# Patient Record
Sex: Male | Born: 1955 | Race: White | Hispanic: No | Smoking: Former smoker
Health system: Southern US, Community
[De-identification: ages and names within clinical notes are randomized; demographics above are authoritative.]

## PROBLEM LIST (undated history)

## (undated) DIAGNOSIS — J449 Chronic obstructive pulmonary disease, unspecified: Secondary | ICD-10-CM

## (undated) DIAGNOSIS — K219 Gastro-esophageal reflux disease without esophagitis: Secondary | ICD-10-CM

## (undated) DIAGNOSIS — C801 Malignant (primary) neoplasm, unspecified: Secondary | ICD-10-CM

## (undated) DIAGNOSIS — E785 Hyperlipidemia, unspecified: Secondary | ICD-10-CM

## (undated) DIAGNOSIS — I10 Essential (primary) hypertension: Secondary | ICD-10-CM

## (undated) DIAGNOSIS — J8410 Pulmonary fibrosis, unspecified: Secondary | ICD-10-CM

## (undated) HISTORY — PX: COLONOSCOPY: SHX174

## (undated) HISTORY — PX: CORONARY STENT PLACEMENT: SHX1402

## (undated) HISTORY — PX: SHOULDER ACROMIOPLASTY: SHX6093

---

## 2004-03-03 ENCOUNTER — Other Ambulatory Visit: Payer: Self-pay

## 2005-05-08 ENCOUNTER — Ambulatory Visit: Payer: Self-pay | Admitting: Internal Medicine

## 2005-08-04 ENCOUNTER — Emergency Department: Payer: Self-pay | Admitting: Emergency Medicine

## 2005-08-04 ENCOUNTER — Other Ambulatory Visit: Payer: Self-pay

## 2005-09-08 ENCOUNTER — Ambulatory Visit: Payer: Self-pay | Admitting: Gastroenterology

## 2006-05-07 ENCOUNTER — Ambulatory Visit: Payer: Self-pay | Admitting: Gastroenterology

## 2006-05-21 ENCOUNTER — Ambulatory Visit: Payer: Self-pay | Admitting: Gastroenterology

## 2007-11-30 ENCOUNTER — Ambulatory Visit: Payer: Self-pay | Admitting: Gastroenterology

## 2007-12-09 ENCOUNTER — Ambulatory Visit: Payer: Self-pay | Admitting: Gastroenterology

## 2008-02-20 ENCOUNTER — Emergency Department: Payer: Self-pay | Admitting: Emergency Medicine

## 2009-03-05 ENCOUNTER — Ambulatory Visit: Payer: Self-pay | Admitting: Unknown Physician Specialty

## 2009-04-17 ENCOUNTER — Ambulatory Visit: Payer: Self-pay | Admitting: Internal Medicine

## 2011-04-26 ENCOUNTER — Emergency Department: Payer: Self-pay | Admitting: Internal Medicine

## 2011-04-27 ENCOUNTER — Emergency Department: Payer: Self-pay | Admitting: Emergency Medicine

## 2012-06-27 ENCOUNTER — Emergency Department: Payer: Self-pay | Admitting: Emergency Medicine

## 2012-06-27 LAB — BASIC METABOLIC PANEL
BUN: 9 mg/dL (ref 7–18)
Calcium, Total: 9.2 mg/dL (ref 8.5–10.1)
EGFR (Non-African Amer.): >60
Glucose: 119 mg/dL — ABNORMAL HIGH (ref 65–99)
Osmolality: 266 (ref 275–301)
Potassium: 4.2 mmol/L (ref 3.5–5.1)
Sodium: 133 mmol/L — ABNORMAL LOW (ref 136–145)

## 2012-06-27 LAB — CBC
HGB: 12.2 g/dL — ABNORMAL LOW (ref 13.0–18.0)
MCH: 26.6 pg (ref 26.0–34.0)
MCHC: 33.8 g/dL (ref 32.0–36.0)
MCV: 79 fL — ABNORMAL LOW (ref 80–100)
RBC: 4.6 10*6/uL (ref 4.40–5.90)

## 2012-06-27 LAB — CK TOTAL AND CKMB (NOT AT ARMC): CK-MB: 1.2 ng/mL (ref 0.5–3.6)

## 2012-06-27 LAB — TROPONIN I: Troponin-I: <0.02 ng/mL

## 2014-02-09 ENCOUNTER — Emergency Department: Payer: Self-pay | Admitting: Emergency Medicine

## 2014-12-04 ENCOUNTER — Inpatient Hospital Stay: Payer: Self-pay | Admitting: Internal Medicine

## 2015-01-17 ENCOUNTER — Ambulatory Visit
Admit: 2015-01-17 | Disposition: A | Discharge status: Home / Self Care | Payer: Self-pay | Attending: Family Medicine | Admitting: Family Medicine

## 2015-01-23 ENCOUNTER — Ambulatory Visit
Admit: 2015-01-23 | Disposition: A | Discharge status: Home / Self Care | Payer: Self-pay | Attending: Family Medicine | Admitting: Family Medicine

## 2015-01-28 ENCOUNTER — Ambulatory Visit
Admit: 2015-01-28 | Disposition: A | Discharge status: Home / Self Care | Payer: Self-pay | Attending: Family Medicine | Admitting: Family Medicine

## 2015-02-17 NOTE — H&P (Signed)
PATIENT NAME:  Austin Patterson, Austin Patterson MR#:  [id number] DATE OF BIRTH:  12/17/1955  DATE OF ADMISSION:  12/04/2014  REFERRING PHYSICIAN:  Valli Glance. Owens Shark, MD  PRIMARY CARE PHYSICIAN:  Estell Harpin, NP  CHIEF COMPLAINT:  Shortness of breath.   HISTORY OF PRESENT ILLNESS:  This is a 59 year old Caucasian gentleman with a history of coronary artery disease status post PCI with stent placement, gastroesophageal reflux disease without esophagitis, and hypertension, essential, presenting with shortness of breath. He describes one-day duration of shortness of breath, mainly dyspnea on exertion with associated cough productive of "shitty-looking sputum." Denies fevers, chills, or further symptomatology. He is tachypneic and tachycardic upon arrival.   REVIEW OF SYSTEMS: CONSTITUTIONAL:  Denies fever; however, positive for chills, fatigue, and weakness.  EYES:  Denies blurred vision, double vision, or eye pain.  EARS, NOSE, AND THROAT:  Denies tinnitus, ear pain, or hearing loss.  RESPIRATORY:  Positive for cough and shortness of breath as described above.  CARDIOVASCULAR:  Denies chest pain, palpitations, or edema. GASTROINTESTINAL:  Denies any nausea, vomiting, diarrhea, or abdominal pain.  GENITOURINARY:  Denies dysuria or hematuria.  ENDOCRINE:  Denies nocturia or thyroid problems.  HEMATOLOGIC AND LYMPHATIC:  Denies easy bruising or bleeding.  SKIN:  Denies rash or lesion.  MUSCULOSKELETAL:  Denies pain in the neck, back, shoulders, knees, or hips or arthritic symptoms.  NEUROLOGIC:  Denies paralysis or paresthesias.  PSYCHIATRIC:  Denies anxiety or depressive symptoms.   Otherwise, full review of systems performed by me is negative.  PAST MEDICAL HISTORY:  Coronary artery disease status post PCI with stent placement, gastroesophageal reflux disease without esophagitis, and hypertension, essential.   SOCIAL HISTORY:  Positive for continued everyday tobacco use. Occasional alcohol use. Denies  any drug use.   FAMILY HISTORY:  Positive for coronary artery disease.   ALLERGIES:  No known drug allergies.   HOME MEDICATIONS:  Include atorvastatin 20 mg p.o. daily, hydrochlorothiazide/losartan 12.5/100 mg p.o. daily, Plavix 75 mg p.o. daily, Toprol-XL 50 mg p.o. daily, Norvasc 10 mg p.o. daily, and pantoprazole 40 mg p.o. daily.   PHYSICAL EXAMINATION: VITAL SIGNS:  Temperature 97.9, heart rate 114, respirations 22, blood pressure 147/79, and saturating 94% on supplemental O2. Weight 76.2 kg, BMI 26.3.  GENERAL:  Well-nourished, well-developed Caucasian gentleman currently in no acute distress.  HEAD:  Normocephalic, atraumatic.  EYES:  Pupils are equal, round, and reactive to light. Extraocular muscles are intact. No scleral icterus.  MOUTH:  Moist mucosal membranes. Dentition is intact. No abscess noted.  EARS, NOSE, AND THROAT:  Clear without exudate. No external lesions.  NECK:  Supple. No thyromegaly. No nodules. No JVD.  PULMONARY:  Coarse breath sounds with scattered rhonchi, left lower; otherwise, no frank wheezes noted. Good air entry bilaterally. Good respiratory effort.  CHEST:  Nontender to palpation.  CARDIOVASCULAR:  S1 and S2, tachycardic. No murmurs, rubs, or gallops. No edema. Pedal pulses 2+ bilaterally.  GASTROINTESTINAL:  Soft, nontender, nondistended. No masses. Positive bowel sounds. No hepatosplenomegaly.  MUSCULOSKELETAL:  No swelling, clubbing, or edema. Range of motion is full in all extremities.  NEUROLOGIC:  Cranial nerves II through XII are intact. No gross focal neurological deficits. Sensation is intact. Reflexes are intact.  SKIN:  No ulceration, lesion, rash, or cyanosis. Skin is warm and dry. Turgor is intact.  PSYCHIATRIC:  Mood and affect are within normal limits. The patient is awake, alert, and oriented x 3. Insight and judgment are intact.   LABORATORY DATA:  Sodium 130, potassium  2.9, chloride 95, bicarbonate 22, BUN 10, creatinine 1.05, glucose  117. WBC 21.8, hemoglobin 12.2, platelets 334,000. CT of the abdomen and pelvis was performed showing no acute findings. Chest x-ray performed, which reveals lingular airspace disease consistent with pneumonia.   ASSESSMENT AND PLAN:  This is a 59 year old Caucasian gentleman with a history of coronary artery disease status post percutaneous coronary intervention with stent placement and hypertension, presenting with shortness of breath.   1.  Sepsis, meeting septic criteria by heart rate, respiratory rate, and leukocytosis present on arrival secondary to community-acquired pneumonia. Blood culture as well as sputum sample. DuoNeb treatments q. 4 hours. Supplemental O2 to keep SaO2 greater than 90%. Levaquin for antibiotic coverage. Intravenous fluid hydration to keep mean arterial pressure greater than 65.  2.  Hyponatremia. Intravenous fluid hydration. Follow sodium level.  3.  Hypokalemia. Replace with goal 4 to 5. We will also check a magnesium level.  4.  Hypertension, essential. Continue with Norvasc as well as Toprol-XL.  5.  Coronary artery disease status post percutaneous coronary intervention with stent placement. Continue aspirin, statin, and Plavix as well as beta blockade.  6.  Venous thromboembolism prophylaxis with heparin subcutaneously.   CODE STATUS:  The patient is a full code.   TIME SPENT:  45 minutes.    ____________________________ Aaron Mose. Hower, MD dkh:nb D: 12/04/2014 02:48:24 ET T: 12/04/2014 03:10:44 ET JOB#: [id number]  cc: Aaron Mose. Hower, MD, <Dictator> DAVID Woodfin Ganja MD ELECTRONICALLY SIGNED 12/04/2014 20:36

## 2015-02-17 NOTE — Discharge Summary (Signed)
PATIENT NAME:  Austin Patterson, Austin Patterson MR#:  [id number] DATE OF BIRTH:  1956/05/18  DATE OF ADMISSION:  12/04/2014 DATE OF DISCHARGE:  12/05/2014  PRIMARY CARE PROVIDER: Estell Harpin, NP   ADMITTING COMPLAINT: Shortness of breath.   DISCHARGE DIAGNOSES: 1. Community-acquired pneumonia.  2. Ongoing tobacco abuse.  3. Hypertension.  4. Coronary artery disease.  5. Sepsis, resolved on discharge  6. Hypokalemia, resolved by discharge.  7. History of coronary artery disease, stable throughout hospitalization.  8. Likely chronic obstructive pulmonary disease.  9. Adrenal nodules seen on CT scan, which need follow up with CT in 12 months.  10. Left upper lobe nodular opacity seen on chest x-ray, which will need follow-up in 4-6 weeks, as well   CONSULTATIONS: None.   PROCEDURES:  1. CT abdomen and pelvis with contrast, February 16, shows no acute findings in the abdomen or pelvis. Low attenuation adrenal nodules bilaterally, more likely benign.  2. Chest x-ray shows lingular airspace disease consistent with pneumonia. Nodular opacity in left upper lung zone, which will need follow-up on further films, changes suggestive of emphysema.   HISTORY OF PRESENT ILLNESS: This 59 year old gentleman with history of coronary artery disease, status post PCI with stent placement, gastroesophageal reflux disease, hypertension, presents with shortness of breath. He describes 1-day duration of symptoms, dyspnea on exertion and cough productive of sputum. No fevers or chills.   HOSPITAL COURSE BY PROBLEM:  1. Sepsis: The patient metastatic septic criteria with heart rate, respiratory rate, and leukocytosis. This is due to community-acquired pneumonia. Blood cultures were negative. Sputum culture is not obtained. He was started on IV Levaquin and IV fluids on admission and did well. He is discharged on oral Levaquin, sepsis resolved, with normalization of his heart rate to 89 at resting and a very robust blood  pressure. Negative orthostatics prior to discharge.  2. Community-acquired pneumonia: Treated with Levaquin. At the time of discharge, white count has decreased considerably and he is afebrile. He will continue a 7-day course of Levaquin.  3. Left upper lobe nodule seen on chest x-ray: This should be followed with repeat films in 4-6 weeks and should be monitored by his primary care physician. He is at high risk for bronchogenic carcinoma given his smoking history.  4. Coronary artery disease: This was stable throughout hospitalization. He did not have chest pain. Cardiac enzymes were negative. No EKG changes seen. He continues on Plavix, statin, metoprolol, and blood pressure control with hydrochlorothiazide/losartan. He is advised to quit smoking.  5. Adrenal nodule seen on CT scan, incidental finding. Likely benign, but warrants follow-up with a repeat CT scan in 6 months.  6. Ongoing tobacco abuse: The patient was counseled regarding tobacco abuse. He states that he is ready to quit smoking, and he should continue to work with his primary care physician on this.  7. Hypertension: The blood pressure is fairly well controlled in hospital, and he is discharged on his home regimen,   DISCHARGE MEDICATIONS: 1. Atorvastatin 20 mg 1 tablet daily.  2. Hydrochlorothiazide/losartan 12.5 mg/100 mg 1 tablet once a day.  3. Clopidogrel 75 mg 1 tablet once a day.  4. Pantoprazole 40 mg 1 tablet once a day.  5. Norvasc 10 mg 1 tablet once a day.  6. Metoprolol succinate 50 mg 1 tablet once a day.  7. Levaquin 500 mg 1 tablet once a day to complete a 7-day course.   DISCHARGE PHYSICAL EXAMINATION: VITAL SIGNS: Temperature 98.1, pulse 89, respirations 20, blood pressure 151/88. Orthostatic  vital signs are negative. Oxygenation 98% at rest, 94% with exertion on room air.  GENERAL: No acute distress.  HEENT: Pupils equal, round, and reactive to light. Conjunctivae clear. Extraocular motion intact. Oral mucous  membranes pink and moist. Posterior oropharynx is clear.  RESPIRATORY: There are scattered bibasilar crackles. No wheezes, rhonchi, or rales. Good air movement. No respiratory distress.  CARDIOVASCULAR: Regular rate and rhythm. No murmurs, rubs, or gallops. No peripheral edema. Peripheral pulses 2+.  PSYCHIATRIC: The patient alert and oriented x 4 with good insight into clinical condition. No signs of uncontrolled depression or anxiety.   DISCHARGE LABORATORY DATA: Sodium 130, potassium 4.0, chloride 98, bicarbonate 29, BUN 8, creatinine 0.64, glucose 120. Total protein 6.0, albumin 3.0. LFTs normal. Troponin less than 0.02. White blood cell count initially 21.8, decreased to 14 after treatment; hemoglobin 11.4, platelets 341, 000. MCV is 98. Blood cultures: No growth x 2.   CONDITION ON DISCHARGE: Stable.   DISPOSITION: The patient is discharged to home with no further home health needs. He does not need home health oxygen.   DIET: Heart healthy diet.   ACTIVITY: As tolerated.   FOLLOWUP: Please follow up within 1-2 weeks with your primary care physician.   TIME SPENT ON DISCHARGE: 40 minutes.     ____________________________ Earleen Newport. Volanda Napoleon, MD cpw:mw D: 12/11/2014 08:50:37 ET T: 12/11/2014 11:50:30 ET JOB#: [id number]  cc: Barnetta Chapel P. Volanda Napoleon, MD, <Dictator> Estell Harpin, NP Aldean Jewett MD ELECTRONICALLY SIGNED 12/24/2014 10:42

## 2015-09-03 ENCOUNTER — Emergency Department: Payer: Managed Care, Other (non HMO)

## 2015-09-03 ENCOUNTER — Emergency Department
Admission: EM | Admit: 2015-09-03 | Discharge: 2015-09-03 | Disposition: A | Discharge status: Home / Self Care | Payer: Managed Care, Other (non HMO) | Attending: Emergency Medicine | Admitting: Emergency Medicine

## 2015-09-03 ENCOUNTER — Encounter: Payer: Self-pay | Admitting: Emergency Medicine

## 2015-09-03 DIAGNOSIS — R079 Chest pain, unspecified: Secondary | ICD-10-CM | POA: Diagnosis not present

## 2015-09-03 DIAGNOSIS — F17200 Nicotine dependence, unspecified, uncomplicated: Secondary | ICD-10-CM | POA: Insufficient documentation

## 2015-09-03 DIAGNOSIS — J441 Chronic obstructive pulmonary disease with (acute) exacerbation: Secondary | ICD-10-CM

## 2015-09-03 DIAGNOSIS — I10 Essential (primary) hypertension: Secondary | ICD-10-CM | POA: Diagnosis not present

## 2015-09-03 HISTORY — DX: Hyperlipidemia, unspecified: E78.5

## 2015-09-03 HISTORY — DX: Essential (primary) hypertension: I10

## 2015-09-03 LAB — COMPREHENSIVE METABOLIC PANEL
ALT: 22 U/L (ref 17–63)
AST: 28 U/L (ref 15–41)
Albumin: 4.3 g/dL (ref 3.5–5.0)
Alkaline Phosphatase: 56 U/L (ref 38–126)
Anion gap: 10 (ref 5–15)
BUN: 13 mg/dL (ref 6–20)
CHLORIDE: 98 mmol/L — AB (ref 101–111)
CO2: 25 mmol/L (ref 22–32)
Calcium: 9.2 mg/dL (ref 8.9–10.3)
Creatinine, Ser: 0.68 mg/dL (ref 0.61–1.24)
GFR calc Af Amer: >60 mL/min (ref >60)
GFR calc non Af Amer: >60 mL/min (ref >60)
GLUCOSE: 142 mg/dL — AB (ref 65–99)
POTASSIUM: 3.9 mmol/L (ref 3.5–5.1)
Sodium: 133 mmol/L — ABNORMAL LOW (ref 135–145)
Total Bilirubin: 1 mg/dL (ref 0.3–1.2)
Total Protein: 7.8 g/dL (ref 6.5–8.1)

## 2015-09-03 LAB — CBC WITH DIFFERENTIAL/PLATELET
Basophils Absolute: 0 10*3/uL (ref 0–0.1)
Basophils Relative: 0 %
Eosinophils Absolute: 0.2 10*3/uL (ref 0–0.7)
Eosinophils Relative: 2 %
HEMATOCRIT: 41.3 % (ref 40.0–52.0)
HEMOGLOBIN: 14 g/dL (ref 13.0–18.0)
LYMPHS PCT: 8 %
Lymphs Abs: 1 10*3/uL (ref 1.0–3.6)
MCH: 33.1 pg (ref 26.0–34.0)
MCHC: 34 g/dL (ref 32.0–36.0)
MCV: 97.5 fL (ref 80.0–100.0)
MONO ABS: 1.1 10*3/uL — AB (ref 0.2–1.0)
MONOS PCT: 9 %
NEUTROS ABS: 9.8 10*3/uL — AB (ref 1.4–6.5)
Neutrophils Relative %: 81 %
Platelets: 351 10*3/uL (ref 150–440)
RBC: 4.23 MIL/uL — ABNORMAL LOW (ref 4.40–5.90)
RDW: 16 % — AB (ref 11.5–14.5)
WBC: 12.2 10*3/uL — ABNORMAL HIGH (ref 3.8–10.6)

## 2015-09-03 LAB — TROPONIN I: Troponin I: <0.03 ng/mL (ref <0.031)

## 2015-09-03 MED ORDER — ALBUTEROL SULFATE HFA 108 (90 BASE) MCG/ACT IN AERS: 2.0000 | INHALATION_SPRAY | Freq: Four times a day (QID) | RESPIRATORY_TRACT | Status: DC | PRN

## 2015-09-03 MED ORDER — PREDNISONE 20 MG PO TABS: 40.0000 mg | ORAL_TABLET | Freq: Every day | ORAL | Status: DC

## 2015-09-03 MED ORDER — IPRATROPIUM-ALBUTEROL 0.5-2.5 (3) MG/3ML IN SOLN
3.0000 mL | Freq: Once | RESPIRATORY_TRACT | Status: AC
  Administered 2015-09-03: 3 mL via RESPIRATORY_TRACT
  Filled 2015-09-03: qty 3

## 2015-09-03 MED ORDER — METHYLPREDNISOLONE SODIUM SUCC 125 MG IJ SOLR
125.0000 mg | Freq: Once | INTRAMUSCULAR | Status: AC
  Administered 2015-09-03: 125 mg via INTRAVENOUS
  Filled 2015-09-03: qty 2

## 2015-09-03 MED ORDER — AZITHROMYCIN 250 MG PO TABS: ORAL_TABLET | ORAL | Status: AC

## 2015-09-03 NOTE — ED Provider Notes (Signed)
[facility] Emergency Department Provider Note  Time seen: 7:25 AM  I have reviewed the triage vital signs and the nursing notes.   HISTORY  Chief Complaint Chest Pain    HPI Austin Patterson is a 59 y.o. male with a past medical history of hypertension, hyperlipidemia, CAD status post stent 2009, presents with chest pain. According to the patient for the past 1 month history of having intermittent central chest pain. He states for the past 2 weeks he has also had a cough with some intermittent shortness of breath which feels like "pneumonia." The patient smokes a pack a day and states he has been more short of breath with wheeze recently. Denies home oxygen use. Describes his chest pain as mild, intermittent, gone currently. Dull/aching quality. Denies leg pain or swelling.     Past Medical History  Diagnosis Date  . Hypertension   . Hyperlipidemia     There are no active problems to display for this patient.   Past Surgical History  Procedure Laterality Date  . Coronary stent placement      No current outpatient prescriptions on file.  Allergies Review of patient's allergies indicates no known allergies.  History reviewed. No pertinent family history.  Social History Social History  Substance Use Topics  . Smoking status: Current Every Day Smoker  . Smokeless tobacco: None  . Alcohol Use: Yes    Review of Systems Constitutional: Negative for fever Cardiovascular: Positive intermittent mild chest pain. Respiratory: Negative for shortness of breath. Positive for cough 2 weeks. Gastrointestinal: Negative for abdominal pain, vomiting and diarrhea. Musculoskeletal: Negative for back pain. Denies leg pain or swelling. Neurological: Negative for headaches, focal weakness or numbness. 10-point ROS otherwise negative.  ____________________________________________   PHYSICAL EXAM:  VITAL SIGNS: ED Triage Vitals  Enc Vitals Group   BP 09/03/15 0704 154/79 mmHg     Pulse Rate 09/03/15 0704 101     Resp 09/03/15 0704 20     Temp 09/03/15 0704 98.1 F (36.7 C)     Temp Source 09/03/15 0704 Oral     SpO2 09/03/15 0704 93 %     Weight 09/03/15 0704 160 lb (72.576 kg)     Height 09/03/15 0704 5\' 7"  (1.702 m)     Head Cir --      Peak Flow --      Pain Score 09/03/15 0705 4     Pain Loc --      Pain Edu? --      Excl. in [place]? --    Constitutional: Alert and oriented. Well appearing and in no distress. Eyes: Normal exam ENT   Head: Normocephalic and atraumatic.   Mouth/Throat: Mucous membranes are moist. Cardiovascular: Normal rate, regular rhythm. No murmur Respiratory: Normal respiratory effort without tachypnea nor retractions. Moderate expiratory wheeze bilaterally. No rales or rhonchi. Gastrointestinal: Soft and nontender. No distention.  Musculoskeletal: Nontender with normal range of motion in all extremities.  Neurologic:  Normal speech and language. No gross focal neurologic deficits Skin:  Skin is warm, dry and intact.  Psychiatric: Mood and affect are normal. Speech and behavior are normal.   ____________________________________________    EKG  EKG reviewed and interpreted by myself shows normal sinus rhythm at 98 bpm, narrow QRS, normal axis, normal intervals, nonspecific ST changes present. No ST elevations noted.  ____________________________________________    RADIOLOGY  Chest x-ray shows no acute abnormality.  ____________________________________________   INITIAL IMPRESSION / ASSESSMENT AND PLAN / ED COURSE  Pertinent labs & imaging results that were available during my care of the patient were reviewed by me and considered in my medical decision making (see chart for details).  Patient presents with 1 month of intermittent chest pain, 2 weeks of cough, shortness of breath and wheeze. Exam is most consistent with a COPD exacerbation. We will start doing them's, Solu-Medrol,  obtain labs and a chest x-ray. Patient currently with a heart rate around 100 bpm and a pulse ox of 93% on room air. EKG shows nonspecific changes.  Chest x-ray clear. Labs are within normal limits besides a mild leukocytosis. Troponin is negative. Patient feeling much better status post nebulizer treatments. Patient likely with COPD exacerbation causing his symptoms. I discussed with the patient steroids and albuterol at home along with a Z-Pak. Patient is to follow-up with Dr. Nehemiah Massed in the next week. Patient is agreeable to plan.  ____________________________________________   FINAL CLINICAL IMPRESSION(S) / ED DIAGNOSES  Chest pain COPD exacerbation   Harvest Dark, MD 09/03/15 [phone]

## 2015-09-03 NOTE — ED Notes (Signed)
Pt to ed with c/o intermittent chest pain for the last month,  Reports sob, nausea, and dizziness associated with the pain.  Pt reports pain is in central chest.

## 2015-09-03 NOTE — Discharge Instructions (Signed)
Please take your medications as prescribed. Please follow-up with Dr. Nehemiah Massed by calling the number provided for a follow-up appointment as soon as possible. Return to the emergency department for any worsening chest pain, shortness breath, or any other symptom personally concerning to yourself.   Nonspecific Chest Pain  Chest pain can be caused by many different conditions. There is always a chance that your pain could be related to something serious, such as a heart attack or a blood clot in your lungs. Chest pain can also be caused by conditions that are not life-threatening. If you have chest pain, it is very important to follow up with your health care provider. CAUSES  Chest pain can be caused by:  Heartburn.  Pneumonia or bronchitis.  Anxiety or stress.  Inflammation around your heart (pericarditis) or lung (pleuritis or pleurisy).  A blood clot in your lung.  A collapsed lung (pneumothorax). It can develop suddenly on its own (spontaneous pneumothorax) or from trauma to the chest.  Shingles infection (varicella-zoster virus).  Heart attack.  Damage to the bones, muscles, and cartilage that make up your chest wall. This can include:  Bruised bones due to injury.  Strained muscles or cartilage due to frequent or repeated coughing or overwork.  Fracture to one or more ribs.  Sore cartilage due to inflammation (costochondritis). RISK FACTORS  Risk factors for chest pain may include:  Activities that increase your risk for trauma or injury to your chest.  Respiratory infections or conditions that cause frequent coughing.  Medical conditions or overeating that can cause heartburn.  Heart disease or family history of heart disease.  Conditions or health behaviors that increase your risk of developing a blood clot.  Having had chicken pox (varicella zoster). SIGNS AND SYMPTOMS Chest pain can feel like:  Burning or tingling on the surface of your chest or deep in  your chest.  Crushing, pressure, aching, or squeezing pain.  Dull or sharp pain that is worse when you move, cough, or take a deep breath.  Pain that is also felt in your back, neck, shoulder, or arm, or pain that spreads to any of these areas. Your chest pain may come and go, or it may stay constant. DIAGNOSIS Lab tests or other studies may be needed to find the cause of your pain. Your health care provider may have you take a test called an ambulatory ECG (electrocardiogram). An ECG records your heartbeat patterns at the time the test is performed. You may also have other tests, such as:  Transthoracic echocardiogram (TTE). During echocardiography, sound waves are used to create a picture of all of the heart structures and to look at how blood flows through your heart.  Transesophageal echocardiogram (TEE).This is a more advanced imaging test that obtains images from inside your body. It allows your health care provider to see your heart in finer detail.  Cardiac monitoring. This allows your health care provider to monitor your heart rate and rhythm in real time.  Holter monitor. This is a portable device that records your heartbeat and can help to diagnose abnormal heartbeats. It allows your health care provider to track your heart activity for several days, if needed.  Stress tests. These can be done through exercise or by taking medicine that makes your heart beat more quickly.  Blood tests.  Imaging tests. TREATMENT  Your treatment depends on what is causing your chest pain. Treatment may include:  Medicines. These may include:  Acid blockers for heartburn.  Anti-inflammatory  medicine.  Pain medicine for inflammatory conditions.  Antibiotic medicine, if an infection is present.  Medicines to dissolve blood clots.  Medicines to treat coronary artery disease.  Supportive care for conditions that do not require medicines. This may include:  Resting.  Applying heat or  cold packs to injured areas.  Limiting activities until pain decreases. HOME CARE INSTRUCTIONS 1. If you were prescribed an antibiotic medicine, finish it all even if you start to feel better. 2. Avoid any activities that bring on chest pain. 3. Do not use any tobacco products, including cigarettes, chewing tobacco, or electronic cigarettes. If you need help quitting, ask your health care provider. 4. Do not drink alcohol. 5. Take medicines only as directed by your health care provider. 6. Keep all follow-up visits as directed by your health care provider. This is important. This includes any further testing if your chest pain does not go away. 7. If heartburn is the cause for your chest pain, you may be told to keep your head raised (elevated) while sleeping. This reduces the chance that acid will go from your stomach into your esophagus. 8. Make lifestyle changes as directed by your health care provider. These may include: 1. Getting regular exercise. Ask your health care provider to suggest some activities that are safe for you. 2. Eating a heart-healthy diet. A registered dietitian can help you to learn healthy eating options. 3. Maintaining a healthy weight. 4. Managing diabetes, if necessary. 5. Reducing stress. SEEK MEDICAL CARE IF:  Your chest pain does not go away after treatment.  You have a rash with blisters on your chest.  You have a fever. SEEK IMMEDIATE MEDICAL CARE IF:   Your chest pain is worse.  You have an increasing cough, or you cough up blood.  You have severe abdominal pain.  You have severe weakness.  You faint.  You have chills.  You have sudden, unexplained chest discomfort.  You have sudden, unexplained discomfort in your arms, back, neck, or jaw.  You have shortness of breath at any time.  You suddenly start to sweat, or your skin gets clammy.  You feel nauseous or you vomit.  You suddenly feel light-headed or dizzy.  Your heart begins to  beat quickly, or it feels like it is skipping beats. These symptoms may represent a serious problem that is an emergency. Do not wait to see if the symptoms will go away. Get medical help right away. Call your local emergency services (911 in the [place] Do not drive yourself to the hospital.   This information is not intended to replace advice given to you by your health care provider. Make sure you discuss any questions you have with your health care provider.   Document Released: 07/15/2005 Document Revised: 10/26/2014 Document Reviewed: 05/11/2014 Elsevier Interactive Patient Education 2016 [employer]  Chronic Obstructive Pulmonary Disease Chronic obstructive pulmonary disease (COPD) is a common lung condition in which airflow from the lungs is limited. COPD is a general term that can be used to describe many different lung problems that limit airflow, including both chronic bronchitis and emphysema. If you have COPD, your lung function will probably never return to normal, but there are measures you can take to improve lung function and make yourself feel better. CAUSES   Smoking (common).  Exposure to secondhand smoke.  Genetic problems.  Chronic inflammatory lung diseases or recurrent infections. SYMPTOMS  Shortness of breath, especially with physical activity.  Deep, persistent (chronic) cough with a large  amount of thick mucus.  Wheezing.  Rapid breaths (tachypnea).  Gray or bluish discoloration (cyanosis) of the skin, especially in your fingers, toes, or lips.  Fatigue.  Weight loss.  Frequent infections or episodes when breathing symptoms become much worse (exacerbations).  Chest tightness. DIAGNOSIS Your health care provider will take a medical history and perform a physical examination to diagnose COPD. Additional tests for COPD may include:  Lung (pulmonary) function tests.  Chest X-ray.  CT scan.  Blood tests. TREATMENT  Treatment for COPD may  include:  Inhaler and nebulizer medicines. These help manage the symptoms of COPD and make your breathing more comfortable.  Supplemental oxygen. Supplemental oxygen is only helpful if you have a low oxygen level in your blood.  Exercise and physical activity. These are beneficial for nearly all people with COPD.  Lung surgery or transplant.  Nutrition therapy to gain weight, if you are underweight.  Pulmonary rehabilitation. This may involve working with a team of health care providers and specialists, such as respiratory, occupational, and physical therapists. HOME CARE INSTRUCTIONS  Take all medicines (inhaled or pills) as directed by your health care provider.  Avoid over-the-counter medicines or cough syrups that dry up your airway (such as antihistamines) and slow down the elimination of secretions unless instructed otherwise by your health care provider.  If you are a smoker, the most important thing that you can do is stop smoking. Continuing to smoke will cause further lung damage and breathing trouble. Ask your health care provider for help with quitting smoking. He or she can direct you to community resources or hospitals that provide support.  Avoid exposure to irritants such as smoke, chemicals, and fumes that aggravate your breathing.  Use oxygen therapy and pulmonary rehabilitation if directed by your health care provider. If you require home oxygen therapy, ask your health care provider whether you should purchase a pulse oximeter to measure your oxygen level at home.  Avoid contact with individuals who have a contagious illness.  Avoid extreme temperature and humidity changes.  Eat healthy foods. Eating smaller, more frequent meals and resting before meals may help you maintain your strength.  Stay active, but balance activity with periods of rest. Exercise and physical activity will help you maintain your ability to do things you want to do.  Preventing infection  and hospitalization is very important when you have COPD. Make sure to receive all the vaccines your health care provider recommends, especially the pneumococcal and influenza vaccines. Ask your health care provider whether you need a pneumonia vaccine.  Learn and use relaxation techniques to manage stress.  Learn and use controlled breathing techniques as directed by your health care provider. Controlled breathing techniques include:  Pursed lip breathing. Start by breathing in (inhaling) through your nose for 1 second. Then, purse your lips as if you were going to whistle and breathe out (exhale) through the pursed lips for 2 seconds.  Diaphragmatic breathing. Start by putting one hand on your abdomen just above your waist. Inhale slowly through your nose. The hand on your abdomen should move out. Then purse your lips and exhale slowly. You should be able to feel the hand on your abdomen moving in as you exhale.  Learn and use controlled coughing to clear mucus from your lungs. Controlled coughing is a series of short, progressive coughs. The steps of controlled coughing are: 9. Lean your head slightly forward. 10. Breathe in deeply using diaphragmatic breathing. 11. Try to hold your breath for  3 seconds. 12. Keep your mouth slightly open while coughing twice. 13. Spit any mucus out into a tissue. 14. Rest and repeat the steps once or twice as needed. SEEK MEDICAL CARE IF:  You are coughing up more mucus than usual.  There is a change in the color or thickness of your mucus.  Your breathing is more labored than usual.  Your breathing is faster than usual. SEEK IMMEDIATE MEDICAL CARE IF:  You have shortness of breath while you are resting.  You have shortness of breath that prevents you from:  Being able to talk.  Performing your usual physical activities.  You have chest pain lasting longer than 5 minutes.  Your skin color is more cyanotic than usual.  You measure low oxygen  saturations for longer than 5 minutes with a pulse oximeter. MAKE SURE YOU:  Understand these instructions.  Will watch your condition.  Will get help right away if you are not doing well or get worse.   This information is not intended to replace advice given to you by your health care provider. Make sure you discuss any questions you have with your health care provider.   Document Released: 07/15/2005 Document Revised: 10/26/2014 Document Reviewed: 06/01/2013 Elsevier Interactive Patient Education [employer].

## 2015-10-14 ENCOUNTER — Inpatient Hospital Stay
Admission: EM | Admit: 2015-10-14 | Discharge: 2015-10-16 | DRG: 190 | Disposition: A | Discharge status: Home / Self Care | Payer: Managed Care, Other (non HMO) | Attending: Internal Medicine | Admitting: Internal Medicine

## 2015-10-14 ENCOUNTER — Emergency Department: Payer: Managed Care, Other (non HMO)

## 2015-10-14 DIAGNOSIS — J441 Chronic obstructive pulmonary disease with (acute) exacerbation: Principal | ICD-10-CM | POA: Diagnosis present

## 2015-10-14 DIAGNOSIS — F17210 Nicotine dependence, cigarettes, uncomplicated: Secondary | ICD-10-CM | POA: Diagnosis present

## 2015-10-14 DIAGNOSIS — Z7902 Long term (current) use of antithrombotics/antiplatelets: Secondary | ICD-10-CM | POA: Diagnosis not present

## 2015-10-14 DIAGNOSIS — J9601 Acute respiratory failure with hypoxia: Secondary | ICD-10-CM | POA: Diagnosis present

## 2015-10-14 DIAGNOSIS — Z955 Presence of coronary angioplasty implant and graft: Secondary | ICD-10-CM

## 2015-10-14 DIAGNOSIS — I10 Essential (primary) hypertension: Secondary | ICD-10-CM | POA: Diagnosis present

## 2015-10-14 DIAGNOSIS — E876 Hypokalemia: Secondary | ICD-10-CM | POA: Diagnosis present

## 2015-10-14 DIAGNOSIS — E785 Hyperlipidemia, unspecified: Secondary | ICD-10-CM | POA: Diagnosis present

## 2015-10-14 DIAGNOSIS — Z7982 Long term (current) use of aspirin: Secondary | ICD-10-CM

## 2015-10-14 DIAGNOSIS — Z23 Encounter for immunization: Secondary | ICD-10-CM

## 2015-10-14 DIAGNOSIS — E871 Hypo-osmolality and hyponatremia: Secondary | ICD-10-CM | POA: Diagnosis present

## 2015-10-14 DIAGNOSIS — Z79899 Other long term (current) drug therapy: Secondary | ICD-10-CM

## 2015-10-14 LAB — BASIC METABOLIC PANEL
Anion gap: 12 (ref 5–15)
BUN: 12 mg/dL (ref 6–20)
CO2: 27 mmol/L (ref 22–32)
Calcium: 8.8 mg/dL — ABNORMAL LOW (ref 8.9–10.3)
Chloride: 90 mmol/L — ABNORMAL LOW (ref 101–111)
Creatinine, Ser: 0.79 mg/dL (ref 0.61–1.24)
GFR calc Af Amer: >60 mL/min (ref >60)
GLUCOSE: 128 mg/dL — AB (ref 65–99)
POTASSIUM: 3.2 mmol/L — AB (ref 3.5–5.1)
Sodium: 129 mmol/L — ABNORMAL LOW (ref 135–145)

## 2015-10-14 LAB — CBC WITH DIFFERENTIAL/PLATELET
Basophils Absolute: 0.1 10*3/uL (ref 0–0.1)
Basophils Relative: 1 %
EOS PCT: 0 %
Eosinophils Absolute: 0 10*3/uL (ref 0–0.7)
HCT: 40.4 % (ref 40.0–52.0)
Hemoglobin: 14 g/dL (ref 13.0–18.0)
LYMPHS ABS: 0.6 10*3/uL — AB (ref 1.0–3.6)
LYMPHS PCT: 6 %
MCH: 33.5 pg (ref 26.0–34.0)
MCHC: 34.7 g/dL (ref 32.0–36.0)
MCV: 96.4 fL (ref 80.0–100.0)
MONO ABS: 0.9 10*3/uL (ref 0.2–1.0)
MONOS PCT: 9 %
Neutro Abs: 8.4 10*3/uL — ABNORMAL HIGH (ref 1.4–6.5)
Neutrophils Relative %: 84 %
PLATELETS: 268 10*3/uL (ref 150–440)
RBC: 4.19 MIL/uL — AB (ref 4.40–5.90)
RDW: 14.4 % (ref 11.5–14.5)
WBC: 10 10*3/uL (ref 3.8–10.6)

## 2015-10-14 LAB — BRAIN NATRIURETIC PEPTIDE: B Natriuretic Peptide: 43 pg/mL (ref 0.0–100.0)

## 2015-10-14 LAB — TROPONIN I: Troponin I: <0.03 ng/mL (ref <0.031)

## 2015-10-14 MED ORDER — ALBUTEROL SULFATE (2.5 MG/3ML) 0.083% IN NEBU
2.5000 mg | INHALATION_SOLUTION | RESPIRATORY_TRACT | Status: DC | PRN
Start: 2015-10-14 — End: 2015-10-16

## 2015-10-14 MED ORDER — ONDANSETRON HCL 4 MG/2ML IJ SOLN: 4.0000 mg | Freq: Four times a day (QID) | INTRAMUSCULAR | Status: DC | PRN

## 2015-10-14 MED ORDER — ACETAMINOPHEN 325 MG PO TABS
650.0000 mg | ORAL_TABLET | Freq: Four times a day (QID) | ORAL | Status: DC | PRN
  Administered 2015-10-14 – 2015-10-15 (×2): 650 mg via ORAL
  Filled 2015-10-14 (×2): qty 2

## 2015-10-14 MED ORDER — CLOPIDOGREL BISULFATE 75 MG PO TABS
75.0000 mg | ORAL_TABLET | Freq: Every day | ORAL | Status: DC
  Administered 2015-10-15 – 2015-10-16 (×2): 75 mg via ORAL
  Filled 2015-10-14 (×2): qty 1

## 2015-10-14 MED ORDER — LOSARTAN POTASSIUM 50 MG PO TABS
100.0000 mg | ORAL_TABLET | Freq: Every day | ORAL | Status: DC
  Administered 2015-10-16: 100 mg via ORAL
  Filled 2015-10-14 (×2): qty 2

## 2015-10-14 MED ORDER — ATORVASTATIN CALCIUM 20 MG PO TABS
40.0000 mg | ORAL_TABLET | Freq: Every day | ORAL | Status: DC
  Administered 2015-10-14 – 2015-10-15 (×2): 40 mg via ORAL
  Filled 2015-10-14 (×2): qty 2

## 2015-10-14 MED ORDER — NICOTINE 21 MG/24HR TD PT24
21.0000 mg | MEDICATED_PATCH | Freq: Every day | TRANSDERMAL | Status: DC
  Administered 2015-10-14 – 2015-10-16 (×3): 21 mg via TRANSDERMAL
  Filled 2015-10-14 (×3): qty 1

## 2015-10-14 MED ORDER — IPRATROPIUM-ALBUTEROL 0.5-2.5 (3) MG/3ML IN SOLN
3.0000 mL | Freq: Four times a day (QID) | RESPIRATORY_TRACT | Status: DC
  Administered 2015-10-14 – 2015-10-15 (×6): 3 mL via RESPIRATORY_TRACT
  Filled 2015-10-14 (×6): qty 3

## 2015-10-14 MED ORDER — PANTOPRAZOLE SODIUM 40 MG PO TBEC
40.0000 mg | DELAYED_RELEASE_TABLET | Freq: Every day | ORAL | Status: DC
  Administered 2015-10-15 – 2015-10-16 (×2): 40 mg via ORAL
  Filled 2015-10-14 (×2): qty 1

## 2015-10-14 MED ORDER — ENOXAPARIN SODIUM 40 MG/0.4ML ~~LOC~~ SOLN
40.0000 mg | SUBCUTANEOUS | Status: DC
  Administered 2015-10-14 – 2015-10-15 (×2): 40 mg via SUBCUTANEOUS
  Filled 2015-10-14 (×2): qty 0.4

## 2015-10-14 MED ORDER — LOSARTAN POTASSIUM-HCTZ 100-12.5 MG PO TABS: 1.0000 | ORAL_TABLET | Freq: Every day | ORAL | Status: DC

## 2015-10-14 MED ORDER — IPRATROPIUM-ALBUTEROL 0.5-2.5 (3) MG/3ML IN SOLN
3.0000 mL | Freq: Once | RESPIRATORY_TRACT | Status: AC
  Administered 2015-10-14: 3 mL via RESPIRATORY_TRACT
  Filled 2015-10-14: qty 3

## 2015-10-14 MED ORDER — ASPIRIN EC 81 MG PO TBEC
81.0000 mg | DELAYED_RELEASE_TABLET | Freq: Every day | ORAL | Status: DC
  Administered 2015-10-15 – 2015-10-16 (×2): 81 mg via ORAL
  Filled 2015-10-14 (×2): qty 1

## 2015-10-14 MED ORDER — POTASSIUM CHLORIDE 20 MEQ PO PACK
40.0000 meq | PACK | Freq: Once | ORAL | Status: AC
  Administered 2015-10-14: 40 meq via ORAL
  Filled 2015-10-14: qty 2

## 2015-10-14 MED ORDER — SODIUM CHLORIDE 0.9 % IV SOLN
INTRAVENOUS | Status: AC
  Administered 2015-10-14: 14:00:00 via INTRAVENOUS

## 2015-10-14 MED ORDER — SODIUM CHLORIDE 0.9 % IJ SOLN
3.0000 mL | Freq: Two times a day (BID) | INTRAMUSCULAR | Status: DC
  Administered 2015-10-14 – 2015-10-16 (×4): 3 mL via INTRAVENOUS

## 2015-10-14 MED ORDER — METHYLPREDNISOLONE SODIUM SUCC 125 MG IJ SOLR
125.0000 mg | Freq: Once | INTRAMUSCULAR | Status: AC
  Administered 2015-10-14: 125 mg via INTRAVENOUS
  Filled 2015-10-14: qty 2

## 2015-10-14 MED ORDER — ACETAMINOPHEN 650 MG RE SUPP: 650.0000 mg | Freq: Four times a day (QID) | RECTAL | Status: DC | PRN

## 2015-10-14 MED ORDER — AZITHROMYCIN 250 MG PO TABS
250.0000 mg | ORAL_TABLET | Freq: Every day | ORAL | Status: DC
  Administered 2015-10-15 – 2015-10-16 (×2): 250 mg via ORAL
  Filled 2015-10-14 (×2): qty 1

## 2015-10-14 MED ORDER — METOPROLOL SUCCINATE ER 50 MG PO TB24
50.0000 mg | ORAL_TABLET | Freq: Every day | ORAL | Status: DC
  Administered 2015-10-15 – 2015-10-16 (×2): 50 mg via ORAL
  Filled 2015-10-14 (×2): qty 1

## 2015-10-14 MED ORDER — INFLUENZA VAC SPLIT QUAD 0.5 ML IM SUSY
0.5000 mL | PREFILLED_SYRINGE | INTRAMUSCULAR | Status: AC
  Administered 2015-10-15: 0.5 mL via INTRAMUSCULAR
  Filled 2015-10-14: qty 0.5

## 2015-10-14 MED ORDER — DOCUSATE SODIUM 100 MG PO CAPS
100.0000 mg | ORAL_CAPSULE | Freq: Two times a day (BID) | ORAL | Status: DC
  Administered 2015-10-14 – 2015-10-15 (×2): 100 mg via ORAL
  Filled 2015-10-14 (×4): qty 1

## 2015-10-14 MED ORDER — AMLODIPINE BESYLATE 10 MG PO TABS
10.0000 mg | ORAL_TABLET | Freq: Every day | ORAL | Status: DC
  Administered 2015-10-15 – 2015-10-16 (×2): 10 mg via ORAL
  Filled 2015-10-14 (×2): qty 1

## 2015-10-14 MED ORDER — ONDANSETRON HCL 4 MG PO TABS: 4.0000 mg | ORAL_TABLET | Freq: Four times a day (QID) | ORAL | Status: DC | PRN

## 2015-10-14 MED ORDER — DEXTROSE 5 % IV SOLN
500.0000 mg | Freq: Once | INTRAVENOUS | Status: AC
  Administered 2015-10-14: 500 mg via INTRAVENOUS
  Filled 2015-10-14 (×3): qty 500

## 2015-10-14 MED ORDER — HYDROCHLOROTHIAZIDE 12.5 MG PO CAPS
12.5000 mg | ORAL_CAPSULE | Freq: Every day | ORAL | Status: DC
  Administered 2015-10-16: 12.5 mg via ORAL
  Filled 2015-10-14 (×2): qty 1

## 2015-10-14 MED ORDER — METHYLPREDNISOLONE SODIUM SUCC 125 MG IJ SOLR
60.0000 mg | Freq: Three times a day (TID) | INTRAMUSCULAR | Status: DC
  Administered 2015-10-14 – 2015-10-15 (×3): 60 mg via INTRAVENOUS
  Filled 2015-10-14 (×3): qty 2

## 2015-10-14 MED ORDER — PNEUMOCOCCAL VAC POLYVALENT 25 MCG/0.5ML IJ INJ
0.5000 mL | INJECTION | INTRAMUSCULAR | Status: AC
  Administered 2015-10-15: 0.5 mL via INTRAMUSCULAR
  Filled 2015-10-14: qty 0.5

## 2015-10-14 NOTE — ED Provider Notes (Signed)
[facility] Emergency Department Provider Note     Time seen: ----------------------------------------- 8:48 AM on 10/14/2015 -----------------------------------------    I have reviewed the triage vital signs and the nursing notes.   HISTORY  Chief Complaint No chief complaint on file.    HPI Austin Patterson is a 59 y.o. male brought the ER for shortness of breath. Patient states shortness of breath started this morning, has had wheezing and has never been this bad before. He is brought in tachycardic with hypoxia. Patient denies fever but has had chills, denies chest pain but has marked shortness of breath. Denies any other complaints.   Past Medical History  Diagnosis Date  . Hypertension   . Hyperlipidemia     There are no active problems to display for this patient.   Past Surgical History  Procedure Laterality Date  . Coronary stent placement      Allergies Review of patient's allergies indicates no known allergies.  Social History Social History  Substance Use Topics  . Smoking status: Current Every Day Smoker  . Smokeless tobacco: Not on file  . Alcohol Use: Yes    Review of Systems Constitutional: Negative for fever. Positive for chills Eyes: Negative for visual changes. ENT: Negative for sore throat. Cardiovascular: Negative for chest pain. Respiratory: Positive shortness of breath, positive for cough Gastrointestinal: Negative for abdominal pain, vomiting and diarrhea. Genitourinary: Negative for dysuria. Musculoskeletal: Negative for back pain. Skin: Negative for rash. Neurological: Negative for headaches, focal weakness or numbness.  10-point ROS otherwise negative.  ____________________________________________   PHYSICAL EXAM:  VITAL SIGNS: ED Triage Vitals  Enc Vitals Group     BP --      Pulse --      Resp --      Temp --      Temp src --      SpO2 --      Weight --      Height --      Head Cir  --      Peak Flow --      Pain Score --      Pain Loc --      Pain Edu? --      Excl. in [place]? --     Constitutional: Alert and oriented. Mild to moderate distress Eyes: Conjunctivae are normal. PERRL. Normal extraocular movements. ENT   Head: Normocephalic and atraumatic.   Nose: No congestion/rhinnorhea.   Mouth/Throat: Mucous membranes are moist.   Neck: No stridor. Cardiovascular: Normal rate, regular rhythm. Normal and symmetric distal pulses are present in all extremities. No murmurs, rubs, or gallops. Respiratory: Tachypnea, prolonged expirations, retractions, diminished breath sounds, wheezing bilaterally. Gastrointestinal: Soft and nontender. No distention. No abdominal bruits.  Musculoskeletal: Nontender with normal range of motion in all extremities. No joint effusions.  No lower extremity tenderness nor edema. Neurologic:  Normal speech and language. No gross focal neurologic deficits are appreciated. Speech is normal. No gait instability. Skin:  Skin is warm, dry and intact. No rash noted. Psychiatric: Mood and affect are normal. Speech and behavior are normal. Patient exhibits appropriate insight and judgment. ____________________________________________  EKG: Interpreted by me. Sinus tachycardia with a rate of 121 bpm, left anterior fascicular block, left axis deviation. No evidence of acute infarction  ____________________________________________  ED COURSE:  Pertinent labs & imaging results that were available during my care of the patient were reviewed by me and considered in my medical decision making (see chart for details). Patient  with apparent COPD exacerbation. He'll be given DuoNebs and steroids. ____________________________________________    LABS (pertinent positives/negatives)  Labs Reviewed  CBC WITH DIFFERENTIAL/PLATELET - Abnormal; Notable for the following:    RBC 4.19 (*)    Neutro Abs 8.4 (*)    Lymphs Abs 0.6 (*)    All other  components within normal limits  BASIC METABOLIC PANEL - Abnormal; Notable for the following:    Sodium 129 (*)    Potassium 3.2 (*)    Chloride 90 (*)    Glucose, Bld 128 (*)    Calcium 8.8 (*)    All other components within normal limits  BLOOD GAS, VENOUS - Abnormal; Notable for the following:    pH, Ven 7.45 (*)    pCO2, Ven 43 (*)    Bicarbonate 29.9 (*)    Acid-Base Excess 5.2 (*)    All other components within normal limits  BRAIN NATRIURETIC PEPTIDE  TROPONIN I    RADIOLOGY Images were viewed by me  Chest x-ray IMPRESSION: No infiltrate or pulmonary edema. Mild hyperinflation. Stable scarring in right base laterally and lingula. ____________________________________________  FINAL ASSESSMENT AND PLAN  COPD exacerbation, hyponatremia  Plan: Patient with labs and imaging as dictated above. Patient is in no acute distress, has mild hypoxia around 88% without any oxygen. He is still wheezing after 3 DuoNeb since steroids. I've advised observation for COPD exacerbation. He also has mild hyponatremia.   Earleen Newport, MD   Earleen Newport, MD 10/14/15 [id number]

## 2015-10-14 NOTE — H&P (Signed)
[facility] [place] NAME: Austin Patterson    MR#:  [id number]  DATE OF BIRTH:  11/28/1955  DATE OF ADMISSION:  10/14/2015  PRIMARY CARE PHYSICIAN: Lavonne Chick, MD   REQUESTING/REFERRING PHYSICIAN: Dr. Jimmye Norman  CHIEF COMPLAINT:  Shortness of breath  HISTORY OF PRESENT ILLNESS:  Austin Patterson  is a 59 y.o. male with a known history of COPD, hypertension and hyperlipidemia is presenting to the ED with a chief complaint of shortness of breath. Patient suddenly started having worsening of shortness of breath associated with chills but no fever. Patient was found to be hypoxic with pulse ox at 88% on room air. Chest x-ray with no acute infiltrate. Sodium and potassium was found to be low. Patient smokes but reports that he stop smoking since last night and wants to quit forever  PAST MEDICAL HISTORY:   Past Medical History  Diagnosis Date  . Hypertension   . Hyperlipidemia     PAST SURGICAL HISTOIRY:   Past Surgical History  Procedure Laterality Date  . Coronary stent placement    . Shoulder acromioplasty      SOCIAL HISTORY:   Social History  Substance Use Topics  . Smoking status: Current Every Day Smoker  . Smokeless tobacco: Not on file  . Alcohol Use: Yes     Comment: occasional     FAMILY HISTORY:  Hypertension runs in his family DRUG ALLERGIES:  No Known Allergies  REVIEW OF SYSTEMS:  CONSTITUTIONAL: No fever, but reporting chills. Denies fatigue or weakness.  EYES: No blurred or double vision.  EARS, NOSE, AND THROAT: No tinnitus or ear pain.  RESPIRATORY: Reports cough, shortness of breath, wheezing , denies hemoptysis.  CARDIOVASCULAR: No chest pain, orthopnea, edema.  GASTROINTESTINAL: No nausea, vomiting, diarrhea or abdominal pain.  GENITOURINARY: No dysuria, hematuria.  ENDOCRINE: No polyuria, nocturia,  HEMATOLOGY: No anemia, easy bruising or bleeding SKIN: No rash or  lesion. MUSCULOSKELETAL: No joint pain or arthritis.   NEUROLOGIC: No tingling, numbness, weakness.  PSYCHIATRY: No anxiety or depression.   MEDICATIONS AT HOME:   Prior to Admission medications   Medication Sig Start Date End Date Taking? Authorizing Provider  albuterol (PROVENTIL HFA;VENTOLIN HFA) 108 (90 BASE) MCG/ACT inhaler Inhale 2 puffs into the lungs every 6 (six) hours as needed for wheezing or shortness of breath. 09/03/15  Yes Harvest Dark, MD  amLODipine (NORVASC) 10 MG tablet Take 10 mg by mouth daily.   Yes Historical Provider, MD  aspirin EC 81 MG tablet Take 81 mg by mouth daily.   Yes Historical Provider, MD  atorvastatin (LIPITOR) 40 MG tablet Take 40 mg by mouth at bedtime.   Yes Historical Provider, MD  clopidogrel (PLAVIX) 75 MG tablet Take 75 mg by mouth daily.   Yes Historical Provider, MD  losartan-hydrochlorothiazide (HYZAAR) 100-12.5 MG tablet Take 1 tablet by mouth daily.   Yes Historical Provider, MD  metoprolol succinate (TOPROL-XL) 50 MG 24 hr tablet Take 50 mg by mouth daily.   Yes Historical Provider, MD  pantoprazole (PROTONIX) 40 MG tablet Take 40 mg by mouth daily.   Yes Historical Provider, MD      VITAL SIGNS:  Blood pressure 107/60, pulse 98, temperature 99.9 F (37.7 C), temperature source Oral, resp. rate 20, SpO2 92 %.  PHYSICAL EXAMINATION:  GENERAL:  59 y.o.-year-old patient lying in the bed with no acute distress.  EYES: Pupils equal, round, reactive to light and accommodation. No scleral icterus. Extraocular  muscles intact.  HEENT: Head atraumatic, normocephalic. Oropharynx and nasopharynx clear.  NECK:  Supple, no jugular venous distention. No thyroid enlargement, no tenderness.  LUNGS: Coarse bronchial breath sounds bilaterally, minimal wheezing, no rales,rhonchi or crepitation. No use of accessory muscles of respiration.  CARDIOVASCULAR: S1, S2 normal. No murmurs, rubs, or gallops.  ABDOMEN: Soft, nontender, nondistended. Bowel  sounds present. No organomegaly or mass.  EXTREMITIES: No pedal edema, cyanosis, or clubbing.  NEUROLOGIC: Cranial nerves II through XII are intact. Muscle strength 5/5 in all extremities. Sensation intact. Gait not checked.  PSYCHIATRIC: The patient is alert and oriented x 3.  SKIN: No obvious rash, lesion, or ulcer.   LABORATORY PANEL:   CBC  Recent Labs Lab 10/14/15 0859  WBC 10.0  HGB 14.0  HCT 40.4  PLT 268   ------------------------------------------------------------------------------------------------------------------  Chemistries   Recent Labs Lab 10/14/15 0859  NA 129*  K 3.2*  CL 90*  CO2 27  GLUCOSE 128*  BUN 12  CREATININE 0.79  CALCIUM 8.8*   ------------------------------------------------------------------------------------------------------------------  Cardiac Enzymes  Recent Labs Lab 10/14/15 0859  TROPONINI <0.03   ------------------------------------------------------------------------------------------------------------------  RADIOLOGY:  Dg Chest Port 1 View  10/14/2015  CLINICAL DATA:  Shortness of breath starting 4 days ago EXAM: PORTABLE CHEST 1 VIEW COMPARISON:  09/03/2015 FINDINGS: Cardiomediastinal silhouette is stable. No pulmonary edema. Mild hyperinflation. Stable old right rib fractures. Stable scarring in lingula and right base laterally. No superimposed infiltrate. IMPRESSION: No infiltrate or pulmonary edema. Mild hyperinflation. Stable scarring in right base laterally and lingula. Electronically Signed   By: Lahoma Crocker M.D.   On: 10/14/2015 09:29    EKG:   Orders placed or performed during the hospital encounter of 10/14/15  . ED EKG  . ED EKG  . EKG 12-Lead  . EKG 12-Lead    IMPRESSION AND PLAN:   1. Acute hypoxic respiratory failure secondary to acute COPD exacerbation Provide Solu-Medrol, nebulizer treatments, oxygen via nasal cannula and azithromycin empirically  2. Hyponatremia-probably secondary to poor by  mouth intake Provide gentle hydration with IV fluids and check BMP in a.m.  3. Hypokalemia Replace potassium and check BMP in a.m.  4. Essential hypertension Resume home medications Norvasc, Hyzaar and metoprolol  5. Hyperlipidemia Continue home medication statin  6. Tobacco abuse Consultation to quit smoking for 3-5 minutes. He states that he stopped smoking since last night. We'll provide nicotine patch  Will provide GI and DVT prophylaxis All the records are reviewed and case discussed with ED provider. Management plans discussed with the patient, family and they are in agreement.  CODE STATUS: Full code, wife is the healthcare power of attorney  TOTAL TIME TAKING CARE OF THIS PATIENT: 45 minutes.    Nicholes Mango M.D on 10/14/2015 at 12:24 PM  Between 7am to 6pm - Pager - [phone]  After 6pm go to [email or web address] - password [email or web address] [facility]  Office  [phone]  CC: Primary care physician; Lavonne Chick, MD

## 2015-10-14 NOTE — Care Management Note (Signed)
Case Management Note  Patient Details  Name: Austin Patterson MRN: [id number] Date of Birth: 1956/04/14  Subjective/Objective:   59yo Mr [name] was admitted 10/14/15 per chills and shortness of breath. Dx: COPD exacerbation. Acute 2L N/C. Was last admitted to [facility] in Feb 2016 with sepsis and PNA. Resides at home with wife. No home oxygen, no home assistive equipment and no home health services. PCP=Dr Estell Harpin in [place]. Pharmacy is [facility]. Drives self to his appointments. Has been ambulating to bathroom today without assistance. Case Management will follow for discharge planning.                  Action/Plan:   Expected Discharge Date:                  Expected Discharge Plan:     In-House Referral:     Discharge planning Services     Post Acute Care Choice:    Choice offered to:     DME Arranged:    DME Agency:     HH Arranged:    [place] Agency:     Status of Service:     Medicare Important Message Given:    Date Medicare IM Given:    Medicare IM give by:    Date Additional Medicare IM Given:    Additional Medicare Important Message give by:     If discussed at [place] of Stay Meetings, dates discussed:    Additional Comments:  Joanell Cressler A, RN 10/14/2015, 4:12 PM

## 2015-10-14 NOTE — ED Notes (Addendum)
Pt came to ED c/o SOB that started 3-4 days ago. Pt sts it has been getting worse. Pt presents with dry nonproductive cough.  Pt sts he has been having some chills. Denies fever. Sts he is having some pain in his chest.

## 2015-10-15 LAB — BASIC METABOLIC PANEL
Anion gap: 12 (ref 5–15)
BUN: 17 mg/dL (ref 6–20)
CALCIUM: 8.7 mg/dL — AB (ref 8.9–10.3)
CO2: 27 mmol/L (ref 22–32)
CREATININE: 0.62 mg/dL (ref 0.61–1.24)
Chloride: 97 mmol/L — ABNORMAL LOW (ref 101–111)
GFR calc non Af Amer: >60 mL/min (ref >60)
Glucose, Bld: 178 mg/dL — ABNORMAL HIGH (ref 65–99)
Potassium: 3.4 mmol/L — ABNORMAL LOW (ref 3.5–5.1)
SODIUM: 136 mmol/L (ref 135–145)

## 2015-10-15 LAB — MAGNESIUM: MAGNESIUM: 1.6 mg/dL — AB (ref 1.7–2.4)

## 2015-10-15 LAB — CBC
HCT: 39.4 % — ABNORMAL LOW (ref 40.0–52.0)
Hemoglobin: 13.3 g/dL (ref 13.0–18.0)
MCH: 33.3 pg (ref 26.0–34.0)
MCHC: 33.7 g/dL (ref 32.0–36.0)
MCV: 98.9 fL (ref 80.0–100.0)
PLATELETS: 274 10*3/uL (ref 150–440)
RBC: 3.98 MIL/uL — AB (ref 4.40–5.90)
RDW: 14.6 % — ABNORMAL HIGH (ref 11.5–14.5)
WBC: 12 10*3/uL — AB (ref 3.8–10.6)

## 2015-10-15 LAB — BLOOD GAS, VENOUS
ACID-BASE EXCESS: 5.2 mmol/L — AB (ref 0.0–3.0)
BICARBONATE: 29.9 meq/L — AB (ref 21.0–28.0)
Patient temperature: 37
pCO2, Ven: 43 mmHg — ABNORMAL LOW (ref 44.0–60.0)
pH, Ven: 7.45 — ABNORMAL HIGH (ref 7.320–7.430)

## 2015-10-15 MED ORDER — MAGNESIUM OXIDE 400 (241.3 MG) MG PO TABS
400.0000 mg | ORAL_TABLET | Freq: Two times a day (BID) | ORAL | Status: AC
  Administered 2015-10-15 (×2): 400 mg via ORAL
  Filled 2015-10-15 (×2): qty 1

## 2015-10-15 MED ORDER — NICOTINE 21 MG/24HR TD PT24: 21.0000 mg | MEDICATED_PATCH | Freq: Every day | TRANSDERMAL | Status: DC

## 2015-10-15 MED ORDER — PREDNISONE 10 MG (21) PO TBPK: 10.0000 mg | ORAL_TABLET | Freq: Every day | ORAL | Status: DC

## 2015-10-15 MED ORDER — POTASSIUM CHLORIDE 20 MEQ PO PACK
40.0000 meq | PACK | Freq: Once | ORAL | Status: AC
  Administered 2015-10-15: 40 meq via ORAL
  Filled 2015-10-15: qty 2

## 2015-10-15 MED ORDER — MAGNESIUM 30 MG PO TABS: 30.0000 mg | ORAL_TABLET | Freq: Two times a day (BID) | ORAL | Status: DC

## 2015-10-15 MED ORDER — AZITHROMYCIN 250 MG PO TABS: ORAL_TABLET | ORAL | Status: DC

## 2015-10-15 MED ORDER — METHYLPREDNISOLONE SODIUM SUCC 40 MG IJ SOLR
40.0000 mg | Freq: Two times a day (BID) | INTRAMUSCULAR | Status: DC
  Administered 2015-10-15 – 2015-10-16 (×2): 40 mg via INTRAVENOUS
  Filled 2015-10-15 (×2): qty 1

## 2015-10-15 MED ORDER — POTASSIUM CHLORIDE 20 MEQ PO PACK: 40.0000 meq | PACK | Freq: Once | ORAL | Status: DC

## 2015-10-15 MED ORDER — IPRATROPIUM-ALBUTEROL 0.5-2.5 (3) MG/3ML IN SOLN
3.0000 mL | Freq: Four times a day (QID) | RESPIRATORY_TRACT | Status: DC
  Administered 2015-10-16: 3 mL via RESPIRATORY_TRACT
  Filled 2015-10-15: qty 3

## 2015-10-15 MED ORDER — FLUTICASONE-SALMETEROL 250-50 MCG/DOSE IN AEPB: 1.0000 | INHALATION_SPRAY | Freq: Two times a day (BID) | RESPIRATORY_TRACT | Status: DC

## 2015-10-15 NOTE — Progress Notes (Signed)
[place] at [place] NAME: Austin Patterson    MR#:  [id number]  DATE OF BIRTH:  [birth year]  SUBJECTIVE:  CHIEF COMPLAINT:  Patient is feeling much better today. No shortness of breath. Some cough but improved. Desaturated while ambulating in the hallway  REVIEW OF SYSTEMS:  CONSTITUTIONAL: No fever, fatigue or weakness.  EYES: No blurred or double vision.  EARS, NOSE, AND THROAT: No tinnitus or ear pain.  RESPIRATORY: Some cough, denies shortness of breath, wheezing or hemoptysis.  CARDIOVASCULAR: No chest pain, orthopnea, edema.  GASTROINTESTINAL: No nausea, vomiting, diarrhea or abdominal pain.  GENITOURINARY: No dysuria, hematuria.  ENDOCRINE: No polyuria, nocturia,  HEMATOLOGY: No anemia, easy bruising or bleeding SKIN: No rash or lesion. MUSCULOSKELETAL: No joint pain or arthritis.   NEUROLOGIC: No tingling, numbness, weakness.  PSYCHIATRY: No anxiety or depression.   DRUG ALLERGIES:  No Known Allergies  VITALS:  Blood pressure 118/60, pulse 100, temperature 98.5 F (36.9 C), temperature source Oral, resp. rate 19, height 5\' 7"  (1.702 m), weight 72.576 kg (160 lb), SpO2 98 %.  PHYSICAL EXAMINATION:  GENERAL:  59 y.o.-year-old patient lying in the bed with no acute distress.  EYES: Pupils equal, round, reactive to light and accommodation. No scleral icterus. Extraocular muscles intact.  HEENT: Head atraumatic, normocephalic. Oropharynx and nasopharynx clear.  NECK:  Supple, no jugular venous distention. No thyroid enlargement, no tenderness.  LUNGS: Normal breath sounds bilaterally, minimal end expiratory wheezing, no rales,rhonchi or crepitation. No use of accessory muscles of respiration.  CARDIOVASCULAR: S1, S2 normal. No murmurs, rubs, or gallops.  ABDOMEN: Soft, nontender, nondistended. Bowel sounds present. No organomegaly or mass.  EXTREMITIES: No pedal edema, cyanosis, or clubbing.  NEUROLOGIC: Cranial nerves II  through XII are intact. Muscle strength 5/5 in all extremities. Sensation intact. Gait not checked.  PSYCHIATRIC: The patient is alert and oriented x 3.  SKIN: No obvious rash, lesion, or ulcer.    LABORATORY PANEL:   CBC  Recent Labs Lab 10/15/15 0326  WBC 12.0*  HGB 13.3  HCT 39.4*  PLT 274   ------------------------------------------------------------------------------------------------------------------  Chemistries   Recent Labs Lab 10/15/15 0326  NA 136  K 3.4*  CL 97*  CO2 27  GLUCOSE 178*  BUN 17  CREATININE 0.62  CALCIUM 8.7*  MG 1.6*   ------------------------------------------------------------------------------------------------------------------  Cardiac Enzymes  Recent Labs Lab 10/14/15 0859  TROPONINI <0.03   ------------------------------------------------------------------------------------------------------------------  RADIOLOGY:  Dg Chest Port 1 View  10/14/2015  CLINICAL DATA:  Shortness of breath starting 4 days ago EXAM: PORTABLE CHEST 1 VIEW COMPARISON:  09/03/2015 FINDINGS: Cardiomediastinal silhouette is stable. No pulmonary edema. Mild hyperinflation. Stable old right rib fractures. Stable scarring in lingula and right base laterally. No superimposed infiltrate. IMPRESSION: No infiltrate or pulmonary edema. Mild hyperinflation. Stable scarring in right base laterally and lingula. Electronically Signed   By: Lahoma Crocker M.D.   On: 10/14/2015 09:29    EKG:   Orders placed or performed during the hospital encounter of 10/14/15  . ED EKG  . ED EKG  . EKG 12-Lead  . EKG 12-Lead    ASSESSMENT AND PLAN:   1. Acute hypoxic respiratory failure secondary to acute COPD exacerbation Clinically patient is feeling better but desaturated with ambulation, ambulating pulse ox was 85% on room air Oxygen 2 L via nasal cannula-up titrate to keep his pulse ox 89-90% Incentive spirometry Encourage patient to ambulate in the hallway and wean off  oxygen as tolerated Taper  Solu-Medrol, nebulizer treatments, oxygen via nasal cannula and azithromycin empirically  2. Hyponatremia-probably secondary to poor by mouth intake Resolved with gentle hydration  check BMP in a.m.  3. Hypokalemia and hypomagnesemia Replace potassium and magnesium and check BMP and mag in a.m.  4. Essential hypertension Resume home medications Norvasc, Hyzaar and metoprolol  5. Hyperlipidemia Continue home medication statin  6. Tobacco abuse Consultation to quit smoking for 3-5 minutes. He states that he stopped smoking since last night. We'll provide nicotine patch  Will provide GI and DVT prophylaxis     All the records are reviewed and case discussed with Care Management/Social Workerr. Management plans discussed with the patient, family and they are in agreement.  CODE STATUS: Full code, wife is the healthcare power of attorney  TOTAL TIME TAKING CARE OF THIS PATIENT: 35 minutes.   POSSIBLE D/C IN 1-2 DAYS, DEPENDING ON CLINICAL CONDITION.   Nicholes Mango M.D on 10/15/2015 at 12:31 PM  Between 7am to 6pm - Pager - [phone] After 6pm go to [email or web address] - password [email or web address] [facility]  Office  [phone]  CC: Primary care physician; Lavonne Chick, MD

## 2015-10-15 NOTE — Progress Notes (Signed)
Pt has ambulated twice around nursing station. First time around nursing station O2 was 89 and then second time around was 85. Pt has demonstrated usage of the incentive and reach 2000. Will continue to encourage pt.  Angus Seller

## 2015-10-16 LAB — BASIC METABOLIC PANEL
Anion gap: 8 (ref 5–15)
BUN: 19 mg/dL (ref 6–20)
CALCIUM: 8.8 mg/dL — AB (ref 8.9–10.3)
CO2: 30 mmol/L (ref 22–32)
CREATININE: 0.7 mg/dL (ref 0.61–1.24)
Chloride: 100 mmol/L — ABNORMAL LOW (ref 101–111)
GFR calc Af Amer: >60 mL/min (ref >60)
GLUCOSE: 138 mg/dL — AB (ref 65–99)
Potassium: 4.1 mmol/L (ref 3.5–5.1)
Sodium: 138 mmol/L (ref 135–145)

## 2015-10-16 LAB — MAGNESIUM: Magnesium: 1.6 mg/dL — ABNORMAL LOW (ref 1.7–2.4)

## 2015-10-16 NOTE — Progress Notes (Signed)
Pt ambulated around nursing station with oxygen 2L. O2 stats were 90-92%.  Austin Patterson

## 2015-10-16 NOTE — Discharge Summary (Signed)
[place] at [place] NAME: Austin Patterson    MR#:  [id number]  DATE OF BIRTH:  07-16-1956  DATE OF ADMISSION:  10/14/2015 ADMITTING PHYSICIAN: Nicholes Mango, MD  DATE OF DISCHARGE: 10/16/2015  1:29 PM  PRIMARY CARE PHYSICIAN: Lavonne Chick, MD    ADMISSION DIAGNOSIS:  Hyponatremia [E87.1] Chronic obstructive pulmonary disease with acute exacerbation (HCC) [J44.1]  DISCHARGE DIAGNOSIS:  Active Problems:   COPD with acute exacerbation ([place])   SECONDARY DIAGNOSIS:   Past Medical History  Diagnosis Date  . Hypertension   . Hyperlipidemia     HOSPITAL COURSE:   1. Acute hypoxic respiratory failure secondary to acute COPD exacerbation  Clinically patient is feeling better but still desaturated with ambulation, ambulating pulse ox was 85% on room air  Oxygen 2 L via nasal cannula-up titrate to keep his pulse ox 89-90% , will discharge patient home with 2 L of oxygen via nasal cannula Incentive spirometry  Tapered Solu-Medrol to by mouth prednisone, nebulizer treatments, oxygen via nasal cannula and azithromycin empirically   2. Hyponatremia-probably secondary to poor by mouth intake  Resolved with gentle hydration   3. Hypokalemia and hypomagnesemia  Replaced  potassium and magnesium   4. Essential hypertension  Resume home medications Norvasc, Hyzaar and metoprolol   5. Hyperlipidemia  Continue home medication statin  6. Tobacco abuse   Consultation to quit smoking for 3-5 minutes. He states that he stopped smoking since last night. We'll provide nicotine patch  Will provide GI and DVT prophylaxis   DISCHARGE CONDITIONS:   fair  CONSULTS OBTAINED:      PROCEDURES none  DRUG ALLERGIES:  No Known Allergies  DISCHARGE MEDICATIONS:   Discharge Medication List as of 10/16/2015  1:02 PM    START taking these medications   Details  azithromycin (ZITHROMAX) 250 MG tablet Once daily, Print     Fluticasone-Salmeterol (ADVAIR DISKUS) 250-50 MCG/DOSE AEPB Inhale 1 puff into the lungs 2 (two) times daily., Starting 10/15/2015, Until Discontinued, Print    magnesium 30 MG tablet Take 1 tablet (30 mg total) by mouth 2 (two) times daily., Starting 10/15/2015, Until Discontinued, Print    nicotine (NICODERM CQ - DOSED IN MG/24 HOURS) 21 mg/24hr patch Place 1 patch (21 mg total) onto the skin daily., Starting 10/15/2015, Until Discontinued, Normal    potassium chloride (KLOR-CON) 20 MEQ packet Take 40 mEq by mouth once., Starting 10/15/2015, Normal    predniSONE (STERAPRED UNI-PAK 21 TAB) 10 MG (21) TBPK tablet Take 1 tablet (10 mg total) by mouth daily. Take 6 tablets by mouth for 1 day followed by  5 tablets by mouth for 1 day followed by  4 tablets by mouth for 1 day followed by  3 tablets by mouth for 1 day followed by  2 tablets by mouth for 1 day foll owed by  1 tablet by mouth for a day and stop, Starting 10/15/2015, Until Discontinued, Print      CONTINUE these medications which have NOT CHANGED   Details  albuterol (PROVENTIL HFA;VENTOLIN HFA) 108 (90 BASE) MCG/ACT inhaler Inhale 2 puffs into the lungs every 6 (six) hours as needed for wheezing or shortness of breath., Starting 09/03/2015, Until Discontinued, Print    amLODipine (NORVASC) 10 MG tablet Take 10 mg by mouth daily., Until Discontinued, Historical Med    aspirin EC 81 MG tablet Take 81 mg by mouth daily., Until Discontinued, Historical Med    atorvastatin (LIPITOR) 40 MG tablet  Take 40 mg by mouth at bedtime., Until Discontinued, Historical Med    clopidogrel (PLAVIX) 75 MG tablet Take 75 mg by mouth daily., Until Discontinued, Historical Med    losartan-hydrochlorothiazide (HYZAAR) 100-12.5 MG tablet Take 1 tablet by mouth daily., Until Discontinued, Historical Med    metoprolol succinate (TOPROL-XL) 50 MG 24 hr tablet Take 50 mg by mouth daily., Until Discontinued, Historical Med    pantoprazole (PROTONIX)  40 MG tablet Take 40 mg by mouth daily., Until Discontinued, Historical Med         DISCHARGE INSTRUCTIONS:   Activity as tolerated with 2 L of oxygen via nasal cannula Follow-up with primary care physician in a week Stop smoking  DIET:  Low salt ,low fat  DISCHARGE CONDITION:  Fair  ACTIVITY:  Activity as tolerated  OXYGEN:  Home Oxygen: Yes.     Oxygen Delivery: 2 liters/min via Patient connected to nasal cannula oxygen  DISCHARGE LOCATION:  home   If you experience worsening of your admission symptoms, develop shortness of breath, life threatening emergency, suicidal or homicidal thoughts you must seek medical attention immediately by calling 911 or calling your MD immediately  if symptoms less severe.  You Must read complete instructions/literature along with all the possible adverse reactions/side effects for all the Medicines you take and that have been prescribed to you. Take any new Medicines after you have completely understood and accpet all the possible adverse reactions/side effects.   Please note  You were cared for by a hospitalist during your hospital stay. If you have any questions about your discharge medications or the care you received while you were in the hospital after you are discharged, you can call the unit and asked to speak with the hospitalist on call if the hospitalist that took care of you is not available. Once you are discharged, your primary care physician will handle any further medical issues. Please note that NO REFILLS for any discharge medications will be authorized once you are discharged, as it is imperative that you return to your primary care physician (or establish a relationship with a primary care physician if you do not have one) for your aftercare needs so that they can reassess your need for medications and monitor your lab values.     Today  Chief Complaint  Patient presents with  . Shortness of Breath   Patient is feeling  much better today, still desaturating while ambulating, needing 2 L of oxygen via nasal cannula  ROS:  CONSTITUTIONAL: Denies fevers, chills. Denies any fatigue, weakness.  EYES: Denies blurry vision, double vision, eye pain. EARS, NOSE, THROAT: Denies tinnitus, ear pain, hearing loss. RESPIRATORY: Improved cough, wheeze, shortness of breath.  CARDIOVASCULAR: Denies chest pain, palpitations, edema.  GASTROINTESTINAL: Denies nausea, vomiting, diarrhea, abdominal pain. Denies bright red blood per rectum. GENITOURINARY: Denies dysuria, hematuria. ENDOCRINE: Denies nocturia or thyroid problems. HEMATOLOGIC AND LYMPHATIC: Denies easy bruising or bleeding. SKIN: Denies rash or lesion. MUSCULOSKELETAL: Denies pain in neck, back, shoulder, knees, hips or arthritic symptoms.  NEUROLOGIC: Denies paralysis, paresthesias.  PSYCHIATRIC: Denies anxiety or depressive symptoms.   VITAL SIGNS:  Blood pressure 150/70, pulse 97, temperature 98.4 F (36.9 C), temperature source Oral, resp. rate 18, height 5\' 7"  (1.702 m), weight 72.576 kg (160 lb), SpO2 91 %.  I/O:   Intake/Output Summary (Last 24 hours) at 10/16/15 1559 Last data filed at 10/16/15 0900  Gross per 24 hour  Intake    803 ml  Output   1300 ml  Net   -497 ml    PHYSICAL EXAMINATION:  GENERAL:  59 y.o.-year-old patient lying in the bed with no acute distress.  EYES: Pupils equal, round, reactive to light and accommodation. No scleral icterus. Extraocular muscles intact.  HEENT: Head atraumatic, normocephalic. Oropharynx and nasopharynx clear.  NECK:  Supple, no jugular venous distention. No thyroid enlargement, no tenderness.  LUNGS: Normal breath sounds bilaterally, end expiratory  wheezing, rales,rhonchi or crepitation. No use of accessory muscles of respiration.  CARDIOVASCULAR: S1, S2 normal. No murmurs, rubs, or gallops.  ABDOMEN: Soft, non-tender, non-distended. Bowel sounds present. No organomegaly or mass.  EXTREMITIES: No  pedal edema, cyanosis, or clubbing.  NEUROLOGIC: Cranial nerves II through XII are intact. Muscle strength 5/5 in all extremities. Sensation intact. Gait not checked.  PSYCHIATRIC: The patient is alert and oriented x 3.  SKIN: No obvious rash, lesion, or ulcer.   DATA REVIEW:   CBC  Recent Labs Lab 10/15/15 0326  WBC 12.0*  HGB 13.3  HCT 39.4*  PLT 274    Chemistries   Recent Labs Lab 10/16/15 0532  NA 138  K 4.1  CL 100*  CO2 30  GLUCOSE 138*  BUN 19  CREATININE 0.70  CALCIUM 8.8*  MG 1.6*    Cardiac Enzymes  Recent Labs Lab 10/14/15 0859  TROPONINI <0.03    Microbiology Results  No results found for this or any previous visit.  RADIOLOGY:  Dg Chest Port 1 View  10/14/2015  CLINICAL DATA:  Shortness of breath starting 4 days ago EXAM: PORTABLE CHEST 1 VIEW COMPARISON:  09/03/2015 FINDINGS: Cardiomediastinal silhouette is stable. No pulmonary edema. Mild hyperinflation. Stable old right rib fractures. Stable scarring in lingula and right base laterally. No superimposed infiltrate. IMPRESSION: No infiltrate or pulmonary edema. Mild hyperinflation. Stable scarring in right base laterally and lingula. Electronically Signed   By: Lahoma Crocker M.D.   On: 10/14/2015 09:29    EKG:   Orders placed or performed during the hospital encounter of 10/14/15  . ED EKG  . ED EKG  . EKG 12-Lead  . EKG 12-Lead      Management plans discussed with the patient, family and they are in agreement.  CODE STATUS:     Code Status Orders        Start     Ordered   10/14/15 1342  Full code   Continuous     10/14/15 1342      TOTAL TIME TAKING CARE OF THIS PATIENT: 45  minutes.    @[facility] @  on 10/16/2015 at 3:59 PM  Between 7am to 6pm - Pager - [phone]  After 6pm go to [email or web address] - password [email or web address] [facility]  Office  [phone]  CC: Primary care physician; Lavonne Chick, MD

## 2015-10-16 NOTE — Progress Notes (Signed)
Patient discharge teaching given, including activity, diet, follow-up appoints, and medications. Patient verbalized understanding of all discharge instructions. IV access was d/c'd. Vitals are stable. Skin is intact except as charted in most recent assessments. Pt to be escorted out by wife, to be driven home by family.  Austin Patterson

## 2015-10-16 NOTE — Progress Notes (Signed)
SATURATION QUALIFICATIONS: (This note is used to comply with regulatory documentation for home oxygen)  Patient Saturations on Room Air at Rest = 88%  Patient Saturations on Room Air while Ambulating =85 %  Patient Saturations on 2Liters of oxygen while Ambulating =92 %  Please briefly explain why patient needs home oxygen:COPD 

## 2015-10-16 NOTE — Discharge Instructions (Signed)
Diet - low salt diet Activity - as tolerated  F/u with pcp in a week

## 2015-10-16 NOTE — Care Management (Signed)
Spoke with Patient who is from home and independent. Drives self and denies issues with obtaining medications. Home O2 setup with [employer] who will deliver prior to discharge. No other CM needs identified.

## 2015-10-16 NOTE — Progress Notes (Signed)
[place], [place].   10/16/2015  Patient: Austin Patterson   Date of Birth:  [birth year]  Date of admission:  10/14/2015  Date of Discharge  10/16/2015    To Whom it May Concern:   Feng Devich  may return to work on 10/21/2015.  PHYSICAL ACTIVITY:  Full  If you have any questions or concerns, please don't hesitate to call.  Sincerely,   Nicholes Mango M.D Pager [phone] Office : [phone]   .

## 2015-10-17 NOTE — Progress Notes (Signed)
Called pharmacy today and clarified magnesium dose. Patient was not discharged home with any potassium supplements

## 2015-11-20 DIAGNOSIS — J8410 Pulmonary fibrosis, unspecified: Secondary | ICD-10-CM

## 2015-11-20 HISTORY — DX: Pulmonary fibrosis, unspecified: J84.10

## 2015-12-09 ENCOUNTER — Encounter: Payer: Self-pay | Admitting: Emergency Medicine

## 2015-12-09 ENCOUNTER — Emergency Department: Payer: Managed Care, Other (non HMO)

## 2015-12-09 ENCOUNTER — Inpatient Hospital Stay
Admission: EM | Admit: 2015-12-09 | Discharge: 2015-12-16 | DRG: 190 | Disposition: A | Discharge status: Home / Self Care | Payer: Managed Care, Other (non HMO) | Attending: Internal Medicine | Admitting: Internal Medicine

## 2015-12-09 DIAGNOSIS — J8410 Pulmonary fibrosis, unspecified: Secondary | ICD-10-CM | POA: Diagnosis not present

## 2015-12-09 DIAGNOSIS — E785 Hyperlipidemia, unspecified: Secondary | ICD-10-CM | POA: Diagnosis present

## 2015-12-09 DIAGNOSIS — Z8701 Personal history of pneumonia (recurrent): Secondary | ICD-10-CM

## 2015-12-09 DIAGNOSIS — J440 Chronic obstructive pulmonary disease with acute lower respiratory infection: Secondary | ICD-10-CM | POA: Diagnosis present

## 2015-12-09 DIAGNOSIS — R0600 Dyspnea, unspecified: Secondary | ICD-10-CM

## 2015-12-09 DIAGNOSIS — J45901 Unspecified asthma with (acute) exacerbation: Secondary | ICD-10-CM

## 2015-12-09 DIAGNOSIS — E876 Hypokalemia: Secondary | ICD-10-CM | POA: Diagnosis present

## 2015-12-09 DIAGNOSIS — J849 Interstitial pulmonary disease, unspecified: Secondary | ICD-10-CM | POA: Diagnosis present

## 2015-12-09 DIAGNOSIS — J9811 Atelectasis: Secondary | ICD-10-CM | POA: Diagnosis present

## 2015-12-09 DIAGNOSIS — J9621 Acute and chronic respiratory failure with hypoxia: Secondary | ICD-10-CM | POA: Diagnosis present

## 2015-12-09 DIAGNOSIS — J441 Chronic obstructive pulmonary disease with (acute) exacerbation: Secondary | ICD-10-CM | POA: Diagnosis present

## 2015-12-09 DIAGNOSIS — Z7982 Long term (current) use of aspirin: Secondary | ICD-10-CM

## 2015-12-09 DIAGNOSIS — F17200 Nicotine dependence, unspecified, uncomplicated: Secondary | ICD-10-CM | POA: Diagnosis present

## 2015-12-09 DIAGNOSIS — I2510 Atherosclerotic heart disease of native coronary artery without angina pectoris: Secondary | ICD-10-CM | POA: Diagnosis present

## 2015-12-09 DIAGNOSIS — J810 Acute pulmonary edema: Secondary | ICD-10-CM | POA: Diagnosis not present

## 2015-12-09 DIAGNOSIS — Z955 Presence of coronary angioplasty implant and graft: Secondary | ICD-10-CM | POA: Diagnosis not present

## 2015-12-09 DIAGNOSIS — J189 Pneumonia, unspecified organism: Secondary | ICD-10-CM | POA: Diagnosis present

## 2015-12-09 DIAGNOSIS — I10 Essential (primary) hypertension: Secondary | ICD-10-CM | POA: Diagnosis present

## 2015-12-09 HISTORY — DX: Chronic obstructive pulmonary disease, unspecified: J44.9

## 2015-12-09 LAB — BASIC METABOLIC PANEL
ANION GAP: 11 (ref 5–15)
BUN: 16 mg/dL (ref 6–20)
CHLORIDE: 100 mmol/L — AB (ref 101–111)
CO2: 25 mmol/L (ref 22–32)
CREATININE: 0.79 mg/dL (ref 0.61–1.24)
Calcium: 8.1 mg/dL — ABNORMAL LOW (ref 8.9–10.3)
GFR calc non Af Amer: >60 mL/min (ref >60)
Glucose, Bld: 174 mg/dL — ABNORMAL HIGH (ref 65–99)
POTASSIUM: 3.4 mmol/L — AB (ref 3.5–5.1)
SODIUM: 136 mmol/L (ref 135–145)

## 2015-12-09 LAB — RAPID INFLUENZA A&B ANTIGENS (ARMC ONLY)
INFLUENZA A (ARMC): NOT DETECTED
INFLUENZA B (ARMC): NOT DETECTED

## 2015-12-09 LAB — CBC WITH DIFFERENTIAL/PLATELET
BASOS PCT: 0 %
Basophils Absolute: 0 10*3/uL (ref 0–0.1)
EOS ABS: 0 10*3/uL (ref 0–0.7)
Eosinophils Relative: 0 %
HCT: 33.2 % — ABNORMAL LOW (ref 40.0–52.0)
HEMOGLOBIN: 11.4 g/dL — AB (ref 13.0–18.0)
Lymphocytes Relative: 5 %
Lymphs Abs: 0.6 10*3/uL — ABNORMAL LOW (ref 1.0–3.6)
MCH: 32 pg (ref 26.0–34.0)
MCHC: 34.4 g/dL (ref 32.0–36.0)
MCV: 93.1 fL (ref 80.0–100.0)
MONO ABS: 0.6 10*3/uL (ref 0.2–1.0)
MONOS PCT: 5 %
NEUTROS PCT: 90 %
Neutro Abs: 10.3 10*3/uL — ABNORMAL HIGH (ref 1.4–6.5)
Platelets: 588 10*3/uL — ABNORMAL HIGH (ref 150–440)
RBC: 3.57 MIL/uL — ABNORMAL LOW (ref 4.40–5.90)
RDW: 15.4 % — ABNORMAL HIGH (ref 11.5–14.5)
WBC: 11.6 10*3/uL — ABNORMAL HIGH (ref 3.8–10.6)

## 2015-12-09 LAB — URINALYSIS COMPLETE WITH MICROSCOPIC (ARMC ONLY)
BACTERIA UA: NONE SEEN
Bilirubin Urine: NEGATIVE
Glucose, UA: NEGATIVE mg/dL
Hgb urine dipstick: NEGATIVE
Ketones, ur: NEGATIVE mg/dL
LEUKOCYTES UA: NEGATIVE
Nitrite: NEGATIVE
PH: 5 (ref 5.0–8.0)
PROTEIN: NEGATIVE mg/dL
RBC / HPF: NONE SEEN RBC/hpf (ref 0–5)
SPECIFIC GRAVITY, URINE: 1.017 (ref 1.005–1.030)
SQUAMOUS EPITHELIAL / LPF: NONE SEEN

## 2015-12-09 LAB — TROPONIN I: TROPONIN I: 0.05 ng/mL — AB (ref <0.031)

## 2015-12-09 LAB — LACTIC ACID, PLASMA
LACTIC ACID, VENOUS: 2.1 mmol/L — AB (ref 0.5–2.0)
LACTIC ACID, VENOUS: 2.9 mmol/L — AB (ref 0.5–2.0)
Lactic Acid, Venous: 4.6 mmol/L (ref 0.5–2.0)

## 2015-12-09 MED ORDER — ALBUTEROL SULFATE (2.5 MG/3ML) 0.083% IN NEBU
7.5000 mg | INHALATION_SOLUTION | Freq: Once | RESPIRATORY_TRACT | Status: AC
Start: 2015-12-09 — End: 2015-12-09
  Administered 2015-12-09: 7.5 mg via RESPIRATORY_TRACT
  Filled 2015-12-09: qty 9

## 2015-12-09 MED ORDER — LOSARTAN POTASSIUM 50 MG PO TABS
100.0000 mg | ORAL_TABLET | Freq: Every day | ORAL | Status: DC
  Administered 2015-12-10 – 2015-12-16 (×7): 100 mg via ORAL
  Filled 2015-12-09 (×7): qty 2

## 2015-12-09 MED ORDER — IPRATROPIUM-ALBUTEROL 0.5-2.5 (3) MG/3ML IN SOLN
3.0000 mL | RESPIRATORY_TRACT | Status: DC
  Administered 2015-12-09 – 2015-12-11 (×12): 3 mL via RESPIRATORY_TRACT
  Filled 2015-12-09 (×13): qty 3

## 2015-12-09 MED ORDER — POTASSIUM CHLORIDE 20 MEQ PO PACK
40.0000 meq | PACK | Freq: Once | ORAL | Status: AC
  Administered 2015-12-09: 40 meq via ORAL
  Filled 2015-12-09: qty 2

## 2015-12-09 MED ORDER — ONDANSETRON HCL 4 MG/2ML IJ SOLN: 4.0000 mg | Freq: Four times a day (QID) | INTRAMUSCULAR | Status: DC | PRN

## 2015-12-09 MED ORDER — AMLODIPINE BESYLATE 10 MG PO TABS
10.0000 mg | ORAL_TABLET | Freq: Every day | ORAL | Status: DC
  Administered 2015-12-10 – 2015-12-16 (×7): 10 mg via ORAL
  Filled 2015-12-09 (×7): qty 1

## 2015-12-09 MED ORDER — MOMETASONE FURO-FORMOTEROL FUM 100-5 MCG/ACT IN AERO
2.0000 | INHALATION_SPRAY | Freq: Two times a day (BID) | RESPIRATORY_TRACT | Status: DC
  Administered 2015-12-09 – 2015-12-16 (×14): 2 via RESPIRATORY_TRACT
  Filled 2015-12-09: qty 8.8

## 2015-12-09 MED ORDER — ENOXAPARIN SODIUM 40 MG/0.4ML ~~LOC~~ SOLN
40.0000 mg | SUBCUTANEOUS | Status: DC
Start: 2015-12-09 — End: 2015-12-15
  Administered 2015-12-09 – 2015-12-14 (×6): 40 mg via SUBCUTANEOUS
  Filled 2015-12-09 (×6): qty 0.4

## 2015-12-09 MED ORDER — MAGNESIUM 30 MG PO TABS: 30.0000 mg | ORAL_TABLET | Freq: Two times a day (BID) | ORAL | Status: DC

## 2015-12-09 MED ORDER — HYDROCHLOROTHIAZIDE 12.5 MG PO CAPS
12.5000 mg | ORAL_CAPSULE | Freq: Every day | ORAL | Status: DC
  Administered 2015-12-10 – 2015-12-16 (×7): 12.5 mg via ORAL
  Filled 2015-12-09 (×7): qty 1

## 2015-12-09 MED ORDER — HYDROCODONE-ACETAMINOPHEN 5-325 MG PO TABS: 1.0000 | ORAL_TABLET | ORAL | Status: DC | PRN

## 2015-12-09 MED ORDER — POTASSIUM CHLORIDE 20 MEQ/15ML (10%) PO SOLN
40.0000 meq | Freq: Once | ORAL | Status: AC
  Administered 2015-12-09: 40 meq via ORAL
  Filled 2015-12-09 (×2): qty 30

## 2015-12-09 MED ORDER — SODIUM CHLORIDE 0.9 % IV BOLUS (SEPSIS): 500.0000 mL | INTRAVENOUS | Status: AC

## 2015-12-09 MED ORDER — ASPIRIN EC 81 MG PO TBEC
81.0000 mg | DELAYED_RELEASE_TABLET | Freq: Every day | ORAL | Status: DC
  Administered 2015-12-10 – 2015-12-16 (×7): 81 mg via ORAL
  Filled 2015-12-09 (×7): qty 1

## 2015-12-09 MED ORDER — ATORVASTATIN CALCIUM 20 MG PO TABS
40.0000 mg | ORAL_TABLET | Freq: Every day | ORAL | Status: DC
  Administered 2015-12-09 – 2015-12-15 (×7): 40 mg via ORAL
  Filled 2015-12-09 (×7): qty 2

## 2015-12-09 MED ORDER — METHYLPREDNISOLONE SODIUM SUCC 125 MG IJ SOLR
60.0000 mg | Freq: Three times a day (TID) | INTRAMUSCULAR | Status: DC
  Administered 2015-12-09 – 2015-12-10 (×2): 60 mg via INTRAVENOUS
  Filled 2015-12-09 (×3): qty 2

## 2015-12-09 MED ORDER — DEXTROSE 5 % IV SOLN
500.0000 mg | Freq: Once | INTRAVENOUS | Status: AC
  Administered 2015-12-09: 500 mg via INTRAVENOUS
  Filled 2015-12-09: qty 500

## 2015-12-09 MED ORDER — SODIUM CHLORIDE 0.9% FLUSH
3.0000 mL | Freq: Two times a day (BID) | INTRAVENOUS | Status: DC
  Administered 2015-12-09 – 2015-12-16 (×14): 3 mL via INTRAVENOUS

## 2015-12-09 MED ORDER — ACETAMINOPHEN 650 MG RE SUPP: 650.0000 mg | Freq: Four times a day (QID) | RECTAL | Status: DC | PRN

## 2015-12-09 MED ORDER — NICOTINE 21 MG/24HR TD PT24
21.0000 mg | MEDICATED_PATCH | Freq: Every day | TRANSDERMAL | Status: DC
  Filled 2015-12-09: qty 1

## 2015-12-09 MED ORDER — METOPROLOL SUCCINATE ER 50 MG PO TB24
50.0000 mg | ORAL_TABLET | Freq: Every day | ORAL | Status: DC
  Administered 2015-12-10 – 2015-12-16 (×7): 50 mg via ORAL
  Filled 2015-12-09 (×7): qty 1

## 2015-12-09 MED ORDER — ACETAMINOPHEN 325 MG PO TABS
650.0000 mg | ORAL_TABLET | Freq: Four times a day (QID) | ORAL | Status: DC | PRN
  Administered 2015-12-09 – 2015-12-16 (×15): 650 mg via ORAL
  Filled 2015-12-09 (×16): qty 2

## 2015-12-09 MED ORDER — LEVOFLOXACIN IN D5W 750 MG/150ML IV SOLN
750.0000 mg | INTRAVENOUS | Status: DC
  Filled 2015-12-09: qty 150

## 2015-12-09 MED ORDER — LOSARTAN POTASSIUM-HCTZ 100-12.5 MG PO TABS: 1.0000 | ORAL_TABLET | Freq: Every day | ORAL | Status: DC

## 2015-12-09 MED ORDER — CLOPIDOGREL BISULFATE 75 MG PO TABS
75.0000 mg | ORAL_TABLET | Freq: Every day | ORAL | Status: DC
  Administered 2015-12-10 – 2015-12-16 (×7): 75 mg via ORAL
  Filled 2015-12-09 (×7): qty 1

## 2015-12-09 MED ORDER — DEXTROSE 5 % IV SOLN
1.0000 g | Freq: Once | INTRAVENOUS | Status: AC
  Administered 2015-12-09: 1 g via INTRAVENOUS
  Filled 2015-12-09: qty 10

## 2015-12-09 MED ORDER — MAGNESIUM OXIDE 400 (241.3 MG) MG PO TABS
200.0000 mg | ORAL_TABLET | Freq: Two times a day (BID) | ORAL | Status: DC
  Administered 2015-12-09 – 2015-12-16 (×14): 200 mg via ORAL
  Filled 2015-12-09 (×3): qty 1
  Filled 2015-12-09: qty 2
  Filled 2015-12-09: qty 1
  Filled 2015-12-09 (×2): qty 2
  Filled 2015-12-09 (×7): qty 1

## 2015-12-09 MED ORDER — ALUM & MAG HYDROXIDE-SIMETH 200-200-20 MG/5ML PO SUSP: 30.0000 mL | Freq: Four times a day (QID) | ORAL | Status: DC | PRN

## 2015-12-09 MED ORDER — ONDANSETRON HCL 4 MG PO TABS: 4.0000 mg | ORAL_TABLET | Freq: Four times a day (QID) | ORAL | Status: DC | PRN

## 2015-12-09 MED ORDER — SODIUM CHLORIDE 0.9 % IV BOLUS (SEPSIS)
1000.0000 mL | INTRAVENOUS | Status: AC
  Administered 2015-12-09: 2500 mL via INTRAVENOUS

## 2015-12-09 MED ORDER — IPRATROPIUM-ALBUTEROL 0.5-2.5 (3) MG/3ML IN SOLN
3.0000 mL | Freq: Once | RESPIRATORY_TRACT | Status: AC
  Administered 2015-12-09: 3 mL via RESPIRATORY_TRACT
  Filled 2015-12-09: qty 3

## 2015-12-09 MED ORDER — PANTOPRAZOLE SODIUM 40 MG PO TBEC
40.0000 mg | DELAYED_RELEASE_TABLET | Freq: Every day | ORAL | Status: DC
  Administered 2015-12-10 – 2015-12-16 (×7): 40 mg via ORAL
  Filled 2015-12-09 (×7): qty 1

## 2015-12-09 MED ORDER — ALBUTEROL SULFATE (2.5 MG/3ML) 0.083% IN NEBU: 3.0000 mL | INHALATION_SOLUTION | Freq: Four times a day (QID) | RESPIRATORY_TRACT | Status: DC | PRN

## 2015-12-09 NOTE — Progress Notes (Signed)
CRITICAL VALUE ALERT  Critical value received:  Lactic acid 2.9;trending down  Date of notification: 2/20  Time of notification:  2155  Critical value read back:yes  Nurse who received alert: Sabra Heck, RN  MD notified (1st page):  Dr Benjie Karvonen notified earlier of results  Time of first page:1800   MD notified (2nd page):  Time of second page:  Responding MD:  Dr Benjie Karvonen aware  Time MD responded:  1800

## 2015-12-09 NOTE — Progress Notes (Signed)
Pt. admitted to unit, [id number] from ED, report from [place], [place]. Oriented to room, call bell, Ascom phones and staff. Bed in low position. Fall safety plan reviewed, blue non-skid socks in place. Pt educated on safety and told to call when he needs to get OOB. Patient verbalizes agreement with this. Full assessment to Epic; skin assessed with Thomas Hoff, RN. Telemetry box verified with tele clerk and Angelia Mould, NT: [phone] . Will continue to monitor.

## 2015-12-09 NOTE — ED Provider Notes (Signed)
[facility] Emergency Department Provider Note  ____________________________________________  Time seen: Seen upon arrival to the emergency department  I have reviewed the triage vital signs and the nursing notes.   HISTORY  Chief Complaint Shortness of Breath    HPI Austin Patterson is a 60 y.o. male with a history of COPD and hypertension as well as CAD who is presenting today with 1 week of worsening cough and fever. He says that over the past week he has had worsening shortness of breath. He is on baseline 2 L nasal cannula oxygen was found to be 88% today when the medics arrived. He was involved with 4 L. He was given 2 DuoNeb en route. He says that he has had a mild cough. Denies any known sick contacts. Says that had pneumonia in February last year and felt similarly. He took Tylenol about 4:30 AM this morning is right-sided headache which is now gone away. He says that he has stopped smoking as of 60 days ago. Says that he is dull and mild mid chest pain which is nonradiating and constant over the past week.Given 125 mg of Solu-Medrol en route.   Past Medical History  Diagnosis Date  . Hypertension   . Hyperlipidemia   . COPD (chronic obstructive pulmonary disease) [facility])     Patient Active Problem List   Diagnosis Date Noted  . COPD with acute exacerbation ([place]) 10/14/2015    Past Surgical History  Procedure Laterality Date  . Coronary stent placement    . Shoulder acromioplasty      Current Outpatient Rx  Name  Route  Sig  Dispense  Refill  . albuterol (PROVENTIL HFA;VENTOLIN HFA) 108 (90 BASE) MCG/ACT inhaler   Inhalation   Inhale 2 puffs into the lungs every 6 (six) hours as needed for wheezing or shortness of breath.   1 Inhaler   2   . amLODipine (NORVASC) 10 MG tablet   Oral   Take 10 mg by mouth daily.         [name] aspirin EC 81 MG tablet   Oral   Take 81 mg by mouth daily.         [name] atorvastatin (LIPITOR) 40 MG tablet    Oral   Take 40 mg by mouth at bedtime.         [name] azithromycin (ZITHROMAX) 250 MG tablet      Once daily   5 each   0   . clopidogrel (PLAVIX) 75 MG tablet   Oral   Take 75 mg by mouth daily.         . Fluticasone-Salmeterol (ADVAIR DISKUS) 250-50 MCG/DOSE AEPB   Inhalation   Inhale 1 puff into the lungs 2 (two) times daily.   60 each   0   . losartan-hydrochlorothiazide (HYZAAR) 100-12.5 MG tablet   Oral   Take 1 tablet by mouth daily.         . magnesium 30 MG tablet   Oral   Take 1 tablet (30 mg total) by mouth 2 (two) times daily.   15 tablet   0   . metoprolol succinate (TOPROL-XL) 50 MG 24 hr tablet   Oral   Take 50 mg by mouth daily.         . nicotine (NICODERM CQ - DOSED IN MG/24 HOURS) 21 mg/24hr patch   Transdermal   Place 1 patch (21 mg total) onto the skin daily.   28 patch   0   .  pantoprazole (PROTONIX) 40 MG tablet   Oral   Take 40 mg by mouth daily.         . potassium chloride (KLOR-CON) 20 MEQ packet   Oral   Take 40 mEq by mouth once.   1 packet   0   . predniSONE (STERAPRED UNI-PAK 21 TAB) 10 MG (21) TBPK tablet   Oral   Take 1 tablet (10 mg total) by mouth daily. Take 6 tablets by mouth for 1 day followed by  5 tablets by mouth for 1 day followed by  4 tablets by mouth for 1 day followed by  3 tablets by mouth for 1 day followed by  2 tablets by mouth for 1 day followed by  1 tablet by mouth for a day and stop   21 tablet   0     Allergies Review of patient's allergies indicates no known allergies.  Family History  Problem Relation Age of Onset  . Heart disease Mother     Social History Social History  Substance Use Topics  . Smoking status: Current Every Day Smoker  . Smokeless tobacco: Not on file  . Alcohol Use: Yes     Comment: occasional     Review of Systems Constitutional:  fever/chills Eyes: No visual changes. ENT: No sore throat. Cardiovascular: As above Respiratory: As above Gastrointestinal:  No abdominal pain.  No nausea, no vomiting.  No diarrhea.  No constipation. Genitourinary: Negative for dysuria. Musculoskeletal: Negative for back pain. Skin: Negative for rash. Neurological: Negative for headaches, focal weakness or numbness.  10-point ROS otherwise negative.  ____________________________________________   PHYSICAL EXAM:  VITAL SIGNS: ED Triage Vitals  Enc Vitals Group     BP 12/09/15 1301 155/84 mmHg     Pulse Rate 12/09/15 1301 124     Resp 12/09/15 1301 25     Temp 12/09/15 1301 100 F (37.8 C)     Temp Source 12/09/15 1301 Oral     SpO2 12/09/15 1256 93 %     Weight 12/09/15 1301 175 lb (79.379 kg)     Height 12/09/15 1301 5\' 7"  (1.702 m)     Head Cir --      Peak Flow --      Pain Score --      Pain Loc --      Pain Edu? --      Excl. in [place]? --     Constitutional: Alert and oriented. Well appearing and in no acute distress. Eyes: Conjunctivae are normal. PERRL. EOMI. Head: Atraumatic. Nose: No congestion/rhinnorhea. Mouth/Throat: Mucous membranes are moist.  Oropharynx non-erythematous. Neck: No stridor.   Cardiovascular: Tachycardic, regular rhythm. Grossly normal heart sounds.  Good peripheral circulation. Respiratory: Tachypneic with wheezing throughout and decreased air movement. Speaks in full sentences. Gastrointestinal: Soft and nontender. No distention. No abdominal bruits. No CVA tenderness. Musculoskeletal: No lower extremity tenderness nor edema.  No joint effusions. Neurologic:  Normal speech and language. No gross focal neurologic deficits are appreciated. No gait instability. Skin:  Skin is warm, dry and intact. No rash noted. Psychiatric: Mood and affect are normal. Speech and behavior are normal.  ____________________________________________   LABS (all labs ordered are listed, but only abnormal results are displayed)  Labs Reviewed  CBC WITH DIFFERENTIAL/PLATELET - Abnormal; Notable for the following:    WBC 11.6 (*)     RBC 3.57 (*)    Hemoglobin 11.4 (*)    HCT 33.2 (*)    RDW 15.4 (*)  Platelets 588 (*)    Neutro Abs 10.3 (*)    Lymphs Abs 0.6 (*)    All other components within normal limits  BASIC METABOLIC PANEL - Abnormal; Notable for the following:    Potassium 3.4 (*)    Chloride 100 (*)    Glucose, Bld 174 (*)    Calcium 8.1 (*)    All other components within normal limits  TROPONIN I - Abnormal; Notable for the following:    Troponin I 0.05 (*)    All other components within normal limits  CULTURE, BLOOD (ROUTINE X 2)  CULTURE, BLOOD (ROUTINE X 2)  URINE CULTURE  LACTIC ACID, PLASMA  LACTIC ACID, PLASMA  URINALYSIS COMPLETEWITH MICROSCOPIC ([facility] ONLY)   ____________________________________________  EKG  ED ECG REPORT I, Doran Stabler, the attending physician, personally viewed and interpreted this ECG.   Date: 12/09/2015  EKG Time: 1304  Rate: 124  Rhythm: sinus tachycardia  Axis: Normal axis  Intervals:none  ST&T Change: ST depression which is minimal in 1, 2, 3, aVF as well as V5 and V6. Likely demand related. No ST segment elevation. Single T-wave inversion in aVL.  ____________________________________________  RADIOLOGY  IMPRESSION: Mild hyperinflation. There is mild reticular interstitial prominence bilateral upper lobe, perihilar and infrahilar region. Mild reticular interstitial prominence in lingula. Findings highly suspicious for asymmetric pulmonary edema or nonspecific pneumonitis. Clinical correlation is necessary. ____________________________________________   PROCEDURES  ____________________________________________   INITIAL IMPRESSION / ASSESSMENT AND PLAN / ED COURSE  Pertinent labs & imaging results that were available during my care of the patient were reviewed by me and considered in my medical decision making (see chart for details).  ----------------------------------------- 2:14 PM on  12/09/2015 -----------------------------------------  Patient 95% on the nebulizer.  Subjectively he says he does not feel any improvement yet. He has minimal increased air movement on auscultation throughout his fields. I updated him as to his x-ray results and the need for additional hospital. Repeat of the x-ray read as pulmonary edema versus pneumonitis. Given his history of pneumonia, elevated white count as well as tachycardia and borderline fever I believe that this is an infectious process. He will be treated for pneumonia and admitted to the hospital. He understands the plan and is willing to comply. Signed out to Dr.Modi.   ____________________________________________   FINAL CLINICAL IMPRESSION(S) / ED DIAGNOSES  Acute COPD exacerbation. Pneumonia.    Orbie Pyo, MD 12/09/15 [phone]

## 2015-12-09 NOTE — Progress Notes (Signed)
Pharmacy Antibiotic Note  Austin Patterson is a 60 y.o. male admitted on 12/09/2015 with pneumonia.  Pharmacy has been consulted for levofloxacin dosing.  Plan: Patient was given one dose of azithromycin 500 mg and ceftriaxone 1 g IV in the ED today at 1420 for pneumonia. Pharmacy was then consulted to dose levofloxacin on admission for pneumonia.   CrCl = 99 mL/min  Will start levofloxacin 750 mg IV daily per renal function and indication. However, patient received azithromycin + CTX today in the ED. Will begin levofloxacin tomorrow as it must be separated from azithromycin by 24 hours due to risk of QTc prolongation.  Height: 5\' 7"  (170.2 cm) Weight: 175 lb (79.379 kg) IBW/kg (Calculated) : 66.1  Temp (24hrs), Avg:100 F (37.8 C), Min:100 F (37.8 C), Max:100 F (37.8 C)   Recent Labs Lab 12/09/15 1316 12/09/15 1403  WBC 11.6*  --   CREATININE 0.79  --   LATICACIDVEN  --  2.1*    Estimated Creatinine Clearance: 99.2 mL/min (by C-G formula based on Cr of 0.79).    No Known Allergies  Antimicrobials this admission: CTX & azithromycin 2/20 - one time doses in the ED levofloxacin 2/21 >>   Dose adjustments this admission: n/a  Microbiology results: 2/20 BCx: Sent 2/20 UCx: Sent   Thank you for allowing pharmacy to be a part of this patient's care.  Lenis Noon, PharmD Clinical Pharmacist 12/09/2015 3:36 PM

## 2015-12-09 NOTE — H&P (Signed)
[facility] [place] NAME: Austin Patterson    MR#:  [id number]  DATE OF BIRTH:  1956-08-17  DATE OF ADMISSION:  12/09/2015  PRIMARY CARE PHYSICIAN: Lavonne Chick, MD   REQUESTING/REFERRING PHYSICIAN:  Dr Ann Maki  CHIEF COMPLAINT:    Shortness of breath  HISTORY OF PRESENT ILLNESS:  Austin Patterson  is a 61 y.o. male with a known history of COPD and essential hypertension presents with above complaint. Patient saw his primary care physician at the beginning of February was treated for upper respiratory infection with [facility]. Patient since then has subsequently continued to have cough, wheezing and shortness of breath. Oxygen saturation was 88% on his baseline of 2 L. X-ray in the emergency room showed pneumonia. Patient started on antibiotics for community-acquired pneumonia.  PAST MEDICAL HISTORY:   Past Medical History  Diagnosis Date  . Hypertension   . Hyperlipidemia   . COPD (chronic obstructive pulmonary disease) ([place])     PAST SURGICAL HISTORY:   Past Surgical History  Procedure Laterality Date  . Coronary stent placement    . Shoulder acromioplasty      SOCIAL HISTORY:   Social History  Substance Use Topics  . Smoking status: Current Every Day Smoker  . Smokeless tobacco: No  . Alcohol Use: Yes     Comment: occasional     FAMILY HISTORY:   Family History  Problem Relation Age of Onset  . Heart disease Mother     DRUG ALLERGIES:  No Known Allergies   REVIEW OF SYSTEMS:  CONSTITUTIONAL: No fever, fatigue + generalized weakness.  EYES: No blurred or double vision.  EARS, NOSE, AND THROAT: No tinnitus or ear pain.  RESPIRATORY: [name] for cough, shortness of breath, wheezing no hemoptysis.  CARDIOVASCULAR: No chest pain, orthopnea, edema.  GASTROINTESTINAL: No nausea, vomiting, diarrhea or abdominal pain.  GENITOURINARY: No dysuria, hematuria.  ENDOCRINE: No polyuria, nocturia,  HEMATOLOGY:  No anemia, easy bruising or bleeding SKIN: No rash or lesion. MUSCULOSKELETAL: No joint pain or arthritis.   NEUROLOGIC: No tingling, numbness, weakness.  PSYCHIATRY: No anxiety or depression.   MEDICATIONS AT HOME:   Prior to Admission medications   Medication Sig Start Date End Date Taking? Authorizing Provider  albuterol (PROVENTIL HFA;VENTOLIN HFA) 108 (90 BASE) MCG/ACT inhaler Inhale 2 puffs into the lungs every 6 (six) hours as needed for wheezing or shortness of breath. 09/03/15  Yes Harvest Dark, MD  amLODipine (NORVASC) 10 MG tablet Take 10 mg by mouth daily.   Yes Historical Provider, MD  aspirin EC 81 MG tablet Take 81 mg by mouth daily.   Yes Historical Provider, MD  atorvastatin (LIPITOR) 40 MG tablet Take 40 mg by mouth at bedtime.   Yes Historical Provider, MD  clopidogrel (PLAVIX) 75 MG tablet Take 75 mg by mouth daily.   Yes Historical Provider, MD  Fluticasone-Salmeterol (ADVAIR DISKUS) 250-50 MCG/DOSE AEPB Inhale 1 puff into the lungs 2 (two) times daily. 10/15/15  Yes Nicholes Mango, MD  loratadine (CLARITIN) 10 MG tablet Take 10 mg by mouth daily.   Yes Historical Provider, MD  losartan-hydrochlorothiazide (HYZAAR) 100-12.5 MG tablet Take 1 tablet by mouth daily.   Yes Historical Provider, MD  metoprolol succinate (TOPROL-XL) 50 MG 24 hr tablet Take 50 mg by mouth daily.   Yes Historical Provider, MD  nicotine (NICODERM CQ - DOSED IN MG/24 HOURS) 21 mg/24hr patch Place 1 patch (21 mg total) onto the skin daily. 10/15/15  Yes  Nicholes Mango, MD  pantoprazole (PROTONIX) 40 MG tablet Take 40 mg by mouth daily.   Yes Historical Provider, MD      VITAL SIGNS:  Blood pressure 135/88, pulse 132, temperature 100 F (37.8 C), temperature source Oral, resp. rate 26, height 5\' 7"  (1.702 m), weight 79.379 kg (175 lb), SpO2 90 %.  PHYSICAL EXAMINATION:  GENERAL:  60 y.o.-year-old patient lying in the bed with no acute distress.  EYES: Pupils equal, round, reactive to light and  accommodation. No scleral icterus. Extraocular muscles intact.  HEENT: Head atraumatic, normocephalic. Oropharynx and nasopharynx clear.  NECK:  Supple, no jugular venous distention. No thyroid enlargement, no tenderness.  LUNGS: Decreased breath sounds throughout. Patient sounds very tight No use of accessory muscles of respiration.  CARDIOVASCULAR: S1, S2 normal. No murmurs, rubs, or gallops.  ABDOMEN: Soft, nontender, nondistended. Bowel sounds present. No organomegaly or mass.  EXTREMITIES: No pedal edema, cyanosis, or clubbing.  NEUROLOGIC: Cranial nerves II through XII are grossly intact. No focal deficits. PSYCHIATRIC: The patient is alert and oriented x 3.  SKIN: No obvious rash, lesion, or ulcer.   LABORATORY PANEL:   CBC  Recent Labs Lab 12/09/15 1316  WBC 11.6*  HGB 11.4*  HCT 33.2*  PLT 588*   ------------------------------------------------------------------------------------------------------------------  Chemistries   Recent Labs Lab 12/09/15 1316  NA 136  K 3.4*  CL 100*  CO2 25  GLUCOSE 174*  BUN 16  CREATININE 0.79  CALCIUM 8.1*   ------------------------------------------------------------------------------------------------------------------  Cardiac Enzymes  Recent Labs Lab 12/09/15 1316  TROPONINI 0.05*   ------------------------------------------------------------------------------------------------------------------  RADIOLOGY:  Dg Chest 1 View  12/09/2015  CLINICAL DATA:  Shortness of breath for 1 week, COPD EXAM: CHEST 1 VIEW COMPARISON:  10/14/2015 and 01/28/2015 FINDINGS: Cardiomediastinal silhouette is stable. Mild hyperinflation. There is mild reticular interstitial prominence bilateral upper lobe, perihilar and infrahilar region. Mild reticular interstitial prominence in lingula. Findings highly suspicious for asymmetric pulmonary edema or nonspecific pneumonitis. Clinical correlation is necessary. IMPRESSION: Mild hyperinflation.  There is mild reticular interstitial prominence bilateral upper lobe, perihilar and infrahilar region. Mild reticular interstitial prominence in lingula. Findings highly suspicious for asymmetric pulmonary edema or nonspecific pneumonitis. Clinical correlation is necessary. Electronically Signed   By: Lahoma Crocker M.D.   On: 12/09/2015 14:02    EKG:   Normal sinus rhythm no ST elevation or depression  IMPRESSION AND PLAN:    60 year old male with a history of COPD  who was recently treated with antibiotics for URI presents with increasing shortness of breath and found to have pneumonia on chest x-ray  1. Acute on chronic hypoxic respiratory failure: Patient has increasing O2 requirements. He wears 2 L oxygen at bedtime. This is due to pneumonia with mild COPD exacerbation.  2. Community-acquired pneumonia: Continue Levaquin and follow up on blood cultures. Wean oxygen as tolerated to 2 L which is his baseline.  Check influenza and  urine Legionella  3. COPD exacerbation, acute: Continue IV steroids, nebulizers and oxygen. Titrate to home 2 L of oxygen.  4. Hypokalemia: Replace potassium.  5. Essential hypertension: Continue Hyzaar and metoprolol.  6.Hyperlipidemia: Continue atorvastatin.  7. Tobacco dependence:Patient is trying to quit smoking. He will continue nicotine patch. Patient counseled for 3 minutes.    All the records are reviewed and case discussed with ED provider. Management plans discussed with the patient and he is in agreement.  CODE STATUS: FULL  TOTAL TIME TAKING CARE OF THIS PATIENT: 50 minutes.    Trany Chernick M.D on  12/09/2015 at 3:22 PM  Between 7am to 6pm - Pager - [phone] After 6pm go to [email or web address] - password [email or web address] [facility]  Office  [phone]  CC: Primary care physician; Lavonne Chick, MD

## 2015-12-09 NOTE — ED Notes (Signed)
Per EMS, patient comes from home with c/o shortness of breath x1 week. Patient called his PCP today and got a rx for antibiotics and a steroid he took both around 11am today. EMS gave 125 of solu-medrol and 2 duo neb tx. Hx of COPD. Former smoker. Quit "60 days ago". Patient A&O x4. ST on monitor.

## 2015-12-09 NOTE — Progress Notes (Signed)
Lactic acid 4.6, Dr. Benjie Karvonen called and updated. Will recheck lactic acid; no further orders, will continue to monitor.

## 2015-12-10 DIAGNOSIS — J189 Pneumonia, unspecified organism: Secondary | ICD-10-CM

## 2015-12-10 DIAGNOSIS — J441 Chronic obstructive pulmonary disease with (acute) exacerbation: Secondary | ICD-10-CM

## 2015-12-10 LAB — HEMOGLOBIN A1C: Hgb A1c MFr Bld: 4.9 % (ref 4.0–6.0)

## 2015-12-10 MED ORDER — DEXTROSE 5 % IV SOLN
500.0000 mg | INTRAVENOUS | Status: DC
  Administered 2015-12-10 – 2015-12-11 (×2): 500 mg via INTRAVENOUS
  Filled 2015-12-10 (×3): qty 500

## 2015-12-10 MED ORDER — DEXTROSE 5 % IV SOLN
1.0000 g | INTRAVENOUS | Status: DC
  Administered 2015-12-10 – 2015-12-11 (×2): 1 g via INTRAVENOUS
  Filled 2015-12-10 (×3): qty 10

## 2015-12-10 MED ORDER — METHYLPREDNISOLONE SODIUM SUCC 125 MG IJ SOLR
60.0000 mg | INTRAMUSCULAR | Status: DC
  Administered 2015-12-10: 60 mg via INTRAVENOUS

## 2015-12-10 MED ORDER — METHYLPREDNISOLONE SODIUM SUCC 125 MG IJ SOLR: 60.0000 mg | INTRAMUSCULAR | Status: DC

## 2015-12-10 MED ORDER — NICOTINE 7 MG/24HR TD PT24
7.0000 mg | MEDICATED_PATCH | Freq: Every day | TRANSDERMAL | Status: DC
  Administered 2015-12-10 – 2015-12-16 (×7): 7 mg via TRANSDERMAL
  Filled 2015-12-10 (×8): qty 1

## 2015-12-10 MED ORDER — LEVOFLOXACIN IN D5W 750 MG/150ML IV SOLN
750.0000 mg | INTRAVENOUS | Status: DC
  Filled 2015-12-10: qty 150

## 2015-12-10 NOTE — Care Management Note (Signed)
Case Management Note  Patient Details  Name: AUTRY PRUST MRN: [id number] Date of Birth: 1956/06/07  Subjective/Objective:  RNCM consult for discharge planning. Met with patient and his wife at bedside. Patient lives at home with wife and is independent with adls. He requires no DME. He continues to drive. Uses home O2 PRN. Current with PCP. Denies issues obtaining medications, copays or medical care. On HFNC at this time. Will continue to follow.                   Action/Plan:   Expected Discharge Date:                  Expected Discharge Plan:  Home/Self Care  In-House Referral:     Discharge planning Services     Post Acute Care Choice:    Choice offered to:     DME Arranged:    DME Agency:     HH Arranged:    HH Agency:     Status of Service:  In process, will continue to follow  Medicare Important Message Given:    Date Medicare IM Given:    Medicare IM give by:    Date Additional Medicare IM Given:    Additional Medicare Important Message give by:     If discussed at [place] of Stay Meetings, dates discussed:    Additional Comments:  Jolly Mango, RN 12/10/2015, 2:26 PM

## 2015-12-10 NOTE — Progress Notes (Signed)
Patient oxygen saturations 85% on venti mask. Dr. Vianne Bulls notified and patient switched to high flow nasal cannula. Patient tolerating well now at 96%. Will continue to assess. Austin Patterson

## 2015-12-10 NOTE — Progress Notes (Signed)
Verbal order received to place patient back on cardiac monitoring at this time. Austin Patterson

## 2015-12-10 NOTE — Progress Notes (Signed)
Verbal order from Dr. Posey Pronto received for pulmonary consult. Patient o2 on high flow had to be increased to maintain sats. Patient's saturations currently in 90's on 70%. Dr. Posey Pronto paged to ask for patient to stay on tele to monitor closely. Per protocol patient cannot transfer off tele floor because of respiratory status. Wilnette Kales

## 2015-12-10 NOTE — Progress Notes (Signed)
[facility] at [place] NAME: Austin Patterson    MR#:  [id number]  DATE OF BIRTH:  January 22, 1956  SUBJECTIVE:  Dictated with a greater than shortness of breath and was found to have patchy bilateral infiltrates started on antibiotics for possible pneumonia Patient did respiratory distress now on high flow nasal cannula oxygen 70% heavy smoker quit about 2 months ago   REVIEW OF SYSTEMS:   Review of Systems  Constitutional: Positive for malaise/fatigue. Negative for fever, chills and weight loss.  HENT: Negative for ear discharge, ear pain and nosebleeds.   Eyes: Negative for blurred vision, pain and discharge.  Respiratory: Positive for shortness of breath. Negative for sputum production, wheezing and stridor.   Cardiovascular: Negative for chest pain, palpitations, orthopnea and PND.  Gastrointestinal: Negative for nausea, vomiting, abdominal pain and diarrhea.  Genitourinary: Negative for urgency and frequency.  Musculoskeletal: Negative for back pain and joint pain.  Neurological: Positive for weakness. Negative for sensory change, speech change and focal weakness.  Psychiatric/Behavioral: Negative for depression and hallucinations. The patient is not nervous/anxious.   All other systems reviewed and are negative.  Tolerating Diet:yES  Tolerating PT: Pending  DRUG ALLERGIES:  No Known Allergies  VITALS:  Blood pressure 133/71, pulse 102, temperature 97.8 F (36.6 C), temperature source Oral, resp. rate 20, height 5\' 7"  (1.702 m), weight 76.068 kg (167 lb 11.2 oz), SpO2 93 %.  PHYSICAL EXAMINATION:   Physical Exam  GENERAL:  60 y.o.-year-old patient lying in the bed withmild  acute distress.  EYES: Pupils equal, round, reactive to light and accommodation. No scleral icterus. Extraocular muscles intact.  HEENT: Head atraumatic, normocephalic. Oropharynx and nasopharynx clear.  NECK:  Supple, no jugular venous distention. No  thyroid enlargement, no tenderness.  LUNGS: Distant  breath sounds bilaterally,scattered  wheezing,  no rales, rhonchi. No use of accessory muscles of respiration.  CARDIOVASCULAR: S1, S2 normal. No murmurs, rubs, or gallops.  ABDOMEN: Soft, nontender, nondistended. Bowel sounds present. No organomegaly or mass.  EXTREMITIES: No cyanosis, clubbing or edema b/l.    NEUROLOGIC: Cranial nerves II through XII are intact. No focal Motor or sensory deficits b/l.   PSYCHIATRIC:  patient is alert and oriented x 3.  SKIN: No obvious rash, lesion, or ulcer.   LABORATORY PANEL:  CBC  Recent Labs Lab 12/09/15 1316  WBC 11.6*  HGB 11.4*  HCT 33.2*  PLT 588*    Chemistries   Recent Labs Lab 12/09/15 1316  NA 136  K 3.4*  CL 100*  CO2 25  GLUCOSE 174*  BUN 16  CREATININE 0.79  CALCIUM 8.1*   Cardiac Enzymes  Recent Labs Lab 12/09/15 1316  TROPONINI 0.05*   RADIOLOGY:  Dg Chest 1 View  12/09/2015  CLINICAL DATA:  Shortness of breath for 1 week, COPD EXAM: CHEST 1 VIEW COMPARISON:  10/14/2015 and 01/28/2015 FINDINGS: Cardiomediastinal silhouette is stable. Mild hyperinflation. There is mild reticular interstitial prominence bilateral upper lobe, perihilar and infrahilar region. Mild reticular interstitial prominence in lingula. Findings highly suspicious for asymmetric pulmonary edema or nonspecific pneumonitis. Clinical correlation is necessary. IMPRESSION: Mild hyperinflation. There is mild reticular interstitial prominence bilateral upper lobe, perihilar and infrahilar region. Mild reticular interstitial prominence in lingula. Findings highly suspicious for asymmetric pulmonary edema or nonspecific pneumonitis. Clinical correlation is necessary. Electronically Signed   By: Lahoma Crocker M.D.   On: 12/09/2015 14:02   ASSESSMENT AND PLAN:  60 year old male with a history of COPD who  was recently treated with antibiotics for URI presents with increasing shortness of breath and found to  have pneumonia on chest x-ray  1. Acute on chronic hypoxic respiratory failure: Patient has increasing O2 requirements. He wears 2 L oxygen at bedtime. This is due to pneumonia with mild COPD exacerbation. -Patient currently  on high flow nasal cannula 70% FiO2 -Continue IV Levaquin, Solu-Medrol, nebulizer treatment, ordered inhalers -Pulmonic consultation.  2. Community-acquired pneumonia: Continue Levaquin and follow up on blood cultures. Wean oxygen as tolerated to 2 L which is his baseline.   negative for influenza and urine Legionella  3. COPD exacerbation, acute: Continue IV steroids, nebulizers and oxygen. Titrate to home 2 L of oxygen.  4. Hypokalemia: Replace potassium.  5. Essential hypertension: Continue Hyzaar and metoprolol.  6.Hyperlipidemia: Continue atorvastatin.  7. Tobacco dependence:Patient is trying to quit smoking. He will continue nicotine patch. Patient counseled for 3 minutes.  Case discussed with Care Management/Social Worker. Management plans discussed with the patient, family and they are in agreement.  CODE STATUS: Full  DVT Prophylaxis: Lovenox  TOTAL TIME TAKING CARE OF THIS PATI30 minutes.  >50% time spent on counselling and coordination of care  POSSIBLE D/C IN  2-3 DAYS, DEPENDING ON CLINICAL CONDITION.  Note: This dictation was prepared with Dragon dictation along with smaller phrase technology. Any transcriptional errors that result from this process are unintentional.  Mandeep Kiser M.D on 12/10/2015 at 1:43 PM  Between 7am to 6pm - Pager - [phone]  After 6pm go to [email or web address] - password [email or web address] [facility]  Office  [phone]  CC: Primary care physician; Lavonne Chick, MD

## 2015-12-10 NOTE — Consult Note (Signed)
PULMONARY / CRITICAL CARE MEDICINE   Name: Austin Patterson MRN: [id number] DOB: [birth year]    ADMISSION DATE:  12/09/2015 CONSULTATION DATE:  12/10/15  REFERRING MD :  Dr. Benjie Karvonen   CHIEF COMPLAINT:    shortness of breath   HISTORY OF PRESENT ILLNESS    60 y.o. male with a known history of COPD and essential hypertension presents with above complaint. Patient saw his primary care physician at the beginning of February was treated for upper respiratory infection with [facility]. Patient since then has subsequently continued to have cough, wheezing and shortness of breath. Oxygen saturation was 88% on his baseline of 2 L. X-ray in the emergency room showed pneumonia. Patient started on antibiotics for community-acquired pneumonia.  She states he quit smoking about 2 months ago, currently he is on high flow a cannula 70% FiO2, this occurring overnight. He states that he usually does deer hunting in the winter, last time hunting was December 2015. He had a similar episode of "pneumonia" in January 2016, and he states that this episode is similar to the one he had in January 2016.   SIGNIFICANT EVENTS      PAST MEDICAL HISTORY    :  Past Medical History  Diagnosis Date  . Hypertension   . Hyperlipidemia   . COPD (chronic obstructive pulmonary disease) [facility])    Past Surgical History  Procedure Laterality Date  . Coronary stent placement    . Shoulder acromioplasty     Prior to Admission medications   Medication Sig Start Date End Date Taking? Authorizing Provider  albuterol (PROVENTIL HFA;VENTOLIN HFA) 108 (90 BASE) MCG/ACT inhaler Inhale 2 puffs into the lungs every 6 (six) hours as needed for wheezing or shortness of breath. 09/03/15  Yes Harvest Dark, MD  amLODipine (NORVASC) 10 MG tablet Take 10 mg by mouth daily.   Yes Historical Provider, MD  aspirin EC 81 MG tablet Take 81 mg by mouth daily.   Yes Historical Provider, MD  atorvastatin (LIPITOR) 40 MG tablet Take 40  mg by mouth at bedtime.   Yes Historical Provider, MD  clopidogrel (PLAVIX) 75 MG tablet Take 75 mg by mouth daily.   Yes Historical Provider, MD  Fluticasone-Salmeterol (ADVAIR DISKUS) 250-50 MCG/DOSE AEPB Inhale 1 puff into the lungs 2 (two) times daily. 10/15/15  Yes Nicholes Mango, MD  loratadine (CLARITIN) 10 MG tablet Take 10 mg by mouth daily.   Yes Historical Provider, MD  losartan-hydrochlorothiazide (HYZAAR) 100-12.5 MG tablet Take 1 tablet by mouth daily.   Yes Historical Provider, MD  metoprolol succinate (TOPROL-XL) 50 MG 24 hr tablet Take 50 mg by mouth daily.   Yes Historical Provider, MD  nicotine (NICODERM CQ - DOSED IN MG/24 HOURS) 21 mg/24hr patch Place 1 patch (21 mg total) onto the skin daily. 10/15/15  Yes Nicholes Mango, MD  pantoprazole (PROTONIX) 40 MG tablet Take 40 mg by mouth daily.   Yes Historical Provider, MD   No Known Allergies   FAMILY HISTORY   Family History  Problem Relation Age of Onset  . Heart disease Mother       SOCIAL HISTORY    reports that he quit smoking about 8 weeks ago. He does not have any smokeless tobacco history on file. He reports that he drinks about 16.8 oz of alcohol per week. He reports that he does not use illicit drugs.  Review of Systems  Constitutional: Positive for chills. Negative for fever, weight loss, malaise/fatigue and diaphoresis.  HENT: Negative for  hearing loss.   Eyes: Negative for blurred vision and double vision.  Respiratory: Positive for cough and shortness of breath. Negative for sputum production.   Cardiovascular: Negative for chest pain and palpitations.  Gastrointestinal: Negative for heartburn, nausea, vomiting and abdominal pain.  Genitourinary: Negative for dysuria.  Musculoskeletal: Negative for myalgias.  Skin: Negative for itching and rash.  Neurological: Negative for dizziness and headaches.  Endo/Heme/Allergies: Does not bruise/bleed easily.  Psychiatric/Behavioral: Negative for depression.       VITAL SIGNS    Temp:  [97.8 F (36.6 C)-98.5 F (36.9 C)] 97.8 F (36.6 C) (02/21 1118) Pulse Rate:  [91-124] 102 (02/21 1118) Resp:  [18-32] 20 (02/21 1118) BP: (125-143)/(65-74) 133/71 mmHg (02/21 1118) SpO2:  [81 %-98 %] 93 % (02/21 1149) FiO2 (%):  [70 %] 70 % (02/21 1149) Weight:  [167 lb 11.2 oz (76.068 kg)] 167 lb 11.2 oz (76.068 kg) (02/20 1646) HEMODYNAMICS:   VENTILATOR SETTINGS: Vent Mode:  [-]  FiO2 (%):  [70 %] 70 % INTAKE / OUTPUT:  Intake/Output Summary (Last 24 hours) at 12/10/15 1422 Last data filed at 12/10/15 1405  Gross per 24 hour  Intake    600 ml  Output   1550 ml  Net   -950 ml       PHYSICAL EXAM   Physical Exam  Constitutional: He is oriented to person, place, and time. He appears well-developed and well-nourished.  HENT:  Head: Normocephalic and atraumatic.  Right Ear: External ear normal.  Left Ear: External ear normal.  Eyes: Conjunctivae and EOM are normal. Pupils are equal, round, and reactive to light.  Neck: Neck supple. No tracheal deviation present. No thyromegaly present.  Cardiovascular: Normal rate, regular rhythm and intact distal pulses.   Pulmonary/Chest: No respiratory distress. He has no wheezes. He has rales.  Shallow breath sounds at the bilateral bases, fine bilateral basilar crackles  Abdominal: Soft. Bowel sounds are normal. He exhibits no distension.  Musculoskeletal: Normal range of motion. He exhibits no edema or tenderness.  Neurological: He is alert and oriented to person, place, and time.  Skin: Skin is warm and dry.  Psychiatric: He has a normal mood and affect.  Nursing note and vitals reviewed.      LABS   LABS:  CBC  Recent Labs Lab 12/09/15 1316  WBC 11.6*  HGB 11.4*  HCT 33.2*  PLT 588*   Coag's No results for input(s): APTT, INR in the last 168 hours. BMET  Recent Labs Lab 12/09/15 1316  NA 136  K 3.4*  CL 100*  CO2 25  BUN 16  CREATININE 0.79  GLUCOSE 174*    Electrolytes  Recent Labs Lab 12/09/15 1316  CALCIUM 8.1*   Sepsis Markers  Recent Labs Lab 12/09/15 1403 12/09/15 1646 12/09/15 2051  LATICACIDVEN 2.1* 4.6* 2.9*   ABG No results for input(s): PHART, PCO2ART, PO2ART in the last 168 hours. Liver Enzymes No results for input(s): AST, ALT, ALKPHOS, BILITOT, ALBUMIN in the last 168 hours. Cardiac Enzymes  Recent Labs Lab 12/09/15 1316  TROPONINI 0.05*   Glucose No results for input(s): GLUCAP in the last 168 hours.   Recent Results (from the past 240 hour(s))  Blood Culture (routine x 2)     Status: None (Preliminary result)   Collection Time: 12/09/15  2:03 PM  Result Value Ref Range Status   Specimen Description BLOOD LEFT WRIST  Final   Special Requests   Final    BOTTLES DRAWN AEROBIC AND ANAEROBIC  AER 4ML ANA 3ML   Culture NO GROWTH < 24 HOURS  Final   Report Status PENDING  Incomplete  Blood Culture (routine x 2)     Status: None (Preliminary result)   Collection Time: 12/09/15  2:03 PM  Result Value Ref Range Status   Specimen Description BLOOD RIGHT AC  Final   Special Requests   Final    BOTTLES DRAWN AEROBIC AND ANAEROBIC AER 2ML ANA 0.5ML   Culture NO GROWTH < 24 HOURS  Final   Report Status PENDING  Incomplete  Urine culture     Status: None (Preliminary result)   Collection Time: 12/09/15  2:39 PM  Result Value Ref Range Status   Specimen Description URINE, RANDOM  Final   Special Requests NONE  Final   Culture NO GROWTH < 24 HOURS  Final   Report Status PENDING  Incomplete  Rapid Influenza A&B Antigens ([facility] only)     Status: None   Collection Time: 12/09/15  3:34 PM  Result Value Ref Range Status   Influenza A ([facility]) NOT DETECTED  Final   Influenza B ([facility]) NOT DETECTED  Final     Current facility-administered medications:  .  acetaminophen (TYLENOL) tablet 650 mg, 650 mg, Oral, Q6H PRN, 650 mg at 12/10/15 1123 **OR** acetaminophen (TYLENOL) suppository 650 mg, 650 mg, Rectal, Q6H PRN,  Sital Mody, MD .  albuterol (PROVENTIL) (2.5 MG/3ML) 0.083% nebulizer solution 3 mL, 3 mL, Inhalation, Q6H PRN, Bettey Costa, MD .  alum & mag hydroxide-simeth (MAALOX/MYLANTA) 200-200-20 MG/5ML suspension 30 mL, 30 mL, Oral, Q6H PRN, Sital Mody, MD .  amLODipine (NORVASC) tablet 10 mg, 10 mg, Oral, Daily, Bettey Costa, MD, 10 mg at 12/10/15 0951 .  aspirin EC tablet 81 mg, 81 mg, Oral, Daily, Bettey Costa, MD, 81 mg at 12/10/15 0950 .  atorvastatin (LIPITOR) tablet 40 mg, 40 mg, Oral, QHS, Sital Mody, MD, 40 mg at 12/09/15 2256 .  clopidogrel (PLAVIX) tablet 75 mg, 75 mg, Oral, Daily, Bettey Costa, MD, 75 mg at 12/10/15 0950 .  enoxaparin (LOVENOX) injection 40 mg, 40 mg, Subcutaneous, Q24H, Sital Mody, MD, 40 mg at 12/09/15 2257 .  losartan (COZAAR) tablet 100 mg, 100 mg, Oral, Daily, 100 mg at 12/10/15 0950 **AND** hydrochlorothiazide (MICROZIDE) capsule 12.5 mg, 12.5 mg, Oral, Daily, Sital Mody, MD, 12.5 mg at 12/10/15 0950 .  HYDROcodone-acetaminophen (NORCO/VICODIN) 5-325 MG per tablet 1-2 tablet, 1-2 tablet, Oral, Q4H PRN, Sital Mody, MD .  ipratropium-albuterol (DUONEB) 0.5-2.5 (3) MG/3ML nebulizer solution 3 mL, 3 mL, Nebulization, Q4H, Sital Mody, MD, 3 mL at 12/10/15 1147 .  levofloxacin (LEVAQUIN) IVPB 750 mg, 750 mg, Intravenous, Q24H, Charlett Nose, RPH .  magnesium oxide (MAG-OX) tablet 200 mg, 200 mg, Oral, BID, Bettey Costa, MD, 200 mg at 12/10/15 0950 .  methylPREDNISolone sodium succinate (SOLU-MEDROL) 125 mg/2 mL injection 60 mg, 60 mg, Intravenous, 3 times per day, Bettey Costa, MD, 60 mg at 12/10/15 0524 .  metoprolol succinate (TOPROL-XL) 24 hr tablet 50 mg, 50 mg, Oral, Daily, Bettey Costa, MD, 50 mg at 12/10/15 0950 .  mometasone-formoterol (DULERA) 100-5 MCG/ACT inhaler 2 puff, 2 puff, Inhalation, BID, Bettey Costa, MD, 2 puff at 12/10/15 0951 .  nicotine (NICODERM CQ - dosed in mg/24 hr) patch 7 mg, 7 mg, Transdermal, Daily, Epifanio Lesches, MD, 7 mg at 12/10/15 1122 .   ondansetron (ZOFRAN) tablet 4 mg, 4 mg, Oral, Q6H PRN **OR** ondansetron (ZOFRAN) injection 4 mg, 4 mg, Intravenous, Q6H PRN, Sital  Mody, MD .  pantoprazole (PROTONIX) EC tablet 40 mg, 40 mg, Oral, Daily, Bettey Costa, MD, 40 mg at 12/10/15 0950 .  sodium chloride flush (NS) 0.9 % injection 3 mL, 3 mL, Intravenous, Q12H, Sital Mody, MD, 3 mL at 12/10/15 1000  IMAGING    No results found.    Indwelling Urinary Catheter continued, requirement due to   Reason to continue Indwelling Urinary Catheter for strict Intake/Output monitoring for hemodynamic instability   Central Line continued, requirement due to   Reason to continue [employer] Monitoring of central venous pressure or other hemodynamic parameters   Ventilator continued, requirement due to, resp failure    Ventilator Sedation RASS 0 to -2   Cultures: BCx2 2/20>> UC 2/20>> Sputum Influenza A and B negative  Antibiotics: Levaquin 2/20>>  Lines:   ASSESSMENT/PLAN   60 year old male past medical history of COPD, hypertension, seen in consultation for acute exacerbation of COPD  AECOPD - Maintain O2 saturations greater than 88% -Continue Levaquin, total antibiotic duration and be up to 10 days -Continue steroids, may consider transitioning to oral steroids, prolonged taper given his level of COPD, taper over 14 days -Follow-up on sputum culture -Incentive spirometry -I reviewed his chest x-ray, there is no overt infiltrate to suggest an acute pneumonia, however there is significant flattening of the diaphragms and reticulation noted especially of the basilar prominences, I believe he has more of a pneumonitis and COPD exacerbation, would benefit more from steroids than antibiotics at this point. - wean high flow nasal cannula as tolerated to maintain O2 saturations greater than 88% -May consider noninvasive positive pressure ventilation i.e. BiPAP/CPAP at night during inpatient stay only  Pneumonitis -Continue with  steroids as stated above. Consider transitioning to 40 mg prednisone orally, tapering over 2 weeks - Maintain O2 saturations greater than 80% -Again as stated above consider noninvasive positive pressure ventilation  I have personally obtained a history, examined the patient, evaluated laboratory and imaging results, formulated the assessment and plan and placed orders.  Pulmonary Care Time devoted to patient care services described in this note is 45 minutes.    Vilinda Boehringer, MD [facility] Pager [phone] (please enter 7-digits) On Call Pager [phone] (please enter 7-digits)     12/10/2015, 2:22 PM  Note: This note was prepared with Dragon dictation along with smaller phrase technology. Any transcriptional errors that result from this process are unintentional.

## 2015-12-10 NOTE — Progress Notes (Signed)
Patient had gone to bathroom on room air and O2 sat was 74%. Patient's sat was only 83% on 6 lpm so patient was placed on a 55% Venturi Mask after breathing treatment.

## 2015-12-11 ENCOUNTER — Inpatient Hospital Stay: Payer: Managed Care, Other (non HMO)

## 2015-12-11 ENCOUNTER — Encounter: Payer: Self-pay | Admitting: Radiology

## 2015-12-11 LAB — GLUCOSE, CAPILLARY: GLUCOSE-CAPILLARY: 153 mg/dL — AB (ref 65–99)

## 2015-12-11 LAB — URINE CULTURE: Culture: NO GROWTH

## 2015-12-11 MED ORDER — IPRATROPIUM-ALBUTEROL 0.5-2.5 (3) MG/3ML IN SOLN
3.0000 mL | Freq: Four times a day (QID) | RESPIRATORY_TRACT | Status: DC
Start: 2015-12-12 — End: 2015-12-16
  Administered 2015-12-12 – 2015-12-16 (×18): 3 mL via RESPIRATORY_TRACT
  Filled 2015-12-11 (×18): qty 3

## 2015-12-11 MED ORDER — METHYLPREDNISOLONE SODIUM SUCC 125 MG IJ SOLR
60.0000 mg | Freq: Three times a day (TID) | INTRAMUSCULAR | Status: DC
  Administered 2015-12-11 – 2015-12-14 (×10): 60 mg via INTRAVENOUS
  Filled 2015-12-11 (×10): qty 2

## 2015-12-11 MED ORDER — IOHEXOL 350 MG/ML SOLN
75.0000 mL | Freq: Once | INTRAVENOUS | Status: AC | PRN
  Administered 2015-12-11: 75 mL via INTRAVENOUS

## 2015-12-11 NOTE — Progress Notes (Signed)
[place] at [place] NAME: Austin Patterson    MR#:  [id number]  DATE OF BIRTH:  20-Mar-1956  SUBJECTIVE:  Patient did respiratory distress now on high flow nasal cannula oxygen 70% heavy smoker quit about 2 months ago  REVIEW OF SYSTEMS:   Review of Systems  Constitutional: Positive for malaise/fatigue. Negative for fever, chills and weight loss.  HENT: Negative for ear discharge, ear pain and nosebleeds.   Eyes: Negative for blurred vision, pain and discharge.  Respiratory: Positive for shortness of breath. Negative for sputum production, wheezing and stridor.   Cardiovascular: Negative for chest pain, palpitations, orthopnea and PND.  Gastrointestinal: Negative for nausea, vomiting, abdominal pain and diarrhea.  Genitourinary: Negative for urgency and frequency.  Musculoskeletal: Negative for back pain and joint pain.  Neurological: Positive for weakness. Negative for sensory change, speech change and focal weakness.  Psychiatric/Behavioral: Negative for depression and hallucinations. The patient is not nervous/anxious.   All other systems reviewed and are negative.  Tolerating Diet:yES  Tolerating PT: Pending  DRUG ALLERGIES:  No Known Allergies  VITALS:  Blood pressure 135/73, pulse 81, temperature 98 F (36.7 C), temperature source Oral, resp. rate 20, height 5\' 7"  (1.702 m), weight 76.068 kg (167 lb 11.2 oz), SpO2 96 %.  PHYSICAL EXAMINATION:   Physical Exam  GENERAL:  60 y.o.-year-old patient lying in the bed withmild  acute distress.  EYES: Pupils equal, round, reactive to light and accommodation. No scleral icterus. Extraocular muscles intact.  HEENT: Head atraumatic, normocephalic. Oropharynx and nasopharynx clear.  NECK:  Supple, no jugular venous distention. No thyroid enlargement, no tenderness.  LUNGS: Distant  breath sounds bilaterally,scattered  wheezing,  no rales, rhonchi. No use of accessory muscles of  respiration.  CARDIOVASCULAR: S1, S2 normal. No murmurs, rubs, or gallops.  ABDOMEN: Soft, nontender, nondistended. Bowel sounds present. No organomegaly or mass.  EXTREMITIES: No cyanosis, clubbing or edema b/l.    NEUROLOGIC: Cranial nerves II through XII are intact. No focal Motor or sensory deficits b/l.   PSYCHIATRIC:  patient is alert and oriented x 3.  SKIN: No obvious rash, lesion, or ulcer.   LABORATORY PANEL:  CBC  Recent Labs Lab 12/09/15 1316  WBC 11.6*  HGB 11.4*  HCT 33.2*  PLT 588*    Chemistries   Recent Labs Lab 12/09/15 1316  NA 136  K 3.4*  CL 100*  CO2 25  GLUCOSE 174*  BUN 16  CREATININE 0.79  CALCIUM 8.1*   Cardiac Enzymes  Recent Labs Lab 12/09/15 1316  TROPONINI 0.05*   RADIOLOGY:  No results found. ASSESSMENT AND PLAN:  60 year old male with a history of COPD who was recently treated with antibiotics for URI presents with increasing shortness of breath and found to have pneumonia on chest x-ray  1. Acute on chronic hypoxic respiratory failure: Patient has increasing O2 requirements. He wears 2 L oxygen at bedtime. This is due to acute bronchitis with  COPD exacerbation. -Patient currently  on high flow nasal cannula 70% FiO2 -Continue IV Levaquin, Solu-Medrol, nebulizer treatment, ordered inhalers -Pulmonary consultation appreciated. No pneumonia according to their evaluation -Very slow improvement. 2. Acute bronchitis - Continue Levaquin and follow up on blood cultures. Wean oxygen as tolerated to 2 L which is his baseline.   negative for influenza and urine Legionella  3. COPD exacerbation, acute: Continue IV steroids, nebulizers and oxygen. Titrate to home 2 L of oxygen.  4. Hypokalemia: Replace potassium.  5. Essential  hypertension: Continue Hyzaar and metoprolol.  6.Hyperlipidemia: Continue atorvastatin.  7. Tobacco dependence:Patient is trying to quit smoking. He will continue nicotine patch. Patient counseled for 3  minutes.  Case discussed with Care Management/Social Worker. Management plans discussed with the patient, family and they are in agreement.  CODE STATUS: Full  DVT Prophylaxis: Lovenox  TOTAL TIME TAKING CARE OF THIS PATI30 minutes.  >50% time spent on counselling and coordination of care  POSSIBLE D/C IN  2-3 DAYS, DEPENDING ON CLINICAL CONDITION.  Note: This dictation was prepared with Dragon dictation along with smaller phrase technology. Any transcriptional errors that result from this process are unintentional.  Endya Austin M.D on 12/11/2015 at 2:30 PM  Between 7am to 6pm - Pager - [phone]  After 6pm go to [email or web address] - password [email or web address] [facility]  Office  [phone]  CC: Primary care physician; Lavonne Chick, MD

## 2015-12-11 NOTE — Progress Notes (Signed)
Initial Nutrition Assessment   INTERVENTION:   Meals and Snacks: Cater to patient preferences, snacks per request as pt getting per report Medical Food Supplement Therapy: will recommend on follow if intake decreased   NUTRITION DIAGNOSIS:   Inadequate oral intake related to poor appetite as evidenced by per patient/family report.  GOAL:   Patient will meet greater than or equal to 90% of their needs  MONITOR:    (Energy Intake, Electrolyte and Renal Profile, Anthropometrics, Digestive system, Pulmonary Profile)  REASON FOR ASSESSMENT:   Malnutrition Screening Tool    ASSESSMENT:   Pt admitted with SOB secondary to community-acquired pna and mild COPD exacerbation. Pt on HFNC on visit.  Past Medical History  Diagnosis Date  . Hypertension   . Hyperlipidemia   . COPD (chronic obstructive pulmonary disease) ([place])      Diet Order:  Diet Heart Room service appropriate?: Yes; Fluid consistency:: Thin    Current Nutrition: Pt reports very well since admission with snacks of [place] sandwiches or graham crackers at night. 100% of meals consumed per documentation.  Food/Nutrition-Related History: Pt reports appetite fluctuates, some days eats very well has a great appetite and others he throws food away. Pt reports good appetite since admission. No supplements PTA.   Scheduled Medications:  . amLODipine  10 mg Oral Daily  . aspirin EC  81 mg Oral Daily  . atorvastatin  40 mg Oral QHS  . azithromycin  500 mg Intravenous Q24H  . cefTRIAXone (ROCEPHIN)  IV  1 g Intravenous Q24H  . clopidogrel  75 mg Oral Daily  . enoxaparin (LOVENOX) injection  40 mg Subcutaneous Q24H  . losartan  100 mg Oral Daily   And  . hydrochlorothiazide  12.5 mg Oral Daily  . ipratropium-albuterol  3 mL Nebulization Q4H  . magnesium oxide  200 mg Oral BID  . methylPREDNISolone (SOLU-MEDROL) injection  60 mg Intravenous Q8H  . metoprolol succinate  50 mg Oral Daily  . mometasone-formoterol  2  puff Inhalation BID  . nicotine  7 mg Transdermal Daily  . pantoprazole  40 mg Oral Daily  . sodium chloride flush  3 mL Intravenous Q12H     Electrolyte/Renal Profile and Glucose Profile:   Recent Labs Lab 12/09/15 1316  NA 136  K 3.4*  CL 100*  CO2 25  BUN 16  CREATININE 0.79  CALCIUM 8.1*  GLUCOSE 174*   Protein Profile: No results for input(s): ALBUMIN in the last 168 hours.  Gastrointestinal Profile: Last BM:  12/11/2015   Nutrition-Focused Physical Exam Findings:  Unable to complete Nutrition-Focused physical exam at this time.    Weight Change: Pt reports weight of 167-169lbs recently and has been stable. Pt reports a couple of years ago weight of 190lbs. That slowly has been decreasing.    Height:   Ht Readings from Last 1 Encounters:  12/09/15 5\' 7"  (1.702 m)    Weight:   Wt Readings from Last 1 Encounters:  12/09/15 167 lb 11.2 oz (76.068 kg)    BMI:  Body mass index is 26.26 kg/(m^2).  Estimated Nutritional Needs:   Kcal:  BEE: 1523kcals, TEE: (IF 1.1-1.3)(AF 1.2) 2011-2376kcals  Protein:  76-92g protein (1.0-1.2g/kg)  Fluid:  1900-2256mL of fluid (25-86mL/kg)  EDUCATION NEEDS:   No education needs identified at this time   [place], RD, LDN Pager [phone] Weekend/On-Call Pager [phone]

## 2015-12-11 NOTE — Progress Notes (Signed)
* [facility]     Assessment and Plan:  60 year old male past medical history of COPD, hypertension, seen in consultation for acute exacerbation of COPD  AECOPD with acute bronchitis.  - Maintain O2 saturations greater than 88% -Continue  -Increase steroids to q8.  -Follow-up on sputum culture--blood and urine cultures negative thus far.  -Incentive spirometry - wean high flow nasal cannula as tolerated to maintain O2 saturations greater than 88%, currently on 75% -May consider noninvasive positive pressure ventilation i.e. BiPAP/CPAP at night during inpatient stay only   Acute hypoxic respiratory failure.  -Continue severe hypoxia with complaint of pain in the center of the chest-- will check CT chest to r/o PE.     Date: 12/11/2015  MRN# [id number] Austin Patterson Jan 06, 1956   Austin Patterson is a 60 y.o. old male seen in follow up for chief complaint of  Chief Complaint  Patient presents with  . Shortness of Breath     HPI:   Pt notes that he has had some 5/10 central chest pain earlier today, currently it is gone.    Allergies:  Review of patient's allergies indicates no known allergies.  Review of Systems: Gen:  Denies  fever, sweats. HEENT: Denies blurred vision. Cvc:  No dizziness, chest pain or heaviness Resp:   Denies cough or sputum porduction. Gi: Denies swallowing difficulty, stomach pain.  Gu:  Denies bladder incontinence,  Ext:   No Joint pain, stiffness. Skin: No skin rash, easy bruising. Endoc:  No polyuria, polydipsia. Psych: No depression, insomnia. Other:  All other systems were reviewed and found to be negative other than what is mentioned in the HPI.   Physical Examination:   VS: BP 135/73 mmHg  Pulse 81  Temp(Src) 98 F (36.7 C) (Oral)  Resp 20  Ht 5\' 7"  (1.702 m)  Wt 167 lb 11.2 oz (76.068 kg)  BMI 26.26 kg/m2  SpO2 96%  General Appearance: No distress  Neuro:without focal findings,  speech normal,    HEENT: PERRLA, EOM intact. Pulmonary: normal breath sounds, Decreased air entry bilaterally.  CardiovascularNormal S1,S2.  No m/r/g.   Abdomen: Benign, Soft, non-tender. Renal:  No costovertebral tenderness  GU:  Not performed at this time. Endoc: No evident thyromegaly, no signs of acromegaly. Skin:   warm, no rash. Extremities: normal, no cyanosis, clubbing.   LABORATORY PANEL:   CBC  Recent Labs Lab 12/09/15 1316  WBC 11.6*  HGB 11.4*  HCT 33.2*  PLT 588*   ------------------------------------------------------------------------------------------------------------------  Chemistries   Recent Labs Lab 12/09/15 1316  NA 136  K 3.4*  CL 100*  CO2 25  GLUCOSE 174*  BUN 16  CREATININE 0.79  CALCIUM 8.1*   ------------------------------------------------------------------------------------------------------------------  Cardiac Enzymes  Recent Labs Lab 12/09/15 1316  TROPONINI 0.05*   ------------------------------------------------------------  RADIOLOGY:   No results found for this or any previous visit. Results for orders placed during the hospital encounter of 09/03/15  DG Chest 2 View   Narrative CLINICAL DATA:  Chest pain for 1 month.  EXAM: CHEST  2 VIEW  COMPARISON:  01/23/2015  FINDINGS: Lungs are hyperaerated and clear, other than scarring at the right lateral base. Normal heart size. No pneumothorax. Chronic right rib deformities. Stable thoracic spine.  IMPRESSION: No active cardiopulmonary disease.   Electronically Signed   By: Marybelle Killings M.D.   On: 09/03/2015 07:56    ------------------------------------------------------------------------------------------------------------------  Thank  you for allowing [facility] to assist in the care of  your patient. Our recommendations are noted above.  Please contact us if we can be of further service.   Marda Stalker, MD.  [facility]  Patricia Pesa, M.D.  Vilinda Boehringer, M.D.  Merton Border, M.D

## 2015-12-11 NOTE — Clinical Documentation Improvement (Signed)
Internal Medicine   Would either of the following diagnosis be appropriate for this admission? Thank you   SIRS  Sepsis  Other  Clinically Undetermined    Supporting Information:  Lactic acid level 2.44 with WBC 11.4 and elev pulse and respirations on admit    Please exercise your independent, professional judgment when responding. A specific answer is not anticipated or expected.   Thank You,  [place] [phone]

## 2015-12-12 DIAGNOSIS — J8410 Pulmonary fibrosis, unspecified: Secondary | ICD-10-CM

## 2015-12-12 DIAGNOSIS — J810 Acute pulmonary edema: Secondary | ICD-10-CM

## 2015-12-12 MED ORDER — AZITHROMYCIN 250 MG PO TABS
250.0000 mg | ORAL_TABLET | Freq: Every day | ORAL | Status: DC
  Administered 2015-12-12 – 2015-12-16 (×5): 250 mg via ORAL
  Filled 2015-12-12 (×5): qty 1

## 2015-12-12 MED ORDER — CEFUROXIME AXETIL 500 MG PO TABS
500.0000 mg | ORAL_TABLET | Freq: Two times a day (BID) | ORAL | Status: DC
  Administered 2015-12-12 – 2015-12-16 (×9): 500 mg via ORAL
  Filled 2015-12-12 (×9): qty 1

## 2015-12-12 MED ORDER — FUROSEMIDE 10 MG/ML IJ SOLN
20.0000 mg | Freq: Two times a day (BID) | INTRAMUSCULAR | Status: AC
  Administered 2015-12-12 – 2015-12-13 (×2): 20 mg via INTRAVENOUS
  Filled 2015-12-12 (×2): qty 2

## 2015-12-12 NOTE — Care Management (Signed)
Patient has his 35 through [place] and the order is for continuous- not prn.  Patient has required high flow 02 earlier in the day but now on nasal cannula at 6 liters..  Patient verbalizes that he has had breathing trouble for the past year and no one referred him to a pulmonologist. He works full time.  Discussed the possibility of home health nurse follow up at discharge.  Chest CT shows a significant worsening of interstitial lung disease since April 2016

## 2015-12-12 NOTE — Progress Notes (Signed)
[place] [facility] [place] NAME: Austin Patterson    MR#:  [id number]  DATE OF BIRTH:  01-29-1956  SUBJECTIVE:  Patient with respiratory distress now on high flow nasal cannula oxygen 70% heavy smoker quit about 2 months ago Sat 100%,improving REVIEW OF SYSTEMS:   Review of Systems  Constitutional: Positive for malaise/fatigue. Negative for fever, chills and weight loss.  HENT: Negative for ear discharge, ear pain and nosebleeds.   Eyes: Negative for blurred vision, pain and discharge.  Respiratory: Positive for shortness of breath. Negative for sputum production, wheezing and stridor.   Cardiovascular: Negative for chest pain, palpitations, orthopnea and PND.  Gastrointestinal: Negative for nausea, vomiting, abdominal pain and diarrhea.  Genitourinary: Negative for urgency and frequency.  Musculoskeletal: Negative for back pain and joint pain.  Neurological: Positive for weakness. Negative for sensory change, speech change and focal weakness.  Psychiatric/Behavioral: Negative for depression and hallucinations. The patient is not nervous/anxious.   All other systems reviewed and are negative.  Tolerating Diet:yES  Tolerating PT: Pending  DRUG ALLERGIES:  No Known Allergies  VITALS:  Blood pressure 138/87, pulse 82, temperature 98.1 F (36.7 C), temperature source Oral, resp. rate 18, height 5\' 7"  (1.702 m), weight 76.068 kg (167 lb 11.2 oz), SpO2 100 %.  PHYSICAL EXAMINATION:   Physical Exam  GENERAL:  60 y.o.-year-old patient lying in the bed withmild  acute distress.  EYES: Pupils equal, round, reactive to light and accommodation. No scleral icterus. Extraocular muscles intact.  HEENT: Head atraumatic, normocephalic. Oropharynx and nasopharynx clear.  NECK:  Supple, no jugular venous distention. No thyroid enlargement, no tenderness.  LUNGS: Distant  breath sounds bilaterally,scattered  wheezing,  no rales, rhonchi. No use of  accessory muscles of respiration.  CARDIOVASCULAR: S1, S2 normal. No murmurs, rubs, or gallops.  ABDOMEN: Soft, nontender, nondistended. Bowel sounds present. No organomegaly or mass.  EXTREMITIES: No cyanosis, clubbing or edema b/l.    NEUROLOGIC: Cranial nerves II through XII are intact. No focal Motor or sensory deficits b/l.   PSYCHIATRIC:  patient is alert and oriented x 3.  SKIN: No obvious rash, lesion, or ulcer.   LABORATORY PANEL:  CBC  Recent Labs Lab 12/09/15 1316  WBC 11.6*  HGB 11.4*  HCT 33.2*  PLT 588*    Chemistries   Recent Labs Lab 12/09/15 1316  NA 136  K 3.4*  CL 100*  CO2 25  GLUCOSE 174*  BUN 16  CREATININE 0.79  CALCIUM 8.1*   Cardiac Enzymes  Recent Labs Lab 12/09/15 1316  TROPONINI 0.05*   RADIOLOGY:  Ct Angio Chest Pe W/cm &/or Wo Cm  12/11/2015  CLINICAL DATA:  Increasing shortness of breath.  Pneumonia.  COPD. EXAM: CT ANGIOGRAPHY CHEST WITH CONTRAST TECHNIQUE: Multidetector CT imaging of the chest was performed using the standard protocol during bolus administration of intravenous contrast. Multiplanar CT image reconstructions and MIPs were obtained to evaluate the vascular anatomy. CONTRAST:  23mL OMNIPAQUE IOHEXOL 350 MG/ML SOLN COMPARISON:  Portable chest dated 12/09/2015 chest CTA dated 01/28/2015. FINDINGS: Mediastinum/Lymph Nodes: Stable prominent precarinal node with a short axis diameter 13 mm on image number 57. Additional prominent mediastinal lymph nodes are not abnormally enlarged. Mildly prominent bilateral hilar lymph nodes without abnormal enlargement. Normally opacified pulmonary arteries with no pulmonary arterial filling defects seen. Lungs/Pleura: Small bilateral pleural effusions. Mild bilateral lower lobe compressive atelectasis. Extensive bullous changes in both lungs with progression. Interval significant increase in prominence of the interstitial  markings in both lungs. Patchy airspace opacity in the left lower lobe, in  addition to the compressive atelectasis. No lung nodules. Upper abdomen: Bilateral low density adrenal masses, without significant change. The mass on the right measures 2.6 x 1.9 cm on image number 135 of series 4 and the mass on the left measures 2.6 x 1.7 cm on image number 141. Mildly prominent upper abdominal lymph nodes stable. Musculoskeletal: Thoracic spine degenerative changes. Review of the MIP images confirms the above findings. IMPRESSION: 1. No pulmonary emboli. 2. Left lower lobe pneumonia. 3. Marked changes of COPD with centrilobular emphysema with progression. 4. Superimposed interstitial pulmonary edema or interstitial pneumonitis. 5. Small bilateral pleural effusions. 6. Mild bilateral lower lobe compressive atelectasis. 7. Stable bilateral adrenal adenomas. Electronically Signed   By: Claudie Revering M.D.   On: 12/11/2015 15:33   ASSESSMENT AND PLAN:  61 year old male with a history of COPD who was recently treated with antibiotics for URI presents with increasing shortness of breath and found to have pneumonia on chest x-ray  1. Acute on chronic hypoxic respiratory failure: Patient has increasing O2 requirements. He wears 2 L oxygen at bedtime. This is due to left LL pneumonia with  COPD exacerbation. -Patient currently  on high flow nasal cannula 70% FiO2 -Continue po ceftin and zithromax, Solu-Medrol, nebulizer treatment, ordered inhalers -Pulmonary consultation appreciated. No pneumonia according to their evaluation -Very slow improvement. 2. Acute LL pneumonia,CAP - Continue ceftin and zithro and follow up on blood cultures. Wean oxygen as tolerated to 2 L which is his baseline.   negative for influenza   3. COPD exacerbation, acute: Continue IV steroids, nebulizers and oxygen. Titrate to home 2 L of oxygen. Wean to [place], d/w RT  4. Hypokalemia: Replace potassium.  5. Essential hypertension: Continue Hyzaar and metoprolol.  6.Hyperlipidemia: Continue atorvastatin.  7.  Tobacco dependence:Patient is trying to quit smoking. He will continue nicotine patch. Patient counseled for 3 minutes.  Case discussed with Care Management/Social Worker. Management plans discussed with the patient, family and they are in agreement.  CODE STATUS: Full  DVT Prophylaxis: Lovenox  TOTAL TIME TAKING CARE OF THIS PATIENT 30 minutes.  >50% time spent on counselling and coordination of care  POSSIBLE D/C IN  1 DAYS, DEPENDING ON CLINICAL CONDITION.  Note: This dictation was prepared with Dragon dictation along with smaller phrase technology. Any transcriptional errors that result from this process are unintentional.  Skyelyn Scruggs M.D on 12/12/2015 at 8:22 AM  Between 7am to 6pm - Pager - [phone]  After 6pm go to [email or web address] - password [email or web address]  Office  [phone]  CC: Primary care physician; Lavonne Chick, MD

## 2015-12-12 NOTE — Progress Notes (Signed)
* [name]     Assessment and Plan:  60 year old male past medical history of COPD, hypertension, seen in consultation for acute exacerbation of COPD/acute respiratory failure.  Acute pulmonary edema with pleural effusions, with bibasilar compressive atelectasis. -We'll start diuresis.  Interstitial lung disease-possible pulmonary fibrosis. -Review of images from CT chest from 12/12/15, in comparison with previous chest CT from 01/28/15 shows that the slight changes of interstitial lung disease seen last year have progressed tremendously. This,  May represent advancing interstitial lung disease, there could be some overlying interstitial changes due to pulmonary edema.  --May be related to industrial exposures, works in [employer].    AECOPD with acute bronchitis.  Severe bullous emphysema, significantly advanced on most recent CT chest over the last 8 months. - Maintain O2 saturations greater than 88% -Screening for alpha-1 antitrypsin deficiency. -Continue Solu-Medrol steroids  q8.  -Follow-up on sputum culture--blood and urine cultures negative thus far.  -Incentive spirometry - wean oxygen as tolerated, currently on [place] at 6L. Ambulatory oxygen was check walking 15 feet, sat dropped to 76%, at rest it was 89% -May consider noninvasive positive pressure ventilation i.e. BiPAP/CPAP at night during inpatient stay only   Acute hypoxic respiratory failure.  -Will add diuresis to see if his respiratory status improves. If there is not improvement in the patient's respiratory status with diuresis and empiric steroids, his respiratory status may not improve.  Nicotine abuse. -He quit smoking about 2 months ago, discussed importance of continued smoking cessation.  Date: 12/12/2015  MRN# [id number] Austin Patterson 1956/04/02   Austin Patterson is a 60 y.o. old male seen in follow up for chief complaint of  Chief Complaint  Patient presents  with  . Shortness of Breath     HPI:   Review of Systems: Gen:  Denies  fever, sweats. HEENT: Denies blurred vision. Cvc:  No dizziness, chest pain or heaviness Resp:   Denies cough or sputum porduction. Gi: Denies swallowing difficulty, stomach pain. constipation, bowel incontinence Gu:  Denies bladder incontinence, burning urine Ext:   No Joint pain, stiffness. Skin: No skin rash, easy bruising. Endoc:  No polyuria, polydipsia. Psych: No depression, insomnia. Other:  All other systems were reviewed and found to be negative other than what is mentioned in the HPI.   Physical Examination:   VS: BP 141/76 mmHg  Pulse 80  Temp(Src) 98.3 F (36.8 C) (Oral)  Resp 22  Ht 5\' 7"  (1.702 m)  Wt 167 lb 11.2 oz (76.068 kg)  BMI 26.26 kg/m2  SpO2 90%  General Appearance: No distress  Neuro:without focal findings,  speech normal,  HEENT: PERRLA, EOM intact. Pulmonary: Decreased bilateral air entry.  CardiovascularNormal S1,S2.  No m/r/g.   Abdomen: Benign, Soft, non-tender. Renal:  No costovertebral tenderness  GU:  Not performed at this time. Endoc: No evident thyromegaly, no signs of acromegaly. Skin:   warm, no rash. Extremities: normal, no cyanosis, clubbing.   LABORATORY PANEL:   CBC  Recent Labs Lab 12/09/15 1316  WBC 11.6*  HGB 11.4*  HCT 33.2*  PLT 588*   ------------------------------------------------------------------------------------------------------------------  Chemistries   Recent Labs Lab 12/09/15 1316  NA 136  K 3.4*  CL 100*  CO2 25  GLUCOSE 174*  BUN 16  CREATININE 0.79  CALCIUM 8.1*   ------------------------------------------------------------------------------------------------------------------  Cardiac Enzymes  Recent Labs Lab 12/09/15 1316  TROPONINI 0.05*   ------------------------------------------------------------  RADIOLOGY:   No results found for this or any previous visit.  Results for orders placed during the  hospital encounter of 09/03/15  DG Chest 2 View   Narrative CLINICAL DATA:  Chest pain for 1 month.  EXAM: CHEST  2 VIEW  COMPARISON:  01/23/2015  FINDINGS: Lungs are hyperaerated and clear, other than scarring at the right lateral base. Normal heart size. No pneumothorax. Chronic right rib deformities. Stable thoracic spine.  IMPRESSION: No active cardiopulmonary disease.   Electronically Signed   By: Marybelle Killings M.D.   On: 09/03/2015 07:56    ------------------------------------------------------------------------------------------------------------------  Thank  you for allowing [facility] to assist in the care of your patient. Our recommendations are noted above.  Please contact us if we can be of further service.   Marda Stalker, MD.  [facility]  Patricia Pesa, M.D.  Vilinda Boehringer, M.D.  Merton Border, M.D

## 2015-12-13 ENCOUNTER — Telehealth: Payer: Self-pay | Admitting: *Deleted

## 2015-12-13 LAB — COMPREHENSIVE METABOLIC PANEL
ALT: 34 U/L (ref 17–63)
AST: 26 U/L (ref 15–41)
Albumin: 2.8 g/dL — ABNORMAL LOW (ref 3.5–5.0)
Alkaline Phosphatase: 74 U/L (ref 38–126)
Anion gap: 12 (ref 5–15)
BUN: 20 mg/dL (ref 6–20)
CHLORIDE: 98 mmol/L — AB (ref 101–111)
CO2: 31 mmol/L (ref 22–32)
CREATININE: 0.59 mg/dL — AB (ref 0.61–1.24)
Calcium: 8.2 mg/dL — ABNORMAL LOW (ref 8.9–10.3)
GFR calc Af Amer: >60 mL/min (ref >60)
GFR calc non Af Amer: >60 mL/min (ref >60)
GLUCOSE: 166 mg/dL — AB (ref 65–99)
Potassium: 3.3 mmol/L — ABNORMAL LOW (ref 3.5–5.1)
SODIUM: 141 mmol/L (ref 135–145)
Total Bilirubin: 0.3 mg/dL (ref 0.3–1.2)
Total Protein: 6 g/dL — ABNORMAL LOW (ref 6.5–8.1)

## 2015-12-13 MED ORDER — FUROSEMIDE 10 MG/ML IJ SOLN
20.0000 mg | Freq: Two times a day (BID) | INTRAMUSCULAR | Status: AC
  Administered 2015-12-13 – 2015-12-14 (×2): 20 mg via INTRAVENOUS
  Filled 2015-12-13 (×2): qty 2

## 2015-12-13 NOTE — Progress Notes (Signed)
[facility] at [place] NAME: Austin Patterson    MR#:  [id number]  DATE OF BIRTH:  [birth year]  SUBJECTIVE:  Patient with respiratory distress now on high flow nasal cannula oxygen 70% heavy smoker quit about 2 months ago Sat 100%,improving now on 5 L nasal cannula oxygen No fever Slow improving REVIEW OF SYSTEMS:   Review of Systems  Constitutional: Positive for malaise/fatigue. Negative for fever, chills and weight loss.  HENT: Negative for ear discharge, ear pain and nosebleeds.   Eyes: Negative for blurred vision, pain and discharge.  Respiratory: Positive for shortness of breath. Negative for sputum production, wheezing and stridor.   Cardiovascular: Negative for chest pain, palpitations, orthopnea and PND.  Gastrointestinal: Negative for nausea, vomiting, abdominal pain and diarrhea.  Genitourinary: Negative for urgency and frequency.  Musculoskeletal: Negative for back pain and joint pain.  Neurological: Positive for weakness. Negative for sensory change, speech change and focal weakness.  Psychiatric/Behavioral: Negative for depression and hallucinations. The patient is not nervous/anxious.   All other systems reviewed and are negative.  Tolerating Diet:yES  Tolerating PT: Not needed  DRUG ALLERGIES:  No Known Allergies  VITALS:  Blood pressure 122/87, pulse 89, temperature 98.2 F (36.8 C), temperature source Oral, resp. rate 20, height 5\' 7"  (1.702 m), weight 76.068 kg (167 lb 11.2 oz), SpO2 95 %.  PHYSICAL EXAMINATION:   Physical Exam  GENERAL:  60 y.o.-year-old patient lying in the bed withmild  acute distress.  EYES: Pupils equal, round, reactive to light and accommodation. No scleral icterus. Extraocular muscles intact.  HEENT: Head atraumatic, normocephalic. Oropharynx and nasopharynx clear.  NECK:  Supple, no jugular venous distention. No thyroid enlargement, no tenderness.  LUNGS: Distant  breath sounds  bilaterally,scattered  wheezing,  no rales, rhonchi. No use of accessory muscles of respiration.  CARDIOVASCULAR: S1, S2 normal. No murmurs, rubs, or gallops.  ABDOMEN: Soft, nontender, nondistended. Bowel sounds present. No organomegaly or mass.  EXTREMITIES: No cyanosis, clubbing or edema b/l.    NEUROLOGIC: Cranial nerves II through XII are intact. No focal Motor or sensory deficits b/l.   PSYCHIATRIC:  patient is alert and oriented x 3.  SKIN: No obvious rash, lesion, or ulcer.   LABORATORY PANEL:  CBC  Recent Labs Lab 12/09/15 1316  WBC 11.6*  HGB 11.4*  HCT 33.2*  PLT 588*    Chemistries   Recent Labs Lab 12/13/15 0521  NA 141  K 3.3*  CL 98*  CO2 31  GLUCOSE 166*  BUN 20  CREATININE 0.59*  CALCIUM 8.2*  AST 26  ALT 34  ALKPHOS 74  BILITOT 0.3   Cardiac Enzymes  Recent Labs Lab 12/09/15 1316  TROPONINI 0.05*   RADIOLOGY:  Ct Angio Chest Pe W/cm &/or Wo Cm  12/11/2015  CLINICAL DATA:  Increasing shortness of breath.  Pneumonia.  COPD. EXAM: CT ANGIOGRAPHY CHEST WITH CONTRAST TECHNIQUE: Multidetector CT imaging of the chest was performed using the standard protocol during bolus administration of intravenous contrast. Multiplanar CT image reconstructions and MIPs were obtained to evaluate the vascular anatomy. CONTRAST:  44mL OMNIPAQUE IOHEXOL 350 MG/ML SOLN COMPARISON:  Portable chest dated 12/09/2015 chest CTA dated 01/28/2015. FINDINGS: Mediastinum/Lymph Nodes: Stable prominent precarinal node with a short axis diameter 13 mm on image number 57. Additional prominent mediastinal lymph nodes are not abnormally enlarged. Mildly prominent bilateral hilar lymph nodes without abnormal enlargement. Normally opacified pulmonary arteries with no pulmonary arterial filling defects seen. Lungs/Pleura: Small bilateral  pleural effusions. Mild bilateral lower lobe compressive atelectasis. Extensive bullous changes in both lungs with progression. Interval significant increase in  prominence of the interstitial markings in both lungs. Patchy airspace opacity in the left lower lobe, in addition to the compressive atelectasis. No lung nodules. Upper abdomen: Bilateral low density adrenal masses, without significant change. The mass on the right measures 2.6 x 1.9 cm on image number 135 of series 4 and the mass on the left measures 2.6 x 1.7 cm on image number 141. Mildly prominent upper abdominal lymph nodes stable. Musculoskeletal: Thoracic spine degenerative changes. Review of the MIP images confirms the above findings. IMPRESSION: 1. No pulmonary emboli. 2. Left lower lobe pneumonia. 3. Marked changes of COPD with centrilobular emphysema with progression. 4. Superimposed interstitial pulmonary edema or interstitial pneumonitis. 5. Small bilateral pleural effusions. 6. Mild bilateral lower lobe compressive atelectasis. 7. Stable bilateral adrenal adenomas. Electronically Signed   By: Claudie Revering M.D.   On: 12/11/2015 15:33   ASSESSMENT AND PLAN:  60 year old male with a history of COPD who was recently treated with antibiotics for URI presents with increasing shortness of breath and found to have pneumonia on chest x-ray  1. Acute on chronic hypoxic respiratory failure with COPD and severe interstitial lung disease as noted on recent CAT scan which showed worsening lesions since this past CT scan  Patient has increasing O2 requirements. He wears 2 L oxygen at bedtime. This is due to left LL pneumonia with  COPD exacerbation. -Patient currently  on high flow nasal cannula 70% FiO2--- now on 5 L nasal cannula oxygen -Continue po ceftin and zithromax, Solu-Medrol, nebulizer treatment, ordered inhalers -Pulmonary consultation appreciated.  -Very slow improvement.  2. Acute LL pneumonia,CAP - Continue ceftin and zithro and follow up on blood cultures.  - negative for influenza  -Patient will need 4-5 L of nasal R oxygen at home to keep his sats up given his pulmonary decline  secondary to severe interstitial lung disease  3. COPD exacerbation, acute: Continue IV steroids, nebulizers and oxygen.  -Changed to by mouth steroids from tomorrow  4. Hypokalemia: Replace potassium.  5. Essential hypertension: Continue Hyzaar and metoprolol.  6.Hyperlipidemia: Continue atorvastatin.  7. Tobacco dependence:Patient is trying to quit smoking. He will continue nicotine patch. Patient counseled for 3 minutes. If remains stable over the weekend consider discharge in a day or 2 if okay with pulmonary. Case discussed with Care Management/Social Worker. Management plans discussed with the patient, family and they are in agreement.  CODE STATUS: Full  DVT Prophylaxis: Lovenox  TOTAL TIME TAKING CARE OF THIS PATIENT 30 minutes.  >50% time spent on counselling and coordination of care  POSSIBLE D/C IN  1 DAYS, DEPENDING ON CLINICAL CONDITION.  Note: This dictation was prepared with Dragon dictation along with smaller phrase technology. Any transcriptional errors that result from this process are unintentional.  Abdulla Pooley M.D on 12/13/2015 at 2:46 PM  Between 7am to 6pm - Pager - [phone]  After 6pm go to [email or web address] - password [email or web address] [facility]  Office  [phone]  CC: Primary care physician; Lavonne Chick, MD

## 2015-12-13 NOTE — Plan of Care (Signed)
Problem: Education: Goal: Knowledge of disease or condition will improve Outcome: Progressing Po antibiotics     Problem: Health Behavior/Discharge Planning: Goal: Ability to manage health-related needs will improve Outcome: Progressing Case management working with pt

## 2015-12-13 NOTE — Progress Notes (Signed)
* [facility]     Assessment and Plan:  60 year old male past medical history of COPD, hypertension, seen in consultation for acute exacerbation of COPD/acute respiratory failure.  Acute pulmonary edema with pleural effusions, with bibasilar compressive atelectasis. -Ordered Lasix 20 mg IV every 12 for another 2 doses.  Interstitial lung disease-possible pulmonary fibrosis. -CT chest from 12/12/15, in comparison with previous chest CT from 01/28/15 shows that the slight changes of interstitial lung disease seen last year have progressed tremendously. This, May represent advancing interstitial lung disease, there could be some overlying interstitial changes due to pulmonary edema.  --May be related to industrial exposures, works in [employer].  -We'll need further workup outpatient.   AECOPD with acute bronchitis.  Severe bullous emphysema, significantly advanced on most recent CT chest over the last 8 months. - Maintain O2 saturations greater than 88% -Screening for alpha-1 antitrypsin deficiency. -Continue Solu-Medrol steroids q8.  -Can change to oral steroid wean in the next 1-2 days if continues to make progress. -Follow-up on sputum culture--blood and urine cultures negative thus far.     Acute hypoxic respiratory failure.  -Secondary to above Nicotine abuse. -He quit smoking about 2 months ago, discussed importance of continued smoking cessation.   We'll see the patient again on Monday. Please call [phone], there are any acute issues over the weekend.   Date: 12/13/2015  MRN# [id number] BHAVYA SUVER [birth year]   Austin Patterson is a 60 y.o. old male seen in follow up for chief complaint of  Chief Complaint  Patient presents with  . Shortness of Breath     HPI: No new complaints today. Patient feels and looks better.   Allergies:  Review of patient's allergies indicates no known allergies.  Review of  Systems: Gen:  Denies  fever, sweats. HEENT: Denies blurred vision. Cvc:  No dizziness, chest pain or heaviness Resp:   Denies cough or sputum porduction. Gi: Denies swallowing difficulty, stomach pain. constipation, bowel incontinence Gu:  Denies bladder incontinence, burning urine Ext:   No Joint pain, stiffness. Skin: No skin rash, easy bruising. Endoc:  No polyuria, polydipsia. Psych: No depression, insomnia. Other:  All other systems were reviewed and found to be negative other than what is mentioned in the HPI.   Physical Examination:   VS: BP 122/87 mmHg  Pulse 89  Temp(Src) 98.2 F (36.8 C) (Oral)  Resp 20  Ht 5\' 7"  (1.702 m)  Wt 167 lb 11.2 oz (76.068 kg)  BMI 26.26 kg/m2  SpO2 95%  General Appearance: No distress  Neuro:without focal findings,  speech normal,  HEENT: PERRLA, EOM intact. Pulmonary: normal breath sounds, No wheezing.   CardiovascularNormal S1,S2.  No m/r/g.   Abdomen: Benign, Soft, non-tender. Renal:  No costovertebral tenderness  GU:  Not performed at this time. Endoc: No evident thyromegaly, no signs of acromegaly. Skin:   warm, no rash. Extremities: normal, no cyanosis, clubbing.   LABORATORY PANEL:   CBC  Recent Labs Lab 12/09/15 1316  WBC 11.6*  HGB 11.4*  HCT 33.2*  PLT 588*   ------------------------------------------------------------------------------------------------------------------  Chemistries   Recent Labs Lab 12/13/15 0521  NA 141  K 3.3*  CL 98*  CO2 31  GLUCOSE 166*  BUN 20  CREATININE 0.59*  CALCIUM 8.2*  AST 26  ALT 34  ALKPHOS 74  BILITOT 0.3   ------------------------------------------------------------------------------------------------------------------  Cardiac Enzymes  Recent Labs Lab 12/09/15 1316  TROPONINI 0.05*   ------------------------------------------------------------  RADIOLOGY:   No  results found for this or any previous visit. Results for orders placed during the  hospital encounter of 09/03/15  DG Chest 2 View   Narrative CLINICAL DATA:  Chest pain for 1 month.  EXAM: CHEST  2 VIEW  COMPARISON:  01/23/2015  FINDINGS: Lungs are hyperaerated and clear, other than scarring at the right lateral base. Normal heart size. No pneumothorax. Chronic right rib deformities. Stable thoracic spine.  IMPRESSION: No active cardiopulmonary disease.   Electronically Signed   By: Marybelle Killings M.D.   On: 09/03/2015 07:56    ------------------------------------------------------------------------------------------------------------------  Thank  you for allowing [facility] to assist in the care of your patient. Our recommendations are noted above.  Please contact us if we can be of further service.   Marda Stalker, MD.  [facility] Office Number: [phone]  Patricia Pesa, M.D.  Vilinda Boehringer, M.D.  Merton Border, M.D

## 2015-12-13 NOTE — Care Management (Signed)
Anticipate discharge over the weekend.  Discussed home health with attending.  Patient is not sure if he wishes to have service.  Discussed that patient needs to initiate disability application as his pulmonary status is going to prevent him from working.  Currently on 5 iters nasal cannula.  Obtained order for increase on liter flow and faxed to [employer]

## 2015-12-13 NOTE — Telephone Encounter (Signed)
-----   Message from Laverle Hobby, MD sent at 12/13/2015 12:37 PM EST ----- Needs fu with me in about 2 weeks.

## 2015-12-13 NOTE — Telephone Encounter (Signed)
Spoke with Serenity on the unit and gave F/U appt for pt. appt is 12/27/15 @ 9am with DR. Nothing further needed.

## 2015-12-14 LAB — CULTURE, BLOOD (ROUTINE X 2)
CULTURE: NO GROWTH
Culture: NO GROWTH

## 2015-12-14 MED ORDER — METHYLPREDNISOLONE SODIUM SUCC 40 MG IJ SOLR
40.0000 mg | Freq: Two times a day (BID) | INTRAMUSCULAR | Status: AC
  Administered 2015-12-15 – 2015-12-16 (×3): 40 mg via INTRAVENOUS
  Filled 2015-12-14 (×3): qty 1

## 2015-12-14 NOTE — Progress Notes (Signed)
[facility] at [place] NAME: Austin Patterson    MR#:  [id number]  DATE OF BIRTH:  20-May-1956  SUBJECTIVE:  Patient with respiratory distress , clinically improving ,weaning off oxygen now on 4 L via nasal cannula   heavy smoker quit about 2 months ago Slow improving REVIEW OF SYSTEMS:   Review of Systems  Constitutional: Positive for malaise/fatigue. Negative for fever, chills and weight loss.  HENT: Negative for ear discharge, ear pain and nosebleeds.   Eyes: Negative for blurred vision, pain and discharge.  Respiratory: Positive for shortness of breath. Negative for sputum production, wheezing and stridor.   Cardiovascular: Negative for chest pain, palpitations, orthopnea and PND.  Gastrointestinal: Negative for nausea, vomiting, abdominal pain and diarrhea.  Genitourinary: Negative for urgency and frequency.  Musculoskeletal: Negative for back pain and joint pain.  Neurological: Positive for weakness. Negative for sensory change, speech change and focal weakness.  Psychiatric/Behavioral: Negative for depression and hallucinations. The patient is not nervous/anxious.   All other systems reviewed and are negative.  Tolerating Diet:yES  Tolerating PT: Not needed  DRUG ALLERGIES:  No Known Allergies  VITALS:  Blood pressure 135/68, pulse 75, temperature 98 F (36.7 C), temperature source Oral, resp. rate 16, height 5\' 7"  (1.702 m), weight 76.068 kg (167 lb 11.2 oz), SpO2 96 %.  PHYSICAL EXAMINATION:   Physical Exam  GENERAL:  60 y.o.-year-old patient lying in the bed withmild  acute distress.  EYES: Pupils equal, round, reactive to light and accommodation. No scleral icterus. Extraocular muscles intact.  HEENT: Head atraumatic, normocephalic. Oropharynx and nasopharynx clear.  NECK:  Supple, no jugular venous distention. No thyroid enlargement, no tenderness.  LUNGS: Distant  breath sounds bilaterally,scattered  minimal end  expiratory wheezing,  no rales, rhonchi. No use of accessory muscles of respiration.  CARDIOVASCULAR: S1, S2 normal. No murmurs, rubs, or gallops.  ABDOMEN: Soft, nontender, nondistended. Bowel sounds present. No organomegaly or mass.  EXTREMITIES: No cyanosis, clubbing or edema b/l.    NEUROLOGIC: Cranial nerves II through XII are intact. No focal Motor or sensory deficits b/l.   PSYCHIATRIC:  patient is alert and oriented x 3.  SKIN: No obvious rash, lesion, or ulcer.   LABORATORY PANEL:  CBC  Recent Labs Lab 12/09/15 1316  WBC 11.6*  HGB 11.4*  HCT 33.2*  PLT 588*    Chemistries   Recent Labs Lab 12/13/15 0521  NA 141  K 3.3*  CL 98*  CO2 31  GLUCOSE 166*  BUN 20  CREATININE 0.59*  CALCIUM 8.2*  AST 26  ALT 34  ALKPHOS 74  BILITOT 0.3   Cardiac Enzymes  Recent Labs Lab 12/09/15 1316  TROPONINI 0.05*   RADIOLOGY:  No results found. ASSESSMENT AND PLAN:  60 year old male with a history of COPD who was recently treated with antibiotics for URI presents with increasing shortness of breath and found to have pneumonia on chest x-ray  1. Acute on chronic hypoxic respiratory failure with COPD and severe interstitial lung disease as noted on recent CAT scan which showed worsening lesions since this past CT scan  Patient has increasing O2 requirements. He wears 2 L oxygen at bedtime. This is due to left LL pneumonia with  COPD exacerbation. -Patient currently  on 4 L nasal cannula oxygen -Continue po ceftin and zithromax, taper Solu-Medrol, nebulizer treatment, ordered inhalers -Pulmonary consultation appreciated.  -Very slow improvement.  2. Acute LL pneumonia,CAP - Continue ceftin and zithro and follow  up on blood cultures no growth in 5 days - negative for influenza  -Patient will need  nasal oxygen at home to keep his sats up given his pulmonary decline secondary to severe interstitial lung disease, was on 2 L of oxygen via nasal cannula at home prior to this  admission  3. COPD exacerbation, acute: Continue IV steroids, nebulizers and oxygen.  -Taper steroids   4. Hypokalemia: Replace potassium.  5. Essential hypertension: Continue Hyzaar and metoprolol.  6.Hyperlipidemia: Continue atorvastatin.  7. Tobacco dependence:Patient is trying to quit smoking. He will continue nicotine patch.   If remains stable over the weekend consider discharge in a day or 2 if okay with pulmonary. Case discussed with Care Management/Social Worker. Management plans discussed with the patient, family and they are in agreement.  CODE STATUS: Full  DVT Prophylaxis: Lovenox  TOTAL TIME TAKING CARE OF THIS PATIENT 30 minutes.  >50% time spent on counselling and coordination of care  POSSIBLE D/C IN  1 DAYS, DEPENDING ON CLINICAL CONDITION.  Note: This dictation was prepared with Dragon dictation along with smaller phrase technology. Any transcriptional errors that result from this process are unintentional.  Nicholes Mango M.D on 12/14/2015 at 3:40 PM  Between 7am to 6pm - Pager - [phone]  After 6pm go to [email or web address] - password [email or web address] [facility]  Office  [phone]  CC: Primary care physician; Lavonne Chick, MD

## 2015-12-15 LAB — BASIC METABOLIC PANEL
Anion gap: 7 (ref 5–15)
BUN: 23 mg/dL — AB (ref 6–20)
CALCIUM: 8.4 mg/dL — AB (ref 8.9–10.3)
CO2: 32 mmol/L (ref 22–32)
Chloride: 98 mmol/L — ABNORMAL LOW (ref 101–111)
Creatinine, Ser: 0.6 mg/dL — ABNORMAL LOW (ref 0.61–1.24)
GFR calc Af Amer: >60 mL/min (ref >60)
GLUCOSE: 137 mg/dL — AB (ref 65–99)
Potassium: 4 mmol/L (ref 3.5–5.1)
Sodium: 137 mmol/L (ref 135–145)

## 2015-12-15 LAB — CBC
HEMATOCRIT: 33.9 % — AB (ref 40.0–52.0)
Hemoglobin: 11.3 g/dL — ABNORMAL LOW (ref 13.0–18.0)
MCH: 31.1 pg (ref 26.0–34.0)
MCHC: 33.3 g/dL (ref 32.0–36.0)
MCV: 93.3 fL (ref 80.0–100.0)
PLATELETS: 793 10*3/uL — AB (ref 150–440)
RBC: 3.64 MIL/uL — AB (ref 4.40–5.90)
RDW: 15.3 % — AB (ref 11.5–14.5)
WBC: 11.3 10*3/uL — ABNORMAL HIGH (ref 3.8–10.6)

## 2015-12-15 MED ORDER — ENOXAPARIN SODIUM 40 MG/0.4ML ~~LOC~~ SOLN
40.0000 mg | SUBCUTANEOUS | Status: DC
  Administered 2015-12-15: 40 mg via SUBCUTANEOUS
  Filled 2015-12-15: qty 0.4

## 2015-12-15 NOTE — Progress Notes (Signed)
[facility] at [place] NAME: Austin Patterson    MR#:  [id number]  DATE OF BIRTH:  [birth year]  SUBJECTIVE:  Patient with respiratory distress , clinically improving ,weaning off oxygen now on 3 L via nasal cannula while resting, reporting sob with min exertion   heavy smoker quit about 2 months ago Slow improving REVIEW OF SYSTEMS:   Review of Systems  Constitutional: Positive for malaise/fatigue. Negative for fever, chills and weight loss.  HENT: Negative for ear discharge, ear pain and nosebleeds.   Eyes: Negative for blurred vision, pain and discharge.  Respiratory: Positive for shortness of breath. Negative for sputum production, wheezing and stridor.        Exertional dyspnea  Cardiovascular: Negative for chest pain, palpitations, orthopnea and PND.  Gastrointestinal: Negative for nausea, vomiting, abdominal pain and diarrhea.  Genitourinary: Negative for urgency and frequency.  Musculoskeletal: Negative for back pain and joint pain.  Neurological: Positive for weakness. Negative for sensory change, speech change and focal weakness.  Psychiatric/Behavioral: Negative for depression and hallucinations. The patient is not nervous/anxious.   All other systems reviewed and are negative.  Tolerating Diet:yES  Tolerating PT: Not needed  DRUG ALLERGIES:  No Known Allergies  VITALS:  Blood pressure 135/73, pulse 80, temperature 98.1 F (36.7 C), temperature source Oral, resp. rate 18, height 5\' 7"  (1.702 m), weight 76.068 kg (167 lb 11.2 oz), SpO2 96 %.  PHYSICAL EXAMINATION:   Physical Exam  GENERAL:  60 y.o.-year-old patient lying in the bed withmild  acute distress.  EYES: Pupils equal, round, reactive to light and accommodation. No scleral icterus. Extraocular muscles intact.  HEENT: Head atraumatic, normocephalic. Oropharynx and nasopharynx clear.  NECK:  Supple, no jugular venous distention. No thyroid enlargement, no  tenderness.  LUNGS: Moderate air entry  bilaterally, no wheezing,  no rales, rhonchi. No use of accessory muscles of respiration.  CARDIOVASCULAR: S1, S2 normal. No murmurs, rubs, or gallops.  ABDOMEN: Soft, nontender, nondistended. Bowel sounds present. No organomegaly or mass.  EXTREMITIES: No cyanosis, clubbing or edema b/l.    NEUROLOGIC: Cranial nerves II through XII are intact. No focal Motor or sensory deficits b/l.   PSYCHIATRIC:  patient is alert and oriented x 3.  SKIN: No obvious rash, lesion, or ulcer.   LABORATORY PANEL:  CBC  Recent Labs Lab 12/15/15 0607  WBC 11.3*  HGB 11.3*  HCT 33.9*  PLT 793*    Chemistries   Recent Labs Lab 12/13/15 0521 12/15/15 0607  NA 141 137  K 3.3* 4.0  CL 98* 98*  CO2 31 32  GLUCOSE 166* 137*  BUN 20 23*  CREATININE 0.59* 0.60*  CALCIUM 8.2* 8.4*  AST 26  --   ALT 34  --   ALKPHOS 74  --   BILITOT 0.3  --    Cardiac Enzymes  Recent Labs Lab 12/09/15 1316  TROPONINI 0.05*   RADIOLOGY:  No results found. ASSESSMENT AND PLAN:  60 year old male with a history of COPD who was recently treated with antibiotics for URI presents with increasing shortness of breath and found to have pneumonia on chest x-ray  1. Acute on chronic hypoxic respiratory failure with COPD and severe interstitial lung disease as noted on recent CAT scan which showed worsening lesions since this past CT scan  On  2 L oxygen at bedtime at home. This is due to left LL pneumonia with  COPD exacerbation. -Patient currently  on 3 L nasal cannula  oxygen , wean off as tolerated -Continue po ceftin and zithromax, taper Solu-Medrol, nebulizer treatment, ordered inhalers -Pulmonary consultation appreciated. Recommended pt to f/u with pulm as an op , work excuse until then as pt cant work with o2 at his work place  -Very slow improvement.  2. Acute LL pneumonia,CAP - Continue ceftin and zithro and follow up on blood cultures no growth in 5 days - negative  for influenza  -Patient will need  nasal oxygen at home to keep his sats up given his pulmonary decline secondary to severe interstitial lung disease, was on 2 L of oxygen via nasal cannula at home prior to this admission  3. COPD exacerbation, acute: Continue IV steroids, nebulizers and oxygen.  -Taper steroids   4. Hypokalemia: Replace potassium.  5. Essential hypertension: Continue Hyzaar and metoprolol.  6.Hyperlipidemia: Continue atorvastatin.  7. Tobacco dependence:Patient is trying to quit smoking. He will continue nicotine patch.   If remains stable over the weekend consider discharge in a day or 2 if okay with pulmonary  .PT recommends OP PT and cardiac rehab Case discussed with Care Management/Social Worker. Management plans discussed with the patient, family and they are in agreement.  CODE STATUS: Full  DVT Prophylaxis: Lovenox  TOTAL TIME TAKING CARE OF THIS PATIENT 60 minutes.  >50% time spent on counselling and coordination of care  POSSIBLE D/C IN  1 DAYS, DEPENDING ON CLINICAL CONDITION.  Note: This dictation was prepared with Dragon dictation along with smaller phrase technology. Any transcriptional errors that result from this process are unintentional.  Nicholes Mango M.D on 12/15/2015 at 7:26 PM  Between 7am to 6pm - Pager - [phone]  After 6pm go to [email or web address] - password [email or web address] [facility]  Office  [phone]  CC: Primary care physician; Lavonne Chick, MD

## 2015-12-15 NOTE — Evaluation (Signed)
Physical Therapy Evaluation Patient Details Name: Austin Patterson MRN: [id number] DOB: 1955/12/03 Today's Date: 12/15/2015   History of Present Illness  60 yo male with hypoxic respiratory failure has been exposed to oil in work air, smoker and now has PNA possibly from pneumonitis.  Has coronary stent, spondylosis and acromioplasty in history.  Clinical Impression  Pt was up to walk with significant difficulty maintaining O2 when he was up without O2 earlier.  He has his own pulse ox and noted when O2 off to go to BR was down as low as 77%.  Talked with him about maintaining O2 with all mobility, and he was able to stay at 92% by the end of a walk with PT.  He is definitely a cardiac rehab candidate due to his LT issues with lung function.    Follow Up Recommendations Outpatient PT;Other (comment) (consider cardiac rehab)    Equipment Recommendations  None recommended by PT (has a walker)    Recommendations for Other Services Rehab consult     Precautions / Restrictions Precautions Precautions: Fall (telemetry) Restrictions Weight Bearing Restrictions: No      Mobility  Bed Mobility Overal bed mobility: Modified Independent                Transfers Overall transfer level: Modified independent Equipment used: Rolling walker (2 wheeled)             General transfer comment: good hand placement with reminders  Ambulation/Gait Ambulation/Gait assistance: Supervision;Min guard Ambulation Distance (Feet): 300 Feet Assistive device: 1 person hand held assist (O2 monitor cart to support tank) Gait Pattern/deviations: Step-through pattern;Decreased stride length;Wide base of support (using 3L O2 via nasal cannula) Gait velocity: reduced Gait velocity interpretation: Below normal speed for age/gender General Gait Details: no signs of LOB  Stairs            Wheelchair Mobility    Modified Rankin (Stroke Patients Only)       Balance Overall balance  assessment: Needs assistance Sitting-balance support: Feet supported Sitting balance-Leahy Scale: Good     Standing balance support: Bilateral upper extremity supported Standing balance-Leahy Scale: Fair                               Pertinent Vitals/Pain Pain Assessment: No/denies pain    Home Living Family/patient expects to be discharged to:: Private residence Living Arrangements: Spouse/significant other Available Help at Discharge: Family;Available 24 hours/day Type of Home: House Home Access: Stairs to enter       Home Equipment: Walker - 2 wheels Additional Comments: Pt was on O2 sporadically and likely had daytime needs that he did not meet to allow for work    Prior Function Level of Independence: Independent               Journalist, newspaper        Extremity/Trunk Assessment   Upper Extremity Assessment: Generalized weakness           Lower Extremity Assessment: Generalized weakness      Cervical / Trunk Assessment: Normal  Communication   Communication: No difficulties  Cognition Arousal/Alertness: Awake/alert Behavior During Therapy: WFL for tasks assessed/performed Overall Cognitive Status: Within Functional Limits for tasks assessed                      General Comments General comments (skin integrity, edema, etc.): Pt is getting up to walk with PT helping  mainly wiht O2 tank and discussed with pt about his need for O2.  He is worried about work, and does not think he can return on the tank    Exercises        Assessment/Plan    PT Assessment Patient needs continued PT services  PT Diagnosis Difficulty walking   PT Problem List Decreased strength;Decreased range of motion;Decreased activity tolerance;Decreased balance;Decreased mobility;Decreased coordination;Decreased knowledge of use of DME;Decreased safety awareness;Cardiopulmonary status limiting activity;Decreased skin integrity  PT Treatment Interventions DME  instruction;Gait training;Stair training;Functional mobility training;Therapeutic activities;Therapeutic exercise;Balance training;Neuromuscular re-education;Patient/family education   PT Goals (Current goals can be found in the Care Plan section) Acute Rehab PT Goals Patient Stated Goal: To get back to work PT Goal Formulation: With patient Time For Goal Achievement: 12/29/15 Potential to Achieve Goals: Good    Frequency Min 2X/week   Barriers to discharge Decreased caregiver support;Inaccessible home environment      Co-evaluation               End of Session Equipment Utilized During Treatment: Gait belt;Oxygen Activity Tolerance: Patient tolerated treatment well;Patient limited by fatigue Patient left: in bed;with call bell/phone within reach;with bed alarm set;with family/visitor present Nurse Communication: Mobility status         Time: [id number] PT Time Calculation (min) (ACUTE ONLY): 30 min   Charges:   PT Evaluation $PT Eval Low Complexity: 1 Procedure PT Treatments $Gait Training: 8-22 mins   PT G Codes:        Ramond Dial 12/29/2015, 6:14 PM   Mee Hives, PT MS Acute Rehab Dept. Number: [facility] [id number] and [place] [phone]

## 2015-12-16 LAB — BASIC METABOLIC PANEL
ANION GAP: 7 (ref 5–15)
BUN: 25 mg/dL — ABNORMAL HIGH (ref 6–20)
CALCIUM: 8.5 mg/dL — AB (ref 8.9–10.3)
CO2: 33 mmol/L — ABNORMAL HIGH (ref 22–32)
Chloride: 98 mmol/L — ABNORMAL LOW (ref 101–111)
Creatinine, Ser: 0.7 mg/dL (ref 0.61–1.24)
GLUCOSE: 111 mg/dL — AB (ref 65–99)
POTASSIUM: 4.1 mmol/L (ref 3.5–5.1)
SODIUM: 138 mmol/L (ref 135–145)

## 2015-12-16 LAB — CBC
HCT: 33.7 % — ABNORMAL LOW (ref 40.0–52.0)
Hemoglobin: 11.4 g/dL — ABNORMAL LOW (ref 13.0–18.0)
MCH: 31.8 pg (ref 26.0–34.0)
MCHC: 33.7 g/dL (ref 32.0–36.0)
MCV: 94.5 fL (ref 80.0–100.0)
PLATELETS: 782 10*3/uL — AB (ref 150–440)
RBC: 3.57 MIL/uL — AB (ref 4.40–5.90)
RDW: 15.6 % — ABNORMAL HIGH (ref 11.5–14.5)
WBC: 10.4 10*3/uL (ref 3.8–10.6)

## 2015-12-16 MED ORDER — ALBUTEROL SULFATE HFA 108 (90 BASE) MCG/ACT IN AERS: 2.0000 | INHALATION_SPRAY | RESPIRATORY_TRACT | Status: DC | PRN

## 2015-12-16 MED ORDER — SODIUM CHLORIDE 0.9% FLUSH
3.0000 mL | INTRAVENOUS | Status: DC | PRN
  Administered 2015-12-16 (×2): 3 mL via INTRAVENOUS
  Filled 2015-12-16 (×2): qty 3

## 2015-12-16 MED ORDER — ACETAMINOPHEN 325 MG PO TABS: 650.0000 mg | ORAL_TABLET | Freq: Four times a day (QID) | ORAL | Status: DC | PRN

## 2015-12-16 MED ORDER — HYDROCODONE-ACETAMINOPHEN 5-325 MG PO TABS: 1.0000 | ORAL_TABLET | ORAL | Status: DC | PRN

## 2015-12-16 MED ORDER — FLUTICASONE-SALMETEROL 250-50 MCG/DOSE IN AEPB: 1.0000 | INHALATION_SPRAY | Freq: Two times a day (BID) | RESPIRATORY_TRACT | Status: DC

## 2015-12-16 NOTE — Care Management (Signed)
Attending states there are no discharge needs.  Patient is very knowledgeable about his medical condition, intervention for compromise and medications.

## 2015-12-16 NOTE — Progress Notes (Signed)
[place], [place].   12/16/2015  Patient: Austin Patterson   Date of Birth:  Feb 15, 1956  Date of admission:  12/09/2015  Date of Discharge  12/16/2015    To Whom it May Concern:   Austin Patterson is excused from work until seen and cleared by pulmonologist Dr. Ashby Dawes on March 10 at 9 AM  PHYSICAL ACTIVITY:  Moderate with 3 lit of home o2  If you have any questions or concerns, please don't hesitate to call.  Sincerely,   Nicholes Mango M.D Pager [phone] Office : [phone]   .

## 2015-12-16 NOTE — Discharge Instructions (Signed)
Heart Failure Clinic appointment on December 30, 2015 at 9:00am with Darylene Price, [place]. Please call [phone] to reschedule.  Follow up with pulmonology Dr. Ashby Dawes 3/10  9 am  Follow-up with primary care physician in a week Follow-up with cardiac rehabilitation as scheduled Continue 3 L of oxygen via nasal cannula Activity as tolerated Diet low-salt

## 2015-12-16 NOTE — Progress Notes (Signed)
Initial appointment made at the [facility] on December 30, 2015 at 9:00am. Thank you for the referral.

## 2015-12-16 NOTE — Discharge Summary (Signed)
[name] [facility] [place] NAME: Austin Patterson    MR#:  [id number]  DATE OF BIRTH:  [birth year]  DATE OF ADMISSION:  12/09/2015 ADMITTING PHYSICIAN: Bettey Costa, MD  DATE OF DISCHARGE: 12/16/15 PRIMARY CARE PHYSICIAN: Lavonne Chick, MD    ADMISSION DIAGNOSIS:  CAP (community acquired pneumonia) [J18.9] Acute exacerbation of COPD with asthma ([place]) [J44.1, J45.901]  DISCHARGE DIAGNOSIS:  Active Problems:   Pneumonia   SECONDARY DIAGNOSIS:   Past Medical History  Diagnosis Date  . Hypertension   . Hyperlipidemia   . COPD (chronic obstructive pulmonary disease) [facility])     HOSPITAL COURSE:  60 year old male with a history of COPD who was recently treated with antibiotics for URI presents with increasing shortness of breath and found to have pneumonia on chest x-ray  1. Acute on chronic hypoxic respiratory failure with COPD and severe interstitial lung disease as noted on recent CAT scan which showed worsening lesions since this past CT scan  On 2 L oxygen at bedtime at home.needs 3 lit of o2 with exertion  This is due to left LL pneumonia with COPD exacerbation.  -Continue po ceftin and zithromax, tapered Solu-Medrol, nebulizer treatment, ordered inhalers -Pulmonary consultation appreciated. Recommended pt to f/u with pulm as an op , work excuse until then as pt cant work with o2 at his work place  -Very slow improvement.  2. Acute LL pneumonia,CAP - Continue ceftin and zithro and follow up on blood cultures no growth in 5 days - negative for influenza  -Patient will need nasal oxygen at home to keep his sats up given his pulmonary decline secondary to severe interstitial lung disease, was on 2 L of oxygen via nasal cannula at home prior to this admission  3. COPD exacerbation, acute:  Improved with steroids, nebulizers and oxygen.  -Taper steroids   4. Hypokalemia: Replace potassium prn  5. Essential hypertension:  Continue Hyzaar and metoprolol.  6.Hyperlipidemia: Continue atorvastatin.  7. Tobacco dependence:Patient is trying to quit smoking. He will continue nicotine patch.    PT recommends OP PT and cardiac rehab   DISCHARGE CONDITIONS:   fair  CONSULTS OBTAINED:  Treatment Team:  Vilinda Boehringer, MD   PROCEDURES none  DRUG ALLERGIES:  No Known Allergies  DISCHARGE MEDICATIONS:   Current Discharge Medication List    START taking these medications   Details  acetaminophen (TYLENOL) 325 MG tablet Take 2 tablets (650 mg total) by mouth every 6 (six) hours as needed for mild pain (or Fever >/= 101).    HYDROcodone-acetaminophen (NORCO/VICODIN) 5-325 MG tablet Take 1-2 tablets by mouth every 4 (four) hours as needed for moderate pain. Qty: 20 tablet, Refills: 0      CONTINUE these medications which have CHANGED   Details  albuterol (PROVENTIL HFA;VENTOLIN HFA) 108 (90 Base) MCG/ACT inhaler Inhale 2 puffs into the lungs every 4 (four) hours as needed for wheezing or shortness of breath. Qty: 1 Inhaler, Refills: 2    Fluticasone-Salmeterol (ADVAIR DISKUS) 250-50 MCG/DOSE AEPB Inhale 1 puff into the lungs 2 (two) times daily. Qty: 60 each, Refills: 0      CONTINUE these medications which have NOT CHANGED   Details  amLODipine (NORVASC) 10 MG tablet Take 10 mg by mouth daily.    aspirin EC 81 MG tablet Take 81 mg by mouth daily.    atorvastatin (LIPITOR) 40 MG tablet Take 40 mg by mouth at bedtime.    clopidogrel (PLAVIX) 75 MG tablet  Take 75 mg by mouth daily.    loratadine (CLARITIN) 10 MG tablet Take 10 mg by mouth daily.    losartan-hydrochlorothiazide (HYZAAR) 100-12.5 MG tablet Take 1 tablet by mouth daily.    metoprolol succinate (TOPROL-XL) 50 MG 24 hr tablet Take 50 mg by mouth daily.    nicotine (NICODERM CQ - DOSED IN MG/24 HOURS) 21 mg/24hr patch Place 1 patch (21 mg total) onto the skin daily. Qty: 28 patch, Refills: 0    pantoprazole (PROTONIX) 40 MG tablet  Take 40 mg by mouth daily.         DISCHARGE INSTRUCTIONS:   Heart Failure Clinic appointment on December 30, 2015 at 9:00am with Darylene Price, [place]. Please call [phone] to reschedule.  Follow up with pulmonology Dr. Ashby Dawes 3/10  9 am  Follow-up with primary care physician in a week Follow-up with cardiac rehabilitation as scheduled Continue 3 L of oxygen via nasal cannula Activity as tolerated Diet low-salt   DIET:  Low salt  DISCHARGE CONDITION:  fair  ACTIVITY:  As tolerated with 3 lit of o2   OXYGEN:  Home Oxygen: 3 lit   Oxygen Delivery: 3 lit via [place]  DISCHARGE LOCATION:  [facility]  If you experience worsening of your admission symptoms, develop shortness of breath, life threatening emergency, suicidal or homicidal thoughts you must seek medical attention immediately by calling 911 or calling your MD immediately  if symptoms less severe.  You Must read complete instructions/literature along with all the possible adverse reactions/side effects for all the Medicines you take and that have been prescribed to you. Take any new Medicines after you have completely understood and accpet all the possible adverse reactions/side effects.   Please note  You were cared for by a hospitalist during your hospital stay. If you have any questions about your discharge medications or the care you received while you were in the hospital after you are discharged, you can call the unit and asked to speak with the hospitalist on call if the hospitalist that took care of you is not available. Once you are discharged, your primary care physician will handle any further medical issues. Please note that NO REFILLS for any discharge medications will be authorized once you are discharged, as it is imperative that you return to your primary care physician (or establish a relationship with a primary care physician if you do not have one) for your aftercare needs so that they can reassess your need  for medications and monitor your lab values.     Today  Chief Complaint  Patient presents with  . Shortness of Breath   Pt is feeling better, needing 3 lit of o2 via [place]  ROS:  CONSTITUTIONAL: Denies fevers, chills. Denies any fatigue, weakness.  EYES: Denies blurry vision, double vision, eye pain. EARS, NOSE, THROAT: Denies tinnitus, ear pain, hearing loss. RESPIRATORY: Denies cough, wheeze, shortness of breath.  CARDIOVASCULAR: Denies chest pain, palpitations, edema.  GASTROINTESTINAL: Denies nausea, vomiting, diarrhea, abdominal pain. Denies bright red blood per rectum. GENITOURINARY: Denies dysuria, hematuria. ENDOCRINE: Denies nocturia or thyroid problems. HEMATOLOGIC AND LYMPHATIC: Denies easy bruising or bleeding. SKIN: Denies rash or lesion. MUSCULOSKELETAL: Denies pain in neck, back, shoulder, knees, hips or arthritic symptoms.  NEUROLOGIC: Denies paralysis, paresthesias.  PSYCHIATRIC: Denies anxiety or depressive symptoms.   VITAL SIGNS:  Blood pressure 119/69, pulse 82, temperature 97.6 F (36.4 C), temperature source Oral, resp. rate 16, height 5\' 7"  (1.702 m), weight 76.068 kg (167 lb 11.2 oz), SpO2 99 %.  I/O:   Intake/Output Summary (Last 24 hours) at 12/16/15 1256 Last data filed at 12/16/15 1020  Gross per 24 hour  Intake   1080 ml  Output   1950 ml  Net   -870 ml    PHYSICAL EXAMINATION:  GENERAL:  60 y.o.-year-old patient lying in the bed with no acute distress.  EYES: Pupils equal, round, reactive to light and accommodation. No scleral icterus. Extraocular muscles intact.  HEENT: Head atraumatic, normocephalic. Oropharynx and nasopharynx clear.  NECK:  Supple, no jugular venous distention. No thyroid enlargement, no tenderness.  LUNGS: Normal breath sounds bilaterally, no wheezing, rales,rhonchi or crepitation. No use of accessory muscles of respiration.  CARDIOVASCULAR: S1, S2 normal. No murmurs, rubs, or gallops.  ABDOMEN: Soft, non-tender,  non-distended. Bowel sounds present. No organomegaly or mass.  EXTREMITIES: No pedal edema, cyanosis, or clubbing.  NEUROLOGIC: Cranial nerves II through XII are intact. Muscle strength 5/5 in all extremities. Sensation intact. Gait not checked.  PSYCHIATRIC: The patient is alert and oriented x 3.  SKIN: No obvious rash, lesion, or ulcer.   DATA REVIEW:   CBC  Recent Labs Lab 12/16/15 0447  WBC 10.4  HGB 11.4*  HCT 33.7*  PLT 782*    Chemistries   Recent Labs Lab 12/13/15 0521  12/16/15 0447  NA 141  < > 138  K 3.3*  < > 4.1  CL 98*  < > 98*  CO2 31  < > 33*  GLUCOSE 166*  < > 111*  BUN 20  < > 25*  CREATININE 0.59*  < > 0.70  CALCIUM 8.2*  < > 8.5*  AST 26  --   --   ALT 34  --   --   ALKPHOS 74  --   --   BILITOT 0.3  --   --   < > = values in this interval not displayed.  Cardiac Enzymes  Recent Labs Lab 12/09/15 1316  TROPONINI 0.05*    Microbiology Results  Results for orders placed or performed during the hospital encounter of 12/09/15  Blood Culture (routine x 2)     Status: None   Collection Time: 12/09/15  2:03 PM  Result Value Ref Range Status   Specimen Description BLOOD LEFT WRIST  Final   Special Requests   Final    BOTTLES DRAWN AEROBIC AND ANAEROBIC AER 4ML ANA 3ML   Culture NO GROWTH 5 DAYS  Final   Report Status 12/14/2015 FINAL  Final  Blood Culture (routine x 2)     Status: None   Collection Time: 12/09/15  2:03 PM  Result Value Ref Range Status   Specimen Description BLOOD RIGHT AC  Final   Special Requests   Final    BOTTLES DRAWN AEROBIC AND ANAEROBIC AER 2ML ANA 0.5ML   Culture NO GROWTH 5 DAYS  Final   Report Status 12/14/2015 FINAL  Final  Urine culture     Status: None   Collection Time: 12/09/15  2:39 PM  Result Value Ref Range Status   Specimen Description URINE, RANDOM  Final   Special Requests NONE  Final   Culture NO GROWTH 1 DAY  Final   Report Status 12/11/2015 FINAL  Final  Rapid Influenza A&B Antigens ([place]  only)     Status: None   Collection Time: 12/09/15  3:34 PM  Result Value Ref Range Status   Influenza A [facility]) NOT DETECTED  Final   Influenza B ([facility]) NOT DETECTED  Final  RADIOLOGY:  No results found.  EKG:   Orders placed or performed during the hospital encounter of 12/09/15  . ED EKG  . ED EKG      Management plans discussed with the patient, family and they are in agreement.  CODE STATUS:     Code Status Orders        Start     Ordered   12/09/15 1706  Full code   Continuous     12/09/15 1705    Code Status History    Date Active Date Inactive Code Status Order ID Comments User Context   10/14/2015  1:42 PM 10/16/2015  4:29 PM Full Code [id number]  Nicholes Mango, MD Inpatient      TOTAL TIME TAKING CARE OF THIS PATIENT: 45 minutes.    @[facility] @  on 12/16/2015 at 12:56 PM  Between 7am to 6pm - Pager - [phone]  After 6pm go to [email or web address] - password [email or web address] [facility]  Office  [phone]  CC: Primary care physician; Lavonne Chick, MD

## 2015-12-16 NOTE — Progress Notes (Addendum)
Discharge instructions along with home medication list and follow up gone over with patient. Patient verbalized that he understood instructions. Printed rx given to patient. Patient to be discharged home, personal 02 tank at bedside, patient will be discharged on 3L. No distress noted, no c/o pain. Family at bedside to transport patient home. Iv removed x1 and telemetry

## 2015-12-19 LAB — MISC LABCORP TEST (SEND OUT): Labcorp test code: 9985

## 2015-12-19 LAB — ALPHA-1 ANTITRYPSIN PHENOTYPE: A-1 Antitrypsin, Ser: 158 mg/dL (ref 90–200)

## 2015-12-27 ENCOUNTER — Encounter: Payer: Self-pay | Admitting: Internal Medicine

## 2015-12-27 ENCOUNTER — Ambulatory Visit (INDEPENDENT_AMBULATORY_CARE_PROVIDER_SITE_OTHER): Payer: Managed Care, Other (non HMO) | Admitting: Internal Medicine

## 2015-12-27 VITALS — BP 138/68 | HR 108 | Ht 67.0 in | Wt 172.2 lb

## 2015-12-27 DIAGNOSIS — J441 Chronic obstructive pulmonary disease with (acute) exacerbation: Secondary | ICD-10-CM

## 2015-12-27 MED ORDER — UMECLIDINIUM-VILANTEROL 62.5-25 MCG/INH IN AEPB: 1.0000 | INHALATION_SPRAY | Freq: Every day | RESPIRATORY_TRACT | Status: AC

## 2015-12-27 MED ORDER — UMECLIDINIUM-VILANTEROL 62.5-25 MCG/INH IN AEPB: 1.0000 | INHALATION_SPRAY | Freq: Every day | RESPIRATORY_TRACT | Status: DC

## 2015-12-27 NOTE — Progress Notes (Signed)
* [facility]     Assessment and Plan:   60 year old male with bullous emphysema, interstitial lung disease, chronic respiratory failure.    Interstitial lung disease-possible pulmonary fibrosis. -CT chest from 12/12/15, in comparison with previous chest CT from 01/28/15 shows that the slight changes of interstitial lung disease seen last year have progressed tremendously. This, May represent advancing interstitial lung disease, there could be some overlying interstitial changes due to pulmonary edema.  -Will check CT-hi res.  --May be related to industrial exposures, works in [employer].  -Recommended that he not go back to work for the time being.    AECOPD with acute exacerbation--improved.  Severe bullous emphysema, significantly advanced on most recent CT chest over the last 8 months. - Maintain O2 saturations greater than 88% -Screening for alpha-1 antitrypsin deficiency. -He is only using advair once daily, will change to Anoro once daily.  -Complete PFT, 6MWT.  -Complete current prednisone taper and course of amox.    Acute hypoxic respiratory failure-chronic.  -Secondary to above --Will check 6MWT, PFT.   Nicotine abuse. -He quit smoking about 3 months ago, discussed importance of continued smoking cessation. -Alpha-1 test.   Follow up when ordered testing (PFT, 6MWT, CT hi-res) are done and alpha-1 results are available.   Date: 12/27/2015  MRN# [id number] Austin Patterson 1956/05/22   Austin Patterson is a 60 y.o. old male seen in follow up for chief complaint of  Chief Complaint  Patient presents with  . Hospitalization Follow-up    pt. states breathing is up & down since hosp. occ SOB, occ dry cough, occ wheezing, occ chest discomfort.     HPI:   The patient is a 60 year old male with a history of difficult for COPD, interstitial lung disease. He was recently seen in the inpatient setting for an acute exacerbation  of COPD, as well as acute pulmonary edema. It was noted that he had significant bullous emphysema as well as advancing interstitial changes seen on his most recent CT chest, in particular his emphysematous changes on the CT appeared to have advanced significantly since the previous CT, which was 8 months prior.  He was discharged on 3 L of oxygen via nasal cannula, he had previously been on 2 L of oxygen. Currently he feels that his breathing is back to about 75%, he is able to walk around [place] but will have to take his time and occasionally stop. He keeps his oxygen in his truck but usually does not take the oxygen with him.  He is has not smoked since Christmas of last year. He is currently using proair twice daily,  and advair once daily. He does not have a nebulizer.   He is currently on a prednisone taper and amox, he did not take it after he was discharged, was advised to start the abx and steroids upon followup with Dr. Tobin Chad.   He had smoked up until 2 months prior to the admission, an alpha-1 antitrypsin serum level was 158. 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  Review of most recent CT images in comparison with previous:  significant bullous emphysema as well as advancing interstitial changes seen on his most recent CT chest, in particular his emphysematous changes on the CT appeared to have advanced significantly since the previous CT, which was 8 months prior.    Medication:   Outpatient Encounter Prescriptions as of 12/27/2015  Medication Sig  . acetaminophen (TYLENOL) 325 MG tablet Take 2 tablets (650  mg total) by mouth every 6 (six) hours as needed for mild pain (or Fever >/= 101).  [name] albuterol (PROVENTIL HFA;VENTOLIN HFA) 108 (90 Base) MCG/ACT inhaler Inhale 2 puffs into the lungs every 4 (four) hours as needed for wheezing or shortness of breath.  [name] amLODipine (NORVASC) 10 MG tablet Take 10 mg by mouth daily.  [name] aspirin EC 81 MG tablet Take 81 mg by mouth daily.  [name] atorvastatin (LIPITOR) 40  MG tablet Take 40 mg by mouth at bedtime.  . clopidogrel (PLAVIX) 75 MG tablet Take 75 mg by mouth daily.  . Fluticasone-Salmeterol (ADVAIR DISKUS) 250-50 MCG/DOSE AEPB Inhale 1 puff into the lungs 2 (two) times daily.  [name] HYDROcodone-acetaminophen (NORCO/VICODIN) 5-325 MG tablet Take 1-2 tablets by mouth every 4 (four) hours as needed for moderate pain.  [name] loratadine (CLARITIN) 10 MG tablet Take 10 mg by mouth daily.  [name] losartan-hydrochlorothiazide (HYZAAR) 100-12.5 MG tablet Take 1 tablet by mouth daily.  . metoprolol succinate (TOPROL-XL) 50 MG 24 hr tablet Take 50 mg by mouth daily.  . nicotine (NICODERM CQ - DOSED IN MG/24 HOURS) 21 mg/24hr patch Place 1 patch (21 mg total) onto the skin daily.  . pantoprazole (PROTONIX) 40 MG tablet Take 40 mg by mouth daily.   No facility-administered encounter medications on file as of 12/27/2015.     Allergies:  Review of patient's allergies indicates no known allergies.  Review of Systems: Gen:  Denies  fever, sweats. HEENT: Denies blurred vision. Cvc:  No dizziness, chest pain or heaviness Resp:   Denies cough or sputum porduction. Gi: Denies swallowing difficulty, stomach pain. constipation Gu:  Denies bladder incontinence, burning urine Ext:   No Joint pain, stiffness. Skin: No skin rash, easy bruising. Endoc:  No polyuria, polydipsia. Psych: No depression, insomnia. Other:  All other systems were reviewed and found to be negative other than what is mentioned in the HPI.   Physical Examination:   VS: BP 138/68 mmHg  Pulse 108  Ht 5\' 7"  (1.702 m)  Wt 172 lb 3.2 oz (78.109 kg)  BMI 26.96 kg/m2  SpO2 90%  General Appearance: No distress  Neuro:without focal findings,   HEENT: PERRLA, EOM intact. Pulmonary: normal breath sounds, decreased breath sounds bilaterally.  CardiovascularNormal S1,S2.  No m/r/g.   Abdomen: Benign, Soft, non-tender. Renal:  No costovertebral tenderness  GU:  Not performed at this time. Endoc: No evident  thyromegaly,  Skin:   warm, no rash. Extremities: normal, no cyanosis, clubbing.   LABORATORY PANEL:   CBC No results for input(s): WBC, HGB, HCT, PLT in the last 168 hours. ------------------------------------------------------------------------------------------------------------------  Chemistries  No results for input(s): NA, K, CL, CO2, GLUCOSE, BUN, CREATININE, CALCIUM, MG, AST, ALT, ALKPHOS, BILITOT in the last 168 hours.  Invalid input(s): GFRCGP ------------------------------------------------------------------------------------------------------------------  Cardiac Enzymes No results for input(s): TROPONINI in the last 168 hours. ------------------------------------------------------------  RADIOLOGY:   No results found for this or any previous visit. Results for orders placed during the hospital encounter of 09/03/15  DG Chest 2 View   Narrative CLINICAL DATA:  Chest pain for 1 month.  EXAM: CHEST  2 VIEW  COMPARISON:  01/23/2015  FINDINGS: Lungs are hyperaerated and clear, other than scarring at the right lateral base. Normal heart size. No pneumothorax. Chronic right rib deformities. Stable thoracic spine.  IMPRESSION: No active cardiopulmonary disease.   Electronically Signed   By: Marybelle Killings M.D.   On: 09/03/2015 07:56    ------------------------------------------------------------------------------------------------------------------  Thank  you for allowing  [facility] to assist in the care of your patient. Our recommendations are noted above.  Please contact us if we can be of further service.   Marda Stalker, MD.  [facility]  Patricia Pesa, M.D.  Vilinda Boehringer, M.D.  Merton Border, M.D  12/27/2015

## 2015-12-27 NOTE — Patient Instructions (Addendum)
CT chest hi-res in about 1 month.  6MWT and PFT, Alpha-1 test at that time.    Stop advair, start anoro.   Stay away from cigarettes and smoke.

## 2015-12-30 ENCOUNTER — Ambulatory Visit: Payer: Managed Care, Other (non HMO) | Admitting: Family

## 2016-01-22 ENCOUNTER — Emergency Department: Payer: Managed Care, Other (non HMO)

## 2016-01-22 ENCOUNTER — Emergency Department
Admission: EM | Admit: 2016-01-22 | Discharge: 2016-01-22 | Disposition: A | Discharge status: Home / Self Care | Payer: Managed Care, Other (non HMO) | Attending: Emergency Medicine | Admitting: Emergency Medicine

## 2016-01-22 DIAGNOSIS — J441 Chronic obstructive pulmonary disease with (acute) exacerbation: Secondary | ICD-10-CM | POA: Insufficient documentation

## 2016-01-22 DIAGNOSIS — R0602 Shortness of breath: Secondary | ICD-10-CM | POA: Diagnosis present

## 2016-01-22 DIAGNOSIS — I10 Essential (primary) hypertension: Secondary | ICD-10-CM | POA: Insufficient documentation

## 2016-01-22 DIAGNOSIS — E785 Hyperlipidemia, unspecified: Secondary | ICD-10-CM | POA: Insufficient documentation

## 2016-01-22 DIAGNOSIS — Z87891 Personal history of nicotine dependence: Secondary | ICD-10-CM | POA: Insufficient documentation

## 2016-01-22 LAB — COMPREHENSIVE METABOLIC PANEL
ALBUMIN: 2.9 g/dL — AB (ref 3.5–5.0)
ALK PHOS: 100 U/L (ref 38–126)
ALT: 13 U/L — AB (ref 17–63)
AST: 23 U/L (ref 15–41)
Anion gap: 11 (ref 5–15)
BUN: 22 mg/dL — AB (ref 6–20)
CALCIUM: 7.4 mg/dL — AB (ref 8.9–10.3)
CO2: 24 mmol/L (ref 22–32)
CREATININE: 1.22 mg/dL (ref 0.61–1.24)
Chloride: 95 mmol/L — ABNORMAL LOW (ref 101–111)
GFR calc Af Amer: >60 mL/min (ref >60)
GLUCOSE: 135 mg/dL — AB (ref 65–99)
Potassium: 3 mmol/L — ABNORMAL LOW (ref 3.5–5.1)
Sodium: 130 mmol/L — ABNORMAL LOW (ref 135–145)
TOTAL PROTEIN: 6.4 g/dL — AB (ref 6.5–8.1)

## 2016-01-22 LAB — CBC
HEMATOCRIT: 33.1 % — AB (ref 40.0–52.0)
HEMOGLOBIN: 10.8 g/dL — AB (ref 13.0–18.0)
MCH: 28.1 pg (ref 26.0–34.0)
MCHC: 32.6 g/dL (ref 32.0–36.0)
MCV: 86.1 fL (ref 80.0–100.0)
Platelets: 809 10*3/uL — ABNORMAL HIGH (ref 150–440)
RBC: 3.85 MIL/uL — ABNORMAL LOW (ref 4.40–5.90)
RDW: 18.1 % — AB (ref 11.5–14.5)
WBC: 10.6 10*3/uL (ref 3.8–10.6)

## 2016-01-22 LAB — TROPONIN I

## 2016-01-22 MED ORDER — PREDNISONE 10 MG PO TABS: 10.0000 mg | ORAL_TABLET | Freq: Every day | ORAL | Status: DC

## 2016-01-22 MED ORDER — METHYLPREDNISOLONE SODIUM SUCC 125 MG IJ SOLR: 125.0000 mg | Freq: Once | INTRAMUSCULAR | Status: DC

## 2016-01-22 MED ORDER — AZITHROMYCIN 250 MG PO TABS: ORAL_TABLET | ORAL | Status: AC

## 2016-01-22 MED ORDER — AZITHROMYCIN 500 MG PO TABS
500.0000 mg | ORAL_TABLET | Freq: Once | ORAL | Status: AC
  Administered 2016-01-22: 500 mg via ORAL
  Filled 2016-01-22: qty 1

## 2016-01-22 MED ORDER — IPRATROPIUM-ALBUTEROL 0.5-2.5 (3) MG/3ML IN SOLN
3.0000 mL | Freq: Once | RESPIRATORY_TRACT | Status: AC
  Administered 2016-01-22: 3 mL via RESPIRATORY_TRACT
  Filled 2016-01-22: qty 3

## 2016-01-22 NOTE — Discharge Instructions (Signed)

## 2016-01-22 NOTE — ED Provider Notes (Signed)
[facility] Emergency Department Provider Note  Time seen: 8:00 PM  I have reviewed the triage vital signs and the nursing notes.   HISTORY  Chief Complaint Shortness of Breath    HPI Austin Patterson is a 60 y.o. male with a past medical history of hypertension, hyperlipidemia, COPD presents to the emergency department with shortness of breath. According to the patient he has COPD, wears 3 L of oxygen 24/7. States he was diagnosed with pneumonia in February requiring admission to the hospital. He states he was getting better at the end of February and into March however over the past 2 weeks he feels like he has worsened once again. Describes shortness of breath, worse with exertion. Denies cough or sputum production. Denies fever. Patient currently satting around 91-92 percent on 3 L which she states is fairly normal for him. Denies chest pain but states his chest does feel tight. Denies abdominal pain, nausea, vomiting, diarrhea, fever.     Past Medical History  Diagnosis Date  . Hypertension   . Hyperlipidemia   . COPD (chronic obstructive pulmonary disease) [facility])     Patient Active Problem List   Diagnosis Date Noted  . Pneumonia 12/09/2015  . COPD with acute exacerbation ([place]) 10/14/2015    Past Surgical History  Procedure Laterality Date  . Coronary stent placement    . Shoulder acromioplasty      Current Outpatient Rx  Name  Route  Sig  Dispense  Refill  . acetaminophen (TYLENOL) 325 MG tablet   Oral   Take 2 tablets (650 mg total) by mouth every 6 (six) hours as needed for mild pain (or Fever >/= 101).         [name] albuterol (PROVENTIL HFA;VENTOLIN HFA) 108 (90 Base) MCG/ACT inhaler   Inhalation   Inhale 2 puffs into the lungs every 4 (four) hours as needed for wheezing or shortness of breath.   1 Inhaler   2   . amLODipine (NORVASC) 10 MG tablet   Oral   Take 10 mg by mouth daily.         [name] aspirin EC 81 MG tablet   Oral  Take 81 mg by mouth daily.         [name] atorvastatin (LIPITOR) 40 MG tablet   Oral   Take 40 mg by mouth at bedtime.         . clopidogrel (PLAVIX) 75 MG tablet   Oral   Take 75 mg by mouth daily.         . Fluticasone-Salmeterol (ADVAIR DISKUS) 250-50 MCG/DOSE AEPB   Inhalation   Inhale 1 puff into the lungs 2 (two) times daily.   60 each   0   . HYDROcodone-acetaminophen (NORCO/VICODIN) 5-325 MG tablet   Oral   Take 1-2 tablets by mouth every 4 (four) hours as needed for moderate pain.   20 tablet   0   . loratadine (CLARITIN) 10 MG tablet   Oral   Take 10 mg by mouth daily.         [name] losartan-hydrochlorothiazide (HYZAAR) 100-12.5 MG tablet   Oral   Take 1 tablet by mouth daily.         . metoprolol succinate (TOPROL-XL) 50 MG 24 hr tablet   Oral   Take 50 mg by mouth daily.         . nicotine (NICODERM CQ - DOSED IN MG/24 HOURS) 21 mg/24hr patch   Transdermal   Place  1 patch (21 mg total) onto the skin daily.   28 patch   0   . pantoprazole (PROTONIX) 40 MG tablet   Oral   Take 40 mg by mouth daily.         . predniSONE (DELTASONE) 10 MG tablet               . umeclidinium-vilanterol (ANORO ELLIPTA) 62.5-25 MCG/INH AEPB   Inhalation   Inhale 1 puff into the lungs daily.   60 each   2     Allergies Review of patient's allergies indicates no known allergies.  Family History  Problem Relation Age of Onset  . Heart disease Mother     Social History Social History  Substance Use Topics  . Smoking status: Former Smoker    Quit date: 10/14/2015  . Smokeless tobacco: None  . Alcohol Use: 16.8 oz/week    28 Cans of beer per week     Comment: occasional     Review of Systems Constitutional: Negative for fever. Cardiovascular: Negative for chest pain, positive for mild chest tightness. Respiratory: Positive for shortness of breath, worse over the past several weeks. Gastrointestinal: Negative for abdominal pain, vomiting and  diarrhea. Genitourinary: Negative for dysuria. Musculoskeletal: Negative for back pain. Neurological: Negative for headache 10-point ROS otherwise negative.  ____________________________________________   PHYSICAL EXAM:  VITAL SIGNS: ED Triage Vitals  Enc Vitals Group     BP 01/22/16 1852 120/70 mmHg     Pulse Rate 01/22/16 1852 100     Resp 01/22/16 1852 18     Temp 01/22/16 1852 98.6 F (37 C)     Temp Source 01/22/16 1852 Oral     SpO2 01/22/16 1852 91 %     Weight 01/22/16 1852 165 lb (74.844 kg)     Height 01/22/16 1852 5\' 7"  (1.702 m)     Head Cir --      Peak Flow --      Pain Score 01/22/16 1853 5     Pain Loc --      Pain Edu? --      Excl. in [place]? --     Constitutional: Alert and oriented. Well appearing and in no distress. Eyes: Normal exam ENT   Head: Normocephalic and atraumatic.   Mouth/Throat: Mucous membranes are moist. Cardiovascular: Normal rate, regular rhythm. No murmur Respiratory: Normal respiratory effort without tachypnea nor retractions. Decreased breath sounds bilaterally, no wheeze heard. Gastrointestinal: Soft and nontender. No distention.   Musculoskeletal: Nontender with normal range of motion in all extremities. No lower extremity tenderness or edema. Neurologic:  Normal speech and language. No gross focal neurologic deficits Skin:  Skin is warm, dry and intact.  Psychiatric: Mood and affect are normal. Speech and behavior are normal.   ____________________________________________    EKG  Reviewed and interpreted by myself shows normal sinus rhythm at 99 bpm, narrow QRS, normal axis, normal intervals, no ST changes. Overall non-concerning EKG.  ____________________________________________    RADIOLOGY  X-ray shows resolution of prior opacity, progression of interstitial disease/severe emphysema.  ____________________________________________   INITIAL IMPRESSION / ASSESSMENT AND PLAN / ED COURSE  Pertinent labs &  imaging results that were available during my care of the patient were reviewed by me and considered in my medical decision making (see chart for details).  Patient presents the emergency department with difficulty breathing. Chest x-ray shows no pneumonia, labs are currently pending. Patient's exam is consistent COPD exacerbation. We will treat with DuoNeb's, dose Solu-Medrol,  continue to monitor in the emergency department.  Labs are within normal limits. Troponin negative. X-ray consistent with COPD. Patient doing much better after breathing treatment currently satting 96%. We will discharge the patient on steroids as well as a Z-Pak to cover for COPD exacerbation. Patient is agreeable to plan. I discussed strict return precautions, patient agreeable.  ____________________________________________   FINAL CLINICAL IMPRESSION(S) / ED DIAGNOSES  Dyspnea COPD exacerbation   Harvest Dark, MD 01/22/16 2119

## 2016-01-22 NOTE — ED Notes (Signed)
Patient with hx of COPD. Was hospitalized in February with pneumonia. States today had worsening SOB.

## 2016-01-29 ENCOUNTER — Ambulatory Visit (INDEPENDENT_AMBULATORY_CARE_PROVIDER_SITE_OTHER): Payer: Managed Care, Other (non HMO) | Admitting: *Deleted

## 2016-01-29 ENCOUNTER — Ambulatory Visit
Admission: RE | Admit: 2016-01-29 | Discharge: 2016-01-29 | Disposition: A | Discharge status: Home / Self Care | Payer: Managed Care, Other (non HMO) | Source: Ambulatory Visit | Attending: Internal Medicine | Admitting: Internal Medicine

## 2016-01-29 DIAGNOSIS — D3502 Benign neoplasm of left adrenal gland: Secondary | ICD-10-CM | POA: Diagnosis not present

## 2016-01-29 DIAGNOSIS — R079 Chest pain, unspecified: Secondary | ICD-10-CM | POA: Diagnosis not present

## 2016-01-29 DIAGNOSIS — I700 Atherosclerosis of aorta: Secondary | ICD-10-CM | POA: Insufficient documentation

## 2016-01-29 DIAGNOSIS — J8410 Pulmonary fibrosis, unspecified: Secondary | ICD-10-CM | POA: Diagnosis not present

## 2016-01-29 DIAGNOSIS — J479 Bronchiectasis, uncomplicated: Secondary | ICD-10-CM | POA: Diagnosis not present

## 2016-01-29 DIAGNOSIS — D3501 Benign neoplasm of right adrenal gland: Secondary | ICD-10-CM | POA: Insufficient documentation

## 2016-01-29 DIAGNOSIS — K449 Diaphragmatic hernia without obstruction or gangrene: Secondary | ICD-10-CM | POA: Diagnosis not present

## 2016-01-29 DIAGNOSIS — J441 Chronic obstructive pulmonary disease with (acute) exacerbation: Secondary | ICD-10-CM | POA: Diagnosis not present

## 2016-01-29 DIAGNOSIS — J439 Emphysema, unspecified: Secondary | ICD-10-CM | POA: Diagnosis not present

## 2016-01-29 DIAGNOSIS — J449 Chronic obstructive pulmonary disease, unspecified: Secondary | ICD-10-CM

## 2016-01-29 LAB — PULMONARY FUNCTION TEST
DL/VA % pred: 35 %
DL/VA: 1.57 ml/min/mmHg/L
DLCO UNC % PRED: 26 %
DLCO unc: 7.42 ml/min/mmHg
FEF 25-75 PRE: 0.83 L/s
FEF 25-75 Post: 0.82 L/sec
FEF2575-%Change-Post: -1 %
FEF2575-%Pred-Post: 30 %
FEF2575-%Pred-Pre: 31 %
FEV1-%Change-Post: 0 %
FEV1-%Pred-Post: 52 %
FEV1-%Pred-Pre: 52 %
FEV1-Post: 1.7 L
FEV1-Pre: 1.69 L
FEV1FVC-%CHANGE-POST: 0 %
FEV1FVC-%PRED-PRE: 85 %
FEV6-%CHANGE-POST: 0 %
FEV6-%Pred-Post: 64 %
FEV6-%Pred-Pre: 64 %
FEV6-Post: 2.6 L
FEV6-Pre: 2.61 L
FEV6FVC-%CHANGE-POST: 0 %
FEV6FVC-%PRED-PRE: 105 %
FEV6FVC-%Pred-Post: 104 %
FVC-%CHANGE-POST: 0 %
FVC-%PRED-POST: 61 %
FVC-%Pred-Pre: 61 %
FVC-Post: 2.62 L
FVC-Pre: 2.61 L
POST FEV1/FVC RATIO: 65 %
POST FEV6/FVC RATIO: 99 %
PRE FEV1/FVC RATIO: 65 %
Pre FEV6/FVC Ratio: 100 %

## 2016-01-29 NOTE — Progress Notes (Signed)
SMW performed today. Pt unable to finish due to O2 level dropping.

## 2016-01-29 NOTE — Progress Notes (Signed)
PFT performed today with Nitrogen washout. 

## 2016-02-07 ENCOUNTER — Ambulatory Visit (INDEPENDENT_AMBULATORY_CARE_PROVIDER_SITE_OTHER): Payer: Managed Care, Other (non HMO) | Admitting: Internal Medicine

## 2016-02-07 VITALS — BP 128/74 | HR 101

## 2016-02-07 DIAGNOSIS — J449 Chronic obstructive pulmonary disease, unspecified: Secondary | ICD-10-CM

## 2016-02-07 MED ORDER — PREDNISONE 10 MG PO TABS: ORAL_TABLET | ORAL | Status: DC

## 2016-02-07 NOTE — Progress Notes (Signed)
* Austin Patterson     Assessment and Plan:   60 year old male with bullous emphysema, interstitial lung disease, chronic respiratory failure.    Interstitial lung disease-possible pulmonary fibrosis. - advancing interstitial lung disease, discussed the diagnosis of pulmonary fibrosis, as well as its progressive nature. --He works in [employer]. We'll recommend that he not go back to work as continued exposures  may exacerbate his condition. -Recommended that he not go back to work for the time being.  -The patient will be started on prednisone at 50 mg daily empirically until his next visit. -We discussed the course of pulmonary fibrosis, the patient will be started on Esbriet (pirfenidone), prescription will be sent and we will start the preauthorization process.   AECOPD with acute exacerbation--improved.  Severe bullous emphysema, significantly advanced on most recent CT chest over the last 8 months. - Maintain O2 saturations greater than 88% - Anoro once daily.   Alpha-1 antitrypsin deficiency. -The patient's phenotype is MZ, he has a history of smoking. He is a severe emphysema appears to be out of proportion to his history of smoking, therefore, we will have to consider that the patient's severe findings may be at least in part due to his alpha-1 antitrypsin deficiency. -We'll plan to start the patient on an alpha-1 antitrypsin replacement.   Acute hypoxic respiratory failure-chronic.  -Secondary to above, currently dependent on 3 L of oxygen. There   Nicotine abuse. -He quit smoking about 3 months ago, discussed importance of continued smoking cessation.   Date: 02/07/2016  MRN# [id number] [name] December 13, 1955   Austin Patterson [place] is a 60 y.o. old male seen in follow up for chief complaint of  Chief Complaint  Patient presents with  . Follow-up    PFT results. pt states breathing is baseline. c/o DOE, prod cough clear in  color due to allergies, wheezing & cp     HPI:   The patient is a 60 year old male with a history of difficult for COPD, interstitial lung disease. His breathing problems about 1 year ago, he has been on Anoro, he can not really tell a difference. He continues to have a cough, he is not taking anything for it.   He was discharged on 3 L of oxygen via nasal cannula, he had previously been on 2 L of oxygen.  He went back to the hospital and is on prednisone at 10 mg for 20 days.   He had smoked up until 2 months prior to the admission, an alpha-1 antitrypsin serum level was 158. 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  Pulmonary function testing 01/29/16: Spirometry FEV1 was 1.69 L which is 52% of predicted, there was no significant improvement with bronchodilator therapy. The FVC was 61% of predicted, the FEV to FVC ratio was 65%. Lung volumes: Residual volume was 60% of predicted, TLC was 59% of predicted, RV/TLC ratio was 112% of predicted. Diffusion capacity: 26% of predicted. Interpretation. 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  Review of most recent CT images in comparison with previous:   Review of the CT chest images from 01/29/16 showed continued severe interstitial changes throughout both lungs, worst in the upper lobes. The previously seen pneumonic changes from 12/11/15 in the left base and to a lesser degree in the right base have improved. significant bullous emphysema as well as advancing interstitial changes seen onCT  chest 12/11/15, in particular his emphysematous changes on the CT appeared to have advanced significantly since the previous CT, which was 8 months prior.    Medication:   Outpatient Encounter Prescriptions as of 02/07/2016  Medication Sig  . acetaminophen (TYLENOL) 325 MG tablet Take 2 tablets (650 mg total) by mouth  every 6 (six) hours as needed for mild pain (or Fever >/= 101).  [name] albuterol (PROVENTIL HFA;VENTOLIN HFA) 108 (90 Base) MCG/ACT inhaler Inhale 2 puffs into the lungs every 4 (four) hours as needed for wheezing or shortness of breath.  [name] amLODipine (NORVASC) 10 MG tablet Take 10 mg by mouth daily.  [name] aspirin EC 81 MG tablet Take 81 mg by mouth daily.  [name] atorvastatin (LIPITOR) 40 MG tablet Take 40 mg by mouth at bedtime.  . clopidogrel (PLAVIX) 75 MG tablet Take 75 mg by mouth daily.  . Fluticasone-Salmeterol (ADVAIR DISKUS) 250-50 MCG/DOSE AEPB Inhale 1 puff into the lungs 2 (two) times daily.  [name] HYDROcodone-acetaminophen (NORCO/VICODIN) 5-325 MG tablet Take 1-2 tablets by mouth every 4 (four) hours as needed for moderate pain.  [name] loratadine (CLARITIN) 10 MG tablet Take 10 mg by mouth daily.  [name] losartan-hydrochlorothiazide (HYZAAR) 100-12.5 MG tablet Take 1 tablet by mouth daily.  . metoprolol succinate (TOPROL-XL) 50 MG 24 hr tablet Take 50 mg by mouth daily.  . nicotine (NICODERM CQ - DOSED IN MG/24 HOURS) 21 mg/24hr patch Place 1 patch (21 mg total) onto the skin daily.  . pantoprazole (PROTONIX) 40 MG tablet Take 40 mg by mouth daily.  . predniSONE (DELTASONE) 10 MG tablet Take 1 tablet (10 mg total) by mouth daily.  [name] umeclidinium-vilanterol (ANORO ELLIPTA) 62.5-25 MCG/INH AEPB Inhale 1 puff into the lungs daily.   No facility-administered encounter medications on file as of 02/07/2016.     Allergies:  Review of patient's allergies indicates no known allergies.  Review of Systems: Gen:  Denies  fever, sweats. HEENT: Denies blurred vision. Cvc:  No dizziness, chest pain or heaviness Resp:   Denies cough or sputum porduction. Gi: Denies swallowing difficulty, stomach pain. constipation Gu:  Denies bladder incontinence, burning urine Ext:   No Joint pain, stiffness. Skin: No skin rash, easy bruising. Endoc:  No polyuria, polydipsia. Psych: No depression, insomnia. Other:  All other  systems were reviewed and found to be negative other than what is mentioned in the HPI.   Physical Examination:   VS: BP 128/74 mmHg  Pulse 101  SpO2 90%  General Appearance: No distress  Neuro:without focal findings,   HEENT: PERRLA, EOM intact. Pulmonary: normal breath sounds, decreased breath sounds bilaterally.  CardiovascularNormal S1,S2.  No m/r/g.   Abdomen: Benign, Soft, non-tender. Renal:  No costovertebral tenderness  GU:  Not performed at this time. Endoc: No evident thyromegaly,  Skin:   warm, no rash. Extremities: normal, no cyanosis, clubbing.   LABORATORY PANEL:   CBC No results for input(s): WBC, HGB, HCT, PLT in the last 168 hours. ------------------------------------------------------------------------------------------------------------------  Chemistries  No results for input(s): NA, K, CL, CO2, GLUCOSE, BUN, CREATININE, CALCIUM, MG, AST, ALT, ALKPHOS, BILITOT in the last 168 hours.  Invalid input(s): GFRCGP ------------------------------------------------------------------------------------------------------------------  Cardiac Enzymes No results for input(s): TROPONINI in the last 168 hours. ------------------------------------------------------------  RADIOLOGY:   No results found for this or any previous visit. Results for orders placed during the hospital encounter of 09/03/15  DG Chest 2 View   Narrative CLINICAL DATA:  Chest pain for 1 month.  EXAM: CHEST  2 VIEW  COMPARISON:  01/23/2015  FINDINGS: Lungs are hyperaerated and clear, other than scarring at the right lateral base. Normal heart size. No pneumothorax. Chronic right rib deformities. Stable thoracic spine.  IMPRESSION: No active cardiopulmonary disease.   Electronically Signed   By: Marybelle Killings M.D.   On: 09/03/2015 07:56    ------------------------------------------------------------------------------------------------------------------  Thank  you for allowing  [facility] to assist in the care of your patient. Our recommendations are noted above.  Please contact us if we can be of further service.   Marda Stalker, MD.  [facility]  Patricia Pesa, M.D.  Vilinda Boehringer, M.D.  Merton Border, M.D  02/07/2016

## 2016-02-07 NOTE — Addendum Note (Signed)
Addended by: Maryanna Shape A on: 02/07/2016 10:59 AM   Modules accepted: Orders

## 2016-02-07 NOTE — Patient Instructions (Signed)
You have Idiopathic pulmonary fibrosis and alpha-1 antitrypsin deficiency.  --You should inform your children that they need to be tested for alpha-1 antitrypsin deficiency, the same type of test that you had.   --Will need to start patient on both alpha-1 antitrypsin replacement (Zemaira) as well as Esbriet.  --Increase prednisone to 50 mg daily at least until next visit.   --Follow up in one month.

## 2016-02-07 NOTE — Addendum Note (Signed)
Addended by: Maryanna Shape A on: 02/07/2016 11:02 AM   Modules accepted: Orders

## 2016-02-13 ENCOUNTER — Telehealth: Payer: Self-pay | Admitting: *Deleted

## 2016-02-13 NOTE — Telephone Encounter (Signed)
Spoke with the rep for [facility] and she states pt will have no copay due to meeting his deductible and out of pocket. She states she will be faxing [place] a form in case we need to do anything on the PA. Pt has been approved for the 1st month free. She states that they will wither setup the infusion in the pt's home or either an outside facility. States they have spoken with the pt in regards to this. FYI for you.

## 2016-02-18 ENCOUNTER — Telehealth: Payer: Self-pay | Admitting: Internal Medicine

## 2016-02-18 NOTE — Telephone Encounter (Signed)
Please call regarding Benefits investigation.

## 2016-02-19 NOTE — Telephone Encounter (Signed)
Called to verify fax number and to confirm that I received their paperwork. Nothing further needed.

## 2016-02-24 ENCOUNTER — Telehealth: Payer: Self-pay | Admitting: *Deleted

## 2016-02-24 NOTE — Telephone Encounter (Signed)
Received form and filled it out and faxed it to [phone]. Will await response.

## 2016-02-24 NOTE — Telephone Encounter (Signed)
Called to initiate PA for Esbriet over the phone for [place] at [phone]. They state I must fax in the request and that it can't be done over the phone. Will await for fax so it can be filled out and faxed back in.

## 2016-02-26 NOTE — Telephone Encounter (Signed)
[employer] and they state it is still pending. Will await response.

## 2016-02-28 ENCOUNTER — Telehealth: Payer: Self-pay | Admitting: Internal Medicine

## 2016-02-28 NOTE — Telephone Encounter (Signed)
Representative shara calling from [facility] following up on PA on zemaira   States if we can call back with info needed it would help with PA getting approved.  1.Missing information was just the dose on the prescription 2.Also wanted to know how often he will need this  3.And how long will patient be on this  Reference number is [id number]  Please call back

## 2016-03-02 ENCOUNTER — Telehealth: Payer: Self-pay | Admitting: *Deleted

## 2016-03-02 NOTE — Telephone Encounter (Signed)
Pt called stating he received his Esbriet. He asked if he should stop his prednisone. Per DK, pt is to start taking Prednisone 20mg  daily and start Esbriet and come in next week to see DFR. appt scheduled. Nothing further needed.

## 2016-03-02 NOTE — Telephone Encounter (Signed)
Tried calling but has to LM.

## 2016-03-02 NOTE — Telephone Encounter (Signed)
Pt called today stating he had received the Esbriet. Nothing further needed.

## 2016-03-03 NOTE — Telephone Encounter (Signed)
Spoke with Austin Patterson and they state the Zemaira had been denied because pt's AAT is NOT less than 80.

## 2016-03-09 ENCOUNTER — Ambulatory Visit (INDEPENDENT_AMBULATORY_CARE_PROVIDER_SITE_OTHER): Payer: Managed Care, Other (non HMO) | Admitting: Internal Medicine

## 2016-03-09 ENCOUNTER — Encounter: Payer: Self-pay | Admitting: Internal Medicine

## 2016-03-09 ENCOUNTER — Other Ambulatory Visit
Admission: RE | Admit: 2016-03-09 | Discharge: 2016-03-09 | Disposition: A | Discharge status: Home / Self Care | Payer: Managed Care, Other (non HMO) | Source: Ambulatory Visit | Attending: Internal Medicine | Admitting: Internal Medicine

## 2016-03-09 VITALS — BP 142/78 | HR 101 | Ht 67.0 in | Wt 174.4 lb

## 2016-03-09 DIAGNOSIS — J8410 Pulmonary fibrosis, unspecified: Secondary | ICD-10-CM | POA: Diagnosis not present

## 2016-03-09 DIAGNOSIS — J449 Chronic obstructive pulmonary disease, unspecified: Secondary | ICD-10-CM

## 2016-03-09 DIAGNOSIS — R0600 Dyspnea, unspecified: Secondary | ICD-10-CM | POA: Diagnosis not present

## 2016-03-09 LAB — BASIC METABOLIC PANEL
ANION GAP: 11 (ref 5–15)
BUN: 23 mg/dL — ABNORMAL HIGH (ref 6–20)
CO2: 23 mmol/L (ref 22–32)
Calcium: 9.1 mg/dL (ref 8.9–10.3)
Chloride: 99 mmol/L — ABNORMAL LOW (ref 101–111)
Creatinine, Ser: 1.12 mg/dL (ref 0.61–1.24)
Glucose, Bld: 151 mg/dL — ABNORMAL HIGH (ref 65–99)
POTASSIUM: 4 mmol/L (ref 3.5–5.1)
SODIUM: 133 mmol/L — AB (ref 135–145)

## 2016-03-09 LAB — CBC
HEMATOCRIT: 32.8 % — AB (ref 40.0–52.0)
HEMOGLOBIN: 10.5 g/dL — AB (ref 13.0–18.0)
MCH: 25.9 pg — AB (ref 26.0–34.0)
MCHC: 32.1 g/dL (ref 32.0–36.0)
MCV: 80.8 fL (ref 80.0–100.0)
Platelets: 426 10*3/uL (ref 150–440)
RBC: 4.06 MIL/uL — ABNORMAL LOW (ref 4.40–5.90)
RDW: 19.4 % — ABNORMAL HIGH (ref 11.5–14.5)
WBC: 11.1 10*3/uL — ABNORMAL HIGH (ref 3.8–10.6)

## 2016-03-09 LAB — HEPATIC FUNCTION PANEL
ALT: 37 U/L (ref 17–63)
AST: 33 U/L (ref 15–41)
Albumin: 4.4 g/dL (ref 3.5–5.0)
Alkaline Phosphatase: 46 U/L (ref 38–126)
Bilirubin, Direct: <0.1 mg/dL — ABNORMAL LOW (ref 0.1–0.5)
TOTAL PROTEIN: 7.5 g/dL (ref 6.5–8.1)
Total Bilirubin: 0.8 mg/dL (ref 0.3–1.2)

## 2016-03-09 NOTE — Patient Instructions (Addendum)
Check CBC, basic metabolic panel, and liver function panel.   Cut down prednisone for 40 mg for 1 week, then 30 for 1 week, then 20 mg for 1 week, then 10 mg for 1 week, then half tab for 1 week, then stop.   If you start getting worse, go back to previous dose.

## 2016-03-09 NOTE — Addendum Note (Signed)
Addended by: Maryanna Shape A on: 03/09/2016 11:55 AM   Modules accepted: Orders

## 2016-03-09 NOTE — Progress Notes (Addendum)
* Austin Patterson     Assessment and Plan:   60 year old male with bullous emphysema, interstitial lung disease, chronic respiratory failure.    Interstitial lung disease-idiopathic pulmonary fibrosis. - advancing interstitial lung disease, discussed the diagnosis of pulmonary fibrosis, as well as its progressive nature. --He works in [employer]. We'll recommend that he not go back to work as continued exposures  may exacerbate his condition. -Recommended that he not go back to work for the time being.  -The patient was started empirically on prednisone therapy. However, he has not responded-and further confirms the diagnosis of idiopathic pulmonary fibrosis, as hypersensitivity pneumonitis would respond to steroid therapy. Cut down prednisone by 10 mg weekly, then finally to 5 mg daily for 1 week, then stop.  --Start esbriet as prescribed.  --Will check blood work.    AECOPD with acute exacerbation--improved.  Severe bullous emphysema, significantly advanced on most recent CT chest over the last 8 months. - Maintain O2 saturations greater than 88% - Anoro once daily.   Alpha-1 antitrypsin deficiency. -Workup including laboratory evaluation, serology was negative other than alpha-1 antitrypsin deficiency. The patient's phenotype is MZ, alpha-1 antitrypsin replacement therapy was not approved by insurance.   Acute hypoxic respiratory failure-chronic.  -Secondary to above, currently dependent on 4 L of oxygen. There   Nicotine abuse. -He quit smoking about 3 months ago, discussed importance of continued smoking cessation.   Date: 03/09/2016  MRN# [id number] Austin Patterson May 21, 1956   Austin Patterson is a 60 y.o. old male seen in follow up for chief complaint of  Chief Complaint  Patient presents with  . Follow-up    pt states breathing is baseline. c/o occ SOB, prod cough clear in color, wheezing.      HPI:   The patient is a  60 year old male with a history of difficult for COPD, interstitial lung disease. His breathing problems about 1 year ago, he has been on Anoro, he can not really tell a difference. He continues to have a cough, he is not taking anything for it.   He was discharged on 3 L of oxygen via nasal cannula, he had previously been on 2 L of oxygen.  He went back to the hospital and is on prednisone at 10 mg for 20 days.   He had smoked up until 2 months prior to the admission, an alpha-1 antitrypsin serum level was 158. 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  Pulmonary function testing 01/29/16: Spirometry FEV1 was 1.69 L which is 52% of predicted, there was no significant improvement with bronchodilator therapy. The FVC was 61% of predicted, the FEV to FVC ratio was 65%. Lung volumes: Residual volume was 60% of predicted, TLC was 59% of predicted, RV/TLC ratio was 112% of predicted. Diffusion capacity: 26% of predicted. Interpretation. 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  Review of most recent CT images in comparison with previous:   Review of the CT chest images from 01/29/16 showed continued severe interstitial changes throughout both lungs, there is severe honeycombing and scarring in the bases with traction bronchiectasis.there is emphysema worst in the upper lobes. The previously seen pneumonic changes from 12/11/15 in the left base and to a lesser degree in the right base have improved. significant bullous emphysema as well as advancing interstitial changes seen onCT chest 12/11/15, in particular his emphysematous changes on the CT  appeared to have advanced significantly since the previous CT, which was 8 months prior. -On most recent CT per radiology report, there is evidence of ground glass changes, however, I see no evidence of ground glass changes on  the CT of the chest. These changes on the CT of the chest appear most consistent with idiopathic only fibrosis-UIP pattern.    Medication:   Outpatient Encounter Prescriptions as of 03/09/2016  Medication Sig  . acetaminophen (TYLENOL) 325 MG tablet Take 2 tablets (650 mg total) by mouth every 6 (six) hours as needed for mild pain (or Fever >/= 101).  [name] albuterol (PROVENTIL HFA;VENTOLIN HFA) 108 (90 Base) MCG/ACT inhaler Inhale 2 puffs into the lungs every 4 (four) hours as needed for wheezing or shortness of breath.  [name] amLODipine (NORVASC) 10 MG tablet Take 10 mg by mouth daily.  [name] aspirin EC 81 MG tablet Take 81 mg by mouth daily.  [name] atorvastatin (LIPITOR) 40 MG tablet Take 40 mg by mouth at bedtime.  . clopidogrel (PLAVIX) 75 MG tablet Take 75 mg by mouth daily.  [name] HYDROcodone-acetaminophen (NORCO/VICODIN) 5-325 MG tablet Take 1-2 tablets by mouth every 4 (four) hours as needed for moderate pain.  [name] loratadine (CLARITIN) 10 MG tablet Take 10 mg by mouth daily.  [name] losartan-hydrochlorothiazide (HYZAAR) 100-12.5 MG tablet Take 1 tablet by mouth daily.  . metoprolol succinate (TOPROL-XL) 50 MG 24 hr tablet Take 50 mg by mouth daily.  . pantoprazole (PROTONIX) 40 MG tablet Take 40 mg by mouth daily.  . predniSONE (DELTASONE) 10 MG tablet Take 1 tablet (10 mg total) by mouth daily.  . predniSONE (DELTASONE) 10 MG tablet 5tabs daily  . umeclidinium-vilanterol (ANORO ELLIPTA) 62.5-25 MCG/INH AEPB Inhale 1 puff into the lungs daily.   No facility-administered encounter medications on file as of 03/09/2016.     Allergies:  Review of patient's allergies indicates no known allergies.  Review of Systems: Gen:  Denies  fever, sweats. HEENT: Denies blurred vision. Cvc:  No dizziness, chest pain or heaviness Resp:   Denies cough or sputum porduction. Gi: Denies swallowing difficulty, stomach pain. constipation Gu:  Denies bladder incontinence, burning urine Ext:   No Joint pain, stiffness. Skin:  No skin rash, easy bruising. Endoc:  No polyuria, polydipsia. Psych: No depression, insomnia. Other:  All other systems were reviewed and found to be negative other than what is mentioned in the HPI.   Physical Examination:   VS: BP 142/78 mmHg  Pulse 101  Ht 5\' 7"  (1.702 m)  Wt 174 lb 6.4 oz (79.107 kg)  BMI 27.31 kg/m2  SpO2 98%  General Appearance: No distress  Neuro:without focal findings,   HEENT: PERRLA, EOM intact. Pulmonary: normal breath sounds, decreased breath sounds bilaterally.  CardiovascularNormal S1,S2.  No m/r/g.   Abdomen: Benign, Soft, non-tender. Renal:  No costovertebral tenderness  GU:  Not performed at this time. Endoc: No evident thyromegaly,  Skin:   warm, no rash. Extremities: normal, no cyanosis, clubbing.   LABORATORY PANEL:   CBC No results for input(s): WBC, HGB, HCT, PLT in the last 168 hours. ------------------------------------------------------------------------------------------------------------------  Chemistries  No results for input(s): NA, K, CL, CO2, GLUCOSE, BUN, CREATININE, CALCIUM, MG, AST, ALT, ALKPHOS, BILITOT in the last 168 hours.  Invalid input(s): GFRCGP ------------------------------------------------------------------------------------------------------------------  Cardiac Enzymes No results for input(s): TROPONINI in the last 168 hours. ------------------------------------------------------------  RADIOLOGY:   No results found for this or any previous visit. Results for orders placed during the hospital encounter of 09/03/15  DG Chest 2 View   Narrative CLINICAL DATA:  Chest pain for 1 month.  EXAM: CHEST  2 VIEW  COMPARISON:  01/23/2015  FINDINGS: Lungs are hyperaerated and clear, other than scarring at the right lateral base. Normal heart size. No pneumothorax. Chronic right rib deformities. Stable thoracic spine.  IMPRESSION: No active cardiopulmonary disease.   Electronically Signed   By: Marybelle Killings M.D.   On: 09/03/2015 07:56    ------------------------------------------------------------------------------------------------------------------  Thank  you for allowing [facility] to assist in the care of your patient. Our recommendations are noted above.  Please contact us if we can be of further service.   Marda Stalker, MD.  [facility]  Patricia Pesa, M.D.  Vilinda Boehringer, M.D.  Merton Border, M.D  03/09/2016

## 2016-03-13 NOTE — Telephone Encounter (Signed)
Please advise if you want me to resubmit for Zemaira. Insurance previously stated it would be covered due to the AAT not being less than 80. Thanks.

## 2016-03-13 NOTE — Telephone Encounter (Signed)
Per Hortencia Pilar rep original PA was sent under pharmacy benefits.  Please re submit under medical plan benefits and resubmit.    Austin Patterson with [name] will fax  This form over att misty to [phone].  Please call to touch base.

## 2016-03-17 NOTE — Telephone Encounter (Signed)
yes

## 2016-03-18 ENCOUNTER — Telehealth: Payer: Self-pay | Admitting: Internal Medicine

## 2016-03-18 NOTE — Telephone Encounter (Addendum)
Case manager Donetta Potts calling to let us know about a PA for patient  PA was denied for patient on Zemaira  Pt wanted to do an appreal himself but was told that we sent it under Pharmacy benefit  Was told that if we could resend it in but under Medical it would go through. They sent a fax over 03/11/16 to [place] about this. They will send in another fax on this but wanted to follow up on this Pt had mentioned to them last time they spoke that he had an appointment coming up with [place], so she is also wanting to make sure that we didn't change the medication.

## 2016-03-18 NOTE — Telephone Encounter (Signed)
Will submit to medical portion of insurance. See other phone note.

## 2016-03-18 NOTE — Telephone Encounter (Signed)
[place] to get form to resubmit PA for Zemaira. Per DR we will resubmit a new PA. Marcelina Morel will fax or email forms. Will await forms.

## 2016-03-18 NOTE — Telephone Encounter (Signed)
See below

## 2016-03-19 NOTE — Telephone Encounter (Signed)
After I spoke with Marcelina Morel from [facility] yesterday she emailed me back stating the following. Per her email we can not proceed with the [facility] for the pt.   Hi Jenae Tomasello, I wanted to double check with the plan again before sending the PA form over to you. I called them today I was provided some different information on the PA compared to what I was told by the plan on 5/19. I confirmed with a representative the PA had denied cause the patient A1AT level was above 80mg /dL and this time I was advised the PA was processed under the Medical benefits, denial letter will be sent shortly. The representative explained either way the patient will still have to meet the plan criteria (A1AT level below 80mg /dL) in order for the PA request to be approved under both medical or pharmacy benefits the requirement is the same. The representative stated it would be best to submit an appeal if there are not any changes in the A1AT levels and include a letter of medical necessity along with a copy of the denial letter, appeal can be faxed to [phone]. I am sorry I was provided incorrect information the first time, the plan did not have an explanation as to why there was confusion however last time I was informed there was not a denial letter available and today it was so I have requested a copy. Please let me know if you have any questions or concerns, Sincerely, [place] Coordinator   I will call the pt and inform him if you would like me to.

## 2016-03-22 NOTE — Telephone Encounter (Signed)
Ok, thanks.

## 2016-03-30 ENCOUNTER — Ambulatory Visit (INDEPENDENT_AMBULATORY_CARE_PROVIDER_SITE_OTHER): Payer: Managed Care, Other (non HMO) | Admitting: Internal Medicine

## 2016-03-30 ENCOUNTER — Encounter: Payer: Self-pay | Admitting: Internal Medicine

## 2016-03-30 VITALS — BP 154/80 | HR 101 | Ht 67.0 in | Wt 178.0 lb

## 2016-03-30 DIAGNOSIS — J8410 Pulmonary fibrosis, unspecified: Secondary | ICD-10-CM | POA: Diagnosis not present

## 2016-03-30 NOTE — Progress Notes (Addendum)
* [facility]     Assessment and Plan:   60 year old male with bullous emphysema, interstitial lung disease, chronic respiratory failure.    Interstitial lung disease-possible pulmonary fibrosis. - advancing interstitial lung disease, discussed the diagnosis of pulmonary fibrosis, as well as its progressive nature. --He works in [employer]. We'll recommend that he not go back to work as continued exposures  may exacerbate his condition. -Recommended that he not go back to work for the time being.  -Cut down prednisone by 10 mg weekly, then finally to 5 mg daily for 1 week, then stop.  --Start esbriet as prescribed.  --Will check blood work.  Adddendum: CT-hi-resolution from 01/29/16 reviewed and appears consistent with UIP pattern.    COPD/Bullous emphysema.  Severe bullous emphysema, significantly advanced on most recent CT chest over the last several months. - Maintain O2 saturations greater than 88% - Anoro once daily.  -Continue to wean down prednisone by 10 mg weekly.   Alpha-1 antitrypsin deficiency. -The patient's phenotype is MZ, alpha-1 antitrypsin replacement therapy was not approved by insurance.   Acute hypoxic respiratory failure-chronic.  -Secondary to above, currently dependent on 4 L of oxygen.    Nicotine abuse. -He quit smoking about 3 months ago, discussed importance of continued smoking cessation.   Date: 03/30/2016  MRN# [id number] Austin Patterson 1955-12-05   Austin Patterson is a 60 y.o. old male seen in follow up for chief complaint of  Chief Complaint  Patient presents with  . Follow-up    pt states breathing is up & down since last OV. c/o increased sob, non prod cough, wheezing. denies cp/tightnesss     HPI:   The patient is a 60 year old male with a history advanced COPD, interstitial lung disease. He has been on Anoro, he can not really tell a difference. He continues to have a cough, he is not  taking anything for it. He continue to be on prednisone 30 mg daily. He is currently using Anoro once daily,  And proair 2 to 3 times per day.   We have tried to start the patient on alpha-1 antitrypsin replacement, as well as get him started on treatment for his IPF (Esbriet). His Zemaira was not covered secondary to his alpha-1 level being above 80. His liver function testing showed normal transaminases. He is currently on 3 pills daily.   An alpha-1 antitrypsin serum level was 158. 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  Pulmonary function testing 01/29/16: Spirometry FEV1 was 1.69 L which is 52% of predicted, there was no significant improvement with bronchodilator therapy. The FVC was 61% of predicted, the FEV to FVC ratio was 65%. Lung volumes: Residual volume was 60% of predicted, TLC was 59% of predicted, RV/TLC ratio was 112% of predicted. Diffusion capacity: 26% of predicted. Interpretation. 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  Review of most recent CT images in comparison with previous:   Review of the CT chest images from 01/29/16 showed continued severe interstitial changes throughout both lungs, worst in the upper lobes. The previously seen pneumonic changes from 12/11/15 in the left base and to a lesser degree in the right base have improved. significant bullous emphysema as well as advancing interstitial changes seen onCT chest 12/11/15, in particular his emphysematous changes on the CT appeared to have advanced significantly since the previous CT, which was 8 months  prior.    Medication:   Outpatient Encounter Prescriptions as of 03/30/2016  Medication Sig  . acetaminophen (TYLENOL) 325 MG tablet Take 2 tablets (650 mg total) by mouth every 6 (six) hours as needed for mild pain (or Fever >/= 101).  [name] albuterol (PROVENTIL HFA;VENTOLIN  HFA) 108 (90 Base) MCG/ACT inhaler Inhale 2 puffs into the lungs every 4 (four) hours as needed for wheezing or shortness of breath.  [name] amLODipine (NORVASC) 10 MG tablet Take 10 mg by mouth daily.  [name] aspirin EC 81 MG tablet Take 81 mg by mouth daily.  [name] atorvastatin (LIPITOR) 40 MG tablet Take 40 mg by mouth at bedtime.  . clopidogrel (PLAVIX) 75 MG tablet Take 75 mg by mouth daily.  [name] HYDROcodone-acetaminophen (NORCO/VICODIN) 5-325 MG tablet Take 1-2 tablets by mouth every 4 (four) hours as needed for moderate pain.  [name] loratadine (CLARITIN) 10 MG tablet Take 10 mg by mouth daily.  [name] losartan-hydrochlorothiazide (HYZAAR) 100-12.5 MG tablet Take 1 tablet by mouth daily.  . metoprolol succinate (TOPROL-XL) 50 MG 24 hr tablet Take 50 mg by mouth daily.  . pantoprazole (PROTONIX) 40 MG tablet Take 40 mg by mouth daily.  . predniSONE (DELTASONE) 10 MG tablet 5tabs daily  . umeclidinium-vilanterol (ANORO ELLIPTA) 62.5-25 MCG/INH AEPB Inhale 1 puff into the lungs daily.   No facility-administered encounter medications on file as of 03/30/2016.     Allergies:  Review of patient's allergies indicates no known allergies.  Review of Systems: Gen:  Denies  fever, sweats. HEENT: Denies blurred vision. Cvc:  No dizziness, chest pain or heaviness Resp:   Denies cough or sputum porduction. Gi: Denies swallowing difficulty, stomach pain. constipation Gu:  Denies bladder incontinence, burning urine Ext:   No Joint pain, stiffness. Skin: No skin rash, easy bruising. Endoc:  No polyuria, polydipsia. Psych: No depression, insomnia. Other:  All other systems were reviewed and found to be negative other than what is mentioned in the HPI.   Physical Examination:   VS: BP 154/80 mmHg  Pulse 101  Ht 5\' 7"  (1.702 m)  Wt 178 lb (80.74 kg)  BMI 27.87 kg/m2  SpO2 92%  General Appearance: No distress  Neuro:without focal findings,   HEENT: PERRLA, EOM intact. Pulmonary: normal breath sounds, decreased  breath sounds bilaterally.  CardiovascularNormal S1,S2.  No m/r/g.   Abdomen: Benign, Soft, non-tender. Renal:  No costovertebral tenderness  GU:  Not performed at this time. Endoc: No evident thyromegaly,  Skin:   warm, no rash. Extremities: normal, no cyanosis, clubbing.   LABORATORY PANEL:   CBC No results for input(s): WBC, HGB, HCT, PLT in the last 168 hours. ------------------------------------------------------------------------------------------------------------------  Chemistries  No results for input(s): NA, K, CL, CO2, GLUCOSE, BUN, CREATININE, CALCIUM, MG, AST, ALT, ALKPHOS, BILITOT in the last 168 hours.  Invalid input(s): GFRCGP ------------------------------------------------------------------------------------------------------------------  Cardiac Enzymes No results for input(s): TROPONINI in the last 168 hours. ------------------------------------------------------------  RADIOLOGY:   No results found for this or any previous visit. Results for orders placed during the hospital encounter of 09/03/15  DG Chest 2 View   Narrative CLINICAL DATA:  Chest pain for 1 month.  EXAM: CHEST  2 VIEW  COMPARISON:  01/23/2015  FINDINGS: Lungs are hyperaerated and clear, other than scarring at the right lateral base. Normal heart size. No pneumothorax. Chronic right rib deformities. Stable thoracic spine.  IMPRESSION: No active cardiopulmonary disease.   Electronically Signed   By: Marybelle Killings M.D.   On: 09/03/2015 07:56    ------------------------------------------------------------------------------------------------------------------  Thank  you for allowing [facility] to assist in the care of your patient. Our recommendations are noted above.  Please contact us if we can be of further service.   Marda Stalker, MD.  [facility]  Patricia Pesa, M.D.  Vilinda Boehringer, M.D.  Merton Border,  M.D  03/30/2016

## 2016-03-30 NOTE — Addendum Note (Signed)
Addended by: Oscar La R on: 03/30/2016 09:15 AM   Modules accepted: Orders

## 2016-03-30 NOTE — Patient Instructions (Addendum)
--  Continue Anoro and rescue inhaler.   --Continue esbriet.   --Continue weaning prednisone down by 10 mg weekly.   -Will check liver function panel before next visit in 3 months.

## 2016-04-02 ENCOUNTER — Other Ambulatory Visit: Payer: Self-pay | Admitting: Internal Medicine

## 2016-04-17 ENCOUNTER — Telehealth: Payer: Self-pay | Admitting: *Deleted

## 2016-04-17 NOTE — Telephone Encounter (Signed)
Spoke with Austin Patterson from [place] and explained I have been unable to get in touch with any one in the appeals dept for [place]. States they will help get the information and call me back.

## 2016-04-17 NOTE — Telephone Encounter (Signed)
-----   Message from Laverle Hobby, MD sent at 04/13/2016  3:54 PM EDT ----- Regarding: Appeal Will need to appeal decision regarding rejection of Esbriet. I added to my last note that pt appears to have "UIP Pattern" on last hi-res CT chest.

## 2016-04-22 ENCOUNTER — Ambulatory Visit: Payer: Managed Care, Other (non HMO) | Admitting: Internal Medicine

## 2016-04-22 NOTE — Telephone Encounter (Signed)
Received faxed paper from [employer] in regards to [place]. Received paper stating provider must call to initiate the appeal. Fax and all paper work given to DR to call. Will await response back from DR.

## 2016-04-22 NOTE — Telephone Encounter (Signed)
DR called and done a peer to peer and states I need to fax last ov note and HRCT results to Dr. Sandi Carne to fax # [phone].  Papers faxed per DR. Will await response.

## 2016-04-23 NOTE — Telephone Encounter (Signed)
Esbriet has been approved until 04/22/17. Will call company to inform when they open at 9am EST.

## 2016-04-24 ENCOUNTER — Telehealth: Payer: Self-pay | Admitting: Internal Medicine

## 2016-04-24 NOTE — Telephone Encounter (Signed)
Requesting progress on request for medical records.  No note or scanned doc request for [employer] on file.  Informed to call [employer] for possible update and to fax request to office so that it can be processed.  Awaiting fax request to forward to [employer].

## 2016-05-01 ENCOUNTER — Other Ambulatory Visit: Payer: Self-pay | Admitting: Internal Medicine

## 2016-06-08 ENCOUNTER — Other Ambulatory Visit
Admission: RE | Admit: 2016-06-08 | Discharge: 2016-06-08 | Disposition: A | Discharge status: Home / Self Care | Payer: Managed Care, Other (non HMO) | Source: Ambulatory Visit | Attending: Internal Medicine | Admitting: Internal Medicine

## 2016-06-08 DIAGNOSIS — J8410 Pulmonary fibrosis, unspecified: Secondary | ICD-10-CM | POA: Insufficient documentation

## 2016-06-08 LAB — HEPATIC FUNCTION PANEL
ALT: 28 U/L (ref 17–63)
AST: 39 U/L (ref 15–41)
Albumin: 4.6 g/dL (ref 3.5–5.0)
Alkaline Phosphatase: 39 U/L (ref 38–126)
BILIRUBIN TOTAL: 0.5 mg/dL (ref 0.3–1.2)
Total Protein: 7.1 g/dL (ref 6.5–8.1)

## 2016-06-30 NOTE — Progress Notes (Signed)
* [facility]     Assessment and Plan: 60 year old male with bullous emphysema, interstitial lung disease, chronic respiratory failure.    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 - advancing interstitial lung disease, discussed the diagnosis of pulmonary fibrosis, as well as its progressive nature. 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  -Cut down prednisone by 5 mg daily and stay there. If not tolerated stay on 10 mg daily.  --Continue esbriet as prescribed.  --CT-hi-resolution  consistent with UIP pattern.    COPD/Bullous emphysema.  Severe bullous emphysema, significantly advanced on most recent CT chest over the last several months. - Maintain O2 saturations greater than 88% - Anoro once daily, when runs out, continue advair.  -Continue to wean down prednisone.   Alpha-1 antitrypsin deficiency. -The patient's phenotype is MZ, alpha-1 antitrypsin replacement therapy was not approved by insurance.   Acute hypoxic respiratory failure-chronic.  -Secondary to above, continue oxygen.    Nicotine abuse. -He quit smoking about 3 months ago, discussed importance of continued smoking cessation.   Date: 06/30/2016  MRN# [id number] Austin Patterson 31-Oct-1955   Austin Patterson is a 70 y.o. old male seen in follow up for chief complaint of  Chief Complaint  Patient presents with  . Follow-up    5mo rov. pt states breathing is baseline, pt c/o sob w/exertion, mild prod cough with clear mucus, wheezing & chest discomfort. on 4L 02.      HPI:   The patient is a 60 year old male with a history advanced COPD, interstitial lung disease. He has been on Anoro.   Since his last visit he feels that his breathing has been about the same. He is no longer working and has lost his insurance. He is taking esbriet but will run out of it soon, he has an Licensed conveyancer pending. He has another 2 months worth left, he  is currently taking 3 pills tid-- the full dose.   He is continued on anoro once daily but he is not sure that it is helping. He has some advair that he can use when the anoro runs out. He is currently taper his prednisone, and is going down on 5 mg soon.   An alpha-1 antitrypsin serum level was 158. 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  Pulmonary function testing 01/29/16: Spirometry FEV1 was 1.69 L which is 52% of predicted, there was no significant improvement with bronchodilator therapy. The FVC was 61% of predicted, the FEV to FVC ratio was 65%. Lung volumes: Residual volume was 60% of predicted, TLC was 59% of predicted, RV/TLC ratio was 112% of predicted. Diffusion capacity: 26% of predicted. Interpretation. 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  Review of most recent CT images in comparison with previous:   Review of the CT chest images from 01/29/16 showed continued severe interstitial changes throughout both lungs, worst in the upper lobes. The previously seen pneumonic changes from 12/11/15 in the left base and to a lesser degree in the right base have improved. significant bullous emphysema as well as advancing interstitial changes seen onCT chest 12/11/15, in particular his emphysematous changes on the CT appeared to have advanced significantly since the previous CT, which was 8 months prior.    Medication:   Outpatient Encounter Prescriptions as of 07/01/2016  Medication Sig  . acetaminophen (TYLENOL) 325 MG tablet Take  2 tablets (650 mg total) by mouth every 6 (six) hours as needed for mild pain (or Fever >/= 101).  [name] albuterol (PROVENTIL HFA;VENTOLIN HFA) 108 (90 Base) MCG/ACT inhaler Inhale 2 puffs into the lungs every 4 (four) hours as needed for wheezing or shortness of breath.  [name] amLODipine (NORVASC) 10 MG tablet Take 10 mg by  mouth daily.  Austin Patterson ELLIPTA 62.5-25 MCG/INH AEPB INHALE ONE PUFF INTO THE LUNGS DAILY  . aspirin EC 81 MG tablet Take 81 mg by mouth daily.  [name] atorvastatin (LIPITOR) 40 MG tablet Take 40 mg by mouth at bedtime.  . clopidogrel (PLAVIX) 75 MG tablet Take 75 mg by mouth daily.  [name] HYDROcodone-acetaminophen (NORCO/VICODIN) 5-325 MG tablet Take 1-2 tablets by mouth every 4 (four) hours as needed for moderate pain.  [name] loratadine (CLARITIN) 10 MG tablet Take 10 mg by mouth daily.  [name] losartan-hydrochlorothiazide (HYZAAR) 100-12.5 MG tablet Take 1 tablet by mouth daily.  . metoprolol succinate (TOPROL-XL) 50 MG 24 hr tablet Take 50 mg by mouth daily.  . pantoprazole (PROTONIX) 40 MG tablet Take 40 mg by mouth daily.  . predniSONE (DELTASONE) 10 MG tablet TAKE FIVE (5) TABLETS BY MOUTH DAILY   No facility-administered encounter medications on file as of 07/01/2016.      Allergies:  Review of patient's allergies indicates no known allergies.  Review of Systems: Gen:  Denies  fever, sweats. HEENT: Denies blurred vision. Cvc:  No dizziness, chest pain or heaviness Resp:   Denies cough or sputum porduction. Gi: Denies swallowing difficulty, stomach pain. constipation Gu:  Denies bladder incontinence, burning urine Ext:   No Joint pain, stiffness. Skin: No skin rash, easy bruising. Endoc:  No polyuria, polydipsia. Psych: No depression, insomnia. Other:  All other systems were reviewed and found to be negative other than what is mentioned in the HPI.   Physical Examination:   VS: BP 140/90 (BP Location: Left Arm)   Pulse 100   Ht 5\' 7"  (1.702 m)   Wt 182 lb 6.4 oz (82.7 kg)   SpO2 96%   BMI 28.57 kg/m   General Appearance: No distress  Neuro:without focal findings,   HEENT: PERRLA, EOM intact. Pulmonary: normal breath sounds, decreased breath sounds bilaterally.  CardiovascularNormal S1,S2.  No m/r/g.   Abdomen: Benign, Soft, non-tender. Renal:  No costovertebral tenderness  GU:  Not  performed at this time. Endoc: No evident thyromegaly,  Skin:   warm, no rash. Extremities: normal, no cyanosis, clubbing.   LABORATORY PANEL:   CBC No results for input(s): WBC, HGB, HCT, PLT in the last 168 hours. ------------------------------------------------------------------------------------------------------------------  Chemistries  No results for input(s): NA, K, CL, CO2, GLUCOSE, BUN, CREATININE, CALCIUM, MG, AST, ALT, ALKPHOS, BILITOT in the last 168 hours.  Invalid input(s): GFRCGP ------------------------------------------------------------------------------------------------------------------  Cardiac Enzymes No results for input(s): TROPONINI in the last 168 hours. ------------------------------------------------------------  RADIOLOGY:   No results found for this or any previous visit. Results for orders placed during the hospital encounter of 09/03/15  DG Chest 2 View   Narrative CLINICAL DATA:  Chest pain for 1 month.  EXAM: CHEST  2 VIEW  COMPARISON:  01/23/2015  FINDINGS: Lungs are hyperaerated and clear, other than scarring at the right lateral base. Normal heart size. No pneumothorax. Chronic right rib deformities. Stable thoracic spine.  IMPRESSION: No active cardiopulmonary disease.   Electronically Signed   By: Marybelle Killings M.D.   On: 09/03/2015 07:56    ------------------------------------------------------------------------------------------------------------------  Thank  you for allowing  [facility] to assist in the care of your patient. Our recommendations are noted above.  Please contact us if we can be of further service.   Marda Stalker, MD.   Pulmonary and Critical Care  Patricia Pesa, M.D.  Austin Patterson, M.D.  Merton Border, M.D  06/30/2016

## 2016-07-01 ENCOUNTER — Ambulatory Visit (INDEPENDENT_AMBULATORY_CARE_PROVIDER_SITE_OTHER): Payer: Self-pay | Admitting: Internal Medicine

## 2016-07-01 ENCOUNTER — Encounter: Payer: Self-pay | Admitting: Internal Medicine

## 2016-07-01 VITALS — BP 140/90 | HR 100 | Ht 67.0 in | Wt 182.4 lb

## 2016-07-01 DIAGNOSIS — J8410 Pulmonary fibrosis, unspecified: Secondary | ICD-10-CM

## 2016-07-01 NOTE — Patient Instructions (Signed)
--  Continue current medications.   --Start advair when [employer] runs out.   --Wean down prednisone as tolerated, stay at 5 to 10 daily.

## 2016-07-03 ENCOUNTER — Telehealth: Payer: Self-pay

## 2016-07-03 NOTE — Telephone Encounter (Signed)
Patient dropped off financial assistance application and medication management application .  Faxed to appropriate offices along with patient financial information.

## 2016-07-16 ENCOUNTER — Ambulatory Visit: Payer: Self-pay

## 2016-07-29 ENCOUNTER — Ambulatory Visit: Payer: Self-pay

## 2016-08-18 ENCOUNTER — Telehealth: Payer: Self-pay | Admitting: Internal Medicine

## 2016-08-18 NOTE — Telephone Encounter (Signed)
Patient has informed that he has new insurance.  Fax referral form was faxed to [facility] .  Please complete and send back.

## 2016-08-18 NOTE — Telephone Encounter (Signed)
Form received and placed in Dr's folder to look at and sign. Will await form back from DR.

## 2016-08-19 ENCOUNTER — Encounter: Payer: Self-pay | Admitting: Internal Medicine

## 2016-08-19 ENCOUNTER — Ambulatory Visit: Payer: Self-pay | Admitting: Internal Medicine

## 2016-08-19 VITALS — BP 159/100 | HR 99 | Temp 98.2°F | Wt 180.0 lb

## 2016-08-19 DIAGNOSIS — J8410 Pulmonary fibrosis, unspecified: Secondary | ICD-10-CM

## 2016-08-19 MED ORDER — LOSARTAN POTASSIUM-HCTZ 100-12.5 MG PO TABS: 1.0000 | ORAL_TABLET | Freq: Every day | ORAL | 3 refills | Status: DC

## 2016-08-19 MED ORDER — ASPIRIN EC 81 MG PO TBEC: 81.0000 mg | DELAYED_RELEASE_TABLET | Freq: Every day | ORAL | 3 refills | Status: DC

## 2016-08-19 MED ORDER — UMECLIDINIUM-VILANTEROL 62.5-25 MCG/INH IN AEPB: INHALATION_SPRAY | RESPIRATORY_TRACT | 1 refills | Status: DC

## 2016-08-19 MED ORDER — ATORVASTATIN CALCIUM 40 MG PO TABS: 40.0000 mg | ORAL_TABLET | Freq: Every day | ORAL | 3 refills | Status: DC

## 2016-08-19 MED ORDER — METOPROLOL SUCCINATE ER 50 MG PO TB24: 50.0000 mg | ORAL_TABLET | Freq: Every day | ORAL | 3 refills | Status: DC

## 2016-08-19 MED ORDER — CLOPIDOGREL BISULFATE 75 MG PO TABS: 75.0000 mg | ORAL_TABLET | Freq: Every day | ORAL | 3 refills | Status: DC

## 2016-08-19 MED ORDER — ALBUTEROL SULFATE HFA 108 (90 BASE) MCG/ACT IN AERS: 2.0000 | INHALATION_SPRAY | RESPIRATORY_TRACT | 3 refills | Status: DC | PRN

## 2016-08-19 MED ORDER — PANTOPRAZOLE SODIUM 40 MG PO TBEC: 40.0000 mg | DELAYED_RELEASE_TABLET | Freq: Every day | ORAL | 3 refills | Status: DC

## 2016-08-19 MED ORDER — AMLODIPINE BESYLATE 10 MG PO TABS: 10.0000 mg | ORAL_TABLET | Freq: Every day | ORAL | 3 refills | Status: DC

## 2016-08-19 MED ORDER — PREDNISONE 10 MG PO TABS: ORAL_TABLET | ORAL | 3 refills | Status: DC

## 2016-08-19 MED ORDER — LORATADINE 10 MG PO TABS: 10.0000 mg | ORAL_TABLET | Freq: Every day | ORAL | 3 refills | Status: DC

## 2016-08-19 NOTE — Progress Notes (Signed)
   Subjective:    Patient ID: Austin Patterson, male    DOB: August 17, 1956, 60 y.o.   MRN: [id number]  HPI   Pt seen to establish care here.  History of chronic emphysema and idiopathic pulmonary fibrosis. Diagnosed Feb 2017.    Patient Active Problem List   Diagnosis Date Noted  . Pneumonia 12/09/2015  . COPD with acute exacerbation ([place]) 10/14/2015     Medication List       Accurate as of 08/19/16  9:49 AM. Always use your most recent med list.          albuterol 108 (90 Base) MCG/ACT inhaler Commonly known as:  PROVENTIL HFA;VENTOLIN HFA Inhale 2 puffs into the lungs every 4 (four) hours as needed for wheezing or shortness of breath.   amLODipine 10 MG tablet Commonly known as:  NORVASC Take 10 mg by mouth daily.   ANORO ELLIPTA 62.5-25 MCG/INH Aepb Generic drug:  umeclidinium-vilanterol INHALE ONE PUFF INTO THE LUNGS DAILY   aspirin EC 81 MG tablet Take 81 mg by mouth daily.   atorvastatin 40 MG tablet Commonly known as:  LIPITOR Take 40 mg by mouth at bedtime.   clopidogrel 75 MG tablet Commonly known as:  PLAVIX Take 75 mg by mouth daily.   loratadine 10 MG tablet Commonly known as:  CLARITIN Take 10 mg by mouth daily.   losartan-hydrochlorothiazide 100-12.5 MG tablet Commonly known as:  HYZAAR Take 1 tablet by mouth daily.   metoprolol succinate 50 MG 24 hr tablet Commonly known as:  TOPROL-XL Take 50 mg by mouth daily.   pantoprazole 40 MG tablet Commonly known as:  PROTONIX Take 40 mg by mouth daily.   predniSONE 10 MG tablet Commonly known as:  DELTASONE TAKE FIVE (5) TABLETS BY MOUTH DAILY        Review of Systems     Objective:   Physical Exam  Constitutional: He is oriented to person, place, and time.  Cardiovascular: Normal rate, regular rhythm and normal heart sounds.   Pulmonary/Chest: Effort normal and breath sounds normal.  Neurological: He is alert and oriented to person, place, and time.    BP (!) 159/100   Pulse 99    Temp 98.2 F (36.8 C) (Oral)   Wt 180 lb (81.6 kg)   BMI 28.19 kg/m        Assessment & Plan:   Pt was approved for Medicaid this week. Will need to establish care with a doctor.

## 2016-08-20 ENCOUNTER — Ambulatory Visit: Payer: Self-pay | Admitting: Pharmacist

## 2016-08-20 ENCOUNTER — Encounter: Payer: Self-pay | Admitting: Pharmacist

## 2016-08-20 ENCOUNTER — Encounter (INDEPENDENT_AMBULATORY_CARE_PROVIDER_SITE_OTHER): Payer: Self-pay

## 2016-08-20 VITALS — BP 157/89 | Ht 66.0 in | Wt 180.0 lb

## 2016-08-20 DIAGNOSIS — Z79899 Other long term (current) drug therapy: Secondary | ICD-10-CM

## 2016-08-20 NOTE — Progress Notes (Signed)
Mr. Dicker has been approved for disability and will have medicaid next month. He wishes to obtain medications from [facility] this month until his medicaid is available. The objectives of this visit were to reconcile pt med profile, assess which medications we can provide, and assess his knowledge and compliance of pt medications.  During this visit we reviewed and reconciled pt's medication profile.   Current Outpatient Prescriptions  Medication Sig Dispense Refill  . albuterol (PROVENTIL HFA;VENTOLIN HFA) 108 (90 Base) MCG/ACT inhaler Inhale 2 puffs into the lungs every 4 (four) hours as needed for wheezing or shortness of breath. 3 Inhaler 3  . amLODipine (NORVASC) 10 MG tablet Take 1 tablet (10 mg total) by mouth daily. 90 tablet 3  . aspirin EC 81 MG tablet Take 1 tablet (81 mg total) by mouth daily. 90 tablet 3  . atorvastatin (LIPITOR) 40 MG tablet Take 1 tablet (40 mg total) by mouth at bedtime. 90 tablet 3  . cetirizine (ZYRTEC) 10 MG tablet Take 10 mg by mouth at bedtime.    . clopidogrel (PLAVIX) 75 MG tablet Take 1 tablet (75 mg total) by mouth daily. 90 tablet 3  . losartan-hydrochlorothiazide (HYZAAR) 100-12.5 MG tablet Take 1 tablet by mouth daily. 90 tablet 3  . metoprolol succinate (TOPROL-XL) 50 MG 24 hr tablet Take 1 tablet (50 mg total) by mouth daily. 90 tablet 3  . pantoprazole (PROTONIX) 40 MG tablet Take 1 tablet (40 mg total) by mouth daily. 90 tablet 3  . predniSONE (DELTASONE) 10 MG tablet TAKE 1 TABLET BY MOUTH DAILY 90 tablet 3  . umeclidinium-vilanterol (ANORO ELLIPTA) 62.5-25 MCG/INH AEPB INHALE ONE PUFF INTO THE LUNGS DAILY 90 each 1   No current facility-administered medications for this visit.     We are able to provide each medication on his list.  When prompted with the drug name pt knew the medication/indication/frequency. Pt has been obtaining prescriptions from [facility] prior to picking up at [place], PharmD,  [facility]) [phone]

## 2016-08-21 NOTE — Telephone Encounter (Signed)
Papers signed and faxed back to [facility]. Nothing further needed.

## 2016-08-24 ENCOUNTER — Telehealth: Payer: Self-pay | Admitting: *Deleted

## 2016-08-24 NOTE — Telephone Encounter (Signed)
Received call from [place] from [place] [facility]. She ask we fill sections 3 & 5 on the forms and fax them back. States since pt ahs new insurance she may be able to get him qualified. Will await forms and fax them back once filled out.

## 2016-08-30 NOTE — Progress Notes (Signed)
* [facility]     Assessment and Plan: 60 year old male with bullous emphysema, interstitial lung disease, chronic respiratory failure.    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 - advancing interstitial lung disease, discussed the diagnosis of pulmonary fibrosis, as well as its progressive nature. 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  -Cut down prednisone to 7.5 mg daily, he was asked that if he notices symptoms of dizziness when he stands, excess fatigue, worsening breathing, he should go back to 10 mg daily.  --Continue esbriet as prescribed.     COPD/Bullous emphysema.  Severe bullous emphysema, significantly advanced on most recent CT chest over the last several months. - Maintain O2 saturations greater than 88% - Anoro once daily. -Continue to wean down prednisone.   Alpha-1 antitrypsin deficiency. -The patient's phenotype is MZ, alpha-1 antitrypsin replacement therapy was not approved by insurance, and previous application, we have reapplied.   Acute hypoxic respiratory failure-chronic.  -Secondary to above, continue oxygen.    Nicotine abuse. -He quit smoking about 1 year ago, discussed importance of continued smoking cessation. 3 min spent in counseling.    Date: 08/30/2016  MRN# [id number] Austin Patterson December 24, 1955   Austin Patterson is a 60 y.o. old male seen in follow up for chief complaint of  Chief Complaint  Patient presents with  . Follow-up    6wk rov. pt states breathing is baseline. pt c/o sob with exertion, mild prod cough with clear mucus & occ chest discomfort. pt currently on 10mg  prednisone daily after attempting to cut back to 5mg  daily X1 week.     HPI:   The patient is a 60 year old male with a history advanced COPD, interstitial lung disease. He has been on Anoro.   Since his last visit he feels that his breathing has been about the same. He is no longer working and  is now on disability, he is on oxygen at 2L at rest and 4L with activity. He has never gone through pulmonary rehab. He is currently on prednisone 10 mg, he went back to 5, but he had greater congestion and dyspnea, and some weakness/fatigue.  He is taking esbriet 3 tabs 3 times per day.    An alpha-1 antitrypsin serum level was 158. 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   Pulmonary function testing 01/29/16: Spirometry FEV1 was 1.69 L which is 52% of predicted, there was no significant improvement with bronchodilator therapy. The FVC was 61% of predicted, the FEV to FVC ratio was 65%. Lung volumes: Residual volume was 60% of predicted, TLC was 59% of predicted, RV/TLC ratio was 112% of predicted. Diffusion capacity: 26% of predicted. Interpretation. 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   CT chest 01/29/16: continued severe interstitial changes throughout both lungs, worst in the upper lobes. The previously seen pneumonic changes from 12/11/15 in the left base and to a lesser degree in the right base have improved. significant bullous emphysema as well as advancing interstitial changes seen onCT chest 12/11/15, in particular his emphysematous changes on the CT appeared to have advanced significantly since the previous CT, which was 8 months prior.    Medication:   Outpatient Encounter Prescriptions as of 08/31/2016  Medication Sig  . albuterol (PROVENTIL HFA;VENTOLIN HFA) 108 (90 Base) MCG/ACT inhaler Inhale 2 puffs into the lungs every 4 (four) hours as needed  for wheezing or shortness of breath.  [name] amLODipine (NORVASC) 10 MG tablet Take 1 tablet (10 mg total) by mouth daily.  [name] aspirin EC 81 MG tablet Take 1 tablet (81 mg total) by mouth daily.  [name] atorvastatin (LIPITOR) 40 MG tablet Take 1 tablet (40 mg total) by mouth at bedtime.  . cetirizine  (ZYRTEC) 10 MG tablet Take 10 mg by mouth at bedtime.  . clopidogrel (PLAVIX) 75 MG tablet Take 1 tablet (75 mg total) by mouth daily.  [name] losartan-hydrochlorothiazide (HYZAAR) 100-12.5 MG tablet Take 1 tablet by mouth daily.  . metoprolol succinate (TOPROL-XL) 50 MG 24 hr tablet Take 1 tablet (50 mg total) by mouth daily.  . pantoprazole (PROTONIX) 40 MG tablet Take 1 tablet (40 mg total) by mouth daily.  . predniSONE (DELTASONE) 10 MG tablet TAKE 1 TABLET BY MOUTH DAILY  . umeclidinium-vilanterol (ANORO ELLIPTA) 62.5-25 MCG/INH AEPB INHALE ONE PUFF INTO THE LUNGS DAILY   No facility-administered encounter medications on file as of 08/31/2016.      Allergies:  Patient has no known allergies.  Review of Systems: Gen:  Denies  fever, sweats. HEENT: Denies blurred vision. Cvc:  No dizziness, chest pain or heaviness Resp:   Denies cough or sputum porduction. Gi: Denies swallowing difficulty, stomach pain. constipation Gu:  Denies bladder incontinence, burning urine Ext:   No Joint pain, stiffness. Skin: No skin rash, easy bruising. Endoc:  No polyuria, polydipsia. Psych: No depression, insomnia. Other:  All other systems were reviewed and found to be negative other than what is mentioned in the HPI.   Physical Examination:   VS: BP (!) 142/84 (BP Location: Left Arm, Cuff Size: Normal)   Pulse (!) 103   Ht 5\' 7"  (1.702 m)   Wt 181 lb 3.2 oz (82.2 kg)   SpO2 94%   BMI 28.38 kg/m   General Appearance: No distress  Neuro:without focal findings,   HEENT: PERRLA, EOM intact. Pulmonary: scattered crackles decreased breath sounds bilaterally.  CardiovascularNormal S1,S2.  No m/r/g.   Abdomen: Benign, Soft, non-tender. Renal:  No costovertebral tenderness  GU:  Not performed at this time. Endoc: No evident thyromegaly,  Skin:   warm, no rash. Extremities: normal, no cyanosis, clubbing.   LABORATORY PANEL:   CBC No results for input(s): WBC, HGB, HCT, PLT in the last 168  hours. ------------------------------------------------------------------------------------------------------------------  Chemistries  No results for input(s): NA, K, CL, CO2, GLUCOSE, BUN, CREATININE, CALCIUM, MG, AST, ALT, ALKPHOS, BILITOT in the last 168 hours.  Invalid input(s): GFRCGP ------------------------------------------------------------------------------------------------------------------  Cardiac Enzymes No results for input(s): TROPONINI in the last 168 hours. ------------------------------------------------------------  RADIOLOGY:   No results found for this or any previous visit. Results for orders placed during the hospital encounter of 09/03/15  DG Chest 2 View   Narrative CLINICAL DATA:  Chest pain for 1 month.  EXAM: CHEST  2 VIEW  COMPARISON:  01/23/2015  FINDINGS: Lungs are hyperaerated and clear, other than scarring at the right lateral base. Normal heart size. No pneumothorax. Chronic right rib deformities. Stable thoracic spine.  IMPRESSION: No active cardiopulmonary disease.   Electronically Signed   By: Marybelle Killings M.D.   On: 09/03/2015 07:56    ------------------------------------------------------------------------------------------------------------------  Thank  you for allowing [facility] to assist in the care of your patient. Our recommendations are noted above.  Please contact us if we can be of further service.   Marda Stalker, MD.  [facility]  Patricia Pesa, M.D.  Vilinda Boehringer, M.D.  Merton Border, M.D  08/30/2016

## 2016-08-31 ENCOUNTER — Ambulatory Visit (INDEPENDENT_AMBULATORY_CARE_PROVIDER_SITE_OTHER): Payer: Medicaid Other | Admitting: Internal Medicine

## 2016-08-31 ENCOUNTER — Encounter: Payer: Self-pay | Admitting: Internal Medicine

## 2016-08-31 VITALS — BP 142/84 | HR 103 | Ht 67.0 in | Wt 181.2 lb

## 2016-08-31 DIAGNOSIS — J449 Chronic obstructive pulmonary disease, unspecified: Secondary | ICD-10-CM | POA: Diagnosis not present

## 2016-08-31 DIAGNOSIS — Z23 Encounter for immunization: Secondary | ICD-10-CM | POA: Diagnosis not present

## 2016-08-31 DIAGNOSIS — J8410 Pulmonary fibrosis, unspecified: Secondary | ICD-10-CM | POA: Diagnosis not present

## 2016-08-31 MED ORDER — PREDNISONE 5 MG PO TABS: ORAL_TABLET | ORAL | 3 refills | Status: DC

## 2016-08-31 NOTE — Patient Instructions (Addendum)
Prednisone 5 mg tabs-- take one and a half daily (7.5). If you notice worsening breathing or severe fatigue, go back to 10 mg.   --Flu shot today.   --Pulmonary rehab.   --Please see your primary care provider to check you for diabetes.

## 2016-09-06 ENCOUNTER — Encounter: Payer: Self-pay | Admitting: Emergency Medicine

## 2016-09-06 ENCOUNTER — Emergency Department
Admission: EM | Admit: 2016-09-06 | Discharge: 2016-09-06 | Disposition: A | Discharge status: Home / Self Care | Payer: Medicaid Other | Attending: Emergency Medicine | Admitting: Emergency Medicine

## 2016-09-06 DIAGNOSIS — R040 Epistaxis: Secondary | ICD-10-CM | POA: Diagnosis present

## 2016-09-06 DIAGNOSIS — I10 Essential (primary) hypertension: Secondary | ICD-10-CM | POA: Insufficient documentation

## 2016-09-06 DIAGNOSIS — J449 Chronic obstructive pulmonary disease, unspecified: Secondary | ICD-10-CM | POA: Diagnosis not present

## 2016-09-06 DIAGNOSIS — Z87891 Personal history of nicotine dependence: Secondary | ICD-10-CM | POA: Diagnosis not present

## 2016-09-06 DIAGNOSIS — Z7982 Long term (current) use of aspirin: Secondary | ICD-10-CM | POA: Insufficient documentation

## 2016-09-06 DIAGNOSIS — Z79899 Other long term (current) drug therapy: Secondary | ICD-10-CM | POA: Insufficient documentation

## 2016-09-06 NOTE — ED Triage Notes (Signed)
Pt presents with spontaneous nosebleed out of right nostril, "same side it always bleeds out of"; currently bleeding has stopped; pt wears oxygen 24/7; mild headache; pt ambulatory with steady gait; talking in complete coherent sentences

## 2016-09-06 NOTE — ED Notes (Signed)
Reviewed d/c instructions, follow-up care with patient. Pt verbalized understanding.  

## 2016-09-06 NOTE — Discharge Instructions (Signed)
As we discussed, there are several techniques you can use to prevent or stop nosebleeds in the future.  Keep your nose moist either with saline spray several times a day or by applying a thin layer of Neosporin, bacitracin, or other antibiotic ointment to the inside of your nose once or twice a day.  If the bleeding starts up again, gently blow your nose into a tissue to clear the blood and clots. Then squeeze your nose shut tightly and DO NOT PEEK for at least 15 minutes.  This will resolve most nosebleeds.  If you continue to have trouble after trying these techniques, or anything seems out of the ordinary or concerns you, please return tot he Emergency Department.

## 2016-09-06 NOTE — ED Provider Notes (Signed)
[facility] Emergency Department Provider Note   ____________________________________________   First MD Initiated Contact with Patient 09/06/16 0501     (approximate)  I have reviewed the triage vital signs and the nursing notes.   HISTORY  Chief Complaint Epistaxis    HPI Austin Patterson is a 60 y.o. male reports that the right side of his nose began bleeding just prior to arrival. Reports had the same multiple times. Bleeding stopped just after arrival to the ER after he had placed the tissue paper into the nostril which has since removed.  No headache, nausea, vomiting. No shortness of breath, on normal 4 L oxygen at home  Denies taking any blood thinners  Denies any heavy or large amount of bleeding.  Past Medical History:  Diagnosis Date  . COPD (chronic obstructive pulmonary disease) ([place])   . Hyperlipidemia   . Hypertension     Patient Active Problem List   Diagnosis Date Noted  . Pneumonia 12/09/2015  . COPD with acute exacerbation ([place]) 10/14/2015    Past Surgical History:  Procedure Laterality Date  . CORONARY STENT PLACEMENT    . SHOULDER ACROMIOPLASTY      Prior to Admission medications   Medication Sig Start Date End Date Taking? Authorizing Provider  albuterol (PROVENTIL HFA;VENTOLIN HFA) 108 (90 Base) MCG/ACT inhaler Inhale 2 puffs into the lungs every 4 (four) hours as needed for wheezing or shortness of breath. 08/19/16   Tawni Millers, MD  amLODipine (NORVASC) 10 MG tablet Take 1 tablet (10 mg total) by mouth daily. 08/19/16   Tawni Millers, MD  aspirin EC 81 MG tablet Take 1 tablet (81 mg total) by mouth daily. 08/19/16   Tawni Millers, MD  atorvastatin (LIPITOR) 40 MG tablet Take 1 tablet (40 mg total) by mouth at bedtime. 08/19/16   Tawni Millers, MD  cetirizine (ZYRTEC) 10 MG tablet Take 10 mg by mouth at bedtime. 08/19/16   Historical Provider, MD  clopidogrel (PLAVIX) 75 MG tablet Take 1 tablet (75 mg total) by  mouth daily. 08/19/16   Tawni Millers, MD  losartan-hydrochlorothiazide (HYZAAR) 100-12.5 MG tablet Take 1 tablet by mouth daily. 08/19/16   Tawni Millers, MD  metoprolol succinate (TOPROL-XL) 50 MG 24 hr tablet Take 1 tablet (50 mg total) by mouth daily. 08/19/16   Tawni Millers, MD  pantoprazole (PROTONIX) 40 MG tablet Take 1 tablet (40 mg total) by mouth daily. 08/19/16   Tawni Millers, MD  predniSONE (DELTASONE) 10 MG tablet TAKE 1 TABLET BY MOUTH DAILY 08/19/16   Tawni Millers, MD  predniSONE (DELTASONE) 5 MG tablet Take 1.5 tablet daily 08/31/16   Laverle Hobby, MD  umeclidinium-vilanterol [facility] ELLIPTA) 62.5-25 MCG/INH AEPB INHALE ONE PUFF INTO THE LUNGS DAILY 08/19/16   Tawni Millers, MD    Allergies Patient has no known allergies.  Family History  Problem Relation Age of Onset  . Heart disease Mother     Social History Social History  Substance Use Topics  . Smoking status: Former Smoker    Quit date: 10/14/2015  . Smokeless tobacco: Former Systems developer  . Alcohol use 16.8 oz/week    28 Cans of beer per week     Comment: occasional     Review of Systems Constitutional: No fever/chills Eyes: No visual changes. ENT: No sore throat. Cardiovascular: Denies chest pain. Respiratory: Denies shortness of breath. Gastrointestinal: No abdominal pain.  No nausea, no vomiting.  No diarrhea.  No constipation. Genitourinary: Negative for dysuria. Musculoskeletal: Negative for back pain. Skin: Negative for rash. Neurological: Negative for headaches, focal weakness or numbness.  10-point ROS otherwise negative.  ____________________________________________   PHYSICAL EXAM:  VITAL SIGNS: ED Triage Vitals  Enc Vitals Group     BP 09/06/16 0200 (!) 163/96     Pulse Rate 09/06/16 0200 (!) 104     Resp 09/06/16 0200 (!) 22     Temp 09/06/16 0200 97.8 F (36.6 C)     Temp Source 09/06/16 0200 Oral     SpO2 09/06/16 0200 96 %     Weight 09/06/16 0201 181 lb (82.1 kg)     Height  09/06/16 0201 5\' 7"  (1.702 m)     Head Circumference --      Peak Flow --      Pain Score 09/06/16 0201 0     Pain Loc --      Pain Edu? --      Excl. in [place]? --     Constitutional: Alert and oriented. Well appearing and in no acute distress.Very pleasant Eyes: Conjunctivae are normal. PERRL. EOMI. Head: Atraumatic. Nose: No congestion/rhinnorhea. Mouth/Throat: Mucous membranes are moist.  Oropharynx non-erythematous. There is no bleeding in the posterior pharynx. No evidence of ongoing bleeding, there is a adherent clot noted in the anterior septum of the right nasal passage. The left nasal passages clear. Neck: No stridor.   Cardiovascular: Normal rate, regular rhythm. Grossly normal heart sounds.  Good peripheral circulation. Respiratory: Normal respiratory effort.  No retractions. Mild dry crackles bases. Gastrointestinal: Soft and nontender.  Musculoskeletal: No lower extremity tenderness nor edema.  Neurologic:  Normal speech and language. No gross focal neurologic deficits are appreciated.  Skin:  Skin is warm, dry and intact. No rash noted. Psychiatric: Mood and affect are normal. Speech and behavior are normal.  ____________________________________________   LABS (all labs ordered are listed, but only abnormal results are displayed)  Labs Reviewed - No data to display ____________________________________________  EKG   ____________________________________________  RADIOLOGY   ____________________________________________   PROCEDURES  Procedure(s) performed: None  Procedures  Critical Care performed: No  ____________________________________________   INITIAL IMPRESSION / ASSESSMENT AND PLAN / ED COURSE  Pertinent labs & imaging results that were available during my care of the patient were reviewed by me and considered in my medical decision making (see chart for details).  Patient presents for self resolved epistaxis. Adherent clot right septum,  patient reports same in the past. He wears home oxygen and I suspect likely secondary to chronic oxygen therapy. He has humidified set up at home but reports he doesn't use it often and will begin using this. Discuss careful return precautions and treatment recommendations. He will follow up with ear nose and throat.  Patient does take Plavix, but takes no anticoagulants.  Clinical Course      ____________________________________________   FINAL CLINICAL IMPRESSION(S) / ED DIAGNOSES  Final diagnoses:  Epistaxis  Right-sided epistaxis  Anterior epistaxis      NEW MEDICATIONS STARTED DURING THIS VISIT:  Discharge Medication List as of 09/06/2016  6:13 AM       Note:  This document was prepared using Dragon voice recognition software and may include unintentional dictation errors.     Delman Kitten, MD 09/06/16 [phone]

## 2016-09-06 NOTE — ED Notes (Signed)
Pt ambulatory with steady gait to restroom; back to waiting in seat in lobby;

## 2016-09-06 NOTE — ED Notes (Signed)
Pt reports awaking at 0000 today with epistaxis. Pt reports bleeding stopped in triage. Pt denies current bleeding patient denies current pain.   Patient reports hx of idiopathic pulmonary fibrosis. Pt wears 4L [place] O2 at home. Pt reports hx of multiple episodes of epistaxis due to oxygen.

## 2016-09-21 ENCOUNTER — Inpatient Hospital Stay
Admission: EM | Admit: 2016-09-21 | Discharge: 2016-09-23 | DRG: 392 | Disposition: A | Discharge status: Home / Self Care | Payer: Medicaid Other | Attending: Internal Medicine | Admitting: Internal Medicine

## 2016-09-21 DIAGNOSIS — Z6827 Body mass index (BMI) 27.0-27.9, adult: Secondary | ICD-10-CM | POA: Diagnosis not present

## 2016-09-21 DIAGNOSIS — Z79899 Other long term (current) drug therapy: Secondary | ICD-10-CM

## 2016-09-21 DIAGNOSIS — K2921 Alcoholic gastritis with bleeding: Secondary | ICD-10-CM

## 2016-09-21 DIAGNOSIS — Z7902 Long term (current) use of antithrombotics/antiplatelets: Secondary | ICD-10-CM

## 2016-09-21 DIAGNOSIS — F1010 Alcohol abuse, uncomplicated: Secondary | ICD-10-CM | POA: Diagnosis present

## 2016-09-21 DIAGNOSIS — I10 Essential (primary) hypertension: Secondary | ICD-10-CM | POA: Diagnosis present

## 2016-09-21 DIAGNOSIS — J84112 Idiopathic pulmonary fibrosis: Secondary | ICD-10-CM | POA: Diagnosis present

## 2016-09-21 DIAGNOSIS — G8929 Other chronic pain: Secondary | ICD-10-CM | POA: Diagnosis present

## 2016-09-21 DIAGNOSIS — K922 Gastrointestinal hemorrhage, unspecified: Secondary | ICD-10-CM | POA: Diagnosis not present

## 2016-09-21 DIAGNOSIS — E871 Hypo-osmolality and hyponatremia: Secondary | ICD-10-CM | POA: Diagnosis present

## 2016-09-21 DIAGNOSIS — J449 Chronic obstructive pulmonary disease, unspecified: Secondary | ICD-10-CM | POA: Diagnosis present

## 2016-09-21 DIAGNOSIS — E669 Obesity, unspecified: Secondary | ICD-10-CM | POA: Diagnosis present

## 2016-09-21 DIAGNOSIS — K2920 Alcoholic gastritis without bleeding: Secondary | ICD-10-CM | POA: Diagnosis present

## 2016-09-21 DIAGNOSIS — E878 Other disorders of electrolyte and fluid balance, not elsewhere classified: Secondary | ICD-10-CM | POA: Diagnosis present

## 2016-09-21 DIAGNOSIS — K2100 Gastro-esophageal reflux disease with esophagitis, without bleeding: Secondary | ICD-10-CM

## 2016-09-21 DIAGNOSIS — Z8701 Personal history of pneumonia (recurrent): Secondary | ICD-10-CM

## 2016-09-21 DIAGNOSIS — K920 Hematemesis: Secondary | ICD-10-CM

## 2016-09-21 DIAGNOSIS — I25119 Atherosclerotic heart disease of native coronary artery with unspecified angina pectoris: Secondary | ICD-10-CM | POA: Diagnosis present

## 2016-09-21 DIAGNOSIS — Y906 Blood alcohol level of 120-199 mg/100 ml: Secondary | ICD-10-CM | POA: Diagnosis present

## 2016-09-21 DIAGNOSIS — Z8249 Family history of ischemic heart disease and other diseases of the circulatory system: Secondary | ICD-10-CM

## 2016-09-21 DIAGNOSIS — R079 Chest pain, unspecified: Secondary | ICD-10-CM

## 2016-09-21 DIAGNOSIS — K449 Diaphragmatic hernia without obstruction or gangrene: Secondary | ICD-10-CM | POA: Diagnosis present

## 2016-09-21 DIAGNOSIS — E785 Hyperlipidemia, unspecified: Secondary | ICD-10-CM | POA: Diagnosis present

## 2016-09-21 DIAGNOSIS — Z9981 Dependence on supplemental oxygen: Secondary | ICD-10-CM

## 2016-09-21 DIAGNOSIS — Z7952 Long term (current) use of systemic steroids: Secondary | ICD-10-CM

## 2016-09-21 DIAGNOSIS — Z7982 Long term (current) use of aspirin: Secondary | ICD-10-CM

## 2016-09-21 DIAGNOSIS — R748 Abnormal levels of other serum enzymes: Secondary | ICD-10-CM | POA: Diagnosis present

## 2016-09-21 DIAGNOSIS — E876 Hypokalemia: Secondary | ICD-10-CM | POA: Diagnosis present

## 2016-09-21 DIAGNOSIS — R0902 Hypoxemia: Secondary | ICD-10-CM | POA: Diagnosis present

## 2016-09-21 DIAGNOSIS — D649 Anemia, unspecified: Secondary | ICD-10-CM | POA: Diagnosis present

## 2016-09-21 DIAGNOSIS — Z87891 Personal history of nicotine dependence: Secondary | ICD-10-CM

## 2016-09-21 DIAGNOSIS — Z955 Presence of coronary angioplasty implant and graft: Secondary | ICD-10-CM | POA: Diagnosis not present

## 2016-09-21 HISTORY — DX: Pulmonary fibrosis, unspecified: J84.10

## 2016-09-21 LAB — CBC
HEMATOCRIT: 34.6 % — AB (ref 40.0–52.0)
Hemoglobin: 12.5 g/dL — ABNORMAL LOW (ref 13.0–18.0)
MCH: 38.6 pg — ABNORMAL HIGH (ref 26.0–34.0)
MCHC: 36.2 g/dL — AB (ref 32.0–36.0)
MCV: 106.7 fL — ABNORMAL HIGH (ref 80.0–100.0)
PLATELETS: 217 10*3/uL (ref 150–440)
RBC: 3.24 MIL/uL — ABNORMAL LOW (ref 4.40–5.90)
RDW: 15.2 % — AB (ref 11.5–14.5)
WBC: 8.9 10*3/uL (ref 3.8–10.6)

## 2016-09-21 LAB — COMPREHENSIVE METABOLIC PANEL
ALBUMIN: 4.5 g/dL (ref 3.5–5.0)
ALK PHOS: 26 U/L — AB (ref 38–126)
ALT: 46 U/L (ref 17–63)
AST: 88 U/L — AB (ref 15–41)
Anion gap: 22 — ABNORMAL HIGH (ref 5–15)
BILIRUBIN TOTAL: 1.1 mg/dL (ref 0.3–1.2)
BUN: 11 mg/dL (ref 6–20)
CO2: 21 mmol/L — ABNORMAL LOW (ref 22–32)
CREATININE: 0.71 mg/dL (ref 0.61–1.24)
Calcium: 6.8 mg/dL — ABNORMAL LOW (ref 8.9–10.3)
Chloride: 72 mmol/L — ABNORMAL LOW (ref 101–111)
GFR calc Af Amer: >60 mL/min (ref >60)
GLUCOSE: 109 mg/dL — AB (ref 65–99)
Sodium: 115 mmol/L — CL (ref 135–145)
TOTAL PROTEIN: 6.9 g/dL (ref 6.5–8.1)

## 2016-09-21 LAB — BASIC METABOLIC PANEL
ANION GAP: 11 (ref 5–15)
BUN: 7 mg/dL (ref 6–20)
CHLORIDE: 86 mmol/L — AB (ref 101–111)
CO2: 30 mmol/L (ref 22–32)
Calcium: 7.1 mg/dL — ABNORMAL LOW (ref 8.9–10.3)
Creatinine, Ser: 0.7 mg/dL (ref 0.61–1.24)
GFR calc Af Amer: >60 mL/min (ref >60)
GFR calc non Af Amer: >60 mL/min (ref >60)
GLUCOSE: 105 mg/dL — AB (ref 65–99)
POTASSIUM: 3.1 mmol/L — AB (ref 3.5–5.1)
Sodium: 127 mmol/L — ABNORMAL LOW (ref 135–145)

## 2016-09-21 LAB — ETHANOL: Alcohol, Ethyl (B): 170 mg/dL — ABNORMAL HIGH (ref <5)

## 2016-09-21 LAB — LIPASE, BLOOD: Lipase: 37 U/L (ref 11–51)

## 2016-09-21 LAB — MAGNESIUM
MAGNESIUM: 0.5 mg/dL — AB (ref 1.7–2.4)
Magnesium: 1.6 mg/dL — ABNORMAL LOW (ref 1.7–2.4)

## 2016-09-21 LAB — PHOSPHORUS: Phosphorus: 2.4 mg/dL — ABNORMAL LOW (ref 2.5–4.6)

## 2016-09-21 LAB — TROPONIN I: Troponin I: <0.03 ng/mL (ref <0.03)

## 2016-09-21 MED ORDER — MAGNESIUM SULFATE 2 GM/50ML IV SOLN
2.0000 g | Freq: Once | INTRAVENOUS | Status: AC
  Administered 2016-09-21: 2 g via INTRAVENOUS
  Filled 2016-09-21: qty 50

## 2016-09-21 MED ORDER — ACETAMINOPHEN 650 MG RE SUPP: 650.0000 mg | Freq: Four times a day (QID) | RECTAL | Status: DC | PRN

## 2016-09-21 MED ORDER — FOLIC ACID 1 MG PO TABS
1.0000 mg | ORAL_TABLET | Freq: Every day | ORAL | Status: DC
  Filled 2016-09-21: qty 1

## 2016-09-21 MED ORDER — METOPROLOL SUCCINATE ER 50 MG PO TB24
50.0000 mg | ORAL_TABLET | Freq: Every day | ORAL | Status: DC
  Administered 2016-09-21 – 2016-09-23 (×3): 50 mg via ORAL
  Filled 2016-09-21 (×3): qty 1

## 2016-09-21 MED ORDER — ADULT MULTIVITAMIN W/MINERALS CH
1.0000 | ORAL_TABLET | Freq: Every day | ORAL | Status: DC
  Filled 2016-09-21: qty 1

## 2016-09-21 MED ORDER — ALBUTEROL SULFATE (2.5 MG/3ML) 0.083% IN NEBU: 2.5000 mg | INHALATION_SOLUTION | RESPIRATORY_TRACT | Status: DC | PRN

## 2016-09-21 MED ORDER — PREDNISONE 2.5 MG PO TABS
7.5000 mg | ORAL_TABLET | Freq: Every day | ORAL | Status: DC
  Administered 2016-09-21 – 2016-09-23 (×3): 7.5 mg via ORAL
  Filled 2016-09-21 (×3): qty 3

## 2016-09-21 MED ORDER — SENNOSIDES-DOCUSATE SODIUM 8.6-50 MG PO TABS: 1.0000 | ORAL_TABLET | Freq: Every evening | ORAL | Status: DC | PRN

## 2016-09-21 MED ORDER — LORAZEPAM 2 MG/ML IJ SOLN: 1.0000 mg | Freq: Four times a day (QID) | INTRAMUSCULAR | Status: DC | PRN

## 2016-09-21 MED ORDER — THIAMINE HCL 100 MG/ML IJ SOLN: 100.0000 mg | Freq: Every day | INTRAMUSCULAR | Status: DC

## 2016-09-21 MED ORDER — AMLODIPINE BESYLATE 10 MG PO TABS
10.0000 mg | ORAL_TABLET | Freq: Every day | ORAL | Status: DC
  Administered 2016-09-21 – 2016-09-23 (×3): 10 mg via ORAL
  Filled 2016-09-21 (×3): qty 1

## 2016-09-21 MED ORDER — SODIUM CHLORIDE 0.9 % IV BOLUS (SEPSIS)
1000.0000 mL | Freq: Once | INTRAVENOUS | Status: AC
  Administered 2016-09-21: 1000 mL via INTRAVENOUS

## 2016-09-21 MED ORDER — POTASSIUM CHLORIDE IN NACL 40-0.9 MEQ/L-% IV SOLN
INTRAVENOUS | Status: DC
Start: 2016-09-21 — End: 2016-09-22
  Administered 2016-09-21 – 2016-09-22 (×3): 100 mL/h via INTRAVENOUS
  Administered 2016-09-22: 75 mL/h via INTRAVENOUS
  Filled 2016-09-21 (×6): qty 1000

## 2016-09-21 MED ORDER — VITAMIN B-1 100 MG PO TABS
100.0000 mg | ORAL_TABLET | Freq: Every day | ORAL | Status: DC
  Administered 2016-09-21: 100 mg via ORAL
  Filled 2016-09-21 (×2): qty 1

## 2016-09-21 MED ORDER — THIAMINE HCL 100 MG/ML IJ SOLN
Freq: Once | INTRAVENOUS | Status: AC
  Administered 2016-09-21: 08:00:00 via INTRAVENOUS
  Filled 2016-09-21: qty 1000

## 2016-09-21 MED ORDER — UMECLIDINIUM-VILANTEROL 62.5-25 MCG/INH IN AEPB
1.0000 | INHALATION_SPRAY | Freq: Every day | RESPIRATORY_TRACT | Status: DC
  Administered 2016-09-21 – 2016-09-23 (×3): 1 via RESPIRATORY_TRACT
  Filled 2016-09-21: qty 14

## 2016-09-21 MED ORDER — LORATADINE 10 MG PO TABS
10.0000 mg | ORAL_TABLET | Freq: Every day | ORAL | Status: DC
  Administered 2016-09-21 – 2016-09-23 (×3): 10 mg via ORAL
  Filled 2016-09-21 (×3): qty 1

## 2016-09-21 MED ORDER — ONDANSETRON HCL 4 MG PO TABS: 4.0000 mg | ORAL_TABLET | Freq: Four times a day (QID) | ORAL | Status: DC | PRN

## 2016-09-21 MED ORDER — POTASSIUM CHLORIDE CRYS ER 20 MEQ PO TBCR
40.0000 meq | EXTENDED_RELEASE_TABLET | Freq: Once | ORAL | Status: AC
  Administered 2016-09-21: 40 meq via ORAL
  Filled 2016-09-21: qty 2

## 2016-09-21 MED ORDER — PANTOPRAZOLE SODIUM 40 MG PO TBEC
40.0000 mg | DELAYED_RELEASE_TABLET | Freq: Every day | ORAL | Status: DC
  Filled 2016-09-21: qty 1

## 2016-09-21 MED ORDER — FOLIC ACID 1 MG PO TABS
1.0000 mg | ORAL_TABLET | Freq: Every day | ORAL | Status: DC
  Administered 2016-09-21 – 2016-09-23 (×3): 1 mg via ORAL
  Filled 2016-09-21 (×3): qty 1

## 2016-09-21 MED ORDER — MORPHINE SULFATE (PF) 4 MG/ML IV SOLN
1.0000 mg | Freq: Once | INTRAVENOUS | Status: AC
  Administered 2016-09-21: 1 mg via INTRAVENOUS
  Filled 2016-09-21: qty 1

## 2016-09-21 MED ORDER — GI COCKTAIL ~~LOC~~
30.0000 mL | Freq: Once | ORAL | Status: AC
  Administered 2016-09-21: 30 mL via ORAL
  Filled 2016-09-21: qty 30

## 2016-09-21 MED ORDER — ASPIRIN EC 81 MG PO TBEC
81.0000 mg | DELAYED_RELEASE_TABLET | Freq: Every day | ORAL | Status: DC
  Administered 2016-09-21 – 2016-09-23 (×3): 81 mg via ORAL
  Filled 2016-09-21 (×3): qty 1

## 2016-09-21 MED ORDER — THIAMINE HCL 100 MG/ML IJ SOLN
Freq: Once | INTRAVENOUS | Status: DC
  Filled 2016-09-21 (×2): qty 1000

## 2016-09-21 MED ORDER — ADULT MULTIVITAMIN W/MINERALS CH
1.0000 | ORAL_TABLET | Freq: Every day | ORAL | Status: DC
  Administered 2016-09-21 – 2016-09-23 (×3): 1 via ORAL
  Filled 2016-09-21 (×2): qty 1

## 2016-09-21 MED ORDER — ATORVASTATIN CALCIUM 20 MG PO TABS
40.0000 mg | ORAL_TABLET | Freq: Every day | ORAL | Status: DC
  Administered 2016-09-21 – 2016-09-22 (×2): 40 mg via ORAL
  Filled 2016-09-21 (×2): qty 2

## 2016-09-21 MED ORDER — CLOPIDOGREL BISULFATE 75 MG PO TABS
75.0000 mg | ORAL_TABLET | Freq: Every day | ORAL | Status: DC
  Administered 2016-09-21 – 2016-09-23 (×3): 75 mg via ORAL
  Filled 2016-09-21 (×3): qty 1

## 2016-09-21 MED ORDER — ACETAMINOPHEN 325 MG PO TABS
650.0000 mg | ORAL_TABLET | Freq: Four times a day (QID) | ORAL | Status: DC | PRN
  Administered 2016-09-21 – 2016-09-22 (×2): 650 mg via ORAL
  Filled 2016-09-21 (×2): qty 2

## 2016-09-21 MED ORDER — PANTOPRAZOLE SODIUM 40 MG IV SOLR
40.0000 mg | Freq: Two times a day (BID) | INTRAVENOUS | Status: DC
  Administered 2016-09-21 – 2016-09-23 (×5): 40 mg via INTRAVENOUS
  Filled 2016-09-21 (×5): qty 40

## 2016-09-21 MED ORDER — ENOXAPARIN SODIUM 40 MG/0.4ML ~~LOC~~ SOLN
40.0000 mg | SUBCUTANEOUS | Status: DC
  Administered 2016-09-21 – 2016-09-22 (×2): 40 mg via SUBCUTANEOUS
  Filled 2016-09-21 (×2): qty 0.4

## 2016-09-21 MED ORDER — VITAMIN B-1 100 MG PO TABS
100.0000 mg | ORAL_TABLET | Freq: Every day | ORAL | Status: DC
  Administered 2016-09-22 – 2016-09-23 (×2): 100 mg via ORAL
  Filled 2016-09-21: qty 1

## 2016-09-21 MED ORDER — ALBUTEROL SULFATE HFA 108 (90 BASE) MCG/ACT IN AERS: 2.0000 | INHALATION_SPRAY | RESPIRATORY_TRACT | Status: DC | PRN

## 2016-09-21 MED ORDER — LORAZEPAM 1 MG PO TABS: 1.0000 mg | ORAL_TABLET | Freq: Four times a day (QID) | ORAL | Status: DC | PRN

## 2016-09-21 MED ORDER — ONDANSETRON HCL 4 MG/2ML IJ SOLN: 4.0000 mg | Freq: Four times a day (QID) | INTRAMUSCULAR | Status: DC | PRN

## 2016-09-21 NOTE — Consult Note (Signed)
MEDICATION RELATED CONSULT NOTE - INITIAL   Pharmacy Consult for electrolytes Indication: hypokalemia, hypomagnesium, hypophos  No Known Allergies  Patient Measurements: Height: 5\' 7"  (170.2 cm) Weight: 181 lb (82.1 kg) IBW/kg (Calculated) : 66.1 Adjusted Body Weight:   Vital Signs: Temp: 98.1 F (36.7 C) (12/04 0440) Temp Source: Oral (12/04 0440) BP: 146/86 (12/04 0440) Pulse Rate: 92 (12/04 0440) Intake/Output from previous day: No intake/output data recorded. Intake/Output from this shift: No intake/output data recorded.  Labs:  Recent Labs  09/21/16 0446  WBC 8.9  HGB 12.5*  HCT 34.6*  PLT 217  CREATININE 0.71  MG 0.5*  PHOS 2.4*  ALBUMIN 4.5  PROT 6.9  AST 88*  ALT 46  ALKPHOS 26*  BILITOT 1.1   Estimated Creatinine Clearance: 100.7 mL/min (by C-G formula based on SCr of 0.71 mg/dL).   Microbiology: No results found for this or any previous visit (from the past 720 hour(s)).  Medical History: Past Medical History:  Diagnosis Date  . COPD (chronic obstructive pulmonary disease) ([place])   . Hyperlipidemia   . Hypertension   . Pulmonary fibrosis ([place]) 11/2015    Medications:  Scheduled:    Assessment: Pt is a 60 year old male who presents with chest pain and vomiting. Pt admits to drinking 6 beers/day. Pt found to have a K of <2, Mg=0.5, phos =2.4 Na=115 and Ca=6.8  Goal of Therapy:  Normalization of electrolytes  Plan:  Banana bag with 80 MEQ of KCL and 4g of Mg added @ 171ml/hr. Will replace calcium and phosphorous once magnesium is normalized.  Recheck Mg and BMP 1 hr after banana bag finishes infusing.  Continue to replace as needed Thank you for the consult. Pharmacy to continue to monitor.  Ramond Dial, Pharm.D, BCPS Clinical Pharmacist  09/21/2016,7:24 AM

## 2016-09-21 NOTE — Consult Note (Signed)
MEDICATION RELATED CONSULT NOTE -Follow Up  Pharmacy Consult for electrolytes Indication: hypokalemia, hypomagnesium, hypophos  No Known Allergies  Patient Measurements: Height: 5\' 7"  (170.2 cm) Weight: 173 lb 8 oz (78.7 kg) IBW/kg (Calculated) : 66.1 Adjusted Body Weight:   Vital Signs: Temp: 99 F (37.2 C) (12/04 1156) Temp Source: Oral (12/04 1156) BP: 138/80 (12/04 1500) Pulse Rate: 91 (12/04 1500) Intake/Output from previous day: No intake/output data recorded. Intake/Output from this shift: Total I/O In: -  Out: 400 [Urine:400]  Labs:  Recent Labs  09/21/16 0446 09/21/16 1723  WBC 8.9  --   HGB 12.5*  --   HCT 34.6*  --   PLT 217  --   CREATININE 0.71 0.70  MG 0.5* 1.6*  PHOS 2.4*  --   ALBUMIN 4.5  --   PROT 6.9  --   AST 88*  --   ALT 46  --   ALKPHOS 26*  --   BILITOT 1.1  --    Estimated Creatinine Clearance: 91.8 mL/min (by C-G formula based on SCr of 0.7 mg/dL).   Microbiology: No results found for this or any previous visit (from the past 720 hour(s)).  Medical History: Past Medical History:  Diagnosis Date  . COPD (chronic obstructive pulmonary disease) ([place])   . Hyperlipidemia   . Hypertension   . Pulmonary fibrosis ([place]) 11/2015    Medications:  Scheduled:  . amLODipine  10 mg Oral Daily  . aspirin EC  81 mg Oral Daily  . atorvastatin  40 mg Oral QHS  . clopidogrel  75 mg Oral Daily  . enoxaparin (LOVENOX) injection  40 mg Subcutaneous Q24H  . folic acid  1 mg Oral Daily  . folic acid  1 mg Oral Daily  . loratadine  10 mg Oral Daily  . magnesium sulfate 1 - 4 g bolus IVPB  2 g Intravenous Once  . metoprolol succinate  50 mg Oral Daily  . multivitamin with minerals  1 tablet Oral Daily  . multivitamin with minerals  1 tablet Oral Daily  . pantoprazole (PROTONIX) IV  40 mg Intravenous BID  . potassium chloride  40 mEq Oral Once  . predniSONE  7.5 mg Oral Daily  . thiamine  100 mg Oral Daily   Or  . thiamine  100 mg  Intravenous Daily  . thiamine  100 mg Oral Daily  . umeclidinium-vilanterol  1 puff Inhalation Daily    Assessment: Pt is a 60 year old male who presents with chest pain and vomiting. Pt admits to drinking 6 beers/day. Pt found to have a K of <2, Mg=0.5, phos =2.4 Na=115 and Ca=6.8  Goal of Therapy:  Normalization of electrolytes  Plan:  Banana bag with 80 MEQ of KCL and 4g of Mg added @ 132ml/hr. Will replace calcium and phosphorous once magnesium is normalized.  Recheck Mg and BMP 1 hr after banana bag finishes infusing.  Continue to replace as needed  12/4 at 17:23  K = 3.1, Mag = 1.6.  Ordered Mag 2g IV bolus and KCl 43meq PO x 1 dose.  Will recheck electrolytes with am labs.    Olivia Canter, [facility] Clinical Pharmacist 09/21/2016,8:00 PM

## 2016-09-21 NOTE — Consult Note (Signed)
[name] MD  [address] [place], [place] [address] Phone: [phone] Fax : [phone]  Consultation  Referring Provider:     No ref. provider found Primary Care Physician:  Lavonne Chick, MD Primary Gastroenterologist:  Dr. Vicente Males         Reason for Consultation:     Abdominal pain   Date of Admission:  09/21/2016 Date of Consultation:  09/21/2016         HPI:   Austin Patterson is a 60 y.o. male with history of alcohol abuse , CAD s/p stent in 2010 , pulmonary fibrosis on home oxygen presented to the ER with abdominal pain , chest tightness and coffee ground emesis.   He says he has had non radiating , continuous epigastric pain for the last few days, drinks 12 pack a day for "many years" not been eating much. He developed nausea/vomiting which was initially clear but later was coffee ground in color. Initially he had some diarrhea which has resolved and denies passage of tarry black stool. Denies any NSAID use.Last episode of vomiting was this morning . Presently not in any pain or distress. He is on plavix.   Past Medical History:  Diagnosis Date  . COPD (chronic obstructive pulmonary disease) ([place])   . Hyperlipidemia   . Hypertension   . Pulmonary fibrosis ([place]) 11/2015    Past Surgical History:  Procedure Laterality Date  . CORONARY STENT PLACEMENT    . SHOULDER ACROMIOPLASTY      Prior to Admission medications   Medication Sig Start Date End Date Taking? Authorizing Provider  albuterol (PROVENTIL HFA;VENTOLIN HFA) 108 (90 Base) MCG/ACT inhaler Inhale 2 puffs into the lungs every 4 (four) hours as needed for wheezing or shortness of breath. 08/19/16  Yes Tawni Millers, MD  amLODipine (NORVASC) 10 MG tablet Take 1 tablet (10 mg total) by mouth daily. 08/19/16  Yes Tawni Millers, MD  aspirin EC 81 MG tablet Take 1 tablet (81 mg total) by mouth daily. 08/19/16  Yes Tawni Millers, MD  atorvastatin (LIPITOR) 40 MG tablet Take 1 tablet (40 mg total) by mouth at bedtime.  08/19/16  Yes Tawni Millers, MD  cetirizine (ZYRTEC) 10 MG tablet Take 10 mg by mouth at bedtime. 08/19/16  Yes Historical Provider, MD  clopidogrel (PLAVIX) 75 MG tablet Take 1 tablet (75 mg total) by mouth daily. 08/19/16  Yes Tawni Millers, MD  losartan-hydrochlorothiazide (HYZAAR) 100-12.5 MG tablet Take 1 tablet by mouth daily. 08/19/16  Yes Tawni Millers, MD  metoprolol succinate (TOPROL-XL) 50 MG 24 hr tablet Take 1 tablet (50 mg total) by mouth daily. 08/19/16  Yes Tawni Millers, MD  pantoprazole (PROTONIX) 40 MG tablet Take 1 tablet (40 mg total) by mouth daily. 08/19/16  Yes Tawni Millers, MD  predniSONE (DELTASONE) 5 MG tablet Take 1.5 tablet daily Patient taking differently: Take 7.5 mg by mouth daily. Take 1.5 tablet daily 08/31/16  Yes Laverle Hobby, MD  umeclidinium-vilanterol [facility] ELLIPTA) 62.5-25 MCG/INH AEPB INHALE ONE PUFF INTO THE LUNGS DAILY 08/19/16  Yes Tawni Millers, MD    Family History  Problem Relation Age of Onset  . Heart disease Mother      Social History  Substance Use Topics  . Smoking status: Former Smoker    Quit date: 10/14/2015  . Smokeless tobacco: Former Systems developer  . Alcohol use 16.8 oz/week    28 Cans of beer per week     Comment: 6 12  oz cans of beer/day    Allergies as of 09/21/2016  . (No Known Allergies)    Review of Systems:    All systems reviewed and negative except where noted in HPI.   Physical Exam:  Vital signs in last 24 hours: Temp:  [98.1 F (36.7 C)-99 F (37.2 C)] 99 F (37.2 C) (12/04 1156) Pulse Rate:  [85-112] 98 (12/04 1156) Resp:  [15-28] 18 (12/04 1156) BP: (127-162)/(73-109) 127/73 (12/04 1156) SpO2:  [91 %-100 %] 91 % (12/04 1156) Weight:  [173 lb 8 oz (78.7 kg)-181 lb (82.1 kg)] 173 lb 8 oz (78.7 kg) (12/04 0906) Last BM Date: 09/21/16 General:   Pleasant, cooperative in NAD Head:  Normocephalic and atraumatic. Eyes:   No icterus.   Conjunctiva pink. PERRLA. Ears:  Normal auditory acuity. Neck:  Supple; no  masses or thyroidomegaly Lungs: Respirations even and unlabored. Lungs clear to auscultation bilaterally.   No wheezes, crackles, or rhonchi.  Heart:  Regular rate and rhythm;  Without murmur, clicks, rubs or gallops Abdomen:  Soft, nondistended, nontender. Normal bowel sounds. No appreciable masses or hepatomegaly.  No rebound or guarding.  Rectal:  Not performed. Msk:  Symmetrical without gross deformities.  Cervical Nodes:  No significant cervical adenopathy. Psych:  Alert and cooperative. Normal affect.  LAB RESULTS:  Recent Labs  09/21/16 0446  WBC 8.9  HGB 12.5*  HCT 34.6*  PLT 217   BMET  Recent Labs  09/21/16 0446  NA 115*  K <2.0*  CL 72*  CO2 21*  GLUCOSE 109*  BUN 11  CREATININE 0.71  CALCIUM 6.8*   LFT  Recent Labs  09/21/16 0446  PROT 6.9  ALBUMIN 4.5  AST 88*  ALT 46  ALKPHOS 26*  BILITOT 1.1   PT/INR No results for input(s): LABPROT, INR in the last 72 hours.  STUDIES: No results found.    Impression / Plan:   Austin Patterson is a 60 y.o. y/o male with history of alcohol abuse admitted with abdominal pain , coffee ground emesis. Hb stable. Multiple electrolytes are very low , could be due to insufficient intake/malnutrition  +/- vomiting .  He is on plavix, PPI,Asprin . Presently no overt signs of bleeding . History is suggestive of possible mallory weiss tear +/- gastritis.   Plan  1. Replace electrolytes, monitor phosphorus and magnesium while refeeding to avoid refeeding syndrome.On IV vitamins.  2. PPI 3. Monitor CBC. 4. He is being ruled out for a cardiac event and if negative,depending on his Hb may need to hold plavix  5. We can consider an EGD at some point when his electrolytes are normalized .   Thank you for involving me in the care of this patient.      LOS: 0 days   [name], MD  09/21/2016, 12:13 PM

## 2016-09-21 NOTE — ED Triage Notes (Signed)
Pt reports having chronic chest pain with an increase in pain tonight.  Patient also reports having "black" vomit that started last night.  Pt reports being on plavix.  Pt denies and black or bright red blood in stools, but states that he has chronic diarrhea.  Pt belching frequently in triage.  Pt on chronic 4LNC and appears in NAD at this time.

## 2016-09-21 NOTE — ED Notes (Signed)
CRITICAL LAB: Magnesium is 0.5, [facility], Dr. Dahlia Client notified, orders recieved

## 2016-09-21 NOTE — ED Provider Notes (Signed)
[facility] Emergency Department Provider Note   ____________________________________________   First MD Initiated Contact with Patient 09/21/16 [phone]     (approximate)  I have reviewed the triage vital signs and the nursing notes.   HISTORY  Chief Complaint Chest Pain and Hematemesis    HPI Austin Patterson is a 60 y.o. male who comes into the hospital today with some vomiting and chest pain. The patient reports that he has had chest pain for several days in the middle of his chest. He reports that the vomiting started last night. He's vomited about 2-4 times but reports that the last episode was clear in appearance. He reports that it did have some black emesis earlier. The patient denies abdominal pain initially but then states he has some pain on his sides of his abdomen. The patient denies any headache, dizziness, sweats or shortness of breath. The patient reports that he does drink 6 beers daily. The patient reports that he was uncomfortable and he's had a stent before so he did note please having heart attacks he decided to come into the hospital to get checked out. The patient is here for evaluation. He has no headache, lightheadedness, blurred vision, problems with urination or bowel movements.   Past Medical History:  Diagnosis Date  . COPD (chronic obstructive pulmonary disease) ([place])   . Hyperlipidemia   . Hypertension   . Pulmonary fibrosis ([place]) 11/2015    Patient Active Problem List   Diagnosis Date Noted  . Pneumonia 12/09/2015  . COPD with acute exacerbation ([place]) 10/14/2015    Past Surgical History:  Procedure Laterality Date  . CORONARY STENT PLACEMENT    . SHOULDER ACROMIOPLASTY      Prior to Admission medications   Medication Sig Start Date End Date Taking? Authorizing Provider  albuterol (PROVENTIL HFA;VENTOLIN HFA) 108 (90 Base) MCG/ACT inhaler Inhale 2 puffs into the lungs every 4 (four) hours as needed for wheezing or  shortness of breath. 08/19/16  Yes Tawni Millers, MD  amLODipine (NORVASC) 10 MG tablet Take 1 tablet (10 mg total) by mouth daily. 08/19/16  Yes Tawni Millers, MD  aspirin EC 81 MG tablet Take 1 tablet (81 mg total) by mouth daily. 08/19/16  Yes Tawni Millers, MD  atorvastatin (LIPITOR) 40 MG tablet Take 1 tablet (40 mg total) by mouth at bedtime. 08/19/16  Yes Tawni Millers, MD  cetirizine (ZYRTEC) 10 MG tablet Take 10 mg by mouth at bedtime. 08/19/16  Yes Historical Provider, MD  clopidogrel (PLAVIX) 75 MG tablet Take 1 tablet (75 mg total) by mouth daily. 08/19/16  Yes Tawni Millers, MD  losartan-hydrochlorothiazide (HYZAAR) 100-12.5 MG tablet Take 1 tablet by mouth daily. 08/19/16  Yes Tawni Millers, MD  metoprolol succinate (TOPROL-XL) 50 MG 24 hr tablet Take 1 tablet (50 mg total) by mouth daily. 08/19/16  Yes Tawni Millers, MD  pantoprazole (PROTONIX) 40 MG tablet Take 1 tablet (40 mg total) by mouth daily. 08/19/16  Yes Tawni Millers, MD  predniSONE (DELTASONE) 5 MG tablet Take 1.5 tablet daily Patient taking differently: Take 7.5 mg by mouth daily. Take 1.5 tablet daily 08/31/16  Yes Laverle Hobby, MD  umeclidinium-vilanterol [facility] ELLIPTA) 62.5-25 MCG/INH AEPB INHALE ONE PUFF INTO THE LUNGS DAILY 08/19/16  Yes Tawni Millers, MD  predniSONE (DELTASONE) 10 MG tablet TAKE 1 TABLET BY MOUTH DAILY Patient not taking: Reported on 09/21/2016 08/19/16   Tawni Millers, MD    Allergies  Patient has no known allergies.  Family History  Problem Relation Age of Onset  . Heart disease Mother     Social History Social History  Substance Use Topics  . Smoking status: Former Smoker    Quit date: 10/14/2015  . Smokeless tobacco: Former Systems developer  . Alcohol use 16.8 oz/week    28 Cans of beer per week     Comment: 6 12 oz cans of beer/day    Review of Systems Constitutional: No fever/chills Eyes: No visual changes. ENT: No sore throat. Cardiovascular:  chest pain. Respiratory: Denies shortness  of breath. Gastrointestinal: abdominal pain.  nausea, vomiting.  No diarrhea.  No constipation. Genitourinary: Negative for dysuria. Musculoskeletal: Negative for back pain. Skin: Negative for rash. Neurological: Negative for headaches, focal weakness or numbness.  10-point ROS otherwise negative.  ____________________________________________   PHYSICAL EXAM:  VITAL SIGNS: ED Triage Vitals  Enc Vitals Group     BP 09/21/16 0440 (!) 146/86     Pulse Rate 09/21/16 0440 92     Resp 09/21/16 0440 16     Temp 09/21/16 0440 98.1 F (36.7 C)     Temp Source 09/21/16 0440 Oral     SpO2 09/21/16 0440 99 %     Weight 09/21/16 0446 181 lb (82.1 kg)     Height 09/21/16 0446 5\' 7"  (1.702 m)     Head Circumference --      Peak Flow --      Pain Score 09/21/16 0446 9     Pain Loc --      Pain Edu? --      Excl. in [place]? --     Constitutional: Alert and oriented. Well appearing and in Moderate distress. Eyes: Conjunctivae are normal. PERRL. EOMI. Head: Atraumatic. Nose: No congestion/rhinnorhea. Mouth/Throat: Mucous membranes are moist.  Oropharynx non-erythematous. Cardiovascular: Normal rate, regular rhythm. Grossly normal heart sounds.  Good peripheral circulation. Respiratory: Normal respiratory effort.  No retractions. Lungs CTAB. Gastrointestinal: Soft and nontender. No distention.  Musculoskeletal: No lower extremity tenderness nor edema.   Neurologic:  Normal speech and language.  Skin:  Skin is warm, dry and intact.  Psychiatric: Mood and affect are normal.   ____________________________________________   LABS (all labs ordered are listed, but only abnormal results are displayed)  Labs Reviewed  COMPREHENSIVE METABOLIC PANEL - Abnormal; Notable for the following:       Result Value   Sodium 115 (*)    Potassium <2.0 (*)    Chloride 72 (*)    CO2 21 (*)    Glucose, Bld 109 (*)    Calcium 6.8 (*)    AST 88 (*)    Alkaline Phosphatase 26 (*)    Anion gap 22 (*)     All other components within normal limits  CBC - Abnormal; Notable for the following:    RBC 3.24 (*)    Hemoglobin 12.5 (*)    HCT 34.6 (*)    MCV 106.7 (*)    MCH 38.6 (*)    MCHC 36.2 (*)    RDW 15.2 (*)    All other components within normal limits  MAGNESIUM - Abnormal; Notable for the following:    Magnesium 0.5 (*)    All other components within normal limits  PHOSPHORUS - Abnormal; Notable for the following:    Phosphorus 2.4 (*)    All other components within normal limits  TROPONIN I  LIPASE, BLOOD  POC OCCULT BLOOD, ED   ____________________________________________  EKG  ED ECG REPORT I, Loney Hering, the attending physician, personally viewed and interpreted this ECG.   Date: 09/21/2016  EKG Time: 440  Rate: 90  Rhythm: normal sinus rhythm  Axis: normal  Intervals:none  ST&T Change: none  ____________________________________________  RADIOLOGY  none ____________________________________________   PROCEDURES  Procedure(s) performed: None  Procedures  Critical Care performed: No  ____________________________________________   INITIAL IMPRESSION / ASSESSMENT AND PLAN / ED COURSE  Pertinent labs & imaging results that were available during my care of the patient were reviewed by me and considered in my medical decision making (see chart for details).  This is a 60 year old male who comes into the hospital today with vomiting and chest pain. We did receive the results of the patient's blood work and it appears that the patient has some beer potomania. The patient is hyponatremic, hypokalemic, hypochloremic, hypocalcemic, hypomagnesemic. The patient likely has a symptoms due to his alcohol intake. I will give the patient a banana bag with thiamine, folate, potassium and magnesium. I will also give the patient a liter of normal saline. He does not have an abnormal lipase. I will admit the patient to the hospitalist service for further evaluation.  The patient is comfortable at this time and in no acute distress.  Clinical Course      ____________________________________________   FINAL CLINICAL IMPRESSION(S) / ED DIAGNOSES  Final diagnoses:  Hyponatremia  Hypomagnesemia  Chest pain, unspecified type  Hypokalemia      NEW MEDICATIONS STARTED DURING THIS VISIT:  New Prescriptions   No medications on file     Note:  This document was prepared using Dragon voice recognition software and may include unintentional dictation errors.    Loney Hering, MD 09/21/16 [phone]

## 2016-09-21 NOTE — ED Notes (Signed)
Pt vomited clear liquid in lobby; lab just called to report abnormal lab values; pt taken to treatment room by Corky Sing, RN

## 2016-09-21 NOTE — H&P (Signed)
[facility] NAME: Austin Patterson    MR#:  [id number]  DATE OF BIRTH:  25-Apr-1956  DATE OF ADMISSION:  09/21/2016  PRIMARY CARE PHYSICIAN: Lavonne Chick, MD   REQUESTING/REFERRING PHYSICIAN: Dr. Dahlia Client  CHIEF COMPLAINT:   Chest pain and epigastric pain for 1 week coffee-ground emesis HISTORY OF PRESENT ILLNESS:  Austin Patterson  is a 60 y.o. male with a known history of Chronic alcohol use, COPD, hypertension, coronary artery disease status post stent 1 in 2010 and history of pulmonary fibrosis chronic follows with pulmonologist and on chronic home oxygen comes in with complaints of coffee-ground emesis and epigastric discomfort along with chest tightness for 1 week. Patient has been drinking some alcohol he drinks 6 beers all yesterday. His ethanol level is 170. He is being admitted for nausea vomiting and severe electrolyte abnormality with hyponatremia hypomagnesemia hypophosphatemia hypochloremia and hypokalemia EKG does not show acute changes. Troponin is 0.03.  PAST MEDICAL HISTORY:   Past Medical History:  Diagnosis Date  . COPD (chronic obstructive pulmonary disease) ([place])   . Hyperlipidemia   . Hypertension   . Pulmonary fibrosis ([place]) 11/2015    PAST SURGICAL HISTOIRY:   Past Surgical History:  Procedure Laterality Date  . CORONARY STENT PLACEMENT    . SHOULDER ACROMIOPLASTY      SOCIAL HISTORY:   Social History  Substance Use Topics  . Smoking status: Former Smoker    Quit date: 10/14/2015  . Smokeless tobacco: Former Systems developer  . Alcohol use 16.8 oz/week    28 Cans of beer per week     Comment: 6 12 oz cans of beer/day    FAMILY HISTORY:   Family History  Problem Relation Age of Onset  . Heart disease Mother     DRUG ALLERGIES:  No Known Allergies  REVIEW OF SYSTEMS:  Review of Systems  Constitutional: Negative for chills, fever and weight loss.  HENT: Negative for ear discharge, ear  pain and nosebleeds.   Eyes: Negative for blurred vision, pain and discharge.  Respiratory: Negative for sputum production, shortness of breath, wheezing and stridor.   Cardiovascular: Negative for chest pain, palpitations, orthopnea and PND.  Gastrointestinal: Positive for abdominal pain, nausea and vomiting. Negative for diarrhea.  Genitourinary: Negative for frequency and urgency.  Musculoskeletal: Negative for back pain and joint pain.  Neurological: Positive for weakness. Negative for sensory change, speech change and focal weakness.  Psychiatric/Behavioral: Negative for depression and hallucinations. The patient is not nervous/anxious.      MEDICATIONS AT HOME:   Prior to Admission medications   Medication Sig Start Date End Date Taking? Authorizing Provider  albuterol (PROVENTIL HFA;VENTOLIN HFA) 108 (90 Base) MCG/ACT inhaler Inhale 2 puffs into the lungs every 4 (four) hours as needed for wheezing or shortness of breath. 08/19/16  Yes Tawni Millers, MD  amLODipine (NORVASC) 10 MG tablet Take 1 tablet (10 mg total) by mouth daily. 08/19/16  Yes Tawni Millers, MD  aspirin EC 81 MG tablet Take 1 tablet (81 mg total) by mouth daily. 08/19/16  Yes Tawni Millers, MD  atorvastatin (LIPITOR) 40 MG tablet Take 1 tablet (40 mg total) by mouth at bedtime. 08/19/16  Yes Tawni Millers, MD  cetirizine (ZYRTEC) 10 MG tablet Take 10 mg by mouth at bedtime. 08/19/16  Yes Historical Provider, MD  clopidogrel (PLAVIX) 75 MG tablet Take 1 tablet (75 mg total) by mouth daily. 08/19/16  Yes Dianah Field  Chaplin, MD  losartan-hydrochlorothiazide (HYZAAR) 100-12.5 MG tablet Take 1 tablet by mouth daily. 08/19/16  Yes Tawni Millers, MD  metoprolol succinate (TOPROL-XL) 50 MG 24 hr tablet Take 1 tablet (50 mg total) by mouth daily. 08/19/16  Yes Tawni Millers, MD  pantoprazole (PROTONIX) 40 MG tablet Take 1 tablet (40 mg total) by mouth daily. 08/19/16  Yes Tawni Millers, MD  predniSONE (DELTASONE) 5 MG tablet Take 1.5 tablet  daily Patient taking differently: Take 7.5 mg by mouth daily. Take 1.5 tablet daily 08/31/16  Yes Laverle Hobby, MD  umeclidinium-vilanterol (ANORO ELLIPTA) 62.5-25 MCG/INH AEPB INHALE ONE PUFF INTO THE LUNGS DAILY 08/19/16  Yes Tawni Millers, MD      VITAL SIGNS:  Blood pressure (!) 142/76, pulse 97, temperature 98.4 F (36.9 C), temperature source Oral, resp. rate 18, height 5\' 7"  (1.702 m), weight 78.7 kg (173 lb 8 oz), SpO2 99 %.  PHYSICAL EXAMINATION:  GENERAL:  60 y.o.-year-old patient lying in the bed with no acute distress.  EYES: Pupils equal, round, reactive to light and accommodation. No scleral icterus. Extraocular muscles intact.  HEENT: Head atraumatic, normocephalic. Oropharynx and nasopharynx clear.  NECK:  Supple, no jugular venous distention. No thyroid enlargement, no tenderness.  LUNGS: Normal breath sounds bilaterally, no wheezing, rales,rhonchi or crepitation. No use of accessory muscles of respiration.  CARDIOVASCULAR: S1, S2 normal. No murmurs, rubs, or gallops.  ABDOMEN: Soft, nontender, nondistended. Bowel sounds present. No organomegaly or mass.  EXTREMITIES: No pedal edema, cyanosis, or clubbing.  NEUROLOGIC: Cranial nerves II through XII are intact. Muscle strength 5/5 in all extremities. Sensation intact. Gait not checked.  PSYCHIATRIC: The patient is alert and oriented x 3.  SKIN: No obvious rash, lesion, or ulcer.   LABORATORY PANEL:   CBC  Recent Labs Lab 09/21/16 0446  WBC 8.9  HGB 12.5*  HCT 34.6*  PLT 217   ------------------------------------------------------------------------------------------------------------------  Chemistries   Recent Labs Lab 09/21/16 0446  NA 115*  K <2.0*  CL 72*  CO2 21*  GLUCOSE 109*  BUN 11  CREATININE 0.71  CALCIUM 6.8*  MG 0.5*  AST 88*  ALT 46  ALKPHOS 26*  BILITOT 1.1    ------------------------------------------------------------------------------------------------------------------  Cardiac Enzymes  Recent Labs Lab 09/21/16 0446  TROPONINI <0.03   ------------------------------------------------------------------------------------------------------------------  RADIOLOGY:  No results found.  EKG:  Normal sinus rhythm/sinus tach heart rate 90s  IMPRESSION AND PLAN:   Austin Patterson  is a 60 y.o. male with a known history of Chronic alcohol use, COPD, hypertension, coronary artery disease status post stent 1 in 2010 and history of pulmonary fibrosis chronic follows with pulmonologist and on chronic home oxygen comes in with complaints of coffee-ground emesis and epigastric discomfort along with chest tightness for 1 week.  1. Acute on chronic epigastric pain with chest tightness and history of nausea vomiting with coffee-ground emesis along with alcohol abuse suspect alcohol induced gastritis -Admit to medical floor -IV fluids -IV Protonix twice a day -Monitor hemoglobin -Nothing by mouth except sips of water and ice -GI consultation discussed with Dr. Allen Norris  2. Hypokalemia hyponatremia hypomagnesemia hypophosphatemia hypochloremia suspect due to alcohol abuse and poor by mouth intake -Pharmacy consult for electrolyte replacement  3. Coronary artery disease status post stent in 2010 -Cardiac enzymes negative. EKG does not show any changes -Spoke with Dr. Clayborn Bigness to see patient  4. Idiopathic pulmonary fibrosis on chronic home oxygen and chronic steroids -Continue meds and oxygen along with nebulizer if needed  5.  Hypertension resume home meds  6. DVT prophylaxis SCDs and teds. I'll hold off antiplatelet agent with coffee-ground emesis  No family members present. Case was discussed with GI and cardiology  All the records are reviewed and case discussed with ED provider. Management plans discussed with the patient, family and they  are in agreement.  CODE STATUS: full  TOTAL TIME TAKING CARE OF THIS PATIENT: 50 minutes.    Austin Patterson M.D on 09/21/2016 at 11:37 AM  Between 7am to 6pm - Pager - [phone]  After 6pm go to [email or web address] - password [email or web address] [place] [facility]  Office  [phone]  CC: Primary care physician; Lavonne Chick, MD

## 2016-09-21 NOTE — Consult Note (Signed)
Reason for Consult: Elevated troponin chest pain coronary disease Referring Physician: Dr Fritzi Mandes hospitalist  Austin Patterson is an 60 y.o. male.  HPI: Patient is a 60 year old male known coronary disease chronic hypoxemia probably fibrosis shortness of breath COPD previous history of smoking who is on home O2 because of hypoxemia. Patient complains of chest pain and anginal symptoms recently. Patient also complained of GERD symptoms possible reflux. Reportedly patient's had evidence of anemia. Patient is followed by pulmonary for pulmonary fibrosis.. Patient has history of alcohol abuse now with severe electrolyte abnormalities complaining of chest pain suggestive of possible angina. Patient is preop for EGD and colonoscopy appears to be acceptable risks once his electrolytes are corrected. Recommend treatment for possible withdrawal symptoms.  Past Medical History:  Diagnosis Date  . COPD (chronic obstructive pulmonary disease) ([place])   . Hyperlipidemia   . Hypertension   . Pulmonary fibrosis ([place]) 11/2015    Past Surgical History:  Procedure Laterality Date  . CORONARY STENT PLACEMENT    . SHOULDER ACROMIOPLASTY      Family History  Problem Relation Age of Onset  . Heart disease Mother     Social History:  reports that he quit smoking about 11 months ago. He has quit using smokeless tobacco. He reports that he drinks about 16.8 oz of alcohol per week . He reports that he does not use drugs.  Allergies: No Known Allergies  Medications: I have reviewed the patient's current medications.  Results for orders placed or performed during the hospital encounter of 09/21/16 (from the past 48 hour(s))  Comprehensive metabolic panel     Status: Abnormal   Collection Time: 09/21/16  4:46 AM  Result Value Ref Range   Sodium 115 (LL) 135 - 145 mmol/L    Comment: CRITICAL RESULT CALLED TO, READ BACK BY AND VERIFIED WITH IRIS GUIDRY AT 8938 09/21/16.PMH ELECTROLYTES REPEATED.PMH    Potassium <2.0 (LL) 3.5 - 5.1 mmol/L    Comment: CRITICAL RESULT CALLED TO, READ BACK BY AND VERIFIED WITH IRIS GUIDRY AT 1017 09/21/16.PMH   Chloride 72 (L) 101 - 111 mmol/L   CO2 21 (L) 22 - 32 mmol/L   Glucose, Bld 109 (H) 65 - 99 mg/dL   BUN 11 6 - 20 mg/dL   Creatinine, Ser 0.71 0.61 - 1.24 mg/dL   Calcium 6.8 (L) 8.9 - 10.3 mg/dL   Total Protein 6.9 6.5 - 8.1 g/dL   Albumin 4.5 3.5 - 5.0 g/dL   AST 88 (H) 15 - 41 U/L   ALT 46 17 - 63 U/L   Alkaline Phosphatase 26 (L) 38 - 126 U/L   Total Bilirubin 1.1 0.3 - 1.2 mg/dL   GFR calc non Af Amer >60 >60 mL/min   GFR calc Af Amer >60 >60 mL/min    Comment: (NOTE) The eGFR has been calculated using the CKD EPI equation. This calculation has not been validated in all clinical situations. eGFR's persistently <60 mL/min signify possible Chronic Kidney Disease.    Anion gap 22 (H) 5 - 15  CBC     Status: Abnormal   Collection Time: 09/21/16  4:46 AM  Result Value Ref Range   WBC 8.9 3.8 - 10.6 K/uL   RBC 3.24 (L) 4.40 - 5.90 MIL/uL   Hemoglobin 12.5 (L) 13.0 - 18.0 g/dL   HCT 34.6 (L) 40.0 - 52.0 %   MCV 106.7 (H) 80.0 - 100.0 fL   MCH 38.6 (H) 26.0 - 34.0 pg   MCHC  36.2 (H) 32.0 - 36.0 g/dL   RDW 15.2 (H) 11.5 - 14.5 %   Platelets 217 150 - 440 K/uL  Troponin I     Status: None   Collection Time: 09/21/16  4:46 AM  Result Value Ref Range   Troponin I <0.03 <0.03 ng/mL  Magnesium     Status: Abnormal   Collection Time: 09/21/16  4:46 AM  Result Value Ref Range   Magnesium 0.5 (LL) 1.7 - 2.4 mg/dL    Comment: CRITICAL RESULT CALLED TO, READ BACK BY AND VERIFIED WITH NOEL WEBSTER AT 0600 09/21/16.PMH  Phosphorus     Status: Abnormal   Collection Time: 09/21/16  4:46 AM  Result Value Ref Range   Phosphorus 2.4 (L) 2.5 - 4.6 mg/dL  Lipase, blood     Status: None   Collection Time: 09/21/16  4:46 AM  Result Value Ref Range   Lipase 37 11 - 51 U/L  Ethanol     Status: Abnormal   Collection Time: 09/21/16  4:46 AM  Result Value  Ref Range   Alcohol, Ethyl (B) 170 (H) <5 mg/dL    Comment:        LOWEST DETECTABLE LIMIT FOR SERUM ALCOHOL IS 5 mg/dL FOR MEDICAL PURPOSES ONLY     No results found.  Review of Systems  Constitutional: Positive for malaise/fatigue and weight loss.  HENT: Positive for congestion.   Eyes: Negative.   Respiratory: Positive for cough and shortness of breath.   Cardiovascular: Positive for chest pain, palpitations, orthopnea and PND.  Gastrointestinal: Positive for heartburn.  Genitourinary: Negative.   Musculoskeletal: Negative.   Skin: Negative.   Neurological: Positive for weakness.  Endo/Heme/Allergies: Negative.   Psychiatric/Behavioral: Positive for substance abuse.   Blood pressure 127/73, pulse 98, temperature 99 F (37.2 C), temperature source Oral, resp. rate 18, height '5\' 7"'  (1.702 m), weight 78.7 kg (173 lb 8 oz), SpO2 91 %. Physical Exam  Nursing note and vitals reviewed. Constitutional: He appears well-developed and well-nourished.  HENT:  Head: Normocephalic and atraumatic.  Eyes: Conjunctivae and EOM are normal. Pupils are equal, round, and reactive to light.  Neck: Normal range of motion. Neck supple.  Cardiovascular: Normal rate and regular rhythm.   Murmur heard. Respiratory: He has decreased breath sounds in the right upper field, the right middle field, the right lower field, the left upper field, the left middle field and the left lower field. He has rhonchi in the right upper field, the right middle field, the right lower field, the left upper field, the left middle field and the left lower field.    Assessment/Plan: Angina Chest pain Coronary artery disease History of present stent 2010 Obesity Hyponatremia Hypokalemia Pulmonary fibrosis Elevated troponin COPD Pneumonia GERD Hypoxemia . PLAN Agree with admission Continue telemetry Follow-up EKGs and troponins Continue Lipitor for hyperlipidemia Continue supplemental oxygen therapy Agree  with inhalers therapy for shortness of breath and COPD Continue Plavix aspirin therapy Maintain blood pressure control Continue DVT prophylaxis Advice patient to refrain from alcohol abuse Continue Protonix for GERD symptoms Acceptable risk for EGD and colonoscopy procedure  Dwayne D Callwood 09/21/2016, 1:44 PM

## 2016-09-22 DIAGNOSIS — K922 Gastrointestinal hemorrhage, unspecified: Secondary | ICD-10-CM

## 2016-09-22 LAB — BASIC METABOLIC PANEL
ANION GAP: 6 (ref 5–15)
Anion gap: 7 (ref 5–15)
BUN: 7 mg/dL (ref 6–20)
BUN: 5 mg/dL — ABNORMAL LOW (ref 6–20)
CALCIUM: 7.3 mg/dL — AB (ref 8.9–10.3)
CHLORIDE: 98 mmol/L — AB (ref 101–111)
CO2: 29 mmol/L (ref 22–32)
CO2: 27 mmol/L (ref 22–32)
CREATININE: 0.6 mg/dL — AB (ref 0.61–1.24)
Calcium: 6.9 mg/dL — ABNORMAL LOW (ref 8.9–10.3)
Chloride: 98 mmol/L — ABNORMAL LOW (ref 101–111)
Creatinine, Ser: 0.55 mg/dL — ABNORMAL LOW (ref 0.61–1.24)
GFR calc Af Amer: >60 mL/min (ref >60)
GFR calc non Af Amer: >60 mL/min (ref >60)
GLUCOSE: 103 mg/dL — AB (ref 65–99)
Glucose, Bld: 112 mg/dL — ABNORMAL HIGH (ref 65–99)
POTASSIUM: 4.3 mmol/L (ref 3.5–5.1)
Potassium: 4.6 mmol/L (ref 3.5–5.1)
SODIUM: 132 mmol/L — AB (ref 135–145)
Sodium: 133 mmol/L — ABNORMAL LOW (ref 135–145)

## 2016-09-22 LAB — MAGNESIUM: Magnesium: 2.7 mg/dL — ABNORMAL HIGH (ref 1.7–2.4)

## 2016-09-22 LAB — PHOSPHORUS
PHOSPHORUS: 1.9 mg/dL — AB (ref 2.5–4.6)
Phosphorus: 1.5 mg/dL — ABNORMAL LOW (ref 2.5–4.6)

## 2016-09-22 MED ORDER — SODIUM CHLORIDE 0.9 % IV SOLN
2.0000 g | Freq: Once | INTRAVENOUS | Status: AC
  Administered 2016-09-22: 2 g via INTRAVENOUS
  Filled 2016-09-22: qty 20

## 2016-09-22 MED ORDER — SODIUM CHLORIDE 0.9 % IV SOLN
INTRAVENOUS | Status: DC
Start: 2016-09-22 — End: 2016-09-23
  Administered 2016-09-22: 75 mL/h via INTRAVENOUS

## 2016-09-22 MED ORDER — SODIUM PHOSPHATES 45 MMOLE/15ML IV SOLN
30.0000 mmol | Freq: Once | INTRAVENOUS | Status: AC
  Administered 2016-09-22: 30 mmol via INTRAVENOUS
  Filled 2016-09-22: qty 10

## 2016-09-22 MED ORDER — SODIUM PHOSPHATES 45 MMOLE/15ML IV SOLN
20.0000 mmol | Freq: Once | INTRAVENOUS | Status: AC
  Administered 2016-09-22: 20 mmol via INTRAVENOUS
  Filled 2016-09-22: qty 6.67

## 2016-09-22 NOTE — Progress Notes (Signed)
[facility] [facility] [place] NAME: Austin Patterson    MR#:  [id number]  DATE OF BIRTH:  1956-10-09  SUBJECTIVE:  No complaints. No vomiting. Tolerating FLD  REVIEW OF SYSTEMS:   Review of Systems  Constitutional: Negative for chills, fever and weight loss.  HENT: Negative for ear discharge, ear pain and nosebleeds.   Eyes: Negative for blurred vision, pain and discharge.  Respiratory: Negative for sputum production, shortness of breath, wheezing and stridor.   Cardiovascular: Negative for chest pain, palpitations, orthopnea and PND.  Gastrointestinal: Negative for diarrhea, nausea and vomiting.  Genitourinary: Negative for frequency and urgency.  Musculoskeletal: Negative for back pain and joint pain.  Neurological: Positive for weakness. Negative for sensory change, speech change and focal weakness.  Psychiatric/Behavioral: Negative for depression and hallucinations. The patient is not nervous/anxious.     Tolerating Diet: yes Tolerating PT: not needed  DRUG ALLERGIES:  No Known Allergies  VITALS:  Blood pressure (!) 129/55, pulse 93, temperature 98.2 F (36.8 C), temperature source Oral, resp. rate 19, height 5\' 7"  (1.702 m), weight 78.7 kg (173 lb 8 oz), SpO2 100 %.  PHYSICAL EXAMINATION:   Physical Exam  GENERAL:  60 y.o.-year-old patient lying in the bed with no acute distress.  EYES: Pupils equal, round, reactive to light and accommodation. No scleral icterus. Extraocular muscles intact.  HEENT: Head atraumatic, normocephalic. Oropharynx and nasopharynx clear.  NECK:  Supple, no jugular venous distention. No thyroid enlargement, no tenderness.  LUNGS: Normal breath sounds bilaterally, no wheezing, rales, rhonchi. No use of accessory muscles of respiration.  CARDIOVASCULAR: S1, S2 normal. No murmurs, rubs, or gallops.  ABDOMEN: Soft, nontender, nondistended. Bowel sounds present. No organomegaly or mass.  EXTREMITIES: No  cyanosis, clubbing or edema b/l.    NEUROLOGIC: Cranial nerves II through XII are intact. No focal Motor or sensory deficits b/l.   PSYCHIATRIC:  patient is alert and oriented x 3.  SKIN: No obvious rash, lesion, or ulcer.   LABORATORY PANEL:  CBC  Recent Labs Lab 09/21/16 0446  WBC 8.9  HGB 12.5*  HCT 34.6*  PLT 217    Chemistries   Recent Labs Lab 09/21/16 0446  09/22/16 0520  NA 115*  < > 133*  K <2.0*  < > 4.3  CL 72*  < > 98*  CO2 21*  < > 29  GLUCOSE 109*  < > 103*  BUN 11  < > 7  CREATININE 0.71  < > 0.55*  CALCIUM 6.8*  < > 6.9*  MG 0.5*  < > 2.7*  AST 88*  --   --   ALT 46  --   --   ALKPHOS 26*  --   --   BILITOT 1.1  --   --   < > = values in this interval not displayed. Cardiac Enzymes  Recent Labs Lab 09/21/16 0446  TROPONINI <0.03   RADIOLOGY:  No results found. ASSESSMENT AND PLAN:  Austin Patterson  is a 60 y.o. male with a known history of Chronic alcohol use, COPD, hypertension, coronary artery disease status post stent 1 in 2010 and history of pulmonary fibrosis chronic follows with pulmonologist and on chronic home oxygen comes in with complaints of coffee-ground emesis and epigastric discomfort along with chest tightness for 1 week.  1. Acute on chronic epigastric pain with chest tightness and history of nausea vomiting with coffee-ground emesis along with alcohol abuse suspect alcohol induced gastritis -IV fluids -IV  Protonix twice a day -GI consultation discussed with Dr. Vicente Males who will do EGD today  2. Hypokalemia hyponatremia hypomagnesemia hypophosphatemia hypochloremia suspect due to alcohol abuse and poor by mouth intake -Pharmacy consult for electrolyte replacement  3. Coronary artery disease status post stent in 2010 -Cardiac enzymes negative. EKG does not show any changes -Spoke with Dr. Clayborn Bigness . Cleared for GI eval  4. Idiopathic pulmonary fibrosis on chronic home oxygen and chronic steroids -Continue meds and oxygen  along with nebulizer if needed  5. Hypertension resume home meds  6. DVT prophylaxis SCDs and teds. I'll hold off antiplatelet agent with coffee-ground emesis  Case discussed with Care Management/Social Worker. Management plans discussed with the patient, family and they are in agreement.  CODE STATUS: full DVT Prophylaxis: SCD TOTAL TIME TAKING CARE OF THIS PATIENT: 30 minutes.  >50% time spent on counselling and coordination of care  POSSIBLE D/C IN 1-2DAYS, DEPENDING ON CLINICAL CONDITION.  Note: This dictation was prepared with Dragon dictation along with smaller phrase technology. Any transcriptional errors that result from this process are unintentional.  Earle Troiano M.D on 09/22/2016 at 6:18 PM  Between 7am to 6pm - Pager - [phone]  After 6pm go to [email or web address] - password [email or web address] [facility]  Office  [phone]  CC: Primary care physician; Lavonne Chick, MD

## 2016-09-22 NOTE — Consult Note (Signed)
MEDICATION RELATED CONSULT NOTE -Follow Up  Pharmacy Consult for electrolytes Indication: hypokalemia, hypomagnesium, hypophos  No Known Allergies  Patient Measurements: Height: 5\' 7"  (170.2 cm) Weight: 173 lb 8 oz (78.7 kg) IBW/kg (Calculated) : 66.1 Adjusted Body Weight:   Vital Signs: Temp: 98.2 F (36.8 C) (12/05 1200) Temp Source: Oral (12/05 1200) BP: 129/55 (12/05 1200) Pulse Rate: 93 (12/05 1200) Intake/Output from previous day: 12/04 0701 - 12/05 0700 In: 1401.7 [I.V.:1401.7] Out: 3550 [Urine:3550] Intake/Output from this shift: Total I/O In: 760 [P.O.:760] Out: -   Labs:  Recent Labs  09/21/16 0446 09/21/16 1723 09/22/16 0520 09/22/16 1811  WBC 8.9  --   --   --   HGB 12.5*  --   --   --   HCT 34.6*  --   --   --   PLT 217  --   --   --   CREATININE 0.71 0.70 0.55* 0.60*  MG 0.5* 1.6* 2.7*  --   PHOS 2.4*  --  1.5* 1.9*  ALBUMIN 4.5  --   --   --   PROT 6.9  --   --   --   AST 88*  --   --   --   ALT 46  --   --   --   ALKPHOS 26*  --   --   --   BILITOT 1.1  --   --   --    Estimated Creatinine Clearance: 91.8 mL/min (by C-G formula based on SCr of 0.6 mg/dL (L)).   Microbiology: No results found for this or any previous visit (from the past 720 hour(s)).  Medical History: Past Medical History:  Diagnosis Date  . COPD (chronic obstructive pulmonary disease) ([place])   . Hyperlipidemia   . Hypertension   . Pulmonary fibrosis ([place]) 11/2015    Medications:  Scheduled:  . amLODipine  10 mg Oral Daily  . aspirin EC  81 mg Oral Daily  . atorvastatin  40 mg Oral QHS  . clopidogrel  75 mg Oral Daily  . enoxaparin (LOVENOX) injection  40 mg Subcutaneous Q24H  . folic acid  1 mg Oral Daily  . loratadine  10 mg Oral Daily  . metoprolol succinate  50 mg Oral Daily  . multivitamin with minerals  1 tablet Oral Daily  . pantoprazole (PROTONIX) IV  40 mg Intravenous BID  . predniSONE  7.5 mg Oral Daily  . thiamine  100 mg Oral Daily   Or  .  thiamine  100 mg Intravenous Daily  . umeclidinium-vilanterol  1 puff Inhalation Daily    Assessment: Pt is a 60 year old male who presents with chest pain and vomiting. Pt admits to drinking 6 beers/day.   Goal of Therapy:  Normalization of electrolytes  Plan:  K: 4.6, Phos: 1.9  Will change maintenance fluids to NS and give NaPhos 14mmol x 1. Recheck electrolytes with AM labs.    Rexene Edison, PharmD Clinical Pharmacist 09/22/2016,7:13 PM

## 2016-09-22 NOTE — Consult Note (Signed)
MEDICATION RELATED CONSULT NOTE -Follow Up  Pharmacy Consult for electrolytes Indication: hypokalemia, hypomagnesium, hypophos  No Known Allergies  Patient Measurements: Height: 5\' 7"  (170.2 cm) Weight: 173 lb 8 oz (78.7 kg) IBW/kg (Calculated) : 66.1 Adjusted Body Weight:   Vital Signs: Temp: 97.4 F (36.3 C) (12/05 0504) Temp Source: Oral (12/05 0504) BP: 108/72 (12/05 0504) Pulse Rate: 86 (12/05 0504) Intake/Output from previous day: 12/04 0701 - 12/05 0700 In: 1401.7 [I.V.:1401.7] Out: 3350 [Urine:3350] Intake/Output from this shift: No intake/output data recorded.  Labs:  Recent Labs  09/21/16 0446 09/21/16 1723 09/22/16 0520  WBC 8.9  --   --   HGB 12.5*  --   --   HCT 34.6*  --   --   PLT 217  --   --   CREATININE 0.71 0.70 0.55*  MG 0.5* 1.6* 2.7*  PHOS 2.4*  --  1.5*  ALBUMIN 4.5  --   --   PROT 6.9  --   --   AST 88*  --   --   ALT 46  --   --   ALKPHOS 26*  --   --   BILITOT 1.1  --   --    Estimated Creatinine Clearance: 91.8 mL/min (by C-G formula based on SCr of 0.55 mg/dL (L)).   Microbiology: No results found for this or any previous visit (from the past 720 hour(s)).  Medical History: Past Medical History:  Diagnosis Date  . COPD (chronic obstructive pulmonary disease) ([place])   . Hyperlipidemia   . Hypertension   . Pulmonary fibrosis ([place]) 11/2015    Medications:  Scheduled:  . amLODipine  10 mg Oral Daily  . aspirin EC  81 mg Oral Daily  . atorvastatin  40 mg Oral QHS  . clopidogrel  75 mg Oral Daily  . enoxaparin (LOVENOX) injection  40 mg Subcutaneous Q24H  . folic acid  1 mg Oral Daily  . folic acid  1 mg Oral Daily  . loratadine  10 mg Oral Daily  . metoprolol succinate  50 mg Oral Daily  . multivitamin with minerals  1 tablet Oral Daily  . multivitamin with minerals  1 tablet Oral Daily  . pantoprazole (PROTONIX) IV  40 mg Intravenous BID  . predniSONE  7.5 mg Oral Daily  . thiamine  100 mg Oral Daily   Or  .  thiamine  100 mg Intravenous Daily  . thiamine  100 mg Oral Daily  . umeclidinium-vilanterol  1 puff Inhalation Daily    Assessment: Pt is a 60 year old male who presents with chest pain and vomiting. Pt admits to drinking 6 beers/day. Pt found to have a K of <2, Mg=0.5, phos =2.4 Na=115 and Ca=6.8  12/5 AM K=4.3, Mg=2.7, phos=1.5, ca=6.9, Na=133  Goal of Therapy:  Normalization of electrolytes  Plan:  NaPhos 28mmol IV once, Ca gluconate 2g IV once. Recheck at 1800.      Ramond Dial, Pharm.D, BCPS Clinical Pharmacist  09/22/2016,7:48 AM

## 2016-09-22 NOTE — Progress Notes (Signed)
Patient presently resting in the room, alert and oriented, consent sign for procedure in the AM, patient condition unchanged at this time

## 2016-09-22 NOTE — Care Management (Signed)
Patient presents from home where he lives with his wife.  Chronic home 02 through [place].  He is very fidgety and giggles during discussion.

## 2016-09-22 NOTE — Progress Notes (Signed)
  Austin Bellows MD [address] [place], [place] [address] Phone: [phone] Fax : [phone]  Austin Patterson is being followed for GI bleed  Day 2 of follow up   Subjective: No further coffee ground emesis. Has black stool. No abdominal pain.    Objective: Vital signs in last 24 hours: Vitals:   09/21/16 1500 09/21/16 2036 09/22/16 0504 09/22/16 1200  BP: 138/80 123/67 108/72   Pulse: 91 88 86 88  Resp:  19 19   Temp:  98.2 F (36.8 C) 97.4 F (36.3 C)   TempSrc:  Oral Oral   SpO2:  98% 100%   Weight:      Height:       Weight change: -7 lb 8 oz (-3.402 kg)  Intake/Output Summary (Last 24 hours) at 09/22/16 1240 Last data filed at 09/22/16 1100  Gross per 24 hour  Intake          1641.67 ml  Output             3550 ml  Net         -1908.33 ml     Exam: Heart:: Regular rate and rhythm Lungs: normal Abdomen: soft, nontender, normal bowel sounds   Lab Results: CBC Latest Ref Rng & Units 09/21/2016 03/09/2016 01/22/2016  WBC 3.8 - 10.6 K/uL 8.9 11.1(H) 10.6  Hemoglobin 13.0 - 18.0 g/dL 12.5(L) 10.5(L) 10.8(L)  Hematocrit 40.0 - 52.0 % 34.6(L) 32.8(L) 33.1(L)  Platelets 150 - 440 K/uL 217 426 809(H)   Micro Results: No results found for this or any previous visit (from the past 240 hour(s)). Studies/Results: No results found. Medications: I have reviewed the patient's current medications. Scheduled Meds: . amLODipine  10 mg Oral Daily  . aspirin EC  81 mg Oral Daily  . atorvastatin  40 mg Oral QHS  . clopidogrel  75 mg Oral Daily  . enoxaparin (LOVENOX) injection  40 mg Subcutaneous Q24H  . folic acid  1 mg Oral Daily  . loratadine  10 mg Oral Daily  . metoprolol succinate  50 mg Oral Daily  . multivitamin with minerals  1 tablet Oral Daily  . pantoprazole (PROTONIX) IV  40 mg Intravenous BID  . predniSONE  7.5 mg Oral Daily  . sodium phosphate  Dextrose 5% IVPB  30 mmol Intravenous Once  . thiamine  100 mg Oral Daily   Or  . thiamine  100  mg Intravenous Daily  . umeclidinium-vilanterol  1 puff Inhalation Daily   Continuous Infusions: . 0.9 % NaCl with KCl 40 mEq / L 100 mL/hr (09/22/16 0727)   PRN Meds:.acetaminophen **OR** acetaminophen, albuterol, LORazepam **OR** LORazepam, ondansetron **OR** ondansetron (ZOFRAN) IV, senna-docusate   Assessment: Active Problems:   Hyponatremia Austin Patterson is a 60 y.o. y/o male with history of alcohol abuse admitted with abdominal pain,coffee ground emesis. Hb stable. Multiple electrolytes were  very low on admission which have been replaced, repeat labs pending He is on plavix, PPI,Asprin . Presently no overt signs of bleeding . History is suggestive of possible mallory weiss tear +/- gastritis.    Plan: 1 If electrolytes are normalized, will plan for EGD tomorrow  2. Since he is on Plavix no active endoscopic intervention can be done, if any required, will have to repeat off plavix. 3. Continue PPI 4. Keep NPO from midnight    LOS: 1 day   Austin Patterson 09/22/2016, 12:40 PM

## 2016-09-23 ENCOUNTER — Encounter: Payer: Self-pay | Admitting: Anesthesiology

## 2016-09-23 ENCOUNTER — Inpatient Hospital Stay: Payer: Medicaid Other | Admitting: Anesthesiology

## 2016-09-23 ENCOUNTER — Encounter
Admission: EM | Disposition: A | Discharge status: Home / Self Care | Payer: Self-pay | Source: Home / Self Care | Attending: Internal Medicine

## 2016-09-23 DIAGNOSIS — K920 Hematemesis: Secondary | ICD-10-CM

## 2016-09-23 DIAGNOSIS — K2100 Gastro-esophageal reflux disease with esophagitis, without bleeding: Secondary | ICD-10-CM

## 2016-09-23 DIAGNOSIS — K449 Diaphragmatic hernia without obstruction or gangrene: Secondary | ICD-10-CM

## 2016-09-23 HISTORY — PX: ESOPHAGOGASTRODUODENOSCOPY (EGD) WITH PROPOFOL: SHX5813

## 2016-09-23 LAB — COMPREHENSIVE METABOLIC PANEL
ALBUMIN: 3.5 g/dL (ref 3.5–5.0)
ALT: 30 U/L (ref 17–63)
AST: 51 U/L — ABNORMAL HIGH (ref 15–41)
Alkaline Phosphatase: 27 U/L — ABNORMAL LOW (ref 38–126)
Anion gap: 7 (ref 5–15)
BUN: <5 mg/dL — ABNORMAL LOW (ref 6–20)
CHLORIDE: 100 mmol/L — AB (ref 101–111)
CO2: 26 mmol/L (ref 22–32)
CREATININE: 0.55 mg/dL — AB (ref 0.61–1.24)
Calcium: 6.9 mg/dL — ABNORMAL LOW (ref 8.9–10.3)
GFR calc non Af Amer: >60 mL/min (ref >60)
Glucose, Bld: 108 mg/dL — ABNORMAL HIGH (ref 65–99)
Potassium: 3.7 mmol/L (ref 3.5–5.1)
SODIUM: 133 mmol/L — AB (ref 135–145)
Total Bilirubin: 0.5 mg/dL (ref 0.3–1.2)
Total Protein: 5.9 g/dL — ABNORMAL LOW (ref 6.5–8.1)

## 2016-09-23 LAB — PHOSPHORUS: PHOSPHORUS: 2.6 mg/dL (ref 2.5–4.6)

## 2016-09-23 LAB — MAGNESIUM: Magnesium: 1.8 mg/dL (ref 1.7–2.4)

## 2016-09-23 SURGERY — ESOPHAGOGASTRODUODENOSCOPY (EGD) WITH PROPOFOL
Anesthesia: General

## 2016-09-23 MED ORDER — SODIUM CHLORIDE 0.9 % IV SOLN
2.0000 g | Freq: Once | INTRAVENOUS | Status: AC
  Administered 2016-09-23: 2 g via INTRAVENOUS
  Filled 2016-09-23: qty 20

## 2016-09-23 MED ORDER — CALCIUM CARBONATE-VITAMIN D 500-200 MG-UNIT PO TABS: 2.0000 | ORAL_TABLET | Freq: Every day | ORAL | 0 refills | Status: DC

## 2016-09-23 MED ORDER — ADULT MULTIVITAMIN W/MINERALS CH: 1.0000 | ORAL_TABLET | Freq: Every day | ORAL | 0 refills | Status: DC

## 2016-09-23 MED ORDER — CALCIUM CARBONATE-VITAMIN D 500-200 MG-UNIT PO TABS
2.0000 | ORAL_TABLET | Freq: Every day | ORAL | Status: DC
  Administered 2016-09-23: 14:00:00 2 via ORAL
  Filled 2016-09-23: qty 2

## 2016-09-23 MED ORDER — LIDOCAINE HCL (CARDIAC) 20 MG/ML IV SOLN
INTRAVENOUS | Status: DC | PRN
  Administered 2016-09-23: 60 mg via INTRAVENOUS

## 2016-09-23 MED ORDER — POTASSIUM CHLORIDE IN NACL 20-0.9 MEQ/L-% IV SOLN
INTRAVENOUS | Status: DC
  Filled 2016-09-23 (×2): qty 1000

## 2016-09-23 MED ORDER — SODIUM CHLORIDE 0.9 % IV SOLN
INTRAVENOUS | Status: DC
  Administered 2016-09-23: 12:00:00 via INTRAVENOUS

## 2016-09-23 MED ORDER — PROPOFOL 10 MG/ML IV BOLUS
INTRAVENOUS | Status: DC | PRN
  Administered 2016-09-23: 80 mg via INTRAVENOUS

## 2016-09-23 MED ORDER — PROPOFOL 500 MG/50ML IV EMUL
INTRAVENOUS | Status: DC | PRN
  Administered 2016-09-23: 140 ug/kg/min via INTRAVENOUS

## 2016-09-23 MED ORDER — PANTOPRAZOLE SODIUM 40 MG PO TBEC: 40.0000 mg | DELAYED_RELEASE_TABLET | Freq: Two times a day (BID) | ORAL | 3 refills | Status: DC

## 2016-09-23 NOTE — Care Management (Signed)
No discharge needs identified by members of the care team or patient 

## 2016-09-23 NOTE — Progress Notes (Signed)
Discharge teaching given to pt. With teachback prescription sent with pt. Discharge home with spouse in private vehicle. Pt. Oxygen tank sent with pt. On $l. Iv site d/c without difficulty.

## 2016-09-23 NOTE — Consult Note (Signed)
MEDICATION RELATED CONSULT NOTE -Follow Up  Pharmacy Consult for electrolytes Indication: hypokalemia, hypomagnesium, hypophos  No Known Allergies  Patient Measurements: Height: 5\' 7"  (170.2 cm) Weight: 173 lb 8 oz (78.7 kg) IBW/kg (Calculated) : 66.1 Adjusted Body Weight:   Vital Signs: Temp: 98.1 F (36.7 C) (12/06 0412) Temp Source: Oral (12/06 0412) BP: 120/66 (12/06 0412) Pulse Rate: 81 (12/06 0412) Intake/Output from previous day: 12/05 0701 - 12/06 0700 In: 1000 [P.O.:1000] Out: 3300 [Urine:3300] Intake/Output from this shift: No intake/output data recorded.  Labs:  Recent Labs  09/21/16 0446 09/21/16 1723 09/22/16 0520 09/22/16 1811 09/23/16 0341  WBC 8.9  --   --   --   --   HGB 12.5*  --   --   --   --   HCT 34.6*  --   --   --   --   PLT 217  --   --   --   --   CREATININE 0.71 0.70 0.55* 0.60* 0.55*  MG 0.5* 1.6* 2.7*  --  1.8  PHOS 2.4*  --  1.5* 1.9* 2.6  ALBUMIN 4.5  --   --   --  3.5  PROT 6.9  --   --   --  5.9*  AST 88*  --   --   --  51*  ALT 46  --   --   --  30  ALKPHOS 26*  --   --   --  27*  BILITOT 1.1  --   --   --  0.5   Lab Results  Component Value Date   K 3.7 09/23/2016    Estimated Creatinine Clearance: 91.8 mL/min (by C-G formula based on SCr of 0.55 mg/dL (L)).   Microbiology: No results found for this or any previous visit (from the past 720 hour(s)).  Medical History: Past Medical History:  Diagnosis Date  . COPD (chronic obstructive pulmonary disease) ([place])   . Hyperlipidemia   . Hypertension   . Pulmonary fibrosis ([place]) 11/2015    Medications:  Scheduled:  . amLODipine  10 mg Oral Daily  . aspirin EC  81 mg Oral Daily  . atorvastatin  40 mg Oral QHS  . calcium gluconate  2 g Intravenous Once  . calcium-vitamin D  2 tablet Oral Q breakfast  . clopidogrel  75 mg Oral Daily  . enoxaparin (LOVENOX) injection  40 mg Subcutaneous Q24H  . folic acid  1 mg Oral Daily  . loratadine  10 mg Oral Daily  .  metoprolol succinate  50 mg Oral Daily  . multivitamin with minerals  1 tablet Oral Daily  . pantoprazole (PROTONIX) IV  40 mg Intravenous BID  . predniSONE  7.5 mg Oral Daily  . thiamine  100 mg Oral Daily   Or  . thiamine  100 mg Intravenous Daily  . umeclidinium-vilanterol  1 puff Inhalation Daily    Assessment: Pt is a 60 year old male who presents with chest pain and vomiting. Pt admits to drinking 6 beers/day.   Goal of Therapy:  Normalization of electrolytes  Plan:  K=3.7, Mg=1.8, phos=2.6, ca=6.9  Calcium gluconate 2g IV once. Will add 20 MEQ to his 0.9% NS bag @ 47ml/hr for a little extra supplementation. Will also start a calcium carbonate w/ vit D supplement daily. Follow up in the AM if pt has not d/c.   Ramond Dial, Pharm.D, BCPS Clinical Pharmacist  09/23/2016,7:45 AM

## 2016-09-23 NOTE — Transfer of Care (Signed)
Immediate Anesthesia Transfer of Care Note  Patient: Austin Patterson  Procedure(s) Performed: Procedure(s): ESOPHAGOGASTRODUODENOSCOPY (EGD) WITH PROPOFOL (N/A)  Patient Location: PACU  Anesthesia Type:General  Level of Consciousness: awake and patient cooperative  Airway & Oxygen Therapy: Patient Spontanous Breathing and Patient connected to nasal cannula oxygen  Post-op Assessment: Report given to RN and Post -op Vital signs reviewed and stable  Post vital signs: Reviewed and stable  Last Vitals:  Vitals:   09/23/16 1202 09/23/16 1248  BP: (!) 147/82   Pulse:    Resp: 17   Temp: 36.2 C (P) 36.2 C    Last Pain:  Vitals:   09/23/16 1248  TempSrc: (P) Tympanic  PainSc:          Complications: No apparent anesthesia complications

## 2016-09-23 NOTE — Discharge Summary (Addendum)
[facility] [place] NAME: Austin Patterson    MR#:  [id number]  DATE OF BIRTH:  [birth year]  DATE OF ADMISSION:  09/21/2016 ADMITTING PHYSICIAN: Fritzi Mandes, MD  DATE OF DISCHARGE: 09/23/16  PRIMARY CARE PHYSICIAN: Lavonne Chick, MD    ADMISSION DIAGNOSIS:  Hypokalemia [E87.6] Hypomagnesemia [E83.42] Hyponatremia [E87.1] Chest pain, unspecified type [R07.9]  DISCHARGE DIAGNOSIS:  Acute Esophagitis/GERD Hypomagnesemia hypokalemia hypophosphatemia repleted now. Ongoing alcohol abuse.  History of CAD    Past Medical History:  Diagnosis Date  . COPD (chronic obstructive pulmonary disease) ([place])   . Hyperlipidemia   . Hypertension   . Pulmonary fibrosis ([place]) 11/2015    HOSPITAL COURSE:   Austin Tillinghastis a 60 y.o. malewith a known history of Chronic alcohol use, COPD, hypertension, coronary artery disease status post stent 1 in 2010 and history of pulmonary fibrosis chronic follows with pulmonologist and on chronic home oxygen comes in with complaints of coffee-ground emesis and epigastric discomfort along with chest tightness for 1 week.  1. Acute on chronic epigastric pain with chest tightness and history of nausea vomiting with coffee-ground emesis along with alcohol abuse due to alcohol induced gastritis/esophagitis as seen on EGD today -Received IV fluids -IV Protonix twice a day--changed to oral meds -GI consultation discussed with Dr. Vicente Males  -EGD showed gastritis/esophagitis and hiatal hernia  2. Hypokalemia hyponatremia hypomagnesemia hypophosphatemia hypochloremia suspect due to alcohol abuse and poor by mouth intake -Pharmacy consult for electrolyte replacement -Electrolytes repleted patient advised abstinence from alcohol  3. Coronary artery disease status post stent in 2010 -Cardiac enzymes negative. EKG does not show any changes -Spoke with Dr. Clayborn Bigness . Cleared for GI eval  4. Idiopathic  pulmonary fibrosis on chronic home oxygen and chronic steroids -Continue meds and oxygen along with nebulizer if needed  5. Hypertension resume home meds  6. DVT prophylaxis SCDs and teds. I'll hold off antiplatelet agent with coffee-ground emesis  Overall stable will discharge today. Patient had IV sedation for procedure and recommended not to drive his car. He will need to have family come pick it off.  CONSULTS OBTAINED:  Treatment Team:  Lucilla Lame, MD Yolonda Kida, MD  DRUG ALLERGIES:  No Known Allergies  DISCHARGE MEDICATIONS:   Current Discharge Medication List    START taking these medications   Details  calcium-vitamin D (OSCAL WITH D) 500-200 MG-UNIT tablet Take 2 tablets by mouth daily with breakfast. Qty: 60 tablet, Refills: 0    Multiple Vitamin (MULTIVITAMIN WITH MINERALS) TABS tablet Take 1 tablet by mouth daily. Qty: 30 tablet, Refills: 0      CONTINUE these medications which have CHANGED   Details  pantoprazole (PROTONIX) 40 MG tablet Take 1 tablet (40 mg total) by mouth 2 (two) times daily. Qty: 90 tablet, Refills: 3      CONTINUE these medications which have NOT CHANGED   Details  albuterol (PROVENTIL HFA;VENTOLIN HFA) 108 (90 Base) MCG/ACT inhaler Inhale 2 puffs into the lungs every 4 (four) hours as needed for wheezing or shortness of breath. Qty: 3 Inhaler, Refills: 3    amLODipine (NORVASC) 10 MG tablet Take 1 tablet (10 mg total) by mouth daily. Qty: 90 tablet, Refills: 3    aspirin EC 81 MG tablet Take 1 tablet (81 mg total) by mouth daily. Qty: 90 tablet, Refills: 3    atorvastatin (LIPITOR) 40 MG tablet Take 1 tablet (40 mg total) by mouth at bedtime. Qty: 90 tablet, Refills: 3  cetirizine (ZYRTEC) 10 MG tablet Take 10 mg by mouth at bedtime.    clopidogrel (PLAVIX) 75 MG tablet Take 1 tablet (75 mg total) by mouth daily. Qty: 90 tablet, Refills: 3    losartan-hydrochlorothiazide (HYZAAR) 100-12.5 MG tablet Take 1 tablet by  mouth daily. Qty: 90 tablet, Refills: 3    metoprolol succinate (TOPROL-XL) 50 MG 24 hr tablet Take 1 tablet (50 mg total) by mouth daily. Qty: 90 tablet, Refills: 3    predniSONE (DELTASONE) 5 MG tablet Take 1.5 tablet daily Qty: 45 tablet, Refills: 3    umeclidinium-vilanterol (ANORO ELLIPTA) 62.5-25 MCG/INH AEPB INHALE ONE PUFF INTO THE LUNGS DAILY Qty: 90 each, Refills: 1        If you experience worsening of your admission symptoms, develop shortness of breath, life threatening emergency, suicidal or homicidal thoughts you must seek medical attention immediately by calling 911 or calling your MD immediately  if symptoms less severe.  You Must read complete instructions/literature along with all the possible adverse reactions/side effects for all the Medicines you take and that have been prescribed to you. Take any new Medicines after you have completely understood and accept all the possible adverse reactions/side effects.   Please note  You were cared for by a hospitalist during your hospital stay. If you have any questions about your discharge medications or the care you received while you were in the hospital after you are discharged, you can call the unit and asked to speak with the hospitalist on call if the hospitalist that took care of you is not available. Once you are discharged, your primary care physician will handle any further medical issues. Please note that NO REFILLS for any discharge medications will be authorized once you are discharged, as it is imperative that you return to your primary care physician (or establish a relationship with a primary care physician if you do not have one) for your aftercare needs so that they can reassess your need for medications and monitor your lab values. Today   SUBJECTIVE   Doing well  VITAL SIGNS:  Blood pressure (!) 147/82, pulse 85, temperature 97.2 F (36.2 C), temperature source Tympanic, resp. rate 14, height 5\' 7"  (1.702  m), weight 78.7 kg (173 lb 8 oz), SpO2 99 %.  I/O:    Intake/Output Summary (Last 24 hours) at 09/23/16 1505 Last data filed at 09/23/16 1300  Gross per 24 hour  Intake             1460 ml  Output             3400 ml  Net            -1940 ml    PHYSICAL EXAMINATION:  GENERAL:  60 y.o.-year-old patient lying in the bed with no acute distress.  EYES: Pupils equal, round, reactive to light and accommodation. No scleral icterus. Extraocular muscles intact.  HEENT: Head atraumatic, normocephalic. Oropharynx and nasopharynx clear.  NECK:  Supple, no jugular venous distention. No thyroid enlargement, no tenderness.  LUNGS: Normal breath sounds bilaterally, no wheezing, rales,rhonchi or crepitation. No use of accessory muscles of respiration.  CARDIOVASCULAR: S1, S2 normal. No murmurs, rubs, or gallops.  ABDOMEN: Soft, non-tender, non-distended. Bowel sounds present. No organomegaly or mass.  EXTREMITIES: No pedal edema, cyanosis, or clubbing.  NEUROLOGIC: Cranial nerves II through XII are intact. Muscle strength 5/5 in all extremities. Sensation intact. Gait not checked.  PSYCHIATRIC: The patient is alert and oriented x 3.  SKIN: No  obvious rash, lesion, or ulcer.   DATA REVIEW:   CBC   Recent Labs Lab 09/21/16 0446  WBC 8.9  HGB 12.5*  HCT 34.6*  PLT 217    Chemistries   Recent Labs Lab 09/23/16 0341  NA 133*  K 3.7  CL 100*  CO2 26  GLUCOSE 108*  BUN <5*  CREATININE 0.55*  CALCIUM 6.9*  MG 1.8  AST 51*  ALT 30  ALKPHOS 27*  BILITOT 0.5    Microbiology Results   No results found for this or any previous visit (from the past 240 hour(s)).  RADIOLOGY:  No results found.   Management plans discussed with the patient, family and they are in agreement.  CODE STATUS:     Code Status Orders        Start     Ordered   09/21/16 0915  Full code  Continuous     09/21/16 0914    Code Status History    Date Active Date Inactive Code Status Order ID  Comments User Context   12/09/2015  5:05 PM 12/16/2015  6:03 PM Full Code [id number]  Bettey Costa, MD Inpatient   10/14/2015  1:42 PM 10/16/2015  4:29 PM Full Code [id number]  Nicholes Mango, MD Inpatient      TOTAL TIME TAKING CARE OF THIS PATIENT: 40 minutes.    Ciaira Natividad M.D on 09/23/2016 at 3:05 PM  Between 7am to 6pm - Pager - [phone] After 6pm go to [email or web address] - password [email or web address] [facility]  Office  [phone]  CC: Primary care physician; Lavonne Chick, MD

## 2016-09-23 NOTE — Op Note (Signed)
[facility] Gastroenterology Patient Name: Austin Patterson Procedure Date: 09/23/2016 12:29 PM MRN: [id number] Account #: [id number] Date of Birth: 05-24-1956 Admit Type: Inpatient Age: 60 Room: [facility] ENDO ROOM 1 Gender: Male Note Status: Finalized Procedure:            Upper GI endoscopy Indications:          Hematemesis Providers:            [name] MD, MD Referring MD:         No Local Md, MD (Referring MD) Medicines:            Monitored Anesthesia Care Complications:        No immediate complications. Procedure:            Pre-Anesthesia Assessment:                       - Prior to the procedure, a History and Physical was                        performed, and patient medications, allergies and                        sensitivities were reviewed. The patient's tolerance of                        previous anesthesia was reviewed.                       - The risks and benefits of the procedure and the                        sedation options and risks were discussed with the                        patient. All questions were answered and informed                        consent was obtained.                       - The risks and benefits of the procedure and the                        sedation options and risks were discussed with the                        patient. All questions were answered and informed                        consent was obtained.                       - ASA Grade Assessment: III - A patient with severe                        systemic disease.                       After obtaining informed consent, the endoscope was  passed under direct vision. Throughout the procedure,                        the patient's blood pressure, pulse, and oxygen                        saturations were monitored continuously. The Endoscope                        was introduced through the mouth, and advanced to the                         third part of duodenum. The upper GI endoscopy was                        accomplished with ease. The patient tolerated the                        procedure well. Findings:      The examined duodenum was normal.      A 3 cm hiatal hernia was present.      LA Grade A (one or more mucosal breaks less than 5 mm, not extending       between tops of 2 mucosal folds) esophagitis with no bleeding was found       40 to 43 cm from the incisors. Impression:           - Normal examined duodenum.                       - 3 cm hiatal hernia.                       - LA Grade A reflux esophagitis.                       - No specimens collected. Recommendation:       - Patient has a contact number available for                        emergencies. The signs and symptoms of potential                        delayed complications were discussed with the patient.                        Return to normal activities tomorrow. Written discharge                        instructions were provided to the patient.                       - Resume previous diet.                       - Continue present medications.                       - Return to my office in 4 weeks. Procedure Code(s):    --- Professional ---  [id number], Esophagogastroduodenoscopy, flexible, transoral;                        diagnostic, including collection of specimen(s) by                        brushing or washing, when performed (separate procedure) Diagnosis Code(s):    --- Professional ---                       K44.9, Diaphragmatic hernia without obstruction or                        gangrene                       K21.0, Gastro-esophageal reflux disease with esophagitis                       K92.0, Hematemesis CPT copyright 2016 American Medical Association. All rights reserved. The codes documented in this report are preliminary and upon coder review may  be revised to meet current compliance requirements. [name],  MD [name] MD, MD 09/23/2016 12:41:06 PM This report has been signed electronically. Number of Addenda: 0 Note Initiated On: 09/23/2016 12:29 PM      [facility]

## 2016-09-23 NOTE — Progress Notes (Signed)
EGD showed esophagitis and a hiatal hernia.   Discharge on PPI. Advance diet as tolerated.    Dr [name]  Gastroenterology/Hepatology Pager: [phone]

## 2016-09-23 NOTE — Anesthesia Preprocedure Evaluation (Signed)
Anesthesia Evaluation  Patient identified by MRN, date of birth, ID band Patient awake    Reviewed: Allergy & Precautions, H&P , NPO status , Patient's Chart, lab work & pertinent test results, reviewed documented beta blocker date and time   History of Anesthesia Complications Negative for: history of anesthetic complications  Airway Mallampati: I  TM Distance: >3 FB Neck ROM: full    Dental  (+) Missing, Poor Dentition   Pulmonary shortness of breath and with exertion, neg sleep apnea, COPD,  COPD inhaler and oxygen dependent, neg recent URI, former smoker,    Pulmonary exam normal  + decreased breath sounds      Cardiovascular Exercise Tolerance: Good hypertension, On Medications (-) angina+ CAD and + Cardiac Stents  (-) Past MI and (-) CABG Normal cardiovascular exam(-) dysrhythmias (-) Valvular Problems/Murmurs Rhythm:regular Rate:Normal     Neuro/Psych negative neurological ROS  negative psych ROS   GI/Hepatic Neg liver ROS, GERD  ,  Endo/Other  negative endocrine ROS  Renal/GU negative Renal ROS  negative genitourinary   Musculoskeletal   Abdominal   Peds  Hematology  (+) Blood dyscrasia, anemia ,   Anesthesia Other Findings Past Medical History: No date: COPD (chronic obstructive pulmonary disease) (* No date: Hyperlipidemia No date: Hypertension 11/2015: Pulmonary fibrosis ([facility])   Reproductive/Obstetrics negative OB ROS                             Anesthesia Physical Anesthesia Plan  ASA: III  Anesthesia Plan: General   Post-op Pain Management:    Induction:   Airway Management Planned:   Additional Equipment:   Intra-op Plan:   Post-operative Plan:   Informed Consent: I have reviewed the patients History and Physical, chart, labs and discussed the procedure including the risks, benefits and alternatives for the proposed anesthesia with the patient or authorized  representative who has indicated his/her understanding and acceptance.   Dental Advisory Given  Plan Discussed with: Anesthesiologist, CRNA and Surgeon  Anesthesia Plan Comments:         Anesthesia Quick Evaluation

## 2016-09-23 NOTE — H&P (Signed)
[name] MD [address] [place], [place] [address] Phone: [phone] Fax : [phone]  Primary Care Physician:  Lavonne Chick, MD Primary Gastroenterologist:  Dr. [name]   Pre-Procedure History & Physical: HPI:  Austin Patterson is a 60 y.o. male is here for an endoscopy.   Past Medical History:  Diagnosis Date  . COPD (chronic obstructive pulmonary disease) ([place])   . Hyperlipidemia   . Hypertension   . Pulmonary fibrosis ([place]) 11/2015    Past Surgical History:  Procedure Laterality Date  . CORONARY STENT PLACEMENT    . SHOULDER ACROMIOPLASTY      Prior to Admission medications   Medication Sig Start Date End Date Taking? Authorizing Provider  albuterol (PROVENTIL HFA;VENTOLIN HFA) 108 (90 Base) MCG/ACT inhaler Inhale 2 puffs into the lungs every 4 (four) hours as needed for wheezing or shortness of breath. 08/19/16  Yes Tawni Millers, MD  amLODipine (NORVASC) 10 MG tablet Take 1 tablet (10 mg total) by mouth daily. 08/19/16  Yes Tawni Millers, MD  aspirin EC 81 MG tablet Take 1 tablet (81 mg total) by mouth daily. 08/19/16  Yes Tawni Millers, MD  atorvastatin (LIPITOR) 40 MG tablet Take 1 tablet (40 mg total) by mouth at bedtime. 08/19/16  Yes Tawni Millers, MD  cetirizine (ZYRTEC) 10 MG tablet Take 10 mg by mouth at bedtime. 08/19/16  Yes Historical Provider, MD  clopidogrel (PLAVIX) 75 MG tablet Take 1 tablet (75 mg total) by mouth daily. 08/19/16  Yes Tawni Millers, MD  losartan-hydrochlorothiazide (HYZAAR) 100-12.5 MG tablet Take 1 tablet by mouth daily. 08/19/16  Yes Tawni Millers, MD  metoprolol succinate (TOPROL-XL) 50 MG 24 hr tablet Take 1 tablet (50 mg total) by mouth daily. 08/19/16  Yes Tawni Millers, MD  pantoprazole (PROTONIX) 40 MG tablet Take 1 tablet (40 mg total) by mouth daily. 08/19/16  Yes Tawni Millers, MD  predniSONE (DELTASONE) 5 MG tablet Take 1.5 tablet daily Patient taking differently: Take 7.5 mg by mouth daily. Take 1.5 tablet  daily 08/31/16  Yes Laverle Hobby, MD  umeclidinium-vilanterol [facility] ELLIPTA) 62.5-25 MCG/INH AEPB INHALE ONE PUFF INTO THE LUNGS DAILY 08/19/16  Yes Tawni Millers, MD    Allergies as of 09/21/2016  . (No Known Allergies)    Family History  Problem Relation Age of Onset  . Heart disease Mother     Social History   Social History  . Marital status: Married    Spouse name: N/A  . Number of children: N/A  . Years of education: N/A   Occupational History  . Not on file.   Social History Main Topics  . Smoking status: Former Smoker    Quit date: 10/14/2015  . Smokeless tobacco: Former Systems developer  . Alcohol use 16.8 oz/week    28 Cans of beer per week     Comment: 6 12 oz cans of beer/day  . Drug use: No  . Sexual activity: Not on file   Other Topics Concern  . Not on file   Social History Narrative  . No narrative on file    Review of Systems: See HPI, otherwise negative ROS  Physical Exam: BP (!) 147/82   Pulse 86   Temp 97.2 F (36.2 C) (Tympanic)   Resp 17   Ht 5\' 7"  (1.702 m)   Wt 173 lb 8 oz (78.7 kg)   SpO2 98%   BMI 27.17 kg/m  General:   Alert,  pleasant and cooperative in NAD Head:  Normocephalic and atraumatic. Neck:  Supple; no masses or thyromegaly. Lungs:  Clear throughout to auscultation.    Heart:  Regular rate and rhythm. Abdomen:  Soft, nontender and nondistended. Normal bowel sounds, without guarding, and without rebound.   Neurologic:  Alert and  oriented x4;  grossly normal neurologically.  Impression/Plan: Austin Patterson is here for an endoscopy to be performed for gi bleed  Risks, benefits, limitations, and alternatives regarding  endoscopy have been reviewed with the patient.  Questions have been answered.  All parties agreeable.   [name], MD  09/23/2016, 12:22 PM

## 2016-09-24 ENCOUNTER — Encounter: Payer: Self-pay | Admitting: Gastroenterology

## 2016-09-25 NOTE — Anesthesia Postprocedure Evaluation (Signed)
Anesthesia Post Note  Patient: Austin Patterson  Procedure(s) Performed: Procedure(s) (LRB): ESOPHAGOGASTRODUODENOSCOPY (EGD) WITH PROPOFOL (N/A)  Patient location during evaluation: Endoscopy Anesthesia Type: General Level of consciousness: awake and alert Pain management: pain level controlled Vital Signs Assessment: post-procedure vital signs reviewed and stable Respiratory status: spontaneous breathing, nonlabored ventilation, respiratory function stable and patient connected to nasal cannula oxygen Cardiovascular status: blood pressure returned to baseline and stable Postop Assessment: no signs of nausea or vomiting Anesthetic complications: no    Last Vitals:  Vitals:   09/23/16 1202 09/23/16 1248  BP: (!) 147/82   Pulse:  85  Resp: 17 14  Temp: 36.2 C 36.2 C    Last Pain:  Vitals:   09/23/16 1248  TempSrc: Tympanic  PainSc:                  Martha Clan

## 2016-09-29 ENCOUNTER — Telehealth: Payer: Self-pay | Admitting: Internal Medicine

## 2016-09-29 NOTE — Telephone Encounter (Signed)
Spoke with Anderson Malta with [facility], who states pt is concerned about starting zemaira, due to previously being on this medication and now be dx with liver disease. Pt was currently admitted and was told he had a liver disease, pt states he did not have this disease prior to being on zemaira. Anderson Malta states this order was just sent to them from April and would like to know if if they should proceed with pt order.   DR please advise. Thanks.

## 2016-09-29 NOTE — Telephone Encounter (Signed)
Austin Patterson Calling from [facility]  She Spoke with patient. He has some concerns about doing the infusions Would like a call back to talk about this.

## 2016-09-29 NOTE — Telephone Encounter (Signed)
It is unlikely that his liver disease is from zemaira. Recommend that he stay on it, recheck liver function test one month after starting it. He also needs to stop drinking as this will make his liver disease worse.

## 2016-09-30 NOTE — Telephone Encounter (Signed)
Spoke with Anderson Malta and made her aware of DR recommendations. I have LM for pt to make him aware of this as well. Will await call back

## 2016-10-02 NOTE — Telephone Encounter (Signed)
Lmtcb. Will await call back 

## 2016-10-02 NOTE — Telephone Encounter (Signed)
Patient returning call - left message with answering service 10/02/16 @ 8:20am - pr

## 2016-10-02 NOTE — Telephone Encounter (Signed)
Spoke with Austin Patterson who states he would like to hold off on restarting zemaira, as he is feeling better now then he did while on this medication. Austin Patterson states his PCP rechecked liver function test on Wednesday. Austin Patterson also states he hasn't drink in 12 days.  Will route to DR as a Juluis Rainier

## 2016-10-13 ENCOUNTER — Ambulatory Visit (INDEPENDENT_AMBULATORY_CARE_PROVIDER_SITE_OTHER): Payer: Medicaid Other | Admitting: Gastroenterology

## 2016-10-13 ENCOUNTER — Encounter: Payer: Medicaid Other | Attending: Internal Medicine | Admitting: Respiratory Therapy

## 2016-10-13 ENCOUNTER — Encounter: Payer: Self-pay | Admitting: Gastroenterology

## 2016-10-13 VITALS — Ht 66.75 in | Wt 177.0 lb

## 2016-10-13 VITALS — BP 149/83 | HR 114 | Temp 97.8°F | Ht 67.0 in | Wt 181.0 lb

## 2016-10-13 DIAGNOSIS — K2100 Gastro-esophageal reflux disease with esophagitis, without bleeding: Secondary | ICD-10-CM

## 2016-10-13 DIAGNOSIS — F1010 Alcohol abuse, uncomplicated: Secondary | ICD-10-CM

## 2016-10-13 DIAGNOSIS — J449 Chronic obstructive pulmonary disease, unspecified: Secondary | ICD-10-CM | POA: Diagnosis present

## 2016-10-13 NOTE — Patient Instructions (Addendum)
Gastroesophageal Reflux Disease, Adult Introduction Normally, food travels down the esophagus and stays in the stomach to be digested. If a person has gastroesophageal reflux disease (GERD), food and stomach acid move back up into the esophagus. When this happens, the esophagus becomes sore and swollen (inflamed). Over time, GERD can make small holes (ulcers) in the lining of the esophagus. Follow these instructions at home: Diet  Follow a diet as told by your doctor. You may need to avoid foods and drinks such as:  Coffee and tea (with or without caffeine).  Drinks that contain alcohol.  Energy drinks and sports drinks.  Carbonated drinks or sodas.  Chocolate and cocoa.  Peppermint and mint flavorings.  Garlic and onions.  Horseradish.  Spicy and acidic foods, such as peppers, chili powder, curry powder, vinegar, hot sauces, and BBQ sauce.  Citrus fruit juices and citrus fruits, such as oranges, lemons, and limes.  Tomato-based foods, such as red sauce, chili, salsa, and pizza with red sauce.  Fried and fatty foods, such as donuts, french fries, potato chips, and high-fat dressings.  High-fat meats, such as hot dogs, rib eye steak, sausage, ham, and bacon.  High-fat dairy items, such as whole milk, butter, and cream cheese.  Eat small meals often. Avoid eating large meals.  Avoid drinking large amounts of liquid with your meals.  Avoid eating meals during the 2-3 hours before bedtime.  Avoid lying down right after you eat.  Do not exercise right after you eat. General instructions  Pay attention to any changes in your symptoms.  Take over-the-counter and prescription medicines only as told by your doctor. Do not take aspirin, ibuprofen, or other NSAIDs unless your doctor says it is okay.  Do not use any tobacco products, including cigarettes, chewing tobacco, and e-cigarettes. If you need help quitting, ask your doctor.  Wear loose clothes. Do not wear anything  tight around your waist.  Raise (elevate) the head of your bed about 6 inches (15 cm).  Try to lower your stress. If you need help doing this, ask your doctor.  If you are overweight, lose an amount of weight that is healthy for you. Ask your doctor about a safe weight loss goal.  Keep all follow-up visits as told by your doctor. This is important. Contact a doctor if:  You have new symptoms.  You lose weight and you do not know why it is happening.  You have trouble swallowing, or it hurts to swallow.  You have wheezing or a cough that keeps happening.  Your symptoms do not get better with treatment.  You have a hoarse voice. Get help right away if:  You have pain in your arms, neck, jaw, teeth, or back.  You feel sweaty, dizzy, or light-headed.  You have chest pain or shortness of breath.  You throw up (vomit) and your throw up looks like blood or coffee grounds.  You pass out (faint).  Your poop (stool) is bloody or black.  You cannot swallow, drink, or eat. This information is not intended to replace advice given to you by your health care provider. Make sure you discuss any questions you have with your health care provider. Document Released: 03/23/2008 Document Revised: 03/12/2016 Document Reviewed: 01/30/2015  2017 [employer]  

## 2016-10-13 NOTE — Progress Notes (Signed)
Pulmonary Individual Treatment Plan  Patient Details  Name: Austin Patterson MRN: [id number] Date of Birth: 06-Sep-1956 Referring Provider:   Flowsheet Row Pulmonary Rehab from 10/13/2016 in [facility] Cardiac and Pulmonary Rehab  Referring Provider  Laverle Hobby MD      Initial Encounter Date:  Flowsheet Row Pulmonary Rehab from 10/13/2016 in [facility] Cardiac and Pulmonary Rehab  Date  10/13/16  Referring Provider  Laverle Hobby MD      Visit Diagnosis: COPD, moderate ([place])  Patient's Home Medications on Admission:  Current Outpatient Prescriptions:    albuterol (PROVENTIL HFA;VENTOLIN HFA) 108 (90 Base) MCG/ACT inhaler, Inhale 2 puffs into the lungs every 4 (four) hours as needed for wheezing or shortness of breath., Disp: 3 Inhaler, Rfl: 3   amLODipine (NORVASC) 10 MG tablet, Take 1 tablet (10 mg total) by mouth daily., Disp: 90 tablet, Rfl: 3   aspirin EC 81 MG tablet, Take 1 tablet (81 mg total) by mouth daily., Disp: 90 tablet, Rfl: 3   atorvastatin (LIPITOR) 40 MG tablet, Take 1 tablet (40 mg total) by mouth at bedtime., Disp: 90 tablet, Rfl: 3   calcium-vitamin D (OSCAL WITH D) 500-200 MG-UNIT tablet, Take 2 tablets by mouth daily with breakfast., Disp: 60 tablet, Rfl: 0   cetirizine (ZYRTEC) 10 MG tablet, Take 10 mg by mouth at bedtime., Disp: , Rfl:    clopidogrel (PLAVIX) 75 MG tablet, Take 1 tablet (75 mg total) by mouth daily., Disp: 90 tablet, Rfl: 3   losartan-hydrochlorothiazide (HYZAAR) 100-12.5 MG tablet, Take 1 tablet by mouth daily., Disp: 90 tablet, Rfl: 3   metoprolol succinate (TOPROL-XL) 50 MG 24 hr tablet, Take 1 tablet (50 mg total) by mouth daily., Disp: 90 tablet, Rfl: 3   Multiple Vitamin (MULTIVITAMIN WITH MINERALS) TABS tablet, Take 1 tablet by mouth daily., Disp: 30 tablet, Rfl: 0   pantoprazole (PROTONIX) 40 MG tablet, Take 1 tablet (40 mg total) by mouth 2 (two) times daily., Disp: 90 tablet, Rfl: 3   predniSONE (DELTASONE) 5 MG  tablet, Take 1.5 tablet daily (Patient taking differently: Take 7.5 mg by mouth daily. 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 Latest Ref Rng & Units 10/14/2015 12/09/2015   Hemoglobin A1c 4.0 - 6.0 % - 4.9   HCO3 21.0 - 28.0 mEq/L 29.9(H) -       ADL [facility]:     Pulmonary Assessment Scores    Row Name 10/13/16 1147         ADL [facility]SD   SOB Score total 38     Rest 0     Walk 3     Stairs 4     Bath 2     Dress 0     Shop 2        Pulmonary Function Assessment:     Pulmonary Function Assessment - 10/13/16 1146      Pulmonary Function Tests   RV% 68 %  DLCO% 26 %     Initial Spirometry Results   FVC% 61 %   FEV1% 52 %   FEV1/FVC Ratio 65   Comments Test date 01/29/16     Post Bronchodilator Spirometry Results   FVC% 61 %   FEV1% 52 %   FEV1/FVC Ratio 65     Breath   Shortness of Breath Yes;Limiting activity      Exercise Target Goals: Date: 10/13/16  Exercise Program Goal: Individual exercise prescription set with THRR, safety & activity barriers. Participant demonstrates ability to understand and report RPE using BORG scale, to self-measure pulse accurately, and to acknowledge the importance of the exercise prescription.  Exercise Prescription Goal: Starting with aerobic activity 30 plus minutes a day, 3 days per week for initial exercise prescription. Provide home exercise prescription and guidelines that participant acknowledges  understanding prior to discharge.  Activity Barriers & Risk Stratification:     Activity Barriers & Cardiac Risk Stratification - 10/13/16 1145      Activity Barriers & Cardiac Risk Stratification   Activity Barriers Shortness of Breath;Deconditioning   Cardiac Risk Stratification Moderate      6 Minute Walk:     6 Minute Walk    Row Name 10/13/16 1155         6 Minute Walk   Phase Initial     Distance 1486 feet     Walk Time 6 minutes     # of Rest Breaks 0     MPH 2.81     METS 3.95     RPE 13     Perceived Dyspnea  3     VO2 Peak 13.81     Symptoms No     Resting HR 100 bpm     Resting BP 124/72     Max Ex. HR 117 bpm     Max Ex. BP 136/74     2 Minute Post BP 126/70       Interval HR   Baseline HR 100     1 Minute HR 109     2 Minute HR 112     3 Minute HR 113     4 Minute HR 113     5 Minute HR 117     6 Minute HR 116     2 Minute Post HR 99     Interval Heart Rate? Yes       Interval Oxygen   Interval Oxygen? Yes     Baseline Oxygen Saturation % 97 %     Baseline Liters of Oxygen 2 L     1 Minute Oxygen Saturation % 95 %     1 Minute Liters of Oxygen 2 L     2 Minute Oxygen Saturation % 98 %     2 Minute Liters of Oxygen 2 L     3 Minute Oxygen Saturation % 98 %     3 Minute Liters of Oxygen 2 L     4 Minute Oxygen Saturation % 95 %     4 Minute Liters of Oxygen 2 L     5 Minute Oxygen Saturation % 96 %     5 Minute Liters of Oxygen 2 L     6 Minute Oxygen Saturation % 96 %     6 Minute Liters of Oxygen 2 L     2 Minute Post Oxygen Saturation % 96 %     2 Minute Post Liters of Oxygen 2 L  Initial Exercise Prescription:     Initial Exercise Prescription - 10/13/16 1100      Date of Initial Exercise RX and Referring Provider   Date 10/13/16   Referring Provider Laverle Hobby MD     Oxygen   Oxygen Continuous   Liters 2     Treadmill   MPH 2.5   Grade 0.5   Minutes 15   METs 3.09     NuStep   Level 3   Watts  --  60-80 spm   Minutes 15   METs 2     Recumbant Elliptical   Level 2   RPM 50   Minutes 15   METs 2     Prescription Details   Frequency (times per week) 3   Duration Progress to 45 minutes of aerobic exercise without signs/symptoms of physical distress     Intensity   THRR 40-80% of Max Heartrate 124-148   Ratings of Perceived Exertion 11-15   Perceived Dyspnea 0-4     Progression   Progression Continue to progress workloads to maintain intensity without signs/symptoms of physical distress.     Resistance Training   Training Prescription Yes   Weight 3 lbs   Reps 10-15      Perform Capillary Blood Glucose checks as needed.  Exercise Prescription Changes:     Exercise Prescription Changes    Row Name 10/13/16 1100             Exercise Review   Progression --  walk test results         Response to Exercise   Blood Pressure (Admit) 124/72       Blood Pressure (Exercise) 136/74       Blood Pressure (Exit) 126/70       Heart Rate (Admit) 100 bpm       Heart Rate (Exercise) 117 bpm       Heart Rate (Exit) 99 bpm       Oxygen Saturation (Admit) 97 %       Oxygen Saturation (Exercise) 95 %       Oxygen Saturation (Exit) 96 %       Rating of Perceived Exertion (Exercise) 13       Perceived Dyspnea (Exercise) 3       Symptoms none          Exercise Comments:     Exercise Comments    Row Name 10/13/16 1200           Exercise Comments Rusty wants to reduce the amount of oxygen he is wearing regularly.          Discharge Exercise Prescription (Final Exercise Prescription Changes):     Exercise Prescription Changes - 10/13/16 1100      Exercise Review   Progression --  walk test results     Response to Exercise   Blood Pressure (Admit) 124/72   Blood Pressure (Exercise) 136/74   Blood Pressure (Exit) 126/70   Heart Rate (Admit) 100 bpm   Heart Rate (Exercise) 117 bpm   Heart Rate (Exit) 99 bpm   Oxygen Saturation (Admit) 97 %   Oxygen  Saturation (Exercise) 95 %   Oxygen Saturation (Exit) 96 %   Rating of Perceived Exertion (Exercise) 13   Perceived Dyspnea (Exercise) 3   Symptoms none       Nutrition:  Target Goals: Understanding of nutrition guidelines, daily intake of sodium 1500mg , cholesterol 200mg , calories 30% from fat and 7% or less  from saturated fats, daily to have 5 or more servings of fruits and vegetables.  Biometrics:     Pre Biometrics - 10/13/16 1200      Pre Biometrics   Height 5' 6.75" (1.695 m)   Weight 177 lb (80.3 kg)   Waist Circumference 38 inches   Hip Circumference 39.5 inches   Waist to Hip Ratio 0.96 %   BMI (Calculated) 28       Nutrition Therapy Plan and Nutrition Goals:   Nutrition Discharge: Rate Your Plate Scores:   Psychosocial: Target Goals: Acknowledge presence or absence of depression, maximize coping skills, provide positive support system. Participant is able to verbalize types and ability to use techniques and skills needed for reducing stress and depression.  Initial Review & Psychosocial Screening:     Initial Psych Review & Screening - 10/13/16 1153      Family Dynamics   Good Support System? Yes   Comments Mr Tygart does have good support from his friends. He is in the middle of a divorce from his wife of some twenty years. Mr Talford states he misses going to a job and some activities he use to perform. He is excited about [facility] and improving his activity level.      Barriers   Psychosocial barriers to participate in program The patient should benefit from training in stress management and relaxation.     Screening Interventions   Interventions Encouraged to exercise;Program counselor consult      Quality of Life Scores:     Quality of Life - 10/13/16 1157      Quality of Life Scores   Health/Function Pre 20.69 %   Socioeconomic Pre 19.14 %   Psych/Spiritual Pre 20.86 %   Family Pre 19.6 %   GLOBAL Pre 20.26 %       PHQ-9: Recent Review Flowsheet Data    Depression screen [facility] 2/9 10/13/2016   Decreased Interest 3   Down, Depressed, Hopeless 2   PHQ - 2 Score 5   Altered sleeping 0   Tired, decreased energy 3   Change in appetite 3   Feeling bad or failure about yourself  0   Trouble concentrating 3   Moving slowly or fidgety/restless 0   Suicidal thoughts 1   PHQ-9 Score 15   Difficult doing work/chores Extremely dIfficult      Psychosocial Evaluation and Intervention:   Psychosocial Re-Evaluation:  Education: Education Goals: Education classes will be provided on a weekly basis, covering required topics. Participant will state understanding/return demonstration of topics presented.  Learning Barriers/Preferences:     Learning Barriers/Preferences - 10/13/16 1146      Learning Barriers/Preferences   Learning Barriers None   Learning Preferences None      Education Topics: Initial Evaluation Education: - Verbal, written and demonstration of respiratory meds, RPE/PD scales, oximetry and breathing techniques. Instruction on use of nebulizers and MDIs: cleaning and proper use, rinsing mouth with steroid doses and importance of monitoring MDI activations. Flowsheet Row Pulmonary Rehab from 10/13/2016 in [facility] Cardiac and Pulmonary Rehab  Date  10/13/16  Educator  LB  Instruction Review Code  2- meets goals/outcomes      General Nutrition Guidelines/Fats and Fiber: -Group instruction provided by verbal, written material, models and posters to present the general guidelines for heart healthy nutrition. Gives an explanation and review of dietary fats and fiber.   Controlling Sodium/Reading Food Labels: -Group verbal and written material supporting the discussion of sodium use in heart  healthy nutrition. Review and explanation with models, verbal and written materials for utilization of the food label.   Exercise Physiology & Risk Factors: - Group verbal and written  instruction with models to review the exercise physiology of the cardiovascular system and associated critical values. Details cardiovascular disease risk factors and the goals associated with each risk factor.   Aerobic Exercise & Resistance Training: - Gives group verbal and written discussion on the health impact of inactivity. On the components of aerobic and resistive training programs and the benefits of this training and how to safely progress through these programs.   Flexibility, Balance, General Exercise Guidelines: - Provides group verbal and written instruction on the benefits of flexibility and balance training programs. Provides general exercise guidelines with specific guidelines to those with heart or lung disease. Demonstration and skill practice provided.   Stress Management: - Provides group verbal and written instruction about the health risks of elevated stress, cause of high stress, and healthy ways to reduce stress.   Depression: - Provides group verbal and written instruction on the correlation between heart/lung disease and depressed mood, treatment options, and the stigmas associated with seeking treatment.   Exercise & Equipment Safety: - Individual verbal instruction and demonstration of equipment use and safety with use of the equipment.   Infection Prevention: - Provides verbal and written material to individual with discussion of infection control including proper hand washing and proper equipment cleaning during exercise session. Flowsheet Row Pulmonary Rehab from 10/13/2016 in [facility] Cardiac and Pulmonary Rehab  Date  10/13/16  Educator  LB  Instruction Review Code  2- meets goals/outcomes      Falls Prevention: - Provides verbal and written material to individual with discussion of falls prevention and safety. Flowsheet Row Pulmonary Rehab from 10/13/2016 in [facility] Cardiac and Pulmonary Rehab  Date  10/13/16  Educator  LB  Instruction Review Code  2-  meets goals/outcomes      Diabetes: - Individual verbal and written instruction to review signs/symptoms of diabetes, desired ranges of glucose level fasting, after meals and with exercise. Advice that pre and post exercise glucose checks will be done for 3 sessions at entry of program.   Chronic Lung Diseases: - Group verbal and written instruction to review new updates, new respiratory medications, new advancements in procedures and treatments. Provide informative websites and "800" numbers of self-education.   Lung Procedures: - Group verbal and written instruction to describe testing methods done to diagnose lung disease. Review the outcome of test results. Describe the treatment choices: Pulmonary Function Tests, ABGs and oximetry.   Energy Conservation: - Provide group verbal and written instruction for methods to conserve energy, plan and organize activities. Instruct on pacing techniques, use of adaptive equipment and posture/positioning to relieve shortness of breath.   Triggers: - Group verbal and written instruction to review types of environmental controls: home humidity, furnaces, filters, dust mite/pet prevention, HEPA vacuums. To discuss weather changes, air quality and the benefits of nasal washing.   Exacerbations: - Group verbal and written instruction to provide: warning signs, infection symptoms, calling MD promptly, preventive modes, and value of vaccinations. Review: effective airway clearance, coughing and/or vibration techniques. Create an Sports administrator.   Oxygen: - Individual and group verbal and written instruction on oxygen therapy. Includes supplement oxygen, available portable oxygen systems, continuous and intermittent flow rates, oxygen safety, concentrators, and Medicare reimbursement for oxygen. Flowsheet Row Pulmonary Rehab from 10/13/2016 in [facility] Cardiac and Pulmonary Rehab  Date  10/13/16  Educator  LB      Respiratory Medications: - Group verbal  and written instruction to review medications for lung disease. Drug class, frequency, complications, importance of spacers, rinsing mouth after steroid MDI's, and proper cleaning methods for nebulizers. Flowsheet Row Pulmonary Rehab from 10/13/2016 in [facility] Cardiac and Pulmonary Rehab  Date  10/13/16  Educator  LB  Instruction Review Code  2- meets goals/outcomes      AED/CPR: - Group verbal and written instruction with the use of models to demonstrate the basic use of the AED with the basic ABC's of resuscitation.   Breathing Retraining: - Provides individuals verbal and written instruction on purpose, frequency, and proper technique of diaphragmatic breathing and pursed-lipped breathing. Applies individual practice skills. Flowsheet Row Pulmonary Rehab from 10/13/2016 in [facility] Cardiac and Pulmonary Rehab  Date  10/13/16  Educator  LB  Instruction Review Code  2- meets goals/outcomes      Anatomy and Physiology of the Lungs: - Group verbal and written instruction with the use of models to provide basic lung anatomy and physiology related to function, structure and complications of lung disease.   Heart Failure: - Group verbal and written instruction on the basics of heart failure: signs/symptoms, treatments, explanation of ejection fraction, enlarged heart and cardiomyopathy.   Sleep Apnea: - Individual verbal and written instruction to review Obstructive Sleep Apnea. Review of risk factors, methods for diagnosing and types of masks and machines for OSA.   Anxiety: - Provides group, verbal and written instruction on the correlation between heart/lung disease and anxiety, treatment options, and management of anxiety.   Relaxation: - Provides group, verbal and written instruction about the benefits of relaxation for patients with heart/lung disease. Also provides patients with examples of relaxation techniques.   Knowledge Questionnaire Score:     Knowledge Questionnaire  Score - 10/13/16 1146      Knowledge Questionnaire Score   Pre Score 7/10       Core Components/Risk Factors/Patient Goals at Admission:     Personal Goals and Risk Factors at Admission - 10/13/16 1150      Core Components/Risk Factors/Patient Goals on Admission    Weight Management Yes;Weight Loss   Intervention Weight Management: Develop a combined nutrition and exercise program designed to reach desired caloric intake, while maintaining appropriate intake of nutrient and fiber, sodium and fats, and appropriate energy expenditure required for the weight goal.;Weight Management: Provide education and appropriate resources to help participant work on and attain dietary goals.   Admit Weight 177 lb (80.3 kg)   Goal Weight: Short Term 172 lb (78 kg)   Goal Weight: Long Term 167 lb (75.8 kg)   Expected Outcomes Short Term: Continue to assess and modify interventions until short term weight is achieved;Long Term: Adherence to nutrition and physical activity/exercise program aimed toward attainment of established weight goal;Weight Loss: Understanding of general recommendations for a balanced deficit meal plan, which promotes 1-2 lb weight loss per week and includes a negative energy balance of [phone] kcal/d;Understanding recommendations for meals to include 15-35% energy as protein, 25-35% energy from fat, 35-60% energy from carbohydrates, less than 200mg  of dietary cholesterol, 20-35 gm of total fiber daily;Understanding of distribution of calorie intake throughout the day with the consumption of 4-5 meals/snacks   Sedentary Yes   Intervention Provide advice, education, support and counseling about physical activity/exercise needs.;Develop an individualized exercise prescription for aerobic and resistive training based on initial evaluation findings, risk stratification, comorbidities and participant's personal goals.   Expected  Outcomes Achievement of increased cardiorespiratory fitness and  enhanced flexibility, muscular endurance and strength shown through measurements of functional capacity and personal statement of participant.   Increase Strength and Stamina Yes   Intervention Provide advice, education, support and counseling about physical activity/exercise needs.;Develop an individualized exercise prescription for aerobic and resistive training based on initial evaluation findings, risk stratification, comorbidities and participant's personal goals.   Expected Outcomes Achievement of increased cardiorespiratory fitness and enhanced flexibility, muscular endurance and strength shown through measurements of functional capacity and personal statement of participant.   Improve shortness of breath with ADL's Yes   Intervention Provide education, individualized exercise plan and daily activity instruction to help decrease symptoms of SOB with activities of daily living.   Expected Outcomes Short Term: Achieves a reduction of symptoms when performing activities of daily living.   Develop more efficient breathing techniques such as purse lipped breathing and diaphragmatic breathing; and practicing self-pacing with activity Yes   Intervention Provide education, demonstration and support about specific breathing techniuqes utilized for more efficient breathing. Include techniques such as pursed lipped breathing, diaphragmatic breathing and self-pacing activity.   Expected Outcomes Short Term: Participant will be able to demonstrate and use breathing techniques as needed throughout daily activities.   Increase knowledge of respiratory medications and ability to use respiratory devices properly  Yes  ProAir MDI; oxygen 2-4l/m   Intervention Provide education and demonstration as needed of appropriate use of medications, inhalers, and oxygen therapy.   Expected Outcomes Short Term: Achieves understanding of medications use. Understands that oxygen is a medication prescribed by physician.  Demonstrates appropriate use of inhaler and oxygen therapy.   Hypertension Yes   Intervention Provide education on lifestyle modifcations including regular physical activity/exercise, weight management, moderate sodium restriction and increased consumption of fresh fruit, vegetables, and low fat dairy, alcohol moderation, and smoking cessation.;Monitor prescription use compliance.   Expected Outcomes Short Term: Continued assessment and intervention until BP is < 140/34mm HG in hypertensive participants. < 130/58mm HG in hypertensive participants with diabetes, heart failure or chronic kidney disease.;Long Term: Maintenance of blood pressure at goal levels.   Lipids Yes   Intervention Provide education and support for participant on nutrition & aerobic/resistive exercise along with prescribed medications to achieve LDL 70mg , HDL >40mg .   Expected Outcomes Short Term: Participant states understanding of desired cholesterol values and is compliant with medications prescribed. Participant is following exercise prescription and nutrition guidelines.;Long Term: Cholesterol controlled with medications as prescribed, with individualized exercise RX and with personalized nutrition plan. Value goals: LDL < 70mg , HDL > 40 mg.      Core Components/Risk Factors/Patient Goals Review:    Core Components/Risk Factors/Patient Goals at Discharge (Final Review):    ITP Comments:   Comments: Mr Chamberland plans to start [facility] on 10/21/16 and will attend 3 days/week.

## 2016-10-13 NOTE — Patient Instructions (Signed)
Patient Instructions  Patient Details  Name: Austin Patterson MRN: [id number] Date of Birth: Apr 19, 1956 Referring Provider:  Laverle Hobby, *  Below are the personal goals you chose as well as exercise and nutrition goals. Our goal is to help you keep on track towards obtaining and maintaining your goals. We will be discussing your progress on these goals with you throughout the program.  Initial Exercise Prescription:     Initial Exercise Prescription - 10/13/16 1100      Date of Initial Exercise RX and Referring Provider   Date 10/13/16   Referring Provider Laverle Hobby MD     Oxygen   Oxygen Continuous   Liters 2     Treadmill   MPH 2.5   Grade 0.5   Minutes 15   METs 3.09     NuStep   Level 3   Watts --  60-80 spm   Minutes 15   METs 2     Recumbant Elliptical   Level 2   RPM 50   Minutes 15   METs 2     Prescription Details   Frequency (times per week) 3   Duration Progress to 45 minutes of aerobic exercise without signs/symptoms of physical distress     Intensity   THRR 40-80% of Max Heartrate 124-148   Ratings of Perceived Exertion 11-15   Perceived Dyspnea 0-4     Progression   Progression Continue to progress workloads to maintain intensity without signs/symptoms of physical distress.     Resistance Training   Training Prescription Yes   Weight 3 lbs   Reps 10-15      Exercise Goals: Frequency: Be able to perform aerobic exercise three times per week working toward 3-5 days per week.  Intensity: Work with a perceived exertion of 11 (fairly light) - 15 (hard) as tolerated. Follow your new exercise prescription and watch for changes in prescription as you progress with the program. Changes will be reviewed with you when they are made.  Duration: You should be able to do 30 minutes of continuous aerobic exercise in addition to a 5 minute warm-up and a 5 minute cool-down routine.  Nutrition Goals: Your personal nutrition goals  will be established when you do your nutrition analysis with the dietician.  The following are nutrition guidelines to follow: Cholesterol < 200mg /day Sodium < 1500mg /day Fiber: Men over 50 yrs - 30 grams per day  Personal Goals:     Personal Goals and Risk Factors at Admission - 10/13/16 1150      Core Components/Risk Factors/Patient Goals on Admission    Weight Management Yes;Weight Loss   Intervention Weight Management: Develop a combined nutrition and exercise program designed to reach desired caloric intake, while maintaining appropriate intake of nutrient and fiber, sodium and fats, and appropriate energy expenditure required for the weight goal.;Weight Management: Provide education and appropriate resources to help participant work on and attain dietary goals.   Admit Weight 177 lb (80.3 kg)   Goal Weight: Short Term 172 lb (78 kg)   Goal Weight: Long Term 167 lb (75.8 kg)   Expected Outcomes Short Term: Continue to assess and modify interventions until short term weight is achieved;Long Term: Adherence to nutrition and physical activity/exercise program aimed toward attainment of established weight goal;Weight Loss: Understanding of general recommendations for a balanced deficit meal plan, which promotes 1-2 lb weight loss per week and includes a negative energy balance of [phone] kcal/d;Understanding recommendations for meals to include 15-35% energy as  protein, 25-35% energy from fat, 35-60% energy from carbohydrates, less than 200mg  of dietary cholesterol, 20-35 gm of total fiber daily;Understanding of distribution of calorie intake throughout the day with the consumption of 4-5 meals/snacks   Sedentary Yes   Intervention Provide advice, education, support and counseling about physical activity/exercise needs.;Develop an individualized exercise prescription for aerobic and resistive training based on initial evaluation findings, risk stratification, comorbidities and participant's  personal goals.   Expected Outcomes Achievement of increased cardiorespiratory fitness and enhanced flexibility, muscular endurance and strength shown through measurements of functional capacity and personal statement of participant.   Increase Strength and Stamina Yes   Intervention Provide advice, education, support and counseling about physical activity/exercise needs.;Develop an individualized exercise prescription for aerobic and resistive training based on initial evaluation findings, risk stratification, comorbidities and participant's personal goals.   Expected Outcomes Achievement of increased cardiorespiratory fitness and enhanced flexibility, muscular endurance and strength shown through measurements of functional capacity and personal statement of participant.   Improve shortness of breath with ADL's Yes   Intervention Provide education, individualized exercise plan and daily activity instruction to help decrease symptoms of SOB with activities of daily living.   Expected Outcomes Short Term: Achieves a reduction of symptoms when performing activities of daily living.   Develop more efficient breathing techniques such as purse lipped breathing and diaphragmatic breathing; and practicing self-pacing with activity Yes   Intervention Provide education, demonstration and support about specific breathing techniuqes utilized for more efficient breathing. Include techniques such as pursed lipped breathing, diaphragmatic breathing and self-pacing activity.   Expected Outcomes Short Term: Participant will be able to demonstrate and use breathing techniques as needed throughout daily activities.   Increase knowledge of respiratory medications and ability to use respiratory devices properly  Yes  ProAir MDI; oxygen 2-4l/m   Intervention Provide education and demonstration as needed of appropriate use of medications, inhalers, and oxygen therapy.   Expected Outcomes Short Term: Achieves understanding  of medications use. Understands that oxygen is a medication prescribed by physician. Demonstrates appropriate use of inhaler and oxygen therapy.   Hypertension Yes   Intervention Provide education on lifestyle modifcations including regular physical activity/exercise, weight management, moderate sodium restriction and increased consumption of fresh fruit, vegetables, and low fat dairy, alcohol moderation, and smoking cessation.;Monitor prescription use compliance.   Expected Outcomes Short Term: Continued assessment and intervention until BP is < 140/34mm HG in hypertensive participants. < 130/35mm HG in hypertensive participants with diabetes, heart failure or chronic kidney disease.;Long Term: Maintenance of blood pressure at goal levels.   Lipids Yes   Intervention Provide education and support for participant on nutrition & aerobic/resistive exercise along with prescribed medications to achieve LDL 70mg , HDL >40mg .   Expected Outcomes Short Term: Participant states understanding of desired cholesterol values and is compliant with medications prescribed. Participant is following exercise prescription and nutrition guidelines.;Long Term: Cholesterol controlled with medications as prescribed, with individualized exercise RX and with personalized nutrition plan. Value goals: LDL < 70mg , HDL > 40 mg.      Tobacco Use Initial Evaluation: History  Smoking Status   Former Smoker   Quit date: 10/14/2015  Smokeless Tobacco   Former User    Copy of goals given to participant.

## 2016-10-13 NOTE — Progress Notes (Signed)
Primary Care Physician: Lavonne Chick, MD  Primary Gastroenterologist:  Dr. [name]   Chief Complaint  Patient presents with  . Follow up EGD results   HPI: Austin Patterson is a 60 y.o. male .He is here to follow up . He was admitted to the hospital on 09/21/16 and GI was consulted for coffee ground emesis with epigastric discomfort. He was on plavix at that time with asprin. EGD was performed on 12/6/17and was found to have Grade A esophagitis, hiatal hernia. He has a history of chronic alcohol abuyse, COPD,CAD   Since discharge doing well. No further passage of dark stool . Has some heartburn when he consumes certain foods such as apple pies/. On omeprazole 40 mg once daly . He takes it after meals.   Still consuming alcohol 3 drinks yesterday.    Current Outpatient Prescriptions  Medication Sig Dispense Refill  . albuterol (PROVENTIL HFA;VENTOLIN HFA) 108 (90 Base) MCG/ACT inhaler Inhale 2 puffs into the lungs every 4 (four) hours as needed for wheezing or shortness of breath. 3 Inhaler 3  . amLODipine (NORVASC) 10 MG tablet Take 1 tablet (10 mg total) by mouth daily. 90 tablet 3  . aspirin EC 81 MG tablet Take 1 tablet (81 mg total) by mouth daily. 90 tablet 3  . atorvastatin (LIPITOR) 40 MG tablet Take 1 tablet (40 mg total) by mouth at bedtime. 90 tablet 3  . calcium-vitamin D (OSCAL WITH D) 500-200 MG-UNIT tablet Take 2 tablets by mouth daily with breakfast. 60 tablet 0  . cetirizine (ZYRTEC) 10 MG tablet Take 10 mg by mouth at bedtime.    . clopidogrel (PLAVIX) 75 MG tablet Take 1 tablet (75 mg total) by mouth daily. 90 tablet 3  . losartan-hydrochlorothiazide (HYZAAR) 100-12.5 MG tablet Take 1 tablet by mouth daily. 90 tablet 3  . metoprolol succinate (TOPROL-XL) 50 MG 24 hr tablet Take 1 tablet (50 mg total) by mouth daily. 90 tablet 3  . Multiple Vitamin (MULTIVITAMIN WITH MINERALS) TABS tablet Take 1 tablet by mouth daily. 30 tablet 0  . pantoprazole (PROTONIX) 40  MG tablet Take 1 tablet (40 mg total) by mouth 2 (two) times daily. 90 tablet 3  . predniSONE (DELTASONE) 5 MG tablet Take 1.5 tablet daily (Patient taking differently: Take 7.5 mg by mouth daily. Take 1.5 tablet daily) 45 tablet 3  . umeclidinium-vilanterol (ANORO ELLIPTA) 62.5-25 MCG/INH AEPB INHALE ONE PUFF INTO THE LUNGS DAILY (Patient not taking: Reported on 10/13/2016) 90 each 1   No current facility-administered medications for this visit.     Allergies as of 10/13/2016  . (No Known Allergies)    ROS:  General: Negative for anorexia, weight loss, fever, chills, fatigue, weakness. ENT: Negative for hoarseness, difficulty swallowing , nasal congestion. CV: Negative for chest pain, angina, palpitations, dyspnea on exertion, peripheral edema.  Respiratory: Negative for dyspnea at rest, dyspnea on exertion, cough, sputum, wheezing.  GI: See history of present illness. GU:  Negative for dysuria, hematuria, urinary incontinence, urinary frequency, nocturnal urination.  Endo: Negative for unusual weight change.    Physical Examination:   BP (!) 149/83   Pulse (!) 114   Temp 97.8 F (36.6 C) (Oral)   Ht 5\' 7"  (1.702 m)   Wt 181 lb (82.1 kg)   BMI 28.35 kg/m   General: walks around with an oxygen cylinder. .  Eyes: No icterus. Conjunctivae pink. Mouth: Oropharyngeal mucosa moist and pink , no lesions erythema or exudate. Lungs: Clear to  auscultation bilaterally. Non-labored. Heart: Regular rate and rhythm, no murmurs rubs or gallops.  Abdomen: Bowel sounds are normal, nontender, nondistended, no hepatosplenomegaly or masses, no abdominal bruits or hernia , no rebound or guarding.   Extremities: No lower extremity edema. No clubbing or deformities. Neuro: Alert and oriented x 3.  Grossly intact. Skin: Warm and dry, no jaundice.   Psych: Alert and cooperative, normal mood and affect.  Imaging Studies: No results found.  Assessment and Plan:   Austin Patterson is a 60 y.o.  y/o male here today for follow up .   1. Reflux with esophagitis-- taking omeprazole 40 mg on an empty stomach daily a she is still having symptoms but takes his medication after meals.  Counseled on life style changes, suggest to use PPI first thing in the morning on empty stomach and eat 30 minutes after. Advised on the use of a wedge pillow at night , avoid meals for 2 hours prior to bed time.  Discussed the risks and benefits of long term PPI use including but not limited to bone loss, chronic kidney disease, infections , low magnesium . Aim to use at the lowest dose for the shortest period of time   2. History of alcohol abuse- advised to quit and join AA   Follow up in 6 months  Dr [name]  MD

## 2016-10-21 ENCOUNTER — Encounter: Payer: Medicaid Other | Attending: Internal Medicine

## 2016-10-21 DIAGNOSIS — J449 Chronic obstructive pulmonary disease, unspecified: Secondary | ICD-10-CM

## 2016-10-21 NOTE — Progress Notes (Signed)
Daily Session Note  Patient Details  Name: Austin Patterson MRN: [id number] Date of Birth: [birth year] Referring Provider:   Flowsheet Row Pulmonary Rehab from 10/13/2016 in [facility] Cardiac and Pulmonary Rehab  Referring Provider  Laverle Hobby MD      Encounter Date: 10/21/2016  Check In:     Session Check In - 10/21/16 1113      Check-In   Location [facility]-Cardiac & Pulmonary Rehab   Staff Present Carson Myrtle, BS, RRT, Respiratory Lennie Hummer, MA, ACSM RCEP, Exercise Physiologist;Jasaiah Karwowski Oletta Darter, BA, ACSM CEP, Exercise Physiologist   Supervising physician immediately available to respond to emergencies See telemetry face sheet for immediately available ER MD         Goals Met:  Proper associated with RPD/PD & O2 Sat Independence with exercise equipment Exercise tolerated well Strength training completed today  Goals Unmet:  Not Applicable  Comments: First full day of exercise!  Patient was oriented to gym and equipment including functions, settings, policies, and procedures.  Patient's individual exercise prescription and treatment plan were reviewed.  All starting workloads were established based on the results of the 6 minute walk test done at initial orientation visit.  The plan for exercise progression was also introduced and progression will be customized based on patient's performance and goals.    Dr. Emily Filbert is Medical Director for [facility] and [facility].

## 2016-10-26 ENCOUNTER — Encounter: Payer: Medicaid Other | Admitting: *Deleted

## 2016-10-26 DIAGNOSIS — J449 Chronic obstructive pulmonary disease, unspecified: Secondary | ICD-10-CM

## 2016-10-26 NOTE — Progress Notes (Signed)
Daily Session Note  Patient Details  Name: Austin Patterson MRN: [id number] Date of Birth: [birth year] Referring Provider:   Flowsheet Row Pulmonary Rehab from 10/13/2016 in [facility] Cardiac and Pulmonary Rehab  Referring Provider  Laverle Hobby MD      Encounter Date: 10/26/2016  Check In:     Session Check In - 10/26/16 1015      Check-In   Location [facility]-Cardiac & Pulmonary Rehab   Staff Present Carson Myrtle, BS, RRT, Respiratory Therapist;Kendrell Lottman Amedeo Plenty, BS, ACSM CEP, Exercise Physiologist;Amanda Oletta Darter, BA, ACSM CEP, Exercise Physiologist   Supervising physician immediately available to respond to emergencies [facility] immediately available ER MD   Physician(s) Drs. McShane and Stafford   Medication changes reported     No   Fall or balance concerns reported    No   Warm-up and Cool-down Performed on first and last piece of equipment   Resistance Training Performed Yes   VAD Patient? No     Pain Assessment   Currently in Pain? No/denies   Multiple Pain Sites No         Goals Met:  Proper associated with RPD/PD & O2 Sat Independence with exercise equipment Exercise tolerated well Personal goals reviewed Strength training completed today  Goals Unmet:  Not Applicable  Comments: Pt able to follow exercise prescription today without complaint.  Will continue to monitor for progression.    Dr. Emily Filbert is Medical Director for [place] and [facility].

## 2016-10-28 DIAGNOSIS — J449 Chronic obstructive pulmonary disease, unspecified: Secondary | ICD-10-CM | POA: Diagnosis not present

## 2016-10-28 NOTE — Progress Notes (Signed)
Daily Session Note  Patient Details  Name: Austin Patterson MRN: [id number] Date of Birth: August 02, 1956 Referring Provider:   Flowsheet Row Pulmonary Rehab from 10/13/2016 in [facility] Cardiac and Pulmonary Rehab  Referring Provider  Laverle Hobby MD      Encounter Date: 10/28/2016  Check In:     Session Check In - 10/28/16 1208      Check-In   Location [facility]-Cardiac & Pulmonary Rehab   Staff Present Carson Myrtle, BS, RRT, Respiratory Lennie Hummer, MA, ACSM RCEP, Exercise Physiologist;Amanda Oletta Darter, BA, ACSM CEP, Exercise Physiologist   Supervising physician immediately available to respond to emergencies [facility] immediately available ER MD   Physician(s) Darl Householder and Alfred Levins   Medication changes reported     No   Fall or balance concerns reported    No   Warm-up and Cool-down Performed as group-led Location manager Performed Yes   VAD Patient? No     Pain Assessment   Currently in Pain? No/denies         Goals Met:  Proper associated with RPD/PD & O2 Sat Independence with exercise equipment Exercise tolerated well Strength training completed today  Goals Unmet:  Not Applicable  Comments: Pt able to follow exercise prescription today without complaint.  Will continue to monitor for progression.    Dr. Emily Filbert is Medical Director for [place] and [facility].

## 2016-10-30 ENCOUNTER — Encounter: Payer: Medicaid Other | Admitting: *Deleted

## 2016-10-30 DIAGNOSIS — J449 Chronic obstructive pulmonary disease, unspecified: Secondary | ICD-10-CM | POA: Diagnosis not present

## 2016-10-30 NOTE — Progress Notes (Signed)
Daily Session Note  Patient Details  Name: Austin Patterson MRN: [id number] Date of Birth: 1956-04-08 Referring Provider:   Flowsheet Row Pulmonary Rehab from 10/13/2016 in [facility] Cardiac and Pulmonary Rehab  Referring Provider  Laverle Hobby MD      Encounter Date: 10/30/2016  Check In:     Session Check In - 10/30/16 1113      Check-In   Location [facility]-Cardiac & Pulmonary Rehab   Staff Present Gerlene Burdock, RN, Vickki Hearing, BA, ACSM CEP, Exercise Physiologist;Patricia Surles RN BSN   Supervising physician immediately available to respond to emergencies [facility] immediately available ER MD   Physician(s) Dr. Katy Fitch and Dr. Jimmye Norman   Medication changes reported     No   Fall or balance concerns reported    No   Warm-up and Cool-down Performed as group-led instruction   Resistance Training Performed Yes   VAD Patient? No     Pain Assessment   Currently in Pain? No/denies         Goals Met:  Proper associated with RPD/PD & O2 Sat Exercise tolerated well  Goals Unmet:  Not Applicable  Comments:     Dr. Emily Filbert is Medical Director for [place] and [facility].

## 2016-11-02 ENCOUNTER — Encounter: Payer: Self-pay | Admitting: Respiratory Therapy

## 2016-11-02 DIAGNOSIS — J449 Chronic obstructive pulmonary disease, unspecified: Secondary | ICD-10-CM

## 2016-11-02 NOTE — Progress Notes (Signed)
Pulmonary Individual Treatment Plan  Patient Details  Name: Austin Patterson MRN: [id number] Date of Birth: 05-22-1956 Referring Provider:   Flowsheet Row Pulmonary Rehab from 10/13/2016 in [facility] Cardiac and Pulmonary Rehab  Referring Provider  Laverle Hobby MD      Initial Encounter Date:  Flowsheet Row Pulmonary Rehab from 10/13/2016 in [facility] Cardiac and Pulmonary Rehab  Date  10/13/16  Referring Provider  Laverle Hobby MD      Visit Diagnosis: COPD, moderate ([place])  Patient's Home Medications on Admission:  Current Outpatient Prescriptions:    albuterol (PROVENTIL HFA;VENTOLIN HFA) 108 (90 Base) MCG/ACT inhaler, Inhale 2 puffs into the lungs every 4 (four) hours as needed for wheezing or shortness of breath., Disp: 3 Inhaler, Rfl: 3   amLODipine (NORVASC) 10 MG tablet, Take 1 tablet (10 mg total) by mouth daily., Disp: 90 tablet, Rfl: 3   aspirin EC 81 MG tablet, Take 1 tablet (81 mg total) by mouth daily., Disp: 90 tablet, Rfl: 3   atorvastatin (LIPITOR) 40 MG tablet, Take 1 tablet (40 mg total) by mouth at bedtime., Disp: 90 tablet, Rfl: 3   calcium-vitamin D (OSCAL WITH D) 500-200 MG-UNIT tablet, Take 2 tablets by mouth daily with breakfast., Disp: 60 tablet, Rfl: 0   cetirizine (ZYRTEC) 10 MG tablet, Take 10 mg by mouth at bedtime., Disp: , Rfl:    clopidogrel (PLAVIX) 75 MG tablet, Take 1 tablet (75 mg total) by mouth daily., Disp: 90 tablet, Rfl: 3   losartan-hydrochlorothiazide (HYZAAR) 100-12.5 MG tablet, Take 1 tablet by mouth daily., Disp: 90 tablet, Rfl: 3   metoprolol succinate (TOPROL-XL) 50 MG 24 hr tablet, Take 1 tablet (50 mg total) by mouth daily., Disp: 90 tablet, Rfl: 3   Multiple Vitamin (MULTIVITAMIN WITH MINERALS) TABS tablet, Take 1 tablet by mouth daily., Disp: 30 tablet, Rfl: 0   pantoprazole (PROTONIX) 40 MG tablet, Take 1 tablet (40 mg total) by mouth 2 (two) times daily., Disp: 90 tablet, Rfl: 3   predniSONE (DELTASONE) 5 MG  tablet, Take 1.5 tablet daily (Patient taking differently: Take 7.5 mg by mouth daily. 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 Latest Ref Rng & Units 10/14/2015 12/09/2015   Hemoglobin A1c 4.0 - 6.0 % - 4.9   HCO3 21.0 - 28.0 mEq/L 29.9(H) -       ADL [facility]:     Pulmonary Assessment Scores    Row Name 10/13/16 1147         ADL [facility]   SOB Score total 38     Rest 0     Walk 3     Stairs 4     Bath 2     Dress 0     Shop 2        Pulmonary Function Assessment:     Pulmonary Function Assessment - 10/13/16 1146      Pulmonary Function Tests  RV% 68 %   DLCO% 26 %     Initial Spirometry Results   FVC% 61 %   FEV1% 52 %   FEV1/FVC Ratio 65   Comments Test date 01/29/16     Post Bronchodilator Spirometry Results   FVC% 61 %   FEV1% 52 %   FEV1/FVC Ratio 65     Breath   Shortness of Breath Yes;Limiting activity      Exercise Target Goals:    Exercise Program Goal: Individual exercise prescription set with THRR, safety & activity barriers. Participant demonstrates ability to understand and report RPE using BORG scale, to self-measure pulse accurately, and to acknowledge the importance of the exercise prescription.  Exercise Prescription Goal: Starting with aerobic activity 30 plus minutes a day, 3 days per week for initial exercise prescription. Provide home exercise prescription and guidelines  that participant acknowledges understanding prior to discharge.  Activity Barriers & Risk Stratification:     Activity Barriers & Cardiac Risk Stratification - 10/13/16 1145      Activity Barriers & Cardiac Risk Stratification   Activity Barriers Shortness of Breath;Deconditioning   Cardiac Risk Stratification Moderate      6 Minute Walk:     6 Minute Walk    Row Name 10/13/16 1155         6 Minute Walk   Phase Initial     Distance 1486 feet     Walk Time 6 minutes     # of Rest Breaks 0     MPH 2.81     METS 3.95     RPE 13     Perceived Dyspnea  3     VO2 Peak 13.81     Symptoms No     Resting HR 100 bpm     Resting BP 124/72     Max Ex. HR 117 bpm     Max Ex. BP 136/74     2 Minute Post BP 126/70       Interval HR   Baseline HR 100     1 Minute HR 109     2 Minute HR 112     3 Minute HR 113     4 Minute HR 113     5 Minute HR 117     6 Minute HR 116     2 Minute Post HR 99     Interval Heart Rate? Yes       Interval Oxygen   Interval Oxygen? Yes     Baseline Oxygen Saturation % 97 %     Baseline Liters of Oxygen 2 L     1 Minute Oxygen Saturation % 95 %     1 Minute Liters of Oxygen 2 L     2 Minute Oxygen Saturation % 98 %     2 Minute Liters of Oxygen 2 L     3 Minute Oxygen Saturation % 98 %     3 Minute Liters of Oxygen 2 L     4 Minute Oxygen Saturation % 95 %     4 Minute Liters of Oxygen 2 L     5 Minute Oxygen Saturation % 96 %     5 Minute Liters of Oxygen 2 L     6 Minute Oxygen Saturation % 96 %     6 Minute Liters of Oxygen 2 L     2 Minute Post Oxygen Saturation % 96 %     2 Minute Post  Liters of Oxygen 2 L        Initial Exercise Prescription:     Initial Exercise Prescription - 10/13/16 1100      Date of Initial Exercise RX and Referring Provider   Date 10/13/16   Referring Provider Laverle Hobby MD     Oxygen   Oxygen Continuous   Liters 2     Treadmill   MPH 2.5   Grade 0.5   Minutes 15   METs 3.09      NuStep   Level 3   Watts --  60-80 spm   Minutes 15   METs 2     Recumbant Elliptical   Level 2   RPM 50   Minutes 15   METs 2     Prescription Details   Frequency (times per week) 3   Duration Progress to 45 minutes of aerobic exercise without signs/symptoms of physical distress     Intensity   THRR 40-80% of Max Heartrate 124-148   Ratings of Perceived Exertion 11-15   Perceived Dyspnea 0-4     Progression   Progression Continue to progress workloads to maintain intensity without signs/symptoms of physical distress.     Resistance Training   Training Prescription Yes   Weight 3 lbs   Reps 10-15      Perform Capillary Blood Glucose checks as needed.  Exercise Prescription Changes:     Exercise Prescription Changes    Row Name 10/13/16 1100 10/22/16 1200           Exercise Review   Progression --  walk test results  --        Response to Exercise   Blood Pressure (Admit) 124/72 126/80      Blood Pressure (Exercise) 136/74 156/84      Blood Pressure (Exit) 126/70 126/70      Heart Rate (Admit) 100 bpm 109 bpm      Heart Rate (Exercise) 117 bpm 123 bpm      Heart Rate (Exit) 99 bpm 110 bpm      Oxygen Saturation (Admit) 97 % 95 %      Oxygen Saturation (Exercise) 95 % 95 %      Oxygen Saturation (Exit) 96 % 92 %      Rating of Perceived Exertion (Exercise) 13 13      Perceived Dyspnea (Exercise) 3 3      Symptoms none none        Progression   Progression  -- Continue to progress workloads to maintain intensity without signs/symptoms of physical distress.        Resistance Training   Training Prescription  -- Yes      Weight  -- 3      Reps  -- 10-15        Interval Training   Interval Training  -- No        Oxygen   Oxygen  -- Continuous      Liters  -- 2        Recumbant Elliptical   Level  -- 2      RPM  -- 48      Minutes  -- 15      METs  -- 1.9        T5 Nustep   Level  -- 3      Minutes  -- 15      METs  -- 1.9          Exercise Comments:  Exercise Comments    Row Name 10/13/16 1200 10/21/16 1357         Exercise Comments Austin Patterson wants to reduce the amount of oxygen he is wearing regularly. First full day of exercise!  Patient was oriented to gym and equipment including functions, settings, policies, and procedures.  Patient's individual exercise prescription and treatment plan were reviewed.  All starting workloads were established based on the results of the 6 minute walk test done at initial orientation visit.  The plan for exercise progression was also introduced and progression will be customized based on patient's performance and goals.         Discharge Exercise Prescription (Final Exercise Prescription Changes):     Exercise Prescription Changes - 10/22/16 1200      Response to Exercise   Blood Pressure (Admit) 126/80   Blood Pressure (Exercise) 156/84   Blood Pressure (Exit) 126/70   Heart Rate (Admit) 109 bpm   Heart Rate (Exercise) 123 bpm   Heart Rate (Exit) 110 bpm   Oxygen Saturation (Admit) 95 %   Oxygen Saturation (Exercise) 95 %   Oxygen Saturation (Exit) 92 %   Rating of Perceived Exertion (Exercise) 13   Perceived Dyspnea (Exercise) 3   Symptoms none     Progression   Progression Continue to progress workloads to maintain intensity without signs/symptoms of physical distress.     Resistance Training   Training Prescription Yes   Weight 3   Reps 10-15     Interval Training   Interval Training No     Oxygen   Oxygen Continuous   Liters 2     Recumbant Elliptical   Level 2   RPM 48   Minutes 15   METs 1.9     T5 Nustep   Level 3   Minutes 15   METs 1.9       Nutrition:  Target Goals: Understanding of nutrition guidelines, daily intake of sodium '1500mg'$ , cholesterol '200mg'$ , calories 30% from fat and 7% or less from saturated fats, daily to have 5 or more servings of fruits and vegetables.  Biometrics:     Pre Biometrics - 10/13/16 1200       Pre Biometrics   Height 5' 6.75" (1.695 m)   Weight 177 lb (80.3 kg)   Waist Circumference 38 inches   Hip Circumference 39.5 inches   Waist to Hip Ratio 0.96 %   BMI (Calculated) 28       Nutrition Therapy Plan and Nutrition Goals:   Nutrition Discharge: Rate Your Plate Scores:   Psychosocial: Target Goals: Acknowledge presence or absence of depression, maximize coping skills, provide positive support system. Participant is able to verbalize types and ability to use techniques and skills needed for reducing stress and depression.  Initial Review & Psychosocial Screening:     Initial Psych Review & Screening - 10/13/16 1153      Family Dynamics   Good Support System? Yes   Comments Austin Patterson does have good support from his friends. He is in the middle of a divorce from his wife of some twenty years. Austin Patterson states he misses going to a job and some activities he use to perform. He is excited about [facility] and improving his activity level.      Barriers   Psychosocial barriers to participate in program The patient should benefit from training in stress management and relaxation.     Screening Interventions   Interventions Encouraged to exercise;Program counselor consult  Quality of Life Scores:     Quality of Life - 10/13/16 1157      Quality of Life Scores   Health/Function Pre 20.69 %   Socioeconomic Pre 19.14 %   Psych/Spiritual Pre 20.86 %   Family Pre 19.6 %   GLOBAL Pre 20.26 %      PHQ-9: Recent Review Flowsheet Data    Depression screen [facility] 2/9 10/13/2016   Decreased Interest 3   Down, Depressed, Hopeless 2   PHQ - 2 Score 5   Altered sleeping 0   Tired, decreased energy 3   Change in appetite 3   Feeling bad or failure about yourself  0   Trouble concentrating 3   Moving slowly or fidgety/restless 0   Suicidal thoughts 1   PHQ-9 Score 15   Difficult doing work/chores Extremely dIfficult      Psychosocial Evaluation and  Intervention:     Psychosocial Evaluation - 10/28/16 1200      Psychosocial Evaluation & Interventions   Interventions Encouraged to exercise with the program and follow exercise prescription;Relaxation education;Stress management education   Comments Counselor met with Austin Patterson today for initial psychosocial evaluation.  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  Austin Patterson has a limited support system as he reports he is in the midst of separating from his spouse of 10 years.  H has some close friends within the community that are supportive and helpful in his life.  Austin Patterson states he sleeps okay and has a good appetite.  He denies a history of depression or anxiety or current symptoms; although his PHQ-9 scores were "15" at the time of admission.  counselor processed this with Austin. Patterson reporting the Steroids he was on impacted his sleep and appetite and since coming into this program he feels more motivated and more energy.  Counselor encouraged him to consistently exercise since he has already experienced such positive benefits in a short time.  Austin Patterson also states his mood is positive currently because he is making some healthier choices for his life with the separation and with his health and working out.  Austin Patterson has goals to breathe better and possibly get off the oxygen at least during the day.  Staff will continue to follow with Austin. Patterson throughout the course of this program.  And Counselor will follow on his depressive symptoms to see if consistency in exercise Continues to improve this as it already has.        Psychosocial Re-Evaluation:  Education: Education Goals: Education classes will be provided on a weekly basis, covering required topics. Participant will state understanding/return demonstration of topics presented.  Learning Barriers/Preferences:     Learning Barriers/Preferences - 10/13/16 1146      Learning Barriers/Preferences   Learning Barriers None    Learning Preferences None      Education Topics: Initial Evaluation Education: - Verbal, written and demonstration of respiratory meds, RPE/PD scales, oximetry and breathing techniques. Instruction on use of nebulizers and MDIs: cleaning and proper use, rinsing mouth with steroid doses and importance of monitoring MDI activations. Flowsheet Row Pulmonary Rehab from 10/28/2016 in [facility] Cardiac and Pulmonary Rehab  Date  10/13/16  Educator  LB  Instruction Review Code  2- meets goals/outcomes      General Nutrition Guidelines/Fats and Fiber: -Group instruction provided by verbal, written material, models and posters to present the general guidelines for heart healthy nutrition. Gives an  explanation and review of dietary fats and fiber.   Controlling Sodium/Reading Food Labels: -Group verbal and written material supporting the discussion of sodium use in heart healthy nutrition. Review and explanation with models, verbal and written materials for utilization of the food label.   Exercise Physiology & Risk Factors: - Group verbal and written instruction with models to review the exercise physiology of the cardiovascular system and associated critical values. Details cardiovascular disease risk factors and the goals associated with each risk factor. Flowsheet Row Pulmonary Rehab from 10/28/2016 in [facility] Cardiac and Pulmonary Rehab  Date  10/21/16  Educator  [facility]  Instruction Review Code  2- meets goals/outcomes      Aerobic Exercise & Resistance Training: - Gives group verbal and written discussion on the health impact of inactivity. On the components of aerobic and resistive training programs and the benefits of this training and how to safely progress through these programs.   Flexibility, Balance, General Exercise Guidelines: - Provides group verbal and written instruction on the benefits of flexibility and balance training programs. Provides general exercise guidelines with specific  guidelines to those with heart or lung disease. Demonstration and skill practice provided.   Stress Management: - Provides group verbal and written instruction about the health risks of elevated stress, cause of high stress, and healthy ways to reduce stress.   Depression: - Provides group verbal and written instruction on the correlation between heart/lung disease and depressed mood, treatment options, and the stigmas associated with seeking treatment.   Exercise & Equipment Safety: - Individual verbal instruction and demonstration of equipment use and safety with use of the equipment.   Infection Prevention: - Provides verbal and written material to individual with discussion of infection control including proper hand washing and proper equipment cleaning during exercise session. Flowsheet Row Pulmonary Rehab from 10/28/2016 in [facility] Cardiac and Pulmonary Rehab  Date  10/13/16  Educator  LB  Instruction Review Code  2- meets goals/outcomes      Falls Prevention: - Provides verbal and written material to individual with discussion of falls prevention and safety. Flowsheet Row Pulmonary Rehab from 10/28/2016 in [facility] Cardiac and Pulmonary Rehab  Date  10/13/16  Educator  LB  Instruction Review Code  2- meets goals/outcomes      Diabetes: - Individual verbal and written instruction to review signs/symptoms of diabetes, desired ranges of glucose level fasting, after meals and with exercise. Advice that pre and post exercise glucose checks will be done for 3 sessions at entry of program.   Chronic Lung Diseases: - Group verbal and written instruction to review new updates, new respiratory medications, new advancements in procedures and treatments. Provide informative websites and "800" numbers of self-education.   Lung Procedures: - Group verbal and written instruction to describe testing methods done to diagnose lung disease. Review the outcome of test results. Describe the  treatment choices: Pulmonary Function Tests, ABGs and oximetry.   Energy Conservation: - Provide group verbal and written instruction for methods to conserve energy, plan and organize activities. Instruct on pacing techniques, use of adaptive equipment and posture/positioning to relieve shortness of breath.   Triggers: - Group verbal and written instruction to review types of environmental controls: home humidity, furnaces, filters, dust mite/pet prevention, HEPA vacuums. To discuss weather changes, air quality and the benefits of nasal washing.   Exacerbations: - Group verbal and written instruction to provide: warning signs, infection symptoms, calling MD promptly, preventive modes, and value of vaccinations. Review: effective airway clearance, coughing  and/or vibration techniques. Create an Sports administrator.   Oxygen: - Individual and group verbal and written instruction on oxygen therapy. Includes supplement oxygen, available portable oxygen systems, continuous and intermittent flow rates, oxygen safety, concentrators, and Medicare reimbursement for oxygen. Flowsheet Row Pulmonary Rehab from 10/28/2016 in [facility] Cardiac and Pulmonary Rehab  Date  10/13/16  Educator  LB      Respiratory Medications: - Group verbal and written instruction to review medications for lung disease. Drug class, frequency, complications, importance of spacers, rinsing mouth after steroid MDI's, and proper cleaning methods for nebulizers. Flowsheet Row Pulmonary Rehab from 10/28/2016 in [facility] Cardiac and Pulmonary Rehab  Date  10/13/16  Educator  LB  Instruction Review Code  2- meets goals/outcomes      AED/CPR: - Group verbal and written instruction with the use of models to demonstrate the basic use of the AED with the basic ABC's of resuscitation.   Breathing Retraining: - Provides individuals verbal and written instruction on purpose, frequency, and proper technique of diaphragmatic breathing and  pursed-lipped breathing. Applies individual practice skills. Flowsheet Row Pulmonary Rehab from 10/28/2016 in [facility] Cardiac and Pulmonary Rehab  Date  10/13/16  Educator  LB  Instruction Review Code  2- meets goals/outcomes      Anatomy and Physiology of the Lungs: - Group verbal and written instruction with the use of models to provide basic lung anatomy and physiology related to function, structure and complications of lung disease.   Heart Failure: - Group verbal and written instruction on the basics of heart failure: signs/symptoms, treatments, explanation of ejection fraction, enlarged heart and cardiomyopathy.   Sleep Apnea: - Individual verbal and written instruction to review Obstructive Sleep Apnea. Review of risk factors, methods for diagnosing and types of masks and machines for OSA.   Anxiety: - Provides group, verbal and written instruction on the correlation between heart/lung disease and anxiety, treatment options, and management of anxiety. Flowsheet Row Pulmonary Rehab from 10/28/2016 in [facility] Cardiac and Pulmonary Rehab  Date  10/28/16  Educator  [facility]  Instruction Review Code  2- Meets goals/outcomes      Relaxation: - Provides group, verbal and written instruction about the benefits of relaxation for patients with heart/lung disease. Also provides patients with examples of relaxation techniques.   Knowledge Questionnaire Score:     Knowledge Questionnaire Score - 10/13/16 1146      Knowledge Questionnaire Score   Pre Score 7/10       Core Components/Risk Factors/Patient Goals at Admission:     Personal Goals and Risk Factors at Admission - 10/13/16 1150      Core Components/Risk Factors/Patient Goals on Admission    Weight Management Yes;Weight Loss   Intervention Weight Management: Develop a combined nutrition and exercise program designed to reach desired caloric intake, while maintaining appropriate intake of nutrient and fiber, sodium and fats, and  appropriate energy expenditure required for the weight goal.;Weight Management: Provide education and appropriate resources to help participant work on and attain dietary goals.   Admit Weight 177 lb (80.3 kg)   Goal Weight: Short Term 172 lb (78 kg)   Goal Weight: Long Term 167 lb (75.8 kg)   Expected Outcomes Short Term: Continue to assess and modify interventions until short term weight is achieved;Long Term: Adherence to nutrition and physical activity/exercise program aimed toward attainment of established weight goal;Weight Loss: Understanding of general recommendations for a balanced deficit meal plan, which promotes 1-2 lb weight loss per week and includes a negative  energy balance of [phone] kcal/d;Understanding recommendations for meals to include 15-35% energy as protein, 25-35% energy from fat, 35-60% energy from carbohydrates, less than '200mg'$  of dietary cholesterol, 20-35 gm of total fiber daily;Understanding of distribution of calorie intake throughout the day with the consumption of 4-5 meals/snacks   Sedentary Yes   Intervention Provide advice, education, support and counseling about physical activity/exercise needs.;Develop an individualized exercise prescription for aerobic and resistive training based on initial evaluation findings, risk stratification, comorbidities and participant's personal goals.   Expected Outcomes Achievement of increased cardiorespiratory fitness and enhanced flexibility, muscular endurance and strength shown through measurements of functional capacity and personal statement of participant.   Increase Strength and Stamina Yes   Intervention Provide advice, education, support and counseling about physical activity/exercise needs.;Develop an individualized exercise prescription for aerobic and resistive training based on initial evaluation findings, risk stratification, comorbidities and participant's personal goals.   Expected Outcomes Achievement of increased  cardiorespiratory fitness and enhanced flexibility, muscular endurance and strength shown through measurements of functional capacity and personal statement of participant.   Improve shortness of breath with ADL's Yes   Intervention Provide education, individualized exercise plan and daily activity instruction to help decrease symptoms of SOB with activities of daily living.   Expected Outcomes Short Term: Achieves a reduction of symptoms when performing activities of daily living.   Develop more efficient breathing techniques such as purse lipped breathing and diaphragmatic breathing; and practicing self-pacing with activity Yes   Intervention Provide education, demonstration and support about specific breathing techniuqes utilized for more efficient breathing. Include techniques such as pursed lipped breathing, diaphragmatic breathing and self-pacing activity.   Expected Outcomes Short Term: Participant will be able to demonstrate and use breathing techniques as needed throughout daily activities.   Increase knowledge of respiratory medications and ability to use respiratory devices properly  Yes  ProAir MDI; oxygen 2-4l/m   Intervention Provide education and demonstration as needed of appropriate use of medications, inhalers, and oxygen therapy.   Expected Outcomes Short Term: Achieves understanding of medications use. Understands that oxygen is a medication prescribed by physician. Demonstrates appropriate use of inhaler and oxygen therapy.   Hypertension Yes   Intervention Provide education on lifestyle modifcations including regular physical activity/exercise, weight management, moderate sodium restriction and increased consumption of fresh fruit, vegetables, and low fat dairy, alcohol moderation, and smoking cessation.;Monitor prescription use compliance.   Expected Outcomes Short Term: Continued assessment and intervention until BP is < 140/48m HG in hypertensive participants. < 130/831mHG in  hypertensive participants with diabetes, heart failure or chronic kidney disease.;Long Term: Maintenance of blood pressure at goal levels.   Lipids Yes   Intervention Provide education and support for participant on nutrition & aerobic/resistive exercise along with prescribed medications to achieve LDL '70mg'$ , HDL >'40mg'$ .   Expected Outcomes Short Term: Participant states understanding of desired cholesterol values and is compliant with medications prescribed. Participant is following exercise prescription and nutrition guidelines.;Long Term: Cholesterol controlled with medications as prescribed, with individualized exercise RX and with personalized nutrition plan. Value goals: LDL < '70mg'$ , HDL > 40 mg.      Core Components/Risk Factors/Patient Goals Review:      Goals and Risk Factor Review    Row Name 10/21/16 1357 10/26/16 1113           Core Components/Risk Factors/Patient Goals Review   Personal Goals Review Develop more efficient breathing techniques such as purse lipped breathing and diaphragmatic breathing and practicing self-pacing with activity. Develop more  efficient breathing techniques such as purse lipped breathing and diaphragmatic breathing and practicing self-pacing with activity.;Increase Strength and Stamina      Review Reviewed pursed lip breathing technique with Austin Patterson today. Patient tolerated his first session well and stated that it didn'Patterson bother him the next day. He has been using PLB and stated that it is helping him a little.       Expected Outcomes Austin Patterson will become more profieicent at using PLB and will eventually become independent in using PLB without queuing. Austin Patterson will attend class regularly and continue to gain increases in strength and stamina. He will continue to use PLB to help control SOB.          Core Components/Risk Factors/Patient Goals at Discharge (Final Review):      Goals and Risk Factor Review - 10/26/16 1113      Core Components/Risk  Factors/Patient Goals Review   Personal Goals Review Develop more efficient breathing techniques such as purse lipped breathing and diaphragmatic breathing and practicing self-pacing with activity.;Increase Strength and Stamina   Review Patient tolerated his first session well and stated that it didn'Patterson bother him the next day. He has been using PLB and stated that it is helping him a little.    Expected Outcomes Austin Patterson will attend class regularly and continue to gain increases in strength and stamina. He will continue to use PLB to help control SOB.       ITP Comments:   Comments: 30 day note review

## 2016-11-03 ENCOUNTER — Telehealth: Payer: Self-pay | Admitting: *Deleted

## 2016-11-03 NOTE — Telephone Encounter (Signed)
Initiated PA thru [place] tracks for [employer].  Pt ID: [id number] T Approved until 10/29/17 Ref# [id number]  Pharmacy informed.

## 2016-11-11 DIAGNOSIS — J449 Chronic obstructive pulmonary disease, unspecified: Secondary | ICD-10-CM

## 2016-11-11 NOTE — Progress Notes (Signed)
Daily Session Note  Patient Details  Name: ARSENIO SCHNORR MRN: [id number] Date of Birth: 29-Jun-1956 Referring Provider:   Flowsheet Row Pulmonary Rehab from 10/13/2016 in [facility] Cardiac and Pulmonary Rehab  Referring Provider  Laverle Hobby MD      Encounter Date: 11/11/2016  Check In:     Session Check In - 11/11/16 1223      Check-In   Location [facility]-Cardiac & Pulmonary Rehab   Staff Present Alberteen Sam, MA, ACSM RCEP, Exercise Physiologist;Shuan Statzer Oletta Darter, BA, ACSM CEP, Exercise Physiologist;Laureen Owens Shark, BS, RRT, Respiratory Therapist   Supervising physician immediately available to respond to emergencies [facility] immediately available ER MD   Physician(s) Cinda Quest and [name]   Medication changes reported     No   Fall or balance concerns reported    No   Warm-up and Cool-down Performed as group-led Location manager Performed Yes   VAD Patient? No     Pain Assessment   Currently in Pain? No/denies         Goals Met:  Proper associated with RPD/PD & O2 Sat Independence with exercise equipment Exercise tolerated well Strength training completed today  Goals Unmet:  Not Applicable  Comments: Pt able to follow exercise prescription today without complaint.  Will continue to monitor for progression.    Dr. Emily Filbert is Medical Director for [place] and [facility].

## 2016-11-13 ENCOUNTER — Encounter: Payer: Medicaid Other | Admitting: *Deleted

## 2016-11-13 DIAGNOSIS — J449 Chronic obstructive pulmonary disease, unspecified: Secondary | ICD-10-CM | POA: Diagnosis not present

## 2016-11-13 NOTE — Progress Notes (Signed)
Daily Session Note  Patient Details  Name: Austin Patterson MRN: [id number] Date of Birth: [birth year] Referring Provider:   Flowsheet Row Pulmonary Rehab from 10/13/2016 in [facility] Cardiac and Pulmonary Rehab  Referring Provider  Laverle Hobby MD      Encounter Date: 11/13/2016  Check In:     Session Check In - 11/13/16 1034      Check-In   Location [facility]-Cardiac & Pulmonary Rehab   Staff Present Gerlene Burdock, RN, Vickki Hearing, BA, ACSM CEP, Exercise Physiologist;Cleophus Mendonsa RN BSN   Supervising physician immediately available to respond to emergencies [facility] immediately available ER MD   Physician(s) Drs. Quentin Cornwall and Paduchowski   Medication changes reported     No   Fall or balance concerns reported    No   Warm-up and Cool-down Performed as group-led Location manager Performed Yes   VAD Patient? No     Pain Assessment   Currently in Pain? No/denies   Multiple Pain Sites No         Goals Met:  Proper associated with RPD/PD & O2 Sat Independence with exercise equipment Using PLB without cueing & demonstrates good technique Exercise tolerated well No report of cardiac concerns or symptoms Strength training completed today  Goals Unmet:  Not Applicable  Comments: Pt able to follow exercise prescription today without complaint.  Will continue to monitor for progression.    Dr. Emily Filbert is Medical Director for [place] and [facility].

## 2016-11-18 ENCOUNTER — Encounter: Payer: Medicaid Other | Admitting: *Deleted

## 2016-11-18 DIAGNOSIS — J449 Chronic obstructive pulmonary disease, unspecified: Secondary | ICD-10-CM

## 2016-11-18 NOTE — Progress Notes (Signed)
Daily Session Note  Patient Details  Name: Austin Patterson MRN: [id number] Date of Birth: 1956-08-16 Referring Provider:   Flowsheet Row Pulmonary Rehab from 10/13/2016 in [facility] Cardiac and Pulmonary Rehab  Referring Provider  Laverle Hobby MD      Encounter Date: 11/18/2016  Check In:     Session Check In - 11/18/16 1138      Check-In   Location [facility]-Cardiac & Pulmonary Rehab   Staff Present Carson Myrtle, BS, RRT, Respiratory Lennie Hummer, MA, ACSM RCEP, Exercise Physiologist;Amanda Oletta Darter, BA, ACSM CEP, Exercise Physiologist;Yaroslav Gombos RN BSN   Supervising physician immediately available to respond to emergencies [facility] immediately available ER MD   Physician(s) Drs. Malinda and Williams   Medication changes reported     No   Fall or balance concerns reported    No   Warm-up and Cool-down Performed as group-led Location manager Performed Yes   VAD Patient? No     Pain Assessment   Currently in Pain? No/denies   Multiple Pain Sites No         Goals Met:  Proper associated with RPD/PD & O2 Sat Independence with exercise equipment Using PLB without cueing & demonstrates good technique Exercise tolerated well Strength training completed today  Goals Unmet:  Not Applicable  Comments: Pt able to follow exercise prescription today without complaint.  Will continue to monitor for progression.  Reviewed home exercises with patient today. Patient plans to walk at home for exercise. Also discussed RPE, pulse, THR, restrictions, s/sx when to call MD, and weather considerations. He verbalized understanding.    Dr. Emily Filbert is Medical Director for [facility] and [facility].

## 2016-11-20 ENCOUNTER — Encounter: Payer: Medicaid Other | Attending: Internal Medicine

## 2016-11-20 DIAGNOSIS — J449 Chronic obstructive pulmonary disease, unspecified: Secondary | ICD-10-CM | POA: Insufficient documentation

## 2016-11-23 DIAGNOSIS — J449 Chronic obstructive pulmonary disease, unspecified: Secondary | ICD-10-CM | POA: Diagnosis present

## 2016-11-23 NOTE — Progress Notes (Signed)
Daily Session Note  Patient Details  Name: Austin Patterson MRN: [id number] Date of Birth: 02-19-1956 Referring Provider:   Flowsheet Row Pulmonary Rehab from 10/13/2016 in [facility] Cardiac and Pulmonary Rehab  Referring Provider  Laverle Hobby MD      Encounter Date: 11/23/2016  Check In:     Session Check In - 11/23/16 1221      Check-In   Location [facility]-Cardiac & Pulmonary Rehab   Staff Present Carson Myrtle, BS, RRT, Respiratory Therapist;Kelly Amedeo Plenty, BS, ACSM CEP, Exercise Physiologist;Maymie Brunke Oletta Darter, BA, ACSM CEP, Exercise Physiologist   Supervising physician immediately available to respond to emergencies [facility] immediately available ER MD   Physician(s) Jimmye Norman and Corky Downs   Medication changes reported     No   Fall or balance concerns reported    No   Warm-up and Cool-down Performed as group-led Location manager Performed Yes   VAD Patient? No     Pain Assessment   Currently in Pain? No/denies   Multiple Pain Sites No         Goals Met:  Proper associated with RPD/PD & O2 Sat Independence with exercise equipment Exercise tolerated well Strength training completed today  Goals Unmet:  Not Applicable  Comments: Pt able to follow exercise prescription today without complaint.  Will continue to monitor for progression.    Dr. Emily Filbert is Medical Director for [place] and [facility].

## 2016-11-25 ENCOUNTER — Encounter: Payer: Medicaid Other | Admitting: *Deleted

## 2016-11-25 DIAGNOSIS — J449 Chronic obstructive pulmonary disease, unspecified: Secondary | ICD-10-CM

## 2016-11-25 NOTE — Progress Notes (Signed)
Daily Session Note  Patient Details  Name: Austin Patterson MRN: [id number] Date of Birth: 1956/02/26 Referring Provider:   Flowsheet Row Pulmonary Rehab from 10/13/2016 in [facility] Cardiac and Pulmonary Rehab  Referring Provider  Laverle Hobby MD      Encounter Date: 11/25/2016  Check In:     Session Check In - 11/25/16 1010      Check-In   Location [facility]-Cardiac & Pulmonary Rehab   Staff Present Alberteen Sam, MA, ACSM RCEP, Exercise Physiologist;Laureen Owens Shark, BS, RRT, Respiratory Dareen Piano, BA, ACSM CEP, Exercise Physiologist   Supervising physician immediately available to respond to emergencies [facility] immediately available ER MD   Physician(s) Drs. Malinda and Robinson   Medication changes reported     No   Fall or balance concerns reported    No   Warm-up and Cool-down Performed as group-led Location manager Performed Yes   VAD Patient? No     Pain Assessment   Currently in Pain? No/denies   Multiple Pain Sites No         Goals Met:  Proper associated with RPD/PD & O2 Sat Independence with exercise equipment Using PLB without cueing & demonstrates good technique Exercise tolerated well Strength training completed today  Goals Unmet:  Not Applicable  Comments: Pt able to follow exercise prescription today without complaint.  Will continue to monitor for progression.    Dr. Emily Filbert is Medical Director for [place] and [facility].

## 2016-11-30 ENCOUNTER — Encounter: Payer: Self-pay | Admitting: Respiratory Therapy

## 2016-11-30 DIAGNOSIS — J449 Chronic obstructive pulmonary disease, unspecified: Secondary | ICD-10-CM

## 2016-11-30 NOTE — Progress Notes (Signed)
Pulmonary Individual Treatment Plan  Patient Details  Name: Austin Patterson MRN: [id number] Date of Birth: 05-22-1956 Referring Provider:   Flowsheet Row Pulmonary Rehab from 10/13/2016 in [facility] Cardiac and Pulmonary Rehab  Referring Provider  Laverle Hobby MD      Initial Encounter Date:  Flowsheet Row Pulmonary Rehab from 10/13/2016 in [facility] Cardiac and Pulmonary Rehab  Date  10/13/16  Referring Provider  Laverle Hobby MD      Visit Diagnosis: COPD, moderate ([place])  Patient's Home Medications on Admission:  Current Outpatient Prescriptions:    albuterol (PROVENTIL HFA;VENTOLIN HFA) 108 (90 Base) MCG/ACT inhaler, Inhale 2 puffs into the lungs every 4 (four) hours as needed for wheezing or shortness of breath., Disp: 3 Inhaler, Rfl: 3   amLODipine (NORVASC) 10 MG tablet, Take 1 tablet (10 mg total) by mouth daily., Disp: 90 tablet, Rfl: 3   aspirin EC 81 MG tablet, Take 1 tablet (81 mg total) by mouth daily., Disp: 90 tablet, Rfl: 3   atorvastatin (LIPITOR) 40 MG tablet, Take 1 tablet (40 mg total) by mouth at bedtime., Disp: 90 tablet, Rfl: 3   calcium-vitamin D (OSCAL WITH D) 500-200 MG-UNIT tablet, Take 2 tablets by mouth daily with breakfast., Disp: 60 tablet, Rfl: 0   cetirizine (ZYRTEC) 10 MG tablet, Take 10 mg by mouth at bedtime., Disp: , Rfl:    clopidogrel (PLAVIX) 75 MG tablet, Take 1 tablet (75 mg total) by mouth daily., Disp: 90 tablet, Rfl: 3   losartan-hydrochlorothiazide (HYZAAR) 100-12.5 MG tablet, Take 1 tablet by mouth daily., Disp: 90 tablet, Rfl: 3   metoprolol succinate (TOPROL-XL) 50 MG 24 hr tablet, Take 1 tablet (50 mg total) by mouth daily., Disp: 90 tablet, Rfl: 3   Multiple Vitamin (MULTIVITAMIN WITH MINERALS) TABS tablet, Take 1 tablet by mouth daily., Disp: 30 tablet, Rfl: 0   pantoprazole (PROTONIX) 40 MG tablet, Take 1 tablet (40 mg total) by mouth 2 (two) times daily., Disp: 90 tablet, Rfl: 3   predniSONE (DELTASONE) 5 MG  tablet, Take 1.5 tablet daily (Patient taking differently: Take 7.5 mg by mouth daily. 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 Latest Ref Rng & Units 10/14/2015 12/09/2015   Hemoglobin A1c 4.0 - 6.0 % - 4.9   HCO3 21.0 - 28.0 mEq/L 29.9(H) -       ADL [facility]:     Pulmonary Assessment Scores    Row Name 10/13/16 1147         ADL [facility]   SOB Score total 38     Rest 0     Walk 3     Stairs 4     Bath 2     Dress 0     Shop 2        Pulmonary Function Assessment:     Pulmonary Function Assessment - 10/13/16 1146      Pulmonary Function Tests  RV% 68 %   DLCO% 26 %     Initial Spirometry Results   FVC% 61 %   FEV1% 52 %   FEV1/FVC Ratio 65   Comments Test date 01/29/16     Post Bronchodilator Spirometry Results   FVC% 61 %   FEV1% 52 %   FEV1/FVC Ratio 65     Breath   Shortness of Breath Yes;Limiting activity      Exercise Target Goals:    Exercise Program Goal: Individual exercise prescription set with THRR, safety & activity barriers. Participant demonstrates ability to understand and report RPE using BORG scale, to self-measure pulse accurately, and to acknowledge the importance of the exercise prescription.  Exercise Prescription Goal: Starting with aerobic activity 30 plus minutes a day, 3 days per week for initial exercise prescription. Provide home exercise prescription and guidelines  that participant acknowledges understanding prior to discharge.  Activity Barriers & Risk Stratification:     Activity Barriers & Cardiac Risk Stratification - 10/13/16 1145      Activity Barriers & Cardiac Risk Stratification   Activity Barriers Shortness of Breath;Deconditioning   Cardiac Risk Stratification Moderate      6 Minute Walk:     6 Minute Walk    Row Name 10/13/16 1155         6 Minute Walk   Phase Initial     Distance 1486 feet     Walk Time 6 minutes     # of Rest Breaks 0     MPH 2.81     METS 3.95     RPE 13     Perceived Dyspnea  3     VO2 Peak 13.81     Symptoms No     Resting HR 100 bpm     Resting BP 124/72     Max Ex. HR 117 bpm     Max Ex. BP 136/74     2 Minute Post BP 126/70       Interval HR   Baseline HR 100     1 Minute HR 109     2 Minute HR 112     3 Minute HR 113     4 Minute HR 113     5 Minute HR 117     6 Minute HR 116     2 Minute Post HR 99     Interval Heart Rate? Yes       Interval Oxygen   Interval Oxygen? Yes     Baseline Oxygen Saturation % 97 %     Baseline Liters of Oxygen 2 L     1 Minute Oxygen Saturation % 95 %     1 Minute Liters of Oxygen 2 L     2 Minute Oxygen Saturation % 98 %     2 Minute Liters of Oxygen 2 L     3 Minute Oxygen Saturation % 98 %     3 Minute Liters of Oxygen 2 L     4 Minute Oxygen Saturation % 95 %     4 Minute Liters of Oxygen 2 L     5 Minute Oxygen Saturation % 96 %     5 Minute Liters of Oxygen 2 L     6 Minute Oxygen Saturation % 96 %     6 Minute Liters of Oxygen 2 L     2 Minute Post Oxygen Saturation % 96 %     2 Minute Post  Liters of Oxygen 2 L        Initial Exercise Prescription:     Initial Exercise Prescription - 11/06/16 0900      Oxygen   Oxygen Continuous   Liters 2     Treadmill   MPH 1.5   Grade 0.5   Minutes 15   METs 2.25     T5 Nustep   Level 3   Minutes 15   METs 2     Prescription Details   Frequency (times per week) 3   Duration  Progress to 45 minutes of aerobic exercise without signs/symptoms of physical distress     Intensity   THRR 40-80% of Max Heartrate 124-148   Ratings of Perceived Exertion 11-15   Perceived Dyspnea 0-4     Progression   Progression Continue to progress workloads to maintain intensity without signs/symptoms of physical distress.     Resistance Training   Training Prescription Yes   Weight 3   Reps 10-15      Perform Capillary Blood Glucose checks as needed.  Exercise Prescription Changes:     Exercise Prescription Changes    Row Name 10/13/16 1100 10/22/16 1200 11/18/16 1200 11/19/16 1200       Exercise Review   Progression --  walk test results  --  --  --      Response to Exercise   Blood Pressure (Admit) 124/72 126/80  -- 110/64    Blood Pressure (Exercise) 136/74 156/84  -- 138/64    Blood Pressure (Exit) 126/70 126/70  -- 150/82    Heart Rate (Admit) 100 bpm 109 bpm  -- 101 bpm    Heart Rate (Exercise) 117 bpm 123 bpm  -- 117 bpm    Heart Rate (Exit) 99 bpm 110 bpm  -- 91 bpm    Oxygen Saturation (Admit) 97 % 95 %  -- 96 %    Oxygen Saturation (Exercise) 95 % 95 %  -- 93 %    Oxygen Saturation (Exit) 96 % 92 %  -- 95 %    Rating of Perceived Exertion (Exercise) 13 13  -- 13    Perceived Dyspnea (Exercise) 3 3  -- 1    Symptoms none none  -- none    Comments  --  -- Home exercise guidelines given 11/18/2016  --      Progression   Progression  -- Continue to progress workloads to maintain intensity without signs/symptoms of physical distress.  -- Continue to progress workloads to maintain intensity without signs/symptoms of physical distress.      Resistance Training   Training Prescription  -- Yes  -- Yes    Weight  -- 3  -- 3    Reps  -- 10-15  --  --      Interval Training   Interval Training  -- No  -- No      Oxygen   Oxygen  -- Continuous  -- Continuous    Liters  -- 2  -- 2      Treadmill   MPH  --  --  -- 1.5    Grade  --  --  -- 0.5    Minutes   --  --  -- 15    METs  --  --  -- 2.25      Recumbant Elliptical   Level  -- 2  -- 2    RPM  -- 48  -- 39    Minutes  --  15  -- 15    METs  -- 1.9  -- 1.8      T5 Nustep   Level  -- 3  --  --    Minutes  -- 15  --  --    METs  -- 1.9  --  --      Home Exercise Plan   Plans to continue exercise at  --  -- Home  Walking  --    Frequency  --  -- Add 2 additional days to program exercise sessions.  --       Exercise Comments:     Exercise Comments    Row Name 10/13/16 1200 10/21/16 1357 11/06/16 0926 11/18/16 1241 11/19/16 1258   Exercise Comments Rusty wants to reduce the amount of oxygen he is wearing regularly. First full day of exercise!  Patient was oriented to gym and equipment including functions, settings, policies, and procedures.  Patient's individual exercise prescription and treatment plan were reviewed.  All starting workloads were established based on the results of the 6 minute walk test done at initial orientation visit.  The plan for exercise progression was also introduced and progression will be customized based on patient's performance and goals. Giovanne is progressing well with exercise. Reviewed home exercises with patient today. Patient plans to walk at home for exercise. Also discussed RPE, pulse, THR, restrictions, s/sx when to call MD, and weather considerations. He verbalized understanding.  Rusty is toleratiing exercise well and staff will monitor his increase in workloads.      Discharge Exercise Prescription (Final Exercise Prescription Changes):     Exercise Prescription Changes - 11/19/16 1200      Response to Exercise   Blood Pressure (Admit) 110/64   Blood Pressure (Exercise) 138/64   Blood Pressure (Exit) 150/82   Heart Rate (Admit) 101 bpm   Heart Rate (Exercise) 117 bpm   Heart Rate (Exit) 91 bpm   Oxygen Saturation (Admit) 96 %   Oxygen Saturation (Exercise) 93 %   Oxygen Saturation (Exit) 95 %   Rating of Perceived Exertion (Exercise) 13    Perceived Dyspnea (Exercise) 1   Symptoms none     Progression   Progression Continue to progress workloads to maintain intensity without signs/symptoms of physical distress.     Resistance Training   Training Prescription Yes   Weight 3     Interval Training   Interval Training No     Oxygen   Oxygen Continuous   Liters 2     Treadmill   MPH 1.5   Grade 0.5   Minutes 15   METs 2.25     Recumbant Elliptical   Level 2   RPM 39   Minutes 15   METs 1.8       Nutrition:  Target Goals: Understanding of nutrition guidelines, daily intake of sodium <1579m, cholesterol <2052m calories 30% from fat and 7% or less from saturated fats, daily to have 5 or more servings of fruits and vegetables.  Biometrics:     Pre Biometrics - 10/13/16 1200      Pre Biometrics   Height 5' 6.75" (1.695 m)   Weight 177 lb (80.3 kg)   Waist Circumference 38 inches   Hip Circumference 39.5 inches   Waist to Hip Ratio 0.96 %   BMI (Calculated) 28       Nutrition Therapy Plan and Nutrition Goals:   Nutrition Discharge: Rate Your Plate Scores:   Psychosocial: Target Goals: Acknowledge presence or  absence of depression, maximize coping skills, provide positive support system. Participant is able to verbalize types and ability to use techniques and skills needed for reducing stress and depression.  Initial Review & Psychosocial Screening:     Initial Psych Review & Screening - 10/13/16 1153      Family Dynamics   Good Support System? Yes   Comments Mr Apfel does have good support from his friends. He is in the middle of a divorce from his wife of some twenty years. Mr Boomhower states he misses going to a job and some activities he use to perform. He is excited about [facility] and improving his activity level.      Barriers   Psychosocial barriers to participate in program The patient should benefit from training in stress management and relaxation.     Screening  Interventions   Interventions Encouraged to exercise;Program counselor consult      Quality of Life Scores:     Quality of Life - 10/13/16 1157      Quality of Life Scores   Health/Function Pre 20.69 %   Socioeconomic Pre 19.14 %   Psych/Spiritual Pre 20.86 %   Family Pre 19.6 %   GLOBAL Pre 20.26 %      PHQ-9: Recent Review Flowsheet Data    Depression screen [facility] 2/9 10/13/2016   Decreased Interest 3   Down, Depressed, Hopeless 2   PHQ - 2 Score 5   Altered sleeping 0   Tired, decreased energy 3   Change in appetite 3   Feeling bad or failure about yourself  0   Trouble concentrating 3   Moving slowly or fidgety/restless 0   Suicidal thoughts 1   PHQ-9 Score 15   Difficult doing work/chores Extremely dIfficult      Psychosocial Evaluation and Intervention:     Psychosocial Evaluation - 10/28/16 1200      Psychosocial Evaluation & Interventions   Interventions Encouraged to exercise with the program and follow exercise prescription;Relaxation education;Stress management education   Comments Counselor met with Mr. Darene Lamer today for initial psychosocial evaluation.  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  Mr. Darene Lamer has a limited support system as he reports he is in the midst of separating from his spouse of 10 years.  H has some close friends within the community that are supportive and helpful in his life.  Mr. Darene Lamer states he sleeps okay and has a good appetite.  He denies a history of depression or anxiety or current symptoms; although his PHQ-9 scores were "15" at the time of admission.  counselor processed this with Mr. T reporting the Steroids he was on impacted his sleep and appetite and since coming into this program he feels more motivated and more energy.  Counselor encouraged him to consistently exercise since he has already experienced such positive benefits in a short time.  Mr. Darene Lamer also states his mood is positive currently because he is  making some healthier choices for his life with the separation and with his health and working out.  Mr. Darene Lamer has goals to breathe better and possibly get off the oxygen at least during the day.  Staff will continue to follow with Mr. T throughout the course of this program.  And Counselor will follow on his depressive symptoms to see if consistency in exercise Continues to improve this as it already has.        Psychosocial Re-Evaluation:  Psychosocial Re-Evaluation    Row Name 11/18/16 1532             Psychosocial Re-Evaluation   Comments Mr Volkman has been stressed. The cold weather is hard on his COPD, and he has recently moved due to his divorce. He is handling the stress and does enjoy coming to [place] for the exercise and socialization.         Education: Education Goals: Education classes will be provided on a weekly basis, covering required topics. Participant will state understanding/return demonstration of topics presented.  Learning Barriers/Preferences:     Learning Barriers/Preferences - 10/13/16 1146      Learning Barriers/Preferences   Learning Barriers None   Learning Preferences None      Education Topics: Initial Evaluation Education: - Verbal, written and demonstration of respiratory meds, RPE/PD scales, oximetry and breathing techniques. Instruction on use of nebulizers and MDIs: cleaning and proper use, rinsing mouth with steroid doses and importance of monitoring MDI activations. Flowsheet Row Pulmonary Rehab from 11/25/2016 in [facility] Cardiac and Pulmonary Rehab  Date  10/13/16  Educator  LB  Instruction Review Code  2- meets goals/outcomes      General Nutrition Guidelines/Fats and Fiber: -Group instruction provided by verbal, written material, models and posters to present the general guidelines for heart healthy nutrition. Gives an explanation and review of dietary fats and fiber.   Controlling Sodium/Reading Food Labels: -Group verbal and  written material supporting the discussion of sodium use in heart healthy nutrition. Review and explanation with models, verbal and written materials for utilization of the food label.   Exercise Physiology & Risk Factors: - Group verbal and written instruction with models to review the exercise physiology of the cardiovascular system and associated critical values. Details cardiovascular disease risk factors and the goals associated with each risk factor. Flowsheet Row Pulmonary Rehab from 11/25/2016 in [facility] Cardiac and Pulmonary Rehab  Date  10/21/16  Educator  [facility]  Instruction Review Code  2- meets goals/outcomes      Aerobic Exercise & Resistance Training: - Gives group verbal and written discussion on the health impact of inactivity. On the components of aerobic and resistive training programs and the benefits of this training and how to safely progress through these programs. Flowsheet Row Pulmonary Rehab from 11/25/2016 in [facility] Cardiac and Pulmonary Rehab  Date  11/18/16  Educator  [facility]  Instruction Review Code  2- meets goals/outcomes      Flexibility, Balance, General Exercise Guidelines: - Provides group verbal and written instruction on the benefits of flexibility and balance training programs. Provides general exercise guidelines with specific guidelines to those with heart or lung disease. Demonstration and skill practice provided.   Stress Management: - Provides group verbal and written instruction about the health risks of elevated stress, cause of high stress, and healthy ways to reduce stress.   Depression: - Provides group verbal and written instruction on the correlation between heart/lung disease and depressed mood, treatment options, and the stigmas associated with seeking treatment.   Exercise & Equipment Safety: - Individual verbal instruction and demonstration of equipment use and safety with use of the equipment.   Infection Prevention: - Provides verbal and  written material to individual with discussion of infection control including proper hand washing and proper equipment cleaning during exercise session. Flowsheet Row Pulmonary Rehab from 11/25/2016 in [facility] Cardiac and Pulmonary Rehab  Date  10/13/16  Educator  LB  Instruction Review Code  2- meets goals/outcomes  Falls Prevention: - Provides verbal and written material to individual with discussion of falls prevention and safety. Flowsheet Row Pulmonary Rehab from 11/25/2016 in [facility] Cardiac and Pulmonary Rehab  Date  10/13/16  Educator  LB  Instruction Review Code  2- meets goals/outcomes      Diabetes: - Individual verbal and written instruction to review signs/symptoms of diabetes, desired ranges of glucose level fasting, after meals and with exercise. Advice that pre and post exercise glucose checks will be done for 3 sessions at entry of program.   Chronic Lung Diseases: - Group verbal and written instruction to review new updates, new respiratory medications, new advancements in procedures and treatments. Provide informative websites and "800" numbers of self-education. Flowsheet Row Pulmonary Rehab from 11/25/2016 in [facility] Cardiac and Pulmonary Rehab  Date  11/11/16  Educator  LB  Instruction Review Code  2- meets goals/outcomes      Lung Procedures: - Group verbal and written instruction to describe testing methods done to diagnose lung disease. Review the outcome of test results. Describe the treatment choices: Pulmonary Function Tests, ABGs and oximetry.   Energy Conservation: - Provide group verbal and written instruction for methods to conserve energy, plan and organize activities. Instruct on pacing techniques, use of adaptive equipment and posture/positioning to relieve shortness of breath.   Triggers: - Group verbal and written instruction to review types of environmental controls: home humidity, furnaces, filters, dust mite/pet prevention, HEPA vacuums. To discuss  weather changes, air quality and the benefits of nasal washing.   Exacerbations: - Group verbal and written instruction to provide: warning signs, infection symptoms, calling MD promptly, preventive modes, and value of vaccinations. Review: effective airway clearance, coughing and/or vibration techniques. Create an Sports administrator.   Oxygen: - Individual and group verbal and written instruction on oxygen therapy. Includes supplement oxygen, available portable oxygen systems, continuous and intermittent flow rates, oxygen safety, concentrators, and Medicare reimbursement for oxygen. Flowsheet Row Pulmonary Rehab from 11/25/2016 in [facility] Cardiac and Pulmonary Rehab  Date  10/13/16  Educator  LB      Respiratory Medications: - Group verbal and written instruction to review medications for lung disease. Drug class, frequency, complications, importance of spacers, rinsing mouth after steroid MDI's, and proper cleaning methods for nebulizers. Flowsheet Row Pulmonary Rehab from 11/25/2016 in [facility] Cardiac and Pulmonary Rehab  Date  10/13/16  Educator  LB  Instruction Review Code  2- meets goals/outcomes      AED/CPR: - Group verbal and written instruction with the use of models to demonstrate the basic use of the AED with the basic ABC's of resuscitation.   Breathing Retraining: - Provides individuals verbal and written instruction on purpose, frequency, and proper technique of diaphragmatic breathing and pursed-lipped breathing. Applies individual practice skills. Flowsheet Row Pulmonary Rehab from 11/25/2016 in [facility] Cardiac and Pulmonary Rehab  Date  10/13/16  Educator  LB  Instruction Review Code  2- meets goals/outcomes      Anatomy and Physiology of the Lungs: - Group verbal and written instruction with the use of models to provide basic lung anatomy and physiology related to function, structure and complications of lung disease.   Heart Failure: - Group verbal and written instruction on  the basics of heart failure: signs/symptoms, treatments, explanation of ejection fraction, enlarged heart and cardiomyopathy.   Sleep Apnea: - Individual verbal and written instruction to review Obstructive Sleep Apnea. Review of risk factors, methods for diagnosing and types of masks and machines for OSA.   Anxiety: -  Provides group, verbal and written instruction on the correlation between heart/lung disease and anxiety, treatment options, and management of anxiety. Flowsheet Row Pulmonary Rehab from 11/25/2016 in [facility] Cardiac and Pulmonary Rehab  Date  10/28/16  Educator  [facility]  Instruction Review Code  2- Meets goals/outcomes      Relaxation: - Provides group, verbal and written instruction about the benefits of relaxation for patients with heart/lung disease. Also provides patients with examples of relaxation techniques. Flowsheet Row Pulmonary Rehab from 11/25/2016 in [facility] Cardiac and Pulmonary Rehab  Date  11/25/16  Educator  [facility]  Instruction Review Code  2- Meets goals/outcomes      Knowledge Questionnaire Score:     Knowledge Questionnaire Score - 10/13/16 1146      Knowledge Questionnaire Score   Pre Score 7/10       Core Components/Risk Factors/Patient Goals at Admission:     Personal Goals and Risk Factors at Admission - 10/13/16 1150      Core Components/Risk Factors/Patient Goals on Admission    Weight Management Yes;Weight Loss   Intervention Weight Management: Develop a combined nutrition and exercise program designed to reach desired caloric intake, while maintaining appropriate intake of nutrient and fiber, sodium and fats, and appropriate energy expenditure required for the weight goal.;Weight Management: Provide education and appropriate resources to help participant work on and attain dietary goals.   Admit Weight 177 lb (80.3 kg)   Goal Weight: Short Term 172 lb (78 kg)   Goal Weight: Long Term 167 lb (75.8 kg)   Expected Outcomes Short Term: Continue to  assess and modify interventions until short term weight is achieved;Long Term: Adherence to nutrition and physical activity/exercise program aimed toward attainment of established weight goal;Weight Loss: Understanding of general recommendations for a balanced deficit meal plan, which promotes 1-2 lb weight loss per week and includes a negative energy balance of [phone] kcal/d;Understanding recommendations for meals to include 15-35% energy as protein, 25-35% energy from fat, 35-60% energy from carbohydrates, less than 225m of dietary cholesterol, 20-35 gm of total fiber daily;Understanding of distribution of calorie intake throughout the day with the consumption of 4-5 meals/snacks   Sedentary Yes   Intervention Provide advice, education, support and counseling about physical activity/exercise needs.;Develop an individualized exercise prescription for aerobic and resistive training based on initial evaluation findings, risk stratification, comorbidities and participant's personal goals.   Expected Outcomes Achievement of increased cardiorespiratory fitness and enhanced flexibility, muscular endurance and strength shown through measurements of functional capacity and personal statement of participant.   Increase Strength and Stamina Yes   Intervention Provide advice, education, support and counseling about physical activity/exercise needs.;Develop an individualized exercise prescription for aerobic and resistive training based on initial evaluation findings, risk stratification, comorbidities and participant's personal goals.   Expected Outcomes Achievement of increased cardiorespiratory fitness and enhanced flexibility, muscular endurance and strength shown through measurements of functional capacity and personal statement of participant.   Improve shortness of breath with ADL's Yes   Intervention Provide education, individualized exercise plan and daily activity instruction to help decrease symptoms of  SOB with activities of daily living.   Expected Outcomes Short Term: Achieves a reduction of symptoms when performing activities of daily living.   Develop more efficient breathing techniques such as purse lipped breathing and diaphragmatic breathing; and practicing self-pacing with activity Yes   Intervention Provide education, demonstration and support about specific breathing techniuqes utilized for more efficient breathing. Include techniques such as pursed lipped breathing, diaphragmatic breathing and self-pacing  activity.   Expected Outcomes Short Term: Participant will be able to demonstrate and use breathing techniques as needed throughout daily activities.   Increase knowledge of respiratory medications and ability to use respiratory devices properly  Yes  ProAir MDI; oxygen 2-4l/m   Intervention Provide education and demonstration as needed of appropriate use of medications, inhalers, and oxygen therapy.   Expected Outcomes Short Term: Achieves understanding of medications use. Understands that oxygen is a medication prescribed by physician. Demonstrates appropriate use of inhaler and oxygen therapy.   Hypertension Yes   Intervention Provide education on lifestyle modifcations including regular physical activity/exercise, weight management, moderate sodium restriction and increased consumption of fresh fruit, vegetables, and low fat dairy, alcohol moderation, and smoking cessation.;Monitor prescription use compliance.   Expected Outcomes Short Term: Continued assessment and intervention until BP is < 140/53m HG in hypertensive participants. < 130/824mHG in hypertensive participants with diabetes, heart failure or chronic kidney disease.;Long Term: Maintenance of blood pressure at goal levels.   Lipids Yes   Intervention Provide education and support for participant on nutrition & aerobic/resistive exercise along with prescribed medications to achieve LDL <7059mHDL >32m61m Expected  Outcomes Short Term: Participant states understanding of desired cholesterol values and is compliant with medications prescribed. Participant is following exercise prescription and nutrition guidelines.;Long Term: Cholesterol controlled with medications as prescribed, with individualized exercise RX and with personalized nutrition plan. Value goals: LDL < 70mg49mL > 40 mg.      Core Components/Risk Factors/Patient Goals Review:      Goals and Risk Factor Review    Row Name 10/21/16 1357 10/26/16 1113 11/18/16 1516         Core Components/Risk Factors/Patient Goals Review   Personal Goals Review Develop more efficient breathing techniques such as purse lipped breathing and diaphragmatic breathing and practicing self-pacing with activity. Develop more efficient breathing techniques such as purse lipped breathing and diaphragmatic breathing and practicing self-pacing with activity.;Increase Strength and Stamina Sedentary;Increase Strength and Stamina;Improve shortness of breath with ADL's;Develop more efficient breathing techniques such as purse lipped breathing and diaphragmatic breathing and practicing self-pacing with activity.;Increase knowledge of respiratory medications and ability to use respiratory devices properly.;Hypertension;Lipids     Review Reviewed pursed lip breathing technique with Rusty today. Patient tolerated his first session well and stated that it didn't bother him the next day. He has been using PLB and stated that it is helping him a little.  Mr TilliPickarprogressed with his exercise goals. He has maintained acceptable O2Sat's with his exercise on 2l/m. Mr Neuzil does self-adjust his oxygen liter flow and is very content with the gas E tank for portability. He knows the advantage to continuous flow.  Using PLB and pacing , he is active at his new house and has made a carrier for his O2 tank for his tractor. Today I gave him a spacer for his ProAir with instructions.  His BP is acceptable, and he is complaint with his lipid medication.      Expected Outcomes Rusty will become more profieicent at using PLB and will eventually become independent in using PLB without queuing. RustyStormy Card attend class regularly and continue to gain increases in strength and stamina. He will continue to use PLB to help control SOB.  Continue progressing with his exercise and knowledge of self-management of his COPD.        Core Components/Risk Factors/Patient Goals at Discharge (Final Review):      Goals and Risk Factor Review -  11/18/16 1516      Core Components/Risk Factors/Patient Goals Review   Personal Goals Review Sedentary;Increase Strength and Stamina;Improve shortness of breath with ADL's;Develop more efficient breathing techniques such as purse lipped breathing and diaphragmatic breathing and practicing self-pacing with activity.;Increase knowledge of respiratory medications and ability to use respiratory devices properly.;Hypertension;Lipids   Review Mr Cipollone has progressed with his exercise goals. He has maintained acceptable O2Sat's with his exercise on 2l/m. Mr Maloof does self-adjust his oxygen liter flow and is very content with the gas E tank for portability. He knows the advantage to continuous flow.  Using PLB and pacing , he is active at his new house and has made a carrier for his O2 tank for his tractor. Today I gave him a spacer for his ProAir with instructions. His BP is acceptable, and he is complaint with his lipid medication.    Expected Outcomes Continue progressing with his exercise and knowledge of self-management of his COPD.      ITP Comments:   Comments:  30 Day Note Review

## 2016-11-30 NOTE — Progress Notes (Signed)
Daily Session Note  Patient Details  Name: Austin Patterson MRN: [id number] Date of Birth: [birth year] Referring Provider:   Flowsheet Row Pulmonary Rehab from 10/13/2016 in [facility] Cardiac and Pulmonary Rehab  Referring Provider  Laverle Hobby MD      Encounter Date: 11/30/2016  Check In:     Session Check In - 11/30/16 1120      Check-In   Location [facility]-Cardiac & Pulmonary Rehab   Staff Present Nyoka Cowden, RN, BSN, Walden Field, BS, RRT, Respiratory Dareen Piano, BA, ACSM CEP, Exercise Physiologist   Supervising physician immediately available to respond to emergencies [facility] immediately available ER MD   Physician(s) Jacqualine Code and Mariea Clonts   Medication changes reported     No   Fall or balance concerns reported    No   Warm-up and Cool-down Performed as group-led instruction   Resistance Training Performed Yes   VAD Patient? No     Pain Assessment   Currently in Pain? No/denies   Multiple Pain Sites No         Goals Met:  Proper associated with RPD/PD & O2 Sat Independence with exercise equipment Exercise tolerated well Strength training completed today  Goals Unmet:  Not Applicable  Comments: Pt able to follow exercise prescription today without complaint.  Will continue to monitor for progression.    Dr. Emily Filbert is Medical Director for [place] and [facility].

## 2016-12-02 DIAGNOSIS — J449 Chronic obstructive pulmonary disease, unspecified: Secondary | ICD-10-CM

## 2016-12-02 NOTE — Progress Notes (Signed)
Daily Session Note  Patient Details  Name: Austin Patterson MRN: [id number] Date of Birth: 31-Oct-1955 Referring Provider:   Flowsheet Row Pulmonary Rehab from 10/13/2016 in [facility] Cardiac and Pulmonary Rehab  Referring Provider  Laverle Hobby MD      Encounter Date: 12/02/2016  Check In:     Session Check In - 12/02/16 1154      Check-In   Location [facility]-Cardiac & Pulmonary Rehab   Staff Present Carson Myrtle, BS, RRT, Respiratory Lennie Hummer, MA, ACSM RCEP, Exercise Physiologist;Amanda Oletta Darter, BA, ACSM CEP, Exercise Physiologist   Supervising physician immediately available to respond to emergencies [facility] immediately available ER MD   Physician(s) Cinda Quest and [place]   Medication changes reported     No   Fall or balance concerns reported    No   Warm-up and Cool-down Performed as group-led instruction   Resistance Training Performed Yes   VAD Patient? No         Goals Met:  Proper associated with RPD/PD & O2 Sat Independence with exercise equipment Exercise tolerated well Strength training completed today  Goals Unmet:  Not Applicable  Comments: Pt able to follow exercise prescription today without complaint.  Will continue to monitor for progression.    Dr. Emily Filbert is Medical Director for [place] and [facility].

## 2016-12-07 ENCOUNTER — Encounter: Payer: Medicaid Other | Admitting: *Deleted

## 2016-12-07 DIAGNOSIS — J449 Chronic obstructive pulmonary disease, unspecified: Secondary | ICD-10-CM

## 2016-12-07 NOTE — Progress Notes (Signed)
Daily Session Note  Patient Details  Name: Austin Patterson MRN: [id number] Date of Birth: [birth year] Referring Provider:   Flowsheet Row Pulmonary Rehab from 10/13/2016 in [facility] Cardiac and Pulmonary Rehab  Referring Provider  Laverle Hobby MD      Encounter Date: 12/07/2016  Check In:     Session Check In - 12/07/16 1017      Check-In   Location [facility]-Cardiac & Pulmonary Rehab   Staff Present Carson Myrtle, BS, RRT, Respiratory Dareen Piano, BA, ACSM CEP, Exercise Physiologist;Aldo Sondgeroth Amedeo Plenty, BS, ACSM CEP, Exercise Physiologist   Supervising physician immediately available to respond to emergencies [facility] immediately available ER MD   Physician(s) Alfred Levins and Corky Downs   Medication changes reported     No   Fall or balance concerns reported    No   Warm-up and Cool-down Performed on first and last piece of equipment   Resistance Training Performed Yes   VAD Patient? No     Pain Assessment   Currently in Pain? No/denies   Multiple Pain Sites No         Goals Met:  Proper associated with RPD/PD & O2 Sat Independence with exercise equipment Exercise tolerated well Personal goals reviewed Strength training completed today  Goals Unmet:  Not Applicable  Comments: Pt able to follow exercise prescription today without complaint.  Will continue to monitor for progression.    Dr. Emily Filbert is Medical Director for [place] and [facility].

## 2016-12-09 DIAGNOSIS — J449 Chronic obstructive pulmonary disease, unspecified: Secondary | ICD-10-CM | POA: Diagnosis not present

## 2016-12-09 NOTE — Progress Notes (Signed)
Daily Session Note  Patient Details  Name: Austin Patterson MRN: [id number] Date of Birth: 1956/10/03 Referring Provider:   Flowsheet Row Pulmonary Rehab from 10/13/2016 in [facility] Cardiac and Pulmonary Rehab  Referring Provider  Laverle Hobby MD      Encounter Date: 12/09/2016  Check In:     Session Check In - 12/09/16 1243      Check-In   Location [facility]-Cardiac & Pulmonary Rehab   Staff Present Alberteen Sam, MA, ACSM RCEP, Exercise Physiologist;Atharva Mirsky Oletta Darter, BA, ACSM CEP, Exercise Physiologist;Laureen Janell Quiet, RRT, Respiratory Therapist   Supervising physician immediately available to respond to emergencies [facility] immediately available ER MD   Physician(s) Quentin Cornwall and Jimmye Norman   Medication changes reported     No   Fall or balance concerns reported    No   Warm-up and Cool-down Performed as group-led Location manager Performed Yes   VAD Patient? No     Pain Assessment   Currently in Pain? No/denies   Multiple Pain Sites No         Goals Met:  Proper associated with RPD/PD & O2 Sat Independence with exercise equipment Exercise tolerated well Strength training completed today  Goals Unmet:  Not Applicable  Comments: Pt able to follow exercise prescription today without complaint.  Will continue to monitor for progression.    Dr. Emily Filbert is Medical Director for [place] and [facility].

## 2016-12-11 ENCOUNTER — Encounter: Payer: Medicaid Other | Admitting: *Deleted

## 2016-12-11 DIAGNOSIS — J449 Chronic obstructive pulmonary disease, unspecified: Secondary | ICD-10-CM | POA: Diagnosis not present

## 2016-12-11 NOTE — Progress Notes (Signed)
Daily Session Note  Patient Details  Name: Austin Patterson MRN: [id number] Date of Birth: 1956/07/06 Referring Provider:   Flowsheet Row Pulmonary Rehab from 10/13/2016 in [facility] Cardiac and Pulmonary Rehab  Referring Provider  Laverle Hobby MD      Encounter Date: 12/11/2016  Check In:     Session Check In - 12/11/16 1045      Check-In   Location [facility]-Cardiac & Pulmonary Rehab   Staff Present Gerlene Burdock, RN, Vickki Hearing, BA, ACSM CEP, Exercise Physiologist   Supervising physician immediately available to respond to emergencies [facility] immediately available ER MD   Physician(s) Dr. Joni Fears and Dr. Marcelene Butte   Medication changes reported     No   Fall or balance concerns reported    No   Warm-up and Cool-down Performed as group-led instruction   Resistance Training Performed Yes   VAD Patient? No         Goals Met:  Proper associated with RPD/PD & O2 Sat Exercise tolerated well  Goals Unmet:  Not Applicable  Comments:     Dr. Emily Filbert is Medical Director for [place] and [facility].

## 2016-12-16 ENCOUNTER — Encounter: Payer: Medicaid Other | Admitting: Respiratory Therapy

## 2016-12-16 DIAGNOSIS — J449 Chronic obstructive pulmonary disease, unspecified: Secondary | ICD-10-CM

## 2016-12-16 NOTE — Progress Notes (Signed)
Daily Session Note  Patient Details  Name: Austin Patterson MRN: [id number] Date of Birth: October 03, 1956 Referring Provider:   Flowsheet Row Pulmonary Rehab from 10/13/2016 in [facility] Cardiac and Pulmonary Rehab  Referring Provider  Laverle Hobby MD      Encounter Date: 12/16/2016  Check In:     Session Check In - 12/16/16 1139      Check-In   Location [facility]-Cardiac & Pulmonary Rehab   Staff Present Carson Myrtle, BS, RRT, Respiratory Lennie Hummer, MA, ACSM RCEP, Exercise Physiologist;Other   Supervising physician immediately available to respond to emergencies [facility] immediately available ER MD   Physician(s) Jimmye Norman and [place]   Medication changes reported     No   Fall or balance concerns reported    No   Warm-up and Cool-down Performed as group-led instruction   Resistance Training Performed Yes   VAD Patient? No     Pain Assessment   Currently in Pain? No/denies   Multiple Pain Sites No         History  Smoking Status   Former Smoker   Quit date: 10/14/2015  Smokeless Tobacco   Former Systems developer    Goals Met:  Proper associated with RPD/PD & O2 Sat Independence with exercise equipment Using PLB without cueing & demonstrates good technique Exercise tolerated well Strength training completed today  Goals Unmet:  Not Applicable  Comments: Pt able to follow exercise prescription today without complaint.  Will continue to monitor for progression.    Dr. Emily Filbert is Medical Director for [place] and [facility].

## 2016-12-18 ENCOUNTER — Encounter: Payer: Medicaid Other | Attending: Internal Medicine

## 2016-12-18 DIAGNOSIS — J449 Chronic obstructive pulmonary disease, unspecified: Secondary | ICD-10-CM | POA: Diagnosis not present

## 2016-12-18 NOTE — Progress Notes (Signed)
Daily Session Note  Patient Details  Name: Austin Patterson MRN: [id number] Date of Birth: Jun 01, 1956 Referring Provider:   Flowsheet Row Pulmonary Rehab from 10/13/2016 in [facility] Cardiac and Pulmonary Rehab  Referring Provider  Laverle Hobby MD      Encounter Date: 12/18/2016  Check In:     Session Check In - 12/18/16 1034      Check-In   Location [facility]-Cardiac & Pulmonary Rehab   Staff Present Nada Maclachlan, BA, ACSM CEP, Exercise Physiologist;Shelvy Heckert RN BSN   Supervising physician immediately available to respond to emergencies [facility] immediately available ER MD   Physician(s) Drs. [name] and [name]   Medication changes reported     No   Fall or balance concerns reported    No   Tobacco Cessation No Change   Warm-up and Cool-down Performed as group-led Location manager Performed Yes   VAD Patient? No     Pain Assessment   Currently in Pain? No/denies   Multiple Pain Sites No           Exercise Prescription Changes - 12/17/16 1100      Response to Exercise   Blood Pressure (Admit) 132/78   Blood Pressure (Exercise) 132/70   Blood Pressure (Exit) 124/60   Heart Rate (Admit) 109 bpm   Heart Rate (Exercise) 106 bpm   Heart Rate (Exit) 90 bpm   Oxygen Saturation (Admit) 97 %   Oxygen Saturation (Exercise) 96 %   Oxygen Saturation (Exit) 97 %   Rating of Perceived Exertion (Exercise) 13   Perceived Dyspnea (Exercise) 1   Duration Continue with 45 min of aerobic exercise without signs/symptoms of physical distress.   Intensity THRR unchanged     Progression   Progression Continue to progress workloads to maintain intensity without signs/symptoms of physical distress.     Resistance Training   Training Prescription Yes   Weight 4   Reps 10-15     Interval Training   Interval Training No     Oxygen   Oxygen Continuous   Liters 2     Recumbant Elliptical   Level 3   Minutes 15   METs 1.9     T5 Nustep   Level 3   Minutes 15   METs 2      History  Smoking Status  . Former Smoker  . Quit date: 10/14/2015  Smokeless Tobacco  . Former Systems developer    Goals Met:  Proper associated with RPD/PD & O2 Sat Independence with exercise equipment Using PLB without cueing & demonstrates good technique Exercise tolerated well Strength training completed today  Goals Unmet:  Not Applicable  Comments: Pt able to follow exercise prescription today without complaint.  Post 6 minute walk test completed today by Exercise Physiologist.  Will continue to monitor for progression.    Dr. Emily Filbert is Medical Director for [place] and [facility].

## 2016-12-21 ENCOUNTER — Encounter: Payer: Medicaid Other | Admitting: *Deleted

## 2016-12-21 DIAGNOSIS — J449 Chronic obstructive pulmonary disease, unspecified: Secondary | ICD-10-CM

## 2016-12-21 NOTE — Progress Notes (Signed)
Daily Session Note  Patient Details  Name: Austin Patterson MRN: [id number] Date of Birth: 06-29-1956 Referring Provider:   Flowsheet Row Pulmonary Rehab from 10/13/2016 in [facility] Cardiac and Pulmonary Rehab  Referring Provider  Laverle Hobby MD      Encounter Date: 12/21/2016  Check In:     Session Check In - 12/21/16 1125      Check-In   Location [facility]-Cardiac & Pulmonary Rehab   Staff Present Nada Maclachlan, BA, ACSM CEP, Exercise Physiologist;Romey Mathieson Amedeo Plenty, BS, ACSM CEP, Exercise Physiologist;Laureen Janell Quiet, RRT, Respiratory Therapist   Supervising physician immediately available to respond to emergencies [facility] immediately available ER MD   Physician(s) Jimmye Norman and Corky Downs   Medication changes reported     No   Fall or balance concerns reported    No   Tobacco Cessation No Change   Warm-up and Cool-down Performed as group-led instruction   Resistance Training Performed Yes   VAD Patient? No     Pain Assessment   Currently in Pain? No/denies   Multiple Pain Sites No         History  Smoking Status  . Former Smoker  . Quit date: 10/14/2015  Smokeless Tobacco  . Former Systems developer    Goals Met:  Proper associated with RPD/PD & O2 Sat Independence with exercise equipment Exercise tolerated well No report of cardiac concerns or symptoms Strength training completed today  Goals Unmet:  Not Applicable  Comments: Pt able to follow exercise prescription today without complaint.  Will continue to monitor for progression.    Dr. Emily Filbert is Medical Director for [place] and [facility].

## 2016-12-23 DIAGNOSIS — J449 Chronic obstructive pulmonary disease, unspecified: Secondary | ICD-10-CM

## 2016-12-23 NOTE — Progress Notes (Signed)
Daily Session Note  Patient Details  Name: JANIE STROTHMAN MRN: [id number] Date of Birth: Feb 20, 1956 Referring Provider:   Flowsheet Row Pulmonary Rehab from 10/13/2016 in [facility] Cardiac and Pulmonary Rehab  Referring Provider  Laverle Hobby MD      Encounter Date: 12/23/2016  Check In:     Session Check In - 12/23/16 1245      Check-In   Location [facility]-Cardiac & Pulmonary Rehab   Staff Present Alberteen Sam, MA, ACSM RCEP, Exercise Physiologist;Ziyanna Tolin Oletta Darter, BA, ACSM CEP, Exercise Physiologist;Laureen Owens Shark, BS, RRT, Respiratory Therapist   Supervising physician immediately available to respond to emergencies [facility] immediately available ER MD   Physician(s) Corky Downs and Archie Balboa   Medication changes reported     No   Fall or balance concerns reported    No   Warm-up and Cool-down Performed as group-led Location manager Performed Yes   VAD Patient? No     Pain Assessment   Currently in Pain? No/denies   Multiple Pain Sites No         History  Smoking Status  . Former Smoker  . Quit date: 10/14/2015  Smokeless Tobacco  . Former Systems developer    Goals Met:  Proper associated with RPD/PD & O2 Sat Independence with exercise equipment Exercise tolerated well Strength training completed today  Goals Unmet:  Not Applicable  Comments: Pt able to follow exercise prescription today without complaint.  Will continue to monitor for progression.    Dr. Emily Filbert is Medical Director for [place] and [facility].

## 2016-12-25 ENCOUNTER — Encounter: Payer: Medicaid Other | Admitting: *Deleted

## 2016-12-25 DIAGNOSIS — J449 Chronic obstructive pulmonary disease, unspecified: Secondary | ICD-10-CM | POA: Diagnosis not present

## 2016-12-25 NOTE — Progress Notes (Signed)
Daily Session Note  Patient Details  Name: Austin Patterson MRN: [id number] Date of Birth: May 25, 1956 Referring Provider:   Flowsheet Row Pulmonary Rehab from 10/13/2016 in [facility] Cardiac and Pulmonary Rehab  Referring Provider  Laverle Hobby MD      Encounter Date: 12/25/2016  Check In:     Session Check In - 12/25/16 1115      Check-In   Location [facility]-Cardiac & Pulmonary Rehab   Staff Present Nada Maclachlan, BA, ACSM CEP, Exercise Physiologist;Other  Darel Hong, RN BSN   Supervising physician immediately available to respond to emergencies [facility] immediately available ER MD   Physician(s) Drs. Quentin Cornwall & [place]   Medication changes reported     No   Fall or balance concerns reported    No   Tobacco Cessation No Change   Warm-up and Cool-down Performed as group-led Location manager Performed Yes   VAD Patient? No     Pain Assessment   Currently in Pain? No/denies   Multiple Pain Sites No         History  Smoking Status  . Former Smoker  . Quit date: 10/14/2015  Smokeless Tobacco  . Former Systems developer    Goals Met:  Proper associated with RPD/PD & O2 Sat Independence with exercise equipment Using PLB without cueing & demonstrates good technique Exercise tolerated well Strength training completed today  Goals Unmet:  Not Applicable  Comments: Pt able to follow exercise prescription today without complaint.  Will continue to monitor for progression.   Dr. Emily Filbert is Medical Director for [place] and [facility].

## 2016-12-28 ENCOUNTER — Encounter: Payer: Self-pay | Admitting: Respiratory Therapy

## 2016-12-28 DIAGNOSIS — J449 Chronic obstructive pulmonary disease, unspecified: Secondary | ICD-10-CM

## 2016-12-28 NOTE — Progress Notes (Signed)
Pulmonary Individual Treatment Plan  Patient Details  Name: Austin Patterson MRN: [id number] Date of Birth: 05-22-1956 Referring Provider:   Flowsheet Row Pulmonary Rehab from 10/13/2016 in [facility] Cardiac and Pulmonary Rehab  Referring Provider  Laverle Hobby MD      Initial Encounter Date:  Flowsheet Row Pulmonary Rehab from 10/13/2016 in [facility] Cardiac and Pulmonary Rehab  Date  10/13/16  Referring Provider  Laverle Hobby MD      Visit Diagnosis: COPD, moderate ([place])  Patient's Home Medications on Admission:  Current Outpatient Prescriptions:    albuterol (PROVENTIL HFA;VENTOLIN HFA) 108 (90 Base) MCG/ACT inhaler, Inhale 2 puffs into the lungs every 4 (four) hours as needed for wheezing or shortness of breath., Disp: 3 Inhaler, Rfl: 3   amLODipine (NORVASC) 10 MG tablet, Take 1 tablet (10 mg total) by mouth daily., Disp: 90 tablet, Rfl: 3   aspirin EC 81 MG tablet, Take 1 tablet (81 mg total) by mouth daily., Disp: 90 tablet, Rfl: 3   atorvastatin (LIPITOR) 40 MG tablet, Take 1 tablet (40 mg total) by mouth at bedtime., Disp: 90 tablet, Rfl: 3   calcium-vitamin D (OSCAL WITH D) 500-200 MG-UNIT tablet, Take 2 tablets by mouth daily with breakfast., Disp: 60 tablet, Rfl: 0   cetirizine (ZYRTEC) 10 MG tablet, Take 10 mg by mouth at bedtime., Disp: , Rfl:    clopidogrel (PLAVIX) 75 MG tablet, Take 1 tablet (75 mg total) by mouth daily., Disp: 90 tablet, Rfl: 3   losartan-hydrochlorothiazide (HYZAAR) 100-12.5 MG tablet, Take 1 tablet by mouth daily., Disp: 90 tablet, Rfl: 3   metoprolol succinate (TOPROL-XL) 50 MG 24 hr tablet, Take 1 tablet (50 mg total) by mouth daily., Disp: 90 tablet, Rfl: 3   Multiple Vitamin (MULTIVITAMIN WITH MINERALS) TABS tablet, Take 1 tablet by mouth daily., Disp: 30 tablet, Rfl: 0   pantoprazole (PROTONIX) 40 MG tablet, Take 1 tablet (40 mg total) by mouth 2 (two) times daily., Disp: 90 tablet, Rfl: 3   predniSONE (DELTASONE) 5 MG  tablet, Take 1.5 tablet daily (Patient taking differently: Take 7.5 mg by mouth daily. 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 Latest Ref Rng & Units 10/14/2015 12/09/2015   Hemoglobin A1c 4.0 - 6.0 % - 4.9   HCO3 21.0 - 28.0 mEq/L 29.9(H) -       ADL [facility]SD:     Pulmonary Assessment Scores    Row Name 10/13/16 1147         ADL UCSD   SOB Score total 38     Rest 0     Walk 3     Stairs 4     Bath 2     Dress 0     Shop 2        Pulmonary Function Assessment:     Pulmonary Function Assessment - 10/13/16 1146      Pulmonary Function Tests  RV% 68 %   DLCO% 26 %     Initial Spirometry Results   FVC% 61 %   FEV1% 52 %   FEV1/FVC Ratio 65   Comments Test date 01/29/16     Post Bronchodilator Spirometry Results   FVC% 61 %   FEV1% 52 %   FEV1/FVC Ratio 65     Breath   Shortness of Breath Yes;Limiting activity      Exercise Target Goals:    Exercise Program Goal: Individual exercise prescription set with THRR, safety & activity barriers. Participant demonstrates ability to understand and report RPE using BORG scale, to self-measure pulse accurately, and to acknowledge the importance of the exercise prescription.  Exercise Prescription Goal: Starting with aerobic activity 30 plus minutes a day, 3 days per week for initial exercise prescription. Provide home exercise prescription and guidelines  that participant acknowledges understanding prior to discharge.  Activity Barriers & Risk Stratification:     Activity Barriers & Cardiac Risk Stratification - 10/13/16 1145      Activity Barriers & Cardiac Risk Stratification   Activity Barriers Shortness of Breath;Deconditioning   Cardiac Risk Stratification Moderate      6 Minute Walk:     6 Minute Walk    Row Name 10/13/16 1155 12/18/16 1157       6 Minute Walk   Phase Initial  --    Distance 1486 feet 1020 feet    Distance % Change  -- 3 %    Walk Time 6 minutes 6 minutes    # of Rest Breaks 0 0    MPH 2.81 1.93    METS 3.95 3.21    RPE 13 15    Perceived Dyspnea  3 2    VO2 Peak 13.81 11.25    Symptoms No No    Resting HR 100 bpm 108 bpm    Resting BP 124/72 140/72    Max Ex. HR 117 bpm 119 bpm    Max Ex. BP 136/74 152/72    2 Minute Post BP 126/70  --      Interval HR   Baseline HR 100 108    1 Minute HR 109 110    2 Minute HR 112  --    3 Minute HR 113 118    4 Minute HR 113 119    5 Minute HR 117  --    6 Minute HR 116 116    2 Minute Post HR 99  --    Interval Heart Rate? Yes  --      Interval Oxygen   Interval Oxygen? Yes Yes    Baseline Oxygen Saturation % 97 % 97 %    Baseline Liters of Oxygen 2 L 3 L    1 Minute Oxygen Saturation % 95 % 95 %    1 Minute Liters of Oxygen 2 L 3 L    2 Minute Oxygen Saturation % 98 %  --    2 Minute Liters of Oxygen 2 L 3 L    3 Minute Oxygen Saturation % 98 % 94 %    3 Minute Liters of Oxygen 2 L 3 L    4 Minute Oxygen Saturation % 95 % 94 %    4 Minute Liters of Oxygen 2 L 3 L    5 Minute Oxygen Saturation % 96 %  --    5 Minute Liters of Oxygen 2 L 3 L    6 Minute Oxygen Saturation %  96 % 95 %    6 Minute Liters of Oxygen 2 L 3 L    2 Minute Post Oxygen Saturation % 96 %  --    2 Minute Post Liters of Oxygen 2 L 3 L      Oxygen Initial Assessment:     Oxygen Initial Assessment - 12/28/16 0902      Home Oxygen   Home Oxygen Device Portable  Concentrator;E-Tanks   Sleep Oxygen Prescription Continuous   Liters per minute --  2-4   Home Exercise Oxygen Prescription Continuous   Liters per minute --  2-4   Home at Rest Exercise Oxygen Prescription Continuous   Liters per minute --  2-4   Compliance with Home Oxygen Use Yes     Initial 6 min Walk   Oxygen Used Continuous;E-Tanks   Liters per minute 2   Resting Oxygen Saturation  during 6 min walk 97 %   Exercise Oxygen Saturation  during 6 min walk 95 %     Program Oxygen Prescription   Program Oxygen Prescription Continuous;E-Tanks   Liters per minute --  2-3     Intervention   Short Term Goals To learn and exhibit compliance with exercise, home and travel O2 prescription;To Learn and understand importance of maintaining oxygen saturations>88%;To learn and demonstrate proper use of respiratory medications;To learn and understand importance of monitoring SPO2 with pulse oximeter and demonstrate accurate use of the pulse oximeter.;To learn and demonstrate proper purse lipped breathing techniques or other breathing techniques.   Long  Term Goals Exhibits compliance with exercise, home and travel O2 prescription;Maintenance of O2 saturations>88%;Compliance with respiratory medication;Verbalizes importance of monitoring SPO2 with pulse oximeter and return demonstration;Exhibits proper breathing techniques, such as purse lipped breathing or other method taught during program session;Demonstrates proper use of MDIs      Oxygen Re-Evaluation:   Oxygen Discharge (Final Oxygen Re-Evaluation):   Initial Exercise Prescription:     Initial Exercise Prescription - 11/06/16 0900      Oxygen   Oxygen Continuous   Liters 2     Treadmill   MPH 1.5   Grade 0.5   Minutes 15   METs 2.25     T5 Nustep   Level 3   Minutes 15   METs 2     Prescription Details   Frequency (times per week) 3   Duration Progress to 45 minutes of aerobic exercise without signs/symptoms of  physical distress     Intensity   THRR 40-80% of Max Heartrate 124-148   Ratings of Perceived Exertion 11-15   Perceived Dyspnea 0-4     Progression   Progression Continue to progress workloads to maintain intensity without signs/symptoms of physical distress.     Resistance Training   Training Prescription Yes   Weight 3   Reps 10-15      Perform Capillary Blood Glucose checks as needed.  Exercise Prescription Changes:     Exercise Prescription Changes    Row Name 10/13/16 1100 10/22/16 1200 11/18/16 1200 11/19/16 1200 12/03/16 1200     Response to Exercise   Blood Pressure (Admit) 124/72 126/80  -- 110/64 138/76   Blood Pressure (Exercise) 136/74 156/84  -- 138/64 136/74   Blood Pressure (Exit) 126/70 126/70  -- 150/82 134/70   Heart Rate (Admit) 100 bpm 109 bpm  -- 101 bpm 108 bpm   Heart Rate (Exercise) 117 bpm 123 bpm  -- 117 bpm 119 bpm   Heart Rate (Exit) 99  bpm 110 bpm  -- 91 bpm 88 bpm   Oxygen Saturation (Admit) 97 % 95 %  -- 96 % 97 %   Oxygen Saturation (Exercise) 95 % 95 %  -- 93 % 95 %   Oxygen Saturation (Exit) 96 % 92 %  -- 95 % 95 %   Rating of Perceived Exertion (Exercise) 13 13  -- 13 13   Perceived Dyspnea (Exercise) 3 3  -- 1 1   Symptoms none none  -- none  --   Comments  --  -- Home exercise guidelines given 11/18/2016  --  --   Duration  --  --  --  -- Progress to 45 minutes of aerobic exercise without signs/symptoms of physical distress   Intensity  --  --  --  -- THRR unchanged     Progression   Progression  -- Continue to progress workloads to maintain intensity without signs/symptoms of physical distress.  -- Continue to progress workloads to maintain intensity without signs/symptoms of physical distress. Continue to progress workloads to maintain intensity without signs/symptoms of physical distress.     Resistance Training   Training Prescription  -- Yes  -- Yes Yes   Weight  -- 3  -- 3 4   Reps  -- 10-15  --  -- 10-15     Interval  Training   Interval Training  -- No  -- No No     Oxygen   Oxygen  -- Continuous  -- Continuous Continuous   Liters  -- 2  -- 2 2     Treadmill   MPH  --  --  -- 1.5 1.5   Grade  --  --  -- 0.5 0.5   Minutes  --  --  -- 15 15   METs  --  --  -- 2.25 2.25     Recumbant Elliptical   Level  -- 2  -- 2 3   RPM  -- 48  -- 39  --   Minutes  -- 15  -- 15 15   METs  -- 1.9  -- 1.8 1.8     T5 Nustep   Level  -- 3  --  --  --   Minutes  -- 15  --  --  --   METs  -- 1.9  --  --  --     Home Exercise Plan   Plans to continue exercise at  --  -- Home  Walking  --  --   Frequency  --  -- Add 2 additional days to program exercise sessions.  --  --     Exercise Review   Progression --  walk test results  --  --  --  --   Row Name 12/17/16 1100             Response to Exercise   Blood Pressure (Admit) 132/78       Blood Pressure (Exercise) 132/70       Blood Pressure (Exit) 124/60       Heart Rate (Admit) 109 bpm       Heart Rate (Exercise) 106 bpm       Heart Rate (Exit) 90 bpm       Oxygen Saturation (Admit) 97 %       Oxygen Saturation (Exercise) 96 %       Oxygen Saturation (Exit) 97 %       Rating of Perceived Exertion (Exercise) 13  Perceived Dyspnea (Exercise) 1       Duration Continue with 45 min of aerobic exercise without signs/symptoms of physical distress.       Intensity THRR unchanged         Progression   Progression Continue to progress workloads to maintain intensity without signs/symptoms of physical distress.         Resistance Training   Training Prescription Yes       Weight 4       Reps 10-15         Interval Training   Interval Training No         Oxygen   Oxygen Continuous       Liters 2         Recumbant Elliptical   Level 3       Minutes 15       METs 1.9         T5 Nustep   Level 3       Minutes 15       METs 2          Exercise Comments:     Exercise Comments    Row Name 10/13/16 1200 10/21/16 1357 11/06/16 0926  11/18/16 1241 11/19/16 1258   Exercise Comments Austin Patterson wants to reduce the amount of oxygen he is wearing regularly. First full day of exercise!  Patient was oriented to gym and equipment including functions, settings, policies, and procedures.  Patient's individual exercise prescription and treatment plan were reviewed.  All starting workloads were established based on the results of the 6 minute walk test done at initial orientation visit.  The plan for exercise progression was also introduced and progression will be customized based on patient's performance and goals. Austin Patterson is progressing well with exercise. Reviewed home exercises with patient today. Patient plans to walk at home for exercise. Also discussed RPE, pulse, THR, restrictions, s/sx when to call MD, and weather considerations. He verbalized understanding.  Austin Patterson is toleratiing exercise well and staff will monitor his increase in workloads.   Austin Patterson Name 12/03/16 1258 12/17/16 1111         Exercise Comments Austin Patterson continues to progress well and has increased to 4 lb for strength training. Austin Patterson is tolerating exercise well and staff will add interval training to one machine.         Exercise Goals and Review:   Exercise Goals Re-Evaluation :   Discharge Exercise Prescription (Final Exercise Prescription Changes):     Exercise Prescription Changes - 12/17/16 1100      Response to Exercise   Blood Pressure (Admit) 132/78   Blood Pressure (Exercise) 132/70   Blood Pressure (Exit) 124/60   Heart Rate (Admit) 109 bpm   Heart Rate (Exercise) 106 bpm   Heart Rate (Exit) 90 bpm   Oxygen Saturation (Admit) 97 %   Oxygen Saturation (Exercise) 96 %   Oxygen Saturation (Exit) 97 %   Rating of Perceived Exertion (Exercise) 13   Perceived Dyspnea (Exercise) 1   Duration Continue with 45 min of aerobic exercise without signs/symptoms of physical distress.   Intensity THRR unchanged     Progression   Progression Continue to progress  workloads to maintain intensity without signs/symptoms of physical distress.     Resistance Training   Training Prescription Yes   Weight 4   Reps 10-15     Interval Training   Interval Training No     Oxygen   Oxygen Continuous  Liters 2     Recumbant Elliptical   Level 3   Minutes 15   METs 1.9     T5 Nustep   Level 3   Minutes 15   METs 2      Nutrition:  Target Goals: Understanding of nutrition guidelines, daily intake of sodium '1500mg'$ , cholesterol '200mg'$ , calories 30% from fat and 7% or less from saturated fats, daily to have 5 or more servings of fruits and vegetables.  Biometrics:     Pre Biometrics - 10/13/16 1200      Pre Biometrics   Height 5' 6.75" (1.695 m)   Weight 177 lb (80.3 kg)   Waist Circumference 38 inches   Hip Circumference 39.5 inches   Waist to Hip Ratio 0.96 %   BMI (Calculated) 28       Nutrition Therapy Plan and Nutrition Goals:   Nutrition Discharge: Rate Your Plate Scores:   Nutrition Goals Re-Evaluation:   Nutrition Goals Discharge (Final Nutrition Goals Re-Evaluation):   Psychosocial: Target Goals: Acknowledge presence or absence of significant depression and/or stress, maximize coping skills, provide positive support system. Participant is able to verbalize types and ability to use techniques and skills needed for reducing stress and depression.   Initial Review & Psychosocial Screening:     Initial Psych Review & Screening - 10/13/16 1153      Family Dynamics   Good Support System? Yes   Comments Austin Patterson does have good support from his friends. He is in the middle of a divorce from his wife of some twenty years. Austin Patterson states he misses going to a job and some activities he use to perform. He is excited about [facility] and improving his activity level.      Barriers   Psychosocial barriers to participate in program The patient should benefit from training in stress management and relaxation.      Screening Interventions   Interventions Encouraged to exercise;Program counselor consult      Quality of Life Scores:     Quality of Life - 10/13/16 1157      Quality of Life Scores   Health/Function Pre 20.69 %   Socioeconomic Pre 19.14 %   Psych/Spiritual Pre 20.86 %   Family Pre 19.6 %   GLOBAL Pre 20.26 %      PHQ-9: Recent Review Flowsheet Data    Depression screen [facility] 2/9 10/13/2016   Decreased Interest 3   Down, Depressed, Hopeless 2   PHQ - 2 Score 5   Altered sleeping 0   Tired, decreased energy 3   Change in appetite 3   Feeling bad or failure about yourself  0   Trouble concentrating 3   Moving slowly or fidgety/restless 0   Suicidal thoughts 1   PHQ-9 Score 15   Difficult doing work/chores Extremely dIfficult     Interpretation of Total Score  Total Score Depression Severity:  1-4 = Minimal depression, 5-9 = Mild depression, 10-14 = Moderate depression, 15-19 = Moderately severe depression, 20-27 = Severe depression   Psychosocial Evaluation and Intervention:     Psychosocial Evaluation - 10/28/16 1200      Psychosocial Evaluation & Interventions   Interventions Encouraged to exercise with the program and follow exercise prescription;Relaxation education;Stress management education   Comments Counselor met with Austin. Darene Patterson today for initial psychosocial evaluation.  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  Austin. Darene Patterson has a limited support system as  he reports he is in the midst of separating from his spouse of 10 years.  H has some close friends within the community that are supportive and helpful in his life.  Austin. Darene Patterson states he sleeps okay and has a good appetite.  He denies a history of depression or anxiety or current symptoms; although his PHQ-9 scores were "15" at the time of admission.  counselor processed this with Austin. Patterson reporting the Steroids he was on impacted his sleep and appetite and since coming into this program he  feels more motivated and more energy.  Counselor encouraged him to consistently exercise since he has already experienced such positive benefits in a short time.  Austin. Darene Patterson also states his mood is positive currently because he is making some healthier choices for his life with the separation and with his health and working out.  Austin. Darene Patterson has goals to breathe better and possibly get off the oxygen at least during the day.  Staff will continue to follow with Austin. Patterson throughout the course of this program.  And Counselor will follow on his depressive symptoms to see if consistency in exercise Continues to improve this as it already has.        Psychosocial Re-Evaluation:     Psychosocial Re-Evaluation    Row Name 11/18/16 1532 12/16/16 1504 12/23/16 1157         Psychosocial Re-Evaluation   Comments Austin Patterson has been stressed. The cold weather is hard on his COPD, and he has recently moved due to his divorce. He is handling the stress and does enjoy coming to [place] for the exercise and socialization.  -- Counselor Follow up with Austin. Pulse Patterson) today reporting he has seen some progress since coming into this program in that he can walk further without stopping to rest and he is sleeping better. [place] also states the socialization components of this program help him feel less isolated.  He admits the weather has impacted his mood and energy levels negatively but he is looking forward to daylight savings time and longer days soon.  Austin Patterson is coping well with his recent separation from his spouse of 20 years by exercising and realizing his life is "less stressful now."  Counselor commended [place] for his progress and his commitment to improving his health.     Expected Outcomes  -- Austin Patterson is attending [facility] regularly and enjoys the socialization with the other participants. He is adjusting  to his new house and has enjoyed the yardwork - creating a holder for his oxygen on his tractor.   Austin Patterson  will continue to exercise and cope with  his current stress with a better cognitive approach.  He is encouraged to find ways to be less isolated when not coming to exercise class.          Psychosocial Discharge (Final Psychosocial Re-Evaluation):     Psychosocial Re-Evaluation - 12/23/16 1157      Psychosocial Re-Evaluation   Comments Counselor Follow up with Austin. Vanetten Nasser) today reporting he has seen some progress since coming into this program in that he can walk further without stopping to rest and he is sleeping better. [place] also states the socialization components of this program help him feel less isolated.  He admits the weather has impacted his mood and energy levels negatively but he is looking forward to daylight savings time and longer days soon.  Felis is coping well with his recent separation from his spouse of 57  years by exercising and realizing his life is "less stressful now."  Counselor commended Adarius for his progress and his commitment to improving his health.   Expected Outcomes Izaak will continue to exercise and cope with  his current stress with a better cognitive approach.  He is encouraged to find ways to be less isolated when not coming to exercise class.        Education: Education Goals: Education classes will be provided on a weekly basis, covering required topics. Participant will state understanding/return demonstration of topics presented.  Learning Barriers/Preferences:     Learning Barriers/Preferences - 10/13/16 1146      Learning Barriers/Preferences   Learning Barriers None   Learning Preferences None      Education Topics: Initial Evaluation Education: - Verbal, written and demonstration of respiratory meds, RPE/PD scales, oximetry and breathing techniques. Instruction on use of nebulizers and MDIs: cleaning and proper use, rinsing mouth with steroid doses and importance of monitoring MDI activations. Flowsheet Row Pulmonary Rehab from  12/23/2016 in [facility] Cardiac and Pulmonary Rehab  Date  10/13/16  Educator  LB  Instruction Review Code  2- meets goals/outcomes      General Nutrition Guidelines/Fats and Fiber: -Group instruction provided by verbal, written material, models and posters to present the general guidelines for heart healthy nutrition. Gives an explanation and review of dietary fats and fiber. Flowsheet Row Pulmonary Rehab from 12/23/2016 in [facility] Cardiac and Pulmonary Rehab  Date  12/21/16  Educator  CR  Instruction Review Code  2- meets goals/outcomes      Controlling Sodium/Reading Food Labels: -Group verbal and written material supporting the discussion of sodium use in heart healthy nutrition. Review and explanation with models, verbal and written materials for utilization of the food label.   Exercise Physiology & Risk Factors: - Group verbal and written instruction with models to review the exercise physiology of the cardiovascular system and associated critical values. Details cardiovascular disease risk factors and the goals associated with each risk factor. Flowsheet Row Pulmonary Rehab from 12/23/2016 in [facility] Cardiac and Pulmonary Rehab  Date  10/21/16  Educator  [facility]  Instruction Review Code  2- meets goals/outcomes      Aerobic Exercise & Resistance Training: - Gives group verbal and written discussion on the health impact of inactivity. On the components of aerobic and resistive training programs and the benefits of this training and how to safely progress through these programs. Flowsheet Row Pulmonary Rehab from 12/23/2016 in [facility] Cardiac and Pulmonary Rehab  Date  11/18/16  Educator  [facility]  Instruction Review Code  2- meets goals/outcomes      Flexibility, Balance, General Exercise Guidelines: - Provides group verbal and written instruction on the benefits of flexibility and balance training programs. Provides general exercise guidelines with specific guidelines to those with heart or lung  disease. Demonstration and skill practice provided. Flowsheet Row Pulmonary Rehab from 12/23/2016 in [facility] Cardiac and Pulmonary Rehab  Date  12/11/16  Educator  [place]  Instruction Review Code  2- meets goals/outcomes      Stress Management: - Provides group verbal and written instruction about the health risks of elevated stress, cause of high stress, and healthy ways to reduce stress.   Depression: - Provides group verbal and written instruction on the correlation between heart/lung disease and depressed mood, treatment options, and the stigmas associated with seeking treatment. Flowsheet Row Pulmonary Rehab from 12/23/2016 in [facility] Cardiac and Pulmonary Rehab  Date  12/23/16  Educator  Lupita Leash  Instruction Review Code  2- meets goals/outcomes      Exercise & Equipment Safety: - Individual verbal instruction and demonstration of equipment use and safety with use of the equipment.   Infection Prevention: - Provides verbal and written material to individual with discussion of infection control including proper hand washing and proper equipment cleaning during exercise session. Flowsheet Row Pulmonary Rehab from 12/23/2016 in [facility] Cardiac and Pulmonary Rehab  Date  10/13/16  Educator  LB  Instruction Review Code  2- meets goals/outcomes      Falls Prevention: - Provides verbal and written material to individual with discussion of falls prevention and safety. Flowsheet Row Pulmonary Rehab from 12/23/2016 in [facility] Cardiac and Pulmonary Rehab  Date  10/13/16  Educator  LB  Instruction Review Code  2- meets goals/outcomes      Diabetes: - Individual verbal and written instruction to review signs/symptoms of diabetes, desired ranges of glucose level fasting, after meals and with exercise. Advice that pre and post exercise glucose checks will be done for 3 sessions at entry of program.   Chronic Lung Diseases: - Group verbal and written instruction to review new updates, new respiratory  medications, new advancements in procedures and treatments. Provide informative websites and "800" numbers of self-education. Flowsheet Row Pulmonary Rehab from 12/23/2016 in [facility] Cardiac and Pulmonary Rehab  Date  11/11/16  Educator  LB  Instruction Review Code  2- meets goals/outcomes      Lung Procedures: - Group verbal and written instruction to describe testing methods done to diagnose lung disease. Review the outcome of test results. Describe the treatment choices: Pulmonary Function Tests, ABGs and oximetry.   Energy Conservation: - Provide group verbal and written instruction for methods to conserve energy, plan and organize activities. Instruct on pacing techniques, use of adaptive equipment and posture/positioning to relieve shortness of breath. Flowsheet Row Pulmonary Rehab from 12/23/2016 in [facility] Cardiac and Pulmonary Rehab  Date  12/16/16  Educator  [facility]  Instruction Review Code  2- meets goals/outcomes      Triggers: - Group verbal and written instruction to review types of environmental controls: home humidity, furnaces, filters, dust mite/pet prevention, HEPA vacuums. To discuss weather changes, air quality and the benefits of nasal washing. Flowsheet Row Pulmonary Rehab from 12/23/2016 in [facility] Cardiac and Pulmonary Rehab  Date  12/09/16  Educator  LB  Instruction Review Code  2- meets goals/outcomes      Exacerbations: - Group verbal and written instruction to provide: warning signs, infection symptoms, calling MD promptly, preventive modes, and value of vaccinations. Review: effective airway clearance, coughing and/or vibration techniques. Create an Sports administrator. Flowsheet Row Pulmonary Rehab from 12/23/2016 in [facility] Cardiac and Pulmonary Rehab  Date  12/02/16  Educator  LB  Instruction Review Code  2- meets goals/outcomes      Oxygen: - Individual and group verbal and written instruction on oxygen therapy. Includes supplement oxygen, available portable oxygen systems,  continuous and intermittent flow rates, oxygen safety, concentrators, and Medicare reimbursement for oxygen. Flowsheet Row Pulmonary Rehab from 12/23/2016 in [facility] Cardiac and Pulmonary Rehab  Date  10/13/16  Educator  LB  Instruction Review Code  2- meets goals/outcomes      Respiratory Medications: - Group verbal and written instruction to review medications for lung disease. Drug class, frequency, complications, importance of spacers, rinsing mouth after steroid MDI's, and proper cleaning methods for nebulizers. Flowsheet Row Pulmonary Rehab from 12/23/2016 in [facility] Cardiac and Pulmonary Rehab  Date  10/13/16  Educator  LB  Instruction Review Code  2- meets goals/outcomes      AED/CPR: - Group verbal and written instruction with the use of models to demonstrate the basic use of the AED with the basic ABC's of resuscitation.   Breathing Retraining: - Provides individuals verbal and written instruction on purpose, frequency, and proper technique of diaphragmatic breathing and pursed-lipped breathing. Applies individual practice skills. Flowsheet Row Pulmonary Rehab from 12/23/2016 in [facility] Cardiac and Pulmonary Rehab  Date  10/13/16  Educator  LB  Instruction Review Code  2- meets goals/outcomes      Anatomy and Physiology of the Lungs: - Group verbal and written instruction with the use of models to provide basic lung anatomy and physiology related to function, structure and complications of lung disease.   Heart Failure: - Group verbal and written instruction on the basics of heart failure: signs/symptoms, treatments, explanation of ejection fraction, enlarged heart and cardiomyopathy.   Sleep Apnea: - Individual verbal and written instruction to review Obstructive Sleep Apnea. Review of risk factors, methods for diagnosing and types of masks and machines for OSA.   Anxiety: - Provides group, verbal and written instruction on the correlation between heart/lung disease and  anxiety, treatment options, and management of anxiety. Flowsheet Row Pulmonary Rehab from 12/23/2016 in [facility] Cardiac and Pulmonary Rehab  Date  10/28/16  Educator  [facility]  Instruction Review Code  2- Meets goals/outcomes      Relaxation: - Provides group, verbal and written instruction about the benefits of relaxation for patients with heart/lung disease. Also provides patients with examples of relaxation techniques. Flowsheet Row Pulmonary Rehab from 12/23/2016 in [facility] Cardiac and Pulmonary Rehab  Date  11/25/16  Educator  [facility]  Instruction Review Code  2- Meets goals/outcomes      Knowledge Questionnaire Score:     Knowledge Questionnaire Score - 10/13/16 1146      Knowledge Questionnaire Score   Pre Score 7/10       Core Components/Risk Factors/Patient Goals at Admission:     Personal Goals and Risk Factors at Admission - 10/13/16 1150      Core Components/Risk Factors/Patient Goals on Admission    Weight Management Yes;Weight Loss   Intervention Weight Management: Develop a combined nutrition and exercise program designed to reach desired caloric intake, while maintaining appropriate intake of nutrient and fiber, sodium and fats, and appropriate energy expenditure required for the weight goal.;Weight Management: Provide education and appropriate resources to help participant work on and attain dietary goals.   Admit Weight 177 lb (80.3 kg)   Goal Weight: Short Term 172 lb (78 kg)   Goal Weight: Long Term 167 lb (75.8 kg)   Expected Outcomes Short Term: Continue to assess and modify interventions until short term weight is achieved;Long Term: Adherence to nutrition and physical activity/exercise program aimed toward attainment of established weight goal;Weight Loss: Understanding of general recommendations for a balanced deficit meal plan, which promotes 1-2 lb weight loss per week and includes a negative energy balance of [phone] kcal/d;Understanding recommendations for meals to  include 15-35% energy as protein, 25-35% energy from fat, 35-60% energy from carbohydrates, less than '200mg'$  of dietary cholesterol, 20-35 gm of total fiber daily;Understanding of distribution of calorie intake throughout the day with the consumption of 4-5 meals/snacks   Sedentary Yes   Intervention Provide advice, education, support and counseling about physical activity/exercise needs.;Develop an individualized exercise prescription for aerobic and resistive training based on initial evaluation findings, risk stratification, comorbidities and participant's  personal goals.   Expected Outcomes Achievement of increased cardiorespiratory fitness and enhanced flexibility, muscular endurance and strength shown through measurements of functional capacity and personal statement of participant.   Increase Strength and Stamina Yes   Intervention Provide advice, education, support and counseling about physical activity/exercise needs.;Develop an individualized exercise prescription for aerobic and resistive training based on initial evaluation findings, risk stratification, comorbidities and participant's personal goals.   Expected Outcomes Achievement of increased cardiorespiratory fitness and enhanced flexibility, muscular endurance and strength shown through measurements of functional capacity and personal statement of participant.   Improve shortness of breath with ADL's Yes   Intervention Provide education, individualized exercise plan and daily activity instruction to help decrease symptoms of SOB with activities of daily living.   Expected Outcomes Short Term: Achieves a reduction of symptoms when performing activities of daily living.   Develop more efficient breathing techniques such as purse lipped breathing and diaphragmatic breathing; and practicing self-pacing with activity Yes   Intervention Provide education, demonstration and support about specific breathing techniuqes utilized for more efficient  breathing. Include techniques such as pursed lipped breathing, diaphragmatic breathing and self-pacing activity.   Expected Outcomes Short Term: Participant will be able to demonstrate and use breathing techniques as needed throughout daily activities.   Increase knowledge of respiratory medications and ability to use respiratory devices properly  Yes  ProAir MDI; oxygen 2-4l/m   Intervention Provide education and demonstration as needed of appropriate use of medications, inhalers, and oxygen therapy.   Expected Outcomes Short Term: Achieves understanding of medications use. Understands that oxygen is a medication prescribed by physician. Demonstrates appropriate use of inhaler and oxygen therapy.   Hypertension Yes   Intervention Provide education on lifestyle modifcations including regular physical activity/exercise, weight management, moderate sodium restriction and increased consumption of fresh fruit, vegetables, and low fat dairy, alcohol moderation, and smoking cessation.;Monitor prescription use compliance.   Expected Outcomes Short Term: Continued assessment and intervention until BP is < 140/36m HG in hypertensive participants. < 130/872mHG in hypertensive participants with diabetes, heart failure or chronic kidney disease.;Long Term: Maintenance of blood pressure at goal levels.   Lipids Yes   Intervention Provide education and support for participant on nutrition & aerobic/resistive exercise along with prescribed medications to achieve LDL '70mg'$ , HDL >'40mg'$ .   Expected Outcomes Short Term: Participant states understanding of desired cholesterol values and is compliant with medications prescribed. Participant is following exercise prescription and nutrition guidelines.;Long Term: Cholesterol controlled with medications as prescribed, with individualized exercise RX and with personalized nutrition plan. Value goals: LDL < '70mg'$ , HDL > 40 mg.      Core Components/Risk Factors/Patient Goals  Review:      Goals and Risk Factor Review    Row Name 10/21/16 1357 10/26/16 1113 11/18/16 1516 12/07/16 1116       Core Components/Risk Factors/Patient Goals Review   Personal Goals Review Develop more efficient breathing techniques such as purse lipped breathing and diaphragmatic breathing and practicing self-pacing with activity. Develop more efficient breathing techniques such as purse lipped breathing and diaphragmatic breathing and practicing self-pacing with activity.;Increase Strength and Stamina Sedentary;Increase Strength and Stamina;Improve shortness of breath with ADL's;Develop more efficient breathing techniques such as purse lipped breathing and diaphragmatic breathing and practicing self-pacing with activity.;Increase knowledge of respiratory medications and ability to use respiratory devices properly.;Hypertension;Lipids Increase Strength and Stamina;Develop more efficient breathing techniques such as purse lipped breathing and diaphragmatic breathing and practicing self-pacing with activity.;Lipids;Hypertension;Weight Management/Obesity;Improve shortness of breath with ADL's  Review Reviewed pursed lip breathing technique with Austin Patterson today. Patient tolerated his first session well and stated that it didn'Patterson bother him the next day. He has been using PLB and stated that it is helping him a little.  Austin Patterson has progressed with his exercise goals. He has maintained acceptable O2Sat's with his exercise on 2l/m. Austin Patterson does self-adjust his oxygen liter flow and is very content with the gas E tank for portability. He knows the advantage to continuous flow.  Using PLB and pacing , he is active at his new house and has made a carrier for his O2 tank for his tractor. Today I gave him a spacer for his ProAir with instructions. His BP is acceptable, and he is complaint with his lipid medication.  Patient stated that he has maintained his weight and his energy levels and stamina are  about the same. Stated that weather is his major limiting factor with breathing and SOB, but feels comfortable using PLB to mainage SOB. Blood pressure and lipid panels were reported as being good.      Expected Outcomes Austin Patterson will become more profieicent at using PLB and will eventually become independent in using PLB without queuing. Austin Patterson will attend class regularly and continue to gain increases in strength and stamina. He will continue to use PLB to help control SOB.  Continue progressing with his exercise and knowledge of self-management of his COPD. Patient will continue to attend lungworks regularly and progress towards these goals.        Core Components/Risk Factors/Patient Goals at Discharge (Final Review):      Goals and Risk Factor Review - 12/07/16 1116      Core Components/Risk Factors/Patient Goals Review   Personal Goals Review Increase Strength and Stamina;Develop more efficient breathing techniques such as purse lipped breathing and diaphragmatic breathing and practicing self-pacing with activity.;Lipids;Hypertension;Weight Management/Obesity;Improve shortness of breath with ADL's   Review Patient stated that he has maintained his weight and his energy levels and stamina are about the same. Stated that weather is his major limiting factor with breathing and SOB, but feels comfortable using PLB to mainage SOB. Blood pressure and lipid panels were reported as being good.     Expected Outcomes Patient will continue to attend lungworks regularly and progress towards these goals.       ITP Comments:   Comments: 30 Day Note Review

## 2017-01-04 ENCOUNTER — Encounter: Payer: Medicaid Other | Admitting: *Deleted

## 2017-01-04 DIAGNOSIS — J449 Chronic obstructive pulmonary disease, unspecified: Secondary | ICD-10-CM | POA: Diagnosis not present

## 2017-01-04 NOTE — Progress Notes (Signed)
Daily Session Note  Patient Details  Name: KARDELL VIRGIL MRN: [id number] Date of Birth: November 19, 1955 Referring Provider:     Pulmonary Rehab from 10/13/2016 in [facility] Cardiac and Pulmonary Rehab  Referring Provider  Laverle Hobby MD      Encounter Date: 01/04/2017  Check In:     Session Check In - 01/04/17 1126      Check-In   Location [facility]-Cardiac & Pulmonary Rehab   Staff Present Earlean Shawl, BS, ACSM CEP, Exercise Physiologist;Amanda Oletta Darter, BA, ACSM CEP, Exercise Physiologist;Laureen Owens Shark, BS, RRT, Respiratory Therapist   Supervising physician immediately available to respond to emergencies [facility] immediately available ER MD   Physician(s) Reita Cliche and Alfred Levins   Medication changes reported     No   Fall or balance concerns reported    No   Warm-up and Cool-down Performed as group-led Location manager Performed Yes   VAD Patient? No     Pain Assessment   Currently in Pain? No/denies   Multiple Pain Sites No         History  Smoking Status  . Former Smoker  . Quit date: 10/14/2015  Smokeless Tobacco  . Former Systems developer    Goals Met:  Proper associated with RPD/PD & O2 Sat Independence with exercise equipment Exercise tolerated well Strength training completed today  Goals Unmet:  Not Applicable  Comments: Pt able to follow exercise prescription today without complaint.  Will continue to monitor for progression.    Dr. Emily Filbert is Medical Director for [place] and [facility]monary Rehabilitation.

## 2017-01-06 DIAGNOSIS — J449 Chronic obstructive pulmonary disease, unspecified: Secondary | ICD-10-CM

## 2017-01-06 NOTE — Progress Notes (Signed)
Daily Session Note  Patient Details  Name: Austin Patterson MRN: [id number] Date of Birth: 03/16/1956 Referring Provider:     Pulmonary Rehab from 10/13/2016 in [facility] Cardiac and Pulmonary Rehab  Referring Provider  Ramachandran, Pradeep MD      Encounter Date: 01/06/2017  Check In:     Session Check In - 01/06/17 1040      Check-In   Location [facility]-Cardiac & Pulmonary Rehab   Staff Present Jessica Hawkins, MA, ACSM RCEP, Exercise Physiologist;Amanda Sommer, BA, ACSM CEP, Exercise Physiologist;Laureen Brown, BS, RRT, Respiratory Therapist   Supervising physician immediately available to respond to emergencies [facility] immediately available ER MD   Physician(s) Paduchowski and Williams   Medication changes reported     No   Fall or balance concerns reported    No   Warm-up and Cool-down Performed as group-led instruction   Resistance Training Performed Yes   VAD Patient? No     Pain Assessment   Currently in Pain? No/denies         History  Smoking Status  . Former Smoker  . Quit date: 10/14/2015  Smokeless Tobacco  . Former User    Goals Met:  Proper associated with RPD/PD & O2 Sat Independence with exercise equipment Exercise tolerated well Strength training completed today  Goals Unmet:  Not Applicable  Comments: Pt able to follow exercise prescription today without complaint.  Will continue to monitor for progression.    Dr. Mark Miller is Medical Director for [facility] and [facility]. 

## 2017-01-07 ENCOUNTER — Encounter: Payer: Self-pay | Admitting: Internal Medicine

## 2017-01-07 ENCOUNTER — Ambulatory Visit (INDEPENDENT_AMBULATORY_CARE_PROVIDER_SITE_OTHER): Payer: Medicaid Other | Admitting: Internal Medicine

## 2017-01-07 VITALS — BP 142/86 | HR 87 | Wt 188.0 lb

## 2017-01-07 DIAGNOSIS — J9611 Chronic respiratory failure with hypoxia: Secondary | ICD-10-CM

## 2017-01-07 MED ORDER — UMECLIDINIUM-VILANTEROL 62.5-25 MCG/INH IN AEPB: INHALATION_SPRAY | RESPIRATORY_TRACT | 11 refills | Status: DC

## 2017-01-07 NOTE — Addendum Note (Signed)
Addended by: Renelda Mom on: 01/07/2017 02:31 PM   Modules accepted: Orders

## 2017-01-07 NOTE — Patient Instructions (Signed)
Refill Anoro Wean prednisone: 5 mg every other day for 2 weeks and assess breathing then stop Re-aplly for [employer]

## 2017-01-07 NOTE — Progress Notes (Signed)
* [place] [facility]   Date: 01/07/2017  MRN# [id number] Austin Patterson April 06, 1956  CC follow up SOB/COPD HPI:   The patient is a 61 year old male with a history advanced COPD, interstitial lung disease. He has been on Anoro.  Doing well on Anoro  Since his last visit he feels that his breathing has been about the same. He is no longer working and is now on disability, he is on oxygen at 2L at rest and 4L with activity. He has never gone through pulmonary rehab. He is currently on prednisone 5 mg, He is taking esbriet 3 tabs 3 times per day.    An alpha-1 antitrypsin serum level was 158. 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   Pulmonary function testing 01/29/16: Spirometry FEV1 was 1.69 L which is 52% of predicted, there was no significant improvement with bronchodilator therapy. The FVC was 61% of predicted, the FEV to FVC ratio was 65%. Lung volumes: Residual volume was 60% of predicted, TLC was 59% of predicted, RV/TLC ratio was 112% of predicted. Diffusion capacity: 26% of predicted. Interpretation. 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   CT chest 01/29/16: continued severe interstitial changes throughout both lungs, worst in the upper lobes. The previously seen pneumonic changes from 12/11/15 in the left base and to a lesser degree in the right base have improved. significant bullous emphysema as well as advancing interstitial changes seen onCT chest 12/11/15, in particular his emphysematous changes on the CT appeared to have advanced significantly since the previous CT, which was 8 months prior.    Medication:   Outpatient Encounter Prescriptions as of 01/07/2017  Medication Sig  . albuterol (PROVENTIL HFA;VENTOLIN HFA) 108 (90 Base) MCG/ACT inhaler Inhale 2 puffs into the lungs every 4 (four) hours as needed for wheezing  or shortness of breath.  [name] amLODipine (NORVASC) 10 MG tablet Take 1 tablet (10 mg total) by mouth daily.  [name] aspirin EC 81 MG tablet Take 1 tablet (81 mg total) by mouth daily.  [name] atorvastatin (LIPITOR) 40 MG tablet Take 1 tablet (40 mg total) by mouth at bedtime.  . calcium-vitamin D (OSCAL WITH D) 500-200 MG-UNIT tablet Take 2 tablets by mouth daily with breakfast.  . cetirizine (ZYRTEC) 10 MG tablet Take 10 mg by mouth at bedtime.  . clopidogrel (PLAVIX) 75 MG tablet Take 1 tablet (75 mg total) by mouth daily.  [name] losartan-hydrochlorothiazide (HYZAAR) 100-12.5 MG tablet Take 1 tablet by mouth daily.  . metoprolol succinate (TOPROL-XL) 50 MG 24 hr tablet Take 1 tablet (50 mg total) by mouth daily.  . Multiple Vitamin (MULTIVITAMIN WITH MINERALS) TABS tablet Take 1 tablet by mouth daily.  . pantoprazole (PROTONIX) 40 MG tablet Take 1 tablet (40 mg total) by mouth 2 (two) times daily.  . predniSONE (DELTASONE) 5 MG tablet Take 1.5 tablet daily (Patient taking differently: Take 7.5 mg by mouth daily. Take 1.5 tablet daily)  . umeclidinium-vilanterol (ANORO ELLIPTA) 62.5-25 MCG/INH AEPB INHALE ONE PUFF INTO THE LUNGS DAILY (Patient not taking: Reported on 10/13/2016)   No facility-administered encounter medications on file as of 01/07/2017.      Allergies:  Patient has no known allergies.  Review of Systems: Gen:  Denies  fever, sweats. HEENT: Denies blurred vision. Cvc:  No dizziness, chest pain or heaviness Resp:   Denies cough or sputum porduction. Other:  All other systems were reviewed and  found to be negative other than what is mentioned in the HPI.   Physical Examination:   BP (!) 142/86 (BP Location: Left Arm, Cuff Size: Normal)   Pulse 87   Wt 188 lb (85.3 kg)   SpO2 96%   BMI 29.44 kg/m   General Appearance: No distress  Neuro:without focal findings,   HEENT: PERRLA, EOM intact. Pulmonary: scattered crackles decreased breath sounds bilaterally.  CardiovascularNormal  S1,S2.  No m/r/g.   Extremities: normal, no cyanosis, clubbing.     Assessment and Plan: 61 year old male with bullous emphysema, interstitial lung disease, chronic respiratory failure. 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 - advancing interstitial lung disease, discussed the diagnosis of pulmonary fibrosis, as well as its progressive nature. 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  -Cut down prednisone to 5mg  daily,  --Continue esbriet as prescribed.  -prednisone plan: 5 mg every other day for next 2 weeks and assess resp status and d/c if able    COPD/Bullous emphysema.  Severe bullous emphysema, significantly advanced on most recent CT chest over the last several months. - Maintain O2 saturations greater than 88% - Anoro once daily. -Continue to wean down prednisone.   Alpha-1 antitrypsin deficiency. -The patient's phenotype is MZ, alpha-1 antitrypsin replacement therapy was not approved by insurance, and previous application, will re-apply.   Acute hypoxic respiratory failure-chronic.  -Secondary to above, continue oxygen.    Nicotine abuse. -He quit smoking about 1 year ago, discussed importance of continued smoking cessation. 3 min spent in counseling.    Patient satisfied with Plan of action and management. All questions answered  Corrin Parker, M.D.  [facility]  Medical Director [place] Director [facility] Cardio-Pulmonary Department

## 2017-01-08 ENCOUNTER — Encounter: Payer: Medicaid Other | Admitting: *Deleted

## 2017-01-08 DIAGNOSIS — J449 Chronic obstructive pulmonary disease, unspecified: Secondary | ICD-10-CM

## 2017-01-08 NOTE — Progress Notes (Signed)
Daily Session Note  Patient Details  Name: Austin Patterson MRN: [id number] Date of Birth: 1956/02/17 Referring Provider:     Pulmonary Rehab from 10/13/2016 in [facility] Cardiac and Pulmonary Rehab  Referring Provider  Laverle Hobby MD      Encounter Date: 01/08/2017  Check In:     Session Check In - 01/08/17 1117      Check-In   Location [facility]-Cardiac & Pulmonary Rehab   Staff Present Nyoka Cowden, RN, BSN, [place], BS, RRT, Respiratory Therapist;Other  Darel Hong, RN BSN   Supervising physician immediately available to respond to emergencies [facility] immediately available ER MD   Physician(s) Drs. Alfred Levins and [place]   Medication changes reported     No   Fall or balance concerns reported    No   Tobacco Cessation No Change   Warm-up and Cool-down Performed as group-led Location manager Performed Yes   VAD Patient? No     Pain Assessment   Currently in Pain? No/denies   Multiple Pain Sites No         History  Smoking Status  . Former Smoker  . Quit date: 10/14/2015  Smokeless Tobacco  . Former Systems developer    Goals Met:  Proper associated with RPD/PD & O2 Sat Independence with exercise equipment Using PLB without cueing & demonstrates good technique Exercise tolerated well Strength training completed today  Goals Unmet:  Not Applicable  Comments: Pt able to follow exercise prescription today without complaint.  Will continue to monitor for progression.    Dr. Emily Filbert is Medical Director for [facility] and [facility].

## 2017-01-11 ENCOUNTER — Encounter: Payer: Medicaid Other | Admitting: *Deleted

## 2017-01-11 DIAGNOSIS — J449 Chronic obstructive pulmonary disease, unspecified: Secondary | ICD-10-CM

## 2017-01-11 NOTE — Progress Notes (Signed)
Daily Session Note  Patient Details  Name: Austin Patterson MRN: [id number] Date of Birth: Nov 20, 1955 Referring Provider:     Pulmonary Rehab from 10/13/2016 in [facility] Cardiac and Pulmonary Rehab  Referring Provider  Laverle Hobby MD      Encounter Date: 01/11/2017  Check In:     Session Check In - 01/11/17 1020      Check-In   Location [facility]-Cardiac & Pulmonary Rehab   Staff Present Nada Maclachlan, BA, ACSM CEP, Exercise Physiologist;Laureen Owens Shark, BS, RRT, Respiratory Bertis Ruddy, BS, ACSM CEP, Exercise Physiologist   Supervising physician immediately available to respond to emergencies [facility] immediately available ER MD   Physician(s) Clearnce Hasten and Jimmye Norman   Medication changes reported     No   Fall or balance concerns reported    No   Warm-up and Cool-down Performed as group-led instruction   Resistance Training Performed Yes   VAD Patient? No     Pain Assessment   Currently in Pain? No/denies   Multiple Pain Sites No         History  Smoking Status  . Former Smoker  . Quit date: 10/14/2015  Smokeless Tobacco  . Former Systems developer    Goals Met:  Proper associated with RPD/PD & O2 Sat Independence with exercise equipment Exercise tolerated well Strength training completed today  Goals Unmet:  Not Applicable  Comments: Pt able to follow exercise prescription today without complaint.  Will continue to monitor for progression.    Dr. Emily Filbert is Medical Director for [facility] and [facility].

## 2017-01-13 DIAGNOSIS — J449 Chronic obstructive pulmonary disease, unspecified: Secondary | ICD-10-CM | POA: Diagnosis not present

## 2017-01-13 NOTE — Progress Notes (Signed)
Daily Session Note  Patient Details  Name: Austin Patterson MRN: [id number] Date of Birth: Jul 22, 1956 Referring Provider:     Pulmonary Rehab from 10/13/2016 in [facility] Cardiac and Pulmonary Rehab  Referring Provider  Laverle Hobby MD      Encounter Date: 01/13/2017  Check In:     Session Check In - 01/13/17 1229      Check-In   Location [facility]-Cardiac & Pulmonary Rehab   Staff Present Carson Myrtle, BS, RRT, Respiratory Lennie Hummer, MA, ACSM RCEP, Exercise Physiologist;Christelle Igoe Oletta Darter, BA, ACSM CEP, Exercise Physiologist   Supervising physician immediately available to respond to emergencies [facility] immediately available ER MD   Physician(s) Kerman Passey and Jimmye Norman   Medication changes reported     No   Fall or balance concerns reported    No   Warm-up and Cool-down Performed as group-led instruction   Resistance Training Performed Yes   VAD Patient? No     Pain Assessment   Currently in Pain? No/denies           Exercise Prescription Changes - 01/13/17 1200      Response to Exercise   Blood Pressure (Admit) 104/64   Blood Pressure (Exercise) 126/64   Blood Pressure (Exit) 126/70   Heart Rate (Admit) 105 bpm   Heart Rate (Exercise) 125 bpm   Heart Rate (Exit) 108 bpm   Oxygen Saturation (Admit) 96 %   Oxygen Saturation (Exercise) 95 %   Oxygen Saturation (Exit) 97 %   Rating of Perceived Exertion (Exercise) 13   Perceived Dyspnea (Exercise) 2   Duration Progress to 45 minutes of aerobic exercise without signs/symptoms of physical distress   Intensity THRR unchanged     Progression   Progression Continue to progress workloads to maintain intensity without signs/symptoms of physical distress.     Resistance Training   Training Prescription Yes   Weight 4   Reps 10-15     Oxygen   Oxygen Continuous   Liters 3     Treadmill   MPH 1.8   Grade 1   Minutes 15   METs 2.63     Recumbant Elliptical   Minutes 15   METs 2.3     T5  Nustep   Level 3   Minutes 15      History  Smoking Status  . Former Smoker  . Quit date: 10/14/2015  Smokeless Tobacco  . Former Systems developer    Goals Met:  Proper associated with RPD/PD & O2 Sat Independence with exercise equipment Exercise tolerated well Strength training completed today  Goals Unmet:  Not Applicable  Comments: Pt able to follow exercise prescription today without complaint.  Will continue to monitor for progression.    Dr. Emily Filbert is Medical Director for [facility] and [facility].

## 2017-01-18 ENCOUNTER — Encounter: Payer: Medicaid Other | Attending: Internal Medicine

## 2017-01-18 DIAGNOSIS — J449 Chronic obstructive pulmonary disease, unspecified: Secondary | ICD-10-CM | POA: Insufficient documentation

## 2017-01-25 ENCOUNTER — Encounter: Payer: Self-pay | Admitting: Respiratory Therapy

## 2017-01-25 DIAGNOSIS — J449 Chronic obstructive pulmonary disease, unspecified: Secondary | ICD-10-CM

## 2017-01-25 NOTE — Progress Notes (Signed)
Pulmonary Individual Treatment Plan  Patient Details  Name: CARLITO BOGERT MRN: [id number] Date of Birth: January 01, 1956 Referring Provider:     Pulmonary Rehab from 10/13/2016 in [facility] Cardiac and Pulmonary Rehab  Referring Provider  Laverle Hobby MD      Initial Encounter Date:    Pulmonary Rehab from 10/13/2016 in [facility] Cardiac and Pulmonary Rehab  Date  10/13/16  Referring Provider  Laverle Hobby MD      Visit Diagnosis: COPD, moderate ([place])  Patient's Home Medications on Admission:  Current Outpatient Prescriptions:    albuterol (PROVENTIL HFA;VENTOLIN HFA) 108 (90 Base) MCG/ACT inhaler, Inhale 2 puffs into the lungs every 4 (four) hours as needed for wheezing or shortness of breath., Disp: 3 Inhaler, Rfl: 3   amLODipine (NORVASC) 10 MG tablet, Take 1 tablet (10 mg total) by mouth daily., Disp: 90 tablet, Rfl: 3   aspirin EC 81 MG tablet, Take 1 tablet (81 mg total) by mouth daily., Disp: 90 tablet, Rfl: 3   atorvastatin (LIPITOR) 40 MG tablet, Take 1 tablet (40 mg total) by mouth at bedtime., Disp: 90 tablet, Rfl: 3   calcium-vitamin D (OSCAL WITH D) 500-200 MG-UNIT tablet, Take 2 tablets by mouth daily with breakfast., Disp: 60 tablet, Rfl: 0   cetirizine (ZYRTEC) 10 MG tablet, Take 10 mg by mouth at bedtime., Disp: , Rfl:    clopidogrel (PLAVIX) 75 MG tablet, Take 1 tablet (75 mg total) by mouth daily., Disp: 90 tablet, Rfl: 3   losartan-hydrochlorothiazide (HYZAAR) 100-12.5 MG tablet, Take 1 tablet by mouth daily., Disp: 90 tablet, Rfl: 3   metoprolol succinate (TOPROL-XL) 50 MG 24 hr tablet, Take 1 tablet (50 mg total) by mouth daily., Disp: 90 tablet, Rfl: 3   Multiple Vitamin (MULTIVITAMIN WITH MINERALS) TABS tablet, Take 1 tablet by mouth daily., Disp: 30 tablet, Rfl: 0   pantoprazole (PROTONIX) 40 MG tablet, Take 1 tablet (40 mg total) by mouth 2 (two) times daily., Disp: 90 tablet, Rfl: 3   predniSONE (DELTASONE) 5 MG tablet, Take 1.5 tablet  daily (Patient taking differently: Take 7.5 mg by mouth daily. 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 Latest Ref Rng & Units 10/14/2015 12/09/2015   Hemoglobin A1c 4.0 - 6.0 % - 4.9   HCO3 21.0 - 28.0 mEq/L 29.9(H) -       ADL [facility]:     Pulmonary Assessment Scores    Row Name 10/13/16 1147 01/06/17 1352       ADL UCSD   ADL Phase  -- Mid    SOB Score total 38 39    Rest 0 1    Walk 3 1    Stairs 4 4    Bath 2 3    Dress 0 0    Shop 2 3       Pulmonary Function Assessment:     Pulmonary Function Assessment - 10/13/16 1146      Pulmonary  Function Tests   RV% 68 %   DLCO% 26 %     Initial Spirometry Results   FVC% 61 %   FEV1% 52 %   FEV1/FVC Ratio 65   Comments Test date 01/29/16     Post Bronchodilator Spirometry Results   FVC% 61 %   FEV1% 52 %   FEV1/FVC Ratio 65     Breath   Shortness of Breath Yes;Limiting activity      Exercise Target Goals:    Exercise Program Goal: Individual exercise prescription set with THRR, safety & activity barriers. Participant demonstrates ability to understand and report RPE using BORG scale, to self-measure pulse accurately, and to acknowledge the importance of the exercise prescription.  Exercise Prescription Goal: Starting with aerobic activity 30 plus minutes a day, 3 days per week for initial exercise prescription. Provide home exercise prescription and guidelines that participant  acknowledges understanding prior to discharge.  Activity Barriers & Risk Stratification:     Activity Barriers & Cardiac Risk Stratification - 10/13/16 1145      Activity Barriers & Cardiac Risk Stratification   Activity Barriers Shortness of Breath;Deconditioning   Cardiac Risk Stratification Moderate      6 Minute Walk:     6 Minute Walk    Row Name 10/13/16 1155 12/18/16 1157       6 Minute Walk   Phase Initial  --    Distance 1486 feet 1020 feet    Distance % Change  -- 3 %    Walk Time 6 minutes 6 minutes    # of Rest Breaks 0 0    MPH 2.81 1.93    METS 3.95 3.21    RPE 13 15    Perceived Dyspnea  3 2    VO2 Peak 13.81 11.25    Symptoms No No    Resting HR 100 bpm 108 bpm    Resting BP 124/72 140/72    Max Ex. HR 117 bpm 119 bpm    Max Ex. BP 136/74 152/72    2 Minute Post BP 126/70  --      Interval HR   Baseline HR 100 108    1 Minute HR 109 110    2 Minute HR 112  --    3 Minute HR 113 118    4 Minute HR 113 119    5 Minute HR 117  --    6 Minute HR 116 116    2 Minute Post HR 99  --    Interval Heart Rate? Yes  --      Interval Oxygen   Interval Oxygen? Yes Yes    Baseline Oxygen Saturation % 97 % 97 %    Baseline Liters of Oxygen 2 L 3 L    1 Minute Oxygen Saturation % 95 % 95 %    1 Minute Liters of Oxygen 2 L 3 L    2 Minute Oxygen Saturation % 98 %  --    2 Minute Liters of Oxygen 2 L 3 L    3 Minute Oxygen Saturation % 98 % 94 %    3 Minute Liters of Oxygen 2 L 3 L    4 Minute Oxygen Saturation % 95 % 94 %    4 Minute Liters of Oxygen 2 L 3 L    5 Minute Oxygen Saturation % 96 %  --    5 Minute Liters of Oxygen 2 L 3 L    6  Minute Oxygen Saturation % 96 % 95 %    6 Minute Liters of Oxygen 2 L 3 L    2 Minute Post Oxygen Saturation % 96 %  --    2 Minute Post Liters of Oxygen 2 L 3 L      Oxygen Initial Assessment:     Oxygen Initial Assessment - 12/28/16 0902      Home Oxygen   Home Oxygen Device Portable Concentrator;E-Tanks    Sleep Oxygen Prescription Continuous   Liters per minute --  2-4   Home Exercise Oxygen Prescription Continuous   Liters per minute --  2-4   Home at Rest Exercise Oxygen Prescription Continuous   Liters per minute --  2-4   Compliance with Home Oxygen Use Yes     Initial 6 min Walk   Oxygen Used Continuous;E-Tanks   Liters per minute 2   Resting Oxygen Saturation  during 6 min walk 97 %   Exercise Oxygen Saturation  during 6 min walk 95 %     Program Oxygen Prescription   Program Oxygen Prescription Continuous;E-Tanks   Liters per minute --  2-3     Intervention   Short Term Goals To learn and exhibit compliance with exercise, home and travel O2 prescription;To Learn and understand importance of maintaining oxygen saturations>88%;To learn and demonstrate proper use of respiratory medications;To learn and understand importance of monitoring SPO2 with pulse oximeter and demonstrate accurate use of the pulse oximeter.;To learn and demonstrate proper purse lipped breathing techniques or other breathing techniques.   Long  Term Goals Exhibits compliance with exercise, home and travel O2 prescription;Maintenance of O2 saturations>88%;Compliance with respiratory medication;Verbalizes importance of monitoring SPO2 with pulse oximeter and return demonstration;Exhibits proper breathing techniques, such as purse lipped breathing or other method taught during program session;Demonstrates proper use of MDIs      Oxygen Re-Evaluation:     Oxygen Re-Evaluation    Row Name 01/06/17 1353             Program Oxygen Prescription   Program Oxygen Prescription Continuous;E-Tanks       Liters per minute 3         Home Oxygen   Home Oxygen Device Home Concentrator;E-Tanks       Sleep Oxygen Prescription Continuous       Liters per minute --  2-4l/m       Home Exercise Oxygen Prescription Continuous  2-4l/m       Home at Rest Exercise Oxygen Prescription Continuous       Liters per  minute 2       Compliance with Home Oxygen Use Yes         Goals/Expected Outcomes   Comments Mr Bosque manages his oxygen well - adjusts with activity to 4l/m. He has secured his E-tank to his tractor for yardwork use.          Oxygen Discharge (Final Oxygen Re-Evaluation):     Oxygen Re-Evaluation - 01/06/17 1353      Program Oxygen Prescription   Program Oxygen Prescription Continuous;E-Tanks   Liters per minute 3     Home Oxygen   Home Oxygen Device Home Concentrator;E-Tanks   Sleep Oxygen Prescription Continuous   Liters per minute --  2-4l/m   Home Exercise Oxygen Prescription Continuous  2-4l/m   Home at Rest Exercise Oxygen Prescription Continuous   Liters per minute 2   Compliance with Home Oxygen Use Yes     Goals/Expected Outcomes  Comments Mr Ono manages his oxygen well - adjusts with activity to 4l/m. He has secured his E-tank to his tractor for yardwork use.      Initial Exercise Prescription:     Initial Exercise Prescription - 11/06/16 0900      Oxygen   Oxygen Continuous   Liters 2     Treadmill   MPH 1.5   Grade 0.5   Minutes 15   METs 2.25     T5 Nustep   Level 3   Minutes 15   METs 2     Prescription Details   Frequency (times per week) 3   Duration Progress to 45 minutes of aerobic exercise without signs/symptoms of physical distress     Intensity   THRR 40-80% of Max Heartrate 124-148   Ratings of Perceived Exertion 11-15   Perceived Dyspnea 0-4     Progression   Progression Continue to progress workloads to maintain intensity without signs/symptoms of physical distress.     Resistance Training   Training Prescription Yes   Weight 3   Reps 10-15      Perform Capillary Blood Glucose checks as needed.  Exercise Prescription Changes:     Exercise Prescription Changes    Row Name 10/13/16 1100 10/22/16 1200 11/18/16 1200 11/19/16 1200 12/03/16 1200     Response to Exercise   Blood Pressure (Admit) 124/72  126/80  -- 110/64 138/76   Blood Pressure (Exercise) 136/74 156/84  -- 138/64 136/74   Blood Pressure (Exit) 126/70 126/70  -- 150/82 134/70   Heart Rate (Admit) 100 bpm 109 bpm  -- 101 bpm 108 bpm   Heart Rate (Exercise) 117 bpm 123 bpm  -- 117 bpm 119 bpm   Heart Rate (Exit) 99 bpm 110 bpm  -- 91 bpm 88 bpm   Oxygen Saturation (Admit) 97 % 95 %  -- 96 % 97 %   Oxygen Saturation (Exercise) 95 % 95 %  -- 93 % 95 %   Oxygen Saturation (Exit) 96 % 92 %  -- 95 % 95 %   Rating of Perceived Exertion (Exercise) 13 13  -- 13 13   Perceived Dyspnea (Exercise) 3 3  -- 1 1   Symptoms none none  -- none  --   Comments  --  -- Home exercise guidelines given 11/18/2016  --  --   Duration  --  --  --  -- Progress to 45 minutes of aerobic exercise without signs/symptoms of physical distress   Intensity  --  --  --  -- THRR unchanged     Progression   Progression  -- Continue to progress workloads to maintain intensity without signs/symptoms of physical distress.  -- Continue to progress workloads to maintain intensity without signs/symptoms of physical distress. Continue to progress workloads to maintain intensity without signs/symptoms of physical distress.     Resistance Training   Training Prescription  -- Yes  -- Yes Yes   Weight  -- 3  -- 3 4   Reps  -- 10-15  --  -- 10-15     Interval Training   Interval Training  -- No  -- No No     Oxygen   Oxygen  -- Continuous  -- Continuous Continuous   Liters  -- 2  -- 2 2     Treadmill   MPH  --  --  -- 1.5 1.5   Grade  --  --  -- 0.5 0.5   Minutes  --  --  --  15 15   METs  --  --  -- 2.25 2.25     Recumbant Elliptical   Level  -- 2  -- 2 3   RPM  -- 48  -- 39  --   Minutes  -- 15  -- 15 15   METs  -- 1.9  -- 1.8 1.8     T5 Nustep   Level  -- 3  --  --  --   Minutes  -- 15  --  --  --   METs  -- 1.9  --  --  --     Home Exercise Plan   Plans to continue exercise at  --  -- Home  Walking  --  --   Frequency  --  -- Add 2 additional days  to program exercise sessions.  --  --     Exercise Review   Progression --  walk test results  --  --  --  --   Row Name 12/17/16 1100 12/31/16 1500 01/13/17 1200         Response to Exercise   Blood Pressure (Admit) 132/78 120/72 104/64     Blood Pressure (Exercise) 132/70 148/78 126/64     Blood Pressure (Exit) 124/60 114/66 126/70     Heart Rate (Admit) 109 bpm 111 bpm 105 bpm     Heart Rate (Exercise) 106 bpm 118 bpm 125 bpm     Heart Rate (Exit) 90 bpm 101 bpm 108 bpm     Oxygen Saturation (Admit) 97 % 96 % 96 %     Oxygen Saturation (Exercise) 96 % 96 % 95 %     Oxygen Saturation (Exit) 97 % 98 % 97 %     Rating of Perceived Exertion (Exercise) '13 13 13     ' Perceived Dyspnea (Exercise) '1 1 2     ' Duration Continue with 45 min of aerobic exercise without signs/symptoms of physical distress. Continue with 45 min of aerobic exercise without signs/symptoms of physical distress. Progress to 45 minutes of aerobic exercise without signs/symptoms of physical distress     Intensity THRR unchanged THRR unchanged THRR unchanged       Progression   Progression Continue to progress workloads to maintain intensity without signs/symptoms of physical distress. Continue to progress workloads to maintain intensity without signs/symptoms of physical distress. Continue to progress workloads to maintain intensity without signs/symptoms of physical distress.       Resistance Training   Training Prescription Yes Yes Yes     Weight '4 4 4     ' Reps 10-15 10-15 10-15       Interval Training   Interval Training No Yes  --     Equipment  -- T5 Nustep  --       Oxygen   Oxygen Continuous Continuous Continuous     Liters '2 2 3       ' Treadmill   MPH  -- 1.5 1.8     Grade  -- 0.5 1     Minutes  -- 15 15     METs  -- 2.25 2.63       Recumbant Elliptical   Level 3 3  --     Minutes '15 15 15     ' METs 1.9 1.9 2.3       T5 Nustep   Level '3 3 3     ' Minutes '15 15 15     ' METs 2 2  --  Exercise Comments:     Exercise Comments    Row Name 10/13/16 1200 10/21/16 1357 11/06/16 0926 11/18/16 1241 11/19/16 1258   Exercise Comments Rusty wants to reduce the amount of oxygen he is wearing regularly. First full day of exercise!  Patient was oriented to gym and equipment including functions, settings, policies, and procedures.  Patient's individual exercise prescription and treatment plan were reviewed.  All starting workloads were established based on the results of the 6 minute walk test done at initial orientation visit.  The plan for exercise progression was also introduced and progression will be customized based on patient's performance and goals. Hurman is progressing well with exercise. Reviewed home exercises with patient today. Patient plans to walk at home for exercise. Also discussed RPE, pulse, THR, restrictions, s/sx when to call MD, and weather considerations. He verbalized understanding.  Rusty is toleratiing exercise well and staff will monitor his increase in workloads.   [place] Name 12/03/16 1258 12/17/16 1111 12/31/16 1544 01/13/17 1233     Exercise Comments Rusty continues to progress well and has increased to 4 lb for strength training. Rusty is tolerating exercise well and staff will add interval training to one machine. Rusty has tolerated the addition of intervals to his exercise well. Rusty continues to progress in MET levels and has added intensity to the TM.       Exercise Goals and Review:   Exercise Goals Re-Evaluation :   Discharge Exercise Prescription (Final Exercise Prescription Changes):     Exercise Prescription Changes - 01/13/17 1200      Response to Exercise   Blood Pressure (Admit) 104/64   Blood Pressure (Exercise) 126/64   Blood Pressure (Exit) 126/70   Heart Rate (Admit) 105 bpm   Heart Rate (Exercise) 125 bpm   Heart Rate (Exit) 108 bpm   Oxygen Saturation (Admit) 96 %   Oxygen Saturation (Exercise) 95 %   Oxygen Saturation (Exit)  97 %   Rating of Perceived Exertion (Exercise) 13   Perceived Dyspnea (Exercise) 2   Duration Progress to 45 minutes of aerobic exercise without signs/symptoms of physical distress   Intensity THRR unchanged     Progression   Progression Continue to progress workloads to maintain intensity without signs/symptoms of physical distress.     Resistance Training   Training Prescription Yes   Weight 4   Reps 10-15     Oxygen   Oxygen Continuous   Liters 3     Treadmill   MPH 1.8   Grade 1   Minutes 15   METs 2.63     Recumbant Elliptical   Minutes 15   METs 2.3     T5 Nustep   Level 3   Minutes 15      Nutrition:  Target Goals: Understanding of nutrition guidelines, daily intake of sodium <1549m, cholesterol <2065m calories 30% from fat and 7% or less from saturated fats, daily to have 5 or more servings of fruits and vegetables.  Biometrics:     Pre Biometrics - 10/13/16 1200      Pre Biometrics   Height 5' 6.75" (1.695 m)   Weight 177 lb (80.3 kg)   Waist Circumference 38 inches   Hip Circumference 39.5 inches   Waist to Hip Ratio 0.96 %   BMI (Calculated) 28       Nutrition Therapy Plan and Nutrition Goals:   Nutrition Discharge: Rate Your Plate Scores:   Nutrition Goals Re-Evaluation:   Nutrition Goals Discharge (Final  Nutrition Goals Re-Evaluation):   Psychosocial: Target Goals: Acknowledge presence or absence of significant depression and/or stress, maximize coping skills, provide positive support system. Participant is able to verbalize types and ability to use techniques and skills needed for reducing stress and depression.   Initial Review & Psychosocial Screening:     Initial Psych Review & Screening - 10/13/16 1153      Family Dynamics   Good Support System? Yes   Comments Mr Sangiovanni does have good support from his friends. He is in the middle of a divorce from his wife of some twenty years. Mr Strout states he misses going to  a job and some activities he use to perform. He is excited about [facility] and improving his activity level.      Barriers   Psychosocial barriers to participate in program The patient should benefit from training in stress management and relaxation.     Screening Interventions   Interventions Encouraged to exercise;Program counselor consult      Quality of Life Scores:     Quality of Life - 10/13/16 1157      Quality of Life Scores   Health/Function Pre 20.69 %   Socioeconomic Pre 19.14 %   Psych/Spiritual Pre 20.86 %   Family Pre 19.6 %   GLOBAL Pre 20.26 %      PHQ-9: Recent Review Flowsheet Data    Depression screen [facility] 2/9 10/13/2016   Decreased Interest 3   Down, Depressed, Hopeless 2   PHQ - 2 Score 5   Altered sleeping 0   Tired, decreased energy 3   Change in appetite 3   Feeling bad or failure about yourself  0   Trouble concentrating 3   Moving slowly or fidgety/restless 0   Suicidal thoughts 1   PHQ-9 Score 15   Difficult doing work/chores Extremely dIfficult     Interpretation of Total Score  Total Score Depression Severity:  1-4 = Minimal depression, 5-9 = Mild depression, 10-14 = Moderate depression, 15-19 = Moderately severe depression, 20-27 = Severe depression   Psychosocial Evaluation and Intervention:     Psychosocial Evaluation - 10/28/16 1200      Psychosocial Evaluation & Interventions   Interventions Encouraged to exercise with the program and follow exercise prescription;Relaxation education;Stress management education   Comments Counselor met with Mr. Darene Lamer today for initial psychosocial evaluation.  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  Mr. Darene Lamer has a limited support system as he reports he is in the midst of separating from his spouse of 10 years.  H has some close friends within the community that are supportive and helpful in his life.  Mr. Darene Lamer states he sleeps okay and has a good appetite.  He denies a  history of depression or anxiety or current symptoms; although his PHQ-9 scores were "15" at the time of admission.  counselor processed this with Mr. T reporting the Steroids he was on impacted his sleep and appetite and since coming into this program he feels more motivated and more energy.  Counselor encouraged him to consistently exercise since he has already experienced such positive benefits in a short time.  Mr. Darene Lamer also states his mood is positive currently because he is making some healthier choices for his life with the separation and with his health and working out.  Mr. Darene Lamer has goals to breathe better and possibly get off the oxygen at least during the day.  Staff will continue to follow with Mr. T throughout the course of this program.  And Counselor will follow on his depressive symptoms to see if consistency in exercise Continues to improve this as it already has.        Psychosocial Re-Evaluation:     Psychosocial Re-Evaluation    [place] Name 11/18/16 1532 12/16/16 1504 12/23/16 1157 01/06/17 1058       Psychosocial Re-Evaluation   Comments Mr Eisen has been stressed. The cold weather is hard on his COPD, and he has recently moved due to his divorce. He is handling the stress and does enjoy coming to [place] for the exercise and socialization.  -- Counselor Follow up with Mr. Rosenberg Rase) today reporting he has seen some progress since coming into this program in that he can walk further without stopping to rest and he is sleeping better. Johnathan also states the socialization components of this program help him feel less isolated.  He admits the weather has impacted his mood and energy levels negatively but he is looking forward to daylight savings time and longer days soon.  Delwin is coping well with his recent separation from his spouse of 20 years by exercising and realizing his life is "less stressful now."  Counselor commended Zaccai for his progress and his commitment to improving  his health. Counselor follow up with Eddie Dibbles today reporting continued progress with his strength and stamina and he continues to sleep well.  He also reports his appetite continues to be healthy currently.  Kathleen continues to struggle with his mood, however, and attributes that to the weather.  His mood has been monitored throughout the course of this program and has decreased with each assessment by this counselor.  Therefore, Counselor encourages Airam to speak with his Dr. about this.  He reports he sees him tomorrow and will discuss this with him.  Valton also stated he was on Vitamin D for 30 days during the month of December, 2017.  And has not had a follow up about that since. Counselor reminded Zackarie that he admits to seasonal affective symptoms and this needs to be addressed since even today it was snowing locally.  Counselor also encouraged Jamon to remind his Dr. about this in the future to see if vitamin D needs to be seasonally prescribed or possibly even light therapy during the winter months.  Rashee agreed to do so.  He enjoys this class and the social components as well.  He continues to handle the stress of the separation from his spouse well and other than his mood; he reports positive progress.      Expected Outcomes  -- Mr Chriswell is attending [facility] regularly and enjoys the socialization with the other participants. He is adjusting  to his new house and has enjoyed the yardwork - creating a holder for his oxygen on his tractor.   Jace will continue to exercise and cope with  his current stress with a better cognitive approach.  He is encouraged to find ways to be less isolated when not coming to exercise class.   Jamauri will discuss his negative mood symptoms with his Dr. tomorrow to determine if he has a vitamin deficiency or may need light therapy prescribed during the winter months in the future.  Joelle will continue to exercise consistently and is commended for his progress made while in this  program.      Continue Psychosocial Services   --  --  -- Follow up required  by staff       Psychosocial Discharge (Final Psychosocial Re-Evaluation):     Psychosocial Re-Evaluation - 01/06/17 1058      Psychosocial Re-Evaluation   Comments Counselor follow up with Eddie Dibbles today reporting continued progress with his strength and stamina and he continues to sleep well.  He also reports his appetite continues to be healthy currently.  Suliman continues to struggle with his mood, however, and attributes that to the weather.  His mood has been monitored throughout the course of this program and has decreased with each assessment by this counselor.  Therefore, Counselor encourages Byan to speak with his Dr. about this.  He reports he sees him tomorrow and will discuss this with him.  Lundy also stated he was on Vitamin D for 30 days during the month of December, 2017.  And has not had a follow up about that since. Counselor reminded Linden that he admits to seasonal affective symptoms and this needs to be addressed since even today it was snowing locally.  Counselor also encouraged Ismail to remind his Dr. about this in the future to see if vitamin D needs to be seasonally prescribed or possibly even light therapy during the winter months.  Marcques agreed to do so.  He enjoys this class and the social components as well.  He continues to handle the stress of the separation from his spouse well and other than his mood; he reports positive progress.     Expected Outcomes Eston will discuss his negative mood symptoms with his Dr. tomorrow to determine if he has a vitamin deficiency or may need light therapy prescribed during the winter months in the future.  Amr will continue to exercise consistently and is commended for his progress made while in this program.     Continue Psychosocial Services  Follow up required by staff      Education: Education Goals: Education classes will be provided on a weekly basis, covering  required topics. Participant will state understanding/return demonstration of topics presented.  Learning Barriers/Preferences:     Learning Barriers/Preferences - 10/13/16 1146      Learning Barriers/Preferences   Learning Barriers None   Learning Preferences None      Education Topics: Initial Evaluation Education: - Verbal, written and demonstration of respiratory meds, RPE/PD scales, oximetry and breathing techniques. Instruction on use of nebulizers and MDIs: cleaning and proper use, rinsing mouth with steroid doses and importance of monitoring MDI activations.   Pulmonary Rehab from 01/08/2017 in [facility] Cardiac and Pulmonary Rehab  Date  10/13/16  Educator  LB  Instruction Review Code  2- meets goals/outcomes      General Nutrition Guidelines/Fats and Fiber: -Group instruction provided by verbal, written material, models and posters to present the general guidelines for heart healthy nutrition. Gives an explanation and review of dietary fats and fiber.   Pulmonary Rehab from 01/08/2017 in [facility] Cardiac and Pulmonary Rehab  Date  12/21/16  Educator  CR  Instruction Review Code  2- meets goals/outcomes      Controlling Sodium/Reading Food Labels: -Group verbal and written material supporting the discussion of sodium use in heart healthy nutrition. Review and explanation with models, verbal and written materials for utilization of the food label.   Pulmonary Rehab from 01/08/2017 in [facility] Cardiac and Pulmonary Rehab  Date  01/04/17  Educator  CR  Instruction Review Code  2- meets goals/outcomes      Exercise Physiology & Risk Factors: - Group verbal and written instruction  with models to review the exercise physiology of the cardiovascular system and associated critical values. Details cardiovascular disease risk factors and the goals associated with each risk factor.   Pulmonary Rehab from 01/08/2017 in [facility] Cardiac and Pulmonary Rehab  Date  10/21/16  Educator  [facility]   Instruction Review Code  2- meets goals/outcomes      Aerobic Exercise & Resistance Training: - Gives group verbal and written discussion on the health impact of inactivity. On the components of aerobic and resistive training programs and the benefits of this training and how to safely progress through these programs.   Pulmonary Rehab from 01/08/2017 in [facility] Cardiac and Pulmonary Rehab  Date  11/18/16  Educator  [facility]  Instruction Review Code  2- meets goals/outcomes      Flexibility, Balance, General Exercise Guidelines: - Provides group verbal and written instruction on the benefits of flexibility and balance training programs. Provides general exercise guidelines with specific guidelines to those with heart or lung disease. Demonstration and skill practice provided.   Pulmonary Rehab from 01/08/2017 in [facility] Cardiac and Pulmonary Rehab  Date  12/11/16  Educator  [place]  Instruction Review Code  2- meets goals/outcomes      Stress Management: - Provides group verbal and written instruction about the health risks of elevated stress, cause of high stress, and healthy ways to reduce stress.   Depression: - Provides group verbal and written instruction on the correlation between heart/lung disease and depressed mood, treatment options, and the stigmas associated with seeking treatment.   Pulmonary Rehab from 01/08/2017 in [facility] Cardiac and Pulmonary Rehab  Date  12/23/16  Educator  [facility]  Instruction Review Code  2- meets goals/outcomes      Exercise & Equipment Safety: - Individual verbal instruction and demonstration of equipment use and safety with use of the equipment.   Infection Prevention: - Provides verbal and written material to individual with discussion of infection control including proper hand washing and proper equipment cleaning during exercise session.   Pulmonary Rehab from 01/08/2017 in [facility] Cardiac and Pulmonary Rehab  Date  10/13/16  Educator  LB  Instruction  Review Code  2- meets goals/outcomes      Falls Prevention: - Provides verbal and written material to individual with discussion of falls prevention and safety.   Pulmonary Rehab from 01/08/2017 in [facility] Cardiac and Pulmonary Rehab  Date  10/13/16  Educator  LB  Instruction Review Code  2- meets goals/outcomes      Diabetes: - Individual verbal and written instruction to review signs/symptoms of diabetes, desired ranges of glucose level fasting, after meals and with exercise. Advice that pre and post exercise glucose checks will be done for 3 sessions at entry of program.   Chronic Lung Diseases: - Group verbal and written instruction to review new updates, new respiratory medications, new advancements in procedures and treatments. Provide informative websites and "800" numbers of self-education.   Pulmonary Rehab from 01/08/2017 in [facility] Cardiac and Pulmonary Rehab  Date  11/11/16  Educator  LB  Instruction Review Code  2- meets goals/outcomes      Lung Procedures: - Group verbal and written instruction to describe testing methods done to diagnose lung disease. Review the outcome of test results. Describe the treatment choices: Pulmonary Function Tests, ABGs and oximetry.   Energy Conservation: - Provide group verbal and written instruction for methods to conserve energy, plan and organize activities. Instruct on pacing techniques, use of adaptive equipment and posture/positioning to relieve shortness  of breath.   Pulmonary Rehab from 01/08/2017 in [facility] Cardiac and Pulmonary Rehab  Date  12/16/16  Educator  [facility]  Instruction Review Code  2- meets goals/outcomes      Triggers: - Group verbal and written instruction to review types of environmental controls: home humidity, furnaces, filters, dust mite/pet prevention, HEPA vacuums. To discuss weather changes, air quality and the benefits of nasal washing.   Pulmonary Rehab from 01/08/2017 in [facility] Cardiac and Pulmonary Rehab  Date   12/09/16  Educator  LB  Instruction Review Code  2- meets goals/outcomes      Exacerbations: - Group verbal and written instruction to provide: warning signs, infection symptoms, calling MD promptly, preventive modes, and value of vaccinations. Review: effective airway clearance, coughing and/or vibration techniques. Create an Sports administrator.   Pulmonary Rehab from 01/08/2017 in [facility] Cardiac and Pulmonary Rehab  Date  12/02/16  Educator  LB  Instruction Review Code  2- meets goals/outcomes      Oxygen: - Individual and group verbal and written instruction on oxygen therapy. Includes supplement oxygen, available portable oxygen systems, continuous and intermittent flow rates, oxygen safety, concentrators, and Medicare reimbursement for oxygen.   Pulmonary Rehab from 01/08/2017 in [facility] Cardiac and Pulmonary Rehab  Date  10/13/16  Educator  LB  Instruction Review Code  2- meets goals/outcomes      Respiratory Medications: - Group verbal and written instruction to review medications for lung disease. Drug class, frequency, complications, importance of spacers, rinsing mouth after steroid MDI's, and proper cleaning methods for nebulizers.   Pulmonary Rehab from 01/08/2017 in [facility] Cardiac and Pulmonary Rehab  Date  10/13/16  Educator  LB  Instruction Review Code  2- meets goals/outcomes      AED/CPR: - Group verbal and written instruction with the use of models to demonstrate the basic use of the AED with the basic ABC's of resuscitation.   Breathing Retraining: - Provides individuals verbal and written instruction on purpose, frequency, and proper technique of diaphragmatic breathing and pursed-lipped breathing. Applies individual practice skills.   Pulmonary Rehab from 01/08/2017 in [facility] Cardiac and Pulmonary Rehab  Date  10/13/16  Educator  LB  Instruction Review Code  2- meets goals/outcomes      Anatomy and Physiology of the Lungs: - Group verbal and written instruction with  the use of models to provide basic lung anatomy and physiology related to function, structure and complications of lung disease.   Pulmonary Rehab from 01/08/2017 in [facility] Cardiac and Pulmonary Rehab  Date  01/08/17  Educator  LB  Instruction Review Code  2- meets goals/outcomes      Heart Failure: - Group verbal and written instruction on the basics of heart failure: signs/symptoms, treatments, explanation of ejection fraction, enlarged heart and cardiomyopathy.   Sleep Apnea: - Individual verbal and written instruction to review Obstructive Sleep Apnea. Review of risk factors, methods for diagnosing and types of masks and machines for OSA.   Anxiety: - Provides group, verbal and written instruction on the correlation between heart/lung disease and anxiety, treatment options, and management of anxiety.   Pulmonary Rehab from 01/08/2017 in [facility] Cardiac and Pulmonary Rehab  Date  10/28/16  Educator  [facility]  Instruction Review Code  2- Meets goals/outcomes      Relaxation: - Provides group, verbal and written instruction about the benefits of relaxation for patients with heart/lung disease. Also provides patients with examples of relaxation techniques.   Pulmonary Rehab from 01/08/2017 in [facility] Cardiac and  Pulmonary Rehab  Date  11/25/16  Educator  [facility]  Instruction Review Code  2- Meets goals/outcomes      Knowledge Questionnaire Score:     Knowledge Questionnaire Score - 10/13/16 1146      Knowledge Questionnaire Score   Pre Score 7/10       Core Components/Risk Factors/Patient Goals at Admission:     Personal Goals and Risk Factors at Admission - 10/13/16 1150      Core Components/Risk Factors/Patient Goals on Admission    Weight Management Yes;Weight Loss   Intervention Weight Management: Develop a combined nutrition and exercise program designed to reach desired caloric intake, while maintaining appropriate intake of nutrient and fiber, sodium and fats, and appropriate  energy expenditure required for the weight goal.;Weight Management: Provide education and appropriate resources to help participant work on and attain dietary goals.   Admit Weight 177 lb (80.3 kg)   Goal Weight: Short Term 172 lb (78 kg)   Goal Weight: Long Term 167 lb (75.8 kg)   Expected Outcomes Short Term: Continue to assess and modify interventions until short term weight is achieved;Long Term: Adherence to nutrition and physical activity/exercise program aimed toward attainment of established weight goal;Weight Loss: Understanding of general recommendations for a balanced deficit meal plan, which promotes 1-2 lb weight loss per week and includes a negative energy balance of [id number] kcal/d;Understanding recommendations for meals to include 15-35% energy as protein, 25-35% energy from fat, 35-60% energy from carbohydrates, less than 251m of dietary cholesterol, 20-35 gm of total fiber daily;Understanding of distribution of calorie intake throughout the day with the consumption of 4-5 meals/snacks   Sedentary Yes   Intervention Provide advice, education, support and counseling about physical activity/exercise needs.;Develop an individualized exercise prescription for aerobic and resistive training based on initial evaluation findings, risk stratification, comorbidities and participant's personal goals.   Expected Outcomes Achievement of increased cardiorespiratory fitness and enhanced flexibility, muscular endurance and strength shown through measurements of functional capacity and personal statement of participant.   Increase Strength and Stamina Yes   Intervention Provide advice, education, support and counseling about physical activity/exercise needs.;Develop an individualized exercise prescription for aerobic and resistive training based on initial evaluation findings, risk stratification, comorbidities and participant's personal goals.   Expected Outcomes Achievement of increased  cardiorespiratory fitness and enhanced flexibility, muscular endurance and strength shown through measurements of functional capacity and personal statement of participant.   Improve shortness of breath with ADL's Yes   Intervention Provide education, individualized exercise plan and daily activity instruction to help decrease symptoms of SOB with activities of daily living.   Expected Outcomes Short Term: Achieves a reduction of symptoms when performing activities of daily living.   Develop more efficient breathing techniques such as purse lipped breathing and diaphragmatic breathing; and practicing self-pacing with activity Yes   Intervention Provide education, demonstration and support about specific breathing techniuqes utilized for more efficient breathing. Include techniques such as pursed lipped breathing, diaphragmatic breathing and self-pacing activity.   Expected Outcomes Short Term: Participant will be able to demonstrate and use breathing techniques as needed throughout daily activities.   Increase knowledge of respiratory medications and ability to use respiratory devices properly  Yes  ProAir MDI; oxygen 2-4l/m   Intervention Provide education and demonstration as needed of appropriate use of medications, inhalers, and oxygen therapy.   Expected Outcomes Short Term: Achieves understanding of medications use. Understands that oxygen is a medication prescribed by physician. Demonstrates appropriate use of inhaler and oxygen therapy.  Hypertension Yes   Intervention Provide education on lifestyle modifcations including regular physical activity/exercise, weight management, moderate sodium restriction and increased consumption of fresh fruit, vegetables, and low fat dairy, alcohol moderation, and smoking cessation.;Monitor prescription use compliance.   Expected Outcomes Short Term: Continued assessment and intervention until BP is < 140/37m HG in hypertensive participants. < 130/859mHG in  hypertensive participants with diabetes, heart failure or chronic kidney disease.;Long Term: Maintenance of blood pressure at goal levels.   Lipids Yes   Intervention Provide education and support for participant on nutrition & aerobic/resistive exercise along with prescribed medications to achieve LDL <7059mHDL >8m33m Expected Outcomes Short Term: Participant states understanding of desired cholesterol values and is compliant with medications prescribed. Participant is following exercise prescription and nutrition guidelines.;Long Term: Cholesterol controlled with medications as prescribed, with individualized exercise RX and with personalized nutrition plan. Value goals: LDL < 70mg46mL > 40 mg.      Core Components/Risk Factors/Patient Goals Review:      Goals and Risk Factor Review    Row Name 10/21/16 1357 10/26/16 1113 11/18/16 1516 12/07/16 1116 01/06/17 1356     Core Components/Risk Factors/Patient Goals Review   Personal Goals Review Develop more efficient breathing techniques such as purse lipped breathing and diaphragmatic breathing and practicing self-pacing with activity. Develop more efficient breathing techniques such as purse lipped breathing and diaphragmatic breathing and practicing self-pacing with activity.;Increase Strength and Stamina Sedentary;Increase Strength and Stamina;Improve shortness of breath with ADL's;Develop more efficient breathing techniques such as purse lipped breathing and diaphragmatic breathing and practicing self-pacing with activity.;Increase knowledge of respiratory medications and ability to use respiratory devices properly.;Hypertension;Lipids Increase Strength and Stamina;Develop more efficient breathing techniques such as purse lipped breathing and diaphragmatic breathing and practicing self-pacing with activity.;Lipids;Hypertension;Weight Management/Obesity;Improve shortness of breath with ADL's Weight Management/Obesity;Improve shortness of breath  with ADL's;Increase knowledge of respiratory medications and ability to use respiratory devices properly.;Develop more efficient breathing techniques such as purse lipped breathing and diaphragmatic breathing and practicing self-pacing with activity.;Hypertension;Lipids   Review Reviewed pursed lip breathing technique with Rusty today. Patient tolerated his first session well and stated that it didn't bother him the next day. He has been using PLB and stated that it is helping him a little.  Mr TilliWainrightprogressed with his exercise goals. He has maintained acceptable O2Sat's with his exercise on 2l/m. Mr Girton does self-adjust his oxygen liter flow and is very content with the gas E tank for portability. He knows the advantage to continuous flow.  Using PLB and pacing , he is active at his new house and has made a carrier for his O2 tank for his tractor. Today I gave him a spacer for his ProAir with instructions. His BP is acceptable, and he is complaint with his lipid medication.  Patient stated that he has maintained his weight and his energy levels and stamina are about the same. Stated that weather is his major limiting factor with breathing and SOB, but feels comfortable using PLB to mainage SOB. Blood pressure and lipid panels were reported as being good.   Mr TilliHoraompliant with his medications, including his MDI's. He has no problems with his lipid level,  and BP have been acceptable in [facility]. His shortness of breath has not improved, but he manages it with PLB and pacing. Mr TilliDeardenes that the exercise has increased his energy level and plans to continue exercise in our [employer].   Expected  Outcomes Rusty will become more profieicent at using PLB and will eventually become independent in using PLB without queuing. Stormy Card will attend class regularly and continue to gain increases in strength and stamina. He will continue to use PLB to help control SOB.   Continue progressing with his exercise and knowledge of self-management of his COPD. Patient will continue to attend [facility] regularly and progress towards these goals.  Continue progressing with his exercise goals and planning exercise after [facility].      Core Components/Risk Factors/Patient Goals at Discharge (Final Review):      Goals and Risk Factor Review - 01/06/17 1356      Core Components/Risk Factors/Patient Goals Review   Personal Goals Review Weight Management/Obesity;Improve shortness of breath with ADL's;Increase knowledge of respiratory medications and ability to use respiratory devices properly.;Develop more efficient breathing techniques such as purse lipped breathing and diaphragmatic breathing and practicing self-pacing with activity.;Hypertension;Lipids   Review Mr Lariccia is compliant with his medications, including his MDI's. He has no problems with his lipid level,  and BP have been acceptable in [facility]. His shortness of breath has not improved, but he manages it with PLB and pacing. Mr Havey states that the exercise has increased his energy level and plans to continue exercise in our [employer].   Expected Outcomes Continue progressing with his exercise goals and planning exercise after [facility].      ITP Comments:   Comments: 30 day note review

## 2017-01-28 ENCOUNTER — Telehealth: Payer: Self-pay

## 2017-01-28 NOTE — Telephone Encounter (Signed)
Austin Patterson has been out sick and plans to return Monday.

## 2017-01-29 ENCOUNTER — Encounter: Payer: Self-pay | Admitting: Emergency Medicine

## 2017-01-29 ENCOUNTER — Emergency Department: Payer: Medicaid Other

## 2017-01-29 ENCOUNTER — Emergency Department
Admission: EM | Admit: 2017-01-29 | Discharge: 2017-01-29 | Disposition: A | Discharge status: Home / Self Care | Payer: Medicaid Other | Attending: Emergency Medicine | Admitting: Emergency Medicine

## 2017-01-29 DIAGNOSIS — Z87891 Personal history of nicotine dependence: Secondary | ICD-10-CM | POA: Diagnosis not present

## 2017-01-29 DIAGNOSIS — J8410 Pulmonary fibrosis, unspecified: Secondary | ICD-10-CM | POA: Diagnosis not present

## 2017-01-29 DIAGNOSIS — J441 Chronic obstructive pulmonary disease with (acute) exacerbation: Secondary | ICD-10-CM | POA: Insufficient documentation

## 2017-01-29 DIAGNOSIS — I10 Essential (primary) hypertension: Secondary | ICD-10-CM | POA: Insufficient documentation

## 2017-01-29 DIAGNOSIS — R0602 Shortness of breath: Secondary | ICD-10-CM | POA: Diagnosis present

## 2017-01-29 DIAGNOSIS — Z79899 Other long term (current) drug therapy: Secondary | ICD-10-CM | POA: Insufficient documentation

## 2017-01-29 LAB — CBC WITH DIFFERENTIAL/PLATELET
BASOS ABS: 0 10*3/uL (ref 0–0.1)
BASOS PCT: 0 %
EOS ABS: 0 10*3/uL (ref 0–0.7)
Eosinophils Relative: 0 %
HEMATOCRIT: 38 % — AB (ref 40.0–52.0)
HEMOGLOBIN: 13.3 g/dL (ref 13.0–18.0)
Lymphocytes Relative: 14 %
Lymphs Abs: 1.1 10*3/uL (ref 1.0–3.6)
MCH: 35.9 pg — ABNORMAL HIGH (ref 26.0–34.0)
MCHC: 35 g/dL (ref 32.0–36.0)
MCV: 102.6 fL — ABNORMAL HIGH (ref 80.0–100.0)
Monocytes Absolute: 0.8 10*3/uL (ref 0.2–1.0)
Monocytes Relative: 11 %
NEUTROS PCT: 75 %
Neutro Abs: 5.8 10*3/uL (ref 1.4–6.5)
Platelets: 310 10*3/uL (ref 150–440)
RBC: 3.71 MIL/uL — AB (ref 4.40–5.90)
RDW: 13.7 % (ref 11.5–14.5)
WBC: 7.8 10*3/uL (ref 3.8–10.6)

## 2017-01-29 LAB — COMPREHENSIVE METABOLIC PANEL
ALBUMIN: 4.9 g/dL (ref 3.5–5.0)
ALK PHOS: 42 U/L (ref 38–126)
ALT: 21 U/L (ref 17–63)
ANION GAP: 11 (ref 5–15)
AST: 37 U/L (ref 15–41)
BILIRUBIN TOTAL: 1.1 mg/dL (ref 0.3–1.2)
BUN: 20 mg/dL (ref 6–20)
CALCIUM: 9.2 mg/dL (ref 8.9–10.3)
CO2: 24 mmol/L (ref 22–32)
Chloride: 98 mmol/L — ABNORMAL LOW (ref 101–111)
Creatinine, Ser: 0.93 mg/dL (ref 0.61–1.24)
GFR calc Af Amer: >60 mL/min (ref >60)
GFR calc non Af Amer: >60 mL/min (ref >60)
GLUCOSE: 120 mg/dL — AB (ref 65–99)
Potassium: 4.2 mmol/L (ref 3.5–5.1)
SODIUM: 133 mmol/L — AB (ref 135–145)
TOTAL PROTEIN: 7.6 g/dL (ref 6.5–8.1)

## 2017-01-29 LAB — TROPONIN I

## 2017-01-29 LAB — BRAIN NATRIURETIC PEPTIDE: B NATRIURETIC PEPTIDE 5: 15 pg/mL (ref 0.0–100.0)

## 2017-01-29 MED ORDER — ALBUTEROL SULFATE HFA 108 (90 BASE) MCG/ACT IN AERS: 2.0000 | INHALATION_SPRAY | Freq: Four times a day (QID) | RESPIRATORY_TRACT | 2 refills | Status: DC | PRN

## 2017-01-29 MED ORDER — AZITHROMYCIN 500 MG PO TABS
500.0000 mg | ORAL_TABLET | Freq: Once | ORAL | Status: AC
  Administered 2017-01-29: 500 mg via ORAL
  Filled 2017-01-29: qty 1

## 2017-01-29 MED ORDER — IPRATROPIUM-ALBUTEROL 0.5-2.5 (3) MG/3ML IN SOLN
3.0000 mL | Freq: Once | RESPIRATORY_TRACT | Status: AC
  Administered 2017-01-29: 3 mL via RESPIRATORY_TRACT
  Filled 2017-01-29: qty 3

## 2017-01-29 MED ORDER — SODIUM CHLORIDE 0.9 % IV BOLUS (SEPSIS)
1000.0000 mL | Freq: Once | INTRAVENOUS | Status: AC
  Administered 2017-01-29: 1000 mL via INTRAVENOUS

## 2017-01-29 MED ORDER — PREDNISONE 10 MG PO TABS: 50.0000 mg | ORAL_TABLET | Freq: Every day | ORAL | 0 refills | Status: AC

## 2017-01-29 MED ORDER — ACETAMINOPHEN 500 MG PO TABS
1000.0000 mg | ORAL_TABLET | Freq: Once | ORAL | Status: AC
Start: 2017-01-29 — End: 2017-01-29
  Administered 2017-01-29: 1000 mg via ORAL
  Filled 2017-01-29: qty 2

## 2017-01-29 MED ORDER — AZITHROMYCIN 250 MG PO TABS
ORAL_TABLET | ORAL | 0 refills | Status: AC
Start: 2017-01-29 — End: 2017-02-03

## 2017-01-29 MED ORDER — SPACER/AERO CHAMBER MOUTHPIECE MISC: 1.0000 | 0 refills | Status: DC | PRN

## 2017-01-29 MED ORDER — IBUPROFEN 600 MG PO TABS
600.0000 mg | ORAL_TABLET | Freq: Once | ORAL | Status: AC
  Administered 2017-01-29: 600 mg via ORAL
  Filled 2017-01-29: qty 1

## 2017-01-29 MED ORDER — METHYLPREDNISOLONE SODIUM SUCC 125 MG IJ SOLR
125.0000 mg | Freq: Once | INTRAMUSCULAR | Status: AC
  Administered 2017-01-29: 125 mg via INTRAVENOUS
  Filled 2017-01-29: qty 2

## 2017-01-29 NOTE — ED Provider Notes (Signed)
[facility] Emergency Department Provider Note  ____________________________________________   First MD Initiated Contact with Patient 01/29/17 1746     (approximate)  I have reviewed the triage vital signs and the nursing notes.   HISTORY  Chief Complaint Shortness of Breath    HPI Austin Patterson is a 61 y.o. male who comes to the emergency department with several days of shortness of breath. 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 He coughs daily but is noted no increase in his cough and no sputum production. He does have some chest pain that is sharp and aching nonexertional. Nothing in particular makes it better or worse.   Past Medical History:  Diagnosis Date  . COPD (chronic obstructive pulmonary disease) ([place])   . Hyperlipidemia   . Hypertension   . Pulmonary fibrosis ([place]) 11/2015    Patient Active Problem List   Diagnosis Date Noted  . Hiatal hernia   . Reflux esophagitis   . Hematemesis without nausea   . Hyponatremia 09/21/2016  . Pneumonia 12/09/2015  . COPD with acute exacerbation ([place]) 10/14/2015    Past Surgical History:  Procedure Laterality Date  . CORONARY STENT PLACEMENT    . ESOPHAGOGASTRODUODENOSCOPY (EGD) WITH PROPOFOL N/A 09/23/2016   Procedure: ESOPHAGOGASTRODUODENOSCOPY (EGD) WITH PROPOFOL;  Surgeon: [name], MD;  Location: [facility] ENDOSCOPY;  Service: Endoscopy;  Laterality: N/A;  . SHOULDER ACROMIOPLASTY      Prior to Admission medications   Medication Sig Start Date End Date Taking? Authorizing Provider  albuterol (PROVENTIL HFA;VENTOLIN HFA) 108 (90 Base) MCG/ACT inhaler Inhale 2 puffs into the lungs every 6 (six) hours as needed for wheezing or shortness of breath. 01/29/17   Darel Hong, MD  amLODipine (NORVASC) 10 MG tablet Take 1 tablet (10 mg total) by mouth daily. 08/19/16   Tawni Millers, MD  aspirin EC 81 MG tablet Take 1  tablet (81 mg total) by mouth daily. 08/19/16   Tawni Millers, MD  atorvastatin (LIPITOR) 40 MG tablet Take 1 tablet (40 mg total) by mouth at bedtime. 08/19/16   Tawni Millers, MD  azithromycin (ZITHROMAX Z-PAK) 250 MG tablet Take 2 tablets (500 mg) on  Day 1,  followed by 1 tablet (250 mg) once daily on Days 2 through 5. 01/29/17 02/03/17  Darel Hong, MD  calcium-vitamin D (OSCAL WITH D) 500-200 MG-UNIT tablet Take 2 tablets by mouth daily with breakfast. 09/24/16   Fritzi Mandes, MD  cetirizine (ZYRTEC) 10 MG tablet Take 10 mg by mouth at bedtime. 08/19/16   Historical Provider, MD  clopidogrel (PLAVIX) 75 MG tablet Take 1 tablet (75 mg total) by mouth daily. 08/19/16   Tawni Millers, MD  losartan-hydrochlorothiazide (HYZAAR) 100-12.5 MG tablet Take 1 tablet by mouth daily. 08/19/16   Tawni Millers, MD  metoprolol succinate (TOPROL-XL) 50 MG 24 hr tablet Take 1 tablet (50 mg total) by mouth daily. 08/19/16   Tawni Millers, MD  Multiple Vitamin (MULTIVITAMIN WITH MINERALS) TABS tablet Take 1 tablet by mouth daily. 09/24/16   Fritzi Mandes, MD  pantoprazole (PROTONIX) 40 MG tablet Take 1 tablet (40 mg total) by mouth 2 (two) times daily. 09/23/16   Fritzi Mandes, MD  predniSONE (DELTASONE) 10 MG tablet Take 5 tablets (50 mg total) by mouth daily. 01/29/17 02/03/17  Darel Hong, MD  Spacer/Aero Chamber Mouthpiece MISC 1 each by Does not apply route every 4 (  four) hours as needed (wheezing). 01/29/17   Darel Hong, MD  umeclidinium-vilanterol [facility] ELLIPTA) 62.5-25 MCG/INH AEPB INHALE ONE PUFF INTO THE LUNGS DAILY 01/07/17   Flora Lipps, MD    Allergies Patient has no known allergies.  Family History  Problem Relation Age of Onset  . Heart disease Mother     Social History Social History  Substance Use Topics  . Smoking status: Former Smoker    Quit date: 10/14/2015  . Smokeless tobacco: Former Systems developer  . Alcohol use 16.8 oz/week    28 Cans of beer per week     Comment: 6 12 oz cans of beer/day     Review of Systems Constitutional: No fever/chills Eyes: No visual changes. ENT: No sore throat. Cardiovascular: Positive chest pain. Respiratory: Positive shortness of breath. Gastrointestinal: No abdominal pain.  No nausea, no vomiting.  No diarrhea.  No constipation. Genitourinary: Negative for dysuria. Musculoskeletal: Negative for back pain. Skin: Negative for rash. Neurological: Negative for headaches, focal weakness or numbness.  10-point ROS otherwise negative.  ____________________________________________   PHYSICAL EXAM:  VITAL SIGNS: ED Triage Vitals  Enc Vitals Group     BP 01/29/17 1455 124/82     Pulse Rate 01/29/17 1453 (!) 123     Resp 01/29/17 1453 (!) 22     Temp 01/29/17 1453 98.2 F (36.8 C)     Temp Source 01/29/17 1453 Oral     SpO2 01/29/17 1453 95 %     Weight 01/29/17 1456 185 lb (83.9 kg)     Height 01/29/17 1456 5\' 7"  (1.702 m)     Head Circumference --      Peak Flow --      Pain Score 01/29/17 1504 6     Pain Loc --      Pain Edu? --      Excl. in [place]? --     Constitutional: Alert and oriented x 4 well appearing nontoxic no diaphoresis speaks in full, clear sentences Eyes: PERRL EOMI. Head: Atraumatic. Nose: No congestion/rhinnorhea. Mouth/Throat: No trismus Neck: No stridor.   Cardiovascular: Normal rate, regular rhythm. Grossly normal heart sounds.  Good peripheral circulation. Respiratory: Normal respiratory effort.  No retractions. Lungs CTAB But moving somewhat limited amounts of air Gastrointestinal: Soft nondistended nontender no rebound no guarding no peritonitis no McBurney's tenderness negative Rovsing's no costovertebral tenderness negative Murphy's Musculoskeletal: No lower extremity edema   Neurologic:  Normal speech and language. No gross focal neurologic deficits are appreciated. Skin:  Skin is warm, dry and intact. No rash noted. Psychiatric: Mood and affect are normal. Speech and behavior are  normal.    ____________________________________________   DIFFERENTIAL  COPD exacerbation, pneumothorax, acute coronary syndrome, pulmonary embolism ____________________________________________   LABS (all labs ordered are listed, but only abnormal results are displayed)  Labs Reviewed  CBC WITH DIFFERENTIAL/PLATELET - Abnormal; Notable for the following:       Result Value   RBC 3.71 (*)    HCT 38.0 (*)    MCV 102.6 (*)    MCH 35.9 (*)    All other components within normal limits  COMPREHENSIVE METABOLIC PANEL - Abnormal; Notable for the following:    Sodium 133 (*)    Chloride 98 (*)    Glucose, Bld 120 (*)    All other components within normal limits  BLOOD GAS, VENOUS  TROPONIN I  BRAIN NATRIURETIC PEPTIDE    Labs largely unremarkable __________________________________________  EKG  ED ECG REPORT I, Darel Hong, the attending  physician, personally viewed and interpreted this ECG.  Date: 01/29/2017 Rate: 109 Rhythm: Sinus tachycardia QRS Axis: normal Intervals: normal ST/T Wave abnormalities: normal Conduction Disturbances: none Narrative Interpretation: Borderline  ____________________________________________  RADIOLOGY  Chest x-ray with chronic findings with no acute disease ____________________________________________   PROCEDURES  Procedure(s) performed: no  Procedures  Critical Care performed: no  ____________________________________________   INITIAL IMPRESSION / ASSESSMENT AND PLAN / ED COURSE  Pertinent labs & imaging results that were available during my care of the patient were reviewed by me and considered in my medical decision making (see chart for details).  On arrival the patient is well-appearing and does not appear clinically short of breath and is saturating well on 2 L of nasal cannula. His lungs while and not wheezy they are somewhat tight swell give him several nebulizations and reevaluate.      ----------------------------------------- 7:33 PM on 01/29/2017 -----------------------------------------  After receiving his nebulizations the patient feels a little bit better and his lungs are now moving more air and he is somewhat noisy consistent with improvement. He still saturating 99%. He is somewhat tremulous after the albuterol so I'll watch him here for a while but she should be stable for discharge with a short course of azithromycin and prednisone. ____________________________________________   FINAL CLINICAL IMPRESSION(S) / ED DIAGNOSES  Final diagnoses:  Pulmonary fibrosis ([place])      NEW MEDICATIONS STARTED DURING THIS VISIT:  New Prescriptions   ALBUTEROL (PROVENTIL HFA;VENTOLIN HFA) 108 (90 BASE) MCG/ACT INHALER    Inhale 2 puffs into the lungs every 6 (six) hours as needed for wheezing or shortness of breath.   AZITHROMYCIN (ZITHROMAX Z-PAK) 250 MG TABLET    Take 2 tablets (500 mg) on  Day 1,  followed by 1 tablet (250 mg) once daily on Days 2 through 5.   PREDNISONE (DELTASONE) 10 MG TABLET    Take 5 tablets (50 mg total) by mouth daily.   SPACER/AERO CHAMBER MOUTHPIECE MISC    1 each by Does not apply route every 4 (four) hours as needed (wheezing).     Note:  This document was prepared using Dragon voice recognition software and may include unintentional dictation errors.     Darel Hong, MD 01/29/17 2016

## 2017-01-29 NOTE — ED Triage Notes (Signed)
Pt to ed with c/o increasing sob over the last week.  Pt is on o2 at 3lpm via mc all the time at home.  Pt also c/o left sided chest pain.

## 2017-01-29 NOTE — ED Notes (Signed)
EDP at bedside, pt states intermittent chest pain, denies any productive cough, pt on 3L [place] at home, awake and alert in no acute distress

## 2017-01-29 NOTE — ED Notes (Signed)
Pt. Helped up to use bathroom.

## 2017-01-29 NOTE — Discharge Instructions (Signed)
Please take all of your medications as prescribed and follow-up with your primary care physician on Monday for recheck. Return to the emergency department sooner for any new or worsening symptoms such as if you develop worsening shortness of breath, if he developed fevers or chills, or for any other concerns.  It was a pleasure to take care of you today, and thank you for coming to our emergency department.  If you have any questions or concerns before leaving please ask the nurse to grab me and I'm more than happy to go through your aftercare instructions again.  If you were prescribed any opioid pain medication today such as Norco, Vicodin, Percocet, morphine, hydrocodone, or oxycodone please make sure you do not drive when you are taking this medication as it can alter your ability to drive safely.  If you have any concerns once you are home that you are not improving or are in fact getting worse before you can make it to your follow-up appointment, please do not hesitate to call 911 and come back for further evaluation.  Darel Hong MD  Results for orders placed or performed during the hospital encounter of 01/29/17  CBC with Differential  Result Value Ref Range   WBC 7.8 3.8 - 10.6 K/uL   RBC 3.71 (L) 4.40 - 5.90 MIL/uL   Hemoglobin 13.3 13.0 - 18.0 g/dL   HCT 38.0 (L) 40.0 - 52.0 %   MCV 102.6 (H) 80.0 - 100.0 fL   MCH 35.9 (H) 26.0 - 34.0 pg   MCHC 35.0 32.0 - 36.0 g/dL   RDW 13.7 11.5 - 14.5 %   Platelets 310 150 - 440 K/uL   Neutrophils Relative % 75 %   Neutro Abs 5.8 1.4 - 6.5 K/uL   Lymphocytes Relative 14 %   Lymphs Abs 1.1 1.0 - 3.6 K/uL   Monocytes Relative 11 %   Monocytes Absolute 0.8 0.2 - 1.0 K/uL   Eosinophils Relative 0 %   Eosinophils Absolute 0.0 0 - 0.7 K/uL   Basophils Relative 0 %   Basophils Absolute 0.0 0 - 0.1 K/uL  Comprehensive metabolic panel  Result Value Ref Range   Sodium 133 (L) 135 - 145 mmol/L   Potassium 4.2 3.5 - 5.1 mmol/L   Chloride 98  (L) 101 - 111 mmol/L   CO2 24 22 - 32 mmol/L   Glucose, Bld 120 (H) 65 - 99 mg/dL   BUN 20 6 - 20 mg/dL   Creatinine, Ser 0.93 0.61 - 1.24 mg/dL   Calcium 9.2 8.9 - 10.3 mg/dL   Total Protein 7.6 6.5 - 8.1 g/dL   Albumin 4.9 3.5 - 5.0 g/dL   AST 37 15 - 41 U/L   ALT 21 17 - 63 U/L   Alkaline Phosphatase 42 38 - 126 U/L   Total Bilirubin 1.1 0.3 - 1.2 mg/dL   GFR calc non Af Amer >60 >60 mL/min   GFR calc Af Amer >60 >60 mL/min   Anion gap 11 5 - 15  Blood gas, venous  Result Value Ref Range   FIO2 PENDING    pH, Ven 7.38 7.250 - 7.430   pCO2, Ven 44 44.0 - 60.0 mmHg   pO2, Ven PENDING 32.0 - 45.0 mmHg   Bicarbonate 26.0 20.0 - 28.0 mmol/L   Acid-Base Excess 0.5 0.0 - 2.0 mmol/L   O2 Saturation PENDING %   Patient temperature 37.0    Collection site PENDING    Sample type PENDING  Mechanical Rate PENDING   Troponin I  Result Value Ref Range   Troponin I <0.03 <0.03 ng/mL  Brain natriuretic peptide  Result Value Ref Range   B Natriuretic Peptide 15.0 0.0 - 100.0 pg/mL   Dg Chest 2 View  Result Date: 01/29/2017 CLINICAL DATA:  61 year old male with tachycardia, shortness of breath, malaise for 1 week, central chest pain. EXAM: CHEST  2 VIEW COMPARISON:  Chest CT and radiographs 01/29/2016 and earlier. FINDINGS: Chronic emphysema with architectural distortion about the right hilum and lung base. Stable lung volumes. No pneumothorax, pulmonary edema, pleural effusion or confluent pulmonary opacity. Mediastinal contours remain normal. Visualized tracheal air column is within normal limits. Calcified aortic atherosclerosis. No acute osseous abnormality identified. Negative visible bowel gas pattern. IMPRESSION: 1. Chronic lung disease with emphysema and right lung architectural distortion. No acute cardiopulmonary abnormality. 2.  Calcified aortic atherosclerosis. Electronically Signed   By: Genevie Ann M.D.   On: 01/29/2017 15:26

## 2017-01-29 NOTE — ED Notes (Signed)
Pt. Going home by self. 

## 2017-02-01 ENCOUNTER — Encounter: Payer: Self-pay | Admitting: Respiratory Therapy

## 2017-02-01 ENCOUNTER — Telehealth: Payer: Self-pay | Admitting: Respiratory Therapy

## 2017-02-01 DIAGNOSIS — J449 Chronic obstructive pulmonary disease, unspecified: Secondary | ICD-10-CM

## 2017-02-01 NOTE — Telephone Encounter (Signed)
Austin Patterson called and has been out from [name] due to illness. He has been treated with prednisone and an antibiotic and hopes to return to [place] soon.

## 2017-02-03 LAB — BLOOD GAS, VENOUS
ACID-BASE EXCESS: 0.5 mmol/L (ref 0.0–2.0)
BICARBONATE: 26 mmol/L (ref 20.0–28.0)
PCO2 VEN: 44 mmHg (ref 44.0–60.0)
PH VEN: 7.38 (ref 7.250–7.430)
Patient temperature: 37

## 2017-02-08 ENCOUNTER — Encounter: Payer: Medicaid Other | Admitting: *Deleted

## 2017-02-08 DIAGNOSIS — J449 Chronic obstructive pulmonary disease, unspecified: Secondary | ICD-10-CM | POA: Diagnosis present

## 2017-02-08 NOTE — Progress Notes (Signed)
Daily Session Note  Patient Details  Name: Austin Patterson MRN: [id number] Date of Birth: 07-16-1956 Referring Provider:     Pulmonary Rehab from 10/13/2016 in [facility] Cardiac and Pulmonary Rehab  Referring Provider  Laverle Hobby MD      Encounter Date: 02/08/2017  Check In:     Session Check In - 02/08/17 1017      Check-In   Location [facility]-Cardiac & Pulmonary Rehab   Staff Present Nada Maclachlan, BA, ACSM CEP, Exercise Physiologist;Laureen Owens Shark, BS, RRT, Respiratory Bertis Ruddy, BS, ACSM CEP, Exercise Physiologist   Supervising physician immediately available to respond to emergencies [facility] immediately available ER MD   Physician(s) Mariea Clonts and Alfred Levins   Medication changes reported     No   Fall or balance concerns reported    No   Warm-up and Cool-down Performed as group-led Location manager Performed Yes   VAD Patient? No     Pain Assessment   Currently in Pain? No/denies   Multiple Pain Sites No         History  Smoking Status  . Former Smoker  . Quit date: 10/14/2015  Smokeless Tobacco  . Former Systems developer    Goals Met:  Proper associated with RPD/PD & O2 Sat Independence with exercise equipment Exercise tolerated well Strength training completed today  Goals Unmet:  Not Applicable  Comments: Pt able to follow exercise prescription today without complaint.  Will continue to monitor for progression.    Dr. Emily Filbert is Medical Director for [place] and [facility].

## 2017-02-10 DIAGNOSIS — J449 Chronic obstructive pulmonary disease, unspecified: Secondary | ICD-10-CM | POA: Diagnosis not present

## 2017-02-10 NOTE — Progress Notes (Signed)
Daily Session Note  Patient Details  Name: Austin Patterson MRN: [id number] Date of Birth: 1956-02-06 Referring Provider:     Pulmonary Rehab from 10/13/2016 in [facility] Cardiac and Pulmonary Rehab  Referring Provider  Laverle Hobby MD      Encounter Date: 02/10/2017  Check In:     Session Check In - 02/10/17 1254      Check-In   Location [facility]-Cardiac & Pulmonary Rehab   Staff Present Carson Myrtle, BS, RRT, Respiratory Lennie Hummer, MA, ACSM RCEP, Exercise Physiologist;Sharol Croghan Oletta Darter, BA, ACSM CEP, Exercise Physiologist   Supervising physician immediately available to respond to emergencies [facility] immediately available ER MD   Physician(s) Joni Fears and Jimmye Norman   Medication changes reported     No   Fall or balance concerns reported    No   Warm-up and Cool-down Performed as group-led Location manager Performed Yes   VAD Patient? No     Pain Assessment   Currently in Pain? No/denies           Exercise Prescription Changes - 02/10/17 1200      Response to Exercise   Blood Pressure (Admit) 122/70   Blood Pressure (Exercise) 164/86   Blood Pressure (Exit) 134/78   Heart Rate (Admit) 107 bpm   Heart Rate (Exercise) 113 bpm   Heart Rate (Exit) 91 bpm   Oxygen Saturation (Admit) 92 %   Oxygen Saturation (Exercise) 92 %   Oxygen Saturation (Exit) 91 %   Rating of Perceived Exertion (Exercise) 13   Perceived Dyspnea (Exercise) 2   Duration Progress to 45 minutes of aerobic exercise without signs/symptoms of physical distress   Intensity THRR unchanged     Progression   Progression Continue to progress workloads to maintain intensity without signs/symptoms of physical distress.     Resistance Training   Training Prescription Yes   Weight 4   Reps 10-15     Interval Training   Interval Training Yes   Equipment T5 Nustep     Oxygen   Oxygen Continuous   Liters 3     REL-XR   Level 3   Minutes 15   METs 2.1     T5  Nustep   Level 3   SPM 99   Minutes 15   METs 2.1      History  Smoking Status  . Former Smoker  . Quit date: 10/14/2015  Smokeless Tobacco  . Former Systems developer    Goals Met:  Proper associated with RPD/PD & O2 Sat Independence with exercise equipment Exercise tolerated well Strength training completed today  Goals Unmet:  Not Applicable  Comments: Pt able to follow exercise prescription today without complaint.  Will continue to monitor for progression.    Dr. Emily Filbert is Medical Director for [place] and [facility].

## 2017-02-12 DIAGNOSIS — J449 Chronic obstructive pulmonary disease, unspecified: Secondary | ICD-10-CM | POA: Diagnosis not present

## 2017-02-12 NOTE — Progress Notes (Signed)
Daily Session Note  Patient Details  Name: Austin Patterson MRN: [id number] Date of Birth: 06-30-1956 Referring Provider:     Pulmonary Rehab from 10/13/2016 in [facility] Cardiac and Pulmonary Rehab  Referring Provider  Laverle Hobby MD      Encounter Date: 02/12/2017  Check In:     Session Check In - 02/12/17 1148      Check-In   Location [facility]-Cardiac & Pulmonary Rehab   Staff Present Alberteen Sam, MA, ACSM RCEP, Exercise Physiologist;Sheral Pfahler Oletta Darter, BA, ACSM CEP, Exercise Physiologist;Mary Kellie Shropshire, RN, BSN, MA   Supervising physician immediately available to respond to emergencies [facility] immediately available ER MD   Physician(s) Tilda Franco and McShane   Medication changes reported     No   Fall or balance concerns reported    No   Warm-up and Cool-down Performed as group-led instruction   Resistance Training Performed Yes   VAD Patient? No     Pain Assessment   Currently in Pain? No/denies         History  Smoking Status  . Former Smoker  . Quit date: 10/14/2015  Smokeless Tobacco  . Former Systems developer    Goals Met:  Proper associated with RPD/PD & O2 Sat Independence with exercise equipment Exercise tolerated well Strength training completed today  Goals Unmet:  Not Applicable  Comments: Pt able to follow exercise prescription today without complaint.  Will continue to monitor for progression.    Dr. Emily Filbert is Medical Director for [place] and [facility].

## 2017-02-15 ENCOUNTER — Encounter: Payer: Medicaid Other | Admitting: *Deleted

## 2017-02-15 DIAGNOSIS — J449 Chronic obstructive pulmonary disease, unspecified: Secondary | ICD-10-CM

## 2017-02-15 NOTE — Progress Notes (Signed)
Daily Session Note  Patient Details  Name: Austin Patterson MRN: [id number] Date of Birth: 22-Sep-1956 Referring Provider:     Pulmonary Rehab from 10/13/2016 in [facility] Cardiac and Pulmonary Rehab  Referring Provider  Laverle Hobby MD      Encounter Date: 02/15/2017  Check In:     Session Check In - 02/15/17 1137      Check-In   Location [facility]-Cardiac & Pulmonary Rehab   Staff Present Nada Maclachlan, BA, ACSM CEP, Exercise Physiologist;Laureen Owens Shark, BS, RRT, Respiratory Bertis Ruddy, BS, ACSM CEP, Exercise Physiologist   Supervising physician immediately available to respond to emergencies [facility] immediately available ER MD   Physician(s) Clearnce Hasten and Corky Downs   Medication changes reported     No   Fall or balance concerns reported    No   Warm-up and Cool-down Performed as group-led Location manager Performed Yes   VAD Patient? No     Pain Assessment   Currently in Pain? No/denies   Multiple Pain Sites No         History  Smoking Status  . Former Smoker  . Quit date: 10/14/2015  Smokeless Tobacco  . Former Systems developer    Goals Met:  Proper associated with RPD/PD & O2 Sat Independence with exercise equipment Exercise tolerated well Strength training completed today  Goals Unmet:  Not Applicable  Comments: Pt able to follow exercise prescription today without complaint.  Will continue to monitor for progression.    Dr. Emily Filbert is Medical Director for [place] and [facility].

## 2017-02-19 ENCOUNTER — Encounter: Payer: Medicaid Other | Attending: Internal Medicine | Admitting: *Deleted

## 2017-02-19 DIAGNOSIS — J449 Chronic obstructive pulmonary disease, unspecified: Secondary | ICD-10-CM | POA: Diagnosis present

## 2017-02-19 NOTE — Progress Notes (Signed)
Daily Session Note  Patient Details  Name: TYLEK BONEY MRN: [id number] Date of Birth: December 17, 1955 Referring Provider:     Pulmonary Rehab from 10/13/2016 in [facility] Cardiac and Pulmonary Rehab  Referring Provider  Laverle Hobby MD      Encounter Date: 02/19/2017  Check In:     Session Check In - 02/19/17 1116      Check-In   Location [facility]-Cardiac & Pulmonary Rehab   Staff Present Gerlene Burdock, RN, Vickki Hearing, BA, ACSM CEP, Exercise Physiologist  Darel Hong, RN   Supervising physician immediately available to respond to emergencies [facility] immediately available ER MD   Physician(s) Clearnce Hasten and Corky Downs   Medication changes reported     No   Fall or balance concerns reported    No   Tobacco Cessation No Change   Warm-up and Cool-down Performed as group-led instruction   Resistance Training Performed Yes   VAD Patient? No     Pain Assessment   Currently in Pain? No/denies         History  Smoking Status  . Former Smoker  . Quit date: 10/14/2015  Smokeless Tobacco  . Former Systems developer    Goals Met:  Proper associated with RPD/PD & O2 Sat Exercise tolerated well  Goals Unmet:  Not Applicable  Comments:     Dr. Emily Filbert is Medical Director for [place] and [facility].

## 2017-02-22 ENCOUNTER — Encounter: Payer: Self-pay | Admitting: Respiratory Therapy

## 2017-02-22 ENCOUNTER — Encounter: Payer: Medicaid Other | Admitting: *Deleted

## 2017-02-22 DIAGNOSIS — J449 Chronic obstructive pulmonary disease, unspecified: Secondary | ICD-10-CM | POA: Diagnosis not present

## 2017-02-22 NOTE — Progress Notes (Signed)
Daily Session Note  Patient Details  Name: Austin Patterson MRN: [id number] Date of Birth: 28-Feb-1956 Referring Provider:     Pulmonary Rehab from 10/13/2016 in [facility] Cardiac and Pulmonary Rehab  Referring Provider  Laverle Hobby MD      Encounter Date: 02/22/2017  Check In:     Session Check In - 02/22/17 1238      Check-In   Location [facility]-Cardiac & Pulmonary Rehab   Staff Present Carson Myrtle, BS, RRT, Respiratory Therapist;Esmerelda Finnigan Amedeo Plenty, BS, ACSM CEP, Exercise Physiologist;Amanda Oletta Darter, BA, ACSM CEP, Exercise Physiologist   Supervising physician immediately available to respond to emergencies [facility] immediately available ER MD   Physician(s) Burlene Arnt and Jimmye Norman    Medication changes reported     No   Fall or balance concerns reported    No   Warm-up and Cool-down Performed as group-led instruction   Resistance Training Performed Yes   VAD Patient? No     Pain Assessment   Currently in Pain? No/denies   Multiple Pain Sites No         History  Smoking Status  . Former Smoker  . Quit date: 10/14/2015  Smokeless Tobacco  . Former Systems developer    Goals Met:  Proper associated with RPD/PD & O2 Sat Independence with exercise equipment Exercise tolerated well Strength training completed today  Goals Unmet:  Not Applicable  Comments: Pt able to follow exercise prescription today without complaint.  Will continue to monitor for progression.    Dr. Emily Filbert is Medical Director for [place] and [facility].

## 2017-02-22 NOTE — Progress Notes (Signed)
Pulmonary Individual Treatment Plan  Patient Details  Name: STONEWALL DOSS MRN: [id number] Date of Birth: 1956/03/10 Referring Provider:     Pulmonary Rehab from 10/13/2016 in [facility] Re[facility]  Referring Provider  Laverle Hobby MD      Initial Encounter Date:    Pulmonary Rehab from 10/13/2016 in [facility] Cardiac and Pulmonary Rehab  Date  10/13/16  Referring Provider  Laverle Hobby MD      Visit Diagnosis: COPD, moderate ([place])  Patient's Home Medications on Admission:  Current Outpatient Prescriptions:    albuterol (PROVENTIL HFA;VENTOLIN HFA) 108 (90 Base) MCG/ACT inhaler, Inhale 2 puffs into the lungs every 6 (six) hours as needed for wheezing or shortness of breath., Disp: 1 Inhaler, Rfl: 2   amLODipine (NORVASC) 10 MG tablet, Take 1 tablet (10 mg total) by mouth daily., Disp: 90 tablet, Rfl: 3   aspirin EC 81 MG tablet, Take 1 tablet (81 mg total) by mouth daily., Disp: 90 tablet, Rfl: 3   atorvastatin (LIPITOR) 40 MG tablet, Take 1 tablet (40 mg total) by mouth at bedtime., Disp: 90 tablet, Rfl: 3   calcium-vitamin D (OSCAL WITH D) 500-200 MG-UNIT tablet, Take 2 tablets by mouth daily with breakfast., Disp: 60 tablet, Rfl: 0   cetirizine (ZYRTEC) 10 MG tablet, Take 10 mg by mouth at bedtime., Disp: , Rfl:    clopidogrel (PLAVIX) 75 MG tablet, Take 1 tablet (75 mg total) by mouth daily., Disp: 90 tablet, Rfl: 3   losartan-hydrochlorothiazide (HYZAAR) 100-12.5 MG tablet, Take 1 tablet by mouth daily., Disp: 90 tablet, Rfl: 3   metoprolol succinate (TOPROL-XL) 50 MG 24 hr tablet, Take 1 tablet (50 mg total) by mouth daily., Disp: 90 tablet, Rfl: 3   Multiple Vitamin (MULTIVITAMIN WITH MINERALS) TABS tablet, Take 1 tablet by mouth daily., Disp: 30 tablet, Rfl: 0   pantoprazole (PROTONIX) 40 MG tablet, Take 1 tablet (40 mg total) by mouth 2 (two) times daily., Disp: 90 tablet, Rfl: 3   Spacer/Aero Chamber Mouthpiece MISC, 1 each by Does not  apply route every 4 (four) hours as needed (wheezing)., Disp: 1 each, Rfl: 0   umeclidinium-vilanterol (ANORO ELLIPTA) 62.5-25 MCG/INH AEPB, INHALE ONE PUFF INTO THE LUNGS DAILY, Disp: 30 each, Rfl: 11  Past Medical History: Past Medical History:  Diagnosis Date   COPD (chronic obstructive pulmonary disease) ([place])    Hyperlipidemia    Hypertension    Pulmonary fibrosis ([place]) 11/2015    Tobacco Use: History  Smoking Status   Former Smoker   Quit date: 10/14/2015  Smokeless Tobacco   Former Systems developer    Labs: 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 Latest Ref Rng & Units 10/14/2015 12/09/2015 01/29/2017   Hemoglobin A1c 4.0 - 6.0 % - 4.9 -   HCO3 20.0 - 28.0 mmol/L 29.9(H) - 26.0       ADL UCSD:     Pulmonary Assessment Scores    Row Name 10/13/16 1147 01/06/17 1352       ADL UCSD   ADL Phase  -- Mid    SOB Score total 38 39    Rest 0 1    Walk 3 1    Stairs 4 4    Bath 2 3    Dress 0 0    Shop 2 3       Pulmonary Function Assessment:     Pulmonary Function Assessment - 10/13/16 1146      Pulmonary Function  Tests   RV% 68 %   DLCO% 26 %     Initial Spirometry Results   FVC% 61 %   FEV1% 52 %   FEV1/FVC Ratio 65   Comments Test date 01/29/16     Post Bronchodilator Spirometry Results   FVC% 61 %   FEV1% 52 %   FEV1/FVC Ratio 65     Breath   Shortness of Breath Yes;Limiting activity      Exercise Target Goals:    Exercise Program Goal: Individual exercise prescription set with THRR, safety & activity barriers. Participant demonstrates ability to understand and report RPE using BORG scale, to self-measure pulse accurately, and to acknowledge the importance of the exercise prescription.  Exercise Prescription Goal: Starting with aerobic activity 30 plus minutes a day, 3 days per week for initial exercise prescription. Provide home exercise prescription and guidelines that participant acknowledges understanding  prior to discharge.  Activity Barriers & Risk Stratification:     Activity Barriers & Cardiac Risk Stratification - 10/13/16 1145      Activity Barriers & Cardiac Risk Stratification   Activity Barriers Shortness of Breath;Deconditioning   Cardiac Risk Stratification Moderate      6 Minute Walk:     6 Minute Walk    Row Name 10/13/16 1155 12/18/16 1157       6 Minute Walk   Phase Initial  --    Distance 1486 feet 1020 feet    Distance % Change  -- 3 %    Walk Time 6 minutes 6 minutes    # of Rest Breaks 0 0    MPH 2.81 1.93    METS 3.95 3.21    RPE 13 15    Perceived Dyspnea  3 2    VO2 Peak 13.81 11.25    Symptoms No No    Resting HR 100 bpm 108 bpm    Resting BP 124/72 140/72    Max Ex. HR 117 bpm 119 bpm    Max Ex. BP 136/74 152/72    2 Minute Post BP 126/70  --      Interval HR   Baseline HR 100 108    1 Minute HR 109 110    2 Minute HR 112  --    3 Minute HR 113 118    4 Minute HR 113 119    5 Minute HR 117  --    6 Minute HR 116 116    2 Minute Post HR 99  --    Interval Heart Rate? Yes  --      Interval Oxygen   Interval Oxygen? Yes Yes    Baseline Oxygen Saturation % 97 % 97 %    Baseline Liters of Oxygen 2 L 3 L    1 Minute Oxygen Saturation % 95 % 95 %    1 Minute Liters of Oxygen 2 L 3 L    2 Minute Oxygen Saturation % 98 %  --    2 Minute Liters of Oxygen 2 L 3 L    3 Minute Oxygen Saturation % 98 % 94 %    3 Minute Liters of Oxygen 2 L 3 L    4 Minute Oxygen Saturation % 95 % 94 %    4 Minute Liters of Oxygen 2 L 3 L    5 Minute Oxygen Saturation % 96 %  --    5 Minute Liters of Oxygen 2 L 3 L    6 Minute  Oxygen Saturation % 96 % 95 %    6 Minute Liters of Oxygen 2 L 3 L    2 Minute Post Oxygen Saturation % 96 %  --    2 Minute Post Liters of Oxygen 2 L 3 L      Oxygen Initial Assessment:     Oxygen Initial Assessment - 12/28/16 0902      Home Oxygen   Home Oxygen Device Portable Concentrator;E-Tanks   Sleep Oxygen Prescription  Continuous   Liters per minute --  2-4   Home Exercise Oxygen Prescription Continuous   Liters per minute --  2-4   Home at Rest Exercise Oxygen Prescription Continuous   Liters per minute --  2-4   Compliance with Home Oxygen Use Yes     Initial 6 min Walk   Oxygen Used Continuous;E-Tanks   Liters per minute 2   Resting Oxygen Saturation  during 6 min walk 97 %   Exercise Oxygen Saturation  during 6 min walk 95 %     Program Oxygen Prescription   Program Oxygen Prescription Continuous;E-Tanks   Liters per minute --  2-3     Intervention   Short Term Goals To learn and exhibit compliance with exercise, home and travel O2 prescription;To Learn and understand importance of maintaining oxygen saturations>88%;To learn and demonstrate proper use of respiratory medications;To learn and understand importance of monitoring SPO2 with pulse oximeter and demonstrate accurate use of the pulse oximeter.;To learn and demonstrate proper purse lipped breathing techniques or other breathing techniques.   Long  Term Goals Exhibits compliance with exercise, home and travel O2 prescription;Maintenance of O2 saturations>88%;Compliance with respiratory medication;Verbalizes importance of monitoring SPO2 with pulse oximeter and return demonstration;Exhibits proper breathing techniques, such as purse lipped breathing or other method taught during program session;Demonstrates proper use of MDIs      Oxygen Re-Evaluation:     Oxygen Re-Evaluation    Row Name 01/06/17 1353             Program Oxygen Prescription   Program Oxygen Prescription Continuous;E-Tanks       Liters per minute 3         Home Oxygen   Home Oxygen Device Home Concentrator;E-Tanks       Sleep Oxygen Prescription Continuous       Liters per minute --  2-4l/m       Home Exercise Oxygen Prescription Continuous  2-4l/m       Home at Rest Exercise Oxygen Prescription Continuous       Liters per minute 2       Compliance  with Home Oxygen Use Yes         Goals/Expected Outcomes   Comments Mr Koc manages his oxygen well - adjusts with activity to 4l/m. He has secured his E-tank to his tractor for yardwork use.          Oxygen Discharge (Final Oxygen Re-Evaluation):     Oxygen Re-Evaluation - 01/06/17 1353      Program Oxygen Prescription   Program Oxygen Prescription Continuous;E-Tanks   Liters per minute 3     Home Oxygen   Home Oxygen Device Home Concentrator;E-Tanks   Sleep Oxygen Prescription Continuous   Liters per minute --  2-4l/m   Home Exercise Oxygen Prescription Continuous  2-4l/m   Home at Rest Exercise Oxygen Prescription Continuous   Liters per minute 2   Compliance with Home Oxygen Use Yes     Goals/Expected Outcomes   Comments  Mr Okray manages his oxygen well - adjusts with activity to 4l/m. He has secured his E-tank to his tractor for yardwork use.      Initial Exercise Prescription:     Initial Exercise Prescription - 11/06/16 0900      Oxygen   Oxygen Continuous   Liters 2     Treadmill   MPH 1.5   Grade 0.5   Minutes 15   METs 2.25     T5 Nustep   Level 3   Minutes 15   METs 2     Prescription Details   Frequency (times per week) 3   Duration Progress to 45 minutes of aerobic exercise without signs/symptoms of physical distress     Intensity   THRR 40-80% of Max Heartrate 124-148   Ratings of Perceived Exertion 11-15   Perceived Dyspnea 0-4     Progression   Progression Continue to progress workloads to maintain intensity without signs/symptoms of physical distress.     Resistance Training   Training Prescription Yes   Weight 3   Reps 10-15      Perform Capillary Blood Glucose checks as needed.  Exercise Prescription Changes:     Exercise Prescription Changes    Row Name 10/13/16 1100 10/22/16 1200 11/18/16 1200 11/19/16 1200 12/03/16 1200     Response to Exercise   Blood Pressure (Admit) 124/72 126/80  -- 110/64 138/76    Blood Pressure (Exercise) 136/74 156/84  -- 138/64 136/74   Blood Pressure (Exit) 126/70 126/70  -- 150/82 134/70   Heart Rate (Admit) 100 bpm 109 bpm  -- 101 bpm 108 bpm   Heart Rate (Exercise) 117 bpm 123 bpm  -- 117 bpm 119 bpm   Heart Rate (Exit) 99 bpm 110 bpm  -- 91 bpm 88 bpm   Oxygen Saturation (Admit) 97 % 95 %  -- 96 % 97 %   Oxygen Saturation (Exercise) 95 % 95 %  -- 93 % 95 %   Oxygen Saturation (Exit) 96 % 92 %  -- 95 % 95 %   Rating of Perceived Exertion (Exercise) 13 13  -- 13 13   Perceived Dyspnea (Exercise) 3 3  -- 1 1   Symptoms none none  -- none  --   Comments  --  -- Home exercise guidelines given 11/18/2016  --  --   Duration  --  --  --  -- Progress to 45 minutes of aerobic exercise without signs/symptoms of physical distress   Intensity  --  --  --  -- THRR unchanged     Progression   Progression  -- Continue to progress workloads to maintain intensity without signs/symptoms of physical distress.  -- Continue to progress workloads to maintain intensity without signs/symptoms of physical distress. Continue to progress workloads to maintain intensity without signs/symptoms of physical distress.     Resistance Training   Training Prescription  -- Yes  -- Yes Yes   Weight  -- 3  -- 3 4   Reps  -- 10-15  --  -- 10-15     Interval Training   Interval Training  -- No  -- No No     Oxygen   Oxygen  -- Continuous  -- Continuous Continuous   Liters  -- 2  -- 2 2     Treadmill   MPH  --  --  -- 1.5 1.5   Grade  --  --  -- 0.5 0.5   Minutes  --  --  --  15 15   METs  --  --  -- 2.25 2.25     Recumbant Elliptical   Level  -- 2  -- 2 3   RPM  -- 48  -- 39  --   Minutes  -- 15  -- 15 15   METs  -- 1.9  -- 1.8 1.8     T5 Nustep   Level  -- 3  --  --  --   Minutes  -- 15  --  --  --   METs  -- 1.9  --  --  --     Home Exercise Plan   Plans to continue exercise at  --  -- Home  Walking  --  --   Frequency  --  -- Add 2 additional days to program exercise  sessions.  --  --     Exercise Review   Progression --  walk test results  --  --  --  --   Row Name 12/17/16 1100 12/31/16 1500 01/13/17 1200 02/10/17 1200       Response to Exercise   Blood Pressure (Admit) 132/78 120/72 104/64 122/70    Blood Pressure (Exercise) 132/70 148/78 126/64 164/86    Blood Pressure (Exit) 124/60 114/66 126/70 134/78    Heart Rate (Admit) 109 bpm 111 bpm 105 bpm 107 bpm    Heart Rate (Exercise) 106 bpm 118 bpm 125 bpm 113 bpm    Heart Rate (Exit) 90 bpm 101 bpm 108 bpm 91 bpm    Oxygen Saturation (Admit) 97 % 96 % 96 % 92 %    Oxygen Saturation (Exercise) 96 % 96 % 95 % 92 %    Oxygen Saturation (Exit) 97 % 98 % 97 % 91 %    Rating of Perceived Exertion (Exercise) '13 13 13 13    ' Perceived Dyspnea (Exercise) '1 1 2 2    ' Duration Continue with 45 min of aerobic exercise without signs/symptoms of physical distress. Continue with 45 min of aerobic exercise without signs/symptoms of physical distress. Progress to 45 minutes of aerobic exercise without signs/symptoms of physical distress Progress to 45 minutes of aerobic exercise without signs/symptoms of physical distress    Intensity THRR unchanged THRR unchanged THRR unchanged THRR unchanged      Progression   Progression Continue to progress workloads to maintain intensity without signs/symptoms of physical distress. Continue to progress workloads to maintain intensity without signs/symptoms of physical distress. Continue to progress workloads to maintain intensity without signs/symptoms of physical distress. Continue to progress workloads to maintain intensity without signs/symptoms of physical distress.      Resistance Training   Training Prescription Yes Yes Yes Yes    Weight '4 4 4 4    ' Reps 10-15 10-15 10-15 10-15      Interval Training   Interval Training No Yes  -- Yes    Equipment  -- T5 Nustep  -- T5 Nustep      Oxygen   Oxygen Continuous Continuous Continuous Continuous    Liters '2 2 3 3       ' Treadmill   MPH  -- 1.5 1.8  --    Grade  -- 0.5 1  --    Minutes  -- 15 15  --    METs  -- 2.25 2.63  --      Recumbant Elliptical   Level 3 3  --  --    Minutes '15 15 15  ' --  METs 1.9 1.9 2.3  --      REL-XR   Level  --  --  -- 3    Minutes  --  --  -- 15    METs  --  --  -- 2.1      T5 Nustep   Level '3 3 3 3    ' SPM  --  --  -- 99    Minutes '15 15 15 15    ' METs 2 2  -- 2.1       Exercise Comments:     Exercise Comments    Row Name 10/13/16 1200 10/21/16 1357 11/06/16 0926 11/18/16 1241 11/19/16 1258   Exercise Comments Rusty wants to reduce the amount of oxygen he is wearing regularly. First full day of exercise!  Patient was oriented to gym and equipment including functions, settings, policies, and procedures.  Patient's individual exercise prescription and treatment plan were reviewed.  All starting workloads were established based on the results of the 6 minute walk test done at initial orientation visit.  The plan for exercise progression was also introduced and progression will be customized based on patient's performance and goals. Selby is progressing well with exercise. Reviewed home exercises with patient today. Patient plans to walk at home for exercise. Also discussed RPE, pulse, THR, restrictions, s/sx when to call MD, and weather considerations. He verbalized understanding.  Rusty is toleratiing exercise well and staff will monitor his increase in workloads.   [place] Name 12/03/16 1258 12/17/16 1111 12/31/16 1544 01/13/17 1233 01/28/17 1214   Exercise Comments Rusty continues to progress well and has increased to 4 lb for strength training. Rusty is tolerating exercise well and staff will add interval training to one machine. Rusty has tolerated the addition of intervals to his exercise well. Rusty continues to progress in MET levels and has added intensity to the TM. Stormy Card has not attended since 01/13/17.   [place] Name 02/10/17 1259           Exercise Comments Rusty has  just returned after 3 weeks absence.          Exercise Goals and Review:   Exercise Goals Re-Evaluation :   Discharge Exercise Prescription (Final Exercise Prescription Changes):     Exercise Prescription Changes - 02/10/17 1200      Response to Exercise   Blood Pressure (Admit) 122/70   Blood Pressure (Exercise) 164/86   Blood Pressure (Exit) 134/78   Heart Rate (Admit) 107 bpm   Heart Rate (Exercise) 113 bpm   Heart Rate (Exit) 91 bpm   Oxygen Saturation (Admit) 92 %   Oxygen Saturation (Exercise) 92 %   Oxygen Saturation (Exit) 91 %   Rating of Perceived Exertion (Exercise) 13   Perceived Dyspnea (Exercise) 2   Duration Progress to 45 minutes of aerobic exercise without signs/symptoms of physical distress   Intensity THRR unchanged     Progression   Progression Continue to progress workloads to maintain intensity without signs/symptoms of physical distress.     Resistance Training   Training Prescription Yes   Weight 4   Reps 10-15     Interval Training   Interval Training Yes   Equipment T5 Nustep     Oxygen   Oxygen Continuous   Liters 3     REL-XR   Level 3   Minutes 15   METs 2.1     T5 Nustep   Level 3   SPM 99   Minutes 15  METs 2.1      Nutrition:  Target Goals: Understanding of nutrition guidelines, daily intake of sodium <1589m, cholesterol <2084m calories 30% from fat and 7% or less from saturated fats, daily to have 5 or more servings of fruits and vegetables.  Biometrics:     Pre Biometrics - 10/13/16 1200      Pre Biometrics   Height 5' 6.75" (1.695 m)   Weight 177 lb (80.3 kg)   Waist Circumference 38 inches   Hip Circumference 39.5 inches   Waist to Hip Ratio 0.96 %   BMI (Calculated) 28       Nutrition Therapy Plan and Nutrition Goals:   Nutrition Discharge: Rate Your Plate Scores:   Nutrition Goals Re-Evaluation:   Nutrition Goals Discharge (Final Nutrition Goals Re-Evaluation):   Psychosocial: Target  Goals: Acknowledge presence or absence of significant depression and/or stress, maximize coping skills, provide positive support system. Participant is able to verbalize types and ability to use techniques and skills needed for reducing stress and depression.   Initial Review & Psychosocial Screening:     Initial Psych Review & Screening - 10/13/16 1153      Family Dynamics   Good Support System? Yes   Comments Mr TiBeharoes have good support from his friends. He is in the middle of a divorce from his wife of some twenty years. Mr TiWhidbytates he misses going to a job and some activities he use to perform. He is excited about [facility] and improving his activity level.      Barriers   Psychosocial barriers to participate in program The patient should benefit from training in stress management and relaxation.     Screening Interventions   Interventions Encouraged to exercise;Program counselor consult      Quality of Life Scores:     Quality of Life - 10/13/16 1157      Quality of Life Scores   Health/Function Pre 20.69 %   Socioeconomic Pre 19.14 %   Psych/Spiritual Pre 20.86 %   Family Pre 19.6 %   GLOBAL Pre 20.26 %      PHQ-9: Recent Review Flowsheet Data    [facility]/9 10/13/2016   Decreased Interest 3   Down, Depressed, Hopeless 2   PHQ - 2 Score 5   Altered sleeping 0   Tired, decreased energy 3   Change in appetite 3   Feeling bad or failure about yourself  0   Trouble concentrating 3   Moving slowly or fidgety/restless 0   Suicidal thoughts 1   PHQ-9 Score 15   Difficult doing work/chores Extremely dIfficult     Interpretation of Total Score  Total Score Depression Severity:  1-4 = Minimal depression, 5-9 = Mild depression, 10-14 = Moderate depression, 15-19 = Moderately severe depression, 20-27 = Severe depression   Psychosocial Evaluation and Intervention:     Psychosocial Evaluation - 10/28/16 1200      Psychosocial  Evaluation & Interventions   Interventions Encouraged to exercise with the program and follow exercise prescription;Relaxation education;Stress management education   Comments Counselor met with Mr. T Darene Lameroday for initial psychosocial evaluation.  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  Mr. T Darene Lameras a limited support system as he reports he is in the midst of separating from his spouse of 10 years.  H has some close friends within the community that are supportive and helpful in his life.  Mr. TDarene Lamer  states he sleeps okay and has a good appetite.  He denies a history of depression or anxiety or current symptoms; although his PHQ-9 scores were "15" at the time of admission.  counselor processed this with Mr. T reporting the Steroids he was on impacted his sleep and appetite and since coming into this program he feels more motivated and more energy.  Counselor encouraged him to consistently exercise since he has already experienced such positive benefits in a short time.  Mr. Darene Lamer also states his mood is positive currently because he is making some healthier choices for his life with the separation and with his health and working out.  Mr. Darene Lamer has goals to breathe better and possibly get off the oxygen at least during the day.  Staff will continue to follow with Mr. T throughout the course of this program.  And Counselor will follow on his depressive symptoms to see if consistency in exercise Continues to improve this as it already has.        Psychosocial Re-Evaluation:     Psychosocial Re-Evaluation    [place] Name 11/18/16 1532 12/16/16 1504 12/23/16 1157 01/06/17 1058 02/08/17 1058     Psychosocial Re-Evaluation   Current issues with  --  --  --  -- History of Depression   Comments Mr Hosea has been stressed. The cold weather is hard on his COPD, and he has recently moved due to his divorce. He is handling the stress and does enjoy coming to [place] for the exercise and  socialization.  -- Counselor Follow up with Mr. Pimenta Ardizzone) today reporting he has seen some progress since coming into this program in that he can walk further without stopping to rest and he is sleeping better. Jaquarius also states the socialization components of this program help him feel less isolated.  He admits the weather has impacted his mood and energy levels negatively but he is looking forward to daylight savings time and longer days soon.  Emerson is coping well with his recent separation from his spouse of 20 years by exercising and realizing his life is "less stressful now."  Counselor commended [place] for his progress and his commitment to improving his health. Counselor follow up with Eddie Dibbles today reporting continued progress with his strength and stamina and he continues to sleep well.  He also reports his appetite continues to be healthy currently.  Jeray continues to struggle with his mood, however, and attributes that to the weather.  His mood has been monitored throughout the course of this program and has decreased with each assessment by this counselor.  Therefore, Counselor encourages Said to speak with his Dr. about this.  He reports he sees him tomorrow and will discuss this with him.  Rai also stated he was on Vitamin D for 30 days during the month of December, 2017.  And has not had a follow up about that since. Counselor reminded Major that he admits to seasonal affective symptoms and this needs to be addressed since even today it was snowing locally.  Counselor also encouraged Younis to remind his Dr. about this in the future to see if vitamin D needs to be seasonally prescribed or possibly even light therapy during the winter months.  Walker agreed to do so.  He enjoys this class and the social components as well.  He continues to handle the stress of the separation from his spouse well and other than his mood; he reports positive progress.   Counselor follow up  with Brayam today stating he has  been sick since he mowed the grass on Good Friday (more than 3 weeks ago).  He was put on antibiotics and is now feeling a little stronger.  He continues to enjoy this class and the social and physiological benefits.  He reports his sleep and mood are stable and he is handling general stress well with exercise and routine in his life.  Counselor commended him on returning to class and continued progress in his health.     Expected Outcomes  -- Mr Otting is attending [facility] regularly and enjoys the socialization with the other participants. He is adjusting  to his new house and has enjoyed the yardwork - creating a holder for his oxygen on his tractor.   Arnoldo will continue to exercise and cope with  his current stress with a better cognitive approach.  He is encouraged to find ways to be less isolated when not coming to exercise class.   Blayze will discuss his negative mood symptoms with his Dr. tomorrow to determine if he has a vitamin deficiency or may need light therapy prescribed during the winter months in the future.  Tkai will continue to exercise consistently and is commended for his progress made while in this program.   Jordell will continue to exercise consistently for his mood and his health overall.  He will attend and participate in the psychoeducational components of this program to improve his coping strategies.     Interventions  --  --  --  -- Stress management education   Continue Psychosocial Services   --  --  -- Follow up required by staff Follow up required by staff      Psychosocial Discharge (Final Psychosocial Re-Evaluation):     Psychosocial Re-Evaluation - 02/08/17 1058      Psychosocial Re-Evaluation   Current issues with History of Depression   Comments Counselor follow up with Eddie Dibbles today stating he has been sick since he mowed the grass on Good Friday (more than 3 weeks ago).  He was put on antibiotics and is now feeling a little stronger.  He continues to enjoy this  class and the social and physiological benefits.  He reports his sleep and mood are stable and he is handling general stress well with exercise and routine in his life.  Counselor commended him on returning to class and continued progress in his health.     Expected Outcomes Xayne will continue to exercise consistently for his mood and his health overall.  He will attend and participate in the psychoeducational components of this program to improve his coping strategies.     Interventions Stress management education   Continue Psychosocial Services  Follow up required by staff      Education: Education Goals: Education classes will be provided on a weekly basis, covering required topics. Participant will state understanding/return demonstration of topics presented.  Learning Barriers/Preferences:     Learning Barriers/Preferences - 10/13/16 1146      Learning Barriers/Preferences   Learning Barriers None   Learning Preferences None      Education Topics: Initial Evaluation Education: - Verbal, written and demonstration of respiratory meds, RPE/PD scales, oximetry and breathing techniques. Instruction on use of nebulizers and MDIs: cleaning and proper use, rinsing mouth with steroid doses and importance of monitoring MDI activations.   Pulmonary Rehab from 02/15/2017 in [facility] Cardiac and Pulmonary Rehab  Date  10/13/16  Educator  LB  Instruction Review Code  2- meets  goals/outcomes      General Nutrition Guidelines/Fats and Fiber: -Group instruction provided by verbal, written material, models and posters to present the general guidelines for heart healthy nutrition. Gives an explanation and review of dietary fats and fiber.   Pulmonary Rehab from 02/15/2017 in [facility] Cardiac and Pulmonary Rehab  Date  02/15/17  Educator  CR  Instruction Review Code  2- meets goals/outcomes      Controlling Sodium/Reading Food Labels: -Group verbal and written material supporting the discussion of  sodium use in heart healthy nutrition. Review and explanation with models, verbal and written materials for utilization of the food label.   Pulmonary Rehab from 02/15/2017 in [facility] Cardiac and Pulmonary Rehab  Date  01/04/17  Educator  CR  Instruction Review Code  2- meets goals/outcomes      Exercise Physiology & Risk Factors: - Group verbal and written instruction with models to review the exercise physiology of the cardiovascular system and associated critical values. Details cardiovascular disease risk factors and the goals associated with each risk factor.   Pulmonary Rehab from 02/15/2017 in [facility] Cardiac and Pulmonary Rehab  Date  10/21/16  Educator  [facility]  Instruction Review Code  2- meets goals/outcomes      Aerobic Exercise & Resistance Training: - Gives group verbal and written discussion on the health impact of inactivity. On the components of aerobic and resistive training programs and the benefits of this training and how to safely progress through these programs.   Pulmonary Rehab from 02/15/2017 in  [facility] Cardiac and Pulmonary Rehab  Date  11/18/16  Educator  [facility]  Instruction Review Code  2- meets goals/outcomes      Flexibility, Balance, General Exercise Guidelines: - Provides group verbal and written instruction on the benefits of flexibility and balance training programs. Provides general exercise guidelines with specific guidelines to those with heart or lung disease. Demonstration and skill practice provided.   Pulmonary Rehab from 02/15/2017 in [facility] Cardiac and Pulmonary Rehab  Date  12/11/16  Educator  [facility]  Instruction Review Code  2- meets goals/outcomes      Stress Management: - Provides group verbal and written instruction about the health risks of elevated stress, cause of high stress, and healthy ways to reduce stress.   Depression: - Provides group verbal and written instruction on the correlation between heart/lung disease and depressed mood, treatment  options, and the stigmas associated with seeking treatment.   Pulmonary Rehab from 02/15/2017 in [facility] Cardiac and Pulmonary Rehab  Date  12/23/16  Educator  [facility]  Instruction Review Code  2- meets goals/outcomes      Exercise & Equipment Safety: - Individual verbal instruction and demonstration of equipment use and safety with use of the equipment.   Infection Prevention: - Provides verbal and written material to individual with discussion of infection control including proper hand washing and proper equipment cleaning during exercise session.   Pulmonary Rehab from 02/15/2017 in [facility] Cardiac and Pulmonary Rehab  Date  10/13/16  Educator  LB  Instruction Review Code  2- meets goals/outcomes      Falls Prevention: - Provides verbal and written material to individual with discussion of falls prevention and safety.   Pulmonary Rehab from 02/15/2017 in [facility] Cardiac and Pulmonary Rehab  Date  10/13/16  Educator  LB  Instruction Review Code  2- meets goals/outcomes      Diabetes: - Individual verbal and written instruction to review signs/symptoms of diabetes, desired ranges of glucose level fasting, after meals and  with exercise. Advice that pre and post exercise glucose checks will be done for 3 sessions at entry of program.   Chronic Lung Diseases: - Group verbal and written instruction to review new updates, new respiratory medications, new advancements in procedures and treatments. Provide informative websites and "800" numbers of self-education.   Pulmonary Rehab from 02/15/2017 in [facility] Cardiac and Pulmonary Rehab  Date  11/11/16  Educator  LB  Instruction Review Code  2- meets goals/outcomes      Lung Procedures: - Group verbal and written instruction to describe testing methods done to diagnose lung disease. Review the outcome of test results. Describe the treatment choices: Pulmonary Function Tests, ABGs and oximetry.   Energy Conservation: - Provide group verbal and  written instruction for methods to conserve energy, plan and organize activities. Instruct on pacing techniques, use of adaptive equipment and posture/positioning to relieve shortness of breath.   Pulmonary Rehab from 02/15/2017 in [facility] Cardiac and Pulmonary Rehab  Date  12/16/16  Educator  [facility]  Instruction Review Code  2- meets goals/outcomes      Triggers: - Group verbal and written instruction to review types of environmental controls: home humidity, furnaces, filters, dust mite/pet prevention, HEPA vacuums. To discuss weather changes, air quality and the benefits of nasal washing.   Pulmonary Rehab from 02/15/2017 in [facility] Cardiac and Pulmonary Rehab  Date  02/10/17  Educator  LB  Instruction Review Code  2- meets goals/outcomes      Exacerbations: - Group verbal and written instruction to provide: warning signs, infection symptoms, calling MD promptly, preventive modes, and value of vaccinations. Review: effective airway clearance, coughing and/or vibration techniques. Create an Sports administrator.   Pulmonary Rehab from 02/15/2017 in [facility] Cardiac and Pulmonary Rehab  Date  12/02/16  Educator  LB  Instruction Review Code  2- meets goals/outcomes      Oxygen: - Individual and group verbal and written instruction on oxygen therapy. Includes supplement oxygen, available portable oxygen systems, continuous and intermittent flow rates, oxygen safety, concentrators, and Medicare reimbursement for oxygen.   Pulmonary Rehab from 02/15/2017 in [facility] Cardiac and Pulmonary Rehab  Date  10/13/16  Educator  LB  Instruction Review Code  2- meets goals/outcomes      Respiratory Medications: - Group verbal and written instruction to review medications for lung disease. Drug class, frequency, complications, importance of spacers, rinsing mouth after steroid MDI's, and proper cleaning methods for nebulizers.   Pulmonary Rehab from 02/15/2017 in [facility] Cardiac and Pulmonary Rehab  Date  10/13/16  Educator   LB  Instruction Review Code  2- meets goals/outcomes      AED/CPR: - Group verbal and written instruction with the use of models to demonstrate the basic use of the AED with the basic ABC's of resuscitation.   Pulmonary Rehab from 02/15/2017 in [facility] Cardiac and Pulmonary Rehab  Date  02/12/17  Educator  MA  Instruction Review Code  2- meets goals/outcomes      Breathing Retraining: - Provides individuals verbal and written instruction on purpose, frequency, and proper technique of diaphragmatic breathing and pursed-lipped breathing. Applies individual practice skills.   Pulmonary Rehab from 02/15/2017 in [facility] Cardiac and Pulmonary Rehab  Date  10/13/16  Educator  LB  Instruction Review Code  2- meets goals/outcomes      Anatomy and Physiology of the Lungs: - Group verbal and written instruction with the use of models to provide basic lung anatomy and physiology related to function, structure and complications of  lung disease.   Pulmonary Rehab from 02/15/2017 in [facility] Cardiac and Pulmonary Rehab  Date  01/08/17  Educator  LB  Instruction Review Code  2- meets goals/outcomes      Heart Failure: - Group verbal and written instruction on the basics of heart failure: signs/symptoms, treatments, explanation of ejection fraction, enlarged heart and cardiomyopathy.   Sleep Apnea: - Individual verbal and written instruction to review Obstructive Sleep Apnea. Review of risk factors, methods for diagnosing and types of masks and machines for OSA.   Anxiety: - Provides group, verbal and written instruction on the correlation between heart/lung disease and anxiety, treatment options, and management of anxiety.   Pulmonary Rehab from 02/15/2017 in [facility] Cardiac and Pulmonary Rehab  Date  10/28/16  Educator  [facility]  Instruction Review Code  2- Meets goals/outcomes      Relaxation: - Provides group, verbal and written instruction about the benefits of relaxation for patients with heart/lung  disease. Also provides patients with examples of relaxation techniques.   Pulmonary Rehab from 02/15/2017 in [facility] Cardiac and Pulmonary Rehab  Date  11/25/16  Educator  [facility]  Instruction Review Code  2- Meets goals/outcomes      Knowledge Questionnaire Score:     Knowledge Questionnaire Score - 10/13/16 1146      Knowledge Questionnaire Score   Pre Score 7/10       Core Components/Risk Factors/Patient Goals at Admission:     Personal Goals and Risk Factors at Admission - 10/13/16 1150      Core Components/Risk Factors/Patient Goals on Admission    Weight Management Yes;Weight Loss   Intervention Weight Management: Develop a combined nutrition and exercise program designed to reach desired caloric intake, while maintaining appropriate intake of nutrient and fiber, sodium and fats, and appropriate energy expenditure required for the weight goal.;Weight Management: Provide education and appropriate resources to help participant work on and attain dietary goals.   Admit Weight 177 lb (80.3 kg)   Goal Weight: Short Term 172 lb (78 kg)   Goal Weight: Long Term 167 lb (75.8 kg)   Expected Outcomes Short Term: Continue to assess and modify interventions until short term weight is achieved;Long Term: Adherence to nutrition and physical activity/exercise program aimed toward attainment of established weight goal;Weight Loss: Understanding of general recommendations for a balanced deficit meal plan, which promotes 1-2 lb weight loss per week and includes a negative energy balance of [phone] kcal/d;Understanding recommendations for meals to include 15-35% energy as protein, 25-35% energy from fat, 35-60% energy from carbohydrates, less than 261m of dietary cholesterol, 20-35 gm of total fiber daily;Understanding of distribution of calorie intake throughout the day with the consumption of 4-5 meals/snacks   Sedentary Yes   Intervention Provide advice, education, support and counseling about physical  activity/exercise needs.;Develop an individualized exercise prescription for aerobic and resistive training based on initial evaluation findings, risk stratification, comorbidities and participant's personal goals.   Expected Outcomes Achievement of increased cardiorespiratory fitness and enhanced flexibility, muscular endurance and strength shown through measurements of functional capacity and personal statement of participant.   Increase Strength and Stamina Yes   Intervention Provide advice, education, support and counseling about physical activity/exercise needs.;Develop an individualized exercise prescription for aerobic and resistive training based on initial evaluation findings, risk stratification, comorbidities and participant's personal goals.   Expected Outcomes Achievement of increased cardiorespiratory fitness and enhanced flexibility, muscular endurance and strength shown through measurements of functional capacity and personal statement of participant.   Improve shortness of  breath with ADL's Yes   Intervention Provide education, individualized exercise plan and daily activity instruction to help decrease symptoms of SOB with activities of daily living.   Expected Outcomes Short Term: Achieves a reduction of symptoms when performing activities of daily living.   Develop more efficient breathing techniques such as purse lipped breathing and diaphragmatic breathing; and practicing self-pacing with activity Yes   Intervention Provide education, demonstration and support about specific breathing techniuqes utilized for more efficient breathing. Include techniques such as pursed lipped breathing, diaphragmatic breathing and self-pacing activity.   Expected Outcomes Short Term: Participant will be able to demonstrate and use breathing techniques as needed throughout daily activities.   Increase knowledge of respiratory medications and ability to use respiratory devices properly  Yes  ProAir MDI;  oxygen 2-4l/m   Intervention Provide education and demonstration as needed of appropriate use of medications, inhalers, and oxygen therapy.   Expected Outcomes Short Term: Achieves understanding of medications use. Understands that oxygen is a medication prescribed by physician. Demonstrates appropriate use of inhaler and oxygen therapy.   Hypertension Yes   Intervention Provide education on lifestyle modifcations including regular physical activity/exercise, weight management, moderate sodium restriction and increased consumption of fresh fruit, vegetables, and low fat dairy, alcohol moderation, and smoking cessation.;Monitor prescription use compliance.   Expected Outcomes Short Term: Continued assessment and intervention until BP is < 140/53m HG in hypertensive participants. < 130/835mHG in hypertensive participants with diabetes, heart failure or chronic kidney disease.;Long Term: Maintenance of blood pressure at goal levels.   Lipids Yes   Intervention Provide education and support for participant on nutrition & aerobic/resistive exercise along with prescribed medications to achieve LDL <7028mHDL >44m59m Expected Outcomes Short Term: Participant states understanding of desired cholesterol values and is compliant with medications prescribed. Participant is following exercise prescription and nutrition guidelines.;Long Term: Cholesterol controlled with medications as prescribed, with individualized exercise RX and with personalized nutrition plan. Value goals: LDL < 70mg63mL > 40 mg.      Core Components/Risk Factors/Patient Goals Review:      Goals and Risk Factor Review    Row Name 10/21/16 1357 10/26/16 1113 11/18/16 1516 12/07/16 1116 01/06/17 1356     Core Components/Risk Factors/Patient Goals Review   Personal Goals Review Develop more efficient breathing techniques such as purse lipped breathing and diaphragmatic breathing and practicing self-pacing with activity. Develop more  efficient breathing techniques such as purse lipped breathing and diaphragmatic breathing and practicing self-pacing with activity.;Increase Strength and Stamina Sedentary;Increase Strength and Stamina;Improve shortness of breath with ADL's;Develop more efficient breathing techniques such as purse lipped breathing and diaphragmatic breathing and practicing self-pacing with activity.;Increase knowledge of respiratory medications and ability to use respiratory devices properly.;Hypertension;Lipids Increase Strength and Stamina;Develop more efficient breathing techniques such as purse lipped breathing and diaphragmatic breathing and practicing self-pacing with activity.;Lipids;Hypertension;Weight Management/Obesity;Improve shortness of breath with ADL's Weight Management/Obesity;Improve shortness of breath with ADL's;Increase knowledge of respiratory medications and ability to use respiratory devices properly.;Develop more efficient breathing techniques such as purse lipped breathing and diaphragmatic breathing and practicing self-pacing with activity.;Hypertension;Lipids   Review Reviewed pursed lip breathing technique with Rusty today. Patient tolerated his first session well and stated that it didn't bother him the next day. He has been using PLB and stated that it is helping him a little.  Mr TilliTeagleprogressed with his exercise goals. He has maintained acceptable O2Sat's with his exercise on 2l/m. Mr Glatt does self-adjust his oxygen liter flow  and is very content with the gas E tank for portability. He knows the advantage to continuous flow.  Using PLB and pacing , he is active at his new house and has made a carrier for his O2 tank for his tractor. Today I gave him a spacer for his ProAir with instructions. His BP is acceptable, and he is complaint with his lipid medication.  Patient stated that he has maintained his weight and his energy levels and stamina are about the same. Stated that weather  is his major limiting factor with breathing and SOB, but feels comfortable using PLB to mainage SOB. Blood pressure and lipid panels were reported as being good.   Mr Greenhalgh is compliant with his medications, including his MDI's. He has no problems with his lipid level,  and BP have been acceptable in [facility]. His shortness of breath has not improved, but he manages it with PLB and pacing. Mr Cawley states that the exercise has increased his energy level and plans to continue exercise in our [employer] or [employer].   Expected Outcomes Rusty will become more profieicent at using PLB and will eventually become independent in using PLB without queuing. Stormy Card will attend class regularly and continue to gain increases in strength and stamina. He will continue to use PLB to help control SOB.  Continue progressing with his exercise and knowledge of self-management of his COPD. Patient will continue to attend [facility] regularly and progress towards these goals.  Continue progressing with his exercise goals and planning exercise after [facility].      Core Components/Risk Factors/Patient Goals at Discharge (Final Review):      Goals and Risk Factor Review - 01/06/17 1356      Core Components/Risk Factors/Patient Goals Review   Personal Goals Review Weight Management/Obesity;Improve shortness of breath with ADL's;Increase knowledge of respiratory medications and ability to use respiratory devices properly.;Develop more efficient breathing techniques such as purse lipped breathing and diaphragmatic breathing and practicing self-pacing with activity.;Hypertension;Lipids   Review Mr Burkel is compliant with his medications, including his MDI's. He has no problems with his lipid level,  and BP have been acceptable in [facility]. His shortness of breath has not improved, but he manages it with PLB and pacing. Mr Helget states that the exercise has increased his energy level and plans to continue  exercise in our [employer] or [employer].   Expected Outcomes Continue progressing with his exercise goals and planning exercise after [facility].      ITP Comments:     ITP Comments    Row Name 02/01/17 0839 02/22/17 0840         ITP Comments Mr Blish called and has been out from [place] due to illness. He has been treated with prednisone and an antibiotic and hopes to return to [facility] soon. 30 day note review with Dr Emily Filbert, Medical Director of [facility]         Comments: 30 day note review with Dr Emily Filbert, Medical Director of [facility]

## 2017-02-24 DIAGNOSIS — J449 Chronic obstructive pulmonary disease, unspecified: Secondary | ICD-10-CM | POA: Diagnosis not present

## 2017-02-24 NOTE — Progress Notes (Signed)
Daily Session Note  Patient Details  Name: Austin Patterson MRN: [id number] Date of Birth: [birth year] Referring Provider:     Pulmonary Rehab from 10/13/2016 in [facility] Cardiac and Pulmonary Rehab  Referring Provider  Laverle Hobby MD      Encounter Date: 02/24/2017  Check In:     Session Check In - 02/24/17 1239      Check-In   Location [facility]-Cardiac & Pulmonary Rehab   Staff Present Carson Myrtle, BS, RRT, Respiratory Lennie Hummer, MA, ACSM RCEP, Exercise Physiologist;Teisha Trowbridge Oletta Darter, BA, ACSM CEP, Exercise Physiologist   Supervising physician immediately available to respond to emergencies [facility] immediately available ER MD   Physician(s) Alfred Levins and Jimmye Norman   Medication changes reported     No   Fall or balance concerns reported    No   Warm-up and Cool-down Performed as group-led Location manager Performed Yes   VAD Patient? No     Pain Assessment   Currently in Pain? No/denies   Multiple Pain Sites No           Exercise Prescription Changes - 02/24/17 1200      Response to Exercise   Blood Pressure (Admit) 136/70   Blood Pressure (Exercise) 128/74   Blood Pressure (Exit) 132/72   Heart Rate (Admit) 121 bpm   Heart Rate (Exercise) 130 bpm   Heart Rate (Exit) 105 bpm   Oxygen Saturation (Admit) 91 %   Oxygen Saturation (Exercise) 96 %   Oxygen Saturation (Exit) 97 %   Rating of Perceived Exertion (Exercise) 13   Perceived Dyspnea (Exercise) 2   Duration Continue with 45 min of aerobic exercise without signs/symptoms of physical distress.   Intensity THRR unchanged     Progression   Progression Continue to progress workloads to maintain intensity without signs/symptoms of physical distress.     Resistance Training   Training Prescription Yes   Weight 4   Reps 10-15     Interval Training   Interval Training Yes   Equipment T5 Nustep     Oxygen   Oxygen Continuous   Liters 3     Treadmill   MPH 1.4   Grade 4    Minutes 15   METs 2.84     Recumbant Elliptical   Level 3   Minutes 15   METs 1.7     T5 Nustep   Level 3   SPM 70   Minutes 15   METs 2      History  Smoking Status  . Former Smoker  . Quit date: 10/14/2015  Smokeless Tobacco  . Former Systems developer    Goals Met:  Proper associated with RPD/PD & O2 Sat Independence with exercise equipment Exercise tolerated well Strength training completed today  Goals Unmet:  Not Applicable  Comments: Pt able to follow exercise prescription today without complaint.  Will continue to monitor for progression.    Dr. Emily Filbert is Medical Director for [place] and [facility].

## 2017-02-26 ENCOUNTER — Encounter: Payer: Medicaid Other | Admitting: *Deleted

## 2017-02-26 DIAGNOSIS — J449 Chronic obstructive pulmonary disease, unspecified: Secondary | ICD-10-CM | POA: Diagnosis not present

## 2017-02-26 NOTE — Progress Notes (Signed)
Daily Session Note  Patient Details  Name: Austin Patterson MRN: [id number] Date of Birth: Jan 01, 1956 Referring Provider:     Pulmonary Rehab from 10/13/2016 in [facility] Cardiac and Pulmonary Rehab  Referring Provider  Laverle Hobby MD      Encounter Date: 02/26/2017  Check In:     Session Check In - 02/26/17 1041      Check-In   Location [facility]-Cardiac & Pulmonary Rehab   Staff Present Gerlene Burdock, RN, Vickki Hearing, BA, ACSM CEP, Exercise Physiologist;Susanne Bice, RN, BSN, CCRP   Supervising physician immediately available to respond to emergencies [facility] immediately available ER MD   Physician(s) Dr. Jacqualine Code and Dr. Alfred Levins   Medication changes reported     No   Fall or balance concerns reported    No   Tobacco Cessation No Change   Warm-up and Cool-down Performed as group-led instruction   Resistance Training Performed Yes   VAD Patient? No     Pain Assessment   Currently in Pain? No/denies         History  Smoking Status  . Former Smoker  . Quit date: 10/14/2015  Smokeless Tobacco  . Former Systems developer    Goals Met:  Proper associated with RPD/PD & O2 Sat Exercise tolerated well  Goals Unmet:  Not Applicable  Comments:     Dr. Emily Filbert is Medical Director for [place] and [facility].

## 2017-03-01 ENCOUNTER — Encounter: Payer: Medicaid Other | Admitting: *Deleted

## 2017-03-01 DIAGNOSIS — J449 Chronic obstructive pulmonary disease, unspecified: Secondary | ICD-10-CM

## 2017-03-01 NOTE — Progress Notes (Signed)
Daily Session Note  Patient Details  Name: Austin Patterson MRN: [id number] Date of Birth: 07-Oct-1956 Referring Provider:     Pulmonary Rehab from 10/13/2016 in [facility] Cardiac and Pulmonary Rehab  Referring Provider  Laverle Hobby MD      Encounter Date: 03/01/2017  Check In:     Session Check In - 03/01/17 1038      Check-In   Location [facility]-Cardiac & Pulmonary Rehab   Staff Present Carson Myrtle, BS, RRT, Respiratory Therapist;Andreah Goheen Amedeo Plenty, BS, ACSM CEP, Exercise Physiologist;Amanda Oletta Darter, BA, ACSM CEP, Exercise Physiologist   Supervising physician immediately available to respond to emergencies [facility] immediately available ER MD   Physician(s) Alfred Levins and Corky Downs   Medication changes reported     No   Fall or balance concerns reported    No   Warm-up and Cool-down Performed as group-led Location manager Performed Yes   VAD Patient? No     Pain Assessment   Currently in Pain? No/denies   Multiple Pain Sites No         History  Smoking Status  . Former Smoker  . Quit date: 10/14/2015  Smokeless Tobacco  . Former Systems developer    Goals Met:  Proper associated with RPD/PD & O2 Sat Independence with exercise equipment Exercise tolerated well Strength training completed today  Goals Unmet:  Not Applicable  Comments: Pt able to follow exercise prescription today without complaint.  Will continue to monitor for progression.    Dr. Emily Filbert is Medical Director for [place] and [facility].

## 2017-03-01 NOTE — Patient Instructions (Signed)
Discharge Instructions  Patient Details  Name: Austin Patterson MRN: [id number] Date of Birth: [birth year] Referring Provider:  Laverle Hobby, *   Number of Visits: 36 Reason for Discharge:  Patient reached a stable level of exercise. Patient independent in their exercise.  Smoking History:  History  Smoking Status   Former Smoker   Quit date: 10/14/2015  Smokeless Tobacco   Former User    Diagnosis:  COPD, moderate ([place])  Initial Exercise Prescription:     Initial Exercise Prescription - 11/06/16 0900      Oxygen   Oxygen Continuous   Liters 2     Treadmill   MPH 1.5   Grade 0.5   Minutes 15   METs 2.25     T5 Nustep   Level 3   Minutes 15   METs 2     Prescription Details   Frequency (times per week) 3   Duration Progress to 45 minutes of aerobic exercise without signs/symptoms of physical distress     Intensity   THRR 40-80% of Max Heartrate 124-148   Ratings of Perceived Exertion 11-15   Perceived Dyspnea 0-4     Progression   Progression Continue to progress workloads to maintain intensity without signs/symptoms of physical distress.     Resistance Training   Training Prescription Yes   Weight 3   Reps 10-15      Discharge Exercise Prescription (Final Exercise Prescription Changes):     Exercise Prescription Changes - 02/24/17 1200      Response to Exercise   Blood Pressure (Admit) 136/70   Blood Pressure (Exercise) 128/74   Blood Pressure (Exit) 132/72   Heart Rate (Admit) 121 bpm   Heart Rate (Exercise) 130 bpm   Heart Rate (Exit) 105 bpm   Oxygen Saturation (Admit) 91 %   Oxygen Saturation (Exercise) 96 %   Oxygen Saturation (Exit) 97 %   Rating of Perceived Exertion (Exercise) 13   Perceived Dyspnea (Exercise) 2   Duration Continue with 45 min of aerobic exercise without signs/symptoms of physical distress.   Intensity THRR unchanged     Progression   Progression Continue to progress workloads to maintain  intensity without signs/symptoms of physical distress.     Resistance Training   Training Prescription Yes   Weight 4   Reps 10-15     Interval Training   Interval Training Yes   Equipment T5 Nustep     Oxygen   Oxygen Continuous   Liters 3     Treadmill   MPH 1.4   Grade 4   Minutes 15   METs 2.84     Recumbant Elliptical   Level 3   Minutes 15   METs 1.7     T5 Nustep   Level 3   SPM 70   Minutes 15   METs 2      Functional Capacity:     6 Minute Walk    Row Name 10/13/16 1155 12/18/16 1157 02/26/17 1139     6 Minute Walk   Phase Initial  -- Discharge   Distance 1486 feet 1020 feet 1112 feet   Distance % Change  -- 3 % 13 %   Walk Time 6 minutes 6 minutes 6 minutes   # of Rest Breaks 0 0 0   MPH 2.81 1.93 2.1   METS 3.95 3.21 3.6   RPE 13 15 13    Perceived Dyspnea  3 2 2    VO2 Peak 13.81 11.25 12.6  Symptoms No No No   Resting HR 100 bpm 108 bpm 110 bpm   Resting BP 124/72 140/72 136/80   Max Ex. HR 117 bpm 119 bpm 129 bpm   Max Ex. BP 136/74 152/72 162/70   2 Minute Post BP 126/70  --  --     Interval HR   Baseline HR 100 108 110   1 Minute HR 109 110 120   2 Minute HR 112  -- 124   3 Minute HR 113 118 125   4 Minute HR 113 119 126   5 Minute HR 117  -- 127   6 Minute HR 116 116 129   2 Minute Post HR 99  --  --   Interval Heart Rate? Yes  --  --     Interval Oxygen   Interval Oxygen? Yes Yes  --   Baseline Oxygen Saturation % 97 % 97 % 96 %   Baseline Liters of Oxygen 2 L 3 L 3 L   1 Minute Oxygen Saturation % 95 % 95 % 94 %   1 Minute Liters of Oxygen 2 L 3 L 3 L   2 Minute Oxygen Saturation % 98 %  -- 94 %   2 Minute Liters of Oxygen 2 L 3 L 3 L   3 Minute Oxygen Saturation % 98 % 94 % 94 %   3 Minute Liters of Oxygen 2 L 3 L 3 L   4 Minute Oxygen Saturation % 95 % 94 % 94 %   4 Minute Liters of Oxygen 2 L 3 L 3 L   5 Minute Oxygen Saturation % 96 %  -- 94 %   5 Minute Liters of Oxygen 2 L 3 L  --   6 Minute Oxygen Saturation %  96 % 95 % 94 %   6 Minute Liters of Oxygen 2 L 3 L 3 L   2 Minute Post Oxygen Saturation % 96 %  --  --   2 Minute Post Liters of Oxygen 2 L 3 L  --      Quality of Life:     Quality of Life - 03/01/17 1530      Quality of Life Scores   Health/Function Pre 20.69 %   Health/Function Post 20.69 %   Health/Function % Change 0 %   Socioeconomic Pre 19.14 %   Socioeconomic Post 20.67 %   Socioeconomic % Change  7.99 %   Psych/Spiritual Pre 20.86 %   Psych/Spiritual Post 21 %   Psych/Spiritual % Change 0.67 %   Family Pre 19.6 %   Family Post 20 %   Family % Change 2.04 %   GLOBAL Pre 20.26 %   GLOBAL Post 20.65 %   GLOBAL % Change 1.92 %      Personal Goals: Goals established at orientation with interventions provided to work toward goal.     Personal Goals and Risk Factors at Admission - 10/13/16 1150      Core Components/Risk Factors/Patient Goals on Admission    Weight Management Yes;Weight Loss   Intervention Weight Management: Develop a combined nutrition and exercise program designed to reach desired caloric intake, while maintaining appropriate intake of nutrient and fiber, sodium and fats, and appropriate energy expenditure required for the weight goal.;Weight Management: Provide education and appropriate resources to help participant work on and attain dietary goals.   Admit Weight 177 lb (80.3 kg)   Goal Weight: Short Term 172 lb (  78 kg)   Goal Weight: Long Term 167 lb (75.8 kg)   Expected Outcomes Short Term: Continue to assess and modify interventions until short term weight is achieved;Long Term: Adherence to nutrition and physical activity/exercise program aimed toward attainment of established weight goal;Weight Loss: Understanding of general recommendations for a balanced deficit meal plan, which promotes 1-2 lb weight loss per week and includes a negative energy balance of [id number] kcal/d;Understanding recommendations for meals to include 15-35% energy as  protein, 25-35% energy from fat, 35-60% energy from carbohydrates, less than 200mg  of dietary cholesterol, 20-35 gm of total fiber daily;Understanding of distribution of calorie intake throughout the day with the consumption of 4-5 meals/snacks   Sedentary Yes   Intervention Provide advice, education, support and counseling about physical activity/exercise needs.;Develop an individualized exercise prescription for aerobic and resistive training based on initial evaluation findings, risk stratification, comorbidities and participant's personal goals.   Expected Outcomes Achievement of increased cardiorespiratory fitness and enhanced flexibility, muscular endurance and strength shown through measurements of functional capacity and personal statement of participant.   Increase Strength and Stamina Yes   Intervention Provide advice, education, support and counseling about physical activity/exercise needs.;Develop an individualized exercise prescription for aerobic and resistive training based on initial evaluation findings, risk stratification, comorbidities and participant's personal goals.   Expected Outcomes Achievement of increased cardiorespiratory fitness and enhanced flexibility, muscular endurance and strength shown through measurements of functional capacity and personal statement of participant.   Improve shortness of breath with ADL's Yes   Intervention Provide education, individualized exercise plan and daily activity instruction to help decrease symptoms of SOB with activities of daily living.   Expected Outcomes Short Term: Achieves a reduction of symptoms when performing activities of daily living.   Develop more efficient breathing techniques such as purse lipped breathing and diaphragmatic breathing; and practicing self-pacing with activity Yes   Intervention Provide education, demonstration and support about specific breathing techniuqes utilized for more efficient breathing. Include  techniques such as pursed lipped breathing, diaphragmatic breathing and self-pacing activity.   Expected Outcomes Short Term: Participant will be able to demonstrate and use breathing techniques as needed throughout daily activities.   Increase knowledge of respiratory medications and ability to use respiratory devices properly  Yes  ProAir MDI; oxygen 2-4l/m   Intervention Provide education and demonstration as needed of appropriate use of medications, inhalers, and oxygen therapy.   Expected Outcomes Short Term: Achieves understanding of medications use. Understands that oxygen is a medication prescribed by physician. Demonstrates appropriate use of inhaler and oxygen therapy.   Hypertension Yes   Intervention Provide education on lifestyle modifcations including regular physical activity/exercise, weight management, moderate sodium restriction and increased consumption of fresh fruit, vegetables, and low fat dairy, alcohol moderation, and smoking cessation.;Monitor prescription use compliance.   Expected Outcomes Short Term: Continued assessment and intervention until BP is < 140/42mm HG in hypertensive participants. < 130/23mm HG in hypertensive participants with diabetes, heart failure or chronic kidney disease.;Long Term: Maintenance of blood pressure at goal levels.   Lipids Yes   Intervention Provide education and support for participant on nutrition & aerobic/resistive exercise along with prescribed medications to achieve LDL 70mg , HDL >40mg .   Expected Outcomes Short Term: Participant states understanding of desired cholesterol values and is compliant with medications prescribed. Participant is following exercise prescription and nutrition guidelines.;Long Term: Cholesterol controlled with medications as prescribed, with individualized exercise RX and with personalized nutrition plan. Value goals: LDL < 70mg , HDL > 40 mg.  Personal Goals Discharge:     Goals and Risk Factor Review -  03/01/17 1559      Core Components/Risk Factors/Patient Goals Review   Personal Goals Review Weight Management/Obesity;Improve shortness of breath with ADL's;Increase knowledge of respiratory medications and ability to use respiratory devices properly.;Develop more efficient breathing techniques such as purse lipped breathing and diaphragmatic breathing and practicing self-pacing with activity.;Hypertension;Lipids   Review With graduation a few sessions away, Mr Ellenbecker is looking forward to continuing his exercise in the independent gym, [employer]. He manages his COPD with proper use of  his MDI's and oxygen. Using PLB and pacing, Mr Kinser is able to continue his yardwork. He has adapted his tractor to carry his oxygen E cylinder. His blood pressures in [facility] have been acceptable and he has no problem with his lipids.Mr Beaulieu is commended for his hard work and commitment in [employer].   Expected Outcomes Continue exercising and applying [facility] education for self-management of his COPD.      Nutrition & Weight - Outcomes:     Pre Biometrics - 10/13/16 1200      Pre Biometrics   Height 5' 6.75" (1.695 m)   Weight 177 lb (80.3 kg)   Waist Circumference 38 inches   Hip Circumference 39.5 inches   Waist to Hip Ratio 0.96 %   BMI (Calculated) 28       Nutrition:   Nutrition Discharge:   Education Questionnaire Score:     Knowledge Questionnaire Score - 03/01/17 1528      Knowledge Questionnaire Score   Post Score 10/10      Goals reviewed with patient; copy given to patient.

## 2017-03-03 DIAGNOSIS — J449 Chronic obstructive pulmonary disease, unspecified: Secondary | ICD-10-CM | POA: Diagnosis not present

## 2017-03-03 NOTE — Progress Notes (Signed)
Daily Session Note  Patient Details  Name: Austin Patterson MRN: [id number] Date of Birth: May 28, 1956 Referring Provider:     Pulmonary Rehab from 10/13/2016 in [facility] Cardiac and Pulmonary Rehab  Referring Provider  Laverle Hobby MD      Encounter Date: 03/03/2017  Check In:     Session Check In - 03/03/17 1224      Check-In   Location [facility]-Cardiac & Pulmonary Rehab   Staff Present Carson Myrtle, BS, RRT, Respiratory Lennie Hummer, MA, ACSM RCEP, Exercise Physiologist;Amanda Oletta Darter, BA, ACSM CEP, Exercise Physiologist   Supervising physician immediately available to respond to emergencies [facility] immediately available ER MD   Physician(s) Alfred Levins and [place]   Medication changes reported     No   Fall or balance concerns reported    No   Warm-up and Cool-down Performed as group-led instruction   Resistance Training Performed Yes   VAD Patient? No     Pain Assessment   Currently in Pain? No/denies         History  Smoking Status  . Former Smoker  . Quit date: 10/14/2015  Smokeless Tobacco  . Former Systems developer    Goals Met:  Proper associated with RPD/PD & O2 Sat Independence with exercise equipment Exercise tolerated well Strength training completed today  Goals Unmet:  Not Applicable  Comments: Pt able to follow exercise prescription today without complaint.  Will continue to monitor for progression.    Dr. Emily Filbert is Medical Director for [place] and [facility].

## 2017-03-05 DIAGNOSIS — J449 Chronic obstructive pulmonary disease, unspecified: Secondary | ICD-10-CM

## 2017-03-05 NOTE — Progress Notes (Signed)
Daily Session Note  Patient Details  Name: Austin Patterson MRN: [id number] Date of Birth: 06-May-1956 Referring Provider:     Pulmonary Rehab from 10/13/2016 in [facility] Cardiac and Pulmonary Rehab  Referring Provider  Laverle Hobby MD      Encounter Date: 03/05/2017  Check In:     Session Check In - 03/05/17 1134      Check-In   Location [facility]-Cardiac & Pulmonary Rehab   Staff Present Gerlene Burdock, RN, BSN;Susanne Bice, RN, BSN, Lance Sell, BA, ACSM CEP, Exercise Physiologist   Supervising physician immediately available to respond to emergencies [facility] immediately available ER MD   Physician(s) Reita Cliche and Event organiser   Medication changes reported     No   Fall or balance concerns reported    No   Warm-up and Cool-down Performed as group-led Location manager Performed Yes   VAD Patient? No     Pain Assessment   Currently in Pain? No/denies   Multiple Pain Sites No         History  Smoking Status  . Former Smoker  . Quit date: 10/14/2015  Smokeless Tobacco  . Former Systems developer    Goals Met:  Proper associated with RPD/PD & O2 Sat Independence with exercise equipment Exercise tolerated well Strength training completed today  Goals Unmet:  Not Applicable  Comments:  [place] graduated today from pulmonary rehab with 36 sessions completed.  Details of the patient's exercise prescription and what the needs to do in order to continue the prescription and progress were discussed with patient.  Patient was given a copy of prescription and goals.  Patient verbalized understanding.  Ab plans to continue to exercise by joining [facility] fitness center.    Dr. Emily Filbert is Medical Director for [place] and [facility].

## 2017-03-08 NOTE — Progress Notes (Signed)
Pulmonary Individual Treatment Plan  Patient Details  Name: NTHONY LEFFERTS MRN: [id number] Date of Birth: 1956-09-18 Referring Provider:     Pulmonary Rehab from 10/13/2016 in [facility] and Pulmonary Rehab  Referring Provider  Laverle Hobby MD      Initial Encounter Date:    Pulmonary Rehab from 10/13/2016 in [facility] Cardiac and Pulmonary Rehab  Date  10/13/16  Referring Provider  Laverle Hobby MD      Visit Diagnosis: COPD, moderate ([place])  Patient's Home Medications on Admission:  Current Outpatient Prescriptions:    albuterol (PROVENTIL HFA;VENTOLIN HFA) 108 (90 Base) MCG/ACT inhaler, Inhale 2 puffs into the lungs every 6 (six) hours as needed for wheezing or shortness of breath., Disp: 1 Inhaler, Rfl: 2   amLODipine (NORVASC) 10 MG tablet, Take 1 tablet (10 mg total) by mouth daily., Disp: 90 tablet, Rfl: 3   aspirin EC 81 MG tablet, Take 1 tablet (81 mg total) by mouth daily., Disp: 90 tablet, Rfl: 3   atorvastatin (LIPITOR) 40 MG tablet, Take 1 tablet (40 mg total) by mouth at bedtime., Disp: 90 tablet, Rfl: 3   calcium-vitamin D (OSCAL WITH D) 500-200 MG-UNIT tablet, Take 2 tablets by mouth daily with breakfast., Disp: 60 tablet, Rfl: 0   cetirizine (ZYRTEC) 10 MG tablet, Take 10 mg by mouth at bedtime., Disp: , Rfl:    clopidogrel (PLAVIX) 75 MG tablet, Take 1 tablet (75 mg total) by mouth daily., Disp: 90 tablet, Rfl: 3   losartan-hydrochlorothiazide (HYZAAR) 100-12.5 MG tablet, Take 1 tablet by mouth daily., Disp: 90 tablet, Rfl: 3   metoprolol succinate (TOPROL-XL) 50 MG 24 hr tablet, Take 1 tablet (50 mg total) by mouth daily., Disp: 90 tablet, Rfl: 3   Multiple Vitamin (MULTIVITAMIN WITH MINERALS) TABS tablet, Take 1 tablet by mouth daily., Disp: 30 tablet, Rfl: 0   pantoprazole (PROTONIX) 40 MG tablet, Take 1 tablet (40 mg total) by mouth 2 (two) times daily., Disp: 90 tablet, Rfl: 3   Spacer/Aero Chamber Mouthpiece MISC, 1 each by Does not  apply route every 4 (four) hours as needed (wheezing)., Disp: 1 each, Rfl: 0   umeclidinium-vilanterol (ANORO ELLIPTA) 62.5-25 MCG/INH AEPB, INHALE ONE PUFF INTO THE LUNGS DAILY, Disp: 30 each, Rfl: 11  Past Medical History: Past Medical History:  Diagnosis Date   COPD (chronic obstructive pulmonary disease) ([place])    Hyperlipidemia    Hypertension    Pulmonary fibrosis ([place]) 11/2015    Tobacco Use: History  Smoking Status   Former Smoker   Quit date: 10/14/2015  Smokeless Tobacco   Former Systems developer    Labs: 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 Latest Ref Rng & Units 10/14/2015 12/09/2015 01/29/2017   Hemoglobin A1c 4.0 - 6.0 % - 4.9 -   HCO3 20.0 - 28.0 mmol/L 29.9(H) - 26.0       ADL UCSD:     Pulmonary Assessment Scores    Row Name 10/13/16 1147 01/06/17 1352 03/01/17 1529     ADL UCSD   ADL Phase  -- Mid Exit   SOB Score total 38 39 30   Rest 0 1 3   Walk '3 1 2   ' Stairs '4 4 3   ' Bath '2 3 1   ' Dress 0 0 0   Shop '2 3 1      ' Pulmonary Function Assessment:     Pulmonary Function Assessment - 10/13/16 1146      Pulmonary Function  Tests   RV% 68 %   DLCO% 26 %     Initial Spirometry Results   FVC% 61 %   FEV1% 52 %   FEV1/FVC Ratio 65   Comments Test date 01/29/16     Post Bronchodilator Spirometry Results   FVC% 61 %   FEV1% 52 %   FEV1/FVC Ratio 65     Breath   Shortness of Breath Yes;Limiting activity      Exercise Target Goals:    Exercise Program Goal: Individual exercise prescription set with THRR, safety & activity barriers. Participant demonstrates ability to understand and report RPE using BORG scale, to self-measure pulse accurately, and to acknowledge the importance of the exercise prescription.  Exercise Prescription Goal: Starting with aerobic activity 30 plus minutes a day, 3 days per week for initial exercise prescription. Provide home exercise prescription and guidelines that participant  acknowledges understanding prior to discharge.  Activity Barriers & Risk Stratification:     Activity Barriers & Cardiac Risk Stratification - 10/13/16 1145      Activity Barriers & Cardiac Risk Stratification   Activity Barriers Shortness of Breath;Deconditioning   Cardiac Risk Stratification Moderate      6 Minute Walk:     6 Minute Walk    Row Name 10/13/16 1155 12/18/16 1157 02/26/17 1139     6 Minute Walk   Phase Initial  -- Discharge   Distance 1486 feet 1020 feet 1112 feet   Distance % Change  -- 3 % 13 %   Walk Time 6 minutes 6 minutes 6 minutes   # of Rest Breaks 0 0 0   MPH 2.81 1.93 2.1   METS 3.95 3.21 3.6   RPE '13 15 13   ' Perceived Dyspnea  '3 2 2   ' VO2 Peak 13.81 11.25 12.6   Symptoms No No No   Resting HR 100 bpm 108 bpm 110 bpm   Resting BP 124/72 140/72 136/80   Max Ex. HR 117 bpm 119 bpm 129 bpm   Max Ex. BP 136/74 152/72 162/70   2 Minute Post BP 126/70  --  --     Interval HR   Baseline HR 100 108 110   1 Minute HR 109 110 120   2 Minute HR 112  -- 124   3 Minute HR 113 118 125   4 Minute HR 113 119 126   5 Minute HR 117  -- 127   6 Minute HR 116 116 129   2 Minute Post HR 99  --  --   Interval Heart Rate? Yes  --  --     Interval Oxygen   Interval Oxygen? Yes Yes  --   Baseline Oxygen Saturation % 97 % 97 % 96 %   Baseline Liters of Oxygen 2 L 3 L 3 L   1 Minute Oxygen Saturation % 95 % 95 % 94 %   1 Minute Liters of Oxygen 2 L 3 L 3 L   2 Minute Oxygen Saturation % 98 %  -- 94 %   2 Minute Liters of Oxygen 2 L 3 L 3 L   3 Minute Oxygen Saturation % 98 % 94 % 94 %   3 Minute Liters of Oxygen 2 L 3 L 3 L   4 Minute Oxygen Saturation % 95 % 94 % 94 %   4 Minute Liters of Oxygen 2 L 3 L 3 L   5 Minute Oxygen Saturation % 96 %  --  94 %   5 Minute Liters of Oxygen 2 L 3 L  --   6 Minute Oxygen Saturation % 96 % 95 % 94 %   6 Minute Liters of Oxygen 2 L 3 L 3 L   2 Minute Post Oxygen Saturation % 96 %  --  --   2 Minute Post Liters of  Oxygen 2 L 3 L  --     Oxygen Initial Assessment:     Oxygen Initial Assessment - 12/28/16 0902      Home Oxygen   Home Oxygen Device Portable Concentrator;E-Tanks   Sleep Oxygen Prescription Continuous   Liters per minute --  2-4   Home Exercise Oxygen Prescription Continuous   Liters per minute --  2-4   Home at Rest Exercise Oxygen Prescription Continuous   Liters per minute --  2-4   Compliance with Home Oxygen Use Yes     Initial 6 min Walk   Oxygen Used Continuous;E-Tanks   Liters per minute 2   Resting Oxygen Saturation  during 6 min walk 97 %   Exercise Oxygen Saturation  during 6 min walk 95 %     Program Oxygen Prescription   Program Oxygen Prescription Continuous;E-Tanks   Liters per minute --  2-3     Intervention   Short Term Goals To learn and exhibit compliance with exercise, home and travel O2 prescription;To Learn and understand importance of maintaining oxygen saturations>88%;To learn and demonstrate proper use of respiratory medications;To learn and understand importance of monitoring SPO2 with pulse oximeter and demonstrate accurate use of the pulse oximeter.;To learn and demonstrate proper purse lipped breathing techniques or other breathing techniques.   Long  Term Goals Exhibits compliance with exercise, home and travel O2 prescription;Maintenance of O2 saturations>88%;Compliance with respiratory medication;Verbalizes importance of monitoring SPO2 with pulse oximeter and return demonstration;Exhibits proper breathing techniques, such as purse lipped breathing or other method taught during program session;Demonstrates proper use of MDIs      Oxygen Re-Evaluation:     Oxygen Re-Evaluation    Row Name 01/06/17 1353 03/01/17 1538           Program Oxygen Prescription   Program Oxygen Prescription Continuous;E-Tanks Continuous;E-Tanks      Liters per minute 3 3      Comments  -- 2-4l/m        Home Oxygen   Home Oxygen Device Home  Concentrator;E-Tanks Home Concentrator;E-Tanks      Sleep Oxygen Prescription Continuous Continuous      Liters per minute --  2-4l/m 3  2-4l/m      Home Exercise Oxygen Prescription Continuous  2-4l/m Continuous      Liters per minute  -- 4  2-4l/m      Home at Rest Exercise Oxygen Prescription Continuous  --      Liters per minute 2 2      Compliance with Home Oxygen Use Yes Yes        Goals/Expected Outcomes   Comments Mr Cribb manages his oxygen well - adjusts with activity to 4l/m. He has secured his E-tank to his tractor for yardwork use.  Mr Macioce manages his oxygen well. He prefers the E tank which lasts longer than the portable concentrator. With activity, he does increase his O2 to4l/m and checks his O2Sat's with his finger oximeter.      Goals/Expected Outcomes  -- Continue self-management of his COPD by using his oxygen 2-4l/m at rest and activity.  Oxygen Discharge (Final Oxygen Re-Evaluation):     Oxygen Re-Evaluation - 03/01/17 1538      Program Oxygen Prescription   Program Oxygen Prescription Continuous;E-Tanks   Liters per minute 3   Comments 2-4l/m     Home Oxygen   Home Oxygen Device Home Concentrator;E-Tanks   Sleep Oxygen Prescription Continuous   Liters per minute 3  2-4l/m   Home Exercise Oxygen Prescription Continuous   Liters per minute 4  2-4l/m   Liters per minute 2   Compliance with Home Oxygen Use Yes     Goals/Expected Outcomes   Comments  Mr Bufford manages his oxygen well. He prefers the E tank which lasts longer than the portable concentrator. With activity, he does increase his O2 to4l/m and checks his O2Sat's with his finger oximeter.   Goals/Expected Outcomes Continue self-management of his COPD by using his oxygen 2-4l/m at rest and activity.      Initial Exercise Prescription:     Initial Exercise Prescription - 11/06/16 0900      Oxygen   Oxygen Continuous   Liters 2     Treadmill   MPH 1.5   Grade  0.5   Minutes 15   METs 2.25     T5 Nustep   Level 3   Minutes 15   METs 2     Prescription Details   Frequency (times per week) 3   Duration Progress to 45 minutes of aerobic exercise without signs/symptoms of physical distress     Intensity   THRR 40-80% of Max Heartrate 124-148   Ratings of Perceived Exertion 11-15   Perceived Dyspnea 0-4     Progression   Progression Continue to progress workloads to maintain intensity without signs/symptoms of physical distress.     Resistance Training   Training Prescription Yes   Weight 3   Reps 10-15      Perform Capillary Blood Glucose checks as needed.  Exercise Prescription Changes:     Exercise Prescription Changes    Row Name 10/13/16 1100 10/22/16 1200 11/18/16 1200 11/19/16 1200 12/03/16 1200     Response to Exercise   Blood Pressure (Admit) 124/72 126/80  -- 110/64 138/76   Blood Pressure (Exercise) 136/74 156/84  -- 138/64 136/74   Blood Pressure (Exit) 126/70 126/70  -- 150/82 134/70   Heart Rate (Admit) 100 bpm 109 bpm  -- 101 bpm 108 bpm   Heart Rate (Exercise) 117 bpm 123 bpm  -- 117 bpm 119 bpm   Heart Rate (Exit) 99 bpm 110 bpm  -- 91 bpm 88 bpm   Oxygen Saturation (Admit) 97 % 95 %  -- 96 % 97 %   Oxygen Saturation (Exercise) 95 % 95 %  -- 93 % 95 %   Oxygen Saturation (Exit) 96 % 92 %  -- 95 % 95 %   Rating of Perceived Exertion (Exercise) 13 13  -- 13 13   Perceived Dyspnea (Exercise) 3 3  -- 1 1   Symptoms none none  -- none  --   Comments  --  -- Home exercise guidelines given 11/18/2016  --  --   Duration  --  --  --  -- Progress to 45 minutes of aerobic exercise without signs/symptoms of physical distress   Intensity  --  --  --  -- THRR unchanged     Progression   Progression  -- Continue to progress workloads to maintain intensity without signs/symptoms of physical distress.  -- Continue to  progress workloads to maintain intensity without signs/symptoms of physical distress. Continue to progress  workloads to maintain intensity without signs/symptoms of physical distress.     Resistance Training   Training Prescription  -- Yes  -- Yes Yes   Weight  -- 3  -- 3 4   Reps  -- 10-15  --  -- 10-15     Interval Training   Interval Training  -- No  -- No No     Oxygen   Oxygen  -- Continuous  -- Continuous Continuous   Liters  -- 2  -- 2 2     Treadmill   MPH  --  --  -- 1.5 1.5   Grade  --  --  -- 0.5 0.5   Minutes  --  --  -- 15 15   METs  --  --  -- 2.25 2.25     Recumbant Elliptical   Level  -- 2  -- 2 3   RPM  -- 48  -- 39  --   Minutes  -- 15  -- 15 15   METs  -- 1.9  -- 1.8 1.8     T5 Nustep   Level  -- 3  --  --  --   Minutes  -- 15  --  --  --   METs  -- 1.9  --  --  --     Home Exercise Plan   Plans to continue exercise at  --  -- Home  Walking  --  --   Frequency  --  -- Add 2 additional days to program exercise sessions.  --  --     Exercise Review   Progression --  walk test results  --  --  --  --   Row Name 12/17/16 1100 12/31/16 1500 01/13/17 1200 02/10/17 1200 02/24/17 1200     Response to Exercise   Blood Pressure (Admit) 132/78 120/72 104/64 122/70 136/70   Blood Pressure (Exercise) 132/70 148/78 126/64 164/86 128/74   Blood Pressure (Exit) 124/60 114/66 126/70 134/78 132/72   Heart Rate (Admit) 109 bpm 111 bpm 105 bpm 107 bpm 121 bpm   Heart Rate (Exercise) 106 bpm 118 bpm 125 bpm 113 bpm 130 bpm   Heart Rate (Exit) 90 bpm 101 bpm 108 bpm 91 bpm 105 bpm   Oxygen Saturation (Admit) 97 % 96 % 96 % 92 % 91 %   Oxygen Saturation (Exercise) 96 % 96 % 95 % 92 % 96 %   Oxygen Saturation (Exit) 97 % 98 % 97 % 91 % 97 %   Rating of Perceived Exertion (Exercise) '13 13 13 13 13   ' Perceived Dyspnea (Exercise) '1 1 2 2 2   ' Duration Continue with 45 min of aerobic exercise without signs/symptoms of physical distress. Continue with 45 min of aerobic exercise without signs/symptoms of physical distress. Progress to 45 minutes of aerobic exercise without  signs/symptoms of physical distress Progress to 45 minutes of aerobic exercise without signs/symptoms of physical distress Continue with 45 min of aerobic exercise without signs/symptoms of physical distress.   Intensity THRR unchanged THRR unchanged THRR unchanged THRR unchanged THRR unchanged     Progression   Progression Continue to progress workloads to maintain intensity without signs/symptoms of physical distress. Continue to progress workloads to maintain intensity without signs/symptoms of physical distress. Continue to progress workloads to maintain intensity without signs/symptoms of physical distress. Continue to progress workloads to maintain intensity without signs/symptoms  of physical distress. Continue to progress workloads to maintain intensity without signs/symptoms of physical distress.     Resistance Training   Training Prescription Yes Yes Yes Yes Yes   Weight '4 4 4 4 4   ' Reps 10-15 10-15 10-15 10-15 10-15     Interval Training   Interval Training No Yes  -- Yes Yes   Equipment  -- T5 Nustep  -- T5 Nustep T5 Nustep     Oxygen   Oxygen Continuous Continuous Continuous Continuous Continuous   Liters '2 2 3 3 3     ' Treadmill   MPH  -- 1.5 1.8  -- 1.4   Grade  -- 0.5 1  -- 4   Minutes  -- 15 15  -- 15   METs  -- 2.25 2.63  -- 2.84     Recumbant Elliptical   Level 3 3  --  -- 3   Minutes '15 15 15  ' -- 15   METs 1.9 1.9 2.3  -- 1.7     REL-XR   Level  --  --  -- 3  --   Minutes  --  --  -- 15  --   METs  --  --  -- 2.1  --     T5 Nustep   Level '3 3 3 3 3   ' SPM  --  --  -- 99 70   Minutes '15 15 15 15 15   ' METs 2 2  -- 2.1 2      Exercise Comments:     Exercise Comments    Row Name 10/13/16 1200 10/21/16 1357 11/06/16 0926 11/18/16 1241 11/19/16 1258   Exercise Comments Rusty wants to reduce the amount of oxygen he is wearing regularly. First full day of exercise!  Patient was oriented to gym and equipment including functions, settings, policies, and  procedures.  Patient's individual exercise prescription and treatment plan were reviewed.  All starting workloads were established based on the results of the 6 minute walk test done at initial orientation visit.  The plan for exercise progression was also introduced and progression will be customized based on patient's performance and goals. Alieu is progressing well with exercise. Reviewed home exercises with patient today. Patient plans to walk at home for exercise. Also discussed RPE, pulse, THR, restrictions, s/sx when to call MD, and weather considerations. He verbalized understanding.  Rusty is toleratiing exercise well and staff will monitor his increase in workloads.   [place] Name 12/03/16 1258 12/17/16 1111 12/31/16 1544 01/13/17 1233 01/28/17 1214   Exercise Comments Rusty continues to progress well and has increased to 4 lb for strength training. Rusty is tolerating exercise well and staff will add interval training to one machine. Rusty has tolerated the addition of intervals to his exercise well. Rusty continues to progress in MET levels and has added intensity to the TM. Stormy Card has not attended since 01/13/17.   [place] Name 02/10/17 1259 02/24/17 1243         Exercise Comments Rusty has just returned after 3 weeks absence. Rusty is tolerating exercise well since returning.         Exercise Goals and Review:   Exercise Goals Re-Evaluation :   Discharge Exercise Prescription (Final Exercise Prescription Changes):     Exercise Prescription Changes - 02/24/17 1200      Response to Exercise   Blood Pressure (Admit) 136/70   Blood Pressure (Exercise) 128/74   Blood Pressure (Exit) 132/72   Heart  Rate (Admit) 121 bpm   Heart Rate (Exercise) 130 bpm   Heart Rate (Exit) 105 bpm   Oxygen Saturation (Admit) 91 %   Oxygen Saturation (Exercise) 96 %   Oxygen Saturation (Exit) 97 %   Rating of Perceived Exertion (Exercise) 13   Perceived Dyspnea (Exercise) 2   Duration Continue with 45 min  of aerobic exercise without signs/symptoms of physical distress.   Intensity THRR unchanged     Progression   Progression Continue to progress workloads to maintain intensity without signs/symptoms of physical distress.     Resistance Training   Training Prescription Yes   Weight 4   Reps 10-15     Interval Training   Interval Training Yes   Equipment T5 Nustep     Oxygen   Oxygen Continuous   Liters 3     Treadmill   MPH 1.4   Grade 4   Minutes 15   METs 2.84     Recumbant Elliptical   Level 3   Minutes 15   METs 1.7     T5 Nustep   Level 3   SPM 70   Minutes 15   METs 2      Nutrition:  Target Goals: Understanding of nutrition guidelines, daily intake of sodium <1529m, cholesterol <20106m calories 30% from fat and 7% or less from saturated fats, daily to have 5 or more servings of fruits and vegetables.  Biometrics:     Pre Biometrics - 10/13/16 1200      Pre Biometrics   Height 5' 6.75" (1.695 m)   Weight 177 lb (80.3 kg)   Waist Circumference 38 inches   Hip Circumference 39.5 inches   Waist to Hip Ratio 0.96 %   BMI (Calculated) 28       Nutrition Therapy Plan and Nutrition Goals:   Nutrition Discharge: Rate Your Plate Scores:   Nutrition Goals Re-Evaluation:   Nutrition Goals Discharge (Final Nutrition Goals Re-Evaluation):   Psychosocial: Target Goals: Acknowledge presence or absence of significant depression and/or stress, maximize coping skills, provide positive support system. Participant is able to verbalize types and ability to use techniques and skills needed for reducing stress and depression.   Initial Review & Psychosocial Screening:     Initial Psych Review & Screening - 10/13/16 1153      Family Dynamics   Good Support System? Yes   Comments Mr TiDelimaoes have good support from his friends. He is in the middle of a divorce from his wife of some twenty years. Mr TiMoffatttates he misses going to a job and some  activities he use to perform. He is excited about [facility] and improving his activity level.      Barriers   Psychosocial barriers to participate in program The patient should benefit from training in stress management and relaxation.     Screening Interventions   Interventions Encouraged to exercise;Program counselor consult      Quality of Life Scores:     Quality of Life - 03/01/17 1530      Quality of Life Scores   Health/Function Pre 20.69 %   Health/Function Post 20.69 %   Health/Function % Change 0 %   Socioeconomic Pre 19.14 %   Socioeconomic Post 20.67 %   Socioeconomic % Change  7.99 %   Psych/Spiritual Pre 20.86 %   Psych/Spiritual Post 21 %   Psych/Spiritual % Change 0.67 %   Family Pre 19.6 %   Family Post 20 %  Family % Change 2.04 %   GLOBAL Pre 20.26 %   GLOBAL Post 20.65 %   GLOBAL % Change 1.92 %      PHQ-9: Recent Review Flowsheet Data    Depression screen [facility] 2/9 03/01/2017 10/13/2016   Decreased Interest 0 3   Down, Depressed, Hopeless 0 2   PHQ - 2 Score 0 5   Altered sleeping 0 0   Tired, decreased energy 1 3   Change in appetite 0 3   Feeling bad or failure about yourself  0 0   Trouble concentrating 0 3   Moving slowly or fidgety/restless 0 0   Suicidal thoughts 0 1   PHQ-9 Score 1 15   Difficult doing work/chores Not difficult at all Extremely dIfficult     Interpretation of Total Score  Total Score Depression Severity:  1-4 = Minimal depression, 5-9 = Mild depression, 10-14 = Moderate depression, 15-19 = Moderately severe depression, 20-27 = Severe depression   Psychosocial Evaluation and Intervention:     Psychosocial Evaluation - 03/01/17 1030      Discharge Psychosocial Assessment & Intervention   Comments Counselor met with Eddie Dibbles [facility]) today for his discharge evaluation from Pulmonary Rehab.  He reports having benefitted a great deal from this program with increased stamina and the ability to just move and do more in life.   He reports continuing to sleep well and having a good mood.  Stormy Card continues to have stress in his life as he is on a fixed income, but he remains in a positive mood.  He plans to join the gym here at [facility] to continue exercising consistently.  Rusty reports he quit smoking 1.5 years ago and has made a commitment to improving his health.  Counselor commended Rusty for all his progress made and working towards being healthier in general.       Psychosocial Re-Evaluation:     Psychosocial Re-Evaluation    [place] Name 11/18/16 1532 12/16/16 1504 12/23/16 1157 01/06/17 1058 02/08/17 1058     Psychosocial Re-Evaluation   Current issues with  --  --  --  -- History of Depression   Comments Mr Jansson has been stressed. The cold weather is hard on his COPD, and he has recently moved due to his divorce. He is handling the stress and does enjoy coming to [place] for the exercise and socialization.  -- Counselor Follow up with Mr. Sandler Hayashi) today reporting he has seen some progress since coming into this program in that he can walk further without stopping to rest and he is sleeping better. Ibraham also states the socialization components of this program help him feel less isolated.  He admits the weather has impacted his mood and energy levels negatively but he is looking forward to daylight savings time and longer days soon.  Christphor is coping well with his recent separation from his spouse of 20 years by exercising and realizing his life is "less stressful now."  Counselor commended Goerge for his progress and his commitment to improving his health. Counselor follow up with Eddie Dibbles today reporting continued progress with his strength and stamina and he continues to sleep well.  He also reports his appetite continues to be healthy currently.  Winfield continues to struggle with his mood, however, and attributes that to the weather.  His mood has been monitored throughout the course of this program and has decreased  with each assessment by this counselor.  Therefore, Counselor encourages Lewin to speak with  his Dr. about this.  He reports he sees him tomorrow and will discuss this with him.  Fabrice also stated he was on Vitamin D for 30 days during the month of December, 2017.  And has not had a follow up about that since. Counselor reminded Devone that he admits to seasonal affective symptoms and this needs to be addressed since even today it was snowing locally.  Counselor also encouraged Zaidyn to remind his Dr. about this in the future to see if vitamin D needs to be seasonally prescribed or possibly even light therapy during the winter months.  Pratham agreed to do so.  He enjoys this class and the social components as well.  He continues to handle the stress of the separation from his spouse well and other than his mood; he reports positive progress.   Counselor follow up with Venson today stating he has been sick since he mowed the grass on Good Friday (more than 3 weeks ago).  He was put on antibiotics and is now feeling a little stronger.  He continues to enjoy this class and the social and physiological benefits.  He reports his sleep and mood are stable and he is handling general stress well with exercise and routine in his life.  Counselor commended him on returning to class and continued progress in his health.     Expected Outcomes  -- Mr Rooke is attending [facility] regularly and enjoys the socialization with the other participants. He is adjusting  to his new house and has enjoyed the yardwork - creating a holder for his oxygen on his tractor.   Jairen will continue to exercise and cope with  his current stress with a better cognitive approach.  He is encouraged to find ways to be less isolated when not coming to exercise class.   Joselito will discuss his negative mood symptoms with his Dr. tomorrow to determine if he has a vitamin deficiency or may need light therapy prescribed during the winter months in the future.   Zyaire will continue to exercise consistently and is commended for his progress made while in this program.   Avante will continue to exercise consistently for his mood and his health overall.  He will attend and participate in the psychoeducational components of this program to improve his coping strategies.     Interventions  --  --  --  -- Stress management education   Continue Psychosocial Services   --  --  -- Follow up required by staff Follow up required by staff      Psychosocial Discharge (Final Psychosocial Re-Evaluation):     Psychosocial Re-Evaluation - 02/08/17 1058      Psychosocial Re-Evaluation   Current issues with History of Depression   Comments Counselor follow up with Eddie Dibbles today stating he has been sick since he mowed the grass on Good Friday (more than 3 weeks ago).  He was put on antibiotics and is now feeling a little stronger.  He continues to enjoy this class and the social and physiological benefits.  He reports his sleep and mood are stable and he is handling general stress well with exercise and routine in his life.  Counselor commended him on returning to class and continued progress in his health.     Expected Outcomes Clemon will continue to exercise consistently for his mood and his health overall.  He will attend and participate in the psychoeducational components of this program to improve his coping strategies.  Interventions Stress management education   Continue Psychosocial Services  Follow up required by staff      Education: Education Goals: Education classes will be provided on a weekly basis, covering required topics. Participant will state understanding/return demonstration of topics presented.  Learning Barriers/Preferences:     Learning Barriers/Preferences - 10/13/16 1146      Learning Barriers/Preferences   Learning Barriers None   Learning Preferences None      Education Topics: Initial Evaluation Education: - Verbal, written and  demonstration of respiratory meds, RPE/PD scales, oximetry and breathing techniques. Instruction on use of nebulizers and MDIs: cleaning and proper use, rinsing mouth with steroid doses and importance of monitoring MDI activations.   Pulmonary Rehab from 03/05/2017 in [facility] Cardiac and Pulmonary Rehab  Date  10/13/16  Educator  LB  Instruction Review Code  2- meets goals/outcomes      General Nutrition Guidelines/Fats and Fiber: -Group instruction provided by verbal, written material, models and posters to present the general guidelines for heart healthy nutrition. Gives an explanation and review of dietary fats and fiber.   Pulmonary Rehab from 03/05/2017 in [facility] Cardiac and Pulmonary Rehab  Date  02/15/17  Educator  CR  Instruction Review Code  2- meets goals/outcomes      Controlling Sodium/Reading Food Labels: -Group verbal and written material supporting the discussion of sodium use in heart healthy nutrition. Review and explanation with models, verbal and written materials for utilization of the food label.   Pulmonary Rehab from 03/05/2017 in [facility] Cardiac and Pulmonary Rehab  Date  01/04/17  Educator  CR  Instruction Review Code  2- meets goals/outcomes      Exercise Physiology & Risk Factors: - Group verbal and written instruction with models to review the exercise physiology of the cardiovascular system and associated critical values. Details cardiovascular disease risk factors and the goals associated with each risk factor.   Pulmonary Rehab from 03/05/2017 in [facility] Cardiac and Pulmonary Rehab  Date  10/21/16  Educator  [facility]  Instruction Review Code  2- meets goals/outcomes      Aerobic Exercise & Resistance Training: - Gives group verbal and written discussion on the health impact of inactivity. On the components of aerobic and resistive training programs and the benefits of this training and how to safely progress through these programs.   Pulmonary Rehab from 03/05/2017 in  [facility] Cardiac and Pulmonary Rehab  Date  11/18/16  Educator  [facility]  Instruction Review Code  2- meets goals/outcomes      Flexibility, Balance, General Exercise Guidelines: - Provides group verbal and written instruction on the benefits of flexibility and balance training programs. Provides general exercise guidelines with specific guidelines to those with heart or lung disease. Demonstration and skill practice provided.   Pulmonary Rehab from 03/05/2017 in [facility] Cardiac and Pulmonary Rehab  Date  12/11/16  Educator  [place]  Instruction Review Code  2- meets goals/outcomes      Stress Management: - Provides group verbal and written instruction about the health risks of elevated stress, cause of high stress, and healthy ways to reduce stress.   Depression: - Provides group verbal and written instruction on the correlation between heart/lung disease and depressed mood, treatment options, and the stigmas associated with seeking treatment.   Pulmonary Rehab from 03/05/2017 in [facility] Cardiac and Pulmonary Rehab  Date  12/23/16  Educator  [facility]  Instruction Review Code  2- meets goals/outcomes      Exercise & Equipment Safety: - Individual verbal  instruction and demonstration of equipment use and safety with use of the equipment.   Infection Prevention: - Provides verbal and written material to individual with discussion of infection control including proper hand washing and proper equipment cleaning during exercise session.   Pulmonary Rehab from 03/05/2017 in [facility] Cardiac and Pulmonary Rehab  Date  10/13/16  Educator  LB  Instruction Review Code  2- meets goals/outcomes      Falls Prevention: - Provides verbal and written material to individual with discussion of falls prevention and safety.   Pulmonary Rehab from 03/05/2017 in [facility] Cardiac and Pulmonary Rehab  Date  10/13/16  Educator  LB  Instruction Review Code  2- meets goals/outcomes      Diabetes: - Individual verbal and  written instruction to review signs/symptoms of diabetes, desired ranges of glucose level fasting, after meals and with exercise. Advice that pre and post exercise glucose checks will be done for 3 sessions at entry of program.   Chronic Lung Diseases: - Group verbal and written instruction to review new updates, new respiratory medications, new advancements in procedures and treatments. Provide informative websites and "800" numbers of self-education.   Pulmonary Rehab from 03/05/2017 in [facility] Cardiac and Pulmonary Rehab  Date  11/11/16  Educator  LB  Instruction Review Code  2- meets goals/outcomes      Lung Procedures: - Group verbal and written instruction to describe testing methods done to diagnose lung disease. Review the outcome of test results. Describe the treatment choices: Pulmonary Function Tests, ABGs and oximetry.   Energy Conservation: - Provide group verbal and written instruction for methods to conserve energy, plan and organize activities. Instruct on pacing techniques, use of adaptive equipment and posture/positioning to relieve shortness of breath.   Pulmonary Rehab from 03/05/2017 in [facility] Cardiac and Pulmonary Rehab  Date  12/16/16  Educator  [facility]  Instruction Review Code  2- meets goals/outcomes      Triggers: - Group verbal and written instruction to review types of environmental controls: home humidity, furnaces, filters, dust mite/pet prevention, HEPA vacuums. To discuss weather changes, air quality and the benefits of nasal washing.   Pulmonary Rehab from 03/05/2017 in [facility] Cardiac and Pulmonary Rehab  Date  02/10/17  Educator  LB  Instruction Review Code  2- meets goals/outcomes      Exacerbations: - Group verbal and written instruction to provide: warning signs, infection symptoms, calling MD promptly, preventive modes, and value of vaccinations. Review: effective airway clearance, coughing and/or vibration techniques. Create an Sports administrator.   Pulmonary  Rehab from 03/05/2017 in [facility] Cardiac and Pulmonary Rehab  Date  12/02/16  Educator  LB  Instruction Review Code  2- meets goals/outcomes      Oxygen: - Individual and group verbal and written instruction on oxygen therapy. Includes supplement oxygen, available portable oxygen systems, continuous and intermittent flow rates, oxygen safety, concentrators, and Medicare reimbursement for oxygen.   Pulmonary Rehab from 03/05/2017 in [facility] Cardiac and Pulmonary Rehab  Date  10/13/16  Educator  LB  Instruction Review Code  2- meets goals/outcomes      Respiratory Medications: - Group verbal and written instruction to review medications for lung disease. Drug class, frequency, complications, importance of spacers, rinsing mouth after steroid MDI's, and proper cleaning methods for nebulizers.   Pulmonary Rehab from 03/05/2017 in [facility] Cardiac and Pulmonary Rehab  Date  10/13/16  Educator  LB  Instruction Review Code  2- meets goals/outcomes      AED/CPR: - Group  verbal and written instruction with the use of models to demonstrate the basic use of the AED with the basic ABC's of resuscitation.   Pulmonary Rehab from 03/05/2017 in [facility] Cardiac and Pulmonary Rehab  Date  02/12/17  Educator  MA  Instruction Review Code  2- meets goals/outcomes      Breathing Retraining: - Provides individuals verbal and written instruction on purpose, frequency, and proper technique of diaphragmatic breathing and pursed-lipped breathing. Applies individual practice skills.   Pulmonary Rehab from 03/05/2017 in [facility] Cardiac and Pulmonary Rehab  Date  10/13/16  Educator  LB  Instruction Review Code  2- meets goals/outcomes      Anatomy and Physiology of the Lungs: - Group verbal and written instruction with the use of models to provide basic lung anatomy and physiology related to function, structure and complications of lung disease.   Pulmonary Rehab from 03/05/2017 in [facility] Cardiac and Pulmonary Rehab  Date   01/08/17  Educator  LB  Instruction Review Code  2- meets goals/outcomes      Heart Failure: - Group verbal and written instruction on the basics of heart failure: signs/symptoms, treatments, explanation of ejection fraction, enlarged heart and cardiomyopathy.   Pulmonary Rehab from 03/05/2017 in [facility] Cardiac and Pulmonary Rehab  Date  03/05/17  Educator  CE  Instruction Review Code  2- meets goals/outcomes      Sleep Apnea: - Individual verbal and written instruction to review Obstructive Sleep Apnea. Review of risk factors, methods for diagnosing and types of masks and machines for OSA.   Anxiety: - Provides group, verbal and written instruction on the correlation between heart/lung disease and anxiety, treatment options, and management of anxiety.   Pulmonary Rehab from 03/05/2017 in [facility] Cardiac and Pulmonary Rehab  Date  10/28/16  Educator  [facility]  Instruction Review Code  2- Meets goals/outcomes      Relaxation: - Provides group, verbal and written instruction about the benefits of relaxation for patients with heart/lung disease. Also provides patients with examples of relaxation techniques.   Pulmonary Rehab from 03/05/2017 in [facility] Cardiac and Pulmonary Rehab  Date  11/25/16  Educator  [facility]  Instruction Review Code  2- Meets goals/outcomes      Knowledge Questionnaire Score:     Knowledge Questionnaire Score - 03/01/17 1528      Knowledge Questionnaire Score   Post Score 10/10       Core Components/Risk Factors/Patient Goals at Admission:     Personal Goals and Risk Factors at Admission - 10/13/16 1150      Core Components/Risk Factors/Patient Goals on Admission    Weight Management Yes;Weight Loss   Intervention Weight Management: Develop a combined nutrition and exercise program designed to reach desired caloric intake, while maintaining appropriate intake of nutrient and fiber, sodium and fats, and appropriate energy expenditure required for the weight  goal.;Weight Management: Provide education and appropriate resources to help participant work on and attain dietary goals.   Admit Weight 177 lb (80.3 kg)   Goal Weight: Short Term 172 lb (78 kg)   Goal Weight: Long Term 167 lb (75.8 kg)   Expected Outcomes Short Term: Continue to assess and modify interventions until short term weight is achieved;Long Term: Adherence to nutrition and physical activity/exercise program aimed toward attainment of established weight goal;Weight Loss: Understanding of general recommendations for a balanced deficit meal plan, which promotes 1-2 lb weight loss per week and includes a negative energy balance of [phone] kcal/d;Understanding recommendations for meals to include  15-35% energy as protein, 25-35% energy from fat, 35-60% energy from carbohydrates, less than 265m of dietary cholesterol, 20-35 gm of total fiber daily;Understanding of distribution of calorie intake throughout the day with the consumption of 4-5 meals/snacks   Sedentary Yes   Intervention Provide advice, education, support and counseling about physical activity/exercise needs.;Develop an individualized exercise prescription for aerobic and resistive training based on initial evaluation findings, risk stratification, comorbidities and participant's personal goals.   Expected Outcomes Achievement of increased cardiorespiratory fitness and enhanced flexibility, muscular endurance and strength shown through measurements of functional capacity and personal statement of participant.   Increase Strength and Stamina Yes   Intervention Provide advice, education, support and counseling about physical activity/exercise needs.;Develop an individualized exercise prescription for aerobic and resistive training based on initial evaluation findings, risk stratification, comorbidities and participant's personal goals.   Expected Outcomes Achievement of increased cardiorespiratory fitness and enhanced flexibility,  muscular endurance and strength shown through measurements of functional capacity and personal statement of participant.   Improve shortness of breath with ADL's Yes   Intervention Provide education, individualized exercise plan and daily activity instruction to help decrease symptoms of SOB with activities of daily living.   Expected Outcomes Short Term: Achieves a reduction of symptoms when performing activities of daily living.   Develop more efficient breathing techniques such as purse lipped breathing and diaphragmatic breathing; and practicing self-pacing with activity Yes   Intervention Provide education, demonstration and support about specific breathing techniuqes utilized for more efficient breathing. Include techniques such as pursed lipped breathing, diaphragmatic breathing and self-pacing activity.   Expected Outcomes Short Term: Participant will be able to demonstrate and use breathing techniques as needed throughout daily activities.   Increase knowledge of respiratory medications and ability to use respiratory devices properly  Yes  ProAir MDI; oxygen 2-4l/m   Intervention Provide education and demonstration as needed of appropriate use of medications, inhalers, and oxygen therapy.   Expected Outcomes Short Term: Achieves understanding of medications use. Understands that oxygen is a medication prescribed by physician. Demonstrates appropriate use of inhaler and oxygen therapy.   Hypertension Yes   Intervention Provide education on lifestyle modifcations including regular physical activity/exercise, weight management, moderate sodium restriction and increased consumption of fresh fruit, vegetables, and low fat dairy, alcohol moderation, and smoking cessation.;Monitor prescription use compliance.   Expected Outcomes Short Term: Continued assessment and intervention until BP is < 140/979mHG in hypertensive participants. < 130/8030mG in hypertensive participants with diabetes, heart  failure or chronic kidney disease.;Long Term: Maintenance of blood pressure at goal levels.   Lipids Yes   Intervention Provide education and support for participant on nutrition & aerobic/resistive exercise along with prescribed medications to achieve LDL <51m51mDL >40mg79mExpected Outcomes Short Term: Participant states understanding of desired cholesterol values and is compliant with medications prescribed. Participant is following exercise prescription and nutrition guidelines.;Long Term: Cholesterol controlled with medications as prescribed, with individualized exercise RX and with personalized nutrition plan. Value goals: LDL < 51mg,55m > 40 mg.      Core Components/Risk Factors/Patient Goals Review:      Goals and Risk Factor Review    Row Name 10/21/16 1357 10/26/16 1113 11/18/16 1516 12/07/16 1116 01/06/17 1356     Core Components/Risk Factors/Patient Goals Review   Personal Goals Review Develop more efficient breathing techniques such as purse lipped breathing and diaphragmatic breathing and practicing self-pacing with activity. Develop more efficient breathing techniques such as purse lipped breathing and diaphragmatic  breathing and practicing self-pacing with activity.;Increase Strength and Stamina Sedentary;Increase Strength and Stamina;Improve shortness of breath with ADL's;Develop more efficient breathing techniques such as purse lipped breathing and diaphragmatic breathing and practicing self-pacing with activity.;Increase knowledge of respiratory medications and ability to use respiratory devices properly.;Hypertension;Lipids Increase Strength and Stamina;Develop more efficient breathing techniques such as purse lipped breathing and diaphragmatic breathing and practicing self-pacing with activity.;Lipids;Hypertension;Weight Management/Obesity;Improve shortness of breath with ADL's Weight Management/Obesity;Improve shortness of breath with ADL's;Increase knowledge of respiratory  medications and ability to use respiratory devices properly.;Develop more efficient breathing techniques such as purse lipped breathing and diaphragmatic breathing and practicing self-pacing with activity.;Hypertension;Lipids   Review Reviewed pursed lip breathing technique with Rusty today. Patient tolerated his first session well and stated that it didn't bother him the next day. He has been using PLB and stated that it is helping him a little.  Mr Amble has progressed with his exercise goals. He has maintained acceptable O2Sat's with his exercise on 2l/m. Mr Stanco does self-adjust his oxygen liter flow and is very content with the gas E tank for portability. He knows the advantage to continuous flow.  Using PLB and pacing , he is active at his new house and has made a carrier for his O2 tank for his tractor. Today I gave him a spacer for his ProAir with instructions. His BP is acceptable, and he is complaint with his lipid medication.  Patient stated that he has maintained his weight and his energy levels and stamina are about the same. Stated that weather is his major limiting factor with breathing and SOB, but feels comfortable using PLB to mainage SOB. Blood pressure and lipid panels were reported as being good.   Mr Tigges is compliant with his medications, including his MDI's. He has no problems with his lipid level,  and BP have been acceptable in [facility]. His shortness of breath has not improved, but he manages it with PLB and pacing. Mr Cruzan states that the exercise has increased his energy level and plans to continue exercise in our [employer] or [employer].   Expected Outcomes Rusty will become more profieicent at using PLB and will eventually become independent in using PLB without queuing. Stormy Card will attend class regularly and continue to gain increases in strength and stamina. He will continue to use PLB to help control SOB.  Continue progressing with his exercise and  knowledge of self-management of his COPD. Patient will continue to attend [facility] regularly and progress towards these goals.  Continue progressing with his exercise goals and planning exercise after [facility].   Nambe Name 03/01/17 1559             Core Components/Risk Factors/Patient Goals Review   Personal Goals Review Weight Management/Obesity;Improve shortness of breath with ADL's;Increase knowledge of respiratory medications and ability to use respiratory devices properly.;Develop more efficient breathing techniques such as purse lipped breathing and diaphragmatic breathing and practicing self-pacing with activity.;Hypertension;Lipids       Review With graduation a few sessions away, Mr Tench is looking forward to continuing his exercise in the independent gym, [employer]. He manages his COPD with proper use of  his MDI's and oxygen. Using PLB and pacing, Mr Vandermeulen is able to continue his yardwork. He has adapted his tractor to carry his oxygen E cylinder. His blood pressures in [facility] have been acceptable and he has no problem with his lipids.Mr Roylance is commended for his hard work and commitment in [employer].  Expected Outcomes Continue exercising and applying [facility] education for self-management of his COPD.          Core Components/Risk Factors/Patient Goals at Discharge (Final Review):      Goals and Risk Factor Review - 03/01/17 1559      Core Components/Risk Factors/Patient Goals Review   Personal Goals Review Weight Management/Obesity;Improve shortness of breath with ADL's;Increase knowledge of respiratory medications and ability to use respiratory devices properly.;Develop more efficient breathing techniques such as purse lipped breathing and diaphragmatic breathing and practicing self-pacing with activity.;Hypertension;Lipids   Review With graduation a few sessions away, Mr Magan is looking forward to continuing his exercise in the independent  gym, [employer]. He manages his COPD with proper use of  his MDI's and oxygen. Using PLB and pacing, Mr Kina is able to continue his yardwork. He has adapted his tractor to carry his oxygen E cylinder. His blood pressures in [facility] have been acceptable and he has no problem with his lipids.Mr Mozer is commended for his hard work and commitment in [employer].   Expected Outcomes Continue exercising and applying [facility] education for self-management of his COPD.      ITP Comments:     ITP Comments    Row Name 02/01/17 0839 02/22/17 0840         ITP Comments Mr Rosenfield called and has been out from [place] due to illness. He has been treated with prednisone and an antibiotic and hopes to return to [place] soon. 30 day note review with Dr Emily Filbert, Medical Director of [place]         Comments:  Haydan graduated today from cardiac rehab with 36 sessions completed.  Details of the patient's exercise prescription and what He needs to do in order to continue the prescription and progress were discussed with patient.  Patient was given a copy of prescription and goals.  Patient verbalized understanding.  Damani plans to continue to exercise by joining [facility] [employer] [facility] days/week.[name]

## 2017-03-16 ENCOUNTER — Telehealth: Payer: Self-pay | Admitting: Internal Medicine

## 2017-03-16 NOTE — Telephone Encounter (Signed)
Informed Anderson Malta to use weight listed on OV note to calculate Zemaira. Spoke Thomasena Edis in the pharmacy and they are getting the patient setup for Zemaira along with an Epipen. Nothing further needed.

## 2017-03-16 NOTE — Telephone Encounter (Signed)
Anderson Malta from [employer]  Needs a verbal on  patient zemaira  Please call back

## 2017-03-31 ENCOUNTER — Inpatient Hospital Stay
Admission: EM | Admit: 2017-03-31 | Discharge: 2017-04-01 | DRG: 641 | Disposition: A | Discharge status: Home / Self Care | Payer: Medicaid Other | Attending: Internal Medicine | Admitting: Internal Medicine

## 2017-03-31 ENCOUNTER — Emergency Department: Payer: Medicaid Other

## 2017-03-31 ENCOUNTER — Encounter: Payer: Self-pay | Admitting: *Deleted

## 2017-03-31 DIAGNOSIS — Z955 Presence of coronary angioplasty implant and graft: Secondary | ICD-10-CM | POA: Diagnosis not present

## 2017-03-31 DIAGNOSIS — Z79899 Other long term (current) drug therapy: Secondary | ICD-10-CM | POA: Diagnosis not present

## 2017-03-31 DIAGNOSIS — Z87891 Personal history of nicotine dependence: Secondary | ICD-10-CM

## 2017-03-31 DIAGNOSIS — I10 Essential (primary) hypertension: Secondary | ICD-10-CM | POA: Diagnosis present

## 2017-03-31 DIAGNOSIS — E785 Hyperlipidemia, unspecified: Secondary | ICD-10-CM | POA: Diagnosis present

## 2017-03-31 DIAGNOSIS — Z7982 Long term (current) use of aspirin: Secondary | ICD-10-CM

## 2017-03-31 DIAGNOSIS — J8410 Pulmonary fibrosis, unspecified: Secondary | ICD-10-CM | POA: Diagnosis present

## 2017-03-31 DIAGNOSIS — F1011 Alcohol abuse, in remission: Secondary | ICD-10-CM | POA: Diagnosis present

## 2017-03-31 DIAGNOSIS — E876 Hypokalemia: Principal | ICD-10-CM | POA: Diagnosis present

## 2017-03-31 DIAGNOSIS — Z8249 Family history of ischemic heart disease and other diseases of the circulatory system: Secondary | ICD-10-CM | POA: Diagnosis not present

## 2017-03-31 DIAGNOSIS — J449 Chronic obstructive pulmonary disease, unspecified: Secondary | ICD-10-CM | POA: Diagnosis present

## 2017-03-31 DIAGNOSIS — R531 Weakness: Secondary | ICD-10-CM | POA: Diagnosis present

## 2017-03-31 DIAGNOSIS — R9431 Abnormal electrocardiogram [ECG] [EKG]: Secondary | ICD-10-CM | POA: Diagnosis present

## 2017-03-31 DIAGNOSIS — Z7902 Long term (current) use of antithrombotics/antiplatelets: Secondary | ICD-10-CM | POA: Diagnosis not present

## 2017-03-31 DIAGNOSIS — Z9981 Dependence on supplemental oxygen: Secondary | ICD-10-CM

## 2017-03-31 DIAGNOSIS — K219 Gastro-esophageal reflux disease without esophagitis: Secondary | ICD-10-CM | POA: Diagnosis present

## 2017-03-31 DIAGNOSIS — J9611 Chronic respiratory failure with hypoxia: Secondary | ICD-10-CM | POA: Diagnosis present

## 2017-03-31 LAB — BASIC METABOLIC PANEL
Anion gap: 13 (ref 5–15)
BUN: 15 mg/dL (ref 6–20)
CO2: 25 mmol/L (ref 22–32)
CREATININE: 0.86 mg/dL (ref 0.61–1.24)
Calcium: 7.6 mg/dL — ABNORMAL LOW (ref 8.9–10.3)
Chloride: 92 mmol/L — ABNORMAL LOW (ref 101–111)
GFR calc non Af Amer: >60 mL/min (ref >60)
Glucose, Bld: 123 mg/dL — ABNORMAL HIGH (ref 65–99)
POTASSIUM: 2.6 mmol/L — AB (ref 3.5–5.1)
SODIUM: 130 mmol/L — AB (ref 135–145)

## 2017-03-31 LAB — CBC
HEMATOCRIT: 35.2 % — AB (ref 40.0–52.0)
Hemoglobin: 12.1 g/dL — ABNORMAL LOW (ref 13.0–18.0)
MCH: 36.4 pg — AB (ref 26.0–34.0)
MCHC: 34.4 g/dL (ref 32.0–36.0)
MCV: 105.6 fL — AB (ref 80.0–100.0)
PLATELETS: 264 10*3/uL (ref 150–440)
RBC: 3.33 MIL/uL — AB (ref 4.40–5.90)
RDW: 14 % (ref 11.5–14.5)
WBC: 7.7 10*3/uL (ref 3.8–10.6)

## 2017-03-31 LAB — TROPONIN I: Troponin I: <0.03 ng/mL (ref <0.03)

## 2017-03-31 LAB — POTASSIUM: Potassium: 3 mmol/L — ABNORMAL LOW (ref 3.5–5.1)

## 2017-03-31 LAB — MAGNESIUM: Magnesium: 0.5 mg/dL — CL (ref 1.7–2.4)

## 2017-03-31 LAB — PHOSPHORUS: PHOSPHORUS: 3.6 mg/dL (ref 2.5–4.6)

## 2017-03-31 MED ORDER — POTASSIUM CHLORIDE 10 MEQ/100ML IV SOLN
10.0000 meq | Freq: Once | INTRAVENOUS | Status: AC
  Administered 2017-03-31: 10 meq via INTRAVENOUS
  Filled 2017-03-31: qty 100

## 2017-03-31 MED ORDER — ATORVASTATIN CALCIUM 20 MG PO TABS
40.0000 mg | ORAL_TABLET | Freq: Every day | ORAL | Status: DC
  Administered 2017-04-01: 40 mg via ORAL
  Filled 2017-03-31: qty 4

## 2017-03-31 MED ORDER — POTASSIUM CHLORIDE CRYS ER 20 MEQ PO TBCR
40.0000 meq | EXTENDED_RELEASE_TABLET | Freq: Once | ORAL | Status: AC
  Administered 2017-04-01: 40 meq via ORAL
  Filled 2017-03-31: qty 4

## 2017-03-31 MED ORDER — MAGNESIUM SULFATE IN D5W 1-5 GM/100ML-% IV SOLN
1.0000 g | Freq: Once | INTRAVENOUS | Status: AC
  Administered 2017-03-31: 1 g via INTRAVENOUS
  Filled 2017-03-31: qty 100

## 2017-03-31 MED ORDER — LOSARTAN POTASSIUM-HCTZ 100-12.5 MG PO TABS: 1.0000 | ORAL_TABLET | Freq: Every day | ORAL | Status: DC

## 2017-03-31 MED ORDER — POTASSIUM CHLORIDE CRYS ER 20 MEQ PO TBCR
40.0000 meq | EXTENDED_RELEASE_TABLET | Freq: Once | ORAL | Status: AC
  Administered 2017-03-31: 40 meq via ORAL
  Filled 2017-03-31: qty 2

## 2017-03-31 MED ORDER — METOPROLOL SUCCINATE ER 50 MG PO TB24
50.0000 mg | ORAL_TABLET | Freq: Every day | ORAL | Status: DC
  Administered 2017-04-01: 50 mg via ORAL
  Filled 2017-03-31: qty 1

## 2017-03-31 MED ORDER — MAGNESIUM SULFATE 4 GM/100ML IV SOLN
4.0000 g | Freq: Once | INTRAVENOUS | Status: AC
  Administered 2017-03-31: 4 g via INTRAVENOUS

## 2017-03-31 MED ORDER — ALBUTEROL SULFATE (2.5 MG/3ML) 0.083% IN NEBU: 3.0000 mL | INHALATION_SOLUTION | Freq: Four times a day (QID) | RESPIRATORY_TRACT | Status: DC | PRN

## 2017-03-31 MED ORDER — ACETAMINOPHEN 650 MG RE SUPP
650.0000 mg | Freq: Four times a day (QID) | RECTAL | Status: DC | PRN
Start: 2017-03-31 — End: 2017-04-01

## 2017-03-31 MED ORDER — ONDANSETRON HCL 4 MG/2ML IJ SOLN: 4.0000 mg | Freq: Four times a day (QID) | INTRAMUSCULAR | Status: DC | PRN

## 2017-03-31 MED ORDER — ENOXAPARIN SODIUM 40 MG/0.4ML ~~LOC~~ SOLN
40.0000 mg | SUBCUTANEOUS | Status: DC
  Administered 2017-03-31: 40 mg via SUBCUTANEOUS
  Filled 2017-03-31: qty 0.4

## 2017-03-31 MED ORDER — LORATADINE 10 MG PO TABS
10.0000 mg | ORAL_TABLET | Freq: Every day | ORAL | Status: DC
  Administered 2017-04-01: 10 mg via ORAL
  Filled 2017-03-31: qty 1

## 2017-03-31 MED ORDER — ADULT MULTIVITAMIN W/MINERALS CH
1.0000 | ORAL_TABLET | Freq: Every day | ORAL | Status: DC
  Administered 2017-04-01: 1 via ORAL
  Filled 2017-03-31 (×2): qty 1

## 2017-03-31 MED ORDER — POLYETHYLENE GLYCOL 3350 17 G PO PACK: 17.0000 g | PACK | Freq: Every day | ORAL | Status: DC | PRN

## 2017-03-31 MED ORDER — CALCIUM CARBONATE-VITAMIN D 500-200 MG-UNIT PO TABS: 2.0000 | ORAL_TABLET | Freq: Every day | ORAL | Status: DC

## 2017-03-31 MED ORDER — AMLODIPINE BESYLATE 10 MG PO TABS
10.0000 mg | ORAL_TABLET | Freq: Every day | ORAL | Status: DC
  Administered 2017-04-01: 10 mg via ORAL
  Filled 2017-03-31: qty 1

## 2017-03-31 MED ORDER — MAGNESIUM SULFATE 4 GM/100ML IV SOLN
INTRAVENOUS | Status: AC
  Filled 2017-03-31: qty 100

## 2017-03-31 MED ORDER — ACETAMINOPHEN 325 MG PO TABS
ORAL_TABLET | ORAL | Status: AC
  Filled 2017-03-31: qty 2

## 2017-03-31 MED ORDER — ONDANSETRON HCL 4 MG PO TABS: 4.0000 mg | ORAL_TABLET | Freq: Four times a day (QID) | ORAL | Status: DC | PRN

## 2017-03-31 MED ORDER — ACETAMINOPHEN 325 MG PO TABS
650.0000 mg | ORAL_TABLET | Freq: Four times a day (QID) | ORAL | Status: DC | PRN
  Administered 2017-03-31 – 2017-04-01 (×3): 650 mg via ORAL
  Filled 2017-03-31 (×2): qty 2

## 2017-03-31 MED ORDER — POTASSIUM CHLORIDE 20 MEQ PO PACK
PACK | ORAL | Status: AC
  Filled 2017-03-31: qty 2

## 2017-03-31 MED ORDER — CLOPIDOGREL BISULFATE 75 MG PO TABS
75.0000 mg | ORAL_TABLET | Freq: Every day | ORAL | Status: DC
  Administered 2017-04-01: 75 mg via ORAL
  Filled 2017-03-31: qty 1

## 2017-03-31 MED ORDER — LOSARTAN POTASSIUM 50 MG PO TABS
100.0000 mg | ORAL_TABLET | Freq: Every day | ORAL | Status: DC
  Administered 2017-04-01: 100 mg via ORAL
  Filled 2017-03-31: qty 2

## 2017-03-31 MED ORDER — POTASSIUM CHLORIDE 20 MEQ PO PACK
40.0000 meq | PACK | Freq: Once | ORAL | Status: AC
  Administered 2017-03-31: 40 meq via ORAL

## 2017-03-31 MED ORDER — UMECLIDINIUM-VILANTEROL 62.5-25 MCG/INH IN AEPB
1.0000 | INHALATION_SPRAY | Freq: Every day | RESPIRATORY_TRACT | Status: DC
  Administered 2017-04-01: 1 via RESPIRATORY_TRACT
  Filled 2017-03-31: qty 14

## 2017-03-31 MED ORDER — PANTOPRAZOLE SODIUM 40 MG PO TBEC
40.0000 mg | DELAYED_RELEASE_TABLET | Freq: Two times a day (BID) | ORAL | Status: DC
  Administered 2017-03-31 – 2017-04-01 (×2): 40 mg via ORAL
  Filled 2017-03-31 (×2): qty 1

## 2017-03-31 MED ORDER — HYDROCHLOROTHIAZIDE 12.5 MG PO CAPS
12.5000 mg | ORAL_CAPSULE | Freq: Every day | ORAL | Status: DC
  Administered 2017-04-01: 12.5 mg via ORAL
  Filled 2017-03-31: qty 1

## 2017-03-31 MED ORDER — ASPIRIN EC 81 MG PO TBEC
81.0000 mg | DELAYED_RELEASE_TABLET | Freq: Every day | ORAL | Status: DC
  Administered 2017-04-01: 81 mg via ORAL
  Filled 2017-03-31: qty 1

## 2017-03-31 NOTE — H&P (Signed)
[facility] NAME: Austin Patterson    MR#:  [id number]  DATE OF BIRTH:  Aug 03, 1956  DATE OF ADMISSION:  03/31/2017  PRIMARY CARE PHYSICIAN: Lavonne Chick, MD   REQUESTING/REFERRING PHYSICIAN: Dr Jacqualine Code  CHIEF COMPLAINT:  Generalized weakness and leg weakness  HISTORY OF PRESENT ILLNESS:  Austin Patterson  is a 61 y.o. male with a known history of Chronic respiratory failure on home oxygen chronic, interstitial bilateral pulmonary fibrosis who has recently been started on IV Zemaira once a week as outpatient which was initiated by pulmonologist, hypertension comes to the emergency room with generalized weakness and fatigue with malaise. Patient was found to have potassium of 2.6 and magnesium of 0.5. He has history of alcohol abuse however he denies any more alcohol consumption. Patient is being admitted with electrolyte abnormality for IV and oral replacement.  PAST MEDICAL HISTORY:   Past Medical History:  Diagnosis Date  . COPD (chronic obstructive pulmonary disease) ([place])   . Hyperlipidemia   . Hypertension   . Pulmonary fibrosis ([place]) 11/2015    PAST SURGICAL HISTOIRY:   Past Surgical History:  Procedure Laterality Date  . CORONARY STENT PLACEMENT    . ESOPHAGOGASTRODUODENOSCOPY (EGD) WITH PROPOFOL N/A 09/23/2016   Procedure: ESOPHAGOGASTRODUODENOSCOPY (EGD) WITH PROPOFOL;  Surgeon: [name], MD;  Location: [facility] ENDOSCOPY;  Service: Endoscopy;  Laterality: N/A;  . SHOULDER ACROMIOPLASTY      SOCIAL HISTORY:   Social History  Substance Use Topics  . Smoking status: Former Smoker    Quit date: 10/14/2015  . Smokeless tobacco: Former Systems developer  . Alcohol use 16.8 oz/week    28 Cans of beer per week     Comment: 6 12 oz cans of beer/day    FAMILY HISTORY:   Family History  Problem Relation Age of Onset  . Heart disease Mother     DRUG ALLERGIES:  No Known Allergies  REVIEW OF SYSTEMS:  Review of  Systems  Constitutional: Negative for chills, fever and weight loss.  HENT: Negative for ear discharge, ear pain and nosebleeds.   Eyes: Negative for blurred vision, pain and discharge.  Respiratory: Positive for shortness of breath. Negative for sputum production, wheezing and stridor.   Cardiovascular: Negative for chest pain, palpitations, orthopnea and PND.  Gastrointestinal: Negative for abdominal pain, diarrhea, nausea and vomiting.  Genitourinary: Negative for frequency and urgency.  Musculoskeletal: Negative for back pain and joint pain.  Neurological: Positive for weakness. Negative for sensory change, speech change and focal weakness.  Psychiatric/Behavioral: Negative for depression and hallucinations. The patient is not nervous/anxious.      MEDICATIONS AT HOME:   Prior to Admission medications   Medication Sig Start Date End Date Taking? Authorizing Provider  albuterol (PROVENTIL HFA;VENTOLIN HFA) 108 (90 Base) MCG/ACT inhaler Inhale 2 puffs into the lungs every 6 (six) hours as needed for wheezing or shortness of breath. 01/29/17   Darel Hong, MD  amLODipine (NORVASC) 10 MG tablet Take 1 tablet (10 mg total) by mouth daily. 08/19/16   Tawni Millers, MD  aspirin EC 81 MG tablet Take 1 tablet (81 mg total) by mouth daily. 08/19/16   Tawni Millers, MD  atorvastatin (LIPITOR) 40 MG tablet Take 1 tablet (40 mg total) by mouth at bedtime. 08/19/16   Tawni Millers, MD  calcium-vitamin D (OSCAL WITH D) 500-200 MG-UNIT tablet Take 2 tablets by mouth daily with breakfast. 09/24/16   Fritzi Mandes, MD  cetirizine (  ZYRTEC) 10 MG tablet Take 10 mg by mouth at bedtime. 08/19/16   [provider]  clopidogrel (PLAVIX) 75 MG tablet Take 1 tablet (75 mg total) by mouth daily. 08/19/16   Tawni Millers, MD  losartan-hydrochlorothiazide (HYZAAR) 100-12.5 MG tablet Take 1 tablet by mouth daily. 08/19/16   Tawni Millers, MD  metoprolol succinate (TOPROL-XL) 50 MG 24 hr tablet Take 1 tablet  (50 mg total) by mouth daily. 08/19/16   Tawni Millers, MD  Multiple Vitamin (MULTIVITAMIN WITH MINERALS) TABS tablet Take 1 tablet by mouth daily. 09/24/16   Fritzi Mandes, MD  pantoprazole (PROTONIX) 40 MG tablet Take 1 tablet (40 mg total) by mouth 2 (two) times daily. 09/23/16   Fritzi Mandes, MD  Spacer/Aero Chamber Mouthpiece MISC 1 each by Does not apply route every 4 (four) hours as needed (wheezing). 01/29/17   Darel Hong, MD  umeclidinium-vilanterol (ANORO ELLIPTA) 62.5-25 MCG/INH AEPB INHALE ONE PUFF INTO THE LUNGS DAILY 01/07/17   Flora Lipps, MD      VITAL SIGNS:  Blood pressure (!) 161/98, pulse 87, temperature 98.2 F (36.8 C), temperature source Oral, resp. rate 17, height 5\' 7"  (1.702 m), weight 83.9 kg (185 lb), SpO2 95 %.  PHYSICAL EXAMINATION:  GENERAL:  61 y.o.-year-old patient lying in the bed with no acute distress.  EYES: Pupils equal, round, reactive to light and accommodation. No scleral icterus. Extraocular muscles intact.  HEENT: Head atraumatic, normocephalic. Oropharynx and nasopharynx clear.  NECK:  Supple, no jugular venous distention. No thyroid enlargement, no tenderness.  LUNGS: Normal breath sounds bilaterally, no wheezing, rales,rhonchi or crepitation. No use of accessory muscles of respiration.  CARDIOVASCULAR: S1, S2 normal. No murmurs, rubs, or gallops.  ABDOMEN: Soft, nontender, nondistended. Bowel sounds present. No organomegaly or mass.  EXTREMITIES: No pedal edema, cyanosis, or clubbing.  NEUROLOGIC: Cranial nerves II through XII are intact. Muscle strength 5/5 in all extremities. Sensation intact. Gait not checked.  PSYCHIATRIC: The patient is alert and oriented x 3.  SKIN: No obvious rash, lesion, or ulcer.   LABORATORY PANEL:   CBC  Recent Labs Lab 03/31/17 1404  WBC 7.7  HGB 12.1*  HCT 35.2*  PLT 264   ------------------------------------------------------------------------------------------------------------------  Chemistries    Recent Labs Lab 03/31/17 1404  NA 130*  K 2.6*  CL 92*  CO2 25  GLUCOSE 123*  BUN 15  CREATININE 0.86  CALCIUM 7.6*  MG 0.5*   ------------------------------------------------------------------------------------------------------------------  Cardiac Enzymes  Recent Labs Lab 03/31/17 1404  TROPONINI <0.03   ------------------------------------------------------------------------------------------------------------------  RADIOLOGY:  Dg Chest 2 View  Result Date: 03/31/2017 CLINICAL DATA:  61 year old male with chest and leg pain. EXAM: CHEST  2 VIEW COMPARISON:  01/29/2017 and earlier. 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 Stable lung volumes. Mediastinal contours remain normal. No pneumothorax, pulmonary edema, pleural effusion or acute pulmonary opacity. Calcified aortic atherosclerosis. Osteopenia. No acute osseous abnormality identified. Negative visible bowel gas pattern. IMPRESSION: Chronic lung disease.  No acute cardiopulmonary abnormality. Calcified aortic atherosclerosis. Electronically Signed   By: Genevie Ann M.D.   On: 03/31/2017 14:37    EKG:    IMPRESSION AND PLAN:    Endrit Gittins  is a 61 y.o. male with a known history of Chronic respiratory failure on home oxygen chronic, interstitial bilateral pulmonary fibrosis who has recently been started on IV Zemaira once a week as outpatient which was initiated by pulmonologist, hypertension comes to the emergency room  with generalized weakness and fatigue with malaise. Patient was found to have potassium of 2.6 and magnesium of 0.5.   1. Generalized weakness/malaise/fatigue secondary to severe electrolyte abnormality with hypokalemia and  Hypomagnesemia -Admit to medical floor -Pharmacy consult for IV and oral electrolyte replacement -Monitor K and magnesium -Check phosphorus  2. Chronic interstitialpulmonary fibrosis/chronic  respiratory failure on home oxygen -Follows with [facility] pulmonary -Patient recently started on IV Zemaira infusion once a week at home -continue oxygen and when necessary nebs  3. Hypertension continue home meds  4. DVT prophylaxis Lovenox   All the records are reviewed and case discussed with ED provider. Management plans discussed with the patient, family and they are in agreement.  CODE STATUS: Full  TOTAL TIME TAKING CARE OF THIS PATIENT: 50 minutes.    Tellis Spivak M.D on 03/31/2017 at 4:58 PM  Between 7am to 6pm - Pager - [phone]  After 6pm go to [email or web address] - password [email or web address] [facility]  [facility]  Office  [phone]  CC: Primary care physician; Lavonne Chick, MD

## 2017-03-31 NOTE — Progress Notes (Signed)
Spoke with Dr. Fritzi Mandes about placing patient on 2A, she stated they were replacing his potassium and he would not need cardiac monitoring.

## 2017-03-31 NOTE — ED Triage Notes (Addendum)
Pt states bilateral leg pain in his calves, chest pain and dizziness, states pain has gotten worse over the past few days, denies any swelling in his lower extremities, hx of pulmonary fibrosis on 2L [place] chronically, denies any cough

## 2017-03-31 NOTE — ED Notes (Signed)
Patient given dinner tray.

## 2017-03-31 NOTE — Progress Notes (Signed)
PHARMACY CONSULT NOTE - INITIAL   Pharmacy Consult for electrolyte monitoring   No Known Allergies  Patient Measurements: Height: 5\' 7"  (170.2 cm) Weight: 177 lb 11.2 oz (80.6 kg) IBW/kg (Calculated) : 66.1  Labs:  Recent Labs  03/31/17 1404  WBC 7.7  HGB 12.1*  HCT 35.2*  PLT 264  CREATININE 0.86  MG 0.5*  PHOS 3.6   Estimated Creatinine Clearance: 91.7 mL/min (by C-G formula based on SCr of 0.86 mg/dL).  Lab Results  Component Value Date   K 3.0 (L) 03/31/2017    Assessment: 61 yo male with hypokalemia and Hypomagnesemia. Pharmacy consulted for electrolyte management.   K: 2.6 Mg: 0.5 Phos:3.6  Goal of Therapy:  K: 3.5-5 Mg: 1.9-2.4 Phos: 2.5-4.6  Plan:  Patient ordered KCL19mEq PO and KCL 45mEq IV x 1  And Magnesium 1g IV x 1 in ED.   Will order additional KCL 23mEq PO X1 and Magnesium 4g IV x 1. Recheck K+ at 2200 tonight. Recheck all electrolytes with AM labs.   6/13 22:00 K+ 3.0. 40 mEq KCl PO x1 ordered. Labs in AM.  Eloise Harman, PharmD, BCPS Clinical Pharmacist 03/31/2017 11:31 PM

## 2017-03-31 NOTE — ED Provider Notes (Signed)
[facility] Emergency Department Provider Note   ____________________________________________   First MD Initiated Contact with Patient 03/31/17 1535     (approximate)  I have reviewed the triage vital signs and the nursing notes.   HISTORY  Chief Complaint Chest Pain; Leg Pain; and Dizziness    HPI Austin Patterson is a 61 y.o. male here for evaluation of feeling increasingly fatigued, having aching discomfort in his calves, and some chest discomfort over the last 2 days.  Patient reports she started a home infusion Monday for pulmonary fibrosis. The couple days ago began extremity has slight discomfort in his chest, somewhat hard to describe, but reports his muscles in his whole body feel very weak and tired. He is also beencramping in his calves.  No change in medications aside from his new infusion which is ordered by his pulmonologist. He does report that he has chronic shortness of breath, but has not noticed any changes in this   Past Medical History:  Diagnosis Date  . COPD (chronic obstructive pulmonary disease) ([place])   . Hyperlipidemia   . Hypertension   . Pulmonary fibrosis ([place]) 11/2015    Patient Active Problem List   Diagnosis Date Noted  . Hypomagnesemia 03/31/2017  . Hiatal hernia   . Reflux esophagitis   . Hematemesis without nausea   . Hyponatremia 09/21/2016  . Pneumonia 12/09/2015  . COPD with acute exacerbation ([place]) 10/14/2015    Past Surgical History:  Procedure Laterality Date  . CORONARY STENT PLACEMENT    . ESOPHAGOGASTRODUODENOSCOPY (EGD) WITH PROPOFOL N/A 09/23/2016   Procedure: ESOPHAGOGASTRODUODENOSCOPY (EGD) WITH PROPOFOL;  Surgeon: [name], MD;  Location: [facility] ENDOSCOPY;  Service: Endoscopy;  Laterality: N/A;  . SHOULDER ACROMIOPLASTY      Prior to Admission medications   Medication Sig Start Date End Date Taking? Authorizing Provider  amLODipine (NORVASC) 10 MG tablet Take 1 tablet (10 mg total) by  mouth daily. 08/19/16  Yes Tawni Millers, MD  aspirin EC 81 MG tablet Take 1 tablet (81 mg total) by mouth daily. 08/19/16  Yes Tawni Millers, MD  atorvastatin (LIPITOR) 40 MG tablet Take 1 tablet (40 mg total) by mouth at bedtime. 08/19/16  Yes Tawni Millers, MD  calcium-vitamin D (OSCAL WITH D) 500-200 MG-UNIT tablet Take 2 tablets by mouth daily with breakfast. 09/24/16  Yes Fritzi Mandes, MD  cetirizine (ZYRTEC) 10 MG tablet Take 10 mg by mouth at bedtime. 08/19/16  Yes [provider]  clopidogrel (PLAVIX) 75 MG tablet Take 1 tablet (75 mg total) by mouth daily. 08/19/16  Yes Tawni Millers, MD  losartan-hydrochlorothiazide (HYZAAR) 100-12.5 MG tablet Take 1 tablet by mouth daily. 08/19/16  Yes Tawni Millers, MD  metoprolol succinate (TOPROL-XL) 50 MG 24 hr tablet Take 1 tablet (50 mg total) by mouth daily. 08/19/16  Yes Tawni Millers, MD  Multiple Vitamin (MULTIVITAMIN WITH MINERALS) TABS tablet Take 1 tablet by mouth daily. 09/24/16  Yes Fritzi Mandes, MD  pantoprazole (PROTONIX) 40 MG tablet Take 1 tablet (40 mg total) by mouth 2 (two) times daily. 09/23/16  Yes Fritzi Mandes, MD  albuterol (PROVENTIL HFA;VENTOLIN HFA) 108 (90 Base) MCG/ACT inhaler Inhale 2 puffs into the lungs every 6 (six) hours as needed for wheezing or shortness of breath. 01/29/17   Darel Hong, MD  Spacer/Aero Chamber Mouthpiece MISC 1 each by Does not apply route every 4 (four) hours as needed (wheezing). 01/29/17   Darel Hong, MD  umeclidinium-vilanterol [facility] ELLIPTA)  62.5-25 MCG/INH AEPB INHALE ONE PUFF INTO THE LUNGS DAILY 01/07/17   Flora Lipps, MD    Allergies Patient has no known allergies.  Family History  Problem Relation Age of Onset  . Heart disease Mother     Social History Social History  Substance Use Topics  . Smoking status: Former Smoker    Quit date: 10/14/2015  . Smokeless tobacco: Former Systems developer  . Alcohol use 16.8 oz/week    28 Cans of beer per week     Comment: 6 12 oz cans of  beer/day    Review of Systems Constitutional: No fever/chills Eyes: No visual changes. ENT: No sore throat. Cardiovascular: See history of present illness Respiratory: Denies shortness of breath. Gastrointestinal: No abdominal pain.  No nausea, no vomiting.  No diarrhea.  No constipation. Genitourinary: Negative for dysuria. Musculoskeletal: Negative for back pain. Muscles just feel very weak and tired. Achy and crampy discomfort in the lower thighs Skin: Negative for rash. Neurological: Negative for headaches, focal weakness or numbness.    ____________________________________________   PHYSICAL EXAM:  VITAL SIGNS: ED Triage Vitals  Enc Vitals Group     BP 03/31/17 1409 (!) 132/99     Pulse Rate 03/31/17 1409 99     Resp 03/31/17 1409 18     Temp 03/31/17 1409 98.2 F (36.8 C)     Temp Source 03/31/17 1409 Oral     SpO2 03/31/17 1409 96 %     Weight 03/31/17 1406 185 lb (83.9 kg)     Height 03/31/17 1406 5\' 7"  (1.702 m)     Head Circumference --      Peak Flow --      Pain Score 03/31/17 1405 9     Pain Loc --      Pain Edu? --      Excl. in [place]? --     Constitutional: Alert and oriented. Well appearing and in no acute distress. Eyes: Conjunctivae are normal. Head: Atraumatic. Nose: No congestion/rhinnorhea. Mouth/Throat: Mucous membranes are moist. Neck: No stridor.   Cardiovascular: Normal rate, regular rhythm. Grossly normal heart sounds.  Good peripheral circulation. Respiratory: Dry crackles in the bases, slightly prolonged expiratory phase. No wheezing. He speaks in full and clear sentences. Reports on his normal oxygen level he feels fine at present with regard to any shortness of breath Gastrointestinal: Soft and nontender. No distention. Musculoskeletal: No lower extremity tenderness nor edema. Slight thigh discomfort bilaterally. No edema. No venous cords or congestion Neurologic:  Normal speech and language. No gross focal neurologic deficits are  appreciated.  Skin:  Skin is warm, dry and intact. No rash noted. Psychiatric: Mood and affect are normal. Speech and behavior are normal.  ____________________________________________   LABS (all labs ordered are listed, but only abnormal results are displayed)  Labs Reviewed  BASIC METABOLIC PANEL - Abnormal; Notable for the following:       Result Value   Sodium 130 (*)    Potassium 2.6 (*)    Chloride 92 (*)    Glucose, Bld 123 (*)    Calcium 7.6 (*)    All other components within normal limits  CBC - Abnormal; Notable for the following:    RBC 3.33 (*)    Hemoglobin 12.1 (*)    HCT 35.2 (*)    MCV 105.6 (*)    MCH 36.4 (*)    All other components within normal limits  MAGNESIUM - Abnormal; Notable for the following:    Magnesium 0.5 (*)  All other components within normal limits  TROPONIN I  PHOSPHORUS  POTASSIUM  BASIC METABOLIC PANEL  MAGNESIUM   ____________________________________________  EKG  Reviewed and interpreted by me at 1405 Ventricular rate 95 PR 150 QRS 80 QTc 455 Normal sinus rhythm, minimal, slightly nonspecific T-wave abnormality seen in inferolateral distribution, particularly seems to be some slight downward can cavity of the T waves in this distribution which is fairly nonspecific but could be representative of ischemia or electrolyte type abnormality ____________________________________________  RADIOLOGY  Dg Chest 2 View  Result Date: 03/31/2017 CLINICAL DATA:  61 year old male with chest and leg pain. EXAM: CHEST  2 VIEW COMPARISON:  01/29/2017 and earlier. 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 Stable lung volumes. Mediastinal contours remain normal. No pneumothorax, pulmonary edema, pleural effusion or acute pulmonary opacity. Calcified aortic atherosclerosis. Osteopenia. No acute osseous abnormality identified. Negative visible bowel gas pattern.  IMPRESSION: Chronic lung disease.  No acute cardiopulmonary abnormality. Calcified aortic atherosclerosis. Electronically Signed   By: Genevie Ann M.D.   On: 03/31/2017 14:37    ____________________________________________   PROCEDURES  Procedure(s) performed: None  Procedures  Critical Care performed: No  ____________________________________________   INITIAL IMPRESSION / ASSESSMENT AND PLAN / ED COURSE  Pertinent labs & imaging results that were available during my care of the patient were reviewed by me and considered in my medical decision making (see chart for details).  Patient is for fatigue. Slight chest discomfort, but primarily reports his muscles feel very weak and is having cramping discomfort in his lower thighs. EKG shows nonspecific changes. Blood work does show significant hypokalemia, which given his clinical symptomatology, I suspect this is the primary driver of his symptoms. Denies any new respiratory discomfort  Given the patient's hypokalemia with associated EKG nonspecific abnormality, we will admit him for further treatment of hypokalemia. Patient agreeable.      ____________________________________________   FINAL CLINICAL IMPRESSION(S) / ED DIAGNOSES  Final diagnoses:  Hypokalemia  Weakness  EKG abnormalities      NEW MEDICATIONS STARTED DURING THIS VISIT:  New Prescriptions   No medications on file     Note:  This document was prepared using Dragon voice recognition software and may include unintentional dictation errors.     Delman Kitten, MD 03/31/17 [phone]

## 2017-03-31 NOTE — ED Notes (Signed)
Attempted to call report to floor. Unable to take report at this time.

## 2017-03-31 NOTE — Progress Notes (Signed)
PHARMACY CONSULT NOTE - INITIAL   Pharmacy Consult for electrolyte monitoring   No Known Allergies  Patient Measurements: Height: 5\' 7"  (170.2 cm) Weight: 185 lb (83.9 kg) IBW/kg (Calculated) : 66.1  Labs:  Recent Labs  03/31/17 1404  WBC 7.7  HGB 12.1*  HCT 35.2*  PLT 264  CREATININE 0.86  MG 0.5*  PHOS 3.6   Estimated Creatinine Clearance: 93.4 mL/min (by C-G formula based on SCr of 0.86 mg/dL).  Lab Results  Component Value Date   K 2.6 (LL) 03/31/2017    Assessment: 61 yo male with hypokalemia and Hypomagnesemia. Pharmacy consulted for electrolyte management.   K: 2.6 Mg: 0.5 Phos:3.6  Goal of Therapy:  K: 3.5-5 Mg: 1.9-2.4 Phos: 2.5-4.6  Plan:  Patient ordered KCL16mEq PO and KCL 36mEq IV x 1  And Magnesium 1g IV x 1 in ED.   Will order additional KCL 69mEq PO X1 and Magnesium 4g IV x 1. Recheck K+ at 2200 tonight. Recheck all electrolytes with AM labs.   Pernell Dupre, PharmD, BCPS Clinical Pharmacist 03/31/2017 6:38 PM

## 2017-04-01 LAB — BASIC METABOLIC PANEL
ANION GAP: 6 (ref 5–15)
BUN: 16 mg/dL (ref 6–20)
CHLORIDE: 101 mmol/L (ref 101–111)
CO2: 28 mmol/L (ref 22–32)
Calcium: 7.7 mg/dL — ABNORMAL LOW (ref 8.9–10.3)
Creatinine, Ser: 0.75 mg/dL (ref 0.61–1.24)
GFR calc Af Amer: >60 mL/min (ref >60)
GLUCOSE: 100 mg/dL — AB (ref 65–99)
POTASSIUM: 3.4 mmol/L — AB (ref 3.5–5.1)
SODIUM: 135 mmol/L (ref 135–145)

## 2017-04-01 LAB — MAGNESIUM: MAGNESIUM: 2.1 mg/dL (ref 1.7–2.4)

## 2017-04-01 MED ORDER — POTASSIUM CHLORIDE CRYS ER 20 MEQ PO TBCR
20.0000 meq | EXTENDED_RELEASE_TABLET | Freq: Once | ORAL | Status: AC
  Administered 2017-04-01: 20 meq via ORAL
  Filled 2017-04-01: qty 1

## 2017-04-01 NOTE — Progress Notes (Signed)
Discharge summary reviewed with verbal understanding. Answered all questions. Follow up MD appt reviewed. Escorted to personal vehicle, observed patient attach nasal cannula to personal oxygen tank on 2L of delivery.

## 2017-04-01 NOTE — Discharge Summary (Signed)
[facility] [place] NAME: Austin Patterson    MR#:  [id number]  DATE OF BIRTH:  10-03-1956  DATE OF ADMISSION:  03/31/2017 ADMITTING PHYSICIAN: Fritzi Mandes, MD  DATE OF DISCHARGE: 04/01/2017  PRIMARY CARE PHYSICIAN: Lavonne Chick, MD    ADMISSION DIAGNOSIS:  Hypokalemia [E87.6] Weakness [R53.1] EKG abnormalities [R94.31]  DISCHARGE DIAGNOSIS:  Active Problems:   Hypomagnesemia   SECONDARY DIAGNOSIS:   Past Medical History:  Diagnosis Date  . COPD (chronic obstructive pulmonary disease) ([place])   . Hyperlipidemia   . Hypertension   . Pulmonary fibrosis ([place]) 11/2015    HOSPITAL COURSE:   61 year old male with a history of chronic respiratory failure on 2 L of oxygen and pulmonary fibrosis currently receiving weekly transfusion who presented with generalized weakness and found to have electrolyte abnormalities.   1. Electrolyte abnormalities including hypokalemia and hypo-magnesium due to poor by mouth intake Patient's potassium and magnesium level have improved with repletion. He was advised about diet.  2. Chronic hypoxic respiratory failure with chronic oxygen and chronic interstitial pulmonary fibrosis: Patient will continue weekly transfusion as prescribed by his pulmonologist.  3. Essential hypertension: Continue Norvasc and Hyzaar and metoprolol.  4. GERD: Continue PPI  5. Hyperlipidemia: Continue atorvastatin   DISCHARGE CONDITIONS AND DIET:   Stable for discharge on heart healthy diet  CONSULTS OBTAINED:    DRUG ALLERGIES:  No Known Allergies  DISCHARGE MEDICATIONS:   Current Discharge Medication List    CONTINUE these medications which have NOT CHANGED   Details  amLODipine (NORVASC) 10 MG tablet Take 1 tablet (10 mg total) by mouth daily. Qty: 90 tablet, Refills: 3    aspirin EC 81 MG tablet Take 1 tablet (81 mg total) by mouth daily. Qty: 90 tablet, Refills: 3    atorvastatin (LIPITOR) 40 MG  tablet Take 1 tablet (40 mg total) by mouth at bedtime. Qty: 90 tablet, Refills: 3    calcium-vitamin D (OSCAL WITH D) 500-200 MG-UNIT tablet Take 2 tablets by mouth daily with breakfast. Qty: 60 tablet, Refills: 0    cetirizine (ZYRTEC) 10 MG tablet Take 10 mg by mouth at bedtime.    clopidogrel (PLAVIX) 75 MG tablet Take 1 tablet (75 mg total) by mouth daily. Qty: 90 tablet, Refills: 3    losartan-hydrochlorothiazide (HYZAAR) 100-12.5 MG tablet Take 1 tablet by mouth daily. Qty: 90 tablet, Refills: 3    metoprolol succinate (TOPROL-XL) 50 MG 24 hr tablet Take 1 tablet (50 mg total) by mouth daily. Qty: 90 tablet, Refills: 3    Multiple Vitamin (MULTIVITAMIN WITH MINERALS) TABS tablet Take 1 tablet by mouth daily. Qty: 30 tablet, Refills: 0    pantoprazole (PROTONIX) 40 MG tablet Take 1 tablet (40 mg total) by mouth 2 (two) times daily. Qty: 90 tablet, Refills: 3    albuterol (PROVENTIL HFA;VENTOLIN HFA) 108 (90 Base) MCG/ACT inhaler Inhale 2 puffs into the lungs every 6 (six) hours as needed for wheezing or shortness of breath. Qty: 1 Inhaler, Refills: 2    Spacer/Aero Chamber Mouthpiece MISC 1 each by Does not apply route every 4 (four) hours as needed (wheezing). Qty: 1 each, Refills: 0    umeclidinium-vilanterol (ANORO ELLIPTA) 62.5-25 MCG/INH AEPB INHALE ONE PUFF INTO THE LUNGS DAILY Qty: 30 each, Refills: 11          Today   CHIEF COMPLAINT:  Patient with marked improvement. Denies weakness or shortness of breath.   VITAL SIGNS:  Blood pressure [name])  151/84, pulse 87, temperature 98 F (36.7 C), temperature source Oral, resp. rate 18, height 5\' 7"  (1.702 m), weight 80.6 kg (177 lb 11.2 oz), SpO2 99 %.   REVIEW OF SYSTEMS:  Review of Systems  Constitutional: Negative.  Negative for chills, fever and malaise/fatigue.  HENT: Negative.  Negative for ear discharge, ear pain, hearing loss, nosebleeds and sore throat.   Eyes: Negative.  Negative for blurred vision  and pain.  Respiratory: Negative.  Negative for cough, hemoptysis, shortness of breath and wheezing.   Cardiovascular: Negative.  Negative for chest pain, palpitations and leg swelling.  Gastrointestinal: Negative.  Negative for abdominal pain, blood in stool, diarrhea, nausea and vomiting.  Genitourinary: Negative.  Negative for dysuria.  Musculoskeletal: Negative.  Negative for back pain.  Skin: Negative.   Neurological: Negative for dizziness, tremors, speech change, focal weakness, seizures and headaches.  Endo/Heme/Allergies: Negative.  Does not bruise/bleed easily.  Psychiatric/Behavioral: Negative.  Negative for depression, hallucinations and suicidal ideas.     PHYSICAL EXAMINATION:  GENERAL:  61 y.o.-year-old patient lying in the bed with no acute distress.  NECK:  Supple, no jugular venous distention. No thyroid enlargement, no tenderness.  LUNGS: Normal breath sounds bilaterally, no wheezing, rales,rhonchi  No use of accessory muscles of respiration.  CARDIOVASCULAR: S1, S2 normal. No murmurs, rubs, or gallops.  ABDOMEN: Soft, non-tender, non-distended. Bowel sounds present. No organomegaly or mass.  EXTREMITIES: No pedal edema, cyanosis, or clubbing.  PSYCHIATRIC: The patient is alert and oriented x 3.  SKIN: No obvious rash, lesion, or ulcer.   DATA REVIEW:   CBC  Recent Labs Lab 03/31/17 1404  WBC 7.7  HGB 12.1*  HCT 35.2*  PLT 264    Chemistries   Recent Labs Lab 04/01/17 0509  NA 135  K 3.4*  CL 101  CO2 28  GLUCOSE 100*  BUN 16  CREATININE 0.75  CALCIUM 7.7*  MG 2.1    Cardiac Enzymes  Recent Labs Lab 03/31/17 1404  TROPONINI <0.03    Microbiology Results  @[email or web address] @  RADIOLOGY:  Dg Chest 2 View  Result Date: 03/31/2017 CLINICAL DATA:  61 year old male with chest and leg pain. EXAM: CHEST  2 VIEW COMPARISON:  01/29/2017 and earlier. 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 Stable lung volumes. Mediastinal contours remain normal. No pneumothorax, pulmonary edema, pleural effusion or acute pulmonary opacity. Calcified aortic atherosclerosis. Osteopenia. No acute osseous abnormality identified. Negative visible bowel gas pattern. IMPRESSION: Chronic lung disease.  No acute cardiopulmonary abnormality. Calcified aortic atherosclerosis. Electronically Signed   By: Genevie Ann M.D.   On: 03/31/2017 14:37      Current Discharge Medication List    CONTINUE these medications which have NOT CHANGED   Details  amLODipine (NORVASC) 10 MG tablet Take 1 tablet (10 mg total) by mouth daily. Qty: 90 tablet, Refills: 3    aspirin EC 81 MG tablet Take 1 tablet (81 mg total) by mouth daily. Qty: 90 tablet, Refills: 3    atorvastatin (LIPITOR) 40 MG tablet Take 1 tablet (40 mg total) by mouth at bedtime. Qty: 90 tablet, Refills: 3    calcium-vitamin D (OSCAL WITH D) 500-200 MG-UNIT tablet Take 2 tablets by mouth daily with breakfast. Qty: 60 tablet, Refills: 0    cetirizine (ZYRTEC) 10 MG tablet Take 10 mg by mouth at bedtime.    clopidogrel (PLAVIX) 75 MG tablet Take 1 tablet (75 mg total) by mouth daily. Qty: 90  tablet, Refills: 3    losartan-hydrochlorothiazide (HYZAAR) 100-12.5 MG tablet Take 1 tablet by mouth daily. Qty: 90 tablet, Refills: 3    metoprolol succinate (TOPROL-XL) 50 MG 24 hr tablet Take 1 tablet (50 mg total) by mouth daily. Qty: 90 tablet, Refills: 3    Multiple Vitamin (MULTIVITAMIN WITH MINERALS) TABS tablet Take 1 tablet by mouth daily. Qty: 30 tablet, Refills: 0    pantoprazole (PROTONIX) 40 MG tablet Take 1 tablet (40 mg total) by mouth 2 (two) times daily. Qty: 90 tablet, Refills: 3    albuterol (PROVENTIL HFA;VENTOLIN HFA) 108 (90 Base) MCG/ACT inhaler Inhale 2 puffs into the lungs every 6 (six) hours as needed for wheezing or shortness of breath. Qty: 1 Inhaler, Refills: 2    Spacer/Aero Chamber  Mouthpiece MISC 1 each by Does not apply route every 4 (four) hours as needed (wheezing). Qty: 1 each, Refills: 0    umeclidinium-vilanterol (ANORO ELLIPTA) 62.5-25 MCG/INH AEPB INHALE ONE PUFF INTO THE LUNGS DAILY Qty: 30 each, Refills: 11          Management plans discussed with the patient and he is in agreement. Stable for discharge home with [facility]  Patient should follow up with pcp  CODE STATUS:     Code Status Orders        Start     Ordered   03/31/17 1925  Full code  Continuous     03/31/17 1924    Code Status History    Date Active Date Inactive Code Status Order ID Comments User Context   09/21/2016  9:14 AM 09/23/2016  7:02 PM Full Code [id number]  Fritzi Mandes, MD Inpatient   12/09/2015  5:05 PM 12/16/2015  6:03 PM Full Code [id number]  Bettey Costa, MD Inpatient   10/14/2015  1:42 PM 10/16/2015  4:29 PM Full Code [id number]  Nicholes Mango, MD Inpatient      TOTAL TIME TAKING CARE OF THIS PATIENT: 37 minutes.    Note: This dictation was prepared with Dragon dictation along with smaller phrase technology. Any transcriptional errors that result from this process are unintentional.  Lajuane Leatham M.D on 04/01/2017 at 8:22 AM  Between 7am to 6pm - Pager - [phone] After 6pm go to [email or web address] - password [email or web address]  Office  [phone]  CC: Primary care physician; Lavonne Chick, MD

## 2017-04-01 NOTE — Progress Notes (Signed)
PHARMACY CONSULT NOTE - INITIAL   Pharmacy Consult for electrolyte monitoring   No Known Allergies  Patient Measurements: Height: 5\' 7"  (170.2 cm) Weight: 177 lb 11.2 oz (80.6 kg) IBW/kg (Calculated) : 66.1  Labs:  Recent Labs  03/31/17 1404 04/01/17 0509  WBC 7.7  --   HGB 12.1*  --   HCT 35.2*  --   PLT 264  --   CREATININE 0.86 0.75  MG 0.5* 2.1  PHOS 3.6  --    Estimated Creatinine Clearance: 98.6 mL/min (by C-G formula based on SCr of 0.75 mg/dL).  Lab Results  Component Value Date   K 3.4 (L) 04/01/2017    Assessment: 61 yo male with hypokalemia and Hypomagnesemia. Pharmacy consulted for electrolyte management.   K: 3.4 Mg: 2.1 Phos:3.6  Goal of Therapy:  K: 3.5-5 Mg: 1.9-2.4 Phos: 2.5-4.6  Plan:  Will give an additional 20 MEQ of KCL and recheck all electrolytes in the AM.  Ramond Dial, PharmD, BCPS Clinical Pharmacist 04/01/2017 7:56 AM

## 2017-04-01 NOTE — Evaluation (Signed)
Physical Therapy Evaluation Patient Details Name: Austin Patterson MRN: [id number] DOB: Nov 27, 1955 Today's Date: 04/01/2017   History of Present Illness  61 y.o. male with a known history of Chronic respiratory failure on home oxygen chronic, interstitial bilateral pulmonary fibrosis who has recently been started on IV Zemaira once a week as outpatient which was initiated by pulmonologist, hypertension comes to the emergency room with generalized weakness and fatigue with malaise.  Clinical Impression  Pt did very well with PT exam and showed good confidence ambulating without AD and had no safety issues.  He had consistent cadence and good speed, no fatigue (on 2 liters, HR did climb).  Overall pt did well, no further PT needs.     Follow Up Recommendations No PT follow up    Equipment Recommendations       Recommendations for Other Services       Precautions / Restrictions Precautions Precautions: None Restrictions Weight Bearing Restrictions: No      Mobility  Bed Mobility Overal bed mobility: Independent                Transfers Overall transfer level: Independent Equipment used: None             General transfer comment: Pt safe and confident getting to standing  Ambulation/Gait Ambulation/Gait assistance: Independent Ambulation Distance (Feet): 200 Feet Assistive device: None       General Gait Details: Pt ambulated with good speed and confidence and had no safety issues.  Pt on 2 liters O2 t/o the effort, sats stayed in the high 90s, HR did increase to nearly 120.  Stairs            Wheelchair Mobility    Modified Rankin (Stroke Patients Only)       Balance Overall balance assessment: Independent                                           Pertinent Vitals/Pain Pain Assessment: No/denies pain    Home Living Family/patient expects to be discharged to:: Private residence Living Arrangements: Spouse/significant  other     Home Access: Ramped entrance              Prior Function Level of Independence: Independent         Comments: Pt able to stay active, out of the house QD     Hand Dominance        Extremity/Trunk Assessment   Upper Extremity Assessment Upper Extremity Assessment: Overall WFL for tasks assessed    Lower Extremity Assessment Lower Extremity Assessment: Overall WFL for tasks assessed       Communication   Communication: No difficulties  Cognition Arousal/Alertness: Awake/alert Behavior During Therapy: WFL for tasks assessed/performed Overall Cognitive Status: Within Functional Limits for tasks assessed                                        General Comments      Exercises     Assessment/Plan    PT Assessment Patent does not need any further PT services  PT Problem List         PT Treatment Interventions      PT Goals (Current goals can be found in the Care Plan section)  Acute Rehab PT Goals Patient  Stated Goal: go home today PT Goal Formulation: All assessment and education complete, DC therapy    Frequency     Barriers to discharge        Co-evaluation               AM-PAC PT "6 Clicks" Daily Activity  Outcome Measure Difficulty turning over in bed (including adjusting bedclothes, sheets and blankets)?: None Difficulty moving from lying on back to sitting on the side of the bed? : None Difficulty sitting down on and standing up from a chair with arms (e.g., wheelchair, bedside commode, etc,.)?: None Help needed moving to and from a bed to chair (including a wheelchair)?: None Help needed walking in hospital room?: None Help needed climbing 3-5 steps with a railing? : None 6 Click Score: 24    End of Session Equipment Utilized During Treatment: Gait belt;Oxygen (2 liters) Activity Tolerance: Patient tolerated treatment well Patient left: in chair;with call bell/phone within reach   PT Visit Diagnosis:  Muscle weakness (generalized) (M62.81);Difficulty in walking, not elsewhere classified (R26.2)    Time: 0912-0927 PT Time Calculation (min) (ACUTE ONLY): 15 min   Charges:   PT Evaluation $PT Eval Low Complexity: 1 Procedure     PT G Codes:        Kreg Shropshire, DPT 04/01/2017, 11:07 AM

## 2017-04-06 ENCOUNTER — Encounter: Payer: Self-pay | Admitting: Internal Medicine

## 2017-04-06 ENCOUNTER — Ambulatory Visit (INDEPENDENT_AMBULATORY_CARE_PROVIDER_SITE_OTHER): Payer: Medicaid Other | Admitting: Internal Medicine

## 2017-04-06 VITALS — BP 128/80 | HR 103 | Resp 16 | Ht 67.0 in | Wt 182.6 lb

## 2017-04-06 DIAGNOSIS — J9611 Chronic respiratory failure with hypoxia: Secondary | ICD-10-CM

## 2017-04-06 NOTE — Patient Instructions (Signed)
Continue oxygen as prescribed Continue inhalers as prescribed 

## 2017-04-06 NOTE — Progress Notes (Signed)
* [facility]   Date: 04/06/2017  MRN# [id number] Austin Patterson 07-May-1956  CC follow up SOB/COPD HPI:   The patient is a 61 year old male with a history advanced COPD, interstitial lung disease. He has been on Anoro.  Doing well on Anoro  Since his last visit he feels that his breathing has been about the same. He is no longer working and is now on disability, he is on oxygen at 2L at rest and 4L with activity. He has never gone through pulmonary rehab. He is currently on prednisone 5 mg, He is taking esbriet 3 tabs 3 times per day.    An alpha-1 antitrypsin serum level was 158. 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   Pulmonary function testing 01/29/16: Spirometry FEV1 was 1.69 L which is 52% of predicted, there was no significant improvement with bronchodilator therapy. The FVC was 61% of predicted, the FEV to FVC ratio was 65%. Lung volumes: Residual volume was 60% of predicted, TLC was 59% of predicted, RV/TLC ratio was 112% of predicted. Diffusion capacity: 26% of predicted. Interpretation. 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   CT chest 01/29/16: continued severe interstitial changes throughout both lungs, worst in the upper lobes. The previously seen pneumonic changes from 12/11/15 in the left base and to a lesser degree in the right base have improved. significant bullous emphysema as well as advancing interstitial changes seen onCT chest 12/11/15, in particular his emphysematous changes on the CT appeared to have advanced significantly since the previous CT, which was 8 months prior.  OV 6.19.18 Patient with chronic restrictive failure doing well with oxygen therapy Shortness of breath and dyspnea on exertion is stable at this time Known signs of infection at this time No signs of acute heart failure at this  time patient was started on alpha-1 infusions one week ago Patient is off prednisone Patient is off the ESBREIT therapy at this time Overall doing well no acute issues at this time  Medication:   Outpatient Encounter Prescriptions as of 04/06/2017  Medication Sig  . albuterol (PROVENTIL HFA;VENTOLIN HFA) 108 (90 Base) MCG/ACT inhaler Inhale 2 puffs into the lungs every 6 (six) hours as needed for wheezing or shortness of breath.  [name] amLODipine (NORVASC) 10 MG tablet Take 1 tablet (10 mg total) by mouth daily.  [name] aspirin EC 81 MG tablet Take 1 tablet (81 mg total) by mouth daily.  [name] atorvastatin (LIPITOR) 40 MG tablet Take 1 tablet (40 mg total) by mouth at bedtime.  . calcium-vitamin D (OSCAL WITH D) 500-200 MG-UNIT tablet Take 2 tablets by mouth daily with breakfast.  . cetirizine (ZYRTEC) 10 MG tablet Take 10 mg by mouth at bedtime.  . clopidogrel (PLAVIX) 75 MG tablet Take 1 tablet (75 mg total) by mouth daily.  [name] losartan-hydrochlorothiazide (HYZAAR) 100-12.5 MG tablet Take 1 tablet by mouth daily.  . metoprolol succinate (TOPROL-XL) 50 MG 24 hr tablet Take 1 tablet (50 mg total) by mouth daily.  . Multiple Vitamin (MULTIVITAMIN WITH MINERALS) TABS tablet Take 1 tablet by mouth daily.  . pantoprazole (PROTONIX) 40 MG tablet Take 1 tablet (40 mg total) by mouth 2 (two) times daily.  [name] Spacer/Aero Chamber Mouthpiece MISC 1 each by Does not apply route every 4 (four) hours as needed (wheezing).  [name] umeclidinium-vilanterol (ANORO ELLIPTA) 62.5-25 MCG/INH AEPB INHALE ONE PUFF INTO THE LUNGS DAILY  No facility-administered encounter medications on file as of 04/06/2017.      Allergies:  Patient has no known allergies.  Review of Systems: Gen:  Denies  fever, sweats. HEENT: Denies blurred vision. Cvc:  No dizziness, chest pain or heaviness Resp:   Denies cough or sputum porduction.+ Shortness of breath + distant exertion (stable) Other:  All other systems were reviewed and found to be  negative other than what is mentioned in the HPI.   Physical Examination:   BP 128/80 (BP Location: Left Arm, Cuff Size: Normal)   Pulse (!) 103   Resp 16   Ht 5\' 7"  (1.702 m)   Wt 182 lb 9.6 oz (82.8 kg)   SpO2 98%   BMI 28.60 kg/m   General Appearance: No distress  Neuro:without focal findings,   HEENT: PERRLA, EOM intact. Pulmonary: scattered crackles decreased breath sounds bilaterally.  CardiovascularNormal S1,S2.  No m/r/g.   Extremities: normal, no cyanosis, clubbing.     Assessment and Plan: 61 year old male with severe bullous emphysema, with severe interstitial lung disease, chronic hypoxic respiratory failure With Alpha one deficiency   Severe Interstitial lung disease-with imaging findings c/w IPF. - advancing interstitial lung disease, discussed the diagnosis of pulmonary fibrosis, as well as its progressive nature. 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  -Cut down prednisone last week -esbriet has been stopped due to liver cirrhosis findings    Severe COPD/Bullous emphysema.  Severe bullous emphysema, significantly advanced on most recent CT chest over the last several months. - Maintain O2 saturations greater than 88% - Anoro once daily. 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  -Secondary to above, continue oxygen.    Nicotine abuse. -He quit smoking about 1.5 year ago, discussed importance of continued smoking cessation. 3 min spent in counseling.    Patient satisfied with Plan of action and management. All questions answered Follow-up 6 months   Austin Patterson, M.D.  [facility]  Medical Director [facility]  Director [facility] Cardio-Pulmonary Department

## 2017-05-31 ENCOUNTER — Telehealth: Payer: Self-pay | Admitting: Internal Medicine

## 2017-05-31 NOTE — Telephone Encounter (Signed)
Pharmacy is calling stating Zameera, but it Is on national back order He is calling to see if we can switch him over to Aralast  They have no idea when they may have some back in stock   Please advise.

## 2017-06-01 NOTE — Telephone Encounter (Signed)
Ok to switch, I assume this will be covered by insurance?

## 2017-06-01 NOTE — Telephone Encounter (Signed)
Called pharmacy back and gave verbal ok to switch medication. Spoke with Claiborne Billings).

## 2017-06-16 ENCOUNTER — Telehealth: Payer: Self-pay | Admitting: Internal Medicine

## 2017-06-16 NOTE — Telephone Encounter (Signed)
[place] with home health calling stating pt started on Aralast in place of Zamera  He is complaining of nausea  Refused to take infusion this week Given last week He states he was feeling  "blah"   Would need advise on this please call back

## 2017-06-16 NOTE — Telephone Encounter (Signed)
Please advise on message below Thanks

## 2017-06-17 ENCOUNTER — Emergency Department: Payer: Medicaid Other

## 2017-06-17 ENCOUNTER — Encounter: Payer: Self-pay | Admitting: Emergency Medicine

## 2017-06-17 ENCOUNTER — Inpatient Hospital Stay
Admission: EM | Admit: 2017-06-17 | Discharge: 2017-06-18 | DRG: 439 | Disposition: A | Discharge status: Home / Self Care | Payer: Medicaid Other | Attending: Internal Medicine | Admitting: Internal Medicine

## 2017-06-17 DIAGNOSIS — R531 Weakness: Secondary | ICD-10-CM

## 2017-06-17 DIAGNOSIS — Z7902 Long term (current) use of antithrombotics/antiplatelets: Secondary | ICD-10-CM

## 2017-06-17 DIAGNOSIS — K852 Alcohol induced acute pancreatitis without necrosis or infection: Secondary | ICD-10-CM | POA: Diagnosis not present

## 2017-06-17 DIAGNOSIS — Z955 Presence of coronary angioplasty implant and graft: Secondary | ICD-10-CM

## 2017-06-17 DIAGNOSIS — Z8249 Family history of ischemic heart disease and other diseases of the circulatory system: Secondary | ICD-10-CM

## 2017-06-17 DIAGNOSIS — J449 Chronic obstructive pulmonary disease, unspecified: Secondary | ICD-10-CM | POA: Diagnosis present

## 2017-06-17 DIAGNOSIS — Z87891 Personal history of nicotine dependence: Secondary | ICD-10-CM | POA: Diagnosis not present

## 2017-06-17 DIAGNOSIS — K2920 Alcoholic gastritis without bleeding: Secondary | ICD-10-CM | POA: Diagnosis present

## 2017-06-17 DIAGNOSIS — J84112 Idiopathic pulmonary fibrosis: Secondary | ICD-10-CM | POA: Diagnosis present

## 2017-06-17 DIAGNOSIS — I10 Essential (primary) hypertension: Secondary | ICD-10-CM | POA: Diagnosis present

## 2017-06-17 DIAGNOSIS — Z9981 Dependence on supplemental oxygen: Secondary | ICD-10-CM | POA: Diagnosis not present

## 2017-06-17 DIAGNOSIS — N289 Disorder of kidney and ureter, unspecified: Secondary | ICD-10-CM

## 2017-06-17 DIAGNOSIS — R197 Diarrhea, unspecified: Secondary | ICD-10-CM

## 2017-06-17 DIAGNOSIS — E876 Hypokalemia: Secondary | ICD-10-CM | POA: Diagnosis present

## 2017-06-17 DIAGNOSIS — F1010 Alcohol abuse, uncomplicated: Secondary | ICD-10-CM | POA: Diagnosis present

## 2017-06-17 DIAGNOSIS — N179 Acute kidney failure, unspecified: Secondary | ICD-10-CM | POA: Diagnosis present

## 2017-06-17 DIAGNOSIS — R1010 Upper abdominal pain, unspecified: Secondary | ICD-10-CM

## 2017-06-17 DIAGNOSIS — E871 Hypo-osmolality and hyponatremia: Secondary | ICD-10-CM | POA: Diagnosis not present

## 2017-06-17 DIAGNOSIS — E860 Dehydration: Secondary | ICD-10-CM | POA: Diagnosis present

## 2017-06-17 DIAGNOSIS — R112 Nausea with vomiting, unspecified: Secondary | ICD-10-CM

## 2017-06-17 DIAGNOSIS — Z7982 Long term (current) use of aspirin: Secondary | ICD-10-CM | POA: Diagnosis not present

## 2017-06-17 LAB — COMPREHENSIVE METABOLIC PANEL
ALK PHOS: 41 U/L (ref 38–126)
ALT: 49 U/L (ref 17–63)
AST: 121 U/L — ABNORMAL HIGH (ref 15–41)
Albumin: 4.5 g/dL (ref 3.5–5.0)
Anion gap: 16 — ABNORMAL HIGH (ref 5–15)
BUN: 18 mg/dL (ref 6–20)
CALCIUM: 8.5 mg/dL — AB (ref 8.9–10.3)
CO2: 22 mmol/L (ref 22–32)
CREATININE: 1.51 mg/dL — AB (ref 0.61–1.24)
Chloride: 89 mmol/L — ABNORMAL LOW (ref 101–111)
GFR calc Af Amer: 56 mL/min — ABNORMAL LOW (ref >60)
GFR, EST NON AFRICAN AMERICAN: 48 mL/min — AB (ref >60)
Glucose, Bld: 124 mg/dL — ABNORMAL HIGH (ref 65–99)
Potassium: 3.3 mmol/L — ABNORMAL LOW (ref 3.5–5.1)
Sodium: 127 mmol/L — ABNORMAL LOW (ref 135–145)
Total Bilirubin: 2 mg/dL — ABNORMAL HIGH (ref 0.3–1.2)
Total Protein: 7.6 g/dL (ref 6.5–8.1)

## 2017-06-17 LAB — SODIUM
SODIUM: 130 mmol/L — AB (ref 135–145)
SODIUM: 133 mmol/L — AB (ref 135–145)

## 2017-06-17 LAB — URINALYSIS, COMPLETE (UACMP) WITH MICROSCOPIC
BACTERIA UA: NONE SEEN
Bilirubin Urine: NEGATIVE
Glucose, UA: NEGATIVE mg/dL
HGB URINE DIPSTICK: NEGATIVE
KETONES UR: NEGATIVE mg/dL
Leukocytes, UA: NEGATIVE
Nitrite: NEGATIVE
PROTEIN: NEGATIVE mg/dL
RBC / HPF: NONE SEEN RBC/hpf (ref 0–5)
Specific Gravity, Urine: 1.006 (ref 1.005–1.030)
pH: 5 (ref 5.0–8.0)

## 2017-06-17 LAB — CBC
HEMATOCRIT: 37.7 % — AB (ref 40.0–52.0)
Hemoglobin: 13.6 g/dL (ref 13.0–18.0)
MCH: 36.7 pg — AB (ref 26.0–34.0)
MCHC: 36.1 g/dL — ABNORMAL HIGH (ref 32.0–36.0)
MCV: 101.7 fL — AB (ref 80.0–100.0)
Platelets: 282 10*3/uL (ref 150–440)
RBC: 3.71 MIL/uL — ABNORMAL LOW (ref 4.40–5.90)
RDW: 15.2 % — ABNORMAL HIGH (ref 11.5–14.5)
WBC: 10.2 10*3/uL (ref 3.8–10.6)

## 2017-06-17 LAB — TROPONIN I

## 2017-06-17 LAB — LIPASE, BLOOD: Lipase: 83 U/L — ABNORMAL HIGH (ref 11–51)

## 2017-06-17 MED ORDER — LORAZEPAM 1 MG PO TABS: 1.0000 mg | ORAL_TABLET | Freq: Four times a day (QID) | ORAL | Status: DC | PRN

## 2017-06-17 MED ORDER — CHLORHEXIDINE GLUCONATE 0.12 % MT SOLN
15.0000 mL | Freq: Two times a day (BID) | OROMUCOSAL | Status: DC
  Administered 2017-06-18: 09:00:00 15 mL via OROMUCOSAL
  Filled 2017-06-17: qty 15

## 2017-06-17 MED ORDER — ORAL CARE MOUTH RINSE: 15.0000 mL | Freq: Two times a day (BID) | OROMUCOSAL | Status: DC

## 2017-06-17 MED ORDER — THIAMINE HCL 100 MG/ML IJ SOLN
INTRAVENOUS | Status: DC
  Administered 2017-06-17: 20:00:00 via INTRAVENOUS
  Filled 2017-06-17 (×4): qty 1000

## 2017-06-17 MED ORDER — PANTOPRAZOLE SODIUM 40 MG IV SOLR
40.0000 mg | INTRAVENOUS | Status: DC
  Administered 2017-06-17: 40 mg via INTRAVENOUS
  Filled 2017-06-17: qty 40

## 2017-06-17 MED ORDER — ENOXAPARIN SODIUM 30 MG/0.3ML ~~LOC~~ SOLN
30.0000 mg | SUBCUTANEOUS | Status: DC
  Administered 2017-06-17: 30 mg via SUBCUTANEOUS
  Filled 2017-06-17: qty 0.3

## 2017-06-17 MED ORDER — LORAZEPAM 2 MG/ML IJ SOLN: 1.0000 mg | Freq: Four times a day (QID) | INTRAMUSCULAR | Status: DC | PRN

## 2017-06-17 MED ORDER — MORPHINE SULFATE (PF) 2 MG/ML IV SOLN: 2.0000 mg | INTRAVENOUS | Status: DC | PRN

## 2017-06-17 MED ORDER — METOPROLOL SUCCINATE ER 50 MG PO TB24
50.0000 mg | ORAL_TABLET | Freq: Every day | ORAL | Status: DC
  Administered 2017-06-18: 09:00:00 50 mg via ORAL
  Filled 2017-06-17: qty 1

## 2017-06-17 MED ORDER — CLOPIDOGREL BISULFATE 75 MG PO TABS
75.0000 mg | ORAL_TABLET | Freq: Every day | ORAL | Status: DC
  Administered 2017-06-18: 75 mg via ORAL
  Filled 2017-06-17: qty 1

## 2017-06-17 MED ORDER — POLYETHYLENE GLYCOL 3350 17 G PO PACK: 17.0000 g | PACK | Freq: Every day | ORAL | Status: DC | PRN

## 2017-06-17 MED ORDER — AMLODIPINE BESYLATE 10 MG PO TABS: 10.0000 mg | ORAL_TABLET | Freq: Every day | ORAL | Status: DC

## 2017-06-17 MED ORDER — MORPHINE SULFATE (PF) 2 MG/ML IV SOLN
2.0000 mg | Freq: Once | INTRAVENOUS | Status: AC
  Administered 2017-06-17: 2 mg via INTRAVENOUS
  Filled 2017-06-17: qty 1

## 2017-06-17 MED ORDER — SODIUM CHLORIDE 0.9 % IV BOLUS (SEPSIS)
1000.0000 mL | Freq: Once | INTRAVENOUS | Status: AC
  Administered 2017-06-17: 1000 mL via INTRAVENOUS

## 2017-06-17 MED ORDER — ONDANSETRON HCL 4 MG/2ML IJ SOLN
4.0000 mg | Freq: Once | INTRAMUSCULAR | Status: AC
  Administered 2017-06-17: 4 mg via INTRAVENOUS
  Filled 2017-06-17: qty 2

## 2017-06-17 MED ORDER — ALBUTEROL SULFATE (2.5 MG/3ML) 0.083% IN NEBU: 3.0000 mL | INHALATION_SOLUTION | Freq: Four times a day (QID) | RESPIRATORY_TRACT | Status: DC | PRN

## 2017-06-17 MED ORDER — TRAMADOL HCL 50 MG PO TABS: 50.0000 mg | ORAL_TABLET | Freq: Four times a day (QID) | ORAL | Status: DC | PRN

## 2017-06-17 NOTE — ED Notes (Signed)
Discussed patient priority for a room with Charge Nurse.  Charge Nurse aware and will call back when room for patient is available.  Patient visualized by this RN, No obvious distress at this time.

## 2017-06-17 NOTE — ED Notes (Signed)
Pt reports he feels dehydrated.  Pt states n/v/d for 1 week.  Pt sent from pmd for eval in er today.  Pt alert.  Speech clear. Pt is on 2 liters oxygen.  Iv fluids infusing.

## 2017-06-17 NOTE — ED Notes (Signed)
Pt watching tv.  No n/v/d.  Iv fluids infusing.

## 2017-06-17 NOTE — Telephone Encounter (Signed)
Ok to hold off on this week's infusion.

## 2017-06-17 NOTE — ED Provider Notes (Signed)
This patient was signed out to me by Dr. Dineen Kid. 61 year old gentleman with abdominal pain, nausea vomiting and diarrhea. Overall, the patient is hemodynamically stable and hasn't NOTICED DOES NOT SHOW ANY PATHOLOGY. HOWEVER, HE HAS HYPONATREMIA, RENAL INSUFFICIENCY FROM DEHYDRATION, AND ELEVATED LIPASE, AND SOME ELEVATED LFTS. IN ADDITION, HE CONTINUES TO HAVE SOME PAIN AND NAUSEA. WILL PLAN ADMISSION FOR FURTHER SYMPTOMATIC TREATMENT AT THIS TIME.   Eula Listen, MD 06/17/17 [phone]

## 2017-06-17 NOTE — Telephone Encounter (Signed)
LMOM for nurse to return call.

## 2017-06-17 NOTE — ED Triage Notes (Signed)
Pt sent from PCP for evaluation of possible dehydration. Pt reports NVD for over one week. Denies pain. States legs feel weak and rubbery. Pt wears 2L O2 via [place] at all times. Pt in no apparent distress in triage.

## 2017-06-17 NOTE — ED Provider Notes (Signed)
[facility] Emergency Department Provider Note  ____________________________________________   First MD Initiated Contact with Patient 06/17/17 1400     (approximate)  I have reviewed the triage vital signs and the nursing notes.   HISTORY  Chief Complaint Emesis and Diarrhea   HPI Austin Patterson is a 61 y.o. male with a history of idiopathic pulmonary fibrosis as well as hypertension on 2 L of nasal cannula oxygen chronically was presenting with 1 week of upper abdominal pressure as well nausea vomiting and diarrhea. He says that he has not vomited all today. Says that he has had one episode of diarrhea per day over the past week. Denies any recent travel or antibiotics. Says that he also feels a pressure to the center of his chest but this is chronic for him over the past several months and attributes it to his lung disease. He denies any increase in his shortness of breath from his baseline.Patient also says that he has bilateral weakness to his lower extremities and has been having difficulty walking this week. He says this is what is bothering him most.   Past Medical History:  Diagnosis Date  . COPD (chronic obstructive pulmonary disease) ([place])   . Hyperlipidemia   . Hypertension   . Pulmonary fibrosis ([place]) 11/2015    Patient Active Problem List   Diagnosis Date Noted  . Hypomagnesemia 03/31/2017  . Hiatal hernia   . Reflux esophagitis   . Hematemesis without nausea   . Hyponatremia 09/21/2016  . Pneumonia 12/09/2015  . COPD with acute exacerbation ([place]) 10/14/2015    Past Surgical History:  Procedure Laterality Date  . CORONARY STENT PLACEMENT    . ESOPHAGOGASTRODUODENOSCOPY (EGD) WITH PROPOFOL N/A 09/23/2016   Procedure: ESOPHAGOGASTRODUODENOSCOPY (EGD) WITH PROPOFOL;  Surgeon: [name], MD;  Location: [facility] ENDOSCOPY;  Service: Endoscopy;  Laterality: N/A;  . SHOULDER ACROMIOPLASTY      Prior to Admission medications     Medication Sig Start Date End Date Taking? Authorizing Provider  albuterol (PROVENTIL HFA;VENTOLIN HFA) 108 (90 Base) MCG/ACT inhaler Inhale 2 puffs into the lungs every 6 (six) hours as needed for wheezing or shortness of breath. 01/29/17   Darel Hong, MD  amLODipine (NORVASC) 10 MG tablet Take 1 tablet (10 mg total) by mouth daily. 08/19/16   Tawni Millers, MD  aspirin EC 81 MG tablet Take 1 tablet (81 mg total) by mouth daily. 08/19/16   Tawni Millers, MD  atorvastatin (LIPITOR) 40 MG tablet Take 1 tablet (40 mg total) by mouth at bedtime. 08/19/16   Tawni Millers, MD  calcium-vitamin D (OSCAL WITH D) 500-200 MG-UNIT tablet Take 2 tablets by mouth daily with breakfast. 09/24/16   Fritzi Mandes, MD  cetirizine (ZYRTEC) 10 MG tablet Take 10 mg by mouth at bedtime. 08/19/16   [provider]  clopidogrel (PLAVIX) 75 MG tablet Take 1 tablet (75 mg total) by mouth daily. 08/19/16   Tawni Millers, MD  losartan-hydrochlorothiazide (HYZAAR) 100-12.5 MG tablet Take 1 tablet by mouth daily. 08/19/16   Tawni Millers, MD  metoprolol succinate (TOPROL-XL) 50 MG 24 hr tablet Take 1 tablet (50 mg total) by mouth daily. 08/19/16   Tawni Millers, MD  Multiple Vitamin (MULTIVITAMIN WITH MINERALS) TABS tablet Take 1 tablet by mouth daily. 09/24/16   Fritzi Mandes, MD  pantoprazole (PROTONIX) 40 MG tablet Take 1 tablet (40 mg total) by mouth 2 (two) times daily. 09/23/16   Fritzi Mandes, MD  Spacer/Aero  Chamber Mouthpiece MISC 1 each by Does not apply route every 4 (four) hours as needed (wheezing). 01/29/17   Darel Hong, MD  umeclidinium-vilanterol [facility] ELLIPTA) 62.5-25 MCG/INH AEPB INHALE ONE PUFF INTO THE LUNGS DAILY 01/07/17   Flora Lipps, MD    Allergies Patient has no known allergies.  Family History  Problem Relation Age of Onset  . Heart disease Mother     Social History Social History  Substance Use Topics  . Smoking status: Former Smoker    Quit date: 10/14/2015  . Smokeless tobacco:  Former Systems developer  . Alcohol use 16.8 oz/week    28 Cans of beer per week     Comment: 6 12 oz cans of beer/day    Review of Systems  Constitutional: No fever/chills Eyes: No visual changes. ENT: No sore throat. Cardiovascular: Denies chest pain. Respiratory: Denies shortness of breath. Gastrointestinal:  No constipation. Genitourinary: Negative for dysuria. Musculoskeletal: Negative for back pain. Skin: Negative for rash. Neurological: Negative for headaches, focal weakness or numbness.   ____________________________________________   PHYSICAL EXAM:  VITAL SIGNS: ED Triage Vitals [06/17/17 1242]  Enc Vitals Group     BP 101/72     Pulse Rate (!) 106     Resp 20     Temp 98.6 F (37 C)     Temp Source Oral     SpO2 95 %     Weight 180 lb (81.6 kg)     Height 5\' 8"  (1.727 m)     Head Circumference      Peak Flow      Pain Score      Pain Loc      Pain Edu?      Excl. in [place]?     Constitutional: Alert and oriented. Well appearing and in no acute distress. Eyes: Conjunctivae are normal.  Head: Atraumatic. Nose: No congestion/rhinnorhea. Mouth/Throat: Mucous membranes are moist.  Neck: No stridor.   Cardiovascular: Normal rate, regular rhythm. Grossly normal heart sounds.   Respiratory: Normal respiratory effort.  No retractions. Lungs CTAB. Gastrointestinal: Soft With mild tenderness across the upper abdomen with a negative Murphy sign. No distention. No  Musculoskeletal: No lower extremity tenderness nor edema.  No joint effusions. Neurologic:  Normal speech and language. No gross focal neurologic deficits are appreciated. Skin:  Skin is warm, dry and intact. No rash noted. Psychiatric: Mood and affect are normal. Speech and behavior are normal.  ____________________________________________   LABS (all labs ordered are listed, but only abnormal results are displayed)  Labs Reviewed  LIPASE, BLOOD - Abnormal; Notable for the following:       Result Value    Lipase 83 (*)    All other components within normal limits  COMPREHENSIVE METABOLIC PANEL - Abnormal; Notable for the following:    Sodium 127 (*)    Potassium 3.3 (*)    Chloride 89 (*)    Glucose, Bld 124 (*)    Creatinine, Ser 1.51 (*)    Calcium 8.5 (*)    AST 121 (*)    Total Bilirubin 2.0 (*)    GFR calc non Af Amer 48 (*)    GFR calc Af Amer 56 (*)    Anion gap 16 (*)    All other components within normal limits  CBC - Abnormal; Notable for the following:    RBC 3.71 (*)    HCT 37.7 (*)    MCV 101.7 (*)    MCH 36.7 (*)    MCHC  36.1 (*)    RDW 15.2 (*)    All other components within normal limits  TROPONIN I  URINALYSIS, COMPLETE (UACMP) WITH MICROSCOPIC   ____________________________________________  EKG  ED ECG REPORT I, Anylah Scheib,  Youlanda Roys, the attending physician, personally viewed and interpreted this ECG.   Date: 06/17/2017  EKG Time: 1404  Rate: 90  Rhythm: normal sinus rhythm  Axis: Normal  Intervals:none  ST&T Change: No ST segment elevation or depression. Single T-wave inversion in aVL. No significant Change from previous of 03/31/2017.  ____________________________________________  RADIOLOGY  Chest x-ray with chronic lung disease without acute disease. ____________________________________________   PROCEDURES  Procedure(s) performed:   Procedures  Critical Care performed:   ____________________________________________   INITIAL IMPRESSION / ASSESSMENT AND PLAN / ED COURSE  Pertinent labs & imaging results that were available during my care of the patient were reviewed by me and considered in my medical decision making (see chart for details).  ----------------------------------------- 315 PM on 06/17/2017 -----------------------------------------  Patient pending ultrasound of his gallbladder at this time. Likely will require [place] to the hospital. Patient is understanding of this plan. Signed out to Dr. Mariea Clonts.       ____________________________________________   FINAL CLINICAL IMPRESSION(S) / ED DIAGNOSES  Final diagnoses:  Upper abdominal pain  Hyponatremia. Nausea vomiting and diarrhea. Weakness.    NEW MEDICATIONS STARTED DURING THIS VISIT:  New Prescriptions   No medications on file     Note:  This document was prepared using Dragon voice recognition software and may include unintentional dictation errors.     Orbie Pyo, MD 06/17/17 [phone]

## 2017-06-17 NOTE — H&P (Signed)
[place] [facility] [place] NAME: Austin Patterson    MR#:  [id number]  DATE OF BIRTH:  1956-09-06  DATE OF ADMISSION:  06/17/2017  PRIMARY CARE PHYSICIAN: Lavonne Chick, MD   REQUESTING/REFERRING PHYSICIAN: Mariea Clonts  CHIEF COMPLAINT:  Nausea vomiting, epigastric abdominal pain  HISTORY OF PRESENT ILLNESS:  Austin Patterson  is a 61 y.o. male with a known history of COPD, hyperlipidemia, hypertension, pulmonary fibrosis and alcohol abuse is presenting to the ED with a chief complaint of intractable nausea and vomiting for 1 week associated with epigastric abdominal pain. Denies any diarrhea. Lipase is elevated and sodium is at 127. Abdominal ultrasound with no acute abnormalities. Patient was given IV fluids and hospitalist team is called to admit the patient. Patient admits drinking on: Daily basis and he drinks 6 cans of beer, patient was treated today during my examination his last drink was 2-3 days ago   PAST MEDICAL HISTORY:   Past Medical History:  Diagnosis Date  . COPD (chronic obstructive pulmonary disease) ([place])   . Hyperlipidemia   . Hypertension   . Pulmonary fibrosis ([place]) 11/2015    PAST SURGICAL HISTOIRY:   Past Surgical History:  Procedure Laterality Date  . CORONARY STENT PLACEMENT    . ESOPHAGOGASTRODUODENOSCOPY (EGD) WITH PROPOFOL N/A 09/23/2016   Procedure: ESOPHAGOGASTRODUODENOSCOPY (EGD) WITH PROPOFOL;  Surgeon: [name], MD;  Location: [facility] ENDOSCOPY;  Service: Endoscopy;  Laterality: N/A;  . SHOULDER ACROMIOPLASTY      SOCIAL HISTORY:   Social History  Substance Use Topics  . Smoking status: Former Smoker    Quit date: 10/14/2015  . Smokeless tobacco: Former Systems developer  . Alcohol use 16.8 oz/week    28 Cans of beer per week     Comment: 6 12 oz cans of beer/day    FAMILY HISTORY:   Family History  Problem Relation Age of Onset  . Heart disease Mother     DRUG ALLERGIES:  No Known  Allergies  REVIEW OF SYSTEMS:  CONSTITUTIONAL: No fever, fatigue or weakness.  EYES: No blurred or double vision.  EARS, NOSE, AND THROAT: No tinnitus or ear pain.  RESPIRATORY: No cough, shortness of breath, wheezing or hemoptysis.  CARDIOVASCULAR: No chest pain, orthopnea, edema.  GASTROINTESTINAL: reporting nausea, vomiting, and epigastric abdominal pain, denies diarrhea GENITOURINARY: No dysuria, hematuria.  ENDOCRINE: No polyuria, nocturia,  HEMATOLOGY: No anemia, easy bruising or bleeding SKIN: No rash or lesion. MUSCULOSKELETAL: No joint pain or arthritis.   NEUROLOGIC: No tingling, numbness, weakness.  PSYCHIATRY: No anxiety or depression.   MEDICATIONS AT HOME:   Prior to Admission medications   Medication Sig Start Date End Date Taking? Authorizing Provider  albuterol (PROVENTIL HFA;VENTOLIN HFA) 108 (90 Base) MCG/ACT inhaler Inhale 2 puffs into the lungs every 6 (six) hours as needed for wheezing or shortness of breath. 01/29/17  Yes Darel Hong, MD  amLODipine (NORVASC) 10 MG tablet Take 1 tablet (10 mg total) by mouth daily. 08/19/16  Yes Tawni Millers, MD  aspirin EC 81 MG tablet Take 1 tablet (81 mg total) by mouth daily. 08/19/16  Yes Tawni Millers, MD  atorvastatin (LIPITOR) 40 MG tablet Take 1 tablet (40 mg total) by mouth at bedtime. 08/19/16  Yes Tawni Millers, MD  calcium-vitamin D (OSCAL WITH D) 500-200 MG-UNIT tablet Take 2 tablets by mouth daily with breakfast. 09/24/16  Yes Fritzi Mandes, MD  clopidogrel (PLAVIX) 75 MG tablet Take 1 tablet (75 mg total) by  mouth daily. 08/19/16  Yes Tawni Millers, MD  losartan-hydrochlorothiazide (HYZAAR) 100-12.5 MG tablet Take 1 tablet by mouth daily. 08/19/16  Yes Tawni Millers, MD  metoprolol succinate (TOPROL-XL) 50 MG 24 hr tablet Take 1 tablet (50 mg total) by mouth daily. 08/19/16  Yes Tawni Millers, MD  Multiple Vitamin (MULTIVITAMIN WITH MINERALS) TABS tablet Take 1 tablet by mouth daily. 09/24/16  Yes Fritzi Mandes, MD   pantoprazole (PROTONIX) 40 MG tablet Take 1 tablet (40 mg total) by mouth 2 (two) times daily. 09/23/16  Yes Fritzi Mandes, MD  umeclidinium-vilanterol [facility] ELLIPTA) 62.5-25 MCG/INH AEPB INHALE ONE PUFF INTO THE LUNGS DAILY 01/07/17  Yes Flora Lipps, MD  cetirizine (ZYRTEC) 10 MG tablet Take 10 mg by mouth at bedtime. 08/19/16   [provider]  Spacer/Aero Chamber Mouthpiece MISC 1 each by Does not apply route every 4 (four) hours as needed (wheezing). 01/29/17   Darel Hong, MD      VITAL SIGNS:  Blood pressure 113/75, pulse 92, temperature 98.6 F (37 C), temperature source Oral, resp. rate 18, height 5\' 8"  (1.727 m), weight 81.6 kg (180 lb), SpO2 100 %.  PHYSICAL EXAMINATION:  GENERAL:  61 y.o.-year-old patient lying in the bed with no acute distress.  EYES: Pupils equal, round, reactive to light and accommodation. No scleral icterus. Extraocular muscles intact.  HEENT: Head atraumatic, normocephalic. Oropharynx and nasopharynx clear.  NECK:  Supple, no jugular venous distention. No thyroid enlargement, no tenderness.  LUNGS: Normal breath sounds bilaterally, no wheezing, rales,rhonchi or crepitation. No use of accessory muscles of respiration.  CARDIOVASCULAR: S1, S2 normal. No murmurs, rubs, or gallops.  ABDOMEN: Soft, epigastric tenderness is present, no rebound tenderness, nondistended. Bowel sounds present.   EXTREMITIES: No pedal edema, cyanosis, or clubbing.  NEUROLOGIC: Cranial nerves II through XII are intact. Muscle strength 5/5 in all extremities. Sensation intact. Gait not checked. Tremors of upper extremities are present PSYCHIATRIC: The patient is alert and oriented x 3.  SKIN: No obvious rash, lesion, or ulcer.   LABORATORY PANEL:   CBC  Recent Labs Lab 06/17/17 1242  WBC 10.2  HGB 13.6  HCT 37.7*  PLT 282   ------------------------------------------------------------------------------------------------------------------  Chemistries   Recent  Labs Lab 06/17/17 1242  NA 127*  K 3.3*  CL 89*  CO2 22  GLUCOSE 124*  BUN 18  CREATININE 1.51*  CALCIUM 8.5*  AST 121*  ALT 49  ALKPHOS 41  BILITOT 2.0*   ------------------------------------------------------------------------------------------------------------------  Cardiac Enzymes  Recent Labs Lab 06/17/17 1242  TROPONINI <0.03   ------------------------------------------------------------------------------------------------------------------  RADIOLOGY:  Dg Chest 2 View  Result Date: 06/17/2017 CLINICAL DATA:  Weakness.  Chest pain. EXAM: CHEST  2 VIEW COMPARISON:  03/31/2017 FINDINGS: Emphysema. There is no edema, consolidation, effusion, or pneumothorax. Normal heart size and mediastinal contours. IMPRESSION: Chronic lung disease including emphysema. No evidence of active disease. Electronically Signed   By: Monte Fantasia M.D.   On: 06/17/2017 14:53   US Abdomen Limited Ruq  Result Date: 06/17/2017 CLINICAL DATA:  Upper abdominal pain for 1 week. EXAM: ULTRASOUND ABDOMEN LIMITED RIGHT UPPER QUADRANT COMPARISON:  Abdomen and pelvis CT dated 12/04/2014. FINDINGS: Gallbladder: No gallstones or wall thickening visualized. No sonographic Murphy sign noted by sonographer. Common bile duct: Diameter: 3.9 mm Liver: Diffusely echogenic. Corresponding low density on the previous CT. Portal vein is patent on color Doppler imaging with normal direction of blood flow towards the liver. IMPRESSION: 1. No acute abnormality. 2. Diffuse hepatic steatosis. Electronically Signed  By: Claudie Revering M.D.   On: 06/17/2017 15:47    EKG:   Orders placed or performed during the hospital encounter of 03/31/17  . ED EKG within 10 minutes  . ED EKG within 10 minutes  . EKG    IMPRESSION AND PLAN:   Austin Patterson  is a 61 y.o. male with a known history of COPD, hyperlipidemia, hypertension, pulmonary fibrosis and alcohol abuse is presenting to the ED with a chief complaint of  intractable nausea and vomiting for 1 week associated with epigastric abdominal pain. Denies any diarrhea. Lipase is elevated and sodium is at 127. Abdominal ultrasound with no acute abnormalities  #acute epigastric abdominal pain with nausea and vomiting secondary to acute pancreatitis 2/2 alcohol abuse Admit to MedSurg unit Nothing by mouth IV fluids/PPI/antiemetics/supportive treatment Repeat a.m. Labs including CMP and lipase Outpatient AA follow-up Right upper quadrant ultrasound with no acute abnormalities Hold home medication statin   #acute kidney injury from dehydration secondary to nausea and vomiting-prerenal Aggressive hydration with IV fluids Avoid nephrotoxins and monitor renal function closely Holding hyzaar  #hyponatremia from dehydration Mentating fine Hydrate with IV fluids and monitor serial sodiums Check fasting lipid panel Urine osmolality and lites, serum osmolarity  #Hypokalemia Replete and recheck  #essential hypertension Continue home medicine amlodipine and metoprolol and hold Hyzaar in view of acute kidney injury  #Alcohol abuse On withdrawal protocol ciwa Outpatient follow-up with alcoholic anonymous is recommended  GI prophylaxis with Protonix DVT prophylaxis with Lovenox renal dose adjusted  All the records are reviewed and case discussed with ED provider. Management plans discussed with the patient, family and they are in agreement.  CODE STATUS: FC / SON - HCPOA  TOTAL TIME TAKING CARE OF THIS PATIENT: 45  minutes.   Note: This dictation was prepared with Dragon dictation along with smaller phrase technology. Any transcriptional errors that result from this process are unintentional.  Nicholes Mango M.D on 06/17/2017 at 5:19 PM  Between 7am to 6pm - Pager - [phone]  After 6pm go to [email or web address] - password [email or web address] [facility]  [facility]  Office  [phone]  CC: Primary care physician; Lavonne Chick, MD

## 2017-06-17 NOTE — Progress Notes (Signed)
Pt gets weekly infusions by home health RN. Recently started Aralast in place of Zamera. No infusion this past week per patient.

## 2017-06-17 NOTE — ED Notes (Signed)
Report called to dedra rn floor nurse.

## 2017-06-18 LAB — CBC
HCT: 33.6 % — ABNORMAL LOW (ref 40.0–52.0)
Hemoglobin: 12 g/dL — ABNORMAL LOW (ref 13.0–18.0)
MCH: 36.5 pg — ABNORMAL HIGH (ref 26.0–34.0)
MCHC: 35.6 g/dL (ref 32.0–36.0)
MCV: 102.4 fL — AB (ref 80.0–100.0)
PLATELETS: 225 10*3/uL (ref 150–440)
RBC: 3.28 MIL/uL — AB (ref 4.40–5.90)
RDW: 14.7 % — AB (ref 11.5–14.5)
WBC: 6.3 10*3/uL (ref 3.8–10.6)

## 2017-06-18 LAB — COMPREHENSIVE METABOLIC PANEL
ALBUMIN: 3.7 g/dL (ref 3.5–5.0)
ALT: 53 U/L (ref 17–63)
AST: 146 U/L — AB (ref 15–41)
Alkaline Phosphatase: 35 U/L — ABNORMAL LOW (ref 38–126)
Anion gap: 10 (ref 5–15)
BUN: 13 mg/dL (ref 6–20)
CHLORIDE: 98 mmol/L — AB (ref 101–111)
CO2: 26 mmol/L (ref 22–32)
CREATININE: 0.9 mg/dL (ref 0.61–1.24)
Calcium: 7.4 mg/dL — ABNORMAL LOW (ref 8.9–10.3)
GFR calc Af Amer: >60 mL/min (ref >60)
GLUCOSE: 95 mg/dL (ref 65–99)
Potassium: 2.4 mmol/L — CL (ref 3.5–5.1)
SODIUM: 134 mmol/L — AB (ref 135–145)
Total Bilirubin: 2 mg/dL — ABNORMAL HIGH (ref 0.3–1.2)
Total Protein: 6.1 g/dL — ABNORMAL LOW (ref 6.5–8.1)

## 2017-06-18 LAB — MAGNESIUM: MAGNESIUM: 1.1 mg/dL — AB (ref 1.7–2.4)

## 2017-06-18 LAB — TSH: TSH: 1.522 u[IU]/mL (ref 0.350–4.500)

## 2017-06-18 LAB — POTASSIUM: Potassium: 4 mmol/L (ref 3.5–5.1)

## 2017-06-18 LAB — LIPASE, BLOOD: Lipase: 56 U/L — ABNORMAL HIGH (ref 11–51)

## 2017-06-18 MED ORDER — POTASSIUM CHLORIDE 10 MEQ/100ML IV SOLN
10.0000 meq | INTRAVENOUS | Status: AC
  Administered 2017-06-18 (×5): 10 meq via INTRAVENOUS
  Filled 2017-06-18: qty 100

## 2017-06-18 MED ORDER — ENOXAPARIN SODIUM 40 MG/0.4ML ~~LOC~~ SOLN: 40.0000 mg | SUBCUTANEOUS | Status: DC

## 2017-06-18 MED ORDER — POTASSIUM CHLORIDE CRYS ER 20 MEQ PO TBCR
40.0000 meq | EXTENDED_RELEASE_TABLET | Freq: Once | ORAL | Status: AC
  Administered 2017-06-18: 09:00:00 40 meq via ORAL
  Filled 2017-06-18: qty 2

## 2017-06-18 MED ORDER — POTASSIUM CHLORIDE ER 20 MEQ PO TBCR: 20.0000 meq | EXTENDED_RELEASE_TABLET | Freq: Every day | ORAL | 0 refills | Status: DC

## 2017-06-18 MED ORDER — MAGNESIUM OXIDE 400 (241.3 MG) MG PO TABS
800.0000 mg | ORAL_TABLET | Freq: Once | ORAL | Status: AC
  Administered 2017-06-18: 15:00:00 800 mg via ORAL
  Filled 2017-06-18: qty 2

## 2017-06-18 MED ORDER — MAGNESIUM SULFATE 2 GM/50ML IV SOLN
2.0000 g | Freq: Once | INTRAVENOUS | Status: AC
Start: 2017-06-18 — End: 2017-06-18
  Administered 2017-06-18: 2 g via INTRAVENOUS
  Filled 2017-06-18: qty 50

## 2017-06-18 MED ORDER — POTASSIUM CHLORIDE CRYS ER 20 MEQ PO TBCR
40.0000 meq | EXTENDED_RELEASE_TABLET | Freq: Once | ORAL | Status: AC
  Administered 2017-06-18: 40 meq via ORAL
  Filled 2017-06-18: qty 2

## 2017-06-18 MED ORDER — MAGNESIUM OXIDE 400 (241.3 MG) MG PO TABS: 400.0000 mg | ORAL_TABLET | Freq: Every day | ORAL | 0 refills | Status: DC

## 2017-06-18 NOTE — Progress Notes (Signed)
Potassium 2.4. Dr. Estanislado Pandy notified, see new orders.

## 2017-06-18 NOTE — Discharge Instructions (Signed)
QUIT BEER/ALCOHOL  Regular   Activity as tolerated

## 2017-06-18 NOTE — Telephone Encounter (Signed)
LMOM for Austin Patterson to return my call. Called pt and informed him I had been trying to get in touch with Austin Patterson. He states he will start the infusion back next week. Nothing further needed.

## 2017-06-18 NOTE — Progress Notes (Signed)
Discussed discharge instructions and medications with patient. IV removed. All questions addressed. Patient transported home via car by his friend.  Clarise Cruz, RN

## 2017-06-18 NOTE — Telephone Encounter (Signed)
LMOM for Juanita to return my call.

## 2017-06-18 NOTE — Progress Notes (Signed)
Enoxaparin 30 mg SQ daily. Increased to enoxaparin 40 mg SQ daily; CrCl>98ml/min and weight 81.6 kg.   [place] Resident

## 2017-06-19 LAB — HIV ANTIBODY (ROUTINE TESTING W REFLEX): HIV Screen 4th Generation wRfx: NONREACTIVE

## 2017-06-22 NOTE — Discharge Summary (Signed)
[facility] NAME: Austin Patterson    MR#:  [id number]  DATE OF BIRTH:  December 11, 1955  DATE OF ADMISSION:  06/17/2017 ADMITTING PHYSICIAN: Nicholes Mango, MD  DATE OF DISCHARGE: 06/18/2017  3:56 PM  PRIMARY CARE PHYSICIAN: Lavonne Chick, MD   ADMISSION DIAGNOSIS:  Hyponatremia [E87.1] Weakness [R53.1] Acute renal insufficiency [N28.9] Upper abdominal pain [R10.10] Nausea vomiting and diarrhea [R11.2, R19.7] Pain of upper abdomen [R10.10]  DISCHARGE DIAGNOSIS:  Active Problems:   Hyponatremia   SECONDARY DIAGNOSIS:   Past Medical History:  Diagnosis Date  . COPD (chronic obstructive pulmonary disease) ([place])   . Hyperlipidemia   . Hypertension   . Pulmonary fibrosis ([place]) 11/2015     ADMITTING HISTORY  CHIEF COMPLAINT:  Nausea vomiting, epigastric abdominal pain  HISTORY OF PRESENT ILLNESS:  Austin Patterson  is a 61 y.o. male with a known history of COPD, hyperlipidemia, hypertension, pulmonary fibrosis and alcohol abuse is presenting to the ED with a chief complaint of intractable nausea and vomiting for 1 week associated with epigastric abdominal pain. Denies any diarrhea. Lipase is elevated and sodium is at 127. Abdominal ultrasound with no acute abnormalities. Patient was given IV fluids and hospitalist team is called to admit the patient. Patient admits drinking on: Daily basis and he drinks 6 cans of beer, patient was treated today during my examination his last drink was 2-3 days ago    HOSPITAL COURSE:   * Acute alcoholic pancreatitis and gastritis Treated symptomatically with pain meds and nausea medications Patient improved well and by day of discharge he tolerated food. No pain or vomiting  Hypokalemia and Hypomagnesemia replaced thru IV and PO. prescriptions given at discharge for supplements.  Counseled to quit alcohol  stable for discharge home  CONSULTS OBTAINED:    DRUG ALLERGIES:  No Known  Allergies  DISCHARGE MEDICATIONS:   Discharge Medication List as of 06/18/2017  3:31 PM    START taking these medications   Details  magnesium oxide (MAG-OX) 400 (241.3 Mg) MG tablet Take 1 tablet (400 mg total) by mouth daily., Starting Fri 06/18/2017, Normal    potassium chloride 20 MEQ TBCR Take 20 mEq by mouth daily., Starting Fri 06/18/2017, Normal      CONTINUE these medications which have NOT CHANGED   Details  albuterol (PROVENTIL HFA;VENTOLIN HFA) 108 (90 Base) MCG/ACT inhaler Inhale 2 puffs into the lungs every 6 (six) hours as needed for wheezing or shortness of breath., Starting Fri 01/29/2017, Print    amLODipine (NORVASC) 10 MG tablet Take 1 tablet (10 mg total) by mouth daily., Starting Wed 08/19/2016, Normal    aspirin EC 81 MG tablet Take 1 tablet (81 mg total) by mouth daily., Starting Wed 08/19/2016, Normal    atorvastatin (LIPITOR) 40 MG tablet Take 1 tablet (40 mg total) by mouth at bedtime., Starting Wed 08/19/2016, Normal    calcium-vitamin D (OSCAL WITH D) 500-200 MG-UNIT tablet Take 2 tablets by mouth daily with breakfast., Starting Thu 09/24/2016, Normal    clopidogrel (PLAVIX) 75 MG tablet Take 1 tablet (75 mg total) by mouth daily., Starting Wed 08/19/2016, Normal    losartan-hydrochlorothiazide (HYZAAR) 100-12.5 MG tablet Take 1 tablet by mouth daily., Starting Wed 08/19/2016, Normal    metoprolol succinate (TOPROL-XL) 50 MG 24 hr tablet Take 1 tablet (50 mg total) by mouth daily., Starting Wed 08/19/2016, Normal    Multiple Vitamin (MULTIVITAMIN WITH MINERALS) TABS tablet Take 1 tablet by mouth daily., Starting Thu  09/24/2016, Normal    pantoprazole (PROTONIX) 40 MG tablet Take 1 tablet (40 mg total) by mouth 2 (two) times daily., Starting Wed 09/23/2016, Normal    umeclidinium-vilanterol (ANORO ELLIPTA) 62.5-25 MCG/INH AEPB INHALE ONE PUFF INTO THE LUNGS DAILY, Normal    cetirizine (ZYRTEC) 10 MG tablet Take 10 mg by mouth at bedtime., Starting Wed 08/19/2016,  Historical Med    Spacer/Aero Chamber Mouthpiece MISC 1 each by Does not apply route every 4 (four) hours as needed (wheezing)., Starting Fri 01/29/2017, Print        Today   VITAL SIGNS:  Blood pressure 107/66, pulse 95, temperature 97.9 F (36.6 C), temperature source Oral, resp. rate 16, height 5\' 8"  (1.727 m), weight 81.6 kg (180 lb), SpO2 98 %.  I/O:  No intake or output data in the 24 hours ending 06/22/17 1708  PHYSICAL EXAMINATION:  Physical Exam  GENERAL:  61 y.o.-year-old patient lying in the bed with no acute distress.  LUNGS: Normal breath sounds bilaterally, no wheezing, rales,rhonchi or crepitation. No use of accessory muscles of respiration.  CARDIOVASCULAR: S1, S2 normal. No murmurs, rubs, or gallops.  ABDOMEN: Soft, non-tender, non-distended. Bowel sounds present. No organomegaly or mass.  NEUROLOGIC: Moves all 4 extremities. PSYCHIATRIC: The patient is alert and oriented x 3.  SKIN: No obvious rash, lesion, or ulcer.   DATA REVIEW:   CBC  Recent Labs Lab 06/18/17 0216  WBC 6.3  HGB 12.0*  HCT 33.6*  PLT 225    Chemistries   Recent Labs Lab 06/18/17 0216 06/18/17 1209  NA 134*  --   K 2.4* 4.0  CL 98*  --   CO2 26  --   GLUCOSE 95  --   BUN 13  --   CREATININE 0.90  --   CALCIUM 7.4*  --   MG  --  1.1*  AST 146*  --   ALT 53  --   ALKPHOS 35*  --   BILITOT 2.0*  --     Cardiac Enzymes  Recent Labs Lab 06/17/17 1242  TROPONINI <0.03    Microbiology Results  Results for orders placed or performed during the hospital encounter of 12/09/15  Blood Culture (routine x 2)     Status: None   Collection Time: 12/09/15  2:03 PM  Result Value Ref Range Status   Specimen Description BLOOD LEFT WRIST  Final   Special Requests   Final    BOTTLES DRAWN AEROBIC AND ANAEROBIC AER 4ML ANA 3ML   Culture NO GROWTH 5 DAYS  Final   Report Status 12/14/2015 FINAL  Final  Blood Culture (routine x 2)     Status: None   Collection Time: 12/09/15   2:03 PM  Result Value Ref Range Status   Specimen Description BLOOD RIGHT AC  Final   Special Requests   Final    BOTTLES DRAWN AEROBIC AND ANAEROBIC AER 2ML ANA 0.5ML   Culture NO GROWTH 5 DAYS  Final   Report Status 12/14/2015 FINAL  Final  Urine culture     Status: None   Collection Time: 12/09/15  2:39 PM  Result Value Ref Range Status   Specimen Description URINE, RANDOM  Final   Special Requests NONE  Final   Culture NO GROWTH 1 DAY  Final   Report Status 12/11/2015 FINAL  Final  Rapid Influenza A&B Antigens ([place] only)     Status: None   Collection Time: 12/09/15  3:34 PM  Result Value Ref Range Status  Influenza A [facility]) NOT DETECTED  Final   Influenza B [facility]) NOT DETECTED  Final    RADIOLOGY:  No results found.  Follow up with PCP in 1 week.  Management plans discussed with the patient, family and they are in agreement.  CODE STATUS:  Code Status History    Date Active Date Inactive Code Status Order ID Comments User Context   06/17/2017  6:14 PM 06/18/2017  7:01 PM Full Code [id number]  Nicholes Mango, MD Inpatient   03/31/2017  7:24 PM 04/01/2017  2:26 PM Full Code [id number]  Fritzi Mandes, MD ED   09/21/2016  9:14 AM 09/23/2016  7:02 PM Full Code [id number]  Fritzi Mandes, MD Inpatient   12/09/2015  5:05 PM 12/16/2015  6:03 PM Full Code [id number]  Bettey Costa, MD Inpatient   10/14/2015  1:42 PM 10/16/2015  4:29 PM Full Code [id number]  Nicholes Mango, MD Inpatient      TOTAL TIME TAKING CARE OF THIS PATIENT ON DAY OF DISCHARGE: more than 30 minutes.   Hillary Bow R M.D on 06/22/2017 at 5:08 PM  Between 7am to 6pm - Pager - [phone]  After 6pm go to [email or web address] - password [email or web address] [facility]  Office  [phone]  CC: Primary care physician; Lavonne Chick, MD  Note: This dictation was prepared with Dragon dictation along with smaller phrase technology. Any transcriptional errors that result from this process are unintentional.

## 2017-07-15 ENCOUNTER — Telehealth: Payer: Self-pay | Admitting: Internal Medicine

## 2017-07-15 NOTE — Telephone Encounter (Signed)
Patient calling about home infusion for Zamara  He had to switch insurance to [place] as he lost his medicaid and they have denied this medication   Please call to discuss options

## 2017-07-15 NOTE — Telephone Encounter (Signed)
Appeals form printed from [employer] completed and placed in MD folder for signature.

## 2017-08-04 ENCOUNTER — Telehealth: Payer: Self-pay | Admitting: *Deleted

## 2017-08-04 ENCOUNTER — Telehealth: Payer: Self-pay | Admitting: Internal Medicine

## 2017-08-04 NOTE — Telephone Encounter (Signed)
Patient dropped of consent appeal has been faxed.

## 2017-08-04 NOTE — Telephone Encounter (Signed)
Started PA process on Zemaira for patient. PA was denied therefore submitted an appeal. Appeal was rejected b/c patient's plan requires his consent. A consent form was mailed to patient from [facility]. Called patient and explained process and asked if he would drop form off after signing. After form received appeal will be refaxed. Nothing further needed at this time.

## 2017-08-04 NOTE — Telephone Encounter (Signed)
PT dropped off signed paperwork from [facility] for an [place] in Nurse Box

## 2017-08-11 NOTE — Telephone Encounter (Signed)
Returned call and spoke with Joellen Jersey. PA is in appeals process which was initiated for the 2nd time last week. She will reach out to [facility] to f/u . Nothing further needed at this time.

## 2017-08-11 NOTE — Telephone Encounter (Signed)
Drug company calling to check on status of Prior Auth

## 2017-08-17 ENCOUNTER — Telehealth: Payer: Self-pay | Admitting: *Deleted

## 2017-08-17 NOTE — Telephone Encounter (Signed)
Called to check the status of Austin Patterson appeal #[id number] Ref # [id number] Spoke with Elonda Husky 9:00am this morning. It is currently in appeals process and has 17 more days left. The process takes up to 30 days. A approval/denial letter will be mailed to patient and faxed to the office. Nothing further needed.

## 2017-08-18 ENCOUNTER — Telehealth: Payer: Self-pay | Admitting: Internal Medicine

## 2017-08-18 NOTE — Telephone Encounter (Signed)
Returned call to Levada Dy in reference to appeal for patient Austin Patterson. Appeal was submitted 14 days ago today. She has been made aware [employer] has up to 30 to process appeal. Nothing further needed.

## 2017-08-18 NOTE — Telephone Encounter (Signed)
Levada Dy calling from [facility] stating they fill patient's Alfa 1 solution  They are calling to see if we are going to do an appeal on this For it was denied by insurance   Please call back

## 2017-08-31 ENCOUNTER — Telehealth: Payer: Self-pay | Admitting: Internal Medicine

## 2017-08-31 NOTE — Telephone Encounter (Signed)
Returned call to [facility] and noted that start of therapy ws 06/2017 thru 05/2017 when insurance changed. Also was asked if any labwork was done to show improvement while on therapy. Answer NO. Per Dr. Juanell Fairly Nothing further needed at this time.

## 2017-08-31 NOTE — Telephone Encounter (Signed)
[employer] is calling regarding Aralast. They need to know how long pt has been on this medication

## 2017-09-17 ENCOUNTER — Telehealth: Payer: Self-pay | Admitting: *Deleted

## 2017-09-17 ENCOUNTER — Telehealth: Payer: Self-pay | Admitting: Internal Medicine

## 2017-09-17 NOTE — Telephone Encounter (Signed)
Levada Dy called back with up-date [facility] just received approval as well. Patient approved from 09/03/17--09/03/18. Patient also approved for nursing. Patient will be contacted [facility]) with all information once processed. I will contact patient with this info.

## 2017-09-17 NOTE — Progress Notes (Addendum)
* [facility]     Assessment and Plan: 61 year old male with bullous emphysema, interstitial lung disease, chronic respiratory failure.   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 - advancing interstitial lung disease, discussed the diagnosis of pulmonary fibrosis, as well as its progressive nature. 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  -Now off prednisone.   --Patient does not want to restart Esbriet because of potential for liver issues.   COPD/Bullous emphysema.  Severe bullous emphysema, significantly advanced on most recent CT chest over the last several months. - Maintain O2 saturations greater than 88% - Anoro was too expensive.  Patient was given samples of Breo inhaler today, and prescription for generic Advair. -Maintain off of prednisone. -Continue alpha 1 augmentation infusions.  Alpha-1 antitrypsin deficiency. -The patient's phenotype is MZ.   Acute hypoxic respiratory failure-chronic.  -Secondary to above, continue oxygen.    Nicotine abuse. -He quit smoking about 2 year ago.   Date: 09/17/2017  MRN# [id number] Austin Patterson 1956-01-21   Austin Patterson is a 61 y.o. old male seen in follow up for chief complaint of  Chief Complaint  Patient presents with  . Follow-up    breathing worse since not having infusion:     HPI:   The patient is a 61 year old male with a history advanced COPD, interstitial lung disease.  He has been off the infusions because he was no longer on medicaid, he found that he felt better when he was taking the infusions.  Anoro ran out 2 months ago because it was no longer covered. He is taking proair 3 or 4 times per day, and nebulizer once or twice per day. The Anoro helped the most.  He feels that his breathing is not doing as well as when he was doing the infusions.  Overall however he is looking better today than he was at his last visit. He is on  disability for the past year, he is 2L oxygen.   He was previously on prednisone but is now weaned off.   He was taking esbriet 3 tabs 3 times per day but he is no longer on it as they stopped covering it.  He does not want to restart   An alpha-1 antitrypsin serum level was 158. 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   Pulmonary function testing 01/29/16: Spirometry FEV1 was 1.69 L which is 52% of predicted, there was no significant improvement with bronchodilator therapy. The FVC was 61% of predicted, the FEV to FVC ratio was 65%. Lung volumes: Residual volume was 60% of predicted, TLC was 59% of predicted, RV/TLC ratio was 112% of predicted. Diffusion capacity: 26% of predicted. Interpretation. 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   CT chest 01/29/16: continued severe interstitial changes throughout both lungs, worst in the upper lobes. The previously seen pneumonic changes from 12/11/15 in the left base and to a lesser degree in the right base have improved. significant bullous emphysema as well as advancing interstitial changes seen onCT chest 12/11/15, in particular his emphysematous changes on the CT appeared to have advanced significantly since the previous CT, which was 8 months prior.    Medication:   Outpatient Encounter Medications as of 09/20/2017  Medication Sig  . albuterol (PROVENTIL HFA;VENTOLIN HFA) 108 (90 Base) MCG/ACT inhaler Inhale 2 puffs into the lungs every 6 (six) hours  as needed for wheezing or shortness of breath.  [name] amLODipine (NORVASC) 10 MG tablet Take 1 tablet (10 mg total) by mouth daily.  [name] aspirin EC 81 MG tablet Take 1 tablet (81 mg total) by mouth daily.  [name] atorvastatin (LIPITOR) 40 MG tablet Take 1 tablet (40 mg total) by mouth at bedtime.  . calcium-vitamin D (OSCAL WITH D) 500-200 MG-UNIT tablet Take 2  tablets by mouth daily with breakfast.  . cetirizine (ZYRTEC) 10 MG tablet Take 10 mg by mouth at bedtime.  . clopidogrel (PLAVIX) 75 MG tablet Take 1 tablet (75 mg total) by mouth daily.  [name] losartan-hydrochlorothiazide (HYZAAR) 100-12.5 MG tablet Take 1 tablet by mouth daily.  . magnesium oxide (MAG-OX) 400 (241.3 Mg) MG tablet Take 1 tablet (400 mg total) by mouth daily.  . metoprolol succinate (TOPROL-XL) 50 MG 24 hr tablet Take 1 tablet (50 mg total) by mouth daily.  . Multiple Vitamin (MULTIVITAMIN WITH MINERALS) TABS tablet Take 1 tablet by mouth daily.  . pantoprazole (PROTONIX) 40 MG tablet Take 1 tablet (40 mg total) by mouth 2 (two) times daily.  . potassium chloride 20 MEQ TBCR Take 20 mEq by mouth daily.  [name] Spacer/Aero Chamber Mouthpiece MISC 1 each by Does not apply route every 4 (four) hours as needed (wheezing).  [name] umeclidinium-vilanterol (ANORO ELLIPTA) 62.5-25 MCG/INH AEPB INHALE ONE PUFF INTO THE LUNGS DAILY   No facility-administered encounter medications on file as of 09/20/2017.      Allergies:  Patient has no known allergies.  Review of Systems: Gen:  Denies  fever, sweats. HEENT: Denies blurred vision. Cvc:  No dizziness, chest pain or heaviness Resp:   Denies cough or sputum porduction. Gi: Denies swallowing difficulty, stomach pain. constipation Gu:  Denies bladder incontinence, burning urine Ext:   No Joint pain, stiffness. Skin: No skin rash, easy bruising. Endoc:  No polyuria, polydipsia. Psych: No depression, insomnia. Other:  All other systems were reviewed and found to be negative other than what is mentioned in the HPI.   Physical Examination:   VS: There were no vitals taken for this visit.  General Appearance: No distress  Neuro:without focal findings,   HEENT: PERRLA, EOM intact. Pulmonary: scattered crackles decreased breath sounds bilaterally.  CardiovascularNormal S1,S2.  No m/r/g.   Abdomen: Benign, Soft, non-tender. Renal:  No  costovertebral tenderness  GU:  Not performed at this time. Endoc: No evident thyromegaly,  Skin:   warm, no rash. Extremities: normal, no cyanosis, clubbing.   LABORATORY PANEL:   CBC No results for input(s): WBC, HGB, HCT, PLT in the last 168 hours. ------------------------------------------------------------------------------------------------------------------  Chemistries  No results for input(s): NA, K, CL, CO2, GLUCOSE, BUN, CREATININE, CALCIUM, MG, AST, ALT, ALKPHOS, BILITOT in the last 168 hours.  Invalid input(s): GFRCGP ------------------------------------------------------------------------------------------------------------------  Cardiac Enzymes No results for input(s): TROPONINI in the last 168 hours. ------------------------------------------------------------  RADIOLOGY:   No results found for this or any previous visit. Results for orders placed during the hospital encounter of 09/03/15  DG Chest 2 View   Narrative CLINICAL DATA:  Chest pain for 1 month.  EXAM: CHEST  2 VIEW  COMPARISON:  01/23/2015  FINDINGS: Lungs are hyperaerated and clear, other than scarring at the right lateral base. Normal heart size. No pneumothorax. Chronic right rib deformities. Stable thoracic spine.  IMPRESSION: No active cardiopulmonary disease.   Electronically Signed   By: Marybelle Killings M.D.   On: 09/03/2015 07:56    ------------------------------------------------------------------------------------------------------------------  Thank  you for allowing  [facility] to assist in the care of your patient. Our recommendations are noted above.  Please contact us if we can be of further service.   Marda Stalker, MD.  [facility]  Patricia Pesa, M.D.  Merton Border, M.D  09/17/2017

## 2017-09-17 NOTE — Telephone Encounter (Signed)
Pt aware of status Aralast. Patient has f/u on 12/3.

## 2017-09-17 NOTE — Telephone Encounter (Signed)
Pt wife calling stating they were on the phone with [facility] and they are now approved for patient to get in home effusions for Pulmonary fibrosis    Wanted to know if we can go ahead and start this for patient   Please call

## 2017-09-17 NOTE — Telephone Encounter (Signed)
Called to check status of [facility] states due to shortage of Marinell Blight they did get a written signature to switch medication on 09/13/17. Levada Dy is call [facility] to find out what medication has been changed to and when shipment date is. She will call office to f/u with that information.

## 2017-09-20 ENCOUNTER — Encounter: Payer: Self-pay | Admitting: Internal Medicine

## 2017-09-20 ENCOUNTER — Ambulatory Visit (INDEPENDENT_AMBULATORY_CARE_PROVIDER_SITE_OTHER): Payer: BLUE CROSS/BLUE SHIELD | Admitting: Internal Medicine

## 2017-09-20 VITALS — BP 118/68 | HR 110 | Ht 68.0 in | Wt 191.0 lb

## 2017-09-20 DIAGNOSIS — J438 Other emphysema: Secondary | ICD-10-CM | POA: Diagnosis not present

## 2017-09-20 MED ORDER — FLUTICASONE-SALMETEROL 113-14 MCG/ACT IN AEPB: 2.0000 | INHALATION_SPRAY | Freq: Two times a day (BID) | RESPIRATORY_TRACT | 5 refills | Status: DC

## 2017-09-20 NOTE — Patient Instructions (Signed)
Continue current medications.  Will see if you can get back on Esbriet.

## 2017-12-01 ENCOUNTER — Telehealth: Payer: Self-pay | Admitting: Internal Medicine

## 2017-12-01 NOTE — Telephone Encounter (Signed)
Patient needs a note to excuse him from  jury duty 2 -18-19 please call

## 2017-12-01 NOTE — Telephone Encounter (Signed)
Can we do a note for pt to be excused from jury duty?

## 2017-12-01 NOTE — Telephone Encounter (Signed)
Patient checking in to see if letter is ready

## 2017-12-01 NOTE — Telephone Encounter (Signed)
I would need a reason to place in the letter to excuse him from jury duty.

## 2017-12-02 ENCOUNTER — Encounter: Payer: Self-pay | Admitting: Internal Medicine

## 2017-12-02 NOTE — Telephone Encounter (Signed)
Pt states he is on oxygen at 3L.

## 2017-12-02 NOTE — Progress Notes (Signed)
Mr. [place] requires a high amount of oxygen at all times which may make it difficult for him to attend jury duty, please excuse him from jury duty.  Please contact my office if you have any questions.   Sincerely,    Laverle Hobby, M.D.

## 2017-12-02 NOTE — Telephone Encounter (Signed)
Letter placed in chart, please print on letterhead for patient.

## 2017-12-03 ENCOUNTER — Encounter: Payer: Self-pay | Admitting: Internal Medicine

## 2017-12-03 NOTE — Telephone Encounter (Signed)
Letter printed by DR. Pt informed. Nothing further needed.

## 2017-12-20 ENCOUNTER — Ambulatory Visit: Payer: BLUE CROSS/BLUE SHIELD | Admitting: Internal Medicine

## 2017-12-20 NOTE — Progress Notes (Deleted)
* [facility]     Assessment and Plan: 62 year old male with bullous emphysema, interstitial lung disease, chronic respiratory failure.   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 - advancing interstitial lung disease, discussed the diagnosis of pulmonary fibrosis, as well as its progressive nature. 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  -Now off prednisone.   --Patient does not want to restart Esbriet because of potential for liver issues.   COPD/Bullous emphysema.  Severe bullous emphysema, significantly advanced on most recent CT chest over the last several months. - Maintain O2 saturations greater than 88% - Anoro was too expensive.  Patient was given samples of Breo inhaler today, and prescription for generic Advair. -Maintain off of prednisone. -Continue alpha 1 augmentation infusions.  Alpha-1 antitrypsin deficiency. -The patient's phenotype is MZ.   Acute hypoxic respiratory failure-chronic.  -Secondary to above, continue oxygen.    Nicotine abuse. -He quit smoking about 2 year ago.   Date: 12/20/2017  MRN# [id number] Austin Patterson 12-07-1955   Austin Patterson is a 62 y.o. old male seen in follow up for chief complaint of  No chief complaint on file.    HPI:   The patient is a 62 year old male with a history advanced COPD, interstitial lung disease.  He has been off the infusions because he was no longer on medicaid, he found that he felt better when he was taking the infusions.  Anoro ran out 2 months ago because it was no longer covered. He is taking proair 3 or 4 times per day, and nebulizer once or twice per day. The Anoro helped the most.  He feels that his breathing is not doing as well as when he was doing the infusions.  Overall however he is looking better today than he was at his last visit. He is on disability for the past year, he is 2L oxygen.   He was previously on prednisone  but is now weaned off.   He was taking esbriet 3 tabs 3 times per day but he is no longer on it as they stopped covering it.  He does not want to restart   An alpha-1 antitrypsin serum level was 158. 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   Pulmonary function testing 01/29/16: Spirometry FEV1 was 1.69 L which is 52% of predicted, there was no significant improvement with bronchodilator therapy. The FVC was 61% of predicted, the FEV to FVC ratio was 65%. Lung volumes: Residual volume was 60% of predicted, TLC was 59% of predicted, RV/TLC ratio was 112% of predicted. Diffusion capacity: 26% of predicted. Interpretation. 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   CT chest 01/29/16: continued severe interstitial changes throughout both lungs, worst in the upper lobes. The previously seen pneumonic changes from 12/11/15 in the left base and to a lesser degree in the right base have improved. significant bullous emphysema as well as advancing interstitial changes seen onCT chest 12/11/15, in particular his emphysematous changes on the CT appeared to have advanced significantly since the previous CT, which was 8 months prior.    Medication:   Outpatient Encounter Medications as of 12/20/2017  Medication Sig  . albuterol (PROVENTIL HFA;VENTOLIN HFA) 108 (90 Base) MCG/ACT inhaler Inhale 2 puffs into the lungs every 6 (six) hours as needed for wheezing or shortness of breath.  [name] amLODipine (NORVASC) 10 MG  tablet Take 1 tablet (10 mg total) by mouth daily.  [name] aspirin EC 81 MG tablet Take 1 tablet (81 mg total) by mouth daily.  [name] atorvastatin (LIPITOR) 40 MG tablet Take 1 tablet (40 mg total) by mouth at bedtime.  . calcium-vitamin D (OSCAL WITH D) 500-200 MG-UNIT tablet Take 2 tablets by mouth daily with breakfast.  . cetirizine (ZYRTEC) 10 MG tablet Take 10 mg by  mouth at bedtime.  . clopidogrel (PLAVIX) 75 MG tablet Take 1 tablet (75 mg total) by mouth daily.  . Fluticasone-Salmeterol 113-14 MCG/ACT AEPB Inhale 2 puffs into the lungs 2 (two) times daily. Rinse mouth out after use.  . losartan-hydrochlorothiazide (HYZAAR) 100-12.5 MG tablet Take 1 tablet by mouth daily.  . magnesium oxide (MAG-OX) 400 (241.3 Mg) MG tablet Take 1 tablet (400 mg total) by mouth daily.  . metoprolol succinate (TOPROL-XL) 50 MG 24 hr tablet Take 1 tablet (50 mg total) by mouth daily.  . Multiple Vitamin (MULTIVITAMIN WITH MINERALS) TABS tablet Take 1 tablet by mouth daily.  . pantoprazole (PROTONIX) 40 MG tablet Take 1 tablet (40 mg total) by mouth 2 (two) times daily.  . potassium chloride 20 MEQ TBCR Take 20 mEq by mouth daily.  [name] Spacer/Aero Chamber Mouthpiece MISC 1 each by Does not apply route every 4 (four) hours as needed (wheezing).  [name] umeclidinium-vilanterol (ANORO ELLIPTA) 62.5-25 MCG/INH AEPB INHALE ONE PUFF INTO THE LUNGS DAILY   No facility-administered encounter medications on file as of 12/20/2017.      Allergies:  Patient has no known allergies.  Review of Systems: Gen:  Denies  fever, sweats. HEENT: Denies blurred vision. Cvc:  No dizziness, chest pain or heaviness Resp:   Denies cough or sputum porduction. Gi: Denies swallowing difficulty, stomach pain. constipation Gu:  Denies bladder incontinence, burning urine Ext:   No Joint pain, stiffness. Skin: No skin rash, easy bruising. Endoc:  No polyuria, polydipsia. Psych: No depression, insomnia. Other:  All other systems were reviewed and found to be negative other than what is mentioned in the HPI.   Physical Examination:   VS: There were no vitals taken for this visit.  General Appearance: No distress  Neuro:without focal findings,   HEENT: PERRLA, EOM intact. Pulmonary: scattered crackles decreased breath sounds bilaterally.  CardiovascularNormal S1,S2.  No m/r/g.   Abdomen: Benign, Soft,  non-tender. Renal:  No costovertebral tenderness  GU:  Not performed at this time. Endoc: No evident thyromegaly,  Skin:   warm, no rash. Extremities: normal, no cyanosis, clubbing.   LABORATORY PANEL:   CBC No results for input(s): WBC, HGB, HCT, PLT in the last 168 hours. ------------------------------------------------------------------------------------------------------------------  Chemistries  No results for input(s): NA, K, CL, CO2, GLUCOSE, BUN, CREATININE, CALCIUM, MG, AST, ALT, ALKPHOS, BILITOT in the last 168 hours.  Invalid input(s): GFRCGP ------------------------------------------------------------------------------------------------------------------  Cardiac Enzymes No results for input(s): TROPONINI in the last 168 hours. ------------------------------------------------------------  RADIOLOGY:   No results found for this or any previous visit. Results for orders placed during the hospital encounter of 09/03/15  DG Chest 2 View   Narrative CLINICAL DATA:  Chest pain for 1 month.  EXAM: CHEST  2 VIEW  COMPARISON:  01/23/2015  FINDINGS: Lungs are hyperaerated and clear, other than scarring at the right lateral base. Normal heart size. No pneumothorax. Chronic right rib deformities. Stable thoracic spine.  IMPRESSION: No active cardiopulmonary disease.   Electronically Signed   By: Marybelle Killings M.D.   On: 09/03/2015 07:56    ------------------------------------------------------------------------------------------------------------------  Thank  you for allowing [facility] to assist in the care of your patient. Our recommendations are noted above.  Please contact us if we can be of further service.   Marda Stalker, MD.  [facility]  Patricia Pesa, M.D.  Merton Border, M.D  12/20/2017 * [facility]     Assessment and Plan: 62 year old male with bullous emphysema,  interstitial lung disease, chronic respiratory failure.   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 - advancing interstitial lung disease, discussed the diagnosis of pulmonary fibrosis, as well as its progressive nature. 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  -Now off prednisone.   --Patient does not want to restart Esbriet because of potential for liver issues.   COPD/Bullous emphysema.  Severe bullous emphysema, significantly advanced on most recent CT chest over the last several months. - Maintain O2 saturations greater than 88% - Anoro was too expensive.  Patient was given samples of Breo inhaler today, and prescription for generic Advair. -Maintain off of prednisone. -Continue alpha 1 augmentation infusions.  Alpha-1 antitrypsin deficiency. -The patient's phenotype is MZ.   Acute hypoxic respiratory failure-chronic.  -Secondary to above, continue oxygen.    Nicotine abuse. -He quit smoking about 2 year ago.   Date: 12/20/2017  MRN# [id number] Austin Patterson 1956-04-07   Austin Patterson is a 62 y.o. old male seen in follow up for chief complaint of  No chief complaint on file.    HPI:   The patient is a 62 year old male with a history advanced COPD, interstitial lung disease.  He has been off the infusions because he was no longer on medicaid, he found that he felt better when he was taking the infusions.  Anoro ran out 2 months ago because it was no longer covered. He is taking proair 3 or 4 times per day, and nebulizer once or twice per day. The Anoro helped the most.  He feels that his breathing is not doing as well as when he was doing the infusions.  Overall however he is looking better today than he was at his last visit. He is on disability for the past year, he is 2L oxygen.   He was previously on prednisone but is now weaned off.   He was taking esbriet 3 tabs 3 times per day but he is no longer on it as they  stopped covering it.  He does not want to restart   An alpha-1 antitrypsin serum level was 158. 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   Pulmonary function testing 01/29/16: Spirometry FEV1 was 1.69 L which is 52% of predicted, there was no significant improvement with bronchodilator therapy. The FVC was 61% of predicted, the FEV to FVC ratio was 65%. Lung volumes: Residual volume was 60% of predicted, TLC was 59% of predicted, RV/TLC ratio was 112% of predicted. Diffusion capacity: 26% of predicted. Interpretation. 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   CT chest 01/29/16: continued severe interstitial changes throughout both lungs, worst in the upper lobes. The previously seen pneumonic changes from 12/11/15 in the left base and to a lesser degree in the right base have improved. significant bullous emphysema as well as advancing interstitial changes seen onCT chest 12/11/15, in particular his emphysematous changes on the CT appeared to have advanced significantly since the previous  CT, which was 8 months prior.    Medication:   Outpatient Encounter Medications as of 12/20/2017  Medication Sig  . albuterol (PROVENTIL HFA;VENTOLIN HFA) 108 (90 Base) MCG/ACT inhaler Inhale 2 puffs into the lungs every 6 (six) hours as needed for wheezing or shortness of breath.  [name] amLODipine (NORVASC) 10 MG tablet Take 1 tablet (10 mg total) by mouth daily.  [name] aspirin EC 81 MG tablet Take 1 tablet (81 mg total) by mouth daily.  [name] atorvastatin (LIPITOR) 40 MG tablet Take 1 tablet (40 mg total) by mouth at bedtime.  . calcium-vitamin D (OSCAL WITH D) 500-200 MG-UNIT tablet Take 2 tablets by mouth daily with breakfast.  . cetirizine (ZYRTEC) 10 MG tablet Take 10 mg by mouth at bedtime.  . clopidogrel (PLAVIX) 75 MG tablet Take 1 tablet (75 mg total) by mouth daily.  .  Fluticasone-Salmeterol 113-14 MCG/ACT AEPB Inhale 2 puffs into the lungs 2 (two) times daily. Rinse mouth out after use.  . losartan-hydrochlorothiazide (HYZAAR) 100-12.5 MG tablet Take 1 tablet by mouth daily.  . magnesium oxide (MAG-OX) 400 (241.3 Mg) MG tablet Take 1 tablet (400 mg total) by mouth daily.  . metoprolol succinate (TOPROL-XL) 50 MG 24 hr tablet Take 1 tablet (50 mg total) by mouth daily.  . Multiple Vitamin (MULTIVITAMIN WITH MINERALS) TABS tablet Take 1 tablet by mouth daily.  . pantoprazole (PROTONIX) 40 MG tablet Take 1 tablet (40 mg total) by mouth 2 (two) times daily.  . potassium chloride 20 MEQ TBCR Take 20 mEq by mouth daily.  [name] Spacer/Aero Chamber Mouthpiece MISC 1 each by Does not apply route every 4 (four) hours as needed (wheezing).  [name] umeclidinium-vilanterol (ANORO ELLIPTA) 62.5-25 MCG/INH AEPB INHALE ONE PUFF INTO THE LUNGS DAILY   No facility-administered encounter medications on file as of 12/20/2017.      Allergies:  Patient has no known allergies.  Review of Systems: Gen:  Denies  fever, sweats. HEENT: Denies blurred vision. Cvc:  No dizziness, chest pain or heaviness Resp:   Denies cough or sputum porduction. Gi: Denies swallowing difficulty, stomach pain. constipation Gu:  Denies bladder incontinence, burning urine Ext:   No Joint pain, stiffness. Skin: No skin rash, easy bruising. Endoc:  No polyuria, polydipsia. Psych: No depression, insomnia. Other:  All other systems were reviewed and found to be negative other than what is mentioned in the HPI.   Physical Examination:   VS: There were no vitals taken for this visit.  General Appearance: No distress  Neuro:without focal findings,   HEENT: PERRLA, EOM intact. Pulmonary: scattered crackles decreased breath sounds bilaterally.  CardiovascularNormal S1,S2.  No m/r/g.   Abdomen: Benign, Soft, non-tender. Renal:  No costovertebral tenderness  GU:  Not performed at this time. Endoc: No evident  thyromegaly,  Skin:   warm, no rash. Extremities: normal, no cyanosis, clubbing.   LABORATORY PANEL:   CBC No results for input(s): WBC, HGB, HCT, PLT in the last 168 hours. ------------------------------------------------------------------------------------------------------------------  Chemistries  No results for input(s): NA, K, CL, CO2, GLUCOSE, BUN, CREATININE, CALCIUM, MG, AST, ALT, ALKPHOS, BILITOT in the last 168 hours.  Invalid input(s): GFRCGP ------------------------------------------------------------------------------------------------------------------  Cardiac Enzymes No results for input(s): TROPONINI in the last 168 hours. ------------------------------------------------------------  RADIOLOGY:   No results found for this or any previous visit. Results for orders placed during the hospital encounter of 09/03/15  DG Chest 2 View   Narrative CLINICAL DATA:  Chest pain for 1 month.  EXAM: CHEST  2 VIEW  COMPARISON:  01/23/2015  FINDINGS: Lungs are hyperaerated and clear, other than scarring at the right lateral base. Normal heart size. No pneumothorax. Chronic right rib deformities. Stable thoracic spine.  IMPRESSION: No active cardiopulmonary disease.   Electronically Signed   By: Marybelle Killings M.D.   On: 09/03/2015 07:56    ------------------------------------------------------------------------------------------------------------------  Thank  you for allowing [facility] to assist in the care of your patient. Our recommendations are noted above.  Please contact us if we can be of further service.   Marda Stalker, MD.  [facility]  Patricia Pesa, M.D.  Merton Border, M.D  12/20/2017

## 2017-12-20 NOTE — Progress Notes (Signed)
* [facility]     Assessment and Plan: 62 year old male with bullous emphysema, interstitial lung disease, chronic respiratory failure.   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 - advancing interstitial lung disease, discussed the diagnosis of pulmonary fibrosis, as well as its progressive nature. 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  -Now off prednisone.   --He had several side effects with esbriet, and is not interested in starting Ofev, especially since he is paying a lot of pocket for the alpha-1 infusions.    COPD/Bullous emphysema.  Severe bullous emphysema, significantly advanced on most recent CT chest over the last several months. - Maintain O2 saturations greater than 88% - Continue Anoro.  -Maintain off of prednisone. -Continue alpha 1 augmentation infusions.  He feels that these are helping his breathing, and therefore would like to continue.  Alpha-1 antitrypsin deficiency. -The patient's phenotype is MZ.   Acute hypoxic respiratory failure-chronic.  -Secondary to above, continue oxygen.    Nicotine abuse. -He quit smoking 09/2015   Date: 12/20/2017  MRN# [id number] Austin Patterson 17-Nov-1955   Austin Patterson is a 26 y.o. old male seen in follow up for chief complaint of  Chief Complaint  Patient presents with  . COPD    pt reports sob is getting worse  . Emphysema    pt has cough, chest tightness and wheezing occasionally.     HPI:   The patient is a 62 year old male with a history advanced COPD, interstitial lung disease.  He has been off the infusions because he was no longer on medicaid, he found that he felt better when he was taking the infusions.  He remains on 2L at all times and increases with activity occasionally. He is on Anoro daily and feels that it helps, uses rescue 3 times per day, nebs about once per week or more.  He feels that the alpha-1 infusions have been  helping.  He is off prednisone.  He was having issues with esbriet coverage and they were no longer covering it so he stopped it more than a year ago.   An alpha-1 antitrypsin serum level was 158. 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   Pulmonary function testing 01/29/16: Spirometry FEV1 was 1.69 L which is 52% of predicted, there was no significant improvement with bronchodilator therapy. The FVC was 61% of predicted, the FEV to FVC ratio was 65%. Lung volumes: Residual volume was 60% of predicted, TLC was 59% of predicted, RV/TLC ratio was 112% of predicted. Diffusion capacity: 26% of predicted. Interpretation. 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   CT chest 01/29/16: continued severe interstitial changes throughout both lungs, worst in the upper lobes. The previously seen pneumonic changes from 12/11/15 in the left base and to a lesser degree in the right base have improved. significant bullous emphysema as well as advancing interstitial changes seen onCT chest 12/11/15, in particular his emphysematous changes on the CT appeared to have advanced significantly since the previous CT, which was 8 months prior.    Medication:   Outpatient Encounter Medications as of 12/21/2017  Medication Sig  . albuterol (PROVENTIL HFA;VENTOLIN HFA) 108 (90 Base) MCG/ACT inhaler Inhale 2 puffs into the lungs every 6 (six) hours as needed for wheezing or shortness of breath.  [name] amLODipine (NORVASC) 10 MG tablet Take 1 tablet (10 mg total) by  mouth daily.  [name] aspirin EC 81 MG tablet Take 1 tablet (81 mg total) by mouth daily.  [name] atorvastatin (LIPITOR) 40 MG tablet Take 1 tablet (40 mg total) by mouth at bedtime.  . calcium-vitamin D (OSCAL WITH D) 500-200 MG-UNIT tablet Take 2 tablets by mouth daily with breakfast.  . cetirizine (ZYRTEC) 10 MG tablet Take 10 mg by  mouth at bedtime.  . clopidogrel (PLAVIX) 75 MG tablet Take 1 tablet (75 mg total) by mouth daily.  . Fluticasone-Salmeterol 113-14 MCG/ACT AEPB Inhale 2 puffs into the lungs 2 (two) times daily. Rinse mouth out after use.  . losartan-hydrochlorothiazide (HYZAAR) 100-12.5 MG tablet Take 1 tablet by mouth daily.  . magnesium oxide (MAG-OX) 400 (241.3 Mg) MG tablet Take 1 tablet (400 mg total) by mouth daily.  . metoprolol succinate (TOPROL-XL) 50 MG 24 hr tablet Take 1 tablet (50 mg total) by mouth daily.  . Multiple Vitamin (MULTIVITAMIN WITH MINERALS) TABS tablet Take 1 tablet by mouth daily.  . pantoprazole (PROTONIX) 40 MG tablet Take 1 tablet (40 mg total) by mouth 2 (two) times daily.  . potassium chloride 20 MEQ TBCR Take 20 mEq by mouth daily.  [name] Spacer/Aero Chamber Mouthpiece MISC 1 each by Does not apply route every 4 (four) hours as needed (wheezing).  [name] umeclidinium-vilanterol (ANORO ELLIPTA) 62.5-25 MCG/INH AEPB INHALE ONE PUFF INTO THE LUNGS DAILY   No facility-administered encounter medications on file as of 12/21/2017.      Allergies:  Patient has no known allergies.  Review of Systems: Gen:  Denies  fever, sweats. HEENT: Denies blurred vision. Cvc:  No dizziness, chest pain or heaviness Resp:   Denies cough or sputum porduction. Gi: Denies swallowing difficulty, stomach pain. constipation Gu:  Denies bladder incontinence, burning urine Ext:   No Joint pain, stiffness. Skin: No skin rash, easy bruising. Endoc:  No polyuria, polydipsia. Psych: No depression, insomnia. Other:  All other systems were reviewed and found to be negative other than what is mentioned in the HPI.   Physical Examination:   VS: BP (!) 138/92 (BP Location: Left Arm, Cuff Size: Normal)   Pulse 98   Resp 16   Ht 5\' 8"  (1.727 m)   Wt 194 lb (88 kg)   SpO2 95%   BMI 29.50 kg/m   General Appearance: No distress  Neuro:without focal findings,   HEENT: PERRLA, EOM intact. Pulmonary: scattered  crackles decreased breath sounds bilaterally.  CardiovascularNormal S1,S2.  No m/r/g.   Abdomen: Benign, Soft, non-tender. Renal:  No costovertebral tenderness  GU:  Not performed at this time. Endoc: No evident thyromegaly,  Skin:   warm, no rash. Extremities: normal, no cyanosis, clubbing.   LABORATORY PANEL:   CBC No results for input(s): WBC, HGB, HCT, PLT in the last 168 hours. ------------------------------------------------------------------------------------------------------------------  Chemistries  No results for input(s): NA, K, CL, CO2, GLUCOSE, BUN, CREATININE, CALCIUM, MG, AST, ALT, ALKPHOS, BILITOT in the last 168 hours.  Invalid input(s): GFRCGP ------------------------------------------------------------------------------------------------------------------  Cardiac Enzymes No results for input(s): TROPONINI in the last 168 hours. ------------------------------------------------------------  RADIOLOGY:   No results found for this or any previous visit. Results for orders placed during the hospital encounter of 09/03/15  DG Chest 2 View   Narrative CLINICAL DATA:  Chest pain for 1 month.  EXAM: CHEST  2 VIEW  COMPARISON:  01/23/2015  FINDINGS: Lungs are hyperaerated and clear, other than scarring at the right lateral base. Normal heart size. No pneumothorax. Chronic right rib deformities. Stable thoracic  spine.  IMPRESSION: No active cardiopulmonary disease.   Electronically Signed   By: Marybelle Killings M.D.   On: 09/03/2015 07:56    ------------------------------------------------------------------------------------------------------------------  Thank  you for allowing [facility] to assist in the care of your patient. Our recommendations are noted above.  Please contact us if we can be of further service.   Marda Stalker, MD.  [facility]  Patricia Pesa, M.D.  Merton Border,  M.D  12/20/2017 *

## 2017-12-21 ENCOUNTER — Encounter: Payer: Self-pay | Admitting: Internal Medicine

## 2017-12-21 ENCOUNTER — Ambulatory Visit: Payer: BLUE CROSS/BLUE SHIELD | Admitting: Internal Medicine

## 2017-12-21 VITALS — BP 138/92 | HR 98 | Resp 16 | Ht 68.0 in | Wt 194.0 lb

## 2017-12-21 DIAGNOSIS — J9611 Chronic respiratory failure with hypoxia: Secondary | ICD-10-CM

## 2017-12-21 DIAGNOSIS — J438 Other emphysema: Secondary | ICD-10-CM | POA: Diagnosis not present

## 2017-12-21 DIAGNOSIS — J8410 Pulmonary fibrosis, unspecified: Secondary | ICD-10-CM

## 2017-12-24 ENCOUNTER — Other Ambulatory Visit: Payer: Self-pay

## 2017-12-24 ENCOUNTER — Emergency Department
Admission: EM | Admit: 2017-12-24 | Discharge: 2017-12-24 | Disposition: A | Discharge status: Home / Self Care | Payer: BLUE CROSS/BLUE SHIELD | Attending: Student in an Organized Health Care Education/Training Program | Admitting: Student in an Organized Health Care Education/Training Program

## 2017-12-24 ENCOUNTER — Encounter: Payer: Self-pay | Admitting: Emergency Medicine

## 2017-12-24 DIAGNOSIS — I10 Essential (primary) hypertension: Secondary | ICD-10-CM | POA: Diagnosis not present

## 2017-12-24 DIAGNOSIS — Z79899 Other long term (current) drug therapy: Secondary | ICD-10-CM | POA: Insufficient documentation

## 2017-12-24 DIAGNOSIS — A020 Salmonella enteritis: Secondary | ICD-10-CM | POA: Diagnosis not present

## 2017-12-24 DIAGNOSIS — Z87891 Personal history of nicotine dependence: Secondary | ICD-10-CM | POA: Diagnosis not present

## 2017-12-24 DIAGNOSIS — J449 Chronic obstructive pulmonary disease, unspecified: Secondary | ICD-10-CM | POA: Diagnosis not present

## 2017-12-24 DIAGNOSIS — Z7902 Long term (current) use of antithrombotics/antiplatelets: Secondary | ICD-10-CM | POA: Diagnosis not present

## 2017-12-24 DIAGNOSIS — R197 Diarrhea, unspecified: Secondary | ICD-10-CM | POA: Diagnosis present

## 2017-12-24 DIAGNOSIS — Z7982 Long term (current) use of aspirin: Secondary | ICD-10-CM | POA: Insufficient documentation

## 2017-12-24 LAB — COMPREHENSIVE METABOLIC PANEL
ALBUMIN: 4.3 g/dL (ref 3.5–5.0)
ALT: 21 U/L (ref 17–63)
ANION GAP: 14 (ref 5–15)
AST: 44 U/L — ABNORMAL HIGH (ref 15–41)
Alkaline Phosphatase: 41 U/L (ref 38–126)
BUN: 16 mg/dL (ref 6–20)
CALCIUM: 8.2 mg/dL — AB (ref 8.9–10.3)
CO2: 22 mmol/L (ref 22–32)
Chloride: 100 mmol/L — ABNORMAL LOW (ref 101–111)
Creatinine, Ser: 1.42 mg/dL — ABNORMAL HIGH (ref 0.61–1.24)
GFR calc Af Amer: 60 mL/min — ABNORMAL LOW (ref >60)
GFR calc non Af Amer: 51 mL/min — ABNORMAL LOW (ref >60)
GLUCOSE: 122 mg/dL — AB (ref 65–99)
Potassium: 2.8 mmol/L — ABNORMAL LOW (ref 3.5–5.1)
SODIUM: 136 mmol/L (ref 135–145)
Total Bilirubin: 0.5 mg/dL (ref 0.3–1.2)
Total Protein: 7.8 g/dL (ref 6.5–8.1)

## 2017-12-24 LAB — URINALYSIS, COMPLETE (UACMP) WITH MICROSCOPIC
BILIRUBIN URINE: NEGATIVE
Bacteria, UA: NONE SEEN
Glucose, UA: NEGATIVE mg/dL
Hgb urine dipstick: NEGATIVE
Ketones, ur: NEGATIVE mg/dL
Leukocytes, UA: NEGATIVE
Nitrite: NEGATIVE
Protein, ur: 30 mg/dL — AB
SPECIFIC GRAVITY, URINE: 1.017 (ref 1.005–1.030)
pH: 5 (ref 5.0–8.0)

## 2017-12-24 LAB — C DIFFICILE QUICK SCREEN W PCR REFLEX
C Diff antigen: NEGATIVE
C Diff interpretation: NOT DETECTED
C Diff toxin: NEGATIVE

## 2017-12-24 LAB — GASTROINTESTINAL PANEL BY PCR, STOOL (REPLACES STOOL CULTURE)
ASTROVIRUS: NOT DETECTED
Adenovirus F40/41: NOT DETECTED
CYCLOSPORA CAYETANENSIS: NOT DETECTED
Campylobacter species: NOT DETECTED
Cryptosporidium: NOT DETECTED
ENTAMOEBA HISTOLYTICA: NOT DETECTED
ENTEROTOXIGENIC E COLI (ETEC): NOT DETECTED
Enteroaggregative E coli (EAEC): NOT DETECTED
Enteropathogenic E coli (EPEC): NOT DETECTED
Giardia lamblia: NOT DETECTED
Norovirus GI/GII: NOT DETECTED
Plesimonas shigelloides: NOT DETECTED
Rotavirus A: NOT DETECTED
SALMONELLA SPECIES: DETECTED — AB
SHIGA LIKE TOXIN PRODUCING E COLI (STEC): NOT DETECTED
Sapovirus (I, II, IV, and V): NOT DETECTED
Shigella/Enteroinvasive E coli (EIEC): NOT DETECTED
VIBRIO CHOLERAE: NOT DETECTED
VIBRIO SPECIES: NOT DETECTED
Yersinia enterocolitica: NOT DETECTED

## 2017-12-24 LAB — CBC
HCT: 43.2 % (ref 40.0–52.0)
Hemoglobin: 14.7 g/dL (ref 13.0–18.0)
MCH: 34.1 pg — AB (ref 26.0–34.0)
MCHC: 34 g/dL (ref 32.0–36.0)
MCV: 100.2 fL — AB (ref 80.0–100.0)
PLATELETS: 281 10*3/uL (ref 150–440)
RBC: 4.31 MIL/uL — ABNORMAL LOW (ref 4.40–5.90)
RDW: 15.1 % — AB (ref 11.5–14.5)
WBC: 11.7 10*3/uL — ABNORMAL HIGH (ref 3.8–10.6)

## 2017-12-24 LAB — LIPASE, BLOOD: Lipase: 43 U/L (ref 11–51)

## 2017-12-24 MED ORDER — SODIUM CHLORIDE 0.9 % IV BOLUS (SEPSIS)
1000.0000 mL | Freq: Once | INTRAVENOUS | Status: AC
  Administered 2017-12-24: 1000 mL via INTRAVENOUS

## 2017-12-24 MED ORDER — AMOXICILLIN 500 MG PO CAPS
500.0000 mg | ORAL_CAPSULE | Freq: Once | ORAL | Status: AC
Start: 2017-12-24 — End: 2017-12-24
  Administered 2017-12-24: 500 mg via ORAL
  Filled 2017-12-24: qty 1

## 2017-12-24 MED ORDER — METOPROLOL TARTRATE 25 MG PO TABS
25.0000 mg | ORAL_TABLET | Freq: Once | ORAL | Status: AC
  Administered 2017-12-24: 25 mg via ORAL
  Filled 2017-12-24: qty 1

## 2017-12-24 MED ORDER — SODIUM CHLORIDE 0.9 % IV BOLUS (SEPSIS)
500.0000 mL | Freq: Once | INTRAVENOUS | Status: AC
  Administered 2017-12-24: 500 mL via INTRAVENOUS

## 2017-12-24 MED ORDER — AMOXICILLIN 500 MG PO CAPS: 500.0000 mg | ORAL_CAPSULE | Freq: Three times a day (TID) | ORAL | 0 refills | Status: AC

## 2017-12-24 MED ORDER — POTASSIUM CHLORIDE CRYS ER 20 MEQ PO TBCR
40.0000 meq | EXTENDED_RELEASE_TABLET | Freq: Once | ORAL | Status: AC
  Administered 2017-12-24: 40 meq via ORAL
  Filled 2017-12-24: qty 2

## 2017-12-24 MED ORDER — MAGNESIUM SULFATE 2 GM/50ML IV SOLN
2.0000 g | Freq: Once | INTRAVENOUS | Status: AC
  Administered 2017-12-24: 2 g via INTRAVENOUS
  Filled 2017-12-24: qty 50

## 2017-12-24 NOTE — ED Notes (Signed)
Pt given sandwich tray 

## 2017-12-24 NOTE — ED Provider Notes (Signed)
[facility] Emergency Department Provider Note    First MD Initiated Contact with Patient 12/24/17 1705     (approximate)  I have reviewed the triage vital signs and the nursing notes.   HISTORY  Chief Complaint Diarrhea    HPI Austin Patterson is a 62 y.o. male with a history of COPD hypertension and pulmonary fibrosis on 2 L nasal cannula chronically at home presents with watery diarrhea for the past 2 days.  No measured fevers.  No nausea or vomiting.  States he has had decreased oral intake as he feels that every time he has something to eat it runs right through him.  Denies any blood in his stools.  No melena.  No abdominal pain.  No dysuria.  No chest pain or worsening shortness of breath.  No recent antibiotics.  Past Medical History:  Diagnosis Date  . COPD (chronic obstructive pulmonary disease) ([place])   . Hyperlipidemia   . Hypertension   . Pulmonary fibrosis ([place]) 11/2015   Family History  Problem Relation Age of Onset  . Heart disease Mother    Past Surgical History:  Procedure Laterality Date  . CORONARY STENT PLACEMENT    . ESOPHAGOGASTRODUODENOSCOPY (EGD) WITH PROPOFOL N/A 09/23/2016   Procedure: ESOPHAGOGASTRODUODENOSCOPY (EGD) WITH PROPOFOL;  Surgeon: [name], MD;  Location: [facility] ENDOSCOPY;  Service: Endoscopy;  Laterality: N/A;  . SHOULDER ACROMIOPLASTY     Patient Active Problem List   Diagnosis Date Noted  . Hypomagnesemia 03/31/2017  . Hiatal hernia   . Reflux esophagitis   . Hematemesis without nausea   . Hyponatremia 09/21/2016  . Pneumonia 12/09/2015  . COPD with acute exacerbation ([place]) 10/14/2015      Prior to Admission medications   Medication Sig Start Date End Date Taking? Authorizing Provider  albuterol (PROVENTIL HFA;VENTOLIN HFA) 108 (90 Base) MCG/ACT inhaler Inhale 2 puffs into the lungs every 6 (six) hours as needed for wheezing or shortness of breath. 01/29/17   Darel Hong, MD  amLODipine  (NORVASC) 10 MG tablet Take 1 tablet (10 mg total) by mouth daily. 08/19/16   Tawni Millers, MD  aspirin EC 81 MG tablet Take 1 tablet (81 mg total) by mouth daily. 08/19/16   Tawni Millers, MD  atorvastatin (LIPITOR) 40 MG tablet Take 1 tablet (40 mg total) by mouth at bedtime. 08/19/16   Tawni Millers, MD  calcium-vitamin D (OSCAL WITH D) 500-200 MG-UNIT tablet Take 2 tablets by mouth daily with breakfast. 09/24/16   Fritzi Mandes, MD  cetirizine (ZYRTEC) 10 MG tablet Take 10 mg by mouth at bedtime. 08/19/16   [provider]  clopidogrel (PLAVIX) 75 MG tablet Take 1 tablet (75 mg total) by mouth daily. 08/19/16   Tawni Millers, MD  losartan-hydrochlorothiazide (HYZAAR) 100-12.5 MG tablet Take 1 tablet by mouth daily. 08/19/16   Tawni Millers, MD  magnesium oxide (MAG-OX) 400 (241.3 Mg) MG tablet Take 1 tablet (400 mg total) by mouth daily. 06/18/17   Hillary Bow, MD  metoprolol succinate (TOPROL-XL) 50 MG 24 hr tablet Take 1 tablet (50 mg total) by mouth daily. 08/19/16   Tawni Millers, MD  Multiple Vitamin (MULTIVITAMIN WITH MINERALS) TABS tablet Take 1 tablet by mouth daily. 09/24/16   Fritzi Mandes, MD  pantoprazole (PROTONIX) 40 MG tablet Take 1 tablet (40 mg total) by mouth 2 (two) times daily. 09/23/16   Fritzi Mandes, MD  potassium chloride 20 MEQ TBCR Take 20 mEq by mouth daily.  06/18/17   Hillary Bow, MD  Spacer/Aero Chamber Mouthpiece MISC 1 each by Does not apply route every 4 (four) hours as needed (wheezing). 01/29/17   Darel Hong, MD  umeclidinium-vilanterol [facility] ELLIPTA) 62.5-25 MCG/INH AEPB INHALE ONE PUFF INTO THE LUNGS DAILY 01/07/17   Flora Lipps, MD    Allergies Patient has no known allergies.    Social History Social History   Tobacco Use  . Smoking status: Former Smoker    Last attempt to quit: 10/14/2015    Years since quitting: 2.1  . Smokeless tobacco: Former Network engineer Use Topics  . Alcohol use: Yes    Alcohol/week: 16.8 oz    Types: 28 Cans  of beer per week    Comment: 6 12 oz cans of beer/day  . Drug use: No    Review of Systems Patient denies headaches, rhinorrhea, blurry vision, numbness, shortness of breath, chest pain, edema, cough, abdominal pain, nausea, vomiting, diarrhea, dysuria, fevers, rashes or hallucinations unless otherwise stated above in HPI. ____________________________________________   PHYSICAL EXAM:  VITAL SIGNS: Vitals:   12/24/17 1313  BP: 130/87  Pulse: (!) 127  Resp: 18  Temp: 97.7 F (36.5 C)  SpO2: 95%    Constitutional: Alert and oriented. Well appearing and in no acute distress. Eyes: Conjunctivae are normal.  Head: Atraumatic. Nose: No congestion/rhinnorhea. Mouth/Throat: Mucous membranes are moist.   Neck: No stridor. Painless ROM.  Cardiovascular: Normal rate, regular rhythm. Grossly normal heart sounds.  Good peripheral circulation. Respiratory: Normal respiratory effort.  No retractions. Lungs with coarse crackles throughout Gastrointestinal: Soft and nontender. No distention. No abdominal bruits. No CVA tenderness. Genitourinary:  Musculoskeletal: No lower extremity tenderness nor edema.  No joint effusions. Neurologic:  Normal speech and language. No gross focal neurologic deficits are appreciated. No facial droop Skin:  Skin is warm, dry and intact. No rash noted. Psychiatric: Mood and affect are normal. Speech and behavior are normal.  ____________________________________________   LABS (all labs ordered are listed, but only abnormal results are displayed)  Results for orders placed or performed during the hospital encounter of 12/24/17 (from the past 24 hour(s))  CBC     Status: Abnormal   Collection Time: 12/24/17  1:29 PM  Result Value Ref Range   WBC 11.7 (H) 3.8 - 10.6 K/uL   RBC 4.31 (L) 4.40 - 5.90 MIL/uL   Hemoglobin 14.7 13.0 - 18.0 g/dL   HCT 43.2 40.0 - 52.0 %   MCV 100.2 (H) 80.0 - 100.0 fL   MCH 34.1 (H) 26.0 - 34.0 pg   MCHC 34.0 32.0 - 36.0 g/dL    RDW 15.1 (H) 11.5 - 14.5 %   Platelets 281 150 - 440 K/uL  Comprehensive metabolic panel     Status: Abnormal   Collection Time: 12/24/17  2:46 PM  Result Value Ref Range   Sodium 136 135 - 145 mmol/L   Potassium 2.8 (L) 3.5 - 5.1 mmol/L   Chloride 100 (L) 101 - 111 mmol/L   CO2 22 22 - 32 mmol/L   Glucose, Bld 122 (H) 65 - 99 mg/dL   BUN 16 6 - 20 mg/dL   Creatinine, Ser 1.42 (H) 0.61 - 1.24 mg/dL   Calcium 8.2 (L) 8.9 - 10.3 mg/dL   Total Protein 7.8 6.5 - 8.1 g/dL   Albumin 4.3 3.5 - 5.0 g/dL   AST 44 (H) 15 - 41 U/L   ALT 21 17 - 63 U/L   Alkaline Phosphatase 41 38 -  126 U/L   Total Bilirubin 0.5 0.3 - 1.2 mg/dL   GFR calc non Af Amer 51 (L) >60 mL/min   GFR calc Af Amer 60 (L) >60 mL/min   Anion gap 14 5 - 15  Lipase, blood     Status: None   Collection Time: 12/24/17  2:46 PM  Result Value Ref Range   Lipase 43 11 - 51 U/L   ____________________________________________  EKG My review and personal interpretation at Time: 13:21   Indication: tachycardia  Rate: 125  Rhythm: sinus Axis: normal Other: normal intervals, no stemi, nonspecific st abn ____________________________________________  RADIOLOGY   ____________________________________________   PROCEDURES  Procedure(s) performed:  Procedures    Critical Care performed: no ____________________________________________   INITIAL IMPRESSION / ASSESSMENT AND PLAN / ED COURSE  Pertinent labs & imaging results that were available during my care of the patient were reviewed by me and considered in my medical decision making (see chart for details).  DDX: enteritis, c dif, norovirus, dehydration, elkectrolyte abn  Austin Patterson is a 62 y.o. who presents to the ED with diarrhea as described above.  Patient otherwise well-appearing his abdominal exam is soft and benign.  Does have mild dehydration borderline AK I but is had similar episodes in the past.  Will give IV fluids will trial oral hydration.  Will  check for C. difficile as well as an PCR panel to evaluate for evidence of infectious enteritis which I highly suspect.  Denies any pain.  No worsening shortness of breath.  Clinical Course as of Dec 25 2034  Fri Dec 24, 2017  2033 Patient has tolerated oral hydration.  Requesting additional [place] sandwich.  Has not had any recurrent episodes of diarrhea.  His heart rate has improved down to 99.  GI panel is consistent with Salmonella and patient did state that he had chicken on Tuesday corresponding with his onset of symptoms on Wednesday.  Repeat abdominal exam is soft and benign.  Patient otherwise stable and appropriate for discharge home.  [PR]    Clinical Course User Index [PR] Merlyn Lot, MD     ____________________________________________   FINAL CLINICAL IMPRESSION(S) / ED DIAGNOSES  Final diagnoses:  Salmonella enteritis      NEW MEDICATIONS STARTED DURING THIS VISIT:  New Prescriptions   No medications on file     Note:  This document was prepared using Dragon voice recognition software and may include unintentional dictation errors.    Merlyn Lot, MD 12/24/17 2037

## 2017-12-24 NOTE — Discharge Instructions (Signed)

## 2017-12-24 NOTE — ED Notes (Signed)
Pt given [place] sandwich to eat and instructions on how to collect a stool sample and urine sample when he is able to.

## 2017-12-24 NOTE — ED Triage Notes (Signed)
Pt here with c/o diarrhea and possible dehydration for the past few days, denies abdominal pain, denies recent antibiotic use.  States when he eats or drinks it "goes right through me." Pt appears in no distress. On oxygen at home 2L.

## 2018-03-15 ENCOUNTER — Telehealth: Payer: Self-pay | Admitting: Internal Medicine

## 2018-03-15 NOTE — Telephone Encounter (Signed)
Patient calling to check on status of inogen  Please call to discuss

## 2018-03-16 NOTE — Telephone Encounter (Signed)
Called and spoke with [place] at [facility] where order was faxed on 01/11/18.  Per Alphonse Guild never received order.  Re-faxed order and received confirmation from fax.  Called Tonya this morning to verify that order was received and she stated that she had the order and would contact patient today.  I also returned patient's call and received no answer. LMOVM for pt to return my call. [name]

## 2018-03-16 NOTE — Telephone Encounter (Signed)
Patient returned call and stated that he heard from Austin Patterson and they have scheduled an appointment to bring out the Erin on Tues 03/22/18. Nothing else needed at this time. [name] Pt advised to contact us back if any issue occurs. [name]

## 2018-04-07 ENCOUNTER — Emergency Department: Payer: BLUE CROSS/BLUE SHIELD

## 2018-04-07 ENCOUNTER — Encounter: Payer: Self-pay | Admitting: Medical Oncology

## 2018-04-07 ENCOUNTER — Emergency Department
Admission: EM | Admit: 2018-04-07 | Discharge: 2018-04-07 | Disposition: A | Discharge status: Home / Self Care | Payer: BLUE CROSS/BLUE SHIELD | Attending: Emergency Medicine | Admitting: Emergency Medicine

## 2018-04-07 DIAGNOSIS — Z87891 Personal history of nicotine dependence: Secondary | ICD-10-CM | POA: Diagnosis not present

## 2018-04-07 DIAGNOSIS — Z7982 Long term (current) use of aspirin: Secondary | ICD-10-CM | POA: Insufficient documentation

## 2018-04-07 DIAGNOSIS — Z79899 Other long term (current) drug therapy: Secondary | ICD-10-CM | POA: Insufficient documentation

## 2018-04-07 DIAGNOSIS — J449 Chronic obstructive pulmonary disease, unspecified: Secondary | ICD-10-CM | POA: Diagnosis not present

## 2018-04-07 DIAGNOSIS — R0602 Shortness of breath: Secondary | ICD-10-CM | POA: Insufficient documentation

## 2018-04-07 DIAGNOSIS — R079 Chest pain, unspecified: Secondary | ICD-10-CM | POA: Diagnosis present

## 2018-04-07 DIAGNOSIS — I10 Essential (primary) hypertension: Secondary | ICD-10-CM | POA: Diagnosis not present

## 2018-04-07 LAB — BASIC METABOLIC PANEL
ANION GAP: 10 (ref 5–15)
BUN: 21 mg/dL — ABNORMAL HIGH (ref 6–20)
CO2: 26 mmol/L (ref 22–32)
Calcium: 9.2 mg/dL (ref 8.9–10.3)
Chloride: 99 mmol/L — ABNORMAL LOW (ref 101–111)
Creatinine, Ser: 1.14 mg/dL (ref 0.61–1.24)
Glucose, Bld: 123 mg/dL — ABNORMAL HIGH (ref 65–99)
POTASSIUM: 4.6 mmol/L (ref 3.5–5.1)
SODIUM: 135 mmol/L (ref 135–145)

## 2018-04-07 LAB — CBC
HEMATOCRIT: 39.6 % — AB (ref 40.0–52.0)
HEMOGLOBIN: 13.9 g/dL (ref 13.0–18.0)
MCH: 35.8 pg — ABNORMAL HIGH (ref 26.0–34.0)
MCHC: 35.2 g/dL (ref 32.0–36.0)
MCV: 101.7 fL — ABNORMAL HIGH (ref 80.0–100.0)
Platelets: 290 10*3/uL (ref 150–440)
RBC: 3.89 MIL/uL — ABNORMAL LOW (ref 4.40–5.90)
RDW: 12.5 % (ref 11.5–14.5)
WBC: 8.1 10*3/uL (ref 3.8–10.6)

## 2018-04-07 LAB — TROPONIN I: Troponin I: <0.03 ng/mL (ref <0.03)

## 2018-04-07 NOTE — ED Notes (Signed)
Pt pulled from line and EKG completed with VS.

## 2018-04-07 NOTE — ED Notes (Signed)
Pt ambulatory to wheel chair upon discharge. Verbalized understanding of discharge instructions and follow-up care. VSS. Skin warm and dry. A&O x4. On baseline 2L O2.

## 2018-04-07 NOTE — Discharge Instructions (Addendum)
You have been seen in the emergency department today for chest pain.  Your work-up including labs, EKG and chest x-ray are normal.  Please call the number provided for cardiology to arrange a follow-up appointment as soon as possible to discuss further work-up and evaluation.  Return to the emergency department for any return of chest pain especially if accompanied by sweatiness, trouble breathing or nausea.

## 2018-04-07 NOTE — ED Provider Notes (Signed)
[facility] Emergency Department Provider Note  Time seen: 12:11 PM  I have reviewed the triage vital signs and the nursing notes.   HISTORY  Chief Complaint Chest Pain and Shortness of Breath    HPI Austin Patterson is a 62 y.o. male with a past medical history of COPD on 2 L of oxygen 24/7, hypertension, hyperlipidemia, pulmonary fibrosis, presents to the emergency department for chest discomfort.  According to the patient yesterday morning he had chest discomfort went away after an hour or 2.  This morning he states same thing he awoke with mild chest discomfort which quickly went away.  Patient states he had a stent placed in 2009, was concerned so he came to the emergency department for evaluation.  Denies any chest discomfort or shortness of breath currently.  States he has been feeling slightly more short of breath for the past several weeks but blames that on the heat.  Wears 2 L of oxygen at all times.  Denies any shortness of breath currently.  No leg pain or swelling.  Largely negative review of systems.  Denies any nausea or diaphoresis at any point.   Past Medical History:  Diagnosis Date  . COPD (chronic obstructive pulmonary disease) ([place])   . Hyperlipidemia   . Hypertension   . Pulmonary fibrosis ([place]) 11/2015    Patient Active Problem List   Diagnosis Date Noted  . Hypomagnesemia 03/31/2017  . Hiatal hernia   . Reflux esophagitis   . Hematemesis without nausea   . Hyponatremia 09/21/2016  . Pneumonia 12/09/2015  . COPD with acute exacerbation ([place]) 10/14/2015    Past Surgical History:  Procedure Laterality Date  . CORONARY STENT PLACEMENT    . ESOPHAGOGASTRODUODENOSCOPY (EGD) WITH PROPOFOL N/A 09/23/2016   Procedure: ESOPHAGOGASTRODUODENOSCOPY (EGD) WITH PROPOFOL;  Surgeon: [name], MD;  Location: [facility] ENDOSCOPY;  Service: Endoscopy;  Laterality: N/A;  . SHOULDER ACROMIOPLASTY      Prior to Admission medications   Medication Sig  Start Date End Date Taking? Authorizing Provider  albuterol (PROVENTIL HFA;VENTOLIN HFA) 108 (90 Base) MCG/ACT inhaler Inhale 2 puffs into the lungs every 6 (six) hours as needed for wheezing or shortness of breath. 01/29/17   Darel Hong, MD  amLODipine (NORVASC) 10 MG tablet Take 1 tablet (10 mg total) by mouth daily. 08/19/16   Tawni Millers, MD  aspirin EC 81 MG tablet Take 1 tablet (81 mg total) by mouth daily. 08/19/16   Tawni Millers, MD  atorvastatin (LIPITOR) 40 MG tablet Take 1 tablet (40 mg total) by mouth at bedtime. 08/19/16   Tawni Millers, MD  calcium-vitamin D (OSCAL WITH D) 500-200 MG-UNIT tablet Take 2 tablets by mouth daily with breakfast. 09/24/16   Fritzi Mandes, MD  cetirizine (ZYRTEC) 10 MG tablet Take 10 mg by mouth at bedtime. 08/19/16   [provider]  clopidogrel (PLAVIX) 75 MG tablet Take 1 tablet (75 mg total) by mouth daily. 08/19/16   Tawni Millers, MD  losartan-hydrochlorothiazide (HYZAAR) 100-12.5 MG tablet Take 1 tablet by mouth daily. 08/19/16   Tawni Millers, MD  magnesium oxide (MAG-OX) 400 (241.3 Mg) MG tablet Take 1 tablet (400 mg total) by mouth daily. 06/18/17   Hillary Bow, MD  metoprolol succinate (TOPROL-XL) 50 MG 24 hr tablet Take 1 tablet (50 mg total) by mouth daily. 08/19/16   Tawni Millers, MD  Multiple Vitamin (MULTIVITAMIN WITH MINERALS) TABS tablet Take 1 tablet by mouth daily. 09/24/16  Fritzi Mandes, MD  pantoprazole (PROTONIX) 40 MG tablet Take 1 tablet (40 mg total) by mouth 2 (two) times daily. 09/23/16   Fritzi Mandes, MD  potassium chloride 20 MEQ TBCR Take 20 mEq by mouth daily. 06/18/17   Hillary Bow, MD  Spacer/Aero Chamber Mouthpiece MISC 1 each by Does not apply route every 4 (four) hours as needed (wheezing). 01/29/17   Darel Hong, MD  umeclidinium-vilanterol [facility] ELLIPTA) 62.5-25 MCG/INH AEPB INHALE ONE PUFF INTO THE LUNGS DAILY 01/07/17   Flora Lipps, MD    No Known Allergies  Family History  Problem Relation Age of  Onset  . Heart disease Mother     Social History Social History   Tobacco Use  . Smoking status: Former Smoker    Last attempt to quit: 10/14/2015    Years since quitting: 2.4  . Smokeless tobacco: Former Network engineer Use Topics  . Alcohol use: Yes    Alcohol/week: 16.8 oz    Types: 28 Cans of beer per week    Comment: 6 12 oz cans of beer/day  . Drug use: No    Review of Systems Constitutional: Negative for fever. Eyes: Negative for visual complaints ENT: Negative for recent illness/congestion Cardiovascular: Mild chest discomfort Respiratory: Mild shortness of breath, chronic Gastrointestinal: Negative for abdominal pain, vomiting.  States intermittent loose stool but this is also chronic. Genitourinary: Negative for urinary compaints Musculoskeletal: Negative for leg pain or swelling. Skin: Negative for skin complaints  Neurological: Negative for headache All other ROS negative  ____________________________________________   PHYSICAL EXAM:  VITAL SIGNS: ED Triage Vitals [04/07/18 1056]  Enc Vitals Group     BP (!) 137/91     Pulse Rate (!) 103     Resp 20     Temp 98.3 F (36.8 C)     Temp Source Oral     SpO2 97 %     Weight 194 lb (88 kg)     Height      Head Circumference      Peak Flow      Pain Score 0     Pain Loc      Pain Edu?      Excl. in [place]?     Constitutional: Alert and oriented. Well appearing and in no distress. Eyes: Normal exam ENT   Head: Normocephalic and atraumatic.   Mouth/Throat: Mucous membranes are moist. Cardiovascular: Normal rate, regular rhythm. No murmur Respiratory: Normal respiratory effort without tachypnea nor retractions. Breath sounds are clear Gastrointestinal: Soft and nontender. No distention.   Musculoskeletal: Nontender with normal range of motion in all extremities. No lower extremity tenderness or edema. Neurologic:  Normal speech and language. No gross focal neurologic deficits  Skin:  Skin is  warm, dry and intact.  Psychiatric: Mood and affect are normal.  ____________________________________________    EKG  EKG reviewed and interpreted by myself shows sinus tachycardia at 104 bpm with a narrow QRS, normal axis, normal intervals, no concerning ST changes.  ____________________________________________    RADIOLOGY  Chest x-ray shows chronic changes but no acute abnormality.  ____________________________________________   INITIAL IMPRESSION / ASSESSMENT AND PLAN / ED COURSE  Pertinent labs & imaging results that were available during my care of the patient were reviewed by me and considered in my medical decision making (see chart for details).  Patient presents to the emergency department for chest discomfort yesterday morning and this morning.  Denies any chest pain currently.  Denies any current shortness of  breath.  Denies any nausea or diaphoresis at any point.  Patient sees Dr. Nehemiah Massed for cardiology but has not seen in several years.  Continues to take aspirin and Plavix each morning.  Currently the patient appears well with an overall normal physical examination.  Reassuringly the patient's EKG and labs are largely at baseline with a negative troponin chronic changes on x-ray but no acute abnormality.  Given the patient is symptom-free with a negative work-up including cardiac enzymes I believe the patient is safe for discharge home with cardiology follow-up.  I discussed this with the patient he is agreeable to this plan of care.  Will call Dr. Annett Fabian today to arrange a follow-up appointment as soon as possible.  I discussed with the patient if he has any return of chest pain especially with any shortness of breath nausea or diaphoresis he is to return to the emergency department.  Patient agreeable with this plan of care.  ____________________________________________   FINAL CLINICAL IMPRESSION(S) / ED DIAGNOSES  Chest pain    Harvest Dark, MD 04/07/18  1215

## 2018-04-07 NOTE — ED Triage Notes (Signed)
Pt reports for a couple of days he has been having chest pain off and on, currently does not have chest pain. Pt reports worsening sob, wears home o2- 2L.[name]

## 2018-05-24 ENCOUNTER — Telehealth: Payer: Self-pay | Admitting: Internal Medicine

## 2018-05-24 NOTE — Telephone Encounter (Signed)
Needs updated clinicals to request authorization for infusion this week. Please call to discuss

## 2018-05-24 NOTE — Telephone Encounter (Signed)
Requesting most recent OV note due to pt has had insurance change and needs recent note for infusion for Zemaira. Faxed to [facility] at [phone].

## 2018-06-13 NOTE — Telephone Encounter (Addendum)
Patient says he was denied for Zemaira infusion by [place] because he has not had a breathing tx.  Please call to discuss.  Patient says he can tell he has not had his infusion.

## 2018-06-13 NOTE — Telephone Encounter (Signed)
Our office has not received anything from insurance in regards to denial from his insurance. Called [facility] to see if they had any information and spoke to [employer]. She provided names and number below for possible follow up as she had no information. Hurt, [place], Keri or Drema Pry RN  Spoke with [employer] who states insurance informed of denial she did not have any further information.

## 2018-06-14 ENCOUNTER — Emergency Department: Payer: Medicare HMO

## 2018-06-14 ENCOUNTER — Encounter: Payer: Self-pay | Admitting: Emergency Medicine

## 2018-06-14 ENCOUNTER — Emergency Department
Admission: EM | Admit: 2018-06-14 | Discharge: 2018-06-14 | Disposition: A | Discharge status: Home / Self Care | Payer: Medicare HMO | Attending: Emergency Medicine | Admitting: Emergency Medicine

## 2018-06-14 DIAGNOSIS — Z955 Presence of coronary angioplasty implant and graft: Secondary | ICD-10-CM | POA: Diagnosis not present

## 2018-06-14 DIAGNOSIS — Z87891 Personal history of nicotine dependence: Secondary | ICD-10-CM | POA: Insufficient documentation

## 2018-06-14 DIAGNOSIS — Z79899 Other long term (current) drug therapy: Secondary | ICD-10-CM | POA: Insufficient documentation

## 2018-06-14 DIAGNOSIS — R1011 Right upper quadrant pain: Secondary | ICD-10-CM

## 2018-06-14 DIAGNOSIS — R0602 Shortness of breath: Secondary | ICD-10-CM | POA: Diagnosis present

## 2018-06-14 DIAGNOSIS — I10 Essential (primary) hypertension: Secondary | ICD-10-CM | POA: Insufficient documentation

## 2018-06-14 DIAGNOSIS — Z7982 Long term (current) use of aspirin: Secondary | ICD-10-CM | POA: Insufficient documentation

## 2018-06-14 DIAGNOSIS — J189 Pneumonia, unspecified organism: Secondary | ICD-10-CM | POA: Insufficient documentation

## 2018-06-14 DIAGNOSIS — Z7902 Long term (current) use of antithrombotics/antiplatelets: Secondary | ICD-10-CM | POA: Diagnosis not present

## 2018-06-14 DIAGNOSIS — J449 Chronic obstructive pulmonary disease, unspecified: Secondary | ICD-10-CM | POA: Insufficient documentation

## 2018-06-14 LAB — CBC
HEMATOCRIT: 36.4 % — AB (ref 40.0–52.0)
Hemoglobin: 12.9 g/dL — ABNORMAL LOW (ref 13.0–18.0)
MCH: 36.8 pg — ABNORMAL HIGH (ref 26.0–34.0)
MCHC: 35.6 g/dL (ref 32.0–36.0)
MCV: 103.4 fL — AB (ref 80.0–100.0)
Platelets: 309 10*3/uL (ref 150–440)
RBC: 3.51 MIL/uL — ABNORMAL LOW (ref 4.40–5.90)
RDW: 13.1 % (ref 11.5–14.5)
WBC: 8 10*3/uL (ref 3.8–10.6)

## 2018-06-14 LAB — DIFFERENTIAL
BASOS PCT: 0 %
Basophils Absolute: 0 10*3/uL (ref 0–0.1)
Eosinophils Absolute: 0 10*3/uL (ref 0–0.7)
Eosinophils Relative: 0 %
Lymphocytes Relative: 10 %
Lymphs Abs: 0.8 10*3/uL — ABNORMAL LOW (ref 1.0–3.6)
Monocytes Absolute: 0.7 10*3/uL (ref 0.2–1.0)
Monocytes Relative: 9 %
NEUTROS ABS: 6.4 10*3/uL (ref 1.4–6.5)
Neutrophils Relative %: 81 %

## 2018-06-14 LAB — HEPATIC FUNCTION PANEL
ALK PHOS: 45 U/L (ref 38–126)
ALT: 23 U/L (ref 0–44)
AST: 36 U/L (ref 15–41)
Albumin: 4.7 g/dL (ref 3.5–5.0)
BILIRUBIN INDIRECT: 0.9 mg/dL (ref 0.3–0.9)
BILIRUBIN TOTAL: 1.1 mg/dL (ref 0.3–1.2)
Bilirubin, Direct: 0.2 mg/dL (ref 0.0–0.2)
Total Protein: 8 g/dL (ref 6.5–8.1)

## 2018-06-14 LAB — BASIC METABOLIC PANEL
Anion gap: 13 (ref 5–15)
BUN: 11 mg/dL (ref 8–23)
CHLORIDE: 90 mmol/L — AB (ref 98–111)
CO2: 27 mmol/L (ref 22–32)
CREATININE: 0.74 mg/dL (ref 0.61–1.24)
Calcium: 8.1 mg/dL — ABNORMAL LOW (ref 8.9–10.3)
GFR calc Af Amer: >60 mL/min (ref >60)
Glucose, Bld: 133 mg/dL — ABNORMAL HIGH (ref 70–99)
POTASSIUM: 3.5 mmol/L (ref 3.5–5.1)
Sodium: 130 mmol/L — ABNORMAL LOW (ref 135–145)

## 2018-06-14 LAB — TROPONIN I: Troponin I: <0.03 ng/mL (ref <0.03)

## 2018-06-14 MED ORDER — CEFUROXIME AXETIL 500 MG PO TABS: 500.0000 mg | ORAL_TABLET | Freq: Two times a day (BID) | ORAL | 0 refills | Status: AC

## 2018-06-14 MED ORDER — SODIUM CHLORIDE 0.9 % IV SOLN
1.0000 g | Freq: Once | INTRAVENOUS | Status: AC
  Administered 2018-06-14: 1 g via INTRAVENOUS
  Filled 2018-06-14: qty 10

## 2018-06-14 MED ORDER — DOXYCYCLINE HYCLATE 100 MG PO CAPS: 100.0000 mg | ORAL_CAPSULE | Freq: Two times a day (BID) | ORAL | 0 refills | Status: DC

## 2018-06-14 MED ORDER — IPRATROPIUM-ALBUTEROL 0.5-2.5 (3) MG/3ML IN SOLN
3.0000 mL | Freq: Once | RESPIRATORY_TRACT | Status: AC
  Administered 2018-06-14: 3 mL via RESPIRATORY_TRACT
  Filled 2018-06-14: qty 3

## 2018-06-14 MED ORDER — DOXYCYCLINE HYCLATE 100 MG PO TABS
100.0000 mg | ORAL_TABLET | Freq: Once | ORAL | Status: AC
  Administered 2018-06-14: 100 mg via ORAL
  Filled 2018-06-14: qty 1

## 2018-06-14 NOTE — Discharge Instructions (Addendum)
Please have your doctor check on you tomorrow or the next day.  Please take the doxycycline 1 pill twice a day and the cefuroxime 1 pill twice a day.  Both antibiotics together should take care of the pneumonia.  Please return at once if you feel sicker, more short of breath, develop a high fever or have any other problems.

## 2018-06-14 NOTE — Telephone Encounter (Signed)
Pt informed informed that he needs to bring his insurance card by office to be scanned into chart. There is nothing that can be done in regard to [place] denial without patient information. We still have not received denial and patient can't read ID # on card. Nothing further needed until ins info received.

## 2018-06-14 NOTE — ED Notes (Signed)
First Nurse Note: Patient brought to ED via [facility] from [facility].  Complaining of chest pain.  Alert and oriented, skin warm and dry.  On oxygen - switched to our tank at 2L via [place].

## 2018-06-14 NOTE — ED Notes (Signed)
Pt to xray at this time.

## 2018-06-14 NOTE — Telephone Encounter (Signed)
Noted  

## 2018-06-14 NOTE — ED Provider Notes (Signed)
[facility] Emergency Department Provider Note   ____________________________________________   First MD Initiated Contact with Patient 06/14/18 1112     (approximate)  I have reviewed the triage vital signs and the nursing notes.   HISTORY  Chief Complaint Chest Pain and Shortness of Breath    HPI Austin Patterson is a 62 y.o. male who reports pain in the right lower chest radiating across the bottom of the chest starting last night.  He cannot say what brought it on.  It is not worse with exercise.  He does get short of breath with exercise but that is been going on for some time.  He has pulmonary fibrosis and is on constant O2.  His pain is not worse with eating either as far as he can tell.  He supposed to get infusions for his pulmonary fibrosis but has not gotten any for 3 weeks as he had to change insurance companies when he went on Medicare and the current insurance company has not improved approved the infusions yet.  Patient reports he gets nebulizer treatments every day.  Usually he coughs up some phlegm afterwards which then clears but today he has not been able to clear all the phlegm out.  What he is producing is brown or black like he produced when he has last pneumonia.  He is not running a fever.  Past Medical History:  Diagnosis Date  . COPD (chronic obstructive pulmonary disease) ([place])   . Hyperlipidemia   . Hypertension   . Pulmonary fibrosis ([place]) 11/2015    Patient Active Problem List   Diagnosis Date Noted  . Hypomagnesemia 03/31/2017  . Hiatal hernia   . Reflux esophagitis   . Hematemesis without nausea   . Hyponatremia 09/21/2016  . Pneumonia 12/09/2015  . COPD with acute exacerbation ([place]) 10/14/2015    Past Surgical History:  Procedure Laterality Date  . CORONARY STENT PLACEMENT    . ESOPHAGOGASTRODUODENOSCOPY (EGD) WITH PROPOFOL N/A 09/23/2016   Procedure: ESOPHAGOGASTRODUODENOSCOPY (EGD) WITH PROPOFOL;  Surgeon:  [name], MD;  Location: [facility] ENDOSCOPY;  Service: Endoscopy;  Laterality: N/A;  . SHOULDER ACROMIOPLASTY      Prior to Admission medications   Medication Sig Start Date End Date Taking? Authorizing Provider  albuterol (PROVENTIL HFA;VENTOLIN HFA) 108 (90 Base) MCG/ACT inhaler Inhale 2 puffs into the lungs every 6 (six) hours as needed for wheezing or shortness of breath. 01/29/17   Darel Hong, MD  amLODipine (NORVASC) 10 MG tablet Take 1 tablet (10 mg total) by mouth daily. 08/19/16   Tawni Millers, MD  aspirin EC 81 MG tablet Take 1 tablet (81 mg total) by mouth daily. 08/19/16   Tawni Millers, MD  atorvastatin (LIPITOR) 40 MG tablet Take 1 tablet (40 mg total) by mouth at bedtime. 08/19/16   Tawni Millers, MD  calcium-vitamin D (OSCAL WITH D) 500-200 MG-UNIT tablet Take 2 tablets by mouth daily with breakfast. 09/24/16   Fritzi Mandes, MD  cefUROXime (CEFTIN) 500 MG tablet Take 1 tablet (500 mg total) by mouth 2 (two) times daily for 10 days. 06/14/18 06/24/18  Nena Polio, MD  cetirizine (ZYRTEC) 10 MG tablet Take 10 mg by mouth at bedtime. 08/19/16   [provider]  clopidogrel (PLAVIX) 75 MG tablet Take 1 tablet (75 mg total) by mouth daily. 08/19/16   Tawni Millers, MD  doxycycline (VIBRAMYCIN) 100 MG capsule Take 1 capsule (100 mg total) by mouth 2 (two) times daily. 06/14/18  Nena Polio, MD  losartan-hydrochlorothiazide (HYZAAR) 100-12.5 MG tablet Take 1 tablet by mouth daily. 08/19/16   Tawni Millers, MD  magnesium oxide (MAG-OX) 400 (241.3 Mg) MG tablet Take 1 tablet (400 mg total) by mouth daily. 06/18/17   Hillary Bow, MD  metoprolol succinate (TOPROL-XL) 50 MG 24 hr tablet Take 1 tablet (50 mg total) by mouth daily. 08/19/16   Tawni Millers, MD  Multiple Vitamin (MULTIVITAMIN WITH MINERALS) TABS tablet Take 1 tablet by mouth daily. 09/24/16   Fritzi Mandes, MD  pantoprazole (PROTONIX) 40 MG tablet Take 1 tablet (40 mg total) by mouth 2 (two) times daily. 09/23/16    Fritzi Mandes, MD  potassium chloride 20 MEQ TBCR Take 20 mEq by mouth daily. 06/18/17   Hillary Bow, MD  Spacer/Aero Chamber Mouthpiece MISC 1 each by Does not apply route every 4 (four) hours as needed (wheezing). 01/29/17   Darel Hong, MD  umeclidinium-vilanterol [facility] ELLIPTA) 62.5-25 MCG/INH AEPB INHALE ONE PUFF INTO THE LUNGS DAILY 01/07/17   Flora Lipps, MD    Allergies Patient has no known allergies.  Family History  Problem Relation Age of Onset  . Heart disease Mother     Social History Social History   Tobacco Use  . Smoking status: Former Smoker    Last attempt to quit: 10/14/2015    Years since quitting: 2.6  . Smokeless tobacco: Former Network engineer Use Topics  . Alcohol use: Yes    Alcohol/week: 28.0 standard drinks    Types: 28 Cans of beer per week    Comment: 6 12 oz cans of beer/day  . Drug use: No    Review of Systems  Constitutional: No fever/chills Eyes: No visual changes. ENT: No sore throat. Cardiovascular:  chest pain. Respiratory:  shortness of breath. Gastrointestinal: No abdominal pain.  No nausea, no vomiting.  No diarrhea.  No constipation. Genitourinary: Negative for dysuria. Musculoskeletal: Negative for back pain. Skin: Negative for rash. Neurological: Negative for headaches, focal weakness   ____________________________________________   PHYSICAL EXAM:  VITAL SIGNS: ED Triage Vitals  Enc Vitals Group     BP 06/14/18 1026 (!) 151/80     Pulse Rate 06/14/18 1026 (!) 109     Resp 06/14/18 1026 20     Temp 06/14/18 1026 98.3 F (36.8 C)     Temp Source 06/14/18 1026 Oral     SpO2 06/14/18 1026 97 %     Weight 06/14/18 1027 194 lb (88 kg)     Height 06/14/18 1027 5\' 7"  (1.702 m)     Head Circumference --      Peak Flow --      Pain Score 06/14/18 1027 9     Pain Loc --      Pain Edu? --      Excl. in [place]? --     Constitutional: Alert and oriented. Well appearing and in no acute distress. Eyes: Conjunctivae are  normal. Head: Atraumatic. Nose: No congestion/rhinnorhea. Mouth/Throat: Mucous membranes are moist.  Oropharynx non-erythematous. Neck: No stridor.  Cardiovascular: Normal rate, regular rhythm. Grossly normal heart sounds.  Good peripheral circulation. Respiratory: Normal respiratory effort.  No retractions. Lungs crackles in the bases bilaterally Gastrointestinal: Soft tender to palpation under the ribs across the abdomen worse in the right upper quadrant no distention. No abdominal bruits. No CVA tenderness. Musculoskeletal: No lower extremity tenderness 1+ edema bilaterally.  . Neurologic:  Normal speech and language. No gross focal neurologic deficits are appreciated Skin:  Skin is warm, dry and intact. No rash noted. Psychiatric: Mood and affect are normal. Speech and behavior are normal.  ____________________________________________   LABS (all labs ordered are listed, but only abnormal results are displayed)  Labs Reviewed  BASIC METABOLIC PANEL - Abnormal; Notable for the following components:      Result Value   Sodium 130 (*)    Chloride 90 (*)    Glucose, Bld 133 (*)    Calcium 8.1 (*)    All other components within normal limits  CBC - Abnormal; Notable for the following components:   RBC 3.51 (*)    Hemoglobin 12.9 (*)    HCT 36.4 (*)    MCV 103.4 (*)    MCH 36.8 (*)    All other components within normal limits  DIFFERENTIAL - Abnormal; Notable for the following components:   Lymphs Abs 0.8 (*)    All other components within normal limits  TROPONIN I  HEPATIC FUNCTION PANEL   ____________________________________________  EKG  EKG read interpreted by me shows sinus tachycardia rate of 108 normal axis no acute ST-T wave changes ____________________________________________  RADIOLOGY  ED MD interpretation: Chest x-ray read by radiology reviewed by me.  Radiology reads a left lower lobe infiltrate.  Official radiology report(s): Dg Chest 2 View  Result  Date: 06/14/2018 CLINICAL DATA:  Chest pain and pressure under LEFT breast, shortness of breath since onset of pain, history COPD, hypertension, pulmonary fibrosis EXAM: CHEST - 2 VIEW COMPARISON:  04/07/2018 FINDINGS: Normal heart size, mediastinal contours, and pulmonary vascularity. Increased markings at LEFT base versus previous exam suspicious for pneumonia. Scarring in RIGHT upper lobe. Emphysematous/bullous disease changes in the lateral mid RIGHT lung unchanged. Minimal RIGHT basilar scarring. No acute infiltrate, pleural effusion or pneumothorax. Bones unremarkable. IMPRESSION: Emphysematous changes with probable scarring RIGHT upper lobe at RIGHT base. Increased LEFT basilar markings suspicious for pneumonia. Electronically Signed   By: Lavonia Dana M.D.   On: 06/14/2018 11:12   US Abdomen Limited Ruq  Result Date: 06/14/2018 CLINICAL DATA:  Right upper quadrant pain. EXAM: ULTRASOUND ABDOMEN LIMITED RIGHT UPPER QUADRANT COMPARISON:  06/17/2017. FINDINGS: Gallbladder: No gallstones or wall thickening visualized. No sonographic Murphy sign noted by sonographer. Common bile duct: Diameter: 4.8 mm Liver: Increased echogenicity consistent fatty infiltration and/or hepatocellular disease. No focal hepatic abnormality identified. Portal vein is patent on color Doppler imaging with normal direction of blood flow towards the liver. IMPRESSION: 1. Increased hepatic echogenicity consistent fatty infiltration or hepatocellular disease. No focal hepatic abnormality identified. 2. Exam otherwise unremarkable. No gallstones or biliary distention. Electronically Signed   By: Marcello Moores  Register   On: 06/14/2018 12:13    ____________________________________________   PROCEDURES  Procedure(s) performed:  Procedures  Critical Care performed:   ____________________________________________   INITIAL IMPRESSION / ASSESSMENT AND PLAN / ED COURSE  Patient in no distress with good O2 sats on his usual amount of  oxygen.  Lab work looks acceptable according to the risk profile he is at very low risk for death.  I will discharge him on p.o. antibiotics follow-up tomorrow with his doctor return if he is worse     Clinical Course as of Jun 14 2050  Tue Jun 14, 2018  1119 Differential [PM]    Clinical Course User Index [PM] Nena Polio, MD     ____________________________________________   FINAL CLINICAL IMPRESSION(S) / ED DIAGNOSES  Final diagnoses:  Community acquired pneumonia, unspecified laterality     ED Discharge Orders  Ordered    cefUROXime (CEFTIN) 500 MG tablet  2 times daily     06/14/18 1236    doxycycline (VIBRAMYCIN) 100 MG capsule  2 times daily     06/14/18 1236           Note:  This document was prepared using Dragon voice recognition software and may include unintentional dictation errors.    Nena Polio, MD 06/14/18 2052

## 2018-06-14 NOTE — ED Triage Notes (Signed)
Pt reports started with cp under his left breast. Pt reports the pain is constant and pressure like. Pt reports is on oxygen all the time and has felt SOB since the pain started .

## 2018-06-14 NOTE — Telephone Encounter (Signed)
Scanned into docs

## 2018-06-14 NOTE — ED Notes (Signed)
Pt to [place]. ABCs intact. NAD.

## 2018-06-14 NOTE — ED Notes (Signed)
Pt returned from XR. ABCs intact. NAD.

## 2018-06-14 NOTE — Telephone Encounter (Signed)
Medicare ID# [employer] appeals phone  [phone]   Fax [phone]  Spoke with Olean Ree with [employer] and started appeal on Zemaira therapy. Faxed ov notes, PFT, and radiolgy. Appeal marked expedited should hear back in next 72 hours.

## 2018-06-14 NOTE — ED Notes (Signed)
Malinda MD at bedside. 

## 2018-06-20 NOTE — Progress Notes (Signed)
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 - advancing interstitial lung disease, discussed the diagnosis of pulmonary fibrosis, as well as its progressive nature. 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  --He had several side effects with esbriet, and is not interested in starting Ofev, especially since he is paying a lot of pocket for the alpha-1 infusions.    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 Pneumonia.  --Recent ED visit, now on abx, to continue until completion.  - Maintain O2 saturations greater than 88% -Stop Anoro due to cost, cannot afford other inhalers, will see if Brovana through Medicare part B is covered if so he can use this twice daily. -Continue alpha 1 augmentation infusions.  He feels that these are helping his breathing, and therefore would like to continue.  Alpha-1 antitrypsin deficiency. -The patient's phenotype is MZ. - Continues Zemaira infusion  Acute hypoxic respiratory failure-chronic.  -Secondary to above, continue oxygen.    Nicotine abuse. -He quit smoking 09/2015   Date: 06/20/2018  MRN# [id number] Austin Patterson 01-19-1956   Austin Patterson is a 62 y.o. old male seen in follow up for chief complaint of  Chief Complaint  Patient presents with  . Emphysema     HPI:   The patient is a 62 year old male with a history advanced COPD, interstitial lung disease.  He has had an ED visit on 06/14/2018 to have a pneumonia.  He was given a course of cefuroxime and doxycycline. He has resumed Zemaira, he feels that his breathing is short. He is using albuterol nebs once or twice per day.  He is off Anoro due to cost and is only using nebs at this time.  He remains on 3L at all times and increases with activity  occasionally.  **Chest xray 06/14/18>> images personally reviewed, chronic changes of lung scarring.  **alpha-1 antitrypsin>> serum level was 158. 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    Pulmonary function testing 01/29/16: Spirometry FEV1 was 1.69 L which is 52% of predicted, there was no significant improvement with bronchodilator therapy. The FVC was 61% of predicted, the FEV to FVC ratio was 65%. Lung volumes: Residual volume was 60% of predicted, TLC was 59% of predicted, RV/TLC ratio was 112% of predicted. Diffusion capacity: 26% of predicted. Interpretation. 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  **CT chest 01/29/16>> continued severe interstitial changes throughout both lungs, worst in the upper lobes. The previously seen pneumonic changes from 12/11/15 in the left base and to a lesser degree in the right base have improved. significant bullous emphysema as well as advancing interstitial changes seen onCT chest 12/11/15, in particular his emphysematous changes on the CT appeared to have advanced significantly since the previous CT, which was 8 months prior.    Medication:   Outpatient Encounter Medications as of 06/21/2018  Medication Sig  . albuterol (PROVENTIL HFA;VENTOLIN HFA) 108 (90 Base) MCG/ACT inhaler Inhale 2 puffs into the lungs every 6 (six) hours as needed for wheezing or shortness of breath.  [name] amLODipine (NORVASC) 10 MG tablet Take 1 tablet (10 mg total) by mouth daily.  [name] aspirin EC 81 MG tablet Take 1 tablet (81 mg total) by mouth daily.  [name] atorvastatin (  LIPITOR) 40 MG tablet Take 1 tablet (40 mg total) by mouth at bedtime.  . calcium-vitamin D (OSCAL WITH D) 500-200 MG-UNIT tablet Take 2 tablets by mouth daily with breakfast.  . cefUROXime (CEFTIN) 500 MG tablet Take 1 tablet (500 mg total) by mouth 2 (two) times daily for 10  days.  . cetirizine (ZYRTEC) 10 MG tablet Take 10 mg by mouth at bedtime.  . clopidogrel (PLAVIX) 75 MG tablet Take 1 tablet (75 mg total) by mouth daily.  [name] doxycycline (VIBRAMYCIN) 100 MG capsule Take 1 capsule (100 mg total) by mouth 2 (two) times daily.  [name] losartan-hydrochlorothiazide (HYZAAR) 100-12.5 MG tablet Take 1 tablet by mouth daily.  . magnesium oxide (MAG-OX) 400 (241.3 Mg) MG tablet Take 1 tablet (400 mg total) by mouth daily.  . metoprolol succinate (TOPROL-XL) 50 MG 24 hr tablet Take 1 tablet (50 mg total) by mouth daily.  . Multiple Vitamin (MULTIVITAMIN WITH MINERALS) TABS tablet Take 1 tablet by mouth daily.  . pantoprazole (PROTONIX) 40 MG tablet Take 1 tablet (40 mg total) by mouth 2 (two) times daily.  . potassium chloride 20 MEQ TBCR Take 20 mEq by mouth daily.  [name] Spacer/Aero Chamber Mouthpiece MISC 1 each by Does not apply route every 4 (four) hours as needed (wheezing).  [name] umeclidinium-vilanterol (ANORO ELLIPTA) 62.5-25 MCG/INH AEPB INHALE ONE PUFF INTO THE LUNGS DAILY   No facility-administered encounter medications on file as of 06/21/2018.      Allergies:  Patient has no known allergies.  Review of Systems:  Constitutional: Feels well. Cardiovascular: No chest pain.  Pulmonary: Denies hemoptysis. The remainder of systems were reviewed and were found to be negative other than what is documented in the HPI.    Physical Examination:   VS: BP (!) 148/72 (BP Location: Left Arm, Cuff Size: Large)   Pulse (!) 102   Resp 16   Ht 5\' 7"  (1.702 m)   Wt 195 lb (88.5 kg)   SpO2 91%   BMI 30.54 kg/m   General Appearance: No distress  Neuro:without focal findings, mental status, speech normal, alert and oriented HEENT: PERRLA, EOM intact Pulmonary: Decreased air entry bilaterally.   CardiovascularNormal S1,S2.  No m/r/g.  Abdomen: Benign, Soft, non-tender, No masses Renal:  No costovertebral tenderness  GU:  No performed at this time. Endoc: No evident  thyromegaly, no signs of acromegaly or Cushing features Skin:   warm, no rashes, no ecchymosis  Extremities: normal, no cyanosis, clubbing.     LABORATORY PANEL:   CBC Recent Labs  Lab 06/14/18 1052  WBC 8.0  HGB 12.9*  HCT 36.4*  PLT 309   ------------------------------------------------------------------------------------------------------------------  Chemistries  Recent Labs  Lab 06/14/18 1052  NA 130*  K 3.5  CL 90*  CO2 27  GLUCOSE 133*  BUN 11  CREATININE 0.74  CALCIUM 8.1*  AST 36  ALT 23  ALKPHOS 45  BILITOT 1.1   ------------------------------------------------------------------------------------------------------------------  Cardiac Enzymes Recent Labs  Lab 06/14/18 1052  TROPONINI <0.03   ------------------------------------------------------------  RADIOLOGY:   No results found for this or any previous visit. Results for orders placed during the hospital encounter of 09/03/15  DG Chest 2 View   Narrative CLINICAL DATA:  Chest pain for 1 month.  EXAM: CHEST  2 VIEW  COMPARISON:  01/23/2015  FINDINGS: Lungs are hyperaerated and clear, other than scarring at the right lateral base. Normal heart size. No pneumothorax. Chronic right rib deformities. Stable thoracic spine.  IMPRESSION: No active cardiopulmonary disease.  Electronically Signed   By: Marybelle Killings M.D.   On: 09/03/2015 07:56    ------------------------------------------------------------------------------------------------------------------  Thank  you for allowing [facility] to assist in the care of your patient. Our recommendations are noted above.  Please contact us if we can be of further service.  Marda Stalker, M.D., F.C.C.P.  Board Certified in Internal Medicine, Pulmonary Medicine, [place], and Sleep Medicine.  [facility] Office Number: [phone]   06/20/2018 *

## 2018-06-21 ENCOUNTER — Ambulatory Visit (INDEPENDENT_AMBULATORY_CARE_PROVIDER_SITE_OTHER): Payer: Medicare HMO | Admitting: Internal Medicine

## 2018-06-21 ENCOUNTER — Encounter: Payer: Self-pay | Admitting: Internal Medicine

## 2018-06-21 VITALS — BP 148/72 | HR 102 | Resp 16 | Ht 67.0 in | Wt 195.0 lb

## 2018-06-21 DIAGNOSIS — J438 Other emphysema: Secondary | ICD-10-CM | POA: Diagnosis not present

## 2018-06-21 DIAGNOSIS — J9611 Chronic respiratory failure with hypoxia: Secondary | ICD-10-CM

## 2018-06-21 DIAGNOSIS — J8410 Pulmonary fibrosis, unspecified: Secondary | ICD-10-CM | POA: Diagnosis not present

## 2018-06-21 MED ORDER — ARFORMOTEROL TARTRATE 15 MCG/2ML IN NEBU: 15.0000 ug | INHALATION_SOLUTION | Freq: Two times a day (BID) | RESPIRATORY_TRACT | 6 refills | Status: DC

## 2018-06-21 NOTE — Patient Instructions (Addendum)
Start Brovana nebs twice daily, (through [facility]), continue albuterol nebs as needed.

## 2018-06-22 ENCOUNTER — Telehealth: Payer: Self-pay | Admitting: Internal Medicine

## 2018-06-22 DIAGNOSIS — J849 Interstitial pulmonary disease, unspecified: Secondary | ICD-10-CM

## 2018-06-22 NOTE — Telephone Encounter (Signed)
Order placed for stationary 02 tanks in the home. Patient also has a [employer] portable that he is complaining about having to keep plugged in at all times. Patient states it is making his [employer] higher. Nothing further needed.

## 2018-06-22 NOTE — Telephone Encounter (Signed)
Pt c/o medication issue:  1. Name of Medication: Oxygen   2. How are you currently taking this medication (dosage and times per day)? Concentrator   3. Are you having a reaction (difficulty breathing--STAT)? no  4. What is your medication issue? Patient wants to change to tanks [employer] needs orders

## 2018-07-18 ENCOUNTER — Ambulatory Visit
Admission: RE | Admit: 2018-07-18 | Discharge: 2018-07-18 | Disposition: A | Discharge status: Home / Self Care | Payer: Medicare HMO | Source: Ambulatory Visit | Attending: Physician Assistant | Admitting: Physician Assistant

## 2018-07-18 ENCOUNTER — Other Ambulatory Visit: Payer: Self-pay | Admitting: Physician Assistant

## 2018-07-18 DIAGNOSIS — J449 Chronic obstructive pulmonary disease, unspecified: Secondary | ICD-10-CM

## 2018-07-18 DIAGNOSIS — Z8701 Personal history of pneumonia (recurrent): Secondary | ICD-10-CM | POA: Diagnosis not present

## 2018-11-02 NOTE — Progress Notes (Signed)
* [facility]     Assessment and Plan: 63 year old male with bullous emphysema, interstitial lung disease, chronic respiratory failure.   Idiopathic pulmonary fibrosis. - advancing interstitial lung disease, discussed the diagnosis of pulmonary fibrosis, as well as its progressive nature. 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  -In terms of his medical regimen the patient is very particular about what he will and will not take, and is not interested in using medications I have several side effects. --He had several side effects with esbriet and then was discontinued.  He is not interested in starting Ofev, especially since he is paying a lot of pocket for the alpha-1 infusions.    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  Acute bronchitis. - Maintain O2 saturations greater than 88% -We will give nebulizer treatment today. -Stopped Anoro due to cost, cannot afford other inhalers, Garlon Hatchet was not covered. -He is not very good about using his albuterol nebulizer very often, currently he is only using it every few days.  Currently he is having an episode of acute bronchitis, I encouraged him to use it 3-4 times a day while he is sick, continue to use it at least 3 times a day after that. -I am going to prescribe Perforomist and Yupelri, we will see if they are covered. -Continue alpha 1 augmentation infusions.  He feels that these are helping his breathing, and therefore would like to continue.  He is currently on Zemaira.  Alpha-1 antitrypsin deficiency. -The patient's phenotype is MZ. - Continues Zemaira infusion   Prior nicotine abuse. -He quit smoking 09/2015 he is no longer smoking or vaping.  Greater than 50% of the 40 minute visit was spent in counseling/coordination of care regarding End stage COPD, pulmonary fibrosis.    Date: 11/02/2018  MRN# [id number] Austin Patterson 1956-04-01   Austin Presley  Patterson is a 63 y.o. old male seen in follow up for chief complaint of  Chief Complaint  Patient presents with  . Follow-up    breathing has been up and down  . Shortness of Breath    all the time right now, especially with exertion   . Wheezing    very little, right side rattling,   . Chest Pain    sternal pain     HPI:   The patient is a 64 year old male with a history advanced COPD, interstitial lung disease.  He has had no further hospital admissions or exacerbation since his last visit.  Though he feels currently that he is having some chest congestion with increased sputum production and chest tightness. He has resumed Zemaira. He is using albuterol nebs about once every day or so.  He has generally not been very good about taking prescribed medications regularly. He remains on 3L at all times and increases with activity occasionally.  He was tried on various long acting inhalers but it was not covered. Brovana and nebulized pulmicort were not covered.   **Chest xray 06/14/18>> images personally reviewed, chronic changes of lung scarring.  **alpha-1 antitrypsin>> serum level was 158. 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    Pulmonary function testing 01/29/16: Spirometry FEV1 was 1.69 L which is 52% of predicted, there was no significant improvement with bronchodilator therapy. The FVC was 61% of predicted, the FEV to FVC ratio was 65%. Lung volumes: Residual volume was 60% of predicted, TLC was 59% of predicted, RV/TLC ratio was 112% of predicted. Diffusion capacity: 26% of predicted. Interpretation.  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  **CT chest 01/29/16>> continued severe interstitial changes throughout both lungs, worst in the upper lobes. The previously seen pneumonic changes from 12/11/15 in the left base and to a lesser  degree in the right base have improved. significant bullous emphysema as well as advancing interstitial changes seen onCT chest 12/11/15, in particular his emphysematous changes on the CT appeared to have advanced significantly since the previous CT, which was 8 months prior.    Medication:   Outpatient Encounter Medications as of 11/03/2018  Medication Sig  . albuterol (PROVENTIL HFA;VENTOLIN HFA) 108 (90 Base) MCG/ACT inhaler Inhale 2 puffs into the lungs every 6 (six) hours as needed for wheezing or shortness of breath.  [name] amLODipine (NORVASC) 10 MG tablet Take 1 tablet (10 mg total) by mouth daily.  [name] arformoterol (BROVANA) 15 MCG/2ML NEBU Take 2 mLs (15 mcg total) by nebulization 2 (two) times daily.  [name] aspirin EC 81 MG tablet Take 1 tablet (81 mg total) by mouth daily.  [name] atorvastatin (LIPITOR) 40 MG tablet Take 1 tablet (40 mg total) by mouth at bedtime.  . calcium-vitamin D (OSCAL WITH D) 500-200 MG-UNIT tablet Take 2 tablets by mouth daily with breakfast.  . cetirizine (ZYRTEC) 10 MG tablet Take 10 mg by mouth at bedtime.  . clopidogrel (PLAVIX) 75 MG tablet Take 1 tablet (75 mg total) by mouth daily.  [name] doxycycline (VIBRAMYCIN) 100 MG capsule Take 1 capsule (100 mg total) by mouth 2 (two) times daily.  [name] losartan-hydrochlorothiazide (HYZAAR) 100-12.5 MG tablet Take 1 tablet by mouth daily.  . magnesium oxide (MAG-OX) 400 (241.3 Mg) MG tablet Take 1 tablet (400 mg total) by mouth daily.  . metoprolol succinate (TOPROL-XL) 50 MG 24 hr tablet Take 1 tablet (50 mg total) by mouth daily.  . Multiple Vitamin (MULTIVITAMIN WITH MINERALS) TABS tablet Take 1 tablet by mouth daily.  . pantoprazole (PROTONIX) 40 MG tablet Take 1 tablet (40 mg total) by mouth 2 (two) times daily.  . potassium chloride 20 MEQ TBCR Take 20 mEq by mouth daily.  [name] Spacer/Aero Chamber Mouthpiece MISC 1 each by Does not apply route every 4 (four) hours as needed (wheezing).  [name] umeclidinium-vilanterol (ANORO ELLIPTA)  62.5-25 MCG/INH AEPB INHALE ONE PUFF INTO THE LUNGS DAILY   No facility-administered encounter medications on file as of 11/03/2018.      Allergies:  Patient has no known allergies.  Review of Systems:  Constitutional: Feels well. Cardiovascular: Denies chest pain, exertional chest pain.  Pulmonary: Denies hemoptysis, pleuritic chest pain.   The remainder of systems were reviewed and were found to be negative other than what is documented in the HPI.    Physical Examination:   VS: BP 138/90 (BP Location: Left Arm, Cuff Size: Normal)   Pulse 98   Ht 5\' 7"  (1.702 m)   Wt 203 lb (92.1 kg)   SpO2 96%   BMI 31.79 kg/m   General Appearance: No distress  Neuro:without focal findings, mental status, speech normal, alert and oriented HEENT: PERRLA, EOM intact Pulmonary: Scattered bilateral crackles. CardiovascularNormal S1,S2.  No m/r/g.  Abdomen: Benign, Soft, non-tender, No masses Renal:  No costovertebral tenderness  GU:  No performed at this time. Endoc: No evident thyromegaly, no signs of acromegaly or Cushing features Skin:   warm, no rashes, no ecchymosis  Extremities: normal, no cyanosis, clubbing.  LABORATORY PANEL:   CBC No results for input(s): WBC, HGB, HCT, PLT in the last 168 hours. ------------------------------------------------------------------------------------------------------------------  Chemistries  No results for input(s): NA, K, CL, CO2, GLUCOSE, BUN, CREATININE, CALCIUM, MG, AST, ALT, ALKPHOS, BILITOT in the last 168 hours.  Invalid input(s): GFRCGP ------------------------------------------------------------------------------------------------------------------  Cardiac Enzymes No results for input(s): TROPONINI in the last 168 hours. ------------------------------------------------------------  RADIOLOGY:   No results found for this or any previous visit. Results for orders placed during the hospital encounter of 09/03/15  DG Chest 2 View    Narrative CLINICAL DATA:  Chest pain for 1 month.  EXAM: CHEST  2 VIEW  COMPARISON:  01/23/2015  FINDINGS: Lungs are hyperaerated and clear, other than scarring at the right lateral base. Normal heart size. No pneumothorax. Chronic right rib deformities. Stable thoracic spine.  IMPRESSION: No active cardiopulmonary disease.   Electronically Signed   By: Marybelle Killings M.D.   On: 09/03/2015 07:56    ------------------------------------------------------------------------------------------------------------------  Thank  you for allowing [facility] to assist in the care of your patient. Our recommendations are noted above.  Please contact us if we can be of further service.  Marda Stalker, M.D., F.C.C.P.  Board Certified in Internal Medicine, Pulmonary Medicine, [employer], and Sleep Medicine.  [facility] Office Number: [phone]   11/02/2018 *

## 2018-11-03 ENCOUNTER — Ambulatory Visit: Payer: Medicare HMO | Admitting: Internal Medicine

## 2018-11-03 ENCOUNTER — Encounter: Payer: Self-pay | Admitting: Internal Medicine

## 2018-11-03 VITALS — BP 138/90 | HR 98 | Ht 67.0 in | Wt 203.0 lb

## 2018-11-03 DIAGNOSIS — J9611 Chronic respiratory failure with hypoxia: Secondary | ICD-10-CM | POA: Diagnosis not present

## 2018-11-03 DIAGNOSIS — J8410 Pulmonary fibrosis, unspecified: Secondary | ICD-10-CM | POA: Diagnosis not present

## 2018-11-03 DIAGNOSIS — J849 Interstitial pulmonary disease, unspecified: Secondary | ICD-10-CM | POA: Diagnosis not present

## 2018-11-03 MED ORDER — REVEFENACIN 175 MCG/3ML IN SOLN: 3.0000 mL | Freq: Once | RESPIRATORY_TRACT | 1 refills | Status: AC

## 2018-11-03 MED ORDER — FORMOTEROL FUMARATE 20 MCG/2ML IN NEBU: 20.0000 ug | INHALATION_SOLUTION | Freq: Two times a day (BID) | RESPIRATORY_TRACT | 5 refills | Status: DC

## 2018-11-03 MED ORDER — IPRATROPIUM-ALBUTEROL 0.5-2.5 (3) MG/3ML IN SOLN
3.0000 mL | Freq: Once | RESPIRATORY_TRACT | Status: AC
  Administered 2018-11-03: 3 mL via RESPIRATORY_TRACT

## 2018-11-03 NOTE — Addendum Note (Signed)
Addended by: Stephanie Coup on: 11/03/2018 09:52 AM   Modules accepted: Orders

## 2018-11-03 NOTE — Patient Instructions (Addendum)
Nebulizer treatment today. I am going to prescribe long acting nebulizer medications and see if they are covered.  Austin Patterson once daily -Perfomist twice daily.   In the meantime use your albuterol nebulizer 3-4 times daily, especially if you feel chest congestion.  Lung function test before next visit.

## 2018-11-03 NOTE — Addendum Note (Signed)
Addended by: Stephanie Coup on: 11/03/2018 09:56 AM   Modules accepted: Orders

## 2019-03-03 ENCOUNTER — Telehealth: Payer: Self-pay | Admitting: Internal Medicine

## 2019-03-03 NOTE — Addendum Note (Signed)
Addended by: Maryanna Shape A on: 03/03/2019 09:29 AM   Modules accepted: Orders

## 2019-03-03 NOTE — Telephone Encounter (Addendum)
Spoke to Stem with [employer], who is requesting that updated med list be faxed to [phone]. I have spoken to pt and received verbal to fax over med list.  Med list has been faxed to provided fax number. Nothing further is needed at this time.

## 2019-03-03 NOTE — Telephone Encounter (Signed)
Can switch to advair 250 and spiriva.

## 2019-03-03 NOTE — Telephone Encounter (Signed)
Received call back from sherry, who stated that upon further research it does not appear that pt is on any long acting medications. Per last OV note pt was instructed to do Yupelri and perforomist. Per Judeen Hammans pt can not afford these medications. Judeen Hammans stated that [employer] works with a program that she can help with assistance with Inhalers. Judeen Hammans states that spiriva, advair and spiriva are all under 15 dollars.  DR please advise if okay to switch?

## 2019-03-07 MED ORDER — TIOTROPIUM BROMIDE MONOHYDRATE 18 MCG IN CAPS: 18.0000 ug | ORAL_CAPSULE | Freq: Every day | RESPIRATORY_TRACT | 11 refills | Status: DC

## 2019-03-07 MED ORDER — FLUTICASONE-SALMETEROL 250-50 MCG/DOSE IN AEPB: 1.0000 | INHALATION_SPRAY | Freq: Two times a day (BID) | RESPIRATORY_TRACT | 11 refills | Status: DC

## 2019-03-07 NOTE — Telephone Encounter (Addendum)
Caren Griffins with [facility] is aware of below recommendations. Rx for advair 250 and spiriva handihaler has been sent to preferred pharmacy. Nothing further is needed.

## 2019-03-07 NOTE — Telephone Encounter (Signed)
What dose of spiriva?

## 2019-03-07 NOTE — Telephone Encounter (Signed)
handihaler-- only comes in one dose of once daily. If insurance will only cover the respimat, then 2.5 dose,  2 puffs once daily.

## 2019-03-07 NOTE — Addendum Note (Signed)
Addended by: Maryanna Shape A on: 03/07/2019 10:23 AM   Modules accepted: Orders

## 2019-03-15 ENCOUNTER — Telehealth: Payer: Self-pay

## 2019-03-15 NOTE — Telephone Encounter (Signed)
ATC to give verbal order, LM to call back.

## 2019-03-15 NOTE — Telephone Encounter (Signed)
May provide verbal order.

## 2019-03-15 NOTE — Telephone Encounter (Signed)
Eintou called from [facility] in regards to patients Austin Patterson. She stated that she needs a verbal order to provide the patient with an ANA Kit (anaphylaxis kit) for the home.   Her call back number is [phone].   DR please advise

## 2019-03-15 NOTE — Telephone Encounter (Signed)
Spoke with Beth at [facility], the order has been signed and faxed, so no verbal order needed. Will close encounter.

## 2019-03-15 NOTE — Telephone Encounter (Signed)
Spoke to Nia at [facility], Eintou is unavailable. She will call us back tomorrow. Correct number is [phone].

## 2019-03-31 ENCOUNTER — Other Ambulatory Visit: Payer: Self-pay

## 2019-04-17 ENCOUNTER — Other Ambulatory Visit: Payer: Self-pay

## 2019-04-17 ENCOUNTER — Other Ambulatory Visit
Admission: RE | Admit: 2019-04-17 | Discharge: 2019-04-17 | Disposition: A | Discharge status: Home / Self Care | Payer: Medicare HMO | Source: Ambulatory Visit | Attending: Internal Medicine | Admitting: Internal Medicine

## 2019-04-17 DIAGNOSIS — Z1159 Encounter for screening for other viral diseases: Secondary | ICD-10-CM | POA: Diagnosis not present

## 2019-04-17 DIAGNOSIS — Z01812 Encounter for preprocedural laboratory examination: Secondary | ICD-10-CM | POA: Insufficient documentation

## 2019-04-18 LAB — NOVEL CORONAVIRUS, NAA (HOSP ORDER, SEND-OUT TO REF LAB; TAT 18-24 HRS): SARS-CoV-2, NAA: NOT DETECTED

## 2019-04-20 ENCOUNTER — Ambulatory Visit: Payer: Medicare HMO | Attending: Internal Medicine

## 2019-04-20 ENCOUNTER — Other Ambulatory Visit: Payer: Self-pay

## 2019-04-20 ENCOUNTER — Ambulatory Visit: Payer: Medicare HMO

## 2019-04-20 DIAGNOSIS — Z87891 Personal history of nicotine dependence: Secondary | ICD-10-CM | POA: Insufficient documentation

## 2019-04-20 DIAGNOSIS — J849 Interstitial pulmonary disease, unspecified: Secondary | ICD-10-CM | POA: Diagnosis not present

## 2019-04-25 ENCOUNTER — Telehealth: Payer: Self-pay | Admitting: Internal Medicine

## 2019-04-25 NOTE — Progress Notes (Signed)
* [facility]     Assessment and Plan: 63 year old male with bullous emphysema, interstitial lung disease, chronic respiratory failure.   Interstitial lung disease-with imaging findings consistent with IPF. - advancing interstitial lung disease, discussed the diagnosis of pulmonary fibrosis, as well as its progressive nature. 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  --He had several side effects with esbriet, and is not interested in starting Ofev, especially since he is paying a lot of pocket for the alpha-1 infusions.    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 - Maintain O2 saturations greater than 88% -Currently maintained on Advair and Spiriva with assistance from [employer] works.  Discussed the importance of taking his medications regularly. -Continue alpha 1 augmentation infusions.  We discussed potentially stopping with these, however he feels that they are helping him with symptomatically and wants to continue, is willing to forego other medications in order to continue these infusions.  Alpha-1 antitrypsin deficiency. -The patient's phenotype is MZ. - Continues Zemaira infusion  Chronic hypoxic respiratory failure. -Secondary to above, continue oxygen.    Nicotine abuse. -He quit smoking 09/2015  Meds ordered this encounter  Medications  . tiotropium (SPIRIVA HANDIHALER) 18 MCG inhalation capsule    Sig: Place 1 capsule (18 mcg total) into inhaler and inhale daily.    Dispense:  30 capsule    Refill:  2     Date: 04/25/2019  MRN# [id number] Austin Patterson 05-12-1956   Austin Patterson is a 63 y.o. old male seen in follow up for chief complaint of  Chief Complaint  Patient presents with  . Follow-up    breathing has gotten worse since last seen, can't do what he could a year ago, short of breath all the time, 2LPM all the time, sometimes has to increase, lot of  wheezing, states hasnt' had CXR in a while.      HPI:   Austin Patterson is a 63 y.o. male  with a history advanced COPD, interstitial lung disease.   Since his last visit he has had no further ED visits or hospital visits due to respiratory issues.Overall he feels that his breathing has continued to slowly decline.  His management has been challenging over the years as he often does not take his inhalers as prescribed. He remains on oxygen at 2L. Breathing is particularly worse with high humidity.  He is taking advair once per day instead of twice per day, he is not using spiriva at all, he is using proair 3 or 4 times per day.  He tells me that he does not have Spiriva, though note in the chart says it was prescribed back in May. He remains on zemaira infusions every week.   **Chest xray 06/14/18>> images personally reviewed, chronic changes of lung scarring.  **alpha-1 antitrypsin>> serum level was 158. 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   **PFT 04/26/2019>> tracings personally reviewed.  FVC is 64% predicted, FEV1 is 39% predicted, there is 12% improvement in FEV1 with bronchodilator.  Ratio is 85%.  TLC is 100% predicted, RV/TLC ratio is 172% predicted, consistent with severe hyperinflation.  Flow volume loop shows obstruction DLCO is moderately reduced to 55%.  Overall this test shows severe obstructive lung disease with reversibility. Pulmonary function testing 01/29/16: Spirometry FEV1 was 1.69 L which is 52% of predicted, there was no significant improvement with bronchodilator therapy. The FVC was 61% of predicted, the FEV to FVC ratio was 65%. Lung  volumes: Residual volume was 60% of predicted, TLC was 59% of predicted, RV/TLC ratio was 112% of predicted. Diffusion capacity: 26% of predicted. Interpretation. 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  **CT chest 01/29/16>> continued severe interstitial changes throughout both lungs, worst in the upper lobes. The previously seen pneumonic changes from 12/11/15 in the left base and to a lesser degree in the right base have improved. significant bullous emphysema as well as advancing interstitial changes seen onCT chest 12/11/15, in particular his emphysematous changes on the CT appeared to have advanced significantly since the previous CT, which was 8 months prior.    Medication:   Outpatient Encounter Medications as of 04/26/2019  Medication Sig  . albuterol (PROVENTIL HFA;VENTOLIN HFA) 108 (90 Base) MCG/ACT inhaler Inhale 2 puffs into the lungs every 6 (six) hours as needed for wheezing or shortness of breath.  [name] amLODipine (NORVASC) 10 MG tablet Take 1 tablet (10 mg total) by mouth daily.  [name] aspirin EC 81 MG tablet Take 1 tablet (81 mg total) by mouth daily.  [name] atorvastatin (LIPITOR) 40 MG tablet Take 1 tablet (40 mg total) by mouth at bedtime.  . calcium-vitamin D (OSCAL WITH D) 500-200 MG-UNIT tablet Take 2 tablets by mouth daily with breakfast.  . cetirizine (ZYRTEC) 10 MG tablet Take 10 mg by mouth at bedtime.  . clopidogrel (PLAVIX) 75 MG tablet Take 1 tablet (75 mg total) by mouth daily.  [name] doxycycline (VIBRAMYCIN) 100 MG capsule Take 1 capsule (100 mg total) by mouth 2 (two) times daily.  . Fluticasone-Salmeterol (ADVAIR DISKUS) 250-50 MCG/DOSE AEPB Inhale 1 puff into the lungs 2 (two) times daily.  [name] losartan-hydrochlorothiazide (HYZAAR) 100-12.5 MG tablet Take 1 tablet by mouth daily.  . magnesium oxide (MAG-OX) 400 (241.3 Mg) MG tablet Take 1 tablet (400 mg total) by mouth daily.  . metoprolol succinate (TOPROL-XL) 50 MG 24 hr tablet Take 1 tablet (50 mg total) by mouth daily.  . Multiple Vitamin (MULTIVITAMIN WITH MINERALS) TABS tablet Take 1 tablet by mouth daily.  . pantoprazole (PROTONIX) 40 MG tablet Take 1 tablet (40 mg total) by mouth 2 (two) times daily.  .  potassium chloride 20 MEQ TBCR Take 20 mEq by mouth daily.  [name] Spacer/Aero Chamber Mouthpiece MISC 1 each by Does not apply route every 4 (four) hours as needed (wheezing).  [name] tiotropium (SPIRIVA HANDIHALER) 18 MCG inhalation capsule Place 1 capsule (18 mcg total) into inhaler and inhale daily.   No facility-administered encounter medications on file as of 04/26/2019.      Allergies:  Patient has no known allergies.  Review of Systems:  Constitutional: Feels well. Cardiovascular: Denies chest pain, exertional chest pain.  Pulmonary: Denies hemoptysis, pleuritic chest pain.   The remainder of systems were reviewed and were found to be negative other than what is documented in the HPI.    Physical Examination:   VS: BP 122/86 (BP Location: Left Arm, Cuff Size: Normal)   Pulse 87   Temp 98.5 F (36.9 C) (Oral)   Ht 5\' 7"  (1.702 m)   Wt 200 lb 9.6 oz (91 kg)   SpO2 96%   BMI 31.42 kg/m   General Appearance: No distress  Neuro:without focal findings, mental status, speech normal, alert and oriented HEENT: PERRLA, EOM intact Pulmonary: No wheezing, No rales  CardiovascularNormal S1,S2.  No m/r/g.  Abdomen: Benign, Soft, non-tender,  No masses Renal:  No costovertebral tenderness  GU:  No performed at this time. Endoc: No evident thyromegaly, no signs of acromegaly or Cushing features Skin:   warm, no rashes, no ecchymosis  Extremities: normal, no cyanosis, clubbing.     LABORATORY PANEL:   CBC No results for input(s): WBC, HGB, HCT, PLT in the last 168 hours. ------------------------------------------------------------------------------------------------------------------  Chemistries  No results for input(s): NA, K, CL, CO2, GLUCOSE, BUN, CREATININE, CALCIUM, MG, AST, ALT, ALKPHOS, BILITOT in the last 168 hours.  Invalid input(s): GFRCGP ------------------------------------------------------------------------------------------------------------------  Cardiac Enzymes No  results for input(s): TROPONINI in the last 168 hours. ------------------------------------------------------------  RADIOLOGY:   No results found for this or any previous visit. Results for orders placed during the hospital encounter of 09/03/15  DG Chest 2 View   Narrative CLINICAL DATA:  Chest pain for 1 month.  EXAM: CHEST  2 VIEW  COMPARISON:  01/23/2015  FINDINGS: Lungs are hyperaerated and clear, other than scarring at the right lateral base. Normal heart size. No pneumothorax. Chronic right rib deformities. Stable thoracic spine.  IMPRESSION: No active cardiopulmonary disease.   Electronically Signed   By: Marybelle Killings M.D.   On: 09/03/2015 07:56    ------------------------------------------------------------------------------------------------------------------  Thank  you for allowing [facility] to assist in the care of your patient. Our recommendations are noted above.  Please contact us if we can be of further service.  Marda Stalker, M.D., F.C.C.P.  Board Certified in Internal Medicine, Pulmonary Medicine, [place], and Sleep Medicine.  [facility] Office Number: [phone]   04/25/2019 *

## 2019-04-25 NOTE — Telephone Encounter (Signed)
Called patient for COVID-19 pre-screening for in office visit. ° °Have you recently traveled any where out of the local area in the last 2 weeks? No ° °Have you been in close contact with a person diagnosed with COVID-19 or someone awaiting results within the last 2 weeks? No ° °Do you currently have any of the following symptoms? If so, when did they start? °Cough     Diarrhea   Joint Pain °Fever      Muscle Pain   Red eyes °Shortness of breath    Abdominal pain  Vomiting °Loss of smell    Rash    Sore Throat °Headache     Weakness   Bruising or bleeding ° °  °Okay to proceed with visit 04/26/2019  ° ° °

## 2019-04-26 ENCOUNTER — Encounter: Payer: Self-pay | Admitting: Internal Medicine

## 2019-04-26 ENCOUNTER — Other Ambulatory Visit: Payer: Self-pay

## 2019-04-26 ENCOUNTER — Ambulatory Visit: Payer: Medicare HMO | Admitting: Internal Medicine

## 2019-04-26 VITALS — BP 122/86 | HR 87 | Temp 98.5°F | Ht 67.0 in | Wt 200.6 lb

## 2019-04-26 DIAGNOSIS — J849 Interstitial pulmonary disease, unspecified: Secondary | ICD-10-CM

## 2019-04-26 DIAGNOSIS — J438 Other emphysema: Secondary | ICD-10-CM | POA: Diagnosis not present

## 2019-04-26 MED ORDER — SPIRIVA HANDIHALER 18 MCG IN CAPS: 18.0000 ug | ORAL_CAPSULE | Freq: Every day | RESPIRATORY_TRACT | 2 refills | Status: DC

## 2019-04-26 NOTE — Patient Instructions (Addendum)
I would recommend that she use her Advair inhaler twice daily. Recommend that she use Spiriva once daily. Using these inhalers regularly can help maintain her lung function at its current level.

## 2019-08-10 ENCOUNTER — Other Ambulatory Visit: Payer: Self-pay | Admitting: Family

## 2019-08-10 ENCOUNTER — Other Ambulatory Visit: Payer: Self-pay

## 2019-08-10 ENCOUNTER — Ambulatory Visit
Admission: RE | Admit: 2019-08-10 | Discharge: 2019-08-10 | Disposition: A | Discharge status: Home / Self Care | Payer: Medicare HMO | Source: Ambulatory Visit | Attending: Family | Admitting: Family

## 2019-08-10 DIAGNOSIS — R0989 Other specified symptoms and signs involving the circulatory and respiratory systems: Secondary | ICD-10-CM

## 2019-08-30 ENCOUNTER — Inpatient Hospital Stay
Admission: EM | Admit: 2019-08-30 | Discharge: 2019-09-02 | DRG: 193 | Disposition: A | Discharge status: Home Health | Payer: Medicare HMO | Attending: Internal Medicine | Admitting: Internal Medicine

## 2019-08-30 ENCOUNTER — Encounter: Payer: Self-pay | Admitting: Emergency Medicine

## 2019-08-30 ENCOUNTER — Emergency Department: Payer: Medicare HMO

## 2019-08-30 ENCOUNTER — Other Ambulatory Visit: Payer: Self-pay

## 2019-08-30 DIAGNOSIS — I2510 Atherosclerotic heart disease of native coronary artery without angina pectoris: Secondary | ICD-10-CM | POA: Diagnosis present

## 2019-08-30 DIAGNOSIS — J84112 Idiopathic pulmonary fibrosis: Secondary | ICD-10-CM | POA: Diagnosis present

## 2019-08-30 DIAGNOSIS — Z79899 Other long term (current) drug therapy: Secondary | ICD-10-CM

## 2019-08-30 DIAGNOSIS — Z7902 Long term (current) use of antithrombotics/antiplatelets: Secondary | ICD-10-CM | POA: Diagnosis not present

## 2019-08-30 DIAGNOSIS — I10 Essential (primary) hypertension: Secondary | ICD-10-CM

## 2019-08-30 DIAGNOSIS — J8410 Pulmonary fibrosis, unspecified: Secondary | ICD-10-CM | POA: Diagnosis present

## 2019-08-30 DIAGNOSIS — Z7951 Long term (current) use of inhaled steroids: Secondary | ICD-10-CM | POA: Diagnosis not present

## 2019-08-30 DIAGNOSIS — K2100 Gastro-esophageal reflux disease with esophagitis, without bleeding: Secondary | ICD-10-CM | POA: Diagnosis present

## 2019-08-30 DIAGNOSIS — E871 Hypo-osmolality and hyponatremia: Secondary | ICD-10-CM | POA: Diagnosis present

## 2019-08-30 DIAGNOSIS — J9621 Acute and chronic respiratory failure with hypoxia: Secondary | ICD-10-CM | POA: Diagnosis present

## 2019-08-30 DIAGNOSIS — J440 Chronic obstructive pulmonary disease with acute lower respiratory infection: Secondary | ICD-10-CM | POA: Diagnosis present

## 2019-08-30 DIAGNOSIS — Z955 Presence of coronary angioplasty implant and graft: Secondary | ICD-10-CM

## 2019-08-30 DIAGNOSIS — R000 Tachycardia, unspecified: Secondary | ICD-10-CM | POA: Diagnosis present

## 2019-08-30 DIAGNOSIS — E785 Hyperlipidemia, unspecified: Secondary | ICD-10-CM | POA: Diagnosis present

## 2019-08-30 DIAGNOSIS — Z87891 Personal history of nicotine dependence: Secondary | ICD-10-CM

## 2019-08-30 DIAGNOSIS — Z8249 Family history of ischemic heart disease and other diseases of the circulatory system: Secondary | ICD-10-CM | POA: Diagnosis not present

## 2019-08-30 DIAGNOSIS — R0600 Dyspnea, unspecified: Secondary | ICD-10-CM | POA: Diagnosis present

## 2019-08-30 DIAGNOSIS — Z20828 Contact with and (suspected) exposure to other viral communicable diseases: Secondary | ICD-10-CM | POA: Diagnosis present

## 2019-08-30 DIAGNOSIS — J441 Chronic obstructive pulmonary disease with (acute) exacerbation: Secondary | ICD-10-CM | POA: Diagnosis present

## 2019-08-30 DIAGNOSIS — Z7982 Long term (current) use of aspirin: Secondary | ICD-10-CM | POA: Diagnosis not present

## 2019-08-30 DIAGNOSIS — R0609 Other forms of dyspnea: Secondary | ICD-10-CM

## 2019-08-30 DIAGNOSIS — R1011 Right upper quadrant pain: Secondary | ICD-10-CM

## 2019-08-30 DIAGNOSIS — J189 Pneumonia, unspecified organism: Secondary | ICD-10-CM | POA: Diagnosis present

## 2019-08-30 LAB — RESPIRATORY PANEL BY PCR

## 2019-08-30 LAB — CBC WITH DIFFERENTIAL/PLATELET
Abs Immature Granulocytes: 0.05 10*3/uL (ref 0.00–0.07)
Basophils Absolute: 0 10*3/uL (ref 0.0–0.1)
Basophils Relative: 0 %
Eosinophils Absolute: 0 10*3/uL (ref 0.0–0.5)
Eosinophils Relative: 0 %
HCT: 34.6 % — ABNORMAL LOW (ref 39.0–52.0)
Hemoglobin: 11.7 g/dL — ABNORMAL LOW (ref 13.0–17.0)
Immature Granulocytes: 1 %
Lymphocytes Relative: 13 %
Lymphs Abs: 1.2 10*3/uL (ref 0.7–4.0)
MCH: 30.7 pg (ref 26.0–34.0)
MCHC: 33.8 g/dL (ref 30.0–36.0)
MCV: 90.8 fL (ref 80.0–100.0)
Monocytes Absolute: 0.8 10*3/uL (ref 0.1–1.0)
Monocytes Relative: 8 %
Neutro Abs: 7.6 10*3/uL (ref 1.7–7.7)
Neutrophils Relative %: 78 %
Platelets: 527 10*3/uL — ABNORMAL HIGH (ref 150–400)
RBC: 3.81 MIL/uL — ABNORMAL LOW (ref 4.22–5.81)
RDW: 13.5 % (ref 11.5–15.5)
WBC: 9.7 10*3/uL (ref 4.0–10.5)
nRBC: 0 % (ref 0.0–0.2)

## 2019-08-30 LAB — BASIC METABOLIC PANEL
Anion gap: 12 (ref 5–15)
BUN: 13 mg/dL (ref 8–23)
CO2: 25 mmol/L (ref 22–32)
Calcium: 9.3 mg/dL (ref 8.9–10.3)
Chloride: 90 mmol/L — ABNORMAL LOW (ref 98–111)
Creatinine, Ser: 0.91 mg/dL (ref 0.61–1.24)
GFR calc Af Amer: >60 mL/min (ref >60)
GFR calc non Af Amer: >60 mL/min (ref >60)
Glucose, Bld: 126 mg/dL — ABNORMAL HIGH (ref 70–99)
Potassium: 4 mmol/L (ref 3.5–5.1)
Sodium: 127 mmol/L — ABNORMAL LOW (ref 135–145)

## 2019-08-30 LAB — BRAIN NATRIURETIC PEPTIDE: B Natriuretic Peptide: 26 pg/mL (ref 0.0–100.0)

## 2019-08-30 LAB — TROPONIN I (HIGH SENSITIVITY)
Troponin I (High Sensitivity): 4 ng/L (ref <18)
Troponin I (High Sensitivity): 3 ng/L (ref <18)
Troponin I (High Sensitivity): 3 ng/L (ref <18)

## 2019-08-30 LAB — INFLUENZA PANEL BY PCR (TYPE A & B)
Influenza A By PCR: NEGATIVE
Influenza B By PCR: NEGATIVE

## 2019-08-30 LAB — SARS CORONAVIRUS 2 (TAT 6-24 HRS): SARS Coronavirus 2: NEGATIVE

## 2019-08-30 LAB — HIV ANTIBODY (ROUTINE TESTING W REFLEX): HIV Screen 4th Generation wRfx: NONREACTIVE

## 2019-08-30 LAB — STREP PNEUMONIAE URINARY ANTIGEN: Strep Pneumo Urinary Antigen: NEGATIVE

## 2019-08-30 LAB — PROCALCITONIN: Procalcitonin: <0.1 ng/mL

## 2019-08-30 MED ORDER — SODIUM CHLORIDE 0.9 % IV SOLN
2.0000 g | Freq: Once | INTRAVENOUS | Status: AC
  Administered 2019-08-30: 2 g via INTRAVENOUS
  Filled 2019-08-30: qty 2

## 2019-08-30 MED ORDER — ONDANSETRON HCL 4 MG PO TABS: 4.0000 mg | ORAL_TABLET | Freq: Four times a day (QID) | ORAL | Status: DC | PRN

## 2019-08-30 MED ORDER — SODIUM CHLORIDE 0.9 % IV SOLN
500.0000 mg | Freq: Once | INTRAVENOUS | Status: AC
  Administered 2019-08-30: 11:00:00 500 mg via INTRAVENOUS
  Filled 2019-08-30: qty 500

## 2019-08-30 MED ORDER — SODIUM CHLORIDE 0.9 % IV SOLN: 250.0000 mL | INTRAVENOUS | Status: DC | PRN

## 2019-08-30 MED ORDER — IPRATROPIUM BROMIDE 0.02 % IN SOLN
0.5000 mg | Freq: Four times a day (QID) | RESPIRATORY_TRACT | Status: DC
  Administered 2019-08-30 (×2): 0.5 mg via RESPIRATORY_TRACT
  Filled 2019-08-30 (×2): qty 2.5

## 2019-08-30 MED ORDER — METOPROLOL SUCCINATE ER 50 MG PO TB24
50.0000 mg | ORAL_TABLET | Freq: Every day | ORAL | Status: DC
  Administered 2019-08-31 – 2019-09-02 (×3): 50 mg via ORAL
  Filled 2019-08-30 (×3): qty 1

## 2019-08-30 MED ORDER — CALCIUM CARBONATE-VITAMIN D 500-200 MG-UNIT PO TABS
2.0000 | ORAL_TABLET | Freq: Every day | ORAL | Status: DC
  Administered 2019-09-01 – 2019-09-02 (×2): 2 via ORAL
  Filled 2019-08-30 (×2): qty 2

## 2019-08-30 MED ORDER — SODIUM CHLORIDE 0.9% FLUSH
3.0000 mL | Freq: Two times a day (BID) | INTRAVENOUS | Status: DC
  Administered 2019-08-30 – 2019-09-02 (×6): 3 mL via INTRAVENOUS

## 2019-08-30 MED ORDER — ONDANSETRON HCL 4 MG/2ML IJ SOLN: 4.0000 mg | Freq: Four times a day (QID) | INTRAMUSCULAR | Status: DC | PRN

## 2019-08-30 MED ORDER — FLUTICASONE FUROATE-VILANTEROL 200-25 MCG/INH IN AEPB
1.0000 | INHALATION_SPRAY | Freq: Every day | RESPIRATORY_TRACT | Status: DC
  Administered 2019-08-31 – 2019-09-02 (×3): 1 via RESPIRATORY_TRACT
  Filled 2019-08-30: qty 28

## 2019-08-30 MED ORDER — GUAIFENESIN ER 600 MG PO TB12
600.0000 mg | ORAL_TABLET | Freq: Two times a day (BID) | ORAL | Status: DC
  Administered 2019-08-30 – 2019-09-02 (×5): 600 mg via ORAL
  Filled 2019-08-30 (×5): qty 1

## 2019-08-30 MED ORDER — SODIUM CHLORIDE 0.9% FLUSH: 3.0000 mL | INTRAVENOUS | Status: DC | PRN

## 2019-08-30 MED ORDER — TIOTROPIUM BROMIDE MONOHYDRATE 18 MCG IN CAPS
18.0000 ug | ORAL_CAPSULE | Freq: Every day | RESPIRATORY_TRACT | Status: DC
  Administered 2019-08-31 – 2019-09-02 (×3): 18 ug via RESPIRATORY_TRACT
  Filled 2019-08-30: qty 5

## 2019-08-30 MED ORDER — MAGNESIUM OXIDE 400 (241.3 MG) MG PO TABS
400.0000 mg | ORAL_TABLET | Freq: Every day | ORAL | Status: DC
  Administered 2019-08-30 – 2019-09-02 (×3): 400 mg via ORAL
  Filled 2019-08-30 (×3): qty 1

## 2019-08-30 MED ORDER — ASPIRIN EC 81 MG PO TBEC
81.0000 mg | DELAYED_RELEASE_TABLET | Freq: Every day | ORAL | Status: DC
  Administered 2019-09-01 – 2019-09-02 (×2): 81 mg via ORAL
  Filled 2019-08-30 (×2): qty 1

## 2019-08-30 MED ORDER — LORATADINE 10 MG PO TABS
10.0000 mg | ORAL_TABLET | Freq: Every day | ORAL | Status: DC
  Administered 2019-09-01 – 2019-09-02 (×2): 10 mg via ORAL
  Filled 2019-08-30 (×2): qty 1

## 2019-08-30 MED ORDER — ATORVASTATIN CALCIUM 20 MG PO TABS
40.0000 mg | ORAL_TABLET | Freq: Every day | ORAL | Status: DC
  Administered 2019-08-30 – 2019-09-01 (×3): 40 mg via ORAL
  Filled 2019-08-30 (×3): qty 2

## 2019-08-30 MED ORDER — ACETAMINOPHEN 325 MG PO TABS
650.0000 mg | ORAL_TABLET | Freq: Four times a day (QID) | ORAL | Status: DC | PRN
  Administered 2019-08-31 – 2019-09-02 (×3): 650 mg via ORAL
  Filled 2019-08-30 (×3): qty 2

## 2019-08-30 MED ORDER — PANTOPRAZOLE SODIUM 40 MG PO TBEC
40.0000 mg | DELAYED_RELEASE_TABLET | Freq: Two times a day (BID) | ORAL | Status: DC
  Administered 2019-08-30 – 2019-09-02 (×5): 40 mg via ORAL
  Filled 2019-08-30 (×6): qty 1

## 2019-08-30 MED ORDER — ADULT MULTIVITAMIN W/MINERALS CH
1.0000 | ORAL_TABLET | Freq: Every day | ORAL | Status: DC
  Administered 2019-09-01 – 2019-09-02 (×2): 1 via ORAL
  Filled 2019-08-30 (×2): qty 1

## 2019-08-30 MED ORDER — POLYETHYLENE GLYCOL 3350 17 G PO PACK: 17.0000 g | PACK | Freq: Every day | ORAL | Status: DC | PRN

## 2019-08-30 MED ORDER — ALBUTEROL SULFATE (2.5 MG/3ML) 0.083% IN NEBU
2.5000 mg | INHALATION_SOLUTION | Freq: Four times a day (QID) | RESPIRATORY_TRACT | Status: DC
  Administered 2019-08-30 (×2): 2.5 mg via RESPIRATORY_TRACT
  Filled 2019-08-30 (×2): qty 3

## 2019-08-30 MED ORDER — CLOPIDOGREL BISULFATE 75 MG PO TABS
75.0000 mg | ORAL_TABLET | Freq: Every day | ORAL | Status: DC
  Administered 2019-08-31 – 2019-09-02 (×3): 75 mg via ORAL
  Filled 2019-08-30 (×3): qty 1

## 2019-08-30 MED ORDER — METHYLPREDNISOLONE SODIUM SUCC 40 MG IJ SOLR
40.0000 mg | Freq: Two times a day (BID) | INTRAMUSCULAR | Status: DC
  Administered 2019-08-30 – 2019-09-02 (×6): 40 mg via INTRAVENOUS
  Filled 2019-08-30 (×6): qty 1

## 2019-08-30 MED ORDER — ENOXAPARIN SODIUM 40 MG/0.4ML ~~LOC~~ SOLN
40.0000 mg | SUBCUTANEOUS | Status: DC
  Administered 2019-08-30 – 2019-09-01 (×3): 40 mg via SUBCUTANEOUS
  Filled 2019-08-30 (×4): qty 0.4

## 2019-08-30 MED ORDER — SODIUM CHLORIDE 0.9 % IV SOLN
2.0000 g | Freq: Three times a day (TID) | INTRAVENOUS | Status: DC
  Administered 2019-08-30 – 2019-09-01 (×5): 2 g via INTRAVENOUS
  Filled 2019-08-30 (×7): qty 2

## 2019-08-30 MED ORDER — AMLODIPINE BESYLATE 10 MG PO TABS
10.0000 mg | ORAL_TABLET | Freq: Every day | ORAL | Status: DC
  Administered 2019-08-31 – 2019-09-02 (×3): 10 mg via ORAL
  Filled 2019-08-30 (×3): qty 1

## 2019-08-30 MED ORDER — DOCUSATE SODIUM 100 MG PO CAPS
100.0000 mg | ORAL_CAPSULE | Freq: Two times a day (BID) | ORAL | Status: DC
  Filled 2019-08-30 (×4): qty 1

## 2019-08-30 MED ORDER — PREDNISONE 20 MG PO TABS
60.0000 mg | ORAL_TABLET | Freq: Once | ORAL | Status: AC
  Administered 2019-08-30: 60 mg via ORAL
  Filled 2019-08-30: qty 3

## 2019-08-30 MED ORDER — IPRATROPIUM-ALBUTEROL 0.5-2.5 (3) MG/3ML IN SOLN
3.0000 mL | Freq: Four times a day (QID) | RESPIRATORY_TRACT | Status: DC
  Administered 2019-08-31 – 2019-09-02 (×10): 3 mL via RESPIRATORY_TRACT
  Filled 2019-08-30 (×10): qty 3

## 2019-08-30 MED ORDER — SODIUM CHLORIDE 0.9 % IV SOLN
500.0000 mg | INTRAVENOUS | Status: DC
  Administered 2019-08-31 – 2019-09-01 (×2): 500 mg via INTRAVENOUS
  Filled 2019-08-30 (×2): qty 500

## 2019-08-30 MED ORDER — POTASSIUM CHLORIDE CRYS ER 20 MEQ PO TBCR
20.0000 meq | EXTENDED_RELEASE_TABLET | Freq: Every day | ORAL | Status: DC
  Administered 2019-08-30 – 2019-09-02 (×4): 20 meq via ORAL
  Filled 2019-08-30 (×3): qty 1

## 2019-08-30 MED ORDER — ALBUTEROL SULFATE HFA 108 (90 BASE) MCG/ACT IN AERS
4.0000 | INHALATION_SPRAY | Freq: Once | RESPIRATORY_TRACT | Status: AC
  Administered 2019-08-30: 11:00:00 4 via RESPIRATORY_TRACT
  Filled 2019-08-30: qty 6.7

## 2019-08-30 MED ORDER — ACETAMINOPHEN 650 MG RE SUPP: 650.0000 mg | Freq: Four times a day (QID) | RECTAL | Status: DC | PRN

## 2019-08-30 NOTE — Consult Note (Signed)
Pharmacy Antibiotic Note  Austin Patterson is a 63 y.o. male with past medical history of COPD and pulmonary fibrosis on chronic supplemental oxygen admitted on 08/30/2019 with pneumonia. Patient reports worsening shortness of breath, cough, and difficulty breathing over the last 3-4 days. Reports recent visit to PCP and antibiotic course which did not improve symptoms. Pharmacy has been consulted for cefepime dosing. Patient also ordered azithromycin.   PCT <0.10, WBC 9.7 without left shift, afebrile. CXR with interval increase in diffuse interstitial opacity.   Plan: Cefepime 2 g q8h  Height: 5\' 7"  (170.2 cm) Weight: 209 lb (94.8 kg) IBW/kg (Calculated) : 66.1  Temp (24hrs), Avg:98.5 F (36.9 C), Min:98.2 F (36.8 C), Max:98.8 F (37.1 C)  Recent Labs  Lab 08/30/19 0910  WBC 9.7  CREATININE 0.91    Estimated Creatinine Clearance: 91.2 mL/min (by C-G formula based on SCr of 0.91 mg/dL).    No Known Allergies  Antimicrobials this admission: Azithromycin 11/11 >>  Cefepime 11/11 >>  Microbiology results: 11/11 BCx: pending 11/11 Respiratory panel: pending 11/11 COVID-19: negative  Thank you for allowing pharmacy to be a part of this patient's care.  [place] Resident 08/30/2019 6:47 PM

## 2019-08-30 NOTE — ED Provider Notes (Signed)
[facility] Emergency Department Provider Note   ____________________________________________   First MD Initiated Contact with Patient 08/30/19 [phone]     (approximate)  I have reviewed the triage vital signs and the nursing notes.   HISTORY  Chief Complaint Shortness of Breath    HPI Austin Patterson is a 63 y.o. male with past medical history of COPD and pulmonary fibrosis on 2 L nasal cannula presents to the ED complaining of shortness of breath.  Patient reports he has had approximately 4 days of gradually worsening dyspnea on exertion.  This has been associated with chest pressure and a cough productive of clear sputum.  He denies any fevers and has not noticed any pain or swelling in his legs.  He has been using inhaled albuterol at home without significant relief.        Past Medical History:  Diagnosis Date  . COPD (chronic obstructive pulmonary disease) ([place])   . Hyperlipidemia   . Hypertension   . Pulmonary fibrosis ([place]) 11/2015    Patient Active Problem List   Diagnosis Date Noted  . Hypomagnesemia 03/31/2017  . Hiatal hernia   . Reflux esophagitis   . Hematemesis without nausea   . Hyponatremia 09/21/2016  . Pneumonia 12/09/2015  . COPD with acute exacerbation ([place]) 10/14/2015    Past Surgical History:  Procedure Laterality Date  . CORONARY STENT PLACEMENT    . ESOPHAGOGASTRODUODENOSCOPY (EGD) WITH PROPOFOL N/A 09/23/2016   Procedure: ESOPHAGOGASTRODUODENOSCOPY (EGD) WITH PROPOFOL;  Surgeon: [name], MD;  Location: [facility] ENDOSCOPY;  Service: Endoscopy;  Laterality: N/A;  . SHOULDER ACROMIOPLASTY      Prior to Admission medications   Medication Sig Start Date End Date Taking? Authorizing Provider  albuterol (PROVENTIL HFA;VENTOLIN HFA) 108 (90 Base) MCG/ACT inhaler Inhale 2 puffs into the lungs every 6 (six) hours as needed for wheezing or shortness of breath. 01/29/17   Darel Hong, MD  amLODipine (NORVASC) 10 MG tablet  Take 1 tablet (10 mg total) by mouth daily. 08/19/16   Tawni Millers, MD  aspirin EC 81 MG tablet Take 1 tablet (81 mg total) by mouth daily. 08/19/16   Tawni Millers, MD  atorvastatin (LIPITOR) 40 MG tablet Take 1 tablet (40 mg total) by mouth at bedtime. 08/19/16   Tawni Millers, MD  calcium-vitamin D (OSCAL WITH D) 500-200 MG-UNIT tablet Take 2 tablets by mouth daily with breakfast. 09/24/16   Fritzi Mandes, MD  cetirizine (ZYRTEC) 10 MG tablet Take 10 mg by mouth at bedtime. 08/19/16   [provider]  clopidogrel (PLAVIX) 75 MG tablet Take 1 tablet (75 mg total) by mouth daily. 08/19/16   Tawni Millers, MD  doxycycline (VIBRAMYCIN) 100 MG capsule Take 1 capsule (100 mg total) by mouth 2 (two) times daily. 06/14/18   Nena Polio, MD  Fluticasone-Salmeterol (ADVAIR DISKUS) 250-50 MCG/DOSE AEPB Inhale 1 puff into the lungs 2 (two) times daily. 03/07/19 03/06/20  Laverle Hobby, MD  losartan-hydrochlorothiazide (HYZAAR) 100-12.5 MG tablet Take 1 tablet by mouth daily. 08/19/16   Tawni Millers, MD  magnesium oxide (MAG-OX) 400 (241.3 Mg) MG tablet Take 1 tablet (400 mg total) by mouth daily. 06/18/17   Hillary Bow, MD  metoprolol succinate (TOPROL-XL) 50 MG 24 hr tablet Take 1 tablet (50 mg total) by mouth daily. 08/19/16   Tawni Millers, MD  Multiple Vitamin (MULTIVITAMIN WITH MINERALS) TABS tablet Take 1 tablet by mouth daily. 09/24/16   Fritzi Mandes, MD  pantoprazole (  PROTONIX) 40 MG tablet Take 1 tablet (40 mg total) by mouth 2 (two) times daily. 09/23/16   Fritzi Mandes, MD  potassium chloride 20 MEQ TBCR Take 20 mEq by mouth daily. 06/18/17   Hillary Bow, MD  Spacer/Aero Chamber Mouthpiece MISC 1 each by Does not apply route every 4 (four) hours as needed (wheezing). 01/29/17   Darel Hong, MD  tiotropium (SPIRIVA HANDIHALER) 18 MCG inhalation capsule Place 1 capsule (18 mcg total) into inhaler and inhale daily. 03/07/19 03/06/20  Laverle Hobby, MD  tiotropium (SPIRIVA  HANDIHALER) 18 MCG inhalation capsule Place 1 capsule (18 mcg total) into inhaler and inhale daily. 04/26/19 04/25/20  Laverle Hobby, MD    Allergies Patient has no known allergies.  Family History  Problem Relation Age of Onset  . Heart disease Mother     Social History Social History   Tobacco Use  . Smoking status: Former Smoker    Quit date: 10/14/2015    Years since quitting: 3.8  . Smokeless tobacco: Former Network engineer Use Topics  . Alcohol use: Yes    Alcohol/week: 28.0 standard drinks    Types: 28 Cans of beer per week    Comment: 6 12 oz cans of beer/day  . Drug use: No    Review of Systems  Constitutional: No fever/chills Eyes: No visual changes. ENT: No sore throat. Cardiovascular: Positive for chest pain. Respiratory: Positive for shortness of breath. Gastrointestinal: No abdominal pain.  No nausea, no vomiting.  No diarrhea.  No constipation. Genitourinary: Negative for dysuria. Musculoskeletal: Negative for back pain. Skin: Negative for rash. Neurological: Negative for headaches, focal weakness or numbness.  ____________________________________________   PHYSICAL EXAM:  VITAL SIGNS: ED Triage Vitals  Enc Vitals Group     BP      Pulse      Resp      Temp      Temp src      SpO2      Weight      Height      Head Circumference      Peak Flow      Pain Score      Pain Loc      Pain Edu?      Excl. in [place]?     Constitutional: Alert and oriented. Eyes: Conjunctivae are normal. Head: Atraumatic. Nose: No congestion/rhinnorhea. Mouth/Throat: Mucous membranes are moist. Neck: Normal ROM Cardiovascular: Normal rate, regular rhythm. Grossly normal heart sounds. Respiratory: Normal respiratory effort.  No retractions.  Expiratory wheezing throughout. Gastrointestinal: Soft and nontender. No distention. Genitourinary: deferred Musculoskeletal: No lower extremity tenderness nor edema. Neurologic:  Normal speech and language. No gross  focal neurologic deficits are appreciated. Skin:  Skin is warm, dry and intact. No rash noted. Psychiatric: Mood and affect are normal. Speech and behavior are normal.  ____________________________________________   LABS (all labs ordered are listed, but only abnormal results are displayed)  Labs Reviewed  CBC WITH DIFFERENTIAL/PLATELET - Abnormal; Notable for the following components:      Result Value   RBC 3.81 (*)    Hemoglobin 11.7 (*)    HCT 34.6 (*)    Platelets 527 (*)    All other components within normal limits  BASIC METABOLIC PANEL - Abnormal; Notable for the following components:   Sodium 127 (*)    Chloride 90 (*)    Glucose, Bld 126 (*)    All other components within normal limits  SARS CORONAVIRUS 2 (TAT 6-24 HRS)  BRAIN NATRIURETIC PEPTIDE  TROPONIN I (HIGH SENSITIVITY)   ____________________________________________  EKG  ED ECG REPORT I, Blake Divine, the attending physician, personally viewed and interpreted this ECG.   Date: 08/30/2019  EKG Time: 8:57  Rate: 88  Rhythm: normal sinus rhythm  Axis: Normal  Intervals:none  ST&T Change: None   PROCEDURES  Procedure(s) performed (including Critical Care):  Procedures   ____________________________________________   INITIAL IMPRESSION / ASSESSMENT AND PLAN / ED COURSE       63 year old male with history of idiopathic pulmonary fibrosis and COPD on 2 L nasal cannula chronically presents to the ED for increasing shortness of breath over the past 2 days which has been associated with a feeling of pressure in his chest.  He denies any fevers and vital signs are reassuring, doubt sepsis.  Will check chest x-ray for pneumonia, patient does report recently finishing a course of antibiotics.  His symptoms seem most likely related to COPD given wheezing on exam and also suspect this is the source of the feeling of tightness and pressure in his chest.  Will treat with albuterol and steroids, plan to  reevaluate.  EKG shows no acute ischemic changes, will screen 2 sets of troponin but lower suspicion for cardiac etiology.  Do not suspect PE.  Chest x-ray shows worsening opacities from imaging obtained at the end of October.  Given his worsening productive cough, this is concerning for pneumonia.  Lower suspicion for CHF as his BNP is within normal limits and he does not clinically appear fluid overloaded.  We will treat with cefepime and azithromycin, coverage for Pseudomonas given his structural lung disease.  He was ambulated on his usual 2 L nasal cannula and quickly became short of breath with desat to mid 80s.  Case discussed with hospitalist, who accepts patient for admission.      ____________________________________________   FINAL CLINICAL IMPRESSION(S) / ED DIAGNOSES  Final diagnoses:  Community acquired pneumonia, unspecified laterality  IPF (idiopathic pulmonary fibrosis) ([place])  Dyspnea on exertion     ED Discharge Orders    None       Note:  This document was prepared using Dragon voice recognition software and may include unintentional dictation errors.   Blake Divine, MD 08/30/19 [phone]

## 2019-08-30 NOTE — ED Notes (Signed)
Ambulated pt on his normal 2L o2 [place] and pt sat level dropped to 85.

## 2019-08-30 NOTE — ED Notes (Signed)
Admitting provider at bedside.

## 2019-08-30 NOTE — ED Notes (Signed)
Provided pt with food tray.

## 2019-08-30 NOTE — ED Notes (Addendum)
Pt presents w/ worsening SOB, pt has hx of COPD and pulmonary fibrosis. Pt stated his SOB has worsened since taking antibiotics which he finished last Friday. Pt denies fever and N/V/D.

## 2019-08-30 NOTE — ED Triage Notes (Signed)
Pt arrived from home via [facility] c/o SOB. Pt has hx of COPD and wears O2 2L [place] at home. Pt has been experiencing chest pain x 4 days which pt states feels like pressure.

## 2019-08-30 NOTE — ED Notes (Signed)
TV turned on for pt. Urinal at bedside. Waiting on admit bed.

## 2019-08-30 NOTE — H&P (Signed)
[facility] History and Physical  Austin Patterson [id number] DOB: [birth year] DOA: 08/30/2019  Referring physician: ED  PCP: Lavonne Chick, MD   Chief Complaint: Dyspnea  HPI: Austin Patterson is a 63 y.o. male with past medical history of COPD, hypertension, hyperlipidemia, pulmonary fibrosis on 2 L of oxygen by nasal cannula at home, history of CAD status post stent presented to the hospital with worsening shortness of breath, cough and difficulty breathing for the last 3 to 4 days.  Patient stated that he had been feeling some congestion over the last few days.  Patient had gone to his primary care physician and was prescribed oral antibiotics but he did not feel better.  Patient states that his dyspnea is worse on exertion.  He complains of mild chest discomfort and cough which is clear but he has noticed some streaks of hemoptysis.  Patient denies dizziness, lightheadedness, syncope.  Denies any nausea, vomiting or diarrhea.  Denies urinary urgency,  dysuria or frequency.  Denies fever chills or recent sick contacts.  Denies exposure to Covid.  Patient states that he follows up with [facility].  ED Course: In the ED, patient received albuterol puffs, cefepime and azithromycin, prednisone and was considered for admission to the hospital for pneumonia acute on chronic hypoxic respiratory failure.  In the ED. patient patient desaturated to 80s oxygen saturation, on ambulation on 2 L of nasal cannula.  X-ray shows a increasing opacity in the right and left lung with the possibility of pneumonia.  Review of Systems:  All systems were reviewed and were negative unless otherwise mentioned in the HPI  Past Medical History:  Diagnosis Date   COPD (chronic obstructive pulmonary disease) ([place])    Hyperlipidemia    Hypertension    Pulmonary fibrosis ([place]) 11/2015   Past Surgical History:  Procedure Laterality Date   CORONARY STENT PLACEMENT      ESOPHAGOGASTRODUODENOSCOPY (EGD) WITH PROPOFOL N/A 09/23/2016   Procedure: ESOPHAGOGASTRODUODENOSCOPY (EGD) WITH PROPOFOL;  Surgeon: [name], MD;  Location: [facility] ENDOSCOPY;  Service: Endoscopy;  Laterality: N/A;   SHOULDER ACROMIOPLASTY      Social History:  reports that he quit smoking about 3 years ago. He has quit using smokeless tobacco. He reports current alcohol use of about 28.0 standard drinks of alcohol per week. He reports that he does not use drugs.  No Known Allergies  Family History  Problem Relation Age of Onset   Heart disease Mother      Prior to Admission medications   Medication Sig Start Date End Date Taking? Authorizing Provider  albuterol (PROVENTIL HFA;VENTOLIN HFA) 108 (90 Base) MCG/ACT inhaler Inhale 2 puffs into the lungs every 6 (six) hours as needed for wheezing or shortness of breath. 01/29/17   Darel Hong, MD  amLODipine (NORVASC) 10 MG tablet Take 1 tablet (10 mg total) by mouth daily. 08/19/16   Tawni Millers, MD  aspirin EC 81 MG tablet Take 1 tablet (81 mg total) by mouth daily. 08/19/16   Tawni Millers, MD  atorvastatin (LIPITOR) 40 MG tablet Take 1 tablet (40 mg total) by mouth at bedtime. 08/19/16   Tawni Millers, MD  calcium-vitamin D (OSCAL WITH D) 500-200 MG-UNIT tablet Take 2 tablets by mouth daily with breakfast. 09/24/16   Fritzi Mandes, MD  cetirizine (ZYRTEC) 10 MG tablet Take 10 mg by mouth at bedtime. 08/19/16   [provider]  clopidogrel (PLAVIX) 75 MG tablet Take 1 tablet (75 mg total) by mouth daily.  08/19/16   Tawni Millers, MD  doxycycline (VIBRAMYCIN) 100 MG capsule Take 1 capsule (100 mg total) by mouth 2 (two) times daily. 06/14/18   Nena Polio, MD  Fluticasone-Salmeterol (ADVAIR DISKUS) 250-50 MCG/DOSE AEPB Inhale 1 puff into the lungs 2 (two) times daily. 03/07/19 03/06/20  Laverle Hobby, MD  losartan-hydrochlorothiazide (HYZAAR) 100-12.5 MG tablet Take 1 tablet by mouth daily. 08/19/16   Tawni Millers, MD    magnesium oxide (MAG-OX) 400 (241.3 Mg) MG tablet Take 1 tablet (400 mg total) by mouth daily. 06/18/17   Hillary Bow, MD  metoprolol succinate (TOPROL-XL) 50 MG 24 hr tablet Take 1 tablet (50 mg total) by mouth daily. 08/19/16   Tawni Millers, MD  Multiple Vitamin (MULTIVITAMIN WITH MINERALS) TABS tablet Take 1 tablet by mouth daily. 09/24/16   Fritzi Mandes, MD  pantoprazole (PROTONIX) 40 MG tablet Take 1 tablet (40 mg total) by mouth 2 (two) times daily. 09/23/16   Fritzi Mandes, MD  potassium chloride 20 MEQ TBCR Take 20 mEq by mouth daily. 06/18/17   Hillary Bow, MD  Spacer/Aero Chamber Mouthpiece MISC 1 each by Does not apply route every 4 (four) hours as needed (wheezing). 01/29/17   Darel Hong, MD  tiotropium (SPIRIVA HANDIHALER) 18 MCG inhalation capsule Place 1 capsule (18 mcg total) into inhaler and inhale daily. 03/07/19 03/06/20  Laverle Hobby, MD  tiotropium (SPIRIVA HANDIHALER) 18 MCG inhalation capsule Place 1 capsule (18 mcg total) into inhaler and inhale daily. 04/26/19 04/25/20  Laverle Hobby, MD    Physical Exam: Patient examined with full isolation precautions. Vitals:   08/30/19 0903 08/30/19 1000 08/30/19 1022 08/30/19 1030  BP:  134/67  (!) 154/83  Pulse:  77 100 78  Resp:  (!) 21 (!) 24 17  Temp:      TempSrc:      SpO2:  96% (!) 82% 97%  Weight: 94.8 kg     Height: 5\' 7"  (1.702 m)      Wt Readings from Last 3 Encounters:  08/30/19 94.8 kg  04/26/19 91 kg  11/03/18 92.1 kg   Body mass index is 32.73 kg/m.  General:  Average built, not in obvious distress, obese.  On nasal cannula oxygen. HENT: Normocephalic, pupils equally reacting to light and accommodation.  No scleral pallor or icterus noted. Oral mucosa is moist.  Chest:    Diminished breath sounds bilaterally.  Mild crackles noted.  Expiratory rhonchi. CVS: S1 &S2 heard. No murmur.  Regular rate and rhythm. Abdomen: Soft, nontender, nondistended.  Bowel sounds are heard.  Liver is not  palpable, no abdominal mass palpated Extremities: No cyanosis, clubbing or edema.  Peripheral pulses are palpable. Psych: Alert, awake and oriented, normal mood CNS:  No cranial nerve deficits.  Power equal in all extremities.   No cerebellar signs.   Skin: Warm and dry.  No rashes noted.  Labs on Admission:   CBC: Recent Labs  Lab 08/30/19 0910  WBC 9.7  NEUTROABS 7.6  HGB 11.7*  HCT 34.6*  MCV 90.8  PLT 527*    Basic Metabolic Panel: Recent Labs  Lab 08/30/19 0910  NA 127*  K 4.0  CL 90*  CO2 25  GLUCOSE 126*  BUN 13  CREATININE 0.91  CALCIUM 9.3    Liver Function Tests: No results for input(s): AST, ALT, ALKPHOS, BILITOT, PROT, ALBUMIN in the last 168 hours. No results for input(s): LIPASE, AMYLASE in the last 168 hours. No results for input(s): AMMONIA in the  last 168 hours.  Cardiac Enzymes: No results for input(s): CKTOTAL, CKMB, CKMBINDEX, TROPONINI in the last 168 hours.  BNP (last 3 results) Recent Labs    08/30/19 0910  BNP 26.0    ProBNP (last 3 results) No results for input(s): PROBNP in the last 8760 hours.  CBG: No results for input(s): GLUCAP in the last 168 hours.  Lipase     Component Value Date/Time   LIPASE 43 12/24/2017 1446     Urinalysis    Component Value Date/Time   COLORURINE YELLOW (A) 12/24/2017 1750   APPEARANCEUR HAZY (A) 12/24/2017 1750   LABSPEC 1.017 12/24/2017 1750   PHURINE 5.0 12/24/2017 1750   GLUCOSEU NEGATIVE 12/24/2017 1750   HGBUR NEGATIVE 12/24/2017 1750   BILIRUBINUR NEGATIVE 12/24/2017 1750   KETONESUR NEGATIVE 12/24/2017 1750   PROTEINUR 30 (A) 12/24/2017 1750   NITRITE NEGATIVE 12/24/2017 1750   LEUKOCYTESUR NEGATIVE 12/24/2017 1750     Drugs of Abuse  No results found for: LABOPIA, COCAINSCRNUR, LABBENZ, AMPHETMU, THCU, LABBARB    Radiological Exams on Admission: Dg Chest Portable 1 View  Result Date: 08/30/2019 CLINICAL DATA:  Shortness of breath, COPD, chest pain, history of pulmonary  fibrosis EXAM: PORTABLE CHEST 1 VIEW COMPARISON:  08/10/2019 FINDINGS: The heart size and mediastinal contours are within normal limits. Slight interval increase in diffuse interstitial opacity, most conspicuous in the right lung base and left upper lobe. Redemonstrated underlying fibrotic scarring and emphysema. The visualized skeletal structures are unremarkable. IMPRESSION: Slight interval increase in diffuse interstitial opacity, most conspicuous in the right lung base and left upper lobe, concerning for infection or edema. Redemonstrated underlying fibrotic scarring and emphysema. Electronically Signed   By: Eddie Candle M.D.   On: 08/30/2019 09:16    EKG: Personally reviewed by me which shows normal sinus rhythm  Assessment/Plan  Principal Problem:   Pneumonia Active Problems:   Hyponatremia   Reflux esophagitis   IPF (idiopathic pulmonary fibrosis) ([facility])  Possible multifocal pneumonia.  Failure of outpatient antibiotic treatment.  Chest x-ray shows increased opacity in the right base and left upper lung the background of interstitial fibrosis and COPD..  We will put the patient on broad-spectrum antibiotic due to history of COPD and pulmonary fibrosis with flare.  Received cefepime and Zithromax in the ED.  We will continue that for now..  No fever or elevated WBC.  COVID-19 pending.  Check procalcitonin.  Check influenza panel, respiratory viral panel, urine for Legionella and strep.  Follow isolation precautions.  Will obtain blood cultures.  COPD with mild exacerbation.  Patient is to be a former smoker.  Continue on nebulizer, IV Solu-Medrol.  Continue antibiotics.  History of pulmonary fibrosis with mild acute chronic hypoxic respiratory failure -on 2 L of nasal cannula at home.  Patient was hypoxic in the 80s during ambulation.  Patient follows up with [facility] as outpatient.  Will continue on oxygen, nebulizers IV Solu-Medrol and broad-spectrum antibiotic at this  time.  Coronary artery disease status post stent.  Trend troponins.  Continue aspirin, Plavix statins.  BNP of 26.  EKG was unremarkable.  Likely chest discomfort from wheezing.  Essential hypertension.  On amlodipine, metoprolol, losartan HCTZ at home will continue with all except HCTZ.  Will monitor blood pressure.  Mild hyponatremia.  HCTZ at home.  Will hold for now.  Check BMP in a.m.  Hyperlipidemia.  Continue statins  Debility.  Will get PT evaluation.  GERD.  Continue Protonix  Consultant: None  Code Status:  Full code  DVT Prophylaxis: Lovenox  Antibiotics: Cefepime and Zithromax  Family Communication:  Patients' condition and plan of care including tests being ordered have been discussed with the patient who indicate understanding and agree with the plan.  Disposition Plan: Home in 2 to 3 days  Severity of Illness: The appropriate patient status for this patient is INPATIENT. Inpatient status is judged to be reasonable and necessary in order to provide the required intensity of service to ensure the patient's safety. The patient's presenting symptoms, physical exam findings, and initial radiographic and laboratory data in the context of their chronic comorbidities is felt to place them at high risk for further clinical deterioration. Furthermore, it is not anticipated that the patient will be medically stable for discharge from the hospital within 2 midnights of admission. The following factors support the patient status of inpatient. I certify that at the point of admission it is my clinical judgment that the patient will require inpatient hospital care spanning beyond 2 midnights from the point of admission due to high intensity of service, high risk for further deterioration and high frequency of surveillance required.   Signed, Flora Lipps, MD [facility] 08/30/2019

## 2019-08-30 NOTE — ED Notes (Signed)
Meal tray ordered for pt  

## 2019-08-30 NOTE — ED Notes (Signed)
Pt resting, watching TV. Ate lunch tray. No needs at this time. Remains to wait on admit bed.

## 2019-08-31 ENCOUNTER — Inpatient Hospital Stay: Payer: Medicare HMO

## 2019-08-31 DIAGNOSIS — I10 Essential (primary) hypertension: Secondary | ICD-10-CM

## 2019-08-31 DIAGNOSIS — J441 Chronic obstructive pulmonary disease with (acute) exacerbation: Secondary | ICD-10-CM

## 2019-08-31 DIAGNOSIS — R1011 Right upper quadrant pain: Secondary | ICD-10-CM

## 2019-08-31 LAB — CBC
HCT: 31.7 % — ABNORMAL LOW (ref 39.0–52.0)
Hemoglobin: 10.6 g/dL — ABNORMAL LOW (ref 13.0–17.0)
MCH: 30.6 pg (ref 26.0–34.0)
MCHC: 33.4 g/dL (ref 30.0–36.0)
MCV: 91.6 fL (ref 80.0–100.0)
Platelets: 565 10*3/uL — ABNORMAL HIGH (ref 150–400)
RBC: 3.46 MIL/uL — ABNORMAL LOW (ref 4.22–5.81)
RDW: 13.8 % (ref 11.5–15.5)
WBC: 14.1 10*3/uL — ABNORMAL HIGH (ref 4.0–10.5)
nRBC: 0 % (ref 0.0–0.2)

## 2019-08-31 LAB — COMPREHENSIVE METABOLIC PANEL
ALT: 23 U/L (ref 0–44)
AST: 25 U/L (ref 15–41)
Albumin: 4.1 g/dL (ref 3.5–5.0)
Alkaline Phosphatase: 54 U/L (ref 38–126)
Anion gap: 10 (ref 5–15)
BUN: 16 mg/dL (ref 8–23)
CO2: 24 mmol/L (ref 22–32)
Calcium: 9.3 mg/dL (ref 8.9–10.3)
Chloride: 97 mmol/L — ABNORMAL LOW (ref 98–111)
Creatinine, Ser: 0.74 mg/dL (ref 0.61–1.24)
GFR calc Af Amer: >60 mL/min (ref >60)
GFR calc non Af Amer: >60 mL/min (ref >60)
Glucose, Bld: 161 mg/dL — ABNORMAL HIGH (ref 70–99)
Potassium: 4 mmol/L (ref 3.5–5.1)
Sodium: 131 mmol/L — ABNORMAL LOW (ref 135–145)
Total Bilirubin: 0.6 mg/dL (ref 0.3–1.2)
Total Protein: 7.1 g/dL (ref 6.5–8.1)

## 2019-08-31 MED ORDER — TRAZODONE HCL 50 MG PO TABS
50.0000 mg | ORAL_TABLET | Freq: Every evening | ORAL | Status: DC | PRN
  Administered 2019-08-31 – 2019-09-01 (×2): 50 mg via ORAL
  Filled 2019-08-31 (×2): qty 1

## 2019-08-31 MED ORDER — GUAIFENESIN-DM 100-10 MG/5ML PO SYRP
5.0000 mL | ORAL_SOLUTION | ORAL | Status: DC | PRN
  Administered 2019-08-31: 5 mL via ORAL
  Filled 2019-08-31: qty 5

## 2019-08-31 MED ORDER — GUAIFENESIN 100 MG/5ML PO SOLN
5.0000 mL | ORAL | Status: DC | PRN
  Filled 2019-08-31: qty 5

## 2019-08-31 NOTE — Evaluation (Signed)
Physical Therapy Evaluation Patient Details Name: Austin Patterson MRN: [id number] DOB: [birth year] Today's Date: 08/31/2019   History of Present Illness   63 y.o. male with past medical history of COPD, hypertension, hyperlipidemia, pulmonary fibrosis on 2 L of oxygen by nasal cannula at home, history of CAD status post stent presented to the hospital with worsening shortness of breath, cough and difficulty breathing for the few days before coming and being diagnosed with pneumonia.  Clinical Impression  Generally speaking pt did well with most aspects of PT (functional balance, strength, gait, etc) but had shortness of breath t/o the effort.  His O2 was 88-91% at rest on 2L, and with initial ambulation (on 3L) but after being on his feet a while and performing steps he was clearly becoming very short of breath/fatigued and on return to bed his O2 was at 80% and his HR was in the 140s.  When pt is medically clear he should be able to manage at home, but currently with cardiovascular health is compromising his ability to return home.     Follow Up Recommendations Home health PT;No PT follow up(per progress; likely will not need PT)    Equipment Recommendations  None recommended by PT    Recommendations for Other Services       Precautions / Restrictions Precautions Precautions: None Restrictions Weight Bearing Restrictions: No      Mobility  Bed Mobility Overal bed mobility: Independent             General bed mobility comments: easily gets to sitting EOB w/o assist  Transfers Overall transfer level: Modified independent Equipment used: Rolling walker (2 wheeled);None             General transfer comment: Pt able to rise to standing and maintain balance w/o issue  Ambulation/Gait Ambulation/Gait assistance: Modified independent (Device/Increase time) Gait Distance (Feet): 85 Feet Assistive device: None       General Gait Details: Pt was able to ambulate w/o AD  slowly but safely on 3L, O2 starting off ~90 and was down to 80% on return to room with DOE and pt was clearly very fatigued with the effort.  Stairs Stairs: Yes Stairs assistance: Supervision Stair Management: One rail Right Number of Stairs: 6 General stair comments: Pt was able to negotiate up/down steps w/o assist or safety issues.  Pt was, however, very fatigued with the effort.  Wheelchair Mobility    Modified Rankin (Stroke Patients Only)       Balance Overall balance assessment: Modified Independent                                           Pertinent Vitals/Pain Pain Assessment: No/denies pain    Home Living Family/patient expects to be discharged to:: Private residence Living Arrangements: Alone Available Help at Discharge: (Reports he could have some minimal inconsistent help) Type of Home: House Home Access: Stairs to enter   [employer] of Steps: 3 Home Layout: Laundry or work area in basement        Prior Function Level of Independence: Independent         Comments: Pt was able to      Hand Dominance        Extremity/Trunk Assessment   Upper Extremity Assessment Upper Extremity Assessment: Overall WFL for tasks assessed(shld elevation R ~140, L ~110, good strength)    Lower  Extremity Assessment Lower Extremity Assessment: Overall WFL for tasks assessed       Communication   Communication: No difficulties  Cognition Arousal/Alertness: Awake/alert Behavior During Therapy: WFL for tasks assessed/performed Overall Cognitive Status: Within Functional Limits for tasks assessed                                        General Comments      Exercises     Assessment/Plan    PT Assessment Patient needs continued PT services  PT Problem List Decreased strength;Decreased range of motion;Decreased activity tolerance;Decreased balance       PT Treatment Interventions Gait training;Stair  training;Functional mobility training;Therapeutic exercise;Therapeutic activities;Balance training;Neuromuscular re-education;Patient/family education    PT Goals (Current goals can be found in the Care Plan section)  Acute Rehab PT Goals Patient Stated Goal: get breathing better PT Goal Formulation: With patient Time For Goal Achievement: 09/14/19 Potential to Achieve Goals: Good    Frequency Min 2X/week   Barriers to discharge        Co-evaluation               AM-PAC PT "6 Clicks" Mobility  Outcome Measure Help needed turning from your back to your side while in a flat bed without using bedrails?: None Help needed moving from lying on your back to sitting on the side of a flat bed without using bedrails?: None Help needed moving to and from a bed to a chair (including a wheelchair)?: None Help needed standing up from a chair using your arms (e.g., wheelchair or bedside chair)?: None Help needed to walk in hospital room?: None Help needed climbing 3-5 steps with a railing? : None 6 Click Score: 24    End of Session Equipment Utilized During Treatment: Gait belt;Oxygen(2L at rest, 3L during ambulation) Activity Tolerance: Patient limited by fatigue Patient left: in bed;with call bell/phone within reach Nurse Communication: Mobility status PT Visit Diagnosis: Muscle weakness (generalized) (M62.81);Difficulty in walking, not elsewhere classified (R26.2)    Time: 1000-1030 PT Time Calculation (min) (ACUTE ONLY): 30 min   Charges:   PT Evaluation $PT Eval Low Complexity: 1 Low PT Treatments $Gait Training: 8-22 mins        Kreg Shropshire, DPT 08/31/2019, 2:07 PM

## 2019-08-31 NOTE — Progress Notes (Signed)
Patient ID: Austin Patterson, male   DOB: 1955-11-14, 63 y.o.   MRN: [id number] [facility] PROGRESS NOTE  Austin Patterson EHU:[id number] DOB: 01/18/1956 DOA: 08/30/2019 PCP: Lavonne Chick, MD  HPI/Subjective: Patient states he is breathing only a little bit better than yesterday when he came in.  He has a history of interstitial pulmonary fibrosis on chronic 2 L of oxygen.  He has been coughing but dry cough.  He has had congestion in his chest.  He has been wheezing.  He has been short of breath.  Patient also having pain in his right upper quadrant.  Slight nausea yesterday but none today.  Objective: Vitals:   08/31/19 0337 08/31/19 0837  BP: (!) 152/86   Pulse: 99   Resp: 18   Temp: 98.2 F (36.8 C)   SpO2: 94% 92%    Intake/Output Summary (Last 24 hours) at 08/31/2019 1248 Last data filed at 08/31/2019 0900 Gross per 24 hour  Intake 720 ml  Output 2075 ml  Net -1355 ml   Filed Weights   08/30/19 0903 08/31/19 0337  Weight: 94.8 kg 92.3 kg    ROS: Review of Systems  Constitutional: Negative for chills and fever.  Eyes: Negative for blurred vision.  Respiratory: Positive for cough, shortness of breath and wheezing.   Cardiovascular: Negative for chest pain.  Gastrointestinal: Positive for abdominal pain. Negative for constipation, diarrhea, nausea and vomiting.  Genitourinary: Negative for dysuria.  Musculoskeletal: Negative for joint pain.  Neurological: Negative for dizziness and headaches.   Exam: Physical Exam  Constitutional: He is oriented to person, place, and time.  HENT:  Nose: No mucosal edema.  Mouth/Throat: No oropharyngeal exudate or posterior oropharyngeal edema.  Eyes: Pupils are equal, round, and reactive to light. Conjunctivae, EOM and lids are normal.  Neck: No JVD present. Carotid bruit is not present. No edema present. No thyroid mass and no thyromegaly present.  Cardiovascular: S1 normal and S2 normal. Tachycardia present. Exam  reveals no gallop.  No murmur heard. Pulses:      Dorsalis pedis pulses are 2+ on the right side and 2+ on the left side.  Respiratory: No respiratory distress. He has decreased breath sounds in the right middle field, the right lower field, the left middle field and the left lower field. He has wheezes in the right middle field and the left middle field. He has rhonchi in the right lower field and the left lower field. He has no rales.  GI: Soft. Bowel sounds are normal. There is abdominal tenderness in the right upper quadrant.  Musculoskeletal:     Right ankle: He exhibits no swelling.     Left ankle: He exhibits no swelling.  Lymphadenopathy:    He has no cervical adenopathy.  Neurological: He is alert and oriented to person, place, and time. No cranial nerve deficit.  Skin: Skin is warm. No rash noted. Nails show no clubbing.  Psychiatric: He has a normal mood and affect.      Data Reviewed: Basic Metabolic Panel: Recent Labs  Lab 08/30/19 0910 08/31/19 0433  NA 127* 131*  K 4.0 4.0  CL 90* 97*  CO2 25 24  GLUCOSE 126* 161*  BUN 13 16  CREATININE 0.91 0.74  CALCIUM 9.3 9.3   Liver Function Tests: Recent Labs  Lab 08/31/19 0433  AST 25  ALT 23  ALKPHOS 54  BILITOT 0.6  PROT 7.1  ALBUMIN 4.1   CBC: Recent Labs  Lab 08/30/19 0910 08/31/19  0433  WBC 9.7 14.1*  NEUTROABS 7.6  --   HGB 11.7* 10.6*  HCT 34.6* 31.7*  MCV 90.8 91.6  PLT 527* 565*   BNP (last 3 results) Recent Labs    08/30/19 0910  BNP 26.0    Recent Results (from the past 240 hour(s))  SARS CORONAVIRUS 2 (TAT 6-24 HRS) Nasopharyngeal Nasopharyngeal Swab     Status: None   Collection Time: 08/30/19 10:08 AM   Specimen: Nasopharyngeal Swab  Result Value Ref Range Status   SARS Coronavirus 2 NEGATIVE NEGATIVE Final    Comment: (NOTE) SARS-CoV-2 target nucleic acids are NOT DETECTED. The SARS-CoV-2 RNA is generally detectable in upper and lower respiratory specimens during the acute  phase of infection. Negative results do not preclude SARS-CoV-2 infection, do not rule out co-infections with other pathogens, and should not be used as the sole basis for treatment or other patient management decisions. Negative results must be combined with clinical observations, patient history, and epidemiological information. The expected result is Negative. Fact Sheet for Patients: SugarRoll.be Fact Sheet for Healthcare Providers: https://www.woods-mathews.com/ This test is not yet approved or cleared by the [place] FDA and  has been authorized for detection and/or diagnosis of SARS-CoV-2 by FDA under an Emergency Use Authorization (EUA). This EUA will remain  in effect (meaning this test can be used) for the duration of the COVID-19 declaration under Section 56 4(b)(1) of the Act, 21 U.S.C. section 360bbb-3(b)(1), unless the authorization is terminated or revoked sooner. Performed at [facility], [place] [address], [place], [place] [address]   Culture, blood (Routine X 2) w Reflex to ID Panel     Status: None (Preliminary result)   Collection Time: 08/30/19 12:27 PM   Specimen: BLOOD  Result Value Ref Range Status   Specimen Description BLOOD RIGHT FA  Final   Special Requests   Final    BOTTLES DRAWN AEROBIC AND ANAEROBIC Blood Culture results may not be optimal due to an excessive volume of blood received in culture bottles   Culture   Final    NO GROWTH < 24 HOURS Performed at [facility], [address] [place], [place] [address]    Report Status PENDING  Incomplete  Respiratory Panel by PCR     Status: None   Collection Time: 08/30/19 12:27 PM   Specimen: Flu Kit Nasopharyngeal Swab; Respiratory  Result Value Ref Range Status   Adenovirus NOT DETECTED NOT DETECTED Final   Coronavirus 229E NOT DETECTED NOT DETECTED Final    Comment: (NOTE) The Coronavirus on the Respiratory Panel, DOES NOT test for the  novel  Coronavirus (2019 nCoV)    Coronavirus HKU1 NOT DETECTED NOT DETECTED Final   Coronavirus NL63 NOT DETECTED NOT DETECTED Final   Coronavirus OC43 NOT DETECTED NOT DETECTED Final   Metapneumovirus NOT DETECTED NOT DETECTED Final   Rhinovirus / Enterovirus NOT DETECTED NOT DETECTED Final   Influenza A NOT DETECTED NOT DETECTED Final   Influenza B NOT DETECTED NOT DETECTED Final   Parainfluenza Virus 1 NOT DETECTED NOT DETECTED Final   Parainfluenza Virus 2 NOT DETECTED NOT DETECTED Final   Parainfluenza Virus 3 NOT DETECTED NOT DETECTED Final   Parainfluenza Virus 4 NOT DETECTED NOT DETECTED Final   Respiratory Syncytial Virus NOT DETECTED NOT DETECTED Final   Bordetella pertussis NOT DETECTED NOT DETECTED Final   Chlamydophila pneumoniae NOT DETECTED NOT DETECTED Final   Mycoplasma pneumoniae NOT DETECTED NOT DETECTED Final    Comment: Performed  at [facility], [place] [address], [place], [place] [address]  Culture, blood (Routine X 2) w Reflex to ID Panel     Status: None (Preliminary result)   Collection Time: 08/30/19 12:28 PM   Specimen: BLOOD  Result Value Ref Range Status   Specimen Description BLOOD BLOOD LEFT HAND  Final   Special Requests   Final    BOTTLES DRAWN AEROBIC AND ANAEROBIC Blood Culture adequate volume   Culture   Final    NO GROWTH < 24 HOURS Performed at [facility], [address] [place], [place] [address]    Report Status PENDING  Incomplete     Studies: Dg Chest Portable 1 View  Result Date: 08/30/2019 CLINICAL DATA:  Shortness of breath, COPD, chest pain, history of pulmonary fibrosis EXAM: PORTABLE CHEST 1 VIEW COMPARISON:  08/10/2019 FINDINGS: The heart size and mediastinal contours are within normal limits. Slight interval increase in diffuse interstitial opacity, most conspicuous in the right lung base and left upper lobe. Redemonstrated underlying fibrotic scarring and emphysema. The visualized skeletal structures are  unremarkable. IMPRESSION: Slight interval increase in diffuse interstitial opacity, most conspicuous in the right lung base and left upper lobe, concerning for infection or edema. Redemonstrated underlying fibrotic scarring and emphysema. Electronically Signed   By: Eddie Candle M.D.   On: 08/30/2019 09:16    Scheduled Meds: . amLODipine  10 mg Oral Daily  . aspirin EC  81 mg Oral Daily  . atorvastatin  40 mg Oral QHS  . calcium-vitamin D  2 tablet Oral Q breakfast  . clopidogrel  75 mg Oral Daily  . docusate sodium  100 mg Oral BID  . enoxaparin (LOVENOX) injection  40 mg Subcutaneous Q24H  . fluticasone furoate-vilanterol  1 puff Inhalation Daily  . guaiFENesin  600 mg Oral BID  . ipratropium-albuterol  3 mL Nebulization Q6H  . loratadine  10 mg Oral Daily  . magnesium oxide  400 mg Oral Daily  . methylPREDNISolone (SOLU-MEDROL) injection  40 mg Intravenous Q12H  . metoprolol succinate  50 mg Oral Daily  . multivitamin with minerals  1 tablet Oral Daily  . pantoprazole  40 mg Oral BID  . potassium chloride SA  20 mEq Oral Daily  . sodium chloride flush  3 mL Intravenous Q12H  . tiotropium  18 mcg Inhalation Daily   Continuous Infusions: . sodium chloride    . azithromycin 500 mg (08/31/19 0940)  . ceFEPime (MAXIPIME) IV 2 g (08/31/19 0533)    Assessment/Plan:  1. Multifocal pneumonia (right lower lobe and left upper lobe, community-acquired failed outpatient treatment) with idiopathic pulmonary fibrosis and COPD exacerbation.  Continue Solu-Medrol, Maxipime and Zithromax.  COVID-19 testing negative.  Start nebulizer treatments. 2. Interstitial pulmonary fibrosis and COPD exacerbation continue steroids and nebulizer treatments 3. Acute hypoxic respiratory failure and tachycardia with ambulation.  Pulse ox dropped down into the low 80s with ambulation and heart rate up into the 130s to 140s.  Patient on metoprolol already.  I may have to change his amlodipine over to Cardizem if  this continues.  Hopefully once we get better air entry in the lungs his heart rate will come down. 4. Essential hypertension on metoprolol, losartan HCT and amlodipine.  Hydrochlorothiazide was stopped secondary to hyponatremia. 5. Right upper quadrant pain.  Will get sonogram right upper quadrant to evaluate the gallbladder 6. History of CAD on aspirin Plavix and statin.  Patient also on Toprol-XL. 7. Hyperlipidemia on atorvastatin 8. GERD  on Protonix  Code Status:     Code Status Orders  (From admission, onward)         Start     Ordered   08/30/19 1428  Full code  Continuous     08/30/19 1428        Code Status History    Date Active Date Inactive Code Status Order ID Comments User Context   06/17/2017 1814 06/18/2017 1901 Full Code [id number]  Nicholes Mango, MD Inpatient   03/31/2017 1924 04/01/2017 1426 Full Code [id number]  Fritzi Mandes, MD ED   09/21/2016 0914 09/23/2016 1902 Full Code [id number]  Fritzi Mandes, MD Inpatient   12/09/2015 1705 12/16/2015 1803 Full Code [id number]  Bettey Costa, MD Inpatient   10/14/2015 1342 10/16/2015 1629 Full Code [id number]  Nicholes Mango, MD Inpatient   Advance Care Planning Activity      Disposition Plan: Patient will need to move better air prior to disposition.  Antibiotics:  Maxipime  Zithromax  Time spent: 28 minutes  [place]

## 2019-09-01 DIAGNOSIS — J9621 Acute and chronic respiratory failure with hypoxia: Secondary | ICD-10-CM

## 2019-09-01 LAB — LEGIONELLA PNEUMOPHILA SEROGP 1 UR AG: L. pneumophila Serogp 1 Ur Ag: NEGATIVE

## 2019-09-01 LAB — BASIC METABOLIC PANEL
Anion gap: 12 (ref 5–15)
BUN: 20 mg/dL (ref 8–23)
CO2: 23 mmol/L (ref 22–32)
Calcium: 9.1 mg/dL (ref 8.9–10.3)
Chloride: 101 mmol/L (ref 98–111)
Creatinine, Ser: 0.84 mg/dL (ref 0.61–1.24)
GFR calc Af Amer: >60 mL/min (ref >60)
GFR calc non Af Amer: >60 mL/min (ref >60)
Glucose, Bld: 156 mg/dL — ABNORMAL HIGH (ref 70–99)
Potassium: 4 mmol/L (ref 3.5–5.1)
Sodium: 136 mmol/L (ref 135–145)

## 2019-09-01 LAB — PROCALCITONIN: Procalcitonin: <0.1 ng/mL

## 2019-09-01 MED ORDER — AZITHROMYCIN 250 MG PO TABS: 500.0000 mg | ORAL_TABLET | Freq: Every day | ORAL | Status: DC

## 2019-09-01 MED ORDER — AMOXICILLIN-POT CLAVULANATE 875-125 MG PO TABS
1.0000 | ORAL_TABLET | Freq: Two times a day (BID) | ORAL | Status: DC
  Administered 2019-09-01 – 2019-09-02 (×2): 1 via ORAL
  Filled 2019-09-01 (×2): qty 1

## 2019-09-01 MED ORDER — AZITHROMYCIN 250 MG PO TABS
250.0000 mg | ORAL_TABLET | Freq: Every day | ORAL | Status: DC
  Administered 2019-09-02: 250 mg via ORAL
  Filled 2019-09-01: qty 1

## 2019-09-01 NOTE — Consult Note (Signed)
Pharmacy Antibiotic Note  Austin Patterson is a 63 y.o. male with past medical history of COPD and pulmonary fibrosis on chronic supplemental oxygen admitted on 08/30/2019 with pneumonia. Patient reports worsening shortness of breath, cough, and difficulty breathing over the last 3-4 days. Reports recent visit to PCP and antibiotic course which did not improve symptoms. Pharmacy has been consulted for cefepime dosing. Patient also ordered azithromycin.   PCT <0.10, WBC 9.7 without left shift, afebrile. CXR with interval increase in diffuse interstitial opacity.   Plan: Will continue cefepime 2 g q8h. Day 3 of total abx.   Height: 5\' 7"  (170.2 cm) Weight: 202 lb 8 oz (91.9 kg) IBW/kg (Calculated) : 66.1  Temp (24hrs), Avg:97.8 F (36.6 C), Min:97.7 F (36.5 C), Max:97.9 F (36.6 C)  Recent Labs  Lab 08/30/19 0910 08/31/19 0433 09/01/19 0558  WBC 9.7 14.1*  --   CREATININE 0.91 0.74 0.84    Estimated Creatinine Clearance: 97.3 mL/min (by C-G formula based on SCr of 0.84 mg/dL).    No Known Allergies  Antimicrobials this admission: Azithromycin 11/11 >>  Cefepime 11/11 >>  Microbiology results: 11/11 BCx: pending 11/11 Respiratory panel: negative 11/11 COVID-19: negative  Thank you for allowing pharmacy to be a part of this patient's care.  Oswald Hillock, PharmD, BCPS 09/01/2019 9:28 AM

## 2019-09-01 NOTE — Care Management Important Message (Signed)
Important Message  Patient Details  Name: SHOJI DIZE MRN: [id number] Date of Birth: [birth year]   Medicare Important Message Given:  Yes     Dannette Barbara 09/01/2019, 5:18 PM

## 2019-09-01 NOTE — Evaluation (Signed)
Occupational Therapy Evaluation Patient Details Name: Austin Patterson MRN: [id number] DOB: 03/26/1956 Today's Date: 09/01/2019    History of Present Illness  63 y.o. male with past medical history of COPD, hypertension, hyperlipidemia, pulmonary fibrosis on 2 L of oxygen by nasal cannula at home, history of CAD status post stent presented to the hospital with worsening shortness of breath, cough and difficulty breathing for the few days before coming and being diagnosed with pneumonia.   Clinical Impression   Pt seen for OT evaluation this date. Pt was modified independent in all ADLs and mobility (+time/effort), living in a 1 story home with laundry in the basement (external door and stairs access from side of house). Pt on 2 liters of O2 at home (takes off to go to basement for laundry). Pt reports becoming easily fatigued or out of breath with minimize exertion. Pt reports having someone come clean the house but otherwise does all else by himself. Pt currently requires ++time/effort to perform ADL and functional mobility tasks due to poor activity tolerance and SOB. Pt educated in energy conservation conservation strategies including pursed lip breathing, activity pacing, home/routines modifications, work simplification, AE/DME, prioritizing of meaningful occupations, and falls prevention. Handout provided. Pt verbalized understanding but would benefit from additional skilled OT services to maximize recall and carryover of learned techniques and facilitate implementation of learned techniques into daily routines. Upon discharge, recommend [place] services.      Follow Up Recommendations  Home health OT    Equipment Recommendations  Other (comment);Tub/shower bench(handheld shower head)    Recommendations for Other Services       Precautions / Restrictions Precautions Precautions: None Restrictions Weight Bearing Restrictions: No      Mobility Bed Mobility               General  bed mobility comments: deferred, in recliner  Transfers Overall transfer level: Modified independent Equipment used: None                  Balance Overall balance assessment: Modified Independent                                         ADL either performed or assessed with clinical judgement   ADL Overall ADL's : Modified independent                                       General ADL Comments: Near baseline, requiring supervision and additional time to perform ADL tasks     Vision Baseline Vision/History: Wears glasses Wears Glasses: At all times Patient Visual Report: No change from baseline       Perception     Praxis      Pertinent Vitals/Pain Pain Assessment: No/denies pain     Hand Dominance     Extremity/Trunk Assessment Upper Extremity Assessment Upper Extremity Assessment: Overall WFL for tasks assessed(shld elevation R ~140, L ~110, good strength)   Lower Extremity Assessment Lower Extremity Assessment: Overall WFL for tasks assessed       Communication Communication Communication: No difficulties   Cognition Arousal/Alertness: Awake/alert Behavior During Therapy: WFL for tasks assessed/performed Overall Cognitive Status: Within Functional Limits for tasks assessed  General Comments  On 2L, O2 sats at 95%    Exercises Other Exercises Other Exercises: Pt instructed in home/routines modifications, energy conservation strategies and handout provided to minimize readmission, SOB and over exertion   Shoulder Instructions      Home Living Family/patient expects to be discharged to:: Private residence Living Arrangements: Alone Available Help at Discharge: (Reports he could have some minimal inconsistent help) Type of Home: House Home Access: Stairs to enter [employer] of Steps: 3   Home Layout: Laundry or work area in basement Alternate Clinical research associate of Steps: not a full flight, ~6 steps   Bathroom Shower/Tub: Teacher, early years/pre: Standard     Home Equipment: Financial controller: Reacher Additional Comments: Pt has O2 at home but takes it off to go downstairs to basement to get laundry      Prior Functioning/Environment Level of Independence: Independent        Comments: Pt was able to perform ADL and mobiliyu without assist prior to recent hospitalizations; increased WOB and SOB noted recently requiring extensive additional time to perform        OT Problem List: Decreased strength;Decreased activity tolerance;Cardiopulmonary status limiting activity;Decreased knowledge of use of DME or AE      OT Treatment/Interventions: Self-care/ADL training;Therapeutic exercise;Therapeutic activities;Energy conservation;DME and/or AE instruction;Patient/family education;Balance training    OT Goals(Current goals can be found in the care plan section) Acute Rehab OT Goals Patient Stated Goal: go home and breathe better OT Goal Formulation: With patient Time For Goal Achievement: 09/15/19 Potential to Achieve Goals: Good ADL Goals Pt Will Perform Tub/Shower Transfer: Tub transfer;with modified independence;tub bench Additional ADL Goal #1: Pt will verbalize plan to implement at least 2 learned ECS to support maximized safety and independence with ADL while minimizing risk of falls and readmission. Additional ADL Goal #2: Pt will utilize PLB to support breath recovery during ADL tasks with independence.  OT Frequency: Min 1X/week   Barriers to D/C:            Co-evaluation              AM-PAC OT "6 Clicks" Daily Activity     Outcome Measure Help from another person eating meals?: None Help from another person taking care of personal grooming?: None Help from another person toileting, which includes using toliet, bedpan, or urinal?: A Little Help from another person bathing  (including washing, rinsing, drying)?: A Little Help from another person to put on and taking off regular upper body clothing?: None Help from another person to put on and taking off regular lower body clothing?: A Little 6 Click Score: 21   End of Session    Activity Tolerance: Patient tolerated treatment well Patient left: in chair;with call bell/phone within reach;with chair alarm set  OT Visit Diagnosis: Other abnormalities of gait and mobility (R26.89)                Time: [id number] OT Time Calculation (min): 37 min Charges:  OT General Charges $OT Visit: 1 Visit OT Evaluation $OT Eval Low Complexity: 1 Low OT Treatments $Self Care/Home Management : 8-22 mins  Jeni Salles, MPH, MS, OTR/L ascom [phone] 09/01/19, 3:33 PM

## 2019-09-01 NOTE — Progress Notes (Addendum)
Patient ID: Austin Patterson, male   DOB: 1956/06/08, 63 y.o.   MRN: [id number] [facility] PROGRESS NOTE  Austin Patterson [id number] DOB: May 04, 1956 DOA: 08/30/2019 PCP: Austin Chick, MD  HPI/Subjective: Patient feeling better today than when he came in.  Still short of breath still coughing and wheezing.  Still short of breath especially with ambulating.  Objective: Vitals:   09/01/19 0814 09/01/19 0814  BP: (!) 146/80 (!) 146/80  Pulse: 98 96  Resp:    Temp: 97.7 F (36.5 C) 97.7 F (36.5 C)  SpO2: 94% 94%    Intake/Output Summary (Last 24 hours) at 09/01/2019 1439 Last data filed at 09/01/2019 1300 Gross per 24 hour  Intake 2330 ml  Output 0 ml  Net 2330 ml   Filed Weights   08/30/19 0903 08/31/19 0337 09/01/19 0500  Weight: 94.8 kg 92.3 kg 91.9 kg    ROS: Review of Systems  Constitutional: Negative for chills and fever.  Eyes: Negative for blurred vision.  Respiratory: Positive for cough, shortness of breath and wheezing.   Cardiovascular: Negative for chest pain.  Gastrointestinal: Positive for abdominal pain. Negative for constipation, diarrhea, nausea and vomiting.  Genitourinary: Negative for dysuria.  Musculoskeletal: Negative for joint pain.  Neurological: Negative for dizziness and headaches.   Exam: Physical Exam  Constitutional: He is oriented to person, place, and time.  HENT:  Nose: No mucosal edema.  Mouth/Throat: No oropharyngeal exudate or posterior oropharyngeal edema.  Eyes: Pupils are equal, round, and reactive to light. Conjunctivae, EOM and lids are normal.  Neck: No JVD present. Carotid bruit is not present. No edema present. No thyroid mass and no thyromegaly present.  Cardiovascular: S1 normal and S2 normal. Tachycardia present. Exam reveals no gallop.  No murmur heard. Pulses:      Dorsalis pedis pulses are 2+ on the right side and 2+ on the left side.  Respiratory: No respiratory distress. He has decreased breath  sounds in the right middle field, the right lower field, the left middle field and the left lower field. He has no wheezes. He has no rhonchi. He has no rales.  GI: Soft. Bowel sounds are normal. There is no abdominal tenderness.  Musculoskeletal:     Right ankle: He exhibits no swelling.     Left ankle: He exhibits no swelling.  Lymphadenopathy:    He has no cervical adenopathy.  Neurological: He is alert and oriented to person, place, and time. No cranial nerve deficit.  Skin: Skin is warm. No rash noted. Nails show no clubbing.  Psychiatric: He has a normal mood and affect.      Data Reviewed: Basic Metabolic Panel: Recent Labs  Lab 08/30/19 0910 08/31/19 0433 09/01/19 0558  NA 127* 131* 136  K 4.0 4.0 4.0  CL 90* 97* 101  CO2 '25 24 23  ' GLUCOSE 126* 161* 156*  BUN '13 16 20  ' CREATININE 0.91 0.74 0.84  CALCIUM 9.3 9.3 9.1   Liver Function Tests: Recent Labs  Lab 08/31/19 0433  AST 25  ALT 23  ALKPHOS 54  BILITOT 0.6  PROT 7.1  ALBUMIN 4.1   CBC: Recent Labs  Lab 08/30/19 0910 08/31/19 0433  WBC 9.7 14.1*  NEUTROABS 7.6  --   HGB 11.7* 10.6*  HCT 34.6* 31.7*  MCV 90.8 91.6  PLT 527* 565*   BNP (last 3 results) Recent Labs    08/30/19 0910  BNP 26.0    Recent Results (from the past 240 hour(s))  SARS CORONAVIRUS 2 (TAT 6-24 HRS) Nasopharyngeal Nasopharyngeal Swab     Status: None   Collection Time: 08/30/19 10:08 AM   Specimen: Nasopharyngeal Swab  Result Value Ref Range Status   SARS Coronavirus 2 NEGATIVE NEGATIVE Final    Comment: (NOTE) SARS-CoV-2 target nucleic acids are NOT DETECTED. The SARS-CoV-2 RNA is generally detectable in upper and lower respiratory specimens during the acute phase of infection. Negative results do not preclude SARS-CoV-2 infection, do not rule out co-infections with other pathogens, and should not be used as the sole basis for treatment or other patient management decisions. Negative results must be combined with  clinical observations, patient history, and epidemiological information. The expected result is Negative. Fact Sheet for Patients: SugarRoll.be Fact Sheet for Healthcare Providers: https://www.woods-mathews.com/ This test is not yet approved or cleared by the [place] FDA and  has been authorized for detection and/or diagnosis of SARS-CoV-2 by FDA under an Emergency Use Authorization (EUA). This EUA will remain  in effect (meaning this test can be used) for the duration of the COVID-19 declaration under Section 56 4(b)(1) of the Act, 21 U.S.C. section 360bbb-3(b)(1), unless the authorization is terminated or revoked sooner. Performed at [facility], [place] [address] [place], [place] [address]   Culture, blood (Routine X 2) w Reflex to ID Panel     Status: None (Preliminary result)   Collection Time: 08/30/19 12:27 PM   Specimen: BLOOD  Result Value Ref Range Status   Specimen Description BLOOD RIGHT FA  Final   Special Requests   Final    BOTTLES DRAWN AEROBIC AND ANAEROBIC Blood Culture results may not be optimal due to an excessive volume of blood received in culture bottles   Culture   Final    NO GROWTH 2 DAYS Performed at [facility], [address] [place], [place] [address]    Report Status PENDING  Incomplete  Respiratory Panel by PCR     Status: None   Collection Time: 08/30/19 12:27 PM   Specimen: Flu Kit Nasopharyngeal Swab; Respiratory  Result Value Ref Range Status   Adenovirus NOT DETECTED NOT DETECTED Final   Coronavirus 229E NOT DETECTED NOT DETECTED Final    Comment: (NOTE) The Coronavirus on the Respiratory Panel, DOES NOT test for the novel  Coronavirus (2019 nCoV)    Coronavirus HKU1 NOT DETECTED NOT DETECTED Final   Coronavirus NL63 NOT DETECTED NOT DETECTED Final   Coronavirus OC43 NOT DETECTED NOT DETECTED Final   Metapneumovirus NOT DETECTED NOT DETECTED Final   Rhinovirus / Enterovirus  NOT DETECTED NOT DETECTED Final   Influenza A NOT DETECTED NOT DETECTED Final   Influenza B NOT DETECTED NOT DETECTED Final   Parainfluenza Virus 1 NOT DETECTED NOT DETECTED Final   Parainfluenza Virus 2 NOT DETECTED NOT DETECTED Final   Parainfluenza Virus 3 NOT DETECTED NOT DETECTED Final   Parainfluenza Virus 4 NOT DETECTED NOT DETECTED Final   Respiratory Syncytial Virus NOT DETECTED NOT DETECTED Final   Bordetella pertussis NOT DETECTED NOT DETECTED Final   Chlamydophila pneumoniae NOT DETECTED NOT DETECTED Final   Mycoplasma pneumoniae NOT DETECTED NOT DETECTED Final    Comment: Performed at [facility], [place] [address] [place], [place] [address]  Culture, blood (Routine X 2) w Reflex to ID Panel     Status: None (Preliminary result)   Collection Time: 08/30/19 12:28 PM   Specimen: BLOOD  Result Value Ref Range Status   Specimen Description BLOOD BLOOD LEFT  HAND  Final   Special Requests   Final    BOTTLES DRAWN AEROBIC AND ANAEROBIC Blood Culture adequate volume   Culture   Final    NO GROWTH 2 DAYS Performed at [facility], [address] [place], [place] [address]    Report Status PENDING  Incomplete     Studies: US Abdomen Limited Ruq  Result Date: 08/31/2019 CLINICAL DATA:  63 year old male with right upper quadrant abdominal pain. EXAM: ULTRASOUND ABDOMEN LIMITED RIGHT UPPER QUADRANT COMPARISON:  Abdominal ultrasound dated 06/14/2018. FINDINGS: Gallbladder: No gallstones or wall thickening visualized. No sonographic Murphy sign noted by sonographer. Common bile duct: Diameter: 4 mm Liver: There is diffuse increased liver echogenicity most commonly seen in the setting of fatty infiltration. Superimposed inflammation or fibrosis is not excluded. Clinical correlation is recommended. Portal vein is patent on color Doppler imaging with normal direction of blood flow towards the liver. Other: None. IMPRESSION: Fatty liver, otherwise unremarkable right upper  quadrant ultrasound. Electronically Signed   By: Anner Crete M.D.   On: 08/31/2019 16:41    Scheduled Meds: . amLODipine  10 mg Oral Daily  . aspirin EC  81 mg Oral Daily  . atorvastatin  40 mg Oral QHS  . azithromycin  500 mg Oral Daily  . calcium-vitamin D  2 tablet Oral Q breakfast  . clopidogrel  75 mg Oral Daily  . docusate sodium  100 mg Oral BID  . enoxaparin (LOVENOX) injection  40 mg Subcutaneous Q24H  . fluticasone furoate-vilanterol  1 puff Inhalation Daily  . guaiFENesin  600 mg Oral BID  . ipratropium-albuterol  3 mL Nebulization Q6H  . loratadine  10 mg Oral Daily  . magnesium oxide  400 mg Oral Daily  . methylPREDNISolone (SOLU-MEDROL) injection  40 mg Intravenous Q12H  . metoprolol succinate  50 mg Oral Daily  . multivitamin with minerals  1 tablet Oral Daily  . pantoprazole  40 mg Oral BID  . potassium chloride SA  20 mEq Oral Daily  . sodium chloride flush  3 mL Intravenous Q12H  . tiotropium  18 mcg Inhalation Daily   Continuous Infusions: . sodium chloride    . ceFEPime (MAXIPIME) IV 2 g (09/01/19 0523)    Assessment/Plan:  1. Multifocal pneumonia (right lower lobe and left upper lobe, community-acquired failed outpatient treatment) with idiopathic pulmonary fibrosis and COPD exacerbation.  Continue Solu-Medrol, Augmentin and Zithromax.  With procalcitonin negative I will downgrade antibiotics.  COVID-19 testing negative.  Continue nebulizer treatments.  Patient still with poor air entry.  Evaluate daily on disposition. 2. Interstitial pulmonary fibrosis and COPD exacerbation continue steroids and nebulizer treatments 3. Acute hypoxic respiratory failure and tachycardia with ambulation.  Pulse ox dropped down into the low 80s with ambulation and heart rate up into the 130s to 140s.  Patient on metoprolol already.  Hopefully once we get better air entry in the lungs the pulse ox and heart rate will improve. 4. Essential hypertension on metoprolol, losartan  HCT and amlodipine.  Hydrochlorothiazide was stopped secondary to hyponatremia. 5. Right upper quadrant pain.  Ultrasound negative.  Pain has resolved. 6. History of CAD on aspirin Plavix and statin.  Patient also on Toprol-XL. 7. Hyperlipidemia on atorvastatin 8. GERD on Protonix  Code Status:     Code Status Orders  (From admission, onward)         Start     Ordered   08/30/19 1428  Full code  Continuous     08/30/19  1428        Code Status History    Date Active Date Inactive Code Status Order ID Comments User Context   06/17/2017 1814 06/18/2017 1901 Full Code [id number]  Nicholes Mango, MD Inpatient   03/31/2017 1924 04/01/2017 1426 Full Code [id number]  Fritzi Mandes, MD ED   09/21/2016 0914 09/23/2016 1902 Full Code [id number]  Fritzi Mandes, MD Inpatient   12/09/2015 1705 12/16/2015 1803 Full Code [id number]  Bettey Costa, MD Inpatient   10/14/2015 1342 10/16/2015 1629 Full Code [id number]  Nicholes Mango, MD Inpatient   Advance Care Planning Activity      Disposition Plan: Evaluate on a day-to-day basis on when to go home  Antibiotics:  Augmentin  Zithromax  Time spent: 27 minutes  [place]

## 2019-09-02 LAB — PROCALCITONIN: Procalcitonin: <0.1 ng/mL

## 2019-09-02 MED ORDER — IPRATROPIUM-ALBUTEROL 0.5-2.5 (3) MG/3ML IN SOLN
3.0000 mL | Freq: Three times a day (TID) | RESPIRATORY_TRACT | Status: DC
  Filled 2019-09-02: qty 3

## 2019-09-02 MED ORDER — AMOXICILLIN-POT CLAVULANATE 875-125 MG PO TABS: 1.0000 | ORAL_TABLET | Freq: Two times a day (BID) | ORAL | 0 refills | Status: AC

## 2019-09-02 MED ORDER — ALBUTEROL SULFATE (2.5 MG/3ML) 0.083% IN NEBU: 2.5000 mg | INHALATION_SOLUTION | RESPIRATORY_TRACT | Status: DC | PRN

## 2019-09-02 MED ORDER — PREDNISONE 10 MG PO TABS: ORAL_TABLET | ORAL | 0 refills | Status: DC

## 2019-09-02 MED ORDER — AZITHROMYCIN 250 MG PO TABS: ORAL_TABLET | ORAL | 0 refills | Status: DC

## 2019-09-02 NOTE — Progress Notes (Signed)
Austin Patterson to be D/C'd Home per MD order.  Discussed prescriptions and follow up appointments with the patient. Prescriptions were e-prescribed, medication list explained in detail. Pt verbalized understanding.    Vitals:   09/02/19 0845 09/02/19 1010  BP:  (!) 151/76  Pulse: 92 89  Resp: 18   Temp:    SpO2: 93% 100%    Tele box removed and returned. Skin clean, dry and intact without evidence of skin break down, no evidence of skin tears noted. IV catheter discontinued intact. Site without signs and symptoms of complications. Dressing and pressure applied. Pt denies pain at this time. No complaints noted.  An After Visit Summary was printed and given to the patient. Patient escorted via [place], and D/C home via private auto.  Rolley Sims

## 2019-09-02 NOTE — TOC Initial Note (Addendum)
Transition of Care [facility]) - Initial/Assessment Note    Patient Details  Name: Austin Patterson MRN: [id number] Date of Birth: [birth year]  [facility] CM/SW Contact:    Latanya Maudlin, RN Phone Number: 09/02/2019, 9:41 AM  Clinical Narrative:  TOC consulted following a home health recommendation from PT/OT. Patient reports he lives alone and occasionally gets assistance from his neighbor. Patient agreeable to therapy recommendations. CMS Medicare.gov Compare Post Acute Care list reviewed with patient and he has no preference of agency. Referral to [place] at [facility]. No DME needs reported.  Update- [facility] unable to take referral. Checking with Malachy Mood at [employer].   [facility] was able to accept referral however MD never placed home health orders. Call to patient to instruct him to talk with PCP if home health orders are needed he was on the fence prior to discharge about [place] need. Patient has chronic O2 already in place. I have sent confidential info to patients PCP per his preference about home health needs.                Expected Discharge Plan: [place] Barriers to Discharge: Continued Medical Work up   Patient Goals and CMS Choice     Choice offered to / list presented to : Patient  Expected Discharge Plan and Services Expected Discharge Plan: [place]   Discharge Planning Services: CM Consult Post Acute Care Choice: [place] arrangements for the past 2 months: Single Family Home                           HH Arranged: PT, OT HH Agency: [facility] Date [place]: 09/02/19 Time [place]: [phone] Representative spoke with at [place]: [place] Arrangements/Services Living arrangements for the past 2 months: [place] with:: Self              Current home services: Home OT, Home PT    Activities of Daily Living Home Assistive Devices/Equipment:  Eyeglasses, Oxygen ADL Screening (condition at time of admission) Patient's cognitive ability adequate to safely complete daily activities?: Yes Is the patient deaf or have difficulty hearing?: No Does the patient have difficulty seeing, even when wearing glasses/contacts?: No Does the patient have difficulty concentrating, remembering, or making decisions?: No Patient able to express need for assistance with ADLs?: Yes Does the patient have difficulty dressing or bathing?: No Independently performs ADLs?: Yes (appropriate for developmental age) Does the patient have difficulty walking or climbing stairs?: Yes Weakness of Legs: None Weakness of Arms/Hands: None  Permission Sought/Granted                  Emotional Assessment              Admission diagnosis:  Dyspnea on exertion [R06.00] IPF (idiopathic pulmonary fibrosis) ([place]) [J84.112] Community acquired pneumonia, unspecified laterality [J18.9] Patient Active Problem List   Diagnosis Date Noted  . Acute on chronic respiratory failure with hypoxia ([place])   . RUQ pain   . Essential hypertension   . IPF (idiopathic pulmonary fibrosis) ([place]) 08/30/2019  . Hypomagnesemia 03/31/2017  . Hiatal hernia   . Reflux esophagitis   . Hematemesis without nausea   . Hyponatremia 09/21/2016  . Multifocal pneumonia 12/09/2015  . COPD with acute exacerbation ([place]) 10/14/2015   PCP:  Lavonne Chick, MD Pharmacy:   [facility],  [place] - [address], [id number], [place] [address] Phone: [phone] Fax: [phone]  [id number] - [place], [place] [address] Phone: [phone] Fax: [place], [place] - [place] [address] Phone: [phone] Fax: [phone]     Social Determinants of Health ([place])  Interventions    Readmission Risk Interventions Readmission Risk Prevention Plan 09/02/2019  Transportation Screening Complete  PCP or Specialist Appt within 5-7 Days Complete  Home Care Screening Complete  Medication Review (RN CM) Complete  Some recent data might be hidden

## 2019-09-02 NOTE — Discharge Instructions (Signed)
Oxygen 2 liters at rest and 4 liters with ambulation.  Community-Acquired Pneumonia, Adult Pneumonia is an infection of the lungs. It causes swelling in the airways of the lungs. Mucus and fluid may also build up inside the airways. One type of pneumonia can happen while a person is in a hospital. A different type can happen when a person is not in a hospital (community-acquired pneumonia).  What are the causes?  This condition is caused by germs (viruses, bacteria, or fungi). Some types of germs can be passed from one person to another. This can happen when you breathe in droplets from the cough or sneeze of an infected person. What increases the risk? You are more likely to develop this condition if you:  Have a long-term (chronic) disease, such as: ? Chronic obstructive pulmonary disease (COPD). ? Asthma. ? Cystic fibrosis. ? Congestive heart failure. ? Diabetes. ? Kidney disease.  Have HIV.  Have sickle cell disease.  Have had your spleen removed.  Do not take good care of your teeth and mouth (poor dental hygiene).  Have a medical condition that increases the risk of breathing in droplets from your own mouth and nose.  Have a weakened body defense system (immune system).  Are a smoker.  Travel to areas where the germs that cause this illness are common.  Are around certain animals or the places they live. What are the signs or symptoms?  A dry cough.  A wet (productive) cough.  Fever.  Sweating.  Chest pain. This often happens when breathing deeply or coughing.  Fast breathing or trouble breathing.  Shortness of breath.  Shaking chills.  Feeling tired (fatigue).  Muscle aches. How is this treated? Treatment for this condition depends on many things. Most adults can be treated at home. In some cases, treatment must happen in a hospital. Treatment may include:  Medicines given by mouth or through an IV tube.  Being given extra oxygen.  Respiratory  therapy. In rare cases, treatment for very bad pneumonia may include:  Using a machine to help you breathe.  Having a procedure to remove fluid from around your lungs. Follow these instructions at home: Medicines  Take over-the-counter and prescription medicines only as told by your doctor. ? Only take cough medicine if you are losing sleep.  If you were prescribed an antibiotic medicine, take it as told by your doctor. Do not stop taking the antibiotic even if you start to feel better. General instructions   Sleep with your head and neck raised (elevated). You can do this by sleeping in a recliner or by putting a few pillows under your head.  Rest as needed. Get at least 8 hours of sleep each night.  Drink enough water to keep your pee (urine) pale yellow.  Eat a healthy diet that includes plenty of vegetables, fruits, whole grains, low-fat dairy products, and lean protein.  Do not use any products that contain nicotine or tobacco. These include cigarettes, e-cigarettes, and chewing tobacco. If you need help quitting, ask your doctor.  Keep all follow-up visits as told by your doctor. This is important. How is this prevented? A shot (vaccine) can help prevent pneumonia. Shots are often suggested for:  People older than 63 years of age.  People older than 63 years of age who: ? Are having cancer treatment. ? Have long-term (chronic) lung disease. ? Have problems with their body's defense system. You may also prevent pneumonia if you take these actions:  Get the  flu (influenza) shot every year.  Go to the dentist as often as told.  Wash your hands often. If you cannot use soap and water, use hand sanitizer. Contact a doctor if:  You have a fever.  You lose sleep because your cough medicine does not help. Get help right away if:  You are short of breath and it gets worse.  You have more chest pain.  Your sickness gets worse. This is very serious if: ? You are an  older adult. ? Your body's defense system is weak.  You cough up blood. Summary  Pneumonia is an infection of the lungs.  Most adults can be treated at home. Some will need treatment in a hospital.  Drink enough water to keep your pee pale yellow.  Get at least 8 hours of sleep each night. This information is not intended to replace advice given to you by your health care provider. Make sure you discuss any questions you have with your health care provider. Document Released: 03/23/2008 Document Revised: 01/25/2019 Document Reviewed: 06/02/2018 Elsevier Patient Education  2020 [employer].

## 2019-09-02 NOTE — Discharge Summary (Signed)
[facility] NAME: Austin Patterson    MR#:  [id number]  DATE OF BIRTH:  1956-07-13  DATE OF ADMISSION:  08/30/2019 ADMITTING PHYSICIAN: Flora Lipps, MD  DATE OF DISCHARGE: 09/02/2019 12:34 PM  PRIMARY CARE PHYSICIAN: Lavonne Chick, MD    ADMISSION DIAGNOSIS:  Dyspnea on exertion [R06.00] IPF (idiopathic pulmonary fibrosis) ([facility]) [J84.112] Community acquired pneumonia, unspecified laterality [J18.9]  DISCHARGE DIAGNOSIS:  Principal Problem:   Multifocal pneumonia Active Problems:   Hyponatremia   Reflux esophagitis   IPF (idiopathic pulmonary fibrosis) ([facility])   RUQ pain   Essential hypertension   Acute on chronic respiratory failure with hypoxia ([facility])   SECONDARY DIAGNOSIS:   Past Medical History:  Diagnosis Date  . COPD (chronic obstructive pulmonary disease) ([facility])   . Hyperlipidemia   . Hypertension   . Pulmonary fibrosis ([facility]) 11/2015    HOSPITAL COURSE:   1.  Multifocal pneumonia.  Right lower lobe and left upper lobe, community-acquired and failed outpatient treatment. Procalcitonin negative x2.  Patient did not want to stop antibiotics I will finish up a short course of Augmentin and Zithromax. 2.  Interstitial pulmonary fibrosis and COPD exacerbation.  We gave IV steroids here in the hospital.  I will give a longer course of steroids upon going home and continue 5 mg daily.  The patient was on chronic steroids as outpatient and felt better while he was on them.  He will follow-up with pulmonary next month.  And likely will have a slow taper over time of the steroids.  Continue inhalers and nebulizer treatments. 3.  Acute on chronic hypoxic respiratory failure.  Patient also had tachycardia and drop in pulse ox with ambulation.  Upon going home his heart rate was better with ambulation.  Patient already on metoprolol.  The patient chronically wears 2 L of oxygen and will now wear 4 L of oxygen with  ambulation. 4.  Essential hypertension on metoprolol, amlodipine.  Hydrochlorothiazide was stopped secondary to hyponatremia. 5.  Right upper quadrant pain.  This has resolved.  Ultrasound was negative for gallbladder disease. 6.  History of CAD on aspirin, Plavix and statin.  Patient also on Toprol-XL. 7.  Hyperlipidemia on atorvastatin 8.  GERD on Protonix  DISCHARGE CONDITIONS:   Satisfactory  CONSULTS OBTAINED:  None  DRUG ALLERGIES:  No Known Allergies  DISCHARGE MEDICATIONS:   Allergies as of 09/02/2019   No Known Allergies     Medication List    STOP taking these medications   losartan-hydrochlorothiazide 100-12.5 MG tablet Commonly known as: HYZAAR     TAKE these medications   albuterol 108 (90 Base) MCG/ACT inhaler Commonly known as: VENTOLIN HFA Inhale 2 puffs into the lungs every 6 (six) hours as needed for wheezing or shortness of breath.   amLODipine 10 MG tablet Commonly known as: NORVASC Take 1 tablet (10 mg total) by mouth daily.   amoxicillin-clavulanate 875-125 MG tablet Commonly known as: AUGMENTIN Take 1 tablet by mouth every 12 (twelve) hours for 5 doses.   aspirin EC 81 MG tablet Take 1 tablet (81 mg total) by mouth daily.   atorvastatin 40 MG tablet Commonly known as: LIPITOR Take 1 tablet (40 mg total) by mouth at bedtime.   azithromycin 250 MG tablet Commonly known as: ZITHROMAX One tab daily for two days Start taking on: September 03, 2019   calcium-vitamin D 500-200 MG-UNIT tablet Commonly known as: OSCAL WITH D Take 2 tablets by mouth daily  with breakfast.   cetirizine 10 MG tablet Commonly known as: ZYRTEC Take 10 mg by mouth at bedtime.   clopidogrel 75 MG tablet Commonly known as: PLAVIX Take 1 tablet (75 mg total) by mouth daily.   Fluticasone-Salmeterol 250-50 MCG/DOSE Aepb Commonly known as: Advair Diskus Inhale 1 puff into the lungs 2 (two) times daily.   ipratropium-albuterol 0.5-2.5 (3) MG/3ML Soln Commonly  known as: DUONEB Take 3 mLs by nebulization 4 (four) times daily as needed (wheezing/shortness of breath).   magnesium oxide 400 (241.3 Mg) MG tablet Commonly known as: MAG-OX Take 1 tablet (400 mg total) by mouth daily.   metoprolol succinate 50 MG 24 hr tablet Commonly known as: TOPROL-XL Take 1 tablet (50 mg total) by mouth daily.   multivitamin with minerals Tabs tablet Take 1 tablet by mouth daily.   pantoprazole 40 MG tablet Commonly known as: PROTONIX Take 1 tablet (40 mg total) by mouth 2 (two) times daily.   Potassium Chloride ER 20 MEQ Tbcr Take 20 mEq by mouth daily.   predniSONE 10 MG tablet Commonly known as: DELTASONE 4 tabs po day 1; 3 tabs po day2,3; 2 tabs po day4,5; 1 tab po day6,7,8; 1/2 tab po daily afterwards   Spiriva HandiHaler 18 MCG inhalation capsule Generic drug: tiotropium Place 1 capsule (18 mcg total) into inhaler and inhale daily.        DISCHARGE INSTRUCTIONS:   Follow-up PMD 5 days Follow-up Dr. Patsey Berthold pulmonary as already scheduled.  If you experience worsening of your admission symptoms, develop shortness of breath, life threatening emergency, suicidal or homicidal thoughts you must seek medical attention immediately by calling 911 or calling your MD immediately  if symptoms less severe.  You Must read complete instructions/literature along with all the possible adverse reactions/side effects for all the Medicines you take and that have been prescribed to you. Take any new Medicines after you have completely understood and accept all the possible adverse reactions/side effects.   Please note  You were cared for by a hospitalist during your hospital stay. If you have any questions about your discharge medications or the care you received while you were in the hospital after you are discharged, you can call the unit and asked to speak with the hospitalist on call if the hospitalist that took care of you is not available. Once you are  discharged, your primary care physician will handle any further medical issues. Please note that NO REFILLS for any discharge medications will be authorized once you are discharged, as it is imperative that you return to your primary care physician (or establish a relationship with a primary care physician if you do not have one) for your aftercare needs so that they can reassess your need for medications and monitor your lab values.    Today   CHIEF COMPLAINT:   Chief Complaint  Patient presents with  . Shortness of Breath    HISTORY OF PRESENT ILLNESS:  Austin Patterson  is a 63 y.o. male came in with shortness of breath   VITAL SIGNS:  Blood pressure (!) 151/76, pulse 89, temperature 98.4 F (36.9 C), temperature source Oral, resp. rate 18, height '5\' 7"'  (1.702 m), weight 92.5 kg, SpO2 100 %.  I/O:    Intake/Output Summary (Last 24 hours) at 09/02/2019 1623 Last data filed at 09/02/2019 0700 Gross per 24 hour  Intake 480 ml  Output 2050 ml  Net -1570 ml    PHYSICAL EXAMINATION:  GENERAL:  63 y.o.-year-old patient  lying in the bed with no acute distress.  EYES: Pupils equal, round, reactive to light and accommodation. No scleral icterus. Extraocular muscles intact.  HEENT: Head atraumatic, normocephalic. Oropharynx and nasopharynx clear.  NECK:  Supple, no jugular venous distention. No thyroid enlargement, no tenderness.  LUNGS: Decreased breath sounds bilaterally, no wheezing, rales,rhonchi or crepitation. No use of accessory muscles of respiration.  CARDIOVASCULAR: S1, S2 normal. No murmurs, rubs, or gallops.  ABDOMEN: Soft, non-tender, non-distended. Bowel sounds present. No organomegaly or mass.  EXTREMITIES: No pedal edema, cyanosis, or clubbing.  NEUROLOGIC: Cranial nerves II through XII are intact. Muscle strength 5/5 in all extremities. Sensation intact. Gait not checked.  PSYCHIATRIC: The patient is alert and oriented x 3.  SKIN: No obvious rash, lesion, or ulcer.    DATA REVIEW:   CBC Recent Labs  Lab 08/31/19 0433  WBC 14.1*  HGB 10.6*  HCT 31.7*  PLT 565*    Chemistries  Recent Labs  Lab 08/31/19 0433 09/01/19 0558  NA 131* 136  K 4.0 4.0  CL 97* 101  CO2 24 23  GLUCOSE 161* 156*  BUN 16 20  CREATININE 0.74 0.84  CALCIUM 9.3 9.1  AST 25  --   ALT 23  --   ALKPHOS 54  --   BILITOT 0.6  --      Microbiology Results  Results for orders placed or performed during the hospital encounter of 08/30/19  SARS CORONAVIRUS 2 (TAT 6-24 HRS) Nasopharyngeal Nasopharyngeal Swab     Status: None   Collection Time: 08/30/19 10:08 AM   Specimen: Nasopharyngeal Swab  Result Value Ref Range Status   SARS Coronavirus 2 NEGATIVE NEGATIVE Final    Comment: (NOTE) SARS-CoV-2 target nucleic acids are NOT DETECTED. The SARS-CoV-2 RNA is generally detectable in upper and lower respiratory specimens during the acute phase of infection. Negative results do not preclude SARS-CoV-2 infection, do not rule out co-infections with other pathogens, and should not be used as the sole basis for treatment or other patient management decisions. Negative results must be combined with clinical observations, patient history, and epidemiological information. The expected result is Negative. Fact Sheet for Patients: SugarRoll.be Fact Sheet for Healthcare Providers: https://www.woods-mathews.com/ This test is not yet approved or cleared by the [place] FDA and  has been authorized for detection and/or diagnosis of SARS-CoV-2 by FDA under an Emergency Use Authorization (EUA). This EUA will remain  in effect (meaning this test can be used) for the duration of the COVID-19 declaration under Section 56 4(b)(1) of the Act, 21 U.S.C. section 360bbb-3(b)(1), unless the authorization is terminated or revoked sooner. Performed at [facility], [place] [address] [place], [place] [address]   Culture, blood (Routine X  2) w Reflex to ID Panel     Status: None (Preliminary result)   Collection Time: 08/30/19 12:27 PM   Specimen: BLOOD  Result Value Ref Range Status   Specimen Description BLOOD RIGHT FA  Final   Special Requests   Final    BOTTLES DRAWN AEROBIC AND ANAEROBIC Blood Culture results may not be optimal due to an excessive volume of blood received in culture bottles   Culture   Final    NO GROWTH 3 DAYS Performed at [facility], [address], [place],  [address]    Report Status PENDING  Incomplete  Respiratory Panel by PCR     Status: None   Collection Time: 08/30/19 12:27 PM   Specimen: Flu Kit Nasopharyngeal Swab;  Respiratory  Result Value Ref Range Status   Adenovirus NOT DETECTED NOT DETECTED Final   Coronavirus 229E NOT DETECTED NOT DETECTED Final    Comment: (NOTE) The Coronavirus on the Respiratory Panel, DOES NOT test for the novel  Coronavirus (2019 nCoV)    Coronavirus HKU1 NOT DETECTED NOT DETECTED Final   Coronavirus NL63 NOT DETECTED NOT DETECTED Final   Coronavirus OC43 NOT DETECTED NOT DETECTED Final   Metapneumovirus NOT DETECTED NOT DETECTED Final   Rhinovirus / Enterovirus NOT DETECTED NOT DETECTED Final   Influenza A NOT DETECTED NOT DETECTED Final   Influenza B NOT DETECTED NOT DETECTED Final   Parainfluenza Virus 1 NOT DETECTED NOT DETECTED Final   Parainfluenza Virus 2 NOT DETECTED NOT DETECTED Final   Parainfluenza Virus 3 NOT DETECTED NOT DETECTED Final   Parainfluenza Virus 4 NOT DETECTED NOT DETECTED Final   Respiratory Syncytial Virus NOT DETECTED NOT DETECTED Final   Bordetella pertussis NOT DETECTED NOT DETECTED Final   Chlamydophila pneumoniae NOT DETECTED NOT DETECTED Final   Mycoplasma pneumoniae NOT DETECTED NOT DETECTED Final    Comment: Performed at [facility], [place] [address] [place], [place] [address]  Culture, blood (Routine X 2) w Reflex to ID Panel     Status: None (Preliminary result)   Collection Time: 08/30/19  12:28 PM   Specimen: BLOOD  Result Value Ref Range Status   Specimen Description BLOOD BLOOD LEFT HAND  Final   Special Requests   Final    BOTTLES DRAWN AEROBIC AND ANAEROBIC Blood Culture adequate volume   Culture   Final    NO GROWTH 3 DAYS Performed at [facility], [address] [place], [place] [address]    Report Status PENDING  Incomplete    RADIOLOGY:  US Abdomen Limited Ruq  Result Date: 08/31/2019 CLINICAL DATA:  63 year old male with right upper quadrant abdominal pain. EXAM: ULTRASOUND ABDOMEN LIMITED RIGHT UPPER QUADRANT COMPARISON:  Abdominal ultrasound dated 06/14/2018. FINDINGS: Gallbladder: No gallstones or wall thickening visualized. No sonographic Murphy sign noted by sonographer. Common bile duct: Diameter: 4 mm Liver: There is diffuse increased liver echogenicity most commonly seen in the setting of fatty infiltration. Superimposed inflammation or fibrosis is not excluded. Clinical correlation is recommended. Portal vein is patent on color Doppler imaging with normal direction of blood flow towards the liver. Other: None. IMPRESSION: Fatty liver, otherwise unremarkable right upper quadrant ultrasound. Electronically Signed   By: Anner Crete M.D.   On: 08/31/2019 16:41      Management plans discussed with the patient, and he in agreement.  CODE STATUS:  Code Status History    Date Active Date Inactive Code Status Order ID Comments User Context   08/30/2019 1428 09/02/2019 1539 Full Code [id number]  Flora Lipps, MD ED   06/17/2017 1814 06/18/2017 1901 Full Code [id number]  Nicholes Mango, MD Inpatient   03/31/2017 1924 04/01/2017 1426 Full Code [id number]  Fritzi Mandes, MD ED   09/21/2016 0914 09/23/2016 1902 Full Code [id number]  Fritzi Mandes, MD Inpatient   12/09/2015 1705 12/16/2015 1803 Full Code [id number]  Bettey Costa, MD Inpatient   10/14/2015 1342 10/16/2015 1629 Full Code [id number]  Nicholes Mango, MD Inpatient   Advance Care Planning Activity       TOTAL TIME TAKING CARE OF THIS PATIENT: 35 minutes.    Loletha Grayer M.D on 09/02/2019 at 4:23 PM  Between 7am to 6pm - Pager - [phone]  After 6pm go to [email or web address] - [place]  [facility]  CC: Primary care physician; Lavonne Chick, MD

## 2019-09-04 LAB — CULTURE, BLOOD (ROUTINE X 2)
Culture: NO GROWTH
Culture: NO GROWTH
Special Requests: ADEQUATE

## 2019-09-18 ENCOUNTER — Telehealth: Payer: Self-pay | Admitting: Internal Medicine

## 2019-09-18 NOTE — Telephone Encounter (Signed)
Spoke to [place], who is requesting update on zemaira update.  Per Dr. Patsey Berthold verbally- she can not sign forms until pt is seen in office on 10/05/2019. Medication will need to be discussed further with pt.  I have made pt aware of this information. Pt voiced his understanding and had no further questions.  Nothing further is needed.

## 2019-09-18 NOTE — Telephone Encounter (Signed)
Call returned to Austin Patterson, she has faxed over some orders for his zemaira refill. Confirmed [employer] number. She is going to re-fax forms. I made her aware Dr. Juanell Fairly is no longer with our clinic so another provider will need to sign. Voiced understanding. Will route message to [facility] to f/u.

## 2019-10-05 ENCOUNTER — Other Ambulatory Visit: Payer: Self-pay

## 2019-10-05 ENCOUNTER — Ambulatory Visit: Payer: Medicare HMO | Admitting: Pulmonary Disease

## 2019-10-05 ENCOUNTER — Other Ambulatory Visit
Admission: RE | Admit: 2019-10-05 | Discharge: 2019-10-05 | Disposition: A | Discharge status: Home / Self Care | Payer: Medicare HMO | Source: Ambulatory Visit | Attending: Pulmonary Disease | Admitting: Pulmonary Disease

## 2019-10-05 ENCOUNTER — Encounter: Payer: Self-pay | Admitting: Pulmonary Disease

## 2019-10-05 VITALS — BP 138/78 | HR 88 | Temp 97.3°F | Ht 67.0 in | Wt 214.0 lb

## 2019-10-05 DIAGNOSIS — J449 Chronic obstructive pulmonary disease, unspecified: Secondary | ICD-10-CM | POA: Diagnosis not present

## 2019-10-05 DIAGNOSIS — J9611 Chronic respiratory failure with hypoxia: Secondary | ICD-10-CM

## 2019-10-05 DIAGNOSIS — J849 Interstitial pulmonary disease, unspecified: Secondary | ICD-10-CM

## 2019-10-05 MED ORDER — AEROCHAMBER MV MISC: 0 refills | Status: DC

## 2019-10-05 MED ORDER — BREZTRI AEROSPHERE 160-9-4.8 MCG/ACT IN AERO: 2.0000 | INHALATION_SPRAY | Freq: Two times a day (BID) | RESPIRATORY_TRACT | 0 refills | Status: DC

## 2019-10-05 NOTE — Progress Notes (Signed)
Subjective:    Patient ID: Austin Patterson, male    DOB: 1955-11-07, 63 y.o.   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  The patient previously followed with Dr. Ashby Dawes and I am assuming care.  Seen by Dr. Ashby Dawes on 04/26/2019, 63.  It appears that he had been on alpha-1 infusions (he has normal alpha-1 levels and MZ phenotype) despite this he has had exacerbations though he seems to think that these were working.  However as noted his levels were normal.  Apparently also he was tried on Esbriet however his pulmonary fibrosis is not classic for IPF.  He did not tolerate Esbriet.  Noted on his last appointment with Dr. Ashby Dawes that he felt he was declining in function.  He has had no fevers, chills or sweats.  No chest pain, orthopnea or paroxysmal nocturnal dyspnea.  He uses oxygen at 3 L/min for chronic hypoxic respiratory failure.  This time last year he was using approximately 2 L/min.  He is performed 04/20/2019 showed an FEV1 of 1.23 L or 39% predicted with a 12% change postbronchodilator there was a significant air trapping and diffusion capacity was 55%.  This showed significant decline from his function in April 2017.  CT scans of the chest have shown fibrosis consistent with hypersensitivity pneumonitis and/or postinflammatory/infectious etiology.  He has severe bullous disease that has worsened significantly over the course of the years.  Review of Systems  A 10 point review of systems was performed and it is as noted above otherwise negative.  Medication list reviewed   Objective:   Physical Exam BP 138/78 (BP Location: Left Arm, Cuff Size: Normal)   Pulse 88   Temp (!) 97.3 F  (36.3 C) (Temporal)   Ht 5\' 7"  (1.702 m)   Wt 214 lb (97.1 kg)   SpO2 96%   BMI 33.52 kg/m   GENERAL: This is a overweight gentleman, he looks chronically ill.  Chronic use of accessories.  On nasal cannula O2.  Ambulatory with assistance of a cane. HEAD: Normocephalic, atraumatic.  EYES: Pupils equal, round, reactive to light.  No scleral icterus.  MOUTH: Nose/mouth/throat not examined due to masking requirements for COVID 19. NECK: Supple. No thyromegaly. Trachea midline. No JVD.  No adenopathy. PULMONARY: Increased AP diameter, significant kyphosis, distant breath sounds, coarse. CARDIOVASCULAR: S1 and S2. Regular rate and rhythm.  Distant heart tones, no murmurs appreciated. GASTROINTESTINAL: Benign. MUSCULOSKELETAL: No joint deformity, no clubbing, no edema.  Significant kyphosis noted. NEUROLOGIC: No focal deficit.  Awake, alert, speech is fluent SKIN: Intact,warm,dry.  No overt rashes. PSYCH: Mood appears irascible, behavior normal.      Assessment & Plan:     ICD-10-CM   1. Asthma-COPD overlap syndrome ([place])  J44.9   2. Chronic respiratory failure with hypoxia (HCC)  J96.11   3. Interstitial lung disease (HCC)  J84.9 Hypersensitivity Pneumonitis    IgE    IgG, IgA, IgM/Quant Immunoglobulin Panel    Alpha-1-antitrypsin    CANCELED: Alpha-1-antitrypsin    CANCELED: IgG, IgA, IgM/Quant Immunoglobulin Panel    CANCELED: IgE    CANCELED: Hypersensitivity Pneumonitis   Meds ordered this encounter  Medications  . Spacer/Aero-Holding Chambers (AEROCHAMBER MV) inhaler    Sig: Use as instructed    Dispense:  1 each  Refill:  0  . DISCONTD: Budeson-Glycopyrrol-Formoterol (BREZTRI AEROSPHERE) 160-9-4.8 MCG/ACT AERO    Sig: Inhale 2 puffs into the lungs 2 (two) times daily.    Dispense:  10.7 g    Refill:  0    Order Specific Question:   Lot Number?    Answer:   [id number]    Order Specific Question:   Expiration Date?    Answer:   04/18/2021    Order Specific Question:    Quantity    Answer:   2    Discussion: Very complex gentleman with chronic obstructive pulmonary disease and a component of asthma overlap.  His fibrosis pattern on CT is consistent with either hypersensitivity pneumonitis and/or postinflammatory fibrosis.  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  Though a small percentage of MZ patients do develop deficiency the patient has not demonstrated alpha levels below 57 L.  All of his levels have been above this.  It appears that there was consideration of discontinuing this therapy as he had continued to deteriorate despite alpha-1 augmentation.  Recommend obtaining hypersensitivity pneumonitis panel, IgE, immunoglobulin panel, repeat alpha-1 level and reassess.  I suspect that he will need a biologic for asthma control rather than alpha-1 augmentation therapy that he does not appear to qualify for at this point.  We will give him a trial of Breztri 2 inhalations twice a day replace Advair and Spiriva.  We provided him with spacer for the use of the [facility].  We will see him in follow-up in 3 weeks time to follow-up on the results of his blood work.  He is to contact us prior to that time should any new difficulties arise.   Renold Don, MD [facility]  *This note was dictated using voice recognition software/Dragon.  Despite best efforts to proofread, errors can occur which can change the meaning.  Any change was purely unintentional.

## 2019-10-05 NOTE — Patient Instructions (Signed)
1.  We are rechecking the levels of alpha-1 and immunoglobulins in your blood.  2.  We will give you a trial of Breztri 2 inhalations twice a day with a spacer.  3.  Do not use Advair or Spiriva when taking Breztri.  You may still use your albuterol nebulizer if needed.  4.  We will see you in follow-up in 3 weeks time we should have the results of your blood work by then and will discuss further management plan.

## 2019-10-06 LAB — ALPHA-1-ANTITRYPSIN: A-1 Antitrypsin, Ser: 116 mg/dL (ref 101–187)

## 2019-10-06 LAB — IGG, IGA, IGM
IgA: 230 mg/dL (ref 61–437)
IgG (Immunoglobin G), Serum: 677 mg/dL (ref 603–1613)
IgM (Immunoglobulin M), Srm: 152 mg/dL (ref 20–172)

## 2019-10-07 LAB — IGE: IgE (Immunoglobulin E), Serum: 1950 IU/mL — ABNORMAL HIGH (ref 6–495)

## 2019-10-09 LAB — HYPERSENSITIVITY PNEUMONITIS
A. Pullulans Abs: NEGATIVE
A.Fumigatus #1 Abs: POSITIVE — AB
Micropolyspora faeni, IgG: NEGATIVE
Pigeon Serum Abs: NEGATIVE
Thermoact. Saccharii: NEGATIVE
Thermoactinomyces vulgaris, IgG: NEGATIVE

## 2019-10-25 ENCOUNTER — Ambulatory Visit: Payer: Medicare HMO | Attending: Internal Medicine

## 2019-10-25 DIAGNOSIS — Z20822 Contact with and (suspected) exposure to covid-19: Secondary | ICD-10-CM

## 2019-10-26 ENCOUNTER — Telehealth: Payer: Self-pay | Admitting: Pulmonary Disease

## 2019-10-26 LAB — NOVEL CORONAVIRUS, NAA: SARS-CoV-2, NAA: NOT DETECTED

## 2019-10-26 NOTE — Telephone Encounter (Signed)
Will route to [place], as she contacted pt.

## 2019-10-29 ENCOUNTER — Emergency Department
Admission: EM | Admit: 2019-10-29 | Discharge: 2019-10-29 | Disposition: A | Discharge status: Home / Self Care | Payer: Medicare HMO | Attending: Emergency Medicine | Admitting: Emergency Medicine

## 2019-10-29 ENCOUNTER — Emergency Department: Payer: Medicare HMO

## 2019-10-29 ENCOUNTER — Other Ambulatory Visit: Payer: Self-pay

## 2019-10-29 DIAGNOSIS — R1010 Upper abdominal pain, unspecified: Secondary | ICD-10-CM | POA: Diagnosis not present

## 2019-10-29 DIAGNOSIS — R0789 Other chest pain: Secondary | ICD-10-CM

## 2019-10-29 DIAGNOSIS — Z87891 Personal history of nicotine dependence: Secondary | ICD-10-CM | POA: Insufficient documentation

## 2019-10-29 DIAGNOSIS — J449 Chronic obstructive pulmonary disease, unspecified: Secondary | ICD-10-CM | POA: Insufficient documentation

## 2019-10-29 DIAGNOSIS — R079 Chest pain, unspecified: Secondary | ICD-10-CM | POA: Diagnosis present

## 2019-10-29 DIAGNOSIS — Z7901 Long term (current) use of anticoagulants: Secondary | ICD-10-CM | POA: Diagnosis not present

## 2019-10-29 DIAGNOSIS — Z20822 Contact with and (suspected) exposure to covid-19: Secondary | ICD-10-CM | POA: Diagnosis not present

## 2019-10-29 DIAGNOSIS — E871 Hypo-osmolality and hyponatremia: Secondary | ICD-10-CM | POA: Insufficient documentation

## 2019-10-29 DIAGNOSIS — Z7982 Long term (current) use of aspirin: Secondary | ICD-10-CM | POA: Insufficient documentation

## 2019-10-29 DIAGNOSIS — Z79899 Other long term (current) drug therapy: Secondary | ICD-10-CM | POA: Diagnosis not present

## 2019-10-29 DIAGNOSIS — I10 Essential (primary) hypertension: Secondary | ICD-10-CM | POA: Insufficient documentation

## 2019-10-29 DIAGNOSIS — R1013 Epigastric pain: Secondary | ICD-10-CM

## 2019-10-29 LAB — HEPATIC FUNCTION PANEL
ALT: 23 U/L (ref 0–44)
AST: 40 U/L (ref 15–41)
Albumin: 4.4 g/dL (ref 3.5–5.0)
Alkaline Phosphatase: 58 U/L (ref 38–126)
Bilirubin, Direct: 0.2 mg/dL (ref 0.0–0.2)
Indirect Bilirubin: 0.9 mg/dL (ref 0.3–0.9)
Total Bilirubin: 1.1 mg/dL (ref 0.3–1.2)
Total Protein: 7.6 g/dL (ref 6.5–8.1)

## 2019-10-29 LAB — CBC
HCT: 34.5 % — ABNORMAL LOW (ref 39.0–52.0)
Hemoglobin: 11.9 g/dL — ABNORMAL LOW (ref 13.0–17.0)
MCH: 30.8 pg (ref 26.0–34.0)
MCHC: 34.5 g/dL (ref 30.0–36.0)
MCV: 89.4 fL (ref 80.0–100.0)
Platelets: 294 10*3/uL (ref 150–400)
RBC: 3.86 MIL/uL — ABNORMAL LOW (ref 4.22–5.81)
RDW: 17.1 % — ABNORMAL HIGH (ref 11.5–15.5)
WBC: 7.7 10*3/uL (ref 4.0–10.5)
nRBC: 0 % (ref 0.0–0.2)

## 2019-10-29 LAB — BASIC METABOLIC PANEL
Anion gap: 16 — ABNORMAL HIGH (ref 5–15)
BUN: 16 mg/dL (ref 8–23)
CO2: 20 mmol/L — ABNORMAL LOW (ref 22–32)
Calcium: 9.1 mg/dL (ref 8.9–10.3)
Chloride: 89 mmol/L — ABNORMAL LOW (ref 98–111)
Creatinine, Ser: 0.98 mg/dL (ref 0.61–1.24)
GFR calc Af Amer: >60 mL/min (ref >60)
GFR calc non Af Amer: >60 mL/min (ref >60)
Glucose, Bld: 114 mg/dL — ABNORMAL HIGH (ref 70–99)
Potassium: 3.7 mmol/L (ref 3.5–5.1)
Sodium: 125 mmol/L — ABNORMAL LOW (ref 135–145)

## 2019-10-29 LAB — TROPONIN I (HIGH SENSITIVITY)
Troponin I (High Sensitivity): 7 ng/L (ref <18)
Troponin I (High Sensitivity): 7 ng/L (ref <18)

## 2019-10-29 LAB — LIPASE, BLOOD: Lipase: 34 U/L (ref 11–51)

## 2019-10-29 LAB — SARS CORONAVIRUS 2 (TAT 6-24 HRS): SARS Coronavirus 2: NEGATIVE

## 2019-10-29 MED ORDER — MORPHINE SULFATE (PF) 4 MG/ML IV SOLN
4.0000 mg | Freq: Once | INTRAVENOUS | Status: AC
  Administered 2019-10-29: 4 mg via INTRAVENOUS
  Filled 2019-10-29: qty 1

## 2019-10-29 MED ORDER — SODIUM CHLORIDE 0.9 % IV BOLUS
1000.0000 mL | Freq: Once | INTRAVENOUS | Status: AC
  Administered 2019-10-29: 09:00:00 1000 mL via INTRAVENOUS

## 2019-10-29 MED ORDER — ONDANSETRON HCL 4 MG/2ML IJ SOLN
4.0000 mg | Freq: Once | INTRAMUSCULAR | Status: AC
  Administered 2019-10-29: 4 mg via INTRAVENOUS
  Filled 2019-10-29: qty 2

## 2019-10-29 NOTE — ED Triage Notes (Signed)
Patient to ED via EMS from home with complaint of chest pain/pressure for 2-3 days.  Patient with history of pulmonary fibrosis.  Recently stopped a trial medication for fibrosis.

## 2019-10-29 NOTE — ED Notes (Signed)
Per EMS patient complains of chest pain/pressure for 2-3 days.  EMS vitals -  BP   166/82,  hr95, puls oxi 95% on O2 @ 2l, saline loc via 20g angiocath to left hand.

## 2019-10-29 NOTE — ED Provider Notes (Signed)
[facility] Emergency Department Provider Note  Time seen: 8:40 AM  I have reviewed the triage vital signs and the nursing notes.   HISTORY  Chief Complaint Chest Pain   HPI Austin Patterson is a 64 y.o. male with a past medical history of COPD, pulmonary fibrosis, hypertension, hyperlipidemia on 2 L of oxygen 24/7, presents to the emergency department with various complaints of low sodium upper abdominal pain chest pains.  According to the patient for the past several days he has been experiencing intermittent pain in his chest as well as upper abdomen.  Patient states a history of upper abdominal pain that comes and goes.  He was also told by his doctor last week that his sodium level was low.  In reviewing the patient's chart he has a history of hyponatremia thought to be due in part to alcohol use.  Patient admits to continued alcohol use, I did discuss with the patient the need to significantly reduce or abstain from alcohol as this can cause the salt levels to drop.  Patient states mild to moderate upper abdominal pain but states this is fairly chronic comes and goes per patient.  Denies any chest pain at this time.  Does state he has had a runny nose over the past several days but no known fever.  Past Medical History:  Diagnosis Date  . COPD (chronic obstructive pulmonary disease) ([place])   . Hyperlipidemia   . Hypertension   . Pulmonary fibrosis ([place]) 11/2015    Patient Active Problem List   Diagnosis Date Noted  . Acute on chronic respiratory failure with hypoxia ([place])   . RUQ pain   . Essential hypertension   . IPF (idiopathic pulmonary fibrosis) ([place]) 08/30/2019  . Hypomagnesemia 03/31/2017  . Hiatal hernia   . Reflux esophagitis   . Hematemesis without nausea   . Hyponatremia 09/21/2016  . Multifocal pneumonia 12/09/2015  . COPD with acute exacerbation ([place]) 10/14/2015    Past Surgical History:  Procedure Laterality Date  . CORONARY STENT  PLACEMENT    . ESOPHAGOGASTRODUODENOSCOPY (EGD) WITH PROPOFOL N/A 09/23/2016   Procedure: ESOPHAGOGASTRODUODENOSCOPY (EGD) WITH PROPOFOL;  Surgeon: [name], MD;  Location: [facility] ENDOSCOPY;  Service: Endoscopy;  Laterality: N/A;  . SHOULDER ACROMIOPLASTY      Prior to Admission medications   Medication Sig Start Date End Date Taking? Authorizing Provider  albuterol (PROVENTIL HFA;VENTOLIN HFA) 108 (90 Base) MCG/ACT inhaler Inhale 2 puffs into the lungs every 6 (six) hours as needed for wheezing or shortness of breath. 01/29/17   Darel Hong, MD  amLODipine (NORVASC) 10 MG tablet Take 1 tablet (10 mg total) by mouth daily. 08/19/16   Tawni Millers, MD  aspirin EC 81 MG tablet Take 1 tablet (81 mg total) by mouth daily. 08/19/16   Tawni Millers, MD  atorvastatin (LIPITOR) 40 MG tablet Take 1 tablet (40 mg total) by mouth at bedtime. 08/19/16   Tawni Millers, MD  azithromycin (ZITHROMAX) 250 MG tablet One tab daily for two days 09/03/19   Loletha Grayer, MD  Budeson-Glycopyrrol-Formoterol (BREZTRI AEROSPHERE) 160-9-4.8 MCG/ACT AERO Inhale 2 puffs into the lungs 2 (two) times daily. 10/05/19   Tyler Pita, MD  calcium-vitamin D (OSCAL WITH D) 500-200 MG-UNIT tablet Take 2 tablets by mouth daily with breakfast. 09/24/16   Fritzi Mandes, MD  cetirizine (ZYRTEC) 10 MG tablet Take 10 mg by mouth at bedtime. 08/19/16   [provider]  clopidogrel (PLAVIX) 75 MG tablet Take  1 tablet (75 mg total) by mouth daily. 08/19/16   Tawni Millers, MD  ipratropium-albuterol (DUONEB) 0.5-2.5 (3) MG/3ML SOLN Take 3 mLs by nebulization 4 (four) times daily as needed (wheezing/shortness of breath).  08/08/19   [provider]  magnesium oxide (MAG-OX) 400 (241.3 Mg) MG tablet Take 1 tablet (400 mg total) by mouth daily. 06/18/17   Hillary Bow, MD  metoprolol succinate (TOPROL-XL) 50 MG 24 hr tablet Take 1 tablet (50 mg total) by mouth daily. 08/19/16   Tawni Millers, MD  Multiple Vitamin  (MULTIVITAMIN WITH MINERALS) TABS tablet Take 1 tablet by mouth daily. 09/24/16   Fritzi Mandes, MD  pantoprazole (PROTONIX) 40 MG tablet Take 1 tablet (40 mg total) by mouth 2 (two) times daily. 09/23/16   Fritzi Mandes, MD  potassium chloride 20 MEQ TBCR Take 20 mEq by mouth daily. 06/18/17   Hillary Bow, MD  Spacer/Aero-Holding Chambers (AEROCHAMBER MV) inhaler Use as instructed 10/05/19   Tyler Pita, MD    No Known Allergies  Family History  Problem Relation Age of Onset  . Heart disease Mother     Social History Social History   Tobacco Use  . Smoking status: Former Smoker    Packs/day: 2.00    Years: 50.00    Pack years: 100.00    Types: Cigarettes    Quit date: 10/14/2015    Years since quitting: 4.0  . Smokeless tobacco: Former Network engineer Use Topics  . Alcohol use: Yes    Alcohol/week: 28.0 standard drinks    Types: 28 Cans of beer per week    Comment: 6 12 oz cans of beer/day  . Drug use: No    Review of Systems Constitutional: Negative for fever. ENT: Positive for runny nose Cardiovascular: Negative for chest pain. Respiratory: Positive for shortness of breath.  Positive for occasional cough. Gastrointestinal: Negative for abdominal pain, vomiting  Musculoskeletal: Negative for musculoskeletal complaints Neurological: Negative for headache All other ROS negative  ____________________________________________   PHYSICAL EXAM:  VITAL SIGNS: ED Triage Vitals  Enc Vitals Group     BP 10/29/19 0352 (!) 162/88     Pulse Rate 10/29/19 0352 (!) 105     Resp 10/29/19 0352 (!) 22     Temp 10/29/19 0352 98.7 F (37.1 C)     Temp Source 10/29/19 0352 Oral     SpO2 10/29/19 0352 95 %     Weight 10/29/19 0350 210 lb (95.3 kg)     Height 10/29/19 0350 5\' 7"  (1.702 m)     Head Circumference --      Peak Flow --      Pain Score 10/29/19 0349 9     Pain Loc --      Pain Edu? --      Excl. in [place]? --    Constitutional: Alert and oriented. Well  appearing and in no distress. Eyes: Normal exam ENT      Head: Normocephalic and atraumatic.      Mouth/Throat: Mucous membranes are moist. Cardiovascular: Normal rate, regular rhythm Respiratory: Normal respiratory effort without tachypnea nor retractions. Breath sounds are clear  Gastrointestinal: Soft, mild epigastric tenderness to palpation otherwise benign abdomen without rebound guarding or distention. Musculoskeletal: Nontender with normal range of motion in all extremities.  Neurologic:  Normal speech and language. No gross focal neurologic deficits  Skin:  Skin is warm, dry and intact.  Psychiatric: Mood and affect are normal.  ____________________________________________    EKG  EKG viewed and interpreted by myself shows a normal sinus rhythm at 98 bpm with a narrow QRS, normal axis, normal intervals, no concerning ST changes.  ____________________________________________    RADIOLOGY  Chest x-ray shows no acute findings.  ____________________________________________   INITIAL IMPRESSION / ASSESSMENT AND PLAN / ED COURSE  Pertinent labs & imaging results that were available during my care of the patient were reviewed by me and considered in my medical decision making (see chart for details).   Patient presents to the emergency department for intermittent chest pains upper abdominal pains low-sodium runny nose.  Overall the patient appears well, no acute distress mild epigastric tenderness on exam but states this is fairly chronic for him.  We will check a hepatic function panel as well as lipase as precaution.  Patient does have hyponatremia although in reviewing the patient's records his sodium does run low at times.  Patient does admit to continued alcohol use, I did discuss with the patient attempting to limit or abstain from alcohol.  The remainder of the patient's lab work is largely nonrevealing besides mild dehydration.  Given what appears to be mild dehydration  along with hyponatremia we will dose normal saline.  Currently awaiting hepatic function panel and lipase.  Patient is requesting a swab for coronavirus given his runny nose over the last several days which I believe is reasonable.  Given 2 - heart enzymes reassuring EKG and chest x-ray I do believe the patient would ultimately be safe for discharge home given his mild to moderate hyponatremia appears to be largely chronic.  Patient has finished his liter of fluids.  Continues to appear well.  We will discharge from the emergency department with PCP follow-up.  Austin Patterson was evaluated in Emergency Department on 10/29/2019 for the symptoms described in the history of present illness. He was evaluated in the context of the global COVID-19 pandemic, which necessitated consideration that the patient might be at risk for infection with the SARS-CoV-2 virus that causes COVID-19. Institutional protocols and algorithms that pertain to the evaluation of patients at risk for COVID-19 are in a state of rapid change based on information released by regulatory bodies including the CDC and federal and state organizations. These policies and algorithms were followed during the patient's care in the ED.  ____________________________________________   FINAL CLINICAL IMPRESSION(S) / ED DIAGNOSES  Hyponatremia Chest pain Abdominal pain   Harvest Dark, MD 10/29/19 1000

## 2019-10-29 NOTE — ED Notes (Signed)
Pt c/o of generalized abd pain and bloating feeling. Hx pulmonary fibrosis. On 2 L [place] chronic. Recently changed pulmonary MD's. A&O, ambulatory to toilet.

## 2019-10-29 NOTE — Discharge Instructions (Signed)
As we discussed please limit your alcohol intake and avoid excessive water intake.  Please eat a sufficient amount of calories per day 1800 to 2000 cal daily.  Please follow-up with your doctor within the next 2 to 3 days for recheck of your labs including sodium level.  Return to the emergency department for any symptoms personally concerning to yourself.

## 2019-10-30 ENCOUNTER — Ambulatory Visit (INDEPENDENT_AMBULATORY_CARE_PROVIDER_SITE_OTHER): Payer: Medicare HMO | Admitting: Pulmonary Disease

## 2019-10-30 DIAGNOSIS — J438 Other emphysema: Secondary | ICD-10-CM

## 2019-10-30 DIAGNOSIS — J4550 Severe persistent asthma, uncomplicated: Secondary | ICD-10-CM | POA: Diagnosis not present

## 2019-10-30 DIAGNOSIS — J8410 Pulmonary fibrosis, unspecified: Secondary | ICD-10-CM | POA: Diagnosis not present

## 2019-10-30 DIAGNOSIS — J849 Interstitial pulmonary disease, unspecified: Secondary | ICD-10-CM

## 2019-10-30 DIAGNOSIS — B4481 Allergic bronchopulmonary aspergillosis: Secondary | ICD-10-CM

## 2019-10-30 MED ORDER — FLUTICASONE-SALMETEROL 250-50 MCG/DOSE IN AEPB: 1.0000 | INHALATION_SPRAY | Freq: Two times a day (BID) | RESPIRATORY_TRACT | 5 refills | Status: DC

## 2019-10-30 MED ORDER — PREDNISONE 10 MG PO TABS: 10.0000 mg | ORAL_TABLET | Freq: Every day | ORAL | 0 refills | Status: DC

## 2019-10-30 NOTE — Patient Instructions (Addendum)
1. We will recollect alpha-1 levels.  I will also obtain an IgE directed to Aspergillus specifically your situation is ABPA/asthma.  2.  We have initiated paperwork for Xolair is the medication that it is indicated for your condition.  3.  I have discontinued Breztri since you feel it is not helping you and causing your legs to swell.  4.  We will resume Advair 250/50 1 inhalation twice a day, continue albuterol inhaler as needed for rescue.  You may also use DuoNeb nebulizer as needed.  5.  I will start you on prednisone at 10 mg daily, to see if this will help with your symptoms.  You will only get 10 to 14 days worth of this.

## 2019-10-30 NOTE — Progress Notes (Signed)
Subjective:    Patient ID: Austin Patterson, male    DOB: November 28, 1955, 64 y.o.   MRN: [id number]  Virtual Visit Via Video or Telephone Note:   This visit type was conducted due to national recommendations for restrictions regarding the COVID-19 pandemic .  This format is felt to be most appropriate for this patient at this time.  All issues noted in this document were discussed and addressed.  No physical exam was performed (except for noted visual exam findings with Video Visits).    I connected with 10/30/2019 by telephone at 10 AM and verified that I was speaking with the correct person using two identifiers. Location patient: home Location provider: [facility] Persons participating in the virtual visit: patient, physician   I discussed the limitations, risks, security and privacy concerns of performing an evaluation and management service by video and the availability of in person appointments. The patient expressed understanding and agreed to proceed.  HPI This is a 64 year old former smoker (quit 2016) with 100-pack-year history of smoking whom we are connecting with via telephone for discussion of labs obtained on last visit.  He has asthma COPD overlap syndrome and newly noted elevated IgE at 1,950 also had a hypersensitivity pneumonitis panel and this showed positivity to Aspergillus fumigatus.  The exact nadir of the IgE to Aspergillus is unknown.  The patient states today that Judithann Sauger made his legs swell and he stopped the medication.  He would like to resume his Advair and Spiriva from prior.  The patient is on oxygen at 3 L/min.  I discussed all of the laboratory data with him.  He still insists that he needs to go back on alpha-1 infusions though qualify for these.  I explained to him the issues that are occurring with his lung disease that is very complex he does have emphysema, postinflammatory fibrosis/hypersensitivity pneumonitis and sensitivity to Aspergillus  with an elevated IgE.  This is highly suspicious for ABPA.  We discussed treating with a biologic for his asthma component.  In particular Xolair.  He is somewhat disgruntled about this and does not wish to go to [facility] to get the infusions.  I advised him to consider and to give this a try as I think it will help the asthmatic component of his lung disease and hopefully reduce his exacerbations.  Has not had any fevers, chills or sweats.  He voices no other complaint.   Review of Systems A 10 point review of systems was performed and it is as noted above otherwise negative.    Objective:   Physical Exam There were no vitals taken for this visit. Physical exam No physical exam performed as the visit was via telephone.  Patient did not exhibit conversational dyspnea during the visit.     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 Other emphysema ([place])  J43.8    Meds ordered this encounter  Medications  . DISCONTD: Fluticasone-Salmeterol (ADVAIR DISKUS) 250-50 MCG/DOSE AEPB    Sig: Inhale 1 puff into the lungs 2 (two) times daily.    Dispense:  30 each    Refill:  5  . DISCONTD: predniSONE (DELTASONE) 10 MG tablet    Sig: Take  1 tablet (10 mg total) by mouth daily with breakfast.    Dispense:  14 tablet    Refill:  0  . DISCONTD: Fluticasone-Salmeterol (ADVAIR DISKUS) 250-50 MCG/DOSE AEPB    Sig: Inhale 1 puff into the lungs 2 (two) times daily.    Dispense:  30 each    Refill:  5  . DISCONTD: predniSONE (DELTASONE) 10 MG tablet    Sig: Take 1 tablet (10 mg total) by mouth daily with breakfast.    Dispense:  14 tablet    Refill:  0   Discussion: We will resume the patient's Advair 250/50 1  inhalation twice a day as well as the albuterol inhaler as needed for rescue.  He is also to use DuoNeb nebulizer as needed.  He is currently not taking Spiriva so we will discontinue this.  He did not tolerate Breztri citing that it caused his legs to swell.  We will start prednisone 10 mg daily until Xolair is approved.  Paperwork for Xolair has been submitted.  We will obtain an IgE directed to Aspergillus specifically.  Follow-up in 2-4 weeks time.  Total visit time: 14 minutes  C. Derrill Kay, MD [facility]  *This note was dictated using voice recognition software/Dragon.  Despite best efforts to proofread, errors can occur which can change the meaning.  Any change was purely unintentional.

## 2019-10-31 ENCOUNTER — Telehealth: Payer: Self-pay | Admitting: Pulmonary Disease

## 2019-10-31 MED ORDER — FLUTICASONE-SALMETEROL 250-50 MCG/DOSE IN AEPB: 1.0000 | INHALATION_SPRAY | Freq: Two times a day (BID) | RESPIRATORY_TRACT | 5 refills | Status: DC

## 2019-10-31 NOTE — Telephone Encounter (Addendum)
Spoke to [facility] and verified quantity. Nothing further is needed.

## 2019-11-01 ENCOUNTER — Other Ambulatory Visit: Payer: Self-pay

## 2019-11-01 ENCOUNTER — Observation Stay
Admission: EM | Admit: 2019-11-01 | Discharge: 2019-11-03 | Disposition: A | Discharge status: Home Health | Payer: Medicare HMO | Attending: Internal Medicine | Admitting: Internal Medicine

## 2019-11-01 ENCOUNTER — Encounter: Payer: Self-pay | Admitting: Emergency Medicine

## 2019-11-01 ENCOUNTER — Emergency Department: Payer: Medicare HMO

## 2019-11-01 DIAGNOSIS — I959 Hypotension, unspecified: Secondary | ICD-10-CM

## 2019-11-01 DIAGNOSIS — T360X5A Adverse effect of penicillins, initial encounter: Secondary | ICD-10-CM | POA: Diagnosis not present

## 2019-11-01 DIAGNOSIS — J441 Chronic obstructive pulmonary disease with (acute) exacerbation: Secondary | ICD-10-CM | POA: Insufficient documentation

## 2019-11-01 DIAGNOSIS — T782XXA Anaphylactic shock, unspecified, initial encounter: Secondary | ICD-10-CM

## 2019-11-01 DIAGNOSIS — K219 Gastro-esophageal reflux disease without esophagitis: Secondary | ICD-10-CM | POA: Insufficient documentation

## 2019-11-01 DIAGNOSIS — X58XXXA Exposure to other specified factors, initial encounter: Secondary | ICD-10-CM | POA: Insufficient documentation

## 2019-11-01 DIAGNOSIS — R739 Hyperglycemia, unspecified: Secondary | ICD-10-CM | POA: Diagnosis not present

## 2019-11-01 DIAGNOSIS — Z7951 Long term (current) use of inhaled steroids: Secondary | ICD-10-CM | POA: Insufficient documentation

## 2019-11-01 DIAGNOSIS — J849 Interstitial pulmonary disease, unspecified: Secondary | ICD-10-CM | POA: Diagnosis not present

## 2019-11-01 DIAGNOSIS — J84112 Idiopathic pulmonary fibrosis: Secondary | ICD-10-CM | POA: Insufficient documentation

## 2019-11-01 DIAGNOSIS — Z20822 Contact with and (suspected) exposure to covid-19: Secondary | ICD-10-CM | POA: Insufficient documentation

## 2019-11-01 DIAGNOSIS — T886XXA Anaphylactic reaction due to adverse effect of correct drug or medicament properly administered, initial encounter: Secondary | ICD-10-CM | POA: Diagnosis not present

## 2019-11-01 DIAGNOSIS — I2510 Atherosclerotic heart disease of native coronary artery without angina pectoris: Secondary | ICD-10-CM

## 2019-11-01 DIAGNOSIS — Z7902 Long term (current) use of antithrombotics/antiplatelets: Secondary | ICD-10-CM | POA: Insufficient documentation

## 2019-11-01 DIAGNOSIS — E785 Hyperlipidemia, unspecified: Secondary | ICD-10-CM | POA: Diagnosis not present

## 2019-11-01 DIAGNOSIS — J8410 Pulmonary fibrosis, unspecified: Secondary | ICD-10-CM | POA: Diagnosis present

## 2019-11-01 DIAGNOSIS — J9621 Acute and chronic respiratory failure with hypoxia: Secondary | ICD-10-CM | POA: Diagnosis present

## 2019-11-01 DIAGNOSIS — Z7952 Long term (current) use of systemic steroids: Secondary | ICD-10-CM | POA: Diagnosis not present

## 2019-11-01 DIAGNOSIS — I10 Essential (primary) hypertension: Secondary | ICD-10-CM | POA: Diagnosis not present

## 2019-11-01 DIAGNOSIS — Z955 Presence of coronary angioplasty implant and graft: Secondary | ICD-10-CM

## 2019-11-01 DIAGNOSIS — I952 Hypotension due to drugs: Secondary | ICD-10-CM

## 2019-11-01 DIAGNOSIS — I2583 Coronary atherosclerosis due to lipid rich plaque: Secondary | ICD-10-CM

## 2019-11-01 DIAGNOSIS — Z7982 Long term (current) use of aspirin: Secondary | ICD-10-CM | POA: Diagnosis not present

## 2019-11-01 DIAGNOSIS — Z87891 Personal history of nicotine dependence: Secondary | ICD-10-CM | POA: Diagnosis not present

## 2019-11-01 DIAGNOSIS — Z79899 Other long term (current) drug therapy: Secondary | ICD-10-CM | POA: Diagnosis not present

## 2019-11-01 DIAGNOSIS — D72829 Elevated white blood cell count, unspecified: Secondary | ICD-10-CM | POA: Insufficient documentation

## 2019-11-01 LAB — BLOOD GAS, VENOUS
Acid-Base Excess: 2 mmol/L (ref 0.0–2.0)
Bicarbonate: 27.8 mmol/L (ref 20.0–28.0)
FIO2: 0.45
O2 Saturation: 58 %
Patient temperature: 37
pCO2, Ven: 47 mmHg (ref 44.0–60.0)
pH, Ven: 7.38 (ref 7.250–7.430)
pO2, Ven: 31 mmHg — CL (ref 32.0–45.0)

## 2019-11-01 LAB — CBC WITH DIFFERENTIAL/PLATELET
Abs Immature Granulocytes: 0.04 10*3/uL (ref 0.00–0.07)
Basophils Absolute: 0 10*3/uL (ref 0.0–0.1)
Basophils Relative: 0 %
Eosinophils Absolute: 0 10*3/uL (ref 0.0–0.5)
Eosinophils Relative: 0 %
HCT: 41.2 % (ref 39.0–52.0)
Hemoglobin: 13.5 g/dL (ref 13.0–17.0)
Immature Granulocytes: 0 %
Lymphocytes Relative: 20 %
Lymphs Abs: 1.8 10*3/uL (ref 0.7–4.0)
MCH: 30.7 pg (ref 26.0–34.0)
MCHC: 32.8 g/dL (ref 30.0–36.0)
MCV: 93.6 fL (ref 80.0–100.0)
Monocytes Absolute: 0.8 10*3/uL (ref 0.1–1.0)
Monocytes Relative: 9 %
Neutro Abs: 6.5 10*3/uL (ref 1.7–7.7)
Neutrophils Relative %: 71 %
Platelets: 400 10*3/uL (ref 150–400)
RBC: 4.4 MIL/uL (ref 4.22–5.81)
RDW: 17.5 % — ABNORMAL HIGH (ref 11.5–15.5)
WBC: 9.2 10*3/uL (ref 4.0–10.5)
nRBC: 0 % (ref 0.0–0.2)

## 2019-11-01 LAB — BASIC METABOLIC PANEL
Anion gap: 12 (ref 5–15)
BUN: 24 mg/dL — ABNORMAL HIGH (ref 8–23)
CO2: 27 mmol/L (ref 22–32)
Calcium: 9.4 mg/dL (ref 8.9–10.3)
Chloride: 93 mmol/L — ABNORMAL LOW (ref 98–111)
Creatinine, Ser: 1.17 mg/dL (ref 0.61–1.24)
GFR calc Af Amer: >60 mL/min (ref >60)
GFR calc non Af Amer: >60 mL/min (ref >60)
Glucose, Bld: 166 mg/dL — ABNORMAL HIGH (ref 70–99)
Potassium: 3.9 mmol/L (ref 3.5–5.1)
Sodium: 132 mmol/L — ABNORMAL LOW (ref 135–145)

## 2019-11-01 MED ORDER — IPRATROPIUM-ALBUTEROL 0.5-2.5 (3) MG/3ML IN SOLN
3.0000 mL | Freq: Four times a day (QID) | RESPIRATORY_TRACT | Status: DC
  Administered 2019-11-01 – 2019-11-03 (×7): 3 mL via RESPIRATORY_TRACT
  Filled 2019-11-01 (×7): qty 3

## 2019-11-01 MED ORDER — EPINEPHRINE 0.3 MG/0.3ML IJ SOAJ
INTRAMUSCULAR | Status: AC
  Administered 2019-11-01: 0.3 mg via INTRAMUSCULAR
  Filled 2019-11-01: qty 0.3

## 2019-11-01 MED ORDER — FAMOTIDINE IN NACL 20-0.9 MG/50ML-% IV SOLN
20.0000 mg | Freq: Once | INTRAVENOUS | Status: AC
  Administered 2019-11-01: 20 mg via INTRAVENOUS
  Filled 2019-11-01: qty 50

## 2019-11-01 MED ORDER — ENOXAPARIN SODIUM 40 MG/0.4ML ~~LOC~~ SOLN
40.0000 mg | SUBCUTANEOUS | Status: DC
  Administered 2019-11-01 – 2019-11-02 (×2): 40 mg via SUBCUTANEOUS
  Filled 2019-11-01 (×2): qty 0.4

## 2019-11-01 MED ORDER — EPINEPHRINE 0.3 MG/0.3ML IJ SOAJ: 0.3000 mg | Freq: Once | INTRAMUSCULAR | Status: AC

## 2019-11-01 MED ORDER — METHYLPREDNISOLONE SODIUM SUCC 40 MG IJ SOLR
40.0000 mg | Freq: Every day | INTRAMUSCULAR | Status: DC
  Administered 2019-11-02 – 2019-11-03 (×2): 40 mg via INTRAVENOUS
  Filled 2019-11-01 (×2): qty 1

## 2019-11-01 NOTE — ED Notes (Signed)
Dr. Ellender Hose removed from BiPap to assess ability to tolerate room air.  Currently on 3L nasal cannula, 98% SpO2.

## 2019-11-01 NOTE — ED Triage Notes (Addendum)
Pt from home via [facility]. Per EMS, pt DC with Amoxicillin. Pt took amoxicillin at home, c/o [facility], hives, itching all over, slurred speech. Pt hx of COPD, COVID neg; 2 1/2 L oxygen at home chronically. Per [facility] pt found hypoxic 80's, placed on NRB at 10L sat at  98%. Hypotensive at 90/60. Given by [facility] 50mg  benadryl; 2X 0.3 epi; 125 solumedrol; 1 alburetol; 20mg  pepcid; 350 NS. Pt denies CP. EDP Issac at bedside upon arrival. 2x albuterol given upon arrival by [facility].

## 2019-11-01 NOTE — ED Provider Notes (Signed)
[facility] Emergency Department Provider Note  ____________________________________________   First MD Initiated Contact with Patient 11/01/19 1739     (approximate)  I have reviewed the triage vital signs and the nursing notes.   HISTORY  Chief Complaint Allergic Reaction    HPI Austin Patterson is a 64 y.o. male  Here with anaphylaxis. Pt reportedly was recently diagnosed with sinusitis and prescribed augmentin. He took his first dose just prior to arrival. He immediately broke out in a diffuse, ithcy rash with SOB, lip/tongue swelling, and wheezing. EMS called. On EMS arrival, pt tachypneic, hypoxic, and confused. He was mildly hypotensive. Received epipen x 2 en route with intermittent return of increased WOB. Also given steroids, benadryl, pepcid. He now feels improved but does admit his breathing had worsened prior to second dose of epi. No h/o anaphylaxis. No other new allergen exposures. No other complaints. No n/v/d. No fever, chills.        Past Medical History:  Diagnosis Date  . COPD (chronic obstructive pulmonary disease) ([place])   . Hyperlipidemia   . Hypertension   . Pulmonary fibrosis ([place]) 11/2015    Patient Active Problem List   Diagnosis Date Noted  . Acute on chronic respiratory failure with hypoxia ([place])   . RUQ pain   . Essential hypertension   . IPF (idiopathic pulmonary fibrosis) ([place]) 08/30/2019  . Hypomagnesemia 03/31/2017  . Hiatal hernia   . Reflux esophagitis   . Hematemesis without nausea   . Hyponatremia 09/21/2016  . Multifocal pneumonia 12/09/2015  . COPD with acute exacerbation ([place]) 10/14/2015    Past Surgical History:  Procedure Laterality Date  . CORONARY STENT PLACEMENT    . ESOPHAGOGASTRODUODENOSCOPY (EGD) WITH PROPOFOL N/A 09/23/2016   Procedure: ESOPHAGOGASTRODUODENOSCOPY (EGD) WITH PROPOFOL;  Surgeon: [name], MD;  Location: [facility] ENDOSCOPY;  Service: Endoscopy;  Laterality: N/A;  . SHOULDER  ACROMIOPLASTY      Prior to Admission medications   Medication Sig Start Date End Date Taking? Authorizing Provider  albuterol (PROVENTIL HFA;VENTOLIN HFA) 108 (90 Base) MCG/ACT inhaler Inhale 2 puffs into the lungs every 6 (six) hours as needed for wheezing or shortness of breath. 01/29/17   Darel Hong, MD  amLODipine (NORVASC) 10 MG tablet Take 1 tablet (10 mg total) by mouth daily. 08/19/16   Tawni Millers, MD  aspirin EC 81 MG tablet Take 1 tablet (81 mg total) by mouth daily. 08/19/16   Tawni Millers, MD  atorvastatin (LIPITOR) 40 MG tablet Take 1 tablet (40 mg total) by mouth at bedtime. 08/19/16   Tawni Millers, MD  azithromycin (ZITHROMAX) 250 MG tablet One tab daily for two days 09/03/19   Loletha Grayer, MD  calcium-vitamin D (OSCAL WITH D) 500-200 MG-UNIT tablet Take 2 tablets by mouth daily with breakfast. 09/24/16   Fritzi Mandes, MD  cetirizine (ZYRTEC) 10 MG tablet Take 10 mg by mouth at bedtime. 08/19/16   [provider]  clopidogrel (PLAVIX) 75 MG tablet Take 1 tablet (75 mg total) by mouth daily. 08/19/16   Tawni Millers, MD  Fluticasone-Salmeterol (ADVAIR DISKUS) 250-50 MCG/DOSE AEPB Inhale 1 puff into the lungs 2 (two) times daily. 10/31/19 10/30/20  Tyler Pita, MD  ipratropium-albuterol (DUONEB) 0.5-2.5 (3) MG/3ML SOLN Take 3 mLs by nebulization 4 (four) times daily as needed (wheezing/shortness of breath).  08/08/19   [provider]  magnesium oxide (MAG-OX) 400 (241.3 Mg) MG tablet Take 1 tablet (400 mg total) by mouth daily. 06/18/17  Hillary Bow, MD  metoprolol succinate (TOPROL-XL) 50 MG 24 hr tablet Take 1 tablet (50 mg total) by mouth daily. 08/19/16   Tawni Millers, MD  Multiple Vitamin (MULTIVITAMIN WITH MINERALS) TABS tablet Take 1 tablet by mouth daily. 09/24/16   Fritzi Mandes, MD  pantoprazole (PROTONIX) 40 MG tablet Take 1 tablet (40 mg total) by mouth 2 (two) times daily. 09/23/16   Fritzi Mandes, MD  potassium chloride 20 MEQ TBCR Take  20 mEq by mouth daily. 06/18/17   Hillary Bow, MD  predniSONE (DELTASONE) 10 MG tablet Take 1 tablet (10 mg total) by mouth daily with breakfast. 10/30/19   Tyler Pita, MD  Spacer/Aero-Holding Chambers (AEROCHAMBER MV) inhaler Use as instructed 10/05/19   Tyler Pita, MD    Allergies Amoxicillin  Family History  Problem Relation Age of Onset  . Heart disease Mother     Social History Social History   Tobacco Use  . Smoking status: Former Smoker    Packs/day: 2.00    Years: 50.00    Pack years: 100.00    Types: Cigarettes    Quit date: 10/14/2015    Years since quitting: 4.0  . Smokeless tobacco: Former Network engineer Use Topics  . Alcohol use: Yes    Alcohol/week: 28.0 standard drinks    Types: 28 Cans of beer per week    Comment: 6 12 oz cans of beer/day  . Drug use: No    Review of Systems  Review of Systems  Constitutional: Positive for fatigue. Negative for chills and fever.  HENT: Positive for facial swelling. Negative for sore throat.   Respiratory: Positive for cough, chest tightness and wheezing. Negative for shortness of breath.   Cardiovascular: Negative for chest pain.  Gastrointestinal: Positive for nausea. Negative for abdominal pain.  Genitourinary: Negative for flank pain.  Musculoskeletal: Negative for neck pain.  Skin: Negative for rash and wound.  Allergic/Immunologic: Negative for immunocompromised state.  Neurological: Positive for speech difficulty and weakness. Negative for numbness.  Hematological: Does not bruise/bleed easily.  All other systems reviewed and are negative.    ____________________________________________  PHYSICAL EXAM:      VITAL SIGNS: ED Triage Vitals  Enc Vitals Group     BP      Pulse      Resp      Temp      Temp src      SpO2      Weight      Height      Head Circumference      Peak Flow      Pain Score      Pain Loc      Pain Edu?      Excl. in [place]?      Physical Exam Vitals and  nursing note reviewed.  Constitutional:      General: He is in acute distress.     Appearance: He is well-developed. He is ill-appearing.  HENT:     Head: Normocephalic and atraumatic.     Comments: Moderate facial swelling and periorbital edema    Mouth/Throat:     Mouth: Mucous membranes are dry.  Eyes:     Conjunctiva/sclera: Conjunctivae normal.  Cardiovascular:     Rate and Rhythm: Regular rhythm. Tachycardia present.     Heart sounds: Normal heart sounds.  Pulmonary:     Effort: Tachypnea and respiratory distress present.     Breath sounds: Decreased breath sounds and wheezing present.  Abdominal:  General: There is no distension.  Musculoskeletal:     Cervical back: Neck supple.  Skin:    General: Skin is warm.     Capillary Refill: Capillary refill takes less than 2 seconds.     Findings: No rash.  Neurological:     Mental Status: He is alert and oriented to person, place, and time.     Motor: No abnormal muscle tone.       ____________________________________________   LABS (all labs ordered are listed, but only abnormal results are displayed)  Labs Reviewed  BLOOD GAS, VENOUS - Abnormal; Notable for the following components:      Result Value   pO2, Ven 31.0 (*)    All other components within normal limits  CBC WITH DIFFERENTIAL/PLATELET - Abnormal; Notable for the following components:   RDW 17.5 (*)    All other components within normal limits  BASIC METABOLIC PANEL - Abnormal; Notable for the following components:   Sodium 132 (*)    Chloride 93 (*)    Glucose, Bld 166 (*)    BUN 24 (*)    All other components within normal limits    ____________________________________________  EKG: Normal sinus rhythm, VR 88. QRS 81, QTc 421. No acute ST elevations or depressions. ________________________________________  RADIOLOGY All imaging, including plain films, CT scans, and ultrasounds, independently reviewed by me, and interpretations confirmed via  formal radiology reads.  ED MD interpretation:   None  Official radiology report(s): No results found.  ____________________________________________  PROCEDURES   Procedure(s) performed (including Critical Care):  .Critical Care Performed by: Duffy Bruce, MD Authorized by: Duffy Bruce, MD   Critical care provider statement:    Critical care time (minutes):  35   Critical care time was exclusive of:  Separately billable procedures and treating other patients and teaching time   Critical care was necessary to treat or prevent imminent or life-threatening deterioration of the following conditions:  Cardiac failure, circulatory failure and respiratory failure   Critical care was time spent personally by me on the following activities:  Development of treatment plan with patient or surrogate, discussions with consultants, evaluation of patient's response to treatment, examination of patient, obtaining history from patient or surrogate, ordering and performing treatments and interventions, ordering and review of laboratory studies, ordering and review of radiographic studies, pulse oximetry, re-evaluation of patient's condition and review of old charts   I assumed direction of critical care for this patient from another provider in my specialty: no      ____________________________________________  INITIAL IMPRESSION / MDM / [place] / ED COURSE  As part of my medical decision making, I reviewed the following data within the [place] notes reviewed and incorporated, Old chart reviewed, Notes from prior ED visits, and [place] Controlled Substance Database       *Viet Ewell Cieslik was evaluated in Emergency Department on 11/01/2019 for the symptoms described in the history of present illness. He was evaluated in the context of the global COVID-19 pandemic, which necessitated consideration that the patient might be at risk for infection with the  SARS-CoV-2 virus that causes COVID-19. Institutional protocols and algorithms that pertain to the evaluation of patients at risk for COVID-19 are in a state of rapid change based on information released by regulatory bodies including the CDC and federal and state organizations. These policies and algorithms were followed during the patient's care in the ED.  Some ED evaluations and interventions may be delayed as  a result of limited staffing during the pandemic.*     Medical Decision Making:  63 yo M here with anaphylaxis 2/2 Augmentin exposure. Pt markedly tachypneic, wheezing on arrival with facial edema. Given a total of epi pen x 3 with eventual improvement, as well as albuterol 10 mg/hr x 2 with steroids, benadryl, pepcid. Will admit for obs. He was trialed on BIPAP for WOB/COPD as well and eventually able to come off this successfully. VBG w/o CO2 retention.  ____________________________________________  FINAL CLINICAL IMPRESSION(S) / ED DIAGNOSES  Final diagnoses:  Anaphylaxis, initial encounter     MEDICATIONS GIVEN DURING THIS VISIT:  Medications  famotidine (PEPCID) IVPB 20 mg premix (0 mg Intravenous Stopped 11/01/19 1846)  EPINEPHrine (EPI-PEN) injection 0.3 mg (0.3 mg Intramuscular Given by Other 11/01/19 1754)     ED Discharge Orders    None       Note:  This document was prepared using Dragon voice recognition software and may include unintentional dictation errors.   Duffy Bruce, MD 11/01/19 2001

## 2019-11-01 NOTE — ED Notes (Signed)
Patient resting quietly in room with no apparent acute distress.  Patient was provided ice chips per request and expressing no other needs at this time.  Toileting offered, bed in lowest position, call light within reach.  Will continue to monitor.

## 2019-11-01 NOTE — ED Notes (Signed)
This Rn attempted to establish 1X PIV unsuccessfully.

## 2019-11-01 NOTE — H&P (Signed)
History and Physical    Austin Patterson [id number] DOB: 1956-03-21 DOA: 11/01/2019  PCP: Laneta Simmers, NP  Patient coming from: Home  I have personally briefly reviewed patient's old medical records in [place]  Chief Complaint: hives, shortness of breath  HPI: Austin Patterson is a 64 y.o. male with medical history significant of CAD s/p stent, COPD on 2L, IPF, hypertension, and GERD who presents with concerns of anaphylaxis.   Pt saw his PCP today and was given Amoxillicin for sinusitis. Within 10 minutes of taking the antibiotic he noticed facial redness, swelling, itching, slurred speech and hand tremors. Also had increasing shortness of breath and chest pressure. Per EMS, pt found to be hypoxic to 80% on home 2L and had to be placed on NRB at 10L with saturation of 98%. BP of 90/60. He was given 50mg  benadryl, 0.3 epi x 2, 125 solumedrol; 1 albuterol, 20mg  pepcid and 350 NS.  He was given a 3rd epi in the ED and 20mg  pepcid. He also developed worsening shortness of breath after multiple epi and required Bipap. Eventually was able to wean down to 2L.   He was asymptomatic at the time of my evaluation.  No prior hx of allergies.  Quit tobacco 8yrs ago. Occasionally alcohol and denies illicit drugs.   WBC of 9.2, hemoglobin of 13.5. Na of 132, glucose of 166, creatinine of 1.17.  CXR negative.   Review of Systems: Constitutional: No Weight Change, No Fever ENT/Mouth: No sore throat, No Rhinorrhea Eyes: No Vision Changes Cardiovascular: + Chest Pain, + SOB Respiratory: No Cough, No Sputum Gastrointestinal: No Nausea, No Vomiting Genitourinary: no Urinary Incontinence,  Musculoskeletal: No Arthralgias, No Myalgias Skin: No Skin Lesions, + Pruritus Neuro: no Weakness, No Numbness,  No Loss of Consciousness, No Syncope Psych: No Anxiety/Panic, No Depression, no decrease appetite Heme/Lymph: No Bruising, No Bleeding  Past Medical History:  Diagnosis Date  .  COPD (chronic obstructive pulmonary disease) ([place])   . Hyperlipidemia   . Hypertension   . Pulmonary fibrosis ([place]) 11/2015    Past Surgical History:  Procedure Laterality Date  . CORONARY STENT PLACEMENT    . ESOPHAGOGASTRODUODENOSCOPY (EGD) WITH PROPOFOL N/A 09/23/2016   Procedure: ESOPHAGOGASTRODUODENOSCOPY (EGD) WITH PROPOFOL;  Surgeon: [name], MD;  Location: [facility] ENDOSCOPY;  Service: Endoscopy;  Laterality: N/A;  . SHOULDER ACROMIOPLASTY       reports that he quit smoking about 4 years ago. His smoking use included cigarettes. He has a 100.00 pack-year smoking history. He has quit using smokeless tobacco. He reports current alcohol use of about 28.0 standard drinks of alcohol per week. He reports that he does not use drugs.  Allergies  Allergen Reactions  . Amoxicillin Anaphylaxis    Family History  Problem Relation Age of Onset  . Heart disease Mother      Prior to Admission medications   Medication Sig Start Date End Date Taking? Authorizing Provider  albuterol (PROVENTIL HFA;VENTOLIN HFA) 108 (90 Base) MCG/ACT inhaler Inhale 2 puffs into the lungs every 6 (six) hours as needed for wheezing or shortness of breath. 01/29/17   Darel Hong, MD  amLODipine (NORVASC) 10 MG tablet Take 1 tablet (10 mg total) by mouth daily. 08/19/16   Tawni Millers, MD  aspirin EC 81 MG tablet Take 1 tablet (81 mg total) by mouth daily. 08/19/16   Tawni Millers, MD  atorvastatin (LIPITOR) 40 MG tablet Take 1 tablet (40 mg total) by mouth at bedtime. 08/19/16  Tawni Millers, MD  azithromycin [facility]) 250 MG tablet One tab daily for two days 09/03/19   Loletha Grayer, MD  calcium-vitamin D (OSCAL WITH D) 500-200 MG-UNIT tablet Take 2 tablets by mouth daily with breakfast. 09/24/16   Fritzi Mandes, MD  cetirizine (ZYRTEC) 10 MG tablet Take 10 mg by mouth at bedtime. 08/19/16   [provider]  clopidogrel (PLAVIX) 75 MG tablet Take 1 tablet (75 mg total) by mouth daily. 08/19/16    Tawni Millers, MD  Fluticasone-Salmeterol (ADVAIR DISKUS) 250-50 MCG/DOSE AEPB Inhale 1 puff into the lungs 2 (two) times daily. 10/31/19 10/30/20  Tyler Pita, MD  ipratropium-albuterol (DUONEB) 0.5-2.5 (3) MG/3ML SOLN Take 3 mLs by nebulization 4 (four) times daily as needed (wheezing/shortness of breath).  08/08/19   [provider]  magnesium oxide (MAG-OX) 400 (241.3 Mg) MG tablet Take 1 tablet (400 mg total) by mouth daily. 06/18/17   Hillary Bow, MD  metoprolol succinate (TOPROL-XL) 50 MG 24 hr tablet Take 1 tablet (50 mg total) by mouth daily. 08/19/16   Tawni Millers, MD  Multiple Vitamin (MULTIVITAMIN WITH MINERALS) TABS tablet Take 1 tablet by mouth daily. 09/24/16   Fritzi Mandes, MD  pantoprazole (PROTONIX) 40 MG tablet Take 1 tablet (40 mg total) by mouth 2 (two) times daily. 09/23/16   Fritzi Mandes, MD  potassium chloride 20 MEQ TBCR Take 20 mEq by mouth daily. 06/18/17   Hillary Bow, MD  predniSONE (DELTASONE) 10 MG tablet Take 1 tablet (10 mg total) by mouth daily with breakfast. 10/30/19   Tyler Pita, MD  Spacer/Aero-Holding Chambers (AEROCHAMBER MV) inhaler Use as instructed 10/05/19   Tyler Pita, MD    Physical Exam: Vitals:   11/01/19 1815 11/01/19 1850 11/01/19 1930 11/01/19 2000  BP: (!) 142/80  (!) 138/125 (!) 158/78  Pulse: 93  84 99  Resp: 17  19 (!) 21  Temp:  98.9 F (37.2 C)    TempSrc:  Oral    SpO2: 99%  99% 97%  Weight:      Height:        Constitutional: NAD, calm, comfortable, laying at 30 degree incline in bed. No facial flushing.  Vitals:   11/01/19 1815 11/01/19 1850 11/01/19 1930 11/01/19 2000  BP: (!) 142/80  (!) 138/125 (!) 158/78  Pulse: 93  84 99  Resp: 17  19 (!) 21  Temp:  98.9 F (37.2 C)    TempSrc:  Oral    SpO2: 99%  99% 97%  Weight:      Height:       Eyes: PERRL, lids and conjunctivae normal ENMT: Mucous membranes are moist. Posterior pharynx clear of any exudate or lesions No angioedema. Neck:  normal, supple Respiratory: poor aeration throughout but no wheezing, no crackles. Normal respiratory effort on 3L via [place]. No accessory muscle use.  Cardiovascular: Regular rate and rhythm, no murmurs / rubs / gallops. No extremity edema.  Abdomen: no tenderness, no masses palpated. Bowel sounds positive.  Musculoskeletal: no clubbing / cyanosis. No joint deformity upper and lower extremities. Good ROM, no contractures. Normal muscle tone.  Skin: no rashes, lesions, or hives.  Neurologic: CN 2-12 grossly intact. Sensation intact. Strength 5/5 in all 4.  Psychiatric: Normal judgment and insight. Alert and oriented x 3. Normal mood.     Labs on Admission: I have personally reviewed following labs and imaging studies  CBC: Recent Labs  Lab 10/29/19 0404 11/01/19 1750  WBC 7.7 9.2  NEUTROABS  --  6.5  HGB 11.9* 13.5  HCT 34.5* 41.2  MCV 89.4 93.6  PLT 294 [id number]   Basic Metabolic Panel: Recent Labs  Lab 10/29/19 0404 11/01/19 1750  NA 125* 132*  K 3.7 3.9  CL 89* 93*  CO2 20* 27  GLUCOSE 114* 166*  BUN 16 24*  CREATININE 0.98 1.17  CALCIUM 9.1 9.4   GFR: Estimated Creatinine Clearance: 70.2 mL/min (by C-G formula based on SCr of 1.17 mg/dL). Liver Function Tests: Recent Labs  Lab 10/29/19 0404  AST 40  ALT 23  ALKPHOS 58  BILITOT 1.1  PROT 7.6  ALBUMIN 4.4   Recent Labs  Lab 10/29/19 0404  LIPASE 34   No results for input(s): AMMONIA in the last 168 hours. Coagulation Profile: No results for input(s): INR, PROTIME in the last 168 hours. Cardiac Enzymes: No results for input(s): CKTOTAL, CKMB, CKMBINDEX, TROPONINI in the last 168 hours. BNP (last 3 results) No results for input(s): PROBNP in the last 8760 hours. HbA1C: No results for input(s): HGBA1C in the last 72 hours. CBG: No results for input(s): GLUCAP in the last 168 hours. Lipid Profile: No results for input(s): CHOL, HDL, LDLCALC, TRIG, CHOLHDL, LDLDIRECT in the last 72 hours. Thyroid Function  Tests: No results for input(s): TSH, T4TOTAL, FREET4, T3FREE, THYROIDAB in the last 72 hours. Anemia Panel: No results for input(s): VITAMINB12, FOLATE, FERRITIN, TIBC, IRON, RETICCTPCT in the last 72 hours. Urine analysis:    Component Value Date/Time   COLORURINE YELLOW (A) 12/24/2017 1750   APPEARANCEUR HAZY (A) 12/24/2017 1750   LABSPEC 1.017 12/24/2017 1750   PHURINE 5.0 12/24/2017 1750   GLUCOSEU NEGATIVE 12/24/2017 1750   HGBUR NEGATIVE 12/24/2017 1750   BILIRUBINUR NEGATIVE 12/24/2017 1750   KETONESUR NEGATIVE 12/24/2017 1750   PROTEINUR 30 (A) 12/24/2017 1750   NITRITE NEGATIVE 12/24/2017 1750   LEUKOCYTESUR NEGATIVE 12/24/2017 1750    Radiological Exams on Admission: DG Chest Portable 1 View  Result Date: 11/01/2019 CLINICAL DATA:  Shortness of breath, wheezing EXAM: PORTABLE CHEST 1 VIEW COMPARISON:  10/29/2019, chest CT 01/29/2016 FINDINGS: Heart is normal size. Peribronchial thickening and interstitial prominence throughout the lungs. This is stable since prior study and prior high-resolution chest CT from 2017 compatible with chronic lung disease/fibrosis. No acute confluent opacity or effusion. No acute bony abnormality. IMPRESSION: Chronic interstitial changes again noted, stable. No acute cardiopulmonary process. Electronically Signed   By: Rolm Baptise M.D.   On: 11/01/2019 20:14    EKG: Independently reviewed.   Assessment/Plan  Anaphylaxis Initially presented with anaphylactic shock and acute hypoxic respiratory failure requiring BiPAP following intake of Amoxicillin Received a total of epi x3, 50 mg Benadryl, 125 Solu-Medrol, albuterol x2, 40 mg Pepcid and IV fluids Continue to closely monitor on step down unit  Continue daily IV solu-medrol   Acute on chronic hypoxic respiratory failure secondary to COPD exacerbation with hx of IPF exacerbated by epinephrine administration Chronic on 2L  initially required Bipap now at 3 L continuous pulse ox  Continue  daily IV solu-medrol 40mg   Hypotension Now normotensive hold amlodipine and metoprolol due to prior anaphylactic shock  CAD s/p stent Stable  Continue aspirin and Plavix   Hyperlipidemia Continue statin  GERD Continue PPI  DVT prophylaxis:.Lovenox Code Status: Full Family Communication: Plan discussed with patient at bedside  disposition Plan: Home with observation Consults called:  Admission status: Observation  Gilad Dugger T Summar Mcglothlin DO [facility]   If 7PM-7AM, please contact night-coverage [email or web address] Password  [email or web address]  11/01/2019, 8:51 PM

## 2019-11-01 NOTE — ED Notes (Signed)
O2 by nasal cannula decreased to 2L.  SpO2 at 96% currently.

## 2019-11-01 NOTE — ED Notes (Signed)
Patient resting quietly in room with no apparent acute distress.  Patient placed in gown, belongings placed in bag, assisted to use urinal.  He is expressing no needs at this time.  Bed in lowest position, call light within reach.  SpO2 97% on 3L nasal cannula.  Will continue to monitor.

## 2019-11-01 NOTE — ED Notes (Signed)
Pt able to speak in full clear sentences at this time.

## 2019-11-02 DIAGNOSIS — I2510 Atherosclerotic heart disease of native coronary artery without angina pectoris: Secondary | ICD-10-CM | POA: Diagnosis not present

## 2019-11-02 DIAGNOSIS — I2583 Coronary atherosclerosis due to lipid rich plaque: Secondary | ICD-10-CM | POA: Diagnosis not present

## 2019-11-02 DIAGNOSIS — T782XXA Anaphylactic shock, unspecified, initial encounter: Secondary | ICD-10-CM | POA: Diagnosis not present

## 2019-11-02 DIAGNOSIS — J9621 Acute and chronic respiratory failure with hypoxia: Secondary | ICD-10-CM | POA: Diagnosis not present

## 2019-11-02 LAB — CBC
HCT: 34.7 % — ABNORMAL LOW (ref 39.0–52.0)
Hemoglobin: 11.7 g/dL — ABNORMAL LOW (ref 13.0–17.0)
MCH: 31.5 pg (ref 26.0–34.0)
MCHC: 33.7 g/dL (ref 30.0–36.0)
MCV: 93.5 fL (ref 80.0–100.0)
Platelets: 290 10*3/uL (ref 150–400)
RBC: 3.71 MIL/uL — ABNORMAL LOW (ref 4.22–5.81)
RDW: 17.7 % — ABNORMAL HIGH (ref 11.5–15.5)
WBC: 16.8 10*3/uL — ABNORMAL HIGH (ref 4.0–10.5)
nRBC: 0 % (ref 0.0–0.2)

## 2019-11-02 LAB — BASIC METABOLIC PANEL
Anion gap: 14 (ref 5–15)
BUN: 24 mg/dL — ABNORMAL HIGH (ref 8–23)
CO2: 25 mmol/L (ref 22–32)
Calcium: 9.4 mg/dL (ref 8.9–10.3)
Chloride: 96 mmol/L — ABNORMAL LOW (ref 98–111)
Creatinine, Ser: 0.89 mg/dL (ref 0.61–1.24)
GFR calc Af Amer: >60 mL/min (ref >60)
GFR calc non Af Amer: >60 mL/min (ref >60)
Glucose, Bld: 190 mg/dL — ABNORMAL HIGH (ref 70–99)
Potassium: 4.1 mmol/L (ref 3.5–5.1)
Sodium: 135 mmol/L (ref 135–145)

## 2019-11-02 LAB — SARS CORONAVIRUS 2 (TAT 6-24 HRS): SARS Coronavirus 2: NEGATIVE

## 2019-11-02 MED ORDER — CLOPIDOGREL BISULFATE 75 MG PO TABS
75.0000 mg | ORAL_TABLET | Freq: Every day | ORAL | Status: DC
  Administered 2019-11-02 – 2019-11-03 (×2): 75 mg via ORAL
  Filled 2019-11-02 (×2): qty 1

## 2019-11-02 MED ORDER — ASPIRIN EC 81 MG PO TBEC
81.0000 mg | DELAYED_RELEASE_TABLET | Freq: Every day | ORAL | Status: DC
  Administered 2019-11-02 – 2019-11-03 (×2): 81 mg via ORAL
  Filled 2019-11-02 (×2): qty 1

## 2019-11-02 MED ORDER — PANTOPRAZOLE SODIUM 40 MG PO TBEC
40.0000 mg | DELAYED_RELEASE_TABLET | Freq: Two times a day (BID) | ORAL | Status: DC
  Administered 2019-11-02 – 2019-11-03 (×4): 40 mg via ORAL
  Filled 2019-11-02 (×4): qty 1

## 2019-11-02 MED ORDER — ACETAMINOPHEN 500 MG PO TABS
ORAL_TABLET | ORAL | Status: AC
  Administered 2019-11-02: 06:00:00 1000 mg via ORAL
  Filled 2019-11-02: qty 2

## 2019-11-02 MED ORDER — AMLODIPINE BESYLATE 10 MG PO TABS
10.0000 mg | ORAL_TABLET | Freq: Every day | ORAL | Status: DC
  Administered 2019-11-03: 11:00:00 10 mg via ORAL
  Filled 2019-11-02: qty 1

## 2019-11-02 MED ORDER — METOPROLOL SUCCINATE ER 50 MG PO TB24
50.0000 mg | ORAL_TABLET | Freq: Every day | ORAL | Status: DC
  Administered 2019-11-03: 50 mg via ORAL
  Filled 2019-11-02: qty 1

## 2019-11-02 MED ORDER — ATORVASTATIN CALCIUM 20 MG PO TABS
40.0000 mg | ORAL_TABLET | Freq: Every day | ORAL | Status: DC
  Administered 2019-11-02 (×2): 40 mg via ORAL
  Filled 2019-11-02 (×2): qty 2

## 2019-11-02 MED ORDER — ACETAMINOPHEN 500 MG PO TABS
1000.0000 mg | ORAL_TABLET | Freq: Three times a day (TID) | ORAL | Status: DC | PRN
  Administered 2019-11-02: 12:00:00 1000 mg via ORAL
  Filled 2019-11-02: qty 2

## 2019-11-02 MED ORDER — POTASSIUM CHLORIDE CRYS ER 20 MEQ PO TBCR
20.0000 meq | EXTENDED_RELEASE_TABLET | Freq: Every day | ORAL | Status: DC
  Administered 2019-11-02 – 2019-11-03 (×2): 20 meq via ORAL
  Filled 2019-11-02 (×2): qty 1

## 2019-11-02 MED ORDER — MAGNESIUM OXIDE 400 (241.3 MG) MG PO TABS
400.0000 mg | ORAL_TABLET | Freq: Every day | ORAL | Status: DC
  Administered 2019-11-02 – 2019-11-03 (×2): 400 mg via ORAL
  Filled 2019-11-02 (×2): qty 1

## 2019-11-02 NOTE — ED Notes (Signed)
Patient resting quietly in room with no apparent acute distress and expressing no needs at this time.  Catheter bag emptied, bed in lowest position, call light within reach.  Will continue to monitor.

## 2019-11-02 NOTE — ED Notes (Signed)
Patient resting quietly in room with no apparent acute distress, watching TV.  Meal tray is provided per request.  Expressing no other needs at this time.  Bed in lowest position, call light within reach.  Will continue to monitor.  SpO2 96%.

## 2019-11-02 NOTE — ED Notes (Signed)
ED TO INPATIENT HANDOFF REPORT  ED Nurse Name and Phone #: Willene Hatchet Name/Age/Gender Austin Patterson 64 y.o. male Room/Bed: ED30A/ED30A  Code Status   Code Status: Full Code  Home/SNF/Other Home Patient oriented to: self, place, time and situation Is this baseline? Yes   Triage Complete: Triage complete  Chief Complaint Anaphylaxis [T78.2XXA]  Triage Note Pt from home via [place]. Per EMS, pt DC with Amoxicillin. Pt took amoxicillin at home, c/o [facility], hives, itching all over, slurred speech. Pt hx of COPD, COVID neg; 2 1/2 L oxygen at home chronically. Per [facility] pt found hypoxic 80's, placed on NRB at 10L sat at  98%. Hypotensive at 90/60. Given by [facility] 50mg  benadryl; 2X 0.3 epi; 125 solumedrol; 1 alburetol; 20mg  pepcid; 350 NS. Pt denies CP. EDP Issac at bedside upon arrival. 2x albuterol given upon arrival by [facility].     Allergies Allergies  Allergen Reactions  . Amoxicillin Anaphylaxis    Level of Care/Admitting Diagnosis ED Disposition    ED Disposition Condition [facility]: [place] [100120]  Level of Care: Med-Surg [16]  Covid Evaluation: Confirmed COVID Negative  Date Laboratory Confirmed COVID Negative: 11/01/2019  Diagnosis: Anaphylaxis [id number]  Admitting Physician: Orene Desanctis [id number]  Attending Physician: Orene Desanctis [id number]       B Medical/Surgery History Past Medical History:  Diagnosis Date  . COPD (chronic obstructive pulmonary disease) ([place])   . Hyperlipidemia   . Hypertension   . Pulmonary fibrosis ([place]) 11/2015   Past Surgical History:  Procedure Laterality Date  . CORONARY STENT PLACEMENT    . ESOPHAGOGASTRODUODENOSCOPY (EGD) WITH PROPOFOL N/A 09/23/2016   Procedure: ESOPHAGOGASTRODUODENOSCOPY (EGD) WITH PROPOFOL;  Surgeon: [name], MD;  Location: [facility] ENDOSCOPY;  Service: Endoscopy;  Laterality: N/A;  . SHOULDER ACROMIOPLASTY       A IV Location/Drains/Wounds Patient  Lines/Drains/Airways Status   Active Line/Drains/Airways    Name:   Placement date:   Placement time:   Site:   Days:   Peripheral IV 11/01/19 Right Antecubital   11/01/19    1751    Antecubital   1   External Urinary Catheter   11/01/19    2115    --   1          Intake/Output Last 24 hours  Intake/Output Summary (Last 24 hours) at 11/02/2019 1509 Last data filed at 11/02/2019 0416 Gross per 24 hour  Intake --  Output 825 ml  Net -825 ml    Labs/Imaging Results for orders placed or performed during the hospital encounter of 11/01/19 (from the past 48 hour(s))  Blood gas, venous     Status: Abnormal   Collection Time: 11/01/19  5:50 PM  Result Value Ref Range   FIO2 0.45    Delivery systems NASAL CANNULA    pH, Ven 7.38 7.250 - 7.430   pCO2, Ven 47 44.0 - 60.0 mmHg   pO2, Ven 31.0 (LL) 32.0 - 45.0 mmHg    Comment: CRITICAL RESULT CALLED TO, READ BACK BY AND VERIFIED WITH: DR ISAAC AT 1803 ON 11/01/19 BL    Bicarbonate 27.8 20.0 - 28.0 mmol/L   Acid-Base Excess 2.0 0.0 - 2.0 mmol/L   O2 Saturation 58.0 %   Patient temperature 37.0    Collection site VENOUS    Sample type VENOUS     Comment: Performed at [facility], [address] [place], [place] [address]  CBC with Differential  Status: Abnormal   Collection Time: 11/01/19  5:50 PM  Result Value Ref Range   WBC 9.2 4.0 - 10.5 K/uL   RBC 4.40 4.22 - 5.81 MIL/uL   Hemoglobin 13.5 13.0 - 17.0 g/dL   HCT 41.2 39.0 - 52.0 %   MCV 93.6 80.0 - 100.0 fL   MCH 30.7 26.0 - 34.0 pg   MCHC 32.8 30.0 - 36.0 g/dL   RDW 17.5 (H) 11.5 - 15.5 %   Platelets 400 150 - 400 K/uL   nRBC 0.0 0.0 - 0.2 %   Neutrophils Relative % 71 %   Neutro Abs 6.5 1.7 - 7.7 K/uL   Lymphocytes Relative 20 %   Lymphs Abs 1.8 0.7 - 4.0 K/uL   Monocytes Relative 9 %   Monocytes Absolute 0.8 0.1 - 1.0 K/uL   Eosinophils Relative 0 %   Eosinophils Absolute 0.0 0.0 - 0.5 K/uL   Basophils Relative 0 %   Basophils Absolute 0.0 0.0 - 0.1  K/uL   Immature Granulocytes 0 %   Abs Immature Granulocytes 0.04 0.00 - 0.07 K/uL    Comment: Performed at [facility], [address] [place], [place] [id number]  Basic metabolic panel     Status: Abnormal   Collection Time: 11/01/19  5:50 PM  Result Value Ref Range   Sodium 132 (L) 135 - 145 mmol/L   Potassium 3.9 3.5 - 5.1 mmol/L   Chloride 93 (L) 98 - 111 mmol/L   CO2 27 22 - 32 mmol/L   Glucose, Bld 166 (H) 70 - 99 mg/dL   BUN 24 (H) 8 - 23 mg/dL   Creatinine, Ser 1.17 0.61 - 1.24 mg/dL   Calcium 9.4 8.9 - 10.3 mg/dL   GFR calc non Af Amer >60 >60 mL/min   GFR calc Af Amer >60 >60 mL/min   Anion gap 12 5 - 15    Comment: Performed at [facility], [address] [place], [place] [address]  SARS CORONAVIRUS 2 (TAT 6-24 HRS) Nasopharyngeal Nasopharyngeal Swab     Status: None   Collection Time: 11/01/19  8:11 PM   Specimen: Nasopharyngeal Swab  Result Value Ref Range   SARS Coronavirus 2 NEGATIVE NEGATIVE    Comment: (NOTE) SARS-CoV-2 target nucleic acids are NOT DETECTED. The SARS-CoV-2 RNA is generally detectable in upper and lower respiratory specimens during the acute phase of infection. Negative results do not preclude SARS-CoV-2 infection, do not rule out co-infections with other pathogens, and should not be used as the sole basis for treatment or other patient management decisions. Negative results must be combined with clinical observations, patient history, and epidemiological information. The expected result is Negative. Fact Sheet for Patients: SugarRoll.be Fact Sheet for Healthcare Providers: https://www.woods-mathews.com/ This test is not yet approved or cleared by the [place] FDA and  has been authorized for detection and/or diagnosis of SARS-CoV-2 by FDA under an Emergency Use Authorization (EUA). This EUA will remain  in effect (meaning this test can be used) for the duration of  the COVID-19 declaration under Section 56 4(b)(1) of the Act, 21 U.S.C. section 360bbb-3(b)(1), unless the authorization is terminated or revoked sooner. Performed at [facility], [place] [address] [place] [address]   CBC     Status: Abnormal   Collection Time: 11/02/19  4:41 AM  Result Value Ref Range   WBC 16.8 (H) 4.0 - 10.5 K/uL   RBC 3.71 (L) 4.22 - 5.81 MIL/uL   Hemoglobin  11.7 (L) 13.0 - 17.0 g/dL   HCT 34.7 (L) 39.0 - 52.0 %   MCV 93.5 80.0 - 100.0 fL   MCH 31.5 26.0 - 34.0 pg   MCHC 33.7 30.0 - 36.0 g/dL   RDW 17.7 (H) 11.5 - 15.5 %   Platelets 290 150 - 400 K/uL   nRBC 0.0 0.0 - 0.2 %    Comment: Performed at [facility], [address] [place], [place] [id number]  Basic metabolic panel     Status: Abnormal   Collection Time: 11/02/19  4:41 AM  Result Value Ref Range   Sodium 135 135 - 145 mmol/L   Potassium 4.1 3.5 - 5.1 mmol/L   Chloride 96 (L) 98 - 111 mmol/L   CO2 25 22 - 32 mmol/L   Glucose, Bld 190 (H) 70 - 99 mg/dL   BUN 24 (H) 8 - 23 mg/dL   Creatinine, Ser 0.89 0.61 - 1.24 mg/dL   Calcium 9.4 8.9 - 10.3 mg/dL   GFR calc non Af Amer >60 >60 mL/min   GFR calc Af Amer >60 >60 mL/min   Anion gap 14 5 - 15    Comment: Performed at [facility], [address] [place], [place] [address]   DG Chest Portable 1 View  Result Date: 11/01/2019 CLINICAL DATA:  Shortness of breath, wheezing EXAM: PORTABLE CHEST 1 VIEW COMPARISON:  10/29/2019, chest CT 01/29/2016 FINDINGS: Heart is normal size. Peribronchial thickening and interstitial prominence throughout the lungs. This is stable since prior study and prior high-resolution chest CT from 2017 compatible with chronic lung disease/fibrosis. No acute confluent opacity or effusion. No acute bony abnormality. IMPRESSION: Chronic interstitial changes again noted, stable. No acute cardiopulmonary process. Electronically Signed   By: Rolm Baptise M.D.   On: 11/01/2019 20:14    Pending  Labs Unresulted Labs (From admission, onward)   None      Vitals/Pain Today's Vitals   11/02/19 0930 11/02/19 1000 11/02/19 1030 11/02/19 1100  BP: (!) 152/81 (!) 150/86 (!) 160/84 (!) 146/80  Pulse: 90 92 87 87  Resp: 20 19 20 20   Temp:      TempSrc:      SpO2: 97% 98% 98% 97%  Weight:      Height:        Isolation Precautions No active isolations  Medications Medications  enoxaparin (LOVENOX) injection 40 mg (40 mg Subcutaneous Given 11/01/19 2245)  methylPREDNISolone sodium succinate (SOLU-MEDROL) 40 mg/mL injection 40 mg (40 mg Intravenous Given 11/02/19 1120)  ipratropium-albuterol (DUONEB) 0.5-2.5 (3) MG/3ML nebulizer solution 3 mL (3 mLs Nebulization Given 11/02/19 0751)  aspirin EC tablet 81 mg (81 mg Oral Given 11/02/19 1117)  atorvastatin (LIPITOR) tablet 40 mg (40 mg Oral Given 11/02/19 0435)  pantoprazole (PROTONIX) EC tablet 40 mg (40 mg Oral Given 11/02/19 1118)  clopidogrel (PLAVIX) tablet 75 mg (75 mg Oral Given 11/02/19 1118)  magnesium oxide (MAG-OX) tablet 400 mg (400 mg Oral Given 11/02/19 1117)  potassium chloride SA (KLOR-CON) CR tablet 20 mEq (20 mEq Oral Given 11/02/19 0435)  acetaminophen (TYLENOL) tablet 1,000 mg (1,000 mg Oral Given 11/02/19 1133)  metoprolol succinate (TOPROL-XL) 24 hr tablet 50 mg (has no administration in time range)  amLODipine (NORVASC) tablet 10 mg (has no administration in time range)  famotidine (PEPCID) IVPB 20 mg premix (0 mg Intravenous Stopped 11/01/19 1846)  EPINEPHrine (EPI-PEN) injection 0.3 mg (0.3 mg Intramuscular Given by Other 11/01/19 1754)    Mobility walks Low fall risk  Focused Assessments Pulmonary Assessment Handoff:  Lung sounds: Bilateral Breath Sounds: Diminished L Breath Sounds: Expiratory wheezes R Breath Sounds: Expiratory wheezes O2 Device: Nasal Cannula O2 Flow Rate (L/min): 2 L/min      R Recommendations: See Admitting Provider Note  Report given to:   Additional Notes:

## 2019-11-02 NOTE — Progress Notes (Signed)
PROGRESS NOTE    Austin Patterson  [id number] DOB: 10/04/1956 DOA: 11/01/2019 PCP: Laneta Simmers, NP       Assessment & Plan:   Principal Problem:   Anaphylaxis Active Problems:   COPD with acute exacerbation ([place])   IPF (idiopathic pulmonary fibrosis) ([place])   Essential hypertension   Acute on chronic respiratory failure with hypoxia (H[facility])   Hypotension   CAD (coronary artery disease)   H/O heart artery stent   Anaphylaxis: initially presented with anaphylactic shock and acute hypoxic respiratory failure requiring BiPAP following intake of amoxicillin. S/p epi x3, 50 mg Benadryl, 125 Solu-Medrol, albuterol x2, 40 mg Pepcid and IV fluids. Continue on IV solu-medrol   Acute on chronic hypoxic respiratory failure: secondary to COPD exacerbation with hx of IPF. Continue on supplemental oxygen and currently at baseline of 2L  Leukocytosis: likely secondary to steroid use. Will continue to monitor  Hyperglycemia: no hx of DM. Likely secondary to steroid use. Will continue to monitor    Hypotension: resolved.  HTN: will restart home dose of metoprolol & amlodipine  CAD: s/p stent. Continue aspirin and Plavix   Hyperlipidemia: continue statin  GERD: continue PPI  Generalized weakness: PT consulted    DVT prophylaxis: lovenox Code Status: full  Family Communication:  Disposition Plan:   Consultants:   n/a   Procedures:    Antimicrobials: n/a   Subjective: Pt c/o shortness of breath   Objective: Vitals:   11/02/19 0430 11/02/19 0500 11/02/19 0700 11/02/19 0730  BP: 139/77 (!) 141/88 (!) 151/79 132/80  Pulse: 92 96 94 88  Resp: (!) 21 (!) 22 16 (!) 21  Temp:      TempSrc:      SpO2: 96% 92% 100% 94%  Weight:      Height:        Intake/Output Summary (Last 24 hours) at 11/02/2019 0822 Last data filed at 11/02/2019 0416 Gross per 24 hour  Intake --  Output 825 ml  Net -825 ml   Filed Weights   11/01/19 1744  Weight: 95.3 kg     Examination:  General exam: Appears calm and comfortable  Respiratory system: diminished breath sounds b/l. No rales Cardiovascular system: S1 & S2 +. No  rubs, gallops or clicks.  Gastrointestinal system: Abdomen is nondistended, soft and nontender. Normal bowel sounds heard. Central nervous system: Alert and oriented. Moves all 4 extremities  Psychiatry: Judgement and insight appear normal. Flat mood and affect .      Data Reviewed: I have personally reviewed following labs and imaging studies  CBC: Recent Labs  Lab 10/29/19 0404 11/01/19 1750 11/02/19 0441  WBC 7.7 9.2 16.8*  NEUTROABS  --  6.5  --   HGB 11.9* 13.5 11.7*  HCT 34.5* 41.2 34.7*  MCV 89.4 93.6 93.5  PLT 294 400 [id number]   Basic Metabolic Panel: Recent Labs  Lab 10/29/19 0404 11/01/19 1750 11/02/19 0441  NA 125* 132* 135  K 3.7 3.9 4.1  CL 89* 93* 96*  CO2 20* 27 25  GLUCOSE 114* 166* 190*  BUN 16 24* 24*  CREATININE 0.98 1.17 0.89  CALCIUM 9.1 9.4 9.4   GFR: Estimated Creatinine Clearance: 92.3 mL/min (by C-G formula based on SCr of 0.89 mg/dL). Liver Function Tests: Recent Labs  Lab 10/29/19 0404  AST 40  ALT 23  ALKPHOS 58  BILITOT 1.1  PROT 7.6  ALBUMIN 4.4   Recent Labs  Lab 10/29/19 0404  LIPASE 34   No results for  input(s): AMMONIA in the last 168 hours. Coagulation Profile: No results for input(s): INR, PROTIME in the last 168 hours. Cardiac Enzymes: No results for input(s): CKTOTAL, CKMB, CKMBINDEX, TROPONINI in the last 168 hours. BNP (last 3 results) No results for input(s): PROBNP in the last 8760 hours. HbA1C: No results for input(s): HGBA1C in the last 72 hours. CBG: No results for input(s): GLUCAP in the last 168 hours. Lipid Profile: No results for input(s): CHOL, HDL, LDLCALC, TRIG, CHOLHDL, LDLDIRECT in the last 72 hours. Thyroid Function Tests: No results for input(s): TSH, T4TOTAL, FREET4, T3FREE, THYROIDAB in the last 72 hours. Anemia Panel: No results  for input(s): VITAMINB12, FOLATE, FERRITIN, TIBC, IRON, RETICCTPCT in the last 72 hours. Sepsis Labs: No results for input(s): PROCALCITON, LATICACIDVEN in the last 168 hours.  Recent Results (from the past 240 hour(s))  Novel Coronavirus, NAA ([facility])     Status: None   Collection Time: 10/25/19  9:06 AM   Specimen: Nasopharyngeal(NP) swabs in vial transport medium   NASOPHARYNGE  TESTING  Result Value Ref Range Status   SARS-CoV-2, NAA Not Detected Not Detected Final    Comment: This nucleic acid amplification test was developed and its performance characteristics determined by [employer]. Nucleic acid amplification tests include PCR and TMA. This test has not been FDA cleared or approved. This test has been authorized by FDA under an Emergency Use Authorization (EUA). This test is only authorized for the duration of time the declaration that circumstances exist justifying the authorization of the emergency use of in vitro diagnostic tests for detection of SARS-CoV-2 virus and/or diagnosis of COVID-19 infection under section 564(b)(1) of the Act, 21 U.S.C. [id number]) (1), unless the authorization is terminated or revoked sooner. When diagnostic testing is negative, the possibility of a false negative result should be considered in the context of a patient's recent exposures and the presence of clinical signs and symptoms consistent with COVID-19. An individual without symptoms of COVID-19 and who is not shedding SARS-CoV-2 virus would  expect to have a negative (not detected) result in this assay.   SARS CORONAVIRUS 2 (TAT 6-24 HRS) Nasopharyngeal Nasopharyngeal Swab     Status: None   Collection Time: 10/29/19  9:15 AM   Specimen: Nasopharyngeal Swab  Result Value Ref Range Status   SARS Coronavirus 2 NEGATIVE NEGATIVE Final    Comment: (NOTE) SARS-CoV-2 target nucleic acids are NOT DETECTED. The SARS-CoV-2 RNA is generally detectable in upper and lower respiratory  specimens during the acute phase of infection. Negative results do not preclude SARS-CoV-2 infection, do not rule out co-infections with other pathogens, and should not be used as the sole basis for treatment or other patient management decisions. Negative results must be combined with clinical observations, patient history, and epidemiological information. The expected result is Negative. Fact Sheet for Patients: SugarRoll.be Fact Sheet for Healthcare Providers: https://www.woods-mathews.com/ This test is not yet approved or cleared by the [place] FDA and  has been authorized for detection and/or diagnosis of SARS-CoV-2 by FDA under an Emergency Use Authorization (EUA). This EUA will remain  in effect (meaning this test can be used) for the duration of the COVID-19 declaration under Section 56 4(b)(1) of the Act, 21 U.S.C. section 360bbb-3(b)(1), unless the authorization is terminated or revoked sooner. Performed at [facility], [place] [address] [place], [place] [address]   SARS CORONAVIRUS 2 (TAT 6-24 HRS) Nasopharyngeal Nasopharyngeal Swab     Status: None   Collection Time: 11/01/19  8:11 PM  Specimen: Nasopharyngeal Swab  Result Value Ref Range Status   SARS Coronavirus 2 NEGATIVE NEGATIVE Final    Comment: (NOTE) SARS-CoV-2 target nucleic acids are NOT DETECTED. The SARS-CoV-2 RNA is generally detectable in upper and lower respiratory specimens during the acute phase of infection. Negative results do not preclude SARS-CoV-2 infection, do not rule out co-infections with other pathogens, and should not be used as the sole basis for treatment or other patient management decisions. Negative results must be combined with clinical observations, patient history, and epidemiological information. The expected result is Negative. Fact Sheet for Patients: SugarRoll.be Fact Sheet for Healthcare  Providers: https://www.woods-mathews.com/ This test is not yet approved or cleared by the [place] FDA and  has been authorized for detection and/or diagnosis of SARS-CoV-2 by FDA under an Emergency Use Authorization (EUA). This EUA will remain  in effect (meaning this test can be used) for the duration of the COVID-19 declaration under Section 56 4(b)(1) of the Act, 21 U.S.C. section 360bbb-3(b)(1), unless the authorization is terminated or revoked sooner. Performed at [facility], [place] [address] [place], [place] [address]          Radiology Studies: DG Chest Portable 1 View  Result Date: 11/01/2019 CLINICAL DATA:  Shortness of breath, wheezing EXAM: PORTABLE CHEST 1 VIEW COMPARISON:  10/29/2019, chest CT 01/29/2016 FINDINGS: Heart is normal size. Peribronchial thickening and interstitial prominence throughout the lungs. This is stable since prior study and prior high-resolution chest CT from 2017 compatible with chronic lung disease/fibrosis. No acute confluent opacity or effusion. No acute bony abnormality. IMPRESSION: Chronic interstitial changes again noted, stable. No acute cardiopulmonary process. Electronically Signed   By: Rolm Baptise M.D.   On: 11/01/2019 20:14        Scheduled Meds: . aspirin EC  81 mg Oral Daily  . atorvastatin  40 mg Oral QHS  . clopidogrel  75 mg Oral Daily  . enoxaparin (LOVENOX) injection  40 mg Subcutaneous Q24H  . ipratropium-albuterol  3 mL Nebulization Q6H  . magnesium oxide  400 mg Oral Daily  . methylPREDNISolone (SOLU-MEDROL) injection  40 mg Intravenous Daily  . pantoprazole  40 mg Oral BID  . potassium chloride SA  20 mEq Oral Q1500   Continuous Infusions:   LOS: 0 days    Time spent: 30 mins     Wyvonnia Dusky, MD [facility] Pager [phone]  If 7PM-7AM, please contact night-coverage [email or web address] Password [email or web address] 11/02/2019, 8:22 AM

## 2019-11-02 NOTE — ED Notes (Signed)
Patient is sleeping in room with no apparent acute distress.  Bed in lowest position, call light within reach.  SpO2 94% on 2L [place].  Will continue to monitor.

## 2019-11-02 NOTE — ED Notes (Addendum)
Await transport to 1A

## 2019-11-02 NOTE — Evaluation (Signed)
Physical Therapy Evaluation Patient Details Name: Austin Patterson MRN: [id number] DOB: 03/31/1956 Today's Date: 11/02/2019   History of Present Illness  Pt is 64 y.o. male with medical history significant of CAD s/p stent, COPD on 2L, IPF, hypertension, and GERD who presents with concerns of anaphylaxis.     Clinical Impression  Patient alert, oriented, behavior WFLs. The patient was on 3L, usually on 2L. Reported living alone in a one story home, no recent falls, independent.  The patient demonstrated bed mobility and transfers with mod I. Ambulated ~248ft with supervision, pt able to push oxygen tank while ambulating. Pt did exhibit increased SOB and reported increased work of breathing after ambulation, needed supine rest break. Overall the patient demonstrated near return to baseline but would benefit from home health PT to maximize mobility, activity tolerance and endurance. Recommendation is HHPT.    Follow Up Recommendations Home health PT    Equipment Recommendations  None recommended by PT    Recommendations for Other Services       Precautions / Restrictions Precautions Precautions: Fall Precaution Comments: watch O2 Restrictions Weight Bearing Restrictions: No      Mobility  Bed Mobility Overal bed mobility: Modified Independent                Transfers Overall transfer level: Modified independent Equipment used: None                Ambulation/Gait Ambulation/Gait assistance: Supervision Gait Distance (Feet): 200 Feet Assistive device: None   Gait velocity: decreased   General Gait Details: Pt able to roll oxygen tank behind him while ambulating. no LOB noted, pt's main complaint is SOB.  Stairs            Wheelchair Mobility    Modified Rankin (Stroke Patients Only)       Balance Overall balance assessment: Needs assistance Sitting-balance support: Feet supported Sitting balance-Leahy Scale: Good       Standing  balance-Leahy Scale: Good                               Pertinent Vitals/Pain Pain Assessment: No/denies pain    Home Living Family/patient expects to be discharged to:: Private residence Living Arrangements: Alone Available Help at Discharge: Neighbor Type of Home: House Home Access: Stairs to enter;Ramped entrance Entrance Stairs-Rails: Right;Left;Can reach both Entrance Stairs-Number of Steps: 3 Home Layout: Laundry or work area in [place]: Marine scientist - single point Additional Comments: Pt has O2 at home but takes it off to go downstairs to basement to get laundry    Prior Function Level of Independence: Independent               Hand Dominance   Dominant Hand: Right    Extremity/Trunk Assessment   Upper Extremity Assessment Upper Extremity Assessment: Generalized weakness    Lower Extremity Assessment Lower Extremity Assessment: Generalized weakness    Cervical / Trunk Assessment Cervical / Trunk Assessment: Normal  Communication   Communication: No difficulties  Cognition Arousal/Alertness: Awake/alert Behavior During Therapy: WFL for tasks assessed/performed Overall Cognitive Status: Within Functional Limits for tasks assessed                                        General Comments      Exercises     Assessment/Plan  PT Assessment Patient needs continued PT services  PT Problem List Decreased strength;Decreased mobility;Decreased activity tolerance;Decreased balance;Cardiopulmonary status limiting activity       PT Treatment Interventions Therapeutic exercise;Gait training;Balance training;Stair training;Neuromuscular re-education;Functional mobility training;Therapeutic activities;Patient/family education    PT Goals (Current goals can be found in the Care Plan section)  Acute Rehab PT Goals Patient Stated Goal: to get his strength and breathing back to normal PT Goal Formulation: With  patient Time For Goal Achievement: 11/16/19 Potential to Achieve Goals: Good    Frequency Min 2X/week   Barriers to discharge        Co-evaluation               AM-PAC PT "6 Clicks" Mobility  Outcome Measure Help needed turning from your back to your side while in a flat bed without using bedrails?: None Help needed moving from lying on your back to sitting on the side of a flat bed without using bedrails?: None Help needed moving to and from a bed to a chair (including a wheelchair)?: None Help needed standing up from a chair using your arms (e.g., wheelchair or bedside chair)?: None Help needed to walk in hospital room?: None Help needed climbing 3-5 steps with a railing? : A Little 6 Click Score: 23    End of Session Equipment Utilized During Treatment: Gait belt;Oxygen(3L) Activity Tolerance: Patient tolerated treatment well Patient left: in bed;with call bell/phone within reach Nurse Communication: Mobility status PT Visit Diagnosis: Other abnormalities of gait and mobility (R26.89)    Time: [id number] PT Time Calculation (min) (ACUTE ONLY): 23 min   Charges:   PT Evaluation $PT Eval Low Complexity: 1 Low PT Treatments $Therapeutic Exercise: 8-22 mins        Lieutenant Diego PT, DPT 3:56 PM,11/02/19

## 2019-11-02 NOTE — Progress Notes (Signed)
   11/02/19 1736  Clinical Encounter Type  Visited With Patient  Visit Type Initial  Referral From Chaplain  Consult/Referral To Nurse  Spiritual Encounters  Spiritual Needs Literature;Prayer  Advance Directives (For Healthcare)  Does Patient Have a Medical Advance Directive? No  Would patient like information on creating a medical advance directive? Yes (Inpatient - patient requests chaplain consult to create a medical advance directive)  [place] Directives  Does Patient Have a Mental Health Advance Directive? No  Would patient like information on creating a mental health advance directive? No - Patient declined  Patient was in bed eating upon arrival.Chaplian gave education on advanced directives. Chaplain offered prayer.

## 2019-11-02 NOTE — ED Notes (Addendum)
Patient resting quietly in room with no apparent acute distress.  Blanket provided, patient repositioned, lights dimmed per pt request.   Bed in lowest position, call light within reach, lights dimmed per request.  Will continue to monitor.

## 2019-11-02 NOTE — ED Notes (Signed)
Patient sleeping in room with no apparent acute distress.  Bed in lowest position, call light within reach.  Will continue to monitor.  SpO2 95% on 2L [place].

## 2019-11-03 DIAGNOSIS — T782XXA Anaphylactic shock, unspecified, initial encounter: Secondary | ICD-10-CM | POA: Diagnosis not present

## 2019-11-03 DIAGNOSIS — J9621 Acute and chronic respiratory failure with hypoxia: Secondary | ICD-10-CM | POA: Diagnosis not present

## 2019-11-03 DIAGNOSIS — I10 Essential (primary) hypertension: Secondary | ICD-10-CM | POA: Diagnosis not present

## 2019-11-03 MED ORDER — PREDNISONE 20 MG PO TABS: 40.0000 mg | ORAL_TABLET | Freq: Every day | ORAL | 0 refills | Status: AC

## 2019-11-03 NOTE — TOC Initial Note (Addendum)
Transition of Care [facility]) - Initial/Assessment Note    Patient Details  Name: Austin Patterson MRN: [id number] Date of Birth: Feb 12, 1956  [facility] of [facility]) CM/SW Contact:    Candie Chroman, LCSW Phone Number: 11/03/2019, 8:54 AM  Clinical Narrative:  CSW met with patient, introduced role and explained that PT recommendations would be discussed. Patient is agreeable to HHPT if he doesn't have any out of pocket costs. [facility] unable to accept. Left message for [facility]. Patient does not use any assistive devices at home. He does use oxygen. Patient is from home alone. No further concerns. CSW encouraged patient to contact CSW as needed. CSW will continue to follow patient for support and facilitate return home when stable.               11:13 am: No response from [facility] yet. [facility] can't accept. [facility] is checking his insurance.  Expected Discharge Plan: [place] Barriers to Discharge: Continued Medical Work up   Patient Goals and CMS Choice        Expected Discharge Plan and Services Expected Discharge Plan: [place] Choice: [place] arrangements for the past 2 months: Single Family Home                                      Prior Living Arrangements/Services Living arrangements for the past 2 months: Single Family Home Lives with:: Self Patient language and need for interpreter reviewed:: Yes Do you feel safe going back to the place where you live?: Yes      Need for Family Participation in Patient Care: No (Comment) Care giver support system in place?: No (comment) Current home services: DME(Oxygen) Criminal Activity/Legal Involvement Pertinent to Current Situation/Hospitalization: No - Comment as needed  Activities of Daily Living Home Assistive Devices/Equipment: Oxygen, Blood pressure cuff, Shower chair with back ADL Screening (condition at time of admission) Patient's  cognitive ability adequate to safely complete daily activities?: Yes Is the patient deaf or have difficulty hearing?: No Does the patient have difficulty seeing, even when wearing glasses/contacts?: No Does the patient have difficulty concentrating, remembering, or making decisions?: No Patient able to express need for assistance with ADLs?: Yes Does the patient have difficulty dressing or bathing?: No Independently performs ADLs?: Yes (appropriate for developmental age) Does the patient have difficulty walking or climbing stairs?: No Weakness of Legs: None Weakness of Arms/Hands: None  Permission Sought/Granted Permission sought to share information with : Facility Art therapist granted to share information with : Yes, Verbal Permission Granted     Permission granted to share info w AGENCY: Home Health Agencies        Emotional Assessment Appearance:: Appears stated age Attitude/Demeanor/Rapport: Engaged, Gracious Affect (typically observed): Accepting, Appropriate, Calm, Pleasant Orientation: : Oriented to Self, Oriented to Place, Oriented to  Time, Oriented to Situation Alcohol / Substance Use: Not Applicable Psych Involvement: No (comment)  Admission diagnosis:  Anaphylaxis [T78.2XXA] Anaphylaxis, initial encounter [T78.2XXA] Patient Active Problem List   Diagnosis Date Noted  . Anaphylaxis 11/01/2019  . Hypotension 11/01/2019  . CAD (coronary artery disease) 11/01/2019  . H/O heart artery stent 11/01/2019  . Acute on chronic respiratory failure with hypoxia ([place])   . RUQ pain   . Essential hypertension   . IPF (idiopathic pulmonary fibrosis) ([place]) 08/30/2019  . Hypomagnesemia  03/31/2017  . Hiatal hernia   . Reflux esophagitis   . Hematemesis without nausea   . Hyponatremia 09/21/2016  . Multifocal pneumonia 12/09/2015  . COPD with acute exacerbation ([place]) 10/14/2015   PCP:  Laneta Simmers, NP Pharmacy:   [facility] [place] -  [address], [address], [place] [address] Phone: [phone] Fax: [phone]  [id number] [place] [address]Phone: [phone]: [address] [place]Phone: [phone]: [phone] [id number] [address]hone: [phone]: [phone]   Social Determinants of Health ([place]    Readmission Risk Interventions Readmission Risk Prevention Plan 09/02/2019  Transportation Screening Complete  PCP or Specialist Appt within 5-7 Days Complete  Home Care Screening Complete  Medication Review (RN CM) Complete  Some recent data might be hidden

## 2019-11-03 NOTE — Discharge Summary (Signed)
Physician Discharge Summary  Austin Patterson [id number] DOB: 02-10-1956 DOA: 11/01/2019  PCP: Laneta Simmers, NP  Admit date: 11/01/2019 Discharge date: 11/03/2019  Admitted From: home Disposition:  home  Recommendations for Outpatient Follow-up:  1. Follow up with PCP in 1-2 weeks 2. F/u pulmon in 1 week   Home Health: yes Equipment/Devices: chronic oxygen 2L   Discharge Condition: stable CODE STATUS: full  Diet recommendation: Heart Healthy    Brief/Interim Summary: HPI taken from [name]: Austin Patterson is a 64 y.o. male with medical history significant of CAD s/p stent, COPD on 2L, IPF, hypertension, and GERD who presents with concerns of anaphylaxis.   Pt saw his PCP today and was given Amoxillicin for sinusitis. Within 10 minutes of taking the antibiotic he noticed facial redness, swelling, itching, slurred speech and hand tremors. Also had increasing shortness of breath and chest pressure. Per EMS, pt found to be hypoxic to 80% on home 2L and had to be placed on NRB at 10L with saturation of 98%. BP of 90/60. He was given 50mg  benadryl, 0.3 epi x 2, 125 solumedrol; 1 albuterol, 20mg  pepcid and 350 NS.  He was given a 3rd epi in the ED and 20mg  pepcid. He also developed worsening shortness of breath after multiple epi and required Bipap. Eventually was able to wean down to 2L.   He was asymptomatic at the time of my evaluation.  No prior hx of allergies.  Quit tobacco 10yrs ago. Occasionally alcohol and denies illicit drugs.   WBC of 9.2, hemoglobin of 13.5. Na of 132, glucose of 166, creatinine of 1.17.  CXR negative.   Hospital course from [name] 1/14-1/15/21: Pt was found to have anaphylactic shock secondary to amoxillicin use. Pt was given epi, benadryl, solumedrol, pepcid, albuterol and IVFs. Pt was told not to ever take amoxicillin and penicillins ever again. Pt verbalized his understanding. Pt was able to be weaned back down to baseline oxygen  level at 2L [place] prior to d/c. Pt was d/c home w/ po steroids to finish the course at home. PT saw the pt and recommended home health which was set up by CM prior to d/c.  Discharge Diagnoses:  Principal Problem:   Anaphylaxis Active Problems:   COPD with acute exacerbation (HCC)   IPF (idiopathic pulmonary fibrosis) (HCC)   Essential hypertension   Acute on chronic respiratory failure with hypoxia (HCC)   Hypotension   CAD (coronary artery disease)   H/O heart artery stent  Anaphylaxis: initially presented with anaphylactic shock and acute hypoxic respiratory failure requiring BiPAP following intake of amoxicillin. S/p epi x3, 50 mg Benadryl, 125 Solu-Medrol, albuterol x2, 40 mg Pepcid and IV fluids. Continue on IV solu-medrol   Acute on chronichypoxic respiratory failure: secondary toCOPD exacerbation with hx of IPF. Continue on supplemental oxygen and currently at baseline of 2L  Leukocytosis: likely secondary to steroid use. Will continue to monitor  Hyperglycemia: no hx of DM. Likely secondary to steroid use. Will continue to monitor    Hypotension: resolved.  HTN: will continue on metoprolol & amlodipine  CAD: s/p stent. Continue aspirin and Plavix   Hyperlipidemia: continue statin  GERD: continue PPI  Generalized weakness: PT rec home health which has been set up by CM    Discharge Instructions  Discharge Instructions    Diet - low sodium heart healthy   Complete by: As directed    Discharge instructions   Complete by: As directed    F/u PCP  in 1-2 weeks; F/u pulmon in 1 week   Increase activity slowly   Complete by: As directed      Allergies as of 11/03/2019      Reactions   Amoxicillin Anaphylaxis      Medication List    TAKE these medications   AeroChamber MV inhaler Use as instructed   albuterol 108 (90 Base) MCG/ACT inhaler Commonly known as: VENTOLIN HFA Inhale 2 puffs into the lungs every 6 (six) hours as needed for wheezing or  shortness of breath.   amLODipine 10 MG tablet Commonly known as: NORVASC Take 1 tablet (10 mg total) by mouth daily.   aspirin EC 81 MG tablet Take 1 tablet (81 mg total) by mouth daily.   atorvastatin 40 MG tablet Commonly known as: LIPITOR Take 1 tablet (40 mg total) by mouth at bedtime.   calcium-vitamin D 500-200 MG-UNIT tablet Commonly known as: OSCAL WITH D Take 2 tablets by mouth daily with breakfast. What changed: when to take this   cetirizine 10 MG tablet Commonly known as: ZYRTEC Take 10 mg by mouth at bedtime.   clopidogrel 75 MG tablet Commonly known as: PLAVIX Take 1 tablet (75 mg total) by mouth daily.   Fluticasone-Salmeterol 250-50 MCG/DOSE Aepb Commonly known as: Advair Diskus Inhale 1 puff into the lungs 2 (two) times daily.   ipratropium-albuterol 0.5-2.5 (3) MG/3ML Soln Commonly known as: DUONEB Take 3 mLs by nebulization 4 (four) times daily as needed (wheezing/shortness of breath).   magnesium oxide 400 (241.3 Mg) MG tablet Commonly known as: MAG-OX Take 1 tablet (400 mg total) by mouth daily. What changed: when to take this   metoprolol succinate 50 MG 24 hr tablet Commonly known as: TOPROL-XL Take 1 tablet (50 mg total) by mouth daily.   multivitamin with minerals Tabs tablet Take 1 tablet by mouth daily. What changed: when to take this   pantoprazole 40 MG tablet Commonly known as: PROTONIX Take 1 tablet (40 mg total) by mouth 2 (two) times daily.   Potassium Chloride ER 20 MEQ Tbcr Take 20 mEq by mouth daily. What changed: when to take this   predniSONE 20 MG tablet Commonly known as: Deltasone Take 2 tablets (40 mg total) by mouth daily for 5 days. What changed:   medication strength  how much to take  when to take this      Follow-up Information    [facility] Follow up.   Why: They will start your home health physical therapy on Monday 1/18. Call their referral coordinator, Malachy Mood at [phone] if  you have any questions. Contact information: [place] [id number] [phone]          Allergies  Allergen Reactions  . Amoxicillin Anaphylaxis    Consultations:  none   Procedures/Studies: DG Chest 2 View  Result Date: 10/29/2019 CLINICAL DATA:  Chest pain. EXAM: CHEST - 2 VIEW COMPARISON:  None. FINDINGS: Lungs are hyperexpanded. Interstitial markings are diffusely coarsened with chronic features. The lungs are clear without focal pneumonia, edema, pneumothorax or pleural effusion. The cardiopericardial silhouette is within normal limits for size. The visualized bony structures of the thorax are intact. IMPRESSION: 1. No acute cardiopulmonary findings. 2. Hyperexpansion with underlying chronic interstitial lung disease. Electronically Signed   By: Misty Stanley M.D.   On: 10/29/2019 04:25   DG Chest Portable 1 View  Result Date: 11/01/2019 CLINICAL DATA:  Shortness of breath, wheezing EXAM: PORTABLE CHEST 1 VIEW COMPARISON:  10/29/2019,  chest CT 01/29/2016 FINDINGS: Heart is normal size. Peribronchial thickening and interstitial prominence throughout the lungs. This is stable since prior study and prior high-resolution chest CT from 2017 compatible with chronic lung disease/fibrosis. No acute confluent opacity or effusion. No acute bony abnormality. IMPRESSION: Chronic interstitial changes again noted, stable. No acute cardiopulmonary process. Electronically Signed   By: Rolm Baptise M.D.   On: 11/01/2019 20:14      Subjective: Pt c/o faitgue   Discharge Exam: Vitals:   11/03/19 0800 11/03/19 1050  BP: (!) 153/88 (!) 152/84  Pulse: 96 90  Resp: 17   Temp: 98.3 F (36.8 C)   SpO2: 99%    Vitals:   11/03/19 0550 11/03/19 0728 11/03/19 0800 11/03/19 1050  BP: (!) 164/95  (!) 153/88 (!) 152/84  Pulse: 94  96 90  Resp:   17   Temp:   98.3 F (36.8 C)   TempSrc:   Oral   SpO2: 92% 99% 99%   Weight:      Height:        General: Pt is alert,  awake, not in acute distress Cardiovascular:  S1/S2 +, no rubs, no gallops Respiratory: diminished breath sounds b/l, scattered intermittent wheezes b/l Abdominal: Soft, NT, ND, bowel sounds + Extremities: no edema, no cyanosis    The results of significant diagnostics from this hospitalization (including imaging, microbiology, ancillary and laboratory) are listed below for reference.     Microbiology: Recent Results (from the past 240 hour(s))  Novel Coronavirus, NAA ([facility])     Status: None   Collection Time: 10/25/19  9:06 AM   Specimen: Nasopharyngeal(NP) swabs in vial transport medium   NASOPHARYNGE  TESTING  Result Value Ref Range Status   SARS-CoV-2, NAA Not Detected Not Detected Final    Comment: This nucleic acid amplification test was developed and its performance characteristics determined by [employer]. Nucleic acid amplification tests include PCR and TMA. This test has not been FDA cleared or approved. This test has been authorized by FDA under an Emergency Use Authorization (EUA). This test is only authorized for the duration of time the declaration that circumstances exist justifying the authorization of the emergency use of in vitro diagnostic tests for detection of SARS-CoV-2 virus and/or diagnosis of COVID-19 infection under section 564(b)(1) of the Act, 21 U.S.C. PT:2852782) (1), unless the authorization is terminated or revoked sooner. When diagnostic testing is negative, the possibility of a false negative result should be considered in the context of a patient's recent exposures and the presence of clinical signs and symptoms consistent with COVID-19. An individual without symptoms of COVID-19 and who is not shedding SARS-CoV-2 virus would  expect to have a negative (not detected) result in this assay.   SARS CORONAVIRUS 2 (TAT 6-24 HRS) Nasopharyngeal Nasopharyngeal Swab     Status: None   Collection Time: 10/29/19  9:15 AM   Specimen:  Nasopharyngeal Swab  Result Value Ref Range Status   SARS Coronavirus 2 NEGATIVE NEGATIVE Final    Comment: (NOTE) SARS-CoV-2 target nucleic acids are NOT DETECTED. The SARS-CoV-2 RNA is generally detectable in upper and lower respiratory specimens during the acute phase of infection. Negative results do not preclude SARS-CoV-2 infection, do not rule out co-infections with other pathogens, and should not be used as the sole basis for treatment or other patient management decisions. Negative results must be combined with clinical observations, patient history, and epidemiological information. The expected result is Negative. Fact Sheet for Patients: SugarRoll.be Fact Sheet  for Healthcare Providers: https://www.woods-mathews.com/ This test is not yet approved or cleared by the [place] and  has been authorized for detection and/or diagnosis of SARS-CoV-2 by FDA under an Emergency Use Authorization (EUA). This EUA will remain  in effect (meaning this test can be used) for the duration of the COVID-19 declaration under Section 56 4(b)(1) of the Act, 21 U.S.C. section 360bbb-3(b)(1), unless the authorization is terminated or revoked sooner. Performed at [facility], [place] [address], [place], [place] [address]   SARS CORONAVIRUS 2 (TAT 6-24 HRS) Nasopharyngeal Nasopharyngeal Swab     Status: None   Collection Time: 11/01/19  8:11 PM   Specimen: Nasopharyngeal Swab  Result Value Ref Range Status   SARS Coronavirus 2 NEGATIVE NEGATIVE Final    Comment: (NOTE) SARS-CoV-2 target nucleic acids are NOT DETECTED. The SARS-CoV-2 RNA is generally detectable in upper and lower respiratory specimens during the acute phase of infection. Negative results do not preclude SARS-CoV-2 infection, do not rule out co-infections with other pathogens, and should not be used as the sole basis for treatment or other patient management  decisions. Negative results must be combined with clinical observations, patient history, and epidemiological information. The expected result is Negative. Fact Sheet for Patients: SugarRoll.be Fact Sheet for Healthcare Providers: https://www.woods-mathews.com/ This test is not yet approved or cleared by the [place] FDA and  has been authorized for detection and/or diagnosis of SARS-CoV-2 by FDA under an Emergency Use Authorization (EUA). This EUA will remain  in effect (meaning this test can be used) for the duration of the COVID-19 declaration under Section 56 4(b)(1) of the Act, 21 U.S.C. section 360bbb-3(b)(1), unless the authorization is terminated or revoked sooner. Performed at [facility], [place] [address], [place], [place] [address]      Labs: BNP (last 3 results) Recent Labs    08/30/19 0910  BNP [id number]   Basic Metabolic Panel: Recent Labs  Lab 10/29/19 0404 11/01/19 1750 11/02/19 0441  NA 125* 132* 135  K 3.7 3.9 4.1  CL 89* 93* 96*  CO2 20* 27 25  GLUCOSE 114* 166* 190*  BUN 16 24* 24*  CREATININE 0.98 1.17 0.89  CALCIUM 9.1 9.4 9.4   Liver Function Tests: Recent Labs  Lab 10/29/19 0404  AST 40  ALT 23  ALKPHOS 58  BILITOT 1.1  PROT 7.6  ALBUMIN 4.4   Recent Labs  Lab 10/29/19 0404  LIPASE 34   No results for input(s): AMMONIA in the last 168 hours. CBC: Recent Labs  Lab 10/29/19 0404 11/01/19 1750 11/02/19 0441  WBC 7.7 9.2 16.8*  NEUTROABS  --  6.5  --   HGB 11.9* 13.5 11.7*  HCT 34.5* 41.2 34.7*  MCV 89.4 93.6 93.5  PLT 294 400 290   Cardiac Enzymes: No results for input(s): CKTOTAL, CKMB, CKMBINDEX, TROPONINI in the last 168 hours. BNP: Invalid input(s): POCBNP CBG: No results for input(s): GLUCAP in the last 168 hours. D-Dimer No results for input(s): DDIMER in the last 72 hours. Hgb A1c No results for input(s): HGBA1C in the last 72 hours. Lipid Profile No results for  input(s): CHOL, HDL, LDLCALC, TRIG, CHOLHDL, LDLDIRECT in the last 72 hours. Thyroid function studies No results for input(s): TSH, T4TOTAL, T3FREE, THYROIDAB in the last 72 hours.  Invalid input(s): FREET3 Anemia work up No results for input(s): VITAMINB12, FOLATE, FERRITIN, TIBC, IRON, RETICCTPCT in the last 72 hours. Urinalysis    Component Value Date/Time   COLORURINE  YELLOW (A) 12/24/2017 1750   APPEARANCEUR HAZY (A) 12/24/2017 1750   LABSPEC 1.017 12/24/2017 1750   PHURINE 5.0 12/24/2017 1750   GLUCOSEU NEGATIVE 12/24/2017 1750   HGBUR NEGATIVE 12/24/2017 1750   BILIRUBINUR NEGATIVE 12/24/2017 1750   KETONESUR NEGATIVE 12/24/2017 1750   PROTEINUR 30 (A) 12/24/2017 1750   NITRITE NEGATIVE 12/24/2017 1750   LEUKOCYTESUR NEGATIVE 12/24/2017 1750   Sepsis Labs Invalid input(s): PROCALCITONIN,  WBC,  LACTICIDVEN Microbiology Recent Results (from the past 240 hour(s))  Novel Coronavirus, NAA ([facility])     Status: None   Collection Time: 10/25/19  9:06 AM   Specimen: Nasopharyngeal(NP) swabs in vial transport medium   NASOPHARYNGE  TESTING  Result Value Ref Range Status   SARS-CoV-2, NAA Not Detected Not Detected Final    Comment: This nucleic acid amplification test was developed and its performance characteristics determined by [employer] and [employer]. Nucleic acid amplification tests include PCR and TMA. This test has not been FDA cleared or approved. This test has been authorized by FDA under an Emergency Use Authorization (EUA). This test is only authorized for the duration of time the declaration that circumstances exist justifying the authorization of the emergency use of in vitro diagnostic tests for detection of SARS-CoV-2 virus and/or diagnosis of COVID-19 infection under section 564(b)(1) of the Act, 21 U.S.C. [id number]) (1), unless the authorization is terminated or revoked sooner. When diagnostic testing is negative, the possibility of a false negative result  should be considered in the context of a patient's recent exposures and the presence of clinical signs and symptoms consistent with COVID-19. An individual without symptoms of COVID-19 and who is not shedding SARS-CoV-2 virus would  expect to have a negative (not detected) result in this assay.   SARS CORONAVIRUS 2 (TAT 6-24 HRS) Nasopharyngeal Nasopharyngeal Swab     Status: None   Collection Time: 10/29/19  9:15 AM   Specimen: Nasopharyngeal Swab  Result Value Ref Range Status   SARS Coronavirus 2 NEGATIVE NEGATIVE Final    Comment: (NOTE) SARS-CoV-2 target nucleic acids are NOT DETECTED. The SARS-CoV-2 RNA is generally detectable in upper and lower respiratory specimens during the acute phase of infection. Negative results do not preclude SARS-CoV-2 infection, do not rule out co-infections with other pathogens, and should not be used as the sole basis for treatment or other patient management decisions. Negative results must be combined with clinical observations, patient history, and epidemiological information. The expected result is Negative. Fact Sheet for Patients: SugarRoll.be Fact Sheet for Healthcare Providers: https://www.woods-mathews.com/ This test is not yet approved or cleared by the [place] FDA and  has been authorized for detection and/or diagnosis of SARS-CoV-2 by FDA under an Emergency Use Authorization (EUA). This EUA will remain  in effect (meaning this test can be used) for the duration of the COVID-19 declaration under Section 56 4(b)(1) of the Act, 21 U.S.C. section 360bbb-3(b)(1), unless the authorization is terminated or revoked sooner. Performed at [facility], [place] [address] [place], [place] [address]   SARS CORONAVIRUS 2 (TAT 6-24 HRS) Nasopharyngeal Nasopharyngeal Swab     Status: None   Collection Time: 11/01/19  8:11 PM   Specimen: Nasopharyngeal Swab  Result Value Ref Range Status   SARS  Coronavirus 2 NEGATIVE NEGATIVE Final    Comment: (NOTE) SARS-CoV-2 target nucleic acids are NOT DETECTED. The SARS-CoV-2 RNA is generally detectable in upper and lower respiratory specimens during the acute phase of infection. Negative results do not preclude SARS-CoV-2 infection, do not  rule out co-infections with other pathogens, and should not be used as the sole basis for treatment or other patient management decisions. Negative results must be combined with clinical observations, patient history, and epidemiological information. The expected result is Negative. Fact Sheet for Patients: SugarRoll.be Fact Sheet for Healthcare Providers: https://www.woods-mathews.com/ This test is not yet approved or cleared by the [place] FDA and  has been authorized for detection and/or diagnosis of SARS-CoV-2 by FDA under an Emergency Use Authorization (EUA). This EUA will remain  in effect (meaning this test can be used) for the duration of the COVID-19 declaration under Section 56 4(b)(1) of the Act, 21 U.S.C. section 360bbb-3(b)(1), unless the authorization is terminated or revoked sooner. Performed at [facility], [place] [address], [place], [place] [address]      Time coordinating discharge: Over 30 minutes  SIGNED:   Wyvonnia Dusky, MD  [facility] 11/03/2019, 11:51 AM Pager   If 7PM-7AM, please contact night-coverage [email or web address] Password [email or web address]

## 2019-11-03 NOTE — Care Management Obs Status (Signed)
[place] NOTIFICATION   Patient Details  Name: Austin Patterson MRN: [id number] Date of Birth: 1956-07-09   Medicare Observation Status Notification Given:  Yes    Candie Chroman, LCSW 11/03/2019, 8:47 AM

## 2019-11-03 NOTE — TOC Transition Note (Signed)
Transition of Care [facility]) - CM/SW Discharge Note   Patient Details  Name: Austin Patterson MRN: [id number] Date of Birth: 14-Feb-1956  [facility] CM/SW Contact:  Candie Chroman, LCSW Phone Number: 11/03/2019, 11:51 AM   Clinical Narrative: Patient has orders to discharge home today. He has been accepted by [facility] for HHPT and they will start services on Monday. Patient is aware and agreeable. No further concerns. CSW signing off.    Final next level of care: [place] Barriers to Discharge: Barriers Resolved   Patient Goals and CMS Choice     Choice offered to / list presented to : Patient  Discharge Placement                    Patient and family notified of of transfer: 11/03/19  Discharge Plan and Services     Post Acute Care Choice: [place]: PT [place]: [place] Date [place]: 11/03/19   Representative spoke with at [place]: [place] Determinants of Health ([place]) Interventions     Readmission Risk Interventions Readmission Risk Prevention Plan 09/02/2019  Transportation Screening Complete  PCP or Specialist Appt within 5-7 Days Complete  Home Care Screening Complete  Medication Review (RN CM) Complete  Some recent data might be hidden

## 2019-11-03 NOTE — Progress Notes (Signed)
Received MD order to discharge patient to home with [facility], reviewed discharge instructions, homes meds, prescriptions and follow up appointments with patient and patient verbalized understanding

## 2019-11-06 ENCOUNTER — Other Ambulatory Visit: Payer: Self-pay | Admitting: Emergency Medicine

## 2019-11-06 ENCOUNTER — Other Ambulatory Visit
Admission: RE | Admit: 2019-11-06 | Discharge: 2019-11-06 | Disposition: A | Discharge status: Home / Self Care | Payer: Medicare HMO | Source: Ambulatory Visit | Attending: Pulmonary Disease | Admitting: Pulmonary Disease

## 2019-11-06 DIAGNOSIS — B4481 Allergic bronchopulmonary aspergillosis: Secondary | ICD-10-CM | POA: Diagnosis present

## 2019-11-08 ENCOUNTER — Other Ambulatory Visit: Payer: Self-pay

## 2019-11-08 ENCOUNTER — Ambulatory Visit: Payer: Medicare HMO | Admitting: Pulmonary Disease

## 2019-11-08 ENCOUNTER — Encounter: Payer: Self-pay | Admitting: Pulmonary Disease

## 2019-11-08 VITALS — BP 124/70 | HR 92 | Temp 97.4°F | Ht 67.0 in | Wt 215.2 lb

## 2019-11-08 DIAGNOSIS — B4481 Allergic bronchopulmonary aspergillosis: Secondary | ICD-10-CM

## 2019-11-08 DIAGNOSIS — J8410 Pulmonary fibrosis, unspecified: Secondary | ICD-10-CM | POA: Diagnosis not present

## 2019-11-08 DIAGNOSIS — J9611 Chronic respiratory failure with hypoxia: Secondary | ICD-10-CM | POA: Diagnosis not present

## 2019-11-08 DIAGNOSIS — J4550 Severe persistent asthma, uncomplicated: Secondary | ICD-10-CM | POA: Diagnosis not present

## 2019-11-08 DIAGNOSIS — J849 Interstitial pulmonary disease, unspecified: Secondary | ICD-10-CM

## 2019-11-08 MED ORDER — PREDNISONE 10 MG PO TABS: 10.0000 mg | ORAL_TABLET | Freq: Every day | ORAL | 3 refills | Status: DC

## 2019-11-08 NOTE — Patient Instructions (Signed)
Will make you an appointment for 2 months time  We will call you as soon as we have Xolair set up just need the final blood test that you had done that should be coming in the next day or so  Continue prednisone 10 mg daily once you finish your taper, we [facility] the prescription to [facility] in [place] you will be on prednisone until you start your Xolair.

## 2019-11-08 NOTE — Progress Notes (Signed)
Subjective:    Patient ID: Austin Patterson, male    DOB: [birth year], 64 y.o.   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  He has been noted to have severely elevated IgE.  He also has a hypersensitivity pneumonitis panel positive for Aspergillus.  He has not had subsequent blood work ordered.  He presents today because he had an anaphylactic reaction to ampicillin on 13 January.  Since then he has been on higher doses of prednisone.  He has not had any fevers, chills or sweats then.  No other new issue.  We are in the process of trying to get Xolair approved for him.  He is being somewhat insistent that he cannot go to [facility] for infusions so we may need to use a different biologic that he can administer himself.  He understands however that his first visit would have to be in [place] for infusion.  PFTs performed 04/20/2019 showed an FEV1 of 1.23 L or 39% predicted with a 12% change postbronchodilator there was a significant air trapping and diffusion capacity was 55%.  This showed significant decline from his function in April 2017.  CT scans of the chest have shown fibrosis consistent with hypersensitivity pneumonitis and/or postinflammatory/infectious etiology.  He has severe bullous disease that has worsened significantly over the course of the years.  Review of Systems  A 10 point review of systems was performed and it is as noted above otherwise negative.  Medication list was reviewed   Objective:   Physical Exam BP 124/70 (BP Location: Left Arm, Patient Position: Sitting, Cuff Size: Large)   Pulse 92   Temp (!) 97.4 F (36.3 C) (Temporal)   Ht 5\' 7"  (1.702 m)   Wt 215 lb 3.2 oz  (97.6 kg)   SpO2 95% Comment: on ra  BMI 33.71 kg/m    GENERAL: This is a overweight gentleman, he looks chronically ill.  Chronic use of accessories.  On nasal cannula O2.  Ambulatory with assistance of a cane. HEAD: Normocephalic, atraumatic.  EYES: Pupils equal, round, reactive to light.  No scleral icterus.  MOUTH: Nose/mouth/throat not examined due to masking requirements for COVID 19. NECK: Supple. No thyromegaly. Trachea midline. No JVD.  No adenopathy. PULMONARY: Increased AP diameter, significant kyphosis, distant breath sounds, coarse. CARDIOVASCULAR: S1 and S2. Regular rate and rhythm.  Distant heart tones, no murmurs appreciated. GASTROINTESTINAL: Benign. MUSCULOSKELETAL: No joint deformity, no clubbing, no edema.  Significant kyphosis noted. NEUROLOGIC: No focal deficit.  Awake, alert, speech is fluent SKIN: Intact,warm,dry.  No overt rashes. PSYCH: Mood appears irascible, behavior normal.      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 Chronic respiratory failure with hypoxia (HCC)  J96.11     Discussion: Very complex gentleman with chronic obstructive pulmonary disease and a component of asthma overlap.  His fibrosis pattern on CT is consistent with either hypersensitivity pneumonitis and/or postinflammatory fibrosis.  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  Though a small percentage of MZ patients do develop deficiency  the patient has not demonstrated alpha levels below 57 L.  All of his levels have been above this.  It appears that there was consideration of discontinuing this therapy as he had continued to deteriorate despite alpha-1 augmentation.  He has not had repeat blood work that I have requested and I recommend  that he have this done.  We are in the process of getting either Xolair or Dupixent approved for the patient.  He will continue Advair and as needed DuoNeb.  He had discontinued Spiriva on his own.   Renold Don, MD [place] [facility]  *This note was dictated using voice recognition software/Dragon.  Despite best efforts to proofread, errors can occur which can change the meaning.  Any change was purely unintentional.   C. Derrill Kay, MD  PCCM    *This note was dictated using voice recognition software/Dragon.  Despite best efforts to proofread, errors can occur which can change the meaning.  Any change was purely unintentional.

## 2019-11-10 LAB — ALPHA-1 ANTITRYPSIN PHENOTYPE: A-1 Antitrypsin, Ser: 92 mg/dL — ABNORMAL LOW (ref 101–187)

## 2019-11-15 ENCOUNTER — Telehealth: Payer: Self-pay | Admitting: Pulmonary Disease

## 2019-11-15 NOTE — Telephone Encounter (Signed)
Called and spoke to pt, who is requesting lab results from 11/06/2019.  Dr. Patsey Berthold, please advise. Thanks

## 2019-11-15 NOTE — Telephone Encounter (Signed)
IgE and ABPA study still pending. Alpha back relatively stable from prior.  Working on [facility] paperwork completed.

## 2019-11-15 NOTE — Telephone Encounter (Signed)
Pt is aware of results and voiced his understanding. Nothing further is needed.  

## 2019-11-15 NOTE — Telephone Encounter (Signed)
Lm for pt

## 2019-11-15 NOTE — Telephone Encounter (Signed)
Xolair forms completed.  If the patient is unable to travel to [facility] may consider Dupixent.  However he still would have to go to [facility] for the first 2-3 doses.

## 2019-11-16 NOTE — Telephone Encounter (Signed)
Spoke to pt and relayed below message.  Explained to pt that there is not an infusion clinic here in [place] that can give xolair. Pt stated that it would be hard for him to travel to [place] even 2-3 times. Pt states that he would like to think about this and give our office a call back next week.   Will route to Dr. Patsey Berthold to make aware.

## 2019-11-22 NOTE — Telephone Encounter (Signed)
Great! I think this will help him greatly!

## 2019-11-22 NOTE — Telephone Encounter (Signed)
Spoke to pt, who stated he has arranged transportation to go to [place] 2-3 times for Dupixent injection. Pt is aware that forms will need to be completed and signed for Dupixent. pt will come by our office on Friday to sign forms. Pt is also requesting a print out of last two alpha labs. Pt is aware that copy of labs will be available for pickup when he comes by on Friday.   Will route to Dr. Patsey Berthold to make aware.

## 2019-11-28 ENCOUNTER — Telehealth: Payer: Self-pay | Admitting: Pulmonary Disease

## 2019-11-28 NOTE — Telephone Encounter (Signed)
Received new start form for Dupixent from Dr. Patsey Berthold received via fax from [place] office today. Form was signed and dated 11/27/19. Form and all required information was faxed by Lattie Haw on 11/28/19. Patient is wanting home injections.  Will sign off and await decision.

## 2019-11-29 NOTE — Telephone Encounter (Signed)
Forms have been completed and faxed to our [place] office.  Will route to injection pool to make them aware that pt would like to do injections at home.

## 2019-11-29 NOTE — Telephone Encounter (Signed)
See other note

## 2019-12-01 NOTE — Telephone Encounter (Signed)
Medication name and strength: Dupixent 300mg /29ml Provider: Dr. Patsey Berthold Pharmacy: [facility] Patient insurance [id number]   Was the PA started on CMM?  yes If yes, please enter the Key: [id number] Timeframe for approval/denial: 24-72 hours  Patient for home injections

## 2019-12-01 NOTE — Telephone Encounter (Signed)
Fax received from [place].  Dupixent PA approved 10/20/19-10/18/20. Approval faxed to [place] [employer] [facility]. Will follow up.

## 2019-12-05 MED ORDER — DUPIXENT 300 MG/2ML ~~LOC~~ SOAJ: 2.0000 mL | SUBCUTANEOUS | 11 refills | Status: DC

## 2019-12-05 MED ORDER — EPINEPHRINE 0.3 MG/0.3ML IJ SOAJ: 0.3000 mg | Freq: Once | INTRAMUSCULAR | 11 refills | Status: AC

## 2019-12-05 MED ORDER — DUPIXENT 300 MG/2ML ~~LOC~~ SOAJ: 600.0000 mg | Freq: Once | SUBCUTANEOUS | 0 refills | Status: DC

## 2019-12-05 NOTE — Telephone Encounter (Signed)
Called [facility] to schedule shipment. [facility] is needing to [facility] to complete patient enrollment. Dupixent loading and maintenance dose sent to [facility]. Called and spoke with Patient.  [facility] contact number given. Patient stated understanding to contact [facility]. Patient aware of Epipen/office protocol.  Epipen prescription sent to [facility], per Patient request.  Patient aware of self injections/demonstration in [place] office. Patient stated he would call office if he is given a shipment date, or receive Dupixent.

## 2019-12-12 NOTE — Telephone Encounter (Signed)
Spoke with pt. States that he has been in contact with [facility] about his medication. His copay is currently $250 for a 30 day supply, pt states that he can't afford that. He is working with [facility] to try to get some copay assistance. Pt states that [facility] advised him that this could take up to a few weeks to find assistance. Pt states that he will contact us when he if/when he gets assistance and is ready to start the medication.

## 2019-12-25 ENCOUNTER — Other Ambulatory Visit: Payer: Self-pay | Admitting: Pulmonary Disease

## 2020-01-08 ENCOUNTER — Telehealth: Payer: Self-pay | Admitting: Pulmonary Disease

## 2020-01-09 ENCOUNTER — Ambulatory Visit: Payer: Medicare HMO | Admitting: Pulmonary Disease

## 2020-01-09 NOTE — Telephone Encounter (Signed)
[facility]oke with [facility] to set up dupixent delivery.  Dupixent was shipped to Patient home address 12/26/19. ATC Patient.  LM for Patient to schedule new start Dupixent, review 2 hour wait, and bring epi pen.

## 2020-01-09 NOTE — Telephone Encounter (Signed)
ATC Patient.  LM to call back to schedule first Dupixent injection.

## 2020-01-10 NOTE — Telephone Encounter (Signed)
LMTCB x3 for pt.  

## 2020-01-11 ENCOUNTER — Telehealth: Payer: Self-pay | Admitting: Pulmonary Disease

## 2020-01-11 NOTE — Telephone Encounter (Signed)
LMTCB x1 for pt.  

## 2020-01-11 NOTE — Telephone Encounter (Signed)
See telephone encounter (01/08/2020)

## 2020-01-11 NOTE — Telephone Encounter (Signed)
Pt returning call.  Had his phone on DND.  Gave him our number to add to his contacts to hopefully be able to reach him.  Please advise.  [phone]

## 2020-01-11 NOTE — Telephone Encounter (Signed)
Spoke with pt. Pt is aware of our office policy when it comes to his first injection >> 2 hours wait, Epipen. Rx for Epipen is not needed as he already has one. Appointment has been scheduled for 01/17/2020 at 1330. Nothing further needed at this time.

## 2020-01-15 ENCOUNTER — Telehealth: Payer: Self-pay | Admitting: Pulmonary Disease

## 2020-01-16 NOTE — Telephone Encounter (Signed)
LMTCB x1 for pt.  

## 2020-01-16 NOTE — Telephone Encounter (Signed)
Spoke with pt. I have answered all of his questions in regards to his [place]. Nothing further was needed.

## 2020-01-17 ENCOUNTER — Other Ambulatory Visit: Payer: Self-pay

## 2020-01-17 ENCOUNTER — Ambulatory Visit: Payer: Medicare HMO

## 2020-01-18 ENCOUNTER — Telehealth: Payer: Self-pay | Admitting: Pulmonary Disease

## 2020-01-18 NOTE — Telephone Encounter (Signed)
Spoke with pt. He has been scheduled for an injection on 01/24/2020 at 1000. Nothing further needed.

## 2020-01-19 LAB — MISC LABCORP TEST (SEND OUT): Labcorp test code: 819248

## 2020-01-24 ENCOUNTER — Inpatient Hospital Stay: Payer: Medicare HMO

## 2020-01-24 ENCOUNTER — Inpatient Hospital Stay
Admission: EM | Admit: 2020-01-24 | Discharge: 2020-01-29 | DRG: 190 | Disposition: A | Discharge status: Home Health | Payer: Medicare HMO | Attending: Family Medicine | Admitting: Family Medicine

## 2020-01-24 ENCOUNTER — Other Ambulatory Visit: Payer: Self-pay

## 2020-01-24 ENCOUNTER — Ambulatory Visit: Payer: Medicare HMO

## 2020-01-24 ENCOUNTER — Emergency Department: Payer: Medicare HMO

## 2020-01-24 DIAGNOSIS — Z6833 Body mass index (BMI) 33.0-33.9, adult: Secondary | ICD-10-CM

## 2020-01-24 DIAGNOSIS — Z955 Presence of coronary angioplasty implant and graft: Secondary | ICD-10-CM

## 2020-01-24 DIAGNOSIS — K2100 Gastro-esophageal reflux disease with esophagitis, without bleeding: Secondary | ICD-10-CM

## 2020-01-24 DIAGNOSIS — Z87891 Personal history of nicotine dependence: Secondary | ICD-10-CM | POA: Diagnosis not present

## 2020-01-24 DIAGNOSIS — E785 Hyperlipidemia, unspecified: Secondary | ICD-10-CM | POA: Diagnosis present

## 2020-01-24 DIAGNOSIS — Z7289 Other problems related to lifestyle: Secondary | ICD-10-CM | POA: Diagnosis not present

## 2020-01-24 DIAGNOSIS — E871 Hypo-osmolality and hyponatremia: Secondary | ICD-10-CM | POA: Diagnosis present

## 2020-01-24 DIAGNOSIS — J441 Chronic obstructive pulmonary disease with (acute) exacerbation: Secondary | ICD-10-CM | POA: Diagnosis not present

## 2020-01-24 DIAGNOSIS — J4489 Other specified chronic obstructive pulmonary disease: Secondary | ICD-10-CM | POA: Diagnosis present

## 2020-01-24 DIAGNOSIS — F1010 Alcohol abuse, uncomplicated: Secondary | ICD-10-CM | POA: Diagnosis present

## 2020-01-24 DIAGNOSIS — I2510 Atherosclerotic heart disease of native coronary artery without angina pectoris: Secondary | ICD-10-CM | POA: Diagnosis present

## 2020-01-24 DIAGNOSIS — J8410 Pulmonary fibrosis, unspecified: Secondary | ICD-10-CM | POA: Diagnosis present

## 2020-01-24 DIAGNOSIS — J9621 Acute and chronic respiratory failure with hypoxia: Secondary | ICD-10-CM | POA: Diagnosis present

## 2020-01-24 DIAGNOSIS — K760 Fatty (change of) liver, not elsewhere classified: Secondary | ICD-10-CM | POA: Diagnosis present

## 2020-01-24 DIAGNOSIS — Z9981 Dependence on supplemental oxygen: Secondary | ICD-10-CM | POA: Diagnosis not present

## 2020-01-24 DIAGNOSIS — J439 Emphysema, unspecified: Secondary | ICD-10-CM | POA: Diagnosis present

## 2020-01-24 DIAGNOSIS — Z79899 Other long term (current) drug therapy: Secondary | ICD-10-CM | POA: Diagnosis not present

## 2020-01-24 DIAGNOSIS — I10 Essential (primary) hypertension: Secondary | ICD-10-CM | POA: Diagnosis present

## 2020-01-24 DIAGNOSIS — Z7902 Long term (current) use of antithrombotics/antiplatelets: Secondary | ICD-10-CM | POA: Diagnosis not present

## 2020-01-24 DIAGNOSIS — Z7982 Long term (current) use of aspirin: Secondary | ICD-10-CM | POA: Diagnosis not present

## 2020-01-24 DIAGNOSIS — R040 Epistaxis: Secondary | ICD-10-CM | POA: Diagnosis present

## 2020-01-24 DIAGNOSIS — Z8249 Family history of ischemic heart disease and other diseases of the circulatory system: Secondary | ICD-10-CM | POA: Diagnosis not present

## 2020-01-24 DIAGNOSIS — Z7952 Long term (current) use of systemic steroids: Secondary | ICD-10-CM | POA: Diagnosis not present

## 2020-01-24 DIAGNOSIS — Z20822 Contact with and (suspected) exposure to covid-19: Secondary | ICD-10-CM | POA: Diagnosis present

## 2020-01-24 DIAGNOSIS — E669 Obesity, unspecified: Secondary | ICD-10-CM | POA: Diagnosis present

## 2020-01-24 DIAGNOSIS — E860 Dehydration: Secondary | ICD-10-CM | POA: Diagnosis present

## 2020-01-24 DIAGNOSIS — R1011 Right upper quadrant pain: Secondary | ICD-10-CM

## 2020-01-24 DIAGNOSIS — J208 Acute bronchitis due to other specified organisms: Secondary | ICD-10-CM | POA: Diagnosis present

## 2020-01-24 DIAGNOSIS — J84112 Idiopathic pulmonary fibrosis: Secondary | ICD-10-CM

## 2020-01-24 DIAGNOSIS — J449 Chronic obstructive pulmonary disease, unspecified: Secondary | ICD-10-CM | POA: Diagnosis present

## 2020-01-24 LAB — CBC WITH DIFFERENTIAL/PLATELET
Abs Immature Granulocytes: 0.08 10*3/uL — ABNORMAL HIGH (ref 0.00–0.07)
Basophils Absolute: 0.1 10*3/uL (ref 0.0–0.1)
Basophils Relative: 1 %
Eosinophils Absolute: 0.1 10*3/uL (ref 0.0–0.5)
Eosinophils Relative: 1 %
HCT: 36.6 % — ABNORMAL LOW (ref 39.0–52.0)
Hemoglobin: 12.4 g/dL — ABNORMAL LOW (ref 13.0–17.0)
Immature Granulocytes: 1 %
Lymphocytes Relative: 8 %
Lymphs Abs: 0.8 10*3/uL (ref 0.7–4.0)
MCH: 32.4 pg (ref 26.0–34.0)
MCHC: 33.9 g/dL (ref 30.0–36.0)
MCV: 95.6 fL (ref 80.0–100.0)
Monocytes Absolute: 1.1 10*3/uL — ABNORMAL HIGH (ref 0.1–1.0)
Monocytes Relative: 11 %
Neutro Abs: 7.9 10*3/uL — ABNORMAL HIGH (ref 1.7–7.7)
Neutrophils Relative %: 78 %
Platelets: 363 10*3/uL (ref 150–400)
RBC: 3.83 MIL/uL — ABNORMAL LOW (ref 4.22–5.81)
RDW: 13.8 % (ref 11.5–15.5)
WBC: 10 10*3/uL (ref 4.0–10.5)
nRBC: 0 % (ref 0.0–0.2)

## 2020-01-24 LAB — BLOOD GAS, VENOUS
Acid-Base Excess: 5.5 mmol/L — ABNORMAL HIGH (ref 0.0–2.0)
Bicarbonate: 31.1 mmol/L — ABNORMAL HIGH (ref 20.0–28.0)
O2 Saturation: 63.1 %
Patient temperature: 37
pCO2, Ven: 48 mmHg (ref 44.0–60.0)
pH, Ven: 7.42 (ref 7.250–7.430)
pO2, Ven: 32 mmHg (ref 32.0–45.0)

## 2020-01-24 LAB — HEPATIC FUNCTION PANEL
ALT: 35 U/L (ref 0–44)
AST: 40 U/L (ref 15–41)
Albumin: 4.1 g/dL (ref 3.5–5.0)
Alkaline Phosphatase: 67 U/L (ref 38–126)
Bilirubin, Direct: 0.1 mg/dL (ref 0.0–0.2)
Indirect Bilirubin: 0.8 mg/dL (ref 0.3–0.9)
Total Bilirubin: 0.9 mg/dL (ref 0.3–1.2)
Total Protein: 7.4 g/dL (ref 6.5–8.1)

## 2020-01-24 LAB — EXPECTORATED SPUTUM ASSESSMENT W GRAM STAIN, RFLX TO RESP C

## 2020-01-24 LAB — BASIC METABOLIC PANEL
Anion gap: 12 (ref 5–15)
Anion gap: 14 (ref 5–15)
BUN: 10 mg/dL (ref 8–23)
BUN: 10 mg/dL (ref 8–23)
CO2: 28 mmol/L (ref 22–32)
CO2: 27 mmol/L (ref 22–32)
Calcium: 9.1 mg/dL (ref 8.9–10.3)
Calcium: 9.5 mg/dL (ref 8.9–10.3)
Chloride: 83 mmol/L — ABNORMAL LOW (ref 98–111)
Chloride: 90 mmol/L — ABNORMAL LOW (ref 98–111)
Creatinine, Ser: 0.71 mg/dL (ref 0.61–1.24)
Creatinine, Ser: 0.71 mg/dL (ref 0.61–1.24)
GFR calc Af Amer: >60 mL/min (ref >60)
GFR calc Af Amer: >60 mL/min (ref >60)
GFR calc non Af Amer: >60 mL/min (ref >60)
GFR calc non Af Amer: >60 mL/min (ref >60)
Glucose, Bld: 137 mg/dL — ABNORMAL HIGH (ref 70–99)
Glucose, Bld: 185 mg/dL — ABNORMAL HIGH (ref 70–99)
Potassium: 4 mmol/L (ref 3.5–5.1)
Potassium: 4.1 mmol/L (ref 3.5–5.1)
Sodium: 123 mmol/L — ABNORMAL LOW (ref 135–145)
Sodium: 131 mmol/L — ABNORMAL LOW (ref 135–145)

## 2020-01-24 LAB — RESPIRATORY PANEL BY RT PCR (FLU A&B, COVID)
Influenza A by PCR: NEGATIVE
Influenza B by PCR: NEGATIVE
SARS Coronavirus 2 by RT PCR: NEGATIVE

## 2020-01-24 LAB — OSMOLALITY, URINE: Osmolality, Ur: 286 mOsm/kg — ABNORMAL LOW (ref 300–900)

## 2020-01-24 LAB — TROPONIN I (HIGH SENSITIVITY)
Troponin I (High Sensitivity): 6 ng/L (ref <18)
Troponin I (High Sensitivity): 5 ng/L (ref <18)

## 2020-01-24 LAB — SODIUM, URINE, RANDOM: Sodium, Ur: 65 mmol/L

## 2020-01-24 LAB — OSMOLALITY: Osmolality: 274 mOsm/kg — ABNORMAL LOW (ref 275–295)

## 2020-01-24 LAB — LIPASE, BLOOD: Lipase: 26 U/L (ref 11–51)

## 2020-01-24 LAB — HIV ANTIBODY (ROUTINE TESTING W REFLEX): HIV Screen 4th Generation wRfx: NONREACTIVE

## 2020-01-24 LAB — BRAIN NATRIURETIC PEPTIDE: B Natriuretic Peptide: 42 pg/mL (ref 0.0–100.0)

## 2020-01-24 MED ORDER — MORPHINE SULFATE (PF) 2 MG/ML IV SOLN
1.0000 mg | INTRAVENOUS | Status: DC | PRN
  Administered 2020-01-28 – 2020-01-29 (×5): 1 mg via INTRAVENOUS
  Filled 2020-01-24 (×5): qty 1

## 2020-01-24 MED ORDER — METHYLPREDNISOLONE SODIUM SUCC 125 MG IJ SOLR
60.0000 mg | Freq: Two times a day (BID) | INTRAMUSCULAR | Status: DC
  Administered 2020-01-24 – 2020-01-29 (×10): 60 mg via INTRAVENOUS
  Filled 2020-01-24 (×10): qty 2

## 2020-01-24 MED ORDER — AMLODIPINE BESYLATE 10 MG PO TABS
10.0000 mg | ORAL_TABLET | Freq: Every day | ORAL | Status: DC
  Administered 2020-01-25 – 2020-01-29 (×5): 10 mg via ORAL
  Filled 2020-01-24 (×5): qty 1

## 2020-01-24 MED ORDER — THIAMINE HCL 100 MG/ML IJ SOLN
100.0000 mg | Freq: Every day | INTRAMUSCULAR | Status: DC
  Administered 2020-01-26 – 2020-01-27 (×2): 100 mg via INTRAVENOUS
  Filled 2020-01-24 (×2): qty 2

## 2020-01-24 MED ORDER — LORAZEPAM 2 MG/ML IJ SOLN: 1.0000 mg | INTRAMUSCULAR | Status: DC | PRN

## 2020-01-24 MED ORDER — EPINEPHRINE 0.3 MG/0.3ML IJ SOAJ
0.3000 mg | INTRAMUSCULAR | Status: DC | PRN
  Filled 2020-01-24: qty 0.6

## 2020-01-24 MED ORDER — HYDRALAZINE HCL 20 MG/ML IJ SOLN
5.0000 mg | INTRAMUSCULAR | Status: DC | PRN
  Administered 2020-01-26: 5 mg via INTRAVENOUS
  Filled 2020-01-24: qty 1

## 2020-01-24 MED ORDER — OXYMETAZOLINE HCL 0.05 % NA SOLN
1.0000 | Freq: Once | NASAL | Status: AC
  Administered 2020-01-24: 1 via NASAL
  Filled 2020-01-24: qty 30

## 2020-01-24 MED ORDER — CALCIUM CARBONATE-VITAMIN D 500-200 MG-UNIT PO TABS
2.0000 | ORAL_TABLET | Freq: Every day | ORAL | Status: DC
  Administered 2020-01-24 – 2020-01-28 (×5): 2 via ORAL
  Filled 2020-01-24 (×5): qty 2

## 2020-01-24 MED ORDER — SODIUM CHLORIDE 0.9 % IV SOLN: Freq: Once | INTRAVENOUS | Status: AC

## 2020-01-24 MED ORDER — PANTOPRAZOLE SODIUM 40 MG PO TBEC
40.0000 mg | DELAYED_RELEASE_TABLET | Freq: Two times a day (BID) | ORAL | Status: DC
  Administered 2020-01-24 – 2020-01-29 (×10): 40 mg via ORAL
  Filled 2020-01-24 (×10): qty 1

## 2020-01-24 MED ORDER — METOPROLOL SUCCINATE ER 50 MG PO TB24
50.0000 mg | ORAL_TABLET | Freq: Every day | ORAL | Status: DC
  Administered 2020-01-25 – 2020-01-29 (×5): 50 mg via ORAL
  Filled 2020-01-24 (×5): qty 1

## 2020-01-24 MED ORDER — ATORVASTATIN CALCIUM 20 MG PO TABS
40.0000 mg | ORAL_TABLET | Freq: Every day | ORAL | Status: DC
  Administered 2020-01-24 – 2020-01-28 (×5): 40 mg via ORAL
  Filled 2020-01-24 (×5): qty 2

## 2020-01-24 MED ORDER — LORAZEPAM 2 MG/ML IJ SOLN: 0.0000 mg | Freq: Two times a day (BID) | INTRAMUSCULAR | Status: DC

## 2020-01-24 MED ORDER — LORATADINE 10 MG PO TABS
10.0000 mg | ORAL_TABLET | Freq: Every day | ORAL | Status: DC
  Administered 2020-01-24 – 2020-01-29 (×6): 10 mg via ORAL
  Filled 2020-01-24 (×6): qty 1

## 2020-01-24 MED ORDER — IPRATROPIUM-ALBUTEROL 0.5-2.5 (3) MG/3ML IN SOLN
3.0000 mL | RESPIRATORY_TRACT | Status: DC
  Administered 2020-01-24 – 2020-01-29 (×29): 3 mL via RESPIRATORY_TRACT
  Filled 2020-01-24 (×33): qty 3

## 2020-01-24 MED ORDER — THIAMINE HCL 100 MG PO TABS
100.0000 mg | ORAL_TABLET | Freq: Every day | ORAL | Status: DC
  Administered 2020-01-24 – 2020-01-29 (×4): 100 mg via ORAL
  Filled 2020-01-24 (×5): qty 1

## 2020-01-24 MED ORDER — LORAZEPAM 2 MG/ML IJ SOLN: 0.0000 mg | Freq: Four times a day (QID) | INTRAMUSCULAR | Status: DC

## 2020-01-24 MED ORDER — ADULT MULTIVITAMIN W/MINERALS CH
1.0000 | ORAL_TABLET | Freq: Every day | ORAL | Status: DC
  Administered 2020-01-24 – 2020-01-29 (×6): 1 via ORAL
  Filled 2020-01-24 (×6): qty 1

## 2020-01-24 MED ORDER — METHYLPREDNISOLONE SODIUM SUCC 125 MG IJ SOLR
125.0000 mg | Freq: Once | INTRAMUSCULAR | Status: AC
  Administered 2020-01-24: 125 mg via INTRAVENOUS
  Filled 2020-01-24: qty 2

## 2020-01-24 MED ORDER — LORAZEPAM 1 MG PO TABS: 1.0000 mg | ORAL_TABLET | ORAL | Status: DC | PRN

## 2020-01-24 MED ORDER — IPRATROPIUM-ALBUTEROL 0.5-2.5 (3) MG/3ML IN SOLN
3.0000 mL | Freq: Once | RESPIRATORY_TRACT | Status: AC
  Administered 2020-01-24: 3 mL via RESPIRATORY_TRACT
  Filled 2020-01-24: qty 3

## 2020-01-24 MED ORDER — DM-GUAIFENESIN ER 30-600 MG PO TB12
1.0000 | ORAL_TABLET | Freq: Two times a day (BID) | ORAL | Status: DC
  Administered 2020-01-24 – 2020-01-29 (×11): 1 via ORAL
  Filled 2020-01-24 (×11): qty 1

## 2020-01-24 MED ORDER — ALBUTEROL SULFATE (2.5 MG/3ML) 0.083% IN NEBU: 2.5000 mg | INHALATION_SOLUTION | RESPIRATORY_TRACT | Status: DC | PRN

## 2020-01-24 MED ORDER — SODIUM CHLORIDE 0.9 % IV SOLN: INTRAVENOUS | Status: DC

## 2020-01-24 MED ORDER — ACETAMINOPHEN 325 MG PO TABS
650.0000 mg | ORAL_TABLET | Freq: Four times a day (QID) | ORAL | Status: DC | PRN
  Administered 2020-01-24 – 2020-01-28 (×6): 650 mg via ORAL
  Filled 2020-01-24 (×7): qty 2

## 2020-01-24 MED ORDER — MAGNESIUM OXIDE 400 (241.3 MG) MG PO TABS
400.0000 mg | ORAL_TABLET | Freq: Every day | ORAL | Status: DC
  Administered 2020-01-24 – 2020-01-28 (×5): 400 mg via ORAL
  Filled 2020-01-24 (×5): qty 1

## 2020-01-24 MED ORDER — ONDANSETRON HCL 4 MG/2ML IJ SOLN: 4.0000 mg | Freq: Three times a day (TID) | INTRAMUSCULAR | Status: DC | PRN

## 2020-01-24 MED ORDER — FOLIC ACID 1 MG PO TABS
1.0000 mg | ORAL_TABLET | Freq: Every day | ORAL | Status: DC
  Administered 2020-01-24 – 2020-01-29 (×6): 1 mg via ORAL
  Filled 2020-01-24 (×6): qty 1

## 2020-01-24 NOTE — ED Notes (Signed)
Pt up to toilet 

## 2020-01-24 NOTE — ED Notes (Signed)
Pt taken to ultrasound

## 2020-01-24 NOTE — ED Triage Notes (Signed)
Pt comes via [facility] from home with c/o SOB, nose bleed and some upper right abdominal pain. Pt states this all started this am.  EMS reports VSS, [id number]  Pt wears 2L [place] at home, EMS reports they bumped up to 4L, pt arrives and MD placed on 2L [place], sats 97%.  Bleeding absent at this time

## 2020-01-24 NOTE — H&P (Signed)
History and Physical    Austin Patterson [id number] DOB: [birth year] DOA: 01/24/2020  Referring MD/NP/PA:   PCP: Laneta Simmers, NP   Patient coming from:  The patient is coming from home.  At baseline, pt is independent for most of ADL.        Chief Complaint: SOB  HPI: Austin Patterson is a 64 y.o. male with medical history significant of COPD on 2 L oxygen at home, pulmonary fibrosis, hypertension, hyperlipidemia, GERD, CAD, stent placement, alcohol abuse, who presents with shortness breath.  Patient states that she has been having shortness of breath in the past several days, which has been progressively worsening.  He has to titrate up oxygen level from 2 L at baseline to 4 nasal cannula oxygen at home.  He has dry cough, but no fever or chills.  No chest pain. He reports that he has been having intermittent right upper quadrant abdominal pain for about 1 month, which is 4 out of 10 in severity, aching, nonradiating.  Denies nausea, vomiting or diarrhea.  No symptoms of UTI or unilateral weakness.  Patient initially had right nostril nosebleeding which has stopped after treated with Afrin spray in the ED  ED Course: pt was found to have WBC 10.0, lipase 26, troponin 6, 5, BNP 42, negative COVID-19 PCR, sodium 123, renal function normal, temperature normal, blood pressure 175/91, heart rate 98, RR 18, oxygen saturation 97% on 2 L nasal cannula oxygen currently.  Chest x-ray showed chronic fibrotic change and interstitial lung disease. Pt is admitted to [place] bed as inpatient.  # US-RUQ: Normal gallbladder, with hepatic steatosis  Review of Systems:   General: no fevers, chills, no body weight gain, has poor appetite, has fatigue HEENT: no blurry vision, hearing changes or sore throat Respiratory: has dyspnea, coughing, wheezing CV: no chest pain, no palpitations GI: no nausea, vomiting, has RUQ abdominal pain, no diarrhea, constipation GU: no dysuria, burning on urination,  increased urinary frequency, hematuria  Ext: no leg edema Neuro: no unilateral weakness, numbness, or tingling, no vision change or hearing loss Skin: no rash, no skin tear. MSK: No muscle spasm, no deformity, no limitation of range of movement in spin Heme: No easy bruising.  Travel history: No recent long distant travel.  Allergy:  Allergies  Allergen Reactions  . Amoxicillin Anaphylaxis    Past Medical History:  Diagnosis Date  . COPD (chronic obstructive pulmonary disease) ([place])   . Hyperlipidemia   . Hypertension   . Pulmonary fibrosis ([place]) 11/2015    Past Surgical History:  Procedure Laterality Date  . CORONARY STENT PLACEMENT    . ESOPHAGOGASTRODUODENOSCOPY (EGD) WITH PROPOFOL N/A 09/23/2016   Procedure: ESOPHAGOGASTRODUODENOSCOPY (EGD) WITH PROPOFOL;  Surgeon: [name], MD;  Location: [facility] ENDOSCOPY;  Service: Endoscopy;  Laterality: N/A;  . SHOULDER ACROMIOPLASTY      Social History:  reports that he quit smoking about 4 years ago. His smoking use included cigarettes. He has a 100.00 pack-year smoking history. He has quit using smokeless tobacco. He reports current alcohol use of about 28.0 standard drinks of alcohol per week. He reports that he does not use drugs.  Family History:  Family History  Problem Relation Age of Onset  . Heart disease Mother      Prior to Admission medications   Medication Sig Start Date End Date Taking? Authorizing Provider  albuterol (PROVENTIL HFA;VENTOLIN HFA) 108 (90 Base) MCG/ACT inhaler Inhale 2 puffs into the lungs every 6 (six) hours as needed for  wheezing or shortness of breath. 01/29/17   Darel Hong, MD  amLODipine (NORVASC) 10 MG tablet Take 1 tablet (10 mg total) by mouth daily. 08/19/16   Tawni Millers, MD  aspirin EC 81 MG tablet Take 1 tablet (81 mg total) by mouth daily. 08/19/16   Tawni Millers, MD  atorvastatin (LIPITOR) 40 MG tablet Take 1 tablet (40 mg total) by mouth at bedtime. 08/19/16   Tawni Millers, MD    calcium-vitamin D (OSCAL WITH D) 500-200 MG-UNIT tablet Take 2 tablets by mouth daily with breakfast. Patient taking differently: Take 2 tablets by mouth daily in the afternoon.  09/24/16   Fritzi Mandes, MD  cetirizine (ZYRTEC) 10 MG tablet Take 10 mg by mouth at bedtime. 08/19/16   [provider]  clopidogrel (PLAVIX) 75 MG tablet Take 1 tablet (75 mg total) by mouth daily. 08/19/16   Tawni Millers, MD  Dupilumab (DUPIXENT) 300 MG/2ML SOPN Inject 2 mLs into the skin every 14 (fourteen) days. 12/05/19   Tyler Pita, MD  Fluticasone-Salmeterol (ADVAIR DISKUS) 250-50 MCG/DOSE AEPB Inhale 1 puff into the lungs 2 (two) times daily. 10/31/19 10/30/20  Tyler Pita, MD  ipratropium-albuterol (DUONEB) 0.5-2.5 (3) MG/3ML SOLN Take 3 mLs by nebulization 4 (four) times daily as needed (wheezing/shortness of breath).  08/08/19   [provider]  magnesium oxide (MAG-OX) 400 (241.3 Mg) MG tablet Take 1 tablet (400 mg total) by mouth daily. Patient taking differently: Take 400 mg by mouth daily in the afternoon.  06/18/17   Hillary Bow, MD  metoprolol succinate (TOPROL-XL) 50 MG 24 hr tablet Take 1 tablet (50 mg total) by mouth daily. 08/19/16   Tawni Millers, MD  Multiple Vitamin (MULTIVITAMIN WITH MINERALS) TABS tablet Take 1 tablet by mouth daily. Patient taking differently: Take 1 tablet by mouth daily in the afternoon.  09/24/16   Fritzi Mandes, MD  pantoprazole (PROTONIX) 40 MG tablet Take 1 tablet (40 mg total) by mouth 2 (two) times daily. 09/23/16   Fritzi Mandes, MD  potassium chloride 20 MEQ TBCR Take 20 mEq by mouth daily. Patient taking differently: Take 20 mEq by mouth daily in the afternoon.  06/18/17   Hillary Bow, MD  predniSONE (DELTASONE) 10 MG tablet Take 1 tablet (10 mg total) by mouth daily with breakfast. 11/08/19   Tyler Pita, MD  Spacer/Aero-Holding Chambers (AEROCHAMBER MV) inhaler Use as instructed 10/05/19   Tyler Pita, MD    Physical  Exam: Vitals:   01/24/20 1130 01/24/20 1200 01/24/20 1238 01/24/20 1245  BP: (!) 152/89 (!) 153/80 (!) 153/80   Pulse: 91 89 88 91  Resp: (!) 21 19  17   Temp:      TempSrc:      SpO2: 93% 95%  99%  Weight:      Height:       General: Not in acute distress HEENT:       Eyes: PERRL, EOMI, no scleral icterus.       ENT: No discharge from the ears and nose, no pharynx injection, no tonsillar enlargement.        Neck: No JVD, no bruit, no mass felt. Heme: No neck lymph node enlargement. Cardiac: S1/S2, RRR, No murmurs, No gallops or rubs. Respiratory: Has diffused wheezing bilaterally GI: Soft, nondistended, has tenderness in RUQ, no rebound pain, no organomegaly, BS present. GU: No hematuria Ext: No pitting leg edema bilaterally. 2+DP/PT pulse bilaterally. Musculoskeletal: No joint deformities, No joint redness or  warmth, no limitation of ROM in spin. Skin: No rashes.  Neuro: Alert, oriented X3, cranial nerves II-XII grossly intact, moves all extremities normally.  Psych: Patient is not psychotic, no suicidal or hemocidal ideation.  Labs on Admission: I have personally reviewed following labs and imaging studies  CBC: Recent Labs  Lab 01/24/20 0909  WBC 10.0  NEUTROABS 7.9*  HGB 12.4*  HCT 36.6*  MCV 95.6  PLT [id number]   Basic Metabolic Panel: Recent Labs  Lab 01/24/20 0909  NA 123*  K 4.0  CL 83*  CO2 28  GLUCOSE 137*  BUN 10  CREATININE 0.71  CALCIUM 9.1   GFR: Estimated Creatinine Clearance: 103.8 mL/min (by C-G formula based on SCr of 0.71 mg/dL). Liver Function Tests: Recent Labs  Lab 01/24/20 0909  AST 40  ALT 35  ALKPHOS 67  BILITOT 0.9  PROT 7.4  ALBUMIN 4.1   Recent Labs  Lab 01/24/20 0909  LIPASE 26   No results for input(s): AMMONIA in the last 168 hours. Coagulation Profile: No results for input(s): INR, PROTIME in the last 168 hours. Cardiac Enzymes: No results for input(s): CKTOTAL, CKMB, CKMBINDEX, TROPONINI in the last 168 hours. BNP  (last 3 results) No results for input(s): PROBNP in the last 8760 hours. HbA1C: No results for input(s): HGBA1C in the last 72 hours. CBG: No results for input(s): GLUCAP in the last 168 hours. Lipid Profile: No results for input(s): CHOL, HDL, LDLCALC, TRIG, CHOLHDL, LDLDIRECT in the last 72 hours. Thyroid Function Tests: No results for input(s): TSH, T4TOTAL, FREET4, T3FREE, THYROIDAB in the last 72 hours. Anemia Panel: No results for input(s): VITAMINB12, FOLATE, FERRITIN, TIBC, IRON, RETICCTPCT in the last 72 hours. Urine analysis:    Component Value Date/Time   COLORURINE YELLOW (A) 12/24/2017 1750   APPEARANCEUR HAZY (A) 12/24/2017 1750   LABSPEC 1.017 12/24/2017 1750   PHURINE 5.0 12/24/2017 1750   GLUCOSEU NEGATIVE 12/24/2017 1750   HGBUR NEGATIVE 12/24/2017 1750   BILIRUBINUR NEGATIVE 12/24/2017 1750   KETONESUR NEGATIVE 12/24/2017 1750   PROTEINUR 30 (A) 12/24/2017 1750   NITRITE NEGATIVE 12/24/2017 1750   LEUKOCYTESUR NEGATIVE 12/24/2017 1750   Sepsis Labs: @LABRCNTIP (procalcitonin:4,lacticidven:4) ) Recent Results (from the past 240 hour(s))  Respiratory Panel by RT PCR (Flu A&B, Covid) - Nasopharyngeal Swab     Status: None   Collection Time: 01/24/20  9:10 AM   Specimen: Nasopharyngeal Swab  Result Value Ref Range Status   SARS Coronavirus 2 by RT PCR NEGATIVE NEGATIVE Final    Comment: (NOTE) SARS-CoV-2 target nucleic acids are NOT DETECTED. The SARS-CoV-2 RNA is generally detectable in upper respiratoy specimens during the acute phase of infection. The lowest concentration of SARS-CoV-2 viral copies this assay can detect is 131 copies/mL. A negative result does not preclude SARS-Cov-2 infection and should not be used as the sole basis for treatment or other patient management decisions. A negative result may occur with  improper specimen collection/handling, submission of specimen other than nasopharyngeal swab, presence of viral mutation(s) within  the areas targeted by this assay, and inadequate number of viral copies (<131 copies/mL). A negative result must be combined with clinical observations, patient history, and epidemiological information. The expected result is Negative. Fact Sheet for Patients:  PinkCheek.be Fact Sheet for Healthcare Providers:  GravelBags.it This test is not yet ap proved or cleared by the [place] FDA and  has been authorized for detection and/or diagnosis of SARS-CoV-2 by FDA under an Emergency Use Authorization (EUA). This EUA  will remain  in effect (meaning this test can be used) for the duration of the COVID-19 declaration under Section 564(b)(1) of the Act, 21 U.S.C. section 360bbb-3(b)(1), unless the authorization is terminated or revoked sooner.    Influenza A by PCR NEGATIVE NEGATIVE Final   Influenza B by PCR NEGATIVE NEGATIVE Final    Comment: (NOTE) The Xpert Xpress SARS-CoV-2/FLU/RSV assay is intended as an aid in  the diagnosis of influenza from Nasopharyngeal swab specimens and  should not be used as a sole basis for treatment. Nasal washings and  aspirates are unacceptable for Xpert Xpress SARS-CoV-2/FLU/RSV  testing. Fact Sheet for Patients: PinkCheek.be Fact Sheet for Healthcare Providers: GravelBags.it This test is not yet approved or cleared by the [place] FDA and  has been authorized for detection and/or diagnosis of SARS-CoV-2 by  FDA under an Emergency Use Authorization (EUA). This EUA will remain  in effect (meaning this test can be used) for the duration of the  Covid-19 declaration under Section 564(b)(1) of the Act, 21  U.S.C. section 360bbb-3(b)(1), unless the authorization is  terminated or revoked. Performed at [facility], [address] [place], [place] [address]      Radiological Exams on Admission: DG Chest [facility] 1  View  Result Date: 01/24/2020 CLINICAL DATA:  Dyspnea EXAM: PORTABLE CHEST 1 VIEW COMPARISON:  11/01/2019 FINDINGS: Chronic lung disease with emphysema and fibrotic appearance. Increased markings at the bases which could be bronchitis or just poor inspiration. No edema, effusion, or pneumothorax. Normal heart size and stable mediastinal contours. IMPRESSION: Chronic lung disease with fibrosis by remote chest CT. Interstitial coarsening is more prominent than on prior and this could be from lower lung volumes or acute interstitial disease. Electronically Signed   By: Monte Fantasia M.D.   On: 01/24/2020 09:32   US ABDOMEN LIMITED RUQ  Result Date: 01/24/2020 CLINICAL DATA:  Right upper quadrant abdominal pain EXAM: ULTRASOUND ABDOMEN LIMITED RIGHT UPPER QUADRANT COMPARISON:  None. FINDINGS: Gallbladder: No gallstones or wall thickening visualized. No sonographic Murphy sign noted by sonographer. Common bile duct: Diameter: Liver: Increased echotexture seen throughout. No focal abnormality or biliary ductal dilatation. Portal vein is patent on color Doppler imaging with normal direction of blood flow towards the liver. Other: None. IMPRESSION: Normal gallbladder. Hepatic steatosis Electronically Signed   By: Prudencio Pair M.D.   On: 01/24/2020 13:30     EKG: Independently reviewed.  Sinus rhythm, QTC 420, LAD   Assessment/Plan Principal Problem:   COPD exacerbation (HCC) Active Problems:   Hyponatremia   Reflux esophagitis   IPF (idiopathic pulmonary fibrosis) (HCC)   RUQ pain   Essential hypertension   Acute on chronic respiratory failure with hypoxia (HCC)   CAD (coronary artery disease)   Hyperlipidemia   Bleeding nose   Alcohol abuse   Acute on chronic respiratory failure with hypoxia due to COPD exacerbation: -will admit patient to med-surg bed as impt -Bronchodilators -Solu-Medrol 60 mg IV tid -Mucinex for cough  -Incentive spirometry - sputum culture -Nasal cannula oxygen as  needed to maintain O2 saturation 93% or greater  IPF (idiopathic pulmonary fibrosis): -Bronchodilators and steroids as above  Hyponatremia: Na 123, likely due to alcohol abuse - Will check urine sodium, urine osmolality, serum osmolality. - check TSH - IVF: 100 mL/h of NS - f/u by BMP q8h -Hold Hyzaar - avoid over correction too fast due to risk of central pontine myelinolysis  Reflux esophagitis: -protonix  RUQ pain: LFT OK. Lipase normal. US-RQU not  impressive. -Supportive care, as needed morphine for pain, Zofran for nausea  HTN:  -Continue home medications: Metoprolol -Hold Hyzaar due to hyponatremia -hydralazine prn  CAD (coronary artery disease):  -hold ASA and plavix due to nosebleeding -Lipitor and metoprolol  Hyperlipidemia -Lipitor  Bleeding nose: -stopped with Afrin spry  Alcohol abuse: -CiWA     Inpatient status:  # Patient requires inpatient status due to high intensity of service, high risk for further deterioration and high frequency of surveillance required.  I certify that at the point of admission it is my clinical judgment that the patient will require inpatient hospital care spanning beyond 2 midnights from the point of admission.  . This patient has multiple chronic comorbidities including COPD on 2 L oxygen at home, pulmonary fibrosis, hypertension, hyperlipidemia, GERD, CAD, stent placement, alcohol abuse . Now patient has presenting with acute on chronic respiratory failure with hypoxia due to COPD exacerbation and pulmonary fibrosis. Also has hyponatremia sodium 123 and upper quadrant abdominal pain . The worrisome physical exam findings include diffused wheezing on auscultation.  Has upper quadrant abdominal tenderness . The initial radiographic and laboratory data are worrisome because of hyponatremia and pulmonary fibrotic change on chest x-ray . Current medical needs: please see my assessment and plan . Predictability of an adverse outcome  (risk): Patient has multiple comorbidities as listed above. Now presents with  acute on chronic respiratory failure with hypoxia due to COPD exacerbation and pulmonary fibrosis. Also has hyponatremia sodium 123 and upper quadrant abdominal pain. Patient's presentation is highly complicated.  Patient is at high risk of deteriorating.  Will need to be treated in hospital for at least 2 days.           DVT ppx: SCD Code Status: Full code Family Communication: not done, no family member is at bed side. Disposition Plan:  Anticipate discharge back to previous home environment Consults called:  none Admission status: Med-surg bed inpt      Date of Service 01/24/2020    [place] [facility]   If 7PM-7AM, please contact night-coverage [email or web address] 01/24/2020, 1:45 PM

## 2020-01-24 NOTE — ED Notes (Signed)
Pt back from ultrasound.

## 2020-01-24 NOTE — ED Provider Notes (Signed)
[facility] Emergency Department Provider Note       Time seen: ----------------------------------------- 9:03 AM on 01/24/2020 -----------------------------------------   I have reviewed the triage vital signs and the nursing notes.  HISTORY   Chief Complaint No chief complaint on file.    HPI Austin Patterson is a 64 y.o. male with a history of COPD, hyperlipidemia, hypertension, pulmonary fibrosis who presents to the ED for difficulty breathing, epistaxis and right upper quadrant abdominal pain.  Patient was wheezing, was placed on 4 L nasal cannula, is typically only on 2.  He was bleeding from the right nostril.  Past Medical History:  Diagnosis Date  . COPD (chronic obstructive pulmonary disease) ([place])   . Hyperlipidemia   . Hypertension   . Pulmonary fibrosis ([place]) 11/2015    Patient Active Problem List   Diagnosis Date Noted  . Anaphylaxis 11/01/2019  . Hypotension 11/01/2019  . CAD (coronary artery disease) 11/01/2019  . H/O heart artery stent 11/01/2019  . Acute on chronic respiratory failure with hypoxia ([place])   . RUQ pain   . Essential hypertension   . IPF (idiopathic pulmonary fibrosis) ([place]) 08/30/2019  . Hypomagnesemia 03/31/2017  . Hiatal hernia   . Reflux esophagitis   . Hematemesis without nausea   . Hyponatremia 09/21/2016  . Multifocal pneumonia 12/09/2015  . COPD with acute exacerbation ([place]) 10/14/2015    Past Surgical History:  Procedure Laterality Date  . CORONARY STENT PLACEMENT    . ESOPHAGOGASTRODUODENOSCOPY (EGD) WITH PROPOFOL N/A 09/23/2016   Procedure: ESOPHAGOGASTRODUODENOSCOPY (EGD) WITH PROPOFOL;  Surgeon: [name], MD;  Location: [facility] ENDOSCOPY;  Service: Endoscopy;  Laterality: N/A;  . SHOULDER ACROMIOPLASTY      Allergies Amoxicillin  Social History Social History   Tobacco Use  . Smoking status: Former Smoker    Packs/day: 2.00    Years: 50.00    Pack years: 100.00    Types: Cigarettes     Quit date: 10/14/2015    Years since quitting: 4.2  . Smokeless tobacco: Former Network engineer Use Topics  . Alcohol use: Yes    Alcohol/week: 28.0 standard drinks    Types: 28 Cans of beer per week    Comment: 6 12 oz cans of beer/day  . Drug use: No    Review of Systems Constitutional: Negative for fever. HEENT: Positive for nosebleed Cardiovascular: Negative for chest pain. Respiratory: Positive for shortness of breath Gastrointestinal: Positive for abdominal pain Musculoskeletal: Negative for back pain. Skin: Negative for rash. Neurological: Negative for headaches, focal weakness or numbness.  All systems negative/normal/unremarkable except as stated in the HPI  ____________________________________________   PHYSICAL EXAM:  VITAL SIGNS: ED Triage Vitals  Enc Vitals Group     BP      Pulse      Resp      Temp      Temp src      SpO2      Weight      Height      Head Circumference      Peak Flow      Pain Score      Pain Loc      Pain Edu?      Excl. in [place]?     Constitutional: Alert and oriented.  Mild distress Eyes: Conjunctivae are normal. Normal extraocular movements. ENT      Head: Normocephalic and atraumatic.      Nose: No congestion/rhinnorhea.  Recent bleeding on the right side  Mouth/Throat: Mucous membranes are moist.      Neck: No stridor. Cardiovascular: Normal rate, regular rhythm. No murmurs, rubs, or gallops. Respiratory: Mild tachypnea with wheezing bilaterally Gastrointestinal: Mild right upper quadrant tenderness, no rebound or guarding.  Normal bowel sounds. Musculoskeletal: Nontender with normal range of motion in extremities. No lower extremity tenderness nor edema. Neurologic:  Normal speech and language. No gross focal neurologic deficits are appreciated.  Skin:  Skin is warm, dry and intact. No rash noted. Psychiatric: Mood and affect are normal. Speech and behavior are normal.   ____________________________________________  EKG: Interpreted by me.  Sinus rhythm with rate of 95 bpm, normal PR interval, normal QRS, normal QT  ____________________________________________  ED COURSE:  As part of my medical decision making, I reviewed the following data within the [place] History obtained from family if available, nursing notes, old chart and ekg, as well as notes from prior ED visits. Patient presented for dyspnea, we will assess with labs and imaging as indicated at this time.   Procedures  [name] was evaluated in Emergency Department on 01/24/2020 for the symptoms described in the history of present illness. He was evaluated in the context of the global COVID-19 pandemic, which necessitated consideration that the patient might be at risk for infection with the SARS-CoV-2 virus that causes COVID-19. Institutional protocols and algorithms that pertain to the evaluation of patients at risk for COVID-19 are in a state of rapid change based on information released by regulatory bodies including the CDC and federal and state organizations. These policies and algorithms were followed during the patient's care in the ED.  ____________________________________________   LABS (pertinent positives/negatives)  Labs Reviewed  CBC WITH DIFFERENTIAL/PLATELET - Abnormal; Notable for the following components:      Result Value   RBC 3.83 (*)    Hemoglobin 12.4 (*)    HCT 36.6 (*)    Neutro Abs 7.9 (*)    Monocytes Absolute 1.1 (*)    Abs Immature Granulocytes 0.08 (*)    All other components within normal limits  BASIC METABOLIC PANEL - Abnormal; Notable for the following components:   Sodium 123 (*)    Chloride 83 (*)    Glucose, Bld 137 (*)    All other components within normal limits  BLOOD GAS, VENOUS - Abnormal; Notable for the following components:   Bicarbonate 31.1 (*)    Acid-Base Excess 5.5 (*)    All other components within normal  limits  RESPIRATORY PANEL BY RT PCR (FLU A&B, COVID)  BRAIN NATRIURETIC PEPTIDE  TROPONIN I (HIGH SENSITIVITY)  TROPONIN I (HIGH SENSITIVITY)    RADIOLOGY Images were viewed by me  Chest x-ray IMPRESSION: Chronic lung disease with fibrosis by remote chest CT. Interstitial coarsening is more prominent than on prior and this could be from lower lung volumes or acute interstitial disease.  ____________________________________________   DIFFERENTIAL DIAGNOSIS   CHF, COPD, pneumonia, pneumothorax, [id number], biliary colic, epistaxis  FINAL ASSESSMENT AND PLAN  COPD, epistaxis, hyponatremia   Plan: The patient had presented for difficulty breathing. Patient's labs did reveal surprising hyponatremia. Patient's imaging did reveal an increase in coarsened appearance to his lungs of uncertain significance.  He has no white count or fever.  He did receive duo nebs and steroids and is still wheezing, I have placed him on IV normal saline for his hyponatremia.  I will discuss with the hospitalist for admission.   Laurence Aly, MD    Note: This  note was generated in part or whole with voice recognition software. Voice recognition is usually quite accurate but there are transcription errors that can and very often do occur. I apologize for any typographical errors that were not detected and corrected.     Earleen Newport, MD 01/24/20 1057

## 2020-01-25 LAB — TSH: TSH: 0.263 u[IU]/mL — ABNORMAL LOW (ref 0.350–4.500)

## 2020-01-25 LAB — BASIC METABOLIC PANEL
Anion gap: 11 (ref 5–15)
Anion gap: 12 (ref 5–15)
BUN: 13 mg/dL (ref 8–23)
BUN: 18 mg/dL (ref 8–23)
CO2: 27 mmol/L (ref 22–32)
CO2: 28 mmol/L (ref 22–32)
Calcium: 8.9 mg/dL (ref 8.9–10.3)
Calcium: 9.1 mg/dL (ref 8.9–10.3)
Chloride: 89 mmol/L — ABNORMAL LOW (ref 98–111)
Chloride: 93 mmol/L — ABNORMAL LOW (ref 98–111)
Creatinine, Ser: 0.6 mg/dL — ABNORMAL LOW (ref 0.61–1.24)
Creatinine, Ser: 0.79 mg/dL (ref 0.61–1.24)
GFR calc Af Amer: >60 mL/min (ref >60)
GFR calc Af Amer: >60 mL/min (ref >60)
GFR calc non Af Amer: >60 mL/min (ref >60)
GFR calc non Af Amer: >60 mL/min (ref >60)
Glucose, Bld: 191 mg/dL — ABNORMAL HIGH (ref 70–99)
Glucose, Bld: 204 mg/dL — ABNORMAL HIGH (ref 70–99)
Potassium: 4.3 mmol/L (ref 3.5–5.1)
Potassium: 4.6 mmol/L (ref 3.5–5.1)
Sodium: 128 mmol/L — ABNORMAL LOW (ref 135–145)
Sodium: 132 mmol/L — ABNORMAL LOW (ref 135–145)

## 2020-01-25 NOTE — Progress Notes (Signed)
PROGRESS NOTE    Austin Patterson  [id number] DOB: 1956/07/16 DOA: 01/24/2020 PCP: Laneta Simmers, NP      Brief Narrative:  Mr. Buecker is a 64 y.o. M with COPD on 2 L oxygen at home, pulmonary fibrosis, hypertension, hyperlipidemia, GERD, CAD, stent placement, alcohol abuse, who presents with shortness breath.  Patient states that she has been having shortness of breath in the past several days, which has been progressively worsening.  He has to titrate up oxygen level from 2 L at baseline to 4 nasal cannula oxygen at home.  He has dry cough, but no fever or chills.  In the ER WBC 10.0, lipase 26, troponin 6, 5, BNP 42, negative COVID-19 PCR, sodium 123, renal function normal, temperature normal, blood pressure 175/91, heart rate 98, RR 18, oxygen saturation 97% on 2 L nasal cannula oxygen currently.  Chest x-ray showed chronic fibrotic change and interstitial lung disease. Pt is admitted to [facility] bed as inpatient.     Assessment & Plan:  Acute on chronic respiratory failure with hypoxia due to COPD exacerbation: Continue Solu-Medrol -Continue bronchodilators and oxygen as needed -Continue Mucinec   IPF (idiopathic pulmonary fibrosis):  Hyponatremia:  Sodium up to 130 today, likely from chronic respiratory disease, alcohol use. -Check free T4 -Hold fluids -Hold Hyzaar  Reflux esophagitis: -Continue Protonix  RUQ pain:  LFTs, lipase normal.  Right upper quadrant ultrasound normal  HTN:  -Continue metoprolol -Hold Hyzaar -hydralazine prn  CAD (coronary artery disease):  -Resume aspirin and Plavix at discharge --Continue Lipitor and metoprolol  Hyperlipidemia Continue Lipitor  Epistaxis: Resolved with Afrin spray  Alcohol abuse: -Continue CIWA       Disposition: The patient was admitted with COPD exacerbation.  Patient was unable to walk more than a few feet today with nursing due to  Dyspnea. I will discharge when his dyspnea is  starting to resolve.        MDM: The below labs and imaging reports were reviewed and summarized above.  Medication management as above. This is a severe exacerbation of his chronic disease.   DVT prophylaxis: CDs Code Status: Full code Family Communication:     Consultants:   Procedures:     Antimicrobials:      Culture data:              Subjective: Patient still very dyspneic, wheezing.  No sputum, fever, confusion, chest pain.  Objective: Vitals:   01/24/20 2041 01/25/20 0429 01/25/20 0805 01/25/20 0828  BP: 131/75 (!) 155/80  (!) 154/77  Pulse: 98 98  91  Resp: 20 16    Temp: (!) 97.5 F (36.4 C) 98 F (36.7 C)    TempSrc: Oral Oral    SpO2: 94% 92% 91% 93%  Weight:      Height:        Intake/Output Summary (Last 24 hours) at 01/25/2020 0941 Last data filed at 01/25/2020 0747 Gross per 24 hour  Intake 1995.45 ml  Output 3125 ml  Net -1129.55 ml   Filed Weights   01/24/20 0906  Weight: 97.5 kg    Examination: General appearance:  adult male, alert and in mild respiratory distress.  Lying in bed,  HEENT: Anicteric, conjunctiva pink, lids and lashes normal. No nasal deformity, discharge, epistaxis.  Lips moist.   Skin: Warm and dry.  No jaundice.  No suspicious rashes or lesions. Cardiac: RRR, nl [id number], soft systolic murmur capillary refill is brisk.  JVP not visible.  No LE edema.  Radial pulses 2+ and symmetric. Respiratory: Dyspneic with exertion, bilateral wheezing, no rales Abdomen: Abdomen soft.  No TTP guarding. No ascites, distension, hepatosplenomegaly.   MSK: No deformities or effusions. Neuro: Awake and alert.  EOMI, moves all extremities. Speech fluent.    Psych: Sensorium intact and responding to questions, attention normal. Affect normal.  Judgment and insight appear normal.    Data Reviewed: I have personally reviewed following labs and imaging studies:  CBC: Recent Labs  Lab 01/24/20 0909  WBC 10.0  NEUTROABS 7.9*   HGB 12.4*  HCT 36.6*  MCV 95.6  PLT [id number]   Basic Metabolic Panel: Recent Labs  Lab 01/24/20 0909 01/24/20 1537 01/25/20 0003 01/25/20 0720  NA 123* 131* 128* 132*  K 4.0 4.1 4.6 4.3  CL 83* 90* 89* 93*  CO2 28 27 27 28   GLUCOSE 137* 185* 204* 191*  BUN 10 10 18 13   CREATININE 0.71 0.71 0.79 0.60*  CALCIUM 9.1 9.5 8.9 9.1   GFR: Estimated Creatinine Clearance: 103.8 mL/min (A) (by C-G formula based on SCr of 0.6 mg/dL (L)). Liver Function Tests: Recent Labs  Lab 01/24/20 0909  AST 40  ALT 35  ALKPHOS 67  BILITOT 0.9  PROT 7.4  ALBUMIN 4.1   Recent Labs  Lab 01/24/20 0909  LIPASE 26   No results for input(s): AMMONIA in the last 168 hours. Coagulation Profile: No results for input(s): INR, PROTIME in the last 168 hours. Cardiac Enzymes: No results for input(s): CKTOTAL, CKMB, CKMBINDEX, TROPONINI in the last 168 hours. BNP (last 3 results) No results for input(s): PROBNP in the last 8760 hours. HbA1C: No results for input(s): HGBA1C in the last 72 hours. CBG: No results for input(s): GLUCAP in the last 168 hours. Lipid Profile: No results for input(s): CHOL, HDL, LDLCALC, TRIG, CHOLHDL, LDLDIRECT in the last 72 hours. Thyroid Function Tests: Recent Labs    01/25/20 0003  TSH 0.263*   Anemia Panel: No results for input(s): VITAMINB12, FOLATE, FERRITIN, TIBC, IRON, RETICCTPCT in the last 72 hours. Urine analysis:    Component Value Date/Time   COLORURINE YELLOW (A) 12/24/2017 1750   APPEARANCEUR HAZY (A) 12/24/2017 1750   LABSPEC 1.017 12/24/2017 1750   PHURINE 5.0 12/24/2017 1750   GLUCOSEU NEGATIVE 12/24/2017 1750   HGBUR NEGATIVE 12/24/2017 1750   BILIRUBINUR NEGATIVE 12/24/2017 1750   KETONESUR NEGATIVE 12/24/2017 1750   PROTEINUR 30 (A) 12/24/2017 1750   NITRITE NEGATIVE 12/24/2017 1750   LEUKOCYTESUR NEGATIVE 12/24/2017 1750   Sepsis Labs: @LABRCNTIP (procalcitonin:4,lacticacidven:4)  ) Recent Results (from the past 240 hour(s))    Respiratory Panel by RT PCR (Flu A&B, Covid) - Nasopharyngeal Swab     Status: None   Collection Time: 01/24/20  9:10 AM   Specimen: Nasopharyngeal Swab  Result Value Ref Range Status   SARS Coronavirus 2 by RT PCR NEGATIVE NEGATIVE Final    Comment: (NOTE) SARS-CoV-2 target nucleic acids are NOT DETECTED. The SARS-CoV-2 RNA is generally detectable in upper respiratoy specimens during the acute phase of infection. The lowest concentration of SARS-CoV-2 viral copies this assay can detect is 131 copies/mL. A negative result does not preclude SARS-Cov-2 infection and should not be used as the sole basis for treatment or other patient management decisions. A negative result may occur with  improper specimen collection/handling, submission of specimen other than nasopharyngeal swab, presence of viral mutation(s) within the areas targeted by this assay, and inadequate number of viral copies (<131 copies/mL). A negative result must be combined with  clinical observations, patient history, and epidemiological information. The expected result is Negative. Fact Sheet for Patients:  PinkCheek.be Fact Sheet for Healthcare Providers:  GravelBags.it This test is not yet ap proved or cleared by the [place] FDA and  has been authorized for detection and/or diagnosis of SARS-CoV-2 by FDA under an Emergency Use Authorization (EUA). This EUA will remain  in effect (meaning this test can be used) for the duration of the COVID-19 declaration under Section 564(b)(1) of the Act, 21 U.S.C. section 360bbb-3(b)(1), unless the authorization is terminated or revoked sooner.    Influenza A by PCR NEGATIVE NEGATIVE Final   Influenza B by PCR NEGATIVE NEGATIVE Final    Comment: (NOTE) The Xpert Xpress SARS-CoV-2/FLU/RSV assay is intended as an aid in  the diagnosis of influenza from Nasopharyngeal swab specimens and  should not be used as a sole  basis for treatment. Nasal washings and  aspirates are unacceptable for Xpert Xpress SARS-CoV-2/FLU/RSV  testing. Fact Sheet for Patients: PinkCheek.be Fact Sheet for Healthcare Providers: GravelBags.it This test is not yet approved or cleared by the [place] FDA and  has been authorized for detection and/or diagnosis of SARS-CoV-2 by  FDA under an Emergency Use Authorization (EUA). This EUA will remain  in effect (meaning this test can be used) for the duration of the  Covid-19 declaration under Section 564(b)(1) of the Act, 21  U.S.C. section 360bbb-3(b)(1), unless the authorization is  terminated or revoked. Performed at [facility], [address] [place], [place] [address]   Culture, sputum-assessment     Status: None   Collection Time: 01/24/20  8:06 PM   Specimen: Expectorated Sputum  Result Value Ref Range Status   Specimen Description EXPECTORATED SPUTUM  Final   Special Requests NONE  Final   Sputum evaluation   Final    THIS SPECIMEN IS ACCEPTABLE FOR SPUTUM CULTURE Performed at [facility], [address] [place], [place] [address]    Report Status 01/24/2020 FINAL  Final         Radiology Studies: [facility] Chest Port 1 View  Result Date: 01/24/2020 CLINICAL DATA:  Dyspnea EXAM: PORTABLE CHEST 1 VIEW COMPARISON:  11/01/2019 FINDINGS: Chronic lung disease with emphysema and fibrotic appearance. Increased markings at the bases which could be bronchitis or just poor inspiration. No edema, effusion, or pneumothorax. Normal heart size and stable mediastinal contours. IMPRESSION: Chronic lung disease with fibrosis by remote chest CT. Interstitial coarsening is more prominent than on prior and this could be from lower lung volumes or acute interstitial disease. Electronically Signed   By: Monte Fantasia M.D.   On: 01/24/2020 09:32   US ABDOMEN LIMITED RUQ  Result Date: 01/24/2020 CLINICAL  DATA:  Right upper quadrant abdominal pain EXAM: ULTRASOUND ABDOMEN LIMITED RIGHT UPPER QUADRANT COMPARISON:  None. FINDINGS: Gallbladder: No gallstones or wall thickening visualized. No sonographic Murphy sign noted by sonographer. Common bile duct: Diameter: Liver: Increased echotexture seen throughout. No focal abnormality or biliary ductal dilatation. Portal vein is patent on color Doppler imaging with normal direction of blood flow towards the liver. Other: None. IMPRESSION: Normal gallbladder. Hepatic steatosis Electronically Signed   By: Prudencio Pair M.D.   On: 01/24/2020 13:30        Scheduled Meds: . amLODipine  10 mg Oral Daily  . atorvastatin  40 mg Oral QHS  . calcium-vitamin D  2 tablet Oral Q1500  . dextromethorphan-guaiFENesin  1 tablet Oral BID  . folic acid  1 mg Oral  Daily  . ipratropium-albuterol  3 mL Nebulization Q4H  . loratadine  10 mg Oral Daily  . LORazepam  0-4 mg Intravenous Q6H   Followed by  . [START ON 01/26/2020] LORazepam  0-4 mg Intravenous Q12H  . magnesium oxide  400 mg Oral Q1500  . methylPREDNISolone (SOLU-MEDROL) injection  60 mg Intravenous Q12H  . metoprolol succinate  50 mg Oral Daily  . multivitamin with minerals  1 tablet Oral Daily  . pantoprazole  40 mg Oral BID  . thiamine  100 mg Oral Daily   Or  . thiamine  100 mg Intravenous Daily   Continuous Infusions: . sodium chloride 100 mL/hr at 01/25/20 0630     LOS: 1 day    Time spent: 35 minutes    Edwin Dada, MD [facility] 01/25/2020, 9:41 AM     Please page though [place] or Epic secure chat:  For [employer], Adult nurse

## 2020-01-25 NOTE — Progress Notes (Signed)
Patient on 2L [place] sats are 92% at rest in bed. Patient ambulated to nursing station and back to room on 2L [place] remained at 85% saturation. Per patient he feels weak and not strong enough to discharge home today.   Fuller Mandril, RN

## 2020-01-26 ENCOUNTER — Ambulatory Visit: Payer: Medicare HMO | Admitting: Pulmonary Disease

## 2020-01-26 LAB — CBC
HCT: 36.6 % — ABNORMAL LOW (ref 39.0–52.0)
Hemoglobin: 12.1 g/dL — ABNORMAL LOW (ref 13.0–17.0)
MCH: 32.9 pg (ref 26.0–34.0)
MCHC: 33.1 g/dL (ref 30.0–36.0)
MCV: 99.5 fL (ref 80.0–100.0)
Platelets: 428 10*3/uL — ABNORMAL HIGH (ref 150–400)
RBC: 3.68 MIL/uL — ABNORMAL LOW (ref 4.22–5.81)
RDW: 14.4 % (ref 11.5–15.5)
WBC: 12.4 10*3/uL — ABNORMAL HIGH (ref 4.0–10.5)
nRBC: 0 % (ref 0.0–0.2)

## 2020-01-26 LAB — BASIC METABOLIC PANEL
Anion gap: 12 (ref 5–15)
BUN: 19 mg/dL (ref 8–23)
CO2: 29 mmol/L (ref 22–32)
Calcium: 9.3 mg/dL (ref 8.9–10.3)
Chloride: 93 mmol/L — ABNORMAL LOW (ref 98–111)
Creatinine, Ser: 0.69 mg/dL (ref 0.61–1.24)
GFR calc Af Amer: >60 mL/min (ref >60)
GFR calc non Af Amer: >60 mL/min (ref >60)
Glucose, Bld: 156 mg/dL — ABNORMAL HIGH (ref 70–99)
Potassium: 4.5 mmol/L (ref 3.5–5.1)
Sodium: 134 mmol/L — ABNORMAL LOW (ref 135–145)

## 2020-01-26 LAB — T4, FREE: Free T4: 0.76 ng/dL (ref 0.61–1.12)

## 2020-01-26 MED ORDER — LOSARTAN POTASSIUM 50 MG PO TABS
100.0000 mg | ORAL_TABLET | Freq: Every day | ORAL | Status: DC
  Administered 2020-01-26 – 2020-01-29 (×4): 100 mg via ORAL
  Filled 2020-01-26 (×4): qty 2

## 2020-01-26 MED ORDER — AZITHROMYCIN 250 MG PO TABS
500.0000 mg | ORAL_TABLET | Freq: Every day | ORAL | Status: AC
  Administered 2020-01-26: 500 mg via ORAL
  Filled 2020-01-26: qty 2

## 2020-01-26 MED ORDER — AZITHROMYCIN 250 MG PO TABS
250.0000 mg | ORAL_TABLET | Freq: Every day | ORAL | Status: DC
  Administered 2020-01-27: 250 mg via ORAL
  Filled 2020-01-26: qty 1

## 2020-01-26 NOTE — Progress Notes (Signed)
PROGRESS NOTE    Austin Patterson  [id number] DOB: 1956-06-28 DOA: 01/24/2020 PCP: Laneta Simmers, NP      Brief Narrative:  Austin Patterson is a 64 y.o. M with COPD on 2 L oxygen at home, pulmonary fibrosis, hypertension, hyperlipidemia, GERD, CAD, stent placement, alcohol abuse, who presents with shortness breath.  Patient states that she has been having shortness of breath in the past several days, which has been progressively worsening.  He has to titrate up oxygen level from 2 L at baseline to 4 nasal cannula oxygen at home.  He has dry cough, but no fever or chills.  In the ER WBC 10.0, lipase 26, troponin 6, 5, BNP 42, negative COVID-19 PCR, sodium 123, renal function normal, temperature normal, blood pressure 175/91, heart rate 98, RR 18, oxygen saturation 97% on 2 L nasal cannula oxygen currently.  Chest x-ray showed chronic fibrotic change and interstitial lung disease. Pt is admitted to [facility] bed as inpatient.        Assessment & Plan:  Acute on chronic respiratory failure with hypoxia due to COPD exacerbation -Continue Solu-Medrol -Continue bronchodilators -Continue Mucinex    IPF (idiopathic pulmonary fibrosis)  Hyponatremia:  DM now normalized.  Likely from chronic respiratory distress, possibly alcohol use, dehydration.  Osmolalities and urine sodium evaluate, somewhat inappropriate ADH secretion.  GERD -Continue Protonix  RUQ pain LFTs, lipase normal.  Right upper quadrant ultrasound normal, pain resolved  Hypertension Pressure elevated today -Continue metoprolol -Restart losartan -Hold HCTZ  CAD (coronary artery disease) -Resume aspirin and Plavix at discharge -Continue Lipitor and metoprolol and ARB  Hyperlipidemia -Continue Lipitor  Epistaxis: Resolved with Afrin spray  Alcohol abuse: No evidence of withdrawal       Disposition: Status is: Inpatient  Remains inpatient appropriate because:Inpatient level of care  appropriate due to severity of illness with ongoing desaturation to 85% on home oxygen, and severe dyspnea with ambulating just a few feet.   Dispo: The patient is from: Home              Anticipated d/c is to: Home              Anticipated d/c date is: 1 day              Patient currently is not medically stable to d/c.                 MDM: The below labs and imaging reports reviewed and summarized above.  Medication management as above.  Is a severe exacerbation of his chronic disease  DVT prophylaxis: CDs Code Status: Full code Family Communication:     Consultants:   Procedures:     Antimicrobials:      Culture data:              Subjective: Patient remains quite dyspneic and wheezing.  He has no sputum, fever, confusion, chest pain.  He is overall very fatigued, unable to get out of bed and walk further than a few feet.  Objective: Vitals:   01/26/20 1041 01/26/20 1213 01/26/20 1557 01/26/20 1635  BP: (!) 174/91 (!) 192/89 (!) 155/75   Pulse: 75 94 92   Resp: 14 16    Temp: 97.7 F (36.5 C) 98.6 F (37 C)    TempSrc: Oral     SpO2: 97% 94% 95% 94%  Weight:      Height:        Intake/Output Summary (Last 24 hours) at 01/26/2020 1728 Last  data filed at 01/26/2020 1519 Gross per 24 hour  Intake 1081.52 ml  Output 3005 ml  Net -1923.48 ml   Filed Weights   01/24/20 0906  Weight: 97.5 kg    Examination: General appearance: Adult male, lying in bed, mild respiratory distress HEENT: Anicteric, grafting, lids and lashes normal.  No nasal forming, discharge, epistaxis.  Lips normal, dentures in place, oropharynx moist, no oral lesions, hearing normal. Skin: Skin warm and dry, no jaundice no suspicious rashes or lesions. Cardiac: Regular rate and rhythm, soft systolic murmur normal, no lower extremity Respiratory: Tachypneic at rest, bilateral severe wheezing, diminished respiratory sounds, no rales.   Abdomen: Soft, no tenderness  palpation or guarding, no signs of distention. MSK: No deformities or effusions. Neuro: Alert, extraocular intact, moves all extremities normal strength coordination, speech fluent    Psych: Sensorium intact and responding questions, attention normal, affect normal, judgment insight appear normal.    Data Reviewed: I have personally reviewed following labs and imaging studies:  CBC: Recent Labs  Lab 01/24/20 0909 01/26/20 0600  WBC 10.0 12.4*  NEUTROABS 7.9*  --   HGB 12.4* 12.1*  HCT 36.6* 36.6*  MCV 95.6 99.5  PLT 363 [id number]*   Basic Metabolic Panel: Recent Labs  Lab 01/24/20 0909 01/24/20 1537 01/25/20 0003 01/25/20 0720 01/26/20 0600  NA 123* 131* 128* 132* 134*  K 4.0 4.1 4.6 4.3 4.5  CL 83* 90* 89* 93* 93*  CO2 28 27 27 28 29   GLUCOSE 137* 185* 204* 191* 156*  BUN 10 10 18 13 19   CREATININE 0.71 0.71 0.79 0.60* 0.69  CALCIUM 9.1 9.5 8.9 9.1 9.3   GFR: Estimated Creatinine Clearance: 103.8 mL/min (by C-G formula based on SCr of 0.69 mg/dL). Liver Function Tests: Recent Labs  Lab 01/24/20 0909  AST 40  ALT 35  ALKPHOS 67  BILITOT 0.9  PROT 7.4  ALBUMIN 4.1   Recent Labs  Lab 01/24/20 0909  LIPASE 26   No results for input(s): AMMONIA in the last 168 hours. Coagulation Profile: No results for input(s): INR, PROTIME in the last 168 hours. Cardiac Enzymes: No results for input(s): CKTOTAL, CKMB, CKMBINDEX, TROPONINI in the last 168 hours. BNP (last 3 results) No results for input(s): PROBNP in the last 8760 hours. HbA1C: No results for input(s): HGBA1C in the last 72 hours. CBG: No results for input(s): GLUCAP in the last 168 hours. Lipid Profile: No results for input(s): CHOL, HDL, LDLCALC, TRIG, CHOLHDL, LDLDIRECT in the last 72 hours. Thyroid Function Tests: Recent Labs    01/25/20 0003 01/26/20 0600  TSH 0.263*  --   FREET4  --  0.76   Anemia Panel: No results for input(s): VITAMINB12, FOLATE, FERRITIN, TIBC, IRON, RETICCTPCT in the last  72 hours. Urine analysis:    Component Value Date/Time   COLORURINE YELLOW (A) 12/24/2017 1750   APPEARANCEUR HAZY (A) 12/24/2017 1750   LABSPEC 1.017 12/24/2017 1750   PHURINE 5.0 12/24/2017 1750   GLUCOSEU NEGATIVE 12/24/2017 1750   HGBUR NEGATIVE 12/24/2017 1750   BILIRUBINUR NEGATIVE 12/24/2017 1750   KETONESUR NEGATIVE 12/24/2017 1750   PROTEINUR 30 (A) 12/24/2017 1750   NITRITE NEGATIVE 12/24/2017 1750   LEUKOCYTESUR NEGATIVE 12/24/2017 1750   Sepsis Labs: @LABRCNTIP (procalcitonin:4,lacticacidven:4)  ) Recent Results (from the past 240 hour(s))  Respiratory Panel by RT PCR (Flu A&B, Covid) - Nasopharyngeal Swab     Status: None   Collection Time: 01/24/20  9:10 AM   Specimen: Nasopharyngeal Swab  Result Value Ref  Range Status   SARS Coronavirus 2 by RT PCR NEGATIVE NEGATIVE Final    Comment: (NOTE) SARS-CoV-2 target nucleic acids are NOT DETECTED. The SARS-CoV-2 RNA is generally detectable in upper respiratoy specimens during the acute phase of infection. The lowest concentration of SARS-CoV-2 viral copies this assay can detect is 131 copies/mL. A negative result does not preclude SARS-Cov-2 infection and should not be used as the sole basis for treatment or other patient management decisions. A negative result may occur with  improper specimen collection/handling, submission of specimen other than nasopharyngeal swab, presence of viral mutation(s) within the areas targeted by this assay, and inadequate number of viral copies (<131 copies/mL). A negative result must be combined with clinical observations, patient history, and epidemiological information. The expected result is Negative. Fact Sheet for Patients:  PinkCheek.be Fact Sheet for Healthcare Providers:  GravelBags.it This test is not yet ap proved or cleared by the [place] FDA and  has been authorized for detection and/or diagnosis of SARS-CoV-2  by FDA under an Emergency Use Authorization (EUA). This EUA will remain  in effect (meaning this test can be used) for the duration of the COVID-19 declaration under Section 564(b)(1) of the Act, 21 U.S.C. section 360bbb-3(b)(1), unless the authorization is terminated or revoked sooner.    Influenza A by PCR NEGATIVE NEGATIVE Final   Influenza B by PCR NEGATIVE NEGATIVE Final    Comment: (NOTE) The Xpert Xpress SARS-CoV-2/FLU/RSV assay is intended as an aid in  the diagnosis of influenza from Nasopharyngeal swab specimens and  should not be used as a sole basis for treatment. Nasal washings and  aspirates are unacceptable for Xpert Xpress SARS-CoV-2/FLU/RSV  testing. Fact Sheet for Patients: PinkCheek.be Fact Sheet for Healthcare Providers: GravelBags.it This test is not yet approved or cleared by the [place] FDA and  has been authorized for detection and/or diagnosis of SARS-CoV-2 by  FDA under an Emergency Use Authorization (EUA). This EUA will remain  in effect (meaning this test can be used) for the duration of the  Covid-19 declaration under Section 564(b)(1) of the Act, 21  U.S.C. section 360bbb-3(b)(1), unless the authorization is  terminated or revoked. Performed at [facility], [address] [place], [place] [address]   Culture, sputum-assessment     Status: None   Collection Time: 01/24/20  8:06 PM   Specimen: Expectorated Sputum  Result Value Ref Range Status   Specimen Description EXPECTORATED SPUTUM  Final   Special Requests NONE  Final   Sputum evaluation   Final    THIS SPECIMEN IS ACCEPTABLE FOR SPUTUM CULTURE Performed at [facility], [address], [place], [place] [address]    Report Status 01/24/2020 FINAL  Final  Culture, respiratory     Status: None (Preliminary result)   Collection Time: 01/24/20  8:06 PM  Result Value Ref Range Status   Specimen Description    Final    EXPECTORATED SPUTUM Performed at [facility], [address] [place], [place] [address]    Special Requests   Final    NONE Reflexed from [id number] Performed at [facility], [address] [place], [place] [address]    Gram Stain   Final    FEW WBC PRESENT, PREDOMINANTLY MONONUCLEAR ABUNDANT GRAM POSITIVE COCCI ABUNDANT GRAM NEGATIVE RODS FEW GRAM VARIABLE ROD FEW SQUAMOUS EPITHELIAL CELLS PRESENT Performed at [facility], [place] [address] [place], [place] [address]    Culture FEW STENOTROPHOMONAS MALTOPHILIA  Final  Report Status PENDING  Incomplete         Radiology Studies: No results found.      Scheduled Meds: . amLODipine  10 mg Oral Daily  . atorvastatin  40 mg Oral QHS  . [START ON 01/27/2020] azithromycin  250 mg Oral Daily  . calcium-vitamin D  2 tablet Oral Q1500  . dextromethorphan-guaiFENesin  1 tablet Oral BID  . folic acid  1 mg Oral Daily  . ipratropium-albuterol  3 mL Nebulization Q4H  . loratadine  10 mg Oral Daily  . magnesium oxide  400 mg Oral Q1500  . methylPREDNISolone (SOLU-MEDROL) injection  60 mg Intravenous Q12H  . metoprolol succinate  50 mg Oral Daily  . multivitamin with minerals  1 tablet Oral Daily  . pantoprazole  40 mg Oral BID  . thiamine  100 mg Oral Daily   Or  . thiamine  100 mg Intravenous Daily   Continuous Infusions:    LOS: 2 days    Time spent: 35 minutes minutes    Edwin Dada, MD [facility] 01/26/2020, 5:28 PM     Please page though [place] or Epic secure chat:  For [employer], Adult nurse

## 2020-01-26 NOTE — Progress Notes (Signed)
Patient's B/P earlier in the shift was 192/89 with a HR of 94; Dr. Loleta Books aware and prn hydralazine administered.  This afternoon the patient walked in the hall and his oxygen saturation dropped to the mid 80s, oxygen increased to 3L and patient's oxygen saturation came up to 87%.  Dr. Loleta Books also aware of these findings and came up and spoke with the patient.

## 2020-01-26 NOTE — Plan of Care (Signed)
Continuing with plan of care. 

## 2020-01-26 NOTE — Care Management Important Message (Signed)
Important Message  Patient Details  Name: Austin Patterson MRN: [id number] Date of Birth: 1956/05/15   Medicare Important Message Given:  Yes     Dannette Barbara 01/26/2020, 12:07 PM

## 2020-01-27 LAB — CULTURE, RESPIRATORY W GRAM STAIN

## 2020-01-27 MED ORDER — HYDROCHLOROTHIAZIDE 12.5 MG PO CAPS
12.5000 mg | ORAL_CAPSULE | Freq: Every day | ORAL | Status: DC
  Administered 2020-01-27 – 2020-01-29 (×3): 12.5 mg via ORAL
  Filled 2020-01-27 (×3): qty 1

## 2020-01-27 MED ORDER — SULFAMETHOXAZOLE-TRIMETHOPRIM 800-160 MG PO TABS
1.0000 | ORAL_TABLET | Freq: Two times a day (BID) | ORAL | Status: DC
  Administered 2020-01-27 – 2020-01-29 (×5): 1 via ORAL
  Filled 2020-01-27 (×8): qty 1

## 2020-01-27 NOTE — Plan of Care (Signed)
Continuing with plan of care. 

## 2020-01-27 NOTE — Progress Notes (Signed)
Patient ambulated earlier in the shift and oxygen saturation dropped to 86%, patient encouraged to focus on breathing as patient was trying to walk and talk at the same time.  Even with encouragement and education as to how this may benefit his oxygen saturation while ambulating he still was talkative and unfocused on breathing.

## 2020-01-27 NOTE — Progress Notes (Signed)
PROGRESS NOTE    Austin Patterson  [id number] DOB: 1956-09-11 DOA: 01/24/2020 PCP: Laneta Simmers, NP      Brief Narrative:  Austin Patterson is a 65 y.o. M with COPD on 2 L oxygen at home, pulmonary fibrosis, hypertension, hyperlipidemia, GERD, CAD, stent placement, alcohol abuse, who presents with shortness breath.  Patient states that she has been having shortness of breath in the past several days, which has been progressively worsening.  He has to titrate up oxygen level from 2 L at baseline to 4 nasal cannula oxygen at home.  He has dry cough, but no fever or chills.  In the ER WBC 10.0, lipase 26, troponin 6, 5, BNP 42, negative COVID-19 PCR, sodium 123, renal function normal, temperature normal, blood pressure 175/91, heart rate 98, RR 18, oxygen saturation 97% on 2 L nasal cannula oxygen currently.  Chest x-ray showed chronic fibrotic change and interstitial lung disease. Pt is admitted to [facility] bed as inpatient.        Assessment & Plan:  Acute on chronic respiratory failure with hypoxia due to COPD exacerbation Acute bronchitis with stenotrophomonas Patient admitted and started steroids and frequent bronchodilators.  He had minimal improvement.  Sputum culture started to grow stenotrophomonas.  This was discussed by phone with pulmonology recommended Bactrim DS twice daily for 10 days. -Continue Solu-Medrol -Continue bronchodilators, scheduled. -Continue Mucinex -Start Bactrim DS twice daily for 10 days, day 1    IPF (idiopathic pulmonary fibrosis) CT chest imaging from 2017 reviewed.  The patient has severe emphysema, large blebs, severe interstitial lung disease bilaterally, and obesity related restrictive lung disease.  Hyponatremia Sodium normalized. Likely from chronic respiratory distress, possibly alcohol use, dehydration.    GERD -Continue Protonix  RUQ pain LFTs, lipase normal.  Right upper quadrant ultrasound normal, pain  resolved  Hypertension BP slightly elevated -Continue metoprolol, losartan -Restart HCTZ  CAD (coronary artery disease) -Resume aspirin and Plavix at discharge -Continue Lipitor and metoprolol and ARB  Hyperlipidemia -Continue Lipitor  Epistaxis: Resolved with Afrin spray  Alcohol abuse: No evidence of withdrawal       Disposition: Status is: Inpatient  Remains inpatient appropriate because:Inpatient level of care appropriate due to severity of illness with ongoing desaturation to 85% on home oxygen, and severe dyspnea with ambulating just a few feet.   Dispo: The patient is from: Home              Anticipated d/c is to: Home              Anticipated d/c date is: 1 day              Patient currently is not medically stable to d/c.                 MDM: The below labs and imaging reports reviewed and summarized above.  Medication management as above.  Is a severe exacerbation of his chronic disease  DVT prophylaxis: CDs Code Status: Full code Family Communication:     Consultants:   Procedures:     Antimicrobials:      Culture data:              Subjective: The patient remains severely dyspneic with minimal exertion.  He has cough persistent.  He has no swelling, fever, chills.  Objective: Vitals:   01/26/20 2312 01/27/20 0402 01/27/20 0529 01/27/20 1215  BP:   (!) 143/76 (!) 155/87  Pulse:   93 86  Resp:   19  19  Temp:   97.9 F (36.6 C) 98 F (36.7 C)  TempSrc:   Oral Oral  SpO2: 96% 96% 98% 95%  Weight:      Height:        Intake/Output Summary (Last 24 hours) at 01/27/2020 1430 Last data filed at 01/27/2020 1423 Gross per 24 hour  Intake 840 ml  Output 950 ml  Net -110 ml   Filed Weights   01/24/20 0906  Weight: 97.5 kg    Examination: General appearance: Adult obese male, sitting in recliner, no acute distress. HEENT: Anicteric, conjunctive are normal, lids and lashes normal.  No nasal deformity,  discharge, or epistaxis.  Oropharynx moist, no oral lesions, hearing normal. Skin: Skin warm and dry, no suspicious rashes or lesions. Cardiac: Regular rate and rhythm, soft systolic murmur, no lower extremity edema, JVP not visible due to body habitus Respiratory: Tachypneic at rest, severe wheezing bilaterally, diminished respiratory sounds, no rales Abdomen: Abdomen soft without tenderness palpation or guarding, no distention or hepatosplenomegaly  MSK: No deformities or effusions, muscle bulk and tone is slightly diminished Neuro: Alert, extraocular movements intact, moves all extremities with normal strength and coordination, may be generalized weakness, speech is fluent.    Psych: Sensorium is intact and responding to questions, attention normal, affect normal, judgment insight appear normal.    Data Reviewed: I have personally reviewed following labs and imaging studies:  CBC: Recent Labs  Lab 01/24/20 0909 01/26/20 0600  WBC 10.0 12.4*  NEUTROABS 7.9*  --   HGB 12.4* 12.1*  HCT 36.6* 36.6*  MCV 95.6 99.5  PLT 363 [id number]*   Basic Metabolic Panel: Recent Labs  Lab 01/24/20 0909 01/24/20 1537 01/25/20 0003 01/25/20 0720 01/26/20 0600  NA 123* 131* 128* 132* 134*  K 4.0 4.1 4.6 4.3 4.5  CL 83* 90* 89* 93* 93*  CO2 28 27 27 28 29   GLUCOSE 137* 185* 204* 191* 156*  BUN 10 10 18 13 19   CREATININE 0.71 0.71 0.79 0.60* 0.69  CALCIUM 9.1 9.5 8.9 9.1 9.3   GFR: Estimated Creatinine Clearance: 103.8 mL/min (by C-G formula based on SCr of 0.69 mg/dL). Liver Function Tests: Recent Labs  Lab 01/24/20 0909  AST 40  ALT 35  ALKPHOS 67  BILITOT 0.9  PROT 7.4  ALBUMIN 4.1   Recent Labs  Lab 01/24/20 0909  LIPASE 26   No results for input(s): AMMONIA in the last 168 hours. Coagulation Profile: No results for input(s): INR, PROTIME in the last 168 hours. Cardiac Enzymes: No results for input(s): CKTOTAL, CKMB, CKMBINDEX, TROPONINI in the last 168 hours. BNP (last 3  results) No results for input(s): PROBNP in the last 8760 hours. HbA1C: No results for input(s): HGBA1C in the last 72 hours. CBG: No results for input(s): GLUCAP in the last 168 hours. Lipid Profile: No results for input(s): CHOL, HDL, LDLCALC, TRIG, CHOLHDL, LDLDIRECT in the last 72 hours. Thyroid Function Tests: Recent Labs    01/25/20 0003 01/26/20 0600  TSH 0.263*  --   FREET4  --  0.76   Anemia Panel: No results for input(s): VITAMINB12, FOLATE, FERRITIN, TIBC, IRON, RETICCTPCT in the last 72 hours. Urine analysis:    Component Value Date/Time   COLORURINE YELLOW (A) 12/24/2017 1750   APPEARANCEUR HAZY (A) 12/24/2017 1750   LABSPEC 1.017 12/24/2017 1750   PHURINE 5.0 12/24/2017 1750   GLUCOSEU NEGATIVE 12/24/2017 1750   HGBUR NEGATIVE 12/24/2017 1750   BILIRUBINUR NEGATIVE 12/24/2017 1750   KETONESUR NEGATIVE  12/24/2017 1750   PROTEINUR 30 (A) 12/24/2017 1750   NITRITE NEGATIVE 12/24/2017 1750   LEUKOCYTESUR NEGATIVE 12/24/2017 1750   Sepsis Labs: @LABRCNTIP (procalcitonin:4,lacticacidven:4)  ) Recent Results (from the past 240 hour(s))  Respiratory Panel by RT PCR (Flu A&B, Covid) - Nasopharyngeal Swab     Status: None   Collection Time: 01/24/20  9:10 AM   Specimen: Nasopharyngeal Swab  Result Value Ref Range Status   SARS Coronavirus 2 by RT PCR NEGATIVE NEGATIVE Final    Comment: (NOTE) SARS-CoV-2 target nucleic acids are NOT DETECTED. The SARS-CoV-2 RNA is generally detectable in upper respiratoy specimens during the acute phase of infection. The lowest concentration of SARS-CoV-2 viral copies this assay can detect is 131 copies/mL. A negative result does not preclude SARS-Cov-2 infection and should not be used as the sole basis for treatment or other patient management decisions. A negative result may occur with  improper specimen collection/handling, submission of specimen other than nasopharyngeal swab, presence of viral mutation(s) within the areas  targeted by this assay, and inadequate number of viral copies (<131 copies/mL). A negative result must be combined with clinical observations, patient history, and epidemiological information. The expected result is Negative. Fact Sheet for Patients:  PinkCheek.be Fact Sheet for Healthcare Providers:  GravelBags.it This test is not yet ap proved or cleared by the [place] FDA and  has been authorized for detection and/or diagnosis of SARS-CoV-2 by FDA under an Emergency Use Authorization (EUA). This EUA will remain  in effect (meaning this test can be used) for the duration of the COVID-19 declaration under Section 564(b)(1) of the Act, 21 U.S.C. section 360bbb-3(b)(1), unless the authorization is terminated or revoked sooner.    Influenza A by PCR NEGATIVE NEGATIVE Final   Influenza B by PCR NEGATIVE NEGATIVE Final    Comment: (NOTE) The Xpert Xpress SARS-CoV-2/FLU/RSV assay is intended as an aid in  the diagnosis of influenza from Nasopharyngeal swab specimens and  should not be used as a sole basis for treatment. Nasal washings and  aspirates are unacceptable for Xpert Xpress SARS-CoV-2/FLU/RSV  testing. Fact Sheet for Patients: PinkCheek.be Fact Sheet for Healthcare Providers: GravelBags.it This test is not yet approved or cleared by the [place] FDA and  has been authorized for detection and/or diagnosis of SARS-CoV-2 by  FDA under an Emergency Use Authorization (EUA). This EUA will remain  in effect (meaning this test can be used) for the duration of the  Covid-19 declaration under Section 564(b)(1) of the Act, 21  U.S.C. section 360bbb-3(b)(1), unless the authorization is  terminated or revoked. Performed at [facility], [address] [place], [place] [address]   Culture, sputum-assessment     Status: None   Collection Time: 01/24/20   8:06 PM   Specimen: Expectorated Sputum  Result Value Ref Range Status   Specimen Description EXPECTORATED SPUTUM  Final   Special Requests NONE  Final   Sputum evaluation   Final    THIS SPECIMEN IS ACCEPTABLE FOR SPUTUM CULTURE Performed at [facility], [address], [place], [place] [address]    Report Status 01/24/2020 FINAL  Final  Culture, respiratory     Status: None   Collection Time: 01/24/20  8:06 PM  Result Value Ref Range Status   Specimen Description   Final    EXPECTORATED SPUTUM Performed at [facility], [address] [place], [place] [address]    Special Requests   Final    NONE Reflexed from [id number] Performed  at [facility], [address] [place], [place] [address]    Gram Stain   Final    FEW WBC PRESENT, PREDOMINANTLY MONONUCLEAR ABUNDANT GRAM POSITIVE COCCI ABUNDANT GRAM NEGATIVE RODS FEW GRAM VARIABLE ROD FEW SQUAMOUS EPITHELIAL CELLS PRESENT    Culture   Final    FEW STENOTROPHOMONAS MALTOPHILIA WITH IN NORMAL RESPIRATORY FLORA Performed at [facility], [place] [address] [place], [place] [address]    Report Status 01/27/2020 FINAL  Final   Organism ID, Bacteria STENOTROPHOMONAS MALTOPHILIA  Final      Susceptibility   Stenotrophomonas maltophilia - MIC*    LEVOFLOXACIN 0.5 SENSITIVE Sensitive     TRIMETH/SULFA <=20 SENSITIVE Sensitive     * FEW STENOTROPHOMONAS MALTOPHILIA         Radiology Studies: No results found.      Scheduled Meds: . amLODipine  10 mg Oral Daily  . atorvastatin  40 mg Oral QHS  . calcium-vitamin D  2 tablet Oral Q1500  . dextromethorphan-guaiFENesin  1 tablet Oral BID  . folic acid  1 mg Oral Daily  . ipratropium-albuterol  3 mL Nebulization Q4H  . loratadine  10 mg Oral Daily  . losartan  100 mg Oral Daily  . magnesium oxide  400 mg Oral Q1500  . methylPREDNISolone (SOLU-MEDROL) injection  60 mg Intravenous Q12H  . metoprolol succinate  50 mg Oral Daily  .  multivitamin with minerals  1 tablet Oral Daily  . pantoprazole  40 mg Oral BID  . sulfamethoxazole-trimethoprim  1 tablet Oral Q12H  . thiamine  100 mg Oral Daily   Or  . thiamine  100 mg Intravenous Daily   Continuous Infusions:    LOS: 3 days    Time spent: 35 minutes    Edwin Dada, MD [facility] 01/27/2020, 2:30 PM     Please page though [place] or Epic secure chat:  For [employer], Adult nurse

## 2020-01-28 ENCOUNTER — Inpatient Hospital Stay: Payer: Medicare HMO

## 2020-01-28 LAB — COMPREHENSIVE METABOLIC PANEL
ALT: 51 U/L — ABNORMAL HIGH (ref 0–44)
AST: 45 U/L — ABNORMAL HIGH (ref 15–41)
Albumin: 3.8 g/dL (ref 3.5–5.0)
Alkaline Phosphatase: 60 U/L (ref 38–126)
Anion gap: 11 (ref 5–15)
BUN: 22 mg/dL (ref 8–23)
CO2: 29 mmol/L (ref 22–32)
Calcium: 9.3 mg/dL (ref 8.9–10.3)
Chloride: 91 mmol/L — ABNORMAL LOW (ref 98–111)
Creatinine, Ser: 0.68 mg/dL (ref 0.61–1.24)
GFR calc Af Amer: >60 mL/min (ref >60)
GFR calc non Af Amer: >60 mL/min (ref >60)
Glucose, Bld: 132 mg/dL — ABNORMAL HIGH (ref 70–99)
Potassium: 4.7 mmol/L (ref 3.5–5.1)
Sodium: 131 mmol/L — ABNORMAL LOW (ref 135–145)
Total Bilirubin: 0.6 mg/dL (ref 0.3–1.2)
Total Protein: 6.9 g/dL (ref 6.5–8.1)

## 2020-01-28 LAB — CBC
HCT: 38 % — ABNORMAL LOW (ref 39.0–52.0)
Hemoglobin: 12.7 g/dL — ABNORMAL LOW (ref 13.0–17.0)
MCH: 32.3 pg (ref 26.0–34.0)
MCHC: 33.4 g/dL (ref 30.0–36.0)
MCV: 96.7 fL (ref 80.0–100.0)
Platelets: 419 10*3/uL — ABNORMAL HIGH (ref 150–400)
RBC: 3.93 MIL/uL — ABNORMAL LOW (ref 4.22–5.81)
RDW: 14 % (ref 11.5–15.5)
WBC: 13.9 10*3/uL — ABNORMAL HIGH (ref 4.0–10.5)
nRBC: 0 % (ref 0.0–0.2)

## 2020-01-28 LAB — GLUCOSE, CAPILLARY: Glucose-Capillary: 141 mg/dL — ABNORMAL HIGH (ref 70–99)

## 2020-01-28 MED ORDER — MORPHINE SULFATE 10 MG/5ML PO SOLN: 5.0000 mg | ORAL | Status: DC | PRN

## 2020-01-28 NOTE — Progress Notes (Signed)
PROGRESS NOTE    OCTAVIA BRONKEMA  [id number] DOB: 04/07/1956 DOA: 01/24/2020 PCP: Laneta Simmers, NP      Brief Narrative:  Mr. Moher is a 64 y.o. M with COPD on 2 L oxygen at home, pulmonary fibrosis, hypertension, hyperlipidemia, GERD, CAD, stent placement, alcohol abuse, who presents with shortness breath.  Patient states that she has been having shortness of breath in the past several days, which has been progressively worsening.  He has to titrate up oxygen level from 2 L at baseline to 4 nasal cannula oxygen at home.  He has dry cough, but no fever or chills.  In the ER WBC 10.0, lipase 26, troponin 6, 5, BNP 42, negative COVID-19 PCR, sodium 123, renal function normal, temperature normal, blood pressure 175/91, heart rate 98, RR 18, oxygen saturation 97% on 2 L nasal cannula oxygen currently.  Chest x-ray showed chronic fibrotic change and interstitial lung disease. Pt is admitted to [facility] bed as inpatient.        Assessment & Plan:  Acute on chronic respiratory failure with hypoxia due to COPD exacerbation Acute bronchitis with stenotrophomonas Patient admitted and started steroids and frequent bronchodilators.  He had minimal improvement.  Sputum culture started to grow stenotrophomonas.  This was discussed by phone with pulmonology recommended Bactrim DS twice daily for 10 days. -Continue Solu-Medrol -Continue bronchodilators, scheduled. -Continue Mucinex  -Continue Bactrim DS twice daily day 2 of 10 -Obtain CT chest -Consult Palliative Care -Continue Protonix    IPF (idiopathic pulmonary fibrosis)  Hyponatremia Resolved. Likely from chronic respiratory distress, possibly alcohol use, dehydration.    RUQ pain LFTs, lipase normal.  Right upper quadrant ultrasound normal, pain resolved  Hypertension BP elevated -Continue metoprolol, losartan, HCTZ   CAD (coronary artery disease) -Resume aspirin and Plavix at discharge -Continue   Lipitor and metoprolol and ARB  Epistaxis: Resolved with Afrin spray  Alcohol abuse: No evidence of withdrawal       Disposition: Status is: Inpatient  Remains inpatient appropriate because:Inpatient level of care appropriate due to severity of illness with ongoing desaturation to 85% on home oxygen, and severe dyspnea with ambulating just a few feet.   Dispo: The patient is from: Home              Anticipated d/c is to: Home              Anticipated d/c date is: 1 day              Patient currently is not medically stable to d/c.                 MDM: The below labs and imaging reports reviewed and summarized above.  Medication management as above.  Is a severe exacerbation of his chronic disease  DVT prophylaxis: CDs Code Status: Full code Family Communication:     Consultants:   Procedures:     Antimicrobials:      Culture data:              Subjective: The patient remains red-faced and dyspnea with exertion.  Cough no change.  No fever.  No vomiting.  No confusion.  Objective: Vitals:   01/28/20 0409 01/28/20 0923 01/28/20 0930 01/28/20 1256  BP: 109/85 (!) 145/85  (!) 147/78  Pulse: (!) 101  97 91  Resp: 18   20  Temp: 97.8 F (36.6 C)   98.3 F (36.8 C)  TempSrc: Oral   Oral  SpO2: 96%  97%  Weight:      Height:        Intake/Output Summary (Last 24 hours) at 01/28/2020 1649 Last data filed at 01/28/2020 1000 Gross per 24 hour  Intake 960 ml  Output 1425 ml  Net -465 ml   Filed Weights   01/24/20 0906  Weight: 97.5 kg    Examination: General appearance: Obese adult male, alert and in no obvious distress at rest, sitting in recliner.   HEENT: Anicteric, conjunctiva pink, lids and lashes normal. No nasal deformity, discharge, epistaxis.  Lips moist, teeth normal. OP moist, no oral lesions.  Hearing normal. Skin: Warm and dry.  No suspicious rashes or lesions. Cardiac: RRR, no murmurs appreciated.  No LE edema.      Respiratory: Tachypneic, no accessory muscle use, appears dyspneic with exertion, severe wheezing bilaterally Abdomen: Abdomen soft.  No tenderness to palpation or guarding. No ascites, distension, hepatosplenomegaly.   MSK: No deformities or effusions of the large joints of the upper or lower extremities bilaterally. Neuro: Awake and alert. Naming is grossly intact, and the patient's recall, recent and remote, as well as general fund of knowledge seem within normal limits.  Muscle tone normal, without fasciculations.  Moves all extremities equally and with normal coordination.  Speech fluent.    Psych: Sensorium intact and responding to questions, attention normal. Affect blunted.  Judgment and insight appear normal.      Data Reviewed: I have personally reviewed following labs and imaging studies:  CBC: Recent Labs  Lab 01/24/20 0909 01/26/20 0600 01/28/20 0627  WBC 10.0 12.4* 13.9*  NEUTROABS 7.9*  --   --   HGB 12.4* 12.1* 12.7*  HCT 36.6* 36.6* 38.0*  MCV 95.6 99.5 96.7  PLT 363 428* 123XX123*   Basic Metabolic Panel: Recent Labs  Lab 01/24/20 1537 01/25/20 0003 01/25/20 0720 01/26/20 0600 01/28/20 0627  NA 131* 128* 132* 134* 131*  K 4.1 4.6 4.3 4.5 4.7  CL 90* 89* 93* 93* 91*  CO2 27 27 28 29 29   GLUCOSE 185* 204* 191* 156* 132*  BUN 10 18 13 19 22   CREATININE 0.71 0.79 0.60* 0.69 0.68  CALCIUM 9.5 8.9 9.1 9.3 9.3   GFR: Estimated Creatinine Clearance: 103.8 mL/min (by C-G formula based on SCr of 0.68 mg/dL). Liver Function Tests: Recent Labs  Lab 01/24/20 0909 01/28/20 0627  AST 40 45*  ALT 35 51*  ALKPHOS 67 60  BILITOT 0.9 0.6  PROT 7.4 6.9  ALBUMIN 4.1 3.8   Recent Labs  Lab 01/24/20 0909  LIPASE 26   No results for input(s): AMMONIA in the last 168 hours. Coagulation Profile: No results for input(s): INR, PROTIME in the last 168 hours. Cardiac Enzymes: No results for input(s): CKTOTAL, CKMB, CKMBINDEX, TROPONINI in the last 168 hours. BNP  (last 3 results) No results for input(s): PROBNP in the last 8760 hours. HbA1C: No results for input(s): HGBA1C in the last 72 hours. CBG: No results for input(s): GLUCAP in the last 168 hours. Lipid Profile: No results for input(s): CHOL, HDL, LDLCALC, TRIG, CHOLHDL, LDLDIRECT in the last 72 hours. Thyroid Function Tests: Recent Labs    01/26/20 0600  FREET4 0.76   Anemia Panel: No results for input(s): VITAMINB12, FOLATE, FERRITIN, TIBC, IRON, RETICCTPCT in the last 72 hours. Urine analysis:    Component Value Date/Time   COLORURINE YELLOW (A) 12/24/2017 1750   APPEARANCEUR HAZY (A) 12/24/2017 1750   LABSPEC 1.017 12/24/2017 1750   PHURINE 5.0 12/24/2017 1750  GLUCOSEU NEGATIVE 12/24/2017 1750   HGBUR NEGATIVE 12/24/2017 1750   BILIRUBINUR NEGATIVE 12/24/2017 1750   KETONESUR NEGATIVE 12/24/2017 1750   PROTEINUR 30 (A) 12/24/2017 1750   NITRITE NEGATIVE 12/24/2017 1750   LEUKOCYTESUR NEGATIVE 12/24/2017 1750   Sepsis Labs: @LABRCNTIP (procalcitonin:4,lacticacidven:4)  ) Recent Results (from the past 240 hour(s))  Respiratory Panel by RT PCR (Flu A&B, Covid) - Nasopharyngeal Swab     Status: None   Collection Time: 01/24/20  9:10 AM   Specimen: Nasopharyngeal Swab  Result Value Ref Range Status   SARS Coronavirus 2 by RT PCR NEGATIVE NEGATIVE Final    Comment: (NOTE) SARS-CoV-2 target nucleic acids are NOT DETECTED. The SARS-CoV-2 RNA is generally detectable in upper respiratoy specimens during the acute phase of infection. The lowest concentration of SARS-CoV-2 viral copies this assay can detect is 131 copies/mL. A negative result does not preclude SARS-Cov-2 infection and should not be used as the sole basis for treatment or other patient management decisions. A negative result may occur with  improper specimen collection/handling, submission of specimen other than nasopharyngeal swab, presence of viral mutation(s) within the areas targeted by this assay, and  inadequate number of viral copies (<131 copies/mL). A negative result must be combined with clinical observations, patient history, and epidemiological information. The expected result is Negative. Fact Sheet for Patients:  PinkCheek.be Fact Sheet for Healthcare Providers:  GravelBags.it This test is not yet ap proved or cleared by the [place] FDA and  has been authorized for detection and/or diagnosis of SARS-CoV-2 by FDA under an Emergency Use Authorization (EUA). This EUA will remain  in effect (meaning this test can be used) for the duration of the COVID-19 declaration under Section 564(b)(1) of the Act, 21 U.S.C. section 360bbb-3(b)(1), unless the authorization is terminated or revoked sooner.    Influenza A by PCR NEGATIVE NEGATIVE Final   Influenza B by PCR NEGATIVE NEGATIVE Final    Comment: (NOTE) The Xpert Xpress SARS-CoV-2/FLU/RSV assay is intended as an aid in  the diagnosis of influenza from Nasopharyngeal swab specimens and  should not be used as a sole basis for treatment. Nasal washings and  aspirates are unacceptable for Xpert Xpress SARS-CoV-2/FLU/RSV  testing. Fact Sheet for Patients: PinkCheek.be Fact Sheet for Healthcare Providers: GravelBags.it This test is not yet approved or cleared by the [place] FDA and  has been authorized for detection and/or diagnosis of SARS-CoV-2 by  FDA under an Emergency Use Authorization (EUA). This EUA will remain  in effect (meaning this test can be used) for the duration of the  Covid-19 declaration under Section 564(b)(1) of the Act, 21  U.S.C. section 360bbb-3(b)(1), unless the authorization is  terminated or revoked. Performed at [facility], [address] [place], [place] [address]   Culture, sputum-assessment     Status: None   Collection Time: 01/24/20  8:06 PM   Specimen:  Expectorated Sputum  Result Value Ref Range Status   Specimen Description EXPECTORATED SPUTUM  Final   Special Requests NONE  Final   Sputum evaluation   Final    THIS SPECIMEN IS ACCEPTABLE FOR SPUTUM CULTURE Performed at [facility], [address] [place], [place] [address]    Report Status 01/24/2020 FINAL  Final  Culture, respiratory     Status: None   Collection Time: 01/24/20  8:06 PM  Result Value Ref Range Status   Specimen Description   Final    EXPECTORATED SPUTUM Performed at [facility], [address] [place], [place] [address]    Special Requests   Final    NONE Reflexed from [id number] Performed at [facility], [address] [place], [place] [address]    Gram Stain   Final    FEW WBC PRESENT, PREDOMINANTLY MONONUCLEAR ABUNDANT GRAM POSITIVE COCCI ABUNDANT GRAM NEGATIVE RODS FEW GRAM VARIABLE ROD FEW SQUAMOUS EPITHELIAL CELLS PRESENT    Culture   Final    FEW STENOTROPHOMONAS MALTOPHILIA WITH IN NORMAL RESPIRATORY FLORA Performed at [facility], [place] [address] [place], [place] [address]    Report Status 01/27/2020 FINAL  Final   Organism ID, Bacteria STENOTROPHOMONAS MALTOPHILIA  Final      Susceptibility   Stenotrophomonas maltophilia - MIC*    LEVOFLOXACIN 0.5 SENSITIVE Sensitive     TRIMETH/SULFA <=20 SENSITIVE Sensitive     * FEW STENOTROPHOMONAS MALTOPHILIA         Radiology Studies: No results found.      Scheduled Meds: . amLODipine  10 mg Oral Daily  . atorvastatin  40 mg Oral QHS  . calcium-vitamin D  2 tablet Oral Q1500  . dextromethorphan-guaiFENesin  1 tablet Oral BID  . folic acid  1 mg Oral Daily  . hydrochlorothiazide  12.5 mg Oral Daily  . ipratropium-albuterol  3 mL Nebulization Q4H  . loratadine  10 mg Oral Daily  . losartan  100 mg Oral Daily  . magnesium oxide  400 mg Oral Q1500  . methylPREDNISolone (SOLU-MEDROL) injection  60 mg Intravenous Q12H  . metoprolol succinate  50 mg  Oral Daily  . multivitamin with minerals  1 tablet Oral Daily  . pantoprazole  40 mg Oral BID  . sulfamethoxazole-trimethoprim  1 tablet Oral Q12H  . thiamine  100 mg Oral Daily   Or  . thiamine  100 mg Intravenous Daily   Continuous Infusions:    LOS: 4 days    Time spent: 35 minutes    Edwin Dada, MD [facility] 01/28/2020, 4:49 PM     Please page though [place] or Epic secure chat:  For [employer], Adult nurse

## 2020-01-28 NOTE — Plan of Care (Addendum)
Patient walked with PT this morning and desated to the mid 80s and it took 3 minutes for the patient to come back up to 92% at rest.  Patient was red in the face when standing up.  Dr. Loleta Books updated.  Continuing with plan of care.

## 2020-01-28 NOTE — Evaluation (Signed)
Physical Therapy Evaluation Patient Details Name: Austin Patterson MRN: [id number] DOB: 1956/06/13 Today's Date: 01/28/2020   History of Present Illness  Per chart review: Pt is 64 y.o. male who presented to the ED on 01/24/2020 with complaints of SOB, nose bleed and some upper right abdominal pain. Current MD assessment includes: Acute on chronic respiratory failure with hypoxia due to COPD exacerbation and acute bronchitis with stenotrophomonas. PMH includes: CAD s/p stent, COPD on 2L, IPF, hypertension, HLD. and GERD.   Clinical Impression  Pt pleasant and motivated to participate during the session. Pt found on 2LO2/min with a SpO2 in the mid 90's. After ambulating short distances in the room, pt's SpO2 remained in the low-mid 90's. With a longer distance (100 feet), pt desaturated down to 85% and took about 3 minutes of rest with breathing coaching for his SpO2 to fully return to the mid 90's- nsg aware. Pt did not require any physical assistance t/o the session but was limited by his oxygen saturation. Pt will benefit from HHPT services upon discharge to safely address deficits listed in patient problem list for decreased caregiver assistance and eventual return to PLOF.       Follow Up Recommendations Home health PT    Equipment Recommendations  None recommended by PT    Recommendations for Other Services       Precautions / Restrictions Precautions Precautions: None Restrictions Weight Bearing Restrictions: No      Mobility  Bed Mobility               General bed mobility comments: Session began and ended in recliner  Transfers Overall transfer level: Modified independent               General transfer comment: Extra time and effort  Ambulation/Gait Ambulation/Gait assistance: Supervision Gait Distance (Feet): 100 Feetx1, 12 feet x1, 8 feet x1     Gait velocity: decreased   General Gait Details: Pt pulled O2 behind him. Generally unsteady but no  instances of LOB. Gait generally slow and excess M/L weight shifting  Stairs            Wheelchair Mobility    Modified Rankin (Stroke Patients Only)       Balance Overall balance assessment: Mild deficits observed, not formally tested                                           Pertinent Vitals/Pain Pain Assessment: 0-10 Pain Score: 2 (9/10 when coughing) Pain Location: Back Pain Descriptors / Indicators: Aching;Sore Pain Intervention(s): RN gave pain meds during session;Patient requesting pain meds-RN notified;Monitored during session    Home Living Family/patient expects to be discharged to:: Private residence Living Arrangements: Alone Available Help at Discharge: Available PRN/intermittently;Other (Comment)(Pt states that he has some people he can call if needed extra help and that he has a lady who he pays to clean around his home prn) Type of Home: House Home Access: Stairs to enter;Ramped entrance Entrance Stairs-Rails: Right;Left;Can reach both Entrance Stairs-Number of Steps: 3 Home Layout: Laundry or work area in [place]: Marine scientist - single point Additional Comments: Pt has O2 at home but takes it off to go downstairs to basement to get laundry    Prior Function Level of Independence: Independent         Comments: Pt is a limited community ambulator. He states that  at the grocery store, he needs to ride a "buggy" around because he gets SOB. Ind with ADL's, denies AD use or falls     Hand Dominance        Extremity/Trunk Assessment        Lower Extremity Assessment Lower Extremity Assessment: Generalized weakness       Communication   Communication: No difficulties  Cognition Arousal/Alertness: Awake/alert Behavior During Therapy: WFL for tasks assessed/performed Overall Cognitive Status: Within Functional Limits for tasks assessed                                        General  Comments      Exercises Total Joint Exercises Long Arc Quad: AROM;Strengthening;Both;10 reps Other Exercises Other Exercises: Cueing for deep breathing following ambulation   Assessment/Plan    PT Assessment Patient needs continued PT services  PT Problem List Decreased strength;Decreased mobility;Decreased activity tolerance;Cardiopulmonary status limiting activity;Decreased balance       PT Treatment Interventions Therapeutic exercise;Gait training;Balance training;Stair training;Functional mobility training;Therapeutic activities    PT Goals (Current goals can be found in the Care Plan section)  Acute Rehab PT Goals Patient Stated Goal: "Be more independent" PT Goal Formulation: With patient Time For Goal Achievement: 02/10/20 Potential to Achieve Goals: Good    Frequency Min 2X/week   Barriers to discharge Decreased caregiver support      Co-evaluation               AM-PAC PT "6 Clicks" Mobility  Outcome Measure Help needed turning from your back to your side while in a flat bed without using bedrails?: A Little Help needed moving from lying on your back to sitting on the side of a flat bed without using bedrails?: A Little Help needed moving to and from a bed to a chair (including a wheelchair)?: A Little Help needed standing up from a chair using your arms (e.g., wheelchair or bedside chair)?: A Little Help needed to walk in hospital room?: A Little Help needed climbing 3-5 steps with a railing? : A Little 6 Click Score: 18    End of Session Equipment Utilized During Treatment: Gait belt;Oxygen(2LO2/min) Activity Tolerance: Other (comment)(Pt limited by O2 desaturation) Patient left: in chair;with call bell/phone within reach Nurse Communication: Mobility status;Other (comment)(pt desaturated to 85% after ambulating 100') PT Visit Diagnosis: Unsteadiness on feet (R26.81);Muscle weakness (generalized) (M62.81)    Time: [id number] PT Time Calculation (min)  (ACUTE ONLY): 28 min   Charges:              Annabelle Harman, SPT 01/28/20 11:04 AM

## 2020-01-29 ENCOUNTER — Encounter: Payer: Self-pay | Admitting: Internal Medicine

## 2020-01-29 LAB — BASIC METABOLIC PANEL
Anion gap: 11 (ref 5–15)
BUN: 28 mg/dL — ABNORMAL HIGH (ref 8–23)
CO2: 29 mmol/L (ref 22–32)
Calcium: 9 mg/dL (ref 8.9–10.3)
Chloride: 88 mmol/L — ABNORMAL LOW (ref 98–111)
Creatinine, Ser: 0.77 mg/dL (ref 0.61–1.24)
GFR calc Af Amer: >60 mL/min (ref >60)
GFR calc non Af Amer: >60 mL/min (ref >60)
Glucose, Bld: 138 mg/dL — ABNORMAL HIGH (ref 70–99)
Potassium: 4.4 mmol/L (ref 3.5–5.1)
Sodium: 128 mmol/L — ABNORMAL LOW (ref 135–145)

## 2020-01-29 LAB — CBC
HCT: 37.8 % — ABNORMAL LOW (ref 39.0–52.0)
Hemoglobin: 12.8 g/dL — ABNORMAL LOW (ref 13.0–17.0)
MCH: 32.3 pg (ref 26.0–34.0)
MCHC: 33.9 g/dL (ref 30.0–36.0)
MCV: 95.5 fL (ref 80.0–100.0)
Platelets: 418 10*3/uL — ABNORMAL HIGH (ref 150–400)
RBC: 3.96 MIL/uL — ABNORMAL LOW (ref 4.22–5.81)
RDW: 13.8 % (ref 11.5–15.5)
WBC: 12 10*3/uL — ABNORMAL HIGH (ref 4.0–10.5)
nRBC: 0 % (ref 0.0–0.2)

## 2020-01-29 LAB — GLUCOSE, CAPILLARY
Glucose-Capillary: 128 mg/dL — ABNORMAL HIGH (ref 70–99)
Glucose-Capillary: 182 mg/dL — ABNORMAL HIGH (ref 70–99)

## 2020-01-29 MED ORDER — GUAIFENESIN-CODEINE 100-10 MG/5ML PO SOLN: 5.0000 mL | Freq: Four times a day (QID) | ORAL | 0 refills | Status: DC | PRN

## 2020-01-29 MED ORDER — ALBUTEROL SULFATE HFA 108 (90 BASE) MCG/ACT IN AERS: 2.0000 | INHALATION_SPRAY | Freq: Four times a day (QID) | RESPIRATORY_TRACT | 11 refills | Status: DC | PRN

## 2020-01-29 MED ORDER — PREDNISONE 10 MG PO TABS: ORAL_TABLET | ORAL | 0 refills | Status: DC

## 2020-01-29 MED ORDER — IPRATROPIUM-ALBUTEROL 0.5-2.5 (3) MG/3ML IN SOLN: 3.0000 mL | Freq: Four times a day (QID) | RESPIRATORY_TRACT | 11 refills | Status: DC | PRN

## 2020-01-29 MED ORDER — SULFAMETHOXAZOLE-TRIMETHOPRIM 800-160 MG PO TABS: 1.0000 | ORAL_TABLET | Freq: Two times a day (BID) | ORAL | 0 refills | Status: DC

## 2020-01-29 NOTE — TOC Initial Note (Addendum)
Transition of Care [facility]) - Initial/Assessment Note    Patient Details  Name: Austin Patterson MRN: [id number] Date of Birth: 08-31-1956  [facility] CM/SW Contact:    Candie Chroman, LCSW Phone Number: 01/29/2020, 10:53 AM  Clinical Narrative:  Readmission prevention screen started. CSW met with patient. No supports at bedside. CSW introduced role and explained that PT recommendations would be discussed. Patient agreeable to home health PT if he does not have any out-of-pocket costs. He was set up with [facility] in January but said after two visits they did not feel like he needed their services. Provided CMS scores for agencies that serve his zip code. First preference is [place]. Left message for [facility] representative. Patient on 2 L oxygen at home. PCP is Laneta Simmers, NP at [facility]. [facility] [place]. No issues obtaining medications. No further concerns. CSW encouraged patient to contact CSW as needed. CSW will continue to follow patient for support and facilitate return home when stable.     12:16 pm: [facility] unable to accept. [facility], and [facility] are checking patient's insurance. Left messages for [facility], Annamarie Dawley, and [employer].       12:31 pm: [facility] and [facility] are out-of-network with his plan. Nan[facility] Means is checking his insurance.         Expected Discharge Plan: [place] Barriers to Discharge: Continued Medical Work up   Patient Goals and CMS Choice   CMS Medicare.gov Compare Post Acute Care list provided to:: Patient    Expected Discharge Plan and Services Expected Discharge Plan: [place] Choice: [place] arrangements for the past 2 months: Single Family Home                                      Prior Living Arrangements/Services Living arrangements for the past 2 months: Single Family Home Lives with::  Self Patient language and need for interpreter reviewed:: Yes Do you feel safe going back to the place where you live?: Yes      Need for Family Participation in Patient Care: Yes (Comment) Care giver support system in place?: No (comment) Current home services: DME Criminal Activity/Legal Involvement Pertinent to Current Situation/Hospitalization: No - Comment as needed  Activities of Daily Living Home Assistive Devices/Equipment: Oxygen ADL Screening (condition at time of admission) Patient's cognitive ability adequate to safely complete daily activities?: Yes Is the patient deaf or have difficulty hearing?: No Does the patient have difficulty seeing, even when wearing glasses/contacts?: No Does the patient have difficulty concentrating, remembering, or making decisions?: No Patient able to express need for assistance with ADLs?: Yes Does the patient have difficulty dressing or bathing?: No Independently performs ADLs?: Yes (appropriate for developmental age) Does the patient have difficulty walking or climbing stairs?: Yes Weakness of Legs: Both Weakness of Arms/Hands: None  Permission Sought/Granted Permission sought to share information with : Facility Art therapist granted to share information with : Yes, Verbal Permission Granted     Permission granted to share info w AGENCY: Home Health Agencies        Emotional Assessment Appearance:: Appears stated age Attitude/Demeanor/Rapport: Engaged, Gracious Affect (typically observed): Accepting, Appropriate, Calm, Pleasant Orientation: : Oriented to Self, Oriented to Place, Oriented to  Time, Oriented to Situation Alcohol / Substance Use: Not Applicable Psych Involvement:  No (comment)  Admission diagnosis:  Epistaxis [R04.0] Hyponatremia [E87.1] RUQ abdominal pain [R10.11] COPD exacerbation ([place]) [J44.1] Patient Active Problem List   Diagnosis Date Noted  . Hyperlipidemia   . COPD exacerbation ([place])    . Bleeding nose   . Alcohol abuse   . RUQ abdominal pain   . Anaphylaxis 11/01/2019  . Hypotension 11/01/2019  . CAD (coronary artery disease) 11/01/2019  . H/O heart artery stent 11/01/2019  . Acute on chronic respiratory failure with hypoxia ([place])   . RUQ pain   . Essential hypertension   . IPF (idiopathic pulmonary fibrosis) ([place]) 08/30/2019  . Hypomagnesemia 03/31/2017  . Hiatal hernia   . Reflux esophagitis   . Hematemesis without nausea   . Hyponatremia 09/21/2016  . Multifocal pneumonia 12/09/2015   PCP:  Laneta Simmers, NP Pharmacy:   [facility] [place] - [address], [address], [place] [address] Phone: [phone] Fax: [phone]  [id number] [place]: [phone]: [address]Phone: [phone]: [phone] [id number] [address] [place]: [phone]: [phone]: [phone]: [phone]   Social Determinants of Health (SDOH) Interventions    Readmission Risk Interventions Readmission Risk Prevention Plan 01/29/2020 09/02/2019  Transportation Screening - Complete  PCP or Specialist Appt within 5-7 Days - Complete  PCP or Specialist Appt within 3-5 Days Complete -  Home Care Screening - Complete  Medication Review (RN CM) - Complete  HRI or Home Care Consult Complete -  Social Work Consult for Recovery Care Planning/Counseling Complete -  Palliative Care Screening Not Applicable -  Medication Review (RN Care Manager) Complete -  Some recent data might be hidden

## 2020-01-29 NOTE — Discharge Summary (Signed)
Physician Discharge Summary  Austin Patterson DOB: [birth year] DOA: 01/24/2020  PCP: Laneta Simmers, NP  Admit date: 01/24/2020 Discharge date: 01/29/2020  Admitted From: Home  Disposition:  Home with [facility]   Recommendations for Outpatient Follow-up:  1. Follow up with Dr. Patsey Berthold in 1 week 2. Dr. Patsey Berthold: Please refer to Palliative Care if appropriate and review CODE STATUS in light of patient's risk of intubation     Home Health: PT/OT due to shortness of breath with activity  Equipment/Devices: Walker  Discharge Condition: Declining  CODE STATUS: FULL Diet recommendation: Diabetic  Brief/Interim Summary: Austin Patterson is a 64 y.o. M with COPD on 2 L oxygen at home, pulmonary fibrosis, hypertension, hyperlipidemia, GERD, CAD, stent placement, alcohol abuse, who presents with shortness breath worse than baseline for a few days, not relieved with home inhalers without fever, chills.  In the ER, WBC normal, BNP normal, negative COVID-19 PCR.  Chest x-ray showed chronic fibrotic change and interstitial lung disease.  Patient very wheezy, admitted and started on steroids and nebulizers.       PRINCIPAL HOSPITAL DIAGNOSIS: Acute on chronic respiratory failure with hypoxia, multifactorial    Discharge Diagnoses:   Acute on chronic respiratory failure with hypoxia due to COPD exacerbation End stage Bullous emphysema Acute bronchitis with stenotrophomonas Patient admitted and started steroids and frequent bronchodilators.  He had slow clinical improvement.   Sputum culture obtained, grew Bactrim susceptible stenotrophomonas.  This was discussed by phone with Dr. Mortimer Fries, who recommended Bactrim DS twice daily for 10 days.  CT chest showed no pneumonia, showed advancing bullous disease.  Continue prednisone taper.  Close follow up with Pulmonology.     IPF (idiopathic pulmonary fibrosis)  Hypertension Continue metoprolol, losartan, HCTZ   CAD  (coronary artery disease) Continue aspirin, Plavix, lipitor, metoprolol, ARB   Hyponatremia Resolved. Likely from chronic respiratory distress, possibly alcohol use, dehydration.    RUQ pain LFTs, lipase normal.  Right upper quadrant ultrasound normal, pain resolved  Alcohol abuse No evidence of withdrawal             Discharge Instructions  Discharge Instructions    Diet - low sodium heart healthy   Complete by: As directed    Discharge instructions   Complete by: As directed    From Dr. Loleta Books: You were admitted for a flare of your allergic lung disease (asthma and ABPA).   You were also found to have bronchitis from stenotrophomonas.  You were treated with steroids, antibiotics and bronchodilators.  You should take prednisone 40 mg (4 tabs) for 3 days then  Take prednisone 30 mg (3 tabs) for 3 days then Take prednisone 20 mg (2 tabs) for 3 days  Then resume your home 10 mg daily and ask Dr. Patsey Berthold to refill   Use your home albuterol (either the inhaler or the Duo-neb nebulizer treatment) 4 times daily for the next week, then reduce to as needed use  Continue the antibiotic Bactrim DS twice daily for 7 more days  Call Dr. Patsey Berthold for a follow up appointment.   Increase activity slowly   Complete by: As directed      Allergies as of 01/29/2020      Reactions   Amoxicillin Anaphylaxis      Medication List    TAKE these medications   AeroChamber MV inhaler Use as instructed   albuterol 108 (90 Base) MCG/ACT inhaler Commonly known as: VENTOLIN HFA Inhale 2 puffs into the lungs every 6 (six) hours as  needed for wheezing or shortness of breath.   amLODipine 10 MG tablet Commonly known as: NORVASC Take 1 tablet (10 mg total) by mouth daily.   aspirin EC 81 MG tablet Take 1 tablet (81 mg total) by mouth daily.   atorvastatin 40 MG tablet Commonly known as: LIPITOR Take 1 tablet (40 mg total) by mouth at bedtime.   calcium-vitamin D 500-200  MG-UNIT tablet Commonly known as: OSCAL WITH D Take 2 tablets by mouth daily with breakfast. What changed: when to take this   cetirizine 10 MG tablet Commonly known as: ZYRTEC Take 10 mg by mouth at bedtime.   clopidogrel 75 MG tablet Commonly known as: PLAVIX Take 1 tablet (75 mg total) by mouth daily.   Dupixent 300 MG/2ML Sopn Generic drug: Dupilumab Inject 2 mLs into the skin every 14 (fourteen) days.   EPINEPHrine 0.3 mg/0.3 mL Soaj injection Commonly known as: EPI-PEN Inject 0.3 mg into the muscle as needed for anaphylaxis.   Fluticasone-Salmeterol 250-50 MCG/DOSE Aepb Commonly known as: Advair Diskus Inhale 1 puff into the lungs 2 (two) times daily.   ipratropium-albuterol 0.5-2.5 (3) MG/3ML Soln Commonly known as: DUONEB Take 3 mLs by nebulization 4 (four) times daily as needed (wheezing/shortness of breath).   losartan-hydrochlorothiazide 100-12.5 MG tablet Commonly known as: HYZAAR Take 1 tablet by mouth daily.   magnesium oxide 400 (241.3 Mg) MG tablet Commonly known as: MAG-OX Take 1 tablet (400 mg total) by mouth daily. What changed: when to take this   metoprolol succinate 50 MG 24 hr tablet Commonly known as: TOPROL-XL Take 1 tablet (50 mg total) by mouth daily.   multivitamin with minerals Tabs tablet Take 1 tablet by mouth daily. What changed: when to take this   pantoprazole 40 MG tablet Commonly known as: PROTONIX Take 1 tablet (40 mg total) by mouth 2 (two) times daily.   Potassium Chloride ER 20 MEQ Tbcr Take 20 mEq by mouth daily. What changed: when to take this   predniSONE 10 MG tablet Commonly known as: DELTASONE Take prednisone 40 mg (4 tabs) for 3 days then take prednisone 30 mg (3 tabs) for 3 days then take prednisone 20 mg (2 tabs) for 3 days then resume your home 10 mg daily and ask Dr. Patsey Berthold to refill What changed:   how much to take  how to take this  when to take this  additional instructions    sulfamethoxazole-trimethoprim 800-160 MG tablet Commonly known as: BACTRIM DS Take 1 tablet by mouth 2 (two) times daily.      Follow-up Information    Laneta Simmers, NP. Schedule an appointment as soon as possible for a visit in 1 week(s).   Specialty: Nurse Practitioner Contact information: [place] [address] [phone]        Tyler Pita, MD. Schedule an appointment as soon as possible for a visit in 1 week(s).   Specialty: Pulmonary Disease Contact information: [place] [address] [phone]          Allergies  Allergen Reactions  . Amoxicillin Anaphylaxis    Consultations:     Procedures/Studies: CT CHEST WO CONTRAST  Result Date: 01/28/2020 CLINICAL DATA:  Respiratory distress. EXAM: CT CHEST WITHOUT CONTRAST TECHNIQUE: Multidetector CT imaging of the chest was performed following the standard protocol without IV contrast. COMPARISON:  January 29, 2016 FINDINGS: Cardiovascular: There is marked severity calcification of the thoracic aorta. Normal heart size. No pericardial effusion. Marked severity  coronary artery calcification is seen. Mediastinum/Nodes: There is mild pretracheal lymphadenopathy. Lungs/Pleura: There is marked severity emphysematous lung disease with extensive bullous changes seen along lateral aspect of the right upper lobe. There is no evidence of acute infiltrate, pleural effusion or pneumothorax. Upper Abdomen: A small hiatal hernia is seen. There is a stable 1.8 cm x 1.7 cm low-attenuation right adrenal mass. An additional stable 1.8 cm x 1.3 cm low-attenuation left adrenal mass is noted. Musculoskeletal: A chronic appearing deformity is seen along the anterior aspect of the fourth left rib. Degenerative changes seen throughout the thoracic spine. IMPRESSION: 1. Marked severity emphysematous lung disease with extensive bullous changes seen along the lateral aspect of the right upper lobe.  2. Stable bilateral low-attenuation adrenal masses which likely represent adrenal adenomas. 3. Small hiatal hernia. Aortic Atherosclerosis (ICD10-I70.0) and Emphysema (ICD10-J43.9). Electronically Signed   By: Virgina Norfolk M.D.   On: 01/28/2020 20:40   DG Chest Port 1 View  Result Date: 01/24/2020 CLINICAL DATA:  Dyspnea EXAM: PORTABLE CHEST 1 VIEW COMPARISON:  11/01/2019 FINDINGS: Chronic lung disease with emphysema and fibrotic appearance. Increased markings at the bases which could be bronchitis or just poor inspiration. No edema, effusion, or pneumothorax. Normal heart size and stable mediastinal contours. IMPRESSION: Chronic lung disease with fibrosis by remote chest CT. Interstitial coarsening is more prominent than on prior and this could be from lower lung volumes or acute interstitial disease. Electronically Signed   By: Monte Fantasia M.D.   On: 01/24/2020 09:32   US ABDOMEN LIMITED RUQ  Result Date: 01/24/2020 CLINICAL DATA:  Right upper quadrant abdominal pain EXAM: ULTRASOUND ABDOMEN LIMITED RIGHT UPPER QUADRANT COMPARISON:  None. FINDINGS: Gallbladder: No gallstones or wall thickening visualized. No sonographic Murphy sign noted by sonographer. Common bile duct: Diameter: Liver: Increased echotexture seen throughout. No focal abnormality or biliary ductal dilatation. Portal vein is patent on color Doppler imaging with normal direction of blood flow towards the liver. Other: None. IMPRESSION: Normal gallbladder. Hepatic steatosis Electronically Signed   By: Prudencio Pair M.D.   On: 01/24/2020 13:30      Subjective: Feeling somewhat better.  Still dyspneic with exertion.  No fever, chills, no sputum.  Discharge Exam: Vitals:   01/29/20 0933 01/29/20 0934  BP: 133/79 133/79  Pulse: 94 94  Resp: 18 18  Temp:    SpO2: 92% 93%   Vitals:   01/28/20 2031 01/29/20 0502 01/29/20 0933 01/29/20 0934  BP: (!) 160/88 (!) 148/84 133/79 133/79  Pulse: 83 83 94 94  Resp: 20 20 18 18    Temp: 97.6 F (36.4 C) 97.9 F (36.6 C)    TempSrc: Oral Oral    SpO2: 97% 95% 92% 93%  Weight:      Height:        General: Pt is alert, awake, not in acute distress Cardiovascular: RRR, nl S1-S2, no murmurs appreciated.   No LE edema.   Respiratory: Appears moderately dysnpeic at rest.  Wheezing bilaterally, but imiproved from previous.    Abdominal: Abdomen soft and non-tender.  No distension or HSM.   Neuro/Psych: Strength symmetric in upper and lower extremities.  Judgment and insight appear normal.   The results of significant diagnostics from this hospitalization (including imaging, microbiology, ancillary and laboratory) are listed below for reference.     Microbiology: Recent Results (from the past 240 hour(s))  Respiratory Panel by RT PCR (Flu A&B, Covid) - Nasopharyngeal Swab     Status: None   Collection Time: 01/24/20  9:10 AM   Specimen: Nasopharyngeal Swab  Result Value Ref Range Status   SARS Coronavirus 2 by RT PCR NEGATIVE NEGATIVE Final    Comment: (NOTE) SARS-CoV-2 target nucleic acids are NOT DETECTED. The SARS-CoV-2 RNA is generally detectable in upper respiratoy specimens during the acute phase of infection. The lowest concentration of SARS-CoV-2 viral copies this assay can detect is 131 copies/mL. A negative result does not preclude SARS-Cov-2 infection and should not be used as the sole basis for treatment or other patient management decisions. A negative result may occur with  improper specimen collection/handling, submission of specimen other than nasopharyngeal swab, presence of viral mutation(s) within the areas targeted by this assay, and inadequate number of viral copies (<131 copies/mL). A negative result must be combined with clinical observations, patient history, and epidemiological information. The expected result is Negative. Fact Sheet for Patients:  PinkCheek.be Fact Sheet for Healthcare Providers:   GravelBags.it This test is not yet ap proved or cleared by the [place] FDA and  has been authorized for detection and/or diagnosis of SARS-CoV-2 by FDA under an Emergency Use Authorization (EUA). This EUA will remain  in effect (meaning this test can be used) for the duration of the COVID-19 declaration under Section 564(b)(1) of the Act, 21 U.S.C. section 360bbb-3(b)(1), unless the authorization is terminated or revoked sooner.    Influenza A by PCR NEGATIVE NEGATIVE Final   Influenza B by PCR NEGATIVE NEGATIVE Final    Comment: (NOTE) The Xpert Xpress SARS-CoV-2/FLU/RSV assay is intended as an aid in  the diagnosis of influenza from Nasopharyngeal swab specimens and  should not be used as a sole basis for treatment. Nasal washings and  aspirates are unacceptable for Xpert Xpress SARS-CoV-2/FLU/RSV  testing. Fact Sheet for Patients: PinkCheek.be Fact Sheet for Healthcare Providers: GravelBags.it This test is not yet approved or cleared by the [place] FDA and  has been authorized for detection and/or diagnosis of SARS-CoV-2 by  FDA under an Emergency Use Authorization (EUA). This EUA will remain  in effect (meaning this test can be used) for the duration of the  Covid-19 declaration under Section 564(b)(1) of the Act, 21  U.S.C. section 360bbb-3(b)(1), unless the authorization is  terminated or revoked. Performed at [facility], [address] [place], [place] [address]   Culture, sputum-assessment     Status: None   Collection Time: 01/24/20  8:06 PM   Specimen: Expectorated Sputum  Result Value Ref Range Status   Specimen Description EXPECTORATED SPUTUM  Final   Special Requests NONE  Final   Sputum evaluation   Final    THIS SPECIMEN IS ACCEPTABLE FOR SPUTUM CULTURE Performed at [facility], [address], [place], [place] [address]    Report Status  01/24/2020 FINAL  Final  Culture, respiratory     Status: None   Collection Time: 01/24/20  8:06 PM  Result Value Ref Range Status   Specimen Description   Final    EXPECTORATED SPUTUM Performed at [facility], [address] [place], [place] [address]    Special Requests   Final    NONE Reflexed from [id number] Performed at [facility], [address] [place], [place] [address]    Gram Stain   Final    FEW WBC PRESENT, PREDOMINANTLY MONONUCLEAR ABUNDANT GRAM POSITIVE COCCI ABUNDANT GRAM NEGATIVE RODS FEW GRAM VARIABLE ROD FEW SQUAMOUS EPITHELIAL CELLS PRESENT    Culture   Final    FEW STENOTROPHOMONAS MALTOPHILIA WITH IN  NORMAL RESPIRATORY FLORA Performed at [facility], [place] [address] [place], [place] [address]    Report Status 01/27/2020 FINAL  Final   Organism ID, Bacteria STENOTROPHOMONAS MALTOPHILIA  Final      Susceptibility   Stenotrophomonas maltophilia - MIC*    LEVOFLOXACIN 0.5 SENSITIVE Sensitive     TRIMETH/SULFA <=20 SENSITIVE Sensitive     * FEW STENOTROPHOMONAS MALTOPHILIA     Labs: BNP (last 3 results) Recent Labs    08/30/19 0910 01/24/20 0909  BNP 26.0 [id number]   Basic Metabolic Panel: Recent Labs  Lab 01/25/20 0003 01/25/20 0720 01/26/20 0600 01/28/20 0627 01/29/20 0440  NA 128* 132* 134* 131* 128*  K 4.6 4.3 4.5 4.7 4.4  CL 89* 93* 93* 91* 88*  CO2 27 28 29 29 29   GLUCOSE 204* 191* 156* 132* 138*  BUN 18 13 19 22  28*  CREATININE 0.79 0.60* 0.69 0.68 0.77  CALCIUM 8.9 9.1 9.3 9.3 9.0   Liver Function Tests: Recent Labs  Lab 01/24/20 0909 01/28/20 0627  AST 40 45*  ALT 35 51*  ALKPHOS 67 60  BILITOT 0.9 0.6  PROT 7.4 6.9  ALBUMIN 4.1 3.8   Recent Labs  Lab 01/24/20 0909  LIPASE 26   No results for input(s): AMMONIA in the last 168 hours. CBC: Recent Labs  Lab 01/24/20 0909 01/26/20 0600 01/28/20 0627 01/29/20 0440  WBC 10.0 12.4* 13.9* 12.0*  NEUTROABS 7.9*  --   --   --   HGB 12.4* 12.1*  12.7* 12.8*  HCT 36.6* 36.6* 38.0* 37.8*  MCV 95.6 99.5 96.7 95.5  PLT 363 428* 419* 418*   Cardiac Enzymes: No results for input(s): CKTOTAL, CKMB, CKMBINDEX, TROPONINI in the last 168 hours. BNP: Invalid input(s): POCBNP CBG: Recent Labs  Lab 01/28/20 2142 01/29/20 0745 01/29/20 1142  GLUCAP 141* 128* 182*   D-Dimer No results for input(s): DDIMER in the last 72 hours. Hgb A1c No results for input(s): HGBA1C in the last 72 hours. Lipid Profile No results for input(s): CHOL, HDL, LDLCALC, TRIG, CHOLHDL, LDLDIRECT in the last 72 hours. Thyroid function studies No results for input(s): TSH, T4TOTAL, T3FREE, THYROIDAB in the last 72 hours.  Invalid input(s): FREET3 Anemia work up No results for input(s): VITAMINB12, FOLATE, FERRITIN, TIBC, IRON, RETICCTPCT in the last 72 hours. Urinalysis    Component Value Date/Time   COLORURINE YELLOW (A) 12/24/2017 1750   APPEARANCEUR HAZY (A) 12/24/2017 1750   LABSPEC 1.017 12/24/2017 1750   PHURINE 5.0 12/24/2017 1750   GLUCOSEU NEGATIVE 12/24/2017 1750   HGBUR NEGATIVE 12/24/2017 1750   BILIRUBINUR NEGATIVE 12/24/2017 1750   KETONESUR NEGATIVE 12/24/2017 1750   PROTEINUR 30 (A) 12/24/2017 1750   NITRITE NEGATIVE 12/24/2017 1750   LEUKOCYTESUR NEGATIVE 12/24/2017 1750   Sepsis Labs Invalid input(s): PROCALCITONIN,  WBC,  LACTICIDVEN Microbiology Recent Results (from the past 240 hour(s))  Respiratory Panel by RT PCR (Flu A&B, Covid) - Nasopharyngeal Swab     Status: None   Collection Time: 01/24/20  9:10 AM   Specimen: Nasopharyngeal Swab  Result Value Ref Range Status   SARS Coronavirus 2 by RT PCR NEGATIVE NEGATIVE Final    Comment: (NOTE) SARS-CoV-2 target nucleic acids are NOT DETECTED. The SARS-CoV-2 RNA is generally detectable in upper respiratoy specimens during the acute phase of infection. The lowest concentration of SARS-CoV-2 viral copies this assay can detect is 131 copies/mL. A negative result does not  preclude SARS-Cov-2 infection and should not be used as the  sole basis for treatment or other patient management decisions. A negative result may occur with  improper specimen collection/handling, submission of specimen other than nasopharyngeal swab, presence of viral mutation(s) within the areas targeted by this assay, and inadequate number of viral copies (<131 copies/mL). A negative result must be combined with clinical observations, patient history, and epidemiological information. The expected result is Negative. Fact Sheet for Patients:  PinkCheek.be Fact Sheet for Healthcare Providers:  GravelBags.it This test is not yet ap proved or cleared by the [place] FDA and  has been authorized for detection and/or diagnosis of SARS-CoV-2 by FDA under an Emergency Use Authorization (EUA). This EUA will remain  in effect (meaning this test can be used) for the duration of the COVID-19 declaration under Section 564(b)(1) of the Act, 21 U.S.C. section 360bbb-3(b)(1), unless the authorization is terminated or revoked sooner.    Influenza A by PCR NEGATIVE NEGATIVE Final   Influenza B by PCR NEGATIVE NEGATIVE Final    Comment: (NOTE) The Xpert Xpress SARS-CoV-2/FLU/RSV assay is intended as an aid in  the diagnosis of influenza from Nasopharyngeal swab specimens and  should not be used as a sole basis for treatment. Nasal washings and  aspirates are unacceptable for Xpert Xpress SARS-CoV-2/FLU/RSV  testing. Fact Sheet for Patients: PinkCheek.be Fact Sheet for Healthcare Providers: GravelBags.it This test is not yet approved or cleared by the [place] FDA and  has been authorized for detection and/or diagnosis of SARS-CoV-2 by  FDA under an Emergency Use Authorization (EUA). This EUA will remain  in effect (meaning this test can be used) for the duration of the   Covid-19 declaration under Section 564(b)(1) of the Act, 21  U.S.C. section 360bbb-3(b)(1), unless the authorization is  terminated or revoked. Performed at [facility], [address] [place], [place] [address]   Culture, sputum-assessment     Status: None   Collection Time: 01/24/20  8:06 PM   Specimen: Expectorated Sputum  Result Value Ref Range Status   Specimen Description EXPECTORATED SPUTUM  Final   Special Requests NONE  Final   Sputum evaluation   Final    THIS SPECIMEN IS ACCEPTABLE FOR SPUTUM CULTURE Performed at [facility], [address], [place], [place] [address]    Report Status 01/24/2020 FINAL  Final  Culture, respiratory     Status: None   Collection Time: 01/24/20  8:06 PM  Result Value Ref Range Status   Specimen Description   Final    EXPECTORATED SPUTUM Performed at [facility], [address], [place], [place] [address]    Special Requests   Final    NONE Reflexed from [id number] Performed at [facility], [address] [place], [place] [address]    Gram Stain   Final    FEW WBC PRESENT, PREDOMINANTLY MONONUCLEAR ABUNDANT GRAM POSITIVE COCCI ABUNDANT GRAM NEGATIVE RODS FEW GRAM VARIABLE ROD FEW SQUAMOUS EPITHELIAL CELLS PRESENT    Culture   Final    FEW STENOTROPHOMONAS MALTOPHILIA WITH IN NORMAL RESPIRATORY FLORA Performed at [facility], [place] [address], [place], [place] [address]    Report Status 01/27/2020 FINAL  Final   Organism ID, Bacteria STENOTROPHOMONAS MALTOPHILIA  Final      Susceptibility   Stenotrophomonas maltophilia - MIC*    LEVOFLOXACIN 0.5 SENSITIVE Sensitive     TRIMETH/SULFA <=20 SENSITIVE Sensitive     * FEW STENOTROPHOMONAS MALTOPHILIA     Time coordinating discharge: 35 minutes The [place] controlled substances  registry was reviewed for this patient      SIGNED:   Edwin Dada, MD  [facility] 01/29/2020, 12:00 PM

## 2020-01-29 NOTE — Discharge Instructions (Signed)

## 2020-01-29 NOTE — Care Management Important Message (Signed)
Important Message  Patient Details  Name: Austin Patterson MRN: [id number] Date of Birth: 1956-08-26   Medicare Important Message Given:  Yes     Dannette Barbara 01/29/2020, 11:02 AM

## 2020-01-29 NOTE — TOC Transition Note (Signed)
Transition of Care [facility]) - CM/SW Discharge Note   Patient Details  Name: MIO HOLAN MRN: [id number] Date of Birth: 12/11/1955  Transition of Care [facility]) CM/SW Contact:  Candie Chroman, LCSW Phone Number: 01/29/2020, 1:43 PM   Clinical Narrative: Patient has orders to discharge home today. [facility] is not in network with his plan, [facility] unable to accept due to staffing. [facility] is able to accept patient and he will not have a copay. Patient is aware and agreeable. Sent secure chat to MD requesting home health PT and RN orders. No further concerns. CSW signing off.    Final next level of care: Home w Home Health Services Barriers to Discharge: Barriers Resolved   Patient Goals and CMS Choice   CMS Medicare.gov Compare Post Acute Care list provided to:: Patient Choice offered to / list presented to : Patient  Discharge Placement                Patient to be transferred to facility by: [facility] will take him home.   Patient and family notified of of transfer: 01/29/20  Discharge Plan and Services     Post Acute Care Choice: [place] Arranged: RN, PT [facility] Agency: [facility] Date [place]: 01/29/20   Representative spoke with at [place]: [place] Interventions     Readmission Risk Interventions Readmission Risk Prevention Plan 01/29/2020 09/02/2019  Transportation Screening - Complete  PCP or Specialist Appt within 5-7 Days - Complete  PCP or Specialist Appt within 3-5 Days Complete -  Home Care Screening - Complete  Medication Review (RN CM) - Complete  HRI or Home Care Consult Complete -  Social Work Consult for [place] Planning/Counseling Complete -  Palliative Care Screening Not Applicable -  Medication Review Press photographer) Complete -  Some recent data might be hidden

## 2020-01-29 NOTE — Progress Notes (Signed)
Patient discharged to home with self care.  Reviewed medication and discharge instructions with patient who verbalized understanding.  Home oxygen brought for discharge and transport home

## 2020-01-30 ENCOUNTER — Telehealth: Payer: Self-pay | Admitting: Pulmonary Disease

## 2020-01-30 NOTE — Telephone Encounter (Signed)
Spoke with pt. He has been scheduled on 02/05/2020 at 1000. Nothing further is needed.

## 2020-01-30 NOTE — Telephone Encounter (Signed)
LMTCB x1 for pt.  

## 2020-02-05 ENCOUNTER — Ambulatory Visit (INDEPENDENT_AMBULATORY_CARE_PROVIDER_SITE_OTHER): Payer: Medicare HMO

## 2020-02-05 ENCOUNTER — Other Ambulatory Visit: Payer: Self-pay

## 2020-02-05 DIAGNOSIS — J4550 Severe persistent asthma, uncomplicated: Secondary | ICD-10-CM | POA: Diagnosis not present

## 2020-02-05 MED ORDER — DUPILUMAB 300 MG/2ML ~~LOC~~ SOSY: 300.0000 mg | PREFILLED_SYRINGE | Freq: Once | SUBCUTANEOUS | Status: DC

## 2020-02-05 MED ORDER — DUPILUMAB 300 MG/2ML ~~LOC~~ SOSY
600.0000 mg | PREFILLED_SYRINGE | Freq: Once | SUBCUTANEOUS | Status: AC
  Administered 2020-02-05: 12:00:00 600 mg via SUBCUTANEOUS

## 2020-02-05 NOTE — Progress Notes (Signed)
Patient presented to the office today for first-time Dupixent injection.  Primary Pulmonologist: Patsey Berthold Medication name: Dupixent Strength: 674ml/4ml Site(s):300mg /67ml left abdomen and 300mg /75ml right abdomen  Epi pen/Auvi-Q visible during appointment: Yes  Time of injection: 1000  Patient evaluated every 15-20 minutes per protocol x2 hours.  1st check: 1015 Evaluation:Denies any side or adverse effect.  No redness or swelling a injection at right or left abdomen injection sites.  2nd check: 1030  Evaluation: Patient sitting chair.   Patient denies any side or adverse effects at this time.  3rd check: 1045  Evaluation: Patient sitting in chair. Patient denies any side or adverse effects.  No redness or swelling at injection sites.  4th check: 1100   Evaluation: Patient denies any side or adverse effects.  No redness or swelling at right or left abdominal sites.  5th check: 1115  Evaluation: Patient sitting quietly.  Patient denies any side or adverse effects.  No redness or swelling at injection sites.  6th check: 1130  Evaluation:Patient denies any side or adverse effects.    7th check: 1145 Evaluation: Patient denies any side or adverse effects. No redness or swelling at injection sites.  8th check: 1200 Evaluation:Patient denies any side or adverse effects.  No swelling or redness at right or left abdominal sites. Reviewed Epipen use and when to call for emergency assistance. Patient scheduled second injection and instructed to bring Epipen to appointment.

## 2020-02-09 ENCOUNTER — Emergency Department: Payer: Medicare HMO

## 2020-02-09 ENCOUNTER — Other Ambulatory Visit: Payer: Self-pay

## 2020-02-09 ENCOUNTER — Emergency Department
Admission: EM | Admit: 2020-02-09 | Discharge: 2020-02-09 | Disposition: A | Discharge status: Home / Self Care | Payer: Medicare HMO | Attending: Emergency Medicine | Admitting: Emergency Medicine

## 2020-02-09 ENCOUNTER — Encounter: Payer: Self-pay | Admitting: *Deleted

## 2020-02-09 DIAGNOSIS — J449 Chronic obstructive pulmonary disease, unspecified: Secondary | ICD-10-CM | POA: Diagnosis not present

## 2020-02-09 DIAGNOSIS — Z79899 Other long term (current) drug therapy: Secondary | ICD-10-CM | POA: Diagnosis not present

## 2020-02-09 DIAGNOSIS — Z87891 Personal history of nicotine dependence: Secondary | ICD-10-CM | POA: Diagnosis not present

## 2020-02-09 DIAGNOSIS — Z7982 Long term (current) use of aspirin: Secondary | ICD-10-CM | POA: Diagnosis not present

## 2020-02-09 DIAGNOSIS — I10 Essential (primary) hypertension: Secondary | ICD-10-CM | POA: Insufficient documentation

## 2020-02-09 DIAGNOSIS — I2510 Atherosclerotic heart disease of native coronary artery without angina pectoris: Secondary | ICD-10-CM | POA: Diagnosis not present

## 2020-02-09 DIAGNOSIS — M546 Pain in thoracic spine: Secondary | ICD-10-CM | POA: Insufficient documentation

## 2020-02-09 LAB — CBC
HCT: 34.1 % — ABNORMAL LOW (ref 39.0–52.0)
Hemoglobin: 11.7 g/dL — ABNORMAL LOW (ref 13.0–17.0)
MCH: 33.1 pg (ref 26.0–34.0)
MCHC: 34.3 g/dL (ref 30.0–36.0)
MCV: 96.6 fL (ref 80.0–100.0)
Platelets: 337 10*3/uL (ref 150–400)
RBC: 3.53 MIL/uL — ABNORMAL LOW (ref 4.22–5.81)
RDW: 13.9 % (ref 11.5–15.5)
WBC: 11.1 10*3/uL — ABNORMAL HIGH (ref 4.0–10.5)
nRBC: 0 % (ref 0.0–0.2)

## 2020-02-09 LAB — BASIC METABOLIC PANEL
Anion gap: 10 (ref 5–15)
BUN: 13 mg/dL (ref 8–23)
CO2: 29 mmol/L (ref 22–32)
Calcium: 8.8 mg/dL — ABNORMAL LOW (ref 8.9–10.3)
Chloride: 89 mmol/L — ABNORMAL LOW (ref 98–111)
Creatinine, Ser: 0.68 mg/dL (ref 0.61–1.24)
GFR calc Af Amer: >60 mL/min (ref >60)
GFR calc non Af Amer: >60 mL/min (ref >60)
Glucose, Bld: 105 mg/dL — ABNORMAL HIGH (ref 70–99)
Potassium: 3.9 mmol/L (ref 3.5–5.1)
Sodium: 128 mmol/L — ABNORMAL LOW (ref 135–145)

## 2020-02-09 LAB — URINALYSIS, COMPLETE (UACMP) WITH MICROSCOPIC
Bacteria, UA: NONE SEEN
Bilirubin Urine: NEGATIVE
Glucose, UA: NEGATIVE mg/dL
Hgb urine dipstick: NEGATIVE
Ketones, ur: NEGATIVE mg/dL
Leukocytes,Ua: NEGATIVE
Nitrite: NEGATIVE
Protein, ur: NEGATIVE mg/dL
Specific Gravity, Urine: 1.011 (ref 1.005–1.030)
Squamous Epithelial / HPF: NONE SEEN (ref 0–5)
pH: 5 (ref 5.0–8.0)

## 2020-02-09 LAB — TROPONIN I (HIGH SENSITIVITY)
Troponin I (High Sensitivity): 6 ng/L (ref <18)
Troponin I (High Sensitivity): 8 ng/L (ref <18)

## 2020-02-09 LAB — ETHANOL: Alcohol, Ethyl (B): 68 mg/dL — ABNORMAL HIGH (ref <10)

## 2020-02-09 MED ORDER — IOHEXOL 350 MG/ML SOLN
100.0000 mL | Freq: Once | INTRAVENOUS | Status: AC | PRN
  Administered 2020-02-09: 100 mL via INTRAVENOUS

## 2020-02-09 MED ORDER — OXYCODONE-ACETAMINOPHEN 5-325 MG PO TABS
1.0000 | ORAL_TABLET | Freq: Once | ORAL | Status: AC
  Administered 2020-02-09: 1 via ORAL
  Filled 2020-02-09: qty 1

## 2020-02-09 MED ORDER — OXYCODONE-ACETAMINOPHEN 5-325 MG PO TABS: 1.0000 | ORAL_TABLET | ORAL | 0 refills | Status: DC | PRN

## 2020-02-09 MED ORDER — SODIUM CHLORIDE 0.9 % IV BOLUS
1000.0000 mL | Freq: Once | INTRAVENOUS | Status: AC
  Administered 2020-02-09: 1000 mL via INTRAVENOUS

## 2020-02-09 MED ORDER — MORPHINE SULFATE (PF) 4 MG/ML IV SOLN
4.0000 mg | Freq: Once | INTRAVENOUS | Status: AC
  Administered 2020-02-09: 4 mg via INTRAVENOUS
  Filled 2020-02-09: qty 1

## 2020-02-09 NOTE — Discharge Instructions (Addendum)
You may take Percocet as needed for pain.  Apply moist heat to affected area several times daily.  Return to the ER for worsening symptoms, persistent vomiting, difficulty breathing or other concerns. 

## 2020-02-09 NOTE — ED Notes (Signed)
Waiting on ride. Remains in room on O2 at this time

## 2020-02-09 NOTE — ED Triage Notes (Addendum)
Pt to ED reporting back pain that has been persistent for a "while" Pt reports he hasn't felt like he recovered from the last time he was in the hospital for Asthma and COPD. PT reports the last time he felt well was "when I was on Morphine."   Pt is on 2L at all times and does not appear more SOB but is currently unable to sit still in the wheelchair.   Pt admits to drinking tonight.

## 2020-02-09 NOTE — ED Notes (Signed)
Pt transported to CT at this time.

## 2020-02-09 NOTE — ED Provider Notes (Signed)
[facility] Emergency Department Provider Note   ____________________________________________   First MD Initiated Contact with Patient 02/09/20 [phone]     (approximate)  I have reviewed the triage vital signs and the nursing notes.   HISTORY  Chief Complaint Back Pain    HPI Austin Patterson is a 64 y.o. male who presents to the ED from home with a chief complaint of nontraumatic back pain.  Patient reports bilateral ribs and thoracic back pain for 2 to 3 weeks.  States he had it when he was hospitalized recently.  He was hospitalized for COPD exacerbation.  Had sputum cultures which grew out Stenotrophomonas he was treated with Bactrim DS twice daily for 10 days.  CT chest at that time demonstrated no pneumonia but advancing bullous disease.  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  Also has a history of alcohol abuse.  Denies history of DTs.  Denies fever, cough, shortness of breath, abdominal pain, nausea or vomiting.       Past Medical History:  Diagnosis Date  . COPD (chronic obstructive pulmonary disease) ([place])   . Hyperlipidemia   . Hypertension   . Pulmonary fibrosis ([place]) 11/2015    Patient Active Problem List   Diagnosis Date Noted  . Hyperlipidemia   . COPD exacerbation ([place])   . Bleeding nose   . Alcohol abuse   . RUQ abdominal pain   . Anaphylaxis 11/01/2019  . Hypotension 11/01/2019  . CAD (coronary artery disease) 11/01/2019  . H/O heart artery stent 11/01/2019  . Acute on chronic respiratory failure with hypoxia ([place])   . RUQ pain   . Essential hypertension   . IPF (idiopathic pulmonary fibrosis) ([place]) 08/30/2019  . Hypomagnesemia 03/31/2017  . Hiatal hernia   . Reflux esophagitis   . Hematemesis without nausea   . Hyponatremia 09/21/2016  . Multifocal pneumonia 12/09/2015    Past Surgical History:  Procedure Laterality Date  . CORONARY STENT PLACEMENT    . ESOPHAGOGASTRODUODENOSCOPY (EGD)  WITH PROPOFOL N/A 09/23/2016   Procedure: ESOPHAGOGASTRODUODENOSCOPY (EGD) WITH PROPOFOL;  Surgeon: [name], MD;  Location: [facility] ENDOSCOPY;  Service: Endoscopy;  Laterality: N/A;  . SHOULDER ACROMIOPLASTY      Prior to Admission medications   Medication Sig Start Date End Date Taking? Authorizing Provider  albuterol (VENTOLIN HFA) 108 (90 Base) MCG/ACT inhaler Inhale 2 puffs into the lungs every 6 (six) hours as needed for wheezing or shortness of breath. 01/29/20   Danford, Suann Larry, MD  amLODipine (NORVASC) 10 MG tablet Take 1 tablet (10 mg total) by mouth daily. 08/19/16   Tawni Millers, MD  aspirin EC 81 MG tablet Take 1 tablet (81 mg total) by mouth daily. 08/19/16   Tawni Millers, MD  atorvastatin (LIPITOR) 40 MG tablet Take 1 tablet (40 mg total) by mouth at bedtime. 08/19/16   Tawni Millers, MD  calcium-vitamin D (OSCAL WITH D) 500-200 MG-UNIT tablet Take 2 tablets by mouth daily with breakfast. Patient taking differently: Take 2 tablets by mouth daily in the afternoon.  09/24/16   Fritzi Mandes, MD  cetirizine (ZYRTEC) 10 MG tablet Take 10 mg by mouth at bedtime. 08/19/16   [provider]  clopidogrel (PLAVIX) 75 MG tablet Take 1 tablet (75 mg total) by mouth daily. 08/19/16   Tawni Millers, MD  Dupilumab (DUPIXENT) 300 MG/2ML SOPN Inject 2 mLs into the skin every 14 (fourteen) days. 12/05/19   Tyler Pita,  MD  EPINEPHrine 0.3 mg/0.3 mL IJ SOAJ injection Inject 0.3 mg into the muscle as needed for anaphylaxis.     [provider]  Fluticasone-Salmeterol (ADVAIR DISKUS) 250-50 MCG/DOSE AEPB Inhale 1 puff into the lungs 2 (two) times daily. 10/31/19 10/30/20  Tyler Pita, MD  guaiFENesin-codeine 100-10 MG/5ML syrup Take 5 mLs by mouth every 6 (six) hours as needed for cough. 01/29/20   Danford, Suann Larry, MD  ipratropium-albuterol (DUONEB) 0.5-2.5 (3) MG/3ML SOLN Take 3 mLs by nebulization 4 (four) times daily as needed (wheezing/shortness of breath).  01/29/20   Danford, Suann Larry, MD  losartan-hydrochlorothiazide (HYZAAR) 100-12.5 MG tablet Take 1 tablet by mouth daily. 11/05/19   [provider]  magnesium oxide (MAG-OX) 400 (241.3 Mg) MG tablet Take 1 tablet (400 mg total) by mouth daily. Patient taking differently: Take 400 mg by mouth daily in the afternoon.  06/18/17   Hillary Bow, MD  metoprolol succinate (TOPROL-XL) 50 MG 24 hr tablet Take 1 tablet (50 mg total) by mouth daily. 08/19/16   Tawni Millers, MD  Multiple Vitamin (MULTIVITAMIN WITH MINERALS) TABS tablet Take 1 tablet by mouth daily. Patient taking differently: Take 1 tablet by mouth daily in the afternoon.  09/24/16   Fritzi Mandes, MD  oxyCODONE-acetaminophen (PERCOCET/ROXICET) 5-325 MG tablet Take 1 tablet by mouth every 4 (four) hours as needed for severe pain. 02/09/20   Paulette Blanch, MD  pantoprazole (PROTONIX) 40 MG tablet Take 1 tablet (40 mg total) by mouth 2 (two) times daily. 09/23/16   Fritzi Mandes, MD  potassium chloride 20 MEQ TBCR Take 20 mEq by mouth daily. Patient taking differently: Take 20 mEq by mouth daily in the afternoon.  06/18/17   Hillary Bow, MD  predniSONE (DELTASONE) 10 MG tablet Take prednisone 40 mg (4 tabs) for 3 days then take prednisone 30 mg (3 tabs) for 3 days then take prednisone 20 mg (2 tabs) for 3 days then resume your home 10 mg daily and ask Dr. Patsey Berthold to refill 01/29/20   Danford, Suann Larry, MD  Spacer/Aero-Holding Chambers (AEROCHAMBER MV) inhaler Use as instructed 10/05/19   Tyler Pita, MD  sulfamethoxazole-trimethoprim (BACTRIM DS) 800-160 MG tablet Take 1 tablet by mouth 2 (two) times daily. 01/29/20   Danford, Suann Larry, MD    Allergies Amoxicillin  Family History  Problem Relation Age of Onset  . Heart disease Mother     Social History Social History   Tobacco Use  . Smoking status: Former Smoker    Packs/day: 2.00    Years: 50.00    Pack years: 100.00    Types: Cigarettes    Quit date:  10/14/2015    Years since quitting: 4.3  . Smokeless tobacco: Former Network engineer Use Topics  . Alcohol use: Yes    Alcohol/week: 28.0 standard drinks    Types: 28 Cans of beer per week    Comment: 6 12 oz cans of beer/day  . Drug use: No    Review of Systems  Constitutional: No fever/chills Eyes: No visual changes. ENT: No sore throat. Cardiovascular: Positive for bilateral rib pain.  Denies chest pain. Respiratory: Denies shortness of breath. Gastrointestinal: No abdominal pain.  No nausea, no vomiting.  No diarrhea.  No constipation. Genitourinary: Negative for dysuria. Musculoskeletal: Positive for back pain. Skin: Negative for rash. Neurological: Negative for headaches, focal weakness or numbness.   ____________________________________________   PHYSICAL EXAM:  VITAL SIGNS: ED Triage Vitals  Enc Vitals Group  BP 02/09/20 0054 (!) 148/90     Pulse Rate 02/09/20 0054 89     Resp 02/09/20 0054 20     Temp 02/09/20 0054 98.2 F (36.8 C)     Temp Source 02/09/20 0054 Oral     SpO2 02/09/20 0054 99 %     Weight 02/09/20 0104 214 lb 15.2 oz (97.5 kg)     Height 02/09/20 0104 5\' 8"  (1.727 m)     Head Circumference --      Peak Flow --      Pain Score 02/09/20 0104 10     Pain Loc --      Pain Edu? --      Excl. in [place]? --     Constitutional: Alert and oriented.  Chronically ill appearing and in no acute distress. Eyes: Conjunctivae are normal. PERRL. EOMI. Head: Atraumatic. Nose: No congestion/rhinnorhea. Mouth/Throat: Mucous membranes are moist.   Neck: No stridor.   Cardiovascular: Normal rate, regular rhythm. Grossly normal heart sounds.  Good peripheral circulation. Respiratory: Normal respiratory effort.  No retractions. Lungs slightly diminished bibasilarly.  Bilateral lower ribs tender to palpation. Gastrointestinal: Soft and nontender to light or deep palpation. No distention. No abdominal bruits. No CVA tenderness. Musculoskeletal: No spinal  tenderness to palpation.  Bilateral paraspinal thoracic back tenderness to palpation.  No lower extremity tenderness nor edema.  No joint effusions. Neurologic:  Normal speech and language. No gross focal neurologic deficits are appreciated.  Skin:  Skin is warm, dry and intact. No rash noted. Psychiatric: Mood and affect are normal. Speech and behavior are normal.  ____________________________________________   LABS (all labs ordered are listed, but only abnormal results are displayed)  Labs Reviewed  BASIC METABOLIC PANEL - Abnormal; Notable for the following components:      Result Value   Sodium 128 (*)    Chloride 89 (*)    Glucose, Bld 105 (*)    Calcium 8.8 (*)    All other components within normal limits  CBC - Abnormal; Notable for the following components:   WBC 11.1 (*)    RBC 3.53 (*)    Hemoglobin 11.7 (*)    HCT 34.1 (*)    All other components within normal limits  ETHANOL - Abnormal; Notable for the following components:   Alcohol, Ethyl (B) 68 (*)    All other components within normal limits  URINALYSIS, COMPLETE (UACMP) WITH MICROSCOPIC - Abnormal; Notable for the following components:   Color, Urine STRAW (*)    APPearance CLEAR (*)    All other components within normal limits  TROPONIN I (HIGH SENSITIVITY)  TROPONIN I (HIGH SENSITIVITY)   ____________________________________________  EKG  ED ECG REPORT I, Olivya Sobol J, the attending physician, personally viewed and interpreted this ECG.   Date: 02/09/2020  EKG Time: 0105  Rate: 86  Rhythm: normal EKG, normal sinus rhythm  Axis: Normal  Intervals:none  ST&T Change: Nonspecific  ____________________________________________  RADIOLOGY  ED MD interpretation: Bulla in right lower lobe; no PE  Official radiology report(s): DG Chest 2 View  Result Date: 02/09/2020 CLINICAL DATA:  Shortness of breath, back pain EXAM: CHEST - 2 VIEW COMPARISON:  01/24/2020 FINDINGS: Large bulla in the right lower  lung as seen on prior CT with adjacent atelectasis or scarring. Right basilar atelectasis or scarring. Left lung clear. Heart is normal size. No effusions or acute bony abnormality. IMPRESSION: Large air-filled bulla in the right lower lung with adjacent atelectasis or scarring. No active disease. Electronically  Signed   By: Rolm Baptise M.D.   On: 02/09/2020 01:36   CT Angio Chest PE W/Cm &/Or Wo Cm  Result Date: 02/09/2020 CLINICAL DATA:  Chest and back pain. EXAM: CT ANGIOGRAPHY CHEST WITH CONTRAST TECHNIQUE: Multidetector CT imaging of the chest was performed using the standard protocol during bolus administration of intravenous contrast. Multiplanar CT image reconstructions and MIPs were obtained to evaluate the vascular anatomy. CONTRAST:  166mL OMNIPAQUE IOHEXOL 350 MG/ML SOLN COMPARISON:  01/28/2020 FINDINGS: Cardiovascular: The heart is normal in size. No pericardial effusion. There is moderate tortuosity, mild ectasia and age advanced atherosclerotic calcification involving the aorta and branch vessels including extensive three-vessel coronary artery calcifications. No aortic dissection or focal aneurysm. The pulmonary arterial tree is fairly well opacified. No filling defects to suggest pulmonary embolism. Mediastinum/Nodes: Stable scattered mediastinal and hilar lymph nodes. No mass or overt adenopathy. The esophagus is grossly normal. Lungs/Pleura: Severe emphysematous changes and pulmonary scarring. No acute overlying pulmonary process is identified. Large right middle lobe bulla is noted with adjacent compressive atelectasis. No worrisome pulmonary lesions. No pleural effusion. Upper Abdomen: No significant upper abdominal findings. Stable bilateral adrenal gland adenomas. Stable abdominal aortic calcifications. Musculoskeletal: No chest wall mass, supraclavicular or axillary adenopathy. The bony thorax is intact. Stable appearing mild lower thoracic compression deformities. Review of the MIP  images confirms the above findings. IMPRESSION: 1. No CT findings for pulmonary embolism. 2. Stable tortuosity, mild ectasia and age advanced atherosclerotic calcification involving the aorta and branch vessels including extensive three-vessel coronary artery calcifications. 3. Severe emphysematous changes and pulmonary scarring. No acute overlying pulmonary process. 4. Stable bilateral adrenal gland adenomas. 5. Emphysema and aortic atherosclerosis. Aortic Atherosclerosis (ICD10-I70.0) and Emphysema (ICD10-J43.9). Aortic Atherosclerosis (ICD10-I70.0) and Emphysema (ICD10-J43.9). Electronically Signed   By: Marijo Sanes M.D.   On: 02/09/2020 06:30    ____________________________________________   PROCEDURES  Procedure(s) performed (including Critical Care):  Procedures   ____________________________________________   INITIAL IMPRESSION / ASSESSMENT AND PLAN / ED COURSE  As part of my medical decision making, I reviewed the following data within the [place] notes reviewed and incorporated, Labs reviewed, EKG interpreted, Old chart reviewed, Radiograph reviewed, Notes from prior ED visits and [place] Controlled Substance Database     Austin Patterson was evaluated in Emergency Department on 02/09/2020 for the symptoms described in the history of present illness. He was evaluated in the context of the global COVID-19 pandemic, which necessitated consideration that the patient might be at risk for infection with the SARS-CoV-2 virus that causes COVID-19. Institutional protocols and algorithms that pertain to the evaluation of patients at risk for COVID-19 are in a state of rapid change based on information released by regulatory bodies including the CDC and federal and state organizations. These policies and algorithms were followed during the patient's care in the ED.    64 year old male with COPD on 2 L nasal cannula oxygen presenting with nontraumatic thoracic back  pain.  Differential diagnosis includes but is not limited to musculoskeletal, pneumonia, metabolic, PE, etc.  Laboratory results notable for hyponatremia which is stable from 11 days ago and elevated EtOH.  I have personally reviewed patient's records and see that he had a noncontrasted CT scan on his most recent hospitalization.  Given that PE is in the differential for nontraumatic thoracic back pain, will obtain CTA to evaluate for PE.   Clinical Course as of Feb 08 649  Fri Feb 09, 2020  0638 Patient resting no  acute distress.  Updated him of CTA results.  Discharge home with limited prescription for Percocet.  Strict return precautions given.  Patient verbalizes understanding and agrees with plan of care.   [JS]    Clinical Course User Index [JS] Paulette Blanch, MD     ____________________________________________   FINAL CLINICAL IMPRESSION(S) / ED DIAGNOSES  Final diagnoses:  Acute bilateral thoracic back pain     ED Discharge Orders         Ordered    oxyCODONE-acetaminophen (PERCOCET/ROXICET) 5-325 MG tablet  Every 4 hours PRN     02/09/20 0640           Note:  This document was prepared using Dragon voice recognition software and may include unintentional dictation errors.   Paulette Blanch, MD 02/09/20 [phone]

## 2020-02-12 ENCOUNTER — Ambulatory Visit: Payer: Medicare HMO | Admitting: Pulmonary Disease

## 2020-02-12 ENCOUNTER — Other Ambulatory Visit
Admission: RE | Admit: 2020-02-12 | Discharge: 2020-02-12 | Disposition: A | Discharge status: Home / Self Care | Payer: Medicare HMO | Source: Ambulatory Visit | Attending: Pulmonary Disease | Admitting: Pulmonary Disease

## 2020-02-12 ENCOUNTER — Encounter: Payer: Self-pay | Admitting: Pulmonary Disease

## 2020-02-12 ENCOUNTER — Other Ambulatory Visit: Payer: Self-pay

## 2020-02-12 VITALS — BP 118/74 | HR 90 | Ht 67.0 in | Wt 209.0 lb

## 2020-02-12 DIAGNOSIS — M546 Pain in thoracic spine: Secondary | ICD-10-CM

## 2020-02-12 DIAGNOSIS — J9611 Chronic respiratory failure with hypoxia: Secondary | ICD-10-CM | POA: Insufficient documentation

## 2020-02-12 DIAGNOSIS — J441 Chronic obstructive pulmonary disease with (acute) exacerbation: Secondary | ICD-10-CM

## 2020-02-12 DIAGNOSIS — J4550 Severe persistent asthma, uncomplicated: Secondary | ICD-10-CM

## 2020-02-12 DIAGNOSIS — G8929 Other chronic pain: Secondary | ICD-10-CM

## 2020-02-12 MED ORDER — TIZANIDINE HCL 2 MG PO CAPS: 2.0000 mg | ORAL_CAPSULE | Freq: Three times a day (TID) | ORAL | 1 refills | Status: DC | PRN

## 2020-02-12 NOTE — Progress Notes (Signed)
Subjective:    Patient ID: Austin Patterson, male    DOB: Nov 01, 1955, 64 y.o.   MRN: [id number]  HPI Patient is a 64 year old with COPD FEV1 1.23 L or 39% of predicted noted on PFTs of 04/2019.  Patient also has a reversible airways component, IgE at 1,950 and therefore an asthma component.  Aspergillus titers have been requested previously however the patient has not had these drawn.  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  Previously the patient was on [place] however he did exacerbate while on this medication.  It is unclear how he was able to get this medication given that he has not met criteria for supplementation.  The patient was started on Dupixent for management of his asthmatic component.  He has only had those of the medication.  He has been referred to medication assistance for this.  He was evaluated in the emergency room on 23 April due to bilateral thoracic pain and back pain.  He has not had relief from this.  Today this is his main complaint.  He continues to have issues with shortness of breath.  He is also engaged in ongoing alcohol use.  Meant of cigarettes.  He has had some increased cough with no change in sputum production and no abscesses.  Review of his CT performed on 3 April in the emergency room shows significant compression deformities on his thoracic spine around the area of where his pain is present.  He has not had any fevers, chills or sweats.  No other complaint.  He appears very uncomfortable today.  He is on prednisone  Review of Systems A 10 point review of systems was performed and it is as noted above otherwise negative.  Current Meds  Medication Sig  . albuterol (VENTOLIN HFA) 108 (90 Base) MCG/ACT inhaler Inhale 2 puffs into the lungs every 6 (six) hours as needed for wheezing or  shortness of breath.  [name] amLODipine (NORVASC) 10 MG tablet Take 1 tablet (10 mg total) by mouth daily.  [name] aspirin EC 81 MG tablet Take 1 tablet (81 mg total) by mouth daily.  [name] atorvastatin (LIPITOR) 40 MG tablet Take 1 tablet (40 mg total) by mouth at bedtime.  . calcium-vitamin D (OSCAL WITH D) 500-200 MG-UNIT tablet Take 2 tablets by mouth daily with breakfast. (Patient taking differently: Take 2 tablets by mouth daily in the afternoon. )  . cetirizine (ZYRTEC) 10 MG tablet Take 10 mg by mouth at bedtime.  . clopidogrel (PLAVIX) 75 MG tablet Take 1 tablet (75 mg total) by mouth daily.  [name] EPINEPHrine 0.3 mg/0.3 mL IJ SOAJ injection Inject 0.3 mg into the muscle as needed for anaphylaxis.   [name] Fluticasone-Salmeterol (ADVAIR DISKUS) 250-50 MCG/DOSE AEPB Inhale 1 puff into the lungs 2 (two) times daily.  [name] ipratropium-albuterol (DUONEB) 0.5-2.5 (3) MG/3ML SOLN Take 3 mLs by nebulization 4 (four) times daily as needed (wheezing/shortness of breath).  . losartan-hydrochlorothiazide (HYZAAR) 100-12.5 MG tablet Take 1 tablet by mouth daily.  . magnesium oxide (MAG-OX) 400 (241.3 Mg) MG tablet Take 1 tablet (400 mg total) by mouth daily. (Patient taking differently: Take 400 mg by mouth daily in the afternoon. )  . metoprolol succinate (TOPROL-XL) 50 MG 24 hr tablet Take 1 tablet (50 mg total) by mouth daily.  . Multiple Vitamin (MULTIVITAMIN WITH MINERALS) TABS tablet Take 1 tablet by  mouth daily. (Patient taking differently: Take 1 tablet by mouth daily in the afternoon. )  . pantoprazole (PROTONIX) 40 MG tablet Take 1 tablet (40 mg total) by mouth 2 (two) times daily.  . potassium chloride 20 MEQ TBCR Take 20 mEq by mouth daily. (Patient taking differently: Take 20 mEq by mouth daily in the afternoon. )  . Spacer/Aero-Holding Chambers (AEROCHAMBER MV) inhaler Use as instructed  . [DISCONTINUED] oxyCODONE-acetaminophen (PERCOCET/ROXICET) 5-325 MG tablet Take 1 tablet by mouth every 4 (four) hours as  needed for severe pain.  . [DISCONTINUED] predniSONE (DELTASONE) 10 MG tablet Take prednisone 40 mg (4 tabs) for 3 days then take prednisone 30 mg (3 tabs) for 3 days then take prednisone 20 mg (2 tabs) for 3 days then resume your home 10 mg daily and ask Dr. Patsey Berthold to refill (Patient taking differently: Take 10 mg by mouth daily. Take prednisone 40 mg (4 tabs) for 3 days then take prednisone 30 mg (3 tabs) for 3 days then take prednisone 20 mg (2 tabs) for 3 days then resume your home 10 mg daily and ask Dr. Patsey Berthold to refill)   Allergies  Allergen Reactions  . Amoxicillin Anaphylaxis   Immunization History  Administered Date(s) Administered  . Influenza Inj Mdck Quad Pf 08/10/2019  . Influenza,inj,Quad PF,6+ Mos 10/15/2015, 08/31/2016  . Pneumococcal Polysaccharide-23 10/15/2015     Objective:   Physical Exam BP 118/74 (BP Location: Left Arm, Cuff Size: Normal)   Pulse 90   Ht '5\' 7"'  (1.702 m)   Wt 209 lb (94.8 kg)   SpO2 94%   BMI 32.73 kg/m Pain: 8/10  GENERAL: Uncomfortable appearing gentleman, chronic use of accessories, presents in transport chair. HEAD: Normocephalic, atraumatic.  EYES: Pupils equal, round, reactive to light.  No scleral icterus.  MOUTH: Skin requirements NECK: Supple. No thyromegaly. Trachea midline. No JVD.  No adenopathy. PULMONARY: Creased AP diameter, significant kyphosis, distant breath sounds, coarse, no wheezes.  No rhonchi CARDIOVASCULAR: S1 and S2. Regular rate and rhythm.  Distant heart tones no murmur appreciated. GASTROINTESTINAL: Benign. MUSCULOSKELETAL: No joint deformity, no clubbing, no edema.  NEUROLOGIC: No overt focal deficit.  Speech is fluent.  Awake, alert. SKIN: Intact,warm,dry. PSYCH: Irascible.   Assessment & Plan:   Problem List Items Addressed This Visit      Respiratory   COPD exacerbation ([place]) - Primary    Other Visit Diagnoses    Severe persistent asthma without complication       Acute bilateral thoracic back  pain       Relevant Medications   tizanidine (ZANAFLEX) 2 MG capsule   Other Relevant Orders   MR THORACIC SPINE W WO CONTRAST (Completed)   Chronic respiratory failure with hypoxia ([place])       Relevant Orders   Alpha-1-antitrypsin (Completed)      Meds ordered this encounter  Medications  . tizanidine (ZANAFLEX) 2 MG capsule    Sig: Take 1 capsule (2 mg total) by mouth 3 (three) times daily as needed for muscle spasms.    Dispense:  30 capsule    Refill:  1   Discussion:  This is a very complex patient with asthma COPD overlap syndrome.  He has severe persistent asthma with elevated IgE.  He has emphysema however does not follow the pattern of alpha-1.  He has MZ phenotype with normal alpha-1 levels.  Prior pulmonary scarring/fibrosis follows a pattern of potential hypersensitivity pneumonitis which fits his asthmatic component.  He now presents with increasing back  and bilateral thoracic pain.  Suspect that this is due to new compression fractures.  Recommend obtaining MRI of the spine and will provide him with muscle relaxer to see if it helps with his pain.  He is already on p.o. oxycodone prescribed elsewhere.  We will refer him for kyphoplasty if needed.  Will obtain alpha-1 antitrypsin level.  He previously had been on augmentation therapy however with normal alpha-1's this is not indicated.  Levels of alpha-1 should be at 57 or below as a cutoff for augmentation.  Although a small percentage of MZ phenotypes do show evidence of deficiency this patient has not shown deficient levels.  He should continue Dupixent, he has not had adequate dosing on this medication for his severe persistent asthma.  We will see the patient in follow-up in 2 to 3 weeks time either with me or the nurse practitioner.  Renold Don, MD [facility]  *This note was dictated using voice recognition software/Dragon.  Despite best efforts to proofread, errors can occur which can change the meaning.   Any change was purely unintentional.

## 2020-02-12 NOTE — Patient Instructions (Signed)
We are setting up an MRI of your spine to evaluate for potential compression fractures  We are checking another alpha level  We are investigating what is going on with your Dupixent  I am sending a prescription for a muscle relaxer to see if it helps with your back pain  You may need evaluation by a spine specialist once we get the MRI  We will see you in follow-up in 2 to 3 weeks with either me or the APP (nurse practitioner)

## 2020-02-13 LAB — ALPHA-1-ANTITRYPSIN: A-1 Antitrypsin, Ser: 103 mg/dL (ref 101–187)

## 2020-02-16 ENCOUNTER — Emergency Department: Payer: Medicare HMO

## 2020-02-16 ENCOUNTER — Observation Stay
Admission: EM | Admit: 2020-02-16 | Discharge: 2020-02-17 | Disposition: A | Discharge status: Home / Self Care | Payer: Medicare HMO | Attending: Hospitalist | Admitting: Hospitalist

## 2020-02-16 ENCOUNTER — Other Ambulatory Visit: Payer: Self-pay

## 2020-02-16 DIAGNOSIS — E871 Hypo-osmolality and hyponatremia: Principal | ICD-10-CM | POA: Diagnosis present

## 2020-02-16 DIAGNOSIS — D3502 Benign neoplasm of left adrenal gland: Secondary | ICD-10-CM | POA: Insufficient documentation

## 2020-02-16 DIAGNOSIS — Z20822 Contact with and (suspected) exposure to covid-19: Secondary | ICD-10-CM | POA: Diagnosis not present

## 2020-02-16 DIAGNOSIS — Z7902 Long term (current) use of antithrombotics/antiplatelets: Secondary | ICD-10-CM | POA: Insufficient documentation

## 2020-02-16 DIAGNOSIS — I10 Essential (primary) hypertension: Secondary | ICD-10-CM | POA: Insufficient documentation

## 2020-02-16 DIAGNOSIS — Z79899 Other long term (current) drug therapy: Secondary | ICD-10-CM | POA: Insufficient documentation

## 2020-02-16 DIAGNOSIS — Z7951 Long term (current) use of inhaled steroids: Secondary | ICD-10-CM | POA: Insufficient documentation

## 2020-02-16 DIAGNOSIS — I2510 Atherosclerotic heart disease of native coronary artery without angina pectoris: Secondary | ICD-10-CM | POA: Diagnosis not present

## 2020-02-16 DIAGNOSIS — D3501 Benign neoplasm of right adrenal gland: Secondary | ICD-10-CM | POA: Diagnosis not present

## 2020-02-16 DIAGNOSIS — J439 Emphysema, unspecified: Secondary | ICD-10-CM | POA: Insufficient documentation

## 2020-02-16 DIAGNOSIS — E785 Hyperlipidemia, unspecified: Secondary | ICD-10-CM | POA: Diagnosis not present

## 2020-02-16 DIAGNOSIS — Z7982 Long term (current) use of aspirin: Secondary | ICD-10-CM | POA: Diagnosis not present

## 2020-02-16 DIAGNOSIS — J84112 Idiopathic pulmonary fibrosis: Secondary | ICD-10-CM | POA: Diagnosis not present

## 2020-02-16 DIAGNOSIS — M549 Dorsalgia, unspecified: Secondary | ICD-10-CM | POA: Insufficient documentation

## 2020-02-16 DIAGNOSIS — K2100 Gastro-esophageal reflux disease with esophagitis, without bleeding: Secondary | ICD-10-CM | POA: Insufficient documentation

## 2020-02-16 DIAGNOSIS — Z955 Presence of coronary angioplasty implant and graft: Secondary | ICD-10-CM | POA: Insufficient documentation

## 2020-02-16 DIAGNOSIS — J9611 Chronic respiratory failure with hypoxia: Secondary | ICD-10-CM | POA: Diagnosis not present

## 2020-02-16 DIAGNOSIS — Z7952 Long term (current) use of systemic steroids: Secondary | ICD-10-CM | POA: Diagnosis not present

## 2020-02-16 DIAGNOSIS — Z9981 Dependence on supplemental oxygen: Secondary | ICD-10-CM | POA: Insufficient documentation

## 2020-02-16 DIAGNOSIS — Z87891 Personal history of nicotine dependence: Secondary | ICD-10-CM | POA: Insufficient documentation

## 2020-02-16 LAB — CBC WITH DIFFERENTIAL/PLATELET
Abs Immature Granulocytes: 0.02 10*3/uL (ref 0.00–0.07)
Basophils Absolute: 0 10*3/uL (ref 0.0–0.1)
Basophils Relative: 1 %
Eosinophils Absolute: 0.1 10*3/uL (ref 0.0–0.5)
Eosinophils Relative: 1 %
HCT: 33.6 % — ABNORMAL LOW (ref 39.0–52.0)
Hemoglobin: 11.7 g/dL — ABNORMAL LOW (ref 13.0–17.0)
Immature Granulocytes: 0 %
Lymphocytes Relative: 14 %
Lymphs Abs: 0.8 10*3/uL (ref 0.7–4.0)
MCH: 32.9 pg (ref 26.0–34.0)
MCHC: 34.8 g/dL (ref 30.0–36.0)
MCV: 94.4 fL (ref 80.0–100.0)
Monocytes Absolute: 0.7 10*3/uL (ref 0.1–1.0)
Monocytes Relative: 11 %
Neutro Abs: 4.3 10*3/uL (ref 1.7–7.7)
Neutrophils Relative %: 73 %
Platelets: 262 10*3/uL (ref 150–400)
RBC: 3.56 MIL/uL — ABNORMAL LOW (ref 4.22–5.81)
RDW: 13.7 % (ref 11.5–15.5)
WBC: 6 10*3/uL (ref 4.0–10.5)
nRBC: 0 % (ref 0.0–0.2)

## 2020-02-16 LAB — RESPIRATORY PANEL BY RT PCR (FLU A&B, COVID)
Influenza A by PCR: NEGATIVE
Influenza B by PCR: NEGATIVE
SARS Coronavirus 2 by RT PCR: NEGATIVE

## 2020-02-16 LAB — BASIC METABOLIC PANEL
Anion gap: 14 (ref 5–15)
BUN: 6 mg/dL — ABNORMAL LOW (ref 8–23)
CO2: 27 mmol/L (ref 22–32)
Calcium: 9 mg/dL (ref 8.9–10.3)
Chloride: 79 mmol/L — ABNORMAL LOW (ref 98–111)
Creatinine, Ser: 0.63 mg/dL (ref 0.61–1.24)
GFR calc Af Amer: >60 mL/min (ref >60)
GFR calc non Af Amer: >60 mL/min (ref >60)
Glucose, Bld: 115 mg/dL — ABNORMAL HIGH (ref 70–99)
Potassium: 3.4 mmol/L — ABNORMAL LOW (ref 3.5–5.1)
Sodium: 120 mmol/L — ABNORMAL LOW (ref 135–145)

## 2020-02-16 LAB — TROPONIN I (HIGH SENSITIVITY)
Troponin I (High Sensitivity): 5 ng/L (ref <18)
Troponin I (High Sensitivity): 4 ng/L (ref <18)

## 2020-02-16 LAB — OSMOLALITY: Osmolality: 272 mOsm/kg — ABNORMAL LOW (ref 275–295)

## 2020-02-16 MED ORDER — SODIUM CHLORIDE 0.9 % IV BOLUS
1000.0000 mL | Freq: Once | INTRAVENOUS | Status: AC
  Administered 2020-02-16: 14:00:00 1000 mL via INTRAVENOUS

## 2020-02-16 MED ORDER — LOSARTAN POTASSIUM-HCTZ 100-12.5 MG PO TABS: 1.0000 | ORAL_TABLET | Freq: Every day | ORAL | Status: DC

## 2020-02-16 MED ORDER — TIZANIDINE HCL 2 MG PO TABS
2.0000 mg | ORAL_TABLET | Freq: Three times a day (TID) | ORAL | Status: DC | PRN
  Filled 2020-02-16: qty 1

## 2020-02-16 MED ORDER — AMLODIPINE BESYLATE 10 MG PO TABS
10.0000 mg | ORAL_TABLET | Freq: Every day | ORAL | Status: DC
  Administered 2020-02-17: 10 mg via ORAL
  Filled 2020-02-16: qty 1

## 2020-02-16 MED ORDER — ASPIRIN EC 81 MG PO TBEC
81.0000 mg | DELAYED_RELEASE_TABLET | Freq: Every day | ORAL | Status: DC
  Administered 2020-02-17: 08:00:00 81 mg via ORAL
  Filled 2020-02-16: qty 1

## 2020-02-16 MED ORDER — FLUTICASONE FUROATE-VILANTEROL 200-25 MCG/INH IN AEPB
1.0000 | INHALATION_SPRAY | Freq: Every day | RESPIRATORY_TRACT | Status: DC
  Administered 2020-02-17: 1 via RESPIRATORY_TRACT
  Filled 2020-02-16: qty 28

## 2020-02-16 MED ORDER — ACETAMINOPHEN 500 MG PO TABS
1000.0000 mg | ORAL_TABLET | Freq: Three times a day (TID) | ORAL | Status: DC | PRN
  Administered 2020-02-16 – 2020-02-17 (×3): 1000 mg via ORAL
  Filled 2020-02-16 (×4): qty 2

## 2020-02-16 MED ORDER — POLYETHYLENE GLYCOL 3350 17 G PO PACK: 17.0000 g | PACK | Freq: Two times a day (BID) | ORAL | Status: DC | PRN

## 2020-02-16 MED ORDER — IPRATROPIUM-ALBUTEROL 0.5-2.5 (3) MG/3ML IN SOLN
3.0000 mL | Freq: Once | RESPIRATORY_TRACT | Status: AC
  Administered 2020-02-16: 10:00:00 3 mL via RESPIRATORY_TRACT
  Filled 2020-02-16: qty 9

## 2020-02-16 MED ORDER — IPRATROPIUM-ALBUTEROL 0.5-2.5 (3) MG/3ML IN SOLN
3.0000 mL | Freq: Once | RESPIRATORY_TRACT | Status: AC
  Administered 2020-02-16: 10:00:00 3 mL via RESPIRATORY_TRACT

## 2020-02-16 MED ORDER — ONDANSETRON HCL 4 MG/2ML IJ SOLN: 4.0000 mg | Freq: Four times a day (QID) | INTRAMUSCULAR | Status: DC | PRN

## 2020-02-16 MED ORDER — IPRATROPIUM-ALBUTEROL 0.5-2.5 (3) MG/3ML IN SOLN
3.0000 mL | Freq: Four times a day (QID) | RESPIRATORY_TRACT | Status: DC | PRN
  Administered 2020-02-16 – 2020-02-17 (×2): 3 mL via RESPIRATORY_TRACT
  Filled 2020-02-16 (×2): qty 3

## 2020-02-16 MED ORDER — ENOXAPARIN SODIUM 40 MG/0.4ML ~~LOC~~ SOLN
40.0000 mg | SUBCUTANEOUS | Status: DC
  Administered 2020-02-16: 21:00:00 40 mg via SUBCUTANEOUS
  Filled 2020-02-16: qty 0.4

## 2020-02-16 MED ORDER — CALCIUM CARBONATE ANTACID 500 MG PO CHEW: 1.0000 | CHEWABLE_TABLET | Freq: Three times a day (TID) | ORAL | Status: DC | PRN

## 2020-02-16 MED ORDER — ONDANSETRON 4 MG PO TBDP
4.0000 mg | ORAL_TABLET | Freq: Three times a day (TID) | ORAL | Status: DC | PRN
  Filled 2020-02-16: qty 1

## 2020-02-16 MED ORDER — ATORVASTATIN CALCIUM 20 MG PO TABS
40.0000 mg | ORAL_TABLET | Freq: Every day | ORAL | Status: DC
  Administered 2020-02-16: 21:00:00 40 mg via ORAL
  Filled 2020-02-16: qty 2

## 2020-02-16 MED ORDER — GUAIFENESIN-DM 100-10 MG/5ML PO SYRP
10.0000 mL | ORAL_SOLUTION | Freq: Four times a day (QID) | ORAL | Status: DC | PRN
  Filled 2020-02-16: qty 10

## 2020-02-16 MED ORDER — PANTOPRAZOLE SODIUM 40 MG PO TBEC
40.0000 mg | DELAYED_RELEASE_TABLET | Freq: Two times a day (BID) | ORAL | Status: DC
  Administered 2020-02-16 – 2020-02-17 (×2): 40 mg via ORAL
  Filled 2020-02-16 (×2): qty 1

## 2020-02-16 MED ORDER — METOPROLOL SUCCINATE ER 50 MG PO TB24
50.0000 mg | ORAL_TABLET | Freq: Every day | ORAL | Status: DC
  Administered 2020-02-17: 08:00:00 50 mg via ORAL
  Filled 2020-02-16: qty 1

## 2020-02-16 MED ORDER — PREDNISONE 10 MG PO TABS
10.0000 mg | ORAL_TABLET | ORAL | Status: DC
  Administered 2020-02-17: 08:00:00 10 mg via ORAL
  Filled 2020-02-16: qty 1

## 2020-02-16 MED ORDER — LOSARTAN POTASSIUM 50 MG PO TABS
100.0000 mg | ORAL_TABLET | Freq: Every day | ORAL | Status: DC
  Administered 2020-02-17: 08:00:00 100 mg via ORAL
  Filled 2020-02-16: qty 2

## 2020-02-16 MED ORDER — DOCUSATE SODIUM 100 MG PO CAPS: 100.0000 mg | ORAL_CAPSULE | Freq: Two times a day (BID) | ORAL | Status: DC | PRN

## 2020-02-16 MED ORDER — METHYLPREDNISOLONE SODIUM SUCC 125 MG IJ SOLR
125.0000 mg | Freq: Once | INTRAMUSCULAR | Status: AC
  Administered 2020-02-16: 10:00:00 125 mg via INTRAVENOUS
  Filled 2020-02-16: qty 2

## 2020-02-16 MED ORDER — CLOPIDOGREL BISULFATE 75 MG PO TABS
75.0000 mg | ORAL_TABLET | Freq: Every day | ORAL | Status: DC
  Administered 2020-02-17: 08:00:00 75 mg via ORAL
  Filled 2020-02-16: qty 1

## 2020-02-16 MED ORDER — ALUM & MAG HYDROXIDE-SIMETH 200-200-20 MG/5ML PO SUSP
15.0000 mL | Freq: Four times a day (QID) | ORAL | Status: DC | PRN
  Administered 2020-02-17: 01:00:00 15 mL via ORAL
  Filled 2020-02-16: qty 30

## 2020-02-16 MED ORDER — IPRATROPIUM-ALBUTEROL 0.5-2.5 (3) MG/3ML IN SOLN
3.0000 mL | Freq: Once | RESPIRATORY_TRACT | Status: AC
  Administered 2020-02-16: 3 mL via RESPIRATORY_TRACT

## 2020-02-16 MED ORDER — HYDROCHLOROTHIAZIDE 12.5 MG PO CAPS
12.5000 mg | ORAL_CAPSULE | Freq: Every day | ORAL | Status: DC
  Administered 2020-02-17: 08:00:00 12.5 mg via ORAL
  Filled 2020-02-16: qty 1

## 2020-02-16 NOTE — ED Notes (Addendum)
Pt assisted with urinal by tech.

## 2020-02-16 NOTE — ED Triage Notes (Signed)
Pt arrives via [facility] from home for reports of [facility] and central back pain x 3 months. Pt arrives wearing 3L [place] O2 whis he wears chronically. Pt states he saw his pulm doctor on Monday who told him that he had hairline fractures in his spine. Pt speaking in full sentences without difficulty. NAD at this time, A&Ox4. Pt arrives with O2 tank and holder from home.

## 2020-02-16 NOTE — H&P (Signed)
History and Physical    Austin Patterson DOB: [birth year] DOA: 02/16/2020  PCP: Laneta Simmers, NP  Patient coming from: home  I have personally briefly reviewed patient's old medical records in [place]  Chief Complaint: dyspnea, back pain  HPI: Austin Patterson is a 64 y.o. Caucasian male with medical history significant of severe bullous emphysema, pulmonary fibrosis, chronic hypoxic respiratory failure on 3L at baseline, chronic hyponatremia, HTN, CAD who presented with worsening dyspnea and chronic back pain.  Pt reported waking up this morning with more dyspnea, headache, and back pain.  Back pain has been present for several weeks, and pt said he was told it was from some spine fracture that was seen on recent imaging.  Pt has chronic dyspnea and cough at baseline.  No fever, chest pain, abdominal pain, N/V/D, dysuria.  Pt reported drinking a lot of water at home and urinating a lot.  Pt lives alone and ambulates without assistance.  Pt was recently hospitalized for COPD exacerbation in April 2021, and just saw his outpatient pulm on 02/12/20 with no significant changes to his regimen.   ED Course: initial vitals: afebrile, pulse 86, BP 148/75, sating 97% on 3L.  Labs notable for Na 120, K 3.4.  Pt received IV solumedrol 125 mg, DuoNeb x3, 1L NS in the ED before admission for observation.   Assessment/Plan Active Problems:   Hyponatremia  # Acute on chronic hyponatremia # Possible SIADH --Presented with Na 120.  Has had hyponatremia since 2013 since available records.  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  Pt also admitted to drinking a lot of free water, which could also contribute to hyponatremia. PLAN: --Fluid restriction to 1200 cc per day --Monitor Na q8h --serum and urine osm, urine sodium --outpatient referral to endocrin  # Chronic hypoxic respiratory failure on 3L at home # Severe bullous emphysema # Pulmonary fibrosis --Stable  on 3L home O2 on presentation.  Recently treated for COPD exacerbation.  Follows with Dr. Patsey Berthold outpatient.  Currently on self-planned prednisone taper, 10 mg every other day. PLAN: --continue home Advair as Breo --DuoNeb PRN --Prednisone 10 mg every other day  # Back pain --CTA chest on 02/09/20 showed "Stable appearing mild lower thoracic compression deformities." PLAN: --continue home tizanidine PRN  # HTN --continue home amlodipine, losartan-HCTZ, and Toprol  # CAD --continue home ASA, plavix, and statin  # Bilateral adrenal gland adenomas --First mentioned on CT chest on 01/28/20. --Referral for outpatient endocrin followup   DVT prophylaxis: Lovenox SQ Code Status: Full code  Family Communication:   Disposition Plan: home  Consults called: none Admission status: Observation   Review of Systems: As per HPI otherwise 10 point review of systems negative.   Past Medical History:  Diagnosis Date  . COPD (chronic obstructive pulmonary disease) ([place])   . Hyperlipidemia   . Hypertension   . Pulmonary fibrosis ([place]) 11/2015    Past Surgical History:  Procedure Laterality Date  . CORONARY STENT PLACEMENT    . ESOPHAGOGASTRODUODENOSCOPY (EGD) WITH PROPOFOL N/A 09/23/2016   Procedure: ESOPHAGOGASTRODUODENOSCOPY (EGD) WITH PROPOFOL;  Surgeon: [name], MD;  Location: [facility] ENDOSCOPY;  Service: Endoscopy;  Laterality: N/A;  . SHOULDER ACROMIOPLASTY       reports that he quit smoking about 4 years ago. His smoking use included cigarettes. He has a 100.00 pack-year smoking history. He has quit using smokeless tobacco. He reports current alcohol use of about 28.0 standard drinks of alcohol per week. He  reports that he does not use drugs.  Allergies  Allergen Reactions  . Amoxicillin Anaphylaxis    Family History  Problem Relation Age of Onset  . Heart disease Mother      Prior to Admission medications   Medication Sig Start Date End Date Taking? Authorizing  Provider  albuterol (VENTOLIN HFA) 108 (90 Base) MCG/ACT inhaler Inhale 2 puffs into the lungs every 6 (six) hours as needed for wheezing or shortness of breath. 01/29/20   Danford, Suann Larry, MD  amLODipine (NORVASC) 10 MG tablet Take 1 tablet (10 mg total) by mouth daily. 08/19/16   Tawni Millers, MD  aspirin EC 81 MG tablet Take 1 tablet (81 mg total) by mouth daily. 08/19/16   Tawni Millers, MD  atorvastatin (LIPITOR) 40 MG tablet Take 1 tablet (40 mg total) by mouth at bedtime. 08/19/16   Tawni Millers, MD  calcium-vitamin D (OSCAL WITH D) 500-200 MG-UNIT tablet Take 2 tablets by mouth daily with breakfast. Patient taking differently: Take 2 tablets by mouth daily in the afternoon.  09/24/16   Fritzi Mandes, MD  cetirizine (ZYRTEC) 10 MG tablet Take 10 mg by mouth at bedtime. 08/19/16   [provider]  clopidogrel (PLAVIX) 75 MG tablet Take 1 tablet (75 mg total) by mouth daily. 08/19/16   Tawni Millers, MD  Dupilumab (DUPIXENT) 300 MG/2ML SOPN Inject 2 mLs into the skin every 14 (fourteen) days. Patient not taking: Reported on 02/12/2020 12/05/19   Tyler Pita, MD  EPINEPHrine 0.3 mg/0.3 mL IJ SOAJ injection Inject 0.3 mg into the muscle as needed for anaphylaxis.     [provider]  Fluticasone-Salmeterol (ADVAIR DISKUS) 250-50 MCG/DOSE AEPB Inhale 1 puff into the lungs 2 (two) times daily. 10/31/19 10/30/20  Tyler Pita, MD  ipratropium-albuterol (DUONEB) 0.5-2.5 (3) MG/3ML SOLN Take 3 mLs by nebulization 4 (four) times daily as needed (wheezing/shortness of breath). 01/29/20   Danford, Suann Larry, MD  losartan-hydrochlorothiazide (HYZAAR) 100-12.5 MG tablet Take 1 tablet by mouth daily. 11/05/19   [provider]  magnesium oxide (MAG-OX) 400 (241.3 Mg) MG tablet Take 1 tablet (400 mg total) by mouth daily. Patient taking differently: Take 400 mg by mouth daily in the afternoon.  06/18/17   Hillary Bow, MD  metoprolol succinate (TOPROL-XL) 50 MG 24  hr tablet Take 1 tablet (50 mg total) by mouth daily. 08/19/16   Tawni Millers, MD  Multiple Vitamin (MULTIVITAMIN WITH MINERALS) TABS tablet Take 1 tablet by mouth daily. Patient taking differently: Take 1 tablet by mouth daily in the afternoon.  09/24/16   Fritzi Mandes, MD  oxyCODONE-acetaminophen (PERCOCET/ROXICET) 5-325 MG tablet Take 1 tablet by mouth every 4 (four) hours as needed for severe pain. 02/09/20   Paulette Blanch, MD  pantoprazole (PROTONIX) 40 MG tablet Take 1 tablet (40 mg total) by mouth 2 (two) times daily. 09/23/16   Fritzi Mandes, MD  potassium chloride 20 MEQ TBCR Take 20 mEq by mouth daily. Patient taking differently: Take 20 mEq by mouth daily in the afternoon.  06/18/17   Hillary Bow, MD  predniSONE (DELTASONE) 10 MG tablet Take prednisone 40 mg (4 tabs) for 3 days then take prednisone 30 mg (3 tabs) for 3 days then take prednisone 20 mg (2 tabs) for 3 days then resume your home 10 mg daily and ask Dr. Patsey Berthold to refill 01/29/20   Edwin Dada, MD  Spacer/Aero-Holding Chambers (AEROCHAMBER MV) inhaler Use as instructed 10/05/19  Tyler Pita, MD  tizanidine (ZANAFLEX) 2 MG capsule Take 1 capsule (2 mg total) by mouth 3 (three) times daily as needed for muscle spasms. 02/12/20   Tyler Pita, MD    Physical Exam: Vitals:   02/16/20 1415 02/16/20 1430 02/16/20 1445 02/16/20 1529  BP:    (!) 151/100  Pulse: 89 95 93   Resp: 15 14 18 19   Temp:      TempSrc:      SpO2: 96% 97% 95% 97%  Weight:      Height:        Constitutional: NAD, AAOx3 HEENT: conjunctivae and lids normal, EOMI CV: RRR no M,R,G. Distal pulses +2.  No cyanosis.   RESP: Mild scattered wheezes, normal respiratory effort, on 3L GI: +BS, NTND Extremities: No effusions, edema, or tenderness in BLE SKIN: warm, dry and intact Neuro: II - XII grossly intact.  Sensation intact Psych: Normal mood and affect.  Appropriate judgement and reason   Labs on Admission: I have personally  reviewed following labs and imaging studies  CBC: Recent Labs  Lab 02/16/20 1004  WBC 6.0  NEUTROABS 4.3  HGB 11.7*  HCT 33.6*  MCV 94.4  PLT 99991111   Basic Metabolic Panel: Recent Labs  Lab 02/16/20 1004  NA 120*  K 3.4*  CL 79*  CO2 27  GLUCOSE 115*  BUN 6*  CREATININE 0.63  CALCIUM 9.0   GFR: Estimated Creatinine Clearance: 102.7 mL/min (by C-G formula based on SCr of 0.63 mg/dL). Liver Function Tests: No results for input(s): AST, ALT, ALKPHOS, BILITOT, PROT, ALBUMIN in the last 168 hours. No results for input(s): LIPASE, AMYLASE in the last 168 hours. No results for input(s): AMMONIA in the last 168 hours. Coagulation Profile: No results for input(s): INR, PROTIME in the last 168 hours. Cardiac Enzymes: No results for input(s): CKTOTAL, CKMB, CKMBINDEX, TROPONINI in the last 168 hours. BNP (last 3 results) No results for input(s): PROBNP in the last 8760 hours. HbA1C: No results for input(s): HGBA1C in the last 72 hours. CBG: No results for input(s): GLUCAP in the last 168 hours. Lipid Profile: No results for input(s): CHOL, HDL, LDLCALC, TRIG, CHOLHDL, LDLDIRECT in the last 72 hours. Thyroid Function Tests: No results for input(s): TSH, T4TOTAL, FREET4, T3FREE, THYROIDAB in the last 72 hours. Anemia Panel: No results for input(s): VITAMINB12, FOLATE, FERRITIN, TIBC, IRON, RETICCTPCT in the last 72 hours. Urine analysis:    Component Value Date/Time   COLORURINE STRAW (A) 02/09/2020 0451   APPEARANCEUR CLEAR (A) 02/09/2020 0451   LABSPEC 1.011 02/09/2020 0451   PHURINE 5.0 02/09/2020 0451   GLUCOSEU NEGATIVE 02/09/2020 0451   HGBUR NEGATIVE 02/09/2020 0451   BILIRUBINUR NEGATIVE 02/09/2020 0451   KETONESUR NEGATIVE 02/09/2020 0451   PROTEINUR NEGATIVE 02/09/2020 0451   NITRITE NEGATIVE 02/09/2020 0451   LEUKOCYTESUR NEGATIVE 02/09/2020 0451    Radiological Exams on Admission: DG Chest Portable 1 View  Result Date: 02/16/2020 CLINICAL DATA:   Shortness of breath. EXAM: PORTABLE CHEST 1 VIEW COMPARISON:  02/09/2020.  CT 02/09/2020, 01/28/2020 FINDINGS: Mediastinum and hilar structures normal. Heart size normal. Prominent bullous changes again noted over the right lung. Bibasilar atelectasis/scarring again noted. Mild bibasilar infiltrates cannot be excluded. No pleural effusion or pneumothorax. Heart size normal. IMPRESSION: 1.  Prominent bullous changes again noted over the right lung. 2. Bibasilar atelectasis/scarring again noted. Mild bibasilar infiltrates cannot be excluded. Electronically Signed   By: Marcello Moores  Register   On: 02/16/2020 10:32  Enzo Bi MD [facility]  If 7PM-7AM, please contact night-coverage 02/16/2020, 3:50 PM

## 2020-02-16 NOTE — ED Provider Notes (Signed)
[facility] Emergency Department Provider Note   ____________________________________________   I have reviewed the triage vital signs and the nursing notes.   HISTORY  Chief Complaint Shortness of Breath   History limited by: Not Limited   HPI Austin Patterson is a 64 y.o. male who presents to the emergency department today because of concerns for shortness of breath.  Patient has a history of chronic lung disease and is on 3 L of oxygen at baseline.  He states that he noticed increased short of breath this morning.  Says been accompanied by a discomfort in his chest which he states moves around.  He has not had any significant change in his cough.  The patient denies any fevers.  Patient has secondary complaint of back pain.  States he has been worked up for this by his primary care and has been told he has potentially fractures of his spine.  Records reviewed. Per medical record review patient has a history of COPD, pulmonary fibrosis, HTN, HLD.   Past Medical History:  Diagnosis Date  . COPD (chronic obstructive pulmonary disease) ([place])   . Hyperlipidemia   . Hypertension   . Pulmonary fibrosis ([place]) 11/2015    Patient Active Problem List   Diagnosis Date Noted  . Hyperlipidemia   . COPD exacerbation ([place])   . Bleeding nose   . Alcohol abuse   . RUQ abdominal pain   . Anaphylaxis 11/01/2019  . Hypotension 11/01/2019  . CAD (coronary artery disease) 11/01/2019  . H/O heart artery stent 11/01/2019  . Acute on chronic respiratory failure with hypoxia ([place])   . RUQ pain   . Essential hypertension   . IPF (idiopathic pulmonary fibrosis) ([place]) 08/30/2019  . Hypomagnesemia 03/31/2017  . Hiatal hernia   . Reflux esophagitis   . Hematemesis without nausea   . Hyponatremia 09/21/2016  . Multifocal pneumonia 12/09/2015    Past Surgical History:  Procedure Laterality Date  . CORONARY STENT PLACEMENT    . ESOPHAGOGASTRODUODENOSCOPY (EGD) WITH  PROPOFOL N/A 09/23/2016   Procedure: ESOPHAGOGASTRODUODENOSCOPY (EGD) WITH PROPOFOL;  Surgeon: [name], MD;  Location: [facility] ENDOSCOPY;  Service: Endoscopy;  Laterality: N/A;  . SHOULDER ACROMIOPLASTY      Prior to Admission medications   Medication Sig Start Date End Date Taking? Authorizing Provider  albuterol (VENTOLIN HFA) 108 (90 Base) MCG/ACT inhaler Inhale 2 puffs into the lungs every 6 (six) hours as needed for wheezing or shortness of breath. 01/29/20   Danford, Suann Larry, MD  amLODipine (NORVASC) 10 MG tablet Take 1 tablet (10 mg total) by mouth daily. 08/19/16   Tawni Millers, MD  aspirin EC 81 MG tablet Take 1 tablet (81 mg total) by mouth daily. 08/19/16   Tawni Millers, MD  atorvastatin (LIPITOR) 40 MG tablet Take 1 tablet (40 mg total) by mouth at bedtime. 08/19/16   Tawni Millers, MD  calcium-vitamin D (OSCAL WITH D) 500-200 MG-UNIT tablet Take 2 tablets by mouth daily with breakfast. Patient taking differently: Take 2 tablets by mouth daily in the afternoon.  09/24/16   Fritzi Mandes, MD  cetirizine (ZYRTEC) 10 MG tablet Take 10 mg by mouth at bedtime. 08/19/16   [provider]  clopidogrel (PLAVIX) 75 MG tablet Take 1 tablet (75 mg total) by mouth daily. 08/19/16   Tawni Millers, MD  Dupilumab (DUPIXENT) 300 MG/2ML SOPN Inject 2 mLs into the skin every 14 (fourteen) days. Patient not taking: Reported on 02/12/2020 12/05/19  Tyler Pita, MD  EPINEPHrine 0.3 mg/0.3 mL IJ SOAJ injection Inject 0.3 mg into the muscle as needed for anaphylaxis.     [provider]  Fluticasone-Salmeterol (ADVAIR DISKUS) 250-50 MCG/DOSE AEPB Inhale 1 puff into the lungs 2 (two) times daily. 10/31/19 10/30/20  Tyler Pita, MD  ipratropium-albuterol (DUONEB) 0.5-2.5 (3) MG/3ML SOLN Take 3 mLs by nebulization 4 (four) times daily as needed (wheezing/shortness of breath). 01/29/20   Danford, Suann Larry, MD  losartan-hydrochlorothiazide (HYZAAR) 100-12.5 MG tablet Take 1  tablet by mouth daily. 11/05/19   [provider]  magnesium oxide (MAG-OX) 400 (241.3 Mg) MG tablet Take 1 tablet (400 mg total) by mouth daily. Patient taking differently: Take 400 mg by mouth daily in the afternoon.  06/18/17   Hillary Bow, MD  metoprolol succinate (TOPROL-XL) 50 MG 24 hr tablet Take 1 tablet (50 mg total) by mouth daily. 08/19/16   Tawni Millers, MD  Multiple Vitamin (MULTIVITAMIN WITH MINERALS) TABS tablet Take 1 tablet by mouth daily. Patient taking differently: Take 1 tablet by mouth daily in the afternoon.  09/24/16   Fritzi Mandes, MD  oxyCODONE-acetaminophen (PERCOCET/ROXICET) 5-325 MG tablet Take 1 tablet by mouth every 4 (four) hours as needed for severe pain. 02/09/20   Paulette Blanch, MD  pantoprazole (PROTONIX) 40 MG tablet Take 1 tablet (40 mg total) by mouth 2 (two) times daily. 09/23/16   Fritzi Mandes, MD  potassium chloride 20 MEQ TBCR Take 20 mEq by mouth daily. Patient taking differently: Take 20 mEq by mouth daily in the afternoon.  06/18/17   Hillary Bow, MD  predniSONE (DELTASONE) 10 MG tablet Take prednisone 40 mg (4 tabs) for 3 days then take prednisone 30 mg (3 tabs) for 3 days then take prednisone 20 mg (2 tabs) for 3 days then resume your home 10 mg daily and ask Dr. Patsey Berthold to refill 01/29/20   Danford, Suann Larry, MD  Spacer/Aero-Holding Chambers (AEROCHAMBER MV) inhaler Use as instructed 10/05/19   Tyler Pita, MD  tizanidine (ZANAFLEX) 2 MG capsule Take 1 capsule (2 mg total) by mouth 3 (three) times daily as needed for muscle spasms. 02/12/20   Tyler Pita, MD    Allergies Amoxicillin  Family History  Problem Relation Age of Onset  . Heart disease Mother     Social History Social History   Tobacco Use  . Smoking status: Former Smoker    Packs/day: 2.00    Years: 50.00    Pack years: 100.00    Types: Cigarettes    Quit date: 10/14/2015    Years since quitting: 4.3  . Smokeless tobacco: Former Network engineer Use  Topics  . Alcohol use: Yes    Alcohol/week: 28.0 standard drinks    Types: 28 Cans of beer per week    Comment: 6 12 oz cans of beer/day  . Drug use: No    Review of Systems Constitutional: No fever/chills Eyes: No visual changes. ENT: No sore throat. Cardiovascular: Denies chest pain. Respiratory: Positive shortness of breath. Gastrointestinal: No abdominal pain.  No nausea, no vomiting.  No diarrhea.   Genitourinary: Negative for dysuria. Musculoskeletal: Positive for back pain. Skin: Negative for rash. Neurological: Negative for headaches, focal weakness or numbness.  ____________________________________________   PHYSICAL EXAM:  VITAL SIGNS: ED Triage Vitals  Enc Vitals Group     BP --      Pulse Rate 02/16/20 1001 86     Resp 02/16/20 1001 20  Temp 02/16/20 1001 98.1 F (36.7 C)     Temp Source 02/16/20 1001 Oral     SpO2 02/16/20 1001 97 %     Weight 02/16/20 0957 210 lb (95.3 kg)     Height 02/16/20 0957 5\' 7"  (1.702 m)     Head Circumference --      Peak Flow --      Pain Score 02/16/20 0957 10   Constitutional: Alert and oriented.  Eyes: Conjunctivae are normal.  ENT      Head: Normocephalic and atraumatic.      Nose: No congestion/rhinnorhea.      Mouth/Throat: Mucous membranes are moist.      Neck: No stridor. Hematological/Lymphatic/Immunilogical: No cervical lymphadenopathy. Cardiovascular: Normal rate, regular rhythm.  No murmurs, rubs, or gallops.  Respiratory: Slightly increased work of breathing. No wheezing or crackles appreciated.  Gastrointestinal: Soft and non tender. No rebound. No guarding.  Genitourinary: Deferred Musculoskeletal: Normal range of motion in all extremities. No lower extremity edema. Neurologic:  Normal speech and language. No gross focal neurologic deficits are appreciated.  Skin:  Skin is warm, dry and intact. No rash noted. Psychiatric: Mood and affect are normal. Speech and behavior are normal. Patient exhibits  appropriate insight and judgment.  ____________________________________________    LABS (pertinent positives/negatives)  Trop hs 4 CBC wbc 6.0, hgb 11.7, plt 262 BMP na 120, k 3.4, cl 79, glu 115, cr 0.63  ____________________________________________   EKG  None  ____________________________________________    RADIOLOGY  CXR Prominent bullous changes  ____________________________________________   PROCEDURES  Procedures  ____________________________________________   INITIAL IMPRESSION / ASSESSMENT AND PLAN / ED COURSE  Pertinent labs & imaging results that were available during my care of the patient were reviewed by me and considered in my medical decision making (see chart for details).   Patient presented to the emergency department today because of concerns for shortness of breath.  Patient has a history of COPD and pulmonary fibrosis.  On exam patient did have slightly increased work of breathing.  Patient was given Solu-Medrol as well as DuoNeb treatments and stated that his breathing felt improved.  However blood work did reveal hyponatremia.  It does appear patient tends to run on the low side although today is lower than his most recent values.  I discussed this with the patient.  Do think he would benefit from hospitalization for electrolyte repletion.  ____________________________________________   FINAL CLINICAL IMPRESSION(S) / ED DIAGNOSES  Final diagnoses:  Hyponatremia     Note: This dictation was prepared with Dragon dictation. Any transcriptional errors that result from this process are unintentional     Nance Pear, MD 02/16/20 1420

## 2020-02-16 NOTE — Plan of Care (Signed)

## 2020-02-16 NOTE — ED Notes (Signed)
Attempted to call report at this time, per unit the charge RN is deciding who will get pt. Will call back per unit request.

## 2020-02-17 DIAGNOSIS — E871 Hypo-osmolality and hyponatremia: Secondary | ICD-10-CM | POA: Diagnosis not present

## 2020-02-17 LAB — BASIC METABOLIC PANEL
Anion gap: 11 (ref 5–15)
BUN: 12 mg/dL (ref 8–23)
CO2: 30 mmol/L (ref 22–32)
Calcium: 9 mg/dL (ref 8.9–10.3)
Chloride: 88 mmol/L — ABNORMAL LOW (ref 98–111)
Creatinine, Ser: 0.65 mg/dL (ref 0.61–1.24)
GFR calc Af Amer: >60 mL/min (ref >60)
GFR calc non Af Amer: >60 mL/min (ref >60)
Glucose, Bld: 148 mg/dL — ABNORMAL HIGH (ref 70–99)
Potassium: 3.4 mmol/L — ABNORMAL LOW (ref 3.5–5.1)
Sodium: 129 mmol/L — ABNORMAL LOW (ref 135–145)

## 2020-02-17 LAB — CBC
HCT: 33.5 % — ABNORMAL LOW (ref 39.0–52.0)
Hemoglobin: 11.5 g/dL — ABNORMAL LOW (ref 13.0–17.0)
MCH: 32.4 pg (ref 26.0–34.0)
MCHC: 34.3 g/dL (ref 30.0–36.0)
MCV: 94.4 fL (ref 80.0–100.0)
Platelets: 258 10*3/uL (ref 150–400)
RBC: 3.55 MIL/uL — ABNORMAL LOW (ref 4.22–5.81)
RDW: 13.7 % (ref 11.5–15.5)
WBC: 6.2 10*3/uL (ref 4.0–10.5)
nRBC: 0 % (ref 0.0–0.2)

## 2020-02-17 LAB — MAGNESIUM: Magnesium: 1.7 mg/dL (ref 1.7–2.4)

## 2020-02-17 MED ORDER — POTASSIUM CHLORIDE CRYS ER 20 MEQ PO TBCR
40.0000 meq | EXTENDED_RELEASE_TABLET | Freq: Once | ORAL | Status: AC
  Administered 2020-02-17: 10:00:00 40 meq via ORAL
  Filled 2020-02-17: qty 2

## 2020-02-17 NOTE — Discharge Summary (Signed)
Physician Discharge Summary   Austin Patterson  male DOB: 15-Feb-1956  [id number]  PCP: Laneta Simmers, NP  Admit date: 02/16/2020 Discharge date: 02/17/2020  Admitted From: home Disposition:  home  CODE STATUS: Full code  Discharge Instructions    Ambulatory referral to Endocrinology   Complete by: As directed    Diet - low sodium heart healthy   Complete by: As directed    Discharge instructions   Complete by: As directed    Your sodium is chronically low, most likely because you have too much free water in your body, due to some hormone imbalance.  Please try to limit the amount of water you drink.  You have small masses in your adrenal glands.  For this reason and your low sodium issue, please see endocrinology as outpatient.  I have put in a referral for you.     Dr. Enzo Bi - -   Increase activity slowly   Complete by: As directed        Hospital Course:  For full details, please see H&P, progress notes, consult notes and ancillary notes.  Briefly,  Austin Patterson is a 64 y.o. Caucasian male with medical history significant of severe bullous emphysema, pulmonary fibrosis, chronic hypoxic respiratory failure on 3L at baseline, chronic hyponatremia, HTN, CAD who presented with worsening dyspnea and chronic back pain.  # Acute on chronic hyponatremia # Possible SIADH Presented with Na 120.  Has had hyponatremia since 2013 since available records.  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  Pt also admitted to drinking a lot of free water, which could also contribute to hyponatremia.  Ordered fluid restriction to 1200 cc per day.  Na improved to 129 the next day.  Pt was advised to refrain from drinking too much free water, and see endocrin as outpatient.  (Referal ordered, info for endocrin provider provided in AVS).  # Chronic hypoxic respiratory failure on 3L at home # Severe bullous emphysema # Pulmonary fibrosis Stable on 3L home O2 on  presentation.  Recently treated for COPD exacerbation.  Follows with Dr. Patsey Berthold outpatient.  Currently on self-planned prednisone taper, 10 mg every other day.  Pt was continued on home bronchodilator regimen.  # Back pain CTA chest on 02/09/20 showed "Stable appearing mild lower thoracic compression deformities." Continued home tizanidine PRN.  # HTN continued home amlodipine, losartan-HCTZ, and Toprol  # CAD continued home ASA, plavix, and statin  # Bilateral adrenal gland adenomas First mentioned on CT chest on 01/28/20.  Referral for outpatient endocrin followup.   Discharge Diagnoses:  Active Problems:   Hyponatremia    Discharge Instructions:  Allergies as of 02/17/2020      Reactions   Amoxicillin Anaphylaxis      Medication List    STOP taking these medications   oxyCODONE-acetaminophen 5-325 MG tablet Commonly known as: PERCOCET/ROXICET     TAKE these medications   AeroChamber MV inhaler Use as instructed   albuterol 108 (90 Base) MCG/ACT inhaler Commonly known as: VENTOLIN HFA Inhale 2 puffs into the lungs every 6 (six) hours as needed for wheezing or shortness of breath.   amLODipine 10 MG tablet Commonly known as: NORVASC Take 1 tablet (10 mg total) by mouth daily.   aspirin EC 81 MG tablet Take 1 tablet (81 mg total) by mouth daily.   atorvastatin 40 MG tablet Commonly known as: LIPITOR Take 1 tablet (40 mg total) by mouth at bedtime.   calcium-vitamin D 500-200  MG-UNIT tablet Commonly known as: OSCAL WITH D Take 2 tablets by mouth daily with breakfast. What changed: when to take this   cetirizine 10 MG tablet Commonly known as: ZYRTEC Take 10 mg by mouth at bedtime.   clopidogrel 75 MG tablet Commonly known as: PLAVIX Take 1 tablet (75 mg total) by mouth daily.   Dupixent 300 MG/2ML Sopn Generic drug: Dupilumab Inject 2 mLs into the skin every 14 (fourteen) days.   EPINEPHrine 0.3 mg/0.3 mL Soaj injection Commonly known as:  EPI-PEN Inject 0.3 mg into the muscle as needed for anaphylaxis.   Fluticasone-Salmeterol 250-50 MCG/DOSE Aepb Commonly known as: Advair Diskus Inhale 1 puff into the lungs 2 (two) times daily.   ipratropium-albuterol 0.5-2.5 (3) MG/3ML Soln Commonly known as: DUONEB Take 3 mLs by nebulization 4 (four) times daily as needed (wheezing/shortness of breath).   losartan-hydrochlorothiazide 100-12.5 MG tablet Commonly known as: HYZAAR Take 1 tablet by mouth daily.   magnesium oxide 400 (241.3 Mg) MG tablet Commonly known as: MAG-OX Take 1 tablet (400 mg total) by mouth daily. What changed: when to take this   metoprolol succinate 50 MG 24 hr tablet Commonly known as: TOPROL-XL Take 1 tablet (50 mg total) by mouth daily.   multivitamin with minerals Tabs tablet Take 1 tablet by mouth daily. What changed: when to take this   pantoprazole 40 MG tablet Commonly known as: PROTONIX Take 1 tablet (40 mg total) by mouth 2 (two) times daily.   Potassium Chloride ER 20 MEQ Tbcr Take 20 mEq by mouth daily. What changed: when to take this   predniSONE 10 MG tablet Commonly known as: DELTASONE Take prednisone 40 mg (4 tabs) for 3 days then take prednisone 30 mg (3 tabs) for 3 days then take prednisone 20 mg (2 tabs) for 3 days then resume your home 10 mg daily and ask Dr. Patsey Berthold to refill What changed:   how much to take  how to take this  when to take this   tizanidine 2 MG capsule Commonly known as: ZANAFLEX Take 1 capsule (2 mg total) by mouth 3 (three) times daily as needed for muscle spasms.       Follow-up Information    Laneta Simmers, NP. Schedule an appointment as soon as possible for a visit in 1 week(s).   Specialty: Nurse Practitioner Contact information: [place] [address] [phone]        Lonia Farber, MD. Schedule an appointment as soon as possible for a visit in 2 week(s).   Specialties: Internal Medicine, Endocrinology  Why: follow up for adrenal masses and possible SIADH Contact information: [address] [place] [address] [phone]           Allergies  Allergen Reactions  . Amoxicillin Anaphylaxis     The results of significant diagnostics from this hospitalization (including imaging, microbiology, ancillary and laboratory) are listed below for reference.   Consultations:   Procedures/Studies: DG Chest 2 View  Result Date: 02/09/2020 CLINICAL DATA:  Shortness of breath, back pain EXAM: CHEST - 2 VIEW COMPARISON:  01/24/2020 FINDINGS: Large bulla in the right lower lung as seen on prior CT with adjacent atelectasis or scarring. Right basilar atelectasis or scarring. Left lung clear. Heart is normal size. No effusions or acute bony abnormality. IMPRESSION: Large air-filled bulla in the right lower lung with adjacent atelectasis or scarring. No active disease. Electronically Signed   By: Rolm Baptise M.D.   On: 02/09/2020 01:36  CT CHEST WO CONTRAST  Result Date: 01/28/2020 CLINICAL DATA:  Respiratory distress. EXAM: CT CHEST WITHOUT CONTRAST TECHNIQUE: Multidetector CT imaging of the chest was performed following the standard protocol without IV contrast. COMPARISON:  January 29, 2016 FINDINGS: Cardiovascular: There is marked severity calcification of the thoracic aorta. Normal heart size. No pericardial effusion. Marked severity coronary artery calcification is seen. Mediastinum/Nodes: There is mild pretracheal lymphadenopathy. Lungs/Pleura: There is marked severity emphysematous lung disease with extensive bullous changes seen along lateral aspect of the right upper lobe. There is no evidence of acute infiltrate, pleural effusion or pneumothorax. Upper Abdomen: A small hiatal hernia is seen. There is a stable 1.8 cm x 1.7 cm low-attenuation right adrenal mass. An additional stable 1.8 cm x 1.3 cm low-attenuation left adrenal mass is noted. Musculoskeletal: A chronic appearing deformity  is seen along the anterior aspect of the fourth left rib. Degenerative changes seen throughout the thoracic spine. IMPRESSION: 1. Marked severity emphysematous lung disease with extensive bullous changes seen along the lateral aspect of the right upper lobe. 2. Stable bilateral low-attenuation adrenal masses which likely represent adrenal adenomas. 3. Small hiatal hernia. Aortic Atherosclerosis (ICD10-I70.0) and Emphysema (ICD10-J43.9). Electronically Signed   By: Virgina Norfolk M.D.   On: 01/28/2020 20:40   CT Angio Chest PE W/Cm &/Or Wo Cm  Result Date: 02/09/2020 CLINICAL DATA:  Chest and back pain. EXAM: CT ANGIOGRAPHY CHEST WITH CONTRAST TECHNIQUE: Multidetector CT imaging of the chest was performed using the standard protocol during bolus administration of intravenous contrast. Multiplanar CT image reconstructions and MIPs were obtained to evaluate the vascular anatomy. CONTRAST:  125mL OMNIPAQUE IOHEXOL 350 MG/ML SOLN COMPARISON:  01/28/2020 FINDINGS: Cardiovascular: The heart is normal in size. No pericardial effusion. There is moderate tortuosity, mild ectasia and age advanced atherosclerotic calcification involving the aorta and branch vessels including extensive three-vessel coronary artery calcifications. No aortic dissection or focal aneurysm. The pulmonary arterial tree is fairly well opacified. No filling defects to suggest pulmonary embolism. Mediastinum/Nodes: Stable scattered mediastinal and hilar lymph nodes. No mass or overt adenopathy. The esophagus is grossly normal. Lungs/Pleura: Severe emphysematous changes and pulmonary scarring. No acute overlying pulmonary process is identified. Large right middle lobe bulla is noted with adjacent compressive atelectasis. No worrisome pulmonary lesions. No pleural effusion. Upper Abdomen: No significant upper abdominal findings. Stable bilateral adrenal gland adenomas. Stable abdominal aortic calcifications. Musculoskeletal: No chest wall mass,  supraclavicular or axillary adenopathy. The bony thorax is intact. Stable appearing mild lower thoracic compression deformities. Review of the MIP images confirms the above findings. IMPRESSION: 1. No CT findings for pulmonary embolism. 2. Stable tortuosity, mild ectasia and age advanced atherosclerotic calcification involving the aorta and branch vessels including extensive three-vessel coronary artery calcifications. 3. Severe emphysematous changes and pulmonary scarring. No acute overlying pulmonary process. 4. Stable bilateral adrenal gland adenomas. 5. Emphysema and aortic atherosclerosis. Aortic Atherosclerosis (ICD10-I70.0) and Emphysema (ICD10-J43.9). Aortic Atherosclerosis (ICD10-I70.0) and Emphysema (ICD10-J43.9). Electronically Signed   By: Marijo Sanes M.D.   On: 02/09/2020 06:30   DG Chest Portable 1 View  Result Date: 02/16/2020 CLINICAL DATA:  Shortness of breath. EXAM: PORTABLE CHEST 1 VIEW COMPARISON:  02/09/2020.  CT 02/09/2020, 01/28/2020 FINDINGS: Mediastinum and hilar structures normal. Heart size normal. Prominent bullous changes again noted over the right lung. Bibasilar atelectasis/scarring again noted. Mild bibasilar infiltrates cannot be excluded. No pleural effusion or pneumothorax. Heart size normal. IMPRESSION: 1.  Prominent bullous changes again noted over the right lung. 2. Bibasilar atelectasis/scarring again noted. Mild  bibasilar infiltrates cannot be excluded. Electronically Signed   By: Marcello Moores  Register   On: 02/16/2020 10:32   DG Chest Port 1 View  Result Date: 01/24/2020 CLINICAL DATA:  Dyspnea EXAM: PORTABLE CHEST 1 VIEW COMPARISON:  11/01/2019 FINDINGS: Chronic lung disease with emphysema and fibrotic appearance. Increased markings at the bases which could be bronchitis or just poor inspiration. No edema, effusion, or pneumothorax. Normal heart size and stable mediastinal contours. IMPRESSION: Chronic lung disease with fibrosis by remote chest CT. Interstitial  coarsening is more prominent than on prior and this could be from lower lung volumes or acute interstitial disease. Electronically Signed   By: Monte Fantasia M.D.   On: 01/24/2020 09:32   US ABDOMEN LIMITED RUQ  Result Date: 01/24/2020 CLINICAL DATA:  Right upper quadrant abdominal pain EXAM: ULTRASOUND ABDOMEN LIMITED RIGHT UPPER QUADRANT COMPARISON:  None. FINDINGS: Gallbladder: No gallstones or wall thickening visualized. No sonographic Murphy sign noted by sonographer. Common bile duct: Diameter: Liver: Increased echotexture seen throughout. No focal abnormality or biliary ductal dilatation. Portal vein is patent on color Doppler imaging with normal direction of blood flow towards the liver. Other: None. IMPRESSION: Normal gallbladder. Hepatic steatosis Electronically Signed   By: Prudencio Pair M.D.   On: 01/24/2020 13:30      Labs: BNP (last 3 results) Recent Labs    08/30/19 0910 01/24/20 0909  BNP 26.0 [id number]   Basic Metabolic Panel: Recent Labs  Lab 02/16/20 1004 02/17/20 0438  NA 120* 129*  K 3.4* 3.4*  CL 79* 88*  CO2 27 30  GLUCOSE 115* 148*  BUN 6* 12  CREATININE 0.63 0.65  CALCIUM 9.0 9.0  MG  --  1.7   Liver Function Tests: No results for input(s): AST, ALT, ALKPHOS, BILITOT, PROT, ALBUMIN in the last 168 hours. No results for input(s): LIPASE, AMYLASE in the last 168 hours. No results for input(s): AMMONIA in the last 168 hours. CBC: Recent Labs  Lab 02/16/20 1004 02/17/20 0438  WBC 6.0 6.2  NEUTROABS 4.3  --   HGB 11.7* 11.5*  HCT 33.6* 33.5*  MCV 94.4 94.4  PLT 262 258   Cardiac Enzymes: No results for input(s): CKTOTAL, CKMB, CKMBINDEX, TROPONINI in the last 168 hours. BNP: Invalid input(s): POCBNP CBG: No results for input(s): GLUCAP in the last 168 hours. D-Dimer No results for input(s): DDIMER in the last 72 hours. Hgb A1c No results for input(s): HGBA1C in the last 72 hours. Lipid Profile No results for input(s): CHOL, HDL, LDLCALC, TRIG,  CHOLHDL, LDLDIRECT in the last 72 hours. Thyroid function studies No results for input(s): TSH, T4TOTAL, T3FREE, THYROIDAB in the last 72 hours.  Invalid input(s): FREET3 Anemia work up No results for input(s): VITAMINB12, FOLATE, FERRITIN, TIBC, IRON, RETICCTPCT in the last 72 hours. Urinalysis    Component Value Date/Time   COLORURINE STRAW (A) 02/09/2020 0451   APPEARANCEUR CLEAR (A) 02/09/2020 0451   LABSPEC 1.011 02/09/2020 0451   PHURINE 5.0 02/09/2020 0451   GLUCOSEU NEGATIVE 02/09/2020 0451   HGBUR NEGATIVE 02/09/2020 0451   BILIRUBINUR NEGATIVE 02/09/2020 0451   KETONESUR NEGATIVE 02/09/2020 0451   PROTEINUR NEGATIVE 02/09/2020 0451   NITRITE NEGATIVE 02/09/2020 0451   LEUKOCYTESUR NEGATIVE 02/09/2020 0451   Sepsis Labs Invalid input(s): PROCALCITONIN,  WBC,  LACTICIDVEN Microbiology Recent Results (from the past 240 hour(s))  Respiratory Panel by RT PCR (Flu A&B, Covid) - Nasopharyngeal Swab     Status: None   Collection Time: 02/16/20  1:33 PM  Specimen: Nasopharyngeal Swab  Result Value Ref Range Status   SARS Coronavirus 2 by RT PCR NEGATIVE NEGATIVE Final    Comment: (NOTE) SARS-CoV-2 target nucleic acids are NOT DETECTED. The SARS-CoV-2 RNA is generally detectable in upper respiratoy specimens during the acute phase of infection. The lowest concentration of SARS-CoV-2 viral copies this assay can detect is 131 copies/mL. A negative result does not preclude SARS-Cov-2 infection and should not be used as the sole basis for treatment or other patient management decisions. A negative result may occur with  improper specimen collection/handling, submission of specimen other than nasopharyngeal swab, presence of viral mutation(s) within the areas targeted by this assay, and inadequate number of viral copies (<131 copies/mL). A negative result must be combined with clinical observations, patient history, and epidemiological information. The expected result is  Negative. Fact Sheet for Patients:  PinkCheek.be Fact Sheet for Healthcare Providers:  GravelBags.it This test is not yet ap proved or cleared by the [place] FDA and  has been authorized for detection and/or diagnosis of SARS-CoV-2 by FDA under an Emergency Use Authorization (EUA). This EUA will remain  in effect (meaning this test can be used) for the duration of the COVID-19 declaration under Section 564(b)(1) of the Act, 21 U.S.C. section 360bbb-3(b)(1), unless the authorization is terminated or revoked sooner.    Influenza A by PCR NEGATIVE NEGATIVE Final   Influenza B by PCR NEGATIVE NEGATIVE Final    Comment: (NOTE) The Xpert Xpress SARS-CoV-2/FLU/RSV assay is intended as an aid in  the diagnosis of influenza from Nasopharyngeal swab specimens and  should not be used as a sole basis for treatment. Nasal washings and  aspirates are unacceptable for Xpert Xpress SARS-CoV-2/FLU/RSV  testing. Fact Sheet for Patients: PinkCheek.be Fact Sheet for Healthcare Providers: GravelBags.it This test is not yet approved or cleared by the [place] FDA and  has been authorized for detection and/or diagnosis of SARS-CoV-2 by  FDA under an Emergency Use Authorization (EUA). This EUA will remain  in effect (meaning this test can be used) for the duration of the  Covid-19 declaration under Section 564(b)(1) of the Act, 21  U.S.C. section 360bbb-3(b)(1), unless the authorization is  terminated or revoked. Performed at [facility], [address] [place], [place] [address]      Total time spend on discharging this patient, including the last patient exam, discussing the hospital stay, instructions for ongoing care as it relates to all pertinent caregivers, as well as preparing the medical discharge records, prescriptions, and/or referrals as applicable, is 40  minutes.    Enzo Bi, MD  [facility] 02/17/2020, 8:56 AM  If 7PM-7AM, please contact night-coverage

## 2020-02-19 ENCOUNTER — Other Ambulatory Visit: Payer: Self-pay

## 2020-02-19 ENCOUNTER — Emergency Department: Payer: Medicare HMO

## 2020-02-19 ENCOUNTER — Emergency Department
Admission: EM | Admit: 2020-02-19 | Discharge: 2020-02-19 | Disposition: A | Discharge status: Home / Self Care | Payer: Medicare HMO | Attending: Emergency Medicine | Admitting: Emergency Medicine

## 2020-02-19 ENCOUNTER — Encounter: Payer: Self-pay | Admitting: Emergency Medicine

## 2020-02-19 DIAGNOSIS — J441 Chronic obstructive pulmonary disease with (acute) exacerbation: Secondary | ICD-10-CM | POA: Diagnosis not present

## 2020-02-19 DIAGNOSIS — M546 Pain in thoracic spine: Secondary | ICD-10-CM | POA: Diagnosis not present

## 2020-02-19 DIAGNOSIS — Z955 Presence of coronary angioplasty implant and graft: Secondary | ICD-10-CM | POA: Diagnosis not present

## 2020-02-19 DIAGNOSIS — I10 Essential (primary) hypertension: Secondary | ICD-10-CM | POA: Diagnosis not present

## 2020-02-19 DIAGNOSIS — R531 Weakness: Secondary | ICD-10-CM | POA: Diagnosis not present

## 2020-02-19 DIAGNOSIS — Z79899 Other long term (current) drug therapy: Secondary | ICD-10-CM | POA: Insufficient documentation

## 2020-02-19 DIAGNOSIS — E871 Hypo-osmolality and hyponatremia: Secondary | ICD-10-CM | POA: Diagnosis not present

## 2020-02-19 DIAGNOSIS — Z87891 Personal history of nicotine dependence: Secondary | ICD-10-CM | POA: Insufficient documentation

## 2020-02-19 DIAGNOSIS — R0602 Shortness of breath: Secondary | ICD-10-CM | POA: Diagnosis present

## 2020-02-19 LAB — CBC WITH DIFFERENTIAL/PLATELET
Abs Immature Granulocytes: 0.04 10*3/uL (ref 0.00–0.07)
Basophils Absolute: 0.1 10*3/uL (ref 0.0–0.1)
Basophils Relative: 1 %
Eosinophils Absolute: 0.1 10*3/uL (ref 0.0–0.5)
Eosinophils Relative: 2 %
HCT: 37.7 % — ABNORMAL LOW (ref 39.0–52.0)
Hemoglobin: 12.4 g/dL — ABNORMAL LOW (ref 13.0–17.0)
Immature Granulocytes: 1 %
Lymphocytes Relative: 22 %
Lymphs Abs: 1.8 10*3/uL (ref 0.7–4.0)
MCH: 33 pg (ref 26.0–34.0)
MCHC: 32.9 g/dL (ref 30.0–36.0)
MCV: 100.3 fL — ABNORMAL HIGH (ref 80.0–100.0)
Monocytes Absolute: 0.7 10*3/uL (ref 0.1–1.0)
Monocytes Relative: 9 %
Neutro Abs: 5.4 10*3/uL (ref 1.7–7.7)
Neutrophils Relative %: 65 %
Platelets: 291 10*3/uL (ref 150–400)
RBC: 3.76 MIL/uL — ABNORMAL LOW (ref 4.22–5.81)
RDW: 14.5 % (ref 11.5–15.5)
WBC: 8.2 10*3/uL (ref 4.0–10.5)
nRBC: 0 % (ref 0.0–0.2)

## 2020-02-19 LAB — BASIC METABOLIC PANEL
Anion gap: 10 (ref 5–15)
BUN: 11 mg/dL (ref 8–23)
CO2: 29 mmol/L (ref 22–32)
Calcium: 8.7 mg/dL — ABNORMAL LOW (ref 8.9–10.3)
Chloride: 84 mmol/L — ABNORMAL LOW (ref 98–111)
Creatinine, Ser: 0.68 mg/dL (ref 0.61–1.24)
GFR calc Af Amer: >60 mL/min (ref >60)
GFR calc non Af Amer: >60 mL/min (ref >60)
Glucose, Bld: 97 mg/dL (ref 70–99)
Potassium: 3.6 mmol/L (ref 3.5–5.1)
Sodium: 123 mmol/L — ABNORMAL LOW (ref 135–145)

## 2020-02-19 LAB — TROPONIN I (HIGH SENSITIVITY): Troponin I (High Sensitivity): 4 ng/L (ref <18)

## 2020-02-19 LAB — BRAIN NATRIURETIC PEPTIDE: B Natriuretic Peptide: 28 pg/mL (ref 0.0–100.0)

## 2020-02-19 MED ORDER — PREDNISONE 20 MG PO TABS: 60.0000 mg | ORAL_TABLET | Freq: Every day | ORAL | 0 refills | Status: AC

## 2020-02-19 MED ORDER — PREDNISONE 20 MG PO TABS
60.0000 mg | ORAL_TABLET | Freq: Once | ORAL | Status: AC
  Administered 2020-02-19: 60 mg via ORAL
  Filled 2020-02-19: qty 3

## 2020-02-19 MED ORDER — OXYCODONE-ACETAMINOPHEN 5-325 MG PO TABS
1.0000 | ORAL_TABLET | Freq: Once | ORAL | Status: AC
  Administered 2020-02-19: 05:00:00 1 via ORAL
  Filled 2020-02-19: qty 1

## 2020-02-19 MED ORDER — ALBUTEROL SULFATE (2.5 MG/3ML) 0.083% IN NEBU
2.5000 mg | INHALATION_SOLUTION | Freq: Once | RESPIRATORY_TRACT | Status: AC
  Administered 2020-02-19: 2.5 mg via RESPIRATORY_TRACT
  Filled 2020-02-19: qty 3

## 2020-02-19 MED ORDER — IPRATROPIUM-ALBUTEROL 0.5-2.5 (3) MG/3ML IN SOLN
9.0000 mL | Freq: Once | RESPIRATORY_TRACT | Status: AC
  Administered 2020-02-19: 9 mL via RESPIRATORY_TRACT
  Filled 2020-02-19: qty 3

## 2020-02-19 NOTE — ED Notes (Signed)
Dr. Charna Archer made aware of pt BP of 190/103. Pt has hx of HTN.

## 2020-02-19 NOTE — ED Triage Notes (Signed)
Pt arrives POV to triage with c/o SOB which has continued which he has been seen for the last two days. Pt wears 2-3 L chronically at this time and is 97% on the 3L. Pt is in NAD.

## 2020-02-19 NOTE — ED Provider Notes (Signed)
[facility] Emergency Department Provider Note   ____________________________________________   First MD Initiated Contact with Patient 02/19/20 0425     (approximate)  I have reviewed the triage vital signs and the nursing notes.   HISTORY  Chief Complaint Shortness of Breath    HPI Austin Patterson is a 64 y.o. male with possible history of COPD, pulmonary fibrosis on 2 L nasal cannula, hypertension, and hyperlipidemia who presents to the ED complaining of shortness of breath.  Patient reports that he left the hospital a couple of days ago due to low sodium levels, and has had increased difficulty breathing ever since then.  He deals with a chronic cough that is unchanged and denies any fevers or chest pain.  He has been taking breathing treatments at home without significant relief, denies being on any steroids currently.  He also states that he feels generally weak with ongoing pain in the middle of his back.  He states he was recently told that he has "fractures in my back" and he has continued to have that same pain.        Past Medical History:  Diagnosis Date  . COPD (chronic obstructive pulmonary disease) ([place])   . Hyperlipidemia   . Hypertension   . Pulmonary fibrosis ([place]) 11/2015    Patient Active Problem List   Diagnosis Date Noted  . Hyperlipidemia   . COPD exacerbation ([place])   . Bleeding nose   . Alcohol abuse   . RUQ abdominal pain   . Anaphylaxis 11/01/2019  . Hypotension 11/01/2019  . CAD (coronary artery disease) 11/01/2019  . H/O heart artery stent 11/01/2019  . Acute on chronic respiratory failure with hypoxia ([place])   . RUQ pain   . Essential hypertension   . IPF (idiopathic pulmonary fibrosis) ([place]) 08/30/2019  . Hypomagnesemia 03/31/2017  . Hiatal hernia   . Reflux esophagitis   . Hematemesis without nausea   . Hyponatremia 09/21/2016  . Multifocal pneumonia 12/09/2015    Past Surgical History:  Procedure  Laterality Date  . CORONARY STENT PLACEMENT    . ESOPHAGOGASTRODUODENOSCOPY (EGD) WITH PROPOFOL N/A 09/23/2016   Procedure: ESOPHAGOGASTRODUODENOSCOPY (EGD) WITH PROPOFOL;  Surgeon: [name], MD;  Location: [facility] ENDOSCOPY;  Service: Endoscopy;  Laterality: N/A;  . SHOULDER ACROMIOPLASTY      Prior to Admission medications   Medication Sig Start Date End Date Taking? Authorizing Provider  albuterol (VENTOLIN HFA) 108 (90 Base) MCG/ACT inhaler Inhale 2 puffs into the lungs every 6 (six) hours as needed for wheezing or shortness of breath. 01/29/20   Danford, Suann Larry, MD  amLODipine (NORVASC) 10 MG tablet Take 1 tablet (10 mg total) by mouth daily. 08/19/16   Tawni Millers, MD  aspirin EC 81 MG tablet Take 1 tablet (81 mg total) by mouth daily. 08/19/16   Tawni Millers, MD  atorvastatin (LIPITOR) 40 MG tablet Take 1 tablet (40 mg total) by mouth at bedtime. 08/19/16   Tawni Millers, MD  calcium-vitamin D (OSCAL WITH D) 500-200 MG-UNIT tablet Take 2 tablets by mouth daily with breakfast. Patient taking differently: Take 2 tablets by mouth daily in the afternoon.  09/24/16   Fritzi Mandes, MD  cetirizine (ZYRTEC) 10 MG tablet Take 10 mg by mouth at bedtime. 08/19/16   [provider]  clopidogrel (PLAVIX) 75 MG tablet Take 1 tablet (75 mg total) by mouth daily. 08/19/16   Tawni Millers, MD  Dupilumab (DUPIXENT) 300 MG/2ML SOPN Inject 2  mLs into the skin every 14 (fourteen) days. Patient not taking: Reported on 02/12/2020 12/05/19   Tyler Pita, MD  EPINEPHrine 0.3 mg/0.3 mL IJ SOAJ injection Inject 0.3 mg into the muscle as needed for anaphylaxis.     [provider]  Fluticasone-Salmeterol (ADVAIR DISKUS) 250-50 MCG/DOSE AEPB Inhale 1 puff into the lungs 2 (two) times daily. 10/31/19 10/30/20  Tyler Pita, MD  ipratropium-albuterol (DUONEB) 0.5-2.5 (3) MG/3ML SOLN Take 3 mLs by nebulization 4 (four) times daily as needed (wheezing/shortness of breath). 01/29/20    Danford, Suann Larry, MD  losartan-hydrochlorothiazide (HYZAAR) 100-12.5 MG tablet Take 1 tablet by mouth daily. 11/05/19   [provider]  magnesium oxide (MAG-OX) 400 (241.3 Mg) MG tablet Take 1 tablet (400 mg total) by mouth daily. Patient taking differently: Take 400 mg by mouth daily in the afternoon.  06/18/17   Hillary Bow, MD  metoprolol succinate (TOPROL-XL) 50 MG 24 hr tablet Take 1 tablet (50 mg total) by mouth daily. 08/19/16   Tawni Millers, MD  Multiple Vitamin (MULTIVITAMIN WITH MINERALS) TABS tablet Take 1 tablet by mouth daily. Patient taking differently: Take 1 tablet by mouth daily in the afternoon.  09/24/16   Fritzi Mandes, MD  pantoprazole (PROTONIX) 40 MG tablet Take 1 tablet (40 mg total) by mouth 2 (two) times daily. 09/23/16   Fritzi Mandes, MD  potassium chloride 20 MEQ TBCR Take 20 mEq by mouth daily. Patient taking differently: Take 20 mEq by mouth daily in the afternoon.  06/18/17   Hillary Bow, MD  predniSONE (DELTASONE) 20 MG tablet Take 3 tablets (60 mg total) by mouth daily for 5 days. 02/19/20 02/24/20  Blake Divine, MD  Spacer/Aero-Holding Chambers (AEROCHAMBER MV) inhaler Use as instructed 10/05/19   Tyler Pita, MD  tizanidine (ZANAFLEX) 2 MG capsule Take 1 capsule (2 mg total) by mouth 3 (three) times daily as needed for muscle spasms. 02/12/20   Tyler Pita, MD    Allergies Amoxicillin  Family History  Problem Relation Age of Onset  . Heart disease Mother     Social History Social History   Tobacco Use  . Smoking status: Former Smoker    Packs/day: 2.00    Years: 50.00    Pack years: 100.00    Types: Cigarettes    Quit date: 10/14/2015    Years since quitting: 4.3  . Smokeless tobacco: Former Network engineer Use Topics  . Alcohol use: Yes    Alcohol/week: 28.0 standard drinks    Types: 28 Cans of beer per week    Comment: 6 12 oz cans of beer/day  . Drug use: No    Review of Systems  Constitutional: No  fever/chills.  Positive for generalized weakness. Eyes: No visual changes. ENT: No sore throat. Cardiovascular: Denies chest pain. Respiratory: Positive for shortness of breath. Gastrointestinal: No abdominal pain.  No nausea, no vomiting.  No diarrhea.  No constipation. Genitourinary: Negative for dysuria. Musculoskeletal: Negative for back pain. Skin: Negative for rash. Neurological: Negative for headaches, focal weakness or numbness.  ____________________________________________   PHYSICAL EXAM:  VITAL SIGNS: ED Triage Vitals  Enc Vitals Group     BP 02/19/20 0010 139/75     Pulse Rate 02/19/20 0010 95     Resp 02/19/20 0010 (!) 22     Temp 02/19/20 0010 (!) 97.5 F (36.4 C)     Temp Source 02/19/20 0010 Oral     SpO2 02/19/20 0010 97 %  Weight 02/19/20 0013 210 lb 1.6 oz (95.3 kg)     Height 02/19/20 0013 5\' 7"  (1.702 m)     Head Circumference --      Peak Flow --      Pain Score 02/19/20 0013 8     Pain Loc --      Pain Edu? --      Excl. in [place]? --     Constitutional: Alert and oriented. Eyes: Conjunctivae are normal. Head: Atraumatic. Nose: No congestion/rhinnorhea. Mouth/Throat: Mucous membranes are moist. Neck: Normal ROM Cardiovascular: Normal rate, regular rhythm. Grossly normal heart sounds. Respiratory: Tachypneic with increased respiratory effort.  No retractions.  Poor air movement throughout, faint wheezing noted. Gastrointestinal: Soft and nontender. No distention. Genitourinary: deferred Musculoskeletal: No lower extremity tenderness nor edema. Neurologic:  Normal speech and language. No gross focal neurologic deficits are appreciated. Skin:  Skin is warm, dry and intact. No rash noted. Psychiatric: Mood and affect are normal. Speech and behavior are normal.  ____________________________________________   LABS (all labs ordered are listed, but only abnormal results are displayed)  Labs Reviewed  CBC WITH DIFFERENTIAL/PLATELET - Abnormal;  Notable for the following components:      Result Value   RBC 3.76 (*)    Hemoglobin 12.4 (*)    HCT 37.7 (*)    MCV 100.3 (*)    All other components within normal limits  BASIC METABOLIC PANEL - Abnormal; Notable for the following components:   Sodium 123 (*)    Chloride 84 (*)    Calcium 8.7 (*)    All other components within normal limits  BRAIN NATRIURETIC PEPTIDE  TROPONIN I (HIGH SENSITIVITY)   ____________________________________________  EKG  ED ECG REPORT I, Blake Divine, the attending physician, personally viewed and interpreted this ECG.   Date: 02/19/2020  EKG Time: 00:27  Rate: 85  Rhythm: normal sinus rhythm  Axis: Normal  Intervals:none  ST&T Change: None   PROCEDURES  Procedure(s) performed (including Critical Care):  Procedures   ____________________________________________   INITIAL IMPRESSION / ASSESSMENT AND PLAN / ED COURSE       65 year old male with possible history of COPD and pulmonary fibrosis on 2 L nasal cannula chronically, hypertension, hyperlipidemia, and hyponatremia presents to the ED for generalized weakness, ongoing back pain, and difficulty breathing.  He does appear to be having a COPD exacerbation given his poor air movement, tachypnea, and faint wheezing.  He is maintaining his O2 sats on his usual supplemental oxygen, we will give steroids and breathing treatments.  EKG shows no evidence of arrhythmia or ischemia, chest x-ray negative for acute process.  I have low suspicion for PE and he had negative CTA performed within the past 2 weeks.  Lab work is remarkable for hyponatremia that appears similar to prior, likely due to his ongoing alcohol abuse.  Back pain is chronic and he is neurovascularly intact to his bilateral lower extremities, no imaging of back indicated.  We will reevaluate his respiratory status following breathing treatments and steroids.  Patient now breathing much more comfortably, tachypnea has resolved and  wheezing much improved.  He was given 1 additional albuterol neb but now states his breathing feels almost back to normal.  I do not feel admission for his hyponatremia is indicated given it is chronic and he is asymptomatic from it.  At this point, he is appropriate for discharge home and we will start him on a course of steroids.  He was advised to follow-up closely with  his pulmonologist and PCP for recheck of sodium, otherwise return to the ED for new or worsening symptoms.      ____________________________________________   FINAL CLINICAL IMPRESSION(S) / ED DIAGNOSES  Final diagnoses:  COPD exacerbation ([place])  Chronic hyponatremia     ED Discharge Orders         Ordered    predniSONE (DELTASONE) 20 MG tablet  Daily     02/19/20 [id number]           Note:  This document was prepared using Dragon voice recognition software and may include unintentional dictation errors.   Blake Divine, MD 02/19/20 [phone]

## 2020-02-21 NOTE — Progress Notes (Signed)
Patient called and identification verified. Normal results of recent [facility] lab provided. Plan to continue with Dupixent. Patient verbalized understanding of results and ongoing plan of care.

## 2020-02-22 ENCOUNTER — Telehealth: Payer: Self-pay | Admitting: Adult Health

## 2020-02-22 ENCOUNTER — Ambulatory Visit: Payer: Medicare HMO

## 2020-02-22 NOTE — Telephone Encounter (Signed)
Called and spoke with Patient.  Patient stated his dupixent never arrived for home delivery as scheduled.  Patient stated he would call office to reschedule appointment once his Dupixent is delivered. Patient appointment cancelled.

## 2020-02-23 ENCOUNTER — Telehealth: Payer: Self-pay | Admitting: Pulmonary Disease

## 2020-02-25 ENCOUNTER — Ambulatory Visit
Admission: RE | Admit: 2020-02-25 | Discharge: 2020-02-25 | Disposition: A | Discharge status: Home / Self Care | Payer: Medicare HMO | Source: Ambulatory Visit | Attending: Pulmonary Disease | Admitting: Pulmonary Disease

## 2020-02-25 ENCOUNTER — Other Ambulatory Visit: Payer: Self-pay

## 2020-02-25 DIAGNOSIS — G8929 Other chronic pain: Secondary | ICD-10-CM | POA: Diagnosis present

## 2020-02-25 DIAGNOSIS — M546 Pain in thoracic spine: Secondary | ICD-10-CM

## 2020-02-25 MED ORDER — GADOBUTROL 1 MMOL/ML IV SOLN
10.0000 mL | Freq: Once | INTRAVENOUS | Status: AC | PRN
  Administered 2020-02-25: 10 mL via INTRAVENOUS

## 2020-02-26 NOTE — Telephone Encounter (Signed)
LMTCB

## 2020-02-27 NOTE — Telephone Encounter (Signed)
Austin Patterson is returning phone call. Vy phone number is [phone].

## 2020-02-27 NOTE — Telephone Encounter (Signed)
LMTCB

## 2020-02-28 ENCOUNTER — Other Ambulatory Visit: Payer: Self-pay | Admitting: Internal Medicine

## 2020-02-28 DIAGNOSIS — S22000A Wedge compression fracture of unspecified thoracic vertebra, initial encounter for closed fracture: Secondary | ICD-10-CM

## 2020-02-28 NOTE — Telephone Encounter (Signed)
LMTCB x 3 and will close per protocol 

## 2020-02-28 NOTE — Progress Notes (Signed)
I spoke with the pt and notified him of results and recs.. Referral was made.

## 2020-02-29 ENCOUNTER — Telehealth: Payer: Self-pay | Admitting: Pulmonary Disease

## 2020-03-01 ENCOUNTER — Encounter: Payer: Self-pay | Admitting: Pulmonary Disease

## 2020-03-07 NOTE — Telephone Encounter (Signed)
Returned [employer] Nurse's call, she is out of the office until 03/13/20.  Called patient to get an understanding of what was going on. Copay for [place] is now over $800 for 1 month supply. Patient has already called [employer] to voice need for patient assistance. Called [employer] to check status, rep Clarise Cruz advised that they have been waiting for copy of [employer] approval letter for [place] and then they can process patient for PAP program. Printed approval letter and faxed to [place]. Will follow up.  Phone# [phone]

## 2020-03-08 ENCOUNTER — Other Ambulatory Visit: Payer: Self-pay | Admitting: Orthopedic Surgery

## 2020-03-12 ENCOUNTER — Encounter: Payer: Self-pay | Admitting: Adult Health

## 2020-03-12 ENCOUNTER — Ambulatory Visit: Payer: Medicare HMO | Admitting: Adult Health

## 2020-03-12 ENCOUNTER — Other Ambulatory Visit
Admission: RE | Admit: 2020-03-12 | Discharge: 2020-03-12 | Disposition: A | Discharge status: Home / Self Care | Payer: Medicare HMO | Source: Ambulatory Visit | Attending: Orthopedic Surgery | Admitting: Orthopedic Surgery

## 2020-03-12 ENCOUNTER — Other Ambulatory Visit: Payer: Self-pay | Admitting: Adult Health

## 2020-03-12 ENCOUNTER — Other Ambulatory Visit: Payer: Self-pay

## 2020-03-12 VITALS — BP 126/82 | HR 96 | Temp 98.0°F | Ht 67.0 in | Wt 214.0 lb

## 2020-03-12 DIAGNOSIS — J9611 Chronic respiratory failure with hypoxia: Secondary | ICD-10-CM | POA: Insufficient documentation

## 2020-03-12 DIAGNOSIS — J4540 Moderate persistent asthma, uncomplicated: Secondary | ICD-10-CM | POA: Diagnosis not present

## 2020-03-12 DIAGNOSIS — J441 Chronic obstructive pulmonary disease with (acute) exacerbation: Secondary | ICD-10-CM | POA: Diagnosis not present

## 2020-03-12 DIAGNOSIS — Z20822 Contact with and (suspected) exposure to covid-19: Secondary | ICD-10-CM | POA: Diagnosis not present

## 2020-03-12 DIAGNOSIS — Z01812 Encounter for preprocedural laboratory examination: Secondary | ICD-10-CM | POA: Insufficient documentation

## 2020-03-12 DIAGNOSIS — J45909 Unspecified asthma, uncomplicated: Secondary | ICD-10-CM | POA: Insufficient documentation

## 2020-03-12 MED ORDER — PREDNISONE 5 MG PO TBEC: 1.0000 | DELAYED_RELEASE_TABLET | Freq: Every day | ORAL | 1 refills | Status: DC

## 2020-03-12 MED ORDER — PREDNISONE 10 MG PO TABS: 5.0000 mg | ORAL_TABLET | Freq: Every day | ORAL | 1 refills | Status: DC

## 2020-03-12 NOTE — Patient Instructions (Signed)
We will check on [employer] paperwork.  Continue on Advair Twice daily  , rinse after use.  Albuterol As needed   Activity as tolerated.  Continue on Oxygen 2l/m  Can look at [employer] Concentrator that gives continuous flow oxygen 2l/m .  Good luck with back procedure this week.  Continue on Prednisone 5mg  daily  Follow up with Dr. Patsey Berthold in 4-6 weeks and As needed   Please contact office for sooner follow up if symptoms do not improve or worsen or seek emergency care

## 2020-03-12 NOTE — Telephone Encounter (Signed)
Received a fax from  [employer] regarding an approval for [place] from 03/12/20 to 10/18/20.   Phone number: [phone]

## 2020-03-12 NOTE — Progress Notes (Signed)
@Patient  ID: Austin Patterson, male    DOB: 05-Jan-1956, 64 y.o.   MRN: [id number]  Chief Complaint  Patient presents with  . 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    IgE at 1,950    01/2020 Alpha 1 103   01/2020 CT chest -No CT findings for pulmonary embolism. Large right bulla  2. Stable tortuosity, mild ectasia and age advanced atherosclerotic calcification involving the aorta and branch vessels including extensive three-vessel coronary artery calcifications. 3. Severe emphysematous changes and pulmonary scarring. No acute overlying pulmonary process. 4. Stable bilateral adrenal gland adenomas. 5. Emphysema and aortic atherosclerosis.  03/12/2020 Follow up : COPD, asthma, alpha-1 MZ phenotype Patient returns for a 1 month follow-up.  Patient was seen last visit for his COPD.  He has severe asthma and emphysema as well.  Has previously been on [place] but insurance is not covering , pharmacy team is working on this to help with coverage.  His Dupixent has been approved and is awaiting him to get this set up.  He remains on Advair twice daily.  He denies any increased cough or congestion.  Remains on 2 L of oxygen.  He uses portable oxygen tanks.  Says he cannot tolerate pulsing oxygen so does not want a portable concentrator.  He remains on chronic prednisone since earlier this year.  Currently on 5 mg of prednisone. Alpha-1 levels were repeated last visit and were in the normal range at 103.  Patient has known MZ phenotype.  She has been dealing with chronic back pain due to compression fractures.  He is going for vertebroplasty later this week.  Is undergoing a work-up for  abnormality on CT with adrenal gland abnormalities.  Allergies  Allergen Reactions  . Amoxicillin Anaphylaxis  . Tizanidine     Feet and ankle swell     Immunization History  Administered Date(s) Administered  . Influenza Inj Mdck Quad Pf 08/10/2019  . Influenza,inj,Quad PF,6+ Mos 10/15/2015, 08/31/2016  . Moderna SARS-COVID-2 Vaccination 11/21/2019, 12/21/2019  . Pneumococcal Polysaccharide-23 10/15/2015    Past Medical History:  Diagnosis Date  . COPD (chronic obstructive pulmonary disease) ([place])   . Hyperlipidemia   . Hypertension   . Pulmonary fibrosis ([place]) 11/2015    Tobacco History: Social History   Tobacco Use  Smoking Status Former Smoker  . Packs/day: 2.00  . Years: 50.00  . Pack years: 100.00  . Types: Cigarettes  . Quit date: 10/14/2015  . Years since quitting: 4.4  Smokeless Tobacco Former Air traffic controller given: Not Answered   Outpatient Medications Prior to Visit  Medication Sig Dispense Refill  . acetaminophen (TYLENOL) 500 MG tablet Take 1,000 mg by mouth every 6 (six) hours as needed for moderate pain or headache.    . albuterol (VENTOLIN HFA) 108 (90 Base) MCG/ACT inhaler Inhale 2 puffs into the lungs every 6 (six) hours as needed for wheezing or shortness of breath. 8 g 11  . amLODipine (NORVASC) 10 MG tablet Take 1 tablet (10 mg total) by mouth daily. 90 tablet 3  . aspirin EC 81 MG tablet Take 1 tablet (81 mg total) by mouth daily. [place]  tablet 3  . atorvastatin (LIPITOR) 40 MG tablet Take 1 tablet (40 mg total) by mouth at bedtime. 90 tablet 3  . calcium-vitamin D (OSCAL WITH D) 500-200 MG-UNIT tablet Take 2 tablets by mouth daily with breakfast. 60 tablet 0  . clopidogrel (PLAVIX) 75 MG tablet Take 1 tablet (75 mg total) by mouth daily. 90 tablet 3  . Dupilumab (DUPIXENT) 300 MG/2ML SOPN Inject 2 mLs into the skin every 14 (fourteen) days. 2 mL 11  . EPINEPHrine 0.3 mg/0.3 mL IJ SOAJ injection Inject 0.3 mg into the muscle as needed for  anaphylaxis.     [name] Fluticasone-Salmeterol (ADVAIR DISKUS) 250-50 MCG/DOSE AEPB Inhale 1 puff into the lungs 2 (two) times daily. 60 each 5  . HYDROcodone-acetaminophen (NORCO/VICODIN) 5-325 MG tablet Take 1 tablet by mouth every 6 (six) hours as needed for moderate pain.     [name] ipratropium-albuterol (DUONEB) 0.5-2.5 (3) MG/3ML SOLN Take 3 mLs by nebulization 4 (four) times daily as needed (wheezing/shortness of breath). 360 mL 11  . losartan-hydrochlorothiazide (HYZAAR) 100-12.5 MG tablet Take 1 tablet by mouth daily.    . magnesium oxide (MAG-OX) 400 (241.3 Mg) MG tablet Take 1 tablet (400 mg total) by mouth daily. (Patient taking differently: Take 400 mg by mouth daily in the afternoon. ) 30 tablet 0  . metoprolol succinate (TOPROL-XL) 50 MG 24 hr tablet Take 1 tablet (50 mg total) by mouth daily. 90 tablet 3  . Multiple Vitamin (MULTIVITAMIN WITH MINERALS) TABS tablet Take 1 tablet by mouth daily. (Patient taking differently: Take 1 tablet by mouth daily in the afternoon. ) 30 tablet 0  . pantoprazole (PROTONIX) 40 MG tablet Take 1 tablet (40 mg total) by mouth 2 (two) times daily. 90 tablet 3  . potassium chloride 20 MEQ TBCR Take 20 mEq by mouth daily. (Patient taking differently: Take 20 mEq by mouth daily in the afternoon. ) 30 tablet 0  . Spacer/Aero-Holding Chambers (AEROCHAMBER MV) inhaler Use as instructed 1 each 0  . tizanidine (ZANAFLEX) 2 MG capsule Take 1 capsule (2 mg total) by mouth 3 (three) times daily as needed for muscle spasms. 30 capsule 1  . predniSONE (DELTASONE) 10 MG tablet Take 5 mg by mouth daily.      No facility-administered medications prior to visit.     Review of Systems:   Constitutional:   No  weight loss, night sweats,  Fevers, chills, +    fatigue, or  lassitude.  HEENT:   No headaches,  Difficulty swallowing,  Tooth/dental problems, or  Sore throat,                No sneezing, itching, ear ache, nasal congestion, post nasal drip,   CV:  No chest pain,   Orthopnea, PND, swelling in lower extremities, anasarca, dizziness, palpitations, syncope.   GI  No heartburn, indigestion, abdominal pain, nausea, vomiting, diarrhea, change in bowel habits, loss of appetite, bloody stools.   Resp:   No chest wall deformity  Skin: no rash or lesions.  GU: no dysuria, change in color of urine, no urgency or frequency.  No flank pain, no hematuria   MS:  No joint pain or swelling.  No decreased range of motion.  No back pain.    Physical Exam  BP 126/82 (BP Location: Left Arm, Cuff Size: Normal)   Pulse 96   Temp 98 F (36.7 C) (Oral)   Ht 5\' 7"  (1.702 m)   Wt 214 lb (97.1 kg)   SpO2  94% Comment: 02 2l  BMI 33.52 kg/m   GEN: A/Ox3; pleasant , NAD, elderly on oxygen   HEENT:  [place]/AT,, NOSE-clear, THROAT-clear, no lesions, no postnasal drip or exudate noted.   NECK:  Supple w/ fair ROM; no JVD; normal carotid impulses w/o bruits; no thyromegaly or nodules palpated; no lymphadenopathy.    RESP  Clear  P & A; w/o, wheezes/ rales/ or rhonchi. no accessory muscle use, no dullness to percussion  CARD:  RRR, no m/r/g, no peripheral edema, pulses intact, no cyanosis or clubbing.  GI:   Soft & nt; nml bowel sounds; no organomegaly or masses detected.   Musco: Warm bil, no deformities or joint swelling noted.   Neuro: alert, no focal deficits noted.    Skin: Warm, no lesions or rashes    Lab Results:  CBC  BMET  Imaging: DG Chest 1 View  Result Date: 02/19/2020 CLINICAL DATA:  Shortness of breath EXAM: CHEST  1 VIEW COMPARISON:  CTA chest 02/09/2020 FINDINGS: Opacity in the right mid lung likely corresponds to atelectasis adjacent 2 large bulla, better depicted on recent CTA of the chest. There is bibasilar atelectasis. No pleural effusion. Normal cardiomediastinal contours. No focal airspace consolidation. IMPRESSION: Bibasilar atelectasis and large bulla without focal airspace disease. Electronically Signed   By: Ulyses Jarred M.D.   On:  02/19/2020 01:08   MR THORACIC SPINE W WO CONTRAST  Result Date: 02/26/2020 CLINICAL DATA:  Initial evaluation for acute mid back pain, compression fracture seen on prior CT. EXAM: MRI THORACIC WITHOUT AND WITH CONTRAST TECHNIQUE: Multiplanar and multiecho pulse sequences of the thoracic spine were obtained without and with intravenous contrast. CONTRAST:  34mL GADAVIST GADOBUTROL 1 MMOL/ML IV SOLN COMPARISON:  Prior CT from 02/09/2020. FINDINGS: MRI THORACIC SPINE FINDINGS Alignment: Vertebral bodies normally aligned with preservation of the normal thoracic kyphosis. No listhesis. Vertebrae: Acute burst type compression fracture involving the T7 vertebral body with associated height loss of up to 50% without significant bony retropulsion. This is benign/mechanical in appearance. Additional acute to subacute compression fracture involving the superior endplate of 624THL with mild 20% height loss without bony retropulsion, also benign/mechanical in appearance. Vertebral body height otherwise maintained without evidence for acute or chronic fracture. Underlying bone marrow signal intensity within normal limits. No discrete or worrisome osseous lesions. No other abnormal marrow edema or enhancement. No evidence for osteomyelitis discitis or septic arthritis. Cord: Diffuse prominence of the dorsal epidural fat seen throughout the thoracic spine, most pronounced at T3 through T11-12 with associated moderate to severe diffuse spinal stenosis, most pronounced at T6-7. Spinal cord is displaced anteriorly and somewhat compressed. No definite abnormal enhancement or cord signal abnormality. Paraspinal and other soft tissues: Paraspinous soft tissues demonstrate no acute finding. Prominent extrapleural fat noted about the lungs. Visualized lungs are otherwise clear. Visualized visceral structures otherwise unremarkable. Disc levels: T5-6: Mild disc bulge with superimposed small right paracentral disc protrusion. Prominence of  the dorsal epidural fat with resultant moderate to severe spinal stenosis. Mild cord flattening. Foramina remain patent. C6-7: Mild disc bulge with prominence of the dorsal epidural fat. Associated severe spinal stenosis. Cord flattening without cord signal changes. Foramina remain patent. T9-10: Mild disc bulge. Prominence of the dorsal epidural fat with resultant severe spinal stenosis. Thoracic spinal cord displaced anteriorly in compressed at its posterior aspect. No cord signal changes. Foramina remain patent. T10-11: Moderate sized right paracentral disc protrusion indents the right ventral thecal sac, contacting and flattening the right hemi cord (series 22,  image 31). Prominence of the dorsal epidural fat. Posterior element hypertrophy. Resultant fairly severe spinal stenosis. No cord signal changes. Foramina remain patent. T11-12: Mild disc bulge. Prominence of the dorsal epidural fat with mild posterior element hypertrophy. Mild spinal stenosis without cord impingement. Foramina remain patent. No other significant degenerative changes seen within the thoracic spine. IMPRESSION: 1. Acute burst type compression fracture involving the T7 vertebral body with up to 50% height loss without bony retropulsion. This is benign/mechanical in appearance. 2. Additional acute to subacute compression fracture involving the superior endplate of 624THL with mild 20% height loss without bony retropulsion. This is also benign in appearance. 3. Diffuse prominence of the dorsal epidural fat throughout the thoracic spine with resultant moderate to severe diffuse spinal stenosis, most pronounced within the midthoracic spine. No cord signal changes. 4. Moderate sized right paracentral disc protrusion at T10-11, contacting and flattening the right hemi cord without cord signal changes. Electronically Signed   By: Jeannine Boga M.D.   On: 02/26/2020 04:49   DG Chest Portable 1 View  Result Date: 02/16/2020 CLINICAL DATA:   Shortness of breath. EXAM: PORTABLE CHEST 1 VIEW COMPARISON:  02/09/2020.  CT 02/09/2020, 01/28/2020 FINDINGS: Mediastinum and hilar structures normal. Heart size normal. Prominent bullous changes again noted over the right lung. Bibasilar atelectasis/scarring again noted. Mild bibasilar infiltrates cannot be excluded. No pleural effusion or pneumothorax. Heart size normal. IMPRESSION: 1.  Prominent bullous changes again noted over the right lung. 2. Bibasilar atelectasis/scarring again noted. Mild bibasilar infiltrates cannot be excluded. Electronically Signed   By: Marcello Moores  Register   On: 02/16/2020 10:32    dupilumab (DUPIXENT) prefilled syringe 600 mg    Date Action Dose Route User   Discharged on 02/19/2020   Admitted on 02/19/2020   Discharged on 02/17/2020   Admitted on 02/16/2020   Discharged on 02/09/2020   Admitted on 02/09/2020   02/05/2020 1151 Given 600 mg Subcutaneous (Other) Elton Sin, LPN      PFT Results Latest Ref Rng & Units 01/29/2016  FVC-Pre L 2.61  FVC-Predicted Pre % 61  FVC-Post L 2.62  FVC-Predicted Post % 61  Pre FEV1/FVC % % 65  Post FEV1/FCV % % 65  FEV1-Pre L 1.69  FEV1-Predicted Pre % 52  FEV1-Post L 1.70  DLCO UNC% % 26  DLCO COR %Predicted % 35    No results found for: NITRICOXIDE      Assessment & Plan:   COPD exacerbation ([place]) Appears currently compensated continue on current regimen. Prone to frequent exacerbations.  Patient is continue on prednisone 5 mg daily.  Hopefully once his Dupixent is started and he is doing better we can start to taper off of prednisone completely  Plan  Patient Instructions  We will check on [place] paperwork.  Continue on Advair Twice daily  , rinse after use.  Albuterol As needed   Activity as tolerated.  Continue on Oxygen 2l/m  Can look at Simply Go Concentrator that gives continuous flow oxygen 2l/m .  Good luck with back procedure this week.  Continue on Prednisone 5mg  daily  Follow up with Dr.  Patsey Berthold in 4-6 weeks and As needed   Please contact office for sooner follow up if symptoms do not improve or worsen or seek emergency care    '   Asthma Moderate persistent asthma with allergic phenotype, elevated IgE.  Patient is to continue on [place].  It has recently been approved and he will be restarted.  Plan  Patient  Instructions  We will check on Dupixent paperwork.  Continue on Advair Twice daily  , rinse after use.  Albuterol As needed   Activity as tolerated.  Continue on Oxygen 2l/m  Can look at Simply Go Concentrator that gives continuous flow oxygen 2l/m .  Good luck with back procedure this week.  Continue on Prednisone 5mg  daily  Follow up with Dr. Patsey Berthold in 4-6 weeks and As needed   Please contact office for sooner follow up if symptoms do not improve or worsen or seek emergency care       Chronic respiratory failure with hypoxia ([place]) Chronic respiratory failure oxygen dependent continue on oxygen 2 L to keep O2 saturations greater than 88 to 90%.      Rexene Edison, NP 03/12/2020

## 2020-03-12 NOTE — Assessment & Plan Note (Signed)
Moderate persistent asthma with allergic phenotype, elevated IgE.  Patient is to continue on [place].  It has recently been approved and he will be restarted.  Plan  Patient Instructions  We will check on Dupixent paperwork.  Continue on Advair Twice daily  , rinse after use.  Albuterol As needed   Activity as tolerated.  Continue on Oxygen 2l/m  Can look at Simply Go Concentrator that gives continuous flow oxygen 2l/m .  Good luck with back procedure this week.  Continue on Prednisone 5mg  daily  Follow up with Dr. Patsey Berthold in 4-6 weeks and As needed   Please contact office for sooner follow up if symptoms do not improve or worsen or seek emergency care

## 2020-03-12 NOTE — Assessment & Plan Note (Addendum)
Appears currently compensated continue on current regimen. Prone to frequent exacerbations.  Patient is continue on prednisone 5 mg daily.  Hopefully once his Dupixent is started and he is doing better we can start to taper off of prednisone completely  Plan  Patient Instructions  We will check on Austin Patterson paperwork.  Continue on Advair Twice daily  , rinse after use.  Albuterol As needed   Activity as tolerated.  Continue on Oxygen 2l/m  Can look at Simply Go Concentrator that gives continuous flow oxygen 2l/m .  Good luck with back procedure this week.  Continue on Prednisone 5mg  daily  Follow up with Dr. Patsey Patterson in 4-6 weeks and As needed   Please contact office for sooner follow up if symptoms do not improve or worsen or seek emergency care    '

## 2020-03-12 NOTE — Assessment & Plan Note (Signed)
Chronic respiratory failure oxygen dependent continue on oxygen 2 L to keep O2 saturations greater than 88 to 90%.

## 2020-03-12 NOTE — Telephone Encounter (Signed)
Called Dupixent myway. Spoke with rep. She advised that the medication Dupixent was approved and they have been trying to reach the pt to discuss shipment. I provided pt with the phone number to call them. Nothing further needed.

## 2020-03-13 ENCOUNTER — Encounter
Admission: RE | Admit: 2020-03-13 | Discharge: 2020-03-13 | Disposition: A | Discharge status: Home / Self Care | Payer: Medicare HMO | Source: Ambulatory Visit | Attending: Orthopedic Surgery | Admitting: Orthopedic Surgery

## 2020-03-13 HISTORY — DX: Gastro-esophageal reflux disease without esophagitis: K21.9

## 2020-03-13 LAB — SARS CORONAVIRUS 2 (TAT 6-24 HRS): SARS Coronavirus 2: NEGATIVE

## 2020-03-13 MED ORDER — CLINDAMYCIN PHOSPHATE 900 MG/50ML IV SOLN
900.0000 mg | INTRAVENOUS | Status: AC
  Administered 2020-03-14: 900 mg via INTRAVENOUS

## 2020-03-13 NOTE — Patient Instructions (Signed)
Your procedure is scheduled on: 03/14/20 Report to [facility]. To find out your arrival time please call [phone] between 1PM - 3PM on 03/13/20.  Remember: Instructions that are not followed completely may result in serious medical risk, up to and including death, or upon the discretion of your surgeon and anesthesiologist your surgery may need to be rescheduled.     _X__ 1. Do not eat food after midnight the night before your procedure.                 No gum chewing or hard candies. You may drink clear liquids up to 2 hours                 before you are scheduled to arrive for your surgery- DO not drink clear                 liquids within 2 hours of the start of your surgery.                 Clear Liquids include:  water, apple juice without pulp, clear carbohydrate                 drink such as Clearfast or Gatorade, Black Coffee or Tea (Do not add                 anything to coffee or tea). Diabetics water only  __X__2.  On the morning of surgery brush your teeth with toothpaste and water, you                 may rinse your mouth with mouthwash if you wish.  Do not swallow any              toothpaste of mouthwash.     _X__ 3.  No Alcohol for 24 hours before or after surgery.   _X__ 4.  Do Not Smoke or use e-cigarettes For 24 Hours Prior to Your Surgery.                 Do not use any chewable tobacco products for at least 6 hours prior to                 surgery.  ____  5.  Bring all medications with you on the day of surgery if instructed.   __X__  6.  Notify your doctor if there is any change in your medical condition      (cold, fever, infections).     Do not wear jewelry, make-up, hairpins, clips or nail polish. Do not wear lotions, powders, or perfumes.  Do not shave 48 hours prior to surgery. Men may shave face and neck. Do not bring valuables to the hospital.    [facility] is not responsible for any belongings or  valuables.  Contacts, dentures/partials or body piercings may not be worn into surgery. Bring a case for your contacts, glasses or hearing aids, a denture cup will be supplied. Leave your suitcase in the car. After surgery it may be brought to your room. For patients admitted to the hospital, discharge time is determined by your treatment team.   Patients discharged the day of surgery will not be allowed to drive home.   Please read over the following fact sheets that you were given:   MRSA Information  __X__ Take these medicines the morning of surgery with A SIP OF WATER:  1. amLODipine (NORVASC) 10 MG tablet  2. atorvastatin (LIPITOR) 40 MG tablet  3. magnesium oxide (MAG-OX) 400 (241.3 Mg) MG tablet  4. metoprolol succinate (TOPROL-XL) 50 MG 24 hr tablet  5. pantoprazole (PROTONIX) 40 MG tablet  6. predniSONE 5 MG TBEC  ____ Fleet Enema (as directed)   __X__ Use CHG Soap/SAGE wipes as directed  __X__ Use inhalers on the day of surgery AND NEB TX PRIOR TO ARRIVAL  ____ Stop metformin/Janumet/Farxiga 2 days prior to surgery    ____ Take 1/2 of usual insulin dose the night before surgery. No insulin the morning          of surgery.   __X__ Stop Blood Thinners Coumadin/Plavix/Xarelto/Pleta/Pradaxa/Eliquis/Effient/  on (LAST DOSE 03/08/20)   Or contact your Surgeon, Cardiologist or Medical Doctor regarding  ability to stop your blood thinners  __X__ Stop Anti-inflammatories 7 days before surgery such as Advil, Ibuprofen, Motrin,  BC or Goodies Powder, Naprosyn, Naproxen, Aleve   __X__ Stop all herbal supplements, fish oil or vitamin E until after surgery.    ____ Bring C-Pap to the hospital.

## 2020-03-13 NOTE — Pre-Procedure Instructions (Addendum)
Patient's PAT appt was after his covid test, where he would have received printed instructions for pre-op, incentive spirometer and Ensure carb drink. Verbal instructions were provided.

## 2020-03-14 ENCOUNTER — Encounter
Admission: RE | Disposition: A | Discharge status: Home / Self Care | Payer: Self-pay | Source: Home / Self Care | Attending: Orthopedic Surgery

## 2020-03-14 ENCOUNTER — Ambulatory Visit: Payer: Medicare HMO | Admitting: Certified Registered Nurse Anesthetist

## 2020-03-14 ENCOUNTER — Ambulatory Visit
Admission: RE | Admit: 2020-03-14 | Discharge: 2020-03-14 | Disposition: A | Discharge status: Home / Self Care | Payer: Medicare HMO | Attending: Orthopedic Surgery | Admitting: Orthopedic Surgery

## 2020-03-14 ENCOUNTER — Encounter: Payer: Self-pay | Admitting: Orthopedic Surgery

## 2020-03-14 ENCOUNTER — Ambulatory Visit: Payer: Medicare HMO

## 2020-03-14 ENCOUNTER — Other Ambulatory Visit: Payer: Self-pay

## 2020-03-14 DIAGNOSIS — K2270 Barrett's esophagus without dysplasia: Secondary | ICD-10-CM | POA: Diagnosis not present

## 2020-03-14 DIAGNOSIS — I2510 Atherosclerotic heart disease of native coronary artery without angina pectoris: Secondary | ICD-10-CM | POA: Diagnosis not present

## 2020-03-14 DIAGNOSIS — Z79899 Other long term (current) drug therapy: Secondary | ICD-10-CM | POA: Diagnosis not present

## 2020-03-14 DIAGNOSIS — M4854XA Collapsed vertebra, not elsewhere classified, thoracic region, initial encounter for fracture: Secondary | ICD-10-CM | POA: Diagnosis present

## 2020-03-14 DIAGNOSIS — J84112 Idiopathic pulmonary fibrosis: Secondary | ICD-10-CM | POA: Diagnosis not present

## 2020-03-14 DIAGNOSIS — Z7902 Long term (current) use of antithrombotics/antiplatelets: Secondary | ICD-10-CM | POA: Insufficient documentation

## 2020-03-14 DIAGNOSIS — J449 Chronic obstructive pulmonary disease, unspecified: Secondary | ICD-10-CM | POA: Insufficient documentation

## 2020-03-14 DIAGNOSIS — Z419 Encounter for procedure for purposes other than remedying health state, unspecified: Secondary | ICD-10-CM

## 2020-03-14 DIAGNOSIS — Z955 Presence of coronary angioplasty implant and graft: Secondary | ICD-10-CM | POA: Diagnosis not present

## 2020-03-14 DIAGNOSIS — Z7982 Long term (current) use of aspirin: Secondary | ICD-10-CM | POA: Insufficient documentation

## 2020-03-14 DIAGNOSIS — E785 Hyperlipidemia, unspecified: Secondary | ICD-10-CM | POA: Insufficient documentation

## 2020-03-14 DIAGNOSIS — J9610 Chronic respiratory failure, unspecified whether with hypoxia or hypercapnia: Secondary | ICD-10-CM | POA: Diagnosis not present

## 2020-03-14 DIAGNOSIS — K219 Gastro-esophageal reflux disease without esophagitis: Secondary | ICD-10-CM | POA: Diagnosis not present

## 2020-03-14 DIAGNOSIS — Z7951 Long term (current) use of inhaled steroids: Secondary | ICD-10-CM | POA: Insufficient documentation

## 2020-03-14 DIAGNOSIS — I10 Essential (primary) hypertension: Secondary | ICD-10-CM | POA: Insufficient documentation

## 2020-03-14 DIAGNOSIS — Z880 Allergy status to penicillin: Secondary | ICD-10-CM | POA: Insufficient documentation

## 2020-03-14 DIAGNOSIS — Z7952 Long term (current) use of systemic steroids: Secondary | ICD-10-CM | POA: Insufficient documentation

## 2020-03-14 DIAGNOSIS — Z9981 Dependence on supplemental oxygen: Secondary | ICD-10-CM | POA: Insufficient documentation

## 2020-03-14 DIAGNOSIS — Z87891 Personal history of nicotine dependence: Secondary | ICD-10-CM | POA: Insufficient documentation

## 2020-03-14 HISTORY — PX: KYPHOPLASTY: SHX5884

## 2020-03-14 LAB — POCT I-STAT, CHEM 8
BUN: 9 mg/dL (ref 8–23)
Calcium, Ion: 1.14 mmol/L — ABNORMAL LOW (ref 1.15–1.40)
Chloride: 92 mmol/L — ABNORMAL LOW (ref 98–111)
Creatinine, Ser: 0.7 mg/dL (ref 0.61–1.24)
Glucose, Bld: 138 mg/dL — ABNORMAL HIGH (ref 70–99)
HCT: 41 % (ref 39.0–52.0)
Hemoglobin: 13.9 g/dL (ref 13.0–17.0)
Potassium: 3.3 mmol/L — ABNORMAL LOW (ref 3.5–5.1)
Sodium: 132 mmol/L — ABNORMAL LOW (ref 135–145)
TCO2: 26 mmol/L (ref 22–32)

## 2020-03-14 SURGERY — KYPHOPLASTY
Anesthesia: General | Site: Back

## 2020-03-14 MED ORDER — PROPOFOL 10 MG/ML IV BOLUS
INTRAVENOUS | Status: AC
  Filled 2020-03-14: qty 20

## 2020-03-14 MED ORDER — IOHEXOL 180 MG/ML  SOLN
INTRAMUSCULAR | Status: DC | PRN
  Administered 2020-03-14: 40 mL

## 2020-03-14 MED ORDER — CLINDAMYCIN PHOSPHATE 900 MG/50ML IV SOLN
INTRAVENOUS | Status: AC
  Filled 2020-03-14: qty 50

## 2020-03-14 MED ORDER — GLYCOPYRROLATE 0.2 MG/ML IJ SOLN
INTRAMUSCULAR | Status: AC
  Filled 2020-03-14: qty 1

## 2020-03-14 MED ORDER — ORAL CARE MOUTH RINSE: 15.0000 mL | Freq: Once | OROMUCOSAL | Status: DC

## 2020-03-14 MED ORDER — MIDAZOLAM HCL 2 MG/2ML IJ SOLN
INTRAMUSCULAR | Status: AC
  Filled 2020-03-14: qty 2

## 2020-03-14 MED ORDER — PROPOFOL 10 MG/ML IV BOLUS
INTRAVENOUS | Status: DC | PRN
Start: 2020-03-14 — End: 2020-03-14
  Administered 2020-03-14 (×3): 20 mg via INTRAVENOUS

## 2020-03-14 MED ORDER — IPRATROPIUM-ALBUTEROL 0.5-2.5 (3) MG/3ML IN SOLN: 3.0000 mL | Freq: Once | RESPIRATORY_TRACT | Status: AC

## 2020-03-14 MED ORDER — LIDOCAINE HCL (PF) 1 % IJ SOLN
INTRAMUSCULAR | Status: AC
  Filled 2020-03-14: qty 60

## 2020-03-14 MED ORDER — CHLORHEXIDINE GLUCONATE 0.12 % MT SOLN: 15.0000 mL | Freq: Once | OROMUCOSAL | Status: DC

## 2020-03-14 MED ORDER — MIDAZOLAM HCL 2 MG/2ML IJ SOLN
INTRAMUSCULAR | Status: DC | PRN
  Administered 2020-03-14: 2 mg via INTRAVENOUS

## 2020-03-14 MED ORDER — BUPIVACAINE-EPINEPHRINE (PF) 0.5% -1:200000 IJ SOLN
INTRAMUSCULAR | Status: DC | PRN
  Administered 2020-03-14: 30 mL via PERINEURAL

## 2020-03-14 MED ORDER — FENTANYL CITRATE (PF) 100 MCG/2ML IJ SOLN: 25.0000 ug | INTRAMUSCULAR | Status: DC | PRN

## 2020-03-14 MED ORDER — LIDOCAINE HCL (CARDIAC) PF 100 MG/5ML IV SOSY
PREFILLED_SYRINGE | INTRAVENOUS | Status: DC | PRN
  Administered 2020-03-14: 60 mg via INTRAVENOUS
  Administered 2020-03-14: 40 mg via INTRAVENOUS

## 2020-03-14 MED ORDER — LIDOCAINE HCL 1 % IJ SOLN
INTRAMUSCULAR | Status: DC | PRN
  Administered 2020-03-14: 20 mL

## 2020-03-14 MED ORDER — HYDROCODONE-ACETAMINOPHEN 5-325 MG PO TABS: 1.0000 | ORAL_TABLET | Freq: Four times a day (QID) | ORAL | 0 refills | Status: DC | PRN

## 2020-03-14 MED ORDER — ONDANSETRON HCL 4 MG/2ML IJ SOLN: 4.0000 mg | Freq: Once | INTRAMUSCULAR | Status: DC | PRN

## 2020-03-14 MED ORDER — DEXMEDETOMIDINE HCL 200 MCG/2ML IV SOLN
INTRAVENOUS | Status: DC | PRN
  Administered 2020-03-14 (×3): 20 ug via INTRAVENOUS

## 2020-03-14 MED ORDER — KETAMINE HCL 10 MG/ML IJ SOLN
INTRAMUSCULAR | Status: DC | PRN
  Administered 2020-03-14: 10 mg via INTRAVENOUS
  Administered 2020-03-14 (×2): 20 mg via INTRAVENOUS

## 2020-03-14 MED ORDER — LIDOCAINE HCL (PF) 2 % IJ SOLN
INTRAMUSCULAR | Status: AC
  Filled 2020-03-14: qty 5

## 2020-03-14 MED ORDER — GLYCOPYRROLATE 0.2 MG/ML IJ SOLN
INTRAMUSCULAR | Status: DC | PRN
Start: 2020-03-14 — End: 2020-03-14
  Administered 2020-03-14 (×2): .1 mg via INTRAVENOUS

## 2020-03-14 MED ORDER — LACTATED RINGERS IV SOLN: INTRAVENOUS | Status: DC

## 2020-03-14 MED ORDER — IPRATROPIUM-ALBUTEROL 0.5-2.5 (3) MG/3ML IN SOLN
RESPIRATORY_TRACT | Status: AC
  Administered 2020-03-14: 3 mL via RESPIRATORY_TRACT
  Filled 2020-03-14: qty 3

## 2020-03-14 MED ORDER — PROPOFOL 500 MG/50ML IV EMUL
INTRAVENOUS | Status: DC | PRN
  Administered 2020-03-14: 40 ug/kg/min via INTRAVENOUS

## 2020-03-14 MED ORDER — BUPIVACAINE-EPINEPHRINE (PF) 0.5% -1:200000 IJ SOLN
INTRAMUSCULAR | Status: AC
  Filled 2020-03-14: qty 30

## 2020-03-14 SURGICAL SUPPLY — 25 items
CEMENT KYPHON CX01A KIT/MIXER (Cement) ×2 IMPLANT
COVER WAND RF STERILE (DRAPES) ×2 IMPLANT
DERMABOND ADVANCED (GAUZE/BANDAGES/DRESSINGS) ×1
DERMABOND ADVANCED .7 DNX12 (GAUZE/BANDAGES/DRESSINGS) ×1 IMPLANT
DEVICE BIOPSY BONE KYPH (INSTRUMENTS) ×1 IMPLANT
DEVICE BIOPSY BONE KYPHX (INSTRUMENTS) ×2 IMPLANT
DRAPE C-ARM XRAY 36X54 (DRAPES) ×2 IMPLANT
DURAPREP 26ML APPLICATOR (WOUND CARE) ×2 IMPLANT
FEE RENTAL RFA GENERATOR (MISCELLANEOUS) IMPLANT
GLOVE SURG SYN 9.0  PF PI (GLOVE) ×1
GLOVE SURG SYN 9.0 PF PI (GLOVE) ×1 IMPLANT
GOWN SRG 2XL LVL 4 RGLN SLV (GOWNS) ×1 IMPLANT
GOWN STRL NON-REIN 2XL LVL4 (GOWNS) ×1
GOWN STRL REUS W/ TWL LRG LVL3 (GOWN DISPOSABLE) ×1 IMPLANT
GOWN STRL REUS W/TWL LRG LVL3 (GOWN DISPOSABLE) ×1
PACK KYPHOPLASTY (MISCELLANEOUS) ×2 IMPLANT
RENTAL RFA GENERATOR (MISCELLANEOUS) IMPLANT
STRAP SAFETY 5IN WIDE (MISCELLANEOUS) ×2 IMPLANT
SWABSTK COMLB BENZOIN TINCTURE (MISCELLANEOUS) ×2 IMPLANT
SYS CARTRIDGE BONE CEMENT 8ML (SYSTAGENIX WOUND MANAGEMENT) ×2
SYSTEM CARTRIDG BONE CEMNT 8ML (SYSTAGENIX WOUND MANAGEMENT) IMPLANT
SYSTEM GUN BONE FILLER SZ2 (MISCELLANEOUS) ×1 IMPLANT
TRAY KYPHOPAK 15/2 EXPRESS (KITS) ×1 IMPLANT
TRAY KYPHOPAK 15/3 EXPRESS 1ST (MISCELLANEOUS) ×1 IMPLANT
TRAY KYPHOPAK 20/3 EXPRESS 1ST (MISCELLANEOUS) ×1 IMPLANT

## 2020-03-14 NOTE — Transfer of Care (Signed)
Immediate Anesthesia Transfer of Care Note  Patient: Wales Larrison Allensworth  Procedure(s) Performed: T7 & T11 KYPHOPLASTY (N/A Back)  Patient Location: PACU  Anesthesia Type:General  Level of Consciousness: drowsy  Airway & Oxygen Therapy: Patient Spontanous Breathing and Patient connected to nasal cannula oxygen  Post-op Assessment: Report given to RN and Post -op Vital signs reviewed and stable  Post vital signs: Reviewed and stable  Last Vitals:  Vitals Value Taken Time  BP 97/60 03/14/20 1434  Temp    Pulse 91 03/14/20 1436  Resp 17 03/14/20 1436  SpO2 95 % 03/14/20 1436  Vitals shown include unvalidated device data.  Last Pain:  Vitals:   03/14/20 1029  TempSrc: Tympanic  PainSc: 6          Complications: No apparent anesthesia complications

## 2020-03-14 NOTE — Anesthesia Preprocedure Evaluation (Addendum)
Anesthesia Evaluation  Patient identified by MRN, date of birth, ID band Patient awake    Reviewed: Allergy & Precautions, H&P , NPO status , Patient's Chart, lab work & pertinent test results  Airway Mallampati: II  TM Distance: >3 FB Neck ROM: full    Dental  (+) Chipped   Pulmonary asthma , COPD,  COPD inhaler and oxygen dependent, Patient abstained from smoking., former smoker,  Idiopathic pulmonary fibrosis, chronic respiratory failure    + decreased breath sounds+ wheezing (scattered wheeze)      Cardiovascular hypertension, + CAD   Rhythm:regular     Neuro/Psych negative neurological ROS  negative psych ROS   GI/Hepatic hiatal hernia, GERD  ,(+)     substance abuse  alcohol use,   Endo/Other  negative endocrine ROS  Renal/GU negative Renal ROS  negative genitourinary   Musculoskeletal   Abdominal   Peds  Hematology negative hematology ROS (+)   Anesthesia Other Findings Past Medical History: No date: COPD (chronic obstructive pulmonary disease) ([facility]) No date: GERD (gastroesophageal reflux disease) No date: Hyperlipidemia No date: Hypertension 11/2015: Pulmonary fibrosis ([place])  Past Surgical History: No date: COLONOSCOPY No date: CORONARY STENT PLACEMENT 09/23/2016: ESOPHAGOGASTRODUODENOSCOPY (EGD) WITH PROPOFOL; N/A     Comment:  Procedure: ESOPHAGOGASTRODUODENOSCOPY (EGD) WITH               PROPOFOL;  Surgeon: [name], MD;  Location: [facility]               ENDOSCOPY;  Service: Endoscopy;  Laterality: N/A; No date: SHOULDER ACROMIOPLASTY  BMI    Body Mass Index: 33.53 kg/m      Reproductive/Obstetrics negative OB ROS                            Anesthesia Physical Anesthesia Plan  ASA: III  Anesthesia Plan: General   Post-op Pain Management:    Induction:   PONV Risk Score and Plan: Propofol infusion and TIVA  Airway Management Planned: Natural Airway and Simple  Face Mask  Additional Equipment:   Intra-op Plan:   Post-operative Plan:   Informed Consent: I have reviewed the patients History and Physical, chart, labs and discussed the procedure including the risks, benefits and alternatives for the proposed anesthesia with the patient or authorized representative who has indicated his/her understanding and acceptance.     Dental Advisory Given  Plan Discussed with: Anesthesiologist, CRNA and Surgeon  Anesthesia Plan Comments: (Duoneb given in preop holding)       Anesthesia Quick Evaluation

## 2020-03-14 NOTE — H&P (Addendum)
Chief Complaint  Patient presents with  . Establish Care  Thoracic compression fractures   History of the Present Illness: Austin Patterson is a 64 y.o. male here for evaluation of T7 and T11 compression fractures. He underwent an MRI on 02/25/2020 at [facility] There is an acute fracture at T7 with 50 percent height loss on the MRI, and a subacute fracture of the superior endplate of 624THL. There is also a small disc bulge at T5-T6 and other multiple levels, and a T10-T11 moderate right size disc bulge.   The patient states he received an injection, after which his back became painful. He then presented to the hospital and stayed there for 6 days. His back pain was a 10 out of 10. He received a CT scan which did not reveal the cause of his pain. He came back to the hospital where they did a CT scan with contrast and that revealed the compression fractures. The pain started in 11/2019, and became worse around the end of 12/2019. He was given 8 hours worth of oxycodone at that time.   The patient takes Plavix. He has had a bone density scan many years ago. The patient has pulmonary fibrosis and is on oxygen.   I have reviewed past medical, surgical, social and family history, and allergies as documented in the EMR.  Past Medical History: Past Medical History:  Diagnosis Date  . Asthma, unspecified asthma severity, unspecified whether complicated, unspecified whether persistent  . Barrett's esophagus  . COPD (chronic obstructive pulmonary disease) (CMS-HCC)  . GERD (gastroesophageal reflux disease)  . Hyperlipidemia  . Hyperlipidemia, acquired  . Hypertension  . Pulmonary fibrosis (CMS-HCC)   Past Surgical History: Past Surgical History:  Procedure Laterality Date  . Coronary strent placed  . Shoulder acromioplasty   Past Family History: History reviewed. No pertinent family history.  Medications: Current Outpatient Medications Ordered in Epic  Medication Sig Dispense Refill  .  albuterol 90 mcg/actuation inhaler Inhale 2 inhalations into the lungs every 6 (six) hours as needed  . alpha 1-proteinase inhibitor (ZEMAIRA) 1,000 mg injection  . amLODIPine (NORVASC) 10 MG tablet Take 10 mg by mouth once daily  . aspirin 81 MG EC tablet Take 81 mg by mouth once daily  . atorvastatin (LIPITOR) 40 MG tablet Take 40 mg by mouth once daily  . calcium carbonate-vitamin D3 (OS-CAL 500+D) 500 mg(1,250mg ) -200 unit tablet Take 1 tablet by mouth 2 (two) times daily with meals  . cetirizine (ZYRTEC) 10 mg capsule Take 10 mg by mouth once daily  . clopidogreL (PLAVIX) 75 mg tablet Take 75 mg by mouth once daily  . dupilumab (DUPIXENT PEN) 300 mg/2 mL pen injector Inject 2 mLs subcutaneously every 14 (fourteen) days  . EPINEPHrine (EPIPEN) 0.3 mg/0.3 mL auto-injector Inject 0.3 mLs into the muscle as needed  . fluticasone propion-salmeteroL (ADVAIR DISKUS) 250-50 mcg/dose diskus inhaler Inhale 1 inhalation into the lungs 2 (two) times daily  . hydrocodone-homatropine (HYCODAN) 5-1.5 mg/5 mL syrup Take 5 mLs by mouth every 6 (six) hours as needed for Cough. 120 mL 0  . ipratropium-albuteroL (DUO-NEB) nebulizer solution Inhale 3 mLs into the lungs every 4 (four) hours as needed For wheezing  . losartan-hydrochlorothiazide (HYZAAR) 100-12.5 mg tablet Take 1 tablet by mouth once daily  . magnesium oxide (MAG-OX) 400 mg (241.3 mg magnesium) tablet Take 400 mg by mouth once daily  . metoprolol succinate (TOPROL-XL) 50 MG XL tablet Take 50 mg by mouth once daily  . pantoprazole (  PROTONIX) 40 MG DR tablet Take 40 mg by mouth once daily  . potassium chloride (KLOR-CON) 20 MEQ ER tablet Take 20 mEq by mouth 2 (two) times daily  . predniSONE (DELTASONE) 10 MG tablet Take 1 tablet by mouth once daily  . prenatal vitamin-iron-FA-DHA (PRENATE DHA) 27 mg iron-1 mg -300 mg capsule Take 1 capsule by mouth once daily  . HYDROcodone-acetaminophen (NORCO) 5-325 mg tablet Take 1 tablet by mouth every 6 (six)  hours as needed for up to 25 doses 15 tablet 0   No current Epic-ordered facility-administered medications on file.   Allergies: Allergies  Allergen Reactions  . Amoxicillin Anaphylaxis    Body mass index is 32.98 kg/m.  Review of Systems: A comprehensive 14 point ROS was performed, reviewed, and the pertinent orthopaedic findings are documented in the HPI.  Vitals:  03/08/20 0821  BP: 126/72   General Physical Examination:  General/Constitutional: No apparent distress: well-nourished and well developed. Eyes: Pupils equal, round with synchronous movement. Lungs: Clear to auscultation HEENT: Normal Vascular: No edema, swelling or tenderness, except as noted in detailed exam. Cardiac: Heart rate and rhythm is regular. Integumentary: No impressive skin lesions present, except as noted in detailed exam. Neuro/Psych: Normal mood and affect, oriented to person, place and time.  Musculoskeletal Examination: On exam, tenderness over T7 and in the lumbar area. Lungs with occasional wheezing. Heart rate and rhythm is normal. HEENT is normal.  Radiographs: No new imaging studies were obtained or reviewed today.  Assessment: ICD-10-CM  1. Thoracic compression fracture, closed, initial encounter (CMS-HCC) S22.000A   Plan: The patient has clinical findings of T7 and T11 compression fractures.   We discussed the patient's prior x-ray findings. Recommendation is for T7 and T11 kyphoplasty and I explained the procedure and postoperative course in detail. I gave the patient a brochure and used the bone model to demonstrate. The patient will need to be off his Plavix for 7 days prior to surgery.   Plan is for T7 and T11 kyphoplasty. The patient will be scheduled for surgery in the near furture.  Surgical Risks:  The nature of the condition and the proposed procedure has been reviewed in detail with the patient. Surgical versus non-surgical options and prognosis for recovery have been  reviewed and the inherent risks and benefits of each have been discussed including the risks of infection, bleeding, injury to nerves/blood vessels/tendons, incomplete relief of symptoms, persisting pain and/or stiffness, loss of function, complex regional pain syndrome, failure of the procedure, as appropriate.  Scribe Attestation: I, Dawn Royse, am acting as scribe for [employer], MD    Electronically signed by Lauris Poag, MD at 03/10/2020 3:22 PM EDT   Reviewed paper H+P, will be scanned into chart. No changes noted.

## 2020-03-14 NOTE — Op Note (Signed)
Date Mar 14, 2020  time 2:30 PM   PATIENT:  Austin Patterson   PRE-OPERATIVE DIAGNOSIS:  closed wedge compression fracture of T7 and T11   POST-OPERATIVE DIAGNOSIS:  closed wedge compression fracture of T7 and T11   PROCEDURE:  Procedure(s): KYPHOPLASTY T7 and T11  SURGEON: Laurene Footman, MD   ASSISTANTS: None   ANESTHESIA:   local and MAC   EBL:  No intake/output data recorded.   BLOOD ADMINISTERED:none   DRAINS: none    LOCAL MEDICATIONS USED:  MARCAINE    and XYLOCAINE    SPECIMEN:   T7 and T11 vertebral body biopsies   DISPOSITION OF SPECIMEN:  Pathology   COUNTS:  YES   TOURNIQUET:  * No tourniquets in log *   IMPLANTS: Bone cement   DICTATION: .Dragon Dictation  patient was brought to the operating room and after adequate anesthesia was obtained the patient was placed prone.  C arm was brought in in good visualization of the affected level obtained on both AP and lateral projections.  After patient identification and timeout procedures were completed, local anesthetic was infiltrated with 10 cc 1% Xylocaine infiltrated subcutaneously.  This is done the area on the each side of the planned approach.  The back was then prepped and draped in the usual sterile manner and repeat timeout procedure carried out.  A spinal needle was brought down to the pedicle on the right side of  T7 and T11 and a 50-50 mix of 1% Xylocaine half percent Sensorcaine with epinephrine total of 20 cc injected on each side.  After allowing this to set a small incision was made and the trocar was advanced into the vertebral body in an extrapedicular fashion.  Biopsy was obtained at each level Drilling was carried out balloon inserted with inflation to  1 cc on the right at T7 and 2 cc at T11.  When the cement was appropriate consistency 1.5 cc were injected on the right at T7 and 2-1/2 cc on the right at T11 into the vertebral body without extravasation at T11 but some extravasation into the T6-T7  vertebral disc space, good fill superior to inferior endplates and from right to left sides along the inferior endplate.  After the cement had set the trochar was removed and permanent C-arm views obtained.  The wound was closed with Dermabond followed by Band-Aid   PLAN OF CARE:  Discharge after recovery room   PATIENT DISPOSITION:  PACU - hemodynamically stable.

## 2020-03-14 NOTE — Anesthesia Postprocedure Evaluation (Signed)
Anesthesia Post Note  Patient: Austin Patterson  Procedure(s) Performed: T7 & T11 KYPHOPLASTY (N/A Back)  Patient location during evaluation: PACU Anesthesia Type: General Level of consciousness: awake and alert Pain management: pain level controlled Vital Signs Assessment: post-procedure vital signs reviewed and stable Respiratory status: spontaneous breathing, nonlabored ventilation and respiratory function stable Cardiovascular status: blood pressure returned to baseline and stable Postop Assessment: no apparent nausea or vomiting Anesthetic complications: no     Last Vitals:  Vitals:   03/14/20 1515 03/14/20 1533  BP: 114/79 137/77  Pulse: 84 80  Resp: 18 16  Temp: 36.4 C 36.4 C  SpO2: 100% 100%    Last Pain:  Vitals:   03/14/20 1533  TempSrc: Temporal  PainSc:                  Tera Mater

## 2020-03-14 NOTE — Discharge Instructions (Addendum)
Take it easy today and tomorrow. Remove Band-Aids on Saturday morning then okay to shower. Try to walk as much as you can starting Saturday.  Avoid bending at the waist or lifting over 5 pounds for 2 week, at least as much as possible. Pain medicine as directed. Call office if you are having problems.  AMBULATORY SURGERY  DISCHARGE INSTRUCTIONS   1) The drugs that you were given will stay in your system until tomorrow so for the next 24 hours you should not:  A) Drive an automobile B) Make any legal decisions C) Drink any alcoholic beverage   2) You may resume regular meals tomorrow.  Today it is better to start with liquids and gradually work up to solid foods.  You may eat anything you prefer, but it is better to start with liquids, then soup and crackers, and gradually work up to solid foods.   3) Please notify your doctor immediately if you have any unusual bleeding, trouble breathing, redness and pain at the surgery site, drainage, fever, or pain not relieved by medication.    4) Additional Instructions:        Please contact your physician with any problems or Same Day Surgery at [phone], Monday through Friday 6 am to 4 pm, or Advance at [facility] number at [phone].

## 2020-03-15 ENCOUNTER — Telehealth: Payer: Self-pay | Admitting: Pulmonary Disease

## 2020-03-19 LAB — SURGICAL PATHOLOGY

## 2020-03-20 NOTE — Telephone Encounter (Signed)
Called [employer] and spoke with [place]. There was an issue with reading the original date on the enrollment for for Dupixent. This has been cleared up. Nothing further was needed.

## 2020-03-21 NOTE — Progress Notes (Signed)
Agree with the details of the visit as per Lovey Newcomer, NP.   Loletha Grayer Derrill Kay, MD [place]

## 2020-03-24 ENCOUNTER — Emergency Department
Admission: EM | Admit: 2020-03-24 | Discharge: 2020-03-24 | Disposition: A | Discharge status: Home / Self Care | Payer: Medicare HMO | Attending: Emergency Medicine | Admitting: Emergency Medicine

## 2020-03-24 ENCOUNTER — Other Ambulatory Visit: Payer: Self-pay

## 2020-03-24 ENCOUNTER — Emergency Department: Payer: Medicare HMO

## 2020-03-24 DIAGNOSIS — Z87891 Personal history of nicotine dependence: Secondary | ICD-10-CM | POA: Diagnosis not present

## 2020-03-24 DIAGNOSIS — I10 Essential (primary) hypertension: Secondary | ICD-10-CM | POA: Diagnosis not present

## 2020-03-24 DIAGNOSIS — Z79899 Other long term (current) drug therapy: Secondary | ICD-10-CM | POA: Diagnosis not present

## 2020-03-24 DIAGNOSIS — I2510 Atherosclerotic heart disease of native coronary artery without angina pectoris: Secondary | ICD-10-CM | POA: Diagnosis not present

## 2020-03-24 DIAGNOSIS — Z9981 Dependence on supplemental oxygen: Secondary | ICD-10-CM | POA: Insufficient documentation

## 2020-03-24 DIAGNOSIS — J441 Chronic obstructive pulmonary disease with (acute) exacerbation: Secondary | ICD-10-CM

## 2020-03-24 DIAGNOSIS — R0602 Shortness of breath: Secondary | ICD-10-CM | POA: Diagnosis present

## 2020-03-24 LAB — CBC
HCT: 35.1 % — ABNORMAL LOW (ref 39.0–52.0)
Hemoglobin: 12 g/dL — ABNORMAL LOW (ref 13.0–17.0)
MCH: 33.1 pg (ref 26.0–34.0)
MCHC: 34.2 g/dL (ref 30.0–36.0)
MCV: 97 fL (ref 80.0–100.0)
Platelets: 281 10*3/uL (ref 150–400)
RBC: 3.62 MIL/uL — ABNORMAL LOW (ref 4.22–5.81)
RDW: 14.6 % (ref 11.5–15.5)
WBC: 7.7 10*3/uL (ref 4.0–10.5)
nRBC: 0 % (ref 0.0–0.2)

## 2020-03-24 LAB — BASIC METABOLIC PANEL
Anion gap: 10 (ref 5–15)
BUN: 10 mg/dL (ref 8–23)
CO2: 25 mmol/L (ref 22–32)
Calcium: 7.8 mg/dL — ABNORMAL LOW (ref 8.9–10.3)
Chloride: 98 mmol/L (ref 98–111)
Creatinine, Ser: 0.61 mg/dL (ref 0.61–1.24)
GFR calc Af Amer: >60 mL/min (ref >60)
GFR calc non Af Amer: >60 mL/min (ref >60)
Glucose, Bld: 88 mg/dL (ref 70–99)
Potassium: 3.7 mmol/L (ref 3.5–5.1)
Sodium: 133 mmol/L — ABNORMAL LOW (ref 135–145)

## 2020-03-24 LAB — TROPONIN I (HIGH SENSITIVITY): Troponin I (High Sensitivity): 5 ng/L (ref <18)

## 2020-03-24 MED ORDER — METHYLPREDNISOLONE SODIUM SUCC 125 MG IJ SOLR
125.0000 mg | Freq: Once | INTRAMUSCULAR | Status: AC
  Administered 2020-03-24: 125 mg via INTRAVENOUS
  Filled 2020-03-24: qty 2

## 2020-03-24 MED ORDER — IPRATROPIUM-ALBUTEROL 0.5-2.5 (3) MG/3ML IN SOLN
3.0000 mL | Freq: Once | RESPIRATORY_TRACT | Status: AC
  Administered 2020-03-24: 3 mL via RESPIRATORY_TRACT
  Filled 2020-03-24: qty 3

## 2020-03-24 MED ORDER — PREDNISONE 10 MG (21) PO TBPK
ORAL_TABLET | ORAL | 0 refills | Status: DC
Start: 2020-03-24 — End: 2020-05-01

## 2020-03-24 NOTE — ED Triage Notes (Signed)
Pt arrives to ER POV for SOB, HA, back pain. States had back surgery last week. On 2 L [place] chronically but states has had to be on 3 L recently. Denies cough. Denies fever. A&O.

## 2020-03-24 NOTE — Discharge Instructions (Signed)
Take the steroid taper for the next 6 days (starting tomorrow) and then return to your normal 5 mg dose.  Use your albuterol either by nebulizer or inhaler every 4 hours.  Return to the ER for new, worsening, or persistent severe shortness of breath, fever, cough, leg swelling, chest pain, weakness, or any other new or worsening symptoms that concern you.

## 2020-03-24 NOTE — ED Provider Notes (Signed)
[facility] Emergency Department Provider Note ____________________________________________   First MD Initiated Contact with Patient 03/24/20 0915     (approximate)  I have reviewed the triage vital signs and the nursing notes.   HISTORY  Chief Complaint Shortness of Breath    HPI Austin Patterson is a 64 y.o. male with PMH as noted below including COPD on 2 to 3 L of O2 at home who presents with shortness of breath over the last several days, gradual onset, not associated with cough or fever.  The patient states that he has not had to increase his O2 significantly.  He recently had back surgery but has been doing well since he went home.  He denies any persistent leg pain or swelling although occasional shooting pains to both legs.  Past Medical History:  Diagnosis Date  . COPD (chronic obstructive pulmonary disease) ([place])   . GERD (gastroesophageal reflux disease)   . Hyperlipidemia   . Hypertension   . Pulmonary fibrosis ([place]) 11/2015    Patient Active Problem List   Diagnosis Date Noted  . Asthma 03/12/2020  . Chronic respiratory failure with hypoxia ([place]) 03/12/2020  . Hyperlipidemia   . COPD exacerbation ([place])   . Bleeding nose   . Alcohol abuse   . RUQ abdominal pain   . Anaphylaxis 11/01/2019  . Hypotension 11/01/2019  . CAD (coronary artery disease) 11/01/2019  . H/O heart artery stent 11/01/2019  . Acute on chronic respiratory failure with hypoxia ([place])   . RUQ pain   . Essential hypertension   . IPF (idiopathic pulmonary fibrosis) ([place]) 08/30/2019  . Hypomagnesemia 03/31/2017  . Hiatal hernia   . Reflux esophagitis   . Hematemesis without nausea   . Hyponatremia 09/21/2016  . Multifocal pneumonia 12/09/2015    Past Surgical History:  Procedure Laterality Date  . COLONOSCOPY    . CORONARY STENT PLACEMENT    . ESOPHAGOGASTRODUODENOSCOPY (EGD) WITH PROPOFOL N/A 09/23/2016   Procedure: ESOPHAGOGASTRODUODENOSCOPY (EGD) WITH  PROPOFOL;  Surgeon: [name], MD;  Location: [facility] ENDOSCOPY;  Service: Endoscopy;  Laterality: N/A;  . KYPHOPLASTY N/A 03/14/2020   Procedure: T7 & T11 KYPHOPLASTY;  Surgeon: Hessie Knows, MD;  Location: [facility] ORS;  Service: Orthopedics;  Laterality: N/A;  . SHOULDER ACROMIOPLASTY      Prior to Admission medications   Medication Sig Start Date End Date Taking? Authorizing Provider  acetaminophen (TYLENOL) 500 MG tablet Take 1,000 mg by mouth every 6 (six) hours as needed for moderate pain or headache.    [provider]  albuterol (VENTOLIN HFA) 108 (90 Base) MCG/ACT inhaler Inhale 2 puffs into the lungs every 6 (six) hours as needed for wheezing or shortness of breath. 01/29/20   Danford, Suann Larry, MD  amLODipine (NORVASC) 10 MG tablet Take 1 tablet (10 mg total) by mouth daily. 08/19/16   Tawni Millers, MD  aspirin EC 81 MG tablet Take 1 tablet (81 mg total) by mouth daily. 08/19/16   Tawni Millers, MD  atorvastatin (LIPITOR) 40 MG tablet Take 1 tablet (40 mg total) by mouth at bedtime. Patient taking differently: Take 40 mg by mouth daily.  08/19/16   Tawni Millers, MD  clopidogrel (PLAVIX) 75 MG tablet Take 1 tablet (75 mg total) by mouth daily. 08/19/16   Tawni Millers, MD  Dupilumab (DUPIXENT) 300 MG/2ML SOPN Inject 2 mLs into the skin every 14 (fourteen) days. Patient not taking: Reported on 03/14/2020 12/05/19   Tyler Pita, MD  EPINEPHrine 0.3 mg/0.3 mL IJ SOAJ injection Inject 0.3 mg into the muscle as needed for anaphylaxis.     [provider]  Fluticasone-Salmeterol (ADVAIR DISKUS) 250-50 MCG/DOSE AEPB Inhale 1 puff into the lungs 2 (two) times daily. 10/31/19 10/30/20  Tyler Pita, MD  HYDROcodone-acetaminophen (NORCO/VICODIN) 5-325 MG tablet Take 1 tablet by mouth every 6 (six) hours as needed for moderate pain. 03/14/20   Hessie Knows, MD  ipratropium-albuterol (DUONEB) 0.5-2.5 (3) MG/3ML SOLN Take 3 mLs by nebulization 4 (four) times daily as  needed (wheezing/shortness of breath). 01/29/20   Danford, Suann Larry, MD  losartan-hydrochlorothiazide (HYZAAR) 100-12.5 MG tablet Take 1 tablet by mouth daily. 11/05/19   [provider]  magnesium oxide (MAG-OX) 400 (241.3 Mg) MG tablet Take 1 tablet (400 mg total) by mouth daily. Patient taking differently: Take 400 mg by mouth daily in the afternoon.  06/18/17   Hillary Bow, MD  metoprolol succinate (TOPROL-XL) 50 MG 24 hr tablet Take 1 tablet (50 mg total) by mouth daily. 08/19/16   Tawni Millers, MD  Multiple Vitamin (MULTIVITAMIN WITH MINERALS) TABS tablet Take 1 tablet by mouth daily. Patient taking differently: Take 1 tablet by mouth daily in the afternoon.  09/24/16   Fritzi Mandes, MD  pantoprazole (PROTONIX) 40 MG tablet Take 1 tablet (40 mg total) by mouth 2 (two) times daily. 09/23/16   Fritzi Mandes, MD  potassium chloride 20 MEQ TBCR Take 20 mEq by mouth daily. Patient taking differently: Take 20 mEq by mouth daily in the afternoon.  06/18/17   Hillary Bow, MD  predniSONE (STERAPRED UNI-PAK 21 TAB) 10 MG (21) TBPK tablet Take 6 tablets by mouth on day 1 (tomorrow), 5 tablets on day 2, 4 tablets on day 3, 3 tablets on day 4, 2 tablets on day 5, 1 tablet on day 6. 03/24/20   Arta Silence, MD  predniSONE 5 MG TBEC Take 1 tablet by mouth daily. 03/12/20   Parrett, Fonnie Mu, NP  Spacer/Aero-Holding Chambers (AEROCHAMBER MV) inhaler Use as instructed 10/05/19   Tyler Pita, MD    Allergies Amoxicillin and Tizanidine  Family History  Problem Relation Age of Onset  . Heart disease Mother     Social History Social History   Tobacco Use  . Smoking status: Former Smoker    Packs/day: 2.00    Years: 50.00    Pack years: 100.00    Types: Cigarettes    Quit date: 10/14/2015    Years since quitting: 4.4  . Smokeless tobacco: Former Network engineer Use Topics  . Alcohol use: Yes    Alcohol/week: 28.0 standard drinks    Types: 28 Cans of beer per week     Comment: 6 12 oz cans of beer/day  . Drug use: No    Review of Systems  Constitutional: No fever. Eyes: No redness. ENT: No sore throat. Cardiovascular: Denies chest pain. Respiratory: Positive for shortness of breath. Gastrointestinal: No vomiting or diarrhea.  Genitourinary: Negative for flank pain. Musculoskeletal: Negative for acute back pain. Skin: Negative for rash. Neurological: Negative for headaches, focal weakness or numbness.   ____________________________________________   PHYSICAL EXAM:  VITAL SIGNS: ED Triage Vitals  Enc Vitals Group     BP 03/24/20 0913 (!) 162/94     Pulse Rate 03/24/20 0913 90     Resp 03/24/20 0913 (!) 22     Temp 03/24/20 0913 97.9 F (36.6 C)     Temp Source 03/24/20 0913 Oral  SpO2 03/24/20 0913 97 %     Weight 03/24/20 0914 210 lb (95.3 kg)     Height 03/24/20 0914 5\' 7"  (1.702 m)     Head Circumference --      Peak Flow --      Pain Score 03/24/20 0913 8     Pain Loc --      Pain Edu? --      Excl. in [place]? --     Constitutional: Alert and oriented.  Relatively well appearing and in no acute distress. Eyes: Conjunctivae are normal.  Head: Atraumatic. Nose: No congestion/rhinnorhea. Mouth/Throat: Mucous membranes are moist.   Neck: Normal range of motion.  Cardiovascular: Normal rate, regular rhythm. Grossly normal heart sounds.  Good peripheral circulation. Respiratory: Normal respiratory effort.  No retractions.  Decreased breath sounds bilaterally with scattered wheezing. Gastrointestinal: No distention.  Musculoskeletal: No lower extremity edema.  Extremities warm and well perfused.  No calf or popliteal swelling or tenderness. Neurologic:  Normal speech and language. No gross focal neurologic deficits are appreciated.  Skin:  Skin is warm and dry. No rash noted. Psychiatric: Mood and affect are normal. Speech and behavior are normal.  ____________________________________________   LABS (all labs ordered are  listed, but only abnormal results are displayed)  Labs Reviewed  BASIC METABOLIC PANEL - Abnormal; Notable for the following components:      Result Value   Sodium 133 (*)    Calcium 7.8 (*)    All other components within normal limits  CBC - Abnormal; Notable for the following components:   RBC 3.62 (*)    Hemoglobin 12.0 (*)    HCT 35.1 (*)    All other components within normal limits  TROPONIN I (HIGH SENSITIVITY)   ____________________________________________  EKG  ED ECG REPORT I, Arta Silence, the attending physician, personally viewed and interpreted this ECG.  Date: 03/24/2020 EKG Time: 09 14 Rate: 91 Rhythm: normal sinus rhythm QRS Axis: normal Intervals: normal ST/T Wave abnormalities: normal Narrative Interpretation: no evidence of acute ischemia ____________________________________________  RADIOLOGY  CXR: No focal infiltrate or edema  ____________________________________________   PROCEDURES  Procedure(s) performed: No  Procedures  Critical Care performed: No ____________________________________________   INITIAL IMPRESSION / ASSESSMENT AND PLAN / ED COURSE  Pertinent labs & imaging results that were available during my care of the patient were reviewed by me and considered in my medical decision making (see chart for details).  64 year old male with PMH as noted above including COPD on home O2 presents with gradual onset shortness of breath over the last several days, not associated with cough or fever.  He had back surgery 2 weeks ago but states he has been doing well with this.  He denies any leg pain or swelling and has no history of DVT.  I reviewed the past medical records in [place].  The patient is status post kyphoplasty of T7 and T11 on 5/27.  On exam today, he is well-appearing.  His vital signs are normal except for hypertension.  O2 saturation is in the high 90s on his normal 3 L by nasal cannula.  He does have diminished breath  sounds and some wheezing bilaterally.  There is no calf or popliteal swelling or tenderness.  Overall presentation is most consistent with mild COPD exacerbation.  We will obtain chest x-ray, lab work-up, and reassess.  Differential includes pneumonia, acute bronchitis, new onset CHF.  I do not suspect PE given that he has no DVT symptoms, no tachycardia  or significant hypoxia, and no chest pain.  ----------------------------------------- 1:00 PM on 03/24/2020 -----------------------------------------  Chest x-ray shows no focal infiltrate or other acute change.  Lab work-up is reassuring.  The patient reports significant improvement in his symptoms after bronchodilators and steroid.  He would like to go home.  He is stable for discharge at this time.  Return precautions given, he expressed understanding.  ____________________________________________   FINAL CLINICAL IMPRESSION(S) / ED DIAGNOSES  Final diagnoses:  COPD exacerbation ([place])      NEW MEDICATIONS STARTED DURING THIS VISIT:  New Prescriptions   PREDNISONE (STERAPRED UNI-PAK 21 TAB) 10 MG (21) TBPK TABLET    Take 6 tablets by mouth on day 1 (tomorrow), 5 tablets on day 2, 4 tablets on day 3, 3 tablets on day 4, 2 tablets on day 5, 1 tablet on day 6.     Note:  This document was prepared using Dragon voice recognition software and may include unintentional dictation errors.   Arta Silence, MD 03/24/20 1301

## 2020-04-11 ENCOUNTER — Telehealth: Payer: Self-pay | Admitting: Pulmonary Disease

## 2020-04-12 NOTE — Telephone Encounter (Signed)
Called and spoke with Patient. Patient stated he was waiting on his transportation to confirm appointment 04/15/20, at 1130.  Advised Patient to call if he needed to change appointment time. Understanding stated.  Patient requested to keep 04/15/20 1130 Dupixent injection appointment time.  Nothing further at this time.

## 2020-04-15 ENCOUNTER — Other Ambulatory Visit: Payer: Self-pay

## 2020-04-15 ENCOUNTER — Ambulatory Visit (INDEPENDENT_AMBULATORY_CARE_PROVIDER_SITE_OTHER): Payer: Medicare HMO

## 2020-04-15 DIAGNOSIS — J4540 Moderate persistent asthma, uncomplicated: Secondary | ICD-10-CM | POA: Diagnosis not present

## 2020-04-15 MED ORDER — DUPILUMAB 300 MG/2ML ~~LOC~~ SOSY
300.0000 mg | PREFILLED_SYRINGE | Freq: Once | SUBCUTANEOUS | Status: AC
  Administered 2020-04-15: 300 mg via SUBCUTANEOUS

## 2020-04-15 NOTE — Progress Notes (Signed)
All questions were answered by the patient before medication was administered. Have you been hospitalized in the last 10 days? No Do you have a fever? No Do you have a cough? No Do you have a headache or sore  throat? No   Pt came in today for his Dupixent injection. He stated that I needed to show him how to give this injection at home because "he wasn't coming up her anymore." I looked through the pt's chart and there isn't any documentation from Dr. Patsey Berthold authorizing him to be taught for self administration. Pt became very agitated when I advised him of this information. Stated, "I ain't coming back up here for you guys do something that I can do myself for free." Medication was given back to the pt. I did not instruct him on how to do at home injections as there is no documentation from Dr. Ronalee Belts approving this.

## 2020-04-19 ENCOUNTER — Other Ambulatory Visit: Payer: Self-pay | Admitting: Orthopedic Surgery

## 2020-04-19 ENCOUNTER — Other Ambulatory Visit (HOSPITAL_COMMUNITY): Payer: Self-pay | Admitting: Orthopedic Surgery

## 2020-04-19 DIAGNOSIS — S32010A Wedge compression fracture of first lumbar vertebra, initial encounter for closed fracture: Secondary | ICD-10-CM

## 2020-04-19 DIAGNOSIS — S22080A Wedge compression fracture of T11-T12 vertebra, initial encounter for closed fracture: Secondary | ICD-10-CM

## 2020-04-19 DIAGNOSIS — Z9889 Other specified postprocedural states: Secondary | ICD-10-CM

## 2020-04-29 ENCOUNTER — Other Ambulatory Visit: Payer: Self-pay | Admitting: Pulmonary Disease

## 2020-05-01 ENCOUNTER — Other Ambulatory Visit: Payer: Self-pay

## 2020-05-01 ENCOUNTER — Encounter: Payer: Self-pay | Admitting: Pulmonary Disease

## 2020-05-01 ENCOUNTER — Ambulatory Visit: Payer: Medicare HMO | Admitting: Pulmonary Disease

## 2020-05-01 VITALS — BP 128/76 | HR 100 | Temp 97.9°F | Ht 67.0 in | Wt 206.0 lb

## 2020-05-01 DIAGNOSIS — J449 Chronic obstructive pulmonary disease, unspecified: Secondary | ICD-10-CM

## 2020-05-01 DIAGNOSIS — J438 Other emphysema: Secondary | ICD-10-CM

## 2020-05-01 DIAGNOSIS — J849 Interstitial pulmonary disease, unspecified: Secondary | ICD-10-CM

## 2020-05-01 DIAGNOSIS — J4550 Severe persistent asthma, uncomplicated: Secondary | ICD-10-CM | POA: Diagnosis not present

## 2020-05-01 DIAGNOSIS — J4489 Other specified chronic obstructive pulmonary disease: Secondary | ICD-10-CM

## 2020-05-01 DIAGNOSIS — J9611 Chronic respiratory failure with hypoxia: Secondary | ICD-10-CM

## 2020-05-01 DIAGNOSIS — Z87891 Personal history of nicotine dependence: Secondary | ICD-10-CM

## 2020-05-01 NOTE — Patient Instructions (Signed)
What we discussed today:  Continue Dupixent  Continue your breathing medicines  Let us see you back in 2 months time call sooner should any new problems arise

## 2020-05-01 NOTE — Progress Notes (Signed)
Subjective:    Patient ID: Austin Patterson, male    DOB: [birth year], 64 y.o.   MRN: [id number]  HPI Patient is a 63 year old with COPD FEV1 1.23 L or 39% of predicted noted on PFTs of 04/2019.  Patient also has a reversible airways component, IgE at 1,950 and therefore an asthma component.  He has IgE directed to Aspergillus fumigatus.  ABPA in the differential. 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  He is currently on Dupixent and notes today that his breathing is actually improved after a second dose.  He is getting the medication now at home.  He noted marked improvement on his back pain after he underwent kyphoplasty for 1 and T11 compression fractures.  However it appears that he may have other compression fractures that need to be addressed.  He is currently on prednisone 5 mg daily and the hope is that he will be able to get off this medication altogether on the [place].  Other current medications as noted below.  He has not had any recent fevers, chills or sweats.  No change in the character of his cough or sputum production.  No hemoptysis.  He has chronic orthopnea but no paroxysmal nocturnal dyspnea.  No significant lower extremity edema.  No calf tenderness.   Review of Systems A 10 point review of systems was performed and it is as noted above otherwise negative.  Allergies  Allergen Reactions  . Amoxicillin Anaphylaxis  . Tizanidine     Feet and ankle swell    Current Meds  Medication Sig  . acetaminophen (TYLENOL) 500 MG tablet Take 1,000 mg by mouth every 6 (six) hours as needed for moderate pain or headache.  . albuterol (VENTOLIN HFA) 108 (90 Base) MCG/ACT inhaler Inhale 2 puffs into the lungs every 6 (six) hours as needed for wheezing or shortness of breath.  [name] amLODipine (NORVASC) 10 MG  tablet Take 1 tablet (10 mg total) by mouth daily.  [name] aspirin EC 81 MG tablet Take 1 tablet (81 mg total) by mouth daily.  [name] atorvastatin (LIPITOR) 40 MG tablet Take 1 tablet (40 mg total) by mouth at bedtime. (Patient taking differently: Take 40 mg by mouth daily. )  . clopidogrel (PLAVIX) 75 MG tablet Take 1 tablet (75 mg total) by mouth daily.  . Dupilumab (DUPIXENT) 300 MG/2ML SOPN Inject 2 mLs into the skin every 14 (fourteen) days.  [name] EPINEPHrine 0.3 mg/0.3 mL IJ SOAJ injection Inject 0.3 mg into the muscle as needed for anaphylaxis.   [name] Fluticasone-Salmeterol (ADVAIR DISKUS) 250-50 MCG/DOSE AEPB Inhale 1 puff into the lungs 2 (two) times daily.  [name] HYDROcodone-acetaminophen (NORCO/VICODIN) 5-325 MG tablet Take 1 tablet by mouth every 6 (six) hours as needed for moderate pain.  [name] ipratropium-albuterol (DUONEB) 0.5-2.5 (3) MG/3ML SOLN Take 3 mLs by nebulization 4 (four) times daily as needed (wheezing/shortness of breath).  . losartan-hydrochlorothiazide (HYZAAR) 100-12.5 MG tablet Take 1 tablet by mouth daily.  . magnesium oxide (MAG-OX) 400 (241.3 Mg) MG tablet Take 1 tablet (400 mg total) by mouth daily. (Patient taking differently: Take 400 mg by mouth daily in the afternoon. )  . metoprolol succinate (TOPROL-XL) 50 MG 24 hr tablet Take 1 tablet (50 mg total) by mouth daily.  . Multiple Vitamin (MULTIVITAMIN WITH MINERALS) TABS tablet Take 1 tablet by mouth daily. (Patient taking differently: Take 1  tablet by mouth daily in the afternoon. )  . pantoprazole (PROTONIX) 40 MG tablet Take 1 tablet (40 mg total) by mouth 2 (two) times daily.  . potassium chloride 20 MEQ TBCR Take 20 mEq by mouth daily. (Patient taking differently: Take 20 mEq by mouth daily in the afternoon. )  . Spacer/Aero-Holding Chambers (AEROCHAMBER MV) inhaler Use as instructed  . [DISCONTINUED] predniSONE (STERAPRED UNI-PAK 21 TAB) 10 MG (21) TBPK tablet Take 6 tablets by mouth on day 1 (tomorrow), 5 tablets on day 2, 4  tablets on day 3, 3 tablets on day 4, 2 tablets on day 5, 1 tablet on day 6.   Immunization History  Administered Date(s) Administered  . Influenza Inj Mdck Quad Pf 08/10/2019  . Influenza,inj,Quad PF,6+ Mos 10/15/2015, 08/31/2016  . Moderna SARS-COVID-2 Vaccination 11/21/2019, 12/21/2019  . Pneumococcal Polysaccharide-23 10/15/2015   Social History   Tobacco Use  . Smoking status: Former Smoker    Packs/day: 2.00    Years: 50.00    Pack years: 100.00    Types: Cigarettes    Quit date: 10/14/2015    Years since quitting: 4.5  . Smokeless tobacco: Former Network engineer Use Topics  . Alcohol use: Yes    Alcohol/week: 28.0 standard drinks    Types: 28 Cans of beer per week    Comment: 6 12 oz cans of beer/day       Objective:   Physical Exam BP 128/76 (BP Location: Left Arm, Cuff Size: Normal)   Pulse 100   Temp 97.9 F (36.6 C) (Temporal)   Ht 5\' 7"  (1.702 m)   Wt 206 lb (93.4 kg)   SpO2 98%   BMI 32.26 kg/m  GENERAL: Chronically ill appearing gentleman, chronic use of accessories, presents in transport chair.  Wearing oxygen via nasal cannula.  No acute respiratory distress. HEAD: Normocephalic, atraumatic.  EYES: Pupils equal, round, reactive to light.  No scleral icterus.  MOUTH: Nose/mouth/throat not examined due to masking requirements for COVID 19. NECK: Supple. No thyromegaly. Trachea midline. No JVD.  No adenopathy. PULMONARY: Increased AP diameter, significant kyphosis, coarse breath sounds, no wheezes.  No rhonchi.  He is actually moving air very well today. CARDIOVASCULAR: S1 and S2. Regular rate and rhythm.  Distant heart tones no murmur appreciated. GASTROINTESTINAL: Protuberant abdomen otherwise benign. MUSCULOSKELETAL: No joint deformity, no clubbing, no edema.  NEUROLOGIC: No overt focal deficit.  Speech is fluent.  Awake, alert. SKIN: Intact,warm,dry. PSYCH: Jovial today.  Behavior appropriate.    Assessment & Plan:     ICD-10-CM   1. Severe  persistent asthma without complication  I33.82    Continue Advair and DuoNeb Appears well-controlled today  2. Asthma-COPD overlap syndrome (HCC)  J44.9    Significant COPD component due to emphysema Continue inhalers as above Has been reluctant to change inhaler and nebulizer meds Will revisit use of LAMA   3. Interstitial lung disease (HCC)  J84.9    Significant interstitial lung disease Sent with a hypersensitivity pneumonitis type pattern IgE directed to Aspergillus fumigatus  4. Other emphysema (HCC)  J43.8    Giant bulla and emphysema noted Part of asthma/COPD overlap syndrome  5. Chronic respiratory failure with hypoxia (HCC)  J96.11    Continue oxygen supplementation currently at 2 L/min  6. Former smoker  Z87.891    Remains abstinent of cigarettes   Discussion:  Patient has started to show some improvement on Dupixent.  Continue Dupixent therapy.  He has a very significant asthma component to  his pulmonary disease.  Continue medications otherwise as they are.  We will revisit the use of LAMA on follow-up appointment he has been reluctant to change inhaler/nebulizer therapy due to insurance concerns.  We will see him in follow-up in 2 months time, he is to call sooner should any new difficulties arise.   Renold Don, MD [facility]   *This note was dictated using voice recognition software/Dragon.  Despite best efforts to proofread, errors can occur which can change the meaning.  Any change was purely unintentional.

## 2020-05-06 ENCOUNTER — Ambulatory Visit
Admission: RE | Admit: 2020-05-06 | Discharge: 2020-05-06 | Disposition: A | Discharge status: Home / Self Care | Payer: Medicare HMO | Source: Ambulatory Visit | Attending: Orthopedic Surgery | Admitting: Orthopedic Surgery

## 2020-05-06 ENCOUNTER — Other Ambulatory Visit: Payer: Self-pay

## 2020-05-06 DIAGNOSIS — S22080A Wedge compression fracture of T11-T12 vertebra, initial encounter for closed fracture: Secondary | ICD-10-CM | POA: Insufficient documentation

## 2020-05-06 DIAGNOSIS — S32010A Wedge compression fracture of first lumbar vertebra, initial encounter for closed fracture: Secondary | ICD-10-CM | POA: Insufficient documentation

## 2020-05-06 DIAGNOSIS — Z9889 Other specified postprocedural states: Secondary | ICD-10-CM | POA: Diagnosis present

## 2020-07-04 ENCOUNTER — Ambulatory Visit: Payer: Medicare HMO | Admitting: Pulmonary Disease

## 2020-07-31 ENCOUNTER — Ambulatory Visit: Payer: Medicare HMO | Admitting: Pulmonary Disease

## 2020-08-27 ENCOUNTER — Other Ambulatory Visit: Payer: Self-pay

## 2020-08-27 ENCOUNTER — Encounter: Payer: Self-pay | Admitting: Pulmonary Disease

## 2020-08-27 ENCOUNTER — Ambulatory Visit (INDEPENDENT_AMBULATORY_CARE_PROVIDER_SITE_OTHER): Payer: Medicare HMO | Admitting: Pulmonary Disease

## 2020-08-27 VITALS — BP 140/80 | HR 82 | Temp 97.5°F | Ht 67.0 in | Wt 211.0 lb

## 2020-08-27 DIAGNOSIS — J4550 Severe persistent asthma, uncomplicated: Secondary | ICD-10-CM

## 2020-08-27 DIAGNOSIS — J449 Chronic obstructive pulmonary disease, unspecified: Secondary | ICD-10-CM

## 2020-08-27 DIAGNOSIS — J8410 Pulmonary fibrosis, unspecified: Secondary | ICD-10-CM

## 2020-08-27 DIAGNOSIS — J9611 Chronic respiratory failure with hypoxia: Secondary | ICD-10-CM | POA: Diagnosis not present

## 2020-08-27 DIAGNOSIS — Z148 Genetic carrier of other disease: Secondary | ICD-10-CM

## 2020-08-27 DIAGNOSIS — J4489 Other specified chronic obstructive pulmonary disease: Secondary | ICD-10-CM

## 2020-08-27 MED ORDER — BREZTRI AEROSPHERE 160-9-4.8 MCG/ACT IN AERO: 2.0000 | INHALATION_SPRAY | Freq: Two times a day (BID) | RESPIRATORY_TRACT | 0 refills | Status: AC

## 2020-08-27 NOTE — Progress Notes (Signed)
Subjective:    Patient ID: Austin Patterson, male    DOB: [birth year], 64 y.o.   MRN: [id number]  HPI Patient is a 64 year old with COPD FEV1 1.23 L or 39% of predicted noted on PFTs of 04/2019. Patient also has a reversible airways component, IgE at 1,950 and therefore an asthma component.  He has IgE directed to Aspergillus fumigatus.  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  He is currently on Dupixent and notes that his breathing is actually improved on this.  Have been able to wean him off steroids. He is getting Dupixent at home, self-administered.  Previously, he noted marked improvement on his back pain after he underwent kyphoplasty for 1 and T11 compression fractures, he continues to have lasting relief.   Medications have been reviewed.  Overall he feels that he is doing well.  In the past he was on Zemaira for alpha-1 however he never met criteria with regards to alpha-1 antitrypsin level.  Level must be 50 or below to meet criteria.  Particularly on MZ phenotype.   Review of Systems A 10 point review of systems was performed and it is as noted above otherwise negative.  Patient Active Problem List   Diagnosis Date Noted  . Alpha-1-antitrypsin deficiency carrier   . Asthma 03/12/2020  . Chronic respiratory failure with hypoxia ([place]) 03/12/2020  . Hyperlipidemia   . COPD (chronic obstructive pulmonary disease) ([place])   . Bleeding nose   . Alcohol abuse   . RUQ abdominal pain   . Anaphylaxis 11/01/2019  . Hypotension 11/01/2019  . CAD (coronary artery disease) 11/01/2019  . H/O heart artery stent 11/01/2019  . Acute on chronic respiratory failure with hypoxia ([place])   . RUQ pain   . Essential hypertension   . IPF (idiopathic pulmonary fibrosis) ([place]) 08/30/2019  .  Hypomagnesemia 03/31/2017  . Hiatal hernia   . Reflux esophagitis   . Hematemesis without nausea   . Hyponatremia 09/21/2016  . Multifocal pneumonia 12/09/2015   Social History   Tobacco Use  . Smoking status: Former Smoker    Packs/day: 2.00    Years: 50.00    Pack years: 100.00    Types: Cigarettes    Quit date: 10/14/2015    Years since quitting: 5.3  . Smokeless tobacco: Former Network engineer Use Topics  . Alcohol use: Yes    Alcohol/week: 28.0 standard drinks    Types: 28 Cans of beer per week    Comment: occ   Allergies  Allergen Reactions  . Amoxicillin Anaphylaxis  . Tizanidine     Feet and ankle swell    Medications were reviewed.  He has been on Dupixent since June 2021.         Objective:   Physical Exam BP 140/80 (BP Location: Left Arm, Patient Position: Sitting, Cuff Size: Normal)   Pulse 82   Temp (!) 97.5 F (36.4 C) (Temporal)   Ht '5\' 7"'  (1.702 m)   Wt 211 lb (95.7 kg)   SpO2 96%   BMI 33.05 kg/m  GENERAL: Chronically ill appearing gentleman, chronic use of accessories, presents in transport chair.  Wearing oxygen via nasal cannula.  No acute respiratory distress. HEAD: Normocephalic, atraumatic.  EYES: Pupils equal, round, reactive to light. No scleral icterus.  MOUTH: Nose/mouth/throat not examined due to masking requirements for COVID 19. NECK:  Supple. No thyromegaly. Trachea midline. No JVD. No adenopathy. PULMONARY: Increased AP diameter, significant kyphosis, coarse breath sounds, no wheezes. No rhonchi.  He is actually moving air very well. CARDIOVASCULAR: S1 and S2. Regular rate and rhythm. Distant heart tones no murmur appreciated. GASTROINTESTINAL: Protuberant abdomen otherwise benign. MUSCULOSKELETAL: No joint deformity, no clubbing, no edema.  NEUROLOGIC: No overt focal deficit. Speech is fluent.Awake, alert. SKIN: Intact,warm,dry. PSYCH:  Engaging today.  Behavior appropriate.     Assessment & Plan:     ICD-10-CM    1. Asthma-COPD overlap syndrome (HCC)  J44.9    Trial of Breztri 2 puffs twice a day Continue as needed albuterol COPD on the basis of emphysema Moderate to severe persistent asthma  2. Severe persistent asthma without complication  M35.59    Continue Dupixent  3. 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 Alpha-1-antitrypsin deficiency carrier  Z14.8    Phenotype MZ Low normal levels of alpha-1 No supplementation unless levels at 50 or below     Meds ordered this encounter  Medications  . Budeson-Glycopyrrol-Formoterol (BREZTRI AEROSPHERE) 160-9-4.8 MCG/ACT AERO    Sig: Inhale 2 puffs into the lungs in the morning and at bedtime for 1 day.    Dispense:  5.9 g    Refill:  0    Order Specific Question:   Lot Number?    Answer:   [id number]    Order Specific Question:   Expiration Date?    Answer:   11/19/2021    Order Specific Question:   Manufacturer?    Answer:   [employer] [[id number]]   Patient is to follow-up in 6 to 8 weeks time he is to call sooner should any new problems arise.   Renold Don, MD [facility]   *This note was dictated using voice recognition software/Dragon.  Despite best efforts to proofread, errors can occur which can change the meaning.  Any change was purely unintentional.

## 2020-08-27 NOTE — Patient Instructions (Signed)
Stop using Trelegy.  We are giving you samples of Breztri 2 puffs twice a day.  Make sure you rinse your mouth well after you use it.  Know how the street does for you.  We will see you in follow-up in 6 to 8 weeks time call sooner should any new problems arise.

## 2020-09-23 ENCOUNTER — Emergency Department: Payer: Medicare HMO

## 2020-09-23 ENCOUNTER — Other Ambulatory Visit: Payer: Self-pay

## 2020-09-23 ENCOUNTER — Emergency Department
Admission: EM | Admit: 2020-09-23 | Discharge: 2020-09-23 | Disposition: A | Discharge status: Home / Self Care | Payer: Medicare HMO | Attending: Emergency Medicine | Admitting: Emergency Medicine

## 2020-09-23 DIAGNOSIS — J45909 Unspecified asthma, uncomplicated: Secondary | ICD-10-CM | POA: Diagnosis not present

## 2020-09-23 DIAGNOSIS — N50812 Left testicular pain: Secondary | ICD-10-CM | POA: Diagnosis not present

## 2020-09-23 DIAGNOSIS — Z79899 Other long term (current) drug therapy: Secondary | ICD-10-CM | POA: Insufficient documentation

## 2020-09-23 DIAGNOSIS — Z87891 Personal history of nicotine dependence: Secondary | ICD-10-CM | POA: Insufficient documentation

## 2020-09-23 DIAGNOSIS — Z7982 Long term (current) use of aspirin: Secondary | ICD-10-CM | POA: Diagnosis not present

## 2020-09-23 DIAGNOSIS — I2510 Atherosclerotic heart disease of native coronary artery without angina pectoris: Secondary | ICD-10-CM | POA: Insufficient documentation

## 2020-09-23 DIAGNOSIS — Z7901 Long term (current) use of anticoagulants: Secondary | ICD-10-CM | POA: Insufficient documentation

## 2020-09-23 DIAGNOSIS — J441 Chronic obstructive pulmonary disease with (acute) exacerbation: Secondary | ICD-10-CM | POA: Insufficient documentation

## 2020-09-23 DIAGNOSIS — R109 Unspecified abdominal pain: Secondary | ICD-10-CM | POA: Diagnosis not present

## 2020-09-23 DIAGNOSIS — I10 Essential (primary) hypertension: Secondary | ICD-10-CM | POA: Insufficient documentation

## 2020-09-23 LAB — URINALYSIS, COMPLETE (UACMP) WITH MICROSCOPIC
Bacteria, UA: NONE SEEN
Bilirubin Urine: NEGATIVE
Glucose, UA: NEGATIVE mg/dL
Hgb urine dipstick: NEGATIVE
Ketones, ur: NEGATIVE mg/dL
Leukocytes,Ua: NEGATIVE
Nitrite: NEGATIVE
Protein, ur: NEGATIVE mg/dL
Specific Gravity, Urine: 1.01 (ref 1.005–1.030)
Squamous Epithelial / HPF: NONE SEEN (ref 0–5)
pH: 5 (ref 5.0–8.0)

## 2020-09-23 LAB — BASIC METABOLIC PANEL
Anion gap: 11 (ref 5–15)
BUN: 13 mg/dL (ref 8–23)
CO2: 25 mmol/L (ref 22–32)
Calcium: 9.1 mg/dL (ref 8.9–10.3)
Chloride: 86 mmol/L — ABNORMAL LOW (ref 98–111)
Creatinine, Ser: 0.87 mg/dL (ref 0.61–1.24)
GFR, Estimated: >60 mL/min (ref >60)
Glucose, Bld: 119 mg/dL — ABNORMAL HIGH (ref 70–99)
Potassium: 3.6 mmol/L (ref 3.5–5.1)
Sodium: 122 mmol/L — ABNORMAL LOW (ref 135–145)

## 2020-09-23 LAB — CBC
HCT: 38.2 % — ABNORMAL LOW (ref 39.0–52.0)
Hemoglobin: 13.1 g/dL (ref 13.0–17.0)
MCH: 32 pg (ref 26.0–34.0)
MCHC: 34.3 g/dL (ref 30.0–36.0)
MCV: 93.4 fL (ref 80.0–100.0)
Platelets: 311 10*3/uL (ref 150–400)
RBC: 4.09 MIL/uL — ABNORMAL LOW (ref 4.22–5.81)
RDW: 13.4 % (ref 11.5–15.5)
WBC: 10.3 10*3/uL (ref 4.0–10.5)
nRBC: 0 % (ref 0.0–0.2)

## 2020-09-23 LAB — TROPONIN I (HIGH SENSITIVITY)
Troponin I (High Sensitivity): 4 ng/L (ref <18)
Troponin I (High Sensitivity): 5 ng/L (ref <18)

## 2020-09-23 MED ORDER — SODIUM CHLORIDE 1 G PO TABS
2.0000 g | ORAL_TABLET | Freq: Once | ORAL | Status: AC
  Administered 2020-09-23: 2 g via ORAL
  Filled 2020-09-23: qty 2

## 2020-09-23 NOTE — ED Notes (Signed)
Patient transported to CT 

## 2020-09-23 NOTE — ED Notes (Signed)
Messaged pharmacy to send Sodium tablets.

## 2020-09-23 NOTE — ED Triage Notes (Signed)
Pt comes into the ED via EMS from home with c/o left flank and left testicle pain since yesterday, also having chest pain with SOB for "a while with my COPD and other issues. Pt is in NAD on arrival. Pt is on 2L  continuous

## 2020-09-23 NOTE — ED Provider Notes (Signed)
[facility] Emergency Department Provider Note ____________________________________________   First MD Initiated Contact with Patient 09/23/20 1303     (approximate)  I have reviewed the triage vital signs and the nursing notes.  HISTORY  Chief Complaint Flank Pain and Chest Pain   HPI Austin Patterson is a 64 y.o. malewho presents to the ED for evaluation of left-sided flank pain.  Chart review indicates history of COPD followed by pulmonology and remains on 2-3 L home O2. 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  Patient presents to the ED for evaluation of left flank pain and left testicular pain that been present for about 24 hours.  He reports left flank pain primarily, but developed left testicular pain this morning in an atraumatic fashion.  Due to this additional pain, he presents to the ED for evaluation.  When I initially evaluate the patient, he reports slightly improving symptoms, 4/10 intensity at that time and primarily to his left testicle.  He denies any urethral discharge, rashes or lesions to his testicles or penis.  Denies inguinal pain.  Denies trauma.  Denies dysuria.  He denies any associated fever, increased sputum production from his baseline cough, syncope, frontal chest pain or back pain, emesis or diarrhea.   Past Medical History:  Diagnosis Date  . COPD (chronic obstructive pulmonary disease) ([place])   . GERD (gastroesophageal reflux disease)   . Hyperlipidemia   . Hypertension   . Pulmonary fibrosis ([place]) 11/2015    Patient Active Problem List   Diagnosis Date Noted  . Asthma 03/12/2020  . Chronic respiratory failure with hypoxia ([place]) 03/12/2020  . Hyperlipidemia   . COPD exacerbation ([place])   . Bleeding nose   . Alcohol abuse   . RUQ abdominal pain   . Anaphylaxis 11/01/2019  . Hypotension 11/01/2019  . CAD (coronary artery disease) 11/01/2019  . H/O  heart artery stent 11/01/2019  . Acute on chronic respiratory failure with hypoxia ([place])   . RUQ pain   . Essential hypertension   . IPF (idiopathic pulmonary fibrosis) ([place]) 08/30/2019  . Hypomagnesemia 03/31/2017  . Hiatal hernia   . Reflux esophagitis   . Hematemesis without nausea   . Hyponatremia 09/21/2016  . Multifocal pneumonia 12/09/2015    Past Surgical History:  Procedure Laterality Date  . COLONOSCOPY    . CORONARY STENT PLACEMENT    . ESOPHAGOGASTRODUODENOSCOPY (EGD) WITH PROPOFOL N/A 09/23/2016   Procedure: ESOPHAGOGASTRODUODENOSCOPY (EGD) WITH PROPOFOL;  Surgeon: [name], MD;  Location: [facility] ENDOSCOPY;  Service: Endoscopy;  Laterality: N/A;  . KYPHOPLASTY N/A 03/14/2020   Procedure: T7 & T11 KYPHOPLASTY;  Surgeon: Hessie Knows, MD;  Location: [facility] ORS;  Service: Orthopedics;  Laterality: N/A;  . SHOULDER ACROMIOPLASTY      Prior to Admission medications   Medication Sig Start Date End Date Taking? Authorizing Provider  acetaminophen (TYLENOL) 500 MG tablet Take 1,000 mg by mouth every 6 (six) hours as needed for moderate pain or headache.    [provider]  albuterol (VENTOLIN HFA) 108 (90 Base) MCG/ACT inhaler Inhale 2 puffs into the lungs every 6 (six) hours as needed for wheezing or shortness of breath. 01/29/20   Danford, Suann Larry, MD  amLODipine (NORVASC) 10 MG tablet Take 1 tablet (10 mg total) by mouth daily. 08/19/16   Tawni Millers, MD  aspirin EC 81 MG tablet Take 1 tablet (81 mg total) by mouth daily. 08/19/16   Tawni Millers, MD  atorvastatin (LIPITOR) 40 MG tablet Take 1 tablet (40 mg total) by mouth at bedtime. Patient taking differently: Take 40 mg by mouth daily.  08/19/16   Tawni Millers, MD  clopidogrel (PLAVIX) 75 MG tablet Take 1 tablet (75 mg total) by mouth daily. 08/19/16   Tawni Millers, MD  Dupilumab (DUPIXENT) 300 MG/2ML SOPN Inject 2 mLs into the skin every 14 (fourteen) days. 12/05/19   Tyler Pita, MD  EPINEPHrine 0.3  mg/0.3 mL IJ SOAJ injection Inject 0.3 mg into the muscle as needed for anaphylaxis.     [provider]  Fluticasone-Salmeterol (ADVAIR DISKUS) 250-50 MCG/DOSE AEPB Inhale 1 puff into the lungs 2 (two) times daily. 10/31/19 10/30/20  Tyler Pita, MD  Fluticasone-Umeclidin-Vilant (TRELEGY ELLIPTA) 200-62.5-25 MCG/INH AEPB Inhale into the lungs. 07/05/20 07/05/21  [provider]  HYDROcodone-acetaminophen (NORCO/VICODIN) 5-325 MG tablet Take 1 tablet by mouth every 6 (six) hours as needed for moderate pain. 03/14/20   Hessie Knows, MD  ipratropium-albuterol (DUONEB) 0.5-2.5 (3) MG/3ML SOLN Take 3 mLs by nebulization 4 (four) times daily as needed (wheezing/shortness of breath). 01/29/20   Danford, Suann Larry, MD  losartan-hydrochlorothiazide (HYZAAR) 100-12.5 MG tablet Take 1 tablet by mouth daily. 11/05/19   [provider]  magnesium oxide (MAG-OX) 400 (241.3 Mg) MG tablet Take 1 tablet (400 mg total) by mouth daily. Patient taking differently: Take 400 mg by mouth daily in the afternoon.  06/18/17   Hillary Bow, MD  metoprolol succinate (TOPROL-XL) 50 MG 24 hr tablet Take 1 tablet (50 mg total) by mouth daily. 08/19/16   Tawni Millers, MD  Multiple Vitamin (MULTIVITAMIN WITH MINERALS) TABS tablet Take 1 tablet by mouth daily. Patient taking differently: Take 1 tablet by mouth daily in the afternoon.  09/24/16   Fritzi Mandes, MD  pantoprazole (PROTONIX) 40 MG tablet Take 1 tablet (40 mg total) by mouth 2 (two) times daily. 09/23/16   Fritzi Mandes, MD  potassium chloride 20 MEQ TBCR Take 20 mEq by mouth daily. Patient taking differently: Take 20 mEq by mouth daily in the afternoon.  06/18/17   Hillary Bow, MD  predniSONE 5 MG TBEC Take 1 tablet by mouth daily. 03/12/20   Parrett, Fonnie Mu, NP  Spacer/Aero-Holding Chambers (AEROCHAMBER MV) inhaler Use as instructed 10/05/19   Tyler Pita, MD    Allergies Amoxicillin and Tizanidine  Family History  Problem  Relation Age of Onset  . Heart disease Mother     Social History Social History   Tobacco Use  . Smoking status: Former Smoker    Packs/day: 2.00    Years: 50.00    Pack years: 100.00    Types: Cigarettes    Quit date: 10/14/2015    Years since quitting: 4.9  . Smokeless tobacco: Former Network engineer  . Vaping Use: Never used  Substance Use Topics  . Alcohol use: Yes    Alcohol/week: 28.0 standard drinks    Types: 28 Cans of beer per week    Comment: 6 12 oz cans of beer/day  . Drug use: No    Review of Systems  Constitutional: No fever/chills Eyes: No visual changes. ENT: No sore throat. Cardiovascular: Denies chest pain. Respiratory: Denies shortness of breath. Gastrointestinal: No abdominal pain.  No nausea, no vomiting.  No diarrhea.  No constipation. Genitourinary: Negative for dysuria.  Left-sided flank and testicular pain. Musculoskeletal: Negative for back pain. Skin: Negative for rash. Neurological: Negative for headaches, focal weakness or numbness.  ____________________________________________   PHYSICAL EXAM:  VITAL SIGNS: Vitals:   09/23/20 1400 09/23/20 1450  BP: (!) 148/92 (!) 141/92  Pulse:  84  Resp: 14 (!) 22  Temp:    SpO2:  96%     Constitutional: Alert and oriented. Well appearing and in no acute distress. Eyes: Conjunctivae are normal. PERRL. EOMI. Head: Atraumatic. Nose: No congestion/rhinnorhea. Mouth/Throat: Mucous membranes are moist.  Oropharynx non-erythematous. Neck: No stridor. No cervical spine tenderness to palpation. Cardiovascular: Normal rate, regular rhythm. Grossly normal heart sounds.  Good peripheral circulation. Respiratory: Normal respiratory effort.  No retractions. Lungs CTAB. Gastrointestinal: Soft , nondistended. No CVA tenderness. Benign and nontender abdomen.  No CVA tenderness bilaterally. Chaperoned GU examination: Normal-appearing external circumcised genitalia.  No inguinal masses, tenderness or  lesions.  No penile lesions or urethral discharge appreciated.  Nontender and smooth right testicle.  Smooth and minimally tender left testicle without nodularity or overlying lesions. Musculoskeletal: No lower extremity tenderness nor edema.  No joint effusions. No signs of acute trauma. Neurologic:  Normal speech and language. No gross focal neurologic deficits are appreciated. No gait instability noted. Skin:  Skin is warm, dry and intact. No rash noted. Psychiatric: Mood and affect are normal. Speech and behavior are normal.  ____________________________________________   LABS (all labs ordered are listed, but only abnormal results are displayed)  Labs Reviewed  BASIC METABOLIC PANEL - Abnormal; Notable for the following components:      Result Value   Sodium 122 (*)    Chloride 86 (*)    Glucose, Bld 119 (*)    All other components within normal limits  CBC - Abnormal; Notable for the following components:   RBC 4.09 (*)    HCT 38.2 (*)    All other components within normal limits  URINALYSIS, COMPLETE (UACMP) WITH MICROSCOPIC - Abnormal; Notable for the following components:   Color, Urine YELLOW (*)    APPearance CLEAR (*)    All other components within normal limits  TROPONIN I (HIGH SENSITIVITY)  TROPONIN I (HIGH SENSITIVITY)   ____________________________________________  12 Lead EKG  Sinus rhythm, rate of 82 bpm.  Normal axis intervals.  No evidence of acute ischemia. ____________________________________________  RADIOLOGY  ED MD interpretation: 2 view CXR reviewed by me without evidence of acute pulmonary pathology.  Official radiology report(s): DG Chest 2 View  Result Date: 09/23/2020 CLINICAL DATA:  Chest pain EXAM: CHEST - 2 VIEW COMPARISON:  03/24/2020 chest radiograph and prior. FINDINGS: Emphysema with prominent right lower lung bulla. No pneumothorax or pleural effusion. Patchy right basilar opacities. Cardiomediastinal silhouette within normal limits.  Sequela of prior kyphoplasty. IMPRESSION: Patchy right basilar opacities, likely atelectasis.  Emphysema. Electronically Signed   By: Primitivo Gauze M.D.   On: 09/23/2020 08:44   CT Renal Stone Study  Result Date: 09/23/2020 CLINICAL DATA:  Flank pain, kidney stone suspected EXAM: CT ABDOMEN AND PELVIS WITHOUT CONTRAST TECHNIQUE: Multidetector CT imaging of the abdomen and pelvis was performed following the standard protocol without IV contrast. COMPARISON:  Chest CT from April of 2017 and from April of 2021 FINDINGS: Lower chest: Large bulla in the RIGHT chest partially imaged. Signs of pulmonary emphysema. No consolidation. No pleural effusion. RIGHT coronary artery calcification. Hepatobiliary: Liver with mildly lobular contours. No pericholecystic stranding. No gross biliary ductal dilation. Likely some small amount of sludge in the dependent gallbladder. Pancreas: Normal contour without signs of inflammation. No gross ductal dilation. Spleen: Normal size and contour. Adrenals/Urinary Tract: Bilateral adrenal nodules compatible  with adenomas not changed since previous imaging. Renal cortical scarring bilaterally. Urinary bladder smooth contour. No perivesical stranding. No hydronephrosis. No nephrolithiasis. Vascular calcifications of bilateral kidneys. Mild LEFT Peri ureteral stranding without dilation. Stomach/Bowel: Stomach under distended without perigastric stranding. Small bowel is normal caliber. Appendix is normal. Scattered colonic diverticulosis of the sigmoid colon. Vascular/Lymphatic: Calcified atheromatous plaque of the abdominal aorta. No aneurysmal dilation. No abdominal lymphadenopathy. No pelvic lymphadenopathy. Reproductive: Prostate with calcifications. Other: Small fat containing umbilical hernia. No sign of free air. No ascites. Musculoskeletal: Spinal degenerative changes. Lower thoracic kyphoplasty. IMPRESSION: 1. Mild LEFT Peri ureteral stranding without dilation. This is  slightly asymmetric as compared to the RIGHT and is nonspecific, could represent signs of recently passed calculus or UTI. Correlate with urinalysis. 2. Bilateral adrenal adenomas. 3. Colonic diverticulosis without evidence of acute diverticulitis. 4. Emphysema and aortic atherosclerosis. Aortic Atherosclerosis (ICD10-I70.0) and Emphysema (ICD10-J43.9). Electronically Signed   By: Zetta Bills M.D.   On: 09/23/2020 14:58    ____________________________________________   PROCEDURES and INTERVENTIONS  Procedure(s) performed (including Critical Care):  .1-3 Lead EKG Interpretation Performed by: Vladimir Crofts, MD Authorized by: Vladimir Crofts, MD     Interpretation: normal     ECG rate:  80   ECG rate assessment: normal     Rhythm: sinus rhythm     Ectopy: none     Conduction: normal      Medications  sodium chloride tablet 2 g (has no administration in time range)    ____________________________________________   MDM / ED COURSE   64 year old patient with severe emphysema presents to the ED with isolated left flank and testicular pain, possibly due to recently passed ureteral stone versus BPH and urinary retention, ultimately amenable to outpatient management.  Normal vitals on home O2.  Exam reveals minimal left-sided testicular tenderness on a benign abdomen.  He has no overlying skin changes, signs of herpes zoster or testicular masses.  He overall looks quite well and he has clear lungs on his home O2 and without evidence of further acute pathology beyond his flank and testicular pain.  Blood work reveals hyponatremia to 122 and review of his records indicates this is not a new problem for him.  He does report not drinking as much Gatorade as he typically does.  Blood work otherwise unremarkable.  CT imaging without contrast does not demonstrate any ureterolithiasis, but does demonstrate mild hydronephrosis and stranding from possibly recently passed stone.  He describes dribbling  urine and his symptoms may be due to BPH and urinary retention that caused the hydronephrosis.  He indicates that he is not willing to stay for further work-up or management in the ED and demands to go home.  He reports his symptoms are better and he is agreeable to take salt tablets before discharge.  We thoroughly discussed outpatient management and return precautions for the ED.  He continues to deny the possibility of inpatient medical management.  I provide him with information as an outpatient for urology follow-up.  Patient medically stable for discharge.   Clinical Course as of Sep 23 1516  Mon Sep 23, 2020  1513 Reassessed.  Patient reports he feels better and is eager for discharge before I can start to explain the work-up.  We discussed his sodium level, and he reports that it is "always low" and that he has been drinking less Gatorade more water recently.  We discussed his CT that shows mild stranding and swelling of his left ureter that could  be from a recently passed stone.  We discussed the possibility of BPH causing urinary retention.  We discussed following up with a urologist to evaluate for this.  We discussed return precautions for the ED.  He is agreeable to taking a salt tablet before he goes for treatment of his sodium, following up with his PCP this week, establishing with a urologist and return to the ED if his symptoms worsen.  I see no reasons to hold the patient here and he has capacity to make this decision.   [DS]    Clinical Course User Index [DS] Vladimir Crofts, MD    ____________________________________________   FINAL CLINICAL IMPRESSION(S) / ED DIAGNOSES  Final diagnoses:  Left flank pain     ED Discharge Orders    None       Sherell Christoffel Tamala Julian   Note:  This document was prepared using Dragon voice recognition software and may include unintentional dictation errors.   Vladimir Crofts, MD 09/23/20 [phone]

## 2020-09-23 NOTE — ED Notes (Signed)
Pt returned from CT at this time. Ambulated to restroom at this time with no assist. Pt reconnected back to monitor. Urinated in toilet at this time.

## 2020-09-23 NOTE — ED Notes (Signed)
Pt aware that a urine sample is needed. Pt states "I just went when I got in this room, no one told me I needed one, hell they just did my EKG; which you would think they would have done one when I came off the ambulance".

## 2020-09-23 NOTE — Discharge Instructions (Signed)
As we discussed, your sodium levels were low at 122.  We gave you a couple salt tablets to help this.  You may have passed a kidney stone and you may have prostate problems causing you to not pee all the way.  You retaining urine can cause your bladder to swell and cause pain such as this.  I have attached the phone number for a local urologist that you can call the clinic to be seen.  Like we discussed, I would urge you to follow-up with your primary doctor and the urologist this week if you are able to. Please return to the ED with any worsening symptoms.

## 2020-09-23 NOTE — ED Notes (Signed)
Pt to ED today for multiple complaints. States lower abdominal pain, denies N/V/D. States Lt testicle pain, denies scrotal swelling or difficulty urinating. States increased SOB, on O2/[place] 2L all the time. Denies cough. States chest tightness. Resting comfortable in bed watching TV.

## 2020-10-31 ENCOUNTER — Telehealth: Payer: Self-pay | Admitting: Pharmacy Technician

## 2020-11-28 ENCOUNTER — Telehealth: Payer: Self-pay | Admitting: Pulmonary Disease

## 2020-11-28 MED ORDER — DUPIXENT 300 MG/2ML ~~LOC~~ SOAJ
2.0000 mL | SUBCUTANEOUS | 5 refills | Status: DC
Start: 2020-11-28 — End: 2020-12-06

## 2020-11-28 NOTE — Telephone Encounter (Signed)
Spoke with with Patient. Patient informed Dupixent prescription sent to pharmacy.  Advised to give 24 hours before ordering to allow it to be processed. Nothing further at this time.

## 2020-12-06 MED ORDER — DUPIXENT 300 MG/2ML ~~LOC~~ SOAJ
2.0000 mL | SUBCUTANEOUS | 5 refills | Status: DC
Start: 2020-12-06 — End: 2020-12-13

## 2020-12-06 NOTE — Addendum Note (Signed)
Addended by: Elton Sin on: 12/06/2020 02:18 PM   Modules accepted: Orders

## 2020-12-06 NOTE — Telephone Encounter (Signed)
Dupixent prescription sent to [facility] Nothing further at this time.

## 2020-12-06 NOTE — Telephone Encounter (Signed)
Patient has been receiving Dupixent from [facility] patient assistance. Please send in prescription to [facility]

## 2020-12-13 ENCOUNTER — Telehealth: Payer: Self-pay | Admitting: Adult Health

## 2020-12-13 MED ORDER — DUPIXENT 300 MG/2ML ~~LOC~~ SOAJ
2.0000 mL | SUBCUTANEOUS | 5 refills | Status: DC
Start: 2020-12-13 — End: 2021-02-26

## 2020-12-13 NOTE — Telephone Encounter (Signed)
Dupixent pen refill sent to [facility]. Nothing further at this time.

## 2020-12-19 ENCOUNTER — Ambulatory Visit: Payer: Medicare HMO | Admitting: Pulmonary Disease

## 2020-12-19 DIAGNOSIS — Z85828 Personal history of other malignant neoplasm of skin: Secondary | ICD-10-CM | POA: Insufficient documentation

## 2021-01-10 ENCOUNTER — Other Ambulatory Visit
Admission: RE | Admit: 2021-01-10 | Discharge: 2021-01-10 | Disposition: A | Discharge status: Home / Self Care | Payer: Medicare HMO | Attending: Adult Health | Admitting: Adult Health

## 2021-01-10 ENCOUNTER — Ambulatory Visit (INDEPENDENT_AMBULATORY_CARE_PROVIDER_SITE_OTHER): Payer: Medicare HMO | Admitting: Adult Health

## 2021-01-10 ENCOUNTER — Encounter: Payer: Self-pay | Admitting: Adult Health

## 2021-01-10 ENCOUNTER — Other Ambulatory Visit: Payer: Self-pay

## 2021-01-10 DIAGNOSIS — Z148 Genetic carrier of other disease: Secondary | ICD-10-CM | POA: Diagnosis present

## 2021-01-10 DIAGNOSIS — J4540 Moderate persistent asthma, uncomplicated: Secondary | ICD-10-CM | POA: Diagnosis not present

## 2021-01-10 DIAGNOSIS — J449 Chronic obstructive pulmonary disease, unspecified: Secondary | ICD-10-CM

## 2021-01-10 DIAGNOSIS — J9611 Chronic respiratory failure with hypoxia: Secondary | ICD-10-CM | POA: Diagnosis not present

## 2021-01-10 NOTE — Patient Instructions (Addendum)
Continue on Dupixent .  Epi Pen to have if needed for allergic reaction .  Continue on Advair Twice daily  , rinse after use.  Albuterol As needed   Activity as tolerated.  Continue on Oxygen 2l/m  Alpha one level today .  Zyrtec 10mg  At bedtime  As needed   Follow up with Dr. Patsey Berthold in 4-6 months and As needed   Please contact office for sooner follow up if symptoms do not improve or worsen or seek emergency care

## 2021-01-10 NOTE — Assessment & Plan Note (Signed)
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  If is low can discuss restarting replacement therapy  Plan  Patient Instructions  Continue on Dupixent .  Epi Pen to have if needed for allergic reaction .  Continue on Advair Twice daily  , rinse after use.  Albuterol As needed   Activity as tolerated.  Continue on Oxygen 2l/m  Alpha one level today .  Zyrtec 10mg  At bedtime  As needed   Follow up with Dr. Patsey Berthold in 4-6 months and As needed   Please contact office for sooner follow up if symptoms do not improve or worsen or seek emergency care

## 2021-01-10 NOTE — Assessment & Plan Note (Signed)
Improved control since beginning [place].  Has been off of steroids for greater than 6 months. No recent exacerbations.  Continue on trigger prevention and current regimen.  Plan  Patient Instructions  Continue on Dupixent .  Epi Pen to have if needed for allergic reaction .  Continue on Advair Twice daily  , rinse after use.  Albuterol As needed   Activity as tolerated.  Continue on Oxygen 2l/m  Alpha one level today .  Zyrtec 10mg  At bedtime  As needed   Follow up with Dr. Patsey Berthold in 4-6 months and As needed   Please contact office for sooner follow up if symptoms do not improve or worsen or seek emergency care

## 2021-01-10 NOTE — Assessment & Plan Note (Signed)
Continue on oxygen to maintain O2 saturations greater than 88 to 90%. 

## 2021-01-10 NOTE — Progress Notes (Signed)
@Patient  ID: Austin Patterson, male    DOB: 1956-07-08, 65 y.o.   MRN: [id number]  Chief Complaint  Patient presents with  . Follow-up    Referring provider: Laneta Simmers, NP  HPI: 64 year old male former smoker followed for severe COPD with allergic asthma and severe bullous emphysema.  Has a high IgE on Dupixent Known MZ alpha-1 phenotype with normal alpha-1 levels previously on Zemaira  Previously on low dose steroids with prednisone 5mg  , off 04/2020 .   TEST/EVENTS :  FEV1 1.23 L or 39% of predicted noted on PFTs of 04/2019.   IgE at 1,950   01/2020 Alpha 1 103   01/2020 CT chest -No CT findings for pulmonary embolism. Large right bulla  2. Stable tortuosity, mild ectasia and age advanced atherosclerotic calcification involving the aorta and branch vessels including extensive three-vessel coronary artery calcifications. 3. Severe emphysematous changes and pulmonary scarring. No acute overlying pulmonary process. 4. Stable bilateral adrenal gland adenomas. 5. Emphysema and aortic atherosclerosis.  01/10/2021 Follow up : COPD, Asthma Patient returns for 43-month follow-up.  Patient has underlying COPD, severe asthma and emphysema.  Says overall breathing is doing okay, gets winded with activities .  No flare in cough or wheezing . Has tried TRELEGY and BREZTRI, Spiriva but did not like. Remains on Advair Twice daily .  Feels that this works the best for him.  No increased use of albuterol-rarely uses.  Has been on Dupixent since 03/2020 . Feels like it is helping . Previously on prednisone 5mg  daily , weaned off 04/2020.  Has had no exacerbations since last visit.  Remains on Oxygen 2l/m . Doing well with no known issues.  Uses tanks , does not like POC devices .   Has MZ alpha 1 phenotype , previously on Zemaira . Has been off for last 2 yrs .  Last level was 92 10/2019 .   Active , does yard work. Has to rest frequently. Uses riding lawnmower.    Allergies   Allergen Reactions  . Amoxicillin Anaphylaxis  . Tizanidine     Feet and ankle swell     Immunization History  Administered Date(s) Administered  . Influenza Inj Mdck Quad Pf 08/10/2019  . Influenza,inj,Quad PF,6+ Mos 10/15/2015, 08/31/2016  . Influenza-Unspecified 08/10/2019, 07/19/2020  . Moderna SARS-COV2 Booster Vaccination 10/23/2020  . Moderna Sars-Covid-2 Vaccination 11/21/2019, 12/21/2019  . Pneumococcal Polysaccharide-23 10/15/2015    Past Medical History:  Diagnosis Date  . COPD (chronic obstructive pulmonary disease) ([place])   . GERD (gastroesophageal reflux disease)   . Hyperlipidemia   . Hypertension   . Pulmonary fibrosis ([place]) 11/2015    Tobacco History: Social History   Tobacco Use  Smoking Status Former Smoker  . Packs/day: 2.00  . Years: 50.00  . Pack years: 100.00  . Types: Cigarettes  . Quit date: 10/14/2015  . Years since quitting: 5.2  Smokeless Tobacco Former Air traffic controller given: Not Answered   Outpatient Medications Prior to Visit  Medication Sig Dispense Refill  . acetaminophen (TYLENOL) 500 MG tablet Take 1,000 mg by mouth every 6 (six) hours as needed for moderate pain or headache.    . albuterol (VENTOLIN HFA) 108 (90 Base) MCG/ACT inhaler Inhale 2 puffs into the lungs every 6 (six) hours as needed for wheezing or shortness of breath. 8 g 11  . amLODipine (NORVASC) 10 MG tablet Take 1 tablet (10 mg total) by mouth daily. 90 tablet 3  . aspirin EC 81 MG tablet Take  1 tablet (81 mg total) by mouth daily. 90 tablet 3  . atorvastatin (LIPITOR) 40 MG tablet Take 1 tablet (40 mg total) by mouth at bedtime. (Patient taking differently: Take 40 mg by mouth daily.) 90 tablet 3  . clopidogrel (PLAVIX) 75 MG tablet Take 1 tablet (75 mg total) by mouth daily. 90 tablet 3  . Dupilumab (DUPIXENT) 300 MG/2ML SOPN Inject 2 mLs into the skin every 14 (fourteen) days. 4 mL 5  . EPINEPHrine 0.3 mg/0.3 mL IJ SOAJ injection Inject 0.3 mg into the muscle as  needed for anaphylaxis.     [name] HYDROcodone-acetaminophen (NORCO/VICODIN) 5-325 MG tablet Take 1 tablet by mouth every 6 (six) hours as needed for moderate pain. 20 tablet 0  . ipratropium-albuterol (DUONEB) 0.5-2.5 (3) MG/3ML SOLN Take 3 mLs by nebulization 4 (four) times daily as needed (wheezing/shortness of breath). 360 mL 11  . losartan-hydrochlorothiazide (HYZAAR) 100-12.5 MG tablet Take 1 tablet by mouth daily.    . magnesium oxide (MAG-OX) 400 (241.3 Mg) MG tablet Take 1 tablet (400 mg total) by mouth daily. (Patient taking differently: Take 400 mg by mouth daily in the afternoon.) 30 tablet 0  . metoprolol succinate (TOPROL-XL) 50 MG 24 hr tablet Take 1 tablet (50 mg total) by mouth daily. 90 tablet 3  . Multiple Vitamin (MULTIVITAMIN WITH MINERALS) TABS tablet Take 1 tablet by mouth daily. (Patient taking differently: Take 1 tablet by mouth daily in the afternoon.) 30 tablet 0  . pantoprazole (PROTONIX) 40 MG tablet Take 1 tablet (40 mg total) by mouth 2 (two) times daily. 90 tablet 3  . potassium chloride 20 MEQ TBCR Take 20 mEq by mouth daily. (Patient taking differently: Take 20 mEq by mouth daily in the afternoon.) 30 tablet 0  . Spacer/Aero-Holding Chambers (AEROCHAMBER MV) inhaler Use as instructed 1 each 0  . Fluticasone-Salmeterol (ADVAIR DISKUS) 250-50 MCG/DOSE AEPB Inhale 1 puff into the lungs 2 (two) times daily. 60 each 5  . predniSONE 5 MG TBEC Take 1 tablet by mouth daily. (Patient not taking: Reported on 01/10/2021) 30 tablet 1  . Fluticasone-Umeclidin-Vilant (TRELEGY ELLIPTA) 200-62.5-25 MCG/INH AEPB Inhale into the lungs. (Patient not taking: Reported on 01/10/2021)     No facility-administered medications prior to visit.     Review of Systems:   Constitutional:   No  weight loss, night sweats,  Fevers, chills, + fatigue, or  lassitude.  HEENT:   No headaches,  Difficulty swallowing,  Tooth/dental problems, or  Sore throat,                No sneezing, itching, ear ache,   +nasal congestion, post nasal drip,   CV:  No chest pain,  Orthopnea, PND, swelling in lower extremities, anasarca, dizziness, palpitations, syncope.   GI  No heartburn, indigestion, abdominal pain, nausea, vomiting, diarrhea, change in bowel habits, loss of appetite, bloody stools.   Resp:   No chest wall deformity  Skin: no rash or lesions.  GU: no dysuria, change in color of urine, no urgency or frequency.  No flank pain, no hematuria   MS:  No joint pain or swelling.  No decreased range of motion.  No back pain.    Physical Exam  BP 136/82 (BP Location: Left Arm, Cuff Size: Normal)   Pulse 85   Temp (!) 97.5 F (36.4 C) (Temporal)   Ht 5\' 11"  (1.803 m)   Wt 203 lb (92.1 kg)   SpO2 93%   BMI 28.31 kg/m   GEN:  A/Ox3; pleasant , NAD, elderly, on oxygen, wheelchair    HEENT:  [place]/AT,  , NOSE-clear, THROAT-clear, no lesions, no postnasal drip or exudate noted.   NECK:  Supple w/ fair ROM; no JVD; normal carotid impulses w/o bruits; no thyromegaly or nodules palpated; no lymphadenopathy.    RESP  Clear  P & A; w/o, wheezes/ rales/ or rhonchi. no accessory muscle use, no dullness to percussion  CARD:  RRR, no m/r/g, tr peripheral edema, pulses intact, no cyanosis or clubbing.  GI:   Soft & nt; nml bowel sounds; no organomegaly or masses detected.   Musco: Warm bil, no deformities or joint swelling noted.   Neuro: alert, no focal deficits noted.    Skin: Warm, no lesions or rashes    Lab Results:   BNP No results found for: PROBNP  Imaging: No results found.    PFT Results Latest Ref Rng & Units 01/29/2016  FVC-Pre L 2.61  FVC-Predicted Pre % 61  FVC-Post L 2.62  FVC-Predicted Post % 61  Pre FEV1/FVC % % 65  Post FEV1/FCV % % 65  FEV1-Pre L 1.69  FEV1-Predicted Pre % 52  FEV1-Post L 1.70  DLCO uncorrected ml/min/mmHg 7.42  DLCO UNC% % 26  DLVA Predicted % 35    No results found for: NITRICOXIDE      Assessment & Plan:   COPD (chronic  obstructive pulmonary disease) (HCC) Compensated on present regimen.  Plan    Patient Instructions  Continue on Dupixent .  Epi Pen to have if needed for allergic reaction .  Continue on Advair Twice daily  , rinse after use.  Albuterol As needed   Activity as tolerated.  Continue on Oxygen 2l/m  Alpha one level today .  Zyrtec 10mg  At bedtime  As needed   Follow up with Dr. Patsey Berthold in 4-6 months and As needed   Please contact office for sooner follow up if symptoms do not improve or worsen or seek emergency care       Asthma Improved control since beginning [place].  Has been off of steroids for greater than 6 months. No recent exacerbations.  Continue on trigger prevention and current regimen.  Plan  Patient Instructions  Continue on Dupixent .  Epi Pen to have if needed for allergic reaction .  Continue on Advair Twice daily  , rinse after use.  Albuterol As needed   Activity as tolerated.  Continue on Oxygen 2l/m  Alpha one level today .  Zyrtec 10mg  At bedtime  As needed   Follow up with Dr. Patsey Berthold in 4-6 months and As needed   Please contact office for sooner follow up if symptoms do not improve or worsen or seek emergency care       Chronic respiratory failure with hypoxia ([place]) Continue on oxygen to maintain O2 saturations greater than 88 to 90%.  Alpha-1-antitrypsin deficiency carrier Patient with known alpha-1 MZ phenotype-previously on replacement therapy has been off for greater than 2 years Check alpha-1 level today.  If is low can discuss restarting replacement therapy  Plan  Patient Instructions  Continue on Dupixent .  Epi Pen to have if needed for allergic reaction .  Continue on Advair Twice daily  , rinse after use.  Albuterol As needed   Activity as tolerated.  Continue on Oxygen 2l/m  Alpha one level today .  Zyrtec 10mg  At bedtime  As needed   Follow up with Dr. Patsey Berthold in 4-6 months and As needed  Please contact office for sooner  follow up if symptoms do not improve or worsen or seek emergency care          Rexene Edison, NP 01/10/2021

## 2021-01-10 NOTE — Assessment & Plan Note (Signed)
Compensated on present regimen.  Plan    Patient Instructions  Continue on Dupixent .  Epi Pen to have if needed for allergic reaction .  Continue on Advair Twice daily  , rinse after use.  Albuterol As needed   Activity as tolerated.  Continue on Oxygen 2l/m  Alpha one level today .  Zyrtec 10mg  At bedtime  As needed   Follow up with Dr. Patsey Berthold in 4-6 months and As needed   Please contact office for sooner follow up if symptoms do not improve or worsen or seek emergency care

## 2021-01-11 LAB — ALPHA-1-ANTITRYPSIN: A-1 Antitrypsin, Ser: 90 mg/dL — ABNORMAL LOW (ref 101–187)

## 2021-01-14 NOTE — Progress Notes (Signed)
Patient returned phone call, identity confirmed. Went over lab results per T. Parrett NP with patient. All questions answered and patient expressed full understanding. Scheduled office visit with Dr Patsey Berthold 04/09/2021 at 9:30am at the [facility] office per patient request of wanting to schedule in June. Patient agreeable to time, date, and location. Nothing further needed at this time.

## 2021-01-14 NOTE — Progress Notes (Signed)
Called and left message on voicemail to please return phone call to go over lab results. Contact number provided.

## 2021-01-14 NOTE — Progress Notes (Signed)
Agree with the details of the visit as noted by Tammy Parrett, NP.  C. Laura Esley Brooking, MD [facility] 

## 2021-02-04 ENCOUNTER — Emergency Department: Payer: Medicare HMO

## 2021-02-04 ENCOUNTER — Emergency Department
Admission: EM | Admit: 2021-02-04 | Discharge: 2021-02-04 | Disposition: A | Discharge status: Home / Self Care | Payer: Medicare HMO | Attending: Emergency Medicine | Admitting: Emergency Medicine

## 2021-02-04 ENCOUNTER — Other Ambulatory Visit: Payer: Self-pay

## 2021-02-04 DIAGNOSIS — Z955 Presence of coronary angioplasty implant and graft: Secondary | ICD-10-CM | POA: Diagnosis not present

## 2021-02-04 DIAGNOSIS — J441 Chronic obstructive pulmonary disease with (acute) exacerbation: Secondary | ICD-10-CM | POA: Diagnosis not present

## 2021-02-04 DIAGNOSIS — J45909 Unspecified asthma, uncomplicated: Secondary | ICD-10-CM | POA: Diagnosis not present

## 2021-02-04 DIAGNOSIS — I10 Essential (primary) hypertension: Secondary | ICD-10-CM | POA: Insufficient documentation

## 2021-02-04 DIAGNOSIS — Z7902 Long term (current) use of antithrombotics/antiplatelets: Secondary | ICD-10-CM | POA: Insufficient documentation

## 2021-02-04 DIAGNOSIS — R0602 Shortness of breath: Secondary | ICD-10-CM | POA: Diagnosis present

## 2021-02-04 DIAGNOSIS — Z87891 Personal history of nicotine dependence: Secondary | ICD-10-CM | POA: Diagnosis not present

## 2021-02-04 DIAGNOSIS — Z7951 Long term (current) use of inhaled steroids: Secondary | ICD-10-CM | POA: Insufficient documentation

## 2021-02-04 DIAGNOSIS — I2510 Atherosclerotic heart disease of native coronary artery without angina pectoris: Secondary | ICD-10-CM | POA: Diagnosis not present

## 2021-02-04 DIAGNOSIS — Z79899 Other long term (current) drug therapy: Secondary | ICD-10-CM | POA: Diagnosis not present

## 2021-02-04 DIAGNOSIS — Z7982 Long term (current) use of aspirin: Secondary | ICD-10-CM | POA: Insufficient documentation

## 2021-02-04 LAB — BASIC METABOLIC PANEL
Anion gap: 12 (ref 5–15)
BUN: 19 mg/dL (ref 8–23)
CO2: 27 mmol/L (ref 22–32)
Calcium: 9.2 mg/dL (ref 8.9–10.3)
Chloride: 95 mmol/L — ABNORMAL LOW (ref 98–111)
Creatinine, Ser: 0.73 mg/dL (ref 0.61–1.24)
GFR, Estimated: >60 mL/min (ref >60)
Glucose, Bld: 127 mg/dL — ABNORMAL HIGH (ref 70–99)
Potassium: 4.3 mmol/L (ref 3.5–5.1)
Sodium: 134 mmol/L — ABNORMAL LOW (ref 135–145)

## 2021-02-04 LAB — CBC
HCT: 37.5 % — ABNORMAL LOW (ref 39.0–52.0)
Hemoglobin: 13 g/dL (ref 13.0–17.0)
MCH: 34.2 pg — ABNORMAL HIGH (ref 26.0–34.0)
MCHC: 34.7 g/dL (ref 30.0–36.0)
MCV: 98.7 fL (ref 80.0–100.0)
Platelets: 296 10*3/uL (ref 150–400)
RBC: 3.8 MIL/uL — ABNORMAL LOW (ref 4.22–5.81)
RDW: 13 % (ref 11.5–15.5)
WBC: 9.2 10*3/uL (ref 4.0–10.5)
nRBC: 0 % (ref 0.0–0.2)

## 2021-02-04 LAB — TROPONIN I (HIGH SENSITIVITY): Troponin I (High Sensitivity): 4 ng/L (ref <18)

## 2021-02-04 MED ORDER — PREDNISONE 50 MG PO TABS: 50.0000 mg | ORAL_TABLET | Freq: Every day | ORAL | 0 refills | Status: DC

## 2021-02-04 MED ORDER — IPRATROPIUM-ALBUTEROL 0.5-2.5 (3) MG/3ML IN SOLN
3.0000 mL | Freq: Once | RESPIRATORY_TRACT | Status: AC
  Administered 2021-02-04: 3 mL via RESPIRATORY_TRACT
  Filled 2021-02-04: qty 3

## 2021-02-04 MED ORDER — METHYLPREDNISOLONE SODIUM SUCC 125 MG IJ SOLR
125.0000 mg | Freq: Once | INTRAMUSCULAR | Status: AC
  Administered 2021-02-04: 125 mg via INTRAVENOUS
  Filled 2021-02-04: qty 2

## 2021-02-04 NOTE — ED Triage Notes (Signed)
Pt comes with c/o SOB that started for last few days. Pt states hx of COPD. Pt states some CP that started on Saturday. Pt states pain to left side.

## 2021-02-04 NOTE — ED Provider Notes (Signed)
[facility] Emergency Department Provider Note   ____________________________________________    I have reviewed the triage vital signs and the nursing notes.   HISTORY  Chief Complaint Shortness of Breath and Chest Pain     HPI Austin Patterson is a 65 y.o. male with history of COPD who presents with complaints of shortness of breath.  Patient reports chronic home oxygen use between 2 to 4 L.  He reports over the last several months he feels this is been worsening somewhat.  He does follow with pulmonology.  He denies chest pain, occasional cough, no fevers or chills or chest pain.  No calf pain or leg swelling  Past Medical History:  Diagnosis Date  . COPD (chronic obstructive pulmonary disease) ([place])   . GERD (gastroesophageal reflux disease)   . Hyperlipidemia   . Hypertension   . Pulmonary fibrosis ([place]) 11/2015    Patient Active Problem List   Diagnosis Date Noted  . Alpha-1-antitrypsin deficiency carrier 01/10/2021  . Asthma 03/12/2020  . Chronic respiratory failure with hypoxia ([place]) 03/12/2020  . Hyperlipidemia   . COPD (chronic obstructive pulmonary disease) ([place])   . Bleeding nose   . Alcohol abuse   . RUQ abdominal pain   . Anaphylaxis 11/01/2019  . Hypotension 11/01/2019  . CAD (coronary artery disease) 11/01/2019  . H/O heart artery stent 11/01/2019  . Acute on chronic respiratory failure with hypoxia ([place])   . RUQ pain   . Essential hypertension   . IPF (idiopathic pulmonary fibrosis) ([place]) 08/30/2019  . Hypomagnesemia 03/31/2017  . Hiatal hernia   . Reflux esophagitis   . Hematemesis without nausea   . Hyponatremia 09/21/2016  . Multifocal pneumonia 12/09/2015    Past Surgical History:  Procedure Laterality Date  . COLONOSCOPY    . CORONARY STENT PLACEMENT    . ESOPHAGOGASTRODUODENOSCOPY (EGD) WITH PROPOFOL N/A 09/23/2016   Procedure: ESOPHAGOGASTRODUODENOSCOPY (EGD) WITH PROPOFOL;  Surgeon: [name], MD;   Location: [facility] ENDOSCOPY;  Service: Endoscopy;  Laterality: N/A;  . KYPHOPLASTY N/A 03/14/2020   Procedure: T7 & T11 KYPHOPLASTY;  Surgeon: Hessie Knows, MD;  Location: [facility] ORS;  Service: Orthopedics;  Laterality: N/A;  . SHOULDER ACROMIOPLASTY      Prior to Admission medications   Medication Sig Start Date End Date Taking? Authorizing Provider  predniSONE (DELTASONE) 50 MG tablet Take 1 tablet (50 mg total) by mouth daily with breakfast. 02/04/21  Yes Lavonia Drafts, MD  acetaminophen (TYLENOL) 500 MG tablet Take 1,000 mg by mouth every 6 (six) hours as needed for moderate pain or headache.    [provider]  albuterol (VENTOLIN HFA) 108 (90 Base) MCG/ACT inhaler Inhale 2 puffs into the lungs every 6 (six) hours as needed for wheezing or shortness of breath. 01/29/20   Danford, Suann Larry, MD  amLODipine (NORVASC) 10 MG tablet Take 1 tablet (10 mg total) by mouth daily. 08/19/16   Tawni Millers, MD  aspirin EC 81 MG tablet Take 1 tablet (81 mg total) by mouth daily. 08/19/16   Tawni Millers, MD  atorvastatin (LIPITOR) 40 MG tablet Take 1 tablet (40 mg total) by mouth at bedtime. Patient taking differently: Take 40 mg by mouth daily. 08/19/16   Tawni Millers, MD  clopidogrel (PLAVIX) 75 MG tablet Take 1 tablet (75 mg total) by mouth daily. 08/19/16   Tawni Millers, MD  Dupilumab (DUPIXENT) 300 MG/2ML SOPN Inject 2 mLs into the skin every 14 (fourteen) days. 12/13/20  Tyler Pita, MD  EPINEPHrine 0.3 mg/0.3 mL IJ SOAJ injection Inject 0.3 mg into the muscle as needed for anaphylaxis.     [provider]  Fluticasone-Salmeterol (ADVAIR DISKUS) 250-50 MCG/DOSE AEPB Inhale 1 puff into the lungs 2 (two) times daily. 10/31/19 10/30/20  Tyler Pita, MD  HYDROcodone-acetaminophen (NORCO/VICODIN) 5-325 MG tablet Take 1 tablet by mouth every 6 (six) hours as needed for moderate pain. 03/14/20   Hessie Knows, MD  ipratropium-albuterol (DUONEB) 0.5-2.5 (3) MG/3ML SOLN Take 3  mLs by nebulization 4 (four) times daily as needed (wheezing/shortness of breath). 01/29/20   Danford, Suann Larry, MD  losartan-hydrochlorothiazide (HYZAAR) 100-12.5 MG tablet Take 1 tablet by mouth daily. 11/05/19   [provider]  magnesium oxide (MAG-OX) 400 (241.3 Mg) MG tablet Take 1 tablet (400 mg total) by mouth daily. Patient taking differently: Take 400 mg by mouth daily in the afternoon. 06/18/17   Hillary Bow, MD  metoprolol succinate (TOPROL-XL) 50 MG 24 hr tablet Take 1 tablet (50 mg total) by mouth daily. 08/19/16   Tawni Millers, MD  Multiple Vitamin (MULTIVITAMIN WITH MINERALS) TABS tablet Take 1 tablet by mouth daily. Patient taking differently: Take 1 tablet by mouth daily in the afternoon. 09/24/16   Fritzi Mandes, MD  pantoprazole (PROTONIX) 40 MG tablet Take 1 tablet (40 mg total) by mouth 2 (two) times daily. 09/23/16   Fritzi Mandes, MD  potassium chloride 20 MEQ TBCR Take 20 mEq by mouth daily. Patient taking differently: Take 20 mEq by mouth daily in the afternoon. 06/18/17   Hillary Bow, MD  Spacer/Aero-Holding Chambers (AEROCHAMBER MV) inhaler Use as instructed 10/05/19   Tyler Pita, MD     Allergies Amoxicillin and Tizanidine  Family History  Problem Relation Age of Onset  . Heart disease Mother     Social History Social History   Tobacco Use  . Smoking status: Former Smoker    Packs/day: 2.00    Years: 50.00    Pack years: 100.00    Types: Cigarettes    Quit date: 10/14/2015    Years since quitting: 5.3  . Smokeless tobacco: Former Network engineer  . Vaping Use: Never used  Substance Use Topics  . Alcohol use: Yes    Alcohol/week: 28.0 standard drinks    Types: 28 Cans of beer per week    Comment: occ  . Drug use: No    Review of Systems  Constitutional: No fever/chills Eyes: No visual changes.  ENT: No sore throat. Cardiovascular: As above Respiratory: As above Gastrointestinal: No abdominal pain.  No nausea, no  vomiting.   Genitourinary: Negative for dysuria. Musculoskeletal: Negative for back pain. Skin: Negative for rash. Neurological: Negative for headaches or weakness   ____________________________________________   PHYSICAL EXAM:  VITAL SIGNS: ED Triage Vitals  Enc Vitals Group     BP 02/04/21 0957 (!) 149/83     Pulse Rate 02/04/21 0957 91     Resp 02/04/21 0957 20     Temp 02/04/21 0957 97.9 F (36.6 C)     Temp Source 02/04/21 0957 Oral     SpO2 02/04/21 0957 98 %     Weight 02/04/21 0949 92.1 kg (203 lb)     Height 02/04/21 0949 1.702 m (5\' 7" )     Head Circumference --      Peak Flow --      Pain Score 02/04/21 0948 4     Pain Loc --  Pain Edu? --      Excl. in [place]? --     Constitutional: Alert and oriented.  Eyes: Conjunctivae are normal.   Nose: No congestion/rhinnorhea. Mouth/Throat: Mucous membranes are moist.   Neck:  Painless ROM Cardiovascular: Normal rate, regular rhythm. Grossly normal heart sounds.  Good peripheral circulation. Respiratory: Mild tachypnea t.  No retractions, scattered wheezes Gastrointestinal: Soft and nontender. No distention.  No CVA tenderness.  Musculoskeletal: No lower extremity tenderness nor edema.  Warm and well perfused Neurologic:  Normal speech and language. No gross focal neurologic deficits are appreciated.  Skin:  Skin is warm, dry and intact. No rash noted. Psychiatric: Mood and affect are normal. Speech and behavior are normal.  ____________________________________________   LABS (all labs ordered are listed, but only abnormal results are displayed)  Labs Reviewed  BASIC METABOLIC PANEL - Abnormal; Notable for the following components:      Result Value   Sodium 134 (*)    Chloride 95 (*)    Glucose, Bld 127 (*)    All other components within normal limits  CBC - Abnormal; Notable for the following components:   RBC 3.80 (*)    HCT 37.5 (*)    MCH 34.2 (*)    All other components within normal limits   TROPONIN I (HIGH SENSITIVITY)   ____________________________________________  EKG  ED ECG REPORT I, Lavonia Drafts, the attending physician, personally viewed and interpreted this ECG.  Date: 02/04/2021  Rhythm: normal sinus rhythm QRS Axis: normal Intervals: normal ST/T Wave abnormalities: normal Narrative Interpretation: no evidence of acute ischemia  ____________________________________________  RADIOLOGY  Chest x-ray viewed by me, changes of COPD ____________________________________________   PROCEDURES  Procedure(s) performed: No  Procedures   Critical Care performed: No ____________________________________________   INITIAL IMPRESSION / ASSESSMENT AND PLAN / ED COURSE  Pertinent labs & imaging results that were available during my care of the patient were reviewed by me and considered in my medical decision making (see chart for details).  Patient presents with shortness of breath as described above, oxygen saturations are reassuring here, scattered wheezes on exam consistent with COPD exacerbation, pneumonia is also on the differential.  Chest x-ray is reassuring, no evidence of pneumonia/infiltrate/effusion.  Patient treated with IV Solu-Medrol as well as multiple duo nebs with significant improvement.  Discussed admission with the patient however he feels much better has oxygen, nebulizer and will Rx steroids for him at home, he notes that he will return if any worsening.    ____________________________________________   FINAL CLINICAL IMPRESSION(S) / ED DIAGNOSES  Final diagnoses:  COPD exacerbation ([place])        Note:  This document was prepared using Dragon voice recognition software and may include unintentional dictation errors.   Lavonia Drafts, MD 02/04/21 1159

## 2021-02-04 NOTE — ED Notes (Signed)
Pt c/o increasing SOB, on 3L chronic O2, also c/o abd pain and bloating at this time. Denies N/V/D at this time. PT A&O x4.

## 2021-02-06 ENCOUNTER — Encounter: Payer: Self-pay | Admitting: Pulmonary Disease

## 2021-02-06 ENCOUNTER — Telehealth: Payer: Self-pay | Admitting: Pulmonary Disease

## 2021-02-06 NOTE — Telephone Encounter (Signed)
Switching to the pen is okay.  Company know that there is no adverse effect.

## 2021-02-06 NOTE — Telephone Encounter (Signed)
Received letter from [employer] stating that patient had an adverse event while receiving dupixent.   Spoke to patient, who stated that he is doing well with dupixent. Patient stated that the first time he injected himself he did not get all of the medication and re injected the needle and the needle bent.  He would like to switch to the pen.   Dr. Patsey Berthold, please advise. thanks

## 2021-02-06 NOTE — Telephone Encounter (Signed)
Lm for patient.  

## 2021-02-07 NOTE — Telephone Encounter (Signed)
Lm for patient.  

## 2021-02-07 NOTE — Telephone Encounter (Signed)
Spoke to patient and relayed below message/recommendations. patient stated that he has a shipment of pens on the way.  Nothing further needed at this time.

## 2021-02-07 NOTE — Telephone Encounter (Signed)
Lm x2 for patient. Will call once more sue to nature of call.

## 2021-02-07 NOTE — Telephone Encounter (Signed)
Patient is returning phone call. Patient phone number is [phone].

## 2021-02-21 NOTE — Telephone Encounter (Signed)
error 

## 2021-02-26 ENCOUNTER — Telehealth: Payer: Self-pay | Admitting: Pulmonary Disease

## 2021-02-26 MED ORDER — DUPIXENT 300 MG/2ML ~~LOC~~ SOAJ: 2.0000 mL | SUBCUTANEOUS | 3 refills | Status: DC

## 2021-02-26 NOTE — Telephone Encounter (Signed)
90 day supply of Dupixent has been sent to [facility].  Patient is aware and voiced his understanding.  Nothing further needed at this time.

## 2021-04-09 ENCOUNTER — Ambulatory Visit (INDEPENDENT_AMBULATORY_CARE_PROVIDER_SITE_OTHER): Payer: Medicare HMO | Admitting: Pulmonary Disease

## 2021-04-09 ENCOUNTER — Other Ambulatory Visit
Admission: RE | Admit: 2021-04-09 | Discharge: 2021-04-09 | Disposition: A | Discharge status: Home / Self Care | Payer: Medicare HMO | Attending: Pulmonary Disease | Admitting: Pulmonary Disease

## 2021-04-09 ENCOUNTER — Encounter: Payer: Self-pay | Admitting: Pulmonary Disease

## 2021-04-09 ENCOUNTER — Other Ambulatory Visit: Payer: Self-pay

## 2021-04-09 VITALS — BP 134/72 | HR 80 | Temp 97.6°F | Ht 67.0 in | Wt 203.0 lb

## 2021-04-09 DIAGNOSIS — J8410 Pulmonary fibrosis, unspecified: Secondary | ICD-10-CM

## 2021-04-09 DIAGNOSIS — Z148 Genetic carrier of other disease: Secondary | ICD-10-CM | POA: Diagnosis not present

## 2021-04-09 DIAGNOSIS — J449 Chronic obstructive pulmonary disease, unspecified: Secondary | ICD-10-CM

## 2021-04-09 DIAGNOSIS — J9611 Chronic respiratory failure with hypoxia: Secondary | ICD-10-CM

## 2021-04-09 DIAGNOSIS — Z87891 Personal history of nicotine dependence: Secondary | ICD-10-CM

## 2021-04-09 NOTE — Patient Instructions (Signed)
Continue taking your Dupixent.  We are checking another alpha level.   We will see him in follow-up in 3 months time call sooner should any new problems arise.

## 2021-04-09 NOTE — Progress Notes (Signed)
Subjective:    Patient ID: Austin Patterson, male    DOB: [birth year], 65 y.o.   MRN: [id number]  Chief Complaint  Patient presents with   Follow-up    C/o sob with exertion.   HPI Patient is a 65 year old with COPD FEV1 1.23 L or 39% of predicted noted on PFTs of 04/2019.  Patient also has a reversible airways component, IgE at 1,950 and therefore an asthma component.  He has IgE directed to Aspergillus fumigatus.  ABPA in the differential. 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  He is currently on Dupixent and notes that his breathing is actually improved on this.  Have been able to wean him off steroids. He is getting Dupixent at home, self-administered.  Previously, he noted marked improvement on his back pain after he underwent kyphoplasty for 1 and T11 compression fractures, he continues to have lasting relief.   Been doing very well with regards to dyspnea.  Able to perform his activities of daily living with minimal effort.  He does note increased dyspnea with humidity and heat but otherwise doing relatively well.  He did have a visit to the emergency room on 19 April but did not require admission.  He was labeled as a COPD exacerbation.  He states that patient's medications helped him.  Overall he feels that he is doing well.   In the past he was on Zemaira for alpha-1 however he never met criteria for supplementation given his normal alpha-1 antitrypsin levels.  Level must be 50 or below to meet criteria on MZ phenotype.   TEST/EVENTS :  FEV1 1.23 L or 39% of predicted noted on PFTs of 04/2019.  IgE at 1,950  01/2020 Alpha-1 103  01/2020 CT chest -No CT findings for pulmonary embolism. Large right bull On Dupixent since 03/2020 . Feels like it is helping.  Review of Systems A 10 point review of systems was  performed and it is as noted above otherwise negative.  Patient Active Problem List   Diagnosis Date Noted   Alpha-1-antitrypsin deficiency carrier 01/10/2021   Asthma 03/12/2020   Chronic respiratory failure with hypoxia ([place]) 03/12/2020   Hyperlipidemia    COPD (chronic obstructive pulmonary disease) (HCC)    Bleeding nose    Alcohol abuse    RUQ abdominal pain    Anaphylaxis 11/01/2019   Hypotension 11/01/2019   CAD (coronary artery disease) 11/01/2019   H/O heart artery stent 11/01/2019   Acute on chronic respiratory failure with hypoxia (HCC)    RUQ pain    Essential hypertension    IPF (idiopathic pulmonary fibrosis) ([place]) 08/30/2019   Hypomagnesemia 03/31/2017   Hiatal hernia    Reflux esophagitis    Hematemesis without nausea    Hyponatremia 09/21/2016   Multifocal pneumonia 12/09/2015   Social History   Tobacco Use   Smoking status: Former    Packs/day: 2.00    Years: 50.00    Pack years: 100.00    Types: Cigarettes    Quit date: 10/14/2015    Years since quitting: 5.4   Smokeless tobacco: Former  Substance Use Topics   Alcohol use: Yes    Alcohol/week: 28.0 standard drinks    Types: 28 Cans of beer per week    Comment: occ   Allergies  Allergen Reactions   Amoxicillin Anaphylaxis   Tizanidine     Feet  and ankle swell    Current Meds  Medication Sig   acetaminophen (TYLENOL) 500 MG tablet Take 1,000 mg by mouth every 6 (six) hours as needed for moderate pain or headache.   albuterol (VENTOLIN HFA) 108 (90 Base) MCG/ACT inhaler Inhale 2 puffs into the lungs every 6 (six) hours as needed for wheezing or shortness of breath.   amLODipine (NORVASC) 10 MG tablet Take 1 tablet (10 mg total) by mouth daily.   aspirin EC 81 MG tablet Take 1 tablet (81 mg total) by mouth daily.   atorvastatin (LIPITOR) 40 MG tablet Take 1 tablet (40 mg total) by mouth at bedtime. (Patient taking differently: Take 40 mg by mouth daily.)   clopidogrel (PLAVIX) 75 MG tablet Take  1 tablet (75 mg total) by mouth daily.   Dupilumab (DUPIXENT) 300 MG/2ML SOPN Inject 2 mLs into the skin every 14 (fourteen) days.   EPINEPHrine 0.3 mg/0.3 mL IJ SOAJ injection Inject 0.3 mg into the muscle as needed for anaphylaxis.    HYDROcodone-acetaminophen (NORCO/VICODIN) 5-325 MG tablet Take 1 tablet by mouth every 6 (six) hours as needed for moderate pain.   ipratropium-albuterol (DUONEB) 0.5-2.5 (3) MG/3ML SOLN Take 3 mLs by nebulization 4 (four) times daily as needed (wheezing/shortness of breath).   losartan-hydrochlorothiazide (HYZAAR) 100-12.5 MG tablet Take 1 tablet by mouth daily.   magnesium oxide (MAG-OX) 400 (241.3 Mg) MG tablet Take 1 tablet (400 mg total) by mouth daily. (Patient taking differently: Take 400 mg by mouth daily in the afternoon.)   metoprolol succinate (TOPROL-XL) 50 MG 24 hr tablet Take 1 tablet (50 mg total) by mouth daily.   Multiple Vitamin (MULTIVITAMIN WITH MINERALS) TABS tablet Take 1 tablet by mouth daily. (Patient taking differently: Take 1 tablet by mouth daily in the afternoon.)   pantoprazole (PROTONIX) 40 MG tablet Take 1 tablet (40 mg total) by mouth 2 (two) times daily.   potassium chloride 20 MEQ TBCR Take 20 mEq by mouth daily. (Patient taking differently: Take 20 mEq by mouth daily in the afternoon.)   Spacer/Aero-Holding Chambers (AEROCHAMBER MV) inhaler Use as instructed   Immunization History  Administered Date(s) Administered   Influenza Inj Mdck Quad Pf 08/10/2019   Influenza,inj,Quad PF,6+ Mos 10/15/2015, 08/31/2016   Influenza-Unspecified 08/10/2019, 07/19/2020   Moderna SARS-COV2 Booster Vaccination 10/23/2020   Moderna Sars-Covid-2 Vaccination 11/21/2019, 12/21/2019   Pneumococcal Polysaccharide-23 10/15/2015      Objective:   Physical Exam BP 134/72 (BP Location: Left Arm, Cuff Size: Normal)   Pulse 80   Temp 97.6 F (36.4 C) (Temporal)   Ht _0  (1.702 m)   Wt 203 lb (92.1 kg)   SpO2 91%   BMI 31.79 kg/m   GENERAL:  Chronically ill appearing gentleman, mild use of accessories, presents in transport chair.  Wearing oxygen via nasal cannula.  No acute respiratory distress. No plethora!  In good spirits. HEAD: Normocephalic, atraumatic. EYES: Pupils equal, round, reactive to light.  No scleral icterus. MOUTH: Nose/mouth/throat not examined due to masking requirements for COVID 19. NECK: Supple. No thyromegaly. Trachea midline. No JVD.  No adenopathy. PULMONARY: Increased AP diameter, significant kyphosis, coarse breath sounds, no wheezes.  No rhonchi.  Moving air very well. CARDIOVASCULAR: S1 and S2. Regular rate and rhythm.  Distant heart tones no murmur appreciated. GASTROINTESTINAL: Protuberant abdomen otherwise benign. MUSCULOSKELETAL: No joint deformity, no clubbing, no edema. NEUROLOGIC: No overt focal deficit.  Speech is fluent.  Awake, alert. SKIN: Intact,warm,dry. PSYCH: In good spirits today.  Behavior appropriate.  Assessment & Plan:     ICD-10-CM   1. Asthma-COPD overlap syndrome (HCC)  J44.9 Alpha-1-antitrypsin   Compensated on Dupixent for asthma component Continue and as needed DuoNeb for COPD Follow-up in 4 months time call sooner should any problems arise    2. Chronic respiratory failure with hypoxia (HCC)  J96.11    Compliant with O2 Continue oxygen at 2 L/min    3. 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 Former smoker  Z87.891    No evidence of relapse     Orders Placed This Encounter  Procedures   Alpha-1-antitrypsin    Standing Status:   Future    Number of Occurrences:   1    Standing Expiration Date:   04/09/2022   Discussion:  Chandan is actually doing very well on his current regimen.  His last alpha-1 antitrypsin  level was 92 mg/dL.  We will recheck the level to make sure its not decreasing.  Is to continue with his current inhaler therapy and Dupixent.  We will see him in follow-up in 3 months time he is to contact us prior to that time should any new difficulties arise.   Renold Don, MD [facility]   *This note was dictated using voice recognition software/Dragon.  Despite best efforts to proofread, errors can occur which can change the meaning.  Any change was purely unintentional.

## 2021-04-10 LAB — ALPHA-1-ANTITRYPSIN: A-1 Antitrypsin, Ser: 100 mg/dL — ABNORMAL LOW (ref 101–187)

## 2021-07-20 IMAGING — CT CT ANGIO CHEST
2 of 6 series · 18 of 46 positions shown · IV contrast (APPLIED)
Comparison: 01/28/2020

CLINICAL DATA: Chest and back pain.

EXAM:
CT ANGIOGRAPHY CHEST WITH CONTRAST
TECHNIQUE: Multidetector CT imaging of the chest was performed using the
standard protocol during bolus administration of intravenous
contrast. Multiplanar CT image reconstructions and MIPs were
obtained to evaluate the vascular anatomy.
CONTRAST:  100mL OMNIPAQUE IOHEXOL 350 MG/ML SOLN

[Series 5: thins · axial · 0.77mm/px · z∈[-561,-309]mm · 15 of 276 slices shown]
[im 12/276  lung]
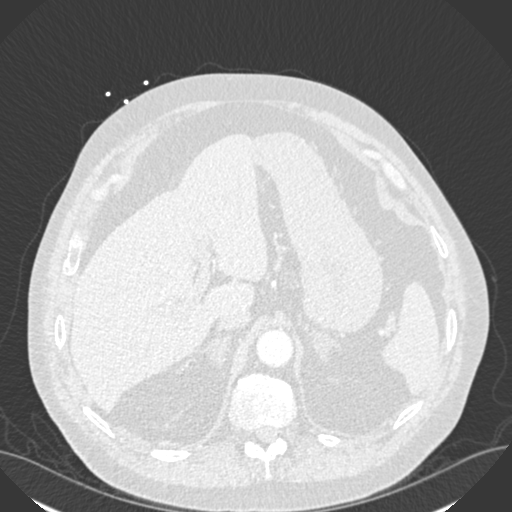
[im 36/276  soft-tissue]
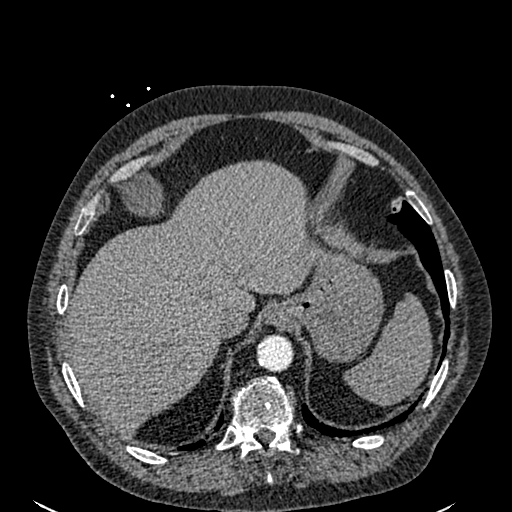
[im 48/276  lung]
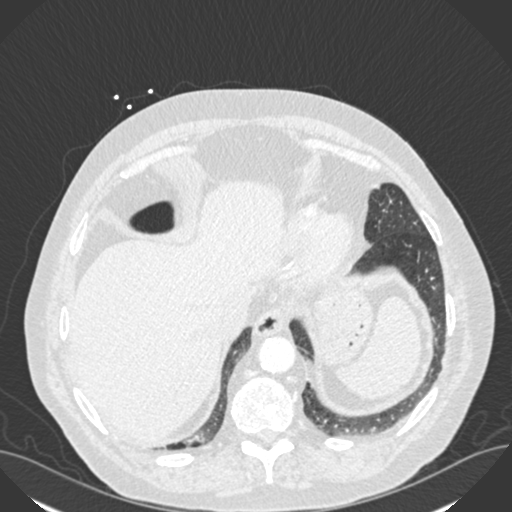
[im 72/276  soft-tissue]
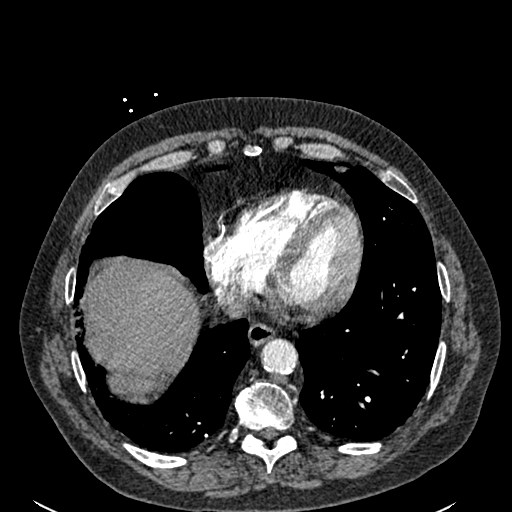
[im 84/276  lung]
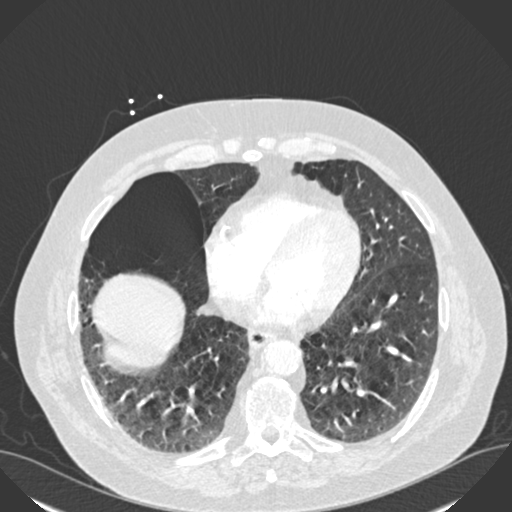
[im 108/276  soft-tissue]
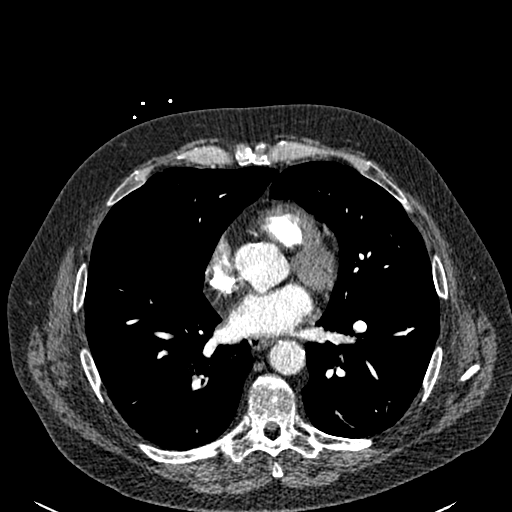
[im 120/276  lung]
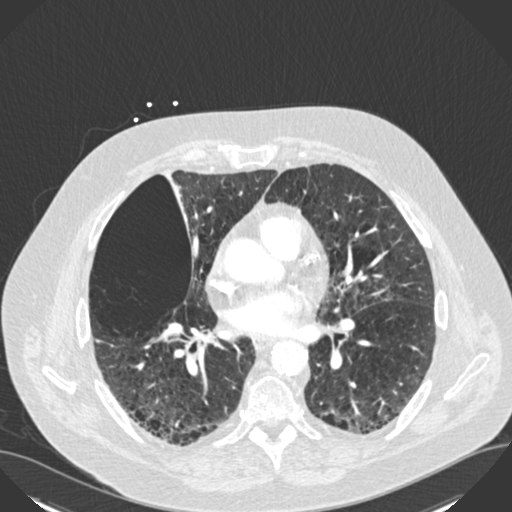
[im 144/276  soft-tissue]
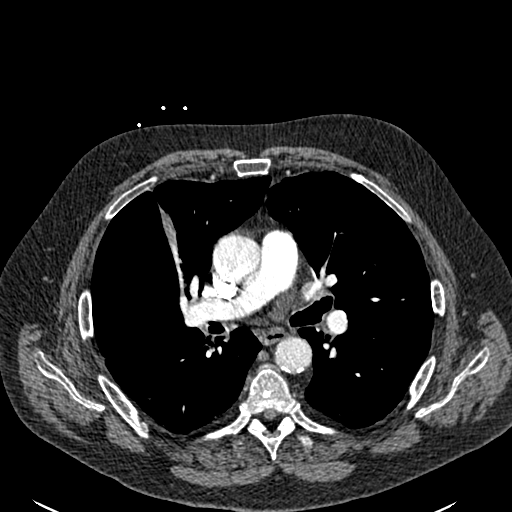
[im 156/276  lung]
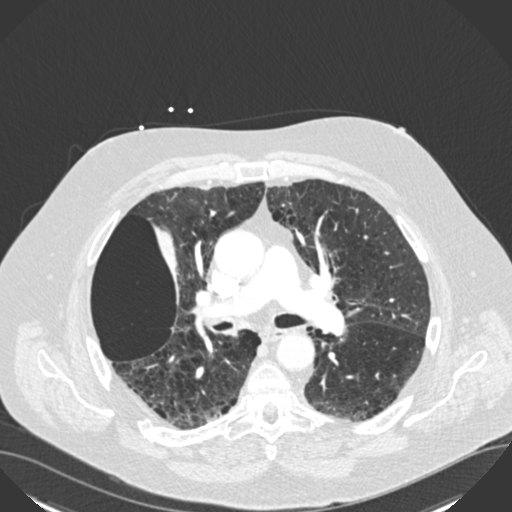
[im 168/276  soft-tissue]
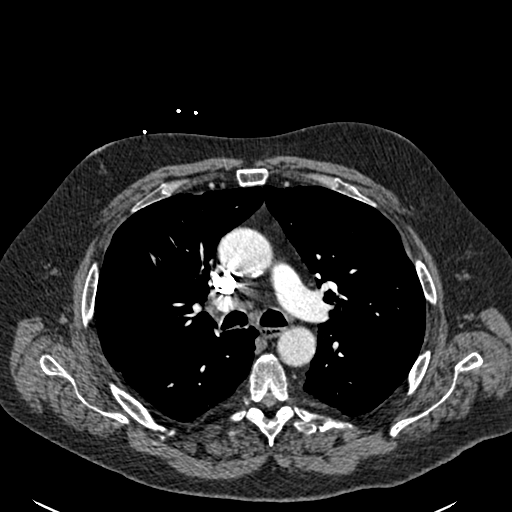
[im 192/276  lung]
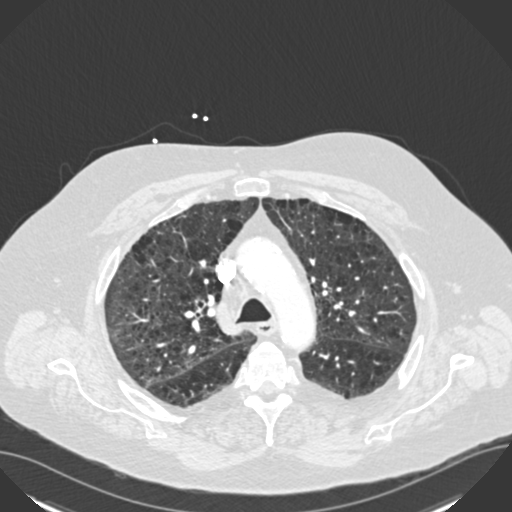
[im 204/276  soft-tissue]
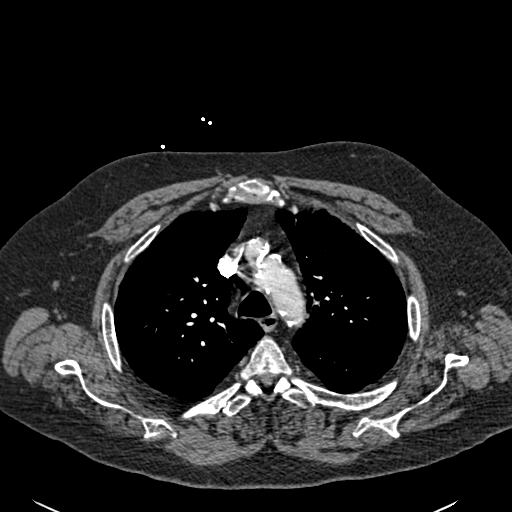
[im 228/276  lung]
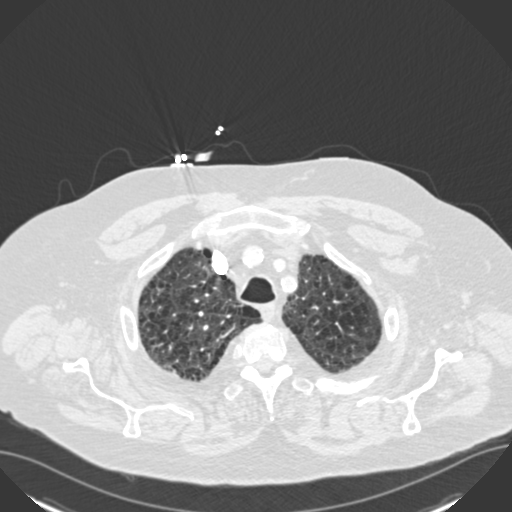
[im 240/276  soft-tissue]
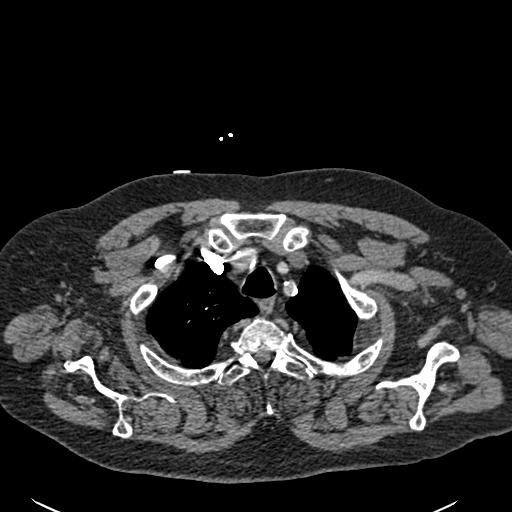
[im 264/276  lung]
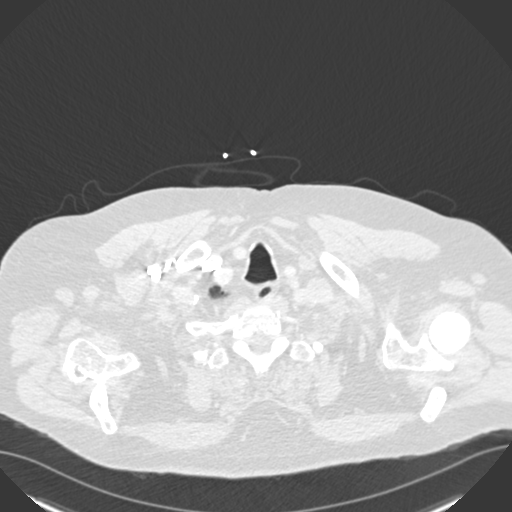

[Series 7: coronal mpr · coronal · 0.56mm/px · 3 of 102 slices shown]
[im 26/102  soft-tissue]
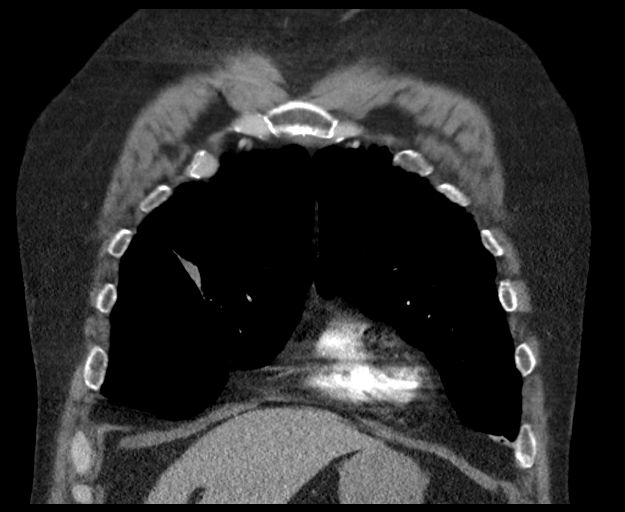
[im 51/102  soft-tissue]
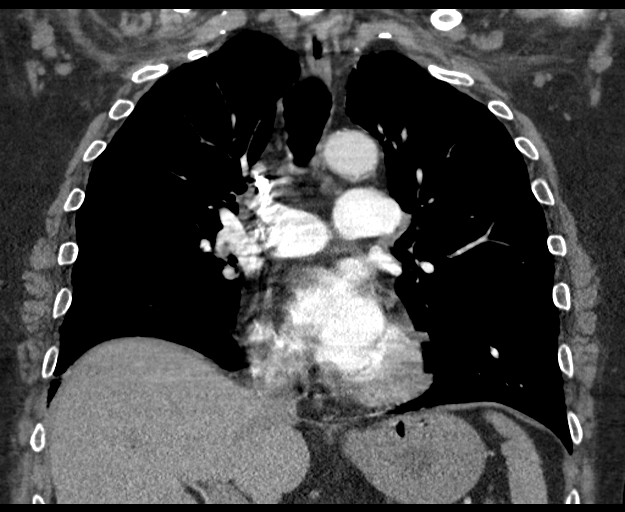
[im 76/102  soft-tissue]
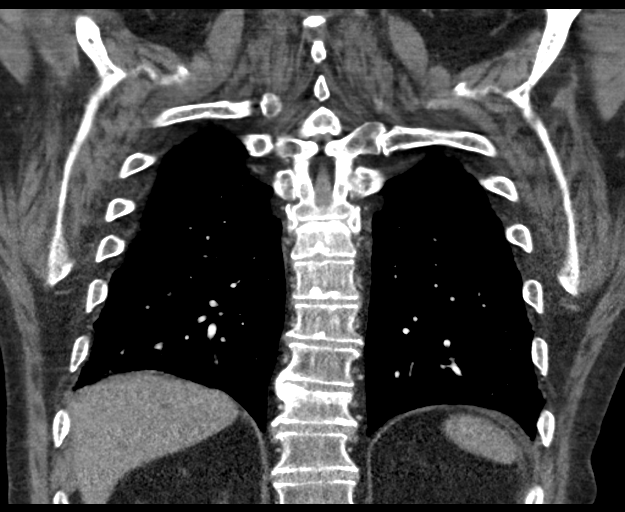

[18 of 46 positions shown; findings below may reference images not displayed]

FINDINGS: Cardiovascular: The heart is normal in size. No pericardial
effusion. There is moderate tortuosity, mild ectasia and age
advanced atherosclerotic calcification involving the aorta and
branch vessels including extensive three-vessel coronary artery
calcifications.

No aortic dissection or focal aneurysm.

The pulmonary arterial tree is fairly well opacified. No filling
defects to suggest pulmonary embolism.

Mediastinum/Nodes: Stable scattered mediastinal and hilar lymph
nodes. No mass or overt adenopathy. The esophagus is grossly normal.

Lungs/Pleura: Severe emphysematous changes and pulmonary scarring.
No acute overlying pulmonary process is identified. Large right
middle lobe bulla is noted with adjacent compressive atelectasis.

No worrisome pulmonary lesions. No pleural effusion.

Upper Abdomen: No significant upper abdominal findings. Stable
bilateral adrenal gland adenomas. Stable abdominal aortic
calcifications.

Musculoskeletal: No chest wall mass, supraclavicular or axillary
adenopathy.

The bony thorax is intact. Stable appearing mild lower thoracic
compression deformities.

Review of the MIP images confirms the above findings.
IMPRESSION: 1. No CT findings for pulmonary embolism.
2. Stable tortuosity, mild ectasia and age advanced atherosclerotic
calcification involving the aorta and branch vessels including
extensive three-vessel coronary artery calcifications.
3. Severe emphysematous changes and pulmonary scarring. No acute
overlying pulmonary process.
4. Stable bilateral adrenal gland adenomas.
5. Emphysema and aortic atherosclerosis.

Aortic Atherosclerosis (ZNZNB-NUV.V) and Emphysema (ZNZNB-G0W.I).

Aortic Atherosclerosis (ZNZNB-NUV.V) and Emphysema (ZNZNB-G0W.I).

## 2021-10-29 ENCOUNTER — Telehealth: Payer: Self-pay

## 2021-10-29 NOTE — Telephone Encounter (Signed)
Submitted a Prior Authorization request to [facility] for [place] via CoverMyMeds. Will update once we receive a response.   Key: [id number]

## 2021-10-30 ENCOUNTER — Ambulatory Visit
Admission: RE | Admit: 2021-10-30 | Discharge: 2021-10-30 | Disposition: A | Discharge status: Home / Self Care | Payer: Medicare HMO | Source: Ambulatory Visit | Attending: Pulmonary Disease | Admitting: Pulmonary Disease

## 2021-10-30 ENCOUNTER — Ambulatory Visit: Payer: Medicare HMO | Admitting: Pulmonary Disease

## 2021-10-30 ENCOUNTER — Other Ambulatory Visit
Admission: RE | Admit: 2021-10-30 | Discharge: 2021-10-30 | Disposition: A | Discharge status: Home / Self Care | Payer: Medicare HMO | Source: Ambulatory Visit | Attending: Pulmonary Disease | Admitting: Pulmonary Disease

## 2021-10-30 ENCOUNTER — Encounter: Payer: Self-pay | Admitting: Pulmonary Disease

## 2021-10-30 ENCOUNTER — Other Ambulatory Visit (HOSPITAL_COMMUNITY): Payer: Self-pay

## 2021-10-30 ENCOUNTER — Other Ambulatory Visit: Payer: Self-pay

## 2021-10-30 VITALS — BP 130/70 | HR 82 | Temp 97.8°F | Ht 67.0 in | Wt 196.0 lb

## 2021-10-30 DIAGNOSIS — R0602 Shortness of breath: Secondary | ICD-10-CM | POA: Diagnosis not present

## 2021-10-30 DIAGNOSIS — J8410 Pulmonary fibrosis, unspecified: Secondary | ICD-10-CM

## 2021-10-30 DIAGNOSIS — Z87891 Personal history of nicotine dependence: Secondary | ICD-10-CM

## 2021-10-30 DIAGNOSIS — J439 Emphysema, unspecified: Secondary | ICD-10-CM | POA: Diagnosis not present

## 2021-10-30 DIAGNOSIS — J441 Chronic obstructive pulmonary disease with (acute) exacerbation: Secondary | ICD-10-CM | POA: Diagnosis not present

## 2021-10-30 MED ORDER — METHYLPREDNISOLONE 4 MG PO TBPK: ORAL_TABLET | ORAL | 0 refills | Status: DC

## 2021-10-30 MED ORDER — LEVOFLOXACIN 500 MG PO TABS: 500.0000 mg | ORAL_TABLET | Freq: Every day | ORAL | 0 refills | Status: AC

## 2021-10-30 NOTE — Progress Notes (Signed)
Subjective:    Patient ID: Austin Patterson, male    DOB: 11-30-1955, 66 y.o.   MRN: [id number] Chief Complaint  Patient presents with   Follow-up   HPI Patient is a 66 year old with COPD (FEV1 1.23 L or 39% predicted, 03/26/2019) and reversible airways component, IgE at 1950 directed to Aspergillus fumigate [place] (ABPA) and underlying pulmonary fibrosis who presents for follow-up.  This is a scheduled visit.  He has chronic respiratory failure with hypoxia due to the issues noted above.  He has alpha-1 antitrypsin phenotype MZ but normal alpha-1 levels.  He is currently on Dupixent for management of his asthma component.  Last evaluated here on 09 April 2021 at that time doing very well.  Today he presents with dyspnea over his baseline however he has had no fevers, chills or sweats.  No increase in cough or sputum production.  No hemoptysis.  He notes that the increase in dyspnea has been gradual.  He notes no other issues.  He has not had any chest pain, no recent sick contacts.  Does not endorse any other symptomatology just feels he has the "blahs".  He attributes this to weather change.  Review of Systems A 10 point review of systems was performed and it is as noted above otherwise negative.  Patient Active Problem List   Diagnosis Date Noted   Alpha-1-antitrypsin deficiency carrier 01/10/2021   Asthma 03/12/2020   Chronic respiratory failure with hypoxia ([place]) 03/12/2020   Hyperlipidemia    COPD (chronic obstructive pulmonary disease) (HCC)    Bleeding nose    Alcohol abuse    RUQ abdominal pain    Anaphylaxis 11/01/2019   Hypotension 11/01/2019   CAD (coronary artery disease) 11/01/2019   H/O heart artery stent 11/01/2019   Acute on chronic respiratory failure with hypoxia (HCC)    RUQ pain    Essential hypertension    IPF (idiopathic pulmonary fibrosis) ([place]) 08/30/2019   Hypomagnesemia 03/31/2017   Hiatal hernia    Reflux esophagitis    Hematemesis without nausea     Hyponatremia 09/21/2016   Multifocal pneumonia 12/09/2015   Social History   Tobacco Use   Smoking status: Former    Packs/day: 2.00    Years: 50.00    Pack years: 100.00    Types: Cigarettes    Quit date: 10/14/2015    Years since quitting: 6.0   Smokeless tobacco: Former  Substance Use Topics   Alcohol use: Yes    Alcohol/week: 28.0 standard drinks    Types: 28 Cans of beer per week    Comment: occ   Allergies  Allergen Reactions   Amoxicillin Anaphylaxis   Tizanidine     Feet and ankle swell    Current Meds  Medication Sig   acetaminophen (TYLENOL) 500 MG tablet Take 1,000 mg by mouth every 6 (six) hours as needed for moderate pain or headache.   albuterol (VENTOLIN HFA) 108 (90 Base) MCG/ACT inhaler Inhale 2 puffs into the lungs every 6 (six) hours as needed for wheezing or shortness of breath.   amLODipine (NORVASC) 10 MG tablet Take 1 tablet (10 mg total) by mouth daily.   aspirin EC 81 MG tablet Take 1 tablet (81 mg total) by mouth daily.   atorvastatin (LIPITOR) 40 MG tablet Take 1 tablet (40 mg total) by mouth at bedtime. (Patient taking differently: Take 40 mg by mouth daily.)   clopidogrel (PLAVIX) 75 MG tablet Take 1 tablet (75 mg total) by mouth daily.  Dupilumab (DUPIXENT) 300 MG/2ML SOPN Inject 2 mLs into the skin every 14 (fourteen) days.   EPINEPHrine 0.3 mg/0.3 mL IJ SOAJ injection Inject 0.3 mg into the muscle as needed for anaphylaxis.    HYDROcodone-acetaminophen (NORCO/VICODIN) 5-325 MG tablet Take 1 tablet by mouth every 6 (six) hours as needed for moderate pain.   ipratropium-albuterol (DUONEB) 0.5-2.5 (3) MG/3ML SOLN Take 3 mLs by nebulization 4 (four) times daily as needed (wheezing/shortness of breath).   losartan-hydrochlorothiazide (HYZAAR) 100-12.5 MG tablet Take 1 tablet by mouth daily.   magnesium oxide (MAG-OX) 400 (241.3 Mg) MG tablet Take 1 tablet (400 mg total) by mouth daily. (Patient taking differently: Take 400 mg by mouth daily in the  afternoon.)   metoprolol succinate (TOPROL-XL) 50 MG 24 hr tablet Take 1 tablet (50 mg total) by mouth daily.   Multiple Vitamin (MULTIVITAMIN WITH MINERALS) TABS tablet Take 1 tablet by mouth daily. (Patient taking differently: Take 1 tablet by mouth daily in the afternoon.)   pantoprazole (PROTONIX) 40 MG tablet Take 1 tablet (40 mg total) by mouth 2 (two) times daily.   potassium chloride 20 MEQ TBCR Take 20 mEq by mouth daily. (Patient taking differently: Take 20 mEq by mouth daily in the afternoon.)   Spacer/Aero-Holding Chambers (AEROCHAMBER MV) inhaler Use as instructed   Immunization History  Administered Date(s) Administered   Influenza Inj Mdck Quad Pf 08/10/2019   Influenza Inj Mdck Quad With Preservative 07/18/2018   Influenza,inj,Quad PF,6+ Mos 10/15/2015, 08/31/2016   Influenza-Unspecified 08/10/2019, 07/19/2020   Moderna SARS-COV2 Booster Vaccination 10/23/2020   Moderna Sars-Covid-2 Vaccination 11/21/2019, 12/21/2019   Pneumococcal Polysaccharide-23 10/15/2015      Objective:   Physical Exam BP 130/70 (BP Location: Left Arm, Patient Position: Sitting, Cuff Size: Normal)    Pulse 82    Temp 97.8 F (36.6 C) (Oral)    Ht 5\' 7"  (1.702 m)    Wt 196 lb (88.9 kg)    SpO2 97%    BMI 30.70 kg/m  GENERAL: Chronically ill appearing gentleman, mild use of accessories, presents in transport chair.  Wearing oxygen via nasal cannula.  No acute respiratory distress.  No plethora.  HEAD: Normocephalic, atraumatic. EYES: Pupils equal, round, reactive to light.  No scleral icterus. MOUTH: Nose/mouth/throat not examined due to masking requirements for COVID 19. NECK: Supple. No thyromegaly. Trachea midline. No JVD.  No adenopathy. PULMONARY: Increased AP diameter, significant kyphosis, coarse breath sounds, no wheezes.  No rhonchi.  Distant breath sounds. CARDIOVASCULAR: S1 and S2. Regular rate and rhythm.  Distant heart tones no murmur appreciated. GASTROINTESTINAL: Protuberant abdomen  otherwise benign. MUSCULOSKELETAL: No joint deformity, no clubbing, no edema. NEUROLOGIC: No overt focal deficit.  Speech is fluent.  Awake, alert. SKIN: Intact,warm,dry. PSYCH: In good spirits today.  Behavior appropriate.   Chest x-ray performed today shows potential developing infiltrates particularly on the left lower lobe:     Assessment & Plan:     ICD-10-CM   1. SOB (shortness of breath)  R06.02 DG Chest 2 View   Chest x-ray today consistent with potential developing pneumonia Medrol Dosepak Levaquin    2. COPD with acute exacerbation (HCC) -possible infiltrate  J44.1    Medrol Dosepak Levaquin 500 mg daily x7 days    3. 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  Former smoker  Z87.891    No evidence of relapse     Orders Placed This Encounter  Procedures   DG Chest 2 View    Standing Status:   Future    Number of Occurrences:   1    Standing Expiration Date:   10/30/2022    Order Specific Question:   Reason for Exam (SYMPTOM  OR DIAGNOSIS REQUIRED)    Answer:   sob    Order Specific Question:   Preferred imaging location?    Answer:   [facility]   Alpha-1-antitrypsin    Standing Status:   Future    Number of Occurrences:   1    Standing Expiration Date:   10/30/2022   Meds ordered this encounter  Medications   methylPREDNISolone (MEDROL DOSEPAK) 4 MG TBPK tablet    Sig: Take as directed in the package.    Dispense:  21 tablet    Refill:  0   levofloxacin (LEVAQUIN) 500 MG tablet    Sig: Take 1 tablet (500 mg total) by mouth daily for 7 days.    Dispense:  7 tablet    Refill:  0   Patient was seen and initially treated only with Medrol however once chest x-ray ordered above was available it is noted that he has some developing infiltrates particular  on the left lower lobe.  Suspect community-acquired pneumonia.  He is allergic to penicillin.  He will be placed on Levaquin.  He is aware of the findings of his CT.  We also are checking alpha-1 levels.  These are checked every 6 months.  He would not require alpha supplementation unless he has 50 mg/dL or below of alpha-1.  She is scheduled to see [place] back in 3 months time however he has been advised to call sooner if his symptoms fail to improve or they worsen in the interim.  Renold Don, MD Advanced [facility]ch[facility]    *This note was dictated using voice recognition software/Dragon.  Despite best efforts to proofread, errors can occur which can change the meaning. Any transcriptional errors that result from this process are unintentional and may not be fully corrected at the time of dictation.

## 2021-10-30 NOTE — Patient Instructions (Signed)
I have sent a Medrol Dosepak (prednisone like medication) to the pharmacy.  We have ordered chest x-ray and an alpha-1 level.  We will see you in follow-up in 3 months time call sooner should any new problems arise.

## 2021-10-30 NOTE — Telephone Encounter (Signed)
Received notification from [facility] regarding a prior authorization for [place]. Authorization has been APPROVED from 10/19/2021 to 10/18/2022.   Per test claim, copay for 28 days supply is $1,266.89  Patient can fill through [facility]: [phone]   Authorization # [id number]   Will send PA approval letter to [place] and scan center for retention.

## 2021-10-31 LAB — ALPHA-1-ANTITRYPSIN: A-1 Antitrypsin, Ser: 111 mg/dL (ref 101–187)

## 2021-11-04 NOTE — Telephone Encounter (Signed)
Called Dupixent [employer] PAP application for renewal status. Per rep, patient was denied pt assistance due to being eligible for Medicaid.  Awaiting denial letter to be faxed to clinic. Patient was contacted today by Dupixent [employer] per rep.  ATC patient to review but unable to reach. Left VM requesting return call to review how many doses he has left and if he needs sample that will have to be picked up in [facility], PharmD, MPH, BCPS Clinical Pharmacist (Rheumatology and Pulmonology)

## 2021-11-19 NOTE — Telephone Encounter (Signed)
Called patient who stated that he was denied Dupixent PAP. I detailed that denial is due to him being eligible for Medicaid, but he states that he does not qualify. I advised him to reach out to [place] to discuss income information because he stated that income has not significantly changed since last year. He will plan to reach out to [place] today or this week.  He states he has one pen remaining. I reviewed that we cannot ship Dupixent sample to his home or to [facility] location and he would have to stop by [facility] clinic to pick up.  Knox Saliva, PharmD, MPH, BCPS Clinical Pharmacist (Rheumatology and Pulmonology)

## 2021-11-21 ENCOUNTER — Other Ambulatory Visit: Payer: Self-pay | Admitting: Physician Assistant

## 2021-11-21 DIAGNOSIS — J189 Pneumonia, unspecified organism: Secondary | ICD-10-CM

## 2021-12-02 NOTE — Telephone Encounter (Signed)
Received a fax from  [place] regarding an approval for [place] patient assistance from 12/02/21 to 10/18/22.   Phone number: [phone], option 1  Called patient to notify. He states that his form has two dependents marked but he does not have. Advised him to call to schedule shipment. He confirmed he has phone numbers needed to schedule shipment.  Knox Saliva, PharmD, MPH, BCPS Clinical Pharmacist (Rheumatology and Pulmonology)

## 2022-01-13 ENCOUNTER — Ambulatory Visit: Payer: Medicare HMO | Admitting: Pulmonary Disease

## 2022-01-24 ENCOUNTER — Emergency Department: Payer: Medicare HMO

## 2022-01-24 ENCOUNTER — Encounter: Payer: Self-pay | Admitting: Emergency Medicine

## 2022-01-24 ENCOUNTER — Observation Stay
Admission: EM | Admit: 2022-01-24 | Discharge: 2022-01-25 | Disposition: A | Discharge status: Home / Self Care | Payer: Medicare HMO | Attending: Family Medicine | Admitting: Family Medicine

## 2022-01-24 ENCOUNTER — Other Ambulatory Visit: Payer: Self-pay

## 2022-01-24 DIAGNOSIS — R079 Chest pain, unspecified: Principal | ICD-10-CM

## 2022-01-24 DIAGNOSIS — Z87891 Personal history of nicotine dependence: Secondary | ICD-10-CM | POA: Diagnosis not present

## 2022-01-24 DIAGNOSIS — J4520 Mild intermittent asthma, uncomplicated: Secondary | ICD-10-CM | POA: Diagnosis not present

## 2022-01-24 DIAGNOSIS — I2510 Atherosclerotic heart disease of native coronary artery without angina pectoris: Secondary | ICD-10-CM | POA: Diagnosis present

## 2022-01-24 DIAGNOSIS — J8410 Pulmonary fibrosis, unspecified: Secondary | ICD-10-CM | POA: Diagnosis present

## 2022-01-24 DIAGNOSIS — K219 Gastro-esophageal reflux disease without esophagitis: Secondary | ICD-10-CM | POA: Insufficient documentation

## 2022-01-24 DIAGNOSIS — Z7982 Long term (current) use of aspirin: Secondary | ICD-10-CM | POA: Diagnosis not present

## 2022-01-24 DIAGNOSIS — J84112 Idiopathic pulmonary fibrosis: Secondary | ICD-10-CM | POA: Diagnosis not present

## 2022-01-24 DIAGNOSIS — R0789 Other chest pain: Secondary | ICD-10-CM | POA: Diagnosis not present

## 2022-01-24 DIAGNOSIS — E785 Hyperlipidemia, unspecified: Secondary | ICD-10-CM | POA: Diagnosis not present

## 2022-01-24 DIAGNOSIS — J9611 Chronic respiratory failure with hypoxia: Secondary | ICD-10-CM

## 2022-01-24 DIAGNOSIS — J439 Emphysema, unspecified: Secondary | ICD-10-CM | POA: Insufficient documentation

## 2022-01-24 DIAGNOSIS — E871 Hypo-osmolality and hyponatremia: Secondary | ICD-10-CM

## 2022-01-24 DIAGNOSIS — Z79899 Other long term (current) drug therapy: Secondary | ICD-10-CM | POA: Insufficient documentation

## 2022-01-24 DIAGNOSIS — Z7951 Long term (current) use of inhaled steroids: Secondary | ICD-10-CM | POA: Diagnosis not present

## 2022-01-24 DIAGNOSIS — J4489 Other specified chronic obstructive pulmonary disease: Secondary | ICD-10-CM | POA: Diagnosis present

## 2022-01-24 DIAGNOSIS — Z8249 Family history of ischemic heart disease and other diseases of the circulatory system: Secondary | ICD-10-CM | POA: Insufficient documentation

## 2022-01-24 DIAGNOSIS — J449 Chronic obstructive pulmonary disease, unspecified: Secondary | ICD-10-CM

## 2022-01-24 DIAGNOSIS — Z955 Presence of coronary angioplasty implant and graft: Secondary | ICD-10-CM | POA: Diagnosis not present

## 2022-01-24 DIAGNOSIS — Z9981 Dependence on supplemental oxygen: Secondary | ICD-10-CM | POA: Insufficient documentation

## 2022-01-24 DIAGNOSIS — F1010 Alcohol abuse, uncomplicated: Secondary | ICD-10-CM

## 2022-01-24 DIAGNOSIS — Z7902 Long term (current) use of antithrombotics/antiplatelets: Secondary | ICD-10-CM | POA: Diagnosis not present

## 2022-01-24 DIAGNOSIS — Z8701 Personal history of pneumonia (recurrent): Secondary | ICD-10-CM | POA: Insufficient documentation

## 2022-01-24 DIAGNOSIS — J45909 Unspecified asthma, uncomplicated: Secondary | ICD-10-CM | POA: Diagnosis present

## 2022-01-24 DIAGNOSIS — I10 Essential (primary) hypertension: Secondary | ICD-10-CM

## 2022-01-24 HISTORY — DX: Chest pain, unspecified: R07.9

## 2022-01-24 LAB — URINE DRUG SCREEN, QUALITATIVE (ARMC ONLY)
Amphetamines, Ur Screen: NOT DETECTED
Barbiturates, Ur Screen: NOT DETECTED
Benzodiazepine, Ur Scrn: NOT DETECTED
Cannabinoid 50 Ng, Ur ~~LOC~~: NOT DETECTED
Cocaine Metabolite,Ur ~~LOC~~: NOT DETECTED
MDMA (Ecstasy)Ur Screen: NOT DETECTED
Methadone Scn, Ur: NOT DETECTED
Opiate, Ur Screen: NOT DETECTED
Phencyclidine (PCP) Ur S: NOT DETECTED
Tricyclic, Ur Screen: NOT DETECTED

## 2022-01-24 LAB — COMPREHENSIVE METABOLIC PANEL
ALT: 27 U/L (ref 0–44)
AST: 29 U/L (ref 15–41)
Albumin: 4.3 g/dL (ref 3.5–5.0)
Alkaline Phosphatase: 41 U/L (ref 38–126)
Anion gap: 10 (ref 5–15)
BUN: 18 mg/dL (ref 8–23)
CO2: 26 mmol/L (ref 22–32)
Calcium: 9.1 mg/dL (ref 8.9–10.3)
Chloride: 91 mmol/L — ABNORMAL LOW (ref 98–111)
Creatinine, Ser: 0.8 mg/dL (ref 0.61–1.24)
GFR, Estimated: >60 mL/min (ref >60)
Glucose, Bld: 121 mg/dL — ABNORMAL HIGH (ref 70–99)
Potassium: 4 mmol/L (ref 3.5–5.1)
Sodium: 127 mmol/L — ABNORMAL LOW (ref 135–145)
Total Bilirubin: 0.8 mg/dL (ref 0.3–1.2)
Total Protein: 7.6 g/dL (ref 6.5–8.1)

## 2022-01-24 LAB — CBC WITH DIFFERENTIAL/PLATELET
Abs Immature Granulocytes: 0.03 10*3/uL (ref 0.00–0.07)
Basophils Absolute: 0 10*3/uL (ref 0.0–0.1)
Basophils Relative: 0 %
Eosinophils Absolute: 0 10*3/uL (ref 0.0–0.5)
Eosinophils Relative: 1 %
HCT: 36.8 % — ABNORMAL LOW (ref 39.0–52.0)
Hemoglobin: 12.3 g/dL — ABNORMAL LOW (ref 13.0–17.0)
Immature Granulocytes: 0 %
Lymphocytes Relative: 14 %
Lymphs Abs: 1 10*3/uL (ref 0.7–4.0)
MCH: 30.7 pg (ref 26.0–34.0)
MCHC: 33.4 g/dL (ref 30.0–36.0)
MCV: 91.8 fL (ref 80.0–100.0)
Monocytes Absolute: 0.7 10*3/uL (ref 0.1–1.0)
Monocytes Relative: 10 %
Neutro Abs: 5.5 10*3/uL (ref 1.7–7.7)
Neutrophils Relative %: 75 %
Platelets: 326 10*3/uL (ref 150–400)
RBC: 4.01 MIL/uL — ABNORMAL LOW (ref 4.22–5.81)
RDW: 15.8 % — ABNORMAL HIGH (ref 11.5–15.5)
WBC: 7.3 10*3/uL (ref 4.0–10.5)
nRBC: 0 % (ref 0.0–0.2)

## 2022-01-24 LAB — BRAIN NATRIURETIC PEPTIDE: B Natriuretic Peptide: 13.4 pg/mL (ref 0.0–100.0)

## 2022-01-24 LAB — TROPONIN I (HIGH SENSITIVITY)
Troponin I (High Sensitivity): 5 ng/L (ref <18)
Troponin I (High Sensitivity): 4 ng/L (ref <18)
Troponin I (High Sensitivity): 7 ng/L (ref <18)
Troponin I (High Sensitivity): 6 ng/L (ref <18)

## 2022-01-24 MED ORDER — IPRATROPIUM-ALBUTEROL 0.5-2.5 (3) MG/3ML IN SOLN
3.0000 mL | Freq: Two times a day (BID) | RESPIRATORY_TRACT | Status: DC
  Administered 2022-01-24 – 2022-01-25 (×2): 3 mL via RESPIRATORY_TRACT
  Filled 2022-01-24 (×2): qty 3

## 2022-01-24 MED ORDER — OYSTER SHELL CALCIUM/D3 500-5 MG-MCG PO TABS
1.0000 | ORAL_TABLET | Freq: Two times a day (BID) | ORAL | Status: DC
  Administered 2022-01-24 – 2022-01-25 (×2): 1 via ORAL
  Filled 2022-01-24 (×2): qty 1

## 2022-01-24 MED ORDER — ALBUTEROL SULFATE (2.5 MG/3ML) 0.083% IN NEBU: 3.0000 mL | INHALATION_SOLUTION | RESPIRATORY_TRACT | Status: DC | PRN

## 2022-01-24 MED ORDER — ACETAMINOPHEN 325 MG PO TABS
650.0000 mg | ORAL_TABLET | Freq: Four times a day (QID) | ORAL | Status: DC | PRN
  Administered 2022-01-24: 650 mg via ORAL
  Filled 2022-01-24: qty 2

## 2022-01-24 MED ORDER — ATORVASTATIN CALCIUM 20 MG PO TABS
40.0000 mg | ORAL_TABLET | Freq: Every day | ORAL | Status: DC
  Administered 2022-01-25: 40 mg via ORAL
  Filled 2022-01-24: qty 2

## 2022-01-24 MED ORDER — FOLIC ACID 1 MG PO TABS
1.0000 mg | ORAL_TABLET | Freq: Every day | ORAL | Status: DC
  Administered 2022-01-24 – 2022-01-25 (×2): 1 mg via ORAL
  Filled 2022-01-24 (×2): qty 1

## 2022-01-24 MED ORDER — SODIUM CHLORIDE 1 G PO TABS
1.0000 g | ORAL_TABLET | Freq: Two times a day (BID) | ORAL | Status: DC
  Administered 2022-01-24 – 2022-01-25 (×2): 1 g via ORAL
  Filled 2022-01-24 (×2): qty 1

## 2022-01-24 MED ORDER — LORAZEPAM 1 MG PO TABS
1.0000 mg | ORAL_TABLET | ORAL | Status: DC | PRN
  Filled 2022-01-24: qty 4

## 2022-01-24 MED ORDER — MAGNESIUM OXIDE 400 MG PO TABS
400.0000 mg | ORAL_TABLET | Freq: Every day | ORAL | Status: DC
  Administered 2022-01-24: 400 mg via ORAL
  Filled 2022-01-24 (×2): qty 1

## 2022-01-24 MED ORDER — LORAZEPAM 2 MG/ML IJ SOLN: 0.0000 mg | Freq: Two times a day (BID) | INTRAMUSCULAR | Status: DC

## 2022-01-24 MED ORDER — LORAZEPAM 2 MG/ML IJ SOLN
0.0000 mg | Freq: Four times a day (QID) | INTRAMUSCULAR | Status: DC
  Filled 2022-01-24: qty 1

## 2022-01-24 MED ORDER — ADULT MULTIVITAMIN W/MINERALS CH
1.0000 | ORAL_TABLET | Freq: Every day | ORAL | Status: DC
  Administered 2022-01-24 – 2022-01-25 (×2): 1 via ORAL
  Filled 2022-01-24 (×2): qty 1

## 2022-01-24 MED ORDER — ADULT MULTIVITAMIN W/MINERALS CH: 1.0000 | ORAL_TABLET | Freq: Every day | ORAL | Status: DC

## 2022-01-24 MED ORDER — DM-GUAIFENESIN ER 30-600 MG PO TB12: 1.0000 | ORAL_TABLET | Freq: Two times a day (BID) | ORAL | Status: DC | PRN

## 2022-01-24 MED ORDER — HYDRALAZINE HCL 20 MG/ML IJ SOLN: 5.0000 mg | INTRAMUSCULAR | Status: DC | PRN

## 2022-01-24 MED ORDER — ENOXAPARIN SODIUM 40 MG/0.4ML IJ SOSY
40.0000 mg | PREFILLED_SYRINGE | INTRAMUSCULAR | Status: DC
  Administered 2022-01-24: 40 mg via SUBCUTANEOUS
  Filled 2022-01-24: qty 0.4

## 2022-01-24 MED ORDER — PANTOPRAZOLE SODIUM 40 MG PO TBEC
40.0000 mg | DELAYED_RELEASE_TABLET | Freq: Two times a day (BID) | ORAL | Status: DC
  Administered 2022-01-24 – 2022-01-25 (×2): 40 mg via ORAL
  Filled 2022-01-24 (×2): qty 1

## 2022-01-24 MED ORDER — NITROGLYCERIN 0.4 MG SL SUBL
0.4000 mg | SUBLINGUAL_TABLET | SUBLINGUAL | Status: DC | PRN
Start: 2022-01-24 — End: 2022-01-25

## 2022-01-24 MED ORDER — IPRATROPIUM-ALBUTEROL 0.5-2.5 (3) MG/3ML IN SOLN
3.0000 mL | Freq: Once | RESPIRATORY_TRACT | Status: AC
  Administered 2022-01-24: 3 mL via RESPIRATORY_TRACT
  Filled 2022-01-24: qty 3

## 2022-01-24 MED ORDER — AMLODIPINE BESYLATE 10 MG PO TABS
10.0000 mg | ORAL_TABLET | Freq: Every day | ORAL | Status: DC
  Administered 2022-01-25: 10 mg via ORAL
  Filled 2022-01-24: qty 1

## 2022-01-24 MED ORDER — THIAMINE HCL 100 MG PO TABS
100.0000 mg | ORAL_TABLET | Freq: Every day | ORAL | Status: DC
  Administered 2022-01-24 – 2022-01-25 (×2): 100 mg via ORAL
  Filled 2022-01-24 (×2): qty 1

## 2022-01-24 MED ORDER — MORPHINE SULFATE (PF) 2 MG/ML IV SOLN: 1.0000 mg | INTRAVENOUS | Status: DC | PRN

## 2022-01-24 MED ORDER — MOMETASONE FURO-FORMOTEROL FUM 200-5 MCG/ACT IN AERO
2.0000 | INHALATION_SPRAY | Freq: Two times a day (BID) | RESPIRATORY_TRACT | Status: DC
  Administered 2022-01-24 – 2022-01-25 (×2): 2 via RESPIRATORY_TRACT
  Filled 2022-01-24: qty 8.8

## 2022-01-24 MED ORDER — ASPIRIN EC 81 MG PO TBEC
81.0000 mg | DELAYED_RELEASE_TABLET | Freq: Every day | ORAL | Status: DC
  Administered 2022-01-25: 81 mg via ORAL
  Filled 2022-01-24: qty 1

## 2022-01-24 MED ORDER — THIAMINE HCL 100 MG/ML IJ SOLN: 100.0000 mg | Freq: Every day | INTRAMUSCULAR | Status: DC

## 2022-01-24 MED ORDER — ONDANSETRON HCL 4 MG/2ML IJ SOLN: 4.0000 mg | Freq: Three times a day (TID) | INTRAMUSCULAR | Status: DC | PRN

## 2022-01-24 MED ORDER — CLOPIDOGREL BISULFATE 75 MG PO TABS
75.0000 mg | ORAL_TABLET | Freq: Every day | ORAL | Status: DC
  Administered 2022-01-25: 75 mg via ORAL
  Filled 2022-01-24: qty 1

## 2022-01-24 MED ORDER — LORAZEPAM 2 MG/ML IJ SOLN: 1.0000 mg | INTRAMUSCULAR | Status: DC | PRN

## 2022-01-24 MED ORDER — METOPROLOL SUCCINATE ER 50 MG PO TB24
50.0000 mg | ORAL_TABLET | Freq: Every day | ORAL | Status: DC
  Administered 2022-01-25: 50 mg via ORAL
  Filled 2022-01-24: qty 1

## 2022-01-24 NOTE — H&P (Addendum)
?History and Physical  ? ? ?Austin Patterson HWK:[id number] DOB: 1956/03/04 DOA: 01/24/2022 ? ?Referring MD/NP/PA:  ? ?PCP: Rutherford Limerick, PA  ? ?Patient coming from:  The patient is coming from home.  At baseline, pt is independent for most of ADL.       ? ?Chief Complaint: chest pain ? ?HPI: Austin Patterson is a 66 y.o. male with medical history significant of hypertension, hyperlipidemia, chronic respiratory failure on 2 L oxygen OPD, asthma, pulmonary fibrosis, CAD, stent placement, alcohol abuse, chronic hyponatremia, GERD, who presents with chest pain. ? ?Pt states that his chest pain started yesterday, which is located in substernal area, moderate, pressure-like, nonradiating.  Patient has chronic shortness of breath and chronic cough which has not changed.  No fever or chills.  Patient also has some pain below right rib cage.  No nausea, vomiting, or abdominal pain.  He had 1 loose stool bowel movement yesterday.  Currently no active diarrhea.  No symptoms of UTI. ? ?Data Reviewed and ED Course: pt was found to have trop 5 --> 4, WBC 7.3, renal function okay, sodium 127, BNP 13.4, temperature normal, blood pressure 149/84, heart rate 81, RR 20, oxygen saturation 97% on room air.  Chest x-ray showed chronic lung disease without new infiltration.  Patient is placed on telemetry bed for position ? ?EKG: I have personally reviewed.  Sinus rhythm, QTc 441, low voltage, nonspecific T wave change ? ?Review of Systems:  ? ?General: no fevers, chills, no body weight gain, fatigue ?HEENT: no blurry vision, hearing changes or sore throat ?Respiratory: has dyspnea, coughing, no wheezing ?CV: has chest pain, no palpitations ?GI: no nausea, vomiting, abdominal pain, diarrhea, constipation ?GU: no dysuria, burning on urination, increased urinary frequency, hematuria  ?Ext: no leg edema ?Neuro: no unilateral weakness, numbness, or tingling, no vision change or hearing loss ?Skin: no rash, no skin tear. ?MSK: No muscle  spasm, no deformity, no limitation of range of movement in spin ?Heme: No easy bruising.  ?Travel history: No recent long distant travel. ? ? ?Allergy:  ?Allergies  ?Allergen Reactions  ? Amoxicillin Anaphylaxis  ? Tizanidine   ?  Feet and ankle swell   ? ? ?Past Medical History:  ?Diagnosis Date  ? COPD (chronic obstructive pulmonary disease) ([place])   ? GERD (gastroesophageal reflux disease)   ? Hyperlipidemia   ? Hypertension   ? Pulmonary fibrosis ([place]) 11/2015  ? ? ?Past Surgical History:  ?Procedure Laterality Date  ? COLONOSCOPY    ? CORONARY STENT PLACEMENT    ? ESOPHAGOGASTRODUODENOSCOPY (EGD) WITH PROPOFOL N/A 09/23/2016  ? Procedure: ESOPHAGOGASTRODUODENOSCOPY (EGD) WITH PROPOFOL;  Surgeon: [name], MD;  Location: [facility] ENDOSCOPY;  Service: Endoscopy;  Laterality: N/A;  ? KYPHOPLASTY N/A 03/14/2020  ? Procedure: T7 & T11 KYPHOPLASTY;  Surgeon: Hessie Knows, MD;  Location: [facility] ORS;  Service: Orthopedics;  Laterality: N/A;  ? SHOULDER ACROMIOPLASTY    ? ? ?Social History:  reports that he quit smoking about 6 years ago. His smoking use included cigarettes. He has a 100.00 pack-year smoking history. He has quit using smokeless tobacco. He reports current alcohol use of about 28.0 standard drinks per week. He reports that he does not use drugs. ? ?Family History:  ?Family History  ?Problem Relation Age of Onset  ? Heart disease Mother   ?  ? ?Prior to Admission medications   ?Medication Sig Start Date End Date Taking? Authorizing Provider  ?acetaminophen (TYLENOL) 500 MG tablet Take 1,000 mg by  mouth every 6 (six) hours as needed for moderate pain or headache.    [provider]  ?albuterol (VENTOLIN HFA) 108 (90 Base) MCG/ACT inhaler Inhale 2 puffs into the lungs every 6 (six) hours as needed for wheezing or shortness of breath. 01/29/20   Danford, Suann Larry, MD  ?amLODipine (NORVASC) 10 MG tablet Take 1 tablet (10 mg total) by mouth daily. 08/19/16   Tawni Millers, MD  ?aspirin EC 81 MG tablet  Take 1 tablet (81 mg total) by mouth daily. 08/19/16   Tawni Millers, MD  ?atorvastatin (LIPITOR) 40 MG tablet Take 1 tablet (40 mg total) by mouth at bedtime. ?Patient taking differently: Take 40 mg by mouth daily. 08/19/16   Tawni Millers, MD  ?clopidogrel (PLAVIX) 75 MG tablet Take 1 tablet (75 mg total) by mouth daily. 08/19/16   Tawni Millers, MD  ?Dupilumab (DUPIXENT) 300 MG/2ML SOPN Inject 2 mLs into the skin every 14 (fourteen) days. 02/26/21   Tyler Pita, MD  ?EPINEPHrine 0.3 mg/0.3 mL IJ SOAJ injection Inject 0.3 mg into the muscle as needed for anaphylaxis.     [provider]  ?Fluticasone-Salmeterol (ADVAIR DISKUS) 250-50 MCG/DOSE AEPB Inhale 1 puff into the lungs 2 (two) times daily. 10/31/19 10/30/20  Tyler Pita, MD  ?HYDROcodone-acetaminophen (NORCO/VICODIN) 5-325 MG tablet Take 1 tablet by mouth every 6 (six) hours as needed for moderate pain. 03/14/20   Hessie Knows, MD  ?ipratropium-albuterol (DUONEB) 0.5-2.5 (3) MG/3ML SOLN Take 3 mLs by nebulization 4 (four) times daily as needed (wheezing/shortness of breath). 01/29/20   Danford, Suann Larry, MD  ?losartan-hydrochlorothiazide (HYZAAR) 100-12.5 MG tablet Take 1 tablet by mouth daily. 11/05/19   [provider]  ?magnesium oxide (MAG-OX) 400 (241.3 Mg) MG tablet Take 1 tablet (400 mg total) by mouth daily. ?Patient taking differently: Take 400 mg by mouth daily in the afternoon. 06/18/17   Hillary Bow, MD  ?methylPREDNISolone (MEDROL DOSEPAK) 4 MG TBPK tablet Take as directed in the package. 10/30/21   Tyler Pita, MD  ?metoprolol succinate (TOPROL-XL) 50 MG 24 hr tablet Take 1 tablet (50 mg total) by mouth daily. 08/19/16   Tawni Millers, MD  ?Multiple Vitamin (MULTIVITAMIN WITH MINERALS) TABS tablet Take 1 tablet by mouth daily. ?Patient taking differently: Take 1 tablet by mouth daily in the afternoon. 09/24/16   Fritzi Mandes, MD  ?pantoprazole (PROTONIX) 40 MG tablet Take 1 tablet (40 mg total) by mouth 2  (two) times daily. 09/23/16   Fritzi Mandes, MD  ?potassium chloride 20 MEQ TBCR Take 20 mEq by mouth daily. ?Patient taking differently: Take 20 mEq by mouth daily in the afternoon. 06/18/17   Hillary Bow, MD  ?Spacer/Aero-Holding Chambers (AEROCHAMBER MV) inhaler Use as instructed 10/05/19   Tyler Pita, MD  ? ? ?Physical Exam: ?Vitals:  ? 01/24/22 1200 01/24/22 1300 01/24/22 1330 01/24/22 1433  ?BP: 140/85 (!) 154/77 (!) 149/84 139/86  ?Pulse: 80 79 80 82  ?Resp: '18 20 20 20  '$ ?Temp:      ?TempSrc:      ?SpO2: 98% 98% 97% 97%  ?Weight:      ?Height:      ? ?General: Not in acute distress ?HEENT: ?      Eyes: PERRL, EOMI, no scleral icterus. ?      ENT: No discharge from the ears and nose, no pharynx injection, no tonsillar enlargement.  ?      Neck: No JVD, no bruit, no mass  felt. ?Heme: No neck lymph node enlargement. ?Cardiac: S1/S2, RRR, No murmurs, No gallops or rubs. ?Respiratory: Severely decreased air movement bilaterally ?GI: Soft, nondistended, nontender, no rebound pain, no organomegaly, BS present. ?GU: No hematuria ?Ext: No pitting leg edema bilaterally. 1+DP/PT pulse bilaterally. ?Musculoskeletal: No joint deformities, No joint redness or warmth, no limitation of ROM in spin. ?Skin: No rashes.  ?Neuro: Alert, oriented X3, cranial nerves II-XII grossly intact, moves all extremities normally. ?Psych: Patient is not psychotic, no suicidal or hemocidal ideation. ? ?Labs on Admission: I have personally reviewed following labs and imaging studies ? ?CBC: ?Recent Labs  ?Lab 01/24/22 ?1039  ?WBC 7.3  ?NEUTROABS 5.5  ?HGB 12.3*  ?HCT 36.8*  ?MCV 91.8  ?PLT 326  ? ?Basic Metabolic Panel: ?Recent Labs  ?Lab 01/24/22 ?1039  ?NA 127*  ?K 4.0  ?CL 91*  ?CO2 26  ?GLUCOSE 121*  ?BUN 18  ?CREATININE 0.80  ?CALCIUM 9.1  ? ?GFR: ?Estimated Creatinine Clearance: 95.2 mL/min (by C-G formula based on SCr of 0.8 mg/dL). ?Liver Function Tests: ?Recent Labs  ?Lab 01/24/22 ?1039  ?AST 29  ?ALT 27  ?ALKPHOS 41  ?BILITOT  0.8  ?PROT 7.6  ?ALBUMIN 4.3  ? ?No results for input(s): LIPASE, AMYLASE in the last 168 hours. ?No results for input(s): AMMONIA in the last 168 hours. ?Coagulation Profile: ?No results for input(s): I

## 2022-01-24 NOTE — Progress Notes (Signed)
Score for CIWA done at 9pm is 1. Next CIWA due at 3am.  ?-No Lorazepam given ?

## 2022-01-24 NOTE — ED Triage Notes (Addendum)
Pt via [facility] from home. Pt c/o centralized chest tightness for the past 3 day, also endorses increased SOB with exertion. Pt has a hx of COPD and recurrent PNA, pt on 2L [place] chronically. Pt has a hx of cardiac stent placement. EMS gave pt 1 Nitro spray with no relief. Pt took 324 ASA prior to EMS arrival. Pt is A&OX4 and NAD.  ?

## 2022-01-24 NOTE — ED Provider Notes (Signed)
? ?[facility] ?Provider Note ? ? ? Event Date/Time  ? First MD Initiated Contact with Patient 01/24/22 1037   ?  (approximate) ? ? ?History  ? ?Chest Pain ? ? ?HPI ? ?Austin Patterson is a 66 y.o. male who reports he has had pneumonia 6 times recently.  He has had some mild chest pressure constantly ever since the last episode of pneumonia few months back.  He reports he was mowing the lawn yesterday using his riding mower and developed some increasing chest pressure.  Remained there all night long.  He called EMS in the morning.  He is having chest pressure now.  It is stable is not worse with exertion.  It is not worse with deep breathing.  It is not associated with nausea or sweating or vomiting.  He is not coughing up anything. ? ?  ? ? ?Physical Exam  ? ?Triage Vital Signs: ?ED Triage Vitals  ?Enc Vitals Group  ?   BP   ?   Pulse   ?   Resp   ?   Temp   ?   Temp src   ?   SpO2   ?   Weight   ?   Height   ?   Head Circumference   ?   Peak Flow   ?   Pain Score   ?   Pain Loc   ?   Pain Edu?   ?   Excl. in [place]?   ? ? ?Most recent vital signs: ?Vitals:  ? 01/24/22 1330 01/24/22 1433  ?BP: (!) 149/84 139/86  ?Pulse: 80 82  ?Resp: 20 20  ?Temp:    ?SpO2: 97% 97%  ? ? ? ?General: Awake, no distress.  ?CV:  Good peripheral perfusion.  Heart regular rate and rhythm no audible murmurs ?Resp:  Normal effort.  Lung exam is very tight.  No crackles however ?Abd:  Somewhat distended no tenderness ?Extremities no edema.  Patient reports some bilateral shoulder shoulder soreness. ? ? ?ED Results / Procedures / Treatments  ? ?Labs ?(all labs ordered are listed, but only abnormal results are displayed) ?Labs Reviewed  ?COMPREHENSIVE METABOLIC PANEL - Abnormal; Notable for the following components:  ?    Result Value  ? Sodium 127 (*)   ? Chloride 91 (*)   ? Glucose, Bld 121 (*)   ? All other components within normal limits  ?CBC WITH DIFFERENTIAL/PLATELET - Abnormal; Notable for the following components:   ? RBC 4.01 (*)   ? Hemoglobin 12.3 (*)   ? HCT 36.8 (*)   ? RDW 15.8 (*)   ? All other components within normal limits  ?BRAIN NATRIURETIC PEPTIDE  ?HEMOGLOBIN A1C  ?HIV ANTIBODY (ROUTINE TESTING W REFLEX)  ?TROPONIN I (HIGH SENSITIVITY)  ?TROPONIN I (HIGH SENSITIVITY)  ?TROPONIN I (HIGH SENSITIVITY)  ? ? ? ?EKG ? ?EMS EKG read and interpreted by me shows normal sinus rhythm rate of 81 normal axis no acute ST-T wave changes ?EKG #1 read interpreted by me shows normal sinus rhythm rate of 83 normal axis compared to EMS EKG there is a flipped T in aVL otherwise there are no changes from previously.  EKG from December 2021 however does show flipped T waves in aVL and looks like this 1. ?EKG #2 read interpreted by me shows normal sinus rhythm rate of 77 normal axis no significant change from EKG #1. ? ? ?RADIOLOGY ? ? ? ?PROCEDURES: ? ?Critical Care performed:  ? ?  Procedures ? ? ?MEDICATIONS ORDERED IN ED: ?Medications  ?sodium chloride tablet 1 g (has no administration in time range)  ?ondansetron (ZOFRAN) injection 4 mg (has no administration in time range)  ?acetaminophen (TYLENOL) tablet 650 mg (has no administration in time range)  ?hydrALAZINE (APRESOLINE) injection 5 mg (has no administration in time range)  ?nitroGLYCERIN (NITROSTAT) SL tablet 0.4 mg (has no administration in time range)  ?morphine (PF) 2 MG/ML injection 1 mg (has no administration in time range)  ?albuterol (PROVENTIL) (2.5 MG/3ML) 0.083% nebulizer solution 3 mL (has no administration in time range)  ?dextromethorphan-guaiFENesin (MUCINEX DM) 30-600 MG per 12 hr tablet 1 tablet (has no administration in time range)  ?enoxaparin (LOVENOX) injection 40 mg (has no administration in time range)  ?ipratropium-albuterol (DUONEB) 0.5-2.5 (3) MG/3ML nebulizer solution 3 mL (3 mLs Nebulization Given 01/24/22 1129)  ? ? ? ?IMPRESSION / MDM / ASSESSMENT AND PLAN / ED COURSE  ?I reviewed the triage vital signs and the nursing  notes. ?----------------------------------------- ?2:54 PM on 01/24/2022 ?----------------------------------------- ?Patient is still having chest discomfort.  I discussed him with the hospitalist.  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 ? ?Patient may be having crescendo angina or chest pain relating to shortness of breath from his exposure to pollen yesterday.  I do not believe he has symptoms consistent with PE.  His pain is not waxing and waning is not pleuritic which is kind of dull and achy and chronic. ?Patient's troponin's are negative so MI or NSTEMI is very unlikely.  There are no signs or symptoms of pneumonia currently.  Chest x-ray is clear and his white count is okay. ?The patient is on the cardiac monitor to evaluate for evidence of arrhythmia and/or significant heart rate changes.  None were seen ? ? ? ?FINAL CLINICAL IMPRESSION(S) / ED DIAGNOSES  ? ?Final diagnoses:  ?Nonspecific chest pain  ? ? ? ?Rx / DC Orders  ? ?ED Discharge Orders   ? ? None  ? ?  ? ? ? ?Note:  This document was prepared using Dragon voice recognition software and may include unintentional dictation errors. ?  ?Nena Polio, MD ?01/24/22 1456 ? ?

## 2022-01-24 NOTE — Progress Notes (Signed)
Urine Sample collected and sent to Lab ?

## 2022-01-25 DIAGNOSIS — R079 Chest pain, unspecified: Secondary | ICD-10-CM | POA: Diagnosis not present

## 2022-01-25 DIAGNOSIS — I2583 Coronary atherosclerosis due to lipid rich plaque: Secondary | ICD-10-CM

## 2022-01-25 DIAGNOSIS — J4520 Mild intermittent asthma, uncomplicated: Secondary | ICD-10-CM | POA: Diagnosis not present

## 2022-01-25 DIAGNOSIS — I2510 Atherosclerotic heart disease of native coronary artery without angina pectoris: Secondary | ICD-10-CM

## 2022-01-25 DIAGNOSIS — F1010 Alcohol abuse, uncomplicated: Secondary | ICD-10-CM | POA: Diagnosis not present

## 2022-01-25 LAB — BASIC METABOLIC PANEL
Anion gap: 10 (ref 5–15)
BUN: 20 mg/dL (ref 8–23)
CO2: 26 mmol/L (ref 22–32)
Calcium: 9.5 mg/dL (ref 8.9–10.3)
Chloride: 95 mmol/L — ABNORMAL LOW (ref 98–111)
Creatinine, Ser: 0.79 mg/dL (ref 0.61–1.24)
GFR, Estimated: >60 mL/min (ref >60)
Glucose, Bld: 119 mg/dL — ABNORMAL HIGH (ref 70–99)
Potassium: 3.3 mmol/L — ABNORMAL LOW (ref 3.5–5.1)
Sodium: 131 mmol/L — ABNORMAL LOW (ref 135–145)

## 2022-01-25 LAB — LIPID PANEL
Cholesterol: 149 mg/dL (ref 0–200)
HDL: 59 mg/dL (ref >40)
LDL Cholesterol: 45 mg/dL (ref 0–99)
Total CHOL/HDL Ratio: 2.5 RATIO
Triglycerides: 223 mg/dL — ABNORMAL HIGH (ref <150)
VLDL: 45 mg/dL — ABNORMAL HIGH (ref 0–40)

## 2022-01-25 LAB — HIV ANTIBODY (ROUTINE TESTING W REFLEX): HIV Screen 4th Generation wRfx: NONREACTIVE

## 2022-01-25 LAB — HEMOGLOBIN A1C
Hgb A1c MFr Bld: 5.7 % — ABNORMAL HIGH (ref 4.8–5.6)
Mean Plasma Glucose: 116.89 mg/dL

## 2022-01-25 MED ORDER — POTASSIUM CHLORIDE CRYS ER 20 MEQ PO TBCR
40.0000 meq | EXTENDED_RELEASE_TABLET | Freq: Once | ORAL | Status: AC
  Administered 2022-01-25: 40 meq via ORAL
  Filled 2022-01-25: qty 2

## 2022-01-25 NOTE — Hospital Course (Addendum)
Mr. Ginley is a 66 y.o. M with COPD on 2 L oxygen at home, pulmonary fibrosis, hypertension, hyperlipidemia, GERD, CAD, stent placement, alcohol abuse, who presented with chest pain. ? ?Since January pneumonia has had vague CP.  On day before admisison, was mowing lawn on riding mower, had onset of central chest discomfort, persisted constantly overnight, came to ER in the morning. ? ?In the ER, serial troponins and serial ECGs normal, but still admitted for observation overnight. ?

## 2022-01-25 NOTE — Progress Notes (Signed)
TOC consult for substance abuse due to extensive alcohol intake per notes.  ? ?CSW has provided substance abuse information on AVS for patient's discharge today.  ? Pricilla Riffle, [place] ?[phone] ? ?

## 2022-01-25 NOTE — Discharge Summary (Signed)
?Physician Discharge Summary ?  ?Patient: Austin Patterson MRN: [id number] DOB: 06-23-1956  ?Admit date:     01/24/2022  ?Discharge date: 01/25/22  ?Discharge Physician: Edwin Dada  ? ?PCP: Rutherford Limerick, PA  ? ? ? ?Recommendations at discharge:  ?Follow up with PCP in 1 week ?[name]: Please obtain BMP to re-evaluate Na in 1 week ?Follow up with Pulmonology Dr. Patsey Berthold as able ? ? ? ? ? ? ? ?Discharge Diagnoses: ?Principal Problem: ?  Chest pain noncardiac, likely due to Bronchitis ?Active Problems: ?  CAD (coronary artery disease) ?  Hyponatremia ?  IPF (idiopathic pulmonary fibrosis) ([place]) ?  Essential hypertension ?  Hyperlipidemia ?  COPD (chronic obstructive pulmonary disease) ([place]) ?  Alcohol abuse ?  Asthma ?  Chronic respiratory failure with hypoxia ([facility]) ?  ? ? ?Hospital Course: ?Austin Patterson is a 66 y.o. M with COPD on 2 L oxygen at home, pulmonary fibrosis, hypertension, hyperlipidemia, GERD, CAD, stent placement, alcohol abuse, who presented with chest pain. ? ?Since January pneumonia has had vague CP.  On day before admisison, was mowing lawn on riding mower, had onset of central chest discomfort, persisted constantly overnight, came to ER in the morning. ? ?In the ER, serial troponins and serial ECGs normal, but still admitted for observation overnight. ? ? ? ?Chest pain resolved spontaneously overnight.  Serial ECG normal.  High sensitivity troponin within normal limits, ACS ruled out. ? ?Able to ambulate without symptoms.  Patient felt his symptoms were triggered by pollen exposure.  He reported not taking his Advair on a regular basis, and so this was recommended for at least the next week. ? ? ?  ? ? ? ? ? ?The [facility] Controlled Substances Registry was reviewed for this patient prior to discharge. ? ? ?  ? ? ? ?DISCHARGE MEDICATION: ?Allergies as of 01/25/2022   ? ?   Reactions  ? Amoxicillin Anaphylaxis  ? Tizanidine   ? Feet and ankle swell   ? ?  ? ?  ?Medication  List  ?  ? ?STOP taking these medications   ? ?methylPREDNISolone 4 MG Tbpk tablet ?Commonly known as: MEDROL DOSEPAK ?  ? ?  ? ?TAKE these medications   ? ?acetaminophen 500 MG tablet ?Commonly known as: TYLENOL ?Take 1,000 mg by mouth every 6 (six) hours as needed for moderate pain or headache. ?  ?AeroChamber MV inhaler ?Use as instructed ?  ?albuterol 108 (90 Base) MCG/ACT inhaler ?Commonly known as: VENTOLIN HFA ?Inhale 2 puffs into the lungs every 6 (six) hours as needed for wheezing or shortness of breath. ?  ?alendronate 70 MG tablet ?Commonly known as: FOSAMAX ?Take 70 mg by mouth once a week. Sunday ?  ?amLODipine 10 MG tablet ?Commonly known as: NORVASC ?Take 1 tablet (10 mg total) by mouth daily. ?  ?aspirin EC 81 MG tablet ?Take 1 tablet (81 mg total) by mouth daily. ?  ?atorvastatin 40 MG tablet ?Commonly known as: LIPITOR ?Take 1 tablet (40 mg total) by mouth at bedtime. ?What changed: when to take this ?  ?Calcium 600/Vitamin D 600-10 MG-MCG Tabs ?Generic drug: Calcium Carb-Cholecalciferol ?Take 1 tablet by mouth 2 (two) times daily. ?  ?clopidogrel 75 MG tablet ?Commonly known as: PLAVIX ?Take 1 tablet (75 mg total) by mouth daily. ?  ?Dupixent 300 MG/2ML Sopn ?Generic drug: Dupilumab ?Inject 2 mLs into the skin every 14 (fourteen) days. ?  ?EPINEPHrine 0.3 mg/0.3 mL Soaj injection ?Commonly known as:  EPI-PEN ?Inject 0.3 mg into the muscle as needed for anaphylaxis. ?  ?Fluticasone-Salmeterol 250-50 MCG/DOSE Aepb ?Commonly known as: Advair Diskus ?Inhale 1 puff into the lungs 2 (two) times daily. ?  ?ipratropium-albuterol 0.5-2.5 (3) MG/3ML Soln ?Commonly known as: DUONEB ?Take 3 mLs by nebulization 4 (four) times daily as needed (wheezing/shortness of breath). ?  ?Klor-Con M20 20 MEQ tablet ?Generic drug: potassium chloride SA ?Take 20 mEq by mouth daily. ?  ?losartan-hydrochlorothiazide 100-25 MG tablet ?Commonly known as: HYZAAR ?Take 1 tablet by mouth daily. ?  ?magnesium oxide 400 (241.3 Mg) MG  tablet ?Commonly known as: MAG-OX ?Take 1 tablet (400 mg total) by mouth daily. ?What changed: when to take this ?  ?metoprolol succinate 50 MG 24 hr tablet ?Commonly known as: TOPROL-XL ?Take 1 tablet (50 mg total) by mouth daily. ?  ?montelukast 10 MG tablet ?Commonly known as: SINGULAIR ?Take 10 mg by mouth daily. ?  ?multivitamin with minerals Tabs tablet ?Take 1 tablet by mouth daily. ?What changed: when to take this ?  ?pantoprazole 40 MG tablet ?Commonly known as: PROTONIX ?Take 1 tablet (40 mg total) by mouth 2 (two) times daily. ?  ? ?  ? ? Follow-up Information   ? ? Rutherford Limerick, PA. Schedule an appointment as soon as possible for a visit in 1 week(s).   ?Specialty: Physician Assistant ?Contact information: ?[address] ?[place] [address] ?[phone] ? ? ?  ?  ? ? Tyler Pita, MD. Go to.   ?Specialty: Pulmonary Disease ?Why: Your scheduled appt in May ?Contact information: ?[address] ?[place] [address] ?[phone] ? ? ?  ?  ? ?  ?  ? ?  ? ? ?Discharge Instructions   ? ? Discharge instructions   Complete by: As directed ?  ? From Dr. Loleta Books: ?You were observed overnight for chest pain. ?Here, your ECG and heart enzymes were normal, ruling out heart attack. ? ?I agree with you, this might have been from your lung disease (that combination of asthma, emphysema and fibrosis) tirggered by the pollen. ? ?With that in mind: ?Take your Advair on schedule for the next week ?Put it by your bed and take once in the morning when you wake up and once at night when you go to bed. ?After that, if you must, you can go back to taking as you were ? ?Also, resume taking your Singulair because this can help in allergy time ?And I recommend something like Allegra, Claritin or their generic equivalents. ?Take one of those two allergy medicines this time of year ? ? ?For the sodium:  ?I really believe this is a non-issue ?Eat and drink as you normally do ?Go see your primary care  physician in 1 week for a sodium check  ? Increase activity slowly   Complete by: As directed ?  ? ?  ? ? ?Discharge Exam: ?Filed Weights  ? 01/24/22 1044 01/24/22 1841  ?Weight: 86.2 kg 86 kg  ? ? ?General: Pt is alert, awake, not in acute distress, nasal cannula in place, on his home oxygen ?Cardiovascular: RRR, nl S1-S2, no murmurs appreciated.   No LE edema.   ?Respiratory: Normal respiratory rate and rhythm.  CTAB without rales or wheezes. ?Abdominal: Abdomen soft and non-tender.  No distension or HSM.   ?Neuro/Psych: Strength symmetric in upper and lower extremities.  Judgment and insight appear normal. ? ? ?Condition at discharge: good ? ?The results of significant diagnostics from  this hospitalization (including imaging, microbiology, ancillary and laboratory) are listed below for reference.  ? ?Imaging Studies: ?DG Chest Portable 1 View ? ?Result Date: 01/24/2022 ?CLINICAL DATA:  Centralized chest tightness over the last 3 days. Shortness of breath with exertion. EXAM: PORTABLE CHEST 1 VIEW COMPARISON:  10/30/2021.  02/04/2021. FINDINGS: Heart size is normal. Atherosclerotic change of the aorta is noted. Interstitial markings are more prominent than on the prior exams. This could represent mild progressive pulmonary scarring, but early interstitial edema is considered. No effusion however. Emphysematous changes with a dominant bulla in the right lower lung as seen previously. IMPRESSION: Chronic lung disease/emphysema with dominant bulla in the right lower lung. Slightly more prominent interstitial markings which may represent mild progressive scarring, though early interstitial edema is considered possible. Electronically Signed   By: Nelson Chimes M.D.   On: 01/24/2022 11:24   ? ?Microbiology: ?Results for orders placed or performed during the hospital encounter of 03/12/20  ?SARS CORONAVIRUS 2 (TAT 6-24 HRS) Nasopharyngeal Nasopharyngeal Swab     Status: None  ? Collection Time: 03/12/20 12:48 PM  ?  Specimen: Nasopharyngeal Swab  ?Result Value Ref Range Status  ? SARS Coronavirus 2 NEGATIVE NEGATIVE Final  ?  Comment: (NOTE) ?SARS-CoV-2 target nucleic acids are NOT DETECTED. ?The SARS-CoV-2 RNA is generally det

## 2022-02-06 NOTE — Progress Notes (Signed)
Radiology read the chest x-ray and said it was a possibility that he could have congestive failure.  He had hyponatremia which also can sometimes go along with congestive failure.  The patient was short of breath and that of course can go along with congestive failure.  I got the BNP to make sure he was not having congestive failure because that would have changes therapy a good deal.  It was very important to me and the hospitalist both that the BNP was negative.  It had a marked influence on his treatment. ?

## 2022-02-18 ENCOUNTER — Telehealth: Payer: Self-pay | Admitting: Pulmonary Disease

## 2022-02-18 MED ORDER — DUPIXENT 300 MG/2ML ~~LOC~~ SOAJ: 2.0000 mL | SUBCUTANEOUS | 3 refills | Status: DC

## 2022-02-18 NOTE — Telephone Encounter (Addendum)
Dupixent sent to preferred pharmacy.  ?Patient is aware and voiced his understanding.  ?Nothing further needed. ? ?

## 2022-02-23 ENCOUNTER — Inpatient Hospital Stay
Admission: EM | Admit: 2022-02-23 | Discharge: 2022-02-25 | DRG: 190 | Disposition: A | Discharge status: Home Health | Payer: Medicare HMO | Attending: Internal Medicine | Admitting: Internal Medicine

## 2022-02-23 ENCOUNTER — Other Ambulatory Visit: Payer: Self-pay

## 2022-02-23 ENCOUNTER — Emergency Department: Payer: Medicare HMO

## 2022-02-23 DIAGNOSIS — E878 Other disorders of electrolyte and fluid balance, not elsewhere classified: Secondary | ICD-10-CM | POA: Diagnosis present

## 2022-02-23 DIAGNOSIS — I10 Essential (primary) hypertension: Secondary | ICD-10-CM | POA: Diagnosis present

## 2022-02-23 DIAGNOSIS — Z7951 Long term (current) use of inhaled steroids: Secondary | ICD-10-CM

## 2022-02-23 DIAGNOSIS — Z880 Allergy status to penicillin: Secondary | ICD-10-CM

## 2022-02-23 DIAGNOSIS — Z20822 Contact with and (suspected) exposure to covid-19: Secondary | ICD-10-CM | POA: Diagnosis present

## 2022-02-23 DIAGNOSIS — Z87891 Personal history of nicotine dependence: Secondary | ICD-10-CM

## 2022-02-23 DIAGNOSIS — J8410 Pulmonary fibrosis, unspecified: Secondary | ICD-10-CM | POA: Diagnosis present

## 2022-02-23 DIAGNOSIS — Z955 Presence of coronary angioplasty implant and graft: Secondary | ICD-10-CM

## 2022-02-23 DIAGNOSIS — E876 Hypokalemia: Secondary | ICD-10-CM | POA: Diagnosis present

## 2022-02-23 DIAGNOSIS — Z9981 Dependence on supplemental oxygen: Secondary | ICD-10-CM | POA: Diagnosis not present

## 2022-02-23 DIAGNOSIS — Z79899 Other long term (current) drug therapy: Secondary | ICD-10-CM | POA: Diagnosis not present

## 2022-02-23 DIAGNOSIS — E871 Hypo-osmolality and hyponatremia: Secondary | ICD-10-CM | POA: Diagnosis present

## 2022-02-23 DIAGNOSIS — J439 Emphysema, unspecified: Secondary | ICD-10-CM | POA: Diagnosis present

## 2022-02-23 DIAGNOSIS — I2510 Atherosclerotic heart disease of native coronary artery without angina pectoris: Secondary | ICD-10-CM | POA: Diagnosis present

## 2022-02-23 DIAGNOSIS — Z7902 Long term (current) use of antithrombotics/antiplatelets: Secondary | ICD-10-CM | POA: Diagnosis not present

## 2022-02-23 DIAGNOSIS — J441 Chronic obstructive pulmonary disease with (acute) exacerbation: Secondary | ICD-10-CM | POA: Diagnosis present

## 2022-02-23 DIAGNOSIS — Z8249 Family history of ischemic heart disease and other diseases of the circulatory system: Secondary | ICD-10-CM

## 2022-02-23 DIAGNOSIS — J9621 Acute and chronic respiratory failure with hypoxia: Secondary | ICD-10-CM | POA: Diagnosis present

## 2022-02-23 DIAGNOSIS — E785 Hyperlipidemia, unspecified: Secondary | ICD-10-CM | POA: Diagnosis present

## 2022-02-23 DIAGNOSIS — Z888 Allergy status to other drugs, medicaments and biological substances status: Secondary | ICD-10-CM | POA: Diagnosis not present

## 2022-02-23 DIAGNOSIS — K219 Gastro-esophageal reflux disease without esophagitis: Secondary | ICD-10-CM | POA: Diagnosis present

## 2022-02-23 DIAGNOSIS — Z7982 Long term (current) use of aspirin: Secondary | ICD-10-CM

## 2022-02-23 LAB — BRAIN NATRIURETIC PEPTIDE: B Natriuretic Peptide: 14.1 pg/mL (ref 0.0–100.0)

## 2022-02-23 LAB — BASIC METABOLIC PANEL
Anion gap: 15 (ref 5–15)
Anion gap: 14 (ref 5–15)
BUN: 13 mg/dL (ref 8–23)
BUN: 12 mg/dL (ref 8–23)
CO2: 22 mmol/L (ref 22–32)
CO2: 23 mmol/L (ref 22–32)
Calcium: 9 mg/dL (ref 8.9–10.3)
Calcium: 9.2 mg/dL (ref 8.9–10.3)
Chloride: 88 mmol/L — ABNORMAL LOW (ref 98–111)
Chloride: 89 mmol/L — ABNORMAL LOW (ref 98–111)
Creatinine, Ser: 0.82 mg/dL (ref 0.61–1.24)
Creatinine, Ser: 0.78 mg/dL (ref 0.61–1.24)
GFR, Estimated: >60 mL/min (ref >60)
GFR, Estimated: >60 mL/min (ref >60)
Glucose, Bld: 100 mg/dL — ABNORMAL HIGH (ref 70–99)
Glucose, Bld: 157 mg/dL — ABNORMAL HIGH (ref 70–99)
Potassium: 3.1 mmol/L — ABNORMAL LOW (ref 3.5–5.1)
Potassium: 3.1 mmol/L — ABNORMAL LOW (ref 3.5–5.1)
Sodium: 125 mmol/L — ABNORMAL LOW (ref 135–145)
Sodium: 126 mmol/L — ABNORMAL LOW (ref 135–145)

## 2022-02-23 LAB — CBC WITH DIFFERENTIAL/PLATELET
Abs Immature Granulocytes: 0.05 10*3/uL (ref 0.00–0.07)
Basophils Absolute: 0 10*3/uL (ref 0.0–0.1)
Basophils Relative: 1 %
Eosinophils Absolute: 0.1 10*3/uL (ref 0.0–0.5)
Eosinophils Relative: 1 %
HCT: 34.6 % — ABNORMAL LOW (ref 39.0–52.0)
Hemoglobin: 11.8 g/dL — ABNORMAL LOW (ref 13.0–17.0)
Immature Granulocytes: 1 %
Lymphocytes Relative: 18 %
Lymphs Abs: 1.6 10*3/uL (ref 0.7–4.0)
MCH: 31.7 pg (ref 26.0–34.0)
MCHC: 34.1 g/dL (ref 30.0–36.0)
MCV: 93 fL (ref 80.0–100.0)
Monocytes Absolute: 0.8 10*3/uL (ref 0.1–1.0)
Monocytes Relative: 9 %
Neutro Abs: 6 10*3/uL (ref 1.7–7.7)
Neutrophils Relative %: 70 %
Platelets: 261 10*3/uL (ref 150–400)
RBC: 3.72 MIL/uL — ABNORMAL LOW (ref 4.22–5.81)
RDW: 16 % — ABNORMAL HIGH (ref 11.5–15.5)
WBC: 8.5 10*3/uL (ref 4.0–10.5)
nRBC: 0 % (ref 0.0–0.2)

## 2022-02-23 LAB — CBC
HCT: 35.4 % — ABNORMAL LOW (ref 39.0–52.0)
Hemoglobin: 12.2 g/dL — ABNORMAL LOW (ref 13.0–17.0)
MCH: 31.9 pg (ref 26.0–34.0)
MCHC: 34.5 g/dL (ref 30.0–36.0)
MCV: 92.7 fL (ref 80.0–100.0)
Platelets: 281 10*3/uL (ref 150–400)
RBC: 3.82 MIL/uL — ABNORMAL LOW (ref 4.22–5.81)
RDW: 16.2 % — ABNORMAL HIGH (ref 11.5–15.5)
WBC: 11.1 10*3/uL — ABNORMAL HIGH (ref 4.0–10.5)
nRBC: 0 % (ref 0.0–0.2)

## 2022-02-23 LAB — RESP PANEL BY RT-PCR (FLU A&B, COVID) ARPGX2
Influenza A by PCR: NEGATIVE
Influenza B by PCR: NEGATIVE
SARS Coronavirus 2 by RT PCR: NEGATIVE

## 2022-02-23 LAB — ETHANOL: Alcohol, Ethyl (B): 11 mg/dL — ABNORMAL HIGH (ref <10)

## 2022-02-23 LAB — D-DIMER, QUANTITATIVE: D-Dimer, Quant: 0.4 ug/mL-FEU (ref 0.00–0.50)

## 2022-02-23 LAB — TROPONIN I (HIGH SENSITIVITY)
Troponin I (High Sensitivity): 6 ng/L (ref <18)
Troponin I (High Sensitivity): 5 ng/L (ref <18)

## 2022-02-23 MED ORDER — IPRATROPIUM-ALBUTEROL 0.5-2.5 (3) MG/3ML IN SOLN
3.0000 mL | Freq: Once | RESPIRATORY_TRACT | Status: AC
  Administered 2022-02-23: 3 mL via RESPIRATORY_TRACT
  Filled 2022-02-23: qty 3

## 2022-02-23 MED ORDER — METHYLPREDNISOLONE SODIUM SUCC 40 MG IJ SOLR
40.0000 mg | Freq: Two times a day (BID) | INTRAMUSCULAR | Status: AC
  Administered 2022-02-23 (×2): 40 mg via INTRAVENOUS
  Filled 2022-02-23 (×2): qty 1

## 2022-02-23 MED ORDER — TRAZODONE HCL 50 MG PO TABS
25.0000 mg | ORAL_TABLET | Freq: Every evening | ORAL | Status: DC | PRN
  Administered 2022-02-23: 25 mg via ORAL
  Filled 2022-02-23: qty 1

## 2022-02-23 MED ORDER — SODIUM CHLORIDE 0.9 % IV SOLN
500.0000 mg | Freq: Every day | INTRAVENOUS | Status: DC
  Administered 2022-02-23 – 2022-02-25 (×3): 500 mg via INTRAVENOUS
  Filled 2022-02-23 (×3): qty 5

## 2022-02-23 MED ORDER — ENOXAPARIN SODIUM 60 MG/0.6ML IJ SOSY
0.5000 mg/kg | PREFILLED_SYRINGE | INTRAMUSCULAR | Status: DC
  Administered 2022-02-23 – 2022-02-24 (×2): 45 mg via SUBCUTANEOUS
  Filled 2022-02-23 (×2): qty 0.6

## 2022-02-23 MED ORDER — PREDNISONE 20 MG PO TABS
40.0000 mg | ORAL_TABLET | Freq: Every day | ORAL | Status: DC
  Administered 2022-02-24 – 2022-02-25 (×2): 40 mg via ORAL
  Filled 2022-02-23 (×2): qty 2

## 2022-02-23 MED ORDER — ADULT MULTIVITAMIN W/MINERALS CH
1.0000 | ORAL_TABLET | Freq: Every day | ORAL | Status: DC
  Administered 2022-02-23 – 2022-02-24 (×2): 1 via ORAL
  Filled 2022-02-23 (×2): qty 1

## 2022-02-23 MED ORDER — OYSTER SHELL CALCIUM/D3 500-5 MG-MCG PO TABS
1.0000 | ORAL_TABLET | Freq: Two times a day (BID) | ORAL | Status: DC
  Administered 2022-02-23 – 2022-02-25 (×5): 1 via ORAL
  Filled 2022-02-23 (×5): qty 1

## 2022-02-23 MED ORDER — ONDANSETRON HCL 4 MG/2ML IJ SOLN: 4.0000 mg | Freq: Four times a day (QID) | INTRAMUSCULAR | Status: DC | PRN

## 2022-02-23 MED ORDER — SODIUM CHLORIDE 0.9 % IV SOLN: 500.0000 mg | INTRAVENOUS | Status: DC

## 2022-02-23 MED ORDER — IPRATROPIUM-ALBUTEROL 0.5-2.5 (3) MG/3ML IN SOLN
3.0000 mL | Freq: Four times a day (QID) | RESPIRATORY_TRACT | Status: DC
  Administered 2022-02-23 – 2022-02-25 (×10): 3 mL via RESPIRATORY_TRACT
  Filled 2022-02-23 (×10): qty 3

## 2022-02-23 MED ORDER — AZITHROMYCIN 500 MG PO TABS: 500.0000 mg | ORAL_TABLET | Freq: Every day | ORAL | Status: DC

## 2022-02-23 MED ORDER — ACETAMINOPHEN 325 MG PO TABS
650.0000 mg | ORAL_TABLET | Freq: Four times a day (QID) | ORAL | Status: DC | PRN
  Administered 2022-02-23 – 2022-02-25 (×3): 650 mg via ORAL
  Filled 2022-02-23 (×3): qty 2

## 2022-02-23 MED ORDER — CLOPIDOGREL BISULFATE 75 MG PO TABS
75.0000 mg | ORAL_TABLET | Freq: Every day | ORAL | Status: DC
  Administered 2022-02-23 – 2022-02-25 (×3): 75 mg via ORAL
  Filled 2022-02-23 (×3): qty 1

## 2022-02-23 MED ORDER — GUAIFENESIN ER 600 MG PO TB12
600.0000 mg | ORAL_TABLET | Freq: Two times a day (BID) | ORAL | Status: DC
Start: 2022-02-23 — End: 2022-02-25
  Administered 2022-02-23 – 2022-02-25 (×5): 600 mg via ORAL
  Filled 2022-02-23 (×5): qty 1

## 2022-02-23 MED ORDER — HYDROCHLOROTHIAZIDE 25 MG PO TABS
25.0000 mg | ORAL_TABLET | Freq: Every day | ORAL | Status: DC
  Administered 2022-02-23 – 2022-02-25 (×3): 25 mg via ORAL
  Filled 2022-02-23 (×3): qty 1

## 2022-02-23 MED ORDER — LOSARTAN POTASSIUM 50 MG PO TABS
100.0000 mg | ORAL_TABLET | Freq: Every day | ORAL | Status: DC
  Administered 2022-02-23 – 2022-02-25 (×3): 100 mg via ORAL
  Filled 2022-02-23 (×3): qty 2

## 2022-02-23 MED ORDER — ATORVASTATIN CALCIUM 20 MG PO TABS
40.0000 mg | ORAL_TABLET | Freq: Every day | ORAL | Status: DC
Start: 2022-02-23 — End: 2022-02-25
  Administered 2022-02-23 – 2022-02-25 (×3): 40 mg via ORAL
  Filled 2022-02-23 (×3): qty 2

## 2022-02-23 MED ORDER — MONTELUKAST SODIUM 10 MG PO TABS
10.0000 mg | ORAL_TABLET | Freq: Every day | ORAL | Status: DC
  Administered 2022-02-23 – 2022-02-25 (×3): 10 mg via ORAL
  Filled 2022-02-23 (×3): qty 1

## 2022-02-23 MED ORDER — METOPROLOL SUCCINATE ER 50 MG PO TB24
50.0000 mg | ORAL_TABLET | Freq: Every day | ORAL | Status: DC
  Administered 2022-02-23 – 2022-02-25 (×3): 50 mg via ORAL
  Filled 2022-02-23 (×3): qty 1

## 2022-02-23 MED ORDER — AMLODIPINE BESYLATE 10 MG PO TABS
10.0000 mg | ORAL_TABLET | Freq: Every day | ORAL | Status: DC
  Administered 2022-02-23 – 2022-02-25 (×3): 10 mg via ORAL
  Filled 2022-02-23: qty 1
  Filled 2022-02-23: qty 2
  Filled 2022-02-23: qty 1

## 2022-02-23 MED ORDER — MAGNESIUM HYDROXIDE 400 MG/5ML PO SUSP: 30.0000 mL | Freq: Every day | ORAL | Status: DC | PRN

## 2022-02-23 MED ORDER — POTASSIUM CHLORIDE CRYS ER 20 MEQ PO TBCR
20.0000 meq | EXTENDED_RELEASE_TABLET | Freq: Every day | ORAL | Status: DC
  Administered 2022-02-23: 20 meq via ORAL
  Filled 2022-02-23: qty 1

## 2022-02-23 MED ORDER — HYDROCOD POLI-CHLORPHE POLI ER 10-8 MG/5ML PO SUER: 5.0000 mL | Freq: Two times a day (BID) | ORAL | Status: DC | PRN

## 2022-02-23 MED ORDER — METHYLPREDNISOLONE SODIUM SUCC 125 MG IJ SOLR
125.0000 mg | Freq: Once | INTRAMUSCULAR | Status: AC
  Administered 2022-02-23: 125 mg via INTRAVENOUS
  Filled 2022-02-23: qty 2

## 2022-02-23 MED ORDER — BUDESONIDE 0.25 MG/2ML IN SUSP
0.2500 mg | Freq: Two times a day (BID) | RESPIRATORY_TRACT | Status: DC
  Administered 2022-02-23 – 2022-02-25 (×5): 0.25 mg via RESPIRATORY_TRACT
  Filled 2022-02-23 (×5): qty 2

## 2022-02-23 MED ORDER — ASPIRIN EC 81 MG PO TBEC
81.0000 mg | DELAYED_RELEASE_TABLET | Freq: Every day | ORAL | Status: DC
Start: 2022-02-23 — End: 2022-02-25
  Administered 2022-02-23 – 2022-02-25 (×3): 81 mg via ORAL
  Filled 2022-02-23 (×3): qty 1

## 2022-02-23 MED ORDER — SODIUM CHLORIDE 0.9 % IV SOLN: INTRAVENOUS | Status: DC

## 2022-02-23 MED ORDER — ONDANSETRON HCL 4 MG PO TABS: 4.0000 mg | ORAL_TABLET | Freq: Four times a day (QID) | ORAL | Status: DC | PRN

## 2022-02-23 MED ORDER — PANTOPRAZOLE SODIUM 40 MG PO TBEC
40.0000 mg | DELAYED_RELEASE_TABLET | Freq: Two times a day (BID) | ORAL | Status: DC
  Administered 2022-02-23 – 2022-02-25 (×5): 40 mg via ORAL
  Filled 2022-02-23 (×5): qty 1

## 2022-02-23 MED ORDER — LOSARTAN POTASSIUM-HCTZ 100-25 MG PO TABS: 1.0000 | ORAL_TABLET | Freq: Every day | ORAL | Status: DC

## 2022-02-23 MED ORDER — ACETAMINOPHEN 650 MG RE SUPP
650.0000 mg | Freq: Four times a day (QID) | RECTAL | Status: DC | PRN
Start: 2022-02-23 — End: 2022-02-25

## 2022-02-23 MED ORDER — ALENDRONATE SODIUM 70 MG PO TABS: 70.0000 mg | ORAL_TABLET | ORAL | Status: DC

## 2022-02-23 MED ORDER — ARFORMOTEROL TARTRATE 15 MCG/2ML IN NEBU
15.0000 ug | INHALATION_SOLUTION | Freq: Two times a day (BID) | RESPIRATORY_TRACT | Status: DC
  Administered 2022-02-23 – 2022-02-25 (×5): 15 ug via RESPIRATORY_TRACT
  Filled 2022-02-23 (×6): qty 2

## 2022-02-23 MED ORDER — MAGNESIUM OXIDE -MG SUPPLEMENT 400 (240 MG) MG PO TABS
400.0000 mg | ORAL_TABLET | Freq: Every day | ORAL | Status: DC
  Administered 2022-02-23 – 2022-02-24 (×2): 400 mg via ORAL
  Filled 2022-02-23 (×2): qty 1

## 2022-02-23 NOTE — ED Notes (Signed)
Pt suddenly became SOB and difficulty breathing more than he arrived to [facility] room 8. Nursing staff and Dr. Alfred Levins at bedside. RT called at this time with Bipap machine ?

## 2022-02-23 NOTE — Assessment & Plan Note (Signed)
-   We will continue his antihypertensives. 

## 2022-02-23 NOTE — ED Provider Notes (Signed)
? ?[facility] ?Provider Note ? ? ? Event Date/Time  ? First MD Initiated Contact with Patient 02/23/22 0231   ?  (approximate) ? ? ?History  ? ?Chest Pain ? ? ?HPI ? ?Austin Patterson is a 66 y.o. male with a history of COPD, pulmonary fibrosis, chronic respiratory failure on 3 L nasal cannula, CAD, hypertension, hyperlipidemia who presents for evaluation of chest pain and shortness of breath.  Patient reports 2 days of constant left-sided dull chest pain radiating to the center of his chest associated with worsening shortness of breath and wheezing.  Shortness of breath became severe this evening.  Also has had 1 episode of vomiting and a diffuse throbbing headache for most of the day today.  He denies pleuritic chest pain, PE or DVT, recent travel immobilization, leg pain or swelling, hemoptysis or exogenous hormones.  He reports that the pain is similar to his prior COPD exacerbations.  He is also complaining of feeling like his abdomen is distended but denies abdominal pain. ?  ? ? ?Past Medical History:  ?Diagnosis Date  ? COPD (chronic obstructive pulmonary disease) ([place])   ? GERD (gastroesophageal reflux disease)   ? Hyperlipidemia   ? Hypertension   ? Pulmonary fibrosis ([place]) 11/2015  ? ? ?Past Surgical History:  ?Procedure Laterality Date  ? COLONOSCOPY    ? CORONARY STENT PLACEMENT    ? ESOPHAGOGASTRODUODENOSCOPY (EGD) WITH PROPOFOL N/A 09/23/2016  ? Procedure: ESOPHAGOGASTRODUODENOSCOPY (EGD) WITH PROPOFOL;  Surgeon: [name], MD;  Location: [facility] ENDOSCOPY;  Service: Endoscopy;  Laterality: N/A;  ? KYPHOPLASTY N/A 03/14/2020  ? Procedure: T7 & T11 KYPHOPLASTY;  Surgeon: Hessie Knows, MD;  Location: [facility] ORS;  Service: Orthopedics;  Laterality: N/A;  ? SHOULDER ACROMIOPLASTY    ? ? ? ?Physical Exam  ? ?Triage Vital Signs: ?ED Triage Vitals  ?Enc Vitals Group  ?   BP 02/23/22 0236 (!) 156/84  ?   Pulse Rate 02/23/22 0236 (!) 102  ?   Resp 02/23/22 0236 20  ?   Temp 02/23/22 0236  98.3 ?F (36.8 ?C)  ?   Temp Source 02/23/22 0236 Oral  ?   SpO2 02/23/22 0236 95 %  ?   Weight 02/23/22 0237 196 lb (88.9 kg)  ?   Height 02/23/22 0237 '5\' 7"'$  (1.702 m)  ?   Head Circumference --   ?   Peak Flow --   ?   Pain Score 02/23/22 0236 10  ?   Pain Loc --   ?   Pain Edu? --   ?   Excl. in [place]? --   ? ? ?Most recent vital signs: ?Vitals:  ? 02/23/22 0351 02/23/22 0621  ?BP:  (!) 149/82  ?Pulse: (!) 111 (!) 126  ?Resp: (!) 21 20  ?Temp:    ?SpO2: 99% 100%  ? ? ? ?Constitutional: Alert and oriented. Mild respiratory distress ?     Head: Normocephalic and atraumatic.    ?     Eyes: Conjunctivae are normal. Sclera is non-icteric.  ?     Mouth/Throat: Mucous membranes are moist.  ?     Neck: Supple with no signs of meningismus. ?Cardiovascular: Tachycardic with regular rhythm  ?respiratory: Mild respiratory distress, tachypneic with respiratory rate in the upper 20s, satting 100% on his 3 L nasal cannula with diffuse wheezing and decreased air movement bilaterally ?Gastrointestinal: Soft, non tender, and non distended with positive bowel sounds. No rebound or guarding. ?Genitourinary: No CVA tenderness. ?Musculoskeletal:  No edema, cyanosis, or erythema of extremities. ?Neurologic: Normal speech and language. Face is symmetric. Moving all extremities. No gross focal neurologic deficits are appreciated. ?Skin: Skin is warm, dry and intact. No rash noted. ?Psychiatric: Mood and affect are normal. Speech and behavior are normal. ? ?ED Results / Procedures / Treatments  ? ?Labs ?(all labs ordered are listed, but only abnormal results are displayed) ?Labs Reviewed  ?CBC WITH DIFFERENTIAL/PLATELET - Abnormal; Notable for the following components:  ?    Result Value  ? RBC 3.72 (*)   ? Hemoglobin 11.8 (*)   ? HCT 34.6 (*)   ? RDW 16.0 (*)   ? All other components within normal limits  ?BASIC METABOLIC PANEL - Abnormal; Notable for the following components:  ? Sodium 125 (*)   ? Potassium 3.1 (*)   ? Chloride 88 (*)   ?  Glucose, Bld 100 (*)   ? All other components within normal limits  ?ETHANOL - Abnormal; Notable for the following components:  ? Alcohol, Ethyl (B) 11 (*)   ? All other components within normal limits  ?BASIC METABOLIC PANEL - Abnormal; Notable for the following components:  ? Sodium 126 (*)   ? Potassium 3.1 (*)   ? Chloride 89 (*)   ? Glucose, Bld 157 (*)   ? All other components within normal limits  ?CBC - Abnormal; Notable for the following components:  ? WBC 11.1 (*)   ? RBC 3.82 (*)   ? Hemoglobin 12.2 (*)   ? HCT 35.4 (*)   ? RDW 16.2 (*)   ? All other components within normal limits  ?RESP PANEL BY RT-PCR (FLU A&B, COVID) [id number]  ?BRAIN NATRIURETIC PEPTIDE  ?D-DIMER, QUANTITATIVE  ?TROPONIN I (HIGH SENSITIVITY)  ?TROPONIN I (HIGH SENSITIVITY)  ? ? ? ?EKG ? ?ED ECG REPORT ?I, Rudene Re, the attending physician, personally viewed and interpreted this ECG. ? ?Sinus tachycardia with a rate of 102, normal intervals, borderline prolonged QTc, right axis deviation no ST elevations ? ? ?RADIOLOGY ?I, Rudene Re, attending MD, have personally viewed and interpreted the images obtained during this visit as below: ? ?Chest x-ray showing chronic changes of emphysema ? ? ?___________________________________________________ ?Interpretation by Radiologist:  ?DG Chest Portable 1 View ? ?Result Date: 02/23/2022 ?CLINICAL DATA:  Chest pain. EXAM: PORTABLE CHEST 1 VIEW COMPARISON:  Chest radiograph dated 01/24/2022. chest CT dated 02/09/2020. FINDINGS: Background of emphysema with large right lower lobe bullous changes. No focal consolidation, pleural effusion, or pneumothorax. The cardiac silhouette is within normal limits. No acute osseous pathology. Degenerative changes of the spine and shoulders. IMPRESSION: 1. No acute cardiopulmonary process. 2. Emphysema. Electronically Signed   By: Anner Crete M.D.   On: 02/23/2022 03:08   ? ? ? ? ?PROCEDURES: ? ?Critical Care performed: Yes, see critical care  procedure note(s) ? ?.Critical Care ?Performed by: Rudene Re, MD ?Authorized by: Rudene Re, MD  ? ?Critical care provider statement:  ?  Critical care time (minutes):  40 ?  Critical care time was exclusive of:  Separately billable procedures and treating other patients ?  Critical care was necessary to treat or prevent imminent or life-threatening deterioration of the following conditions:  Respiratory failure, circulatory failure, sepsis, shock, cardiac failure and CNS failure or compromise ?  Critical care was time spent personally by me on the following activities:  Development of treatment plan with patient or surrogate, discussions with consultants, evaluation of patient's response to treatment, examination of patient, ordering and  review of laboratory studies, ordering and review of radiographic studies, ordering and performing treatments and interventions, pulse oximetry, re-evaluation of patient's condition and review of old charts ?  I assumed direction of critical care for this patient from another provider in my specialty: no   ?  Care discussed with: admitting provider   ? ? ? ?IMPRESSION / MDM / ASSESSMENT AND PLAN / ED COURSE  ?I reviewed the triage vital signs and the nursing notes. ? ?66 y.o. male with a history of COPD, pulmonary fibrosis, chronic respiratory failure on 3 L nasal cannula, CAD, hypertension, hyperlipidemia who presents for evaluation of chest pain and shortness of breath x 2 days.  Patient arrives in mild respiratory distress, tachypneic with respiratory rate in the upper 20s but satting well on his home oxygen, he does have diffuse wheezing bilaterally, with decreased air movement, otherwise looks euvolemic, no asymmetric leg swelling, no fever ? ?Ddx: COPD exacerbation versus bronchitis versus CAD versus PE versus dissection versus pneumonia versus COVID versus flu versus myocarditis versus pericarditis versus pneumothorax versus pericardial effusion ? ? ?Plan:  EKG, troponin x2, chest x-ray, CBC, BMP, BNP, D-dimer, COVID and flu swab, chest x-ray.  Patient placed on telemetry for close monitoring of cardiorespiratory status.  We will give Solu-Medrol and DuoNebs ? ?

## 2022-02-23 NOTE — Progress Notes (Signed)
Condom catheter placed per pt request ?

## 2022-02-23 NOTE — ED Triage Notes (Signed)
Arrived via EMS reporting dull, constant left chest pain that radiates to central chest for the past few weeks. Reports one episode of vomiting and headache that began today. Reports he is chronically short of breath and wears 3L per [place]. AOX4. Resp even, unlabored on 3L.  ?

## 2022-02-23 NOTE — Assessment & Plan Note (Signed)
-   We will continue PPI therapy 

## 2022-02-23 NOTE — Progress Notes (Signed)
PHARMACIST - PHYSICIAN COMMUNICATION ? ?CONCERNING:  Enoxaparin (Lovenox) for DVT Prophylaxis  ? ? ?RECOMMENDATION: ?Patient was prescribed enoxaprin '40mg'$  q24 hours for VTE prophylaxis.  ? Danley Danker Weights  ? 02/23/22 0237  ?Weight: 88.9 kg (196 lb)  ? ? ?Body mass index is 30.7 kg/m?. ? ?Estimated Creatinine Clearance: 94.3 mL/min (by C-G formula based on SCr of 0.82 mg/dL). ? ? ?Based on [place] patient is candidate for enoxaparin 0.'5mg'$ /kg TBW SQ every 24 hours based on BMI being >30. ? ?DESCRIPTION: ?Pharmacy has adjusted enoxaparin dose per [facility] policy. ? ?Patient is now receiving enoxaparin 0.5 mg/kg every 24 hours  ? ?Renda Rolls, PharmD, MBA ?02/23/2022 ?4:47 AM ? ?

## 2022-02-23 NOTE — ED Notes (Signed)
RT at bedside placing pt on Bipap at this time  ?

## 2022-02-23 NOTE — Progress Notes (Signed)
PHARMACIST - PHYSICIAN COMMUNICATION ? ?CONCERNING: P&T Medication Policy Regarding Oral Bisphosphonates ? ?RECOMMENDATION: ?Your order for alendronate (Fosamax?), ibandronate (Boniva?), or risedronate (Actonel?) has been discontinued at this time. ? ?If the patient?s post-hospital medical condition warrants safe use of this class of drugs, please resume the pre-hospital regimen upon discharge. ? ?DESCRIPTION:  ?Alendronate (Fosamax?), ibandronate (Boniva?), and risedronate (Actonel?) can cause severe esophageal erosions in patients who are unable to remain upright at least 30 minutes after taking this medication.  ? ?Since brief interruptions in therapy are thought to have minimal impact on bone mineral density, the [facility] has established that bisphosphonate orders should be routinely discontinued during hospitalization.  ? ?To override this safety policy and permit administration of Boniva, Fosamax, or Actonel in the hospital, prescribers must write ?DO NOT HOLD? in the comments section when placing the order for this class of medications. ?  ?Renda Rolls, PharmD, MBA ?02/23/2022 ?4:51 AM ? ?

## 2022-02-23 NOTE — Progress Notes (Signed)
Brief hospitalist update note.  This is a nonbillable note.  Please see scanned H&P for full billable details. ? ?Briefly, this is a 66 year old male with history significant for COPD, GERD, hypertension, hyperlipidemia, IPF who presents to the ED with acute onset of dull midsternal chest pain associated with dyspnea, cough, wheezing.  Symptoms worsening over the past couple weeks.  Significantly worse on the day of admission.  Admits to cold chills.  Afebrile.  No chest pain on arrival. ? ?Initially required noninvasive positive pressure ventilation for increased work of breathing.  On my evaluation on 5/8 AM.  Patient is mentating clearly.  Work of breathing is not increased.  We will attempt to wean from BiPAP. ? ?Plan: ?Weaned from BiPAP as tolerated ?IV steroids ?Bronchodilators ?Oxygen therapy as necessary, wean to goal oxygen saturation 88-92% ? ?Offered to call family members but patient declined ? ?Ralene Muskrat MD ? ?No charge ?

## 2022-02-23 NOTE — ED Notes (Signed)
Pt trialed on 4L nasal cannula at this time. Pt 95% and in NAD. Pt reports work of breathing feels the same as when he was on bipap.  ?

## 2022-02-23 NOTE — H&P (Signed)
?  ?  ?[place] ? ? ?PATIENT NAME: Austin Patterson   ? ?MR#:  [id number] ? ?DATE OF BIRTH:  Nov 24, 1955 ? ?DATE OF ADMISSION:  02/23/2022 ? ?PRIMARY CARE PHYSICIAN: Rutherford Limerick, PA  ? ?Patient is coming from: Home ? ?REQUESTING/REFERRING PHYSICIAN: Rudene Re, MD ? ?CHIEF COMPLAINT:  ? ?Chief Complaint  ?Patient presents with  ? Chest Pain  ? ? ?HISTORY OF PRESENT ILLNESS:  ?Austin Patterson is a 66 y.o. male with medical history significant for COPD, GERD, hypertension, dyslipidemia and interstitial pulmonary fibrosis, who presented to the emergency room with acute onset of dull midsternal chest pain with associated dyspnea and cough and wheezing.  His respiratory symptoms have been worsening over the last couple of weeks and got significantly worse tonight.  He admits to cold chills.  No chest pain now.  No nausea or vomiting or abdominal pain.  No leg pain or edema or recent travels or surgeries.  No bleeding diathesis.  No dysuria, oliguria or hematuria or flank pain. ? ?ED Course: When the patient came to the ER, BP was 156/84 with heart rate of 102 with pulse oximetry of 95% on 3 L of O2 by nasal cannula.  Labs reveal hyponatremia and hypochloremia with a potassium of 3.1 and blood glucose of 100.  BNP was 14.1 in CBC showed mild anemia.  D-dimer was only 0.4.  Influenza antigens and COVID-19 PCR came back negative.  Alcohol level was 11.   ? ?EKG as reviewed by me : EKG showed sinus tachycardia with rate 102 probable RVH ?Imaging: Portable chest ray showed emphysema with no acute cardiopulmonary disease. ? ?The patient was given 3 DuoNebs and IV Solu-Medrol.  He was in significant respiratory distress and was placed on BiPAP.  He will be admitted to a progressive unit bed for further evaluation and management. ?PAST MEDICAL HISTORY:  ? ?Past Medical History:  ?Diagnosis Date  ? COPD (chronic obstructive pulmonary disease) ([place])   ? GERD (gastroesophageal reflux disease)   ? Hyperlipidemia   ?  Hypertension   ? Pulmonary fibrosis ([place]) 11/2015  ? ? ?PAST SURGICAL HISTORY:  ? ?Past Surgical History:  ?Procedure Laterality Date  ? COLONOSCOPY    ? CORONARY STENT PLACEMENT    ? ESOPHAGOGASTRODUODENOSCOPY (EGD) WITH PROPOFOL N/A 09/23/2016  ? Procedure: ESOPHAGOGASTRODUODENOSCOPY (EGD) WITH PROPOFOL;  Surgeon: [name], MD;  Location: [facility] ENDOSCOPY;  Service: Endoscopy;  Laterality: N/A;  ? KYPHOPLASTY N/A 03/14/2020  ? Procedure: T7 & T11 KYPHOPLASTY;  Surgeon: Hessie Knows, MD;  Location: [facility] ORS;  Service: Orthopedics;  Laterality: N/A;  ? SHOULDER ACROMIOPLASTY    ? ? ?SOCIAL HISTORY:  ? ?Social History  ? ?Tobacco Use  ? Smoking status: Former  ?  Packs/day: 2.00  ?  Years: 50.00  ?  Pack years: 100.00  ?  Types: Cigarettes  ?  Quit date: 10/14/2015  ?  Years since quitting: 6.3  ? Smokeless tobacco: Former  ?Substance Use Topics  ? Alcohol use: Yes  ?  Alcohol/week: 28.0 standard drinks  ?  Types: 28 Cans of beer per week  ?  Comment: occ  ? ? ?FAMILY HISTORY:  ? ?Family History  ?Problem Relation Age of Onset  ? Heart disease Mother   ? ? ?DRUG ALLERGIES:  ? ?Allergies  ?Allergen Reactions  ? Amoxicillin Anaphylaxis  ? Tizanidine   ?  Feet and ankle swell   ? ? ?REVIEW OF SYSTEMS:  ? ?ROS ?As per history of present  illness. All pertinent systems were reviewed above. Constitutional, HEENT, cardiovascular, respiratory, GI, GU, musculoskeletal, neuro, psychiatric, endocrine, integumentary and hematologic systems were reviewed and are otherwise negative/unremarkable except for positive findings mentioned above in the HPI. ? ? ?MEDICATIONS AT HOME:  ? ?Prior to Admission medications   ?Medication Sig Start Date End Date Taking? Authorizing Provider  ?acetaminophen (TYLENOL) 500 MG tablet Take 1,000 mg by mouth every 6 (six) hours as needed for moderate pain or headache.   Yes [provider]  ?albuterol (VENTOLIN HFA) 108 (90 Base) MCG/ACT inhaler Inhale 2 puffs into the lungs every 6 (six) hours  as needed for wheezing or shortness of breath. 01/29/20  Yes Danford, Suann Larry, MD  ?alendronate (FOSAMAX) 70 MG tablet Take 70 mg by mouth once a week. Sunday 01/05/22  Yes [provider]  ?amLODipine (NORVASC) 10 MG tablet Take 1 tablet (10 mg total) by mouth daily. 08/19/16  Yes Tawni Millers, MD  ?aspirin EC 81 MG tablet Take 1 tablet (81 mg total) by mouth daily. 08/19/16  Yes Tawni Millers, MD  ?atorvastatin (LIPITOR) 40 MG tablet Take 1 tablet (40 mg total) by mouth at bedtime. ?Patient taking differently: Take 40 mg by mouth daily. 08/19/16  Yes Tawni Millers, MD  ?CALCIUM 600/VITAMIN D 600-10 MG-MCG TABS Take 1 tablet by mouth 2 (two) times daily. 11/20/21  Yes [provider]  ?clopidogrel (PLAVIX) 75 MG tablet Take 1 tablet (75 mg total) by mouth daily. 08/19/16  Yes Tawni Millers, MD  ?Dupilumab (DUPIXENT) 300 MG/2ML SOPN Inject 300 mg into the skin every 14 (fourteen) days. 02/18/22  Yes Tyler Pita, MD  ?Fluticasone-Salmeterol (ADVAIR DISKUS) 250-50 MCG/DOSE AEPB Inhale 1 puff into the lungs 2 (two) times daily. 10/31/19 02/23/22 Yes Tyler Pita, MD  ?ipratropium-albuterol (DUONEB) 0.5-2.5 (3) MG/3ML SOLN Take 3 mLs by nebulization 4 (four) times daily as needed (wheezing/shortness of breath). 01/29/20  Yes Danford, Suann Larry, MD  ?KLOR-CON M20 20 MEQ tablet Take 20 mEq by mouth daily. 11/20/21  Yes [provider]  ?losartan-hydrochlorothiazide (HYZAAR) 100-25 MG tablet Take 1 tablet by mouth daily. 11/19/21  Yes [provider]  ?magnesium oxide (MAG-OX) 400 (241.3 Mg) MG tablet Take 1 tablet (400 mg total) by mouth daily. ?Patient taking differently: Take 400 mg by mouth daily in the afternoon. 06/18/17  Yes Hillary Bow, MD  ?metoprolol succinate (TOPROL-XL) 50 MG 24 hr tablet Take 1 tablet (50 mg total) by mouth daily. 08/19/16  Yes Tawni Millers, MD  ?montelukast (SINGULAIR) 10 MG tablet Take 10 mg by mouth daily. 11/19/21  Yes [provider]   ?Multiple Vitamin (MULTIVITAMIN WITH MINERALS) TABS tablet Take 1 tablet by mouth daily. ?Patient taking differently: Take 1 tablet by mouth daily in the afternoon. 09/24/16  Yes Fritzi Mandes, MD  ?pantoprazole (PROTONIX) 40 MG tablet Take 1 tablet (40 mg total) by mouth 2 (two) times daily. 09/23/16  Yes Fritzi Mandes, MD  ?EPINEPHrine 0.3 mg/0.3 mL IJ SOAJ injection Inject 0.3 mg into the muscle as needed for anaphylaxis.  ?Patient not taking: Reported on 02/23/2022    [provider]  ?Spacer/Aero-Holding Chambers (AEROCHAMBER MV) inhaler Use as instructed 10/05/19   Tyler Pita, MD  ? ?  ? ?VITAL SIGNS:  ?Blood pressure (!) 173/87, pulse (!) 111, temperature 98.3 ?F (36.8 ?C), temperature source Oral, resp. rate (!) 21, height '5\' 7"'$  (1.702 m), weight 88.9 kg, SpO2 99 %. ? ?PHYSICAL EXAMINATION:  ?Physical Exam ? ?  GENERAL: Acutely ill 66 y.o.-year-old patient lying in the bed with mild respiratory distress on BiPAP with conversational dyspnea. ?EYES: Pupils equal, round, reactive to light and accommodation. No scleral icterus. Extraocular muscles intact.  ?HEENT: Head atraumatic, normocephalic. Oropharynx and nasopharynx clear.  ?NECK:  Supple, no jugular venous distention. No thyroid enlargement, no tenderness.  ?LUNGS: Diffuse expiratory wheezes with tight expiratory airflow and harsh vesicular breathing. ?CARDIOVASCULAR: Regular rate and rhythm, S1, S2 normal. No murmurs, rubs, or gallops.  ?ABDOMEN: Soft, nondistended, nontender. Bowel sounds present. No organomegaly or mass.  ?EXTREMITIES: No pedal edema, cyanosis, or clubbing.  ?NEUROLOGIC: Cranial nerves II through XII are intact. Muscle strength 5/5 in all extremities. Sensation intact. Gait not checked.  ?PSYCHIATRIC: The patient is alert and oriented x 3.  Normal affect and good eye contact. ?SKIN: No obvious rash, lesion, or ulcer.  ? ?LABORATORY PANEL:  ? ?CBC ?Recent Labs  ?Lab 02/23/22 ?0238  ?WBC 8.5  ?HGB 11.8*  ?HCT 34.6*  ?PLT 261   ? ?------------------------------------------------------------------------------------------------------------------ ? ?Chemistries  ?Recent Labs  ?Lab 02/23/22 ?0238  ?NA 125*  ?K 3.1*  ?CL 88*  ?CO2 2

## 2022-02-23 NOTE — Assessment & Plan Note (Signed)
-   The patient will be admitted to a PCU bed. ?- We will continue BiPAP and taper off as tolerated. ?- O2 protocol will be followed. ?

## 2022-02-23 NOTE — Assessment & Plan Note (Signed)
-   The patient will be placed on IV steroid therapy with Solu-Medrol. ?- Bronchodilator therapy will be provided with DuoNebs 4 times daily and every 4 hours as needed. ?- We will hold off Advair Diskus. ?- We will continue Singulair. ?

## 2022-02-24 ENCOUNTER — Encounter: Payer: Self-pay | Admitting: Family Medicine

## 2022-02-24 DIAGNOSIS — J9621 Acute and chronic respiratory failure with hypoxia: Secondary | ICD-10-CM | POA: Diagnosis not present

## 2022-02-24 MED ORDER — GUAIFENESIN-DM 100-10 MG/5ML PO SYRP
5.0000 mL | ORAL_SOLUTION | ORAL | Status: DC | PRN
  Administered 2022-02-24: 5 mL via ORAL
  Filled 2022-02-24: qty 10

## 2022-02-24 MED ORDER — POTASSIUM CHLORIDE CRYS ER 20 MEQ PO TBCR
40.0000 meq | EXTENDED_RELEASE_TABLET | Freq: Every day | ORAL | Status: DC
  Administered 2022-02-24 – 2022-02-25 (×2): 40 meq via ORAL
  Filled 2022-02-24 (×2): qty 2

## 2022-02-24 NOTE — Progress Notes (Signed)
?PROGRESS NOTE ? ? ? ?Austin Patterson  XHB:[id number] DOB: 08-Sep-1956 DOA: 02/23/2022 ?PCP: Rutherford Limerick, PA  ? ? ?Brief Narrative:  ?66 year old male with history significant for COPD, GERD, hypertension, hyperlipidemia, IPF who presents to the ED with acute onset of dull midsternal chest pain associated with dyspnea, cough, wheezing.  Symptoms worsening over the past couple weeks.  Significantly worse on the day of admission.  Admits to cold chills.  Afebrile.  No chest pain on arrival. ?  ?Initially required noninvasive positive pressure ventilation for increased work of breathing.  On my evaluation on 5/8 AM.  Patient is mentating clearly.  Work of breathing is not increased.  We will attempt to wean from BiPAP. ? ?5/9: Successfully weaned from noninvasive positive pressure ventilation.  Still with poor air movement but oxygen saturation has improved. ? ? ? ? ?Assessment & Plan: ?  ?Principal Problem: ?  Acute on chronic respiratory failure with hypoxia ([place]) ?Active Problems: ?  COPD exacerbation ([place]) ?  Essential hypertension ?  GERD without esophagitis ? ?Acute on chronic hypoxic respiratory failure ?Acute exacerbation of COPD ?Patient initially required BiPAP on admission ?Now weaned off and saturating well on to 3 L nasal cannula ?Still with poor air movement and cough ?Stable shortness of breath ?Plan: ?Continue steroids ?Scheduled and as needed bronchodilators ?Daily Singulair, patient would like a prescription on discharge ?Oxygen, wean as tolerated ?Okay to transfer to [place] ?Anticipate discharge 5/10 ? ?GERD ?PPI ? ?Essential hypertension ?Well-controlled over interval ?Continue PTA Norvasc and Toprol ? ? ?DVT prophylaxis: SQ Lovenox ?Code Status: Full ?Family Communication: None.  Offered to call but patient declined ?Disposition Plan: Status is: Inpatient ?Remains inpatient appropriate because: Acute exacerbation of COPD.  Improving.  Anticipate medical readiness for discharge 5/10. ? ? ?Level  of care: Progressive ? ?Consultants:  ?None ? ?Procedures:  ?None ? ?Antimicrobials: ?None ? ? ?Subjective: ?Seen and examined.  Reports improvement in shortness of breath.  Not yet at baseline.  Poor air movement.  No pain complaints ? ?Objective: ?Vitals:  ? 02/24/22 0415 02/24/22 0733 02/24/22 0756 02/24/22 1255  ?BP: (!) 150/86 138/75  (!) 151/73  ?Pulse: 86 85  91  ?Resp: (!) '22 19  19  '$ ?Temp: 98.2 ?F (36.8 ?C) (!) 97.5 ?F (36.4 ?C)  97.6 ?F (36.4 ?C)  ?TempSrc:      ?SpO2: 96% 100% 95% 92%  ?Weight:      ?Height:      ? ? ?Intake/Output Summary (Last 24 hours) at 02/24/2022 1407 ?Last data filed at 02/24/2022 1048 ?Gross per 24 hour  ?Intake 480 ml  ?Output 2850 ml  ?Net -2370 ml  ? ?Filed Weights  ? 02/23/22 0237  ?Weight: 88.9 kg  ? ? ?Examination: ? ?General exam: Appears calm and comfortable  ?Respiratory system: Diminished breath sounds bilaterally.  Bibasilar crackles.  Normal work of breathing.  3 L ?Cardiovascular system: S1-S2, RRR, no murmurs, no pedal edema ?Gastrointestinal system: Soft, NT/ND, normal bowel sounds ?Central nervous system: Alert and oriented. No focal neurological deficits. ?Extremities: Symmetric 5 x 5 power. ?Skin: No rashes, lesions or ulcers ?Psychiatry: Judgement and insight appear normal. Mood & affect appropriate.  ? ? ? ?Data Reviewed: I have personally reviewed following labs and imaging studies ? ?CBC: ?Recent Labs  ?Lab 02/23/22 ?0238 02/23/22 ?8101  ?WBC 8.5 11.1*  ?NEUTROABS 6.0  --   ?HGB 11.8* 12.2*  ?HCT 34.6* 35.4*  ?MCV 93.0 92.7  ?PLT 261 281  ? ?Basic Metabolic Panel: ?  Recent Labs  ?Lab 02/23/22 ?0238 02/23/22 ?0034  ?NA 125* 126*  ?K 3.1* 3.1*  ?CL 88* 89*  ?CO2 22 23  ?GLUCOSE 100* 157*  ?BUN 13 12  ?CREATININE 0.82 0.78  ?CALCIUM 9.0 9.2  ? ?GFR: ?Estimated Creatinine Clearance: 96.6 mL/min (by C-G formula based on SCr of 0.78 mg/dL). ?Liver Function Tests: ?No results for input(s): AST, ALT, ALKPHOS, BILITOT, PROT, ALBUMIN in the last 168 hours. ?No results for  input(s): LIPASE, AMYLASE in the last 168 hours. ?No results for input(s): AMMONIA in the last 168 hours. ?Coagulation Profile: ?No results for input(s): INR, PROTIME in the last 168 hours. ?Cardiac Enzymes: ?No results for input(s): CKTOTAL, CKMB, CKMBINDEX, TROPONINI in the last 168 hours. ?BNP (last 3 results) ?No results for input(s): PROBNP in the last 8760 hours. ?HbA1C: ?No results for input(s): HGBA1C in the last 72 hours. ?CBG: ?No results for input(s): GLUCAP in the last 168 hours. ?Lipid Profile: ?No results for input(s): CHOL, HDL, LDLCALC, TRIG, CHOLHDL, LDLDIRECT in the last 72 hours. ?Thyroid Function Tests: ?No results for input(s): TSH, T4TOTAL, FREET4, T3FREE, THYROIDAB in the last 72 hours. ?Anemia Panel: ?No results for input(s): VITAMINB12, FOLATE, FERRITIN, TIBC, IRON, RETICCTPCT in the last 72 hours. ?Sepsis Labs: ?No results for input(s): PROCALCITON, LATICACIDVEN in the last 168 hours. ? ?Recent Results (from the past 240 hour(s))  ?Resp Panel by RT-PCR (Flu A&B, Covid) Nasopharyngeal Swab     Status: None  ? Collection Time: 02/23/22  3:17 AM  ? Specimen: Nasopharyngeal Swab; Nasopharyngeal(NP) swabs in vial transport medium  ?Result Value Ref Range Status  ? SARS Coronavirus 2 by RT PCR NEGATIVE NEGATIVE Final  ?  Comment: (NOTE) ?SARS-CoV-2 target nucleic acids are NOT DETECTED. ? ?The SARS-CoV-2 RNA is generally detectable in upper respiratory ?specimens during the acute phase of infection. The lowest ?concentration of SARS-CoV-2 viral copies this assay can detect is ?138 copies/mL. A negative result does not preclude SARS-Cov-2 ?infection and should not be used as the sole basis for treatment or ?other patient management decisions. A negative result may occur with  ?improper specimen collection/handling, submission of specimen other ?than nasopharyngeal swab, presence of viral mutation(s) within the ?areas targeted by this assay, and inadequate number of viral ?copies(<138 copies/mL). A  negative result must be combined with ?clinical observations, patient history, and epidemiological ?information. The expected result is Negative. ? ?Fact Sheet for Patients:  ?EntrepreneurPulse.com.au ? ?Fact Sheet for Healthcare Providers:  ?IncredibleEmployment.be ? ?This test is no t yet approved or cleared by the [place] FDA and  ?has been authorized for detection and/or diagnosis of SARS-CoV-2 by ?FDA under an Emergency Use Authorization (EUA). This EUA will remain  ?in effect (meaning this test can be used) for the duration of the ?COVID-19 declaration under Section 564(b)(1) of the Act, 21 ?U.S.C.section 360bbb-3(b)(1), unless the authorization is terminated  ?or revoked sooner.  ? ? ?  ? Influenza A by PCR NEGATIVE NEGATIVE Final  ? Influenza B by PCR NEGATIVE NEGATIVE Final  ?  Comment: (NOTE) ?The Xpert Xpress SARS-CoV-2/FLU/RSV plus assay is intended as an aid ?in the diagnosis of influenza from Nasopharyngeal swab specimens and ?should not be used as a sole basis for treatment. Nasal washings and ?aspirates are unacceptable for Xpert Xpress SARS-CoV-2/FLU/RSV ?testing. ? ?Fact Sheet for Patients: ?EntrepreneurPulse.com.au ? ?Fact Sheet for Healthcare Providers: ?IncredibleEmployment.be ? ?This test is not yet approved or cleared by the [place] FDA and ?has been authorized for detection and/or diagnosis of SARS-CoV-2  by ?FDA under an Emergency Use Authorization (EUA). This EUA will remain ?in effect (meaning this test can be used) for the duration of the ?COVID-19 declaration under Section 564(b)(1) of the Act, 21 U.S.C. ?section 360bbb-3(b)(1), unless the authorization is terminated or ?revoked. ? ?Performed at [facility], [place], ?[place] [address] ?  ?  ? ? ? ? ? ?Radiology Studies: ?DG Chest Portable 1 View ? ?Result Date: 02/23/2022 ?CLINICAL DATA:  Chest pain. EXAM: PORTABLE CHEST 1 VIEW  COMPARISON:  Chest radiograph dated 01/24/2022. chest CT dated 02/09/2020. FINDINGS: Background of emphysema with large right lower lobe bullous changes. No focal consolidation, pleural effusion, or pneum

## 2022-02-24 NOTE — Evaluation (Signed)
Occupational Therapy Evaluation ?Patient Details ?Name: Austin Patterson ?MRN: [id number] ?DOB: 1956-06-05 ?Today's Date: 02/24/2022 ? ? ?History of Present Illness 66 y.o. male with medical history significant for COPD, GERD, hypertension, dyslipidemia and interstitial pulmonary fibrosis, who presented to the emergency room with acute onset of dull midsternal chest pain with associated dyspnea and cough and wheezing.  His respiratory symptoms have been worsening over the last couple of weeks and got significantly worse tonight.  ? ?Clinical Impression ?  ?Pt seen for OT evaluation this date.  Pt presents with decline in ADLs and mobility d/t COPD exacerbation.  Pt presents with good sitting balance to perform LB ADL EOB, but reported feeling unsteady in standing, so walker was used for mobility within room with SBA.  Pt on 2L 02, this is also baseline for home.  02 desaturated to 84% following seated toileting and hand hygiene standing at sink.  Pt recovered with cues for PLB in sitting ~2 min to return to 90%.  02 rechecked and staying at 94% resting in chair.  Pt has all necessary DME for ADLs at home, including shower chair.  Educated on benefits of 3in1 as pt reports he has a low toilet at home, but pt declines need for this, stating that he supports self on sink or bathtub during toilet transfer.  Recommending [facility] OT at discharge as pt lives alone and was indep prior to admission.  Pt states he has neighbors who can assist as needed, and pt also has a cleaning lady 2x per month but states he can call her to help with any of his needs.  Will follow for OT in the acute setting to reinforce EC with ADLs, instruct in HEP, and reinforce fall prevention strategies.  Left pt sitting up in chair at end of session with all necessary items within reach.   ?   ? ?Recommendations for follow up therapy are one component of a multi-disciplinary discharge planning process, led by the attending physician.  Recommendations may be  updated based on patient status, additional functional criteria and insurance authorization.  ? ?Follow Up Recommendations ? Home health OT  ?  ?Assistance Recommended at Discharge Intermittent Supervision/Assistance  ?Patient can return home with the following A little help with bathing/dressing/bathroom;Assistance with cooking/housework;A little help with walking and/or transfers ? ?  ?Functional Status Assessment ? Patient has had a recent decline in their functional status and demonstrates the ability to make significant improvements in function in a reasonable and predictable amount of time.  ?Equipment Recommendations ? None recommended by OT  ?  ?   ? ? ?  ?Precautions / Restrictions Precautions ?Precautions: Fall ?Restrictions ?Weight Bearing Restrictions: No  ? ?  ? ?Mobility Bed Mobility ?Overal bed mobility: Modified Independent ?  ?  ?  ?  ?  ?  ?General bed mobility comments: HOB elevated and use of bed rail to perform supine<>sit ?Patient Response: Cooperative ? ?Transfers ?Overall transfer level: Needs assistance ?Equipment used: Rolling walker (2 wheels) ?Transfers: Sit to/from Stand ?Sit to Stand: Supervision ?  ?  ?  ?  ?  ?General transfer comment: pt typically does not use a walker but stated he felt unsteady and was very agreeable to using walker to eval.  Pt has a cane at home but states he doesn't have to use it at baseline. ?  ? ?  ?Balance Overall balance assessment: Needs assistance ?Sitting-balance support: No upper extremity supported, Feet supported ?Sitting balance-Leahy Scale: Normal ?Sitting balance -  Comments: Able to easily sit EOB to perform LB ADLs crossing legs without LOB ?  ?Standing balance support: Reliant on assistive device for balance, During functional activity ?Standing balance-Leahy Scale: Fair ?Standing balance comment: SBA for functional mobility in room with RW ?  ?  ?  ?  ?  ?  ?  ?  ?  ?  ?  ?   ? ?ADL either performed or assessed with clinical judgement  ? ?ADL  Overall ADL's : Needs assistance/impaired ?  ?  ?Grooming: Wash/dry hands;Supervision/safety;Standing ?Grooming Details (indicate cue type and reason): Pt reports feeling unsteady (used walker during session) and does require cues for PLB ?  ?  ?  ?  ?Upper Body Dressing : Set up;Sitting ?Upper Body Dressing Details (indicate cue type and reason): changed hospital gown ?Lower Body Dressing: Supervision/safety;Sitting/lateral leans ?Lower Body Dressing Details (indicate cue type and reason): able to doff/donn socks crossing legs sitting EOB with good stability, but increased dyspnea ?Toilet Transfer: Supervision/safety;Ambulation;Grab bars;Regular Toilet ?Toilet Transfer Details (indicate cue type and reason): supv d/t pt feeling unsteady and assist to manage IV pole and 02 tank ?Toileting- Clothing Manipulation and Hygiene: Supervision/safety;Sitting/lateral lean ?Toileting - Clothing Manipulation Details (indicate cue type and reason): able to have BM in commode ?  ?  ?Functional mobility during ADLs: Supervision/safety;Rolling walker (2 wheels) ?General ADL Comments: used RW to amb in room with close supv, cues for PLB.  02 sats dropped to 84% with OOB activity (amb to bathroom, toileting, hand hygiene); 1.5-2 min to recover to 90% in sitting with cues for PLB.  ? ? ? ?Vision Patient Visual Report: No change from baseline ?   ?   ?   ?  ?   ?  ? ?Pertinent Vitals/Pain Pain Assessment ?Pain Assessment: 0-10 ?Pain Score: 4  ?Pain Location: sternal area ?Pain Descriptors / Indicators: Aching ?Pain Intervention(s): Limited activity within patient's tolerance, Monitored during session  ? ? ? ?Hand Dominance Right ?  ?Extremity/Trunk Assessment Upper Extremity Assessment ?Upper Extremity Assessment: Overall WFL for tasks assessed (weakness in bilat shoulders (L>R), but ROM is [facility] for ADLs and biceps/triceps 5/5) ?  ?Lower Extremity Assessment ?Lower Extremity Assessment: Defer to PT evaluation ?  ?Cervical / Trunk  Assessment ?Cervical / Trunk Assessment: Normal ?  ?Communication Communication ?Communication: No difficulties ?  ?Cognition Arousal/Alertness: Awake/alert ?Behavior During Therapy: [facility] for tasks assessed/performed ?Overall Cognitive Status: Within Functional Limits for tasks assessed ?  ?  ?  ?  ?  ?  ?  ?  ?  ?  ?  ?  ?  ?  ?  ?  ?  ?  ?  ?    ? ?  ?Exercises Other Exercises ?Other Exercises: Educ provided on role of OT, goals, poc with good understanding ?  ?Shoulder Instructions    ? ? ?Home Living Family/patient expects to be discharged to:: Private residence ?Living Arrangements: Alone ?Available Help at Discharge: Neighbor;Other (Comment) (Pt has a housecleaner who comes 2 times monthly but pt reports he can call her anytime he needs anything.) ?Type of Home: House ?Home Access: Ramped entrance (3 steps in the back that pt manages with bilat hand rails) ?  ?  ?Home Layout: Laundry or work area in basement ?  ?  ?Bathroom Shower/Tub: Tub/shower unit ?  ?Bathroom Toilet: Standard (pt denies need for 3in1 over the toilet as he can hold to the sink or the bathtub) ?  ?  ?Home Equipment: Kasandra Knudsen - single  point;Shower seat ?  ?Additional Comments: Pt has a cane and a walking stick but he does not utilize them currently.  02 at home at 2L. ?  ? ?  ?Prior Functioning/Environment Prior Level of Function : Independent/Modified Independent ?  ?  ?  ?  ?  ?  ?Mobility Comments: indep without AD with constant 02 2L ?ADLs Comments: modified indep with basic ADLs.  Able to mow his law with riding mower.  Pt has paid help for cleaning 2x per month. ?  ? ?  ?  ?OT Problem List: Decreased activity tolerance;Cardiopulmonary status limiting activity;Impaired balance (sitting and/or standing);Pain ?  ?   ?OT Treatment/Interventions: Self-care/ADL training;Energy conservation;Balance training;Therapeutic exercise;DME and/or AE instruction;Therapeutic activities;Patient/family education  ?  ?OT Goals(Current goals can be found in the  care plan section) Acute Rehab OT Goals ?Patient Stated Goal: To go home ?OT Goal Formulation: With patient ?Time For Goal Achievement: 03/09/22 ?Potential to Achieve Goals: Good  ?OT Frequency: Min 2X/week

## 2022-02-24 NOTE — TOC Initial Note (Signed)
Transition of Care (TOC) - Initial/Assessment Note  ? ? ?Patient Details  ?Name: Austin Patterson ?MRN: [id number] ?Date of Birth: 01/02/1956 ? ?Transition of Care (TOC) CM/SW Contact:    ?Laurena Slimmer, RN ?Phone Number: ?02/24/2022, 9:27 AM ? ?Clinical Narrative:                 ? ?Transition of Care (TOC) Screening Note ? ? ?Patient Details  ?Name: Austin Patterson ?Date of Birth: [birth year] ? ? ?Transition of Care (TOC) CM/SW Contact:    ?Laurena Slimmer, RN ?Phone Number: ?02/24/2022, 9:27 AM ? ? ? ?Transition of Care Department [facility]) has reviewed patient and no TOC needs have been identified at this time. We will continue to monitor patient advancement through interdisciplinary progression rounds. If new patient transition needs arise, please place a TOC consult. ? ? ? ?  ?  ? ? ?Patient Goals and CMS Choice ?  ?  ?  ? ?Expected Discharge Plan and Services ?  ?  ?  ?  ?  ?                ?  ?  ?  ?  ?  ?  ?  ?  ?  ?  ? ?Prior Living Arrangements/Services ?  ?  ?  ?       ?  ?  ?  ?  ? ?Activities of Daily Living ?Home Assistive Devices/Equipment: None ?ADL Screening (condition at time of admission) ?Patient's cognitive ability adequate to safely complete daily activities?: Yes ?Is the patient deaf or have difficulty hearing?: No ?Does the patient have difficulty seeing, even when wearing glasses/contacts?: No ?Does the patient have difficulty concentrating, remembering, or making decisions?: No ?Patient able to express need for assistance with ADLs?: No ?Does the patient have difficulty dressing or bathing?: No ?Independently performs ADLs?: Yes (appropriate for developmental age) ?Does the patient have difficulty walking or climbing stairs?: No ?Weakness of Legs: None ?Weakness of Arms/Hands: None ? ?Permission Sought/Granted ?  ?  ?   ?   ?   ?   ? ?Emotional Assessment ?  ?  ?  ?  ?  ?  ? ?Admission diagnosis:  COPD exacerbation ([place]) [J44.1] ?COPD with acute exacerbation ([place]) [J44.1] ?Acute on chronic  respiratory failure with hypoxia ([facility]) [J96.21] ?Patient Active Problem List  ? Diagnosis Date Noted  ? COPD exacerbation ([place]) 02/23/2022  ? GERD without esophagitis 02/23/2022  ? Chest pain 01/24/2022  ? Alpha-1-antitrypsin deficiency carrier 01/10/2021  ? Asthma 03/12/2020  ? Chronic respiratory failure with hypoxia ([place]) 03/12/2020  ? Hyperlipidemia   ? COPD (chronic obstructive pulmonary disease) ([place])   ? Bleeding nose   ? Alcohol abuse   ? RUQ abdominal pain   ? Anaphylaxis 11/01/2019  ? Hypotension 11/01/2019  ? CAD (coronary artery disease) 11/01/2019  ? H/O heart artery stent 11/01/2019  ? Acute on chronic respiratory failure with hypoxia ([facility])   ? RUQ pain   ? Essential hypertension   ? IPF (idiopathic pulmonary fibrosis) ([place]) 08/30/2019  ? Hypomagnesemia 03/31/2017  ? Hiatal hernia   ? Reflux esophagitis   ? Hematemesis without nausea   ? Hyponatremia 09/21/2016  ? Multifocal pneumonia 12/09/2015  ? ?PCP:  Rutherford Limerick, PA ?Pharmacy:   ?[facility] - [place], [place] - [address], [address] ?[address], [address] ?[place] [address] ?Phone: [phone] Fax: [phone] ? ?[id number] [place], [place] ?[address] ?[place] [address] ?Phone: [phone] Fax: [phone] ? ?[place], [place] ?[address] ?[place] [address] ?Phone: [phone] Fax: [phone] ? ?[id number]?Phone: [phone]: [phone]? ?[place] ?[place]?Phone: [phone]: [phone]? ?[facility] [place] ?[place]?Phone: [phone]: [phone]? ? ? ? ?Social Determinants of Health (SDOH) Interventions ?   ? ?Readmission Risk Interventions ? ?  01/29/2020  ? 10:49 AM 09/02/2019  ?  9:40 AM  ?Readmission Risk Prevention Plan  ?Transportation Screening  Complete  ?PCP or Specialist Appt within 5-7 Days  Complete  ?PCP or Specialist Appt within 3-5 Days Complete   ?Home Care Screening  Complete  ?Medication Review (RN CM)  Complete  ?[place] [facility] Consult Complete   ?Social Work Consult for [place]Counseling Complete   ?Palliative Care Screening Not Applicable   ?Medication Review (Press photographer Complete   ? ? ? ?

## 2022-02-25 DIAGNOSIS — J9621 Acute and chronic respiratory failure with hypoxia: Secondary | ICD-10-CM | POA: Diagnosis not present

## 2022-02-25 LAB — BASIC METABOLIC PANEL
Anion gap: 6 (ref 5–15)
BUN: 24 mg/dL — ABNORMAL HIGH (ref 8–23)
CO2: 31 mmol/L (ref 22–32)
Calcium: 9.4 mg/dL (ref 8.9–10.3)
Chloride: 94 mmol/L — ABNORMAL LOW (ref 98–111)
Creatinine, Ser: 0.8 mg/dL (ref 0.61–1.24)
GFR, Estimated: >60 mL/min (ref >60)
Glucose, Bld: 118 mg/dL — ABNORMAL HIGH (ref 70–99)
Potassium: 4.1 mmol/L (ref 3.5–5.1)
Sodium: 131 mmol/L — ABNORMAL LOW (ref 135–145)

## 2022-02-25 MED ORDER — GUAIFENESIN ER 600 MG PO TB12: 600.0000 mg | ORAL_TABLET | Freq: Two times a day (BID) | ORAL | 0 refills | Status: AC

## 2022-02-25 MED ORDER — PREDNISONE 20 MG PO TABS: 40.0000 mg | ORAL_TABLET | Freq: Every day | ORAL | 0 refills | Status: AC

## 2022-02-25 MED ORDER — AZITHROMYCIN 500 MG PO TABS: 500.0000 mg | ORAL_TABLET | Freq: Every day | ORAL | 0 refills | Status: AC

## 2022-02-25 NOTE — Discharge Summary (Signed)
Austin Patterson ZOX:[id number] DOB: 1956/01/12 DOA: 02/23/2022 ? ?PCP: Rutherford Limerick, PA ? ?Admit date: 02/23/2022 ?Discharge date: 02/25/2022 ? ?Admitted From: home ?Disposition:  home ? ?Recommendations for Outpatient Follow-up:  ?Follow up with PCP in 1 week ?Please obtain BMP/CBC in one week ? ? ?  ? ? ?Discharge Condition:Stable ?CODE STATUS:full  ?Diet recommendation: Heart Healthy  ?Brief/Interim Summary: ?Per HPI:66 year old male with history significant for COPD, GERD, hypertension, hyperlipidemia, IPF who presents to the ED with acute onset of dull midsternal chest pain associated with dyspnea, cough, wheezing.  Symptoms worsening over the past couple weeks.  Significantly worse on the day of admission.  Admits to cold chills.  Afebrile.  No chest pain on arrival. ?  ?Initially required noninvasive positive pressure ventilation for increased work of breathing.  On my evaluation on 5/8 AM.  Patient is mentating clearly.  Work of breathing is not increased.  We will attempt to wean from BiPAP.Successfully weaned from noninvasive positive pressure ventilation. Now at baseline breathing and respirtaory status. Stable for discharge. ? ?Acute on chronic hypoxic respiratory failure ?Acute exacerbation of COPD ?Patient initially required BiPAP on admission ?weaned off  to nasal cannula ?Appears to be improving ?Will dc on steroids and abx ?Continue MDI ? ? ?GERD ?Continue PPI ? ? ?Essential hypertension ?Continue home meds.  ?  ? ? ?Discharge Diagnoses:  ?Principal Problem: ?  Acute on chronic respiratory failure with hypoxia (HCC) ?Active Problems: ?  COPD exacerbation ([place]) ?  Essential hypertension ?  GERD without esophagitis ? ? ? ?Discharge Instructions ? ?Discharge Instructions   ? ? Call MD for:  difficulty breathing, headache or visual disturbances   Complete by: As directed ?  ? Diet - low sodium heart healthy   Complete by: As directed ?  ? Discharge instructions   Complete by: As directed ?  ? F/u with  pcp in one week  ? Increase activity slowly   Complete by: As directed ?  ? ?  ? ?Allergies as of 02/25/2022   ? ?   Reactions  ? Amoxicillin Anaphylaxis  ? Tizanidine   ? Feet and ankle swell   ? ?  ? ?  ?Medication List  ?  ? ?TAKE these medications   ? ?acetaminophen 500 MG tablet ?Commonly known as: TYLENOL ?Take 1,000 mg by mouth every 6 (six) hours as needed for moderate pain or headache. ?  ?AeroChamber MV inhaler ?Use as instructed ?  ?albuterol 108 (90 Base) MCG/ACT inhaler ?Commonly known as: VENTOLIN HFA ?Inhale 2 puffs into the lungs every 6 (six) hours as needed for wheezing or shortness of breath. ?  ?alendronate 70 MG tablet ?Commonly known as: FOSAMAX ?Take 70 mg by mouth once a week. Sunday ?  ?amLODipine 10 MG tablet ?Commonly known as: NORVASC ?Take 1 tablet (10 mg total) by mouth daily. ?  ?aspirin EC 81 MG tablet ?Take 1 tablet (81 mg total) by mouth daily. ?  ?atorvastatin 40 MG tablet ?Commonly known as: LIPITOR ?Take 1 tablet (40 mg total) by mouth at bedtime. ?What changed: when to take this ?  ?azithromycin 500 MG tablet ?Commonly known as: Zithromax ?Take 1 tablet (500 mg total) by mouth daily for 3 days. Take 1 tablet daily for 3 days. ?Start taking on: Feb 26, 2022 ?  ?Calcium 600/Vitamin D 600-10 MG-MCG Tabs ?Generic drug: Calcium Carb-Cholecalciferol ?Take 1 tablet by mouth 2 (two) times daily. ?  ?clopidogrel 75 MG tablet ?Commonly known as: PLAVIX ?Take 1  tablet (75 mg total) by mouth daily. ?  ?Dupixent 300 MG/2ML Sopn ?Generic drug: Dupilumab ?Inject 300 mg into the skin every 14 (fourteen) days. ?  ?EPINEPHrine 0.3 mg/0.3 mL Soaj injection ?Commonly known as: EPI-PEN ?Inject 0.3 mg into the muscle as needed for anaphylaxis. ?  ?Fluticasone-Salmeterol 250-50 MCG/DOSE Aepb ?Commonly known as: Advair Diskus ?Inhale 1 puff into the lungs 2 (two) times daily. ?  ?guaiFENesin 600 MG 12 hr tablet ?Commonly known as: [place] ?Take 1 tablet (600 mg total) by mouth 2 (two) times daily for 7  days. ?  ?ipratropium-albuterol 0.5-2.5 (3) MG/3ML Soln ?Commonly known as: DUONEB ?Take 3 mLs by nebulization 4 (four) times daily as needed (wheezing/shortness of breath). ?  ?Klor-Con M20 20 MEQ tablet ?Generic drug: potassium chloride SA ?Take 20 mEq by mouth daily. ?  ?losartan-hydrochlorothiazide 100-25 MG tablet ?Commonly known as: HYZAAR ?Take 1 tablet by mouth daily. ?  ?magnesium oxide 400 (241.3 Mg) MG tablet ?Commonly known as: MAG-OX ?Take 1 tablet (400 mg total) by mouth daily. ?What changed: when to take this ?  ?metoprolol succinate 50 MG 24 hr tablet ?Commonly known as: TOPROL-XL ?Take 1 tablet (50 mg total) by mouth daily. ?  ?montelukast 10 MG tablet ?Commonly known as: SINGULAIR ?Take 10 mg by mouth daily. ?  ?multivitamin with minerals Tabs tablet ?Take 1 tablet by mouth daily. ?What changed: when to take this ?  ?pantoprazole 40 MG tablet ?Commonly known as: PROTONIX ?Take 1 tablet (40 mg total) by mouth 2 (two) times daily. ?  ?predniSONE 20 MG tablet ?Commonly known as: DELTASONE ?Take 2 tablets (40 mg total) by mouth daily with breakfast for 4 days. ?Start taking on: Feb 26, 2022 ?  ? ?  ? ?  ?  ? ? ?  ?Durable Medical Equipment  ?(From admission, onward)  ?  ? ? ?  ? ?  Start     Ordered  ? 02/25/22 1025  For home use only DME Walker rolling  Once       ?Question Answer Comment  ?Walker: With 5 Inch Wheels   ?Patient needs a walker to treat with the following condition Weakness   ?  ? 02/25/22 1025  ? ?  ?  ? ?  ? ? Follow-up Information   ? ? Whitten, Robin A, PA Follow up in 1 week(s).   ?Specialty: Physician Assistant ?Contact information: ?[address] ?[place] [address] ?[phone] ? ? ?  ?  ? ?  ?  ? ?  ? ?Allergies  ?Allergen Reactions  ? Amoxicillin Anaphylaxis  ? Tizanidine   ?  Feet and ankle swell   ? ? ?Consultations: ? ? ? ?Procedures/Studies: ?DG Chest Portable 1 View ? ?Result Date: 02/23/2022 ?CLINICAL DATA:  Chest pain. EXAM: PORTABLE CHEST 1 VIEW COMPARISON:   Chest radiograph dated 01/24/2022. chest CT dated 02/09/2020. FINDINGS: Background of emphysema with large right lower lobe bullous changes. No focal consolidation, pleural effusion, or pneumothorax. The cardiac silhouette is within normal limits. No acute osseous pathology. Degenerative changes of the spine and shoulders. IMPRESSION: 1. No acute cardiopulmonary process. 2. Emphysema. Electronically Signed   By: Anner Crete M.D.   On: 02/23/2022 03:08   ? ? ? ?Subjective: ?Feels better. No cough. No sob or cp ? ?Discharge Exam: ?Vitals:  ? 02/25/22 0352 02/25/22 0865  ?BP: (!) 156/83 (!) 149/77  ?Pulse: 91 85  ?Resp: 16 18  ?Temp: (!) 97.5 ?F (36.4 ?C) 98 ?F (36.7 ?C)  ?  SpO2: 97% 98%  ? ?Vitals:  ? 02/24/22 2047 02/24/22 2323 02/25/22 4497 02/25/22 5300  ?BP:  (!) 126/52 (!) 156/83 (!) 149/77  ?Pulse:  82 91 85  ?Resp:  '20 16 18  '$ ?Temp:  97.6 ?F (36.4 ?C) (!) 97.5 ?F (36.4 ?C) 98 ?F (36.7 ?C)  ?TempSrc:   Oral   ?SpO2: 94% 97% 97% 98%  ?Weight:      ?Height:      ? ? ?General: Pt is alert, awake, not in acute distress ?Cardiovascular: RRR, S1/S2 +, no rubs, no gallops ?Respiratory: CTA bilaterally, no wheezing, no rhonchi ?Abdominal: Soft, NT, ND, bowel sounds + ?Extremities: no edema, no cyanosis ? ? ? ?The results of significant diagnostics from this hospitalization (including imaging, microbiology, ancillary and laboratory) are listed below for reference.   ? ? ?Microbiology: ?Recent Results (from the past 240 hour(s))  ?Resp Panel by RT-PCR (Flu A&B, Covid) Nasopharyngeal Swab     Status: None  ? Collection Time: 02/23/22  3:17 AM  ? Specimen: Nasopharyngeal Swab; Nasopharyngeal(NP) swabs in vial transport medium  ?Result Value Ref Range Status  ? SARS Coronavirus 2 by RT PCR NEGATIVE NEGATIVE Final  ?  Comment: (NOTE) ?SARS-CoV-2 target nucleic acids are NOT DETECTED. ? ?The SARS-CoV-2 RNA is generally detectable in upper respiratory ?specimens during the acute phase of infection. The lowest ?concentration  of SARS-CoV-2 viral copies this assay can detect is ?138 copies/mL. A negative result does not preclude SARS-Cov-2 ?infection and should not be used as the sole basis for treatment or ?other patient m

## 2022-02-25 NOTE — Evaluation (Signed)
Physical Therapy Evaluation ?Patient Details ?Name: Austin Patterson ?MRN: [id number] ?DOB: [birth year] ?Today's Date: 02/25/2022 ? ?History of Present Illness ? 66 y.o. male with medical history significant for COPD, GERD, hypertension, dyslipidemia and interstitial pulmonary fibrosis, who presented to the emergency room 5/8 with acute onset of dull midsternal chest pain with associated dyspnea and cough and wheezing.  His respiratory symptoms have been worsening over the last couple of weeks and got significantly worse tonight.  ?Clinical Impression ? Pt was seen for progression of mobility, noting his SOB with effort but did manage to walk on RW with no actual physical assist.  Pt is appropriate for his therapy follow up to be HHPT but will recommend him to transition to pulm rehab vs outpatient PT to continue to increase endurance and LE strength.  Follow along with him to reduce his burden of effort to walk distances and return to PLOF with need for AD, as well as to monitor his O2 sats.     ?   ? ?Recommendations for follow up therapy are one component of a multi-disciplinary discharge planning process, led by the attending physician.  Recommendations may be updated based on patient status, additional functional criteria and insurance authorization. ? ?Follow Up Recommendations Home health PT ? ?  ?Assistance Recommended at Discharge Set up Supervision/Assistance  ?Patient can return home with the following ? A little help with bathing/dressing/bathroom;A little help with walking and/or transfers;Assistance with cooking/housework;Assist for transportation;Help with stairs or ramp for entrance ? ?  ?Equipment Recommendations Rolling walker (2 wheels)  ?Recommendations for Other Services ?    ?  ?Functional Status Assessment Patient has had a recent decline in their functional status and demonstrates the ability to make significant improvements in function in a reasonable and predictable amount of time.  ? ?   ?Precautions / Restrictions Precautions ?Precautions: Fall ?Precaution Comments: requires RW now ?Restrictions ?Weight Bearing Restrictions: No  ? ?  ? ?Mobility ? Bed Mobility ?  ?  ?  ?  ?  ?  ?  ?General bed mobility comments: up in chair when PT arrived ?  ? ?Transfers ?Overall transfer level: Needs assistance ?Equipment used: Rolling walker (2 wheels) ?Transfers: Sit to/from Stand ?Sit to Stand: Min guard ?  ?  ?  ?  ?  ?General transfer comment: agreeable to RW today ?  ? ?Ambulation/Gait ?Ambulation/Gait assistance: Min guard ?Gait Distance (Feet): 80 Feet ?Assistive device: Rolling walker (2 wheels), 1 person hand held assist ?Gait Pattern/deviations: Step-through pattern, Wide base of support ?Gait velocity: reduced ?Gait velocity interpretation: <1.31 ft/sec, indicative of household ambulator ?Pre-gait activities: standign balance ck ?General Gait Details: pt maneuvered on the hall with help for IV, O2 and catheter ? ?Stairs ?  ?  ?  ?  ?  ? ?Wheelchair Mobility ?  ? ?Modified Rankin (Stroke Patients Only) ?  ? ?  ? ?Balance Overall balance assessment: Needs assistance ?Sitting-balance support: Feet supported ?Sitting balance-Leahy Scale: Good ?  ?  ?Standing balance support: Bilateral upper extremity supported, During functional activity ?Standing balance-Leahy Scale: Fair ?  ?  ?  ?  ?  ?  ?  ?  ?  ?  ?  ?  ?   ? ? ? ?Pertinent Vitals/Pain Pain Assessment ?Pain Assessment: No/denies pain ?Pain Location: no more chest pain  ? ? ?Home Living Family/patient expects to be discharged to:: Private residence ?Living Arrangements: Alone ?Available Help at Discharge: Neighbor;Other (Comment) ?Type of Home: House ?Home Access:  Ramped entrance ?  ?  ?  ?Home Layout: Laundry or work area in basement ?Home Equipment: Cane - single point;BSC/3in1 ?Additional Comments: uses 3 in one as shower chair  ?  ?Prior Function Prior Level of Function : Independent/Modified Independent ?  ?  ?  ?  ?  ?  ?Mobility Comments:  indep without AD with constant 02 2L ?ADLs Comments: independent with O2 and has help with housework ?  ? ? ?Hand Dominance  ? Dominant Hand: Right ? ?  ?Extremity/Trunk Assessment  ? Upper Extremity Assessment ?Upper Extremity Assessment: Defer to OT evaluation ?  ? ?Lower Extremity Assessment ?Lower Extremity Assessment: Generalized weakness ?  ? ?Cervical / Trunk Assessment ?Cervical / Trunk Assessment: Other exceptions (mild forward head and shoulder, likely baseline)  ?Communication  ? Communication: No difficulties  ?Cognition Arousal/Alertness: Awake/alert ?Behavior During Therapy: [facility] for tasks assessed/performed ?Overall Cognitive Status: Within Functional Limits for tasks assessed ?  ?  ?  ?  ?  ?  ?  ?  ?  ?  ?  ?  ?  ?  ?  ?  ?  ?  ?  ? ?  ?General Comments General comments (skin integrity, edema, etc.): pt requires AD for his endurance and support of walking, low LE strength from chronic breathign issues and his disuse from this illness ? ?  ?Exercises    ? ?Assessment/Plan  ?  ?PT Assessment Patient needs continued PT services  ?PT Problem List Cardiopulmonary status limiting activity;Decreased activity tolerance;Decreased strength;Decreased balance;Decreased mobility;Decreased knowledge of use of DME ? ?   ?  ?PT Treatment Interventions DME instruction;Gait training;Functional mobility training;Therapeutic activities;Therapeutic exercise;Balance training;Neuromuscular re-education;Patient/family education   ? ?PT Goals (Current goals can be found in the Care Plan section)  ?Acute Rehab PT Goals ?Patient Stated Goal: to walk and go home today ?PT Goal Formulation: With patient ?Time For Goal Achievement: 03/04/22 ?Potential to Achieve Goals: Good ? ?  ?Frequency Min 2X/week ?  ? ? ?Co-evaluation   ?  ?  ?  ?  ? ? ?  ?AM-PAC PT "6 Clicks" Mobility  ?Outcome Measure Help needed turning from your back to your side while in a flat bed without using bedrails?: None ?Help needed moving from lying on your  back to sitting on the side of a flat bed without using bedrails?: A Little ?Help needed moving to and from a bed to a chair (including a wheelchair)?: A Little ?Help needed standing up from a chair using your arms (e.g., wheelchair or bedside chair)?: A Little ?Help needed to walk in hospital room?: A Little ?Help needed climbing 3-5 steps with a railing? : A Little ?6 Click Score: 19 ? ?  ?End of Session Equipment Utilized During Treatment: Oxygen ?Activity Tolerance: Patient tolerated treatment well;Patient limited by fatigue ?Patient left: in chair;with call bell/phone within reach ?Nurse Communication: Mobility status ?PT Visit Diagnosis: Unsteadiness on feet (R26.81);Muscle weakness (generalized) (M62.81) ?  ? ?Time: 5329-9242 ?PT Time Calculation (min) (ACUTE ONLY): 28 min ? ? ?Charges:   PT Evaluation ?$PT Eval Moderate Complexity: 1 Mod ?PT Treatments ?$Gait Training: 8-22 mins ?  ?   ? ?Ramond Dial ?02/25/2022, 12:05 PM ? ?Mee Hives, PT PhD ?Acute Rehab Dept. Number: [facility] [phone] and [place] [phone] ? ? ?

## 2022-02-25 NOTE — Progress Notes (Signed)
Pt given discharge teaching no concerns voiced. Pt IV and tele d/c. Pt escorted to his ride via wheelchair.  ?

## 2022-02-25 NOTE — TOC Transition Note (Signed)
Transition of Care (TOC) - CM/SW Discharge Note ? ? ?Patient Details  ?Name: Austin Patterson ?MRN: [id number] ?Date of Birth: 03-17-1956 ? ?Transition of Care (TOC) CM/SW Contact:  ?Laurena Slimmer, RN ?Phone Number: ?02/25/2022, 12:54 PM ? ? ?Clinical Narrative:    ?Spoke with patient by phone. Patient will return home. Stated he landlord will transport him home at discharge. Has home oxygen. Stated he already has his home oxygen in the room for transport home. Patient advised of PT recommendation for home PT. Patient has had [facility] in the past and is prefers their services again. [facility] representative contacted Referral accepted by [facility]. [facility] advised of patient discharge today. They will contact the patient. TOC signing off.  ? ? ?  ?  ? ? ?Patient Goals and CMS Choice ?  ?  ?  ? ?Discharge Placement ?  ?           ?  ?  ?  ?  ? ?Discharge Plan and Services ?  ?  ?           ?  ?  ?  ?  ?  ?  ?  ?  ?  ?  ? ?Social Determinants of Health (SDOH) Interventions ?  ? ? ?Readmission Risk Interventions ? ?  01/29/2020  ? 10:49 AM 09/02/2019  ?  9:40 AM  ?Readmission Risk Prevention Plan  ?Transportation Screening  Complete  ?PCP or Specialist Appt within 5-7 Days  Complete  ?PCP or Specialist Appt within 3-5 Days Complete   ?Home Care Screening  Complete  ?Medication Review (RN CM)  Complete  ?[place] or Home Care Consult Complete   ?Social Work Consult for [place] Planning/Counseling Complete   ?Palliative Care Screening Not Applicable   ?Medication Review [employer]) Complete   ? ? ? ? ? ?

## 2022-03-04 ENCOUNTER — Ambulatory Visit: Payer: Medicare HMO | Admitting: Pulmonary Disease

## 2022-03-04 ENCOUNTER — Encounter: Payer: Self-pay | Admitting: Pulmonary Disease

## 2022-03-04 VITALS — BP 126/80 | HR 86 | Temp 97.8°F | Ht 67.0 in | Wt 197.0 lb

## 2022-03-04 DIAGNOSIS — E8801 Alpha-1-antitrypsin deficiency: Secondary | ICD-10-CM

## 2022-03-04 DIAGNOSIS — J4550 Severe persistent asthma, uncomplicated: Secondary | ICD-10-CM

## 2022-03-04 DIAGNOSIS — J449 Chronic obstructive pulmonary disease, unspecified: Secondary | ICD-10-CM

## 2022-03-04 MED ORDER — METHYLPREDNISOLONE 4 MG PO TBPK: ORAL_TABLET | ORAL | 1 refills | Status: DC

## 2022-03-04 MED ORDER — FLUTICASONE-SALMETEROL 250-50 MCG/ACT IN AEPB: 1.0000 | INHALATION_SPRAY | Freq: Two times a day (BID) | RESPIRATORY_TRACT | 11 refills | Status: DC

## 2022-03-04 NOTE — Patient Instructions (Signed)
We have refilled your Advair 250/50. ? ?Sent in a prescription for a Medrol Dosepak to get to over this episode.  You will have a refill in case you have another exacerbation.  If you have to start taking the second pack please let us know. ? ?Use your Acapella several times a day at least 3-4 times a day.  This is helpful after the use of a nebulizer. ? ? ?We will see you in follow-up in 4 to 6 weeks time with either me or the nurse practitioner at that time. ?

## 2022-03-04 NOTE — Progress Notes (Signed)
Subjective:    Patient ID: Austin Patterson, male    DOB: 08-10-1956, 66 y.o.   MRN: [id number] Patient Care Team: Gildardo Pounds, PA as PCP - General (Physician Assistant)  Chief Complaint  Patient presents with   Follow-up    Recent admission. C/o sob with exertion and wheezing.    HPI Patient is a 66 year old with COPD (FEV1 1.23 L or 39% predicted, 03/26/2019) and reversible airways component, IgE at 1950 directed to Aspergillus fumigate [place] (ABPA) and underlying pulmonary fibrosis who presents for follow-up.  This is a scheduled visit.  He has chronic respiratory failure with hypoxia due to the issues noted above.  He has alpha-1 antitrypsin phenotype MZ but normal alpha-1 levels.  He is currently on Dupixent for management of his severe asthma component.  Last evaluated here on 30 October 2021 at that time doing very well.  Today he presents after recent admission to [facility] on 8 through 10 May for COPD exacerbation and acute on chronic respiratory failure.  He has had no fevers, chills or sweats since that admission.  No increase in cough or sputum production.  No hemoptysis.  He notes that he is coming back to "normal".  He notes no other issues.  He has not had any chest pain, no recent sick contacts.  Does not endorse any other symptomatology.  Wants to have a Medrol Dosepak available in case he starts having another issue with exacerbation.  He is compliant with his LABA/ICS and Dupixent.  Uses DuoNeb as well 4 times a day as needed.  Compliant with oxygen at 2 L/min continuously.  Still has some tenacious secretions.  Review of Systems A 10 point review of systems was performed and it is as noted above otherwise negative.  Patient Active Problem List   Diagnosis Date Noted   COPD exacerbation (HCC) 02/23/2022   GERD without esophagitis 02/23/2022   Chest pain 01/24/2022   Alpha-1-antitrypsin deficiency carrier 01/10/2021   Asthma 03/12/2020   Chronic respiratory failure with  hypoxia (HCC) 03/12/2020   Hyperlipidemia    COPD (chronic obstructive pulmonary disease) (HCC)    Bleeding nose    Alcohol abuse    RUQ abdominal pain    Anaphylaxis 11/01/2019   Hypotension 11/01/2019   CAD (coronary artery disease) 11/01/2019   H/O heart artery stent 11/01/2019   Acute on chronic respiratory failure with hypoxia (HCC)    RUQ pain    Essential hypertension    IPF (idiopathic pulmonary fibrosis) (HCC) 08/30/2019   Hypomagnesemia 03/31/2017   Hiatal hernia    Reflux esophagitis    Hematemesis without nausea    Hyponatremia 09/21/2016   Multifocal pneumonia 12/09/2015   Social History   Tobacco Use   Smoking status: Former    Packs/day: 2.00    Years: 50.00    Pack years: 100.00    Types: Cigarettes    Quit date: 10/14/2015    Years since quitting: 6.3   Smokeless tobacco: Former  Substance Use Topics   Alcohol use: Yes    Alcohol/week: 28.0 standard drinks    Types: 28 Cans of beer per week    Comment: occ   Allergies  Allergen Reactions   Amoxicillin Anaphylaxis   Tizanidine     Feet and ankle swell    Current Meds  Medication Sig   acetaminophen (TYLENOL) 500 MG tablet Take 1,000 mg by mouth every 6 (six) hours as needed for moderate pain or headache.   albuterol (VENTOLIN HFA)  108 (90 Base) MCG/ACT inhaler Inhale 2 puffs into the lungs every 6 (six) hours as needed for wheezing or shortness of breath.   alendronate (FOSAMAX) 70 MG tablet Take 70 mg by mouth once a week. Sunday   amLODipine (NORVASC) 10 MG tablet Take 1 tablet (10 mg total) by mouth daily.   aspirin EC 81 MG tablet Take 1 tablet (81 mg total) by mouth daily.   atorvastatin (LIPITOR) 40 MG tablet Take 1 tablet (40 mg total) by mouth at bedtime. (Patient taking differently: Take 40 mg by mouth daily.)   CALCIUM 600/VITAMIN D 600-10 MG-MCG TABS Take 1 tablet by mouth 2 (two) times daily.   clopidogrel (PLAVIX) 75 MG tablet Take 1 tablet (75 mg total) by mouth daily.   Dupilumab  (DUPIXENT) 300 MG/2ML SOPN Inject 300 mg into the skin every 14 (fourteen) days.   EPINEPHrine 0.3 mg/0.3 mL IJ SOAJ injection Inject 0.3 mg into the muscle as needed for anaphylaxis.   guaiFENesin (MUCINEX) 600 MG 12 hr tablet Take 1 tablet (600 mg total) by mouth 2 (two) times daily for 7 days.   ipratropium-albuterol (DUONEB) 0.5-2.5 (3) MG/3ML SOLN Take 3 mLs by nebulization 4 (four) times daily as needed (wheezing/shortness of breath).   KLOR-CON M20 20 MEQ tablet Take 20 mEq by mouth daily.   losartan-hydrochlorothiazide (HYZAAR) 100-25 MG tablet Take 1 tablet by mouth daily.   magnesium oxide (MAG-OX) 400 (241.3 Mg) MG tablet Take 1 tablet (400 mg total) by mouth daily. (Patient taking differently: Take 400 mg by mouth daily in the afternoon.)   methylPREDNISolone (MEDROL DOSEPAK) 4 MG TBPK tablet Take as directed in the package.   metoprolol succinate (TOPROL-XL) 50 MG 24 hr tablet Take 1 tablet (50 mg total) by mouth daily.   montelukast (SINGULAIR) 10 MG tablet Take 10 mg by mouth daily.   Multiple Vitamin (MULTIVITAMIN WITH MINERALS) TABS tablet Take 1 tablet by mouth daily. (Patient taking differently: Take 1 tablet by mouth daily in the afternoon.)   pantoprazole (PROTONIX) 40 MG tablet Take 1 tablet (40 mg total) by mouth 2 (two) times daily.   Spacer/Aero-Holding Chambers (AEROCHAMBER MV) inhaler Use as instructed   Immunization History  Administered Date(s) Administered   Influenza Inj Mdck Quad Pf 08/10/2019   Influenza Inj Mdck Quad With Preservative 07/18/2018   Influenza,inj,Quad PF,6+ Mos 10/15/2015, 08/31/2016   Influenza-Unspecified 08/10/2019, 07/19/2020   Moderna SARS-COV2 Booster Vaccination 10/23/2020   Moderna Sars-Covid-2 Vaccination 11/21/2019, 12/21/2019   Pneumococcal Polysaccharide-23 10/15/2015       Objective:   Physical Exam BP 126/80 (BP Location: Left Arm, Cuff Size: Normal)   Pulse 86   Temp 97.8 F (36.6 C) (Temporal)   Ht 5\' 7"  (1.702 m)   Wt  197 lb (89.4 kg)   SpO2 97%   BMI 30.85 kg/m   SpO2: 97 % O2 Device: Nasal cannula O2 Flow Rate (L/min): 2 L/min O2 Type: Continuous O2  GENERAL: Chronically ill appearing gentleman, mild use of accessories, presents in transport chair.  Wearing oxygen via nasal cannula.  No acute respiratory distress.  No plethora.  HEAD: Normocephalic, atraumatic. EYES: Pupils equal, round, reactive to light.  No scleral icterus. MOUTH: Oral mucosa moist, no thrush. NECK: Supple. No thyromegaly. Trachea midline. No JVD.  No adenopathy. PULMONARY: Increased AP diameter, significant kyphosis, coarse breath sounds, no wheezes.  Scattered rhonchi throughout.  Distant breath sounds. CARDIOVASCULAR: S1 and S2. Regular rate and rhythm.  Distant heart tones no murmur appreciated. GASTROINTESTINAL: Protuberant  abdomen otherwise benign. MUSCULOSKELETAL: No joint deformity, no clubbing, no edema. NEUROLOGIC: No overt focal deficit.  Speech is fluent.  Awake, alert. SKIN: Intact,warm,dry. PSYCH: In good spirits today.  Behavior appropriate     Assessment & Plan:     ICD-10-CM   1. Asthma-COPD overlap syndrome (HCC)  J44.9 Flutter valve   Recent exacerbation Appears compensated at present Continue Advair 250/50 1 inhalation twice a day Continue as needed albuterol    2. Severe persistent asthma without complication  J45.50    Continue Advair and as needed albuterol Continue Dupixent    3. 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    Dispense:  21 tablet    Refill:  1   fluticasone-salmeterol (ADVAIR DISKUS) 250-50 MCG/ACT AEPB    Sig: Inhale 1 puff into the lungs in the morning and at bedtime.    Dispense:  60 each    Refill:  11   Patient was reminded to use Acapella flutter valve 4 times a day to help  him clear secretions.  At his request refilled his Advair.  Will see him in follow-up in 4 to 6 weeks time he is to contact us prior to that time should any new difficulties arise.  Gailen Shelter, MD Advanced Bronchoscopy PCCM [facility]    *This note was dictated using voice recognition software/Dragon.  Despite best efforts to proofread, errors can occur which can change the meaning. Any transcriptional errors that result from this process are unintentional and may not be fully corrected at the time of dictation.

## 2022-03-18 ENCOUNTER — Telehealth: Payer: Self-pay | Admitting: Pulmonary Disease

## 2022-03-18 DIAGNOSIS — J449 Chronic obstructive pulmonary disease, unspecified: Secondary | ICD-10-CM

## 2022-03-18 NOTE — Telephone Encounter (Signed)
Spoke to patient. He stated that Austin Patterson is no longer in network with [employer]. Order placed to [employer] for 3L cont per request.  Pt is aware and voiced his understanding. Nothing further needed.

## 2022-05-19 ENCOUNTER — Telehealth: Payer: Self-pay | Admitting: Pulmonary Disease

## 2022-05-19 NOTE — Telephone Encounter (Signed)
Spoke to [place] with [employer].  He is aware that order was placed to [employer] 03/18/2022. He is requesting documentation to be faxed. He is unable to pull from Epic.  Dr. Patsey Berthold, please advise once 03/04/2022 OV note has been completed. Thanks

## 2022-05-21 ENCOUNTER — Encounter: Payer: Self-pay | Admitting: Pulmonary Disease

## 2022-05-21 ENCOUNTER — Ambulatory Visit (INDEPENDENT_AMBULATORY_CARE_PROVIDER_SITE_OTHER): Payer: Medicare HMO | Admitting: Pulmonary Disease

## 2022-05-21 VITALS — BP 128/78 | HR 81 | Temp 97.9°F | Ht 67.0 in | Wt 197.0 lb

## 2022-05-21 DIAGNOSIS — Z148 Genetic carrier of other disease: Secondary | ICD-10-CM | POA: Diagnosis not present

## 2022-05-21 DIAGNOSIS — J8410 Pulmonary fibrosis, unspecified: Secondary | ICD-10-CM | POA: Diagnosis not present

## 2022-05-21 DIAGNOSIS — Z87891 Personal history of nicotine dependence: Secondary | ICD-10-CM

## 2022-05-21 DIAGNOSIS — J449 Chronic obstructive pulmonary disease, unspecified: Secondary | ICD-10-CM

## 2022-05-21 DIAGNOSIS — J9611 Chronic respiratory failure with hypoxia: Secondary | ICD-10-CM | POA: Diagnosis not present

## 2022-05-21 NOTE — Patient Instructions (Signed)
Your lungs sounded good today.  Continue using your current medications.  Would be the Advair, DuoNeb and as needed albuterol.  Continue your Dupixent.  See you in follow-up in 3 months time call sooner should any new problems arise.

## 2022-05-25 ENCOUNTER — Encounter: Payer: Self-pay | Admitting: Pulmonary Disease

## 2022-05-25 NOTE — Progress Notes (Signed)
Subjective:    Patient ID: Austin Patterson, male    DOB: [birth year], 66 y.o.   MRN: [id number] Patient Care Team: Rutherford Limerick, PA as PCP - General (Physician Assistant)  Chief Complaint  Patient presents with   Follow-up    SOB with exertion.     HPI Patient is a 66 year old with COPD (FEV1 1.23 L or 39% predicted, 03/26/2019) and reversible airways component, IgE at 1950 directed to Aspergillus fumigatus (ABPA) and underlying pulmonary fibrosis, who presents for follow-up.  This is a scheduled visit.  He was last seen on 04 Mar 2022 and at that time require a Medrol Dosepak for mild exacerbation.  He has chronic respiratory failure with hypoxia due to the issues noted above.  He is compliant with oxygen at 2 L/min 24/7.  He has alpha-1 antitrypsin phenotype MZ but normal alpha-1 levels.  He is currently on Dupixent for management of his asthma component.  Today he presents with dyspnea at his baseline with no new complaint.  He has been compliant with his Dupixent and states that he is doing well.  He has had no fevers, chills or sweats.  No increase in cough or sputum production.  No hemoptysis. He notes no other issues.  He has not had any chest pain, no recent sick contacts.  Does not endorse any other symptomatology.  He has recovered fully from the mild exacerbation he was noted to have during his last visit.  Review of Systems A 10 point review of systems was performed and it is as noted above otherwise negative.  Patient Active Problem List   Diagnosis Date Noted   COPD exacerbation ([place]) 02/23/2022   GERD without esophagitis 02/23/2022   Chest pain 01/24/2022   Alpha-1-antitrypsin deficiency carrier 01/10/2021   Asthma 03/12/2020   Chronic respiratory failure with hypoxia ([place]) 03/12/2020   Hyperlipidemia    COPD (chronic obstructive pulmonary disease) (HCC)    Bleeding nose    Alcohol abuse    RUQ abdominal pain    Anaphylaxis 11/01/2019   Hypotension 11/01/2019    CAD (coronary artery disease) 11/01/2019   H/O heart artery stent 11/01/2019   Acute on chronic respiratory failure with hypoxia (HCC)    RUQ pain    Essential hypertension    IPF (idiopathic pulmonary fibrosis) ([place]) 08/30/2019   Hypomagnesemia 03/31/2017   Hiatal hernia    Reflux esophagitis    Hematemesis without nausea    Hyponatremia 09/21/2016   Multifocal pneumonia 12/09/2015   Social History   Tobacco Use   Smoking status: Former    Packs/day: 2.00    Years: 50.00    Total pack years: 100.00    Types: Cigarettes    Quit date: 10/14/2015    Years since quitting: 6.6   Smokeless tobacco: Former  Substance Use Topics   Alcohol use: Yes    Alcohol/week: 28.0 standard drinks of alcohol    Types: 28 Cans of beer per week    Comment: occ   Allergies  Allergen Reactions   Amoxicillin Anaphylaxis   Tizanidine     Feet and ankle swell    Current Meds  Medication Sig   acetaminophen (TYLENOL) 500 MG tablet Take 1,000 mg by mouth every 6 (six) hours as needed for moderate pain or headache.   albuterol (VENTOLIN HFA) 108 (90 Base) MCG/ACT inhaler Inhale 2 puffs into the lungs every 6 (six) hours as needed for wheezing or shortness of breath.   alendronate (FOSAMAX) 70 MG  tablet Take 70 mg by mouth once a week. Sunday   amLODipine (NORVASC) 10 MG tablet Take 1 tablet (10 mg total) by mouth daily.   aspirin EC 81 MG tablet Take 1 tablet (81 mg total) by mouth daily.   atorvastatin (LIPITOR) 40 MG tablet Take 1 tablet (40 mg total) by mouth at bedtime. (Patient taking differently: Take 40 mg by mouth daily.)   CALCIUM 600/VITAMIN D 600-10 MG-MCG TABS Take 1 tablet by mouth 2 (two) times daily.   clopidogrel (PLAVIX) 75 MG tablet Take 1 tablet (75 mg total) by mouth daily.   Dupilumab (DUPIXENT) 300 MG/2ML SOPN Inject 300 mg into the skin every 14 (fourteen) days.   EPINEPHrine 0.3 mg/0.3 mL IJ SOAJ injection Inject 0.3 mg into the muscle as needed for anaphylaxis.    fluticasone-salmeterol (ADVAIR DISKUS) 250-50 MCG/ACT AEPB Inhale 1 puff into the lungs in the morning and at bedtime.   ipratropium-albuterol (DUONEB) 0.5-2.5 (3) MG/3ML SOLN Take 3 mLs by nebulization 4 (four) times daily as needed (wheezing/shortness of breath).   KLOR-CON M20 20 MEQ tablet Take 20 mEq by mouth daily.   losartan-hydrochlorothiazide (HYZAAR) 100-25 MG tablet Take 1 tablet by mouth daily.   magnesium oxide (MAG-OX) 400 (241.3 Mg) MG tablet Take 1 tablet (400 mg total) by mouth daily. (Patient taking differently: Take 400 mg by mouth daily in the afternoon.)   metoprolol succinate (TOPROL-XL) 50 MG 24 hr tablet Take 1 tablet (50 mg total) by mouth daily.   montelukast (SINGULAIR) 10 MG tablet Take 10 mg by mouth daily.   Multiple Vitamin (MULTIVITAMIN WITH MINERALS) TABS tablet Take 1 tablet by mouth daily. (Patient taking differently: Take 1 tablet by mouth daily in the afternoon.)   pantoprazole (PROTONIX) 40 MG tablet Take 1 tablet (40 mg total) by mouth 2 (two) times daily.   Spacer/Aero-Holding Chambers (AEROCHAMBER MV) inhaler Use as instructed   [DISCONTINUED] methylPREDNISolone (MEDROL DOSEPAK) 4 MG TBPK tablet Take as directed in the package.   Immunization History  Administered Date(s) Administered   Influenza Inj Mdck Quad Pf 08/10/2019   Influenza Inj Mdck Quad With Preservative 07/18/2018   Influenza,inj,Quad PF,6+ Mos 10/15/2015, 08/31/2016   Influenza-Unspecified 08/10/2019, 07/19/2020   Moderna SARS-COV2 Booster Vaccination 10/23/2020   Moderna Sars-Covid-2 Vaccination 11/21/2019, 12/21/2019   Pneumococcal Polysaccharide-23 10/15/2015       Objective:   Physical Exam BP 128/78 (BP Location: Left Arm, Cuff Size: Normal)   Pulse 81   Temp 97.9 F (36.6 C) (Temporal)   Ht '5\' 7"'$  (1.702 m)   Wt 197 lb (89.4 kg)   SpO2 98%   BMI 30.85 kg/m  GENERAL: Chronically ill appearing gentleman, mild use of accessories, presents in transport chair.  Wearing oxygen  via nasal cannula.  No acute respiratory distress.  No plethora.  HEAD: Normocephalic, atraumatic. EYES: Pupils equal, round, reactive to light.  No scleral icterus. MOUTH: Nose/mouth/throat not examined due to masking requirements for COVID 19. NECK: Supple. No thyromegaly. Trachea midline. No JVD.  No adenopathy. PULMONARY: Increased AP diameter, significant kyphosis, coarse breath sounds, no wheezes.  No rhonchi.  Distant breath sounds. CARDIOVASCULAR: S1 and S2. Regular rate and rhythm.  Distant heart tones no murmur appreciated. GASTROINTESTINAL: Protuberant abdomen otherwise benign. MUSCULOSKELETAL: No joint deformity, no clubbing, no edema. NEUROLOGIC: No overt focal deficit.  Speech is fluent.  Awake, alert. SKIN: Intact,warm,dry. PSYCH: In good spirits today.  Behavior appropriate.  Last alpha 1 antitrypsin level was 111 mg/dL which is normal.  This  was on 30 October 2021.  He has MZ phenotype.    Assessment & Plan:     ICD-10-CM   1. Asthma-COPD overlap syndrome (HCC)  J44.9 AMB REFERRAL FOR DME   Continue Advair and DuoNebs Continue as needed albuterol Continue Dupixent    2. Alpha-1-antitrypsin deficiency carrier  Z14.8    MZ phenotype Last alpha level 12 January, normal    3. 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 Former smoker  Z87.891    No evidence of relapse     Orders Placed This Encounter  Procedures   AMB REFERRAL FOR DME    Referral Priority:   Routine    Referral Type:   Durable Medical Equipment Purchase    Number of Visits Requested:   1   We will see the patient in follow-up in 3 months time he is to call sooner should any new problems arise peer  C. Derrill Kay, MD Advanced Bronchoscopy PCCM [facility]    *This note was  dictated using voice recognition software/Dragon.  Despite best efforts to proofread, errors can occur which can change the meaning. Any transcriptional errors that result from this process are unintentional and may not be fully corrected at the time of dictation.

## 2022-05-25 NOTE — Telephone Encounter (Signed)
Note has been faxed to [facility] at provided fax number. Nothing further needed.

## 2022-06-01 ENCOUNTER — Telehealth: Payer: Self-pay | Admitting: Pulmonary Disease

## 2022-06-01 ENCOUNTER — Encounter: Payer: Self-pay | Admitting: *Deleted

## 2022-06-01 NOTE — Telephone Encounter (Signed)
Yes this will be okay.

## 2022-06-01 NOTE — Telephone Encounter (Signed)
Called patient to let him know the letter is read and to see if he wanted to pick up the letter or have it mailed.  He asked if we would fax it to the clerk of court for him.  I advised him I would call and get a fax #.  He would like the paperwork mailed to him after it is faxed to the clerk of court.  I verified the address we have on file is his correct address.

## 2022-06-01 NOTE — Telephone Encounter (Signed)
Dr Patsey Berthold, this pt asking for a new jury excuse letter.  The letter Dr Ashby Dawes did in 2019 was not permanent and now he has received jury summons. Please advise if you are okay to provide him with a permanent excuse since he is on o2 24/7. Letter on Margie's desk for reference.

## 2022-06-01 NOTE — Telephone Encounter (Signed)
[place] clerk of court at [phone], I reached the vm, I left a message requesting a fax # to fax the letter to.  Will await return call.

## 2022-06-01 NOTE — Telephone Encounter (Signed)
Letter given to Dr. Patsey Berthold for signature.  Letter signed by Dr. Patsey Berthold.

## 2022-06-01 NOTE — Telephone Encounter (Signed)
Letter done and given to [facility] to have Dr Darnell Level sign.

## 2022-06-02 NOTE — Telephone Encounter (Signed)
Spoke to clerk of court and received fax number of [phone]. Letter has been faxed to provided fax number. Received fax confirmation.  Letter mailed to patient at address on file.  Patient is aware and voiced his understanding.  Nothing further needed.

## 2022-06-15 ENCOUNTER — Encounter: Payer: Self-pay | Admitting: Emergency Medicine

## 2022-06-15 ENCOUNTER — Emergency Department: Payer: Medicare HMO

## 2022-06-15 ENCOUNTER — Other Ambulatory Visit: Payer: Self-pay

## 2022-06-15 ENCOUNTER — Emergency Department
Admission: EM | Admit: 2022-06-15 | Discharge: 2022-06-15 | Disposition: A | Discharge status: Home / Self Care | Payer: Medicare HMO | Attending: Emergency Medicine | Admitting: Emergency Medicine

## 2022-06-15 DIAGNOSIS — E785 Hyperlipidemia, unspecified: Secondary | ICD-10-CM | POA: Diagnosis not present

## 2022-06-15 DIAGNOSIS — M47812 Spondylosis without myelopathy or radiculopathy, cervical region: Secondary | ICD-10-CM | POA: Diagnosis not present

## 2022-06-15 DIAGNOSIS — M542 Cervicalgia: Secondary | ICD-10-CM | POA: Diagnosis present

## 2022-06-15 DIAGNOSIS — Z9981 Dependence on supplemental oxygen: Secondary | ICD-10-CM | POA: Diagnosis not present

## 2022-06-15 DIAGNOSIS — J449 Chronic obstructive pulmonary disease, unspecified: Secondary | ICD-10-CM | POA: Diagnosis not present

## 2022-06-15 DIAGNOSIS — I10 Essential (primary) hypertension: Secondary | ICD-10-CM | POA: Insufficient documentation

## 2022-06-15 DIAGNOSIS — W108XXA Fall (on) (from) other stairs and steps, initial encounter: Secondary | ICD-10-CM | POA: Diagnosis not present

## 2022-06-15 DIAGNOSIS — S161XXA Strain of muscle, fascia and tendon at neck level, initial encounter: Secondary | ICD-10-CM | POA: Diagnosis not present

## 2022-06-15 DIAGNOSIS — I4510 Unspecified right bundle-branch block: Secondary | ICD-10-CM | POA: Insufficient documentation

## 2022-06-15 DIAGNOSIS — S0990XA Unspecified injury of head, initial encounter: Secondary | ICD-10-CM | POA: Diagnosis not present

## 2022-06-15 DIAGNOSIS — R0602 Shortness of breath: Secondary | ICD-10-CM | POA: Diagnosis not present

## 2022-06-15 LAB — COMPREHENSIVE METABOLIC PANEL
ALT: 23 U/L (ref 0–44)
AST: 28 U/L (ref 15–41)
Albumin: 4.6 g/dL (ref 3.5–5.0)
Alkaline Phosphatase: 39 U/L (ref 38–126)
Anion gap: 13 (ref 5–15)
BUN: 13 mg/dL (ref 8–23)
CO2: 25 mmol/L (ref 22–32)
Calcium: 9.1 mg/dL (ref 8.9–10.3)
Chloride: 92 mmol/L — ABNORMAL LOW (ref 98–111)
Creatinine, Ser: 0.71 mg/dL (ref 0.61–1.24)
GFR, Estimated: >60 mL/min (ref >60)
Glucose, Bld: 115 mg/dL — ABNORMAL HIGH (ref 70–99)
Potassium: 4 mmol/L (ref 3.5–5.1)
Sodium: 130 mmol/L — ABNORMAL LOW (ref 135–145)
Total Bilirubin: 0.9 mg/dL (ref 0.3–1.2)
Total Protein: 7.4 g/dL (ref 6.5–8.1)

## 2022-06-15 LAB — BRAIN NATRIURETIC PEPTIDE: B Natriuretic Peptide: 15.9 pg/mL (ref 0.0–100.0)

## 2022-06-15 LAB — CBC WITH DIFFERENTIAL/PLATELET
Abs Immature Granulocytes: 0.03 10*3/uL (ref 0.00–0.07)
Basophils Absolute: 0 10*3/uL (ref 0.0–0.1)
Basophils Relative: 0 %
Eosinophils Absolute: 0 10*3/uL (ref 0.0–0.5)
Eosinophils Relative: 0 %
HCT: 37.6 % — ABNORMAL LOW (ref 39.0–52.0)
Hemoglobin: 12.5 g/dL — ABNORMAL LOW (ref 13.0–17.0)
Immature Granulocytes: 0 %
Lymphocytes Relative: 13 %
Lymphs Abs: 1 10*3/uL (ref 0.7–4.0)
MCH: 33.4 pg (ref 26.0–34.0)
MCHC: 33.2 g/dL (ref 30.0–36.0)
MCV: 100.5 fL — ABNORMAL HIGH (ref 80.0–100.0)
Monocytes Absolute: 0.8 10*3/uL (ref 0.1–1.0)
Monocytes Relative: 11 %
Neutro Abs: 5.4 10*3/uL (ref 1.7–7.7)
Neutrophils Relative %: 76 %
Platelets: 286 10*3/uL (ref 150–400)
RBC: 3.74 MIL/uL — ABNORMAL LOW (ref 4.22–5.81)
RDW: 13.2 % (ref 11.5–15.5)
WBC: 7.2 10*3/uL (ref 4.0–10.5)
nRBC: 0 % (ref 0.0–0.2)

## 2022-06-15 LAB — TROPONIN I (HIGH SENSITIVITY)
Troponin I (High Sensitivity): 4 ng/L (ref <18)
Troponin I (High Sensitivity): 4 ng/L (ref <18)

## 2022-06-15 MED ORDER — PREDNISONE 20 MG PO TABS: 40.0000 mg | ORAL_TABLET | Freq: Every day | ORAL | 0 refills | Status: AC

## 2022-06-15 MED ORDER — IPRATROPIUM-ALBUTEROL 0.5-2.5 (3) MG/3ML IN SOLN
3.0000 mL | Freq: Once | RESPIRATORY_TRACT | Status: AC
  Administered 2022-06-15: 3 mL via RESPIRATORY_TRACT
  Filled 2022-06-15: qty 3

## 2022-06-15 MED ORDER — STERILE WATER FOR INJECTION IJ SOLN
INTRAMUSCULAR | Status: AC
  Filled 2022-06-15: qty 10

## 2022-06-15 MED ORDER — METHYLPREDNISOLONE SODIUM SUCC 125 MG IJ SOLR
125.0000 mg | Freq: Once | INTRAMUSCULAR | Status: AC
Start: 2022-06-15 — End: 2022-06-15
  Administered 2022-06-15: 125 mg via INTRAVENOUS
  Filled 2022-06-15: qty 2

## 2022-06-15 MED ORDER — NAPROXEN 500 MG PO TABS: 500.0000 mg | ORAL_TABLET | Freq: Two times a day (BID) | ORAL | 0 refills | Status: AC

## 2022-06-15 MED ORDER — SODIUM CHLORIDE 0.9 % IV BOLUS
1000.0000 mL | Freq: Once | INTRAVENOUS | Status: AC
  Administered 2022-06-15: 1000 mL via INTRAVENOUS

## 2022-06-15 NOTE — Discharge Instructions (Addendum)
Take the Naprosyn twice daily for the next week.  This is an anti-inflammatory and should help with the neck pain.  You should also take the prednisone as prescribed as this will decrease inflammation and help with the pain as well as with your breathing.  Return to the ER for new, worsening, or persistent severe pain, weakness or numbness, increased difficulty breathing, lightheadedness, or any other new or worsening symptoms that concern you.

## 2022-06-15 NOTE — ED Provider Notes (Signed)
[facility] Provider Note    Event Date/Time   First MD Initiated Contact with Patient 06/15/22 1108     (approximate)   History   Neck Pain   HPI  Austin Patterson is a 66 y.o. male with a history of COPD on home O2, hypertension, hyperlipidemia, IPF, and GERD who presents with neck pain over the last few weeks, persistent course, bilateral, and worse when he moves around.  He states that he fell down 4 concrete steps in July and hit the back of his head.  The neck pain started around that time but it has been getting worse rather than improving.  He reports pain in the back of his head as well.  He denies any weakness or numbness.  He is able to walk without difficulty.  However the patient states that his shortness of breath has also increased in the last several days.  He denies fever but has had increased cough.  He has no chest pain.     Physical Exam   Triage Vital Signs: ED Triage Vitals  Enc Vitals Group     BP 06/15/22 1053 (!) 156/77     Pulse Rate 06/15/22 1053 84     Resp 06/15/22 1053 20     Temp 06/15/22 1053 97.9 F (36.6 C)     Temp Source 06/15/22 1053 Oral     SpO2 06/15/22 1053 98 %     Weight 06/15/22 1010 197 lb 1.5 oz (89.4 kg)     Height 06/15/22 1010 '5\' 7"'$  (1.702 m)     Head Circumference --      Peak Flow --      Pain Score 06/15/22 1010 8     Pain Loc --      Pain Edu? --      Excl. in [place]? --     Most recent vital signs: Vitals:   06/15/22 1053 06/15/22 1055  BP: (!) 156/77 (!) 156/77  Pulse: 84 85  Resp: 20 20  Temp: 97.9 F (36.6 C) 97.9 F (36.6 C)  SpO2: 98% 99%     General: Alert and oriented, no distress. CV:  Good peripheral perfusion.  Normal heart sounds. Resp:  Increased respiratory effort.  Diminished breath sounds bilaterally. Abd:  No distention.  Other:  Normal gait.  5/5 motor strength and intact sensation of bilateral upper and lower extremities.  No midline cervical spinal tenderness.  No  palpable thrill or any masses.   ED Results / Procedures / Treatments   Labs (all labs ordered are listed, but only abnormal results are displayed) Labs Reviewed  COMPREHENSIVE METABOLIC PANEL - Abnormal; Notable for the following components:      Result Value   Sodium 130 (*)    Chloride 92 (*)    Glucose, Bld 115 (*)    All other components within normal limits  CBC WITH DIFFERENTIAL/PLATELET - Abnormal; Notable for the following components:   RBC 3.74 (*)    Hemoglobin 12.5 (*)    HCT 37.6 (*)    MCV 100.5 (*)    All other components within normal limits  BRAIN NATRIURETIC PEPTIDE  TROPONIN I (HIGH SENSITIVITY)  TROPONIN I (HIGH SENSITIVITY)     EKG  ED ECG REPORT I, Arta Silence, the attending physician, personally viewed and interpreted this ECG.  Date: 06/15/2022 EKG Time: 1205 Rate: 85 Rhythm: normal sinus rhythm QRS Axis: normal Intervals: RBBB ST/T Wave abnormalities: normal Narrative Interpretation: no evidence  of acute ischemia    RADIOLOGY  Chest x-ray: I independently viewed and interpreted the images; there is no focal consolidation or edema  CT head: No acute findings.  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  CT cervical spine: No acute findings  PROCEDURES:  Critical Care performed: No  Procedures   MEDICATIONS ORDERED IN ED: Medications  ipratropium-albuterol (DUONEB) 0.5-2.5 (3) MG/3ML nebulizer solution 3 mL (3 mLs Nebulization Given 06/15/22 1244)  ipratropium-albuterol (DUONEB) 0.5-2.5 (3) MG/3ML nebulizer solution 3 mL (3 mLs Nebulization Given 06/15/22 1244)  methylPREDNISolone sodium succinate (SOLU-MEDROL) 125 mg/2 mL injection 125 mg (125 mg Intravenous Given 06/15/22 1309)  sterile water (preservative free) injection (  Given 06/15/22 1356)  sodium chloride 0.9 % bolus 1,000 mL (1,000 mLs Intravenous New Bag/Given 06/15/22 1403)     IMPRESSION / MDM / ASSESSMENT AND PLAN / ED COURSE  I reviewed the triage vital signs  and the nursing notes.  66 year old male with PMH as noted above presents with bilateral neck and upper back pain after a fall several weeks ago.  He did not seek care initially after the fall.  He also reports increased shortness of breath over the last several days.  I reviewed the past medical records.  Per the hospitalist discharge summary from 02/25/2022 the patient was admitted at that time with increased shortness of breath, cough, and wheezing.  He was placed on BiPAP and required admission for COPD exacerbation.  On exam today, the vital signs are normal except for hypertension.  Neurologic exam is nonfocal and the patient is able to ambulate.  He does appear short of breath with diminished breath sounds bilaterally.    For the neck pain, differential diagnosis includes, but is not limited to, muscle strain/spasm, contusion, fracture, radiculopathy.  The patient has no neurologic deficits.  We will obtain CT head and cervical spine to rule out subacute traumatic findings.  For the shortness of breath, differential diagnosis includes COPD exacerbation, acute bronchitis, pneumonia, less likely cardiac cause.  We will obtain chest x-ray, lab work-up, give bronchodilators and steroid and reassess.  Patient's presentation is most consistent with acute presentation with potential threat to life or bodily function.  The patient is on the cardiac monitor to evaluate for evidence of arrhythmia and/or significant heart rate changes.  ----------------------------------------- 3:24 PM on 06/15/2022 -----------------------------------------  Work-up is overall reassuring.  Troponins are negative x2.  There is no leukocytosis.  Electrolytes are normal.  BNP is normal.  Chest x-ray shows no acute findings.  CT head and C-spine do not show acute findings.  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  The  patient states he felt dehydrated so we give some fluids.  He states he is feeling significantly better.  I did consider whether he may require admission given his history of COPD and presentation with increased shortness of breath and possible COPD exacerbation.  However, the patient states he would strongly prefer to go home.  Given that he is not requiring any increased oxygen from baseline, his vital signs are otherwise normal, and the work-up is reassuring, he is appropriate for discharge.  I will prescribe Naprosyn and prednisone for the neck pain as well as for COPD.  I counseled the patient on the results of the work-up.  I given strict return precautions and he expressed understanding.   FINAL CLINICAL IMPRESSION(S) / ED DIAGNOSES  Final diagnoses:  Cervical strain, initial encounter  Chronic obstructive pulmonary disease, unspecified COPD type ([place])     Rx / DC Orders   ED Discharge Orders          Ordered    naproxen (NAPROSYN) 500 MG tablet  2 times daily with meals        06/15/22 1523    predniSONE (DELTASONE) 20 MG tablet  Daily with breakfast        06/15/22 1523             Note:  This document was prepared using Dragon voice recognition software and may include unintentional dictation errors.    Arta Silence, MD 06/15/22 1525

## 2022-06-15 NOTE — ED Triage Notes (Signed)
First Nurse Note:  Arrives via [facility].  From Home.  Had a fall in July down concrete steps.  Since fall, generalized neck pain.    VS wnl.

## 2022-06-15 NOTE — ED Notes (Signed)
See triage note  Presents s/p fall last month   States he is still having neck pain    Pt is also SOB on arrival   no fever

## 2022-07-10 ENCOUNTER — Other Ambulatory Visit: Payer: Self-pay | Admitting: Pulmonary Disease

## 2022-07-20 ENCOUNTER — Ambulatory Visit: Payer: Medicare HMO | Attending: Cardiology | Admitting: Cardiology

## 2022-07-20 ENCOUNTER — Encounter: Payer: Self-pay | Admitting: Cardiology

## 2022-07-20 VITALS — BP 124/60 | HR 104 | Ht 65.0 in | Wt 202.6 lb

## 2022-07-20 DIAGNOSIS — I2510 Atherosclerotic heart disease of native coronary artery without angina pectoris: Secondary | ICD-10-CM | POA: Diagnosis not present

## 2022-07-20 DIAGNOSIS — R0609 Other forms of dyspnea: Secondary | ICD-10-CM

## 2022-07-20 DIAGNOSIS — I10 Essential (primary) hypertension: Secondary | ICD-10-CM

## 2022-07-20 NOTE — Progress Notes (Signed)
Cardiology Office Note:    Date:  07/20/2022   ID:  Austin Patterson, DOB [birth year], MRN [id number]  PCP:  Rutherford Limerick, PA   [facility] Cardiologist:  Kate Sable, MD     Referring MD: Rutherford Limerick, PA   No chief complaint on file.   History of Present Illness:    Austin Patterson is a 66 y.o. male with a hx of CAD (PCI to RCA 2009), hypertension, hyperlipidemia, COPD on oxygen, pulmonary fibrosis, former smoker x40+ years who presents to establish care.  He states having symptoms of chest pain back in 2009, evaluated at [facility] cardiology where stress test was performed and noted to be abnormal.  Underwent heart cath showing significant stenosis in RCA, stent was placed.  Denies chest pain, has been doing okay since then, endorses shortness of breath attributable to his COPD and pulmonary fibrosis.  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  Follows up with pulmonary medicine.   Past Medical History:  Diagnosis Date   COPD (chronic obstructive pulmonary disease) (HCC)    GERD (gastroesophageal reflux disease)    Hyperlipidemia    Hypertension    Pulmonary fibrosis ([place]) 11/2015    Past Surgical History:  Procedure Laterality Date   COLONOSCOPY     CORONARY STENT PLACEMENT     ESOPHAGOGASTRODUODENOSCOPY (EGD) WITH PROPOFOL N/A 09/23/2016   Procedure: ESOPHAGOGASTRODUODENOSCOPY (EGD) WITH PROPOFOL;  Surgeon: [name], MD;  Location: [facility] ENDOSCOPY;  Service: Endoscopy;  Laterality: N/A;   KYPHOPLASTY N/A 03/14/2020   Procedure: T7 & T11 KYPHOPLASTY;  Surgeon: Hessie Knows, MD;  Location: [facility] ORS;  Service: Orthopedics;  Laterality: N/A;   SHOULDER ACROMIOPLASTY      Current Medications: Current Meds  Medication Sig   acetaminophen (TYLENOL) 500 MG tablet Take 1,000 mg by mouth every 6 (six) hours as needed for moderate pain or headache.   albuterol (VENTOLIN HFA) 108 (90 Base) MCG/ACT inhaler Inhale 2 puffs into the lungs every  6 (six) hours as needed for wheezing or shortness of breath.   amLODipine (NORVASC) 10 MG tablet Take 1 tablet (10 mg total) by mouth daily.   aspirin EC 81 MG tablet Take 1 tablet (81 mg total) by mouth daily.   atorvastatin (LIPITOR) 40 MG tablet Take 1 tablet (40 mg total) by mouth at bedtime.   CALCIUM 600/VITAMIN D 600-10 MG-MCG TABS Take 1 tablet by mouth 2 (two) times daily.   clopidogrel (PLAVIX) 75 MG tablet Take 1 tablet (75 mg total) by mouth daily.   Dupilumab (DUPIXENT) 300 MG/2ML SOPN Inject 300 mg into the skin every 14 (fourteen) days.   fluticasone-salmeterol (ADVAIR DISKUS) 250-50 MCG/ACT AEPB Inhale 1 puff into the lungs in the morning and at bedtime.   ipratropium-albuterol (DUONEB) 0.5-2.5 (3) MG/3ML SOLN INHALE 1 VIAL THROUGH NEBULIZER EVERY 6 HOURS   KLOR-CON M20 20 MEQ tablet Take 20 mEq by mouth daily.   losartan-hydrochlorothiazide (HYZAAR) 100-25 MG tablet Take 1 tablet by mouth daily.   magnesium oxide (MAG-OX) 400 (241.3 Mg) MG tablet Take 1 tablet (400 mg total) by mouth daily.   metoprolol succinate (TOPROL-XL) 50 MG 24 hr tablet Take 1 tablet (50 mg total) by mouth daily.   montelukast (SINGULAIR) 10 MG tablet Take 10 mg by mouth daily.   Multiple Vitamin (MULTIVITAMIN WITH MINERALS) TABS tablet Take 1 tablet by mouth daily.   pantoprazole (PROTONIX) 40 MG tablet Take 1 tablet (40 mg total) by mouth 2 (two) times daily.  Spacer/Aero-Holding Chambers (AEROCHAMBER MV) inhaler Use as instructed     Allergies:   Amoxicillin and Tizanidine   Social History   Socioeconomic History   Marital status: Legally Separated    Spouse name: Not on file   Number of children: Not on file   Years of education: Not on file   Highest education level: Not on file  Occupational History   Not on file  Tobacco Use   Smoking status: Former    Packs/day: 2.00    Years: 50.00    Total pack years: 100.00    Types: Cigarettes    Quit date: 10/14/2015    Years since quitting:  6.7   Smokeless tobacco: Former  Scientific laboratory technician Use: Never used  Substance and Sexual Activity   Alcohol use: Yes    Alcohol/week: 28.0 standard drinks of alcohol    Types: 28 Cans of beer per week    Comment: occ   Drug use: No   Sexual activity: Not on file  Other Topics Concern   Not on file  Social History Narrative   Not on file   Social Determinants of Health   Financial Resource Strain: Not on file  Food Insecurity: Not on file  Transportation Needs: Not on file  Physical Activity: Not on file  Stress: Not on file  Social Connections: Not on file     Family History: The patient's family history includes Heart disease in his mother.  ROS:   Please see the history of present illness.     All other systems reviewed and are negative.  EKGs/Labs/Other Studies Reviewed:    The following studies were reviewed today:   EKG:  EKG is  ordered today.  The ekg ordered today demonstrates sinus tachycardia heart rate 104 right bundle branch block  Recent Labs: 06/15/2022: ALT 23; B Natriuretic Peptide 15.9; BUN 13; Creatinine, Ser 0.71; Hemoglobin 12.5; Platelets 286; Potassium 4.0; Sodium 130  Recent Lipid Panel    Component Value Date/Time   CHOL 149 01/25/2022 0541   TRIG 223 (H) 01/25/2022 0541   HDL 59 01/25/2022 0541   CHOLHDL 2.5 01/25/2022 0541   VLDL 45 (H) 01/25/2022 0541   LDLCALC 45 01/25/2022 0541     Risk Assessment/Calculations:             Physical Exam:    VS:  BP 124/60   Pulse (!) 104   Ht '5\' 5"'$  (1.651 m)   Wt 202 lb 9.6 oz (91.9 kg)   SpO2 96%   BMI 33.71 kg/m     Wt Readings from Last 3 Encounters:  07/20/22 202 lb 9.6 oz (91.9 kg)  06/15/22 197 lb 1.5 oz (89.4 kg)  05/21/22 197 lb (89.4 kg)     GEN:  Well nourished, mild respiratory distress HEENT: Normal NECK: No JVD; No carotid bruits CARDIAC: Regular, tachycardic RESPIRATORY: Decreased breath sounds, mild expiratory wheezing ABDOMEN: Soft, non-tender,  non-distended MUSCULOSKELETAL:  No edema; No deformity  SKIN: Warm and dry NEUROLOGIC:  Alert and oriented x 3 PSYCHIATRIC:  Normal affect   ASSESSMENT:    1. Coronary artery disease involving native heart, unspecified vessel or lesion type, unspecified whether angina present   2. Primary hypertension   3. Dyspnea on exertion    PLAN:    In order of problems listed above:  CAD s/p PCI to RCA 2009.  Denies chest pain, endorses dyspnea on exertion.  Shortness of breath could be secondary to COPD.  Get  echo, get [employer].  Continue aspirin, Plavix, Lipitor. Hypertension, BP controlled.  Continue Hyzaar, Toprol-XL, Norvasc. Dyspnea on exertion, likely from COPD/pulmonary fibrosis requiring oxygen.  Cardiac work-up as above.  Follow-up after cardiac testing       Medication Adjustments/Labs and Tests Ordered: Current medicines are reviewed at length with the patient today.  Concerns regarding medicines are outlined above.  Orders Placed This Encounter  Procedures   NM Myocar Multi W/Spect W/Wall Motion / EF   EKG 12-Lead   ECHOCARDIOGRAM COMPLETE   No orders of the defined types were placed in this encounter.   Patient Instructions  Medication Instructions:   Your physician recommends that you continue on your current medications as directed. Please refer to the Current Medication list given to you today.  *If you need a refill on your cardiac medications before your next appointment, please call your pharmacy*   Testing/Procedures:  Your physician has requested that you have an echocardiogram. Echocardiography is a painless test that uses sound waves to create images of your heart. It provides your doctor with information about the size and shape of your heart and how well your heart's chambers and valves are working. This procedure takes approximately one hour. There are no restrictions for this procedure.  2.   [place]     Your caregiver has ordered a  Stress Test with nuclear imaging. The purpose of this test is to evaluate the blood supply to your heart muscle. This procedure is referred to as a "Non-Invasive Stress Test." This is because other than having an IV started in your vein, nothing is inserted or "invades" your body. Cardiac stress tests are done to find areas of poor blood flow to the heart by determining the extent of coronary artery disease (CAD). Some patients exercise on a treadmill, which naturally increases the blood flow to your heart, while others who are  unable to walk on a treadmill due to physical limitations have a pharmacologic/chemical stress agent called Lexiscan . This medicine will mimic walking on a treadmill by temporarily increasing your coronary blood flow.      PLEASE REPORT TO [facility] MEDICAL MALL ENTRANCE   THE VOLUNTEERS AT THE FIRST DESK WILL DIRECT YOU WHERE TO GO   *Please note: these test may take anywhere between 2-4 hours to complete       Date of Procedure:_____________________________________   Arrival Time for Procedure:______________________________    PLEASE NOTIFY THE OFFICE AT LEAST 24 HOURS IN ADVANCE IF YOU ARE UNABLE TO KEEP YOUR APPOINTMENT.  [place] 24 HOURS IN ADVANCE IF YOU ARE UNABLE TO KEEP YOUR APPOINTMENT. [phone]       How to prepare for your Myoview test:         ____:  Hold diabetes medication the morning of procedure:   ____:  Hold betablocker(s) night before procedure and morning of procedure (onlyfor exercise or dobutamine studies )   ____:  Hold other medications as follows:     1. Do not eat or drink after midnight  2. No caffeine for 24 hours prior to test  3. No smoking 24 hours prior to test.  4. Unless instructed otherwise, Take your medication with a small sips of water.    5.         Ladies, please do not wear dresses. Skirts or pants are appropriate. Please wear a short sleeve shirt.  6. No  perfume, cologne or lotion.  7. Wear comfortable walking shoes. No heels!    Follow-Up: At [facility], you and your health needs are our priority.  As part of our continuing mission to provide you with exceptional heart care, we have created designated Provider Care Teams.  These Care Teams include your primary Cardiologist (physician) and Advanced Practice Providers (APPs -  Physician Assistants and Nurse Practitioners) who all work together to provide you with the care you need, when you need it.  We recommend signing up for the patient portal called "MyChart".  Sign up information is provided on this After Visit Summary.  MyChart is used to connect with patients for Virtual Visits (Telemedicine).  Patients are able to view lab/test results, encounter notes, upcoming appointments, etc.  Non-urgent messages can be sent to your provider as well.   To learn more about what you can do with MyChart, go to [email or web address].    Your next appointment:   Follow up after testing   The format for your next appointment:   In Person  Provider:   You may see Kate Sable, MD or one of the following Advanced Practice Providers on your designated Care Team:   Murray Hodgkins, NP Christell Faith, PA-C Cadence Kathlen Mody, PA-C Gerrie Nordmann, NP    Other Instructions   Important Information About Sugar         Signed, Kate Sable, MD  07/20/2022 4:11 PM    [place]

## 2022-07-20 NOTE — Patient Instructions (Signed)
Medication Instructions:   Your physician recommends that you continue on your current medications as directed. Please refer to the Current Medication list given to you today.  *If you need a refill on your cardiac medications before your next appointment, please call your pharmacy*   Testing/Procedures:  Your physician has requested that you have an echocardiogram. Echocardiography is a painless test that uses sound waves to create images of your heart. It provides your doctor with information about the size and shape of your heart and how well your heart's chambers and valves are working. This procedure takes approximately one hour. There are no restrictions for this procedure.  2.   [facility]     Your caregiver has ordered a Stress Test with nuclear imaging. The purpose of this test is to evaluate the blood supply to your heart muscle. This procedure is referred to as a "Non-Invasive Stress Test." This is because other than having an IV started in your vein, nothing is inserted or "invades" your body. Cardiac stress tests are done to find areas of poor blood flow to the heart by determining the extent of coronary artery disease (CAD). Some patients exercise on a treadmill, which naturally increases the blood flow to your heart, while others who are  unable to walk on a treadmill due to physical limitations have a pharmacologic/chemical stress agent called Lexiscan . This medicine will mimic walking on a treadmill by temporarily increasing your coronary blood flow.      PLEASE REPORT TO [facility] MEDICAL MALL ENTRANCE   THE VOLUNTEERS AT THE FIRST DESK WILL DIRECT YOU WHERE TO GO   *Please note: these test may take anywhere between 2-4 hours to complete       Date of Procedure:_____________________________________   Arrival Time for Procedure:______________________________    PLEASE NOTIFY THE OFFICE AT LEAST 24 HOURS IN ADVANCE IF YOU ARE UNABLE TO KEEP YOUR APPOINTMENT.  [place] 24 HOURS IN ADVANCE IF YOU ARE UNABLE TO KEEP YOUR APPOINTMENT. [phone]       How to prepare for your Myoview test:         ____:  Hold diabetes medication the morning of procedure:   ____:  Hold betablocker(s) night before procedure and morning of procedure (onlyfor exercise or dobutamine studies )   ____:  Hold other medications as follows:     1. Do not eat or drink after midnight  2. No caffeine for 24 hours prior to test  3. No smoking 24 hours prior to test.  4. Unless instructed otherwise, Take your medication with a small sips of water.    5.         Ladies, please do not wear dresses. Skirts or pants are appropriate. Please wear a short sleeve shirt.  6. No perfume, cologne or lotion.  7. Wear comfortable walking shoes. No heels!    Follow-Up: At [facility], you and your health needs are our priority.  As part of our continuing mission to provide you with exceptional heart care, we have created designated Provider Care Teams.  These Care Teams include your primary Cardiologist (physician) and Advanced Practice Providers (APPs -  Physician Assistants and Nurse Practitioners) who all work together to provide you with the care you need, when you need it.  We recommend signing up for the patient portal called "MyChart".  Sign up information is provided on this After Visit Summary.  MyChart is used to  connect with patients for Virtual Visits (Telemedicine).  Patients are able to view lab/test results, encounter notes, upcoming appointments, etc.  Non-urgent messages can be sent to your provider as well.   To learn more about what you can do with MyChart, go to [email or web address].    Your next appointment:   Follow up after testing   The format for your next appointment:   In Person  Provider:   You may see Kate Sable, MD or one of the following Advanced Practice Providers on your designated Care  Team:   Murray Hodgkins, NP Christell Faith, PA-C Cadence Kathlen Mody, PA-C Gerrie Nordmann, NP    Other Instructions   Important Information About Sugar

## 2022-07-27 ENCOUNTER — Encounter
Admission: RE | Admit: 2022-07-27 | Discharge: 2022-07-27 | Disposition: A | Discharge status: Home / Self Care | Payer: Medicare HMO | Source: Ambulatory Visit | Attending: Cardiology | Admitting: Cardiology

## 2022-07-27 DIAGNOSIS — I2510 Atherosclerotic heart disease of native coronary artery without angina pectoris: Secondary | ICD-10-CM | POA: Diagnosis present

## 2022-07-27 DIAGNOSIS — R0609 Other forms of dyspnea: Secondary | ICD-10-CM | POA: Insufficient documentation

## 2022-07-27 LAB — NM MYOCAR MULTI W/SPECT W/WALL MOTION / EF
LV dias vol: 51 mL (ref 62–150)
LV sys vol: 14 mL
Nuc Stress EF: 73 %
Peak HR: 100 {beats}/min
Percent HR: 64 %
Rest HR: 88 {beats}/min
Rest Nuclear Isotope Dose: 10.5 mCi
SDS: 0
SRS: 0
SSS: 0
ST Depression (mm): 0 mm
Stress Nuclear Isotope Dose: 32.5 mCi
TID: 1.1

## 2022-07-27 MED ORDER — TECHNETIUM TC 99M TETROFOSMIN IV KIT
32.4700 | PACK | Freq: Once | INTRAVENOUS | Status: AC | PRN
  Administered 2022-07-27: 32.47 via INTRAVENOUS

## 2022-07-27 MED ORDER — REGADENOSON 0.4 MG/5ML IV SOLN
0.4000 mg | Freq: Once | INTRAVENOUS | Status: AC
  Administered 2022-07-27: 0.4 mg via INTRAVENOUS

## 2022-07-27 MED ORDER — TECHNETIUM TC 99M TETROFOSMIN IV KIT
10.4500 | PACK | Freq: Once | INTRAVENOUS | Status: AC | PRN
  Administered 2022-07-27: 10.45 via INTRAVENOUS

## 2022-07-29 ENCOUNTER — Telehealth: Payer: Self-pay | Admitting: Cardiology

## 2022-08-21 ENCOUNTER — Encounter: Payer: Self-pay | Admitting: Pulmonary Disease

## 2022-08-21 ENCOUNTER — Ambulatory Visit: Payer: Medicare HMO | Admitting: Pulmonary Disease

## 2022-08-21 VITALS — BP 132/86 | HR 93 | Temp 98.6°F | Ht 65.0 in | Wt 203.0 lb

## 2022-08-21 DIAGNOSIS — J4550 Severe persistent asthma, uncomplicated: Secondary | ICD-10-CM

## 2022-08-21 DIAGNOSIS — E8801 Alpha-1-antitrypsin deficiency: Secondary | ICD-10-CM

## 2022-08-21 DIAGNOSIS — Z87891 Personal history of nicotine dependence: Secondary | ICD-10-CM

## 2022-08-21 DIAGNOSIS — J4489 Other specified chronic obstructive pulmonary disease: Secondary | ICD-10-CM | POA: Diagnosis not present

## 2022-08-21 DIAGNOSIS — J9611 Chronic respiratory failure with hypoxia: Secondary | ICD-10-CM | POA: Diagnosis not present

## 2022-08-21 NOTE — Patient Instructions (Signed)
Continue taking your Advair and DuoNeb.  Continue Dupixent.  We will consider pulmonary rehabilitation in the springtime.  We will see you in follow-up in 4 months time call sooner should any new problems arise.

## 2022-08-21 NOTE — Progress Notes (Signed)
Subjective:    Patient ID: Austin Patterson, male    DOB: 1956/06/26, 66 y.o.   MRN: [id number] Patient Care Team: Rutherford Limerick, PA as PCP - General (Physician Assistant) Kate Sable, MD as PCP - Cardiology (Cardiology) Tyler Pita, MD as Consulting Physician (Pulmonary Disease)  Chief Complaint  Patient presents with   Follow-up    Asthma-COPD. SOB with exertion. No wheezing or cough. 2L O2 Constant.   HPI Austin Patterson is a 66 year old with COPD (FEV1 1.23 L or 39% predicted, 03/26/2019) and asthma, IgE at 1950 directed to Aspergillus fumigatus (ABPA) and underlying pulmonary fibrosis, who presents for follow-up.  This is a scheduled visit.  He was last seen on 66 August 2023 and at that time was doing well.  He has chronic respiratory failure with hypoxia due to the issues noted above.  He is compliant with oxygen at 2 L/min 24/7.  He has alpha-1 antitrypsin phenotype MZ but normal alpha-1 levels (level was 111 on 10/30/2021).  He is currently on Dupixent for management of his asthma component.  On today's visit he reports that his dyspnea is at baseline with no new complaint.  He has been compliant with his Dupixent and states that he is doing well.  He has had no fevers, chills or sweats.  No increase in cough or sputum production.  No hemoptysis. He notes no other issues.  He has not had any chest pain, no recent sick contacts.  Does not endorse any other symptomatology.  He has not required steroid pulses since his last visit.  No recent exacerbations.  Review of Systems A 10 point review of systems was performed and it is as noted above otherwise negative.  Patient Active Problem List   Diagnosis Date Noted   COPD exacerbation ([place]) 02/23/2022   GERD without esophagitis 02/23/2022   Chest pain 01/24/2022   Alpha-1-antitrypsin deficiency carrier 01/10/2021   Asthma 03/12/2020   Chronic respiratory failure with hypoxia ([place]) 03/12/2020   Hyperlipidemia    COPD (chronic  obstructive pulmonary disease) (HCC)    Bleeding nose    Alcohol abuse    RUQ abdominal pain    Anaphylaxis 11/01/2019   Hypotension 11/01/2019   CAD (coronary artery disease) 11/01/2019   H/O heart artery stent 11/01/2019   Acute on chronic respiratory failure with hypoxia (HCC)    RUQ pain    Essential hypertension    IPF (idiopathic pulmonary fibrosis) ([place]) 08/30/2019   Hypomagnesemia 03/31/2017   Hiatal hernia    Reflux esophagitis    Hematemesis without nausea    Hyponatremia 09/21/2016   Multifocal pneumonia 12/09/2015   Social History   Tobacco Use   Smoking status: Former    Packs/day: 2.00    Years: 50.00    Total pack years: 100.00    Types: Cigarettes    Quit date: 10/14/2015    Years since quitting: 6.8   Smokeless tobacco: Former  Substance Use Topics   Alcohol use: Yes    Alcohol/week: 28.0 standard drinks of alcohol    Types: 28 Cans of beer per week    Comment: occ   Allergies  Allergen Reactions   Amoxicillin Anaphylaxis   Tizanidine     Feet and ankle swell    Current Meds  Medication Sig   acetaminophen (TYLENOL) 500 MG tablet Take 1,000 mg by mouth every 6 (six) hours as needed for moderate pain or headache.   albuterol (VENTOLIN HFA) 108 (90 Base) MCG/ACT inhaler Inhale 2  puffs into the lungs every 6 (six) hours as needed for wheezing or shortness of breath.   amLODipine (NORVASC) 10 MG tablet Take 1 tablet (10 mg total) by mouth daily.   aspirin EC 81 MG tablet Take 1 tablet (81 mg total) by mouth daily.   atorvastatin (LIPITOR) 40 MG tablet Take 1 tablet (40 mg total) by mouth at bedtime.   CALCIUM 600/VITAMIN D 600-10 MG-MCG TABS Take 1 tablet by mouth 2 (two) times daily.   clopidogrel (PLAVIX) 75 MG tablet Take 1 tablet (75 mg total) by mouth daily.   Dupilumab (DUPIXENT) 300 MG/2ML SOPN Inject 300 mg into the skin every 14 (fourteen) days.   fluticasone-salmeterol (ADVAIR DISKUS) 250-50 MCG/ACT AEPB Inhale 1 puff into the lungs in the  morning and at bedtime.   ipratropium-albuterol (DUONEB) 0.5-2.5 (3) MG/3ML SOLN INHALE 1 VIAL THROUGH NEBULIZER EVERY 6 HOURS   KLOR-CON M20 20 MEQ tablet Take 20 mEq by mouth daily.   losartan-hydrochlorothiazide (HYZAAR) 100-25 MG tablet Take 1 tablet by mouth daily.   magnesium oxide (MAG-OX) 400 (241.3 Mg) MG tablet Take 1 tablet (400 mg total) by mouth daily.   metoprolol succinate (TOPROL-XL) 50 MG 24 hr tablet Take 1 tablet (50 mg total) by mouth daily.   montelukast (SINGULAIR) 10 MG tablet Take 10 mg by mouth daily.   Multiple Vitamin (MULTIVITAMIN WITH MINERALS) TABS tablet Take 1 tablet by mouth daily.   pantoprazole (PROTONIX) 40 MG tablet Take 1 tablet (40 mg total) by mouth 2 (two) times daily.   Spacer/Aero-Holding Chambers (AEROCHAMBER MV) inhaler Use as instructed   Immunization History  Administered Date(s) Administered   Influenza Inj Mdck Quad Pf 08/10/2019   Influenza Inj Mdck Quad With Preservative 07/18/2018   Influenza,inj,Quad PF,6+ Mos 10/15/2015, 08/31/2016   Influenza-Unspecified 08/10/2019, 07/19/2020, 07/19/2022   Moderna SARS-COV2 Booster Vaccination 10/23/2020   Moderna Sars-Covid-2 Vaccination 11/21/2019, 12/21/2019   Pneumococcal Polysaccharide-23 10/15/2015       Objective:   Physical Exam BP 132/86 (BP Location: Left Wrist, Cuff Size: Normal)   Pulse 93   Temp 98.6 F (37 C)   Ht '5\' 5"'$  (1.651 m)   Wt 203 lb (92.1 kg) Comment: Per patient. in a wheelchair  SpO2 97%   BMI 33.78 kg/m  GENERAL: Chronically ill appearing gentleman, mild use of accessories, presents in transport chair.  Wearing oxygen via nasal cannula.  No acute respiratory distress.  No plethora.  HEAD: Normocephalic, atraumatic. EYES: Pupils equal, round, reactive to light.  No scleral icterus. MOUTH: Oral mucosa moist, no thrush. NECK: Supple. No thyromegaly. Trachea midline. No JVD.  No adenopathy. PULMONARY: Increased AP diameter, significant kyphosis, coarse breath sounds,  no wheezes.  Scattered rhonchi throughout.  Distant breath sounds. CARDIOVASCULAR: S1 and S2. Regular rate and rhythm.  Distant heart tones no murmur appreciated. GASTROINTESTINAL: Protuberant abdomen otherwise benign. MUSCULOSKELETAL: No joint deformity, no clubbing, no edema. NEUROLOGIC: No overt focal deficit.  Speech is fluent.  Awake, alert. SKIN: Intact,warm,dry. PSYCH: In good spirits today.  Behavior appropriate.  Attempted nitric oxide determination however patient could not perform maneuver.    Assessment & Plan:     ICD-10-CM   1. Asthma-COPD overlap syndrome  J44.89    Continue Advair and DuoNebs as he is doing Continue as needed albuterol    2. Severe persistent asthma without complication  F16.38    On Dupixent twice monthly    3. Alpha-1-antitrypsin deficiency (HCC) MZ Phenotype  E88.01    Last alpha-1 level 111  0n 10/30/2021    4. Chronic respiratory failure with hypoxia (HCC)  J96.11    Compliant with oxygen at 2 L/min 24/7 Continue same    5. Former smoker  Z87.891    No evidence of relapse      Appears well compensated at present.  Continue current medications as they are.  We discussed consideration of pulmonary rehabilitation and he states he will consider it in "the springtime".  We will see the patient in follow-up in 4 months time he is to call sooner should any new problems arise.  Renold Don, MD Advanced Bronchoscopy PCCM  [facility]    *This note was dictated using voice recognition software/Dragon.  Despite best efforts to proofread, errors can occur which can change the meaning. Any transcriptional errors that result from this process are unintentional and may not be fully corrected at the time of dictation.

## 2022-08-26 ENCOUNTER — Ambulatory Visit: Payer: Medicare HMO | Attending: Cardiology

## 2022-08-26 DIAGNOSIS — I2510 Atherosclerotic heart disease of native coronary artery without angina pectoris: Secondary | ICD-10-CM

## 2022-08-26 DIAGNOSIS — R0609 Other forms of dyspnea: Secondary | ICD-10-CM

## 2022-08-26 LAB — ECHOCARDIOGRAM COMPLETE
AR max vel: 3.23 cm2
AV Area VTI: 3.26 cm2
AV Area mean vel: 2.8 cm2
AV Mean grad: 4 mmHg
AV Peak grad: 7.2 mmHg
Ao pk vel: 1.34 m/s
Area-P 1/2: 4.39 cm2
Calc EF: 56.8 %
S' Lateral: 2.7 cm
Single Plane A2C EF: 53.9 %
Single Plane A4C EF: 60.5 %

## 2022-09-28 ENCOUNTER — Telehealth: Payer: Self-pay

## 2022-09-28 NOTE — Telephone Encounter (Signed)
ATC pt and LVM regarding PAP re-enrollment for calendar year 2024. Informed pt that I would be placing renewal application in the mail and requested that they contact me directly if with any questions or concerns. Direct office number provided, will await f/u.

## 2022-09-30 NOTE — Telephone Encounter (Signed)
Pt returned call stating that he had already received and resubmitted renewal application. Advised him to simply disregard the form I mailed to him as long as it is the same exact form he has already completed. Pt verbalized understanding.  Will await further correspondence from [employer].

## 2022-10-01 ENCOUNTER — Other Ambulatory Visit: Payer: Self-pay | Admitting: Pulmonary Disease

## 2022-10-21 NOTE — Telephone Encounter (Signed)
Email has been sent to [facility] by [facility] regarding an update on this patient. Paitent had stated he sent in form but we received fax from [employer] stating they have not received.Knox Saliva, PharmD, MPH, BCPS, CPP Clinical Pharmacist (Rheumatology and Pulmonology)

## 2022-10-28 ENCOUNTER — Telehealth: Payer: Self-pay | Admitting: Pulmonary Disease

## 2022-10-28 NOTE — Telephone Encounter (Signed)
Received a fax from  [facility] regarding an approval for [place] patient assistance from 10/28/2022 to 10/19/2023. Approval letter sent to scan center.  Phone number: [phone]  ATC patient but phone went straight to VM. Left VM advising of approval and that he can call company to schedule refill of Dupixent to home if he due.  Knox Saliva, PharmD, MPH, BCPS, CPP Clinical Pharmacist (Rheumatology and Pulmonology)

## 2022-10-28 NOTE — Telephone Encounter (Signed)
Pharmacy called to request clarification on a script for Dupixant for patient.  Stated that there is no strength or dosage listed on the prescription.  Please call pharmacy to discuss at [phone], Fax# [phone]

## 2022-10-28 NOTE — Telephone Encounter (Signed)
Prescription is not being [facility] to [facility]. Closing this as [facility] has had extensive conversation with [facility] regarding this. Closing encounter.  Knox Saliva, PharmD, MPH, BCPS, CPP Clinical Pharmacist (Rheumatology and Pulmonology)

## 2022-10-28 NOTE — Telephone Encounter (Signed)
Per response from [facility]: pt had indicated on the [employer] renewal form that he had changed insurances at the start of 2024, therefore they are needing another BIV to be completed through new plan. Additionally, pt had listed his provider as Dr. Barnetta Chapel" Gonzalez--which created additional questions on their side as to whether this was accidental or intentional on the pt's part.  Further complicating matters were repeated notifications that the pt had been inexplicably [facility] despite the program requirements of filling through [place]. I spoke with a [facility] rep today (10/28/22) and attempted to obtain additional information, upon further inspection of the notes on her end she realized that the notes were dated back in 2022 and apparently could not figure out when/how they most recently received the referral for this pt.   I advised the rep that the pt would continue to fill through the [facility] program and that she could go ahead and close out this referral on their end.  **Based on previous experience I have to assume that the insurance company themselves noted the specialty medication on the pt's med list and, without any further investigation into WHY the pt was filling at that specific pharmacy, automatically referred the pt per their own policy requirements (as he would be locked in [facility] otherwise).   Will investigate further regarding pt's new insurance information and begin the new BIV required to continue receiving med through PAP.

## 2022-10-30 ENCOUNTER — Telehealth: Payer: Self-pay | Admitting: Pulmonary Disease

## 2022-10-30 NOTE — Telephone Encounter (Signed)
Be advised that if [facility] calls back regarding this pt's [facility]ent please inform them that we have already requested that they close this pt's referral as he will continue to receive medication through Patient Assistance Program and will not be filling through them.

## 2022-10-30 NOTE — Telephone Encounter (Signed)
Pharm calling for Dupixent needed   [facility]  [phone]  Acct # [id number]  Cecille Rubin

## 2022-11-03 ENCOUNTER — Telehealth: Payer: Self-pay | Admitting: Pharmacist

## 2022-11-03 NOTE — Telephone Encounter (Signed)
Received notification from [place] stating that patient's Dupixent will need PA approved and PA approval sent to [employer]. Submitted a Prior Authorization request to [facility], [facility] for [place] via CoverMyMeds. Will update once we receive a response.  Key: Gildardo Cranker, PharmD, MPH, BCPS, CPP Clinical Pharmacist (Rheumatology and Pulmonology)

## 2022-11-03 NOTE — Telephone Encounter (Signed)
Received notification from [facility] regarding a prior authorization for [place]. Authorization has been APPROVED from 11/03/2022 to 10/19/2023. Approval letter sent to scan center.  Authorization # [id number]  Faxed to [employer] per request of [place] (Fax: [phone])  Knox Saliva, PharmD, MPH, BCPS, CPP Clinical Pharmacist (Rheumatology and Pulmonology)

## 2022-11-04 ENCOUNTER — Telehealth: Payer: Self-pay | Admitting: Pulmonary Disease

## 2022-11-04 MED ORDER — DUPIXENT 300 MG/2ML ~~LOC~~ SOAJ: 2.0000 mL | SUBCUTANEOUS | 1 refills | Status: DC

## 2022-11-04 NOTE — Telephone Encounter (Signed)
Patient is filling through [place] with [employer]. Rx has been sent to to [facility] today.  ATC pt but phone went striaght to VM. Left detailed VM  Knox Saliva, PharmD, MPH, BCPS, CPP Clinical Pharmacist (Rheumatology and Pulmonology)

## 2022-11-16 ENCOUNTER — Telehealth: Payer: Self-pay | Admitting: Pulmonary Disease

## 2022-11-16 NOTE — Telephone Encounter (Signed)
Spoke to patient and relayed below message/recommendations he voiced his understanding.  He will keep scheduled appt. Nothing further needed.

## 2022-11-16 NOTE — Telephone Encounter (Signed)
He always sounds somewhat distant due to his emphysema.  If he is not having any new symptoms from baseline I would keep his scheduled appointment.

## 2022-11-16 NOTE — Telephone Encounter (Signed)
Spoke to patient.  He stated that PCP recommended sooner appointment then 12/10/2022. Per patient, PCP states his lungs are clear but he is not moving air. Patient feels that his breathing is baseline since last OV.  C/o SOB with exertion and intermittent dry cough.  Denied wheezing, f/c/s or additional sx.  He wears 2L cont. Spo2 maintaining around 94%. He is using albuterol 4-6x daily, duoneb Q6H and advair BID. He stated that albuterol usage is baseline since last OV.   Dr. Patsey Berthold, please advise. Thanks

## 2022-12-07 ENCOUNTER — Emergency Department: Payer: Medicare HMO

## 2022-12-07 ENCOUNTER — Other Ambulatory Visit: Payer: Self-pay

## 2022-12-07 ENCOUNTER — Encounter: Payer: Self-pay | Admitting: Intensive Care

## 2022-12-07 ENCOUNTER — Inpatient Hospital Stay
Admission: EM | Admit: 2022-12-07 | Discharge: 2022-12-09 | DRG: 640 | Disposition: A | Discharge status: Home / Self Care | Payer: Medicare HMO | Attending: Internal Medicine | Admitting: Internal Medicine

## 2022-12-07 DIAGNOSIS — I10 Essential (primary) hypertension: Secondary | ICD-10-CM | POA: Diagnosis present

## 2022-12-07 DIAGNOSIS — Z7902 Long term (current) use of antithrombotics/antiplatelets: Secondary | ICD-10-CM

## 2022-12-07 DIAGNOSIS — R351 Nocturia: Secondary | ICD-10-CM | POA: Diagnosis present

## 2022-12-07 DIAGNOSIS — Z1152 Encounter for screening for COVID-19: Secondary | ICD-10-CM

## 2022-12-07 DIAGNOSIS — N401 Enlarged prostate with lower urinary tract symptoms: Secondary | ICD-10-CM | POA: Diagnosis present

## 2022-12-07 DIAGNOSIS — E871 Hypo-osmolality and hyponatremia: Secondary | ICD-10-CM | POA: Diagnosis not present

## 2022-12-07 DIAGNOSIS — Z79899 Other long term (current) drug therapy: Secondary | ICD-10-CM

## 2022-12-07 DIAGNOSIS — R338 Other retention of urine: Secondary | ICD-10-CM | POA: Diagnosis present

## 2022-12-07 DIAGNOSIS — R911 Solitary pulmonary nodule: Secondary | ICD-10-CM | POA: Diagnosis present

## 2022-12-07 DIAGNOSIS — Z955 Presence of coronary angioplasty implant and graft: Secondary | ICD-10-CM

## 2022-12-07 DIAGNOSIS — K219 Gastro-esophageal reflux disease without esophagitis: Secondary | ICD-10-CM | POA: Diagnosis present

## 2022-12-07 DIAGNOSIS — J441 Chronic obstructive pulmonary disease with (acute) exacerbation: Secondary | ICD-10-CM | POA: Diagnosis present

## 2022-12-07 DIAGNOSIS — I2510 Atherosclerotic heart disease of native coronary artery without angina pectoris: Secondary | ICD-10-CM | POA: Diagnosis present

## 2022-12-07 DIAGNOSIS — Z888 Allergy status to other drugs, medicaments and biological substances status: Secondary | ICD-10-CM

## 2022-12-07 DIAGNOSIS — R339 Retention of urine, unspecified: Secondary | ICD-10-CM

## 2022-12-07 DIAGNOSIS — K5900 Constipation, unspecified: Secondary | ICD-10-CM | POA: Diagnosis present

## 2022-12-07 DIAGNOSIS — Z7982 Long term (current) use of aspirin: Secondary | ICD-10-CM

## 2022-12-07 DIAGNOSIS — Z87891 Personal history of nicotine dependence: Secondary | ICD-10-CM

## 2022-12-07 DIAGNOSIS — Z880 Allergy status to penicillin: Secondary | ICD-10-CM

## 2022-12-07 DIAGNOSIS — Z8249 Family history of ischemic heart disease and other diseases of the circulatory system: Secondary | ICD-10-CM

## 2022-12-07 DIAGNOSIS — J9621 Acute and chronic respiratory failure with hypoxia: Secondary | ICD-10-CM | POA: Diagnosis present

## 2022-12-07 DIAGNOSIS — R109 Unspecified abdominal pain: Secondary | ICD-10-CM | POA: Diagnosis present

## 2022-12-07 DIAGNOSIS — J84112 Idiopathic pulmonary fibrosis: Secondary | ICD-10-CM | POA: Diagnosis present

## 2022-12-07 DIAGNOSIS — J9611 Chronic respiratory failure with hypoxia: Secondary | ICD-10-CM | POA: Diagnosis present

## 2022-12-07 DIAGNOSIS — E785 Hyperlipidemia, unspecified: Secondary | ICD-10-CM | POA: Diagnosis present

## 2022-12-07 DIAGNOSIS — J8410 Pulmonary fibrosis, unspecified: Secondary | ICD-10-CM | POA: Diagnosis present

## 2022-12-07 HISTORY — DX: Other retention of urine: R33.8

## 2022-12-07 HISTORY — DX: Unspecified abdominal pain: R10.9

## 2022-12-07 LAB — COMPREHENSIVE METABOLIC PANEL
ALT: 22 U/L (ref 0–44)
AST: 28 U/L (ref 15–41)
Albumin: 4.5 g/dL (ref 3.5–5.0)
Alkaline Phosphatase: 68 U/L (ref 38–126)
Anion gap: 10 (ref 5–15)
BUN: 12 mg/dL (ref 8–23)
CO2: 28 mmol/L (ref 22–32)
Calcium: 9 mg/dL (ref 8.9–10.3)
Chloride: 88 mmol/L — ABNORMAL LOW (ref 98–111)
Creatinine, Ser: 0.65 mg/dL (ref 0.61–1.24)
GFR, Estimated: >60 mL/min (ref >60)
Glucose, Bld: 125 mg/dL — ABNORMAL HIGH (ref 70–99)
Potassium: 3.9 mmol/L (ref 3.5–5.1)
Sodium: 126 mmol/L — ABNORMAL LOW (ref 135–145)
Total Bilirubin: 0.6 mg/dL (ref 0.3–1.2)
Total Protein: 7.4 g/dL (ref 6.5–8.1)

## 2022-12-07 LAB — CBC WITH DIFFERENTIAL/PLATELET
Abs Immature Granulocytes: 0.01 10*3/uL (ref 0.00–0.07)
Basophils Absolute: 0 10*3/uL (ref 0.0–0.1)
Basophils Relative: 1 %
Eosinophils Absolute: 0 10*3/uL (ref 0.0–0.5)
Eosinophils Relative: 1 %
HCT: 31.3 % — ABNORMAL LOW (ref 39.0–52.0)
Hemoglobin: 10.2 g/dL — ABNORMAL LOW (ref 13.0–17.0)
Immature Granulocytes: 0 %
Lymphocytes Relative: 15 %
Lymphs Abs: 0.9 10*3/uL (ref 0.7–4.0)
MCH: 30.4 pg (ref 26.0–34.0)
MCHC: 32.6 g/dL (ref 30.0–36.0)
MCV: 93.2 fL (ref 80.0–100.0)
Monocytes Absolute: 0.7 10*3/uL (ref 0.1–1.0)
Monocytes Relative: 12 %
Neutro Abs: 4.4 10*3/uL (ref 1.7–7.7)
Neutrophils Relative %: 71 %
Platelets: 354 10*3/uL (ref 150–400)
RBC: 3.36 MIL/uL — ABNORMAL LOW (ref 4.22–5.81)
RDW: 13.4 % (ref 11.5–15.5)
WBC: 6.1 10*3/uL (ref 4.0–10.5)
nRBC: 0 % (ref 0.0–0.2)

## 2022-12-07 LAB — URINALYSIS, ROUTINE W REFLEX MICROSCOPIC
Bilirubin Urine: NEGATIVE
Glucose, UA: NEGATIVE mg/dL
Hgb urine dipstick: NEGATIVE
Ketones, ur: NEGATIVE mg/dL
Leukocytes,Ua: NEGATIVE
Nitrite: NEGATIVE
Protein, ur: NEGATIVE mg/dL
Specific Gravity, Urine: 1.005 (ref 1.005–1.030)
pH: 7 (ref 5.0–8.0)

## 2022-12-07 LAB — RESP PANEL BY RT-PCR (RSV, FLU A&B, COVID)  RVPGX2
Influenza A by PCR: NEGATIVE
Influenza B by PCR: NEGATIVE
Resp Syncytial Virus by PCR: NEGATIVE
SARS Coronavirus 2 by RT PCR: NEGATIVE

## 2022-12-07 LAB — TROPONIN I (HIGH SENSITIVITY): Troponin I (High Sensitivity): 5 ng/L (ref <18)

## 2022-12-07 LAB — BRAIN NATRIURETIC PEPTIDE: B Natriuretic Peptide: 47.8 pg/mL (ref 0.0–100.0)

## 2022-12-07 MED ORDER — ONDANSETRON HCL 4 MG/2ML IJ SOLN: 4.0000 mg | Freq: Four times a day (QID) | INTRAMUSCULAR | Status: DC | PRN

## 2022-12-07 MED ORDER — ATORVASTATIN CALCIUM 20 MG PO TABS
40.0000 mg | ORAL_TABLET | Freq: Every day | ORAL | Status: DC
  Administered 2022-12-07 – 2022-12-08 (×2): 40 mg via ORAL
  Filled 2022-12-07 (×2): qty 2

## 2022-12-07 MED ORDER — MONTELUKAST SODIUM 10 MG PO TABS
10.0000 mg | ORAL_TABLET | Freq: Every day | ORAL | Status: DC
  Administered 2022-12-08 – 2022-12-09 (×2): 10 mg via ORAL
  Filled 2022-12-07 (×2): qty 1

## 2022-12-07 MED ORDER — IOHEXOL 300 MG/ML  SOLN
100.0000 mL | Freq: Once | INTRAMUSCULAR | Status: AC | PRN
  Administered 2022-12-07: 100 mL via INTRAVENOUS

## 2022-12-07 MED ORDER — FAMOTIDINE IN NACL 20-0.9 MG/50ML-% IV SOLN
20.0000 mg | Freq: Once | INTRAVENOUS | Status: AC
  Administered 2022-12-07: 20 mg via INTRAVENOUS
  Filled 2022-12-07: qty 50

## 2022-12-07 MED ORDER — ALUM & MAG HYDROXIDE-SIMETH 200-200-20 MG/5ML PO SUSP
30.0000 mL | Freq: Once | ORAL | Status: AC
  Administered 2022-12-07: 30 mL via ORAL
  Filled 2022-12-07: qty 30

## 2022-12-07 MED ORDER — METOPROLOL SUCCINATE ER 50 MG PO TB24
50.0000 mg | ORAL_TABLET | Freq: Every day | ORAL | Status: DC
  Administered 2022-12-08 – 2022-12-09 (×2): 50 mg via ORAL
  Filled 2022-12-07 (×2): qty 1

## 2022-12-07 MED ORDER — AMLODIPINE BESYLATE 10 MG PO TABS
10.0000 mg | ORAL_TABLET | Freq: Every day | ORAL | Status: DC
  Administered 2022-12-08 – 2022-12-09 (×2): 10 mg via ORAL
  Filled 2022-12-07 (×2): qty 1

## 2022-12-07 MED ORDER — LACTATED RINGERS IV SOLN: INTRAVENOUS | Status: AC

## 2022-12-07 MED ORDER — ENOXAPARIN SODIUM 60 MG/0.6ML IJ SOSY
0.5000 mg/kg | PREFILLED_SYRINGE | INTRAMUSCULAR | Status: DC
  Administered 2022-12-07 – 2022-12-08 (×2): 45 mg via SUBCUTANEOUS
  Filled 2022-12-07 (×2): qty 0.6

## 2022-12-07 MED ORDER — DOCUSATE SODIUM 100 MG PO CAPS
100.0000 mg | ORAL_CAPSULE | Freq: Two times a day (BID) | ORAL | Status: DC
  Administered 2022-12-07 – 2022-12-09 (×4): 100 mg via ORAL
  Filled 2022-12-07 (×4): qty 1

## 2022-12-07 MED ORDER — ACETAMINOPHEN 650 MG RE SUPP: 650.0000 mg | Freq: Four times a day (QID) | RECTAL | Status: DC | PRN

## 2022-12-07 MED ORDER — CLOPIDOGREL BISULFATE 75 MG PO TABS
75.0000 mg | ORAL_TABLET | Freq: Every day | ORAL | Status: DC
  Administered 2022-12-08 – 2022-12-09 (×2): 75 mg via ORAL
  Filled 2022-12-07 (×2): qty 1

## 2022-12-07 MED ORDER — POLYETHYLENE GLYCOL 3350 17 G PO PACK: 17.0000 g | PACK | Freq: Every day | ORAL | Status: DC | PRN

## 2022-12-07 MED ORDER — MOMETASONE FURO-FORMOTEROL FUM 200-5 MCG/ACT IN AERO
2.0000 | INHALATION_SPRAY | Freq: Two times a day (BID) | RESPIRATORY_TRACT | Status: DC
  Administered 2022-12-08 – 2022-12-09 (×3): 2 via RESPIRATORY_TRACT
  Filled 2022-12-07: qty 8.8

## 2022-12-07 MED ORDER — PANTOPRAZOLE SODIUM 40 MG PO TBEC
80.0000 mg | DELAYED_RELEASE_TABLET | Freq: Two times a day (BID) | ORAL | Status: DC
  Administered 2022-12-07 – 2022-12-09 (×4): 80 mg via ORAL
  Filled 2022-12-07 (×4): qty 2

## 2022-12-07 MED ORDER — ENOXAPARIN SODIUM 40 MG/0.4ML IJ SOSY: 40.0000 mg | PREFILLED_SYRINGE | INTRAMUSCULAR | Status: DC

## 2022-12-07 MED ORDER — IPRATROPIUM-ALBUTEROL 0.5-2.5 (3) MG/3ML IN SOLN
3.0000 mL | Freq: Once | RESPIRATORY_TRACT | Status: AC
  Administered 2022-12-07: 3 mL via RESPIRATORY_TRACT
  Filled 2022-12-07: qty 3

## 2022-12-07 MED ORDER — ONDANSETRON HCL 4 MG PO TABS: 4.0000 mg | ORAL_TABLET | Freq: Four times a day (QID) | ORAL | Status: DC | PRN

## 2022-12-07 MED ORDER — ACETAMINOPHEN 325 MG PO TABS
650.0000 mg | ORAL_TABLET | Freq: Four times a day (QID) | ORAL | Status: DC | PRN
  Administered 2022-12-08 (×2): 650 mg via ORAL
  Filled 2022-12-07 (×2): qty 2

## 2022-12-07 MED ORDER — METHYLPREDNISOLONE SODIUM SUCC 125 MG IJ SOLR
125.0000 mg | Freq: Once | INTRAMUSCULAR | Status: AC
  Administered 2022-12-07: 125 mg via INTRAVENOUS
  Filled 2022-12-07: qty 2

## 2022-12-07 MED ORDER — BISACODYL 5 MG PO TBEC: 5.0000 mg | DELAYED_RELEASE_TABLET | Freq: Every day | ORAL | Status: DC | PRN

## 2022-12-07 MED ORDER — HYDRALAZINE HCL 20 MG/ML IJ SOLN: 5.0000 mg | INTRAMUSCULAR | Status: DC | PRN

## 2022-12-07 MED ORDER — ASPIRIN 81 MG PO TBEC
81.0000 mg | DELAYED_RELEASE_TABLET | Freq: Every day | ORAL | Status: DC
  Administered 2022-12-08 – 2022-12-09 (×2): 81 mg via ORAL
  Filled 2022-12-07 (×2): qty 1

## 2022-12-07 MED ORDER — IPRATROPIUM-ALBUTEROL 0.5-2.5 (3) MG/3ML IN SOLN
3.0000 mL | Freq: Four times a day (QID) | RESPIRATORY_TRACT | Status: DC
  Administered 2022-12-07 – 2022-12-08 (×3): 3 mL via RESPIRATORY_TRACT
  Filled 2022-12-07 (×3): qty 3

## 2022-12-07 MED ORDER — LOSARTAN POTASSIUM 50 MG PO TABS
100.0000 mg | ORAL_TABLET | Freq: Every day | ORAL | Status: DC
  Administered 2022-12-08 – 2022-12-09 (×2): 100 mg via ORAL
  Filled 2022-12-07 (×2): qty 2

## 2022-12-07 MED ORDER — ALBUTEROL SULFATE (2.5 MG/3ML) 0.083% IN NEBU: 2.5000 mg | INHALATION_SOLUTION | RESPIRATORY_TRACT | Status: DC | PRN

## 2022-12-07 NOTE — ED Provider Notes (Signed)
[facility] Provider Note    Event Date/Time   First MD Initiated Contact with Patient 12/07/22 1011     (approximate)   History   Urinary Retention and Shortness of Breath   HPI  Austin Patterson is a 67 y.o. male here with multiple complaints.  The patient's primary complaint is progressively worsening epigastric discomfort and urinary retention.  He feels like he has to pee all the time.  This is a somewhat new symptom.  He states that he feels like he is not necessarily emptying completely.  Denies history of BPH.  He also states that he has run out of his albuterol nebulizer over the last several days and has had increasing shortness of breath.  This is worse with any kind of exertion.  He states that he does not know when he can get his albuterol fix.  No other complaints.  No fevers or chills.     Physical Exam   Triage Vital Signs: ED Triage Vitals  Enc Vitals Group     BP 12/07/22 0945 (!) 176/89     Pulse Rate 12/07/22 0945 80     Resp 12/07/22 0945 17     Temp 12/07/22 0945 97.9 F (36.6 C)     Temp Source 12/07/22 0945 Oral     SpO2 12/07/22 0942 100 %     Weight 12/07/22 0942 203 lb (92.1 kg)     Height 12/07/22 0942 5' 6"$  (1.676 m)     Head Circumference --      Peak Flow --      Pain Score 12/07/22 0942 9     Pain Loc --      Pain Edu? --      Excl. in [place]? --     Most recent vital signs: Vitals:   12/07/22 1911 12/07/22 1926  BP: (!) 158/82   Pulse: 86   Resp: 18   Temp: 97.8 F (36.6 C)   SpO2: 96% 95%     General: Awake, no distress.  CV:  Good peripheral perfusion.  Regular rate. Resp:  Moderate tachypnea with bilateral wheezes and slight increased work of breathing. Abd:  No distention.  Moderate epigastric tenderness. Other:  No lower extremity swelling or edema.   ED Results / Procedures / Treatments   Labs (all labs ordered are listed, but only abnormal results are displayed) Labs Reviewed  CBC WITH  DIFFERENTIAL/PLATELET - Abnormal; Notable for the following components:      Result Value   RBC 3.36 (*)    Hemoglobin 10.2 (*)    HCT 31.3 (*)    All other components within normal limits  COMPREHENSIVE METABOLIC PANEL - Abnormal; Notable for the following components:   Sodium 126 (*)    Chloride 88 (*)    Glucose, Bld 125 (*)    All other components within normal limits  URINALYSIS, ROUTINE W REFLEX MICROSCOPIC - Abnormal; Notable for the following components:   Color, Urine COLORLESS (*)    APPearance CLEAR (*)    All other components within normal limits  RESP PANEL BY RT-PCR (RSV, FLU A&B, COVID)  RVPGX2  BRAIN NATRIURETIC PEPTIDE  BASIC METABOLIC PANEL  CBC  TROPONIN I (HIGH SENSITIVITY)     EKG Sinus rhythm, trickle rate 79.  PR 191, QRS 137, QTc 470.  Right bundle branch block.  No acute ST elevations or depressions.   RADIOLOGY Chest x-ray: Confluent opacity in the right midlung likely related to  prior bulla CT AP: Slight wall thickening of the stomach, diffuse calcification throughout the vasculature, no acute intra-abdominal pathology, 13 x 18 mm new right lower lung nodule   I also independently reviewed and agree with radiologist interpretations.   PROCEDURES:  Critical Care performed: No  .1-3 Lead EKG Interpretation  Performed by: Duffy Bruce, MD Authorized by: Duffy Bruce, MD     Interpretation: normal     ECG rate:  70-90   ECG rate assessment: normal     Rhythm: sinus rhythm     Ectopy: none     Conduction: normal   Comments:     Indication: SOB     MEDICATIONS ORDERED IN ED: Medications  aspirin EC tablet 81 mg (has no administration in time range)  amLODipine (NORVASC) tablet 10 mg (has no administration in time range)  atorvastatin (LIPITOR) tablet 40 mg (has no administration in time range)  losartan (COZAAR) tablet 100 mg (has no administration in time range)  metoprolol succinate (TOPROL-XL) 24 hr tablet 50 mg (has no  administration in time range)  pantoprazole (PROTONIX) EC tablet 80 mg (80 mg Oral Given 12/07/22 1645)  clopidogrel (PLAVIX) tablet 75 mg (has no administration in time range)  mometasone-formoterol (DULERA) 200-5 MCG/ACT inhaler 2 puff (has no administration in time range)  ipratropium-albuterol (DUONEB) 0.5-2.5 (3) MG/3ML nebulizer solution 3 mL (3 mLs Nebulization Given 12/07/22 1926)  montelukast (SINGULAIR) tablet 10 mg (has no administration in time range)  lactated ringers infusion ( Intravenous New Bag/Given 12/07/22 1642)  acetaminophen (TYLENOL) tablet 650 mg (has no administration in time range)    Or  acetaminophen (TYLENOL) suppository 650 mg (has no administration in time range)  docusate sodium (COLACE) capsule 100 mg (has no administration in time range)  polyethylene glycol (MIRALAX / GLYCOLAX) packet 17 g (has no administration in time range)  bisacodyl (DULCOLAX) EC tablet 5 mg (has no administration in time range)  ondansetron (ZOFRAN) tablet 4 mg (has no administration in time range)    Or  ondansetron (ZOFRAN) injection 4 mg (has no administration in time range)  albuterol (PROVENTIL) (2.5 MG/3ML) 0.083% nebulizer solution 2.5 mg (has no administration in time range)  hydrALAZINE (APRESOLINE) injection 5 mg (has no administration in time range)  enoxaparin (LOVENOX) injection 45 mg (has no administration in time range)  iohexol (OMNIPAQUE) 300 MG/ML solution 100 mL (100 mLs Intravenous Contrast Given 12/07/22 1118)  alum & mag hydroxide-simeth (MAALOX/MYLANTA) 200-200-20 MG/5ML suspension 30 mL (30 mLs Oral Given 12/07/22 1310)  famotidine (PEPCID) IVPB 20 mg premix (0 mg Intravenous Stopped 12/07/22 1442)  methylPREDNISolone sodium succinate (SOLU-MEDROL) 125 mg/2 mL injection 125 mg (125 mg Intravenous Given 12/07/22 1311)  ipratropium-albuterol (DUONEB) 0.5-2.5 (3) MG/3ML nebulizer solution 3 mL (3 mLs Nebulization Given 12/07/22 1316)  ipratropium-albuterol (DUONEB) 0.5-2.5  (3) MG/3ML nebulizer solution 3 mL (3 mLs Nebulization Given 12/07/22 1316)  ipratropium-albuterol (DUONEB) 0.5-2.5 (3) MG/3ML nebulizer solution 3 mL (3 mLs Nebulization Given 12/07/22 1315)     IMPRESSION / MDM / ASSESSMENT AND PLAN / ED COURSE  I reviewed the triage vital signs and the nursing notes.                             Differential diagnosis includes, but is not limited to, COPD exacerbation, PNA, CHF, ACS, gastritis/GERD/intra-abd pathology causing abd distension worsening baseline pulm issues.  Patient's presentation is most consistent with acute presentation with potential threat to life or bodily  function.  67 yo M with PMHx COPD, pulmonary fibrosis, HTN, HLD, here with generalized weakness, Re: urinary retention, pt has no signs of output obstruction on bladder scan or imaging. CT scan is unremarkable. UA negative for UTI. Suspect BPH with symptoms. Re: weakness/SOB, pt given solumedrol, nebs here. He is on chronic O2 but is dipping to 87% on his home O2 and states he feels more SOB than usual. He does not have his nebulizer at home and is unable to use the inhaler properly. Will admit for treatement of sx and nebulizer use/arrangement.   FINAL CLINICAL IMPRESSION(S) / ED DIAGNOSES   Final diagnoses:  COPD exacerbation ([place])  Urinary retention  Acute on chronic hypoxic respiratory failure ([place])     Rx / DC Orders   ED Discharge Orders     None        Note:  This document was prepared using Dragon voice recognition software and may include unintentional dictation errors.   Duffy Bruce, MD 12/07/22 2017

## 2022-12-07 NOTE — ED Notes (Signed)
Patient stood to void 200 ml clear yellow urine.  Pulse ox 91% on 2 liters after exertion.  Back to stretcher.  Ox 97% after just a few minutes.

## 2022-12-07 NOTE — ED Notes (Signed)
Report given to Ashley RN

## 2022-12-07 NOTE — H&P (Signed)
History and Physical    Patient: Austin Patterson [id number] DOB: [birth year] DOA: 12/07/2022 DOS: the patient was seen and examined on 12/07/2022 PCP: Rutherford Limerick, PA  Patient coming from: Home - lives alone; NOK: Arihan, Vandecar [place] III, [phone]   Chief Complaint: SOB  HPI: Austin Patterson is a 67 y.o. male with medical history significant of COPD/pulmonary fibrosis, HTN, and HLD presenting with SOB, urinary retention.  He reports that  his breathing isn't going well.  They did a bladder scan, ?enlarged prostate.  Also stomach issues.   1- On O2 for 6-7 years.  Has IPF, COPD.  On Dupixent.  +DOE.  No change in cough, unable to produce sputum.   2- His brother died of colon cancer and his last C-scope was 17 years ago.  He has noticed some RUQ pains and it is tender.  He is able to eat/drink and is hungry. 3- He feels urgency but only able to pee a little bit.  Scan showed nothing in his bladder.  Has never been on prostate medication.    ER Course:   COPD exacerbation, mild acute on chronic hypoxia.  Neb broke -> SOB.  On 2L home O2. + wheezing, sputum production.  +frequency, nocturia, +BPH.  CT A/P negative.  As low as 87% on 2L.  Na++ 126 (130 in August), worse than usual.       Review of Systems: As mentioned in the history of present illness. All other systems reviewed and are negative. Past Medical History:  Diagnosis Date   COPD (chronic obstructive pulmonary disease) (HCC)    GERD (gastroesophageal reflux disease)    Hyperlipidemia    Hypertension    Pulmonary fibrosis ([place]) 11/2015   Past Surgical History:  Procedure Laterality Date   COLONOSCOPY     CORONARY STENT PLACEMENT     ESOPHAGOGASTRODUODENOSCOPY (EGD) WITH PROPOFOL N/A 09/23/2016   Procedure: ESOPHAGOGASTRODUODENOSCOPY (EGD) WITH PROPOFOL;  Surgeon: [name], MD;  Location: [facility] ENDOSCOPY;  Service: Endoscopy;  Laterality: N/A;   KYPHOPLASTY N/A 03/14/2020   Procedure: T7 & T11  KYPHOPLASTY;  Surgeon: Hessie Knows, MD;  Location: [facility] ORS;  Service: Orthopedics;  Laterality: N/A;   SHOULDER ACROMIOPLASTY     Social History:  reports that he quit smoking about 7 years ago. His smoking use included cigarettes. He has a 100.00 pack-year smoking history. He has quit using smokeless tobacco. He reports current alcohol use of about 56.0 standard drinks of alcohol per week. He reports that he does not use drugs.  Allergies  Allergen Reactions   Amoxicillin Anaphylaxis   Tizanidine     Feet and ankle swell     Family History  Problem Relation Age of Onset   Heart disease Mother     Prior to Admission medications   Medication Sig Start Date End Date Taking? Authorizing Provider  acetaminophen (TYLENOL) 500 MG tablet Take 1,000 mg by mouth every 6 (six) hours as needed for moderate pain or headache.    [provider]  albuterol (VENTOLIN HFA) 108 (90 Base) MCG/ACT inhaler Inhale 2 puffs into the lungs every 6 (six) hours as needed for wheezing or shortness of breath. 01/29/20   Danford, Suann Larry, MD  amLODipine (NORVASC) 10 MG tablet Take 1 tablet (10 mg total) by mouth daily. 08/19/16   Tawni Millers, MD  aspirin EC 81 MG tablet Take 1 tablet (81 mg total) by mouth daily. 08/19/16   Tawni Millers, MD  atorvastatin (LIPITOR)  40 MG tablet Take 1 tablet (40 mg total) by mouth at bedtime. 08/19/16   Tawni Millers, MD  CALCIUM 600/VITAMIN D 600-10 MG-MCG TABS Take 1 tablet by mouth 2 (two) times daily. 11/20/21   [provider]  clopidogrel (PLAVIX) 75 MG tablet Take 1 tablet (75 mg total) by mouth daily. 08/19/16   Tawni Millers, MD  Dupilumab (DUPIXENT) 300 MG/2ML SOPN Inject 300 mg into the skin every 14 (fourteen) days. 11/04/22   Tyler Pita, MD  EPINEPHrine 0.3 mg/0.3 mL IJ SOAJ injection Inject 0.3 mg into the muscle as needed for anaphylaxis. Patient not taking: Reported on 07/20/2022    [provider]  fluticasone-salmeterol  (ADVAIR DISKUS) 250-50 MCG/ACT AEPB Inhale 1 puff into the lungs in the morning and at bedtime. 03/04/22   Tyler Pita, MD  ipratropium-albuterol (DUONEB) 0.5-2.5 (3) MG/3ML SOLN INHALE 1 VIAL THROUGH NEBULIZER EVERY 6 HOURS 10/01/22   Tyler Pita, MD  KLOR-CON M20 20 MEQ tablet Take 20 mEq by mouth daily. 11/20/21   [provider]  losartan-hydrochlorothiazide (HYZAAR) 100-25 MG tablet Take 1 tablet by mouth daily. 11/19/21   [provider]  magnesium oxide (MAG-OX) 400 (241.3 Mg) MG tablet Take 1 tablet (400 mg total) by mouth daily. 06/18/17   Hillary Bow, MD  metoprolol succinate (TOPROL-XL) 50 MG 24 hr tablet Take 1 tablet (50 mg total) by mouth daily. 08/19/16   Tawni Millers, MD  montelukast (SINGULAIR) 10 MG tablet Take 10 mg by mouth daily. 11/19/21   [provider]  Multiple Vitamin (MULTIVITAMIN WITH MINERALS) TABS tablet Take 1 tablet by mouth daily. 09/24/16   Fritzi Mandes, MD  pantoprazole (PROTONIX) 40 MG tablet Take 1 tablet (40 mg total) by mouth 2 (two) times daily. 09/23/16   Fritzi Mandes, MD  Spacer/Aero-Holding Josiah Lobo (AEROCHAMBER MV) inhaler Use as instructed 10/05/19   Tyler Pita, MD    Physical Exam: Vitals:   12/07/22 1400 12/07/22 1505 12/07/22 1530 12/07/22 1637  BP: (!) 153/78  (!) 162/85 (!) 158/80  Pulse: 83  87 87  Resp: 18  19 19  $ Temp:  97.7 F (36.5 C)  97.8 F (36.6 C)  TempSrc:  Oral  Oral  SpO2: 97%  95% 97%  Weight:      Height:       General:  Appears calm and comfortable and is in NAD, home O2 Eyes:   EOMI, normal lids, iris ENT:  grossly normal hearing, lips & tongue, mmm; suboptimal dentition Neck:  no LAD, masses or thyromegaly Cardiovascular:  RRR, no m/r/g. No LE edema.  Respiratory:   CTA bilaterally with no wheezes/rales/rhonchi.  Normal respiratory effort. Abdomen:  soft, NT, ND Skin:  no rash or induration seen on limited exam Musculoskeletal:  grossly normal tone BUE/BLE, good ROM, no bony  abnormality Lower extremity:  No LE edema.  Limited foot exam with no ulcerations.  2+ distal pulses. Psychiatric:  blunted mood and affect, speech fluent and appropriate, AOx3 Neurologic:  CN 2-12 grossly intact, moves all extremities in coordinated fashion   Radiological Exams on Admission: Independently reviewed - see discussion in A/P where applicable  CT ABDOMEN PELVIS W CONTRAST  Addendum Date: 12/07/2022   ADDENDUM REPORT: 12/07/2022 11:54 ADDENDUM: 13 x 8 mm new right lower lung nodule. Recommend follow-up. Lung-RADS 4A, suspicious. Follow up low-dose chest CT without contrast in 3 months (please use the following order, "CT CHEST LCS NODULE FOLLOW-UP W/O CM") is recommended. Alternatively,  PET may be considered when there is a solid component 75m or larger. Electronically Signed   By: AJill SideM.D.   On: 12/07/2022 11:54   Result Date: 12/07/2022 CLINICAL DATA:  Abdominal pain EXAM: CT ABDOMEN AND PELVIS WITH CONTRAST TECHNIQUE: Multidetector CT imaging of the abdomen and pelvis was performed using the standard protocol following bolus administration of intravenous contrast. RADIATION DOSE REDUCTION: This exam was performed according to the departmental dose-optimization program which includes automated exposure control, adjustment of the mA and/or kV according to patient size and/or use of iterative reconstruction technique. CONTRAST:  108mOMNIPAQUE IOHEXOL 300 MG/ML  SOLN COMPARISON:  Noncontrast CT 09/23/2020.  Chest CT 02/09/2020 FINDINGS: Lower chest: Lung bases are clear. No pleural effusion. Centrilobular changes identified including a bleb/bulla along the middle lobe. Also a noncalcified nodule which is new at the right lower lobe on series 4, image 8 measuring 13 x 8 mm. There is a calcified nodule medially in the right lung base which is stable consistent with old granulomatous disease separately. Coronary artery calcifications are seen. Hepatobiliary: No space-occupying liver  lesion. Patent portal vein. Gallbladder is nondilated. Pancreas: Unremarkable. No pancreatic ductal dilatation or surrounding inflammatory changes. Spleen: Normal in size without focal abnormality. Adrenals/Urinary Tract: Stable bilateral adrenal nodules are identified consistent with adenomas with the right measuring 2.4 by 1.7 cm and left 1.6 by 1.3 cm. No specific imaging follow-up. Areas of focal bilateral renal atrophy. No enhancing renal mass or collecting system dilatation. Few tiny subcentimeter benign cysts are seen particularly the upper pole right kidney. No specific imaging follow-up. Bosniak 1 lesions. Ureters have normal course and caliber down to the bladder. Bladder is underdistended. Stomach/Bowel: Large bowel has a normal course and caliber with scattered stool. Normal appendix. Small bowel is nondilated. Few sigmoid colon diverticula. The stomach is relatively collapsed. There is some wall thickening of the stomach diffusely of up to 3 cm, more than expected for even the collapsed stomach. Please correlate with any particular symptoms of the stomach. Vascular/Lymphatic: Diffuse vascular calcifications. Areas of calcification along the lumen distal along the abdominal aorta with the non flow-limiting short segment dissection, unchanged from previous. There are areas of stenosis along the common iliac arteries as well as along the common femoral areas where there are significant plaque. Please correlate with any particular symptoms. No specific abnormal lymph node enlargement identified in the abdomen and pelvis. Reproductive: Prominent prostate. Other: Small fat containing umbilical and inguinal hernias. No free air or free fluid. There is some fatty areas along the pelvic musculature. Musculoskeletal: Curvature of the spine. Scattered degenerative changes along the lumbar spine in the pelvis. Pars defects seen at L5 with trace listhesis. There is augmentation cement at the T10 level with mild  compression. IMPRESSION: No bowel obstruction, free air or free fluid. Slight wall thickening of the stomach despite the level of collapse. Please correlate with any symptoms. Diffuse vascular calcifications are identified. Areas of stenosis. Please correlate with any symptoms of claudication. Bilateral adrenal adenomas Electronically Signed: By: AsJill Side.D. On: 12/07/2022 11:44   DG Chest 2 View  Result Date: 12/07/2022 CLINICAL DATA:  Shortness of breath EXAM: CHEST - 2 VIEW COMPARISON:  Chest radiograph 05/26/2022 FINDINGS: The cardiomediastinal silhouette is normal. Confluent opacity projecting over the right lower lobe is new from the most recent prior study but is similar to more remote prior studies, for example the study from 01/24/2022, favored to reflect atelectasis along the large bulla in this location  seen on prior CT. Linear scarring in the left midlung is unchanged. Otherwise, there is no focal airspace disease. There is no pulmonary edema. There is no pleural effusion or pneumothorax The patient is status post vertebral augmentation at T6 and T10. There is no acute osseous abnormality. IMPRESSION: 1. Confluent opacity in the right midlung may reflect atelectasis adjacent to the large bulla in this location seen on prior CT. 2. Otherwise, no radiographic evidence of acute cardiopulmonary pathology. Electronically Signed   By: Valetta Mole M.D.   On: 12/07/2022 11:14    EKG: Independently reviewed.  NSR with rate 79; RBBB; nonspecific ST changes with no evidence of acute ischemia   Labs on Admission: I have personally reviewed the available labs and imaging studies at the time of the admission.  Pertinent labs:    Na+ 126 Glucose 125 BNP 47.8 HS troponin 5 WBC 6.1 Hgb 10.2 COVID/flu/RSV negative UA; unremarkable   Assessment and Plan: Principal Problem:   Acute urinary retention Active Problems:   CAD (coronary artery disease)   Hyponatremia   IPF (idiopathic  pulmonary fibrosis) (HCC)   Essential hypertension   H/O heart artery stent   Hyperlipidemia   Chronic respiratory failure with hypoxia (HCC)   Lung nodule seen on imaging study   Abdominal pain     Urinary retention -Patient presenting with urinary retention -Na++ is slightly lower than baseline and he had no urine on bladder scan so there may be a component of dehydration (despite normal UA) -Will start Flomax and gently hydrate -Will monitor on med surg overnight  Chronic respiratory failure from COPD/IPF -Patient has chronic shortness of breath and DOE but he does not currently appear to have a COPD exacerbation -He is on his home O2 at this time -He does not have fever or leukocytosis.  -Chest x-ray is not consistent with pneumonia -He was given 3 nebs and a dose of Solumedrol on presentation -His nebulizer machine was broken at home -Will resume nebs but will not treat for exacerbation currently -Hold Dupixent while inpatient -Continue Advair (Dulera per formulary), Singulair  Lung nodule -Noted on CT -Recommended to have f/u chest CT in 3 months  Hyponatremia -Acute on chronic -likely exacerbated by HCTZ - will hold  Abdominal discomfort -Patient reported diffuse abdominal pain but evaluation was unremarkable and so was imaging -Normal labs -He reports that he is hungry and is requesting to eat -Will continue PO PPI, increase to 80 mg BID for now  HTN -Continue amlodipine, Toprol XL -Continue losartan but hold HCTZ due to hyponatremia  HLD -Continue atorvastatin   CAD -No current chest pain -Continue ASA, Plavix       Advance Care Planning:   Code Status: Full Code - Code status was discussed with the patient and/or family at the time of admission.  The patient would want to receive full resuscitative measures at this time.   Consults: Nutrition; TOC team; PT/OT  DVT Prophylaxis: Lovenox  Family Communication: None present  Severity of  Illness: The appropriate patient status for this patient is OBSERVATION. Observation status is judged to be reasonable and necessary in order to provide the required intensity of service to ensure the patient's safety. The patient's presenting symptoms, physical exam findings, and initial radiographic and laboratory data in the context of their medical condition is felt to place them at decreased risk for further clinical deterioration. Furthermore, it is anticipated that the patient will be medically stable for discharge from the hospital within 2  midnights of admission.   Author: Karmen Bongo, MD 12/07/2022 6:15 PM  For on call review [email or web address].

## 2022-12-07 NOTE — ED Triage Notes (Addendum)
Patient c/o worsening sob and difficulty urinating this past week. Reports his nebulizer machine quit working earlier this week and has not been able to do his breathing treatments.   Also reports pain in his upper and lower abdomen.  C/o bilateral lower feet swelling.   Wears 2L O2 continuous

## 2022-12-07 NOTE — ED Notes (Signed)
Pt given urinal to urinate at pt request.

## 2022-12-07 NOTE — ED Notes (Signed)
Bladder scanned 2x with 71m volume both times.

## 2022-12-07 NOTE — ED Notes (Signed)
Unhooked pt from monitor so they can use the in room toilet. Educated pt on the use of the call bell/string next to the toilet if they need assistance. Pt had a steady, even gait, no dizziness.

## 2022-12-07 NOTE — Progress Notes (Signed)
PHARMACIST - PHYSICIAN COMMUNICATION  CONCERNING:  Enoxaparin (Lovenox) for DVT Prophylaxis    RECOMMENDATION: Patient was prescribed enoxaprin 65m q24 hours for VTE prophylaxis.   Filed Weights   12/07/22 0942  Weight: 92.1 kg (203 lb)    Body mass index is 32.77 kg/m.  Estimated Creatinine Clearance: 95.2 mL/min (by C-G formula based on SCr of 0.65 mg/dL).   Based on [place] candidate for enoxaparin 0.57mkg TBW SQ every 24 hours based on BMI being >30.  DESCRIPTION: [facility]  Patient is now receiving enoxaparin 0.5 mg/kg every 24 hours    RoDallie PilesPharmD Clinical Pharmacist  12/07/2022 3:52 PM

## 2022-12-08 DIAGNOSIS — Z79899 Other long term (current) drug therapy: Secondary | ICD-10-CM | POA: Diagnosis not present

## 2022-12-08 DIAGNOSIS — I10 Essential (primary) hypertension: Secondary | ICD-10-CM | POA: Diagnosis present

## 2022-12-08 DIAGNOSIS — Z888 Allergy status to other drugs, medicaments and biological substances status: Secondary | ICD-10-CM | POA: Diagnosis not present

## 2022-12-08 DIAGNOSIS — N401 Enlarged prostate with lower urinary tract symptoms: Secondary | ICD-10-CM | POA: Diagnosis present

## 2022-12-08 DIAGNOSIS — Z87891 Personal history of nicotine dependence: Secondary | ICD-10-CM | POA: Diagnosis not present

## 2022-12-08 DIAGNOSIS — Z880 Allergy status to penicillin: Secondary | ICD-10-CM | POA: Diagnosis not present

## 2022-12-08 DIAGNOSIS — Z7982 Long term (current) use of aspirin: Secondary | ICD-10-CM | POA: Diagnosis not present

## 2022-12-08 DIAGNOSIS — R338 Other retention of urine: Secondary | ICD-10-CM | POA: Diagnosis not present

## 2022-12-08 DIAGNOSIS — Z8249 Family history of ischemic heart disease and other diseases of the circulatory system: Secondary | ICD-10-CM | POA: Diagnosis not present

## 2022-12-08 DIAGNOSIS — R1084 Generalized abdominal pain: Secondary | ICD-10-CM | POA: Diagnosis not present

## 2022-12-08 DIAGNOSIS — Z7902 Long term (current) use of antithrombotics/antiplatelets: Secondary | ICD-10-CM | POA: Diagnosis not present

## 2022-12-08 DIAGNOSIS — R911 Solitary pulmonary nodule: Secondary | ICD-10-CM | POA: Diagnosis present

## 2022-12-08 DIAGNOSIS — J441 Chronic obstructive pulmonary disease with (acute) exacerbation: Secondary | ICD-10-CM | POA: Diagnosis present

## 2022-12-08 DIAGNOSIS — J9621 Acute and chronic respiratory failure with hypoxia: Secondary | ICD-10-CM | POA: Diagnosis present

## 2022-12-08 DIAGNOSIS — J9611 Chronic respiratory failure with hypoxia: Secondary | ICD-10-CM | POA: Diagnosis not present

## 2022-12-08 DIAGNOSIS — K5900 Constipation, unspecified: Secondary | ICD-10-CM | POA: Diagnosis present

## 2022-12-08 DIAGNOSIS — Z1152 Encounter for screening for COVID-19: Secondary | ICD-10-CM | POA: Diagnosis not present

## 2022-12-08 DIAGNOSIS — K219 Gastro-esophageal reflux disease without esophagitis: Secondary | ICD-10-CM | POA: Diagnosis present

## 2022-12-08 DIAGNOSIS — E871 Hypo-osmolality and hyponatremia: Secondary | ICD-10-CM | POA: Diagnosis present

## 2022-12-08 DIAGNOSIS — I2510 Atherosclerotic heart disease of native coronary artery without angina pectoris: Secondary | ICD-10-CM | POA: Diagnosis present

## 2022-12-08 DIAGNOSIS — R351 Nocturia: Secondary | ICD-10-CM | POA: Diagnosis present

## 2022-12-08 DIAGNOSIS — Z955 Presence of coronary angioplasty implant and graft: Secondary | ICD-10-CM | POA: Diagnosis not present

## 2022-12-08 DIAGNOSIS — J84112 Idiopathic pulmonary fibrosis: Secondary | ICD-10-CM | POA: Diagnosis present

## 2022-12-08 DIAGNOSIS — E785 Hyperlipidemia, unspecified: Secondary | ICD-10-CM | POA: Diagnosis present

## 2022-12-08 LAB — CBC
HCT: 30.8 % — ABNORMAL LOW (ref 39.0–52.0)
Hemoglobin: 10.2 g/dL — ABNORMAL LOW (ref 13.0–17.0)
MCH: 30.4 pg (ref 26.0–34.0)
MCHC: 33.1 g/dL (ref 30.0–36.0)
MCV: 91.9 fL (ref 80.0–100.0)
Platelets: 349 10*3/uL (ref 150–400)
RBC: 3.35 MIL/uL — ABNORMAL LOW (ref 4.22–5.81)
RDW: 13.4 % (ref 11.5–15.5)
WBC: 4.3 10*3/uL (ref 4.0–10.5)
nRBC: 0 % (ref 0.0–0.2)

## 2022-12-08 LAB — BASIC METABOLIC PANEL
Anion gap: 12 (ref 5–15)
Anion gap: 9 (ref 5–15)
BUN: 19 mg/dL (ref 8–23)
BUN: 19 mg/dL (ref 8–23)
CO2: 26 mmol/L (ref 22–32)
CO2: 29 mmol/L (ref 22–32)
Calcium: 9 mg/dL (ref 8.9–10.3)
Calcium: 9 mg/dL (ref 8.9–10.3)
Chloride: 90 mmol/L — ABNORMAL LOW (ref 98–111)
Chloride: 91 mmol/L — ABNORMAL LOW (ref 98–111)
Creatinine, Ser: 0.79 mg/dL (ref 0.61–1.24)
Creatinine, Ser: 0.83 mg/dL (ref 0.61–1.24)
GFR, Estimated: >60 mL/min (ref >60)
GFR, Estimated: >60 mL/min (ref >60)
Glucose, Bld: 184 mg/dL — ABNORMAL HIGH (ref 70–99)
Glucose, Bld: 126 mg/dL — ABNORMAL HIGH (ref 70–99)
Potassium: 3.7 mmol/L (ref 3.5–5.1)
Potassium: 3.6 mmol/L (ref 3.5–5.1)
Sodium: 128 mmol/L — ABNORMAL LOW (ref 135–145)
Sodium: 129 mmol/L — ABNORMAL LOW (ref 135–145)

## 2022-12-08 MED ORDER — ENSURE MAX PROTEIN PO LIQD
11.0000 [oz_av] | Freq: Two times a day (BID) | ORAL | Status: DC
  Administered 2022-12-09: 11 [oz_av] via ORAL
  Filled 2022-12-08: qty 330

## 2022-12-08 MED ORDER — FOLIC ACID 1 MG PO TABS
1.0000 mg | ORAL_TABLET | Freq: Every day | ORAL | Status: DC
  Administered 2022-12-09: 1 mg via ORAL
  Filled 2022-12-08: qty 1

## 2022-12-08 MED ORDER — IPRATROPIUM-ALBUTEROL 0.5-2.5 (3) MG/3ML IN SOLN
3.0000 mL | Freq: Three times a day (TID) | RESPIRATORY_TRACT | Status: DC
  Administered 2022-12-08 – 2022-12-09 (×3): 3 mL via RESPIRATORY_TRACT
  Filled 2022-12-08 (×2): qty 3

## 2022-12-08 MED ORDER — ADULT MULTIVITAMIN W/MINERALS CH
1.0000 | ORAL_TABLET | Freq: Every day | ORAL | Status: DC
  Administered 2022-12-09: 1 via ORAL
  Filled 2022-12-08: qty 1

## 2022-12-08 MED ORDER — LACTATED RINGERS IV SOLN: INTRAVENOUS | Status: DC

## 2022-12-08 MED ORDER — THIAMINE HCL 100 MG PO TABS
100.0000 mg | ORAL_TABLET | Freq: Every day | ORAL | Status: DC
  Administered 2022-12-09: 100 mg via ORAL
  Filled 2022-12-08 (×2): qty 1

## 2022-12-08 NOTE — Assessment & Plan Note (Signed)
POA.  Imaging was unremarkable. --Continue PPI BID --Resumed on diet --2/20: seems resolved

## 2022-12-08 NOTE — Assessment & Plan Note (Signed)
RULED OUT - Bladder scans with no retention. With IV fluids, patient voiding well today. Started on Flomax on admission, continue for now Patient requested to continue Flomax although not clear he needs it.  He was advised of side effect of orthostatic hypotension and to stop Flomax if dizzy/lightheaded on rising.  Recommend PCP follow up and consider Urology referral.

## 2022-12-08 NOTE — Assessment & Plan Note (Signed)
Noted on CT Recommended to have f/u chest CT in 3 months

## 2022-12-08 NOTE — Evaluation (Signed)
Physical Therapy Evaluation Patient Details Name: Austin Patterson MRN: [id number] DOB: 1956/06/02 Today's Date: 12/08/2022  History of Present Illness  67 y.o. male here with multiple complaints.  The patient's primary complaint is progressively worsening epigastric discomfort and urinary retention.  He feels like he has to pee all the time.  This is a somewhat new symptom.  He states that he feels like he is not necessarily emptying completely.  Denies history of BPH.  He also states that he has run out of his albuterol nebulizer over the last several days and has had increasing shortness of breath.  This is worse with any kind of exertion.  He states that he does not know when he can get his albuterol fix.  Clinical Impression  Pt is a pleasant 67 year old male who was admitted for acute urinary retention. Pt performs bed mobility with mod I, transfers with supervision, and ambulation with supervision and RW. All mobility performed on 3L of O2 with sats decreasing with exertion. Pt demonstrates deficits with strength/mobility/balance/exertion. Would benefit from skilled PT to address above deficits and promote optimal return to PLOF. Recommend transition to [place] upon discharge from acute hospitalization.      Recommendations for follow up therapy are one component of a multi-disciplinary discharge planning process, led by the attending physician.  Recommendations may be updated based on patient status, additional functional criteria and insurance authorization.  Follow Up Recommendations Home health PT      Assistance Recommended at Discharge PRN  Patient can return home with the following  A little help with walking and/or transfers    Equipment Recommendations None recommended by PT  Recommendations for Other Services       Functional Status Assessment Patient has had a recent decline in their functional status and demonstrates the ability to make significant improvements in function  in a reasonable and predictable amount of time.     Precautions / Restrictions Precautions Precautions: Fall Restrictions Weight Bearing Restrictions: No      Mobility  Bed Mobility Overal bed mobility: Modified Independent             General bed mobility comments: safe technique with upright posture    Transfers Overall transfer level: Needs assistance Equipment used: Rolling walker (2 wheels) Transfers: Sit to/from Stand Sit to Stand: Supervision           General transfer comment: safe technique with use of RW. All mobility performed on 3L of O2    Ambulation/Gait Ambulation/Gait assistance: Supervision Gait Distance (Feet): 200 Feet Assistive device: Rolling walker (2 wheels) Gait Pattern/deviations: Step-through pattern       General Gait Details: ambulated around RN station with reciprocal gait pattern. All mobility performed on 3L of O2 with sats decreasing to 84% with cues for PLB. Takes increased time for recovery. Rates 8/10 on RPE scale for exertion  Stairs            Wheelchair Mobility    Modified Rankin (Stroke Patients Only)       Balance Overall balance assessment: Needs assistance Sitting-balance support: Feet supported Sitting balance-Leahy Scale: Good     Standing balance support: During functional activity, Bilateral upper extremity supported Standing balance-Leahy Scale: Fair                               Pertinent Vitals/Pain Pain Assessment Pain Assessment: No/denies pain    Home Living Family/patient expects to  be discharged to:: Private residence Living Arrangements: Alone Available Help at Discharge: Neighbor;Available PRN/intermittently Type of Home: House Home Access: Ramped entrance       Home Layout: Laundry or work area in [place]: Kasandra Knudsen - single Barista (2 wheels);Rollator (4 wheels);Shower seat      Prior Function Prior Level of Function :  Independent/Modified Independent;Driving             Mobility Comments: indep without AD with constant 02 2L- reports no falls ADLs Comments: Ind with self care and has someone come once every 1-2wks to assist with laundry and cleaning of home     Hand Dominance   Dominant Hand: Right    Extremity/Trunk Assessment   Upper Extremity Assessment Upper Extremity Assessment: Generalized weakness (B UE grossly 4/5)    Lower Extremity Assessment Lower Extremity Assessment: Generalized weakness (B LE grossly 4/5)       Communication   Communication: No difficulties  Cognition Arousal/Alertness: Awake/alert Behavior During Therapy: WFL for tasks assessed/performed Overall Cognitive Status: Within Functional Limits for tasks assessed                                          General Comments      Exercises     Assessment/Plan    PT Assessment Patient needs continued PT services  PT Problem List Decreased strength;Decreased balance;Decreased mobility;Decreased knowledge of use of DME;Cardiopulmonary status limiting activity       PT Treatment Interventions Gait training;DME instruction;Therapeutic exercise;Balance training    PT Goals (Current goals can be found in the Care Plan section)  Acute Rehab PT Goals Patient Stated Goal: to go home PT Goal Formulation: With patient Time For Goal Achievement: 12/22/22 Potential to Achieve Goals: Good    Frequency Min 2X/week     Co-evaluation               AM-PAC PT "6 Clicks" Mobility  Outcome Measure Help needed turning from your back to your side while in a flat bed without using bedrails?: A Little Help needed moving from lying on your back to sitting on the side of a flat bed without using bedrails?: A Little Help needed moving to and from a bed to a chair (including a wheelchair)?: A Little Help needed standing up from a chair using your arms (e.g., wheelchair or bedside chair)?: A  Little Help needed to walk in hospital room?: A Little Help needed climbing 3-5 steps with a railing? : A Little 6 Click Score: 18    End of Session Equipment Utilized During Treatment: Gait belt;Oxygen Activity Tolerance: Patient tolerated treatment well Patient left: in bed (seatet with dietician in room) Nurse Communication: Mobility status PT Visit Diagnosis: Muscle weakness (generalized) (M62.81);Difficulty in walking, not elsewhere classified (R26.2);Unsteadiness on feet (R26.81)    Time: [id number] PT Time Calculation (min) (ACUTE ONLY): 13 min   Charges:   PT Evaluation $PT Eval Low Complexity: 1 Low          Greggory Stallion, PT, DPT, GCS [phone]   Coda Filler 12/08/2022, 4:29 PM

## 2022-12-08 NOTE — Assessment & Plan Note (Signed)
Stable.  Continue ASA, Plavix

## 2022-12-08 NOTE — Plan of Care (Signed)

## 2022-12-08 NOTE — Progress Notes (Signed)
Progress Note   Patient: Austin Patterson [id number] DOB: 1956-01-30 DOA: 12/07/2022     0 DOS: the patient was seen and examined on 12/08/2022   Brief hospital course: HPI on admission, per Dr. Lorin Mercy: "Austin Patterson is a 67 y.o. male with medical history significant of COPD/pulmonary fibrosis, HTN, and HLD presenting with SOB, urinary retention.  He reports that  his breathing isn't going well.  They did a bladder scan, ?enlarged prostate.  Also stomach issues.   1- On O2 for 6-7 years.  Has IPF, COPD.  On Dupixent.  +DOE.  No change in cough, unable to produce sputum.   2- His brother died of colon cancer and his last C-scope was 17 years ago.  He has noticed some RUQ pains and it is tender.  He is able to eat/drink and is hungry. 3- He feels urgency but only able to pee a little bit.  Scan showed nothing in his bladder.  Has never been on prostate medication.    ER Course:   COPD exacerbation, mild acute on chronic hypoxia.  Neb broke -> SOB.  On 2L home O2. + wheezing, sputum production.  +frequency, nocturia, +BPH.  CT A/P negative.  As low as 87% on 2L.  Na++ 126 (130 in August), worse than usual.  "   Patient was admitted and started on IV fluids for hyponatremia. HCTZ is held, consider discontinuing this.  Bladder scans were done which initially showed no urinary retention. With IV fluids, patient began making urine again and not having any abdominal discomfort or distention.  TOC consulted to obtain a new home nebulizer machine for the patient.  Assessment and Plan: * Acute urinary retention Bladder scans with no retention. With IV fluids, patient voiding well today. Started on Flomax on admission, continue for now Continue to monitor  CAD (coronary artery disease) Stable.  Continue ASA, Plavix  Abdominal pain POA.  Imaging was unremarkable. --Continue PPI BID --Resumed on diet --2/20: seems resolved  Lung nodule seen on imaging study Noted on  CT Recommended to have f/u chest CT in 3 months  Chronic respiratory failure with hypoxia (HCC) Due to COPD and Pulmonary Fibrosis Patient has chronic shortness of breath and DOE but he does not currently appear to have a COPD exacerbation Baseline home O2 is 2 L/min. -He does not have fever or leukocytosis.  -Chest x-ray is not consistent with pneumonia -He was given 3 nebs and a dose of Solumedrol on presentation -His nebulizer machine was broken at home -Resumed neb treatments -New neb machine ordered, TOC consulted -Hold Dupixent while inpatient -Continue Advair (Dulera per formulary), Singulair  Hyperlipidemia Continue Lipitor  H/O heart artery stent See CAD  Essential hypertension Continue amlodipine, Toprol XL Continue losartan but hold HCTZ due to hyponatremia  IPF (idiopathic pulmonary fibrosis) (HCC) Stable.  See chronic respiratory failure.  Hyponatremia Na 126 on admission. Pt has mild chronic hyponatremia. --Hold HCTZ --Improving with IV hydration --Continue gentle IV hydration  --Repeat BMP this afternoon and in AM        Subjective: Pt awake seated edge of bed when seen this AM. Reports voiding well.  Breathing feels like at his baseline.  Main concern is need for a new neb machine which he's been trying to obtain.    Physical Exam: Vitals:   12/08/22 0403 12/08/22 0533 12/08/22 0713 12/08/22 1424  BP: 134/61 (!) 145/61 (!) 141/73   Pulse: 88 87 73 78  Resp: 17 18 18 $ 18  Temp: 97.8 F (36.6 C) 98.1 F (36.7 C) 97.8 F (36.6 C)   TempSrc:      SpO2: 97% 100% 97% 96%  Weight:      Height:       General exam: awake, alert, no acute distress HEENT: atraumatic, clear conjunctiva, anicteric sclera, moist mucus membranes, hearing grossly normal  Respiratory system: CTAB with faint crackles, no wheezes, rales or rhonchi, normal respiratory effort on 2 l?min [place] O2. Cardiovascular system: normal S1/S2, RRR, no JVD, murmurs, rubs, gallops,  no pedal  edema.   Gastrointestinal system: soft, NT, ND, no HSM felt, +bowel sounds. Central nervous system: A&O x4. no gross focal neurologic deficits, normal speech Extremities: moves all , no edema, normal tone Skin: dry, intact, normal temperature Psychiatry: normal mood, congruent affect, judgement and insight appear normal  Data Reviewed:  Notable labs --- Na improving 126 > >128,  Cl 91, glucose 126, Hbg 10.2 stable  Family Communication: None  Disposition: Status is: Inpatient Remains inpatient appropriate because: Remains on IV fluids pending further improvement in sodium level. Anticipate d/c tomorrow.    Planned Discharge Destination: Home with [facility]    Time spent: 38 minutes  Author: Ezekiel Slocumb, DO 12/08/2022 4:10 PM  For on call review [email or web address].

## 2022-12-08 NOTE — Assessment & Plan Note (Signed)
See CAD

## 2022-12-08 NOTE — Assessment & Plan Note (Signed)
-  Continue Lipitor °

## 2022-12-08 NOTE — Assessment & Plan Note (Signed)
Stable.  See chronic respiratory failure. Follow up with pulmonology

## 2022-12-08 NOTE — Evaluation (Signed)
Occupational Therapy Evaluation Patient Details Name: Austin Patterson MRN: [id number] DOB: [birth year] Today's Date: 12/08/2022   History of Present Illness 67 y.o. male here with multiple complaints.  The patient's primary complaint is progressively worsening epigastric discomfort and urinary retention.  He feels like he has to pee all the time.  This is a somewhat new symptom.  He states that he feels like he is not necessarily emptying completely.  Denies history of BPH.  He also states that he has run out of his albuterol nebulizer over the last several days and has had increasing shortness of breath.  This is worse with any kind of exertion.  He states that he does not know when he can get his albuterol fix.   Clinical Impression   Patient presenting with decreased Ind in self care,balance, functional mobility/transfers, endurance, and safety awareness. Patient reports being Ind at home and living alone. He has AD but endorses not using it. 2Ls O2 via [place] at baseline. Pt does have someone come each week to help with laundry and cleaning of home. During evaluation , pt on 3Ls O2 via [place] and stands with supervision but needing min guard or balance. Pt then given IV pole to push to bathroom with close supervision. Toileting performed with supervision and min cuing for safety awareness as well as assistance with line management. Pt returning to sit on EOB with all needs within reach and bed alarm activated. Pt desaturated to 87% with toileting task but quickly returns to 90% once seated. Pt would benefit from energy conservation and use of RW at discharge to decrease fall risk and conserve energy further.  Patient will benefit from acute OT to increase overall independence in the areas of ADLs, functional mobility, and safety awareness in order to safely discharge home.      Recommendations for follow up therapy are one component of a multi-disciplinary discharge planning process, led by the attending  physician.  Recommendations may be updated based on patient status, additional functional criteria and insurance authorization.   Follow Up Recommendations  No OT follow up     Assistance Recommended at Discharge PRN  Patient can return home with the following Help with stairs or ramp for entrance;Assist for transportation;Assistance with cooking/housework    Functional Status Assessment  Patient has had a recent decline in their functional status and demonstrates the ability to make significant improvements in function in a reasonable and predictable amount of time.  Equipment Recommendations  BSC/3in1       Precautions / Restrictions Precautions Precautions: Fall      Mobility Bed Mobility Overal bed mobility: Modified Independent             General bed mobility comments: increased time and HOB elevated but no physical assistance    Transfers Overall transfer level: Needs assistance Equipment used: 1 person hand held assist Transfers: Sit to/from Stand Sit to Stand: Supervision                  Balance Overall balance assessment: Needs assistance Sitting-balance support: Feet supported Sitting balance-Leahy Scale: Good     Standing balance support: During functional activity, Bilateral upper extremity supported Standing balance-Leahy Scale: Fair                             ADL either performed or assessed with clinical judgement   ADL Overall ADL's : Needs assistance/impaired     Grooming: Wash/dry  hands;Standing;Supervision/safety                   Toilet Transfer: Warehouse manager and Hygiene: Min guard;Sit to/from stand       Functional mobility during ADLs: Supervision/safety;Min guard       Vision Patient Visual Report: No change from baseline              Pertinent Vitals/Pain Pain Assessment Pain Assessment: No/denies pain     Hand Dominance Right   Extremity/Trunk  Assessment Upper Extremity Assessment Upper Extremity Assessment: Generalized weakness;Overall [facility] for tasks assessed   Lower Extremity Assessment Lower Extremity Assessment: Generalized weakness       Communication Communication Communication: No difficulties   Cognition Arousal/Alertness: Awake/alert Behavior During Therapy: WFL for tasks assessed/performed Overall Cognitive Status: Within Functional Limits for tasks assessed                                                  Home Living Family/patient expects to be discharged to:: Private residence Living Arrangements: Alone Available Help at Discharge: Neighbor;Available PRN/intermittently Type of Home: House Home Access: Ramped entrance     Home Layout: Laundry or work area in basement     [employer] Shower/Tub: Teacher, early years/pre: Standard Bathroom Accessibility: No   Home Equipment: Cane - single Barista (2 wheels);Rollator (4 wheels);Shower seat          Prior Functioning/Environment Prior Level of Function : Independent/Modified Independent;Driving             Mobility Comments: indep without AD with constant 02 2L ADLs Comments: Ind with self care and has someone come once every 1-2wks to assist with laundry and cleaning of home        OT Problem List: Decreased strength;Cardiopulmonary status limiting activity;Decreased activity tolerance;Decreased safety awareness;Impaired balance (sitting and/or standing);Decreased knowledge of use of DME or AE      OT Treatment/Interventions: Self-care/ADL training;Therapeutic exercise;Therapeutic activities;Energy conservation;DME and/or AE instruction;Patient/family education;Balance training    OT Goals(Current goals can be found in the care plan section) Acute Rehab OT Goals Patient Stated Goal: to return home OT Goal Formulation: With patient Time For Goal Achievement: 12/22/22 Potential to Achieve Goals:  Good ADL Goals Pt Will Perform Grooming: (P) with modified independence;standing Pt Will Perform Lower Body Dressing: (P) with modified independence;sit to/from stand Pt Will Transfer to Toilet: (P) with modified independence;ambulating Pt Will Perform Toileting - Clothing Manipulation and hygiene: (P) with modified independence;sit to/from stand Additional ADL Goal #1: (P) Pt will demonstrate energy conservation techniques during self care tasks with minimal cuing.  OT Frequency: Min 2X/week       AM-PAC OT "6 Clicks" Daily Activity     Outcome Measure Help from another person eating meals?: None Help from another person taking care of personal grooming?: None Help from another person toileting, which includes using toliet, bedpan, or urinal?: A Little Help from another person bathing (including washing, rinsing, drying)?: A Little Help from another person to put on and taking off regular upper body clothing?: None Help from another person to put on and taking off regular lower body clothing?: A Little 6 Click Score: 21   End of Session Nurse Communication: Mobility status  Activity Tolerance: Patient tolerated treatment well Patient left: in bed;with call bell/phone within reach;with bed  alarm set  OT Visit Diagnosis: Unsteadiness on feet (R26.81);Repeated falls (R29.6);Muscle weakness (generalized) (M62.81)                Time: [id number] OT Time Calculation (min): 19 min Charges:  OT General Charges $OT Visit: 1 Visit OT Evaluation $OT Eval Low Complexity: 1 Low OT Treatments $Self Care/Home Management : 8-22 mins  Darleen Crocker, MS, OTR/L , CBIS ascom [phone]  12/08/22, 1:54 PM

## 2022-12-08 NOTE — Hospital Course (Signed)
HPI on admission, per Dr. Lorin Mercy: "Austin Patterson is a 67 y.o. male with medical history significant of COPD/pulmonary fibrosis, HTN, and HLD presenting with SOB, urinary retention.  He reports that  his breathing isn't going well.  They did a bladder scan, ?enlarged prostate.  Also stomach issues.   1- On O2 for 6-7 years.  Has IPF, COPD.  On Dupixent.  +DOE.  No change in cough, unable to produce sputum.   2- His brother died of colon cancer and his last C-scope was 17 years ago.  He has noticed some RUQ pains and it is tender.  He is able to eat/drink and is hungry. 3- He feels urgency but only able to pee a little bit.  Scan showed nothing in his bladder.  Has never been on prostate medication.    ER Course:   COPD exacerbation, mild acute on chronic hypoxia.  Neb broke -> SOB.  On 2L home O2. + wheezing, sputum production.  +frequency, nocturia, +BPH.  CT A/P negative.  As low as 87% on 2L.  Na++ 126 (130 in August), worse than usual.  "   Patient was admitted and started on IV fluids for hyponatremia. HCTZ is held, consider discontinuing this.  Bladder scans were done which initially showed no urinary retention. With IV fluids, patient began making urine again and not having any abdominal discomfort or distention.  TOC consulted to obtain a new home nebulizer machine for the patient.

## 2022-12-08 NOTE — Assessment & Plan Note (Signed)
Na 126 on admission. Pt has mild chronic hyponatremia. --Hold HCTZ --Improving with IV hydration --Continue gentle IV hydration  --Repeat BMP this afternoon and in AM

## 2022-12-08 NOTE — Plan of Care (Signed)

## 2022-12-08 NOTE — TOC Initial Note (Signed)
Transition of Care [facility]) - Initial/Assessment Note    Patient Details  Name: Austin Patterson MRN: [id number] Date of Birth: 20-May-1956  Transition of Care [facility]) CM/SW Contact:    Beverly Sessions, RN Phone Number: 12/08/2022, 3:29 PM  Clinical Narrative:                 Admitted for: urinary retention Admitted from: home alone PCP: [facility]  Current home health/prior home health/DME: O2 and RW  Patient states his landlord will transport at discharge and that he already has his portable O2 in the room.  Nebulizer referral made to [facility] with [facility]   Patient in agreement to home health.  States he would like to use [facility].  Referral made to [place] with [facility] for RN and PT         Patient Goals and CMS Choice            Expected Discharge Plan and Services                                              Prior Living Arrangements/Services                       Activities of Daily Living Home Assistive Devices/Equipment: Oxygen, Nebulizer, Eyeglasses ADL Screening (condition at time of admission) Patient's cognitive ability adequate to safely complete daily activities?: Yes Is the patient deaf or have difficulty hearing?: No Does the patient have difficulty seeing, even when wearing glasses/contacts?: No Does the patient have difficulty concentrating, remembering, or making decisions?: No Patient able to express need for assistance with ADLs?: Yes Does the patient have difficulty dressing or bathing?: No Independently performs ADLs?: Yes (appropriate for developmental age) Does the patient have difficulty walking or climbing stairs?: No Weakness of Legs: None Weakness of Arms/Hands: None  Permission Sought/Granted                  Emotional Assessment              Admission diagnosis:  COPD with acute exacerbation ([place]) [J44.1] Patient Active Problem List   Diagnosis Date Noted   Acute urinary retention 12/07/2022    Lung nodule seen on imaging study 12/07/2022   Abdominal pain 12/07/2022   COPD exacerbation ([place]) 02/23/2022   GERD without esophagitis 02/23/2022   Chest pain 01/24/2022   Alpha-1-antitrypsin deficiency carrier 01/10/2021   Asthma 03/12/2020   Chronic respiratory failure with hypoxia ([place]) 03/12/2020   Hyperlipidemia    Asthma-COPD overlap syndrome    Bleeding nose    Alcohol abuse    RUQ abdominal pain    Anaphylaxis 11/01/2019   Hypotension 11/01/2019   CAD (coronary artery disease) 11/01/2019   H/O heart artery stent 11/01/2019   Acute on chronic respiratory failure with hypoxia ([place])    RUQ pain    Essential hypertension    IPF (idiopathic pulmonary fibrosis) ([place]) 08/30/2019   Hypomagnesemia 03/31/2017   Hiatal hernia    Reflux esophagitis    Hematemesis without nausea    Hyponatremia 09/21/2016   Multifocal pneumonia 12/09/2015   PCP:  Rutherford Limerick, PA Pharmacy:   [place], [place] - [place], [place] [id number], [place] [address] Phone: [phone] Fax: [phone]  [id number] [place]  [facility] [place] [address] Phone: [phone] Fax: [place], [place] [address] [place] [address] Phone: [phone] Fax: [phone]  [id number]Phone: [phone]: [phone] [place]Phone: [phone]: [phone] [facility] [place]Phone: [phone]: [phone]    Social Determinants of Health (SDOH) Social History: [place] No Food  Insecurity (12/07/2022)  Housing: Low Risk  (12/07/2022)  Transportation Needs: No Transportation Needs (12/07/2022)  Utilities: Not At Risk (12/07/2022)  Tobacco Use: Medium Risk (12/07/2022)   SDOH Interventions:     Readmission Risk Interventions     No data to display

## 2022-12-08 NOTE — Assessment & Plan Note (Signed)
Continue amlodipine, Toprol XL Continue losartan but hold HCTZ due to hyponatremia

## 2022-12-08 NOTE — Assessment & Plan Note (Signed)
Due to COPD and Pulmonary Fibrosis Patient has chronic shortness of breath and DOE but he does not currently appear to have a COPD exacerbation Baseline home O2 is 2 L/min. -He does not have fever or leukocytosis.  -Chest x-ray is not consistent with pneumonia -He was given 3 nebs and a dose of Solumedrol on presentation -His nebulizer machine was broken at home -Resumed neb treatments -New neb machine ordered, TOC consulted -Hold Dupixent while inpatient -Continue Advair (Dulera per formulary), Singulair

## 2022-12-08 NOTE — Progress Notes (Signed)
Initial Nutrition Assessment  DOCUMENTATION CODES:   Obesity unspecified  INTERVENTION:   Ensure Max protein supplement BID, each supplement provides 150kcal and 30g of protein.  MVI, folic acid and thiamine po daily   Pt at high refeed risk; recommend monitor potassium, magnesium and phosphorus labs daily until stable  NUTRITION DIAGNOSIS:   Increased nutrient needs related to catabolic illness (COPD, pulmonary fibrosis) as evidenced by estimated needs.  GOAL:   Patient will meet greater than or equal to 90% of their needs  MONITOR:   PO intake, I & O's, Supplement acceptance, Skin, Labs, Weight trends  REASON FOR ASSESSMENT:   Consult Assessment of nutrition requirement/status  ASSESSMENT:   67 y/o male with h/o pulmonary fibrosis, COPD, HTN, CAD, HLD, hiatal hernia, etoh abuse, GERD and lung nodule who is admitted with abdominal pain and urinary retention.  Met with pt in room today. Pt reports good appetite and oral intake at baseline and in hospital. Pt is eating 100% of his meals. Pt reports that he is having some constipation and is requesting something to help with bowel movement today. Pt reports abdominal pain pta but denies any abdominal pain today. Pt is noted to have some thickening of his stomach wall on CT scan. Per chart, pt appears weight stable since admission. RD discussed with pt the importance of adequate nutrition needed to preserve lean muscle. Pt is willing to drink chocolate Ensure in hospital; RD will order. Pt is likely at refeed risk secondary to etoh abuse.   Medications reviewed and include: aspirin, plavix, colace, lovenox, protonix, LRS @50ml$ /hr  Labs reviewed: Na 129(L), K 3.6 wnl Hgb 10.2(L), Hct 30.8(L)  NUTRITION - FOCUSED PHYSICAL EXAM:  Flowsheet Row Most Recent Value  Orbital Region No depletion  Upper Arm Region Mild depletion  Thoracic and Lumbar Region No depletion  Buccal Region No depletion  Temple Region No depletion   Clavicle Bone Region Mild depletion  Clavicle and Acromion Bone Region Mild depletion  Scapular Bone Region No depletion  Dorsal Hand Mild depletion  Patellar Region No depletion  Anterior Thigh Region No depletion  Posterior Calf Region No depletion  Edema (RD Assessment) None  Hair Reviewed  Eyes Reviewed  Mouth Reviewed  Skin Reviewed  Nails Reviewed   Diet Order:   Diet Order             Diet regular Room service appropriate? Yes; Fluid consistency: Thin  Diet effective now                  EDUCATION NEEDS:   Education needs have been addressed  Skin:  Skin Assessment: Reviewed RN Assessment  Last BM:  2/19  Height:   Ht Readings from Last 1 Encounters:  12/07/22 5' 6"$  (1.676 m)    Weight:   Wt Readings from Last 1 Encounters:  12/07/22 92.1 kg    Ideal Body Weight:  64.5 kg  BMI:  Body mass index is 32.77 kg/m.  Estimated Nutritional Needs:   Kcal:  2000-2300kcal/day  Protein:  100-115g/day  Fluid:  1.7-1.9L/day  Koleen Distance MS, RD, LDN Please refer to [facility] for RD and/or RD on-call/weekend/after hours pager

## 2022-12-09 DIAGNOSIS — J84112 Idiopathic pulmonary fibrosis: Secondary | ICD-10-CM

## 2022-12-09 DIAGNOSIS — R1084 Generalized abdominal pain: Secondary | ICD-10-CM

## 2022-12-09 DIAGNOSIS — J9611 Chronic respiratory failure with hypoxia: Secondary | ICD-10-CM

## 2022-12-09 DIAGNOSIS — E871 Hypo-osmolality and hyponatremia: Principal | ICD-10-CM

## 2022-12-09 LAB — PHOSPHORUS: Phosphorus: 3.4 mg/dL (ref 2.5–4.6)

## 2022-12-09 LAB — BASIC METABOLIC PANEL
Anion gap: 10 (ref 5–15)
BUN: 18 mg/dL (ref 8–23)
CO2: 28 mmol/L (ref 22–32)
Calcium: 8.6 mg/dL — ABNORMAL LOW (ref 8.9–10.3)
Chloride: 95 mmol/L — ABNORMAL LOW (ref 98–111)
Creatinine, Ser: 0.72 mg/dL (ref 0.61–1.24)
GFR, Estimated: >60 mL/min (ref >60)
Glucose, Bld: 120 mg/dL — ABNORMAL HIGH (ref 70–99)
Potassium: 4.1 mmol/L (ref 3.5–5.1)
Sodium: 133 mmol/L — ABNORMAL LOW (ref 135–145)

## 2022-12-09 LAB — MAGNESIUM: Magnesium: 1.8 mg/dL (ref 1.7–2.4)

## 2022-12-09 MED ORDER — ENSURE MAX PROTEIN PO LIQD: 11.0000 [oz_av] | Freq: Two times a day (BID) | ORAL | Status: DC

## 2022-12-09 MED ORDER — TAMSULOSIN HCL 0.4 MG PO CAPS: 0.4000 mg | ORAL_CAPSULE | Freq: Every day | ORAL | 2 refills | Status: DC

## 2022-12-09 MED ORDER — SENNOSIDES-DOCUSATE SODIUM 8.6-50 MG PO TABS: 1.0000 | ORAL_TABLET | Freq: Two times a day (BID) | ORAL | 1 refills | Status: DC

## 2022-12-09 NOTE — Discharge Summary (Incomplete)
Physician Discharge Summary   Patient: Austin Patterson MRN: [id number] DOB: [birth year]  Admit date:     12/07/2022  Discharge date: 12/10/22  Discharge Physician: Ezekiel Slocumb   PCP: Rutherford Limerick, PA   Recommendations at discharge:   Follow up with Primary Care Follow up with Pulmonology Follow up on ?BPH symptoms, started on Flomax. Consider urology referral if indicated Repeat BMP, Mg, CBC in 1-2 weeks  Discharge Diagnoses: Active Problems:   CAD (coronary artery disease)   Hyponatremia   IPF (idiopathic pulmonary fibrosis) (HCC)   Essential hypertension   H/O heart artery stent   Hyperlipidemia   Chronic respiratory failure with hypoxia (HCC)   Lung nodule seen on imaging study   Constipation  Resolved Problems:   COPD with acute exacerbation ([place])   Acute urinary retention   Abdominal pain  Hospital Course: HPI on admission, per Dr. Lorin Mercy: "Austin Patterson is a 67 y.o. male with medical history significant of COPD/pulmonary fibrosis, HTN, and HLD presenting with SOB, urinary retention.  He reports that  his breathing isn't going well.  They did a bladder scan, ?enlarged prostate.  Also stomach issues.   1- On O2 for 6-7 years.  Has IPF, COPD.  On Dupixent.  +DOE.  No change in cough, unable to produce sputum.   2- His brother died of colon cancer and his last C-scope was 17 years ago.  He has noticed some RUQ pains and it is tender.  He is able to eat/drink and is hungry. 3- He feels urgency but only able to pee a little bit.  Scan showed nothing in his bladder.  Has never been on prostate medication.    ER Course:   COPD exacerbation, mild acute on chronic hypoxia.  Neb broke -> SOB.  On 2L home O2. + wheezing, sputum production.  +frequency, nocturia, +BPH.  CT A/P negative.  As low as 87% on 2L.  Na++ 126 (130 in August), worse than usual.  "   Patient was admitted and started on IV fluids for hyponatremia. HCTZ is held, consider discontinuing  this.  Bladder scans were done which initially showed no urinary retention. With IV fluids, patient began making urine again and not having any abdominal discomfort or distention.  TOC consulted to obtain a new home nebulizer machine for the patient.  2/21 -- pt doing well today.  Neb machine delivered to room. Medically stable for discharge today.   He requests to continue on Flomax despite bladder scans not indicating actual retention, He reports intermittent lower urinary tract symptoms chronically.   Assessment and Plan: CAD (coronary artery disease) Stable.  Continue ASA, Plavix  Constipation Suspected to contribute to his abdominal pain which improved after BM. Continue stool softeners as needed for regular bowel habits.  Lung nodule seen on imaging study Noted on CT Recommended to have f/u chest CT in 3 months  Chronic respiratory failure with hypoxia (HCC) Due to COPD and Pulmonary Fibrosis Patient has chronic shortness of breath and DOE but he does not currently appear to have a COPD exacerbation Baseline home O2 is 2 L/min. -He does not have fever or leukocytosis.  -Chest x-ray is not consistent with pneumonia -He was given 3 nebs and a dose of Solumedrol on presentation -His nebulizer machine was broken at home -Resumed neb treatments -New neb machine ordered, TOC consulted -Hold Dupixent while inpatient -Continue Advair (Dulera per formulary), Singulair  Hyperlipidemia Continue Lipitor  H/O heart artery stent See CAD  Essential hypertension Continue amlodipine, Toprol XL Continue losartan but hold HCTZ due to hyponatremia  IPF (idiopathic pulmonary fibrosis) (HCC) Stable.  See chronic respiratory failure. Follow up with pulmonology  Hyponatremia Acute on Chronic. Na 126 on admission, baseline appears low 130's.   --Improved with IV hydration --Monitor BMP at follow up  Abdominal pain-resolved as of 12/10/2022 POA.  Imaging was  unremarkable. --Continue PPI BID --Resumed on diet --2/20: seems resolved  Acute urinary retention-resolved as of 12/10/2022 RULED OUT - Bladder scans with no retention. With IV fluids, patient voiding well today. Started on Flomax on admission, continue for now Patient requested to continue Flomax although not clear he needs it.  He was advised of side effect of orthostatic hypotension and to stop Flomax if dizzy/lightheaded on rising.  Recommend PCP follow up and consider Urology referral.          Consultants: None Procedures performed: None   Disposition: Home  Diet recommendation:  Cardiac diet  DISCHARGE MEDICATION: Allergies as of 12/09/2022       Reactions   Amoxicillin Anaphylaxis   Tizanidine    Feet and ankle swell         Medication List     TAKE these medications    acetaminophen 500 MG tablet Commonly known as: TYLENOL Take 1,000 mg by mouth every 6 (six) hours as needed for moderate pain or headache.   AeroChamber MV inhaler Use as instructed   albuterol 108 (90 Base) MCG/ACT inhaler Commonly known as: VENTOLIN HFA Inhale 2 puffs into the lungs every 6 (six) hours as needed for wheezing or shortness of breath.   amLODipine 10 MG tablet Commonly known as: NORVASC Take 1 tablet (10 mg total) by mouth daily.   aspirin EC 81 MG tablet Take 1 tablet (81 mg total) by mouth daily.   atorvastatin 40 MG tablet Commonly known as: LIPITOR Take 1 tablet (40 mg total) by mouth at bedtime.   Calcium 600/Vitamin D 600-10 MG-MCG Tabs Generic drug: Calcium Carb-Cholecalciferol Take 1 tablet by mouth 2 (two) times daily.   clopidogrel 75 MG tablet Commonly known as: PLAVIX Take 1 tablet (75 mg total) by mouth daily.   Dupixent 300 MG/2ML Sopn Generic drug: Dupilumab Inject 300 mg into the skin every 14 (fourteen) days.   Ensure Max Protein Liqd Take 330 mLs (11 oz total) by mouth 2 (two) times daily.   EPINEPHrine 0.3 mg/0.3 mL Soaj  injection Commonly known as: EPI-PEN Inject 0.3 mg into the muscle as needed for anaphylaxis.   fluticasone-salmeterol 250-50 MCG/ACT Aepb Commonly known as: Advair Diskus Inhale 1 puff into the lungs in the morning and at bedtime.   ipratropium-albuterol 0.5-2.5 (3) MG/3ML Soln Commonly known as: DUONEB INHALE 1 VIAL THROUGH NEBULIZER EVERY 6 HOURS   Klor-Con M20 20 MEQ tablet Generic drug: potassium chloride SA Take 20 mEq by mouth daily.   losartan-hydrochlorothiazide 100-25 MG tablet Commonly known as: HYZAAR Take 1 tablet by mouth daily.   magnesium oxide 400 (241.3 Mg) MG tablet Commonly known as: MAG-OX Take 1 tablet (400 mg total) by mouth daily.   metoprolol succinate 50 MG 24 hr tablet Commonly known as: TOPROL-XL Take 1 tablet (50 mg total) by mouth daily.   montelukast 10 MG tablet Commonly known as: SINGULAIR Take 10 mg by mouth daily.   multivitamin with minerals Tabs tablet Take 1 tablet by mouth daily.   pantoprazole 40 MG tablet Commonly known as: PROTONIX Take 1 tablet (40 mg total) by mouth  2 (two) times daily.   senna-docusate 8.6-50 MG tablet Commonly known as: Senokot-S Take 1 tablet by mouth 2 (two) times daily.   tamsulosin 0.4 MG Caps capsule Commonly known as: FLOMAX Take 1 capsule (0.4 mg total) by mouth daily after supper.        Discharge Exam: Filed Weights   12/07/22 0942  Weight: 92.1 kg   General exam: awake, alert, no acute distress HEENT: atraumatic, clear conjunctiva, anicteric sclera, moist mucus membranes, hearing grossly normal  Respiratory system: CTAB mildly diminished, no wheezes, rales or rhonchi, normal respiratory effort. Cardiovascular system: normal S1/S2, RRR, no JVD, murmurs, rubs, gallops, no pedal edema.   Gastrointestinal system: soft, NT, ND, no HSM felt, +bowel sounds. Central nervous system: A&O x 4. no gross focal neurologic deficits, normal speech Extremities: moves all, no edema, normal tone Skin:  dry, intact, normal temperature Psychiatry: normal mood, congruent affect, judgement and insight appear normal   Condition at discharge: stable  The results of significant diagnostics from this hospitalization (including imaging, microbiology, ancillary and laboratory) are listed below for reference.   Imaging Studies: CT ABDOMEN PELVIS W CONTRAST  Addendum Date: 12/07/2022   ADDENDUM REPORT: 12/07/2022 11:54 ADDENDUM: 13 x 8 mm new right lower lung nodule. Recommend follow-up. Lung-RADS 4A, suspicious. Follow up low-dose chest CT without contrast in 3 months (please use the following order, "CT CHEST LCS NODULE FOLLOW-UP W/O CM") is recommended. Alternatively, PET may be considered when there is a solid component 108m or larger. Electronically Signed   By: AJill SideM.D.   On: 12/07/2022 11:54   Result Date: 12/07/2022 CLINICAL DATA:  Abdominal pain EXAM: CT ABDOMEN AND PELVIS WITH CONTRAST TECHNIQUE: Multidetector CT imaging of the abdomen and pelvis was performed using the standard protocol following bolus administration of intravenous contrast. RADIATION DOSE REDUCTION: This exam was performed according to the departmental dose-optimization program which includes automated exposure control, adjustment of the mA and/or kV according to patient size and/or use of iterative reconstruction technique. CONTRAST:  1074mOMNIPAQUE IOHEXOL 300 MG/ML  SOLN COMPARISON:  Noncontrast CT 09/23/2020.  Chest CT 02/09/2020 FINDINGS: Lower chest: Lung bases are clear. No pleural effusion. Centrilobular changes identified including a bleb/bulla along the middle lobe. Also a noncalcified nodule which is new at the right lower lobe on series 4, image 8 measuring 13 x 8 mm. There is a calcified nodule medially in the right lung base which is stable consistent with old granulomatous disease separately. Coronary artery calcifications are seen. Hepatobiliary: No space-occupying liver lesion. Patent portal vein. Gallbladder  is nondilated. Pancreas: Unremarkable. No pancreatic ductal dilatation or surrounding inflammatory changes. Spleen: Normal in size without focal abnormality. Adrenals/Urinary Tract: Stable bilateral adrenal nodules are identified consistent with adenomas with the right measuring 2.4 by 1.7 cm and left 1.6 by 1.3 cm. No specific imaging follow-up. Areas of focal bilateral renal atrophy. No enhancing renal mass or collecting system dilatation. Few tiny subcentimeter benign cysts are seen particularly the upper pole right kidney. No specific imaging follow-up. Bosniak 1 lesions. Ureters have normal course and caliber down to the bladder. Bladder is underdistended. Stomach/Bowel: Large bowel has a normal course and caliber with scattered stool. Normal appendix. Small bowel is nondilated. Few sigmoid colon diverticula. The stomach is relatively collapsed. There is some wall thickening of the stomach diffusely of up to 3 cm, more than expected for even the collapsed stomach. Please correlate with any particular symptoms of the stomach. Vascular/Lymphatic: Diffuse vascular calcifications. Areas of calcification along  the lumen distal along the abdominal aorta with the non flow-limiting short segment dissection, unchanged from previous. There are areas of stenosis along the common iliac arteries as well as along the common femoral areas where there are significant plaque. Please correlate with any particular symptoms. No specific abnormal lymph node enlargement identified in the abdomen and pelvis. Reproductive: Prominent prostate. Other: Small fat containing umbilical and inguinal hernias. No free air or free fluid. There is some fatty areas along the pelvic musculature. Musculoskeletal: Curvature of the spine. Scattered degenerative changes along the lumbar spine in the pelvis. Pars defects seen at L5 with trace listhesis. There is augmentation cement at the T10 level with mild compression. IMPRESSION: No bowel  obstruction, free air or free fluid. Slight wall thickening of the stomach despite the level of collapse. Please correlate with any symptoms. Diffuse vascular calcifications are identified. Areas of stenosis. Please correlate with any symptoms of claudication. Bilateral adrenal adenomas Electronically Signed: By: Jill Side M.D. On: 12/07/2022 11:44   DG Chest 2 View  Result Date: 12/07/2022 CLINICAL DATA:  Shortness of breath EXAM: CHEST - 2 VIEW COMPARISON:  Chest radiograph 05/26/2022 FINDINGS: The cardiomediastinal silhouette is normal. Confluent opacity projecting over the right lower lobe is new from the most recent prior study but is similar to more remote prior studies, for example the study from 01/24/2022, favored to reflect atelectasis along the large bulla in this location seen on prior CT. Linear scarring in the left midlung is unchanged. Otherwise, there is no focal airspace disease. There is no pulmonary edema. There is no pleural effusion or pneumothorax The patient is status post vertebral augmentation at T6 and T10. There is no acute osseous abnormality. IMPRESSION: 1. Confluent opacity in the right midlung may reflect atelectasis adjacent to the large bulla in this location seen on prior CT. 2. Otherwise, no radiographic evidence of acute cardiopulmonary pathology. Electronically Signed   By: Valetta Mole M.D.   On: 12/07/2022 11:14    Microbiology: Results for orders placed or performed during the hospital encounter of 12/07/22  Resp panel by RT-PCR (RSV, Flu A&B, Covid) Anterior Nasal Swab     Status: None   Collection Time: 12/07/22 10:19 AM   Specimen: Anterior Nasal Swab  Result Value Ref Range Status   SARS Coronavirus 2 by RT PCR NEGATIVE NEGATIVE Final    Comment: (NOTE) SARS-CoV-2 target nucleic acids are NOT DETECTED.  The SARS-CoV-2 RNA is generally detectable in upper respiratory specimens during the acute phase of infection. The lowest concentration of SARS-CoV-2  viral copies this assay can detect is 138 copies/mL. A negative result does not preclude SARS-Cov-2 infection and should not be used as the sole basis for treatment or other patient management decisions. A negative result may occur with  improper specimen collection/handling, submission of specimen other than nasopharyngeal swab, presence of viral mutation(s) within the areas targeted by this assay, and inadequate number of viral copies(<138 copies/mL). A negative result must be combined with clinical observations, patient history, and epidemiological information. The expected result is Negative.  Fact Sheet for Patients:  EntrepreneurPulse.com.au  Fact Sheet for Healthcare Providers:  IncredibleEmployment.be  This test is no t yet approved or cleared by the [place] FDA and  has been authorized for detection and/or diagnosis of SARS-CoV-2 by FDA under an Emergency Use Authorization (EUA). This EUA will remain  in effect (meaning this test can be used) for the duration of the COVID-19 declaration under Section 564(b)(1) of the Act,  21 U.S.C.section 360bbb-3(b)(1), unless the authorization is terminated  or revoked sooner.       Influenza A by PCR NEGATIVE NEGATIVE Final   Influenza B by PCR NEGATIVE NEGATIVE Final    Comment: (NOTE) The Xpert Xpress SARS-CoV-2/FLU/RSV plus assay is intended as an aid in the diagnosis of influenza from Nasopharyngeal swab specimens and should not be used as a sole basis for treatment. Nasal washings and aspirates are unacceptable for Xpert Xpress SARS-CoV-2/FLU/RSV testing.  Fact Sheet for Patients: EntrepreneurPulse.com.au  Fact Sheet for Healthcare Providers: IncredibleEmployment.be  This test is not yet approved or cleared by the [place] FDA and has been authorized for detection and/or diagnosis of SARS-CoV-2 by FDA under an Emergency Use Authorization  (EUA). This EUA will remain in effect (meaning this test can be used) for the duration of the COVID-19 declaration under Section 564(b)(1) of the Act, 21 U.S.C. section 360bbb-3(b)(1), unless the authorization is terminated or revoked.     Resp Syncytial Virus by PCR NEGATIVE NEGATIVE Final    Comment: (NOTE) Fact Sheet for Patients: EntrepreneurPulse.com.au  Fact Sheet for Healthcare Providers: IncredibleEmployment.be  This test is not yet approved or cleared by the [place] FDA and has been authorized for detection and/or diagnosis of SARS-CoV-2 by FDA under an Emergency Use Authorization (EUA). This EUA will remain in effect (meaning this test can be used) for the duration of the COVID-19 declaration under Section 564(b)(1) of the Act, 21 U.S.C. section 360bbb-3(b)(1), unless the authorization is terminated or revoked.  Performed at [facility], [address] [place], [place] [address]     Labs: CBC: Recent Labs  Lab 12/07/22 1019 12/08/22 0239  WBC 6.1 4.3  NEUTROABS 4.4  --   HGB 10.2* 10.2*  HCT 31.3* 30.8*  MCV 93.2 91.9  PLT 354 0000000   Basic Metabolic Panel: Recent Labs  Lab 12/07/22 1019 12/08/22 0239 12/08/22 1430 12/09/22 0450  NA 126* 128* 129* 133*  K 3.9 3.7 3.6 4.1  CL 88* 90* 91* 95*  CO2 28 26 29 28  $ GLUCOSE 125* 184* 126* 120*  BUN 12 19 19 18  $ CREATININE 0.65 0.79 0.83 0.72  CALCIUM 9.0 9.0 9.0 8.6*  MG  --   --   --  1.8  PHOS  --   --   --  3.4   Liver Function Tests: Recent Labs  Lab 12/07/22 1019  AST 28  ALT 22  ALKPHOS 68  BILITOT 0.6  PROT 7.4  ALBUMIN 4.5   CBG: No results for input(s): "GLUCAP" in the last 168 hours.  Discharge time spent: less than 30 minutes.  Signed: Ezekiel Slocumb, DO [facility] 12/10/2022

## 2022-12-09 NOTE — TOC Transition Note (Signed)
Transition of Care [facility]) - CM/SW Discharge Note   Patient Details  Name: Austin Patterson MRN: [id number] Date of Birth: Jun 25, 1956  Transition of [facility]) CM/SW Contact:  Beverly Sessions, RN Phone Number: 12/09/2022, 10:02 AM   Clinical Narrative:     Patient to discharge today [place] with [place] notified of discharge         Patient Goals and CMS Choice      Discharge Placement                         Discharge Plan and Services Additional resources added to the After Visit Summary for                                       Social Determinants of Health (SDOH) Interventions SDOH Screenings   Food Insecurity: No Food Insecurity (12/07/2022)  Housing: Low Risk  (12/07/2022)  Transportation Needs: No Transportation Needs (12/07/2022)  Utilities: Not At Risk (12/07/2022)  Tobacco Use: Medium Risk (12/07/2022)     Readmission Risk Interventions     No data to display

## 2022-12-09 NOTE — Plan of Care (Signed)

## 2022-12-10 ENCOUNTER — Ambulatory Visit: Payer: Medicare HMO | Admitting: Pulmonary Disease

## 2022-12-10 ENCOUNTER — Encounter: Payer: Self-pay | Admitting: Internal Medicine

## 2022-12-10 ENCOUNTER — Encounter: Payer: Self-pay | Admitting: Pulmonary Disease

## 2022-12-10 VITALS — BP 164/96 | HR 78 | Temp 97.1°F | Ht 66.0 in | Wt 203.0 lb

## 2022-12-10 DIAGNOSIS — J9611 Chronic respiratory failure with hypoxia: Secondary | ICD-10-CM | POA: Diagnosis not present

## 2022-12-10 DIAGNOSIS — J4550 Severe persistent asthma, uncomplicated: Secondary | ICD-10-CM | POA: Diagnosis not present

## 2022-12-10 DIAGNOSIS — J4489 Other specified chronic obstructive pulmonary disease: Secondary | ICD-10-CM | POA: Diagnosis not present

## 2022-12-10 DIAGNOSIS — E8801 Alpha-1-antitrypsin deficiency: Secondary | ICD-10-CM

## 2022-12-10 DIAGNOSIS — K5900 Constipation, unspecified: Secondary | ICD-10-CM | POA: Diagnosis present

## 2022-12-10 NOTE — Patient Instructions (Addendum)
Continue your Advair and DuoNebs as you are doing.  Continue your Dupixent injections.  Over-the-counter he can get the Mucinex maximum strength this is the best time for you and you can take it twice a day.  Make sure you drink plenty of water.   We will see you in follow-up in 3 months time call sooner should any new problems arise.

## 2022-12-10 NOTE — Progress Notes (Signed)
Subjective:    Patient ID: Austin Patterson, male    DOB: [birth year], 67 y.o.   MRN: [id number] Patient Care Team: Rutherford Limerick, PA as PCP - General (Physician Assistant) Kate Sable, MD as PCP - Cardiology (Cardiology) Tyler Pita, MD as Consulting Physician (Pulmonary Disease)  Chief Complaint  Patient presents with   Follow-up    Breathing is normal. SOB with exertion. No wheezing. Dry cough.    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  This is a scheduled visit.  He was last seen on 21 August 2022 and at that time was doing well was well compensated.  He has chronic respiratory failure with hypoxia due to the issues noted above.  He is compliant with oxygen at 2 L/min 24/7.  He has alpha-1 antitrypsin phenotype MZ but normal alpha-1 levels (level was 111 on 10/30/2021).  He is currently on Dupixent for management of his asthma component.  Since his last visit in November he required admission to [facility] from 19 February until yesterday 21 February.  He was noted to have a minor exacerbation after his nebulizer machine broke down and he could not use the medication.  In addition he was having some issues with urinary retention that caused him some volume overload.  These issues have been resolved now and he has obtained a new nebulizer machine.  On today's visit he reports that his dyspnea is at baseline with no new complaint.  He has been compliant with his Dupixent and states that he is doing well.  He has had no fevers, chills or sweats.  No increase in cough or sputum production.  No hemoptysis. He notes no other issues.  He has not had any chest pain, no recent sick contacts.  He has had tenacious sputum wants to know what is the "best Mucinex".  He has not required steroid pulses since his last visit, he only  received 1 single dose of Solu-Medrol while admitted but steroids were not continued on discharge.    Review of Systems A 10 point review of systems was performed and it is as noted above otherwise negative.  Patient Active Problem List   Diagnosis Date Noted   Acute urinary retention 12/07/2022   Lung nodule seen on imaging study 12/07/2022   Abdominal pain 12/07/2022   COPD exacerbation ([place]) 02/23/2022   GERD without esophagitis 02/23/2022   Chest pain 01/24/2022   Alpha-1-antitrypsin deficiency carrier 01/10/2021   Asthma 03/12/2020   Chronic respiratory failure with hypoxia ([place]) 03/12/2020   Hyperlipidemia    Asthma-COPD overlap syndrome    Bleeding nose    Alcohol abuse    RUQ abdominal pain    Anaphylaxis 11/01/2019   Hypotension 11/01/2019   CAD (coronary artery disease) 11/01/2019   H/O heart artery stent 11/01/2019   Acute on chronic respiratory failure with hypoxia (HCC)    RUQ pain    Essential hypertension    IPF (idiopathic pulmonary fibrosis) ([place]) 08/30/2019   Hypomagnesemia 03/31/2017   Hiatal hernia    Reflux esophagitis    Hematemesis without nausea    Hyponatremia 09/21/2016   Multifocal pneumonia 12/09/2015   Social History   Tobacco Use   Smoking status: Former    Packs/day: 2.00    Years: 50.00    Total pack years: 100.00    Types: Cigarettes  Quit date: 10/14/2015    Years since quitting: 7.1   Smokeless tobacco: Former  Substance Use Topics   Alcohol use: Yes    Alcohol/week: 56.0 standard drinks of alcohol    Types: 56 Cans of beer per week    Comment: "I sit around and drink beer, that's all I got to do"   Allergies  Allergen Reactions   Amoxicillin Anaphylaxis   Tizanidine     Feet and ankle swell    Current Meds  Medication Sig   acetaminophen (TYLENOL) 500 MG tablet Take 1,000 mg by mouth every 6 (six) hours as needed for moderate pain or headache.   albuterol (VENTOLIN HFA) 108 (90 Base) MCG/ACT inhaler Inhale 2 puffs  into the lungs every 6 (six) hours as needed for wheezing or shortness of breath.   amLODipine (NORVASC) 10 MG tablet Take 1 tablet (10 mg total) by mouth daily.   aspirin EC 81 MG tablet Take 1 tablet (81 mg total) by mouth daily.   atorvastatin (LIPITOR) 40 MG tablet Take 1 tablet (40 mg total) by mouth at bedtime.   CALCIUM 600/VITAMIN D 600-10 MG-MCG TABS Take 1 tablet by mouth 2 (two) times daily.   clopidogrel (PLAVIX) 75 MG tablet Take 1 tablet (75 mg total) by mouth daily.   Dupilumab (DUPIXENT) 300 MG/2ML SOPN Inject 300 mg into the skin every 14 (fourteen) days.   Ensure Max Protein (ENSURE MAX PROTEIN) LIQD Take 330 mLs (11 oz total) by mouth 2 (two) times daily.   fluticasone-salmeterol (ADVAIR DISKUS) 250-50 MCG/ACT AEPB Inhale 1 puff into the lungs in the morning and at bedtime.   ipratropium-albuterol (DUONEB) 0.5-2.5 (3) MG/3ML SOLN INHALE 1 VIAL THROUGH NEBULIZER EVERY 6 HOURS   KLOR-CON M20 20 MEQ tablet Take 20 mEq by mouth daily.   losartan-hydrochlorothiazide (HYZAAR) 100-25 MG tablet Take 1 tablet by mouth daily.   magnesium oxide (MAG-OX) 400 (241.3 Mg) MG tablet Take 1 tablet (400 mg total) by mouth daily.   metoprolol succinate (TOPROL-XL) 50 MG 24 hr tablet Take 1 tablet (50 mg total) by mouth daily.   montelukast (SINGULAIR) 10 MG tablet Take 10 mg by mouth daily.   Multiple Vitamin (MULTIVITAMIN WITH MINERALS) TABS tablet Take 1 tablet by mouth daily.   pantoprazole (PROTONIX) 40 MG tablet Take 1 tablet (40 mg total) by mouth 2 (two) times daily.   senna-docusate (SENOKOT-S) 8.6-50 MG tablet Take 1 tablet by mouth 2 (two) times daily.   Spacer/Aero-Holding Chambers (AEROCHAMBER MV) inhaler Use as instructed   tamsulosin (FLOMAX) 0.4 MG CAPS capsule Take 1 capsule (0.4 mg total) by mouth daily after supper.   Immunization History  Administered Date(s) Administered   Influenza Inj Mdck Quad Pf 08/10/2019   Influenza Inj Mdck Quad With Preservative 07/18/2018    Influenza,inj,Quad PF,6+ Mos 10/15/2015, 08/31/2016   Influenza-Unspecified 08/10/2019, 07/19/2020, 07/19/2022   Moderna SARS-COV2 Booster Vaccination 10/23/2020   Moderna Sars-Covid-2 Vaccination 11/21/2019, 12/21/2019   Pneumococcal Polysaccharide-23 10/15/2015       Objective:   Physical Exam BP (!) 164/96 (BP Location: Left Arm, Cuff Size: Large)   Pulse 78   Temp (!) 97.1 F (36.2 C)   Ht '5\' 6"'$  (1.676 m)   Wt 203 lb (92.1 kg) Comment: per patient. in a wheelchair today  SpO2 98%   BMI 32.77 kg/m   SpO2: 98 % O2 Device: Nasal cannula O2 Flow Rate (L/min): 2 L/min  GENERAL: Chronically ill appearing gentleman, mild use of accessories, presents in transport  chair.  Wearing oxygen via nasal cannula.  No acute respiratory distress.  No plethora.  HEAD: Normocephalic, atraumatic. EYES: Pupils equal, round, reactive to light.  No scleral icterus. MOUTH: Oral mucosa moist, no thrush. NECK: Supple. No thyromegaly. Trachea midline. No JVD.  No adenopathy. PULMONARY: Increased AP diameter, significant kyphosis. Distant breath sounds. Coarse breath sounds, no wheezes.   CARDIOVASCULAR: S1 and S2. Regular rate and rhythm.  Distant heart tones no murmur appreciated. GASTROINTESTINAL: Protuberant abdomen otherwise benign. MUSCULOSKELETAL: No joint deformity, no clubbing, no edema. NEUROLOGIC: No overt focal deficit.  Speech is fluent.  Awake, alert. SKIN: Intact,warm,dry. PSYCH: In good spirits today.  Behavior appropriate.     Assessment & Plan:     ICD-10-CM   1. Asthma-COPD overlap syndrome  J44.89    Continue Advair Continue DuoNeb    2. Severe persistent asthma without complication  123XX123    Continue meds above Continue Dupixent and Singulair    3. Alpha-1-antitrypsin deficiency (HCC) MZ Phenotype  E88.01    Checking alpha levels every 6 months    4. Chronic respiratory failure with hypoxia (HCC)  J96.11    Patient compliant with oxygen Continue oxygen at 2 L/min      Will see the patient in follow-up in 3 months time he is to contact us prior to that time should any new difficulties arise.  Renold Don, MD Advanced Bronchoscopy PCCM [facility]-    *This note was dictated using voice recognition software/Dragon.  Despite best efforts to proofread, errors can occur which can change the meaning. Any transcriptional errors that result from this process are unintentional and may not be fully corrected at the time of dictation.

## 2022-12-10 NOTE — Assessment & Plan Note (Signed)
Suspected to contribute to his abdominal pain which improved after BM. Continue stool softeners as needed for regular bowel habits.

## 2022-12-12 ENCOUNTER — Encounter: Payer: Self-pay | Admitting: Pulmonary Disease

## 2022-12-23 ENCOUNTER — Ambulatory Visit: Payer: Medicare HMO | Admitting: Pulmonary Disease

## 2023-01-14 ENCOUNTER — Telehealth: Payer: Self-pay | Admitting: Pulmonary Disease

## 2023-01-14 NOTE — Telephone Encounter (Signed)
Pharmacy team, please advise. Thanks 

## 2023-01-15 NOTE — Telephone Encounter (Signed)
Left VM with [facility] phone number for him to schedle Dupixent shipment.  Knox Saliva, PharmD, MPH, BCPS, CPP Clinical Pharmacist (Rheumatology and Pulmonology)

## 2023-03-10 ENCOUNTER — Ambulatory Visit: Payer: Medicare HMO | Admitting: Pulmonary Disease

## 2023-03-25 ENCOUNTER — Ambulatory Visit (INDEPENDENT_AMBULATORY_CARE_PROVIDER_SITE_OTHER): Payer: Medicare HMO | Admitting: Adult Health

## 2023-03-25 ENCOUNTER — Encounter: Payer: Self-pay | Admitting: Adult Health

## 2023-03-25 VITALS — BP 118/80 | HR 71 | Temp 97.5°F | Ht 66.0 in | Wt 192.0 lb

## 2023-03-25 DIAGNOSIS — E8801 Alpha-1-antitrypsin deficiency: Secondary | ICD-10-CM | POA: Diagnosis not present

## 2023-03-25 DIAGNOSIS — J4489 Other specified chronic obstructive pulmonary disease: Secondary | ICD-10-CM | POA: Diagnosis not present

## 2023-03-25 DIAGNOSIS — Z148 Genetic carrier of other disease: Secondary | ICD-10-CM | POA: Diagnosis not present

## 2023-03-25 DIAGNOSIS — J9611 Chronic respiratory failure with hypoxia: Secondary | ICD-10-CM

## 2023-03-25 DIAGNOSIS — J4550 Severe persistent asthma, uncomplicated: Secondary | ICD-10-CM

## 2023-03-25 NOTE — Assessment & Plan Note (Signed)
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  Follow-up IgE had improved substantially at 126.  Clinically has been improved since starting Dupixent.  Plan  Patient Instructions  Labs today.  Refer to Lung Cancer CT chest screening program.  Continue on Advair Twice daily   Continue on Duoneb Four times a day .  Continue on Dupixent injections every 2 weeks.  Continue on Singulair daily.  Albuterol inhaler As needed   Activity as tolerated Continue on Oxygen 2l/m , goal is to keep O2 sats >88-90%.  Follow up with Dr. Jayme Cloud in 4 months and As needed

## 2023-03-25 NOTE — Assessment & Plan Note (Signed)
Known history of alpha-1 MZ phenotype previously on replacement therapy.  Follow-up levels have been in the normal range.  Will check alpha-1 level today.

## 2023-03-25 NOTE — Assessment & Plan Note (Signed)
Appears stable without exacerbation.  Refer to the lung cancer CT screening program.  Continue on current maintenance regimen.  Repeat alpha-1 test.  Plan  Patient Instructions  Labs today.  Refer to Lung Cancer CT chest screening program.  Continue on Advair Twice daily   Continue on Duoneb Four times a day .  Continue on Dupixent injections every 2 weeks.  Continue on Singulair daily.  Albuterol inhaler As needed   Activity as tolerated Continue on Oxygen 2l/m , goal is to keep O2 sats >88-90%.  Follow up with Dr. Jayme Cloud in 4 months and As needed

## 2023-03-25 NOTE — Assessment & Plan Note (Signed)
Continue on oxygen to maintain O2 saturations greater than 88 to 90% We discussed a portable oxygen concentrator patient says he does not want to use the Pulsed battery-operated devices  Plan  . Patient Instructions  Labs today.  Refer to Lung Cancer CT chest screening program.  Continue on Advair Twice daily   Continue on Duoneb Four times a day .  Continue on Dupixent injections every 2 weeks.  Continue on Singulair daily.  Albuterol inhaler As needed   Activity as tolerated Continue on Oxygen 2l/m , goal is to keep O2 sats >88-90%.  Follow up with Dr. Jayme Cloud in 4 months and As needed

## 2023-03-25 NOTE — Patient Instructions (Addendum)
Labs today.  Refer to Lung Cancer CT chest screening program.  Continue on Advair Twice daily   Continue on Duoneb Four times a day .  Continue on Dupixent injections every 2 weeks.  Continue on Singulair daily.  Albuterol inhaler As needed   Activity as tolerated Continue on Oxygen 2l/m , goal is to keep O2 sats >88-90%.  Follow up with Dr. Jayme Cloud in 4 months and As needed

## 2023-03-25 NOTE — Progress Notes (Signed)
Epworth score  @Patient  ID: Austin Patterson, male    DOB: March 19, 1956, 66 y.o.   MRN: [id number]  Chief Complaint  Patient presents with   Follow-up    Referring provider: Gildardo Pounds, PA  HPI: 67 year old male former smoker with very severe COPD with bullous emphysema and asthma,  ABPA (dx 2020)  chronic respiratory failure, alpha 1 antitrypsin phenotype MZ (normal level)  TEST/EVENTS :  CT chest February 09, 2020 severe emphysematous changes, pulmonary scarring, large right middle lobe bullae with adjacent compressive atelectasis  High-resolution CT chest January 29, 2016 severe emphysema with large bullous component in the right lung, superimposed patchy groundglass bilaterally with bronchiectasis  PFT April 20, 2019 FEV1 39%, ratio 45, FVC 64%, positive bronchodilator response, postbronchodilator FEV1 44%, DLCO 55%  Hypersensitivity pneumonitis panel December 2020 positive for A.Fumigatus #1 Abs , IgE 1950  IgE 05/2020 >126   03/25/2023 Follow up : COPD , O2 RF , Asthma, Hx of ABPA  Patient presents for a 69-month follow-up.  Patient says overall his breathing is doing about the same.  He gets short of breath with heavy activities.  But he remains very active.  He lives alone and is fully independent.  He is still doing his own yard work.  Says he does have to rest frequently.  Denies any increased cough or wheezing.  Denies any hemoptysis.  Appetite is good with no nausea vomiting or diarrhea. Remains on Advair twice daily.  Is on Dupixent injections every 2 weeks.  Uses DuoNeb 4 times a day.  Uses his rescue inhaler as needed.   Is on Singulair daily    Allergies  Allergen Reactions   Amoxicillin Anaphylaxis   Tizanidine     Feet and ankle swell     Immunization History  Administered Date(s) Administered   Influenza Inj Mdck Quad Pf 08/10/2019   Influenza Inj Mdck Quad With Preservative 07/18/2018   Influenza,inj,Quad PF,6+ Mos 10/15/2015, 08/31/2016   Influenza-Unspecified  08/10/2019, 07/19/2020, 07/19/2022   Moderna SARS-COV2 Booster Vaccination 10/23/2020   Moderna Sars-Covid-2 Vaccination 11/21/2019, 12/21/2019   Pneumococcal Polysaccharide-23 10/15/2015    Past Medical History:  Diagnosis Date   Abdominal pain 12/07/2022   Acute urinary retention 12/07/2022   COPD (chronic obstructive pulmonary disease) (HCC)    GERD (gastroesophageal reflux disease)    Hyperlipidemia    Hypertension    Pulmonary fibrosis (HCC) 11/2015    Tobacco History: Social History   Tobacco Use  Smoking Status Former   Packs/day: 2.00   Years: 50.00   Additional pack years: 0.00   Total pack years: 100.00   Types: Cigarettes   Quit date: 10/14/2015   Years since quitting: 7.4  Smokeless Tobacco Former   Counseling given: Not Answered   Outpatient Medications Prior to Visit  Medication Sig Dispense Refill   acetaminophen (TYLENOL) 500 MG tablet Take 1,000 mg by mouth every 6 (six) hours as needed for moderate pain or headache.     albuterol (VENTOLIN HFA) 108 (90 Base) MCG/ACT inhaler Inhale 2 puffs into the lungs every 6 (six) hours as needed for wheezing or shortness of breath. 8 g 11   amLODipine (NORVASC) 10 MG tablet Take 1 tablet (10 mg total) by mouth daily. 90 tablet 3   aspirin EC 81 MG tablet Take 1 tablet (81 mg total) by mouth daily. 90 tablet 3   atorvastatin (LIPITOR) 40 MG tablet Take 1 tablet (40 mg total) by mouth at bedtime. 90 tablet 3   CALCIUM  600/VITAMIN D 600-10 MG-MCG TABS Take 1 tablet by mouth 2 (two) times daily.     clopidogrel (PLAVIX) 75 MG tablet Take 1 tablet (75 mg total) by mouth daily. 90 tablet 3   Dupilumab (DUPIXENT) 300 MG/2ML SOPN Inject 300 mg into the skin every 14 (fourteen) days. 12 mL 1   Ensure Max Protein (ENSURE MAX PROTEIN) LIQD Take 330 mLs (11 oz total) by mouth 2 (two) times daily.     fluticasone-salmeterol (ADVAIR DISKUS) 250-50 MCG/ACT AEPB Inhale 1 puff into the lungs in the morning and at bedtime. 60 each 11    ipratropium-albuterol (DUONEB) 0.5-2.5 (3) MG/3ML SOLN INHALE 1 VIAL THROUGH NEBULIZER EVERY 6 HOURS 270 mL 2   KLOR-CON M20 20 MEQ tablet Take 20 mEq by mouth daily.     losartan-hydrochlorothiazide (HYZAAR) 100-25 MG tablet Take 1 tablet by mouth daily.     magnesium oxide (MAG-OX) 400 (241.3 Mg) MG tablet Take 1 tablet (400 mg total) by mouth daily. 30 tablet 0   metoprolol succinate (TOPROL-XL) 50 MG 24 hr tablet Take 1 tablet (50 mg total) by mouth daily. 90 tablet 3   montelukast (SINGULAIR) 10 MG tablet Take 10 mg by mouth daily.     Multiple Vitamin (MULTIVITAMIN WITH MINERALS) TABS tablet Take 1 tablet by mouth daily. 30 tablet 0   pantoprazole (PROTONIX) 40 MG tablet Take 1 tablet (40 mg total) by mouth 2 (two) times daily. 90 tablet 3   senna-docusate (SENOKOT-S) 8.6-50 MG tablet Take 1 tablet by mouth 2 (two) times daily. 30 tablet 1   Spacer/Aero-Holding Chambers (AEROCHAMBER MV) inhaler Use as instructed 1 each 0   tamsulosin (FLOMAX) 0.4 MG CAPS capsule Take 1 capsule (0.4 mg total) by mouth daily after supper. 30 capsule 2   EPINEPHrine 0.3 mg/0.3 mL IJ SOAJ injection Inject 0.3 mg into the muscle as needed for anaphylaxis. (Patient not taking: Reported on 07/20/2022)     No facility-administered medications prior to visit.     Review of Systems:   Constitutional:   No  weight loss, night sweats,  Fevers, chills,  +fatigue, or  lassitude.  HEENT:   No headaches,  Difficulty swallowing,  Tooth/dental problems, or  Sore throat,                No sneezing, itching, ear ache, nasal congestion, post nasal drip,   CV:  No chest pain,  Orthopnea, PND, swelling in lower extremities, anasarca, dizziness, palpitations, syncope.   GI  No heartburn, indigestion, abdominal pain, nausea, vomiting, diarrhea, change in bowel habits, loss of appetite, bloody stools.   Resp:.  No chest wall deformity  Skin: no rash or lesions.  GU: no dysuria, change in color of urine, no urgency or  frequency.  No flank pain, no hematuria   MS:  No joint pain or swelling.  No decreased range of motion.  No back pain.    Physical Exam  BP 118/80 (BP Location: Left Arm, Cuff Size: Normal)   Pulse 71   Temp (!) 97.5 F (36.4 C)   Ht 5\' 6"  (1.676 m)   Wt 192 lb (87.1 kg) Comment: per the patient. in a wheelchair today  SpO2 96%   BMI 30.99 kg/m   GEN: A/Ox3; pleasant , NAD, well nourished, elderly, oxygen, wheelchair   HEENT:  [place]/AT,  NOSE-clear, THROAT-clear, no lesions, no postnasal drip or exudate noted.   NECK:  Supple w/ fair ROM; no JVD; normal carotid impulses w/o bruits; no thyromegaly  or nodules palpated; no lymphadenopathy.    RESP decreased breath sounds in the bases  no accessory muscle use, no dullness to percussion  CARD:  RRR, no m/r/g, no peripheral edema, pulses intact, no cyanosis or clubbing.  GI:   Soft & nt; nml bowel sounds; no organomegaly or masses detected.   Musco: Warm bil, no deformities or joint swelling noted.   Neuro: alert, no focal deficits noted.    Skin: Warm, no lesions or rashes    Lab Results:    BMET     ProBNP No results found for: "PROBNP"  Imaging: No results found.       Latest Ref Rng & Units 01/29/2016    2:09 PM  PFT Results  FVC-Pre L 2.61  P  FVC-Predicted Pre % 61  P  FVC-Post L 2.62  P  FVC-Predicted Post % 61  P  Pre FEV1/FVC % % 65  P  Post FEV1/FCV % % 65  P  FEV1-Pre L 1.69  P  FEV1-Predicted Pre % 52  P  FEV1-Post L 1.70  P  DLCO uncorrected ml/min/mmHg 7.42  P  DLCO [facility]% % 26  P  DLVA Predicted % 35  P    P Preliminary result    No results found for: "NITRICOXIDE"      Assessment & Plan:   Asthma-COPD overlap syndrome Appears stable without exacerbation.  Refer to the lung cancer CT screening program.  Continue on current maintenance regimen.  Repeat alpha-1 test.  Plan  Patient Instructions  Labs today.  Refer to Lung Cancer CT chest screening program.  Continue on Advair  Twice daily   Continue on Duoneb Four times a day .  Continue on Dupixent injections every 2 weeks.  Continue on Singulair daily.  Albuterol inhaler As needed   Activity as tolerated Continue on Oxygen 2l/m , goal is to keep O2 sats >88-90%.  Follow up with Dr. Jayme Cloud in 4 months and As needed         Alpha-1-antitrypsin deficiency carrier Known history of alpha-1 MZ phenotype previously on replacement therapy.  Follow-up levels have been in the normal range.  Will check alpha-1 level today.  Chronic respiratory failure with hypoxia (HCC) Continue on oxygen to maintain O2 saturations greater than 88 to 90% We discussed a portable oxygen concentrator patient says he does not want to use the Pulsed battery-operated devices  Plan  . Patient Instructions  Labs today.  Refer to Lung Cancer CT chest screening program.  Continue on Advair Twice daily   Continue on Duoneb Four times a day .  Continue on Dupixent injections every 2 weeks.  Continue on Singulair daily.  Albuterol inhaler As needed   Activity as tolerated Continue on Oxygen 2l/m , goal is to keep O2 sats >88-90%.  Follow up with Dr. 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  Follow-up IgE had improved substantially at 126.  Clinically has been improved since starting Dupixent.  Plan  Patient Instructions  Labs today.  Refer to Lung Cancer CT chest screening program.  Continue on Advair Twice daily   Continue on Duoneb Four times a day .  Continue on Dupixent injections every 2 weeks.  Continue on Singulair daily.  Albuterol inhaler As needed   Activity as tolerated Continue on  Oxygen 2l/m , goal is to keep O2 sats >88-90%.  Follow up with Dr. Jayme Cloud in 4 months and As needed           Rubye Oaks, NP 03/25/2023

## 2023-03-31 ENCOUNTER — Other Ambulatory Visit
Admission: RE | Admit: 2023-03-31 | Discharge: 2023-03-31 | Disposition: A | Discharge status: Home / Self Care | Payer: Medicare HMO | Attending: Adult Health | Admitting: Adult Health

## 2023-03-31 DIAGNOSIS — E8801 Alpha-1-antitrypsin deficiency: Secondary | ICD-10-CM | POA: Insufficient documentation

## 2023-03-31 NOTE — Progress Notes (Signed)
Agree with the details of the visit as noted by Tammy Parrett, NP.  C. Laura Ellinor Test, MD [facility] 

## 2023-03-31 NOTE — Addendum Note (Signed)
Addended by: Yehuda Savannah on: 03/31/2023 08:48 AM   Modules accepted: Orders

## 2023-04-01 LAB — ALPHA-1-ANTITRYPSIN: A-1 Antitrypsin, Ser: 116 mg/dL (ref 101–187)

## 2023-04-03 ENCOUNTER — Encounter: Payer: Self-pay | Admitting: Internal Medicine

## 2023-04-03 ENCOUNTER — Observation Stay
Admission: EM | Admit: 2023-04-03 | Discharge: 2023-04-05 | Disposition: A | Discharge status: Home / Self Care | Payer: Medicare HMO | Attending: Osteopathic Medicine | Admitting: Osteopathic Medicine

## 2023-04-03 ENCOUNTER — Emergency Department: Payer: Medicare HMO

## 2023-04-03 ENCOUNTER — Other Ambulatory Visit: Payer: Self-pay

## 2023-04-03 DIAGNOSIS — I10 Essential (primary) hypertension: Secondary | ICD-10-CM | POA: Diagnosis not present

## 2023-04-03 DIAGNOSIS — R918 Other nonspecific abnormal finding of lung field: Secondary | ICD-10-CM | POA: Diagnosis not present

## 2023-04-03 DIAGNOSIS — R590 Localized enlarged lymph nodes: Secondary | ICD-10-CM | POA: Diagnosis not present

## 2023-04-03 DIAGNOSIS — I2510 Atherosclerotic heart disease of native coronary artery without angina pectoris: Secondary | ICD-10-CM | POA: Diagnosis present

## 2023-04-03 DIAGNOSIS — I700 Atherosclerosis of aorta: Secondary | ICD-10-CM | POA: Insufficient documentation

## 2023-04-03 DIAGNOSIS — U071 COVID-19: Secondary | ICD-10-CM | POA: Diagnosis not present

## 2023-04-03 DIAGNOSIS — R911 Solitary pulmonary nodule: Secondary | ICD-10-CM | POA: Insufficient documentation

## 2023-04-03 DIAGNOSIS — R0789 Other chest pain: Secondary | ICD-10-CM | POA: Diagnosis present

## 2023-04-03 DIAGNOSIS — Z87891 Personal history of nicotine dependence: Secondary | ICD-10-CM | POA: Diagnosis not present

## 2023-04-03 DIAGNOSIS — E871 Hypo-osmolality and hyponatremia: Secondary | ICD-10-CM | POA: Diagnosis present

## 2023-04-03 DIAGNOSIS — J441 Chronic obstructive pulmonary disease with (acute) exacerbation: Secondary | ICD-10-CM | POA: Insufficient documentation

## 2023-04-03 DIAGNOSIS — K219 Gastro-esophageal reflux disease without esophagitis: Secondary | ICD-10-CM | POA: Diagnosis present

## 2023-04-03 DIAGNOSIS — J84112 Idiopathic pulmonary fibrosis: Secondary | ICD-10-CM | POA: Insufficient documentation

## 2023-04-03 DIAGNOSIS — J8410 Pulmonary fibrosis, unspecified: Secondary | ICD-10-CM | POA: Diagnosis present

## 2023-04-03 DIAGNOSIS — Z9861 Coronary angioplasty status: Secondary | ICD-10-CM | POA: Diagnosis not present

## 2023-04-03 DIAGNOSIS — R059 Cough, unspecified: Secondary | ICD-10-CM | POA: Insufficient documentation

## 2023-04-03 DIAGNOSIS — R350 Frequency of micturition: Secondary | ICD-10-CM | POA: Insufficient documentation

## 2023-04-03 DIAGNOSIS — E8801 Alpha-1-antitrypsin deficiency: Secondary | ICD-10-CM | POA: Diagnosis not present

## 2023-04-03 DIAGNOSIS — J9611 Chronic respiratory failure with hypoxia: Secondary | ICD-10-CM | POA: Diagnosis not present

## 2023-04-03 DIAGNOSIS — E278 Other specified disorders of adrenal gland: Secondary | ICD-10-CM | POA: Diagnosis not present

## 2023-04-03 DIAGNOSIS — E785 Hyperlipidemia, unspecified: Secondary | ICD-10-CM | POA: Diagnosis present

## 2023-04-03 DIAGNOSIS — K5730 Diverticulosis of large intestine without perforation or abscess without bleeding: Secondary | ICD-10-CM | POA: Insufficient documentation

## 2023-04-03 DIAGNOSIS — Z148 Genetic carrier of other disease: Secondary | ICD-10-CM

## 2023-04-03 DIAGNOSIS — J45909 Unspecified asthma, uncomplicated: Secondary | ICD-10-CM | POA: Insufficient documentation

## 2023-04-03 DIAGNOSIS — R079 Chest pain, unspecified: Secondary | ICD-10-CM | POA: Diagnosis not present

## 2023-04-03 DIAGNOSIS — Z789 Other specified health status: Secondary | ICD-10-CM | POA: Insufficient documentation

## 2023-04-03 DIAGNOSIS — F1090 Alcohol use, unspecified, uncomplicated: Secondary | ICD-10-CM | POA: Insufficient documentation

## 2023-04-03 LAB — CBC
HCT: 32.7 % — ABNORMAL LOW (ref 39.0–52.0)
Hemoglobin: 10.5 g/dL — ABNORMAL LOW (ref 13.0–17.0)
MCH: 27.3 pg (ref 26.0–34.0)
MCHC: 32.1 g/dL (ref 30.0–36.0)
MCV: 85.2 fL (ref 80.0–100.0)
Platelets: 319 10*3/uL (ref 150–400)
RBC: 3.84 MIL/uL — ABNORMAL LOW (ref 4.22–5.81)
RDW: 15.6 % — ABNORMAL HIGH (ref 11.5–15.5)
WBC: 4.4 10*3/uL (ref 4.0–10.5)
nRBC: 0 % (ref 0.0–0.2)

## 2023-04-03 LAB — BASIC METABOLIC PANEL
Anion gap: 13 (ref 5–15)
BUN: 11 mg/dL (ref 8–23)
CO2: 26 mmol/L (ref 22–32)
Calcium: 8.7 mg/dL — ABNORMAL LOW (ref 8.9–10.3)
Chloride: 86 mmol/L — ABNORMAL LOW (ref 98–111)
Creatinine, Ser: 0.69 mg/dL (ref 0.61–1.24)
GFR, Estimated: >60 mL/min (ref >60)
Glucose, Bld: 121 mg/dL — ABNORMAL HIGH (ref 70–99)
Potassium: 3.9 mmol/L (ref 3.5–5.1)
Sodium: 125 mmol/L — ABNORMAL LOW (ref 135–145)

## 2023-04-03 LAB — RESP PANEL BY RT-PCR (FLU A&B, COVID) ARPGX2
Influenza A by PCR: NEGATIVE
Influenza B by PCR: NEGATIVE
SARS Coronavirus 2 by RT PCR: POSITIVE — AB

## 2023-04-03 LAB — TROPONIN I (HIGH SENSITIVITY)
Troponin I (High Sensitivity): 6 ng/L (ref <18)
Troponin I (High Sensitivity): 5 ng/L (ref <18)

## 2023-04-03 LAB — PROCALCITONIN: Procalcitonin: <0.1 ng/mL

## 2023-04-03 LAB — BRAIN NATRIURETIC PEPTIDE: B Natriuretic Peptide: 29 pg/mL (ref 0.0–100.0)

## 2023-04-03 MED ORDER — IPRATROPIUM-ALBUTEROL 0.5-2.5 (3) MG/3ML IN SOLN: 3.0000 mL | Freq: Four times a day (QID) | RESPIRATORY_TRACT | Status: DC | PRN

## 2023-04-03 MED ORDER — IOHEXOL 350 MG/ML SOLN
75.0000 mL | Freq: Once | INTRAVENOUS | Status: AC | PRN
  Administered 2023-04-03: 75 mL via INTRAVENOUS

## 2023-04-03 MED ORDER — METHYLPREDNISOLONE SODIUM SUCC 125 MG IJ SOLR: 125.0000 mg | INTRAMUSCULAR | Status: DC

## 2023-04-03 MED ORDER — IPRATROPIUM-ALBUTEROL 0.5-2.5 (3) MG/3ML IN SOLN
3.0000 mL | Freq: Once | RESPIRATORY_TRACT | Status: AC
  Administered 2023-04-03: 3 mL via RESPIRATORY_TRACT
  Filled 2023-04-03: qty 3

## 2023-04-03 MED ORDER — ALPRAZOLAM 0.25 MG PO TABS: 0.2500 mg | ORAL_TABLET | Freq: Three times a day (TID) | ORAL | Status: AC | PRN

## 2023-04-03 MED ORDER — ACETAMINOPHEN 325 MG PO TABS
650.0000 mg | ORAL_TABLET | Freq: Four times a day (QID) | ORAL | Status: DC | PRN
  Administered 2023-04-03 – 2023-04-04 (×2): 650 mg via ORAL
  Filled 2023-04-03 (×2): qty 2

## 2023-04-03 MED ORDER — ACETAMINOPHEN 650 MG RE SUPP: 650.0000 mg | Freq: Four times a day (QID) | RECTAL | Status: DC | PRN

## 2023-04-03 MED ORDER — ONDANSETRON HCL 4 MG/2ML IJ SOLN: 4.0000 mg | Freq: Four times a day (QID) | INTRAMUSCULAR | Status: DC | PRN

## 2023-04-03 MED ORDER — ENOXAPARIN SODIUM 40 MG/0.4ML IJ SOSY: 40.0000 mg | PREFILLED_SYRINGE | INTRAMUSCULAR | Status: DC

## 2023-04-03 MED ORDER — HEPARIN SODIUM (PORCINE) 5000 UNIT/ML IJ SOLN
5000.0000 [IU] | Freq: Three times a day (TID) | INTRAMUSCULAR | Status: DC
  Filled 2023-04-03: qty 1

## 2023-04-03 MED ORDER — LORAZEPAM 2 MG/ML IJ SOLN: 2.0000 mg | INTRAMUSCULAR | Status: DC | PRN

## 2023-04-03 MED ORDER — HYDROCOD POLI-CHLORPHE POLI ER 10-8 MG/5ML PO SUER
5.0000 mL | Freq: Every evening | ORAL | Status: DC | PRN
  Administered 2023-04-04: 5 mL via ORAL
  Filled 2023-04-03: qty 5

## 2023-04-03 MED ORDER — IPRATROPIUM-ALBUTEROL 20-100 MCG/ACT IN AERS
1.0000 | INHALATION_SPRAY | Freq: Four times a day (QID) | RESPIRATORY_TRACT | Status: DC | PRN
  Filled 2023-04-03: qty 4

## 2023-04-03 MED ORDER — ACETAMINOPHEN 325 MG PO TABS: 650.0000 mg | ORAL_TABLET | ORAL | Status: DC | PRN

## 2023-04-03 MED ORDER — MELATONIN 5 MG PO TABS: 5.0000 mg | ORAL_TABLET | Freq: Every evening | ORAL | Status: DC | PRN

## 2023-04-03 MED ORDER — GUAIFENESIN 100 MG/5ML PO LIQD: 5.0000 mL | ORAL | Status: DC | PRN

## 2023-04-03 MED ORDER — ORAL CARE MOUTH RINSE: 15.0000 mL | OROMUCOSAL | Status: DC | PRN

## 2023-04-03 NOTE — Assessment & Plan Note (Signed)
Lorazepam 2 mg IV as needed for seizure, 2 doses ordered with instruction for nursing to administer as appropriate and then let provider know for possible need of CIWA initiation

## 2023-04-03 NOTE — H&P (Signed)
History and Physical   Austin Patterson ZOX:[id number] DOB: 11/06/1955 DOA: 04/03/2023  PCP: Gildardo Pounds, PA  Outpatient Specialists: Dr. Jayme Cloud, pulmonologist Patient coming from: Home via EMS  I have personally briefly reviewed patient's old medical records in [facility] EMR.  Chief Concern: Chest short and shortness of breath, worse with exertion  HPI: 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  Vitals in the ED showed temperature of 98, respiration rate of 18, HR 79, and blood pressure 156/76, spO2 of 94% on 2 L [place].  Serum sodium 125, K 3.9, chloride 86, bicarb 26, BUN 11, sCr 0.69, nonfasting blood glucose 121, eGFR > 60, wbc 4.4, hgb 10.5, platelets 319.  ED treatment: DuoNebs 3 treatments given, Solu-Medrol 125 mg IV one-time dose. ------------------------------------ At bedside, patient was able to tell me his name, age, location, current calendar year.  He reports that over the last 1 to 2 months, he has been having worsening chest pain and shortness of breath with weakness especially on exertion.  He reports that he does have baseline shortness of breath however over the last 2 months it has worsened.  He endorses cough with productive sputum.  He reports that in the last few days, he he has been experiencing difficulty ambulating on the baseline 2 L nasal cannula.   He reports the pressure is bilateral upper abdominal band especially with exertion.  He denies associated upper extremity, neck discomfort.  He denies any known sick contacts,  trauma to his person, fever, chills, productive cough, dysuria, hematuria, diarrhea.  Social history: He lives at home. He is a former tobacco user quitting in 2016.  At his peak he was smoking 2 packs/day and stopped smoking when he was told he had idiopathic pulmonary fibrosis. He endorses regular EtOH use though he does not drink every day. His last drink was evening of 04/01/2022, drinking 6 cans of 12 ounce beers last night. He denies recreational drug use.  ROS: Constitutional: no weight change, no fever ENT/Mouth: no sore throat, no rhinorrhea Eyes: no eye pain, no vision changes Cardiovascular: + chest pain, + dyspnea,  no edema, no palpitations Respiratory: + cough, + sputum, no wheezing Gastrointestinal: no nausea, no vomiting, no diarrhea, no constipation Genitourinary: no urinary incontinence, no dysuria, no hematuria Musculoskeletal: no arthralgias, no myalgias Skin: no skin lesions, no pruritus, Neuro: + weakness, no loss of consciousness, no syncope Psych: no anxiety, no depression, + decrease appetite Heme/Lymph: no bruising, no bleeding  ED Course: Discussed with emergency medicine provider, patient requiring hospitalization for chief concerns of shortness of breath, chest pain, suspect secondary to new right lower lobe lung mass.  Assessment/Plan  Principal Problem:   Chest pain Active Problems:   CAD (coronary artery disease)   Hyponatremia   IPF (idiopathic pulmonary fibrosis) (HCC)   Essential hypertension   Hyperlipidemia   Chronic respiratory failure with hypoxia (HCC)   Alpha-1-antitrypsin deficiency carrier   GERD without esophagitis   Right lower lobe lung mass   Cough   Alcohol use   Assessment and Plan:  * Chest pain With chest pressure worse with exertion NM myocardial multi stress  evaluation in 07/27/2022 was read as no ishemia, low risk study I believe the chest pain/pressure is not cardiac related, however given patient's age, history of cad s/p  PCI in 2009/2010, I ordered a complete echo If echo read is positive for cardiac changes, AM to consult cardiology as appropriate I suspect the chest pain and pressure is relate new large (and likely aggressive) mass noted on CT in the right lower lobe  Alcohol use Lorazepam 2 mg IV as needed for seizure, 2 doses ordered with instruction for nursing to administer as appropriate and then let provider know for possible need of CIWA initiation  Cough Presumed multifactorial in setting of idiopathic pulmonary fibrosis with chronic COPD requiring chronic 2 L nasal cannula and new right lower lobe lung nodule Symptomatic support: Guaifenesin 5 mL, p.o., every 6 hours as needed to loosen phlegm, 2 days ordered; Tussionex 5 mL oral nightly as needed for cough, 2 days ordered  Right lower lobe lung mass CT lung mass biopsy, routine, ordered on admission for the new 6.8 cm mass lesion in the anterior right midlung field with CT read suggesting possible malignant neoplasm with possible weight of 2 small nodule densities in the right lower lung field suggesting metastatic disease  Alpha-1-antitrypsin deficiency carrier Continue outpatient follow-up with pulmonologist  Chronic respiratory failure with hypoxia (HCC) Patient is on chronic 2 L nasal cannula, and currently at baseline on admission  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  Chart reviewed.   DVT prophylaxis: Heparin 5000 units subcutaneous every 8 hours, 1 doses ordered for 1400 on admission day; AM team to reinitiate pharmacologic DVT prophylaxis when the benefits outweigh the risk Code Status: Full code Diet: Heart healthy now Family Communication: A phone call was offered, patient declined Disposition Plan: Pending clinical course Consults called: Interventional radiology via CT lung mass biopsy order placed on admission Admission status:  Telemetry cardiac, observation  Past Medical History:  Diagnosis Date   Abdominal pain 12/07/2022   Acute urinary retention 12/07/2022   COPD (chronic obstructive pulmonary disease) (HCC)    GERD (gastroesophageal reflux disease)    Hyperlipidemia    Hypertension    Pulmonary fibrosis (HCC) 11/2015   Past Surgical History:  Procedure Laterality Date   COLONOSCOPY     CORONARY STENT PLACEMENT     ESOPHAGOGASTRODUODENOSCOPY (EGD) WITH PROPOFOL N/A 09/23/2016   Procedure: ESOPHAGOGASTRODUODENOSCOPY (EGD) WITH PROPOFOL;  Surgeon: Wyline Mood, MD;  Location: [facility] ENDOSCOPY;  Service: Endoscopy;  Laterality: N/A;   KYPHOPLASTY N/A 03/14/2020   Procedure: T7 & T11 KYPHOPLASTY;  Surgeon: Kennedy Bucker, MD;  Location: [facility] ORS;  Service: Orthopedics;  Laterality: N/A;   SHOULDER ACROMIOPLASTY     Social History:  reports that he quit smoking about 7 years ago. His smoking use included cigarettes. He has a 100.00 pack-year smoking history. He has quit using smokeless tobacco. He reports current alcohol use of about 56.0 standard drinks of alcohol per week. He reports that he does not use drugs.  Allergies  Allergen Reactions   Amoxicillin Anaphylaxis   Tizanidine     Feet and ankle swell    Family History  Problem Relation Age of Onset   Heart disease Mother    Family history: Family history reviewed and not pertinent.  Prior to Admission medications   Medication Sig Start Date End Date Taking? Authorizing Provider  acetaminophen (TYLENOL) 500 MG tablet Take 1,000 mg by  mouth every 6 (six) hours as needed for moderate pain or headache.    [provider]  albuterol (VENTOLIN HFA) 108 (90 Base) MCG/ACT inhaler Inhale 2 puffs into the lungs every 6 (six) hours as needed for wheezing or shortness of breath. 01/29/20   Danford, Earl Lites, MD  amLODipine (NORVASC) 10 MG tablet Take 1 tablet (10 mg total) by mouth daily. 08/19/16   Virl Axe, MD  aspirin EC 81 MG tablet Take 1  tablet (81 mg total) by mouth daily. 08/19/16   Virl Axe, MD  atorvastatin (LIPITOR) 40 MG tablet Take 1 tablet (40 mg total) by mouth at bedtime. 08/19/16   Virl Axe, MD  CALCIUM 600/VITAMIN D 600-10 MG-MCG TABS Take 1 tablet by mouth 2 (two) times daily. 11/20/21   [provider]  clopidogrel (PLAVIX) 75 MG tablet Take 1 tablet (75 mg total) by mouth daily. 08/19/16   Virl Axe, MD  Dupilumab (DUPIXENT) 300 MG/2ML SOPN Inject 300 mg into the skin every 14 (fourteen) days. 11/04/22   Salena Saner, MD  Ensure Max Protein (ENSURE MAX PROTEIN) LIQD Take 330 mLs (11 oz total) by mouth 2 (two) times daily. 12/09/22   Esaw Grandchild A, DO  EPINEPHrine 0.3 mg/0.3 mL IJ SOAJ injection Inject 0.3 mg into the muscle as needed for anaphylaxis. Patient not taking: Reported on 07/20/2022    [provider]  fluticasone-salmeterol (ADVAIR DISKUS) 250-50 MCG/ACT AEPB Inhale 1 puff into the lungs in the morning and at bedtime. 03/04/22   Salena Saner, MD  ipratropium-albuterol (DUONEB) 0.5-2.5 (3) MG/3ML SOLN INHALE 1 VIAL THROUGH NEBULIZER EVERY 6 HOURS 10/01/22   Salena Saner, MD  KLOR-CON M20 20 MEQ tablet Take 20 mEq by mouth daily. 11/20/21   [provider]  losartan-hydrochlorothiazide (HYZAAR) 100-25 MG tablet Take 1 tablet by mouth daily. 11/19/21   [provider]  magnesium oxide (MAG-OX) 400 (241.3 Mg) MG tablet Take 1 tablet (400 mg total) by mouth daily. 06/18/17   Milagros Loll, MD  metoprolol succinate (TOPROL-XL) 50 MG 24 hr tablet Take 1 tablet (50 mg total) by mouth daily. 08/19/16   Virl Axe, MD  montelukast (SINGULAIR) 10 MG tablet Take 10 mg by mouth daily. 11/19/21   [provider]  Multiple Vitamin (MULTIVITAMIN WITH MINERALS) TABS tablet Take 1 tablet by mouth daily. 09/24/16   Enedina Finner, MD  pantoprazole (PROTONIX) 40 MG tablet Take 1 tablet (40 mg total) by mouth 2 (two) times daily. 09/23/16   Enedina Finner, MD   senna-docusate (SENOKOT-S) 8.6-50 MG tablet Take 1 tablet by mouth 2 (two) times daily. 12/09/22   Pennie Banter, DO  Spacer/Aero-Holding Chambers (AEROCHAMBER MV) inhaler Use as instructed 10/05/19   Salena Saner, MD  tamsulosin (FLOMAX) 0.4 MG CAPS capsule Take 1 capsule (0.4 mg total) by mouth daily after supper. 12/09/22   Pennie Banter, DO   Physical Exam: Vitals:   04/03/23 1630 04/03/23 1700 04/03/23 1730 04/03/23 1800  BP: (!) 145/86 131/68 (!) 140/77 (!) 148/80  Pulse: 94 90 90 97  Resp: (!) 22 20 19 20   Temp:      SpO2: 99% 97% 99% 94%  Weight:      Height:       Constitutional: appears older than chronological age, frail, NAD, calm Eyes: PERRL, lids and conjunctivae normal ENMT: Mucous membranes are moist. Posterior pharynx clear of any exudate or lesions. Age-appropriate dentition. Hearing appropriate Neck: normal,  supple, no masses, no thyromegaly Respiratory: clear to auscultation bilaterally, no wheezing, no crackles. Normal respiratory effort. No accessory muscle use.  Cardiovascular: Regular rate and rhythm, no murmurs / rubs / gallops. No extremity edema. 2+ pedal pulses. No carotid bruits.  Abdomen: Obese abdomen, no tenderness, no masses palpated, no hepatosplenomegaly. Bowel sounds positive.  Musculoskeletal: no clubbing / cyanosis. No joint deformity upper and lower extremities. Good ROM, no contractures, no atrophy. Normal muscle tone.  Skin: no rashes, lesions, ulcers. No induration Neurologic: Sensation intact. Strength 5/5 in all 4.  Psychiatric: Normal judgment and insight. Alert and oriented x 3. Normal mood.   EKG: independently reviewed, showing sinus rhythm with rate of 77, QTc 459  Chest x-ray on Admission: I personally reviewed and I agree with radiologist reading as below.  CT Angio Chest PE W and/or Wo Contrast  Result Date: 04/03/2023 CLINICAL DATA:  High clinical suspicion for PE EXAM: CT ANGIOGRAPHY CHEST WITH CONTRAST TECHNIQUE:  Multidetector CT imaging of the chest was performed using the standard protocol during bolus administration of intravenous contrast. Multiplanar CT image reconstructions and MIPs were obtained to evaluate the vascular anatomy. RADIATION DOSE REDUCTION: This exam was performed according to the departmental dose-optimization program which includes automated exposure control, adjustment of the mA and/or kV according to patient size and/or use of iterative reconstruction technique. CONTRAST:  75mL OMNIPAQUE IOHEXOL 350 MG/ML SOLN COMPARISON:  02/09/2020 FINDINGS: Cardiovascular: There is homogeneous enhancement in thoracic aorta. There are scattered atherosclerotic plaques and calcifications in thoracic aorta and its major branches. There are no intraluminal filling defects in pulmonary artery branches. Coronary artery calcifications are seen. Mediastinum/Nodes: There are enlarged lymph nodes in mediastinum measuring up to 2.1 x 1.9 cm in precarinal region. There is enlarged lymph node in right hilum measuring 2.8 x 1.8 cm. Lungs/Pleura: There is a new 6.8 x 4.9 cm soft tissue mass in the anterior right mid lung field. In image 108 of series 5, there is 10 mm nodule in right lower lung field. In image 114, there is 1.8 cm nodule in right lower lung field. Large bulla is seen in lateral aspect of right lung. Centrilobular and panlobular emphysema is seen. There is no pleural effusion or pneumothorax. Upper Abdomen: There is 2.1 cm nodule in right adrenal. There is 1.8 cm nodule in left adrenal. Density measurements are less than 0 Hounsfield units suggesting adenomas. Musculoskeletal: There is interval vertebroplasty in the bodies of T7 and T11 vertebrae. Old compression fractures are seen in T7 and T11 vertebrae. Review of the MIP images confirms the above findings. IMPRESSION: There is no evidence of pulmonary artery embolism. There is no evidence of thoracic aortic dissection. Aortic atherosclerosis. Coronary artery  calcifications are seen. There is new 6.8 cm mass lesion in the anterior right midlung field suggesting possible malignant neoplasm. There are 2 small nodular densities in right lower lung field suggesting possible metastatic disease. There are abnormally enlarged lymph nodes in right hilum and mediastinum suggesting possible metastatic lymphadenopathy. Follow-up PET-CT and tissue sampling as clinically warranted should be considered. There are low-density nodules in both adrenals suggesting adenomas. Old compression fractures are seen in bodies of T7 and T11 vertebrae with interval vertebroplasty. Electronically Signed   By: Ernie Avena M.D.   On: 04/03/2023 14:30   CT Renal Stone Study  Result Date: 04/03/2023 CLINICAL DATA:  Bladder-neck obstruction frequent, small volume urination for several days EXAM: CT ABDOMEN AND PELVIS WITHOUT CONTRAST TECHNIQUE: Multidetector CT imaging of the abdomen and  pelvis was performed following the standard protocol without IV contrast. RADIATION DOSE REDUCTION: This exam was performed according to the departmental dose-optimization program which includes automated exposure control, adjustment of the mA and/or kV according to patient size and/or use of iterative reconstruction technique. COMPARISON:  12/07/2022 FINDINGS: Lower chest: Advanced emphysematous changes within the lung bases. Two enlarging right lower lobe pulmonary nodules (series 4, images 10 and 19). See dedicated CT chest for full detail. Hepatobiliary: Unremarkable unenhanced appearance of the liver. No focal liver lesion identified. Gallbladder within normal limits. No hyperdense gallstone. No biliary dilatation. Pancreas: Unremarkable. No pancreatic ductal dilatation or surrounding inflammatory changes. Spleen: Normal in size without focal abnormality. Adrenals/Urinary Tract: Stable bilateral benign adrenal adenomas which do not require follow-up imaging. Cortical scarring of the lower pole of the  right kidney. Atherosclerotic calcification within the bilateral renal hila. Kidneys appear otherwise within normal limits. No renal stone or hydronephrosis. Urinary bladder is within normal limits. Stomach/Bowel: Stomach is within normal limits. Scattered colonic diverticulosis. No evidence of bowel wall thickening, distention, or inflammatory changes. Vascular/Lymphatic: Advanced aortoiliac atherosclerotic calcification. No aneurysm. No abdominopelvic lymphadenopathy. Reproductive: Prostate is unremarkable. Other: No free fluid. No abdominopelvic fluid collection. No pneumoperitoneum. Small fat containing umbilical hernia. Musculoskeletal: No acute or significant osseous findings. IMPRESSION: 1. No acute abdominopelvic findings. No evidence of obstructive uropathy. 2. Two enlarging right lower lobe pulmonary nodules, suspicious for malignancy. See dedicated CT chest for full detail. 3. Colonic diverticulosis without evidence of acute diverticulitis. 4. Aortic atherosclerosis (ICD10-I70.0). Electronically Signed   By: Duanne Guess D.O.   On: 04/03/2023 14:27   DG Chest 2 View  Result Date: 04/03/2023 CLINICAL DATA:  Shortness of breath EXAM: CHEST - 2 VIEW COMPARISON:  X-ray 12/07/2022 FINDINGS: Rounded masslike area in the right lung base. This would have a differential including aggressive lesion. Recommend follow up chest CT to further delineate. Left midlung scar or atelectasis. No pneumothorax or separate consolidation. No joint effusion. Normal cardiopericardial silhouette. Degenerative changes of the spine with the areas of compression and augmentation cement, similar to previous IMPRESSION: Developing rounded masslike opacity at the right lung base measuring 7 cm. An aggressive lesion is possible and recommend follow-up contrast chest CT when appropriate Electronically Signed   By: Karen Kays M.D.   On: 04/03/2023 12:11    Labs on Admission: I have personally reviewed following  labs  CBC: Recent Labs  Lab 04/03/23 1148  WBC 4.4  HGB 10.5*  HCT 32.7*  MCV 85.2  PLT 319   Basic Metabolic Panel: Recent Labs  Lab 04/03/23 1148  NA 125*  K 3.9  CL 86*  CO2 26  GLUCOSE 121*  BUN 11  CREATININE 0.69  CALCIUM 8.7*   GFR: Estimated Creatinine Clearance: 92.6 mL/min (by C-G formula based on SCr of 0.69 mg/dL).  Urine analysis:    Component Value Date/Time   COLORURINE COLORLESS (A) 12/07/2022 1235   APPEARANCEUR CLEAR (A) 12/07/2022 1235   LABSPEC 1.005 12/07/2022 1235   PHURINE 7.0 12/07/2022 1235   GLUCOSEU NEGATIVE 12/07/2022 1235   HGBUR NEGATIVE 12/07/2022 1235   BILIRUBINUR NEGATIVE 12/07/2022 1235   KETONESUR NEGATIVE 12/07/2022 1235   PROTEINUR NEGATIVE 12/07/2022 1235   NITRITE NEGATIVE 12/07/2022 1235   LEUKOCYTESUR NEGATIVE 12/07/2022 1235   This document was prepared using Dragon Voice Recognition software and may include unintentional dictation errors.  Dr. Sedalia Muta [facility]  If 7PM-7AM, please contact overnight-coverage provider If 7AM-7PM, please contact day attending provider [email or web address]  04/03/2023,  7:18 PM

## 2023-04-03 NOTE — ED Triage Notes (Signed)
Pt bib EMS from for pleuritic chest pain. Hx of pulmonary fibrosis. Always 2lnc at home. Pt denies any relief from duoneb   From EMS: Duoneb given for sats 92% and bilateral rhonchi 130/80 131 20g R hand

## 2023-04-03 NOTE — Assessment & Plan Note (Signed)
Continue outpatient follow-up with pulmonologist

## 2023-04-03 NOTE — Assessment & Plan Note (Signed)
With chest pressure worse with exertion NM myocardial multi stress evaluation in 07/27/2022 was read as no ishemia, low risk study I believe the chest pain/pressure is not cardiac related, however given patient's age, history of cad s/p PCI in 2009/2010, I ordered a complete echo If echo read is positive for cardiac changes, AM to consult cardiology as appropriate I suspect the chest pain and pressure is relate new large (and likely aggressive) mass noted on CT in the right lower lobe

## 2023-04-03 NOTE — Assessment & Plan Note (Addendum)
CT lung mass biopsy, routine, ordered on admission for the new 6.8 cm mass lesion in the anterior right midlung field with CT read suggesting possible malignant neoplasm with possible weight of 2 small nodule densities in the right lower lung field suggesting metastatic disease

## 2023-04-03 NOTE — Hospital Course (Addendum)
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 06/15: admitted for chest pain / COPD. Chest pain felt likely d/t chest mass RLL lung, bx pending.  06/16: Echo no concerns (cardiology consult deferred). RLL mass bx pending d/t no IR availability on the weekend    Consultants:  IR  Procedures: none      ASSESSMENT & PLAN:   Principal Problem:   Chest pain Active Problems:   CAD (coronary artery disease)   Hyponatremia   IPF (idiopathic pulmonary fibrosis) (HCC)   Essential hypertension   Hyperlipidemia   Chronic respiratory failure with hypoxia (HCC)   Alpha-1-antitrypsin deficiency carrier   GERD without esophagitis   Right lower lobe lung mass   Cough   Alcohol use   Right lower lobe lung mass  Large bulla adjacent to mass  CT lung mass biopsy, routine, ordered on admission for the new 6.8 cm mass lesion in the anterior right midlung field with CT read suggesting possible malignant neoplasm with possible weight of 2 small nodule densities in the right lower lung field suggesting metastatic disease Biopsy pending - felt keeping inpatient for this d/t adjacent bulla giving risk for PTX  Chest pain likely d/t lung mass  With chest pressure worse with exertion NM myocardial multi stress evaluation in 07/27/2022 was read as no ishemia, low risk study I believe the chest pain/pressure is not cardiac related, however given patient's age, history of cad s/p PCI in 2009/2010,Echo ordered which showed no concerns Follow cardiology outpatient    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, baseline serum sodium level over the past 3 months has been 126-133 Recheck BMP   Chronic respiratory failure with hypoxia (HCC) Patient is on chronic 2 L nasal cannula, and currently at baseline on admission Continue O2 as needed  Alpha-1-antitrypsin deficiency carrier Continue outpatient follow-up with pulmonologist  Cough Presumed multifactorial in setting of idiopathic pulmonary fibrosis with chronic COPD requiring chronic 2 L nasal cannula and new right lower lobe lung nodule Symptomatic support: Guaifenesin 5 mL, p.o., every 6 hours as needed to loosen phlegm, 2 days ordered; Tussionex 5 mL oral nightly as needed for cough, 2 days ordered  Alcohol use Lorazepam 2 mg IV as needed for seizure, 2 doses ordered with instruction for nursing to administer as appropriate and then let provider know for possible need of CIWA initiation    DVT prophylaxis: SCD Pertinent IV fluids/nutrition: no conitnuous fluids  Central lines / invasive devices: none  Code Status: FULL CODE ACP documentation reviewed: none on file  Current Admission Status: observation  TOC needs / Dispo plan: TBD Barriers to discharge / significant pending items: biopsy

## 2023-04-03 NOTE — Assessment & Plan Note (Signed)
Presumed multifactorial in setting of idiopathic pulmonary fibrosis with chronic COPD requiring chronic 2 L nasal cannula and new right lower lobe lung nodule Symptomatic support: Guaifenesin 5 mL, p.o., every 6 hours as needed to loosen phlegm, 2 days ordered; Tussionex 5 mL oral nightly as needed for cough, 2 days ordered

## 2023-04-03 NOTE — ED Provider Notes (Signed)
[facility] Provider Note    Event Date/Time   First MD Initiated Contact with Patient 04/03/23 1310     (approximate)   History   Chest Pain   HPI  Austin Patterson is a 67 y.o. male history of pulmonary fibrosis, previous admission to the hospital reviewed by me for COPD exacerbation, hyponatremia, urinary retention, coronary disease  Patient reports first few days now he has been experiencing a feeling of chest tightness.  He is familiar with this feeling it tends to accompany problems with his lungs.  He has been coughing more but nonproductive yesterday.  It is not particularly painful just feels like a tightening and difficulty breathing.  No fevers or chills.  He has noticed he feels a little bit swollen in his legs as well.  But does chronically retain some fluid around the ankles and has neuropathy as well  Patient reports that EMS gave him IV steroid today  He has also noticed for several days that when he urinates he is urinating somewhat frequently and often.  He does not feel like his bladder is full.  No pain with urination.   Physical Exam   Triage Vital Signs: ED Triage Vitals  Enc Vitals Group     BP 04/03/23 1147 (!) 156/76     Pulse Rate 04/03/23 1147 79     Resp 04/03/23 1147 18     Temp 04/03/23 1147 98 F (36.7 C)     Temp src --      SpO2 04/03/23 1147 94 %     Weight 04/03/23 1144 191 lb 12.8 oz (87 kg)     Height 04/03/23 1144 5\' 6"  (1.676 m)     Head Circumference --      Peak Flow --      Pain Score --      Pain Loc --      Pain Edu? --      Excl. in GC? --     Most recent vital signs: Vitals:   04/03/23 1147  BP: (!) 156/76  Pulse: 79  Resp: 18  Temp: 98 F (36.7 C)  SpO2: 94%     General: Awake, no distress SIPC appears to slightly tachypneic and has a somewhat rhonchorous cough. CV:  Good peripheral perfusion.  Normal tones and rate Resp:  Mildly increased work of breathing including mild accessory  muscle use, lung sounds are fairly diminished throughout, there is some scant wheezing noted throughout.  Slightly diminished throughout the right lower lung base as well.  Central rhonchi with coughing Abd:  No distention.  Minus distention.  Soft nontender all quadrants Other:     ED Results / Procedures / Treatments   Labs (all labs ordered are listed, but only abnormal results are displayed) Labs Reviewed  BASIC METABOLIC PANEL - Abnormal; Notable for the following components:      Result Value   Sodium 125 (*)    Chloride 86 (*)    Glucose, Bld 121 (*)    Calcium 8.7 (*)    All other components within normal limits  CBC - Abnormal; Notable for the following components:   RBC 3.84 (*)    Hemoglobin 10.5 (*)    HCT 32.7 (*)    RDW 15.6 (*)    All other components within normal limits  RESP PANEL BY RT-PCR (FLU A&B, COVID) ARPGX2  RESPIRATORY PANEL BY PCR  BRAIN NATRIURETIC PEPTIDE  PROCALCITONIN  TROPONIN I (HIGH SENSITIVITY)  TROPONIN I (HIGH SENSITIVITY)     EKG  Interpreted by me 45 heart rate 80 QRS 80 QTc 469 right branch block.  No acute ischemia.   RADIOLOGY Chest x-ray interpreted by me is concerning for masslike structure in the right lower chest/lung.  CT Angio Chest PE W and/or Wo Contrast  Result Date: 04/03/2023 CLINICAL DATA:  High clinical suspicion for PE EXAM: CT ANGIOGRAPHY CHEST WITH CONTRAST TECHNIQUE: Multidetector CT imaging of the chest was performed using the standard protocol during bolus administration of intravenous contrast. Multiplanar CT image reconstructions and MIPs were obtained to evaluate the vascular anatomy. RADIATION DOSE REDUCTION: This exam was performed according to the departmental dose-optimization program which includes automated exposure control, adjustment of the mA and/or kV according to patient size and/or use of iterative reconstruction technique. CONTRAST:  75mL OMNIPAQUE IOHEXOL 350 MG/ML SOLN COMPARISON:  02/09/2020  FINDINGS: Cardiovascular: There is homogeneous enhancement in thoracic aorta. There are scattered atherosclerotic plaques and calcifications in thoracic aorta and its major branches. There are no intraluminal filling defects in pulmonary artery branches. Coronary artery calcifications are seen. Mediastinum/Nodes: There are enlarged lymph nodes in mediastinum measuring up to 2.1 x 1.9 cm in precarinal region. There is enlarged lymph node in right hilum measuring 2.8 x 1.8 cm. Lungs/Pleura: There is a new 6.8 x 4.9 cm soft tissue mass in the anterior right mid lung field. In image 108 of series 5, there is 10 mm nodule in right lower lung field. In image 114, there is 1.8 cm nodule in right lower lung field. Large bulla is seen in lateral aspect of right lung. Centrilobular and panlobular emphysema is seen. There is no pleural effusion or pneumothorax. Upper Abdomen: There is 2.1 cm nodule in right adrenal. There is 1.8 cm nodule in left adrenal. Density measurements are less than 0 Hounsfield units suggesting adenomas. Musculoskeletal: There is interval vertebroplasty in the bodies of T7 and T11 vertebrae. Old compression fractures are seen in T7 and T11 vertebrae. Review of the MIP images confirms the above findings. IMPRESSION: There is no evidence of pulmonary artery embolism. There is no evidence of thoracic aortic dissection. Aortic atherosclerosis. Coronary artery calcifications are seen. There is new 6.8 cm mass lesion in the anterior right midlung field suggesting possible malignant neoplasm. There are 2 small nodular densities in right lower lung field suggesting possible metastatic disease. There are abnormally enlarged lymph nodes in right hilum and mediastinum suggesting possible metastatic lymphadenopathy. Follow-up PET-CT and tissue sampling as clinically warranted should be considered. There are low-density nodules in both adrenals suggesting adenomas. Old compression fractures are seen in bodies of T7  and T11 vertebrae with interval vertebroplasty. Electronically Signed   By: Ernie Avena M.D.   On: 04/03/2023 14:30   CT Renal Stone Study  Result Date: 04/03/2023 CLINICAL DATA:  Bladder-neck obstruction frequent, small volume urination for several days EXAM: CT ABDOMEN AND PELVIS WITHOUT CONTRAST TECHNIQUE: Multidetector CT imaging of the abdomen and pelvis was performed following the standard protocol without IV contrast. RADIATION DOSE REDUCTION: This exam was performed according to the departmental dose-optimization program which includes automated exposure control, adjustment of the mA and/or kV according to patient size and/or use of iterative reconstruction technique. COMPARISON:  12/07/2022 FINDINGS: Lower chest: Advanced emphysematous changes within the lung bases. Two enlarging right lower lobe pulmonary nodules (series 4, images 10 and 19). See dedicated CT chest for full detail. Hepatobiliary: Unremarkable unenhanced appearance of the liver. No focal liver lesion identified. Gallbladder within  normal limits. No hyperdense gallstone. No biliary dilatation. Pancreas: Unremarkable. No pancreatic ductal dilatation or surrounding inflammatory changes. Spleen: Normal in size without focal abnormality. Adrenals/Urinary Tract: Stable bilateral benign adrenal adenomas which do not require follow-up imaging. Cortical scarring of the lower pole of the right kidney. Atherosclerotic calcification within the bilateral renal hila. Kidneys appear otherwise within normal limits. No renal stone or hydronephrosis. Urinary bladder is within normal limits. Stomach/Bowel: Stomach is within normal limits. Scattered colonic diverticulosis. No evidence of bowel wall thickening, distention, or inflammatory changes. Vascular/Lymphatic: Advanced aortoiliac atherosclerotic calcification. No aneurysm. No abdominopelvic lymphadenopathy. Reproductive: Prostate is unremarkable. Other: No free fluid. No abdominopelvic fluid  collection. No pneumoperitoneum. Small fat containing umbilical hernia. Musculoskeletal: No acute or significant osseous findings. IMPRESSION: 1. No acute abdominopelvic findings. No evidence of obstructive uropathy. 2. Two enlarging right lower lobe pulmonary nodules, suspicious for malignancy. See dedicated CT chest for full detail. 3. Colonic diverticulosis without evidence of acute diverticulitis. 4. Aortic atherosclerosis (ICD10-I70.0). Electronically Signed   By: Duanne Guess D.O.   On: 04/03/2023 14:27     PROCEDURES:  Critical Care performed: No  Procedures   MEDICATIONS ORDERED IN ED: Medications  enoxaparin (LOVENOX) injection 40 mg (has no administration in time range)  ALPRAZolam (XANAX) tablet 0.25 mg (has no administration in time range)  ipratropium-albuterol (DUONEB) 0.5-2.5 (3) MG/3ML nebulizer solution 3 mL (has no administration in time range)  acetaminophen (TYLENOL) tablet 650 mg (has no administration in time range)    Or  acetaminophen (TYLENOL) suppository 650 mg (has no administration in time range)  ondansetron (ZOFRAN) injection 4 mg (has no administration in time range)  LORazepam (ATIVAN) injection 2 mg (has no administration in time range)  ipratropium-albuterol (DUONEB) 0.5-2.5 (3) MG/3ML nebulizer solution 3 mL (3 mLs Nebulization Given 04/03/23 1350)  ipratropium-albuterol (DUONEB) 0.5-2.5 (3) MG/3ML nebulizer solution 3 mL (3 mLs Nebulization Given 04/03/23 1349)  iohexol (OMNIPAQUE) 350 MG/ML injection 75 mL (75 mLs Intravenous Contrast Given 04/03/23 1353)  ipratropium-albuterol (DUONEB) 0.5-2.5 (3) MG/3ML nebulizer solution 3 mL (3 mLs Nebulization Given 04/03/23 1513)     IMPRESSION / MDM / ASSESSMENT AND PLAN / ED COURSE  I reviewed the triage vital signs and the nursing notes.                              Differential diagnosis includes, but is not limited to, likely exacerbation of underlying COPD and pulmonary fibrosis.  Other considerations  would be less likely ACS, PE, pneumothorax, viral process, etc.  Denies acute abdominal symptoms but does report increased urinary frequency and carries a history of previous urinary retention.  He does not have any obvious bladder distention on exam or abdominal pain on examination.  Check urinalysis, also obtain CT renal to evaluate for possible obstructive or renal pathology, and discussed with the patient seems this is more chronic and his reason for presentation to the ED seems more secondary to his difficulty breathing and productive cough. No fever. No elev WBC argue against pneumonia.  Patient's presentation is most consistent with acute complicated illness / injury requiring diagnostic workup.   The patient is on the cardiac monitor to evaluate for evidence of arrhythmia and/or significant heart rate changes.  ----------------------------------------- 3:47 PM on 04/03/2023 ----------------------------------------- Patient accepted in admission after consultation with Dr. Sedalia Muta.  Patient understanding and agreeable with plan for admission.  In short after receiving nebulizer treatments here and steroid with EMS he  continues to feel dyspnea and has mild accessory muscle use.  He does not have a new oxygen deficit but he does have findings concerning for possible malignancy needing further workup in his right lung as well as evidence of ongoing COPD type exacerbation or exacerbation of pulmonary fibrosis.  I am concerned that he has very little pulmonary reserve capacity.  Given he does not have associated improvement after treatment given I think he would benefit from further treatment here in the hospital and patient is in agreement.  I discussed with the patient that though I cannot definitively say he has lung cancer it is certainly extremely suspicious and he is understanding of this.  Patient agreeable with admission     FINAL CLINICAL IMPRESSION(S) / ED DIAGNOSES   Final diagnoses:   COPD with acute exacerbation (HCC)  Lung mass  Urinary frequency     Rx / DC Orders   ED Discharge Orders     None        Note:  This document was prepared using Dragon voice recognition software and may include unintentional dictation errors.   Sharyn Creamer, MD 04/03/23 1549

## 2023-04-03 NOTE — Assessment & Plan Note (Addendum)
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

## 2023-04-03 NOTE — Assessment & Plan Note (Addendum)
Patient is on chronic 2 L nasal cannula, and currently at baseline on admission

## 2023-04-04 ENCOUNTER — Observation Stay (HOSPITAL_BASED_OUTPATIENT_CLINIC_OR_DEPARTMENT_OTHER)
Admit: 2023-04-04 | Discharge: 2023-04-04 | Disposition: A | Discharge status: Home / Self Care | Payer: Medicare HMO | Attending: Internal Medicine | Admitting: Internal Medicine

## 2023-04-04 DIAGNOSIS — R0609 Other forms of dyspnea: Secondary | ICD-10-CM | POA: Diagnosis not present

## 2023-04-04 DIAGNOSIS — R918 Other nonspecific abnormal finding of lung field: Secondary | ICD-10-CM

## 2023-04-04 DIAGNOSIS — R079 Chest pain, unspecified: Secondary | ICD-10-CM | POA: Diagnosis not present

## 2023-04-04 LAB — CBC
HCT: 31.8 % — ABNORMAL LOW (ref 39.0–52.0)
Hemoglobin: 10.2 g/dL — ABNORMAL LOW (ref 13.0–17.0)
MCH: 26.9 pg (ref 26.0–34.0)
MCHC: 32.1 g/dL (ref 30.0–36.0)
MCV: 83.9 fL (ref 80.0–100.0)
Platelets: 322 10*3/uL (ref 150–400)
RBC: 3.79 MIL/uL — ABNORMAL LOW (ref 4.22–5.81)
RDW: 15.6 % — ABNORMAL HIGH (ref 11.5–15.5)
WBC: 3.3 10*3/uL — ABNORMAL LOW (ref 4.0–10.5)
nRBC: 0 % (ref 0.0–0.2)

## 2023-04-04 LAB — ECHOCARDIOGRAM COMPLETE
Height: 66 in
S' Lateral: 3.3 cm
Weight: 3068.8 oz

## 2023-04-04 LAB — BASIC METABOLIC PANEL
Anion gap: 12 (ref 5–15)
BUN: 15 mg/dL (ref 8–23)
CO2: 28 mmol/L (ref 22–32)
Calcium: 8.9 mg/dL (ref 8.9–10.3)
Chloride: 89 mmol/L — ABNORMAL LOW (ref 98–111)
Creatinine, Ser: 0.72 mg/dL (ref 0.61–1.24)
GFR, Estimated: >60 mL/min (ref >60)
Glucose, Bld: 136 mg/dL — ABNORMAL HIGH (ref 70–99)
Potassium: 3.7 mmol/L (ref 3.5–5.1)
Sodium: 129 mmol/L — ABNORMAL LOW (ref 135–145)

## 2023-04-04 LAB — RESPIRATORY PANEL BY PCR

## 2023-04-04 MED ORDER — ATORVASTATIN CALCIUM 20 MG PO TABS
40.0000 mg | ORAL_TABLET | Freq: Every day | ORAL | Status: DC
  Administered 2023-04-04: 40 mg via ORAL
  Filled 2023-04-04: qty 2

## 2023-04-04 MED ORDER — LOSARTAN POTASSIUM 50 MG PO TABS
100.0000 mg | ORAL_TABLET | Freq: Every day | ORAL | Status: DC
  Administered 2023-04-04 – 2023-04-05 (×2): 100 mg via ORAL
  Filled 2023-04-04 (×2): qty 2

## 2023-04-04 MED ORDER — HYDROCHLOROTHIAZIDE 25 MG PO TABS
25.0000 mg | ORAL_TABLET | Freq: Every day | ORAL | Status: DC
  Administered 2023-04-04 – 2023-04-05 (×2): 25 mg via ORAL
  Filled 2023-04-04 (×2): qty 1

## 2023-04-04 MED ORDER — TAMSULOSIN HCL 0.4 MG PO CAPS
0.4000 mg | ORAL_CAPSULE | Freq: Every day | ORAL | Status: DC
  Administered 2023-04-04: 0.4 mg via ORAL
  Filled 2023-04-04: qty 1

## 2023-04-04 MED ORDER — AMLODIPINE BESYLATE 10 MG PO TABS
10.0000 mg | ORAL_TABLET | Freq: Every day | ORAL | Status: DC
  Administered 2023-04-04 – 2023-04-05 (×2): 10 mg via ORAL
  Filled 2023-04-04 (×2): qty 1

## 2023-04-04 MED ORDER — ADULT MULTIVITAMIN W/MINERALS CH
1.0000 | ORAL_TABLET | Freq: Every day | ORAL | Status: DC
  Administered 2023-04-04 – 2023-04-05 (×2): 1 via ORAL
  Filled 2023-04-04 (×2): qty 1

## 2023-04-04 MED ORDER — SENNOSIDES-DOCUSATE SODIUM 8.6-50 MG PO TABS
1.0000 | ORAL_TABLET | Freq: Two times a day (BID) | ORAL | Status: DC
  Administered 2023-04-04 – 2023-04-05 (×3): 1 via ORAL
  Filled 2023-04-04 (×3): qty 1

## 2023-04-04 MED ORDER — MAGNESIUM OXIDE 400 MG PO TABS
400.0000 mg | ORAL_TABLET | Freq: Every day | ORAL | Status: DC
  Administered 2023-04-04 – 2023-04-05 (×2): 400 mg via ORAL
  Filled 2023-04-04 (×4): qty 1

## 2023-04-04 MED ORDER — GABAPENTIN 100 MG PO CAPS
200.0000 mg | ORAL_CAPSULE | Freq: Three times a day (TID) | ORAL | Status: DC
  Administered 2023-04-04 – 2023-04-05 (×3): 200 mg via ORAL
  Filled 2023-04-04 (×3): qty 2

## 2023-04-04 MED ORDER — POTASSIUM CHLORIDE CRYS ER 20 MEQ PO TBCR
20.0000 meq | EXTENDED_RELEASE_TABLET | Freq: Every day | ORAL | Status: DC
  Administered 2023-04-04 – 2023-04-05 (×2): 20 meq via ORAL
  Filled 2023-04-04 (×2): qty 1

## 2023-04-04 MED ORDER — LOSARTAN POTASSIUM-HCTZ 100-25 MG PO TABS: 1.0000 | ORAL_TABLET | Freq: Every day | ORAL | Status: DC

## 2023-04-04 MED ORDER — METOPROLOL SUCCINATE ER 50 MG PO TB24
50.0000 mg | ORAL_TABLET | Freq: Every day | ORAL | Status: DC
  Administered 2023-04-04 – 2023-04-05 (×2): 50 mg via ORAL
  Filled 2023-04-04 (×2): qty 1

## 2023-04-04 MED ORDER — PANTOPRAZOLE SODIUM 40 MG PO TBEC
40.0000 mg | DELAYED_RELEASE_TABLET | Freq: Two times a day (BID) | ORAL | Status: DC
  Administered 2023-04-04 – 2023-04-05 (×2): 40 mg via ORAL
  Filled 2023-04-04 (×2): qty 1

## 2023-04-04 MED ORDER — IPRATROPIUM-ALBUTEROL 0.5-2.5 (3) MG/3ML IN SOLN: 3.0000 mL | Freq: Four times a day (QID) | RESPIRATORY_TRACT | Status: DC | PRN

## 2023-04-04 MED ORDER — PERFLUTREN LIPID MICROSPHERE
1.0000 mL | INTRAVENOUS | Status: AC | PRN
  Administered 2023-04-04: 5 mL via INTRAVENOUS

## 2023-04-04 MED ORDER — BUTALBITAL-APAP-CAFFEINE 50-325-40 MG PO TABS
1.0000 | ORAL_TABLET | Freq: Once | ORAL | Status: AC
  Administered 2023-04-04: 1 via ORAL
  Filled 2023-04-04: qty 1

## 2023-04-04 MED ORDER — MONTELUKAST SODIUM 10 MG PO TABS
10.0000 mg | ORAL_TABLET | Freq: Every day | ORAL | Status: DC
  Administered 2023-04-04: 10 mg via ORAL
  Filled 2023-04-04: qty 1

## 2023-04-04 MED ORDER — MOMETASONE FURO-FORMOTEROL FUM 200-5 MCG/ACT IN AERO
2.0000 | INHALATION_SPRAY | Freq: Two times a day (BID) | RESPIRATORY_TRACT | Status: DC
  Administered 2023-04-04 – 2023-04-05 (×2): 2 via RESPIRATORY_TRACT
  Filled 2023-04-04: qty 8.8

## 2023-04-04 NOTE — Progress Notes (Signed)
PROGRESS NOTE    Austin Patterson   ZHY:[id number] DOB: 1956/07/03  DOA: 04/03/2023 Date of Service: 04/04/23 PCP: Gildardo Pounds, PA     Brief Narrative / Hospital Course:  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 06/15: admitted for chest pain / COPD. Chest pain felt likely d/t chest mass RLL lung, bx pending.  06/16: Echo no concerns (cardiology consult deferred). RLL mass bx pending d/t no IR availability on the weekend    Consultants:  IR  Procedures: none      ASSESSMENT & PLAN:   Principal Problem:   Chest pain Active Problems:   CAD (coronary artery disease)   Hyponatremia   IPF (idiopathic pulmonary fibrosis) (HCC)   Essential hypertension   Hyperlipidemia   Chronic respiratory failure with hypoxia (HCC)   Alpha-1-antitrypsin deficiency carrier   GERD without esophagitis   Right lower lobe lung mass   Cough   Alcohol use   Right lower lobe lung mass  Large bulla adjacent to mass  CT lung mass biopsy, routine, ordered on admission for the new 6.8 cm mass lesion in the anterior right midlung field with CT read suggesting possible malignant neoplasm with possible weight of 2 small nodule densities in the right lower lung field suggesting metastatic disease Biopsy pending - felt keeping inpatient for this d/t adjacent bulla giving risk for PTX  Chest pain likely d/t lung mass  With chest pressure worse with exertion NM myocardial multi stress evaluation in 07/27/2022 was read as no ishemia, low risk study I believe the chest  pain/pressure is not cardiac related, however given patient's age, history of cad s/p PCI in 2009/2010,Echo ordered which showed no concerns Follow cardiology outpatient   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, baseline serum sodium level over the past 3 months has been 126-133 Recheck BMP   Chronic respiratory failure with hypoxia (HCC) Patient is on chronic 2 L nasal cannula, and currently at baseline on admission Continue O2 as needed  Alpha-1-antitrypsin deficiency carrier Continue outpatient follow-up with pulmonologist  Cough Presumed multifactorial in setting of idiopathic pulmonary fibrosis with chronic COPD requiring chronic 2 L nasal cannula and new right lower lobe lung nodule Symptomatic support: Guaifenesin 5 mL, p.o., every 6 hours as needed to loosen phlegm, 2 days ordered; Tussionex 5 mL oral nightly as needed for cough, 2 days ordered  Alcohol use Lorazepam 2 mg IV as needed for seizure, 2 doses ordered with instruction for nursing to administer as appropriate and then let provider know for possible need of CIWA initiation    DVT prophylaxis: SCD Pertinent IV fluids/nutrition: no conitnuous fluids  Central lines / invasive devices: none  Code Status: FULL CODE ACP documentation reviewed: none on file  Current Admission Status: observation  TOC needs / Dispo plan: TBD Barriers to discharge / significant pending items: biopsy              Subjective / Brief ROS:  Patient reports CP has resolved Pain w/ neuropathy feet Denies CP/SOB.  Pain.  Denies new weakness.  Tolerating diet.  Reports no concerns w/ urination/defecation.   Family Communication: none at this time  Objective Findings:  Vitals:   04/04/23 0003 04/04/23 0336 04/04/23 0907 04/04/23 1201  BP: (!) 149/63 (!) 142/83 (!) 159/77 (!) 146/75  Pulse: 94 84 75 82  Resp: 18 18 20 20   Temp: 98 F (36.7 C) 98.2 F (36.8 C) 98 F (36.7 C) 98.6 F (37 C)  TempSrc:   Oral    SpO2: 92% 96%    Weight:      Height:        Intake/Output Summary (Last 24 hours) at 04/04/2023 1753 Last data filed at 04/04/2023 1518 Gross per 24 hour  Intake --  Output 1400 ml  Net -1400 ml   Filed Weights   04/03/23 1144  Weight: 87 kg    Examination:  Physical Exam Constitutional:      General: He is not in acute distress. Cardiovascular:     Rate and Rhythm: Normal rate and regular rhythm.  Pulmonary:     Effort: Pulmonary effort is normal.     Breath sounds: Decreased breath sounds present.  Musculoskeletal:     Right lower leg: No edema.     Left lower leg: No edema.  Neurological:     General: No focal deficit present.     Mental Status: He is alert and oriented to person, place, and time.  Psychiatric:        Mood and Affect: Mood normal.        Behavior: Behavior normal.          Scheduled Medications:   amLODipine  10 mg Oral Daily   atorvastatin  40 mg Oral QHS   gabapentin  200 mg Oral TID   losartan  100 mg Oral Daily   And   hydrochlorothiazide  25 mg Oral Daily   magnesium oxide  400 mg Oral Daily   metoprolol succinate  50 mg Oral Daily   mometasone-formoterol  2 puff Inhalation BID   montelukast  10 mg Oral QHS   multivitamin with minerals  1 tablet Oral Daily   pantoprazole  40 mg Oral BID AC   potassium chloride SA  20 mEq Oral Daily   senna-docusate  1 tablet Oral BID   tamsulosin  0.4 mg Oral QPC supper    Continuous Infusions:   PRN Medications:  acetaminophen **OR** acetaminophen, chlorpheniramine-HYDROcodone, guaiFENesin, ipratropium-albuterol, LORazepam, melatonin, ondansetron (ZOFRAN) IV, mouth rinse  Antimicrobials from admission:  Anti-infectives (From admission, onward)    None           Data Reviewed:  I have personally reviewed the following...  CBC: Recent Labs  Lab 04/03/23 1148 04/04/23 0537  WBC 4.4 3.3*  HGB 10.5* 10.2*  HCT 32.7* 31.8*  MCV 85.2 83.9  PLT 319 322   Basic Metabolic  Panel: Recent Labs  Lab 04/03/23 1148 04/04/23 0537  NA 125* 129*  K 3.9 3.7  CL 86* 89*  CO2 26 28  GLUCOSE 121* 136*  BUN 11 15  CREATININE 0.69 0.72  CALCIUM 8.7* 8.9   GFR: Estimated Creatinine Clearance: 92.6 mL/min (by C-G formula based on SCr of 0.72 mg/dL). Liver Function Tests: No results for input(s): "AST", "ALT", "ALKPHOS", "BILITOT", "PROT", "ALBUMIN" in the last 168 hours. No results for input(s): "LIPASE", "AMYLASE" in the last 168 hours. No results for input(s): "AMMONIA" in the last 168 hours. Coagulation Profile: No results for input(s): "INR", "PROTIME" in the last 168 hours. Cardiac Enzymes: No results for input(s): "CKTOTAL", "CKMB", "CKMBINDEX", "TROPONINI" in the last 168 hours. BNP (last 3  results) No results for input(s): "PROBNP" in the last 8760 hours. HbA1C: No results for input(s): "HGBA1C" in the last 72 hours. CBG: No results for input(s): "GLUCAP" in the last 168 hours. Lipid Profile: No results for input(s): "CHOL", "HDL", "LDLCALC", "TRIG", "CHOLHDL", "LDLDIRECT" in the last 72 hours. Thyroid Function Tests: No results for input(s): "TSH", "T4TOTAL", "FREET4", "T3FREE", "THYROIDAB" in the last 72 hours. Anemia Panel: No results for input(s): "VITAMINB12", "FOLATE", "FERRITIN", "TIBC", "IRON", "RETICCTPCT" in the last 72 hours. Most Recent Urinalysis On File:     Component Value Date/Time   COLORURINE COLORLESS (A) 12/07/2022 1235   APPEARANCEUR CLEAR (A) 12/07/2022 1235   LABSPEC 1.005 12/07/2022 1235   PHURINE 7.0 12/07/2022 1235   GLUCOSEU NEGATIVE 12/07/2022 1235   HGBUR NEGATIVE 12/07/2022 1235   BILIRUBINUR NEGATIVE 12/07/2022 1235   KETONESUR NEGATIVE 12/07/2022 1235   PROTEINUR NEGATIVE 12/07/2022 1235   NITRITE NEGATIVE 12/07/2022 1235   LEUKOCYTESUR NEGATIVE 12/07/2022 1235   Sepsis Labs: @LABRCNTIP (procalcitonin:4,lacticidven:4) Microbiology: Recent Results (from the past 240 hour(s))  Resp Panel by RT-PCR (Flu A&B,  Covid) Anterior Nasal Swab     Status: Abnormal   Collection Time: 04/03/23  3:43 PM   Specimen: Anterior Nasal Swab  Result Value Ref Range Status   SARS Coronavirus 2 by RT PCR POSITIVE (A) NEGATIVE Final    Comment: (NOTE) SARS-CoV-2 target nucleic acids are DETECTED.  The SARS-CoV-2 RNA is generally detectable in upper respiratory specimens during the acute phase of infection. Positive results are indicative of the presence of the identified virus, but do not rule out bacterial infection or co-infection with other pathogens not detected by the test. Clinical correlation with patient history and other diagnostic information is necessary to determine patient infection status. The expected result is Negative.  Fact Sheet for Patients: BloggerCourse.com  Fact Sheet for Healthcare Providers: SeriousBroker.it  This test is not yet approved or cleared by the [place] FDA and  has been authorized for detection and/or diagnosis of SARS-CoV-2 by FDA under an Emergency Use Authorization (EUA).  This EUA will remain in effect (meaning this test can be used) for the duration of  the COVID-19 declaration under Section 564(b)(1) of the A ct, 21 U.S.C. section 360bbb-3(b)(1), unless the authorization is terminated or revoked sooner.     Influenza A by PCR NEGATIVE NEGATIVE Final   Influenza B by PCR NEGATIVE NEGATIVE Final    Comment: (NOTE) The Xpert Xpress SARS-CoV-2/FLU/RSV plus assay is intended as an aid in the diagnosis of influenza from Nasopharyngeal swab specimens and should not be used as a sole basis for treatment. Nasal washings and aspirates are unacceptable for Xpert Xpress SARS-CoV-2/FLU/RSV testing.  Fact Sheet for Patients: BloggerCourse.com  Fact Sheet for Healthcare Providers: SeriousBroker.it  This test is not yet approved or cleared by the [place] FDA  and has been authorized for detection and/or diagnosis of SARS-CoV-2 by FDA under an Emergency Use Authorization (EUA). This EUA will remain in effect (meaning this test can be used) for the duration of the COVID-19 declaration under Section 564(b)(1) of the Act, 21 U.S.C. section 360bbb-3(b)(1), unless the authorization is terminated or revoked.  Performed at [facility], [address] [place], [place] [address]   Respiratory (~20 pathogens) panel by PCR     Status: None   Collection Time: 04/03/23  4:30 PM  Result Value Ref Range Status   Adenovirus NOT DETECTED NOT DETECTED Final   Coronavirus 229E NOT DETECTED NOT DETECTED Final  Comment: (NOTE) The Coronavirus on the Respiratory Panel, DOES NOT test for the novel  Coronavirus (2019 nCoV)    Coronavirus HKU1 NOT DETECTED NOT DETECTED Final   Coronavirus NL63 NOT DETECTED NOT DETECTED Final   Coronavirus OC43 NOT DETECTED NOT DETECTED Final   Metapneumovirus NOT DETECTED NOT DETECTED Final   Rhinovirus / Enterovirus NOT DETECTED NOT DETECTED Final   Influenza A NOT DETECTED NOT DETECTED Final   Influenza B NOT DETECTED NOT DETECTED Final   Parainfluenza Virus 1 NOT DETECTED NOT DETECTED Final   Parainfluenza Virus 2 NOT DETECTED NOT DETECTED Final   Parainfluenza Virus 3 NOT DETECTED NOT DETECTED Final   Parainfluenza Virus 4 NOT DETECTED NOT DETECTED Final   Respiratory Syncytial Virus NOT DETECTED NOT DETECTED Final   Bordetella pertussis NOT DETECTED NOT DETECTED Final   Bordetella Parapertussis NOT DETECTED NOT DETECTED Final   Chlamydophila pneumoniae NOT DETECTED NOT DETECTED Final   Mycoplasma pneumoniae NOT DETECTED NOT DETECTED Final    Comment: Performed at [facility], [address] [place], [place] [address]      Radiology Studies last 3 days: ECHOCARDIOGRAM COMPLETE  Result Date: 04/04/2023    ECHOCARDIOGRAM REPORT   Patient Name:   Austin Patterson Date of Exam: 04/04/2023 Medical  Rec #:  [id number]          Height:       66.0 in Accession #:    [id number]         Weight:       191.8 lb Date of Birth:  [birth year]           BSA:          1.965 m Patient Age:    67 years           BP:           142/83 mmHg Patient Gender: M                  HR:           79 bpm. Exam Location:  [facility] Procedure: 2D Echo and Intracardiac Opacification Agent Indications:     CHEST PAIN R07.9                  DYSPNEA R06.00  History:         Patient has prior history of Echocardiogram examinations, most                  recent 08/26/2022.  Sonographer:     Overton Mam RDCS, FASE Referring Phys:  [id number] AMY N COX Diagnosing Phys: Julien Nordmann MD  Sonographer Comments: Technically challenging study due to limited acoustic windows, no parasternal window and no apical window. Image acquisition challenging due to respiratory motion. IMPRESSIONS  1. Left ventricular ejection fraction, by estimation, is 55 to 60%. The left ventricle has normal function. The left ventricle has no regional wall motion abnormalities. Left ventricular diastolic parameters are indeterminate.  2. Right ventricular systolic function is normal. The right ventricular size is normal. Tricuspid regurgitation signal is inadequate for assessing PA pressure.  3. The mitral valve is normal in structure. No evidence of mitral valve regurgitation. No evidence of mitral stenosis.  4. The aortic valve has an indeterminant number of cusps. Aortic valve regurgitation is not visualized. Aortic valve sclerosis is present, with no evidence of aortic valve stenosis.  5. The inferior vena cava is normal in size with greater than 50% respiratory variability,  suggesting right atrial pressure of 3 mmHg. FINDINGS  Left Ventricle: Left ventricular ejection fraction, by estimation, is 55 to 60%. The left ventricle has normal function. The left ventricle has no regional wall motion abnormalities. Definity contrast agent was given IV to delineate the left ventricular   endocardial borders. The left ventricular internal cavity size was normal in size. There is no left ventricular hypertrophy. Left ventricular diastolic parameters are indeterminate. Right Ventricle: The right ventricular size is normal. No increase in right ventricular wall thickness. Right ventricular systolic function is normal. Tricuspid regurgitation signal is inadequate for assessing PA pressure. Left Atrium: Left atrial size was normal in size. Right Atrium: Right atrial size was normal in size. Pericardium: There is no evidence of pericardial effusion. Mitral Valve: The mitral valve is normal in structure. No evidence of mitral valve regurgitation. No evidence of mitral valve stenosis. Tricuspid Valve: The tricuspid valve is normal in structure. Tricuspid valve regurgitation is not demonstrated. No evidence of tricuspid stenosis. Aortic Valve: The aortic valve has an indeterminant number of cusps. Aortic valve regurgitation is not visualized. Aortic valve sclerosis is present, with no evidence of aortic valve stenosis. Pulmonic Valve: The pulmonic valve was normal in structure. Pulmonic valve regurgitation is not visualized. No evidence of pulmonic stenosis. Aorta: The aortic root is normal in size and structure. Venous: The inferior vena cava is normal in size with greater than 50% respiratory variability, suggesting right atrial pressure of 3 mmHg. IAS/Shunts: No atrial level shunt detected by color flow Doppler.  LEFT VENTRICLE PLAX 2D LVIDd:         4.70 cm LVIDs:         3.30 cm LV PW:         1.00 cm LV IVS:        1.00 cm LVOT diam:     2.10 cm LVOT Area:     3.46 cm  LEFT ATRIUM         Index LA diam:    2.70 cm 1.37 cm/m   AORTA Ao Root diam: 4.00 cm  SHUNTS Systemic Diam: 2.10 cm Julien Nordmann MD Electronically signed by Julien Nordmann MD Signature Date/Time: 04/04/2023/10:37:17 AM    Final    CT Angio Chest PE W and/or Wo Contrast  Result Date: 04/03/2023 CLINICAL DATA:  High clinical  suspicion for PE EXAM: CT ANGIOGRAPHY CHEST WITH CONTRAST TECHNIQUE: Multidetector CT imaging of the chest was performed using the standard protocol during bolus administration of intravenous contrast. Multiplanar CT image reconstructions and MIPs were obtained to evaluate the vascular anatomy. RADIATION DOSE REDUCTION: This exam was performed according to the departmental dose-optimization program which includes automated exposure control, adjustment of the mA and/or kV according to patient size and/or use of iterative reconstruction technique. CONTRAST:  75mL OMNIPAQUE IOHEXOL 350 MG/ML SOLN COMPARISON:  02/09/2020 FINDINGS: Cardiovascular: There is homogeneous enhancement in thoracic aorta. There are scattered atherosclerotic plaques and calcifications in thoracic aorta and its major branches. There are no intraluminal filling defects in pulmonary artery branches. Coronary artery calcifications are seen. Mediastinum/Nodes: There are enlarged lymph nodes in mediastinum measuring up to 2.1 x 1.9 cm in precarinal region. There is enlarged lymph node in right hilum measuring 2.8 x 1.8 cm. Lungs/Pleura: There is a new 6.8 x 4.9 cm soft tissue mass in the anterior right mid lung field. In image 108 of series 5, there is 10 mm nodule in right lower lung field. In image 114, there is 1.8 cm nodule in right  lower lung field. Large bulla is seen in lateral aspect of right lung. Centrilobular and panlobular emphysema is seen. There is no pleural effusion or pneumothorax. Upper Abdomen: There is 2.1 cm nodule in right adrenal. There is 1.8 cm nodule in left adrenal. Density measurements are less than 0 Hounsfield units suggesting adenomas. Musculoskeletal: There is interval vertebroplasty in the bodies of T7 and T11 vertebrae. Old compression fractures are seen in T7 and T11 vertebrae. Review of the MIP images confirms the above findings. IMPRESSION: There is no evidence of pulmonary artery embolism. There is no evidence of  thoracic aortic dissection. Aortic atherosclerosis. Coronary artery calcifications are seen. There is new 6.8 cm mass lesion in the anterior right midlung field suggesting possible malignant neoplasm. There are 2 small nodular densities in right lower lung field suggesting possible metastatic disease. There are abnormally enlarged lymph nodes in right hilum and mediastinum suggesting possible metastatic lymphadenopathy. Follow-up PET-CT and tissue sampling as clinically warranted should be considered. There are low-density nodules in both adrenals suggesting adenomas. Old compression fractures are seen in bodies of T7 and T11 vertebrae with interval vertebroplasty. Electronically Signed   By: Ernie Avena M.D.   On: 04/03/2023 14:30   CT Renal Stone Study  Result Date: 04/03/2023 CLINICAL DATA:  Bladder-neck obstruction frequent, small volume urination for several days EXAM: CT ABDOMEN AND PELVIS WITHOUT CONTRAST TECHNIQUE: Multidetector CT imaging of the abdomen and pelvis was performed following the standard protocol without IV contrast. RADIATION DOSE REDUCTION: This exam was performed according to the departmental dose-optimization program which includes automated exposure control, adjustment of the mA and/or kV according to patient size and/or use of iterative reconstruction technique. COMPARISON:  12/07/2022 FINDINGS: Lower chest: Advanced emphysematous changes within the lung bases. Two enlarging right lower lobe pulmonary nodules (series 4, images 10 and 19). See dedicated CT chest for full detail. Hepatobiliary: Unremarkable unenhanced appearance of the liver. No focal liver lesion identified. Gallbladder within normal limits. No hyperdense gallstone. No biliary dilatation. Pancreas: Unremarkable. No pancreatic ductal dilatation or surrounding inflammatory changes. Spleen: Normal in size without focal abnormality. Adrenals/Urinary Tract: Stable bilateral benign adrenal adenomas which do not  require follow-up imaging. Cortical scarring of the lower pole of the right kidney. Atherosclerotic calcification within the bilateral renal hila. Kidneys appear otherwise within normal limits. No renal stone or hydronephrosis. Urinary bladder is within normal limits. Stomach/Bowel: Stomach is within normal limits. Scattered colonic diverticulosis. No evidence of bowel wall thickening, distention, or inflammatory changes. Vascular/Lymphatic: Advanced aortoiliac atherosclerotic calcification. No aneurysm. No abdominopelvic lymphadenopathy. Reproductive: Prostate is unremarkable. Other: No free fluid. No abdominopelvic fluid collection. No pneumoperitoneum. Small fat containing umbilical hernia. Musculoskeletal: No acute or significant osseous findings. IMPRESSION: 1. No acute abdominopelvic findings. No evidence of obstructive uropathy. 2. Two enlarging right lower lobe pulmonary nodules, suspicious for malignancy. See dedicated CT chest for full detail. 3. Colonic diverticulosis without evidence of acute diverticulitis. 4. Aortic atherosclerosis (ICD10-I70.0). Electronically Signed   By: Duanne Guess D.O.   On: 04/03/2023 14:27   DG Chest 2 View  Result Date: 04/03/2023 CLINICAL DATA:  Shortness of breath EXAM: CHEST - 2 VIEW COMPARISON:  X-ray 12/07/2022 FINDINGS: Rounded masslike area in the right lung base. This would have a differential including aggressive lesion. Recommend follow up chest CT to further delineate. Left midlung scar or atelectasis. No pneumothorax or separate consolidation. No joint effusion. Normal cardiopericardial silhouette. Degenerative changes of the spine with the areas of compression and augmentation cement, similar to previous  IMPRESSION: Developing rounded masslike opacity at the right lung base measuring 7 cm. An aggressive lesion is possible and recommend follow-up contrast chest CT when appropriate Electronically Signed   By: Karen Kays M.D.   On: 04/03/2023 12:11              LOS: 0 days      Sunnie Nielsen, DO [facility] 04/04/2023, 5:53 PM    Dictation software may have been used to generate the above note. Typos may occur and escape review in typed/dictated notes. Please contact Dr Lyn Hollingshead directly for clarity if needed.  Staff may message me via secure chat in Epic  but this may not receive an immediate response,  please page me for urgent matters!  If 7PM-7AM, please contact night coverage [email or web address]

## 2023-04-04 NOTE — Progress Notes (Signed)
  Echocardiogram 2D Echocardiogram has been performed. Definity Iv ultrasound imaging agent used on this study.  Austin Patterson 04/04/2023, 9:04 AM

## 2023-04-05 ENCOUNTER — Observation Stay: Payer: Medicare HMO

## 2023-04-05 DIAGNOSIS — R918 Other nonspecific abnormal finding of lung field: Secondary | ICD-10-CM | POA: Diagnosis not present

## 2023-04-05 MED ORDER — GABAPENTIN 300 MG PO CAPS: 300.0000 mg | ORAL_CAPSULE | Freq: Three times a day (TID) | ORAL | 0 refills | Status: DC

## 2023-04-05 MED ORDER — HYDROCOD POLI-CHLORPHE POLI ER 10-8 MG/5ML PO SUER: 5.0000 mL | Freq: Every evening | ORAL | 0 refills | Status: DC | PRN

## 2023-04-05 NOTE — Progress Notes (Signed)
IR received lung mass biopsy request. Patient case reviewed by Dr. Archer Asa. Patient will need further work up including PET scan. Oncology can request outpatient biopsy when appropriate. Dr. Lyn Hollingshead made aware. No IR procedure planned and the order for biopsy will be deleted.  Austin Patterson, [place] [phone] 04/05/2023, 7:58 AM

## 2023-04-05 NOTE — Progress Notes (Signed)
Austin Patterson  A and O x 4. VSS. Pt tolerating diet well. No complaints of pain or nausea. IV removed intact, prescriptions given. Pt voiced understanding of discharge instructions with no further questions. Pt discharged via wheelchair with axillary.    Allergies as of 04/05/2023       Reactions   Amoxicillin Anaphylaxis   Tizanidine    Feet and ankle swell         Medication List     TAKE these medications    acetaminophen 500 MG tablet Commonly known as: TYLENOL Take 1,000 mg by mouth every 6 (six) hours as needed for moderate pain or headache.   AeroChamber MV inhaler Use as instructed   albuterol 108 (90 Base) MCG/ACT inhaler Commonly known as: VENTOLIN HFA Inhale 2 puffs into the lungs every 6 (six) hours as needed for wheezing or shortness of breath.   amLODipine 10 MG tablet Commonly known as: NORVASC Take 1 tablet (10 mg total) by mouth daily.   aspirin EC 81 MG tablet Take 1 tablet (81 mg total) by mouth daily.   atorvastatin 40 MG tablet Commonly known as: LIPITOR Take 1 tablet (40 mg total) by mouth at bedtime.   Calcium 600/Vitamin D 600-10 MG-MCG Tabs Generic drug: Calcium Carb-Cholecalciferol Take 1 tablet by mouth 2 (two) times daily.   chlorpheniramine-HYDROcodone 10-8 MG/5ML Commonly known as: TUSSIONEX Take 5 mLs by mouth at bedtime as needed for cough.   clopidogrel 75 MG tablet Commonly known as: PLAVIX Take 1 tablet (75 mg total) by mouth daily.   Dupixent 300 MG/2ML Sopn Generic drug: Dupilumab Inject 300 mg into the skin every 14 (fourteen) days.   Ensure Max Protein Liqd Take 330 mLs (11 oz total) by mouth 2 (two) times daily.   EPINEPHrine 0.3 mg/0.3 mL Soaj injection Commonly known as: EPI-PEN Inject 0.3 mg into the muscle as needed for anaphylaxis.   fluticasone-salmeterol 250-50 MCG/ACT Aepb Commonly known as: Advair Diskus Inhale 1 puff into the lungs in the morning and at bedtime.   gabapentin 300 MG  capsule Commonly known as: NEURONTIN Take 1 capsule (300 mg total) by mouth 3 (three) times daily.   ipratropium-albuterol 0.5-2.5 (3) MG/3ML Soln Commonly known as: DUONEB INHALE 1 VIAL THROUGH NEBULIZER EVERY 6 HOURS   Klor-Con M20 20 MEQ tablet Generic drug: potassium chloride SA Take 20 mEq by mouth daily.   losartan-hydrochlorothiazide 100-25 MG tablet Commonly known as: HYZAAR Take 1 tablet by mouth daily.   magnesium oxide 400 (241.3 Mg) MG tablet Commonly known as: MAG-OX Take 1 tablet (400 mg total) by mouth daily.   metoprolol succinate 50 MG 24 hr tablet Commonly known as: TOPROL-XL Take 1 tablet (50 mg total) by mouth daily.   montelukast 10 MG tablet Commonly known as: SINGULAIR Take 10 mg by mouth daily.   multivitamin with minerals Tabs tablet Take 1 tablet by mouth daily.   pantoprazole 40 MG tablet Commonly known as: PROTONIX Take 1 tablet (40 mg total) by mouth 2 (two) times daily.   senna-docusate 8.6-50 MG tablet Commonly known as: Senokot-S Take 1 tablet by mouth 2 (two) times daily.   tamsulosin 0.4 MG Caps capsule Commonly known as: FLOMAX Take 1 capsule (0.4 mg total) by mouth daily after supper.        Vitals:   04/05/23 0445 04/05/23 0736  BP: (!) 147/74 125/72  Pulse: 72 81  Resp: 14 20  Temp: 98.7 F (37.1 C) 98 F (36.7 C)  SpO2:  98% 95%    Suzzanne Cloud

## 2023-04-05 NOTE — Discharge Summary (Signed)
Physician Discharge Summary   Patient: Austin Patterson MRN: [id number]  DOB: [birth year]   Admit:     Date of Admission: 04/03/2023 Admitted from: home   Discharge: Date of discharge: 04/05/23 Disposition: Home Condition at discharge: good  CODE STATUS: FULL CODE     Discharge Physician: Sunnie Nielsen, DO [facility]     PCP: Gildardo Pounds, PA  Recommendations for Outpatient Follow-up:  Follow up with PCP Gildardo Pounds, PA in 2-4 weeks Follow up with cancer center this week - they will call patient Please obtain labs/tests: needs PET scan / other w/u for lung mass, follow CBC, BMP Please follow up on the following pending results: none PCP AND OTHER OUTPATIENT PROVIDERS: SEE BELOW FOR SPECIFIC DISCHARGE INSTRUCTIONS PRINTED FOR PATIENT IN ADDITION TO GENERIC AVS PATIENT INFO    Discharge Instructions     Diet - low sodium heart healthy   Complete by: As directed    Increase activity slowly   Complete by: As directed          Discharge Diagnoses: Principal Problem:   Chest pain Active Problems:   CAD (coronary artery disease)   Hyponatremia   IPF (idiopathic pulmonary fibrosis) (HCC)   Essential hypertension   Hyperlipidemia   Chronic respiratory failure with hypoxia (HCC)   Alpha-1-antitrypsin deficiency carrier   GERD without esophagitis   Right lower lobe lung mass   Cough   Alcohol use       Hospital Course: Austin Patterson is a 67 year old male with history of hypertension, alcohol use, hyperlipidemia, CAD status post PCI in 2009/2010, GERD, BPH, history of asthma and COPD overlap, chronic respiratory failure on 2 L nasal cannula, idiopathic pulmonary fibrosis, 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 06/15: admitted for chest pain / COPD. Chest pain  felt likely d/t chest mass RLL lung, bx pending.  06/16: Echo no concerns (cardiology consult deferred). 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       Discharge Instructions  Allergies as of 04/05/2023       Reactions   Amoxicillin Anaphylaxis   Tizanidine    Feet and ankle swell         Medication List     TAKE these medications    acetaminophen 500 MG tablet Commonly known as: TYLENOL Take 1,000 mg by mouth every 6 (six) hours as needed for moderate pain or headache.   AeroChamber MV inhaler Use as instructed   albuterol 108 (90 Base) MCG/ACT inhaler Commonly known as: VENTOLIN HFA Inhale 2 puffs into the lungs every 6 (six) hours as needed for wheezing or shortness of breath.   amLODipine 10 MG tablet Commonly known as: NORVASC Take 1 tablet (10 mg total) by mouth daily.   aspirin EC 81 MG tablet Take 1 tablet (81 mg total) by mouth daily.   atorvastatin 40 MG tablet Commonly known as: LIPITOR Take 1 tablet (40 mg total) by mouth at bedtime.   Calcium 600/Vitamin D 600-10 MG-MCG Tabs Generic drug: Calcium Carb-Cholecalciferol Take 1 tablet by mouth 2 (two) times daily.   chlorpheniramine-HYDROcodone 10-8 MG/5ML Commonly known as: TUSSIONEX Take 5 mLs by mouth at bedtime as needed for cough.   clopidogrel 75 MG tablet Commonly known as: PLAVIX Take 1 tablet (75 mg total) by mouth daily.   Dupixent 300 MG/2ML Sopn Generic drug: Dupilumab Inject 300 mg into the skin every 14 (fourteen) days.   Ensure Max Protein Liqd Take 330 mLs (11 oz total) by mouth 2 (two) times daily.   EPINEPHrine 0.3 mg/0.3 mL Soaj injection Commonly known as: EPI-PEN Inject 0.3 mg into the muscle as needed for anaphylaxis.   fluticasone-salmeterol 250-50 MCG/ACT Aepb Commonly known as: Advair Diskus Inhale 1 puff into the lungs in the morning and at bedtime.   gabapentin 300 MG capsule Commonly known as: NEURONTIN Take 1 capsule (300 mg  total) by mouth 3 (three) times daily.   ipratropium-albuterol 0.5-2.5 (3) MG/3ML Soln Commonly known as: DUONEB INHALE 1 VIAL THROUGH NEBULIZER EVERY 6 HOURS   Klor-Con M20 20 MEQ tablet Generic drug: potassium chloride SA Take 20 mEq by mouth daily.   losartan-hydrochlorothiazide 100-25 MG tablet Commonly known as: HYZAAR Take 1 tablet by mouth daily.   magnesium oxide 400 (241.3 Mg) MG tablet Commonly known as: MAG-OX Take 1 tablet (400 mg total) by mouth daily.   metoprolol succinate 50 MG 24 hr tablet Commonly known as: TOPROL-XL Take 1 tablet (50 mg total) by mouth daily.   montelukast 10 MG tablet Commonly known as: SINGULAIR Take 10 mg by mouth daily.   multivitamin with minerals Tabs tablet Take 1 tablet by mouth daily.   pantoprazole 40 MG tablet Commonly known as: PROTONIX Take 1 tablet (40 mg total) by mouth 2 (two) times daily.   senna-docusate 8.6-50 MG tablet Commonly known as: Senokot-S Take 1 tablet by mouth 2 (two) times daily.   tamsulosin 0.4 MG  Caps capsule Commonly known as: FLOMAX Take 1 capsule (0.4 mg total) by mouth daily after supper.          Allergies  Allergen Reactions   Amoxicillin Anaphylaxis   Tizanidine     Feet and ankle swell      Subjective: pt feeling ok this morning, still some chest discomfort but better than it's been, no SOB, on his usual O2 at 2L and comfortable    Discharge Exam: BP 125/72 (BP Location: Left Arm)   Pulse 81   Temp 98 F (36.7 C) (Oral)   Resp 20   Ht 5\' 6"  (1.676 m)   Wt 87 kg   SpO2 95%   BMI 30.96 kg/m  General: Pt is alert, awake, not in acute distress Cardiovascular: RRR, S1/S2 +, no rubs, no gallops Respiratory: CTA bilaterally, decreased breath sounds all fields  Abdominal: Soft, NT, ND, bowel sounds + Extremities: no edema, no cyanosis     The results of significant diagnostics from this hospitalization (including imaging, microbiology, ancillary and laboratory) are  listed below for reference.     Microbiology: Recent Results (from the past 240 hour(s))  Resp Panel by RT-PCR (Flu A&B, Covid) Anterior Nasal Swab     Status: Abnormal   Collection Time: 04/03/23  3:43 PM   Specimen: Anterior Nasal Swab  Result Value Ref Range Status   SARS Coronavirus 2 by RT PCR POSITIVE (A) NEGATIVE Final    Comment: (NOTE) SARS-CoV-2 target nucleic acids are DETECTED.  The SARS-CoV-2 RNA is generally detectable in upper respiratory specimens during the acute phase of infection. Positive results are indicative of the presence of the identified virus, but do not rule out bacterial infection or co-infection with other pathogens not detected by the test. Clinical correlation with patient history and other diagnostic information is necessary to determine patient infection status. The expected result is Negative.  Fact Sheet for Patients: BloggerCourse.com  Fact Sheet for Healthcare Providers: SeriousBroker.it  This test is not yet approved or cleared by the [place] FDA and  has been authorized for detection and/or diagnosis of SARS-CoV-2 by FDA under an Emergency Use Authorization (EUA).  This EUA will remain in effect (meaning this test can be used) for the duration of  the COVID-19 declaration under Section 564(b)(1) of the A ct, 21 U.S.C. section 360bbb-3(b)(1), unless the authorization is terminated or revoked sooner.     Influenza A by PCR NEGATIVE NEGATIVE Final   Influenza B by PCR NEGATIVE NEGATIVE Final    Comment: (NOTE) The Xpert Xpress SARS-CoV-2/FLU/RSV plus assay is intended as an aid in the diagnosis of influenza from Nasopharyngeal swab specimens and should not be used as a sole basis for treatment. Nasal washings and aspirates are unacceptable for Xpert Xpress SARS-CoV-2/FLU/RSV testing.  Fact Sheet for Patients: BloggerCourse.com  Fact Sheet for Healthcare  Providers: SeriousBroker.it  This test is not yet approved or cleared by the [place] FDA and has been authorized for detection and/or diagnosis of SARS-CoV-2 by FDA under an Emergency Use Authorization (EUA). This EUA will remain in effect (meaning this test can be used) for the duration of the COVID-19 declaration under Section 564(b)(1) of the Act, 21 U.S.C. section 360bbb-3(b)(1), unless the authorization is terminated or revoked.  Performed at [facility], [address] [place], [place] [address]   Respiratory (~20 pathogens) panel by PCR     Status: None   Collection Time: 04/03/23  4:30 PM  Result Value Ref Range Status  Adenovirus NOT DETECTED NOT DETECTED Final   Coronavirus 229E NOT DETECTED NOT DETECTED Final    Comment: (NOTE) The Coronavirus on the Respiratory Panel, DOES NOT test for the novel  Coronavirus (2019 nCoV)    Coronavirus HKU1 NOT DETECTED NOT DETECTED Final   Coronavirus NL63 NOT DETECTED NOT DETECTED Final   Coronavirus OC43 NOT DETECTED NOT DETECTED Final   Metapneumovirus NOT DETECTED NOT DETECTED Final   Rhinovirus / Enterovirus NOT DETECTED NOT DETECTED Final   Influenza A NOT DETECTED NOT DETECTED Final   Influenza B NOT DETECTED NOT DETECTED Final   Parainfluenza Virus 1 NOT DETECTED NOT DETECTED Final   Parainfluenza Virus 2 NOT DETECTED NOT DETECTED Final   Parainfluenza Virus 3 NOT DETECTED NOT DETECTED Final   Parainfluenza Virus 4 NOT DETECTED NOT DETECTED Final   Respiratory Syncytial Virus NOT DETECTED NOT DETECTED Final   Bordetella pertussis NOT DETECTED NOT DETECTED Final   Bordetella Parapertussis NOT DETECTED NOT DETECTED Final   Chlamydophila pneumoniae NOT DETECTED NOT DETECTED Final   Mycoplasma pneumoniae NOT DETECTED NOT DETECTED Final    Comment: Performed at [facility], [address], [place], [place] [address]     Labs: BNP (last 3 results) Recent Labs     06/15/22 1219 12/07/22 1019 04/03/23 1148  BNP 15.9 47.8 29.0   Basic Metabolic Panel: Recent Labs  Lab 04/03/23 1148 04/04/23 0537  NA 125* 129*  K 3.9 3.7  CL 86* 89*  CO2 26 28  GLUCOSE 121* 136*  BUN 11 15  CREATININE 0.69 0.72  CALCIUM 8.7* 8.9   Liver Function Tests: No results for input(s): "AST", "ALT", "ALKPHOS", "BILITOT", "PROT", "ALBUMIN" in the last 168 hours. No results for input(s): "LIPASE", "AMYLASE" in the last 168 hours. No results for input(s): "AMMONIA" in the last 168 hours. CBC: Recent Labs  Lab 04/03/23 1148 04/04/23 0537  WBC 4.4 3.3*  HGB 10.5* 10.2*  HCT 32.7* 31.8*  MCV 85.2 83.9  PLT 319 322   Cardiac Enzymes: No results for input(s): "CKTOTAL", "CKMB", "CKMBINDEX", "TROPONINI" in the last 168 hours. BNP: Invalid input(s): "POCBNP" CBG: No results for input(s): "GLUCAP" in the last 168 hours. D-Dimer No results for input(s): "DDIMER" in the last 72 hours. Hgb A1c No results for input(s): "HGBA1C" in the last 72 hours. Lipid Profile No results for input(s): "CHOL", "HDL", "LDLCALC", "TRIG", "CHOLHDL", "LDLDIRECT" in the last 72 hours. Thyroid function studies No results for input(s): "TSH", "T4TOTAL", "T3FREE", "THYROIDAB" in the last 72 hours.  Invalid input(s): "FREET3" Anemia work up No results for input(s): "VITAMINB12", "FOLATE", "FERRITIN", "TIBC", "IRON", "RETICCTPCT" in the last 72 hours. Urinalysis    Component Value Date/Time   COLORURINE COLORLESS (A) 12/07/2022 1235   APPEARANCEUR CLEAR (A) 12/07/2022 1235   LABSPEC 1.005 12/07/2022 1235   PHURINE 7.0 12/07/2022 1235   GLUCOSEU NEGATIVE 12/07/2022 1235   HGBUR NEGATIVE 12/07/2022 1235   BILIRUBINUR NEGATIVE 12/07/2022 1235   KETONESUR NEGATIVE 12/07/2022 1235   PROTEINUR NEGATIVE 12/07/2022 1235   NITRITE NEGATIVE 12/07/2022 1235   LEUKOCYTESUR NEGATIVE 12/07/2022 1235   Sepsis Labs Recent Labs  Lab 04/03/23 1148 04/04/23 0537  WBC 4.4 3.3*    Microbiology Recent Results (from the past 240 hour(s))  Resp Panel by RT-PCR (Flu A&B, Covid) Anterior Nasal Swab     Status: Abnormal   Collection Time: 04/03/23  3:43 PM   Specimen: Anterior Nasal Swab  Result Value Ref Range Status   SARS Coronavirus 2 by RT  PCR POSITIVE (A) NEGATIVE Final    Comment: (NOTE) SARS-CoV-2 target nucleic acids are DETECTED.  The SARS-CoV-2 RNA is generally detectable in upper respiratory specimens during the acute phase of infection. Positive results are indicative of the presence of the identified virus, but do not rule out bacterial infection or co-infection with other pathogens not detected by the test. Clinical correlation with patient history and other diagnostic information is necessary to determine patient infection status. The expected result is Negative.  Fact Sheet for Patients: BloggerCourse.com  Fact Sheet for Healthcare Providers: SeriousBroker.it  This test is not yet approved or cleared by the [place] FDA and  has been authorized for detection and/or diagnosis of SARS-CoV-2 by FDA under an Emergency Use Authorization (EUA).  This EUA will remain in effect (meaning this test can be used) for the duration of  the COVID-19 declaration under Section 564(b)(1) of the A ct, 21 U.S.C. section 360bbb-3(b)(1), unless the authorization is terminated or revoked sooner.     Influenza A by PCR NEGATIVE NEGATIVE Final   Influenza B by PCR NEGATIVE NEGATIVE Final    Comment: (NOTE) The Xpert Xpress SARS-CoV-2/FLU/RSV plus assay is intended as an aid in the diagnosis of influenza from Nasopharyngeal swab specimens and should not be used as a sole basis for treatment. Nasal washings and aspirates are unacceptable for Xpert Xpress SARS-CoV-2/FLU/RSV testing.  Fact Sheet for Patients: BloggerCourse.com  Fact Sheet for Healthcare  Providers: SeriousBroker.it  This test is not yet approved or cleared by the [place] FDA and has been authorized for detection and/or diagnosis of SARS-CoV-2 by FDA under an Emergency Use Authorization (EUA). This EUA will remain in effect (meaning this test can be used) for the duration of the COVID-19 declaration under Section 564(b)(1) of the Act, 21 U.S.C. section 360bbb-3(b)(1), unless the authorization is terminated or revoked.  Performed at [facility], [address] [place], [place] [address]   Respiratory (~20 pathogens) panel by PCR     Status: None   Collection Time: 04/03/23  4:30 PM  Result Value Ref Range Status   Adenovirus NOT DETECTED NOT DETECTED Final   Coronavirus 229E NOT DETECTED NOT DETECTED Final    Comment: (NOTE) The Coronavirus on the Respiratory Panel, DOES NOT test for the novel  Coronavirus (2019 nCoV)    Coronavirus HKU1 NOT DETECTED NOT DETECTED Final   Coronavirus NL63 NOT DETECTED NOT DETECTED Final   Coronavirus OC43 NOT DETECTED NOT DETECTED Final   Metapneumovirus NOT DETECTED NOT DETECTED Final   Rhinovirus / Enterovirus NOT DETECTED NOT DETECTED Final   Influenza A NOT DETECTED NOT DETECTED Final   Influenza B NOT DETECTED NOT DETECTED Final   Parainfluenza Virus 1 NOT DETECTED NOT DETECTED Final   Parainfluenza Virus 2 NOT DETECTED NOT DETECTED Final   Parainfluenza Virus 3 NOT DETECTED NOT DETECTED Final   Parainfluenza Virus 4 NOT DETECTED NOT DETECTED Final   Respiratory Syncytial Virus NOT DETECTED NOT DETECTED Final   Bordetella pertussis NOT DETECTED NOT DETECTED Final   Bordetella Parapertussis NOT DETECTED NOT DETECTED Final   Chlamydophila pneumoniae NOT DETECTED NOT DETECTED Final   Mycoplasma pneumoniae NOT DETECTED NOT DETECTED Final    Comment: Performed at [facility], [address], [place], [place] [address]   Imaging ECHOCARDIOGRAM COMPLETE  Result Date: 04/04/2023     ECHOCARDIOGRAM REPORT   Patient Name:   MARELL HIRANI Date of Exam: 04/04/2023 Medical Rec #:  [id number]  Height:       66.0 in Accession #:    [id number]         Weight:       191.8 lb Date of Birth:  1956/02/29           BSA:          1.965 m Patient Age:    67 years           BP:           142/83 mmHg Patient Gender: M                  HR:           79 bpm. Exam Location:  [facility] Procedure: 2D Echo and Intracardiac Opacification Agent Indications:     CHEST PAIN R07.9                  DYSPNEA R06.00  History:         Patient has prior history of Echocardiogram examinations, most                  recent 08/26/2022.  Sonographer:     Overton Mam RDCS, FASE Referring Phys:  [id number] AMY N [facility] Diagnosing Phys: Julien Nordmann MD  Sonographer Comments: Technically challenging study due to limited acoustic windows, no parasternal window and no apical window. Image acquisition challenging due to respiratory motion. IMPRESSIONS  1. Left ventricular ejection fraction, by estimation, is 55 to 60%. The left ventricle has normal function. The left ventricle has no regional wall motion abnormalities. Left ventricular diastolic parameters are indeterminate.  2. Right ventricular systolic function is normal. The right ventricular size is normal. Tricuspid regurgitation signal is inadequate for assessing PA pressure.  3. The mitral valve is normal in structure. No evidence of mitral valve regurgitation. No evidence of mitral stenosis.  4. The aortic valve has an indeterminant number of cusps. Aortic valve regurgitation is not visualized. Aortic valve sclerosis is present, with no evidence of aortic valve stenosis.  5. The inferior vena cava is normal in size with greater than 50% respiratory variability, suggesting right atrial pressure of 3 mmHg. FINDINGS  Left Ventricle: Left ventricular ejection fraction, by estimation, is 55 to 60%. The left ventricle has normal function. The left ventricle has no regional wall  motion abnormalities. Definity contrast agent was given IV to delineate the left ventricular  endocardial borders. The left ventricular internal cavity size was normal in size. There is no left ventricular hypertrophy. Left ventricular diastolic parameters are indeterminate. Right Ventricle: The right ventricular size is normal. No increase in right ventricular wall thickness. Right ventricular systolic function is normal. Tricuspid regurgitation signal is inadequate for assessing PA pressure. Left Atrium: Left atrial size was normal in size. Right Atrium: Right atrial size was normal in size. Pericardium: There is no evidence of pericardial effusion. Mitral Valve: The mitral valve is normal in structure. No evidence of mitral valve regurgitation. No evidence of mitral valve stenosis. Tricuspid Valve: The tricuspid valve is normal in structure. Tricuspid valve regurgitation is not demonstrated. No evidence of tricuspid stenosis. Aortic Valve: The aortic valve has an indeterminant number of cusps. Aortic valve regurgitation is not visualized. Aortic valve sclerosis is present, with no evidence of aortic valve stenosis. Pulmonic Valve: The pulmonic valve was normal in structure. Pulmonic valve regurgitation is not visualized. No evidence of pulmonic stenosis. Aorta: The aortic root is normal in size and structure. Venous: The inferior vena cava  is normal in size with greater than 50% respiratory variability, suggesting right atrial pressure of 3 mmHg. IAS/Shunts: No atrial level shunt detected by color flow Doppler.  LEFT VENTRICLE PLAX 2D LVIDd:         4.70 cm LVIDs:         3.30 cm LV PW:         1.00 cm LV IVS:        1.00 cm LVOT diam:     2.10 cm LVOT Area:     3.46 cm  LEFT ATRIUM         Index LA diam:    2.70 cm 1.37 cm/m   AORTA Ao Root diam: 4.00 cm  SHUNTS Systemic Diam: 2.10 cm Julien Nordmann MD Electronically signed by Julien Nordmann MD Signature Date/Time: 04/04/2023/10:37:17 AM    Final        Time coordinating discharge: over 30 minutes  SIGNED:  Sunnie Nielsen DO [facility]

## 2023-04-12 ENCOUNTER — Inpatient Hospital Stay: Payer: Medicare HMO | Attending: Internal Medicine | Admitting: Internal Medicine

## 2023-04-12 ENCOUNTER — Encounter: Payer: Self-pay | Admitting: *Deleted

## 2023-04-12 ENCOUNTER — Inpatient Hospital Stay: Payer: Medicare HMO

## 2023-04-12 VITALS — BP 109/62 | HR 83 | Temp 99.6°F | Wt 210.4 lb

## 2023-04-12 DIAGNOSIS — Z955 Presence of coronary angioplasty implant and graft: Secondary | ICD-10-CM | POA: Diagnosis not present

## 2023-04-12 DIAGNOSIS — I2510 Atherosclerotic heart disease of native coronary artery without angina pectoris: Secondary | ICD-10-CM | POA: Diagnosis not present

## 2023-04-12 DIAGNOSIS — J4489 Other specified chronic obstructive pulmonary disease: Secondary | ICD-10-CM | POA: Diagnosis not present

## 2023-04-12 DIAGNOSIS — J9610 Chronic respiratory failure, unspecified whether with hypoxia or hypercapnia: Secondary | ICD-10-CM | POA: Insufficient documentation

## 2023-04-12 DIAGNOSIS — E871 Hypo-osmolality and hyponatremia: Secondary | ICD-10-CM | POA: Diagnosis not present

## 2023-04-12 DIAGNOSIS — K219 Gastro-esophageal reflux disease without esophagitis: Secondary | ICD-10-CM | POA: Diagnosis not present

## 2023-04-12 DIAGNOSIS — I10 Essential (primary) hypertension: Secondary | ICD-10-CM | POA: Diagnosis not present

## 2023-04-12 DIAGNOSIS — Z79899 Other long term (current) drug therapy: Secondary | ICD-10-CM | POA: Insufficient documentation

## 2023-04-12 DIAGNOSIS — N400 Enlarged prostate without lower urinary tract symptoms: Secondary | ICD-10-CM | POA: Diagnosis not present

## 2023-04-12 DIAGNOSIS — Z7982 Long term (current) use of aspirin: Secondary | ICD-10-CM | POA: Insufficient documentation

## 2023-04-12 DIAGNOSIS — E785 Hyperlipidemia, unspecified: Secondary | ICD-10-CM | POA: Diagnosis not present

## 2023-04-12 DIAGNOSIS — R590 Localized enlarged lymph nodes: Secondary | ICD-10-CM

## 2023-04-12 DIAGNOSIS — Z7951 Long term (current) use of inhaled steroids: Secondary | ICD-10-CM | POA: Insufficient documentation

## 2023-04-12 DIAGNOSIS — Z7902 Long term (current) use of antithrombotics/antiplatelets: Secondary | ICD-10-CM | POA: Diagnosis not present

## 2023-04-12 DIAGNOSIS — J84112 Idiopathic pulmonary fibrosis: Secondary | ICD-10-CM | POA: Insufficient documentation

## 2023-04-12 DIAGNOSIS — R918 Other nonspecific abnormal finding of lung field: Secondary | ICD-10-CM | POA: Diagnosis present

## 2023-04-12 DIAGNOSIS — Z87891 Personal history of nicotine dependence: Secondary | ICD-10-CM | POA: Insufficient documentation

## 2023-04-12 NOTE — Progress Notes (Signed)
Austin Patterson CONSULT NOTE  Patient Care Team: Gildardo Pounds, PA as PCP - General (Physician Assistant) Debbe Odea, MD as PCP - Cardiology (Cardiology) Salena Saner, MD as Consulting Physician (Pulmonary Disease) Glory Buff, RN as Oncology Nurse Navigator  REFERRING PROVIDER: Dr. Lyn Hollingshead  REASON FOR REFFERAL: Right lung mass  CANCER STAGING   Cancer Staging  No matching staging information was found for the patient.  ASSESSMENT & PLAN:  Austin Patterson 67 y.o. 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  # Right mid lung mass # Right lower lung nodules # Enlarged mediastinal and hilar adenopathy # Chronic respiratory failure on 2 L oxygen # Asthma and COPD overlap # Idiopathic pulmonary fibrosis  -Patient presented to ED on 04/05/2023 for shortness of breath and chest pressure.  CTA chest showed new 6.8 x 4.9 cm soft tissue mass in the anterior right midlung field.  10 mm nodule in the right lower lung field.  1.8 cm nodule in the right lower lung field.  Large bulla and emphysema present.  Enlarged lymph nodes in the mediastinum measuring 2.1 x 1.9 cm right hilum 2.8 x 1.8 cm.  2.1 cm nodule in right adrenal.  1.8 cm nodule in the left adrenal suggestive of adenomas.  No evidence of PE. CT abdomen showed no evidence of metastatic disease.  -He had CT abdomen pelvis in February 2024.  He did have 1.8 cm nodule in the right lower lung field given then.  -I discussed the imaging findings with the patient.  Will schedule him for PET/CT scan and MRI brain  with and without contrast to complete the staging.  I have discussed the case with Dr. Jayme Cloud of pulmonary.  She will schedule a follow-up visit after the scans are available.  He is too high risk for EBUS procedure due to his chronic respiratory failure.  # Hyponatremia -Chronic in nature.  Sodium 129 at baseline.   Orders Placed This Encounter  Procedures   NM PET Image Initial (PI) Skull Base To Thigh    Standing Status:   Future    Standing Expiration Date:   04/11/2024    Order Specific Question:   If indicated for the ordered procedure, I authorize the administration of a radiopharmaceutical per Radiology protocol    Answer:   Yes    Order Specific Question:   Preferred imaging location?    Answer:   [facility]   MR Brain W Wo Contrast    Standing Status:   Future    Standing Expiration Date:   04/11/2024    Order Specific Question:   If indicated for the ordered procedure, I authorize the administration of contrast media per Radiology protocol    Answer:   Yes    Order Specific Question:   What is the patient's sedation requirement?    Answer:   No Sedation    Order Specific Question:   Does the patient have a pacemaker or implanted devices?    Answer:   No    Order Specific Question:   Use SRS Protocol?    Answer:   No    Order Specific  Question:   Preferred imaging location?    Answer:   [facility] (table limit - 550lbs)   RTC in 3 weeks for MD visit to finalize the treatment plan  The total time spent in the appointment was 55 minutes encounter with patients including review of chart and various tests results, discussions about plan of care and coordination of care plan   All questions were answered. The patient knows to call the clinic with any problems, questions or concerns. No barriers to learning was detected.  Michaelyn Barter, MD 6/25/202410:27 AM   HISTORY OF PRESENTING ILLNESS:  Austin Patterson 67 y.o. 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  CTA chest showed new 6.8 x 4.9 cm soft tissue mass in the anterior right midlung field.  10 mm nodule in the right lower lung field.  1.8 cm nodule in the right lower lung field.  Large bulla and emphysema present.  Enlarged lymph nodes in the mediastinum measuring 2.1 x 1.9 cm right hilum 2.8 x 1.8 cm.  2.1 cm nodule in right adrenal.  1.8 cm nodule in the left adrenal suggestive of adenomas.  No evidence of PE.  CT abdomen showed no evidence of metastatic disease.  Patient was seen today in the clinic for further evaluation.  He is a remote smoker quit in 2016.  Prior smoked 2 packs a day since age 44.  He is on 2 L oxygen since 2017.  Lives alone.  Was in a wheelchair.  Does not have family close by.  Follows with Dr. Jayme Cloud.   I have reviewed his chart and materials related to his cancer extensively and collaborated history with the patient. Summary of oncologic history is as follows: Oncology History   No history exists.    MEDICAL HISTORY:  Past Medical History:  Diagnosis Date   Abdominal pain 12/07/2022   Acute urinary retention 12/07/2022   COPD (chronic obstructive pulmonary disease) (HCC)    GERD (gastroesophageal reflux disease)    Hyperlipidemia    Hypertension    Pulmonary fibrosis (HCC) 11/2015    SURGICAL HISTORY: Past Surgical History:  Procedure Laterality Date   COLONOSCOPY     CORONARY STENT PLACEMENT     ESOPHAGOGASTRODUODENOSCOPY (EGD) WITH PROPOFOL N/A 09/23/2016   Procedure: ESOPHAGOGASTRODUODENOSCOPY (EGD) WITH PROPOFOL;  Surgeon: Wyline Mood, MD;   Location: [facility] ENDOSCOPY;  Service: Endoscopy;  Laterality: N/A;   KYPHOPLASTY N/A 03/14/2020   Procedure: T7 & T11 KYPHOPLASTY;  Surgeon: Kennedy Bucker, MD;  Location: [facility] ORS;  Service: Orthopedics;  Laterality: N/A;   SHOULDER ACROMIOPLASTY      SOCIAL HISTORY: Social History   Socioeconomic History   Marital status: Legally Separated    Spouse name: Not on file   Number of children: Not on file   Years of education: Not on file   Highest education level: Not on file  Occupational History   Not on file  Tobacco Use   Smoking status: Former    Packs/day: 2.00    Years: 50.00    Additional pack years: 0.00    Total pack years: 100.00    Types: Cigarettes    Quit date:  10/14/2015    Years since quitting: 7.5   Smokeless tobacco: Former  Building services engineer Use: Never used  Substance and Sexual Activity   Alcohol use: Yes    Alcohol/week: 56.0 standard drinks of alcohol    Types: 56 Cans of beer per week    Comment: "I sit around and drink beer, that's all I got to do"   Drug use: No   Sexual activity: Not Currently  Other Topics Concern   Not on file  Social History Narrative   Not on file   Social Determinants of Health   Financial Resource Strain: Not on file  Food Insecurity: No Food Insecurity (04/03/2023)   Hunger Vital Sign    Worried About Running Out of Food in the Last Year: Never true    Ran Out of Food in the Last Year: Never true  Transportation Needs: No Transportation Needs (04/03/2023)   PRAPARE - Administrator, Civil Service (Medical): No    Lack of Transportation (Non-Medical): No  Physical Activity: Not on file  Stress: Not on file  Social Connections: Not on file  Intimate Partner Violence: Not At Risk (04/03/2023)   Humiliation, Afraid, Rape, and Kick questionnaire    Fear of Current or Ex-Partner: No    Emotionally Abused: No    Physically Abused: No    Sexually Abused: No    FAMILY HISTORY: Family History  Problem  Relation Age of Onset   Heart disease Mother     ALLERGIES:  is allergic to amoxicillin and tizanidine.  MEDICATIONS:  Current Outpatient Medications  Medication Sig Dispense Refill   acetaminophen (TYLENOL) 500 MG tablet Take 1,000 mg by mouth every 6 (six) hours as needed for moderate pain or headache.     albuterol (VENTOLIN HFA) 108 (90 Base) MCG/ACT inhaler Inhale 2 puffs into the lungs every 6 (six) hours as needed for wheezing or shortness of breath. 8 g 11   amLODipine (NORVASC) 10 MG tablet Take 1 tablet (10 mg total) by mouth daily. 90 tablet 3   aspirin EC 81 MG tablet Take 1 tablet (81 mg total) by mouth daily. 90 tablet 3   atorvastatin (LIPITOR) 40 MG tablet Take 1 tablet (40 mg total) by mouth at bedtime. 90 tablet 3   CALCIUM 600/VITAMIN D 600-10 MG-MCG TABS Take 1 tablet by mouth 2 (two) times daily.     chlorpheniramine-HYDROcodone (TUSSIONEX) 10-8 MG/5ML Take 5 mLs by mouth at bedtime as needed for cough. 70 mL 0   clopidogrel (PLAVIX) 75 MG tablet Take 1 tablet (75 mg total) by mouth daily. 90 tablet 3   Dupilumab (DUPIXENT) 300 MG/2ML SOPN Inject 300 mg into the skin every 14 (fourteen) days. 12 mL 1   Ensure Max Protein (ENSURE MAX PROTEIN) LIQD Take 330 mLs (11 oz total) by mouth 2 (two) times daily.     fluticasone-salmeterol (ADVAIR DISKUS) 250-50 MCG/ACT AEPB Inhale 1 puff into the lungs in the morning and at bedtime. 60 each 11   gabapentin (NEURONTIN) 300 MG capsule Take 1 capsule (300 mg total) by mouth 3 (three) times daily. 90 capsule 0   ipratropium-albuterol (DUONEB) 0.5-2.5 (3) MG/3ML SOLN INHALE 1 VIAL THROUGH NEBULIZER EVERY 6 HOURS 270 mL 2   KLOR-CON M20 20 MEQ tablet Take 20 mEq by mouth daily.     losartan-hydrochlorothiazide (HYZAAR) 100-25 MG tablet Take 1 tablet by mouth daily.     magnesium oxide (MAG-OX) 400 (241.3 Mg) MG tablet Take  1 tablet (400 mg total) by mouth daily. 30 tablet 0   metoprolol succinate (TOPROL-XL) 50 MG 24 hr tablet Take 1  tablet (50 mg total) by mouth daily. 90 tablet 3   montelukast (SINGULAIR) 10 MG tablet Take 10 mg by mouth daily.     Multiple Vitamin (MULTIVITAMIN WITH MINERALS) TABS tablet Take 1 tablet by mouth daily. 30 tablet 0   pantoprazole (PROTONIX) 40 MG tablet Take 1 tablet (40 mg total) by mouth 2 (two) times daily. 90 tablet 3   senna-docusate (SENOKOT-S) 8.6-50 MG tablet Take 1 tablet by mouth 2 (two) times daily. 30 tablet 1   Spacer/Aero-Holding Chambers (AEROCHAMBER MV) inhaler Use as instructed 1 each 0   tamsulosin (FLOMAX) 0.4 MG CAPS capsule Take 1 capsule (0.4 mg total) by mouth daily after supper. 30 capsule 2   EPINEPHrine 0.3 mg/0.3 mL IJ SOAJ injection Inject 0.3 mg into the muscle as needed for anaphylaxis. (Patient not taking: Reported on 07/20/2022)     No current facility-administered medications for this visit.    REVIEW OF SYSTEMS:   Pertinent information mentioned in HPI All other systems were reviewed with the patient and are negative.  PHYSICAL EXAMINATION: ECOG PERFORMANCE STATUS: 2 - Symptomatic, <50% confined to bed   Vitals:   04/12/23 1409  BP: 109/62  Pulse: 83  Temp: 99.6 F (37.6 C)  SpO2: 93%   Filed Weights   04/12/23 1409  Weight: 210 lb 6.4 oz (95.4 kg)    GENERAL:alert, no distress and comfortable SKIN: skin color, texture, turgor are normal, no rashes or significant lesions EYES: normal, conjunctiva are pink and non-injected, sclera clear OROPHARYNX:no exudate, no erythema and lips, buccal mucosa, and tongue normal  NECK: supple, thyroid normal size, non-tender, without nodularity LYMPH:  no palpable lymphadenopathy in the cervical, axillary or inguinal LUNGS: clear to auscultation and percussion with normal breathing effort HEART: regular rate & rhythm and no murmurs and no lower extremity edema ABDOMEN:abdomen soft, non-tender and normal bowel sounds Musculoskeletal:no cyanosis of digits and no clubbing  PSYCH: alert & oriented x 3 with  fluent speech NEURO: no focal motor/sensory deficits  LABORATORY DATA:  I have reviewed the data as listed Lab Results  Component Value Date   WBC 3.3 (L) 04/04/2023   HGB 10.2 (L) 04/04/2023   HCT 31.8 (L) 04/04/2023   MCV 83.9 04/04/2023   PLT 322 04/04/2023   Recent Labs    06/15/22 1219 12/07/22 1019 12/08/22 0239 12/09/22 0450 04/03/23 1148 04/04/23 0537  NA 130* 126*   < > 133* 125* 129*  K 4.0 3.9   < > 4.1 3.9 3.7  CL 92* 88*   < > 95* 86* 89*  CO2 25 28   < > 28 26 28   GLUCOSE 115* 125*   < > 120* 121* 136*  BUN 13 12   < > 18 11 15   CREATININE 0.71 0.65   < > 0.72 0.69 0.72  CALCIUM 9.1 9.0   < > 8.6* 8.7* 8.9  GFRNONAA >60 >60   < > >60 >60 >60  PROT 7.4 7.4  --   --   --   --   ALBUMIN 4.6 4.5  --   --   --   --   AST 28 28  --   --   --   --   ALT 23 22  --   --   --   --   ALKPHOS 39 68  --   --   --   --  BILITOT 0.9 0.6  --   --   --   --    < > = values in this interval not displayed.    RADIOGRAPHIC STUDIES: I have personally reviewed the radiological images as listed and agreed with the findings in the report. ECHOCARDIOGRAM COMPLETE  Result Date: 04/04/2023    ECHOCARDIOGRAM REPORT   Patient Name:   SELMER ADDUCI Date of Exam: 04/04/2023 Medical Rec #:  [id number]          Height:       66.0 in Accession #:    [id number]         Weight:       191.8 lb Date of Birth:  30-Dec-1955           BSA:          1.965 m Patient Age:    67 years           BP:           142/83 mmHg Patient Gender: M                  HR:           79 bpm. Exam Location:  [facility] Procedure: 2D Echo and Intracardiac Opacification Agent Indications:     CHEST PAIN R07.9                  DYSPNEA R06.00  History:         Patient has prior history of Echocardiogram examinations, most                  recent 08/26/2022.  Sonographer:     Overton Mam RDCS, FASE Referring Phys:  [id number] AMY N COX Diagnosing Phys: Julien Nordmann MD  Sonographer Comments: Technically challenging study due to  limited acoustic windows, no parasternal window and no apical window. Image acquisition challenging due to respiratory motion. IMPRESSIONS  1. Left ventricular ejection fraction, by estimation, is 55 to 60%. The left ventricle has normal function. The left ventricle has no regional wall motion abnormalities. Left ventricular diastolic parameters are indeterminate.  2. Right ventricular systolic function is normal. The right ventricular size is normal. Tricuspid regurgitation signal is inadequate for assessing PA pressure.  3. The mitral valve is normal in structure. No evidence of mitral valve regurgitation. No evidence of mitral stenosis.  4. The aortic valve has an indeterminant number of cusps. Aortic valve regurgitation is not visualized. Aortic valve sclerosis is present, with no evidence of aortic valve stenosis.  5. The inferior vena cava is normal in size with greater than 50% respiratory variability, suggesting right atrial pressure of 3 mmHg. FINDINGS  Left Ventricle: Left ventricular ejection fraction, by estimation, is 55 to 60%. The left ventricle has normal function. The left ventricle has no regional wall motion abnormalities. Definity contrast agent was given IV to delineate the left ventricular  endocardial borders. The left ventricular internal cavity size was normal in size. There is no left ventricular hypertrophy. Left ventricular diastolic parameters are indeterminate. Right Ventricle: The right ventricular size is normal. No increase in right ventricular wall thickness. Right ventricular systolic function is normal. Tricuspid regurgitation signal is inadequate for assessing PA pressure. Left Atrium: Left atrial size was normal in size. Right Atrium: Right atrial size was normal in size. Pericardium: There is no evidence of pericardial effusion. Mitral Valve: The mitral valve is normal in structure. No evidence of mitral valve regurgitation. No evidence of mitral  valve stenosis. Tricuspid  Valve: The tricuspid valve is normal in structure. Tricuspid valve regurgitation is not demonstrated. No evidence of tricuspid stenosis. Aortic Valve: The aortic valve has an indeterminant number of cusps. Aortic valve regurgitation is not visualized. Aortic valve sclerosis is present, with no evidence of aortic valve stenosis. Pulmonic Valve: The pulmonic valve was normal in structure. Pulmonic valve regurgitation is not visualized. No evidence of pulmonic stenosis. Aorta: The aortic root is normal in size and structure. Venous: The inferior vena cava is normal in size with greater than 50% respiratory variability, suggesting right atrial pressure of 3 mmHg. IAS/Shunts: No atrial level shunt detected by color flow Doppler.  LEFT VENTRICLE PLAX 2D LVIDd:         4.70 cm LVIDs:         3.30 cm LV PW:         1.00 cm LV IVS:        1.00 cm LVOT diam:     2.10 cm LVOT Area:     3.46 cm  LEFT ATRIUM         Index LA diam:    2.70 cm 1.37 cm/m   AORTA Ao Root diam: 4.00 cm  SHUNTS Systemic Diam: 2.10 cm Julien Nordmann MD Electronically signed by Julien Nordmann MD Signature Date/Time: 04/04/2023/10:37:17 AM    Final    CT Angio Chest PE W and/or Wo Contrast  Result Date: 04/03/2023 CLINICAL DATA:  High clinical suspicion for PE EXAM: CT ANGIOGRAPHY CHEST WITH CONTRAST TECHNIQUE: Multidetector CT imaging of the chest was performed using the standard protocol during bolus administration of intravenous contrast. Multiplanar CT image reconstructions and MIPs were obtained to evaluate the vascular anatomy. RADIATION DOSE REDUCTION: This exam was performed according to the departmental dose-optimization program which includes automated exposure control, adjustment of the mA and/or kV according to patient size and/or use of iterative reconstruction technique. CONTRAST:  75mL OMNIPAQUE IOHEXOL 350 MG/ML SOLN COMPARISON:  02/09/2020 FINDINGS: Cardiovascular: There is homogeneous enhancement in thoracic aorta. There are  scattered atherosclerotic plaques and calcifications in thoracic aorta and its major branches. There are no intraluminal filling defects in pulmonary artery branches. Coronary artery calcifications are seen. Mediastinum/Nodes: There are enlarged lymph nodes in mediastinum measuring up to 2.1 x 1.9 cm in precarinal region. There is enlarged lymph node in right hilum measuring 2.8 x 1.8 cm. Lungs/Pleura: There is a new 6.8 x 4.9 cm soft tissue mass in the anterior right mid lung field. In image 108 of series 5, there is 10 mm nodule in right lower lung field. In image 114, there is 1.8 cm nodule in right lower lung field. Large bulla is seen in lateral aspect of right lung. Centrilobular and panlobular emphysema is seen. There is no pleural effusion or pneumothorax. Upper Abdomen: There is 2.1 cm nodule in right adrenal. There is 1.8 cm nodule in left adrenal. Density measurements are less than 0 Hounsfield units suggesting adenomas. Musculoskeletal: There is interval vertebroplasty in the bodies of T7 and T11 vertebrae. Old compression fractures are seen in T7 and T11 vertebrae. Review of the MIP images confirms the above findings. IMPRESSION: There is no evidence of pulmonary artery embolism. There is no evidence of thoracic aortic dissection. Aortic atherosclerosis. Coronary artery calcifications are seen. There is new 6.8 cm mass lesion in the anterior right midlung field suggesting possible malignant neoplasm. There are 2 small nodular densities in right lower lung field suggesting possible metastatic disease. There are abnormally enlarged lymph  nodes in right hilum and mediastinum suggesting possible metastatic lymphadenopathy. Follow-up PET-CT and tissue sampling as clinically warranted should be considered. There are low-density nodules in both adrenals suggesting adenomas. Old compression fractures are seen in bodies of T7 and T11 vertebrae with interval vertebroplasty. Electronically Signed   By: Ernie Avena M.D.   On: 04/03/2023 14:30   CT Renal Stone Study  Result Date: 04/03/2023 CLINICAL DATA:  Bladder-neck obstruction frequent, small volume urination for several days EXAM: CT ABDOMEN AND PELVIS WITHOUT CONTRAST TECHNIQUE: Multidetector CT imaging of the abdomen and pelvis was performed following the standard protocol without IV contrast. RADIATION DOSE REDUCTION: This exam was performed according to the departmental dose-optimization program which includes automated exposure control, adjustment of the mA and/or kV according to patient size and/or use of iterative reconstruction technique. COMPARISON:  12/07/2022 FINDINGS: Lower chest: Advanced emphysematous changes within the lung bases. Two enlarging right lower lobe pulmonary nodules (series 4, images 10 and 19). See dedicated CT chest for full detail. Hepatobiliary: Unremarkable unenhanced appearance of the liver. No focal liver lesion identified. Gallbladder within normal limits. No hyperdense gallstone. No biliary dilatation. Pancreas: Unremarkable. No pancreatic ductal dilatation or surrounding inflammatory changes. Spleen: Normal in size without focal abnormality. Adrenals/Urinary Tract: Stable bilateral benign adrenal adenomas which do not require follow-up imaging. Cortical scarring of the lower pole of the right kidney. Atherosclerotic calcification within the bilateral renal hila. Kidneys appear otherwise within normal limits. No renal stone or hydronephrosis. Urinary bladder is within normal limits. Stomach/Bowel: Stomach is within normal limits. Scattered colonic diverticulosis. No evidence of bowel wall thickening, distention, or inflammatory changes. Vascular/Lymphatic: Advanced aortoiliac atherosclerotic calcification. No aneurysm. No abdominopelvic lymphadenopathy. Reproductive: Prostate is unremarkable. Other: No free fluid. No abdominopelvic fluid collection. No pneumoperitoneum. Small fat containing umbilical hernia.  Musculoskeletal: No acute or significant osseous findings. IMPRESSION: 1. No acute abdominopelvic findings. No evidence of obstructive uropathy. 2. Two enlarging right lower lobe pulmonary nodules, suspicious for malignancy. See dedicated CT chest for full detail. 3. Colonic diverticulosis without evidence of acute diverticulitis. 4. Aortic atherosclerosis (ICD10-I70.0). Electronically Signed   By: Duanne Guess D.O.   On: 04/03/2023 14:27   DG Chest 2 View  Result Date: 04/03/2023 CLINICAL DATA:  Shortness of breath EXAM: CHEST - 2 VIEW COMPARISON:  X-ray 12/07/2022 FINDINGS: Rounded masslike area in the right lung base. This would have a differential including aggressive lesion. Recommend follow up chest CT to further delineate. Left midlung scar or atelectasis. No pneumothorax or separate consolidation. No joint effusion. Normal cardiopericardial silhouette. Degenerative changes of the spine with the areas of compression and augmentation cement, similar to previous IMPRESSION: Developing rounded masslike opacity at the right lung base measuring 7 cm. An aggressive lesion is possible and recommend follow-up contrast chest CT when appropriate Electronically Signed   By: Karen Kays M.D.   On: 04/03/2023 12:11

## 2023-04-12 NOTE — Progress Notes (Signed)
Met with patient during initial consult with Dr. Alena Bills. All questions answered during visit. Reviewed upcoming appts with patient. Contact info given. Instructed to call with any questions or needs. Pt verbalized understanding. Nothing further needed at this time.

## 2023-04-13 DIAGNOSIS — R590 Localized enlarged lymph nodes: Secondary | ICD-10-CM | POA: Insufficient documentation

## 2023-04-19 ENCOUNTER — Ambulatory Visit
Admission: RE | Admit: 2023-04-19 | Discharge: 2023-04-19 | Disposition: A | Discharge status: Home / Self Care | Payer: Medicare HMO | Source: Ambulatory Visit | Attending: Internal Medicine | Admitting: Internal Medicine

## 2023-04-19 DIAGNOSIS — C342 Malignant neoplasm of middle lobe, bronchus or lung: Secondary | ICD-10-CM | POA: Diagnosis not present

## 2023-04-19 DIAGNOSIS — I700 Atherosclerosis of aorta: Secondary | ICD-10-CM | POA: Insufficient documentation

## 2023-04-19 DIAGNOSIS — N400 Enlarged prostate without lower urinary tract symptoms: Secondary | ICD-10-CM | POA: Diagnosis not present

## 2023-04-19 DIAGNOSIS — J439 Emphysema, unspecified: Secondary | ICD-10-CM | POA: Diagnosis not present

## 2023-04-19 DIAGNOSIS — I2510 Atherosclerotic heart disease of native coronary artery without angina pectoris: Secondary | ICD-10-CM | POA: Insufficient documentation

## 2023-04-19 DIAGNOSIS — C778 Secondary and unspecified malignant neoplasm of lymph nodes of multiple regions: Secondary | ICD-10-CM | POA: Diagnosis not present

## 2023-04-19 DIAGNOSIS — R918 Other nonspecific abnormal finding of lung field: Secondary | ICD-10-CM

## 2023-04-19 LAB — GLUCOSE, CAPILLARY: Glucose-Capillary: 110 mg/dL — ABNORMAL HIGH (ref 70–99)

## 2023-04-19 MED ORDER — FLUDEOXYGLUCOSE F - 18 (FDG) INJECTION
10.9000 | Freq: Once | INTRAVENOUS | Status: AC | PRN
  Administered 2023-04-19: 11.51 via INTRAVENOUS

## 2023-04-20 ENCOUNTER — Ambulatory Visit
Admission: RE | Admit: 2023-04-20 | Discharge: 2023-04-20 | Disposition: A | Discharge status: Home / Self Care | Payer: Medicare HMO | Source: Ambulatory Visit | Attending: Internal Medicine | Admitting: Internal Medicine

## 2023-04-20 DIAGNOSIS — R918 Other nonspecific abnormal finding of lung field: Secondary | ICD-10-CM | POA: Insufficient documentation

## 2023-04-20 MED ORDER — GADOBUTROL 1 MMOL/ML IV SOLN
10.0000 mL | Freq: Once | INTRAVENOUS | Status: AC | PRN
  Administered 2023-04-20: 10 mL via INTRAVENOUS

## 2023-04-27 ENCOUNTER — Ambulatory Visit: Payer: Medicare HMO | Admitting: Pulmonary Disease

## 2023-04-27 ENCOUNTER — Encounter: Payer: Self-pay | Admitting: Pulmonary Disease

## 2023-04-27 VITALS — BP 136/80 | HR 77 | Temp 98.1°F | Ht 66.0 in | Wt 210.0 lb

## 2023-04-27 DIAGNOSIS — R918 Other nonspecific abnormal finding of lung field: Secondary | ICD-10-CM | POA: Diagnosis not present

## 2023-04-27 DIAGNOSIS — J449 Chronic obstructive pulmonary disease, unspecified: Secondary | ICD-10-CM | POA: Diagnosis not present

## 2023-04-27 DIAGNOSIS — J9611 Chronic respiratory failure with hypoxia: Secondary | ICD-10-CM

## 2023-04-27 DIAGNOSIS — E8801 Alpha-1-antitrypsin deficiency: Secondary | ICD-10-CM

## 2023-04-27 DIAGNOSIS — J4550 Severe persistent asthma, uncomplicated: Secondary | ICD-10-CM

## 2023-04-27 MED ORDER — PREDNISONE 10 MG PO TABS: 10.0000 mg | ORAL_TABLET | Freq: Every day | ORAL | 1 refills | Status: DC | PRN

## 2023-04-27 MED ORDER — PREDNISONE 10 MG PO TABS: ORAL_TABLET | ORAL | 1 refills | Status: DC

## 2023-04-27 MED ORDER — GABAPENTIN 300 MG PO CAPS: 300.0000 mg | ORAL_CAPSULE | Freq: Three times a day (TID) | ORAL | 1 refills | Status: DC

## 2023-04-27 NOTE — Progress Notes (Unsigned)
Subjective:    Patient ID: Austin Patterson, male    DOB: [birth year], 67 y.o.   MRN: [id number]  Patient Care Team: Gildardo Pounds, PA as PCP - General (Physician Assistant) Debbe Odea, MD as PCP - Cardiology (Cardiology) Salena Saner, MD as Consulting Physician (Pulmonary Disease) Glory Buff, RN as Oncology Nurse Navigator  Chief Complaint  Patient presents with   Follow-up    DOE has increased. Occasional wheezing. Cough with tan sputum.   HPI Austin Patterson is a 67 year old former smoker (100 PY) with stage IV very severe COPD, severe persistent asthma and chronic respiratory failure with hypoxia who presents for follow-up and evaluation for newly diagnosed lung mass.  At baseline the patient is very sedentary.  This is due to debilitating dyspnea.  He is compliant with oxygen at 2 L/min 24/7.  He has a complex respiratory history with very severe asthma with IgE initially recorded at 1950 this was noted to be directed to Aspergillus fumigatus (ABPA).  He is also a heterozygous alpha-1 MZ phenotype with low normal alpha-1 levels.  He has been on Dupixent for management of his asthma component.  He was last admitted to [facility] in June 15 through June 17 for chest pain.  During the workup he was noted to have a large lung mass on CT angio chest after he was noted on chest x-ray (new finding).  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  Patient has been referred to oncology.  He is referred for discussion of next steps with regards to this lung mass.    He had PET/CT performed 19 April 2023 this showed the middle lobe mass to be 7.1 x 5.5 cm with an SUV max of 17.9 mediastinal and hilar hypermetabolic nodes with SUV max of 4.8 right hilar node 1.8 cm SUV max of 9.6.  There are lower lobe nodules that are viable uptake.  Patient also has hypermetabolic porta hepatis nodes.  Given the rapid manifestation of this suspect an aggressive neoplasm such as small cell cancer or  a poorly differentiated adenocarcinoma, this was discussed with the patient.  The patient understands that he is not a candidate for bronchoscopy due to his severe chronic respiratory failure.  The dominant mass however appears to be amenable for biopsy by IR.  The patient has been on Plavix and aspirin however he has been able to discontinue these previously for other procedures (kyphoplasty).  With regards to his respiratory status, he feels he is pretty much at baseline.  He is very limited with regards to his activities of daily living due to dyspnea.  He is pretty much sedentary.  He is compliant with oxygen therapy.  He is compliant with his respiratory medications.  He is on Dupixent for his severe asthma.  He has had some issues with peripheral neuropathy and was started on gabapentin during his hospital stay which he notes helps him.  He would like for me to refill this medication as he does not have a primary care provider at present.  He has been abstinent of cigarettes however he is drinking approximately 8 cans of beer per day.   Review of Systems A 10 point review of systems was performed and it is as noted above otherwise negative.   Patient Active Problem List   Diagnosis Date Noted   Mediastinal adenopathy 04/13/2023   Right lower lobe lung mass 04/03/2023   Cough 04/03/2023   Alcohol use 04/03/2023   Constipation 12/10/2022  Lung nodule seen on imaging study 12/07/2022   COPD exacerbation (HCC) 02/23/2022   GERD without esophagitis 02/23/2022   Chest pain 01/24/2022   Alpha-1-antitrypsin deficiency carrier 01/10/2021   Asthma 03/12/2020   Chronic respiratory failure with hypoxia (HCC) 03/12/2020   Hyperlipidemia    Asthma-COPD overlap syndrome    Bleeding nose    Alcohol abuse    RUQ abdominal pain    Anaphylaxis 11/01/2019   Hypotension 11/01/2019   CAD (coronary artery disease) 11/01/2019   H/O heart artery stent 11/01/2019   Acute on chronic respiratory  failure with hypoxia (HCC)    RUQ pain    Essential hypertension    IPF (idiopathic pulmonary fibrosis) (HCC) 08/30/2019   Hypomagnesemia 03/31/2017   Hiatal hernia    Reflux esophagitis    Hematemesis without nausea    Hyponatremia 09/21/2016   Multifocal pneumonia 12/09/2015    Social History   Tobacco Use   Smoking status: Former    Packs/day: 2.00    Years: 50.00    Additional pack years: 0.00    Total pack years: 100.00    Types: Cigarettes    Quit date: 10/14/2015    Years since quitting: 7.5   Smokeless tobacco: Former  Substance Use Topics   Alcohol use: Yes    Alcohol/week: 56.0 standard drinks of alcohol    Types: 56 Cans of beer per week    Comment: "I sit around and drink beer, that's all I got to do"    Allergies  Allergen Reactions   Amoxicillin Anaphylaxis   Tizanidine     Feet and ankle swell     Current Meds  Medication Sig   acetaminophen (TYLENOL) 500 MG tablet Take 1,000 mg by mouth every 6 (six) hours as needed for moderate pain or headache.   albuterol (VENTOLIN HFA) 108 (90 Base) MCG/ACT inhaler Inhale 2 puffs into the lungs every 6 (six) hours as needed for wheezing or shortness of breath.   amLODipine (NORVASC) 10 MG tablet Take 1 tablet (10 mg total) by mouth daily.   aspirin EC 81 MG tablet Take 1 tablet (81 mg total) by mouth daily.   atorvastatin (LIPITOR) 40 MG tablet Take 1 tablet (40 mg total) by mouth at bedtime.   CALCIUM 600/VITAMIN D 600-10 MG-MCG TABS Take 1 tablet by mouth 2 (two) times daily.   chlorpheniramine-HYDROcodone (TUSSIONEX) 10-8 MG/5ML Take 5 mLs by mouth at bedtime as needed for cough.   clopidogrel (PLAVIX) 75 MG tablet Take 1 tablet (75 mg total) by mouth daily.   Dupilumab (DUPIXENT) 300 MG/2ML SOPN Inject 300 mg into the skin every 14 (fourteen) days.   Ensure Max Protein (ENSURE MAX PROTEIN) LIQD Take 330 mLs (11 oz total) by mouth 2 (two) times daily.   EPINEPHrine 0.3 mg/0.3 mL IJ SOAJ injection Inject 0.3 mg  into the muscle as needed for anaphylaxis.   fluticasone-salmeterol (ADVAIR DISKUS) 250-50 MCG/ACT AEPB Inhale 1 puff into the lungs in the morning and at bedtime.   gabapentin (NEURONTIN) 300 MG capsule Take 1 capsule (300 mg total) by mouth 3 (three) times daily.   ipratropium-albuterol (DUONEB) 0.5-2.5 (3) MG/3ML SOLN INHALE 1 VIAL THROUGH NEBULIZER EVERY 6 HOURS   KLOR-CON M20 20 MEQ tablet Take 20 mEq by mouth daily.   losartan-hydrochlorothiazide (HYZAAR) 100-25 MG tablet Take 1 tablet by mouth daily.   magnesium oxide (MAG-OX) 400 (241.3 Mg) MG tablet Take 1 tablet (400 mg total) by mouth daily.   metoprolol succinate (TOPROL-XL) 50  MG 24 hr tablet Take 1 tablet (50 mg total) by mouth daily.   montelukast (SINGULAIR) 10 MG tablet Take 10 mg by mouth daily.   Multiple Vitamin (MULTIVITAMIN WITH MINERALS) TABS tablet Take 1 tablet by mouth daily.   pantoprazole (PROTONIX) 40 MG tablet Take 1 tablet (40 mg total) by mouth 2 (two) times daily.   senna-docusate (SENOKOT-S) 8.6-50 MG tablet Take 1 tablet by mouth 2 (two) times daily.   Spacer/Aero-Holding Chambers (AEROCHAMBER MV) inhaler Use as instructed   tamsulosin (FLOMAX) 0.4 MG CAPS capsule Take 1 capsule (0.4 mg total) by mouth daily after supper.    Immunization History  Administered Date(s) Administered   Influenza Inj Mdck Quad Pf 08/10/2019   Influenza Inj Mdck Quad With Preservative 07/18/2018   Influenza,inj,Quad PF,6+ Mos 10/15/2015, 08/31/2016   Influenza-Unspecified 08/10/2019, 07/19/2020, 07/19/2022   Moderna SARS-COV2 Booster Vaccination 10/23/2020   Moderna Sars-Covid-2 Vaccination 11/21/2019, 12/21/2019   Pneumococcal Polysaccharide-23 10/15/2015        Objective:     BP 136/80 (BP Location: Left Arm, Cuff Size: Normal)   Pulse 77   Temp 98.1 F (36.7 C)   Ht 5\' 6"  (1.676 m)   Wt 210 lb (95.3 kg) Comment: per last office note. patient in a wheelchair today  SpO2 91%   BMI 33.89 kg/m   SpO2: 91 % O2  Device: Nasal cannula O2 Flow Rate (L/min): 2 L/min O2 Type: Continuous O2  GENERAL: Chronically ill appearing gentleman, chronic use of accessories, presents in transport chair.  Wearing oxygen via nasal cannula.  No acute respiratory distress.  No plethora.  HEAD: Normocephalic, atraumatic. EYES: Pupils equal, round, reactive to light.  No scleral icterus. MOUTH: Teeth in poor repair, oral mucosa moist, no thrush. NECK: Supple. No thyromegaly. Trachea midline. No JVD.  No adenopathy. PULMONARY: Increased AP diameter, significant kyphosis. Distant breath sounds. Coarse breath sounds, no wheezes.  Positive Hoover's sign. CARDIOVASCULAR: S1 and S2. Regular rate and rhythm.  Distant heart tones no murmur appreciated. GASTROINTESTINAL: Protuberant abdomen otherwise benign. MUSCULOSKELETAL: No joint deformity, no clubbing, no edema. NEUROLOGIC: No overt focal deficit.  Speech is fluent.  Awake, alert. SKIN: Intact,warm,dry. PSYCH: Mood and behavior normal.  Representative image from CT performed 03 April 2023 showing a very large right middle lobe mass adjacent to a very large bulla:     Assessment & Plan:     ICD-10-CM   1. Mass of right lung  R91.8 IR Radiologist Eval & Mgmt   Appears to be a very aggressive/rapidly progressing tumor Referral to IR for biopsy    2. Stage 4 very severe COPD by GOLD classification (HCC)  J44.9    Relatively compensated Continue Advair and DuoNeb Continue as needed albuterol    3. Severe persistent asthma, unspecified whether complicated  J45.50    On Dupixent, continue same He does use short bursts of prednisone as needed    4. Chronic respiratory failure with hypoxia (HCC)  J96.11    Compliant with oxygen at 2 L/min Patient notes benefit of therapy    5. Heterozygous alpha 1-antitrypsin deficiency (HCC)  E88.01    Phenotype MZ Most recent level 111 mg/dl      Orders Placed This Encounter  Procedures   IR Radiologist Eval & Mgmt     Patient has severe COPD and cannot undergo general anesthesia for bronchoscopy.  Has an accessible mass on the right lung.  He is on Plavix and aspirin but can come off of these for the  procedure.    Standing Status:   Future    Standing Expiration Date:   04/26/2024    Order Specific Question:   Reason for Exam (SYMPTOM  OR DIAGNOSIS REQUIRED)    Answer:   RIGHT Lung mass    Order Specific Question:   Preferred Imaging Location?    Answer:   [facility]    Meds ordered this encounter  Medications   gabapentin (NEURONTIN) 300 MG capsule    Sig: Take 1 capsule (300 mg total) by mouth 3 (three) times daily.    Dispense:  90 capsule    Refill:  1   predniSONE (DELTASONE) 10 MG tablet    Sig: Take 1 tablet (10 mg total) by mouth daily as needed. Take as directed.    Dispense:  30 tablet    Refill:  1   We will schedule the patient for biopsy by IR of the dominant mass.  Will see the patient by the second week in August.  He is to contact us prior to that time should any new difficulties arise.    Gailen Shelter, MD Advanced Bronchoscopy P[facility]    *This note was dictated using voice recognition software/Dragon.  Despite best efforts to proofread, errors can occur which can change the meaning. Any transcriptional errors that result from this process are unintentional and may not be fully corrected at the time of dictation.

## 2023-04-27 NOTE — Patient Instructions (Addendum)
We are scheduling you for your biopsy.  We will see you in follow-up on the first or second week in August.

## 2023-04-28 ENCOUNTER — Telehealth: Payer: Self-pay | Admitting: Pulmonary Disease

## 2023-04-28 NOTE — Telephone Encounter (Signed)
Is asking about rx for gabapantin?States he mentioned it in appt

## 2023-04-28 NOTE — Telephone Encounter (Signed)
Gabapentin was sent to pharmacy during 04/27/2023 visit. Spoke to Nesco with [facility]. He stated that Rx was received but it was too soon to fill.   Patient is aware and voiced his understanding.  Nothing further needed.

## 2023-04-28 NOTE — Telephone Encounter (Signed)
Called and spoke to patient.  He is aware that bx will preformed by IR. IR department will contact him to schedule. Advised him to contact our office if IR has not contacted him by Friday. He voiced his understanding.  Nothing further needed.

## 2023-04-29 ENCOUNTER — Telehealth: Payer: Self-pay | Admitting: Cardiology

## 2023-04-29 NOTE — Telephone Encounter (Signed)
Patient has been scheduled for lung biopsy on 05/10/23 and need your approval to hold plavix for 5 days prior to procedure

## 2023-04-30 NOTE — Progress Notes (Signed)
Oley Balm, MD sent to [facility] PROCEDURE / BIOPSY REVIEW Date: 04/27/23  Requested Biopsy site: RML pleural based mass Reason for request: PET+ Imaging review: Best seen on PET Im 61 Se610   Decision: Approved Imaging modality to perform: CT Schedule with: Moderate Sedation Schedule for: Any VIR  Additional comments:   Please contact me with questions, concerns, or if issue pertaining to this request arise.  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

## 2023-04-30 NOTE — Telephone Encounter (Signed)
   Patient Name: Austin Patterson  DOB: 1956-04-12 MRN: [id number]  Primary Cardiologist: Debbe Odea, MD  Chart reviewed as part of pre-operative protocol coverage. Pre-op clearance already addressed by colleagues in earlier phone notes. To summarize recommendations:  -Okay to hold Plavix 5 days before procedure.  Restart after procedure when safe.  -Dr. Azucena Cecil  Will remove from pre-op pool. Please call with questions.  Sharlene Dory, PA-C 04/30/2023, 10:40 AM

## 2023-05-04 ENCOUNTER — Inpatient Hospital Stay: Payer: Medicare HMO | Admitting: Internal Medicine

## 2023-05-06 ENCOUNTER — Other Ambulatory Visit: Payer: Self-pay | Admitting: Radiology

## 2023-05-06 DIAGNOSIS — Z01812 Encounter for preprocedural laboratory examination: Secondary | ICD-10-CM

## 2023-05-07 NOTE — Progress Notes (Signed)
Patient for CT Lung Biopsy on Monday 05/10/2023, I called and spoke with the patient on the phone and gave pre-procedure instructions. Pt was made aware to be here at 9a, last dose of Plavix and ASA 81mg  was on Tues 05/04/2023 he stated, NPO after MN prior to procedure as well as driver post procedure/recovery/discharge. Pt stated understanding.  Called 05/07/2023

## 2023-05-09 ENCOUNTER — Other Ambulatory Visit: Payer: Self-pay | Admitting: Student

## 2023-05-10 ENCOUNTER — Emergency Department: Payer: Medicare HMO

## 2023-05-10 ENCOUNTER — Ambulatory Visit: Admission: RE | Admit: 2023-05-10 | Payer: Medicare HMO | Source: Ambulatory Visit

## 2023-05-10 ENCOUNTER — Other Ambulatory Visit: Payer: Self-pay

## 2023-05-10 ENCOUNTER — Ambulatory Visit
Admission: RE | Admit: 2023-05-10 | Discharge: 2023-05-10 | Disposition: A | Discharge status: Home / Self Care | Payer: Medicare HMO | Source: Ambulatory Visit | Attending: Pulmonary Disease | Admitting: Pulmonary Disease

## 2023-05-10 ENCOUNTER — Inpatient Hospital Stay
Admission: EM | Admit: 2023-05-10 | Discharge: 2023-05-14 | DRG: 190 | Disposition: A | Discharge status: Home Health | Payer: Medicare HMO | Attending: Internal Medicine | Admitting: Internal Medicine

## 2023-05-10 DIAGNOSIS — Z9889 Other specified postprocedural states: Secondary | ICD-10-CM

## 2023-05-10 DIAGNOSIS — I4510 Unspecified right bundle-branch block: Secondary | ICD-10-CM | POA: Diagnosis present

## 2023-05-10 DIAGNOSIS — I2510 Atherosclerotic heart disease of native coronary artery without angina pectoris: Secondary | ICD-10-CM | POA: Diagnosis present

## 2023-05-10 DIAGNOSIS — R079 Chest pain, unspecified: Secondary | ICD-10-CM | POA: Diagnosis present

## 2023-05-10 DIAGNOSIS — R918 Other nonspecific abnormal finding of lung field: Secondary | ICD-10-CM | POA: Insufficient documentation

## 2023-05-10 DIAGNOSIS — Z7951 Long term (current) use of inhaled steroids: Secondary | ICD-10-CM

## 2023-05-10 DIAGNOSIS — Z955 Presence of coronary angioplasty implant and graft: Secondary | ICD-10-CM

## 2023-05-10 DIAGNOSIS — Z01812 Encounter for preprocedural laboratory examination: Secondary | ICD-10-CM | POA: Insufficient documentation

## 2023-05-10 DIAGNOSIS — C342 Malignant neoplasm of middle lobe, bronchus or lung: Secondary | ICD-10-CM | POA: Diagnosis present

## 2023-05-10 DIAGNOSIS — E8801 Alpha-1-antitrypsin deficiency: Secondary | ICD-10-CM | POA: Diagnosis present

## 2023-05-10 DIAGNOSIS — E222 Syndrome of inappropriate secretion of antidiuretic hormone: Secondary | ICD-10-CM | POA: Diagnosis present

## 2023-05-10 DIAGNOSIS — R410 Disorientation, unspecified: Secondary | ICD-10-CM | POA: Diagnosis not present

## 2023-05-10 DIAGNOSIS — J4550 Severe persistent asthma, uncomplicated: Secondary | ICD-10-CM | POA: Diagnosis present

## 2023-05-10 DIAGNOSIS — J84112 Idiopathic pulmonary fibrosis: Secondary | ICD-10-CM | POA: Diagnosis present

## 2023-05-10 DIAGNOSIS — Z8639 Personal history of other endocrine, nutritional and metabolic disease: Secondary | ICD-10-CM

## 2023-05-10 DIAGNOSIS — Z87891 Personal history of nicotine dependence: Secondary | ICD-10-CM

## 2023-05-10 DIAGNOSIS — M7989 Other specified soft tissue disorders: Secondary | ICD-10-CM | POA: Diagnosis present

## 2023-05-10 DIAGNOSIS — G8918 Other acute postprocedural pain: Principal | ICD-10-CM

## 2023-05-10 DIAGNOSIS — Z6831 Body mass index (BMI) 31.0-31.9, adult: Secondary | ICD-10-CM

## 2023-05-10 DIAGNOSIS — I10 Essential (primary) hypertension: Secondary | ICD-10-CM | POA: Diagnosis present

## 2023-05-10 DIAGNOSIS — Z7982 Long term (current) use of aspirin: Secondary | ICD-10-CM

## 2023-05-10 DIAGNOSIS — N400 Enlarged prostate without lower urinary tract symptoms: Secondary | ICD-10-CM | POA: Diagnosis present

## 2023-05-10 DIAGNOSIS — J942 Hemothorax: Secondary | ICD-10-CM | POA: Diagnosis present

## 2023-05-10 DIAGNOSIS — D638 Anemia in other chronic diseases classified elsewhere: Secondary | ICD-10-CM | POA: Diagnosis present

## 2023-05-10 DIAGNOSIS — E871 Hypo-osmolality and hyponatremia: Secondary | ICD-10-CM | POA: Diagnosis present

## 2023-05-10 DIAGNOSIS — R0781 Pleurodynia: Secondary | ICD-10-CM | POA: Diagnosis present

## 2023-05-10 DIAGNOSIS — K219 Gastro-esophageal reflux disease without esophagitis: Secondary | ICD-10-CM | POA: Diagnosis present

## 2023-05-10 DIAGNOSIS — Z880 Allergy status to penicillin: Secondary | ICD-10-CM

## 2023-05-10 DIAGNOSIS — E785 Hyperlipidemia, unspecified: Secondary | ICD-10-CM | POA: Diagnosis present

## 2023-05-10 DIAGNOSIS — J90 Pleural effusion, not elsewhere classified: Secondary | ICD-10-CM | POA: Diagnosis present

## 2023-05-10 DIAGNOSIS — Z8616 Personal history of COVID-19: Secondary | ICD-10-CM

## 2023-05-10 DIAGNOSIS — R911 Solitary pulmonary nodule: Secondary | ICD-10-CM | POA: Diagnosis present

## 2023-05-10 DIAGNOSIS — Z7902 Long term (current) use of antithrombotics/antiplatelets: Secondary | ICD-10-CM

## 2023-05-10 DIAGNOSIS — Z79899 Other long term (current) drug therapy: Secondary | ICD-10-CM

## 2023-05-10 DIAGNOSIS — J4489 Other specified chronic obstructive pulmonary disease: Secondary | ICD-10-CM | POA: Diagnosis present

## 2023-05-10 DIAGNOSIS — C3490 Malignant neoplasm of unspecified part of unspecified bronchus or lung: Secondary | ICD-10-CM | POA: Insufficient documentation

## 2023-05-10 DIAGNOSIS — J9621 Acute and chronic respiratory failure with hypoxia: Secondary | ICD-10-CM | POA: Diagnosis present

## 2023-05-10 DIAGNOSIS — J439 Emphysema, unspecified: Secondary | ICD-10-CM | POA: Diagnosis present

## 2023-05-10 DIAGNOSIS — S9031XA Contusion of right foot, initial encounter: Secondary | ICD-10-CM | POA: Diagnosis present

## 2023-05-10 DIAGNOSIS — J441 Chronic obstructive pulmonary disease with (acute) exacerbation: Secondary | ICD-10-CM | POA: Diagnosis not present

## 2023-05-10 DIAGNOSIS — J9611 Chronic respiratory failure with hypoxia: Secondary | ICD-10-CM | POA: Diagnosis present

## 2023-05-10 DIAGNOSIS — Y848 Other medical procedures as the cause of abnormal reaction of the patient, or of later complication, without mention of misadventure at the time of the procedure: Secondary | ICD-10-CM | POA: Diagnosis present

## 2023-05-10 DIAGNOSIS — J95811 Postprocedural pneumothorax: Secondary | ICD-10-CM | POA: Diagnosis present

## 2023-05-10 DIAGNOSIS — E669 Obesity, unspecified: Secondary | ICD-10-CM | POA: Diagnosis present

## 2023-05-10 DIAGNOSIS — Z8249 Family history of ischemic heart disease and other diseases of the circulatory system: Secondary | ICD-10-CM

## 2023-05-10 DIAGNOSIS — Z7952 Long term (current) use of systemic steroids: Secondary | ICD-10-CM

## 2023-05-10 DIAGNOSIS — J939 Pneumothorax, unspecified: Secondary | ICD-10-CM | POA: Diagnosis present

## 2023-05-10 DIAGNOSIS — W208XXA Other cause of strike by thrown, projected or falling object, initial encounter: Secondary | ICD-10-CM | POA: Diagnosis present

## 2023-05-10 HISTORY — DX: Pleurodynia: R07.81

## 2023-05-10 LAB — CBC
HCT: 31.6 % — ABNORMAL LOW (ref 39.0–52.0)
Hemoglobin: 10.1 g/dL — ABNORMAL LOW (ref 13.0–17.0)
MCH: 27.4 pg (ref 26.0–34.0)
MCHC: 32 g/dL (ref 30.0–36.0)
MCV: 85.6 fL (ref 80.0–100.0)
Platelets: 300 10*3/uL (ref 150–400)
RBC: 3.69 MIL/uL — ABNORMAL LOW (ref 4.22–5.81)
RDW: 18.1 % — ABNORMAL HIGH (ref 11.5–15.5)
WBC: 8.7 10*3/uL (ref 4.0–10.5)
nRBC: 0 % (ref 0.0–0.2)

## 2023-05-10 LAB — CBC WITH DIFFERENTIAL/PLATELET
Abs Immature Granulocytes: 0.05 10*3/uL (ref 0.00–0.07)
Basophils Absolute: 0 10*3/uL (ref 0.0–0.1)
Basophils Relative: 0 %
Eosinophils Absolute: 0.1 10*3/uL (ref 0.0–0.5)
Eosinophils Relative: 1 %
HCT: 32.1 % — ABNORMAL LOW (ref 39.0–52.0)
Hemoglobin: 10.2 g/dL — ABNORMAL LOW (ref 13.0–17.0)
Immature Granulocytes: 1 %
Lymphocytes Relative: 11 %
Lymphs Abs: 1.1 10*3/uL (ref 0.7–4.0)
MCH: 27.3 pg (ref 26.0–34.0)
MCHC: 31.8 g/dL (ref 30.0–36.0)
MCV: 86.1 fL (ref 80.0–100.0)
Monocytes Absolute: 1.2 10*3/uL — ABNORMAL HIGH (ref 0.1–1.0)
Monocytes Relative: 12 %
Neutro Abs: 7.4 10*3/uL (ref 1.7–7.7)
Neutrophils Relative %: 75 %
Platelets: 313 10*3/uL (ref 150–400)
RBC: 3.73 MIL/uL — ABNORMAL LOW (ref 4.22–5.81)
RDW: 18.1 % — ABNORMAL HIGH (ref 11.5–15.5)
WBC: 9.8 10*3/uL (ref 4.0–10.5)
nRBC: 0 % (ref 0.0–0.2)

## 2023-05-10 LAB — COMPREHENSIVE METABOLIC PANEL
ALT: 19 U/L (ref 0–44)
AST: 33 U/L (ref 15–41)
Albumin: 4.4 g/dL (ref 3.5–5.0)
Alkaline Phosphatase: 64 U/L (ref 38–126)
Anion gap: 10 (ref 5–15)
BUN: 12 mg/dL (ref 8–23)
CO2: 27 mmol/L (ref 22–32)
Calcium: 9.2 mg/dL (ref 8.9–10.3)
Chloride: 92 mmol/L — ABNORMAL LOW (ref 98–111)
Creatinine, Ser: 0.7 mg/dL (ref 0.61–1.24)
GFR, Estimated: >60 mL/min (ref >60)
Glucose, Bld: 135 mg/dL — ABNORMAL HIGH (ref 70–99)
Potassium: 4.7 mmol/L (ref 3.5–5.1)
Sodium: 129 mmol/L — ABNORMAL LOW (ref 135–145)
Total Bilirubin: 1 mg/dL (ref 0.3–1.2)
Total Protein: 7.8 g/dL (ref 6.5–8.1)

## 2023-05-10 LAB — PROTIME-INR
INR: 1.1 (ref 0.8–1.2)
Prothrombin Time: 14.4 seconds (ref 11.4–15.2)

## 2023-05-10 MED ORDER — IOHEXOL 300 MG/ML  SOLN
100.0000 mL | Freq: Once | INTRAMUSCULAR | Status: AC | PRN
  Administered 2023-05-10: 100 mL via INTRAVENOUS

## 2023-05-10 MED ORDER — MORPHINE SULFATE (PF) 4 MG/ML IV SOLN: 4.0000 mg | INTRAVENOUS | Status: DC | PRN

## 2023-05-10 MED ORDER — ONDANSETRON HCL 4 MG/2ML IJ SOLN: 4.0000 mg | Freq: Four times a day (QID) | INTRAMUSCULAR | Status: DC | PRN

## 2023-05-10 MED ORDER — SODIUM CHLORIDE 0.9 % IV SOLN: INTRAVENOUS | Status: DC

## 2023-05-10 MED ORDER — PANTOPRAZOLE SODIUM 40 MG PO TBEC
40.0000 mg | DELAYED_RELEASE_TABLET | Freq: Two times a day (BID) | ORAL | Status: DC
  Administered 2023-05-10 – 2023-05-14 (×8): 40 mg via ORAL
  Filled 2023-05-10 (×8): qty 1

## 2023-05-10 MED ORDER — LOSARTAN POTASSIUM 50 MG PO TABS
100.0000 mg | ORAL_TABLET | Freq: Every day | ORAL | Status: DC
  Administered 2023-05-10 – 2023-05-14 (×5): 100 mg via ORAL
  Filled 2023-05-10 (×5): qty 2

## 2023-05-10 MED ORDER — METOPROLOL SUCCINATE ER 50 MG PO TB24
50.0000 mg | ORAL_TABLET | Freq: Every day | ORAL | Status: DC
  Administered 2023-05-10 – 2023-05-14 (×5): 50 mg via ORAL
  Filled 2023-05-10 (×5): qty 1

## 2023-05-10 MED ORDER — FENTANYL CITRATE (PF) 100 MCG/2ML IJ SOLN
INTRAMUSCULAR | Status: AC | PRN
  Administered 2023-05-10: 25 ug via INTRAVENOUS

## 2023-05-10 MED ORDER — MOMETASONE FURO-FORMOTEROL FUM 200-5 MCG/ACT IN AERO
2.0000 | INHALATION_SPRAY | Freq: Two times a day (BID) | RESPIRATORY_TRACT | Status: DC
  Administered 2023-05-11: 2 via RESPIRATORY_TRACT
  Filled 2023-05-10: qty 8.8

## 2023-05-10 MED ORDER — MORPHINE SULFATE (PF) 4 MG/ML IV SOLN
4.0000 mg | Freq: Once | INTRAVENOUS | Status: AC
  Administered 2023-05-10: 4 mg via INTRAVENOUS
  Filled 2023-05-10: qty 1

## 2023-05-10 MED ORDER — FENTANYL CITRATE (PF) 100 MCG/2ML IJ SOLN
INTRAMUSCULAR | Status: AC
  Administered 2023-05-10: 25 ug via INTRAVENOUS
  Filled 2023-05-10: qty 2

## 2023-05-10 MED ORDER — FENTANYL CITRATE (PF) 100 MCG/2ML IJ SOLN
INTRAMUSCULAR | Status: AC
  Filled 2023-05-10: qty 2

## 2023-05-10 MED ORDER — AMLODIPINE BESYLATE 10 MG PO TABS
10.0000 mg | ORAL_TABLET | Freq: Every day | ORAL | Status: DC
  Administered 2023-05-10 – 2023-05-14 (×5): 10 mg via ORAL
  Filled 2023-05-10 (×5): qty 1

## 2023-05-10 MED ORDER — LIDOCAINE 1 % OPTIME INJ - NO CHARGE
10.0000 mL | Freq: Once | INTRAMUSCULAR | Status: AC
  Administered 2023-05-10: 10 mL via INTRADERMAL
  Filled 2023-05-10: qty 10

## 2023-05-10 MED ORDER — MORPHINE SULFATE (PF) 4 MG/ML IV SOLN: 4.0000 mg | Freq: Once | INTRAVENOUS | Status: DC

## 2023-05-10 MED ORDER — HYDROMORPHONE HCL 1 MG/ML IJ SOLN
1.0000 mg | INTRAMUSCULAR | Status: DC | PRN
  Administered 2023-05-10 – 2023-05-11 (×6): 1 mg via INTRAVENOUS
  Filled 2023-05-10 (×6): qty 1

## 2023-05-10 MED ORDER — GABAPENTIN 300 MG PO CAPS
300.0000 mg | ORAL_CAPSULE | Freq: Three times a day (TID) | ORAL | Status: DC
  Administered 2023-05-10 – 2023-05-14 (×11): 300 mg via ORAL
  Filled 2023-05-10 (×11): qty 1

## 2023-05-10 MED ORDER — LOSARTAN POTASSIUM-HCTZ 100-25 MG PO TABS: 1.0000 | ORAL_TABLET | Freq: Every day | ORAL | Status: DC

## 2023-05-10 MED ORDER — ONDANSETRON HCL 4 MG PO TABS
4.0000 mg | ORAL_TABLET | Freq: Four times a day (QID) | ORAL | Status: DC | PRN
  Administered 2023-05-14: 4 mg via ORAL
  Filled 2023-05-10: qty 1

## 2023-05-10 MED ORDER — ATORVASTATIN CALCIUM 20 MG PO TABS
40.0000 mg | ORAL_TABLET | Freq: Every day | ORAL | Status: DC
  Administered 2023-05-10 – 2023-05-13 (×4): 40 mg via ORAL
  Filled 2023-05-10 (×4): qty 2

## 2023-05-10 MED ORDER — ENOXAPARIN SODIUM 40 MG/0.4ML IJ SOSY: 40.0000 mg | PREFILLED_SYRINGE | INTRAMUSCULAR | Status: DC

## 2023-05-10 MED ORDER — HYDROCHLOROTHIAZIDE 25 MG PO TABS
25.0000 mg | ORAL_TABLET | Freq: Every day | ORAL | Status: DC
  Administered 2023-05-10 – 2023-05-14 (×5): 25 mg via ORAL
  Filled 2023-05-10 (×5): qty 1

## 2023-05-10 MED ORDER — ENOXAPARIN SODIUM 60 MG/0.6ML IJ SOSY
0.5000 mg/kg | PREFILLED_SYRINGE | INTRAMUSCULAR | Status: DC
  Administered 2023-05-10 – 2023-05-13 (×4): 47.5 mg via SUBCUTANEOUS
  Filled 2023-05-10 (×4): qty 0.6

## 2023-05-10 MED ORDER — MIDAZOLAM HCL 2 MG/2ML IJ SOLN
INTRAMUSCULAR | Status: AC
  Filled 2023-05-10: qty 2

## 2023-05-10 MED ORDER — TAMSULOSIN HCL 0.4 MG PO CAPS
0.4000 mg | ORAL_CAPSULE | Freq: Every day | ORAL | Status: DC
  Administered 2023-05-10 – 2023-05-13 (×4): 0.4 mg via ORAL
  Filled 2023-05-10 (×4): qty 1

## 2023-05-10 NOTE — Assessment & Plan Note (Signed)
Followed by outpatient pulmonology

## 2023-05-10 NOTE — Assessment & Plan Note (Addendum)
Positive pleuritic chest pain status post lung biopsy today Postprocedure imaging with noted Trace right pneumothorax anteriorly adjacent to the large right lung mass, Small intermediate-high density right pleural effusion vs. Hemothorax  Patient noted to have been preliminary evaluated by Dr. Jayme Cloud in the ER with recommendation for observation Pain control Continue supplemental O2 in the setting of chronic respiratory failure on 2 L Formal pulmonary consult if any respiratory decline

## 2023-05-10 NOTE — Assessment & Plan Note (Addendum)
Chronic respiratory failure on 2 L in the setting of alpha-1 antitrypsin deficiency, COPD, noted multi lung mass on imaging Monitoring respiratory status in the setting of pleuritic chest pain with noted trace pneumothorax and hemothorax on imaging post lung biopsy  Continue supplemental oxygen as needed Formal pulmonology evaluation if patient declines from a respiratory standpoint Follow

## 2023-05-10 NOTE — Assessment & Plan Note (Signed)
Noted 7.7 x 6 cm right middle lobe mass, Continued enlargement of two right lower lobe nodules measuring 2.2 cm and 1.3 cm  Status post lung biopsy today

## 2023-05-10 NOTE — Assessment & Plan Note (Signed)
PPI ?

## 2023-05-10 NOTE — ED Provider Notes (Signed)
[facility] Provider Note    Event Date/Time   First MD Initiated Contact with Patient 05/10/23 1240     (approximate)   History   Shortness of Breath   HPI  Austin Patterson is a 67 y.o. male  who presents to the emergency department today because of concern for chest and upper abdominal pain after biopsy earlier today. The patient had the biopsy of a right lung mass done with IR, states even before they had finished the procedure he started having significant pain. Was given pain medication without significant relief in IR.        Physical Exam   Triage Vital Signs: ED Triage Vitals  Encounter Vitals Group     BP 05/10/23 1228 116/64     Systolic BP Percentile --      Diastolic BP Percentile --      Pulse Rate 05/10/23 1228 90     Resp 05/10/23 1228 (!) 24     Temp 05/10/23 1228 97.7 F (36.5 C)     Temp Source 05/10/23 1228 Oral     SpO2 05/10/23 1228 97 %     Weight --      Height --      Head Circumference --      Peak Flow --      Pain Score 05/10/23 1232 10     Pain Loc --      Pain Education --      Exclude from Growth Chart --     Most recent vital signs: Vitals:   05/10/23 1228  BP: 116/64  Pulse: 90  Resp: (!) 24  Temp: 97.7 F (36.5 C)  SpO2: 97%   General: Awake, alert, oriented. CV:  Good peripheral perfusion. Regular rate and rhythm. Resp:  Normal effort. Lungs clear. Abd:  No distention. Distended, tympanitic. Minimally tender to palpation in the upper abdomen.  ED Results / Procedures / Treatments   Labs (all labs ordered are listed, but only abnormal results are displayed) Labs Reviewed  CBC WITH DIFFERENTIAL/PLATELET - Abnormal; Notable for the following components:      Result Value   RBC 3.73 (*)    Hemoglobin 10.2 (*)    HCT 32.1 (*)    RDW 18.1 (*)    Monocytes Absolute 1.2 (*)    All other components within normal limits  COMPREHENSIVE METABOLIC PANEL     EKG  I, Phineas Semen,  attending physician, personally viewed and interpreted this EKG  EKG Time: 1242 Rate: 83 Rhythm: sinus rhythm Axis: normal Intervals: qtc 460 QRS: RBBB ST changes: no st elevation Impression: abnormal ekg  RADIOLOGY I independently interpreted and visualized the CT chest/abd/pel. My interpretation: No free air in abdomen Radiology interpretation:  IMPRESSION:  1. Trace right pneumothorax anteriorly adjacent to the large right  lung mass.  2. Small intermediate-high density right pleural effusion, new since  04/19/2023, which could be related to malignant effusion versus  small hemothorax in the setting of recent lung biopsy.  3. Right middle lobe lung mass has slightly increased in size since  04/19/2023, now measuring 7.7 x 6.0 cm transaxially (previously 7.1  x 5.5 cm).  4. Continued enlargement of two right lower lobe nodules measuring  2.2 cm and 1.3 cm (previously 1.8 cm and 1.0 cm). New 0.7 cm left  lower lobe nodule. Findings are compatible with progression of  metastatic disease.  5. Pathologically enlarged mediastinal, right hilar, and upper  abdominal lymph nodes,  also compatible with metastatic disease.    Aortic Atherosclerosis (ICD10-I70.0) and Emphysema (ICD10-J43.9).    I independently interpreted and visualized the CXR. My interpretation: No pneumothorax. Radiology interpretation:  IMPRESSION:  1. Stable chronic emphysema and right lung bullous disease.  2. Stable large right middle lobe mass  3. No complicating feature by plain radiography.     PROCEDURES:  Critical Care performed: No    MEDICATIONS ORDERED IN ED: Medications - No data to display   IMPRESSION / MDM / ASSESSMENT AND PLAN / ED COURSE  I reviewed the triage vital signs and the nursing notes.                              Differential diagnosis includes, but is not limited to, pneumothorax, post procedural pain, obstruction.  Patient's presentation is most consistent with acute  presentation with potential threat to life or bodily function.   The patient is on the cardiac monitor to evaluate for evidence of arrhythmia and/or significant heart rate changes.  Patient presented to the emergency department today because of concerns for pain after a lung biopsy performed earlier today.  Patient in no respiratory distress.  Patient did receive narcotics during the procedure and states they did not help his pain at all.  Imaging performed here shows expected postprocedure findings although no concerning the large pneumothorax or pneumoperitoneum.  Dr. Jayme Cloud with pulmonary did come to see the patient briefly and did think he would benefit from observation overnight for pain control.    FINAL CLINICAL IMPRESSION(S) / ED DIAGNOSES   Final diagnoses:  Post procedure discomfort     Note:  This document was prepared using Dragon voice recognition software and may include unintentional dictation errors.    Phineas Semen, MD 05/10/23 [phone]

## 2023-05-10 NOTE — Assessment & Plan Note (Signed)
Continue statin. 

## 2023-05-10 NOTE — Progress Notes (Signed)
Patient came back to procedure in a lot of pain, rolling around in bed, Paige RN procedure nurse gave of fentanyl before coming back and then this RN gave 25mg  of fentanyl in postop, both with no effect, O2 up to 6L [place], patient came in on 3L , patient working hard to breathe, dressing cdi to Right lower chest area, patient states this is where the pain is, chest xray was completed and per Dr Miles Costain states no changes noted, Dr Miles Costain at bedside and states patient needs to go to ED for further evaluation, report given to Annette Stable RN in ED, spoke to patients friend Hilda Lias who will bring patients phone, patient has all other belongings and home o2 tank with label on it.  Vidal Schwalbe RN

## 2023-05-10 NOTE — Progress Notes (Signed)
PHARMACIST - PHYSICIAN COMMUNICATION  CONCERNING:  Enoxaparin (Lovenox) for DVT Prophylaxis    RECOMMENDATION: Patient was prescribed enoxaprin 40mg  q24 hours for VTE prophylaxis.   There were no vitals filed for this visit.  There is no height or weight on file to calculate BMI.  Estimated Creatinine Clearance: 95.7 mL/min (by C-G formula based on SCr of 0.7 mg/dL).   Based on [facility] policy patient is candidate for enoxaparin 0.5mg /kg TBW SQ every 24 hours based on BMI being >30.   DESCRIPTION: Pharmacy has adjusted enoxaparin dose per [facility] policy.  Patient is now receiving enoxaparin 0.5 mg/kg every 24 hours    Lowella Bandy, PharmD Clinical Pharmacist  05/10/2023 5:27 PM

## 2023-05-10 NOTE — Assessment & Plan Note (Signed)
No active chest pain Holding antiplatelet regimen postprocedure for now

## 2023-05-10 NOTE — Sedation Documentation (Signed)
PT c/o worsening pain in R chest. MD aware. See [facility]

## 2023-05-10 NOTE — Procedures (Signed)
Interventional Radiology Procedure Note  Procedure: CT RML LARGE MASS PERIPHERAL CORE BX    Complications: None  Estimated Blood Loss:  0  Findings: 2 18 G CORES     M. Ruel Favors, MD

## 2023-05-10 NOTE — ED Triage Notes (Signed)
Pt states he was upstairs having biopsy on his lung after the procedure he became short of breath, Pt is normally on 2-3L [place] at baseline. Pt c/o pain at biopsy site.

## 2023-05-10 NOTE — Assessment & Plan Note (Signed)
Fairly stable from a respiratory standpoint at present Continue home inhalers Follow

## 2023-05-10 NOTE — H&P (Addendum)
History and Physical    Patient: Austin Patterson:[id number] DOB: 1956-03-08 DOA: 05/10/2023 DOS: the patient was seen and examined on 05/10/2023 PCP: Pcp, No  Patient coming from:  Interventional radiology  Chief Complaint:  Chief Complaint  Patient presents with   Shortness of Breath   HPI: Austin Patterson is a 67 y.o. 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  Patient noted to have been admitted June 15 through June 17 for pleuritic chest pain.  Had a noted 6.8 cm lung mass with planned CT lung biopsy.  However, biopsy was deferred.  Patient had lung biopsy today.  Patient reports significant pain status post biopsy.  Patient was referred to the ER for further evaluation.  CT imaging noted with right trace pneumothorax as well as trace hemothorax.  Per report, Dr. Jayme Cloud evaluated patient at the bedside recommended observation overnight.  Patient denies any nausea or vomiting.  Minimal shortness of breath.  Does have some chest pain with deep breathing and movement.  No abdominal pain or diarrhea.  No fevers or chills. Presented to the ER afebrile, hemodynamically stable, satting around 97% on 2 to 4 L nasal cannula.  White count 8.7, hemoglobin 10.1, platelets 300, creatinine 0.7, sodium 129. CT chest post lung biopsy today showing trace right pneumothorax anteriorly adjacent to the large right lung mass as well as small intermediate high density right pleural effusion versus small hemothorax, increased size of right middle lobe lung mass around 7.7 x 6 cm, continued enlargement of 2  right lobe nodules measuring 2.2 cm and 1.3 cm.  Concern for met metastatic disease. Review of Systems: As mentioned in the history of present illness. All other systems reviewed and are negative. Past Medical History:  Diagnosis Date   Abdominal pain 12/07/2022   Acute urinary retention 12/07/2022   COPD (chronic obstructive pulmonary disease) (HCC)    GERD (gastroesophageal reflux disease)    Hyperlipidemia    Hypertension    Pulmonary fibrosis (HCC) 11/2015   Past Surgical History:  Procedure Laterality Date   COLONOSCOPY     CORONARY STENT PLACEMENT     ESOPHAGOGASTRODUODENOSCOPY (EGD) WITH PROPOFOL N/A 09/23/2016   Procedure: ESOPHAGOGASTRODUODENOSCOPY (EGD) WITH PROPOFOL;  Surgeon: Wyline Mood, MD;  Location: [facility] END[facility];  Service: Endoscopy;  Laterality: N/A;   KYPHOPLASTY N/A 03/14/2020   Procedure: T7 & T11 KYPHOPLASTY;  Surgeon: Kennedy Bucker, MD;  Location: [facility] ORS;  Service: Orthopedics;  Laterality: N/A;   SHOULDER ACROMIOPLASTY     Social History:  reports that he quit smoking about 7 years ago. His smoking use included cigarettes. He started smoking about 57 years ago. He has a 100 pack-year smoking history. He has quit using smokeless tobacco. He reports current alcohol use of about 56.0 standard drinks of alcohol per week. He reports that he does not use drugs.  Allergies  Allergen Reactions   Amoxicillin Anaphylaxis   Tizanidine     Feet and ankle swell     Family History  Problem Relation Age of Onset   Heart disease Mother     Prior to Admission medications   Medication Sig Start Date End Date Taking? Authorizing Provider  acetaminophen (TYLENOL) 500 MG  tablet Take 1,000 mg by mouth every 6 (six) hours as needed for moderate pain or headache.    [provider]  albuterol (VENTOLIN HFA) 108 (90 Base) MCG/ACT inhaler Inhale 2 puffs into the lungs every 6 (six) hours as needed for wheezing or shortness of breath. 01/29/20   Danford, Earl Lites, MD   amLODipine (NORVASC) 10 MG tablet Take 1 tablet (10 mg total) by mouth daily. 08/19/16   Virl Axe, MD  aspirin EC 81 MG tablet Take 1 tablet (81 mg total) by mouth daily. 08/19/16   Virl Axe, MD  atorvastatin (LIPITOR) 40 MG tablet Take 1 tablet (40 mg total) by mouth at bedtime. 08/19/16   Virl Axe, MD  CALCIUM 600/VITAMIN D 600-10 MG-MCG TABS Take 1 tablet by mouth 2 (two) times daily. 11/20/21   [provider]  chlorpheniramine-HYDROcodone (TUSSIONEX) 10-8 MG/5ML Take 5 mLs by mouth at bedtime as needed for cough. 04/05/23   Sunnie Nielsen, DO  clopidogrel (PLAVIX) 75 MG tablet Take 1 tablet (75 mg total) by mouth daily. 08/19/16   Virl Axe, MD  Dupilumab (DUPIXENT) 300 MG/2ML SOPN Inject 300 mg into the skin every 14 (fourteen) days. 11/04/22   Salena Saner, MD  Ensure Max Protein (ENSURE MAX PROTEIN) LIQD Take 330 mLs (11 oz total) by mouth 2 (two) times daily. Patient not taking: Reported on 05/10/2023 12/09/22   Esaw Grandchild A, DO  EPINEPHrine 0.3 mg/0.3 mL IJ SOAJ injection Inject 0.3 mg into the muscle as needed for anaphylaxis.    [provider]  fluticasone-salmeterol (ADVAIR DISKUS) 250-50 MCG/ACT AEPB Inhale 1 puff into the lungs in the morning and at bedtime. 03/04/22   Salena Saner, MD  gabapentin (NEURONTIN) 300 MG capsule Take 1 capsule (300 mg total) by mouth 3 (three) times daily. 04/27/23   Salena Saner, MD  ipratropium-albuterol (DUONEB) 0.5-2.5 (3) MG/3ML SOLN INHALE 1 VIAL THROUGH NEBULIZER EVERY 6 HOURS 10/01/22   Salena Saner, MD  KLOR-CON M20 20 MEQ tablet Take 20 mEq by mouth daily. 11/20/21   [provider]  losartan-hydrochlorothiazide (HYZAAR) 100-25 MG tablet Take 1 tablet by mouth daily. 11/19/21   [provider]  magnesium oxide (MAG-OX) 400 (241.3 Mg) MG tablet Take 1 tablet (400 mg total) by mouth daily. 06/18/17   Milagros Loll, MD  metoprolol succinate (TOPROL-XL) 50 MG 24 hr tablet  Take 1 tablet (50 mg total) by mouth daily. 08/19/16   Virl Axe, MD  montelukast (SINGULAIR) 10 MG tablet Take 10 mg by mouth daily. 11/19/21   [provider]  Multiple Vitamin (MULTIVITAMIN WITH MINERALS) TABS tablet Take 1 tablet by mouth daily. 09/24/16   Enedina Finner, MD  pantoprazole (PROTONIX) 40 MG tablet Take 1 tablet (40 mg total) by mouth 2 (two) times daily. 09/23/16   Enedina Finner, MD  predniSONE (DELTASONE) 10 MG tablet Take 1 tablet (10 mg total) by mouth daily as needed. Take as directed. 04/27/23   Salena Saner, MD  senna-docusate (SENOKOT-S) 8.6-50 MG tablet Take 1 tablet by mouth 2 (two) times daily. 12/09/22   Pennie Banter, DO  Spacer/Aero-Holding Chambers (AEROCHAMBER MV) inhaler Use as instructed 10/05/19   Salena Saner, MD  tamsulosin (FLOMAX) 0.4 MG CAPS capsule Take 1 capsule (0.4 mg total) by mouth daily after supper. 12/09/22   Pennie Banter, DO    Physical Exam: Vitals:   05/10/23 1228 05/10/23 1330 05/10/23 1757  BP: 116/64 [name])  149/92   Pulse: 90 85   Resp: (!) 24 20   Temp: 97.7 F (36.5 C)    TempSrc: Oral    SpO2: 97% 97%   Weight:   88.5 kg   Physical Exam Constitutional:      Appearance: He is normal weight.  HENT:     Head: Normocephalic.     Nose: Nose normal.     Mouth/Throat:     Mouth: Mucous membranes are moist.  Eyes:     Pupils: Pupils are equal, round, and reactive to light.  Cardiovascular:     Rate and Rhythm: Normal rate and regular rhythm.  Pulmonary:     Effort: Pulmonary effort is normal.  Abdominal:     General: Bowel sounds are normal.  Musculoskeletal:        General: Normal range of motion.     Comments: + TTP over R anterior chest wall    Skin:    General: Skin is warm.  Neurological:     Mental Status: Mental status is at baseline.     Data Reviewed:  There are no new results to review at this time. CT CHEST ABDOMEN PELVIS W CONTRAST CLINICAL DATA:  Chest pain and abdominal pain and  shortness of breath following lung biopsy  EXAM: CT CHEST, ABDOMEN, AND PELVIS WITH CONTRAST  TECHNIQUE: Multidetector CT imaging of the chest, abdomen and pelvis was performed following the standard protocol during bolus administration of intravenous contrast.  RADIATION DOSE REDUCTION: This exam was performed according to the departmental dose-optimization program which includes automated exposure control, adjustment of the mA and/or kV according to patient size and/or use of iterative reconstruction technique.  CONTRAST:  OMNIPAQUE IOHEXOL 300 MG/ML  SOLN  COMPARISON:  PET-CT 04/19/2023, CT 04/03/2023  FINDINGS: CT CHEST FINDINGS  Cardiovascular: Heart size within normal limits. No pericardial effusion. Thoracic aorta is nonaneurysmal. Extensive atherosclerotic calcifications of the aorta and coronary arteries. Central pulmonary vasculature is within normal limits.  Mediastinum/Nodes: Pathologically enlarged mediastinal and right hilar lymph nodes including reference precarinal node measuring 1.8 cm short axis (previously 1.7 cm) and 1.5 cm right hilar node. No axillary or left hilar lymphadenopathy.  Lungs/Pleura: Small right pleural effusion with internal density of 40 HU, new since 04/19/2023, which could be related to malignant effusion versus small hemothorax in the setting of recent lung biopsy. Trace right pneumothorax anteriorly adjacent to the large right lung mass (series 4, image 89). Right middle lobe lung mass has slightly increased in size since 04/19/2023, now measuring 7.7 x 6.0 cm transaxially (previously 7.1 x 5.5 cm). Continued enlargement of 2 right lower lobe nodules measuring 2.2 cm and 1.3 cm (previously 1.8 cm and 1.0 cm). New 0.7 cm left lower lobe nodule (series 4, image 128). Background of advanced emphysema. Large bulla in the right middle lobe.  Musculoskeletal: Prior cement augmentation of the T6 and T10 vertebral bodies. Chronic  bilateral scapular fractures. No discrete lytic or sclerotic bone lesion. No chest wall hematoma.  CT ABDOMEN PELVIS FINDINGS  Hepatobiliary: Subcentimeter low-density lesion within the right hepatic lobe is too small to characterize (series 2, image 54). Liver appears otherwise unremarkable. Unremarkable gallbladder. No hyperdense gallstone. No biliary dilatation.  Pancreas: Unremarkable. No pancreatic ductal dilatation or surrounding inflammatory changes.  Spleen: Normal in size without focal abnormality.  Adrenals/Urinary Tract: Stable benign adrenal adenomas which do not require follow-up imaging. Areas of cortical scarring of both kidneys. No renal stone or hydronephrosis. Urinary bladder within normal limits.  Stomach/Bowel: Stomach is within normal limits. Scattered colonic diverticulosis. No evidence of bowel wall thickening, distention, or inflammatory changes.  Vascular/Lymphatic: Aortic atherosclerosis. Enlarged lymph node in the porta hepatis measuring 1.6 cm. No enlarged pelvic lymph nodes.  Reproductive: Prostate is unremarkable.  Other: No free fluid. No abdominopelvic fluid collection. No pneumoperitoneum.  Musculoskeletal: No acute or significant osseous findings.  IMPRESSION: 1. Trace right pneumothorax anteriorly adjacent to the large right lung mass. 2. Small intermediate-high density right pleural effusion, new since 04/19/2023, which could be related to malignant effusion versus small hemothorax in the setting of recent lung biopsy. 3. Right middle lobe lung mass has slightly increased in size since 04/19/2023, now measuring 7.7 x 6.0 cm transaxially (previously 7.1 x 5.5 cm). 4. Continued enlargement of two right lower lobe nodules measuring 2.2 cm and 1.3 cm (previously 1.8 cm and 1.0 cm). New 0.7 cm left lower lobe nodule. Findings are compatible with progression of metastatic disease. 5. Pathologically enlarged mediastinal, right hilar, and  upper abdominal lymph nodes, also compatible with metastatic disease.  Aortic Atherosclerosis (ICD10-I70.0) and Emphysema (ICD10-J43.9).  Electronically Signed   By: Duanne Guess D.O.   On: 05/10/2023 15:11 DG Chest 2 View CLINICAL DATA:  Shortness of breath following biopsy  EXAM: CHEST - 2 VIEW  COMPARISON:  05/10/2023  FINDINGS: Stable aeration and chronic emphysema change with right lung bullous disease and basilar scarring. Large anterior right middle lobe mass again noted, biopsied earlier today. No enlarging or significant pneumothorax by plain radiography. No developing pleural effusion. No new collapse or consolidation. Trachea midline. Normal heart size and vascularity. Chronic osseous changes and previous vertebral augmentations noted on the lateral view.  IMPRESSION: 1. Stable chronic emphysema and right lung bullous disease. 2. Stable large right middle lobe mass 3. No complicating feature by plain radiography.  Electronically Signed   By: Judie Petit.  Shick M.D.   On: 05/10/2023 13:29 DG Chest Port 1 View CLINICAL DATA:  Large right middle lobe mass status post biopsy  EXAM: PORTABLE CHEST 1 VIEW  COMPARISON:  05/10/2023, 04/03/2019  FINDINGS: Large right middle lobe mass again noted. No effusion or significant pneumothorax following biopsy by plain radiography. Background emphysema noted. Mid and lower lung parenchymal scarring evident. Stable heart size and vascularity. No edema. No new collapse or consolidation.  IMPRESSION: Large right middle lobe mass, recently biopsied. No complicating feature by plain radiography.  Electronically Signed   By: Judie Petit.  Shick M.D.   On: 05/10/2023 11:53 CT LUNG MASS BIOPSY INDICATION: Large anterior right middle lobe mass  EXAM: CT-GUIDED BIOPSY CORE BIOPSY LARGE RIGHT MIDDLE LOBE MASS  MEDICATIONS: 1% LIDOCAINE LOCAL  ANESTHESIA/SEDATION: 0 mg IV Versed; 25 mcg IV Fentanyl  Moderate Sedation Time:   NONE.  The patient was continuously monitored during the procedure by the interventional radiology nurse under my direct supervision.  PROCEDURE: The procedure, risks, benefits, and alternatives were explained to the patient. Questions regarding the procedure were encouraged and answered. The patient understands and consents to the procedure.  Previous imaging reviewed. Patient positioned supine. Noncontrast localization CT performed. The anterior large right middle lobe mass abutting the anterior chest wall was localized and marked for biopsy.  Under sterile conditions and local anesthesia, the 17 gauge core biopsy needle was advanced from an anterior intercostal approach to the lesion. Needle position confirmed with CT. 2 18 gauge core biopsies obtained. Samples were intact and non fragmented. These were placed in formalin. Needle tract occluded with the bio sentry device. Postprocedure  imaging demonstrates no complicating feature. Stable emphysema and adjacent bullous disease.  Patient tolerated the procedure well without complication. Vital sign monitoring by nursing staff during the procedure will continue as patient is in the special procedures unit for post procedure observation.  FINDINGS: The images document guide needle placement within the large anterior right middle lobe mass. Post biopsy images demonstrate no hemorrhage, hematoma, large effusion, or pneumothorax.  COMPLICATIONS: None immediate.  IMPRESSION: Successful CT-guided core biopsy of the large anterior right middle lobe mass  RADIATION DOSE REDUCTION: This exam was performed according to the departmental dose-optimization program which includes automated exposure control, adjustment of the mA and/or kV according to patient size and/or use of iterative reconstruction technique.  Electronically Signed   By: Judie Petit.  Shick M.D.   On: 05/10/2023 11:51  Lab Results  Component Value Date   WBC 9.8  05/10/2023   HGB 10.2 (L) 05/10/2023   HCT 32.1 (L) 05/10/2023   MCV 86.1 05/10/2023   PLT 313 05/10/2023   Last metabolic panel Lab Results  Component Value Date   GLUCOSE 135 (H) 05/10/2023   NA 129 (L) 05/10/2023   K 4.7 05/10/2023   CL 92 (L) 05/10/2023   CO2 27 05/10/2023   BUN 12 05/10/2023   CREATININE 0.70 05/10/2023   GFRNONAA >60 05/10/2023   CALCIUM 9.2 05/10/2023   PHOS 3.4 12/09/2022   PROT 7.8 05/10/2023   ALBUMIN 4.4 05/10/2023   BILITOT 1.0 05/10/2023   ALKPHOS 64 05/10/2023   AST 33 05/10/2023   ALT 19 05/10/2023   ANIONGAP 10 05/10/2023    Assessment and Plan: Chest pain Positive pleuritic chest pain status post lung biopsy today Postprocedure imaging with noted Trace right pneumothorax anteriorly adjacent to the large right lung mass, Small intermediate-high density right pleural effusion vs. Hemothorax  Patient noted to have been preliminary evaluated by Dr. Jayme Cloud in the ER with recommendation for observation Pain control Continue supplemental O2 in the setting of chronic respiratory failure on 2 L Formal pulmonary consult if any respiratory decline  CAD (coronary artery disease) No active chest pain Holding antiplatelet regimen postprocedure for now  Chronic respiratory failure with hypoxia (HCC) Chronic respiratory failure on 2 L in the setting of alpha-1 antitrypsin deficiency, COPD, noted multi lung mass on imaging Monitoring respiratory status in the setting of pleuritic chest pain with noted trace pneumothorax and hemothorax on imaging post lung biopsy  Continue supplemental oxygen as needed Formal pulmonology evaluation if patient declines from a respiratory standpoint Follow  Heterozygous alpha 1-antitrypsin deficiency (HCC) Followed by outpatient pulmonology  Lung nodule seen on imaging study Noted 7.7 x 6 cm right middle lobe mass, Continued enlargement of two right lower lobe nodules measuring 2.2 cm and 1.3 cm  Status post lung  biopsy today   GERD without esophagitis PPI  Asthma-COPD overlap syndrome Fairly stable from a respiratory standpoint at present Continue home inhalers Follow  Hyperlipidemia Continue statin  Hyponatremia Sodium 129 which appears to be 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 Observation status is judged to be reasonable and necessary in order to provide the required intensity of service to ensure the patient's safety. The patient's presenting symptoms, physical exam findings, and initial  radiographic and laboratory data in the context of their medical condition is felt to place them at decreased risk for further clinical deterioration. Furthermore, it is anticipated that the patient will be medically stable for discharge from the hospital within 2 midnights of admission.   Author: Floydene Flock, MD 05/10/2023 6:00 PM  For on call review [email or web address].

## 2023-05-10 NOTE — Assessment & Plan Note (Signed)
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

## 2023-05-11 ENCOUNTER — Observation Stay: Payer: Medicare HMO

## 2023-05-11 ENCOUNTER — Inpatient Hospital Stay: Payer: Medicare HMO

## 2023-05-11 DIAGNOSIS — K219 Gastro-esophageal reflux disease without esophagitis: Secondary | ICD-10-CM | POA: Diagnosis present

## 2023-05-11 DIAGNOSIS — I4510 Unspecified right bundle-branch block: Secondary | ICD-10-CM | POA: Diagnosis present

## 2023-05-11 DIAGNOSIS — Z880 Allergy status to penicillin: Secondary | ICD-10-CM | POA: Diagnosis not present

## 2023-05-11 DIAGNOSIS — Y848 Other medical procedures as the cause of abnormal reaction of the patient, or of later complication, without mention of misadventure at the time of the procedure: Secondary | ICD-10-CM | POA: Diagnosis present

## 2023-05-11 DIAGNOSIS — E785 Hyperlipidemia, unspecified: Secondary | ICD-10-CM | POA: Diagnosis present

## 2023-05-11 DIAGNOSIS — Z8249 Family history of ischemic heart disease and other diseases of the circulatory system: Secondary | ICD-10-CM | POA: Diagnosis not present

## 2023-05-11 DIAGNOSIS — I2510 Atherosclerotic heart disease of native coronary artery without angina pectoris: Secondary | ICD-10-CM | POA: Diagnosis present

## 2023-05-11 DIAGNOSIS — E8801 Alpha-1-antitrypsin deficiency: Secondary | ICD-10-CM | POA: Diagnosis present

## 2023-05-11 DIAGNOSIS — J9621 Acute and chronic respiratory failure with hypoxia: Secondary | ICD-10-CM | POA: Diagnosis present

## 2023-05-11 DIAGNOSIS — J939 Pneumothorax, unspecified: Secondary | ICD-10-CM | POA: Diagnosis present

## 2023-05-11 DIAGNOSIS — J84112 Idiopathic pulmonary fibrosis: Secondary | ICD-10-CM | POA: Diagnosis present

## 2023-05-11 DIAGNOSIS — G8918 Other acute postprocedural pain: Secondary | ICD-10-CM | POA: Diagnosis not present

## 2023-05-11 DIAGNOSIS — J90 Pleural effusion, not elsewhere classified: Secondary | ICD-10-CM | POA: Diagnosis present

## 2023-05-11 DIAGNOSIS — N400 Enlarged prostate without lower urinary tract symptoms: Secondary | ICD-10-CM | POA: Diagnosis present

## 2023-05-11 DIAGNOSIS — J95811 Postprocedural pneumothorax: Secondary | ICD-10-CM | POA: Diagnosis present

## 2023-05-11 DIAGNOSIS — I10 Essential (primary) hypertension: Secondary | ICD-10-CM | POA: Diagnosis present

## 2023-05-11 DIAGNOSIS — D638 Anemia in other chronic diseases classified elsewhere: Secondary | ICD-10-CM | POA: Diagnosis present

## 2023-05-11 DIAGNOSIS — E222 Syndrome of inappropriate secretion of antidiuretic hormone: Secondary | ICD-10-CM | POA: Diagnosis present

## 2023-05-11 DIAGNOSIS — Z955 Presence of coronary angioplasty implant and graft: Secondary | ICD-10-CM | POA: Diagnosis not present

## 2023-05-11 DIAGNOSIS — Z87891 Personal history of nicotine dependence: Secondary | ICD-10-CM | POA: Diagnosis not present

## 2023-05-11 DIAGNOSIS — J439 Emphysema, unspecified: Secondary | ICD-10-CM | POA: Diagnosis present

## 2023-05-11 DIAGNOSIS — Z8616 Personal history of COVID-19: Secondary | ICD-10-CM | POA: Diagnosis not present

## 2023-05-11 DIAGNOSIS — C342 Malignant neoplasm of middle lobe, bronchus or lung: Secondary | ICD-10-CM | POA: Diagnosis present

## 2023-05-11 DIAGNOSIS — M7989 Other specified soft tissue disorders: Secondary | ICD-10-CM | POA: Diagnosis present

## 2023-05-11 DIAGNOSIS — J942 Hemothorax: Secondary | ICD-10-CM

## 2023-05-11 DIAGNOSIS — W208XXA Other cause of strike by thrown, projected or falling object, initial encounter: Secondary | ICD-10-CM | POA: Diagnosis present

## 2023-05-11 DIAGNOSIS — E669 Obesity, unspecified: Secondary | ICD-10-CM | POA: Diagnosis present

## 2023-05-11 DIAGNOSIS — J441 Chronic obstructive pulmonary disease with (acute) exacerbation: Secondary | ICD-10-CM | POA: Diagnosis present

## 2023-05-11 DIAGNOSIS — R0781 Pleurodynia: Secondary | ICD-10-CM | POA: Diagnosis present

## 2023-05-11 DIAGNOSIS — J4550 Severe persistent asthma, uncomplicated: Secondary | ICD-10-CM | POA: Diagnosis present

## 2023-05-11 HISTORY — DX: Hemothorax: J94.2

## 2023-05-11 HISTORY — DX: Postprocedural pneumothorax: J95.811

## 2023-05-11 LAB — COMPREHENSIVE METABOLIC PANEL
ALT: 18 U/L (ref 0–44)
AST: 20 U/L (ref 15–41)
Albumin: 4.1 g/dL (ref 3.5–5.0)
Alkaline Phosphatase: 63 U/L (ref 38–126)
Anion gap: 9 (ref 5–15)
BUN: 18 mg/dL (ref 8–23)
CO2: 29 mmol/L (ref 22–32)
Calcium: 8.8 mg/dL — ABNORMAL LOW (ref 8.9–10.3)
Chloride: 92 mmol/L — ABNORMAL LOW (ref 98–111)
Creatinine, Ser: 0.92 mg/dL (ref 0.61–1.24)
GFR, Estimated: >60 mL/min (ref >60)
Glucose, Bld: 115 mg/dL — ABNORMAL HIGH (ref 70–99)
Potassium: 4.1 mmol/L (ref 3.5–5.1)
Sodium: 130 mmol/L — ABNORMAL LOW (ref 135–145)
Total Bilirubin: 0.6 mg/dL (ref 0.3–1.2)
Total Protein: 6.9 g/dL (ref 6.5–8.1)

## 2023-05-11 LAB — CBC
HCT: 28 % — ABNORMAL LOW (ref 39.0–52.0)
Hemoglobin: 9.1 g/dL — ABNORMAL LOW (ref 13.0–17.0)
MCH: 28 pg (ref 26.0–34.0)
MCHC: 32.5 g/dL (ref 30.0–36.0)
MCV: 86.2 fL (ref 80.0–100.0)
Platelets: 297 10*3/uL (ref 150–400)
RBC: 3.25 MIL/uL — ABNORMAL LOW (ref 4.22–5.81)
RDW: 18.1 % — ABNORMAL HIGH (ref 11.5–15.5)
WBC: 9.6 10*3/uL (ref 4.0–10.5)
nRBC: 0 % (ref 0.0–0.2)

## 2023-05-11 LAB — HIV ANTIBODY (ROUTINE TESTING W REFLEX): HIV Screen 4th Generation wRfx: NONREACTIVE

## 2023-05-11 MED ORDER — ALBUTEROL SULFATE (2.5 MG/3ML) 0.083% IN NEBU: 2.5000 mg | INHALATION_SOLUTION | RESPIRATORY_TRACT | Status: DC | PRN

## 2023-05-11 MED ORDER — ARFORMOTEROL TARTRATE 15 MCG/2ML IN NEBU
15.0000 ug | INHALATION_SOLUTION | Freq: Two times a day (BID) | RESPIRATORY_TRACT | Status: DC
  Administered 2023-05-11 – 2023-05-14 (×7): 15 ug via RESPIRATORY_TRACT
  Filled 2023-05-11 (×8): qty 2

## 2023-05-11 MED ORDER — PREDNISONE 20 MG PO TABS
20.0000 mg | ORAL_TABLET | Freq: Every day | ORAL | Status: AC
  Administered 2023-05-11 – 2023-05-13 (×3): 20 mg via ORAL
  Filled 2023-05-11 (×3): qty 1

## 2023-05-11 MED ORDER — BUDESONIDE 0.25 MG/2ML IN SUSP
0.2500 mg | Freq: Two times a day (BID) | RESPIRATORY_TRACT | Status: DC
  Administered 2023-05-11 – 2023-05-14 (×7): 0.25 mg via RESPIRATORY_TRACT
  Filled 2023-05-11 (×7): qty 2

## 2023-05-11 MED ORDER — REVEFENACIN 175 MCG/3ML IN SOLN
175.0000 ug | Freq: Every day | RESPIRATORY_TRACT | Status: DC
  Administered 2023-05-11 – 2023-05-14 (×4): 175 ug via RESPIRATORY_TRACT
  Filled 2023-05-11 (×4): qty 3

## 2023-05-11 NOTE — Consult Note (Signed)
NAME:  Austin Patterson, MRN:  [id number], DOB:  [birth year], LOS: 0 ADMISSION DATE:  05/10/2023, CONSULTATION DATE: 11 May 2023 REFERRING MD: Lurene Shadow, MD, CHIEF COMPLAINT: Wheezing and shortness of breath.  History of Present Illness:  Austin Patterson is a 67 year old former smoker (100 TY) well-known to [facility], with stage IV very severe COPD, severe persistent asthma and chronic respiratory failure with hypoxia who was admitted yesterday after CT-guided lung mass biopsy.  Patient was noted to have a large lung mass and mediastinal adenopathy after an admission to [facility] between June 15 through June 17.  At that time he was also diagnosed with COVID-19.  He was evaluated on follow-up outpatient visit on 9 July to determine what would be the best method for diagnosis of this mass.  The patient is not a candidate for general anesthesia which would have been required for bronchoscopy, he was then set up for transthoracic core biopsy by IR.  The dominant mass on the right was amenable to such approach.  Patient had the procedure yesterday,he developed significant pleuritic pain after the procedure.  By plain films no overt pneumothorax or effusion could be seen however on subsequent CT chest there was a small right pneumothorax and small right pleural effusion which could be related to malignant effusion versus small hemothorax after the procedure.  The patient was admitted overnight for evaluation.  Today he developed some issues with increasing shortness of breath and wheezing.  He has not been on his regular pulmonary regimen.  He has not had any fevers, chills or sweats.  No cough.  His baseline liter flow of oxygen is 2 L/min, he is up to 4 L/min being at 88%.  Normally he saturates around 91%.  He has not had any nausea or vomiting.  No abdominal pain.  His only other complaint is of pain in the dorsum of his right foot where he had an oxygen tank strike him approximately a week ago.  This area is  ecchymotic and is fluctuant.  No other complaints voiced.  Pertinent  Medical History  Stage IV very severe COPD by GOLD classification Severe persistent asthma on Dupixent Chronic respiratory failure with hypoxia Heterozygous alpha 1 antitrypsin deficiency Chronic alcohol use Postinflammatory pulmonary fibrosis Chronic hyponatremia Coronary artery disease  Significant Hospital Events: Including procedures, antibiotic start and stop dates in addition to other pertinent events   05/10/2023 admitted after transthoracic needle biopsy of right lung mass, small iatrogenic pneumothorax, small hemothorax 05/11/2023 PCCM consulted for COPD exacerbation  Interim History / Subjective:  Increased shortness of breath from baseline, wheezing, no chest pain today.  Objective   Blood pressure 113/63, pulse 86, temperature 98 F (36.7 C), resp. rate 16, weight 88.5 kg, SpO2 (!) 88%.  Intake/Output Summary (Last 24 hours) at 05/11/2023 1259 Last data filed at 05/11/2023 0435 Gross per 24 hour  Intake --  Output 400 ml  Net -400 ml   Filed Weights   05/10/23 1757  Weight: 88.5 kg   SpO2: (!) 88 % O2 Flow Rate (L/min): 4 L/min  Examination: GENERAL: Chronically ill appearing gentleman, chronic use of accessories, presents in transport chair.  Wearing oxygen via nasal cannula.  No acute respiratory distress.  No plethora.  HEAD: Normocephalic, atraumatic. EYES: Pupils equal, round, reactive to light.  No scleral icterus. MOUTH: Teeth in poor repair, oral mucosa moist, no thrush. NECK: Supple. No thyromegaly. Trachea midline. No JVD.  No adenopathy. PULMONARY: Increased AP diameter, significant kyphosis. Distant breath sounds.  Scattered end expiratory wheezes throughout.  No rhonchi.  Positive Hoover's sign. CARDIOVASCULAR: S1 and S2. Regular rate and rhythm.  Distant heart tones no murmur appreciated. GASTROINTESTINAL: Protuberant abdomen otherwise benign. MUSCULOSKELETAL: No clubbing, no  edema.  There is ecchymosis of the dorsum of the right foot with an area of fluctuance, and some increased warmth and associated redness.  Tender to touch. NEUROLOGIC: No overt focal deficit.  Speech is fluent.  Awake, alert. SKIN: Intact,warm,dry. PSYCH: Anxious mood.  Imaging Reviewed  Representative image of lung windows from CT performed 10 May 2023 showing tiny anterior pneumothorax and small posterior pleural effusion:    Representative image of      Chest x-ray PA and lateral performed today, query air-fluid level and blunting of the costophrenic angle, recommend CT for better characterization:    Assessment & Plan:  Acute on chronic respiratory failure due to COPD exacerbation post procedure Underlying very severe stage IV COPD Continue oxygen to maintain O2 sats between 88 to 92%, titrate as tolerated Nebulizer treatments with Brovana, Pulmicort and Yupelri As needed albuterol nebulizer for breakthrough symptoms Short course prednisone 20 mg daily x 5 days  Iatrogenic pneumothorax Iatrogenic hemothorax Small pneumothorax will not need chest tube Modest drop in H&H likely due to hemothorax Hemothorax should resolve on own, small Recheck CT chest to ensure stability of above findings  Lung mass, right lower lobe Status postbiopsy Await pathology report Concern re: small cell  Query cellulitis/abscess right foot Imaging pending May need antibiotics/debridement  Hyponatremia, chronic Chronic alcohol use (beer) Monitor Maintain adequate volume status  Acute on chronic anemia Anemia of chronic disease + acute blood loss anemia Anemia of chronic disease Hemothorax post procedure Monitor No indication for transfusion  Labs   CBC: Recent Labs  Lab 05/10/23 0922 05/10/23 1244 05/11/23 0257  WBC 8.7 9.8 9.6  NEUTROABS  --  7.4  --   HGB 10.1* 10.2* 9.1*  HCT 31.6* 32.1* 28.0*  MCV 85.6 86.1 86.2  PLT 300 313 297    Basic Metabolic Panel: Recent  Labs  Lab 05/10/23 1244 05/11/23 0257  NA 129* 130*  K 4.7 4.1  CL 92* 92*  CO2 27 29  GLUCOSE 135* 115*  BUN 12 18  CREATININE 0.70 0.92  CALCIUM 9.2 8.8*   GFR: Estimated Creatinine Clearance: 81.2 mL/min (by C-G formula based on SCr of 0.92 mg/dL). Recent Labs  Lab 05/10/23 0922 05/10/23 1244 05/11/23 0257  WBC 8.7 9.8 9.6    Liver Function Tests: Recent Labs  Lab 05/10/23 1244 05/11/23 0257  AST 33 20  ALT 19 18  ALKPHOS 64 63  BILITOT 1.0 0.6  PROT 7.8 6.9  ALBUMIN 4.4 4.1   No results for input(s): "LIPASE", "AMYLASE" in the last 168 hours. No results for input(s): "AMMONIA" in the last 168 hours.  ABG    Component Value Date/Time   HCO3 31.1 (H) 01/24/2020 0906   TCO2 26 03/14/2020 1027   O2SAT 63.1 01/24/2020 0906     Coagulation Profile: Recent Labs  Lab 05/10/23 0922  INR 1.1    Cardiac Enzymes: No results for input(s): "CKTOTAL", "CKMB", "CKMBINDEX", "TROPONINI" in the last 168 hours.  HbA1C: Hgb A1c MFr Bld  Date/Time Value Ref Range Status  01/24/2022 07:23 PM 5.7 (H) 4.8 - 5.6 % Final    Comment:    (NOTE) Pre diabetes:          5.7%-6.4%  Diabetes:              >  6.4%  Glycemic control for   <7.0% adults with diabetes   12/09/2015 01:16 PM 4.9 4.0 - 6.0 % Final    CBG: No results for input(s): "GLUCAP" in the last 168 hours.  Review of Systems:   A 10 point review of systems was performed and it is as noted above otherwise negative.  Past Medical History:  He,  has a past medical history of Abdominal pain (12/07/2022), Acute urinary retention (12/07/2022), COPD (chronic obstructive pulmonary disease) (HCC), GERD (gastroesophageal reflux disease), Hyperlipidemia, Hypertension, and Pulmonary fibrosis (HCC) (11/2015).   Surgical History:   Past Surgical History:  Procedure Laterality Date   COLONOSCOPY     CORONARY STENT PLACEMENT     ESOPHAGOGASTRODUODENOSCOPY (EGD) WITH PROPOFOL N/A 09/23/2016   Procedure:  ESOPHAGOGASTRODUODENOSCOPY (EGD) WITH PROPOFOL;  Surgeon: Wyline Mood, MD;  Location: [facility] END[facility]COPY;  Service: Endoscopy;  Laterality: N/A;   KYPHOPLASTY N/A 03/14/2020   Procedure: T7 & T11 KYPHOPLASTY;  Surgeon: Kennedy Bucker, MD;  Location: [facility] ORS;  Service: Orthopedics;  Laterality: N/A;   SHOULDER ACROMIOPLASTY       Social History:   reports that he quit smoking about 7 years ago. His smoking use included cigarettes. He started smoking about 57 years ago. He has a 100 pack-year smoking history. He has quit using smokeless tobacco. He reports current alcohol use of about 56.0 standard drinks of alcohol per week. He reports that he does not use drugs.   Family History:  His family history includes Heart disease in his mother.   Allergies Allergies  Allergen Reactions   Amoxicillin Anaphylaxis   Tizanidine     Feet and ankle swell      Home Medications  Prior to Admission medications   Medication Sig Start Date End Date Taking? Authorizing Provider  acetaminophen (TYLENOL) 500 MG tablet Take 1,000 mg by mouth every 6 (six) hours as needed for moderate pain or headache.    [provider]  albuterol (VENTOLIN HFA) 108 (90 Base) MCG/ACT inhaler Inhale 2 puffs into the lungs every 6 (six) hours as needed for wheezing or shortness of breath. 01/29/20   Danford, Earl Lites, MD  amLODipine (NORVASC) 10 MG tablet Take 1 tablet (10 mg total) by mouth daily. 08/19/16   Virl Axe, MD  aspirin EC 81 MG tablet Take 1 tablet (81 mg total) by mouth daily. 08/19/16   Virl Axe, MD  atorvastatin (LIPITOR) 40 MG tablet Take 1 tablet (40 mg total) by mouth at bedtime. 08/19/16   Virl Axe, MD  CALCIUM 600/VITAMIN D 600-10 MG-MCG TABS Take 1 tablet by mouth 2 (two) times daily. 11/20/21   [provider]  chlorpheniramine-HYDROcodone (TUSSIONEX) 10-8 MG/5ML Take 5 mLs by mouth at bedtime as needed for cough. 04/05/23   Sunnie Nielsen, DO  clopidogrel (PLAVIX) 75 MG  tablet Take 1 tablet (75 mg total) by mouth daily. 08/19/16   Virl Axe, MD  Dupilumab (DUPIXENT) 300 MG/2ML SOPN Inject 300 mg into the skin every 14 (fourteen) days. 11/04/22   Salena Saner, MD  Ensure Max Protein (ENSURE MAX PROTEIN) LIQD Take 330 mLs (11 oz total) by mouth 2 (two) times daily. Patient not taking: Reported on 05/10/2023 12/09/22   Esaw Grandchild A, DO  EPINEPHrine 0.3 mg/0.3 mL IJ SOAJ injection Inject 0.3 mg into the muscle as needed for anaphylaxis.    [provider]  fluticasone-salmeterol (ADVAIR DISKUS) 250-50 MCG/ACT AEPB Inhale 1 puff into the lungs in the morning and  at bedtime. 03/04/22   Salena Saner, MD  gabapentin (NEURONTIN) 300 MG capsule Take 1 capsule (300 mg total) by mouth 3 (three) times daily. 04/27/23   Salena Saner, MD  ipratropium-albuterol (DUONEB) 0.5-2.5 (3) MG/3ML SOLN INHALE 1 VIAL THROUGH NEBULIZER EVERY 6 HOURS 10/01/22   Salena Saner, MD  KLOR-CON M20 20 MEQ tablet Take 20 mEq by mouth daily. 11/20/21   [provider]  losartan-hydrochlorothiazide (HYZAAR) 100-25 MG tablet Take 1 tablet by mouth daily. 11/19/21   [provider]  magnesium oxide (MAG-OX) 400 (241.3 Mg) MG tablet Take 1 tablet (400 mg total) by mouth daily. 06/18/17   Milagros Loll, MD  metoprolol succinate (TOPROL-XL) 50 MG 24 hr tablet Take 1 tablet (50 mg total) by mouth daily. 08/19/16   Virl Axe, MD  montelukast (SINGULAIR) 10 MG tablet Take 10 mg by mouth daily. 11/19/21   [provider]  Multiple Vitamin (MULTIVITAMIN WITH MINERALS) TABS tablet Take 1 tablet by mouth daily. 09/24/16   Enedina Finner, MD  pantoprazole (PROTONIX) 40 MG tablet Take 1 tablet (40 mg total) by mouth 2 (two) times daily. 09/23/16   Enedina Finner, MD  predniSONE (DELTASONE) 10 MG tablet Take 1 tablet (10 mg total) by mouth daily as needed. Take as directed. 04/27/23   Salena Saner, MD  senna-docusate (SENOKOT-S) 8.6-50 MG tablet Take 1 tablet by  mouth 2 (two) times daily. 12/09/22   Pennie Banter, DO  Spacer/Aero-Holding Chambers (AEROCHAMBER MV) inhaler Use as instructed 10/05/19   Salena Saner, MD  tamsulosin (FLOMAX) 0.4 MG CAPS capsule Take 1 capsule (0.4 mg total) by mouth daily after supper. 12/09/22   Pennie Banter, DO    Level 4 consult    I discussed all of the above with Dr. Myriam Forehand.  Will continue to follow-up along with you while patient in house.  Gailen Shelter, MD Advanced Bronchoscopy PCCM [facility]    *This note was dictated using voice recognition software/Dragon.  Despite best efforts to proofread, errors can occur which can change the meaning. Any transcriptional errors that result from this process are unintentional and may not be fully corrected at the time of dictation.

## 2023-05-11 NOTE — Progress Notes (Signed)
       CROSS COVER NOTE  NAME: Austin Patterson MRN: [id number] DOB : June 03, 1956    Concern as stated by nurse / staff   Pt admitted with c/o of SOB hx of COPD today he developed increased shortness of breath and wheezing; 02 increased from 2 to 4 liters to sustain 02 sats >88%.   S/P transthoracic core biopsy on 7/22; developed pleuritic pain after procedure; he has a small (R) pneumothorax and small right pleural effusion.  Currently pt is lethargic and confused but was alert and lucid at the beginning of shift.  Pt has been taking IV dilaudid since yesterday evening but has not had any since 1851.  Vital signs 97.5, 78, 20, 112/53 and 91% on 4L.  Please advise.   4 mins TC We did do a neuro assessment and everything else is baseline as far as speech, swallowing, PERRLA, and mobility.     Pertinent findings on chart review: Pulmonology note from earlier as well as hospitalist progress note in the day reviewed CT chest with contrast done earlier showed the following: IMPRESSION: 1. Very tiny right anterior pneumothorax, not significantly increased in size. 2. Small volume right pleural effusion slightly increased in size, but now measuring closer to water density. Short-term stability of right middle lobe large lung mass measuring up to 7.7 cm. 3. Right lower lobe suspected metastatic pulmonary nodules are more obscured by pleural fluid and atelectasis on this exam. Stable 7 mm medial left lung base pulmonary nodule. 4. Enlarged mediastinal and right hilar nodes as seen on prior contrast enhanced exam, concerning for metastatic disease. 5. Bilateral adrenal nodules consistent with benign adenomas.  Assessment and  Interventions   Assessment:  Acute confusion, possibly related to narcotics Postprocedural pneumothorax, with increasing O2 requirement Worsening of acute on chronic respiratory failure, possibly related to narcotics but possibly to worsening  pneumothorax  Plan: Stat chest x-ray to evaluate for expansion of pneumothorax Continue supplemental oxygen Head CT---->non acute Neurologic checks X X   CXR --->No definite pneumothorax identified. 2. Right middle lobe masslike opacities unchanged.  .Patient has remained stable for the rest of the night

## 2023-05-11 NOTE — Plan of Care (Signed)
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
## 2023-05-12 ENCOUNTER — Inpatient Hospital Stay: Payer: Medicare HMO

## 2023-05-12 DIAGNOSIS — G8918 Other acute postprocedural pain: Secondary | ICD-10-CM

## 2023-05-12 DIAGNOSIS — J9621 Acute and chronic respiratory failure with hypoxia: Secondary | ICD-10-CM

## 2023-05-12 DIAGNOSIS — J95811 Postprocedural pneumothorax: Secondary | ICD-10-CM

## 2023-05-12 DIAGNOSIS — J441 Chronic obstructive pulmonary disease with (acute) exacerbation: Secondary | ICD-10-CM

## 2023-05-12 DIAGNOSIS — R0781 Pleurodynia: Secondary | ICD-10-CM | POA: Diagnosis not present

## 2023-05-12 MED ORDER — OXYCODONE HCL 5 MG PO TABS
5.0000 mg | ORAL_TABLET | ORAL | Status: DC | PRN
  Administered 2023-05-12 – 2023-05-14 (×4): 5 mg via ORAL
  Filled 2023-05-12 (×4): qty 1

## 2023-05-12 NOTE — Progress Notes (Signed)
Mobility Specialist - Progress Note  05/12/23 1347  Mobility  Activity Dangled on edge of bed;Turned to right side;Turned to left side;Turned to back - supine  Level of Assistance Standby assist, set-up cues, supervision of patient - no hands on  Assistive Device None  Activity Response Tolerated well  $Mobility charge 1 Mobility  Mobility Specialist Start Time (ACUTE ONLY) 1335  Mobility Specialist Stop Time (ACUTE ONLY) 1346  Mobility Specialist Time Calculation (min) (ACUTE ONLY) 11 min   MS responding to call bell. Pt supine with BLE over the EOB upon entry, pt request to reposition in bed. Pt expresses lower back pain and foot pain-- RN made aware. Pt transferred fully to the EOB with supervision while MS removed objects from the bed. Pt returned supine, left with alarm set and needs within reach. RN notified.  Zetta Bills Mobility Specialist 05/12/23 1:52 PM

## 2023-05-12 NOTE — Progress Notes (Signed)
NAME:  Austin Patterson, MRN:  [id number], DOB:  May 07, 1956, LOS: 1 ADMISSION DATE:  05/10/2023, CONSULTATION DATE: 11 May 2023 REFERRING MD: Lurene Shadow, MD, CHIEF COMPLAINT: Wheezing and shortness of breath.  History of Present Illness:  Zebulin is a 67 year old former smoker (100 TY) well-known to [facility], with stage IV very severe COPD, severe persistent asthma and chronic respiratory failure with hypoxia who was admitted yesterday after CT-guided lung mass biopsy.  Patient was noted to have a large lung mass and mediastinal adenopathy after an admission to [facility] between June 15 through June 17.  At that time he was also diagnosed with COVID-19.  He was evaluated on follow-up outpatient visit on 9 July to determine what would be the best method for diagnosis of this mass.  The patient is not a candidate for general anesthesia which would have been required for bronchoscopy, he was then set up for transthoracic core biopsy by IR.  The dominant mass on the right was amenable to such approach.  Patient had the procedure yesterday,he developed significant pleuritic pain after the procedure.  By plain films no overt pneumothorax or effusion could be seen however on subsequent CT chest there was a small right pneumothorax and small right pleural effusion which could be related to malignant effusion versus small hemothorax after the procedure.  The patient was admitted overnight for evaluation.  Today he developed some issues with increasing shortness of breath and wheezing.  He has not been on his regular pulmonary regimen.  He has not had any fevers, chills or sweats.  No cough.  His baseline liter flow of oxygen is 2 L/min, he is up to 4 L/min being at 88%.  Normally he saturates around 91%.  He has not had any nausea or vomiting.  No abdominal pain.  His only other complaint is of pain in the dorsum of his right foot where he had an oxygen tank strike him approximately a week ago.  This area is  ecchymotic and is fluctuant.  No other complaints voiced.  Pertinent  Medical History  Stage IV very severe COPD by GOLD classification Severe persistent asthma on Dupixent Chronic respiratory failure with hypoxia Heterozygous alpha 1 antitrypsin deficiency Chronic alcohol use Postinflammatory pulmonary fibrosis Chronic hyponatremia Coronary artery disease  Significant Hospital Events: Including procedures, antibiotic start and stop dates in addition to other pertinent events   05/10/2023 admitted after transthoracic needle biopsy of right lung mass, small iatrogenic pneumothorax, small hemothorax 05/11/2023 PCCM consulted for COPD exacerbation, oxygen requirements up to 4 L/min, maximized bronchodilator therapy 05/12/2023 oxygen requirements down to 2 L/min  Interim History / Subjective:  Episode of confusion and lethargy last night, likely related to pain medicines.  Very alert today.  Shortness of breath markedly improved, no wheezing, no chest pain today.  Objective   Blood pressure 130/60, pulse 80, temperature 97.7 F (36.5 C), resp. rate 18, weight 88.5 kg, SpO2 99%.  Intake/Output Summary (Last 24 hours) at 05/12/2023 1443 Last data filed at 05/12/2023 1422 Gross per 24 hour  Intake 720 ml  Output 1250 ml  Net -530 ml   Filed Weights   05/10/23 1757  Weight: 88.5 kg   SpO2: 99 % O2 Flow Rate (L/min): 2 L/min  Examination: GENERAL: Chronically ill appearing gentleman, chronic use of accessories, in bed.  Wearing oxygen via nasal cannula.  No acute respiratory distress.  No plethora.  HEAD: Normocephalic, atraumatic. EYES: Pupils equal, round, reactive to light.  No scleral icterus.  MOUTH: Teeth in poor repair, oral mucosa moist, no thrush. NECK: Supple. No thyromegaly. Trachea midline. No JVD.  No adenopathy. PULMONARY: Increased AP diameter, significant kyphosis. Distant breath sounds.  Coarse, otherwise no adventitious sounds.  Positive Hoover's  sign. CARDIOVASCULAR: S1 and S2. Regular rate and rhythm.  Distant heart tones no murmur appreciated. GASTROINTESTINAL: Protuberant abdomen otherwise benign. MUSCULOSKELETAL: No clubbing, no edema.  There is ecchymosis of the dorsum of the right foot with an area of fluctuance, and some increased warmth and associated redness.  Tender to touch. NEUROLOGIC: No overt focal deficit.  Speech is fluent.  Awake, alert. SKIN: Intact,warm,dry. PSYCH: Anxious mood.  Imaging Reviewed  Representative image of lung windows from CT performed 11 May 2023 showing almost complete resolution of tiny anterior pneumothorax and decrease in small posterior pleural effusion:    Representative image of CT performed 23 July showing no significant increase in small posterior pleural effusion on right:   Assessment & Plan:  Acute on chronic respiratory failure due to COPD exacerbation post procedure Underlying very severe stage IV COPD Continue oxygen to maintain O2 sats between 88 to 92%, titrate as tolerated Back to baseline liter flow of 2 L/min. Continue nebulizer treatments with Brovana, Pulmicort and Yupelri while in-house May resume outpatient medications upon discharge As needed albuterol nebulizer for breakthrough symptoms Complete Short course prednisone 20 mg daily x 5 days (Day 4/5)  Iatrogenic pneumothorax Iatrogenic hemothorax Small pneumothorax will not need chest tube, resolving Modest drop in H&H likely due to small hemothorax Hemothorax should resolve on own, small, not worsening by CT  Lung mass, right lower lobe Status postbiopsy Pathology should be out today Concern re: small cell  Query cellulitis/abscess right foot Likely hematoma per ultrasound Podiatry evaluated patient  Hyponatremia, chronic Chronic alcohol use (beer) Monitor Maintain adequate volume status  Acute on chronic anemia Anemia of chronic disease + acute blood loss anemia Anemia of chronic disease Small  hemothorax post procedure Monitor Has not had indication for transfusion  Labs   CBC: Recent Labs  Lab 05/10/23 0922 05/10/23 1244 05/11/23 0257  WBC 8.7 9.8 9.6  NEUTROABS  --  7.4  --   HGB 10.1* 10.2* 9.1*  HCT 31.6* 32.1* 28.0*  MCV 85.6 86.1 86.2  PLT 300 313 297    Basic Metabolic Panel: Recent Labs  Lab 05/10/23 1244 05/11/23 0257  NA 129* 130*  K 4.7 4.1  CL 92* 92*  CO2 27 29  GLUCOSE 135* 115*  BUN 12 18  CREATININE 0.70 0.92  CALCIUM 9.2 8.8*   GFR: Estimated Creatinine Clearance: 81.2 mL/min (by C-G formula based on SCr of 0.92 mg/dL). Recent Labs  Lab 05/10/23 0922 05/10/23 1244 05/11/23 0257  WBC 8.7 9.8 9.6    Liver Function Tests: Recent Labs  Lab 05/10/23 1244 05/11/23 0257  AST 33 20  ALT 19 18  ALKPHOS 64 63  BILITOT 1.0 0.6  PROT 7.8 6.9  ALBUMIN 4.4 4.1   No results for input(s): "LIPASE", "AMYLASE" in the last 168 hours. No results for input(s): "AMMONIA" in the last 168 hours.  ABG    Component Value Date/Time   HCO3 31.1 (H) 01/24/2020 0906   TCO2 26 03/14/2020 1027   O2SAT 63.1 01/24/2020 0906     Coagulation Profile: Recent Labs  Lab 05/10/23 0922  INR 1.1    Cardiac Enzymes: No results for input(s): "CKTOTAL", "CKMB", "CKMBINDEX", "TROPONINI" in the last 168 hours.  HbA1C: Hgb A1c MFr Bld  Date/Time Value  Ref Range Status  01/24/2022 07:23 PM 5.7 (H) 4.8 - 5.6 % Final    Comment:    (NOTE) Pre diabetes:          5.7%-6.4%  Diabetes:              >6.4%  Glycemic control for   <7.0% adults with diabetes   12/09/2015 01:16 PM 4.9 4.0 - 6.0 % Final    CBG: No results for input(s): "GLUCAP" in the last 168 hours.  Review of Systems:   A 10 point review of systems was performed and it is as noted above otherwise negative.  Past Medical History:  He,  has a past medical history of Abdominal pain (12/07/2022), Acute urinary retention (12/07/2022), COPD (chronic obstructive pulmonary disease) (HCC),  GERD (gastroesophageal reflux disease), Hyperlipidemia, Hypertension, and Pulmonary fibrosis (HCC) (11/2015).   Surgical History:   Past Surgical History:  Procedure Laterality Date   COLONOSCOPY     CORONARY STENT PLACEMENT     ESOPHAGOGASTRODUODENOSCOPY (EGD) WITH PROPOFOL N/A 09/23/2016   Procedure: ESOPHAGOGASTRODUODENOSCOPY (EGD) WITH PROPOFOL;  Surgeon: Wyline Mood, MD;  Location: [facility] ENDOSCOPY;  Service: Endoscopy;  Laterality: N/A;   KYPHOPLASTY N/A 03/14/2020   Procedure: T7 & T11 KYPHOPLASTY;  Surgeon: Kennedy Bucker, MD;  Location: [facility] ORS;  Service: Orthopedics;  Laterality: N/A;   SHOULDER ACROMIOPLASTY       Social History:   reports that he quit smoking about 7 years ago. His smoking use included cigarettes. He started smoking about 57 years ago. He has a 100 pack-year smoking history. He has quit using smokeless tobacco. He reports current alcohol use of about 56.0 standard drinks of alcohol per week. He reports that he does not use drugs.   Family History:  His family history includes Heart disease in his mother.   Allergies Allergies  Allergen Reactions   Amoxicillin Anaphylaxis   Tizanidine     Feet and ankle swell      Home Medications  Prior to Admission medications   Medication Sig Start Date End Date Taking? Authorizing Provider  acetaminophen (TYLENOL) 500 MG tablet Take 1,000 mg by mouth every 6 (six) hours as needed for moderate pain or headache.    [provider]  albuterol (VENTOLIN HFA) 108 (90 Base) MCG/ACT inhaler Inhale 2 puffs into the lungs every 6 (six) hours as needed for wheezing or shortness of breath. 01/29/20   Danford, Earl Lites, MD  amLODipine (NORVASC) 10 MG tablet Take 1 tablet (10 mg total) by mouth daily. 08/19/16   Virl Axe, MD  aspirin EC 81 MG tablet Take 1 tablet (81 mg total) by mouth daily. 08/19/16   Virl Axe, MD  atorvastatin (LIPITOR) 40 MG tablet Take 1 tablet (40 mg total) by mouth at bedtime. 08/19/16    Virl Axe, MD  CALCIUM 600/VITAMIN D 600-10 MG-MCG TABS Take 1 tablet by mouth 2 (two) times daily. 11/20/21   [provider]  chlorpheniramine-HYDROcodone (TUSSIONEX) 10-8 MG/5ML Take 5 mLs by mouth at bedtime as needed for cough. 04/05/23   Sunnie Nielsen, DO  clopidogrel (PLAVIX) 75 MG tablet Take 1 tablet (75 mg total) by mouth daily. 08/19/16   Virl Axe, MD  Dupilumab (DUPIXENT) 300 MG/2ML SOPN Inject 300 mg into the skin every 14 (fourteen) days. 11/04/22   Salena Saner, MD  Ensure Max Protein (ENSURE MAX PROTEIN) LIQD Take 330 mLs (11 oz total) by mouth 2 (two) times daily. Patient not taking: Reported on  05/10/2023 12/09/22   Esaw Grandchild A, DO  EPINEPHrine 0.3 mg/0.3 mL IJ SOAJ injection Inject 0.3 mg into the muscle as needed for anaphylaxis.    [provider]  fluticasone-salmeterol (ADVAIR DISKUS) 250-50 MCG/ACT AEPB Inhale 1 puff into the lungs in the morning and at bedtime. 03/04/22   Salena Saner, MD  gabapentin (NEURONTIN) 300 MG capsule Take 1 capsule (300 mg total) by mouth 3 (three) times daily. 04/27/23   Salena Saner, MD  ipratropium-albuterol (DUONEB) 0.5-2.5 (3) MG/3ML SOLN INHALE 1 VIAL THROUGH NEBULIZER EVERY 6 HOURS 10/01/22   Salena Saner, MD  KLOR-CON M20 20 MEQ tablet Take 20 mEq by mouth daily. 11/20/21   [provider]  losartan-hydrochlorothiazide (HYZAAR) 100-25 MG tablet Take 1 tablet by mouth daily. 11/19/21   [provider]  magnesium oxide (MAG-OX) 400 (241.3 Mg) MG tablet Take 1 tablet (400 mg total) by mouth daily. 06/18/17   Milagros Loll, MD  metoprolol succinate (TOPROL-XL) 50 MG 24 hr tablet Take 1 tablet (50 mg total) by mouth daily. 08/19/16   Virl Axe, MD  montelukast (SINGULAIR) 10 MG tablet Take 10 mg by mouth daily. 11/19/21   [provider]  Multiple Vitamin (MULTIVITAMIN WITH MINERALS) TABS tablet Take 1 tablet by mouth daily. 09/24/16   Enedina Finner, MD  pantoprazole  (PROTONIX) 40 MG tablet Take 1 tablet (40 mg total) by mouth 2 (two) times daily. 09/23/16   Enedina Finner, MD  predniSONE (DELTASONE) 10 MG tablet Take 1 tablet (10 mg total) by mouth daily as needed. Take as directed. 04/27/23   Salena Saner, MD  senna-docusate (SENOKOT-S) 8.6-50 MG tablet Take 1 tablet by mouth 2 (two) times daily. 12/09/22   Pennie Banter, DO  Spacer/Aero-Holding Chambers (AEROCHAMBER MV) inhaler Use as instructed 10/05/19   Salena Saner, MD  tamsulosin (FLOMAX) 0.4 MG CAPS capsule Take 1 capsule (0.4 mg total) by mouth daily after supper. 12/09/22   Pennie Banter, DO    Scheduled Meds:  amLODipine  10 mg Oral Daily   arformoterol  15 mcg Nebulization BID   atorvastatin  40 mg Oral QHS   budesonide (PULMICORT) nebulizer solution  0.25 mg Nebulization BID   enoxaparin (LOVENOX) injection  0.5 mg/kg Subcutaneous Q24H   gabapentin  300 mg Oral TID   hydrochlorothiazide  25 mg Oral Daily   losartan  100 mg Oral Daily   metoprolol succinate  50 mg Oral Daily   pantoprazole  40 mg Oral BID   predniSONE  20 mg Oral Q breakfast   revefenacin  175 mcg Nebulization Daily   tamsulosin  0.4 mg Oral QPC supper   Continuous Infusions: PRN Meds:.albuterol, ondansetron **OR** ondansetron (ZOFRAN) IV, oxyCODONE   Level 2 visit   I discussed all of the above with Dr. Denton Lank.  Will continue to follow-up along with you while patient in house.  Gailen Shelter, MD Advanced Bronchoscopy PCCM [facility]    *This note was dictated using voice recognition software/Dragon.  Despite best efforts to proofread, errors can occur which can change the meaning. Any transcriptional errors that result from this process are unintentional and may not be fully corrected at the time of dictation.

## 2023-05-12 NOTE — Plan of Care (Signed)

## 2023-05-12 NOTE — TOC Progression Note (Addendum)
Transition of Care [facility]) - Progression Note    Patient Details  Name: Austin Patterson MRN: [id number] Date of Birth: 1956/08/04  [facility] of [facility]) CM/SW Contact  Marlowe Sax, RN Phone Number: 05/12/2023, 8:53 AM  Clinical Narrative:     TOC to continue to follow the patient and assist with DC planning and needs Continues to be on oxygen Has been open previously this year with [facility] for home health Debbe Odea, MD Is the patient's cardiologist Admitted from: home alone PCP: Christean Grief   Current home health/prior home health/DME: O2 concentrator and portable and RW, nebulizer  Expected Discharge Plan: Home/Self Care Barriers to Discharge: Continued Medical Work up  Expected Discharge Plan and Services   Discharge Planning Services: CM Consult   Living arrangements for the past 2 months: Single Family Home                                       Social Determinants of Health (SDOH) Interventions SDOH Screenings   Food Insecurity: No Food Insecurity (04/03/2023)  Housing: Low Risk  (04/03/2023)  Transportation Needs: No Transportation Needs (04/03/2023)  Utilities: Not At Risk (04/03/2023)  Tobacco Use: Medium Risk (05/10/2023)    Readmission Risk Interventions     No data to display

## 2023-05-12 NOTE — Evaluation (Signed)
Physical Therapy Evaluation Patient Details Name: Austin Patterson MRN: [id number] DOB: [birth year] Today's Date: 05/12/2023  History of Present Illness  Austin Patterson is a 67yoM who comes [facility] 7/22 after post procedure pain. Pt undergoing scheduled lung biopsy with IR this date. Pt being admitted for pain control s/p Iatrogenic pneumothorax ("tiny"), no plans for chest tube at time of evaluation. 2L  Clinical Impression  Pt reports pain improving in general, but feeling fatigued which is made worse by additional of analgesia. Pt now reporting significant pain in low back, a chronic issue in acute exacerbation, and also remarks on pain in Rt foot sustained after dropping an O2 cylinder on foot PTA. Pt reports managing mobility needs at home in his current state would be quite difficult, but historically, has been able to manage in his small living space and use of home health services. Encouraged pt to work on AMB distance next day to progress confidence in DC disposition.         Assistance Recommended at Discharge Set up Supervision/Assistance  If plan is discharge home, recommend the following:  Can travel by private vehicle  A little help with walking and/or transfers;Direct supervision/assist for financial management;Assist for transportation        Equipment Recommendations None recommended by PT  Recommendations for Other Services       Functional Status Assessment Patient has had a recent decline in their functional status and demonstrates the ability to make significant improvements in function in a reasonable and predictable amount of time.     Precautions / Restrictions Precautions Precautions: Fall Restrictions Weight Bearing Restrictions: No      Mobility  Bed Mobility Overal bed mobility: Needs Assistance Bed Mobility: Supine to Sit, Sit to Supine     Supine to sit: Supervision Sit to supine: Supervision   General bed mobility comments: HOB elevated     Transfers Overall transfer level: Needs assistance Equipment used: Rolling walker (2 wheels) Transfers: Sit to/from Stand Sit to Stand: Min guard           General transfer comment: weaker than baseline, needs RW at present    Ambulation/Gait Ambulation/Gait assistance: Min guard, Supervision Gait Distance (Feet): 30 Feet Assistive device: Rolling walker (2 wheels) Gait Pattern/deviations: Step-to pattern       General Gait Details: bedside to BR toilet and back to bedside; actually has done this a few time today, improving but acutely weak; no SOB  Stairs            Wheelchair Mobility     Tilt Bed    Modified Rankin (Stroke Patients Only)       Balance                                             Pertinent Vitals/Pain Pain Assessment Pain Assessment: Faces Faces Pain Scale: Hurts little more Pain Location: acute on chronic low back pain strated today, other acute pain feeling better Pain Intervention(s): Limited activity within patient's tolerance, Monitored during session, Premedicated before session    Home Living Family/patient expects to be discharged to:: Private residence Living Arrangements: Alone Available Help at Discharge: Available PRN/intermittently (Landlord Marie) Type of Home: House Home Access: Ramped entrance       Home Layout: Laundry or work area in basement Home Equipment: Gilmer Mor - single Librarian, academic (2 wheels);Rollator (4 wheels);Shower seat  Prior Function Prior Level of Function : Independent/Modified Independent;Driving             Mobility Comments: indep without AD with constant 02 2L- reports no falls ADLs Comments: Ind with self care and has someone come once every 1-2wks to assist with laundry and cleaning of home     Hand Dominance        Extremity/Trunk Assessment                Communication      Cognition Arousal/Alertness: Awake/alert Behavior During Therapy:  WFL for tasks assessed/performed Overall Cognitive Status: Within Functional Limits for tasks assessed                                          General Comments      Exercises     Assessment/Plan    PT Assessment Patient needs continued PT services  PT Problem List Decreased strength;Decreased activity tolerance;Decreased balance;Decreased mobility       PT Treatment Interventions DME instruction;Neuromuscular re-education;Gait training;Stair training;Functional mobility training;Therapeutic activities;Therapeutic exercise;Balance training    PT Goals (Current goals can be found in the Care Plan section)  Acute Rehab PT Goals Patient Stated Goal: regain strength, work with [facility] services at DC to continue rehab process PT Goal Formulation: With patient Time For Goal Achievement: 05/26/23 Potential to Achieve Goals: Good    Frequency Min 1X/week     Co-evaluation               AM-PAC PT "6 Clicks" Mobility  Outcome Measure Help needed turning from your back to your side while in a flat bed without using bedrails?: A Little Help needed moving from lying on your back to sitting on the side of a flat bed without using bedrails?: A Little Help needed moving to and from a bed to a chair (including a wheelchair)?: A Little Help needed standing up from a chair using your arms (e.g., wheelchair or bedside chair)?: A Little Help needed to walk in hospital room?: A Little Help needed climbing 3-5 steps with a railing? : A Lot 6 Click Score: 17    End of Session Equipment Utilized During Treatment: Oxygen Activity Tolerance: Patient limited by fatigue;Patient limited by lethargy (pain meds making tiredness worse) Patient left: in bed;with call bell/phone within reach   PT Visit Diagnosis: Difficulty in walking, not elsewhere classified (R26.2);Other abnormalities of gait and mobility (R26.89)    Time: 1610-9604 PT Time Calculation (min) (ACUTE ONLY): 12  min   Charges:   PT Evaluation $PT Eval Moderate Complexity: 1 Mod   PT General Charges $$ ACUTE PT VISIT: 1 Visit        4:06 PM, 05/12/23 Rosamaria Lints, PT, DPT Physical Therapist - [facility]  [phone] (ASCOM)    Chandra Feger C 05/12/2023, 4:04 PM

## 2023-05-12 NOTE — Progress Notes (Addendum)
Progress Note    Austin Patterson  ZOX:[id number] DOB: 22-Nov-1955  DOA: 05/10/2023 PCP: Pcp, No      Brief Narrative:    Medical records reviewed and are as summarized below:  Austin Patterson is a 67 y.o. 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  He presented to the hospital with right-sided pleuritic chest pain, shortness of breath after he had a lung biopsy by IR on 05/10/2023.  Lung biopsy was done on a recently diagnosed right middle lobe lung mass.   He was found to have trace right pneumothorax and right pleural effusion concerning for small hemothorax  Assessment/Plan:   Principal Problem:   Pleuritic pain Active Problems:   CAD (coronary artery disease)   Chest pain   Chronic respiratory failure with hypoxia (HCC)   COPD exacerbation (HCC)   Hyponatremia   Hyperlipidemia   Asthma-COPD overlap syndrome   GERD without esophagitis   Lung nodule seen on imaging study   Heterozygous alpha 1-antitrypsin deficiency (HCC)   Pneumothorax on right   Hemothorax on right   Swelling of right foot    Trace right pneumothorax, small right hemothorax: Iatrogenic s/p lung biopsy. --Pulmonology consulted, Dr. Jayme Cloud - appreciate recommendations 7/23 - CT chest without contrast showed very tiny right anterior PTX, not significantly increased in size.  Small volume right pleural effusion (closer to water density), stable 7.7 cm RML mass.  RLL nodules suspicious for mets, enlarged mediastinal and right hilar nodes also suspicious for metastatic disease. --No need for chest tube --Continue to monitor closely   Right middle lobe lung mass: Biopsy  was done on 05/10/23 with IR. --Follow-up pending pathology report   --Hx of chronic hyponatremia, concern for small cell   Acute on Chronic respiratory failure with hypoxia - baseline on 2 L/min O2, required up to 4 L/min. Due to COPD exacerbation and pneumothorax 7/23 - on baseline oxygen this AM --Titrate O2 for sats 88-92% --Pulmonology following --On neb treatments: Brovana, Pulmicort, Yupelri --PRN albuterol nebs --Prednisone 20 mg PO daily x 5 days  COPD exacerbation: Treat with bronchodilators and steroids.  Use only 20 mg of prednisone.  Patient is concerned about higher doses of prednisone because he thinks that caused him to have back problems.   Right foot hematoma: right foot swelling and tenderness.   Pt reported on oxygen tank fell on his right foot about 1 and half weeks prior to admission.   X-ray and ultrasound obtained - results consistent with likely hematoma. X-ray showed prominent dorsal soft tissue swelling, possible remote fracture of the fifth proximal phalanx. Ultrasound showed 4.2 cm lobular hypoechoic area along the dorsal aspect of right foot with differential diagnosis including hematoma. --Podiatry consulted, Dr. Clayburn Pert - follow up recommendations   Other comorbidities include:  CAD GERD Chronic hyponatremia - Na stable 129-130. History of asthma COPD overlap syndrome Chronic anemia  Obesity  - Body mass index is 31.49 kg/m. Complicates overall care and prognosis.  Recommend lifestyle modifications including physical activity and diet for weight loss and overall long-term health.    Consultants: Pulmonologist  Procedures: None   Family Communication/Anticipated D/C date and plan/Code Status   DVT prophylaxis: SCDs Start: 05/10/23 1709     Code  Status: Full Code  Family Communication: None Disposition Plan: Plan to discharge home in 1 to 2 days   Status is: Inpatient Remains inpatient appropriate because: COPD exacerbation warrants  further clinical improvement before d/c.  Expect potential d/c in 24-48 hours.    Subjective:   Pt reports right foot achy pain this AM at rest in bed, gets worse with weight-bearing.  Says podiatrist came in earlier.  Breathing is about same today, maybe little better.  Right chest pain worse with inspiration but overall this is improving.     Objective:    Vitals:   05/11/23 1614 05/11/23 2038 05/11/23 2240 05/12/23 0754  BP: (!) 95/54  (!) 112/53 130/60  Pulse: 83  78 80  Resp: 17  20 18   Temp: 98.3 F (36.8 C)  (!) 97.5 F (36.4 C) 97.7 F (36.5 C)  TempSrc:   Oral   SpO2: 91% 91% 91% 99%  Weight:       No data found.   Intake/Output Summary (Last 24 hours) at 05/12/2023 1311 Last data filed at 05/12/2023 1232 Gross per 24 hour  Intake 240 ml  Output 1250 ml  Net -1010 ml   Filed Weights   05/10/23 1757  Weight: 88.5 kg    Exam:  General exam: awake, alert, no acute distress HEENT: atraumatic, clear conjunctiva, anicteric sclera, moist mucus membranes, hearing grossly normal  Respiratory system: CTAB with poor aeration and expiratory wheezes, no rhonchi, normal respiratory effort at rest, on 2 L/min  O2 Cardiovascular system: normal S1/S2, RRR, no pedal edema.   Gastrointestinal system: soft, NT, ND, no HSM felt, +bowel sounds. Central nervous system: A&O x 3. no gross focal neurologic deficits, normal speech Extremities: focal area of swelling and tenderness on dorsal aspect right foot (hematoma), no edema, normal tone Skin: dry, intact, normal temperature Psychiatry: normal mood, congruent affect, judgement and insight appear normal        Data Reviewed:   I have personally reviewed following labs and imaging studies:  Labs: Labs show the following:   Basic Metabolic Panel: Recent Labs  Lab 05/10/23 1244 05/11/23 0257  NA 129* 130*  K 4.7 4.1  CL 92* 92*  CO2 27 29  GLUCOSE 135* 115*  BUN 12 18  CREATININE 0.70 0.92  CALCIUM 9.2 8.8*    GFR Estimated Creatinine Clearance: 81.2 mL/min (by C-G formula based on SCr of 0.92 mg/dL). Liver Function Tests: Recent Labs  Lab 05/10/23 1244 05/11/23 0257  AST 33 20  ALT 19 18  ALKPHOS 64 63  BILITOT 1.0 0.6  PROT 7.8 6.9  ALBUMIN 4.4 4.1   No results for input(s): "LIPASE", "AMYLASE" in the last 168 hours. No results for input(s): "AMMONIA" in the last 168 hours. Coagulation profile Recent Labs  Lab 05/10/23 0922  INR 1.1    CBC: Recent Labs  Lab 05/10/23 0922 05/10/23 1244 05/11/23 0257  WBC 8.7 9.8 9.6  NEUTROABS  --  7.4  --   HGB 10.1* 10.2* 9.1*  HCT 31.6* 32.1* 28.0*  MCV 85.6 86.1 86.2  PLT 300 313 297   Cardiac Enzymes: No results for input(s): "CKTOTAL", "CKMB", "CKMBINDEX", "TROPONINI" in the last 168 hours. BNP (last 3 results) No results for input(s): "PROBNP" in the last 8760 hours. CBG: No results for input(s): "GLUCAP" in the last 168 hours. D-Dimer: No results for input(s): "DDIMER" in the last 72 hours. Hgb A1c: No results for input(s): "HGBA1C" in the last 72 hours. Lipid Profile: No results  for input(s): "CHOL", "HDL", "LDLCALC", "TRIG", "CHOLHDL", "LDLDIRECT" in the last 72 hours. Thyroid function studies: No results for input(s): "TSH", "T4TOTAL", "T3FREE", "THYROIDAB" in the last 72 hours.  Invalid input(s): "FREET3" Anemia work up: No results for input(s): "VITAMINB12", "FOLATE", "FERRITIN", "TIBC", "IRON", "RETICCTPCT" in the last 72 hours. Sepsis Labs: Recent Labs  Lab 05/10/23 0922 05/10/23 1244 05/11/23 0257  WBC 8.7 9.8 9.6    Microbiology No results found for this or any previous visit (from the past 240 hour(s)).  Procedures and diagnostic studies:  CT HEAD WO CONTRAST ( )  Result Date: 05/12/2023 CLINICAL DATA:  Initial evaluation for mental status change, unknown cause. EXAM: CT HEAD WITHOUT CONTRAST TECHNIQUE: Contiguous axial images were obtained from the base of the skull through the vertex without  intravenous contrast. RADIATION DOSE REDUCTION: This exam was performed according to the departmental dose-optimization program which includes automated exposure control, adjustment of the mA and/or kV according to patient size and/or use of iterative reconstruction technique. COMPARISON:  Comparison made with prior MRI from 04/20/2023. FINDINGS: Brain: Cerebral volume within normal limits. 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 No acute intracranial hemorrhage. No acute large vessel territory infarct. No mass lesion or midline shift. No hydrocephalus or extra-axial fluid collection. Vascular: No abnormal hyperdense vessel. Scattered vascular calcifications noted within the carotid siphons. Skull: Scalp soft tissues demonstrate no acute finding. Calvarium intact. Sinuses/Orbits: Globes and orbital soft tissues within normal limits. Scattered mucosal thickening present about the ethmoidal air cells and maxillary sinuses. No significant mastoid effusion. Other: None. IMPRESSION: 1. No acute intracranial abnormality. 2. 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 Electronically Signed   By: Rise Mu M.D.   On: 05/12/2023 01:46   DG Chest Port 1 View  Result Date: 05/11/2023 CLINICAL DATA:  Pneumothorax EXAM: PORTABLE CHEST 1 VIEW COMPARISON:  Chest x-ray 05/11/2023.  Chest x-ray 05/11/2023. FINDINGS: The heart size and mediastinal contours are within normal limits. Right middle lobe masslike opacity is unchanged. Large bullae noted in the lateral right lower hemithorax is unchanged. No definite pneumothorax or pleural effusion identified. No new focal lung infiltrate. No acute fractures. IMPRESSION: 1. No definite pneumothorax identified. 2. Right middle lobe masslike opacities unchanged. Electronically Signed   By: Darliss Cheney M.D.   On: 05/11/2023 23:43   CT CHEST WO CONTRAST  Result Date:  05/11/2023 CLINICAL DATA:  Follow-up pneumothorax EXAM: CT CHEST WITHOUT CONTRAST TECHNIQUE: Multidetector CT imaging of the chest was performed following the standard protocol without IV contrast. RADIATION DOSE REDUCTION: This exam was performed according to the departmental dose-optimization program which includes automated exposure control, adjustment of the mA and/or kV according to patient size and/or use of iterative reconstruction technique. COMPARISON:  CT 05/10/2023, chest x-ray 05/11/2023 FINDINGS: Cardiovascular: Limited evaluation without intravenous contrast. Advanced aortic atherosclerosis. No aneurysm. Coronary vascular disease. Normal cardiac size. No pericardial effusion Mediastinum/Nodes: Midline trachea. No thyroid mass. Enlarged right hilar nodes better seen on prior contrast enhanced examination. Enlarged right precarinal lymph node measuring 2.1 cm, previously 1.8 cm, series 2, image 51. Esophagus within normal limits. Lungs/Pleura: Emphysema. Large right middle lobe lung mass measuring approximately 7.7 by 6.5 cm on series 4, image 98, stable in the short interval follow-up, prior maximum measurement of 7.7 cm. Very tiny pneumothorax in the right middle lobe anterior to the lung mass, series 4, image 79, not significantly increased in size. Small volume right pleural effusion slightly increased, now measuring closer  to water density. Right lower lobe posterior pulmonary nodules are more obscured by pleural fluid and atelectasis. Stable 7 mm medial left lung base pulmonary nodule series 4, image 127. Upper Abdomen: Bilateral adrenal nodules, measuring 2.5 cm on the right with density value of -6.7. Left adrenal nodule with density value of 0.7, this measures 2 cm. Findings would be consistent with benign adenoma. Hyperdensity in the gallbladder could reflect vicarious excretion of contrast. No calcified stones seen on the recent prior CT. Enlarged porta hepatis node measuring 19 mm, previously  17 mm. Musculoskeletal: Post augmentation changes T7 and T11. chronic bilateral scapular fractures IMPRESSION: 1. Very tiny right anterior pneumothorax, not significantly increased in size. 2. Small volume right pleural effusion slightly increased in size, but now measuring closer to water density. Short-term stability of right middle lobe large lung mass measuring up to 7.7 cm. 3. Right lower lobe suspected metastatic pulmonary nodules are more obscured by pleural fluid and atelectasis on this exam. Stable 7 mm medial left lung base pulmonary nodule. 4. Enlarged mediastinal and right hilar nodes as seen on prior contrast enhanced exam, concerning for metastatic disease. 5. Bilateral adrenal nodules consistent with benign adenomas. Aortic Atherosclerosis (ICD10-I70.0) and Emphysema (ICD10-J43.9). Electronically Signed   By: Jasmine Pang M.D.   On: 05/11/2023 17:44   [place] RT LOWER EXTREM LTD SOFT TISSUE NON VASCULAR  Result Date: 05/11/2023 CLINICAL DATA:  Foot swelling.  Trauma. EXAM: ULTRASOUND right LOWER EXTREMITY LIMITED TECHNIQUE: Ultrasound examination of the lower extremity soft tissues was performed in the area of clinical concern. COMPARISON:  Foot x-ray 05/11/2023 FINDINGS: Specific ultrasound in the area of concern about the dorsal aspect of the foot demonstrates oblong heterogeneous area which is hypoechoic measuring 3.5 x 1.0 by 4.2 cm. No specific blood flow on Doppler. Margins are somewhat ill-defined. This corresponds to the area of thickening on x-ray. IMPRESSION: 4.2 cm lobular hypoechoic area along the dorsal aspect of the right foot. Possibilities with the patient's history would include hematoma but there is a differential by ultrasound. Recommend either follow-up to confirm resolution and exclude underlying lesion versus additional workup with MRI as clinically appropriate. Electronically Signed   By: Karen Kays M.D.   On: 05/11/2023 16:12   DG Foot Complete Right  Result Date:  05/11/2023 CLINICAL DATA:  Foot swelling EXAM: RIGHT FOOT COMPLETE - 3+ VIEW COMPARISON:  None Available. FINDINGS: No acute displaced fracture or malalignment. Possible remote fracture involving the fifth proximal phalanx. Mild degenerative change at the first MTP joint. Prominent dorsal soft tissue swelling IMPRESSION: Prominent dorsal soft tissue swelling. No acute osseous abnormality. Electronically Signed   By: Jasmine Pang M.D.   On: 05/11/2023 15:47   DG Chest 2 View  Result Date: 05/11/2023 CLINICAL DATA:  Follow-up pneumothorax EXAM: CHEST - 2 VIEW COMPARISON:  CT 05/10/2023 FINDINGS: Emphysema. Large bulla in the right lower lung. Small right-sided pleural effusion. Atelectasis or scarring in the left lower lung. Large right middle lobe lung mass. No visible pneumothorax. Right scapular fracture with callus. IMPRESSION: 1. No visible pneumothorax. 2. Large right middle lobe lung mass. 3. Small right pleural effusion. 4. Emphysema. Electronically Signed   By: Jasmine Pang M.D.   On: 05/11/2023 15:43   CT CHEST ABDOMEN PELVIS W CONTRAST  Result Date: 05/10/2023 CLINICAL DATA:  Chest pain and abdominal pain and shortness of breath following lung biopsy EXAM: CT CHEST, ABDOMEN, AND PELVIS WITH CONTRAST TECHNIQUE: Multidetector CT imaging of the chest, abdomen and pelvis was performed  following the standard protocol during bolus administration of intravenous contrast. RADIATION DOSE REDUCTION: This exam was performed according to the departmental dose-optimization program which includes automated exposure control, adjustment of the mA and/or kV according to patient size and/or use of iterative reconstruction technique. CONTRAST:  OMNIPAQUE IOHEXOL 300 MG/ML  SOLN COMPARISON:  PET-CT 04/19/2023, CT 04/03/2023 FINDINGS: CT CHEST FINDINGS Cardiovascular: Heart size within normal limits. No pericardial effusion. Thoracic aorta is nonaneurysmal. Extensive atherosclerotic calcifications of the aorta  and coronary arteries. Central pulmonary vasculature is within normal limits. Mediastinum/Nodes: Pathologically enlarged mediastinal and right hilar lymph nodes including reference precarinal node measuring 1.8 cm short axis (previously 1.7 cm) and 1.5 cm right hilar node. No axillary or left hilar lymphadenopathy. Lungs/Pleura: Small right pleural effusion with internal density of 40 HU, new since 04/19/2023, which could be related to malignant effusion versus small hemothorax in the setting of recent lung biopsy. Trace right pneumothorax anteriorly adjacent to the large right lung mass (series 4, image 89). Right middle lobe lung mass has slightly increased in size since 04/19/2023, now measuring 7.7 x 6.0 cm transaxially (previously 7.1 x 5.5 cm). Continued enlargement of 2 right lower lobe nodules measuring 2.2 cm and 1.3 cm (previously 1.8 cm and 1.0 cm). New 0.7 cm left lower lobe nodule (series 4, image 128). Background of advanced emphysema. Large bulla in the right middle lobe. Musculoskeletal: Prior cement augmentation of the T6 and T10 vertebral bodies. Chronic bilateral scapular fractures. No discrete lytic or sclerotic bone lesion. No chest wall hematoma. CT ABDOMEN PELVIS FINDINGS Hepatobiliary: Subcentimeter low-density lesion within the right hepatic lobe is too small to characterize (series 2, image 54). Liver appears otherwise unremarkable. Unremarkable gallbladder. No hyperdense gallstone. No biliary dilatation. Pancreas: Unremarkable. No pancreatic ductal dilatation or surrounding inflammatory changes. Spleen: Normal in size without focal abnormality. Adrenals/Urinary Tract: Stable benign adrenal adenomas which do not require follow-up imaging. Areas of cortical scarring of both kidneys. No renal stone or hydronephrosis. Urinary bladder within normal limits. Stomach/Bowel: Stomach is within normal limits. Scattered colonic diverticulosis. No evidence of bowel wall thickening, distention, or  inflammatory changes. Vascular/Lymphatic: Aortic atherosclerosis. Enlarged lymph node in the porta hepatis measuring 1.6 cm. No enlarged pelvic lymph nodes. Reproductive: Prostate is unremarkable. Other: No free fluid. No abdominopelvic fluid collection. No pneumoperitoneum. Musculoskeletal: No acute or significant osseous findings. IMPRESSION: 1. Trace right pneumothorax anteriorly adjacent to the large right lung mass. 2. Small intermediate-high density right pleural effusion, new since 04/19/2023, which could be related to malignant effusion versus small hemothorax in the setting of recent lung biopsy. 3. Right middle lobe lung mass has slightly increased in size since 04/19/2023, now measuring 7.7 x 6.0 cm transaxially (previously 7.1 x 5.5 cm). 4. Continued enlargement of two right lower lobe nodules measuring 2.2 cm and 1.3 cm (previously 1.8 cm and 1.0 cm). New 0.7 cm left lower lobe nodule. Findings are compatible with progression of metastatic disease. 5. Pathologically enlarged mediastinal, right hilar, and upper abdominal lymph nodes, also compatible with metastatic disease. Aortic Atherosclerosis (ICD10-I70.0) and Emphysema (ICD10-J43.9). Electronically Signed   By: Duanne Guess D.O.   On: 05/10/2023 15:11               LOS: 1 day   Pennie Banter, DO  [facility]   Pager on [email or web address]. If 7PM-7AM, please contact night-coverage at [email or web address]     05/12/2023, 1:11 PM

## 2023-05-12 NOTE — Consult Note (Signed)
PODIATRY CONSULTATION  NAME Austin Patterson MRN [id number] DOB [birth year] DOA 05/10/2023   Reason for consult:  Chief Complaint  Patient presents with   Shortness of Breath    Consulting physician: Lurene Shadow MD, [facility]  History of present illness: 67 y.o. male PMHx HTN, alcohol use, CAD on anticoagulant admitted for complications secondary to lung biopsy and pleuritic chest pain.  While admitted the patient states that he dropped his oxygen tank on his right foot.  X-ray and ultrasound was performed and podiatry was consulted.  Past Medical History:  Diagnosis Date   Abdominal pain 12/07/2022   Acute urinary retention 12/07/2022   COPD (chronic obstructive pulmonary disease) (HCC)    GERD (gastroesophageal reflux disease)    Hyperlipidemia    Hypertension    Pulmonary fibrosis (HCC) 11/2015       Latest Ref Rng & Units 05/11/2023    2:57 AM 05/10/2023   12:44 PM 05/10/2023    9:22 AM  CBC  WBC 4.0 - 10.5 K/uL 9.6  9.8  8.7   Hemoglobin 13.0 - 17.0 g/dL 9.1  86.5  78.4   Hematocrit 39.0 - 52.0 % 28.0  32.1  31.6   Platelets 150 - 400 K/uL 297  313  300        Latest Ref Rng & Units 05/11/2023    2:57 AM 05/10/2023   12:44 PM 04/04/2023    5:37 AM  BMP  Glucose 70 - 99 mg/dL 696  295  284   BUN 8 - 23 mg/dL 18  12  15    Creatinine 0.61 - 1.24 mg/dL 1.32  4.40  1.02   Sodium 135 - 145 mmol/L 130  129  129   Potassium 3.5 - 5.1 mmol/L 4.1  4.7  3.7   Chloride 98 - 111 mmol/L 92  92  89   CO2 22 - 32 mmol/L 29  27  28    Calcium 8.9 - 10.3 mg/dL 8.8  9.2  8.9     RT foot 05/11/2023  Physical Exam: General: The patient is alert and oriented x3 in no acute distress.   Dermatology: Skin is warm, dry and supple bilateral lower extremities.  Skin integrity is not compromised with a hematoma  Vascular: Palpable pedal pulses bilaterally.  There is some ecchymosis noted to the toes secondary to blood pooling from the blunt trauma injury  Neurological:  Light touch and protective threshold grossly intact bilaterally.   Musculoskeletal Exam: No structural deformity noted.  Associated tenderness around the right foot  DG Foot Complete Right 05/11/2023 FINDINGS: No acute displaced fracture or malalignment. Possible remote fracture involving the fifth proximal phalanx. Mild degenerative change at the first MTP joint. Prominent dorsal soft tissue swelling IMPRESSION: Prominent dorsal soft tissue swelling. No acute osseous abnormality.  [place] RT LOWER EXTREM LTD SOFT TISSUE NON VASCULAR 05/11/2023 FINDINGS: Specific ultrasound in the area of concern about the dorsal aspect of the foot demonstrates oblong heterogeneous area which is hypoechoic measuring 3.5 x 1.0 by 4.2 cm. No specific blood flow on Doppler. Margins are somewhat ill-defined. This corresponds to the area of thickening on x-ray.   IMPRESSION: 4.2 cm lobular hypoechoic area along the dorsal aspect of the right foot. Possibilities with the patient's history would include hematoma but there is a differential by ultrasound. Recommend either follow-up to confirm resolution and exclude underlying lesion versus additional workup with MRI as clinically appropriate.  ASSESSMENT/PLAN OF CARE Hematoma secondary to blunt trauma right dorsal  foot  -Patient was evaluated at bedside this evening -Currently there is no concern for soft tissue compromise.  Skin integrity is intact.  For now we will simply observe to see if the hematoma resorbs/resolves with conservative care.  Patient is on anticoagulant and invasive evacuation of the hematoma could potentially create more soft tissue damage and additional bleeding.  Conservative care -RICE -Recommend Ace wrap daily -Follow-up outpatient postdischarge -Podiatry will sign off   Thank you for the consult.  Please contact me directly via secure chat with any questions or concerns.     Felecia Shelling, DPM [facility]  Dr. Felecia Shelling, DPM    [address] [place], [place] [address]                Office [phone]  Fax [phone]

## 2023-05-13 DIAGNOSIS — J95811 Postprocedural pneumothorax: Secondary | ICD-10-CM | POA: Diagnosis not present

## 2023-05-13 DIAGNOSIS — G8918 Other acute postprocedural pain: Principal | ICD-10-CM

## 2023-05-13 DIAGNOSIS — R0781 Pleurodynia: Secondary | ICD-10-CM | POA: Diagnosis not present

## 2023-05-13 DIAGNOSIS — J9621 Acute and chronic respiratory failure with hypoxia: Secondary | ICD-10-CM | POA: Diagnosis not present

## 2023-05-13 DIAGNOSIS — J441 Chronic obstructive pulmonary disease with (acute) exacerbation: Secondary | ICD-10-CM | POA: Diagnosis not present

## 2023-05-13 HISTORY — DX: Other acute postprocedural pain: G89.18

## 2023-05-13 LAB — CBC
HCT: 28 % — ABNORMAL LOW (ref 39.0–52.0)
Hemoglobin: 9.1 g/dL — ABNORMAL LOW (ref 13.0–17.0)
MCH: 27.8 pg (ref 26.0–34.0)
MCHC: 32.5 g/dL (ref 30.0–36.0)
MCV: 85.6 fL (ref 80.0–100.0)
Platelets: 355 10*3/uL (ref 150–400)
RDW: 17.9 % — ABNORMAL HIGH (ref 11.5–15.5)
WBC: 9.6 10*3/uL (ref 4.0–10.5)
nRBC: 0 % (ref 0.0–0.2)

## 2023-05-13 LAB — BASIC METABOLIC PANEL
Anion gap: 11 (ref 5–15)
BUN: 19 mg/dL (ref 8–23)
CO2: 31 mmol/L (ref 22–32)
Calcium: 9.1 mg/dL (ref 8.9–10.3)
Chloride: 90 mmol/L — ABNORMAL LOW (ref 98–111)
GFR, Estimated: >60 mL/min (ref >60)
Glucose, Bld: 112 mg/dL — ABNORMAL HIGH (ref 70–99)
Potassium: 3.9 mmol/L (ref 3.5–5.1)
Sodium: 132 mmol/L — ABNORMAL LOW (ref 135–145)

## 2023-05-13 MED ORDER — SALINE SPRAY 0.65 % NA SOLN
1.0000 | NASAL | Status: DC | PRN
  Administered 2023-05-13 (×2): 1 via NASAL
  Filled 2023-05-13: qty 44

## 2023-05-13 MED ORDER — GUAIFENESIN 100 MG/5ML PO LIQD
15.0000 mL | Freq: Four times a day (QID) | ORAL | Status: DC | PRN
  Administered 2023-05-13: 15 mL via ORAL
  Filled 2023-05-13 (×2): qty 20

## 2023-05-13 MED ORDER — ACETAMINOPHEN 325 MG PO TABS
650.0000 mg | ORAL_TABLET | Freq: Four times a day (QID) | ORAL | Status: DC | PRN
  Administered 2023-05-13: 650 mg via ORAL
  Filled 2023-05-13: qty 2

## 2023-05-13 NOTE — Progress Notes (Signed)
Progress Note    Austin Patterson  MVH:[id number] DOB: 01/13/1956  DOA: 05/10/2023 PCP: Pcp, No      Brief Narrative:    Medical records reviewed and are as summarized below:  Austin Patterson is a 67 y.o. 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  He presented to the hospital with right-sided pleuritic chest pain, shortness of breath after he had a lung biopsy by IR on 05/10/2023.  Lung biopsy was done on a recently diagnosed right middle lobe lung mass.   He was found to have trace right pneumothorax and right pleural effusion concerning for small hemothorax  Assessment/Plan:   Principal Problem:   Pleuritic pain Active Problems:   CAD (coronary artery disease)   Chest pain   Chronic respiratory failure with hypoxia (HCC)   COPD exacerbation (HCC)   Hyponatremia   Hyperlipidemia   Asthma-COPD overlap syndrome   GERD without esophagitis   Lung nodule seen on imaging study   Heterozygous alpha 1-antitrypsin deficiency (HCC)   Pneumothorax after biopsy   Hemothorax on right   Swelling of right foot   Post procedure discomfort    Trace right pneumothorax, small right hemothorax: Iatrogenic s/p lung biopsy. --Pulmonology consulted, Dr. Jayme Cloud - appreciate recommendations 7/23 - CT chest without contrast showed very tiny right anterior PTX, not significantly increased in size.  Small volume right pleural effusion (closer to water density), stable 7.7 cm RML mass.  RLL nodules suspicious for mets, enlarged mediastinal and right hilar nodes also suspicious for metastatic disease. --No need for chest tube --Continue to monitor  closely   Right middle lobe lung mass: Biopsy was done on 05/10/23 with IR. 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   Acute on Chronic respiratory failure with hypoxia - baseline on 2 L/min O2, required up to 4 L/min. Due to COPD exacerbation and pneumothorax 7/23 - on baseline oxygen this AM 7/25 - O2 desat on exertion with prolonged recovery --Titrate O2 for sats 88-92% --Pulmonology following --On neb treatments: Brovana, Pulmicort, Yupelri --PRN albuterol nebs --Prednisone 20 mg PO daily x 5 days  COPD exacerbation: Treat with bronchodilators and steroids.  Use only 20 mg of prednisone.  Patient is concerned about higher doses of prednisone because he thinks that caused him to have back problems.   Right foot hematoma: right foot swelling and tenderness.   Pt reported on oxygen tank fell on his right foot about 1 and half weeks prior to admission.   X-ray and ultrasound obtained - results consistent with likely hematoma. X-ray showed prominent dorsal soft tissue swelling, possible remote fracture of the fifth proximal phalanx. Ultrasound showed 4.2 cm lobular hypoechoic area along the dorsal aspect of right foot with differential diagnosis including hematoma. --Podiatry consulted, Dr. Clayburn Pert - follow up recommendations   Other comorbidities include:  CAD GERD Chronic hyponatremia - Na stable 129-130. History of asthma COPD overlap syndrome Chronic anemia  Obesity  - Body mass index is 31.49 kg/m. Complicates overall care and prognosis.  Recommend lifestyle modifications including physical activity and diet for weight loss and overall long-term health.    Consultants: Pulmonologist  Procedures: None   Family Communication/Anticipated D/C date and  plan/Code Status   DVT prophylaxis: SCDs Start: 05/10/23 1709     Code Status: Full Code  Family Communication: None Disposition Plan: Plan to  discharge home in 1 to 2 days   Status is: Inpatient Remains inpatient appropriate because: Ongoing significant dyspnea and O2 desats with exertion.  Warrants further close monitoring for improvement and stability to prevent readmission or further morbidity  Expect potential d/c in 24-48 hours.    Subjective:   Pt seated edge of bed, working with PT when seen today.  His O2 dropped into 80's with attempt at activity, he is seated and slowly recovering.  He denies other acute copmlaints.  Says he was wheezing earlier but better after neb treatment.     Objective:    Vitals:   05/12/23 1547 05/12/23 2028 05/13/23 0005 05/13/23 0732  BP: 126/63  117/66 (!) 116/48  Pulse: 83  77 78  Resp: 16  20 18   Temp: 98.6 F (37 C)  98.1 F (36.7 C) (!) 97.4 F (36.3 C)  TempSrc:      SpO2: 91% 92% 91% 92%  Weight:       No data found.   Intake/Output Summary (Last 24 hours) at 05/13/2023 1327 Last data filed at 05/13/2023 1051 Gross per 24 hour  Intake 1200 ml  Output 1900 ml  Net -700 ml   Filed Weights   05/10/23 1757  Weight: 88.5 kg    Exam:  General exam: awake, alert, no acute distress HEENT: nasal cannula in place, moist mucus membranes, hearing grossly normal  Respiratory system: CTAB with poor aeration, no wheezes, no rhonchi, mildly increased respiratory effort at rest, on 3 L/min [place] O2 (just seated from activity with PT) Cardiovascular system: normal S1/S2, RRR, no pedal edema.   Gastrointestinal system: soft, NT, ND Central nervous system: A&O x 3. no gross focal neurologic deficits, normal speech Extremities: focal area of swelling and tenderness on dorsal aspect right foot (hematoma) feels stable minimally fluctuant with no differential warmth of skin on palpation Skin: dry, intact, normal temperature Psychiatry: normal mood, congruent affect, judgement and insight appear normal    Data Reviewed:   I have personally reviewed following labs and imaging  studies:  Labs: Labs show the following:   Basic Metabolic Panel: Recent Labs  Lab 05/10/23 1244 05/11/23 0257 05/13/23 0514  NA 129* 130* 132*  K 4.7 4.1 3.9  CL 92* 92* 90*  CO2 27 29 31   GLUCOSE 135* 115* 112*  BUN 12 18 19   CREATININE 0.70 0.92 0.68  CALCIUM 9.2 8.8* 9.1   GFR Estimated Creatinine Clearance: 93.4 mL/min (by C-G formula based on SCr of 0.68 mg/dL). Liver Function Tests: Recent Labs  Lab 05/10/23 1244 05/11/23 0257  AST 33 20  ALT 19 18  ALKPHOS 64 63  BILITOT 1.0 0.6  PROT 7.8 6.9  ALBUMIN 4.4 4.1   No results for input(s): "LIPASE", "AMYLASE" in the last 168 hours. No results for input(s): "AMMONIA" in the last 168 hours. Coagulation profile Recent Labs  Lab 05/10/23 0922  INR 1.1    CBC: Recent Labs  Lab 05/10/23 0922 05/10/23 1244 05/11/23 0257 05/13/23 0514  WBC 8.7 9.8 9.6 9.6  NEUTROABS  --  7.4  --   --   HGB 10.1* 10.2* 9.1* 9.1*  HCT 31.6* 32.1* 28.0* 28.0*  MCV 85.6 86.1 86.2 85.6  PLT 300 313 297 355   Cardiac Enzymes: No results for input(s): "CKTOTAL", "CKMB", "CKMBINDEX", "TROPONINI" in the last  168 hours. BNP (last 3 results) No results for input(s): "PROBNP" in the last 8760 hours. CBG: No results for input(s): "GLUCAP" in the last 168 hours. D-Dimer: No results for input(s): "DDIMER" in the last 72 hours. Hgb A1c: No results for input(s): "HGBA1C" in the last 72 hours. Lipid Profile: No results for input(s): "CHOL", "HDL", "LDLCALC", "TRIG", "CHOLHDL", "LDLDIRECT" in the last 72 hours. Thyroid function studies: No results for input(s): "TSH", "T4TOTAL", "T3FREE", "THYROIDAB" in the last 72 hours.  Invalid input(s): "FREET3" Anemia work up: No results for input(s): "VITAMINB12", "FOLATE", "FERRITIN", "TIBC", "IRON", "RETICCTPCT" in the last 72 hours. Sepsis Labs: Recent Labs  Lab 05/10/23 0922 05/10/23 1244 05/11/23 0257 05/13/23 0514  WBC 8.7 9.8 9.6 9.6    Microbiology No results found for this  or any previous visit (from the past 240 hour(s)).  Procedures and diagnostic studies:  CT HEAD WO CONTRAST ( )  Result Date: 05/12/2023 CLINICAL DATA:  Initial evaluation for mental status change, unknown cause. EXAM: CT HEAD WITHOUT CONTRAST TECHNIQUE: Contiguous axial images were obtained from the base of the skull through the vertex without intravenous contrast. RADIATION DOSE REDUCTION: This exam was performed according to the departmental dose-optimization program which includes automated exposure control, adjustment of the mA and/or kV according to patient size and/or use of iterative reconstruction technique. COMPARISON:  Comparison made with prior MRI from 04/20/2023. FINDINGS: Brain: Cerebral volume within normal limits. 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 No acute intracranial hemorrhage. No acute large vessel territory infarct. No mass lesion or midline shift. No hydrocephalus or extra-axial fluid collection. Vascular: No abnormal hyperdense vessel. Scattered vascular calcifications noted within the carotid siphons. Skull: Scalp soft tissues demonstrate no acute finding. Calvarium intact. Sinuses/Orbits: Globes and orbital soft tissues within normal limits. Scattered mucosal thickening present about the ethmoidal air cells and maxillary sinuses. No significant mastoid effusion. Other: None. IMPRESSION: 1. No acute intracranial abnormality. 2. 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 Electronically Signed   By: Rise Mu M.D.   On: 05/12/2023 01:46   DG Chest Port 1 View  Result Date: 05/11/2023 CLINICAL DATA:  Pneumothorax EXAM: PORTABLE CHEST 1 VIEW COMPARISON:  Chest x-ray 05/11/2023.  Chest x-ray 05/11/2023. FINDINGS: The heart size and mediastinal contours are within normal limits. Right middle lobe masslike opacity is unchanged. Large bullae noted in the lateral right  lower hemithorax is unchanged. No definite pneumothorax or pleural effusion identified. No new focal lung infiltrate. No acute fractures. IMPRESSION: 1. No definite pneumothorax identified. 2. Right middle lobe masslike opacities unchanged. Electronically Signed   By: Darliss Cheney M.D.   On: 05/11/2023 23:43   CT CHEST WO CONTRAST  Result Date: 05/11/2023 CLINICAL DATA:  Follow-up pneumothorax EXAM: CT CHEST WITHOUT CONTRAST TECHNIQUE: Multidetector CT imaging of the chest was performed following the standard protocol without IV contrast. RADIATION DOSE REDUCTION: This exam was performed according to the departmental dose-optimization program which includes automated exposure control, adjustment of the mA and/or kV according to patient size and/or use of iterative reconstruction technique. COMPARISON:  CT 05/10/2023, chest x-ray 05/11/2023 FINDINGS: Cardiovascular: Limited evaluation without intravenous contrast. Advanced aortic atherosclerosis. No aneurysm. Coronary vascular disease. Normal cardiac size. No pericardial effusion Mediastinum/Nodes: Midline trachea. No thyroid mass. Enlarged right hilar nodes better seen on prior contrast enhanced examination. Enlarged right precarinal lymph node measuring 2.1 cm, previously 1.8 cm, series 2, image 51. Esophagus within normal limits. Lungs/Pleura: Emphysema. Large right middle lobe  lung mass measuring approximately 7.7 by 6.5 cm on series 4, image 98, stable in the short interval follow-up, prior maximum measurement of 7.7 cm. Very tiny pneumothorax in the right middle lobe anterior to the lung mass, series 4, image 79, not significantly increased in size. Small volume right pleural effusion slightly increased, now measuring closer to water density. Right lower lobe posterior pulmonary nodules are more obscured by pleural fluid and atelectasis. Stable 7 mm medial left lung base pulmonary nodule series 4, image 127. Upper Abdomen: Bilateral adrenal nodules,  measuring 2.5 cm on the right with density value of -6.7. Left adrenal nodule with density value of 0.7, this measures 2 cm. Findings would be consistent with benign adenoma. Hyperdensity in the gallbladder could reflect vicarious excretion of contrast. No calcified stones seen on the recent prior CT. Enlarged porta hepatis node measuring 19 mm, previously 17 mm. Musculoskeletal: Post augmentation changes T7 and T11. chronic bilateral scapular fractures IMPRESSION: 1. Very tiny right anterior pneumothorax, not significantly increased in size. 2. Small volume right pleural effusion slightly increased in size, but now measuring closer to water density. Short-term stability of right middle lobe large lung mass measuring up to 7.7 cm. 3. Right lower lobe suspected metastatic pulmonary nodules are more obscured by pleural fluid and atelectasis on this exam. Stable 7 mm medial left lung base pulmonary nodule. 4. Enlarged mediastinal and right hilar nodes as seen on prior contrast enhanced exam, concerning for metastatic disease. 5. Bilateral adrenal nodules consistent with benign adenomas. Aortic Atherosclerosis (ICD10-I70.0) and Emphysema (ICD10-J43.9). Electronically Signed   By: Jasmine Pang M.D.   On: 05/11/2023 17:44   [place] RT LOWER EXTREM LTD SOFT TISSUE NON VASCULAR  Result Date: 05/11/2023 CLINICAL DATA:  Foot swelling.  Trauma. EXAM: ULTRASOUND right LOWER EXTREMITY LIMITED TECHNIQUE: Ultrasound examination of the lower extremity soft tissues was performed in the area of clinical concern. COMPARISON:  Foot x-ray 05/11/2023 FINDINGS: Specific ultrasound in the area of concern about the dorsal aspect of the foot demonstrates oblong heterogeneous area which is hypoechoic measuring 3.5 x 1.0 by 4.2 cm. No specific blood flow on Doppler. Margins are somewhat ill-defined. This corresponds to the area of thickening on x-ray. IMPRESSION: 4.2 cm lobular hypoechoic area along the dorsal aspect of the right foot.  Possibilities with the patient's history would include hematoma but there is a differential by ultrasound. Recommend either follow-up to confirm resolution and exclude underlying lesion versus additional workup with MRI as clinically appropriate. Electronically Signed   By: Karen Kays M.D.   On: 05/11/2023 16:12          LOS: 2 days   Pennie Banter, DO  [facility]   Pager on [email or web address]. If 7PM-7AM, please contact night-coverage at [email or web address]     05/13/2023, 1:27 PM

## 2023-05-13 NOTE — Plan of Care (Signed)
  Problem: Education: Goal: Knowledge of General Education information will improve Description: Including pain rating scale, medication(s)/side effects and non-pharmacologic comfort measures 05/13/2023 1117 by Suezanne Cheshire, RN Outcome: Progressing 05/13/2023 1116 by Suezanne Cheshire, RN Outcome: Progressing   Problem: Health Behavior/Discharge Planning: Goal: Ability to manage health-related needs will improve 05/13/2023 1117 by Suezanne Cheshire, RN Outcome: Progressing 05/13/2023 1116 by Suezanne Cheshire, RN Outcome: Progressing   Problem: Clinical Measurements: Goal: Ability to maintain clinical measurements within normal limits will improve 05/13/2023 1117 by Suezanne Cheshire, RN Outcome: Progressing 05/13/2023 1116 by Suezanne Cheshire, RN Outcome: Progressing Goal: Will remain free from infection 05/13/2023 1117 by Suezanne Cheshire, RN Outcome: Progressing 05/13/2023 1116 by Suezanne Cheshire, RN Outcome: Progressing Goal: Diagnostic test results will improve 05/13/2023 1117 by Suezanne Cheshire, RN Outcome: Progressing 05/13/2023 1116 by Suezanne Cheshire, RN Outcome: Progressing Goal: Respiratory complications will improve 05/13/2023 1117 by Suezanne Cheshire, RN Outcome: Progressing 05/13/2023 1116 by Suezanne Cheshire, RN Outcome: Progressing Goal: Cardiovascular complication will be avoided 05/13/2023 1117 by Suezanne Cheshire, RN Outcome: Progressing 05/13/2023 1116 by Suezanne Cheshire, RN Outcome: Progressing   Problem: Activity: Goal: Risk for activity intolerance will decrease 05/13/2023 1117 by Suezanne Cheshire, RN Outcome: Progressing 05/13/2023 1116 by Suezanne Cheshire, RN Outcome: Progressing   Problem: Nutrition: Goal: Adequate nutrition will be maintained 05/13/2023 1117 by Suezanne Cheshire, RN Outcome: Progressing 05/13/2023 1116 by Suezanne Cheshire, RN Outcome: Progressing   Problem: Coping: Goal: Level of anxiety will  decrease 05/13/2023 1117 by Suezanne Cheshire, RN Outcome: Progressing 05/13/2023 1116 by Suezanne Cheshire, RN Outcome: Progressing   Problem: Elimination: Goal: Will not experience complications related to bowel motility 05/13/2023 1117 by Suezanne Cheshire, RN Outcome: Progressing 05/13/2023 1116 by Suezanne Cheshire, RN Outcome: Progressing Goal: Will not experience complications related to urinary retention 05/13/2023 1117 by Suezanne Cheshire, RN Outcome: Progressing 05/13/2023 1116 by Suezanne Cheshire, RN Outcome: Progressing   Problem: Pain Managment: Goal: General experience of comfort will improve 05/13/2023 1117 by Suezanne Cheshire, RN Outcome: Progressing 05/13/2023 1116 by Suezanne Cheshire, RN Outcome: Progressing   Problem: Safety: Goal: Ability to remain free from injury will improve 05/13/2023 1117 by Suezanne Cheshire, RN Outcome: Progressing 05/13/2023 1116 by Suezanne Cheshire, RN Outcome: Progressing   Problem: Skin Integrity: Goal: Risk for impaired skin integrity will decrease 05/13/2023 1117 by Suezanne Cheshire, RN Outcome: Progressing 05/13/2023 1116 by Suezanne Cheshire, RN Outcome: Progressing

## 2023-05-13 NOTE — Progress Notes (Signed)
NAME:  Austin Patterson, MRN:  [id number], DOB:  [birth year], LOS: 2 ADMISSION DATE:  05/10/2023, CONSULTATION DATE: 11 May 2023 REFERRING MD: Austin Shadow, MD, CHIEF COMPLAINT: Wheezing and shortness of breath.  History of Present Illness:  Austin Patterson is a 67 year old former smoker (100 TY) well-known to [facility], with stage IV very severe COPD, severe persistent asthma and chronic respiratory failure with hypoxia who was admitted yesterday after CT-guided lung mass biopsy.  Patient was noted to have a large lung mass and mediastinal adenopathy after an admission to [facility] between June 15 through June 17.  At that time he was also diagnosed with COVID-19.  He was evaluated on follow-up outpatient visit on 9 July to determine what would be the best method for diagnosis of this mass.  The patient is not a candidate for general anesthesia which would have been required for bronchoscopy, he was then set up for transthoracic core biopsy by IR.  The dominant mass on the right was amenable to such approach.  Patient had the procedure yesterday,he developed significant pleuritic pain after the procedure.  By plain films no overt pneumothorax or effusion could be seen however on subsequent CT chest there was a small right pneumothorax and small right pleural effusion which could be related to malignant effusion versus small hemothorax after the procedure.  The patient was admitted overnight for evaluation.  Today he developed some issues with increasing shortness of breath and wheezing.  He has not been on his regular pulmonary regimen.  He has not had any fevers, chills or sweats.  No cough.  His baseline liter flow of oxygen is 2 L/min, he is up to 4 L/min being at 88%.  Normally he saturates around 91%.  He has not had any nausea or vomiting.  No abdominal pain.  His only other complaint is of pain in the dorsum of his right foot where he had an oxygen tank strike him approximately a week ago.  This area is  ecchymotic and is fluctuant.  No other complaints voiced.  Pertinent  Medical History  Stage IV very severe COPD by GOLD classification Severe persistent asthma on [facility] Chronic respiratory failure with hypoxia Heterozygous alpha 1 antitrypsin deficiency Chronic alcohol use Postinflammatory pulmonary fibrosis Chronic hyponatremia Coronary artery disease  Significant Hospital Events: Including procedures, antibiotic start and stop dates in addition to other pertinent events   05/10/2023 admitted after transthoracic needle biopsy of right lung mass, small iatrogenic pneumothorax, small hemothorax 05/11/2023 PCCM consulted for COPD exacerbation, oxygen requirements up to 4 L/min, maximized bronchodilator therapy 05/12/2023 oxygen requirements down to 2 L/min 05/13/2023 no respiratory distress, patient feels back to baseline.  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  Very alert today.  Shortness of breath markedly improved, no wheezing, no chest pain today.  Objective   Blood pressure (!) 116/48, pulse 78, temperature (!) 97.4 F (36.3 C), resp. rate 18, weight 88.5 kg, SpO2 92%.  Intake/Output Summary (Last 24 hours) at 05/13/2023 1039 Last data filed at 05/13/2023 0943 Gross per 24 hour  Intake 1200 ml  Output 2475 ml  Net -1275 ml   Filed Weights   05/10/23 1757  Weight: 88.5 kg   SpO2: 92 % O2 Flow Rate (L/min): 3 L/min  Examination: GENERAL: Chronically ill appearing gentleman, chronic use of accessories, resting quietly in bed.  Wearing oxygen via nasal  cannula.  No acute respiratory distress.  No plethora.  HEAD: Normocephalic, atraumatic. EYES: Pupils equal, round, reactive to light.  No scleral icterus. MOUTH: Teeth in poor repair, oral mucosa moist, no thrush. NECK: Supple. No thyromegaly. Trachea midline. No JVD.   No adenopathy. PULMONARY: Increased AP diameter, significant kyphosis. Distant breath sounds.  Coarse, otherwise no adventitious sounds.  Positive Hoover's sign. CARDIOVASCULAR: S1 and S2. Regular rate and rhythm.  Distant heart tones no murmur appreciated. GASTROINTESTINAL: Protuberant abdomen otherwise benign. MUSCULOSKELETAL: No clubbing, no edema.  There is ecchymosis of the dorsum of the right foot with an area of fluctuance, and no increased warmth and this resolved.  No tenderness to touch. NEUROLOGIC: No overt focal deficit.  Speech is fluent.  Awake, alert. SKIN: Intact,warm,dry. PSYCH: Mood and behavior normal.  Imaging Reviewed  Representative image of lung windows from CT performed 11 May 2023 showing almost complete resolution of tiny anterior pneumothorax and decrease in small posterior pleural effusion:    Representative image of CT performed 23 July showing no significant increase in small posterior pleural effusion on right:   Assessment & Plan:  Acute on chronic respiratory failure due to COPD exacerbation post procedure Underlying very severe stage IV COPD COPD asthma overlap Continue oxygen to maintain O2 sats between 88 to 92%, titrate as tolerated Back to baseline liter flow of 2 L/min May resume home inhaler/nebulizer regimen Home regimen: Austin Patterson 250/50, 1 puff twice a day, Austin Patterson 2- 4 times daily Continue Austin Patterson injections for asthma component 300 mg subcu q. 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 Patient made aware of results Circulogene assay has been  drawn  Has oncology follow-up 07/29 with Austin Patterson  Query cellulitis/abscess right foot Likely hematoma per ultrasound Podiatry evaluated patient No further interventions  Hyponatremia, chronic Chronic alcohol use (beer) Monitor Maintain adequate volume status  Acute on chronic anemia Anemia of chronic disease + acute blood loss anemia Anemia of chronic disease Small hemothorax post procedure Monitor Has not had indication for transfusion H&H remained stable at 9.1/28.0  Labs   CBC: Recent Labs  Lab 05/10/23 0922 05/10/23 1244 05/11/23 0257 05/13/23 0514  WBC 8.7 9.8 9.6 9.6  NEUTROABS  --  7.4  --   --   HGB 10.1* 10.2* 9.1* 9.1*  HCT 31.6* 32.1* 28.0* 28.0*  MCV 85.6 86.1 86.2 85.6  PLT 300 313 297 355    Basic Metabolic Panel: Recent Labs  Lab 05/10/23 1244 05/11/23 0257 05/13/23 0514  NA 129* 130* 132*  K 4.7 4.1 3.9  CL 92* 92* 90*  CO2 27 29 31   GLUCOSE 135* 115* 112*  BUN 12 18 19   CREATININE 0.70 0.92 0.68  CALCIUM 9.2 8.8* 9.1   GFR: Estimated Creatinine Clearance: 93.4 mL/min (by C-G formula based on SCr of 0.68 mg/dL). Recent Labs  Lab 05/10/23 0922 05/10/23 1244 05/11/23 0257 05/13/23 0514  WBC 8.7 9.8 9.6 9.6    Liver Function Tests: Recent Labs  Lab 05/10/23 1244 05/11/23 0257  AST 33 20  ALT 19 18  ALKPHOS 64 63  BILITOT 1.0 0.6  PROT 7.8 6.9  ALBUMIN 4.4  4.1   No results for input(s): "LIPASE", "AMYLASE" in the last 168 hours. No results for input(s): "AMMONIA" in the last 168 hours.  ABG    Component Value Date/Time   HCO3 31.1 (H) 01/24/2020 0906   TCO2 26 03/14/2020 1027   O2SAT 63.1 01/24/2020 0906     Coagulation Profile: Recent Labs  Lab 05/10/23 0922  INR 1.1    Cardiac Enzymes: No results for input(s): "CKTOTAL", "CKMB", "CKMBINDEX", "TROPONINI" in the last 168 hours.  HbA1C: Hgb A1c MFr Bld  Date/Time Value Ref Range Status  01/24/2022 07:23 PM 5.7 (H) 4.8 - 5.6 % Final    Comment:     (NOTE) Pre diabetes:          5.7%-6.4%  Diabetes:              >6.4%  Glycemic control for   <7.0% adults with diabetes   12/09/2015 01:16 PM 4.9 4.0 - 6.0 % Final    CBG: No results for input(s): "GLUCAP" in the last 168 hours.  Review of Systems:   A 10 point review of systems was performed and it is as noted above otherwise negative.  Past Medical History:  He,  has a past medical history of Abdominal pain (12/07/2022), Acute urinary retention (12/07/2022), COPD (chronic obstructive pulmonary disease) (HCC), GERD (gastroesophageal reflux disease), Hyperlipidemia, Hypertension, and Pulmonary fibrosis (HCC) (11/2015).   Surgical History:   Past Surgical History:  Procedure Laterality Date   COLONOSCOPY     CORONARY STENT PLACEMENT     ESOPHAGOGASTRODUODENOSCOPY (EGD) WITH PROPOFOL N/A 09/23/2016   Procedure: ESOPHAGOGASTRODUODENOSCOPY (EGD) WITH PROPOFOL;  Surgeon: Wyline Mood, MD;  Location: [facility] ENDOSCOPY;  Service: Endoscopy;  Laterality: N/A;   KYPHOPLASTY N/A 03/14/2020   Procedure: T7 & T11 KYPHOPLASTY;  Surgeon: Kennedy Bucker, MD;  Location: [facility] ORS;  Service: Orthopedics;  Laterality: N/A;   SHOULDER ACROMIOPLASTY       Social History:   reports that he quit smoking about 7 years ago. His smoking use included cigarettes. He started smoking about 57 years ago. He has a 100 pack-year smoking history. He has quit using smokeless tobacco. He reports current alcohol use of about 56.0 standard drinks of alcohol per week. He reports that he does not use drugs.   Family History:  His family history includes Heart disease in his mother.   Allergies Allergies  Allergen Reactions   Amoxicillin Anaphylaxis   Tizanidine     Feet and ankle swell      Home Medications  Prior to Admission medications   Medication Sig Start Date End Date Taking? Authorizing Provider  acetaminophen (TYLENOL) 500 MG tablet Take 1,000 mg by mouth every 6 (six) hours as needed for moderate pain  or headache.    [provider]  [facility] (VENTOLIN HFA) 108 (90 Base) MCG/ACT inhaler Inhale 2 puffs into the lungs every 6 (six) hours as needed for wheezing or shortness of breath. 01/29/20   Danford, Earl Lites, MD  amLODipine (NORVASC) 10 MG tablet Take 1 tablet (10 mg total) by mouth daily. 08/19/16   Virl Axe, MD  aspirin EC 81 MG tablet Take 1 tablet (81 mg total) by mouth daily. 08/19/16   Virl Axe, MD  atorvastatin (LIPITOR) 40 MG tablet Take 1 tablet (40 mg total) by mouth at bedtime. 08/19/16   Virl Axe, MD  CALCIUM 600/VITAMIN D 600-10 MG-MCG TABS Take 1 tablet by mouth 2 (two) times daily. 11/20/21   [provider]  chlorpheniramine-HYDROcodone (TUSSIONEX) 10-8 MG/5ML Take 5 mLs by mouth at bedtime as needed for cough. 04/05/23   Sunnie Nielsen, DO  clopidogrel (PLAVIX) 75 MG tablet Take 1 tablet (75 mg total) by mouth daily. 08/19/16   Virl Axe, MD  Dupilumab ([facility]) 300 MG/2ML SOPN Inject 300 mg into the skin every 14 (fourteen) days. 11/04/22   Salena Saner, MD  Ensure Max Protein (ENSURE MAX PROTEIN) LIQD Take 330 mLs (11 oz total) by mouth 2 (two) times daily. Patient not taking: Reported on 05/10/2023 12/09/22   Esaw Grandchild A, DO  EPINEPHrine 0.3 mg/0.3 mL IJ SOAJ injection Inject 0.3 mg into the muscle as needed for anaphylaxis.    [provider]  fluticasone-salmeterol ([facility]) 250-50 MCG/ACT AEPB Inhale 1 puff into the lungs in the morning and at bedtime. 03/04/22   Salena Saner, MD  gabapentin (NEURONTIN) 300 MG capsule Take 1 capsule (300 mg total) by mouth 3 (three) times daily. 04/27/23   Salena Saner, MD  ipratropium-[facility] ([facility]) 0.5-2.5 (3) MG/3ML SOLN INHALE 1 VIAL THROUGH NEBULIZER EVERY 6 HOURS 10/01/22   Salena Saner, MD  KLOR-CON M20 20 MEQ tablet Take 20 mEq by mouth daily. 11/20/21   [provider]  losartan-hydrochlorothiazide (HYZAAR) 100-25 MG tablet Take 1 tablet  by mouth daily. 11/19/21   [provider]  magnesium oxide (MAG-OX) 400 (241.3 Mg) MG tablet Take 1 tablet (400 mg total) by mouth daily. 06/18/17   Milagros Loll, MD  metoprolol succinate (TOPROL-XL) 50 MG 24 hr tablet Take 1 tablet (50 mg total) by mouth daily. 08/19/16   Virl Axe, MD  montelukast (SINGULAIR) 10 MG tablet Take 10 mg by mouth daily. 11/19/21   [provider]  Multiple Vitamin (MULTIVITAMIN WITH MINERALS) TABS tablet Take 1 tablet by mouth daily. 09/24/16   Enedina Finner, MD  pantoprazole (PROTONIX) 40 MG tablet Take 1 tablet (40 mg total) by mouth 2 (two) times daily. 09/23/16   Enedina Finner, MD  predniSONE (DELTASONE) 10 MG tablet Take 1 tablet (10 mg total) by mouth daily as needed. Take as directed. 04/27/23   Salena Saner, MD  senna-docusate (SENOKOT-S) 8.6-50 MG tablet Take 1 tablet by mouth 2 (two) times daily. 12/09/22   Pennie Banter, DO  Spacer/Aero-Holding Chambers (AEROCHAMBER MV) inhaler Use as instructed 10/05/19   Salena Saner, MD  tamsulosin (FLOMAX) 0.4 MG CAPS capsule Take 1 capsule (0.4 mg total) by mouth daily after supper. 12/09/22   Pennie Banter, DO    Scheduled Meds:  amLODipine  10 mg Oral Daily   arformoterol  15 mcg Nebulization BID   atorvastatin  40 mg Oral QHS   budesonide (PULMICORT) nebulizer solution  0.25 mg Nebulization BID   enoxaparin (LOVENOX) injection  0.5 mg/kg Subcutaneous Q24H   gabapentin  300 mg Oral TID   hydrochlorothiazide  25 mg Oral Daily   losartan  100 mg Oral Daily   metoprolol succinate  50 mg Oral Daily   pantoprazole  40 mg Oral BID   revefenacin  175 mcg Nebulization Daily   tamsulosin  0.4 mg Oral QPC supper   Continuous Infusions: PRN Meds:.Austin [facility], ondansetron **OR** ondansetron (ZOFRAN) IV, oxyCODONE, sodium chloride   Level 2 visit   Phlebotomy performed by MD for Circulogene assay.  Will notify patient of results.  Our standpoint patient may be discharged to home.  He has  follow-up with me on 6 August at 10:30 AM.  Gailen Shelter, MD Advanced Bronchoscopy PCCM [facility]    *This note was dictated using voice recognition software/Dragon.  Despite best efforts to proofread, errors can occur which can change the meaning. Any transcriptional errors that result from this process are unintentional and may not be fully corrected at the time of dictation.

## 2023-05-13 NOTE — Plan of Care (Signed)
  Problem: Education: Goal: Knowledge of General Education information will improve Description: Including pain rating scale, medication(s)/side effects and non-pharmacologic comfort measures Outcome: Progressing   Problem: Clinical Measurements: Goal: Will remain free from infection Outcome: Progressing   

## 2023-05-13 NOTE — Plan of Care (Signed)

## 2023-05-14 ENCOUNTER — Encounter: Payer: Self-pay | Admitting: Cardiology

## 2023-05-14 DIAGNOSIS — R0781 Pleurodynia: Secondary | ICD-10-CM | POA: Diagnosis not present

## 2023-05-14 NOTE — Progress Notes (Signed)
Discharge instructions reviewed with patient.  He verbalized understanding of instructions. All questions answered.  Pt escorted out via wheelchair by staff member

## 2023-05-14 NOTE — Care Management Important Message (Signed)
Important Message  Patient Details  Name: Austin Patterson MRN: [id number] Date of Birth: 08-09-1956   Medicare Important Message Given:  Yes     Olegario Messier A Moksh Loomer 05/14/2023, 11:17 AM

## 2023-05-14 NOTE — Discharge Summary (Incomplete)
Physician Discharge Summary   Patient: Austin Patterson MRN: [id number] DOB: 25-Apr-1956  Admit date:     05/10/2023  Discharge date: {dischdate:26783}  Discharge Physician: Pennie Banter   PCP: Pcp, No   Recommendations at discharge:  {Tip this will not be part of the note when signed- Example include specific recommendations for outpatient follow-up, pending tests to follow-up on. (Optional):26781}  ***  Discharge Diagnoses: Principal Problem:   Pleuritic pain Active Problems:   CAD (coronary artery disease)   Chest pain   Chronic respiratory failure with hypoxia (HCC)   COPD exacerbation (HCC)   Hyponatremia   Hyperlipidemia   Asthma-COPD overlap syndrome   GERD without esophagitis   Lung nodule seen on imaging study   Heterozygous alpha 1-antitrypsin deficiency (HCC)   Pneumothorax after biopsy   Hemothorax on right   Swelling of right foot   Post procedure discomfort  Resolved Problems:   * No resolved hospital problems. *  Hospital Course: No notes on file  Assessment and Plan: Chest pain Positive pleuritic chest pain status post lung biopsy today Postprocedure imaging with noted Trace right pneumothorax anteriorly adjacent to the large right lung mass, Small intermediate-high density right pleural effusion vs. Hemothorax  Patient noted to have been preliminary evaluated by Dr. Jayme Cloud in the ER with recommendation for observation Pain control Continue supplemental O2 in the setting of chronic respiratory failure on 2 L Formal pulmonary consult if any respiratory decline  CAD (coronary artery disease) No active chest pain Holding antiplatelet regimen postprocedure for now  Chronic respiratory failure with hypoxia (HCC) Chronic respiratory failure on 2 L in the setting of alpha-1 antitrypsin deficiency, COPD, noted multi lung mass on imaging Monitoring respiratory status in the setting of pleuritic chest pain with noted trace pneumothorax and hemothorax  on imaging post lung biopsy  Continue supplemental oxygen as needed Formal pulmonology evaluation if patient declines from a respiratory standpoint Follow  Heterozygous alpha 1-antitrypsin deficiency (HCC) Followed by outpatient pulmonology  Lung nodule seen on imaging study Noted 7.7 x 6 cm right middle lobe mass, Continued enlargement of two right lower lobe nodules measuring 2.2 cm and 1.3 cm  Status post lung biopsy today   GERD without esophagitis PPI  Asthma-COPD overlap syndrome Fairly stable from a respiratory standpoint at present Continue home inhalers Follow  Hyperlipidemia Continue statin  Hyponatremia Sodium 129 which appears to be 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 , ,  (Optional):26781}  {(NOTE) Pain control PDMP Statment (Optional):26782} Consultants: *** Procedures performed: ***  Disposition: {Plan; Disposition:26390} Diet recommendation:  Discharge Diet Orders (From admission, onward)     Start     Ordered   05/14/23 0000  Diet - low sodium heart healthy        05/14/23 1112           {Diet_Plan:26776} DISCHARGE MEDICATION: Allergies as of 05/14/2023       Reactions   Amoxicillin Anaphylaxis   Tizanidine    Feet and ankle swell         Medication List     TAKE these medications    acetaminophen 500 MG tablet Commonly known as: TYLENOL Take 1,000 mg by mouth every 6 (six) hours as needed for moderate pain or headache.   AeroChamber MV inhaler Use as instructed   albuterol 108 (90 Base) MCG/ACT inhaler Commonly  known as: VENTOLIN HFA Inhale 2 puffs into the lungs every 6 (six) hours as needed for wheezing or shortness of breath.   amLODipine 10 MG tablet Commonly known as: NORVASC Take 1 tablet (10 mg total) by mouth daily.   aspirin EC 81 MG tablet Take 1 tablet (81 mg total) by mouth daily.   atorvastatin 40 MG  tablet Commonly known as: LIPITOR Take 1 tablet (40 mg total) by mouth at bedtime.   Calcium 600/Vitamin D 600-10 MG-MCG Tabs Generic drug: Calcium Carb-Cholecalciferol Take 1 tablet by mouth 2 (two) times daily.   chlorpheniramine-HYDROcodone 10-8 MG/5ML Commonly known as: TUSSIONEX Take 5 mLs by mouth at bedtime as needed for cough.   clopidogrel 75 MG tablet Commonly known as: PLAVIX Take 1 tablet (75 mg total) by mouth daily.   Dupixent 300 MG/2ML Sopn Generic drug: Dupilumab Inject 300 mg into the skin every 14 (fourteen) days.   Ensure Max Protein Liqd Take 330 mLs (11 oz total) by mouth 2 (two) times daily.   EPINEPHrine 0.3 mg/0.3 mL Soaj injection Commonly known as: EPI-PEN Inject 0.3 mg into the muscle as needed for anaphylaxis.   fluticasone-salmeterol 250-50 MCG/ACT Aepb Commonly known as: Advair Diskus Inhale 1 puff into the lungs in the morning and at bedtime.   gabapentin 300 MG capsule Commonly known as: NEURONTIN Take 1 capsule (300 mg total) by mouth 3 (three) times daily.   ipratropium-albuterol 0.5-2.5 (3) MG/3ML Soln Commonly known as: DUONEB INHALE 1 VIAL THROUGH NEBULIZER EVERY 6 HOURS   Klor-Con M20 20 MEQ tablet Generic drug: potassium chloride SA Take 20 mEq by mouth daily.   losartan-hydrochlorothiazide 100-25 MG tablet Commonly known as: HYZAAR Take 1 tablet by mouth daily.   magnesium oxide 400 (241.3 Mg) MG tablet Commonly known as: MAG-OX Take 1 tablet (400 mg total) by mouth daily.   metoprolol succinate 50 MG 24 hr tablet Commonly known as: TOPROL-XL Take 1 tablet (50 mg total) by mouth daily.   montelukast 10 MG tablet Commonly known as: SINGULAIR Take 10 mg by mouth daily.   multivitamin with minerals Tabs tablet Take 1 tablet by mouth daily.   pantoprazole 40 MG tablet Commonly known as: PROTONIX Take 1 tablet (40 mg total) by mouth 2 (two) times daily.   predniSONE 10 MG tablet Commonly known as: DELTASONE Take 1  tablet (10 mg total) by mouth daily as needed. Take as directed.   senna-docusate 8.6-50 MG tablet Commonly known as: Senokot-S Take 1 tablet by mouth 2 (two) times daily.   tamsulosin 0.4 MG Caps capsule Commonly known as: FLOMAX Take 1 capsule (0.4 mg total) by mouth daily after supper.        Discharge Exam: Filed Weights   05/10/23 1757  Weight: 88.5 kg   ***  Condition at discharge: {DC Condition:26389}  The results of significant diagnostics from this hospitalization (including imaging, microbiology, ancillary and laboratory) are listed below for reference.   Imaging Studies: CT HEAD WO CONTRAST ( )  Result Date: 05/12/2023 CLINICAL DATA:  Initial evaluation for mental status change, unknown cause. EXAM: CT HEAD WITHOUT CONTRAST TECHNIQUE: Contiguous axial images were obtained from the base of the skull through the vertex without intravenous contrast. RADIATION DOSE REDUCTION: This exam was performed according to the departmental dose-optimization program which includes automated exposure control, adjustment of the mA and/or kV according to patient size and/or use of iterative reconstruction technique. COMPARISON:  Comparison made with prior MRI from 04/20/2023. FINDINGS: Brain: Cerebral volume within normal  limits. 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 No acute intracranial hemorrhage. No acute large vessel territory infarct. No mass lesion or midline shift. No hydrocephalus or extra-axial fluid collection. Vascular: No abnormal hyperdense vessel. Scattered vascular calcifications noted within the carotid siphons. Skull: Scalp soft tissues demonstrate no acute finding. Calvarium intact. Sinuses/Orbits: Globes and orbital soft tissues within normal limits. Scattered mucosal thickening present about the ethmoidal air cells and maxillary sinuses. No significant mastoid effusion. Other: None. IMPRESSION: 1. No acute  intracranial abnormality. 2. 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 Electronically Signed   By: Rise Mu M.D.   On: 05/12/2023 01:46   DG Chest Port 1 View  Result Date: 05/11/2023 CLINICAL DATA:  Pneumothorax EXAM: PORTABLE CHEST 1 VIEW COMPARISON:  Chest x-ray 05/11/2023.  Chest x-ray 05/11/2023. FINDINGS: The heart size and mediastinal contours are within normal limits. Right middle lobe masslike opacity is unchanged. Large bullae noted in the lateral right lower hemithorax is unchanged. No definite pneumothorax or pleural effusion identified. No new focal lung infiltrate. No acute fractures. IMPRESSION: 1. No definite pneumothorax identified. 2. Right middle lobe masslike opacities unchanged. Electronically Signed   By: Darliss Cheney M.D.   On: 05/11/2023 23:43   CT CHEST WO CONTRAST  Result Date: 05/11/2023 CLINICAL DATA:  Follow-up pneumothorax EXAM: CT CHEST WITHOUT CONTRAST TECHNIQUE: Multidetector CT imaging of the chest was performed following the standard protocol without IV contrast. RADIATION DOSE REDUCTION: This exam was performed according to the departmental dose-optimization program which includes automated exposure control, adjustment of the mA and/or kV according to patient size and/or use of iterative reconstruction technique. COMPARISON:  CT 05/10/2023, chest x-ray 05/11/2023 FINDINGS: Cardiovascular: Limited evaluation without intravenous contrast. Advanced aortic atherosclerosis. No aneurysm. Coronary vascular disease. Normal cardiac size. No pericardial effusion Mediastinum/Nodes: Midline trachea. No thyroid mass. Enlarged right hilar nodes better seen on prior contrast enhanced examination. Enlarged right precarinal lymph node measuring 2.1 cm, previously 1.8 cm, series 2, image 51. Esophagus within normal limits. Lungs/Pleura: Emphysema. Large right middle lobe lung mass measuring approximately 7.7 by 6.5 cm on series  4, image 98, stable in the short interval follow-up, prior maximum measurement of 7.7 cm. Very tiny pneumothorax in the right middle lobe anterior to the lung mass, series 4, image 79, not significantly increased in size. Small volume right pleural effusion slightly increased, now measuring closer to water density. Right lower lobe posterior pulmonary nodules are more obscured by pleural fluid and atelectasis. Stable 7 mm medial left lung base pulmonary nodule series 4, image 127. Upper Abdomen: Bilateral adrenal nodules, measuring 2.5 cm on the right with density value of -6.7. Left adrenal nodule with density value of 0.7, this measures 2 cm. Findings would be consistent with benign adenoma. Hyperdensity in the gallbladder could reflect vicarious excretion of contrast. No calcified stones seen on the recent prior CT. Enlarged porta hepatis node measuring 19 mm, previously 17 mm. Musculoskeletal: Post augmentation changes T7 and T11. chronic bilateral scapular fractures IMPRESSION: 1. Very tiny right anterior pneumothorax, not significantly increased in size. 2. Small volume right pleural effusion slightly increased in size, but now measuring closer to water density. Short-term stability of right middle lobe large lung mass measuring up to 7.7 cm. 3. Right lower lobe suspected metastatic pulmonary nodules are more obscured by pleural fluid and atelectasis on this exam. Stable 7 mm medial left lung base pulmonary nodule. 4. Enlarged mediastinal and right hilar nodes as  seen on prior contrast enhanced exam, concerning for metastatic disease. 5. Bilateral adrenal nodules consistent with benign adenomas. Aortic Atherosclerosis (ICD10-I70.0) and Emphysema (ICD10-J43.9). Electronically Signed   By: Jasmine Pang M.D.   On: 05/11/2023 17:44   [place] RT LOWER EXTREM LTD SOFT TISSUE NON VASCULAR  Result Date: 05/11/2023 CLINICAL DATA:  Foot swelling.  Trauma. EXAM: ULTRASOUND right LOWER EXTREMITY LIMITED TECHNIQUE:  Ultrasound examination of the lower extremity soft tissues was performed in the area of clinical concern. COMPARISON:  Foot x-ray 05/11/2023 FINDINGS: Specific ultrasound in the area of concern about the dorsal aspect of the foot demonstrates oblong heterogeneous area which is hypoechoic measuring 3.5 x 1.0 by 4.2 cm. No specific blood flow on Doppler. Margins are somewhat ill-defined. This corresponds to the area of thickening on x-ray. IMPRESSION: 4.2 cm lobular hypoechoic area along the dorsal aspect of the right foot. Possibilities with the patient's history would include hematoma but there is a differential by ultrasound. Recommend either follow-up to confirm resolution and exclude underlying lesion versus additional workup with MRI as clinically appropriate. Electronically Signed   By: Karen Kays M.D.   On: 05/11/2023 16:12   DG Foot Complete Right  Result Date: 05/11/2023 CLINICAL DATA:  Foot swelling EXAM: RIGHT FOOT COMPLETE - 3+ VIEW COMPARISON:  None Available. FINDINGS: No acute displaced fracture or malalignment. Possible remote fracture involving the fifth proximal phalanx. Mild degenerative change at the first MTP joint. Prominent dorsal soft tissue swelling IMPRESSION: Prominent dorsal soft tissue swelling. No acute osseous abnormality. Electronically Signed   By: Jasmine Pang M.D.   On: 05/11/2023 15:47   DG Chest 2 View  Result Date: 05/11/2023 CLINICAL DATA:  Follow-up pneumothorax EXAM: CHEST - 2 VIEW COMPARISON:  CT 05/10/2023 FINDINGS: Emphysema. Large bulla in the right lower lung. Small right-sided pleural effusion. Atelectasis or scarring in the left lower lung. Large right middle lobe lung mass. No visible pneumothorax. Right scapular fracture with callus. IMPRESSION: 1. No visible pneumothorax. 2. Large right middle lobe lung mass. 3. Small right pleural effusion. 4. Emphysema. Electronically Signed   By: Jasmine Pang M.D.   On: 05/11/2023 15:43   CT CHEST ABDOMEN PELVIS W  CONTRAST  Result Date: 05/10/2023 CLINICAL DATA:  Chest pain and abdominal pain and shortness of breath following lung biopsy EXAM: CT CHEST, ABDOMEN, AND PELVIS WITH CONTRAST TECHNIQUE: Multidetector CT imaging of the chest, abdomen and pelvis was performed following the standard protocol during bolus administration of intravenous contrast. RADIATION DOSE REDUCTION: This exam was performed according to the departmental dose-optimization program which includes automated exposure control, adjustment of the mA and/or kV according to patient size and/or use of iterative reconstruction technique. CONTRAST:  OMNIPAQUE IOHEXOL 300 MG/ML  SOLN COMPARISON:  PET-CT 04/19/2023, CT 04/03/2023 FINDINGS: CT CHEST FINDINGS Cardiovascular: Heart size within normal limits. No pericardial effusion. Thoracic aorta is nonaneurysmal. Extensive atherosclerotic calcifications of the aorta and coronary arteries. Central pulmonary vasculature is within normal limits. Mediastinum/Nodes: Pathologically enlarged mediastinal and right hilar lymph nodes including reference precarinal node measuring 1.8 cm short axis (previously 1.7 cm) and 1.5 cm right hilar node. No axillary or left hilar lymphadenopathy. Lungs/Pleura: Small right pleural effusion with internal density of 40 HU, new since 04/19/2023, which could be related to malignant effusion versus small hemothorax in the setting of recent lung biopsy. Trace right pneumothorax anteriorly adjacent to the large right lung mass (series 4, image 89). Right middle lobe lung mass has slightly increased in size since 04/19/2023, now  measuring 7.7 x 6.0 cm transaxially (previously 7.1 x 5.5 cm). Continued enlargement of 2 right lower lobe nodules measuring 2.2 cm and 1.3 cm (previously 1.8 cm and 1.0 cm). New 0.7 cm left lower lobe nodule (series 4, image 128). Background of advanced emphysema. Large bulla in the right middle lobe. Musculoskeletal: Prior cement augmentation of the T6 and  T10 vertebral bodies. Chronic bilateral scapular fractures. No discrete lytic or sclerotic bone lesion. No chest wall hematoma. CT ABDOMEN PELVIS FINDINGS Hepatobiliary: Subcentimeter low-density lesion within the right hepatic lobe is too small to characterize (series 2, image 54). Liver appears otherwise unremarkable. Unremarkable gallbladder. No hyperdense gallstone. No biliary dilatation. Pancreas: Unremarkable. No pancreatic ductal dilatation or surrounding inflammatory changes. Spleen: Normal in size without focal abnormality. Adrenals/Urinary Tract: Stable benign adrenal adenomas which do not require follow-up imaging. Areas of cortical scarring of both kidneys. No renal stone or hydronephrosis. Urinary bladder within normal limits. Stomach/Bowel: Stomach is within normal limits. Scattered colonic diverticulosis. No evidence of bowel wall thickening, distention, or inflammatory changes. Vascular/Lymphatic: Aortic atherosclerosis. Enlarged lymph node in the porta hepatis measuring 1.6 cm. No enlarged pelvic lymph nodes. Reproductive: Prostate is unremarkable. Other: No free fluid. No abdominopelvic fluid collection. No pneumoperitoneum. Musculoskeletal: No acute or significant osseous findings. IMPRESSION: 1. Trace right pneumothorax anteriorly adjacent to the large right lung mass. 2. Small intermediate-high density right pleural effusion, new since 04/19/2023, which could be related to malignant effusion versus small hemothorax in the setting of recent lung biopsy. 3. Right middle lobe lung mass has slightly increased in size since 04/19/2023, now measuring 7.7 x 6.0 cm transaxially (previously 7.1 x 5.5 cm). 4. Continued enlargement of two right lower lobe nodules measuring 2.2 cm and 1.3 cm (previously 1.8 cm and 1.0 cm). New 0.7 cm left lower lobe nodule. Findings are compatible with progression of metastatic disease. 5. Pathologically enlarged mediastinal, right hilar, and upper abdominal lymph nodes,  also compatible with metastatic disease. Aortic Atherosclerosis (ICD10-I70.0) and Emphysema (ICD10-J43.9). Electronically Signed   By: Duanne Guess D.O.   On: 05/10/2023 15:11   DG Chest 2 View  Result Date: 05/10/2023 CLINICAL DATA:  Shortness of breath following biopsy EXAM: CHEST - 2 VIEW COMPARISON:  05/10/2023 FINDINGS: Stable aeration and chronic emphysema change with right lung bullous disease and basilar scarring. Large anterior right middle lobe mass again noted, biopsied earlier today. No enlarging or significant pneumothorax by plain radiography. No developing pleural effusion. No new collapse or consolidation. Trachea midline. Normal heart size and vascularity. Chronic osseous changes and previous vertebral augmentations noted on the lateral view. IMPRESSION: 1. Stable chronic emphysema and right lung bullous disease. 2. Stable large right middle lobe mass 3. No complicating feature by plain radiography. Electronically Signed   By: Judie Petit.  Shick M.D.   On: 05/10/2023 13:29   DG Chest Port 1 View  Result Date: 05/10/2023 CLINICAL DATA:  Large right middle lobe mass status post biopsy EXAM: PORTABLE CHEST 1 VIEW COMPARISON:  05/10/2023, 04/03/2019 FINDINGS: Large right middle lobe mass again noted. No effusion or significant pneumothorax following biopsy by plain radiography. Background emphysema noted. Mid and lower lung parenchymal scarring evident. Stable heart size and vascularity. No edema. No new collapse or consolidation. IMPRESSION: Large right middle lobe mass, recently biopsied. No complicating feature by plain radiography. Electronically Signed   By: Judie Petit.  Shick M.D.   On: 05/10/2023 11:53   CT LUNG MASS BIOPSY  Result Date: 05/10/2023 INDICATION: Large anterior right middle lobe mass EXAM: CT-GUIDED  BIOPSY CORE BIOPSY LARGE RIGHT MIDDLE LOBE MASS MEDICATIONS: 1% LIDOCAINE LOCAL ANESTHESIA/SEDATION: 0 mg IV Versed; 25 mcg IV Fentanyl Moderate Sedation Time:  NONE. The patient was  continuously monitored during the procedure by the interventional radiology nurse under my direct supervision. PROCEDURE: The procedure, risks, benefits, and alternatives were explained to the patient. Questions regarding the procedure were encouraged and answered. The patient understands and consents to the procedure. Previous imaging reviewed. Patient positioned supine. Noncontrast localization CT performed. The anterior large right middle lobe mass abutting the anterior chest wall was localized and marked for biopsy. Under sterile conditions and local anesthesia, the 17 gauge core biopsy needle was advanced from an anterior intercostal approach to the lesion. Needle position confirmed with CT. 2 18 gauge core biopsies obtained. Samples were intact and non fragmented. These were placed in formalin. Needle tract occluded with the bio sentry device. Postprocedure imaging demonstrates no complicating feature. Stable emphysema and adjacent bullous disease. Patient tolerated the procedure well without complication. Vital sign monitoring by nursing staff during the procedure will continue as patient is in the special procedures unit for post procedure observation. FINDINGS: The images document guide needle placement within the large anterior right middle lobe mass. Post biopsy images demonstrate no hemorrhage, hematoma, large effusion, or pneumothorax. COMPLICATIONS: None immediate. IMPRESSION: Successful CT-guided core biopsy of the large anterior right middle lobe mass RADIATION DOSE REDUCTION: This exam was performed according to the departmental dose-optimization program which includes automated exposure control, adjustment of the mA and/or kV according to patient size and/or use of iterative reconstruction technique. Electronically Signed   By: Judie Petit.  Shick M.D.   On: 05/10/2023 11:51   MR Brain W Wo Contrast  Result Date: 04/28/2023 CLINICAL DATA:  67 year old male with recently diagnosed right lung mass and  evidence of lymph node metastases on PET-CT staging. EXAM: MRI HEAD WITHOUT AND WITH CONTRAST TECHNIQUE: Multiplanar, multiecho pulse sequences of the brain and surrounding structures were obtained without and with intravenous contrast. CONTRAST:  10mL GADAVIST GADOBUTROL 1 MMOL/ML IV SOLN COMPARISON:  PET-CT 04/19/2023.  Previous head CT 06/15/2022. FINDINGS: Brain: Mildly motion degraded postcontrast imaging. No abnormal enhancement identified. No dural thickening identified. No midline shift, mass effect, or evidence of intracranial mass lesion. No restricted diffusion to suggest acute infarction. No ventriculomegaly, extra-axial collection or acute intracranial hemorrhage. Cervicomedullary junction and pituitary are within normal limits. Small chronic left cerebellar infarct. No cerebral cortical encephalomalacia identified. No chronic cerebral blood products. But patchy moderate for age bilateral cerebral white matter T2 and FLAIR hyperintensity. Deep gray nuclei and brainstem remain normal. Vascular: Major intracranial vascular flow voids are preserved. Major dural venous structures are enhancing following contrast and appear to be patent. Skull and upper cervical spine: Visualized bone marrow signal is within normal limits. Negative visible cervical spine. Sinuses/Orbits: Negative orbits. Left maxillary sinus mucosal thickening and mucous retention cysts. Mild paranasal sinus mucosal thickening elsewhere. Other: Mastoids are well aerated. Visible internal auditory structures appear normal. Negative visible scalp and face. IMPRESSION: 1. No metastatic disease or acute intracranial abnormality. 2. Small chronic left cerebellar infarct. And moderate for age cerebral white matter signal changes, likely also due to small vessel disease. Electronically Signed   By: Odessa Fleming M.D.   On: 04/28/2023 08:50   NM PET Image Initial (PI) Skull Base To Thigh  Result Date: 04/20/2023 CLINICAL DATA:  Initial treatment  strategy for right lung mass and thoracic adenopathy on chest CT. EXAM: NUCLEAR MEDICINE PET SKULL BASE TO THIGH  TECHNIQUE: 11.5 mCi F-18 FDG was injected intravenously. Full-ring PET imaging was performed from the skull base to thigh after the radiotracer. CT data was obtained and used for attenuation correction and anatomic localization. Fasting blood glucose: 110 mg/dl COMPARISON:  CTA chest 04/03/2023. CT stone study of the abdomen and pelvis 04/03/2023. FINDINGS: Mediastinal blood pool activity: SUV max 2.6 Liver activity: SUV max NA NECK: Low right jugular node measures 8 mm and a S.U.V. max of 3.5 on 34/3. Incidental CT findings: No cervical adenopathy. Bilateral carotid atherosclerosis. Left maxillary sinus mucous retention cysts or polyps. CHEST: The anterior right lung mass (positioned along the right minor fissure and favored to be within the superior right middle lobe) measures 7.1 x 5.5 cm and a S.U.V. max of 17.9 on 63/3. Bilateral mediastinal and hilar hypermetabolic nodes. Example high left mediastinal node of 7 mm and a S.U.V. max of 4.8 on 44/3. Right hilar node measures 1.8 cm on the prior diagnostic CT and a S.U.V. max of 9.6 on 56/3. Right lower lobe hypermetabolism is favored to correspond to the more lateral of the 2 right lower lobe pulmonary nodules on the prior diagnostic CT. 1.8 cm and a S.U.V. max of 3.4 on 77/3. Incidental CT findings: Deferred to recent diagnostic CT. Aortic and coronary artery calcification. Centrilobular emphysema. Large bulla or bleb just posterior to the dominant anterior right lung mass. ABDOMEN/PELVIS: Hypermetabolic porta hepatis nodes, including a 1.4 cm node which measures a S.U.V. max of 7.2 on 90/3. No parenchymal hypermetabolism identified. Incidental CT findings: Bilateral low-density adrenal nodules including at up to 2.6 cm on the right, consistent with adenomas. No follow-up indicated. Abdominal aortic atherosclerosis. Proximal gastric underdistention.  Scattered colonic diverticula. Mild prostatomegaly. SKELETON: Hypermetabolism corresponding to relatively symmetric remote incompletely healed scapular spine fractures. No findings to suggest hypermetabolic osseous metastasis. Incidental CT findings: Bilateral L5 pars defects. Vertebral augmentation at multiple thoracic levels. IMPRESSION: 1. 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 2. Right lower lobe hypermetabolic pulmonary nodule, favoring synchronous primary over pulmonary metastasis. 3. Incidental findings, including: Aortic atherosclerosis (ICD10-I70.0), coronary artery atherosclerosis and emphysema (ICD10-J43.9). Prostatomegaly. Sinus disease. Electronically Signed   By: Jeronimo Greaves M.D.   On: 04/20/2023 13:59    Microbiology: Results for orders placed or performed during the hospital encounter of 04/03/23  Resp Panel by RT-PCR (Flu A&B, Covid) Anterior Nasal Swab     Status: Abnormal   Collection Time: 04/03/23  3:43 PM   Specimen: Anterior Nasal Swab  Result Value Ref Range Status   SARS Coronavirus 2 by RT PCR POSITIVE (A) NEGATIVE Final    Comment: (NOTE) SARS-CoV-2 target nucleic acids are DETECTED.  The SARS-CoV-2 RNA is generally detectable in upper respiratory specimens during the acute phase of infection. Positive results are indicative of the presence of the identified virus, but do not rule out bacterial infection or co-infection with other pathogens not detected by the test. Clinical correlation with patient history and other diagnostic information is necessary to determine patient infection status. The expected result is Negative.  Fact Sheet for Patients: BloggerCourse.com  Fact Sheet for Healthcare Providers: SeriousBroker.it  This test is not yet approved or cleared by the [place] FDA and  has  been authorized for detection and/or diagnosis of SARS-CoV-2 by FDA under an Emergency Use Authorization (EUA).  This EUA will remain in effect (meaning this test can be used) for the duration  of  the COVID-19 declaration under Section 564(b)(1) of the A ct, 21 U.S.C. section 360bbb-3(b)(1), unless the authorization is terminated or revoked sooner.     Influenza A by PCR NEGATIVE NEGATIVE Final   Influenza B by PCR NEGATIVE NEGATIVE Final    Comment: (NOTE) The Xpert Xpress SARS-CoV-2/FLU/RSV plus assay is intended as an aid in the diagnosis of influenza from Nasopharyngeal swab specimens and should not be used as a sole basis for treatment. Nasal washings and aspirates are unacceptable for Xpert Xpress SARS-CoV-2/FLU/RSV testing.  Fact Sheet for Patients: BloggerCourse.com  Fact Sheet for Healthcare Providers: SeriousBroker.it  This test is not yet approved or cleared by the [place] FDA and has been authorized for detection and/or diagnosis of SARS-CoV-2 by FDA under an Emergency Use Authorization (EUA). This EUA will remain in effect (meaning this test can be used) for the duration of the COVID-19 declaration under Section 564(b)(1) of the Act, 21 U.S.C. section 360bbb-3(b)(1), unless the authorization is terminated or revoked.  Performed at [facility], [address] [place], [place] [address]   Respiratory (~20 pathogens) panel by PCR     Status: None   Collection Time: 04/03/23  4:30 PM  Result Value Ref Range Status   Adenovirus NOT DETECTED NOT DETECTED Final   Coronavirus 229E NOT DETECTED NOT DETECTED Final    Comment: (NOTE) The Coronavirus on the Respiratory Panel, DOES NOT test for the novel  Coronavirus (2019 nCoV)    Coronavirus HKU1 NOT DETECTED NOT DETECTED Final   Coronavirus NL63 NOT DETECTED NOT DETECTED Final   Coronavirus OC43 NOT DETECTED NOT DETECTED Final   Metapneumovirus NOT  DETECTED NOT DETECTED Final   Rhinovirus / Enterovirus NOT DETECTED NOT DETECTED Final   Influenza A NOT DETECTED NOT DETECTED Final   Influenza B NOT DETECTED NOT DETECTED Final   Parainfluenza Virus 1 NOT DETECTED NOT DETECTED Final   Parainfluenza Virus 2 NOT DETECTED NOT DETECTED Final   Parainfluenza Virus 3 NOT DETECTED NOT DETECTED Final   Parainfluenza Virus 4 NOT DETECTED NOT DETECTED Final   Respiratory Syncytial Virus NOT DETECTED NOT DETECTED Final   Bordetella pertussis NOT DETECTED NOT DETECTED Final   Bordetella Parapertussis NOT DETECTED NOT DETECTED Final   Chlamydophila pneumoniae NOT DETECTED NOT DETECTED Final   Mycoplasma pneumoniae NOT DETECTED NOT DETECTED Final    Comment: Performed at [facility], [address], [place], [place] [address]    Labs: CBC: Recent Labs  Lab 05/10/23 [phone] 05/10/23 1244 05/11/23 0257 05/13/23 0514  WBC 8.7 9.8 9.6 9.6  NEUTROABS  --  7.4  --   --   HGB 10.1* 10.2* 9.1* 9.1*  HCT 31.6* 32.1* 28.0* 28.0*  MCV 85.6 86.1 86.2 85.6  PLT 300 313 297 355   Basic Metabolic Panel: Recent Labs  Lab 05/10/23 1244 05/11/23 0257 05/13/23 0514  NA 129* 130* 132*  K 4.7 4.1 3.9  CL 92* 92* 90*  CO2 27 29 31   GLUCOSE 135* 115* 112*  BUN 12 18 19   CREATININE 0.70 0.92 0.68  CALCIUM 9.2 8.8* 9.1   Liver Function Tests: Recent Labs  Lab 05/10/23 1244 05/11/23 0257  AST 33 20  ALT 19 18  ALKPHOS 64 63  BILITOT 1.0 0.6  PROT 7.8 6.9  ALBUMIN 4.4 4.1   CBG: No results for input(s): "GLUCAP" in the last 168 hours.  Discharge time spent: {LESS THAN/GREATER XBJY:[id number]} 30 minutes.  Signed: Pennie Banter, DO [facility]  05/14/2023 

## 2023-05-14 NOTE — Plan of Care (Signed)

## 2023-05-14 NOTE — Plan of Care (Signed)

## 2023-05-15 ENCOUNTER — Encounter: Payer: Self-pay | Admitting: Internal Medicine

## 2023-05-17 ENCOUNTER — Inpatient Hospital Stay: Payer: Medicare HMO | Attending: Internal Medicine | Admitting: Internal Medicine

## 2023-05-17 ENCOUNTER — Encounter: Payer: Self-pay | Admitting: *Deleted

## 2023-05-17 VITALS — BP 113/62 | HR 92 | Temp 98.4°F | Wt 203.1 lb

## 2023-05-17 DIAGNOSIS — K219 Gastro-esophageal reflux disease without esophagitis: Secondary | ICD-10-CM | POA: Diagnosis not present

## 2023-05-17 DIAGNOSIS — E785 Hyperlipidemia, unspecified: Secondary | ICD-10-CM | POA: Insufficient documentation

## 2023-05-17 DIAGNOSIS — I10 Essential (primary) hypertension: Secondary | ICD-10-CM | POA: Insufficient documentation

## 2023-05-17 DIAGNOSIS — D649 Anemia, unspecified: Secondary | ICD-10-CM

## 2023-05-17 DIAGNOSIS — J84112 Idiopathic pulmonary fibrosis: Secondary | ICD-10-CM | POA: Insufficient documentation

## 2023-05-17 DIAGNOSIS — J441 Chronic obstructive pulmonary disease with (acute) exacerbation: Secondary | ICD-10-CM | POA: Diagnosis not present

## 2023-05-17 DIAGNOSIS — C3490 Malignant neoplasm of unspecified part of unspecified bronchus or lung: Secondary | ICD-10-CM

## 2023-05-17 DIAGNOSIS — C342 Malignant neoplasm of middle lobe, bronchus or lung: Secondary | ICD-10-CM | POA: Insufficient documentation

## 2023-05-17 DIAGNOSIS — Z7951 Long term (current) use of inhaled steroids: Secondary | ICD-10-CM | POA: Insufficient documentation

## 2023-05-17 DIAGNOSIS — N400 Enlarged prostate without lower urinary tract symptoms: Secondary | ICD-10-CM | POA: Insufficient documentation

## 2023-05-17 DIAGNOSIS — Z7982 Long term (current) use of aspirin: Secondary | ICD-10-CM | POA: Insufficient documentation

## 2023-05-17 DIAGNOSIS — R918 Other nonspecific abnormal finding of lung field: Secondary | ICD-10-CM

## 2023-05-17 DIAGNOSIS — C3491 Malignant neoplasm of unspecified part of right bronchus or lung: Secondary | ICD-10-CM

## 2023-05-17 DIAGNOSIS — Z79899 Other long term (current) drug therapy: Secondary | ICD-10-CM | POA: Insufficient documentation

## 2023-05-17 DIAGNOSIS — Z955 Presence of coronary angioplasty implant and graft: Secondary | ICD-10-CM | POA: Diagnosis not present

## 2023-05-17 DIAGNOSIS — Z7902 Long term (current) use of antithrombotics/antiplatelets: Secondary | ICD-10-CM | POA: Insufficient documentation

## 2023-05-17 DIAGNOSIS — Z87891 Personal history of nicotine dependence: Secondary | ICD-10-CM | POA: Diagnosis not present

## 2023-05-17 DIAGNOSIS — J9610 Chronic respiratory failure, unspecified whether with hypoxia or hypercapnia: Secondary | ICD-10-CM | POA: Diagnosis not present

## 2023-05-17 DIAGNOSIS — I2510 Atherosclerotic heart disease of native coronary artery without angina pectoris: Secondary | ICD-10-CM | POA: Insufficient documentation

## 2023-05-17 NOTE — Progress Notes (Addendum)
Met with patient during follow up visit with Dr. Alena Bills. All questions answered during visit. Reviewed upcoming appts with patient. Informed that will call with his appt for port placement as well as when treatment will start. Pt verbalized understanding. Nothing further needed at this time.  Order for [employer] placed (xT, xR, PD-L1 [phone])

## 2023-05-17 NOTE — Progress Notes (Unsigned)
[facility] CONSULT NOTE  Patient Care Team: Pcp, No as PCP - General Debbe Odea, MD as PCP - Cardiology (Cardiology) Salena Saner, MD as Consulting Physician (Pulmonary Disease) Glory Buff, RN as Oncology Nurse Navigator Michaelyn Barter, MD as Consulting Physician (Oncology)   CANCER STAGING   Cancer Staging  Sarcomatoid carcinoma of lung [facility]) Staging form: Lung, AJCC 8th Edition - Clinical: Stage IV (cT4, cN3, cM1) - Signed by Michaelyn Barter, MD on 05/20/2023 Stage prefix: Initial diagnosis   ASSESSMENT & PLAN:  Austin Patterson 67 y.o. 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  Pathology showed poorly differentiated carcinoma with spindle cell component.  Tumor cells positive for CK and focal TTF-1.  The differential diagnosis includes pleomorphic carcinoma and carcinosarcoma.  Definitive classification cannot be performed on the biopsy specimen.  - PET CT scan from 04/19/2023 showed 7.1 cm right lung mass with SUV of 17.9, bilateral mediastinal and hilar hypermetabolic nodes, hypermetabolic porta hepatis node 1.4 cm SUV 7.2, right lower lobe hypermetabolic pulmonary nodule favoring synchronous primary. Postbiopsy had COPD exacerbation, patient had to be hospitalized.  Repeat CT from 7/22 did show new 0.5 cm left lower lobe nodule.  Also increase in the size of dominant mass from  7.1 x5.5 to 7.7 x 6 cm.  Also increase in the size of 2 RLL nodules.  MRI brain with and without contrast was negative for metastatic disease.  -I discussed with the patient in detail about the diagnosis, prognosis and staging.  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  Goal of treatment is palliative.  Patient has poor functional status with ECOG of 3.  He lives alone.  His landlord helps with the transportation. Will send for [employer] NGS testing to look for any targetable mutation for nonchemo based option.  At this stage, patient wants to proceed with anything.  I did discuss that due to his other comorbidities he is at high risk for complication from any chemotherapy.  As a result would consider dose reduced CarboTaxol with Keytruda carboplatin AUC of 4, taxol 125 mg/m and Keytruda 200 mg.  He will need close monitoring.  I did discuss side effects in detail with him such as increased risk of infection, decreased blood count, anemia, need for blood transfusions, nausea, vomiting, fatigue, decreased appetite, hair loss.  I will hold off on Alimta because there is increased risk of hematologic toxicity and the patient is on dual antiplatelet for CAD would be concerned about thrombocytopenia.  I have also discussed with Dr. Jayme Cloud, pulmonary considering he has history of multiple lung problems but there was no concern for any autoimmune process so we will consider Keytruda also.  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  Port placement.  I also called his son to update him about the prognosis.  He plans to come down and follow-up with him with next appointment.  Overall, his prognosis is very poor.  Will  plan to repeat CT imaging after 2-3 cycles to assess for response.  -Referral to palliative care for goals of care.  # Chronic respiratory failure on 2 L oxygen # Asthma and COPD overlap # Idiopathic pulmonary fibrosis -Management per pulmonary.  On  Dupixent  # Hyponatremia -Chronic in nature.  Sodium 129 at baseline.  # Normocytic anemia -Baseline hemoglobin between 9-10 since February 2024. -I will add iron panel, B12 and folate with next lab.  Orders Placed This Encounter  Procedures   CBC with Differential (Cancer Center Only)    Standing Status:   Future    Standing Expiration Date:   05/31/2024   CMP (Cancer Center only)    Standing Status:   Future    Standing Expiration Date:   05/31/2024   T4    Standing Status:   Future    Standing Expiration Date:   05/31/2024   TSH    Standing Status:   Future    Standing Expiration Date:   05/31/2024   Iron and TIBC    Standing Status:   Future    Standing Expiration Date:   05/19/2024   Ferritin    Standing Status:   Future    Standing Expiration Date:   05/19/2024   Vitamin B12    Standing Status:   Future    Standing Expiration Date:   05/19/2024   Folate    Standing Status:   Future    Standing Expiration Date:   05/19/2024   Ambulatory Referral to Palliative Care    Referral Priority:   Routine    Referral Type:   Consultation    Referred to Provider:   Malachy Moan, NP    Number of Visits Requested:   1   RTC in 2 weeks for MD visit, labs, cycle 1 of CarboTaxol Keytruda.  The total time spent in the appointment was 45 minutes encounter with patients including review of chart and various tests results, discussions about plan of care and coordination of care plan   All questions were answered. The patient knows to call the clinic with any problems, questions or concerns. No barriers to learning was detected.  Michaelyn Barter, MD 8/1/20243:21 PM   HISTORY OF PRESENTING ILLNESS:  Austin Patterson 67 y.o. 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  CTA chest showed new 6.8 x 4.9 cm soft tissue mass in the anterior right midlung field.  10 mm nodule in the right lower lung field.  1.8 cm nodule in the right lower lung field.  Large bulla and emphysema present.  Enlarged lymph nodes in the mediastinum measuring 2.1 x 1.9 cm right hilum 2.8 x 1.8 cm.  2.1 cm nodule in right adrenal.  1.8 cm nodule in the left adrenal suggestive of adenomas.  No evidence of PE.    Patient was seen today in the clinic for further evaluation.  He is a remote smoker quit in 2016.  Prior smoked 2 packs a day since age 26.  He is on 2 L oxygen since 2017.  Lives alone.  Was in a wheelchair.  Does not have family close by.  Follows with Dr. Jayme Cloud.   Interval history Patient was seen today as follow-up to discuss the path report. After the lung biopsy, patient had exacerbation of COPD and was admitted.  CT Showed trace right pneumothorax with small right pleural effusion concerning for hemothorax.  Was treated with conservative management.  His breathing is stable.  On 2 L oxygen which is chronic.  I have reviewed his chart and materials related to his cancer extensively and collaborated history with the patient. Summary of oncologic history is as follows: His breathing is stable Oncology History  Sarcomatoid carcinoma of lung (HCC)  05/18/2023 Initial Diagnosis   Sarcomatoid carcinoma of lung (HCC)   05/20/2023 Cancer Staging   Staging form: Lung, AJCC 8th Edition - Clinical: Stage IV (cT4, cN3, cM1) - Signed by Michaelyn Barter, MD on 05/20/2023 Stage prefix: Initial diagnosis   06/01/2023 -  Chemotherapy   Patient is on Treatment Plan : LUNG NSCLC Carboplatin (6) + Paclitaxel (200) + Pembrolizumab (200) D1 q21d x 4 cycles / Pembrolizumab (200) Maintenance D1 q21d       MEDICAL  HISTORY:  Past Medical History:  Diagnosis Date   Abdominal pain 12/07/2022   Acute urinary retention 12/07/2022   Chest pain 01/24/2022   COPD (chronic obstructive pulmonary disease) (HCC)    GERD (gastroesophageal reflux disease)    Hemothorax on right 05/11/2023   Hyperlipidemia    Hypertension    Pleuritic pain 05/10/2023   Pneumothorax after biopsy 05/11/2023   Post procedure discomfort 05/13/2023   Pulmonary fibrosis (HCC) 11/2015    SURGICAL HISTORY: Past Surgical History:  Procedure Laterality Date   COLONOSCOPY     CORONARY STENT PLACEMENT     ESOPHAGOGASTRODUODENOSCOPY (EGD) WITH PROPOFOL N/A 09/23/2016   Procedure: ESOPHAGOGASTRODUODENOSCOPY (EGD) WITH PROPOFOL;  Surgeon: Wyline Mood, MD;  Location: [facility] ENDOSCOPY;  Service: Endoscopy;  Laterality: N/A;   KYPHOPLASTY N/A 03/14/2020   Procedure: T7 & T11 KYPHOPLASTY;  Surgeon: Kennedy Bucker, MD;  Location: [facility] ORS;  Service: Orthopedics;  Laterality: N/A;   SHOULDER ACROMIOPLASTY      SOCIAL HISTORY: Social History   Socioeconomic History   Marital status: Legally Separated    Spouse name: Not on file   Number of children: Not on file   Years of education: Not on file   Highest education level: Not on file  Occupational History   Not on file  Tobacco Use   Smoking status: Former    Current packs/day: 0.00    Average packs/day: 2.0 packs/day for 50.0 years (100.0 ttl pk-yrs)    Types: Cigarettes    Start date: 10/13/1965    Quit date: 10/14/2015    Years since quitting: 7.6   Smokeless tobacco: Former  Building services engineer status: Never Used  Substance and Sexual Activity   Alcohol use: Yes    Alcohol/week: 56.0 standard drinks of alcohol    Types: 56 Cans of beer per week    Comment: "I sit around and drink beer, that's all I got to do"   Drug use: No   Sexual activity: Not Currently  Other Topics Concern   Not on file  Social History Narrative   Not on file   Social Determinants of Health    Financial Resource Strain: Not on file  Food Insecurity: No Food Insecurity (04/03/2023)   Hunger Vital Sign    Worried About Running Out of Food in the Last Year: Never true    Ran Out of Food in the Last  Year: Never true  Transportation Needs: No Transportation Needs (04/03/2023)   PRAPARE - Administrator, Civil Service (Medical): No    Lack of Transportation (Non-Medical): No  Physical Activity: Not on file  Stress: Not on file  Social Connections: Not on file  Intimate Partner Violence: Not At Risk (04/03/2023)   Humiliation, Afraid, Rape, and Kick questionnaire    Fear of Current or Ex-Partner: No    Emotionally Abused: No    Physically Abused: No    Sexually Abused: No    FAMILY HISTORY: Family History  Problem Relation Age of Onset   Heart disease Mother     ALLERGIES:  is allergic to amoxicillin and tizanidine.  MEDICATIONS:  Current Outpatient Medications  Medication Sig Dispense Refill   acetaminophen (TYLENOL) 500 MG tablet Take 1,000 mg by mouth every 6 (six) hours as needed for moderate pain or headache.     albuterol (VENTOLIN HFA) 108 (90 Base) MCG/ACT inhaler Inhale 2 puffs into the lungs every 6 (six) hours as needed for wheezing or shortness of breath. 8 g 11   amLODipine (NORVASC) 10 MG tablet Take 1 tablet (10 mg total) by mouth daily. 90 tablet 3   aspirin EC 81 MG tablet Take 1 tablet (81 mg total) by mouth daily. 90 tablet 3   atorvastatin (LIPITOR) 40 MG tablet Take 1 tablet (40 mg total) by mouth at bedtime. 90 tablet 3   CALCIUM 600/VITAMIN D 600-10 MG-MCG TABS Take 1 tablet by mouth 2 (two) times daily.     chlorpheniramine-HYDROcodone (TUSSIONEX) 10-8 MG/5ML Take 5 mLs by mouth at bedtime as needed for cough. 70 mL 0   clopidogrel (PLAVIX) 75 MG tablet Take 1 tablet (75 mg total) by mouth daily. 90 tablet 3   Dupilumab (DUPIXENT) 300 MG/2ML SOPN Inject 300 mg into the skin every 14 (fourteen) days. 12 mL 1   EPINEPHrine 0.3 mg/0.3 mL IJ  SOAJ injection Inject 0.3 mg into the muscle as needed for anaphylaxis.     fluticasone-salmeterol (ADVAIR DISKUS) 250-50 MCG/ACT AEPB Inhale 1 puff into the lungs in the morning and at bedtime. 60 each 11   gabapentin (NEURONTIN) 300 MG capsule Take 1 capsule (300 mg total) by mouth 3 (three) times daily. 90 capsule 1   ipratropium-albuterol (DUONEB) 0.5-2.5 (3) MG/3ML SOLN INHALE 1 VIAL THROUGH NEBULIZER EVERY 6 HOURS 270 mL 2   KLOR-CON M20 20 MEQ tablet Take 20 mEq by mouth daily.     losartan-hydrochlorothiazide (HYZAAR) 100-25 MG tablet Take 1 tablet by mouth daily.     magnesium oxide (MAG-OX) 400 (241.3 Mg) MG tablet Take 1 tablet (400 mg total) by mouth daily. 30 tablet 0   metoprolol succinate (TOPROL-XL) 50 MG 24 hr tablet Take 1 tablet (50 mg total) by mouth daily. 90 tablet 3   montelukast (SINGULAIR) 10 MG tablet Take 10 mg by mouth daily.     Multiple Vitamin (MULTIVITAMIN WITH MINERALS) TABS tablet Take 1 tablet by mouth daily. 30 tablet 0   pantoprazole (PROTONIX) 40 MG tablet Take 1 tablet (40 mg total) by mouth 2 (two) times daily. 90 tablet 3   predniSONE (DELTASONE) 10 MG tablet Take 1 tablet (10 mg total) by mouth daily as needed. Take as directed. 30 tablet 1   senna-docusate (SENOKOT-S) 8.6-50 MG tablet Take 1 tablet by mouth 2 (two) times daily. 30 tablet 1   Spacer/Aero-Holding Chambers (AEROCHAMBER MV) inhaler Use as instructed 1 each 0   tamsulosin (FLOMAX) 0.4  MG CAPS capsule Take 1 capsule (0.4 mg total) by mouth daily after supper. 30 capsule 2   Ensure Max Protein (ENSURE MAX PROTEIN) LIQD Take 330 mLs (11 oz total) by mouth 2 (two) times daily. (Patient not taking: Reported on 05/10/2023)     No current facility-administered medications for this visit.    REVIEW OF SYSTEMS:   Pertinent information mentioned in HPI All other systems were reviewed with the patient and are negative.  PHYSICAL EXAMINATION: ECOG PERFORMANCE STATUS: 2 - Symptomatic, <50% confined to  bed   Vitals:   05/17/23 1005  BP: 113/62  Pulse: 92  Temp: 98.4 F (36.9 C)  SpO2: 99%    Filed Weights   05/17/23 1005  Weight: 203 lb 1.6 oz (92.1 kg)     GENERAL:alert, no distress and comfortable SKIN: skin color, texture, turgor are normal, no rashes or significant lesions EYES: normal, conjunctiva are pink and non-injected, sclera clear OROPHARYNX:no exudate, no erythema and lips, buccal mucosa, and tongue normal  NECK: supple, thyroid normal size, non-tender, without nodularity LYMPH:  no palpable lymphadenopathy in the cervical, axillary or inguinal LUNGS: clear to auscultation and percussion with normal breathing effort HEART: regular rate & rhythm and no murmurs and no lower extremity edema ABDOMEN:abdomen soft, non-tender and normal bowel sounds Musculoskeletal:no cyanosis of digits and no clubbing  PSYCH: alert & oriented x 3 with fluent speech NEURO: no focal motor/sensory deficits  LABORATORY DATA:  I have reviewed the data as listed Lab Results  Component Value Date   WBC 9.6 05/13/2023   HGB 9.1 (L) 05/13/2023   HCT 28.0 (L) 05/13/2023   MCV 85.6 05/13/2023   PLT 355 05/13/2023   Recent Labs    12/07/22 1019 12/08/22 0239 05/10/23 1244 05/11/23 0257 05/13/23 0514  NA 126*   < > 129* 130* 132*  K 3.9   < > 4.7 4.1 3.9  CL 88*   < > 92* 92* 90*  CO2 28   < > 27 29 31   GLUCOSE 125*   < > 135* 115* 112*  BUN 12   < > 12 18 19   CREATININE 0.65   < > 0.70 0.92 0.68  CALCIUM 9.0   < > 9.2 8.8* 9.1  GFRNONAA >60   < > >60 >60 >60  PROT 7.4  --  7.8 6.9  --   ALBUMIN 4.5  --  4.4 4.1  --   AST 28  --  33 20  --   ALT 22  --  19 18  --   ALKPHOS 68  --  64 63  --   BILITOT 0.6  --  1.0 0.6  --    < > = values in this interval not displayed.    RADIOGRAPHIC STUDIES: I have personally reviewed the radiological images as listed and agreed with the findings in the report. CT HEAD WO CONTRAST ( )  Result Date: 05/12/2023 CLINICAL DATA:   Initial evaluation for mental status change, unknown cause. EXAM: CT HEAD WITHOUT CONTRAST TECHNIQUE: Contiguous axial images were obtained from the base of the skull through the vertex without intravenous contrast. RADIATION DOSE REDUCTION: This exam was performed according to the departmental dose-optimization program which includes automated exposure control, adjustment of the mA and/or kV according to patient size and/or use of iterative reconstruction technique. COMPARISON:  Comparison made with prior MRI from 04/20/2023. FINDINGS: Brain: Cerebral volume within normal limits. 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 No acute intracranial hemorrhage. No acute large vessel territory infarct. No mass lesion or midline shift. No hydrocephalus or extra-axial fluid collection. Vascular: No abnormal hyperdense vessel. Scattered vascular calcifications noted within the carotid siphons. Skull: Scalp soft tissues demonstrate no acute finding. Calvarium intact. Sinuses/Orbits: Globes and orbital soft tissues within normal limits. Scattered mucosal thickening present about the ethmoidal air cells and maxillary sinuses. No significant mastoid effusion. Other: None. IMPRESSION: 1. No acute intracranial abnormality. 2. 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 Electronically Signed   By: Rise Mu M.D.   On: 05/12/2023 01:46   DG Chest Port 1 View  Result Date: 05/11/2023 CLINICAL DATA:  Pneumothorax EXAM: PORTABLE CHEST 1 VIEW COMPARISON:  Chest x-ray 05/11/2023.  Chest x-ray 05/11/2023. FINDINGS: The heart size and mediastinal contours are within normal limits. Right middle lobe masslike opacity is unchanged. Large bullae noted in the lateral right lower hemithorax is unchanged. No definite pneumothorax or pleural effusion identified. No new focal lung infiltrate. No acute fractures. IMPRESSION: 1. No  definite pneumothorax identified. 2. Right middle lobe masslike opacities unchanged. Electronically Signed   By: Darliss Cheney M.D.   On: 05/11/2023 23:43   CT CHEST WO CONTRAST  Result Date: 05/11/2023 CLINICAL DATA:  Follow-up pneumothorax EXAM: CT CHEST WITHOUT CONTRAST TECHNIQUE: Multidetector CT imaging of the chest was performed following the standard protocol without IV contrast. RADIATION DOSE REDUCTION: This exam was performed according to the departmental dose-optimization program which includes automated exposure control, adjustment of the mA and/or kV according to patient size and/or use of iterative reconstruction technique. COMPARISON:  CT 05/10/2023, chest x-ray 05/11/2023 FINDINGS: Cardiovascular: Limited evaluation without intravenous contrast. Advanced aortic atherosclerosis. No aneurysm. Coronary vascular disease. Normal cardiac size. No pericardial effusion Mediastinum/Nodes: Midline trachea. No thyroid mass. Enlarged right hilar nodes better seen on prior contrast enhanced examination. Enlarged right precarinal lymph node measuring 2.1 cm, previously 1.8 cm, series 2, image 51. Esophagus within normal limits. Lungs/Pleura: Emphysema. Large right middle lobe lung mass measuring approximately 7.7 by 6.5 cm on series 4, image 98, stable in the short interval follow-up, prior maximum measurement of 7.7 cm. Very tiny pneumothorax in the right middle lobe anterior to the lung mass, series 4, image 79, not significantly increased in size. Small volume right pleural effusion slightly increased, now measuring closer to water density. Right lower lobe posterior pulmonary nodules are more obscured by pleural fluid and atelectasis. Stable 7 mm medial left lung base pulmonary nodule series 4, image 127. Upper Abdomen: Bilateral adrenal nodules, measuring 2.5 cm on the right with density value of -6.7. Left adrenal nodule with density value of 0.7, this measures 2 cm. Findings would be consistent with  benign adenoma. Hyperdensity in the gallbladder could reflect vicarious excretion of contrast. No calcified stones seen on the recent prior CT. Enlarged porta hepatis node measuring 19 mm, previously 17 mm. Musculoskeletal: Post augmentation changes T7 and T11. chronic bilateral scapular fractures IMPRESSION: 1. Very tiny right anterior pneumothorax, not significantly increased in size. 2. Small volume right pleural effusion slightly increased in size, but now measuring closer to water density. Short-term stability of right middle lobe large lung mass measuring up to 7.7 cm. 3. Right lower lobe suspected metastatic pulmonary nodules are more obscured by pleural fluid and atelectasis on this exam. Stable 7 mm medial left lung base pulmonary nodule. 4. Enlarged mediastinal and right hilar nodes as seen on prior contrast enhanced exam, concerning for metastatic disease. 5. Bilateral  adrenal nodules consistent with benign adenomas. Aortic Atherosclerosis (ICD10-I70.0) and Emphysema (ICD10-J43.9). Electronically Signed   By: Jasmine Pang M.D.   On: 05/11/2023 17:44   [place] RT LOWER EXTREM LTD SOFT TISSUE NON VASCULAR  Result Date: 05/11/2023 CLINICAL DATA:  Foot swelling.  Trauma. EXAM: ULTRASOUND right LOWER EXTREMITY LIMITED TECHNIQUE: Ultrasound examination of the lower extremity soft tissues was performed in the area of clinical concern. COMPARISON:  Foot x-ray 05/11/2023 FINDINGS: Specific ultrasound in the area of concern about the dorsal aspect of the foot demonstrates oblong heterogeneous area which is hypoechoic measuring 3.5 x 1.0 by 4.2 cm. No specific blood flow on Doppler. Margins are somewhat ill-defined. This corresponds to the area of thickening on x-ray. IMPRESSION: 4.2 cm lobular hypoechoic area along the dorsal aspect of the right foot. Possibilities with the patient's history would include hematoma but there is a differential by ultrasound. Recommend either follow-up to confirm resolution and  exclude underlying lesion versus additional workup with MRI as clinically appropriate. Electronically Signed   By: Karen Kays M.D.   On: 05/11/2023 16:12   DG Foot Complete Right  Result Date: 05/11/2023 CLINICAL DATA:  Foot swelling EXAM: RIGHT FOOT COMPLETE - 3+ VIEW COMPARISON:  None Available. FINDINGS: No acute displaced fracture or malalignment. Possible remote fracture involving the fifth proximal phalanx. Mild degenerative change at the first MTP joint. Prominent dorsal soft tissue swelling IMPRESSION: Prominent dorsal soft tissue swelling. No acute osseous abnormality. Electronically Signed   By: Jasmine Pang M.D.   On: 05/11/2023 15:47   DG Chest 2 View  Result Date: 05/11/2023 CLINICAL DATA:  Follow-up pneumothorax EXAM: CHEST - 2 VIEW COMPARISON:  CT 05/10/2023 FINDINGS: Emphysema. Large bulla in the right lower lung. Small right-sided pleural effusion. Atelectasis or scarring in the left lower lung. Large right middle lobe lung mass. No visible pneumothorax. Right scapular fracture with callus. IMPRESSION: 1. No visible pneumothorax. 2. Large right middle lobe lung mass. 3. Small right pleural effusion. 4. Emphysema. Electronically Signed   By: Jasmine Pang M.D.   On: 05/11/2023 15:43   CT CHEST ABDOMEN PELVIS W CONTRAST  Result Date: 05/10/2023 CLINICAL DATA:  Chest pain and abdominal pain and shortness of breath following lung biopsy EXAM: CT CHEST, ABDOMEN, AND PELVIS WITH CONTRAST TECHNIQUE: Multidetector CT imaging of the chest, abdomen and pelvis was performed following the standard protocol during bolus administration of intravenous contrast. RADIATION DOSE REDUCTION: This exam was performed according to the departmental dose-optimization program which includes automated exposure control, adjustment of the mA and/or kV according to patient size and/or use of iterative reconstruction technique. CONTRAST:  OMNIPAQUE IOHEXOL 300 MG/ML  SOLN COMPARISON:  PET-CT 04/19/2023, CT  04/03/2023 FINDINGS: CT CHEST FINDINGS Cardiovascular: Heart size within normal limits. No pericardial effusion. Thoracic aorta is nonaneurysmal. Extensive atherosclerotic calcifications of the aorta and coronary arteries. Central pulmonary vasculature is within normal limits. Mediastinum/Nodes: Pathologically enlarged mediastinal and right hilar lymph nodes including reference precarinal node measuring 1.8 cm short axis (previously 1.7 cm) and 1.5 cm right hilar node. No axillary or left hilar lymphadenopathy. Lungs/Pleura: Small right pleural effusion with internal density of 40 HU, new since 04/19/2023, which could be related to malignant effusion versus small hemothorax in the setting of recent lung biopsy. Trace right pneumothorax anteriorly adjacent to the large right lung mass (series 4, image 89). Right middle lobe lung mass has slightly increased in size since 04/19/2023, now measuring 7.7 x 6.0 cm transaxially (previously 7.1 x 5.5 cm). Continued  enlargement of 2 right lower lobe nodules measuring 2.2 cm and 1.3 cm (previously 1.8 cm and 1.0 cm). New 0.7 cm left lower lobe nodule (series 4, image 128). Background of advanced emphysema. Large bulla in the right middle lobe. Musculoskeletal: Prior cement augmentation of the T6 and T10 vertebral bodies. Chronic bilateral scapular fractures. No discrete lytic or sclerotic bone lesion. No chest wall hematoma. CT ABDOMEN PELVIS FINDINGS Hepatobiliary: Subcentimeter low-density lesion within the right hepatic lobe is too small to characterize (series 2, image 54). Liver appears otherwise unremarkable. Unremarkable gallbladder. No hyperdense gallstone. No biliary dilatation. Pancreas: Unremarkable. No pancreatic ductal dilatation or surrounding inflammatory changes. Spleen: Normal in size without focal abnormality. Adrenals/Urinary Tract: Stable benign adrenal adenomas which do not require follow-up imaging. Areas of cortical scarring of both kidneys. No renal  stone or hydronephrosis. Urinary bladder within normal limits. Stomach/Bowel: Stomach is within normal limits. Scattered colonic diverticulosis. No evidence of bowel wall thickening, distention, or inflammatory changes. Vascular/Lymphatic: Aortic atherosclerosis. Enlarged lymph node in the porta hepatis measuring 1.6 cm. No enlarged pelvic lymph nodes. Reproductive: Prostate is unremarkable. Other: No free fluid. No abdominopelvic fluid collection. No pneumoperitoneum. Musculoskeletal: No acute or significant osseous findings. IMPRESSION: 1. Trace right pneumothorax anteriorly adjacent to the large right lung mass. 2. Small intermediate-high density right pleural effusion, new since 04/19/2023, which could be related to malignant effusion versus small hemothorax in the setting of recent lung biopsy. 3. Right middle lobe lung mass has slightly increased in size since 04/19/2023, now measuring 7.7 x 6.0 cm transaxially (previously 7.1 x 5.5 cm). 4. Continued enlargement of two right lower lobe nodules measuring 2.2 cm and 1.3 cm (previously 1.8 cm and 1.0 cm). New 0.7 cm left lower lobe nodule. Findings are compatible with progression of metastatic disease. 5. Pathologically enlarged mediastinal, right hilar, and upper abdominal lymph nodes, also compatible with metastatic disease. Aortic Atherosclerosis (ICD10-I70.0) and Emphysema (ICD10-J43.9). Electronically Signed   By: Duanne Guess D.O.   On: 05/10/2023 15:11   DG Chest 2 View  Result Date: 05/10/2023 CLINICAL DATA:  Shortness of breath following biopsy EXAM: CHEST - 2 VIEW COMPARISON:  05/10/2023 FINDINGS: Stable aeration and chronic emphysema change with right lung bullous disease and basilar scarring. Large anterior right middle lobe mass again noted, biopsied earlier today. No enlarging or significant pneumothorax by plain radiography. No developing pleural effusion. No new collapse or consolidation. Trachea midline. Normal heart size and vascularity.  Chronic osseous changes and previous vertebral augmentations noted on the lateral view. IMPRESSION: 1. Stable chronic emphysema and right lung bullous disease. 2. Stable large right middle lobe mass 3. No complicating feature by plain radiography. Electronically Signed   By: Judie Petit.  Shick M.D.   On: 05/10/2023 13:29   DG Chest Port 1 View  Result Date: 05/10/2023 CLINICAL DATA:  Large right middle lobe mass status post biopsy EXAM: PORTABLE CHEST 1 VIEW COMPARISON:  05/10/2023, 04/03/2019 FINDINGS: Large right middle lobe mass again noted. No effusion or significant pneumothorax following biopsy by plain radiography. Background emphysema noted. Mid and lower lung parenchymal scarring evident. Stable heart size and vascularity. No edema. No new collapse or consolidation. IMPRESSION: Large right middle lobe mass, recently biopsied. No complicating feature by plain radiography. Electronically Signed   By: Judie Petit.  Shick M.D.   On: 05/10/2023 11:53   CT LUNG MASS BIOPSY  Result Date: 05/10/2023 INDICATION: Large anterior right middle lobe mass EXAM: CT-GUIDED BIOPSY CORE BIOPSY LARGE RIGHT MIDDLE LOBE MASS MEDICATIONS: 1% LIDOCAINE LOCAL  ANESTHESIA/SEDATION: 0 mg IV Versed; 25 mcg IV Fentanyl Moderate Sedation Time:  NONE. The patient was continuously monitored during the procedure by the interventional radiology nurse under my direct supervision. PROCEDURE: The procedure, risks, benefits, and alternatives were explained to the patient. Questions regarding the procedure were encouraged and answered. The patient understands and consents to the procedure. Previous imaging reviewed. Patient positioned supine. Noncontrast localization CT performed. The anterior large right middle lobe mass abutting the anterior chest wall was localized and marked for biopsy. Under sterile conditions and local anesthesia, the 17 gauge core biopsy needle was advanced from an anterior intercostal approach to the lesion. Needle position  confirmed with CT. 2 18 gauge core biopsies obtained. Samples were intact and non fragmented. These were placed in formalin. Needle tract occluded with the bio sentry device. Postprocedure imaging demonstrates no complicating feature. Stable emphysema and adjacent bullous disease. Patient tolerated the procedure well without complication. Vital sign monitoring by nursing staff during the procedure will continue as patient is in the special procedures unit for post procedure observation. FINDINGS: The images document guide needle placement within the large anterior right middle lobe mass. Post biopsy images demonstrate no hemorrhage, hematoma, large effusion, or pneumothorax. COMPLICATIONS: None immediate. IMPRESSION: Successful CT-guided core biopsy of the large anterior right middle lobe mass RADIATION DOSE REDUCTION: This exam was performed according to the departmental dose-optimization program which includes automated exposure control, adjustment of the mA and/or kV according to patient size and/or use of iterative reconstruction technique. Electronically Signed   By: Judie Petit.  Shick M.D.   On: 05/10/2023 11:51

## 2023-05-18 ENCOUNTER — Telehealth: Payer: Self-pay

## 2023-05-18 ENCOUNTER — Encounter: Payer: Self-pay | Admitting: Internal Medicine

## 2023-05-18 DIAGNOSIS — C3490 Malignant neoplasm of unspecified part of unspecified bronchus or lung: Secondary | ICD-10-CM | POA: Insufficient documentation

## 2023-05-18 NOTE — Telephone Encounter (Signed)
Patients son left message stating he needs to schedule an appointment with Dr A.

## 2023-05-18 NOTE — Telephone Encounter (Signed)
Looks like her wrap up from yesterday says RTC in 3 weeks for MD visit to finalize the treatment plan

## 2023-05-19 ENCOUNTER — Other Ambulatory Visit: Payer: Self-pay

## 2023-05-20 ENCOUNTER — Telehealth: Payer: Self-pay | Admitting: Pulmonary Disease

## 2023-05-20 ENCOUNTER — Encounter: Payer: Self-pay | Admitting: Internal Medicine

## 2023-05-20 NOTE — Telephone Encounter (Signed)
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 There are no drug-drug interactions between Dupixent and Keytruda. I had discussed this with Dr. Alena Bills. In some preliminary studies, Dupixent has increased the effectiveness of the cancer therapy.

## 2023-05-20 NOTE — Telephone Encounter (Signed)
I have notified the patient. Nothing further needed. 

## 2023-05-21 ENCOUNTER — Other Ambulatory Visit: Payer: Self-pay | Admitting: Student

## 2023-05-22 ENCOUNTER — Other Ambulatory Visit: Payer: Self-pay

## 2023-05-24 ENCOUNTER — Other Ambulatory Visit: Payer: Self-pay | Admitting: Radiology

## 2023-05-25 ENCOUNTER — Ambulatory Visit: Payer: Medicare HMO | Admitting: Pulmonary Disease

## 2023-05-25 ENCOUNTER — Encounter: Payer: Self-pay | Admitting: Internal Medicine

## 2023-05-25 ENCOUNTER — Ambulatory Visit: Payer: Medicare HMO | Admitting: Internal Medicine

## 2023-05-25 NOTE — Progress Notes (Signed)
Pharmacist Chemotherapy Monitoring - Initial Assessment    Anticipated start date: 06/01/23   The following has been reviewed per standard work regarding the patient's treatment regimen: The patient's diagnosis, treatment plan and drug doses, and organ/hematologic function Lab orders and baseline tests specific to treatment regimen  The treatment plan start date, drug sequencing, and pre-medications Prior authorization status  Patient's documented medication list, including drug-drug interaction screen and prescriptions for anti-emetics and supportive care specific to the treatment regimen The drug concentrations, fluid compatibility, administration routes, and timing of the medications to be used The patient's access for treatment and lifetime cumulative dose history, if applicable  The patient's medication allergies and previous infusion related reactions, if applicable   Changes made to treatment plan:  N/A  Follow up needed:  prescriptions needed for anti-emetics   Ebony Hail, Pharm.D., CPP 05/25/2023@5 :02 PM

## 2023-05-25 NOTE — Progress Notes (Signed)
Patient for IR Port Insertion on Wed 05/26/2023, I called and spoke with the patient on the phone and gave pre-procedure instructions. Pt was made aware to be here at 9a, NPO after MN prior to procedure as well as driver post procedure/recovery/discharge. Pt stated understanding.  Called 05/25/2023

## 2023-05-26 ENCOUNTER — Ambulatory Visit
Admission: RE | Admit: 2023-05-26 | Discharge: 2023-05-26 | Disposition: A | Discharge status: Home / Self Care | Payer: Medicare HMO | Source: Ambulatory Visit | Attending: Internal Medicine | Admitting: Internal Medicine

## 2023-05-26 ENCOUNTER — Other Ambulatory Visit: Payer: Self-pay

## 2023-05-26 ENCOUNTER — Encounter: Payer: Self-pay | Admitting: Radiology

## 2023-05-26 DIAGNOSIS — J449 Chronic obstructive pulmonary disease, unspecified: Secondary | ICD-10-CM | POA: Diagnosis not present

## 2023-05-26 DIAGNOSIS — J8410 Pulmonary fibrosis, unspecified: Secondary | ICD-10-CM | POA: Diagnosis not present

## 2023-05-26 DIAGNOSIS — E785 Hyperlipidemia, unspecified: Secondary | ICD-10-CM | POA: Diagnosis not present

## 2023-05-26 DIAGNOSIS — I10 Essential (primary) hypertension: Secondary | ICD-10-CM | POA: Diagnosis not present

## 2023-05-26 DIAGNOSIS — C3490 Malignant neoplasm of unspecified part of unspecified bronchus or lung: Secondary | ICD-10-CM

## 2023-05-26 DIAGNOSIS — C3491 Malignant neoplasm of unspecified part of right bronchus or lung: Secondary | ICD-10-CM | POA: Diagnosis not present

## 2023-05-26 DIAGNOSIS — K219 Gastro-esophageal reflux disease without esophagitis: Secondary | ICD-10-CM | POA: Diagnosis not present

## 2023-05-26 DIAGNOSIS — Z87891 Personal history of nicotine dependence: Secondary | ICD-10-CM | POA: Diagnosis not present

## 2023-05-26 HISTORY — PX: IR IMAGING GUIDED PORT INSERTION: IMG5740

## 2023-05-26 MED ORDER — SODIUM CHLORIDE 0.9 % IV SOLN: INTRAVENOUS | Status: DC

## 2023-05-26 MED ORDER — LIDOCAINE-EPINEPHRINE 1 %-1:100000 IJ SOLN
INTRAMUSCULAR | Status: AC
  Filled 2023-05-26: qty 1

## 2023-05-26 MED ORDER — MIDAZOLAM HCL 2 MG/2ML IJ SOLN
INTRAMUSCULAR | Status: AC | PRN
  Administered 2023-05-26: 1 mg via INTRAVENOUS

## 2023-05-26 MED ORDER — HEPARIN SOD (PORK) LOCK FLUSH 100 UNIT/ML IV SOLN
500.0000 [IU] | Freq: Once | INTRAVENOUS | Status: AC
  Administered 2023-05-26: 500 [IU] via INTRAVENOUS

## 2023-05-26 MED ORDER — MIDAZOLAM HCL 2 MG/2ML IJ SOLN
INTRAMUSCULAR | Status: AC
  Filled 2023-05-26: qty 4

## 2023-05-26 MED ORDER — FENTANYL CITRATE (PF) 100 MCG/2ML IJ SOLN
INTRAMUSCULAR | Status: AC
  Filled 2023-05-26: qty 2

## 2023-05-26 MED ORDER — FENTANYL CITRATE (PF) 100 MCG/2ML IJ SOLN
INTRAMUSCULAR | Status: AC | PRN
  Administered 2023-05-26: 50 ug via INTRAVENOUS

## 2023-05-26 MED ORDER — HEPARIN SOD (PORK) LOCK FLUSH 100 UNIT/ML IV SOLN
INTRAVENOUS | Status: AC
  Filled 2023-05-26: qty 5

## 2023-05-26 MED ORDER — LIDOCAINE-EPINEPHRINE 1 %-1:100000 IJ SOLN
20.0000 mL | Freq: Once | INTRAMUSCULAR | Status: AC
  Administered 2023-05-26: 15 mL via INTRADERMAL

## 2023-05-26 NOTE — H&P (Signed)
Chief Complaint: Patient was seen in consultation today for right lung cancer  Referring Physician(s): Agrawal,Kavita  Supervising Physician: Pernell Dupre  Patient Status: [facility] - Out-pt  History of Present Illness: Austin Patterson is a 67 y.o. male with PMH significant for chest pain, COPD, GERD, hyperlipidemia, hypertension, pneumothorax after biopsy, and pulmonary fibrosis being seen today in relation to right lung cancer. 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 Patient will be undergoing chemotherapy under the care of Dr Alena Bills, who has requested image-guided port placement to facilitate chemotherapy.  Past Medical History:  Diagnosis Date   Abdominal pain 12/07/2022   Acute urinary retention 12/07/2022   Chest pain 01/24/2022   COPD (chronic obstructive pulmonary disease) (HCC)    GERD (gastroesophageal reflux disease)    Hemothorax on right 05/11/2023   Hyperlipidemia    Hypertension    Pleuritic pain 05/10/2023   Pneumothorax after biopsy 05/11/2023   Post procedure discomfort 05/13/2023   Pulmonary fibrosis (HCC) 11/2015    Past Surgical History:  Procedure Laterality Date   COLONOSCOPY     CORONARY STENT PLACEMENT     ESOPHAGOGASTRODUODENOSCOPY (EGD) WITH PROPOFOL N/A 09/23/2016   Procedure: ESOPHAGOGASTRODUODENOSCOPY (EGD) WITH PROPOFOL;  Surgeon: Wyline Mood, MD;  Location: [facility] ENDOSCOPY;  Service: Endoscopy;  Laterality: N/A;   KYPHOPLASTY N/A 03/14/2020   Procedure: T7 & T11 KYPHOPLASTY;  Surgeon: Kennedy Bucker, MD;  Location: [facility] ORS;  Service: Orthopedics;  Laterality: N/A;   SHOULDER ACROMIOPLASTY      Allergies: Amoxicillin and Tizanidine  Medications: Prior to Admission medications   Medication Sig Start Date End Date Taking? Authorizing Provider  acetaminophen (TYLENOL) 500 MG tablet Take 1,000 mg by mouth every 6 (six) hours as  needed for moderate pain or headache.   Yes [provider]  albuterol (VENTOLIN HFA) 108 (90 Base) MCG/ACT inhaler Inhale 2 puffs into the lungs every 6 (six) hours as needed for wheezing or shortness of breath. 01/29/20  Yes Danford, Earl Lites, MD  amLODipine (NORVASC) 10 MG tablet Take 1 tablet (10 mg total) by mouth daily. 08/19/16  Yes Virl Axe, MD  aspirin EC 81 MG tablet Take 1 tablet (81 mg total) by mouth daily. 08/19/16  Yes Virl Axe, MD  atorvastatin (LIPITOR) 40 MG tablet Take 1 tablet (40 mg total) by mouth at bedtime. 08/19/16  Yes Virl Axe, MD  CALCIUM 600/VITAMIN D 600-10 MG-MCG TABS Take 1 tablet by mouth 2 (two) times daily. 11/20/21  Yes [provider]  chlorpheniramine-HYDROcodone (TUSSIONEX) 10-8 MG/5ML Take 5 mLs by mouth at bedtime as needed for cough. 04/05/23  Yes Sunnie Nielsen, DO  clopidogrel (PLAVIX) 75 MG tablet Take 1 tablet (75 mg total) by mouth daily. 08/19/16  Yes Virl Axe, MD  Dupilumab (DUPIXENT) 300 MG/2ML SOPN Inject 300 mg into the skin every 14 (fourteen) days. 11/04/22  Yes Salena Saner, MD  fluticasone-salmeterol (ADVAIR DISKUS) 250-50 MCG/ACT AEPB Inhale 1 puff into the lungs in the morning and at bedtime. 03/04/22  Yes Salena Saner, MD  gabapentin (NEURONTIN) 300 MG capsule Take 1 capsule (300 mg total) by mouth 3 (three) times daily. 04/27/23  Yes Salena Saner, MD  ipratropium-albuterol (DUONEB) 0.5-2.5 (3) MG/3ML SOLN INHALE 1 VIAL THROUGH NEBULIZER EVERY 6 HOURS 10/01/22  Yes Salena Saner, MD  KLOR-CON M20 20 MEQ tablet Take 20 mEq by mouth daily. 11/20/21  Yes [provider]  losartan-hydrochlorothiazide (HYZAAR) 100-25 MG tablet Take 1 tablet by mouth daily. 11/19/21  Yes [provider]  magnesium oxide (MAG-OX) 400 (241.3 Mg) MG tablet Take 1 tablet (400 mg total) by mouth daily. 06/18/17  Yes Sudini, Wardell Heath, MD  metoprolol succinate (TOPROL-XL) 50 MG 24 hr tablet Take 1  tablet (50 mg total) by mouth daily. 08/19/16  Yes Virl Axe, MD  montelukast (SINGULAIR) 10 MG tablet Take 10 mg by mouth daily. 11/19/21  Yes [provider]  Multiple Vitamin (MULTIVITAMIN WITH MINERALS) TABS tablet Take 1 tablet by mouth daily. 09/24/16  Yes Enedina Finner, MD  pantoprazole (PROTONIX) 40 MG tablet Take 1 tablet (40 mg total) by mouth 2 (two) times daily. 09/23/16  Yes Enedina Finner, MD  predniSONE (DELTASONE) 10 MG tablet Take 1 tablet (10 mg total) by mouth daily as needed. Take as directed. 04/27/23  Yes Salena Saner, MD  senna-docusate (SENOKOT-S) 8.6-50 MG tablet Take 1 tablet by mouth 2 (two) times daily. 12/09/22  Yes Esaw Grandchild A, DO  tamsulosin (FLOMAX) 0.4 MG CAPS capsule Take 1 capsule (0.4 mg total) by mouth daily after supper. 12/09/22  Yes Pennie Banter, DO  Ensure Max Protein (ENSURE MAX PROTEIN) LIQD Take 330 mLs (11 oz total) by mouth 2 (two) times daily. Patient not taking: Reported on 05/10/2023 12/09/22   Esaw Grandchild A, DO  EPINEPHrine 0.3 mg/0.3 mL IJ SOAJ injection Inject 0.3 mg into the muscle as needed for anaphylaxis.    [provider]  Spacer/Aero-Holding Chambers (AEROCHAMBER MV) inhaler Use as instructed 10/05/19   Salena Saner, MD     Family History  Problem Relation Age of Onset   Heart disease Mother     Social History   Socioeconomic History   Marital status: Legally Separated    Spouse name: Not on file   Number of children: Not on file   Years of education: Not on file   Highest education level: Not on file  Occupational History   Not on file  Tobacco Use   Smoking status: Former    Current packs/day: 0.00    Average packs/day: 2.0 packs/day for 50.0 years (100.0 ttl pk-yrs)    Types: Cigarettes    Start date: 10/13/1965    Quit date: 10/14/2015    Years since quitting: 7.6   Smokeless tobacco: Former  Building services engineer status: Never Used  Substance and Sexual Activity   Alcohol use: Yes     Alcohol/week: 56.0 standard drinks of alcohol    Types: 56 Cans of beer per week    Comment: "I sit around and drink beer, that's all I got to do"   Drug use: No   Sexual activity: Not Currently  Other Topics Concern   Not on file  Social History Narrative   Not on file   Social Determinants of Health   Financial Resource Strain: Not on file  Food Insecurity: No Food Insecurity (04/03/2023)   Hunger Vital Sign    Worried About Running Out of Food in the Last Year: Never true    Ran Out of Food in the Last Year: Never true  Transportation Needs: No Transportation Needs (04/03/2023)   PRAPARE - Administrator, Civil Service (Medical): No    Lack of Transportation (Non-Medical): No  Physical Activity: Not on file  Stress: Not on file  Social Connections: Not on file    Code Status: Full code  Review of Systems: A 12 point ROS discussed and pertinent positives are indicated in the HPI above.  All other systems are negative.  Review of Systems  Constitutional:  Negative for chills and fever.  Respiratory:  Positive for shortness of breath. Negative for chest tightness.        Patient states he has chronic SOB from his COPD  Cardiovascular:  Positive for leg swelling. Negative for chest pain.  Gastrointestinal:  Negative for abdominal pain, diarrhea, nausea and vomiting.  Neurological:  Negative for dizziness and headaches.  Psychiatric/Behavioral:  Negative for confusion.     Vital Signs: BP 118/68   Pulse 89   Temp 98 F (36.7 C) (Oral)   Resp (!) 21   Ht 5\' 6"  (1.676 m)   Wt 204 lb (92.5 kg)   SpO2 94%   BMI 32.93 kg/m    Physical Exam Vitals reviewed.  Constitutional:      General: He is not in acute distress.    Appearance: He is ill-appearing.  HENT:     Mouth/Throat:     Mouth: Mucous membranes are moist.  Cardiovascular:     Rate and Rhythm: Normal rate and regular rhythm.     Pulses: Normal pulses.     Heart sounds: Normal heart sounds.   Pulmonary:     Breath sounds: Normal breath sounds.     Comments: Patient on 3L of O2 via nasal cannula. He is on this amount of O2 at home as well Abdominal:     Palpations: Abdomen is soft.     Tenderness: There is no abdominal tenderness.  Musculoskeletal:     Right lower leg: Edema present.     Left lower leg: Edema present.     Comments: Mild 1+ pitting edema in bilateral lower extremities  Skin:    General: Skin is warm and dry.  Neurological:     Mental Status: He is alert and oriented to person, place, and time.  Psychiatric:        Mood and Affect: Mood normal.        Behavior: Behavior normal.        Thought Content: Thought content normal.        Judgment: Judgment normal.     Imaging: CT HEAD WO CONTRAST ( )  Result Date: 05/12/2023 CLINICAL DATA:  Initial evaluation for mental status change, unknown cause. EXAM: CT HEAD WITHOUT CONTRAST TECHNIQUE: Contiguous axial images were obtained from the base of the skull through the vertex without intravenous contrast. RADIATION DOSE REDUCTION: This exam was performed according to the departmental dose-optimization program which includes automated exposure control, adjustment of the mA and/or kV according to patient size and/or use of iterative reconstruction technique. COMPARISON:  Comparison made with prior MRI from 04/20/2023. FINDINGS: Brain: Cerebral volume within normal limits. 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 No acute intracranial hemorrhage. No acute large vessel territory infarct. No mass lesion or midline shift. No hydrocephalus or extra-axial fluid collection. Vascular: No abnormal hyperdense vessel. Scattered vascular calcifications noted within the carotid siphons. Skull: Scalp soft tissues demonstrate no acute finding. Calvarium intact. Sinuses/Orbits: Globes and orbital soft tissues within normal limits. Scattered mucosal thickening present about the  ethmoidal air cells and maxillary sinuses. No significant mastoid effusion. Other: None. IMPRESSION: 1. No acute intracranial abnormality. 2. 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 Electronically Signed   By: Rise Mu M.D.   On: 05/12/2023 01:46  DG Chest Port 1 View  Result Date: 05/11/2023 CLINICAL DATA:  Pneumothorax EXAM: PORTABLE CHEST 1 VIEW COMPARISON:  Chest x-ray 05/11/2023.  Chest x-ray 05/11/2023. FINDINGS: The heart size and mediastinal contours are within normal limits. Right middle lobe masslike opacity is unchanged. Large bullae noted in the lateral right lower hemithorax is unchanged. No definite pneumothorax or pleural effusion identified. No new focal lung infiltrate. No acute fractures. IMPRESSION: 1. No definite pneumothorax identified. 2. Right middle lobe masslike opacities unchanged. Electronically Signed   By: Darliss Cheney M.D.   On: 05/11/2023 23:43   CT CHEST WO CONTRAST  Result Date: 05/11/2023 CLINICAL DATA:  Follow-up pneumothorax EXAM: CT CHEST WITHOUT CONTRAST TECHNIQUE: Multidetector CT imaging of the chest was performed following the standard protocol without IV contrast. RADIATION DOSE REDUCTION: This exam was performed according to the departmental dose-optimization program which includes automated exposure control, adjustment of the mA and/or kV according to patient size and/or use of iterative reconstruction technique. COMPARISON:  CT 05/10/2023, chest x-ray 05/11/2023 FINDINGS: Cardiovascular: Limited evaluation without intravenous contrast. Advanced aortic atherosclerosis. No aneurysm. Coronary vascular disease. Normal cardiac size. No pericardial effusion Mediastinum/Nodes: Midline trachea. No thyroid mass. Enlarged right hilar nodes better seen on prior contrast enhanced examination. Enlarged right precarinal lymph node measuring 2.1 cm, previously 1.8 cm, series 2, image 51. Esophagus within normal  limits. Lungs/Pleura: Emphysema. Large right middle lobe lung mass measuring approximately 7.7 by 6.5 cm on series 4, image 98, stable in the short interval follow-up, prior maximum measurement of 7.7 cm. Very tiny pneumothorax in the right middle lobe anterior to the lung mass, series 4, image 79, not significantly increased in size. Small volume right pleural effusion slightly increased, now measuring closer to water density. Right lower lobe posterior pulmonary nodules are more obscured by pleural fluid and atelectasis. Stable 7 mm medial left lung base pulmonary nodule series 4, image 127. Upper Abdomen: Bilateral adrenal nodules, measuring 2.5 cm on the right with density value of -6.7. Left adrenal nodule with density value of 0.7, this measures 2 cm. Findings would be consistent with benign adenoma. Hyperdensity in the gallbladder could reflect vicarious excretion of contrast. No calcified stones seen on the recent prior CT. Enlarged porta hepatis node measuring 19 mm, previously 17 mm. Musculoskeletal: Post augmentation changes T7 and T11. chronic bilateral scapular fractures IMPRESSION: 1. Very tiny right anterior pneumothorax, not significantly increased in size. 2. Small volume right pleural effusion slightly increased in size, but now measuring closer to water density. Short-term stability of right middle lobe large lung mass measuring up to 7.7 cm. 3. Right lower lobe suspected metastatic pulmonary nodules are more obscured by pleural fluid and atelectasis on this exam. Stable 7 mm medial left lung base pulmonary nodule. 4. Enlarged mediastinal and right hilar nodes as seen on prior contrast enhanced exam, concerning for metastatic disease. 5. Bilateral adrenal nodules consistent with benign adenomas. Aortic Atherosclerosis (ICD10-I70.0) and Emphysema (ICD10-J43.9). Electronically Signed   By: Jasmine Pang M.D.   On: 05/11/2023 17:44   [place] RT LOWER EXTREM LTD SOFT TISSUE NON VASCULAR  Result Date:  05/11/2023 CLINICAL DATA:  Foot swelling.  Trauma. EXAM: ULTRASOUND right LOWER EXTREMITY LIMITED TECHNIQUE: Ultrasound examination of the lower extremity soft tissues was performed in the area of clinical concern. COMPARISON:  Foot x-ray 05/11/2023 FINDINGS: Specific ultrasound in the area of concern about the dorsal aspect of the foot demonstrates oblong heterogeneous area which is hypoechoic measuring 3.5 x 1.0 by 4.2 cm. No specific  blood flow on Doppler. Margins are somewhat ill-defined. This corresponds to the area of thickening on x-ray. IMPRESSION: 4.2 cm lobular hypoechoic area along the dorsal aspect of the right foot. Possibilities with the patient's history would include hematoma but there is a differential by ultrasound. Recommend either follow-up to confirm resolution and exclude underlying lesion versus additional workup with MRI as clinically appropriate. Electronically Signed   By: Karen Kays M.D.   On: 05/11/2023 16:12   DG Foot Complete Right  Result Date: 05/11/2023 CLINICAL DATA:  Foot swelling EXAM: RIGHT FOOT COMPLETE - 3+ VIEW COMPARISON:  None Available. FINDINGS: No acute displaced fracture or malalignment. Possible remote fracture involving the fifth proximal phalanx. Mild degenerative change at the first MTP joint. Prominent dorsal soft tissue swelling IMPRESSION: Prominent dorsal soft tissue swelling. No acute osseous abnormality. Electronically Signed   By: Jasmine Pang M.D.   On: 05/11/2023 15:47   DG Chest 2 View  Result Date: 05/11/2023 CLINICAL DATA:  Follow-up pneumothorax EXAM: CHEST - 2 VIEW COMPARISON:  CT 05/10/2023 FINDINGS: Emphysema. Large bulla in the right lower lung. Small right-sided pleural effusion. Atelectasis or scarring in the left lower lung. Large right middle lobe lung mass. No visible pneumothorax. Right scapular fracture with callus. IMPRESSION: 1. No visible pneumothorax. 2. Large right middle lobe lung mass. 3. Small right pleural effusion. 4.  Emphysema. Electronically Signed   By: Jasmine Pang M.D.   On: 05/11/2023 15:43   CT CHEST ABDOMEN PELVIS W CONTRAST  Result Date: 05/10/2023 CLINICAL DATA:  Chest pain and abdominal pain and shortness of breath following lung biopsy EXAM: CT CHEST, ABDOMEN, AND PELVIS WITH CONTRAST TECHNIQUE: Multidetector CT imaging of the chest, abdomen and pelvis was performed following the standard protocol during bolus administration of intravenous contrast. RADIATION DOSE REDUCTION: This exam was performed according to the departmental dose-optimization program which includes automated exposure control, adjustment of the mA and/or kV according to patient size and/or use of iterative reconstruction technique. CONTRAST:  OMNIPAQUE IOHEXOL 300 MG/ML  SOLN COMPARISON:  PET-CT 04/19/2023, CT 04/03/2023 FINDINGS: CT CHEST FINDINGS Cardiovascular: Heart size within normal limits. No pericardial effusion. Thoracic aorta is nonaneurysmal. Extensive atherosclerotic calcifications of the aorta and coronary arteries. Central pulmonary vasculature is within normal limits. Mediastinum/Nodes: Pathologically enlarged mediastinal and right hilar lymph nodes including reference precarinal node measuring 1.8 cm short axis (previously 1.7 cm) and 1.5 cm right hilar node. No axillary or left hilar lymphadenopathy. Lungs/Pleura: Small right pleural effusion with internal density of 40 HU, new since 04/19/2023, which could be related to malignant effusion versus small hemothorax in the setting of recent lung biopsy. Trace right pneumothorax anteriorly adjacent to the large right lung mass (series 4, image 89). Right middle lobe lung mass has slightly increased in size since 04/19/2023, now measuring 7.7 x 6.0 cm transaxially (previously 7.1 x 5.5 cm). Continued enlargement of 2 right lower lobe nodules measuring 2.2 cm and 1.3 cm (previously 1.8 cm and 1.0 cm). New 0.7 cm left lower lobe nodule (series 4, image 128). Background of  advanced emphysema. Large bulla in the right middle lobe. Musculoskeletal: Prior cement augmentation of the T6 and T10 vertebral bodies. Chronic bilateral scapular fractures. No discrete lytic or sclerotic bone lesion. No chest wall hematoma. CT ABDOMEN PELVIS FINDINGS Hepatobiliary: Subcentimeter low-density lesion within the right hepatic lobe is too small to characterize (series 2, image 54). Liver appears otherwise unremarkable. Unremarkable gallbladder. No hyperdense gallstone. No biliary dilatation. Pancreas: Unremarkable. No pancreatic ductal dilatation  or surrounding inflammatory changes. Spleen: Normal in size without focal abnormality. Adrenals/Urinary Tract: Stable benign adrenal adenomas which do not require follow-up imaging. Areas of cortical scarring of both kidneys. No renal stone or hydronephrosis. Urinary bladder within normal limits. Stomach/Bowel: Stomach is within normal limits. Scattered colonic diverticulosis. No evidence of bowel wall thickening, distention, or inflammatory changes. Vascular/Lymphatic: Aortic atherosclerosis. Enlarged lymph node in the porta hepatis measuring 1.6 cm. No enlarged pelvic lymph nodes. Reproductive: Prostate is unremarkable. Other: No free fluid. No abdominopelvic fluid collection. No pneumoperitoneum. Musculoskeletal: No acute or significant osseous findings. IMPRESSION: 1. Trace right pneumothorax anteriorly adjacent to the large right lung mass. 2. Small intermediate-high density right pleural effusion, new since 04/19/2023, which could be related to malignant effusion versus small hemothorax in the setting of recent lung biopsy. 3. Right middle lobe lung mass has slightly increased in size since 04/19/2023, now measuring 7.7 x 6.0 cm transaxially (previously 7.1 x 5.5 cm). 4. Continued enlargement of two right lower lobe nodules measuring 2.2 cm and 1.3 cm (previously 1.8 cm and 1.0 cm). New 0.7 cm left lower lobe nodule. Findings are compatible with  progression of metastatic disease. 5. Pathologically enlarged mediastinal, right hilar, and upper abdominal lymph nodes, also compatible with metastatic disease. Aortic Atherosclerosis (ICD10-I70.0) and Emphysema (ICD10-J43.9). Electronically Signed   By: Duanne Guess D.O.   On: 05/10/2023 15:11   DG Chest 2 View  Result Date: 05/10/2023 CLINICAL DATA:  Shortness of breath following biopsy EXAM: CHEST - 2 VIEW COMPARISON:  05/10/2023 FINDINGS: Stable aeration and chronic emphysema change with right lung bullous disease and basilar scarring. Large anterior right middle lobe mass again noted, biopsied earlier today. No enlarging or significant pneumothorax by plain radiography. No developing pleural effusion. No new collapse or consolidation. Trachea midline. Normal heart size and vascularity. Chronic osseous changes and previous vertebral augmentations noted on the lateral view. IMPRESSION: 1. Stable chronic emphysema and right lung bullous disease. 2. Stable large right middle lobe mass 3. No complicating feature by plain radiography. Electronically Signed   By: Judie Petit.  Shick M.D.   On: 05/10/2023 13:29   DG Chest Port 1 View  Result Date: 05/10/2023 CLINICAL DATA:  Large right middle lobe mass status post biopsy EXAM: PORTABLE CHEST 1 VIEW COMPARISON:  05/10/2023, 04/03/2019 FINDINGS: Large right middle lobe mass again noted. No effusion or significant pneumothorax following biopsy by plain radiography. Background emphysema noted. Mid and lower lung parenchymal scarring evident. Stable heart size and vascularity. No edema. No new collapse or consolidation. IMPRESSION: Large right middle lobe mass, recently biopsied. No complicating feature by plain radiography. Electronically Signed   By: Judie Petit.  Shick M.D.   On: 05/10/2023 11:53   CT LUNG MASS BIOPSY  Result Date: 05/10/2023 INDICATION: Large anterior right middle lobe mass EXAM: CT-GUIDED BIOPSY CORE BIOPSY LARGE RIGHT MIDDLE LOBE MASS MEDICATIONS: 1%  LIDOCAINE LOCAL ANESTHESIA/SEDATION: 0 mg IV Versed; 25 mcg IV Fentanyl Moderate Sedation Time:  NONE. The patient was continuously monitored during the procedure by the interventional radiology nurse under my direct supervision. PROCEDURE: The procedure, risks, benefits, and alternatives were explained to the patient. Questions regarding the procedure were encouraged and answered. The patient understands and consents to the procedure. Previous imaging reviewed. Patient positioned supine. Noncontrast localization CT performed. The anterior large right middle lobe mass abutting the anterior chest wall was localized and marked for biopsy. Under sterile conditions and local anesthesia, the 17 gauge core biopsy needle was advanced from an anterior intercostal approach to  the lesion. Needle position confirmed with CT. 2 18 gauge core biopsies obtained. Samples were intact and non fragmented. These were placed in formalin. Needle tract occluded with the bio sentry device. Postprocedure imaging demonstrates no complicating feature. Stable emphysema and adjacent bullous disease. Patient tolerated the procedure well without complication. Vital sign monitoring by nursing staff during the procedure will continue as patient is in the special procedures unit for post procedure observation. FINDINGS: The images document guide needle placement within the large anterior right middle lobe mass. Post biopsy images demonstrate no hemorrhage, hematoma, large effusion, or pneumothorax. COMPLICATIONS: None immediate. IMPRESSION: Successful CT-guided core biopsy of the large anterior right middle lobe mass RADIATION DOSE REDUCTION: This exam was performed according to the departmental dose-optimization program which includes automated exposure control, adjustment of the mA and/or kV according to patient size and/or use of iterative reconstruction technique. Electronically Signed   By: Judie Petit.  Shick M.D.   On: 05/10/2023 11:51     Labs:  CBC: Recent Labs    05/10/23 0922 05/10/23 1244 05/11/23 0257 05/13/23 0514  WBC 8.7 9.8 9.6 9.6  HGB 10.1* 10.2* 9.1* 9.1*  HCT 31.6* 32.1* 28.0* 28.0*  PLT 300 313 297 355    COAGS: Recent Labs    05/10/23 0922  INR 1.1    BMP: Recent Labs    04/04/23 0537 05/10/23 1244 05/11/23 0257 05/13/23 0514  NA 129* 129* 130* 132*  K 3.7 4.7 4.1 3.9  CL 89* 92* 92* 90*  CO2 28 27 29 31   GLUCOSE 136* 135* 115* 112*  BUN 15 12 18 19   CALCIUM 8.9 9.2 8.8* 9.1  CREATININE 0.72 0.70 0.92 0.68  GFRNONAA >60 >60 >60 >60    LIVER FUNCTION TESTS: Recent Labs    06/15/22 1219 12/07/22 1019 05/10/23 1244 05/11/23 0257  BILITOT 0.9 0.6 1.0 0.6  AST 28 28 33 20  ALT 23 22 19 18   ALKPHOS 39 68 64 63  PROT 7.4 7.4 7.8 6.9  ALBUMIN 4.6 4.5 4.4 4.1    TUMOR MARKERS: No results for input(s): "AFPTM", "CEA", "CA199", "CHROMGRNA" in the last 8760 hours.  Assessment and Plan:  Austin Patterson is a 67 yo male being seen today in relation to right lung cancer. Patient is under the care of Dr Alena Bills from [facility] and will be undergoing chemotherapy to treat his right lung cancer. Patient has been referred to IR for image-guided port placement to facilitate chemotherapy. Case has been reviewed with Dr Juliette Alcide. Case scheduled to proceed on 05/26/23. Patient presents today in their usual state of health and is NPO since midnight.  Risks and benefits of image guided port-a-catheter placement was discussed with the patient including, but not limited to bleeding, infection, pneumothorax, or fibrin sheath development and need for additional procedures.  All of the patient's questions were answered, patient is agreeable to proceed. Consent signed and in chart.   Thank you for this interesting consult.  I greatly enjoyed meeting DHARIUS BILLY and look forward to participating in their care.  A copy of this report was sent to the requesting provider on this  date.  Electronically Signed: Kennieth Francois, PA-C 05/26/2023, 9:54 AM   I spent a total of  15 Minutes in face to face in clinical consultation, greater than 50% of which was counseling/coordinating care for right lung cancer.

## 2023-05-26 NOTE — Discharge Instructions (Signed)
Implanted Port Home Guide  An implanted port is a type of central line that is placed under the skin. Central lines are used to provide IV access when treatment or nutrition needs to be given through a person's veins. Implanted ports are used for long-term IV access. An implanted port may be placed because: You need IV medicine that would be irritating to the small veins in your hands or arms. You need long-term IV medicines, such as antibiotics. You need IV nutrition for a long period. You need frequent blood draws for lab tests. You need dialysis.   Implanted ports are usually placed in the chest area, but they can also be placed in the upper arm, the abdomen, or the leg. An implanted port has two main parts: Reservoir. The reservoir is round and will appear as a small, raised area under your skin. The reservoir is the part where a needle is inserted to give medicines or draw blood. Catheter. The catheter is a thin, flexible tube that extends from the reservoir. The catheter is placed into a large vein. Medicine that is inserted into the reservoir goes into the catheter and then into the vein.   How will I care for my incision  You may shower tomorrow Please remove dressing in 24hrs not other skin care is needed  How is my port accessed? Special steps must be taken to access the port: Before the port is accessed, a numbing cream can be placed on the skin. This helps numb the skin over the port site. Your health care provider uses a sterile technique to access the port. Your health care provider must put on a mask and sterile gloves. The skin over your port is cleaned carefully with an antiseptic and allowed to dry. The port is gently pinched between sterile gloves, and a needle is inserted into the port. Only "non-coring" port needles should be used to access the port. Once the port is accessed, a blood return should be checked. This helps ensure that the port is in the vein and is not  clogged. If your port needs to remain accessed for a constant infusion, a clear (transparent) bandage will be placed over the needle site. The bandage and needle will need to be changed every week, or as directed by your health care provider.   What is flushing? Flushing helps keep the port from getting clogged. Follow your health care provider's instructions on how and when to flush the port. Ports are usually flushed with saline solution or a medicine called heparin. The need for flushing will depend on how the port is used. If the port is used for intermittent medicines or blood draws, the port will need to be flushed: After medicines have been given. After blood has been drawn. As part of routine maintenance. If a constant infusion is running, the port may not need to be flushed.   How long will my port stay implanted? The port can stay in for as long as your health care provider thinks it is needed. When it is time for the port to come out, surgery will be done to remove it. The procedure is similar to the one performed when the port was put in. When should I seek immediate medical care? When you have an implanted port, you should seek immediate medical care if: You notice a bad smell coming from the incision site. You have swelling, redness, or drainage at the incision site. You have more swelling or pain at the   port site or the surrounding area. You have a fever that is not controlled with medicine.   This information is not intended to replace advice given to you by your health care provider. Make sure you discuss any questions you have with your health care provider. Document Released: 10/05/2005 Document Revised: 03/12/2016 Document Reviewed: 06/12/2013 Elsevier Interactive Patient Education  2017 [employer]   

## 2023-05-26 NOTE — Procedures (Signed)
Interventional Radiology Procedure Note  Date of Procedure: 05/26/2023  Procedure: Port placement   Findings:  1. Port placement right chest    Complications: No immediate complications noted.   Estimated Blood Loss: minimal  Follow-up and Recommendations: 1. Ready for use    Olive Bass, MD  Vascular & Interventional Radiology  05/26/2023 11:53 AM

## 2023-05-28 ENCOUNTER — Inpatient Hospital Stay: Payer: Medicare HMO | Admitting: Hospice and Palliative Medicine

## 2023-05-28 ENCOUNTER — Inpatient Hospital Stay: Payer: Medicare HMO | Attending: Internal Medicine

## 2023-05-28 ENCOUNTER — Other Ambulatory Visit: Payer: Self-pay | Admitting: *Deleted

## 2023-05-28 ENCOUNTER — Telehealth: Payer: Self-pay

## 2023-05-28 ENCOUNTER — Encounter: Payer: Self-pay | Admitting: Internal Medicine

## 2023-05-28 DIAGNOSIS — C342 Malignant neoplasm of middle lobe, bronchus or lung: Secondary | ICD-10-CM | POA: Insufficient documentation

## 2023-05-28 DIAGNOSIS — C781 Secondary malignant neoplasm of mediastinum: Secondary | ICD-10-CM | POA: Insufficient documentation

## 2023-05-28 DIAGNOSIS — I10 Essential (primary) hypertension: Secondary | ICD-10-CM | POA: Insufficient documentation

## 2023-05-28 DIAGNOSIS — E785 Hyperlipidemia, unspecified: Secondary | ICD-10-CM | POA: Insufficient documentation

## 2023-05-28 DIAGNOSIS — Z79899 Other long term (current) drug therapy: Secondary | ICD-10-CM | POA: Insufficient documentation

## 2023-05-28 DIAGNOSIS — C3491 Malignant neoplasm of unspecified part of right bronchus or lung: Secondary | ICD-10-CM

## 2023-05-28 DIAGNOSIS — Z7982 Long term (current) use of aspirin: Secondary | ICD-10-CM | POA: Insufficient documentation

## 2023-05-28 DIAGNOSIS — E871 Hypo-osmolality and hyponatremia: Secondary | ICD-10-CM | POA: Insufficient documentation

## 2023-05-28 DIAGNOSIS — Z7902 Long term (current) use of antithrombotics/antiplatelets: Secondary | ICD-10-CM | POA: Insufficient documentation

## 2023-05-28 DIAGNOSIS — Z5189 Encounter for other specified aftercare: Secondary | ICD-10-CM | POA: Insufficient documentation

## 2023-05-28 DIAGNOSIS — Z9981 Dependence on supplemental oxygen: Secondary | ICD-10-CM | POA: Insufficient documentation

## 2023-05-28 DIAGNOSIS — Z5112 Encounter for antineoplastic immunotherapy: Secondary | ICD-10-CM | POA: Insufficient documentation

## 2023-05-28 DIAGNOSIS — D649 Anemia, unspecified: Secondary | ICD-10-CM | POA: Insufficient documentation

## 2023-05-28 DIAGNOSIS — Z87891 Personal history of nicotine dependence: Secondary | ICD-10-CM | POA: Insufficient documentation

## 2023-05-28 DIAGNOSIS — Z5111 Encounter for antineoplastic chemotherapy: Secondary | ICD-10-CM | POA: Insufficient documentation

## 2023-05-28 MED ORDER — DEXAMETHASONE 4 MG PO TABS
ORAL_TABLET | ORAL | 1 refills | Status: DC
Start: 2023-05-28 — End: 2023-08-08

## 2023-05-28 MED ORDER — PROCHLORPERAZINE MALEATE 10 MG PO TABS: 10.0000 mg | ORAL_TABLET | Freq: Four times a day (QID) | ORAL | 1 refills | Status: DC | PRN

## 2023-05-28 MED ORDER — ONDANSETRON HCL 8 MG PO TABS: 8.0000 mg | ORAL_TABLET | Freq: Three times a day (TID) | ORAL | 1 refills | Status: DC | PRN

## 2023-05-28 MED ORDER — LIDOCAINE-PRILOCAINE 2.5-2.5 % EX CREA
TOPICAL_CREAM | CUTANEOUS | 3 refills | Status: AC
Start: 2023-05-28 — End: ?

## 2023-05-28 NOTE — Telephone Encounter (Signed)
Saw unfortunately insurance will not allow to DME companies to provide oxygen.  His best bet would be to check on something like [employer].

## 2023-05-28 NOTE — Progress Notes (Signed)
Pt called in to get take home medications sent into [facility] in [place]. Prescriptions released from treatment plan and sent into pharmacy. Pt aware that can pick up this weekend.

## 2023-05-28 NOTE — Telephone Encounter (Addendum)
Patient called in.  He would like order for POC to be placed to [facility]. He does not want order to go through [facility], as he is concerned that they will remove all of his oxygen from his home.  Advised him to contact [employer], however he would like to try through a DME company.   Dr. Jayme Cloud, please advise. Thanks

## 2023-05-28 NOTE — Telephone Encounter (Signed)
Spoke to patient and relayed below message/recommendations. He stated that he would check with [employer]. He will call back on Monday for their number, as he is currently outside and unable to write the number down. [employer] contact number is [phone] according to google. Nothing further needed.

## 2023-05-31 ENCOUNTER — Encounter: Payer: Self-pay | Admitting: Internal Medicine

## 2023-05-31 ENCOUNTER — Inpatient Hospital Stay (HOSPITAL_BASED_OUTPATIENT_CLINIC_OR_DEPARTMENT_OTHER): Payer: Medicare HMO | Admitting: Internal Medicine

## 2023-05-31 ENCOUNTER — Encounter: Payer: Self-pay | Admitting: *Deleted

## 2023-05-31 VITALS — BP 116/61 | HR 77 | Temp 97.8°F | Wt 203.0 lb

## 2023-05-31 DIAGNOSIS — Z5111 Encounter for antineoplastic chemotherapy: Secondary | ICD-10-CM

## 2023-05-31 DIAGNOSIS — Z5112 Encounter for antineoplastic immunotherapy: Secondary | ICD-10-CM | POA: Diagnosis not present

## 2023-05-31 DIAGNOSIS — Z79899 Other long term (current) drug therapy: Secondary | ICD-10-CM | POA: Diagnosis not present

## 2023-05-31 DIAGNOSIS — I10 Essential (primary) hypertension: Secondary | ICD-10-CM | POA: Diagnosis not present

## 2023-05-31 DIAGNOSIS — Z7982 Long term (current) use of aspirin: Secondary | ICD-10-CM | POA: Diagnosis not present

## 2023-05-31 DIAGNOSIS — D649 Anemia, unspecified: Secondary | ICD-10-CM

## 2023-05-31 DIAGNOSIS — C3491 Malignant neoplasm of unspecified part of right bronchus or lung: Secondary | ICD-10-CM

## 2023-05-31 DIAGNOSIS — C342 Malignant neoplasm of middle lobe, bronchus or lung: Secondary | ICD-10-CM | POA: Diagnosis present

## 2023-05-31 DIAGNOSIS — Z5189 Encounter for other specified aftercare: Secondary | ICD-10-CM | POA: Diagnosis not present

## 2023-05-31 DIAGNOSIS — Z7902 Long term (current) use of antithrombotics/antiplatelets: Secondary | ICD-10-CM | POA: Diagnosis not present

## 2023-05-31 DIAGNOSIS — Z87891 Personal history of nicotine dependence: Secondary | ICD-10-CM | POA: Diagnosis not present

## 2023-05-31 DIAGNOSIS — Z9981 Dependence on supplemental oxygen: Secondary | ICD-10-CM | POA: Diagnosis not present

## 2023-05-31 DIAGNOSIS — C781 Secondary malignant neoplasm of mediastinum: Secondary | ICD-10-CM | POA: Diagnosis not present

## 2023-05-31 DIAGNOSIS — E871 Hypo-osmolality and hyponatremia: Secondary | ICD-10-CM | POA: Diagnosis not present

## 2023-05-31 DIAGNOSIS — E785 Hyperlipidemia, unspecified: Secondary | ICD-10-CM | POA: Diagnosis not present

## 2023-05-31 MED FILL — Fosaprepitant Dimeglumine For IV Infusion 150 MG (Base Eq): INTRAVENOUS | Qty: 5 | Status: AC

## 2023-05-31 MED FILL — Dexamethasone Sodium Phosphate Inj 100 MG/10ML: INTRAMUSCULAR | Qty: 1 | Status: AC

## 2023-05-31 NOTE — Progress Notes (Unsigned)
Patient's son and his son's wife are here today to talk to Dr. Alena Bills about next steps with the patients treatment, and about getting help at home since patient lives alone. They also have some hospice questions.

## 2023-05-31 NOTE — Progress Notes (Signed)
Met with patient and his family during follow up visit with Dr. Alena Bills. All questions answered during visit. Reviewed upcoming appts. Contact info given to pt's son and daughter. Instructed to call with any questions or needs. Pt and family verbalized understanding.

## 2023-06-01 ENCOUNTER — Encounter: Payer: Self-pay | Admitting: Internal Medicine

## 2023-06-01 ENCOUNTER — Inpatient Hospital Stay: Payer: Medicare HMO

## 2023-06-01 ENCOUNTER — Inpatient Hospital Stay (HOSPITAL_BASED_OUTPATIENT_CLINIC_OR_DEPARTMENT_OTHER): Payer: Medicare HMO | Admitting: Hospice and Palliative Medicine

## 2023-06-01 ENCOUNTER — Encounter: Payer: Self-pay | Admitting: *Deleted

## 2023-06-01 VITALS — BP 151/73 | HR 72 | Temp 97.1°F | Resp 18

## 2023-06-01 DIAGNOSIS — Z515 Encounter for palliative care: Secondary | ICD-10-CM | POA: Diagnosis not present

## 2023-06-01 DIAGNOSIS — C3491 Malignant neoplasm of unspecified part of right bronchus or lung: Secondary | ICD-10-CM

## 2023-06-01 DIAGNOSIS — Z5112 Encounter for antineoplastic immunotherapy: Secondary | ICD-10-CM | POA: Diagnosis not present

## 2023-06-01 DIAGNOSIS — E876 Hypokalemia: Secondary | ICD-10-CM

## 2023-06-01 DIAGNOSIS — Z5111 Encounter for antineoplastic chemotherapy: Secondary | ICD-10-CM | POA: Insufficient documentation

## 2023-06-01 LAB — CMP (CANCER CENTER ONLY)
ALT: 20 U/L (ref 0–44)
AST: 28 U/L (ref 15–41)
Albumin: 3.8 g/dL (ref 3.5–5.0)
Alkaline Phosphatase: 82 U/L (ref 38–126)
Anion gap: 10 (ref 5–15)
BUN: 18 mg/dL (ref 8–23)
CO2: 25 mmol/L (ref 22–32)
Calcium: 8.7 mg/dL — ABNORMAL LOW (ref 8.9–10.3)
Chloride: 88 mmol/L — ABNORMAL LOW (ref 98–111)
Creatinine: 0.87 mg/dL (ref 0.61–1.24)
GFR, Estimated: >60 mL/min (ref >60)
Glucose, Bld: 178 mg/dL — ABNORMAL HIGH (ref 70–99)
Potassium: 3.4 mmol/L — ABNORMAL LOW (ref 3.5–5.1)
Sodium: 123 mmol/L — ABNORMAL LOW (ref 135–145)
Total Bilirubin: 0.3 mg/dL (ref 0.3–1.2)
Total Protein: 7.1 g/dL (ref 6.5–8.1)

## 2023-06-01 LAB — CBC WITH DIFFERENTIAL (CANCER CENTER ONLY)
Abs Immature Granulocytes: 0.03 10*3/uL (ref 0.00–0.07)
Basophils Absolute: 0 10*3/uL (ref 0.0–0.1)
Basophils Relative: 0 %
Eosinophils Absolute: 0.1 10*3/uL (ref 0.0–0.5)
Eosinophils Relative: 1 %
HCT: 28.7 % — ABNORMAL LOW (ref 39.0–52.0)
Hemoglobin: 9.1 g/dL — ABNORMAL LOW (ref 13.0–17.0)
Immature Granulocytes: 0 %
Lymphocytes Relative: 9 %
Lymphs Abs: 0.7 10*3/uL (ref 0.7–4.0)
MCH: 26.9 pg (ref 26.0–34.0)
MCHC: 31.7 g/dL (ref 30.0–36.0)
MCV: 84.9 fL (ref 80.0–100.0)
Monocytes Absolute: 0.7 10*3/uL (ref 0.1–1.0)
Monocytes Relative: 9 %
Neutro Abs: 6.1 10*3/uL (ref 1.7–7.7)
Neutrophils Relative %: 81 %
Platelet Count: 340 10*3/uL (ref 150–400)
RBC: 3.38 MIL/uL — ABNORMAL LOW (ref 4.22–5.81)
RDW: 16.2 % — ABNORMAL HIGH (ref 11.5–15.5)
WBC Count: 7.6 10*3/uL (ref 4.0–10.5)
nRBC: 0 % (ref 0.0–0.2)

## 2023-06-01 LAB — TSH: TSH: 1.183 u[IU]/mL (ref 0.350–4.500)

## 2023-06-01 MED ORDER — SODIUM CHLORIDE 0.9 % IV SOLN
473.6000 mg | Freq: Once | INTRAVENOUS | Status: AC
  Administered 2023-06-01: 470 mg via INTRAVENOUS
  Filled 2023-06-01: qty 47

## 2023-06-01 MED ORDER — SODIUM CHLORIDE 0.9 % IV SOLN
10.0000 mg | Freq: Once | INTRAVENOUS | Status: AC
  Administered 2023-06-01: 10 mg via INTRAVENOUS
  Filled 2023-06-01: qty 10

## 2023-06-01 MED ORDER — SODIUM CHLORIDE 0.9 % IV SOLN
125.0000 mg/m2 | Freq: Once | INTRAVENOUS | Status: AC
  Administered 2023-06-01: 258 mg via INTRAVENOUS
  Filled 2023-06-01: qty 43

## 2023-06-01 MED ORDER — SODIUM CHLORIDE 0.9% FLUSH
10.0000 mL | INTRAVENOUS | Status: DC | PRN
  Administered 2023-06-01: 10 mL
  Filled 2023-06-01: qty 10

## 2023-06-01 MED ORDER — GUAIFENESIN-DM 100-10 MG/5ML PO SYRP: 5.0000 mL | ORAL_SOLUTION | ORAL | 0 refills | Status: DC | PRN

## 2023-06-01 MED ORDER — POTASSIUM CHLORIDE IN NACL 20-0.9 MEQ/L-% IV SOLN
INTRAVENOUS | Status: AC
  Filled 2023-06-01: qty 1000

## 2023-06-01 MED ORDER — SODIUM CHLORIDE 0.9 % IV SOLN
150.0000 mg | Freq: Once | INTRAVENOUS | Status: AC
  Administered 2023-06-01: 150 mg via INTRAVENOUS
  Filled 2023-06-01: qty 150

## 2023-06-01 MED ORDER — PALONOSETRON HCL INJECTION 0.25 MG/5ML
0.2500 mg | Freq: Once | INTRAVENOUS | Status: AC
  Administered 2023-06-01: 0.25 mg via INTRAVENOUS
  Filled 2023-06-01: qty 5

## 2023-06-01 MED ORDER — SODIUM CHLORIDE 0.9 % IV SOLN
Freq: Once | INTRAVENOUS | Status: AC
  Filled 2023-06-01: qty 250

## 2023-06-01 MED ORDER — HEPARIN SOD (PORK) LOCK FLUSH 100 UNIT/ML IV SOLN
500.0000 [IU] | Freq: Once | INTRAVENOUS | Status: AC | PRN
  Administered 2023-06-01: 500 [IU]
  Filled 2023-06-01: qty 5

## 2023-06-01 MED ORDER — SODIUM CHLORIDE 0.9 % IV SOLN
200.0000 mg | Freq: Once | INTRAVENOUS | Status: AC
  Administered 2023-06-01: 200 mg via INTRAVENOUS
  Filled 2023-06-01: qty 8

## 2023-06-01 MED ORDER — DIPHENHYDRAMINE HCL 50 MG/ML IJ SOLN
50.0000 mg | Freq: Once | INTRAMUSCULAR | Status: AC
  Administered 2023-06-01: 50 mg via INTRAVENOUS
  Filled 2023-06-01: qty 1

## 2023-06-01 MED ORDER — FAMOTIDINE IN NACL 20-0.9 MG/50ML-% IV SOLN
20.0000 mg | Freq: Once | INTRAVENOUS | Status: AC
  Administered 2023-06-01: 20 mg via INTRAVENOUS
  Filled 2023-06-01: qty 50

## 2023-06-01 NOTE — Progress Notes (Signed)
[facility] CONSULT NOTE  Patient Care Team: Pcp, No as PCP - General Debbe Odea, MD as PCP - Cardiology (Cardiology) Salena Saner, MD as Consulting Physician (Pulmonary Disease) Glory Buff, RN as Oncology Nurse Navigator Michaelyn Barter, MD as Consulting Physician (Oncology)   CANCER STAGING   Cancer Staging  Sarcomatoid carcinoma of lung [facility]) Staging form: Lung, AJCC 8th Edition - Clinical: Stage IV (cT4, cN3, cM1) - Signed by Michaelyn Barter, MD on 05/20/2023 Stage prefix: Initial diagnosis   ASSESSMENT & PLAN:  Austin Patterson 67 y.o. 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  Pathology showed poorly differentiated carcinoma with spindle cell component.  Tumor cells positive for CK and focal TTF-1.  The differential diagnosis includes pleomorphic carcinoma and carcinosarcoma.  Definitive classification cannot be performed on the biopsy specimen.  - PET CT scan from 04/19/2023 showed 7.1 cm right lung mass with SUV of 17.9, bilateral mediastinal and hilar hypermetabolic nodes, hypermetabolic porta hepatis node 1.4 cm SUV 7.2, right lower lobe hypermetabolic pulmonary nodule favoring synchronous primary. Postbiopsy had COPD exacerbation, patient had to be hospitalized.  Repeat CT from 7/22 did show new 0.5 cm left lower lobe nodule.  Also increase in the size of dominant mass from  7.1 x 5.5 to 7.7 x 6 cm.  Also increase in the size of 2 RLL nodules.  MRI brain with and without contrast was negative for metastatic disease.  -Tempus testing showed PD-L1 0% with no targetable mutations.  -Patient seen today accompanied with son and daughter.  We rediscussed about the role of chemotherapy which is palliative in nature.  He is at high risk of side effects and complications due to poor functional status and severe COPD with being oxygen dependent.  At this time, he understands the risk and would like to proceed with the treatment to see how he is tolerating it.  If he does not tolerate well, he plans to consider palliative care/hospice.  We will consider doing repeat scans after 3 cycles.  He will come tomorrow for treatment.  We plan to start on carboplatin AUC 4, Taxol 125 mg/m and Keytruda.  Dose reduction for poor functional status.  Denied blood work today.  We will do it tomorrow.  # Chronic respiratory failure on 2 L oxygen # Asthma and COPD overlap # Idiopathic pulmonary fibrosis -Management per pulmonary.  On Dupixent  # Hyponatremia -Chronic in nature.  Sodium 129 at baseline.  # Normocytic anemia -Baseline hemoglobin between 9-10 since February 2024. -I will add iron panel, B12 and folate with next lab.  Orders Placed This Encounter  Procedures   CBC with Differential (Cancer Center Only)    Standing Status:   Future    Standing Expiration Date:   06/21/2024   CMP (Cancer Center only)    Standing Status:   Future    Standing Expiration Date:   06/21/2024   Folate    Standing Status:   Future    Standing Expiration  Date:   05/19/2024   Vitamin B12    Standing Status:   Future    Standing Expiration Date:   05/19/2024   Ferritin    Standing Status:   Future    Standing Expiration Date:   05/19/2024   Iron and TIBC    Standing Status:   Future    Standing Expiration Date:   05/19/2024   RTC in 1 week for APP visit, labs, fluids RTC in 2-week for MD visit, labs,  fluid 30 seen 3 weeks for MD visit, labs, cycle 2 carbo [employer].  The total time spent in the appointment was 30 minutes encounter with patients including review of chart and various tests results, discussions about plan of care and coordination of care plan   All questions were answered. The patient knows to call the clinic with any problems, questions or concerns. No barriers to learning was detected.  Michaelyn Barter, MD 8/13/20242:31 PM   HISTORY OF PRESENTING ILLNESS:  Austin Patterson 67 y.o. 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  CTA chest showed new 6.8 x 4.9 cm soft tissue mass in the anterior right midlung field.  10 mm nodule in the right lower lung field.  1.8 cm nodule in the right lower lung field.  Large bulla and emphysema present.  Enlarged lymph nodes in the mediastinum measuring 2.1 x 1.9 cm right hilum 2.8 x 1.8 cm.  2.1 cm nodule in right adrenal.  1.8 cm nodule in the left adrenal suggestive of adenomas.  No evidence of PE.    Patient was seen today in the clinic for further evaluation.  He is a remote smoker quit in 2016.  Prior smoked 2 packs a day since age 34.  He is on 2 L oxygen since 2017.  Lives alone.  Was in a wheelchair.  Does not have family close by.  Follows with Dr. Jayme Cloud.   Interval history Patient was seen today as follow-up to discuss the path report. After the lung biopsy, patient had exacerbation of COPD and was admitted.  CT Showed trace right pneumothorax with small right pleural effusion concerning for hemothorax.  Was treated with  conservative management.  His breathing is stable.  On 2 L oxygen which is chronic.  I have reviewed his chart and materials related to his cancer extensively and collaborated history with the patient. Summary of oncologic history is as follows: His breathing is stable Oncology History  Sarcomatoid carcinoma of lung (HCC)  05/18/2023 Initial Diagnosis   Sarcomatoid carcinoma of lung (HCC)   05/20/2023 Cancer Staging   Staging form: Lung, AJCC 8th Edition - Clinical: Stage IV (cT4, cN3, cM1) - Signed by Michaelyn Barter, MD on 05/20/2023 Stage prefix: Initial diagnosis   06/01/2023 -  Chemotherapy   Patient is on Treatment Plan : LUNG NSCLC Carboplatin (6) + Paclitaxel (200) + Pembrolizumab (200) D1 q21d x 4 cycles / Pembrolizumab (200) Maintenance D1 q21d       MEDICAL HISTORY:  Past Medical History:  Diagnosis Date   Abdominal pain 12/07/2022   Acute urinary retention 12/07/2022   Chest pain 01/24/2022   COPD (chronic obstructive pulmonary  disease) (HCC)    GERD (gastroesophageal reflux disease)    Hemothorax on right 05/11/2023   Hyperlipidemia    Hypertension    Pleuritic pain 05/10/2023   Pneumothorax after biopsy 05/11/2023   Post procedure discomfort 05/13/2023   Pulmonary fibrosis (HCC) 11/2015    SURGICAL HISTORY: Past Surgical History:  Procedure Laterality Date   COLONOSCOPY     CORONARY STENT PLACEMENT     ESOPHAGOGASTRODUODENOSCOPY (EGD) WITH PROPOFOL N/A 09/23/2016   Procedure: ESOPHAGOGASTRODUODENOSCOPY (EGD) WITH PROPOFOL;  Surgeon: Wyline Mood, MD;  Location: [facility] ENDOSCOPY;  Service: Endoscopy;  Laterality: N/A;   IR IMAGING GUIDED PORT INSERTION  05/26/2023   KYPHOPLASTY N/A 03/14/2020   Procedure: T7 & T11 KYPHOPLASTY;  Surgeon: Kennedy Bucker, MD;  Location: [facility] ORS;  Service: Orthopedics;  Laterality: N/A;   SHOULDER ACROMIOPLASTY      SOCIAL HISTORY: Social History   Socioeconomic History   Marital status: Legally Separated    Spouse name: Not on file    Number of children: Not on file   Years of education: Not on file   Highest education level: Not on file  Occupational History   Not on file  Tobacco Use   Smoking status: Former    Current packs/day: 0.00    Average packs/day: 2.0 packs/day for 50.0 years (100.0 ttl pk-yrs)    Types: Cigarettes    Start date: 10/13/1965    Quit date: 10/14/2015    Years since quitting: 7.6   Smokeless tobacco: Former  Building services engineer status: Never Used  Substance and Sexual Activity   Alcohol use: Yes    Alcohol/week: 56.0 standard drinks of alcohol    Types: 56 Cans of beer per week    Comment: "I sit around and drink beer, that's all I got to do"   Drug use: No   Sexual activity: Not Currently  Other Topics Concern   Not on file  Social History Narrative   Not on file   Social Determinants of Health   Financial Resource Strain: Not on file  Food Insecurity: No Food Insecurity (04/03/2023)   Hunger Vital Sign    Worried About Running Out of Food in the Last Year: Never true    Ran Out of Food in the Last Year: Never true  Transportation Needs: No Transportation Needs (04/03/2023)   PRAPARE - Administrator, Civil Service (Medical): No    Lack of Transportation (Non-Medical): No  Physical Activity: Not on file  Stress: Not on file  Social Connections: Not on file  Intimate Partner Violence: Not At Risk (04/03/2023)   Humiliation, Afraid, Rape, and Kick questionnaire    Fear of Current or Ex-Partner: No    Emotionally Abused: No    Physically Abused: No    Sexually Abused: No    FAMILY HISTORY: Family History  Problem Relation Age of Onset   Heart disease Mother     ALLERGIES:  is allergic to amoxicillin and tizanidine.  MEDICATIONS:  Current Outpatient Medications  Medication Sig Dispense Refill   acetaminophen (TYLENOL) 500 MG tablet Take 1,000 mg by mouth every 6 (six) hours as needed for moderate pain or headache.     albuterol (VENTOLIN HFA) 108 (90 Base)  MCG/ACT inhaler Inhale 2 puffs into the lungs every 6 (six) hours as needed for wheezing or shortness of breath. 8 g 11   amLODipine (NORVASC) 10 MG tablet Take 1 tablet (10 mg total) by mouth daily. 90 tablet 3  aspirin EC 81 MG tablet Take 1 tablet (81 mg total) by mouth daily. 90 tablet 3   atorvastatin (LIPITOR) 40 MG tablet Take 1 tablet (40 mg total) by mouth at bedtime. 90 tablet 3   CALCIUM 600/VITAMIN D 600-10 MG-MCG TABS Take 1 tablet by mouth 2 (two) times daily.     chlorpheniramine-HYDROcodone (TUSSIONEX) 10-8 MG/5ML Take 5 mLs by mouth at bedtime as needed for cough. 70 mL 0   clopidogrel (PLAVIX) 75 MG tablet Take 1 tablet (75 mg total) by mouth daily. 90 tablet 3   dexamethasone (DECADRON) 4 MG tablet Take 2 tablets (8mg ) by mouth daily starting the day after carboplatin for 3 days. Take with food 30 tablet 1   Dupilumab (DUPIXENT) 300 MG/2ML SOPN Inject 300 mg into the skin every 14 (fourteen) days. 12 mL 1   EPINEPHrine 0.3 mg/0.3 mL IJ SOAJ injection Inject 0.3 mg into the muscle as needed for anaphylaxis.     fluticasone-salmeterol (ADVAIR DISKUS) 250-50 MCG/ACT AEPB Inhale 1 puff into the lungs in the morning and at bedtime. 60 each 11   gabapentin (NEURONTIN) 300 MG capsule Take 1 capsule (300 mg total) by mouth 3 (three) times daily. 90 capsule 1   guaiFENesin-dextromethorphan (ROBITUSSIN DM) 100-10 MG/5ML syrup Take 5 mLs by mouth every 4 (four) hours as needed for cough. 118 mL 0   ipratropium-albuterol (DUONEB) 0.5-2.5 (3) MG/3ML SOLN INHALE 1 VIAL THROUGH NEBULIZER EVERY 6 HOURS 270 mL 2   KLOR-CON M20 20 MEQ tablet Take 20 mEq by mouth daily.     lidocaine-prilocaine (EMLA) cream Apply to affected area once 30 g 3   losartan-hydrochlorothiazide (HYZAAR) 100-25 MG tablet Take 1 tablet by mouth daily.     magnesium oxide (MAG-OX) 400 (241.3 Mg) MG tablet Take 1 tablet (400 mg total) by mouth daily. 30 tablet 0   metoprolol succinate (TOPROL-XL) 50 MG 24 hr tablet Take 1  tablet (50 mg total) by mouth daily. 90 tablet 3   montelukast (SINGULAIR) 10 MG tablet Take 10 mg by mouth daily.     Multiple Vitamin (MULTIVITAMIN WITH MINERALS) TABS tablet Take 1 tablet by mouth daily. 30 tablet 0   ondansetron (ZOFRAN) 8 MG tablet Take 1 tablet (8 mg total) by mouth every 8 (eight) hours as needed for nausea or vomiting. Start on the third day after carboplatin. 30 tablet 1   pantoprazole (PROTONIX) 40 MG tablet Take 1 tablet (40 mg total) by mouth 2 (two) times daily. 90 tablet 3   predniSONE (DELTASONE) 10 MG tablet Take 1 tablet (10 mg total) by mouth daily as needed. Take as directed. 30 tablet 1   prochlorperazine (COMPAZINE) 10 MG tablet Take 1 tablet (10 mg total) by mouth every 6 (six) hours as needed for nausea or vomiting. 30 tablet 1   senna-docusate (SENOKOT-S) 8.6-50 MG tablet Take 1 tablet by mouth 2 (two) times daily. 30 tablet 1   Spacer/Aero-Holding Chambers (AEROCHAMBER MV) inhaler Use as instructed 1 each 0   tamsulosin (FLOMAX) 0.4 MG CAPS capsule Take 1 capsule (0.4 mg total) by mouth daily after supper. 30 capsule 2   No current facility-administered medications for this visit.   Facility-Administered Medications Ordered in Other Visits  Medication Dose Route Frequency Provider Last Rate Last Admin   CARBOplatin (PARAPLATIN) 470 mg in sodium chloride 0.9 % 250 mL chemo infusion  470 mg Intravenous Once Michaelyn Barter, MD       heparin lock flush 100 unit/mL  500 Units  Intracatheter Once PRN Michaelyn Barter, MD       PACLitaxel (TAXOL) 258 mg in sodium chloride 0.9 % 250 mL chemo infusion (> 80mg /m2)  125 mg/m2 (Treatment Plan Recorded) Intravenous Once Michaelyn Barter, MD 98 mL/hr at 06/01/23 1219 Rate Change at 06/01/23 1219   sodium chloride flush (NS) 0.9 % injection 10 mL  10 mL Intracatheter PRN Michaelyn Barter, MD        REVIEW OF SYSTEMS:   Pertinent information mentioned in HPI All other systems were reviewed with the patient and are  negative.  PHYSICAL EXAMINATION: ECOG PERFORMANCE STATUS: 2 - Symptomatic, <50% confined to bed   Vitals:   05/31/23 1331  BP: 116/61  Pulse: 77  Temp: 97.8 F (36.6 C)  SpO2: 97%    Filed Weights   05/31/23 1331  Weight: 203 lb (92.1 kg)     GENERAL:alert, no distress and comfortable SKIN: skin color, texture, turgor are normal, no rashes or significant lesions EYES: normal, conjunctiva are pink and non-injected, sclera clear OROPHARYNX:no exudate, no erythema and lips, buccal mucosa, and tongue normal  NECK: supple, thyroid normal size, non-tender, without nodularity LYMPH:  no palpable lymphadenopathy in the cervical, axillary or inguinal LUNGS: clear to auscultation and percussion with normal breathing effort HEART: regular rate & rhythm and no murmurs and no lower extremity edema ABDOMEN:abdomen soft, non-tender and normal bowel sounds Musculoskeletal:no cyanosis of digits and no clubbing  PSYCH: alert & oriented x 3 with fluent speech NEURO: no focal motor/sensory deficits  LABORATORY DATA:  I have reviewed the data as listed Lab Results  Component Value Date   WBC 7.6 06/01/2023   HGB 9.1 (L) 06/01/2023   HCT 28.7 (L) 06/01/2023   MCV 84.9 06/01/2023   PLT 340 06/01/2023   Recent Labs    05/10/23 1244 05/11/23 0257 05/13/23 0514 06/01/23 0838  NA 129* 130* 132* 123*  K 4.7 4.1 3.9 3.4*  CL 92* 92* 90* 88*  CO2 27 29 31 25   GLUCOSE 135* 115* 112* 178*  BUN 12 18 19 18   CREATININE 0.70 0.92 0.68 0.87  CALCIUM 9.2 8.8* 9.1 8.7*  GFRNONAA >60 >60 >60 >60  PROT 7.8 6.9  --  7.1  ALBUMIN 4.4 4.1  --  3.8  AST 33 20  --  28  ALT 19 18  --  20  ALKPHOS 64 63  --  82  BILITOT 1.0 0.6  --  0.3    RADIOGRAPHIC STUDIES: I have personally reviewed the radiological images as listed and agreed with the findings in the report. IR IMAGING GUIDED PORT INSERTION  Result Date: 05/26/2023 INDICATION: IV access for chemotherapy EXAM: Chest port placement using  ultrasound and fluoroscopic guidance MEDICATIONS: Documented in the EMR ANESTHESIA/SEDATION: Moderate (conscious) sedation was employed during this procedure. A total of Versed 1 mg and Fentanyl 50 mcg was administered intravenously. Moderate Sedation Time: 28 minutes. The patient's level of consciousness and vital signs were monitored continuously by radiology nursing throughout the procedure under my direct supervision. FLUOROSCOPY TIME:  Fluoroscopy Time: 0.4 minutes (2 mGy) COMPLICATIONS: None immediate. PROCEDURE: Informed written consent was obtained from the patient after a thorough discussion of the procedural risks, benefits and alternatives. All questions were addressed. Maximal Sterile Barrier Technique was utilized including caps, mask, sterile gowns, sterile gloves, sterile drape, hand hygiene and skin antiseptic. A timeout was performed prior to the initiation of the procedure. The patient was placed supine on the exam table. The right neck and chest  was prepped and draped in the standard sterile fashion. A preliminary ultrasound of the right neck was performed and demonstrates a patent right internal jugular vein. A permanent ultrasound image was stored in the electronic medical record. The overlying skin was anesthetized with 1% Lidocaine. Using ultrasound guidance, access was obtained into the right internal jugular vein using a 21 gauge micropuncture set. A wire was advanced into the SVC, a short incision was made at the puncture site, and serial dilatation performed. Next, in an ipsilateral infraclavicular location, an incision was made at the site of the subcutaneous reservoir. Blunt dissection was used to open a pocket to contain the reservoir. A subcutaneous tunnel was then created from the port site to the puncture site. A(n) 8 Fr single lumen catheter was advanced through the tunnel. The catheter was attached to the port and this was placed in the subcutaneous pocket. Under fluoroscopic  guidance, a peel away sheath was placed, and the catheter was trimmed to the appropriate length and was advanced into the central veins. The catheter length is 24 cm. The tip of the catheter lies near the superior cavoatrial junction. The port flushes and aspirates appropriately. The port was flushed and locked with heparinized saline. The port pocket was closed in 2 layers using 3-0 and 4-0 Vicryl/absorbable suture. Dermabond was also applied to both incisions. The patient tolerated the procedure well and was transferred to recovery in stable condition. IMPRESSION: Successful placement of a right-sided chest port via the right internal jugular vein. The port is ready for immediate use. Electronically Signed   By: Olive Bass M.D.   On: 05/26/2023 12:10   CT HEAD WO CONTRAST ( )  Result Date: 05/12/2023 CLINICAL DATA:  Initial evaluation for mental status change, unknown cause. EXAM: CT HEAD WITHOUT CONTRAST TECHNIQUE: Contiguous axial images were obtained from the base of the skull through the vertex without intravenous contrast. RADIATION DOSE REDUCTION: This exam was performed according to the departmental dose-optimization program which includes automated exposure control, adjustment of the mA and/or kV according to patient size and/or use of iterative reconstruction technique. COMPARISON:  Comparison made with prior MRI from 04/20/2023. FINDINGS: Brain: Cerebral volume within normal limits. 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 No acute intracranial hemorrhage. No acute large vessel territory infarct. No mass lesion or midline shift. No hydrocephalus or extra-axial fluid collection. Vascular: No abnormal hyperdense vessel. Scattered vascular calcifications noted within the carotid siphons. Skull: Scalp soft tissues demonstrate no acute finding. Calvarium intact. Sinuses/Orbits: Globes and orbital soft tissues within normal limits. Scattered  mucosal thickening present about the ethmoidal air cells and maxillary sinuses. No significant mastoid effusion. Other: None. IMPRESSION: 1. No acute intracranial abnormality. 2. 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 Electronically Signed   By: Rise Mu M.D.   On: 05/12/2023 01:46   DG Chest Port 1 View  Result Date: 05/11/2023 CLINICAL DATA:  Pneumothorax EXAM: PORTABLE CHEST 1 VIEW COMPARISON:  Chest x-ray 05/11/2023.  Chest x-ray 05/11/2023. FINDINGS: The heart size and mediastinal contours are within normal limits. Right middle lobe masslike opacity is unchanged. Large bullae noted in the lateral right lower hemithorax is unchanged. No definite pneumothorax or pleural effusion identified. No new focal lung infiltrate. No acute fractures. IMPRESSION: 1. No definite pneumothorax identified. 2. Right middle lobe masslike opacities unchanged. Electronically Signed   By: Darliss Cheney M.D.   On: 05/11/2023 23:43   CT CHEST WO CONTRAST  Result  Date: 05/11/2023 CLINICAL DATA:  Follow-up pneumothorax EXAM: CT CHEST WITHOUT CONTRAST TECHNIQUE: Multidetector CT imaging of the chest was performed following the standard protocol without IV contrast. RADIATION DOSE REDUCTION: This exam was performed according to the departmental dose-optimization program which includes automated exposure control, adjustment of the mA and/or kV according to patient size and/or use of iterative reconstruction technique. COMPARISON:  CT 05/10/2023, chest x-ray 05/11/2023 FINDINGS: Cardiovascular: Limited evaluation without intravenous contrast. Advanced aortic atherosclerosis. No aneurysm. Coronary vascular disease. Normal cardiac size. No pericardial effusion Mediastinum/Nodes: Midline trachea. No thyroid mass. Enlarged right hilar nodes better seen on prior contrast enhanced examination. Enlarged right precarinal lymph node measuring 2.1 cm, previously 1.8 cm, series 2,  image 51. Esophagus within normal limits. Lungs/Pleura: Emphysema. Large right middle lobe lung mass measuring approximately 7.7 by 6.5 cm on series 4, image 98, stable in the short interval follow-up, prior maximum measurement of 7.7 cm. Very tiny pneumothorax in the right middle lobe anterior to the lung mass, series 4, image 79, not significantly increased in size. Small volume right pleural effusion slightly increased, now measuring closer to water density. Right lower lobe posterior pulmonary nodules are more obscured by pleural fluid and atelectasis. Stable 7 mm medial left lung base pulmonary nodule series 4, image 127. Upper Abdomen: Bilateral adrenal nodules, measuring 2.5 cm on the right with density value of -6.7. Left adrenal nodule with density value of 0.7, this measures 2 cm. Findings would be consistent with benign adenoma. Hyperdensity in the gallbladder could reflect vicarious excretion of contrast. No calcified stones seen on the recent prior CT. Enlarged porta hepatis node measuring 19 mm, previously 17 mm. Musculoskeletal: Post augmentation changes T7 and T11. chronic bilateral scapular fractures IMPRESSION: 1. Very tiny right anterior pneumothorax, not significantly increased in size. 2. Small volume right pleural effusion slightly increased in size, but now measuring closer to water density. Short-term stability of right middle lobe large lung mass measuring up to 7.7 cm. 3. Right lower lobe suspected metastatic pulmonary nodules are more obscured by pleural fluid and atelectasis on this exam. Stable 7 mm medial left lung base pulmonary nodule. 4. Enlarged mediastinal and right hilar nodes as seen on prior contrast enhanced exam, concerning for metastatic disease. 5. Bilateral adrenal nodules consistent with benign adenomas. Aortic Atherosclerosis (ICD10-I70.0) and Emphysema (ICD10-J43.9). Electronically Signed   By: Jasmine Pang M.D.   On: 05/11/2023 17:44   [place] RT LOWER EXTREM LTD SOFT  TISSUE NON VASCULAR  Result Date: 05/11/2023 CLINICAL DATA:  Foot swelling.  Trauma. EXAM: ULTRASOUND right LOWER EXTREMITY LIMITED TECHNIQUE: Ultrasound examination of the lower extremity soft tissues was performed in the area of clinical concern. COMPARISON:  Foot x-ray 05/11/2023 FINDINGS: Specific ultrasound in the area of concern about the dorsal aspect of the foot demonstrates oblong heterogeneous area which is hypoechoic measuring 3.5 x 1.0 by 4.2 cm. No specific blood flow on Doppler. Margins are somewhat ill-defined. This corresponds to the area of thickening on x-ray. IMPRESSION: 4.2 cm lobular hypoechoic area along the dorsal aspect of the right foot. Possibilities with the patient's history would include hematoma but there is a differential by ultrasound. Recommend either follow-up to confirm resolution and exclude underlying lesion versus additional workup with MRI as clinically appropriate. Electronically Signed   By: Karen Kays M.D.   On: 05/11/2023 16:12   DG Foot Complete Right  Result Date: 05/11/2023 CLINICAL DATA:  Foot swelling EXAM: RIGHT FOOT COMPLETE - 3+ VIEW COMPARISON:  None Available. FINDINGS: No  acute displaced fracture or malalignment. Possible remote fracture involving the fifth proximal phalanx. Mild degenerative change at the first MTP joint. Prominent dorsal soft tissue swelling IMPRESSION: Prominent dorsal soft tissue swelling. No acute osseous abnormality. Electronically Signed   By: Jasmine Pang M.D.   On: 05/11/2023 15:47   DG Chest 2 View  Result Date: 05/11/2023 CLINICAL DATA:  Follow-up pneumothorax EXAM: CHEST - 2 VIEW COMPARISON:  CT 05/10/2023 FINDINGS: Emphysema. Large bulla in the right lower lung. Small right-sided pleural effusion. Atelectasis or scarring in the left lower lung. Large right middle lobe lung mass. No visible pneumothorax. Right scapular fracture with callus. IMPRESSION: 1. No visible pneumothorax. 2. Large right middle lobe lung mass. 3.  Small right pleural effusion. 4. Emphysema. Electronically Signed   By: Jasmine Pang M.D.   On: 05/11/2023 15:43   CT CHEST ABDOMEN PELVIS W CONTRAST  Result Date: 05/10/2023 CLINICAL DATA:  Chest pain and abdominal pain and shortness of breath following lung biopsy EXAM: CT CHEST, ABDOMEN, AND PELVIS WITH CONTRAST TECHNIQUE: Multidetector CT imaging of the chest, abdomen and pelvis was performed following the standard protocol during bolus administration of intravenous contrast. RADIATION DOSE REDUCTION: This exam was performed according to the departmental dose-optimization program which includes automated exposure control, adjustment of the mA and/or kV according to patient size and/or use of iterative reconstruction technique. CONTRAST:  OMNIPAQUE IOHEXOL 300 MG/ML  SOLN COMPARISON:  PET-CT 04/19/2023, CT 04/03/2023 FINDINGS: CT CHEST FINDINGS Cardiovascular: Heart size within normal limits. No pericardial effusion. Thoracic aorta is nonaneurysmal. Extensive atherosclerotic calcifications of the aorta and coronary arteries. Central pulmonary vasculature is within normal limits. Mediastinum/Nodes: Pathologically enlarged mediastinal and right hilar lymph nodes including reference precarinal node measuring 1.8 cm short axis (previously 1.7 cm) and 1.5 cm right hilar node. No axillary or left hilar lymphadenopathy. Lungs/Pleura: Small right pleural effusion with internal density of 40 HU, new since 04/19/2023, which could be related to malignant effusion versus small hemothorax in the setting of recent lung biopsy. Trace right pneumothorax anteriorly adjacent to the large right lung mass (series 4, image 89). Right middle lobe lung mass has slightly increased in size since 04/19/2023, now measuring 7.7 x 6.0 cm transaxially (previously 7.1 x 5.5 cm). Continued enlargement of 2 right lower lobe nodules measuring 2.2 cm and 1.3 cm (previously 1.8 cm and 1.0 cm). New 0.7 cm left lower lobe nodule (series 4,  image 128). Background of advanced emphysema. Large bulla in the right middle lobe. Musculoskeletal: Prior cement augmentation of the T6 and T10 vertebral bodies. Chronic bilateral scapular fractures. No discrete lytic or sclerotic bone lesion. No chest wall hematoma. CT ABDOMEN PELVIS FINDINGS Hepatobiliary: Subcentimeter low-density lesion within the right hepatic lobe is too small to characterize (series 2, image 54). Liver appears otherwise unremarkable. Unremarkable gallbladder. No hyperdense gallstone. No biliary dilatation. Pancreas: Unremarkable. No pancreatic ductal dilatation or surrounding inflammatory changes. Spleen: Normal in size without focal abnormality. Adrenals/Urinary Tract: Stable benign adrenal adenomas which do not require follow-up imaging. Areas of cortical scarring of both kidneys. No renal stone or hydronephrosis. Urinary bladder within normal limits. Stomach/Bowel: Stomach is within normal limits. Scattered colonic diverticulosis. No evidence of bowel wall thickening, distention, or inflammatory changes. Vascular/Lymphatic: Aortic atherosclerosis. Enlarged lymph node in the porta hepatis measuring 1.6 cm. No enlarged pelvic lymph nodes. Reproductive: Prostate is unremarkable. Other: No free fluid. No abdominopelvic fluid collection. No pneumoperitoneum. Musculoskeletal: No acute or significant osseous findings. IMPRESSION: 1. Trace right pneumothorax anteriorly adjacent to  the large right lung mass. 2. Small intermediate-high density right pleural effusion, new since 04/19/2023, which could be related to malignant effusion versus small hemothorax in the setting of recent lung biopsy. 3. Right middle lobe lung mass has slightly increased in size since 04/19/2023, now measuring 7.7 x 6.0 cm transaxially (previously 7.1 x 5.5 cm). 4. Continued enlargement of two right lower lobe nodules measuring 2.2 cm and 1.3 cm (previously 1.8 cm and 1.0 cm). New 0.7 cm left lower lobe nodule. Findings  are compatible with progression of metastatic disease. 5. Pathologically enlarged mediastinal, right hilar, and upper abdominal lymph nodes, also compatible with metastatic disease. Aortic Atherosclerosis (ICD10-I70.0) and Emphysema (ICD10-J43.9). Electronically Signed   By: Duanne Guess D.O.   On: 05/10/2023 15:11   DG Chest 2 View  Result Date: 05/10/2023 CLINICAL DATA:  Shortness of breath following biopsy EXAM: CHEST - 2 VIEW COMPARISON:  05/10/2023 FINDINGS: Stable aeration and chronic emphysema change with right lung bullous disease and basilar scarring. Large anterior right middle lobe mass again noted, biopsied earlier today. No enlarging or significant pneumothorax by plain radiography. No developing pleural effusion. No new collapse or consolidation. Trachea midline. Normal heart size and vascularity. Chronic osseous changes and previous vertebral augmentations noted on the lateral view. IMPRESSION: 1. Stable chronic emphysema and right lung bullous disease. 2. Stable large right middle lobe mass 3. No complicating feature by plain radiography. Electronically Signed   By: Judie Petit.  Shick M.D.   On: 05/10/2023 13:29   DG Chest Port 1 View  Result Date: 05/10/2023 CLINICAL DATA:  Large right middle lobe mass status post biopsy EXAM: PORTABLE CHEST 1 VIEW COMPARISON:  05/10/2023, 04/03/2019 FINDINGS: Large right middle lobe mass again noted. No effusion or significant pneumothorax following biopsy by plain radiography. Background emphysema noted. Mid and lower lung parenchymal scarring evident. Stable heart size and vascularity. No edema. No new collapse or consolidation. IMPRESSION: Large right middle lobe mass, recently biopsied. No complicating feature by plain radiography. Electronically Signed   By: Judie Petit.  Shick M.D.   On: 05/10/2023 11:53   CT LUNG MASS BIOPSY  Result Date: 05/10/2023 INDICATION: Large anterior right middle lobe mass EXAM: CT-GUIDED BIOPSY CORE BIOPSY LARGE RIGHT MIDDLE LOBE MASS  MEDICATIONS: 1% LIDOCAINE LOCAL ANESTHESIA/SEDATION: 0 mg IV Versed; 25 mcg IV Fentanyl Moderate Sedation Time:  NONE. The patient was continuously monitored during the procedure by the interventional radiology nurse under my direct supervision. PROCEDURE: The procedure, risks, benefits, and alternatives were explained to the patient. Questions regarding the procedure were encouraged and answered. The patient understands and consents to the procedure. Previous imaging reviewed. Patient positioned supine. Noncontrast localization CT performed. The anterior large right middle lobe mass abutting the anterior chest wall was localized and marked for biopsy. Under sterile conditions and local anesthesia, the 17 gauge core biopsy needle was advanced from an anterior intercostal approach to the lesion. Needle position confirmed with CT. 2 18 gauge core biopsies obtained. Samples were intact and non fragmented. These were placed in formalin. Needle tract occluded with the bio sentry device. Postprocedure imaging demonstrates no complicating feature. Stable emphysema and adjacent bullous disease. Patient tolerated the procedure well without complication. Vital sign monitoring by nursing staff during the procedure will continue as patient is in the special procedures unit for post procedure observation. FINDINGS: The images document guide needle placement within the large anterior right middle lobe mass. Post biopsy images demonstrate no hemorrhage, hematoma, large effusion, or pneumothorax. COMPLICATIONS: None immediate. IMPRESSION: Successful CT-guided  core biopsy of the large anterior right middle lobe mass RADIATION DOSE REDUCTION: This exam was performed according to the departmental dose-optimization program which includes automated exposure control, adjustment of the mA and/or kV according to patient size and/or use of iterative reconstruction technique. Electronically Signed   By: Judie Petit.  Shick M.D.   On: 05/10/2023 11:51

## 2023-06-01 NOTE — Progress Notes (Signed)
Met with pt in the infusion area during his treatment. Pt and son do not have any questions at this time. Pt brought in advance directive to get notarized today. Informed that referral placed for pt to follow up with social worker to address advance directives, transportation needs, and possible facility placement. Nothing further needed. Instructed to call with any questions. Will follow up next week during clinic visit.

## 2023-06-01 NOTE — Patient Instructions (Signed)
[facility]  Discharge Instructions: Thank you for choosing [facility] to provide your oncology and hematology care.  If you have a lab appointment with the Cancer Center, please go directly to the Cancer Center and check in at the registration area.  Wear comfortable clothing and clothing appropriate for easy access to any Portacath or PICC line.   We strive to give you quality time with your provider. You may need to reschedule your appointment if you arrive late (15 or more minutes).  Arriving late affects you and other patients whose appointments are after yours.  Also, if you miss three or more appointments without notifying the office, you may be dismissed from the clinic at the provider's discretion.      For prescription refill requests, have your pharmacy contact our office and allow 72 hours for refills to be completed.    Today you received the following chemotherapy and/or immunotherapy agents- keytruda, taxol, carboplatin      To help prevent nausea and vomiting after your treatment, we encourage you to take your nausea medication as directed.  BELOW ARE SYMPTOMS THAT SHOULD BE REPORTED IMMEDIATELY: *FEVER GREATER THAN 100.4 F (38 C) OR HIGHER *CHILLS OR SWEATING *NAUSEA AND VOMITING THAT IS NOT CONTROLLED WITH YOUR NAUSEA MEDICATION *UNUSUAL SHORTNESS OF BREATH *UNUSUAL BRUISING OR BLEEDING *URINARY PROBLEMS (pain or burning when urinating, or frequent urination) *BOWEL PROBLEMS (unusual diarrhea, constipation, pain near the anus) TENDERNESS IN MOUTH AND THROAT WITH OR WITHOUT PRESENCE OF ULCERS (sore throat, sores in mouth, or a toothache) UNUSUAL RASH, SWELLING OR PAIN  UNUSUAL VAGINAL DISCHARGE OR ITCHING   Items with * indicate a potential emergency and should be followed up as soon as possible or go to the Emergency Department if any problems should occur.  Please show the CHEMOTHERAPY ALERT CARD or IMMUNOTHERAPY ALERT  CARD at check-in to the Emergency Department and triage nurse.  Should you have questions after your visit or need to cancel or reschedule your appointment, please contact [facility]  [phone] and follow the prompts.  Office hours are 8:00 a.m. to 4:30 p.m. Monday - Friday. Please note that voicemails left after 4:00 p.m. may not be returned until the following business day.  We are closed weekends and major holidays. You have access to a nurse at all times for urgent questions. Please call the main number to the clinic [phone] and follow the prompts.  For any non-urgent questions, you may also contact your provider using MyChart. We now offer e-Visits for anyone 49 and older to request care online for non-urgent symptoms. For details visit [email or web address].   Also download the MyChart app! Go to the app store, search "MyChart", open the app, select [facility], and log in with your MyChart username and password.  Pembrolizumab Injection What is this medication? PEMBROLIZUMAB (PEM broe LIZ ue mab) treats some types of cancer. It works by helping your immune system slow or stop the spread of cancer cells. It is a monoclonal antibody. This medicine may be used for other purposes; ask your health care provider or pharmacist if you have questions. COMMON BRAND NAME(S): Keytruda What should I tell my care team before I take this medication? They need to know if you have any of these conditions: Allogeneic stem cell transplant (uses someone else's stem cells) Autoimmune diseases, such as Crohn disease, ulcerative colitis, lupus History of chest radiation Nervous system problems, such as Guillain-Barre syndrome,  myasthenia gravis Organ transplant An unusual or allergic reaction to pembrolizumab, other medications, foods, dyes, or preservatives Pregnant or trying to get pregnant Breast-feeding How should I use this medication? This medication is  injected into a vein. It is given by your care team in a hospital or clinic setting. A special MedGuide will be given to you before each treatment. Be sure to read this information carefully each time. Talk to your care team about the use of this medication in children. While it may be prescribed for children as young as 6 months for selected conditions, precautions do apply. Overdosage: If you think you have taken too much of this medicine contact a poison control center or emergency room at once. NOTE: This medicine is only for you. Do not share this medicine with others. What if I miss a dose? Keep appointments for follow-up doses. It is important not to miss your dose. Call your care team if you are unable to keep an appointment. What may interact with this medication? Interactions have not been studied. This list may not describe all possible interactions. Give your health care provider a list of all the medicines, herbs, non-prescription drugs, or dietary supplements you use. Also tell them if you smoke, drink alcohol, or use illegal drugs. Some items may interact with your medicine. What should I watch for while using this medication? Your condition will be monitored carefully while you are receiving this medication. You may need blood work while taking this medication. This medication may cause serious skin reactions. They can happen weeks to months after starting the medication. Contact your care team right away if you notice fevers or flu-like symptoms with a rash. The rash may be red or purple and then turn into blisters or peeling of the skin. You may also notice a red rash with swelling of the face, lips, or lymph nodes in your neck or under your arms. Tell your care team right away if you have any change in your eyesight. Talk to your care team if you may be pregnant. Serious birth defects can occur if you take this medication during pregnancy and for 4 months after the last dose. You  will need a negative pregnancy test before starting this medication. Contraception is recommended while taking this medication and for 4 months after the last dose. Your care team can help you find the option that works for you. Do not breastfeed while taking this medication and for 4 months after the last dose. What side effects may I notice from receiving this medication? Side effects that you should report to your care team as soon as possible: Allergic reactions--skin rash, itching, hives, swelling of the face, lips, tongue, or throat Dry cough, shortness of breath or trouble breathing Eye pain, redness, irritation, or discharge with blurry or decreased vision Heart muscle inflammation--unusual weakness or fatigue, shortness of breath, chest pain, fast or irregular heartbeat, dizziness, swelling of the ankles, feet, or hands Hormone gland problems--headache, sensitivity to light, unusual weakness or fatigue, dizziness, fast or irregular heartbeat, increased sensitivity to cold or heat, excessive sweating, constipation, hair loss, increased thirst or amount of urine, tremors or shaking, irritability Infusion reactions--chest pain, shortness of breath or trouble breathing, feeling faint or lightheaded Kidney injury (glomerulonephritis)--decrease in the amount of urine, red or dark brown urine, foamy or bubbly urine, swelling of the ankles, hands, or feet Liver injury--right upper belly pain, loss of appetite, nausea, light-colored stool, dark yellow or brown urine, yellowing skin or eyes,  unusual weakness or fatigue Pain, tingling, or numbness in the hands or feet, muscle weakness, change in vision, confusion or trouble speaking, loss of balance or coordination, trouble walking, seizures Rash, fever, and swollen lymph nodes Redness, blistering, peeling, or loosening of the skin, including inside the mouth Sudden or severe stomach pain, bloody diarrhea, fever, nausea, vomiting Side effects that  usually do not require medical attention (report to your care team if they continue or are bothersome): Bone, joint, or muscle pain Diarrhea Fatigue Loss of appetite Nausea Skin rash This list may not describe all possible side effects. Call your doctor for medical advice about side effects. You may report side effects to FDA at 1-800-FDA-1088. Where should I keep my medication? This medication is given in a hospital or clinic. It will not be stored at home. NOTE: This sheet is a summary. It may not cover all possible information. If you have questions about this medicine, talk to your doctor, pharmacist, or health care provider.  2024 Elsevier/Gold Standard (2022-02-17 00:00:00)  Paclitaxel Injection What is this medication? PACLITAXEL (PAK li TAX el) treats some types of cancer. It works by slowing down the growth of cancer cells. This medicine may be used for other purposes; ask your health care provider or pharmacist if you have questions. COMMON BRAND NAME(S): Onxol, Taxol What should I tell my care team before I take this medication? They need to know if you have any of these conditions: Heart disease Liver disease Low white blood cell levels An unusual or allergic reaction to paclitaxel, other medications, foods, dyes, or preservatives If you or your partner are pregnant or trying to get pregnant Breast-feeding How should I use this medication? This medication is injected into a vein. It is given by your care team in a hospital or clinic setting. Talk to your care team about the use of this medication in children. While it may be given to children for selected conditions, precautions do apply. Overdosage: If you think you have taken too much of this medicine contact a poison control center or emergency room at once. NOTE: This medicine is only for you. Do not share this medicine with others. What if I miss a dose? Keep appointments for follow-up doses. It is important not to  miss your dose. Call your care team if you are unable to keep an appointment. What may interact with this medication? Do not take this medication with any of the following: Live virus vaccines Other medications may affect the way this medication works. Talk with your care team about all of the medications you take. They may suggest changes to your treatment plan to lower the risk of side effects and to make sure your medications work as intended. This list may not describe all possible interactions. Give your health care provider a list of all the medicines, herbs, non-prescription drugs, or dietary supplements you use. Also tell them if you smoke, drink alcohol, or use illegal drugs. Some items may interact with your medicine. What should I watch for while using this medication? Your condition will be monitored carefully while you are receiving this medication. You may need blood work while taking this medication. This medication may make you feel generally unwell. This is not uncommon as chemotherapy can affect healthy cells as well as cancer cells. Report any side effects. Continue your course of treatment even though you feel ill unless your care team tells you to stop. This medication can cause serious allergic reactions. To reduce the  risk, your care team may give you other medications to take before receiving this one. Be sure to follow the directions from your care team. This medication may increase your risk of getting an infection. Call your care team for advice if you get a fever, chills, sore throat, or other symptoms of a cold or flu. Do not treat yourself. Try to avoid being around people who are sick. This medication may increase your risk to bruise or bleed. Call your care team if you notice any unusual bleeding. Be careful brushing or flossing your teeth or using a toothpick because you may get an infection or bleed more easily. If you have any dental work done, tell your dentist you are  receiving this medication. Talk to your care team if you may be pregnant. Serious birth defects can occur if you take this medication during pregnancy. Talk to your care team before breastfeeding. Changes to your treatment plan may be needed. What side effects may I notice from receiving this medication? Side effects that you should report to your care team as soon as possible: Allergic reactions--skin rash, itching, hives, swelling of the face, lips, tongue, or throat Heart rhythm changes--fast or irregular heartbeat, dizziness, feeling faint or lightheaded, chest pain, trouble breathing Increase in blood pressure Infection--fever, chills, cough, sore throat, wounds that don't heal, pain or trouble when passing urine, general feeling of discomfort or being unwell Low blood pressure--dizziness, feeling faint or lightheaded, blurry vision Low red blood cell level--unusual weakness or fatigue, dizziness, headache, trouble breathing Painful swelling, warmth, or redness of the skin, blisters or sores at the infusion site Pain, tingling, or numbness in the hands or feet Slow heartbeat--dizziness, feeling faint or lightheaded, confusion, trouble breathing, unusual weakness or fatigue Unusual bruising or bleeding Side effects that usually do not require medical attention (report to your care team if they continue or are bothersome): Diarrhea Hair loss Joint pain Loss of appetite Muscle pain Nausea Vomiting This list may not describe all possible side effects. Call your doctor for medical advice about side effects. You may report side effects to FDA at 1-800-FDA-1088. Where should I keep my medication? This medication is given in a hospital or clinic. It will not be stored at home. NOTE: This sheet is a summary. It may not cover all possible information. If you have questions about this medicine, talk to your doctor, pharmacist, or health care provider.  2024 Elsevier/Gold Standard (2022-02-24  00:00:00)  Carboplatin Injection What is this medication? CARBOPLATIN (KAR boe pla tin) treats some types of cancer. It works by slowing down the growth of cancer cells. This medicine may be used for other purposes; ask your health care provider or pharmacist if you have questions. COMMON BRAND NAME(S): Paraplatin What should I tell my care team before I take this medication? They need to know if you have any of these conditions: Blood disorders Hearing problems Kidney disease Recent or ongoing radiation therapy An unusual or allergic reaction to carboplatin, cisplatin, other medications, foods, dyes, or preservatives Pregnant or trying to get pregnant Breast-feeding How should I use this medication? This medication is injected into a vein. It is given by your care team in a hospital or clinic setting. Talk to your care team about the use of this medication in children. Special care may be needed. Overdosage: If you think you have taken too much of this medicine contact a poison control center or emergency room at once. NOTE: This medicine is only for you. Do  not share this medicine with others. What if I miss a dose? Keep appointments for follow-up doses. It is important not to miss your dose. Call your care team if you are unable to keep an appointment. What may interact with this medication? Medications for seizures Some antibiotics, such as amikacin, gentamicin, neomycin, streptomycin, tobramycin Vaccines This list may not describe all possible interactions. Give your health care provider a list of all the medicines, herbs, non-prescription drugs, or dietary supplements you use. Also tell them if you smoke, drink alcohol, or use illegal drugs. Some items may interact with your medicine. What should I watch for while using this medication? Your condition will be monitored carefully while you are receiving this medication. You may need blood work while taking this medication. This  medication may make you feel generally unwell. This is not uncommon, as chemotherapy can affect healthy cells as well as cancer cells. Report any side effects. Continue your course of treatment even though you feel ill unless your care team tells you to stop. In some cases, you may be given additional medications to help with side effects. Follow all directions for their use. This medication may increase your risk of getting an infection. Call your care team for advice if you get a fever, chills, sore throat, or other symptoms of a cold or flu. Do not treat yourself. Try to avoid being around people who are sick. Avoid taking medications that contain aspirin, acetaminophen, ibuprofen, naproxen, or ketoprofen unless instructed by your care team. These medications may hide a fever. Be careful brushing or flossing your teeth or using a toothpick because you may get an infection or bleed more easily. If you have any dental work done, tell your dentist you are receiving this medication. Talk to your care team if you wish to become pregnant or think you might be pregnant. This medication can cause serious birth defects. Talk to your care team about effective forms of contraception. Do not breast-feed while taking this medication. What side effects may I notice from receiving this medication? Side effects that you should report to your care team as soon as possible: Allergic reactions--skin rash, itching, hives, swelling of the face, lips, tongue, or throat Infection--fever, chills, cough, sore throat, wounds that don't heal, pain or trouble when passing urine, general feeling of discomfort or being unwell Low red blood cell level--unusual weakness or fatigue, dizziness, headache, trouble breathing Pain, tingling, or numbness in the hands or feet, muscle weakness, change in vision, confusion or trouble speaking, loss of balance or coordination, trouble walking, seizures Unusual bruising or bleeding Side  effects that usually do not require medical attention (report to your care team if they continue or are bothersome): Hair loss Nausea Unusual weakness or fatigue Vomiting This list may not describe all possible side effects. Call your doctor for medical advice about side effects. You may report side effects to FDA at 1-800-FDA-1088. Where should I keep my medication? This medication is given in a hospital or clinic. It will not be stored at home. NOTE: This sheet is a summary. It may not cover all possible information. If you have questions about this medicine, talk to your doctor, pharmacist, or health care provider.  2024 Elsevier/Gold Standard (2022-01-27 00:00:00)

## 2023-06-01 NOTE — Progress Notes (Signed)
Palliative Medicine [facility] at [facility] [phone] [phone]   Name: Austin Patterson Date: 06/01/2023 MRN: [id number]  DOB: 15-May-1956  Patient Care Team: Pcp, No as PCP - General Debbe Odea, MD as PCP - Cardiology (Cardiology) Salena Saner, MD as Consulting Physician (Pulmonary Disease) Glory Buff, RN as Oncology Nurse Navigator Michaelyn Barter, MD as Consulting Physician (Oncology)    REASON FOR CONSULTATION: Austin Patterson is a 67 y.o. 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  Pathology positive for sarcoma component portending a poor prognosis.  Patient was referred to palliative care to address goals and manage ongoing symptoms.   SOCIAL HISTORY:     reports that he quit smoking about 7 years ago. His smoking use included cigarettes. He started smoking about 57 years ago. He has a 100 pack-year smoking history. He has quit using smokeless tobacco. He reports current alcohol use of about 56.0 standard drinks of alcohol per week. He reports that he does not use drugs.  Patient is divorced.  He lives at home alone.  He has a son and daughter, both of whom lives in [place].  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  ADVANCE DIRECTIVES:    CODE STATUS:   PAST MEDICAL HISTORY: Past Medical History:  Diagnosis Date   Abdominal pain 12/07/2022   Acute urinary retention 12/07/2022   Chest pain 01/24/2022   COPD (chronic obstructive pulmonary disease) (HCC)    GERD (gastroesophageal reflux disease)    Hemothorax on right 05/11/2023   Hyperlipidemia    Hypertension    Pleuritic pain 05/10/2023    Pneumothorax after biopsy 05/11/2023   Post procedure discomfort 05/13/2023   Pulmonary fibrosis (HCC) 11/2015    PAST SURGICAL HISTORY:  Past Surgical History:  Procedure Laterality Date   COLONOSCOPY     CORONARY STENT PLACEMENT     ESOPHAGOGASTRODUODENOSCOPY (EGD) WITH PROPOFOL N/A 09/23/2016   Procedure: ESOPHAGOGASTRODUODENOSCOPY (EGD) WITH PROPOFOL;  Surgeon: Wyline Mood, MD;  Location: [facility] ENDOSCOPY;  Service: Endoscopy;  Laterality: N/A;   IR IMAGING GUIDED PORT INSERTION  05/26/2023   KYPHOPLASTY N/A 03/14/2020   Procedure: T7 & T11 KYPHOPLASTY;  Surgeon: Kennedy Bucker, MD;  Location: [facility] ORS;  Service: Orthopedics;  Laterality: N/A;   SHOULDER ACROMIOPLASTY      HEMATOLOGY/ONCOLOGY HISTORY:  Oncology History  Sarcomatoid carcinoma of lung (HCC)  05/18/2023 Initial Diagnosis   Sarcomatoid carcinoma of lung (HCC)   05/20/2023 Cancer Staging   Staging form: Lung, AJCC 8th Edition - Clinical: Stage IV (cT4, cN3, cM1) - Signed by Michaelyn Barter, MD on 05/20/2023 Stage prefix: Initial diagnosis   06/01/2023 -  Chemotherapy   Patient is on Treatment Plan : LUNG NSCLC Carboplatin (6) + Paclitaxel (200) + Pembrolizumab (200) D1 q21d x 4 cycles / Pembrolizumab (200) Maintenance D1 q21d       ALLERGIES:  is allergic to amoxicillin and tizanidine.  MEDICATIONS:  Current Outpatient Medications  Medication Sig Dispense Refill   acetaminophen (TYLENOL) 500 MG tablet Take 1,000 mg by mouth every 6 (six) hours as needed for moderate pain or headache.     albuterol (VENTOLIN HFA) 108 (90 Base) MCG/ACT inhaler Inhale 2 puffs into the lungs every 6 (six) hours  as needed for wheezing or shortness of breath. 8 g 11   amLODipine (NORVASC) 10 MG tablet Take 1 tablet (10 mg total) by mouth daily. 90 tablet 3   aspirin EC 81 MG tablet Take 1 tablet (81 mg total) by mouth daily. 90 tablet 3   atorvastatin (LIPITOR) 40 MG tablet Take 1 tablet (40 mg total) by mouth at bedtime. 90 tablet 3   CALCIUM  600/VITAMIN D 600-10 MG-MCG TABS Take 1 tablet by mouth 2 (two) times daily.     chlorpheniramine-HYDROcodone (TUSSIONEX) 10-8 MG/5ML Take 5 mLs by mouth at bedtime as needed for cough. 70 mL 0   clopidogrel (PLAVIX) 75 MG tablet Take 1 tablet (75 mg total) by mouth daily. 90 tablet 3   dexamethasone (DECADRON) 4 MG tablet Take 2 tablets (8mg ) by mouth daily starting the day after carboplatin for 3 days. Take with food 30 tablet 1   Dupilumab (DUPIXENT) 300 MG/2ML SOPN Inject 300 mg into the skin every 14 (fourteen) days. 12 mL 1   EPINEPHrine 0.3 mg/0.3 mL IJ SOAJ injection Inject 0.3 mg into the muscle as needed for anaphylaxis.     fluticasone-salmeterol (ADVAIR DISKUS) 250-50 MCG/ACT AEPB Inhale 1 puff into the lungs in the morning and at bedtime. 60 each 11   gabapentin (NEURONTIN) 300 MG capsule Take 1 capsule (300 mg total) by mouth 3 (three) times daily. 90 capsule 1   ipratropium-albuterol (DUONEB) 0.5-2.5 (3) MG/3ML SOLN INHALE 1 VIAL THROUGH NEBULIZER EVERY 6 HOURS 270 mL 2   KLOR-CON M20 20 MEQ tablet Take 20 mEq by mouth daily.     lidocaine-prilocaine (EMLA) cream Apply to affected area once 30 g 3   losartan-hydrochlorothiazide (HYZAAR) 100-25 MG tablet Take 1 tablet by mouth daily.     magnesium oxide (MAG-OX) 400 (241.3 Mg) MG tablet Take 1 tablet (400 mg total) by mouth daily. 30 tablet 0   metoprolol succinate (TOPROL-XL) 50 MG 24 hr tablet Take 1 tablet (50 mg total) by mouth daily. 90 tablet 3   montelukast (SINGULAIR) 10 MG tablet Take 10 mg by mouth daily.     Multiple Vitamin (MULTIVITAMIN WITH MINERALS) TABS tablet Take 1 tablet by mouth daily. 30 tablet 0   ondansetron (ZOFRAN) 8 MG tablet Take 1 tablet (8 mg total) by mouth every 8 (eight) hours as needed for nausea or vomiting. Start on the third day after carboplatin. 30 tablet 1   pantoprazole (PROTONIX) 40 MG tablet Take 1 tablet (40 mg total) by mouth 2 (two) times daily. 90 tablet 3   predniSONE (DELTASONE) 10 MG  tablet Take 1 tablet (10 mg total) by mouth daily as needed. Take as directed. 30 tablet 1   prochlorperazine (COMPAZINE) 10 MG tablet Take 1 tablet (10 mg total) by mouth every 6 (six) hours as needed for nausea or vomiting. 30 tablet 1   senna-docusate (SENOKOT-S) 8.6-50 MG tablet Take 1 tablet by mouth 2 (two) times daily. 30 tablet 1   Spacer/Aero-Holding Chambers (AEROCHAMBER MV) inhaler Use as instructed 1 each 0   tamsulosin (FLOMAX) 0.4 MG CAPS capsule Take 1 capsule (0.4 mg total) by mouth daily after supper. 30 capsule 2   No current facility-administered medications for this visit.   Facility-Administered Medications Ordered in Other Visits  Medication Dose Route Frequency Provider Last Rate Last Admin   CARBOplatin (PARAPLATIN) 470 mg in sodium chloride 0.9 % 250 mL chemo infusion  470 mg Intravenous Once Michaelyn Barter, MD  heparin lock flush 100 unit/mL  500 Units Intracatheter Once PRN Michaelyn Barter, MD       PACLitaxel (TAXOL) 258 mg in sodium chloride 0.9 % 250 mL chemo infusion (> 80mg /m2)  125 mg/m2 (Treatment Plan Recorded) Intravenous Once Michaelyn Barter, MD       sodium chloride flush (NS) 0.9 % injection 10 mL  10 mL Intracatheter PRN Michaelyn Barter, MD        VITAL SIGNS: There were no vitals taken for this visit. There were no vitals filed for this visit.  Estimated body mass index is 32.77 kg/m as calculated from the following:   Height as of 05/26/23: 5\' 6"  (1.676 m).   Weight as of 05/31/23: 203 lb (92.1 kg).  LABS: CBC:    Component Value Date/Time   WBC 7.6 06/01/2023 0838   WBC 9.6 05/13/2023 0514   HGB 9.1 (L) 06/01/2023 0838   HGB 12.2 (L) 06/27/2012 0834   HCT 28.7 (L) 06/01/2023 0838   HCT 36.2 (L) 06/27/2012 0834   PLT 340 06/01/2023 0838   PLT 396 06/27/2012 0834   MCV 84.9 06/01/2023 0838   MCV 79 (L) 06/27/2012 0834   NEUTROABS 6.1 06/01/2023 0838   LYMPHSABS 0.7 06/01/2023 0838   MONOABS 0.7 06/01/2023 0838   EOSABS 0.1  06/01/2023 0838   BASOSABS 0.0 06/01/2023 0838   Comprehensive Metabolic Panel:    Component Value Date/Time   NA 123 (L) 06/01/2023 0838   NA 133 (L) 06/27/2012 0834   K 3.4 (L) 06/01/2023 0838   K 4.2 06/27/2012 0834   CL 88 (L) 06/01/2023 0838   CL 102 06/27/2012 0834   CO2 25 06/01/2023 0838   CO2 24 06/27/2012 0834   BUN 18 06/01/2023 0838   BUN 9 06/27/2012 0834   CREATININE 0.87 06/01/2023 0838   CREATININE 0.69 06/27/2012 0834   GLUCOSE 178 (H) 06/01/2023 0838   GLUCOSE 119 (H) 06/27/2012 0834   CALCIUM 8.7 (L) 06/01/2023 0838   CALCIUM 9.2 06/27/2012 0834   AST 28 06/01/2023 0838   ALT 20 06/01/2023 0838   ALKPHOS 82 06/01/2023 0838   BILITOT 0.3 06/01/2023 0838   PROT 7.1 06/01/2023 0838   ALBUMIN 3.8 06/01/2023 0838    RADIOGRAPHIC STUDIES: IR IMAGING GUIDED PORT INSERTION  Result Date: 05/26/2023 INDICATION: IV access for chemotherapy EXAM: Chest port placement using ultrasound and fluoroscopic guidance MEDICATIONS: Documented in the EMR ANESTHESIA/SEDATION: Moderate (conscious) sedation was employed during this procedure. A total of Versed 1 mg and Fentanyl 50 mcg was administered intravenously. Moderate Sedation Time: 28 minutes. The patient's level of consciousness and vital signs were monitored continuously by radiology nursing throughout the procedure under my direct supervision. FLUOROSCOPY TIME:  Fluoroscopy Time: 0.4 minutes (2 mGy) COMPLICATIONS: None immediate. PROCEDURE: Informed written consent was obtained from the patient after a thorough discussion of the procedural risks, benefits and alternatives. All questions were addressed. Maximal Sterile Barrier Technique was utilized including caps, mask, sterile gowns, sterile gloves, sterile drape, hand hygiene and skin antiseptic. A timeout was performed prior to the initiation of the procedure. The patient was placed supine on the exam table. The right neck and chest was prepped and draped in the standard sterile  fashion. A preliminary ultrasound of the right neck was performed and demonstrates a patent right internal jugular vein. A permanent ultrasound image was stored in the electronic medical record. The overlying skin was anesthetized with 1% Lidocaine. Using ultrasound guidance, access was obtained into the right internal  jugular vein using a 21 gauge micropuncture set. A wire was advanced into the SVC, a short incision was made at the puncture site, and serial dilatation performed. Next, in an ipsilateral infraclavicular location, an incision was made at the site of the subcutaneous reservoir. Blunt dissection was used to open a pocket to contain the reservoir. A subcutaneous tunnel was then created from the port site to the puncture site. A(n) 8 Fr single lumen catheter was advanced through the tunnel. The catheter was attached to the port and this was placed in the subcutaneous pocket. Under fluoroscopic guidance, a peel away sheath was placed, and the catheter was trimmed to the appropriate length and was advanced into the central veins. The catheter length is 24 cm. The tip of the catheter lies near the superior cavoatrial junction. The port flushes and aspirates appropriately. The port was flushed and locked with heparinized saline. The port pocket was closed in 2 layers using 3-0 and 4-0 Vicryl/absorbable suture. Dermabond was also applied to both incisions. The patient tolerated the procedure well and was transferred to recovery in stable condition. IMPRESSION: Successful placement of a right-sided chest port via the right internal jugular vein. The port is ready for immediate use. Electronically Signed   By: Olive Bass M.D.   On: 05/26/2023 12:10   CT HEAD WO CONTRAST ( )  Result Date: 05/12/2023 CLINICAL DATA:  Initial evaluation for mental status change, unknown cause. EXAM: CT HEAD WITHOUT CONTRAST TECHNIQUE: Contiguous axial images were obtained from the base of the skull through the vertex  without intravenous contrast. RADIATION DOSE REDUCTION: This exam was performed according to the departmental dose-optimization program which includes automated exposure control, adjustment of the mA and/or kV according to patient size and/or use of iterative reconstruction technique. COMPARISON:  Comparison made with prior MRI from 04/20/2023. FINDINGS: Brain: Cerebral volume within normal limits. 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 No acute intracranial hemorrhage. No acute large vessel territory infarct. No mass lesion or midline shift. No hydrocephalus or extra-axial fluid collection. Vascular: No abnormal hyperdense vessel. Scattered vascular calcifications noted within the carotid siphons. Skull: Scalp soft tissues demonstrate no acute finding. Calvarium intact. Sinuses/Orbits: Globes and orbital soft tissues within normal limits. Scattered mucosal thickening present about the ethmoidal air cells and maxillary sinuses. No significant mastoid effusion. Other: None. IMPRESSION: 1. No acute intracranial abnormality. 2. 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 Electronically Signed   By: Rise Mu M.D.   On: 05/12/2023 01:46   DG Chest Port 1 View  Result Date: 05/11/2023 CLINICAL DATA:  Pneumothorax EXAM: PORTABLE CHEST 1 VIEW COMPARISON:  Chest x-ray 05/11/2023.  Chest x-ray 05/11/2023. FINDINGS: The heart size and mediastinal contours are within normal limits. Right middle lobe masslike opacity is unchanged. Large bullae noted in the lateral right lower hemithorax is unchanged. No definite pneumothorax or pleural effusion identified. No new focal lung infiltrate. No acute fractures. IMPRESSION: 1. No definite pneumothorax identified. 2. Right middle lobe masslike opacities unchanged. Electronically Signed   By: Darliss Cheney M.D.   On: 05/11/2023 23:43   CT CHEST WO CONTRAST  Result  Date: 05/11/2023 CLINICAL DATA:  Follow-up pneumothorax EXAM: CT CHEST WITHOUT CONTRAST TECHNIQUE: Multidetector CT imaging of the chest was performed following the standard protocol without IV contrast. RADIATION DOSE REDUCTION: This exam was performed according to the departmental dose-optimization program which includes automated exposure control, adjustment of the mA and/or kV according to patient  size and/or use of iterative reconstruction technique. COMPARISON:  CT 05/10/2023, chest x-ray 05/11/2023 FINDINGS: Cardiovascular: Limited evaluation without intravenous contrast. Advanced aortic atherosclerosis. No aneurysm. Coronary vascular disease. Normal cardiac size. No pericardial effusion Mediastinum/Nodes: Midline trachea. No thyroid mass. Enlarged right hilar nodes better seen on prior contrast enhanced examination. Enlarged right precarinal lymph node measuring 2.1 cm, previously 1.8 cm, series 2, image 51. Esophagus within normal limits. Lungs/Pleura: Emphysema. Large right middle lobe lung mass measuring approximately 7.7 by 6.5 cm on series 4, image 98, stable in the short interval follow-up, prior maximum measurement of 7.7 cm. Very tiny pneumothorax in the right middle lobe anterior to the lung mass, series 4, image 79, not significantly increased in size. Small volume right pleural effusion slightly increased, now measuring closer to water density. Right lower lobe posterior pulmonary nodules are more obscured by pleural fluid and atelectasis. Stable 7 mm medial left lung base pulmonary nodule series 4, image 127. Upper Abdomen: Bilateral adrenal nodules, measuring 2.5 cm on the right with density value of -6.7. Left adrenal nodule with density value of 0.7, this measures 2 cm. Findings would be consistent with benign adenoma. Hyperdensity in the gallbladder could reflect vicarious excretion of contrast. No calcified stones seen on the recent prior CT. Enlarged porta hepatis node measuring 19 mm,  previously 17 mm. Musculoskeletal: Post augmentation changes T7 and T11. chronic bilateral scapular fractures IMPRESSION: 1. Very tiny right anterior pneumothorax, not significantly increased in size. 2. Small volume right pleural effusion slightly increased in size, but now measuring closer to water density. Short-term stability of right middle lobe large lung mass measuring up to 7.7 cm. 3. Right lower lobe suspected metastatic pulmonary nodules are more obscured by pleural fluid and atelectasis on this exam. Stable 7 mm medial left lung base pulmonary nodule. 4. Enlarged mediastinal and right hilar nodes as seen on prior contrast enhanced exam, concerning for metastatic disease. 5. Bilateral adrenal nodules consistent with benign adenomas. Aortic Atherosclerosis (ICD10-I70.0) and Emphysema (ICD10-J43.9). Electronically Signed   By: Jasmine Pang M.D.   On: 05/11/2023 17:44   [place] RT LOWER EXTREM LTD SOFT TISSUE NON VASCULAR  Result Date: 05/11/2023 CLINICAL DATA:  Foot swelling.  Trauma. EXAM: ULTRASOUND right LOWER EXTREMITY LIMITED TECHNIQUE: Ultrasound examination of the lower extremity soft tissues was performed in the area of clinical concern. COMPARISON:  Foot x-ray 05/11/2023 FINDINGS: Specific ultrasound in the area of concern about the dorsal aspect of the foot demonstrates oblong heterogeneous area which is hypoechoic measuring 3.5 x 1.0 by 4.2 cm. No specific blood flow on Doppler. Margins are somewhat ill-defined. This corresponds to the area of thickening on x-ray. IMPRESSION: 4.2 cm lobular hypoechoic area along the dorsal aspect of the right foot. Possibilities with the patient's history would include hematoma but there is a differential by ultrasound. Recommend either follow-up to confirm resolution and exclude underlying lesion versus additional workup with MRI as clinically appropriate. Electronically Signed   By: Karen Kays M.D.   On: 05/11/2023 16:12   DG Foot Complete Right  Result  Date: 05/11/2023 CLINICAL DATA:  Foot swelling EXAM: RIGHT FOOT COMPLETE - 3+ VIEW COMPARISON:  None Available. FINDINGS: No acute displaced fracture or malalignment. Possible remote fracture involving the fifth proximal phalanx. Mild degenerative change at the first MTP joint. Prominent dorsal soft tissue swelling IMPRESSION: Prominent dorsal soft tissue swelling. No acute osseous abnormality. Electronically Signed   By: Jasmine Pang M.D.   On: 05/11/2023 15:47   DG Chest 2  View  Result Date: 05/11/2023 CLINICAL DATA:  Follow-up pneumothorax EXAM: CHEST - 2 VIEW COMPARISON:  CT 05/10/2023 FINDINGS: Emphysema. Large bulla in the right lower lung. Small right-sided pleural effusion. Atelectasis or scarring in the left lower lung. Large right middle lobe lung mass. No visible pneumothorax. Right scapular fracture with callus. IMPRESSION: 1. No visible pneumothorax. 2. Large right middle lobe lung mass. 3. Small right pleural effusion. 4. Emphysema. Electronically Signed   By: Jasmine Pang M.D.   On: 05/11/2023 15:43   CT CHEST ABDOMEN PELVIS W CONTRAST  Result Date: 05/10/2023 CLINICAL DATA:  Chest pain and abdominal pain and shortness of breath following lung biopsy EXAM: CT CHEST, ABDOMEN, AND PELVIS WITH CONTRAST TECHNIQUE: Multidetector CT imaging of the chest, abdomen and pelvis was performed following the standard protocol during bolus administration of intravenous contrast. RADIATION DOSE REDUCTION: This exam was performed according to the departmental dose-optimization program which includes automated exposure control, adjustment of the mA and/or kV according to patient size and/or use of iterative reconstruction technique. CONTRAST:  OMNIPAQUE IOHEXOL 300 MG/ML  SOLN COMPARISON:  PET-CT 04/19/2023, CT 04/03/2023 FINDINGS: CT CHEST FINDINGS Cardiovascular: Heart size within normal limits. No pericardial effusion. Thoracic aorta is nonaneurysmal. Extensive atherosclerotic calcifications of the  aorta and coronary arteries. Central pulmonary vasculature is within normal limits. Mediastinum/Nodes: Pathologically enlarged mediastinal and right hilar lymph nodes including reference precarinal node measuring 1.8 cm short axis (previously 1.7 cm) and 1.5 cm right hilar node. No axillary or left hilar lymphadenopathy. Lungs/Pleura: Small right pleural effusion with internal density of 40 HU, new since 04/19/2023, which could be related to malignant effusion versus small hemothorax in the setting of recent lung biopsy. Trace right pneumothorax anteriorly adjacent to the large right lung mass (series 4, image 89). Right middle lobe lung mass has slightly increased in size since 04/19/2023, now measuring 7.7 x 6.0 cm transaxially (previously 7.1 x 5.5 cm). Continued enlargement of 2 right lower lobe nodules measuring 2.2 cm and 1.3 cm (previously 1.8 cm and 1.0 cm). New 0.7 cm left lower lobe nodule (series 4, image 128). Background of advanced emphysema. Large bulla in the right middle lobe. Musculoskeletal: Prior cement augmentation of the T6 and T10 vertebral bodies. Chronic bilateral scapular fractures. No discrete lytic or sclerotic bone lesion. No chest wall hematoma. CT ABDOMEN PELVIS FINDINGS Hepatobiliary: Subcentimeter low-density lesion within the right hepatic lobe is too small to characterize (series 2, image 54). Liver appears otherwise unremarkable. Unremarkable gallbladder. No hyperdense gallstone. No biliary dilatation. Pancreas: Unremarkable. No pancreatic ductal dilatation or surrounding inflammatory changes. Spleen: Normal in size without focal abnormality. Adrenals/Urinary Tract: Stable benign adrenal adenomas which do not require follow-up imaging. Areas of cortical scarring of both kidneys. No renal stone or hydronephrosis. Urinary bladder within normal limits. Stomach/Bowel: Stomach is within normal limits. Scattered colonic diverticulosis. No evidence of bowel wall thickening, distention, or  inflammatory changes. Vascular/Lymphatic: Aortic atherosclerosis. Enlarged lymph node in the porta hepatis measuring 1.6 cm. No enlarged pelvic lymph nodes. Reproductive: Prostate is unremarkable. Other: No free fluid. No abdominopelvic fluid collection. No pneumoperitoneum. Musculoskeletal: No acute or significant osseous findings. IMPRESSION: 1. Trace right pneumothorax anteriorly adjacent to the large right lung mass. 2. Small intermediate-high density right pleural effusion, new since 04/19/2023, which could be related to malignant effusion versus small hemothorax in the setting of recent lung biopsy. 3. Right middle lobe lung mass has slightly increased in size since 04/19/2023, now measuring 7.7 x 6.0 cm transaxially (previously 7.1 x  5.5 cm). 4. Continued enlargement of two right lower lobe nodules measuring 2.2 cm and 1.3 cm (previously 1.8 cm and 1.0 cm). New 0.7 cm left lower lobe nodule. Findings are compatible with progression of metastatic disease. 5. Pathologically enlarged mediastinal, right hilar, and upper abdominal lymph nodes, also compatible with metastatic disease. Aortic Atherosclerosis (ICD10-I70.0) and Emphysema (ICD10-J43.9). Electronically Signed   By: Duanne Guess D.O.   On: 05/10/2023 15:11   DG Chest 2 View  Result Date: 05/10/2023 CLINICAL DATA:  Shortness of breath following biopsy EXAM: CHEST - 2 VIEW COMPARISON:  05/10/2023 FINDINGS: Stable aeration and chronic emphysema change with right lung bullous disease and basilar scarring. Large anterior right middle lobe mass again noted, biopsied earlier today. No enlarging or significant pneumothorax by plain radiography. No developing pleural effusion. No new collapse or consolidation. Trachea midline. Normal heart size and vascularity. Chronic osseous changes and previous vertebral augmentations noted on the lateral view. IMPRESSION: 1. Stable chronic emphysema and right lung bullous disease. 2. Stable large right middle lobe  mass 3. No complicating feature by plain radiography. Electronically Signed   By: Judie Petit.  Shick M.D.   On: 05/10/2023 13:29   DG Chest Port 1 View  Result Date: 05/10/2023 CLINICAL DATA:  Large right middle lobe mass status post biopsy EXAM: PORTABLE CHEST 1 VIEW COMPARISON:  05/10/2023, 04/03/2019 FINDINGS: Large right middle lobe mass again noted. No effusion or significant pneumothorax following biopsy by plain radiography. Background emphysema noted. Mid and lower lung parenchymal scarring evident. Stable heart size and vascularity. No edema. No new collapse or consolidation. IMPRESSION: Large right middle lobe mass, recently biopsied. No complicating feature by plain radiography. Electronically Signed   By: Judie Petit.  Shick M.D.   On: 05/10/2023 11:53   CT LUNG MASS BIOPSY  Result Date: 05/10/2023 INDICATION: Large anterior right middle lobe mass EXAM: CT-GUIDED BIOPSY CORE BIOPSY LARGE RIGHT MIDDLE LOBE MASS MEDICATIONS: 1% LIDOCAINE LOCAL ANESTHESIA/SEDATION: 0 mg IV Versed; 25 mcg IV Fentanyl Moderate Sedation Time:  NONE. The patient was continuously monitored during the procedure by the interventional radiology nurse under my direct supervision. PROCEDURE: The procedure, risks, benefits, and alternatives were explained to the patient. Questions regarding the procedure were encouraged and answered. The patient understands and consents to the procedure. Previous imaging reviewed. Patient positioned supine. Noncontrast localization CT performed. The anterior large right middle lobe mass abutting the anterior chest wall was localized and marked for biopsy. Under sterile conditions and local anesthesia, the 17 gauge core biopsy needle was advanced from an anterior intercostal approach to the lesion. Needle position confirmed with CT. 2 18 gauge core biopsies obtained. Samples were intact and non fragmented. These were placed in formalin. Needle tract occluded with the bio sentry device. Postprocedure imaging  demonstrates no complicating feature. Stable emphysema and adjacent bullous disease. Patient tolerated the procedure well without complication. Vital sign monitoring by nursing staff during the procedure will continue as patient is in the special procedures unit for post procedure observation. FINDINGS: The images document guide needle placement within the large anterior right middle lobe mass. Post biopsy images demonstrate no hemorrhage, hematoma, large effusion, or pneumothorax. COMPLICATIONS: None immediate. IMPRESSION: Successful CT-guided core biopsy of the large anterior right middle lobe mass RADIATION DOSE REDUCTION: This exam was performed according to the departmental dose-optimization program which includes automated exposure control, adjustment of the mA and/or kV according to patient size and/or use of iterative reconstruction technique. Electronically Signed   By: Judie Petit.  Shick M.D.  On: 05/10/2023 11:51    PERFORMANCE STATUS (ECOG) : 2 - Symptomatic, <50% confined to bed  Review of Systems Unless otherwise noted, a complete review of systems is negative.  Physical Exam General: NAD Pulmonary: Unlabored, O2 Extremities: no edema, no joint deformities Skin: no rashes Neurological: Weakness but otherwise nonfocal  IMPRESSION: Patient in infusion.  Patient says that he recognizes the severity of his cancer and other health problems.  He understands that his overall prognosis is poor.  He does voice agreement with current scope of treatment.  At baseline, patient lives at home alone.  Both of his children live in [place].  Patient says that he is independent with his own care at present.  He is O2 dependent on 2 L at baseline.  Patient does admit that he often will become dyspneic with exertion and is mostly sedentary during the day.  Patient is in process of completing advance directives.  He would want his son and daughter to be decision-makers if needed.  Patient will benefit from  completion of a MOST form.  PLAN: -Continue current scope of treatment -Patient completing ACP documents -Will benefit from MOST form -Follow-up visit and 3 weeks   Patient expressed understanding and was in agreement with this plan. He also understands that He can call the clinic at any time with any questions, concerns, or complaints.     Time Total: 20 minutes  Visit consisted of counseling and education dealing with the complex and emotionally intense issues of symptom management and palliative care in the setting of serious and potentially life-threatening illness.Greater than 50%  of this time was spent counseling and coordinating care related to the above assessment and plan.  Signed by: Laurette Schimke, PhD, NP-C

## 2023-06-02 ENCOUNTER — Inpatient Hospital Stay: Payer: Medicare HMO | Admitting: Licensed Clinical Social Worker

## 2023-06-02 NOTE — Progress Notes (Signed)
[facility] Clinical Social Work  Initial Assessment   Austin Patterson is a 67 y.o. year old male contacted caregiver by phone. Clinical Social Work was referred by nurse navigator for assessment of psychosocial needs.   SDOH (Social Determinants of Health) assessments performed: Yes   SDOH Screenings   Food Insecurity: No Food Insecurity (04/03/2023)  Housing: Low Risk  (04/03/2023)  Transportation Needs: No Transportation Needs (04/03/2023)  Utilities: Not At Risk (04/03/2023)  Tobacco Use: Medium Risk (05/26/2023)     Distress Screen completed: No     No data to display            Family/Social Information:  Housing Arrangement: patient lives alone Family members/support persons in your life? Family and Friends Transportation concerns: no  Employment: Retired  Income source: Actor concerns: No Type of concern: None Food access concerns: no Religious or spiritual practice: Yes-Patient identifies at [employer] Currently in place:  Medicare  Coping/ Adjustment to diagnosis: Patient understands treatment plan and what happens next? yes Concerns about diagnosis and/or treatment: How will I care for myself Patient reported stressors: Adjusting to my illness Hopes and/or priorities: Family Patient enjoys time with family/ friends Current coping skills/ strengths: Manufacturing systems engineer , General fund of knowledge , and Supportive family/friends     SUMMARY: Current SDOH Barriers:  Financial constraints related to fixed income.  Clinical Social Work Clinical Goal(s):  Explore community resource options for unmet needs related to:  Financial Strain   Interventions: Discussed common feeling and emotions when being diagnosed with cancer, and the importance of support during treatment Informed patient of the support team roles and support services at [facility] Provided CSW contact information and encouraged patient to call with any questions or  concerns Provided patient with information about the [employer] and services available to assist patient at home.  Spoke with patient's son and daughter via phone.  Both live in [place].  CSW to securely email information on grants for lung cancer, list of local Assisted Living Facilities and SNFs, Home Care agencies and MyChart information.   Follow Up Plan: Patient will contact CSW with any support or resource needs Patient verbalizes understanding of plan: Yes    Dorothey Baseman, LCSW Clinical Social Worker [facility]

## 2023-06-03 ENCOUNTER — Telehealth: Payer: Self-pay

## 2023-06-03 ENCOUNTER — Inpatient Hospital Stay: Payer: Medicare HMO

## 2023-06-03 ENCOUNTER — Encounter: Payer: Self-pay | Admitting: Pulmonary Disease

## 2023-06-03 DIAGNOSIS — Z5112 Encounter for antineoplastic immunotherapy: Secondary | ICD-10-CM | POA: Diagnosis not present

## 2023-06-03 DIAGNOSIS — C3491 Malignant neoplasm of unspecified part of right bronchus or lung: Secondary | ICD-10-CM

## 2023-06-03 MED ORDER — PEGFILGRASTIM-JMDB 6 MG/0.6ML ~~LOC~~ SOSY
6.0000 mg | PREFILLED_SYRINGE | Freq: Once | SUBCUTANEOUS | Status: AC
  Administered 2023-06-03: 6 mg via SUBCUTANEOUS
  Filled 2023-06-03: qty 0.6

## 2023-06-03 NOTE — Telephone Encounter (Signed)
Telephone call to patient for follow up after receiving first infusion.   No answer but left message stating we were calling to check on them.  Encouraged patient to call for any questions or concerns.   

## 2023-06-07 ENCOUNTER — Inpatient Hospital Stay: Payer: Medicare HMO

## 2023-06-07 ENCOUNTER — Inpatient Hospital Stay: Payer: Medicare HMO | Admitting: Nurse Practitioner

## 2023-06-08 ENCOUNTER — Inpatient Hospital Stay: Payer: Medicare HMO

## 2023-06-08 ENCOUNTER — Encounter: Payer: Self-pay | Admitting: Nurse Practitioner

## 2023-06-08 ENCOUNTER — Inpatient Hospital Stay (HOSPITAL_BASED_OUTPATIENT_CLINIC_OR_DEPARTMENT_OTHER): Payer: Medicare HMO | Admitting: Nurse Practitioner

## 2023-06-08 ENCOUNTER — Encounter: Payer: Self-pay | Admitting: *Deleted

## 2023-06-08 VITALS — BP 108/71 | HR 105 | Temp 96.3°F | Wt 198.0 lb

## 2023-06-08 DIAGNOSIS — M898X9 Other specified disorders of bone, unspecified site: Secondary | ICD-10-CM | POA: Diagnosis not present

## 2023-06-08 DIAGNOSIS — Z5111 Encounter for antineoplastic chemotherapy: Secondary | ICD-10-CM

## 2023-06-08 DIAGNOSIS — D649 Anemia, unspecified: Secondary | ICD-10-CM

## 2023-06-08 DIAGNOSIS — Z95828 Presence of other vascular implants and grafts: Secondary | ICD-10-CM

## 2023-06-08 DIAGNOSIS — Z09 Encounter for follow-up examination after completed treatment for conditions other than malignant neoplasm: Secondary | ICD-10-CM | POA: Diagnosis not present

## 2023-06-08 DIAGNOSIS — D509 Iron deficiency anemia, unspecified: Secondary | ICD-10-CM | POA: Diagnosis not present

## 2023-06-08 DIAGNOSIS — C3491 Malignant neoplasm of unspecified part of right bronchus or lung: Secondary | ICD-10-CM

## 2023-06-08 DIAGNOSIS — Z5112 Encounter for antineoplastic immunotherapy: Secondary | ICD-10-CM | POA: Diagnosis not present

## 2023-06-08 DIAGNOSIS — D7589 Other specified diseases of blood and blood-forming organs: Secondary | ICD-10-CM | POA: Insufficient documentation

## 2023-06-08 LAB — CBC WITH DIFFERENTIAL/PLATELET
Abs Immature Granulocytes: 0.32 10*3/uL — ABNORMAL HIGH (ref 0.00–0.07)
Basophils Absolute: 0 10*3/uL (ref 0.0–0.1)
Basophils Relative: 0 %
Eosinophils Absolute: 0.5 10*3/uL (ref 0.0–0.5)
Eosinophils Relative: 4 %
HCT: 27.4 % — ABNORMAL LOW (ref 39.0–52.0)
Hemoglobin: 8.6 g/dL — ABNORMAL LOW (ref 13.0–17.0)
Immature Granulocytes: 3 %
Lymphocytes Relative: 8 %
Lymphs Abs: 0.9 10*3/uL (ref 0.7–4.0)
MCH: 27.5 pg (ref 26.0–34.0)
MCHC: 31.4 g/dL (ref 30.0–36.0)
MCV: 87.5 fL (ref 80.0–100.0)
Monocytes Absolute: 1.1 10*3/uL — ABNORMAL HIGH (ref 0.1–1.0)
Monocytes Relative: 9 %
Neutro Abs: 8.8 10*3/uL — ABNORMAL HIGH (ref 1.7–7.7)
Neutrophils Relative %: 76 %
Platelets: 286 10*3/uL (ref 150–400)
RBC: 3.13 MIL/uL — ABNORMAL LOW (ref 4.22–5.81)
RDW: 18.1 % — ABNORMAL HIGH (ref 11.5–15.5)
Smear Review: NORMAL
WBC: 11.7 10*3/uL — ABNORMAL HIGH (ref 4.0–10.5)
nRBC: 0.4 % — ABNORMAL HIGH (ref 0.0–0.2)

## 2023-06-08 LAB — COMPREHENSIVE METABOLIC PANEL
ALT: 24 U/L (ref 0–44)
AST: 29 U/L (ref 15–41)
Albumin: 3.7 g/dL (ref 3.5–5.0)
Alkaline Phosphatase: 90 U/L (ref 38–126)
Anion gap: 9 (ref 5–15)
BUN: 16 mg/dL (ref 8–23)
CO2: 26 mmol/L (ref 22–32)
Calcium: 8.8 mg/dL — ABNORMAL LOW (ref 8.9–10.3)
Chloride: 95 mmol/L — ABNORMAL LOW (ref 98–111)
Creatinine, Ser: 0.97 mg/dL (ref 0.61–1.24)
GFR, Estimated: >60 mL/min (ref >60)
Glucose, Bld: 172 mg/dL — ABNORMAL HIGH (ref 70–99)
Potassium: 3.3 mmol/L — ABNORMAL LOW (ref 3.5–5.1)
Sodium: 130 mmol/L — ABNORMAL LOW (ref 135–145)
Total Bilirubin: 0.4 mg/dL (ref 0.3–1.2)
Total Protein: 6.9 g/dL (ref 6.5–8.1)

## 2023-06-08 LAB — IRON AND TIBC
Iron: 28 ug/dL — ABNORMAL LOW (ref 45–182)
Saturation Ratios: 6 % — ABNORMAL LOW (ref 17.9–39.5)
TIBC: 475 ug/dL — ABNORMAL HIGH (ref 250–450)
UIBC: 447 ug/dL

## 2023-06-08 LAB — FERRITIN: Ferritin: 147 ng/mL (ref 24–336)

## 2023-06-08 LAB — FOLATE: Folate: 12.7 ng/mL (ref >5.9)

## 2023-06-08 LAB — VITAMIN B12: Vitamin B-12: 533 pg/mL (ref 180–914)

## 2023-06-08 MED ORDER — HEPARIN SOD (PORK) LOCK FLUSH 100 UNIT/ML IV SOLN
500.0000 [IU] | Freq: Once | INTRAVENOUS | Status: AC
  Administered 2023-06-08: 500 [IU] via INTRAVENOUS
  Filled 2023-06-08: qty 5

## 2023-06-08 MED ORDER — OXYCODONE HCL 5 MG PO TABS: 5.0000 mg | ORAL_TABLET | Freq: Four times a day (QID) | ORAL | 0 refills | Status: DC | PRN

## 2023-06-08 MED ORDER — SODIUM CHLORIDE 0.9% FLUSH
10.0000 mL | Freq: Once | INTRAVENOUS | Status: AC
  Administered 2023-06-08: 10 mL via INTRAVENOUS
  Filled 2023-06-08: qty 10

## 2023-06-08 NOTE — Progress Notes (Signed)
[facility] CONSULT NOTE  Patient Care Team: Pcp, No as PCP - General Debbe Odea, MD as PCP - Cardiology (Cardiology) Salena Saner, MD as Consulting Physician (Pulmonary Disease) Glory Buff, RN as Oncology Nurse Navigator Michaelyn Barter, MD as Consulting Physician (Oncology)  CANCER STAGING   Cancer Staging  Sarcomatoid carcinoma of lung [facility]) Staging form: Lung, AJCC 8th Edition - Clinical: Stage IV (cT4, cN3, cM1) - Signed by Michaelyn Barter, MD on 05/20/2023 Stage prefix: Initial diagnosis  HISTORY OF PRESENTING ILLNESS:  Austin Patterson 67 y.o. 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  CTA chest showed new 6.8 x 4.9 cm soft tissue mass in the anterior right midlung field.  10 mm nodule in the right lower lung field.  1.8 cm nodule in the right lower lung field.  Large bulla and emphysema present.  Enlarged lymph nodes in the mediastinum measuring 2.1 x 1.9 cm right hilum 2.8 x 1.8 cm.  2.1 cm nodule in right adrenal.  1.8 cm nodule in the left adrenal suggestive of adenomas.  No evidence of PE.    He is a remote smoker quit in 2016.  Prior smoked 2 packs a day since age 2.  He is on 2 L oxygen since 2017.  Lives alone.  Does not have family close by.  Follows with Dr. Jayme Cloud.   Interval history: Patient returns to clinic for chemotherapy follow up. Tolerated first cycle well. He symptomatically feels better. Complains of aches and pains of large joints which started after receiving fulphila injection. Pain interferes with sleep. He has been taking  claritin and tylenol. Had some tylenol with codeine on hand which improved pain.    Oncology History  Sarcomatoid carcinoma of lung (HCC)  05/18/2023 Initial Diagnosis   Sarcomatoid carcinoma of lung (HCC)   05/20/2023 Cancer Staging   Staging form: Lung, AJCC 8th Edition - Clinical: Stage IV (cT4, cN3, cM1) - Signed by Michaelyn Barter, MD on 05/20/2023 Stage prefix: Initial diagnosis   06/01/2023 -  Chemotherapy   Patient is on Treatment Plan : LUNG NSCLC Carboplatin (6) + Paclitaxel (200) + Pembrolizumab (200) D1 q21d x 4 cycles / Pembrolizumab (200) Maintenance D1 q21d       MEDICAL HISTORY:  Past Medical History:  Diagnosis Date   Abdominal pain 12/07/2022   Acute urinary retention 12/07/2022   Chest pain 01/24/2022   COPD (chronic obstructive pulmonary disease) (HCC)    GERD (gastroesophageal reflux disease)    Hemothorax on right 05/11/2023   Hyperlipidemia    Hypertension    Pleuritic pain 05/10/2023   Pneumothorax after biopsy 05/11/2023   Post procedure discomfort 05/13/2023   Pulmonary fibrosis (HCC) 11/2015    SURGICAL HISTORY: Past Surgical History:  Procedure Laterality Date   COLONOSCOPY     CORONARY STENT PLACEMENT     ESOPHAGOGASTRODUODENOSCOPY (EGD) WITH PROPOFOL N/A 09/23/2016   Procedure: ESOPHAGOGASTRODUODENOSCOPY (EGD) WITH PROPOFOL;  Surgeon: Wyline Mood, MD;  Location: [facility] ENDOSCOPY;  Service: Endoscopy;  Laterality: N/A;   IR IMAGING GUIDED PORT INSERTION  05/26/2023   KYPHOPLASTY N/A 03/14/2020   Procedure: T7 & T11 KYPHOPLASTY;  Surgeon: Kennedy Bucker, MD;  Location: [facility] ORS;  Service: Orthopedics;  Laterality: N/A;   SHOULDER ACROMIOPLASTY      SOCIAL HISTORY: Social History   Socioeconomic History   Marital status: Legally Separated    Spouse name: Not on file   Number of children: Not on file   Years of education: Not on file   Highest education level: Not on file  Occupational History   Not on file  Tobacco Use   Smoking status: Former     Current packs/day: 0.00    Average packs/day: 2.0 packs/day for 50.0 years (100.0 ttl pk-yrs)    Types: Cigarettes    Start date: 10/13/1965    Quit date: 10/14/2015    Years since quitting: 7.6   Smokeless tobacco: Former  Building services engineer status: Never Used  Substance and Sexual Activity   Alcohol use: Yes    Alcohol/week: 56.0 standard drinks of alcohol    Types: 56 Cans of beer per week    Comment: "I sit around and drink beer, that's all I got to do"   Drug use: No   Sexual activity: Not Currently  Other Topics Concern   Not on file  Social History Narrative   Not on file   Social Determinants of Health   Financial Resource Strain: Not on file  Food Insecurity: No Food Insecurity (04/03/2023)   Hunger Vital Sign    Worried About Running Out of Food in the Last Year: Never true    Ran Out of Food in the Last Year: Never true  Transportation Needs: No Transportation Needs (04/03/2023)   PRAPARE - Administrator, Civil Service (Medical): No    Lack of Transportation (Non-Medical): No  Physical Activity: Not on file  Stress: Not on file  Social Connections: Not on file  Intimate Partner Violence: Not At Risk (04/03/2023)   Humiliation, Afraid, Rape, and Kick questionnaire    Fear of Current or Ex-Partner: No    Emotionally Abused: No    Physically Abused: No    Sexually Abused: No    FAMILY HISTORY: Family History  Problem Relation Age of Onset   Heart disease Mother     ALLERGIES:  is allergic to amoxicillin and tizanidine.  MEDICATIONS:  Current Outpatient Medications  Medication Sig Dispense Refill   acetaminophen (TYLENOL) 500 MG tablet Take 1,000 mg by mouth every 6 (six) hours as needed for moderate pain or headache.     albuterol (VENTOLIN HFA) 108 (90 Base) MCG/ACT inhaler Inhale 2 puffs into the lungs every 6 (six) hours as needed for wheezing or shortness of breath. 8 g 11   amLODipine (NORVASC) 10 MG tablet Take 1 tablet (10 mg total)  by mouth daily. 90 tablet 3   aspirin EC 81 MG tablet Take 1 tablet (81 mg total) by mouth daily. 90 tablet 3   atorvastatin (LIPITOR) 40 MG tablet Take 1 tablet (40 mg total) by mouth at bedtime. 90 tablet 3   CALCIUM 600/VITAMIN D 600-10 MG-MCG TABS Take 1 tablet by mouth 2 (two) times daily.     chlorpheniramine-HYDROcodone (TUSSIONEX) 10-8 MG/5ML Take 5 mLs by mouth at bedtime as needed for cough. 70 mL 0   clopidogrel (PLAVIX) 75 MG tablet Take 1 tablet (75 mg total) by mouth daily. 90 tablet 3   dexamethasone (DECADRON) 4 MG tablet Take 2 tablets (8mg ) by mouth daily starting the day after carboplatin for 3 days. Take with food 30 tablet 1   Dupilumab (DUPIXENT)  300 MG/2ML SOPN Inject 300 mg into the skin every 14 (fourteen) days. 12 mL 1   EPINEPHrine 0.3 mg/0.3 mL IJ SOAJ injection Inject 0.3 mg into the muscle as needed for anaphylaxis.     fluticasone-salmeterol (ADVAIR DISKUS) 250-50 MCG/ACT AEPB Inhale 1 puff into the lungs in the morning and at bedtime. 60 each 11   gabapentin (NEURONTIN) 300 MG capsule Take 1 capsule (300 mg total) by mouth 3 (three) times daily. 90 capsule 1   guaiFENesin-dextromethorphan (ROBITUSSIN DM) 100-10 MG/5ML syrup Take 5 mLs by mouth every 4 (four) hours as needed for cough. 118 mL 0   ipratropium-albuterol (DUONEB) 0.5-2.5 (3) MG/3ML SOLN INHALE 1 VIAL THROUGH NEBULIZER EVERY 6 HOURS 270 mL 2   KLOR-CON M20 20 MEQ tablet Take 20 mEq by mouth daily.     lidocaine-prilocaine (EMLA) cream Apply to affected area once 30 g 3   losartan-hydrochlorothiazide (HYZAAR) 100-25 MG tablet Take 1 tablet by mouth daily.     magnesium oxide (MAG-OX) 400 (241.3 Mg) MG tablet Take 1 tablet (400 mg total) by mouth daily. 30 tablet 0   metoprolol succinate (TOPROL-XL) 50 MG 24 hr tablet Take 1 tablet (50 mg total) by mouth daily. 90 tablet 3   montelukast (SINGULAIR) 10 MG tablet Take 10 mg by mouth daily.     Multiple Vitamin (MULTIVITAMIN WITH MINERALS) TABS tablet Take 1  tablet by mouth daily. 30 tablet 0   ondansetron (ZOFRAN) 8 MG tablet Take 1 tablet (8 mg total) by mouth every 8 (eight) hours as needed for nausea or vomiting. Start on the third day after carboplatin. 30 tablet 1   pantoprazole (PROTONIX) 40 MG tablet Take 1 tablet (40 mg total) by mouth 2 (two) times daily. 90 tablet 3   predniSONE (DELTASONE) 10 MG tablet Take 1 tablet (10 mg total) by mouth daily as needed. Take as directed. 30 tablet 1   prochlorperazine (COMPAZINE) 10 MG tablet Take 1 tablet (10 mg total) by mouth every 6 (six) hours as needed for nausea or vomiting. 30 tablet 1   senna-docusate (SENOKOT-S) 8.6-50 MG tablet Take 1 tablet by mouth 2 (two) times daily. 30 tablet 1   Spacer/Aero-Holding Chambers (AEROCHAMBER MV) inhaler Use as instructed 1 each 0   tamsulosin (FLOMAX) 0.4 MG CAPS capsule Take 1 capsule (0.4 mg total) by mouth daily after supper. 30 capsule 2   No current facility-administered medications for this visit.    REVIEW OF SYSTEMS:   Review of Systems  Constitutional:  Positive for malaise/fatigue. Negative for chills, fever and weight loss.  HENT:  Negative for hearing loss, nosebleeds, sore throat and tinnitus.   Eyes:  Negative for blurred vision and double vision.  Respiratory:  Positive for cough. Negative for hemoptysis, shortness of breath and wheezing.   Cardiovascular:  Negative for chest pain, palpitations and leg swelling.  Gastrointestinal:  Negative for abdominal pain, blood in stool, constipation, diarrhea, melena, nausea and vomiting.  Genitourinary:  Negative for dysuria and urgency.  Musculoskeletal:  Positive for joint pain and myalgias. Negative for back pain and falls.  Skin:  Negative for itching and rash.  Neurological:  Negative for dizziness, tingling, sensory change, loss of consciousness, weakness and headaches.  Endo/Heme/Allergies:  Negative for environmental allergies. Does not bruise/bleed easily.  Psychiatric/Behavioral:   Negative for depression. The patient is not nervous/anxious and does not have insomnia.     PHYSICAL EXAMINATION: ECOG PERFORMANCE STATUS: 2 - Symptomatic, <50% confined to bed Vitals:  06/08/23 1121  BP: 108/71  Pulse: (!) 105  Temp: (!) 96.3 F (35.7 C)   Filed Weights   06/08/23 1121  Weight: 198 lb (89.8 kg)   General: Well-developed, well-nourished, no acute distress. On oxygen via . In wheelchair.  Eyes: Pink conjunctiva, anicteric sclera. Lungs: diminished bilaterally Heart: Regular rate and rhythm.  Abdomen: Soft, nontender, nondistended.  Musculoskeletal: No edema, cyanosis, or clubbing. Neuro: Alert, answering all questions appropriately. Cranial nerves grossly intact. Skin: No rashes or petechiae noted. Psych: Normal affect.   LABORATORY DATA:  I have reviewed the data as listed Lab Results  Component Value Date   WBC 11.7 (H) 06/08/2023   HGB 8.6 (L) 06/08/2023   HCT 27.4 (L) 06/08/2023   MCV 87.5 06/08/2023   PLT 286 06/08/2023   Recent Labs    05/11/23 0257 05/13/23 0514 06/01/23 0838 06/08/23 1104  NA 130* 132* 123* 130*  K 4.1 3.9 3.4* 3.3*  CL 92* 90* 88* 95*  CO2 29 31 25 26   GLUCOSE 115* 112* 178* 172*  BUN 18 19 18 16   CREATININE 0.92 0.68 0.87 0.97  CALCIUM 8.8* 9.1 8.7* 8.8*  GFRNONAA >60 >60 >60 >60  PROT 6.9  --  7.1 6.9  ALBUMIN 4.1  --  3.8 3.7  AST 20  --  28 29  ALT 18  --  20 24  ALKPHOS 63  --  82 90  BILITOT 0.6  --  0.3 0.4    RADIOGRAPHIC STUDIES: I have personally reviewed the radiological images as listed and agreed with the findings in the report. IR IMAGING GUIDED PORT INSERTION  Result Date: 05/26/2023 INDICATION: IV access for chemotherapy EXAM: Chest port placement using ultrasound and fluoroscopic guidance MEDICATIONS: Documented in the EMR ANESTHESIA/SEDATION: Moderate (conscious) sedation was employed during this procedure. A total of Versed 1 mg and Fentanyl 50 mcg was administered intravenously. Moderate  Sedation Time: 28 minutes. The patient's level of consciousness and vital signs were monitored continuously by radiology nursing throughout the procedure under my direct supervision. FLUOROSCOPY TIME:  Fluoroscopy Time: 0.4 minutes (2 mGy) COMPLICATIONS: None immediate. PROCEDURE: Informed written consent was obtained from the patient after a thorough discussion of the procedural risks, benefits and alternatives. All questions were addressed. Maximal Sterile Barrier Technique was utilized including caps, mask, sterile gowns, sterile gloves, sterile drape, hand hygiene and skin antiseptic. A timeout was performed prior to the initiation of the procedure. The patient was placed supine on the exam table. The right neck and chest was prepped and draped in the standard sterile fashion. A preliminary ultrasound of the right neck was performed and demonstrates a patent right internal jugular vein. A permanent ultrasound image was stored in the electronic medical record. The overlying skin was anesthetized with 1% Lidocaine. Using ultrasound guidance, access was obtained into the right internal jugular vein using a 21 gauge micropuncture set. A wire was advanced into the SVC, a short incision was made at the puncture site, and serial dilatation performed. Next, in an ipsilateral infraclavicular location, an incision was made at the site of the subcutaneous reservoir. Blunt dissection was used to open a pocket to contain the reservoir. A subcutaneous tunnel was then created from the port site to the puncture site. A(n) 8 Fr single lumen catheter was advanced through the tunnel. The catheter was attached to the port and this was placed in the subcutaneous pocket. Under fluoroscopic guidance, a peel away sheath was placed, and the catheter was trimmed to the  appropriate length and was advanced into the central veins. The catheter length is 24 cm. The tip of the catheter lies near the superior cavoatrial junction. The port  flushes and aspirates appropriately. The port was flushed and locked with heparinized saline. The port pocket was closed in 2 layers using 3-0 and 4-0 Vicryl/absorbable suture. Dermabond was also applied to both incisions. The patient tolerated the procedure well and was transferred to recovery in stable condition. IMPRESSION: Successful placement of a right-sided chest port via the right internal jugular vein. The port is ready for immediate use. Electronically Signed   By: Olive Bass M.D.   On: 05/26/2023 12:10   CT HEAD WO CONTRAST ( )  Result Date: 05/12/2023 CLINICAL DATA:  Initial evaluation for mental status change, unknown cause. EXAM: CT HEAD WITHOUT CONTRAST TECHNIQUE: Contiguous axial images were obtained from the base of the skull through the vertex without intravenous contrast. RADIATION DOSE REDUCTION: This exam was performed according to the departmental dose-optimization program which includes automated exposure control, adjustment of the mA and/or kV according to patient size and/or use of iterative reconstruction technique. COMPARISON:  Comparison made with prior MRI from 04/20/2023. FINDINGS: Brain: Cerebral volume within normal limits. 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 No acute intracranial hemorrhage. No acute large vessel territory infarct. No mass lesion or midline shift. No hydrocephalus or extra-axial fluid collection. Vascular: No abnormal hyperdense vessel. Scattered vascular calcifications noted within the carotid siphons. Skull: Scalp soft tissues demonstrate no acute finding. Calvarium intact. Sinuses/Orbits: Globes and orbital soft tissues within normal limits. Scattered mucosal thickening present about the ethmoidal air cells and maxillary sinuses. No significant mastoid effusion. Other: None. IMPRESSION: 1. No acute intracranial abnormality. 2. Moderate chronic microvascular ischemic disease with small remote  left cerebellar infarct. Electronically Signed   By: Rise Mu M.D.   On: 05/12/2023 01:46   DG Chest Port 1 View  Result Date: 05/11/2023 CLINICAL DATA:  Pneumothorax EXAM: PORTABLE CHEST 1 VIEW COMPARISON:  Chest x-ray 05/11/2023.  Chest x-ray 05/11/2023. FINDINGS: The heart size and mediastinal contours are within normal limits. Right middle lobe masslike opacity is unchanged. Large bullae noted in the lateral right lower hemithorax is unchanged. No definite pneumothorax or pleural effusion identified. No new focal lung infiltrate. No acute fractures. IMPRESSION: 1. No definite pneumothorax identified. 2. Right middle lobe masslike opacities unchanged. Electronically Signed   By: Darliss Cheney M.D.   On: 05/11/2023 23:43   CT CHEST WO CONTRAST  Result Date: 05/11/2023 CLINICAL DATA:  Follow-up pneumothorax EXAM: CT CHEST WITHOUT CONTRAST TECHNIQUE: Multidetector CT imaging of the chest was performed following the standard protocol without IV contrast. RADIATION DOSE REDUCTION: This exam was performed according to the departmental dose-optimization program which includes automated exposure control, adjustment of the mA and/or kV according to patient size and/or use of iterative reconstruction technique. COMPARISON:  CT 05/10/2023, chest x-ray 05/11/2023 FINDINGS: Cardiovascular: Limited evaluation without intravenous contrast. Advanced aortic atherosclerosis. No aneurysm. Coronary vascular disease. Normal cardiac size. No pericardial effusion Mediastinum/Nodes: Midline trachea. No thyroid mass. Enlarged right hilar nodes better seen on prior contrast enhanced examination. Enlarged right precarinal lymph node measuring 2.1 cm, previously 1.8 cm, series 2, image 51. Esophagus within normal limits. Lungs/Pleura: Emphysema. Large right middle lobe lung mass measuring approximately 7.7 by 6.5 cm on series 4, image 98, stable in the short interval follow-up, prior maximum measurement of 7.7 cm. Very  tiny pneumothorax in the right middle lobe anterior to  the lung mass, series 4, image 79, not significantly increased in size. Small volume right pleural effusion slightly increased, now measuring closer to water density. Right lower lobe posterior pulmonary nodules are more obscured by pleural fluid and atelectasis. Stable 7 mm medial left lung base pulmonary nodule series 4, image 127. Upper Abdomen: Bilateral adrenal nodules, measuring 2.5 cm on the right with density value of -6.7. Left adrenal nodule with density value of 0.7, this measures 2 cm. Findings would be consistent with benign adenoma. Hyperdensity in the gallbladder could reflect vicarious excretion of contrast. No calcified stones seen on the recent prior CT. Enlarged porta hepatis node measuring 19 mm, previously 17 mm. Musculoskeletal: Post augmentation changes T7 and T11. chronic bilateral scapular fractures IMPRESSION: 1. Very tiny right anterior pneumothorax, not significantly increased in size. 2. Small volume right pleural effusion slightly increased in size, but now measuring closer to water density. Short-term stability of right middle lobe large lung mass measuring up to 7.7 cm. 3. Right lower lobe suspected metastatic pulmonary nodules are more obscured by pleural fluid and atelectasis on this exam. Stable 7 mm medial left lung base pulmonary nodule. 4. Enlarged mediastinal and right hilar nodes as seen on prior contrast enhanced exam, concerning for metastatic disease. 5. Bilateral adrenal nodules consistent with benign adenomas. Aortic Atherosclerosis (ICD10-I70.0) and Emphysema (ICD10-J43.9). Electronically Signed   By: Jasmine Pang M.D.   On: 05/11/2023 17:44   [place] RT LOWER EXTREM LTD SOFT TISSUE NON VASCULAR  Result Date: 05/11/2023 CLINICAL DATA:  Foot swelling.  Trauma. EXAM: ULTRASOUND right LOWER EXTREMITY LIMITED TECHNIQUE: Ultrasound examination of the lower extremity soft tissues was performed in the area of clinical  concern. COMPARISON:  Foot x-ray 05/11/2023 FINDINGS: Specific ultrasound in the area of concern about the dorsal aspect of the foot demonstrates oblong heterogeneous area which is hypoechoic measuring 3.5 x 1.0 by 4.2 cm. No specific blood flow on Doppler. Margins are somewhat ill-defined. This corresponds to the area of thickening on x-ray. IMPRESSION: 4.2 cm lobular hypoechoic area along the dorsal aspect of the right foot. Possibilities with the patient's history would include hematoma but there is a differential by ultrasound. Recommend either follow-up to confirm resolution and exclude underlying lesion versus additional workup with MRI as clinically appropriate. Electronically Signed   By: Karen Kays M.D.   On: 05/11/2023 16:12   DG Foot Complete Right  Result Date: 05/11/2023 CLINICAL DATA:  Foot swelling EXAM: RIGHT FOOT COMPLETE - 3+ VIEW COMPARISON:  None Available. FINDINGS: No acute displaced fracture or malalignment. Possible remote fracture involving the fifth proximal phalanx. Mild degenerative change at the first MTP joint. Prominent dorsal soft tissue swelling IMPRESSION: Prominent dorsal soft tissue swelling. No acute osseous abnormality. Electronically Signed   By: Jasmine Pang M.D.   On: 05/11/2023 15:47   DG Chest 2 View  Result Date: 05/11/2023 CLINICAL DATA:  Follow-up pneumothorax EXAM: CHEST - 2 VIEW COMPARISON:  CT 05/10/2023 FINDINGS: Emphysema. Large bulla in the right lower lung. Small right-sided pleural effusion. Atelectasis or scarring in the left lower lung. Large right middle lobe lung mass. No visible pneumothorax. Right scapular fracture with callus. IMPRESSION: 1. No visible pneumothorax. 2. Large right middle lobe lung mass. 3. Small right pleural effusion. 4. Emphysema. Electronically Signed   By: Jasmine Pang M.D.   On: 05/11/2023 15:43   CT CHEST ABDOMEN PELVIS W CONTRAST  Result Date: 05/10/2023 CLINICAL DATA:  Chest pain and abdominal pain and shortness of  breath following  lung biopsy EXAM: CT CHEST, ABDOMEN, AND PELVIS WITH CONTRAST TECHNIQUE: Multidetector CT imaging of the chest, abdomen and pelvis was performed following the standard protocol during bolus administration of intravenous contrast. RADIATION DOSE REDUCTION: This exam was performed according to the departmental dose-optimization program which includes automated exposure control, adjustment of the mA and/or kV according to patient size and/or use of iterative reconstruction technique. CONTRAST:  OMNIPAQUE IOHEXOL 300 MG/ML  SOLN COMPARISON:  PET-CT 04/19/2023, CT 04/03/2023 FINDINGS: CT CHEST FINDINGS Cardiovascular: Heart size within normal limits. No pericardial effusion. Thoracic aorta is nonaneurysmal. Extensive atherosclerotic calcifications of the aorta and coronary arteries. Central pulmonary vasculature is within normal limits. Mediastinum/Nodes: Pathologically enlarged mediastinal and right hilar lymph nodes including reference precarinal node measuring 1.8 cm short axis (previously 1.7 cm) and 1.5 cm right hilar node. No axillary or left hilar lymphadenopathy. Lungs/Pleura: Small right pleural effusion with internal density of 40 HU, new since 04/19/2023, which could be related to malignant effusion versus small hemothorax in the setting of recent lung biopsy. Trace right pneumothorax anteriorly adjacent to the large right lung mass (series 4, image 89). Right middle lobe lung mass has slightly increased in size since 04/19/2023, now measuring 7.7 x 6.0 cm transaxially (previously 7.1 x 5.5 cm). Continued enlargement of 2 right lower lobe nodules measuring 2.2 cm and 1.3 cm (previously 1.8 cm and 1.0 cm). New 0.7 cm left lower lobe nodule (series 4, image 128). Background of advanced emphysema. Large bulla in the right middle lobe. Musculoskeletal: Prior cement augmentation of the T6 and T10 vertebral bodies. Chronic bilateral scapular fractures. No discrete lytic or sclerotic bone  lesion. No chest wall hematoma. CT ABDOMEN PELVIS FINDINGS Hepatobiliary: Subcentimeter low-density lesion within the right hepatic lobe is too small to characterize (series 2, image 54). Liver appears otherwise unremarkable. Unremarkable gallbladder. No hyperdense gallstone. No biliary dilatation. Pancreas: Unremarkable. No pancreatic ductal dilatation or surrounding inflammatory changes. Spleen: Normal in size without focal abnormality. Adrenals/Urinary Tract: Stable benign adrenal adenomas which do not require follow-up imaging. Areas of cortical scarring of both kidneys. No renal stone or hydronephrosis. Urinary bladder within normal limits. Stomach/Bowel: Stomach is within normal limits. Scattered colonic diverticulosis. No evidence of bowel wall thickening, distention, or inflammatory changes. Vascular/Lymphatic: Aortic atherosclerosis. Enlarged lymph node in the porta hepatis measuring 1.6 cm. No enlarged pelvic lymph nodes. Reproductive: Prostate is unremarkable. Other: No free fluid. No abdominopelvic fluid collection. No pneumoperitoneum. Musculoskeletal: No acute or significant osseous findings. IMPRESSION: 1. Trace right pneumothorax anteriorly adjacent to the large right lung mass. 2. Small intermediate-high density right pleural effusion, new since 04/19/2023, which could be related to malignant effusion versus small hemothorax in the setting of recent lung biopsy. 3. Right middle lobe lung mass has slightly increased in size since 04/19/2023, now measuring 7.7 x 6.0 cm transaxially (previously 7.1 x 5.5 cm). 4. Continued enlargement of two right lower lobe nodules measuring 2.2 cm and 1.3 cm (previously 1.8 cm and 1.0 cm). New 0.7 cm left lower lobe nodule. Findings are compatible with progression of metastatic disease. 5. Pathologically enlarged mediastinal, right hilar, and upper abdominal lymph nodes, also compatible with metastatic disease. Aortic Atherosclerosis (ICD10-I70.0) and Emphysema  (ICD10-J43.9). Electronically Signed   By: Duanne Guess D.O.   On: 05/10/2023 15:11   DG Chest 2 View  Result Date: 05/10/2023 CLINICAL DATA:  Shortness of breath following biopsy EXAM: CHEST - 2 VIEW COMPARISON:  05/10/2023 FINDINGS: Stable aeration and chronic emphysema change with right lung bullous disease  and basilar scarring. Large anterior right middle lobe mass again noted, biopsied earlier today. No enlarging or significant pneumothorax by plain radiography. No developing pleural effusion. No new collapse or consolidation. Trachea midline. Normal heart size and vascularity. Chronic osseous changes and previous vertebral augmentations noted on the lateral view. IMPRESSION: 1. Stable chronic emphysema and right lung bullous disease. 2. Stable large right middle lobe mass 3. No complicating feature by plain radiography. Electronically Signed   By: Judie Petit.  Shick M.D.   On: 05/10/2023 13:29   DG Chest Port 1 View  Result Date: 05/10/2023 CLINICAL DATA:  Large right middle lobe mass status post biopsy EXAM: PORTABLE CHEST 1 VIEW COMPARISON:  05/10/2023, 04/03/2019 FINDINGS: Large right middle lobe mass again noted. No effusion or significant pneumothorax following biopsy by plain radiography. Background emphysema noted. Mid and lower lung parenchymal scarring evident. Stable heart size and vascularity. No edema. No new collapse or consolidation. IMPRESSION: Large right middle lobe mass, recently biopsied. No complicating feature by plain radiography. Electronically Signed   By: Judie Petit.  Shick M.D.   On: 05/10/2023 11:53   CT LUNG MASS BIOPSY  Result Date: 05/10/2023 INDICATION: Large anterior right middle lobe mass EXAM: CT-GUIDED BIOPSY CORE BIOPSY LARGE RIGHT MIDDLE LOBE MASS MEDICATIONS: 1% LIDOCAINE LOCAL ANESTHESIA/SEDATION: 0 mg IV Versed; 25 mcg IV Fentanyl Moderate Sedation Time:  NONE. The patient was continuously monitored during the procedure by the interventional radiology nurse under my direct  supervision. PROCEDURE: The procedure, risks, benefits, and alternatives were explained to the patient. Questions regarding the procedure were encouraged and answered. The patient understands and consents to the procedure. Previous imaging reviewed. Patient positioned supine. Noncontrast localization CT performed. The anterior large right middle lobe mass abutting the anterior chest wall was localized and marked for biopsy. Under sterile conditions and local anesthesia, the 17 gauge core biopsy needle was advanced from an anterior intercostal approach to the lesion. Needle position confirmed with CT. 2 18 gauge core biopsies obtained. Samples were intact and non fragmented. These were placed in formalin. Needle tract occluded with the bio sentry device. Postprocedure imaging demonstrates no complicating feature. Stable emphysema and adjacent bullous disease. Patient tolerated the procedure well without complication. Vital sign monitoring by nursing staff during the procedure will continue as patient is in the special procedures unit for post procedure observation. FINDINGS: The images document guide needle placement within the large anterior right middle lobe mass. Post biopsy images demonstrate no hemorrhage, hematoma, large effusion, or pneumothorax. COMPLICATIONS: None immediate. IMPRESSION: Successful CT-guided core biopsy of the large anterior right middle lobe mass RADIATION DOSE REDUCTION: This exam was performed according to the departmental dose-optimization program which includes automated exposure control, adjustment of the mA and/or kV according to patient size and/or use of iterative reconstruction technique. Electronically Signed   By: Judie Petit.  Shick M.D.   On: 05/10/2023 11:51    ASSESSMENT & PLAN:  Austin Patterson 67 y.o. male who returns to clinic for follow up for   Chemotherapy follow up- for stage IV lung cancer with metastases to mediastinum and porta hepatis. Chemotherapy recommended  however, d/t poor functional status, dose reduced. Initiated Carboplatin (AUC 4)- paclitaxel (125 mg/m2)- pembrolizumab on 06/01/23 with GCSF support. Tolerated well. Follow up with Dr Alena Bills for consideration of cycle 2 as scheduled.  Chronic respiratory failure, asthma, COPD, Idiopathic pulmonary fibrosis- on 2 L oxygen. Managed by pulmonary. On dupixent. On prednisone 10 mg per pulmonology. Contraindicated with immunotherapy. Reached out to Dr Jayme Cloud who advises this  can be tapered. Per patient, he has never started taking prednisone. Discontinued.  Hyponatremia- -Chronic in nature. Corrected sodium post chemotherapy was 125. Improved today. Monitor. Hydrochlorothiazide discontinued.  Hypertension & CAD- managed by Dr Azucena Cecil. Prefer to minimize hydrochlorothiazide in setting of hyponatremia and chemotherapy. BP is soft and he is requiring supplemental IV fluids. Plan to hold losartan-hydrochlorothiazide combo pill. May decrease dose of metoprolol or stop at next visit. No history of arhythmia. On plavix for CAD post PCI in 2009.  Normocytic anemia- Baseline hemoglobin between 9-10 since February 2024. Ferritin, iron studies, b12, folate are pending. He does have history of hiatal hernia. Iron sat 6%, elevated TIBC. Labs consistent with iron deficiency. Defer to Dr Alena Bills.  Myalgias and arthralgias - secondary to GCSF administration. Continue claritin and tylenol. Start oxycodone 5 mg q6h prn. Reviewed need for bowel prophylaxis and risks.  Port-a-cath: placed for administration of chemotherapy on 05/26/23.  Goals of care: treatment given with palliative intent.   Disposition:  Deaccess port. Hold fluids.  RTC as scheduled    All questions were answered. The patient knows to call the clinic with any problems, questions or concerns. No barriers to learning was detected.  Alinda Dooms, NP 06/08/2023

## 2023-06-09 ENCOUNTER — Encounter: Payer: Self-pay | Admitting: Internal Medicine

## 2023-06-09 NOTE — Progress Notes (Signed)
Met with patient during follow up visit after receiving first cycle of chemo last week. Pt states that he wants to know if there are any options to help him with transportation in case his landlady is unable to bring him to his appts. Message sent to transportation services to gather more information and will inform patient at next visit. No other questions or needs at this time. Instructed pt to call with any future needs. Pt verbalized understanding.

## 2023-06-12 ENCOUNTER — Emergency Department: Payer: Medicare HMO

## 2023-06-12 ENCOUNTER — Other Ambulatory Visit: Payer: Self-pay

## 2023-06-12 ENCOUNTER — Encounter: Payer: Self-pay | Admitting: Internal Medicine

## 2023-06-12 ENCOUNTER — Inpatient Hospital Stay
Admission: EM | Admit: 2023-06-12 | Discharge: 2023-06-22 | DRG: 314 | Disposition: A | Discharge status: Home Health | Payer: Medicare HMO | Attending: Student | Admitting: Student

## 2023-06-12 DIAGNOSIS — E785 Hyperlipidemia, unspecified: Secondary | ICD-10-CM | POA: Diagnosis present

## 2023-06-12 DIAGNOSIS — S9031XA Contusion of right foot, initial encounter: Secondary | ICD-10-CM | POA: Diagnosis present

## 2023-06-12 DIAGNOSIS — L03115 Cellulitis of right lower limb: Secondary | ICD-10-CM | POA: Diagnosis not present

## 2023-06-12 DIAGNOSIS — Z8249 Family history of ischemic heart disease and other diseases of the circulatory system: Secondary | ICD-10-CM

## 2023-06-12 DIAGNOSIS — Y848 Other medical procedures as the cause of abnormal reaction of the patient, or of later complication, without mention of misadventure at the time of the procedure: Secondary | ICD-10-CM | POA: Diagnosis present

## 2023-06-12 DIAGNOSIS — L02213 Cutaneous abscess of chest wall: Secondary | ICD-10-CM | POA: Diagnosis present

## 2023-06-12 DIAGNOSIS — I110 Hypertensive heart disease with heart failure: Secondary | ICD-10-CM | POA: Diagnosis present

## 2023-06-12 DIAGNOSIS — Z87892 Personal history of anaphylaxis: Secondary | ICD-10-CM | POA: Diagnosis not present

## 2023-06-12 DIAGNOSIS — T80212A Local infection due to central venous catheter, initial encounter: Secondary | ICD-10-CM | POA: Diagnosis present

## 2023-06-12 DIAGNOSIS — Z7951 Long term (current) use of inhaled steroids: Secondary | ICD-10-CM

## 2023-06-12 DIAGNOSIS — D509 Iron deficiency anemia, unspecified: Secondary | ICD-10-CM | POA: Diagnosis present

## 2023-06-12 DIAGNOSIS — J441 Chronic obstructive pulmonary disease with (acute) exacerbation: Secondary | ICD-10-CM | POA: Diagnosis present

## 2023-06-12 DIAGNOSIS — T80219A Unspecified infection due to central venous catheter, initial encounter: Secondary | ICD-10-CM | POA: Diagnosis present

## 2023-06-12 DIAGNOSIS — K219 Gastro-esophageal reflux disease without esophagitis: Secondary | ICD-10-CM | POA: Diagnosis present

## 2023-06-12 DIAGNOSIS — Y92009 Unspecified place in unspecified non-institutional (private) residence as the place of occurrence of the external cause: Secondary | ICD-10-CM

## 2023-06-12 DIAGNOSIS — J4489 Other specified chronic obstructive pulmonary disease: Secondary | ICD-10-CM | POA: Diagnosis present

## 2023-06-12 DIAGNOSIS — I25118 Atherosclerotic heart disease of native coronary artery with other forms of angina pectoris: Secondary | ICD-10-CM | POA: Diagnosis not present

## 2023-06-12 DIAGNOSIS — Z888 Allergy status to other drugs, medicaments and biological substances status: Secondary | ICD-10-CM

## 2023-06-12 DIAGNOSIS — Z7952 Long term (current) use of systemic steroids: Secondary | ICD-10-CM

## 2023-06-12 DIAGNOSIS — A419 Sepsis, unspecified organism: Secondary | ICD-10-CM | POA: Diagnosis present

## 2023-06-12 DIAGNOSIS — Z87891 Personal history of nicotine dependence: Secondary | ICD-10-CM

## 2023-06-12 DIAGNOSIS — I5032 Chronic diastolic (congestive) heart failure: Secondary | ICD-10-CM | POA: Diagnosis present

## 2023-06-12 DIAGNOSIS — Z951 Presence of aortocoronary bypass graft: Secondary | ICD-10-CM

## 2023-06-12 DIAGNOSIS — J9600 Acute respiratory failure, unspecified whether with hypoxia or hypercapnia: Secondary | ICD-10-CM | POA: Diagnosis not present

## 2023-06-12 DIAGNOSIS — Z79899 Other long term (current) drug therapy: Secondary | ICD-10-CM

## 2023-06-12 DIAGNOSIS — Z880 Allergy status to penicillin: Secondary | ICD-10-CM

## 2023-06-12 DIAGNOSIS — I4510 Unspecified right bundle-branch block: Secondary | ICD-10-CM | POA: Diagnosis present

## 2023-06-12 DIAGNOSIS — D6489 Other specified anemias: Secondary | ICD-10-CM | POA: Diagnosis present

## 2023-06-12 DIAGNOSIS — X58XXXA Exposure to other specified factors, initial encounter: Secondary | ICD-10-CM | POA: Diagnosis present

## 2023-06-12 DIAGNOSIS — J9621 Acute and chronic respiratory failure with hypoxia: Secondary | ICD-10-CM | POA: Diagnosis present

## 2023-06-12 DIAGNOSIS — Z66 Do not resuscitate: Secondary | ICD-10-CM | POA: Diagnosis not present

## 2023-06-12 DIAGNOSIS — T80211A Bloodstream infection due to central venous catheter, initial encounter: Principal | ICD-10-CM | POA: Diagnosis present

## 2023-06-12 DIAGNOSIS — Z1152 Encounter for screening for COVID-19: Secondary | ICD-10-CM

## 2023-06-12 DIAGNOSIS — C7989 Secondary malignant neoplasm of other specified sites: Secondary | ICD-10-CM | POA: Diagnosis present

## 2023-06-12 DIAGNOSIS — I4892 Unspecified atrial flutter: Secondary | ICD-10-CM | POA: Diagnosis present

## 2023-06-12 DIAGNOSIS — E222 Syndrome of inappropriate secretion of antidiuretic hormone: Secondary | ICD-10-CM | POA: Diagnosis present

## 2023-06-12 DIAGNOSIS — C3491 Malignant neoplasm of unspecified part of right bronchus or lung: Secondary | ICD-10-CM | POA: Diagnosis present

## 2023-06-12 DIAGNOSIS — Z955 Presence of coronary angioplasty implant and graft: Secondary | ICD-10-CM

## 2023-06-12 DIAGNOSIS — Z7982 Long term (current) use of aspirin: Secondary | ICD-10-CM

## 2023-06-12 DIAGNOSIS — Z79620 Long term (current) use of immunosuppressive biologic: Secondary | ICD-10-CM

## 2023-06-12 DIAGNOSIS — J8410 Pulmonary fibrosis, unspecified: Secondary | ICD-10-CM | POA: Diagnosis present

## 2023-06-12 DIAGNOSIS — Z9981 Dependence on supplemental oxygen: Secondary | ICD-10-CM | POA: Diagnosis not present

## 2023-06-12 DIAGNOSIS — L03313 Cellulitis of chest wall: Secondary | ICD-10-CM | POA: Diagnosis present

## 2023-06-12 DIAGNOSIS — D649 Anemia, unspecified: Secondary | ICD-10-CM | POA: Diagnosis not present

## 2023-06-12 DIAGNOSIS — Z7902 Long term (current) use of antithrombotics/antiplatelets: Secondary | ICD-10-CM

## 2023-06-12 DIAGNOSIS — I480 Paroxysmal atrial fibrillation: Secondary | ICD-10-CM | POA: Diagnosis present

## 2023-06-12 DIAGNOSIS — C3490 Malignant neoplasm of unspecified part of unspecified bronchus or lung: Secondary | ICD-10-CM | POA: Diagnosis present

## 2023-06-12 LAB — COMPREHENSIVE METABOLIC PANEL
ALT: 18 U/L (ref 0–44)
AST: 20 U/L (ref 15–41)
Albumin: 3.6 g/dL (ref 3.5–5.0)
Alkaline Phosphatase: 106 U/L (ref 38–126)
Anion gap: 13 (ref 5–15)
BUN: 10 mg/dL (ref 8–23)
CO2: 25 mmol/L (ref 22–32)
Calcium: 7.6 mg/dL — ABNORMAL LOW (ref 8.9–10.3)
Chloride: 95 mmol/L — ABNORMAL LOW (ref 98–111)
Creatinine, Ser: 0.69 mg/dL (ref 0.61–1.24)
GFR, Estimated: >60 mL/min (ref >60)
Glucose, Bld: 144 mg/dL — ABNORMAL HIGH (ref 70–99)
Potassium: 3.3 mmol/L — ABNORMAL LOW (ref 3.5–5.1)
Sodium: 133 mmol/L — ABNORMAL LOW (ref 135–145)
Total Bilirubin: 0.7 mg/dL (ref 0.3–1.2)
Total Protein: 7 g/dL (ref 6.5–8.1)

## 2023-06-12 LAB — CBC WITH DIFFERENTIAL/PLATELET
Abs Immature Granulocytes: 1.31 10*3/uL — ABNORMAL HIGH (ref 0.00–0.07)
Basophils Absolute: 0.1 10*3/uL (ref 0.0–0.1)
Basophils Relative: 1 %
Eosinophils Absolute: 0 10*3/uL (ref 0.0–0.5)
Eosinophils Relative: 0 %
HCT: 28.7 % — ABNORMAL LOW (ref 39.0–52.0)
Hemoglobin: 9 g/dL — ABNORMAL LOW (ref 13.0–17.0)
Immature Granulocytes: 6 %
Lymphocytes Relative: 4 %
Lymphs Abs: 0.9 10*3/uL (ref 0.7–4.0)
MCH: 27.5 pg (ref 26.0–34.0)
MCHC: 31.4 g/dL (ref 30.0–36.0)
MCV: 87.8 fL (ref 80.0–100.0)
Monocytes Absolute: 1.5 10*3/uL — ABNORMAL HIGH (ref 0.1–1.0)
Monocytes Relative: 6 %
Neutro Abs: 18.9 10*3/uL — ABNORMAL HIGH (ref 1.7–7.7)
Neutrophils Relative %: 83 %
Platelets: 368 10*3/uL (ref 150–400)
RBC: 3.27 MIL/uL — ABNORMAL LOW (ref 4.22–5.81)
RDW: 18.5 % — ABNORMAL HIGH (ref 11.5–15.5)
Smear Review: NORMAL
WBC: 22.7 10*3/uL — ABNORMAL HIGH (ref 4.0–10.5)
nRBC: 0.2 % (ref 0.0–0.2)

## 2023-06-12 LAB — URINALYSIS, W/ REFLEX TO CULTURE (INFECTION SUSPECTED)
Bacteria, UA: NONE SEEN
Bilirubin Urine: NEGATIVE
Glucose, UA: >=500 mg/dL — AB
Hgb urine dipstick: NEGATIVE
Ketones, ur: NEGATIVE mg/dL
Leukocytes,Ua: NEGATIVE
Nitrite: NEGATIVE
Protein, ur: NEGATIVE mg/dL
Specific Gravity, Urine: 1.021 (ref 1.005–1.030)
Squamous Epithelial / HPF: NONE SEEN /HPF (ref 0–5)
pH: 5 (ref 5.0–8.0)

## 2023-06-12 LAB — RESP PANEL BY RT-PCR (RSV, FLU A&B, COVID)  RVPGX2
Influenza A by PCR: NEGATIVE
Influenza B by PCR: NEGATIVE
Resp Syncytial Virus by PCR: NEGATIVE
SARS Coronavirus 2 by RT PCR: NEGATIVE

## 2023-06-12 LAB — LACTIC ACID, PLASMA
Lactic Acid, Venous: 1.2 mmol/L (ref 0.5–1.9)
Lactic Acid, Venous: 1.1 mmol/L (ref 0.5–1.9)

## 2023-06-12 LAB — TYPE AND SCREEN
ABO/RH(D): O POS
Antibody Screen: NEGATIVE

## 2023-06-12 LAB — PROTIME-INR
INR: 1.2 (ref 0.8–1.2)
Prothrombin Time: 14.9 seconds (ref 11.4–15.2)

## 2023-06-12 LAB — APTT: aPTT: 40 seconds — ABNORMAL HIGH (ref 24–36)

## 2023-06-12 MED ORDER — ATORVASTATIN CALCIUM 20 MG PO TABS
40.0000 mg | ORAL_TABLET | Freq: Every day | ORAL | Status: DC
  Administered 2023-06-12 – 2023-06-21 (×10): 40 mg via ORAL
  Filled 2023-06-12 (×10): qty 2

## 2023-06-12 MED ORDER — VANCOMYCIN HCL IN DEXTROSE 1-5 GM/200ML-% IV SOLN: 1000.0000 mg | Freq: Once | INTRAVENOUS | Status: DC

## 2023-06-12 MED ORDER — ENOXAPARIN SODIUM 60 MG/0.6ML IJ SOSY
50.0000 mg | PREFILLED_SYRINGE | INTRAMUSCULAR | Status: DC
  Administered 2023-06-12 – 2023-06-22 (×10): 50 mg via SUBCUTANEOUS
  Filled 2023-06-12 (×10): qty 0.6

## 2023-06-12 MED ORDER — SODIUM CHLORIDE 0.9 % IV BOLUS (SEPSIS)
1000.0000 mL | Freq: Once | INTRAVENOUS | Status: AC
  Administered 2023-06-12: 1000 mL via INTRAVENOUS

## 2023-06-12 MED ORDER — SODIUM CHLORIDE 0.9 % IV SOLN
2.0000 g | Freq: Once | INTRAVENOUS | Status: AC
  Administered 2023-06-12: 2 g via INTRAVENOUS
  Filled 2023-06-12: qty 12.5

## 2023-06-12 MED ORDER — HYDROCOD POLI-CHLORPHE POLI ER 10-8 MG/5ML PO SUER
5.0000 mL | Freq: Every evening | ORAL | Status: DC | PRN
  Administered 2023-06-13 – 2023-06-14 (×2): 5 mL via ORAL
  Filled 2023-06-12 (×3): qty 5

## 2023-06-12 MED ORDER — PANTOPRAZOLE SODIUM 40 MG PO TBEC
40.0000 mg | DELAYED_RELEASE_TABLET | Freq: Two times a day (BID) | ORAL | Status: DC
  Administered 2023-06-12 – 2023-06-22 (×20): 40 mg via ORAL
  Filled 2023-06-12 (×20): qty 1

## 2023-06-12 MED ORDER — GUAIFENESIN-DM 100-10 MG/5ML PO SYRP
5.0000 mL | ORAL_SOLUTION | ORAL | Status: DC | PRN
  Administered 2023-06-12 – 2023-06-22 (×8): 5 mL via ORAL
  Filled 2023-06-12 (×8): qty 10

## 2023-06-12 MED ORDER — ONDANSETRON HCL 4 MG PO TABS: 8.0000 mg | ORAL_TABLET | Freq: Three times a day (TID) | ORAL | Status: DC | PRN

## 2023-06-12 MED ORDER — OXYCODONE HCL 5 MG PO TABS
5.0000 mg | ORAL_TABLET | Freq: Four times a day (QID) | ORAL | Status: DC | PRN
  Administered 2023-06-13 – 2023-06-20 (×10): 5 mg via ORAL
  Filled 2023-06-12 (×11): qty 1

## 2023-06-12 MED ORDER — VANCOMYCIN HCL 750 MG/150ML IV SOLN
750.0000 mg | Freq: Two times a day (BID) | INTRAVENOUS | Status: DC
  Administered 2023-06-13 – 2023-06-16 (×8): 750 mg via INTRAVENOUS
  Filled 2023-06-12 (×9): qty 150

## 2023-06-12 MED ORDER — ENOXAPARIN SODIUM 40 MG/0.4ML IJ SOSY: 40.0000 mg | PREFILLED_SYRINGE | INTRAMUSCULAR | Status: DC

## 2023-06-12 MED ORDER — FERROUS SULFATE 325 (65 FE) MG PO TABS
325.0000 mg | ORAL_TABLET | Freq: Two times a day (BID) | ORAL | Status: DC
  Administered 2023-06-12 – 2023-06-22 (×19): 325 mg via ORAL
  Filled 2023-06-12 (×19): qty 1

## 2023-06-12 MED ORDER — METOPROLOL TARTRATE 5 MG/5ML IV SOLN
5.0000 mg | INTRAVENOUS | Status: DC | PRN
  Administered 2023-06-14 – 2023-06-15 (×2): 5 mg via INTRAVENOUS
  Filled 2023-06-12 (×2): qty 5

## 2023-06-12 MED ORDER — ALBUTEROL SULFATE HFA 108 (90 BASE) MCG/ACT IN AERS: 2.0000 | INHALATION_SPRAY | Freq: Four times a day (QID) | RESPIRATORY_TRACT | Status: DC | PRN

## 2023-06-12 MED ORDER — ALBUTEROL SULFATE (2.5 MG/3ML) 0.083% IN NEBU: 2.5000 mg | INHALATION_SOLUTION | Freq: Four times a day (QID) | RESPIRATORY_TRACT | Status: DC | PRN

## 2023-06-12 MED ORDER — TAMSULOSIN HCL 0.4 MG PO CAPS
0.4000 mg | ORAL_CAPSULE | Freq: Every day | ORAL | Status: DC
  Administered 2023-06-12 – 2023-06-21 (×10): 0.4 mg via ORAL
  Filled 2023-06-12 (×10): qty 1

## 2023-06-12 MED ORDER — LEVOFLOXACIN IN D5W 750 MG/150ML IV SOLN: 750.0000 mg | Freq: Once | INTRAVENOUS | Status: DC

## 2023-06-12 MED ORDER — AZITHROMYCIN 250 MG PO TABS
500.0000 mg | ORAL_TABLET | Freq: Every day | ORAL | Status: DC
  Administered 2023-06-13 – 2023-06-16 (×4): 500 mg via ORAL
  Filled 2023-06-12 (×4): qty 2

## 2023-06-12 MED ORDER — METOPROLOL TARTRATE 25 MG PO TABS
25.0000 mg | ORAL_TABLET | Freq: Two times a day (BID) | ORAL | Status: DC
  Administered 2023-06-12 – 2023-06-16 (×7): 25 mg via ORAL
  Filled 2023-06-12 (×7): qty 1

## 2023-06-12 MED ORDER — VANCOMYCIN HCL 2000 MG/400ML IV SOLN
2000.0000 mg | Freq: Once | INTRAVENOUS | Status: AC
  Administered 2023-06-12: 2000 mg via INTRAVENOUS
  Filled 2023-06-12: qty 400

## 2023-06-12 MED ORDER — IPRATROPIUM-ALBUTEROL 0.5-2.5 (3) MG/3ML IN SOLN
3.0000 mL | Freq: Four times a day (QID) | RESPIRATORY_TRACT | Status: DC
  Administered 2023-06-12 – 2023-06-16 (×16): 3 mL via RESPIRATORY_TRACT
  Filled 2023-06-12 (×15): qty 3

## 2023-06-12 MED ORDER — PROCHLORPERAZINE MALEATE 10 MG PO TABS: 10.0000 mg | ORAL_TABLET | Freq: Four times a day (QID) | ORAL | Status: DC | PRN

## 2023-06-12 MED ORDER — SODIUM CHLORIDE 0.9 % IV SOLN
500.0000 mg | Freq: Once | INTRAVENOUS | Status: AC
  Administered 2023-06-12: 500 mg via INTRAVENOUS
  Filled 2023-06-12: qty 5

## 2023-06-12 MED ORDER — SENNOSIDES-DOCUSATE SODIUM 8.6-50 MG PO TABS
1.0000 | ORAL_TABLET | Freq: Two times a day (BID) | ORAL | Status: DC
  Administered 2023-06-12 – 2023-06-22 (×14): 1 via ORAL
  Filled 2023-06-12 (×20): qty 1

## 2023-06-12 MED ORDER — ACETAMINOPHEN 325 MG PO TABS
650.0000 mg | ORAL_TABLET | Freq: Once | ORAL | Status: AC
  Administered 2023-06-12: 650 mg via ORAL
  Filled 2023-06-12: qty 2

## 2023-06-12 MED ORDER — ASPIRIN 81 MG PO TBEC: 81.0000 mg | DELAYED_RELEASE_TABLET | Freq: Every day | ORAL | Status: DC

## 2023-06-12 MED ORDER — MONTELUKAST SODIUM 10 MG PO TABS
10.0000 mg | ORAL_TABLET | Freq: Every day | ORAL | Status: DC
  Administered 2023-06-12 – 2023-06-22 (×11): 10 mg via ORAL
  Filled 2023-06-12 (×11): qty 1

## 2023-06-12 MED ORDER — GABAPENTIN 300 MG PO CAPS
300.0000 mg | ORAL_CAPSULE | Freq: Three times a day (TID) | ORAL | Status: DC
  Administered 2023-06-12 – 2023-06-22 (×28): 300 mg via ORAL
  Filled 2023-06-12 (×29): qty 1

## 2023-06-12 MED ORDER — DILTIAZEM HCL 25 MG/5ML IV SOLN
10.0000 mg | Freq: Once | INTRAVENOUS | Status: AC
  Administered 2023-06-12: 10 mg via INTRAVENOUS
  Filled 2023-06-12: qty 5

## 2023-06-12 MED ORDER — ACETAMINOPHEN 500 MG PO TABS
1000.0000 mg | ORAL_TABLET | Freq: Four times a day (QID) | ORAL | Status: DC | PRN
  Administered 2023-06-12 – 2023-06-22 (×20): 1000 mg via ORAL
  Filled 2023-06-12 (×20): qty 2

## 2023-06-12 MED ORDER — CLOPIDOGREL BISULFATE 75 MG PO TABS: 75.0000 mg | ORAL_TABLET | Freq: Every day | ORAL | Status: DC

## 2023-06-12 MED ORDER — MOMETASONE FURO-FORMOTEROL FUM 200-5 MCG/ACT IN AERO
2.0000 | INHALATION_SPRAY | Freq: Two times a day (BID) | RESPIRATORY_TRACT | Status: DC
  Administered 2023-06-13 – 2023-06-22 (×20): 2 via RESPIRATORY_TRACT
  Filled 2023-06-12: qty 8.8

## 2023-06-12 MED ORDER — SODIUM CHLORIDE 0.9 % IV SOLN
2.0000 g | INTRAVENOUS | Status: DC
  Administered 2023-06-12 – 2023-06-16 (×5): 2 g via INTRAVENOUS
  Filled 2023-06-12 (×5): qty 20

## 2023-06-12 NOTE — ED Notes (Signed)
Area over port in right chest is red and warm to touch , redness extends down to nipple line

## 2023-06-12 NOTE — Sepsis Progress Note (Signed)
Sepsis protocol is being followed by [facility]. 

## 2023-06-12 NOTE — Progress Notes (Signed)
Pharmacy Antibiotic Note  Austin Patterson is a 67 y.o. male admitted on 06/12/2023 with CAP and wound infection.  Pharmacy has been consulted for Vancomycin dosing and monitoring. Patient is also receiving ceftriaxone every 24 hours.   Plan: Patient received vancomycin 2g IV loading dose in ED.   Start Vancomycin 750 mg IV Q 12 hrs. Goal AUC 400-550. Expected AUC: 467 SCr used: 0.8 Vd: 0.5   Pharmacy will continue to monitor and adjust as needed.    Temp (24hrs), Avg:100.3 F (37.9 C), Min:100.3 F (37.9 C), Max:100.3 F (37.9 C)  Recent Labs  Lab 06/08/23 1104 06/12/23 1020  WBC 11.7* 22.7*  CREATININE 0.97 0.69  LATICACIDVEN  --  1.2    Estimated Creatinine Clearance: 94 mL/min (by C-G formula based on SCr of 0.69 mg/dL).    Allergies  Allergen Reactions   Amoxicillin Anaphylaxis   Tizanidine     Feet and ankle swell     Antimicrobials this admission: 8/24 Azithromycin  >> x1 8/24 Ceftriaxone  >>  8/24 Vancomycin>>   Microbiology results: 8/24 BCx: Pending 8/24 MRSA PCR: pending  Thank you for allowing pharmacy to be a part of this patient's care.  Gardner Candle, PharmD, BCPS Clinical Pharmacist 06/12/2023 12:45 PM

## 2023-06-12 NOTE — Progress Notes (Signed)
PHARMACIST - PHYSICIAN COMMUNICATION  CONCERNING:  Enoxaparin (Lovenox) for DVT Prophylaxis    RECOMMENDATION: Patient was prescribed enoxaprin 40mg  q24 hours for VTE prophylaxis.   There were no vitals filed for this visit.  There is no height or weight on file to calculate BMI.  Estimated Creatinine Clearance: 94 mL/min (by C-G formula based on SCr of 0.69 mg/dL).  Based on [facility] policy patient is candidate for enoxaparin 0.5mg /kg TBW SQ every 24 hours based on BMI being >30.  DESCRIPTION: Pharmacy has adjusted enoxaparin dose per [facility] policy.  Patient is now receiving enoxaparin 50 mg every 24 hours    Effie Shy, PharmD PGY1 Pharmacy Resident 06/12/2023 12:11 PM

## 2023-06-12 NOTE — Progress Notes (Signed)
PHARMACY -  BRIEF ANTIBIOTIC NOTE   Pharmacy has received consult(s) for Levofloxacin and vancomycin  from an ED provider.  The patient's profile has been reviewed for ht/wt/allergies/indication/available labs.     Pharmacist to investigate beta-lactam allergy. If history of intolerance, mild allergy, or documented history of use of cephalosporins, pharmacy can adjust levofloxacin to cefepime and azithromycin.   Patient has tolerated multiple cephalosporins in past (cefepime, ceftriaxone, and cefuroxine)  One time order(s) placed for Cefepime 2g, Vancomycin 2000mg , and azithromycin 500mg   Further antibiotics/pharmacy consults should be ordered by admitting physician if indicated.                       Thank you, Gardner Candle, PharmD, BCPS Clinical Pharmacist 06/12/2023 10:08 AM

## 2023-06-12 NOTE — Consult Note (Signed)
Subjective:   CC: Infected port  HPI:  Austin Patterson is a 67 y.o. male who was consulted by Chipper Herb for evaluation of above.  First noted a few weeks ago.  Symptoms include: Pain is minimal, localized around port.  Exacerbated by touch.  Alleviated by nothing specific.  Associated with some redness around the area.     Past Medical History:  has a past medical history of Abdominal pain (12/07/2022), Acute urinary retention (12/07/2022), Chest pain (01/24/2022), COPD (chronic obstructive pulmonary disease) (HCC), GERD (gastroesophageal reflux disease), Hemothorax on right (05/11/2023), Hyperlipidemia, Hypertension, Pleuritic pain (05/10/2023), Pneumothorax after biopsy (05/11/2023), Post procedure discomfort (05/13/2023), and Pulmonary fibrosis (HCC) (11/2015).  Past Surgical History:  has a past surgical history that includes Coronary stent placement; Shoulder acromioplasty; Esophagogastroduodenoscopy (egd) with propofol (N/A, 09/23/2016); Colonoscopy; Kyphoplasty (N/A, 03/14/2020); and IR IMAGING GUIDED PORT INSERTION (05/26/2023).  Family History: family history includes Heart disease in his mother.  Social History:  reports that he quit smoking about 7 years ago. His smoking use included cigarettes. He started smoking about 57 years ago. He has a 100 pack-year smoking history. He has quit using smokeless tobacco. He reports current alcohol use of about 56.0 standard drinks of alcohol per week. He reports that he does not use drugs.  Current Medications:  Prior to Admission medications   Medication Sig Start Date End Date Taking? Authorizing Provider  acetaminophen (TYLENOL) 500 MG tablet Take 1,000 mg by mouth every 6 (six) hours as needed for moderate pain or headache.   Yes [provider]  albuterol (VENTOLIN HFA) 108 (90 Base) MCG/ACT inhaler Inhale 2 puffs into the lungs every 6 (six) hours as needed for wheezing or shortness of breath. 01/29/20  Yes Danford, Earl Lites, MD   amLODipine (NORVASC) 10 MG tablet Take 1 tablet (10 mg total) by mouth daily. 08/19/16  Yes Virl Axe, MD  aspirin EC 81 MG tablet Take 1 tablet (81 mg total) by mouth daily. 08/19/16  Yes Virl Axe, MD  atorvastatin (LIPITOR) 40 MG tablet Take 1 tablet (40 mg total) by mouth at bedtime. 08/19/16  Yes Virl Axe, MD  CALCIUM 600/VITAMIN D 600-10 MG-MCG TABS Take 1 tablet by mouth 2 (two) times daily. 11/20/21  Yes [provider]  clopidogrel (PLAVIX) 75 MG tablet Take 1 tablet (75 mg total) by mouth daily. 08/19/16  Yes Virl Axe, MD  dexamethasone (DECADRON) 4 MG tablet Take 2 tablets (8mg ) by mouth daily starting the day after carboplatin for 3 days. Take with food 05/28/23  Yes Michaelyn Barter, MD  Dupilumab (DUPIXENT) 300 MG/2ML SOPN Inject 300 mg into the skin every 14 (fourteen) days. 11/04/22  Yes Salena Saner, MD  EPINEPHrine 0.3 mg/0.3 mL IJ SOAJ injection Inject 0.3 mg into the muscle as needed for anaphylaxis.   Yes [provider]  fluticasone-salmeterol (ADVAIR DISKUS) 250-50 MCG/ACT AEPB Inhale 1 puff into the lungs in the morning and at bedtime. 03/04/22  Yes Salena Saner, MD  gabapentin (NEURONTIN) 300 MG capsule Take 1 capsule (300 mg total) by mouth 3 (three) times daily. 04/27/23  Yes Salena Saner, MD  guaiFENesin-dextromethorphan (ROBITUSSIN DM) 100-10 MG/5ML syrup Take 5 mLs by mouth every 4 (four) hours as needed for cough. 06/01/23  Yes Michaelyn Barter, MD  ipratropium-albuterol (DUONEB) 0.5-2.5 (3) MG/3ML SOLN INHALE 1 VIAL THROUGH NEBULIZER EVERY 6 HOURS 10/01/22  Yes Salena Saner, MD  lidocaine-prilocaine (EMLA) cream Apply to affected area once 05/28/23  Yes Michaelyn Barter, MD  magnesium oxide (MAG-OX) 400 (241.3 Mg) MG tablet Take 1 tablet (400 mg total) by mouth daily. 06/18/17  Yes Sudini, Wardell Heath, MD  metoprolol succinate (TOPROL-XL) 50 MG 24 hr tablet Take 1 tablet (50 mg total) by mouth daily. 08/19/16  Yes Virl Axe,  MD  montelukast (SINGULAIR) 10 MG tablet Take 10 mg by mouth daily. 11/19/21  Yes [provider]  Multiple Vitamin (MULTIVITAMIN WITH MINERALS) TABS tablet Take 1 tablet by mouth daily. 09/24/16  Yes Enedina Finner, MD  ondansetron (ZOFRAN) 8 MG tablet Take 1 tablet (8 mg total) by mouth every 8 (eight) hours as needed for nausea or vomiting. Start on the third day after carboplatin. 05/28/23  Yes Michaelyn Barter, MD  oxyCODONE (OXY IR/ROXICODONE) 5 MG immediate release tablet Take 1 tablet (5 mg total) by mouth every 6 (six) hours as needed for severe pain. 06/08/23  Yes Alinda Dooms, NP  pantoprazole (PROTONIX) 40 MG tablet Take 1 tablet (40 mg total) by mouth 2 (two) times daily. 09/23/16  Yes Enedina Finner, MD  predniSONE (DELTASONE) 10 MG tablet Take 1 tablet (10 mg total) by mouth daily as needed. Take as directed. 04/27/23  Yes Salena Saner, MD  prochlorperazine (COMPAZINE) 10 MG tablet Take 1 tablet (10 mg total) by mouth every 6 (six) hours as needed for nausea or vomiting. 05/28/23  Yes Michaelyn Barter, MD  senna-docusate (SENOKOT-S) 8.6-50 MG tablet Take 1 tablet by mouth 2 (two) times daily. 12/09/22  Yes Esaw Grandchild A, DO  chlorpheniramine-HYDROcodone (TUSSIONEX) 10-8 MG/5ML Take 5 mLs by mouth at bedtime as needed for cough. Patient not taking: Reported on 06/12/2023 04/05/23   Sunnie Nielsen, DO  KLOR-CON M20 20 MEQ tablet Take 20 mEq by mouth daily. Patient not taking: Reported on 06/12/2023 11/20/21   [provider]  Spacer/Aero-Holding Chambers (AEROCHAMBER MV) inhaler Use as instructed 10/05/19   Salena Saner, MD  tamsulosin (FLOMAX) 0.4 MG CAPS capsule Take 1 capsule (0.4 mg total) by mouth daily after supper. Patient not taking: Reported on 06/12/2023 12/09/22   Esaw Grandchild A, DO    Allergies:  Allergies  Allergen Reactions   Amoxicillin Anaphylaxis   Tizanidine     Feet and ankle swell     ROS:  General: Denies weight loss, weight gain, fatigue,  fevers, chills, and night sweats. Eyes: Denies blurry vision, double vision, eye pain, itchy eyes, and tearing. Ears: Denies hearing loss, earache, and ringing in ears. Nose: Denies sinus pain, congestion, infections, runny nose, and nosebleeds. Mouth/throat: Denies hoarseness, sore throat, bleeding gums, and difficulty swallowing. Heart: Denies chest pain, palpitations, racing heart, irregular heartbeat, leg pain or swelling, and decreased activity tolerance. Respiratory: Denies breathing difficulty, shortness of breath, wheezing, cough, and sputum. GI: Denies change in appetite, heartburn, nausea, vomiting, constipation, diarrhea, and blood in stool. GU: Denies difficulty urinating, pain with urinating, urgency, frequency, blood in urine. Musculoskeletal: Denies joint stiffness, pain, swelling, muscle weakness. Skin: Denies rash, itching, mass, tumors, sores, and boils Neurologic: Denies headache, fainting, dizziness, seizures, numbness, and tingling. Psychiatric: Denies depression, anxiety, difficulty sleeping, and memory loss. Endocrine: Denies heat or cold intolerance, and increased thirst or urination. Blood/lymph: Denies easy bruising, easy bruising, and swollen glands     Objective:     BP (!) 141/72   Pulse (!) 106   Temp 100.3 F (37.9 C) (Oral)   Resp (!) 26   SpO2 91%   Constitutional :  alert, cooperative, appears stated  age, and no distress  Lymphatics/Throat:  no asymmetry, masses, or scars  Respiratory:  clear to auscultation bilaterally  Cardiovascular:  regular rate and rhythm  Gastrointestinal: soft, non-tender; bowel sounds normal; no masses,  no organomegaly.  Musculoskeletal: Steady gait and movement  Skin: Cool and moist.  Right chest wall with obviously infected port site.  No obvious induration or drainage at this time  Psychiatric: Normal affect, non-agitated, not confused       LABS:     Latest Ref Rng & Units 06/12/2023   10:20 AM 06/08/2023   11:04  AM 06/01/2023    8:38 AM  CMP  Glucose 70 - 99 mg/dL 295  188  416   BUN 8 - 23 mg/dL 10  16  18    Creatinine 0.61 - 1.24 mg/dL 6.06  3.01  6.01   Sodium 135 - 145 mmol/L 133  130  123   Potassium 3.5 - 5.1 mmol/L 3.3  3.3  3.4   Chloride 98 - 111 mmol/L 95  95  88   CO2 22 - 32 mmol/L 25  26  25    Calcium 8.9 - 10.3 mg/dL 7.6  8.8  8.7   Total Protein 6.5 - 8.1 g/dL 7.0  6.9  7.1   Total Bilirubin 0.3 - 1.2 mg/dL 0.7  0.4  0.3   Alkaline Phos 38 - 126 U/L 106  90  82   AST 15 - 41 U/L 20  29  28    ALT 0 - 44 U/L 18  24  20        Latest Ref Rng & Units 06/12/2023   10:20 AM 06/08/2023   11:04 AM 06/01/2023    8:38 AM  CBC  WBC 4.0 - 10.5 K/uL 22.7  11.7  7.6   Hemoglobin 13.0 - 17.0 g/dL 9.0  8.6  9.1   Hematocrit 39.0 - 52.0 % 28.7  27.4  28.7   Platelets 150 - 400 K/uL 368  286  340     RADS: N/a  Assessment:      Infected port  Plan:     Due to recent Plavix use, will start with IV antibiotics and  monitor for few days, before proceeding with removal due to an extremely high risk of bleeding complications.  Will continue to monitor closely and if more urgent intervention is needed, we will proceed at that time.   The patient verbalized understanding and all questions were answered to the patient's satisfaction.  labs/images/medications/previous chart entries reviewed personally and relevant changes/updates noted above.

## 2023-06-12 NOTE — H&P (Addendum)
History and Physical    Austin Patterson QMV:[id number] DOB: Sep 23, 1956 DOA: 06/12/2023  PCP: Pcp, No (Confirm with patient/family/NH records and if not entered, this has to be entered at [facility] point of entry) Patient coming from: Home  I have personally briefly reviewed patient's old medical records in [facility]  Chief Complaint: Fever, pain and rash on chest, cough wheezing  HPI: Austin Patterson is a 66 y.o. male with medical history significant of stage IV lung cancer on chemotherapy, asthma/COPD, pulmonary fibrosis, chronic HFpEF, CAD with stenting in 2010, HTN, came with multiple complaints including worsening of right-sided chest Port-A-Cath insertion site, new onset fever, and worsening of cough and wheezing.  Patient was diagnosed with stage IV lung cancer summer and started chemotherapy 3 weeks ago.  Before initiation of chemotherapy, Port-A-Cath was placed on right side of his chest.  Patient went through the first session of chemotherapy with no plan however he started noticed rash and pain and tender on the Port-A-Cath location.  2 days ago he started to have a dry cough and wheezing, when he increased his breathing medications and the last night he started to have fever and chills and decided to come to the hospital.  ED Course: Fever 100.3, tachycardia was found in A-fib with RVR heart rate 170-190 and responded to IV bolus of Cardizem, nonhypotensive nonhypoxic.  X-ray shows no acute infiltrates but no mass on the right lung base.  Blood work showed WBC 22.7, hemoglobin 11.0, creatinine 0.6.  The patient was started on vancomycin and cefepime and zithromax  Review of Systems: As per HPI otherwise 14 point review of systems negative.    Past Medical History:  Diagnosis Date   Abdominal pain 12/07/2022   Acute urinary retention 12/07/2022   Chest pain 01/24/2022   COPD (chronic obstructive pulmonary disease) (HCC)    GERD (gastroesophageal reflux disease)     Hemothorax on right 05/11/2023   Hyperlipidemia    Hypertension    Pleuritic pain 05/10/2023   Pneumothorax after biopsy 05/11/2023   Post procedure discomfort 05/13/2023   Pulmonary fibrosis (HCC) 11/2015    Past Surgical History:  Procedure Laterality Date   COLONOSCOPY     CORONARY STENT PLACEMENT     ESOPHAGOGASTRODUODENOSCOPY (EGD) WITH PROPOFOL N/A 09/23/2016   Procedure: ESOPHAGOGASTRODUODENOSCOPY (EGD) WITH PROPOFOL;  Surgeon: Wyline Mood, MD;  Location: [facility] ENDOSCOPY;  Service: Endoscopy;  Laterality: N/A;   IR IMAGING GUIDED PORT INSERTION  05/26/2023   KYPHOPLASTY N/A 03/14/2020   Procedure: T7 & T11 KYPHOPLASTY;  Surgeon: Kennedy Bucker, MD;  Location: [facility] ORS;  Service: Orthopedics;  Laterality: N/A;   SHOULDER ACROMIOPLASTY       reports that he quit smoking about 7 years ago. His smoking use included cigarettes. He started smoking about 57 years ago. He has a 100 pack-year smoking history. He has quit using smokeless tobacco. He reports current alcohol use of about 56.0 standard drinks of alcohol per week. He reports that he does not use drugs.  Allergies  Allergen Reactions   Amoxicillin Anaphylaxis   Tizanidine     Feet and ankle swell     Family History  Problem Relation Age of Onset   Heart disease Mother      Prior to Admission medications   Medication Sig Start Date End Date Taking? Authorizing Provider  acetaminophen (TYLENOL) 500 MG tablet Take 1,000 mg by mouth every 6 (six) hours as needed for moderate pain or headache.   Yes [provider]  albuterol (VENTOLIN HFA) 108 (90 Base) MCG/ACT inhaler Inhale 2 puffs into the lungs every 6 (six) hours as needed for wheezing or shortness of breath. 01/29/20  Yes Danford, Earl Lites, MD  amLODipine (NORVASC) 10 MG tablet Take 1 tablet (10 mg total) by mouth daily. 08/19/16  Yes Virl Axe, MD  aspirin EC 81 MG tablet Take 1 tablet (81 mg total) by mouth daily. 08/19/16  Yes Virl Axe, MD   atorvastatin (LIPITOR) 40 MG tablet Take 1 tablet (40 mg total) by mouth at bedtime. 08/19/16  Yes Virl Axe, MD  CALCIUM 600/VITAMIN D 600-10 MG-MCG TABS Take 1 tablet by mouth 2 (two) times daily. 11/20/21  Yes [provider]  clopidogrel (PLAVIX) 75 MG tablet Take 1 tablet (75 mg total) by mouth daily. 08/19/16  Yes Virl Axe, MD  dexamethasone (DECADRON) 4 MG tablet Take 2 tablets (8mg ) by mouth daily starting the day after carboplatin for 3 days. Take with food 05/28/23  Yes Michaelyn Barter, MD  Dupilumab (DUPIXENT) 300 MG/2ML SOPN Inject 300 mg into the skin every 14 (fourteen) days. 11/04/22  Yes Salena Saner, MD  EPINEPHrine 0.3 mg/0.3 mL IJ SOAJ injection Inject 0.3 mg into the muscle as needed for anaphylaxis.   Yes [provider]  fluticasone-salmeterol (ADVAIR DISKUS) 250-50 MCG/ACT AEPB Inhale 1 puff into the lungs in the morning and at bedtime. 03/04/22  Yes Salena Saner, MD  gabapentin (NEURONTIN) 300 MG capsule Take 1 capsule (300 mg total) by mouth 3 (three) times daily. 04/27/23  Yes Salena Saner, MD  guaiFENesin-dextromethorphan (ROBITUSSIN DM) 100-10 MG/5ML syrup Take 5 mLs by mouth every 4 (four) hours as needed for cough. 06/01/23  Yes Michaelyn Barter, MD  ipratropium-albuterol (DUONEB) 0.5-2.5 (3) MG/3ML SOLN INHALE 1 VIAL THROUGH NEBULIZER EVERY 6 HOURS 10/01/22  Yes Salena Saner, MD  lidocaine-prilocaine (EMLA) cream Apply to affected area once 05/28/23  Yes Michaelyn Barter, MD  magnesium oxide (MAG-OX) 400 (241.3 Mg) MG tablet Take 1 tablet (400 mg total) by mouth daily. 06/18/17  Yes Sudini, Wardell Heath, MD  metoprolol succinate (TOPROL-XL) 50 MG 24 hr tablet Take 1 tablet (50 mg total) by mouth daily. 08/19/16  Yes Virl Axe, MD  montelukast (SINGULAIR) 10 MG tablet Take 10 mg by mouth daily. 11/19/21  Yes [provider]  Multiple Vitamin (MULTIVITAMIN WITH MINERALS) TABS tablet Take 1 tablet by mouth daily. 09/24/16  Yes  Enedina Finner, MD  ondansetron (ZOFRAN) 8 MG tablet Take 1 tablet (8 mg total) by mouth every 8 (eight) hours as needed for nausea or vomiting. Start on the third day after carboplatin. 05/28/23  Yes Michaelyn Barter, MD  oxyCODONE (OXY IR/ROXICODONE) 5 MG immediate release tablet Take 1 tablet (5 mg total) by mouth every 6 (six) hours as needed for severe pain. 06/08/23  Yes Alinda Dooms, NP  pantoprazole (PROTONIX) 40 MG tablet Take 1 tablet (40 mg total) by mouth 2 (two) times daily. 09/23/16  Yes Enedina Finner, MD  predniSONE (DELTASONE) 10 MG tablet Take 1 tablet (10 mg total) by mouth daily as needed. Take as directed. 04/27/23  Yes Salena Saner, MD  prochlorperazine (COMPAZINE) 10 MG tablet Take 1 tablet (10 mg total) by mouth every 6 (six) hours as needed for nausea or vomiting. 05/28/23  Yes Michaelyn Barter, MD  senna-docusate (SENOKOT-S) 8.6-50 MG tablet Take 1 tablet by mouth 2 (two) times daily. 12/09/22  Yes Pennie Banter, DO  chlorpheniramine-HYDROcodone (TUSSIONEX) 10-8 MG/5ML Take 5 mLs by mouth at bedtime as needed for cough. Patient not taking: Reported on 06/12/2023 04/05/23   Sunnie Nielsen, DO  KLOR-CON M20 20 MEQ tablet Take 20 mEq by mouth daily. Patient not taking: Reported on 06/12/2023 11/20/21   [provider]  Spacer/Aero-Holding Chambers (AEROCHAMBER MV) inhaler Use as instructed 10/05/19   Salena Saner, MD  tamsulosin (FLOMAX) 0.4 MG CAPS capsule Take 1 capsule (0.4 mg total) by mouth daily after supper. Patient not taking: Reported on 06/12/2023 12/09/22   Pennie Banter, DO    Physical Exam: Vitals:   06/12/23 1057 06/12/23 1100 06/12/23 1130 06/12/23 1200  BP:  (!) 151/81 133/75 (!) 141/72  Pulse:  (!) 124 (!) 102 (!) 106  Resp:  (!) 26 (!) 27 (!) 26  Temp: 100.3 F (37.9 C)     TempSrc: Oral     SpO2:  (!) 88% 96% 91%    Constitutional: NAD, calm, comfortable Vitals:   06/12/23 1057 06/12/23 1100 06/12/23 1130 06/12/23 1200  BP:  (!)  151/81 133/75 (!) 141/72  Pulse:  (!) 124 (!) 102 (!) 106  Resp:  (!) 26 (!) 27 (!) 26  Temp: 100.3 F (37.9 C)     TempSrc: Oral     SpO2:  (!) 88% 96% 91%   Eyes: PERRL, lids and conjunctivae normal ENMT: Mucous membranes are moist. Posterior pharynx clear of any exudate or lesions.Normal dentition.  Neck: normal, supple, no masses, no thyromegaly Respiratory: clear to auscultation bilaterally, scattered wheezing, no crackles. Increasingrespiratory effort. No accessory muscle use.  Cardiovascular: Regular rate and rhythm, no murmurs / rubs / gallops. No extremity edema. 2+ pedal pulses. No carotid bruits.  Abdomen: no tenderness, no masses palpated. No hepatosplenomegaly. Bowel sounds positive.  Musculoskeletal: no clubbing / cyanosis. No joint deformity upper and lower extremities. Good ROM, no contractures. Normal muscle tone.  Skin: Rash and tenderness on the right chest Port-A-Cath site, positive undulation Neurologic: CN 2-12 grossly intact. Sensation intact, DTR normal. Strength 5/5 in all 4.  Psychiatric: Normal judgment and insight. Alert and oriented x 3. Normal mood.     Labs on Admission: I have personally reviewed following labs and imaging studies  CBC: Recent Labs  Lab 06/08/23 1104 06/12/23 1020  WBC 11.7* 22.7*  NEUTROABS 8.8* 18.9*  HGB 8.6* 9.0*  HCT 27.4* 28.7*  MCV 87.5 87.8  PLT 286 368   Basic Metabolic Panel: Recent Labs  Lab 06/08/23 1104 06/12/23 1020  NA 130* 133*  K 3.3* 3.3*  CL 95* 95*  CO2 26 25  GLUCOSE 172* 144*  BUN 16 10  CREATININE 0.97 0.69  CALCIUM 8.8* 7.6*   GFR: Estimated Creatinine Clearance: 94 mL/min (by C-G formula based on SCr of 0.69 mg/dL). Liver Function Tests: Recent Labs  Lab 06/08/23 1104 06/12/23 1020  AST 29 20  ALT 24 18  ALKPHOS 90 106  BILITOT 0.4 0.7  PROT 6.9 7.0  ALBUMIN 3.7 3.6   No results for input(s): "LIPASE", "AMYLASE" in the last 168 hours. No results for input(s): "AMMONIA" in the last  168 hours. Coagulation Profile: Recent Labs  Lab 06/12/23 1020  INR 1.2   Cardiac Enzymes: No results for input(s): "CKTOTAL", "CKMB", "CKMBINDEX", "TROPONINI" in the last 168 hours. BNP (last 3 results) No results for input(s): "PROBNP" in the last 8760 hours. HbA1C: No results for input(s): "HGBA1C" in the last 72 hours. CBG: No results for input(s): "GLUCAP" in the last  168 hours. Lipid Profile: No results for input(s): "CHOL", "HDL", "LDLCALC", "TRIG", "CHOLHDL", "LDLDIRECT" in the last 72 hours. Thyroid Function Tests: No results for input(s): "TSH", "T4TOTAL", "FREET4", "T3FREE", "THYROIDAB" in the last 72 hours. Anemia Panel: No results for input(s): "VITAMINB12", "FOLATE", "FERRITIN", "TIBC", "IRON", "RETICCTPCT" in the last 72 hours. Urine analysis:    Component Value Date/Time   COLORURINE COLORLESS (A) 12/07/2022 1235   APPEARANCEUR CLEAR (A) 12/07/2022 1235   LABSPEC 1.005 12/07/2022 1235   PHURINE 7.0 12/07/2022 1235   GLUCOSEU NEGATIVE 12/07/2022 1235   HGBUR NEGATIVE 12/07/2022 1235   BILIRUBINUR NEGATIVE 12/07/2022 1235   KETONESUR NEGATIVE 12/07/2022 1235   PROTEINUR NEGATIVE 12/07/2022 1235   NITRITE NEGATIVE 12/07/2022 1235   LEUKOCYTESUR NEGATIVE 12/07/2022 1235    Radiological Exams on Admission: DG Chest Port 1 View  Result Date: 06/12/2023 CLINICAL DATA:  Questionable sepsis. EXAM: PORTABLE CHEST 1 VIEW COMPARISON:  05/11/2023 FINDINGS: Rounded opacity at the right base, a known mass. Interstitial reticulation from emphysema. No acute opacity, effusion, or pneumothorax. Normal heart size and mediastinal contours. Porta catheter with tip at the upper SVC. IMPRESSION: 1. No acute finding when compared to prior. 2. Emphysema and mass at the right lung base. Electronically Signed   By: Tiburcio Pea M.D.   On: 06/12/2023 10:46    EKG: Independently reviewed. Sinus tachy, no acute ST-T changes.  Assessment/Plan Principal Problem:   Sepsis  (HCC) Active Problems:   Acute on chronic respiratory failure with hypoxia (HCC)   Asthma-COPD overlap syndrome   Stage IV adenocarcinoma of lung (HCC)  (please populate well all problems here in Problem List. (For example, if patient is on BP meds at home and you resume or decide to hold them, it is a problem that needs to be her. Same for CAD, COPD, HLD and so on)  Sepsis -Evidenced by fever, leukocytosis, source infection likely right-sided chest abscess and underneath soft tissue infection associated with port cath.  Atypical pneumonia cannot be ruled out at this point either.  Decided to extend coverage with vancomycin, ceftriaxone and azithromycin -Blood culture pending, will send MRSA screening -Discussed with on-call surgeon Dr. Tonna Boehringer, who also talked to the on-call IR.  Surgeon evaluated patient at bedside and impression is bleeding risk is high if removing the Port-A-Cath today, and surgeon recommended IV antibiotics today and he will try to remove the Port-A-Cath tomorrow -BP stable, will hold off maintenance IVF  A-fib with RVR -Resolved, repeat EKG showed sinus rhythm -Cardiology Dr. Tenny Craw was contacted in the ED, who recommended as needed rate control medications -Reviewed patient history found the patient has severe iron deficiency anemia with metastatic lung cancer invading portal vein system, thus relative high risk of bleeding -CHADS=1, and he is on ASA -PRN lopressor for rate control  Acute COPD exacerbation -Antibiotics, p.o. steroid -Breathing treatment -Incentive spirometry -Send atypical pneumonia study  Stage IV lung cancer -Outpatient follow-up with oncology  Chronic heart deficiency anemia -H&H is stable, no signs of active bleeding -Start iron supplement, outpatient follow-up with hematology/oncology  CAD -Continue ASA, hold off Plavix for the portCath removing procedure.   DVT prophylaxis: Lovenox Code Status: Full code Family Communication: None at  bedside Disposition Plan: Sick patient with sepsis and infected right chest port Requiring IV antibiotics and inpatient surgical consult and procedure, expect more than 2 midnight hospital stay Consults called: General surgeon Admission status: PCU   Emeline General MD [facility] Pager [phone]  06/12/2023, 12:40 PM

## 2023-06-12 NOTE — ED Provider Notes (Signed)
[facility] Provider Note    Event Date/Time   First MD Initiated Contact with Patient 06/12/23 [phone]     (approximate)   History   Shortness of Breath   HPI  Austin Patterson is a 67 y.o. male with a history of COPD, reported stage IV lung cancer presents with shortness of breath, he also reports cough and fever.       Physical Exam   Triage Vital Signs: ED Triage Vitals [06/12/23 1003]  Encounter Vitals Group     BP      Systolic BP Percentile      Diastolic BP Percentile      Pulse      Resp      Temp      Temp src      SpO2      Weight      Height      Head Circumference      Peak Flow      Pain Score 0     Pain Loc      Pain Education      Exclude from Growth Chart     Most recent vital signs: Vitals:   06/12/23 1057 06/12/23 1100  BP:  (!) 151/81  Pulse:  (!) 124  Resp:  (!) 26  Temp: 100.3 F (37.9 C)   SpO2:  (!) 88%     General: Awake, EMS treating with nebulizer CV:  Good peripheral perfusion.  Right port site is erythematous inflamed and tender, suspicious for infection Resp:  Increased effort with tachypnea Abd:  No distention.  Other:  No lower extremity swelling or tenderness   ED Results / Procedures / Treatments   Labs (all labs ordered are listed, but only abnormal results are displayed) Labs Reviewed  COMPREHENSIVE METABOLIC PANEL - Abnormal; Notable for the following components:      Result Value   Sodium 133 (*)    Potassium 3.3 (*)    Chloride 95 (*)    Glucose, Bld 144 (*)    Calcium 7.6 (*)    All other components within normal limits  CBC WITH DIFFERENTIAL/PLATELET - Abnormal; Notable for the following components:   WBC 22.7 (*)    RBC 3.27 (*)    Hemoglobin 9.0 (*)    HCT 28.7 (*)    RDW 18.5 (*)    Neutro Abs 18.9 (*)    Monocytes Absolute 1.5 (*)    Abs Immature Granulocytes 1.31 (*)    All other components within normal limits  APTT - Abnormal; Notable for the following  components:   aPTT 40 (*)    All other components within normal limits  RESP PANEL BY RT-PCR (RSV, FLU A&B, COVID)  [id number]  CULTURE, BLOOD (ROUTINE X 2)  CULTURE, BLOOD (ROUTINE X 2)  LACTIC ACID, PLASMA  PROTIME-INR  LACTIC ACID, PLASMA  URINALYSIS, W/ REFLEX TO CULTURE (INFECTION SUSPECTED)     EKG  ED ECG REPORT I, Jene Every, the attending physician, personally viewed and interpreted this ECG.  Date: 06/12/2023  Rhythm: Tachycardia QRS Axis: normal Intervals: Right diminished block ST/T Wave abnormalities: normal Narrative Interpretation: no evidence of acute ischemia    RADIOLOGY Chest x-ray viewed interpret by me, no significant change from prior    PROCEDURES:  Critical Care performed: yes  CRITICAL CARE Performed by: Jene Every   Total critical care time: 30 minutes  Critical care time was exclusive of separately billable procedures and treating other  patients.  Critical care was necessary to treat or prevent imminent or life-threatening deterioration.  Critical care was time spent personally by me on the following activities: development of treatment plan with patient and/or surrogate as well as nursing, discussions with consultants, evaluation of patient's response to treatment, examination of patient, obtaining history from patient or surrogate, ordering and performing treatments and interventions, ordering and review of laboratory studies, ordering and review of radiographic studies, pulse oximetry and re-evaluation of patient's condition.   Procedures   MEDICATIONS ORDERED IN ED: Medications  vancomycin (VANCOREADY) IVPB 2000 mg/400 mL (has no administration in time range)  azithromycin (ZITHROMAX) 500 mg in sodium chloride 0.9 % 250 mL IVPB (500 mg Intravenous New Bag/Given 06/12/23 1032)  sodium chloride 0.9 % bolus 1,000 mL (1,000 mLs Intravenous New Bag/Given 06/12/23 1027)  ceFEPIme (MAXIPIME) 2 g in sodium chloride 0.9 % 100 mL IVPB (0  g Intravenous Stopped 06/12/23 1108)  acetaminophen (TYLENOL) tablet 650 mg (650 mg Oral Given 06/12/23 1025)  diltiazem (CARDIZEM) injection 10 mg (10 mg Intravenous Given 06/12/23 1044)     IMPRESSION / MDM / ASSESSMENT AND PLAN / ED COURSE  I reviewed the triage vital signs and the nursing notes. Patient's presentation is most consistent with acute presentation with potential threat to life or bodily function.  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  Patient also has evidence of infected port, this could be the cause of his fever as well with  COPD exacerbation  ----------------------------------------- 10:41 AM on 06/12/2023 ----------------------------------------- Notified of elevated heart rate in the 180s, EKG confirms rapid heart rate, will give slow push low-dose Cardizem to see if this improves, blood pressure is 130/80.    Heart rate slowed briefly, appeared consistent with atrial fibrillation.  Consulted with cardiology and while speaking with cardiology, patient coughed several times and heart rate improved, Dr. Tenny Craw just recommends observing at this point but okay to use low-dose Cardizem drip as needed   ----------------------------------------- 11:30 AM on 06/12/2023 ----------------------------------------- Discussed with hospitalist for admission      FINAL CLINICAL IMPRESSION(S) / ED DIAGNOSES   Final diagnoses:  Sepsis, due to unspecified organism, unspecified whether acute organ dysfunction present (HCC)  Infected venous access port, initial encounter     Rx / DC Orders   ED Discharge Orders     None        Note:  This document was prepared using Dragon voice recognition  software and may include unintentional dictation errors.   Jene Every, MD 06/12/23 1130

## 2023-06-12 NOTE — ED Triage Notes (Signed)
Pt here from home with c/o resp distress recently dx lung ca and had first round of chemo , is on 2 liters O2 up to 4 on arrival was given nebs and 125 mg solumedrol by ems,

## 2023-06-12 NOTE — Progress Notes (Signed)
CODE SEPSIS - PHARMACY COMMUNICATION  **Broad Spectrum Antibiotics should be administered within 1 hour of Sepsis diagnosis**  Time Code Sepsis Called/Page Received: 4098  Antibiotics Ordered: vancomycin, cefepime, azithromycin   Time of 1st antibiotic administration: 1028  Additional action taken by pharmacy: N/A  If necessary, Name of Provider/Nurse Contacted: N/A   Gardner Candle, PharmD, BCPS Clinical Pharmacist 06/12/2023 10:09 AM

## 2023-06-13 DIAGNOSIS — A419 Sepsis, unspecified organism: Secondary | ICD-10-CM

## 2023-06-13 LAB — BASIC METABOLIC PANEL
Anion gap: 8 (ref 5–15)
BUN: 13 mg/dL (ref 8–23)
CO2: 23 mmol/L (ref 22–32)
Calcium: 6.9 mg/dL — ABNORMAL LOW (ref 8.9–10.3)
Chloride: 101 mmol/L (ref 98–111)
Creatinine, Ser: 0.65 mg/dL (ref 0.61–1.24)
GFR, Estimated: >60 mL/min (ref >60)
Glucose, Bld: 126 mg/dL — ABNORMAL HIGH (ref 70–99)
Potassium: 3.5 mmol/L (ref 3.5–5.1)
Sodium: 132 mmol/L — ABNORMAL LOW (ref 135–145)

## 2023-06-13 LAB — CBC
HCT: 22.5 % — ABNORMAL LOW (ref 39.0–52.0)
Hemoglobin: 7.4 g/dL — ABNORMAL LOW (ref 13.0–17.0)
MCH: 27.7 pg (ref 26.0–34.0)
MCHC: 32.9 g/dL (ref 30.0–36.0)
MCV: 84.3 fL (ref 80.0–100.0)
Platelets: 381 10*3/uL (ref 150–400)
RBC: 2.67 MIL/uL — ABNORMAL LOW (ref 4.22–5.81)
RDW: 18.4 % — ABNORMAL HIGH (ref 11.5–15.5)
WBC: 22.5 10*3/uL — ABNORMAL HIGH (ref 4.0–10.5)
nRBC: 0 % (ref 0.0–0.2)

## 2023-06-13 LAB — HEMOGLOBIN AND HEMATOCRIT, BLOOD
HCT: 25.1 % — ABNORMAL LOW (ref 39.0–52.0)
Hemoglobin: 7.8 g/dL — ABNORMAL LOW (ref 13.0–17.0)

## 2023-06-13 MED ORDER — MUSCLE RUB 10-15 % EX CREA
1.0000 | TOPICAL_CREAM | CUTANEOUS | Status: DC | PRN
  Administered 2023-06-14 – 2023-06-16 (×4): 1 via TOPICAL
  Filled 2023-06-13: qty 85

## 2023-06-13 NOTE — Hospital Course (Addendum)
Austin Patterson is a 67 y.o. male with medical history significant of stage IV lung cancer on chemotherapy, asthma/COPD, pulmonary fibrosis, chronic HFpEF, CAD with stenting in 2010, HTN. Dx stage IV lung cancer this summer, started chemotherapy 3 weeks ago. 2 days ago he started to have a dry cough and wheezing, when he increased his breathing medications. Last night he started to have fever and chills and decided to come to the hospital 08/24.  08/24: to ED from home. Fever 100.3, tachycardia was found in A-fib with RVR heart rate 170-190 and responded to IV bolus of Cardizem, nonhypotensive nonhypoxic.  X-ray shows no acute infiltrates but no mass on the right lung base.  Blood work showed WBC 22.7, hemoglobin 11.0, creatinine 0.6. Started on vanc, cefepime, azithromycin. Surgical consult - hold plavix, abx, consider removal in few days / sooner if needed 08/25: not meeting sepsis criteria this morning, though still significant leukocytosis 22.5. Hgb lower today, check another CBC in evening. Plan port-a-cath removal tomorrow   Consultants:  General surgery   Procedures: None       ASSESSMENT & PLAN:   Principal Problem:   Sepsis (HCC) Active Problems:   Acute on chronic respiratory failure with hypoxia (HCC)   Asthma-COPD overlap syndrome   Stage IV adenocarcinoma of lung (HCC)   Sepsis d/t infection of port-a-cath, POA fever, leukocytosis, source infection likely right-sided chest abscess and underneath soft tissue infection associated with port cath.   Atypical pneumonia cannot be ruled out at this point either.   Extended abx coverage with vancomycin, ceftriaxone and azithromycin Blood culture pending send MRSA screening General surgery following: "Due to recent Plavix use, will start with IV antibiotics and monitor for few days before proceeding with removal due to an extremely high risk of bleeding complications.  Will continue to monitor closely and if more urgent  intervention is needed, we will proceed at that time." Plan port-a-cath removal tomorrow 08/26   New Onset A-fib with RVR - resolved Resolved, repeat EKG showed sinus rhythm CHADS2VASC = 4 Cardiology Dr. 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  Continue ASA   Acute COPD exacerbation Acute hypoxic respiratory failure  Pulmonary Fibrosis  Supplemental O2 Antibiotics as above cover pneumonia  p.o. 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  Monitor BMP  Chronic iron deficiency anemia Acute anemia likely hemodilution from sepsis fluids in ED No signs of active bleeding Start iron supplement Check HH again this afternoon  Hold anticoag  outpatient follow-up with hematology/oncology   CAD Continue ASA hold off Plavix for the portCath removing procedure     DVT prophylaxis: lovenox d/c d/t anemia, SCD initiated  Pertinent IV fluids/nutrition: no iv fluids at this time, NPO p mn for procedure tomorrow  Central lines / invasive devices: port-a-cath as above  Code Status: FULL CODE ACP documentation reviewed: none on file   Current Admission Status: inpatient  TOC needs / Dispo plan: TBD Barriers to discharge / significant pending items: clinical improvement, surgery, anticipate here few more days

## 2023-06-13 NOTE — H&P (View-Only) (Signed)
Subjective:  CC: Austin Patterson is a 67 y.o. male  Hospital stay day 1,   infected port  HPI: No acute issues reported overnight  ROS:  General: Denies weight loss, weight gain, fatigue, fevers, chills, and night sweats. Heart: Denies chest pain, palpitations, racing heart, irregular heartbeat, leg pain or swelling, and decreased activity tolerance. Respiratory: Denies breathing difficulty, shortness of breath, wheezing, cough, and sputum. GI: Denies change in appetite, heartburn, nausea, vomiting, constipation, diarrhea, and blood in stool. GU: Denies difficulty urinating, pain with urinating, urgency, frequency, blood in urine.   Objective:   Temp:  [97.5 F (36.4 C)-98.9 F (37.2 C)] 97.8 F (36.6 C) (08/25 0742) Pulse Rate:  [86-106] 86 (08/25 0444) Resp:  [18-29] 18 (08/25 0742) BP: (120-146)/(58-86) 123/75 (08/25 0742) SpO2:  [85 %-98 %] 97 % (08/25 0744) Weight:  [88.9 kg] 88.9 kg (08/24 1745)     Height: 5' 5.98" (167.6 cm) Weight: 88.9 kg BMI (Calculated): 31.65   Intake/Output this shift:   Intake/Output Summary (Last 24 hours) at 06/13/2023 1146 Last data filed at 06/13/2023 0600 Gross per 24 hour  Intake 341.41 ml  Output 1450 ml  Net -1108.59 ml    Constitutional :  alert, cooperative, appears stated age, and no distress  Respiratory:  clear to auscultation bilaterally  Cardiovascular:  regular rate and rhythm  Gastrointestinal: soft, non-tender; bowel sounds normal; no masses,  no organomegaly.   Skin: Cool and moist.  Infected port site right chest wall.  No worsening erythema.  No drainage or worsening induration.  Minimal tenderness to palpation  Psychiatric: Normal affect, non-agitated, not confused       LABS:     Latest Ref Rng & Units 06/13/2023    4:52 AM 06/12/2023   10:20 AM 06/08/2023   11:04 AM  CMP  Glucose 70 - 99 mg/dL 518  841  660   BUN 8 - 23 mg/dL 13  10  16    Creatinine 0.61 - 1.24 mg/dL 6.30  1.60  1.09   Sodium 135 - 145 mmol/L  132  133  130   Potassium 3.5 - 5.1 mmol/L 3.5  3.3  3.3   Chloride 98 - 111 mmol/L 101  95  95   CO2 22 - 32 mmol/L 23  25  26    Calcium 8.9 - 10.3 mg/dL 6.9  7.6  8.8   Total Protein 6.5 - 8.1 g/dL  7.0  6.9   Total Bilirubin 0.3 - 1.2 mg/dL  0.7  0.4   Alkaline Phos 38 - 126 U/L  106  90   AST 15 - 41 U/L  20  29   ALT 0 - 44 U/L  18  24       Latest Ref Rng & Units 06/13/2023    4:52 AM 06/12/2023   10:20 AM 06/08/2023   11:04 AM  CBC  WBC 4.0 - 10.5 K/uL 22.5  22.7  11.7   Hemoglobin 13.0 - 17.0 g/dL 7.4  9.0  8.6   Hematocrit 39.0 - 52.0 % 22.5  28.7  27.4   Platelets 150 - 400 K/uL 381  368  286     RADS: N/a Assessment:   Infected port.  Continuing IV antibiotics until Plavix wears off.  Overall stable and no worsening so we will tentatively plan port removal for tomorrow.  Will need to monitor sodium levels as well.  Alternatives include continued observation.  Benefits include possible symptom relief, pathologic evaluation. Discussed the  risk of surgery including recurrence, chronic pain, post-op infxn, poor cosmesis, poor/delayed wound healing, and possible re-operation to address said risks. The risks of general anesthetic, if used, includes MI, CVA, sudden death or even reaction to anesthetic medications also discussed.  Typical post-op recovery time of 3-5 days with possible activity restrictions were also discussed.  The patient verbalized understanding and all questions were answered to the patient's satisfaction.  labs/images/medications/previous chart entries reviewed personally and relevant changes/updates noted above.

## 2023-06-13 NOTE — Progress Notes (Signed)
PROGRESS NOTE    Austin Patterson   UVO:[id number] DOB: 1956-05-07  DOA: 06/12/2023 Date of Service: 06/13/23 PCP: Oneita Hurt, No     Brief Narrative / Hospital Course:   Austin Patterson is a 67 y.o. male with medical history significant of stage IV lung cancer on chemotherapy, asthma/COPD, pulmonary fibrosis, chronic HFpEF, CAD with stenting in 2010, HTN. Dx stage IV lung cancer this summer, started chemotherapy 3 weeks ago. 2 days ago he started to have a dry cough and wheezing, when he increased his breathing medications. Last night he started to have fever and chills and decided to come to the hospital 08/24.  08/24: to ED from home. Fever 100.3, tachycardia was found in A-fib with RVR heart rate 170-190 and responded to IV bolus of Cardizem, nonhypotensive nonhypoxic.  X-ray shows no acute infiltrates but no mass on the right lung base.  Blood work showed WBC 22.7, hemoglobin 11.0, creatinine 0.6. Started on vanc, cefepime, azithromycin. Surgical consult - hold plavix, abx, consider removal in few days / sooner if needed 08/25: not meeting sepsis criteria this morning, though still significant leukocytosis 22.5. Hgb lower today, check another CBC in evening. Plan port-a-cath removal tomorrow   Consultants:  General surgery   Procedures: None       ASSESSMENT & PLAN:   Principal Problem:   Sepsis (HCC) Active Problems:   Acute on chronic respiratory failure with hypoxia (HCC)   Asthma-COPD overlap syndrome   Stage IV adenocarcinoma of lung (HCC)   Sepsis d/t infection of port-a-cath, POA fever, leukocytosis, source infection likely right-sided chest abscess and underneath soft tissue infection associated with port cath.   Atypical pneumonia cannot be ruled out at this point either.   Extended abx coverage with vancomycin, ceftriaxone and azithromycin Blood culture pending send MRSA screening General surgery following: "Due to recent Plavix use, will start with IV  antibiotics and monitor for few days before proceeding with removal due to an extremely high risk of bleeding complications.  Will continue to monitor closely and if more urgent intervention is needed, we will proceed at that time." Plan port-a-cath removal tomorrow 08/26   New Onset A-fib with RVR - resolved Resolved, repeat EKG showed sinus rhythm CHADS2VASC = 4 Cardiology Dr. 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  Continue ASA   Acute COPD exacerbation Acute hypoxic respiratory failure  Pulmonary Fibrosis  Supplemental O2 Antibiotics as above cover pneumonia  p.o. 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  Monitor BMP  Chronic iron deficiency anemia Acute anemia likely hemodilution from sepsis fluids in ED No signs of active bleeding Start iron supplement Check HH again this afternoon  Hold anticoag  outpatient follow-up with hematology/oncology   CAD Continue ASA hold off Plavix for the portCath removing procedure     DVT prophylaxis: lovenox d/c d/t anemia, SCD initiated  Pertinent IV fluids/nutrition: no iv fluids at this time, NPO p mn for procedure tomorrow  Central lines / invasive devices: port-a-cath as above  Code Status: FULL CODE ACP documentation reviewed: none on file   Current Admission Status: inpatient  TOC needs / Dispo plan: TBD Barriers to discharge / significant pending items: clinical improvement, surgery, anticipate here few more  days              Subjective / Brief ROS:  Patient reports doing ok today Denies CP/SOB.  Pain controlled though chest is tender  Denies new weakness.  Tolerating diet.   Reports no concerns w/ urination/defecation.   Family Communication: none at this time     Objective Findings:  Vitals:   06/13/23 0722 06/13/23 0742 06/13/23 0744 06/13/23 1312  BP:  123/75  114/71  Pulse:    86  Resp:  18  18  Temp:  97.8 F (36.6 C)  97.6 F (36.4 C)  TempSrc:  Oral  Oral  SpO2: 96% 97% 97% 95%  Weight:      Height:        Intake/Output Summary (Last 24 hours) at 06/13/2023 1352 Last data filed at 06/13/2023 0600 Gross per 24 hour  Intake 341.41 ml  Output 1450 ml  Net -1108.59 ml   Filed Weights   06/12/23 1745  Weight: 88.9 kg    Examination:  Physical Exam Constitutional:      General: He is not in acute distress. Cardiovascular:     Rate and Rhythm: Normal rate and regular rhythm.  Pulmonary:     Effort: Pulmonary effort is normal.     Breath sounds: Normal breath sounds.  Chest:     Comments: R chest all at site of port-a-cath is erythematous and tender, c/w photograph on file from yesterday, appears same as yesterday  Abdominal:     Palpations: Abdomen is soft.  Musculoskeletal:     Right lower leg: No edema.     Left lower leg: No edema.  Skin:    General: Skin is warm and dry.     Comments: See "chest"  Neurological:     General: No focal deficit present.     Mental Status: He is alert.  Psychiatric:        Mood and Affect: Mood normal.        Behavior: Behavior normal.          Scheduled Medications:   atorvastatin  40 mg Oral QHS   azithromycin  500 mg Oral Daily   enoxaparin (LOVENOX) injection  50 mg Subcutaneous Q24H   ferrous sulfate  325 mg Oral BID WC   gabapentin  300 mg Oral TID   ipratropium-albuterol  3 mL Nebulization Q6H   metoprolol tartrate  25 mg Oral BID   mometasone-formoterol  2 puff Inhalation BID   montelukast  10 mg Oral Daily   pantoprazole  40 mg Oral BID   senna-docusate  1 tablet Oral BID   tamsulosin  0.4 mg Oral QPC supper    Continuous Infusions:  cefTRIAXone (ROCEPHIN)  IV  Stopped (06/12/23 1710)   vancomycin 750 mg (06/13/23 1309)    PRN Medications:  acetaminophen, albuterol, chlorpheniramine-HYDROcodone, guaiFENesin-dextromethorphan, metoprolol tartrate, ondansetron, oxyCODONE, prochlorperazine  Antimicrobials from admission:  Anti-infectives (From admission, onward)    Start     Dose/Rate Route Frequency Ordered Stop   06/13/23 1000  azithromycin (ZITHROMAX) tablet 500 mg        500 mg Oral Daily 06/12/23 1247     06/12/23 2300  vancomycin (VANCOREADY) IVPB 750 mg/150 mL        750 mg 150 mL/hr over 60 Minutes Intravenous Every 12 hours 06/12/23 1241     06/12/23 1400  cefTRIAXone (ROCEPHIN) 2 g in sodium chloride 0.9 % 100 mL IVPB        2 g  200 mL/hr over 30 Minutes Intravenous Every 24 hours 06/12/23 1215     06/12/23 1015  vancomycin (VANCOREADY) IVPB 2000 mg/400 mL        2,000 mg 200 mL/hr over 120 Minutes Intravenous  Once 06/12/23 1005 06/12/23 1447   06/12/23 1015  ceFEPIme (MAXIPIME) 2 g in sodium chloride 0.9 % 100 mL IVPB        2 g 200 mL/hr over 30 Minutes Intravenous  Once 06/12/23 1005 06/12/23 1108   06/12/23 1015  azithromycin (ZITHROMAX) 500 mg in sodium chloride 0.9 % 250 mL IVPB        500 mg 250 mL/hr over 60 Minutes Intravenous  Once 06/12/23 1005 06/12/23 1207   06/12/23 1000  vancomycin (VANCOCIN) IVPB 1000 mg/200 mL premix  Status:  Discontinued        1,000 mg 200 mL/hr over 60 Minutes Intravenous  Once 06/12/23 0958 06/12/23 1005   06/12/23 1000  levofloxacin (LEVAQUIN) IVPB 750 mg  Status:  Discontinued        750 mg 100 mL/hr over 90 Minutes Intravenous  Once 06/12/23 6578 06/12/23 1005           Data Reviewed:  I have personally reviewed the following...  CBC: Recent Labs  Lab 06/08/23 1104 06/12/23 1020 06/13/23 0452  WBC 11.7* 22.7* 22.5*  NEUTROABS 8.8* 18.9*  --   HGB 8.6* 9.0* 7.4*  HCT 27.4* 28.7* 22.5*  MCV 87.5 87.8 84.3  PLT 286 368 381   Basic Metabolic Panel: Recent Labs  Lab  06/08/23 1104 06/12/23 1020 06/13/23 0452  NA 130* 133* 132*  K 3.3* 3.3* 3.5  CL 95* 95* 101  CO2 26 25 23   GLUCOSE 172* 144* 126*  BUN 16 10 13   CREATININE 0.97 0.69 0.65  CALCIUM 8.8* 7.6* 6.9*   GFR: Estimated Creatinine Clearance: 93.5 mL/min (by C-G formula based on SCr of 0.65 mg/dL). Liver Function Tests: Recent Labs  Lab 06/08/23 1104 06/12/23 1020  AST 29 20  ALT 24 18  ALKPHOS 90 106  BILITOT 0.4 0.7  PROT 6.9 7.0  ALBUMIN 3.7 3.6   No results for input(s): "LIPASE", "AMYLASE" in the last 168 hours. No results for input(s): "AMMONIA" in the last 168 hours. Coagulation Profile: Recent Labs  Lab 06/12/23 1020  INR 1.2   Cardiac Enzymes: No results for input(s): "CKTOTAL", "CKMB", "CKMBINDEX", "TROPONINI" in the last 168 hours. BNP (last 3 results) No results for input(s): "PROBNP" in the last 8760 hours. HbA1C: No results for input(s): "HGBA1C" in the last 72 hours. CBG: No results for input(s): "GLUCAP" in the last 168 hours. Lipid Profile: No results for input(s): "CHOL", "HDL", "LDLCALC", "TRIG", "CHOLHDL", "LDLDIRECT" in the last 72 hours. Thyroid Function Tests: No results for input(s): "TSH", "T4TOTAL", "FREET4", "T3FREE", "THYROIDAB" in the last 72 hours. Anemia Panel: No results for input(s): "VITAMINB12", "FOLATE", "FERRITIN", "TIBC", "IRON", "RETICCTPCT" in the last 72 hours. Most Recent Urinalysis On File:     Component Value Date/Time   COLORURINE YELLOW (A) 06/12/2023 1942   APPEARANCEUR CLEAR (A) 06/12/2023 1942   LABSPEC 1.021 06/12/2023 1942   PHURINE 5.0 06/12/2023 1942   GLUCOSEU >=500 (A) 06/12/2023 1942   HGBUR NEGATIVE 06/12/2023 1942   BILIRUBINUR NEGATIVE 06/12/2023 1942   KETONESUR NEGATIVE 06/12/2023 1942   PROTEINUR NEGATIVE 06/12/2023 1942   NITRITE NEGATIVE 06/12/2023 1942   LEUKOCYTESUR NEGATIVE 06/12/2023 1942   Sepsis Labs: @LABRCNTIP (procalcitonin:4,lacticidven:4) Microbiology: Recent Results (from the past  240 hour(s))  Resp panel  by RT-PCR (RSV, Flu A&B, Covid) Anterior Nasal Swab     Status: None   Collection Time: 06/12/23 10:20 AM   Specimen: Anterior Nasal Swab  Result Value Ref Range Status   SARS Coronavirus 2 by RT PCR NEGATIVE NEGATIVE Final    Comment: (NOTE) SARS-CoV-2 target nucleic acids are NOT DETECTED.  The SARS-CoV-2 RNA is generally detectable in upper respiratory specimens during the acute phase of infection. The lowest concentration of SARS-CoV-2 viral copies this assay can detect is 138 copies/mL. A negative result does not preclude SARS-Cov-2 infection and should not be used as the sole basis for treatment or other patient management decisions. A negative result may occur with  improper specimen collection/handling, submission of specimen other than nasopharyngeal swab, presence of viral mutation(s) within the areas targeted by this assay, and inadequate number of viral copies(<138 copies/mL). A negative result must be combined with clinical observations, patient history, and epidemiological information. The expected result is Negative.  Fact Sheet for Patients:  BloggerCourse.com  Fact Sheet for Healthcare Providers:  SeriousBroker.it  This test is no t yet approved or cleared by the [place] FDA and  has been authorized for detection and/or diagnosis of SARS-CoV-2 by FDA under an Emergency Use Authorization (EUA). This EUA will remain  in effect (meaning this test can be used) for the duration of the COVID-19 declaration under Section 564(b)(1) of the Act, 21 U.S.C.section 360bbb-3(b)(1), unless the authorization is terminated  or revoked sooner.       Influenza A by PCR NEGATIVE NEGATIVE Final   Influenza B by PCR NEGATIVE NEGATIVE Final    Comment: (NOTE) The Xpert Xpress SARS-CoV-2/FLU/RSV plus assay is intended as an aid in the diagnosis of influenza from Nasopharyngeal swab specimens and should  not be used as a sole basis for treatment. Nasal washings and aspirates are unacceptable for Xpert Xpress SARS-CoV-2/FLU/RSV testing.  Fact Sheet for Patients: BloggerCourse.com  Fact Sheet for Healthcare Providers: SeriousBroker.it  This test is not yet approved or cleared by the [place] FDA and has been authorized for detection and/or diagnosis of SARS-CoV-2 by FDA under an Emergency Use Authorization (EUA). This EUA will remain in effect (meaning this test can be used) for the duration of the COVID-19 declaration under Section 564(b)(1) of the Act, 21 U.S.C. section 360bbb-3(b)(1), unless the authorization is terminated or revoked.     Resp Syncytial Virus by PCR NEGATIVE NEGATIVE Final    Comment: (NOTE) Fact Sheet for Patients: BloggerCourse.com  Fact Sheet for Healthcare Providers: SeriousBroker.it  This test is not yet approved or cleared by the [place] FDA and has been authorized for detection and/or diagnosis of SARS-CoV-2 by FDA under an Emergency Use Authorization (EUA). This EUA will remain in effect (meaning this test can be used) for the duration of the COVID-19 declaration under Section 564(b)(1) of the Act, 21 U.S.C. section 360bbb-3(b)(1), unless the authorization is terminated or revoked.  Performed at [facility], [address] [place], [place] [address]   Blood Culture (routine x 2)     Status: None (Preliminary result)   Collection Time: 06/12/23 10:20 AM   Specimen: BLOOD  Result Value Ref Range Status   Specimen Description BLOOD BLOOD LEFT ARM  Final   Special Requests   Final    BOTTLES DRAWN AEROBIC AND ANAEROBIC Blood Culture adequate volume   Culture   Final    NO GROWTH < 24 HOURS Performed at [facility], [address] [place], [place]  [address]    Report Status PENDING  Incomplete  Blood Culture  (routine x 2)     Status: None (Preliminary result)   Collection Time: 06/12/23  4:03 PM   Specimen: BLOOD  Result Value Ref Range Status   Specimen Description BLOOD BLOOD RIGHT ARM  Final   Special Requests   Final    BOTTLES DRAWN AEROBIC AND ANAEROBIC Blood Culture results may not be optimal due to an excessive volume of blood received in culture bottles   Culture   Final    NO GROWTH < 24 HOURS Performed at [facility], [address], [place], [place] [address]    Report Status PENDING  Incomplete      Radiology Studies last 3 days: [facility] Chest Port 1 View  Result Date: 06/12/2023 CLINICAL DATA:  Questionable sepsis. EXAM: PORTABLE CHEST 1 VIEW COMPARISON:  05/11/2023 FINDINGS: Rounded opacity at the right base, a known mass. Interstitial reticulation from emphysema. No acute opacity, effusion, or pneumothorax. Normal heart size and mediastinal contours. Porta catheter with tip at the upper SVC. IMPRESSION: 1. No acute finding when compared to prior. 2. Emphysema and mass at the right lung base. Electronically Signed   By: Tiburcio Pea M.D.   On: 06/12/2023 10:46             LOS: 1 day      Sunnie Nielsen, DO [facility] 06/13/2023, 1:52 PM    Dictation software may have been used to generate the above note. Typos may occur and escape review in typed/dictated notes. Please contact Dr Lyn Hollingshead directly for clarity if needed.  Staff may message me via secure chat in Epic  but this may not receive an immediate response,  please page me for urgent matters!  If 7PM-7AM, please contact night coverage [email or web address]

## 2023-06-13 NOTE — Progress Notes (Signed)
Subjective:  CC: Austin Patterson is a 67 y.o. male  Hospital stay day 1,   infected port  HPI: No acute issues reported overnight  ROS:  General: Denies weight loss, weight gain, fatigue, fevers, chills, and night sweats. Heart: Denies chest pain, palpitations, racing heart, irregular heartbeat, leg pain or swelling, and decreased activity tolerance. Respiratory: Denies breathing difficulty, shortness of breath, wheezing, cough, and sputum. GI: Denies change in appetite, heartburn, nausea, vomiting, constipation, diarrhea, and blood in stool. GU: Denies difficulty urinating, pain with urinating, urgency, frequency, blood in urine.   Objective:   Temp:  [97.5 F (36.4 C)-98.9 F (37.2 C)] 97.8 F (36.6 C) (08/25 0742) Pulse Rate:  [86-106] 86 (08/25 0444) Resp:  [18-29] 18 (08/25 0742) BP: (120-146)/(58-86) 123/75 (08/25 0742) SpO2:  [85 %-98 %] 97 % (08/25 0744) Weight:  [88.9 kg] 88.9 kg (08/24 1745)     Height: 5' 5.98" (167.6 cm) Weight: 88.9 kg BMI (Calculated): 31.65   Intake/Output this shift:   Intake/Output Summary (Last 24 hours) at 06/13/2023 1146 Last data filed at 06/13/2023 0600 Gross per 24 hour  Intake 341.41 ml  Output 1450 ml  Net -1108.59 ml    Constitutional :  alert, cooperative, appears stated age, and no distress  Respiratory:  clear to auscultation bilaterally  Cardiovascular:  regular rate and rhythm  Gastrointestinal: soft, non-tender; bowel sounds normal; no masses,  no organomegaly.   Skin: Cool and moist.  Infected port site right chest wall.  No worsening erythema.  No drainage or worsening induration.  Minimal tenderness to palpation  Psychiatric: Normal affect, non-agitated, not confused       LABS:     Latest Ref Rng & Units 06/13/2023    4:52 AM 06/12/2023   10:20 AM 06/08/2023   11:04 AM  CMP  Glucose 70 - 99 mg/dL 518  841  660   BUN 8 - 23 mg/dL 13  10  16    Creatinine 0.61 - 1.24 mg/dL 6.30  1.60  1.09   Sodium 135 - 145 mmol/L  132  133  130   Potassium 3.5 - 5.1 mmol/L 3.5  3.3  3.3   Chloride 98 - 111 mmol/L 101  95  95   CO2 22 - 32 mmol/L 23  25  26    Calcium 8.9 - 10.3 mg/dL 6.9  7.6  8.8   Total Protein 6.5 - 8.1 g/dL  7.0  6.9   Total Bilirubin 0.3 - 1.2 mg/dL  0.7  0.4   Alkaline Phos 38 - 126 U/L  106  90   AST 15 - 41 U/L  20  29   ALT 0 - 44 U/L  18  24       Latest Ref Rng & Units 06/13/2023    4:52 AM 06/12/2023   10:20 AM 06/08/2023   11:04 AM  CBC  WBC 4.0 - 10.5 K/uL 22.5  22.7  11.7   Hemoglobin 13.0 - 17.0 g/dL 7.4  9.0  8.6   Hematocrit 39.0 - 52.0 % 22.5  28.7  27.4   Platelets 150 - 400 K/uL 381  368  286     RADS: N/a Assessment:   Infected port.  Continuing IV antibiotics until Plavix wears off.  Overall stable and no worsening so we will tentatively plan port removal for tomorrow.  Will need to monitor sodium levels as well.  Alternatives include continued observation.  Benefits include possible symptom relief, pathologic evaluation. Discussed the  risk of surgery including recurrence, chronic pain, post-op infxn, poor cosmesis, poor/delayed wound healing, and possible re-operation to address said risks. The risks of general anesthetic, if used, includes MI, CVA, sudden death or even reaction to anesthetic medications also discussed.  Typical post-op recovery time of 3-5 days with possible activity restrictions were also discussed.  The patient verbalized understanding and all questions were answered to the patient's satisfaction.  labs/images/medications/previous chart entries reviewed personally and relevant changes/updates noted above.

## 2023-06-13 NOTE — Progress Notes (Signed)
Responded to consult for AD paperwork to be complete. Although efforts where unsuccessful in completing it today, I met with pt and went over paperwork and ensured him we would have staff here in the morning to complete it. Pt only concern is he is having surgery tomorrow and does not know what time. I spoke with his nurse and explained to her to notify us if things change. But that a chaplain would be by first thing tomorrow to finish this process. Pt expressed wanting his son to be his HCPOA and just needed to notarize the documents to make it official. Offered my support to pt and explained if he needed anything else or wanted to just talk we were available.

## 2023-06-14 ENCOUNTER — Other Ambulatory Visit: Payer: Medicare HMO

## 2023-06-14 ENCOUNTER — Inpatient Hospital Stay: Payer: Medicare HMO | Admitting: Certified Registered Nurse Anesthetist

## 2023-06-14 ENCOUNTER — Encounter
Admission: EM | Disposition: A | Discharge status: Home Health | Payer: Self-pay | Source: Home / Self Care | Attending: Student

## 2023-06-14 ENCOUNTER — Telehealth: Payer: Self-pay | Admitting: *Deleted

## 2023-06-14 ENCOUNTER — Ambulatory Visit: Payer: Medicare HMO

## 2023-06-14 ENCOUNTER — Inpatient Hospital Stay: Payer: Medicare HMO

## 2023-06-14 ENCOUNTER — Ambulatory Visit: Payer: Medicare HMO | Admitting: Internal Medicine

## 2023-06-14 ENCOUNTER — Inpatient Hospital Stay: Payer: Medicare HMO | Admitting: Internal Medicine

## 2023-06-14 DIAGNOSIS — A419 Sepsis, unspecified organism: Secondary | ICD-10-CM | POA: Diagnosis not present

## 2023-06-14 HISTORY — PX: PORT-A-CATH REMOVAL: SHX5289

## 2023-06-14 LAB — CBC
HCT: 26.4 % — ABNORMAL LOW (ref 39.0–52.0)
HCT: 26.7 % — ABNORMAL LOW (ref 39.0–52.0)
Hemoglobin: 8 g/dL — ABNORMAL LOW (ref 13.0–17.0)
Hemoglobin: 8.2 g/dL — ABNORMAL LOW (ref 13.0–17.0)
MCH: 26.7 pg (ref 26.0–34.0)
MCH: 26.8 pg (ref 26.0–34.0)
MCHC: 30.3 g/dL (ref 30.0–36.0)
MCHC: 30.7 g/dL (ref 30.0–36.0)
MCV: 88 fL (ref 80.0–100.0)
MCV: 87.3 fL (ref 80.0–100.0)
Platelets: 441 10*3/uL — ABNORMAL HIGH (ref 150–400)
Platelets: 438 10*3/uL — ABNORMAL HIGH (ref 150–400)
RBC: 3 MIL/uL — ABNORMAL LOW (ref 4.22–5.81)
RBC: 3.06 MIL/uL — ABNORMAL LOW (ref 4.22–5.81)
RDW: 18.5 % — ABNORMAL HIGH (ref 11.5–15.5)
RDW: 18.6 % — ABNORMAL HIGH (ref 11.5–15.5)
WBC: 16 10*3/uL — ABNORMAL HIGH (ref 4.0–10.5)
WBC: 14.6 10*3/uL — ABNORMAL HIGH (ref 4.0–10.5)
nRBC: 0.1 % (ref 0.0–0.2)
nRBC: 0.2 % (ref 0.0–0.2)

## 2023-06-14 LAB — BASIC METABOLIC PANEL
Anion gap: 13 (ref 5–15)
BUN: 11 mg/dL (ref 8–23)
CO2: 27 mmol/L (ref 22–32)
Calcium: 7.5 mg/dL — ABNORMAL LOW (ref 8.9–10.3)
Chloride: 101 mmol/L (ref 98–111)
Creatinine, Ser: 0.7 mg/dL (ref 0.61–1.24)
GFR, Estimated: >60 mL/min (ref >60)
Glucose, Bld: 129 mg/dL — ABNORMAL HIGH (ref 70–99)
Potassium: 4.2 mmol/L (ref 3.5–5.1)
Sodium: 136 mmol/L (ref 135–145)

## 2023-06-14 LAB — CREATININE, SERUM
Creatinine, Ser: 0.61 mg/dL (ref 0.61–1.24)
GFR, Estimated: >60 mL/min (ref >60)

## 2023-06-14 SURGERY — REMOVAL PORT-A-CATH
Anesthesia: General | Site: Chest

## 2023-06-14 MED ORDER — PROPOFOL 1000 MG/100ML IV EMUL
INTRAVENOUS | Status: AC
  Filled 2023-06-14: qty 100

## 2023-06-14 MED ORDER — KETAMINE HCL 10 MG/ML IJ SOLN
INTRAMUSCULAR | Status: DC | PRN
  Administered 2023-06-14 (×2): 10 mg via INTRAVENOUS
  Administered 2023-06-14: 20 mg via INTRAVENOUS
  Administered 2023-06-14: 10 mg via INTRAVENOUS

## 2023-06-14 MED ORDER — FENTANYL CITRATE (PF) 100 MCG/2ML IJ SOLN
INTRAMUSCULAR | Status: DC | PRN
  Administered 2023-06-14: 25 ug via INTRAVENOUS

## 2023-06-14 MED ORDER — DEXMEDETOMIDINE HCL IN NACL 80 MCG/20ML IV SOLN
INTRAVENOUS | Status: DC | PRN
  Administered 2023-06-14: 4 ug via INTRAVENOUS
  Administered 2023-06-14 (×2): 8 ug via INTRAVENOUS
  Administered 2023-06-14 (×5): 4 ug via INTRAVENOUS

## 2023-06-14 MED ORDER — FENTANYL CITRATE (PF) 100 MCG/2ML IJ SOLN
INTRAMUSCULAR | Status: AC
  Filled 2023-06-14: qty 2

## 2023-06-14 MED ORDER — MIDAZOLAM HCL 2 MG/2ML IJ SOLN
INTRAMUSCULAR | Status: AC
  Filled 2023-06-14: qty 2

## 2023-06-14 MED ORDER — LIDOCAINE HCL (PF) 1 % IJ SOLN
INTRAMUSCULAR | Status: AC
  Filled 2023-06-14: qty 30

## 2023-06-14 MED ORDER — PROPOFOL 500 MG/50ML IV EMUL
INTRAVENOUS | Status: DC | PRN
  Administered 2023-06-14: 25 ug/kg/min via INTRAVENOUS

## 2023-06-14 MED ORDER — BUPIVACAINE HCL (PF) 0.5 % IJ SOLN
INTRAMUSCULAR | Status: AC
  Filled 2023-06-14: qty 30

## 2023-06-14 MED ORDER — 0.9 % SODIUM CHLORIDE (POUR BTL) OPTIME
TOPICAL | Status: DC | PRN
  Administered 2023-06-14: 300 mL

## 2023-06-14 MED ORDER — KETAMINE HCL 50 MG/5ML IJ SOSY
PREFILLED_SYRINGE | INTRAMUSCULAR | Status: AC
  Filled 2023-06-14: qty 5

## 2023-06-14 MED ORDER — LIDOCAINE-EPINEPHRINE 1 %-1:100000 IJ SOLN
INTRAMUSCULAR | Status: AC
  Filled 2023-06-14: qty 1

## 2023-06-14 MED ORDER — BUPIVACAINE-EPINEPHRINE (PF) 0.25% -1:200000 IJ SOLN
INTRAMUSCULAR | Status: AC
  Filled 2023-06-14: qty 30

## 2023-06-14 MED ORDER — FENTANYL CITRATE (PF) 100 MCG/2ML IJ SOLN
25.0000 ug | INTRAMUSCULAR | Status: DC | PRN
  Administered 2023-06-14: 25 ug via INTRAVENOUS
  Administered 2023-06-14: 50 ug via INTRAVENOUS
  Administered 2023-06-14: 25 ug via INTRAVENOUS

## 2023-06-14 MED ORDER — IPRATROPIUM-ALBUTEROL 0.5-2.5 (3) MG/3ML IN SOLN
RESPIRATORY_TRACT | Status: AC
  Filled 2023-06-14: qty 3

## 2023-06-14 MED ORDER — LACTATED RINGERS IV SOLN: INTRAVENOUS | Status: DC | PRN

## 2023-06-14 MED ORDER — LIDOCAINE HCL (PF) 2 % IJ SOLN
INTRAMUSCULAR | Status: AC
  Filled 2023-06-14: qty 5

## 2023-06-14 MED ORDER — MIDAZOLAM HCL 2 MG/2ML IJ SOLN
INTRAMUSCULAR | Status: DC | PRN
  Administered 2023-06-14: .5 mg via INTRAVENOUS
  Administered 2023-06-14: 1 mg via INTRAVENOUS
  Administered 2023-06-14: .5 mg via INTRAVENOUS

## 2023-06-14 MED ORDER — LIDOCAINE HCL (PF) 1 % IJ SOLN
INTRAMUSCULAR | Status: DC | PRN
  Administered 2023-06-14: 16 mL

## 2023-06-14 MED ORDER — IPRATROPIUM-ALBUTEROL 0.5-2.5 (3) MG/3ML IN SOLN
3.0000 mL | RESPIRATORY_TRACT | Status: DC
  Administered 2023-06-14: 3 mL via RESPIRATORY_TRACT

## 2023-06-14 SURGICAL SUPPLY — 46 items
ADH SKN CLS APL DERMABOND .7 (GAUZE/BANDAGES/DRESSINGS) ×1
APL PRP STRL LF DISP 70% ISPRP (MISCELLANEOUS) ×1
BAG BIOHAZARD 6X9 CLR ZIPLOCK (MISCELLANEOUS) ×1 IMPLANT
BAG SPEC THK2 9X6 BHZR CLR (MISCELLANEOUS)
BASIN GRAD PLASTIC 32OZ STRL (MISCELLANEOUS) IMPLANT
BLADE SURG 15 STRL LF DISP TIS (BLADE) ×1 IMPLANT
BLADE SURG 15 STRL SS (BLADE) ×1
BLADE SURG SZ11 CARB STEEL (BLADE) ×1 IMPLANT
CHLORAPREP W/TINT 26 (MISCELLANEOUS) ×1 IMPLANT
CNTNR URN SCR LID CUP LEK RST (MISCELLANEOUS) IMPLANT
CONT SPEC 4OZ STRL OR WHT (MISCELLANEOUS) ×1
COVER LIGHT HANDLE STERIS (MISCELLANEOUS) IMPLANT
COVER MAYO STAND STRL (DRAPES) IMPLANT
CUP MEDICINE 2OZ PLAST GRAD ST (MISCELLANEOUS) IMPLANT
DERMABOND ADVANCED .7 DNX12 (GAUZE/BANDAGES/DRESSINGS) ×1 IMPLANT
DRAPE LAPAROTOMY 100X77 ABD (DRAPES) IMPLANT
DRAPE LAPAROTOMY 77X122 PED (DRAPES) ×1 IMPLANT
DRSG TEGADERM 2-3/8X2-3/4 SM (GAUZE/BANDAGES/DRESSINGS) IMPLANT
ELECT REM PT RETURN 9FT ADLT (ELECTROSURGICAL) ×1
ELECTRODE REM PT RTRN 9FT ADLT (ELECTROSURGICAL) ×1 IMPLANT
GAUZE 4X4 16PLY ~~LOC~~+RFID DBL (SPONGE) ×1 IMPLANT
GAUZE SPONGE 2X2 STRL 8-PLY (GAUZE/BANDAGES/DRESSINGS) IMPLANT
GLOVE BIOGEL PI IND STRL 7.0 (GLOVE) ×1 IMPLANT
GLOVE SURG SYN 6.5 ES PF (GLOVE) ×1 IMPLANT
GLOVE SURG SYN 6.5 PF PI (GLOVE) ×1 IMPLANT
GOWN STRL REUS W/ TWL LRG LVL3 (GOWN DISPOSABLE) ×2 IMPLANT
GOWN STRL REUS W/TWL LRG LVL3 (GOWN DISPOSABLE) ×2
HANDLE YANKAUER SUCT BULB TIP (MISCELLANEOUS) IMPLANT
LABEL OR SOLS (LABEL) IMPLANT
MANIFOLD NEPTUNE II (INSTRUMENTS) ×1 IMPLANT
MARKER SKIN DUAL TIP RULER LAB (MISCELLANEOUS) IMPLANT
NDL HYPO 25X1 1.5 SAFETY (NEEDLE) ×1 IMPLANT
NEEDLE HYPO 25X1 1.5 SAFETY (NEEDLE) ×1 IMPLANT
NS IRRIG 500ML POUR BTL (IV SOLUTION) ×1 IMPLANT
PACK BASIN MINOR ARMC (MISCELLANEOUS) ×1 IMPLANT
PACKING GAUZE IODOFORM 1INX5YD (GAUZE/BANDAGES/DRESSINGS) IMPLANT
SPONGE T-LAP 18X18 ~~LOC~~+RFID (SPONGE) ×1 IMPLANT
SUT MNCRL AB 4-0 PS2 18 (SUTURE) ×1 IMPLANT
SUT VIC AB 3-0 SH 27 (SUTURE)
SUT VIC AB 3-0 SH 27X BRD (SUTURE) ×1 IMPLANT
SYR 20ML LL LF (SYRINGE) ×1 IMPLANT
SYR BULB IRRIG 60ML STRL (SYRINGE) IMPLANT
TOWEL OR 17X26 4PK STRL BLUE (TOWEL DISPOSABLE) IMPLANT
TRAP FLUID SMOKE EVACUATOR (MISCELLANEOUS) ×1 IMPLANT
TUBING CONNECTING 10 (TUBING) IMPLANT
WATER STERILE IRR 500ML POUR (IV SOLUTION) ×1 IMPLANT

## 2023-06-14 NOTE — Op Note (Signed)
Pre-Op Dx: infected port Post-Op Dx: same Anesthesia:local  EBL: minimal Complications:  none apparent  Procedure:  removal of port cath Surgeon: Tonna Boehringer  Indication for procedure: see H&P  Description of Procedure:  Consent obtained, time out performed.  Patient placed in supine position.  Area sterilized and draped in usual position.  Half percent Marcaine infused to previous incision site and after confirming adequate anesthesia incision was made and dissection carried down to the fascial layer where the port was noted.  The capsule around the port was dissected bluntly.  Further blunt dissection was carried out around the catheter itself until all attachments were released.  After noting free movement of the catheter, the port and the attached catheter was removed completely with no resistance and pressure was applied directly over the IJ insertion site for over 5 minutes.  Tip of the catheter was noted to be intact as well. Port sent for culture.  Hemostasis confirmed at the former port site which was irrigated prior to packing with one inch iodoform gauze, secured with 2x2 and tegaderm.    At the end of the procedure there was no swelling in the neck area to indicate a expanding hematoma and the patient was comfortable.  At the end of the count all sponge and instrument counts were correct as well.  Pt transferred to PACU in stable condition

## 2023-06-14 NOTE — Progress Notes (Signed)
PROGRESS NOTE    Austin Patterson   ZOX:[id number] DOB: 07/15/1956  DOA: 06/12/2023 Date of Service: 06/14/23 PCP: Oneita Hurt, No     Brief Narrative / Hospital Course:  Austin Patterson is a 67 y.o. 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 Dx stage IV lung cancer this summer, started chemotherapy 3 weeks ago. 2 days ago he started to have a dry cough and wheezing, when he increased his breathing medications. Last night he started to have fever and chills and decided to come to the hospital 08/24.  08/24: to ED from home. Fever 100.3, tachycardia was found in A-fib with RVR heart rate 170-190 and responded to IV bolus of Cardizem, nonhypotensive nonhypoxic.  X-ray shows no acute infiltrates but no mass on the right lung base.  Blood work showed WBC 22.7, hemoglobin 11.0, creatinine 0.6. Started on vanc, cefepime, azithromycin. Surgical consult - hold plavix, abx, consider removal in few days / sooner if needed 08/25: not meeting sepsis criteria this morning, though still significant leukocytosis 22.5. Plan port-a-cath removal tomorrow  08/26: port-a-cath removal mid-day. BCx NGx2d. WBC down to 16, Hgb better at 8.0. Pot-a-cath removed today. Clinical cellulitis still significant, will keep on IV abx through next 24-48 hours   Consultants:  General surgery   Procedures: Removal port cath 06/14/23 w/ Dr Tonna Boehringer        ASSESSMENT & PLAN:   Principal Problem:   Sepsis (HCC) Active Problems:   Acute on chronic respiratory failure with hypoxia (HCC)   Asthma-COPD overlap syndrome   Stage IV adenocarcinoma of lung (HCC)   Sepsis d/t infection of port-a-cath, POA fever, leukocytosis, source infection likely right-sided chest abscess and underneath soft tissue infection associated with port cath.   Atypical pneumonia cannot be ruled out at this  point either.   Extended abx coverage with vancomycin, ceftriaxone and azithromycin Blood culture --> NGx2d MRSA screening --> pending port-a-cath removal today 08/26 Clinical cellulitis still significant, will keep on IV abx through next 24-48 hours    New Onset A-fib with RVR - resolved Resolved, repeat EKG showed sinus rhythm CHADS2VASC = 4 Cardiology Dr. 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  Continue ASA   Acute COPD exacerbation Acute hypoxic respiratory failure  Pulmonary Fibrosis  Supplemental O2 Antibiotics as above cover pneumonia  p.o. 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  Monitor BMP  Chronic iron deficiency anemia Acute anemia likely hemodilution from sepsis fluids in ED No signs of active bleeding Start iron supplement Check HH again this afternoon  Hold anticoag  outpatient follow-up with hematology/oncology   CAD Continue ASA hold off Plavix for the portCath removing procedure     DVT prophylaxis: lovenox d/c d/t anemia, SCD initiated  Pertinent IV fluids/nutrition: no iv fluids at this time, NPO p mn for procedure tomorrow  Central lines / invasive devices: port-a-cath as above  Code Status: FULL CODE ACP documentation reviewed: none on file   Current Admission Status: inpatient  TOC needs / Dispo plan: TBD Barriers to discharge / significant pending items: clinical improvement,  needing IV abx, anticipate here few more days              Subjective / Brief ROS:  Patient reports doing ok today other than some neck pain fater moving in bed, thinks he pulled  something.  Denies CP/SOB.  Pain controlled though chest is tender, still red Denies new weakness.   Family Communication: none at this time     Objective Findings:  Vitals:   06/14/23 0408 06/14/23 0726 06/14/23 0806 06/14/23 1145  BP: 132/67  (!) 144/74 (!) 160/92  Pulse: 91  94 94  Resp: 18  18   Temp: 97.7 F (36.5 C)  97.7 F (36.5 C) 98.2 F (36.8 C)  TempSrc: Oral   Oral  SpO2: 96% 94% 96% 95%  Weight:      Height:        Intake/Output Summary (Last 24 hours) at 06/14/2023 1333 Last data filed at 06/14/2023 1259 Gross per 24 hour  Intake 990.21 ml  Output 2355 ml  Net -1364.79 ml   Filed Weights   06/12/23 1745  Weight: 88.9 kg    Examination:  Physical Exam Constitutional:      General: He is not in acute distress. Cardiovascular:     Rate and Rhythm: Normal rate and regular rhythm.  Pulmonary:     Effort: Pulmonary effort is normal.     Breath sounds: Normal breath sounds.  Chest:     Comments: R chest all at site of port-a-cath is erythematous and tender, c/w photograph on file from yesterday, appears same as yesterday  Abdominal:     Palpations: Abdomen is soft.  Musculoskeletal:     Right lower leg: No edema.     Left lower leg: No edema.  Skin:    General: Skin is warm and dry.     Comments: See "chest"  Neurological:     General: No focal deficit present.     Mental Status: He is alert.  Psychiatric:        Mood and Affect: Mood normal.        Behavior: Behavior normal.          Scheduled Medications:   [MAR Hold] atorvastatin  40 mg Oral QHS   [MAR Hold] azithromycin  500 mg Oral Daily   [MAR Hold] enoxaparin (LOVENOX) injection  50 mg Subcutaneous Q24H   [MAR Hold] ferrous sulfate  325 mg Oral BID WC   [MAR Hold] gabapentin  300 mg Oral TID   [MAR Hold] ipratropium-albuterol  3 mL Nebulization Q6H   ipratropium-albuterol  3 mL Nebulization Q4H   [MAR Hold] metoprolol tartrate  25 mg Oral BID   [MAR Hold]  mometasone-formoterol  2 puff Inhalation BID   [MAR Hold] montelukast  10 mg Oral Daily   [MAR Hold] pantoprazole  40 mg Oral BID   [MAR Hold] senna-docusate  1 tablet Oral BID   [MAR Hold] tamsulosin  0.4 mg Oral QPC supper    Continuous Infusions:  [MAR Hold] cefTRIAXone (ROCEPHIN)  IV Stopped (06/13/23 1532)   [MAR Hold] vancomycin 0 mg (06/13/23 2311)    PRN Medications:  [MAR Hold] acetaminophen, [MAR Hold] albuterol, [MAR Hold] chlorpheniramine-HYDROcodone, fentaNYL (SUBLIMAZE) injection, [MAR Hold] guaiFENesin-dextromethorphan, [MAR Hold] metoprolol tartrate, [MAR Hold] Muscle Rub, [MAR Hold] ondansetron, [MAR Hold] oxyCODONE, [MAR Hold] prochlorperazine  Antimicrobials from admission:  Anti-infectives (From admission, onward)    Start     Dose/Rate Route Frequency Ordered Stop   06/13/23 1000  [MAR Hold]  azithromycin (ZITHROMAX) tablet 500 mg        (MAR Hold since Mon 06/14/2023 at 1155.Hold Reason: Transfer to a Procedural area)   500 mg Oral Daily 06/12/23 1247     06/12/23 2300  [MAR Hold]  vancomycin (VANCOREADY) IVPB 750 mg/150 mL        (MAR Hold since Mon 06/14/2023 at 1155.Hold Reason: Transfer to a Procedural area)   750 mg 150 mL/hr over 60 Minutes Intravenous Every 12 hours 06/12/23 1241     06/12/23 1400  [MAR Hold]  cefTRIAXone (ROCEPHIN) 2 g in sodium chloride 0.9 % 100 mL IVPB        (MAR Hold since Mon 06/14/2023 at 1155.Hold Reason: Transfer to a Procedural area)   2 g 200 mL/hr over 30 Minutes Intravenous Every 24 hours 06/12/23 1215     06/12/23 1015  vancomycin (VANCOREADY) IVPB 2000 mg/400 mL        2,000 mg 200 mL/hr over 120 Minutes Intravenous  Once 06/12/23 1005 06/12/23 1447   06/12/23 1015  ceFEPIme (MAXIPIME) 2 g in sodium chloride 0.9 % 100 mL IVPB        2 g 200 mL/hr over 30 Minutes Intravenous  Once 06/12/23 1005 06/12/23 1108   06/12/23 1015  azithromycin (ZITHROMAX) 500 mg in sodium chloride 0.9 % 250 mL IVPB        500 mg 250 mL/hr over 60  Minutes Intravenous  Once 06/12/23 1005 06/12/23 1207   06/12/23 1000  vancomycin (VANCOCIN) IVPB 1000 mg/200 mL premix  Status:  Discontinued        1,000 mg 200 mL/hr over 60 Minutes Intravenous  Once 06/12/23 0958 06/12/23 1005   06/12/23 1000  levofloxacin (LEVAQUIN) IVPB 750 mg  Status:  Discontinued        750 mg 100 mL/hr over 90 Minutes Intravenous  Once 06/12/23 0958 06/12/23 1005           Data Reviewed:  I have personally reviewed the following...  CBC: Recent Labs  Lab 06/08/23 1104 06/12/23 1020 06/13/23 0452 06/13/23 1402 06/14/23 0446  WBC 11.7* 22.7* 22.5*  --  16.0*  NEUTROABS 8.8* 18.9*  --   --   --   HGB 8.6* 9.0* 7.4* 7.8* 8.0*  HCT 27.4* 28.7* 22.5* 25.1* 26.4*  MCV 87.5 87.8 84.3  --  88.0  PLT 286 368 381  --  441*   Basic Metabolic Panel: Recent Labs  Lab 06/08/23 1104 06/12/23 1020 06/13/23 0452 06/14/23 0446  NA 130* 133* 132* 136  K 3.3* 3.3* 3.5 4.2  CL 95* 95* 101 101  CO2 26 25 23 27   GLUCOSE 172* 144* 126* 129*  BUN 16 10 13 11   CREATININE 0.97 0.69 0.65 0.70  CALCIUM 8.8* 7.6* 6.9* 7.5*   GFR: Estimated Creatinine Clearance: 93.5 mL/min (by C-G formula based on SCr of 0.7 mg/dL). Liver Function Tests: Recent Labs  Lab 06/08/23 1104 06/12/23 1020  AST 29 20  ALT 24 18  ALKPHOS 90 106  BILITOT 0.4 0.7  PROT 6.9 7.0  ALBUMIN 3.7 3.6   No results for input(s): "LIPASE", "AMYLASE" in the last 168 hours. No results for input(s): "AMMONIA" in the last 168 hours. Coagulation Profile: Recent Labs  Lab 06/12/23 1020  INR 1.2   Cardiac Enzymes: No results for input(s): "CKTOTAL", "CKMB", "CKMBINDEX", "TROPONINI" in the last 168 hours. BNP (last 3 results) No results for input(s): "PROBNP" in the last 8760 hours. HbA1C: No results for input(s): "  HGBA1C" in the last 72 hours. CBG: No results for input(s): "GLUCAP" in the last 168 hours. Lipid Profile: No results for input(s): "CHOL", "HDL", "LDLCALC", "TRIG",  "CHOLHDL", "LDLDIRECT" in the last 72 hours. Thyroid Function Tests: No results for input(s): "TSH", "T4TOTAL", "FREET4", "T3FREE", "THYROIDAB" in the last 72 hours. Anemia Panel: No results for input(s): "VITAMINB12", "FOLATE", "FERRITIN", "TIBC", "IRON", "RETICCTPCT" in the last 72 hours. Most Recent Urinalysis On File:     Component Value Date/Time   COLORURINE YELLOW (A) 06/12/2023 1942   APPEARANCEUR CLEAR (A) 06/12/2023 1942   LABSPEC 1.021 06/12/2023 1942   PHURINE 5.0 06/12/2023 1942   GLUCOSEU >=500 (A) 06/12/2023 1942   HGBUR NEGATIVE 06/12/2023 1942   BILIRUBINUR NEGATIVE 06/12/2023 1942   KETONESUR NEGATIVE 06/12/2023 1942   PROTEINUR NEGATIVE 06/12/2023 1942   NITRITE NEGATIVE 06/12/2023 1942   LEUKOCYTESUR NEGATIVE 06/12/2023 1942   Sepsis Labs: @LABRCNTIP (procalcitonin:4,lacticidven:4) Microbiology: Recent Results (from the past 240 hour(s))  Resp panel by RT-PCR (RSV, Flu A&B, Covid) Anterior Nasal Swab     Status: None   Collection Time: 06/12/23 10:20 AM   Specimen: Anterior Nasal Swab  Result Value Ref Range Status   SARS Coronavirus 2 by RT PCR NEGATIVE NEGATIVE Final    Comment: (NOTE) SARS-CoV-2 target nucleic acids are NOT DETECTED.  The SARS-CoV-2 RNA is generally detectable in upper respiratory specimens during the acute phase of infection. The lowest concentration of SARS-CoV-2 viral copies this assay can detect is 138 copies/mL. A negative result does not preclude SARS-Cov-2 infection and should not be used as the sole basis for treatment or other patient management decisions. A negative result may occur with  improper specimen collection/handling, submission of specimen other than nasopharyngeal swab, presence of viral mutation(s) within the areas targeted by this assay, and inadequate number of viral copies(<138 copies/mL). A negative result must be combined with clinical observations, patient history, and epidemiological information. The  expected result is Negative.  Fact Sheet for Patients:  BloggerCourse.com  Fact Sheet for Healthcare Providers:  SeriousBroker.it  This test is no t yet approved or cleared by the [place] FDA and  has been authorized for detection and/or diagnosis of SARS-CoV-2 by FDA under an Emergency Use Authorization (EUA). This EUA will remain  in effect (meaning this test can be used) for the duration of the COVID-19 declaration under Section 564(b)(1) of the Act, 21 U.S.C.section 360bbb-3(b)(1), unless the authorization is terminated  or revoked sooner.       Influenza A by PCR NEGATIVE NEGATIVE Final   Influenza B by PCR NEGATIVE NEGATIVE Final    Comment: (NOTE) The Xpert Xpress SARS-CoV-2/FLU/RSV plus assay is intended as an aid in the diagnosis of influenza from Nasopharyngeal swab specimens and should not be used as a sole basis for treatment. Nasal washings and aspirates are unacceptable for Xpert Xpress SARS-CoV-2/FLU/RSV testing.  Fact Sheet for Patients: BloggerCourse.com  Fact Sheet for Healthcare Providers: SeriousBroker.it  This test is not yet approved or cleared by the [place] FDA and has been authorized for detection and/or diagnosis of SARS-CoV-2 by FDA under an Emergency Use Authorization (EUA). This EUA will remain in effect (meaning this test can be used) for the duration of the COVID-19 declaration under Section 564(b)(1) of the Act, 21 U.S.C. section 360bbb-3(b)(1), unless the authorization is terminated or revoked.     Resp Syncytial Virus by PCR NEGATIVE NEGATIVE Final    Comment: (NOTE) Fact Sheet for Patients: BloggerCourse.com  Fact Sheet for Healthcare Providers: SeriousBroker.it  This test is not yet approved or cleared by the [place] and has been authorized for detection and/or  diagnosis of SARS-CoV-2 by FDA under an Emergency Use Authorization (EUA). This EUA will remain in effect (meaning this test can be used) for the duration of the COVID-19 declaration under Section 564(b)(1) of the Act, 21 U.S.C. section 360bbb-3(b)(1), unless the authorization is terminated or revoked.  Performed at [facility], [address] [place], [place] [address]   Blood Culture (routine x 2)     Status: None (Preliminary result)   Collection Time: 06/12/23 10:20 AM   Specimen: BLOOD  Result Value Ref Range Status   Specimen Description BLOOD BLOOD LEFT ARM  Final   Special Requests   Final    BOTTLES DRAWN AEROBIC AND ANAEROBIC Blood Culture adequate volume   Culture   Final    NO GROWTH 2 DAYS Performed at [facility], [address] [place], [place] [address]    Report Status PENDING  Incomplete  Blood Culture (routine x 2)     Status: None (Preliminary result)   Collection Time: 06/12/23  4:03 PM   Specimen: BLOOD  Result Value Ref Range Status   Specimen Description BLOOD BLOOD RIGHT ARM  Final   Special Requests   Final    BOTTLES DRAWN AEROBIC AND ANAEROBIC Blood Culture results may not be optimal due to an excessive volume of blood received in culture bottles   Culture   Final    NO GROWTH 2 DAYS Performed at [facility], [address], [place], [place] [address]    Report Status PENDING  Incomplete      Radiology Studies last 3 days: [facility] Chest Port 1 View  Result Date: 06/12/2023 CLINICAL DATA:  Questionable sepsis. EXAM: PORTABLE CHEST 1 VIEW COMPARISON:  05/11/2023 FINDINGS: Rounded opacity at the right base, a known mass. Interstitial reticulation from emphysema. No acute opacity, effusion, or pneumothorax. Normal heart size and mediastinal contours. Porta catheter with tip at the upper SVC. IMPRESSION: 1. No acute finding when compared to prior. 2. Emphysema and mass at the right lung base. Electronically Signed   By:  Tiburcio Pea M.D.   On: 06/12/2023 10:46             LOS: 2 days      Sunnie Nielsen, DO [facility] 06/14/2023, 1:33 PM    Dictation software may have been used to generate the above note. Typos may occur and escape review in typed/dictated notes. Please contact Dr Lyn Hollingshead directly for clarity if needed.  Staff may message me via secure chat in Epic  but this may not receive an immediate response,  please page me for urgent matters!  If 7PM-7AM, please contact night coverage [email or web address]

## 2023-06-14 NOTE — Progress Notes (Signed)
Patient awake and alert x4. Administered duo neb upon arrival to pacu, remains with exp wheezes. Patient is on oxygen OTC, pt states at home 2 liters in hospital 4 liters [place]. Of note:  right pedal area red, warm to touch, patient states he doesn't remember foot being red?? Dr. Tonna Boehringer made aware, will see patient after clinic. Afebrile

## 2023-06-14 NOTE — Discharge Instructions (Addendum)
Some PCP options in [place] area- not a comprehensive list  [facility]- [phone] [facility]- [phone] [facility]- [phone] [facility]- [phone] [facility]- [phone] [facility]- [phone]  or [facility] Physician Referral Line [phone]   Transportation Resources  Agency Name: [facility] Agency Address: [address] [place], [place] [address] Phone: [phone] Email: [email or web address] Website: [email or web address] Service(s) Offered: Housing services, self-sufficiency, congregate meal program, weatherization program, Field seismologist program, emergency food assistance,  housing counseling, home ownership program, wheels-towork program.  Agency Name: [facility] [employer] [phone]) Address: [address], [place], [place] [address] Phone: [phone] Website: [email or web address] Service(s) Offered: Transportation for [employer], subscription and demand response; Dial-a-Ride for citizens 2 years of age or older.  Agency Name: Department of Social Services Address: [address] [place], [place] [address] Phone: [phone] Service(s) Offered: Child support services; child welfare services; food stamps; Medicaid; work first family assistance; and aid with fuel,  rent, food and medicine, transportation assistance.  Agency Name: [employer]abled [employer] ([employer]) Transportation  Network Phone: [phone] Service(s) Offered: Transports veterans to the [facility] medical center. Call  forty-eight hours in advance and leave the name, telephone  number, date, and time of appointment. Veteran will be  contacted by the driver the day before the appointment to  arrange a pick up point

## 2023-06-14 NOTE — Interval H&P Note (Signed)
No change. OK to proceed.

## 2023-06-14 NOTE — Anesthesia Preprocedure Evaluation (Signed)
Anesthesia Evaluation  Patient identified by MRN, date of birth, ID band Patient awake    Reviewed: Allergy & Precautions, H&P , NPO status , Patient's Chart, lab work & pertinent test results  Airway Mallampati: II  TM Distance: >3 FB Neck ROM: full    Dental  (+) Chipped, Dental Advidsory Given   Pulmonary neg shortness of breath, asthma , COPD,  COPD inhaler and oxygen dependent, neg recent URI, Patient abstained from smoking., former smoker Idiopathic pulmonary fibrosis, chronic respiratory failure    + decreased breath sounds+ wheezing (scattered wheeze)      Cardiovascular hypertension, (-) angina + CAD  (-) dysrhythmias (-) Valvular Problems/Murmurs Rhythm:regular     Neuro/Psych negative neurological ROS  negative psych ROS   GI/Hepatic hiatal hernia,GERD  ,,(+)     substance abuse  alcohol use  Endo/Other  negative endocrine ROS    Renal/GU negative Renal ROS  negative genitourinary   Musculoskeletal   Abdominal   Peds  Hematology negative hematology ROS (+)   Anesthesia Other Findings Past Medical History: No date: COPD (chronic obstructive pulmonary disease) (HCC) No date: GERD (gastroesophageal reflux disease) No date: Hyperlipidemia No date: Hypertension 11/2015: Pulmonary fibrosis (HCC)  Past Surgical History: No date: COLONOSCOPY No date: CORONARY STENT PLACEMENT 09/23/2016: ESOPHAGOGASTRODUODENOSCOPY (EGD) WITH PROPOFOL; N/A     Comment:  Procedure: ESOPHAGOGASTRODUODENOSCOPY (EGD) WITH               PROPOFOL;  Surgeon: Wyline Mood, MD;  Location: [facility]               ENDOSCOPY;  Service: Endoscopy;  Laterality: N/A; No date: SHOULDER ACROMIOPLASTY  BMI    Body Mass Index: 33.53 kg/m      Reproductive/Obstetrics negative OB ROS                             Anesthesia Physical Anesthesia Plan  ASA: 3  Anesthesia Plan: General   Post-op Pain Management:     Induction: Intravenous  PONV Risk Score and Plan: 2 and Propofol infusion and TIVA  Airway Management Planned: Natural Airway and Simple Face Mask  Additional Equipment:   Intra-op Plan:   Post-operative Plan:   Informed Consent: I have reviewed the patients History and Physical, chart, labs and discussed the procedure including the risks, benefits and alternatives for the proposed anesthesia with the patient or authorized representative who has indicated his/her understanding and acceptance.     Dental Advisory Given  Plan Discussed with: Anesthesiologist, CRNA and Surgeon  Anesthesia Plan Comments:         Anesthesia Quick Evaluation

## 2023-06-14 NOTE — Progress Notes (Addendum)
   06/14/23 1720  Vitals  Temp 99.5 F (37.5 C)  Temp Source Oral  BP (!) 171/83  MAP (mmHg) 107  BP Location Left Arm  BP Method Automatic  Patient Position (if appropriate) Lying  Pulse Rate (!) 117  Pulse Rate Source Dinamap  Resp 18  Level of Consciousness  Level of Consciousness Alert  MEWS COLOR  MEWS Score Color Yellow  Oxygen Therapy  SpO2 93 %  O2 Device Nasal Cannula  O2 Flow Rate (L/min) 4 L/min  MEWS Score  MEWS Temp 0  MEWS Systolic 0  MEWS Pulse 2  MEWS RR 0  MEWS LOC 0  MEWS Score 2   Dr. Lyn Hollingshead notified; continue to monitor at this time

## 2023-06-14 NOTE — Progress Notes (Signed)
   06/14/23 1211  Spiritual Encounters  Type of Visit Attempt (pt unavailable)  Reason for visit Advance directives  OnCall Visit Yes  Advance Directives (For Healthcare)  Would patient like information on creating a medical advance directive? No - Patient declined   Chaplain went by room responding to request for information on Advance Directives. Patient not available at time of visit. Will check back later.

## 2023-06-14 NOTE — Progress Notes (Deleted)
   06/14/23 1735  Pain Assessment  Pain Scale 0-10  Pain Score 9  Pain Type Surgical pain  Pain Location Neck  Pain Orientation Right  Pain Radiating Towards back  Pain Descriptors / Indicators Aching  Pain Frequency Intermittent  Pain Onset Progressive  Pain Intervention(s) Medication (See eMAR)  Provider Notification  Provider Name/Title Dr. Lyn Hollingshead  Date Provider Notified 06/14/23  Time Provider Notified 1725  Method of Notification Page (secure chat)  Notification Reason Other (Comment) (tachycardic/yellow MEWS)  Provider response Other (Comment) (acknowledged)  Date of Provider Response 06/14/23  Time of Provider Response 1725

## 2023-06-14 NOTE — Transfer of Care (Signed)
Immediate Anesthesia Transfer of Care Note  Patient: Austin Patterson  Procedure(s) Performed: REMOVAL PORT-A-CATH  Patient Location: PACU  Anesthesia Type:MAC  Level of Consciousness: awake and alert   Airway & Oxygen Therapy: Patient Spontanous Breathing and Patient connected to face mask oxygen  Post-op Assessment: Report given to RN and Post -op Vital signs reviewed and stable  Post vital signs: Reviewed and stable  Last Vitals:  Vitals Value Taken Time  BP 101/66 06/14/23 1308  Temp    Pulse 101 06/14/23 1309  Resp 24 06/14/23 1309  SpO2 96 % 06/14/23 1309  Vitals shown include unfiled device data.  Last Pain:  Vitals:   06/14/23 1145  TempSrc: Oral  PainSc: 6          Complications: No notable events documented.

## 2023-06-14 NOTE — Telephone Encounter (Signed)
Pt called in to report he is admitted in the hospital for port infection and will be unable to make his appt this afternoon. Wanted to let us know to cancel his appt.

## 2023-06-14 NOTE — TOC Initial Note (Addendum)
Transition of Care [facility]) - Initial/Assessment Note    Patient Details  Name: Austin Patterson MRN: [id number] Date of Birth: 09/22/1956  Transition [facility]) CM/SW Contact:    Margarito Liner, LCSW Phone Number: 06/14/2023, 11:22 AM  Clinical Narrative: Readmission prevention screen complete. CSW met with patient. No supports at bedside. CSW introduced role and explained that discharge planning would be discussed. Patient does not have a PCP. CSW added list to his AVS. Patient was driving himself to appointments until recently. CSW added transportation resources to his AVS and will look up information for transportation through [facility]. Patient lives home alone. He stated his son lives in [place]. No home health prior to admission. He has a RW, rollator, walking stick, SPC, and shower chair at home. He is on 2 L chronic oxygen at home through [employer]. Patient is currently on 4 L. Patient confirmed he will need [employer] to bring a tank to the hospital for the ride home and that his landlord would transport him at discharge. No further concerns. CSW encouraged patient to contact CSW as needed. CSW will continue to follow patient for support and facilitate return home once stable.               12:40 pm: CSW called [employer] who stated patient's orders are for 3 L. If patient needs more than that at discharge, will need DME order and sats note reflecting this.  Expected Discharge Plan: Home/Self Care Barriers to Discharge: Continued Medical Work up   Patient Goals and CMS Choice            Expected Discharge Plan and Services     Post Acute Care Choice: NA Living arrangements for the past 2 months: Single Family Home                                      Prior Living Arrangements/Services Living arrangements for the past 2 months: Single Family Home Lives with:: Self Patient language and need for interpreter reviewed:: Yes Do you feel safe going back to the place where  you live?: Yes      Need for Family Participation in Patient Care: Yes (Comment)   Current home services: DME Criminal Activity/Legal Involvement Pertinent to Current Situation/Hospitalization: No - Comment as needed  Activities of Daily Living Home Assistive Devices/Equipment: Walker (specify type), Eyeglasses ADL Screening (condition at time of admission) Patient's cognitive ability adequate to safely complete daily activities?: Yes Is the patient deaf or have difficulty hearing?: No Does the patient have difficulty seeing, even when wearing glasses/contacts?: No Does the patient have difficulty concentrating, remembering, or making decisions?: No Patient able to express need for assistance with ADLs?: No Does the patient have difficulty dressing or bathing?: No Independently performs ADLs?: Yes (appropriate for developmental age) Does the patient have difficulty walking or climbing stairs?: Yes Weakness of Legs: Both Weakness of Arms/Hands: None  Permission Sought/Granted                  Emotional Assessment Appearance:: Appears stated age Attitude/Demeanor/Rapport: Engaged, Gracious Affect (typically observed): Accepting, Appropriate, Calm, Pleasant Orientation: : Oriented to Self, Oriented to Place, Oriented to  Time, Oriented to Situation Alcohol / Substance Use: Not Applicable Psych Involvement: No (comment)  Admission diagnosis:  Infected venous access port, initial encounter [T80.219A] Sepsis (HCC) [A41.9] Sepsis, due to unspecified organism, unspecified whether acute organ dysfunction present (  HCC) [A41.9] Patient Active Problem List   Diagnosis Date Noted   Sepsis (HCC) 06/12/2023   Iron deficiency anemia 06/08/2023   Encounter for antineoplastic chemotherapy 06/01/2023   Stage IV adenocarcinoma of lung (HCC) 05/18/2023   Swelling of right foot 05/11/2023   Heterozygous alpha 1-antitrypsin deficiency (HCC) 04/27/2023   Mediastinal adenopathy 04/13/2023    Cough 04/03/2023   Alcohol use 04/03/2023   Constipation 12/10/2022   COPD exacerbation (HCC) 02/23/2022   GERD without esophagitis 02/23/2022   Alpha-1-antitrypsin deficiency carrier 01/10/2021   History of nonmelanoma skin cancer 12/19/2020   Asthma 03/12/2020   Chronic respiratory failure with hypoxia (HCC) 03/12/2020   Hyperlipidemia    Asthma-COPD overlap syndrome    Bleeding nose    Alcohol abuse    RUQ abdominal pain    Anaphylaxis 11/01/2019   Hypotension 11/01/2019   CAD (coronary artery disease) 11/01/2019   H/O heart artery stent 11/01/2019   Acute on chronic respiratory failure with hypoxia (HCC)    RUQ pain    Essential hypertension    IPF (idiopathic pulmonary fibrosis) (HCC) 08/30/2019   Hypomagnesemia 03/31/2017   Hiatal hernia    Reflux esophagitis    Hematemesis without nausea    Hyponatremia 09/21/2016   Multifocal pneumonia 12/09/2015   PCP:  Pcp, No Pharmacy:   [id number] [place], [place] - [address] AT [facility] [address] [place] [address] Phone: [phone] Fax: [phone]  [facility] - [place], [place] - [address] [place] [address] Phone: [phone] Fax: [phone]  [facility] - [place], [place] - [address] [place] [address] Phone: [phone] Fax: [phone]  [facility] - [address] [place] - [address] [place] [address] Phone: [phone] Fax: [phone]     Social Determinants of Health (SDOH) Social History: SDOH Screenings   Food Insecurity: No Food Insecurity (06/12/2023)  Housing: Low Risk  (06/12/2023)  Transportation Needs: No Transportation Needs (06/12/2023)  Utilities: Not At Risk (06/12/2023)  Tobacco Use: Medium Risk (06/12/2023)   SDOH Interventions:     Readmission Risk Interventions    06/14/2023   11:19 AM  Readmission  Risk Prevention Plan  Transportation Screening Complete  Medication Review (RN Care Manager) Complete  PCP or Specialist appointment within 3-5 days of discharge Complete  SW Recovery Care/Counseling Consult Complete  Palliative Care Screening Not Applicable  Skilled Nursing Facility Not Applicable

## 2023-06-14 NOTE — Progress Notes (Signed)
Patient awake/alert x4.  Transferred from pacu to room 218. Belongings brought from 238 to room 218, wallet in bag, patient made aware.

## 2023-06-15 ENCOUNTER — Inpatient Hospital Stay: Payer: Medicare HMO

## 2023-06-15 ENCOUNTER — Encounter: Payer: Self-pay | Admitting: Internal Medicine

## 2023-06-15 ENCOUNTER — Encounter: Payer: Self-pay | Admitting: Surgery

## 2023-06-15 DIAGNOSIS — J9621 Acute and chronic respiratory failure with hypoxia: Secondary | ICD-10-CM

## 2023-06-15 DIAGNOSIS — J4489 Other specified chronic obstructive pulmonary disease: Secondary | ICD-10-CM

## 2023-06-15 DIAGNOSIS — A419 Sepsis, unspecified organism: Secondary | ICD-10-CM | POA: Diagnosis not present

## 2023-06-15 DIAGNOSIS — C3491 Malignant neoplasm of unspecified part of right bronchus or lung: Secondary | ICD-10-CM

## 2023-06-15 LAB — MYCOPLASMA PNEUMONIAE ANTIBODY, IGM: Mycoplasma pneumo IgM: <770 U/mL (ref 0–769)

## 2023-06-15 LAB — LEGIONELLA PNEUMOPHILA SEROGP 1 UR AG: L. pneumophila Serogp 1 Ur Ag: NEGATIVE

## 2023-06-15 LAB — MRSA NEXT GEN BY PCR, NASAL: MRSA by PCR Next Gen: NOT DETECTED

## 2023-06-15 LAB — PROCALCITONIN: Procalcitonin: 0.12 ng/mL

## 2023-06-15 MED ORDER — ORAL CARE MOUTH RINSE: 15.0000 mL | OROMUCOSAL | Status: DC | PRN

## 2023-06-15 MED ORDER — METHYLPREDNISOLONE SODIUM SUCC 125 MG IJ SOLR
125.0000 mg | Freq: Once | INTRAMUSCULAR | Status: AC
  Administered 2023-06-15: 125 mg via INTRAVENOUS
  Filled 2023-06-15: qty 2

## 2023-06-15 MED ORDER — HYDROCOD POLI-CHLORPHE POLI ER 10-8 MG/5ML PO SUER
5.0000 mL | Freq: Two times a day (BID) | ORAL | Status: DC | PRN
  Administered 2023-06-16 – 2023-06-22 (×7): 5 mL via ORAL
  Filled 2023-06-15 (×8): qty 5

## 2023-06-15 NOTE — Progress Notes (Signed)
Subjective:  CC: Austin Patterson is a 67 y.o. male  Hospital stay day 3, 1 Day Post-Op port removal  HPI: No complaints at site.  ROS:  General: Denies weight loss, weight gain, fatigue, fevers, chills, and night sweats. Heart: Denies chest pain, palpitations, racing heart, irregular heartbeat, leg pain or swelling, and decreased activity tolerance. Respiratory: Denies breathing difficulty, shortness of breath, wheezing, cough, and sputum. GI: Denies change in appetite, heartburn, nausea, vomiting, constipation, diarrhea, and blood in stool. GU: Denies difficulty urinating, pain with urinating, urgency, frequency, blood in urine.   Objective:   Temp:  [97.5 F (36.4 C)-99.5 F (37.5 C)] 98.9 F (37.2 C) (08/27 1240) Pulse Rate:  [97-126] 101 (08/27 1240) Resp:  [18-23] 20 (08/27 1240) BP: (96-171)/(68-96) 154/82 (08/27 1240) SpO2:  [91 %-98 %] 92 % (08/27 1254) FiO2 (%):  [36 %] 36 % (08/27 0208)     Height: 5' 5.98" (167.6 cm) Weight: 88.9 kg BMI (Calculated): 31.65   Intake/Output this shift:   Intake/Output Summary (Last 24 hours) at 06/15/2023 1312 Last data filed at 06/15/2023 0600 Gross per 24 hour  Intake 375 ml  Output 1000 ml  Net -625 ml    Constitutional :  alert, cooperative, appears stated age, and no distress  Respiratory:  clear to auscultation bilaterally  Cardiovascular:  regular rate and rhythm  Gastrointestinal: soft, non-tender; bowel sounds normal; no masses,  no organomegaly.   Skin: Cool and moist. Port site dressing c/d/i  Psychiatric: Normal affect, non-agitated, not confused       LABS:     Latest Ref Rng & Units 06/14/2023    4:19 PM 06/14/2023    4:46 AM 06/13/2023    4:52 AM  CMP  Glucose 70 - 99 mg/dL  324  401   BUN 8 - 23 mg/dL  11  13   Creatinine 0.27 - 1.24 mg/dL 2.53  6.64  4.03   Sodium 135 - 145 mmol/L  136  132   Potassium 3.5 - 5.1 mmol/L  4.2  3.5   Chloride 98 - 111 mmol/L  101  101   CO2 22 - 32 mmol/L  27  23    Calcium 8.9 - 10.3 mg/dL  7.5  6.9       Latest Ref Rng & Units 06/14/2023    4:19 PM 06/14/2023    4:46 AM 06/13/2023    2:02 PM  CBC  WBC 4.0 - 10.5 K/uL 14.6  16.0    Hemoglobin 13.0 - 17.0 g/dL 8.2  8.0  7.8   Hematocrit 39.0 - 52.0 % 26.7  26.4  25.1   Platelets 150 - 400 K/uL 438  441      RADS: N/a Assessment:   S/p infected port removal.  Stable.  Ok to resume anticoagulation.  First dressing change tomorrow.  labs/images/medications/previous chart entries reviewed personally and relevant changes/updates noted above.

## 2023-06-15 NOTE — Care Management Important Message (Signed)
Important Message  Patient Details  Name: Austin Patterson MRN: [id number] Date of Birth: 02-Dec-1955   Medicare Important Message Given:  Yes     [name] 06/15/2023, 10:59 AM

## 2023-06-15 NOTE — Progress Notes (Signed)
PROGRESS NOTE    Austin Patterson   QMV:[id number] DOB: [birth year]  DOA: 06/12/2023 Date of Service: 06/15/23 PCP: Oneita Hurt, No     Brief Narrative / Hospital Course:  Austin Patterson is a 67 y.o. 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 Dx stage IV lung cancer this summer, started chemotherapy 3 weeks ago. 2 days ago he started to have a dry cough and wheezing, when he increased his breathing medications. Last night he started to have fever and chills and decided to come to the hospital 08/24.  08/24: to ED from home. Fever 100.3, tachycardia was found in A-fib with RVR heart rate 170-190 and responded to IV bolus of Cardizem, nonhypotensive nonhypoxic.  X-ray shows no acute infiltrates but no mass on the right lung base.  Blood work showed WBC 22.7, hemoglobin 11.0, creatinine 0.6. Started on vanc, cefepime, azithromycin. Surgical consult - hold plavix, abx, consider removal in few days / sooner if needed 08/25: not meeting sepsis criteria this morning, though still significant leukocytosis 22.5. Plan port-a-cath removal tomorrow  08/26: port-a-cath removal mid-day. BCx NGx2d. WBC down to 16, Hgb better at 8.0. Pot-a-cath removed today. Clinical cellulitis still significant nd new area suspected cellulitis on R foot dorsum, will keep on IV abx through next 24-48 hours  08/27: pt reports more SOB today. Lung sounds coarse/wheezing. CXR on personal read about same as couple days ago, starting steroids, he is already on broad spectrum abx, nebs.   Consultants:  General surgery   Procedures: Removal port cath 06/14/23 w/ Dr Tonna Boehringer        ASSESSMENT & PLAN:   Principal Problem:   Sepsis (HCC) Active Problems:   Acute on chronic respiratory failure with hypoxia (HCC)   Asthma-COPD overlap syndrome   Stage IV adenocarcinoma of lung  (HCC)   Sepsis d/t infection of port-a-cath, POA Question cellulitis dorsum R foot fever, leukocytosis, source infection likely right-sided chest abscess and underneath soft tissue infection associated with port cath.   Atypical pneumonia cannot be ruled out at this point either.   Extended abx coverage with vancomycin, ceftriaxone and azithromycin Blood culture --> NGx2d MRSA screening --> pending port-a-cath removal today 08/26 Clinical cellulitis still significant, will keep on IV abx through next 24-48 hours  If erythema and tenderness on R foot dorsum not improving will consider imaging this area --> question fluctuance, will obtain XR and [place] to eval further   New Onset A-fib with RVR - resolved Resolved, repeat EKG showed sinus rhythm CHADS2VASC = 4 Cardiology Dr. 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  Continue ASA   Acute COPD exacerbation Acute hypoxic respiratory failure  Pulmonary Fibrosis  Supplemental O2 Antibiotics as above cover pneumonia  p.o. 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 Also discussed CPR. He reports he would not want chest compressions or life support. DNR order updated in chart. Pt has capacity to make decision     DVT prophylaxis: lovenox d/c d/t anemia, SCD initiated  Pertinent IV fluids/nutrition: no iv fluids at this time, NPO p mn for procedure tomorrow  Central lines / invasive devices: port-a-cath as above  Code Status: FULL CODE ACP documentation reviewed: none on file   Current Admission Status: inpatient  TOC needs / Dispo plan: TBD Barriers to discharge / significant pending items: clinical improvement, worsening SOB, needing IV abx, anticipate here few more days              Subjective / Brief ROS:  Pt reports more SOB today, frustrated w/ coughing. No headache/dizziness, nochest pain. Pain in foot same from yesterday   Family Communication: none at this time     Objective Findings:  Vitals:   06/15/23 0800 06/15/23 1240 06/15/23 1251 06/15/23 1254  BP:  (!) 154/82    Pulse:  (!) 101    Resp:  20    Temp:  98.9 F (37.2 C)    TempSrc:  Oral    SpO2: 92% 96% 92% 92%  Weight:      Height:        Intake/Output Summary (Last 24 hours) at 06/15/2023 1300 Last data filed at 06/15/2023 0600 Gross per 24 hour  Intake 375 ml  Output 1000 ml  Net -625 ml   Filed Weights   06/12/23 1745  Weight: 88.9 kg    Examination:  Physical Exam Constitutional:      General: He is not in acute distress. Cardiovascular:     Rate and Rhythm: Normal rate and regular rhythm.  Pulmonary:     Effort: Pulmonary effort is normal.     Breath sounds: Normal breath sounds.  Chest:     Comments: R chest all at site of port-a-cath is erythematous and tender, c/w photograph on file  from yesterday, appears same as yesterday  Abdominal:     Palpations: Abdomen is soft.  Musculoskeletal:     Right lower leg: No edema.     Left lower leg: No edema.  Skin:    General: Skin is warm and dry.     Comments: See "chest"  Neurological:     General: No focal deficit present.     Mental Status: He is alert.  Psychiatric:        Mood and Affect: Mood normal.        Behavior: Behavior normal.    PM 06/16/23 -appears same on exam 06/15/23    On admission       Scheduled Medications:   atorvastatin  40 mg Oral QHS   azithromycin  500 mg Oral Daily   enoxaparin (LOVENOX) injection  50 mg Subcutaneous Q24H   ferrous sulfate  325 mg Oral BID WC   gabapentin  300 mg Oral TID   ipratropium-albuterol  3 mL Nebulization Q6H  metoprolol tartrate  25 mg Oral BID   mometasone-formoterol  2 puff Inhalation BID   montelukast  10 mg Oral Daily   pantoprazole  40 mg Oral BID   senna-docusate  1 tablet Oral BID   tamsulosin  0.4 mg Oral QPC supper    Continuous Infusions:  cefTRIAXone (ROCEPHIN)  IV 2 g (06/15/23 1249)   vancomycin 750 mg (06/15/23 1244)    PRN Medications:  acetaminophen, albuterol, chlorpheniramine-HYDROcodone, guaiFENesin-dextromethorphan, metoprolol tartrate, Muscle Rub, ondansetron, mouth rinse, oxyCODONE, prochlorperazine  Antimicrobials from admission:  Anti-infectives (From admission, onward)    Start     Dose/Rate Route Frequency Ordered Stop   06/13/23 1000  azithromycin (ZITHROMAX) tablet 500 mg        500 mg Oral Daily 06/12/23 1247     06/12/23 2300  vancomycin (VANCOREADY) IVPB 750 mg/150 mL        750 mg 150 mL/hr over 60 Minutes Intravenous Every 12 hours 06/12/23 1241     06/12/23 1400  cefTRIAXone (ROCEPHIN) 2 g in sodium chloride 0.9 % 100 mL IVPB        2 g 200 mL/hr over 30 Minutes Intravenous Every 24 hours 06/12/23 1215     06/12/23 1015  vancomycin (VANCOREADY) IVPB 2000 mg/400 mL        2,000 mg 200 mL/hr over 120  Minutes Intravenous  Once 06/12/23 1005 06/12/23 1447   06/12/23 1015  ceFEPIme (MAXIPIME) 2 g in sodium chloride 0.9 % 100 mL IVPB        2 g 200 mL/hr over 30 Minutes Intravenous  Once 06/12/23 1005 06/12/23 1108   06/12/23 1015  azithromycin (ZITHROMAX) 500 mg in sodium chloride 0.9 % 250 mL IVPB        500 mg 250 mL/hr over 60 Minutes Intravenous  Once 06/12/23 1005 06/12/23 1207   06/12/23 1000  vancomycin (VANCOCIN) IVPB 1000 mg/200 mL premix  Status:  Discontinued        1,000 mg 200 mL/hr over 60 Minutes Intravenous  Once 06/12/23 0958 06/12/23 1005   06/12/23 1000  levofloxacin (LEVAQUIN) IVPB 750 mg  Status:  Discontinued        750 mg 100 mL/hr over 90 Minutes Intravenous  Once 06/12/23 0958 06/12/23 1005           Data Reviewed:  I have personally reviewed the following...  CBC: Recent Labs  Lab 06/12/23 1020 06/13/23 0452 06/13/23 1402 06/14/23 0446 06/14/23 1619  WBC 22.7* 22.5*  --  16.0* 14.6*  NEUTROABS 18.9*  --   --   --   --   HGB 9.0* 7.4* 7.8* 8.0* 8.2*  HCT 28.7* 22.5* 25.1* 26.4* 26.7*  MCV 87.8 84.3  --  88.0 87.3  PLT 368 381  --  441* 438*   Basic Metabolic Panel: Recent Labs  Lab 06/12/23 1020 06/13/23 0452 06/14/23 0446 06/14/23 1619  NA 133* 132* 136  --   K 3.3* 3.5 4.2  --   CL 95* 101 101  --   CO2 25 23 27   --   GLUCOSE 144* 126* 129*  --   BUN 10 13 11   --   CREATININE 0.69 0.65 0.70 0.61  CALCIUM 7.6* 6.9* 7.5*  --    GFR: Estimated Creatinine Clearance: 93.5 mL/min (by C-G formula based on SCr of 0.61 mg/dL). Liver Function Tests: Recent Labs  Lab 06/12/23 1020  AST 20  ALT 18  ALKPHOS 106  BILITOT 0.7  PROT 7.0  ALBUMIN 3.6   No results for input(s): "LIPASE", "AMYLASE" in the last 168 hours. No results for input(s): "AMMONIA" in the last 168 hours. Coagulation Profile: Recent Labs  Lab 06/12/23 1020  INR 1.2   Cardiac Enzymes: No results for input(s): "CKTOTAL", "CKMB", "CKMBINDEX", "TROPONINI" in the  last 168 hours. BNP (last 3 results) No results for input(s): "PROBNP" in the last 8760 hours. HbA1C: No results for input(s): "HGBA1C" in the last 72 hours. CBG: No results for input(s): "GLUCAP" in the last 168 hours. Lipid Profile: No results for input(s): "CHOL", "HDL", "LDLCALC", "TRIG", "CHOLHDL", "LDLDIRECT" in the last 72 hours. Thyroid Function Tests: No results for input(s): "TSH", "T4TOTAL", "FREET4", "T3FREE", "THYROIDAB" in the last 72 hours. Anemia Panel: No results for input(s): "VITAMINB12", "FOLATE", "FERRITIN", "TIBC", "IRON", "RETICCTPCT" in the last 72 hours. Most Recent Urinalysis On File:     Component Value Date/Time   COLORURINE YELLOW (A) 06/12/2023 1942   APPEARANCEUR CLEAR (A) 06/12/2023 1942   LABSPEC 1.021 06/12/2023 1942   PHURINE 5.0 06/12/2023 1942   GLUCOSEU >=500 (A) 06/12/2023 1942   HGBUR NEGATIVE 06/12/2023 1942   BILIRUBINUR NEGATIVE 06/12/2023 1942   KETONESUR NEGATIVE 06/12/2023 1942   PROTEINUR NEGATIVE 06/12/2023 1942   NITRITE NEGATIVE 06/12/2023 1942   LEUKOCYTESUR NEGATIVE 06/12/2023 1942   Sepsis Labs: @LABRCNTIP (procalcitonin:4,lacticidven:4) Microbiology: Recent Results (from the past 240 hour(s))  Resp panel by RT-PCR (RSV, Flu A&B, Covid) Anterior Nasal Swab     Status: None   Collection Time: 06/12/23 10:20 AM   Specimen: Anterior Nasal Swab  Result Value Ref Range Status   SARS Coronavirus 2 by RT PCR NEGATIVE NEGATIVE Final    Comment: (NOTE) SARS-CoV-2 target nucleic acids are NOT DETECTED.  The SARS-CoV-2 RNA is generally detectable in upper respiratory specimens during the acute phase of infection. The lowest concentration of SARS-CoV-2 viral copies this assay can detect is 138 copies/mL. A negative result does not preclude SARS-Cov-2 infection and should not be used as the sole basis for treatment or other patient management decisions. A negative result may occur with  improper specimen collection/handling,  submission of specimen other than nasopharyngeal swab, presence of viral mutation(s) within the areas targeted by this assay, and inadequate number of viral copies(<138 copies/mL). A negative result must be combined with clinical observations, patient history, and epidemiological information. The expected result is Negative.  Fact Sheet for Patients:  BloggerCourse.com  Fact Sheet for Healthcare Providers:  SeriousBroker.it  This test is no t yet approved or cleared by the [place] FDA and  has been authorized for detection and/or diagnosis of SARS-CoV-2 by FDA under an Emergency Use Authorization (EUA). This EUA will remain  in effect (meaning this test can be used) for the duration of the COVID-19 declaration under Section 564(b)(1) of the Act, 21 U.S.C.section 360bbb-3(b)(1), unless the authorization is terminated  or revoked sooner.       Influenza A by PCR NEGATIVE NEGATIVE Final   Influenza B by PCR NEGATIVE NEGATIVE Final    Comment: (NOTE) The Xpert Xpress SARS-CoV-2/FLU/RSV plus assay is intended as an aid in the diagnosis of influenza from Nasopharyngeal swab specimens and should not be used as a sole basis for treatment. Nasal washings and aspirates are unacceptable for Xpert Xpress SARS-CoV-2/FLU/RSV testing.  Fact Sheet for Patients: BloggerCourse.com  Fact Sheet for Healthcare Providers: SeriousBroker.it  This test is not yet approved or cleared by the [place] FDA and has been authorized for detection and/or diagnosis of SARS-CoV-2 by FDA  under an Emergency Use Authorization (EUA). This EUA will remain in effect (meaning this test can be used) for the duration of the COVID-19 declaration under Section 564(b)(1) of the Act, 21 U.S.C. section 360bbb-3(b)(1), unless the authorization is terminated or revoked.     Resp Syncytial Virus by PCR NEGATIVE  NEGATIVE Final    Comment: (NOTE) Fact Sheet for Patients: BloggerCourse.com  Fact Sheet for Healthcare Providers: SeriousBroker.it  This test is not yet approved or cleared by the [place] FDA and has been authorized for detection and/or diagnosis of SARS-CoV-2 by FDA under an Emergency Use Authorization (EUA). This EUA will remain in effect (meaning this test can be used) for the duration of the COVID-19 declaration under Section 564(b)(1) of the Act, 21 U.S.C. section 360bbb-3(b)(1), unless the authorization is terminated or revoked.  Performed at [facility], [address] [place], [place] [address]   Blood Culture (routine x 2)     Status: None (Preliminary result)   Collection Time: 06/12/23 10:20 AM   Specimen: BLOOD  Result Value Ref Range Status   Specimen Description BLOOD BLOOD LEFT ARM  Final   Special Requests   Final    BOTTLES DRAWN AEROBIC AND ANAEROBIC Blood Culture adequate volume   Culture   Final    NO GROWTH 3 DAYS Performed at [facility], [address] [place], [place] [address]    Report Status PENDING  Incomplete  Blood Culture (routine x 2)     Status: None (Preliminary result)   Collection Time: 06/12/23  4:03 PM   Specimen: BLOOD  Result Value Ref Range Status   Specimen Description BLOOD BLOOD RIGHT ARM  Final   Special Requests   Final    BOTTLES DRAWN AEROBIC AND ANAEROBIC Blood Culture results may not be optimal due to an excessive volume of blood received in culture bottles   Culture   Final    NO GROWTH 3 DAYS Performed at [facility], [address], [place], [place] [address]    Report Status PENDING  Incomplete  Aerobic/Anaerobic Culture w Gram Stain (surgical/deep wound)     Status: None (Preliminary result)   Collection Time: 06/14/23 12:52 PM   Specimen: Path Tissue  Result Value Ref Range Status   Specimen Description WOUND  Final    Special Requests PORT  Final   Gram Stain NO WBC SEEN NO ORGANISMS SEEN   Final   Culture   Final    NO GROWTH < 12 HOURS Performed at [facility], [address] [place], [place] [address]    Report Status PENDING  Incomplete      Radiology Studies last 3 days: DG Chest Port 1 View  Result Date: 06/12/2023 CLINICAL DATA:  Questionable sepsis. EXAM: PORTABLE CHEST 1 VIEW COMPARISON:  05/11/2023 FINDINGS: Rounded opacity at the right base, a known mass. Interstitial reticulation from emphysema. No acute opacity, effusion, or pneumothorax. Normal heart size and mediastinal contours. Porta catheter with tip at the upper SVC. IMPRESSION: 1. No acute finding when compared to prior. 2. Emphysema and mass at the right lung base. Electronically Signed   By: Tiburcio Pea M.D.   On: 06/12/2023 10:46             LOS: 3 days      Sunnie Nielsen, DO [facility] 06/15/2023, 1:00 PM    Dictation software may have been used to generate the above note. Typos may occur and escape review in typed/dictated  notes. Please contact Dr Lyn Hollingshead directly for clarity if needed.  Staff may message me via secure chat in Epic  but this may not receive an immediate response,  please page me for urgent matters!  If 7PM-7AM, please contact night coverage [email or web address]

## 2023-06-15 NOTE — Progress Notes (Signed)
Mobility Specialist - Progress Note   06/15/23 1000  Mobility  Activity Dangled on edge of bed;Turned to right side;Turned to left side;Turned to back - supine  Level of Assistance Minimal assist, patient does 75% or more  Assistive Device None  Activity Response Tolerated well  $Mobility charge 1 Mobility  Mobility Specialist Start Time (ACUTE ONLY) [id number]  Mobility Specialist Stop Time (ACUTE ONLY) 1022  Mobility Specialist Time Calculation (min) (ACUTE ONLY) 40 min   Pt supine upon entry, utilizing RA. Pt expressed "I don't walk" and " I'll need some time before I try anything"--Pt denied OOB amb this date. MS completed bath, linen change ( RN request). Pt transferred EOB MinA for trunk support. Pt sat EOB while MS continued bath, returned supine, left with alarm set and needs within reach. RN notified.  Zetta Bills Mobility Specialist 06/15/23 10:33 AM

## 2023-06-15 NOTE — Progress Notes (Signed)
   06/15/23 1100  Spiritual Encounters  Type of Visit Initial  Care provided to: Patient  Referral source Patient request  Reason for visit Advance directives  OnCall Visit No  Spiritual Framework  Presenting Themes Caregiving needs  Patient Stress Factors Health changes  Family Stress Factors None identified  Interventions  Spiritual Care Interventions Made Established relationship of care and support;Encouragement;Compassionate presence;Decision-making support/facilitation  Intervention Outcomes  Outcomes Awareness around self/spiritual resourses;Reduced anxiety  Spiritual Care Plan  Spiritual Care Issues Still Outstanding No further spiritual care needs at this time (see row info)   Chaplain received a spiritual consult for an advance directive. Chaplain met with the patient to discuss the documents and had it notarized and signed by two witnesses. The patient was very grateful. He told the chaplain that he has been trying to get it done on his own for the past month. Chaplain services is available for spiritual and emotional support.

## 2023-06-15 NOTE — Consult Note (Signed)
Pharmacy Antibiotic Note  Austin Patterson is a 67 y.o. 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 2 days ago he started to have a dry cough and wheezing. Concern for cellulitis/abscess on chest plus pneumonia. 8/26 port-a-cath removed. New onset clinical cellulitis on R foot dorsum. 8/27 pt reports more SOB and lung sounds. Afebrile, WBC 22 >> 14.6. Per MD foot looks "hot" with cellulitis. Pharmacy has been consulted for Vancomycin dosing.  Plan: Day 4 of antibiotics Continue vancomycin 750 mg IV Q12H. Goal AUC 400-550. Expected AUC: 467 SCr used: 0.8 Vd: 0.5  Patient is also on Azithromycin 500 mg IV Q24H Patient is also on Ceftriaxone 2 g Q24H Continue to monitor renal function and follow culture results  Height: 5' 5.98" (167.6 cm) Weight: 88.9 kg (195 lb 15.8 oz) IBW/kg (Calculated) : 63.76  Temp (24hrs), Avg:98.2 F (36.8 C), Min:97.5 F (36.4 C), Max:99.5 F (37.5 C)  Recent Labs  Lab 06/08/23 1104 06/12/23 1020 06/12/23 1603 06/13/23 0452 06/14/23 0446 06/14/23 1619  WBC 11.7* 22.7*  --  22.5* 16.0* 14.6*  CREATININE 0.97 0.69  --  0.65 0.70 0.61  LATICACIDVEN  --  1.2 1.1  --   --   --     Estimated Creatinine Clearance: 93.5 mL/min (by C-G formula based on SCr of 0.61 mg/dL).    Allergies  Allergen Reactions   Amoxicillin Anaphylaxis   Tizanidine     Feet and ankle swell    Antimicrobials this admission: Cefepime 2 g x 1 8/24  Dose adjustments this admission: NA  Microbiology results: 8/26 BCx: NGTD 8/24 BCx: NGTD   Thank you for allowing pharmacy to be a part of this patient's care.  Effie Shy PGY1 Pharmacy Resident  06/15/2023 7:13 AM

## 2023-06-15 NOTE — Progress Notes (Signed)
PT Cancellation Note  Patient Details Name: Austin Patterson MRN: [id number] DOB: 05/23/1956   Cancelled Treatment:    Reason Eval/Treat Not Completed: Other (comment).  Pt currently pending results of R foot DG.  Will hold PT eval at this time and re-attempt PT evaluation at a later date/time once results are known and pt is appropriate for physical therapy participation.  Hendricks Limes, PT 06/15/23, 2:24 PM

## 2023-06-16 DIAGNOSIS — D649 Anemia, unspecified: Secondary | ICD-10-CM

## 2023-06-16 DIAGNOSIS — I480 Paroxysmal atrial fibrillation: Secondary | ICD-10-CM

## 2023-06-16 DIAGNOSIS — J8410 Pulmonary fibrosis, unspecified: Secondary | ICD-10-CM

## 2023-06-16 DIAGNOSIS — J9600 Acute respiratory failure, unspecified whether with hypoxia or hypercapnia: Secondary | ICD-10-CM

## 2023-06-16 DIAGNOSIS — J441 Chronic obstructive pulmonary disease with (acute) exacerbation: Secondary | ICD-10-CM

## 2023-06-16 DIAGNOSIS — A419 Sepsis, unspecified organism: Secondary | ICD-10-CM | POA: Diagnosis not present

## 2023-06-16 DIAGNOSIS — I25118 Atherosclerotic heart disease of native coronary artery with other forms of angina pectoris: Secondary | ICD-10-CM | POA: Diagnosis not present

## 2023-06-16 LAB — MAGNESIUM: Magnesium: 1.5 mg/dL — ABNORMAL LOW (ref 1.7–2.4)

## 2023-06-16 LAB — BASIC METABOLIC PANEL
Anion gap: 7 (ref 5–15)
BUN: 10 mg/dL (ref 8–23)
CO2: 28 mmol/L (ref 22–32)
Calcium: 7 mg/dL — ABNORMAL LOW (ref 8.9–10.3)
Chloride: 98 mmol/L (ref 98–111)
Creatinine, Ser: 0.55 mg/dL — ABNORMAL LOW (ref 0.61–1.24)
GFR, Estimated: >60 mL/min (ref >60)
Glucose, Bld: 152 mg/dL — ABNORMAL HIGH (ref 70–99)
Potassium: 3.5 mmol/L (ref 3.5–5.1)
Sodium: 133 mmol/L — ABNORMAL LOW (ref 135–145)

## 2023-06-16 LAB — CBC WITH DIFFERENTIAL/PLATELET
Abs Immature Granulocytes: 0.37 10*3/uL — ABNORMAL HIGH (ref 0.00–0.07)
Basophils Absolute: 0 10*3/uL (ref 0.0–0.1)
Basophils Relative: 0 %
Eosinophils Absolute: 0 10*3/uL (ref 0.0–0.5)
Eosinophils Relative: 0 %
HCT: 24.7 % — ABNORMAL LOW (ref 39.0–52.0)
Hemoglobin: 8 g/dL — ABNORMAL LOW (ref 13.0–17.0)
Immature Granulocytes: 2 %
Lymphocytes Relative: 4 %
Lymphs Abs: 0.7 10*3/uL (ref 0.7–4.0)
MCH: 27.3 pg (ref 26.0–34.0)
MCHC: 32.4 g/dL (ref 30.0–36.0)
MCV: 84.3 fL (ref 80.0–100.0)
Monocytes Absolute: 1 10*3/uL (ref 0.1–1.0)
Monocytes Relative: 5 %
Neutro Abs: 16.6 10*3/uL — ABNORMAL HIGH (ref 1.7–7.7)
Neutrophils Relative %: 89 %
Platelets: 613 10*3/uL — ABNORMAL HIGH (ref 150–400)
RBC: 2.93 MIL/uL — ABNORMAL LOW (ref 4.22–5.81)
RDW: 18.1 % — ABNORMAL HIGH (ref 11.5–15.5)
WBC: 18.8 10*3/uL — ABNORMAL HIGH (ref 4.0–10.5)
nRBC: 0 % (ref 0.0–0.2)

## 2023-06-16 LAB — HEPATIC FUNCTION PANEL
ALT: 25 U/L (ref 0–44)
AST: 18 U/L (ref 15–41)
Albumin: 2.9 g/dL — ABNORMAL LOW (ref 3.5–5.0)
Alkaline Phosphatase: 87 U/L (ref 38–126)
Bilirubin, Direct: <0.1 mg/dL (ref 0.0–0.2)
Total Bilirubin: 0.3 mg/dL (ref 0.3–1.2)
Total Protein: 6.5 g/dL (ref 6.5–8.1)

## 2023-06-16 LAB — PHOSPHORUS: Phosphorus: 2 mg/dL — ABNORMAL LOW (ref 2.5–4.6)

## 2023-06-16 MED ORDER — POTASSIUM CHLORIDE CRYS ER 20 MEQ PO TBCR
40.0000 meq | EXTENDED_RELEASE_TABLET | Freq: Once | ORAL | Status: AC
  Administered 2023-06-16: 40 meq via ORAL
  Filled 2023-06-16: qty 2

## 2023-06-16 MED ORDER — PREDNISONE 20 MG PO TABS
40.0000 mg | ORAL_TABLET | Freq: Every day | ORAL | Status: AC
  Administered 2023-06-16 – 2023-06-20 (×5): 40 mg via ORAL
  Filled 2023-06-16 (×5): qty 2

## 2023-06-16 MED ORDER — ASPIRIN 81 MG PO TBEC
81.0000 mg | DELAYED_RELEASE_TABLET | Freq: Every day | ORAL | Status: DC
  Administered 2023-06-16 – 2023-06-22 (×7): 81 mg via ORAL
  Filled 2023-06-16 (×7): qty 1

## 2023-06-16 MED ORDER — FUROSEMIDE 10 MG/ML IJ SOLN
40.0000 mg | Freq: Once | INTRAMUSCULAR | Status: AC
  Administered 2023-06-16: 40 mg via INTRAVENOUS
  Filled 2023-06-16: qty 4

## 2023-06-16 MED ORDER — METOPROLOL TARTRATE 50 MG PO TABS
50.0000 mg | ORAL_TABLET | Freq: Two times a day (BID) | ORAL | Status: DC
  Administered 2023-06-16 – 2023-06-22 (×12): 50 mg via ORAL
  Filled 2023-06-16 (×12): qty 1

## 2023-06-16 MED ORDER — GUAIFENESIN ER 600 MG PO TB12
600.0000 mg | ORAL_TABLET | Freq: Two times a day (BID) | ORAL | Status: DC
  Administered 2023-06-16 – 2023-06-22 (×13): 600 mg via ORAL
  Filled 2023-06-16 (×13): qty 1

## 2023-06-16 MED ORDER — METOPROLOL TARTRATE 25 MG PO TABS
25.0000 mg | ORAL_TABLET | Freq: Once | ORAL | Status: AC
  Administered 2023-06-16: 25 mg via ORAL
  Filled 2023-06-16: qty 1

## 2023-06-16 MED ORDER — IPRATROPIUM-ALBUTEROL 0.5-2.5 (3) MG/3ML IN SOLN
3.0000 mL | Freq: Three times a day (TID) | RESPIRATORY_TRACT | Status: DC
  Administered 2023-06-16 – 2023-06-17 (×2): 3 mL via RESPIRATORY_TRACT
  Filled 2023-06-16 (×2): qty 3

## 2023-06-16 MED ORDER — DOXYCYCLINE HYCLATE 100 MG PO TABS
100.0000 mg | ORAL_TABLET | Freq: Two times a day (BID) | ORAL | Status: AC
  Administered 2023-06-16 – 2023-06-21 (×10): 100 mg via ORAL
  Filled 2023-06-16 (×10): qty 1

## 2023-06-16 MED ORDER — CLOPIDOGREL BISULFATE 75 MG PO TABS
75.0000 mg | ORAL_TABLET | Freq: Every day | ORAL | Status: DC
  Administered 2023-06-17 – 2023-06-22 (×6): 75 mg via ORAL
  Filled 2023-06-16 (×6): qty 1

## 2023-06-16 NOTE — Plan of Care (Signed)

## 2023-06-16 NOTE — Consult Note (Signed)
Pharmacy Antibiotic Note  Austin Patterson is a 67 y.o. 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 2 days ago he started to have a dry cough and wheezing. Concern for cellulitis/abscess on chest plus pneumonia. 8/26 port-a-cath removed. New onset clinical cellulitis on R foot dorsum from admission. 8/27 pt reports more SOB and lung sounds. Treated for a COPD exacerbation. Afebrile, WBC 22 >> 18.8. Pharmacy has been consulted for Vancomycin dosing.  Plan: Day 4 of antibiotics Vancomycin 750 mg IV Q12H. Goal AUC 400-550. >> end 8/28 Expected AUC: 467 SCr used: 0.8 Vd: 0.5  Patient is also on Azithromycin 500 mg IV Q24H >> end 8/28 Patient is also on Ceftriaxone 2 g Q24H >> end 8/28 Continue to monitor renal function and follow culture results 8/28 switched patient to doxycycline 100 mg BID to complete 10 days total therapy.   Height: 5' 5.98" (167.6 cm) Weight: 88.9 kg (195 lb 15.8 oz) IBW/kg (Calculated) : 63.76  Temp (24hrs), Avg:98 F (36.7 C), Min:97.5 F (36.4 C), Max:98.5 F (36.9 C)  Recent Labs  Lab 06/12/23 1020 06/12/23 1603 06/13/23 0452 06/14/23 0446 06/14/23 1619 06/16/23 0824  WBC 22.7*  --  22.5* 16.0* 14.6* 18.8*  CREATININE 0.69  --  0.65 0.70 0.61 0.55*  LATICACIDVEN 1.2 1.1  --   --   --   --     Estimated Creatinine Clearance: 93.5 mL/min (A) (by C-G formula based on SCr of 0.55 mg/dL (L)).    Allergies  Allergen Reactions   Amoxicillin Anaphylaxis   Tizanidine     Feet and ankle swell    Antimicrobials this admission: Cefepime 2 g x 1 8/24  Dose adjustments this admission: NA  Microbiology results: 8/27 MRSA: Negative  8/26 Wound Cx: NGTD 8/26 BCx: NGTD 8/24 BCx: NGTD   Thank you for allowing pharmacy to be a part of this patient's  care.  Effie Shy PGY1 Pharmacy Resident  06/16/2023 7:50 PM

## 2023-06-16 NOTE — Anesthesia Postprocedure Evaluation (Signed)
Anesthesia Post Note  Patient: Austin Patterson  Procedure(s) Performed: REMOVAL PORT-A-CATH (Chest)  Patient location during evaluation: PACU Anesthesia Type: General Level of consciousness: awake and alert Pain management: pain level controlled Vital Signs Assessment: post-procedure vital signs reviewed and stable Respiratory status: spontaneous breathing, nonlabored ventilation, respiratory function stable and patient connected to nasal cannula oxygen Cardiovascular status: blood pressure returned to baseline and stable Postop Assessment: no apparent nausea or vomiting Anesthetic complications: no   No notable events documented.   Last Vitals:  Vitals:   06/16/23 0735 06/16/23 1049  BP: (!) 150/74   Pulse: 82   Resp: 16   Temp: (!) 36.4 C   SpO2: 96% 92%    Last Pain:  Vitals:   06/16/23 0750  TempSrc:   PainSc: 0-No pain                 Lenard Simmer

## 2023-06-16 NOTE — Consult Note (Signed)
Cardiology Consultation:   Patient ID: Austin Patterson; [id number]; 1956/07/13   Admit date: 06/12/2023 Date of Consult: 06/16/2023  Primary Care Provider: Pcp, No Primary Cardiologist: Agbor-Etang Primary Electrophysiologist:  None   Patient Profile:   Austin Patterson is a 67 y.o. male with a hx of CAD with PCI to the RCA in 2009, chronic hypoxic respiratory failure on supplemental oxygen with COPD and pulmonary fibrosis, prior tobacco use of 40+ years, HTN, and HLD who is being seen today for the evaluation of possible new onset Afib and OAC use in the setting of acute on chronic anemia and metastatic lung cancer with invasion of portal vein system with high risk of bleeding at the request of Dr. Lucianne Muss.  History of Present Illness:   Austin Patterson was previously evaluated by [facility] for chest pain and underwent stress test that was abnormal. Subsequent LHC showed significant RCA disease s/p PCI. He established with Dr. Azucena Cecil in 07/2022 noting some dyspnea. In this setting, he underwent Lexiscan MPI in 07/2022 that showed no evidence of ischemia or infarction with a normal LVSF and was overall low risk. Echo in 08/2022 showed an EF of 60-65%, no RWMA, Gr1DD, normal RVSF and ventricular cavity size, no significant valvular abnormalities, and an estimated right atrial pressure of 3 mmHg. Echo from 03/2023 showed an EF of 55-60%, no RWMA, normal RVSF and ventricular cavity size, aortic valve sclerosis, and an estimated right atrial pressure of 3 mmHg.   He was admitted on 06/12/2023 with sepsis in the setting of port-a-cath infection and cellulitis with admission complicated by possible new onset Afib/flutter with RVR, acute on chronic hypoxic respiratory failure with COPD exacerbation pulmonary fibrosis, stage IV lung cancer, and acute on chronic anemia.  Initial EKG notable for sinus tachycardia with RBBB.  EKGs this admission have shown sinus tachycardia with RBBB repeat  EKG on 8/24 at 10: 38, showed atrial flutter versus SVT, 182 bpm, RBBB.  Telemetry shows sinus rhythm with sinus tachycardia with bundle branch block.  He has been managed with rate control with Lopressor. OAC has not been in place due to severe iron deficiency anemia with metastatic lung cancer invading portal vein system with high risk of bleeding. He has been continued on aspirin without Plavix. Cardiology formally consulted on 8/28 to provide recommendation of OAC. Currently, without symptoms of angina or cardiac decompensation. No palpitations. In sinus rhythm on tele.     Past Medical History:  Diagnosis Date   Abdominal pain 12/07/2022   Acute urinary retention 12/07/2022   Chest pain 01/24/2022   COPD (chronic obstructive pulmonary disease) (HCC)    GERD (gastroesophageal reflux disease)    Hemothorax on right 05/11/2023   Hyperlipidemia    Hypertension    Pleuritic pain 05/10/2023   Pneumothorax after biopsy 05/11/2023   Post procedure discomfort 05/13/2023   Pulmonary fibrosis (HCC) 11/2015    Past Surgical History:  Procedure Laterality Date   COLONOSCOPY     CORONARY STENT PLACEMENT     ESOPHAGOGASTRODUODENOSCOPY (EGD) WITH PROPOFOL N/A 09/23/2016   Procedure: ESOPHAGOGASTRODUODENOSCOPY (EGD) WITH PROPOFOL;  Surgeon: Wyline Mood, MD;  Location: [facility] ENDOSCOPY;  Service: Endoscopy;  Laterality: N/A;   IR IMAGING GUIDED PORT INSERTION  05/26/2023   KYPHOPLASTY N/A 03/14/2020   Procedure: T7 & T11 KYPHOPLASTY;  Surgeon: Kennedy Bucker, MD;  Location: [facility] ORS;  Service: Orthopedics;  Laterality: N/A;   PORT-A-CATH REMOVAL N/A 06/14/2023   Procedure: REMOVAL PORT-A-CATH;  Surgeon: Sung Amabile, DO;  Location:  [facility] ORS;  Service: General;  Laterality: N/A;   SHOULDER ACROMIOPLASTY       Home Meds: Prior to Admission medications   Medication Sig Start Date End Date Taking? Authorizing Provider  acetaminophen (TYLENOL) 500 MG tablet Take 1,000 mg by mouth every 6 (six) hours as  needed for moderate pain or headache.   Yes [provider]  albuterol (VENTOLIN HFA) 108 (90 Base) MCG/ACT inhaler Inhale 2 puffs into the lungs every 6 (six) hours as needed for wheezing or shortness of breath. 01/29/20  Yes Danford, Earl Lites, MD  amLODipine (NORVASC) 10 MG tablet Take 1 tablet (10 mg total) by mouth daily. 08/19/16  Yes Virl Axe, MD  aspirin EC 81 MG tablet Take 1 tablet (81 mg total) by mouth daily. 08/19/16  Yes Virl Axe, MD  atorvastatin (LIPITOR) 40 MG tablet Take 1 tablet (40 mg total) by mouth at bedtime. 08/19/16  Yes Virl Axe, MD  CALCIUM 600/VITAMIN D 600-10 MG-MCG TABS Take 1 tablet by mouth 2 (two) times daily. 11/20/21  Yes [provider]  clopidogrel (PLAVIX) 75 MG tablet Take 1 tablet (75 mg total) by mouth daily. 08/19/16  Yes Virl Axe, MD  dexamethasone (DECADRON) 4 MG tablet Take 2 tablets (8mg ) by mouth daily starting the day after carboplatin for 3 days. Take with food 05/28/23  Yes Michaelyn Barter, MD  Dupilumab (DUPIXENT) 300 MG/2ML SOPN Inject 300 mg into the skin every 14 (fourteen) days. 11/04/22  Yes Salena Saner, MD  EPINEPHrine 0.3 mg/0.3 mL IJ SOAJ injection Inject 0.3 mg into the muscle as needed for anaphylaxis.   Yes [provider]  fluticasone-salmeterol (ADVAIR DISKUS) 250-50 MCG/ACT AEPB Inhale 1 puff into the lungs in the morning and at bedtime. 03/04/22  Yes Salena Saner, MD  gabapentin (NEURONTIN) 300 MG capsule Take 1 capsule (300 mg total) by mouth 3 (three) times daily. 04/27/23  Yes Salena Saner, MD  guaiFENesin-dextromethorphan (ROBITUSSIN DM) 100-10 MG/5ML syrup Take 5 mLs by mouth every 4 (four) hours as needed for cough. 06/01/23  Yes Michaelyn Barter, MD  ipratropium-albuterol (DUONEB) 0.5-2.5 (3) MG/3ML SOLN INHALE 1 VIAL THROUGH NEBULIZER EVERY 6 HOURS 10/01/22  Yes Salena Saner, MD  lidocaine-prilocaine (EMLA) cream Apply to affected area once 05/28/23  Yes Michaelyn Barter, MD  magnesium oxide (MAG-OX) 400 (241.3 Mg) MG tablet Take 1 tablet (400 mg total) by mouth daily. 06/18/17  Yes Sudini, Wardell Heath, MD  metoprolol succinate (TOPROL-XL) 50 MG 24 hr tablet Take 1 tablet (50 mg total) by mouth daily. 08/19/16  Yes Virl Axe, MD  montelukast (SINGULAIR) 10 MG tablet Take 10 mg by mouth daily. 11/19/21  Yes [provider]  Multiple Vitamin (MULTIVITAMIN WITH MINERALS) TABS tablet Take 1 tablet by mouth daily. 09/24/16  Yes Enedina Finner, MD  ondansetron (ZOFRAN) 8 MG tablet Take 1 tablet (8 mg total) by mouth every 8 (eight) hours as needed for nausea or vomiting. Start on the third day after carboplatin. 05/28/23  Yes Michaelyn Barter, MD  oxyCODONE (OXY IR/ROXICODONE) 5 MG immediate release tablet Take 1 tablet (5 mg total) by mouth every 6 (six) hours as needed for severe pain. 06/08/23  Yes Alinda Dooms, NP  pantoprazole (PROTONIX) 40 MG tablet Take 1 tablet (40 mg total) by mouth 2 (two) times daily. 09/23/16  Yes Enedina Finner, MD  predniSONE (DELTASONE) 10 MG tablet Take 1 tablet (10 mg total) by mouth daily as needed. Take  as directed. 04/27/23  Yes Salena Saner, MD  prochlorperazine (COMPAZINE) 10 MG tablet Take 1 tablet (10 mg total) by mouth every 6 (six) hours as needed for nausea or vomiting. 05/28/23  Yes Michaelyn Barter, MD  senna-docusate (SENOKOT-S) 8.6-50 MG tablet Take 1 tablet by mouth 2 (two) times daily. 12/09/22  Yes Esaw Grandchild A, DO  chlorpheniramine-HYDROcodone (TUSSIONEX) 10-8 MG/5ML Take 5 mLs by mouth at bedtime as needed for cough. Patient not taking: Reported on 06/12/2023 04/05/23   Sunnie Nielsen, DO  KLOR-CON M20 20 MEQ tablet Take 20 mEq by mouth daily. Patient not taking: Reported on 06/12/2023 11/20/21   [provider]  Spacer/Aero-Holding Chambers (AEROCHAMBER MV) inhaler Use as instructed 10/05/19   Salena Saner, MD  tamsulosin (FLOMAX) 0.4 MG CAPS capsule Take 1 capsule (0.4 mg total) by mouth daily  after supper. Patient not taking: Reported on 06/12/2023 12/09/22   Pennie Banter, DO    Inpatient Medications: Scheduled Meds:  atorvastatin  40 mg Oral QHS   azithromycin  500 mg Oral Daily   enoxaparin (LOVENOX) injection  50 mg Subcutaneous Q24H   ferrous sulfate  325 mg Oral BID WC   gabapentin  300 mg Oral TID   ipratropium-albuterol  3 mL Nebulization Q6H   metoprolol tartrate  25 mg Oral BID   mometasone-formoterol  2 puff Inhalation BID   montelukast  10 mg Oral Daily   pantoprazole  40 mg Oral BID   senna-docusate  1 tablet Oral BID   tamsulosin  0.4 mg Oral QPC supper   Continuous Infusions:  cefTRIAXone (ROCEPHIN)  IV 2 g (06/15/23 1249)   vancomycin 750 mg (06/15/23 2318)   PRN Meds: acetaminophen, albuterol, chlorpheniramine-HYDROcodone, guaiFENesin-dextromethorphan, metoprolol tartrate, Muscle Rub, ondansetron, mouth rinse, oxyCODONE, prochlorperazine  Allergies:   Allergies  Allergen Reactions   Amoxicillin Anaphylaxis   Tizanidine     Feet and ankle swell     Social History:   Social History   Socioeconomic History   Marital status: Legally Separated    Spouse name: Not on file   Number of children: Not on file   Years of education: Not on file   Highest education level: Not on file  Occupational History   Not on file  Tobacco Use   Smoking status: Former    Current packs/day: 0.00    Average packs/day: 2.0 packs/day for 50.0 years (100.0 ttl pk-yrs)    Types: Cigarettes    Start date: 10/13/1965    Quit date: 10/14/2015    Years since quitting: 7.6   Smokeless tobacco: Former  Building services engineer status: Never Used  Substance and Sexual Activity   Alcohol use: Yes    Alcohol/week: 56.0 standard drinks of alcohol    Types: 56 Cans of beer per week    Comment: "I sit around and drink beer, that's all I got to do"   Drug use: No   Sexual activity: Not Currently  Other Topics Concern   Not on file  Social History Narrative   Not on  file   Social Determinants of Health   Financial Resource Strain: Not on file  Food Insecurity: No Food Insecurity (06/12/2023)   Hunger Vital Sign    Worried About Running Out of Food in the Last Year: Never true    Ran Out of Food in the Last Year: Never true  Transportation Needs: No Transportation Needs (06/12/2023)   PRAPARE - Transportation    Lack of  Transportation (Medical): No    Lack of Transportation (Non-Medical): No  Physical Activity: Not on file  Stress: Not on file  Social Connections: Not on file  Intimate Partner Violence: Not At Risk (06/12/2023)   Humiliation, Afraid, Rape, and Kick questionnaire    Fear of Current or Ex-Partner: No    Emotionally Abused: No    Physically Abused: No    Sexually Abused: No     Family History:   Family History  Problem Relation Age of Onset   Heart disease Mother     ROS:  Review of Systems  Constitutional:  Positive for malaise/fatigue. Negative for chills, diaphoresis, fever and weight loss.  HENT:  Negative for congestion.   Eyes:  Negative for discharge and redness.  Respiratory:  Positive for cough, sputum production and shortness of breath. Negative for hemoptysis and wheezing.   Cardiovascular:  Negative for chest pain, palpitations, orthopnea, claudication, leg swelling and PND.  Gastrointestinal:  Negative for abdominal pain, blood in stool, heartburn, melena, nausea and vomiting.  Musculoskeletal:  Negative for falls and myalgias.  Skin:  Negative for rash.  Neurological:  Positive for weakness. Negative for dizziness, tingling, tremors, sensory change, speech change, focal weakness and loss of consciousness.  Endo/Heme/Allergies:  Does not bruise/bleed easily.  Psychiatric/Behavioral:  Negative for substance abuse. The patient is not nervous/anxious.   All other systems reviewed and are negative.     Physical Exam/Data:   Vitals:   06/16/23 0123 06/16/23 0250 06/16/23 0729 06/16/23 0735  BP:  127/79  (!)  150/74  Pulse:  87  82  Resp:  18  16  Temp:  97.8 F (36.6 C)  (!) 97.5 F (36.4 C)  TempSrc:  Oral  Oral  SpO2: 91% 93% 93% 96%  Weight:      Height:        Intake/Output Summary (Last 24 hours) at 06/16/2023 0835 Last data filed at 06/16/2023 0500 Gross per 24 hour  Intake --  Output 1700 ml  Net -1700 ml   Filed Weights   06/12/23 1745  Weight: 88.9 kg   Body mass index is 31.65 kg/m.   Physical Exam: General: Well developed, well nourished, in no acute distress. Head: Normocephalic, atraumatic, sclera non-icteric, no xanthomas, nares without discharge.  Neck: Negative for carotid bruits. JVD not elevated. Lungs: Diminished and coarse breath sounds. Cough productive of green sputum. Breathing is unlabored. Heart: RRR with S1 S2. No murmurs, rubs, or gallops appreciated. Abdomen: Soft, non-tender, non-distended with normoactive bowel sounds. No hepatomegaly. No rebound/guarding. No obvious abdominal masses. Msk:  Strength and tone appear normal for age. Extremities: No clubbing or cyanosis. No edema. Distal pedal pulses are 2+ and equal bilaterally. Neuro: Alert and oriented X 3. No facial asymmetry. No focal deficit. Moves all extremities spontaneously. Psych:  Responds to questions appropriately with a normal affect.   EKG:  The EKG was personally reviewed and demonstrates: 06/12/2023 (09:59) - sinus tachycardia, 118 bpm, RBBB, baseline artifact. 06/12/2023 (10:03) - sinus tachycardia, 123 bpm, RBBB, baseline artifact. 06/12/2023 (10:38) - atrial flutter with 2-1 AV block versus SVT, 182 bpm, RBBB Telemetry:  Telemetry was personally reviewed and demonstrates: Sinus rhythm with sinus tachycardia with underlying BBB  Weights: Filed Weights   06/12/23 1745  Weight: 88.9 kg    Relevant CV Studies:  2D echo 04/04/2023: 1. Left ventricular ejection fraction, by estimation, is 55 to 60%. The  left ventricle has normal function. The left ventricle has no regional  wall  motion abnormalities. Left ventricular diastolic parameters are  indeterminate.   2. Right ventricular systolic function is normal. The right ventricular  size is normal. Tricuspid regurgitation signal is inadequate for assessing  PA pressure.   3. The mitral valve is normal in structure. No evidence of mitral valve  regurgitation. No evidence of mitral stenosis.   4. The aortic valve has an indeterminant number of cusps. Aortic valve  regurgitation is not visualized. Aortic valve sclerosis is present, with  no evidence of aortic valve stenosis.   5. The inferior vena cava is normal in size with greater than 50%  respiratory variability, suggesting right atrial pressure of 3 mmHg.  __________  2D echo 08/26/2022: 1. Left ventricular ejection fraction, by estimation, is 60 to 65%. The  left ventricle has normal function. The left ventricle has no regional  wall motion abnormalities. Left ventricular diastolic parameters are  consistent with Grade I diastolic  dysfunction (impaired relaxation).   2. Right ventricular systolic function is normal. The right ventricular  size is normal.   3. The mitral valve is normal in structure. No evidence of mitral valve  regurgitation.   4. The aortic valve was not well visualized. Aortic valve regurgitation  is not visualized.   5. Aortic dilatation noted. There is mild dilatation of the aortic root,  measuring 41 mm.   6. The inferior vena cava is normal in size with greater than 50%  respiratory variability, suggesting right atrial pressure of 3 mmHg.  __________  Eugenie Birks MPI 07/27/2022:   The study is normal. The study is low risk.   No ST deviation was noted.   LV perfusion is normal. There is no evidence of ischemia. There is no evidence of infarction.   Left ventricular function is normal. End diastolic cavity size is normal. End systolic cavity size is normal.   CT attenuation images shows moderate aortic and coronary calcifications.    Suboptimal study due to GI uptake.  Laboratory Data:  Chemistry Recent Labs  Lab 06/12/23 1020 06/13/23 0452 06/14/23 0446 06/14/23 1619  NA 133* 132* 136  --   K 3.3* 3.5 4.2  --   CL 95* 101 101  --   CO2 25 23 27   --   GLUCOSE 144* 126* 129*  --   BUN 10 13 11   --   CREATININE 0.69 0.65 0.70 0.61  CALCIUM 7.6* 6.9* 7.5*  --   GFRNONAA >60 >60 >60 >60  ANIONGAP 13 8 13   --     Recent Labs  Lab 06/12/23 1020  PROT 7.0  ALBUMIN 3.6  AST 20  ALT 18  ALKPHOS 106  BILITOT 0.7   Hematology Recent Labs  Lab 06/13/23 0452 06/13/23 1402 06/14/23 0446 06/14/23 1619  WBC 22.5*  --  16.0* 14.6*  RBC 2.67*  --  3.00* 3.06*  HGB 7.4* 7.8* 8.0* 8.2*  HCT 22.5* 25.1* 26.4* 26.7*  MCV 84.3  --  88.0 87.3  MCH 27.7  --  26.7 26.8  MCHC 32.9  --  30.3 30.7  RDW 18.4*  --  18.5* 18.6*  PLT 381  --  441* 438*   Cardiac EnzymesNo results for input(s): "TROPONINI" in the last 168 hours. No results for input(s): "TROPIPOC" in the last 168 hours.  BNPNo results for input(s): "BNP", "PROBNP" in the last 168 hours.  DDimer No results for input(s): "DDIMER" in the last 168 hours.  Radiology/Studies:  [place] RT LOWER EXTREM LTD SOFT TISSUE NON VASCULAR  Result Date: 06/15/2023 IMPRESSION: 1. Persistent but decreased heterogeneous hypoechoic area within the dorsal soft tissues of the right midfoot, corresponding to the area of erythema and swelling. Given previous history of right foot trauma, this may reflect a resolving subcutaneous hematoma. Abscess cannot be excluded, though sonographic appearance is somewhat atypical. Continued follow-up is recommended. Electronically Signed   By: Sharlet Salina M.D.   On: 06/15/2023 16:09   DG Foot Complete Right  Result Date: 06/15/2023 IMPRESSION: 1. Persistent but decreasing soft tissue swelling throughout the right foot, greatest within the dorsal midfoot. 2. No acute or destructive bony abnormalities. Electronically Signed   By: Sharlet Salina  M.D.   On: 06/15/2023 16:06   DG Chest Port 1 View  Result Date: 06/15/2023 IMPRESSION: 1. Stable right middle lobe mass consistent with known lung cancer. 2. Stable background emphysema.  No acute airspace disease. Electronically Signed   By: Sharlet Salina M.D.   On: 06/15/2023 16:05   DG Chest Port 1 View  Result Date: 06/12/2023 IMPRESSION: 1. No acute finding when compared to prior. 2. Emphysema and mass at the right lung base. Electronically Signed   By: Tiburcio Pea M.D.   On: 06/12/2023 10:46    Assessment and Plan:   1.  Possible atrial flutter versus SVT: -EKG from 10/24 at 10:38 notable for possible atrial flutter with 2 1 AV block versus SVT.  Subsequent EKGs have demonstrated sinus tachycardia.  Telemetry shows sinus rhythm with sinus tachycardia and no evidence of A-fib/flutter. -Escalate rate control with titration of Lopressor to 50 mg twice daily. -Even if this is atrial flutter, patient is not an anticoagulation candidate with acute on chronic anemia and in the setting of metastatic lung cancer invading his portal vein with high risk of bleeding. -Patient is not a watchman candidate with significant comorbidities.  2. CAD involving the native coronary arteries without angina: -No symptoms of angina -Recent echo with preserved LV systolic function and normal wall motion -No plans for inpatient ischemic evaluation given acute on chronic anemia and in the context of invasive lung cancer involving the portal vein  3. HTN: -Blood pressure reasonably controlled -Lopressor as outlined above  4. Acute on chronic hypoxic respiratory failure with COPD, pulmonary fibrosis and metastatic lung cancer: -Will give a one-time dose of IV Lasix 40 mg, otherwise management per primary service  5. Acute on chronic iron deficiency anemia: -Maintain hemoglobin greater than 8 -Per IM  6. HLD: -LDL 45 in 01/2022 -Lipitor 40 mg    For questions or updates, please contact [facility] Please consult [email or web address] for contact info under Cardiology/STEMI.   Signed, Eula Listen, PA-C [facility] Pager: [phone] 06/16/2023, 8:35 AM

## 2023-06-16 NOTE — Evaluation (Signed)
Physical Therapy Evaluation Patient Details Name: Austin Patterson MRN: [id number] DOB: 20-Sep-1956 Today's Date: 06/16/2023  History of Present Illness  Austin Patterson is a 67 y.o. male with medical history significant of stage IV lung cancer on chemotherapy, asthma/COPD, pulmonary fibrosis, chronic HFpEF, CAD with stenting in 2010, HTN, came with multiple complaints including worsening of right-sided chest Port-A-Cath insertion site, new onset fever, and worsening of cough and wheezing. Patient with negative xray of foot.   Clinical Impression  Patient received on bed side commode. Requesting assistance from NT to clean up. Patient is able to stand while holding to bed with shaky legs. He step pivoted to bed with single UE support on bed. Patient then able to stand from bed by pulling up on walker, requiring min A for safety/balance. He ambulated to door and back to bed on 4 liters O2 with saturations dropping to 86%. Patient visibly short of breath with this requiring cues for pursed lip breathing. He returned to supine with mod I and all needs met.                If plan is discharge home, recommend the following: A little help with walking and/or transfers;A little help with bathing/dressing/bathroom;Assistance with cooking/housework;Assist for transportation;Help with stairs or ramp for entrance   Can travel by private vehicle        Equipment Recommendations None recommended by PT  Recommendations for Other Services       Functional Status Assessment Patient has had a recent decline in their functional status and demonstrates the ability to make significant improvements in function in a reasonable and predictable amount of time.     Precautions / Restrictions Precautions Precautions: Fall Restrictions Weight Bearing Restrictions: No      Mobility  Bed Mobility Overal bed mobility: Modified Independent             General bed mobility comments: patient impulsive,  shaky with mobility    Transfers Overall transfer level: Needs assistance Equipment used: Rolling walker (2 wheels) Transfers: Sit to/from Stand, Bed to chair/wheelchair/BSC Sit to Stand: Contact guard assist   Step pivot transfers: Min assist       General transfer comment: patient is generally unsteady with mobility at this time. Impulsive, shaky, O2 sats dropping into 80%s with minimal activity on 4 liters.    Ambulation/Gait Ambulation/Gait assistance: Min assist Gait Distance (Feet): 25 Feet Assistive device: Rolling walker (2 wheels) Gait Pattern/deviations: Step-through pattern, Decreased step length - right, Decreased step length - left, Decreased stride length Gait velocity: impulsive     General Gait Details: patient is generally unsteady, shaky with mobility. Requires AD and assistance. With ambulation to door on 4 liters his O2 sats dropped to 86%. Recovered to 92% with seated rest.  Stairs            Wheelchair Mobility     Tilt Bed    Modified Rankin (Stroke Patients Only)       Balance Overall balance assessment: Needs assistance Sitting-balance support: Feet supported Sitting balance-Leahy Scale: Fair Sitting balance - Comments: shaky   Standing balance support: Bilateral upper extremity supported, During functional activity, Reliant on assistive device for balance Standing balance-Leahy Scale: Fair                               Pertinent Vitals/Pain Pain Assessment Pain Assessment: No/denies pain    Home Living Family/patient expects to be  discharged to:: Private residence Living Arrangements: Alone Available Help at Discharge: Family;Friend(s);Available PRN/intermittently Type of Home: House Home Access: Ramped entrance       Home Layout: Laundry or work area in basement;One level Home Equipment: Cane - single Librarian, academic (2 wheels);Rollator (4 wheels);Shower seat Additional Comments: states he was not using  AD prior to admission    Prior Function Prior Level of Function : Independent/Modified Independent;Driving             Mobility Comments: indep without AD with constant 02 2L- reports no falls ADLs Comments: Ind with self care and has someone come once every 1-2wks to assist with laundry and cleaning of home     Extremity/Trunk Assessment   Upper Extremity Assessment Upper Extremity Assessment: Overall WFL for tasks assessed    Lower Extremity Assessment Lower Extremity Assessment: Generalized weakness    Cervical / Trunk Assessment Cervical / Trunk Assessment: Normal  Communication   Communication Communication: No apparent difficulties Cueing Techniques: Verbal cues  Cognition Arousal: Alert Behavior During Therapy: Impulsive Overall Cognitive Status: Within Functional Limits for tasks assessed                                          General Comments      Exercises     Assessment/Plan    PT Assessment Patient needs continued PT services  PT Problem List Decreased strength;Decreased activity tolerance;Decreased balance;Decreased mobility;Decreased coordination;Decreased knowledge of use of DME;Decreased safety awareness;Cardiopulmonary status limiting activity       PT Treatment Interventions DME instruction;Gait training;Stair training;Functional mobility training;Therapeutic activities;Therapeutic exercise;Patient/family education;Balance training    PT Goals (Current goals can be found in the Care Plan section)  Acute Rehab PT Goals Patient Stated Goal: to return home, feel better PT Goal Formulation: With patient Time For Goal Achievement: 06/29/23 Potential to Achieve Goals: Fair    Frequency Min 1X/week     Co-evaluation               AM-PAC PT "6 Clicks" Mobility  Outcome Measure Help needed turning from your back to your side while in a flat bed without using bedrails?: A Little Help needed moving from lying on your  back to sitting on the side of a flat bed without using bedrails?: A Little Help needed moving to and from a bed to a chair (including a wheelchair)?: A Little Help needed standing up from a chair using your arms (e.g., wheelchair or bedside chair)?: A Little Help needed to walk in hospital room?: A Lot Help needed climbing 3-5 steps with a railing? : A Lot 6 Click Score: 16    End of Session Equipment Utilized During Treatment: Oxygen Activity Tolerance: Other (comment);Treatment limited secondary to medical complications (Comment) (limited by respiratory status) Patient left: in bed;with call bell/phone within reach;with bed alarm set Nurse Communication: Mobility status PT Visit Diagnosis: Muscle weakness (generalized) (M62.81);Difficulty in walking, not elsewhere classified (R26.2);Other abnormalities of gait and mobility (R26.89);Unsteadiness on feet (R26.81)    Time: 2951-8841 PT Time Calculation (min) (ACUTE ONLY): 23 min   Charges:   PT Evaluation $PT Eval Moderate Complexity: 1 Mod PT Treatments $Gait Training: 8-22 mins PT General Charges $$ ACUTE PT VISIT: 1 Visit         Katai Marsico, PT, GCS 06/16/23,11:00 AM

## 2023-06-16 NOTE — Progress Notes (Addendum)
Subjective:  CC: Austin Patterson is a 67 y.o. male  Hospital stay day 4, 2 Days Post-Op port removal  HPI: No complaints at site.  ROS:  General: Denies weight loss, weight gain, fatigue, fevers, chills, and night sweats. Heart: Denies chest pain, palpitations, racing heart, irregular heartbeat, leg pain or swelling, and decreased activity tolerance. Respiratory: Denies breathing difficulty, shortness of breath, wheezing, cough, and sputum. GI: Denies change in appetite, heartburn, nausea, vomiting, constipation, diarrhea, and blood in stool. GU: Denies difficulty urinating, pain with urinating, urgency, frequency, blood in urine.   Objective:   Temp:  [97.5 F (36.4 C)-98.4 F (36.9 C)] 97.5 F (36.4 C) (08/28 0735) Pulse Rate:  [82-117] 82 (08/28 0735) Resp:  [16-20] 16 (08/28 0735) BP: (119-150)/(53-79) 150/74 (08/28 0735) SpO2:  [90 %-96 %] 92 % (08/28 1049) FiO2 (%):  [36 %] 36 % (08/28 0123)     Height: 5' 5.98" (167.6 cm) Weight: 88.9 kg BMI (Calculated): 31.65   Intake/Output this shift:   Intake/Output Summary (Last 24 hours) at 06/16/2023 1556 Last data filed at 06/16/2023 1423 Gross per 24 hour  Intake 480 ml  Output 3100 ml  Net -2620 ml    Constitutional :  alert, cooperative, appears stated age, and no distress  Respiratory:  clear to auscultation bilaterally  Cardiovascular:  regular rate and rhythm  Gastrointestinal: soft, non-tender; bowel sounds normal; no masses,  no organomegaly.   Skin: Cool and moist. Port site clean, heathy granulation tissue.  Measuring 5cm x 3cm x 1.5cm deep  Psychiatric: Normal affect, non-agitated, not confused       LABS:     Latest Ref Rng & Units 06/16/2023    8:24 AM 06/14/2023    4:19 PM 06/14/2023    4:46 AM  CMP  Glucose 70 - 99 mg/dL 161   096   BUN 8 - 23 mg/dL 10   11   Creatinine 0.45 - 1.24 mg/dL 4.09  8.11  9.14   Sodium 135 - 145 mmol/L 133   136   Potassium 3.5 - 5.1 mmol/L 3.5   4.2   Chloride 98 - 111  mmol/L 98   101   CO2 22 - 32 mmol/L 28   27   Calcium 8.9 - 10.3 mg/dL 7.0   7.5   Total Protein 6.5 - 8.1 g/dL 6.5     Total Bilirubin 0.3 - 1.2 mg/dL 0.3     Alkaline Phos 38 - 126 U/L 87     AST 15 - 41 U/L 18     ALT 0 - 44 U/L 25         Latest Ref Rng & Units 06/16/2023    8:24 AM 06/14/2023    4:19 PM 06/14/2023    4:46 AM  CBC  WBC 4.0 - 10.5 K/uL 18.8  14.6  16.0   Hemoglobin 13.0 - 17.0 g/dL 8.0  8.2  8.0   Hematocrit 39.0 - 52.0 % 24.7  26.7  26.4   Platelets 150 - 400 K/uL 613  438  441     RADS: N/a Assessment:   S/p infected port removal.  Wound looking well today.  Daily wet to dry dressing changes while inhouse.  Pt uncomfortable doing dressing changes at home, so will order MWF dressing changes with home health.  Right foot pain and swelling likely hematoma.  Recommend warm compress daily until it resolves.  Pt states it continued to decrease in size.  F/u three weeks  in office, sooner if any issues.  labs/images/medications/previous chart entries reviewed personally and relevant changes/updates noted above.

## 2023-06-16 NOTE — TOC Progression Note (Signed)
Transition of Care [facility]) - Progression Note    Patient Details  Name: Austin Patterson MRN: [id number] Date of Birth: [birth year]  [facility]) CM/SW Contact  Margarito Liner, LCSW Phone Number: 06/16/2023, 3:05 PM  Clinical Narrative:   Patient last worked with [facility]. He would like for CSW to give referral to them first. They have accepted for PT, RN. They are aware of wet-to-dry dressing change needs. Patient does not have a PCP so Dr. Tonna Boehringer will cover home health orders.  Expected Discharge Plan: Home/Self Care Barriers to Discharge: Continued Medical Work up  Expected Discharge Plan and Services     Post Acute Care Choice: NA Living arrangements for the past 2 months: Single Family Home                                       Social Determinants of Health (SDOH) Interventions SDOH Screenings   Food Insecurity: No Food Insecurity (06/12/2023)  Housing: Low Risk  (06/12/2023)  Transportation Needs: No Transportation Needs (06/12/2023)  Utilities: Not At Risk (06/12/2023)  Tobacco Use: Medium Risk (06/12/2023)    Readmission Risk Interventions    06/14/2023   11:19 AM  Readmission Risk Prevention Plan  Transportation Screening Complete  Medication Review (RN Care Manager) Complete  PCP or Specialist appointment within 3-5 days of discharge Complete  SW Recovery Care/Counseling Consult Complete  Palliative Care Screening Not Applicable  Skilled Nursing Facility Not Applicable

## 2023-06-16 NOTE — Progress Notes (Signed)
[facility] Progress Note  Patient: Austin Patterson    ZOX:[id number]  DOA: 06/12/2023     Date of Service: the patient was seen and examined on 06/16/2023  Chief Complaint  Patient presents with   Shortness of Breath   Brief hospital course: Austin Patterson is a 67 y.o. 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 Dx stage IV lung cancer this summer, started chemotherapy 3 weeks ago. 2 days ago he started to have a dry cough and wheezing, when he increased his breathing medications. Last night he started to have fever and chills and decided to come to the hospital 08/24.  08/24: to ED from home. Fever 100.3, tachycardia was found in A-fib with RVR heart rate 170-190 and responded to IV bolus of Cardizem, nonhypotensive nonhypoxic.  X-ray shows no acute infiltrates but no mass on the right lung base.  Blood work showed WBC 22.7, hemoglobin 11.0, creatinine 0.6. Started on vanc, cefepime, azithromycin. Surgical consult - hold plavix, abx, consider removal in few days / sooner if needed 08/25: not meeting sepsis criteria this morning, though still significant leukocytosis 22.5. Plan port-a-cath removal tomorrow  08/26: port-a-cath removal mid-day. BCx NGx2d. WBC down to 16, Hgb better at 8.0. Pot-a-cath removed today. Clinical cellulitis still significant nd new area suspected cellulitis on R foot dorsum, will keep on IV abx through next 24-48 hours  08/27: pt reports more SOB today. Lung sounds coarse/wheezing. CXR on personal read about same as couple days ago, starting steroids, he is already on broad spectrum abx, nebs.    Consultants:  General surgery    Procedures: Removal port cath 06/14/23 w/ Dr Tonna Boehringer    Assessment and Plan: Principal Problem:   Sepsis [facility]) Active Problems:   Acute on chronic respiratory failure with hypoxia (HCC)    Asthma-COPD overlap syndrome   Stage IV adenocarcinoma of lung (HCC)     Sepsis d/t infection of port-a-cath, POA Question cellulitis dorsum R foot fever, leukocytosis, source infection likely right-sided chest abscess and underneath soft tissue infection associated with port cath.   Atypical pneumonia cannot be ruled out at this point either.   S/p Extended abx coverage with vancomycin, ceftriaxone and azithromycin Blood culture --> NGx2d MRSA screening --> negative port-a-cath removal on 08/26 Cellulitis is improving, de-escalate antibiotics, discontinued vancomycin, ceftriaxone and azithromycin 8/28 started doxycycline 100 mg p.o. twice daily to complete 10-day course of antibiotics    # New Onset A-fib with RVR - resolved Resolved, repeat EKG showed sinus rhythm CHADS2VASC = 4 Cardiology Dr. 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  Continue ASA Cardiology consulted, recommended high risk for bleeding, no need of DOAC at this time Started metoprolol 50 mg p.o. twice daily   Acute COPD exacerbation Acute hypoxic respiratory failure  Pulmonary Fibrosis  Supplemental O2 Antibiotics as above cover pneumonia  p.o. steroid --> dose w/ IV today given wheezing is worse and increased SOB Breathing treatment Incentive spirometry Send atypical pneumonia study --> legionella pending, mycoplasma pending Started prednisone 40 mg p.o. 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  Interventions:  Diet: Heart healthy diet DVT Prophylaxis: Subcutaneous Lovenox   Advance goals of care discussion: DNR  Family Communication: family was not present at bedside, at the time of interview.  The pt provided permission to discuss medical plan with the family. Opportunity was given to ask question and all questions were answered satisfactorily.   Disposition:  Pt is from Home, admitted with sepsis, Port-A-Cath infection cellulitis, respiratory failure,, still has respiratory failure, which precludes a safe discharge. Discharge to home, when medically stable.  May need few more days to stay in the hospital.  Subjective: No significant events overnight, patient still has shortness of breath and chest congestion with cough and phlegm production.  Chest wall tenderness is improving, denied any other complaints.  Physical Exam: General: NAD, lying comfortably Appear in no distress, affect appropriate Eyes: PERRLA ENT: Oral Mucosa Clear, moist  Neck: no JVD,  Cardiovascular: S1 and S2 Present, no Murmur,  Respiratory: Equal air entry bilaterally, bilateral wheezing and crackles audible. S/p Port-A-Cath removal, mild chest wall erythema and tenderness, dressing CDI Abdomen: Bowel Sound present, Soft and no tenderness,  Skin: no rashes Extremities: no Pedal edema, no calf tenderness Neurologic: without any new focal findings Gait not checked due to patient safety concerns  Vitals:   06/16/23 0729 06/16/23 0735 06/16/23 1049 06/16/23 1558  BP:  (!) 150/74  131/69  Pulse:  82  88  Resp:  16  20  Temp:  (!) 97.5 F (36.4 C)  98.5 F (36.9 C)  TempSrc:  Oral  Oral   SpO2: 93% 96% 92% 95%  Weight:      Height:        Intake/Output Summary (Last 24 hours) at 06/16/2023 1602 Last data filed at 06/16/2023 1423 Gross per 24 hour  Intake 480 ml  Output 3100 ml  Net -2620 ml   Filed Weights   06/12/23 1745  Weight: 88.9 kg    Data Reviewed: I have personally reviewed and interpreted daily labs, tele strips, imagings as discussed above. I reviewed all nursing notes, pharmacy notes, vitals, pertinent old records I have discussed plan of care as described above with RN and patient/family.  CBC: Recent Labs  Lab 06/12/23 1020 06/13/23 0452 06/13/23 1402 06/14/23 0446 06/14/23 1619 06/16/23 0824  WBC 22.7* 22.5*  --  16.0* 14.6* 18.8*  NEUTROABS 18.9*  --   --   --   --  16.6*  HGB 9.0* 7.4* 7.8* 8.0* 8.2* 8.0*  HCT 28.7* 22.5* 25.1* 26.4* 26.7* 24.7*  MCV 87.8 84.3  --  88.0 87.3 84.3  PLT 368 381  --  441* 438* 613*   Basic Metabolic Panel: Recent Labs  Lab 06/12/23 1020 06/13/23 0452 06/14/23 0446 06/14/23 1619 06/16/23 0824  NA 133* 132* 136  --  133*  K 3.3* 3.5 4.2  --  3.5  CL 95* 101 101  --  98  CO2 25 23 27   --  28  GLUCOSE 144* 126* 129*  --  152*  BUN 10 13 11   --  10  CREATININE 0.69 0.65 0.70 0.61 0.55*  CALCIUM 7.6* 6.9* 7.5*  --  7.0*  MG  --   --   --   --  1.5*  PHOS  --   --   --   --  2.0*    Studies: No results found.  Scheduled Meds:  aspirin EC  81 mg Oral Daily   atorvastatin  40 mg Oral QHS   doxycycline  100 mg Oral Q12H   enoxaparin (LOVENOX) injection  50 mg Subcutaneous Q24H   ferrous sulfate  325 mg Oral BID WC   gabapentin  300 mg Oral TID   guaiFENesin  600 mg Oral BID   ipratropium-albuterol  3 mL Nebulization TID   metoprolol tartrate  50 mg Oral BID   mometasone-formoterol  2 puff Inhalation BID   montelukast  10 mg Oral Daily   pantoprazole  40 mg Oral BID   predniSONE  40 mg Oral Q breakfast   senna-docusate  1 tablet Oral BID   tamsulosin  0.4 mg Oral QPC supper   Continuous  Infusions: PRN Meds: acetaminophen, albuterol, chlorpheniramine-HYDROcodone, guaiFENesin-dextromethorphan, metoprolol tartrate, Muscle Rub, ondansetron, mouth rinse, oxyCODONE, prochlorperazine  Time spent: 55 minutes  Author: Gillis Santa. MD [facility] 06/16/2023 4:02 PM  To reach On-call, see care teams to locate the attending and reach out to them via [email or web address]. If 7PM-7AM, please contact night-coverage If you still have difficulty reaching the attending provider, please page the [facility] (Director on Call) for [facility] on amion for assistance.

## 2023-06-17 DIAGNOSIS — A419 Sepsis, unspecified organism: Secondary | ICD-10-CM | POA: Diagnosis not present

## 2023-06-17 LAB — BASIC METABOLIC PANEL
Anion gap: 11 (ref 5–15)
BUN: 15 mg/dL (ref 8–23)
CO2: 29 mmol/L (ref 22–32)
Calcium: 7.8 mg/dL — ABNORMAL LOW (ref 8.9–10.3)
Chloride: 96 mmol/L — ABNORMAL LOW (ref 98–111)
Creatinine, Ser: 0.59 mg/dL — ABNORMAL LOW (ref 0.61–1.24)
GFR, Estimated: >60 mL/min (ref >60)
Glucose, Bld: 137 mg/dL — ABNORMAL HIGH (ref 70–99)
Potassium: 4.3 mmol/L (ref 3.5–5.1)
Sodium: 136 mmol/L (ref 135–145)

## 2023-06-17 LAB — CBC WITH DIFFERENTIAL/PLATELET
Abs Immature Granulocytes: 0.27 10*3/uL — ABNORMAL HIGH (ref 0.00–0.07)
Basophils Absolute: 0.1 10*3/uL (ref 0.0–0.1)
Basophils Relative: 0 %
Eosinophils Absolute: 0 10*3/uL (ref 0.0–0.5)
Eosinophils Relative: 0 %
HCT: 26.9 % — ABNORMAL LOW (ref 39.0–52.0)
Hemoglobin: 8.2 g/dL — ABNORMAL LOW (ref 13.0–17.0)
Immature Granulocytes: 2 %
Lymphocytes Relative: 5 %
Lymphs Abs: 1 10*3/uL (ref 0.7–4.0)
MCH: 26.4 pg (ref 26.0–34.0)
MCHC: 30.5 g/dL (ref 30.0–36.0)
MCV: 86.5 fL (ref 80.0–100.0)
Monocytes Absolute: 1.1 10*3/uL — ABNORMAL HIGH (ref 0.1–1.0)
Monocytes Relative: 6 %
Neutro Abs: 16.1 10*3/uL — ABNORMAL HIGH (ref 1.7–7.7)
Neutrophils Relative %: 87 %
Platelets: 684 10*3/uL — ABNORMAL HIGH (ref 150–400)
RBC: 3.11 MIL/uL — ABNORMAL LOW (ref 4.22–5.81)
RDW: 18.1 % — ABNORMAL HIGH (ref 11.5–15.5)
WBC: 18.5 10*3/uL — ABNORMAL HIGH (ref 4.0–10.5)
nRBC: 0 % (ref 0.0–0.2)

## 2023-06-17 LAB — PHOSPHORUS: Phosphorus: 2.5 mg/dL (ref 2.5–4.6)

## 2023-06-17 LAB — CULTURE, BLOOD (ROUTINE X 2)
Culture: NO GROWTH
Culture: NO GROWTH
Special Requests: ADEQUATE

## 2023-06-17 LAB — MAGNESIUM: Magnesium: 1.6 mg/dL — ABNORMAL LOW (ref 1.7–2.4)

## 2023-06-17 MED ORDER — FUROSEMIDE 10 MG/ML IJ SOLN
40.0000 mg | Freq: Two times a day (BID) | INTRAMUSCULAR | Status: AC
  Administered 2023-06-17 (×2): 40 mg via INTRAVENOUS
  Filled 2023-06-17 (×2): qty 4

## 2023-06-17 MED ORDER — IPRATROPIUM-ALBUTEROL 0.5-2.5 (3) MG/3ML IN SOLN
3.0000 mL | Freq: Two times a day (BID) | RESPIRATORY_TRACT | Status: DC
  Administered 2023-06-17 – 2023-06-22 (×10): 3 mL via RESPIRATORY_TRACT
  Filled 2023-06-17 (×10): qty 3

## 2023-06-17 MED ORDER — MAGNESIUM SULFATE 4 GM/100ML IV SOLN
4.0000 g | Freq: Once | INTRAVENOUS | Status: AC
  Administered 2023-06-17: 4 g via INTRAVENOUS
  Filled 2023-06-17: qty 100

## 2023-06-17 NOTE — Progress Notes (Signed)
PT Cancellation Note  Patient Details Name: DELVONTA ROOTES MRN: [id number] DOB: 05/21/1956   Cancelled Treatment:    Reason Eval/Treat Not Completed: Fatigue/lethargy limiting ability to participate Patient reports he is "worn out", declines PT this afternoon. Will re-attempt at later date/time.   Abdul Beirne 06/17/2023, 2:24 PM

## 2023-06-17 NOTE — Progress Notes (Signed)
[facility] Progress Note  Patient: Austin Patterson    VOZ:[id number]  DOA: 06/12/2023     Date of Service: the patient was seen and examined on 06/17/2023  Chief Complaint  Patient presents with   Shortness of Breath   Brief hospital course: SABA BOESE is a 67 y.o. 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 Dx stage IV lung cancer this summer, started chemotherapy 3 weeks ago. 2 days ago he started to have a dry cough and wheezing, when he increased his breathing medications. Last night he started to have fever and chills and decided to come to the hospital 08/24.  08/24: to ED from home. Fever 100.3, tachycardia was found in A-fib with RVR heart rate 170-190 and responded to IV bolus of Cardizem, nonhypotensive nonhypoxic.  X-ray shows no acute infiltrates but no mass on the right lung base.  Blood work showed WBC 22.7, hemoglobin 11.0, creatinine 0.6. Started on vanc, cefepime, azithromycin. Surgical consult - hold plavix, abx, consider removal in few days / sooner if needed 08/25: not meeting sepsis criteria this morning, though still significant leukocytosis 22.5. Plan port-a-cath removal tomorrow  08/26: port-a-cath removal mid-day. BCx NGx2d. WBC down to 16, Hgb better at 8.0. Pot-a-cath removed today. Clinical cellulitis still significant nd new area suspected cellulitis on R foot dorsum, will keep on IV abx through next 24-48 hours  08/27: pt reports more SOB today. Lung sounds coarse/wheezing. CXR on personal read about same as couple days ago, starting steroids, he is already on broad spectrum abx, nebs.    Consultants:  General surgery  Cardiology   Procedures: Removal port cath 06/14/23 w/ Dr Tonna Boehringer    Assessment and Plan: Principal Problem:   Sepsis [facility]) Active Problems:   Acute on chronic respiratory failure with  hypoxia (HCC)   Asthma-COPD overlap syndrome   Stage IV adenocarcinoma of lung (HCC)     Sepsis d/t infection of port-a-cath, POA Question cellulitis dorsum R foot fever, leukocytosis, source infection likely right-sided chest abscess and underneath soft tissue infection associated with port cath.   Atypical pneumonia cannot be ruled out at this point either.   S/p Extended abx coverage with vancomycin, ceftriaxone and azithromycin Blood culture --> NGx2d MRSA screening --> negative port-a-cath removal on 08/26 Cellulitis is improving, de-escalate antibiotics, discontinued vancomycin, ceftriaxone and azithromycin 8/28 started doxycycline 100 mg p.o. twice daily to complete 10-day course of antibiotics    # New Onset A-fib with RVR - resolved Resolved, repeat EKG showed sinus rhythm CHADS2VASC = 4 Cardiology Dr. 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  Continue ASA Cardiology consulted, recommended high risk for bleeding, no need of DOAC at this time Started metoprolol 50 mg p.o. twice daily  # Hypomagnesemia, mag repleted. # Hypophosphatemia, resolved Monitor electrolytes daily.  # Acute COPD exacerbation # Acute hypoxic respiratory failure  # Pulmonary Fibrosis  Supplemental O2 Antibiotics as above cover pneumonia  p.o. steroid --> dose w/ IV today given wheezing is worse and increased SOB Breathing treatment Incentive spirometry Send atypical pneumonia study --> legionella pending, mycoplasma pending Started prednisone 40 mg p.o. 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  Interventions:  Diet: Heart healthy diet DVT Prophylaxis: Subcutaneous Lovenox   Advance goals of care discussion: DNR  Family Communication: family was not present at bedside, at the time of interview.  The pt provided permission to discuss medical plan with the family. Opportunity was given to ask question and all questions were answered satisfactorily.   Disposition:  Pt is from Home, admitted with sepsis, Port-A-Cath infection cellulitis, respiratory failure, still has respiratory failure, which precludes a safe discharge. Discharge to home, when medically stable.  May need few more days to stay in the hospital.  Subjective: No significant events overnight, patient has shortness of breath with cough and congestion, denies any worsening, does not feel much improvement.  Denies any chest pain, right chest wall tenderness is improving.  Denies any palpitations.   Physical Exam: General: NAD, lying comfortably Appear in no distress, affect appropriate Eyes: PERRLA ENT: Oral Mucosa Clear, moist  Neck: no JVD,  Cardiovascular: S1 and S2 Present, no Murmur,  Respiratory: Equal air entry bilaterally, bilateral wheezing and crackles audible. S/p Port-A-Cath removal, mild chest wall erythema and tenderness, dressing CDI Abdomen: Bowel Sound present, Soft and no tenderness,  Skin: no rashes Extremities: no Pedal edema, no calf tenderness Neurologic: without any new focal findings Gait not checked due to patient  safety concerns  Vitals:   06/16/23 1558 06/16/23 1926 06/17/23 0127 06/17/23 0836  BP: 131/69 135/77 132/75 (!) 161/82  Pulse: 88 91 81 78  Resp: 20 18 16 20   Temp: 98.5 F (36.9 C) 98.1 F (36.7 C) (!) 97.5 F (36.4 C) 97.8 F (36.6 C)  TempSrc: Oral Oral Oral   SpO2: 95% 94% 97% 99%  Weight:      Height:        Intake/Output Summary (Last 24 hours) at 06/17/2023 1530 Last data filed at 06/17/2023 1401 Gross per 24 hour  Intake 1200 ml  Output 1850 ml  Net -650 ml   Filed Weights   06/12/23 1745  Weight: 88.9 kg    Data Reviewed: I have personally reviewed and interpreted daily labs, tele strips, imagings as discussed above. I reviewed all nursing notes, pharmacy notes, vitals, pertinent old records I have discussed plan of care as described above with RN and patient/family.  CBC: Recent Labs  Lab 06/12/23 1020 06/13/23 0452 06/13/23 1402 06/14/23 0446 06/14/23 1619 06/16/23 0824 06/17/23 0329  WBC 22.7* 22.5*  --  16.0* 14.6* 18.8* 18.5*  NEUTROABS 18.9*  --   --   --   --  16.6* 16.1*  HGB 9.0* 7.4* 7.8* 8.0* 8.2* 8.0* 8.2*  HCT 28.7* 22.5* 25.1* 26.4* 26.7* 24.7* 26.9*  MCV 87.8 84.3  --  88.0 87.3 84.3 86.5  PLT 368 381  --  441* 438* 613* 684*   Basic Metabolic Panel: Recent Labs  Lab 06/12/23 1020 06/13/23 0452 06/14/23 0446  06/14/23 1619 06/16/23 0824 06/17/23 0329  NA 133* 132* 136  --  133* 136  K 3.3* 3.5 4.2  --  3.5 4.3  CL 95* 101 101  --  98 96*  CO2 25 23 27   --  28 29  GLUCOSE 144* 126* 129*  --  152* 137*  BUN 10 13 11   --  10 15  CREATININE 0.69 0.65 0.70 0.61 0.55* 0.59*  CALCIUM 7.6* 6.9* 7.5*  --  7.0* 7.8*  MG  --   --   --   --  1.5* 1.6*  PHOS  --   --   --   --  2.0* 2.5    Studies: No results found.  Scheduled Meds:  aspirin EC  81 mg Oral Daily   atorvastatin  40 mg Oral QHS   clopidogrel  75 mg Oral Daily   doxycycline  100 mg Oral Q12H   enoxaparin (LOVENOX) injection  50 mg Subcutaneous Q24H   ferrous  sulfate  325 mg Oral BID WC   furosemide  40 mg Intravenous BID   gabapentin  300 mg Oral TID   guaiFENesin  600 mg Oral BID   ipratropium-albuterol  3 mL Nebulization BID   metoprolol tartrate  50 mg Oral BID   mometasone-formoterol  2 puff Inhalation BID   montelukast  10 mg Oral Daily   pantoprazole  40 mg Oral BID   predniSONE  40 mg Oral Q breakfast   senna-docusate  1 tablet Oral BID   tamsulosin  0.4 mg Oral QPC supper   Continuous Infusions: PRN Meds: acetaminophen, albuterol, chlorpheniramine-HYDROcodone, guaiFENesin-dextromethorphan, metoprolol tartrate, Muscle Rub, ondansetron, mouth rinse, oxyCODONE, prochlorperazine  Time spent: 55 minutes  Author: Gillis Santa. MD [facility] 06/17/2023 3:30 PM  To reach On-call, see care teams to locate the attending and reach out to them via [email or web address]. If 7PM-7AM, please contact night-coverage If you still have difficulty reaching the attending provider, please page the [facility] (Director on Call) for [facility] on amion for assistance.

## 2023-06-17 NOTE — Plan of Care (Signed)

## 2023-06-17 NOTE — Progress Notes (Signed)
Mobility Specialist - Progress Note  Pre-mobility: HR 75, SpO2 91% During mobility: SpO2 86% Post-mobility: HR 79, SPO2 90%   06/17/23 1038  Mobility  Activity Stood at bedside;Ambulated with assistance in room;Ambulated with assistance in hallway  Level of Assistance Standby assist, set-up cues, supervision of patient - no hands on  Assistive Device Front wheel walker  Distance Ambulated (ft) 80 ft  Activity Response Tolerated well  $Mobility charge 1 Mobility  Mobility Specialist Start Time (ACUTE ONLY) 0955  Mobility Specialist Stop Time (ACUTE ONLY) 1031  Mobility Specialist Time Calculation (min) (ACUTE ONLY) 36 min   Pt supine upon entry, utilizing 4L. Pt completed bed mob, STS to RW indep and amb 80 ft in the hallway MinG-SBA. Pt destat ~40 ft into amb, unable to recover after one min pursed lip breathing ---recovering to 90% once placed on 6L. Pt returned to the room, left seated EOB, O2 90%. RN enter upon MS exit.  Zetta Bills Mobility Specialist 06/17/23 10:52 AM

## 2023-06-18 ENCOUNTER — Inpatient Hospital Stay: Payer: Medicare HMO

## 2023-06-18 DIAGNOSIS — A419 Sepsis, unspecified organism: Secondary | ICD-10-CM | POA: Diagnosis not present

## 2023-06-18 LAB — CBC WITH DIFFERENTIAL/PLATELET
Abs Immature Granulocytes: 0.12 10*3/uL — ABNORMAL HIGH (ref 0.00–0.07)
Basophils Absolute: 0 10*3/uL (ref 0.0–0.1)
Basophils Relative: 0 %
Eosinophils Absolute: 0 10*3/uL (ref 0.0–0.5)
Eosinophils Relative: 0 %
HCT: 28.3 % — ABNORMAL LOW (ref 39.0–52.0)
Hemoglobin: 8.7 g/dL — ABNORMAL LOW (ref 13.0–17.0)
Immature Granulocytes: 1 %
Lymphocytes Relative: 12 %
Lymphs Abs: 1.5 10*3/uL (ref 0.7–4.0)
MCH: 26.8 pg (ref 26.0–34.0)
MCHC: 30.7 g/dL (ref 30.0–36.0)
MCV: 87.1 fL (ref 80.0–100.0)
Monocytes Absolute: 1.1 10*3/uL — ABNORMAL HIGH (ref 0.1–1.0)
Monocytes Relative: 9 %
Neutro Abs: 9.2 10*3/uL — ABNORMAL HIGH (ref 1.7–7.7)
Neutrophils Relative %: 78 %
Platelets: 839 10*3/uL — ABNORMAL HIGH (ref 150–400)
RBC: 3.25 MIL/uL — ABNORMAL LOW (ref 4.22–5.81)
RDW: 17.9 % — ABNORMAL HIGH (ref 11.5–15.5)
WBC: 11.9 10*3/uL — ABNORMAL HIGH (ref 4.0–10.5)
nRBC: 0 % (ref 0.0–0.2)

## 2023-06-18 LAB — BASIC METABOLIC PANEL
Anion gap: 14 (ref 5–15)
BUN: 17 mg/dL (ref 8–23)
CO2: 31 mmol/L (ref 22–32)
Calcium: 8.7 mg/dL — ABNORMAL LOW (ref 8.9–10.3)
Chloride: 93 mmol/L — ABNORMAL LOW (ref 98–111)
Creatinine, Ser: 0.52 mg/dL — ABNORMAL LOW (ref 0.61–1.24)
GFR, Estimated: >60 mL/min (ref >60)
Glucose, Bld: 112 mg/dL — ABNORMAL HIGH (ref 70–99)
Potassium: 4.1 mmol/L (ref 3.5–5.1)
Sodium: 138 mmol/L (ref 135–145)

## 2023-06-18 LAB — MAGNESIUM: Magnesium: 2 mg/dL (ref 1.7–2.4)

## 2023-06-18 LAB — PHOSPHORUS: Phosphorus: 2.3 mg/dL — ABNORMAL LOW (ref 2.5–4.6)

## 2023-06-18 MED ORDER — FUROSEMIDE 10 MG/ML IJ SOLN
40.0000 mg | Freq: Two times a day (BID) | INTRAMUSCULAR | Status: AC
  Administered 2023-06-18 (×2): 40 mg via INTRAVENOUS
  Filled 2023-06-18 (×2): qty 4

## 2023-06-18 MED ORDER — K PHOS MONO-SOD PHOS DI & MONO 155-852-130 MG PO TABS
500.0000 mg | ORAL_TABLET | Freq: Four times a day (QID) | ORAL | Status: AC
  Administered 2023-06-18 (×2): 500 mg via ORAL
  Filled 2023-06-18 (×2): qty 2

## 2023-06-18 MED ORDER — AMLODIPINE BESYLATE 5 MG PO TABS
5.0000 mg | ORAL_TABLET | Freq: Every day | ORAL | Status: DC
  Administered 2023-06-18 – 2023-06-22 (×5): 5 mg via ORAL
  Filled 2023-06-18 (×5): qty 1

## 2023-06-18 NOTE — Progress Notes (Signed)
[facility] Progress Note  Patient: Austin Patterson    IEP:[id number]  DOA: 06/12/2023     Date of Service: the patient was seen and examined on 06/18/2023  Chief Complaint  Patient presents with   Shortness of Breath   Brief hospital course: KAINE GILLYARD is a 67 y.o. 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 Dx stage IV lung cancer this summer, started chemotherapy 3 weeks ago. 2 days ago he started to have a dry cough and wheezing, when he increased his breathing medications. Last night he started to have fever and chills and decided to come to the hospital 08/24.  08/24: to ED from home. Fever 100.3, tachycardia was found in A-fib with RVR heart rate 170-190 and responded to IV bolus of Cardizem, nonhypotensive nonhypoxic.  X-ray shows no acute infiltrates but no mass on the right lung base.  Blood work showed WBC 22.7, hemoglobin 11.0, creatinine 0.6. Started on vanc, cefepime, azithromycin. Surgical consult - hold plavix, abx, consider removal in few days / sooner if needed 08/25: not meeting sepsis criteria this morning, though still significant leukocytosis 22.5. Plan port-a-cath removal tomorrow  08/26: port-a-cath removal mid-day. BCx NGx2d. WBC down to 16, Hgb better at 8.0. Pot-a-cath removed today. Clinical cellulitis still significant nd new area suspected cellulitis on R foot dorsum, will keep on IV abx through next 24-48 hours  08/27: pt reports more SOB today. Lung sounds coarse/wheezing. CXR on personal read about same as couple days ago, starting steroids, he is already on broad spectrum abx, nebs.    Consultants:  General surgery  Cardiology   Procedures: Removal port cath 06/14/23 w/ Dr Tonna Boehringer    Assessment and Plan: Principal Problem:   Sepsis [facility]) Active Problems:   Acute on chronic respiratory failure with  hypoxia (HCC)   Asthma-COPD overlap syndrome   Stage IV adenocarcinoma of lung (HCC)     Sepsis d/t infection of port-a-cath, POA fever, leukocytosis, source infection likely right-sided chest abscess and underneath soft tissue infection associated with port cath.   Atypical pneumonia cannot be ruled out at this point either.   S/p Extended abx coverage with vancomycin, ceftriaxone and azithromycin Blood culture --> NGx2d MRSA screening --> negative port-a-cath removal on 08/26 Cellulitis is improving, de-escalate antibiotics, discontinued vancomycin, ceftriaxone and azithromycin 8/28 started doxycycline 100 mg p.o. twice daily to complete 10-day course of antibiotics    # New Onset A-fib with RVR - resolved Resolved, repeat EKG showed sinus rhythm CHADS2VASC = 4 Cardiology Dr. 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  Continue ASA Cardiology consulted, recommended high risk for bleeding, no need of DOAC at this time Started metoprolol 50 mg p.o. twice daily  # Hypomagnesemia, mag repleted. # Hypophosphatemia, Phos repleted. Monitor electrolytes daily.  # Acute COPD exacerbation # Acute hypoxic respiratory failure  # Pulmonary Fibrosis  Supplemental O2 Antibiotics as above cover pneumonia  p.o. steroid --> dose w/ IV today given wheezing is worse and increased SOB Breathing treatment Incentive spirometry Send atypical pneumonia study --> legionella pending, mycoplasma pending Started prednisone 40 mg p.o. 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  Interventions:  Diet: Heart healthy diet DVT Prophylaxis: Subcutaneous Lovenox   Advance goals of care discussion: DNR  Family Communication: family was not present at bedside, at the time of interview.  The pt provided permission to discuss medical plan with the family. Opportunity was given to ask question and all questions were answered satisfactorily.   Disposition:  Pt is from Home, admitted with sepsis, Port-A-Cath infection cellulitis, respiratory failure, still has respiratory failure, which precludes a safe discharge. Discharge to home, when medically stable.  May need few more days to stay in the hospital.  Subjective: No significant events overnight, patient still has shortness of breath on forted oxygen by nasal cannula, denies any worsening of shortness of breath but does not feel much improvement.  Patient is making urine after Lasix, renal function stable, patient agreed for diuresis again today. Right-sided chest wall tenderness is getting better, no new complaints.   Physical Exam: General: NAD, lying comfortably Appear in no distress, affect appropriate Eyes: PERRLA ENT: Oral Mucosa Clear, moist  Neck: no JVD,  Cardiovascular: S1 and S2 Present, no Murmur,  Respiratory: Equal air entry bilaterally, bilateral crackles, no wheezing   S/p Port-A-Cath removal, mild chest wall erythema and tenderness, dressing CDI Abdomen: Bowel Sound present, Soft and no tenderness,  Skin: no rashes Extremities: no Pedal  edema, no calf tenderness Neurologic: without any new focal findings Gait not checked due to patient safety concerns  Vitals:   06/17/23 2054 06/18/23 0717 06/18/23 0818 06/18/23 1549  BP:   138/70 130/74  Pulse:   91 92  Resp:   19 18  Temp:   97.6 F (36.4 C) 98.2 F (36.8 C)  TempSrc:    Oral  SpO2: 92% 94% 94% 94%  Weight:      Height:        Intake/Output Summary (Last 24 hours) at 06/18/2023 1751 Last data filed at 06/18/2023 1750 Gross per 24 hour  Intake 120 ml  Output 2150 ml  Net -2030 ml   Filed Weights   06/12/23 1745  Weight: 88.9 kg    Data Reviewed: I have personally reviewed and interpreted daily labs, tele strips, imagings as discussed above. I reviewed all nursing notes, pharmacy notes, vitals, pertinent old records I have discussed plan of care as described above with RN and patient/family.  CBC: Recent Labs  Lab 06/12/23 1020 06/13/23 0452 06/14/23 0446 06/14/23 1619 06/16/23 0824 06/17/23 0329 06/18/23 0455  WBC 22.7*   < > 16.0* 14.6* 18.8* 18.5* 11.9*  NEUTROABS 18.9*  --   --   --  16.6* 16.1* 9.2*  HGB 9.0*   < > 8.0* 8.2* 8.0* 8.2* 8.7*  HCT 28.7*   < > 26.4* 26.7* 24.7* 26.9* 28.3*  MCV 87.8   < > 88.0 87.3 84.3 86.5 87.1  PLT 368   < >  441* 438* 613* 684* 839*   < > = values in this interval not displayed.   Basic Metabolic Panel: Recent Labs  Lab 06/13/23 0452 06/14/23 0446 06/14/23 1619 06/16/23 0824 06/17/23 0329 06/18/23 0455  NA 132* 136  --  133* 136 138  K 3.5 4.2  --  3.5 4.3 4.1  CL 101 101  --  98 96* 93*  CO2 23 27  --  28 29 31   GLUCOSE 126* 129*  --  152* 137* 112*  BUN 13 11  --  10 15 17   CREATININE 0.65 0.70 0.61 0.55* 0.59* 0.52*  CALCIUM 6.9* 7.5*  --  7.0* 7.8* 8.7*  MG  --   --   --  1.5* 1.6* 2.0  PHOS  --   --   --  2.0* 2.5 2.3*    Studies: DG Chest Port 1 View  Result Date: 06/18/2023 CLINICAL DATA:  Shortness of breath.  Sepsis. EXAM: PORTABLE CHEST 1 VIEW COMPARISON:  Chest radiograph  06/15/2023 FINDINGS: The cardiomediastinal silhouette is unchanged with normal heart size. A large right lower lung mass and background lung emphysema are again noted. No acute airspace consolidation, overt pulmonary edema, sizable pleural effusion, or pneumothorax is identified. IMPRESSION: Known right lung mass and emphysema. No evidence of acute airspace disease. Electronically Signed   By: Sebastian Ache M.D.   On: 06/18/2023 14:34    Scheduled Meds:  amLODipine  5 mg Oral Daily   aspirin EC  81 mg Oral Daily   atorvastatin  40 mg Oral QHS   clopidogrel  75 mg Oral Daily   doxycycline  100 mg Oral Q12H   enoxaparin (LOVENOX) injection  50 mg Subcutaneous Q24H   ferrous sulfate  325 mg Oral BID WC   gabapentin  300 mg Oral TID   guaiFENesin  600 mg Oral BID   ipratropium-albuterol  3 mL Nebulization BID   metoprolol tartrate  50 mg Oral BID   mometasone-formoterol  2 puff Inhalation BID   montelukast  10 mg Oral Daily   pantoprazole  40 mg Oral BID   predniSONE  40 mg Oral Q breakfast   senna-docusate  1 tablet Oral BID   tamsulosin  0.4 mg Oral QPC supper   Continuous Infusions: PRN Meds: acetaminophen, albuterol, chlorpheniramine-HYDROcodone, guaiFENesin-dextromethorphan, metoprolol tartrate, Muscle Rub, ondansetron, mouth rinse, oxyCODONE, prochlorperazine  Time spent: 40 minutes  Author: Gillis Santa. MD [facility] 06/18/2023 5:51 PM  To reach On-call, see care teams to locate the attending and reach out to them via [email or web address]. If 7PM-7AM, please contact night-coverage If you still have difficulty reaching the attending provider, please page the [facility] (Director on Call) for [facility] on amion for assistance.

## 2023-06-18 NOTE — Progress Notes (Signed)
CCMD called to say that the patient had a run of ST, heart rate around 140-150 at 10:34. MD notified

## 2023-06-18 NOTE — Care Management Important Message (Signed)
Important Message  Patient Details  Name: EPHRIAM SILER MRN: [id number] Date of Birth: 02-26-1956   Medicare Important Message Given:  Yes     [name] 06/18/2023, 10:14 AM

## 2023-06-18 NOTE — Progress Notes (Addendum)
Mobility Specialist - Progress Note  During mobility: HR (112), SpO2(88) on 4L pushed to 6L to recover above 90(93) Post-mobility: HR(101), SPO2(91) back on 4L after 4 minutes     06/18/23 1637  Mobility  Activity Ambulated with assistance in hallway  Level of Assistance Standby assist, set-up cues, supervision of patient - no hands on  Assistive Device Front wheel walker  Distance Ambulated (ft) 80 ft  Range of Motion/Exercises Active  Activity Response Tolerated well  Mobility Referral Yes  $Mobility charge 1 Mobility  Mobility Specialist Start Time (ACUTE ONLY) 1610  Mobility Specialist Stop Time (ACUTE ONLY) 1637  Mobility Specialist Time Calculation (min) (ACUTE ONLY) 27 min   Pt resting in recliner on 4L upon entry. Pt STS and ambulates to hallway 80 ft before taking seating rest break SBA with RW. Pt desat to 87%; pt pushed to 6L during seated rest break to recover above 90 (93) for 3 minutes. Pt returned to recliner and left with needs in reach.   Johnathan Hausen Mobility Specialist 06/18/23, 4:51 PM

## 2023-06-18 NOTE — Plan of Care (Signed)

## 2023-06-18 NOTE — Plan of Care (Signed)
  Problem: Clinical Measurements: Goal: Diagnostic test results will improve Outcome: Progressing   Problem: Nutrition: Goal: Adequate nutrition will be maintained Outcome: Progressing   Problem: Pain Managment: Goal: General experience of comfort will improve Outcome: Progressing   Problem: Skin Integrity: Goal: Risk for impaired skin integrity will decrease Outcome: Progressing   

## 2023-06-19 ENCOUNTER — Inpatient Hospital Stay: Payer: Medicare HMO

## 2023-06-19 DIAGNOSIS — A419 Sepsis, unspecified organism: Secondary | ICD-10-CM | POA: Diagnosis not present

## 2023-06-19 LAB — CBC WITH DIFFERENTIAL/PLATELET
Abs Immature Granulocytes: 0.17 10*3/uL — ABNORMAL HIGH (ref 0.00–0.07)
Basophils Absolute: 0 10*3/uL (ref 0.0–0.1)
Basophils Relative: 0 %
Eosinophils Absolute: 0 10*3/uL (ref 0.0–0.5)
Eosinophils Relative: 0 %
HCT: 27.8 % — ABNORMAL LOW (ref 39.0–52.0)
Hemoglobin: 8.6 g/dL — ABNORMAL LOW (ref 13.0–17.0)
Immature Granulocytes: 1 %
Lymphocytes Relative: 15 %
Lymphs Abs: 1.8 10*3/uL (ref 0.7–4.0)
MCH: 26.8 pg (ref 26.0–34.0)
MCHC: 30.9 g/dL (ref 30.0–36.0)
MCV: 86.6 fL (ref 80.0–100.0)
Monocytes Absolute: 1.1 10*3/uL — ABNORMAL HIGH (ref 0.1–1.0)
Monocytes Relative: 9 %
Neutro Abs: 9.1 10*3/uL — ABNORMAL HIGH (ref 1.7–7.7)
Neutrophils Relative %: 75 %
Platelets: 880 10*3/uL — ABNORMAL HIGH (ref 150–400)
RBC: 3.21 MIL/uL — ABNORMAL LOW (ref 4.22–5.81)
RDW: 17.8 % — ABNORMAL HIGH (ref 11.5–15.5)
WBC: 12.3 10*3/uL — ABNORMAL HIGH (ref 4.0–10.5)
nRBC: 0.2 % (ref 0.0–0.2)

## 2023-06-19 LAB — BASIC METABOLIC PANEL
Anion gap: 9 (ref 5–15)
BUN: 19 mg/dL (ref 8–23)
CO2: 31 mmol/L (ref 22–32)
Calcium: 8.3 mg/dL — ABNORMAL LOW (ref 8.9–10.3)
Chloride: 95 mmol/L — ABNORMAL LOW (ref 98–111)
Creatinine, Ser: 0.54 mg/dL — ABNORMAL LOW (ref 0.61–1.24)
GFR, Estimated: >60 mL/min (ref >60)
Glucose, Bld: 100 mg/dL — ABNORMAL HIGH (ref 70–99)
Potassium: 3.8 mmol/L (ref 3.5–5.1)
Sodium: 135 mmol/L (ref 135–145)

## 2023-06-19 LAB — AEROBIC/ANAEROBIC CULTURE W GRAM STAIN (SURGICAL/DEEP WOUND)
Culture: NO GROWTH
Gram Stain: NONE SEEN

## 2023-06-19 LAB — PHOSPHORUS: Phosphorus: 3 mg/dL (ref 2.5–4.6)

## 2023-06-19 LAB — BRAIN NATRIURETIC PEPTIDE: B Natriuretic Peptide: 22.7 pg/mL (ref 0.0–100.0)

## 2023-06-19 LAB — MAGNESIUM: Magnesium: 1.9 mg/dL (ref 1.7–2.4)

## 2023-06-19 MED ORDER — FUROSEMIDE 10 MG/ML IJ SOLN
40.0000 mg | Freq: Two times a day (BID) | INTRAMUSCULAR | Status: DC
  Administered 2023-06-19 – 2023-06-21 (×4): 40 mg via INTRAVENOUS
  Filled 2023-06-19 (×4): qty 4

## 2023-06-19 NOTE — Progress Notes (Signed)
[facility] Progress Note  Patient: Austin Patterson    YNW:[id number]  DOA: 06/12/2023     Date of Service: the patient was seen and examined on 06/19/2023  Chief Complaint  Patient presents with   Shortness of Breath   Brief hospital course: Austin Patterson is a 67 y.o. 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 Dx stage IV lung cancer this summer, started chemotherapy 3 weeks ago. 2 days ago he started to have a dry cough and wheezing, when he increased his breathing medications. Last night he started to have fever and chills and decided to come to the hospital 08/24.  08/24: to ED from home. Fever 100.3, tachycardia was found in A-fib with RVR heart rate 170-190 and responded to IV bolus of Cardizem, nonhypotensive nonhypoxic.  X-ray shows no acute infiltrates but no mass on the right lung base.  Blood work showed WBC 22.7, hemoglobin 11.0, creatinine 0.6. Started on vanc, cefepime, azithromycin. Surgical consult - hold plavix, abx, consider removal in few days / sooner if needed 08/25: not meeting sepsis criteria this morning, though still significant leukocytosis 22.5. Plan port-a-cath removal tomorrow  08/26: port-a-cath removal mid-day. BCx NGx2d. WBC down to 16, Hgb better at 8.0. Pot-a-cath removed today. Clinical cellulitis still significant nd new area suspected cellulitis on R foot dorsum, will keep on IV abx through next 24-48 hours  08/27: pt reports more SOB today. Lung sounds coarse/wheezing. CXR on personal read about same as couple days ago, starting steroids, he is already on broad spectrum abx, nebs.    Consultants:  General surgery  Cardiology   Procedures: Removal port cath 06/14/23 w/ Dr Tonna Boehringer    Assessment and Plan: Principal Problem:   Sepsis [facility]) Active Problems:   Acute on chronic respiratory failure with  hypoxia (HCC)   Asthma-COPD overlap syndrome   Stage IV adenocarcinoma of lung (HCC)     Sepsis d/t infection of port-a-cath, POA fever, leukocytosis, source infection likely right-sided chest abscess and underneath soft tissue infection associated with port cath.   Atypical pneumonia cannot be ruled out at this point either.   S/p Extended abx coverage with vancomycin, ceftriaxone and azithromycin Blood culture --> NGx2d MRSA screening --> negative port-a-cath removal on 08/26 Cellulitis is improving, de-escalate antibiotics, discontinued vancomycin, ceftriaxone and azithromycin 8/28 started doxycycline 100 mg p.o. twice daily to complete 10-day course of antibiotics    # New Onset A-fib with RVR - resolved Resolved, repeat EKG showed sinus rhythm CHADS2VASC = 4 Cardiology Dr. 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  Continue ASA Cardiology consulted, recommended high risk for bleeding, no need of DOAC at this time Started metoprolol 50 mg p.o. twice daily  # Hypomagnesemia, mag repleted. # Hypophosphatemia, Phos repleted. Monitor electrolytes daily.  # Acute COPD exacerbation # Acute hypoxic respiratory failure  # Pulmonary Fibrosis  Supplemental O2 Antibiotics as above cover pneumonia  p.o. steroid --> dose w/ IV today given wheezing is worse and increased SOB Breathing treatment Incentive spirometry legionella negative, mycoplasma negative 8/28 Prednisone 40 mg p.o. 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  Interventions:  Diet: Heart healthy diet DVT Prophylaxis: Subcutaneous Lovenox   Advance goals of care discussion: DNR  Family Communication: family was not present at bedside, at the time of interview.  The pt provided permission to discuss medical plan with the family. Opportunity was given to ask question and all questions were answered satisfactorily.   Disposition:  Pt is from Home, admitted with sepsis, Port-A-Cath infection cellulitis, respiratory failure, still has respiratory failure, which precludes a safe discharge. Discharge to home, when medically stable.  May need few more days to stay in the hospital.  Subjective: No significant events overnight, patient still has shortness of breath on forted oxygen by nasal cannula, denies any worsening of shortness of breath but does not feel much improvement.  Patient is making urine after Lasix, renal function stable, patient agreed for diuresis again today. Right-sided chest wall tenderness is getting better, no new complaints.   Physical Exam: General: NAD, lying comfortably Appear in no distress, affect appropriate Eyes: PERRLA ENT: Oral Mucosa Clear, moist  Neck: no JVD,  Cardiovascular: S1 and S2 Present, no Murmur,  Respiratory: Equal air entry bilaterally, bilateral crackles, no wheezing   S/p Port-A-Cath removal, mild chest wall erythema and tenderness, dressing CDI Abdomen: Bowel Sound present, Soft and no tenderness,  Skin: no rashes Extremities:  no Pedal edema, no calf tenderness Neurologic: without any new focal findings Gait not checked due to patient safety concerns  Vitals:   06/18/23 2015 06/19/23 0523 06/19/23 0716 06/19/23 0743  BP: (!) 157/88 118/60  (!) 140/73  Pulse: 99 81  94  Resp: 20 20  18   Temp: 97.9 F (36.6 C) 97.8 F (36.6 C)  97.6 F (36.4 C)  TempSrc:  Oral  Oral  SpO2: 94% 96% 93% 94%  Weight:      Height:        Intake/Output Summary (Last 24 hours) at 06/19/2023 1402 Last data filed at 06/19/2023 1100 Gross per 24 hour  Intake 1920 ml  Output 1600 ml  Net 320 ml   Filed Weights   06/12/23 1745  Weight: 88.9 kg    Data Reviewed: I have personally reviewed and interpreted daily labs, tele strips, imagings as discussed above. I reviewed all nursing notes, pharmacy notes, vitals, pertinent old records I have discussed plan of care as described above with RN and patient/family.  CBC: Recent Labs  Lab 06/14/23 1619 06/16/23 0824 06/17/23 0329 06/18/23 0455 06/19/23 0443  WBC 14.6* 18.8* 18.5* 11.9* 12.3*  NEUTROABS  --  16.6* 16.1* 9.2* 9.1*  HGB 8.2* 8.0* 8.2* 8.7* 8.6*  HCT 26.7* 24.7* 26.9* 28.3* 27.8*  MCV 87.3 84.3 86.5 87.1 86.6  PLT 438* 613* 684* 839* 880*   Basic Metabolic Panel: Recent Labs  Lab 06/14/23 0446 06/14/23 1619 06/16/23 0824 06/17/23 0329 06/18/23  0455 06/19/23 0443  NA 136  --  133* 136 138 135  K 4.2  --  3.5 4.3 4.1 3.8  CL 101  --  98 96* 93* 95*  CO2 27  --  28 29 31 31   GLUCOSE 129*  --  152* 137* 112* 100*  BUN 11  --  10 15 17 19   CREATININE 0.70 0.61 0.55* 0.59* 0.52* 0.54*  CALCIUM 7.5*  --  7.0* 7.8* 8.7* 8.3*  MG  --   --  1.5* 1.6* 2.0 1.9  PHOS  --   --  2.0* 2.5 2.3* 3.0    Studies: No results found.  Scheduled Meds:  amLODipine  5 mg Oral Daily   aspirin EC  81 mg Oral Daily   atorvastatin  40 mg Oral QHS   clopidogrel  75 mg Oral Daily   doxycycline  100 mg Oral Q12H   enoxaparin (LOVENOX) injection  50 mg Subcutaneous Q24H    ferrous sulfate  325 mg Oral BID WC   gabapentin  300 mg Oral TID   guaiFENesin  600 mg Oral BID   ipratropium-albuterol  3 mL Nebulization BID   metoprolol tartrate  50 mg Oral BID   mometasone-formoterol  2 puff Inhalation BID   montelukast  10 mg Oral Daily   pantoprazole  40 mg Oral BID   predniSONE  40 mg Oral Q breakfast   senna-docusate  1 tablet Oral BID   tamsulosin  0.4 mg Oral QPC supper   Continuous Infusions: PRN Meds: acetaminophen, albuterol, chlorpheniramine-HYDROcodone, guaiFENesin-dextromethorphan, metoprolol tartrate, Muscle Rub, ondansetron, mouth rinse, oxyCODONE, prochlorperazine  Time spent: 40 minutes  Author: Gillis Santa. MD [facility] 06/19/2023 2:02 PM  To reach On-call, see care teams to locate the attending and reach out to them via [email or web address]. If 7PM-7AM, please contact night-coverage If you still have difficulty reaching the attending provider, please page the [facility] (Director on Call) for [facility] on amion for assistance.

## 2023-06-19 NOTE — Plan of Care (Signed)

## 2023-06-20 DIAGNOSIS — A419 Sepsis, unspecified organism: Secondary | ICD-10-CM | POA: Diagnosis not present

## 2023-06-20 LAB — PHOSPHORUS: Phosphorus: 4.2 mg/dL (ref 2.5–4.6)

## 2023-06-20 LAB — BASIC METABOLIC PANEL
Anion gap: 10 (ref 5–15)
BUN: 17 mg/dL (ref 8–23)
CO2: 32 mmol/L (ref 22–32)
Calcium: 8.7 mg/dL — ABNORMAL LOW (ref 8.9–10.3)
Chloride: 92 mmol/L — ABNORMAL LOW (ref 98–111)
Creatinine, Ser: 0.67 mg/dL (ref 0.61–1.24)
GFR, Estimated: >60 mL/min (ref >60)
Glucose, Bld: 104 mg/dL — ABNORMAL HIGH (ref 70–99)
Potassium: 4 mmol/L (ref 3.5–5.1)
Sodium: 134 mmol/L — ABNORMAL LOW (ref 135–145)

## 2023-06-20 LAB — CBC WITH DIFFERENTIAL/PLATELET
Abs Immature Granulocytes: 0.32 10*3/uL — ABNORMAL HIGH (ref 0.00–0.07)
Basophils Absolute: 0 10*3/uL (ref 0.0–0.1)
Basophils Relative: 0 %
Eosinophils Absolute: 0 10*3/uL (ref 0.0–0.5)
Eosinophils Relative: 0 %
HCT: 27.8 % — ABNORMAL LOW (ref 39.0–52.0)
Hemoglobin: 8.7 g/dL — ABNORMAL LOW (ref 13.0–17.0)
Immature Granulocytes: 3 %
Lymphocytes Relative: 16 %
Lymphs Abs: 2 10*3/uL (ref 0.7–4.0)
MCH: 26.6 pg (ref 26.0–34.0)
MCHC: 31.3 g/dL (ref 30.0–36.0)
MCV: 85 fL (ref 80.0–100.0)
Monocytes Absolute: 1.1 10*3/uL — ABNORMAL HIGH (ref 0.1–1.0)
Monocytes Relative: 9 %
Neutro Abs: 9.3 10*3/uL — ABNORMAL HIGH (ref 1.7–7.7)
Neutrophils Relative %: 72 %
Platelets: 906 10*3/uL (ref 150–400)
RBC: 3.27 MIL/uL — ABNORMAL LOW (ref 4.22–5.81)
RDW: 18.1 % — ABNORMAL HIGH (ref 11.5–15.5)
WBC: 12.8 10*3/uL — ABNORMAL HIGH (ref 4.0–10.5)
nRBC: 0 % (ref 0.0–0.2)

## 2023-06-20 LAB — MAGNESIUM: Magnesium: 1.7 mg/dL (ref 1.7–2.4)

## 2023-06-20 MED ORDER — OXYMETAZOLINE HCL 0.05 % NA SOLN
1.0000 | Freq: Two times a day (BID) | NASAL | Status: DC
  Administered 2023-06-20 – 2023-06-22 (×3): 1 via NASAL
  Filled 2023-06-20: qty 15

## 2023-06-20 MED ORDER — SALINE SPRAY 0.65 % NA SOLN
1.0000 | NASAL | Status: DC | PRN
  Filled 2023-06-20: qty 44

## 2023-06-20 NOTE — Progress Notes (Signed)
[facility] Progress Note  Patient: Austin Patterson    ZOX:[id number]  DOA: 06/12/2023     Date of Service: the patient was seen and examined on 06/20/2023  Chief Complaint  Patient presents with   Shortness of Breath   Brief hospital course: SHAHRAM FREW is a 67 y.o. 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 Dx stage IV lung cancer this summer, started chemotherapy 3 weeks ago. 2 days ago he started to have a dry cough and wheezing, when he increased his breathing medications. Last night he started to have fever and chills and decided to come to the hospital 08/24.  08/24: to ED from home. Fever 100.3, tachycardia was found in A-fib with RVR heart rate 170-190 and responded to IV bolus of Cardizem, nonhypotensive nonhypoxic.  X-ray shows no acute infiltrates but no mass on the right lung base.  Blood work showed WBC 22.7, hemoglobin 11.0, creatinine 0.6. Started on vanc, cefepime, azithromycin. Surgical consult - hold plavix, abx, consider removal in few days / sooner if needed 08/25: not meeting sepsis criteria this morning, though still significant leukocytosis 22.5. Plan port-a-cath removal tomorrow  08/26: port-a-cath removal mid-day. BCx NGx2d. WBC down to 16, Hgb better at 8.0. Pot-a-cath removed today. Clinical cellulitis still significant nd new area suspected cellulitis on R foot dorsum, will keep on IV abx through next 24-48 hours  08/27: pt reports more SOB today. Lung sounds coarse/wheezing. CXR on personal read about same as couple days ago, starting steroids, he is already on broad spectrum abx, nebs.    Consultants:  General surgery  Cardiology   Procedures: Removal port cath 06/14/23 w/ Dr Tonna Boehringer    Assessment and Plan: Principal Problem:   Sepsis [facility]) Active Problems:   Acute on chronic respiratory failure with  hypoxia (HCC)   Asthma-COPD overlap syndrome   Stage IV adenocarcinoma of lung (HCC)     Sepsis d/t infection of port-a-cath, POA fever, leukocytosis, source infection likely right-sided chest abscess and underneath soft tissue infection associated with port cath.   Atypical pneumonia cannot be ruled out at this point either.   S/p Extended abx coverage with vancomycin, ceftriaxone and azithromycin Blood culture --> NGx2d MRSA screening --> negative port-a-cath removal on 08/26 Cellulitis is improving, de-escalate antibiotics, discontinued vancomycin, ceftriaxone and azithromycin 8/28 started doxycycline 100 mg p.o. twice daily to complete 10-day course of antibiotics    # New Onset A-fib with RVR - resolved Resolved, repeat EKG showed sinus rhythm CHADS2VASC = 4 Cardiology Dr. 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  Continue ASA Cardiology consulted, recommended high risk for bleeding, no need of DOAC at this time Started metoprolol 50 mg p.o. twice daily  # Hypomagnesemia, mag repleted. # Hypophosphatemia, Phos repleted. Monitor electrolytes daily.  # Acute COPD exacerbation # Acute hypoxic respiratory failure  # Pulmonary Fibrosis  Supplemental O2 Antibiotics as above cover pneumonia  p.o. steroid --> dose w/ IV today given wheezing is worse and increased SOB Breathing treatment Incentive spirometry legionella negative, mycoplasma negative 8/28 Prednisone 40 mg p.o. daily for 5 days Lasix 40 mg x 1 dose given by cardiology on 8/28 8/29 Lasix 40 mg IV twice daily x 2 doses 8/30  Lasix 40 mg IV BID x 2 doses 8/31 Lasix 40 mg IV twice daily x 3 days 831 d/w pulmonologist, CT chest was repeated, 1. Interval decrease in size of the mass lesion in the anterior right lung base with decrease in  mediastinal and right hilar lymphadenopathy. 2. Interval increase in size of a 14 mm nodule in the posterior left costophrenic sulcus. 3. Stable right lower lobe pulmonary nodules. 4. Stable bilateral adrenal adenomas. 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  Interventions:  Diet: Heart healthy diet DVT Prophylaxis: Subcutaneous Lovenox   Advance goals of care discussion: DNR  Family Communication: family was not present at bedside, at the time of interview.  The pt provided permission to discuss medical plan with the family. Opportunity was given to ask question and all questions were answered satisfactorily.   Disposition:  Pt is from Home, admitted with sepsis, Port-A-Cath infection cellulitis, respiratory failure, still has respiratory failure, which precludes a safe discharge. Discharge to home, when medically stable.  May need few more days to stay in the hospital.  Subjective: No significant events overnight, patient does not feel any improvement in the shortness of breath, still requiring 4 L oxygen via nasal cannula.  Patient denied any other complaints.  No chest pain or palpitations.  No abdominal pain.  Physical  Exam: General: NAD, lying comfortably Appear in no distress, affect appropriate Eyes: PERRLA ENT: Oral Mucosa Clear, moist  Neck: no JVD,  Cardiovascular: S1 and S2 Present, no Murmur,  Respiratory: Equal air entry bilaterally, bilateral crackles, no wheezing   S/p Port-A-Cath removal, mild chest wall erythema and tenderness, dressing CDI Abdomen: Bowel Sound present, Soft and no tenderness,  Skin: no rashes Extremities: no Pedal edema, no calf tenderness Neurologic: without any new focal findings Gait not checked due to patient safety concerns  Vitals:   06/19/23 2014 06/20/23 0435 06/20/23 0709 06/20/23 0800  BP: 138/70 137/70  (!) 142/76  Pulse: 100 84  94  Resp: 18 18  18   Temp: 97.9 F (36.6 C) 97.8 F (36.6 C)  97.9 F (36.6 C)  TempSrc: Oral Oral  Oral  SpO2: 96% 97% 95% 94%  Weight:      Height:        Intake/Output Summary (Last 24 hours) at 06/20/2023 1341 Last data filed at 06/20/2023 1059 Gross per 24 hour  Intake 1800 ml  Output 2650 ml  Net -850 ml   Filed Weights   06/12/23 1745  Weight: 88.9 kg    Data Reviewed: I have personally reviewed and interpreted daily labs, tele strips, imagings as discussed above. I reviewed all nursing notes, pharmacy notes, vitals, pertinent old records I have discussed plan of care as described above with RN and patient/family.  CBC: Recent Labs  Lab 06/16/23 0824 06/17/23 0329 06/18/23 0455 06/19/23 0443 06/20/23 0456  WBC 18.8* 18.5* 11.9* 12.3* 12.8*  NEUTROABS 16.6* 16.1* 9.2* 9.1* 9.3*  HGB 8.0* 8.2* 8.7* 8.6* 8.7*  HCT 24.7* 26.9* 28.3* 27.8* 27.8*  MCV 84.3 86.5 87.1 86.6 85.0  PLT 613* 684* 839* 880* 906*   Basic Metabolic Panel: Recent Labs  Lab 06/16/23 0824 06/17/23 0329 06/18/23 0455 06/19/23 0443 06/20/23 0456  NA 133* 136 138 135 134*  K 3.5 4.3 4.1 3.8 4.0  CL 98 96* 93* 95* 92*  CO2 28 29 31 31  32  GLUCOSE 152* 137* 112* 100* 104*  BUN 10 15 17 19 17   CREATININE 0.55* 0.59* 0.52*  0.54* 0.67  CALCIUM 7.0* 7.8* 8.7* 8.3* 8.7*  MG 1.5* 1.6* 2.0 1.9 1.7  PHOS 2.0* 2.5 2.3* 3.0 4.2    Studies: CT CHEST WO CONTRAST  Result Date: 06/19/2023 CLINICAL DATA:  Interstitial lung disease. EXAM: CT CHEST WITHOUT CONTRAST TECHNIQUE: Multidetector CT imaging of the chest was performed following the standard protocol without IV contrast. RADIATION DOSE REDUCTION: This exam was performed according to the departmental dose-optimization program which includes automated exposure control, adjustment of the mA and/or kV according to patient size and/or use of iterative reconstruction technique. COMPARISON:  Chest x-ray 06/18/2023.  Chest CT 05/11/2023 FINDINGS: Cardiovascular: The heart size is normal. No substantial pericardial effusion. Coronary artery calcification is evident. Moderate atherosclerotic calcification is noted in the wall of the thoracic aorta. Mediastinum/Nodes: 13 mm short axis precarinal lymph node has decreased from 21 mm in the interval. 11 mm short axis right hilar lymph node has also decreased. No other mediastinal lymphadenopathy evident. The esophagus has normal imaging features. There is no axillary lymphadenopathy. Lungs/Pleura: Advanced changes of emphysema noted with a large bulla in the right mid lung and adjacent compressive atelectasis. The mass lesion identified previously in the anterior right lung base measures 7.1 x 5.9 cm today compared to 7.7 x 6.5 cm previously. There is some minimal tracking linear gas in the lateral aspect of the mass which may be related to necrosis although it has a branching configuration in some regions suggesting gas within incorporated bronchi. 12 mm right lower lobe nodule on 113/4 was partially obscured by atelectasis on the previous study but is stable comparing back to an exam from 05/10/2023. 18 mm right lower lobe nodule on 120/4 is similar. 14 mm nodule in the posterior left costophrenic sulcus (128/4) has increased from 7 mm on the  05/10/2023 exam. The dependent atelectasis in the right lung base previously has resolved in the interval. Upper Abdomen: Bilateral low-density adrenal nodules are stable with absolute attenuation suggesting adenomas. Musculoskeletal: No worrisome lytic or sclerotic osseous abnormality. Posttraumatic deformity noted right scapular spine. 2 level vertebral augmentation in the thoracic spine. IMPRESSION: 1. Interval decrease in size of the mass lesion in the anterior right lung base with decrease in mediastinal and right hilar lymphadenopathy. 2. Interval increase in size of a 14 mm nodule in the posterior left costophrenic sulcus. 3. Stable right lower lobe pulmonary nodules. 4. Stable bilateral adrenal adenomas. 5. Aortic Atherosclerosis (ICD10-I70.0) and Emphysema (ICD10-J43.9). Electronically Signed   By: Kennith Center M.D.   On: 06/19/2023 16:17    Scheduled Meds:  amLODipine  5 mg Oral Daily   aspirin EC  81 mg Oral Daily   atorvastatin  40 mg Oral QHS   clopidogrel  75 mg Oral Daily   doxycycline  100 mg Oral  Q12H   enoxaparin (LOVENOX) injection  50 mg Subcutaneous Q24H   ferrous sulfate  325 mg Oral BID WC   furosemide  40 mg Intravenous BID   gabapentin  300 mg Oral TID   guaiFENesin  600 mg Oral BID   ipratropium-albuterol  3 mL Nebulization BID   metoprolol tartrate  50 mg Oral BID   mometasone-formoterol  2 puff Inhalation BID   montelukast  10 mg Oral Daily   pantoprazole  40 mg Oral BID   senna-docusate  1 tablet Oral BID   tamsulosin  0.4 mg Oral QPC supper   Continuous Infusions: PRN Meds: acetaminophen, albuterol, chlorpheniramine-HYDROcodone, guaiFENesin-dextromethorphan, metoprolol tartrate, Muscle Rub, ondansetron, mouth rinse, oxyCODONE, prochlorperazine  Time spent: 40 minutes  Author: Gillis Santa. MD [facility] 06/20/2023 1:41 PM  To reach On-call, see care teams to locate the attending and reach out to them via [email or web address]. If 7PM-7AM, please contact  night-coverage If you still have difficulty reaching the attending provider, please page the [facility] (Director on Call) for [facility] on amion for assistance.

## 2023-06-20 NOTE — TOC Progression Note (Addendum)
Transition of Care [facility]) - Progression Note    Patient Details  Name: Austin Patterson MRN: [id number] Date of Birth: 05-Aug-1956  Transition of Care [facility] CM/SW Contact  Bing Quarry, RN Phone Number: 06/20/2023, 2:14 PM  Clinical Narrative: 06/20/23: Patient on chronic oxygen at 2L/[place] per prior CM assessment. Provider indicates will need to go home on 4L/[employer]. Notified [employer] and secure messaged the changes to be noted on Tuesday 06/22/23. Patient has home oxygen condenser to support that change and should have transport tanks available if it was ordered continuous originally when set up by [employer]. Updated provider. To obtain saturation test before placing order and expected discharge Monday 06/21/23.  Gabriel Cirri MSN RN CM  Transitions of Care Department [facility] [phone] Weekends Only       Expected Discharge Plan: Home w Home Health Services Barriers to Discharge: Continued Medical Work up  Expected Discharge Plan and Services     Post Acute Care Choice: NA Living arrangements for the past 2 months: Single Family Home                                       Social Determinants of Health (SDOH) Interventions SDOH Screenings   Food Insecurity: No Food Insecurity (06/12/2023)  Housing: Low Risk  (06/12/2023)  Transportation Needs: No Transportation Needs (06/12/2023)  Utilities: Not At Risk (06/12/2023)  Tobacco Use: Medium Risk (06/12/2023)    Readmission Risk Interventions    06/14/2023   11:19 AM  Readmission Risk Prevention Plan  Transportation Screening Complete  Medication Review (RN Care Manager) Complete  PCP or Specialist appointment within 3-5 days of discharge Complete  SW Recovery Care/Counseling Consult Complete  Palliative Care Screening Not Applicable  Skilled Nursing Facility Not Applicable

## 2023-06-20 NOTE — Progress Notes (Signed)
SATURATION QUALIFICATIONS: (This note is used to comply with regulatory documentation for home oxygen)  Patient Saturations on Room Air at Rest = 85%  Patient Saturations on Room Air while Ambulating = 82%  Patient Saturations on 4 Liters of oxygen while Ambulating = 91%  Please briefly explain why patient needs home oxygen:

## 2023-06-20 NOTE — Plan of Care (Signed)

## 2023-06-20 NOTE — Progress Notes (Signed)
Patient admit for sepsis due to infected port a cath, removed 06/14/23. 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 Critical lab this AM is Platelets 906, up from 880 yesterday. Looks like his platelets have been trending up since 06/16/23. Patient is on ASA 81mg  daily, Lovenox 50mg  daily, and plavix 75mg  daily. Vitals WNL. Paged Dr. Lindajo Royal at [phone], MD made aware, no new orders.

## 2023-06-20 NOTE — Progress Notes (Signed)
PT Cancellation Note  Patient Details Name: Austin Patterson MRN: [id number] DOB: 07-Dec-1955   Cancelled Treatment:    Reason Eval/Treat Not Completed: Patient not medically ready. Per chart review, pt with platelet value of 906 as of this morning at 0456. PT/OT contraindicated with platelet levels >900. Per RN note, pt has had up trending platelet values; MD has been notified. Will follow up with PT intervention when pt is medically appropriate.   Vira Blanco, PT, DPT 7:51 AM,06/20/23 Physical Therapist - [place] [facility]

## 2023-06-20 NOTE — Plan of Care (Signed)
  Problem: Health Behavior/Discharge Planning: Goal: Ability to manage health-related needs will improve Outcome: Progressing   Problem: Clinical Measurements: Goal: Diagnostic test results will improve Outcome: Progressing   Problem: Activity: Goal: Risk for activity intolerance will decrease Outcome: Progressing   Problem: Nutrition: Goal: Adequate nutrition will be maintained Outcome: Progressing   

## 2023-06-20 NOTE — Plan of Care (Signed)

## 2023-06-21 DIAGNOSIS — A419 Sepsis, unspecified organism: Secondary | ICD-10-CM | POA: Diagnosis not present

## 2023-06-21 LAB — CBC WITH DIFFERENTIAL/PLATELET
Abs Immature Granulocytes: 0.26 10*3/uL — ABNORMAL HIGH (ref 0.00–0.07)
Basophils Absolute: 0 10*3/uL (ref 0.0–0.1)
Basophils Relative: 0 %
Eosinophils Absolute: 0 10*3/uL (ref 0.0–0.5)
Eosinophils Relative: 0 %
HCT: 28.4 % — ABNORMAL LOW (ref 39.0–52.0)
Hemoglobin: 8.9 g/dL — ABNORMAL LOW (ref 13.0–17.0)
Immature Granulocytes: 2 %
Lymphocytes Relative: 14 %
Lymphs Abs: 2 10*3/uL (ref 0.7–4.0)
MCH: 26.6 pg (ref 26.0–34.0)
MCHC: 31.3 g/dL (ref 30.0–36.0)
MCV: 85 fL (ref 80.0–100.0)
Monocytes Absolute: 1.3 10*3/uL — ABNORMAL HIGH (ref 0.1–1.0)
Monocytes Relative: 9 %
Neutro Abs: 11 10*3/uL — ABNORMAL HIGH (ref 1.7–7.7)
Neutrophils Relative %: 75 %
Platelets: 862 10*3/uL — ABNORMAL HIGH (ref 150–400)
RBC: 3.34 MIL/uL — ABNORMAL LOW (ref 4.22–5.81)
RDW: 17.8 % — ABNORMAL HIGH (ref 11.5–15.5)
WBC: 14.6 10*3/uL — ABNORMAL HIGH (ref 4.0–10.5)
nRBC: 0 % (ref 0.0–0.2)

## 2023-06-21 LAB — BASIC METABOLIC PANEL
Anion gap: 10 (ref 5–15)
BUN: 19 mg/dL (ref 8–23)
CO2: 33 mmol/L — ABNORMAL HIGH (ref 22–32)
Calcium: 8.8 mg/dL — ABNORMAL LOW (ref 8.9–10.3)
Chloride: 89 mmol/L — ABNORMAL LOW (ref 98–111)
Creatinine, Ser: 0.65 mg/dL (ref 0.61–1.24)
GFR, Estimated: >60 mL/min (ref >60)
Glucose, Bld: 102 mg/dL — ABNORMAL HIGH (ref 70–99)
Potassium: 3.9 mmol/L (ref 3.5–5.1)
Sodium: 132 mmol/L — ABNORMAL LOW (ref 135–145)

## 2023-06-21 LAB — MAGNESIUM: Magnesium: 1.7 mg/dL (ref 1.7–2.4)

## 2023-06-21 LAB — PHOSPHORUS: Phosphorus: 3.8 mg/dL (ref 2.5–4.6)

## 2023-06-21 NOTE — Progress Notes (Signed)
Physical Therapy Treatment Patient Details Name: Austin Patterson MRN: [id number] DOB: November 29, 1955 Today's Date: 06/21/2023   History of Present Illness Austin Patterson is a 67 y.o. male with medical history significant of stage IV lung cancer on chemotherapy, asthma/COPD, pulmonary fibrosis, chronic HFpEF, CAD with stenting in 2010, HTN, came with multiple complaints including worsening of right-sided chest Port-A-Cath insertion site, new onset fever, and worsening of cough and wheezing. Patient with negative xray of foot.    PT Comments  Pt was sitting in recliner upon arrival. He is A and O x 4. Agrees to session and remains cooperative. Was safely able to stand and ambulate 2 laps in hallway with one occasion of desaturation below 88%. He recovered to > 88% with standing rest and breathing techniques. Overall pt is progressing. DC recs remain appropriate.    If plan is discharge home, recommend the following: A little help with walking and/or transfers;A little help with bathing/dressing/bathroom;Assistance with cooking/housework;Assist for transportation;Help with stairs or ramp for entrance     Equipment Recommendations  None recommended by PT       Precautions / Restrictions Precautions Precautions: Fall Restrictions Weight Bearing Restrictions: No     Mobility  Bed Mobility Overal bed mobility: Modified Independent  General bed mobility comments: pt easily perfromed sit to supine after ambulation 2 laps in hallway    Transfers Overall transfer level: Needs assistance Equipment used: Rolling walker (2 wheels) Transfers: Sit to/from Stand Sit to Stand: Supervision     Ambulation/Gait Ambulation/Gait assistance: Supervision Gait Distance (Feet): 400 Feet Assistive device: Rolling walker (2 wheels) Gait Pattern/deviations: Step-through pattern, Decreased step length - right, Decreased step length - left, Decreased stride length Gait velocity: WNL     General Gait  Details: pt ambulated 2 laps around RN desk with 4 L o2 donned. He did have 1 occasion of desaturation however with breathing techniques and standing rest, recovers to > 88%     Balance Overall balance assessment: Needs assistance Sitting-balance support: Feet supported Sitting balance-Leahy Scale: Good     Standing balance support: Bilateral upper extremity supported, During functional activity, Reliant on assistive device for balance Standing balance-Leahy Scale: Fair      Cognition Arousal: Alert Behavior During Therapy: WFL for tasks assessed/performed Overall Cognitive Status: Within Functional Limits for tasks assessed    General Comments: pt is A and pleasant.               Pertinent Vitals/Pain Pain Assessment Pain Assessment: No/denies pain     PT Goals (current goals can now be found in the care plan section) Acute Rehab PT Goals Patient Stated Goal: to return home, feel better Progress towards PT goals: Progressing toward goals    Frequency    Min 1X/week       AM-PAC PT "6 Clicks" Mobility   Outcome Measure  Help needed turning from your back to your side while in a flat bed without using bedrails?: A Little Help needed moving from lying on your back to sitting on the side of a flat bed without using bedrails?: A Little Help needed moving to and from a bed to a chair (including a wheelchair)?: A Little Help needed standing up from a chair using your arms (e.g., wheelchair or bedside chair)?: A Little Help needed to walk in hospital room?: A Little Help needed climbing 3-5 steps with a railing? : A Little 6 Click Score: 18    End of Session Equipment Utilized During Treatment: Oxygen (  4L throughout) Activity Tolerance: Patient tolerated treatment well Patient left: in bed;with call bell/phone within reach;with bed alarm set Nurse Communication: Mobility status PT Visit Diagnosis: Muscle weakness (generalized) (M62.81);Difficulty in walking, not  elsewhere classified (R26.2);Other abnormalities of gait and mobility (R26.89);Unsteadiness on feet (R26.81)     Time: 4270-6237 PT Time Calculation (min) (ACUTE ONLY): 14 min  Charges:    $Gait Training: 8-22 mins PT General Charges $$ ACUTE PT VISIT: 1 Visit                     Jetta Lout PTA 06/21/23, 3:31 PM

## 2023-06-21 NOTE — Care Management Important Message (Signed)
Important Message  Patient Details  Name: Austin Patterson MRN: [id number] Date of Birth: 1956-04-30   Medicare Important Message Given:  Yes     [name] 06/21/2023, 11:35 AM

## 2023-06-21 NOTE — Progress Notes (Signed)
[facility] Progress Note  Patient: Austin Patterson    NWG:[id number]  DOA: 06/12/2023     Date of Service: the patient was seen and examined on 06/21/2023  Chief Complaint  Patient presents with   Shortness of Breath   Brief hospital course: Austin Patterson is a 67 y.o. 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 Dx stage IV lung cancer this summer, started chemotherapy 3 weeks ago. 2 days ago he started to have a dry cough and wheezing, when he increased his breathing medications. Last night he started to have fever and chills and decided to come to the hospital 08/24.  08/24: to ED from home. Fever 100.3, tachycardia was found in A-fib with RVR heart rate 170-190 and responded to IV bolus of Cardizem, nonhypotensive nonhypoxic.  X-ray shows no acute infiltrates but no mass on the right lung base.  Blood work showed WBC 22.7, hemoglobin 11.0, creatinine 0.6. Started on vanc, cefepime, azithromycin. Surgical consult - hold plavix, abx, consider removal in few days / sooner if needed 08/25: not meeting sepsis criteria this morning, though still significant leukocytosis 22.5. Plan port-a-cath removal tomorrow  08/26: port-a-cath removal mid-day. BCx NGx2d. WBC down to 16, Hgb better at 8.0. Pot-a-cath removed today. Clinical cellulitis still significant nd new area suspected cellulitis on R foot dorsum, will keep on IV abx through next 24-48 hours  08/27: pt reports more SOB today. Lung sounds coarse/wheezing. CXR on personal read about same as couple days ago, starting steroids, he is already on broad spectrum abx, nebs.    Consultants:  General surgery  Cardiology   Procedures: Removal port cath 06/14/23 w/ Dr Tonna Boehringer    Assessment and Plan: Principal Problem:   Sepsis [facility]) Active Problems:   Acute on chronic respiratory failure with  hypoxia (HCC)   Asthma-COPD overlap syndrome   Stage IV adenocarcinoma of lung (HCC)     Sepsis d/t infection of port-a-cath, POA fever, leukocytosis, source infection likely right-sided chest abscess and underneath soft tissue infection associated with port cath.   Atypical pneumonia cannot be ruled out at this point either.   S/p Extended abx coverage with vancomycin, ceftriaxone and azithromycin Blood culture --> NGx2d MRSA screening --> negative port-a-cath removal on 08/26 Cellulitis is improving, de-escalate antibiotics, discontinued vancomycin, ceftriaxone and azithromycin 8/28 started doxycycline 100 mg p.o. BID, completed 10-day course of antibiotics on 06/20/23    # New Onset A-fib with RVR - resolved Resolved, repeat EKG showed sinus rhythm CHADS2VASC = 4 Cardiology Dr. 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  Continue ASA Cardiology consulted, recommended high risk for bleeding, no need of DOAC at this time Started metoprolol 50 mg p.o. twice daily  # Hypomagnesemia, mag repleted. # Hypophosphatemia, Phos repleted. Monitor electrolytes daily.  # Acute COPD exacerbation # Acute hypoxic respiratory failure  # Pulmonary Fibrosis  Supplemental O2 Antibiotics as above cover pneumonia  p.o. steroid --> dose w/ IV today given wheezing is worse and increased SOB Breathing treatment Incentive spirometry legionella negative, mycoplasma negative 8/28 Prednisone 40 mg p.o. daily for 5 days Lasix 40 mg x 1 dose given by cardiology on 8/28 8/29 Lasix 40 mg IV BID, d/c'd on 9/2, as per  patient, does not feel any difference in the breathing 8/31 d/w pulmonologist, CT chest was repeated, 1. Interval decrease in size of the mass lesion in the anterior right lung base with decrease in mediastinal and  right hilar lymphadenopathy. 2. Interval increase in size of a 14 mm nodule in the posterior left costophrenic sulcus. 3. Stable right lower lobe pulmonary nodules. 4. Stable bilateral adrenal adenomas. 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  Interventions:  Diet: Heart healthy diet DVT Prophylaxis: Subcutaneous Lovenox   Advance goals of care discussion: DNR  Family Communication: family was not present at bedside, at the time of interview.  The pt provided permission to discuss medical plan with the family. Opportunity was given to ask question and all questions were answered satisfactorily.   Disposition:  Pt is from Home, admitted with sepsis, Port-A-Cath infection cellulitis, respiratory failure, still has respiratory failure on 4 L oxygen, it seems patient's baseline will be for oxygen going forward.  Stable to discharge.   Discharge to home tomorrow a.m.  Subjective: No significant events overnight, patient is making enough urine after Lasix, does not feel any improvement in the breathing, still on faltered oxygen via nasal cannula.  Patient is requesting to stop Lasix today.  Patient would like to stay  another day, and discharge plan tomorrow a.m. on 4 L oxygen if no improvement.  Physical Exam: General: NAD, lying comfortably Appear in no distress, affect appropriate Eyes: PERRLA ENT: Oral Mucosa Clear, moist  Neck: no JVD,  Cardiovascular: S1 and S2 Present, no Murmur,  Respiratory: Equal air entry bilaterally, bilateral crackles, no wheezing   S/p Port-A-Cath removal, mild chest wall erythema and tenderness, dressing CDI Abdomen: Bowel Sound present, Soft and no tenderness,  Skin: no rashes Extremities: no Pedal edema, no calf tenderness Neurologic: without any new focal findings Gait not checked due to patient safety concerns  Vitals:   06/20/23 2112 06/21/23 0437 06/21/23 0716 06/21/23 0800  BP:  121/72  133/79  Pulse:  81  82  Resp:  20  18  Temp:  97.7 F (36.5 C)  (!) 97.4 F (36.3 C)  TempSrc:  Oral  Oral  SpO2: 91% 97% 97% 98%  Weight:      Height:        Intake/Output Summary (Last 24 hours) at 06/21/2023 1430 Last data filed at 06/21/2023 1429 Gross per 24 hour  Intake 1600 ml  Output 2820 ml  Net -1220 ml   Filed Weights   06/12/23 1745  Weight: 88.9 kg    Data Reviewed: I have personally reviewed and interpreted daily labs, tele strips, imagings as discussed above. I reviewed all nursing notes, pharmacy notes, vitals, pertinent old records I have discussed plan of  care as described above with RN and patient/family.  CBC: Recent Labs  Lab 06/17/23 0329 06/18/23 0455 06/19/23 0443 06/20/23 0456 06/21/23 0404  WBC 18.5* 11.9* 12.3* 12.8* 14.6*  NEUTROABS 16.1* 9.2* 9.1* 9.3* 11.0*  HGB 8.2* 8.7* 8.6* 8.7* 8.9*  HCT 26.9* 28.3* 27.8* 27.8* 28.4*  MCV 86.5 87.1 86.6 85.0 85.0  PLT 684* 839* 880* 906* 862*   Basic Metabolic Panel: Recent Labs  Lab 06/17/23 0329 06/18/23 0455 06/19/23 0443 06/20/23 0456 06/21/23 0404  NA 136 138 135 134* 132*  K 4.3 4.1 3.8 4.0 3.9  CL 96* 93* 95* 92* 89*  CO2 29 31 31  32 33*  GLUCOSE 137* 112* 100* 104*  102*  BUN 15 17 19 17 19   CREATININE 0.59* 0.52* 0.54* 0.67 0.65  CALCIUM 7.8* 8.7* 8.3* 8.7* 8.8*  MG 1.6* 2.0 1.9 1.7 1.7  PHOS 2.5 2.3* 3.0 4.2 3.8    Studies: No results found.  Scheduled Meds:  amLODipine  5 mg Oral Daily   aspirin EC  81 mg Oral Daily   atorvastatin  40 mg Oral QHS   clopidogrel  75 mg Oral Daily   enoxaparin (LOVENOX) injection  50 mg Subcutaneous Q24H   ferrous sulfate  325 mg Oral BID WC   furosemide  40 mg Intravenous BID   gabapentin  300 mg Oral TID   guaiFENesin  600 mg Oral BID   ipratropium-albuterol  3 mL Nebulization BID   metoprolol tartrate  50 mg Oral BID   mometasone-formoterol  2 puff Inhalation BID   montelukast  10 mg Oral Daily   oxymetazoline  1 spray Each Nare BID   pantoprazole  40 mg Oral BID   senna-docusate  1 tablet Oral BID   tamsulosin  0.4 mg Oral QPC supper   Continuous Infusions: PRN Meds: acetaminophen, albuterol, chlorpheniramine-HYDROcodone, guaiFENesin-dextromethorphan, metoprolol tartrate, Muscle Rub, ondansetron, mouth rinse, oxyCODONE, prochlorperazine, sodium chloride  Time spent: 40 minutes  Author: Gillis Santa. MD [facility] 06/21/2023 2:30 PM  To reach On-call, see care teams to locate the attending and reach out to them via [email or web address]. If 7PM-7AM, please contact night-coverage If you still have difficulty reaching the attending provider, please page the [facility] (Director on Call) for [facility] on amion for assistance.

## 2023-06-22 ENCOUNTER — Inpatient Hospital Stay: Payer: Medicare HMO

## 2023-06-22 ENCOUNTER — Other Ambulatory Visit: Payer: Self-pay | Admitting: Internal Medicine

## 2023-06-22 ENCOUNTER — Inpatient Hospital Stay: Payer: Medicare HMO | Attending: Internal Medicine | Admitting: Hospice and Palliative Medicine

## 2023-06-22 ENCOUNTER — Encounter: Payer: Self-pay | Admitting: Internal Medicine

## 2023-06-22 ENCOUNTER — Inpatient Hospital Stay: Payer: Medicare HMO | Admitting: Internal Medicine

## 2023-06-22 DIAGNOSIS — Z5112 Encounter for antineoplastic immunotherapy: Secondary | ICD-10-CM | POA: Insufficient documentation

## 2023-06-22 DIAGNOSIS — K5900 Constipation, unspecified: Secondary | ICD-10-CM | POA: Insufficient documentation

## 2023-06-22 DIAGNOSIS — D509 Iron deficiency anemia, unspecified: Secondary | ICD-10-CM | POA: Insufficient documentation

## 2023-06-22 DIAGNOSIS — Z7951 Long term (current) use of inhaled steroids: Secondary | ICD-10-CM | POA: Insufficient documentation

## 2023-06-22 DIAGNOSIS — C342 Malignant neoplasm of middle lobe, bronchus or lung: Secondary | ICD-10-CM | POA: Insufficient documentation

## 2023-06-22 DIAGNOSIS — Z7952 Long term (current) use of systemic steroids: Secondary | ICD-10-CM | POA: Insufficient documentation

## 2023-06-22 DIAGNOSIS — Z79899 Other long term (current) drug therapy: Secondary | ICD-10-CM | POA: Insufficient documentation

## 2023-06-22 DIAGNOSIS — A419 Sepsis, unspecified organism: Secondary | ICD-10-CM | POA: Diagnosis not present

## 2023-06-22 DIAGNOSIS — I10 Essential (primary) hypertension: Secondary | ICD-10-CM | POA: Insufficient documentation

## 2023-06-22 DIAGNOSIS — Z87891 Personal history of nicotine dependence: Secondary | ICD-10-CM | POA: Insufficient documentation

## 2023-06-22 DIAGNOSIS — C3491 Malignant neoplasm of unspecified part of right bronchus or lung: Secondary | ICD-10-CM

## 2023-06-22 DIAGNOSIS — E871 Hypo-osmolality and hyponatremia: Secondary | ICD-10-CM | POA: Insufficient documentation

## 2023-06-22 DIAGNOSIS — Z7902 Long term (current) use of antithrombotics/antiplatelets: Secondary | ICD-10-CM | POA: Insufficient documentation

## 2023-06-22 DIAGNOSIS — Z7982 Long term (current) use of aspirin: Secondary | ICD-10-CM | POA: Insufficient documentation

## 2023-06-22 DIAGNOSIS — Z5111 Encounter for antineoplastic chemotherapy: Secondary | ICD-10-CM | POA: Insufficient documentation

## 2023-06-22 DIAGNOSIS — C781 Secondary malignant neoplasm of mediastinum: Secondary | ICD-10-CM | POA: Insufficient documentation

## 2023-06-22 MED ORDER — AMLODIPINE BESYLATE 5 MG PO TABS: 5.0000 mg | ORAL_TABLET | Freq: Every day | ORAL | 5 refills | Status: DC

## 2023-06-22 MED ORDER — METOPROLOL SUCCINATE ER 100 MG PO TB24: 100.0000 mg | ORAL_TABLET | Freq: Every day | ORAL | 11 refills | Status: DC

## 2023-06-22 MED ORDER — FERROUS SULFATE 325 (65 FE) MG PO TABS: 325.0000 mg | ORAL_TABLET | Freq: Two times a day (BID) | ORAL | 2 refills | Status: DC

## 2023-06-22 NOTE — TOC Progression Note (Signed)
Transition of Care [facility]) - Progression Note    Patient Details  Name: Austin Patterson MRN: [id number] Date of Birth: 1956/10/18  Transition of Care [facility]) CM/SW Contact  Margarito Liner, LCSW Phone Number: 06/22/2023, 11:19 AM  Clinical Narrative: Per RN, patient will not need any updated oxygen DME. CSW left message for [employer] liaison to let him know that patient will need an oxygen tank to get home per our conversation when he first admitted.  Expected Discharge Plan: Home w Home Health Services Barriers to Discharge: Continued Medical Work up  Expected Discharge Plan and Services     Post Acute Care Choice: NA Living arrangements for the past 2 months: Single Family Home Expected Discharge Date: 06/22/23                                     Social Determinants of Health (SDOH) Interventions SDOH Screenings   Food Insecurity: No Food Insecurity (06/12/2023)  Housing: Low Risk  (06/12/2023)  Transportation Needs: No Transportation Needs (06/12/2023)  Utilities: Not At Risk (06/12/2023)  Tobacco Use: Medium Risk (06/12/2023)    Readmission Risk Interventions    06/14/2023   11:19 AM  Readmission Risk Prevention Plan  Transportation Screening Complete  Medication Review (RN Care Manager) Complete  PCP or Specialist appointment within 3-5 days of discharge Complete  SW Recovery Care/Counseling Consult Complete  Palliative Care Screening Not Applicable  Skilled Nursing Facility Not Applicable

## 2023-06-22 NOTE — Plan of Care (Signed)
  Problem: Activity: Goal: Risk for activity intolerance will decrease Outcome: Progressing   Problem: Nutrition: Goal: Adequate nutrition will be maintained Outcome: Progressing   Problem: Coping: Goal: Level of anxiety will decrease Outcome: Progressing   

## 2023-06-22 NOTE — Plan of Care (Signed)

## 2023-06-22 NOTE — TOC Transition Note (Signed)
Transition of Care [facility]) - CM/SW Discharge Note   Patient Details  Name: Austin Patterson MRN: [id number] Date of Birth: Feb 23, 1956  Transition of Care [facility]) CM/SW Contact:  Margarito Liner, LCSW Phone Number: 06/22/2023, 11:33 AM   Clinical Narrative:  Patient has orders to discharge home today. [facility] liaison is aware. Per RN, oxygen tank was delivered on Friday. No further concerns. CSW signing off.   Final next level of care: Home w Home Health Services Barriers to Discharge: Barriers Resolved   Patient Goals and CMS Choice      Discharge Placement                  Patient to be transferred to facility by: [facility]   Patient and family notified of of transfer: 06/22/23  Discharge Plan and Services Additional resources added to the After Visit Summary for       Post Acute Care Choice: NA                    HH Arranged: RN, PT [facility] Agency: [facility] Date [facility] Agency Contacted: 06/22/23   Representative spoke with at [facility] Agency: [place] [facility]  Social Determinants of Health (SDOH) Interventions SDOH Screenings   Food Insecurity: No Food Insecurity (06/12/2023)  Housing: Low Risk  (06/12/2023)  Transportation Needs: No Transportation Needs (06/12/2023)  Utilities: Not At Risk (06/12/2023)  Tobacco Use: Medium Risk (06/12/2023)     Readmission Risk Interventions    06/14/2023   11:19 AM  Readmission Risk Prevention Plan  Transportation Screening Complete  Medication Review (RN Care Manager) Complete  PCP or Specialist appointment within 3-5 days of discharge Complete  SW Recovery Care/Counseling Consult Complete  Palliative Care Screening Not Applicable  Skilled Nursing Facility Not Applicable

## 2023-06-22 NOTE — Discharge Summary (Signed)
[facility] Discharge Summary   Patient: Austin Patterson WUJ:[id number]  PCP: Pcp, No  Date of admission: 06/12/2023   Date of discharge:  06/22/2023     Discharge Diagnoses:  Principal Problem:   Sepsis (HCC) Active Problems:   Acute on chronic respiratory failure with hypoxia (HCC)   Pulmonary fibrosis (HCC)   Asthma-COPD overlap syndrome   Stage IV adenocarcinoma of lung (HCC)   Paroxysmal atrial fibrillation (HCC)   Admitted From: Home Disposition:  Home with [facility] services   Recommendations for Outpatient Follow-up:  F/u with PCP in 1 wk F/u with Pulmo in 1 wk F/u with Oncologist in 1 wk Follow up LABS/TEST:     Follow-up Information     Sakai, Isami, DO Follow up in 3 week(s).   Specialties: General Surgery, Surgery Why: Chest wall abscess wound check Contact information: [address] [place] [address] [phone]         Salena Saner, MD Follow up in 1 week(s).   Specialty: Pulmonary Disease Contact information: [address] [place] [address] [phone]                Diet recommendation: Cardiac diet  Activity: The patient is advised to gradually reintroduce usual activities, as tolerated  Discharge Condition: stable  Code Status: Full code   History of present illness: As per the H and P dictated on admission Hospital Course:  Austin Patterson is a 67 y.o. 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 Dx stage IV lung cancer this summer, started chemotherapy 3 weeks ago. 2 days ago he started to have a dry cough and wheezing, when he increased his breathing medications. Last night he started to have fever and chills and decided to come to the hospital 08/24.  08/24: to ED from home. Fever 100.3, tachycardia was found in A-fib with RVR heart rate 170-190 and  responded to IV bolus of Cardizem, nonhypotensive nonhypoxic.  X-ray shows no acute infiltrates but no mass on the right lung base.  Blood work showed WBC 22.7, hemoglobin 11.0, creatinine 0.6. Started on vanc, cefepime, azithromycin. Surgical consult - hold plavix, abx, consider removal in few days / sooner if needed 08/25: not meeting sepsis criteria this morning, though still significant leukocytosis 22.5. Plan port-a-cath removal tomorrow  08/26: port-a-cath removal mid-day. BCx NGx2d. WBC down to 16, Hgb better at 8.0. Pot-a-cath removed today. Clinical cellulitis still significant nd new area suspected cellulitis on R foot dorsum, will keep on IV abx through next 24-48 hours  08/27: pt reports more SOB today. Lung sounds coarse/wheezing. CXR on personal read about same as couple days ago, starting steroids, he is already on broad spectrum abx, nebs.    Consultants:  General surgery  Cardiology   Procedures: Removal port cath 06/14/23 w/ Dr Tonna Boehringer     Assessment and Plan: # Sepsis d/t infection of port-a-cath, POA fever, leukocytosis, source infection likely right-sided chest abscess and underneath soft tissue infection associated with port cath.  Atypical pneumonia cannot be ruled out at this point either.   S/p Extended abx coverage with vancomycin, ceftriaxone and azithromycin Blood culture --> Ngx2d, MRSA screening --> negative. port-a-cath removal on 08/26. Cellulitis is improving, de-escalate antibiotics, discontinued vancomycin, ceftriaxone and azithromycin. On 8/28 started doxycycline 100 mg p.o. BID, completed 10-day course of antibiotics on 06/20/23. # New Onset  A-fib with RVR - resolved. repeat EKG showed sinus rhythm CHADS2VASC = 4, Cardiology Dr. Tenny Craw was contacted in the ED, who recommended as needed rate control medications w/ lopressor. 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 Continue ASA,  Cardiology consulted, recommended high risk for bleeding, no need of DOAC at this time Started metoprolol 50 mg p.o. twice daily # Hypomagnesemia, mag repleted. Resolved   # Hypophosphatemia, Phos repleted. Resolved  # Acute COPD exacerbation # Acute hypoxic respiratory failure  # Pulmonary Fibrosis, continue Supplemental O2, s/p Antibiotics as above to cover pneumonia. p.o. steroid --> dose w/ IV today given wheezing improved. Continue Breathing treatment. Incentive spirometry legionella negative, mycoplasma negative. On 8/28 Prednisone 40 mg p.o. daily for 5 days. S/p Lasix 40 mg x 1 dose given by cardiology on 8/28 8/29 Lasix 40 mg IV BID, d/c'd on 9/2, as per patient, does not feel any difference in the breathing 8/31 d/w pulmonologist, CT chest was repeated, 1. Interval decrease in size of the mass lesion in the anterior right lung base with decrease in mediastinal and right hilar lymphadenopathy. 2. Interval increase in size of a 14 mm nodule in the posterior left costophrenic sulcus. 3. Stable right lower lobe pulmonary nodules. 4. Stable bilateral adrenal adenomas. 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 Na 132 stable # Chronic iron deficiency anemia # Acute anemia likely hemodilution from sepsis fluids in ED No signs of active bleeding. Continue oral iron supplement. # CAD, continue aspirin and Plavix. held Plavix for the portCath removing procedure. Resume Plavix on 8/29 # Hematoma, dorsum of right foot, Patient had trauma at home, POA Ultrasound soft tissue shows hematoma versus abscess. Continue warm compresses   Body mass index is 31.65 kg/m.  Nutrition Interventions:  - Patient was instructed, not to drive,  operate heavy machinery, perform activities at heights, swimming or participation in water activities or provide baby sitting services while on Pain, Sleep and Anxiety Medications; until his outpatient Physician has advised to do so again.  - Also recommended to not to take more than prescribed Pain, Sleep and Anxiety Medications.  Patient was seen by physical therapy, who recommended Home health, which was arranged. On the day of the discharge the patient's vitals were stable, and no other acute medical condition were reported by patient. the patient was felt safe to be discharge at Home with Home health.  Consultants: General Surgery, d/w pulmonologist Procedures: Port-A-Cath removal  Discharge Exam: General: Appear in no distress, no Rash; Oral Mucosa Clear, moist. Cardiovascular: S1 and S2 Present, no Murmur, Respiratory: normal respiratory effort, Bilateral Air entry present and no Crackles, no wheezes Abdomen: Bowel Sound present, Soft and no tenderness, no hernia Extremities: no Pedal edema, no calf tenderness Neurology: alert and oriented to time, place, and person affect appropriate.  Filed Weights   06/12/23 1745  Weight: 88.9 kg   Vitals:   06/22/23 0808 06/22/23 0829  BP:  127/69  Pulse:  94  Resp:  20  Temp:  97.8 F (36.6 C)  SpO2: 98% 100%    DISCHARGE MEDICATION: Allergies as of 06/22/2023       Reactions  Amoxicillin Anaphylaxis   Tizanidine    Feet and ankle swell         Medication List     STOP taking these medications    Klor-Con M20 20 MEQ tablet Generic drug: potassium chloride SA   tamsulosin 0.4 MG Caps capsule Commonly known as: FLOMAX       TAKE these medications    acetaminophen 500 MG tablet Commonly known as: TYLENOL Take 1,000 mg by mouth every 6 (six) hours as needed for moderate pain or headache.   AeroChamber MV inhaler Use as instructed   albuterol 108 (90 Base) MCG/ACT inhaler Commonly known as: VENTOLIN HFA Inhale  2 puffs into the lungs every 6 (six) hours as needed for wheezing or shortness of breath.   amLODipine 5 MG tablet Commonly known as: NORVASC Take 1 tablet (5 mg total) by mouth at bedtime. Skip the dose if systolic BP less than 130 mmHg What changed:  medication strength how much to take when to take this additional instructions   aspirin EC 81 MG tablet Take 1 tablet (81 mg total) by mouth daily.   atorvastatin 40 MG tablet Commonly known as: LIPITOR Take 1 tablet (40 mg total) by mouth at bedtime.   Calcium 600/Vitamin D 600-10 MG-MCG Tabs Generic drug: Calcium Carb-Cholecalciferol Take 1 tablet by mouth 2 (two) times daily.   chlorpheniramine-HYDROcodone 10-8 MG/5ML Commonly known as: TUSSIONEX Take 5 mLs by mouth at bedtime as needed for cough.   clopidogrel 75 MG tablet Commonly known as: PLAVIX Take 1 tablet (75 mg total) by mouth daily.   dexamethasone 4 MG tablet Commonly known as: DECADRON Take 2 tablets (8mg ) by mouth daily starting the day after carboplatin for 3 days. Take with food   Dupixent 300 MG/2ML Sopn Generic drug: Dupilumab Inject 300 mg into the skin every 14 (fourteen) days.   EPINEPHrine 0.3 mg/0.3 mL Soaj injection Commonly known as: EPI-PEN Inject 0.3 mg into the muscle as needed for anaphylaxis.   ferrous sulfate 325 (65 FE) MG tablet Take 1 tablet (325 mg total) by mouth 2 (two) times daily with a meal.   fluticasone-salmeterol 250-50 MCG/ACT Aepb Commonly known as: Advair Diskus Inhale 1 puff into the lungs in the morning and at bedtime.   gabapentin 300 MG capsule Commonly known as: NEURONTIN Take 1 capsule (300 mg total) by mouth 3 (three) times daily.   guaiFENesin-dextromethorphan 100-10 MG/5ML syrup Commonly known as: ROBITUSSIN DM Take 5 mLs by mouth every 4 (four) hours as needed for cough.   ipratropium-albuterol 0.5-2.5 (3) MG/3ML Soln Commonly known as: DUONEB INHALE 1 VIAL THROUGH NEBULIZER EVERY 6 HOURS    lidocaine-prilocaine cream Commonly known as: EMLA Apply to affected area once   magnesium oxide 400 (241.3 Mg) MG tablet Commonly known as: MAG-OX Take 1 tablet (400 mg total) by mouth daily.   metoprolol succinate 100 MG 24 hr tablet Commonly known as: TOPROL-XL Take 1 tablet (100 mg total) by mouth daily. What changed:  medication strength how much to take   montelukast 10 MG tablet Commonly known as: SINGULAIR Take 10 mg by mouth daily.   multivitamin with minerals Tabs tablet Take 1 tablet by mouth daily.   ondansetron 8 MG tablet Commonly known as: Zofran Take 1 tablet (8 mg total) by mouth every 8 (eight) hours as needed for nausea or vomiting. Start on the third day after carboplatin.   oxyCODONE 5 MG immediate release tablet Commonly known as: Oxy IR/ROXICODONE Take 1 tablet (5  mg total) by mouth every 6 (six) hours as needed for severe pain.   pantoprazole 40 MG tablet Commonly known as: PROTONIX Take 1 tablet (40 mg total) by mouth 2 (two) times daily.   predniSONE 10 MG tablet Commonly known as: DELTASONE Take 1 tablet (10 mg total) by mouth daily as needed. Take as directed.   prochlorperazine 10 MG tablet Commonly known as: COMPAZINE Take 1 tablet (10 mg total) by mouth every 6 (six) hours as needed for nausea or vomiting.   senna-docusate 8.6-50 MG tablet Commonly known as: Senokot-S Take 1 tablet by mouth 2 (two) times daily.               Durable Medical Equipment  (From admission, onward)           Start     Ordered   06/20/23 1347  For home use only DME oxygen  Once       Question Answer Comment  Length of Need Lifetime   Liters per Minute 4   Frequency Continuous (stationary and portable oxygen unit needed)   Oxygen conserving device Yes   Oxygen delivery system Gas      06/20/23 1346           Allergies  Allergen Reactions   Amoxicillin Anaphylaxis   Tizanidine     Feet and ankle swell    Discharge Instructions      Call MD for:  difficulty breathing, headache or visual disturbances   Complete by: As directed    Call MD for:  extreme fatigue   Complete by: As directed    Call MD for:  persistant dizziness or light-headedness   Complete by: As directed    Call MD for:  severe uncontrolled pain   Complete by: As directed    Call MD for:  temperature >100.4   Complete by: As directed    Diet - low sodium heart healthy   Complete by: As directed    Discharge instructions   Complete by: As directed    F/u with PCP in 1 wk F/u with Pulmo in 1 wk F/u with Oncologist in 1 wk   Increase activity slowly   Complete by: As directed        The results of significant diagnostics from this hospitalization (including imaging, microbiology, ancillary and laboratory) are listed below for reference.    Significant Diagnostic Studies: CT CHEST WO CONTRAST  Result Date: 06/19/2023 CLINICAL DATA:  Interstitial lung disease. EXAM: CT CHEST WITHOUT CONTRAST TECHNIQUE: Multidetector CT imaging of the chest was performed following the standard protocol without IV contrast. RADIATION DOSE REDUCTION: This exam was performed according to the departmental dose-optimization program which includes automated exposure control, adjustment of the mA and/or kV according to patient size and/or use of iterative reconstruction technique. COMPARISON:  Chest x-ray 06/18/2023.  Chest CT 05/11/2023 FINDINGS: Cardiovascular: The heart size is normal. No substantial pericardial effusion. Coronary artery calcification is evident. Moderate atherosclerotic calcification is noted in the wall of the thoracic aorta. Mediastinum/Nodes: 13 mm short axis precarinal lymph node has decreased from 21 mm in the interval. 11 mm short axis right hilar lymph node has also decreased. No other mediastinal lymphadenopathy evident. The esophagus has normal imaging features. There is no axillary lymphadenopathy. Lungs/Pleura: Advanced changes of emphysema noted  with a large bulla in the right mid lung and adjacent compressive atelectasis. The mass lesion identified previously in the anterior right lung base measures 7.1 x 5.9 cm today compared  to 7.7 x 6.5 cm previously. There is some minimal tracking linear gas in the lateral aspect of the mass which may be related to necrosis although it has a branching configuration in some regions suggesting gas within incorporated bronchi. 12 mm right lower lobe nodule on 113/4 was partially obscured by atelectasis on the previous study but is stable comparing back to an exam from 05/10/2023. 18 mm right lower lobe nodule on 120/4 is similar. 14 mm nodule in the posterior left costophrenic sulcus (128/4) has increased from 7 mm on the 05/10/2023 exam. The dependent atelectasis in the right lung base previously has resolved in the interval. Upper Abdomen: Bilateral low-density adrenal nodules are stable with absolute attenuation suggesting adenomas. Musculoskeletal: No worrisome lytic or sclerotic osseous abnormality. Posttraumatic deformity noted right scapular spine. 2 level vertebral augmentation in the thoracic spine. IMPRESSION: 1. Interval decrease in size of the mass lesion in the anterior right lung base with decrease in mediastinal and right hilar lymphadenopathy. 2. Interval increase in size of a 14 mm nodule in the posterior left costophrenic sulcus. 3. Stable right lower lobe pulmonary nodules. 4. Stable bilateral adrenal adenomas. 5. Aortic Atherosclerosis (ICD10-I70.0) and Emphysema (ICD10-J43.9). Electronically Signed   By: Kennith Center M.D.   On: 06/19/2023 16:17   DG Chest Port 1 View  Result Date: 06/18/2023 CLINICAL DATA:  Shortness of breath.  Sepsis. EXAM: PORTABLE CHEST 1 VIEW COMPARISON:  Chest radiograph 06/15/2023 FINDINGS: The cardiomediastinal silhouette is unchanged with normal heart size. A large right lower lung mass and background lung emphysema are again noted. No acute airspace consolidation, overt  pulmonary edema, sizable pleural effusion, or pneumothorax is identified. IMPRESSION: Known right lung mass and emphysema. No evidence of acute airspace disease. Electronically Signed   By: Sebastian Ache M.D.   On: 06/18/2023 14:34   [place] RT LOWER EXTREM LTD SOFT TISSUE NON VASCULAR  Result Date: 06/15/2023 CLINICAL DATA:  Right foot pain, erythema EXAM: ULTRASOUND RIGHT LOWER EXTREMITY LIMITED TECHNIQUE: Ultrasound examination of the lower extremity soft tissues was performed in the area of clinical concern. COMPARISON:  06/15/2023, 05/11/2023 FINDINGS: Sonographic evaluation of the area of erythema and swelling within the dorsal right midfoot was performed. Similar to prior study, there is a heterogeneous hypoechoic area within the subcutaneous tissues of the dorsal right midfoot, measuring 3.1 x 2.8 x 0.7 cm. This likely reflects a complex fluid collection, and could reflect hematoma or abscess. However, the sonographic appearance is nonspecific. Overall, this has decreased in size slightly since prior study. IMPRESSION: 1. Persistent but decreased heterogeneous hypoechoic area within the dorsal soft tissues of the right midfoot, corresponding to the area of erythema and swelling. Given previous history of right foot trauma, this may reflect a resolving subcutaneous hematoma. Abscess cannot be excluded, though sonographic appearance is somewhat atypical. Continued follow-up is recommended. Electronically Signed   By: Sharlet Salina M.D.   On: 06/15/2023 16:09   DG Foot Complete Right  Result Date: 06/15/2023 CLINICAL DATA:  Right foot pain, dorsal erythema EXAM: RIGHT FOOT COMPLETE - 3+ VIEW COMPARISON:  05/11/2023 FINDINGS: Frontal, oblique, and lateral views of the right foot are obtained. Diffuse soft tissue swelling of the right foot is again noted, greatest within the dorsal aspect of the midfoot. This is actually slightly decreased since the 05/11/2023 exam. No subcutaneous gas or radiopaque foreign  body. There are no acute or destructive bony abnormalities. Alignment is anatomic. Stable inferior calcaneal spur. IMPRESSION: 1. Persistent but decreasing soft tissue swelling throughout  the right foot, greatest within the dorsal midfoot. 2. No acute or destructive bony abnormalities. Electronically Signed   By: Sharlet Salina M.D.   On: 06/15/2023 16:06   DG Chest Port 1 View  Result Date: 06/15/2023 CLINICAL DATA:  Cough, short of breath, history of right middle lobe lung cancer EXAM: PORTABLE CHEST 1 VIEW COMPARISON:  05/11/2023, 06/12/2023 FINDINGS: Single frontal view of the chest demonstrates a stable cardiac silhouette. The large mass at the right lung base is unchanged since prior study. Stable background emphysema. No acute airspace disease, effusion, or pneumothorax. No acute bony abnormalities. IMPRESSION: 1. Stable right middle lobe mass consistent with known lung cancer. 2. Stable background emphysema.  No acute airspace disease. Electronically Signed   By: Sharlet Salina M.D.   On: 06/15/2023 16:05   DG Chest Port 1 View  Result Date: 06/12/2023 CLINICAL DATA:  Questionable sepsis. EXAM: PORTABLE CHEST 1 VIEW COMPARISON:  05/11/2023 FINDINGS: Rounded opacity at the right base, a known mass. Interstitial reticulation from emphysema. No acute opacity, effusion, or pneumothorax. Normal heart size and mediastinal contours. Porta catheter with tip at the upper SVC. IMPRESSION: 1. No acute finding when compared to prior. 2. Emphysema and mass at the right lung base. Electronically Signed   By: Tiburcio Pea M.D.   On: 06/12/2023 10:46   IR IMAGING GUIDED PORT INSERTION  Result Date: 05/26/2023 INDICATION: IV access for chemotherapy EXAM: Chest port placement using ultrasound and fluoroscopic guidance MEDICATIONS: Documented in the EMR ANESTHESIA/SEDATION: Moderate (conscious) sedation was employed during this procedure. A total of Versed 1 mg and Fentanyl 50 mcg was administered intravenously.  Moderate Sedation Time: 28 minutes. The patient's level of consciousness and vital signs were monitored continuously by radiology nursing throughout the procedure under my direct supervision. FLUOROSCOPY TIME:  Fluoroscopy Time: 0.4 minutes (2 mGy) COMPLICATIONS: None immediate. PROCEDURE: Informed written consent was obtained from the patient after a thorough discussion of the procedural risks, benefits and alternatives. All questions were addressed. Maximal Sterile Barrier Technique was utilized including caps, mask, sterile gowns, sterile gloves, sterile drape, hand hygiene and skin antiseptic. A timeout was performed prior to the initiation of the procedure. The patient was placed supine on the exam table. The right neck and chest was prepped and draped in the standard sterile fashion. A preliminary ultrasound of the right neck was performed and demonstrates a patent right internal jugular vein. A permanent ultrasound image was stored in the electronic medical record. The overlying skin was anesthetized with 1% Lidocaine. Using ultrasound guidance, access was obtained into the right internal jugular vein using a 21 gauge micropuncture set. A wire was advanced into the SVC, a short incision was made at the puncture site, and serial dilatation performed. Next, in an ipsilateral infraclavicular location, an incision was made at the site of the subcutaneous reservoir. Blunt dissection was used to open a pocket to contain the reservoir. A subcutaneous tunnel was then created from the port site to the puncture site. A(n) 8 Fr single lumen catheter was advanced through the tunnel. The catheter was attached to the port and this was placed in the subcutaneous pocket. Under fluoroscopic guidance, a peel away sheath was placed, and the catheter was trimmed to the appropriate length and was advanced into the central veins. The catheter length is 24 cm. The tip of the catheter lies near the superior cavoatrial junction. The  port flushes and aspirates appropriately. The port was flushed and locked with heparinized saline. The port pocket  was closed in 2 layers using 3-0 and 4-0 Vicryl/absorbable suture. Dermabond was also applied to both incisions. The patient tolerated the procedure well and was transferred to recovery in stable condition. IMPRESSION: Successful placement of a right-sided chest port via the right internal jugular vein. The port is ready for immediate use. Electronically Signed   By: Olive Bass M.D.   On: 05/26/2023 12:10    Microbiology: Recent Results (from the past 240 hour(s))  Blood Culture (routine x 2)     Status: None   Collection Time: 06/12/23  4:03 PM   Specimen: BLOOD  Result Value Ref Range Status   Specimen Description BLOOD BLOOD RIGHT ARM  Final   Special Requests   Final    BOTTLES DRAWN AEROBIC AND ANAEROBIC Blood Culture results may not be optimal due to an excessive volume of blood received in culture bottles   Culture   Final    NO GROWTH 5 DAYS Performed at [facility], [address], [place], [place] [address]    Report Status 06/17/2023 FINAL  Final  Aerobic/Anaerobic Culture w Gram Stain (surgical/deep wound)     Status: None   Collection Time: 06/14/23 12:52 PM   Specimen: Path Tissue  Result Value Ref Range Status   Specimen Description WOUND  Final   Special Requests PORT  Final   Gram Stain NO WBC SEEN NO ORGANISMS SEEN   Final   Culture   Final    No growth aerobically or anaerobically. Performed at [facility], [address], [place], [place] [address]    Report Status 06/19/2023 FINAL  Final  MRSA Next Gen by PCR, Nasal     Status: None   Collection Time: 06/15/23  6:15 PM   Specimen: Nasal Mucosa; Nasal Swab  Result Value Ref Range Status   MRSA by PCR Next Gen NOT DETECTED NOT DETECTED Final    Comment: (NOTE) The GeneXpert MRSA Assay (FDA approved for NASAL specimens only), is one component of a comprehensive MRSA  colonization surveillance program. It is not intended to diagnose MRSA infection nor to guide or monitor treatment for MRSA infections. Test performance is not FDA approved in patients less than 74 years old. Performed at [facility], [address] [place], [place] [address]      Labs: CBC: Recent Labs  Lab 06/17/23 [phone] 06/18/23 0455 06/19/23 0443 06/20/23 0456 06/21/23 0404  WBC 18.5* 11.9* 12.3* 12.8* 14.6*  NEUTROABS 16.1* 9.2* 9.1* 9.3* 11.0*  HGB 8.2* 8.7* 8.6* 8.7* 8.9*  HCT 26.9* 28.3* 27.8* 27.8* 28.4*  MCV 86.5 87.1 86.6 85.0 85.0  PLT 684* 839* 880* 906* 862*   Basic Metabolic Panel: Recent Labs  Lab 06/17/23 0329 06/18/23 0455 06/19/23 0443 06/20/23 0456 06/21/23 0404  NA 136 138 135 134* 132*  K 4.3 4.1 3.8 4.0 3.9  CL 96* 93* 95* 92* 89*  CO2 29 31 31  32 33*  GLUCOSE 137* 112* 100* 104* 102*  BUN 15 17 19 17 19   CREATININE 0.59* 0.52* 0.54* 0.67 0.65  CALCIUM 7.8* 8.7* 8.3* 8.7* 8.8*  MG 1.6* 2.0 1.9 1.7 1.7  PHOS 2.5 2.3* 3.0 4.2 3.8   Liver Function Tests: Recent Labs  Lab 06/16/23 0824  AST 18  ALT 25  ALKPHOS 87  BILITOT 0.3  PROT 6.5  ALBUMIN 2.9*   No results for input(s): "LIPASE", "AMYLASE" in the last 168 hours. No results for input(s): "AMMONIA" in the last 168 hours. Cardiac Enzymes:  No results for input(s): "CKTOTAL", "CKMB", "CKMBINDEX", "TROPONINI" in the last 168 hours. BNP (last 3 results) Recent Labs    12/07/22 1019 04/03/23 1148 06/19/23 0444  BNP 47.8 29.0 22.7   CBG: No results for input(s): "GLUCAP" in the last 168 hours.  Time spent: 35 minutes  Signed:  Gillis Santa  [facility] 06/22/2023 11:14 AM

## 2023-06-24 ENCOUNTER — Encounter: Payer: Self-pay | Admitting: Pulmonary Disease

## 2023-06-24 ENCOUNTER — Ambulatory Visit: Payer: Medicare HMO | Admitting: Pulmonary Disease

## 2023-06-24 ENCOUNTER — Inpatient Hospital Stay: Payer: Medicare HMO

## 2023-06-24 VITALS — BP 132/80 | HR 87 | Temp 97.8°F | Ht 65.98 in | Wt 185.0 lb

## 2023-06-24 DIAGNOSIS — E8801 Alpha-1-antitrypsin deficiency: Secondary | ICD-10-CM

## 2023-06-24 DIAGNOSIS — C3491 Malignant neoplasm of unspecified part of right bronchus or lung: Secondary | ICD-10-CM

## 2023-06-24 DIAGNOSIS — J449 Chronic obstructive pulmonary disease, unspecified: Secondary | ICD-10-CM | POA: Diagnosis not present

## 2023-06-24 DIAGNOSIS — J4550 Severe persistent asthma, uncomplicated: Secondary | ICD-10-CM | POA: Diagnosis not present

## 2023-06-24 NOTE — Patient Instructions (Addendum)
Your lungs sounded clear today.  Continue taking your Advair and your DuoNeb as you are doing.  We will see you in follow-up in 2 months time call sooner should any new problems arise.

## 2023-06-24 NOTE — Progress Notes (Signed)
Subjective:    Patient ID: Austin Patterson, male    DOB: December 05, 1955, 67 y.o.   MRN: [id number]  Patient Care Team: Pcp, No as PCP - General Debbe Odea, MD as PCP - Cardiology (Cardiology) Salena Saner, MD as Consulting Physician (Pulmonary Disease) Glory Buff, RN as Oncology Nurse Navigator Michaelyn Barter, MD as Consulting Physician (Oncology)  No chief complaint on file.   HPI [name] is a 67 year old former smoker (100 PY) with stage IV very severe COPD, severe persistent asthma and chronic respiratory failure with hypoxia who presents for follow-up of these issues.  We last saw him on 23 through 13 May 2023 during brief hospitalization after lung biopsy for a large lung mass.  At baseline the patient is very sedentary.  This is due to debilitating dyspnea.  He is compliant with oxygen at 2 L/min 24/7.  He has a complex respiratory history with very severe asthma with IgE initially recorded at 1950 this was noted to be directed to Aspergillus fumigatus (ABPA).  He is also a heterozygous alpha-1 MZ phenotype with low normal alpha-1 levels.  He has been on Dupixent for management of his asthma component.  He was last admitted to [facility] on 24 August through 22 June 2023 for severe sepsis suspected to be due to Port-A-Cath inserted for chemotherapy for his recently diagnosed lung cancer.  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  The Port-A-Cath had to be removed.  He will have a new port placed next week and resume chemotherapy.  The patient also did have paroxysmal atrial fibrillation during his admission related to his sepsis syndrome however this resolved.  During his hospitalization he also had acute on chronic respiratory failure however since he has been discharged she is back to his baseline oxygen level of 2 L/min and tolerating it well.  He has not had any fevers, chills or sweats since his  discharge.  No cough or sputum production.  No lower extremity edema.  No calf tenderness.  Most recent CT chest of 19 June 2023 showed that his lung mass and satellite lesions had been regressing this after a single chemotherapy treatment.   With regards to his respiratory status, he feels he is pretty much at baseline.  He is very limited with regards to his activities of daily living due to dyspnea.  He is pretty much sedentary.  He is compliant with oxygen therapy.  He is compliant with his respiratory medications.  He is on Dupixent for his severe asthma.   He has had some issues with peripheral neuropathy and was started on gabapentin during one of his hospital stays which he notes helps him.   Review of Systems A 10 point review of systems was performed and it is as noted above otherwise negative.   Patient Active Problem List   Diagnosis Date Noted   Paroxysmal atrial fibrillation (HCC) 06/16/2023   Sepsis (HCC) 06/12/2023   Iron deficiency anemia 06/08/2023   Encounter for antineoplastic chemotherapy 06/01/2023   Stage IV adenocarcinoma of lung (HCC) 05/18/2023   Swelling of right foot 05/11/2023   Heterozygous alpha 1-antitrypsin deficiency (HCC) 04/27/2023   Mediastinal adenopathy 04/13/2023   Cough 04/03/2023   Alcohol use 04/03/2023   Constipation 12/10/2022   COPD exacerbation (HCC) 02/23/2022   GERD without esophagitis 02/23/2022   Alpha-1-antitrypsin deficiency carrier 01/10/2021   History of nonmelanoma skin cancer 12/19/2020   Asthma 03/12/2020   Chronic respiratory  failure with hypoxia (HCC) 03/12/2020   Hyperlipidemia    Asthma-COPD overlap syndrome    Bleeding nose    Alcohol abuse    RUQ abdominal pain    Anaphylaxis 11/01/2019   Hypotension 11/01/2019   CAD (coronary artery disease) 11/01/2019   H/O heart artery stent 11/01/2019   Acute on chronic respiratory failure with hypoxia (HCC)    RUQ pain    Essential hypertension    Pulmonary fibrosis (HCC)  08/30/2019   Hypomagnesemia 03/31/2017   Hiatal hernia    Reflux esophagitis    Hematemesis without nausea    Hyponatremia 09/21/2016   Multifocal pneumonia 12/09/2015    Social History   Tobacco Use   Smoking status: Former    Current packs/day: 0.00    Average packs/day: 2.0 packs/day for 50.0 years (100.0 ttl pk-yrs)    Types: Cigarettes    Start date: 10/13/1965    Quit date: 10/14/2015    Years since quitting: 7.6   Smokeless tobacco: Former  Substance Use Topics   Alcohol use: Yes    Alcohol/week: 56.0 standard drinks of alcohol    Types: 56 Cans of beer per week    Comment: "I sit around and drink beer, that's all I got to do"    Allergies  Allergen Reactions   Amoxicillin Anaphylaxis   Tizanidine     Feet and ankle swell     Current Meds  Medication Sig   acetaminophen (TYLENOL) 500 MG tablet Take 1,000 mg by mouth every 6 (six) hours as needed for moderate pain or headache.   albuterol (VENTOLIN HFA) 108 (90 Base) MCG/ACT inhaler Inhale 2 puffs into the lungs every 6 (six) hours as needed for wheezing or shortness of breath.   amLODipine (NORVASC) 5 MG tablet Take 1 tablet (5 mg total) by mouth at bedtime. Skip the dose if systolic BP less than 130 mmHg   aspirin EC 81 MG tablet Take 1 tablet (81 mg total) by mouth daily.   atorvastatin (LIPITOR) 40 MG tablet Take 1 tablet (40 mg total) by mouth at bedtime.   CALCIUM 600/VITAMIN D 600-10 MG-MCG TABS Take 1 tablet by mouth 2 (two) times daily.   clopidogrel (PLAVIX) 75 MG tablet Take 1 tablet (75 mg total) by mouth daily.   dexamethasone (DECADRON) 4 MG tablet Take 2 tablets (8mg ) by mouth daily starting the day after carboplatin for 3 days. Take with food   Dupilumab (DUPIXENT) 300 MG/2ML SOPN Inject 300 mg into the skin every 14 (fourteen) days.   EPINEPHrine 0.3 mg/0.3 mL IJ SOAJ injection Inject 0.3 mg into the muscle as needed for anaphylaxis.   ferrous sulfate 325 (65 FE) MG tablet Take 1 tablet (325 mg  total) by mouth 2 (two) times daily with a meal.   fluticasone-salmeterol (ADVAIR DISKUS) 250-50 MCG/ACT AEPB Inhale 1 puff into the lungs in the morning and at bedtime.   gabapentin (NEURONTIN) 300 MG capsule Take 1 capsule (300 mg total) by mouth 3 (three) times daily.   guaiFENesin-dextromethorphan (ROBITUSSIN DM) 100-10 MG/5ML syrup Take 5 mLs by mouth every 4 (four) hours as needed for cough.   ipratropium-albuterol (DUONEB) 0.5-2.5 (3) MG/3ML SOLN INHALE 1 VIAL THROUGH NEBULIZER EVERY 6 HOURS   magnesium oxide (MAG-OX) 400 (241.3 Mg) MG tablet Take 1 tablet (400 mg total) by mouth daily.   metoprolol succinate (TOPROL-XL) 100 MG 24 hr tablet Take 1 tablet (100 mg total) by mouth daily.   montelukast (SINGULAIR) 10 MG tablet Take 10  mg by mouth daily.   Multiple Vitamin (MULTIVITAMIN WITH MINERALS) TABS tablet Take 1 tablet by mouth daily.   ondansetron (ZOFRAN) 8 MG tablet Take 1 tablet (8 mg total) by mouth every 8 (eight) hours as needed for nausea or vomiting. Start on the third day after carboplatin.   oxyCODONE (OXY IR/ROXICODONE) 5 MG immediate release tablet Take 1 tablet (5 mg total) by mouth every 6 (six) hours as needed for severe pain.   pantoprazole (PROTONIX) 40 MG tablet Take 1 tablet (40 mg total) by mouth 2 (two) times daily.   prochlorperazine (COMPAZINE) 10 MG tablet Take 1 tablet (10 mg total) by mouth every 6 (six) hours as needed for nausea or vomiting.   senna-docusate (SENOKOT-S) 8.6-50 MG tablet Take 1 tablet by mouth 2 (two) times daily.    Immunization History  Administered Date(s) Administered   Influenza Inj Mdck Quad Pf 08/10/2019   Influenza Inj Mdck Quad With Preservative 07/18/2018   Influenza,inj,Quad PF,6+ Mos 10/15/2015, 08/31/2016   Influenza-Unspecified 08/10/2019, 07/19/2020, 07/19/2022   Moderna SARS-COV2 Booster Vaccination 10/23/2020   Moderna Sars-Covid-2 Vaccination 11/21/2019, 12/21/2019   Pneumococcal Polysaccharide-23 10/15/2015       Objective:     BP 132/80 (BP Location: Right Arm, Cuff Size: Normal)   Pulse 87   Temp 97.8 F (36.6 C)   Ht 5' 5.98" (1.676 m)   Wt 185 lb (83.9 kg)   SpO2 97%   BMI 29.88 kg/m   SpO2: 97 % O2 Device: Nasal cannula O2 Flow Rate (L/min): 2 L/min O2 Type: Continuous O2  GENERAL: Chronically ill appearing gentleman, chronic use of accessories, presents in transport chair.  Wearing oxygen via nasal cannula.  No acute respiratory distress.  No plethora.  HEAD: Normocephalic, atraumatic. EYES: Pupils equal, round, reactive to light.  No scleral icterus. MOUTH: Teeth in poor repair, oral mucosa moist, no thrush. NECK: Supple. No thyromegaly. Trachea midline. No JVD.  No adenopathy. PULMONARY: Increased AP diameter, significant kyphosis. Distant breath sounds.  No adventitious sounds.  Positive Hoover's sign. CARDIOVASCULAR: S1 and S2. Regular rate and rhythm.  Distant heart tones no murmur appreciated. GASTROINTESTINAL: Protuberant abdomen otherwise benign. MUSCULOSKELETAL: No joint deformity, no clubbing, no edema. NEUROLOGIC: No overt focal deficit.  Speech is fluent.  Awake, alert. SKIN: Intact,warm,dry. PSYCH: Mood and behavior normal.    Assessment & Plan:     ICD-10-CM   1. 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 Stage 4 very severe COPD by GOLD classification (HCC)  J44.9    Continue Advair 250/51 puff twice a day Continue DuoNeb 4 times a day Continue as needed albuterol    3. Severe persistent asthma, unspecified whether complicated  J45.50    Continue medications as above Continue Dupixent    4. Heterozygous alpha 1-antitrypsin deficiency (HCC)  E88.01    Continue to monitor alpha-1 levels periodically     From the pulmonary standpoint Temujin is pretty much at baseline.  We will see him in follow-up in 2 months time he is to call sooner should any new problems arise.   Gailen Shelter,  MD Advanced Bronchoscopy PCCM [facility]    *This note was dictated using voice recognition software/Dragon.  Despite best efforts to proofread, errors can occur which can change the meaning. Any transcriptional errors that result from this process are unintentional and may not be fully corrected at the time of dictation.

## 2023-06-28 ENCOUNTER — Encounter: Payer: Self-pay | Admitting: Internal Medicine

## 2023-07-02 MED FILL — Dexamethasone Sodium Phosphate Inj 100 MG/10ML: INTRAMUSCULAR | Qty: 1 | Status: AC

## 2023-07-02 MED FILL — Fosaprepitant Dimeglumine For IV Infusion 150 MG (Base Eq): INTRAVENOUS | Qty: 5 | Status: AC

## 2023-07-05 ENCOUNTER — Encounter: Payer: Self-pay | Admitting: *Deleted

## 2023-07-05 ENCOUNTER — Inpatient Hospital Stay: Payer: Medicare HMO

## 2023-07-05 ENCOUNTER — Other Ambulatory Visit: Payer: Medicare HMO

## 2023-07-05 ENCOUNTER — Inpatient Hospital Stay (HOSPITAL_BASED_OUTPATIENT_CLINIC_OR_DEPARTMENT_OTHER): Payer: Medicare HMO | Admitting: Internal Medicine

## 2023-07-05 VITALS — BP 123/71 | HR 89 | Temp 97.8°F | Wt 185.0 lb

## 2023-07-05 VITALS — BP 156/76 | HR 86

## 2023-07-05 DIAGNOSIS — D509 Iron deficiency anemia, unspecified: Secondary | ICD-10-CM | POA: Diagnosis not present

## 2023-07-05 DIAGNOSIS — Z7952 Long term (current) use of systemic steroids: Secondary | ICD-10-CM | POA: Diagnosis not present

## 2023-07-05 DIAGNOSIS — Z5112 Encounter for antineoplastic immunotherapy: Secondary | ICD-10-CM | POA: Diagnosis present

## 2023-07-05 DIAGNOSIS — Z7902 Long term (current) use of antithrombotics/antiplatelets: Secondary | ICD-10-CM | POA: Diagnosis not present

## 2023-07-05 DIAGNOSIS — C3491 Malignant neoplasm of unspecified part of right bronchus or lung: Secondary | ICD-10-CM | POA: Diagnosis not present

## 2023-07-05 DIAGNOSIS — Z7951 Long term (current) use of inhaled steroids: Secondary | ICD-10-CM | POA: Diagnosis not present

## 2023-07-05 DIAGNOSIS — Z7982 Long term (current) use of aspirin: Secondary | ICD-10-CM | POA: Diagnosis not present

## 2023-07-05 DIAGNOSIS — Z79899 Other long term (current) drug therapy: Secondary | ICD-10-CM | POA: Diagnosis not present

## 2023-07-05 DIAGNOSIS — K5900 Constipation, unspecified: Secondary | ICD-10-CM | POA: Diagnosis not present

## 2023-07-05 DIAGNOSIS — E871 Hypo-osmolality and hyponatremia: Secondary | ICD-10-CM | POA: Diagnosis not present

## 2023-07-05 DIAGNOSIS — I10 Essential (primary) hypertension: Secondary | ICD-10-CM | POA: Diagnosis not present

## 2023-07-05 DIAGNOSIS — C781 Secondary malignant neoplasm of mediastinum: Secondary | ICD-10-CM | POA: Diagnosis not present

## 2023-07-05 DIAGNOSIS — Z5111 Encounter for antineoplastic chemotherapy: Secondary | ICD-10-CM | POA: Diagnosis present

## 2023-07-05 DIAGNOSIS — G893 Neoplasm related pain (acute) (chronic): Secondary | ICD-10-CM | POA: Insufficient documentation

## 2023-07-05 DIAGNOSIS — E876 Hypokalemia: Secondary | ICD-10-CM

## 2023-07-05 DIAGNOSIS — Z87891 Personal history of nicotine dependence: Secondary | ICD-10-CM | POA: Diagnosis not present

## 2023-07-05 DIAGNOSIS — C342 Malignant neoplasm of middle lobe, bronchus or lung: Secondary | ICD-10-CM | POA: Diagnosis present

## 2023-07-05 LAB — CBC WITH DIFFERENTIAL (CANCER CENTER ONLY)
Abs Immature Granulocytes: 0.03 10*3/uL (ref 0.00–0.07)
Basophils Absolute: 0 10*3/uL (ref 0.0–0.1)
Basophils Relative: 1 %
Eosinophils Absolute: 0.3 10*3/uL (ref 0.0–0.5)
Eosinophils Relative: 3 %
HCT: 32.2 % — ABNORMAL LOW (ref 39.0–52.0)
Hemoglobin: 9.9 g/dL — ABNORMAL LOW (ref 13.0–17.0)
Immature Granulocytes: 0 %
Lymphocytes Relative: 13 %
Lymphs Abs: 1 10*3/uL (ref 0.7–4.0)
MCH: 27 pg (ref 26.0–34.0)
MCHC: 30.7 g/dL (ref 30.0–36.0)
MCV: 87.7 fL (ref 80.0–100.0)
Monocytes Absolute: 0.6 10*3/uL (ref 0.1–1.0)
Monocytes Relative: 8 %
Neutro Abs: 5.6 10*3/uL (ref 1.7–7.7)
Neutrophils Relative %: 75 %
Platelet Count: 287 10*3/uL (ref 150–400)
RBC: 3.67 MIL/uL — ABNORMAL LOW (ref 4.22–5.81)
RDW: 18.9 % — ABNORMAL HIGH (ref 11.5–15.5)
WBC Count: 7.5 10*3/uL (ref 4.0–10.5)
nRBC: 0 % (ref 0.0–0.2)

## 2023-07-05 LAB — CMP (CANCER CENTER ONLY)
ALT: 23 U/L (ref 0–44)
AST: 26 U/L (ref 15–41)
Albumin: 3.9 g/dL (ref 3.5–5.0)
Alkaline Phosphatase: 63 U/L (ref 38–126)
Anion gap: 11 (ref 5–15)
BUN: 10 mg/dL (ref 8–23)
CO2: 25 mmol/L (ref 22–32)
Calcium: 9.3 mg/dL (ref 8.9–10.3)
Chloride: 94 mmol/L — ABNORMAL LOW (ref 98–111)
Creatinine: 0.68 mg/dL (ref 0.61–1.24)
GFR, Estimated: >60 mL/min (ref >60)
Glucose, Bld: 114 mg/dL — ABNORMAL HIGH (ref 70–99)
Potassium: 4.4 mmol/L (ref 3.5–5.1)
Sodium: 130 mmol/L — ABNORMAL LOW (ref 135–145)
Total Bilirubin: 0.8 mg/dL (ref 0.3–1.2)
Total Protein: 7.2 g/dL (ref 6.5–8.1)

## 2023-07-05 MED ORDER — FAMOTIDINE IN NACL 20-0.9 MG/50ML-% IV SOLN
20.0000 mg | Freq: Once | INTRAVENOUS | Status: AC
  Administered 2023-07-05: 20 mg via INTRAVENOUS
  Filled 2023-07-05: qty 50

## 2023-07-05 MED ORDER — OXYCODONE HCL 5 MG PO TABS: 5.0000 mg | ORAL_TABLET | Freq: Three times a day (TID) | ORAL | 0 refills | Status: DC | PRN

## 2023-07-05 MED ORDER — SODIUM CHLORIDE 0.9 % IV SOLN
150.0000 mg | Freq: Once | INTRAVENOUS | Status: AC
  Administered 2023-07-05: 150 mg via INTRAVENOUS
  Filled 2023-07-05: qty 150

## 2023-07-05 MED ORDER — DIPHENHYDRAMINE HCL 50 MG/ML IJ SOLN
50.0000 mg | Freq: Once | INTRAMUSCULAR | Status: AC
  Administered 2023-07-05: 50 mg via INTRAVENOUS
  Filled 2023-07-05: qty 1

## 2023-07-05 MED ORDER — PALONOSETRON HCL INJECTION 0.25 MG/5ML
0.2500 mg | Freq: Once | INTRAVENOUS | Status: AC
  Administered 2023-07-05: 0.25 mg via INTRAVENOUS
  Filled 2023-07-05: qty 5

## 2023-07-05 MED ORDER — SODIUM CHLORIDE 0.9 % IV SOLN
Freq: Once | INTRAVENOUS | Status: AC
  Filled 2023-07-05: qty 250

## 2023-07-05 MED ORDER — SODIUM CHLORIDE 0.9 % IV SOLN
200.0000 mg | Freq: Once | INTRAVENOUS | Status: AC
  Administered 2023-07-05: 200 mg via INTRAVENOUS
  Filled 2023-07-05: qty 200

## 2023-07-05 MED ORDER — SODIUM CHLORIDE 0.9 % IV SOLN
10.0000 mg | Freq: Once | INTRAVENOUS | Status: AC
  Administered 2023-07-05: 10 mg via INTRAVENOUS
  Filled 2023-07-05: qty 10

## 2023-07-05 MED ORDER — SODIUM CHLORIDE 0.9 % IV SOLN
125.0000 mg/m2 | Freq: Once | INTRAVENOUS | Status: AC
  Administered 2023-07-05: 258 mg via INTRAVENOUS
  Filled 2023-07-05: qty 43

## 2023-07-05 MED ORDER — GUAIFENESIN 100 MG/5ML PO LIQD: 5.0000 mL | ORAL | 1 refills | Status: DC | PRN

## 2023-07-05 MED ORDER — SODIUM CHLORIDE 0.9 % IV SOLN
473.6000 mg | Freq: Once | INTRAVENOUS | Status: AC
  Administered 2023-07-05: 470 mg via INTRAVENOUS
  Filled 2023-07-05: qty 47

## 2023-07-05 NOTE — Progress Notes (Signed)
[facility] CONSULT NOTE  Patient Care Team: Pcp, No as PCP - General Debbe Odea, MD as PCP - Cardiology (Cardiology) Salena Saner, MD as Consulting Physician (Pulmonary Disease) Glory Buff, RN as Oncology Nurse Navigator Michaelyn Barter, MD as Consulting Physician (Oncology)   CANCER STAGING   Cancer Staging  Stage IV adenocarcinoma of lung [facility]) Staging form: Lung, AJCC 8th Edition - Clinical: Stage IV (cT4, cN3, cM1) - Signed by Michaelyn Barter, MD on 05/20/2023 Stage prefix: Initial diagnosis   ASSESSMENT & PLAN:  Austin Patterson 67 y.o. 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  Pathology showed poorly differentiated carcinoma with spindle cell component.  Tumor cells positive for CK and focal TTF-1.  The differential diagnosis includes pleomorphic carcinoma and carcinosarcoma.  Definitive classification cannot be performed on the biopsy specimen.  - PET CT scan from 04/19/2023 showed 7.1 cm right lung mass with SUV of 17.9, bilateral mediastinal and hilar hypermetabolic nodes, hypermetabolic porta hepatis node 1.4 cm SUV 7.2, right lower lobe hypermetabolic pulmonary nodule favoring synchronous primary. Postbiopsy had COPD exacerbation, patient had to be hospitalized.  Repeat CT from 7/22 did show new 0.5 cm left lower lobe nodule.  Also increase in the size of dominant mass from  7.1 x 5.5 to 7.7 x 6 cm.  Also increase in the size of 2 RLL nodules.  MRI brain with and without contrast was negative for metastatic disease.  -Tempus testing showed PD-L1 0% with no targetable mutations.  -Admitted from 06/12/2023 to 06/22/2023 for acute on chronic respiratory failure and sepsis secondary to port infection.  He has completed antibiotics.  - Labs reviewed and acceptable for treatment.  Will proceed with cycle 2 of carboplatin AUC 4 and Taxol 125 mg/m with Keytruda.  G-CSF injection on 9/18.  Causes bone aches.  Discussed about continuing with Claritin for 5 days, Tylenol, and oxycodone as needed.  Dose reduced to due to poor functional status. CT chest done in the hospital after 1 cycle shows interval decrease in the size of dominant mass from 7.7 x 6.5 to 7.1 x 5.9 cm.  Also decrease in mediastinal and right hilar adenopathy.  Will repeat CT imaging after total 4 cycles.  RTC in 1 week for supportive care.  # Chronic respiratory failure on 2 L oxygen # Asthma and COPD overlap # Idiopathic pulmonary fibrosis -Management per pulmonary.  On Dupixent -Cough-patient requested prescription for Mucinex syrup which was sent.  # Hyponatremia -Chronic in nature.  Sodium 129 at baseline.  # Normocytic anemia -Iron panel consistent with iron deficiency.  He is taking p.o. iron twice a day causing constipation.  Advised him to go down to 3 times a week. -Also we will plan for IV Venofer 200 mg weekly x 5 doses.  Next dose in 1 week.  # Cancer related pain -In mid chest.  Continue with oxycodone 5 mg Q8 as needed.  Refill today.  # IV access-port removed on 8/26 due to sepsis.  Has follow-up scheduled with Dr. Tonna Boehringer  Orders Placed This Encounter  Procedures   CBC with Differential (Cancer Center Only)    Standing Status:   Future    Standing Expiration Date:   07/04/2024   Iron and TIBC([facility])    Standing Status:   Future    Standing Expiration Date:   07/04/2024    Ferritin    Standing Status:   Future    Standing Expiration Date:   07/04/2024   Folate    Standing Status:   Future    Standing Expiration Date:   07/04/2024   Vitamin B12    Standing Status:   Future    Standing Expiration Date:   07/04/2024   RTC in 1 week for APP visit, labs, fluids, Venofer RTC in 3 weeks for MD visit, labs, cycle 3 of carbo Taxol and Keytruda  The total time spent in the appointment was 30 minutes encounter with patients including review of chart and various tests results, discussions about plan of care and coordination of care plan   All questions were answered. The patient knows to call the clinic with any problems, questions or concerns. No barriers to learning was detected.  Michaelyn Barter, MD 9/16/202410:51 AM   HISTORY OF PRESENTING ILLNESS:  Austin Patterson 67 y.o. 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  CTA chest showed new 6.8 x 4.9 cm soft tissue mass in the anterior right midlung field.  10 mm nodule in the right lower lung field.  1.8 cm nodule in the right lower lung field.  Large bulla and emphysema present.  Enlarged lymph nodes in the mediastinum measuring 2.1 x 1.9 cm right hilum 2.8 x 1.8 cm.  2.1 cm nodule in right adrenal.  1.8 cm nodule in the left adrenal suggestive of adenomas.  No evidence of PE.    Patient was seen today in the clinic for further evaluation.  He is a remote smoker quit in 2016.  Prior smoked 2 packs a day since age 41.  He is on 2 L oxygen since 2017.  Lives alone.  Was in a wheelchair.  Does not have family close by.   Follows with Dr. Jayme Cloud.   Interval history Patient was seen today for follow-up for stage IV lung adenocarcinoma and cycle 2 of CarboTaxol and Keytruda. He is feeling well overall.  Recently discharged from hospital after sepsis.  Had port removed.  Reports body achiness after G-CSF injection.  Otherwise denies any nausea or vomiting with prior chemo cycle.  Appetite varies.  Energy level varies.  I have reviewed his chart and materials related to his cancer extensively and collaborated history with the patient. Summary of oncologic history is as follows: His breathing is stable Oncology History  Stage IV adenocarcinoma of lung (HCC)  05/18/2023 Initial Diagnosis   Sarcomatoid carcinoma of lung (HCC)   05/20/2023 Cancer Staging   Staging form: Lung, AJCC 8th Edition - Clinical: Stage IV (cT4, cN3, cM1) - Signed by Michaelyn Barter, MD on 05/20/2023 Stage prefix: Initial diagnosis   06/01/2023 -  Chemotherapy   Patient is on Treatment Plan : LUNG NSCLC Carboplatin (6) +  Paclitaxel (200) + Pembrolizumab (200) D1 q21d x 4 cycles / Pembrolizumab (200) Maintenance D1 q21d       MEDICAL HISTORY:  Past Medical History:  Diagnosis Date   Abdominal pain 12/07/2022   Acute urinary retention 12/07/2022   Chest pain 01/24/2022   COPD (chronic obstructive pulmonary disease) (HCC)    GERD (gastroesophageal reflux disease)    Hemothorax on right 05/11/2023   Hyperlipidemia    Hypertension    Pleuritic pain 05/10/2023   Pneumothorax after biopsy 05/11/2023   Post procedure discomfort 05/13/2023   Pulmonary fibrosis (HCC) 11/2015    SURGICAL HISTORY: Past Surgical History:  Procedure Laterality Date   COLONOSCOPY     CORONARY STENT PLACEMENT     ESOPHAGOGASTRODUODENOSCOPY (EGD) WITH PROPOFOL N/A 09/23/2016   Procedure: ESOPHAGOGASTRODUODENOSCOPY (EGD) WITH PROPOFOL;  Surgeon: Wyline Mood, MD;  Location: [facility] ENDOSCOPY;  Service: Endoscopy;  Laterality: N/A;   IR IMAGING GUIDED PORT INSERTION   05/26/2023   KYPHOPLASTY N/A 03/14/2020   Procedure: T7 & T11 KYPHOPLASTY;  Surgeon: Kennedy Bucker, MD;  Location: [facility] ORS;  Service: Orthopedics;  Laterality: N/A;   PORT-A-CATH REMOVAL N/A 06/14/2023   Procedure: REMOVAL PORT-A-CATH;  Surgeon: Sung Amabile, DO;  Location: [facility] ORS;  Service: General;  Laterality: N/A;   SHOULDER ACROMIOPLASTY      SOCIAL HISTORY: Social History   Socioeconomic History   Marital status: Legally Separated    Spouse name: Not on file   Number of children: Not on file   Years of education: Not on file   Highest education level: Not on file  Occupational History   Not on file  Tobacco Use   Smoking status: Former    Current packs/day: 0.00    Average packs/day: 2.0 packs/day for 50.0 years (100.0 ttl pk-yrs)    Types: Cigarettes    Start date: 10/13/1965    Quit date: 10/14/2015    Years since quitting: 7.7   Smokeless tobacco: Former  Building services engineer status: Never Used  Substance and Sexual Activity   Alcohol use: Yes    Alcohol/week: 56.0 standard drinks of alcohol    Types: 56 Cans of beer per week    Comment: "I sit around and drink beer, that's all I got to do"   Drug use: No   Sexual activity: Not Currently  Other Topics Concern   Not on file  Social History Narrative   Not on file   Social Determinants of Health   Financial Resource Strain: Not on file  Food Insecurity: No Food Insecurity (06/12/2023)   Hunger Vital Sign    Worried About Running Out of Food in the Last Year: Never true    Ran Out of Food in the Last Year: Never true  Transportation Needs: No Transportation Needs (06/12/2023)   PRAPARE - Administrator, Civil Service (Medical): No    Lack of Transportation (Non-Medical): No  Physical Activity: Not on file  Stress: Not on file  Social Connections: Not on file  Intimate Partner Violence: Not At Risk (06/12/2023)   Humiliation, Afraid, Rape, and Kick questionnaire    Fear of Current or Ex-Partner: No     Emotionally Abused: No    Physically Abused: No    Sexually Abused: No    FAMILY HISTORY: Family History  Problem Relation Age of Onset   Heart disease Mother     ALLERGIES:  is allergic to amoxicillin and tizanidine.  MEDICATIONS:  Current Outpatient Medications  Medication  Sig Dispense Refill   acetaminophen (TYLENOL) 500 MG tablet Take 1,000 mg by mouth every 6 (six) hours as needed for moderate pain or headache.     albuterol (VENTOLIN HFA) 108 (90 Base) MCG/ACT inhaler Inhale 2 puffs into the lungs every 6 (six) hours as needed for wheezing or shortness of breath. 8 g 11   amLODipine (NORVASC) 5 MG tablet Take 1 tablet (5 mg total) by mouth at bedtime. Skip the dose if systolic BP less than 130 mmHg 30 tablet 5   aspirin EC 81 MG tablet Take 1 tablet (81 mg total) by mouth daily. 90 tablet 3   atorvastatin (LIPITOR) 40 MG tablet Take 1 tablet (40 mg total) by mouth at bedtime. 90 tablet 3   CALCIUM 600/VITAMIN D 600-10 MG-MCG TABS Take 1 tablet by mouth 2 (two) times daily.     clopidogrel (PLAVIX) 75 MG tablet Take 1 tablet (75 mg total) by mouth daily. 90 tablet 3   dexamethasone (DECADRON) 4 MG tablet Take 2 tablets (8mg ) by mouth daily starting the day after carboplatin for 3 days. Take with food 30 tablet 1   Dupilumab (DUPIXENT) 300 MG/2ML SOPN Inject 300 mg into the skin every 14 (fourteen) days. 12 mL 1   EPINEPHrine 0.3 mg/0.3 mL IJ SOAJ injection Inject 0.3 mg into the muscle as needed for anaphylaxis.     ferrous sulfate 325 (65 FE) MG tablet Take 1 tablet (325 mg total) by mouth 2 (two) times daily with a meal. 60 tablet 2   fluticasone-salmeterol (ADVAIR DISKUS) 250-50 MCG/ACT AEPB Inhale 1 puff into the lungs in the morning and at bedtime. 60 each 11   gabapentin (NEURONTIN) 300 MG capsule Take 1 capsule (300 mg total) by mouth 3 (three) times daily. 90 capsule 1   guaiFENesin (ROBITUSSIN) 100 MG/5ML liquid Take 5 mLs by mouth every 4 (four) hours as needed for  cough or to loosen phlegm. 236 mL 1   guaiFENesin-dextromethorphan (ROBITUSSIN DM) 100-10 MG/5ML syrup Take 5 mLs by mouth every 4 (four) hours as needed for cough. 118 mL 0   ipratropium-albuterol (DUONEB) 0.5-2.5 (3) MG/3ML SOLN INHALE 1 VIAL THROUGH NEBULIZER EVERY 6 HOURS 270 mL 2   lidocaine-prilocaine (EMLA) cream Apply to affected area once 30 g 3   magnesium oxide (MAG-OX) 400 (241.3 Mg) MG tablet Take 1 tablet (400 mg total) by mouth daily. 30 tablet 0   metoprolol succinate (TOPROL-XL) 100 MG 24 hr tablet Take 1 tablet (100 mg total) by mouth daily. 30 tablet 11   montelukast (SINGULAIR) 10 MG tablet Take 10 mg by mouth daily.     Multiple Vitamin (MULTIVITAMIN WITH MINERALS) TABS tablet Take 1 tablet by mouth daily. 30 tablet 0   ondansetron (ZOFRAN) 8 MG tablet Take 1 tablet (8 mg total) by mouth every 8 (eight) hours as needed for nausea or vomiting. Start on the third day after carboplatin. 30 tablet 1   pantoprazole (PROTONIX) 40 MG tablet Take 1 tablet (40 mg total) by mouth 2 (two) times daily. 90 tablet 3   predniSONE (DELTASONE) 10 MG tablet Take 1 tablet (10 mg total) by mouth daily as needed. Take as directed. 30 tablet 1   prochlorperazine (COMPAZINE) 10 MG tablet Take 1 tablet (10 mg total) by mouth every 6 (six) hours as needed for nausea or vomiting. 30 tablet 1   senna-docusate (SENOKOT-S) 8.6-50 MG tablet Take 1 tablet by mouth 2 (two) times daily. 30 tablet 1   Spacer/Aero-Holding Chambers (  AEROCHAMBER MV) inhaler Use as instructed 1 each 0   chlorpheniramine-HYDROcodone (TUSSIONEX) 10-8 MG/5ML Take 5 mLs by mouth at bedtime as needed for cough. (Patient not taking: Reported on 06/12/2023) 70 mL 0   oxyCODONE (OXY IR/ROXICODONE) 5 MG immediate release tablet Take 1 tablet (5 mg total) by mouth every 8 (eight) hours as needed for up to 90 doses for severe pain. 90 tablet 0   No current facility-administered medications for this visit.   Facility-Administered Medications  Ordered in Other Visits  Medication Dose Route Frequency Provider Last Rate Last Admin   CARBOplatin (PARAPLATIN) 470 mg in sodium chloride 0.9 % 250 mL chemo infusion  470 mg Intravenous Once Michaelyn Barter, MD       dexamethasone (DECADRON) 10 mg in sodium chloride 0.9 % 50 mL IVPB  10 mg Intravenous Once Michaelyn Barter, MD 204 mL/hr at 07/05/23 1045 10 mg at 07/05/23 1045   diphenhydrAMINE (BENADRYL) injection 50 mg  50 mg Intravenous Once Michaelyn Barter, MD       famotidine (PEPCID) IVPB 20 mg premix  20 mg Intravenous Once Michaelyn Barter, MD       fosaprepitant (EMEND) 150 mg in sodium chloride 0.9 % 145 mL IVPB  150 mg Intravenous Once Michaelyn Barter, MD 450 mL/hr at 07/05/23 1044 150 mg at 07/05/23 1044   PACLitaxel (TAXOL) 258 mg in sodium chloride 0.9 % 250 mL chemo infusion (> 80mg /m2)  125 mg/m2 (Treatment Plan Recorded) Intravenous Once Michaelyn Barter, MD       palonosetron (ALOXI) injection 0.25 mg  0.25 mg Intravenous Once Michaelyn Barter, MD       pembrolizumab [facility]) 200 mg in sodium chloride 0.9 % 50 mL chemo infusion  200 mg Intravenous Once Michaelyn Barter, MD        REVIEW OF SYSTEMS:   Pertinent information mentioned in HPI All other systems were reviewed with the patient and are negative.  PHYSICAL EXAMINATION: ECOG PERFORMANCE STATUS: 2 - Symptomatic, <50% confined to bed   Vitals:   07/05/23 0900  BP: 123/71  Pulse: 89  Temp: 97.8 F (36.6 C)  SpO2: 100%    Filed Weights   07/05/23 0900  Weight: 185 lb (83.9 kg)     GENERAL:alert, no distress and comfortable SKIN: skin color, texture, turgor are normal, no rashes or significant lesions EYES: normal, conjunctiva are pink and non-injected, sclera clear OROPHARYNX:no exudate, no erythema and lips, buccal mucosa, and tongue normal  NECK: supple, thyroid normal size, non-tender, without nodularity LYMPH:  no palpable lymphadenopathy in the cervical, axillary or inguinal LUNGS: clear to  auscultation and percussion with normal breathing effort HEART: regular rate & rhythm and no murmurs and no lower extremity edema ABDOMEN:abdomen soft, non-tender and normal bowel sounds Musculoskeletal:no cyanosis of digits and no clubbing  PSYCH: alert & oriented x 3 with fluent speech NEURO: no focal motor/sensory deficits  LABORATORY DATA:  I have reviewed the data as listed Lab Results  Component Value Date   WBC 7.5 07/05/2023   HGB 9.9 (L) 07/05/2023   HCT 32.2 (L) 07/05/2023   MCV 87.7 07/05/2023   PLT 287 07/05/2023   Recent Labs    06/12/23 1020 06/13/23 0452 06/16/23 0824 06/17/23 0329 06/20/23 0456 06/21/23 0404 07/05/23 0839  NA 133*   < > 133*   < > 134* 132* 130*  K 3.3*   < > 3.5   < > 4.0 3.9 4.4  CL 95*   < > 98   < >  92* 89* 94*  CO2 25   < > 28   < > 32 33* 25  GLUCOSE 144*   < > 152*   < > 104* 102* 114*  BUN 10   < > 10   < > 17 19 10   CREATININE 0.69   < > 0.55*   < > 0.67 0.65 0.68  CALCIUM 7.6*   < > 7.0*   < > 8.7* 8.8* 9.3  GFRNONAA >60   < > >60   < > >60 >60 >60  PROT 7.0  --  6.5  --   --   --  7.2  ALBUMIN 3.6  --  2.9*  --   --   --  3.9  AST 20  --  18  --   --   --  26  ALT 18  --  25  --   --   --  23  ALKPHOS 106  --  87  --   --   --  63  BILITOT 0.7  --  0.3  --   --   --  0.8  BILIDIR  --   --  <0.1  --   --   --   --   IBILI  --   --  NOT CALCULATED  --   --   --   --    < > = values in this interval not displayed.    RADIOGRAPHIC STUDIES: I have personally reviewed the radiological images as listed and agreed with the findings in the report. CT CHEST WO CONTRAST  Result Date: 06/19/2023 CLINICAL DATA:  Interstitial lung disease. EXAM: CT CHEST WITHOUT CONTRAST TECHNIQUE: Multidetector CT imaging of the chest was performed following the standard protocol without IV contrast. RADIATION DOSE REDUCTION: This exam was performed according to the departmental dose-optimization program which includes automated exposure control,  adjustment of the mA and/or kV according to patient size and/or use of iterative reconstruction technique. COMPARISON:  Chest x-ray 06/18/2023.  Chest CT 05/11/2023 FINDINGS: Cardiovascular: The heart size is normal. No substantial pericardial effusion. Coronary artery calcification is evident. Moderate atherosclerotic calcification is noted in the wall of the thoracic aorta. Mediastinum/Nodes: 13 mm short axis precarinal lymph node has decreased from 21 mm in the interval. 11 mm short axis right hilar lymph node has also decreased. No other mediastinal lymphadenopathy evident. The esophagus has normal imaging features. There is no axillary lymphadenopathy. Lungs/Pleura: Advanced changes of emphysema noted with a large bulla in the right mid lung and adjacent compressive atelectasis. The mass lesion identified previously in the anterior right lung base measures 7.1 x 5.9 cm today compared to 7.7 x 6.5 cm previously. There is some minimal tracking linear gas in the lateral aspect of the mass which may be related to necrosis although it has a branching configuration in some regions suggesting gas within incorporated bronchi. 12 mm right lower lobe nodule on 113/4 was partially obscured by atelectasis on the previous study but is stable comparing back to an exam from 05/10/2023. 18 mm right lower lobe nodule on 120/4 is similar. 14 mm nodule in the posterior left costophrenic sulcus (128/4) has increased from 7 mm on the 05/10/2023 exam. The dependent atelectasis in the right lung base previously has resolved in the interval. Upper Abdomen: Bilateral low-density adrenal nodules are stable with absolute attenuation suggesting adenomas. Musculoskeletal: No worrisome lytic or sclerotic osseous abnormality. Posttraumatic deformity noted right scapular spine. 2 level vertebral augmentation in  the thoracic spine. IMPRESSION: 1. Interval decrease in size of the mass lesion in the anterior right lung base with decrease in  mediastinal and right hilar lymphadenopathy. 2. Interval increase in size of a 14 mm nodule in the posterior left costophrenic sulcus. 3. Stable right lower lobe pulmonary nodules. 4. Stable bilateral adrenal adenomas. 5. Aortic Atherosclerosis (ICD10-I70.0) and Emphysema (ICD10-J43.9). Electronically Signed   By: Kennith Center M.D.   On: 06/19/2023 16:17   DG Chest Port 1 View  Result Date: 06/18/2023 CLINICAL DATA:  Shortness of breath.  Sepsis. EXAM: PORTABLE CHEST 1 VIEW COMPARISON:  Chest radiograph 06/15/2023 FINDINGS: The cardiomediastinal silhouette is unchanged with normal heart size. A large right lower lung mass and background lung emphysema are again noted. No acute airspace consolidation, overt pulmonary edema, sizable pleural effusion, or pneumothorax is identified. IMPRESSION: Known right lung mass and emphysema. No evidence of acute airspace disease. Electronically Signed   By: Sebastian Ache M.D.   On: 06/18/2023 14:34   [place] RT LOWER EXTREM LTD SOFT TISSUE NON VASCULAR  Result Date: 06/15/2023 CLINICAL DATA:  Right foot pain, erythema EXAM: ULTRASOUND RIGHT LOWER EXTREMITY LIMITED TECHNIQUE: Ultrasound examination of the lower extremity soft tissues was performed in the area of clinical concern. COMPARISON:  06/15/2023, 05/11/2023 FINDINGS: Sonographic evaluation of the area of erythema and swelling within the dorsal right midfoot was performed. Similar to prior study, there is a heterogeneous hypoechoic area within the subcutaneous tissues of the dorsal right midfoot, measuring 3.1 x 2.8 x 0.7 cm. This likely reflects a complex fluid collection, and could reflect hematoma or abscess. However, the sonographic appearance is nonspecific. Overall, this has decreased in size slightly since prior study. IMPRESSION: 1. Persistent but decreased heterogeneous hypoechoic area within the dorsal soft tissues of the right midfoot, corresponding to the area of erythema and swelling. Given previous history  of right foot trauma, this may reflect a resolving subcutaneous hematoma. Abscess cannot be excluded, though sonographic appearance is somewhat atypical. Continued follow-up is recommended. Electronically Signed   By: Sharlet Salina M.D.   On: 06/15/2023 16:09   DG Foot Complete Right  Result Date: 06/15/2023 CLINICAL DATA:  Right foot pain, dorsal erythema EXAM: RIGHT FOOT COMPLETE - 3+ VIEW COMPARISON:  05/11/2023 FINDINGS: Frontal, oblique, and lateral views of the right foot are obtained. Diffuse soft tissue swelling of the right foot is again noted, greatest within the dorsal aspect of the midfoot. This is actually slightly decreased since the 05/11/2023 exam. No subcutaneous gas or radiopaque foreign body. There are no acute or destructive bony abnormalities. Alignment is anatomic. Stable inferior calcaneal spur. IMPRESSION: 1. Persistent but decreasing soft tissue swelling throughout the right foot, greatest within the dorsal midfoot. 2. No acute or destructive bony abnormalities. Electronically Signed   By: Sharlet Salina M.D.   On: 06/15/2023 16:06   DG Chest Port 1 View  Result Date: 06/15/2023 CLINICAL DATA:  Cough, short of breath, history of right middle lobe lung cancer EXAM: PORTABLE CHEST 1 VIEW COMPARISON:  05/11/2023, 06/12/2023 FINDINGS: Single frontal view of the chest demonstrates a stable cardiac silhouette. The large mass at the right lung base is unchanged since prior study. Stable background emphysema. No acute airspace disease, effusion, or pneumothorax. No acute bony abnormalities. IMPRESSION: 1. Stable right middle lobe mass consistent with known lung cancer. 2. Stable background emphysema.  No acute airspace disease. Electronically Signed   By: Sharlet Salina M.D.   On: 06/15/2023 16:05   DG Chest [facility]  1 View  Result Date: 06/12/2023 CLINICAL DATA:  Questionable sepsis. EXAM: PORTABLE CHEST 1 VIEW COMPARISON:  05/11/2023 FINDINGS: Rounded opacity at the right base, a known  mass. Interstitial reticulation from emphysema. No acute opacity, effusion, or pneumothorax. Normal heart size and mediastinal contours. Porta catheter with tip at the upper SVC. IMPRESSION: 1. No acute finding when compared to prior. 2. Emphysema and mass at the right lung base. Electronically Signed   By: Tiburcio Pea M.D.   On: 06/12/2023 10:46

## 2023-07-05 NOTE — Progress Notes (Signed)
Patient is having burning sensation in his lung and needs a refill on his pain medication.

## 2023-07-05 NOTE — Progress Notes (Signed)
Met with patient during follow up visit with Dr. Alena Bills. All questions answered during visit. Nothing further needed at this time. Instructed pt to call with any questions or needs. Pt verbalized understanding.

## 2023-07-05 NOTE — Patient Instructions (Signed)
[facility]  Discharge Instructions: Thank you for choosing [facility] to provide your oncology and hematology care.  If you have a lab appointment with the Cancer Center, please go directly to the Cancer Center and check in at the registration area.  Wear comfortable clothing and clothing appropriate for easy access to any Portacath or PICC line.   We strive to give you quality time with your provider. You may need to reschedule your appointment if you arrive late (15 or more minutes).  Arriving late affects you and other patients whose appointments are after yours.  Also, if you miss three or more appointments without notifying the office, you may be dismissed from the clinic at the provider's discretion.      For prescription refill requests, have your pharmacy contact our office and allow 72 hours for refills to be completed.     To help prevent nausea and vomiting after your treatment, we encourage you to take your nausea medication as directed.  BELOW ARE SYMPTOMS THAT SHOULD BE REPORTED IMMEDIATELY: *FEVER GREATER THAN 100.4 F (38 C) OR HIGHER *CHILLS OR SWEATING *NAUSEA AND VOMITING THAT IS NOT CONTROLLED WITH YOUR NAUSEA MEDICATION *UNUSUAL SHORTNESS OF BREATH *UNUSUAL BRUISING OR BLEEDING *URINARY PROBLEMS (pain or burning when urinating, or frequent urination) *BOWEL PROBLEMS (unusual diarrhea, constipation, pain near the anus) TENDERNESS IN MOUTH AND THROAT WITH OR WITHOUT PRESENCE OF ULCERS (sore throat, sores in mouth, or a toothache) UNUSUAL RASH, SWELLING OR PAIN   Items with * indicate a potential emergency and should be followed up as soon as possible or go to the Emergency Department if any problems should occur.  Please show the CHEMOTHERAPY ALERT CARD or IMMUNOTHERAPY ALERT CARD at check-in to the Emergency Department and triage nurse.  Should you have questions after your visit or need to cancel or reschedule your  appointment, please contact [facility]  [phone] and follow the prompts.  Office hours are 8:00 a.m. to 4:30 p.m. Monday - Friday. Please note that voicemails left after 4:00 p.m. may not be returned until the following business day.  We are closed weekends and major holidays. You have access to a nurse at all times for urgent questions. Please call the main number to the clinic [phone] and follow the prompts.  For any non-urgent questions, you may also contact your provider using MyChart. We now offer e-Visits for anyone 18 and older to request care online for non-urgent symptoms. For details visit [email or web address].   Also download the MyChart app! Go to the app store, search "MyChart", open the app, select [place], and log in with your MyChart username and password.

## 2023-07-06 ENCOUNTER — Other Ambulatory Visit: Payer: Self-pay | Admitting: Pulmonary Disease

## 2023-07-06 NOTE — Telephone Encounter (Signed)
Dr. Jayme Cloud, please advise on refill.  Thanks

## 2023-07-07 ENCOUNTER — Inpatient Hospital Stay: Payer: Medicare HMO

## 2023-07-07 VITALS — BP 124/64 | HR 84

## 2023-07-07 DIAGNOSIS — Z5112 Encounter for antineoplastic immunotherapy: Secondary | ICD-10-CM | POA: Diagnosis not present

## 2023-07-07 DIAGNOSIS — C3491 Malignant neoplasm of unspecified part of right bronchus or lung: Secondary | ICD-10-CM

## 2023-07-07 MED ORDER — PEGFILGRASTIM-JMDB 6 MG/0.6ML ~~LOC~~ SOSY
6.0000 mg | PREFILLED_SYRINGE | Freq: Once | SUBCUTANEOUS | Status: AC
  Administered 2023-07-07: 6 mg via SUBCUTANEOUS
  Filled 2023-07-07: qty 0.6

## 2023-07-07 NOTE — Telephone Encounter (Signed)
Ok to refill 

## 2023-07-08 ENCOUNTER — Encounter: Payer: Self-pay | Admitting: Internal Medicine

## 2023-07-12 ENCOUNTER — Encounter: Payer: Self-pay | Admitting: *Deleted

## 2023-07-12 ENCOUNTER — Encounter: Payer: Self-pay | Admitting: Nurse Practitioner

## 2023-07-12 ENCOUNTER — Inpatient Hospital Stay: Payer: Medicare HMO

## 2023-07-12 ENCOUNTER — Inpatient Hospital Stay (HOSPITAL_BASED_OUTPATIENT_CLINIC_OR_DEPARTMENT_OTHER): Payer: Medicare HMO | Admitting: Nurse Practitioner

## 2023-07-12 ENCOUNTER — Other Ambulatory Visit: Payer: Self-pay | Admitting: *Deleted

## 2023-07-12 VITALS — BP 131/59 | HR 85

## 2023-07-12 VITALS — BP 122/66 | HR 93 | Temp 98.5°F | Wt 193.0 lb

## 2023-07-12 DIAGNOSIS — T402X5A Adverse effect of other opioids, initial encounter: Secondary | ICD-10-CM

## 2023-07-12 DIAGNOSIS — K5903 Drug induced constipation: Secondary | ICD-10-CM

## 2023-07-12 DIAGNOSIS — M898X9 Other specified disorders of bone, unspecified site: Secondary | ICD-10-CM

## 2023-07-12 DIAGNOSIS — D509 Iron deficiency anemia, unspecified: Secondary | ICD-10-CM

## 2023-07-12 DIAGNOSIS — Z5112 Encounter for antineoplastic immunotherapy: Secondary | ICD-10-CM | POA: Diagnosis not present

## 2023-07-12 DIAGNOSIS — Z09 Encounter for follow-up examination after completed treatment for conditions other than malignant neoplasm: Secondary | ICD-10-CM

## 2023-07-12 DIAGNOSIS — C3491 Malignant neoplasm of unspecified part of right bronchus or lung: Secondary | ICD-10-CM

## 2023-07-12 DIAGNOSIS — Z452 Encounter for adjustment and management of vascular access device: Secondary | ICD-10-CM

## 2023-07-12 DIAGNOSIS — E876 Hypokalemia: Secondary | ICD-10-CM

## 2023-07-12 LAB — BASIC METABOLIC PANEL - CANCER CENTER ONLY
Anion gap: 12 (ref 5–15)
BUN: 11 mg/dL (ref 8–23)
CO2: 28 mmol/L (ref 22–32)
Calcium: 9.1 mg/dL (ref 8.9–10.3)
Chloride: 94 mmol/L — ABNORMAL LOW (ref 98–111)
Creatinine: 0.69 mg/dL (ref 0.61–1.24)
GFR, Estimated: >60 mL/min (ref >60)
Glucose, Bld: 130 mg/dL — ABNORMAL HIGH (ref 70–99)
Potassium: 3.7 mmol/L (ref 3.5–5.1)
Sodium: 134 mmol/L — ABNORMAL LOW (ref 135–145)

## 2023-07-12 LAB — CBC WITH DIFFERENTIAL (CANCER CENTER ONLY)
Abs Immature Granulocytes: 0.07 10*3/uL (ref 0.00–0.07)
Basophils Absolute: 0 10*3/uL (ref 0.0–0.1)
Basophils Relative: 0 %
Eosinophils Absolute: 0.4 10*3/uL (ref 0.0–0.5)
Eosinophils Relative: 3 %
HCT: 31.1 % — ABNORMAL LOW (ref 39.0–52.0)
Hemoglobin: 9.5 g/dL — ABNORMAL LOW (ref 13.0–17.0)
Immature Granulocytes: 1 %
Lymphocytes Relative: 11 %
Lymphs Abs: 1.2 10*3/uL (ref 0.7–4.0)
MCH: 27.1 pg (ref 26.0–34.0)
MCHC: 30.5 g/dL (ref 30.0–36.0)
MCV: 88.9 fL (ref 80.0–100.0)
Monocytes Absolute: 0.8 10*3/uL (ref 0.1–1.0)
Monocytes Relative: 7 %
Neutro Abs: 9 10*3/uL — ABNORMAL HIGH (ref 1.7–7.7)
Neutrophils Relative %: 78 %
Platelet Count: 256 10*3/uL (ref 150–400)
RBC: 3.5 MIL/uL — ABNORMAL LOW (ref 4.22–5.81)
RDW: 19.3 % — ABNORMAL HIGH (ref 11.5–15.5)
Smear Review: NORMAL
WBC Count: 11.4 10*3/uL — ABNORMAL HIGH (ref 4.0–10.5)
nRBC: 0 % (ref 0.0–0.2)

## 2023-07-12 MED ORDER — SODIUM CHLORIDE 0.9 % IV SOLN
200.0000 mg | Freq: Once | INTRAVENOUS | Status: AC
  Administered 2023-07-12: 200 mg via INTRAVENOUS
  Filled 2023-07-12: qty 200

## 2023-07-12 MED ORDER — SODIUM CHLORIDE 0.9 % IV SOLN
Freq: Once | INTRAVENOUS | Status: AC
  Filled 2023-07-12: qty 250

## 2023-07-12 NOTE — Patient Instructions (Signed)
Iron Sucrose Injection What is this medication? IRON SUCROSE (EYE ern SOO krose) treats low levels of iron (iron deficiency anemia) in people with kidney disease. Iron is a mineral that plays an important role in making red blood cells, which carry oxygen from your lungs to the rest of your body. This medicine may be used for other purposes; ask your health care provider or pharmacist if you have questions. COMMON BRAND NAME(S): Venofer What should I tell my care team before I take this medication? They need to know if you have any of these conditions: Anemia not caused by low iron levels Heart disease High levels of iron in the blood Kidney disease Liver disease An unusual or allergic reaction to iron, other medications, foods, dyes, or preservatives Pregnant or trying to get pregnant Breastfeeding How should I use this medication? This medication is for infusion into a vein. It is given in a hospital or clinic setting. Talk to your care team about the use of this medication in children. While this medication may be prescribed for children as young as 2 years for selected conditions, precautions do apply. Overdosage: If you think you have taken too much of this medicine contact a poison control center or emergency room at once. NOTE: This medicine is only for you. Do not share this medicine with others. What if I miss a dose? Keep appointments for follow-up doses. It is important not to miss your dose. Call your care team if you are unable to keep an appointment. What may interact with this medication? Do not take this medication with any of the following: Deferoxamine Dimercaprol Other iron products This medication may also interact with the following: Chloramphenicol Deferasirox This list may not describe all possible interactions. Give your health care provider a list of all the medicines, herbs, non-prescription drugs, or dietary supplements you use. Also tell them if you smoke,  drink alcohol, or use illegal drugs. Some items may interact with your medicine. What should I watch for while using this medication? Visit your care team regularly. Tell your care team if your symptoms do not start to get better or if they get worse. You may need blood work done while you are taking this medication. You may need to follow a special diet. Talk to your care team. Foods that contain iron include: whole grains/cereals, dried fruits, beans, or peas, leafy green vegetables, and organ meats (liver, kidney). What side effects may I notice from receiving this medication? Side effects that you should report to your care team as soon as possible: Allergic reactions--skin rash, itching, hives, swelling of the face, lips, tongue, or throat Low blood pressure--dizziness, feeling faint or lightheaded, blurry vision Shortness of breath Side effects that usually do not require medical attention (report to your care team if they continue or are bothersome): Flushing Headache Joint pain Muscle pain Nausea Pain, redness, or irritation at injection site This list may not describe all possible side effects. Call your doctor for medical advice about side effects. You may report side effects to FDA at 1-800-FDA-1088. Where should I keep my medication? This medication is given in a hospital or clinic. It will not be stored at home. NOTE: This sheet is a summary. It may not cover all possible information. If you have questions about this medicine, talk to your doctor, pharmacist, or health care provider.  2024 [employer]/Gold Standard (2023-03-12 00:00:00)

## 2023-07-12 NOTE — Patient Instructions (Signed)
It is common for patients who are undergoing treatment and taking certain prescribed medications to experience side-effects with constipation.  If you experience constipation, please take stool softeners such as Senna and/or Miralax every day to avoid constipation.  These medications are available over the counter.  Of course, if you have diarrhea, stop taking stool softeners.  Drinking plenty of fluid, eating fruits and vegetable, and being active also reduces the risk of constipation.   If despite taking stool softeners, and you still have no bowel movement for 2 days or more than your normal bowel habit frequency, please take one of the following over the counter laxatives:  Milk of Magnesia or Mag Citrate everyday and contact me immediately for further instructions.  The goal is to have at least one bowel movement every day or every other day without pain or straining.

## 2023-07-12 NOTE — Progress Notes (Signed)
[facility] CONSULT NOTE  Patient Care Team: Pcp, No as PCP - General Debbe Odea, MD as PCP - Cardiology (Cardiology) Salena Saner, MD as Consulting Physician (Pulmonary Disease) Glory Buff, RN as Oncology Nurse Navigator Michaelyn Barter, MD as Consulting Physician (Oncology)  CANCER STAGING   Cancer Staging  Stage IV adenocarcinoma of lung [facility]) Staging form: Lung, AJCC 8th Edition - Clinical: Stage IV (cT4, cN3, cM1) - Signed by Michaelyn Barter, MD on 05/20/2023 Stage prefix: Initial diagnosis  HISTORY OF PRESENTING ILLNESS:  Austin Patterson 67 y.o. 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  CTA chest showed new 6.8 x 4.9 cm soft tissue mass in the anterior right midlung field.  10 mm nodule in the right lower lung field.  1.8 cm nodule in the right lower lung field.  Large bulla and emphysema present.  Enlarged lymph nodes in the mediastinum measuring 2.1 x 1.9 cm right hilum 2.8 x 1.8 cm.  2.1 cm nodule in right adrenal.  1.8 cm nodule in the left adrenal suggestive of adenomas.  No evidence of PE.    He is a remote smoker quit in 2016.  Prior smoked 2 packs a day since age 32.  He is on 2 L oxygen since 2017.  Lives alone.  Does not have family close by.  Follows with Dr. Jayme Cloud.   Oncology History  Stage IV adenocarcinoma of lung (HCC)  05/18/2023 Initial Diagnosis   Sarcomatoid carcinoma of lung (HCC)   05/20/2023 Cancer Staging   Staging form: Lung, AJCC 8th Edition - Clinical: Stage IV (cT4, cN3, cM1) - Signed by Michaelyn Barter, MD on  05/20/2023 Stage prefix: Initial diagnosis   06/01/2023 -  Chemotherapy   Patient is on Treatment Plan : LUNG NSCLC Carboplatin (6) + Paclitaxel (200) + Pembrolizumab (200) D1 q21d x 4 cycles / Pembrolizumab (200) Maintenance D1 q21d      Interval history: Patient returns to clinic for chemotherapy follow up. Tolerated second cycle well without significant side effects. Has not been taking dexamethasone after chemo. Complains of constipation. Took dulcolax but hasn't had results. Mowed grass yesterday. Breathing is about the same. Has held off on oxycodone b/c it worsens constipation. Aches and pains from fulphila are improved. Drank 2 tall boys last night. Eating and drinking normally.   MEDICAL HISTORY:  Past Medical History:  Diagnosis Date   Abdominal pain 12/07/2022   Acute urinary retention 12/07/2022   Chest pain 01/24/2022   COPD (chronic obstructive pulmonary disease) (HCC)    GERD (gastroesophageal reflux disease)    Hemothorax on right 05/11/2023   Hyperlipidemia    Hypertension    Pleuritic pain 05/10/2023   Pneumothorax after biopsy 05/11/2023   Post procedure discomfort 05/13/2023   Pulmonary fibrosis (HCC) 11/2015    SURGICAL HISTORY: Past Surgical History:  Procedure Laterality Date   COLONOSCOPY     CORONARY STENT PLACEMENT     ESOPHAGOGASTRODUODENOSCOPY (EGD) WITH PROPOFOL N/A 09/23/2016   Procedure: ESOPHAGOGASTRODUODENOSCOPY (EGD) WITH PROPOFOL;  Surgeon: Wyline Mood, MD;  Location: [facility] ENDOSCOPY;  Service: Endoscopy;  Laterality: N/A;   IR IMAGING GUIDED PORT INSERTION  05/26/2023  KYPHOPLASTY N/A 03/14/2020   Procedure: T7 & T11 KYPHOPLASTY;  Surgeon: Kennedy Bucker, MD;  Location: [facility] ORS;  Service: Orthopedics;  Laterality: N/A;   PORT-A-CATH REMOVAL N/A 06/14/2023   Procedure: REMOVAL PORT-A-CATH;  Surgeon: Sung Amabile, DO;  Location: [facility] ORS;  Service: General;  Laterality: N/A;   SHOULDER ACROMIOPLASTY      SOCIAL HISTORY: Social History    Socioeconomic History   Marital status: Legally Separated    Spouse name: Not on file   Number of children: Not on file   Years of education: Not on file   Highest education level: Not on file  Occupational History   Not on file  Tobacco Use   Smoking status: Former    Current packs/day: 0.00    Average packs/day: 2.0 packs/day for 50.0 years (100.0 ttl pk-yrs)    Types: Cigarettes    Start date: 10/13/1965    Quit date: 10/14/2015    Years since quitting: 7.7   Smokeless tobacco: Former  Building services engineer status: Never Used  Substance and Sexual Activity   Alcohol use: Yes    Alcohol/week: 56.0 standard drinks of alcohol    Types: 56 Cans of beer per week    Comment: "I sit around and drink beer, that's all I got to do"   Drug use: No   Sexual activity: Not Currently  Other Topics Concern   Not on file  Social History Narrative   Not on file   Social Determinants of Health   Financial Resource Strain: Not on file  Food Insecurity: No Food Insecurity (06/12/2023)   Hunger Vital Sign    Worried About Running Out of Food in the Last Year: Never true    Ran Out of Food in the Last Year: Never true  Transportation Needs: No Transportation Needs (06/12/2023)   PRAPARE - Administrator, Civil Service (Medical): No    Lack of Transportation (Non-Medical): No  Physical Activity: Not on file  Stress: Not on file  Social Connections: Not on file  Intimate Partner Violence: Not At Risk (06/12/2023)   Humiliation, Afraid, Rape, and Kick questionnaire    Fear of Current or Ex-Partner: No    Emotionally Abused: No    Physically Abused: No    Sexually Abused: No    FAMILY HISTORY: Family History  Problem Relation Age of Onset   Heart disease Mother     ALLERGIES:  is allergic to amoxicillin and tizanidine.  MEDICATIONS:  Current Outpatient Medications  Medication Sig Dispense Refill   acetaminophen (TYLENOL) 500 MG tablet Take 1,000 mg by mouth every 6  (six) hours as needed for moderate pain or headache.     albuterol (VENTOLIN HFA) 108 (90 Base) MCG/ACT inhaler Inhale 2 puffs into the lungs every 6 (six) hours as needed for wheezing or shortness of breath. 8 g 11   amLODipine (NORVASC) 5 MG tablet Take 1 tablet (5 mg total) by mouth at bedtime. Skip the dose if systolic BP less than 130 mmHg 30 tablet 5   aspirin EC 81 MG tablet Take 1 tablet (81 mg total) by mouth daily. 90 tablet 3   atorvastatin (LIPITOR) 40 MG tablet Take 1 tablet (40 mg total) by mouth at bedtime. 90 tablet 3   CALCIUM 600/VITAMIN D 600-10 MG-MCG TABS Take 1 tablet by mouth 2 (two) times daily.     clopidogrel (PLAVIX) 75 MG tablet Take 1 tablet (75 mg total) by mouth daily. 90 tablet  3   dexamethasone (DECADRON) 4 MG tablet Take 2 tablets (8mg ) by mouth daily starting the day after carboplatin for 3 days. Take with food 30 tablet 1   Dupilumab (DUPIXENT) 300 MG/2ML SOPN Inject 300 mg into the skin every 14 (fourteen) days. 12 mL 1   EPINEPHrine 0.3 mg/0.3 mL IJ SOAJ injection Inject 0.3 mg into the muscle as needed for anaphylaxis.     ferrous sulfate 325 (65 FE) MG tablet Take 1 tablet (325 mg total) by mouth 2 (two) times daily with a meal. 60 tablet 2   fluticasone-salmeterol (ADVAIR DISKUS) 250-50 MCG/ACT AEPB Inhale 1 puff into the lungs in the morning and at bedtime. 60 each 11   gabapentin (NEURONTIN) 300 MG capsule TAKE 1 CAPSULE BY MOUTH THREE TIMES A DAY 90 capsule 1   guaiFENesin (ROBITUSSIN) 100 MG/5ML liquid Take 5 mLs by mouth every 4 (four) hours as needed for cough or to loosen phlegm. 236 mL 1   guaiFENesin-dextromethorphan (ROBITUSSIN DM) 100-10 MG/5ML syrup Take 5 mLs by mouth every 4 (four) hours as needed for cough. 118 mL 0   ipratropium-albuterol (DUONEB) 0.5-2.5 (3) MG/3ML SOLN INHALE 1 VIAL THROUGH NEBULIZER EVERY 6 HOURS 270 mL 2   lidocaine-prilocaine (EMLA) cream Apply to affected area once 30 g 3   magnesium oxide (MAG-OX) 400 (241.3 Mg) MG  tablet Take 1 tablet (400 mg total) by mouth daily. 30 tablet 0   metoprolol succinate (TOPROL-XL) 100 MG 24 hr tablet Take 1 tablet (100 mg total) by mouth daily. 30 tablet 11   montelukast (SINGULAIR) 10 MG tablet Take 10 mg by mouth daily.     Multiple Vitamin (MULTIVITAMIN WITH MINERALS) TABS tablet Take 1 tablet by mouth daily. 30 tablet 0   ondansetron (ZOFRAN) 8 MG tablet Take 1 tablet (8 mg total) by mouth every 8 (eight) hours as needed for nausea or vomiting. Start on the third day after carboplatin. 30 tablet 1   oxyCODONE (OXY IR/ROXICODONE) 5 MG immediate release tablet Take 1 tablet (5 mg total) by mouth every 8 (eight) hours as needed for up to 90 doses for severe pain. 90 tablet 0   pantoprazole (PROTONIX) 40 MG tablet Take 1 tablet (40 mg total) by mouth 2 (two) times daily. 90 tablet 3   predniSONE (DELTASONE) 10 MG tablet Take 1 tablet (10 mg total) by mouth daily as needed. Take as directed. 30 tablet 1   prochlorperazine (COMPAZINE) 10 MG tablet Take 1 tablet (10 mg total) by mouth every 6 (six) hours as needed for nausea or vomiting. 30 tablet 1   senna-docusate (SENOKOT-S) 8.6-50 MG tablet Take 1 tablet by mouth 2 (two) times daily. 30 tablet 1   Spacer/Aero-Holding Chambers (AEROCHAMBER MV) inhaler Use as instructed 1 each 0   chlorpheniramine-HYDROcodone (TUSSIONEX) 10-8 MG/5ML Take 5 mLs by mouth at bedtime as needed for cough. (Patient not taking: Reported on 06/12/2023) 70 mL 0   No current facility-administered medications for this visit.    REVIEW OF SYSTEMS:   Review of Systems  Constitutional:  Positive for malaise/fatigue. Negative for chills, fever and weight loss.  HENT:  Negative for hearing loss, nosebleeds, sore throat and tinnitus.   Eyes:  Negative for blurred vision and double vision.  Respiratory:  Positive for cough, sputum production and shortness of breath. Negative for hemoptysis and wheezing.   Cardiovascular:  Negative for chest pain, palpitations  and leg swelling.  Gastrointestinal:  Positive for constipation. Negative for abdominal pain, blood in  stool, diarrhea, melena, nausea and vomiting.  Genitourinary:  Negative for dysuria, hematuria and urgency.  Musculoskeletal:  Positive for joint pain and myalgias. Negative for back pain and falls.  Skin:  Negative for itching and rash.  Neurological:  Negative for dizziness, tingling, sensory change, loss of consciousness, weakness and headaches.  Endo/Heme/Allergies:  Negative for environmental allergies. Does not bruise/bleed easily.  Psychiatric/Behavioral:  Negative for depression. The patient is not nervous/anxious and does not have insomnia.     PHYSICAL EXAMINATION: ECOG PERFORMANCE STATUS: 2 - Symptomatic, <50% confined to bed Vitals:   07/12/23 1032  BP: 122/66  Pulse: 93  Temp: 98.5 F (36.9 C)  SpO2: 96%   Filed Weights   07/12/23 1032  Weight: 193 lb (87.5 kg)   General: Well-developed, well-nourished, no acute distress. In wheelchair. On oxygen via [place].  Eyes: Pink conjunctiva, anicteric sclera. Lungs: diminished air movement bilaterally. No wheezes or rhonchi.  Heart: Regular rate and rhythm.  Abdomen: Soft, nontender, nondistended.  Musculoskeletal: No edema, cyanosis, or clubbing. Neuro: Alert, answering all questions appropriately. Cranial nerves grossly intact. Skin: No rashes or petechiae noted. Psych: Normal affect.   LABORATORY DATA:  I have reviewed the data as listed Lab Results  Component Value Date   WBC 7.5 07/05/2023   HGB 9.9 (L) 07/05/2023   HCT 32.2 (L) 07/05/2023   MCV 87.7 07/05/2023   PLT 287 07/05/2023   Recent Labs    06/12/23 1020 06/13/23 0452 06/16/23 0824 06/17/23 0329 06/21/23 0404 07/05/23 0839 07/12/23 1020  NA 133*   < > 133*   < > 132* 130* 134*  K 3.3*   < > 3.5   < > 3.9 4.4 3.7  CL 95*   < > 98   < > 89* 94* 94*  CO2 25   < > 28   < > 33* 25 28  GLUCOSE 144*   < > 152*   < > 102* 114* 130*  BUN 10   < > 10   <  > 19 10 11   CREATININE 0.69   < > 0.55*   < > 0.65 0.68 0.69  CALCIUM 7.6*   < > 7.0*   < > 8.8* 9.3 9.1  GFRNONAA >60   < > >60   < > >60 >60 >60  PROT 7.0  --  6.5  --   --  7.2  --   ALBUMIN 3.6  --  2.9*  --   --  3.9  --   AST 20  --  18  --   --  26  --   ALT 18  --  25  --   --  23  --   ALKPHOS 106  --  87  --   --  63  --   BILITOT 0.7  --  0.3  --   --  0.8  --   BILIDIR  --   --  <0.1  --   --   --   --   IBILI  --   --  NOT CALCULATED  --   --   --   --    < > = values in this interval not displayed.  Iron/TIBC/Ferritin/ %Sat    Component Value Date/Time   IRON 28 (L) 06/08/2023 1104   TIBC 475 (H) 06/08/2023 1104   FERRITIN 147 06/08/2023 1104   IRONPCTSAT 6 (L) 06/08/2023 1104     RADIOGRAPHIC STUDIES: I have personally reviewed  the radiological images as listed and agreed with the findings in the report. CT CHEST WO CONTRAST  Result Date: 06/19/2023 CLINICAL DATA:  Interstitial lung disease. EXAM: CT CHEST WITHOUT CONTRAST TECHNIQUE: Multidetector CT imaging of the chest was performed following the standard protocol without IV contrast. RADIATION DOSE REDUCTION: This exam was performed according to the departmental dose-optimization program which includes automated exposure control, adjustment of the mA and/or kV according to patient size and/or use of iterative reconstruction technique. COMPARISON:  Chest x-ray 06/18/2023.  Chest CT 05/11/2023 FINDINGS: Cardiovascular: The heart size is normal. No substantial pericardial effusion. Coronary artery calcification is evident. Moderate atherosclerotic calcification is noted in the wall of the thoracic aorta. Mediastinum/Nodes: 13 mm short axis precarinal lymph node has decreased from 21 mm in the interval. 11 mm short axis right hilar lymph node has also decreased. No other mediastinal lymphadenopathy evident. The esophagus has normal imaging features. There is no axillary lymphadenopathy. Lungs/Pleura: Advanced changes of  emphysema noted with a large bulla in the right mid lung and adjacent compressive atelectasis. The mass lesion identified previously in the anterior right lung base measures 7.1 x 5.9 cm today compared to 7.7 x 6.5 cm previously. There is some minimal tracking linear gas in the lateral aspect of the mass which may be related to necrosis although it has a branching configuration in some regions suggesting gas within incorporated bronchi. 12 mm right lower lobe nodule on 113/4 was partially obscured by atelectasis on the previous study but is stable comparing back to an exam from 05/10/2023. 18 mm right lower lobe nodule on 120/4 is similar. 14 mm nodule in the posterior left costophrenic sulcus (128/4) has increased from 7 mm on the 05/10/2023 exam. The dependent atelectasis in the right lung base previously has resolved in the interval. Upper Abdomen: Bilateral low-density adrenal nodules are stable with absolute attenuation suggesting adenomas. Musculoskeletal: No worrisome lytic or sclerotic osseous abnormality. Posttraumatic deformity noted right scapular spine. 2 level vertebral augmentation in the thoracic spine. IMPRESSION: 1. Interval decrease in size of the mass lesion in the anterior right lung base with decrease in mediastinal and right hilar lymphadenopathy. 2. Interval increase in size of a 14 mm nodule in the posterior left costophrenic sulcus. 3. Stable right lower lobe pulmonary nodules. 4. Stable bilateral adrenal adenomas. 5. Aortic Atherosclerosis (ICD10-I70.0) and Emphysema (ICD10-J43.9). Electronically Signed   By: Kennith Center M.D.   On: 06/19/2023 16:17   DG Chest Port 1 View  Result Date: 06/18/2023 CLINICAL DATA:  Shortness of breath.  Sepsis. EXAM: PORTABLE CHEST 1 VIEW COMPARISON:  Chest radiograph 06/15/2023 FINDINGS: The cardiomediastinal silhouette is unchanged with normal heart size. A large right lower lung mass and background lung emphysema are again noted. No acute airspace  consolidation, overt pulmonary edema, sizable pleural effusion, or pneumothorax is identified. IMPRESSION: Known right lung mass and emphysema. No evidence of acute airspace disease. Electronically Signed   By: Sebastian Ache M.D.   On: 06/18/2023 14:34   [place] RT LOWER EXTREM LTD SOFT TISSUE NON VASCULAR  Result Date: 06/15/2023 CLINICAL DATA:  Right foot pain, erythema EXAM: ULTRASOUND RIGHT LOWER EXTREMITY LIMITED TECHNIQUE: Ultrasound examination of the lower extremity soft tissues was performed in the area of clinical concern. COMPARISON:  06/15/2023, 05/11/2023 FINDINGS: Sonographic evaluation of the area of erythema and swelling within the dorsal right midfoot was performed. Similar to prior study, there is a heterogeneous hypoechoic area within the subcutaneous tissues of the dorsal right midfoot, measuring 3.1 x  2.8 x 0.7 cm. This likely reflects a complex fluid collection, and could reflect hematoma or abscess. However, the sonographic appearance is nonspecific. Overall, this has decreased in size slightly since prior study. IMPRESSION: 1. Persistent but decreased heterogeneous hypoechoic area within the dorsal soft tissues of the right midfoot, corresponding to the area of erythema and swelling. Given previous history of right foot trauma, this may reflect a resolving subcutaneous hematoma. Abscess cannot be excluded, though sonographic appearance is somewhat atypical. Continued follow-up is recommended. Electronically Signed   By: Sharlet Salina M.D.   On: 06/15/2023 16:09   DG Foot Complete Right  Result Date: 06/15/2023 CLINICAL DATA:  Right foot pain, dorsal erythema EXAM: RIGHT FOOT COMPLETE - 3+ VIEW COMPARISON:  05/11/2023 FINDINGS: Frontal, oblique, and lateral views of the right foot are obtained. Diffuse soft tissue swelling of the right foot is again noted, greatest within the dorsal aspect of the midfoot. This is actually slightly decreased since the 05/11/2023 exam. No subcutaneous gas or  radiopaque foreign body. There are no acute or destructive bony abnormalities. Alignment is anatomic. Stable inferior calcaneal spur. IMPRESSION: 1. Persistent but decreasing soft tissue swelling throughout the right foot, greatest within the dorsal midfoot. 2. No acute or destructive bony abnormalities. Electronically Signed   By: Sharlet Salina M.D.   On: 06/15/2023 16:06   DG Chest Port 1 View  Result Date: 06/15/2023 CLINICAL DATA:  Cough, short of breath, history of right middle lobe lung cancer EXAM: PORTABLE CHEST 1 VIEW COMPARISON:  05/11/2023, 06/12/2023 FINDINGS: Single frontal view of the chest demonstrates a stable cardiac silhouette. The large mass at the right lung base is unchanged since prior study. Stable background emphysema. No acute airspace disease, effusion, or pneumothorax. No acute bony abnormalities. IMPRESSION: 1. Stable right middle lobe mass consistent with known lung cancer. 2. Stable background emphysema.  No acute airspace disease. Electronically Signed   By: Sharlet Salina M.D.   On: 06/15/2023 16:05    ASSESSMENT & PLAN:  Austin Patterson 66 y.o. male who returns to clinic for follow up for   Chemotherapy follow up- for stage IV lung cancer with metastases to mediastinum and porta hepatis. Chemotherapy recommended however, d/t poor functional status, dose reduced. Initiated Carboplatin (AUC 4)- paclitaxel (125 mg/m2)- pembrolizumab on 06/01/23 with GCSF support. He is currently s/p cycle 2 on 07/05/23 with GCSF support on 07/07/23. Tolerating well. Reviewed use of dexamethasone for 3 days after chemotherapy.  Chronic respiratory failure, asthma, COPD, Idiopathic pulmonary fibrosis- on 2 L oxygen. Managed by pulmonary. On dupixent. On prednisone 10 mg per pulmonology. Contraindicated with immunotherapy. Reached out to Dr Jayme Cloud who advises this can be tapered. Per patient, he has never started taking prednisone. Discontinued.  Hyponatremia- -Chronic in nature. Improved  today. Monitor. Hydrochlorothiazide discontinued.  Hypertension & CAD- managed by Dr Azucena Cecil. Prefer to minimize hydrochlorothiazide in setting of hyponatremia and chemotherapy. Previously BP was soft and he was symptomatic. Losartan-hydrochlorothiazide was held. Continue metoprolol at current dose. No history of arhythmia. On plavix for CAD post PCI in 2009.  Iron Deficiency anemia- Baseline hemoglobin between 9-10 since February 2024. Ferritin was normal, iron sat decreased at 6% with elevated TIBC 475 in August 2024. He has history of hiatal hernia. He discussed starting IV iron with Dr. Alena Bills and will receive first dose today. Again reviewed possible low risk of anaphylaxis and he agrees to proceed. Plan for venofer with his next chemotherapy.  Constipation- likely secondary to oxycodone use and antiemetics which  he receives as part of his chemotherapy. Decreased intake, low fiber, reduced mobility also likely contributing. Recommended starting senna 2 tablets 1-3 times a day with miralax 1 scoop in 8 oz fluid 1-3 times a day. If no BM in 48 hours, take magnesium citrate or milk of magnesia. Can consider linzess or lactulose if ongoing concerns.  Myalgias and arthralgias - secondary to GCSF administration. Continue claritin and tylenol. Continue oxycodone 5 mg q6h prn though he is currently holding d/t improved pain and constipation.   Port-a-cath: placed for administration of chemotherapy on 05/26/23. Removed on 06/14/23 due to concerns of infection.  Goals of care: treatment given with palliative intent.   Disposition:  Venofer today. Hold Fluids.  RTC as scheduled   All questions were answered. The patient knows to call the clinic with any problems, questions or concerns. No barriers to learning was detected.  Alinda Dooms, NP 07/12/23

## 2023-07-13 ENCOUNTER — Other Ambulatory Visit: Payer: Self-pay | Admitting: Internal Medicine

## 2023-07-13 DIAGNOSIS — Z452 Encounter for adjustment and management of vascular access device: Secondary | ICD-10-CM

## 2023-07-13 DIAGNOSIS — C3491 Malignant neoplasm of unspecified part of right bronchus or lung: Secondary | ICD-10-CM

## 2023-07-19 ENCOUNTER — Telehealth: Payer: Self-pay | Admitting: *Deleted

## 2023-07-19 NOTE — Telephone Encounter (Signed)
Pt had visit with Dr. Tedd Sias last week and it is recommended for him to receive his annual reclast infusion. Pt wanted to check with Dr. Alena Bills to see if he can schedule his infusion since he is receiving chemotherapy.   Please advise.

## 2023-07-19 NOTE — Telephone Encounter (Signed)
Pt has been made aware and states will contact Dr. Pricilla Handler office.

## 2023-07-20 ENCOUNTER — Ambulatory Visit
Admission: RE | Admit: 2023-07-20 | Discharge: 2023-07-20 | Disposition: A | Discharge status: Home / Self Care | Payer: Medicare HMO | Source: Ambulatory Visit | Attending: Internal Medicine | Admitting: Internal Medicine

## 2023-07-20 ENCOUNTER — Encounter: Payer: Self-pay | Admitting: Internal Medicine

## 2023-07-20 DIAGNOSIS — Z452 Encounter for adjustment and management of vascular access device: Secondary | ICD-10-CM

## 2023-07-20 DIAGNOSIS — C3491 Malignant neoplasm of unspecified part of right bronchus or lung: Secondary | ICD-10-CM

## 2023-07-20 HISTORY — PX: IR RADIOLOGIST EVAL & MGMT: IMG5224

## 2023-07-20 NOTE — Telephone Encounter (Signed)
Clinical notes faxed to Dr. Pricilla Handler clinic.

## 2023-07-20 NOTE — Progress Notes (Signed)
IR brief note   I saw Austin Patterson today in IR clinic for assessment prior to repeat port placement. The patient had a right chest port placed on May 26, 2023, and presented to the emergency room on June 12, 2023 with concern for port site infection and sepsis.  The port was subsequently removed by general surgery on June 14, 2023.  Personal review of the chart demonstrated that both blood cultures and wound cultures were negative for any microorganisms.  Patient reports that the chest wound was left open, and was healing by secondary intent with a home health nurse packing the wound.    On today, he reports no systemic symptoms of infection.  He denies fevers or chills.  Physical examination of the right chest wound demonstrates near-complete re-epithelialization of the chest wound.  There is no erythema, fluctuance or tenderness.    The patient is interested in having another chest port placed for continuing therapy.  Can plan for likely left-sided chest port placement at [facility] next week.    Austin Bass, MD  Vascular and Interventional Radiology 07/20/2023 10:38 AM

## 2023-07-21 ENCOUNTER — Encounter: Payer: Self-pay | Admitting: *Deleted

## 2023-07-23 MED FILL — Dexamethasone Sodium Phosphate Inj 100 MG/10ML: INTRAMUSCULAR | Qty: 1 | Status: AC

## 2023-07-23 MED FILL — Fosaprepitant Dimeglumine For IV Infusion 150 MG (Base Eq): INTRAVENOUS | Qty: 5 | Status: AC

## 2023-07-26 ENCOUNTER — Inpatient Hospital Stay: Payer: Medicare HMO

## 2023-07-26 ENCOUNTER — Inpatient Hospital Stay (HOSPITAL_BASED_OUTPATIENT_CLINIC_OR_DEPARTMENT_OTHER): Payer: Medicare HMO | Admitting: Internal Medicine

## 2023-07-26 ENCOUNTER — Inpatient Hospital Stay: Payer: Medicare HMO | Attending: Internal Medicine

## 2023-07-26 ENCOUNTER — Ambulatory Visit: Payer: Medicare HMO | Admitting: Pulmonary Disease

## 2023-07-26 VITALS — BP 144/64 | HR 82 | Temp 98.3°F | Wt 190.0 lb

## 2023-07-26 DIAGNOSIS — D649 Anemia, unspecified: Secondary | ICD-10-CM | POA: Diagnosis not present

## 2023-07-26 DIAGNOSIS — Z7952 Long term (current) use of systemic steroids: Secondary | ICD-10-CM | POA: Insufficient documentation

## 2023-07-26 DIAGNOSIS — Z7951 Long term (current) use of inhaled steroids: Secondary | ICD-10-CM | POA: Diagnosis not present

## 2023-07-26 DIAGNOSIS — G893 Neoplasm related pain (acute) (chronic): Secondary | ICD-10-CM | POA: Diagnosis not present

## 2023-07-26 DIAGNOSIS — E871 Hypo-osmolality and hyponatremia: Secondary | ICD-10-CM | POA: Insufficient documentation

## 2023-07-26 DIAGNOSIS — Z5111 Encounter for antineoplastic chemotherapy: Secondary | ICD-10-CM | POA: Diagnosis present

## 2023-07-26 DIAGNOSIS — Z87891 Personal history of nicotine dependence: Secondary | ICD-10-CM | POA: Diagnosis not present

## 2023-07-26 DIAGNOSIS — Z7982 Long term (current) use of aspirin: Secondary | ICD-10-CM | POA: Diagnosis not present

## 2023-07-26 DIAGNOSIS — Z5189 Encounter for other specified aftercare: Secondary | ICD-10-CM | POA: Diagnosis not present

## 2023-07-26 DIAGNOSIS — Z79899 Other long term (current) drug therapy: Secondary | ICD-10-CM | POA: Insufficient documentation

## 2023-07-26 DIAGNOSIS — D509 Iron deficiency anemia, unspecified: Secondary | ICD-10-CM

## 2023-07-26 DIAGNOSIS — Z5112 Encounter for antineoplastic immunotherapy: Secondary | ICD-10-CM | POA: Diagnosis present

## 2023-07-26 DIAGNOSIS — K5900 Constipation, unspecified: Secondary | ICD-10-CM | POA: Diagnosis not present

## 2023-07-26 DIAGNOSIS — C3491 Malignant neoplasm of unspecified part of right bronchus or lung: Secondary | ICD-10-CM | POA: Diagnosis not present

## 2023-07-26 DIAGNOSIS — C342 Malignant neoplasm of middle lobe, bronchus or lung: Secondary | ICD-10-CM | POA: Insufficient documentation

## 2023-07-26 DIAGNOSIS — Z7902 Long term (current) use of antithrombotics/antiplatelets: Secondary | ICD-10-CM | POA: Diagnosis not present

## 2023-07-26 LAB — CBC WITH DIFFERENTIAL (CANCER CENTER ONLY)
Abs Immature Granulocytes: 0.08 10*3/uL — ABNORMAL HIGH (ref 0.00–0.07)
Basophils Absolute: 0.1 10*3/uL (ref 0.0–0.1)
Basophils Relative: 1 %
Eosinophils Absolute: 0 10*3/uL (ref 0.0–0.5)
Eosinophils Relative: 0 %
HCT: 32.4 % — ABNORMAL LOW (ref 39.0–52.0)
Hemoglobin: 9.9 g/dL — ABNORMAL LOW (ref 13.0–17.0)
Immature Granulocytes: 1 %
Lymphocytes Relative: 14 %
Lymphs Abs: 1.4 10*3/uL (ref 0.7–4.0)
MCH: 27.7 pg (ref 26.0–34.0)
MCHC: 30.6 g/dL (ref 30.0–36.0)
MCV: 90.5 fL (ref 80.0–100.0)
Monocytes Absolute: 0.8 10*3/uL (ref 0.1–1.0)
Monocytes Relative: 9 %
Neutro Abs: 7.5 10*3/uL (ref 1.7–7.7)
Neutrophils Relative %: 75 %
Platelet Count: 364 10*3/uL (ref 150–400)
RBC: 3.58 MIL/uL — ABNORMAL LOW (ref 4.22–5.81)
RDW: 19.6 % — ABNORMAL HIGH (ref 11.5–15.5)
WBC Count: 9.9 10*3/uL (ref 4.0–10.5)
nRBC: 0 % (ref 0.0–0.2)

## 2023-07-26 LAB — CMP (CANCER CENTER ONLY)
ALT: 17 U/L (ref 0–44)
AST: 22 U/L (ref 15–41)
Albumin: 4 g/dL (ref 3.5–5.0)
Alkaline Phosphatase: 84 U/L (ref 38–126)
Anion gap: 11 (ref 5–15)
BUN: 11 mg/dL (ref 8–23)
CO2: 27 mmol/L (ref 22–32)
Calcium: 8.7 mg/dL — ABNORMAL LOW (ref 8.9–10.3)
Chloride: 94 mmol/L — ABNORMAL LOW (ref 98–111)
Creatinine: 0.75 mg/dL (ref 0.61–1.24)
GFR, Estimated: >60 mL/min (ref >60)
Glucose, Bld: 149 mg/dL — ABNORMAL HIGH (ref 70–99)
Potassium: 3.9 mmol/L (ref 3.5–5.1)
Sodium: 132 mmol/L — ABNORMAL LOW (ref 135–145)
Total Bilirubin: 0.5 mg/dL (ref 0.3–1.2)
Total Protein: 7.3 g/dL (ref 6.5–8.1)

## 2023-07-26 LAB — TSH: TSH: 0.797 u[IU]/mL (ref 0.350–4.500)

## 2023-07-26 MED ORDER — SODIUM CHLORIDE 0.9 % IV SOLN
Freq: Once | INTRAVENOUS | Status: AC
  Filled 2023-07-26: qty 250

## 2023-07-26 MED ORDER — SODIUM CHLORIDE 0.9 % IV SOLN
200.0000 mg | Freq: Once | INTRAVENOUS | Status: AC
  Administered 2023-07-26: 200 mg via INTRAVENOUS
  Filled 2023-07-26: qty 200

## 2023-07-26 MED ORDER — SODIUM CHLORIDE 0.9 % IV SOLN
10.0000 mg | Freq: Once | INTRAVENOUS | Status: AC
  Administered 2023-07-26: 10 mg via INTRAVENOUS
  Filled 2023-07-26: qty 10

## 2023-07-26 MED ORDER — SODIUM CHLORIDE 0.9 % IV SOLN
473.6000 mg | Freq: Once | INTRAVENOUS | Status: AC
  Administered 2023-07-26: 470 mg via INTRAVENOUS
  Filled 2023-07-26: qty 47

## 2023-07-26 MED ORDER — SODIUM CHLORIDE 0.9 % IV SOLN
150.0000 mg | Freq: Once | INTRAVENOUS | Status: AC
  Administered 2023-07-26: 150 mg via INTRAVENOUS
  Filled 2023-07-26: qty 150

## 2023-07-26 MED ORDER — FAMOTIDINE IN NACL 20-0.9 MG/50ML-% IV SOLN
20.0000 mg | Freq: Once | INTRAVENOUS | Status: AC
  Administered 2023-07-26: 20 mg via INTRAVENOUS
  Filled 2023-07-26: qty 50

## 2023-07-26 MED ORDER — PALONOSETRON HCL INJECTION 0.25 MG/5ML
0.2500 mg | Freq: Once | INTRAVENOUS | Status: AC
  Administered 2023-07-26: 0.25 mg via INTRAVENOUS
  Filled 2023-07-26: qty 5

## 2023-07-26 MED ORDER — SODIUM CHLORIDE 0.9 % IV SOLN
125.0000 mg/m2 | Freq: Once | INTRAVENOUS | Status: AC
  Administered 2023-07-26: 258 mg via INTRAVENOUS
  Filled 2023-07-26: qty 43

## 2023-07-26 MED ORDER — DIPHENHYDRAMINE HCL 50 MG/ML IJ SOLN
50.0000 mg | Freq: Once | INTRAMUSCULAR | Status: AC
  Administered 2023-07-26: 50 mg via INTRAVENOUS
  Filled 2023-07-26: qty 1

## 2023-07-26 NOTE — Progress Notes (Signed)
[facility] CONSULT NOTE  Patient Care Team: Pcp, No as PCP - General Debbe Odea, MD as PCP - Cardiology (Cardiology) Salena Saner, MD as Consulting Physician (Pulmonary Disease) Glory Buff, RN as Oncology Nurse Navigator Michaelyn Barter, MD as Consulting Physician (Oncology)   CANCER STAGING   Cancer Staging  Stage IV adenocarcinoma of lung [facility]) Staging form: Lung, AJCC 8th Edition - Clinical: Stage IV (cT4, cN3, cM1) - Signed by Michaelyn Barter, MD on 05/20/2023 Stage prefix: Initial diagnosis   ASSESSMENT & PLAN:  Austin Patterson 67 y.o. 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  Pathology showed poorly differentiated carcinoma with spindle cell component.  Tumor cells positive for CK and focal TTF-1.  The differential diagnosis includes pleomorphic carcinoma and carcinosarcoma.  Definitive classification cannot be performed on the biopsy specimen.  - PET CT scan from 04/19/2023 showed 7.1 cm right lung mass with SUV of 17.9, bilateral mediastinal and hilar hypermetabolic nodes, hypermetabolic porta hepatis node 1.4 cm SUV 7.2, right lower lobe hypermetabolic pulmonary nodule favoring synchronous primary. Postbiopsy had COPD exacerbation, patient had to be hospitalized.  Repeat CT from 7/22 did show new 0.5 cm left lower lobe nodule.  Also increase in the size of dominant mass from  7.1 x 5.5 to 7.7 x 6 cm.  Also increase in the size of 2 RLL nodules.  MRI brain with and without contrast was negative for metastatic disease.  -Tempus testing showed PD-L1 0% with no targetable mutations.  -Admitted from 06/12/2023 to 06/22/2023 for acute on chronic respiratory failure and sepsis secondary to port infection.  He has completed antibiotics.  - Labs reviewed and acceptable for treatment.  Will proceed with cycle 3 of carboplatin AUC 4 and Taxol 125 mg/m with Keytruda.  G-CSF injection on 10/9.  Causes bone aches.  Discussed about continuing with Claritin for 5 days, Tylenol, and oxycodone as needed.  Overall, patient is doing well with the current treatment regimen.  I discussed about increasing the dose of Taxol slightly to 150.  Patient is hesitant and declined.  He is doing well with the current chemo and he does not want to rock the boat.  Plan to repeat CT scan after total 4 cycles.  # Chronic respiratory failure on 2 L oxygen # Asthma and COPD overlap # Idiopathic pulmonary fibrosis -Management per pulmonary.  On Dupixent  # Hyponatremia -Chronic in nature.  Sodium 129 at baseline.  # Normocytic anemia -Iron panel consistent with iron deficiency.  He is taking p.o. iron twice a day causing constipation.  Advised him to go down to 3 times a week. -Proceed with IV Venofer 200 mg.  Next dose in 3 weeks with cycle #4  # Cancer related pain -In mid chest.  Continue with oxycodone 5 mg Q8 as needed.  Refill today.  # IV access-port removed on 8/26 due to sepsis.   -Rescheduled for port placement on October 11.  Orders Placed This Encounter  Procedures  CBC with Differential (Cancer Center Only)    Standing Status:   Future    Standing Expiration Date:   08/15/2024   CMP (Cancer Center only)    Standing Status:   Future    Standing Expiration Date:   08/15/2024   RTC in 3 weeks for MD visit, labs, cycle 4 carbo Taxol and [place].  The total time spent in the  appointment was 30 minutes encounter with patients including review of chart and various tests results, discussions about plan of care and coordination of care plan   All questions were answered. The patient knows to call the clinic with any problems, questions or concerns. No barriers to learning was detected.  Michaelyn Barter, MD 10/7/202410:18 AM   HISTORY OF PRESENTING ILLNESS:  Austin Patterson 67 y.o. 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  CTA chest showed new 6.8 x 4.9 cm soft tissue mass in the anterior right midlung field.  10 mm nodule in the right lower lung field.  1.8 cm nodule in the right lower lung field.  Large bulla and emphysema present.  Enlarged lymph nodes in the mediastinum measuring 2.1 x 1.9 cm right hilum 2.8 x 1.8 cm.  2.1 cm nodule in right adrenal.  1.8 cm nodule in the left adrenal suggestive of adenomas.  No evidence of PE.    Patient was seen today in the clinic for further evaluation.  He is a remote smoker quit in 2016.  Prior smoked 2 packs a day since age 89.  He is on 2 L oxygen since 2017.  Lives alone.  Was in a wheelchair.  Does not have family close by.  Follows with Dr. Jayme Cloud.   Interval history Patient was seen today for follow-up for stage IV lung adenocarcinoma and cycle 3 of CarboTaxol and Keytruda. Tolerating treatment well.  Denies any nausea or vomiting.  Appetite is good.  His breathing fluctuates.  Yet there are good days and bad days.  Scheduled for another for port placement on October 11.  Denies any neuropathy.  I have reviewed his  chart and materials related to his cancer extensively and collaborated history with the patient. Summary of oncologic history is as follows: His breathing is stable Oncology History  Stage IV adenocarcinoma of lung (HCC)  05/18/2023 Initial Diagnosis   Sarcomatoid carcinoma of lung (HCC)   05/20/2023 Cancer Staging   Staging form: Lung, AJCC 8th Edition - Clinical: Stage IV (cT4, cN3, cM1) - Signed by Michaelyn Barter, MD on 05/20/2023 Stage prefix: Initial diagnosis   06/01/2023 -  Chemotherapy   Patient is on Treatment Plan : LUNG NSCLC Carboplatin (6) + Paclitaxel (200) + Pembrolizumab (200) D1 q21d x 4 cycles / Pembrolizumab (200) Maintenance D1 q21d       MEDICAL HISTORY:  Past Medical History:  Diagnosis Date   Abdominal pain 12/07/2022   Acute urinary retention 12/07/2022   Chest pain 01/24/2022   COPD (chronic obstructive pulmonary disease) (HCC)    GERD (gastroesophageal reflux disease)    Hemothorax on right 05/11/2023   Hyperlipidemia    Hypertension    Pleuritic pain 05/10/2023   Pneumothorax after biopsy  05/11/2023   Post procedure discomfort 05/13/2023   Pulmonary fibrosis (HCC) 11/2015    SURGICAL HISTORY: Past Surgical History:  Procedure Laterality Date   COLONOSCOPY     CORONARY STENT PLACEMENT     ESOPHAGOGASTRODUODENOSCOPY (EGD) WITH PROPOFOL N/A 09/23/2016   Procedure: ESOPHAGOGASTRODUODENOSCOPY (EGD) WITH PROPOFOL;  Surgeon: Wyline Mood, MD;  Location: [facility] ENDOSCOPY;  Service: Endoscopy;  Laterality: N/A;   IR IMAGING GUIDED PORT INSERTION  05/26/2023   IR RADIOLOGIST EVAL & MGMT  07/20/2023   KYPHOPLASTY N/A 03/14/2020   Procedure: T7 & T11 KYPHOPLASTY;  Surgeon: Kennedy Bucker, MD;  Location: [facility] ORS;  Service: Orthopedics;  Laterality: N/A;   PORT-A-CATH REMOVAL N/A 06/14/2023   Procedure: REMOVAL PORT-A-CATH;  Surgeon: Sung Amabile, DO;  Location: [facility] ORS;  Service: General;  Laterality: N/A;   SHOULDER ACROMIOPLASTY      SOCIAL HISTORY: Social History    Socioeconomic History   Marital status: Legally Separated    Spouse name: Not on file   Number of children: Not on file   Years of education: Not on file   Highest education level: Not on file  Occupational History   Not on file  Tobacco Use   Smoking status: Former    Current packs/day: 0.00    Average packs/day: 2.0 packs/day for 50.0 years (100.0 ttl pk-yrs)    Types: Cigarettes    Start date: 10/13/1965    Quit date: 10/14/2015    Years since quitting: 7.7   Smokeless tobacco: Former  Building services engineer status: Never Used  Substance and Sexual Activity   Alcohol use: Yes    Alcohol/week: 56.0 standard drinks of alcohol    Types: 56 Cans of beer per week    Comment: "I sit around and drink beer, that's all I got to do"   Drug use: No   Sexual activity: Not Currently  Other Topics Concern   Not on file  Social History Narrative   Not on file   Social Determinants of Health   Financial Resource Strain: Not on file  Food Insecurity: No Food Insecurity (06/12/2023)   Hunger Vital Sign    Worried About Running Out of Food in the Last Year: Never true    Ran Out of Food in the Last Year: Never true  Transportation Needs: No Transportation Needs (06/12/2023)   PRAPARE - Administrator, Civil Service (Medical): No    Lack of Transportation (Non-Medical): No  Physical Activity: Not on file  Stress: Not on file  Social Connections: Not on file  Intimate Partner Violence: Not At Risk (06/12/2023)   Humiliation, Afraid, Rape, and Kick questionnaire    Fear of Current or Ex-Partner: No    Emotionally Abused: No    Physically Abused: No    Sexually Abused: No    FAMILY HISTORY: Family History  Problem Relation Age of Onset   Heart disease Mother     ALLERGIES:  is allergic to amoxicillin and tizanidine.  MEDICATIONS:  Current Outpatient Medications  Medication Sig Dispense Refill   acetaminophen (TYLENOL) 500 MG tablet Take 1,000 mg by mouth every 6  (six) hours as needed for moderate pain or headache.     albuterol (VENTOLIN HFA) 108 (90 Base) MCG/ACT inhaler Inhale 2 puffs into the lungs every 6 (six) hours as needed for wheezing or shortness of breath. 8 g 11   amLODipine (NORVASC) 5 MG tablet Take 1 tablet (5 mg total) by mouth at bedtime. Skip the  dose if systolic BP less than 130 mmHg 30 tablet 5   aspirin EC 81 MG tablet Take 1 tablet (81 mg total) by mouth daily. 90 tablet 3   atorvastatin (LIPITOR) 40 MG tablet Take 1 tablet (40 mg total) by mouth at bedtime. 90 tablet 3   CALCIUM 600/VITAMIN D 600-10 MG-MCG TABS Take 1 tablet by mouth 2 (two) times daily.     clopidogrel (PLAVIX) 75 MG tablet Take 1 tablet (75 mg total) by mouth daily. 90 tablet 3   dexamethasone (DECADRON) 4 MG tablet Take 2 tablets (8mg ) by mouth daily starting the day after carboplatin for 3 days. Take with food 30 tablet 1   Dupilumab (DUPIXENT) 300 MG/2ML SOPN Inject 300 mg into the skin every 14 (fourteen) days. 12 mL 1   EPINEPHrine 0.3 mg/0.3 mL IJ SOAJ injection Inject 0.3 mg into the muscle as needed for anaphylaxis.     ferrous sulfate 325 (65 FE) MG tablet Take 1 tablet (325 mg total) by mouth 2 (two) times daily with a meal. 60 tablet 2   fluticasone-salmeterol (ADVAIR DISKUS) 250-50 MCG/ACT AEPB Inhale 1 puff into the lungs in the morning and at bedtime. 60 each 11   gabapentin (NEURONTIN) 300 MG capsule TAKE 1 CAPSULE BY MOUTH THREE TIMES A DAY 90 capsule 1   guaiFENesin (ROBITUSSIN) 100 MG/5ML liquid Take 5 mLs by mouth every 4 (four) hours as needed for cough or to loosen phlegm. 236 mL 1   guaiFENesin-dextromethorphan (ROBITUSSIN DM) 100-10 MG/5ML syrup Take 5 mLs by mouth every 4 (four) hours as needed for cough. 118 mL 0   ipratropium-albuterol (DUONEB) 0.5-2.5 (3) MG/3ML SOLN INHALE 1 VIAL THROUGH NEBULIZER EVERY 6 HOURS 270 mL 2   lidocaine-prilocaine (EMLA) cream Apply to affected area once 30 g 3   magnesium oxide (MAG-OX) 400 (241.3 Mg) MG  tablet Take 1 tablet (400 mg total) by mouth daily. 30 tablet 0   metoprolol succinate (TOPROL-XL) 100 MG 24 hr tablet Take 1 tablet (100 mg total) by mouth daily. 30 tablet 11   montelukast (SINGULAIR) 10 MG tablet Take 10 mg by mouth daily.     Multiple Vitamin (MULTIVITAMIN WITH MINERALS) TABS tablet Take 1 tablet by mouth daily. 30 tablet 0   ondansetron (ZOFRAN) 8 MG tablet Take 1 tablet (8 mg total) by mouth every 8 (eight) hours as needed for nausea or vomiting. Start on the third day after carboplatin. 30 tablet 1   oxyCODONE (OXY IR/ROXICODONE) 5 MG immediate release tablet Take 1 tablet (5 mg total) by mouth every 8 (eight) hours as needed for up to 90 doses for severe pain. 90 tablet 0   pantoprazole (PROTONIX) 40 MG tablet Take 1 tablet (40 mg total) by mouth 2 (two) times daily. 90 tablet 3   predniSONE (DELTASONE) 10 MG tablet Take 1 tablet (10 mg total) by mouth daily as needed. Take as directed. 30 tablet 1   prochlorperazine (COMPAZINE) 10 MG tablet Take 1 tablet (10 mg total) by mouth every 6 (six) hours as needed for nausea or vomiting. 30 tablet 1   senna-docusate (SENOKOT-S) 8.6-50 MG tablet Take 1 tablet by mouth 2 (two) times daily. 30 tablet 1   Spacer/Aero-Holding Chambers (AEROCHAMBER MV) inhaler Use as instructed 1 each 0   chlorpheniramine-HYDROcodone (TUSSIONEX) 10-8 MG/5ML Take 5 mLs by mouth at bedtime as needed for cough. (Patient not taking: Reported on 06/12/2023) 70 mL 0   No current facility-administered medications for this visit.  Facility-Administered Medications Ordered in Other Visits  Medication Dose Route Frequency Provider Last Rate Last Admin   CARBOplatin (PARAPLATIN) 470 mg in sodium chloride 0.9 % 250 mL chemo infusion  470 mg Intravenous Once Michaelyn Barter, MD       dexamethasone (DECADRON) 10 mg in sodium chloride 0.9 % 50 mL IVPB  10 mg Intravenous Once Michaelyn Barter, MD 204 mL/hr at 07/26/23 1013 10 mg at 07/26/23 1013   diphenhydrAMINE  (BENADRYL) injection 50 mg  50 mg Intravenous Once Michaelyn Barter, MD       famotidine (PEPCID) IVPB 20 mg premix  20 mg Intravenous Once Michaelyn Barter, MD       fosaprepitant (EMEND) 150 mg in sodium chloride 0.9 % 145 mL IVPB  150 mg Intravenous Once Michaelyn Barter, MD       PACLitaxel (TAXOL) 258 mg in sodium chloride 0.9 % 250 mL chemo infusion (> 80mg /m2)  125 mg/m2 (Treatment Plan Recorded) Intravenous Once Michaelyn Barter, MD       palonosetron (ALOXI) injection 0.25 mg  0.25 mg Intravenous Once Michaelyn Barter, MD       pembrolizumab [facility]) 200 mg in sodium chloride 0.9 % 50 mL chemo infusion  200 mg Intravenous Once Michaelyn Barter, MD        REVIEW OF SYSTEMS:   Pertinent information mentioned in HPI All other systems were reviewed with the patient and are negative.  PHYSICAL EXAMINATION: ECOG PERFORMANCE STATUS: 2 - Symptomatic, <50% confined to bed   Vitals:   07/26/23 0905  BP: (!) 144/64  Pulse: 82  Temp: 98.3 F (36.8 C)  SpO2: 98%    Filed Weights   07/26/23 0905  Weight: 190 lb (86.2 kg)     GENERAL:alert, no distress and comfortable SKIN: skin color, texture, turgor are normal, no rashes or significant lesions EYES: normal, conjunctiva are pink and non-injected, sclera clear OROPHARYNX:no exudate, no erythema and lips, buccal mucosa, and tongue normal  NECK: supple, thyroid normal size, non-tender, without nodularity LYMPH:  no palpable lymphadenopathy in the cervical, axillary or inguinal LUNGS: clear to auscultation and percussion with normal breathing effort HEART: regular rate & rhythm and no murmurs and no lower extremity edema ABDOMEN:abdomen soft, non-tender and normal bowel sounds Musculoskeletal:no cyanosis of digits and no clubbing  PSYCH: alert & oriented x 3 with fluent speech NEURO: no focal motor/sensory deficits  LABORATORY DATA:  I have reviewed the data as listed Lab Results  Component Value Date   WBC 9.9 07/26/2023   HGB  9.9 (L) 07/26/2023   HCT 32.4 (L) 07/26/2023   MCV 90.5 07/26/2023   PLT 364 07/26/2023   Recent Labs    06/16/23 0824 06/17/23 0329 07/05/23 0839 07/12/23 1020 07/26/23 0837  NA 133*   < > 130* 134* 132*  K 3.5   < > 4.4 3.7 3.9  CL 98   < > 94* 94* 94*  CO2 28   < > 25 28 27   GLUCOSE 152*   < > 114* 130* 149*  BUN 10   < > 10 11 11   CREATININE 0.55*   < > 0.68 0.69 0.75  CALCIUM 7.0*   < > 9.3 9.1 8.7*  GFRNONAA >60   < > >60 >60 >60  PROT 6.5  --  7.2  --  7.3  ALBUMIN 2.9*  --  3.9  --  4.0  AST 18  --  26  --  22  ALT 25  --  23  --  17  ALKPHOS 87  --  63  --  84  BILITOT 0.3  --  0.8  --  0.5  BILIDIR <0.1  --   --   --   --   IBILI NOT CALCULATED  --   --   --   --    < > = values in this interval not displayed.    RADIOGRAPHIC STUDIES: I have personally reviewed the radiological images as listed and agreed with the findings in the report. IR Radiologist Eval & Mgmt  Result Date: 07/20/2023 EXAM: ESTABLISHED PATIENT OFFICE VISIT CHIEF COMPLAINT: Refer to EMR HISTORY OF PRESENT ILLNESS: The patient had a right chest port placed on May 26, 2023, and presented to the emergency room on June 12, 2023 with concern for port site infection and sepsis. The port was subsequently removed by general surgery on June 14, 2023. Personal review of the chart demonstrated that both blood cultures and wound cultures were negative for any microorganisms. Patient reports that the chest wound was left open, and was healing by secondary intent with a home health nurse packing the wound. On today, he reports no systemic symptoms of infection. He denies fevers or chills. Physical examination of the right chest wound demonstrates near-complete re-epithelialization of the chest wound. There is no erythema, fluctuance or tenderness. The patient is interested in having another chest port placed for continuing therapy. Can plan for likely left-sided chest port placement at [facility] next week. REVIEW  OF SYSTEMS: Refer to EMR PHYSICAL EXAMINATION: Refer to EMR ASSESSMENT AND PLAN: Refer to EMR Electronically Signed   By: Olive Bass M.D.   On: 07/20/2023 10:38

## 2023-07-26 NOTE — Progress Notes (Signed)
Patient says that he is fine with the port appointment which is scheduled for this Friday at 11:30 am.

## 2023-07-27 ENCOUNTER — Telehealth: Payer: Self-pay

## 2023-07-27 LAB — T4: T4, Total: 6.2 ug/dL (ref 4.5–12.0)

## 2023-07-27 NOTE — Telephone Encounter (Signed)
I called and spoke with patient in regards to his Port placement procedure appointment this Friday 07/30/2023 at 11:30, and while on the phone patient asked about his treatment appointment for infusion was scheduled for 2:30 in the afternoon. I made sure that he was going to be able to make it, and then sent scheduling a secure chat informing them of this and to try to keep his appointments for in the morning due to transportation. Also, he asked if he needed to stop his plavix before his procedure Friday and asked Marylu Lund she said no due to him being a low risk patient.

## 2023-07-28 ENCOUNTER — Inpatient Hospital Stay: Payer: Medicare HMO

## 2023-07-28 DIAGNOSIS — C3491 Malignant neoplasm of unspecified part of right bronchus or lung: Secondary | ICD-10-CM

## 2023-07-28 DIAGNOSIS — Z5112 Encounter for antineoplastic immunotherapy: Secondary | ICD-10-CM | POA: Diagnosis not present

## 2023-07-28 MED ORDER — PEGFILGRASTIM-JMDB 6 MG/0.6ML ~~LOC~~ SOSY
6.0000 mg | PREFILLED_SYRINGE | Freq: Once | SUBCUTANEOUS | Status: AC
  Administered 2023-07-28: 6 mg via SUBCUTANEOUS
  Filled 2023-07-28: qty 0.6

## 2023-07-29 ENCOUNTER — Other Ambulatory Visit (HOSPITAL_COMMUNITY): Payer: Self-pay | Admitting: Student

## 2023-07-29 NOTE — Progress Notes (Signed)
Patient for IR Port Insertion on Friday 07/30/2023, I called and LVM for the patient on the phone and gave pre-procedure instructions. VM made pt aware to be here at 11:30a, NPO after MN prior to procedure as well as driver post procedure/recovery/discharge. Called 07/29/23

## 2023-07-30 ENCOUNTER — Ambulatory Visit
Admission: RE | Admit: 2023-07-30 | Discharge: 2023-07-30 | Disposition: A | Discharge status: Home / Self Care | Payer: Medicare HMO | Source: Ambulatory Visit | Attending: Internal Medicine | Admitting: Internal Medicine

## 2023-07-30 ENCOUNTER — Other Ambulatory Visit: Payer: Self-pay

## 2023-07-30 ENCOUNTER — Encounter: Payer: Self-pay | Admitting: Radiology

## 2023-07-30 DIAGNOSIS — E785 Hyperlipidemia, unspecified: Secondary | ICD-10-CM | POA: Diagnosis not present

## 2023-07-30 DIAGNOSIS — I2510 Atherosclerotic heart disease of native coronary artery without angina pectoris: Secondary | ICD-10-CM | POA: Insufficient documentation

## 2023-07-30 DIAGNOSIS — Z955 Presence of coronary angioplasty implant and graft: Secondary | ICD-10-CM | POA: Diagnosis not present

## 2023-07-30 DIAGNOSIS — J4489 Other specified chronic obstructive pulmonary disease: Secondary | ICD-10-CM | POA: Diagnosis not present

## 2023-07-30 DIAGNOSIS — N400 Enlarged prostate without lower urinary tract symptoms: Secondary | ICD-10-CM | POA: Insufficient documentation

## 2023-07-30 DIAGNOSIS — K219 Gastro-esophageal reflux disease without esophagitis: Secondary | ICD-10-CM | POA: Diagnosis not present

## 2023-07-30 DIAGNOSIS — I10 Essential (primary) hypertension: Secondary | ICD-10-CM | POA: Insufficient documentation

## 2023-07-30 DIAGNOSIS — C3491 Malignant neoplasm of unspecified part of right bronchus or lung: Secondary | ICD-10-CM | POA: Insufficient documentation

## 2023-07-30 DIAGNOSIS — Z9221 Personal history of antineoplastic chemotherapy: Secondary | ICD-10-CM | POA: Insufficient documentation

## 2023-07-30 DIAGNOSIS — F1010 Alcohol abuse, uncomplicated: Secondary | ICD-10-CM | POA: Diagnosis not present

## 2023-07-30 DIAGNOSIS — Z87891 Personal history of nicotine dependence: Secondary | ICD-10-CM | POA: Diagnosis not present

## 2023-07-30 DIAGNOSIS — Z452 Encounter for adjustment and management of vascular access device: Secondary | ICD-10-CM

## 2023-07-30 HISTORY — PX: IR IMAGING GUIDED PORT INSERTION: IMG5740

## 2023-07-30 MED ORDER — HEPARIN SOD (PORK) LOCK FLUSH 100 UNIT/ML IV SOLN
500.0000 [IU] | Freq: Once | INTRAVENOUS | Status: AC
  Administered 2023-07-30: 500 [IU] via INTRAVENOUS

## 2023-07-30 MED ORDER — MIDAZOLAM HCL 2 MG/2ML IJ SOLN
INTRAMUSCULAR | Status: AC
  Filled 2023-07-30: qty 4

## 2023-07-30 MED ORDER — SODIUM CHLORIDE 0.9 % IV SOLN: INTRAVENOUS | Status: DC

## 2023-07-30 MED ORDER — LIDOCAINE-EPINEPHRINE 1 %-1:100000 IJ SOLN
INTRAMUSCULAR | Status: AC
  Filled 2023-07-30: qty 1

## 2023-07-30 MED ORDER — MIDAZOLAM HCL 2 MG/2ML IJ SOLN
INTRAMUSCULAR | Status: AC | PRN
Start: 2023-07-30 — End: 2023-07-30
  Administered 2023-07-30 (×2): 1 mg via INTRAVENOUS

## 2023-07-30 MED ORDER — HEPARIN SOD (PORK) LOCK FLUSH 100 UNIT/ML IV SOLN
INTRAVENOUS | Status: AC
  Filled 2023-07-30: qty 5

## 2023-07-30 MED ORDER — FENTANYL CITRATE (PF) 100 MCG/2ML IJ SOLN
INTRAMUSCULAR | Status: AC | PRN
Start: 2023-07-30 — End: 2023-07-30
  Administered 2023-07-30: 25 ug via INTRAVENOUS
  Administered 2023-07-30: 50 ug via INTRAVENOUS

## 2023-07-30 MED ORDER — LIDOCAINE-EPINEPHRINE 1 %-1:100000 IJ SOLN
20.0000 mL | Freq: Once | INTRAMUSCULAR | Status: AC
  Administered 2023-07-30: 12 mL via INTRADERMAL

## 2023-07-30 MED ORDER — FENTANYL CITRATE (PF) 100 MCG/2ML IJ SOLN
INTRAMUSCULAR | Status: AC
  Filled 2023-07-30: qty 2

## 2023-07-30 NOTE — Consult Note (Signed)
Chief Complaint: Patient was seen in consultation today for image guided tunneled central venous catheter placement with port, for chemotherapy treatment access.  Referring Physician(s): Agrawal,Kavita  Supervising Physician: Pernell Dupre  Patient Status: [facility] - Out-pt  History of Present Illness: ARAV Patterson is a 67 y.o. male with a medical history of HTN, ETOH abuse, HLD, CAD s/p stent, GERD, asthma, COPD and BPH. Patient currently receiving chemotherapy for  Stage IV adenocarcinoma of lung. On 06/14/23 IV port removed due to sepsis.  Dr.K.Agrawal consulted IR for new port placement  Past Medical History:  Diagnosis Date   Abdominal pain 12/07/2022   Acute urinary retention 12/07/2022   Chest pain 01/24/2022   COPD (chronic obstructive pulmonary disease) (HCC)    GERD (gastroesophageal reflux disease)    Hemothorax on right 05/11/2023   Hyperlipidemia    Hypertension    Pleuritic pain 05/10/2023   Pneumothorax after biopsy 05/11/2023   Post procedure discomfort 05/13/2023   Pulmonary fibrosis (HCC) 11/2015    Past Surgical History:  Procedure Laterality Date   COLONOSCOPY     CORONARY STENT PLACEMENT     ESOPHAGOGASTRODUODENOSCOPY (EGD) WITH PROPOFOL N/A 09/23/2016   Procedure: ESOPHAGOGASTRODUODENOSCOPY (EGD) WITH PROPOFOL;  Surgeon: Wyline Mood, MD;  Location: [facility] ENDOSCOPY;  Service: Endoscopy;  Laterality: N/A;   IR IMAGING GUIDED PORT INSERTION  05/26/2023   IR RADIOLOGIST EVAL & MGMT  07/20/2023   KYPHOPLASTY N/A 03/14/2020   Procedure: T7 & T11 KYPHOPLASTY;  Surgeon: Kennedy Bucker, MD;  Location: [facility] ORS;  Service: Orthopedics;  Laterality: N/A;   PORT-A-CATH REMOVAL N/A 06/14/2023   Procedure: REMOVAL PORT-A-CATH;  Surgeon: Sung Amabile, DO;  Location: [facility] ORS;  Service: General;  Laterality: N/A;   SHOULDER ACROMIOPLASTY      Allergies: Amoxicillin and Tizanidine  Medications: Prior to Admission medications   Medication Sig Start Date End Date  Taking? Authorizing Provider  acetaminophen (TYLENOL) 500 MG tablet Take 1,000 mg by mouth every 6 (six) hours as needed for moderate pain or headache.    [provider]  albuterol (VENTOLIN HFA) 108 (90 Base) MCG/ACT inhaler Inhale 2 puffs into the lungs every 6 (six) hours as needed for wheezing or shortness of breath. 01/29/20   Danford, Earl Lites, MD  amLODipine (NORVASC) 5 MG tablet Take 1 tablet (5 mg total) by mouth at bedtime. Skip the dose if systolic BP less than 130 mmHg 06/22/23 12/19/23  Gillis Santa, MD  aspirin EC 81 MG tablet Take 1 tablet (81 mg total) by mouth daily. 08/19/16   Virl Axe, MD  atorvastatin (LIPITOR) 40 MG tablet Take 1 tablet (40 mg total) by mouth at bedtime. 08/19/16   Virl Axe, MD  CALCIUM 600/VITAMIN D 600-10 MG-MCG TABS Take 1 tablet by mouth 2 (two) times daily. 11/20/21   [provider]  chlorpheniramine-HYDROcodone (TUSSIONEX) 10-8 MG/5ML Take 5 mLs by mouth at bedtime as needed for cough. Patient not taking: Reported on 06/12/2023 04/05/23   Sunnie Nielsen, DO  clopidogrel (PLAVIX) 75 MG tablet Take 1 tablet (75 mg total) by mouth daily. 08/19/16   Virl Axe, MD  dexamethasone (DECADRON) 4 MG tablet Take 2 tablets (8mg ) by mouth daily starting the day after carboplatin for 3 days. Take with food 05/28/23   Michaelyn Barter, MD  Dupilumab (DUPIXENT) 300 MG/2ML SOPN Inject 300 mg into the skin every 14 (fourteen) days. 11/04/22   Salena Saner, MD  EPINEPHrine 0.3 mg/0.3 mL IJ SOAJ injection Inject 0.3  mg into the muscle as needed for anaphylaxis.    [provider]  ferrous sulfate 325 (65 FE) MG tablet Take 1 tablet (325 mg total) by mouth 2 (two) times daily with a meal. 06/22/23 09/20/23  Gillis Santa, MD  fluticasone-salmeterol (ADVAIR DISKUS) 250-50 MCG/ACT AEPB Inhale 1 puff into the lungs in the morning and at bedtime. 03/04/22   Salena Saner, MD  gabapentin (NEURONTIN) 300 MG capsule TAKE 1 CAPSULE BY MOUTH  THREE TIMES A DAY 07/07/23   Salena Saner, MD  guaiFENesin (ROBITUSSIN) 100 MG/5ML liquid Take 5 mLs by mouth every 4 (four) hours as needed for cough or to loosen phlegm. 07/05/23   Michaelyn Barter, MD  guaiFENesin-dextromethorphan (ROBITUSSIN DM) 100-10 MG/5ML syrup Take 5 mLs by mouth every 4 (four) hours as needed for cough. 06/01/23   Michaelyn Barter, MD  ipratropium-albuterol (DUONEB) 0.5-2.5 (3) MG/3ML SOLN INHALE 1 VIAL THROUGH NEBULIZER EVERY 6 HOURS 10/01/22   Salena Saner, MD  lidocaine-prilocaine (EMLA) cream Apply to affected area once 05/28/23   Michaelyn Barter, MD  magnesium oxide (MAG-OX) 400 (241.3 Mg) MG tablet Take 1 tablet (400 mg total) by mouth daily. 06/18/17   Milagros Loll, MD  metoprolol succinate (TOPROL-XL) 100 MG 24 hr tablet Take 1 tablet (100 mg total) by mouth daily. 06/22/23 06/21/24  Gillis Santa, MD  montelukast (SINGULAIR) 10 MG tablet Take 10 mg by mouth daily. 11/19/21   [provider]  Multiple Vitamin (MULTIVITAMIN WITH MINERALS) TABS tablet Take 1 tablet by mouth daily. 09/24/16   Enedina Finner, MD  ondansetron (ZOFRAN) 8 MG tablet Take 1 tablet (8 mg total) by mouth every 8 (eight) hours as needed for nausea or vomiting. Start on the third day after carboplatin. 05/28/23   Michaelyn Barter, MD  oxyCODONE (OXY IR/ROXICODONE) 5 MG immediate release tablet Take 1 tablet (5 mg total) by mouth every 8 (eight) hours as needed for up to 90 doses for severe pain. 07/05/23   Michaelyn Barter, MD  pantoprazole (PROTONIX) 40 MG tablet Take 1 tablet (40 mg total) by mouth 2 (two) times daily. 09/23/16   Enedina Finner, MD  predniSONE (DELTASONE) 10 MG tablet Take 1 tablet (10 mg total) by mouth daily as needed. Take as directed. 04/27/23   Salena Saner, MD  prochlorperazine (COMPAZINE) 10 MG tablet Take 1 tablet (10 mg total) by mouth every 6 (six) hours as needed for nausea or vomiting. 05/28/23   Michaelyn Barter, MD  senna-docusate (SENOKOT-S) 8.6-50 MG tablet Take 1  tablet by mouth 2 (two) times daily. 12/09/22   Pennie Banter, DO  Spacer/Aero-Holding Chambers (AEROCHAMBER MV) inhaler Use as instructed 10/05/19   Salena Saner, MD     Family History  Problem Relation Age of Onset   Heart disease Mother     Social History   Socioeconomic History   Marital status: Legally Separated    Spouse name: Not on file   Number of children: Not on file   Years of education: Not on file   Highest education level: Not on file  Occupational History   Not on file  Tobacco Use   Smoking status: Former    Current packs/day: 0.00    Average packs/day: 2.0 packs/day for 50.0 years (100.0 ttl pk-yrs)    Types: Cigarettes    Start date: 10/13/1965    Quit date: 10/14/2015    Years since quitting: 7.7   Smokeless tobacco: Former  Building services engineer status:  Never Used  Substance and Sexual Activity   Alcohol use: Yes    Alcohol/week: 56.0 standard drinks of alcohol    Types: 56 Cans of beer per week    Comment: "I sit around and drink beer, that's all I got to do"   Drug use: No   Sexual activity: Not Currently  Other Topics Concern   Not on file  Social History Narrative   Not on file   Social Determinants of Health   Financial Resource Strain: Not on file  Food Insecurity: No Food Insecurity (06/12/2023)   Hunger Vital Sign    Worried About Running Out of Food in the Last Year: Never true    Ran Out of Food in the Last Year: Never true  Transportation Needs: No Transportation Needs (06/12/2023)   PRAPARE - Administrator, Civil Service (Medical): No    Lack of Transportation (Non-Medical): No  Physical Activity: Not on file  Stress: Not on file  Social Connections: Not on file    Review of Systems: A 12 point ROS discussed and pertinent positives are indicated in the HPI above.  All other systems are negative.  Review of Systems  Constitutional:  Negative for activity change, chills and fever.  Respiratory:  Negative for  cough, chest tightness and shortness of breath.   Cardiovascular:  Negative for chest pain and palpitations.  Gastrointestinal:  Negative for abdominal distention and abdominal pain.  Skin:  Negative for color change.  Neurological:  Negative for headaches.  Psychiatric/Behavioral:  Negative for behavioral problems and confusion.     Vital Signs: There were no vitals taken for this visit.  Advance Care Plan:    Physical Exam Constitutional:      Appearance: He is obese.  HENT:     Mouth/Throat:     Mouth: Mucous membranes are moist.  Cardiovascular:     Rate and Rhythm: Normal rate and regular rhythm.     Pulses: Normal pulses.     Heart sounds: Normal heart sounds.  Pulmonary:     Effort: Pulmonary effort is normal.     Breath sounds: Normal breath sounds. No wheezing.  Abdominal:     Palpations: Abdomen is soft.  Skin:    General: Skin is warm and dry.  Neurological:     Mental Status: He is alert and oriented to person, place, and time.  Psychiatric:        Mood and Affect: Mood normal.        Behavior: Behavior normal.     Imaging: IR Radiologist Eval & Mgmt  Result Date: 07/20/2023 EXAM: ESTABLISHED PATIENT OFFICE VISIT CHIEF COMPLAINT: Refer to EMR HISTORY OF PRESENT ILLNESS: The patient had a right chest port placed on May 26, 2023, and presented to the emergency room on June 12, 2023 with concern for port site infection and sepsis. The port was subsequently removed by general surgery on June 14, 2023. Personal review of the chart demonstrated that both blood cultures and wound cultures were negative for any microorganisms. Patient reports that the chest wound was left open, and was healing by secondary intent with a home health nurse packing the wound. On today, he reports no systemic symptoms of infection. He denies fevers or chills. Physical examination of the right chest wound demonstrates near-complete re-epithelialization of the chest wound. There is no  erythema, fluctuance or tenderness. The patient is interested in having another chest port placed for continuing therapy. Can plan for likely left-sided chest  port placement at [facility] next week. REVIEW OF SYSTEMS: Refer to EMR PHYSICAL EXAMINATION: Refer to EMR ASSESSMENT AND PLAN: Refer to EMR Electronically Signed   By: Olive Bass M.D.   On: 07/20/2023 10:38    .[name] Vitals:   07/30/23 1153  BP: (!) 160/86  Pulse: 91  Resp: 20  Temp: 97.6 F (36.4 C)  SpO2: 95%    Labs:  CBC: Recent Labs    06/21/23 0404 07/05/23 0840 07/12/23 1019 07/26/23 0837  WBC 14.6* 7.5 11.4* 9.9  HGB 8.9* 9.9* 9.5* 9.9*  HCT 28.4* 32.2* 31.1* 32.4*  PLT 862* 287 256 364    COAGS: Recent Labs    05/10/23 0922 06/12/23 1020  INR 1.1 1.2  APTT  --  40*    BMP: Recent Labs    06/21/23 0404 07/05/23 0839 07/12/23 1020 07/26/23 0837  NA 132* 130* 134* 132*  K 3.9 4.4 3.7 3.9  CL 89* 94* 94* 94*  CO2 33* 25 28 27   GLUCOSE 102* 114* 130* 149*  BUN 19 10 11 11   CALCIUM 8.8* 9.3 9.1 8.7*  CREATININE 0.65 0.68 0.69 0.75  GFRNONAA >60 >60 >60 >60    LIVER FUNCTION TESTS: Recent Labs    06/12/23 1020 06/16/23 0824 07/05/23 0839 07/26/23 0837  BILITOT 0.7 0.3 0.8 0.5  AST 20 18 26 22   ALT 18 25 23 17   ALKPHOS 106 87 63 84  PROT 7.0 6.5 7.2 7.3  ALBUMIN 3.6 2.9* 3.9 4.0    TUMOR MARKERS: No results for input(s): "AFPTM", "CEA", "CA199", "CHROMGRNA" in the last 8760 hours.  Assessment and Plan:  Austin Patterson is a 67 y.o. male with a medical history of HTN, ETOH abuse, HLD, CAD s/p stent, GERD, asthma, COPD and BPH. Patient currently receiving chemotherapy for  Stage IV adenocarcinoma of lung. On 06/14/23 IV port removed due to sepsis.   Patient current NPO status, did not take plavix today, Vital signs stable  Patient in IR today for new port placement per request of Dr.K.Agrawal  Images reviewed by Dr. Durward Parcel and approved for port placement in IR on 07/30/23.   Risks  and benefits of image guided port-a-catheter placement was discussed with the patient including, but not limited to bleeding, infection, pneumothorax, or fibrin sheath development and need for additional procedures.  All of the patient's questions were answered, patient is agreeable to proceed. Consent signed and in chart.Risks and benefits of image guided port-a-catheter placement was discussed with the patient including, but not limited to bleeding, infection, pneumothorax, or fibrin sheath development and need for additional procedures.  All of the patient's questions were answered, patient is agreeable to proceed. Consent signed and in chart.  Thank you for this interesting consult.  I greatly enjoyed meeting KEENAN TREFRY and look forward to participating in their care.  A copy of this report was sent to the requesting provider on this date.  Electronically Signed: Ardith Dark, NP 07/30/2023, 10:28 AM   I spent a total of    15 Minutes in face to face in clinical consultation, greater than 50% of which was counseling/coordinating care for image guided tunneled central venous catheter placement with port.

## 2023-07-30 NOTE — Procedures (Signed)
Interventional Radiology Procedure Note  Date of Procedure: 07/30/2023  Procedure: Port placement   Findings:  1. Left chest port placement    Complications: No immediate complications noted.   Estimated Blood Loss: minimal  Follow-up and Recommendations: 1. Tip at CA junction. Ready for use.    Olive Bass, MD  Vascular & Interventional Radiology  07/30/2023 1:32 PM

## 2023-08-04 ENCOUNTER — Encounter: Payer: Self-pay | Admitting: *Deleted

## 2023-08-06 ENCOUNTER — Emergency Department: Payer: Medicare HMO

## 2023-08-06 ENCOUNTER — Other Ambulatory Visit: Payer: Self-pay

## 2023-08-06 ENCOUNTER — Observation Stay
Admission: EM | Admit: 2023-08-06 | Discharge: 2023-08-08 | Disposition: A | Discharge status: Home Health | Payer: Medicare HMO | Attending: Internal Medicine | Admitting: Internal Medicine

## 2023-08-06 DIAGNOSIS — R1084 Generalized abdominal pain: Secondary | ICD-10-CM | POA: Diagnosis not present

## 2023-08-06 DIAGNOSIS — E871 Hypo-osmolality and hyponatremia: Secondary | ICD-10-CM | POA: Diagnosis not present

## 2023-08-06 DIAGNOSIS — I2510 Atherosclerotic heart disease of native coronary artery without angina pectoris: Secondary | ICD-10-CM | POA: Insufficient documentation

## 2023-08-06 DIAGNOSIS — R531 Weakness: Secondary | ICD-10-CM | POA: Diagnosis present

## 2023-08-06 DIAGNOSIS — K529 Noninfective gastroenteritis and colitis, unspecified: Secondary | ICD-10-CM | POA: Diagnosis not present

## 2023-08-06 DIAGNOSIS — R000 Tachycardia, unspecified: Secondary | ICD-10-CM

## 2023-08-06 DIAGNOSIS — D509 Iron deficiency anemia, unspecified: Secondary | ICD-10-CM | POA: Diagnosis not present

## 2023-08-06 DIAGNOSIS — G8929 Other chronic pain: Secondary | ICD-10-CM | POA: Insufficient documentation

## 2023-08-06 DIAGNOSIS — J8410 Pulmonary fibrosis, unspecified: Secondary | ICD-10-CM | POA: Diagnosis present

## 2023-08-06 DIAGNOSIS — G629 Polyneuropathy, unspecified: Secondary | ICD-10-CM | POA: Diagnosis not present

## 2023-08-06 DIAGNOSIS — R6510 Systemic inflammatory response syndrome (SIRS) of non-infectious origin without acute organ dysfunction: Secondary | ICD-10-CM | POA: Diagnosis present

## 2023-08-06 DIAGNOSIS — K5730 Diverticulosis of large intestine without perforation or abscess without bleeding: Secondary | ICD-10-CM | POA: Diagnosis not present

## 2023-08-06 DIAGNOSIS — Z7902 Long term (current) use of antithrombotics/antiplatelets: Secondary | ICD-10-CM | POA: Diagnosis not present

## 2023-08-06 DIAGNOSIS — Z7982 Long term (current) use of aspirin: Secondary | ICD-10-CM | POA: Diagnosis not present

## 2023-08-06 DIAGNOSIS — I480 Paroxysmal atrial fibrillation: Secondary | ICD-10-CM | POA: Diagnosis not present

## 2023-08-06 DIAGNOSIS — E876 Hypokalemia: Secondary | ICD-10-CM | POA: Insufficient documentation

## 2023-08-06 DIAGNOSIS — E663 Overweight: Secondary | ICD-10-CM | POA: Insufficient documentation

## 2023-08-06 DIAGNOSIS — I110 Hypertensive heart disease with heart failure: Secondary | ICD-10-CM | POA: Diagnosis not present

## 2023-08-06 DIAGNOSIS — I509 Heart failure, unspecified: Secondary | ICD-10-CM | POA: Insufficient documentation

## 2023-08-06 DIAGNOSIS — C3490 Malignant neoplasm of unspecified part of unspecified bronchus or lung: Secondary | ICD-10-CM | POA: Diagnosis not present

## 2023-08-06 DIAGNOSIS — Z1152 Encounter for screening for COVID-19: Secondary | ICD-10-CM | POA: Insufficient documentation

## 2023-08-06 DIAGNOSIS — J449 Chronic obstructive pulmonary disease, unspecified: Secondary | ICD-10-CM | POA: Insufficient documentation

## 2023-08-06 DIAGNOSIS — D7589 Other specified diseases of blood and blood-forming organs: Secondary | ICD-10-CM | POA: Diagnosis present

## 2023-08-06 DIAGNOSIS — J9611 Chronic respiratory failure with hypoxia: Secondary | ICD-10-CM | POA: Insufficient documentation

## 2023-08-06 DIAGNOSIS — D649 Anemia, unspecified: Secondary | ICD-10-CM | POA: Diagnosis present

## 2023-08-06 LAB — URINALYSIS, W/ REFLEX TO CULTURE (INFECTION SUSPECTED)
Bilirubin Urine: NEGATIVE
Glucose, UA: NEGATIVE mg/dL
Hgb urine dipstick: NEGATIVE
Ketones, ur: NEGATIVE mg/dL
Leukocytes,Ua: NEGATIVE
Nitrite: NEGATIVE
Protein, ur: NEGATIVE mg/dL
Specific Gravity, Urine: 1.019 (ref 1.005–1.030)
Squamous Epithelial / HPF: 0 /[HPF] (ref 0–5)
pH: 6 (ref 5.0–8.0)

## 2023-08-06 LAB — PROTIME-INR
INR: 1.1 (ref 0.8–1.2)
Prothrombin Time: 14.9 s (ref 11.4–15.2)

## 2023-08-06 LAB — RESP PANEL BY RT-PCR (RSV, FLU A&B, COVID)  RVPGX2
Influenza A by PCR: NEGATIVE
Influenza B by PCR: NEGATIVE
Resp Syncytial Virus by PCR: NEGATIVE
SARS Coronavirus 2 by RT PCR: NEGATIVE

## 2023-08-06 LAB — PROCALCITONIN: Procalcitonin: 0.22 ng/mL

## 2023-08-06 LAB — COMPREHENSIVE METABOLIC PANEL
ALT: 18 U/L (ref 0–44)
AST: 15 U/L (ref 15–41)
Albumin: 3.5 g/dL (ref 3.5–5.0)
Alkaline Phosphatase: 115 U/L (ref 38–126)
Anion gap: 16 — ABNORMAL HIGH (ref 5–15)
BUN: 8 mg/dL (ref 8–23)
CO2: 25 mmol/L (ref 22–32)
Calcium: 7.1 mg/dL — ABNORMAL LOW (ref 8.9–10.3)
Chloride: 90 mmol/L — ABNORMAL LOW (ref 98–111)
Creatinine, Ser: 0.59 mg/dL — ABNORMAL LOW (ref 0.61–1.24)
GFR, Estimated: >60 mL/min (ref >60)
Glucose, Bld: 139 mg/dL — ABNORMAL HIGH (ref 70–99)
Potassium: 2.9 mmol/L — ABNORMAL LOW (ref 3.5–5.1)
Sodium: 131 mmol/L — ABNORMAL LOW (ref 135–145)
Total Bilirubin: 0.6 mg/dL (ref 0.3–1.2)
Total Protein: 7 g/dL (ref 6.5–8.1)

## 2023-08-06 LAB — CBC WITH DIFFERENTIAL/PLATELET
Abs Immature Granulocytes: 0.47 10*3/uL — ABNORMAL HIGH (ref 0.00–0.07)
Basophils Absolute: 0.1 10*3/uL (ref 0.0–0.1)
Basophils Relative: 1 %
Eosinophils Absolute: 0 10*3/uL (ref 0.0–0.5)
Eosinophils Relative: 0 %
HCT: 30.3 % — ABNORMAL LOW (ref 39.0–52.0)
Hemoglobin: 9.6 g/dL — ABNORMAL LOW (ref 13.0–17.0)
Immature Granulocytes: 3 %
Lymphocytes Relative: 5 %
Lymphs Abs: 0.9 10*3/uL (ref 0.7–4.0)
MCH: 27.5 pg (ref 26.0–34.0)
MCHC: 31.7 g/dL (ref 30.0–36.0)
MCV: 86.8 fL (ref 80.0–100.0)
Monocytes Absolute: 0.9 10*3/uL (ref 0.1–1.0)
Monocytes Relative: 5 %
Neutro Abs: 15.5 10*3/uL — ABNORMAL HIGH (ref 1.7–7.7)
Neutrophils Relative %: 86 %
Platelets: 287 10*3/uL (ref 150–400)
RBC: 3.49 MIL/uL — ABNORMAL LOW (ref 4.22–5.81)
RDW: 19.9 % — ABNORMAL HIGH (ref 11.5–15.5)
WBC: 17.9 10*3/uL — ABNORMAL HIGH (ref 4.0–10.5)
nRBC: 0 % (ref 0.0–0.2)

## 2023-08-06 LAB — BRAIN NATRIURETIC PEPTIDE: B Natriuretic Peptide: 118.8 pg/mL — ABNORMAL HIGH (ref 0.0–100.0)

## 2023-08-06 LAB — MAGNESIUM: Magnesium: 0.7 mg/dL — CL (ref 1.7–2.4)

## 2023-08-06 LAB — LACTIC ACID, PLASMA: Lactic Acid, Venous: 1.3 mmol/L (ref 0.5–1.9)

## 2023-08-06 MED ORDER — METRONIDAZOLE 500 MG/100ML IV SOLN: 500.0000 mg | Freq: Three times a day (TID) | INTRAVENOUS | Status: DC

## 2023-08-06 MED ORDER — METRONIDAZOLE 500 MG/100ML IV SOLN
500.0000 mg | Freq: Two times a day (BID) | INTRAVENOUS | Status: DC
  Administered 2023-08-07 – 2023-08-08 (×3): 500 mg via INTRAVENOUS
  Filled 2023-08-06 (×4): qty 100

## 2023-08-06 MED ORDER — MOMETASONE FURO-FORMOTEROL FUM 200-5 MCG/ACT IN AERO
2.0000 | INHALATION_SPRAY | Freq: Two times a day (BID) | RESPIRATORY_TRACT | Status: DC
  Administered 2023-08-07 – 2023-08-08 (×3): 2 via RESPIRATORY_TRACT
  Filled 2023-08-06 (×2): qty 8.8

## 2023-08-06 MED ORDER — IPRATROPIUM-ALBUTEROL 0.5-2.5 (3) MG/3ML IN SOLN
3.0000 mL | Freq: Four times a day (QID) | RESPIRATORY_TRACT | Status: DC
  Administered 2023-08-07: 3 mL via RESPIRATORY_TRACT
  Filled 2023-08-06: qty 3

## 2023-08-06 MED ORDER — METRONIDAZOLE 500 MG/100ML IV SOLN
500.0000 mg | Freq: Once | INTRAVENOUS | Status: AC
  Administered 2023-08-06: 500 mg via INTRAVENOUS
  Filled 2023-08-06: qty 100

## 2023-08-06 MED ORDER — ASPIRIN 81 MG PO TBEC
81.0000 mg | DELAYED_RELEASE_TABLET | Freq: Every day | ORAL | Status: DC
  Administered 2023-08-07 – 2023-08-08 (×2): 81 mg via ORAL
  Filled 2023-08-06 (×2): qty 1

## 2023-08-06 MED ORDER — SODIUM CHLORIDE 0.9 % IV SOLN
2.0000 g | Freq: Once | INTRAVENOUS | Status: AC
  Administered 2023-08-06: 2 g via INTRAVENOUS
  Filled 2023-08-06: qty 12.5

## 2023-08-06 MED ORDER — GABAPENTIN 300 MG PO CAPS
300.0000 mg | ORAL_CAPSULE | Freq: Three times a day (TID) | ORAL | Status: DC
  Administered 2023-08-06 – 2023-08-08 (×5): 300 mg via ORAL
  Filled 2023-08-06 (×7): qty 1

## 2023-08-06 MED ORDER — ENOXAPARIN SODIUM 40 MG/0.4ML IJ SOSY
40.0000 mg | PREFILLED_SYRINGE | INTRAMUSCULAR | Status: DC
  Administered 2023-08-06 – 2023-08-07 (×2): 40 mg via SUBCUTANEOUS
  Filled 2023-08-06 (×2): qty 0.4

## 2023-08-06 MED ORDER — LIDOCAINE 5 % EX PTCH
1.0000 | MEDICATED_PATCH | Freq: Once | CUTANEOUS | Status: AC
  Administered 2023-08-06: 1 via TRANSDERMAL
  Filled 2023-08-06: qty 1

## 2023-08-06 MED ORDER — METOPROLOL SUCCINATE ER 50 MG PO TB24
100.0000 mg | ORAL_TABLET | Freq: Every day | ORAL | Status: DC
  Administered 2023-08-06 – 2023-08-08 (×3): 100 mg via ORAL
  Filled 2023-08-06 (×3): qty 2

## 2023-08-06 MED ORDER — POTASSIUM CHLORIDE 10 MEQ/100ML IV SOLN
10.0000 meq | INTRAVENOUS | Status: AC
  Administered 2023-08-06: 10 meq via INTRAVENOUS
  Filled 2023-08-06 (×2): qty 100

## 2023-08-06 MED ORDER — SENNOSIDES-DOCUSATE SODIUM 8.6-50 MG PO TABS
1.0000 | ORAL_TABLET | Freq: Two times a day (BID) | ORAL | Status: DC
  Administered 2023-08-06 – 2023-08-08 (×4): 1 via ORAL
  Filled 2023-08-06 (×4): qty 1

## 2023-08-06 MED ORDER — SODIUM CHLORIDE 0.9 % IV SOLN
2.0000 g | Freq: Three times a day (TID) | INTRAVENOUS | Status: DC
  Administered 2023-08-06 – 2023-08-07 (×4): 2 g via INTRAVENOUS
  Filled 2023-08-06 (×7): qty 12.5

## 2023-08-06 MED ORDER — CLOPIDOGREL BISULFATE 75 MG PO TABS
75.0000 mg | ORAL_TABLET | Freq: Every day | ORAL | Status: DC
  Administered 2023-08-07 – 2023-08-08 (×2): 75 mg via ORAL
  Filled 2023-08-06 (×2): qty 1

## 2023-08-06 MED ORDER — AMLODIPINE BESYLATE 5 MG PO TABS
5.0000 mg | ORAL_TABLET | Freq: Every day | ORAL | Status: DC
  Administered 2023-08-07: 5 mg via ORAL
  Filled 2023-08-06: qty 1

## 2023-08-06 MED ORDER — MAGNESIUM SULFATE 4 GM/100ML IV SOLN
4.0000 g | Freq: Once | INTRAVENOUS | Status: DC
  Filled 2023-08-06 (×2): qty 100

## 2023-08-06 MED ORDER — MONTELUKAST SODIUM 10 MG PO TABS
10.0000 mg | ORAL_TABLET | Freq: Every day | ORAL | Status: DC
  Administered 2023-08-06 – 2023-08-08 (×3): 10 mg via ORAL
  Filled 2023-08-06 (×3): qty 1

## 2023-08-06 MED ORDER — CHLORHEXIDINE GLUCONATE CLOTH 2 % EX PADS
6.0000 | MEDICATED_PAD | Freq: Every day | CUTANEOUS | Status: DC
  Administered 2023-08-07 – 2023-08-08 (×2): 6 via TOPICAL

## 2023-08-06 MED ORDER — OXYCODONE HCL 5 MG PO TABS
5.0000 mg | ORAL_TABLET | Freq: Three times a day (TID) | ORAL | Status: DC | PRN
  Administered 2023-08-07 (×2): 5 mg via ORAL
  Filled 2023-08-06 (×3): qty 1

## 2023-08-06 MED ORDER — ACETAMINOPHEN 500 MG PO TABS
1000.0000 mg | ORAL_TABLET | Freq: Three times a day (TID) | ORAL | Status: DC | PRN
  Administered 2023-08-06 – 2023-08-07 (×4): 1000 mg via ORAL
  Filled 2023-08-06 (×5): qty 2

## 2023-08-06 MED ORDER — PANTOPRAZOLE SODIUM 40 MG PO TBEC
40.0000 mg | DELAYED_RELEASE_TABLET | Freq: Two times a day (BID) | ORAL | Status: DC
  Administered 2023-08-06 – 2023-08-08 (×4): 40 mg via ORAL
  Filled 2023-08-06 (×4): qty 1

## 2023-08-06 MED ORDER — POTASSIUM CHLORIDE CRYS ER 20 MEQ PO TBCR
40.0000 meq | EXTENDED_RELEASE_TABLET | Freq: Once | ORAL | Status: AC
  Administered 2023-08-06: 40 meq via ORAL
  Filled 2023-08-06: qty 2

## 2023-08-06 MED ORDER — MAGNESIUM SULFATE 4 GM/100ML IV SOLN
4.0000 g | Freq: Once | INTRAVENOUS | Status: AC
  Administered 2023-08-06: 4 g via INTRAVENOUS
  Filled 2023-08-06: qty 100

## 2023-08-06 MED ORDER — SODIUM CHLORIDE 0.9 % IV BOLUS
1000.0000 mL | Freq: Once | INTRAVENOUS | Status: AC
  Administered 2023-08-06: 1000 mL via INTRAVENOUS

## 2023-08-06 MED ORDER — MAGNESIUM SULFATE 2 GM/50ML IV SOLN
2.0000 g | Freq: Once | INTRAVENOUS | Status: AC
  Administered 2023-08-06: 2 g via INTRAVENOUS
  Filled 2023-08-06: qty 50

## 2023-08-06 MED ORDER — MORPHINE SULFATE (PF) 4 MG/ML IV SOLN
4.0000 mg | Freq: Once | INTRAVENOUS | Status: AC
  Administered 2023-08-06: 4 mg via INTRAVENOUS
  Filled 2023-08-06: qty 1

## 2023-08-06 MED ORDER — IOHEXOL 300 MG/ML  SOLN
100.0000 mL | Freq: Once | INTRAMUSCULAR | Status: AC | PRN
  Administered 2023-08-06: 100 mL via INTRAVENOUS

## 2023-08-06 NOTE — ED Notes (Signed)
Pt transported to CT ?

## 2023-08-06 NOTE — ED Notes (Signed)
Admission MD at bedside.  

## 2023-08-06 NOTE — ED Notes (Signed)
Pt provided warm blanket and repositioned in bed. Pt denies further needs at this time.

## 2023-08-06 NOTE — ED Provider Notes (Signed)
[facility] Provider Note    Event Date/Time   First MD Initiated Contact with Patient 08/06/23 1051     (approximate)   History   Abdominal Pain   HPI  Austin Patterson is a 67 year old male with history of stage IV lung cancer on chemotherapy, asthma/COPD on 2 L O2, pulmonary fibrosis, CHF, CAD, HTN presenting to the emergency department for evaluation of weakness and abdominal pain.  Patient reports that for the past few days he has felt generally weak with abdominal pain.  Denies nausea, vomiting, diarrhea.  No fevers.  Last chemo treatment was 2 weeks ago.  Reports gradually worsening bilateral lower leg swelling, denies chest pain or shortness of breath from baseline.    Physical Exam   Triage Vital Signs: ED Triage Vitals  Encounter Vitals Group     BP 08/06/23 1036 (!) 146/82     Systolic BP Percentile --      Diastolic BP Percentile --      Pulse Rate 08/06/23 1036 (!) 110     Resp 08/06/23 1036 (!) 24     Temp 08/06/23 1036 97.6 F (36.4 C)     Temp src --      SpO2 08/06/23 1036 97 %     Weight 08/06/23 1037 185 lb 3 oz (84 kg)     Height 08/06/23 1037 5\' 7"  (1.702 m)     Head Circumference --      Peak Flow --      Pain Score 08/06/23 1037 8     Pain Loc --      Pain Education --      Exclude from Growth Chart --     Most recent vital signs: Vitals:   08/06/23 1434 08/06/23 1540  BP:    Pulse:    Resp:    Temp: 99.6 F (37.6 C) 98.5 F (36.9 C)  SpO2:       General: Awake, interactive  CV:  Tachycardic with regular rhythm, normal peripheral perfusion Chest wall: Dressing in place over the left chest when removed, ecchymosis at the site of recent port placement without appreciable erythema, warmth, drainage Resp:  Lung sounds mildly coarse, mild tachypnea Abd:  Soft, nondistended, mild generalized tenderness to palpation Neuro:  Symmetric facial movement, fluid speech   ED Results / Procedures / Treatments    Labs (all labs ordered are listed, but only abnormal results are displayed) Labs Reviewed  COMPREHENSIVE METABOLIC PANEL - Abnormal; Notable for the following components:      Result Value   Sodium 131 (*)    Potassium 2.9 (*)    Chloride 90 (*)    Glucose, Bld 139 (*)    Creatinine, Ser 0.59 (*)    Calcium 7.1 (*)    Anion gap 16 (*)    All other components within normal limits  CBC WITH DIFFERENTIAL/PLATELET - Abnormal; Notable for the following components:   WBC 17.9 (*)    RBC 3.49 (*)    Hemoglobin 9.6 (*)    HCT 30.3 (*)    RDW 19.9 (*)    Neutro Abs 15.5 (*)    Abs Immature Granulocytes 0.47 (*)    All other components within normal limits  URINALYSIS, W/ REFLEX TO CULTURE (INFECTION SUSPECTED) - Abnormal; Notable for the following components:   Color, Urine YELLOW (*)    APPearance CLEAR (*)    Bacteria, UA RARE (*)    All other components within normal limits  BRAIN NATRIURETIC PEPTIDE - Abnormal; Notable for the following components:   B Natriuretic Peptide 118.8 (*)    All other components within normal limits  MAGNESIUM - Abnormal; Notable for the following components:   Magnesium 0.7 (*)    All other components within normal limits  RESP PANEL BY RT-PCR (RSV, FLU A&B, COVID)  RVPGX2  CULTURE, BLOOD (ROUTINE X 2)  CULTURE, BLOOD (ROUTINE X 2)  LACTIC ACID, PLASMA  PROTIME-INR  LACTIC ACID, PLASMA     EKG EKG independently reviewed interpreted by myself (ER attending) demonstrates:  EKG demonstrate sinus rhythm at a rate of 92, PR 157, QRS 146, QTc 499, right bundle branch block present, no acute ST changes  RADIOLOGY Imaging independently reviewed and interpreted by myself demonstrates:  CXR with improved right basilar mass, no new focal consolidation CT abdomen pelvis without bowel obstruction or other acute finding  PROCEDURES:  Critical Care performed: Yes, see critical care procedure note(s)  Procedures  CRITICAL CARE Performed by: Trinna Post   Total critical care time: 32 minutes  Critical care time was exclusive of separately billable procedures and treating other patients.  Critical care was necessary to treat or prevent imminent or life-threatening deterioration.  Critical care was time spent personally by me on the following activities: development of treatment plan with patient and/or surrogate as well as nursing, discussions with consultants, evaluation of patient's response to treatment, examination of patient, obtaining history from patient or surrogate, ordering and performing treatments and interventions, ordering and review of laboratory studies, ordering and review of radiographic studies, pulse oximetry and re-evaluation of patient's condition.   MEDICATIONS ORDERED IN ED: Medications  lidocaine (LIDODERM) 5 % 1 patch (1 patch Transdermal Patch Applied 08/06/23 1342)  potassium chloride 10 mEq in 100 mL IVPB (10 mEq Intravenous New Bag/Given 08/06/23 1532)  magnesium sulfate IVPB 2 g 50 mL (2 g Intravenous New Bag/Given 08/06/23 1530)  ceFEPIme (MAXIPIME) 2 g in sodium chloride 0.9 % 100 mL IVPB (0 g Intravenous Stopped 08/06/23 1234)  metroNIDAZOLE (FLAGYL) IVPB 500 mg (0 mg Intravenous Stopped 08/06/23 1341)  iohexol (OMNIPAQUE) 300 MG/ML solution 100 mL (100 mLs Intravenous Contrast Given 08/06/23 1306)  sodium chloride 0.9 % bolus 1,000 mL (0 mLs Intravenous Stopped 08/06/23 1434)  morphine (PF) 4 MG/ML injection 4 mg (4 mg Intravenous Given 08/06/23 1525)     IMPRESSION / MDM / ASSESSMENT AND PLAN / ED COURSE  I reviewed the triage vital signs and the nursing notes.  Differential diagnosis includes, but is not limited to, colitis, diverticulitis, appendicitis, other acute intra-abdominal process, pneumonia, electrolyte abnormality, CHF  Patient's presentation is most consistent with acute presentation with potential threat to life or bodily function.  67 year old male presenting to the emergency  department for evaluation of weakness.  Tachycardic and tachypneic on presentation.  Labs notable for leukocytosis at 17.9, new compared to prior.  Stable anemia.  CMP notable for hypokalemia 2.9.  IV repletion ordered.  Magnesium added which also returned significantly low at 0.7, IV magnesium repletion ordered.  Ordered for 1 L of IV fluids in the setting of his tachycardia with initial improvement, but with BNP elevation we will hold off on further fluids for now.  Normal lactate.  Urine without evidence of infection.  Chest x-Ioan Landini without obvious consolidation.  CT abdomen pelvis without acute findings.  Unknown exact source, but I am concerned about sepsis given patient's vitals and labs.  Will reach out to hospitalist team to discuss admission.  Reviewed with hospitalist team.  They will evaluate the patient for anticipated admission.     FINAL CLINICAL IMPRESSION(S) / ED DIAGNOSES   Final diagnoses:  Weakness generalized  Generalized abdominal pain     Rx / DC Orders   ED Discharge Orders     None        Note:  This document was prepared using Dragon voice recognition software and may include unintentional dictation errors.   Trinna Post, MD 08/06/23 [phone]

## 2023-08-06 NOTE — ED Notes (Signed)
Lab at bedside to obtain 2nd set of blood cultures.

## 2023-08-06 NOTE — Progress Notes (Signed)
PHARMACY -  BRIEF ANTIBIOTIC NOTE   Pharmacy has received consult(s) for Cefepime from an ED provider.  The patient's profile has been reviewed for ht/wt/allergies/indication/available labs.    One time order(s) placed for Cefepime by ED provider.  Further antibiotics/pharmacy consults should be ordered by admitting physician if indicated.                       Thank you, Foye Deer 08/06/2023  11:41 AM

## 2023-08-06 NOTE — ED Notes (Signed)
Lab called with critical lab magnesium 0.7. EDP made aware at this time.

## 2023-08-06 NOTE — Plan of Care (Signed)
CHL Tonsillectomy/Adenoidectomy, Postoperative PEDS care plan entered in error.

## 2023-08-06 NOTE — ED Notes (Signed)
Lab collected 2nd set blood cultures prior to abx start.

## 2023-08-06 NOTE — ED Notes (Addendum)
This RN and Geographical information systems officer attempting IV access without success. Pt has port recently placed after removed due to sepsis. Port has not been accessed since placed. EDP made aware.

## 2023-08-06 NOTE — Sepsis Progress Note (Signed)
eLink is following this Code Sepsis. °

## 2023-08-06 NOTE — ED Notes (Signed)
RN at bedside for US IV

## 2023-08-06 NOTE — Progress Notes (Signed)
Pharmacy Antibiotic Note  Austin Patterson is a 67 y.o. male admitted on 08/06/2023 needing  empiric tx for SIRS in immunocompromised pt, per oncology .  Pharmacy has been consulted for cefepime dosing.  Plan: Day 1 of antibiotics Initiate patient on cefepime 2 g IV Q8H Patient is also on Metronidazole 500 mg IV Q8H Continue to monitor renal function and follow culture results   Height: 5\' 7"  (170.2 cm) Weight: 84 kg (185 lb 3 oz) IBW/kg (Calculated) : 66.1  Temp (24hrs), Avg:98.5 F (36.9 C), Min:97.6 F (36.4 C), Max:99.6 F (37.6 C)  Recent Labs  Lab 08/06/23 1038  WBC 17.9*  CREATININE 0.59*  LATICACIDVEN 1.3    Estimated Creatinine Clearance: 92.9 mL/min (A) (by C-G formula based on SCr of 0.59 mg/dL (L)).    Allergies  Allergen Reactions   Amoxicillin Anaphylaxis   Tizanidine     Feet and ankle swell     Antimicrobials this admission: 10/18 Cefepime >>  10/18 Metronidazole >>   Dose adjustments this admission: None  Microbiology results: 10/18 BCx: IP  Thank you for allowing pharmacy to be a part of this patient's care.  Merryl Hacker, PharmD Clinical Pharmacist 08/06/2023 6:01 PM

## 2023-08-06 NOTE — ED Notes (Signed)
Lab called to get blood cultures.

## 2023-08-06 NOTE — Progress Notes (Signed)
CODE SEPSIS - PHARMACY COMMUNICATION  **Broad Spectrum Antibiotics should be administered within 1 hour of Sepsis diagnosis**  Time Code Sepsis Called/Page Received: 11:34  Antibiotics Ordered: Cefepime and Metronidazole  Time of 1st antibiotic administration: Cefepime given at 12:04  Additional action taken by pharmacy: n/a  If necessary, Name of Provider/Nurse Contacted: n/a    Foye Deer ,PharmD Clinical Pharmacist  08/06/2023  11:41 AM

## 2023-08-06 NOTE — ED Triage Notes (Signed)
Pt to ED [facility] for abd pain for a few days and generalized not feeling well. Chemo 2 weeks ago, lung cancer, new port placed a couple weeks ago. Bilateral leg swelling, gradually getting worse Wears 2 L [place] chronic

## 2023-08-06 NOTE — H&P (Signed)
History and Physical    Austin Patterson ZOX:[id number] DOB: [birth year] DOA: 08/06/2023  PCP: Pcp, No  Patient coming from: home  I have personally briefly reviewed patient's old medical records in [facility]  Chief Complaint: abdominal pain and weakness  HPI: Austin Patterson is a 67 y.o. 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    Pt reported abdominal pain in the upper region for a few days.  Had diarrhea a couple of days ago, but normal BM this morning.  Baseline dyspnea on 2L O2, no worsening dyspnea or cough.  Had reduced urine output and reduced oral intake.  Has BLE swelling which pt said is chronic.  No N/V.  Last chemo about 2 weeks ago.  Pt had port removed on 8/26 due to sepsis, which was replaced on 07/30/23.   ED Course: initial vitals: afebrile, pulse 110, BP 146/82, RR 24, sating 97% on 2L O2.  Labs notable for potassium 2.9, mag 0.7, WBC 17.9.  CXR and UA without infectious finding.  CT a/p with no acute finding.  Pt received cefepime and Flagyl in the ED and was admitted for observation.   Assessment/Plan  SIRS --WBC 17.9, tachycardia, tachypnea.  No source of infection yet, however, given immunocompromised state and potential for complications, pt was started on empiric abx and admitted for observation.  Discussed with pt's oncology Dr. Alena Bills. Plan: --Per Dr. Alena Bills, cont cefepime and Flagyl for 1-2 days, if wbc trends down no fevers and if feeling better may discontinue.  --blood cx pending  Abdominal pain --CT a/p showed no acute finding.  With brief period of diarrhea, maybe gastroenteritis, or chemo side effect.  Hypokalemia Hypomag --maybe due to reduced oral intake and GI loss. --monitor and supplement PRN  COPD Chronic hypoxemic respiratory failure on 2L O2 --cont 2L  O2 --cont home bronchodilators  Stage IV lung cancer on chemotherapy --Per oncology Dr. Alena Bills, no need to see while inpatient, will continue f/u as outpatient  Neuropathy  Chronic pain on chronic opioids --cont home gabapentin --cont home oxycodone PRN  HTN --cont home amlodipine and Toprol   DVT prophylaxis: Lovenox SQ Code Status: Full code  Family Communication:   Disposition Plan: home  Consults called: none Level of care: Med-Surg   Review of Systems: As per HPI otherwise complete review of systems negative.   Past Medical History:  Diagnosis Date   Abdominal pain 12/07/2022   Acute urinary retention 12/07/2022   Chest pain 01/24/2022   COPD (chronic obstructive pulmonary disease) (HCC)    GERD (gastroesophageal reflux disease)    Hemothorax on right 05/11/2023   Hyperlipidemia    Hypertension    Pleuritic pain 05/10/2023   Pneumothorax after biopsy 05/11/2023   Post procedure discomfort 05/13/2023   Pulmonary fibrosis (HCC) 11/2015    Past Surgical History:  Procedure Laterality Date   COLONOSCOPY     CORONARY STENT PLACEMENT     ESOPHAGOGASTRODUODENOSCOPY (EGD) WITH PROPOFOL N/A 09/23/2016   Procedure: ESOPHAGOGASTRODUODENOSCOPY (EGD) WITH PROPOFOL;  Surgeon: Wyline Mood, MD;  Location: [facility] ENDOSCOPY;  Service: Endoscopy;  Laterality: N/A;   IR IMAGING GUIDED PORT INSERTION  05/26/2023   IR IMAGING GUIDED PORT INSERTION  07/30/2023   IR RADIOLOGIST EVAL & MGMT  07/20/2023   KYPHOPLASTY N/A 03/14/2020   Procedure: T7 & T11 KYPHOPLASTY;  Surgeon: Kennedy Bucker, MD;  Location: [facility] ORS;  Service: Orthopedics;  Laterality: N/A;   PORT-A-CATH REMOVAL N/A 06/14/2023   Procedure: REMOVAL PORT-A-CATH;  Surgeon: Sung Amabile, DO;  Location: [facility] ORS;  Service: General;  Laterality: N/A;   SHOULDER ACROMIOPLASTY       reports that he quit smoking about 7 years ago. His smoking use included cigarettes. He started smoking about 57 years ago. He has a 100 pack-year  smoking history. He has quit using smokeless tobacco. He reports current alcohol use of about 56.0 standard drinks of alcohol per week. He reports that he does not use drugs.  Allergies  Allergen Reactions   Amoxicillin Anaphylaxis   Tizanidine     Feet and ankle swell     Family History  Problem Relation Age of Onset   Heart disease Mother     Prior to Admission medications   Medication Sig Start Date End Date Taking? Authorizing Provider  acetaminophen (TYLENOL) 500 MG tablet Take 1,000 mg by mouth every 6 (six) hours as needed for moderate pain or headache.   Yes [provider]  amLODipine (NORVASC) 5 MG tablet Take 1 tablet (5 mg total) by mouth at bedtime. Skip the dose if systolic BP less than 130 mmHg 06/22/23 12/19/23 Yes Gillis Santa, MD  aspirin EC 81 MG tablet Take 1 tablet (81 mg total) by mouth daily. 08/19/16  Yes Virl Axe, MD  atorvastatin (LIPITOR) 40 MG tablet Take 1 tablet (40 mg total) by mouth at bedtime. 08/19/16  Yes Virl Axe, MD  CALCIUM 600/VITAMIN D 600-10 MG-MCG TABS Take 1 tablet by mouth 2 (two) times daily. 11/20/21  Yes [provider]  clopidogrel (PLAVIX) 75 MG tablet Take 1 tablet (75 mg total) by mouth daily. 08/19/16  Yes Virl Axe, MD  dexamethasone (DECADRON) 4 MG tablet Take 2 tablets (8mg ) by mouth daily starting the day after carboplatin for 3 days. Take with food 05/28/23  Yes Michaelyn Barter, MD  Dupilumab (DUPIXENT) 300 MG/2ML SOPN Inject 300 mg into the skin every 14 (fourteen) days. 11/04/22  Yes Salena Saner, MD  EPINEPHrine 0.3 mg/0.3 mL IJ SOAJ injection Inject 0.3 mg into the muscle as needed for anaphylaxis.   Yes [provider]  ferrous sulfate 325 (65 FE) MG tablet Take 1 tablet (325 mg total) by mouth 2 (two) times daily with a meal. 06/22/23 09/20/23 Yes Gillis Santa, MD  fluticasone-salmeterol (ADVAIR DISKUS) 250-50 MCG/ACT AEPB Inhale 1 puff into the lungs in the morning and at bedtime. 03/04/22   Yes Salena Saner, MD  gabapentin (NEURONTIN) 300 MG capsule TAKE 1 CAPSULE BY MOUTH THREE TIMES A DAY 07/07/23  Yes Salena Saner, MD  guaiFENesin (ROBITUSSIN) 100 MG/5ML liquid Take 5 mLs by mouth every 4 (four) hours as needed for cough or to loosen phlegm. 07/05/23  Yes Michaelyn Barter, MD  guaiFENesin-dextromethorphan (ROBITUSSIN DM) 100-10 MG/5ML syrup Take 5 mLs by mouth every 4 (four) hours as needed for cough. 06/01/23  Yes Michaelyn Barter, MD  ipratropium-albuterol (DUONEB) 0.5-2.5 (3) MG/3ML SOLN INHALE 1 VIAL THROUGH NEBULIZER EVERY 6 HOURS 10/01/22  Yes Salena Saner, MD  lidocaine-prilocaine (EMLA) cream Apply to affected area once 05/28/23  Yes Michaelyn Barter, MD  magnesium oxide (MAG-OX) 400 (241.3 Mg) MG tablet Take 1 tablet (400 mg total) by mouth daily. 06/18/17  Yes Milagros Loll, MD  metoprolol succinate (TOPROL-XL) 100 MG 24 hr tablet Take 1 tablet (100 mg total) by mouth daily. 06/22/23 06/21/24 Yes Lucianne Muss,  Dileep, MD  montelukast (SINGULAIR) 10 MG tablet Take 10 mg by mouth daily. 11/19/21  Yes [provider]  Multiple Vitamin (MULTIVITAMIN WITH MINERALS) TABS tablet Take 1 tablet by mouth daily. 09/24/16  Yes Enedina Finner, MD  ondansetron (ZOFRAN) 8 MG tablet Take 1 tablet (8 mg total) by mouth every 8 (eight) hours as needed for nausea or vomiting. Start on the third day after carboplatin. 05/28/23  Yes Michaelyn Barter, MD  oxyCODONE (OXY IR/ROXICODONE) 5 MG immediate release tablet Take 1 tablet (5 mg total) by mouth every 8 (eight) hours as needed for up to 90 doses for severe pain. 07/05/23  Yes Michaelyn Barter, MD  pantoprazole (PROTONIX) 40 MG tablet Take 1 tablet (40 mg total) by mouth 2 (two) times daily. 09/23/16  Yes Enedina Finner, MD  prochlorperazine (COMPAZINE) 10 MG tablet Take 1 tablet (10 mg total) by mouth every 6 (six) hours as needed for nausea or vomiting. 05/28/23  Yes Michaelyn Barter, MD  senna-docusate (SENOKOT-S) 8.6-50 MG tablet Take 1 tablet by  mouth 2 (two) times daily. 12/09/22  Yes Esaw Grandchild A, DO  albuterol (VENTOLIN HFA) 108 (90 Base) MCG/ACT inhaler Inhale 2 puffs into the lungs every 6 (six) hours as needed for wheezing or shortness of breath. 01/29/20   Danford, Earl Lites, MD  chlorpheniramine-HYDROcodone (TUSSIONEX) 10-8 MG/5ML Take 5 mLs by mouth at bedtime as needed for cough. Patient not taking: Reported on 06/12/2023 04/05/23   Sunnie Nielsen, DO  predniSONE (DELTASONE) 10 MG tablet Take 1 tablet (10 mg total) by mouth daily as needed. Take as directed. Patient not taking: Reported on 08/06/2023 04/27/23   Salena Saner, MD  Spacer/Aero-Holding Chambers (AEROCHAMBER MV) inhaler Use as instructed 10/05/19   Salena Saner, MD    Physical Exam: Vitals:   08/06/23 1400 08/06/23 1434 08/06/23 1500 08/06/23 1540  BP: (!) 166/79  (!) 159/79   Pulse: 89  98   Resp:   (!) 23   Temp:  99.6 F (37.6 C)  98.5 F (36.9 C)  TempSrc:  Oral  Oral  SpO2: 92%  96%   Weight:      Height:        Constitutional: NAD, AAOx3 HEENT: conjunctivae and lids normal, EOMI CV: No cyanosis.   RESP: normal respiratory effort, on 2L SKIN: warm, dry Neuro: II - XII grossly intact.   Psych: Normal mood and affect.  Appropriate judgement and reason  Labs on Admission: I have personally reviewed labs and imaging studies  Time spent: 55 minutes  Darlin Priestly MD [facility]  If 7PM-7AM, please contact night-coverage 08/06/2023, 4:18 PM

## 2023-08-06 NOTE — ED Notes (Signed)
EDP made aware of chills and increase in temperature.

## 2023-08-07 DIAGNOSIS — R6510 Systemic inflammatory response syndrome (SIRS) of non-infectious origin without acute organ dysfunction: Secondary | ICD-10-CM | POA: Diagnosis not present

## 2023-08-07 DIAGNOSIS — C3491 Malignant neoplasm of unspecified part of right bronchus or lung: Secondary | ICD-10-CM | POA: Diagnosis not present

## 2023-08-07 LAB — BASIC METABOLIC PANEL
Anion gap: 9 (ref 5–15)
BUN: 10 mg/dL (ref 8–23)
CO2: 27 mmol/L (ref 22–32)
Calcium: 6.7 mg/dL — ABNORMAL LOW (ref 8.9–10.3)
Chloride: 98 mmol/L (ref 98–111)
Creatinine, Ser: 0.65 mg/dL (ref 0.61–1.24)
GFR, Estimated: >60 mL/min (ref >60)
Glucose, Bld: 129 mg/dL — ABNORMAL HIGH (ref 70–99)
Potassium: 3.6 mmol/L (ref 3.5–5.1)
Sodium: 134 mmol/L — ABNORMAL LOW (ref 135–145)

## 2023-08-07 LAB — CBC
HCT: 26.9 % — ABNORMAL LOW (ref 39.0–52.0)
Hemoglobin: 8.7 g/dL — ABNORMAL LOW (ref 13.0–17.0)
MCH: 27.8 pg (ref 26.0–34.0)
MCHC: 32.3 g/dL (ref 30.0–36.0)
MCV: 85.9 fL (ref 80.0–100.0)
Platelets: 295 10*3/uL (ref 150–400)
RBC: 3.13 MIL/uL — ABNORMAL LOW (ref 4.22–5.81)
RDW: 19.9 % — ABNORMAL HIGH (ref 11.5–15.5)
WBC: 18 10*3/uL — ABNORMAL HIGH (ref 4.0–10.5)
nRBC: 0 % (ref 0.0–0.2)

## 2023-08-07 LAB — MAGNESIUM: Magnesium: 1.8 mg/dL (ref 1.7–2.4)

## 2023-08-07 MED ORDER — POLYSACCHARIDE IRON COMPLEX 150 MG PO CAPS
150.0000 mg | ORAL_CAPSULE | Freq: Every day | ORAL | Status: DC
  Administered 2023-08-07 – 2023-08-08 (×2): 150 mg via ORAL
  Filled 2023-08-07 (×2): qty 1

## 2023-08-07 MED ORDER — IPRATROPIUM-ALBUTEROL 0.5-2.5 (3) MG/3ML IN SOLN: 3.0000 mL | Freq: Four times a day (QID) | RESPIRATORY_TRACT | Status: DC | PRN

## 2023-08-07 NOTE — Plan of Care (Signed)

## 2023-08-07 NOTE — Progress Notes (Signed)
Progress Note   Patient: Austin Patterson JYN:[id number] DOB: 05/23/1956 DOA: 08/06/2023     0 DOS: the patient was seen and examined on 08/07/2023   Brief hospital course: Austin Patterson is a 67 y.o. 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  Patient had a diarrhea about 2 or 3 days ago. Patient had a recent chemotherapy for lung cancer.  Lab work showed significant leukocytosis, CT abdomen pelvis did not show acute changes.  Chest x-ray no acute changes.  After discussion with oncology, patient was started on broad-spectrum antibiotics for 48 hours.   Principal Problem:   SIRS (systemic inflammatory response syndrome) (HCC) Active Problems:   Iron deficiency anemia   Assessment and Plan: SIRS Stage IV lung cancer on chemotherapy Gastroenteritis. Patient has a recent diarrhea, consistent with gastroenteritis.  Patient also admitted criteria for SIRS/sepsis.  Blood culture still pending.  Due to immunocompromise status, patient was started on broad-spectrum antibiotics.  Continue for another day, follow-up blood cultures, may consider discontinue antibiotic tomorrow if leukocytosis better.   Hypokalemia Hypomag Hyponatremia. Condition improved.   COPD Chronic hypoxemic respiratory failure on 2L O2 No exacerbation.  Stable.   Neuropathy  Chronic pain on chronic opioids --cont home gabapentin --cont home oxycodone PRN   HTN --cont home amlodipine and Toprol       Subjective:  Patient feels better today, no nausea vomiting or diarrhea.  Physical Exam: Vitals:   08/06/23 1832 08/06/23 2308 08/07/23 0733 08/07/23 0816  BP: 129/65 (!) 147/125  136/69  Pulse: (!) 109 88  97  Resp: 15 18  18   Temp: 98.4 F (36.9 C) 98.3 F (36.8 C)  98.1 F (36.7 C)  TempSrc:    Oral  SpO2: 95% 95% 93% 97%   Weight:      Height:       General exam: Appears calm and comfortable  Respiratory system: Clear to auscultation. Respiratory effort normal. Cardiovascular system: S1 & S2 heard, RRR. No JVD, murmurs, rubs, gallops or clicks. No pedal edema. Gastrointestinal system: Abdomen is nondistended, soft and nontender. No organomegaly or masses felt. Normal bowel sounds heard. Central nervous system: Alert and oriented. No focal neurological deficits. Extremities: Symmetric 5 x 5 power. Skin: No rashes, lesions or ulcers Psychiatry: Judgement and insight appear normal. Mood & affect appropriate.    Data Reviewed:  Lab results reviewed, CT scan results reviewed.  Family Communication: None  Disposition: Status is: Observation     Time spent: 35 minutes  Author: Marrion Coy, MD 08/07/2023 12:39 PM  For on call review [email or web address].

## 2023-08-07 NOTE — Hospital Course (Signed)
Austin Patterson is a 67 y.o. 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  Patient had a diarrhea about 2 or 3 days ago. Patient had a recent chemotherapy for lung cancer.  Lab work showed significant leukocytosis, CT abdomen pelvis did not show acute changes.  Chest x-ray no acute changes.  After discussion with oncology, patient was started on broad-spectrum antibiotics for 48 hours. Patient feels better today, still has no complaint.  Medically stable to be discharged, continue some oral antibiotics.

## 2023-08-08 DIAGNOSIS — C3491 Malignant neoplasm of unspecified part of right bronchus or lung: Secondary | ICD-10-CM | POA: Diagnosis not present

## 2023-08-08 DIAGNOSIS — R6510 Systemic inflammatory response syndrome (SIRS) of non-infectious origin without acute organ dysfunction: Secondary | ICD-10-CM | POA: Diagnosis not present

## 2023-08-08 DIAGNOSIS — E663 Overweight: Secondary | ICD-10-CM | POA: Insufficient documentation

## 2023-08-08 DIAGNOSIS — K529 Noninfective gastroenteritis and colitis, unspecified: Secondary | ICD-10-CM | POA: Diagnosis not present

## 2023-08-08 LAB — CBC
HCT: 26.4 % — ABNORMAL LOW (ref 39.0–52.0)
Hemoglobin: 8.5 g/dL — ABNORMAL LOW (ref 13.0–17.0)
MCH: 28 pg (ref 26.0–34.0)
MCHC: 32.2 g/dL (ref 30.0–36.0)
MCV: 86.8 fL (ref 80.0–100.0)
Platelets: 309 10*3/uL (ref 150–400)
RBC: 3.04 MIL/uL — ABNORMAL LOW (ref 4.22–5.81)
RDW: 19.9 % — ABNORMAL HIGH (ref 11.5–15.5)
WBC: 19.3 10*3/uL — ABNORMAL HIGH (ref 4.0–10.5)
nRBC: 0 % (ref 0.0–0.2)

## 2023-08-08 LAB — BASIC METABOLIC PANEL
Anion gap: 9 (ref 5–15)
BUN: 11 mg/dL (ref 8–23)
CO2: 25 mmol/L (ref 22–32)
Calcium: 6.7 mg/dL — ABNORMAL LOW (ref 8.9–10.3)
Chloride: 98 mmol/L (ref 98–111)
Creatinine, Ser: 0.5 mg/dL — ABNORMAL LOW (ref 0.61–1.24)
GFR, Estimated: >60 mL/min (ref >60)
Glucose, Bld: 112 mg/dL — ABNORMAL HIGH (ref 70–99)
Potassium: 3.7 mmol/L (ref 3.5–5.1)
Sodium: 132 mmol/L — ABNORMAL LOW (ref 135–145)

## 2023-08-08 LAB — MAGNESIUM: Magnesium: 1.4 mg/dL — ABNORMAL LOW (ref 1.7–2.4)

## 2023-08-08 LAB — PHOSPHORUS: Phosphorus: 2.1 mg/dL — ABNORMAL LOW (ref 2.5–4.6)

## 2023-08-08 MED ORDER — POTASSIUM & SODIUM PHOSPHATES 280-160-250 MG PO PACK
2.0000 | PACK | Freq: Three times a day (TID) | ORAL | Status: DC
  Administered 2023-08-08: 2 via ORAL
  Filled 2023-08-08 (×3): qty 2

## 2023-08-08 MED ORDER — MAGNESIUM SULFATE 4 GM/100ML IV SOLN
4.0000 g | Freq: Once | INTRAVENOUS | Status: AC
  Administered 2023-08-08: 4 g via INTRAVENOUS
  Filled 2023-08-08: qty 100

## 2023-08-08 MED ORDER — CEPHALEXIN 500 MG PO CAPS: 500.0000 mg | ORAL_CAPSULE | Freq: Three times a day (TID) | ORAL | 0 refills | Status: AC

## 2023-08-08 MED ORDER — METRONIDAZOLE 500 MG PO TABS: 500.0000 mg | ORAL_TABLET | Freq: Two times a day (BID) | ORAL | 0 refills | Status: AC

## 2023-08-08 MED ORDER — POTASSIUM & SODIUM PHOSPHATES 280-160-250 MG PO PACK: 2.0000 | PACK | Freq: Three times a day (TID) | ORAL | 0 refills | Status: AC

## 2023-08-08 NOTE — Plan of Care (Signed)
  Problem: Education: Goal: Knowledge of General Education information will improve Description: Including pain rating scale, medication(s)/side effects and non-pharmacologic comfort measures Outcome: Progressing   Problem: Clinical Measurements: Goal: Ability to maintain clinical measurements within normal limits will improve Outcome: Progressing Goal: Will remain free from infection Outcome: Progressing   Problem: Elimination: Goal: Will not experience complications related to bowel motility Outcome: Progressing Goal: Will not experience complications related to urinary retention Outcome: Progressing

## 2023-08-08 NOTE — TOC Progression Note (Signed)
Transition of Care [facility]) - Progression Note    Patient Details  Name: Austin Patterson MRN: [id number] Date of Birth: 09/30/1956  Transition of Care [facility]) CM/SW Contact  Bing Quarry, RN Phone Number: 08/08/2023, 12:22 PM  Clinical Narrative: 10/20: Attempting to reach out for MOON notification but patient discharged at the same time.   Gabriel Cirri MSN RN CM  Transitions of Care Department [facility] [phone] Weekends Only            Expected Discharge Plan and Services         Expected Discharge Date: 08/08/23                                     Social Determinants of Health (SDOH) Interventions SDOH Screenings   Food Insecurity: No Food Insecurity (06/12/2023)  Housing: Low Risk  (06/12/2023)  Transportation Needs: Unmet Transportation Needs (08/04/2023)  Utilities: Not At Risk (06/12/2023)  Tobacco Use: Medium Risk (08/06/2023)    Readmission Risk Interventions    06/14/2023   11:19 AM  Readmission Risk Prevention Plan  Transportation Screening Complete  Medication Review (RN Care Manager) Complete  PCP or Specialist appointment within 3-5 days of discharge Complete  SW Recovery Care/Counseling Consult Complete  Palliative Care Screening Not Applicable  Skilled Nursing Facility Not Applicable

## 2023-08-08 NOTE — Discharge Instructions (Signed)
Transportation Resources  Agency Name: [facility] Agency Address: [address] [place], [place] [address] Phone: [phone] Email: [email or web address] Website: [email or web address] Service(s) Offered: Housing services, self-sufficiency, congregate meal program, weatherization program, Field seismologist program, emergency food assistance,  housing counseling, home ownership program, wheels-towork program.  Agency Name: [facility] [employer] [phone]) Address: [address] [place], [place] [address] Phone: [phone] Website: [email or web address] Service(s) Offered: Transportation for [employer], subscription and demand response; Dial-a-Ride for citizens 10 years of age or older.  Agency Name: Department [facility] Social Services Address: [address] [place], [place] [address] Phone: [phone] Service(s) Offered: Child support services; child welfare services; food stamps; Medicaid; work first family assistance; and aid with fuel,  rent, food and medicine, transportation assistance.  Agency Name: Disabled [employer] (DAV) Transportation  Network Phone: [phone] Service(s) Offered: Transports veterans to the [facility] medical center. Call  forty-eight hours in advance and leave the name, telephone  number, date, and time of appointment. Veteran will be  contacted by the driver the day before the appointment to  arrange a pick up point   Transportation Resources  Agency Name: [facility] Agency Address: [address] [place], [place] [address] Phone: [phone] Email: [email or web address] Website: [email or web address] Service(s) Offered: Housing services, self-sufficiency, congregate meal program, weatherization program, Field seismologist program, emergency food assistance,  housing counseling, home ownership program, wheels-towork  program.  Agency Name: [facility] [employer] [phone]) Address: [address], [place], [place] [address] Phone: [phone] Website: [email or web address] Service(s) Offered: Transportation for [employer], subscription and demand response; Dial-a-Ride for citizens 40 years of age or older.  Agency Name: Department of Social Services Address: [address] [place], [place] [address] Phone: [phone] Service(s) Offered: Child support services; child welfare services; food stamps; Medicaid; work first family assistance; and aid with fuel,  rent, food and medicine, transportation assistance.  Agency Name: Disabled [employer] (DAV) Transportation  Network Phone: [phone] Service(s) Offered: Transports veterans to the [facility] medical center. Call  forty-eight hours in advance and leave the name, telephone  number, date, and time of appointment. Veteran will be  contacted by the driver the day before the appointment to  arrange a pick up point    [employer] currently provides door to door services. ACTA connects with [employer] daily for services to [facility]. [employer] also performs contract services to [employer] operates 27 vehicles, all but 3 mini-vans are equipped with lifts for special needs as well as the general public. [employer] drivers are each CDL certified and trained in First Aid and CPR. [employer] was established in 2002 by [employer]. An independent Industrial/product designer. ACT[employer] operates via Cytogeneticist with required Research scientist (physical sciences) from [place]. [employer] provides over 80,000 passenger trips each year, including [facility] and [employer] sites.  Call at least by 11 AM one business day prior to needing transportation  [employer].                      [place], [place] [address]     Office  Hours: Monday-Friday  8 AM - 5 PM

## 2023-08-08 NOTE — Discharge Summary (Signed)
Physician Discharge Summary   Patient: Austin Patterson MRN: [id number] DOB: [birth year]  Admit date:     08/06/2023  Discharge date: 08/08/23  Discharge Physician: Marrion Coy   PCP: Pcp, No   Recommendations at discharge:   Follow-up with oncology as a previous scheduled.  Discharge Diagnoses: Principal Problem:   SIRS (systemic inflammatory response syndrome) (HCC) Active Problems:   Pulmonary fibrosis (HCC)   Stage IV adenocarcinoma of lung (HCC)   Iron deficiency anemia   Paroxysmal atrial fibrillation (HCC)   Overweight (BMI 25.0-29.9)  Resolved Problems:   * No resolved hospital problems. *  Hospital Course: Austin Patterson is a 67 y.o. 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  Patient had a diarrhea about 2 or 3 days ago. Patient had a recent chemotherapy for lung cancer.  Lab work showed significant leukocytosis, CT abdomen pelvis did not show acute changes.  Chest x-ray no acute changes.  After discussion with oncology, patient was started on broad-spectrum antibiotics for 48 hours. Patient feels better today, still has no complaint.  Medically stable to be discharged, continue some oral antibiotics.  Assessment and Plan: SIRS Stage IV lung cancer on chemotherapy Gastroenteritis. Patient has a recent diarrhea, consistent with gastroenteritis.  Patient also admitted criteria for SIRS/sepsis.  Blood culture negative.  Due to immunocompromise status, patient was started on broad-spectrum antibiotics with cefepime and Flagyl. Patient condition had improved, symptom has resolved.  At this point, I will continue 5 days antibiotics with oral cephalexin and Flagyl. Patient will be followed with oncology as previous scheduled.   Hypokalemia Hypomag Hyponatremia. Hypophosphatemia. Patient will give 4 g  magnesium sulfate for potassium 1.4, patient will continue oral Phos supplement after giving initial 2 doses.   COPD Chronic hypoxemic respiratory failure on 2L O2 No exacerbation.  Stable.   Neuropathy  Chronic pain on chronic opioids --cont home gabapentin --cont home oxycodone PRN   HTN --cont home amlodipine and Toprol          Consultants: None Procedures performed: None  Disposition: Home health Diet recommendation:  Discharge Diet Orders (From admission, onward)     Start     Ordered   08/08/23 0000  Diet - low sodium heart healthy        08/08/23 1202           Cardiac diet DISCHARGE MEDICATION: Allergies as of 08/08/2023       Reactions   Amoxicillin Anaphylaxis   Tizanidine    Feet and ankle swell         Medication List     STOP taking these medications    chlorpheniramine-HYDROcodone 10-8 MG/5ML Commonly known as: TUSSIONEX   dexamethasone 4 MG tablet Commonly known as: DECADRON   predniSONE 10 MG tablet Commonly known as: DELTASONE       TAKE these medications    acetaminophen 500 MG tablet Commonly known as: TYLENOL Take 1,000 mg by mouth every 6 (six) hours as needed for moderate pain or headache.   AeroChamber MV inhaler Use as instructed   albuterol 108 (90 Base) MCG/ACT inhaler Commonly known as: VENTOLIN HFA Inhale 2 puffs into the lungs every 6 (six) hours as needed for wheezing or shortness of breath.   amLODipine 5 MG tablet Commonly known as: NORVASC Take 1 tablet (5 mg total) by mouth at bedtime. Skip  the dose if systolic BP less than 130 mmHg   aspirin EC 81 MG tablet Take 1 tablet (81 mg total) by mouth daily.   atorvastatin 40 MG tablet Commonly known as: LIPITOR Take 1 tablet (40 mg total) by mouth at bedtime.   Calcium 600/Vitamin D 600-10 MG-MCG Tabs Generic drug: Calcium Carb-Cholecalciferol Take 1 tablet by mouth 2 (two) times daily.   cephALEXin 500 MG capsule Commonly known as: KEFLEX Take  1 capsule (500 mg total) by mouth 3 (three) times daily for 5 days.   clopidogrel 75 MG tablet Commonly known as: PLAVIX Take 1 tablet (75 mg total) by mouth daily.   Dupixent 300 MG/2ML Sopn Generic drug: Dupilumab Inject 300 mg into the skin every 14 (fourteen) days.   EPINEPHrine 0.3 mg/0.3 mL Soaj injection Commonly known as: EPI-PEN Inject 0.3 mg into the muscle as needed for anaphylaxis.   ferrous sulfate 325 (65 FE) MG tablet Take 1 tablet (325 mg total) by mouth 2 (two) times daily with a meal.   fluticasone-salmeterol 250-50 MCG/ACT Aepb Commonly known as: Advair Diskus Inhale 1 puff into the lungs in the morning and at bedtime.   gabapentin 300 MG capsule Commonly known as: NEURONTIN TAKE 1 CAPSULE BY MOUTH THREE TIMES A DAY   guaiFENesin 100 MG/5ML liquid Commonly known as: ROBITUSSIN Take 5 mLs by mouth every 4 (four) hours as needed for cough or to loosen phlegm.   guaiFENesin-dextromethorphan 100-10 MG/5ML syrup Commonly known as: ROBITUSSIN DM Take 5 mLs by mouth every 4 (four) hours as needed for cough.   ipratropium-albuterol 0.5-2.5 (3) MG/3ML Soln Commonly known as: DUONEB INHALE 1 VIAL THROUGH NEBULIZER EVERY 6 HOURS   lidocaine-prilocaine cream Commonly known as: EMLA Apply to affected area once   magnesium oxide 400 (241.3 Mg) MG tablet Commonly known as: MAG-OX Take 1 tablet (400 mg total) by mouth daily.   metoprolol succinate 100 MG 24 hr tablet Commonly known as: TOPROL-XL Take 1 tablet (100 mg total) by mouth daily.   metroNIDAZOLE 500 MG tablet Commonly known as: Flagyl Take 1 tablet (500 mg total) by mouth 2 (two) times daily for 3 days.   montelukast 10 MG tablet Commonly known as: SINGULAIR Take 10 mg by mouth daily.   multivitamin with minerals Tabs tablet Take 1 tablet by mouth daily.   ondansetron 8 MG tablet Commonly known as: Zofran Take 1 tablet (8 mg total) by mouth every 8 (eight) hours as needed for nausea or  vomiting. Start on the third day after carboplatin.   oxyCODONE 5 MG immediate release tablet Commonly known as: Oxy IR/ROXICODONE Take 1 tablet (5 mg total) by mouth every 8 (eight) hours as needed for up to 90 doses for severe pain.   pantoprazole 40 MG tablet Commonly known as: PROTONIX Take 1 tablet (40 mg total) by mouth 2 (two) times daily.   potassium & sodium phosphates 280-160-250 MG Pack Commonly known as: PHOS-NAK Take 2 packets by mouth 4 (four) times daily -  before meals and at bedtime for 3 doses.   prochlorperazine 10 MG tablet Commonly known as: COMPAZINE Take 1 tablet (10 mg total) by mouth every 6 (six) hours as needed for nausea or vomiting.   senna-docusate 8.6-50 MG tablet Commonly known as: Senokot-S Take 1 tablet by mouth 2 (two) times daily.        Discharge Exam: Filed Weights   08/06/23 1037  Weight: 84 kg   General exam: Appears calm and comfortable  Respiratory  system: Clear to auscultation. Respiratory effort normal. Cardiovascular system: S1 & S2 heard, RRR. No JVD, murmurs, rubs, gallops or clicks. No pedal edema. Gastrointestinal system: Abdomen is nondistended, soft and nontender. No organomegaly or masses felt. Normal bowel sounds heard. Central nervous system: Alert and oriented. No focal neurological deficits. Extremities: Symmetric 5 x 5 power. Skin: No rashes, lesions or ulcers Psychiatry: Judgement and insight appear normal. Mood & affect appropriate.    Condition at discharge: good  The results of significant diagnostics from this hospitalization (including imaging, microbiology, ancillary and laboratory) are listed below for reference.   Imaging Studies: CT ABDOMEN PELVIS W CONTRAST  Result Date: 08/06/2023 CLINICAL DATA:  Acute abdominal pain for several days. Lung carcinoma undergoing chemotherapy. * Tracking Code: BO * EXAM: CT ABDOMEN AND PELVIS WITH CONTRAST TECHNIQUE: Multidetector CT imaging of the abdomen and pelvis  was performed using the standard protocol following bolus administration of intravenous contrast. RADIATION DOSE REDUCTION: This exam was performed according to the departmental dose-optimization program which includes automated exposure control, adjustment of the mA and/or kV according to patient size and/or use of iterative reconstruction technique. CONTRAST:  OMNIPAQUE IOHEXOL 300 MG/ML  SOLN COMPARISON:  05/10/2023 FINDINGS: Lower Chest: A mass is again seen in the inferior right middle lobe. This is incompletely visualized on this exam, but appears decreased in size since prior chest CT. Hepatobiliary: No suspicious hepatic masses identified. Gallbladder is unremarkable. No evidence of biliary ductal dilatation. Pancreas:  No mass or inflammatory changes. Spleen: Within normal limits in size and appearance. Adrenals/Urinary Tract: Previously characterized benign bilateral adrenal adenomas remain stable (No followup imaging is recommended). No suspicious renal masses identified. Bilateral renal parenchymal scarring again noted. No evidence of ureteral calculi or hydronephrosis. Stomach/Bowel: No evidence of obstruction, inflammatory process or abnormal fluid collections. Diverticulosis is seen mainly involving the sigmoid colon, however there is no evidence of diverticulitis. Vascular/Lymphatic: Stable 1.5 cm porta hepatis lymph node. No new or increased sites of lymphadenopathy identified. No acute vascular findings. Reproductive:  Stable mildly enlarged prostate. Other:  None. Musculoskeletal: No suspicious bone lesions identified. Chronic bilateral L5 pars defects again noted, without associated spondylolisthesis. IMPRESSION: No acute findings within the abdomen or pelvis. Stable mild porta hepatis lymphadenopathy. No new or progressive metastatic disease. Colonic diverticulosis, without radiographic evidence of diverticulitis. Stable mildly enlarged prostate. Electronically Signed   By: Danae Orleans  M.D.   On: 08/06/2023 15:29   DG Chest Port 1 View  Result Date: 08/06/2023 CLINICAL DATA:  Sepsis EXAM: PORTABLE CHEST 1 VIEW COMPARISON:  06/18/2023 FINDINGS: Interval placement of left IJ approach chest port with distal tip terminating at the level of the distal SVC. Heart size is normal. Previously seen right basilar mass has significantly decreased in size compared to prior x-ray. Emphysematous changes bilaterally. Mild bibasilar atelectasis. No pleural effusion or pneumothorax. Advanced degenerative changes of both shoulders. IMPRESSION: 1. Interval placement of left IJ approach chest port with distal tip terminating at the level of the distal SVC. 2. Previously seen right basilar mass has significantly decreased in size compared to prior x-ray. 3. Mild bibasilar atelectasis. Electronically Signed   By: Duanne Guess D.O.   On: 08/06/2023 13:39   IR IMAGING GUIDED PORT INSERTION  Result Date: 07/30/2023 INDICATION: port a cath; lung cancer EXAM: Placement of chest port using ultrasound and fluoroscopic guidance MEDICATIONS: Documented in the EMR ANESTHESIA/SEDATION: Moderate (conscious) sedation was employed during this procedure. A total of Versed 2 mg and Fentanyl 75 mcg was  administered intravenously. Moderate Sedation Time: 33 minutes. The patient's level of consciousness and vital signs were monitored continuously by radiology nursing throughout the procedure under my direct supervision. FLUOROSCOPY TIME:  Fluoroscopy Time: 2.1 minutes (8 mGy) COMPLICATIONS: None immediate. PROCEDURE: Informed written consent was obtained from the patient after a thorough discussion of the procedural risks, benefits and alternatives. All questions were addressed. Maximal Sterile Barrier Technique was utilized including caps, mask, sterile gowns, sterile gloves, sterile drape, hand hygiene and skin antiseptic. A timeout was performed prior to the initiation of the procedure. The patient was placed supine on  the exam table. The left neck and chest was prepped and draped in the standard sterile fashion. A preliminary ultrasound of the left neck was performed and demonstrates a patent left internal jugular vein. A permanent ultrasound image was stored in the electronic medical record. The overlying skin was anesthetized with 1% Lidocaine. Using ultrasound guidance, access was obtained into the left internal jugular vein using a 21 gauge micropuncture set. A wire was advanced into the SVC, a short incision was made at the puncture site, and serial dilatation performed. Next, in an ipsilateral infraclavicular location, an incision was made at the site of the subcutaneous reservoir. Blunt dissection was used to open a pocket to contain the reservoir. A subcutaneous tunnel was then created from the port site to the puncture site. A(n) 8 Fr single lumen catheter was advanced through the tunnel. The catheter was attached to the port and this was placed in the subcutaneous pocket. Under fluoroscopic guidance, a peel away sheath was placed, and the catheter was trimmed to the appropriate length and was advanced into the central veins. The catheter length is 28 cm. The tip of the catheter lies near the superior cavoatrial junction. The port flushes and aspirates appropriately. The port was flushed and locked with heparinized saline. The port pocket was closed in 2 layers using 3-0 and 4-0 Vicryl/absorbable suture. Dermabond was also applied to both incisions. The patient tolerated the procedure well and was transferred to recovery in stable condition. IMPRESSION: Successful placement of a left-sided chest port via the left internal jugular vein. The catheter tip terminates at the superior cavoatrial junction. The port is ready for immediate use. Electronically Signed   By: Olive Bass M.D.   On: 07/30/2023 14:46   IR Radiologist Eval & Mgmt  Result Date: 07/20/2023 EXAM: ESTABLISHED PATIENT OFFICE VISIT CHIEF COMPLAINT:  Refer to EMR HISTORY OF PRESENT ILLNESS: The patient had a right chest port placed on May 26, 2023, and presented to the emergency room on June 12, 2023 with concern for port site infection and sepsis. The port was subsequently removed by general surgery on June 14, 2023. Personal review of the chart demonstrated that both blood cultures and wound cultures were negative for any microorganisms. Patient reports that the chest wound was left open, and was healing by secondary intent with a home health nurse packing the wound. On today, he reports no systemic symptoms of infection. He denies fevers or chills. Physical examination of the right chest wound demonstrates near-complete re-epithelialization of the chest wound. There is no erythema, fluctuance or tenderness. The patient is interested in having another chest port placed for continuing therapy. Can plan for likely left-sided chest port placement at [facility] next week. REVIEW OF SYSTEMS: Refer to EMR PHYSICAL EXAMINATION: Refer to EMR ASSESSMENT AND PLAN: Refer to EMR Electronically Signed   By: Olive Bass M.D.   On: 07/20/2023 10:38  Microbiology: Results for orders placed or performed during the hospital encounter of 08/06/23  Culture, blood (Routine x 2)     Status: None (Preliminary result)   Collection Time: 08/06/23 11:45 AM   Specimen: BLOOD  Result Value Ref Range Status   Specimen Description BLOOD BLOOD RIGHT ARM  Final   Special Requests   Final    BOTTLES DRAWN AEROBIC AND ANAEROBIC Blood Culture adequate volume   Culture   Final    NO GROWTH 2 DAYS Performed at [facility], [address] [place], [place] [address]    Report Status PENDING  Incomplete  Culture, blood (Routine x 2)     Status: None (Preliminary result)   Collection Time: 08/06/23 11:45 AM   Specimen: BLOOD  Result Value Ref Range Status   Specimen Description BLOOD BLOOD RIGHT ARM  Final   Special Requests   Final    BOTTLES DRAWN AEROBIC AND  ANAEROBIC Blood Culture adequate volume   Culture   Final    NO GROWTH 2 DAYS Performed at [facility], [address] [place], [place] [address]    Report Status PENDING  Incomplete  Resp panel by RT-PCR (RSV, Flu A&B, Covid) Anterior Nasal Swab     Status: None   Collection Time: 08/06/23 11:45 AM   Specimen: Anterior Nasal Swab  Result Value Ref Range Status   SARS Coronavirus 2 by RT PCR NEGATIVE NEGATIVE Final    Comment: (NOTE) SARS-CoV-2 target nucleic acids are NOT DETECTED.  The SARS-CoV-2 RNA is generally detectable in upper respiratory specimens during the acute phase of infection. The lowest concentration of SARS-CoV-2 viral copies this assay can detect is 138 copies/mL. A negative result does not preclude SARS-Cov-2 infection and should not be used as the sole basis for treatment or other patient management decisions. A negative result may occur with  improper specimen collection/handling, submission of specimen other than nasopharyngeal swab, presence of viral mutation(s) within the areas targeted by this assay, and inadequate number of viral copies(<138 copies/mL). A negative result must be combined with clinical observations, patient history, and epidemiological information. The expected result is Negative.  Fact Sheet for Patients:  BloggerCourse.com  Fact Sheet for Healthcare Providers:  SeriousBroker.it  This test is no t yet approved or cleared by the [place] FDA and  has been authorized for detection and/or diagnosis of SARS-CoV-2 by FDA under an Emergency Use Authorization (EUA). This EUA will remain  in effect (meaning this test can be used) for the duration of the COVID-19 declaration under Section 564(b)(1) of the Act, 21 U.S.C.section 360bbb-3(b)(1), unless the authorization is terminated  or revoked sooner.       Influenza A by PCR NEGATIVE NEGATIVE Final   Influenza B by PCR  NEGATIVE NEGATIVE Final    Comment: (NOTE) The Xpert Xpress SARS-CoV-2/FLU/RSV plus assay is intended as an aid in the diagnosis of influenza from Nasopharyngeal swab specimens and should not be used as a sole basis for treatment. Nasal washings and aspirates are unacceptable for Xpert Xpress SARS-CoV-2/FLU/RSV testing.  Fact Sheet for Patients: BloggerCourse.com  Fact Sheet for Healthcare Providers: SeriousBroker.it  This test is not yet approved or cleared by the [place] FDA and has been authorized for detection and/or diagnosis of SARS-CoV-2 by FDA under an Emergency Use Authorization (EUA). This EUA will remain in effect (meaning this test can be used) for the duration of the COVID-19 declaration under Section 564(b)(1) of the Act, 21 U.S.C. section 360bbb-3(b)(1),  unless the authorization is terminated or revoked.     Resp Syncytial Virus by PCR NEGATIVE NEGATIVE Final    Comment: (NOTE) Fact Sheet for Patients: BloggerCourse.com  Fact Sheet for Healthcare Providers: SeriousBroker.it  This test is not yet approved or cleared by the [place] FDA and has been authorized for detection and/or diagnosis of SARS-CoV-2 by FDA under an Emergency Use Authorization (EUA). This EUA will remain in effect (meaning this test can be used) for the duration of the COVID-19 declaration under Section 564(b)(1) of the Act, 21 U.S.C. section 360bbb-3(b)(1), unless the authorization is terminated or revoked.  Performed at [facility], [address] [place], [place] [address]     Labs: CBC: Recent Labs  Lab 08/06/23 1038 08/07/23 0517 08/08/23 0524  WBC 17.9* 18.0* 19.3*  NEUTROABS 15.5*  --   --   HGB 9.6* 8.7* 8.5*  HCT 30.3* 26.9* 26.4*  MCV 86.8 85.9 86.8  PLT 287 295 309   Basic Metabolic Panel: Recent Labs  Lab 08/06/23 1038 08/07/23 0517  08/08/23 0524  NA 131* 134* 132*  K 2.9* 3.6 3.7  CL 90* 98 98  CO2 25 27 25   GLUCOSE 139* 129* 112*  BUN 8 10 11   CREATININE 0.59* 0.65 0.50*  CALCIUM 7.1* 6.7* 6.7*  MG 0.7* 1.8 1.4*  PHOS  --   --  2.1*   Liver Function Tests: Recent Labs  Lab 08/06/23 1038  AST 15  ALT 18  ALKPHOS 115  BILITOT 0.6  PROT 7.0  ALBUMIN 3.5   CBG: No results for input(s): "GLUCAP" in the last 168 hours.  Discharge time spent: greater than 30 minutes.  Signed: Marrion Coy, MD [facility] 08/08/2023

## 2023-08-08 NOTE — Progress Notes (Signed)
Patient was given verbal and written discharge instructions, he acknowledge understanding and states he will comply with appointments, taken to car by wheelchair on 2 liters of oxygen, no distress noted when leaving the floor.

## 2023-08-08 NOTE — TOC Progression Note (Signed)
Transition of Care [facility]) - Progression Note    Patient Details  Name: Austin Patterson MRN: [id number] Date of Birth: January 27, 1956  [facility] CM/SW Contact  Bing Quarry, RN Phone Number: 08/08/2023, 12:01 PM  Clinical Narrative:  10/20: Transportation resources added to AVS via Care Coordination for identified SDOH flags/needs.   Gabriel Cirri MSN RN CM  Transitions of Care Department [facility] [phone] Weekends Only           Expected Discharge Plan and Services                                               Social Determinants of Health (SDOH) Interventions SDOH Screenings   Food Insecurity: No Food Insecurity (06/12/2023)  Housing: Low Risk  (06/12/2023)  Transportation Needs: Unmet Transportation Needs (08/04/2023)  Utilities: Not At Risk (06/12/2023)  Tobacco Use: Medium Risk (08/06/2023)    Readmission Risk Interventions    06/14/2023   11:19 AM  Readmission Risk Prevention Plan  Transportation Screening Complete  Medication Review (RN Care Manager) Complete  PCP or Specialist appointment within 3-5 days of discharge Complete  SW Recovery Care/Counseling Consult Complete  Palliative Care Screening Not Applicable  Skilled Nursing Facility Not Applicable

## 2023-08-11 LAB — CULTURE, BLOOD (ROUTINE X 2)
Culture: NO GROWTH
Culture: NO GROWTH
Special Requests: ADEQUATE
Special Requests: ADEQUATE

## 2023-08-12 ENCOUNTER — Encounter: Payer: Self-pay | Admitting: Internal Medicine

## 2023-08-13 MED FILL — Fosaprepitant Dimeglumine For IV Infusion 150 MG (Base Eq): INTRAVENOUS | Qty: 5 | Status: AC

## 2023-08-16 ENCOUNTER — Inpatient Hospital Stay: Payer: Medicare HMO

## 2023-08-16 ENCOUNTER — Encounter: Payer: Self-pay | Admitting: Internal Medicine

## 2023-08-16 ENCOUNTER — Inpatient Hospital Stay (HOSPITAL_BASED_OUTPATIENT_CLINIC_OR_DEPARTMENT_OTHER): Payer: Medicare HMO | Admitting: Internal Medicine

## 2023-08-16 VITALS — BP 140/70 | HR 79 | Temp 95.6°F | Resp 19

## 2023-08-16 VITALS — BP 140/80 | HR 84 | Temp 97.0°F | Wt 189.0 lb

## 2023-08-16 DIAGNOSIS — C3491 Malignant neoplasm of unspecified part of right bronchus or lung: Secondary | ICD-10-CM

## 2023-08-16 DIAGNOSIS — Z5111 Encounter for antineoplastic chemotherapy: Secondary | ICD-10-CM | POA: Diagnosis not present

## 2023-08-16 DIAGNOSIS — D509 Iron deficiency anemia, unspecified: Secondary | ICD-10-CM

## 2023-08-16 DIAGNOSIS — Z5112 Encounter for antineoplastic immunotherapy: Secondary | ICD-10-CM | POA: Diagnosis not present

## 2023-08-16 LAB — CMP (CANCER CENTER ONLY)
ALT: 18 U/L (ref 0–44)
AST: 18 U/L (ref 15–41)
Albumin: 3.6 g/dL (ref 3.5–5.0)
Alkaline Phosphatase: 80 U/L (ref 38–126)
Anion gap: 9 (ref 5–15)
BUN: 10 mg/dL (ref 8–23)
CO2: 29 mmol/L (ref 22–32)
Calcium: 8.4 mg/dL — ABNORMAL LOW (ref 8.9–10.3)
Chloride: 96 mmol/L — ABNORMAL LOW (ref 98–111)
Creatinine: 0.61 mg/dL (ref 0.61–1.24)
GFR, Estimated: >60 mL/min (ref >60)
Glucose, Bld: 111 mg/dL — ABNORMAL HIGH (ref 70–99)
Potassium: 3.6 mmol/L (ref 3.5–5.1)
Sodium: 134 mmol/L — ABNORMAL LOW (ref 135–145)
Total Bilirubin: 0.3 mg/dL (ref 0.3–1.2)
Total Protein: 7.1 g/dL (ref 6.5–8.1)

## 2023-08-16 LAB — CBC WITH DIFFERENTIAL (CANCER CENTER ONLY)
Abs Immature Granulocytes: 0.12 10*3/uL — ABNORMAL HIGH (ref 0.00–0.07)
Basophils Absolute: 0.1 10*3/uL (ref 0.0–0.1)
Basophils Relative: 1 %
Eosinophils Absolute: 0.2 10*3/uL (ref 0.0–0.5)
Eosinophils Relative: 1 %
HCT: 28.5 % — ABNORMAL LOW (ref 39.0–52.0)
Hemoglobin: 8.9 g/dL — ABNORMAL LOW (ref 13.0–17.0)
Immature Granulocytes: 1 %
Lymphocytes Relative: 12 %
Lymphs Abs: 1.3 10*3/uL (ref 0.7–4.0)
MCH: 28.3 pg (ref 26.0–34.0)
MCHC: 31.2 g/dL (ref 30.0–36.0)
MCV: 90.5 fL (ref 80.0–100.0)
Monocytes Absolute: 0.7 10*3/uL (ref 0.1–1.0)
Monocytes Relative: 6 %
Neutro Abs: 8.6 10*3/uL — ABNORMAL HIGH (ref 1.7–7.7)
Neutrophils Relative %: 79 %
Platelet Count: 386 10*3/uL (ref 150–400)
RBC: 3.15 MIL/uL — ABNORMAL LOW (ref 4.22–5.81)
RDW: 20.4 % — ABNORMAL HIGH (ref 11.5–15.5)
WBC Count: 10.9 10*3/uL — ABNORMAL HIGH (ref 4.0–10.5)
nRBC: 0 % (ref 0.0–0.2)

## 2023-08-16 MED ORDER — SODIUM CHLORIDE 0.9 % IV SOLN
125.0000 mg/m2 | Freq: Once | INTRAVENOUS | Status: AC
  Administered 2023-08-16: 258 mg via INTRAVENOUS
  Filled 2023-08-16: qty 43

## 2023-08-16 MED ORDER — SODIUM CHLORIDE 0.9 % IV SOLN
150.0000 mg | Freq: Once | INTRAVENOUS | Status: AC
  Administered 2023-08-16: 150 mg via INTRAVENOUS
  Filled 2023-08-16: qty 150

## 2023-08-16 MED ORDER — PALONOSETRON HCL INJECTION 0.25 MG/5ML
0.2500 mg | Freq: Once | INTRAVENOUS | Status: AC
  Administered 2023-08-16: 0.25 mg via INTRAVENOUS
  Filled 2023-08-16: qty 5

## 2023-08-16 MED ORDER — HEPARIN SOD (PORK) LOCK FLUSH 100 UNIT/ML IV SOLN
500.0000 [IU] | Freq: Once | INTRAVENOUS | Status: AC | PRN
  Administered 2023-08-16: 500 [IU]
  Filled 2023-08-16: qty 5

## 2023-08-16 MED ORDER — IRON SUCROSE 20 MG/ML IV SOLN
200.0000 mg | Freq: Once | INTRAVENOUS | Status: AC
  Administered 2023-08-16: 200 mg via INTRAVENOUS
  Filled 2023-08-16: qty 10

## 2023-08-16 MED ORDER — SODIUM CHLORIDE 0.9 % IV SOLN
Freq: Once | INTRAVENOUS | Status: AC
  Filled 2023-08-16: qty 250

## 2023-08-16 MED ORDER — DIPHENHYDRAMINE HCL 50 MG/ML IJ SOLN
50.0000 mg | Freq: Once | INTRAMUSCULAR | Status: AC
  Administered 2023-08-16: 50 mg via INTRAVENOUS
  Filled 2023-08-16: qty 1

## 2023-08-16 MED ORDER — SODIUM CHLORIDE 0.9 % IV SOLN
200.0000 mg | Freq: Once | INTRAVENOUS | Status: AC
  Administered 2023-08-16: 200 mg via INTRAVENOUS
  Filled 2023-08-16: qty 8

## 2023-08-16 MED ORDER — SODIUM CHLORIDE 0.9 % IV SOLN
473.6000 mg | Freq: Once | INTRAVENOUS | Status: AC
  Administered 2023-08-16: 470 mg via INTRAVENOUS
  Filled 2023-08-16: qty 47

## 2023-08-16 MED ORDER — FAMOTIDINE IN NACL 20-0.9 MG/50ML-% IV SOLN
20.0000 mg | Freq: Once | INTRAVENOUS | Status: AC
Start: 2023-08-16 — End: 2023-08-16
  Administered 2023-08-16: 20 mg via INTRAVENOUS
  Filled 2023-08-16: qty 50

## 2023-08-16 MED ORDER — DEXAMETHASONE SODIUM PHOSPHATE 10 MG/ML IJ SOLN
10.0000 mg | Freq: Once | INTRAMUSCULAR | Status: AC
Start: 2023-08-16 — End: 2023-08-16
  Administered 2023-08-16: 10 mg via INTRAVENOUS
  Filled 2023-08-16: qty 1

## 2023-08-16 NOTE — Progress Notes (Signed)
[facility] CONSULT NOTE  Patient Care Team: Pcp, No as PCP - General Debbe Odea, MD as PCP - Cardiology (Cardiology) Salena Saner, MD as Consulting Physician (Pulmonary Disease) Glory Buff, RN as Oncology Nurse Navigator Michaelyn Barter, MD as Consulting Physician (Oncology)   CANCER STAGING   Cancer Staging  Stage IV adenocarcinoma of lung [facility]) Staging form: Lung, AJCC 8th Edition - Clinical: Stage IV (cT4, cN3, cM1) - Signed by Michaelyn Barter, MD on 05/20/2023 Stage prefix: Initial diagnosis   ASSESSMENT & PLAN:  Early Kondor Bedoya 67 y.o. 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  Pathology showed poorly differentiated carcinoma with spindle cell component.  Tumor cells positive for CK and focal TTF-1.  The differential diagnosis includes pleomorphic carcinoma and carcinosarcoma.  Definitive classification cannot be performed on the biopsy specimen.  -Tempus testing showed PD-L1 0% with no targetable mutations.  -Patient declined second opinion with [facility] or [facility] due to transportation issues/social support.  Considering there was a carcinoma component, we decided to treat like lung cancer.  -Started on dose reduced carboplatin, Taxol and Keytruda on 06/01/2023.  Poor functional status/ poor tolerance with hospitalization for sepsis/port infection/ weakness. - Labs reviewed and acceptable for treatment. Will  proceed with cycle 4 today. Hold off on G-CSF. CT abdo pelvis from 10/18 no new metastatic disease. Will schedule for CT chest w contrast to assess treatment response.   # Chronic respiratory failure on 2 L oxygen # Asthma and COPD overlap # Idiopathic pulmonary fibrosis -Management per pulmonary.  On Dupixent  # Hyponatremia -Chronic in nature.  Sodium 129 at baseline.  # Normocytic anemia -Iron panel consistent with iron deficiency. Also chemotherapy component. -Proceed with IV Venofer 200 mg.   # Cancer related pain -In mid chest.  Continue with oxycodone 5 mg Q8 as needed.   #CAD- on aspirin, plavix  # IV access-port removed on 06/14/23 due to sepsis.   -Inserted on left side on 07/30/23.  Orders Placed This Encounter  Procedures   CT CHEST W CONTRAST    Standing Status:   Future    Standing Expiration Date:   08/15/2024    Order Specific Question:   If indicated for the ordered procedure, I authorize the administration of contrast media per Radiology protocol    Answer:   Yes    Order Specific Question:   Does the patient have a contrast media/X-ray dye allergy?    Answer:   No    Order Specific Question:   Preferred imaging location?    Answer:   [facility]   CBC with Differential (Cancer Center Only)    Standing Status:   Future    Standing Expiration Date:   09/05/2024   CMP (Cancer Center only)    Standing Status:   Future    Standing Expiration Date:   09/05/2024   RTC in 3 weeks for MD visit, labs, cycle 5 keytruda  The total time spent in the appointment was 30 minutes encounter with patients  including review of chart and various tests results, discussions about plan of care and coordination of care plan   All questions were answered. The patient knows to call the clinic with any problems, questions or concerns. No barriers to learning was detected.  Michaelyn Barter, MD 10/28/202412:44 PM   HISTORY OF PRESENTING ILLNESS:  Login Brunken Schoenfeld 67 y.o.  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  Patient was seen today in the clinic for further evaluation.  He is a remote smoker quit in 2016.  Prior smoked 2 packs a day since age 73.  He is on 2 L oxygen since 2017.  Lives alone.  Was in a wheelchair.  Does not have family close by.  Follows with Dr. Jayme Cloud.   Interval history Patient was seen today for follow-up for stage IV lung adenocarcinoma and cycle 4 of CarboTaxol and Keytruda. Presented to the ED on 10/18 for diarrhea and generalized weakness.  Infectious workup was negative.  His diarrhea has resolved.  Energy levels tend to fluctuate.  Appetite is good. 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  Denies any worsening.  Patient feels comfortable to continue with the reduced dose of chemotherapy.  Does not want to increase it.  I have reviewed his chart and materials related to his cancer extensively and collaborated history with the patient. Summary of oncologic history is as follows: His breathing is stable Oncology History  Stage IV adenocarcinoma of lung (HCC)  05/18/2023 Initial Diagnosis   Sarcomatoid carcinoma of lung (HCC)   05/20/2023 Cancer Staging   Staging form: Lung, AJCC 8th Edition - Clinical: Stage IV (cT4, cN3, cM1) - Signed by Michaelyn Barter, MD on 05/20/2023 Stage prefix: Initial diagnosis   06/01/2023 -  Chemotherapy   Patient is on Treatment Plan : LUNG NSCLC Carboplatin (6) + Paclitaxel (200) + Pembrolizumab (200) D1 q21d x 4 cycles / Pembrolizumab (200) Maintenance D1 q21d       MEDICAL  HISTORY:  Past Medical History:  Diagnosis Date   Abdominal pain 12/07/2022   Acute urinary retention 12/07/2022   Chest pain 01/24/2022   COPD (chronic obstructive pulmonary disease) (HCC)    GERD (gastroesophageal reflux disease)    Hemothorax on right 05/11/2023   Hyperlipidemia    Hypertension    Pleuritic pain 05/10/2023   Pneumothorax after biopsy 05/11/2023   Post procedure discomfort 05/13/2023   Pulmonary fibrosis (HCC) 11/2015    SURGICAL HISTORY: Past Surgical History:  Procedure Laterality Date   COLONOSCOPY     CORONARY STENT PLACEMENT     ESOPHAGOGASTRODUODENOSCOPY (EGD) WITH PROPOFOL N/A 09/23/2016   Procedure: ESOPHAGOGASTRODUODENOSCOPY (EGD) WITH PROPOFOL;  Surgeon: Wyline Mood, MD;  Location: [facility] ENDOSCOPY;  Service: Endoscopy;  Laterality: N/A;   IR IMAGING GUIDED PORT INSERTION  05/26/2023   IR IMAGING GUIDED PORT INSERTION  07/30/2023   IR RADIOLOGIST EVAL & MGMT  07/20/2023   KYPHOPLASTY N/A 03/14/2020   Procedure: T7 & T11 KYPHOPLASTY;  Surgeon: Kennedy Bucker, MD;  Location: [facility] ORS;  Service: Orthopedics;  Laterality: N/A;   PORT-A-CATH REMOVAL N/A 06/14/2023   Procedure: REMOVAL PORT-A-CATH;  Surgeon: Sung Amabile, DO;  Location: [facility] ORS;  Service: General;  Laterality: N/A;   SHOULDER ACROMIOPLASTY      SOCIAL HISTORY: Social History   Socioeconomic History   Marital status: Legally Separated    Spouse name: Not on file   Number of children: Not on file   Years of education: Not on file   Highest education level: Not on file  Occupational History   Not on file  Tobacco Use   Smoking status: Former    Current packs/day: 0.00    Average packs/day: 2.0 packs/day for 50.0 years (100.0 ttl pk-yrs)    Types: Cigarettes    Start date: 10/13/1965    Quit date: 10/14/2015    Years since quitting: 7.8   Smokeless tobacco: Former  Building services engineer status: Never Used  Substance and Sexual Activity   Alcohol use: Yes    Alcohol/week: 56.0 standard  drinks of alcohol    Types: 56 Cans of beer per week    Comment: "I sit around and drink beer, that's all I got to do"   Drug use: No   Sexual activity: Not Currently  Other Topics Concern   Not on file  Social History Narrative   Not on file   Social Determinants of Health   Financial Resource Strain: Not on file  Food Insecurity: No Food Insecurity (06/12/2023)   Hunger Vital Sign    Worried About Running Out of Food in the Last Year: Never true    Ran Out of Food in the Last Year: Never true  Transportation Needs: Unmet Transportation Needs (08/04/2023)   PRAPARE - Administrator, Civil Service (Medical): Yes    Lack of Transportation (Non-Medical): No  Physical Activity: Not on file  Stress: Not on file  Social Connections: Not on file  Intimate Partner Violence: Not At Risk (06/12/2023)   Humiliation, Afraid, Rape, and Kick questionnaire    Fear of Current or Ex-Partner: No    Emotionally Abused: No    Physically Abused: No    Sexually Abused: No    FAMILY HISTORY: Family History  Problem Relation Age of Onset   Heart disease Mother     ALLERGIES:  is allergic to amoxicillin and tizanidine.  MEDICATIONS:  Current Outpatient Medications  Medication Sig Dispense Refill   acetaminophen (TYLENOL) 500 MG tablet Take 1,000 mg by mouth every 6 (six) hours as needed for moderate pain or headache.     albuterol (VENTOLIN HFA) 108 (90 Base) MCG/ACT inhaler Inhale 2 puffs into the lungs every 6 (six) hours as needed for wheezing or shortness of breath. 8 g 11   amLODipine (NORVASC) 5 MG tablet Take 1 tablet (5 mg total) by mouth at bedtime. Skip the dose if systolic BP less than 130 mmHg 30 tablet 5   aspirin EC 81 MG tablet Take 1 tablet (81 mg total) by mouth daily. 90 tablet 3   atorvastatin (LIPITOR) 40 MG tablet Take 1 tablet (40 mg total) by mouth at bedtime. 90 tablet 3   CALCIUM 600/VITAMIN D 600-10 MG-MCG TABS Take 1 tablet by mouth 2 (two) times daily.      clopidogrel (PLAVIX) 75 MG tablet Take 1 tablet (75 mg total) by mouth daily. 90 tablet 3   Dupilumab (DUPIXENT) 300 MG/2ML SOPN Inject 300 mg into the skin every 14 (fourteen) days. 12 mL 1   EPINEPHrine 0.3 mg/0.3 mL IJ SOAJ injection Inject 0.3 mg into the muscle as needed for anaphylaxis.  ferrous sulfate 325 (65 FE) MG tablet Take 1 tablet (325 mg total) by mouth 2 (two) times daily with a meal. 60 tablet 2   fluticasone-salmeterol (ADVAIR DISKUS) 250-50 MCG/ACT AEPB Inhale 1 puff into the lungs in the morning and at bedtime. 60 each 11   gabapentin (NEURONTIN) 300 MG capsule TAKE 1 CAPSULE BY MOUTH THREE TIMES A DAY 90 capsule 1   guaiFENesin (ROBITUSSIN) 100 MG/5ML liquid Take 5 mLs by mouth every 4 (four) hours as needed for cough or to loosen phlegm. 236 mL 1   guaiFENesin-dextromethorphan (ROBITUSSIN DM) 100-10 MG/5ML syrup Take 5 mLs by mouth every 4 (four) hours as needed for cough. 118 mL 0   ipratropium-albuterol (DUONEB) 0.5-2.5 (3) MG/3ML SOLN INHALE 1 VIAL THROUGH NEBULIZER EVERY 6 HOURS 270 mL 2   lidocaine-prilocaine (EMLA) cream Apply to affected area once 30 g 3   magnesium oxide (MAG-OX) 400 (241.3 Mg) MG tablet Take 1 tablet (400 mg total) by mouth daily. 30 tablet 0   metoprolol succinate (TOPROL-XL) 100 MG 24 hr tablet Take 1 tablet (100 mg total) by mouth daily. 30 tablet 11   montelukast (SINGULAIR) 10 MG tablet Take 10 mg by mouth daily.     Multiple Vitamin (MULTIVITAMIN WITH MINERALS) TABS tablet Take 1 tablet by mouth daily. 30 tablet 0   ondansetron (ZOFRAN) 8 MG tablet Take 1 tablet (8 mg total) by mouth every 8 (eight) hours as needed for nausea or vomiting. Start on the third day after carboplatin. 30 tablet 1   oxyCODONE (OXY IR/ROXICODONE) 5 MG immediate release tablet Take 1 tablet (5 mg total) by mouth every 8 (eight) hours as needed for up to 90 doses for severe pain. 90 tablet 0   pantoprazole (PROTONIX) 40 MG tablet Take 1 tablet (40 mg total) by mouth  2 (two) times daily. 90 tablet 3   prochlorperazine (COMPAZINE) 10 MG tablet Take 1 tablet (10 mg total) by mouth every 6 (six) hours as needed for nausea or vomiting. 30 tablet 1   senna-docusate (SENOKOT-S) 8.6-50 MG tablet Take 1 tablet by mouth 2 (two) times daily. 30 tablet 1   Spacer/Aero-Holding Chambers (AEROCHAMBER MV) inhaler Use as instructed 1 each 0   No current facility-administered medications for this visit.   Facility-Administered Medications Ordered in Other Visits  Medication Dose Route Frequency Provider Last Rate Last Admin   CARBOplatin (PARAPLATIN) 470 mg in sodium chloride 0.9 % 250 mL chemo infusion  470 mg Intravenous Once Michaelyn Barter, MD       heparin lock flush 100 unit/mL  500 Units Intracatheter Once PRN Michaelyn Barter, MD       PACLitaxel (TAXOL) 258 mg in sodium chloride 0.9 % 250 mL chemo infusion (> 80mg /m2)  125 mg/m2 (Treatment Plan Recorded) Intravenous Once Michaelyn Barter, MD        REVIEW OF SYSTEMS:   Pertinent information mentioned in HPI All other systems were reviewed with the patient and are negative.  PHYSICAL EXAMINATION: ECOG PERFORMANCE STATUS: 3   Vitals:   08/16/23 0951  BP: (!) 140/80  Pulse: 84  Temp: (!) 97 F (36.1 C)  SpO2: 97%    Filed Weights   08/16/23 0951  Weight: 189 lb (85.7 kg)     GENERAL:alert, no distress and comfortable SKIN: skin color, texture, turgor are normal, no rashes or significant lesions EYES: normal, conjunctiva are pink and non-injected, sclera clear OROPHARYNX:no exudate, no erythema and lips, buccal mucosa, and tongue normal  NECK: supple,  thyroid normal size, non-tender, without nodularity LYMPH:  no palpable lymphadenopathy in the cervical, axillary or inguinal LUNGS: clear to auscultation and percussion with normal breathing effort HEART: regular rate & rhythm and no murmurs and no lower extremity edema ABDOMEN:abdomen soft, non-tender and normal bowel sounds Musculoskeletal:no  cyanosis of digits and no clubbing  PSYCH: alert & oriented x 3 with fluent speech NEURO: no focal motor/sensory deficits  LABORATORY DATA:  I have reviewed the data as listed Lab Results  Component Value Date   WBC 10.9 (H) 08/16/2023   HGB 8.9 (L) 08/16/2023   HCT 28.5 (L) 08/16/2023   MCV 90.5 08/16/2023   PLT 386 08/16/2023   Recent Labs    06/16/23 0824 06/17/23 0329 07/26/23 0837 08/06/23 1038 08/07/23 0517 08/08/23 0524 08/16/23 0935  NA 133*   < > 132* 131* 134* 132* 134*  K 3.5   < > 3.9 2.9* 3.6 3.7 3.6  CL 98   < > 94* 90* 98 98 96*  CO2 28   < > 27 25 27 25 29   GLUCOSE 152*   < > 149* 139* 129* 112* 111*  BUN 10   < > 11 8 10 11 10   CREATININE 0.55*   < > 0.75 0.59* 0.65 0.50* 0.61  CALCIUM 7.0*   < > 8.7* 7.1* 6.7* 6.7* 8.4*  GFRNONAA >60   < > >60 >60 >60 >60 >60  PROT 6.5   < > 7.3 7.0  --   --  7.1  ALBUMIN 2.9*   < > 4.0 3.5  --   --  3.6  AST 18   < > 22 15  --   --  18  ALT 25   < > 17 18  --   --  18  ALKPHOS 87   < > 84 115  --   --  80  BILITOT 0.3   < > 0.5 0.6  --   --  0.3  BILIDIR <0.1  --   --   --   --   --   --   IBILI NOT CALCULATED  --   --   --   --   --   --    < > = values in this interval not displayed.    RADIOGRAPHIC STUDIES: I have personally reviewed the radiological images as listed and agreed with the findings in the report. CT ABDOMEN PELVIS W CONTRAST  Result Date: 08/06/2023 CLINICAL DATA:  Acute abdominal pain for several days. Lung carcinoma undergoing chemotherapy. * Tracking Code: BO * EXAM: CT ABDOMEN AND PELVIS WITH CONTRAST TECHNIQUE: Multidetector CT imaging of the abdomen and pelvis was performed using the standard protocol following bolus administration of intravenous contrast. RADIATION DOSE REDUCTION: This exam was performed according to the departmental dose-optimization program which includes automated exposure control, adjustment of the mA and/or kV according to patient size and/or use of iterative  reconstruction technique. CONTRAST:  OMNIPAQUE IOHEXOL 300 MG/ML  SOLN COMPARISON:  05/10/2023 FINDINGS: Lower Chest: A mass is again seen in the inferior right middle lobe. This is incompletely visualized on this exam, but appears decreased in size since prior chest CT. Hepatobiliary: No suspicious hepatic masses identified. Gallbladder is unremarkable. No evidence of biliary ductal dilatation. Pancreas:  No mass or inflammatory changes. Spleen: Within normal limits in size and appearance. Adrenals/Urinary Tract: Previously characterized benign bilateral adrenal adenomas remain stable (No followup imaging is recommended). No suspicious renal masses identified. Bilateral renal  parenchymal scarring again noted. No evidence of ureteral calculi or hydronephrosis. Stomach/Bowel: No evidence of obstruction, inflammatory process or abnormal fluid collections. Diverticulosis is seen mainly involving the sigmoid colon, however there is no evidence of diverticulitis. Vascular/Lymphatic: Stable 1.5 cm porta hepatis lymph node. No new or increased sites of lymphadenopathy identified. No acute vascular findings. Reproductive:  Stable mildly enlarged prostate. Other:  None. Musculoskeletal: No suspicious bone lesions identified. Chronic bilateral L5 pars defects again noted, without associated spondylolisthesis. IMPRESSION: No acute findings within the abdomen or pelvis. Stable mild porta hepatis lymphadenopathy. No new or progressive metastatic disease. Colonic diverticulosis, without radiographic evidence of diverticulitis. Stable mildly enlarged prostate. Electronically Signed   By: Danae Orleans M.D.   On: 08/06/2023 15:29   DG Chest Port 1 View  Result Date: 08/06/2023 CLINICAL DATA:  Sepsis EXAM: PORTABLE CHEST 1 VIEW COMPARISON:  06/18/2023 FINDINGS: Interval placement of left IJ approach chest port with distal tip terminating at the level of the distal SVC. Heart size is normal. Previously seen right basilar  mass has significantly decreased in size compared to prior x-ray. Emphysematous changes bilaterally. Mild bibasilar atelectasis. No pleural effusion or pneumothorax. Advanced degenerative changes of both shoulders. IMPRESSION: 1. Interval placement of left IJ approach chest port with distal tip terminating at the level of the distal SVC. 2. Previously seen right basilar mass has significantly decreased in size compared to prior x-ray. 3. Mild bibasilar atelectasis. Electronically Signed   By: Duanne Guess D.O.   On: 08/06/2023 13:39   IR IMAGING GUIDED PORT INSERTION  Result Date: 07/30/2023 INDICATION: port a cath; lung cancer EXAM: Placement of chest port using ultrasound and fluoroscopic guidance MEDICATIONS: Documented in the EMR ANESTHESIA/SEDATION: Moderate (conscious) sedation was employed during this procedure. A total of Versed 2 mg and Fentanyl 75 mcg was administered intravenously. Moderate Sedation Time: 33 minutes. The patient's level of consciousness and vital signs were monitored continuously by radiology nursing throughout the procedure under my direct supervision. FLUOROSCOPY TIME:  Fluoroscopy Time: 2.1 minutes (8 mGy) COMPLICATIONS: None immediate. PROCEDURE: Informed written consent was obtained from the patient after a thorough discussion of the procedural risks, benefits and alternatives. All questions were addressed. Maximal Sterile Barrier Technique was utilized including caps, mask, sterile gowns, sterile gloves, sterile drape, hand hygiene and skin antiseptic. A timeout was performed prior to the initiation of the procedure. The patient was placed supine on the exam table. The left neck and chest was prepped and draped in the standard sterile fashion. A preliminary ultrasound of the left neck was performed and demonstrates a patent left internal jugular vein. A permanent ultrasound image was stored in the electronic medical record. The overlying skin was anesthetized with 1%  Lidocaine. Using ultrasound guidance, access was obtained into the left internal jugular vein using a 21 gauge micropuncture set. A wire was advanced into the SVC, a short incision was made at the puncture site, and serial dilatation performed. Next, in an ipsilateral infraclavicular location, an incision was made at the site of the subcutaneous reservoir. Blunt dissection was used to open a pocket to contain the reservoir. A subcutaneous tunnel was then created from the port site to the puncture site. A(n) 8 Fr single lumen catheter was advanced through the tunnel. The catheter was attached to the port and this was placed in the subcutaneous pocket. Under fluoroscopic guidance, a peel away sheath was placed, and the catheter was trimmed to the appropriate length and was advanced into the central veins. The  catheter length is 28 cm. The tip of the catheter lies near the superior cavoatrial junction. The port flushes and aspirates appropriately. The port was flushed and locked with heparinized saline. The port pocket was closed in 2 layers using 3-0 and 4-0 Vicryl/absorbable suture. Dermabond was also applied to both incisions. The patient tolerated the procedure well and was transferred to recovery in stable condition. IMPRESSION: Successful placement of a left-sided chest port via the left internal jugular vein. The catheter tip terminates at the superior cavoatrial junction. The port is ready for immediate use. Electronically Signed   By: Olive Bass M.D.   On: 07/30/2023 14:46   IR Radiologist Eval & Mgmt  Result Date: 07/20/2023 EXAM: ESTABLISHED PATIENT OFFICE VISIT CHIEF COMPLAINT: Refer to EMR HISTORY OF PRESENT ILLNESS: The patient had a right chest port placed on May 26, 2023, and presented to the emergency room on June 12, 2023 with concern for port site infection and sepsis. The port was subsequently removed by general surgery on June 14, 2023. Personal review of the chart demonstrated that  both blood cultures and wound cultures were negative for any microorganisms. Patient reports that the chest wound was left open, and was healing by secondary intent with a home health nurse packing the wound. On today, he reports no systemic symptoms of infection. He denies fevers or chills. Physical examination of the right chest wound demonstrates near-complete re-epithelialization of the chest wound. There is no erythema, fluctuance or tenderness. The patient is interested in having another chest port placed for continuing therapy. Can plan for likely left-sided chest port placement at [facility] next week. REVIEW OF SYSTEMS: Refer to EMR PHYSICAL EXAMINATION: Refer to EMR ASSESSMENT AND PLAN: Refer to EMR Electronically Signed   By: Olive Bass M.D.   On: 07/20/2023 10:38

## 2023-08-16 NOTE — Patient Instructions (Signed)
[place] [facility]  Discharge Instructions: Thank you for choosing [facility] to provide your oncology and hematology care.  If you have a lab appointment with the Cancer Center, please go directly to the Cancer Center and check in at the registration area.  Wear comfortable clothing and clothing appropriate for easy access to any Portacath or PICC line.   We strive to give you quality time with your provider. You may need to reschedule your appointment if you arrive late (15 or more minutes).  Arriving late affects you and other patients whose appointments are after yours.  Also, if you miss three or more appointments without notifying the office, you may be dismissed from the clinic at the provider's discretion.      For prescription refill requests, have your pharmacy contact our office and allow 72 hours for refills to be completed.    Today you received the following chemotherapy and/or immunotherapy agents keytruda, taxol, and carboplatin      To help prevent nausea and vomiting after your treatment, we encourage you to take your nausea medication as directed.  BELOW ARE SYMPTOMS THAT SHOULD BE REPORTED IMMEDIATELY: *FEVER GREATER THAN 100.4 F (38 C) OR HIGHER *CHILLS OR SWEATING *NAUSEA AND VOMITING THAT IS NOT CONTROLLED WITH YOUR NAUSEA MEDICATION *UNUSUAL SHORTNESS OF BREATH *UNUSUAL BRUISING OR BLEEDING *URINARY PROBLEMS (pain or burning when urinating, or frequent urination) *BOWEL PROBLEMS (unusual diarrhea, constipation, pain near the anus) TENDERNESS IN MOUTH AND THROAT WITH OR WITHOUT PRESENCE OF ULCERS (sore throat, sores in mouth, or a toothache) UNUSUAL RASH, SWELLING OR PAIN  UNUSUAL VAGINAL DISCHARGE OR ITCHING   Items with * indicate a potential emergency and should be followed up as soon as possible or go to the Emergency Department if any problems should occur.  Please show the CHEMOTHERAPY ALERT CARD or IMMUNOTHERAPY  ALERT CARD at check-in to the Emergency Department and triage nurse.  Should you have questions after your visit or need to cancel or reschedule your appointment, please contact [place] [facility]  [phone] and follow the prompts.  Office hours are 8:00 a.m. to 4:30 p.m. Monday - Friday. Please note that voicemails left after 4:00 p.m. may not be returned until the following business day.  We are closed weekends and major holidays. You have access to a nurse at all times for urgent questions. Please call the main number to the clinic [phone] and follow the prompts.  For any non-urgent questions, you may also contact your provider using MyChart. We now offer e-Visits for anyone 28 and older to request care online for non-urgent symptoms. For details visit [email or web address].   Also download the MyChart app! Go to the app store, search "MyChart", open the app, select [place], and log in with your MyChart username and password.

## 2023-08-17 ENCOUNTER — Encounter: Payer: Self-pay | Admitting: *Deleted

## 2023-08-18 ENCOUNTER — Inpatient Hospital Stay: Payer: Medicare HMO

## 2023-08-19 NOTE — Addendum Note (Signed)
Encounter addended by: Edward Qualia on: 08/19/2023 12:19 PM  Actions taken: Imaging Exam ended

## 2023-08-26 NOTE — Progress Notes (Signed)
Viral panel ordered as possible etiology of patient's weakness.

## 2023-08-30 ENCOUNTER — Other Ambulatory Visit: Payer: Self-pay | Admitting: *Deleted

## 2023-08-30 ENCOUNTER — Ambulatory Visit
Admission: RE | Admit: 2023-08-30 | Discharge: 2023-08-30 | Disposition: A | Discharge status: Home / Self Care | Payer: Medicare HMO | Source: Ambulatory Visit | Attending: Internal Medicine | Admitting: Internal Medicine

## 2023-08-30 ENCOUNTER — Inpatient Hospital Stay: Payer: Medicare HMO

## 2023-08-30 DIAGNOSIS — C3491 Malignant neoplasm of unspecified part of right bronchus or lung: Secondary | ICD-10-CM | POA: Diagnosis present

## 2023-08-30 MED ORDER — IOHEXOL 300 MG/ML  SOLN
75.0000 mL | Freq: Once | INTRAMUSCULAR | Status: AC | PRN
  Administered 2023-08-30: 75 mL via INTRAVENOUS

## 2023-08-31 ENCOUNTER — Encounter: Payer: Self-pay | Admitting: Internal Medicine

## 2023-08-31 MED ORDER — OXYCODONE HCL 5 MG PO TABS: 5.0000 mg | ORAL_TABLET | Freq: Three times a day (TID) | ORAL | 0 refills | Status: DC | PRN

## 2023-09-06 ENCOUNTER — Inpatient Hospital Stay: Payer: Medicare HMO

## 2023-09-06 ENCOUNTER — Inpatient Hospital Stay: Payer: Medicare HMO | Attending: Internal Medicine

## 2023-09-06 ENCOUNTER — Inpatient Hospital Stay (HOSPITAL_BASED_OUTPATIENT_CLINIC_OR_DEPARTMENT_OTHER): Payer: Medicare HMO | Admitting: Internal Medicine

## 2023-09-06 ENCOUNTER — Encounter: Payer: Self-pay | Admitting: Internal Medicine

## 2023-09-06 VITALS — BP 150/80 | HR 81 | Temp 97.3°F | Wt 192.0 lb

## 2023-09-06 DIAGNOSIS — Z87891 Personal history of nicotine dependence: Secondary | ICD-10-CM | POA: Diagnosis not present

## 2023-09-06 DIAGNOSIS — Z7902 Long term (current) use of antithrombotics/antiplatelets: Secondary | ICD-10-CM | POA: Insufficient documentation

## 2023-09-06 DIAGNOSIS — D509 Iron deficiency anemia, unspecified: Secondary | ICD-10-CM | POA: Diagnosis not present

## 2023-09-06 DIAGNOSIS — C3491 Malignant neoplasm of unspecified part of right bronchus or lung: Secondary | ICD-10-CM

## 2023-09-06 DIAGNOSIS — G893 Neoplasm related pain (acute) (chronic): Secondary | ICD-10-CM | POA: Insufficient documentation

## 2023-09-06 DIAGNOSIS — Z7982 Long term (current) use of aspirin: Secondary | ICD-10-CM | POA: Diagnosis not present

## 2023-09-06 DIAGNOSIS — Z5112 Encounter for antineoplastic immunotherapy: Secondary | ICD-10-CM

## 2023-09-06 DIAGNOSIS — I2510 Atherosclerotic heart disease of native coronary artery without angina pectoris: Secondary | ICD-10-CM | POA: Insufficient documentation

## 2023-09-06 DIAGNOSIS — Z79899 Other long term (current) drug therapy: Secondary | ICD-10-CM | POA: Insufficient documentation

## 2023-09-06 DIAGNOSIS — E871 Hypo-osmolality and hyponatremia: Secondary | ICD-10-CM | POA: Diagnosis not present

## 2023-09-06 DIAGNOSIS — D649 Anemia, unspecified: Secondary | ICD-10-CM | POA: Diagnosis not present

## 2023-09-06 DIAGNOSIS — C342 Malignant neoplasm of middle lobe, bronchus or lung: Secondary | ICD-10-CM | POA: Insufficient documentation

## 2023-09-06 LAB — CMP (CANCER CENTER ONLY)
ALT: 37 U/L (ref 0–44)
AST: 25 U/L (ref 15–41)
Albumin: 4 g/dL (ref 3.5–5.0)
Alkaline Phosphatase: 53 U/L (ref 38–126)
Anion gap: 9 (ref 5–15)
BUN: 15 mg/dL (ref 8–23)
CO2: 28 mmol/L (ref 22–32)
Calcium: 7.6 mg/dL — ABNORMAL LOW (ref 8.9–10.3)
Chloride: 98 mmol/L (ref 98–111)
Creatinine: 0.68 mg/dL (ref 0.61–1.24)
GFR, Estimated: >60 mL/min (ref >60)
Glucose, Bld: 125 mg/dL — ABNORMAL HIGH (ref 70–99)
Potassium: 3.5 mmol/L (ref 3.5–5.1)
Sodium: 135 mmol/L (ref 135–145)
Total Bilirubin: 0.7 mg/dL (ref <1.2)
Total Protein: 6.5 g/dL (ref 6.5–8.1)

## 2023-09-06 LAB — CBC WITH DIFFERENTIAL (CANCER CENTER ONLY)
Abs Immature Granulocytes: 0.09 10*3/uL — ABNORMAL HIGH (ref 0.00–0.07)
Basophils Absolute: 0 10*3/uL (ref 0.0–0.1)
Basophils Relative: 0 %
Eosinophils Absolute: 0 10*3/uL (ref 0.0–0.5)
Eosinophils Relative: 0 %
HCT: 33.3 % — ABNORMAL LOW (ref 39.0–52.0)
Hemoglobin: 10.4 g/dL — ABNORMAL LOW (ref 13.0–17.0)
Immature Granulocytes: 1 %
Lymphocytes Relative: 14 %
Lymphs Abs: 1.3 10*3/uL (ref 0.7–4.0)
MCH: 30 pg (ref 26.0–34.0)
MCHC: 31.2 g/dL (ref 30.0–36.0)
MCV: 96 fL (ref 80.0–100.0)
Monocytes Absolute: 1 10*3/uL (ref 0.1–1.0)
Monocytes Relative: 10 %
Neutro Abs: 7.4 10*3/uL (ref 1.7–7.7)
Neutrophils Relative %: 75 %
Platelet Count: 153 10*3/uL (ref 150–400)
RBC: 3.47 MIL/uL — ABNORMAL LOW (ref 4.22–5.81)
RDW: 22.5 % — ABNORMAL HIGH (ref 11.5–15.5)
WBC Count: 9.9 10*3/uL (ref 4.0–10.5)
nRBC: 0 % (ref 0.0–0.2)

## 2023-09-06 LAB — VITAMIN D 25 HYDROXY (VIT D DEFICIENCY, FRACTURES): Vit D, 25-Hydroxy: 41.62 ng/mL (ref 30–100)

## 2023-09-06 MED ORDER — HEPARIN SOD (PORK) LOCK FLUSH 100 UNIT/ML IV SOLN
500.0000 [IU] | Freq: Once | INTRAVENOUS | Status: AC | PRN
  Administered 2023-09-06: 500 [IU]
  Filled 2023-09-06: qty 5

## 2023-09-06 MED ORDER — SODIUM CHLORIDE 0.9 % IV SOLN
Freq: Once | INTRAVENOUS | Status: AC
  Filled 2023-09-06: qty 250

## 2023-09-06 MED ORDER — IRON SUCROSE 20 MG/ML IV SOLN
200.0000 mg | Freq: Once | INTRAVENOUS | Status: AC
  Administered 2023-09-06: 200 mg via INTRAVENOUS
  Filled 2023-09-06: qty 10

## 2023-09-06 MED ORDER — SODIUM CHLORIDE 0.9% FLUSH
10.0000 mL | Freq: Once | INTRAVENOUS | Status: AC | PRN
  Administered 2023-09-06: 10 mL
  Filled 2023-09-06: qty 10

## 2023-09-06 MED ORDER — SODIUM CHLORIDE 0.9 % IV SOLN
200.0000 mg | Freq: Once | INTRAVENOUS | Status: AC
  Administered 2023-09-06: 200 mg via INTRAVENOUS
  Filled 2023-09-06: qty 200

## 2023-09-06 NOTE — Progress Notes (Signed)
[facility] CONSULT NOTE  Patient Care Team: Pcp, No as PCP - General Debbe Odea, MD as PCP - Cardiology (Cardiology) Salena Saner, MD as Consulting Physician (Pulmonary Disease) Glory Buff, RN as Oncology Nurse Navigator Michaelyn Barter, MD as Consulting Physician (Oncology)   CANCER STAGING   Cancer Staging  Stage IV adenocarcinoma of lung [facility]) Staging form: Lung, AJCC 8th Edition - Clinical: Stage IV (cT4, cN3, cM1) - Signed by Michaelyn Barter, MD on 05/20/2023 Stage prefix: Initial diagnosis   ASSESSMENT & PLAN:  Austin Patterson 67 y.o. 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  Pathology showed poorly differentiated carcinoma with spindle cell component.  Tumor cells positive for CK and focal TTF-1.  The differential diagnosis includes pleomorphic carcinoma and carcinosarcoma.  Definitive classification cannot be performed on the biopsy specimen.  -Tempus testing showed PD-L1 0% with no targetable mutations.  -Patient declined second opinion with [facility] or [facility] due to transportation issues/social support.  Considering there was a carcinoma component, we decided to treat like lung cancer.  -Started on dose reduced carboplatin, Taxol and Keytruda on 06/01/2023.  Poor functional status/ poor tolerance with two hospitalizations for sepsis/ and second for weakness.  -CT chest abdomen pelvis post 4 cycles of  CarboTaxol showed response to treatment. Right lower lobe nodule is slightly increased in size from 12 x 12 mm to 18 x 13 mm.  Rest have all decreased in size.  Will continue to monitor.  Will start him on maintenance Keytruda every 3 weeks.  Labs reviewed and acceptable for treatment.  # Chronic respiratory failure on 2 L oxygen # Asthma and COPD overlap # Idiopathic pulmonary fibrosis -Management per pulmonary.  On Dupixent  # Hypocalcemia -Calcium 7.6.  Will add vitamin D level. -Advised the patient to start on calcium vitamin D supplements.  # Hyponatremia -Chronic in nature.  Sodium 129 at baseline.  # Normocytic anemia -Iron panel consistent with iron deficiency. Also chemotherapy component. -Proceed with IV Venofer 200 mg.  Today is #3.  # Cancer related pain -In mid chest.  Continue with oxycodone 5 mg Q8 as needed.   #CAD- on aspirin, plavix  # IV access-port removed on 06/14/23 due to sepsis.   -Inserted on left side on 07/30/23.  Orders Placed This Encounter  Procedures   CBC with Differential (Cancer Center Only)    Standing Status:   Future    Standing Expiration Date:   09/26/2024   CMP (Cancer Center only)    Standing Status:   Future    Standing Expiration Date:   09/26/2024   T4    Standing Status:   Future    Standing Expiration Date:   09/26/2024   TSH    Standing Status:   Future    Standing Expiration Date:   09/26/2024   Vitamin D 25 hydroxy    Standing Status:   Future    Standing Expiration Date:   09/05/2024   RTC in 3 weeks for MD visit, labs,  cycle 6 keytruda, venofer  The total time spent in the appointment was 30 minutes encounter with patients including review of chart and various tests results, discussions about plan of care and coordination of care plan   All questions were answered. The patient knows to call the clinic with any problems, questions or concerns. No barriers to learning was detected.  Michaelyn Barter, MD 11/18/20249:50  AM   HISTORY OF PRESENTING ILLNESS:  Austin Patterson 67 y.o. 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  Patient was seen today in the clinic for further evaluation.  He is a remote smoker quit in 2016.  Prior smoked 2 packs a day since age 34.  He is on 2 L oxygen since 2017.  Lives alone.  Was in a wheelchair.  Does not have family close by.  Follows with Dr. Jayme Cloud.   Interval history Patient was seen today as follow-up for labs, discuss scans and cycle 5 of Keytruda. Denies any concerns today.  Energy level tends to fluctuate.  Denies any nausea or vomiting.  Has started iron pills and reports feeling better.  I have reviewed his chart and materials related to his cancer extensively and collaborated history with the patient. Summary of oncologic history is as follows:  Oncology History  Stage IV adenocarcinoma of lung (HCC)  05/18/2023 Initial Diagnosis   Sarcomatoid carcinoma of lung (HCC)   05/20/2023 Cancer Staging   Staging form: Lung, AJCC 8th Edition - Clinical: Stage IV (cT4, cN3, cM1) - Signed by Michaelyn Barter, MD on 05/20/2023 Stage prefix: Initial diagnosis   06/01/2023 -  Chemotherapy   Patient is on Treatment Plan : LUNG NSCLC Carboplatin (6) + Paclitaxel (200) + Pembrolizumab (200) D1 q21d x 4 cycles / Pembrolizumab (200) Maintenance D1 q21d       MEDICAL HISTORY:  Past Medical History:  Diagnosis Date   Abdominal pain 12/07/2022   Acute urinary retention 12/07/2022   Chest pain 01/24/2022   COPD (chronic obstructive pulmonary disease) (HCC)    GERD  (gastroesophageal reflux disease)    Hemothorax on right 05/11/2023   Hyperlipidemia    Hypertension    Pleuritic pain 05/10/2023   Pneumothorax after biopsy 05/11/2023   Post procedure discomfort 05/13/2023   Pulmonary fibrosis (HCC) 11/2015    SURGICAL HISTORY: Past Surgical History:  Procedure Laterality Date   COLONOSCOPY     CORONARY STENT PLACEMENT     ESOPHAGOGASTRODUODENOSCOPY (EGD) WITH PROPOFOL N/A 09/23/2016   Procedure: ESOPHAGOGASTRODUODENOSCOPY (EGD) WITH PROPOFOL;  Surgeon: Wyline Mood, MD;  Location: [facility] ENDOSCOPY;  Service: Endoscopy;  Laterality: N/A;   IR IMAGING GUIDED PORT INSERTION  05/26/2023   IR IMAGING GUIDED PORT INSERTION  07/30/2023   IR RADIOLOGIST EVAL & MGMT  07/20/2023   KYPHOPLASTY N/A 03/14/2020   Procedure: T7 & T11 KYPHOPLASTY;  Surgeon: Kennedy Bucker, MD;  Location: [facility] ORS;  Service: Orthopedics;  Laterality: N/A;   PORT-A-CATH REMOVAL N/A 06/14/2023   Procedure: REMOVAL PORT-A-CATH;  Surgeon: Sung Amabile, DO;  Location: [facility] ORS;  Service: General;  Laterality: N/A;   SHOULDER ACROMIOPLASTY      SOCIAL HISTORY: Social History   Socioeconomic History  Marital status: Legally Separated    Spouse name: Not on file   Number of children: Not on file   Years of education: Not on file   Highest education level: Not on file  Occupational History   Not on file  Tobacco Use   Smoking status: Former    Current packs/day: 0.00    Average packs/day: 2.0 packs/day for 50.0 years (100.0 ttl pk-yrs)    Types: Cigarettes    Start date: 10/13/1965    Quit date: 10/14/2015    Years since quitting: 7.9   Smokeless tobacco: Former  Building services engineer status: Never Used  Substance and Sexual Activity   Alcohol use: Yes    Alcohol/week: 56.0 standard drinks of alcohol    Types: 56 Cans of beer per week    Comment: "I sit around and drink beer, that's all I got to do"   Drug use: No   Sexual activity: Not Currently  Other Topics Concern   Not on  file  Social History Narrative   Not on file   Social Determinants of Health   Financial Resource Strain: Not on file  Food Insecurity: No Food Insecurity (06/12/2023)   Hunger Vital Sign    Worried About Running Out of Food in the Last Year: Never true    Ran Out of Food in the Last Year: Never true  Transportation Needs: Unmet Transportation Needs (08/04/2023)   PRAPARE - Administrator, Civil Service (Medical): Yes    Lack of Transportation (Non-Medical): No  Physical Activity: Not on file  Stress: Not on file  Social Connections: Not on file  Intimate Partner Violence: Not At Risk (06/12/2023)   Humiliation, Afraid, Rape, and Kick questionnaire    Fear of Current or Ex-Partner: No    Emotionally Abused: No    Physically Abused: No    Sexually Abused: No    FAMILY HISTORY: Family History  Problem Relation Age of Onset   Heart disease Mother     ALLERGIES:  is allergic to amoxicillin and tizanidine.  MEDICATIONS:  Current Outpatient Medications  Medication Sig Dispense Refill   acetaminophen (TYLENOL) 500 MG tablet Take 1,000 mg by mouth every 6 (six) hours as needed for moderate pain or headache.     albuterol (VENTOLIN HFA) 108 (90 Base) MCG/ACT inhaler Inhale 2 puffs into the lungs every 6 (six) hours as needed for wheezing or shortness of breath. 8 g 11   amLODipine (NORVASC) 5 MG tablet Take 1 tablet (5 mg total) by mouth at bedtime. Skip the dose if systolic BP less than 130 mmHg 30 tablet 5   aspirin EC 81 MG tablet Take 1 tablet (81 mg total) by mouth daily. 90 tablet 3   atorvastatin (LIPITOR) 40 MG tablet Take 1 tablet (40 mg total) by mouth at bedtime. 90 tablet 3   CALCIUM 600/VITAMIN D 600-10 MG-MCG TABS Take 1 tablet by mouth 2 (two) times daily.     clopidogrel (PLAVIX) 75 MG tablet Take 1 tablet (75 mg total) by mouth daily. 90 tablet 3   Dupilumab (DUPIXENT) 300 MG/2ML SOPN Inject 300 mg into the skin every 14 (fourteen) days. 12 mL 1    EPINEPHrine 0.3 mg/0.3 mL IJ SOAJ injection Inject 0.3 mg into the muscle as needed for anaphylaxis.     ferrous sulfate 325 (65 FE) MG tablet Take 1 tablet (325 mg total) by mouth 2 (two) times daily with a meal. 60 tablet 2  fluticasone-salmeterol (ADVAIR DISKUS) 250-50 MCG/ACT AEPB Inhale 1 puff into the lungs in the morning and at bedtime. 60 each 11   gabapentin (NEURONTIN) 300 MG capsule TAKE 1 CAPSULE BY MOUTH THREE TIMES A DAY 90 capsule 1   guaiFENesin (ROBITUSSIN) 100 MG/5ML liquid Take 5 mLs by mouth every 4 (four) hours as needed for cough or to loosen phlegm. 236 mL 1   guaiFENesin-dextromethorphan (ROBITUSSIN DM) 100-10 MG/5ML syrup Take 5 mLs by mouth every 4 (four) hours as needed for cough. 118 mL 0   ipratropium-albuterol (DUONEB) 0.5-2.5 (3) MG/3ML SOLN INHALE 1 VIAL THROUGH NEBULIZER EVERY 6 HOURS 270 mL 2   lidocaine-prilocaine (EMLA) cream Apply to affected area once 30 g 3   magnesium oxide (MAG-OX) 400 (241.3 Mg) MG tablet Take 1 tablet (400 mg total) by mouth daily. 30 tablet 0   metoprolol succinate (TOPROL-XL) 100 MG 24 hr tablet Take 1 tablet (100 mg total) by mouth daily. 30 tablet 11   montelukast (SINGULAIR) 10 MG tablet Take 10 mg by mouth daily.     Multiple Vitamin (MULTIVITAMIN WITH MINERALS) TABS tablet Take 1 tablet by mouth daily. 30 tablet 0   ondansetron (ZOFRAN) 8 MG tablet Take 1 tablet (8 mg total) by mouth every 8 (eight) hours as needed for nausea or vomiting. Start on the third day after carboplatin. 30 tablet 1   oxyCODONE (OXY IR/ROXICODONE) 5 MG immediate release tablet Take 1 tablet (5 mg total) by mouth every 8 (eight) hours as needed for severe pain (pain score 7-10). 90 tablet 0   pantoprazole (PROTONIX) 40 MG tablet Take 1 tablet (40 mg total) by mouth 2 (two) times daily. 90 tablet 3   prochlorperazine (COMPAZINE) 10 MG tablet Take 1 tablet (10 mg total) by mouth every 6 (six) hours as needed for nausea or vomiting. 30 tablet 1   senna-docusate  (SENOKOT-S) 8.6-50 MG tablet Take 1 tablet by mouth 2 (two) times daily. 30 tablet 1   Spacer/Aero-Holding Chambers (AEROCHAMBER MV) inhaler Use as instructed 1 each 0   No current facility-administered medications for this visit.    REVIEW OF SYSTEMS:   Pertinent information mentioned in HPI All other systems were reviewed with the patient and are negative.  PHYSICAL EXAMINATION: ECOG PERFORMANCE STATUS: 3   Vitals:   09/06/23 0921  BP: (!) 150/80  Pulse: 81  Temp: (!) 97.3 F (36.3 C)  SpO2: 99%    Filed Weights   09/06/23 0921  Weight: 192 lb (87.1 kg)     GENERAL:alert, no distress and comfortable SKIN: skin color, texture, turgor are normal, no rashes or significant lesions EYES: normal, conjunctiva are pink and non-injected, sclera clear OROPHARYNX:no exudate, no erythema and lips, buccal mucosa, and tongue normal  NECK: supple, thyroid normal size, non-tender, without nodularity LYMPH:  no palpable lymphadenopathy in the cervical, axillary or inguinal LUNGS: clear to auscultation and percussion with normal breathing effort HEART: regular rate & rhythm and no murmurs and no lower extremity edema ABDOMEN:abdomen soft, non-tender and normal bowel sounds Musculoskeletal:no cyanosis of digits and no clubbing  PSYCH: alert & oriented x 3 with fluent speech NEURO: no focal motor/sensory deficits  LABORATORY DATA:  I have reviewed the data as listed Lab Results  Component Value Date   WBC 9.9 09/06/2023   HGB 10.4 (L) 09/06/2023   HCT 33.3 (L) 09/06/2023   MCV 96.0 09/06/2023   PLT 153 09/06/2023   Recent Labs    06/16/23 0824 06/17/23 0329 08/06/23 1038  08/07/23 0517 08/08/23 0524 08/16/23 0935 09/06/23 0907  NA 133*   < > 131*   < > 132* 134* 135  K 3.5   < > 2.9*   < > 3.7 3.6 3.5  CL 98   < > 90*   < > 98 96* 98  CO2 28   < > 25   < > 25 29 28   GLUCOSE 152*   < > 139*   < > 112* 111* 125*  BUN 10   < > 8   < > 11 10 15   CREATININE 0.55*   < >  0.59*   < > 0.50* 0.61 0.68  CALCIUM 7.0*   < > 7.1*   < > 6.7* 8.4* 7.6*  GFRNONAA >60   < > >60   < > >60 >60 >60  PROT 6.5   < > 7.0  --   --  7.1 6.5  ALBUMIN 2.9*   < > 3.5  --   --  3.6 4.0  AST 18   < > 15  --   --  18 25  ALT 25   < > 18  --   --  18 37  ALKPHOS 87   < > 115  --   --  80 53  BILITOT 0.3   < > 0.6  --   --  0.3 0.7  BILIDIR <0.1  --   --   --   --   --   --   IBILI NOT CALCULATED  --   --   --   --   --   --    < > = values in this interval not displayed.    RADIOGRAPHIC STUDIES: I have personally reviewed the radiological images as listed and agreed with the findings in the report. CT CHEST W CONTRAST  Result Date: 09/06/2023 CLINICAL DATA:  Lung cancer EXAM: CT CHEST WITH CONTRAST TECHNIQUE: Multidetector CT imaging of the chest was performed during intravenous contrast administration. RADIATION DOSE REDUCTION: This exam was performed according to the departmental dose-optimization program which includes automated exposure control, adjustment of the mA and/or kV according to patient size and/or use of iterative reconstruction technique. CONTRAST:  75mL OMNIPAQUE IOHEXOL 300 MG/ML  SOLN COMPARISON:  06/19/2023 FINDINGS: Cardiovascular: The heart is normal in size. No pericardial effusion. No evidence of thoracic aortic aneurysm. Atherosclerotic calcifications of the aortic arch. Severe three-vessel coronary atherosclerosis. Left chest port terminates in the lower SVC. Mediastinum/Nodes: Small mediastinal and right perihilar nodes, including a dominant 11 mm short axis right paratracheal node (series 2/image 83), previously 13 mm. Visualized thyroid is unremarkable. Lungs/Pleura: Moderate centrilobular and paraseptal emphysematous changes with large bulla in the lateral right hemithorax. 4.8 x 3.0 cm inferior right middle lobe mass (series 4/image 96), previously 7.1 x 5.9 cm. 18 x 13 mm irregular nodular opacity in the right lower lobe (series 4/image 90), previously 12 x  12 mm. 19 x 11 mm irregular nodule at the right lung base (series 4/image 117), previously 22 x 12 mm when measured in a similar fashion, improved. 8 mm nodule at the left lung base (series 4/image 118), previously 14 mm, improved. No focal consolidation. Scarring with bronchiectasis in the medial left lung apex (series 4/image 22), new. No pleural effusion or pneumothorax. Upper Abdomen: Visualized upper abdomen is notable for stable low-density bilateral renal nodules, previously characterized as benign adrenal adenomas. Vascular calcifications. Musculoskeletal: Prior vertebral augmentation at T7 and T11. Mild  degenerative changes of the visualized thoracolumbar spine. Chronic right scapular deformity. IMPRESSION: Improving right middle lobe mass, corresponding to the patient's known primary bronchogenic carcinoma. Small mediastinal and right perihilar nodal metastases are also improving. Associated bilateral lower lobe pulmonary metastases, overall improved, although one right lower lobe nodular opacity is mildly progressive. Attention on follow-up is suggested. Stable bilateral adrenal adenomas. Aortic Atherosclerosis (ICD10-I70.0) and Emphysema (ICD10-J43.9). Electronically Signed   By: Charline Bills M.D.   On: 09/06/2023 09:26

## 2023-09-06 NOTE — Patient Instructions (Signed)
Please start calcium and Vit D supplements once daily  Calcium [phone] mg  Vit D - 1000 U

## 2023-09-24 ENCOUNTER — Encounter: Payer: Self-pay | Admitting: Internal Medicine

## 2023-09-27 ENCOUNTER — Inpatient Hospital Stay: Payer: Medicare HMO

## 2023-09-27 ENCOUNTER — Inpatient Hospital Stay: Payer: Medicare HMO | Attending: Internal Medicine

## 2023-09-27 ENCOUNTER — Encounter: Payer: Self-pay | Admitting: Internal Medicine

## 2023-09-27 ENCOUNTER — Inpatient Hospital Stay (HOSPITAL_BASED_OUTPATIENT_CLINIC_OR_DEPARTMENT_OTHER): Payer: Medicare HMO | Admitting: Internal Medicine

## 2023-09-27 VITALS — BP 130/72 | HR 92 | Temp 98.2°F | Resp 20 | Wt 192.0 lb

## 2023-09-27 DIAGNOSIS — Z7982 Long term (current) use of aspirin: Secondary | ICD-10-CM | POA: Insufficient documentation

## 2023-09-27 DIAGNOSIS — Z5112 Encounter for antineoplastic immunotherapy: Secondary | ICD-10-CM | POA: Diagnosis present

## 2023-09-27 DIAGNOSIS — Z7902 Long term (current) use of antithrombotics/antiplatelets: Secondary | ICD-10-CM | POA: Insufficient documentation

## 2023-09-27 DIAGNOSIS — Z87891 Personal history of nicotine dependence: Secondary | ICD-10-CM | POA: Diagnosis not present

## 2023-09-27 DIAGNOSIS — D509 Iron deficiency anemia, unspecified: Secondary | ICD-10-CM | POA: Diagnosis not present

## 2023-09-27 DIAGNOSIS — G893 Neoplasm related pain (acute) (chronic): Secondary | ICD-10-CM | POA: Insufficient documentation

## 2023-09-27 DIAGNOSIS — D649 Anemia, unspecified: Secondary | ICD-10-CM | POA: Diagnosis not present

## 2023-09-27 DIAGNOSIS — I2510 Atherosclerotic heart disease of native coronary artery without angina pectoris: Secondary | ICD-10-CM | POA: Diagnosis not present

## 2023-09-27 DIAGNOSIS — C3491 Malignant neoplasm of unspecified part of right bronchus or lung: Secondary | ICD-10-CM

## 2023-09-27 DIAGNOSIS — C342 Malignant neoplasm of middle lobe, bronchus or lung: Secondary | ICD-10-CM | POA: Diagnosis present

## 2023-09-27 DIAGNOSIS — E871 Hypo-osmolality and hyponatremia: Secondary | ICD-10-CM | POA: Insufficient documentation

## 2023-09-27 LAB — TSH: TSH: 1.437 u[IU]/mL (ref 0.350–4.500)

## 2023-09-27 LAB — CBC WITH DIFFERENTIAL (CANCER CENTER ONLY)
Abs Immature Granulocytes: 0.01 10*3/uL (ref 0.00–0.07)
Basophils Absolute: 0 10*3/uL (ref 0.0–0.1)
Basophils Relative: 0 %
Eosinophils Absolute: 0.1 10*3/uL (ref 0.0–0.5)
Eosinophils Relative: 3 %
HCT: 35.3 % — ABNORMAL LOW (ref 39.0–52.0)
Hemoglobin: 11.4 g/dL — ABNORMAL LOW (ref 13.0–17.0)
Immature Granulocytes: 0 %
Lymphocytes Relative: 13 %
Lymphs Abs: 0.6 10*3/uL — ABNORMAL LOW (ref 0.7–4.0)
MCH: 32.5 pg (ref 26.0–34.0)
MCHC: 32.3 g/dL (ref 30.0–36.0)
MCV: 100.6 fL — ABNORMAL HIGH (ref 80.0–100.0)
Monocytes Absolute: 0.6 10*3/uL (ref 0.1–1.0)
Monocytes Relative: 12 %
Neutro Abs: 3.6 10*3/uL (ref 1.7–7.7)
Neutrophils Relative %: 72 %
Platelet Count: 242 10*3/uL (ref 150–400)
RBC: 3.51 MIL/uL — ABNORMAL LOW (ref 4.22–5.81)
RDW: 19.6 % — ABNORMAL HIGH (ref 11.5–15.5)
WBC Count: 5 10*3/uL (ref 4.0–10.5)
nRBC: 0 % (ref 0.0–0.2)

## 2023-09-27 LAB — CMP (CANCER CENTER ONLY)
ALT: 19 U/L (ref 0–44)
AST: 19 U/L (ref 15–41)
Albumin: 4.2 g/dL (ref 3.5–5.0)
Alkaline Phosphatase: 48 U/L (ref 38–126)
Anion gap: 10 (ref 5–15)
BUN: 15 mg/dL (ref 8–23)
CO2: 25 mmol/L (ref 22–32)
Calcium: 8.8 mg/dL — ABNORMAL LOW (ref 8.9–10.3)
Chloride: 100 mmol/L (ref 98–111)
Creatinine: 0.73 mg/dL (ref 0.61–1.24)
GFR, Estimated: >60 mL/min (ref >60)
Glucose, Bld: 138 mg/dL — ABNORMAL HIGH (ref 70–99)
Potassium: 3.5 mmol/L (ref 3.5–5.1)
Sodium: 135 mmol/L (ref 135–145)
Total Bilirubin: 0.6 mg/dL (ref <1.2)
Total Protein: 6.9 g/dL (ref 6.5–8.1)

## 2023-09-27 MED ORDER — IRON SUCROSE 20 MG/ML IV SOLN
200.0000 mg | Freq: Once | INTRAVENOUS | Status: AC
  Administered 2023-09-27: 200 mg via INTRAVENOUS

## 2023-09-27 MED ORDER — SODIUM CHLORIDE 0.9 % IV SOLN
Freq: Once | INTRAVENOUS | Status: AC
Start: 2023-09-27 — End: 2023-09-27
  Filled 2023-09-27: qty 250

## 2023-09-27 MED ORDER — HEPARIN SOD (PORK) LOCK FLUSH 100 UNIT/ML IV SOLN
500.0000 [IU] | Freq: Once | INTRAVENOUS | Status: AC | PRN
  Administered 2023-09-27: 500 [IU]
  Filled 2023-09-27: qty 5

## 2023-09-27 MED ORDER — SODIUM CHLORIDE 0.9 % IV SOLN
200.0000 mg | Freq: Once | INTRAVENOUS | Status: AC
  Administered 2023-09-27: 200 mg via INTRAVENOUS
  Filled 2023-09-27: qty 8

## 2023-09-27 NOTE — Progress Notes (Signed)
Austin Patterson CONSULT NOTE  Patient Care Team: Pcp, No as PCP - General Debbe Odea, MD as PCP - Cardiology (Cardiology) Salena Saner, MD as Consulting Physician (Pulmonary Disease) Glory Buff, RN as Oncology Nurse Navigator Michaelyn Barter, MD as Consulting Physician (Oncology)   CANCER STAGING   Cancer Staging  Stage IV adenocarcinoma of lung [facility]) Staging form: Lung, AJCC 8th Edition - Clinical: Stage IV (cT4, cN3, cM1) - Signed by Michaelyn Barter, MD on 05/20/2023 Stage prefix: Initial diagnosis   ASSESSMENT & PLAN:  Austin Patterson 67 y.o. 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  Pathology showed poorly differentiated carcinoma with spindle cell component.  Tumor cells positive for CK and focal TTF-1.  The differential diagnosis includes pleomorphic carcinoma and carcinosarcoma.  Definitive classification cannot be performed on the biopsy specimen.  -Tempus testing showed PD-L1 0% with no targetable mutations.  -Patient declined second opinion with [facility] or [facility] due to transportation issues/social support.  Considering there was a carcinoma component, we decided to treat like lung cancer.  -Started on dose reduced carboplatin, Taxol and Keytruda on 06/01/2023.  Poor functional status/ poor tolerance with two hospitalizations for sepsis/ and second for weakness.  -Now on maintenance Keytruda.  Labs reviewed  and acceptable for treatment.  Will proceed.  Follow-up in 3 weeks.  # Chronic respiratory failure on 2 L oxygen # Asthma and COPD overlap # Idiopathic pulmonary fibrosis -Management per pulmonary.  On Dupixent  # Hypocalcemia -Calcium 7.6.  Will add vitamin D level. -Advised the patient to start on calcium vitamin D supplements.  # Hyponatremia -Chronic in nature.  Sodium 129 at baseline.  # Normocytic anemia -Iron panel consistent with iron deficiency. Also chemotherapy component. -Proceed with IV Venofer 200 mg today.  Repeat iron levels next time.  # Cancer related pain -In mid chest.  Continue with oxycodone 5 mg Q8 as needed.   #CAD- on aspirin, plavix  # IV access-port removed on 06/14/23 due to sepsis.   -Inserted on left side on 07/30/23.  Orders Placed This Encounter  Procedures   CBC with Differential (Cancer Patterson Only)    Standing Status:   Future    Standing Expiration Date:   10/17/2024   CMP (Cancer Patterson only)    Standing Status:   Future    Standing Expiration Date:   10/17/2024   Iron and TIBC    Standing Status:   Future    Standing Expiration Date:   09/26/2024   Ferritin    Standing Status:   Future    Standing Expiration Date:   09/26/2024   RTC in 3 weeks for MD visit, labs, cycle 7 Keytruda  The total time spent in the appointment was 30 minutes encounter with patients including review of chart and various tests results, discussions about plan of care and coordination of care plan   All questions were answered. The patient knows to call the clinic with any problems, questions or concerns. No barriers to learning was detected.  Michaelyn Barter, MD 12/9/20244:12 PM   HISTORY OF PRESENTING ILLNESS:  Austin Patterson 67 y.o. 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  Patient was seen today in the clinic for further evaluation.  He is a remote smoker quit in 2016.  Prior smoked 2 packs a day since age 87.  He is on 2 L oxygen since 2017.  Lives alone.  Was in a wheelchair.  Does not have family close by.  Follows with Dr. Jayme Cloud.   Interval history Patient seen today as follow-up for MD visit, labs, Keytruda. He has been feeling well overall.  Reports right-sided abdominal pain for past couple of weeks, intermittent, ranging 3-5 on pain scale.  He had similar pain about 2 months ago when he was in the emergency room and had CT scan done which was largely unremarkable.  Denies any nausea, vomiting, changes in appetite.  Denies diarrhea.  Has mild constipation with iron pill which is manageable.  We discussed about monitoring abdominal pain for now.  If worsens, to inform the provider and will obtain CT imaging.  Right now he is due for his CT scan in mid January for lung cancer.   I have reviewed his chart and materials related to his cancer extensively and collaborated history with the patient. Summary of oncologic history is as follows:  Oncology History  Stage IV adenocarcinoma of lung (HCC)  05/18/2023 Initial Diagnosis   Sarcomatoid carcinoma of lung (HCC)   05/20/2023 Cancer Staging   Staging form: Lung, AJCC 8th Edition - Clinical: Stage IV (cT4, cN3, cM1) - Signed by Michaelyn Barter, MD on 05/20/2023 Stage prefix: Initial diagnosis   06/01/2023 -  Chemotherapy   Patient is on Treatment Plan : LUNG NSCLC Carboplatin (6) + Paclitaxel (200) + Pembrolizumab (200) D1 q21d x 4 cycles / Pembrolizumab (200) Maintenance D1 q21d       MEDICAL HISTORY:  Past Medical History:  Diagnosis Date   Abdominal pain 12/07/2022   Acute  urinary retention 12/07/2022   Chest pain 01/24/2022   COPD (chronic obstructive pulmonary disease) (HCC)    GERD (gastroesophageal reflux disease)    Hemothorax on right 05/11/2023   Hyperlipidemia    Hypertension    Pleuritic pain 05/10/2023   Pneumothorax after biopsy 05/11/2023   Post procedure discomfort 05/13/2023   Pulmonary fibrosis (HCC) 11/2015    SURGICAL HISTORY: Past Surgical History:  Procedure Laterality Date   COLONOSCOPY     CORONARY STENT PLACEMENT     ESOPHAGOGASTRODUODENOSCOPY (EGD) WITH PROPOFOL N/A 09/23/2016   Procedure: ESOPHAGOGASTRODUODENOSCOPY (EGD) WITH PROPOFOL;  Surgeon: Wyline Mood, MD;  Location: [facility] ENDOSCOPY;  Service: Endoscopy;  Laterality: N/A;   IR IMAGING GUIDED PORT INSERTION  05/26/2023   IR IMAGING GUIDED PORT INSERTION  07/30/2023   IR RADIOLOGIST EVAL & MGMT  07/20/2023   KYPHOPLASTY N/A 03/14/2020   Procedure: T7 & T11 KYPHOPLASTY;  Surgeon: Kennedy Bucker, MD;  Location: [facility] ORS;  Service: Orthopedics;  Laterality: N/A;   PORT-A-CATH REMOVAL N/A 06/14/2023   Procedure: REMOVAL PORT-A-CATH;  Surgeon: Sung Amabile, DO;  Location: [facility] ORS;  Service: General;  Laterality: N/A;   SHOULDER ACROMIOPLASTY  SOCIAL HISTORY: Social History   Socioeconomic History   Marital status: Legally Separated    Spouse name: Not on file   Number of children: Not on file   Years of education: Not on file   Highest education level: Not on file  Occupational History   Not on file  Tobacco Use   Smoking status: Former    Current packs/day: 0.00    Average packs/day: 2.0 packs/day for 50.0 years (100.0 ttl pk-yrs)    Types: Cigarettes    Start date: 10/13/1965    Quit date: 10/14/2015    Years since quitting: 7.9   Smokeless tobacco: Former  Building services engineer status: Never Used  Substance and Sexual Activity   Alcohol use: Yes    Alcohol/week: 56.0 standard drinks of alcohol    Types: 56 Cans of beer per week    Comment: "I sit around and  drink beer, that's all I got to do"   Drug use: No   Sexual activity: Not Currently  Other Topics Concern   Not on file  Social History Narrative   Not on file   Social Determinants of Health   Financial Resource Strain: Not on file  Food Insecurity: No Food Insecurity (06/12/2023)   Hunger Vital Sign    Worried About Running Out of Food in the Last Year: Never true    Ran Out of Food in the Last Year: Never true  Transportation Needs: Unmet Transportation Needs (08/04/2023)   PRAPARE - Administrator, Civil Service (Medical): Yes    Lack of Transportation (Non-Medical): No  Physical Activity: Not on file  Stress: Not on file  Social Connections: Not on file  Intimate Partner Violence: Not At Risk (06/12/2023)   Humiliation, Afraid, Rape, and Kick questionnaire    Fear of Current or Ex-Partner: No    Emotionally Abused: No    Physically Abused: No    Sexually Abused: No    FAMILY HISTORY: Family History  Problem Relation Age of Onset   Heart disease Mother     ALLERGIES:  is allergic to amoxicillin and tizanidine.  MEDICATIONS:  Current Outpatient Medications  Medication Sig Dispense Refill   acetaminophen (TYLENOL) 500 MG tablet Take 1,000 mg by mouth every 6 (six) hours as needed for moderate pain or headache.     albuterol (VENTOLIN HFA) 108 (90 Base) MCG/ACT inhaler Inhale 2 puffs into the lungs every 6 (six) hours as needed for wheezing or shortness of breath. 8 g 11   amLODipine (NORVASC) 5 MG tablet Take 1 tablet (5 mg total) by mouth at bedtime. Skip the dose if systolic BP less than 130 mmHg 30 tablet 5   aspirin EC 81 MG tablet Take 1 tablet (81 mg total) by mouth daily. 90 tablet 3   atorvastatin (LIPITOR) 40 MG tablet Take 1 tablet (40 mg total) by mouth at bedtime. 90 tablet 3   CALCIUM 600/VITAMIN D 600-10 MG-MCG TABS Take 1 tablet by mouth 2 (two) times daily.     clopidogrel (PLAVIX) 75 MG tablet Take 1 tablet (75 mg total) by mouth daily. 90  tablet 3   Dupilumab (DUPIXENT) 300 MG/2ML SOPN Inject 300 mg into the skin every 14 (fourteen) days. 12 mL 1   EPINEPHrine 0.3 mg/0.3 mL IJ SOAJ injection Inject 0.3 mg into the muscle as needed for anaphylaxis.     ferrous sulfate 325 (65 FE) MG tablet Take 1 tablet (325 mg total) by mouth 2 (  two) times daily with a meal. 60 tablet 2   fluticasone-salmeterol (ADVAIR DISKUS) 250-50 MCG/ACT AEPB Inhale 1 puff into the lungs in the morning and at bedtime. 60 each 11   gabapentin (NEURONTIN) 300 MG capsule TAKE 1 CAPSULE BY MOUTH THREE TIMES A DAY 90 capsule 1   guaiFENesin (ROBITUSSIN) 100 MG/5ML liquid Take 5 mLs by mouth every 4 (four) hours as needed for cough or to loosen phlegm. 236 mL 1   guaiFENesin-dextromethorphan (ROBITUSSIN DM) 100-10 MG/5ML syrup Take 5 mLs by mouth every 4 (four) hours as needed for cough. 118 mL 0   ipratropium-albuterol (DUONEB) 0.5-2.5 (3) MG/3ML SOLN INHALE 1 VIAL THROUGH NEBULIZER EVERY 6 HOURS 270 mL 2   lidocaine-prilocaine (EMLA) cream Apply to affected area once 30 g 3   magnesium oxide (MAG-OX) 400 (241.3 Mg) MG tablet Take 1 tablet (400 mg total) by mouth daily. 30 tablet 0   metoprolol succinate (TOPROL-XL) 100 MG 24 hr tablet Take 1 tablet (100 mg total) by mouth daily. 30 tablet 11   montelukast (SINGULAIR) 10 MG tablet Take 10 mg by mouth daily.     Multiple Vitamin (MULTIVITAMIN WITH MINERALS) TABS tablet Take 1 tablet by mouth daily. 30 tablet 0   ondansetron (ZOFRAN) 8 MG tablet Take 1 tablet (8 mg total) by mouth every 8 (eight) hours as needed for nausea or vomiting. Start on the third day after carboplatin. 30 tablet 1   oxyCODONE (OXY IR/ROXICODONE) 5 MG immediate release tablet Take 1 tablet (5 mg total) by mouth every 8 (eight) hours as needed for severe pain (pain score 7-10). 90 tablet 0   pantoprazole (PROTONIX) 40 MG tablet Take 1 tablet (40 mg total) by mouth 2 (two) times daily. 90 tablet 3   prochlorperazine (COMPAZINE) 10 MG tablet Take 1  tablet (10 mg total) by mouth every 6 (six) hours as needed for nausea or vomiting. 30 tablet 1   senna-docusate (SENOKOT-S) 8.6-50 MG tablet Take 1 tablet by mouth 2 (two) times daily. 30 tablet 1   Spacer/Aero-Holding Chambers (AEROCHAMBER MV) inhaler Use as instructed 1 each 0   No current facility-administered medications for this visit.    REVIEW OF SYSTEMS:   Pertinent information mentioned in HPI All other systems were reviewed with the patient and are negative.  PHYSICAL EXAMINATION: ECOG PERFORMANCE STATUS: 3   Vitals:   09/27/23 1329  BP: 130/72  Pulse: 92  Resp: 20  Temp: 98.2 F (36.8 C)  SpO2: 97%    Filed Weights   09/27/23 1329  Weight: 192 lb (87.1 kg)     GENERAL:alert, no distress and comfortable SKIN: skin color, texture, turgor are normal, no rashes or significant lesions EYES: normal, conjunctiva are pink and non-injected, sclera clear OROPHARYNX:no exudate, no erythema and lips, buccal mucosa, and tongue normal  NECK: supple, thyroid normal size, non-tender, without nodularity LYMPH:  no palpable lymphadenopathy in the cervical, axillary or inguinal LUNGS: clear to auscultation and percussion with normal breathing effort HEART: regular rate & rhythm and no murmurs and no lower extremity edema ABDOMEN:abdomen soft, non-tender and normal bowel sounds Musculoskeletal:no cyanosis of digits and no clubbing  PSYCH: alert & oriented x 3 with fluent speech NEURO: no focal motor/sensory deficits  LABORATORY DATA:  I have reviewed the data as listed Lab Results  Component Value Date   WBC 5.0 09/27/2023   HGB 11.4 (L) 09/27/2023   HCT 35.3 (L) 09/27/2023   MCV 100.6 (H) 09/27/2023   PLT 242 09/27/2023  Recent Labs    06/16/23 0824 06/17/23 0329 08/16/23 0935 09/06/23 0907 09/27/23 1258  NA 133*   < > 134* 135 135  K 3.5   < > 3.6 3.5 3.5  CL 98   < > 96* 98 100  CO2 28   < > 29 28 25   GLUCOSE 152*   < > 111* 125* 138*  BUN 10   < > 10 15  15   CREATININE 0.55*   < > 0.61 0.68 0.73  CALCIUM 7.0*   < > 8.4* 7.6* 8.8*  GFRNONAA >60   < > >60 >60 >60  PROT 6.5   < > 7.1 6.5 6.9  ALBUMIN 2.9*   < > 3.6 4.0 4.2  AST 18   < > 18 25 19   ALT 25   < > 18 37 19  ALKPHOS 87   < > 80 53 48  BILITOT 0.3   < > 0.3 0.7 0.6  BILIDIR <0.1  --   --   --   --   IBILI NOT CALCULATED  --   --   --   --    < > = values in this interval not displayed.    RADIOGRAPHIC STUDIES: I have personally reviewed the radiological images as listed and agreed with the findings in the report. CT CHEST W CONTRAST  Result Date: 09/06/2023 CLINICAL DATA:  Lung cancer EXAM: CT CHEST WITH CONTRAST TECHNIQUE: Multidetector CT imaging of the chest was performed during intravenous contrast administration. RADIATION DOSE REDUCTION: This exam was performed according to the departmental dose-optimization program which includes automated exposure control, adjustment of the mA and/or kV according to patient size and/or use of iterative reconstruction technique. CONTRAST:  75mL OMNIPAQUE IOHEXOL 300 MG/ML  SOLN COMPARISON:  06/19/2023 FINDINGS: Cardiovascular: The heart is normal in size. No pericardial effusion. No evidence of thoracic aortic aneurysm. Atherosclerotic calcifications of the aortic arch. Severe three-vessel coronary atherosclerosis. Left chest port terminates in the lower SVC. Mediastinum/Nodes: Small mediastinal and right perihilar nodes, including a dominant 11 mm short axis right paratracheal node (series 2/image 83), previously 13 mm. Visualized thyroid is unremarkable. Lungs/Pleura: Moderate centrilobular and paraseptal emphysematous changes with large bulla in the lateral right hemithorax. 4.8 x 3.0 cm inferior right middle lobe mass (series 4/image 96), previously 7.1 x 5.9 cm. 18 x 13 mm irregular nodular opacity in the right lower lobe (series 4/image 90), previously 12 x 12 mm. 19 x 11 mm irregular nodule at the right lung base (series 4/image 117),  previously 22 x 12 mm when measured in a similar fashion, improved. 8 mm nodule at the left lung base (series 4/image 118), previously 14 mm, improved. No focal consolidation. Scarring with bronchiectasis in the medial left lung apex (series 4/image 22), new. No pleural effusion or pneumothorax. Upper Abdomen: Visualized upper abdomen is notable for stable low-density bilateral renal nodules, previously characterized as benign adrenal adenomas. Vascular calcifications. Musculoskeletal: Prior vertebral augmentation at T7 and T11. Mild degenerative changes of the visualized thoracolumbar spine. Chronic right scapular deformity. IMPRESSION: Improving right middle lobe mass, corresponding to the patient's known primary bronchogenic carcinoma. Small mediastinal and right perihilar nodal metastases are also improving. Associated bilateral lower lobe pulmonary metastases, overall improved, although one right lower lobe nodular opacity is mildly progressive. Attention on follow-up is suggested. Stable bilateral adrenal adenomas. Aortic Atherosclerosis (ICD10-I70.0) and Emphysema (ICD10-J43.9). Electronically Signed   By: Charline Bills M.D.   On: 09/06/2023 09:26

## 2023-09-27 NOTE — Patient Instructions (Signed)
 [facility] - A DEPT OF [facility]  Discharge Instructions: Thank you for choosing [facility] to provide your oncology and hematology care.  If you have a lab appointment with the Cancer Center, please go directly to the Cancer Center and check in at the registration area.  Wear comfortable clothing and clothing appropriate for easy access to any Portacath or PICC line.   We strive to give you quality time with your provider. You may need to reschedule your appointment if you arrive late (15 or more minutes).  Arriving late affects you and other patients whose appointments are after yours.  Also, if you miss three or more appointments without notifying the office, you may be dismissed from the clinic at the provider's discretion.      For prescription refill requests, have your pharmacy contact our office and allow 72 hours for refills to be completed.     To help prevent nausea and vomiting after your treatment, we encourage you to take your nausea medication as directed.  BELOW ARE SYMPTOMS THAT SHOULD BE REPORTED IMMEDIATELY: *FEVER GREATER THAN 100.4 F (38 C) OR HIGHER *CHILLS OR SWEATING *NAUSEA AND VOMITING THAT IS NOT CONTROLLED WITH YOUR NAUSEA MEDICATION *UNUSUAL SHORTNESS OF BREATH *UNUSUAL BRUISING OR BLEEDING *URINARY PROBLEMS (pain or burning when urinating, or frequent urination) *BOWEL PROBLEMS (unusual diarrhea, constipation, pain near the anus) TENDERNESS IN MOUTH AND THROAT WITH OR WITHOUT PRESENCE OF ULCERS (sore throat, sores in mouth, or a toothache) UNUSUAL RASH, SWELLING OR PAIN  UNUSUAL VAGINAL DISCHARGE OR ITCHING   Items with * indicate a potential emergency and should be followed up as soon as possible or go to the Emergency Department if any problems should occur.  Please show the CHEMOTHERAPY ALERT CARD or IMMUNOTHERAPY ALERT CARD at check-in to the Emergency Department and triage nurse.  Should you have  questions after your visit or need to cancel or reschedule your appointment, please contact [facility] - A DEPT OF [facility]  [phone] and follow the prompts.  Office hours are 8:00 a.m. to 4:30 p.m. Monday - Friday. Please note that voicemails left after 4:00 p.m. may not be returned until the following business day.  We are closed weekends and major holidays. You have access to a nurse at all times for urgent questions. Please call the main number to the clinic [phone] and follow the prompts.  For any non-urgent questions, you may also contact your provider using MyChart. We now offer e-Visits for anyone 74 and older to request care online for non-urgent symptoms. For details visit [email or web address].   Also download the MyChart app! Go to the app store, search "MyChart", open the app, select [place], and log in with your MyChart username and password.

## 2023-09-27 NOTE — Progress Notes (Signed)
Dull pain starts in right lower side and radiates to the front of his stomach, comes and goes not constant, ongoing for a couple of weeks now. He needs a refill on his lidocaine (EMLA) cream.  Patient says that since he started treatment it feels like his lungs are on fire/bother him more than before. He told me that he got his flu vaccine yesterday.

## 2023-09-28 LAB — T4: T4, Total: 5.5 ug/dL (ref 4.5–12.0)

## 2023-10-06 ENCOUNTER — Ambulatory Visit: Payer: Medicare HMO | Admitting: Pulmonary Disease

## 2023-10-06 ENCOUNTER — Encounter: Payer: Self-pay | Admitting: Pulmonary Disease

## 2023-10-06 VITALS — BP 130/80 | HR 83 | Temp 97.8°F | Ht 67.0 in | Wt 192.0 lb

## 2023-10-06 DIAGNOSIS — J449 Chronic obstructive pulmonary disease, unspecified: Secondary | ICD-10-CM

## 2023-10-06 DIAGNOSIS — C3491 Malignant neoplasm of unspecified part of right bronchus or lung: Secondary | ICD-10-CM

## 2023-10-06 DIAGNOSIS — J4550 Severe persistent asthma, uncomplicated: Secondary | ICD-10-CM | POA: Diagnosis not present

## 2023-10-06 DIAGNOSIS — J9611 Chronic respiratory failure with hypoxia: Secondary | ICD-10-CM | POA: Diagnosis not present

## 2023-10-06 DIAGNOSIS — E8801 Alpha-1-antitrypsin deficiency: Secondary | ICD-10-CM | POA: Diagnosis not present

## 2023-10-06 NOTE — Progress Notes (Signed)
Subjective:    Patient ID: Austin Patterson, male    DOB: Aug 26, 1956, 67 y.o.   MRN: [id number]  Patient Care Team: Pcp, No as PCP - General Debbe Odea, MD as PCP - Cardiology (Cardiology) Salena Saner, MD as Consulting Physician (Pulmonary Disease) Glory Buff, RN as Oncology Nurse Navigator Michaelyn Barter, MD as Consulting Physician (Oncology)  Chief Complaint  Patient presents with   Follow-up    DOE. No wheezing. Occasional cough with thick yellow sputum.    BACKGROUND/INTERVAL:Austin Patterson is a 67 year old former smoker (100 PY) with stage IV very severe COPD, severe persistent asthma and chronic respiratory failure with hypoxia who presents for follow-up of these issues.  I last saw him on 24 June 2023 and at that time he was well compensated from his respiratory issues.  At baseline the patient is very sedentary.  This is due to debilitating dyspnea.  He is compliant with oxygen at 2 L/min 24/7.  He has a complex respiratory history with very severe asthma with IgE initially recorded at 1950 this was noted to be directed to Aspergillus fumigatus (ABPA).  He is also a heterozygous alpha-1 MZ phenotype with normal alpha-1 levels.  He has been on Dupixent for management of his asthma component.  Since his prior visit he has been maintained on Keytruda after initial therapy of reduced carboplatin, Taxol and Keytruda.  He is not exhibiting symptoms of pneumonitis on the Keytruda.  HPI Discussed the use of AI scribe software for clinical note transcription with the patient, who gave verbal consent to proceed.  History of Present Illness   The patient, with a complex medical history including lung cancer, COPD, asthma, and ABPA, presents for a follow-up visit. He has recently transitioned from chemotherapy to [facility], with this being his second treatment. The patient reports feeling "not bad" and is awaiting a CT scan after the third Keytruda treatment to assess tumor  response.  The patient's lung cancer, a type of sarcoma, has shown significant reduction in size on previous imaging. He also has COPD with fibrosis and a bulla, which complicates the interpretation of imaging.  In terms of respiratory symptoms, the patient is managing with Advair three times a day and Albuterol as needed. He reports that Albuterol "really gets things opened up." He also continues on Dupixent for his ABPA.  The patient is on supplemental oxygen at 2 liters, which he adjusts as needed, particularly when away from home. He reports no current wheezing, a symptom he used to experience frequently, particularly in the mornings.  The patient also has a history of alpha-1 antitrypsin deficiency, with the MZ variant. His levels have been trending upwards, with the most recent measurement in June showing a level of 116.  Overall, the patient's condition appears stable with the new treatment regimen, and he is awaiting further imaging to assess the response of his lung cancer to [facility].      Review of Systems A 10 point review of systems was performed and it is as noted above otherwise negative.   Patient Active Problem List   Diagnosis Date Noted   Hypocalcemia 09/06/2023   Overweight (BMI 25.0-29.9) 08/08/2023   SIRS (systemic inflammatory response syndrome) (HCC) 08/06/2023   Encounter for monoclonal antibody treatment for malignancy 07/05/2023   Cancer related pain 07/05/2023   Paroxysmal atrial fibrillation (HCC) 06/16/2023   Sepsis (HCC) 06/12/2023   Iron deficiency anemia 06/08/2023   Stage IV adenocarcinoma of lung (HCC) 05/18/2023   Swelling of right  foot 05/11/2023   Heterozygous alpha 1-antitrypsin deficiency (HCC) 04/27/2023   Cough 04/03/2023   Alcohol use 04/03/2023   Constipation 12/10/2022   COPD exacerbation (HCC) 02/23/2022   GERD without esophagitis 02/23/2022   Alpha-1-antitrypsin deficiency carrier 01/10/2021   History of nonmelanoma skin cancer  12/19/2020   Asthma 03/12/2020   Chronic respiratory failure with hypoxia (HCC) 03/12/2020   Hyperlipidemia    Asthma-COPD overlap syndrome (HCC)    Bleeding nose    Alcohol abuse    RUQ abdominal pain    Anaphylaxis 11/01/2019   Hypotension 11/01/2019   CAD (coronary artery disease) 11/01/2019   H/O heart artery stent 11/01/2019   Acute on chronic respiratory failure with hypoxia (HCC)    RUQ pain    Essential hypertension    Pulmonary fibrosis (HCC) 08/30/2019   Hypomagnesemia 03/31/2017   Hiatal hernia    Reflux esophagitis    Hematemesis without nausea    Hyponatremia 09/21/2016   Multifocal pneumonia 12/09/2015    Social History   Tobacco Use   Smoking status: Former    Current packs/day: 0.00    Average packs/day: 2.0 packs/day for 50.0 years (100.0 ttl pk-yrs)    Types: Cigarettes    Start date: 10/13/1965    Quit date: 10/14/2015    Years since quitting: 7.9   Smokeless tobacco: Former  Substance Use Topics   Alcohol use: Yes    Alcohol/week: 56.0 standard drinks of alcohol    Types: 56 Cans of beer per week    Comment: "I sit around and drink beer, that's all I got to do"    Allergies  Allergen Reactions   Amoxicillin Anaphylaxis   Tizanidine     Feet and ankle swell     Current Meds  Medication Sig   acetaminophen (TYLENOL) 500 MG tablet Take 1,000 mg by mouth every 6 (six) hours as needed for moderate pain or headache.   albuterol (VENTOLIN HFA) 108 (90 Base) MCG/ACT inhaler Inhale 2 puffs into the lungs every 6 (six) hours as needed for wheezing or shortness of breath.   amLODipine (NORVASC) 5 MG tablet Take 1 tablet (5 mg total) by mouth at bedtime. Skip the dose if systolic BP less than 130 mmHg   aspirin EC 81 MG tablet Take 1 tablet (81 mg total) by mouth daily.   atorvastatin (LIPITOR) 40 MG tablet Take 1 tablet (40 mg total) by mouth at bedtime.   CALCIUM 600/VITAMIN D 600-10 MG-MCG TABS Take 1 tablet by mouth 2 (two) times daily.    clopidogrel (PLAVIX) 75 MG tablet Take 1 tablet (75 mg total) by mouth daily.   Dupilumab (DUPIXENT) 300 MG/2ML SOPN Inject 300 mg into the skin every 14 (fourteen) days.   EPINEPHrine 0.3 mg/0.3 mL IJ SOAJ injection Inject 0.3 mg into the muscle as needed for anaphylaxis.   fluticasone-salmeterol (ADVAIR DISKUS) 250-50 MCG/ACT AEPB Inhale 1 puff into the lungs in the morning and at bedtime.   gabapentin (NEURONTIN) 300 MG capsule TAKE 1 CAPSULE BY MOUTH THREE TIMES A DAY   guaiFENesin (ROBITUSSIN) 100 MG/5ML liquid Take 5 mLs by mouth every 4 (four) hours as needed for cough or to loosen phlegm.   guaiFENesin-dextromethorphan (ROBITUSSIN DM) 100-10 MG/5ML syrup Take 5 mLs by mouth every 4 (four) hours as needed for cough.   ipratropium-albuterol (DUONEB) 0.5-2.5 (3) MG/3ML SOLN INHALE 1 VIAL THROUGH NEBULIZER EVERY 6 HOURS   lidocaine-prilocaine (EMLA) cream Apply to affected area once   magnesium oxide (MAG-OX) 400 (241.3  Mg) MG tablet Take 1 tablet (400 mg total) by mouth daily.   metoprolol succinate (TOPROL-XL) 100 MG 24 hr tablet Take 1 tablet (100 mg total) by mouth daily.   montelukast (SINGULAIR) 10 MG tablet Take 10 mg by mouth daily.   Multiple Vitamin (MULTIVITAMIN WITH MINERALS) TABS tablet Take 1 tablet by mouth daily.   ondansetron (ZOFRAN) 8 MG tablet Take 1 tablet (8 mg total) by mouth every 8 (eight) hours as needed for nausea or vomiting. Start on the third day after carboplatin.   oxyCODONE (OXY IR/ROXICODONE) 5 MG immediate release tablet Take 1 tablet (5 mg total) by mouth every 8 (eight) hours as needed for severe pain (pain score 7-10).   pantoprazole (PROTONIX) 40 MG tablet Take 1 tablet (40 mg total) by mouth 2 (two) times daily.   prochlorperazine (COMPAZINE) 10 MG tablet Take 1 tablet (10 mg total) by mouth every 6 (six) hours as needed for nausea or vomiting.   senna-docusate (SENOKOT-S) 8.6-50 MG tablet Take 1 tablet by mouth 2 (two) times daily.   Spacer/Aero-Holding  Chambers (AEROCHAMBER MV) inhaler Use as instructed    Immunization History  Administered Date(s) Administered   Fluad Quad(high Dose 65+) 10/03/2023   Influenza Inj Mdck Quad Pf 08/10/2019   Influenza Inj Mdck Quad With Preservative 07/18/2018   Influenza,inj,Quad PF,6+ Mos 10/15/2015, 08/31/2016   Influenza-Unspecified 08/10/2019, 07/19/2020, 07/19/2022   Moderna SARS-COV2 Booster Vaccination 10/23/2020   Moderna Sars-Covid-2 Vaccination 11/21/2019, 12/21/2019   Pneumococcal Polysaccharide-23 10/15/2015        Objective:     BP 130/80 (BP Location: Right Arm, Cuff Size: Normal)   Pulse 83   Temp 97.8 F (36.6 C)   Ht 5\' 7"  (1.702 m)   Wt 192 lb (87.1 kg) Comment: per the patient.in a wheelchair today.  SpO2 97%   BMI 30.07 kg/m   SpO2: 97 % O2 Device: None (Room air)  GENERAL: Chronically ill appearing gentleman, chronic use of accessories, presents in transport chair.  Wearing oxygen via nasal cannula.  No acute respiratory distress.  No plethora.  HEAD: Normocephalic, atraumatic. EYES: Pupils equal, round, reactive to light.  No scleral icterus. MOUTH: Teeth in poor repair, oral mucosa moist, no thrush. NECK: Supple. No thyromegaly. Trachea midline. No JVD.  No adenopathy. PULMONARY: Increased AP diameter, significant kyphosis. Distant breath sounds.  No adventitious sounds.  Positive Hoover's sign. CARDIOVASCULAR: S1 and S2. Regular rate and rhythm.  Distant heart tones no murmur appreciated. GASTROINTESTINAL: Protuberant abdomen otherwise benign. MUSCULOSKELETAL: No joint deformity, no clubbing, no edema. NEUROLOGIC: No overt focal deficit.  Speech is fluent.  Awake, alert. SKIN: Intact,warm,dry. PSYCH: Mood and behavior normal.  Independently reviewed most recent CT chest of 30 August 2023: This shows continued improvement on the right middle lobe mass and on the small mediastinal and right perihilar nodal metastasis.  Associated bilateral lower lobe metastasis  have improved as well.  Assessment & Plan:     ICD-10-CM   1. Stage 4 very severe COPD by GOLD classification (HCC)  J44.9     2. Chronic respiratory failure with hypoxia (HCC)  J96.11     3. Severe persistent asthma without complication  J45.50     4. Heterozygous alpha 1-antitrypsin deficiency (HCC)  E88.01     5. 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 He has been switched from chemotherapy to [facility], with the second treatment recently completed. Previous CT scans have shown significant reduction in tumor size. Monitoring for potential side effects of Keytruda, such as pneumonitis, is crucial. - Continue Keytruda treatment   - Order CT scan with contrast after three Keytruda treatments per Dr. Michae Kava - Monitor for signs of pneumonitis   - Follow up in two months    Chronic Obstructive Pulmonary Disease (COPD)   Patient has COPD with associated fibrosis. He is currently using Advair and DuoNeb three times a day, which helps open up his airways. He is also on supplemental oxygen at 2 liters per minute.   - Continue Advair three times a day   - Continue DuoNeb three times a day   - Continue supplemental oxygen at 2 liters per minute    Asthma with Allergic Bronchopulmonary Aspergillosis (ABPA)   Patient's asthma and ABPA are well controlled with Dupixent. Studies suggest Dupixent may help enhance the effects of chemotherapy.   - Continue Dupixent    Alpha-1 Antitrypsin Deficiency   Patient has MZ alpha-1 antitrypsin deficiency. The last test in June showed a level of 116, which is within a good range. The levels have been trending up since a low of 90 in 2022.   - Check alpha-1 antitrypsin levels at next visit in May or June    General Health  Maintenance   Patient's overall health is being monitored closely due to the complexity of his conditions.   - Monitor oxygen levels   - Ensure patient avoids falls due to oxygen line    Follow-up   - Follow up in two months.     Advised if symptoms do not improve or worsen, to please contact office for sooner follow up or seek emergency care.    I spent 42 minutes of dedicated to the care of this patient on the date of this encounter to include pre-visit review of records, face-to-face time with the patient discussing conditions above, post visit ordering of testing, clinical documentation with the electronic health record, making appropriate referrals as documented, and communicating necessary findings to members of the patients care team.     C. Danice Goltz, MD Advanced Bronchoscopy PCCM [facility]    *This note was generated using voice recognition software/Dragon and/or AI transcription program.  Despite best efforts to proofread, errors can occur which can change the meaning. Any transcriptional errors that result from this process are unintentional and may not be fully corrected at the time of dictation.

## 2023-10-06 NOTE — Patient Instructions (Signed)
VISIT SUMMARY:  You had a follow-up visit today to review your ongoing treatments and overall health. You are currently on Keytruda for your lung cancer, and you are managing your COPD, asthma, and ABPA with your current medications. Your condition appears stable, and we are awaiting further imaging to assess the response of your lung cancer to [facility].  YOUR PLAN:  -LUNG CANCER: Lung cancer is a disease where abnormal cells in the lung grow uncontrollably. You have a rare type called sarcoma. You have recently switched from chemotherapy to [facility], and you have completed your second treatment. We will continue with Keytruda and order a CT scan with contrast after your third treatment to check for tumor shrinkage. Please monitor for any signs of pneumonitis, such as new or worsening cough, shortness of breath, or chest pain. If these occur, contact us immediately. We will follow up in two months.  -CHRONIC OBSTRUCTIVE PULMONARY DISEASE (COPD): COPD is a chronic lung disease that makes it hard to breathe. You have COPD with fibrosis and are using Advair and albuterol three times a day, which helps open up your airways. You are also on supplemental oxygen at 2 liters per minute. Continue with your current medications and oxygen therapy.  -ASTHMA WITH ALLERGIC BRONCHOPULMONARY ASPERGILLOSIS (ABPA): Asthma is a condition where your airways narrow and swell, making it difficult to breathe. ABPA is a complication of asthma caused by an allergic reaction to a fungus. Your asthma and ABPA are well controlled with Dupixent. Continue using Dupixent as prescribed.  -ALPHA-1 ANTITRYPSIN DEFICIENCY: Alpha-1 antitrypsin deficiency is a genetic condition that can affect your lungs and liver. You have the MZ variant, and your levels have been stable and trending upwards. We will check your alpha-1 antitrypsin levels again in May or June.  -GENERAL HEALTH MAINTENANCE: We are closely monitoring your overall health due  to the complexity of your conditions. Please continue to monitor your oxygen levels and be cautious to avoid falls due to your oxygen line.  INSTRUCTIONS:  Please follow up in two months. We will also schedule a CT scan with contrast after your third Keytruda treatment to assess the response of your lung cancer. If you experience any new or worsening symptoms, especially related to your breathing, contact us immediately.

## 2023-10-08 ENCOUNTER — Encounter: Payer: Self-pay | Admitting: Pulmonary Disease

## 2023-10-15 ENCOUNTER — Encounter: Payer: Self-pay | Admitting: Internal Medicine

## 2023-10-18 ENCOUNTER — Inpatient Hospital Stay: Payer: Medicare HMO

## 2023-10-18 ENCOUNTER — Encounter: Payer: Self-pay | Admitting: Internal Medicine

## 2023-10-18 ENCOUNTER — Inpatient Hospital Stay: Payer: Medicare HMO | Admitting: Oncology

## 2023-10-18 ENCOUNTER — Inpatient Hospital Stay (HOSPITAL_BASED_OUTPATIENT_CLINIC_OR_DEPARTMENT_OTHER): Payer: Medicare HMO | Admitting: Internal Medicine

## 2023-10-18 ENCOUNTER — Other Ambulatory Visit: Payer: Medicare HMO

## 2023-10-18 ENCOUNTER — Ambulatory Visit: Payer: Medicare HMO | Admitting: Oncology

## 2023-10-18 ENCOUNTER — Ambulatory Visit: Payer: Medicare HMO

## 2023-10-18 VITALS — BP 149/76 | HR 89 | Temp 97.2°F | Ht 67.0 in | Wt 197.0 lb

## 2023-10-18 DIAGNOSIS — G893 Neoplasm related pain (acute) (chronic): Secondary | ICD-10-CM | POA: Diagnosis not present

## 2023-10-18 DIAGNOSIS — Z5112 Encounter for antineoplastic immunotherapy: Secondary | ICD-10-CM

## 2023-10-18 DIAGNOSIS — C3491 Malignant neoplasm of unspecified part of right bronchus or lung: Secondary | ICD-10-CM

## 2023-10-18 DIAGNOSIS — D509 Iron deficiency anemia, unspecified: Secondary | ICD-10-CM | POA: Diagnosis not present

## 2023-10-18 LAB — CMP (CANCER CENTER ONLY)
ALT: 17 U/L (ref 0–44)
AST: 20 U/L (ref 15–41)
Albumin: 4.4 g/dL (ref 3.5–5.0)
Alkaline Phosphatase: 43 U/L (ref 38–126)
Anion gap: 10 (ref 5–15)
BUN: 12 mg/dL (ref 8–23)
CO2: 27 mmol/L (ref 22–32)
Calcium: 9.1 mg/dL (ref 8.9–10.3)
Chloride: 97 mmol/L — ABNORMAL LOW (ref 98–111)
Creatinine: 0.67 mg/dL (ref 0.61–1.24)
GFR, Estimated: >60 mL/min (ref >60)
Glucose, Bld: 114 mg/dL — ABNORMAL HIGH (ref 70–99)
Potassium: 4 mmol/L (ref 3.5–5.1)
Sodium: 134 mmol/L — ABNORMAL LOW (ref 135–145)
Total Bilirubin: 0.5 mg/dL (ref <1.2)
Total Protein: 7.5 g/dL (ref 6.5–8.1)

## 2023-10-18 LAB — CBC WITH DIFFERENTIAL (CANCER CENTER ONLY)
Abs Immature Granulocytes: 0.04 10*3/uL (ref 0.00–0.07)
Basophils Absolute: 0 10*3/uL (ref 0.0–0.1)
Basophils Relative: 0 %
Eosinophils Absolute: 0.1 10*3/uL (ref 0.0–0.5)
Eosinophils Relative: 2 %
HCT: 36.2 % — ABNORMAL LOW (ref 39.0–52.0)
Hemoglobin: 12 g/dL — ABNORMAL LOW (ref 13.0–17.0)
Immature Granulocytes: 1 %
Lymphocytes Relative: 15 %
Lymphs Abs: 1.1 10*3/uL (ref 0.7–4.0)
MCH: 33.3 pg (ref 26.0–34.0)
MCHC: 33.1 g/dL (ref 30.0–36.0)
MCV: 100.6 fL — ABNORMAL HIGH (ref 80.0–100.0)
Monocytes Absolute: 0.6 10*3/uL (ref 0.1–1.0)
Monocytes Relative: 8 %
Neutro Abs: 5.1 10*3/uL (ref 1.7–7.7)
Neutrophils Relative %: 74 %
Platelet Count: 247 10*3/uL (ref 150–400)
RBC: 3.6 MIL/uL — ABNORMAL LOW (ref 4.22–5.81)
RDW: 16.6 % — ABNORMAL HIGH (ref 11.5–15.5)
WBC Count: 6.9 10*3/uL (ref 4.0–10.5)
nRBC: 0 % (ref 0.0–0.2)

## 2023-10-18 LAB — IRON AND TIBC
Iron: 64 ug/dL (ref 45–182)
Saturation Ratios: 16 % — ABNORMAL LOW (ref 17.9–39.5)
TIBC: 409 ug/dL (ref 250–450)
UIBC: 345 ug/dL

## 2023-10-18 LAB — FERRITIN: Ferritin: 122 ng/mL (ref 24–336)

## 2023-10-18 MED ORDER — OXYCODONE HCL 5 MG PO TABS: 5.0000 mg | ORAL_TABLET | Freq: Three times a day (TID) | ORAL | 0 refills | Status: DC | PRN

## 2023-10-18 MED ORDER — SODIUM CHLORIDE 0.9 % IV SOLN
Freq: Once | INTRAVENOUS | Status: AC
  Filled 2023-10-18: qty 250

## 2023-10-18 MED ORDER — HEPARIN SOD (PORK) LOCK FLUSH 100 UNIT/ML IV SOLN
500.0000 [IU] | Freq: Once | INTRAVENOUS | Status: AC | PRN
  Administered 2023-10-18: 500 [IU]
  Filled 2023-10-18: qty 5

## 2023-10-18 MED ORDER — SODIUM CHLORIDE 0.9 % IV SOLN
200.0000 mg | Freq: Once | INTRAVENOUS | Status: AC
  Administered 2023-10-18: 200 mg via INTRAVENOUS
  Filled 2023-10-18: qty 200

## 2023-10-18 NOTE — Patient Instructions (Signed)
 [facility] - A DEPT OF [facility]  Discharge Instructions: Thank you for choosing [facility] to provide your oncology and hematology care.  If you have a lab appointment with the Cancer Center, please go directly to the Cancer Center and check in at the registration area.  Wear comfortable clothing and clothing appropriate for easy access to any Portacath or PICC line.   We strive to give you quality time with your provider. You may need to reschedule your appointment if you arrive late (15 or more minutes).  Arriving late affects you and other patients whose appointments are after yours.  Also, if you miss three or more appointments without notifying the office, you may be dismissed from the clinic at the provider's discretion.      For prescription refill requests, have your pharmacy contact our office and allow 72 hours for refills to be completed.    Today you received the following chemotherapy and/or immunotherapy agents KEYTRUDA      To help prevent nausea and vomiting after your treatment, we encourage you to take your nausea medication as directed.  BELOW ARE SYMPTOMS THAT SHOULD BE REPORTED IMMEDIATELY: *FEVER GREATER THAN 100.4 F (38 C) OR HIGHER *CHILLS OR SWEATING *NAUSEA AND VOMITING THAT IS NOT CONTROLLED WITH YOUR NAUSEA MEDICATION *UNUSUAL SHORTNESS OF BREATH *UNUSUAL BRUISING OR BLEEDING *URINARY PROBLEMS (pain or burning when urinating, or frequent urination) *BOWEL PROBLEMS (unusual diarrhea, constipation, pain near the anus) TENDERNESS IN MOUTH AND THROAT WITH OR WITHOUT PRESENCE OF ULCERS (sore throat, sores in mouth, or a toothache) UNUSUAL RASH, SWELLING OR PAIN  UNUSUAL VAGINAL DISCHARGE OR ITCHING   Items with * indicate a potential emergency and should be followed up as soon as possible or go to the Emergency Department if any problems should occur.  Please show the CHEMOTHERAPY ALERT CARD or IMMUNOTHERAPY  ALERT CARD at check-in to the Emergency Department and triage nurse.  Should you have questions after your visit or need to cancel or reschedule your appointment, please contact [facility] - A DEPT OF [facility]  [phone] and follow the prompts.  Office hours are 8:00 a.m. to 4:30 p.m. Monday - Friday. Please note that voicemails left after 4:00 p.m. may not be returned until the following business day.  We are closed weekends and major holidays. You have access to a nurse at all times for urgent questions. Please call the main number to the clinic [phone] and follow the prompts.  For any non-urgent questions, you may also contact your provider using MyChart. We now offer e-Visits for anyone 71 and older to request care online for non-urgent symptoms. For details visit [email or web address].   Also download the MyChart app! Go to the app store, search "MyChart", open the app, select [place], and log in with your MyChart username and password.   Pembrolizumab Injection What is this medication? PEMBROLIZUMAB (PEM broe LIZ ue mab) treats some types of cancer. It works by helping your immune system slow or stop the spread of cancer cells. It is a monoclonal antibody. This medicine may be used for other purposes; ask your health care provider or pharmacist if you have questions. COMMON BRAND NAME(S): Keytruda What should I tell my care team before I take this medication? They need to know if you have any of these conditions: Allogeneic stem cell transplant (uses someone else's stem cells) Autoimmune diseases, such as Crohn  disease, ulcerative colitis, lupus History of chest radiation Nervous system problems, such as Guillain-Barre syndrome, myasthenia gravis Organ transplant An unusual or allergic reaction to pembrolizumab, other medications, foods, dyes, or preservatives Pregnant or trying to get pregnant Breast-feeding How should I use this  medication? This medication is injected into a vein. It is given by your care team in a hospital or clinic setting. A special MedGuide will be given to you before each treatment. Be sure to read this information carefully each time. Talk to your care team about the use of this medication in children. While it may be prescribed for children as young as 6 months for selected conditions, precautions do apply. Overdosage: If you think you have taken too much of this medicine contact a poison control center or emergency room at once. NOTE: This medicine is only for you. Do not share this medicine with others. What if I miss a dose? Keep appointments for follow-up doses. It is important not to miss your dose. Call your care team if you are unable to keep an appointment. What may interact with this medication? Interactions have not been studied. This list may not describe all possible interactions. Give your health care provider a list of all the medicines, herbs, non-prescription drugs, or dietary supplements you use. Also tell them if you smoke, drink alcohol, or use illegal drugs. Some items may interact with your medicine. What should I watch for while using this medication? Your condition will be monitored carefully while you are receiving this medication. You may need blood work while taking this medication. This medication may cause serious skin reactions. They can happen weeks to months after starting the medication. Contact your care team right away if you notice fevers or flu-like symptoms with a rash. The rash may be red or purple and then turn into blisters or peeling of the skin. You may also notice a red rash with swelling of the face, lips, or lymph nodes in your neck or under your arms. Tell your care team right away if you have any change in your eyesight. Talk to your care team if you may be pregnant. Serious birth defects can occur if you take this medication during pregnancy and for 4  months after the last dose. You will need a negative pregnancy test before starting this medication. Contraception is recommended while taking this medication and for 4 months after the last dose. Your care team can help you find the option that works for you. Do not breastfeed while taking this medication and for 4 months after the last dose. What side effects may I notice from receiving this medication? Side effects that you should report to your care team as soon as possible: Allergic reactions--skin rash, itching, hives, swelling of the face, lips, tongue, or throat Dry cough, shortness of breath or trouble breathing Eye pain, redness, irritation, or discharge with blurry or decreased vision Heart muscle inflammation--unusual weakness or fatigue, shortness of breath, chest pain, fast or irregular heartbeat, dizziness, swelling of the ankles, feet, or hands Hormone gland problems--headache, sensitivity to light, unusual weakness or fatigue, dizziness, fast or irregular heartbeat, increased sensitivity to cold or heat, excessive sweating, constipation, hair loss, increased thirst or amount of urine, tremors or shaking, irritability Infusion reactions--chest pain, shortness of breath or trouble breathing, feeling faint or lightheaded Kidney injury (glomerulonephritis)--decrease in the amount of urine, red or dark brown urine, foamy or bubbly urine, swelling of the ankles, hands, or feet Liver injury--right upper belly pain,  loss of appetite, nausea, light-colored stool, dark yellow or brown urine, yellowing skin or eyes, unusual weakness or fatigue Pain, tingling, or numbness in the hands or feet, muscle weakness, change in vision, confusion or trouble speaking, loss of balance or coordination, trouble walking, seizures Rash, fever, and swollen lymph nodes Redness, blistering, peeling, or loosening of the skin, including inside the mouth Sudden or severe stomach pain, bloody diarrhea, fever, nausea,  vomiting Side effects that usually do not require medical attention (report to your care team if they continue or are bothersome): Bone, joint, or muscle pain Diarrhea Fatigue Loss of appetite Nausea Skin rash This list may not describe all possible side effects. Call your doctor for medical advice about side effects. You may report side effects to FDA at 1-800-FDA-1088. Where should I keep my medication? This medication is given in a hospital or clinic. It will not be stored at home. NOTE: This sheet is a summary. It may not cover all possible information. If you have questions about this medicine, talk to your doctor, pharmacist, or health care provider.  2024 Elsevier/Gold Standard (2022-02-17 00:00:00)

## 2023-10-18 NOTE — Progress Notes (Signed)
[facility] CONSULT NOTE  Patient Care Team: Pcp, No as PCP - General Debbe Odea, MD as PCP - Cardiology (Cardiology) Salena Saner, MD as Consulting Physician (Pulmonary Disease) Glory Buff, RN as Oncology Nurse Navigator Michaelyn Barter, MD as Consulting Physician (Oncology)   CANCER STAGING   Cancer Staging  Stage IV adenocarcinoma of lung [facility]) Staging form: Lung, AJCC 8th Edition - Clinical: Stage IV (cT4, cN3, cM1) - Signed by Michaelyn Barter, MD on 05/20/2023 Stage prefix: Initial diagnosis   ASSESSMENT & PLAN:  Austin Patterson 67 y.o. 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  Pathology showed poorly differentiated carcinoma with spindle cell component.  Tumor cells positive for CK and focal TTF-1.  The differential diagnosis includes pleomorphic carcinoma and carcinosarcoma.  Definitive classification cannot be performed on the biopsy specimen.  -Tempus testing showed PD-L1 0% with no targetable mutations.  -Patient declined second opinion with [facility] or [facility] due to transportation issues/social support.  Considering there was a significant carcinoma component, we decided to treat like lung cancer.  -Started on dose reduced carboplatin, Taxol and Keytruda on 06/01/2023 x 4 cycles.  Poor functional status/ poor tolerance with two hospitalizations for sepsis/ and second for weakness.  -Interval CT scan  showed response to treatment.  Labs reviewed and acceptable.  Will proceed with maintenance Keytruda 200 mg every 3 weeks.  CT imaging scheduled for February 7.  # Chronic respiratory failure on 2 L oxygen # Asthma and COPD overlap # Idiopathic pulmonary fibrosis -Management per pulmonary.  On Dupixent  # Hypocalcemia -Vitamin D level is normal -Continue with calcium vitamin D supplements.  # Hyponatremia -Chronic in nature.  Sodium 129 at baseline.  # Iron deficiency anemia -Completed IV Venofer x 5 doses.  Hemoglobin improved to 12.  Iron panel pending  # Cancer related pain -In mid chest.  Continue with oxycodone 5 mg Q8 as needed.   #CAD- on aspirin, plavix  # IV access-port removed on 06/14/23 due to sepsis.   -Inserted on left side on 07/30/23.  Orders Placed This Encounter  Procedures   CT CHEST ABDOMEN PELVIS W CONTRAST    Standing Status:   Future    Expected Date:   11/26/2023    Expiration Date:   10/17/2024    If indicated for the ordered procedure, I authorize the administration of contrast media per Radiology protocol:   Yes    Does the patient have a contrast media/X-ray dye allergy?:   No    Preferred imaging location?:   [facility]    If indicated for the ordered procedure, I authorize the administration of oral contrast media per Radiology protocol:   Yes   CBC with Differential (Cancer Center Only)    Standing Status:   Future    Expected Date:   11/08/2023    Expiration Date:   11/07/2024   CMP (Cancer Center only)    Standing Status:   Future    Expected Date:   11/08/2023  Expiration Date:   11/07/2024   CBC with Differential (Cancer Center Only)    Standing Status:   Future    Expected Date:   11/29/2023    Expiration Date:   11/28/2024   CMP (Cancer Center only)    Standing Status:   Future    Expected Date:   11/29/2023    Expiration Date:   11/28/2024   T4    Standing Status:   Future    Expected Date:   11/29/2023    Expiration Date:    11/28/2024   TSH    Standing Status:   Future    Expected Date:   11/29/2023    Expiration Date:   11/28/2024   RTC in 3 weeks for MD visit, labs, cycle 8 Keytruda  The total time spent in the appointment was 30 minutes encounter with patients including review of chart and various tests results, discussions about plan of care and coordination of care plan   All questions were answered. The patient knows to call the clinic with any problems, questions or concerns. No barriers to learning was detected.  Michaelyn Barter, MD 12/30/202412:23 PM   HISTORY OF PRESENTING ILLNESS:  Austin Patterson 67 y.o. 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  Patient was seen today in the clinic for further evaluation.  He is a remote smoker quit in 2016.  Prior smoked 2 packs a day since age 47.  He is on 2 L oxygen since 2017.  Lives alone.  Was in a wheelchair.  Does not have family close by.  Follows with Dr. Jayme Cloud.   Interval history Patient seen today as follow-up for MD visit, labs, Keytruda. Patient is tolerating Keytruda well.  Reports shortness of breath more prominent than usual.  May be related to weather.  Having pain in the central part of the chest which is chronic.  Requesting for oxycodone refill.   I have reviewed his chart and materials related to his cancer extensively and collaborated history with the patient. Summary of oncologic history is as follows:  Oncology History  Stage IV adenocarcinoma of lung (HCC)  05/18/2023 Initial Diagnosis   Sarcomatoid carcinoma of lung (HCC)    05/20/2023 Cancer Staging   Staging form: Lung, AJCC 8th Edition - Clinical: Stage IV (cT4, cN3, cM1) - Signed by Michaelyn Barter, MD on 05/20/2023 Stage prefix: Initial diagnosis   06/01/2023 -  Chemotherapy   Patient is on Treatment Plan : LUNG NSCLC Carboplatin (6) + Paclitaxel (200) + Pembrolizumab (200) D1 q21d x 4 cycles / Pembrolizumab (200) Maintenance D1 q21d       MEDICAL HISTORY:  Past Medical History:  Diagnosis Date   Abdominal pain 12/07/2022   Acute urinary retention 12/07/2022   Chest pain 01/24/2022   COPD (chronic obstructive pulmonary disease) (HCC)    GERD (gastroesophageal reflux disease)    Hemothorax on right 05/11/2023   Hyperlipidemia    Hypertension    Pleuritic pain 05/10/2023   Pneumothorax after biopsy 05/11/2023   Post procedure discomfort 05/13/2023   Pulmonary fibrosis (HCC) 11/2015    SURGICAL HISTORY: Past Surgical History:  Procedure Laterality Date  COLONOSCOPY     CORONARY STENT PLACEMENT     ESOPHAGOGASTRODUODENOSCOPY (EGD) WITH PROPOFOL N/A 09/23/2016   Procedure: ESOPHAGOGASTRODUODENOSCOPY (EGD) WITH PROPOFOL;  Surgeon: Wyline Mood, MD;  Location: [facility] ENDOSCOPY;  Service: Endoscopy;  Laterality: N/A;   IR IMAGING GUIDED PORT INSERTION  05/26/2023   IR IMAGING GUIDED PORT INSERTION  07/30/2023   IR RADIOLOGIST EVAL & MGMT  07/20/2023   KYPHOPLASTY N/A 03/14/2020   Procedure: T7 & T11 KYPHOPLASTY;  Surgeon: Kennedy Bucker, MD;  Location: [facility] ORS;  Service: Orthopedics;  Laterality: N/A;   PORT-A-CATH REMOVAL N/A 06/14/2023   Procedure: REMOVAL PORT-A-CATH;  Surgeon: Sung Amabile, DO;  Location: [facility] ORS;  Service: General;  Laterality: N/A;   SHOULDER ACROMIOPLASTY      SOCIAL HISTORY: Social History   Socioeconomic History   Marital status: Legally Separated    Spouse name: Not on file   Number of children: Not on file   Years of education: Not on file   Highest education level: Not on file  Occupational History   Not on file   Tobacco Use   Smoking status: Former    Current packs/day: 0.00    Average packs/day: 2.0 packs/day for 50.0 years (100.0 ttl pk-yrs)    Types: Cigarettes    Start date: 10/13/1965    Quit date: 10/14/2015    Years since quitting: 8.0   Smokeless tobacco: Former  Building services engineer status: Never Used  Substance and Sexual Activity   Alcohol use: Yes    Alcohol/week: 56.0 standard drinks of alcohol    Types: 56 Cans of beer per week    Comment: "I sit around and drink beer, that's all I got to do"   Drug use: No   Sexual activity: Not Currently  Other Topics Concern   Not on file  Social History Narrative   Not on file   Social Drivers of Health   Financial Resource Strain: Not on file  Food Insecurity: No Food Insecurity (06/12/2023)   Hunger Vital Sign    Worried About Running Out of Food in the Last Year: Never true    Ran Out of Food in the Last Year: Never true  Transportation Needs: Unmet Transportation Needs (08/04/2023)   PRAPARE - Administrator, Civil Service (Medical): Yes    Lack of Transportation (Non-Medical): No  Physical Activity: Not on file  Stress: Not on file  Social Connections: Not on file  Intimate Partner Violence: Not At Risk (06/12/2023)   Humiliation, Afraid, Rape, and Kick questionnaire    Fear of Current or Ex-Partner: No    Emotionally Abused: No    Physically Abused: No    Sexually Abused: No    FAMILY HISTORY: Family History  Problem Relation Age of Onset   Heart disease Mother     ALLERGIES:  is allergic to amoxicillin and tizanidine.  MEDICATIONS:  Current Outpatient Medications  Medication Sig Dispense Refill   acetaminophen (TYLENOL) 500 MG tablet Take 1,000 mg by mouth every 6 (six) hours as needed for moderate pain or headache.     albuterol (VENTOLIN HFA) 108 (90 Base) MCG/ACT inhaler Inhale 2 puffs into the lungs every 6 (six) hours as needed for wheezing or shortness of breath. 8 g 11   amLODipine (NORVASC)  5 MG tablet Take 1 tablet (5 mg total) by mouth at bedtime. Skip the dose if systolic BP less than 130 mmHg 30 tablet 5   aspirin EC 81 MG tablet  Take 1 tablet (81 mg total) by mouth daily. 90 tablet 3   atorvastatin (LIPITOR) 40 MG tablet Take 1 tablet (40 mg total) by mouth at bedtime. 90 tablet 3   CALCIUM 600/VITAMIN D 600-10 MG-MCG TABS Take 1 tablet by mouth 2 (two) times daily.     clopidogrel (PLAVIX) 75 MG tablet Take 1 tablet (75 mg total) by mouth daily. 90 tablet 3   Dupilumab (DUPIXENT) 300 MG/2ML SOPN Inject 300 mg into the skin every 14 (fourteen) days. 12 mL 1   EPINEPHrine 0.3 mg/0.3 mL IJ SOAJ injection Inject 0.3 mg into the muscle as needed for anaphylaxis.     fluticasone-salmeterol (ADVAIR DISKUS) 250-50 MCG/ACT AEPB Inhale 1 puff into the lungs in the morning and at bedtime. 60 each 11   gabapentin (NEURONTIN) 300 MG capsule TAKE 1 CAPSULE BY MOUTH THREE TIMES A DAY 90 capsule 1   guaiFENesin (ROBITUSSIN) 100 MG/5ML liquid Take 5 mLs by mouth every 4 (four) hours as needed for cough or to loosen phlegm. 236 mL 1   guaiFENesin-dextromethorphan (ROBITUSSIN DM) 100-10 MG/5ML syrup Take 5 mLs by mouth every 4 (four) hours as needed for cough. 118 mL 0   ipratropium-albuterol (DUONEB) 0.5-2.5 (3) MG/3ML SOLN INHALE 1 VIAL THROUGH NEBULIZER EVERY 6 HOURS 270 mL 2   lidocaine-prilocaine (EMLA) cream Apply to affected area once 30 g 3   magnesium oxide (MAG-OX) 400 (241.3 Mg) MG tablet Take 1 tablet (400 mg total) by mouth daily. 30 tablet 0   metoprolol succinate (TOPROL-XL) 100 MG 24 hr tablet Take 1 tablet (100 mg total) by mouth daily. 30 tablet 11   montelukast (SINGULAIR) 10 MG tablet Take 10 mg by mouth daily.     Multiple Vitamin (MULTIVITAMIN WITH MINERALS) TABS tablet Take 1 tablet by mouth daily. 30 tablet 0   ondansetron (ZOFRAN) 8 MG tablet Take 1 tablet (8 mg total) by mouth every 8 (eight) hours as needed for nausea or vomiting. Start on the third day after carboplatin.  30 tablet 1   pantoprazole (PROTONIX) 40 MG tablet Take 1 tablet (40 mg total) by mouth 2 (two) times daily. 90 tablet 3   prochlorperazine (COMPAZINE) 10 MG tablet Take 1 tablet (10 mg total) by mouth every 6 (six) hours as needed for nausea or vomiting. 30 tablet 1   senna-docusate (SENOKOT-S) 8.6-50 MG tablet Take 1 tablet by mouth 2 (two) times daily. 30 tablet 1   Spacer/Aero-Holding Chambers (AEROCHAMBER MV) inhaler Use as instructed 1 each 0   ferrous sulfate 325 (65 FE) MG tablet Take 1 tablet (325 mg total) by mouth 2 (two) times daily with a meal. 60 tablet 2   oxyCODONE (OXY IR/ROXICODONE) 5 MG immediate release tablet Take 1 tablet (5 mg total) by mouth every 8 (eight) hours as needed for severe pain (pain score 7-10). 90 tablet 0   No current facility-administered medications for this visit.    REVIEW OF SYSTEMS:   Pertinent information mentioned in HPI All other systems were reviewed with the patient and are negative.  PHYSICAL EXAMINATION: ECOG PERFORMANCE STATUS: 3   Vitals:   10/18/23 0914  BP: (!) 149/76  Pulse: 89  Temp: (!) 97.2 F (36.2 C)  SpO2: 97%    Filed Weights   10/18/23 0914  Weight: 197 lb (89.4 kg)     GENERAL:alert, no distress and comfortable SKIN: skin color, texture, turgor are normal, no rashes or significant lesions EYES: normal, conjunctiva are pink and non-injected, sclera clear  OROPHARYNX:no exudate, no erythema and lips, buccal mucosa, and tongue normal  NECK: supple, thyroid normal size, non-tender, without nodularity LYMPH:  no palpable lymphadenopathy in the cervical, axillary or inguinal LUNGS: clear to auscultation and percussion with normal breathing effort HEART: regular rate & rhythm and no murmurs and no lower extremity edema ABDOMEN:abdomen soft, non-tender and normal bowel sounds Musculoskeletal:no cyanosis of digits and no clubbing  PSYCH: alert & oriented x 3 with fluent speech NEURO: no focal motor/sensory  deficits  LABORATORY DATA:  I have reviewed the data as listed Lab Results  Component Value Date   WBC 6.9 10/18/2023   HGB 12.0 (L) 10/18/2023   HCT 36.2 (L) 10/18/2023   MCV 100.6 (H) 10/18/2023   PLT 247 10/18/2023   Recent Labs    06/16/23 0824 06/17/23 0329 09/06/23 0907 09/27/23 1258 10/18/23 0909  NA 133*   < > 135 135 134*  K 3.5   < > 3.5 3.5 4.0  CL 98   < > 98 100 97*  CO2 28   < > 28 25 27   GLUCOSE 152*   < > 125* 138* 114*  BUN 10   < > 15 15 12   CREATININE 0.55*   < > 0.68 0.73 0.67  CALCIUM 7.0*   < > 7.6* 8.8* 9.1  GFRNONAA >60   < > >60 >60 >60  PROT 6.5   < > 6.5 6.9 7.5  ALBUMIN 2.9*   < > 4.0 4.2 4.4  AST 18   < > 25 19 20   ALT 25   < > 37 19 17  ALKPHOS 87   < > 53 48 43  BILITOT 0.3   < > 0.7 0.6 0.5  BILIDIR <0.1  --   --   --   --   IBILI NOT CALCULATED  --   --   --   --    < > = values in this interval not displayed.    RADIOGRAPHIC STUDIES: I have personally reviewed the radiological images as listed and agreed with the findings in the report. No results found.

## 2023-10-18 NOTE — Progress Notes (Signed)
O2-2L. Having more trouble breathing.  Ran out of oxy 5mg , taking 1-2 per day. Refill pended.  Loose stool x1 week.

## 2023-11-04 ENCOUNTER — Encounter: Payer: Self-pay | Admitting: Internal Medicine

## 2023-11-08 ENCOUNTER — Inpatient Hospital Stay: Payer: Medicare HMO

## 2023-11-08 ENCOUNTER — Inpatient Hospital Stay (HOSPITAL_BASED_OUTPATIENT_CLINIC_OR_DEPARTMENT_OTHER): Payer: Medicare HMO | Admitting: Internal Medicine

## 2023-11-08 ENCOUNTER — Inpatient Hospital Stay: Payer: Medicare HMO | Attending: Internal Medicine

## 2023-11-08 ENCOUNTER — Encounter: Payer: Self-pay | Admitting: Internal Medicine

## 2023-11-08 VITALS — BP 166/78 | HR 82 | Resp 16

## 2023-11-08 VITALS — BP 141/72 | HR 87 | Temp 98.3°F | Resp 14 | Wt 197.0 lb

## 2023-11-08 DIAGNOSIS — C342 Malignant neoplasm of middle lobe, bronchus or lung: Secondary | ICD-10-CM | POA: Diagnosis present

## 2023-11-08 DIAGNOSIS — Z7982 Long term (current) use of aspirin: Secondary | ICD-10-CM | POA: Insufficient documentation

## 2023-11-08 DIAGNOSIS — Z79899 Other long term (current) drug therapy: Secondary | ICD-10-CM | POA: Insufficient documentation

## 2023-11-08 DIAGNOSIS — G893 Neoplasm related pain (acute) (chronic): Secondary | ICD-10-CM | POA: Diagnosis not present

## 2023-11-08 DIAGNOSIS — Z5112 Encounter for antineoplastic immunotherapy: Secondary | ICD-10-CM | POA: Diagnosis not present

## 2023-11-08 DIAGNOSIS — C3491 Malignant neoplasm of unspecified part of right bronchus or lung: Secondary | ICD-10-CM

## 2023-11-08 DIAGNOSIS — Z7951 Long term (current) use of inhaled steroids: Secondary | ICD-10-CM | POA: Insufficient documentation

## 2023-11-08 DIAGNOSIS — Z7902 Long term (current) use of antithrombotics/antiplatelets: Secondary | ICD-10-CM | POA: Insufficient documentation

## 2023-11-08 DIAGNOSIS — Z87891 Personal history of nicotine dependence: Secondary | ICD-10-CM | POA: Insufficient documentation

## 2023-11-08 DIAGNOSIS — E871 Hypo-osmolality and hyponatremia: Secondary | ICD-10-CM | POA: Insufficient documentation

## 2023-11-08 DIAGNOSIS — I2510 Atherosclerotic heart disease of native coronary artery without angina pectoris: Secondary | ICD-10-CM | POA: Insufficient documentation

## 2023-11-08 DIAGNOSIS — D509 Iron deficiency anemia, unspecified: Secondary | ICD-10-CM | POA: Insufficient documentation

## 2023-11-08 LAB — CBC WITH DIFFERENTIAL (CANCER CENTER ONLY)
Abs Immature Granulocytes: 0.03 10*3/uL (ref 0.00–0.07)
Basophils Absolute: 0 10*3/uL (ref 0.0–0.1)
Basophils Relative: 1 %
Eosinophils Absolute: 0.3 10*3/uL (ref 0.0–0.5)
Eosinophils Relative: 4 %
HCT: 37 % — ABNORMAL LOW (ref 39.0–52.0)
Hemoglobin: 12.1 g/dL — ABNORMAL LOW (ref 13.0–17.0)
Immature Granulocytes: 0 %
Lymphocytes Relative: 13 %
Lymphs Abs: 1.1 10*3/uL (ref 0.7–4.0)
MCH: 33.2 pg (ref 26.0–34.0)
MCHC: 32.7 g/dL (ref 30.0–36.0)
MCV: 101.6 fL — ABNORMAL HIGH (ref 80.0–100.0)
Monocytes Absolute: 0.9 10*3/uL (ref 0.1–1.0)
Monocytes Relative: 12 %
Neutro Abs: 5.7 10*3/uL (ref 1.7–7.7)
Neutrophils Relative %: 70 %
Platelet Count: 232 10*3/uL (ref 150–400)
RBC: 3.64 MIL/uL — ABNORMAL LOW (ref 4.22–5.81)
RDW: 13.8 % (ref 11.5–15.5)
WBC Count: 8.1 10*3/uL (ref 4.0–10.5)
nRBC: 0 % (ref 0.0–0.2)

## 2023-11-08 LAB — CMP (CANCER CENTER ONLY)
ALT: 18 U/L (ref 0–44)
AST: 23 U/L (ref 15–41)
Albumin: 4.4 g/dL (ref 3.5–5.0)
Alkaline Phosphatase: 50 U/L (ref 38–126)
Anion gap: 9 (ref 5–15)
BUN: 15 mg/dL (ref 8–23)
CO2: 27 mmol/L (ref 22–32)
Calcium: 8.9 mg/dL (ref 8.9–10.3)
Chloride: 96 mmol/L — ABNORMAL LOW (ref 98–111)
Creatinine: 0.83 mg/dL (ref 0.61–1.24)
GFR, Estimated: >60 mL/min (ref >60)
Glucose, Bld: 115 mg/dL — ABNORMAL HIGH (ref 70–99)
Potassium: 4.2 mmol/L (ref 3.5–5.1)
Sodium: 132 mmol/L — ABNORMAL LOW (ref 135–145)
Total Bilirubin: 0.5 mg/dL (ref 0.0–1.2)
Total Protein: 7.4 g/dL (ref 6.5–8.1)

## 2023-11-08 MED ORDER — HEPARIN SOD (PORK) LOCK FLUSH 100 UNIT/ML IV SOLN
500.0000 [IU] | Freq: Once | INTRAVENOUS | Status: AC | PRN
  Administered 2023-11-08: 500 [IU]
  Filled 2023-11-08: qty 5

## 2023-11-08 MED ORDER — SODIUM CHLORIDE 0.9 % IV SOLN
Freq: Once | INTRAVENOUS | Status: AC
  Filled 2023-11-08: qty 250

## 2023-11-08 MED ORDER — SODIUM CHLORIDE 0.9 % IV SOLN
200.0000 mg | Freq: Once | INTRAVENOUS | Status: AC
  Administered 2023-11-08: 200 mg via INTRAVENOUS
  Filled 2023-11-08: qty 200

## 2023-11-08 NOTE — Progress Notes (Unsigned)
Patient states that his breathing has worsened. Swelling in both feet, off and on for a little while now. He feels like the gabapentin isn't working. Right hand has also been giving him problems like it cramps up and hurts. He feels like the asthma injection is what is making his feet hurt and swell.

## 2023-11-08 NOTE — Patient Instructions (Signed)
 [facility] - A DEPT OF [facility]  Discharge Instructions: Thank you for choosing [facility] to provide your oncology and hematology care.  If you have a lab appointment with the Cancer Center, please go directly to the Cancer Center and check in at the registration area.  Wear comfortable clothing and clothing appropriate for easy access to any Portacath or PICC line.   We strive to give you quality time with your provider. You may need to reschedule your appointment if you arrive late (15 or more minutes).  Arriving late affects you and other patients whose appointments are after yours.  Also, if you miss three or more appointments without notifying the office, you may be dismissed from the clinic at the provider's discretion.      For prescription refill requests, have your pharmacy contact our office and allow 72 hours for refills to be completed.    Today you received the following chemotherapy and/or immunotherapy agents KEYTRUDA      To help prevent nausea and vomiting after your treatment, we encourage you to take your nausea medication as directed.  BELOW ARE SYMPTOMS THAT SHOULD BE REPORTED IMMEDIATELY: *FEVER GREATER THAN 100.4 F (38 C) OR HIGHER *CHILLS OR SWEATING *NAUSEA AND VOMITING THAT IS NOT CONTROLLED WITH YOUR NAUSEA MEDICATION *UNUSUAL SHORTNESS OF BREATH *UNUSUAL BRUISING OR BLEEDING *URINARY PROBLEMS (pain or burning when urinating, or frequent urination) *BOWEL PROBLEMS (unusual diarrhea, constipation, pain near the anus) TENDERNESS IN MOUTH AND THROAT WITH OR WITHOUT PRESENCE OF ULCERS (sore throat, sores in mouth, or a toothache) UNUSUAL RASH, SWELLING OR PAIN  UNUSUAL VAGINAL DISCHARGE OR ITCHING   Items with * indicate a potential emergency and should be followed up as soon as possible or go to the Emergency Department if any problems should occur.  Please show the CHEMOTHERAPY ALERT CARD or IMMUNOTHERAPY  ALERT CARD at check-in to the Emergency Department and triage nurse.  Should you have questions after your visit or need to cancel or reschedule your appointment, please contact [facility] - A DEPT OF [facility]  [phone] and follow the prompts.  Office hours are 8:00 a.m. to 4:30 p.m. Monday - Friday. Please note that voicemails left after 4:00 p.m. may not be returned until the following business day.  We are closed weekends and major holidays. You have access to a nurse at all times for urgent questions. Please call the main number to the clinic [phone] and follow the prompts.  For any non-urgent questions, you may also contact your provider using MyChart. We now offer e-Visits for anyone 71 and older to request care online for non-urgent symptoms. For details visit [email or web address].   Also download the MyChart app! Go to the app store, search "MyChart", open the app, select [place], and log in with your MyChart username and password.   Pembrolizumab Injection What is this medication? PEMBROLIZUMAB (PEM broe LIZ ue mab) treats some types of cancer. It works by helping your immune system slow or stop the spread of cancer cells. It is a monoclonal antibody. This medicine may be used for other purposes; ask your health care provider or pharmacist if you have questions. COMMON BRAND NAME(S): Keytruda What should I tell my care team before I take this medication? They need to know if you have any of these conditions: Allogeneic stem cell transplant (uses someone else's stem cells) Autoimmune diseases, such as Crohn  disease, ulcerative colitis, lupus History of chest radiation Nervous system problems, such as Guillain-Barre syndrome, myasthenia gravis Organ transplant An unusual or allergic reaction to pembrolizumab, other medications, foods, dyes, or preservatives Pregnant or trying to get pregnant Breast-feeding How should I use this  medication? This medication is injected into a vein. It is given by your care team in a hospital or clinic setting. A special MedGuide will be given to you before each treatment. Be sure to read this information carefully each time. Talk to your care team about the use of this medication in children. While it may be prescribed for children as young as 6 months for selected conditions, precautions do apply. Overdosage: If you think you have taken too much of this medicine contact a poison control center or emergency room at once. NOTE: This medicine is only for you. Do not share this medicine with others. What if I miss a dose? Keep appointments for follow-up doses. It is important not to miss your dose. Call your care team if you are unable to keep an appointment. What may interact with this medication? Interactions have not been studied. This list may not describe all possible interactions. Give your health care provider a list of all the medicines, herbs, non-prescription drugs, or dietary supplements you use. Also tell them if you smoke, drink alcohol, or use illegal drugs. Some items may interact with your medicine. What should I watch for while using this medication? Your condition will be monitored carefully while you are receiving this medication. You may need blood work while taking this medication. This medication may cause serious skin reactions. They can happen weeks to months after starting the medication. Contact your care team right away if you notice fevers or flu-like symptoms with a rash. The rash may be red or purple and then turn into blisters or peeling of the skin. You may also notice a red rash with swelling of the face, lips, or lymph nodes in your neck or under your arms. Tell your care team right away if you have any change in your eyesight. Talk to your care team if you may be pregnant. Serious birth defects can occur if you take this medication during pregnancy and for 4  months after the last dose. You will need a negative pregnancy test before starting this medication. Contraception is recommended while taking this medication and for 4 months after the last dose. Your care team can help you find the option that works for you. Do not breastfeed while taking this medication and for 4 months after the last dose. What side effects may I notice from receiving this medication? Side effects that you should report to your care team as soon as possible: Allergic reactions--skin rash, itching, hives, swelling of the face, lips, tongue, or throat Dry cough, shortness of breath or trouble breathing Eye pain, redness, irritation, or discharge with blurry or decreased vision Heart muscle inflammation--unusual weakness or fatigue, shortness of breath, chest pain, fast or irregular heartbeat, dizziness, swelling of the ankles, feet, or hands Hormone gland problems--headache, sensitivity to light, unusual weakness or fatigue, dizziness, fast or irregular heartbeat, increased sensitivity to cold or heat, excessive sweating, constipation, hair loss, increased thirst or amount of urine, tremors or shaking, irritability Infusion reactions--chest pain, shortness of breath or trouble breathing, feeling faint or lightheaded Kidney injury (glomerulonephritis)--decrease in the amount of urine, red or dark brown urine, foamy or bubbly urine, swelling of the ankles, hands, or feet Liver injury--right upper belly pain,  loss of appetite, nausea, light-colored stool, dark yellow or brown urine, yellowing skin or eyes, unusual weakness or fatigue Pain, tingling, or numbness in the hands or feet, muscle weakness, change in vision, confusion or trouble speaking, loss of balance or coordination, trouble walking, seizures Rash, fever, and swollen lymph nodes Redness, blistering, peeling, or loosening of the skin, including inside the mouth Sudden or severe stomach pain, bloody diarrhea, fever, nausea,  vomiting Side effects that usually do not require medical attention (report to your care team if they continue or are bothersome): Bone, joint, or muscle pain Diarrhea Fatigue Loss of appetite Nausea Skin rash This list may not describe all possible side effects. Call your doctor for medical advice about side effects. You may report side effects to FDA at 1-800-FDA-1088. Where should I keep my medication? This medication is given in a hospital or clinic. It will not be stored at home. NOTE: This sheet is a summary. It may not cover all possible information. If you have questions about this medicine, talk to your doctor, pharmacist, or health care provider.  2024 Elsevier/Gold Standard (2022-02-17 00:00:00)

## 2023-11-09 ENCOUNTER — Encounter: Payer: Self-pay | Admitting: Internal Medicine

## 2023-11-09 NOTE — Progress Notes (Signed)
[facility] CONSULT NOTE  Patient Care Team: Pcp, No as PCP - General Debbe Odea, MD as PCP - Cardiology (Cardiology) Salena Saner, MD as Consulting Physician (Pulmonary Disease) Glory Buff, RN as Oncology Nurse Navigator Michaelyn Barter, MD as Consulting Physician (Oncology)   CANCER STAGING   Cancer Staging  Stage IV adenocarcinoma of lung [facility]) Staging form: Lung, AJCC 8th Edition - Clinical: Stage IV (cT4, cN3, cM1) - Signed by Michaelyn Barter, MD on 05/20/2023 Stage prefix: Initial diagnosis   ASSESSMENT & PLAN:  Austin Patterson 68 y.o. 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  Pathology showed poorly differentiated carcinoma with spindle cell component.  Tumor cells positive for CK and focal TTF-1.  The differential diagnosis includes pleomorphic carcinoma and carcinosarcoma.  Definitive classification cannot be performed on the biopsy specimen.  -Tempus testing showed PD-L1 0% with no targetable mutations.  -Patient declined second opinion with [facility] or [facility] due to transportation issues/social support.  Considering there was a significant carcinoma component, we decided to treat like lung cancer.  -Started on dose reduced carboplatin, Taxol and Keytruda on 06/01/2023 x 4 cycles.  Poor functional status/ poor tolerance with two hospitalizations for sepsis/ and second for weakness.  -Interval CT scan  showed response to treatment.  Labs reviewed and acceptable.  Will proceed with maintenance Keytruda 200 mg every 3 weeks.  CT imaging scheduled for February 7.  # Chronic respiratory failure on 2 L oxygen # Asthma and COPD overlap # Idiopathic pulmonary fibrosis -Management per pulmonary.  On Dupixent  # Hypocalcemia -Vitamin D level is normal -Continue with calcium vitamin D supplements.  # Hyponatremia -Chronic in nature.  Sodium 129 at baseline.  # Iron deficiency anemia -Completed IV Venofer x 5 doses.  Hemoglobin improved to 12.  Iron panel has normalized.  # Cancer related pain -In mid chest.  Continue with oxycodone 5 mg Q8 as needed.   #CAD- on aspirin, plavix  # IV access-port removed on 06/14/23 due to sepsis.   -Inserted on left side on 07/30/23.  No orders of the defined types were placed in this encounter.  RTC in 3 weeks for MD visit, labs, cycle 8 Keytruda  The total time spent in the appointment was 30 minutes encounter with patients including review of chart and various tests results, discussions about plan of care and coordination of care plan   All questions were answered. The patient knows to call the clinic with any problems, questions or concerns. No barriers to learning was detected.  Michaelyn Barter, MD 1/21/20252:33 PM   HISTORY OF PRESENTING ILLNESS:  Austin Patterson 68 y.o. 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  Patient was seen today in the clinic for further evaluation.  He is a remote smoker quit in 2016.  Prior smoked 2 packs a day since  age 28.  He is on 2 L oxygen since 2017.  Lives alone.  Was in a wheelchair.  Does not have family close by.  Follows with Dr. Jayme Cloud.   Interval history Patient seen today as follow-up for MD visit, labs, Keytruda. Patient reports neuropathic pain in bilateral feet which tends to get worse when he takes Dupixent injection.  He is already on gabapentin 300 mg 3 times a day.  He is also taking oxycodone twice a day to help with the chest pain from the lung mass.  Otherwise he has been doing well.   I have reviewed his chart and materials related to his cancer extensively and collaborated history with the patient. Summary of oncologic history is as follows:  Oncology History  Stage IV adenocarcinoma of lung (HCC)  05/18/2023 Initial Diagnosis   Sarcomatoid carcinoma of lung (HCC)   05/20/2023 Cancer Staging   Staging form: Lung, AJCC 8th Edition - Clinical: Stage IV (cT4, cN3, cM1) - Signed by Michaelyn Barter, MD on 05/20/2023 Stage prefix: Initial diagnosis   06/01/2023 -  Chemotherapy   Patient is on Treatment Plan : LUNG NSCLC Carboplatin (6) + Paclitaxel (200) + Pembrolizumab (200) D1 q21d x 4 cycles / Pembrolizumab (200) Maintenance D1 q21d       MEDICAL HISTORY:  Past Medical History:  Diagnosis Date   Abdominal pain 12/07/2022   Acute urinary retention 12/07/2022   Chest pain 01/24/2022   COPD (chronic obstructive pulmonary disease) (HCC)    GERD (gastroesophageal reflux disease)    Hemothorax on right 05/11/2023   Hyperlipidemia    Hypertension    Pleuritic pain 05/10/2023   Pneumothorax after biopsy 05/11/2023   Post procedure discomfort 05/13/2023   Pulmonary fibrosis (HCC) 11/2015    SURGICAL HISTORY: Past Surgical History:  Procedure Laterality Date   COLONOSCOPY     CORONARY STENT PLACEMENT     ESOPHAGOGASTRODUODENOSCOPY (EGD) WITH PROPOFOL N/A 09/23/2016   Procedure: ESOPHAGOGASTRODUODENOSCOPY (EGD) WITH PROPOFOL;  Surgeon: Wyline Mood, MD;  Location: [facility]  ENDOSCOPY;  Service: Endoscopy;  Laterality: N/A;   IR IMAGING GUIDED PORT INSERTION  05/26/2023   IR IMAGING GUIDED PORT INSERTION  07/30/2023   IR RADIOLOGIST EVAL & MGMT  07/20/2023   KYPHOPLASTY N/A 03/14/2020   Procedure: T7 & T11 KYPHOPLASTY;  Surgeon: Kennedy Bucker, MD;  Location: [facility] ORS;  Service: Orthopedics;  Laterality: N/A;   PORT-A-CATH REMOVAL N/A 06/14/2023   Procedure: REMOVAL PORT-A-CATH;  Surgeon: Sung Amabile, DO;  Location: [facility] ORS;  Service: General;  Laterality: N/A;   SHOULDER ACROMIOPLASTY      SOCIAL HISTORY: Social History   Socioeconomic History   Marital status: Legally Separated    Spouse name: Not on file   Number of children: Not on file   Years of education: Not on file   Highest education level: Not on file  Occupational History   Not on file  Tobacco Use   Smoking status: Former    Current packs/day: 0.00    Average packs/day: 2.0 packs/day for 50.0 years (100.0 ttl pk-yrs)    Types: Cigarettes    Start date: 10/13/1965    Quit date: 10/14/2015    Years since quitting: 8.0   Smokeless tobacco: Former  Building services engineer status: Never Used  Substance and Sexual Activity   Alcohol use: Yes  Alcohol/week: 56.0 standard drinks of alcohol    Types: 56 Cans of beer per week    Comment: "I sit around and drink beer, that's all I got to do"   Drug use: No   Sexual activity: Not Currently  Other Topics Concern   Not on file  Social History Narrative   Not on file   Social Drivers of Health   Financial Resource Strain: Not on file  Food Insecurity: No Food Insecurity (06/12/2023)   Hunger Vital Sign    Worried About Running Out of Food in the Last Year: Never true    Ran Out of Food in the Last Year: Never true  Transportation Needs: Unmet Transportation Needs (08/04/2023)   PRAPARE - Administrator, Civil Service (Medical): Yes    Lack of Transportation (Non-Medical): No  Physical Activity: Not on file  Stress: Not on file   Social Connections: Not on file  Intimate Partner Violence: Not At Risk (06/12/2023)   Humiliation, Afraid, Rape, and Kick questionnaire    Fear of Current or Ex-Partner: No    Emotionally Abused: No    Physically Abused: No    Sexually Abused: No    FAMILY HISTORY: Family History  Problem Relation Age of Onset   Heart disease Mother     ALLERGIES:  is allergic to amoxicillin and tizanidine.  MEDICATIONS:  Current Outpatient Medications  Medication Sig Dispense Refill   omega-3 acid ethyl esters (LOVAZA) 1 g capsule Take 1 g by mouth daily.     acetaminophen (TYLENOL) 500 MG tablet Take 1,000 mg by mouth every 6 (six) hours as needed for moderate pain or headache.     albuterol (VENTOLIN HFA) 108 (90 Base) MCG/ACT inhaler Inhale 2 puffs into the lungs every 6 (six) hours as needed for wheezing or shortness of breath. 8 g 11   amLODipine (NORVASC) 5 MG tablet Take 1 tablet (5 mg total) by mouth at bedtime. Skip the dose if systolic BP less than 130 mmHg 30 tablet 5   aspirin EC 81 MG tablet Take 1 tablet (81 mg total) by mouth daily. 90 tablet 3   atorvastatin (LIPITOR) 40 MG tablet Take 1 tablet (40 mg total) by mouth at bedtime. 90 tablet 3   CALCIUM 600/VITAMIN D 600-10 MG-MCG TABS Take 1 tablet by mouth 2 (two) times daily.     clopidogrel (PLAVIX) 75 MG tablet Take 1 tablet (75 mg total) by mouth daily. 90 tablet 3   Dupilumab (DUPIXENT) 300 MG/2ML SOPN Inject 300 mg into the skin every 14 (fourteen) days. 12 mL 1   EPINEPHrine 0.3 mg/0.3 mL IJ SOAJ injection Inject 0.3 mg into the muscle as needed for anaphylaxis.     ferrous sulfate 325 (65 FE) MG tablet Take 1 tablet (325 mg total) by mouth 2 (two) times daily with a meal. 60 tablet 2   fluticasone-salmeterol (ADVAIR DISKUS) 250-50 MCG/ACT AEPB Inhale 1 puff into the lungs in the morning and at bedtime. 60 each 11   gabapentin (NEURONTIN) 300 MG capsule TAKE 1 CAPSULE BY MOUTH THREE TIMES A DAY 90 capsule 1   guaiFENesin  (ROBITUSSIN) 100 MG/5ML liquid Take 5 mLs by mouth every 4 (four) hours as needed for cough or to loosen phlegm. 236 mL 1   guaiFENesin-dextromethorphan (ROBITUSSIN DM) 100-10 MG/5ML syrup Take 5 mLs by mouth every 4 (four) hours as needed for cough. 118 mL 0   ipratropium-albuterol (DUONEB) 0.5-2.5 (3) MG/3ML SOLN INHALE 1 VIAL THROUGH  NEBULIZER EVERY 6 HOURS 270 mL 2   lidocaine-prilocaine (EMLA) cream Apply to affected area once 30 g 3   magnesium oxide (MAG-OX) 400 (241.3 Mg) MG tablet Take 1 tablet (400 mg total) by mouth daily. 30 tablet 0   metoprolol succinate (TOPROL-XL) 100 MG 24 hr tablet Take 1 tablet (100 mg total) by mouth daily. 30 tablet 11   montelukast (SINGULAIR) 10 MG tablet Take 10 mg by mouth daily.     Multiple Vitamin (MULTIVITAMIN WITH MINERALS) TABS tablet Take 1 tablet by mouth daily. 30 tablet 0   ondansetron (ZOFRAN) 8 MG tablet Take 1 tablet (8 mg total) by mouth every 8 (eight) hours as needed for nausea or vomiting. Start on the third day after carboplatin. 30 tablet 1   oxyCODONE (OXY IR/ROXICODONE) 5 MG immediate release tablet Take 1 tablet (5 mg total) by mouth every 8 (eight) hours as needed for severe pain (pain score 7-10). 90 tablet 0   pantoprazole (PROTONIX) 40 MG tablet Take 1 tablet (40 mg total) by mouth 2 (two) times daily. 90 tablet 3   prochlorperazine (COMPAZINE) 10 MG tablet Take 1 tablet (10 mg total) by mouth every 6 (six) hours as needed for nausea or vomiting. 30 tablet 1   senna-docusate (SENOKOT-S) 8.6-50 MG tablet Take 1 tablet by mouth 2 (two) times daily. 30 tablet 1   Spacer/Aero-Holding Chambers (AEROCHAMBER MV) inhaler Use as instructed 1 each 0   No current facility-administered medications for this visit.    REVIEW OF SYSTEMS:   Pertinent information mentioned in HPI All other systems were reviewed with the patient and are negative.  PHYSICAL EXAMINATION: ECOG PERFORMANCE STATUS: 3   Vitals:   11/08/23 1313  BP: (!) 141/72   Pulse: 87  Resp: 14  Temp: 98.3 F (36.8 C)  SpO2: 97%    Filed Weights   11/08/23 1313  Weight: 197 lb (89.4 kg)     GENERAL:alert, no distress and comfortable SKIN: skin color, texture, turgor are normal, no rashes or significant lesions EYES: normal, conjunctiva are pink and non-injected, sclera clear OROPHARYNX:no exudate, no erythema and lips, buccal mucosa, and tongue normal  NECK: supple, thyroid normal size, non-tender, without nodularity LYMPH:  no palpable lymphadenopathy in the cervical, axillary or inguinal LUNGS: clear to auscultation and percussion with normal breathing effort HEART: regular rate & rhythm and no murmurs and no lower extremity edema ABDOMEN:abdomen soft, non-tender and normal bowel sounds Musculoskeletal:no cyanosis of digits and no clubbing  PSYCH: alert & oriented x 3 with fluent speech NEURO: no focal motor/sensory deficits  LABORATORY DATA:  I have reviewed the data as listed Lab Results  Component Value Date   WBC 8.1 11/08/2023   HGB 12.1 (L) 11/08/2023   HCT 37.0 (L) 11/08/2023   MCV 101.6 (H) 11/08/2023   PLT 232 11/08/2023   Recent Labs    06/16/23 0824 06/17/23 0329 09/27/23 1258 10/18/23 0909 11/08/23 1237  NA 133*   < > 135 134* 132*  K 3.5   < > 3.5 4.0 4.2  CL 98   < > 100 97* 96*  CO2 28   < > 25 27 27   GLUCOSE 152*   < > 138* 114* 115*  BUN 10   < > 15 12 15   CREATININE 0.55*   < > 0.73 0.67 0.83  CALCIUM 7.0*   < > 8.8* 9.1 8.9  GFRNONAA >60   < > >60 >60 >60  PROT 6.5   < >  6.9 7.5 7.4  ALBUMIN 2.9*   < > 4.2 4.4 4.4  AST 18   < > 19 20 23   ALT 25   < > 19 17 18   ALKPHOS 87   < > 48 43 50  BILITOT 0.3   < > 0.6 0.5 0.5  BILIDIR <0.1  --   --   --   --   IBILI NOT CALCULATED  --   --   --   --    < > = values in this interval not displayed.    RADIOGRAPHIC STUDIES: I have personally reviewed the radiological images as listed and agreed with the findings in the report. No results found.

## 2023-11-13 ENCOUNTER — Other Ambulatory Visit: Payer: Self-pay | Admitting: Pulmonary Disease

## 2023-11-24 ENCOUNTER — Other Ambulatory Visit: Payer: Self-pay | Admitting: *Deleted

## 2023-11-24 DIAGNOSIS — C3491 Malignant neoplasm of unspecified part of right bronchus or lung: Secondary | ICD-10-CM

## 2023-11-24 MED ORDER — OXYCODONE HCL 5 MG PO TABS: 5.0000 mg | ORAL_TABLET | Freq: Three times a day (TID) | ORAL | 0 refills | Status: DC | PRN

## 2023-11-26 ENCOUNTER — Ambulatory Visit
Admission: RE | Admit: 2023-11-26 | Discharge: 2023-11-26 | Disposition: A | Discharge status: Home / Self Care | Payer: Medicare HMO | Source: Ambulatory Visit | Attending: Internal Medicine

## 2023-11-26 ENCOUNTER — Inpatient Hospital Stay: Payer: Medicare HMO | Attending: Internal Medicine

## 2023-11-26 DIAGNOSIS — D509 Iron deficiency anemia, unspecified: Secondary | ICD-10-CM | POA: Insufficient documentation

## 2023-11-26 DIAGNOSIS — E871 Hypo-osmolality and hyponatremia: Secondary | ICD-10-CM | POA: Insufficient documentation

## 2023-11-26 DIAGNOSIS — C342 Malignant neoplasm of middle lobe, bronchus or lung: Secondary | ICD-10-CM | POA: Insufficient documentation

## 2023-11-26 DIAGNOSIS — Z7982 Long term (current) use of aspirin: Secondary | ICD-10-CM | POA: Insufficient documentation

## 2023-11-26 DIAGNOSIS — Z87891 Personal history of nicotine dependence: Secondary | ICD-10-CM | POA: Insufficient documentation

## 2023-11-26 DIAGNOSIS — G893 Neoplasm related pain (acute) (chronic): Secondary | ICD-10-CM | POA: Insufficient documentation

## 2023-11-26 DIAGNOSIS — C3491 Malignant neoplasm of unspecified part of right bronchus or lung: Secondary | ICD-10-CM | POA: Diagnosis present

## 2023-11-26 DIAGNOSIS — Z7902 Long term (current) use of antithrombotics/antiplatelets: Secondary | ICD-10-CM | POA: Insufficient documentation

## 2023-11-26 DIAGNOSIS — I2510 Atherosclerotic heart disease of native coronary artery without angina pectoris: Secondary | ICD-10-CM | POA: Insufficient documentation

## 2023-11-26 DIAGNOSIS — Z79899 Other long term (current) drug therapy: Secondary | ICD-10-CM | POA: Insufficient documentation

## 2023-11-26 MED ORDER — IOHEXOL 300 MG/ML  SOLN
100.0000 mL | Freq: Once | INTRAMUSCULAR | Status: AC | PRN
  Administered 2023-11-26: 100 mL via INTRAVENOUS

## 2023-11-29 ENCOUNTER — Inpatient Hospital Stay (HOSPITAL_BASED_OUTPATIENT_CLINIC_OR_DEPARTMENT_OTHER): Payer: Medicare HMO | Admitting: Internal Medicine

## 2023-11-29 ENCOUNTER — Encounter: Payer: Self-pay | Admitting: Internal Medicine

## 2023-11-29 ENCOUNTER — Inpatient Hospital Stay: Payer: Medicare HMO

## 2023-11-29 VITALS — BP 144/64 | HR 81 | Temp 98.8°F | Resp 18 | Wt 191.0 lb

## 2023-11-29 DIAGNOSIS — C3491 Malignant neoplasm of unspecified part of right bronchus or lung: Secondary | ICD-10-CM | POA: Diagnosis not present

## 2023-11-29 DIAGNOSIS — D509 Iron deficiency anemia, unspecified: Secondary | ICD-10-CM | POA: Diagnosis not present

## 2023-11-29 DIAGNOSIS — G893 Neoplasm related pain (acute) (chronic): Secondary | ICD-10-CM | POA: Diagnosis not present

## 2023-11-29 DIAGNOSIS — E871 Hypo-osmolality and hyponatremia: Secondary | ICD-10-CM | POA: Diagnosis not present

## 2023-11-29 DIAGNOSIS — I2510 Atherosclerotic heart disease of native coronary artery without angina pectoris: Secondary | ICD-10-CM | POA: Diagnosis not present

## 2023-11-29 DIAGNOSIS — Z7982 Long term (current) use of aspirin: Secondary | ICD-10-CM | POA: Diagnosis not present

## 2023-11-29 DIAGNOSIS — Z87891 Personal history of nicotine dependence: Secondary | ICD-10-CM | POA: Diagnosis not present

## 2023-11-29 DIAGNOSIS — Z79899 Other long term (current) drug therapy: Secondary | ICD-10-CM | POA: Diagnosis not present

## 2023-11-29 DIAGNOSIS — C342 Malignant neoplasm of middle lobe, bronchus or lung: Secondary | ICD-10-CM | POA: Diagnosis present

## 2023-11-29 DIAGNOSIS — Z7902 Long term (current) use of antithrombotics/antiplatelets: Secondary | ICD-10-CM | POA: Diagnosis not present

## 2023-11-29 DIAGNOSIS — Z95828 Presence of other vascular implants and grafts: Secondary | ICD-10-CM

## 2023-11-29 LAB — CBC WITH DIFFERENTIAL (CANCER CENTER ONLY)
Abs Immature Granulocytes: 0.11 10*3/uL — ABNORMAL HIGH (ref 0.00–0.07)
Basophils Absolute: 0.1 10*3/uL (ref 0.0–0.1)
Basophils Relative: 1 %
Eosinophils Absolute: 0.2 10*3/uL (ref 0.0–0.5)
Eosinophils Relative: 2 %
HCT: 36.2 % — ABNORMAL LOW (ref 39.0–52.0)
Hemoglobin: 12 g/dL — ABNORMAL LOW (ref 13.0–17.0)
Immature Granulocytes: 1 %
Lymphocytes Relative: 11 %
Lymphs Abs: 1.1 10*3/uL (ref 0.7–4.0)
MCH: 32.2 pg (ref 26.0–34.0)
MCHC: 33.1 g/dL (ref 30.0–36.0)
MCV: 97.1 fL (ref 80.0–100.0)
Monocytes Absolute: 1.4 10*3/uL — ABNORMAL HIGH (ref 0.1–1.0)
Monocytes Relative: 13 %
Neutro Abs: 7.7 10*3/uL (ref 1.7–7.7)
Neutrophils Relative %: 72 %
Platelet Count: 380 10*3/uL (ref 150–400)
RBC: 3.73 MIL/uL — ABNORMAL LOW (ref 4.22–5.81)
RDW: 13 % (ref 11.5–15.5)
WBC Count: 10.6 10*3/uL — ABNORMAL HIGH (ref 4.0–10.5)
nRBC: 0 % (ref 0.0–0.2)

## 2023-11-29 LAB — CMP (CANCER CENTER ONLY)
ALT: 17 U/L (ref 0–44)
AST: 22 U/L (ref 15–41)
Albumin: 3.7 g/dL (ref 3.5–5.0)
Alkaline Phosphatase: 89 U/L (ref 38–126)
Anion gap: 12 (ref 5–15)
BUN: 14 mg/dL (ref 8–23)
CO2: 26 mmol/L (ref 22–32)
Calcium: 8.8 mg/dL — ABNORMAL LOW (ref 8.9–10.3)
Chloride: 91 mmol/L — ABNORMAL LOW (ref 98–111)
Creatinine: 0.73 mg/dL (ref 0.61–1.24)
GFR, Estimated: >60 mL/min (ref >60)
Glucose, Bld: 103 mg/dL — ABNORMAL HIGH (ref 70–99)
Potassium: 4.2 mmol/L (ref 3.5–5.1)
Sodium: 129 mmol/L — ABNORMAL LOW (ref 135–145)
Total Bilirubin: 0.6 mg/dL (ref 0.0–1.2)
Total Protein: 7.2 g/dL (ref 6.5–8.1)

## 2023-11-29 LAB — TSH: TSH: 1.682 u[IU]/mL (ref 0.350–4.500)

## 2023-11-29 MED ORDER — HEPARIN SOD (PORK) LOCK FLUSH 100 UNIT/ML IV SOLN
500.0000 [IU] | Freq: Once | INTRAVENOUS | Status: AC
  Administered 2023-11-29: 500 [IU]
  Filled 2023-11-29: qty 5

## 2023-11-29 NOTE — Progress Notes (Signed)
Patient had a CT scan on 11/26/2023. His appetite has dropped recently. His chest has started hurting a little worse, cough is about the same he just can't cough all of the flim out.

## 2023-11-30 ENCOUNTER — Telehealth: Payer: Self-pay | Admitting: *Deleted

## 2023-11-30 LAB — T4: T4, Total: 5.7 ug/dL (ref 4.5–12.0)

## 2023-11-30 NOTE — Telephone Encounter (Signed)
V/o from Dr. Alena Bills. entered order for pet scan Spoke with patient. He is agreeable to restaging pet ct

## 2023-11-30 NOTE — Telephone Encounter (Signed)
I called the patient and the date is 2/18 at 9:30 appt for scan, pt does have driver at 4:09. He does not eat or drink 6 hours before the scan , except he can drink water only. He asked if someone going to call me the day before and yes Lupita Leash in in pet scan calls youthe day before of the pet date

## 2023-12-03 ENCOUNTER — Encounter: Payer: Self-pay | Admitting: Internal Medicine

## 2023-12-03 NOTE — Progress Notes (Addendum)
 [facility] CONSULT NOTE  Patient Care Team: Gildardo Pounds, PA as PCP - General (Physician Assistant) Debbe Odea, MD as PCP - Cardiology (Cardiology) Salena Saner, MD as Consulting Physician (Pulmonary Disease) Glory Buff, RN as Oncology Nurse Navigator Michaelyn Barter, MD as Consulting Physician (Oncology)   CANCER STAGING   Cancer Staging  Stage IV adenocarcinoma of lung [facility]) Staging form: Lung, AJCC 8th Edition - Clinical: Stage IV (cT4, cN3, cM1) - Signed by Michaelyn Barter, MD on 05/20/2023 Stage prefix: Initial diagnosis   ASSESSMENT & PLAN:  Austin Patterson 68 y.o. 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  Pathology showed poorly differentiated carcinoma with spindle cell component.  Tumor cells positive for CK and focal TTF-1.  The differential diagnosis includes pleomorphic carcinoma and carcinosarcoma.  Definitive classification cannot be performed on the biopsy specimen.  -Tempus testing showed PD-L1 0% with no targetable mutations.  - Had systemic treatment with concern for involvement of upper abdominal lymph nodes and pulmonary metastasis.  s/p dose reduced carboplatin, Taxol and Keytruda on 06/01/2023 x 4 cycles.  Poor functional status/ poor tolerance with two hospitalizations for sepsis/ and second for weakness.  -CT imaging is unfortunately showing disease progression.  Increase in size  of right middle lobe lung mass from 4.8 x 3 cm to 5.2 x 4.9 cm.  Left lower lobe nodule previously 0.8 x 0.8 to 3.5 x 2.2 cm.  Posterior right lung base nodule 1.8 x 1.7.  Previously 1.9 x 1.1 cm.  Stable appearance of mediastinal and right hilar lymph nodes.  -Will hold Keytruda today.  Obtain PET scan.  Guardant360 testing for any new targetable mutations.  If the disease progression is only confined to the thoracic area, we will consider concurrent chemo RT with weekly CarboTaxol and then consolidate with 2 cycles of carboplatin and gemcitabine.  # Chronic respiratory failure on 2 L oxygen # Asthma and COPD overlap # Idiopathic pulmonary fibrosis -Management per pulmonary.  On Dupixent  # Hypocalcemia -Vitamin D level is normal -Continue with calcium vitamin D supplements.  # Hyponatremia -Chronic in nature.  Sodium 129 at baseline.  # Iron deficiency anemia -Completed IV Venofer x 5 doses.  Hemoglobin improved to 12.  Iron panel has normalized.  # Cancer related pain -In mid chest.  Continue with oxycodone 5 mg Q8 as needed.   #CAD- on aspirin, plavix  # IV access-port removed on 06/14/23 due to sepsis.   -Inserted on left side on 07/30/23.  Orders Placed This Encounter  Procedures   NM PET Image Restag (PS) Skull Base To Thigh    Standing Status:   Future    Expected Date:   12/07/2023    Expiration Date:   11/29/2024    If indicated for the ordered procedure, I authorize the administration of a radiopharmaceutical per Radiology protocol:   Yes    Preferred imaging location?:   [facility]   RTC TBD  The total time spent in the appointment was 30 minutes  encounter with patients including review of chart and various tests results, discussions about plan of care and coordination of care plan   All questions were answered. The patient knows to call the clinic with any problems, questions or concerns. No barriers to learning was detected.  Michaelyn Barter,  MD 2/14/20254:06 PM   HISTORY OF PRESENTING ILLNESS:  Austin Patterson 68 y.o. 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  Patient was seen today in the clinic for further evaluation.  He is a remote smoker quit in 2016.  Prior smoked 2 packs a day since age 24.  He is on 2 L oxygen since 2017.  Lives alone.  Was in a wheelchair.  Does not have family close by.  Follows with Dr. Jayme Cloud.   Interval history Patient seen today as follow-up for MD visit, labs, Keytruda. Patient having issues with breathing which has been affected by weather.  Chronic chest pain using oxycodone twice or 3 times a day.   I have reviewed his chart and materials related to his cancer extensively and collaborated history with the patient. Summary of oncologic history is as follows:  Oncology History  Stage IV adenocarcinoma of lung (HCC)  05/18/2023 Initial Diagnosis   Sarcomatoid carcinoma of lung (HCC)   05/20/2023 Cancer Staging   Staging form: Lung, AJCC 8th Edition - Clinical: Stage IV (cT4, cN3, cM1) - Signed by Michaelyn Barter, MD on 05/20/2023 Stage prefix: Initial diagnosis   06/01/2023 -  Chemotherapy   Patient is on Treatment Plan : LUNG NSCLC Carboplatin (6) + Paclitaxel (200) + Pembrolizumab (200) D1 q21d x 4 cycles / Pembrolizumab (200) Maintenance D1 q21d       MEDICAL HISTORY:  Past Medical History:  Diagnosis Date   Abdominal pain 12/07/2022   Acute urinary retention 12/07/2022   Chest pain 01/24/2022   COPD (chronic obstructive pulmonary disease) (HCC)    GERD (gastroesophageal reflux  disease)    Hemothorax on right 05/11/2023   Hyperlipidemia    Hypertension    Pleuritic pain 05/10/2023   Pneumothorax after biopsy 05/11/2023   Post procedure discomfort 05/13/2023   Pulmonary fibrosis (HCC) 11/2015    SURGICAL HISTORY: Past Surgical History:  Procedure Laterality Date   COLONOSCOPY     CORONARY STENT PLACEMENT     ESOPHAGOGASTRODUODENOSCOPY (EGD) WITH PROPOFOL N/A 09/23/2016   Procedure: ESOPHAGOGASTRODUODENOSCOPY (EGD) WITH PROPOFOL;  Surgeon: Wyline Mood, MD;  Location: [facility];  Service: Endoscopy;  Laterality: N/A;   IR IMAGING GUIDED PORT INSERTION  05/26/2023   IR IMAGING GUIDED PORT INSERTION  07/30/2023   IR RADIOLOGIST EVAL & MGMT  07/20/2023   KYPHOPLASTY N/A 03/14/2020   Procedure: T7 & T11 KYPHOPLASTY;  Surgeon: Kennedy Bucker, MD;  Location: [facility] ORS;  Service: Orthopedics;  Laterality: N/A;   PORT-A-CATH REMOVAL N/A 06/14/2023   Procedure: REMOVAL PORT-A-CATH;  Surgeon: Sung Amabile, DO;  Location: [facility] ORS;  Service: General;  Laterality: N/A;   SHOULDER ACROMIOPLASTY      SOCIAL HISTORY: Social History   Socioeconomic History   Marital status: Legally Separated    Spouse name: Not on file   Number of children: Not on file  Years of education: Not on file   Highest education level: Not on file  Occupational History   Not on file  Tobacco Use   Smoking status: Former    Current packs/day: 0.00    Average packs/day: 2.0 packs/day for 50.0 years (100.0 ttl pk-yrs)    Types: Cigarettes    Start date: 10/13/1965    Quit date: 10/14/2015    Years since quitting: 8.1   Smokeless tobacco: Former  Building services engineer status: Never Used  Substance and Sexual Activity   Alcohol use: Yes    Alcohol/week: 56.0 standard drinks of alcohol    Types: 56 Cans of beer per week    Comment: "I sit around and drink beer, that's all I got to do"   Drug use: No   Sexual activity: Not Currently  Other Topics Concern   Not on file  Social History  Narrative   Not on file   Social Drivers of Health   Financial Resource Strain: Not on file  Food Insecurity: No Food Insecurity (06/12/2023)   Hunger Vital Sign    Worried About Running Out of Food in the Last Year: Never true    Ran Out of Food in the Last Year: Never true  Transportation Needs: Unmet Transportation Needs (11/29/2023)   PRAPARE - Administrator, Civil Service (Medical): Yes    Lack of Transportation (Non-Medical): Yes  Physical Activity: Not on file  Stress: Not on file  Social Connections: Not on file  Intimate Partner Violence: Not At Risk (06/12/2023)   Humiliation, Afraid, Rape, and Kick questionnaire    Fear of Current or Ex-Partner: No    Emotionally Abused: No    Physically Abused: No    Sexually Abused: No    FAMILY HISTORY: Family History  Problem Relation Age of Onset   Heart disease Mother     ALLERGIES:  is allergic to amoxicillin and tizanidine.  MEDICATIONS:  Current Outpatient Medications  Medication Sig Dispense Refill   acetaminophen (TYLENOL) 500 MG tablet Take 1,000 mg by mouth every 6 (six) hours as needed for moderate pain or headache.     albuterol (VENTOLIN HFA) 108 (90 Base) MCG/ACT inhaler Inhale 2 puffs into the lungs every 6 (six) hours as needed for wheezing or shortness of breath. 8 g 11   amLODipine (NORVASC) 5 MG tablet Take 1 tablet (5 mg total) by mouth at bedtime. Skip the dose if systolic BP less than 130 mmHg 30 tablet 5   aspirin EC 81 MG tablet Take 1 tablet (81 mg total) by mouth daily. 90 tablet 3   atorvastatin (LIPITOR) 40 MG tablet Take 1 tablet (40 mg total) by mouth at bedtime. 90 tablet 3   CALCIUM 600/VITAMIN D 600-10 MG-MCG TABS Take 1 tablet by mouth 2 (two) times daily.     clopidogrel (PLAVIX) 75 MG tablet Take 1 tablet (75 mg total) by mouth daily. 90 tablet 3   Dupilumab (DUPIXENT) 300 MG/2ML SOPN Inject 300 mg into the skin every 14 (fourteen) days. 12 mL 1   EPINEPHrine 0.3 mg/0.3 mL IJ SOAJ  injection Inject 0.3 mg into the muscle as needed for anaphylaxis.     ferrous sulfate 325 (65 FE) MG tablet Take 1 tablet (325 mg total) by mouth 2 (two) times daily with a meal. 60 tablet 2   fluticasone-salmeterol (ADVAIR DISKUS) 250-50 MCG/ACT AEPB Inhale 1 puff into the lungs in the morning and at bedtime. 60 each 11  gabapentin (NEURONTIN) 300 MG capsule TAKE 1 CAPSULE BY MOUTH THREE TIMES A DAY 90 capsule 1   guaiFENesin (ROBITUSSIN) 100 MG/5ML liquid Take 5 mLs by mouth every 4 (four) hours as needed for cough or to loosen phlegm. 236 mL 1   guaiFENesin-dextromethorphan (ROBITUSSIN DM) 100-10 MG/5ML syrup Take 5 mLs by mouth every 4 (four) hours as needed for cough. 118 mL 0   ipratropium-albuterol (DUONEB) 0.5-2.5 (3) MG/3ML SOLN INHALE 1 VIAL THROUGH NEBULIZER EVERY 6 HOURS 270 mL 2   lidocaine-prilocaine (EMLA) cream Apply to affected area once 30 g 3   magnesium oxide (MAG-OX) 400 (241.3 Mg) MG tablet Take 1 tablet (400 mg total) by mouth daily. 30 tablet 0   metoprolol succinate (TOPROL-XL) 100 MG 24 hr tablet Take 1 tablet (100 mg total) by mouth daily. 30 tablet 11   montelukast (SINGULAIR) 10 MG tablet Take 10 mg by mouth daily.     Multiple Vitamin (MULTIVITAMIN WITH MINERALS) TABS tablet Take 1 tablet by mouth daily. 30 tablet 0   omega-3 acid ethyl esters (LOVAZA) 1 g capsule Take 1 g by mouth daily.     ondansetron (ZOFRAN) 8 MG tablet Take 1 tablet (8 mg total) by mouth every 8 (eight) hours as needed for nausea or vomiting. Start on the third day after carboplatin. 30 tablet 1   oxyCODONE (OXY IR/ROXICODONE) 5 MG immediate release tablet Take 1 tablet (5 mg total) by mouth every 8 (eight) hours as needed for severe pain (pain score 7-10). 90 tablet 0   pantoprazole (PROTONIX) 40 MG tablet Take 1 tablet (40 mg total) by mouth 2 (two) times daily. 90 tablet 3   predniSONE (DELTASONE) 10 MG tablet TAKE 1 TABLET (10 MG TOTAL) BY MOUTH DAILY AS NEEDED. TAKE AS DIRECTED. 30 tablet 1    prochlorperazine (COMPAZINE) 10 MG tablet Take 1 tablet (10 mg total) by mouth every 6 (six) hours as needed for nausea or vomiting. 30 tablet 1   senna-docusate (SENOKOT-S) 8.6-50 MG tablet Take 1 tablet by mouth 2 (two) times daily. 30 tablet 1   Spacer/Aero-Holding Chambers (AEROCHAMBER MV) inhaler Use as instructed 1 each 0   No current facility-administered medications for this visit.    REVIEW OF SYSTEMS:   Pertinent information mentioned in HPI All other systems were reviewed with the patient and are negative.  PHYSICAL EXAMINATION: ECOG PERFORMANCE STATUS: 3   Vitals:   11/29/23 1325  BP: (!) 144/64  Pulse: 81  Resp: 18  Temp: 98.8 F (37.1 C)  SpO2: 94%    Filed Weights   11/29/23 1325  Weight: 191 lb (86.6 kg)     GENERAL:alert, no distress and comfortable SKIN: skin color, texture, turgor are normal, no rashes or significant lesions EYES: normal, conjunctiva are pink and non-injected, sclera clear OROPHARYNX:no exudate, no erythema and lips, buccal mucosa, and tongue normal  NECK: supple, thyroid normal size, non-tender, without nodularity LYMPH:  no palpable lymphadenopathy in the cervical, axillary or inguinal LUNGS: clear to auscultation and percussion with normal breathing effort HEART: regular rate & rhythm and no murmurs and no lower extremity edema ABDOMEN:abdomen soft, non-tender and normal bowel sounds Musculoskeletal:no cyanosis of digits and no clubbing  PSYCH: alert & oriented x 3 with fluent speech NEURO: no focal motor/sensory deficits  LABORATORY DATA:  I have reviewed the data as listed Lab Results  Component Value Date   WBC 10.6 (H) 11/29/2023   HGB 12.0 (L) 11/29/2023   HCT 36.2 (L) 11/29/2023  MCV 97.1 11/29/2023   PLT 380 11/29/2023   Recent Labs    06/16/23 0824 06/17/23 0329 10/18/23 0909 11/08/23 1237 11/29/23 1307  NA 133*   < > 134* 132* 129*  K 3.5   < > 4.0 4.2 4.2  CL 98   < > 97* 96* 91*  CO2 28   < > 27 27  26   GLUCOSE 152*   < > 114* 115* 103*  BUN 10   < > 12 15 14   CREATININE 0.55*   < > 0.67 0.83 0.73  CALCIUM 7.0*   < > 9.1 8.9 8.8*  GFRNONAA >60   < > >60 >60 >60  PROT 6.5   < > 7.5 7.4 7.2  ALBUMIN 2.9*   < > 4.4 4.4 3.7  AST 18   < > 20 23 22   ALT 25   < > 17 18 17   ALKPHOS 87   < > 43 50 89  BILITOT 0.3   < > 0.5 0.5 0.6  BILIDIR <0.1  --   --   --   --   IBILI NOT CALCULATED  --   --   --   --    < > = values in this interval not displayed.    RADIOGRAPHIC STUDIES: I have personally reviewed the radiological images as listed and agreed with the findings in the report. CT CHEST ABDOMEN PELVIS W CONTRAST Result Date: 11/29/2023 CLINICAL DATA:  Restaging adenocarcinoma of the right lung. * Tracking Code: BO * EXAM: CT CHEST, ABDOMEN, AND PELVIS WITH CONTRAST TECHNIQUE: Multidetector CT imaging of the chest, abdomen and pelvis was performed following the standard protocol during bolus administration of intravenous contrast. RADIATION DOSE REDUCTION: This exam was performed according to the departmental dose-optimization program which includes automated exposure control, adjustment of the mA and/or kV according to patient size and/or use of iterative reconstruction technique. CONTRAST:  OMNIPAQUE IOHEXOL 300 MG/ML  SOLN COMPARISON:  CT chest 08/30/2023 and CT AP from 08/06/2023 FINDINGS: CT CHEST FINDINGS Cardiovascular: Heart size is normal. No pericardial effusion. Aortic atherosclerosis and coronary artery calcifications. Mediastinum/Nodes: Thyroid gland, trachea, and esophagus are normal. Previous right paratracheal lymph node measures 1.3 cm, image 23/2. Previously 1.1 cm. Subcarinal lymph node measures 1.1 cm, image 32/2. Previously this measured 1 cm. Right hilar lymph node measures 1 cm, image 32/2. Previously this measured the same. Lungs/Pleura: No pleural effusion. Severe emphysema with bullous changes. Right middle lobe lung mass measures 5.2 x 4.9 cm, image 100/4. Previously  4.8 x 3.0 cm. Left lower lobe nodule measures 3.5 x 2.2 cm, image 117/4. Previously 0.8 x 0.8 cm. Posterior right lung base nodule measures 1.8 x 1.7 cm, image 117/4. Previously 1.9 x 1.1 cm. Bandlike area of scarring within the left upper lobe appears decreased in thickness on today's study measuring 3 mm in thickness, image 24/4. Previously 1 cm. Musculoskeletal: Previous vertebral augmentation identified at T6 and T11. Unchanged in the interval. There are fracture deformities involving bilateral scapula, chronic. CT ABDOMEN PELVIS FINDINGS Hepatobiliary: No focal liver abnormality is seen. No gallstones, gallbladder wall thickening, or biliary dilatation. Pancreas: Unremarkable. No pancreatic ductal dilatation or surrounding inflammatory changes. Spleen: Normal in size without focal abnormality. Adrenals/Urinary Tract: Unchanged appearance of bilateral adrenal nodules, previously characterized as benign adenomas. No follow-up imaging recommended. Bilateral renal cortical scarring. No suspicious mass or hydronephrosis. Urinary bladder appears unremarkable. Stomach/Bowel: Stomach is within normal limits. Appendix appears normal. No evidence of bowel wall thickening,  distention, or inflammatory changes. Vascular/Lymphatic: Aortic atherosclerosis. Porta hepatics lymph node measures 1.4 cm, image 61/2. Unchanged from previous exam. No retroperitoneal or pelvic adenopathy. Reproductive: Prostate is unremarkable. Other: No ascites. No discrete fluid collections. Fat containing umbilical hernia. Musculoskeletal: No acute or suspicious osseous findings. Bilateral L5 pars defects. No significant listhesis. No aggressive lytic or sclerotic bone lesions identified. IMPRESSION: 1. Interval progression of disease. The dominant mass within the right middle lobe has increased in size in the interval. There is also been significant increase in size of left lower lobe metastasis. Stable to mild increase in size of metastasis to  the posterior right lower lobe. 2. Stable appearance of mediastinal and right hilar lymph nodes. 3. No signs of metastatic disease within the abdomen or pelvis. 4. Aortic Atherosclerosis (ICD10-I70.0) and Emphysema (ICD10-J43.9). Electronically Signed   By: Signa Kell M.D.   On: 11/29/2023 13:31

## 2023-12-07 ENCOUNTER — Ambulatory Visit: Payer: Medicare HMO

## 2023-12-07 ENCOUNTER — Inpatient Hospital Stay: Payer: Medicare HMO

## 2023-12-07 ENCOUNTER — Encounter: Payer: Self-pay | Admitting: Internal Medicine

## 2023-12-08 ENCOUNTER — Ambulatory Visit: Payer: Medicare HMO | Admitting: Pulmonary Disease

## 2023-12-08 ENCOUNTER — Encounter: Payer: Self-pay | Admitting: Pulmonary Disease

## 2023-12-08 VITALS — BP 124/80 | HR 87 | Temp 97.1°F | Ht 67.0 in | Wt 191.0 lb

## 2023-12-08 DIAGNOSIS — J9611 Chronic respiratory failure with hypoxia: Secondary | ICD-10-CM

## 2023-12-08 DIAGNOSIS — E8801 Alpha-1-antitrypsin deficiency: Secondary | ICD-10-CM

## 2023-12-08 DIAGNOSIS — C3491 Malignant neoplasm of unspecified part of right bronchus or lung: Secondary | ICD-10-CM

## 2023-12-08 DIAGNOSIS — J449 Chronic obstructive pulmonary disease, unspecified: Secondary | ICD-10-CM | POA: Diagnosis not present

## 2023-12-08 DIAGNOSIS — J4550 Severe persistent asthma, uncomplicated: Secondary | ICD-10-CM | POA: Diagnosis not present

## 2023-12-08 NOTE — Progress Notes (Signed)
Subjective:    Patient ID: Austin Patterson, male    DOB: 1956/09/11, 68 y.o.   MRN: [id number]  Patient Care Team: Gildardo Pounds, PA as PCP - General (Physician Assistant) Debbe Odea, MD as PCP - Cardiology (Cardiology) Salena Saner, MD as Consulting Physician (Pulmonary Disease) Glory Buff, RN as Oncology Nurse Navigator Michaelyn Barter, MD as Consulting Physician (Oncology)  Chief Complaint  Patient presents with   Follow-up    DOE. No wheezing. Cough, dry    BACKGROUND/INTERVAL: Austin Patterson is a very complex 68 year old former smoker (100 PY) with stage IV very severe COPD, severe persistent asthma and chronic respiratory failure with hypoxia who presents for follow-up of these issues.  I last saw him on 06 October 2023 and at that time he was well compensated from his respiratory issues.  At baseline the patient is very sedentary.  This is due to debilitating dyspnea.  He is compliant with oxygen at 2 L/min 24/7.  He has a complex respiratory history with very severe asthma with IgE initially recorded at 1,950 this was noted to be directed to Aspergillus fumigatus (ABPA).  He is also a heterozygous alpha-1 antitrypsin MZ phenotype with normal alpha-1 levels.  He has been on Dupixent for management of his asthma component.  Since his prior visit he has been diagnosed with further ration of disease and has been taken off Keytruda.    HPI Discussed the use of AI scribe software for clinical note transcription with the patient, who gave verbal consent to proceed.  History of Present Illness   Austin Patterson is a 68 year old male with COPD asthma overlap and chronic respiratory failure with hypoxia who presents for follow-up.  His lung cancer, which was previously responding to therapy, has shown new growth. A recent CT scan revealed new growth on the left side, while some of the previous growths on the right have disappeared, however the main tumor has significantly  increased in size.  His breathing is described as 'not very good', although there has been no recent wheezing. He has not required prednisone recently, indicating some stability in his respiratory symptoms. He continues to use Dupixent injections and does not need any medication refills at this time.    Review of Systems A 10 point review of systems was performed and it is as noted above otherwise negative.   Patient Active Problem List   Diagnosis Date Noted   Hypocalcemia 09/06/2023   Overweight (BMI 25.0-29.9) 08/08/2023   SIRS (systemic inflammatory response syndrome) (HCC) 08/06/2023   Encounter for monoclonal antibody treatment for malignancy 07/05/2023   Cancer related pain 07/05/2023   Paroxysmal atrial fibrillation (HCC) 06/16/2023   Sepsis (HCC) 06/12/2023   Iron deficiency anemia 06/08/2023   Stage IV adenocarcinoma of lung (HCC) 05/18/2023   Swelling of right foot 05/11/2023   Heterozygous alpha 1-antitrypsin deficiency (HCC) 04/27/2023   Cough 04/03/2023   Alcohol use 04/03/2023   Constipation 12/10/2022   COPD exacerbation (HCC) 02/23/2022   GERD without esophagitis 02/23/2022   Alpha-1-antitrypsin deficiency carrier 01/10/2021   History of nonmelanoma skin cancer 12/19/2020   Asthma 03/12/2020   Chronic respiratory failure with hypoxia (HCC) 03/12/2020   Hyperlipidemia    Asthma-COPD overlap syndrome (HCC)    Bleeding nose    Alcohol abuse    RUQ abdominal pain    Anaphylaxis 11/01/2019   Hypotension 11/01/2019   CAD (coronary artery disease) 11/01/2019   H/O heart artery stent 11/01/2019   Acute on  chronic respiratory failure with hypoxia (HCC)    RUQ pain    Essential hypertension    Pulmonary fibrosis (HCC) 08/30/2019   Hypomagnesemia 03/31/2017   Hiatal hernia    Reflux esophagitis    Hematemesis without nausea    Hyponatremia 09/21/2016   Multifocal pneumonia 12/09/2015    Social History   Tobacco Use   Smoking status: Former    Current  packs/day: 0.00    Average packs/day: 2.0 packs/day for 50.0 years (100.0 ttl pk-yrs)    Types: Cigarettes    Start date: 10/13/1965    Quit date: 10/14/2015    Years since quitting: 8.1   Smokeless tobacco: Former  Substance Use Topics   Alcohol use: Yes    Alcohol/week: 56.0 standard drinks of alcohol    Types: 56 Cans of beer per week    Comment: "I sit around and drink beer, that's all I got to do"    Allergies  Allergen Reactions   Amoxicillin Anaphylaxis   Tizanidine     Feet and ankle swell     Current Meds  Medication Sig   acetaminophen (TYLENOL) 500 MG tablet Take 1,000 mg by mouth every 6 (six) hours as needed for moderate pain or headache.   albuterol (VENTOLIN HFA) 108 (90 Base) MCG/ACT inhaler Inhale 2 puffs into the lungs every 6 (six) hours as needed for wheezing or shortness of breath.   amLODipine (NORVASC) 5 MG tablet Take 1 tablet (5 mg total) by mouth at bedtime. Skip the dose if systolic BP less than 130 mmHg   aspirin EC 81 MG tablet Take 1 tablet (81 mg total) by mouth daily.   atorvastatin (LIPITOR) 40 MG tablet Take 1 tablet (40 mg total) by mouth at bedtime.   CALCIUM 600/VITAMIN D 600-10 MG-MCG TABS Take 1 tablet by mouth 2 (two) times daily.   clopidogrel (PLAVIX) 75 MG tablet Take 1 tablet (75 mg total) by mouth daily.   Dupilumab (DUPIXENT) 300 MG/2ML SOPN Inject 300 mg into the skin every 14 (fourteen) days.   EPINEPHrine 0.3 mg/0.3 mL IJ SOAJ injection Inject 0.3 mg into the muscle as needed for anaphylaxis.   fluticasone-salmeterol (ADVAIR DISKUS) 250-50 MCG/ACT AEPB Inhale 1 puff into the lungs in the morning and at bedtime.   gabapentin (NEURONTIN) 300 MG capsule TAKE 1 CAPSULE BY MOUTH THREE TIMES A DAY   guaiFENesin (ROBITUSSIN) 100 MG/5ML liquid Take 5 mLs by mouth every 4 (four) hours as needed for cough or to loosen phlegm.   guaiFENesin-dextromethorphan (ROBITUSSIN DM) 100-10 MG/5ML syrup Take 5 mLs by mouth every 4 (four) hours as needed  for cough.   ipratropium-albuterol (DUONEB) 0.5-2.5 (3) MG/3ML SOLN INHALE 1 VIAL THROUGH NEBULIZER EVERY 6 HOURS   lidocaine-prilocaine (EMLA) cream Apply to affected area once   magnesium oxide (MAG-OX) 400 (241.3 Mg) MG tablet Take 1 tablet (400 mg total) by mouth daily.   metoprolol succinate (TOPROL-XL) 100 MG 24 hr tablet Take 1 tablet (100 mg total) by mouth daily.   montelukast (SINGULAIR) 10 MG tablet Take 10 mg by mouth daily.   Multiple Vitamin (MULTIVITAMIN WITH MINERALS) TABS tablet Take 1 tablet by mouth daily.   omega-3 acid ethyl esters (LOVAZA) 1 g capsule Take 1 g by mouth daily.   ondansetron (ZOFRAN) 8 MG tablet Take 1 tablet (8 mg total) by mouth every 8 (eight) hours as needed for nausea or vomiting. Start on the third day after carboplatin.   oxyCODONE (OXY IR/ROXICODONE) 5 MG immediate  release tablet Take 1 tablet (5 mg total) by mouth every 8 (eight) hours as needed for severe pain (pain score 7-10).   pantoprazole (PROTONIX) 40 MG tablet Take 1 tablet (40 mg total) by mouth 2 (two) times daily.   potassium chloride SA (KLOR-CON M) 20 MEQ tablet Take 20 mEq by mouth daily.   predniSONE (DELTASONE) 10 MG tablet TAKE 1 TABLET (10 MG TOTAL) BY MOUTH DAILY AS NEEDED. TAKE AS DIRECTED.   prochlorperazine (COMPAZINE) 10 MG tablet Take 1 tablet (10 mg total) by mouth every 6 (six) hours as needed for nausea or vomiting.   senna-docusate (SENOKOT-S) 8.6-50 MG tablet Take 1 tablet by mouth 2 (two) times daily.   Spacer/Aero-Holding Chambers (AEROCHAMBER MV) inhaler Use as instructed    Immunization History  Administered Date(s) Administered   Fluad Quad(high Dose 65+) 10/03/2023   Influenza Inj Mdck Quad Pf 08/10/2019   Influenza Inj Mdck Quad With Preservative 07/18/2018   Influenza,inj,Quad PF,6+ Mos 10/15/2015, 08/31/2016   Influenza-Unspecified 08/10/2019, 07/19/2020, 07/19/2022   Moderna SARS-COV2 Booster Vaccination 10/23/2020   Moderna Sars-Covid-2 Vaccination  11/21/2019, 12/21/2019   Pneumococcal Polysaccharide-23 10/15/2015        Objective:     BP 124/80 (BP Location: Right Arm, Cuff Size: Normal)   Pulse 87   Temp (!) 97.1 F (36.2 C)   Ht 5\' 7"  (1.702 m)   Wt 191 lb (86.6 kg) Comment: per the patient. in a wheelchair today  SpO2 96%   BMI 29.91 kg/m   SpO2: 96 % O2 Device: Nasal cannula O2 Flow Rate (L/min): 2 L/min O2 Type: Continuous O2  GENERAL: Chronically ill appearing gentleman, chronic use of accessories, presents in transport chair.  Wearing oxygen via nasal cannula.  No acute respiratory distress.  No plethora.  HEAD: Normocephalic, atraumatic. EYES: Pupils equal, round, reactive to light.  No scleral icterus. MOUTH: Teeth in poor repair, oral mucosa moist, no thrush. NECK: Supple. No thyromegaly. Trachea midline. No JVD.  No adenopathy. PULMONARY: Increased AP diameter, significant kyphosis. Distant breath sounds.  No adventitious sounds.   CARDIOVASCULAR: S1 and S2. Regular rate and rhythm.  Distant heart tones no murmur appreciated. GASTROINTESTINAL: Benign. MUSCULOSKELETAL: No joint deformity, no clubbing, no edema. NEUROLOGIC: No overt focal deficit.  Speech is fluent.  Awake, alert. SKIN: Intact,warm,dry. PSYCH: Mood and behavior normal.        Assessment & Plan:     ICD-10-CM   1. Stage 4 very severe COPD by GOLD classification (HCC)  J44.9     2. Severe persistent asthma without complication  J45.50     3. Chronic respiratory failure with hypoxia (HCC)  J96.11     4. Heterozygous alpha 1-antitrypsin deficiency (HCC)  E88.01     5. Adenocarcinoma of right lung, stage 4 (HCC)  C34.91      Discussion:    COPD-Asthma Overlap Chronic condition with ongoing respiratory symptoms. No recent exacerbations requiring prednisone. No wheezing on examination today. Continues on Dupilumab.  He is maintained on Advair and DuoNeb and is to continue the same. - Continue Dupilumab - Follow up in 2 months -  Report any worsening of breathing  Chronic Respiratory Failure with Hypoxia Chronic condition requiring ongoing management. No acute changes noted during this visit. 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 Previous right-sided lesions resolved, new left-sided lesion emerged. Awaiting biopsy for further treatment options. PET CT planned to identify additional biopsy sites. Discussed treatment challenges  and importance of PET CT despite potential delays. - Await PET/CT results ordered by oncology - Monitor biopsy scheduling updates  Follow-up - Follow up in 2 months.      Advised if symptoms do not improve or worsen, to please contact office for sooner follow up or seek emergency care.    I spent 32 minutes of dedicated to the care of this patient on the date of this encounter to include pre-visit review of records, face-to-face time with the patient discussing conditions above, post visit ordering of testing, clinical documentation with the electronic health record, making appropriate referrals as documented, and communicating necessary findings to members of the patients care team.     C. Danice Goltz, MD Advanced Bronchoscopy PCCM [facility]    *This note was generated using voice recognition software/Dragon and/or AI transcription program.  Despite best efforts to proofread, errors can occur which can change the meaning. Any transcriptional errors that result from this process are unintentional and may not be fully corrected at the time of dictation.

## 2023-12-08 NOTE — Patient Instructions (Addendum)
VISIT SUMMARY:  Today, we reviewed your chronic respiratory conditions and discussed the recent changes in your lung cancer. Your breathing remains stable without recent wheezing, and you have not needed prednisone. However, a recent CT scan showed new growth on the left side of your lungs, while previous growths on the right have disappeared. We are awaiting biopsy results and planning a PET CT to guide further treatment.  YOUR PLAN:  -COPD-ASTHMA OVERLAP: This is a chronic condition where symptoms of both COPD and asthma are present. You have not had any recent flare-ups requiring prednisone, and there was no wheezing during today's examination. Please continue using Dupilumab as prescribed and follow up in 2 months. Report any worsening of your breathing immediately.  -CHRONIC RESPIRATORY FAILURE WITH HYPOXIA: This is a long-term condition where your lungs cannot provide enough oxygen to your body. There were no acute changes noted today. We will continue to monitor your respiratory status closely.  -ADENOCARCINOMA WITH SARCOMATOID FEATURES: This is a type of lung cancer that has shown new growth on the left side of your lungs. We are awaiting biopsy results to determine the next steps in your treatment. A PET CT is planned to identify additional biopsy sites. It is important to proceed with the PET CT despite potential delays.  INSTRUCTIONS:  Please follow up in 2 months. Report any worsening of your breathing immediately.  Will follow-up on the PET/CT when this is completed.

## 2023-12-13 ENCOUNTER — Telehealth: Payer: Self-pay | Admitting: *Deleted

## 2023-12-13 NOTE — Telephone Encounter (Signed)
 DR A- Pet scan r/s to 2/25. Please advise on when to schedule pt's followup

## 2023-12-14 ENCOUNTER — Inpatient Hospital Stay: Payer: Medicare HMO

## 2023-12-14 ENCOUNTER — Encounter
Admission: RE | Admit: 2023-12-14 | Discharge: 2023-12-14 | Disposition: A | Discharge status: Home / Self Care | Payer: Medicare HMO | Source: Ambulatory Visit | Attending: Internal Medicine | Admitting: Internal Medicine

## 2023-12-14 ENCOUNTER — Encounter: Payer: Self-pay | Admitting: Internal Medicine

## 2023-12-14 DIAGNOSIS — C3491 Malignant neoplasm of unspecified part of right bronchus or lung: Secondary | ICD-10-CM | POA: Insufficient documentation

## 2023-12-14 LAB — GLUCOSE, CAPILLARY: Glucose-Capillary: 101 mg/dL — ABNORMAL HIGH (ref 70–99)

## 2023-12-14 MED ORDER — FLUDEOXYGLUCOSE F - 18 (FDG) INJECTION
10.4300 | Freq: Once | INTRAVENOUS | Status: AC | PRN
  Administered 2023-12-14: 10.43 via INTRAVENOUS

## 2023-12-17 ENCOUNTER — Other Ambulatory Visit: Payer: Self-pay

## 2023-12-17 DIAGNOSIS — C3491 Malignant neoplasm of unspecified part of right bronchus or lung: Secondary | ICD-10-CM

## 2023-12-20 NOTE — Telephone Encounter (Signed)
 Patient now sch for 3/12-apt with radiation consult

## 2023-12-22 ENCOUNTER — Other Ambulatory Visit: Payer: Self-pay | Admitting: Pulmonary Disease

## 2023-12-28 ENCOUNTER — Other Ambulatory Visit: Payer: Self-pay | Admitting: *Deleted

## 2023-12-28 DIAGNOSIS — C3491 Malignant neoplasm of unspecified part of right bronchus or lung: Secondary | ICD-10-CM

## 2023-12-28 MED ORDER — OXYCODONE HCL 5 MG PO TABS
5.0000 mg | ORAL_TABLET | Freq: Three times a day (TID) | ORAL | 0 refills | Status: DC | PRN
Start: 2023-12-28 — End: 2024-01-27

## 2023-12-29 ENCOUNTER — Emergency Department

## 2023-12-29 ENCOUNTER — Other Ambulatory Visit: Payer: Self-pay

## 2023-12-29 ENCOUNTER — Encounter: Payer: Self-pay | Admitting: Emergency Medicine

## 2023-12-29 ENCOUNTER — Ambulatory Visit: Admitting: Radiation Oncology

## 2023-12-29 ENCOUNTER — Inpatient Hospital Stay
Admission: EM | Admit: 2023-12-29 | Discharge: 2023-12-31 | DRG: 181 | Disposition: A | Discharge status: Home / Self Care | Source: Other Acute Inpatient Hospital | Attending: Student | Admitting: Student

## 2023-12-29 DIAGNOSIS — J441 Chronic obstructive pulmonary disease with (acute) exacerbation: Secondary | ICD-10-CM | POA: Diagnosis present

## 2023-12-29 DIAGNOSIS — Z955 Presence of coronary angioplasty implant and graft: Secondary | ICD-10-CM

## 2023-12-29 DIAGNOSIS — C342 Malignant neoplasm of middle lobe, bronchus or lung: Principal | ICD-10-CM | POA: Diagnosis present

## 2023-12-29 DIAGNOSIS — E871 Hypo-osmolality and hyponatremia: Secondary | ICD-10-CM | POA: Diagnosis present

## 2023-12-29 DIAGNOSIS — Z7902 Long term (current) use of antithrombotics/antiplatelets: Secondary | ICD-10-CM | POA: Diagnosis not present

## 2023-12-29 DIAGNOSIS — Z79899 Other long term (current) drug therapy: Secondary | ICD-10-CM

## 2023-12-29 DIAGNOSIS — Z9981 Dependence on supplemental oxygen: Secondary | ICD-10-CM | POA: Diagnosis not present

## 2023-12-29 DIAGNOSIS — Z66 Do not resuscitate: Secondary | ICD-10-CM | POA: Diagnosis not present

## 2023-12-29 DIAGNOSIS — Z789 Other specified health status: Secondary | ICD-10-CM | POA: Diagnosis present

## 2023-12-29 DIAGNOSIS — J439 Emphysema, unspecified: Secondary | ICD-10-CM | POA: Diagnosis present

## 2023-12-29 DIAGNOSIS — Z7982 Long term (current) use of aspirin: Secondary | ICD-10-CM | POA: Diagnosis not present

## 2023-12-29 DIAGNOSIS — I10 Essential (primary) hypertension: Secondary | ICD-10-CM | POA: Diagnosis present

## 2023-12-29 DIAGNOSIS — Z7951 Long term (current) use of inhaled steroids: Secondary | ICD-10-CM

## 2023-12-29 DIAGNOSIS — C3490 Malignant neoplasm of unspecified part of unspecified bronchus or lung: Secondary | ICD-10-CM | POA: Diagnosis not present

## 2023-12-29 DIAGNOSIS — G893 Neoplasm related pain (acute) (chronic): Secondary | ICD-10-CM | POA: Diagnosis present

## 2023-12-29 DIAGNOSIS — E785 Hyperlipidemia, unspecified: Secondary | ICD-10-CM | POA: Diagnosis present

## 2023-12-29 DIAGNOSIS — C3491 Malignant neoplasm of unspecified part of right bronchus or lung: Secondary | ICD-10-CM | POA: Diagnosis not present

## 2023-12-29 DIAGNOSIS — Z880 Allergy status to penicillin: Secondary | ICD-10-CM | POA: Diagnosis not present

## 2023-12-29 DIAGNOSIS — I2510 Atherosclerotic heart disease of native coronary artery without angina pectoris: Secondary | ICD-10-CM | POA: Diagnosis present

## 2023-12-29 DIAGNOSIS — Z888 Allergy status to other drugs, medicaments and biological substances status: Secondary | ICD-10-CM

## 2023-12-29 DIAGNOSIS — D6832 Hemorrhagic disorder due to extrinsic circulating anticoagulants: Secondary | ICD-10-CM | POA: Diagnosis present

## 2023-12-29 DIAGNOSIS — N400 Enlarged prostate without lower urinary tract symptoms: Secondary | ICD-10-CM | POA: Diagnosis present

## 2023-12-29 DIAGNOSIS — K2100 Gastro-esophageal reflux disease with esophagitis, without bleeding: Secondary | ICD-10-CM | POA: Diagnosis present

## 2023-12-29 DIAGNOSIS — J84112 Idiopathic pulmonary fibrosis: Secondary | ICD-10-CM | POA: Diagnosis present

## 2023-12-29 DIAGNOSIS — J9611 Chronic respiratory failure with hypoxia: Secondary | ICD-10-CM | POA: Diagnosis present

## 2023-12-29 DIAGNOSIS — Z87891 Personal history of nicotine dependence: Secondary | ICD-10-CM

## 2023-12-29 DIAGNOSIS — Z7952 Long term (current) use of systemic steroids: Secondary | ICD-10-CM

## 2023-12-29 DIAGNOSIS — K219 Gastro-esophageal reflux disease without esophagitis: Secondary | ICD-10-CM | POA: Diagnosis present

## 2023-12-29 DIAGNOSIS — Z8249 Family history of ischemic heart disease and other diseases of the circulatory system: Secondary | ICD-10-CM

## 2023-12-29 DIAGNOSIS — Z1152 Encounter for screening for COVID-19: Secondary | ICD-10-CM

## 2023-12-29 DIAGNOSIS — F1090 Alcohol use, unspecified, uncomplicated: Secondary | ICD-10-CM | POA: Diagnosis present

## 2023-12-29 DIAGNOSIS — R042 Hemoptysis: Principal | ICD-10-CM | POA: Diagnosis present

## 2023-12-29 DIAGNOSIS — D3500 Benign neoplasm of unspecified adrenal gland: Secondary | ICD-10-CM | POA: Diagnosis present

## 2023-12-29 LAB — BASIC METABOLIC PANEL
Anion gap: 9 (ref 5–15)
BUN: 14 mg/dL (ref 8–23)
CO2: 24 mmol/L (ref 22–32)
Calcium: 8.7 mg/dL — ABNORMAL LOW (ref 8.9–10.3)
Chloride: 98 mmol/L (ref 98–111)
Creatinine, Ser: 0.63 mg/dL (ref 0.61–1.24)
GFR, Estimated: >60 mL/min (ref >60)
Glucose, Bld: 93 mg/dL (ref 70–99)
Potassium: 3.6 mmol/L (ref 3.5–5.1)
Sodium: 131 mmol/L — ABNORMAL LOW (ref 135–145)

## 2023-12-29 LAB — CBC WITH DIFFERENTIAL/PLATELET
Abs Immature Granulocytes: 0.04 10*3/uL (ref 0.00–0.07)
Basophils Absolute: 0.1 10*3/uL (ref 0.0–0.1)
Basophils Relative: 1 %
Eosinophils Absolute: 0.4 10*3/uL (ref 0.0–0.5)
Eosinophils Relative: 5 %
HCT: 36.3 % — ABNORMAL LOW (ref 39.0–52.0)
Hemoglobin: 11.5 g/dL — ABNORMAL LOW (ref 13.0–17.0)
Immature Granulocytes: 1 %
Lymphocytes Relative: 20 %
Lymphs Abs: 1.6 10*3/uL (ref 0.7–4.0)
MCH: 31.9 pg (ref 26.0–34.0)
MCHC: 31.7 g/dL (ref 30.0–36.0)
MCV: 100.8 fL — ABNORMAL HIGH (ref 80.0–100.0)
Monocytes Absolute: 0.7 10*3/uL (ref 0.1–1.0)
Monocytes Relative: 9 %
Neutro Abs: 5.3 10*3/uL (ref 1.7–7.7)
Neutrophils Relative %: 64 %
Platelets: 286 10*3/uL (ref 150–400)
RBC: 3.6 MIL/uL — ABNORMAL LOW (ref 4.22–5.81)
RDW: 13.9 % (ref 11.5–15.5)
WBC: 8.1 10*3/uL (ref 4.0–10.5)
nRBC: 0 % (ref 0.0–0.2)

## 2023-12-29 LAB — PROTIME-INR
INR: 1 (ref 0.8–1.2)
Prothrombin Time: 13.6 s (ref 11.4–15.2)

## 2023-12-29 LAB — RESP PANEL BY RT-PCR (RSV, FLU A&B, COVID)  RVPGX2
Influenza A by PCR: NEGATIVE
Influenza B by PCR: NEGATIVE
Resp Syncytial Virus by PCR: NEGATIVE
SARS Coronavirus 2 by RT PCR: NEGATIVE

## 2023-12-29 MED ORDER — ALBUTEROL SULFATE (2.5 MG/3ML) 0.083% IN NEBU: 2.5000 mg | INHALATION_SOLUTION | RESPIRATORY_TRACT | Status: DC | PRN

## 2023-12-29 MED ORDER — IOHEXOL 350 MG/ML SOLN
75.0000 mL | Freq: Once | INTRAVENOUS | Status: AC | PRN
  Administered 2023-12-29: 75 mL via INTRAVENOUS

## 2023-12-29 MED ORDER — ACETAMINOPHEN 500 MG PO TABS
1000.0000 mg | ORAL_TABLET | Freq: Four times a day (QID) | ORAL | Status: DC | PRN
  Administered 2023-12-29 – 2023-12-31 (×3): 1000 mg via ORAL
  Filled 2023-12-29 (×4): qty 2

## 2023-12-29 MED ORDER — IPRATROPIUM-ALBUTEROL 0.5-2.5 (3) MG/3ML IN SOLN
3.0000 mL | Freq: Four times a day (QID) | RESPIRATORY_TRACT | Status: DC
  Administered 2023-12-29 – 2023-12-30 (×5): 3 mL via RESPIRATORY_TRACT
  Filled 2023-12-29 (×5): qty 3

## 2023-12-29 MED ORDER — SODIUM CHLORIDE 0.9 % IV SOLN: INTRAVENOUS | Status: AC

## 2023-12-29 MED ORDER — SODIUM CHLORIDE 0.9 % IV SOLN
500.0000 mg | INTRAVENOUS | Status: AC
  Administered 2023-12-29: 500 mg via INTRAVENOUS
  Filled 2023-12-29: qty 5

## 2023-12-29 MED ORDER — PREDNISONE 20 MG PO TABS
40.0000 mg | ORAL_TABLET | Freq: Every day | ORAL | Status: DC
  Administered 2023-12-30 – 2023-12-31 (×2): 40 mg via ORAL
  Filled 2023-12-29 (×2): qty 2

## 2023-12-29 MED ORDER — METHYLPREDNISOLONE SODIUM SUCC 125 MG IJ SOLR
125.0000 mg | Freq: Two times a day (BID) | INTRAMUSCULAR | Status: AC
  Administered 2023-12-29 (×2): 125 mg via INTRAVENOUS
  Filled 2023-12-29 (×2): qty 2

## 2023-12-29 MED ORDER — AZITHROMYCIN 500 MG PO TABS
500.0000 mg | ORAL_TABLET | Freq: Every day | ORAL | Status: AC
  Administered 2023-12-30 – 2023-12-31 (×2): 500 mg via ORAL
  Filled 2023-12-29 (×2): qty 1

## 2023-12-29 MED ORDER — OXYCODONE HCL 5 MG PO TABS
5.0000 mg | ORAL_TABLET | Freq: Three times a day (TID) | ORAL | Status: DC | PRN
  Administered 2023-12-29 – 2023-12-30 (×2): 5 mg via ORAL
  Filled 2023-12-29 (×2): qty 1

## 2023-12-29 MED ORDER — METOPROLOL SUCCINATE ER 50 MG PO TB24
100.0000 mg | ORAL_TABLET | Freq: Every day | ORAL | Status: DC
  Administered 2023-12-29 – 2023-12-31 (×3): 100 mg via ORAL
  Filled 2023-12-29 (×3): qty 2

## 2023-12-29 NOTE — ED Notes (Signed)
 This RN introduced self to pt.  Pt is alert and requesting medication for a headache.  According to the patient's MAR, pt was just given pain medication close to shift change so this RN will monitor and complete pain reassessment.

## 2023-12-29 NOTE — Assessment & Plan Note (Signed)
 Cont home oxycodone

## 2023-12-29 NOTE — Assessment & Plan Note (Signed)
 Baseline CAD s/p stenting several years ago  No active chest pain at present  Hold plavix for now in setting of hemoptysis

## 2023-12-29 NOTE — Evaluation (Signed)
 Occupational Therapy Evaluation Patient Details Name: Austin Patterson MRN: [id number] DOB: 07-Feb-1956 Today's Date: 12/29/2023   History of Present Illness   68 y.o. 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  Patient reports 1 episode of moderate to large hemoptysis earlier in the morning.  Patient reports coughing up a large amount of blood into the sink.  Denies any prior episodes like this in the past.  Noted to be followed by Dr. Michae Kava in the outpatient setting for stage IV lung cancer.  Has had multiple rounds of radiation as well as chemotherapy.     Clinical Impressions Patient presenting with decreased Ind in self care,balance, functional mobility/transfers, endurance, and safety awareness.  Patient reports being Ind and living at home alone. Pt reports use of rollator within home as needed. OT encouraged pt to continue using that at home for energy conservation.Pt is on 2Ls via [place] at baseline.  Patient currently functioning at supervision overall with mobility and toileting needs while pushing IV pole. Pt on 4Ls via [place] with mobility tasks. Patient will benefit from acute OT to increase overall independence in the areas of ADLs, functional mobility, and safety awareness in order to safely discharge.     If plan is discharge home, recommend the following:   A little help with walking and/or transfers;A little help with bathing/dressing/bathroom;Assistance with cooking/housework;Assist for transportation;Help with stairs or ramp for entrance     Functional Status Assessment   Patient has had a recent decline in their functional status and demonstrates the ability to make significant improvements in function in a reasonable and predictable amount of time.      Equipment Recommendations   None recommended by OT      Precautions/Restrictions   Precautions Precaution/Restrictions Comments: low fall risk     Mobility Bed Mobility Overal bed mobility: Modified Independent                  Transfers Overall transfer level: Needs assistance Equipment used: None Transfers: Sit to/from Stand Sit to Stand: Supervision                  Balance Overall balance assessment: Needs assistance Sitting-balance support: Feet supported Sitting balance-Leahy Scale: Good     Standing balance support: During functional activity, Single extremity supported Standing balance-Leahy Scale: Fair                             ADL either performed or assessed with clinical judgement   ADL Overall ADL's : Needs assistance/impaired     Grooming: Wash/dry hands;Standing;Supervision/safety                   Toilet Transfer: Supervision/safety   Toileting- Architect and Hygiene: Supervision/safety;Sit to/from stand       Functional mobility during ADLs: Supervision/safety       Vision Patient Visual Report: No change from baseline              Pertinent Vitals/Pain Pain Assessment Pain Assessment: No/denies pain     Extremity/Trunk Assessment Upper Extremity Assessment Upper Extremity Assessment: Overall WFL for tasks assessed   Lower Extremity Assessment Lower Extremity Assessment: Overall WFL for tasks assessed       Communication Communication Communication: No apparent difficulties   Cognition Arousal: Alert  Behavior During Therapy: WFL for tasks assessed/performed Cognition: No apparent impairments                               Following commands: Intact       Cueing  General Comments   Cueing Techniques: Verbal cues              Home Living Family/patient expects to be discharged to:: Private residence Living Arrangements: Alone Available Help at  Discharge: Family;Friend(s);Available PRN/intermittently Type of Home: House Home Access: Ramped entrance     Home Layout: Laundry or work area in basement;One level     Bathroom Shower/Tub: Chief Strategy Officer: Standard Bathroom Accessibility: No   Home Equipment: Cane - single Librarian, academic (2 wheels);Rollator (4 wheels);Shower seat          Prior Functioning/Environment Prior Level of Function : Independent/Modified Independent;Driving             Mobility Comments: indep without AD with constant 02 2L- reports no falls ADLs Comments: Ind with self care and has someone come once every 1-2wks to assist with laundry and cleaning of home. He uses rollator in home as needed and is on 2Ls via [place] at baseline.    OT Problem List: Decreased strength;Decreased activity tolerance;Decreased safety awareness;Impaired balance (sitting and/or standing);Decreased knowledge of use of DME or AE   OT Treatment/Interventions: Self-care/ADL training;Therapeutic exercise;Therapeutic activities;Energy conservation;DME and/or AE instruction;Patient/family education;Balance training      OT Goals(Current goals can be found in the care plan section)   Acute Rehab OT Goals Patient Stated Goal: to go home OT Goal Formulation: With patient Time For Goal Achievement: 01/12/24 Potential to Achieve Goals: Fair ADL Goals Pt Will Perform Grooming: with modified independence;standing Pt Will Perform Lower Body Dressing: with modified independence;sit to/from stand Pt Will Transfer to Toilet: with modified independence;ambulating Pt Will Perform Toileting - Clothing Manipulation and hygiene: with modified independence;sit to/from stand   OT Frequency:  Min 1X/week       AM-PAC OT "6 Clicks" Daily Activity     Outcome Measure Help from another person eating meals?: None Help from another person taking care of personal grooming?: None Help from another person toileting,  which includes using toliet, bedpan, or urinal?: A Little Help from another person bathing (including washing, rinsing, drying)?: A Little Help from another person to put on and taking off regular upper body clothing?: None Help from another person to put on and taking off regular lower body clothing?: A Little 6 Click Score: 21   End of Session Equipment Utilized During Treatment: Oxygen (4Ls) Nurse Communication: Mobility status  Activity Tolerance: Patient tolerated treatment well Patient left: in bed;with call bell/phone within reach  OT Visit Diagnosis: Unsteadiness on feet (R26.81);Muscle weakness (generalized) (M62.81)                Time: 1610-9604 OT Time Calculation (min): 19 min Charges:  OT General Charges $OT Visit: 1 Visit OT Evaluation $OT Eval Low Complexity: 1 Low OT Treatments $Self Care/Home Management : 8-22 mins  Jackquline Denmark, MS, OTR/L , CBIS ascom [phone]  12/29/23, 1:27 PM

## 2023-12-29 NOTE — ED Notes (Signed)
 Pt moved to room 37  report off to lisa rn cpod nurse

## 2023-12-29 NOTE — Assessment & Plan Note (Signed)
 PPI ?

## 2023-12-29 NOTE — H&P (Signed)
 History and Physical    Patient: Austin Patterson ZOX:[id number] DOB: 1956-05-16 DOA: 12/29/2023 DOS: the patient was seen and examined on 12/29/2023 PCP: Gildardo Pounds, PA  Patient coming from: Home  Chief Complaint:  Chief Complaint  Patient presents with   Hemoptysis   HPI: Austin Patterson is a 68 y.o. 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  Patient reports 1 episode of moderate to large hemoptysis earlier in the morning.  Patient reports coughing up a large amount of blood into the sink.  Denies any prior episodes like this in the past.  Noted to be followed by Dr. Michae Kava in the outpatient setting for stage IV lung cancer.  Has had multiple rounds of radiation as well as chemotherapy.  No longer smoking.  Denies any episodes of chest trauma.  No anticoagulation.  Noted to be on aspirin and Plavix in the setting of coronary disease.  No recent dosing changes.  No reported alcohol use.  No abdominal pain or diarrhea.  No fevers or chills.  Fairly stable on 2 L nasal cannula at home.  Positive mild wheezing as well as increased home inhaler use. Presented to the ER afebrile, hemodynamically stable.  Satting in the mid 90s on 2 to 3 L.  White count 8.1, hemoglobin 11.5, platelets 286, COVID flu and RSV negative.  Creatinine 0.63.  CTA chest negative for PE but does show interval increase in size of the right middle lobe and left lower lobe pulmonary masses.  Stable known adrenal adenoma. Review of Systems: As mentioned in the history of present illness. All other systems reviewed and are negative. Past Medical History:  Diagnosis Date   Abdominal pain 12/07/2022   Acute urinary retention 12/07/2022   Chest pain 01/24/2022   COPD (chronic obstructive pulmonary  disease) (HCC)    GERD (gastroesophageal reflux disease)    Hemothorax on right 05/11/2023   Hyperlipidemia    Hypertension    Pleuritic pain 05/10/2023   Pneumothorax after biopsy 05/11/2023   Post procedure discomfort 05/13/2023   Pulmonary fibrosis (HCC) 11/2015   Past Surgical History:  Procedure Laterality Date   COLONOSCOPY     CORONARY STENT PLACEMENT     ESOPHAGOGASTRODUODENOSCOPY (EGD) WITH PROPOFOL N/A 09/23/2016   Procedure: ESOPHAGOGASTRODUODENOSCOPY (EGD) WITH PROPOFOL;  Surgeon: Wyline Mood, MD;  Location: [facility];  Service: Endoscopy;  Laterality: N/A;   IR IMAGING GUIDED PORT INSERTION  05/26/2023   IR IMAGING GUIDED PORT INSERTION  07/30/2023   IR RADIOLOGIST EVAL & MGMT  07/20/2023   KYPHOPLASTY N/A 03/14/2020   Procedure: T7 & T11 KYPHOPLASTY;  Surgeon: Kennedy Bucker, MD;  Location: [facility] ORS;  Service: Orthopedics;  Laterality: N/A;   PORT-A-CATH REMOVAL N/A 06/14/2023   Procedure: REMOVAL PORT-A-CATH;  Surgeon: Sung Amabile, DO;  Location: [facility] ORS;  Service: General;  Laterality: N/A;   SHOULDER ACROMIOPLASTY     Social History:  reports that he quit smoking about 8 years ago. His smoking use included cigarettes. He started smoking about 58 years ago. He has a 100 pack-year smoking history. He has quit using smokeless tobacco. He reports current alcohol use of about 56.0 standard drinks of alcohol per week. He reports that he does not use drugs.  Allergies  Allergen Reactions   Amoxicillin Anaphylaxis   Tizanidine     Feet and ankle  swell     Family History  Problem Relation Age of Onset   Heart disease Mother     Prior to Admission medications   Medication Sig Start Date End Date Taking? Authorizing Provider  acetaminophen (TYLENOL) 500 MG tablet Take 1,000 mg by mouth every 6 (six) hours as needed for moderate pain or headache.    [provider]  albuterol (VENTOLIN HFA) 108 (90 Base) MCG/ACT inhaler Inhale 2 puffs into the lungs every 6  (six) hours as needed for wheezing or shortness of breath. 01/29/20   Danford, Earl Lites, MD  amLODipine (NORVASC) 5 MG tablet Take 1 tablet (5 mg total) by mouth at bedtime. Skip the dose if systolic BP less than 130 mmHg 06/22/23 12/19/23  Gillis Santa, MD  aspirin EC 81 MG tablet Take 1 tablet (81 mg total) by mouth daily. 08/19/16   Virl Axe, MD  atorvastatin (LIPITOR) 40 MG tablet Take 1 tablet (40 mg total) by mouth at bedtime. 08/19/16   Virl Axe, MD  CALCIUM 600/VITAMIN D 600-10 MG-MCG TABS Take 1 tablet by mouth 2 (two) times daily. 11/20/21   [provider]  clopidogrel (PLAVIX) 75 MG tablet Take 1 tablet (75 mg total) by mouth daily. 08/19/16   Virl Axe, MD  Dupilumab (DUPIXENT) 300 MG/2ML SOPN Inject 300 mg into the skin every 14 (fourteen) days. 11/04/22   Salena Saner, MD  EPINEPHrine 0.3 mg/0.3 mL IJ SOAJ injection Inject 0.3 mg into the muscle as needed for anaphylaxis.    [provider]  ferrous sulfate 325 (65 FE) MG tablet Take 1 tablet (325 mg total) by mouth 2 (two) times daily with a meal. 06/22/23 09/20/23  Gillis Santa, MD  fluticasone-salmeterol (ADVAIR DISKUS) 250-50 MCG/ACT AEPB Inhale 1 puff into the lungs in the morning and at bedtime. 03/04/22   Salena Saner, MD  gabapentin (NEURONTIN) 300 MG capsule TAKE 1 CAPSULE BY MOUTH THREE TIMES A DAY 07/07/23   Salena Saner, MD  guaiFENesin (ROBITUSSIN) 100 MG/5ML liquid Take 5 mLs by mouth every 4 (four) hours as needed for cough or to loosen phlegm. 07/05/23   Michaelyn Barter, MD  guaiFENesin-dextromethorphan (ROBITUSSIN DM) 100-10 MG/5ML syrup Take 5 mLs by mouth every 4 (four) hours as needed for cough. 06/01/23   Michaelyn Barter, MD  ipratropium-albuterol (DUONEB) 0.5-2.5 (3) MG/3ML SOLN INHALE 1 VIAL THROUGH NEBULIZER EVERY 6 HOURS 12/22/23   Salena Saner, MD  lidocaine-prilocaine (EMLA) cream Apply to affected area once 05/28/23   Michaelyn Barter, MD  magnesium oxide (MAG-OX)  400 (241.3 Mg) MG tablet Take 1 tablet (400 mg total) by mouth daily. 06/18/17   Milagros Loll, MD  metoprolol succinate (TOPROL-XL) 100 MG 24 hr tablet Take 1 tablet (100 mg total) by mouth daily. 06/22/23 06/21/24  Gillis Santa, MD  montelukast (SINGULAIR) 10 MG tablet Take 10 mg by mouth daily. 11/19/21   [provider]  Multiple Vitamin (MULTIVITAMIN WITH MINERALS) TABS tablet Take 1 tablet by mouth daily. 09/24/16   Enedina Finner, MD  omega-3 acid ethyl esters (LOVAZA) 1 g capsule Take 1 g by mouth daily.    [provider]  ondansetron (ZOFRAN) 8 MG tablet Take 1 tablet (8 mg total) by mouth every 8 (eight) hours as needed for nausea or vomiting. Start on the third day after carboplatin. 05/28/23   Michaelyn Barter, MD  oxyCODONE (OXY IR/ROXICODONE) 5 MG immediate release tablet Take 1 tablet (5 mg total) by mouth every  8 (eight) hours as needed for severe pain (pain score 7-10). 12/28/23   Michaelyn Barter, MD  pantoprazole (PROTONIX) 40 MG tablet Take 1 tablet (40 mg total) by mouth 2 (two) times daily. 09/23/16   Enedina Finner, MD  potassium chloride SA (KLOR-CON M) 20 MEQ tablet Take 20 mEq by mouth daily. 10/05/23   [provider]  predniSONE (DELTASONE) 10 MG tablet TAKE 1 TABLET (10 MG TOTAL) BY MOUTH DAILY AS NEEDED. TAKE AS DIRECTED. 11/15/23   Salena Saner, MD  prochlorperazine (COMPAZINE) 10 MG tablet Take 1 tablet (10 mg total) by mouth every 6 (six) hours as needed for nausea or vomiting. 05/28/23   Michaelyn Barter, MD  senna-docusate (SENOKOT-S) 8.6-50 MG tablet Take 1 tablet by mouth 2 (two) times daily. 12/09/22   Pennie Banter, DO  Spacer/Aero-Holding Chambers (AEROCHAMBER MV) inhaler Use as instructed 10/05/19   Salena Saner, MD    Physical Exam: Vitals:   12/29/23 0535 12/29/23 0600 12/29/23 0630 12/29/23 0728  BP: (!) 162/108 (!) 163/81 (!) 144/75 (!) 145/70  Pulse: 86 76 70 70  Resp:  20 (!) 22 20  Temp:      TempSrc:      SpO2: 93% 99% 100%    Weight:      Height:       Physical Exam Constitutional:      Appearance: He is normal weight.  HENT:     Head: Normocephalic and atraumatic.     Nose: Nose normal.     Mouth/Throat:     Mouth: Mucous membranes are moist.  Eyes:     Pupils: Pupils are equal, round, and reactive to light.  Cardiovascular:     Rate and Rhythm: Normal rate and regular rhythm.  Pulmonary:     Effort: Pulmonary effort is normal.     Breath sounds: Wheezing present.  Abdominal:     General: Bowel sounds are normal.  Musculoskeletal:        General: Normal range of motion.  Skin:    General: Skin is warm.  Neurological:     General: No focal deficit present.  Psychiatric:        Mood and Affect: Mood normal.     Data Reviewed:  There are no new results to review at this time.  Assessment and Plan: * Hemoptysis Chronic Resp failure on 2L  COPD Exacerbation  Idiopathic Pulmonary Fibrosis  1 episode of large hemoptysis in setting of baseline stage 4 lung cancer, pulmonary fibrosis and COPD CTA negative for PE-noted interval increased in lung masses on imaging  Stable on 2L [place] at present- chronic  Hgb stable at 11.5 Suspect mild COPD Exacerbation given diffuse wheezing, increased WOB and increased home inhaler use.  Will place on course of COPD Exacerbation treatment including solumedrol, duonebs, IV azithromycin  Dr. Jayme Cloud with pulmonology and Dr. Alena Bills with oncology made aware of case.  Check 2D ECHO Hold ASA and plavix for now  Palliative care consulted  CAD (coronary artery disease) Baseline CAD s/p stenting several years ago  No active chest pain at present  Hold plavix for now in setting of hemoptysis   Cancer related pain Cont home oxycodone   Stage IV adenocarcinoma of lung (HCC) Baseline stage 4 lung cancer- noted worsening lesion on CTA chest today  Followed by Dr. Alena Bills w/ oncology- request palliative care evaluation    Alcohol use No reported active use at  present    Essential hypertension BP stable  Titrate home regimen   Reflux esophagitis PPI      Advance Care Planning:   Code Status: Full Code   Consults: Palliative Care   Family Communication: No family at the bedside   Severity of Illness: The appropriate patient status for this patient is OBSERVATION. Observation status is judged to be reasonable and necessary in order to provide the required intensity of service to ensure the patient's safety. The patient's presenting symptoms, physical exam findings, and initial radiographic and laboratory data in the context of their medical condition is felt to place them at decreased risk for further clinical deterioration. Furthermore, it is anticipated that the patient will be medically stable for discharge from the hospital within 2 midnights of admission.   Author: Floydene Flock, MD 12/29/2023 9:07 AM  For on call review [email or web address].

## 2023-12-29 NOTE — ED Triage Notes (Signed)
 Pt to triage via w/c with no distress noted; hx lung CA (no active treatment currently); onset hemoptysis tonight; denies any other accomp symptoms; O2 at 2l/min via [place] at baseline

## 2023-12-29 NOTE — Assessment & Plan Note (Addendum)
 Chronic Resp failure on 2L  COPD Exacerbation  Idiopathic Pulmonary Fibrosis  1 episode of large hemoptysis in setting of baseline stage 4 lung cancer, pulmonary fibrosis and COPD CTA negative for PE-noted interval increased in lung masses on imaging  Stable on 2L [place] at present- chronic  Hgb stable at 11.5 Suspect mild COPD Exacerbation given diffuse wheezing, increased WOB and increased home inhaler use.  Will place on course of COPD Exacerbation treatment including solumedrol, duonebs, IV azithromycin  Dr. Jayme Cloud with pulmonology and Dr. Alena Bills with oncology made aware of case.  Check 2D ECHO Hold ASA and plavix for now  Palliative care consulted

## 2023-12-29 NOTE — Assessment & Plan Note (Signed)
 Baseline stage 4 lung cancer- noted worsening lesion on CTA chest today  Followed by Dr. Alena Bills w/ oncology- request palliative care evaluation

## 2023-12-29 NOTE — ED Provider Notes (Signed)
 [facility] Provider Note    Event Date/Time   First MD Initiated Contact with Patient 12/29/23 [phone]     (approximate)   History   Hemoptysis   HPI  Austin Patterson is a 68 y.o. male with history of COPD, hypertension, hyperlipidemia, pulmonary fibrosis, lung cancer on 2 L of oxygen chronically who presents to the emergency department with hemoptysis.  Patient states that he is coughing up quarter size clots of blood.  No increased shortness of breath, chest pain.  He is on Plavix.   History provided by patient, family.    Past Medical History:  Diagnosis Date   Abdominal pain 12/07/2022   Acute urinary retention 12/07/2022   Chest pain 01/24/2022   COPD (chronic obstructive pulmonary disease) (HCC)    GERD (gastroesophageal reflux disease)    Hemothorax on right 05/11/2023   Hyperlipidemia    Hypertension    Pleuritic pain 05/10/2023   Pneumothorax after biopsy 05/11/2023   Post procedure discomfort 05/13/2023   Pulmonary fibrosis (HCC) 11/2015    Past Surgical History:  Procedure Laterality Date   COLONOSCOPY     CORONARY STENT PLACEMENT     ESOPHAGOGASTRODUODENOSCOPY (EGD) WITH PROPOFOL N/A 09/23/2016   Procedure: ESOPHAGOGASTRODUODENOSCOPY (EGD) WITH PROPOFOL;  Surgeon: Wyline Mood, MD;  Location: [facility];  Service: Endoscopy;  Laterality: N/A;   IR IMAGING GUIDED PORT INSERTION  05/26/2023   IR IMAGING GUIDED PORT INSERTION  07/30/2023   IR RADIOLOGIST EVAL & MGMT  07/20/2023   KYPHOPLASTY N/A 03/14/2020   Procedure: T7 & T11 KYPHOPLASTY;  Surgeon: Kennedy Bucker, MD;  Location: [facility] OR[facility];  Service: Orthopedics;  Laterality: N/A;   PORT-A-CATH REMOVAL N/A 06/14/2023   Procedure: REMOVAL PORT-A-CATH;  Surgeon: Sung Amabile, DO;  Location: [facility] ORS;  Service: General;  Laterality: N/A;   SHOULDER ACROMIOPLASTY      MEDICATIONS:  Prior to Admission medications   Medication Sig Start Date End Date Taking? Authorizing Provider   acetaminophen (TYLENOL) 500 MG tablet Take 1,000 mg by mouth every 6 (six) hours as needed for moderate pain or headache.    [provider]  albuterol (VENTOLIN HFA) 108 (90 Base) MCG/ACT inhaler Inhale 2 puffs into the lungs every 6 (six) hours as needed for wheezing or shortness of breath. 01/29/20   Danford, Earl Lites, MD  amLODipine (NORVASC) 5 MG tablet Take 1 tablet (5 mg total) by mouth at bedtime. Skip the dose if systolic BP less than 130 mmHg 06/22/23 12/19/23  Gillis Santa, MD  aspirin EC 81 MG tablet Take 1 tablet (81 mg total) by mouth daily. 08/19/16   Virl Axe, MD  atorvastatin (LIPITOR) 40 MG tablet Take 1 tablet (40 mg total) by mouth at bedtime. 08/19/16   Virl Axe, MD  CALCIUM 600/VITAMIN D 600-10 MG-MCG TABS Take 1 tablet by mouth 2 (two) times daily. 11/20/21   [provider]  clopidogrel (PLAVIX) 75 MG tablet Take 1 tablet (75 mg total) by mouth daily. 08/19/16   Virl Axe, MD  Dupilumab (DUPIXENT) 300 MG/2ML SOPN Inject 300 mg into the skin every 14 (fourteen) days. 11/04/22   Salena Saner, MD  EPINEPHrine 0.3 mg/0.3 mL IJ SOAJ injection Inject 0.3 mg into the muscle as needed for anaphylaxis.    [provider]  ferrous sulfate 325 (65 FE) MG tablet Take 1 tablet (325 mg total) by mouth 2 (two) times daily with a meal. 06/22/23 09/20/23  Gillis Santa, MD  fluticasone-salmeterol (  ADVAIR DISKUS) 250-50 MCG/ACT AEPB Inhale 1 puff into the lungs in the morning and at bedtime. 03/04/22   Salena Saner, MD  gabapentin (NEURONTIN) 300 MG capsule TAKE 1 CAPSULE BY MOUTH THREE TIMES A DAY 07/07/23   Salena Saner, MD  guaiFENesin (ROBITUSSIN) 100 MG/5ML liquid Take 5 mLs by mouth every 4 (four) hours as needed for cough or to loosen phlegm. 07/05/23   Michaelyn Barter, MD  guaiFENesin-dextromethorphan (ROBITUSSIN DM) 100-10 MG/5ML syrup Take 5 mLs by mouth every 4 (four) hours as needed for cough. 06/01/23   Michaelyn Barter, MD   ipratropium-albuterol (DUONEB) 0.5-2.5 (3) MG/3ML SOLN INHALE 1 VIAL THROUGH NEBULIZER EVERY 6 HOURS 12/22/23   Salena Saner, MD  lidocaine-prilocaine (EMLA) cream Apply to affected area once 05/28/23   Michaelyn Barter, MD  magnesium oxide (MAG-OX) 400 (241.3 Mg) MG tablet Take 1 tablet (400 mg total) by mouth daily. 06/18/17   Milagros Loll, MD  metoprolol succinate (TOPROL-XL) 100 MG 24 hr tablet Take 1 tablet (100 mg total) by mouth daily. 06/22/23 06/21/24  Gillis Santa, MD  montelukast (SINGULAIR) 10 MG tablet Take 10 mg by mouth daily. 11/19/21   [provider]  Multiple Vitamin (MULTIVITAMIN WITH MINERALS) TABS tablet Take 1 tablet by mouth daily. 09/24/16   Enedina Finner, MD  omega-3 acid ethyl esters (LOVAZA) 1 g capsule Take 1 g by mouth daily.    [provider]  ondansetron (ZOFRAN) 8 MG tablet Take 1 tablet (8 mg total) by mouth every 8 (eight) hours as needed for nausea or vomiting. Start on the third day after carboplatin. 05/28/23   Michaelyn Barter, MD  oxyCODONE (OXY IR/ROXICODONE) 5 MG immediate release tablet Take 1 tablet (5 mg total) by mouth every 8 (eight) hours as needed for severe pain (pain score 7-10). 12/28/23   Michaelyn Barter, MD  pantoprazole (PROTONIX) 40 MG tablet Take 1 tablet (40 mg total) by mouth 2 (two) times daily. 09/23/16   Enedina Finner, MD  potassium chloride SA (KLOR-CON M) 20 MEQ tablet Take 20 mEq by mouth daily. 10/05/23   [provider]  predniSONE (DELTASONE) 10 MG tablet TAKE 1 TABLET (10 MG TOTAL) BY MOUTH DAILY AS NEEDED. TAKE AS DIRECTED. 11/15/23   Salena Saner, MD  prochlorperazine (COMPAZINE) 10 MG tablet Take 1 tablet (10 mg total) by mouth every 6 (six) hours as needed for nausea or vomiting. 05/28/23   Michaelyn Barter, MD  senna-docusate (SENOKOT-S) 8.6-50 MG tablet Take 1 tablet by mouth 2 (two) times daily. 12/09/22   Pennie Banter, DO  Spacer/Aero-Holding Chambers (AEROCHAMBER MV) inhaler Use as instructed 10/05/19    Salena Saner, MD    Physical Exam   Triage Vital Signs: ED Triage Vitals [12/29/23 0027]  Encounter Vitals Group     BP (!) 147/77     Systolic BP Percentile      Diastolic BP Percentile      Pulse Rate 72     Resp 20     Temp (!) 97.5 F (36.4 C)     Temp Source Oral     SpO2 96 %     Weight 180 lb (81.6 kg)     Height 5\' 7"  (1.702 m)     Head Circumference      Peak Flow      Pain Score 0     Pain Loc      Pain Education      Exclude from Growth Chart  Most recent vital signs: Vitals:   12/29/23 0630 12/29/23 0728  BP: (!) 144/75 (!) 145/70  Pulse: 70 70  Resp: (!) 22 20  Temp:    SpO2: 100%     CONSTITUTIONAL: Alert, responds appropriately to questions.  Elderly, in no distress HEAD: Normocephalic, atraumatic EYES: Conjunctivae clear, pupils appear equal, sclera nonicteric ENT: normal nose; moist mucous membranes NECK: Supple, normal ROM CARD: RRR; S1 and S2 appreciated RESP: Normal chest excursion without splinting or tachypnea; breath sounds clear and equal bilaterally; no wheezes, no rhonchi, no rales, no hypoxia on 2 L nasal cannula or respiratory distress, speaking full sentences ABD/GI: Non-distended; soft, non-tender, no rebound, no guarding, no peritoneal signs BACK: The back appears normal EXT: Normal ROM in all joints; no deformity noted, no edema, no calf tenderness or calf swelling SKIN: Normal color for age and race; warm; no rash on exposed skin NEURO: Moves all extremities equally, normal speech PSYCH: The patient's mood and manner are appropriate.   ED Results / Procedures / Treatments   LABS: (all labs ordered are listed, but only abnormal results are displayed) Labs Reviewed  CBC WITH DIFFERENTIAL/PLATELET - Abnormal; Notable for the following components:      Result Value   RBC 3.60 (*)    Hemoglobin 11.5 (*)    HCT 36.3 (*)    MCV 100.8 (*)    All other components within normal limits  BASIC METABOLIC PANEL - Abnormal;  Notable for the following components:   Sodium 131 (*)    Calcium 8.7 (*)    All other components within normal limits  RESP PANEL BY RT-PCR (RSV, FLU A&B, COVID)  RVPGX2  PROTIME-INR     EKG:   RADIOLOGY: My personal review and interpretation of imaging: CT scan redemonstrates lung cancer.  No PE.  I have personally reviewed all radiology reports.   CT Angio Chest PE W and/or Wo Contrast Result Date: 12/29/2023 CLINICAL DATA:  hemoptysis, hx lung CA EXAM: CT ANGIOGRAPHY CHEST WITH CONTRAST TECHNIQUE: Multidetector CT imaging of the chest was performed using the standard protocol during bolus administration of intravenous contrast. Multiplanar CT image reconstructions and MIPs were obtained to evaluate the vascular anatomy. RADIATION DOSE REDUCTION: This exam was performed according to the departmental dose-optimization program which includes automated exposure control, adjustment of the mA and/or kV according to patient size and/or use of iterative reconstruction technique. CONTRAST:  75mL OMNIPAQUE IOHEXOL 350 MG/ML SOLN COMPARISON:  CT chest 11/26/2023 FINDINGS: Cardiovascular: Left chest wall dialysis catheter with tip at the distal superior vena cava. Satisfactory opacification of the pulmonary arteries to the segmental level. No evidence of pulmonary embolism. Normal heart size. No significant pericardial effusion. The thoracic aorta is normal in caliber. Severe atherosclerotic plaque of the thoracic aorta. Four-vessel coronary artery calcifications. Mediastinum/Nodes: Prominent mediastinal lymph nodes. No enlarged mediastinal, hilar, or axillary lymph nodes. Thyroid gland, trachea, and esophagus demonstrate no significant findings. Lungs/Pleura: Severe paraseptal and centrilobular emphysematous changes. No focal consolidation. No pulmonary nodule. Interval increase in size of a lobulated right middle lobe pulmonary mass measuring 6.5 x 6.4 cm (from 4.8 x 5.2 cm). Interval increase in size of  the left lower lobe pulmonary nodule. Interval increase in size of right lower lobe pulmonary nodules. No pleural effusion. No pneumothorax. Upper Abdomen: Stable bilateral adrenal gland nodules. Musculoskeletal: No chest wall abnormality. No suspicious lytic or blastic osseous lesions. No acute displaced fracture. Kyphoplasty of the thoracic spine. Review of the MIP images confirms the above findings.  IMPRESSION: 1. No pulmonary embolus. 2. Interval increase in size of right middle lobe and left lower lobe pulmonary masses as well as right lower lobe pulmonary nodules in a patient with known lung cancer. 3. Stable bilateral adrenal gland nodules-consistent with known adrenal adenoma. 4.  Aortic Atherosclerosis (ICD10-I70.0). Electronically Signed   By: Tish Frederickson M.D.   On: 12/29/2023 02:42   DG Chest 2 View Result Date: 12/29/2023 CLINICAL DATA:  Cough, lung cancer, hemoptysis EXAM: CHEST - 2 VIEW COMPARISON:  CT 11/26/2023 FINDINGS: Lobular mass within the medial, basilar right middle lobe is again identified in keeping with the patient's known primary pulmonary malignancy. Large bleb again noted adjacent to the mass. No superimposed confluent pulmonary infiltrate. No pneumothorax or pleural effusion. Left internal jugular chest port tip seen expected superior vena cava. Cardiac size within normal limits. No acute bone abnormality. Mild artifact related to the patient's wheelchair IMPRESSION: 1. Right middle lobe mass in keeping with the patient's known primary pulmonary malignancy. No superimposed acute cardiopulmonary disease. Electronically Signed   By: Helyn Numbers M.D.   On: 12/29/2023 01:58     PROCEDURES:  Critical Care performed: Yes, see critical care procedure note(s)   CRITICAL CARE Performed by: Baxter Hire Lidiya Reise   Total critical care time: 30 minutes  Critical care time was exclusive of separately billable procedures and treating other patients.  Critical care was necessary to  treat or prevent imminent or life-threatening deterioration.  Critical care was time spent personally by me on the following activities: development of treatment plan with patient and/or surrogate as well as nursing, discussions with consultants, evaluation of patient's response to treatment, examination of patient, obtaining history from patient or surrogate, ordering and performing treatments and interventions, ordering and review of laboratory studies, ordering and review of radiographic studies, pulse oximetry and re-evaluation of patient's condition.   [name]1-3 Lead EKG Interpretation  Performed by: Whitnee Orzel, Layla Maw, DO Authorized by: Marcas Bowsher, Layla Maw, DO     Interpretation: normal     ECG rate:  70   ECG rate assessment: normal     Rhythm: sinus rhythm     Ectopy: none     Conduction: normal       IMPRESSION / MDM / ASSESSMENT AND PLAN / ED COURSE  I reviewed the triage vital signs and the nursing notes.    Patient here with hemoptysis.  He is on Plavix.  History of lung cancer.  Chronically on 2 L of oxygen.  The patient is on the cardiac monitor to evaluate for evidence of arrhythmia and/or significant heart rate changes.   DIFFERENTIAL DIAGNOSIS (includes but not limited to):   Worsening lung cancer, bleeding from antiplatelet use, anemia, coagulopathy, PE, pneumonia, bronchitis, alveolar hemorrhage   Patient's presentation is most consistent with acute presentation with potential threat to life or bodily function.   PLAN: Workup initiated from triage.  Hemoglobin of 11.5 which is stable from a month ago.  No leukocytosis.  Normal electrolytes, renal function, INR.  COVID, flu and RSV negative.  CTA of the chest reviewed and interpreted by myself and the radiologist and shows no PE, infiltrate, alveolar hemorrhage but he does have increased size of the right middle and left lower lobe masses compared to imaging on 11/26/2023.  Given he is on antiplatelets and has moderate  hemoptysis, have recommended admission.  Patient is agreeable to this plan.  Currently hemodynamically stable.   MEDICATIONS GIVEN IN ED: Medications  iohexol (OMNIPAQUE) 350 MG/ML injection 75  mL (75 mLs Intravenous Contrast Given 12/29/23 0123)     ED COURSE:  Consulted and discussed patient's case with hospitalist, Dr. Arville Care.  I have recommended admission and consulting physician agrees and will place admission orders.  Patient (and family if present) agree with this plan.   I reviewed all nursing notes, vitals, pertinent previous records.  All labs, EKGs, imaging ordered have been independently reviewed and interpreted by myself.      OUTSIDE RECORDS REVIEWED: Reviewed previous oncology notes.       FINAL CLINICAL IMPRESSION(S) / ED DIAGNOSES   Final diagnoses:  Hemoptysis     Rx / DC Orders   ED Discharge Orders     None        Note:  This document was prepared using Dragon voice recognition software and may include unintentional dictation errors.   Iniya Matzek, Layla Maw, DO 12/29/23 [phone]

## 2023-12-29 NOTE — Evaluation (Signed)
 Physical Therapy Evaluation Patient Details Name: Austin Patterson MRN: [id number] DOB: 03-06-1956 Today's Date: 12/29/2023  History of Present Illness  68 y.o. 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  Patient reports 1 episode of moderate to large hemoptysis earlier in the morning.  Patient reports coughing up a large amount of blood into the sink.  Denies any prior episodes like this in the past.  Noted to be followed by Dr. Michae Kava in the outpatient setting for stage IV lung cancer.  Has had multiple rounds of radiation as well as chemotherapy.  Clinical Impression  Pt did well with mobility and showed good confidence with getting to and maintaining static standing with good confidence and safety.  He does endorse feeling weaker both generally and with breathing (needing 5L O2 today and dropping <90 with minimal EOB activity).  While confident with static standing w/o UEs, however he struggled with modest dynamic challenges.  Pt will benefit from PT to address functional limitations.        If plan is discharge home, recommend the following: Assistance with cooking/housework;Assist for transportation;Help with stairs or ramp for entrance   Can travel by private vehicle        Equipment Recommendations None recommended by PT  Recommendations for Other Services       Functional Status Assessment Patient has had a recent decline in their functional status and demonstrates the ability to make significant improvements in function in a reasonable and predictable amount of time.     Precautions / Restrictions Precautions Precautions: Fall (low) Restrictions Weight Bearing Restrictions Per Provider Order: No      Mobility  Bed Mobility Overal bed mobility:  Independent             General bed mobility comments: easily able to get to sitting and back to supine w/o assist    Transfers Overall transfer level: Modified independent Equipment used: None Transfers: Sit to/from Stand Sit to Stand: Modified independent (Device/Increase time)           General transfer comment: Pt able to rise, wide BOS but no need for AD    Ambulation/Gait               General Gait Details: Pt has been out of room to bathroom multiple times today, did not feel like a prolonged bout of ambulation.  Does report that he has been using walker as he is quicker to fatigue with these ambulation effort.  Defers further out of room ambulation at this time.  (nursing does corroborate multiple ambulation trips out of the room today)  Stairs            Wheelchair Mobility     Tilt Bed    Modified Rankin (Stroke Patients Only)       Balance Overall balance assessment: Needs assistance Sitting-balance support: Feet supported Sitting balance-Leahy Scale: Good     Standing balance support: During functional activity, Single extremity supported Standing balance-Leahy Scale: Fair Standing balance comment: Pt able to maintain static standing balance well and do some basic side stepping/weight shifting.  Struggled with any marching and heel raises activity. Managed eyes close, wide BOS static standing, did not tolerate light perturbations.  Pertinent Vitals/Pain Pain Assessment Pain Assessment: No/denies pain    Home Living Family/patient expects to be discharged to:: Private residence Living Arrangements: Alone Available Help at Discharge: Family;Friend(s);Available PRN/intermittently Type of Home: House Home Access: Ramped entrance       Home Layout: Laundry or work area in basement;One level Home Equipment: Cane - single Librarian, academic (2 wheels);Rollator (4 wheels);Shower seat      Prior  Function Prior Level of Function : Independent/Modified Independent;Driving             Mobility Comments: indep without AD with constant 02 2L- reports no falls ADLs Comments: Ind with self care and has someone come once every 1-2wks to assist with laundry and cleaning of home. He uses rollator in home as needed and is on 2Ls via [place] at baseline.     Extremity/Trunk Assessment   Upper Extremity Assessment Upper Extremity Assessment: Overall WFL for tasks assessed    Lower Extremity Assessment Lower Extremity Assessment: Overall WFL for tasks assessed       Communication   Communication Communication: No apparent difficulties    Cognition Arousal: Alert Behavior During Therapy: WFL for tasks assessed/performed   PT - Cognitive impairments: No apparent impairments                         Following commands: Intact       Cueing       General Comments General comments (skin integrity, edema, etc.): Pt on 5L O2 (baseline 2L) with SpO2 in the mid 90s at rest and decreased to 89% with limited standing activity.  Pt does endorse much more fatigue/difficulty breathing than his baseline.    Exercises     Assessment/Plan    PT Assessment Patient needs continued PT services  PT Problem List Decreased activity tolerance;Decreased strength;Decreased balance;Cardiopulmonary status limiting activity       PT Treatment Interventions DME instruction;Stair training;Gait training;Functional mobility training;Therapeutic activities;Therapeutic exercise;Balance training;Patient/family education    PT Goals (Current goals can be found in the Care Plan section)       Frequency Min 1X/week     Co-evaluation               AM-PAC PT "6 Clicks" Mobility  Outcome Measure Help needed turning from your back to your side while in a flat bed without using bedrails?: None Help needed moving from lying on your back to sitting on the side of a flat bed without using  bedrails?: None Help needed moving to and from a bed to a chair (including a wheelchair)?: None Help needed standing up from a chair using your arms (e.g., wheelchair or bedside chair)?: A Little Help needed to walk in hospital room?: A Little Help needed climbing 3-5 steps with a railing? : A Little 6 Click Score: 21    End of Session   Activity Tolerance: Patient limited by fatigue Patient left: in bed;with call bell/phone within reach Nurse Communication: Mobility status PT Visit Diagnosis: Unsteadiness on feet (R26.81);Muscle weakness (generalized) (M62.81)    Time: 1610-9604 PT Time Calculation (min) (ACUTE ONLY): 20 min   Charges:   PT Evaluation $PT Eval Low Complexity: 1 Low   PT General Charges $$ ACUTE PT VISIT: 1 Visit         Malachi Pro, DPT 12/29/2023, 6:30 PM

## 2023-12-29 NOTE — Assessment & Plan Note (Signed)
 BP stable Titrate home regimen

## 2023-12-29 NOTE — Assessment & Plan Note (Addendum)
 No reported active use at present

## 2023-12-29 NOTE — Plan of Care (Signed)
   Problem: Education: Goal: Knowledge of General Education information will improve Description: Including pain rating scale, medication(s)/side effects and non-pharmacologic comfort measures Outcome: Progressing   Problem: Clinical Measurements: Goal: Will remain free from infection Outcome: Progressing

## 2023-12-30 ENCOUNTER — Other Ambulatory Visit: Payer: Self-pay | Admitting: Internal Medicine

## 2023-12-30 ENCOUNTER — Institutional Professional Consult (permissible substitution): Admitting: Radiation Oncology

## 2023-12-30 DIAGNOSIS — C3491 Malignant neoplasm of unspecified part of right bronchus or lung: Secondary | ICD-10-CM | POA: Diagnosis not present

## 2023-12-30 DIAGNOSIS — R042 Hemoptysis: Secondary | ICD-10-CM | POA: Diagnosis not present

## 2023-12-30 LAB — COMPREHENSIVE METABOLIC PANEL
ALT: 16 U/L (ref 0–44)
AST: 18 U/L (ref 15–41)
Albumin: 3.8 g/dL (ref 3.5–5.0)
Alkaline Phosphatase: 49 U/L (ref 38–126)
Anion gap: 9 (ref 5–15)
BUN: 14 mg/dL (ref 8–23)
CO2: 28 mmol/L (ref 22–32)
Calcium: 8.7 mg/dL — ABNORMAL LOW (ref 8.9–10.3)
Chloride: 97 mmol/L — ABNORMAL LOW (ref 98–111)
Creatinine, Ser: 0.58 mg/dL — ABNORMAL LOW (ref 0.61–1.24)
GFR, Estimated: >60 mL/min (ref >60)
Glucose, Bld: 173 mg/dL — ABNORMAL HIGH (ref 70–99)
Potassium: 4.1 mmol/L (ref 3.5–5.1)
Sodium: 134 mmol/L — ABNORMAL LOW (ref 135–145)
Total Bilirubin: 0.6 mg/dL (ref 0.0–1.2)
Total Protein: 6.8 g/dL (ref 6.5–8.1)

## 2023-12-30 LAB — CBC
HCT: 35.2 % — ABNORMAL LOW (ref 39.0–52.0)
Hemoglobin: 11.3 g/dL — ABNORMAL LOW (ref 13.0–17.0)
MCH: 31.8 pg (ref 26.0–34.0)
MCHC: 32.1 g/dL (ref 30.0–36.0)
MCV: 99.2 fL (ref 80.0–100.0)
Platelets: 286 10*3/uL (ref 150–400)
RBC: 3.55 MIL/uL — ABNORMAL LOW (ref 4.22–5.81)
RDW: 13.8 % (ref 11.5–15.5)
WBC: 5.7 10*3/uL (ref 4.0–10.5)
nRBC: 0 % (ref 0.0–0.2)

## 2023-12-30 LAB — HIV ANTIBODY (ROUTINE TESTING W REFLEX): HIV Screen 4th Generation wRfx: NONREACTIVE

## 2023-12-30 MED ORDER — LORATADINE 10 MG PO TABS: 10.0000 mg | ORAL_TABLET | Freq: Every day | ORAL | Status: DC | PRN

## 2023-12-30 MED ORDER — PANTOPRAZOLE SODIUM 40 MG PO TBEC
40.0000 mg | DELAYED_RELEASE_TABLET | Freq: Every day | ORAL | Status: DC
  Administered 2023-12-30 – 2023-12-31 (×2): 40 mg via ORAL
  Filled 2023-12-30 (×2): qty 1

## 2023-12-30 MED ORDER — ADULT MULTIVITAMIN W/MINERALS CH
1.0000 | ORAL_TABLET | Freq: Every day | ORAL | Status: DC
  Administered 2023-12-30 – 2023-12-31 (×2): 1 via ORAL
  Filled 2023-12-30 (×2): qty 1

## 2023-12-30 MED ORDER — ENSURE ENLIVE PO LIQD
237.0000 mL | Freq: Two times a day (BID) | ORAL | Status: DC
  Administered 2023-12-30 (×2): 237 mL via ORAL

## 2023-12-30 MED ORDER — IPRATROPIUM-ALBUTEROL 0.5-2.5 (3) MG/3ML IN SOLN
3.0000 mL | Freq: Three times a day (TID) | RESPIRATORY_TRACT | Status: DC
  Administered 2023-12-30 – 2023-12-31 (×3): 3 mL via RESPIRATORY_TRACT
  Filled 2023-12-30 (×3): qty 3

## 2023-12-30 MED ORDER — HYDROCOD POLI-CHLORPHE POLI ER 10-8 MG/5ML PO SUER: 5.0000 mL | Freq: Two times a day (BID) | ORAL | Status: DC | PRN

## 2023-12-30 MED ORDER — GUAIFENESIN ER 600 MG PO TB12
600.0000 mg | ORAL_TABLET | Freq: Two times a day (BID) | ORAL | Status: DC
  Administered 2023-12-30 – 2023-12-31 (×3): 600 mg via ORAL
  Filled 2023-12-30 (×3): qty 1

## 2023-12-30 NOTE — Consult Note (Signed)
 NEW PATIENT EVALUATION  Name: Austin Patterson  MRN: [id number]  Date:   12/29/2023     DOB: 06-27-1956   This 68 y.o. male patient presents to the clinic for initial evaluation of progressive stage IV adenocarcinoma lung with hemoptysis and growing masses in his chest and mediastinum.  REFERRING PHYSICIAN: No ref. provider found  CHIEF COMPLAINT:  Chief Complaint  Patient presents with   Hemoptysis    DIAGNOSIS: The encounter diagnosis was Hemoptysis.   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  He has multiple comorbidities including hypertension CAD COPD chronic respiratory failure on 2 L of nasal cannula idiopathic pulmonary fibrosis history of hypersensitivity pneumonitis.  He has multiple lung masses and is undergone biopsy by Dr. Jayme Cloud showing poorly differentiated carcinoma with spindle cell component.  He had Temp[employer] testing show PDL 0% with no target mutations he has had systemic treatment with Taxol Keytruda with poor tolerance.  He has had hospitalizations x 2 for sepsis.  He is recently mated for hemoptysis.  CT scans have shown increase in size of right middle lobe lung mass up to 5.2 cm in greatest dimension.  There is a left lower lobe nodule previously 0.8 cm now 3.5 cm.  He has also on PET scan shows disease progression as evidenced by hypermetabolic masses in bilateral lungs as well as mediastinal and hilar hypermetabolic activity.  He is seen today for consideration for palliative radiation therapy to his chest.  He was seen bedside he is stable at this time.  He has been on blood thinners although the aspirin discontinued while he is in the hospital he has really had no episodes of hemoptysis since admitted.  He has no significant cough at this  time.  PLANNED TREATMENT REGIMEN: Palliative radiation therapy to chest  PAST MEDICAL HISTORY:  has a past medical history of Abdominal pain (12/07/2022), Acute urinary retention (12/07/2022), Chest pain (01/24/2022), COPD (chronic obstructive pulmonary disease) (HCC), GERD (gastroesophageal reflux disease), Hemothorax on right (05/11/2023), Hyperlipidemia, Hypertension, Pleuritic pain (05/10/2023), Pneumothorax after biopsy (05/11/2023), Post procedure discomfort (05/13/2023), and Pulmonary fibrosis (HCC) (11/2015).    PAST SURGICAL HISTORY:  Past Surgical History:  Procedure Laterality Date   COLONOSCOPY     CORONARY STENT PLACEMENT     ESOPHAGOGASTRODUODENOSCOPY (EGD) WITH PROPOFOL N/A 09/23/2016   Procedure: ESOPHAGOGASTRODUODENOSCOPY (EGD) WITH PROPOFOL;  Surgeon: Wyline Mood, MD;  Location: [facility];  Service: Endoscopy;  Laterality: N/A;   IR IMAGING GUIDED PORT INSERTION  05/26/2023   IR IMAGING GUIDED PORT INSERTION  07/30/2023   IR RADIOLOGIST EVAL & MGMT  07/20/2023   KYPHOPLASTY N/A 03/14/2020   Procedure: T7 & T11 KYPHOPLASTY;  Surgeon: Kennedy Bucker, MD;  Location: [facility] ORS;  Service: Orthopedics;  Laterality: N/A;   PORT-A-CATH REMOVAL N/A 06/14/2023   Procedure: REMOVAL PORT-A-CATH;  Surgeon: Sung Amabile, DO;  Location: [facility] ORS;  Service: General;  Laterality: N/A;   SHOULDER ACROMIOPLASTY      FAMILY HISTORY: family history includes Heart disease in his mother.  SOCIAL HISTORY:  reports that he quit smoking about 8 years ago. His smoking use included cigarettes. He started smoking about 58 years ago. He has a 100 pack-year smoking history. He has quit using smokeless tobacco. He reports current alcohol use of about 56.0 standard drinks of alcohol per  week. He reports that he does not use drugs.  ALLERGIES: Amoxicillin and Tizanidine  MEDICATIONS:  Current Facility-Administered Medications  Medication Dose Route Frequency Provider Last Rate Last Admin   acetaminophen  (TYLENOL) tablet 1,000 mg  1,000 mg Oral Q6H PRN Manuela Schwartz, NP   1,000 mg at 12/30/23 0533   albuterol (PROVENTIL) (2.5 MG/3ML) 0.083% nebulizer solution 2.5 mg  2.5 mg Nebulization Q2H PRN Floydene Flock, MD       azithromycin [facility]) tablet 500 mg  500 mg Oral Daily Floydene Flock, MD   500 mg at 12/30/23 2952   chlorpheniramine-HYDROcodone (TUSSIONEX) 10-8 MG/5ML suspension 5 mL  5 mL Oral Q12H PRN Gillis Santa, MD       feeding supplement (ENSURE ENLIVE / ENSURE PLUS) liquid 237 mL  237 mL Oral BID BM Gillis Santa, MD       guaiFENesin (MUCINEX) 12 hr tablet 600 mg  600 mg Oral BID Gillis Santa, MD   600 mg at 12/30/23 8413   ipratropium-albuterol (DUONEB) 0.5-2.5 (3) MG/3ML nebulizer solution 3 mL  3 mL Nebulization TID Gillis Santa, MD       metoprolol succinate (TOPROL-XL) 24 hr tablet 100 mg  100 mg Oral Daily Floydene Flock, MD   100 mg at 12/30/23 2440   multivitamin with minerals tablet 1 tablet  1 tablet Oral Daily Gillis Santa, MD       oxyCODONE (Oxy IR/ROXICODONE) immediate release tablet 5 mg  5 mg Oral Q8H PRN Floydene Flock, MD   5 mg at 12/29/23 1851   pantoprazole (PROTONIX) EC tablet 40 mg  40 mg Oral Daily Gillis Santa, MD   40 mg at 12/30/23 0813   predniSONE (DELTASONE) tablet 40 mg  40 mg Oral Q breakfast Floydene Flock, MD   40 mg at 12/30/23 0813    ECOG PERFORMANCE STATUS:  1 - Symptomatic but completely ambulatory  REVIEW OF SYSTEMS: Patient denies any weight loss, fatigue, weakness, fever, chills or night sweats. Patient denies any loss of vision, blurred vision. Patient denies any ringing  of the ears or hearing loss. No irregular heartbeat. Patient denies heart murmur or history of fainting. Patient denies any chest pain or pain radiating to her upper extremities. Patient denies any shortness of breath, difficulty breathing at night, cough or hemoptysis. Patient denies any swelling in the lower legs. Patient denies any nausea vomiting,  vomiting of blood, or coffee ground material in the vomitus. Patient denies any stomach pain. Patient states has had normal bowel movements no significant constipation or diarrhea. Patient denies any dysuria, hematuria or significant nocturia. Patient denies any problems walking, swelling in the joints or loss of balance. Patient denies any skin changes, loss of hair or loss of weight. Patient denies any excessive worrying or anxiety or significant depression. Patient denies any problems with insomnia. Patient denies excessive thirst, polyuria, polydipsia. Patient denies any swollen glands, patient denies easy bruising or easy bleeding. Patient denies any recent infections, allergies or URI. Patient "s visual fields have not changed significantly in recent time.   PHYSICAL EXAM: BP (!) 150/55 (BP Location: Left Arm)   Pulse 71   Temp 97.9 F (36.6 C)   Resp 18   Ht 5\' 7"  (1.702 m)   Wt 180 lb (81.6 kg)   SpO2 94%   BMI 28.19 kg/m  Well-developed well-nourished patient in NAD. HEENT reveals PERLA, EOMI, discs not visualized.  Oral cavity is clear. No oral mucosal lesions are identified.  Neck is clear without evidence of cervical or supraclavicular adenopathy. Lungs are clear to A&P. Cardiac examination is essentially unremarkable with regular rate and rhythm without murmur rub or thrill. Abdomen is benign with no organomegaly or masses noted. Motor sensory and DTR levels are equal and symmetric in the upper and lower extremities. Cranial nerves II through XII are grossly intact. Proprioception is intact. No peripheral adenopathy or edema is identified. No motor or sensory levels are noted. Crude visual fields are within normal range.  LABORATORY DATA: Pathology reports reviewed    RADIOLOGY RESULTS: CT scans PET CT scans all reviewed and compatible with above-stated findings   IMPRESSION: Progressive poorly differentiated adenocarcinoma of the lung with spindle cell component progressing and  causing hemoptysis and increased size of lung masses in 68 year old male  PLAN: At this time to prevent further atelectasis and hemoptysis have offered palliative radiation therapy to his chest.  Would plan on delivering 40 Gray in 20 fractions using 3-dimensional treatment planning.  I will target the right lung masses as well as his mediastinal and hilar hypermetabolic activity using a PET/CT fusion study to target as much viable tumor as possible.  Risks and benefits of treatment including possible dysphagia from radiation esophagitis fatigue alteration blood counts cough and skin reaction all were reviewed with the patient.  I have set up for simulation tomorrow we will start his treatment to soon as possible early next week.  Patient comprehends my recommendations well.  I would like to take this opportunity to thank you for allowing me to participate in the care of your patient.Carmina Miller, MD

## 2023-12-30 NOTE — Progress Notes (Signed)
 [facility] Progress Note  Patient: Austin Patterson    NWG:[id number]  DOA: 12/29/2023     Date of Service: the patient was seen and examined on 12/30/2023  Chief Complaint  Patient presents with   Hemoptysis   Brief hospital course: Austin Patterson is a 68 y.o. 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  Patient reports 1 episode of moderate to large hemoptysis earlier in the morning.  Patient reports coughing up a large amount of blood into the sink.  Denies any prior episodes like this in the past.  Noted to be followed by Dr. Michae Kava in the outpatient setting for stage IV lung cancer.  Has had multiple rounds of radiation as well as chemotherapy.  No longer smoking.  Denies any episodes of chest trauma.  No anticoagulation.  Noted to be on aspirin and Plavix in the setting of coronary disease.  No recent dosing changes.  No reported alcohol use.  No abdominal pain or diarrhea.  No fevers or chills.  Fairly stable on 2 L nasal cannula at home.  Positive mild wheezing as well as increased home inhaler use. Presented to the ER afebrile, hemodynamically stable.  Satting in the mid 90s on 2 to 3 L.  White count 8.1, hemoglobin 11.5, platelets 286, COVID flu and RSV negative.  Creatinine 0.63.  CTA chest negative for PE but does show interval increase in size of the right middle lobe and left lower lobe pulmonary masses.  Stable known adrenal adenoma.   Assessment and Plan:  # Hemoptysis # Chronic Resp failure on 2L  # COPD Exacerbation  # Idiopathic Pulmonary Fibrosis  1 episode of large hemoptysis in setting of baseline stage 4 lung cancer, pulmonary fibrosis and COPD CTA negative for PE-noted interval increased in lung masses on imaging  Stable on 2L [place] at present-  chronic  Hgb stable at 11.5--11.3 Suspect mild COPD Exacerbation given diffuse wheezing, increased WOB and increased home inhaler use.  Continue course of COPD Exacerbation treatment including solumedrol, duonebs, IV azithromycin  Dr. Jayme Cloud with pulmonology and Dr. Alena Bills with oncology made aware of case.  Check 2D ECHO Hold ASA and plavix for now  Palliative care consulted 3/13 Patient was seen by radiation oncologist, recommended, set up for simulation tomorrow we will start his treatment to soon as possible early next week.  Seen by medical oncologist, recommended weekly carboplatin AUC 2 and Taxol 45 mg/m along with radiation for 6 weeks. Then consolidate with 2 cycles of carboplatin and gemcitabine. schedule for cycle of chemotherapy next Thursday as outpatient.   # Stage IV adenocarcinoma of lung  Baseline stage 4 lung cancer- noted worsening lesion on CTA chest  Followed by Dr. Alena Bills w/ oncology- request palliative care evaluation   # CAD (coronary artery disease) Baseline CAD s/p stenting several years ago  No active chest pain at present  Hold plavix for now in setting of hemoptysis    # Cancer related pain Cont home oxycodone      # Alcohol use No reported active use at present    # Essential hypertension BP stable  Titrate home regimen    # Reflux esophagitis: PPI    Body mass index is 28.19 kg/m.  Nutrition Problem: Increased nutrient needs Etiology: chronic illness (COPD) Interventions: Interventions: Ensure Enlive (each supplement provides 350kcal and  20 grams of protein), MVI, Liberalize Diet  Diet: Regular  DVT Prophylaxis: SCD, pharmacological prophylaxis contraindicated due to bleeding    Advance goals of care discussion: Limited code  Family Communication: family was not present at bedside, at the time of interview.  The pt provided permission to discuss medical plan with the family. Opportunity was given to ask question and all questions were  answered satisfactorily.   Disposition:  Pt is from Home, admitted with hemoptysis, seen by radiation oncologist, scheduled for radiotherapy tomorrow a.m.,  which precludes a safe discharge. Discharge to home, when stable and cleared by oncologist.  Subjective: No significant events overnight, patient feels improvement in the breathing, no hemoptysis since admission.  Denied any chest pain or palpitations, no nasal complaints.  Physical Exam: General: NAD, lying comfortably Appear in no distress, affect appropriate Eyes: PERRLA ENT: Oral Mucosa Clear, moist  Neck: no JVD,  Cardiovascular: S1 and S2 Present, no Murmur,  Respiratory: good respiratory effort, Bilateral Air entry equal and Decreased, no Crackles, no wheezes Abdomen: Bowel Sound present, Soft and no tenderness,  Skin: no rashes Extremities: no Pedal edema, no calf tenderness Neurologic: without any new focal findings Gait not checked due to patient safety concerns  Vitals:   12/30/23 0254 12/30/23 0400 12/30/23 0724 12/30/23 0815  BP:  (!) 158/72 (!) 150/55   Pulse:  79 77 71  Resp:  20 17 18   Temp:  98 F (36.7 C) 97.9 F (36.6 C)   TempSrc:  Oral    SpO2: 91% 94% 94% 94%  Weight:      Height:        Intake/Output Summary (Last 24 hours) at 12/30/2023 1406 Last data filed at 12/30/2023 4132 Gross per 24 hour  Intake 240 ml  Output 700 ml  Net -460 ml   Filed Weights   12/29/23 0027  Weight: 81.6 kg    Data Reviewed: I have personally reviewed and interpreted daily labs, tele strips, imagings as discussed above. I reviewed all nursing notes, pharmacy notes, vitals, pertinent old records I have discussed plan of care as described above with RN and patient/family.  CBC: Recent Labs  Lab 12/29/23 0026 12/30/23 0511  WBC 8.1 5.7  NEUTROABS 5.3  --   HGB 11.5* 11.3*  HCT 36.3* 35.2*  MCV 100.8* 99.2  PLT 286 286   Basic Metabolic Panel: Recent Labs  Lab 12/29/23 0026 12/30/23 0511  NA 131*  134*  K 3.6 4.1  CL 98 97*  CO2 24 28  GLUCOSE 93 173*  BUN 14 14  CREATININE 0.63 0.58*  CALCIUM 8.7* 8.7*    Studies: No results found.  Scheduled Meds:  azithromycin  500 mg Oral Daily   feeding supplement  237 mL Oral BID BM   guaiFENesin  600 mg Oral BID   ipratropium-albuterol  3 mL Nebulization TID   metoprolol succinate  100 mg Oral Daily   multivitamin with minerals  1 tablet Oral Daily   pantoprazole  40 mg Oral Daily   predniSONE  40 mg Oral Q breakfast   Continuous Infusions: PRN Meds: acetaminophen, albuterol, chlorpheniramine-HYDROcodone, oxyCODONE  Time spent: 35 minutes  Author: Gillis Santa. MD [facility] 12/30/2023 2:06 PM  To reach On-call, see care teams to locate the attending and reach out to them via [email or web address]. If 7PM-7AM, please contact night-coverage If you still have difficulty reaching the attending provider, please page the [facility] (Director on Call) for [facility] on amion for assistance.

## 2023-12-30 NOTE — Progress Notes (Signed)
 Mobility Specialist - Progress Note     12/30/23 1659  Mobility  Activity Ambulated with assistance in hallway;Stood at bedside  Level of Assistance Standby assist, set-up cues, supervision of patient - no hands on  Assistive Device Front wheel walker  Distance Ambulated (ft) 320 ft  Range of Motion/Exercises Active  Activity Response Tolerated well  Mobility Referral Yes  Mobility visit 1 Mobility  Mobility Specialist Start Time (ACUTE ONLY) 1634  Mobility Specialist Stop Time (ACUTE ONLY) 1653  Mobility Specialist Time Calculation (min) (ACUTE ONLY) 19 min   Pt resting EOB upon entry on 3.5L. Pt STS and ambulates to hallway around NS SBA  with RW for two laps. Pt took x1 standing rest break after 270 ft SpO2 (89) on 4L. Pt returned to EOB and left with needs in reach and bed alarm activated.   Johnathan Hausen Mobility Specialist 12/30/23, 5:08 PM

## 2023-12-30 NOTE — Plan of Care (Signed)
  Problem: Education: Goal: Knowledge of General Education information will improve Description: Including pain rating scale, medication(s)/side effects and non-pharmacologic comfort measures Outcome: Progressing   Problem: Health Behavior/Discharge Planning: Goal: Ability to manage health-related needs will improve Outcome: Progressing   Problem: Clinical Measurements: Goal: Ability to maintain clinical measurements within normal limits will improve Outcome: Progressing Goal: Respiratory complications will improve Outcome: Progressing   Problem: Activity: Goal: Risk for activity intolerance will decrease Outcome: Progressing   Problem: Nutrition: Goal: Adequate nutrition will be maintained Outcome: Progressing   

## 2023-12-30 NOTE — Consult Note (Signed)
 [facility]  [phone] [phone]  ID: Austin Patterson OB: [birth year]  MR#: [id number]  AOZ#:[id number]  Patient Care Team: Gildardo Pounds, PA as PCP - General (Physician Assistant) Debbe Odea, MD as PCP - Cardiology (Cardiology) Salena Saner, MD as Consulting Physician (Pulmonary Disease) Glory Buff, RN as Oncology Nurse Navigator Michaelyn Barter, MD as Consulting Physician (Oncology) Carmina Miller, MD as Consulting Physician (Radiation Oncology)  REFERRING PROVIDER: Dr. Alvester Morin  REASON FOR REFERRAL: lung cancer  ASSESSMENT AND PLAN:   Austin Patterson is a 68 y.o. 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  His disease was under control for about 8 to 9 months.  On 11/28/2023, restaging scan showed increased in the size of right middle lobe and left lower lobe pulmonary nodule.  PET scan from 12/14/2023 showed overall disease progression in lung masses and hypermetabolic metastatic mediastinal/hilar and periportal lymph node which are overall stable.  On most recent CTA chest from 12/29/2023, again the lung masses continue to increase in size.    -Patient prefers to continue with full treatment.  He was evaluated by Dr. Rushie Chestnut who plans to start radiation soon.  Scheduled for CT  simulation tomorrow.  I discussed about adding concurrent weekly carboplatin AUC 2 and Taxol 45 mg/m along with radiation for 6 weeks.  Then consolidate with 2 cycles of carboplatin and gemcitabine.  Overall, he has poor functional status, chronic hypoxic respiratory failure on 2 L oxygen and overall frailty.  Also, his cancer has been growing quickly just over the course of 4 weeks.  Patient understands that.  I will schedule for cycle of chemotherapy next Thursday as outpatient.  # COPD exacerbation -On IV Solu-Medrol, DuoNebs, azithromycin.  Management per primary team.  # Hemoptysis -Could be related to lung mass -No further episode since yesterday.  Management of antiplatelets per primary team.  Will arrange for outpatient follow-up.  Thank you for the referral.  Patient expressed understanding and was in agreement with this plan. He also understands that He can call clinic at any time with any questions, concerns, or complaints.   I spent a total of 60 minutes reviewing chart data, face-to-face evaluation with the patient, counseling and coordination of care as detailed above.   HPI: Austin Patterson is a 68 y.o. 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  Patient reported 1 episode of hemoptysis in the morning.  He is on aspirin and Plavix.  Denies any further episodes since.  He does report worsening shortness of breath and wheezing.  CTA chest showed severe emphysema increase in the size of right middle lobe  lung mass from 4.8 x 5.2 cm to 6.5 x 6.4 cm.   Interval increase in the size of left lower lobe pulmonary nodule and right lower lobe nodules.  Progressed quickly just over the course of 4 weeks.  He is also being managed for COPD exacerbation with DuoNebs, azithromycin and Solu-Medrol.  Aspirin and Plavix on hold now.  Echo is pending.  Patient was seen by Dr. Rushie Chestnut, radiation oncology.  He is planned for CT simulation tomorrow.   REVIEW OF SYSTEMS:   ROS  As per HPI. Otherwise, a complete review of systems is negative.  PAST MEDICAL HISTORY: Past Medical History:  Diagnosis Date   Abdominal pain 12/07/2022   Acute urinary retention 12/07/2022   Chest pain 01/24/2022   COPD (chronic obstructive pulmonary disease) (HCC)    GERD (gastroesophageal reflux disease)    Hemothorax on right 05/11/2023   Hyperlipidemia    Hypertension    Pleuritic pain 05/10/2023   Pneumothorax after biopsy 05/11/2023   Post procedure discomfort 05/13/2023   Pulmonary fibrosis (HCC) 11/2015    PAST SURGICAL HISTORY: Past Surgical History:  Procedure Laterality Date   COLONOSCOPY     CORONARY STENT PLACEMENT     ESOPHAGOGASTRODUODENOSCOPY (EGD) WITH PROPOFOL N/A 09/23/2016   Procedure: ESOPHAGOGASTRODUODENOSCOPY (EGD) WITH PROPOFOL;  Surgeon: Wyline Mood, MD;  Location: [facility] ENDOSCOPY;  Service: Endoscopy;  Laterality: N/A;   IR IMAGING GUIDED PORT INSERTION  05/26/2023   IR IMAGING GUIDED PORT INSERTION  07/30/2023   IR RADIOLOGIST EVAL & MGMT  07/20/2023   KYPHOPLASTY N/A 03/14/2020   Procedure: T7 & T11 KYPHOPLASTY;  Surgeon: Kennedy Bucker, MD;  Location: [facility] ORS;  Service: Orthopedics;  Laterality: N/A;   PORT-A-CATH REMOVAL N/A 06/14/2023   Procedure: REMOVAL PORT-A-CATH;  Surgeon: Sung Amabile, DO;  Location: [facility] ORS;  Service: General;  Laterality: N/A;   SHOULDER ACROMIOPLASTY      FAMILY HISTORY: Family History  Problem Relation Age of Onset   Heart disease Mother     HEALTH MAINTENANCE: Social History   Tobacco Use   Smoking  status: Former    Current packs/day: 0.00    Average packs/day: 2.0 packs/day for 50.0 years (100.0 ttl pk-yrs)    Types: Cigarettes    Start date: 10/13/1965    Quit date: 10/14/2015    Years since quitting: 8.2   Smokeless tobacco: Former  Building services engineer status: Never Used  Substance Use Topics   Alcohol use: Yes    Alcohol/week: 56.0 standard drinks of alcohol    Types: 56 Cans of beer per week    Comment: "I sit around and drink beer, that's all I got to do"   Drug use: No     Allergies  Allergen Reactions   Amoxicillin Anaphylaxis   Tizanidine     Feet and ankle swell     Current Facility-Administered Medications  Medication Dose Route Frequency Provider Last Rate Last Admin   acetaminophen (TYLENOL) tablet 1,000 mg  1,000 mg Oral Q6H PRN Manuela Schwartz, NP   1,000 mg at 12/30/23 0533   albuterol (PROVENTIL) (2.5 MG/3ML) 0.083% nebulizer solution 2.5 mg  2.5 mg Nebulization Q2H PRN Floydene Flock, MD       azithromycin [facility]) tablet 500 mg  500 mg Oral Daily Floydene Flock, MD   500 mg at 12/30/23 2725   chlorpheniramine-HYDROcodone (TUSSIONEX) 10-8 MG/5ML suspension 5 mL  5 mL Oral Q12H PRN Gillis Santa, MD       feeding  supplement (ENSURE ENLIVE / ENSURE PLUS) liquid 237 mL  237 mL Oral BID BM Gillis Santa, MD   237 mL at 12/30/23 1217   guaiFENesin (MUCINEX) 12 hr tablet 600 mg  600 mg Oral BID Gillis Santa, MD   600 mg at 12/30/23 6213   ipratropium-albuterol (DUONEB) 0.5-2.5 (3) MG/3ML nebulizer solution 3 mL  3 mL Nebulization TID Gillis Santa, MD       metoprolol succinate (TOPROL-XL) 24 hr tablet 100 mg  100 mg Oral Daily Floydene Flock, MD   100 mg at 12/30/23 0865   multivitamin with minerals tablet 1 tablet  1 tablet Oral Daily Gillis Santa, MD   1 tablet at 12/30/23 1217   oxyCODONE (Oxy IR/ROXICODONE) immediate release tablet 5 mg  5 mg Oral Q8H PRN Floydene Flock, MD   5 mg at 12/29/23 1851   pantoprazole (PROTONIX) EC tablet 40 mg  40  mg Oral Daily Gillis Santa, MD   40 mg at 12/30/23 0813   predniSONE (DELTASONE) tablet 40 mg  40 mg Oral Q breakfast Floydene Flock, MD   40 mg at 12/30/23 0813    OBJECTIVE: Vitals:   12/30/23 0724 12/30/23 0815  BP: (!) 150/55   Pulse: 77 71  Resp: 17 18  Temp: 97.9 F (36.6 C)   SpO2: 94% 94%     Body mass index is 28.19 kg/m.      General: Well-developed, well-nourished, no acute distress. Eyes: Pink conjunctiva, anicteric sclera. HEENT: Normocephalic, moist mucous membranes, clear oropharnyx. Lungs: Clear to auscultation bilaterally. Heart: Regular rate and rhythm. No rubs, murmurs, or gallops. Abdomen: Soft, nontender, nondistended. No organomegaly noted, normoactive bowel sounds. Musculoskeletal: No edema, cyanosis, or clubbing. Neuro: Alert, answering all questions appropriately. Cranial nerves grossly intact. Skin: No rashes or petechiae noted. Psych: Normal affect. Lymphatics: No cervical, calvicular, axillary or inguinal LAD.   LAB RESULTS:  Lab Results  Component Value Date   NA 134 (L) 12/30/2023   K 4.1 12/30/2023   CL 97 (L) 12/30/2023   CO2 28 12/30/2023   GLUCOSE 173 (H) 12/30/2023   BUN 14 12/30/2023   CREATININE 0.58 (L) 12/30/2023   CALCIUM 8.7 (L) 12/30/2023   PROT 6.8 12/30/2023   ALBUMIN 3.8 12/30/2023   AST 18 12/30/2023   ALT 16 12/30/2023   ALKPHOS 49 12/30/2023   BILITOT 0.6 12/30/2023   GFRNONAA >60 12/30/2023   GFRAA >60 03/24/2020    Lab Results  Component Value Date   WBC 5.7 12/30/2023   NEUTROABS 5.3 12/29/2023   HGB 11.3 (L) 12/30/2023   HCT 35.2 (L) 12/30/2023   MCV 99.2 12/30/2023   PLT 286 12/30/2023    Lab Results  Component Value Date   TIBC 409 10/18/2023   TIBC 475 (H) 06/08/2023   FERRITIN 122 10/18/2023   FERRITIN 147 06/08/2023   IRONPCTSAT 16 (L) 10/18/2023   IRONPCTSAT 6 (L) 06/08/2023     STUDIES: CT Angio Chest PE W and/or Wo Contrast Result Date: 12/29/2023 CLINICAL DATA:  hemoptysis, hx  lung CA EXAM: CT ANGIOGRAPHY CHEST WITH CONTRAST TECHNIQUE: Multidetector CT imaging of the chest was performed using the standard protocol during bolus administration of intravenous contrast. Multiplanar CT image reconstructions and MIPs were obtained to evaluate the vascular anatomy. RADIATION DOSE REDUCTION: This exam was performed according to the departmental dose-optimization program which includes automated exposure control, adjustment of the mA and/or kV according to patient size and/or use of iterative reconstruction technique. CONTRAST:  75mL  OMNIPAQUE IOHEXOL 350 MG/ML SOLN COMPARISON:  CT chest 11/26/2023 FINDINGS: Cardiovascular: Left chest wall dialysis catheter with tip at the distal superior vena cava. Satisfactory opacification of the pulmonary arteries to the segmental level. No evidence of pulmonary embolism. Normal heart size. No significant pericardial effusion. The thoracic aorta is normal in caliber. Severe atherosclerotic plaque of the thoracic aorta. Four-vessel coronary artery calcifications. Mediastinum/Nodes: Prominent mediastinal lymph nodes. No enlarged mediastinal, hilar, or axillary lymph nodes. Thyroid gland, trachea, and esophagus demonstrate no significant findings. Lungs/Pleura: Severe paraseptal and centrilobular emphysematous changes. No focal consolidation. No pulmonary nodule. Interval increase in size of a lobulated right middle lobe pulmonary mass measuring 6.5 x 6.4 cm (from 4.8 x 5.2 cm). Interval increase in size of the left lower lobe pulmonary nodule. Interval increase in size of right lower lobe pulmonary nodules. No pleural effusion. No pneumothorax. Upper Abdomen: Stable bilateral adrenal gland nodules. Musculoskeletal: No chest wall abnormality. No suspicious lytic or blastic osseous lesions. No acute displaced fracture. Kyphoplasty of the thoracic spine. Review of the MIP images confirms the above findings. IMPRESSION: 1. No pulmonary embolus. 2. Interval increase  in size of right middle lobe and left lower lobe pulmonary masses as well as right lower lobe pulmonary nodules in a patient with known lung cancer. 3. Stable bilateral adrenal gland nodules-consistent with known adrenal adenoma. 4.  Aortic Atherosclerosis (ICD10-I70.0). Electronically Signed   By: Tish Frederickson M.D.   On: 12/29/2023 02:42   DG Chest 2 View Result Date: 12/29/2023 CLINICAL DATA:  Cough, lung cancer, hemoptysis EXAM: CHEST - 2 VIEW COMPARISON:  CT 11/26/2023 FINDINGS: Lobular mass within the medial, basilar right middle lobe is again identified in keeping with the patient's known primary pulmonary malignancy. Large bleb again noted adjacent to the mass. No superimposed confluent pulmonary infiltrate. No pneumothorax or pleural effusion. Left internal jugular chest port tip seen expected superior vena cava. Cardiac size within normal limits. No acute bone abnormality. Mild artifact related to the patient's wheelchair IMPRESSION: 1. Right middle lobe mass in keeping with the patient's known primary pulmonary malignancy. No superimposed acute cardiopulmonary disease. Electronically Signed   By: Helyn Numbers M.D.   On: 12/29/2023 01:58   NM PET Image Restag (PS) Skull Base To Thigh Result Date: 12/16/2023 CLINICAL DATA:  Subsequent treatment strategy for non-small cell lung cancer. EXAM: NUCLEAR MEDICINE PET SKULL BASE TO THIGH TECHNIQUE: 10.4 mCi F-18 FDG was injected intravenously. Full-ring PET imaging was performed from the skull base to thigh after the radiotracer. CT data was obtained and used for attenuation correction and anatomic localization. Fasting blood glucose: 101 mg/dl COMPARISON:  CT chest abdomen pelvis 11/26/2023 and PET 04/19/2023. FINDINGS: Mediastinal blood pool activity: SUV max 3.0 Liver activity: SUV max NA NECK: No abnormal hypermetabolism. Incidental CT findings: None. CHEST: Low right internal jugular lymph node measures 7 mm, SUV max 2.8. Hypermetabolic mediastinal  and bihilar lymph nodes. Index low right paratracheal lymph node measures 1.4 cm (6/53), SUV max 6.0, stable. 5.2 x 6.2 cm right middle lobe mass, SUV max 14.4, previously 17.9. Left lower lobe mass is new, measuring 3.5 x 4.8 cm (6/78), SUV max 13.3. 1.5 cm subpleural posterolateral right lower lobe nodule (6/76), similar, SUV max 2.6. No additional abnormal hypermetabolism. Incidental CT findings: Left IJ Port-A-Cath terminates in the low SVC. Atherosclerotic calcification of the aorta, aortic valve and coronary arteries. Heart is at the upper limits of normal in size. No pericardial or pleural effusion. Centrilobular and paraseptal emphysema. ABDOMEN/PELVIS: Periportal  hypermetabolic adenopathy with index lymph node measuring 17 mm (6/89), SUV max 5.4, similar. No additional abnormal hypermetabolism. Incidental CT findings: 2.4 cm right adrenal nodule measures -5 Hounsfield units and 1.7 cm left adrenal nodule, 4 Hounsfield units. No specific follow-up necessary. Scarring in the right kidney. Gastric wall thickening. SKELETON: No abnormal hypermetabolism. Incidental CT findings: Degenerative changes in the spine. Lower thoracic vertebral body augmentation. IMPRESSION: 1. Disease progression as evidenced by hypermetabolic nodules and masses in the lungs bilaterally as well as hypermetabolic metastatic mediastinal, hilar and periportal lymph nodes. 2. Bilateral adrenal adenomas. 3. Gastric wall thickening. 4. Aortic atherosclerosis (ICD10-I70.0). Coronary artery calcification. 5.  Emphysema (ICD10-J43.9). Electronically Signed   By: Leanna Battles M.D.   On: 12/16/2023 15:55    Michaelyn Barter, MD   12/30/2023 1:12 PM

## 2023-12-30 NOTE — Progress Notes (Signed)
 Initial Nutrition Assessment  DOCUMENTATION CODES:   Not applicable  INTERVENTION:   -Liberalize diet to regular for widest variety of meal selections -MVI with minerals daily -Ensure Enlive po BID, each supplement provides 350 kcal and 20 grams of protein.   NUTRITION DIAGNOSIS:   Increased nutrient needs related to chronic illness (COPD) as evidenced by estimated needs.  GOAL:   Patient will meet greater than or equal to 90% of their needs  MONITOR:   PO intake  REASON FOR ASSESSMENT:   Consult Assessment of nutrition requirement/status  ASSESSMENT:   Pt with medical history significant of hypertension, alcohol use, hyperlipidemia, CAD status post stent, GERD, BPH, asthma and COPD overlap, chronic respiratory failure on 2L, pulmonary fibrosis, alpha-1 antitrypsin deficiency carrier presenting with hemoptysis.  Patient reports 1 episode of moderate to large hemoptysis earlier in the morning.  Pt admitted with hemoptysis, COPD exacerbation, and idiopathic pulmonary fibrosis.   Reviewed I/O's: -700 ml x 24 hours  UOP: 700 ml x 24 hours  Per oncology notes, plan for CT simulation tomorrow with plans to start radiation early next week. Pt is followed by Dr Rushie Chestnut.   Spoke with pt at bedside, who was pleasant and in good spirits today. Pt consumed all of his breakfast this morning. Pt reports he has a good appetite and consumes 3 meals per day (meat, starch, and vegetable). Meal completions 100%.   Reviewed wt hx; pt has experienced a 5.8% wt loss over the past month, which is significant for time frame. Per pt, his UBW is around 190#. He estimates he has lost about 10# over the past month, which he attributes to his lung cancer. He denies any difficulty in his mobility status. Pt shares that he has had multiple health challenges since [address]81, related to back surgery related to steroid use, multiple rounds of cancer treatments, as well as oxygen treatments from COPD.    Discussed importance of good meal and supplement intake to promote healing. Pt shares he has been offered supplements in the past, but did not consume them "because my kids drank them". Pt amenbale to try supplements to support healing.   Medications reviewed and include mucinex, protonix, and prednisone.   Labs reviewed: Na: 134, Phos: 2.1, Mg: 1.4, CBGS: 101 (inpatient orders for glycemic control are none).    NUTRITION - FOCUSED PHYSICAL EXAM:  Flowsheet Row Most Recent Value  Orbital Region No depletion  Upper Arm Region Mild depletion  Thoracic and Lumbar Region No depletion  Buccal Region No depletion  Temple Region No depletion  Clavicle Bone Region No depletion  Clavicle and Acromion Bone Region No depletion  Scapular Bone Region No depletion  Dorsal Hand Mild depletion  Patellar Region No depletion  Anterior Thigh Region No depletion  Posterior Calf Region No depletion  Edema (RD Assessment) Mild  Hair Reviewed  Eyes Reviewed  Mouth Reviewed  Skin Reviewed  Nails Reviewed       Diet Order:   Diet Order             Diet regular Room service appropriate? Yes; Fluid consistency: Thin  Diet effective now                   EDUCATION NEEDS:   Education needs have been addressed  Skin:  Skin Assessment: Reviewed RN Assessment  Last BM:  12/29/23  Height:   Ht Readings from Last 1 Encounters:  12/29/23 5\' 7"  (1.702 m)    Weight:   Wt  Readings from Last 1 Encounters:  12/29/23 81.6 kg    Ideal Body Weight:  67.3 kg  BMI:  Body mass index is 28.19 kg/m.  Estimated Nutritional Needs:   Kcal:  1800-2000  Protein:  90-105 grams  Fluid:  > 1.8 L    Levada Schilling, RD, LDN, CDCES Registered Dietitian III Certified Diabetes Care and Education Specialist If unable to reach this RD, please use "RD Inpatient" group chat on secure chat between hours of 8am-4 pm daily

## 2023-12-31 ENCOUNTER — Ambulatory Visit

## 2023-12-31 ENCOUNTER — Other Ambulatory Visit: Payer: Self-pay | Admitting: *Deleted

## 2023-12-31 ENCOUNTER — Encounter: Payer: Self-pay | Admitting: Internal Medicine

## 2023-12-31 DIAGNOSIS — R042 Hemoptysis: Secondary | ICD-10-CM | POA: Diagnosis not present

## 2023-12-31 DIAGNOSIS — C3491 Malignant neoplasm of unspecified part of right bronchus or lung: Secondary | ICD-10-CM | POA: Insufficient documentation

## 2023-12-31 DIAGNOSIS — Z510 Encounter for antineoplastic radiation therapy: Secondary | ICD-10-CM | POA: Insufficient documentation

## 2023-12-31 DIAGNOSIS — Z87891 Personal history of nicotine dependence: Secondary | ICD-10-CM | POA: Insufficient documentation

## 2023-12-31 DIAGNOSIS — D381 Neoplasm of uncertain behavior of trachea, bronchus and lung: Secondary | ICD-10-CM

## 2023-12-31 LAB — CBC
HCT: 36.9 % — ABNORMAL LOW (ref 39.0–52.0)
Hemoglobin: 12 g/dL — ABNORMAL LOW (ref 13.0–17.0)
MCH: 32.4 pg (ref 26.0–34.0)
MCHC: 32.5 g/dL (ref 30.0–36.0)
MCV: 99.7 fL (ref 80.0–100.0)
Platelets: 296 10*3/uL (ref 150–400)
RBC: 3.7 MIL/uL — ABNORMAL LOW (ref 4.22–5.81)
RDW: 14 % (ref 11.5–15.5)
WBC: 8.9 10*3/uL (ref 4.0–10.5)
nRBC: 0 % (ref 0.0–0.2)

## 2023-12-31 LAB — BASIC METABOLIC PANEL
Anion gap: 9 (ref 5–15)
BUN: 20 mg/dL (ref 8–23)
CO2: 30 mmol/L (ref 22–32)
Calcium: 9.2 mg/dL (ref 8.9–10.3)
Chloride: 96 mmol/L — ABNORMAL LOW (ref 98–111)
Creatinine, Ser: 0.6 mg/dL — ABNORMAL LOW (ref 0.61–1.24)
GFR, Estimated: >60 mL/min (ref >60)
Glucose, Bld: 117 mg/dL — ABNORMAL HIGH (ref 70–99)
Potassium: 4 mmol/L (ref 3.5–5.1)
Sodium: 135 mmol/L (ref 135–145)

## 2023-12-31 LAB — MAGNESIUM: Magnesium: 1.9 mg/dL (ref 1.7–2.4)

## 2023-12-31 LAB — PHOSPHORUS: Phosphorus: 3.4 mg/dL (ref 2.5–4.6)

## 2023-12-31 MED ORDER — GUAIFENESIN-DM 100-10 MG/5ML PO SYRP: 10.0000 mL | ORAL_SOLUTION | Freq: Four times a day (QID) | ORAL | 0 refills | Status: DC | PRN

## 2023-12-31 MED ORDER — LORATADINE 10 MG PO TABS: 10.0000 mg | ORAL_TABLET | Freq: Every day | ORAL | Status: DC

## 2023-12-31 MED ORDER — GUAIFENESIN ER 600 MG PO TB12: 600.0000 mg | ORAL_TABLET | Freq: Two times a day (BID) | ORAL | 0 refills | Status: AC

## 2023-12-31 NOTE — Discharge Summary (Signed)
 [facility] Discharge Summary   Patient: Austin Patterson ZOX:[id number]  PCP: Gildardo Pounds, PA  Date of admission: 12/29/2023   Date of discharge:  12/31/2023     Discharge Diagnoses:  Principal Problem:   Hemoptysis Active Problems:   CAD (coronary artery disease)   Reflux esophagitis   Essential hypertension   Alcohol use   Stage IV adenocarcinoma of lung (HCC)   Cancer related pain   Admitted From: Home Disposition:  Home   Recommendations for Outpatient Follow-up:  F/u PCP in 1 wk F/u Radiation Onco and Med Oncology next week and resume Plavix when cleared Follow up LABS/TEST:     Follow-up Information     Unknown Foley A, PA Follow up on 01/11/2024.   Specialty: Physician Assistant Why: Appt @ 12 pm Contact information: [address] [place] [address] [phone]                Diet recommendation: Regular diet  Activity: The patient is advised to gradually reintroduce usual activities, as tolerated  Discharge Condition: stable  Code Status: DNR /DNI-Limited  History of present illness: As per the H and P dictated on admission Hospital Course:  Austin Patterson is a 68 y.o. 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  Patient reports 1 episode of moderate to large hemoptysis earlier in the morning.  Patient reports coughing up a large amount of blood into the sink.  Denies any prior episodes like this in the past.  Noted to be followed by Dr. Michae Kava in the outpatient setting for stage IV lung cancer.  Has had multiple rounds of radiation as well as chemotherapy.  No longer smoking.  Denies any episodes of chest trauma.  No anticoagulation.  Noted to be on aspirin and Plavix in the setting of coronary disease.  No recent  dosing changes.  No reported alcohol use.  No abdominal pain or diarrhea.  No fevers or chills.  Fairly stable on 2 L nasal cannula at home.  Positive mild wheezing as well as increased home inhaler use. Presented to the ER afebrile, hemodynamically stable.  Satting in the mid 90s on 2 to 3 L.  White count 8.1, hemoglobin 11.5, platelets 286, COVID flu and RSV negative.  Creatinine 0.63.  CTA chest negative for PE but does show interval increase in size of the right middle lobe and left lower lobe pulmonary masses.  Stable known adrenal adenoma.     Assessment and Plan:   # Hemoptysis, Resolved # Chronic Resp failure on 2L  # COPD Exacerbation, improved  # Idiopathic Pulmonary Fibrosis  1 episode of large hemoptysis in setting of baseline stage 4 lung cancer, pulmonary fibrosis and COPD. CTA negative for PE-noted interval increased in lung masses on imaging  Stable on 2L [place] at present- chronic, Hgb stable at 12.0, Suspect mild COPD Exacerbation given diffuse wheezing, increased WOB and increased home inhaler use.  S/p course of COPD Exacerbation treatment including solumedrol, duonebs, IV azithromycin  Dr. Jayme Cloud with pulmonology and Dr. Alena Bills with oncology made aware of case. Hold ASA and plavix during hospital stay.  Aspirin resumed on discharge. Palliative care consulted 3/13 Patient was seen by radiation oncologist, recommended, set up for simulation on 3/14,and start his treatment to soon as possible early next week.  Seen by medical oncologist, recommended weekly  carboplatin AUC 2 and Taxol 45 mg/m along with radiation for 6 weeks. Then consolidate with 2 cycles of carboplatin and gemcitabine. schedule for cycle of chemotherapy next Thursday as outpatient.  As per patient, oncologist advised to hold Plavix until next week treatment.  So patient was advised to follow-up with oncologist and to resume Plavix as an outpatient when cleared.   # Stage IV adenocarcinoma of lung  Baseline  stage 4 lung cancer- noted worsening lesion on CTA chest  Followed by Dr. Alena Bills w/ oncology- request palliative care evaluation    # CAD (coronary artery disease) Baseline CAD s/p stenting several years ago, No active chest pain at present  Hold plavix for now in setting of hemoptysis and resume when cleared by Oncologist  # Cancer related pain: Cont home oxycodone  # Alcohol use: No reported active use at present  # Essential hypertension, BP stable, continued home regimen  # Reflux esophagitis: PPI   Body mass index is 28.19 kg/m.  Nutrition Problem: Increased nutrient needs Etiology: chronic illness (COPD) Nutrition Interventions: Interventions: Ensure Enlive (each supplement provides 350kcal and 20 grams of protein), MVI, Liberalize Diet  - Patient was instructed, not to drive, operate heavy machinery, perform activities at heights, swimming or participation in water activities or provide baby sitting services while on Pain, Sleep and Anxiety Medications; until his outpatient Physician has advised to do so again.  - Also recommended to not to take more than prescribed Pain, Sleep and Anxiety Medications.  Patient was ambulatory without any assistance. On the day of the discharge the patient's vitals were stable, and no other acute medical condition were reported by patient. the patient was felt safe to be discharge at Home.  Consultants: Radiation oncologist, medical oncologist, and palliative care Procedures: None  Discharge Exam: General: Appear in no distress, no Rash; Oral Mucosa Clear, moist. Cardiovascular: S1 and S2 Present, no Murmur, Respiratory: normal respiratory effort, Bilateral Air entry present and no Crackles, no wheezes Abdomen: Bowel Sound present, Soft and no tenderness, no hernia Extremities: no Pedal edema, no calf tenderness Neurology: alert and oriented to time, place, and person affect appropriate.  Filed Weights   12/29/23 0027  Weight: 81.6 kg    Vitals:   12/31/23 0734 12/31/23 0737  BP: (!) 153/72   Pulse: 76   Resp: 18   Temp: 97.7 F (36.5 C)   SpO2: 95% 97%    DISCHARGE MEDICATION: Allergies as of 12/31/2023       Reactions   Amoxicillin Anaphylaxis   Tizanidine    Feet and ankle swell         Medication List     PAUSE taking these medications    clopidogrel 75 MG tablet Wait to take this until: January 05, 2024 Commonly known as: PLAVIX Take 1 tablet (75 mg total) by mouth daily.       TAKE these medications    acetaminophen 500 MG tablet Commonly known as: TYLENOL Take 1,000 mg by mouth every 6 (six) hours as needed for moderate pain or headache.   AeroChamber MV inhaler Use as instructed   albuterol 108 (90 Base) MCG/ACT inhaler Commonly known as: VENTOLIN HFA Inhale 2 puffs into the lungs every 6 (six) hours as needed for wheezing or shortness of breath.   amLODipine 5 MG tablet Commonly known as: NORVASC Take 1 tablet (5 mg total) by mouth at bedtime. Skip the dose if systolic BP less than 130 mmHg   aspirin EC 81 MG tablet  Take 1 tablet (81 mg total) by mouth daily.   atorvastatin 40 MG tablet Commonly known as: LIPITOR Take 1 tablet (40 mg total) by mouth at bedtime.   Calcium 600/Vitamin D 600-10 MG-MCG Tabs Generic drug: Calcium Carb-Cholecalciferol Take 1 tablet by mouth 2 (two) times daily.   Dupixent 300 MG/2ML Soaj Generic drug: Dupilumab Inject 300 mg into the skin every 14 (fourteen) days.   EPINEPHrine 0.3 mg/0.3 mL Soaj injection Commonly known as: EPI-PEN Inject 0.3 mg into the muscle as needed for anaphylaxis.   ferrous sulfate 325 (65 FE) MG tablet Take 1 tablet (325 mg total) by mouth 2 (two) times daily with a meal. What changed: when to take this   gabapentin 300 MG capsule Commonly known as: NEURONTIN TAKE 1 CAPSULE BY MOUTH THREE TIMES A DAY   guaiFENesin 600 MG 12 hr tablet Commonly known as: MUCINEX Take 1 tablet (600 mg total) by mouth 2 (two)  times daily for 3 days.   guaiFENesin-dextromethorphan 100-10 MG/5ML syrup Commonly known as: ROBITUSSIN DM Take 10 mLs by mouth every 6 (six) hours as needed for cough.   ipratropium-albuterol 0.5-2.5 (3) MG/3ML Soln Commonly known as: DUONEB INHALE 1 VIAL THROUGH NEBULIZER EVERY 6 HOURS What changed: See the new instructions.   lidocaine-prilocaine cream Commonly known as: EMLA Apply to affected area once What changed:  how much to take how to take this when to take this   magnesium oxide 400 (241.3 Mg) MG tablet Commonly known as: MAG-OX Take 1 tablet (400 mg total) by mouth daily.   metoprolol succinate 100 MG 24 hr tablet Commonly known as: TOPROL-XL Take 1 tablet (100 mg total) by mouth daily.   montelukast 10 MG tablet Commonly known as: SINGULAIR Take 10 mg by mouth daily.   multivitamin with minerals Tabs tablet Take 1 tablet by mouth daily.   omega-3 acid ethyl esters 1 g capsule Commonly known as: LOVAZA Take 1 g by mouth daily.   oxyCODONE 5 MG immediate release tablet Commonly known as: Oxy IR/ROXICODONE Take 1 tablet (5 mg total) by mouth every 8 (eight) hours as needed for severe pain (pain score 7-10).   pantoprazole 40 MG tablet Commonly known as: PROTONIX Take 1 tablet (40 mg total) by mouth 2 (two) times daily.   potassium chloride SA 20 MEQ tablet Commonly known as: KLOR-CON M Take 20 mEq by mouth daily.   predniSONE 10 MG tablet Commonly known as: DELTASONE TAKE 1 TABLET (10 MG TOTAL) BY MOUTH DAILY AS NEEDED. TAKE AS DIRECTED.       Allergies  Allergen Reactions   Amoxicillin Anaphylaxis   Tizanidine     Feet and ankle swell    Discharge Instructions     Call MD for:  difficulty breathing, headache or visual disturbances   Complete by: As directed    Call MD for:  extreme fatigue   Complete by: As directed    Call MD for:  persistant dizziness or light-headedness   Complete by: As directed    Call MD for:  persistant nausea  and vomiting   Complete by: As directed    Call MD for:  severe uncontrolled pain   Complete by: As directed    Call MD for:  temperature >100.4   Complete by: As directed    Diet - low sodium heart healthy   Complete by: As directed    Discharge instructions   Complete by: As directed    F/u PCP in 1 wk  F/u Radiation Onco and Med Oncology next week and resume Plavix when cleared   Increase activity slowly   Complete by: As directed        The results of significant diagnostics from this hospitalization (including imaging, microbiology, ancillary and laboratory) are listed below for reference.    Significant Diagnostic Studies: CT Angio Chest PE W and/or Wo Contrast Result Date: 12/29/2023 CLINICAL DATA:  hemoptysis, hx lung CA EXAM: CT ANGIOGRAPHY CHEST WITH CONTRAST TECHNIQUE: Multidetector CT imaging of the chest was performed using the standard protocol during bolus administration of intravenous contrast. Multiplanar CT image reconstructions and MIPs were obtained to evaluate the vascular anatomy. RADIATION DOSE REDUCTION: This exam was performed according to the departmental dose-optimization program which includes automated exposure control, adjustment of the mA and/or kV according to patient size and/or use of iterative reconstruction technique. CONTRAST:  75mL OMNIPAQUE IOHEXOL 350 MG/ML SOLN COMPARISON:  CT chest 11/26/2023 FINDINGS: Cardiovascular: Left chest wall dialysis catheter with tip at the distal superior vena cava. Satisfactory opacification of the pulmonary arteries to the segmental level. No evidence of pulmonary embolism. Normal heart size. No significant pericardial effusion. The thoracic aorta is normal in caliber. Severe atherosclerotic plaque of the thoracic aorta. Four-vessel coronary artery calcifications. Mediastinum/Nodes: Prominent mediastinal lymph nodes. No enlarged mediastinal, hilar, or axillary lymph nodes. Thyroid gland, trachea, and esophagus demonstrate no  significant findings. Lungs/Pleura: Severe paraseptal and centrilobular emphysematous changes. No focal consolidation. No pulmonary nodule. Interval increase in size of a lobulated right middle lobe pulmonary mass measuring 6.5 x 6.4 cm (from 4.8 x 5.2 cm). Interval increase in size of the left lower lobe pulmonary nodule. Interval increase in size of right lower lobe pulmonary nodules. No pleural effusion. No pneumothorax. Upper Abdomen: Stable bilateral adrenal gland nodules. Musculoskeletal: No chest wall abnormality. No suspicious lytic or blastic osseous lesions. No acute displaced fracture. Kyphoplasty of the thoracic spine. Review of the MIP images confirms the above findings. IMPRESSION: 1. No pulmonary embolus. 2. Interval increase in size of right middle lobe and left lower lobe pulmonary masses as well as right lower lobe pulmonary nodules in a patient with known lung cancer. 3. Stable bilateral adrenal gland nodules-consistent with known adrenal adenoma. 4.  Aortic Atherosclerosis (ICD10-I70.0). Electronically Signed   By: Tish Frederickson M.D.   On: 12/29/2023 02:42   DG Chest 2 View Result Date: 12/29/2023 CLINICAL DATA:  Cough, lung cancer, hemoptysis EXAM: CHEST - 2 VIEW COMPARISON:  CT 11/26/2023 FINDINGS: Lobular mass within the medial, basilar right middle lobe is again identified in keeping with the patient's known primary pulmonary malignancy. Large bleb again noted adjacent to the mass. No superimposed confluent pulmonary infiltrate. No pneumothorax or pleural effusion. Left internal jugular chest port tip seen expected superior vena cava. Cardiac size within normal limits. No acute bone abnormality. Mild artifact related to the patient's wheelchair IMPRESSION: 1. Right middle lobe mass in keeping with the patient's known primary pulmonary malignancy. No superimposed acute cardiopulmonary disease. Electronically Signed   By: Helyn Numbers M.D.   On: 12/29/2023 01:58   NM PET Image Restag  (PS) Skull Base To Thigh Result Date: 12/16/2023 CLINICAL DATA:  Subsequent treatment strategy for non-small cell lung cancer. EXAM: NUCLEAR MEDICINE PET SKULL BASE TO THIGH TECHNIQUE: 10.4 mCi F-18 FDG was injected intravenously. Full-ring PET imaging was performed from the skull base to thigh after the radiotracer. CT data was obtained and used for attenuation correction and anatomic localization. Fasting blood glucose: 101 mg/dl COMPARISON:  CT chest abdomen pelvis 11/26/2023 and PET 04/19/2023. FINDINGS: Mediastinal blood pool activity: SUV max 3.0 Liver activity: SUV max NA NECK: No abnormal hypermetabolism. Incidental CT findings: None. CHEST: Low right internal jugular lymph node measures 7 mm, SUV max 2.8. Hypermetabolic mediastinal and bihilar lymph nodes. Index low right paratracheal lymph node measures 1.4 cm (6/53), SUV max 6.0, stable. 5.2 x 6.2 cm right middle lobe mass, SUV max 14.4, previously 17.9. Left lower lobe mass is new, measuring 3.5 x 4.8 cm (6/78), SUV max 13.3. 1.5 cm subpleural posterolateral right lower lobe nodule (6/76), similar, SUV max 2.6. No additional abnormal hypermetabolism. Incidental CT findings: Left IJ Port-A-Cath terminates in the low SVC. Atherosclerotic calcification of the aorta, aortic valve and coronary arteries. Heart is at the upper limits of normal in size. No pericardial or pleural effusion. Centrilobular and paraseptal emphysema. ABDOMEN/PELVIS: Periportal hypermetabolic adenopathy with index lymph node measuring 17 mm (6/89), SUV max 5.4, similar. No additional abnormal hypermetabolism. Incidental CT findings: 2.4 cm right adrenal nodule measures -5 Hounsfield units and 1.7 cm left adrenal nodule, 4 Hounsfield units. No specific follow-up necessary. Scarring in the right kidney. Gastric wall thickening. SKELETON: No abnormal hypermetabolism. Incidental CT findings: Degenerative changes in the spine. Lower thoracic vertebral body augmentation. IMPRESSION: 1.  Disease progression as evidenced by hypermetabolic nodules and masses in the lungs bilaterally as well as hypermetabolic metastatic mediastinal, hilar and periportal lymph nodes. 2. Bilateral adrenal adenomas. 3. Gastric wall thickening. 4. Aortic atherosclerosis (ICD10-I70.0). Coronary artery calcification. 5.  Emphysema (ICD10-J43.9). Electronically Signed   By: Leanna Battles M.D.   On: 12/16/2023 15:55    Microbiology: Recent Results (from the past 240 hours)  Resp panel by RT-PCR (RSV, Flu A&B, Covid) Anterior Nasal Swab     Status: None   Collection Time: 12/29/23 12:27 AM   Specimen: Anterior Nasal Swab  Result Value Ref Range Status   SARS Coronavirus 2 by RT PCR NEGATIVE NEGATIVE Final    Comment: (NOTE) SARS-CoV-2 target nucleic acids are NOT DETECTED.  The SARS-CoV-2 RNA is generally detectable in upper respiratory specimens during the acute phase of infection. The lowest concentration of SARS-CoV-2 viral copies this assay can detect is 138 copies/mL. A negative result does not preclude SARS-Cov-2 infection and should not be used as the sole basis for treatment or other patient management decisions. A negative result may occur with  improper specimen collection/handling, submission of specimen other than nasopharyngeal swab, presence of viral mutation(s) within the areas targeted by this assay, and inadequate number of viral copies(<138 copies/mL). A negative result must be combined with clinical observations, patient history, and epidemiological information. The expected result is Negative.  Fact Sheet for Patients:  BloggerCourse.com  Fact Sheet for Healthcare Providers:  SeriousBroker.it  This test is no t yet approved or cleared by the [place] FDA and  has been authorized for detection and/or diagnosis of SARS-CoV-2 by FDA under an Emergency Use Authorization (EUA). This EUA will remain  in effect (meaning this  test can be used) for the duration of the COVID-19 declaration under Section 564(b)(1) of the Act, 21 U.S.C.section 360bbb-3(b)(1), unless the authorization is terminated  or revoked sooner.       Influenza A by PCR NEGATIVE NEGATIVE Final   Influenza B by PCR NEGATIVE NEGATIVE Final    Comment: (NOTE) The Xpert Xpress SARS-CoV-2/FLU/RSV plus assay is intended as an aid in the diagnosis of influenza from Nasopharyngeal swab specimens and should not be used as a sole  basis for treatment. Nasal washings and aspirates are unacceptable for Xpert Xpress SARS-CoV-2/FLU/RSV testing.  Fact Sheet for Patients: BloggerCourse.com  Fact Sheet for Healthcare Providers: SeriousBroker.it  This test is not yet approved or cleared by the [place] FDA and has been authorized for detection and/or diagnosis of SARS-CoV-2 by FDA under an Emergency Use Authorization (EUA). This EUA will remain in effect (meaning this test can be used) for the duration of the COVID-19 declaration under Section 564(b)(1) of the Act, 21 U.S.C. section 360bbb-3(b)(1), unless the authorization is terminated or revoked.     Resp Syncytial Virus by PCR NEGATIVE NEGATIVE Final    Comment: (NOTE) Fact Sheet for Patients: BloggerCourse.com  Fact Sheet for Healthcare Providers: SeriousBroker.it  This test is not yet approved or cleared by the [place] FDA and has been authorized for detection and/or diagnosis of SARS-CoV-2 by FDA under an Emergency Use Authorization (EUA). This EUA will remain in effect (meaning this test can be used) for the duration of the COVID-19 declaration under Section 564(b)(1) of the Act, 21 U.S.C. section 360bbb-3(b)(1), unless the authorization is terminated or revoked.  Performed at [facility], [address] [place], [place] [address]      Labs: CBC: Recent Labs   Lab 12/29/23 0026 12/30/23 0511 12/31/23 0457  WBC 8.1 5.7 8.9  NEUTROABS 5.3  --   --   HGB 11.5* 11.3* 12.0*  HCT 36.3* 35.2* 36.9*  MCV 100.8* 99.2 99.7  PLT 286 286 296   Basic Metabolic Panel: Recent Labs  Lab 12/29/23 0026 12/30/23 0511 12/31/23 0457  NA 131* 134* 135  K 3.6 4.1 4.0  CL 98 97* 96*  CO2 24 28 30   GLUCOSE 93 173* 117*  BUN 14 14 20   CREATININE 0.63 0.58* 0.60*  CALCIUM 8.7* 8.7* 9.2  MG  --   --  1.9  PHOS  --   --  3.4   Liver Function Tests: Recent Labs  Lab 12/30/23 0511  AST 18  ALT 16  ALKPHOS 49  BILITOT 0.6  PROT 6.8  ALBUMIN 3.8   No results for input(s): "LIPASE", "AMYLASE" in the last 168 hours. No results for input(s): "AMMONIA" in the last 168 hours. Cardiac Enzymes: No results for input(s): "CKTOTAL", "CKMB", "CKMBINDEX", "TROPONINI" in the last 168 hours. BNP (last 3 results) Recent Labs    04/03/23 1148 06/19/23 0444 08/06/23 1038  BNP 29.0 22.7 118.8*   CBG: No results for input(s): "GLUCAP" in the last 168 hours.  Time spent: 35 minutes  Signed:  Gillis Santa  [facility] 12/31/2023 12:23 PM

## 2023-12-31 NOTE — Addendum Note (Signed)
 Addended byMichaelyn Barter on: 12/31/2023 11:58 AM   Modules accepted: Orders

## 2023-12-31 NOTE — TOC Progression Note (Signed)
 Transition of Care [facility]) - Progression Note    Patient Details  Name: Austin Patterson MRN: [id number] Date of Birth: 05/17/1956  [facility]) CM/SW Contact  Marlowe Sax, RN Phone Number: 12/31/2023, 12:15 PM  Clinical Narrative:     I met with the patient and discussed DC plan and needs He will be doing cancer treatment next week, he is agreeable to [facility] and would like [facility], I notified [place], he has DME at home, he has oxygen at home thru [employer], he stated he has everything he needs, His friend Brett Canales will transport him  Expected Discharge Plan: Home w Home Health Services Barriers to Discharge: Barriers Resolved  Expected Discharge Plan and Services   Discharge Planning Services: CM Consult   Living arrangements for the past 2 months: Single Family Home Expected Discharge Date: 12/31/23               DME Arranged: N/A DME Agency: NA       HH Arranged: PT, OT HH Agency: [facility] Date HH Agency Contacted: 12/31/23 Time HH Agency Contacted: 1215 Representative spoke with at [facility] Agency: [place]   Social Determinants of Health (SDOH) Interventions SDOH Screenings   Food Insecurity: No Food Insecurity (12/29/2023)  Housing: Low Risk  (12/29/2023)  Transportation Needs: No Transportation Needs (12/29/2023)  Recent Concern: Transportation Needs - Unmet Transportation Needs (11/29/2023)  Utilities: Not At Risk (12/29/2023)  Social Connections: Socially Isolated (12/29/2023)  Tobacco Use: Medium Risk (12/29/2023)    Readmission Risk Interventions    06/14/2023   11:19 AM  Readmission Risk Prevention Plan  Transportation Screening Complete  Medication Review (RN Care Manager) Complete  PCP or Specialist appointment within 3-5 days of discharge Complete  SW Recovery Care/Counseling Consult Complete  Palliative Care Screening Not Applicable  Skilled Nursing Facility Not Applicable

## 2023-12-31 NOTE — Progress Notes (Signed)
 Pharmacist Chemotherapy Monitoring - Initial Assessment    Anticipated start date: 01/06/24   The following has been reviewed per standard work regarding the patient's treatment regimen: The patient's diagnosis, treatment plan and drug doses, and organ/hematologic function Lab orders and baseline tests specific to treatment regimen  The treatment plan start date, drug sequencing, and pre-medications Prior authorization status  Patient's documented medication list, including drug-drug interaction screen and prescriptions for anti-emetics and supportive care specific to the treatment regimen The drug concentrations, fluid compatibility, administration routes, and timing of the medications to be used The patient's access for treatment and lifetime cumulative dose history, if applicable  The patient's medication allergies and previous infusion related reactions, if applicable   Changes made to treatment plan:  N/A  Follow up needed:  N/A   Sharen Hones, [facility], 12/31/2023  3:40 PM

## 2024-01-03 DIAGNOSIS — Z87891 Personal history of nicotine dependence: Secondary | ICD-10-CM | POA: Diagnosis not present

## 2024-01-03 DIAGNOSIS — Z510 Encounter for antineoplastic radiation therapy: Secondary | ICD-10-CM | POA: Diagnosis present

## 2024-01-03 DIAGNOSIS — C3491 Malignant neoplasm of unspecified part of right bronchus or lung: Secondary | ICD-10-CM | POA: Diagnosis present

## 2024-01-04 ENCOUNTER — Institutional Professional Consult (permissible substitution): Admitting: Radiation Oncology

## 2024-01-04 ENCOUNTER — Encounter: Payer: Self-pay | Admitting: Internal Medicine

## 2024-01-04 ENCOUNTER — Other Ambulatory Visit: Payer: Self-pay

## 2024-01-04 ENCOUNTER — Ambulatory Visit
Admission: RE | Admit: 2024-01-04 | Discharge: 2024-01-04 | Disposition: A | Discharge status: Home / Self Care | Source: Ambulatory Visit | Attending: Radiation Oncology | Admitting: Radiation Oncology

## 2024-01-04 DIAGNOSIS — Z510 Encounter for antineoplastic radiation therapy: Secondary | ICD-10-CM | POA: Diagnosis not present

## 2024-01-04 LAB — RAD ONC ARIA SESSION SUMMARY
Course Elapsed Days: 0
Plan Fractions Treated to Date: 1
Plan Prescribed Dose Per Fraction: 2 Gy
Plan Total Fractions Prescribed: 15
Plan Total Prescribed Dose: 30 Gy
Reference Point Dosage Given to Date: 2 Gy
Reference Point Session Dosage Given: 2 Gy
Session Number: 1

## 2024-01-05 ENCOUNTER — Ambulatory Visit
Admission: RE | Admit: 2024-01-05 | Discharge: 2024-01-05 | Disposition: A | Discharge status: Home / Self Care | Source: Ambulatory Visit | Attending: Radiation Oncology | Admitting: Radiation Oncology

## 2024-01-05 ENCOUNTER — Other Ambulatory Visit: Payer: Self-pay

## 2024-01-05 DIAGNOSIS — Z510 Encounter for antineoplastic radiation therapy: Secondary | ICD-10-CM | POA: Diagnosis not present

## 2024-01-05 LAB — RAD ONC ARIA SESSION SUMMARY
Course Elapsed Days: 1
Plan Fractions Treated to Date: 2
Plan Prescribed Dose Per Fraction: 2 Gy
Plan Total Fractions Prescribed: 15
Plan Total Prescribed Dose: 30 Gy
Reference Point Dosage Given to Date: 4 Gy
Reference Point Session Dosage Given: 2 Gy
Session Number: 2

## 2024-01-06 ENCOUNTER — Inpatient Hospital Stay

## 2024-01-06 ENCOUNTER — Inpatient Hospital Stay (HOSPITAL_BASED_OUTPATIENT_CLINIC_OR_DEPARTMENT_OTHER): Admitting: Internal Medicine

## 2024-01-06 ENCOUNTER — Ambulatory Visit
Admission: RE | Admit: 2024-01-06 | Discharge: 2024-01-06 | Disposition: A | Discharge status: Home / Self Care | Source: Ambulatory Visit | Attending: Radiation Oncology | Admitting: Radiation Oncology

## 2024-01-06 ENCOUNTER — Inpatient Hospital Stay: Attending: Internal Medicine

## 2024-01-06 ENCOUNTER — Encounter: Payer: Self-pay | Admitting: Internal Medicine

## 2024-01-06 ENCOUNTER — Other Ambulatory Visit: Payer: Self-pay

## 2024-01-06 VITALS — BP 124/81 | HR 77 | Temp 97.0°F | Resp 16 | Wt 189.0 lb

## 2024-01-06 DIAGNOSIS — Z5111 Encounter for antineoplastic chemotherapy: Secondary | ICD-10-CM | POA: Insufficient documentation

## 2024-01-06 DIAGNOSIS — C3491 Malignant neoplasm of unspecified part of right bronchus or lung: Secondary | ICD-10-CM

## 2024-01-06 DIAGNOSIS — Z510 Encounter for antineoplastic radiation therapy: Secondary | ICD-10-CM | POA: Diagnosis not present

## 2024-01-06 DIAGNOSIS — E871 Hypo-osmolality and hyponatremia: Secondary | ICD-10-CM | POA: Insufficient documentation

## 2024-01-06 DIAGNOSIS — D509 Iron deficiency anemia, unspecified: Secondary | ICD-10-CM | POA: Diagnosis not present

## 2024-01-06 DIAGNOSIS — C342 Malignant neoplasm of middle lobe, bronchus or lung: Secondary | ICD-10-CM | POA: Diagnosis present

## 2024-01-06 DIAGNOSIS — Z515 Encounter for palliative care: Secondary | ICD-10-CM | POA: Insufficient documentation

## 2024-01-06 DIAGNOSIS — Z79899 Other long term (current) drug therapy: Secondary | ICD-10-CM | POA: Insufficient documentation

## 2024-01-06 DIAGNOSIS — Z7982 Long term (current) use of aspirin: Secondary | ICD-10-CM | POA: Insufficient documentation

## 2024-01-06 LAB — CMP (CANCER CENTER ONLY)
ALT: 18 U/L (ref 0–44)
AST: 23 U/L (ref 15–41)
Albumin: 4.1 g/dL (ref 3.5–5.0)
Alkaline Phosphatase: 47 U/L (ref 38–126)
Anion gap: 10 (ref 5–15)
BUN: 16 mg/dL (ref 8–23)
CO2: 28 mmol/L (ref 22–32)
Calcium: 9 mg/dL (ref 8.9–10.3)
Chloride: 91 mmol/L — ABNORMAL LOW (ref 98–111)
Creatinine: 0.64 mg/dL (ref 0.61–1.24)
GFR, Estimated: >60 mL/min (ref >60)
Glucose, Bld: 113 mg/dL — ABNORMAL HIGH (ref 70–99)
Potassium: 4 mmol/L (ref 3.5–5.1)
Sodium: 129 mmol/L — ABNORMAL LOW (ref 135–145)
Total Bilirubin: 0.8 mg/dL (ref 0.0–1.2)
Total Protein: 7.1 g/dL (ref 6.5–8.1)

## 2024-01-06 LAB — CBC WITH DIFFERENTIAL (CANCER CENTER ONLY)
Abs Immature Granulocytes: 0.08 10*3/uL — ABNORMAL HIGH (ref 0.00–0.07)
Basophils Absolute: 0 10*3/uL (ref 0.0–0.1)
Basophils Relative: 0 %
Eosinophils Absolute: 0.2 10*3/uL (ref 0.0–0.5)
Eosinophils Relative: 2 %
HCT: 35.2 % — ABNORMAL LOW (ref 39.0–52.0)
Hemoglobin: 11.5 g/dL — ABNORMAL LOW (ref 13.0–17.0)
Immature Granulocytes: 1 %
Lymphocytes Relative: 9 %
Lymphs Abs: 0.9 10*3/uL (ref 0.7–4.0)
MCH: 31.8 pg (ref 26.0–34.0)
MCHC: 32.7 g/dL (ref 30.0–36.0)
MCV: 97.2 fL (ref 80.0–100.0)
Monocytes Absolute: 1.1 10*3/uL — ABNORMAL HIGH (ref 0.1–1.0)
Monocytes Relative: 11 %
Neutro Abs: 7.8 10*3/uL — ABNORMAL HIGH (ref 1.7–7.7)
Neutrophils Relative %: 77 %
Platelet Count: 245 10*3/uL (ref 150–400)
RBC: 3.62 MIL/uL — ABNORMAL LOW (ref 4.22–5.81)
RDW: 13.8 % (ref 11.5–15.5)
WBC Count: 10.1 10*3/uL (ref 4.0–10.5)
nRBC: 0 % (ref 0.0–0.2)

## 2024-01-06 LAB — RAD ONC ARIA SESSION SUMMARY
Course Elapsed Days: 2
Plan Fractions Treated to Date: 3
Plan Prescribed Dose Per Fraction: 2 Gy
Plan Total Fractions Prescribed: 15
Plan Total Prescribed Dose: 30 Gy
Reference Point Dosage Given to Date: 6 Gy
Reference Point Session Dosage Given: 2 Gy
Session Number: 3

## 2024-01-06 MED ORDER — DEXAMETHASONE SODIUM PHOSPHATE 10 MG/ML IJ SOLN
10.0000 mg | Freq: Once | INTRAMUSCULAR | Status: AC
  Administered 2024-01-06: 10 mg via INTRAVENOUS
  Filled 2024-01-06: qty 1

## 2024-01-06 MED ORDER — PALONOSETRON HCL INJECTION 0.25 MG/5ML
0.2500 mg | Freq: Once | INTRAVENOUS | Status: AC
  Administered 2024-01-06: 0.25 mg via INTRAVENOUS
  Filled 2024-01-06: qty 5

## 2024-01-06 MED ORDER — SODIUM CHLORIDE 0.9 % IV SOLN
INTRAVENOUS | Status: DC
  Filled 2024-01-06: qty 250

## 2024-01-06 MED ORDER — HEPARIN SOD (PORK) LOCK FLUSH 100 UNIT/ML IV SOLN
500.0000 [IU] | Freq: Once | INTRAVENOUS | Status: AC | PRN
Start: 2024-01-06 — End: 2024-01-06
  Administered 2024-01-06: 500 [IU]
  Filled 2024-01-06: qty 5

## 2024-01-06 MED ORDER — SODIUM CHLORIDE 0.9 % IV SOLN
213.2000 mg | Freq: Once | INTRAVENOUS | Status: AC
  Administered 2024-01-06: 210 mg via INTRAVENOUS
  Filled 2024-01-06: qty 21

## 2024-01-06 MED ORDER — DIPHENHYDRAMINE HCL 50 MG/ML IJ SOLN
50.0000 mg | Freq: Once | INTRAMUSCULAR | Status: AC
  Administered 2024-01-06: 50 mg via INTRAVENOUS
  Filled 2024-01-06: qty 1

## 2024-01-06 MED ORDER — SODIUM CHLORIDE 0.9 % IV SOLN
45.0000 mg/m2 | Freq: Once | INTRAVENOUS | Status: AC
  Administered 2024-01-06: 90 mg via INTRAVENOUS
  Filled 2024-01-06: qty 15

## 2024-01-06 MED ORDER — FAMOTIDINE IN NACL 20-0.9 MG/50ML-% IV SOLN
20.0000 mg | Freq: Once | INTRAVENOUS | Status: AC
  Administered 2024-01-06: 20 mg via INTRAVENOUS

## 2024-01-06 NOTE — Progress Notes (Signed)
 Patient is doing better when it comes to his breathing. He is on 2 liters of O2. He had a CT scan on 12/29/2023, and Pet scan on 12/14/2023.

## 2024-01-06 NOTE — Progress Notes (Signed)
 [facility] CONSULT NOTE  Patient Care Team: Gildardo Pounds, PA as PCP - General (Physician Assistant) Debbe Odea, MD as PCP - Cardiology (Cardiology) Salena Saner, MD as Consulting Physician (Pulmonary Disease) Glory Buff, RN as Oncology Nurse Navigator Michaelyn Barter, MD as Consulting Physician (Oncology) Carmina Miller, MD as Consulting Physician (Radiation Oncology)   CANCER STAGING   Cancer Staging  Stage IV adenocarcinoma of lung [facility]) Staging form: Lung, AJCC 8th Edition - Clinical: Stage IV (cT4, cN3, cM1) - Signed by Michaelyn Barter, MD on 05/20/2023 Stage prefix: Initial diagnosis   ASSESSMENT & PLAN:  Austin Patterson 68 y.o. 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  Pathology showed poorly differentiated carcinoma with spindle cell component.  Tumor cells positive for CK and focal TTF-1.  The differential diagnosis includes pleomorphic carcinoma and carcinosarcoma.  Definitive classification cannot be performed on the biopsy specimen.  -Tempus testing showed PD-L1 0% with no targetable mutations.  -Proceeded with systemic treatment with concern for involvement of upper abdominal lymph nodes and pulmonary metastasis.  s/p dose reduced carboplatin, Taxol and Keytruda on 06/01/2023 x 4 cycles.  Poor functional status/ poor tolerance with two hospitalizations for sepsis/ and second for  weakness.  -CT imaging is unfortunately showing disease progression.  Increase in size of right middle lobe lung mass from 4.8 x 3 cm to 5.2 x 4.9 cm.  Left lower lobe nodule previously 0.8 x 0.8 to 3.5 x 2.2 cm.  Posterior right lung base nodule 1.8 x 1.7.  Previously 1.9 x 1.1 cm.  Stable appearance of mediastinal and right hilar lymph nodes.  This was followed by PET scan and another CTA chest when he was admitted in the hospital for hemoptysis.  In the span of 4 weeks, the lung lesions continue to grow.  -[id number] from Feb 2025 - no targetable mutations.  -Plan is for concurrent chemo RT to progressed lung lesions.  Labs reviewed and acceptable for treatment.  Will proceed with cycle 1 of weekly CarboTaxol.  Dr. Rushie Chestnut planning for 20 fractions.  # Chronic respiratory failure on 2 L oxygen # Asthma and COPD overlap # Idiopathic pulmonary fibrosis -Management per pulmonary.  On Dupixent  # Hypocalcemia -Vitamin D level is normal -Continue with calcium vitamin D supplements.  # Hyponatremia -Chronic in nature.  Sodium 129 at baseline.  # Iron deficiency anemia -Completed IV Venofer x 5 doses.  Hemoglobin improved to 12.  Iron panel has normalized.  # Cancer related pain -In mid chest.  Continue with oxycodone 5 mg Q8 as needed.   #CAD- on aspirin, plavix  # IV access-port removed on 06/14/23 due to sepsis.   -Inserted on left side on 07/30/23.  Orders Placed This Encounter  Procedures   CBC with Differential (Cancer Center Only)    Standing Status:   Future    Expected Date:   01/20/2024    Expiration Date:   01/19/2025   CMP (Cancer Center only)    Standing Status:   Future  Expected Date:   01/20/2024    Expiration Date:   01/19/2025   CBC with Differential (Cancer Center Only)    Standing Status:   Future    Expected Date:   01/27/2024    Expiration Date:   01/26/2025   CMP (Cancer Center only)    Standing Status:   Future    Expected Date:   01/27/2024    Expiration  Date:   01/26/2025   RTC in 1 week for APP, labs, cycle 2 carbo Taxol RTC in 2 weeks for MD visit, labs, cycle 3  The total time spent in the appointment was 30 minutes encounter with patients including review of chart and various tests results, discussions about plan of care and coordination of care plan   All questions were answered. The patient knows to call the clinic with any problems, questions or concerns. No barriers to learning was detected.  Michaelyn Barter, MD 3/20/202511:46 AM   HISTORY OF PRESENTING ILLNESS:  Austin Patterson 68 y.o. 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  Cannot rule out sarcomatoid component.  He is a remote smoker quit in 2016.  Prior smoked 2 packs a day since age 36.  He is on 2 L oxygen since 2017.  Lives alone.  Was in a wheelchair.  Does not have family close by.  Follows with Dr. Jayme Cloud.   Interval history Patient seen today as follow-up for labs and cycle 1 of weekly CarboTaxol. Was recently admitted at the hospital for an episode of hemoptysis and COPD exacerbation.  His breathing is improved today.  He is down to his baseline oxygen need of 2 L.  Started radiation on Tuesday.   I have reviewed his chart and materials related to his cancer extensively and collaborated history with the patient. Summary of oncologic history is as follows:  Oncology History  Stage IV adenocarcinoma of lung (HCC)  05/18/2023 Initial Diagnosis   Sarcomatoid carcinoma of lung (HCC)   05/20/2023 Cancer Staging   Staging form: Lung, AJCC 8th  Edition - Clinical: Stage IV (cT4, cN3, cM1) - Signed by Michaelyn Barter, MD on 05/20/2023 Stage prefix: Initial diagnosis   06/01/2023 - 11/08/2023 Chemotherapy   Patient is on Treatment Plan : LUNG NSCLC Carboplatin (6) + Paclitaxel (200) + Pembrolizumab (200) D1 q21d x 4 cycles / Pembrolizumab (200) Maintenance D1 q21d     01/06/2024 -  Chemotherapy   Patient is on Treatment Plan : LUNG Carboplatin + Paclitaxel + XRT q7d       MEDICAL HISTORY:  Past Medical History:  Diagnosis Date   Abdominal pain 12/07/2022   Acute urinary retention 12/07/2022   Chest pain 01/24/2022   COPD (chronic obstructive pulmonary disease) (HCC)    GERD (gastroesophageal reflux disease)    Hemothorax on right 05/11/2023   Hyperlipidemia    Hypertension    Pleuritic pain 05/10/2023   Pneumothorax after biopsy 05/11/2023   Post procedure discomfort 05/13/2023   Pulmonary fibrosis (HCC) 11/2015    SURGICAL HISTORY: Past Surgical History:  Procedure Laterality Date   COLONOSCOPY     CORONARY STENT PLACEMENT     ESOPHAGOGASTRODUODENOSCOPY (EGD) WITH PROPOFOL  N/A 09/23/2016   Procedure: ESOPHAGOGASTRODUODENOSCOPY (EGD) WITH PROPOFOL;  Surgeon: Wyline Mood, MD;  Location: [facility] ENDOSCOPY;  Service: Endoscopy;  Laterality: N/A;   IR IMAGING GUIDED PORT INSERTION  05/26/2023   IR IMAGING GUIDED PORT INSERTION  07/30/2023   IR RADIOLOGIST EVAL & MGMT  07/20/2023   KYPHOPLASTY N/A 03/14/2020   Procedure: T7 & T11 KYPHOPLASTY;  Surgeon: Kennedy Bucker, MD;  Location: [facility] ORS;  Service: Orthopedics;  Laterality: N/A;   PORT-A-CATH REMOVAL N/A 06/14/2023   Procedure: REMOVAL PORT-A-CATH;  Surgeon: Sung Amabile, DO;  Location: [facility] ORS;  Service: General;  Laterality: N/A;   SHOULDER ACROMIOPLASTY      SOCIAL HISTORY: Social History   Socioeconomic History   Marital status: Divorced    Spouse name: Not on file   Number of children: Not on file   Years of education: Not on file   Highest education level: Not on  file  Occupational History   Not on file  Tobacco Use   Smoking status: Former    Current packs/day: 0.00    Average packs/day: 2.0 packs/day for 50.0 years (100.0 ttl pk-yrs)    Types: Cigarettes    Start date: 10/13/1965    Quit date: 10/14/2015    Years since quitting: 8.2   Smokeless tobacco: Former  Building services engineer status: Never Used  Substance and Sexual Activity   Alcohol use: Yes    Alcohol/week: 56.0 standard drinks of alcohol    Types: 56 Cans of beer per week    Comment: "I sit around and drink beer, that's all I got to do"   Drug use: No   Sexual activity: Not Currently  Other Topics Concern   Not on file  Social History Narrative   Not on file   Social Drivers of Health   Financial Resource Strain: Not on file  Food Insecurity: No Food Insecurity (12/29/2023)   Hunger Vital Sign    Worried About Running Out of Food in the Last Year: Never true    Ran Out of Food in the Last Year: Never true  Transportation Needs: No Transportation Needs (12/29/2023)   PRAPARE - Administrator, Civil Service (Medical): No    Lack of Transportation (Non-Medical): No  Recent Concern: Transportation Needs - Unmet Transportation Needs (11/29/2023)   PRAPARE - Transportation    Lack of Transportation (Medical): Yes    Lack of Transportation (Non-Medical): Yes  Physical Activity: Not on file  Stress: Not on file  Social Connections: Socially Isolated (12/29/2023)   Social Connection and Isolation Panel [NHANES]    Frequency of Communication with Friends and Family: Three times a week    Frequency of Social Gatherings with Friends and Family: Once a week    Attends Religious Services: Never    Database administrator or Organizations: No    Attends Banker Meetings: Never    Marital Status: Divorced  Catering manager Violence: Not At Risk (12/29/2023)   Humiliation, Afraid, Rape, and Kick questionnaire    Fear of Current or Ex-Partner: No    Emotionally  Abused: No    Physically Abused: No    Sexually Abused: No    FAMILY HISTORY: Family History  Problem Relation Age of Onset   Heart disease Mother     ALLERGIES:  is allergic to amoxicillin and tizanidine.  MEDICATIONS:  Current Outpatient Medications  Medication Sig Dispense Refill   acetaminophen (TYLENOL) 500 MG tablet Take 1,000 mg by  mouth every 6 (six) hours as needed for moderate pain or headache.     albuterol (VENTOLIN HFA) 108 (90 Base) MCG/ACT inhaler Inhale 2 puffs into the lungs every 6 (six) hours as needed for wheezing or shortness of breath. 8 g 11   amLODipine (NORVASC) 5 MG tablet Take 1 tablet (5 mg total) by mouth at bedtime. Skip the dose if systolic BP less than 130 mmHg 30 tablet 5   aspirin EC 81 MG tablet Take 1 tablet (81 mg total) by mouth daily. 90 tablet 3   atorvastatin (LIPITOR) 40 MG tablet Take 1 tablet (40 mg total) by mouth at bedtime. 90 tablet 3   CALCIUM 600/VITAMIN D 600-10 MG-MCG TABS Take 1 tablet by mouth 2 (two) times daily.     clopidogrel (PLAVIX) 75 MG tablet Take 1 tablet (75 mg total) by mouth daily. 90 tablet 3   Dupilumab (DUPIXENT) 300 MG/2ML SOPN Inject 300 mg into the skin every 14 (fourteen) days. 12 mL 1   EPINEPHrine 0.3 mg/0.3 mL IJ SOAJ injection Inject 0.3 mg into the muscle as needed for anaphylaxis.     ferrous sulfate 325 (65 FE) MG tablet Take 1 tablet (325 mg total) by mouth 2 (two) times daily with a meal. (Patient taking differently: Take 325 mg by mouth every other day.) 60 tablet 2   gabapentin (NEURONTIN) 300 MG capsule TAKE 1 CAPSULE BY MOUTH THREE TIMES A DAY 90 capsule 1   guaiFENesin-dextromethorphan (ROBITUSSIN DM) 100-10 MG/5ML syrup Take 10 mLs by mouth every 6 (six) hours as needed for cough. 118 mL 0   ipratropium-albuterol (DUONEB) 0.5-2.5 (3) MG/3ML SOLN INHALE 1 VIAL THROUGH NEBULIZER EVERY 6 HOURS (Patient taking differently: Take 3 mLs by nebulization every 6 (six) hours as needed.) 270 mL 2    lidocaine-prilocaine (EMLA) cream Apply to affected area once (Patient taking differently: Apply 1 Application topically as directed. Apply to affected area once) 30 g 3   magnesium oxide (MAG-OX) 400 (241.3 Mg) MG tablet Take 1 tablet (400 mg total) by mouth daily. 30 tablet 0   metoprolol succinate (TOPROL-XL) 100 MG 24 hr tablet Take 1 tablet (100 mg total) by mouth daily. 30 tablet 11   montelukast (SINGULAIR) 10 MG tablet Take 10 mg by mouth daily.     Multiple Vitamin (MULTIVITAMIN WITH MINERALS) TABS tablet Take 1 tablet by mouth daily. 30 tablet 0   omega-3 acid ethyl esters (LOVAZA) 1 g capsule Take 1 g by mouth daily.     oxyCODONE (OXY IR/ROXICODONE) 5 MG immediate release tablet Take 1 tablet (5 mg total) by mouth every 8 (eight) hours as needed for severe pain (pain score 7-10). 90 tablet 0   pantoprazole (PROTONIX) 40 MG tablet Take 1 tablet (40 mg total) by mouth 2 (two) times daily. 90 tablet 3   potassium chloride SA (KLOR-CON M) 20 MEQ tablet Take 20 mEq by mouth daily.     predniSONE (DELTASONE) 10 MG tablet TAKE 1 TABLET (10 MG TOTAL) BY MOUTH DAILY AS NEEDED. TAKE AS DIRECTED. 30 tablet 1   Spacer/Aero-Holding Chambers (AEROCHAMBER MV) inhaler Use as instructed 1 each 0   No current facility-administered medications for this visit.   Facility-Administered Medications Ordered in Other Visits  Medication Dose Route Frequency Provider Last Rate Last Admin   0.9 %  sodium chloride infusion   Intravenous Continuous Michaelyn Barter, MD 10 mL/hr at 01/06/24 1003 New Bag at 01/06/24 1003   CARBOplatin (PARAPLATIN) 210 mg in sodium  chloride 0.9 % 100 mL chemo infusion  210 mg Intravenous Once Michaelyn Barter, MD       PACLitaxel (TAXOL) 90 mg in sodium chloride 0.9 % 250 mL chemo infusion (</= 80mg /m2)  45 mg/m2 (Treatment Plan Recorded) Intravenous Once Michaelyn Barter, MD 265 mL/hr at 01/06/24 1103 90 mg at 01/06/24 1103    REVIEW OF SYSTEMS:   Pertinent information mentioned in  HPI All other systems were reviewed with the patient and are negative.  PHYSICAL EXAMINATION: ECOG PERFORMANCE STATUS: 3   Vitals:   01/06/24 0908  BP: 124/81  Pulse: 77  Resp: 16  Temp: (!) 97 F (36.1 C)  SpO2: 96%    Filed Weights   01/06/24 0908  Weight: 189 lb (85.7 kg)     GENERAL:alert, no distress and comfortable SKIN: skin color, texture, turgor are normal, no rashes or significant lesions EYES: normal, conjunctiva are pink and non-injected, sclera clear OROPHARYNX:no exudate, no erythema and lips, buccal mucosa, and tongue normal  NECK: supple, thyroid normal size, non-tender, without nodularity LYMPH:  no palpable lymphadenopathy in the cervical, axillary or inguinal LUNGS: clear to auscultation and percussion with normal breathing effort HEART: regular rate & rhythm and no murmurs and no lower extremity edema ABDOMEN:abdomen soft, non-tender and normal bowel sounds Musculoskeletal:no cyanosis of digits and no clubbing  PSYCH: alert & oriented x 3 with fluent speech NEURO: no focal motor/sensory deficits  LABORATORY DATA:  I have reviewed the data as listed Lab Results  Component Value Date   WBC 10.1 01/06/2024   HGB 11.5 (L) 01/06/2024   HCT 35.2 (L) 01/06/2024   MCV 97.2 01/06/2024   PLT 245 01/06/2024   Recent Labs    06/16/23 0824 06/17/23 0329 11/29/23 1307 12/29/23 0026 12/30/23 0511 12/31/23 0457 01/06/24 0829  NA 133*   < > 129*   < > 134* 135 129*  K 3.5   < > 4.2   < > 4.1 4.0 4.0  CL 98   < > 91*   < > 97* 96* 91*  CO2 28   < > 26   < > 28 30 28   GLUCOSE 152*   < > 103*   < > 173* 117* 113*  BUN 10   < > 14   < > 14 20 16   CREATININE 0.55*   < > 0.73   < > 0.58* 0.60* 0.64  CALCIUM 7.0*   < > 8.8*   < > 8.7* 9.2 9.0  GFRNONAA >60   < > >60   < > >60 >60 >60  PROT 6.5   < > 7.2  --  6.8  --  7.1  ALBUMIN 2.9*   < > 3.7  --  3.8  --  4.1  AST 18   < > 22  --  18  --  23  ALT 25   < > 17  --  16  --  18  ALKPHOS 87   < > 89  --   49  --  47  BILITOT 0.3   < > 0.6  --  0.6  --  0.8  BILIDIR <0.1  --   --   --   --   --   --   IBILI NOT CALCULATED  --   --   --   --   --   --    < > = values in this interval not displayed.    RADIOGRAPHIC STUDIES: I  have personally reviewed the radiological images as listed and agreed with the findings in the report. CT Angio Chest PE W and/or Wo Contrast Result Date: 12/29/2023 CLINICAL DATA:  hemoptysis, hx lung CA EXAM: CT ANGIOGRAPHY CHEST WITH CONTRAST TECHNIQUE: Multidetector CT imaging of the chest was performed using the standard protocol during bolus administration of intravenous contrast. Multiplanar CT image reconstructions and MIPs were obtained to evaluate the vascular anatomy. RADIATION DOSE REDUCTION: This exam was performed according to the departmental dose-optimization program which includes automated exposure control, adjustment of the mA and/or kV according to patient size and/or use of iterative reconstruction technique. CONTRAST:  75mL OMNIPAQUE IOHEXOL 350 MG/ML SOLN COMPARISON:  CT chest 11/26/2023 FINDINGS: Cardiovascular: Left chest wall dialysis catheter with tip at the distal superior vena cava. Satisfactory opacification of the pulmonary arteries to the segmental level. No evidence of pulmonary embolism. Normal heart size. No significant pericardial effusion. The thoracic aorta is normal in caliber. Severe atherosclerotic plaque of the thoracic aorta. Four-vessel coronary artery calcifications. Mediastinum/Nodes: Prominent mediastinal lymph nodes. No enlarged mediastinal, hilar, or axillary lymph nodes. Thyroid gland, trachea, and esophagus demonstrate no significant findings. Lungs/Pleura: Severe paraseptal and centrilobular emphysematous changes. No focal consolidation. No pulmonary nodule. Interval increase in size of a lobulated right middle lobe pulmonary mass measuring 6.5 x 6.4 cm (from 4.8 x 5.2 cm). Interval increase in size of the left lower lobe pulmonary  nodule. Interval increase in size of right lower lobe pulmonary nodules. No pleural effusion. No pneumothorax. Upper Abdomen: Stable bilateral adrenal gland nodules. Musculoskeletal: No chest wall abnormality. No suspicious lytic or blastic osseous lesions. No acute displaced fracture. Kyphoplasty of the thoracic spine. Review of the MIP images confirms the above findings. IMPRESSION: 1. No pulmonary embolus. 2. Interval increase in size of right middle lobe and left lower lobe pulmonary masses as well as right lower lobe pulmonary nodules in a patient with known lung cancer. 3. Stable bilateral adrenal gland nodules-consistent with known adrenal adenoma. 4.  Aortic Atherosclerosis (ICD10-I70.0). Electronically Signed   By: Tish Frederickson M.D.   On: 12/29/2023 02:42   DG Chest 2 View Result Date: 12/29/2023 CLINICAL DATA:  Cough, lung cancer, hemoptysis EXAM: CHEST - 2 VIEW COMPARISON:  CT 11/26/2023 FINDINGS: Lobular mass within the medial, basilar right middle lobe is again identified in keeping with the patient's known primary pulmonary malignancy. Large bleb again noted adjacent to the mass. No superimposed confluent pulmonary infiltrate. No pneumothorax or pleural effusion. Left internal jugular chest port tip seen expected superior vena cava. Cardiac size within normal limits. No acute bone abnormality. Mild artifact related to the patient's wheelchair IMPRESSION: 1. Right middle lobe mass in keeping with the patient's known primary pulmonary malignancy. No superimposed acute cardiopulmonary disease. Electronically Signed   By: Helyn Numbers M.D.   On: 12/29/2023 01:58   NM PET Image Restag (PS) Skull Base To Thigh Result Date: 12/16/2023 CLINICAL DATA:  Subsequent treatment strategy for non-small cell lung cancer. EXAM: NUCLEAR MEDICINE PET SKULL BASE TO THIGH TECHNIQUE: 10.4 mCi F-18 FDG was injected intravenously. Full-ring PET imaging was performed from the skull base to thigh after the  radiotracer. CT data was obtained and used for attenuation correction and anatomic localization. Fasting blood glucose: 101 mg/dl COMPARISON:  CT chest abdomen pelvis 11/26/2023 and PET 04/19/2023. FINDINGS: Mediastinal blood pool activity: SUV max 3.0 Liver activity: SUV max NA NECK: No abnormal hypermetabolism. Incidental CT findings: None. CHEST: Low right internal jugular lymph node measures 7 mm, SUV  max 2.8. Hypermetabolic mediastinal and bihilar lymph nodes. Index low right paratracheal lymph node measures 1.4 cm (6/53), SUV max 6.0, stable. 5.2 x 6.2 cm right middle lobe mass, SUV max 14.4, previously 17.9. Left lower lobe mass is new, measuring 3.5 x 4.8 cm (6/78), SUV max 13.3. 1.5 cm subpleural posterolateral right lower lobe nodule (6/76), similar, SUV max 2.6. No additional abnormal hypermetabolism. Incidental CT findings: Left IJ Port-A-Cath terminates in the low SVC. Atherosclerotic calcification of the aorta, aortic valve and coronary arteries. Heart is at the upper limits of normal in size. No pericardial or pleural effusion. Centrilobular and paraseptal emphysema. ABDOMEN/PELVIS: Periportal hypermetabolic adenopathy with index lymph node measuring 17 mm (6/89), SUV max 5.4, similar. No additional abnormal hypermetabolism. Incidental CT findings: 2.4 cm right adrenal nodule measures -5 Hounsfield units and 1.7 cm left adrenal nodule, 4 Hounsfield units. No specific follow-up necessary. Scarring in the right kidney. Gastric wall thickening. SKELETON: No abnormal hypermetabolism. Incidental CT findings: Degenerative changes in the spine. Lower thoracic vertebral body augmentation. IMPRESSION: 1. Disease progression as evidenced by hypermetabolic nodules and masses in the lungs bilaterally as well as hypermetabolic metastatic mediastinal, hilar and periportal lymph nodes. 2. Bilateral adrenal adenomas. 3. Gastric wall thickening. 4. Aortic atherosclerosis (ICD10-I70.0). Coronary artery calcification.  5.  Emphysema (ICD10-J43.9). Electronically Signed   By: Leanna Battles M.D.   On: 12/16/2023 15:55

## 2024-01-07 ENCOUNTER — Ambulatory Visit
Admission: RE | Admit: 2024-01-07 | Discharge: 2024-01-07 | Disposition: A | Discharge status: Home / Self Care | Source: Ambulatory Visit | Attending: Radiation Oncology | Admitting: Radiation Oncology

## 2024-01-07 ENCOUNTER — Other Ambulatory Visit: Payer: Self-pay

## 2024-01-07 DIAGNOSIS — Z510 Encounter for antineoplastic radiation therapy: Secondary | ICD-10-CM | POA: Diagnosis not present

## 2024-01-07 LAB — RAD ONC ARIA SESSION SUMMARY
Course Elapsed Days: 3
Plan Fractions Treated to Date: 4
Plan Prescribed Dose Per Fraction: 2 Gy
Plan Total Fractions Prescribed: 15
Plan Total Prescribed Dose: 30 Gy
Reference Point Dosage Given to Date: 8 Gy
Reference Point Session Dosage Given: 2 Gy
Session Number: 4

## 2024-01-10 ENCOUNTER — Other Ambulatory Visit: Payer: Self-pay

## 2024-01-10 ENCOUNTER — Ambulatory Visit
Admission: RE | Admit: 2024-01-10 | Discharge: 2024-01-10 | Disposition: A | Discharge status: Home / Self Care | Source: Ambulatory Visit | Attending: Radiation Oncology | Admitting: Radiation Oncology

## 2024-01-10 DIAGNOSIS — Z510 Encounter for antineoplastic radiation therapy: Secondary | ICD-10-CM | POA: Diagnosis not present

## 2024-01-10 LAB — RAD ONC ARIA SESSION SUMMARY
Course Elapsed Days: 6
Plan Fractions Treated to Date: 5
Plan Prescribed Dose Per Fraction: 2 Gy
Plan Total Fractions Prescribed: 15
Plan Total Prescribed Dose: 30 Gy
Reference Point Dosage Given to Date: 10 Gy
Reference Point Session Dosage Given: 2 Gy
Session Number: 5

## 2024-01-11 ENCOUNTER — Other Ambulatory Visit: Payer: Self-pay

## 2024-01-11 ENCOUNTER — Ambulatory Visit
Admission: RE | Admit: 2024-01-11 | Discharge: 2024-01-11 | Disposition: A | Discharge status: Home / Self Care | Source: Ambulatory Visit | Attending: Radiation Oncology | Admitting: Radiation Oncology

## 2024-01-11 DIAGNOSIS — Z510 Encounter for antineoplastic radiation therapy: Secondary | ICD-10-CM | POA: Diagnosis not present

## 2024-01-11 LAB — RAD ONC ARIA SESSION SUMMARY
Course Elapsed Days: 7
Plan Fractions Treated to Date: 6
Plan Prescribed Dose Per Fraction: 2 Gy
Plan Total Fractions Prescribed: 15
Plan Total Prescribed Dose: 30 Gy
Reference Point Dosage Given to Date: 12 Gy
Reference Point Session Dosage Given: 2 Gy
Session Number: 6

## 2024-01-12 ENCOUNTER — Other Ambulatory Visit: Payer: Self-pay

## 2024-01-12 ENCOUNTER — Ambulatory Visit
Admission: RE | Admit: 2024-01-12 | Discharge: 2024-01-12 | Disposition: A | Discharge status: Home / Self Care | Source: Ambulatory Visit | Attending: Radiation Oncology | Admitting: Radiation Oncology

## 2024-01-12 DIAGNOSIS — Z510 Encounter for antineoplastic radiation therapy: Secondary | ICD-10-CM | POA: Diagnosis not present

## 2024-01-12 LAB — RAD ONC ARIA SESSION SUMMARY
Course Elapsed Days: 8
Plan Fractions Treated to Date: 7
Plan Prescribed Dose Per Fraction: 2 Gy
Plan Total Fractions Prescribed: 15
Plan Total Prescribed Dose: 30 Gy
Reference Point Dosage Given to Date: 14 Gy
Reference Point Session Dosage Given: 2 Gy
Session Number: 7

## 2024-01-13 ENCOUNTER — Other Ambulatory Visit: Payer: Self-pay

## 2024-01-13 ENCOUNTER — Inpatient Hospital Stay

## 2024-01-13 ENCOUNTER — Encounter: Payer: Self-pay | Admitting: Nurse Practitioner

## 2024-01-13 ENCOUNTER — Inpatient Hospital Stay (HOSPITAL_BASED_OUTPATIENT_CLINIC_OR_DEPARTMENT_OTHER): Admitting: Nurse Practitioner

## 2024-01-13 ENCOUNTER — Ambulatory Visit
Admission: RE | Admit: 2024-01-13 | Discharge: 2024-01-13 | Disposition: A | Discharge status: Home / Self Care | Source: Ambulatory Visit | Attending: Radiation Oncology | Admitting: Radiation Oncology

## 2024-01-13 VITALS — BP 130/88 | HR 76 | Temp 97.3°F | Resp 14 | Wt 185.0 lb

## 2024-01-13 DIAGNOSIS — Z510 Encounter for antineoplastic radiation therapy: Secondary | ICD-10-CM | POA: Diagnosis not present

## 2024-01-13 DIAGNOSIS — C3491 Malignant neoplasm of unspecified part of right bronchus or lung: Secondary | ICD-10-CM

## 2024-01-13 DIAGNOSIS — Z5111 Encounter for antineoplastic chemotherapy: Secondary | ICD-10-CM | POA: Diagnosis not present

## 2024-01-13 LAB — CBC WITH DIFFERENTIAL (CANCER CENTER ONLY)
Abs Immature Granulocytes: 0.03 10*3/uL (ref 0.00–0.07)
Basophils Absolute: 0 10*3/uL (ref 0.0–0.1)
Basophils Relative: 1 %
Eosinophils Absolute: 0.2 10*3/uL (ref 0.0–0.5)
Eosinophils Relative: 3 %
HCT: 34.2 % — ABNORMAL LOW (ref 39.0–52.0)
Hemoglobin: 11 g/dL — ABNORMAL LOW (ref 13.0–17.0)
Immature Granulocytes: 1 %
Lymphocytes Relative: 11 %
Lymphs Abs: 0.6 10*3/uL — ABNORMAL LOW (ref 0.7–4.0)
MCH: 31.5 pg (ref 26.0–34.0)
MCHC: 32.2 g/dL (ref 30.0–36.0)
MCV: 98 fL (ref 80.0–100.0)
Monocytes Absolute: 0.4 10*3/uL (ref 0.1–1.0)
Monocytes Relative: 7 %
Neutro Abs: 4.2 10*3/uL (ref 1.7–7.7)
Neutrophils Relative %: 77 %
Platelet Count: 262 10*3/uL (ref 150–400)
RBC: 3.49 MIL/uL — ABNORMAL LOW (ref 4.22–5.81)
RDW: 13.6 % (ref 11.5–15.5)
WBC Count: 5.4 10*3/uL (ref 4.0–10.5)
nRBC: 0 % (ref 0.0–0.2)

## 2024-01-13 LAB — CMP (CANCER CENTER ONLY)
ALT: 19 U/L (ref 0–44)
AST: 21 U/L (ref 15–41)
Albumin: 4.1 g/dL (ref 3.5–5.0)
Alkaline Phosphatase: 47 U/L (ref 38–126)
Anion gap: 10 (ref 5–15)
BUN: 15 mg/dL (ref 8–23)
CO2: 29 mmol/L (ref 22–32)
Calcium: 9.1 mg/dL (ref 8.9–10.3)
Chloride: 93 mmol/L — ABNORMAL LOW (ref 98–111)
Creatinine: 0.66 mg/dL (ref 0.61–1.24)
GFR, Estimated: >60 mL/min (ref >60)
Glucose, Bld: 117 mg/dL — ABNORMAL HIGH (ref 70–99)
Potassium: 4.3 mmol/L (ref 3.5–5.1)
Sodium: 132 mmol/L — ABNORMAL LOW (ref 135–145)
Total Bilirubin: 0.6 mg/dL (ref 0.0–1.2)
Total Protein: 7 g/dL (ref 6.5–8.1)

## 2024-01-13 LAB — RAD ONC ARIA SESSION SUMMARY
Course Elapsed Days: 9
Plan Fractions Treated to Date: 8
Plan Prescribed Dose Per Fraction: 2 Gy
Plan Total Fractions Prescribed: 15
Plan Total Prescribed Dose: 30 Gy
Reference Point Dosage Given to Date: 16 Gy
Reference Point Session Dosage Given: 2 Gy
Session Number: 8

## 2024-01-13 MED ORDER — DIPHENHYDRAMINE HCL 50 MG/ML IJ SOLN
50.0000 mg | Freq: Once | INTRAMUSCULAR | Status: AC
Start: 2024-01-13 — End: 2024-01-13
  Administered 2024-01-13: 50 mg via INTRAVENOUS
  Filled 2024-01-13: qty 1

## 2024-01-13 MED ORDER — SODIUM CHLORIDE 0.9 % IV SOLN
213.2000 mg | Freq: Once | INTRAVENOUS | Status: AC
  Administered 2024-01-13: 210 mg via INTRAVENOUS
  Filled 2024-01-13: qty 21

## 2024-01-13 MED ORDER — HEPARIN SOD (PORK) LOCK FLUSH 100 UNIT/ML IV SOLN
500.0000 [IU] | Freq: Once | INTRAVENOUS | Status: DC | PRN
  Filled 2024-01-13: qty 5

## 2024-01-13 MED ORDER — DEXAMETHASONE SODIUM PHOSPHATE 10 MG/ML IJ SOLN
10.0000 mg | Freq: Once | INTRAMUSCULAR | Status: AC
  Administered 2024-01-13: 10 mg via INTRAVENOUS
  Filled 2024-01-13: qty 1

## 2024-01-13 MED ORDER — PALONOSETRON HCL INJECTION 0.25 MG/5ML
0.2500 mg | Freq: Once | INTRAVENOUS | Status: AC
  Administered 2024-01-13: 0.25 mg via INTRAVENOUS
  Filled 2024-01-13: qty 5

## 2024-01-13 MED ORDER — SODIUM CHLORIDE 0.9 % IV SOLN
45.0000 mg/m2 | Freq: Once | INTRAVENOUS | Status: AC
  Administered 2024-01-13: 90 mg via INTRAVENOUS
  Filled 2024-01-13: qty 15

## 2024-01-13 MED ORDER — SODIUM CHLORIDE 0.9 % IV SOLN
INTRAVENOUS | Status: DC
Start: 2024-01-13 — End: 2024-01-13
  Filled 2024-01-13: qty 250

## 2024-01-13 MED ORDER — FAMOTIDINE IN NACL 20-0.9 MG/50ML-% IV SOLN
20.0000 mg | Freq: Once | INTRAVENOUS | Status: AC
  Administered 2024-01-13: 20 mg via INTRAVENOUS
  Filled 2024-01-13: qty 50

## 2024-01-13 NOTE — Patient Instructions (Signed)
 [facility] - A DEPT OF [facility]  Discharge Instructions: Thank you for choosing [facility] to provide your oncology and hematology care.  If you have a lab appointment with the Cancer Center, please go directly to the Cancer Center and check in at the registration area.  Wear comfortable clothing and clothing appropriate for easy access to any Portacath or PICC line.   We strive to give you quality time with your provider. You may need to reschedule your appointment if you arrive late (15 or more minutes).  Arriving late affects you and other patients whose appointments are after yours.  Also, if you miss three or more appointments without notifying the office, you may be dismissed from the clinic at the provider's discretion.      For prescription refill requests, have your pharmacy contact our office and allow 72 hours for refills to be completed.    Today you received the following chemotherapy and/or immunotherapy agents Taxol & Carboplatin      To help prevent nausea and vomiting after your treatment, we encourage you to take your nausea medication as directed.  BELOW ARE SYMPTOMS THAT SHOULD BE REPORTED IMMEDIATELY: *FEVER GREATER THAN 100.4 F (38 C) OR HIGHER *CHILLS OR SWEATING *NAUSEA AND VOMITING THAT IS NOT CONTROLLED WITH YOUR NAUSEA MEDICATION *UNUSUAL SHORTNESS OF BREATH *UNUSUAL BRUISING OR BLEEDING *URINARY PROBLEMS (pain or burning when urinating, or frequent urination) *BOWEL PROBLEMS (unusual diarrhea, constipation, pain near the anus) TENDERNESS IN MOUTH AND THROAT WITH OR WITHOUT PRESENCE OF ULCERS (sore throat, sores in mouth, or a toothache) UNUSUAL RASH, SWELLING OR PAIN  UNUSUAL VAGINAL DISCHARGE OR ITCHING   Items with * indicate a potential emergency and should be followed up as soon as possible or go to the Emergency Department if any problems should occur.  Please show the CHEMOTHERAPY ALERT CARD or  IMMUNOTHERAPY ALERT CARD at check-in to the Emergency Department and triage nurse.  Should you have questions after your visit or need to cancel or reschedule your appointment, please contact [facility] - A DEPT OF [facility]  [phone] and follow the prompts.  Office hours are 8:00 a.m. to 4:30 p.m. Monday - Friday. Please note that voicemails left after 4:00 p.m. may not be returned until the following business day.  We are closed weekends and major holidays. You have access to a nurse at all times for urgent questions. Please call the main number to the clinic [phone] and follow the prompts.  For any non-urgent questions, you may also contact your provider using MyChart. We now offer e-Visits for anyone 67 and older to request care online for non-urgent symptoms. For details visit [email or web address].   Also download the MyChart app! Go to the app store, search "MyChart", open the app, select [facility], and log in with your MyChart username and password.

## 2024-01-13 NOTE — Progress Notes (Unsigned)
 Patient is having a little issue with urination. Pain where they put the chemo in.

## 2024-01-13 NOTE — Progress Notes (Unsigned)
 [facility] CONSULT NOTE  Patient Care Team: Gildardo Pounds, PA as PCP - General (Physician Assistant) Debbe Odea, MD as PCP - Cardiology (Cardiology) Salena Saner, MD as Consulting Physician (Pulmonary Disease) Glory Buff, RN as Oncology Nurse Navigator Michaelyn Barter, MD as Consulting Physician (Oncology) Carmina Miller, MD as Consulting Physician (Radiation Oncology)  CANCER STAGING   Cancer Staging  Stage IV adenocarcinoma of lung [facility]) Staging form: Lung, AJCC 8th Edition - Clinical: Stage IV (cT4, cN3, cM1) - Signed by Michaelyn Barter, MD on 05/20/2023 Stage prefix: Initial diagnosis  ASSESSMENT & PLAN:  Austin Patterson 68 y.o. 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 Pathology showed poorly differentiated carcinoma with spindle cell component. Tumor cells positive for CK and focal TTF-1. The differential diagnosis includes pleomorphic carcinoma and carcinosarcoma. Definitive classification cannot be performed on the biopsy specimen.  - Tempus testing showed PD-L1 0% with no targetable mutations.  - Proceeded with systemic treatment with concern for involvement of upper abdominal lymph nodes and pulmonary metastasis.  s/p dose reduced carboplatin, Taxol and Keytruda on 06/01/2023 x 4 cycles.  Poor functional status/ poor tolerance with two hospitalizations for sepsis/ and second for weakness.  - CT  imaging is unfortunately showing disease progression.  Increase in size of right middle lobe lung mass from 4.8 x 3 cm to 5.2 x 4.9 cm.  Left lower lobe nodule previously 0.8 x 0.8 to 3.5 x 2.2 cm.  Posterior right lung base nodule 1.8 x 1.7.  Previously 1.9 x 1.1 cm.  Stable appearance of mediastinal and right hilar lymph nodes.  This was followed by PET scan and another CTA chest when he was admitted in the hospital for hemoptysis.  In the span of 4 weeks, the lung lesions continue to grow.  - [id number] from Feb 2025 - no targetable mutations.  - Plan is for concurrent chemo RT to progressed lung lesions. Dr. Rushie Chestnut planning for 20 fractions.  # Encounter for chemotherapy - Labs reviewed and acceptable for treatment.   - Proceed with cycle 2 of weekly carbo-taxol chemotherapy   # Chronic respiratory failure on 2 L oxygen with Asthma and COPD as well as idiopathic pulmonary fibrosis - managed by pulmonary - on dupixent  # Hypocalcemia -Vitamin D level is normal -Continue with calcium vitamin D supplements.  # Hyponatremia -Chronic in nature.  Sodium 32. Stable.   # Iron deficiency anemia -Completed IV Venofer x 5 doses.  Hemoglobin had improved to 12.  - Iron panel has normalized  # Cancer related pain -In mid chest.  Continue with oxycodone 5 mg Q8 as needed.   #CAD - on aspirin, plavix  # IV access-port removed on 06/14/23 due to sepsis.   -Inserted on left side on 07/30/23.  No orders of the defined types were placed in this encounter.  Disposition:  Treatment today Follow up per IS- la  The total time spent in the appointment was 20 minutes encounter with patients including review of chart and various  tests results, discussions about plan of care and coordination of care plan   All questions were answered. The patient knows to call the clinic with any problems, questions or concerns. No barriers to learning was detected.  Alinda Dooms, NP 01/13/2024   HISTORY  OF PRESENTING ILLNESS:  Austin Patterson 68 y.o. 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  Cannot rule out sarcomatoid component.  He is a remote smoker quit in 2016.  Prior smoked 2 packs a day since age 5.  He is on 2 L oxygen since 2017.  Lives alone.  Was in a wheelchair.  Does not have family close by.  Follows with Dr. Jayme Cloud.   Interval history Patient seen today as follow-up for labs and cycle 1 of weekly CarboTaxol. Was recently admitted at the hospital for an episode of hemoptysis and COPD exacerbation.  His breathing is improved today.  He is down to his baseline oxygen need of 2 L.  Started radiation on Tuesday.   I have reviewed his chart and materials related to his cancer extensively and collaborated history with the patient. Summary of oncologic history is as follows:  Oncology History  Stage IV adenocarcinoma of lung (HCC)  05/18/2023 Initial Diagnosis   Sarcomatoid carcinoma of lung (HCC)   05/20/2023 Cancer Staging   Staging form: Lung, AJCC 8th Edition - Clinical: Stage IV (cT4, cN3, cM1) - Signed by Michaelyn Barter, MD on 05/20/2023 Stage prefix: Initial diagnosis   06/01/2023 - 11/08/2023 Chemotherapy   Patient is on Treatment Plan : LUNG NSCLC Carboplatin (6) + Paclitaxel (200) + Pembrolizumab (200) D1 q21d x 4 cycles / Pembrolizumab (200) Maintenance D1 q21d     01/06/2024 -  Chemotherapy   Patient is on Treatment Plan : LUNG Carboplatin + Paclitaxel + XRT q7d       MEDICAL HISTORY:  Past Medical History:  Diagnosis Date   Abdominal pain  12/07/2022   Acute urinary retention 12/07/2022   Chest pain 01/24/2022   COPD (chronic obstructive pulmonary disease) (HCC)    GERD (gastroesophageal reflux disease)    Hemothorax on right 05/11/2023   Hyperlipidemia    Hypertension    Pleuritic pain 05/10/2023   Pneumothorax after biopsy 05/11/2023   Post procedure discomfort 05/13/2023   Pulmonary fibrosis (HCC) 11/2015    SURGICAL HISTORY: Past Surgical History:  Procedure Laterality Date   COLONOSCOPY     CORONARY STENT PLACEMENT     ESOPHAGOGASTRODUODENOSCOPY (EGD) WITH PROPOFOL N/A 09/23/2016   Procedure: ESOPHAGOGASTRODUODENOSCOPY (EGD) WITH PROPOFOL;  Surgeon: Wyline Mood, MD;  Location: [facility] ENDOSCOPY;  Service: Endoscopy;  Laterality: N/A;   IR IMAGING GUIDED PORT INSERTION  05/26/2023   IR IMAGING GUIDED PORT INSERTION  07/30/2023   IR RADIOLOGIST EVAL & MGMT  07/20/2023   KYPHOPLASTY N/A 03/14/2020   Procedure: T7 & T11 KYPHOPLASTY;  Surgeon: Kennedy Bucker, MD;  Location: [facility] ORS;  Service: Orthopedics;  Laterality: N/A;   PORT-A-CATH REMOVAL N/A 06/14/2023   Procedure: REMOVAL PORT-A-CATH;  Surgeon: Sung Amabile, DO;  Location: [facility] ORS;  Service: General;  Laterality: N/A;   SHOULDER ACROMIOPLASTY      SOCIAL HISTORY: Social History  Socioeconomic History   Marital status: Divorced    Spouse name: Not on file   Number of children: Not on file   Years of education: Not on file   Highest education level: Not on file  Occupational History   Not on file  Tobacco Use   Smoking status: Former    Current packs/day: 0.00    Average packs/day: 2.0 packs/day for 50.0 years (100.0 ttl pk-yrs)    Types: Cigarettes    Start date: 10/13/1965    Quit date: 10/14/2015    Years since quitting: 8.2   Smokeless tobacco: Former  Building services engineer status: Never Used  Substance and Sexual Activity   Alcohol use: Yes    Alcohol/week: 56.0 standard drinks of alcohol    Types: 56 Cans of beer per week    Comment: "I sit  around and drink beer, that's all I got to do"   Drug use: No   Sexual activity: Not Currently  Other Topics Concern   Not on file  Social History Narrative   Not on file   Social Drivers of Health   Financial Resource Strain: Not on file  Food Insecurity: No Food Insecurity (12/29/2023)   Hunger Vital Sign    Worried About Running Out of Food in the Last Year: Never true    Ran Out of Food in the Last Year: Never true  Transportation Needs: No Transportation Needs (12/29/2023)   PRAPARE - Administrator, Civil Service (Medical): No    Lack of Transportation (Non-Medical): No  Recent Concern: Transportation Needs - Unmet Transportation Needs (11/29/2023)   PRAPARE - Transportation    Lack of Transportation (Medical): Yes    Lack of Transportation (Non-Medical): Yes  Physical Activity: Not on file  Stress: Not on file  Social Connections: Socially Isolated (12/29/2023)   Social Connection and Isolation Panel [NHANES]    Frequency of Communication with Friends and Family: Three times a week    Frequency of Social Gatherings with Friends and Family: Once a week    Attends Religious Services: Never    Database administrator or Organizations: No    Attends Banker Meetings: Never    Marital Status: Divorced  Catering manager Violence: Not At Risk (12/29/2023)   Humiliation, Afraid, Rape, and Kick questionnaire    Fear of Current or Ex-Partner: No    Emotionally Abused: No    Physically Abused: No    Sexually Abused: No    FAMILY HISTORY: Family History  Problem Relation Age of Onset   Heart disease Mother     ALLERGIES:  is allergic to amoxicillin and tizanidine.  MEDICATIONS:  Current Outpatient Medications  Medication Sig Dispense Refill   acetaminophen (TYLENOL) 500 MG tablet Take 1,000 mg by mouth every 6 (six) hours as needed for moderate pain or headache.     albuterol (VENTOLIN HFA) 108 (90 Base) MCG/ACT inhaler Inhale 2 puffs into the lungs  every 6 (six) hours as needed for wheezing or shortness of breath. 8 g 11   amLODipine (NORVASC) 5 MG tablet Take 1 tablet (5 mg total) by mouth at bedtime. Skip the dose if systolic BP less than 130 mmHg 30 tablet 5   aspirin EC 81 MG tablet Take 1 tablet (81 mg total) by mouth daily. 90 tablet 3   atorvastatin (LIPITOR) 40 MG tablet Take 1 tablet (40 mg total) by mouth at bedtime. 90 tablet 3   CALCIUM 600/VITAMIN D  600-10 MG-MCG TABS Take 1 tablet by mouth 2 (two) times daily.     clopidogrel (PLAVIX) 75 MG tablet Take 1 tablet (75 mg total) by mouth daily. 90 tablet 3   Dupilumab (DUPIXENT) 300 MG/2ML SOPN Inject 300 mg into the skin every 14 (fourteen) days. 12 mL 1   EPINEPHrine 0.3 mg/0.3 mL IJ SOAJ injection Inject 0.3 mg into the muscle as needed for anaphylaxis.     ferrous sulfate 325 (65 FE) MG tablet Take 1 tablet (325 mg total) by mouth 2 (two) times daily with a meal. (Patient taking differently: Take 325 mg by mouth every other day.) 60 tablet 2   gabapentin (NEURONTIN) 300 MG capsule TAKE 1 CAPSULE BY MOUTH THREE TIMES A DAY 90 capsule 1   guaiFENesin-dextromethorphan (ROBITUSSIN DM) 100-10 MG/5ML syrup Take 10 mLs by mouth every 6 (six) hours as needed for cough. 118 mL 0   ipratropium-albuterol (DUONEB) 0.5-2.5 (3) MG/3ML SOLN INHALE 1 VIAL THROUGH NEBULIZER EVERY 6 HOURS (Patient taking differently: Take 3 mLs by nebulization every 6 (six) hours as needed.) 270 mL 2   lidocaine-prilocaine (EMLA) cream Apply to affected area once (Patient taking differently: Apply 1 Application topically as directed. Apply to affected area once) 30 g 3   magnesium oxide (MAG-OX) 400 (241.3 Mg) MG tablet Take 1 tablet (400 mg total) by mouth daily. 30 tablet 0   metoprolol succinate (TOPROL-XL) 100 MG 24 hr tablet Take 1 tablet (100 mg total) by mouth daily. 30 tablet 11   montelukast (SINGULAIR) 10 MG tablet Take 10 mg by mouth daily.     Multiple Vitamin (MULTIVITAMIN WITH MINERALS) TABS tablet  Take 1 tablet by mouth daily. 30 tablet 0   omega-3 acid ethyl esters (LOVAZA) 1 g capsule Take 1 g by mouth daily.     oxyCODONE (OXY IR/ROXICODONE) 5 MG immediate release tablet Take 1 tablet (5 mg total) by mouth every 8 (eight) hours as needed for severe pain (pain score 7-10). 90 tablet 0   pantoprazole (PROTONIX) 40 MG tablet Take 1 tablet (40 mg total) by mouth 2 (two) times daily. 90 tablet 3   potassium chloride SA (KLOR-CON M) 20 MEQ tablet Take 20 mEq by mouth daily.     predniSONE (DELTASONE) 10 MG tablet TAKE 1 TABLET (10 MG TOTAL) BY MOUTH DAILY AS NEEDED. TAKE AS DIRECTED. 30 tablet 1   Spacer/Aero-Holding Chambers (AEROCHAMBER MV) inhaler Use as instructed 1 each 0   No current facility-administered medications for this visit.    REVIEW OF SYSTEMS:   Review of Systems  Constitutional:  Positive for malaise/fatigue and weight loss. Negative for chills and fever.  HENT:  Negative for hearing loss, nosebleeds, sore throat and tinnitus.   Eyes:  Negative for blurred vision and double vision.  Respiratory:  Positive for cough and shortness of breath. Negative for hemoptysis and wheezing.   Cardiovascular:  Negative for chest pain, palpitations and leg swelling.  Gastrointestinal:  Negative for abdominal pain, blood in stool, constipation, diarrhea, melena, nausea and vomiting.  Genitourinary:  Negative for dysuria and urgency.  Musculoskeletal:  Negative for back pain, falls, joint pain and myalgias.  Skin:  Negative for itching and rash.  Neurological:  Negative for dizziness, tingling, sensory change, loss of consciousness, weakness and headaches.  Endo/Heme/Allergies:  Negative for environmental allergies. Does not bruise/bleed easily.  Psychiatric/Behavioral:  Negative for depression. The patient is not nervous/anxious and does not have insomnia.    PHYSICAL EXAMINATION: ECOG PERFORMANCE STATUS: 3  Vitals:   01/13/24 0920  BP: 130/88  Pulse: 76  Resp: 14  Temp: (!) 97.3  F (36.3 C)  SpO2: 99%   Filed Weights   01/13/24 0920  Weight: 185 lb (83.9 kg)   General: Weight loss compared to previous exams. More fatigued appearing. On oxygen. Eyes: Pink conjunctiva, anicteric sclera. Lungs: No audible wheezing or coughing Heart: Regular rate and rhythm.  Abdomen: Soft, nontender, nondistended.  Musculoskeletal: No edema, cyanosis, or clubbing. Wheelchair.  Neuro: Alert, answering all questions appropriately. Cranial nerves grossly intact. Skin: No rashes or petechiae noted. Psych: Normal affect.   LABORATORY DATA:  I have reviewed the data as listed Lab Results  Component Value Date   WBC 10.1 01/06/2024   HGB 11.5 (L) 01/06/2024   HCT 35.2 (L) 01/06/2024   MCV 97.2 01/06/2024   PLT 245 01/06/2024   Recent Labs    06/16/23 0824 06/17/23 0329 11/29/23 1307 12/29/23 0026 12/30/23 0511 12/31/23 0457 01/06/24 0829  NA 133*   < > 129*   < > 134* 135 129*  K 3.5   < > 4.2   < > 4.1 4.0 4.0  CL 98   < > 91*   < > 97* 96* 91*  CO2 28   < > 26   < > 28 30 28   GLUCOSE 152*   < > 103*   < > 173* 117* 113*  BUN 10   < > 14   < > 14 20 16   CREATININE 0.55*   < > 0.73   < > 0.58* 0.60* 0.64  CALCIUM 7.0*   < > 8.8*   < > 8.7* 9.2 9.0  GFRNONAA >60   < > >60   < > >60 >60 >60  PROT 6.5   < > 7.2  --  6.8  --  7.1  ALBUMIN 2.9*   < > 3.7  --  3.8  --  4.1  AST 18   < > 22  --  18  --  23  ALT 25   < > 17  --  16  --  18  ALKPHOS 87   < > 89  --  49  --  47  BILITOT 0.3   < > 0.6  --  0.6  --  0.8  BILIDIR <0.1  --   --   --   --   --   --   IBILI NOT CALCULATED  --   --   --   --   --   --    < > = values in this interval not displayed.    RADIOGRAPHIC STUDIES: I have personally reviewed the radiological images as listed and agreed with the findings in the report. CT Angio Chest PE W and/or Wo Contrast Result Date: 12/29/2023 CLINICAL DATA:  hemoptysis, hx lung CA EXAM: CT ANGIOGRAPHY CHEST WITH CONTRAST TECHNIQUE: Multidetector CT imaging of  the chest was performed using the standard protocol during bolus administration of intravenous contrast. Multiplanar CT image reconstructions and MIPs were obtained to evaluate the vascular anatomy. RADIATION DOSE REDUCTION: This exam was performed according to the departmental dose-optimization program which includes automated exposure control, adjustment of the mA and/or kV according to patient size and/or use of iterative reconstruction technique. CONTRAST:  75mL OMNIPAQUE IOHEXOL 350 MG/ML SOLN COMPARISON:  CT chest 11/26/2023 FINDINGS: Cardiovascular: Left chest wall dialysis catheter with tip at the distal superior vena cava. Satisfactory opacification of the pulmonary arteries  to the segmental level. No evidence of pulmonary embolism. Normal heart size. No significant pericardial effusion. The thoracic aorta is normal in caliber. Severe atherosclerotic plaque of the thoracic aorta. Four-vessel coronary artery calcifications. Mediastinum/Nodes: Prominent mediastinal lymph nodes. No enlarged mediastinal, hilar, or axillary lymph nodes. Thyroid gland, trachea, and esophagus demonstrate no significant findings. Lungs/Pleura: Severe paraseptal and centrilobular emphysematous changes. No focal consolidation. No pulmonary nodule. Interval increase in size of a lobulated right middle lobe pulmonary mass measuring 6.5 x 6.4 cm (from 4.8 x 5.2 cm). Interval increase in size of the left lower lobe pulmonary nodule. Interval increase in size of right lower lobe pulmonary nodules. No pleural effusion. No pneumothorax. Upper Abdomen: Stable bilateral adrenal gland nodules. Musculoskeletal: No chest wall abnormality. No suspicious lytic or blastic osseous lesions. No acute displaced fracture. Kyphoplasty of the thoracic spine. Review of the MIP images confirms the above findings. IMPRESSION: 1. No pulmonary embolus. 2. Interval increase in size of right middle lobe and left lower lobe pulmonary masses as well as right lower  lobe pulmonary nodules in a patient with known lung cancer. 3. Stable bilateral adrenal gland nodules-consistent with known adrenal adenoma. 4.  Aortic Atherosclerosis (ICD10-I70.0). Electronically Signed   By: Tish Frederickson M.D.   On: 12/29/2023 02:42   DG Chest 2 View Result Date: 12/29/2023 CLINICAL DATA:  Cough, lung cancer, hemoptysis EXAM: CHEST - 2 VIEW COMPARISON:  CT 11/26/2023 FINDINGS: Lobular mass within the medial, basilar right middle lobe is again identified in keeping with the patient's known primary pulmonary malignancy. Large bleb again noted adjacent to the mass. No superimposed confluent pulmonary infiltrate. No pneumothorax or pleural effusion. Left internal jugular chest port tip seen expected superior vena cava. Cardiac size within normal limits. No acute bone abnormality. Mild artifact related to the patient's wheelchair IMPRESSION: 1. Right middle lobe mass in keeping with the patient's known primary pulmonary malignancy. No superimposed acute cardiopulmonary disease. Electronically Signed   By: Helyn Numbers M.D.   On: 12/29/2023 01:58   NM PET Image Restag (PS) Skull Base To Thigh Result Date: 12/16/2023 CLINICAL DATA:  Subsequent treatment strategy for non-small cell lung cancer. EXAM: NUCLEAR MEDICINE PET SKULL BASE TO THIGH TECHNIQUE: 10.4 mCi F-18 FDG was injected intravenously. Full-ring PET imaging was performed from the skull base to thigh after the radiotracer. CT data was obtained and used for attenuation correction and anatomic localization. Fasting blood glucose: 101 mg/dl COMPARISON:  CT chest abdomen pelvis 11/26/2023 and PET 04/19/2023. FINDINGS: Mediastinal blood pool activity: SUV max 3.0 Liver activity: SUV max NA NECK: No abnormal hypermetabolism. Incidental CT findings: None. CHEST: Low right internal jugular lymph node measures 7 mm, SUV max 2.8. Hypermetabolic mediastinal and bihilar lymph nodes. Index low right paratracheal lymph node measures 1.4 cm (6/53),  SUV max 6.0, stable. 5.2 x 6.2 cm right middle lobe mass, SUV max 14.4, previously 17.9. Left lower lobe mass is new, measuring 3.5 x 4.8 cm (6/78), SUV max 13.3. 1.5 cm subpleural posterolateral right lower lobe nodule (6/76), similar, SUV max 2.6. No additional abnormal hypermetabolism. Incidental CT findings: Left IJ Port-A-Cath terminates in the low SVC. Atherosclerotic calcification of the aorta, aortic valve and coronary arteries. Heart is at the upper limits of normal in size. No pericardial or pleural effusion. Centrilobular and paraseptal emphysema. ABDOMEN/PELVIS: Periportal hypermetabolic adenopathy with index lymph node measuring 17 mm (6/89), SUV max 5.4, similar. No additional abnormal hypermetabolism. Incidental CT findings: 2.4 cm right adrenal nodule measures -5 Hounsfield units and  1.7 cm left adrenal nodule, 4 Hounsfield units. No specific follow-up necessary. Scarring in the right kidney. Gastric wall thickening. SKELETON: No abnormal hypermetabolism. Incidental CT findings: Degenerative changes in the spine. Lower thoracic vertebral body augmentation. IMPRESSION: 1. Disease progression as evidenced by hypermetabolic nodules and masses in the lungs bilaterally as well as hypermetabolic metastatic mediastinal, hilar and periportal lymph nodes. 2. Bilateral adrenal adenomas. 3. Gastric wall thickening. 4. Aortic atherosclerosis (ICD10-I70.0). Coronary artery calcification. 5.  Emphysema (ICD10-J43.9). Electronically Signed   By: Leanna Battles M.D.   On: 12/16/2023 15:55

## 2024-01-14 ENCOUNTER — Other Ambulatory Visit: Payer: Self-pay

## 2024-01-14 ENCOUNTER — Ambulatory Visit
Admission: RE | Admit: 2024-01-14 | Discharge: 2024-01-14 | Disposition: A | Discharge status: Home / Self Care | Source: Ambulatory Visit | Attending: Radiation Oncology | Admitting: Radiation Oncology

## 2024-01-14 ENCOUNTER — Encounter: Payer: Self-pay | Admitting: Internal Medicine

## 2024-01-14 DIAGNOSIS — Z510 Encounter for antineoplastic radiation therapy: Secondary | ICD-10-CM | POA: Diagnosis not present

## 2024-01-14 LAB — RAD ONC ARIA SESSION SUMMARY
Course Elapsed Days: 10
Plan Fractions Treated to Date: 9
Plan Prescribed Dose Per Fraction: 2 Gy
Plan Total Fractions Prescribed: 15
Plan Total Prescribed Dose: 30 Gy
Reference Point Dosage Given to Date: 18 Gy
Reference Point Session Dosage Given: 2 Gy
Session Number: 9

## 2024-01-17 ENCOUNTER — Ambulatory Visit
Admission: RE | Admit: 2024-01-17 | Discharge: 2024-01-17 | Disposition: A | Discharge status: Home / Self Care | Source: Ambulatory Visit | Attending: Radiation Oncology | Admitting: Radiation Oncology

## 2024-01-17 ENCOUNTER — Other Ambulatory Visit: Payer: Self-pay | Admitting: *Deleted

## 2024-01-17 ENCOUNTER — Other Ambulatory Visit: Payer: Self-pay

## 2024-01-17 DIAGNOSIS — Z510 Encounter for antineoplastic radiation therapy: Secondary | ICD-10-CM | POA: Diagnosis not present

## 2024-01-17 LAB — RAD ONC ARIA SESSION SUMMARY
Course Elapsed Days: 13
Plan Fractions Treated to Date: 10
Plan Prescribed Dose Per Fraction: 2 Gy
Plan Total Fractions Prescribed: 15
Plan Total Prescribed Dose: 30 Gy
Reference Point Dosage Given to Date: 20 Gy
Reference Point Session Dosage Given: 2 Gy
Session Number: 10

## 2024-01-17 MED ORDER — SUCRALFATE 1 G PO TABS: 1.0000 g | ORAL_TABLET | Freq: Three times a day (TID) | ORAL | 4 refills | Status: DC

## 2024-01-18 ENCOUNTER — Other Ambulatory Visit: Payer: Self-pay

## 2024-01-18 ENCOUNTER — Ambulatory Visit
Admission: RE | Admit: 2024-01-18 | Discharge: 2024-01-18 | Disposition: A | Discharge status: Home / Self Care | Source: Ambulatory Visit | Attending: Radiation Oncology | Admitting: Radiation Oncology

## 2024-01-18 DIAGNOSIS — C3491 Malignant neoplasm of unspecified part of right bronchus or lung: Secondary | ICD-10-CM | POA: Insufficient documentation

## 2024-01-18 DIAGNOSIS — D381 Neoplasm of uncertain behavior of trachea, bronchus and lung: Secondary | ICD-10-CM | POA: Insufficient documentation

## 2024-01-18 DIAGNOSIS — Z510 Encounter for antineoplastic radiation therapy: Secondary | ICD-10-CM | POA: Diagnosis present

## 2024-01-18 LAB — RAD ONC ARIA SESSION SUMMARY
Course Elapsed Days: 14
Plan Fractions Treated to Date: 11
Plan Prescribed Dose Per Fraction: 2 Gy
Plan Total Fractions Prescribed: 15
Plan Total Prescribed Dose: 30 Gy
Reference Point Dosage Given to Date: 22 Gy
Reference Point Session Dosage Given: 2 Gy
Session Number: 11

## 2024-01-19 ENCOUNTER — Other Ambulatory Visit: Payer: Self-pay

## 2024-01-19 ENCOUNTER — Ambulatory Visit
Admission: RE | Admit: 2024-01-19 | Discharge: 2024-01-19 | Disposition: A | Discharge status: Home / Self Care | Source: Ambulatory Visit | Attending: Radiation Oncology | Admitting: Radiation Oncology

## 2024-01-19 ENCOUNTER — Other Ambulatory Visit: Payer: Self-pay | Admitting: Pulmonary Disease

## 2024-01-19 DIAGNOSIS — Z510 Encounter for antineoplastic radiation therapy: Secondary | ICD-10-CM | POA: Diagnosis not present

## 2024-01-19 LAB — RAD ONC ARIA SESSION SUMMARY
Course Elapsed Days: 15
Plan Fractions Treated to Date: 12
Plan Prescribed Dose Per Fraction: 2 Gy
Plan Total Fractions Prescribed: 15
Plan Total Prescribed Dose: 30 Gy
Reference Point Dosage Given to Date: 24 Gy
Reference Point Session Dosage Given: 2 Gy
Session Number: 12

## 2024-01-19 NOTE — Telephone Encounter (Signed)
 [facility] to refill

## 2024-01-20 ENCOUNTER — Inpatient Hospital Stay: Attending: Internal Medicine

## 2024-01-20 ENCOUNTER — Inpatient Hospital Stay

## 2024-01-20 ENCOUNTER — Encounter: Payer: Self-pay | Admitting: Internal Medicine

## 2024-01-20 ENCOUNTER — Inpatient Hospital Stay: Admitting: Internal Medicine

## 2024-01-20 ENCOUNTER — Ambulatory Visit
Admission: RE | Admit: 2024-01-20 | Discharge: 2024-01-20 | Disposition: A | Discharge status: Home / Self Care | Source: Ambulatory Visit | Attending: Radiation Oncology | Admitting: Radiation Oncology

## 2024-01-20 ENCOUNTER — Other Ambulatory Visit: Payer: Self-pay

## 2024-01-20 VITALS — BP 130/71 | HR 82 | Temp 97.6°F | Resp 19 | Wt 188.7 lb

## 2024-01-20 DIAGNOSIS — G893 Neoplasm related pain (acute) (chronic): Secondary | ICD-10-CM | POA: Insufficient documentation

## 2024-01-20 DIAGNOSIS — Z510 Encounter for antineoplastic radiation therapy: Secondary | ICD-10-CM | POA: Diagnosis not present

## 2024-01-20 DIAGNOSIS — Z7902 Long term (current) use of antithrombotics/antiplatelets: Secondary | ICD-10-CM | POA: Diagnosis not present

## 2024-01-20 DIAGNOSIS — Z79899 Other long term (current) drug therapy: Secondary | ICD-10-CM | POA: Insufficient documentation

## 2024-01-20 DIAGNOSIS — I2510 Atherosclerotic heart disease of native coronary artery without angina pectoris: Secondary | ICD-10-CM | POA: Insufficient documentation

## 2024-01-20 DIAGNOSIS — E871 Hypo-osmolality and hyponatremia: Secondary | ICD-10-CM | POA: Insufficient documentation

## 2024-01-20 DIAGNOSIS — C3491 Malignant neoplasm of unspecified part of right bronchus or lung: Secondary | ICD-10-CM

## 2024-01-20 DIAGNOSIS — Z5111 Encounter for antineoplastic chemotherapy: Secondary | ICD-10-CM | POA: Insufficient documentation

## 2024-01-20 DIAGNOSIS — Z7982 Long term (current) use of aspirin: Secondary | ICD-10-CM | POA: Insufficient documentation

## 2024-01-20 DIAGNOSIS — Z87891 Personal history of nicotine dependence: Secondary | ICD-10-CM | POA: Insufficient documentation

## 2024-01-20 DIAGNOSIS — D649 Anemia, unspecified: Secondary | ICD-10-CM | POA: Diagnosis not present

## 2024-01-20 LAB — RAD ONC ARIA SESSION SUMMARY
Course Elapsed Days: 16
Plan Fractions Treated to Date: 13
Plan Prescribed Dose Per Fraction: 2 Gy
Plan Total Fractions Prescribed: 15
Plan Total Prescribed Dose: 30 Gy
Reference Point Dosage Given to Date: 26 Gy
Reference Point Session Dosage Given: 2 Gy
Session Number: 13

## 2024-01-20 LAB — CBC WITH DIFFERENTIAL (CANCER CENTER ONLY)
Abs Immature Granulocytes: 0.02 10*3/uL (ref 0.00–0.07)
Basophils Absolute: 0 10*3/uL (ref 0.0–0.1)
Basophils Relative: 0 %
Eosinophils Absolute: 0.1 10*3/uL (ref 0.0–0.5)
Eosinophils Relative: 2 %
HCT: 32.5 % — ABNORMAL LOW (ref 39.0–52.0)
Hemoglobin: 10.6 g/dL — ABNORMAL LOW (ref 13.0–17.0)
Immature Granulocytes: 1 %
Lymphocytes Relative: 15 %
Lymphs Abs: 0.6 10*3/uL — ABNORMAL LOW (ref 0.7–4.0)
MCH: 31.6 pg (ref 26.0–34.0)
MCHC: 32.6 g/dL (ref 30.0–36.0)
MCV: 97 fL (ref 80.0–100.0)
Monocytes Absolute: 0.3 10*3/uL (ref 0.1–1.0)
Monocytes Relative: 8 %
Neutro Abs: 2.8 10*3/uL (ref 1.7–7.7)
Neutrophils Relative %: 74 %
Platelet Count: 222 10*3/uL (ref 150–400)
RBC: 3.35 MIL/uL — ABNORMAL LOW (ref 4.22–5.81)
RDW: 14.3 % (ref 11.5–15.5)
WBC Count: 3.7 10*3/uL — ABNORMAL LOW (ref 4.0–10.5)
nRBC: 0 % (ref 0.0–0.2)

## 2024-01-20 LAB — CMP (CANCER CENTER ONLY)
ALT: 22 U/L (ref 0–44)
AST: 25 U/L (ref 15–41)
Albumin: 4 g/dL (ref 3.5–5.0)
Alkaline Phosphatase: 43 U/L (ref 38–126)
Anion gap: 9 (ref 5–15)
BUN: 12 mg/dL (ref 8–23)
CO2: 28 mmol/L (ref 22–32)
Calcium: 8.8 mg/dL — ABNORMAL LOW (ref 8.9–10.3)
Chloride: 97 mmol/L — ABNORMAL LOW (ref 98–111)
Creatinine: 0.7 mg/dL (ref 0.61–1.24)
GFR, Estimated: >60 mL/min (ref >60)
Glucose, Bld: 137 mg/dL — ABNORMAL HIGH (ref 70–99)
Potassium: 3.5 mmol/L (ref 3.5–5.1)
Sodium: 134 mmol/L — ABNORMAL LOW (ref 135–145)
Total Bilirubin: 0.6 mg/dL (ref 0.0–1.2)
Total Protein: 6.6 g/dL (ref 6.5–8.1)

## 2024-01-20 LAB — IRON AND TIBC
Iron: 78 ug/dL (ref 45–182)
Saturation Ratios: 21 % (ref 17.9–39.5)
TIBC: 371 ug/dL (ref 250–450)
UIBC: 293 ug/dL

## 2024-01-20 LAB — FERRITIN: Ferritin: 158 ng/mL (ref 24–336)

## 2024-01-20 MED ORDER — PALONOSETRON HCL INJECTION 0.25 MG/5ML
0.2500 mg | Freq: Once | INTRAVENOUS | Status: AC
  Administered 2024-01-20: 0.25 mg via INTRAVENOUS
  Filled 2024-01-20: qty 5

## 2024-01-20 MED ORDER — HEPARIN SOD (PORK) LOCK FLUSH 100 UNIT/ML IV SOLN
500.0000 [IU] | Freq: Once | INTRAVENOUS | Status: AC | PRN
  Administered 2024-01-20: 500 [IU]
  Filled 2024-01-20: qty 5

## 2024-01-20 MED ORDER — FAMOTIDINE IN NACL 20-0.9 MG/50ML-% IV SOLN
20.0000 mg | Freq: Once | INTRAVENOUS | Status: AC
Start: 2024-01-20 — End: 2024-01-20
  Administered 2024-01-20: 20 mg via INTRAVENOUS
  Filled 2024-01-20: qty 50

## 2024-01-20 MED ORDER — SODIUM CHLORIDE 0.9% FLUSH
10.0000 mL | INTRAVENOUS | Status: DC | PRN
  Administered 2024-01-20: 10 mL
  Filled 2024-01-20: qty 10

## 2024-01-20 MED ORDER — SODIUM CHLORIDE 0.9 % IV SOLN
INTRAVENOUS | Status: DC
  Filled 2024-01-20: qty 250

## 2024-01-20 MED ORDER — SODIUM CHLORIDE 0.9 % IV SOLN
213.2000 mg | Freq: Once | INTRAVENOUS | Status: AC
  Administered 2024-01-20: 210 mg via INTRAVENOUS
  Filled 2024-01-20: qty 21

## 2024-01-20 MED ORDER — SODIUM CHLORIDE 0.9 % IV SOLN
45.0000 mg/m2 | Freq: Once | INTRAVENOUS | Status: AC
  Administered 2024-01-20: 90 mg via INTRAVENOUS
  Filled 2024-01-20: qty 15

## 2024-01-20 MED ORDER — DEXAMETHASONE SODIUM PHOSPHATE 10 MG/ML IJ SOLN
10.0000 mg | Freq: Once | INTRAMUSCULAR | Status: AC
  Administered 2024-01-20: 10 mg via INTRAVENOUS
  Filled 2024-01-20: qty 1

## 2024-01-20 MED ORDER — CETIRIZINE HCL 10 MG/ML IV SOLN
10.0000 mg | Freq: Once | INTRAVENOUS | Status: AC
Start: 2024-01-20 — End: 2024-01-20
  Administered 2024-01-20: 10 mg via INTRAVENOUS
  Filled 2024-01-20: qty 1

## 2024-01-21 ENCOUNTER — Other Ambulatory Visit: Payer: Self-pay

## 2024-01-21 ENCOUNTER — Ambulatory Visit
Admission: RE | Admit: 2024-01-21 | Discharge: 2024-01-21 | Disposition: A | Discharge status: Home / Self Care | Source: Ambulatory Visit | Attending: Radiation Oncology | Admitting: Radiation Oncology

## 2024-01-21 DIAGNOSIS — Z510 Encounter for antineoplastic radiation therapy: Secondary | ICD-10-CM | POA: Diagnosis not present

## 2024-01-21 LAB — RAD ONC ARIA SESSION SUMMARY
Course Elapsed Days: 17
Plan Fractions Treated to Date: 14
Plan Prescribed Dose Per Fraction: 2 Gy
Plan Total Fractions Prescribed: 15
Plan Total Prescribed Dose: 30 Gy
Reference Point Dosage Given to Date: 28 Gy
Reference Point Session Dosage Given: 2 Gy
Session Number: 14

## 2024-01-24 ENCOUNTER — Ambulatory Visit
Admission: RE | Admit: 2024-01-24 | Discharge: 2024-01-24 | Disposition: A | Discharge status: Home / Self Care | Source: Ambulatory Visit | Attending: Radiation Oncology | Admitting: Radiation Oncology

## 2024-01-24 ENCOUNTER — Other Ambulatory Visit: Payer: Self-pay

## 2024-01-24 DIAGNOSIS — Z510 Encounter for antineoplastic radiation therapy: Secondary | ICD-10-CM | POA: Diagnosis not present

## 2024-01-24 LAB — RAD ONC ARIA SESSION SUMMARY
Course Elapsed Days: 20
Plan Fractions Treated to Date: 15
Plan Prescribed Dose Per Fraction: 2 Gy
Plan Total Fractions Prescribed: 15
Plan Total Prescribed Dose: 30 Gy
Reference Point Dosage Given to Date: 30 Gy
Reference Point Session Dosage Given: 2 Gy
Session Number: 15

## 2024-01-25 ENCOUNTER — Ambulatory Visit

## 2024-01-25 NOTE — Radiation Completion Notes (Signed)
 Patient Name: Austin Patterson, Austin Patterson MRN: [id number] Date of Birth: [birth year] Referring Physician: Unknown Foley, M.D. Date of Service: 2024-01-25 Radiation Oncologist: Carmina Miller, M.D. 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  He has multiple comorbidities including hypertension CAD COPD chronic respiratory failure on 2 L of nasal cannula idiopathic pulmonary fibrosis history of hypersensitivity pneumonitis.  He has multiple lung masses and is undergone biopsy by Dr. Jayme Cloud showing poorly differentiated carcinoma with spindle cell component.  He had [employer] testing show PDL 0% with no target mutations he has had systemic treatment with Taxol Keytruda with poor tolerance.  He has had hospitalizations x 2 for sepsis.  He is recently mated for hemoptysis.  CT scans have shown increase in size of right middle lobe lung mass up to 5.2 cm in greatest dimension.  There is a left lower lobe nodule previously 0.8 cm now 3.5 cm.  He has also on PET scan shows disease progression as evidenced by hypermetabolic masses in bilateral lungs as well as mediastinal and hilar hypermetabolic activity.  He is seen today for consideration for palliative radiation therapy to his chest.  He was seen bedside he is stable at this time.  He has been on blood thinners although the aspirin discontinued while he is in the hospital he has really had no episodes of hemoptysis since admitted.  He has no significant cough at this time.      ==========DELIVERED PLANS==========  First Treatment Date: 2024-01-04 Last  Treatment Date: 2024-01-24   Plan Name: Lung_R Site: Lung, Right Technique: 3D Mode: Photon Dose Per Fraction: 2 Gy Prescribed Dose (Delivered / Prescribed): 30 Gy / 30 Gy Prescribed Fxs (Delivered / Prescribed): 15 / 15     ==========ON TREATMENT VISIT DATES========== 2024-01-04, 2024-01-11, 2024-01-17, 2024-01-24     ==========UPCOMING VISITS==========       ==========APPENDIX - ON TREATMENT VISIT NOTES==========   See weekly On Treatment Notes in Epic for details in the Media tab (listed as Progress notes on the On Treatment Visit Dates listed above).

## 2024-01-26 ENCOUNTER — Other Ambulatory Visit: Payer: Self-pay | Admitting: Pulmonary Disease

## 2024-01-26 ENCOUNTER — Ambulatory Visit

## 2024-01-27 ENCOUNTER — Other Ambulatory Visit: Payer: Self-pay

## 2024-01-27 ENCOUNTER — Inpatient Hospital Stay (HOSPITAL_BASED_OUTPATIENT_CLINIC_OR_DEPARTMENT_OTHER): Admitting: Internal Medicine

## 2024-01-27 ENCOUNTER — Inpatient Hospital Stay

## 2024-01-27 ENCOUNTER — Ambulatory Visit

## 2024-01-27 VITALS — BP 117/69 | HR 93 | Temp 97.0°F | Resp 20 | Ht 67.0 in | Wt 188.9 lb

## 2024-01-27 VITALS — BP 125/75 | HR 89

## 2024-01-27 DIAGNOSIS — T451X5A Adverse effect of antineoplastic and immunosuppressive drugs, initial encounter: Secondary | ICD-10-CM

## 2024-01-27 DIAGNOSIS — C3491 Malignant neoplasm of unspecified part of right bronchus or lung: Secondary | ICD-10-CM

## 2024-01-27 DIAGNOSIS — D6481 Anemia due to antineoplastic chemotherapy: Secondary | ICD-10-CM | POA: Diagnosis not present

## 2024-01-27 DIAGNOSIS — Z5111 Encounter for antineoplastic chemotherapy: Secondary | ICD-10-CM

## 2024-01-27 LAB — CMP (CANCER CENTER ONLY)
ALT: 24 U/L (ref 0–44)
AST: 26 U/L (ref 15–41)
Albumin: 3.8 g/dL (ref 3.5–5.0)
Alkaline Phosphatase: 46 U/L (ref 38–126)
Anion gap: 7 (ref 5–15)
BUN: 12 mg/dL (ref 8–23)
CO2: 26 mmol/L (ref 22–32)
Calcium: 8.1 mg/dL — ABNORMAL LOW (ref 8.9–10.3)
Chloride: 98 mmol/L (ref 98–111)
Creatinine: 0.69 mg/dL (ref 0.61–1.24)
GFR, Estimated: >60 mL/min (ref >60)
Glucose, Bld: 116 mg/dL — ABNORMAL HIGH (ref 70–99)
Potassium: 3.9 mmol/L (ref 3.5–5.1)
Sodium: 131 mmol/L — ABNORMAL LOW (ref 135–145)
Total Bilirubin: 0.5 mg/dL (ref 0.0–1.2)
Total Protein: 6.3 g/dL — ABNORMAL LOW (ref 6.5–8.1)

## 2024-01-27 LAB — CBC WITH DIFFERENTIAL (CANCER CENTER ONLY)
Abs Immature Granulocytes: 0.02 10*3/uL (ref 0.00–0.07)
Basophils Absolute: 0 10*3/uL (ref 0.0–0.1)
Basophils Relative: 1 %
Eosinophils Absolute: 0 10*3/uL (ref 0.0–0.5)
Eosinophils Relative: 0 %
HCT: 30.1 % — ABNORMAL LOW (ref 39.0–52.0)
Hemoglobin: 9.9 g/dL — ABNORMAL LOW (ref 13.0–17.0)
Immature Granulocytes: 1 %
Lymphocytes Relative: 27 %
Lymphs Abs: 0.9 10*3/uL (ref 0.7–4.0)
MCH: 31.9 pg (ref 26.0–34.0)
MCHC: 32.9 g/dL (ref 30.0–36.0)
MCV: 97.1 fL (ref 80.0–100.0)
Monocytes Absolute: 0.4 10*3/uL (ref 0.1–1.0)
Monocytes Relative: 11 %
Neutro Abs: 1.9 10*3/uL (ref 1.7–7.7)
Neutrophils Relative %: 60 %
Platelet Count: 166 10*3/uL (ref 150–400)
RBC: 3.1 MIL/uL — ABNORMAL LOW (ref 4.22–5.81)
RDW: 15.3 % (ref 11.5–15.5)
WBC Count: 3.2 10*3/uL — ABNORMAL LOW (ref 4.0–10.5)
nRBC: 0 % (ref 0.0–0.2)

## 2024-01-27 MED ORDER — FAMOTIDINE IN NACL 20-0.9 MG/50ML-% IV SOLN
20.0000 mg | Freq: Once | INTRAVENOUS | Status: AC
  Administered 2024-01-27: 20 mg via INTRAVENOUS
  Filled 2024-01-27: qty 50

## 2024-01-27 MED ORDER — HEPARIN SOD (PORK) LOCK FLUSH 100 UNIT/ML IV SOLN
500.0000 [IU] | Freq: Once | INTRAVENOUS | Status: AC | PRN
  Administered 2024-01-27: 500 [IU]
  Filled 2024-01-27: qty 5

## 2024-01-27 MED ORDER — SODIUM CHLORIDE 0.9 % IV SOLN
INTRAVENOUS | Status: DC
  Filled 2024-01-27: qty 250

## 2024-01-27 MED ORDER — OXYCODONE HCL 5 MG PO TABS
5.0000 mg | ORAL_TABLET | Freq: Three times a day (TID) | ORAL | 0 refills | Status: DC | PRN
Start: 2024-01-27 — End: 2024-03-02

## 2024-01-27 MED ORDER — PALONOSETRON HCL INJECTION 0.25 MG/5ML
0.2500 mg | Freq: Once | INTRAVENOUS | Status: AC
  Administered 2024-01-27: 0.25 mg via INTRAVENOUS
  Filled 2024-01-27: qty 5

## 2024-01-27 MED ORDER — SODIUM CHLORIDE 0.9 % IV SOLN
213.2000 mg | Freq: Once | INTRAVENOUS | Status: AC
  Administered 2024-01-27: 210 mg via INTRAVENOUS
  Filled 2024-01-27: qty 21

## 2024-01-27 MED ORDER — SODIUM CHLORIDE 0.9 % IV SOLN
45.0000 mg/m2 | Freq: Once | INTRAVENOUS | Status: AC
  Administered 2024-01-27: 90 mg via INTRAVENOUS
  Filled 2024-01-27: qty 15

## 2024-01-27 MED ORDER — DEXAMETHASONE SODIUM PHOSPHATE 10 MG/ML IJ SOLN
10.0000 mg | Freq: Once | INTRAMUSCULAR | Status: AC
  Administered 2024-01-27: 10 mg via INTRAVENOUS
  Filled 2024-01-27: qty 1

## 2024-01-27 MED ORDER — CETIRIZINE HCL 10 MG/ML IV SOLN
10.0000 mg | Freq: Once | INTRAVENOUS | Status: AC
  Administered 2024-01-27: 10 mg via INTRAVENOUS
  Filled 2024-01-27: qty 1

## 2024-01-27 NOTE — Patient Instructions (Signed)
 [facility] - A DEPT OF [facility]  Discharge Instructions: Thank you for choosing [facility] to provide your oncology and hematology care.  If you have a lab appointment with the Cancer Center, please go directly to the Cancer Center and check in at the registration area.  Wear comfortable clothing and clothing appropriate for easy access to any Portacath or PICC line.   We strive to give you quality time with your provider. You may need to reschedule your appointment if you arrive late (15 or more minutes).  Arriving late affects you and other patients whose appointments are after yours.  Also, if you miss three or more appointments without notifying the office, you may be dismissed from the clinic at the provider's discretion.      For prescription refill requests, have your pharmacy contact our office and allow 72 hours for refills to be completed.    Today you received the following chemotherapy and/or immunotherapy agents Taxol, Carboplatin      To help prevent nausea and vomiting after your treatment, we encourage you to take your nausea medication as directed.  BELOW ARE SYMPTOMS THAT SHOULD BE REPORTED IMMEDIATELY: *FEVER GREATER THAN 100.4 F (38 C) OR HIGHER *CHILLS OR SWEATING *NAUSEA AND VOMITING THAT IS NOT CONTROLLED WITH YOUR NAUSEA MEDICATION *UNUSUAL SHORTNESS OF BREATH *UNUSUAL BRUISING OR BLEEDING *URINARY PROBLEMS (pain or burning when urinating, or frequent urination) *BOWEL PROBLEMS (unusual diarrhea, constipation, pain near the anus) TENDERNESS IN MOUTH AND THROAT WITH OR WITHOUT PRESENCE OF ULCERS (sore throat, sores in mouth, or a toothache) UNUSUAL RASH, SWELLING OR PAIN  UNUSUAL VAGINAL DISCHARGE OR ITCHING   Items with * indicate a potential emergency and should be followed up as soon as possible or go to the Emergency Department if any problems should occur.  Please show the CHEMOTHERAPY ALERT CARD or  IMMUNOTHERAPY ALERT CARD at check-in to the Emergency Department and triage nurse.  Should you have questions after your visit or need to cancel or reschedule your appointment, please contact [facility] - A DEPT OF [facility]  [phone] and follow the prompts.  Office hours are 8:00 a.m. to 4:30 p.m. Monday - Friday. Please note that voicemails left after 4:00 p.m. may not be returned until the following business day.  We are closed weekends and major holidays. You have access to a nurse at all times for urgent questions. Please call the main number to the clinic [phone] and follow the prompts.  For any non-urgent questions, you may also contact your provider using MyChart. We now offer e-Visits for anyone 4 and older to request care online for non-urgent symptoms. For details visit [email or web address].   Also download the MyChart app! Go to the app store, search "MyChart", open the app, select , and log in with your MyChart username and password.

## 2024-01-27 NOTE — Progress Notes (Signed)
 [facility] CONSULT NOTE  Patient Care Team: Gildardo Pounds, PA as PCP - General (Physician Assistant) Debbe Odea, MD as PCP - Cardiology (Cardiology) Salena Saner, MD as Consulting Physician (Pulmonary Disease) Glory Buff, RN as Oncology Nurse Navigator Michaelyn Barter, MD as Consulting Physician (Oncology) Carmina Miller, MD as Consulting Physician (Radiation Oncology)   CANCER STAGING   Cancer Staging  Stage IV adenocarcinoma of lung [facility]) Staging form: Lung, AJCC 8th Edition - Clinical: Stage IV (cT4, cN3, cM1) - Signed by Michaelyn Barter, MD on 05/20/2023 Stage prefix: Initial diagnosis   ASSESSMENT & PLAN:  Austin Patterson 68 y.o. 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  Pathology showed poorly differentiated carcinoma with spindle cell component.  Tumor cells positive for CK and focal TTF-1.  The differential diagnosis includes pleomorphic carcinoma and carcinosarcoma.  Definitive classification cannot be performed on the biopsy specimen.  -Tempus testing from August 2024 showed PD-L1 0% with no targetable mutations.  -Proceeded with systemic treatment with concern for involvement of upper abdominal lymph nodes and pulmonary metastasis.  s/p dose reduced carboplatin, Taxol and Keytruda on 06/01/2023 x 4 cycles.  Poor functional status/ poor tolerance with two hospitalizations for sepsis/ and second  for weakness.  On Keytruda maintenance until February 2025.  On PET imaging, progression of the disease in the right middle lobe lung mass and left lower lobe nodule.  Bilateral hilar, mediastinal and upper abdominal lymphadenopathy overall stable.  -Started on concurrent chemo RT on 01/05/2024.  Discussed with Dr. Rushie Chestnut and plan was to do 4 weeks of radiation.  Now the plan is to do 6 weeks of radiation.  He has repeat CT SIM scheduled for April 21.  And then plan for 3 more weeks of radiation starting April 28.  Will proceed with cycle 4 of CarboTaxol today.  And then scheduled for 2 more cycles starting May 1.  Then, will consider consolidation with dose reduced carboplatin and gemcitabine 20% dose reduction for 2 cycles.  -[id number] from Feb 2025 - no targetable mutations.  # Chronic respiratory failure on 2 L oxygen # Asthma and COPD overlap # Idiopathic pulmonary fibrosis -Management per pulmonary.  On Dupixent  # Hypocalcemia -Vitamin D level is normal -Continue with calcium vitamin D supplements.  # Hyponatremia -Chronic in nature.  # Normocytic anemia -History of iron deficiency.  Completed IV Venofer x 5 doses.   -Recent iron panel is normal.  Hemoglobin 9.9 likely secondary to chemo RT.  # Cancer related pain -In mid chest.  Continue with oxycodone 5 mg Q8 as needed.   #CAD- on aspirin, plavix  # IV access-port removed on 06/14/23 due to sepsis.   -Inserted on left side on 07/30/23.  Orders Placed This Encounter  Procedures   CBC with Differential (Cancer Center Only)    Standing Status:   Future    Expected Date:   02/17/2024    Expiration Date:   02/16/2025   CMP (Cancer Center only)    Standing Status:  Future    Expected Date:   02/17/2024    Expiration Date:   02/16/2025   CBC with Differential (Cancer Center Only)    Standing Status:   Future    Expected Date:   02/24/2024    Expiration Date:   02/23/2025   CMP (Cancer Center only)    Standing Status:   Future     Expected Date:   02/24/2024    Expiration Date:   02/23/2025   RTC in 3 weeks for MD visit, labs, cycle 5 carbo Taxol.  The total time spent in the appointment was 30 minutes encounter with patients including review of chart and various tests results, discussions about plan of care and coordination of care plan   All questions were answered. The patient knows to call the clinic with any problems, questions or concerns. No barriers to learning was detected.  Michaelyn Barter, MD 4/10/20251:17 PM   HISTORY OF PRESENTING ILLNESS:  Austin Patterson 68 y.o. 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  Cannot rule out sarcomatoid component.  He is a remote smoker quit in 2016.  Prior smoked 2 packs a day since age 33.  He is on 2 L oxygen since 2017.  Lives alone.  Was in a wheelchair.  Does not have family close by.  Follows with Dr. Jayme Cloud.   Interval history Patient was seen today as follow-up prior to cycle 4 of CarboTaxol. He reports esophagitis.  Difficulty with swallowing.  He was started on sucralfate and Protonix.  Does have episodes of shortness of breath from recent allergy season.   I have reviewed his chart and materials related to his cancer extensively and collaborated history with the patient. Summary of oncologic history is as follows:  Oncology History  Stage IV adenocarcinoma of lung (HCC)  05/18/2023 Initial Diagnosis   Sarcomatoid carcinoma of lung (HCC)   05/20/2023 Cancer Staging   Staging form: Lung, AJCC 8th Edition - Clinical: Stage IV (cT4, cN3, cM1) -  Signed by Michaelyn Barter, MD on 05/20/2023 Stage prefix: Initial diagnosis   06/01/2023 - 11/08/2023 Chemotherapy   Patient is on Treatment Plan : LUNG NSCLC Carboplatin (6) + Paclitaxel (200) + Pembrolizumab (200) D1 q21d x 4 cycles / Pembrolizumab (200) Maintenance D1 q21d     01/06/2024 -  Chemotherapy   Patient is on Treatment Plan : LUNG Carboplatin + Paclitaxel + XRT q7d       MEDICAL HISTORY:  Past Medical History:  Diagnosis Date   Abdominal pain 12/07/2022   Acute urinary retention 12/07/2022   Chest pain 01/24/2022   COPD (chronic obstructive pulmonary disease) (HCC)    GERD (gastroesophageal reflux disease)    Hemothorax on right 05/11/2023   Hyperlipidemia    Hypertension    Pleuritic pain 05/10/2023   Pneumothorax after biopsy 05/11/2023   Post procedure discomfort 05/13/2023   Pulmonary fibrosis (HCC) 11/2015    SURGICAL HISTORY: Past Surgical History:  Procedure Laterality Date   COLONOSCOPY     CORONARY STENT PLACEMENT     ESOPHAGOGASTRODUODENOSCOPY (EGD) WITH PROPOFOL N/A 09/23/2016   Procedure: ESOPHAGOGASTRODUODENOSCOPY (EGD) WITH PROPOFOL;  Surgeon: Wyline Mood, MD;  Location:  [facility] ENDOSCOPY;  Service: Endoscopy;  Laterality: N/A;   IR IMAGING GUIDED PORT INSERTION  05/26/2023   IR IMAGING GUIDED PORT INSERTION  07/30/2023   IR RADIOLOGIST EVAL & MGMT  07/20/2023   KYPHOPLASTY N/A 03/14/2020   Procedure: T7 & T11 KYPHOPLASTY;  Surgeon: Kennedy Bucker, MD;  Location: [facility] ORS;  Service: Orthopedics;  Laterality: N/A;   PORT-A-CATH REMOVAL N/A 06/14/2023   Procedure: REMOVAL PORT-A-CATH;  Surgeon: Sung Amabile, DO;  Location: [facility] ORS;  Service: General;  Laterality: N/A;   SHOULDER ACROMIOPLASTY      SOCIAL HISTORY: Social History   Socioeconomic History   Marital status: Divorced    Spouse name: Not on file   Number of children: Not on file   Years of education: Not on file   Highest education level: Not on file  Occupational History   Not on file   Tobacco Use   Smoking status: Former    Current packs/day: 0.00    Average packs/day: 2.0 packs/day for 50.0 years (100.0 ttl pk-yrs)    Types: Cigarettes    Start date: 10/13/1965    Quit date: 10/14/2015    Years since quitting: 8.2   Smokeless tobacco: Former  Building services engineer status: Never Used  Substance and Sexual Activity   Alcohol use: Yes    Alcohol/week: 56.0 standard drinks of alcohol    Types: 56 Cans of beer per week    Comment: "I sit around and drink beer, that's all I got to do"   Drug use: No   Sexual activity: Not Currently  Other Topics Concern   Not on file  Social History Narrative   Not on file   Social Drivers of Health   Financial Resource Strain: Not on file  Food Insecurity: No Food Insecurity (12/29/2023)   Hunger Vital Sign    Worried About Running Out of Food in the Last Year: Never true    Ran Out of Food in the Last Year: Never true  Transportation Needs: Unmet Transportation Needs (01/27/2024)   PRAPARE - Administrator, Civil Service (Medical): Yes    Lack of Transportation (Non-Medical): Yes  Physical Activity: Not on file  Stress: Not on file  Social Connections: Socially Isolated (12/29/2023)   Social Connection and Isolation Panel [NHANES]    Frequency of Communication with Friends and Family: Three times a week    Frequency of Social Gatherings with Friends and Family: Once a week    Attends Religious Services: Never    Database administrator or Organizations: No    Attends Banker Meetings: Never    Marital Status: Divorced  Catering manager Violence: Not At Risk (12/29/2023)   Humiliation, Afraid, Rape, and Kick questionnaire    Fear of Current or Ex-Partner: No    Emotionally Abused: No    Physically Abused: No    Sexually Abused: No    FAMILY HISTORY: Family History  Problem Relation Age of Onset   Heart disease Mother     ALLERGIES:  is allergic to amoxicillin and tizanidine.  MEDICATIONS:   Current Outpatient Medications  Medication Sig Dispense Refill   acetaminophen (TYLENOL) 500 MG tablet Take 1,000 mg by mouth every 6 (six) hours as needed for moderate pain or headache.     albuterol (VENTOLIN HFA) 108 (90 Base) MCG/ACT inhaler Inhale 2 puffs into the lungs every 6 (six) hours as needed for wheezing or shortness of breath. 8 g 11  amLODipine (NORVASC) 5 MG tablet Take 1 tablet (5 mg total) by mouth at bedtime. Skip the dose if systolic BP less than 130 mmHg 30 tablet 5   aspirin EC 81 MG tablet Take 1 tablet (81 mg total) by mouth daily. 90 tablet 3   atorvastatin (LIPITOR) 40 MG tablet Take 1 tablet (40 mg total) by mouth at bedtime. 90 tablet 3   CALCIUM 600/VITAMIN D 600-10 MG-MCG TABS Take 1 tablet by mouth 2 (two) times daily.     clopidogrel (PLAVIX) 75 MG tablet Take 1 tablet (75 mg total) by mouth daily. 90 tablet 3   Dupilumab (DUPIXENT) 300 MG/2ML SOPN Inject 300 mg into the skin every 14 (fourteen) days. 12 mL 1   gabapentin (NEURONTIN) 300 MG capsule TAKE 1 CAPSULE BY MOUTH THREE TIMES A DAY 90 capsule 1   ipratropium-albuterol (DUONEB) 0.5-2.5 (3) MG/3ML SOLN INHALE 1 VIAL THROUGH NEBULIZER EVERY 6 HOURS (Patient taking differently: Take 3 mLs by nebulization every 6 (six) hours as needed.) 270 mL 2   lidocaine-prilocaine (EMLA) cream Apply to affected area once 30 g 3   magnesium oxide (MAG-OX) 400 (241.3 Mg) MG tablet Take 1 tablet (400 mg total) by mouth daily. 30 tablet 0   metoprolol succinate (TOPROL-XL) 50 MG 24 hr tablet Take 50 mg by mouth daily.     montelukast (SINGULAIR) 10 MG tablet Take 10 mg by mouth daily.     Multiple Vitamin (MULTIVITAMIN WITH MINERALS) TABS tablet Take 1 tablet by mouth daily. 30 tablet 0   omega-3 acid ethyl esters (LOVAZA) 1 g capsule Take 1 g by mouth daily.     pantoprazole (PROTONIX) 40 MG tablet Take 1 tablet (40 mg total) by mouth 2 (two) times daily. 90 tablet 3   potassium chloride SA (KLOR-CON M) 20 MEQ tablet Take 20  mEq by mouth daily.     sucralfate (CARAFATE) 1 g tablet Take 1 tablet (1 g total) by mouth 3 (three) times daily before meals. Dissolve in 4tbs warm water, swish and swallow 90 tablet 4   EPINEPHrine 0.3 mg/0.3 mL IJ SOAJ injection Inject 0.3 mg into the muscle as needed for anaphylaxis. (Patient not taking: Reported on 01/27/2024)     ferrous sulfate 325 (65 FE) MG tablet Take 1 tablet (325 mg total) by mouth 2 (two) times daily with a meal. (Patient not taking: Reported on 01/27/2024) 60 tablet 2   guaiFENesin-dextromethorphan (ROBITUSSIN DM) 100-10 MG/5ML syrup Take 10 mLs by mouth every 6 (six) hours as needed for cough. (Patient not taking: Reported on 01/27/2024) 118 mL 0   oxyCODONE (OXY IR/ROXICODONE) 5 MG immediate release tablet Take 1 tablet (5 mg total) by mouth every 8 (eight) hours as needed for severe pain (pain score 7-10). 90 tablet 0   predniSONE (DELTASONE) 10 MG tablet TAKE 1 TABLET (10 MG TOTAL) BY MOUTH DAILY AS NEEDED. TAKE AS DIRECTED. (Patient not taking: Reported on 01/27/2024) 30 tablet 1   Spacer/Aero-Holding Chambers (AEROCHAMBER MV) inhaler Use as instructed (Patient not taking: Reported on 01/27/2024) 1 each 0   No current facility-administered medications for this visit.   Facility-Administered Medications Ordered in Other Visits  Medication Dose Route Frequency Provider Last Rate Last Admin   0.9 %  sodium chloride infusion   Intravenous Continuous Michaelyn Barter, MD 10 mL/hr at 01/27/24 1101 New Bag at 01/27/24 1101   CARBOplatin (PARAPLATIN) 210 mg in sodium chloride 0.9 % 100 mL chemo infusion  210 mg Intravenous Once Michaelyn Barter,  MD       heparin lock flush 100 unit/mL  500 Units Intracatheter Once PRN Michaelyn Barter, MD       PACLitaxel (TAXOL) 90 mg in sodium chloride 0.9 % 250 mL chemo infusion (</= 80mg /m2)  45 mg/m2 (Treatment Plan Recorded) Intravenous Once Michaelyn Barter, MD 265 mL/hr at 01/27/24 1218 90 mg at 01/27/24 1218    REVIEW OF SYSTEMS:    Pertinent information mentioned in HPI All other systems were reviewed with the patient and are negative.  PHYSICAL EXAMINATION: ECOG PERFORMANCE STATUS: 3   Vitals:   01/27/24 1003  BP: 117/69  Pulse: 93  Resp: 20  Temp: (!) 97 F (36.1 C)  SpO2: 96%    Filed Weights   01/27/24 1003  Weight: 188 lb 15 oz (85.7 kg)     GENERAL:alert, no distress and comfortable SKIN: skin color, texture, turgor are normal, no rashes or significant lesions EYES: normal, conjunctiva are pink and non-injected, sclera clear OROPHARYNX:no exudate, no erythema and lips, buccal mucosa, and tongue normal  NECK: supple, thyroid normal size, non-tender, without nodularity LYMPH:  no palpable lymphadenopathy in the cervical, axillary or inguinal LUNGS: clear to auscultation and percussion with normal breathing effort HEART: regular rate & rhythm and no murmurs and no lower extremity edema ABDOMEN:abdomen soft, non-tender and normal bowel sounds Musculoskeletal:no cyanosis of digits and no clubbing  PSYCH: alert & oriented x 3 with fluent speech NEURO: no focal motor/sensory deficits  LABORATORY DATA:  I have reviewed the data as listed Lab Results  Component Value Date   WBC 3.2 (L) 01/27/2024   HGB 9.9 (L) 01/27/2024   HCT 30.1 (L) 01/27/2024   MCV 97.1 01/27/2024   PLT 166 01/27/2024   Recent Labs    06/16/23 0824 06/17/23 0329 01/13/24 0904 01/20/24 0923 01/27/24 0955  NA 133*   < > 132* 134* 131*  K 3.5   < > 4.3 3.5 3.9  CL 98   < > 93* 97* 98  CO2 28   < > 29 28 26   GLUCOSE 152*   < > 117* 137* 116*  BUN 10   < > 15 12 12   CREATININE 0.55*   < > 0.66 0.70 0.69  CALCIUM 7.0*   < > 9.1 8.8* 8.1*  GFRNONAA >60   < > >60 >60 >60  PROT 6.5   < > 7.0 6.6 6.3*  ALBUMIN 2.9*   < > 4.1 4.0 3.8  AST 18   < > 21 25 26   ALT 25   < > 19 22 24   ALKPHOS 87   < > 47 43 46  BILITOT 0.3   < > 0.6 0.6 0.5  BILIDIR <0.1  --   --   --   --   IBILI NOT CALCULATED  --   --   --   --    < >  = values in this interval not displayed.    RADIOGRAPHIC STUDIES: I have personally reviewed the radiological images as listed and agreed with the findings in the report. CT Angio Chest PE W and/or Wo Contrast Result Date: 12/29/2023 CLINICAL DATA:  hemoptysis, hx lung CA EXAM: CT ANGIOGRAPHY CHEST WITH CONTRAST TECHNIQUE: Multidetector CT imaging of the chest was performed using the standard protocol during bolus administration of intravenous contrast. Multiplanar CT image reconstructions and MIPs were obtained to evaluate the vascular anatomy. RADIATION DOSE REDUCTION: This exam was performed according to the departmental dose-optimization program which includes automated  exposure control, adjustment of the mA and/or kV according to patient size and/or use of iterative reconstruction technique. CONTRAST:  75mL OMNIPAQUE IOHEXOL 350 MG/ML SOLN COMPARISON:  CT chest 11/26/2023 FINDINGS: Cardiovascular: Left chest wall dialysis catheter with tip at the distal superior vena cava. Satisfactory opacification of the pulmonary arteries to the segmental level. No evidence of pulmonary embolism. Normal heart size. No significant pericardial effusion. The thoracic aorta is normal in caliber. Severe atherosclerotic plaque of the thoracic aorta. Four-vessel coronary artery calcifications. Mediastinum/Nodes: Prominent mediastinal lymph nodes. No enlarged mediastinal, hilar, or axillary lymph nodes. Thyroid gland, trachea, and esophagus demonstrate no significant findings. Lungs/Pleura: Severe paraseptal and centrilobular emphysematous changes. No focal consolidation. No pulmonary nodule. Interval increase in size of a lobulated right middle lobe pulmonary mass measuring 6.5 x 6.4 cm (from 4.8 x 5.2 cm). Interval increase in size of the left lower lobe pulmonary nodule. Interval increase in size of right lower lobe pulmonary nodules. No pleural effusion. No pneumothorax. Upper Abdomen: Stable bilateral adrenal gland  nodules. Musculoskeletal: No chest wall abnormality. No suspicious lytic or blastic osseous lesions. No acute displaced fracture. Kyphoplasty of the thoracic spine. Review of the MIP images confirms the above findings. IMPRESSION: 1. No pulmonary embolus. 2. Interval increase in size of right middle lobe and left lower lobe pulmonary masses as well as right lower lobe pulmonary nodules in a patient with known lung cancer. 3. Stable bilateral adrenal gland nodules-consistent with known adrenal adenoma. 4.  Aortic Atherosclerosis (ICD10-I70.0). Electronically Signed   By: Tish Frederickson M.D.   On: 12/29/2023 02:42   DG Chest 2 View Result Date: 12/29/2023 CLINICAL DATA:  Cough, lung cancer, hemoptysis EXAM: CHEST - 2 VIEW COMPARISON:  CT 11/26/2023 FINDINGS: Lobular mass within the medial, basilar right middle lobe is again identified in keeping with the patient's known primary pulmonary malignancy. Large bleb again noted adjacent to the mass. No superimposed confluent pulmonary infiltrate. No pneumothorax or pleural effusion. Left internal jugular chest port tip seen expected superior vena cava. Cardiac size within normal limits. No acute bone abnormality. Mild artifact related to the patient's wheelchair IMPRESSION: 1. Right middle lobe mass in keeping with the patient's known primary pulmonary malignancy. No superimposed acute cardiopulmonary disease. Electronically Signed   By: Helyn Numbers M.D.   On: 12/29/2023 01:58

## 2024-01-28 ENCOUNTER — Ambulatory Visit

## 2024-01-31 ENCOUNTER — Ambulatory Visit

## 2024-02-07 ENCOUNTER — Encounter: Payer: Self-pay | Admitting: Radiation Oncology

## 2024-02-07 ENCOUNTER — Ambulatory Visit
Admission: RE | Admit: 2024-02-07 | Discharge: 2024-02-07 | Disposition: A | Discharge status: Home / Self Care | Source: Ambulatory Visit | Attending: Radiation Oncology | Admitting: Radiation Oncology

## 2024-02-07 VITALS — BP 125/74 | HR 99 | Temp 97.5°F | Resp 16

## 2024-02-07 DIAGNOSIS — Z510 Encounter for antineoplastic radiation therapy: Secondary | ICD-10-CM | POA: Diagnosis not present

## 2024-02-07 DIAGNOSIS — D381 Neoplasm of uncertain behavior of trachea, bronchus and lung: Secondary | ICD-10-CM

## 2024-02-07 NOTE — Progress Notes (Signed)
 Radiation Oncology Follow up Note  Name: Austin Patterson   Date:   02/07/2024 MRN:  [id number] DOB: [birth year]    This 68 y.o. male presents to the clinic today for reevaluation status post initial 74 Gray in 15 fractions to his chest for stage IV adenocarcinoma with spindle cell features presenting with hemoptysis and growing mass in his chest and mediastinum.  REFERRING PROVIDER: Jefferey Minerva, PA  HPI: Patient is a 68 year old male.  Now seen out 2 weeks and reevaluation having received 30 Gray in 15 fractions to his chest and mediastinum for stage IV poorly differentiated adenocarcinoma of the lung with spindle cell component which was progressing and causing hemoptysis.  He is seen today in follow-up.  He has had complete resolution of his hemoptysis.  He states his breathing is stable although he is on continuous nasal oxygen .  He is having no dysphagia at this time.  COMPLICATIONS OF TREATMENT: none  FOLLOW UP COMPLIANCE: keeps appointments   PHYSICAL EXAM:  BP 125/74   Pulse 99   Temp (!) 97.5 F (36.4 C) (Tympanic)   Resp 16  Wheelchair-bound male in on nasal oxygen  in NAD.  Well-developed well-nourished patient in NAD. HEENT reveals PERLA, EOMI, discs not visualized.  Oral cavity is clear. No oral mucosal lesions are identified. Neck is clear without evidence of cervical or supraclavicular adenopathy. Lungs are clear to A&P. Cardiac examination is essentially unremarkable with regular rate and rhythm without murmur rub or thrill. Abdomen is benign with no organomegaly or masses noted. Motor sensory and DTR levels are equal and symmetric in the upper and lower extremities. Cranial nerves II through XII are grossly intact. Proprioception is intact. No peripheral adenopathy or edema is identified. No motor or sensory levels are noted. Crude visual fields are within normal range.  RADIOLOGY RESULTS: Simulation today shows some progression of the right lung mass.  PLAN: This time  lets go ahead and boost the right lung mass which is slightly progressing another 30 Gray in 15 fractions.  Risks and benefits of treatment again including possible fatigue skin reaction development of cough alteration of blood counts all were reviewed with the patient.  We will simulate him today and start early next week on his boost field.  Patient comprehends my recommendations well.  I would like to take this opportunity to thank you for allowing me to participate in the care of your patient.Glenis Langdon, MD

## 2024-02-08 DIAGNOSIS — Z510 Encounter for antineoplastic radiation therapy: Secondary | ICD-10-CM | POA: Diagnosis not present

## 2024-02-09 ENCOUNTER — Ambulatory Visit: Payer: Medicare HMO | Admitting: Pulmonary Disease

## 2024-02-09 VITALS — BP 136/70 | HR 96 | Temp 97.6°F | Ht 67.0 in | Wt 187.0 lb

## 2024-02-09 DIAGNOSIS — J9611 Chronic respiratory failure with hypoxia: Secondary | ICD-10-CM

## 2024-02-09 DIAGNOSIS — C3491 Malignant neoplasm of unspecified part of right bronchus or lung: Secondary | ICD-10-CM | POA: Diagnosis not present

## 2024-02-09 DIAGNOSIS — Z510 Encounter for antineoplastic radiation therapy: Secondary | ICD-10-CM

## 2024-02-09 DIAGNOSIS — Z711 Person with feared health complaint in whom no diagnosis is made: Secondary | ICD-10-CM

## 2024-02-09 DIAGNOSIS — Z87891 Personal history of nicotine dependence: Secondary | ICD-10-CM

## 2024-02-09 DIAGNOSIS — Z9981 Dependence on supplemental oxygen: Secondary | ICD-10-CM

## 2024-02-09 DIAGNOSIS — E8801 Alpha-1-antitrypsin deficiency: Secondary | ICD-10-CM

## 2024-02-09 DIAGNOSIS — J4550 Severe persistent asthma, uncomplicated: Secondary | ICD-10-CM

## 2024-02-09 DIAGNOSIS — J449 Chronic obstructive pulmonary disease, unspecified: Secondary | ICD-10-CM

## 2024-02-09 NOTE — Patient Instructions (Signed)
 VISIT SUMMARY:  You came in today for a follow-up regarding your stage four non-small cell lung cancer and severe COPD with asthma overlap. We discussed your current treatment plan, including your concerns about the effectiveness of Keytruda  monotherapy and your recent episode of significant shortness of breath.  YOUR PLAN:  -STAGE 4 NON-SMALL CELL LUNG CANCER: Stage 4 non-small cell lung cancer is an advanced form of lung cancer that has spread to other parts of the body. You are currently undergoing radiation therapy and will resume chemotherapy as scheduled. We will manage your pain as needed and discuss treatment options and their effectiveness with your oncology team.  -SEVERE COPD WITH ASTHMA OVERLAP: Severe COPD with asthma overlap is a chronic lung condition that makes it hard to breathe. You are managing it well with your current inhaler regimen and Dupixent . Please continue with your current medications and ensure you take your Dupixent  as scheduled. Avoid strenuous physical activity to prevent episodes of significant shortness of breath. We will follow up in two months.  -GOALS OF CARE: We discussed your reliance on faith and divine intervention in managing your health. You prefer to focus on quality of life in your care decisions. We will discuss your goals of care and advanced directives with you and your family to ensure your treatment plan aligns with your values and preferences.  INSTRUCTIONS:  Please continue with your current medications and treatments as discussed. Avoid strenuous physical activity to prevent shortness of breath. We will follow up in two months to reassess your condition. If you have any concerns or experience any new symptoms, please contact our office.

## 2024-02-09 NOTE — Progress Notes (Signed)
 Subjective:    Patient ID: Austin Patterson, male    DOB: 02-15-1956, 68 y.o.   MRN: [id number]  Patient Care Team: Jefferey Minerva, PA as PCP - General (Physician Assistant) Constancia Delton, MD as PCP - Cardiology (Cardiology) Marc Senior, MD as Consulting Physician (Pulmonary Disease) Drake Gens, RN as Oncology Nurse Navigator Loreatha Rodney, MD as Consulting Physician (Oncology) Glenis Langdon, MD as Consulting Physician (Radiation Oncology)  Chief Complaint  Patient presents with   Follow-up    Cough with clear to yellow phlegm. Shortness of breath.     BACKGROUND/INTERVAL:Austin Patterson is a very complex 68 year old former smoker (100 PY) with stage IV very severe COPD, severe persistent asthma and chronic respiratory failure with hypoxia who presents for follow-up of these issues.  I last saw him on 06 October 2023 and at that time he was well compensated from his respiratory issues.  At baseline the patient is very sedentary.  This is due to debilitating dyspnea.  He is compliant with oxygen  at 2 L/min 24/7.  He has a complex respiratory history with very severe asthma with IgE initially recorded at 1,950 this was noted to be directed to Aspergillus fumigatus (ABPA).  He is also a heterozygous alpha-1 antitrypsin MZ phenotype with normal alpha-1 levels.  He has been on Dupixent  for management of his asthma component.  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  HPI Discussed the use of AI scribe software for clinical note transcription with the patient, who gave verbal consent to proceed.  History of Present Illness   Austin Patterson is a 68 year old male with severe COPD and stage four non-small cell lung cancer who presents for follow-up regarding his COPD/asthma overlap treatment.  He is currently undergoing radiation treatment for  stage four non-small cell lung cancer, likely adenocarcinoma. He experiences significant pain in the area of the main tumor, which he describes as the size of his fist. A recent CT scan on April 21st was performed to assess the tumor's response to previous treatments. He has been on Keytruda , but notes that when it was used alone, the cancer began to grow again. He is confused about the treatment plan, particularly why additional medications were not continued alongside Keytruda , as he felt the combination was more effective. He mentions a six-week period without effective treatment, which he believes contributed to the progression of his cancer.  He has severe COPD with asthma overlap and uses inhalers, including Advair, and Dupixent . He finds the inhalers effective and does not feel the need for any changes. He forgot to take his Dupixent  shot this morning due to a missed alarm but plans to take it later today. He describes an episode of significant shortness of breath and a drop in oxygen  saturation to 76% after walking a considerable distance without his phone or inhaler, which he found alarming. During the review of symptoms, he reports a 'little tiny wheeze' today, likely due to pollen, but otherwise feels his breathing is 'pretty normal.' He mentions that he was able to mow the lawn recently, although he had to take a break due to shortness of breath.     Patient is end-stage.  Review of Systems A 10 point review of systems was performed and it is as noted above otherwise negative.   Patient Active Problem List   Diagnosis Date Noted  Anemia due to antineoplastic chemotherapy 01/27/2024   Encounter for antineoplastic chemotherapy 01/06/2024   Hemoptysis 12/29/2023   Hypocalcemia 09/06/2023   Overweight (BMI 25.0-29.9) 08/08/2023   SIRS (systemic inflammatory response syndrome) (HCC) 08/06/2023   Cancer related pain 07/05/2023   Paroxysmal atrial fibrillation (HCC) 06/16/2023   Sepsis (HCC)  06/12/2023   Iron  deficiency anemia 06/08/2023   Stage IV adenocarcinoma of lung (HCC) 05/18/2023   Swelling of right foot 05/11/2023   Heterozygous alpha 1-antitrypsin deficiency (HCC) 04/27/2023   Cough 04/03/2023   Alcohol use 04/03/2023   Constipation 12/10/2022   COPD exacerbation (HCC) 02/23/2022   GERD without esophagitis 02/23/2022   Alpha-1-antitrypsin deficiency carrier 01/10/2021   History of nonmelanoma skin cancer 12/19/2020   Asthma 03/12/2020   Chronic respiratory failure with hypoxia (HCC) 03/12/2020   Hyperlipidemia    Asthma-COPD overlap syndrome (HCC)    Bleeding nose    Alcohol abuse    RUQ abdominal pain    Anaphylaxis 11/01/2019   Hypotension 11/01/2019   CAD (coronary artery disease) 11/01/2019   H/O heart artery stent 11/01/2019   Acute on chronic respiratory failure with hypoxia (HCC)    RUQ pain    Essential hypertension    Pulmonary fibrosis (HCC) 08/30/2019   Hypomagnesemia 03/31/2017   Hiatal hernia    Reflux esophagitis    Hematemesis without nausea    Hyponatremia 09/21/2016   Multifocal pneumonia 12/09/2015    Social History   Tobacco Use   Smoking status: Former    Current packs/day: 0.00    Average packs/day: 2.0 packs/day for 50.0 years (100.0 ttl pk-yrs)    Types: Cigarettes    Start date: 10/13/1965    Quit date: 10/14/2015    Years since quitting: 8.3   Smokeless tobacco: Former  Substance Use Topics   Alcohol use: Yes    Alcohol/week: 56.0 standard drinks of alcohol    Types: 56 Cans of beer per week    Comment: "I sit around and drink beer, that's all I got to do"    Allergies  Allergen Reactions   Amoxicillin  Anaphylaxis   Tizanidine      Feet and ankle swell     Current Meds  Medication Sig   acetaminophen  (TYLENOL ) 500 MG tablet Take 1,000 mg by mouth every 6 (six) hours as needed for moderate pain or headache.   albuterol  (VENTOLIN  HFA) 108 (90 Base) MCG/ACT inhaler Inhale 2 puffs into the lungs every 6 (six)  hours as needed for wheezing or shortness of breath.   aspirin  EC 81 MG tablet Take 1 tablet (81 mg total) by mouth daily.   atorvastatin  (LIPITOR) 40 MG tablet Take 1 tablet (40 mg total) by mouth at bedtime.   CALCIUM  600/VITAMIN D  600-10 MG-MCG TABS Take 1 tablet by mouth 2 (two) times daily.   clopidogrel  (PLAVIX ) 75 MG tablet Take 1 tablet (75 mg total) by mouth daily.   Dupilumab  (DUPIXENT ) 300 MG/2ML SOPN Inject 300 mg into the skin every 14 (fourteen) days.   EPINEPHrine  0.3 mg/0.3 mL IJ SOAJ injection Inject 0.3 mg into the muscle as needed for anaphylaxis.   gabapentin  (NEURONTIN ) 300 MG capsule TAKE 1 CAPSULE BY MOUTH THREE TIMES A DAY   guaiFENesin -dextromethorphan  (ROBITUSSIN DM) 100-10 MG/5ML syrup Take 10 mLs by mouth every 6 (six) hours as needed for cough.   ipratropium-albuterol  (DUONEB) 0.5-2.5 (3) MG/3ML SOLN INHALE 1 VIAL THROUGH NEBULIZER EVERY 6 HOURS (Patient taking differently: Take 3 mLs by nebulization every 6 (six) hours as needed.)  lidocaine -prilocaine  (EMLA ) cream Apply to affected area once   magnesium  oxide (MAG-OX) 400 (241.3 Mg) MG tablet Take 1 tablet (400 mg total) by mouth daily.   metoprolol  succinate (TOPROL -XL) 50 MG 24 hr tablet Take 50 mg by mouth daily.   montelukast  (SINGULAIR ) 10 MG tablet Take 10 mg by mouth daily.   Multiple Vitamin (MULTIVITAMIN WITH MINERALS) TABS tablet Take 1 tablet by mouth daily.   omega-3 acid ethyl esters (LOVAZA) 1 g capsule Take 1 g by mouth daily.   oxyCODONE  (OXY IR/ROXICODONE ) 5 MG immediate release tablet Take 1 tablet (5 mg total) by mouth every 8 (eight) hours as needed for severe pain (pain score 7-10).   pantoprazole  (PROTONIX ) 40 MG tablet Take 1 tablet (40 mg total) by mouth 2 (two) times daily.   potassium chloride  SA (KLOR-CON  M) 20 MEQ tablet Take 20 mEq by mouth daily.   predniSONE  (DELTASONE ) 10 MG tablet TAKE 1 TABLET (10 MG TOTAL) BY MOUTH DAILY AS NEEDED. TAKE AS DIRECTED.   Spacer/Aero-Holding Chambers  (AEROCHAMBER MV) inhaler Use as instructed   sucralfate  (CARAFATE ) 1 g tablet Take 1 tablet (1 g total) by mouth 3 (three) times daily before meals. Dissolve in 4tbs warm water , swish and swallow    Immunization History  Administered Date(s) Administered   Fluad Quad(high Dose 65+) 10/03/2023   Influenza Inj Mdck Quad Pf 08/10/2019   Influenza Inj Mdck Quad With Preservative 07/18/2018   Influenza,inj,Quad PF,6+ Mos 10/15/2015, 08/31/2016   Influenza-Unspecified 08/10/2019, 07/19/2020, 07/19/2022   Moderna SARS-COV2 Booster Vaccination 10/23/2020   Moderna Sars-Covid-2 Vaccination 11/21/2019, 12/21/2019   Pneumococcal Polysaccharide-23 10/15/2015        Objective:     BP 136/70 (BP Location: Left Arm, Patient Position: Sitting, Cuff Size: Normal)   Pulse 96   Temp 97.6 F (36.4 C) (Temporal)   Ht 5\' 7"  (1.702 m)   Wt 187 lb (84.8 kg)   SpO2 90%   BMI 29.29 kg/m   SpO2: 90 % O2 Device: Nasal cannula O2 Flow Rate (L/min): 2 L/min O2 Type: Continuous O2  GENERAL: Chronically ill appearing gentleman, chronic use of accessories, presents in transport chair.  Wearing oxygen  via nasal cannula.  No acute respiratory distress.  No plethora.  HEAD: Normocephalic, atraumatic. EYES: Pupils equal, round, reactive to light.  No scleral icterus. MOUTH: Teeth in poor repair, oral mucosa moist, no thrush. NECK: Supple. No thyromegaly. Trachea midline. No JVD.  No adenopathy. PULMONARY: Increased AP diameter, significant kyphosis. Distant breath sounds.  No adventitious sounds.   CARDIOVASCULAR: S1 and S2. Regular rate and rhythm.  Distant heart tones no murmur appreciated. GASTROINTESTINAL: Benign. MUSCULOSKELETAL: No joint deformity, no clubbing, no edema. NEUROLOGIC: No overt focal deficit.  Speech is fluent.  Awake, alert. SKIN: Intact,warm,dry. PSYCH: Mood and behavior normal.      Assessment & Plan:     ICD-10-CM   1. Stage 4 very severe COPD by GOLD classification (HCC)   J44.9     2. Severe persistent asthma without complication  J45.50     3. Chronic respiratory failure with hypoxia (HCC)  J96.11     4. Heterozygous alpha 1-antitrypsin deficiency (HCC)  E88.01     5. Adenocarcinoma of right lung, stage 4 (HCC)  C34.91     6. Concern about end of life  Z71.1      Discussion:    Stage 4 non-small cell lung cancer Stage 4 non-small cell lung cancer, likely adenocarcinoma. Currently undergoing radiation therapy with significant pain  at the tumor site. Concerns about the effectiveness of Keytruda  monotherapy, noting previous regimens were more effective when combined with other agents. Scheduled to resume chemotherapy. Expresses uncertainty about the current treatment plan and its effectiveness. - Continue radiation therapy. - Resume chemotherapy as scheduled. - Manage pain as needed. - Discuss treatment options and effectiveness with oncology team.  Severe COPD with asthma overlap Severe COPD with asthma overlap, managed with inhalers and Dupixent . Reports well-managed condition with current inhaler regimen and Dupixent , although he missed a dose due to a reminder issue. Recent episode of significant desaturation to 76% after physical exertion, indicating potential exacerbation risk. - Continue current inhaler regimen and Dupixent . - Ensure adherence to Dupixent  dosing schedule. - Advise against strenuous physical activity to prevent desaturation. - Schedule follow-up in two months.  Goals of Care Relies on faith and divine intervention in managing his health condition. Acknowledges the severity of his illness and the challenges of treatment, indicating a preference for quality of life considerations in his care decisions. - Discuss goals of care and advanced directives with patient and family. - Ensure alignment of treatment plan with patient's values and preferences.     Advised if symptoms do not improve or worsen, to please contact office for  sooner follow up or seek emergency care.    I spent 40 minutes of dedicated to the care of this patient on the date of this encounter to include pre-visit review of records, face-to-face time with the patient discussing conditions above, post visit ordering of testing, clinical documentation with the electronic health record, making appropriate referrals as documented, and communicating necessary findings to members of the patients care team.     C. Chloe Counter, MD Advanced Bronchoscopy PCCM [facility]    *This note was generated using voice recognition software/Dragon and/or AI transcription program.  Despite best efforts to proofread, errors can occur which can change the meaning. Any transcriptional errors that result from this process are unintentional and may not be fully corrected at the time of dictation.

## 2024-02-13 ENCOUNTER — Encounter: Payer: Self-pay | Admitting: Pulmonary Disease

## 2024-02-14 ENCOUNTER — Ambulatory Visit
Admission: RE | Admit: 2024-02-14 | Discharge: 2024-02-14 | Disposition: A | Discharge status: Home / Self Care | Source: Ambulatory Visit | Attending: Radiation Oncology | Admitting: Radiation Oncology

## 2024-02-14 DIAGNOSIS — Z510 Encounter for antineoplastic radiation therapy: Secondary | ICD-10-CM | POA: Diagnosis not present

## 2024-02-15 ENCOUNTER — Ambulatory Visit
Admission: RE | Admit: 2024-02-15 | Discharge: 2024-02-15 | Disposition: A | Discharge status: Home / Self Care | Source: Ambulatory Visit | Attending: Radiation Oncology | Admitting: Radiation Oncology

## 2024-02-15 ENCOUNTER — Other Ambulatory Visit: Payer: Self-pay

## 2024-02-15 DIAGNOSIS — Z510 Encounter for antineoplastic radiation therapy: Secondary | ICD-10-CM | POA: Diagnosis not present

## 2024-02-15 LAB — RAD ONC ARIA SESSION SUMMARY
Course Elapsed Days: 0
Plan Fractions Treated to Date: 1
Plan Prescribed Dose Per Fraction: 2 Gy
Plan Total Fractions Prescribed: 15
Plan Total Prescribed Dose: 30 Gy
Reference Point Dosage Given to Date: 2 Gy
Reference Point Session Dosage Given: 2 Gy
Session Number: 1

## 2024-02-16 ENCOUNTER — Ambulatory Visit
Admission: RE | Admit: 2024-02-16 | Discharge: 2024-02-16 | Disposition: A | Discharge status: Home / Self Care | Source: Ambulatory Visit | Attending: Radiation Oncology | Admitting: Radiation Oncology

## 2024-02-16 ENCOUNTER — Other Ambulatory Visit: Payer: Self-pay

## 2024-02-16 ENCOUNTER — Encounter: Payer: Self-pay | Admitting: Internal Medicine

## 2024-02-16 DIAGNOSIS — Z510 Encounter for antineoplastic radiation therapy: Secondary | ICD-10-CM | POA: Diagnosis not present

## 2024-02-16 LAB — RAD ONC ARIA SESSION SUMMARY
Course Elapsed Days: 1
Plan Fractions Treated to Date: 2
Plan Prescribed Dose Per Fraction: 2 Gy
Plan Total Fractions Prescribed: 15
Plan Total Prescribed Dose: 30 Gy
Reference Point Dosage Given to Date: 4 Gy
Reference Point Session Dosage Given: 2 Gy
Session Number: 2

## 2024-02-17 ENCOUNTER — Ambulatory Visit
Admission: RE | Admit: 2024-02-17 | Discharge: 2024-02-17 | Disposition: A | Discharge status: Home / Self Care | Source: Ambulatory Visit | Attending: Radiation Oncology | Admitting: Radiation Oncology

## 2024-02-17 ENCOUNTER — Inpatient Hospital Stay

## 2024-02-17 ENCOUNTER — Inpatient Hospital Stay: Attending: Internal Medicine

## 2024-02-17 ENCOUNTER — Inpatient Hospital Stay (HOSPITAL_BASED_OUTPATIENT_CLINIC_OR_DEPARTMENT_OTHER): Admitting: Internal Medicine

## 2024-02-17 ENCOUNTER — Encounter: Payer: Self-pay | Admitting: Internal Medicine

## 2024-02-17 ENCOUNTER — Other Ambulatory Visit: Payer: Self-pay

## 2024-02-17 VITALS — BP 129/96 | HR 81 | Temp 98.1°F | Resp 16 | Wt 184.0 lb

## 2024-02-17 VITALS — BP 156/89 | HR 87

## 2024-02-17 DIAGNOSIS — T451X5A Adverse effect of antineoplastic and immunosuppressive drugs, initial encounter: Secondary | ICD-10-CM | POA: Diagnosis not present

## 2024-02-17 DIAGNOSIS — C3491 Malignant neoplasm of unspecified part of right bronchus or lung: Secondary | ICD-10-CM | POA: Insufficient documentation

## 2024-02-17 DIAGNOSIS — Z5111 Encounter for antineoplastic chemotherapy: Secondary | ICD-10-CM | POA: Diagnosis present

## 2024-02-17 DIAGNOSIS — Z7902 Long term (current) use of antithrombotics/antiplatelets: Secondary | ICD-10-CM | POA: Diagnosis not present

## 2024-02-17 DIAGNOSIS — Z79899 Other long term (current) drug therapy: Secondary | ICD-10-CM | POA: Insufficient documentation

## 2024-02-17 DIAGNOSIS — D6481 Anemia due to antineoplastic chemotherapy: Secondary | ICD-10-CM

## 2024-02-17 DIAGNOSIS — D649 Anemia, unspecified: Secondary | ICD-10-CM | POA: Insufficient documentation

## 2024-02-17 DIAGNOSIS — E871 Hypo-osmolality and hyponatremia: Secondary | ICD-10-CM | POA: Diagnosis not present

## 2024-02-17 DIAGNOSIS — Z7982 Long term (current) use of aspirin: Secondary | ICD-10-CM | POA: Insufficient documentation

## 2024-02-17 DIAGNOSIS — Z510 Encounter for antineoplastic radiation therapy: Secondary | ICD-10-CM | POA: Diagnosis present

## 2024-02-17 DIAGNOSIS — Z87891 Personal history of nicotine dependence: Secondary | ICD-10-CM | POA: Diagnosis not present

## 2024-02-17 LAB — CMP (CANCER CENTER ONLY)
ALT: 17 U/L (ref 0–44)
AST: 24 U/L (ref 15–41)
Albumin: 4.1 g/dL (ref 3.5–5.0)
Alkaline Phosphatase: 49 U/L (ref 38–126)
Anion gap: 9 (ref 5–15)
BUN: 13 mg/dL (ref 8–23)
CO2: 26 mmol/L (ref 22–32)
Calcium: 8.7 mg/dL — ABNORMAL LOW (ref 8.9–10.3)
Chloride: 99 mmol/L (ref 98–111)
Creatinine: 0.67 mg/dL (ref 0.61–1.24)
GFR, Estimated: >60 mL/min (ref >60)
Glucose, Bld: 115 mg/dL — ABNORMAL HIGH (ref 70–99)
Potassium: 4.1 mmol/L (ref 3.5–5.1)
Sodium: 134 mmol/L — ABNORMAL LOW (ref 135–145)
Total Bilirubin: 0.6 mg/dL (ref 0.0–1.2)
Total Protein: 6.8 g/dL (ref 6.5–8.1)

## 2024-02-17 LAB — CBC WITH DIFFERENTIAL (CANCER CENTER ONLY)
Abs Immature Granulocytes: 0.01 10*3/uL (ref 0.00–0.07)
Basophils Absolute: 0 10*3/uL (ref 0.0–0.1)
Basophils Relative: 1 %
Eosinophils Absolute: 0 10*3/uL (ref 0.0–0.5)
Eosinophils Relative: 1 %
HCT: 32.2 % — ABNORMAL LOW (ref 39.0–52.0)
Hemoglobin: 10.5 g/dL — ABNORMAL LOW (ref 13.0–17.0)
Immature Granulocytes: 0 %
Lymphocytes Relative: 18 %
Lymphs Abs: 0.6 10*3/uL — ABNORMAL LOW (ref 0.7–4.0)
MCH: 32.6 pg (ref 26.0–34.0)
MCHC: 32.6 g/dL (ref 30.0–36.0)
MCV: 100 fL (ref 80.0–100.0)
Monocytes Absolute: 0.6 10*3/uL (ref 0.1–1.0)
Monocytes Relative: 16 %
Neutro Abs: 2.3 10*3/uL (ref 1.7–7.7)
Neutrophils Relative %: 64 %
Platelet Count: 252 10*3/uL (ref 150–400)
RBC: 3.22 MIL/uL — ABNORMAL LOW (ref 4.22–5.81)
RDW: 19.5 % — ABNORMAL HIGH (ref 11.5–15.5)
WBC Count: 3.5 10*3/uL — ABNORMAL LOW (ref 4.0–10.5)
nRBC: 0 % (ref 0.0–0.2)

## 2024-02-17 LAB — RAD ONC ARIA SESSION SUMMARY
Course Elapsed Days: 2
Plan Fractions Treated to Date: 3
Plan Prescribed Dose Per Fraction: 2 Gy
Plan Total Fractions Prescribed: 15
Plan Total Prescribed Dose: 30 Gy
Reference Point Dosage Given to Date: 6 Gy
Reference Point Session Dosage Given: 2 Gy
Session Number: 3

## 2024-02-17 MED ORDER — SODIUM CHLORIDE 0.9 % IV SOLN
INTRAVENOUS | Status: DC
  Filled 2024-02-17: qty 250

## 2024-02-17 MED ORDER — CETIRIZINE HCL 10 MG/ML IV SOLN
10.0000 mg | Freq: Once | INTRAVENOUS | Status: AC
  Administered 2024-02-17: 10 mg via INTRAVENOUS
  Filled 2024-02-17: qty 1

## 2024-02-17 MED ORDER — SODIUM CHLORIDE 0.9 % IV SOLN
213.2000 mg | Freq: Once | INTRAVENOUS | Status: AC
  Administered 2024-02-17: 210 mg via INTRAVENOUS
  Filled 2024-02-17: qty 21

## 2024-02-17 MED ORDER — PALONOSETRON HCL INJECTION 0.25 MG/5ML
0.2500 mg | Freq: Once | INTRAVENOUS | Status: AC
Start: 2024-02-17 — End: 2024-02-17
  Administered 2024-02-17: 0.25 mg via INTRAVENOUS
  Filled 2024-02-17: qty 5

## 2024-02-17 MED ORDER — FAMOTIDINE IN NACL 20-0.9 MG/50ML-% IV SOLN
20.0000 mg | Freq: Once | INTRAVENOUS | Status: AC
Start: 2024-02-17 — End: 2024-02-17
  Administered 2024-02-17: 20 mg via INTRAVENOUS
  Filled 2024-02-17: qty 50

## 2024-02-17 MED ORDER — SODIUM CHLORIDE 0.9 % IV SOLN
45.0000 mg/m2 | Freq: Once | INTRAVENOUS | Status: AC
  Administered 2024-02-17: 90 mg via INTRAVENOUS
  Filled 2024-02-17: qty 15

## 2024-02-17 MED ORDER — HEPARIN SOD (PORK) LOCK FLUSH 100 UNIT/ML IV SOLN
500.0000 [IU] | Freq: Once | INTRAVENOUS | Status: AC | PRN
  Administered 2024-02-17: 500 [IU]
  Filled 2024-02-17: qty 5

## 2024-02-17 MED ORDER — DEXAMETHASONE SODIUM PHOSPHATE 10 MG/ML IJ SOLN
10.0000 mg | Freq: Once | INTRAMUSCULAR | Status: AC
  Administered 2024-02-17: 10 mg via INTRAVENOUS
  Filled 2024-02-17: qty 1

## 2024-02-17 NOTE — Patient Instructions (Signed)
 [facility] - A DEPT OF [facility]  Discharge Instructions: Thank you for choosing [facility] to provide your oncology and hematology care.  If you have a lab appointment with the Cancer Center, please go directly to the Cancer Center and check in at the registration area.  Wear comfortable clothing and clothing appropriate for easy access to any Portacath or PICC line.   We strive to give you quality time with your provider. You may need to reschedule your appointment if you arrive late (15 or more minutes).  Arriving late affects you and other patients whose appointments are after yours.  Also, if you miss three or more appointments without notifying the office, you may be dismissed from the clinic at the provider's discretion.      For prescription refill requests, have your pharmacy contact our office and allow 72 hours for refills to be completed.     To help prevent nausea and vomiting after your treatment, we encourage you to take your nausea medication as directed.  BELOW ARE SYMPTOMS THAT SHOULD BE REPORTED IMMEDIATELY: *FEVER GREATER THAN 100.4 F (38 C) OR HIGHER *CHILLS OR SWEATING *NAUSEA AND VOMITING THAT IS NOT CONTROLLED WITH YOUR NAUSEA MEDICATION *UNUSUAL SHORTNESS OF BREATH *UNUSUAL BRUISING OR BLEEDING *URINARY PROBLEMS (pain or burning when urinating, or frequent urination) *BOWEL PROBLEMS (unusual diarrhea, constipation, pain near the anus) TENDERNESS IN MOUTH AND THROAT WITH OR WITHOUT PRESENCE OF ULCERS (sore throat, sores in mouth, or a toothache) UNUSUAL RASH, SWELLING OR PAIN   Items with * indicate a potential emergency and should be followed up as soon as possible or go to the Emergency Department if any problems should occur.  Please show the CHEMOTHERAPY ALERT CARD or IMMUNOTHERAPY ALERT CARD at check-in to the Emergency Department and triage nurse.  Should you have questions after your visit or need to  cancel or reschedule your appointment, please contact [facility] - A DEPT OF [facility]  [phone] and follow the prompts.  Office hours are 8:00 a.m. to 4:30 p.m. Monday - Friday. Please note that voicemails left after 4:00 p.m. may not be returned until the following business day.  We are closed weekends and major holidays. You have access to a nurse at all times for urgent questions. Please call the main number to the clinic [phone] and follow the prompts.  For any non-urgent questions, you may also contact your provider using MyChart. We now offer e-Visits for anyone 64 and older to request care online for non-urgent symptoms. For details visit [email or web address].   Also download the MyChart app! Go to the app store, search "MyChart", open the app, select [facility], and log in with your MyChart username and password.

## 2024-02-17 NOTE — Progress Notes (Signed)
 [facility] CONSULT NOTE  Patient Care Team: Jefferey Minerva, PA as PCP - General (Physician Assistant) Constancia Delton, MD as PCP - Cardiology (Cardiology) Marc Senior, MD as Consulting Physician (Pulmonary Disease) Drake Gens, RN as Oncology Nurse Navigator Loreatha Rodney, MD as Consulting Physician (Oncology) Glenis Langdon, MD as Consulting Physician (Radiation Oncology)   CANCER STAGING   Cancer Staging  Stage IV adenocarcinoma of lung [facility]) Staging form: Lung, AJCC 8th Edition - Clinical: Stage IV (cT4, cN3, cM1) - Signed by Zeyna Mkrtchyan, MD on 05/20/2023 Stage prefix: Initial diagnosis   ASSESSMENT & PLAN:  Austin Patterson 68 y.o. 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  Pathology showed poorly differentiated carcinoma with spindle cell component.  Tumor cells positive for CK and focal TTF-1.  The differential diagnosis includes pleomorphic carcinoma and carcinosarcoma.  Definitive classification cannot be performed on the biopsy specimen.  -Tempus testing from August 2024 showed PD-L1 0% with no targetable mutations.  -Proceeded with systemic treatment with concern for involvement of upper abdominal lymph nodes and pulmonary metastasis.  s/p dose reduced carboplatin , Taxol  and Keytruda  on 06/01/2023 x 4 cycles.  Poor functional status/ poor tolerance with two hospitalizations for sepsis/ and second  for weakness.  On Keytruda  maintenance until February 2025.  On PET imaging, progression of the disease in the right middle lobe lung mass and left lower lobe nodule.  Bilateral hilar, mediastinal and upper abdominal lymphadenopathy overall stable.  -Started on concurrent chemo RT on 01/05/2024.  Labs reviewed and acceptable for treatment.  Will proceed with weekly cycle 4 of CarboTaxol.  Then for total of 7 cycles.  Then consolidate with dose reduced carboplatin  and gemcitabine.  Plan to repeat CT imaging end of May 2025.  -Guardant360 from Feb 2025 - no targetable mutations.  # Chronic respiratory failure on 2 L oxygen  # Asthma and COPD overlap # Idiopathic pulmonary fibrosis -Management per pulmonary.  On Dupixent   # Hypocalcemia -Vitamin D  level is normal -Continue with calcium  vitamin D  supplements.  # Hyponatremia -Chronic in nature.  # Normocytic anemia -History of iron  deficiency.  Completed IV Venofer  x 5 doses.   -Recent iron  panel is normal.  Hemoglobin 9.9 likely secondary to chemo RT.  # Cancer related pain -In mid chest.  Continue with oxycodone  5 mg Q8 as needed.   #CAD- on aspirin , plavix   # IV access-port removed on 06/14/23 due to sepsis.   -Inserted on left side on 07/30/23.  Orders Placed This Encounter  Procedures   CBC with Differential (Cancer Center Only)    Standing Status:   Future    Expected Date:   03/02/2024    Expiration Date:   03/02/2025   CMP (Cancer Center only)    Standing Status:   Future    Expected Date:   03/02/2024    Expiration Date:   03/02/2025   RTC in 1 week for MD visit, labs, cycle 6 carbo Taxol .  The total time spent in the appointment was 30  minutes encounter with patients including review of chart and various tests results, discussions about plan of care and coordination of care plan   All questions were answered. The patient knows to call the clinic with any problems, questions or concerns. No barriers to learning was  detected.  Loreatha Rodney, MD 5/1/202512:06 PM   HISTORY OF PRESENTING ILLNESS:  Austin Patterson 68 y.o. 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  Cannot rule out sarcomatoid component.  He is a remote smoker quit in 2016.  Prior smoked 2 packs a day since age 38.  He is on 2 L oxygen  since 2017.  Lives alone.  Was in a wheelchair.  Does not have family close by.  Follows with Dr. Viva Grise.   Interval history Patient was seen today as follow-up prior to cycle 5 of CarboTaxol. He is breathing is about the same.  Does report some esophagitis which is manageable with sucralfate .  Has chronic chest pain and takes oxycodone  twice a day.  Denies any other concerns.   I have reviewed his chart and materials related to his cancer extensively and collaborated history with the patient. Summary of oncologic history is as follows:  Oncology History  Stage IV adenocarcinoma of lung (HCC)  05/18/2023 Initial Diagnosis   Sarcomatoid carcinoma of lung (HCC)   05/20/2023 Cancer Staging   Staging form: Lung, AJCC 8th Edition - Clinical: Stage IV (cT4, cN3, cM1) - Signed by Loreatha Rodney, MD on 05/20/2023 Stage prefix: Initial diagnosis   06/01/2023 - 11/08/2023 Chemotherapy   Patient is on Treatment Plan : LUNG NSCLC Carboplatin  (6) + Paclitaxel  (200) + Pembrolizumab  (200) D1 q21d x 4 cycles / Pembrolizumab  (200) Maintenance D1 q21d     01/06/2024 -  Chemotherapy   Patient is on Treatment Plan : LUNG Carboplatin  + Paclitaxel  + XRT q7d       MEDICAL HISTORY:  Past Medical History:   Diagnosis Date   Abdominal pain 12/07/2022   Acute urinary retention 12/07/2022   Chest pain 01/24/2022   COPD (chronic obstructive pulmonary disease) (HCC)    GERD (gastroesophageal reflux disease)    Hemothorax on right 05/11/2023   Hyperlipidemia    Hypertension    Pleuritic pain 05/10/2023   Pneumothorax after biopsy 05/11/2023   Post procedure discomfort 05/13/2023   Pulmonary fibrosis (HCC) 11/2015    SURGICAL HISTORY: Past Surgical History:  Procedure Laterality Date   COLONOSCOPY     CORONARY STENT PLACEMENT     ESOPHAGOGASTRODUODENOSCOPY (EGD) WITH PROPOFOL  N/A 09/23/2016   Procedure: ESOPHAGOGASTRODUODENOSCOPY (EGD) WITH PROPOFOL ;  Surgeon: Luke Salaam, MD;  Location: [facility] ENDOSCOPY;  Service: Endoscopy;  Laterality: N/A;   IR IMAGING GUIDED PORT INSERTION  05/26/2023   IR IMAGING GUIDED PORT INSERTION  07/30/2023   IR RADIOLOGIST EVAL & MGMT  07/20/2023   KYPHOPLASTY N/A 03/14/2020   Procedure: T7 & T11 KYPHOPLASTY;  Surgeon: Molli Angelucci, MD;  Location: [facility] ORS;  Service: Orthopedics;  Laterality: N/A;   PORT-A-CATH REMOVAL N/A 06/14/2023   Procedure: REMOVAL PORT-A-CATH;  Surgeon: Conrado Delay, DO;  Location: [facility] ORS;  Service: General;  Laterality: N/A;   SHOULDER ACROMIOPLASTY  SOCIAL HISTORY: Social History   Socioeconomic History   Marital status: Divorced    Spouse name: Not on file   Number of children: Not on file   Years of education: Not on file   Highest education level: Not on file  Occupational History   Not on file  Tobacco Use   Smoking status: Former    Current packs/day: 0.00    Average packs/day: 2.0 packs/day for 50.0 years (100.0 ttl pk-yrs)    Types: Cigarettes    Start date: 10/13/1965    Quit date: 10/14/2015    Years since quitting: 8.3   Smokeless tobacco: Former  Building services engineer status: Never Used  Substance and Sexual Activity   Alcohol use: Yes    Alcohol/week: 56.0 standard drinks of alcohol    Types: 56 Cans of  beer per week    Comment: "I sit around and drink beer, that's all I got to do"   Drug use: No   Sexual activity: Not Currently  Other Topics Concern   Not on file  Social History Narrative   Not on file   Social Drivers of Health   Financial Resource Strain: Not on file  Food Insecurity: No Food Insecurity (12/29/2023)   Hunger Vital Sign    Worried About Running Out of Food in the Last Year: Never true    Ran Out of Food in the Last Year: Never true  Transportation Needs: Unmet Transportation Needs (01/27/2024)   PRAPARE - Administrator, Civil Service (Medical): Yes    Lack of Transportation (Non-Medical): Yes  Physical Activity: Not on file  Stress: Not on file  Social Connections: Socially Isolated (12/29/2023)   Social Connection and Isolation Panel [NHANES]    Frequency of Communication with Friends and Family: Three times a week    Frequency of Social Gatherings with Friends and Family: Once a week    Attends Religious Services: Never    Database administrator or Organizations: No    Attends Banker Meetings: Never    Marital Status: Divorced  Catering manager Violence: Not At Risk (12/29/2023)   Humiliation, Afraid, Rape, and Kick questionnaire    Fear of Current or Ex-Partner: No    Emotionally Abused: No    Physically Abused: No    Sexually Abused: No    FAMILY HISTORY: Family History  Problem Relation Age of Onset   Heart disease Mother     ALLERGIES:  is allergic to amoxicillin  and tizanidine .  MEDICATIONS:  Current Outpatient Medications  Medication Sig Dispense Refill   acetaminophen  (TYLENOL ) 500 MG tablet Take 1,000 mg by mouth every 6 (six) hours as needed for moderate pain or headache.     albuterol  (VENTOLIN  HFA) 108 (90 Base) MCG/ACT inhaler Inhale 2 puffs into the lungs every 6 (six) hours as needed for wheezing or shortness of breath. 8 g 11   aspirin  EC 81 MG tablet Take 1 tablet (81 mg total) by mouth daily. 90 tablet 3    atorvastatin  (LIPITOR) 40 MG tablet Take 1 tablet (40 mg total) by mouth at bedtime. 90 tablet 3   CALCIUM  600/VITAMIN D  600-10 MG-MCG TABS Take 1 tablet by mouth 2 (two) times daily.     clopidogrel  (PLAVIX ) 75 MG tablet Take 1 tablet (75 mg total) by mouth daily. 90 tablet 3   Dupilumab  (DUPIXENT ) 300 MG/2ML SOPN Inject 300 mg into the skin every 14 (fourteen) days. 12 mL 1  EPINEPHrine  0.3 mg/0.3 mL IJ SOAJ injection Inject 0.3 mg into the muscle as needed for anaphylaxis.     gabapentin  (NEURONTIN ) 300 MG capsule TAKE 1 CAPSULE BY MOUTH THREE TIMES A DAY 90 capsule 1   guaiFENesin -dextromethorphan  (ROBITUSSIN DM) 100-10 MG/5ML syrup Take 10 mLs by mouth every 6 (six) hours as needed for cough. 118 mL 0   ipratropium-albuterol  (DUONEB) 0.5-2.5 (3) MG/3ML SOLN INHALE 1 VIAL THROUGH NEBULIZER EVERY 6 HOURS (Patient taking differently: Take 3 mLs by nebulization every 6 (six) hours as needed.) 270 mL 2   lidocaine -prilocaine  (EMLA ) cream Apply to affected area once 30 g 3   magnesium  oxide (MAG-OX) 400 (241.3 Mg) MG tablet Take 1 tablet (400 mg total) by mouth daily. 30 tablet 0   metoprolol  succinate (TOPROL -XL) 50 MG 24 hr tablet Take 50 mg by mouth daily.     montelukast  (SINGULAIR ) 10 MG tablet Take 10 mg by mouth daily.     Multiple Vitamin (MULTIVITAMIN WITH MINERALS) TABS tablet Take 1 tablet by mouth daily. 30 tablet 0   omega-3 acid ethyl esters (LOVAZA) 1 g capsule Take 1 g by mouth daily.     oxyCODONE  (OXY IR/ROXICODONE ) 5 MG immediate release tablet Take 1 tablet (5 mg total) by mouth every 8 (eight) hours as needed for severe pain (pain score 7-10). 90 tablet 0   pantoprazole  (PROTONIX ) 40 MG tablet Take 1 tablet (40 mg total) by mouth 2 (two) times daily. 90 tablet 3   potassium chloride  SA (KLOR-CON  M) 20 MEQ tablet Take 20 mEq by mouth daily.     predniSONE  (DELTASONE ) 10 MG tablet TAKE 1 TABLET (10 MG TOTAL) BY MOUTH DAILY AS NEEDED. TAKE AS DIRECTED. 30 tablet 1    Spacer/Aero-Holding Chambers (AEROCHAMBER MV) inhaler Use as instructed 1 each 0   sucralfate  (CARAFATE ) 1 g tablet Take 1 tablet (1 g total) by mouth 3 (three) times daily before meals. Dissolve in 4tbs warm water , swish and swallow 90 tablet 4   amLODipine  (NORVASC ) 5 MG tablet Take 1 tablet (5 mg total) by mouth at bedtime. Skip the dose if systolic BP less than 130 mmHg 30 tablet 5   ferrous sulfate  325 (65 FE) MG tablet Take 1 tablet (325 mg total) by mouth 2 (two) times daily with a meal. (Patient not taking: Reported on 01/27/2024) 60 tablet 2   No current facility-administered medications for this visit.   Facility-Administered Medications Ordered in Other Visits  Medication Dose Route Frequency Provider Last Rate Last Admin   0.9 %  sodium chloride  infusion   Intravenous Continuous Jordani Nunn, MD 10 mL/hr at 02/17/24 1010 New Bag at 02/17/24 1010   CARBOplatin  (PARAPLATIN ) 210 mg in sodium chloride  0.9 % 100 mL chemo infusion  210 mg Intravenous Once Bently Morath, MD        REVIEW OF SYSTEMS:   Pertinent information mentioned in HPI All other systems were reviewed with the patient and are negative.  PHYSICAL EXAMINATION: ECOG PERFORMANCE STATUS: 3   Vitals:   02/17/24 0925  BP: (!) 129/96  Pulse: 81  Resp: 16  Temp: 98.1 F (36.7 C)  SpO2: 99%    Filed Weights   02/17/24 0925  Weight: 184 lb (83.5 kg)     GENERAL:alert, no distress and comfortable SKIN: skin color, texture, turgor are normal, no rashes or significant lesions EYES: normal, conjunctiva are pink and non-injected, sclera clear OROPHARYNX:no exudate, no erythema and lips, buccal mucosa, and tongue normal  NECK: supple,  thyroid  normal size, non-tender, without nodularity LYMPH:  no palpable lymphadenopathy in the cervical, axillary or inguinal LUNGS: clear to auscultation and percussion with normal breathing effort HEART: regular rate & rhythm and no murmurs and no lower extremity  edema ABDOMEN:abdomen soft, non-tender and normal bowel sounds Musculoskeletal:no cyanosis of digits and no clubbing  PSYCH: alert & oriented x 3 with fluent speech NEURO: no focal motor/sensory deficits  LABORATORY DATA:  I have reviewed the data as listed Lab Results  Component Value Date   WBC 3.5 (L) 02/17/2024   HGB 10.5 (L) 02/17/2024   HCT 32.2 (L) 02/17/2024   MCV 100.0 02/17/2024   PLT 252 02/17/2024   Recent Labs    06/16/23 0824 06/17/23 0329 01/20/24 0923 01/27/24 0955 02/17/24 0913  NA 133*   < > 134* 131* 134*  K 3.5   < > 3.5 3.9 4.1  CL 98   < > 97* 98 99  CO2 28   < > 28 26 26   GLUCOSE 152*   < > 137* 116* 115*  BUN 10   < > 12 12 13   CREATININE 0.55*   < > 0.70 0.69 0.67  CALCIUM  7.0*   < > 8.8* 8.1* 8.7*  GFRNONAA >60   < > >60 >60 >60  PROT 6.5   < > 6.6 6.3* 6.8  ALBUMIN 2.9*   < > 4.0 3.8 4.1  AST 18   < > 25 26 24   ALT 25   < > 22 24 17   ALKPHOS 87   < > 43 46 49  BILITOT 0.3   < > 0.6 0.5 0.6  BILIDIR <0.1  --   --   --   --   IBILI NOT CALCULATED  --   --   --   --    < > = values in this interval not displayed.    RADIOGRAPHIC STUDIES: I have personally reviewed the radiological images as listed and agreed with the findings in the report. No results found.

## 2024-02-18 ENCOUNTER — Other Ambulatory Visit: Payer: Self-pay

## 2024-02-18 ENCOUNTER — Ambulatory Visit
Admission: RE | Admit: 2024-02-18 | Discharge: 2024-02-18 | Disposition: A | Discharge status: Home / Self Care | Source: Ambulatory Visit | Attending: Radiation Oncology | Admitting: Radiation Oncology

## 2024-02-18 DIAGNOSIS — Z510 Encounter for antineoplastic radiation therapy: Secondary | ICD-10-CM | POA: Diagnosis not present

## 2024-02-18 LAB — RAD ONC ARIA SESSION SUMMARY
Course Elapsed Days: 3
Plan Fractions Treated to Date: 4
Plan Prescribed Dose Per Fraction: 2 Gy
Plan Total Fractions Prescribed: 15
Plan Total Prescribed Dose: 30 Gy
Reference Point Dosage Given to Date: 8 Gy
Reference Point Session Dosage Given: 2 Gy
Session Number: 4

## 2024-02-21 ENCOUNTER — Other Ambulatory Visit: Payer: Self-pay

## 2024-02-21 ENCOUNTER — Ambulatory Visit
Admission: RE | Admit: 2024-02-21 | Discharge: 2024-02-21 | Disposition: A | Discharge status: Home / Self Care | Source: Ambulatory Visit | Attending: Radiation Oncology | Admitting: Radiation Oncology

## 2024-02-21 DIAGNOSIS — Z510 Encounter for antineoplastic radiation therapy: Secondary | ICD-10-CM | POA: Diagnosis not present

## 2024-02-21 LAB — RAD ONC ARIA SESSION SUMMARY
Course Elapsed Days: 6
Plan Fractions Treated to Date: 5
Plan Prescribed Dose Per Fraction: 2 Gy
Plan Total Fractions Prescribed: 15
Plan Total Prescribed Dose: 30 Gy
Reference Point Dosage Given to Date: 10 Gy
Reference Point Session Dosage Given: 2 Gy
Session Number: 5

## 2024-02-22 ENCOUNTER — Other Ambulatory Visit: Payer: Self-pay

## 2024-02-22 ENCOUNTER — Ambulatory Visit
Admission: RE | Admit: 2024-02-22 | Discharge: 2024-02-22 | Disposition: A | Discharge status: Home / Self Care | Source: Ambulatory Visit | Attending: Radiation Oncology | Admitting: Radiation Oncology

## 2024-02-22 DIAGNOSIS — Z510 Encounter for antineoplastic radiation therapy: Secondary | ICD-10-CM | POA: Diagnosis not present

## 2024-02-22 LAB — RAD ONC ARIA SESSION SUMMARY
Course Elapsed Days: 7
Plan Fractions Treated to Date: 6
Plan Prescribed Dose Per Fraction: 2 Gy
Plan Total Fractions Prescribed: 15
Plan Total Prescribed Dose: 30 Gy
Reference Point Dosage Given to Date: 12 Gy
Reference Point Session Dosage Given: 2 Gy
Session Number: 6

## 2024-02-23 ENCOUNTER — Other Ambulatory Visit: Payer: Self-pay

## 2024-02-23 ENCOUNTER — Ambulatory Visit
Admission: RE | Admit: 2024-02-23 | Discharge: 2024-02-23 | Disposition: A | Discharge status: Home / Self Care | Source: Ambulatory Visit | Attending: Radiation Oncology | Admitting: Radiation Oncology

## 2024-02-23 DIAGNOSIS — Z510 Encounter for antineoplastic radiation therapy: Secondary | ICD-10-CM | POA: Diagnosis not present

## 2024-02-23 LAB — RAD ONC ARIA SESSION SUMMARY
Course Elapsed Days: 8
Plan Fractions Treated to Date: 7
Plan Prescribed Dose Per Fraction: 2 Gy
Plan Total Fractions Prescribed: 15
Plan Total Prescribed Dose: 30 Gy
Reference Point Dosage Given to Date: 14 Gy
Reference Point Session Dosage Given: 2 Gy
Session Number: 7

## 2024-02-24 ENCOUNTER — Inpatient Hospital Stay

## 2024-02-24 ENCOUNTER — Ambulatory Visit: Admitting: Radiation Oncology

## 2024-02-24 ENCOUNTER — Ambulatory Visit
Admission: RE | Admit: 2024-02-24 | Discharge: 2024-02-24 | Disposition: A | Discharge status: Home / Self Care | Source: Ambulatory Visit | Attending: Radiation Oncology | Admitting: Radiation Oncology

## 2024-02-24 ENCOUNTER — Inpatient Hospital Stay: Admitting: Internal Medicine

## 2024-02-24 ENCOUNTER — Encounter

## 2024-02-24 ENCOUNTER — Encounter: Payer: Self-pay | Admitting: Internal Medicine

## 2024-02-24 ENCOUNTER — Other Ambulatory Visit: Payer: Self-pay

## 2024-02-24 VITALS — BP 119/88 | HR 81 | Temp 97.3°F | Resp 14 | Wt 183.0 lb

## 2024-02-24 VITALS — BP 158/87

## 2024-02-24 DIAGNOSIS — C3491 Malignant neoplasm of unspecified part of right bronchus or lung: Secondary | ICD-10-CM

## 2024-02-24 DIAGNOSIS — Z5111 Encounter for antineoplastic chemotherapy: Secondary | ICD-10-CM | POA: Diagnosis not present

## 2024-02-24 DIAGNOSIS — T451X5A Adverse effect of antineoplastic and immunosuppressive drugs, initial encounter: Secondary | ICD-10-CM | POA: Diagnosis not present

## 2024-02-24 DIAGNOSIS — D6481 Anemia due to antineoplastic chemotherapy: Secondary | ICD-10-CM

## 2024-02-24 DIAGNOSIS — Z510 Encounter for antineoplastic radiation therapy: Secondary | ICD-10-CM | POA: Diagnosis not present

## 2024-02-24 LAB — RAD ONC ARIA SESSION SUMMARY
Course Elapsed Days: 9
Plan Fractions Treated to Date: 8
Plan Prescribed Dose Per Fraction: 2 Gy
Plan Total Fractions Prescribed: 15
Plan Total Prescribed Dose: 30 Gy
Reference Point Dosage Given to Date: 16 Gy
Reference Point Session Dosage Given: 2 Gy
Session Number: 8

## 2024-02-24 LAB — CBC WITH DIFFERENTIAL (CANCER CENTER ONLY)
Abs Immature Granulocytes: 0.04 10*3/uL (ref 0.00–0.07)
Basophils Absolute: 0 10*3/uL (ref 0.0–0.1)
Basophils Relative: 1 %
Eosinophils Absolute: 0.1 10*3/uL (ref 0.0–0.5)
Eosinophils Relative: 1 %
HCT: 28.6 % — ABNORMAL LOW (ref 39.0–52.0)
Hemoglobin: 9.6 g/dL — ABNORMAL LOW (ref 13.0–17.0)
Immature Granulocytes: 1 %
Lymphocytes Relative: 17 %
Lymphs Abs: 0.7 10*3/uL (ref 0.7–4.0)
MCH: 33 pg (ref 26.0–34.0)
MCHC: 33.6 g/dL (ref 30.0–36.0)
MCV: 98.3 fL (ref 80.0–100.0)
Monocytes Absolute: 0.5 10*3/uL (ref 0.1–1.0)
Monocytes Relative: 11 %
Neutro Abs: 2.9 10*3/uL (ref 1.7–7.7)
Neutrophils Relative %: 69 %
Platelet Count: 186 10*3/uL (ref 150–400)
RBC: 2.91 MIL/uL — ABNORMAL LOW (ref 4.22–5.81)
RDW: 18.2 % — ABNORMAL HIGH (ref 11.5–15.5)
WBC Count: 4.2 10*3/uL (ref 4.0–10.5)
nRBC: 0 % (ref 0.0–0.2)

## 2024-02-24 LAB — CMP (CANCER CENTER ONLY)
ALT: 25 U/L (ref 0–44)
AST: 27 U/L (ref 15–41)
Albumin: 3.8 g/dL (ref 3.5–5.0)
Alkaline Phosphatase: 51 U/L (ref 38–126)
Anion gap: 9 (ref 5–15)
BUN: 11 mg/dL (ref 8–23)
CO2: 28 mmol/L (ref 22–32)
Calcium: 8.5 mg/dL — ABNORMAL LOW (ref 8.9–10.3)
Chloride: 97 mmol/L — ABNORMAL LOW (ref 98–111)
Creatinine: 0.44 mg/dL — ABNORMAL LOW (ref 0.61–1.24)
GFR, Estimated: >60 mL/min (ref >60)
Glucose, Bld: 116 mg/dL — ABNORMAL HIGH (ref 70–99)
Potassium: 3.7 mmol/L (ref 3.5–5.1)
Sodium: 134 mmol/L — ABNORMAL LOW (ref 135–145)
Total Bilirubin: 0.7 mg/dL (ref 0.0–1.2)
Total Protein: 6.4 g/dL — ABNORMAL LOW (ref 6.5–8.1)

## 2024-02-24 MED ORDER — FAMOTIDINE IN NACL 20-0.9 MG/50ML-% IV SOLN
20.0000 mg | Freq: Once | INTRAVENOUS | Status: AC
  Administered 2024-02-24: 20 mg via INTRAVENOUS
  Filled 2024-02-24: qty 50

## 2024-02-24 MED ORDER — PALONOSETRON HCL INJECTION 0.25 MG/5ML
0.2500 mg | Freq: Once | INTRAVENOUS | Status: AC
  Administered 2024-02-24: 0.25 mg via INTRAVENOUS
  Filled 2024-02-24: qty 5

## 2024-02-24 MED ORDER — HEPARIN SOD (PORK) LOCK FLUSH 100 UNIT/ML IV SOLN
500.0000 [IU] | Freq: Once | INTRAVENOUS | Status: AC | PRN
  Administered 2024-02-24: 500 [IU]
  Filled 2024-02-24: qty 5

## 2024-02-24 MED ORDER — DEXAMETHASONE SODIUM PHOSPHATE 10 MG/ML IJ SOLN
10.0000 mg | Freq: Once | INTRAMUSCULAR | Status: AC
  Administered 2024-02-24: 10 mg via INTRAVENOUS
  Filled 2024-02-24: qty 1

## 2024-02-24 MED ORDER — CETIRIZINE HCL 10 MG/ML IV SOLN
10.0000 mg | Freq: Once | INTRAVENOUS | Status: AC
  Administered 2024-02-24: 10 mg via INTRAVENOUS
  Filled 2024-02-24: qty 1

## 2024-02-24 MED ORDER — SODIUM CHLORIDE 0.9 % IV SOLN
INTRAVENOUS | Status: DC
  Filled 2024-02-24: qty 250

## 2024-02-24 MED ORDER — SODIUM CHLORIDE 0.9 % IV SOLN
213.2000 mg | Freq: Once | INTRAVENOUS | Status: AC
  Administered 2024-02-24: 210 mg via INTRAVENOUS
  Filled 2024-02-24: qty 21

## 2024-02-24 MED ORDER — SODIUM CHLORIDE 0.9 % IV SOLN
45.0000 mg/m2 | Freq: Once | INTRAVENOUS | Status: AC
  Administered 2024-02-24: 90 mg via INTRAVENOUS
  Filled 2024-02-24: qty 15

## 2024-02-24 NOTE — Progress Notes (Signed)
 Patient is doing ok, he still has a bad cough but he feels like it has got a little better. He is coughing up some mucus, which he says it happens after he uses his nebulizer. He told me that Dr. Maida Sciara told him that he has more air movement in his lungs now.

## 2024-02-24 NOTE — Patient Instructions (Signed)
 [facility] - A DEPT OF [facility]  Discharge Instructions: Thank you for choosing [facility] to provide your oncology and hematology care.  If you have a lab appointment with the Cancer Center, please go directly to the Cancer Center and check in at the registration area.  Wear comfortable clothing and clothing appropriate for easy access to any Portacath or PICC line.   We strive to give you quality time with your provider. You may need to reschedule your appointment if you arrive late (15 or more minutes).  Arriving late affects you and other patients whose appointments are after yours.  Also, if you miss three or more appointments without notifying the office, you may be dismissed from the clinic at the provider's discretion.      For prescription refill requests, have your pharmacy contact our office and allow 72 hours for refills to be completed.     To help prevent nausea and vomiting after your treatment, we encourage you to take your nausea medication as directed.  BELOW ARE SYMPTOMS THAT SHOULD BE REPORTED IMMEDIATELY: *FEVER GREATER THAN 100.4 F (38 C) OR HIGHER *CHILLS OR SWEATING *NAUSEA AND VOMITING THAT IS NOT CONTROLLED WITH YOUR NAUSEA MEDICATION *UNUSUAL SHORTNESS OF BREATH *UNUSUAL BRUISING OR BLEEDING *URINARY PROBLEMS (pain or burning when urinating, or frequent urination) *BOWEL PROBLEMS (unusual diarrhea, constipation, pain near the anus) TENDERNESS IN MOUTH AND THROAT WITH OR WITHOUT PRESENCE OF ULCERS (sore throat, sores in mouth, or a toothache) UNUSUAL RASH, SWELLING OR PAIN   Items with * indicate a potential emergency and should be followed up as soon as possible or go to the Emergency Department if any problems should occur.  Please show the CHEMOTHERAPY ALERT CARD or IMMUNOTHERAPY ALERT CARD at check-in to the Emergency Department and triage nurse.  Should you have questions after your visit or need to  cancel or reschedule your appointment, please contact [facility] - A DEPT OF [facility]  [phone] and follow the prompts.  Office hours are 8:00 a.m. to 4:30 p.m. Monday - Friday. Please note that voicemails left after 4:00 p.m. may not be returned until the following business day.  We are closed weekends and major holidays. You have access to a nurse at all times for urgent questions. Please call the main number to the clinic [phone] and follow the prompts.  For any non-urgent questions, you may also contact your provider using MyChart. We now offer e-Visits for anyone 64 and older to request care online for non-urgent symptoms. For details visit [email or web address].   Also download the MyChart app! Go to the app store, search "MyChart", open the app, select [facility], and log in with your MyChart username and password.

## 2024-02-24 NOTE — Progress Notes (Signed)
 [facility] CONSULT NOTE  Patient Care Team: Jefferey Minerva, PA as PCP - General (Physician Assistant) Constancia Delton, MD as PCP - Cardiology (Cardiology) Marc Senior, MD as Consulting Physician (Pulmonary Disease) Drake Gens, RN as Oncology Nurse Navigator Loreatha Rodney, MD as Consulting Physician (Oncology) Glenis Langdon, MD as Consulting Physician (Radiation Oncology)   CANCER STAGING   Cancer Staging  Stage IV adenocarcinoma of lung [facility]) Staging form: Lung, AJCC 8th Edition - Clinical: Stage IV (cT4, cN3, cM1) - Signed by Ozias Dicenzo, MD on 05/20/2023 Stage prefix: Initial diagnosis   ASSESSMENT & PLAN:  Austin Patterson 68 y.o. 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  Pathology showed poorly differentiated carcinoma with spindle cell component.  Tumor cells positive for CK and focal TTF-1.  The differential diagnosis includes pleomorphic carcinoma and carcinosarcoma.  Definitive classification cannot be performed on the biopsy specimen.  -Tempus testing from August 2024 showed PD-L1 0% with no targetable mutations.  -[id number] from Feb 2025 - no targetable mutations.  -Proceeded with systemic treatment with concern for involvement of upper abdominal lymph nodes and pulmonary metastasis.  s/p dose reduced carboplatin , Taxol  and Keytruda  on 06/01/2023 x 4 cycles.  Poor functional status/ poor  tolerance with two hospitalizations for sepsis/ and second for weakness.  On Keytruda  maintenance until February 2025.  On PET imaging, progression of the disease in the right middle lobe lung mass and left lower lobe nodule.  Bilateral hilar, mediastinal and upper abdominal lymphadenopathy overall stable.  -Started on concurrent chemo RT on 01/05/2024.  Labs reviewed and acceptable for treatment.  Will proceed with weekly cycle 6 of CarboTaxol.  He will follow-up with Dr. Wilhelmenia Harada next week for cycle 7 of weekly CarboTaxol and will complete radiation next week also.  Then may consider consolidation with dose reduced carboplatin /gemcitabine or just single agent gemcitabine.  Will schedule CT imaging end of May 2025.    # Chronic respiratory failure on 2 L oxygen  # Asthma and COPD overlap # Idiopathic pulmonary fibrosis -Management per pulmonary.  On Dupixent   # Hypocalcemia -Vitamin D  level is normal -Continue with calcium  vitamin D  supplements.  # Hyponatremia -Chronic in nature.  # Normocytic anemia -History of iron  deficiency.  Completed IV Venofer  x 5 doses.   -Recent iron  panel is normal.  Hemoglobin 9.9 likely secondary to chemo RT.  # Cancer related pain -In mid chest.  Continue with oxycodone  5 mg Q8 as needed.   #CAD- on aspirin , plavix   # IV access-port removed on 06/14/23 due to sepsis.   -Inserted on left side on 07/30/23.  Orders Placed This Encounter  Procedures   CT CHEST ABDOMEN PELVIS W CONTRAST    Standing Status:   Future    Expected Date:   03/13/2024    Expiration Date:   02/23/2025    If indicated for the ordered procedure, I authorize the administration of contrast media per Radiology protocol:   Yes    Does the patient have a contrast media/X-ray dye  allergy?:   No    Preferred imaging location?:   [facility]    If indicated for the ordered procedure, I authorize the administration of oral contrast media per Radiology protocol:   Yes   RTC in 1 week for MD  visit, labs, cycle 7 carbo Taxol .  The total time spent in the appointment was 30 minutes encounter with patients including review of chart and various tests results, discussions about plan of care and coordination of care plan   All questions were answered. The patient knows to call the clinic with any problems, questions or concerns. No barriers to learning was detected.  Loreatha Rodney, MD 5/8/202510:00 AM   HISTORY OF PRESENTING ILLNESS:  Austin Patterson 68 y.o. 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  Cannot rule out sarcomatoid component.  He is a remote smoker quit in 2016.  Prior smoked 2 packs a day since age 65.  He is on 2 L oxygen  since 2017.  Lives alone.  Was in a wheelchair.  Does not have family close by.  Follows with Dr. Viva Grise.   Interval history Patient was seen today as follow-up prior to cycle 6 of CarboTaxol. He is tolerating treatment reasonably well.  Reports some mucus on and off.  Uses Robitussin as needed.  Shortness of breath varies depending on the weather conditions. Esophagitis is under control with sucralfate  and Protonix .  I have reviewed his chart and materials related to his cancer extensively and collaborated history with the patient. Summary of oncologic history is as follows:  Oncology History  Stage IV adenocarcinoma of lung (HCC)  05/18/2023 Initial Diagnosis   Sarcomatoid carcinoma of lung (HCC)   05/20/2023 Cancer Staging   Staging form: Lung, AJCC 8th Edition - Clinical: Stage IV (cT4, cN3, cM1) - Signed by Loreatha Rodney, MD on 05/20/2023 Stage prefix: Initial diagnosis   06/01/2023 - 11/08/2023 Chemotherapy   Patient is on Treatment Plan : LUNG NSCLC Carboplatin  (6) + Paclitaxel  (200) + Pembrolizumab  (200) D1 q21d x 4 cycles / Pembrolizumab  (200) Maintenance D1 q21d     01/06/2024 -  Chemotherapy   Patient is on Treatment Plan : LUNG Carboplatin  + Paclitaxel  + XRT q7d       MEDICAL HISTORY:  Past Medical History:  Diagnosis Date   Abdominal pain 12/07/2022   Acute urinary retention 12/07/2022   Chest pain 01/24/2022   COPD (chronic obstructive pulmonary disease) (HCC)    GERD (gastroesophageal reflux disease)    Hemothorax on right 05/11/2023   Hyperlipidemia    Hypertension    Pleuritic pain 05/10/2023   Pneumothorax after biopsy 05/11/2023   Post procedure discomfort 05/13/2023   Pulmonary fibrosis (HCC) 11/2015    SURGICAL HISTORY: Past Surgical History:  Procedure Laterality Date   COLONOSCOPY     CORONARY STENT PLACEMENT     ESOPHAGOGASTRODUODENOSCOPY (EGD) WITH PROPOFOL  N/A 09/23/2016   Procedure: ESOPHAGOGASTRODUODENOSCOPY (EGD) WITH PROPOFOL ;  Surgeon: Luke Salaam, MD;  Location: [facility] END[facility];  Service: Endoscopy;  Laterality: N/A;   IR IMAGING GUIDED PORT INSERTION  05/26/2023   IR IMAGING GUIDED PORT INSERTION  07/30/2023   IR RADIOLOGIST EVAL & MGMT  07/20/2023   KYPHOPLASTY N/A 03/14/2020   Procedure: T7 & T11 KYPHOPLASTY;  Surgeon: Molli Angelucci, MD;  Location: [facility] ORS;  Service: Orthopedics;  Laterality: N/A;   PORT-A-CATH REMOVAL N/A 06/14/2023   Procedure: REMOVAL PORT-A-CATH;  Surgeon: Conrado Delay, DO;  Location: [facility] ORS;  Service: General;  Laterality: N/A;   SHOULDER ACROMIOPLASTY      SOCIAL HISTORY: Social History   Socioeconomic History   Marital status: Divorced    Spouse name: Not on file   Number of children: Not on file   Years of education: Not on file   Highest education level: Not on file  Occupational History   Not on file  Tobacco Use   Smoking  status: Former    Current packs/day: 0.00    Average packs/day: 2.0 packs/day for 50.0 years (100.0 ttl pk-yrs)    Types: Cigarettes    Start date: 10/13/1965    Quit date: 10/14/2015    Years since quitting: 8.3   Smokeless tobacco: Former  Building services engineer status: Never Used  Substance and Sexual Activity   Alcohol use: Yes    Alcohol/week: 56.0 standard drinks of alcohol    Types: 56 Cans of beer per week    Comment: "I sit around and drink beer, that's all I got to do"   Drug use: No   Sexual activity: Not Currently  Other Topics Concern   Not on file  Social History Narrative   Not on file   Social Drivers of Health   Financial Resource Strain: Not on file  Food Insecurity: No Food Insecurity (12/29/2023)   Hunger Vital Sign    Worried About Running Out of Food in the Last Year: Never true    Ran Out of Food in the Last Year: Never true  Transportation Needs: Unmet Transportation Needs (01/27/2024)   PRAPARE - Administrator, Civil Service (Medical): Yes    Lack of Transportation (Non-Medical): Yes  Physical Activity: Not on file  Stress: Not on file  Social Connections: Socially Isolated (12/29/2023)   Social Connection and Isolation Panel [NHANES]    Frequency of Communication with Friends and Family: Three times a week    Frequency of Social Gatherings with Friends and Family: Once a week    Attends Religious Services: Never    Database administrator or Organizations: No    Attends Banker Meetings: Never    Marital Status: Divorced  Catering manager Violence: Not At Risk (12/29/2023)   Humiliation, Afraid, Rape, and Kick questionnaire    Fear of Current or Ex-Partner: No    Emotionally Abused: No    Physically Abused: No    Sexually Abused: No    FAMILY HISTORY: Family History  Problem Relation Age of Onset   Heart disease Mother     ALLERGIES:  is allergic to amoxicillin  and tizanidine .  MEDICATIONS:  Current Outpatient  Medications  Medication Sig Dispense Refill   acetaminophen  (TYLENOL ) 500 MG tablet Take 1,000 mg by mouth every 6 (six) hours as needed for moderate pain or headache.     albuterol  (VENTOLIN  HFA) 108 (90 Base) MCG/ACT inhaler Inhale 2 puffs into the lungs every 6 (six) hours as needed for wheezing or shortness of breath. 8 g 11   amLODipine  (NORVASC ) 5 MG tablet Take 1 tablet (5 mg total) by mouth at bedtime. Skip the dose if systolic BP less than 130 mmHg 30 tablet 5   aspirin  EC 81 MG  tablet Take 1 tablet (81 mg total) by mouth daily. 90 tablet 3   atorvastatin  (LIPITOR) 40 MG tablet Take 1 tablet (40 mg total) by mouth at bedtime. 90 tablet 3   CALCIUM  600/VITAMIN D  600-10 MG-MCG TABS Take 1 tablet by mouth 2 (two) times daily.     clopidogrel  (PLAVIX ) 75 MG tablet Take 1 tablet (75 mg total) by mouth daily. 90 tablet 3   Dupilumab  (DUPIXENT ) 300 MG/2ML SOPN Inject 300 mg into the skin every 14 (fourteen) days. 12 mL 1   EPINEPHrine  0.3 mg/0.3 mL IJ SOAJ injection Inject 0.3 mg into the muscle as needed for anaphylaxis.     ferrous sulfate  325 (65 FE) MG tablet Take 1 tablet (325 mg total) by mouth 2 (two) times daily with a meal. (Patient not taking: Reported on 01/27/2024) 60 tablet 2   gabapentin  (NEURONTIN ) 300 MG capsule TAKE 1 CAPSULE BY MOUTH THREE TIMES A DAY 90 capsule 1   guaiFENesin -dextromethorphan  (ROBITUSSIN DM) 100-10 MG/5ML syrup Take 10 mLs by mouth every 6 (six) hours as needed for cough. 118 mL 0   ipratropium-albuterol  (DUONEB) 0.5-2.5 (3) MG/3ML SOLN INHALE 1 VIAL THROUGH NEBULIZER EVERY 6 HOURS (Patient taking differently: Take 3 mLs by nebulization every 6 (six) hours as needed.) 270 mL 2   lidocaine -prilocaine  (EMLA ) cream Apply to affected area once 30 g 3   magnesium  oxide (MAG-OX) 400 (241.3 Mg) MG tablet Take 1 tablet (400 mg total) by mouth daily. 30 tablet 0   metoprolol  succinate (TOPROL -XL) 50 MG 24 hr tablet Take 50 mg by mouth daily.     montelukast  (SINGULAIR )  10 MG tablet Take 10 mg by mouth daily.     Multiple Vitamin (MULTIVITAMIN WITH MINERALS) TABS tablet Take 1 tablet by mouth daily. 30 tablet 0   omega-3 acid ethyl esters (LOVAZA) 1 g capsule Take 1 g by mouth daily.     oxyCODONE  (OXY IR/ROXICODONE ) 5 MG immediate release tablet Take 1 tablet (5 mg total) by mouth every 8 (eight) hours as needed for severe pain (pain score 7-10). 90 tablet 0   pantoprazole  (PROTONIX ) 40 MG tablet Take 1 tablet (40 mg total) by mouth 2 (two) times daily. 90 tablet 3   potassium chloride  SA (KLOR-CON  M) 20 MEQ tablet Take 20 mEq by mouth daily.     predniSONE  (DELTASONE ) 10 MG tablet TAKE 1 TABLET (10 MG TOTAL) BY MOUTH DAILY AS NEEDED. TAKE AS DIRECTED. 30 tablet 1   Spacer/Aero-Holding Chambers (AEROCHAMBER MV) inhaler Use as instructed 1 each 0   sucralfate  (CARAFATE ) 1 g tablet Take 1 tablet (1 g total) by mouth 3 (three) times daily before meals. Dissolve in 4tbs warm water , swish and swallow 90 tablet 4   No current facility-administered medications for this visit.   Facility-Administered Medications Ordered in Other Visits  Medication Dose Route Frequency Provider Last Rate Last Admin   0.9 %  sodium chloride  infusion   Intravenous Continuous Calem Cocozza, MD       CARBOplatin  (PARAPLATIN ) 210 mg in sodium chloride  0.9 % 100 mL chemo infusion  210 mg Intravenous Once Temitope Griffing, MD       cetirizine  (QUZYTTIR ) injection 10 mg  10 mg Intravenous Once Laci Frenkel, MD       dexamethasone  (DECADRON ) injection 10 mg  10 mg Intravenous Once Khiree Bukhari, MD       famotidine  (PEPCID ) IVPB 20 mg premix  20 mg Intravenous Once Ekta Dancer, MD  heparin  lock flush 100 unit/mL  500 Units Intracatheter Once PRN Sherie Dobrowolski, MD       PACLitaxel  (TAXOL ) 90 mg in sodium chloride  0.9 % 250 mL chemo infusion (</= 80mg /m2)  45 mg/m2 (Treatment Plan Recorded) Intravenous Once Lynx Goodrich, MD       palonosetron  (ALOXI ) injection 0.25 mg  0.25 mg  Intravenous Once Pleshette Tomasini, MD        REVIEW OF SYSTEMS:   Pertinent information mentioned in HPI All other systems were reviewed with the patient and are negative.  PHYSICAL EXAMINATION: ECOG PERFORMANCE STATUS: 3   Vitals:   02/24/24 0904  BP: 119/88  Pulse: 81  Resp: 14  Temp: (!) 97.3 F (36.3 C)  SpO2: 100%    Filed Weights   02/24/24 0904  Weight: 183 lb (83 kg)     GENERAL:alert, no distress and comfortable SKIN: skin color, texture, turgor are normal, no rashes or significant lesions EYES: normal, conjunctiva are pink and non-injected, sclera clear OROPHARYNX:no exudate, no erythema and lips, buccal mucosa, and tongue normal  NECK: supple, thyroid  normal size, non-tender, without nodularity LYMPH:  no palpable lymphadenopathy in the cervical, axillary or inguinal LUNGS: clear to auscultation and percussion with normal breathing effort HEART: regular rate & rhythm and no murmurs and no lower extremity edema ABDOMEN:abdomen soft, non-tender and normal bowel sounds Musculoskeletal:no cyanosis of digits and no clubbing  PSYCH: alert & oriented x 3 with fluent speech NEURO: no focal motor/sensory deficits  LABORATORY DATA:  I have reviewed the data as listed Lab Results  Component Value Date   WBC 4.2 02/24/2024   HGB 9.6 (L) 02/24/2024   HCT 28.6 (L) 02/24/2024   MCV 98.3 02/24/2024   PLT 186 02/24/2024   Recent Labs    06/16/23 0824 06/17/23 0329 01/27/24 0955 02/17/24 0913 02/24/24 0844  NA 133*   < > 131* 134* 134*  K 3.5   < > 3.9 4.1 3.7  CL 98   < > 98 99 97*  CO2 28   < > 26 26 28   GLUCOSE 152*   < > 116* 115* 116*  BUN 10   < > 12 13 11   CREATININE 0.55*   < > 0.69 0.67 0.44*  CALCIUM  7.0*   < > 8.1* 8.7* 8.5*  GFRNONAA >60   < > >60 >60 >60  PROT 6.5   < > 6.3* 6.8 6.4*  ALBUMIN 2.9*   < > 3.8 4.1 3.8  AST 18   < > 26 24 27   ALT 25   < > 24 17 25   ALKPHOS 87   < > 46 49 51  BILITOT 0.3   < > 0.5 0.6 0.7  BILIDIR <0.1  --   --    --   --   IBILI NOT CALCULATED  --   --   --   --    < > = values in this interval not displayed.    RADIOGRAPHIC STUDIES: I have personally reviewed the radiological images as listed and agreed with the findings in the report. No results found.

## 2024-02-25 ENCOUNTER — Ambulatory Visit
Admission: RE | Admit: 2024-02-25 | Discharge: 2024-02-25 | Disposition: A | Discharge status: Home / Self Care | Source: Ambulatory Visit | Attending: Radiation Oncology | Admitting: Radiation Oncology

## 2024-02-25 ENCOUNTER — Other Ambulatory Visit: Payer: Self-pay

## 2024-02-25 DIAGNOSIS — Z510 Encounter for antineoplastic radiation therapy: Secondary | ICD-10-CM | POA: Diagnosis not present

## 2024-02-25 LAB — RAD ONC ARIA SESSION SUMMARY
Course Elapsed Days: 10
Plan Fractions Treated to Date: 9
Plan Prescribed Dose Per Fraction: 2 Gy
Plan Total Fractions Prescribed: 15
Plan Total Prescribed Dose: 30 Gy
Reference Point Dosage Given to Date: 18 Gy
Reference Point Session Dosage Given: 2 Gy
Session Number: 9

## 2024-02-28 ENCOUNTER — Other Ambulatory Visit: Payer: Self-pay

## 2024-02-28 ENCOUNTER — Ambulatory Visit
Admission: RE | Admit: 2024-02-28 | Discharge: 2024-02-28 | Disposition: A | Discharge status: Home / Self Care | Source: Ambulatory Visit | Attending: Radiation Oncology | Admitting: Radiation Oncology

## 2024-02-28 DIAGNOSIS — Z510 Encounter for antineoplastic radiation therapy: Secondary | ICD-10-CM | POA: Diagnosis not present

## 2024-02-28 LAB — RAD ONC ARIA SESSION SUMMARY
Course Elapsed Days: 13
Plan Fractions Treated to Date: 10
Plan Prescribed Dose Per Fraction: 2 Gy
Plan Total Fractions Prescribed: 15
Plan Total Prescribed Dose: 30 Gy
Reference Point Dosage Given to Date: 20 Gy
Reference Point Session Dosage Given: 2 Gy
Session Number: 10

## 2024-02-29 ENCOUNTER — Ambulatory Visit
Admission: RE | Admit: 2024-02-29 | Discharge: 2024-02-29 | Disposition: A | Discharge status: Home / Self Care | Source: Ambulatory Visit | Attending: Radiation Oncology | Admitting: Radiation Oncology

## 2024-02-29 ENCOUNTER — Other Ambulatory Visit: Payer: Self-pay

## 2024-02-29 DIAGNOSIS — Z510 Encounter for antineoplastic radiation therapy: Secondary | ICD-10-CM | POA: Diagnosis not present

## 2024-02-29 LAB — RAD ONC ARIA SESSION SUMMARY
Course Elapsed Days: 14
Plan Fractions Treated to Date: 11
Plan Prescribed Dose Per Fraction: 2 Gy
Plan Total Fractions Prescribed: 15
Plan Total Prescribed Dose: 30 Gy
Reference Point Dosage Given to Date: 22 Gy
Reference Point Session Dosage Given: 2 Gy
Session Number: 11

## 2024-03-01 ENCOUNTER — Ambulatory Visit
Admission: RE | Admit: 2024-03-01 | Discharge: 2024-03-01 | Disposition: A | Discharge status: Home / Self Care | Source: Ambulatory Visit | Attending: Radiation Oncology | Admitting: Radiation Oncology

## 2024-03-01 ENCOUNTER — Other Ambulatory Visit: Payer: Self-pay

## 2024-03-01 DIAGNOSIS — C3491 Malignant neoplasm of unspecified part of right bronchus or lung: Secondary | ICD-10-CM

## 2024-03-01 DIAGNOSIS — Z510 Encounter for antineoplastic radiation therapy: Secondary | ICD-10-CM | POA: Diagnosis not present

## 2024-03-01 LAB — RAD ONC ARIA SESSION SUMMARY
Course Elapsed Days: 15
Plan Fractions Treated to Date: 12
Plan Prescribed Dose Per Fraction: 2 Gy
Plan Total Fractions Prescribed: 15
Plan Total Prescribed Dose: 30 Gy
Reference Point Dosage Given to Date: 24 Gy
Reference Point Session Dosage Given: 2 Gy
Session Number: 12

## 2024-03-02 ENCOUNTER — Ambulatory Visit: Admitting: Oncology

## 2024-03-02 ENCOUNTER — Inpatient Hospital Stay

## 2024-03-02 ENCOUNTER — Other Ambulatory Visit: Payer: Self-pay

## 2024-03-02 ENCOUNTER — Inpatient Hospital Stay: Admitting: Oncology

## 2024-03-02 ENCOUNTER — Ambulatory Visit
Admission: RE | Admit: 2024-03-02 | Discharge: 2024-03-02 | Disposition: A | Discharge status: Home / Self Care | Source: Ambulatory Visit | Attending: Radiation Oncology | Admitting: Radiation Oncology

## 2024-03-02 ENCOUNTER — Encounter: Payer: Self-pay | Admitting: Oncology

## 2024-03-02 VITALS — BP 124/61 | HR 88

## 2024-03-02 VITALS — BP 120/81 | HR 92 | Temp 96.1°F | Resp 18 | Wt 182.0 lb

## 2024-03-02 DIAGNOSIS — C3491 Malignant neoplasm of unspecified part of right bronchus or lung: Secondary | ICD-10-CM

## 2024-03-02 DIAGNOSIS — D649 Anemia, unspecified: Secondary | ICD-10-CM

## 2024-03-02 DIAGNOSIS — Z5111 Encounter for antineoplastic chemotherapy: Secondary | ICD-10-CM | POA: Diagnosis not present

## 2024-03-02 DIAGNOSIS — Z510 Encounter for antineoplastic radiation therapy: Secondary | ICD-10-CM | POA: Diagnosis not present

## 2024-03-02 DIAGNOSIS — J4489 Other specified chronic obstructive pulmonary disease: Secondary | ICD-10-CM

## 2024-03-02 LAB — RAD ONC ARIA SESSION SUMMARY
Course Elapsed Days: 16
Plan Fractions Treated to Date: 13
Plan Prescribed Dose Per Fraction: 2 Gy
Plan Total Fractions Prescribed: 15
Plan Total Prescribed Dose: 30 Gy
Reference Point Dosage Given to Date: 26 Gy
Reference Point Session Dosage Given: 2 Gy
Session Number: 13

## 2024-03-02 LAB — CMP (CANCER CENTER ONLY)
ALT: 26 U/L (ref 0–44)
AST: 28 U/L (ref 15–41)
Albumin: 4.1 g/dL (ref 3.5–5.0)
Alkaline Phosphatase: 54 U/L (ref 38–126)
Anion gap: 10 (ref 5–15)
BUN: 14 mg/dL (ref 8–23)
CO2: 24 mmol/L (ref 22–32)
Calcium: 8.8 mg/dL — ABNORMAL LOW (ref 8.9–10.3)
Chloride: 99 mmol/L (ref 98–111)
Creatinine: 0.73 mg/dL (ref 0.61–1.24)
GFR, Estimated: >60 mL/min (ref >60)
Glucose, Bld: 120 mg/dL — ABNORMAL HIGH (ref 70–99)
Potassium: 4 mmol/L (ref 3.5–5.1)
Sodium: 133 mmol/L — ABNORMAL LOW (ref 135–145)
Total Bilirubin: 0.7 mg/dL (ref 0.0–1.2)
Total Protein: 6.7 g/dL (ref 6.5–8.1)

## 2024-03-02 LAB — CBC WITH DIFFERENTIAL (CANCER CENTER ONLY)
Abs Immature Granulocytes: 0.02 10*3/uL (ref 0.00–0.07)
Basophils Absolute: 0 10*3/uL (ref 0.0–0.1)
Basophils Relative: 1 %
Eosinophils Absolute: 0 10*3/uL (ref 0.0–0.5)
Eosinophils Relative: 1 %
HCT: 29 % — ABNORMAL LOW (ref 39.0–52.0)
Hemoglobin: 9.8 g/dL — ABNORMAL LOW (ref 13.0–17.0)
Immature Granulocytes: 1 %
Lymphocytes Relative: 23 %
Lymphs Abs: 0.8 10*3/uL (ref 0.7–4.0)
MCH: 33.2 pg (ref 26.0–34.0)
MCHC: 33.8 g/dL (ref 30.0–36.0)
MCV: 98.3 fL (ref 80.0–100.0)
Monocytes Absolute: 0.4 10*3/uL (ref 0.1–1.0)
Monocytes Relative: 12 %
Neutro Abs: 2.1 10*3/uL (ref 1.7–7.7)
Neutrophils Relative %: 62 %
Platelet Count: 227 10*3/uL (ref 150–400)
RBC: 2.95 MIL/uL — ABNORMAL LOW (ref 4.22–5.81)
RDW: 18.4 % — ABNORMAL HIGH (ref 11.5–15.5)
WBC Count: 3.3 10*3/uL — ABNORMAL LOW (ref 4.0–10.5)
nRBC: 0 % (ref 0.0–0.2)

## 2024-03-02 MED ORDER — PALONOSETRON HCL INJECTION 0.25 MG/5ML
0.2500 mg | Freq: Once | INTRAVENOUS | Status: AC
  Administered 2024-03-02: 0.25 mg via INTRAVENOUS
  Filled 2024-03-02: qty 5

## 2024-03-02 MED ORDER — SODIUM CHLORIDE 0.9 % IV SOLN
213.2000 mg | Freq: Once | INTRAVENOUS | Status: AC
Start: 2024-03-02 — End: 2024-03-02
  Administered 2024-03-02: 210 mg via INTRAVENOUS
  Filled 2024-03-02: qty 21

## 2024-03-02 MED ORDER — SODIUM CHLORIDE 0.9% FLUSH
10.0000 mL | INTRAVENOUS | Status: DC | PRN
  Administered 2024-03-02: 10 mL
  Filled 2024-03-02: qty 10

## 2024-03-02 MED ORDER — SODIUM CHLORIDE 0.9 % IV SOLN
INTRAVENOUS | Status: DC
  Filled 2024-03-02: qty 250

## 2024-03-02 MED ORDER — SODIUM CHLORIDE 0.9 % IV SOLN
45.0000 mg/m2 | Freq: Once | INTRAVENOUS | Status: AC
  Administered 2024-03-02: 90 mg via INTRAVENOUS
  Filled 2024-03-02: qty 15

## 2024-03-02 MED ORDER — FAMOTIDINE IN NACL 20-0.9 MG/50ML-% IV SOLN
20.0000 mg | Freq: Once | INTRAVENOUS | Status: AC
  Administered 2024-03-02: 20 mg via INTRAVENOUS
  Filled 2024-03-02: qty 50

## 2024-03-02 MED ORDER — HEPARIN SOD (PORK) LOCK FLUSH 100 UNIT/ML IV SOLN
500.0000 [IU] | Freq: Once | INTRAVENOUS | Status: AC | PRN
Start: 2024-03-02 — End: 2024-03-02
  Administered 2024-03-02: 500 [IU]
  Filled 2024-03-02: qty 5

## 2024-03-02 MED ORDER — CETIRIZINE HCL 10 MG/ML IV SOLN
10.0000 mg | Freq: Once | INTRAVENOUS | Status: AC
  Administered 2024-03-02: 10 mg via INTRAVENOUS
  Filled 2024-03-02: qty 1

## 2024-03-02 MED ORDER — OXYCODONE HCL 5 MG PO TABS: 5.0000 mg | ORAL_TABLET | Freq: Three times a day (TID) | ORAL | 0 refills | Status: DC | PRN

## 2024-03-02 MED ORDER — DEXAMETHASONE SODIUM PHOSPHATE 10 MG/ML IJ SOLN
10.0000 mg | Freq: Once | INTRAMUSCULAR | Status: AC
  Administered 2024-03-02: 10 mg via INTRAVENOUS
  Filled 2024-03-02: qty 1

## 2024-03-02 NOTE — Assessment & Plan Note (Addendum)
 Lab Results  Component Value Date   HGB 9.6 (L) 02/24/2024   TIBC 371 01/20/2024   IRONPCTSAT 21 01/20/2024   FERRITIN 158 01/20/2024    History of IDA, received Venofer  in the past.  Stable hemoglobin.

## 2024-03-02 NOTE — Assessment & Plan Note (Addendum)
 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  s/p dose reduced carboplatin , Taxol  and Keytruda  on 06/01/2023 x 4 cycles, poor PS/hospitalization--> Keytruda  maintenance until Feb 2025 --> PET progression--> 01/05/2024 concurrent chemo RT  Labs are reviewed and discussed with patient. Proceed with carboplatin  Taxol  today - last dose. Finish RT on 5/19.  Repeat CT in 2 weeks.  Plan to resume systemic treatment. - PS 2, recommend Gemcitaibine monotherapy

## 2024-03-02 NOTE — Patient Instructions (Signed)
 [facility] - A DEPT OF [facility]  Discharge Instructions: Thank you for choosing [facility] to provide your oncology and hematology care.  If you have a lab appointment with the Cancer Center, please go directly to the Cancer Center and check in at the registration area.  Wear comfortable clothing and clothing appropriate for easy access to any Portacath or PICC line.   We strive to give you quality time with your provider. You may need to reschedule your appointment if you arrive late (15 or more minutes).  Arriving late affects you and other patients whose appointments are after yours.  Also, if you miss three or more appointments without notifying the office, you may be dismissed from the clinic at the provider's discretion.      For prescription refill requests, have your pharmacy contact our office and allow 72 hours for refills to be completed.    Today you received the following chemotherapy and/or immunotherapy agents- taxol, carboplatin      To help prevent nausea and vomiting after your treatment, we encourage you to take your nausea medication as directed.  BELOW ARE SYMPTOMS THAT SHOULD BE REPORTED IMMEDIATELY: *FEVER GREATER THAN 100.4 F (38 C) OR HIGHER *CHILLS OR SWEATING *NAUSEA AND VOMITING THAT IS NOT CONTROLLED WITH YOUR NAUSEA MEDICATION *UNUSUAL SHORTNESS OF BREATH *UNUSUAL BRUISING OR BLEEDING *URINARY PROBLEMS (pain or burning when urinating, or frequent urination) *BOWEL PROBLEMS (unusual diarrhea, constipation, pain near the anus) TENDERNESS IN MOUTH AND THROAT WITH OR WITHOUT PRESENCE OF ULCERS (sore throat, sores in mouth, or a toothache) UNUSUAL RASH, SWELLING OR PAIN  UNUSUAL VAGINAL DISCHARGE OR ITCHING   Items with * indicate a potential emergency and should be followed up as soon as possible or go to the Emergency Department if any problems should occur.  Please show the CHEMOTHERAPY ALERT CARD or  IMMUNOTHERAPY ALERT CARD at check-in to the Emergency Department and triage nurse.  Should you have questions after your visit or need to cancel or reschedule your appointment, please contact [facility] - A DEPT OF [facility]  [phone] and follow the prompts.  Office hours are 8:00 a.m. to 4:30 p.m. Monday - Friday. Please note that voicemails left after 4:00 p.m. may not be returned until the following business day.  We are closed weekends and major holidays. You have access to a nurse at all times for urgent questions. Please call the main number to the clinic [phone] and follow the prompts.  For any non-urgent questions, you may also contact your provider using MyChart. We now offer e-Visits for anyone 36 and older to request care online for non-urgent symptoms. For details visit [email or web address].   Also download the MyChart app! Go to the app store, search "MyChart", open the app, select [place], and log in with your MyChart username and password.

## 2024-03-02 NOTE — Progress Notes (Signed)
 Hematology/Oncology Progress note [phone] [phone]        REFERRING PROVIDER: Agrawal, Kavita, MD    CHIEF COMPLAINTS/PURPOSE OF CONSULTATION:  Stage IV lung adenocarcinoma.   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  s/p dose reduced carboplatin , Taxol  and Keytruda  on 06/01/2023 x 4 cycles, poor PS/hospitalization--> Keytruda  maintenance until Feb 2025 --> PET progression--> 01/05/2024 concurrent chemo RT  Labs are reviewed and discussed with patient. Proceed with carboplatin  Taxol  today - last dose. Finish RT on 5/19.  Repeat CT in 2 weeks.  Plan to resume systemic treatment. - PS 2, recommend Gemcitaibine monotherapy  Normocytic anemia Lab Results  Component Value Date   HGB 9.6 (L) 02/24/2024   TIBC 371 01/20/2024   IRONPCTSAT 21 01/20/2024   FERRITIN 158 01/20/2024    History of IDA, received Venofer  in the past.  Stable hemoglobin.   Asthma-COPD overlap syndrome (HCC) Chronic respiratory failure on Oxygen .  Continue inhalers.    Encounter for antineoplastic chemotherapy Chemotherapy plan as listed above.   No orders of the defined types were placed in this encounter.   All questions were answered. The patient knows to call the clinic with any problems, questions or concerns.  [name], MD, PhD [facility] Hematology Oncology 03/02/2024    HISTORY OF PRESENTING ILLNESS:  Austin Patterson 68 y.o. male presents to establish care for Stage IV lung adenocarcinoma.  Patient previously followed up with Dr. Aris Bel who left practice. He establish care with on 03/02/2024  I have reviewed his chart and materials related to his cancer extensively and collaborated history with the patient.  Summary of oncologic history is as follows: Oncology History  Stage IV adenocarcinoma of lung (HCC)  05/18/2023 Initial Diagnosis   Sarcomatoid carcinoma of lung (HCC)   05/20/2023 Cancer Staging   Staging form: Lung, AJCC 8th Edition - Clinical: Stage IV (cT4, cN3, cM1) - Signed by Agrawal, Kavita, MD on 05/20/2023 Stage prefix: Initial diagnosis   06/01/2023 - 11/08/2023 Chemotherapy   Patient is on Treatment Plan : LUNG NSCLC Carboplatin  (6) + Paclitaxel  (200) + Pembrolizumab  (200) D1 q21d x 4 cycles / Pembrolizumab  (200) Maintenance D1 q21d     01/06/2024 -  Chemotherapy   Patient is on Treatment Plan : LUNG Carboplatin  + Paclitaxel  + XRT q7d      He reports feeling ok today.  + increased fatigue.  Chronic respiratory failure, SOB at baseline.  No nausea vomiting diarrhea.   MEDICAL HISTORY:  Past Medical History:  Diagnosis Date   Abdominal pain 12/07/2022   Acute urinary retention 12/07/2022   Chest pain 01/24/2022   COPD (chronic obstructive pulmonary disease) (HCC)    GERD (gastroesophageal reflux disease)    Hemothorax on right 05/11/2023   Hyperlipidemia    Hypertension    Pleuritic pain 05/10/2023   Pneumothorax after biopsy 05/11/2023   Post procedure discomfort 05/13/2023   Pulmonary fibrosis (HCC) 11/2015    SURGICAL HISTORY: Past Surgical History:  Procedure Laterality Date   COLONOSCOPY     CORONARY STENT PLACEMENT     ESOPHAGOGASTRODUODENOSCOPY (EGD) WITH PROPOFOL  N/A 09/23/2016   Procedure: ESOPHAGOGASTRODUODENOSCOPY (EGD) WITH PROPOFOL ;  Surgeon: Luke Salaam, MD;  Location: [facility] END[facility];  Service: Endoscopy;  Laterality: N/A;   IR IMAGING GUIDED PORT INSERTION  05/26/2023  IR IMAGING GUIDED PORT INSERTION  07/30/2023   IR RADIOLOGIST EVAL & MGMT  07/20/2023   KYPHOPLASTY N/A 03/14/2020   Procedure: T7 & T11 KYPHOPLASTY;  Surgeon: Molli Angelucci, MD;  Location: [facility] ORS;  Service: Orthopedics;  Laterality: N/A;   PORT-A-CATH REMOVAL N/A 06/14/2023    Procedure: REMOVAL PORT-A-CATH;  Surgeon: Conrado Delay, DO;  Location: [facility] ORS;  Service: General;  Laterality: N/A;   SHOULDER ACROMIOPLASTY      SOCIAL HISTORY: Social History   Socioeconomic History   Marital status: Divorced    Spouse name: Not on file   Number of children: Not on file   Years of education: Not on file   Highest education level: Not on file  Occupational History   Not on file  Tobacco Use   Smoking status: Former    Current packs/day: 0.00    Average packs/day: 2.0 packs/day for 50.0 years (100.0 ttl pk-yrs)    Types: Cigarettes    Start date: 10/13/1965    Quit date: 10/14/2015    Years since quitting: 8.3   Smokeless tobacco: Former  Building services engineer status: Never Used  Substance and Sexual Activity   Alcohol use: Yes    Alcohol/week: 56.0 standard drinks of alcohol    Types: 56 Cans of beer per week    Comment: "I sit around and drink beer, that's all I got to do"   Drug use: No   Sexual activity: Not Currently  Other Topics Concern   Not on file  Social History Narrative   Not on file   Social Drivers of Health   Financial Resource Strain: Not on file  Food Insecurity: No Food Insecurity (12/29/2023)   Hunger Vital Sign    Worried About Running Out of Food in the Last Year: Never true    Ran Out of Food in the Last Year: Never true  Transportation Needs: Unmet Transportation Needs (01/27/2024)   PRAPARE - Administrator, Civil Service (Medical): Yes    Lack of Transportation (Non-Medical): Yes  Physical Activity: Not on file  Stress: Not on file  Social Connections: Socially Isolated (12/29/2023)   Social Connection and Isolation Panel [NHANES]    Frequency of Communication with Friends and Family: Three times a week    Frequency of Social Gatherings with Friends and Family: Once a week    Attends Religious Services: Never    Database administrator or Organizations: No    Attends Banker Meetings: Never     Marital Status: Divorced  Catering manager Violence: Not At Risk (12/29/2023)   Humiliation, Afraid, Rape, and Kick questionnaire    Fear of Current or Ex-Partner: No    Emotionally Abused: No    Physically Abused: No    Sexually Abused: No    FAMILY HISTORY: Family History  Problem Relation Age of Onset   Heart disease Mother     ALLERGIES:  is allergic to amoxicillin  and tizanidine .  MEDICATIONS:  Current Outpatient Medications  Medication Sig Dispense Refill   acetaminophen  (TYLENOL ) 500 MG tablet Take 1,000 mg by mouth every 6 (six) hours as needed for moderate pain or headache.     albuterol  (VENTOLIN  HFA) 108 (90 Base) MCG/ACT inhaler Inhale 2 puffs into the lungs every 6 (six) hours as needed for wheezing or shortness of breath. 8 g 11   amLODipine  (NORVASC ) 5 MG tablet Take 1 tablet (5 mg total) by mouth at bedtime. Skip the dose if  systolic BP less than 130 mmHg 30 tablet 5   aspirin  EC 81 MG tablet Take 1 tablet (81 mg total) by mouth daily. 90 tablet 3   atorvastatin  (LIPITOR) 40 MG tablet Take 1 tablet (40 mg total) by mouth at bedtime. 90 tablet 3   CALCIUM  600/VITAMIN D  600-10 MG-MCG TABS Take 1 tablet by mouth 2 (two) times daily.     clopidogrel  (PLAVIX ) 75 MG tablet Take 1 tablet (75 mg total) by mouth daily. 90 tablet 3   Dupilumab  (DUPIXENT ) 300 MG/2ML SOPN Inject 300 mg into the skin every 14 (fourteen) days. 12 mL 1   EPINEPHrine  0.3 mg/0.3 mL IJ SOAJ injection Inject 0.3 mg into the muscle as needed for anaphylaxis.     gabapentin  (NEURONTIN ) 300 MG capsule TAKE 1 CAPSULE BY MOUTH THREE TIMES A DAY 90 capsule 1   guaiFENesin -dextromethorphan  (ROBITUSSIN DM) 100-10 MG/5ML syrup Take 10 mLs by mouth every 6 (six) hours as needed for cough. 118 mL 0   ipratropium-albuterol  (DUONEB) 0.5-2.5 (3) MG/3ML SOLN INHALE 1 VIAL THROUGH NEBULIZER EVERY 6 HOURS (Patient taking differently: Take 3 mLs by nebulization every 6 (six) hours as needed.) 270 mL 2    lidocaine -prilocaine  (EMLA ) cream Apply to affected area once 30 g 3   magnesium  oxide (MAG-OX) 400 (241.3 Mg) MG tablet Take 1 tablet (400 mg total) by mouth daily. 30 tablet 0   metoprolol  succinate (TOPROL -XL) 50 MG 24 hr tablet Take 50 mg by mouth daily.     montelukast  (SINGULAIR ) 10 MG tablet Take 10 mg by mouth daily.     Multiple Vitamin (MULTIVITAMIN WITH MINERALS) TABS tablet Take 1 tablet by mouth daily. 30 tablet 0   omega-3 acid ethyl esters (LOVAZA) 1 g capsule Take 1 g by mouth daily.     pantoprazole  (PROTONIX ) 40 MG tablet Take 1 tablet (40 mg total) by mouth 2 (two) times daily. 90 tablet 3   potassium chloride  SA (KLOR-CON  M) 20 MEQ tablet Take 20 mEq by mouth daily.     predniSONE  (DELTASONE ) 10 MG tablet TAKE 1 TABLET (10 MG TOTAL) BY MOUTH DAILY AS NEEDED. TAKE AS DIRECTED. 30 tablet 1   Spacer/Aero-Holding Chambers (AEROCHAMBER MV) inhaler Use as instructed 1 each 0   sucralfate  (CARAFATE ) 1 g tablet Take 1 tablet (1 g total) by mouth 3 (three) times daily before meals. Dissolve in 4tbs warm water , swish and swallow 90 tablet 4   ferrous sulfate  325 (65 FE) MG tablet Take 1 tablet (325 mg total) by mouth 2 (two) times daily with a meal. (Patient not taking: Reported on 01/27/2024) 60 tablet 2   oxyCODONE  (OXY IR/ROXICODONE ) 5 MG immediate release tablet Take 1 tablet (5 mg total) by mouth every 8 (eight) hours as needed for severe pain (pain score 7-10). 90 tablet 0   No current facility-administered medications for this visit.   Facility-Administered Medications Ordered in Other Visits  Medication Dose Route Frequency Provider Last Rate Last Admin   0.9 %  sodium chloride  infusion   Intravenous Continuous Agrawal, Kavita, MD 10 mL/hr at 03/02/24 1037 New Bag at 03/02/24 1037   sodium chloride  flush (NS) 0.9 % injection 10 mL  10 mL Intracatheter PRN Agrawal, Kavita, MD   10 mL at 03/02/24 1337    Review of Systems  Constitutional:  Positive for fatigue. Negative for  appetite change, chills and fever.  HENT:   Negative for hearing loss and voice change.   Eyes:  Negative for eye problems  and icterus.  Respiratory:  Positive for shortness of breath. Negative for chest tightness and cough.   Cardiovascular:  Negative for chest pain and leg swelling.  Gastrointestinal:  Negative for abdominal distention and abdominal pain.  Endocrine: Negative for hot flashes.  Genitourinary:  Negative for difficulty urinating, dysuria and frequency.   Musculoskeletal:  Negative for arthralgias.  Skin:  Negative for itching and rash.  Neurological:  Negative for light-headedness and numbness.  Hematological:  Negative for adenopathy. Does not bruise/bleed easily.  Psychiatric/Behavioral:  Negative for confusion.      PHYSICAL EXAMINATION: ECOG PERFORMANCE STATUS: 2 - Symptomatic, <50% confined to bed  Vitals:   03/02/24 0947  BP: 120/81  Pulse: 92  Resp: 18  Temp: (!) 96.1 F (35.6 C)  SpO2: 99%   Filed Weights   03/02/24 0947  Weight: 182 lb (82.6 kg)    Physical Exam Constitutional:      General: He is not in acute distress.    Appearance: He is not diaphoretic.  HENT:     Head: Normocephalic and atraumatic.     Mouth/Throat:     Pharynx: No oropharyngeal exudate.  Eyes:     General: No scleral icterus.    Pupils: Pupils are equal, round, and reactive to light.  Cardiovascular:     Rate and Rhythm: Normal rate and regular rhythm.  Pulmonary:     Effort: Pulmonary effort is normal. No respiratory distress.     Comments: Decreased breath sounds bilaterally. On nasal cannula oxygen  Abdominal:     General: Bowel sounds are normal. There is no distension.     Palpations: Abdomen is soft.  Musculoskeletal:        General: Normal range of motion.     Cervical back: Normal range of motion and neck supple.  Skin:    General: Skin is warm and dry.     Findings: No erythema.  Neurological:     Mental Status: He is alert and oriented to person,  place, and time. Mental status is at baseline.     Motor: No abnormal muscle tone.  Psychiatric:        Mood and Affect: Affect normal.      LABORATORY DATA:  I have reviewed the data as listed    Latest Ref Rng & Units 03/02/2024    9:38 AM 02/24/2024    8:44 AM 02/17/2024    9:13 AM  CBC  WBC 4.0 - 10.5 K/uL 3.3  4.2  3.5   Hemoglobin 13.0 - 17.0 g/dL 9.8  9.6  54.6   Hematocrit 39.0 - 52.0 % 29.0  28.6  32.2   Platelets 150 - 400 K/uL 227  186  252       Latest Ref Rng & Units 03/02/2024    9:38 AM 02/24/2024    8:44 AM 02/17/2024    9:13 AM  CMP  Glucose 70 - 99 mg/dL 270  350  093   BUN 8 - 23 mg/dL 14  11  13    Creatinine 0.61 - 1.24 mg/dL 8.18  2.99  3.71   Sodium 135 - 145 mmol/L 133  134  134   Potassium 3.5 - 5.1 mmol/L 4.0  3.7  4.1   Chloride 98 - 111 mmol/L 99  97  99   CO2 22 - 32 mmol/L 24  28  26    Calcium  8.9 - 10.3 mg/dL 8.8  8.5  8.7   Total Protein 6.5 - 8.1 g/dL 6.7  6.4  6.8   Total Bilirubin 0.0 - 1.2 mg/dL 0.7  0.7  0.6   Alkaline Phos 38 - 126 U/L 54  51  49   AST 15 - 41 U/L 28  27  24    ALT 0 - 44 U/L 26  25  17       RADIOGRAPHIC STUDIES: I have personally reviewed the radiological images as listed and agreed with the findings in the report. No results found.

## 2024-03-02 NOTE — Assessment & Plan Note (Signed)
 Chemotherapy plan as listed above

## 2024-03-02 NOTE — Assessment & Plan Note (Signed)
 Chronic respiratory failure on Oxygen .  Continue inhalers.

## 2024-03-03 ENCOUNTER — Other Ambulatory Visit: Payer: Self-pay

## 2024-03-03 ENCOUNTER — Ambulatory Visit
Admission: RE | Admit: 2024-03-03 | Discharge: 2024-03-03 | Disposition: A | Discharge status: Home / Self Care | Source: Ambulatory Visit | Attending: Radiation Oncology | Admitting: Radiation Oncology

## 2024-03-03 DIAGNOSIS — Z510 Encounter for antineoplastic radiation therapy: Secondary | ICD-10-CM | POA: Diagnosis not present

## 2024-03-03 LAB — RAD ONC ARIA SESSION SUMMARY
Course Elapsed Days: 17
Plan Fractions Treated to Date: 14
Plan Prescribed Dose Per Fraction: 2 Gy
Plan Total Fractions Prescribed: 15
Plan Total Prescribed Dose: 30 Gy
Reference Point Dosage Given to Date: 28 Gy
Reference Point Session Dosage Given: 2 Gy
Session Number: 14

## 2024-03-06 ENCOUNTER — Encounter: Payer: Self-pay | Admitting: Internal Medicine

## 2024-03-06 ENCOUNTER — Ambulatory Visit
Admission: RE | Admit: 2024-03-06 | Discharge: 2024-03-06 | Disposition: A | Discharge status: Home / Self Care | Source: Ambulatory Visit | Attending: Radiation Oncology | Admitting: Radiation Oncology

## 2024-03-06 ENCOUNTER — Encounter: Payer: Self-pay | Admitting: Oncology

## 2024-03-06 ENCOUNTER — Other Ambulatory Visit: Payer: Self-pay

## 2024-03-06 DIAGNOSIS — Z510 Encounter for antineoplastic radiation therapy: Secondary | ICD-10-CM | POA: Diagnosis not present

## 2024-03-06 LAB — RAD ONC ARIA SESSION SUMMARY
Course Elapsed Days: 20
Plan Fractions Treated to Date: 15
Plan Prescribed Dose Per Fraction: 2 Gy
Plan Total Fractions Prescribed: 15
Plan Total Prescribed Dose: 30 Gy
Reference Point Dosage Given to Date: 30 Gy
Reference Point Session Dosage Given: 2 Gy
Session Number: 15

## 2024-03-06 NOTE — Progress Notes (Signed)
 START OFF PATHWAY REGIMEN - Non-Small Cell Lung   [id number]:Gemcitabine 1,000 mg/m2 IV D1,8 q21 Days:   A cycle is every 21 days:     Gemcitabine   **Always confirm dose/schedule in your pharmacy ordering system**  Patient Characteristics: Stage IV Metastatic, Nonsquamous, Molecular Analysis Completed, Molecular Alteration Present and Targeted Therapy Exhausted OR KRAS G12C+ or HER2+ or NRG1+ Present and No Prior Chemo/Immunotherapy OR No Alteration Present, Second Line -  Chemotherapy/Immunotherapy, PS = 2, Prior PD-1/PD-L1 Inhibitor + Platinum-Based Chemotherapy or No Prior PD-1/PD-L1 Inhibitor and Not a Candidate for Immunotherapy Therapeutic Status: Stage IV Metastatic Histology: Nonsquamous Cell Broad Molecular Profiling Status: Animal nutritionist Analysis Results: No Alteration Present ECOG Performance Status: 2 Chemotherapy/Immunotherapy Line of Therapy: Second Line Chemotherapy/Immunotherapy Immunotherapy Candidate Status: Not a Candidate for Immunotherapy Prior Immunotherapy Status: Prior PD-1/PD-L1 Inhibitor + Platinum-Based Chemotherapy Intent of Therapy: Non-Curative / Palliative Intent, Discussed with Patient

## 2024-03-07 NOTE — Radiation Completion Notes (Signed)
 Patient Name: Austin Patterson, Austin Patterson MRN: [id number] Date of Birth: 1956-06-09 Referring Physician: Kit Peri, M.D. Date of Service: 2024-03-07 Radiation Oncologist: Glenis Langdon, M.D. [facility] [place]                             RADIATION ONCOLOGY END OF TREATMENT NOTE     Diagnosis: C34.91 Malignant neoplasm of unspecified part of right bronchus or lung Staging on 2023-05-20: Stage IV adenocarcinoma of lung (HCC) T=cT4, N=cN3, M=cM1 Intent: Curative     HPI: Patient is a 68 year old male.  Now seen out 2 weeks and reevaluation having received 30 Gray in 15 fractions to his chest and mediastinum for stage IV poorly differentiated adenocarcinoma of the lung with spindle cell component which was progressing and causing hemoptysis.  He is seen today in follow-up.  He has had complete resolution of his hemoptysis.  He states his breathing is stable although he is on continuous nasal oxygen .  He is having no dysphagia at this time.      ==========DELIVERED PLANS==========  First Treatment Date: 2024-02-15 Last Treatment Date: 2024-03-06   Plan Name: Lung_R_Bst Site: Lung, Right Technique: 3D Mode: Photon Dose Per Fraction: 2 Gy Prescribed Dose (Delivered / Prescribed): 30 Gy / 30 Gy Prescribed Fxs (Delivered / Prescribed): 15 / 15     ==========ON TREATMENT VISIT DATES========== 2024-02-15, 2024-02-22, 2024-02-29     ==========UPCOMING VISITS========== 04/20/2024 [facility] OFFICE VISIT - Chuckie Craven Marc Senior, MD  03/31/2024 [facility] VAN PATIENT [facility] VAN  03/31/2024 [facility] INFUSION 1HR30MIN (90) [facility]- MO INFUSION CHAIR 22  03/31/2024 [facility] EST PT 15 [name], MD  03/31/2024 [facility] ON[facility] PORT FLUSH W/LAB [facility]-PORT FLUSH  03/29/2024 [facility] ONCOLOGY FOLLOW UP 30 Glenis Langdon, MD  03/23/2024 [facility] VAN PATIENT [facility] VAN  03/23/2024 [facility] ON[facility] INFUSION 1HR30MIN  (90) [facility] INFUSION CHAIR 19  03/23/2024 [facility] EST PT 15 [name], MD  03/23/2024 [facility] PORT FLUSH W/LAB [facility]-PORT FLUSH  03/17/2024 [facility] IMAGING CT CHEST ABD/PELVIS W CONTRAST [facility]  03/17/2024 [facility] VAN PATIENT [facility] VAN        ==========APPENDIX - ON TREATMENT VISIT NOTES==========   See weekly On Treatment Notes in Epic for details in the Media tab (listed as Progress notes on the On Treatment Visit Dates listed above).

## 2024-03-08 ENCOUNTER — Telehealth: Payer: Self-pay

## 2024-03-08 NOTE — Telephone Encounter (Signed)
 Detailed message left on VM with appt details.

## 2024-03-08 NOTE — Progress Notes (Signed)
 Pharmacist Chemotherapy Monitoring - Initial Assessment    Anticipated start date: 03/23/24   The following has been reviewed per standard work regarding the patient's treatment regimen: The patient's diagnosis, treatment plan and drug doses, and organ/hematologic function Lab orders and baseline tests specific to treatment regimen  The treatment plan start date, drug sequencing, and pre-medications Prior authorization status  Patient's documented medication list, including drug-drug interaction screen and prescriptions for anti-emetics and supportive care specific to the treatment regimen The drug concentrations, fluid compatibility, administration routes, and timing of the medications to be used The patient's access for treatment and lifetime cumulative dose history, if applicable  The patient's medication allergies and previous infusion related reactions, if applicable   Changes made to treatment plan:  N/A  Follow up needed:  N/A   Glendora Landsman, PharmD, BCPS Clinical Pharmacist   03/08/2024  3:01 PM

## 2024-03-08 NOTE — Telephone Encounter (Signed)
-----   Message from Roots E sent at 03/07/2024  8:25 AM EDT ----- Appts scheduled and AVS mailed ----- Message ----- From: [name], MD Sent: 03/06/2024  11:02 PM EDT To: Craven Do, RN; Ronell Coe, CMA; #  I finalized his future treatment chemo IS. Plan 6/6 lab MD Gemcitabine. Thanks.   Allyne Areola

## 2024-03-10 ENCOUNTER — Encounter

## 2024-03-10 ENCOUNTER — Ambulatory Visit

## 2024-03-16 ENCOUNTER — Encounter: Payer: Self-pay | Admitting: Oncology

## 2024-03-17 ENCOUNTER — Ambulatory Visit
Admission: RE | Admit: 2024-03-17 | Discharge: 2024-03-17 | Disposition: A | Discharge status: Home / Self Care | Source: Ambulatory Visit | Attending: Internal Medicine | Admitting: Internal Medicine

## 2024-03-17 ENCOUNTER — Inpatient Hospital Stay

## 2024-03-17 ENCOUNTER — Encounter

## 2024-03-17 DIAGNOSIS — C3491 Malignant neoplasm of unspecified part of right bronchus or lung: Secondary | ICD-10-CM | POA: Insufficient documentation

## 2024-03-17 MED ORDER — IOHEXOL 300 MG/ML  SOLN
85.0000 mL | Freq: Once | INTRAMUSCULAR | Status: AC | PRN
  Administered 2024-03-17: 85 mL via INTRAVENOUS

## 2024-03-23 ENCOUNTER — Inpatient Hospital Stay: Attending: Oncology

## 2024-03-23 ENCOUNTER — Encounter

## 2024-03-23 ENCOUNTER — Inpatient Hospital Stay

## 2024-03-23 ENCOUNTER — Inpatient Hospital Stay (HOSPITAL_BASED_OUTPATIENT_CLINIC_OR_DEPARTMENT_OTHER): Admitting: Oncology

## 2024-03-23 VITALS — BP 156/91 | HR 89 | Temp 96.1°F | Resp 18 | Ht 67.0 in | Wt 183.9 lb

## 2024-03-23 DIAGNOSIS — J4489 Other specified chronic obstructive pulmonary disease: Secondary | ICD-10-CM

## 2024-03-23 DIAGNOSIS — D696 Thrombocytopenia, unspecified: Secondary | ICD-10-CM | POA: Insufficient documentation

## 2024-03-23 DIAGNOSIS — Z5111 Encounter for antineoplastic chemotherapy: Secondary | ICD-10-CM | POA: Insufficient documentation

## 2024-03-23 DIAGNOSIS — D7589 Other specified diseases of blood and blood-forming organs: Secondary | ICD-10-CM | POA: Diagnosis not present

## 2024-03-23 DIAGNOSIS — C3491 Malignant neoplasm of unspecified part of right bronchus or lung: Secondary | ICD-10-CM | POA: Insufficient documentation

## 2024-03-23 DIAGNOSIS — Z87891 Personal history of nicotine dependence: Secondary | ICD-10-CM | POA: Diagnosis not present

## 2024-03-23 DIAGNOSIS — Z7982 Long term (current) use of aspirin: Secondary | ICD-10-CM | POA: Diagnosis not present

## 2024-03-23 DIAGNOSIS — F1090 Alcohol use, unspecified, uncomplicated: Secondary | ICD-10-CM

## 2024-03-23 DIAGNOSIS — Z7902 Long term (current) use of antithrombotics/antiplatelets: Secondary | ICD-10-CM | POA: Insufficient documentation

## 2024-03-23 DIAGNOSIS — Z79899 Other long term (current) drug therapy: Secondary | ICD-10-CM | POA: Diagnosis not present

## 2024-03-23 LAB — CMP (CANCER CENTER ONLY)
ALT: 16 U/L (ref 0–44)
AST: 20 U/L (ref 15–41)
Albumin: 4.1 g/dL (ref 3.5–5.0)
Alkaline Phosphatase: 55 U/L (ref 38–126)
Anion gap: 9 (ref 5–15)
BUN: 16 mg/dL (ref 8–23)
CO2: 25 mmol/L (ref 22–32)
Calcium: 8.3 mg/dL — ABNORMAL LOW (ref 8.9–10.3)
Chloride: 100 mmol/L (ref 98–111)
Creatinine: 0.73 mg/dL (ref 0.61–1.24)
GFR, Estimated: >60 mL/min (ref >60)
Glucose, Bld: 101 mg/dL — ABNORMAL HIGH (ref 70–99)
Potassium: 4.2 mmol/L (ref 3.5–5.1)
Sodium: 134 mmol/L — ABNORMAL LOW (ref 135–145)
Total Bilirubin: 0.9 mg/dL (ref 0.0–1.2)
Total Protein: 6.6 g/dL (ref 6.5–8.1)

## 2024-03-23 LAB — CBC WITH DIFFERENTIAL (CANCER CENTER ONLY)
Abs Immature Granulocytes: 0.03 10*3/uL (ref 0.00–0.07)
Basophils Absolute: 0 10*3/uL (ref 0.0–0.1)
Basophils Relative: 0 %
Eosinophils Absolute: 0.1 10*3/uL (ref 0.0–0.5)
Eosinophils Relative: 2 %
HCT: 32.3 % — ABNORMAL LOW (ref 39.0–52.0)
Hemoglobin: 10.8 g/dL — ABNORMAL LOW (ref 13.0–17.0)
Immature Granulocytes: 1 %
Lymphocytes Relative: 9 %
Lymphs Abs: 0.5 10*3/uL — ABNORMAL LOW (ref 0.7–4.0)
MCH: 34.2 pg — ABNORMAL HIGH (ref 26.0–34.0)
MCHC: 33.4 g/dL (ref 30.0–36.0)
MCV: 102.2 fL — ABNORMAL HIGH (ref 80.0–100.0)
Monocytes Absolute: 0.6 10*3/uL (ref 0.1–1.0)
Monocytes Relative: 11 %
Neutro Abs: 4 10*3/uL (ref 1.7–7.7)
Neutrophils Relative %: 77 %
Platelet Count: 114 10*3/uL — ABNORMAL LOW (ref 150–400)
RBC: 3.16 MIL/uL — ABNORMAL LOW (ref 4.22–5.81)
RDW: 17.7 % — ABNORMAL HIGH (ref 11.5–15.5)
WBC Count: 5.2 10*3/uL (ref 4.0–10.5)
nRBC: 0 % (ref 0.0–0.2)

## 2024-03-23 MED ORDER — PROCHLORPERAZINE MALEATE 10 MG PO TABS
10.0000 mg | ORAL_TABLET | Freq: Once | ORAL | Status: AC
  Administered 2024-03-23: 10 mg via ORAL
  Filled 2024-03-23: qty 1

## 2024-03-23 MED ORDER — HEPARIN SOD (PORK) LOCK FLUSH 100 UNIT/ML IV SOLN
500.0000 [IU] | Freq: Once | INTRAVENOUS | Status: AC | PRN
  Administered 2024-03-23: 500 [IU]
  Filled 2024-03-23: qty 5

## 2024-03-23 MED ORDER — SODIUM CHLORIDE 0.9 % IV SOLN
1000.0000 mg/m2 | Freq: Once | INTRAVENOUS | Status: AC
  Administered 2024-03-23: 1976 mg via INTRAVENOUS
  Filled 2024-03-23: qty 51.97

## 2024-03-23 MED ORDER — SODIUM CHLORIDE 0.9 % IV SOLN
INTRAVENOUS | Status: DC
  Filled 2024-03-23: qty 250

## 2024-03-23 NOTE — Assessment & Plan Note (Signed)
 He drinks alcohol.  Recommend empiric B12 1000mcg supplementation.

## 2024-03-23 NOTE — Progress Notes (Signed)
 Patient reports increased SOBr since last visit.

## 2024-03-23 NOTE — Assessment & Plan Note (Signed)
 Recommend patient decrease alcohol consumption.

## 2024-03-23 NOTE — Assessment & Plan Note (Signed)
 Chemotherapy plan as listed above

## 2024-03-23 NOTE — Patient Instructions (Signed)

## 2024-03-23 NOTE — Assessment & Plan Note (Signed)
 Check B12 and folate

## 2024-03-23 NOTE — Assessment & Plan Note (Addendum)
 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  s/p dose reduced carboplatin , Taxol  and Keytruda  on 06/01/2023 x 4 cycles, poor PS/hospitalization--> Keytruda  maintenance until Feb 2025 --> PET progression--> 01/05/2024 concurrent chemo RT --> May 2025 CT showed slight decrease of lung mass.  Labs are reviewed and discussed with patient. Proceed with Gemcitabine D1 D8 Q21 days as systemic maintenance.  Rationale and side effects were reviewed with patient.

## 2024-03-23 NOTE — Assessment & Plan Note (Signed)
 Chronic respiratory failure on Oxygen .  Continue inhalers.

## 2024-03-23 NOTE — Assessment & Plan Note (Signed)
 Recommend calcium  1200mg  and Vitamin D  supplementation.

## 2024-03-23 NOTE — Progress Notes (Signed)
 Hematology/Oncology Progress note [phone] [phone]         CHIEF COMPLAINTS/PURPOSE OF CONSULTATION:  Stage IV lung adenocarcinoma.   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  s/p dose reduced carboplatin , Taxol  and Keytruda  on 06/01/2023 x 4 cycles, poor PS/hospitalization--> Keytruda  maintenance until Feb 2025 --> PET progression--> 01/05/2024 concurrent chemo RT --> May 2025 CT showed slight decrease of lung mass.  Labs are reviewed and discussed with patient. Proceed with Gemcitabine D1 D8 Q21 days as systemic maintenance.  Rationale and side effects were reviewed with patient.    Asthma-COPD overlap syndrome (HCC) Chronic respiratory failure on Oxygen .  Continue inhalers.    Encounter for antineoplastic chemotherapy Chemotherapy plan as listed above.   Hypocalcemia Recommend calcium  1200mg  and Vitamin D  supplementation.   Macrocytosis He drinks alcohol.  Recommend empiric B12 1000mcg supplementation.   Thrombocytopenia (HCC) Check B12 and folate  Alcohol use Recommend patient decrease alcohol consumption.   Orders Placed This Encounter  Procedures   CBC with Differential (Cancer Center Only)    Standing Status:   Future    Expected Date:   04/17/2024    Expiration Date:   04/17/2025   CMP (Cancer Center only)    Standing Status:   Future    Expected Date:   04/17/2024    Expiration Date:   04/17/2025   CBC with Differential (Cancer Center Only)    Standing Status:   Future    Expected Date:   04/24/2024    Expiration Date:   04/24/2025   CMP (Cancer Center only)    Standing Status:   Future    Expected Date:   04/24/2024    Expiration Date:   04/24/2025   Vitamin B12    Standing Status:    Future    Expected Date:   04/03/2024    Expiration Date:   04/03/2025    All questions were answered. The patient knows to call the clinic with any problems, questions or concerns.  [name], MD, PhD [facility] Hematology Oncology 03/23/2024    HISTORY OF PRESENTING ILLNESS:  Austin Patterson 68 y.o. male presents to establish care for Stage IV lung adenocarcinoma.  Patient previously followed up with Dr. Aris Bel who left practice. He establish care with on 03/02/2024  I have reviewed his chart and materials related to his cancer extensively and collaborated history with the patient. Summary of oncologic history is as follows: Oncology History  Stage IV adenocarcinoma of lung (HCC)  04/19/2023 Imaging   PET scan showed 1. 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 2. Right lower lobe hypermetabolic pulmonary nodule, favoring synchronous primary over pulmonary metastasis. 3. Incidental findings, including: Aortic atherosclerosis (ICD10-I70.0), coronary artery atherosclerosis and emphysema (ICD10-J43.9). Prostatomegaly. Sinus disease.   05/18/2023 Initial Diagnosis   Sarcomatoid carcinoma of lung (HCC)   05/20/2023 Cancer Staging   Staging form: Lung, AJCC 8th Edition - Clinical: Stage IV (cT4, cN3, cM1) - Signed by Agrawal, Kavita, MD on 05/20/2023 Stage prefix: Initial diagnosis   06/01/2023 - 11/08/2023 Chemotherapy   Patient is on Treatment Plan : LUNG NSCLC Carboplatin  (6) + Paclitaxel  (200) + Pembrolizumab  (200) D1 q21d x 4 cycles / Pembrolizumab  (  200) Maintenance D1 q21d     08/30/2023 Imaging   CT chest with contrast improving right middle lobe mass, corresponding to the patient's known primary bronchogenic carcinoma. Small mediastinal and right perihilar nodal metastases are also improving.   Associated bilateral lower lobe pulmonary metastases,  overall improved, although one right lower lobe nodular opacity is mildly progressive. Attention on follow-up is suggested.   Stable bilateral adrenal adenomas.   Aortic Atherosclerosis (ICD10-I70.0) and Emphysema (ICD10-J43.9).   11/26/2023 Imaging   CT chest abdomen pelvis with contrast 1. Interval progression of disease. The dominant mass within the right middle lobe has increased in size in the interval. There is also been significant increase in size of left lower lobe metastasis. Stable to mild increase in size of metastasis to the posterior right lower lobe. 2. Stable appearance of mediastinal and right hilar lymph nodes. 3. No signs of metastatic disease within the abdomen or pelvis. 4. Aortic Atherosclerosis (ICD10-I70.0) and Emphysema (ICD10-J43.9).   12/14/2023 Imaging   PET scan showed 1. Disease progression as evidenced by hypermetabolic nodules and masses in the lungs bilaterally as well as hypermetabolic metastatic mediastinal, hilar and periportal lymph nodes. 2. Bilateral adrenal adenomas. 3. Gastric wall thickening. 4. Aortic atherosclerosis (ICD10-I70.0). Coronary artery calcification. 5.  Emphysema (ICD10-J43.9)   01/06/2024 - 03/02/2024 Chemotherapy   Patient is on Treatment Plan : LUNG Carboplatin  + Paclitaxel  + XRT q7d     03/17/2024 Imaging   CT chest abdomen pelvis w contrast showed Overall slightly improved from previous examination. The large lung masses involving the middle lobe and left lower lobe appears slightly smaller today. Lymph nodes in the mediastinum, right hilum and upper abdomen are also slightly smaller. No new mass lesion, lymph node enlargement identified. Extensive vascular calcifications with some areas of stenosis particularly the common femoral arteries bilaterally. Please correlate with symptoms. Fatty liver infiltration.   Advanced emphysematous lung changes.   One new small right-sided lung 4 mm nodule identified. Recommend  attention on follow-up.     03/23/2024 -  Chemotherapy   Patient is on Treatment Plan : LUNG Gemcitabine (1000) D1,8 q21d      He reports feeling ok today.  + increased SOB to his baseline level. Chronic respiratory failure No nausea vomiting diarrhea.  He drinks 4-5 cans of beer on some days.   MEDICAL HISTORY:  Past Medical History:  Diagnosis Date   Abdominal pain 12/07/2022   Acute urinary retention 12/07/2022   Chest pain 01/24/2022   COPD (chronic obstructive pulmonary disease) (HCC)    GERD (gastroesophageal reflux disease)    Hemothorax on right 05/11/2023   Hyperlipidemia    Hypertension    Pleuritic pain 05/10/2023   Pneumothorax after biopsy 05/11/2023   Post procedure discomfort 05/13/2023   Pulmonary fibrosis (HCC) 11/2015    SURGICAL HISTORY: Past Surgical History:  Procedure Laterality Date   COLONOSCOPY     CORONARY STENT PLACEMENT     ESOPHAGOGASTRODUODENOSCOPY (EGD) WITH PROPOFOL  N/A 09/23/2016   Procedure: ESOPHAGOGASTRODUODENOSCOPY (EGD) WITH PROPOFOL ;  Surgeon: Luke Salaam, MD;  Location: [facility];  Service: Endoscopy;  Laterality: N/A;   IR IMAGING GUIDED PORT INSERTION  05/26/2023   IR IMAGING GUIDED PORT INSERTION  07/30/2023   IR RADIOLOGIST EVAL & MGMT  07/20/2023   KYPHOPLASTY N/A 03/14/2020   Procedure: T7 & T11 KYPHOPLASTY;  Surgeon: Molli Angelucci, MD;  Location: [facility] ORS;  Service: Orthopedics;  Laterality: N/A;   PORT-A-CATH REMOVAL N/A 06/14/2023   Procedure: REMOVAL PORT-A-CATH;  Surgeon: Rosea Conch,  Isami, DO;  Location: [facility] ORS;  Service: General;  Laterality: N/A;   SHOULDER ACROMIOPLASTY      SOCIAL HISTORY: Social History   Socioeconomic History   Marital status: Divorced    Spouse name: Not on file   Number of children: Not on file   Years of education: Not on file   Highest education level: Not on file  Occupational History   Not on file  Tobacco Use   Smoking status: Former    Current packs/day: 0.00    Average packs/day:  2.0 packs/day for 50.0 years (100.0 ttl pk-yrs)    Types: Cigarettes    Start date: 10/13/1965    Quit date: 10/14/2015    Years since quitting: 8.4   Smokeless tobacco: Former  Building services engineer status: Never Used  Substance and Sexual Activity   Alcohol use: Yes    Alcohol/week: 56.0 standard drinks of alcohol    Types: 56 Cans of beer per week    Comment: "I sit around and drink beer, that's all I got to do"   Drug use: No   Sexual activity: Not Currently  Other Topics Concern   Not on file  Social History Narrative   Not on file   Social Drivers of Health   Financial Resource Strain: Not on file  Food Insecurity: No Food Insecurity (12/29/2023)   Hunger Vital Sign    Worried About Running Out of Food in the Last Year: Never true    Ran Out of Food in the Last Year: Never true  Transportation Needs: Unmet Transportation Needs (01/27/2024)   PRAPARE - Administrator, Civil Service (Medical): Yes    Lack of Transportation (Non-Medical): Yes  Physical Activity: Not on file  Stress: Not on file  Social Connections: Socially Isolated (12/29/2023)   Social Connection and Isolation Panel [NHANES]    Frequency of Communication with Friends and Family: Three times a week    Frequency of Social Gatherings with Friends and Family: Once a week    Attends Religious Services: Never    Database administrator or Organizations: No    Attends Banker Meetings: Never    Marital Status: Divorced  Catering manager Violence: Not At Risk (12/29/2023)   Humiliation, Afraid, Rape, and Kick questionnaire    Fear of Current or Ex-Partner: No    Emotionally Abused: No    Physically Abused: No    Sexually Abused: No    FAMILY HISTORY: Family History  Problem Relation Age of Onset   Heart disease Mother     ALLERGIES:  is allergic to amoxicillin  and tizanidine .  MEDICATIONS:  Current Outpatient Medications  Medication Sig Dispense Refill   acetaminophen   (TYLENOL ) 500 MG tablet Take 1,000 mg by mouth every 6 (six) hours as needed for moderate pain or headache.     albuterol  (VENTOLIN  HFA) 108 (90 Base) MCG/ACT inhaler Inhale 2 puffs into the lungs every 6 (six) hours as needed for wheezing or shortness of breath. 8 g 11   amLODipine  (NORVASC ) 5 MG tablet Take 1 tablet (5 mg total) by mouth at bedtime. Skip the dose if systolic BP less than 130 mmHg 30 tablet 5   aspirin  EC 81 MG tablet Take 1 tablet (81 mg total) by mouth daily. 90 tablet 3   atorvastatin  (LIPITOR) 40 MG tablet Take 1 tablet (40 mg total) by mouth at bedtime. 90 tablet 3   CALCIUM  600/VITAMIN D  600-10 MG-MCG TABS Take  1 tablet by mouth 2 (two) times daily.     clopidogrel  (PLAVIX ) 75 MG tablet Take 1 tablet (75 mg total) by mouth daily. 90 tablet 3   Dupilumab  (DUPIXENT ) 300 MG/2ML SOPN Inject 300 mg into the skin every 14 (fourteen) days. 12 mL 1   EPINEPHrine  0.3 mg/0.3 mL IJ SOAJ injection Inject 0.3 mg into the muscle as needed for anaphylaxis.     gabapentin  (NEURONTIN ) 300 MG capsule TAKE 1 CAPSULE BY MOUTH THREE TIMES A DAY 90 capsule 1   guaiFENesin -dextromethorphan  (ROBITUSSIN DM) 100-10 MG/5ML syrup Take 10 mLs by mouth every 6 (six) hours as needed for cough. 118 mL 0   ipratropium-albuterol  (DUONEB) 0.5-2.5 (3) MG/3ML SOLN INHALE 1 VIAL THROUGH NEBULIZER EVERY 6 HOURS (Patient taking differently: Take 3 mLs by nebulization every 6 (six) hours as needed.) 270 mL 2   lidocaine -prilocaine  (EMLA ) cream Apply to affected area once 30 g 3   magnesium  oxide (MAG-OX) 400 (241.3 Mg) MG tablet Take 1 tablet (400 mg total) by mouth daily. 30 tablet 0   metoprolol  succinate (TOPROL -XL) 50 MG 24 hr tablet Take 50 mg by mouth daily.     montelukast  (SINGULAIR ) 10 MG tablet Take 10 mg by mouth daily.     Multiple Vitamin (MULTIVITAMIN WITH MINERALS) TABS tablet Take 1 tablet by mouth daily. 30 tablet 0   oxyCODONE  (OXY IR/ROXICODONE ) 5 MG immediate release tablet Take 1 tablet (5 mg  total) by mouth every 8 (eight) hours as needed for severe pain (pain score 7-10). 90 tablet 0   pantoprazole  (PROTONIX ) 40 MG tablet Take 1 tablet (40 mg total) by mouth 2 (two) times daily. 90 tablet 3   potassium chloride  SA (KLOR-CON  M) 20 MEQ tablet Take 20 mEq by mouth daily.     predniSONE  (DELTASONE ) 10 MG tablet TAKE 1 TABLET (10 MG TOTAL) BY MOUTH DAILY AS NEEDED. TAKE AS DIRECTED. 30 tablet 1   Spacer/Aero-Holding Chambers (AEROCHAMBER MV) inhaler Use as instructed 1 each 0   ferrous sulfate  325 (65 FE) MG tablet Take 1 tablet (325 mg total) by mouth 2 (two) times daily with a meal. (Patient not taking: Reported on 01/27/2024) 60 tablet 2   omega-3 acid ethyl esters (LOVAZA) 1 g capsule Take 1 g by mouth daily. (Patient not taking: Reported on 03/23/2024)     sucralfate  (CARAFATE ) 1 g tablet Take 1 tablet (1 g total) by mouth 3 (three) times daily before meals. Dissolve in 4tbs warm water , swish and swallow (Patient not taking: Reported on 03/23/2024) 90 tablet 4   No current facility-administered medications for this visit.   Facility-Administered Medications Ordered in Other Visits  Medication Dose Route Frequency Provider Last Rate Last Admin   0.9 %  sodium chloride  infusion   Intravenous Continuous [name], MD 10 mL/hr at 03/23/24 0929 New Bag at 03/23/24 0929   gemcitabine (GEMZAR) 1,976 mg in sodium chloride  0.9 % 250 mL chemo infusion  1,000 mg/m2 (Treatment Plan Recorded) Intravenous Once [name], MD 604 mL/hr at 03/23/24 1003 1,976 mg at 03/23/24 1003    Review of Systems  Constitutional:  Positive for fatigue. Negative for appetite change, chills and fever.  HENT:   Negative for hearing loss and voice change.   Eyes:  Negative for eye problems and icterus.  Respiratory:  Positive for shortness of breath. Negative for chest tightness and cough.   Cardiovascular:  Negative for chest pain and leg swelling.  Gastrointestinal:  Negative for abdominal distention and abdominal  pain.  Endocrine: Negative for hot flashes.  Genitourinary:  Negative for difficulty urinating, dysuria and frequency.   Musculoskeletal:  Negative for arthralgias.  Skin:  Negative for itching and rash.  Neurological:  Negative for light-headedness and numbness.  Hematological:  Negative for adenopathy. Does not bruise/bleed easily.  Psychiatric/Behavioral:  Negative for confusion.      PHYSICAL EXAMINATION: ECOG PERFORMANCE STATUS: 2 - Symptomatic, <50% confined to bed  Vitals:   03/23/24 0854 03/23/24 0855  BP: (!) 157/94 (!) 156/91  Pulse: 91 89  Resp:    Temp:    SpO2:     Filed Weights   03/23/24 0848  Weight: 183 lb 14.4 oz (83.4 kg)    Physical Exam Constitutional:      General: He is not in acute distress.    Appearance: He is not diaphoretic.  HENT:     Head: Normocephalic and atraumatic.     Mouth/Throat:     Pharynx: No oropharyngeal exudate.  Eyes:     General: No scleral icterus.    Pupils: Pupils are equal, round, and reactive to light.  Cardiovascular:     Rate and Rhythm: Normal rate and regular rhythm.  Pulmonary:     Effort: Pulmonary effort is normal. No respiratory distress.     Comments: Decreased breath sounds bilaterally. On nasal cannula oxygen  Abdominal:     General: Bowel sounds are normal. There is no distension.     Palpations: Abdomen is soft.  Musculoskeletal:        General: Normal range of motion.     Cervical back: Normal range of motion and neck supple.  Skin:    General: Skin is warm and dry.     Findings: No erythema.  Neurological:     Mental Status: He is alert and oriented to person, place, and time. Mental status is at baseline.     Motor: No abnormal muscle tone.  Psychiatric:        Mood and Affect: Affect normal.      LABORATORY DATA:  I have reviewed the data as listed    Latest Ref Rng & Units 03/23/2024    8:14 AM 03/02/2024    9:38 AM 02/24/2024    8:44 AM  CBC  WBC 4.0 - 10.5 K/uL 5.2  3.3  4.2    Hemoglobin 13.0 - 17.0 g/dL 16.1  9.8  9.6   Hematocrit 39.0 - 52.0 % 32.3  29.0  28.6   Platelets 150 - 400 K/uL 114  227  186       Latest Ref Rng & Units 03/23/2024    8:14 AM 03/02/2024    9:38 AM 02/24/2024    8:44 AM  CMP  Glucose 70 - 99 mg/dL 096  045  409   BUN 8 - 23 mg/dL 16  14  11    Creatinine 0.61 - 1.24 mg/dL 8.11  9.14  7.82   Sodium 135 - 145 mmol/L 134  133  134   Potassium 3.5 - 5.1 mmol/L 4.2  4.0  3.7   Chloride 98 - 111 mmol/L 100  99  97   CO2 22 - 32 mmol/L 25  24  28    Calcium  8.9 - 10.3 mg/dL 8.3  8.8  8.5   Total Protein 6.5 - 8.1 g/dL 6.6  6.7  6.4   Total Bilirubin 0.0 - 1.2 mg/dL 0.9  0.7  0.7   Alkaline Phos 38 - 126 U/L 55  54  51  AST 15 - 41 U/L 20  28  27    ALT 0 - 44 U/L 16  26  25       RADIOGRAPHIC STUDIES: I have personally reviewed the radiological images as listed and agreed with the findings in the report. CT CHEST ABDOMEN PELVIS W CONTRAST Result Date: 03/17/2024 CLINICAL DATA:  Restaging lung cancer. Chemotherapy. Shortness of breath on exertion with cough. * Tracking Code: BO * EXAM: CT CHEST, ABDOMEN, AND PELVIS WITH CONTRAST TECHNIQUE: Multidetector CT imaging of the chest, abdomen and pelvis was performed following the standard protocol during bolus administration of intravenous contrast. RADIATION DOSE REDUCTION: This exam was performed according to the departmental dose-optimization program which includes automated exposure control, adjustment of the mA and/or kV according to patient size and/or use of iterative reconstruction technique. CONTRAST:  85mL OMNIPAQUE  IOHEXOL  300 MG/ML  SOLN COMPARISON:  CTA chest 12/29/2023 and chest x-ray. PET-CT scan 12/14/2023. CT chest abdomen pelvis 11/26/2023. Numerous older exams as well. FINDINGS: CT CHEST FINDINGS Cardiovascular: Heart is nonenlarged. No pericardial effusion. Coronary artery calcifications are seen. The thoracic aorta has a normal course and caliber with scattered calcified  atherosclerotic plaque. There is also calcified plaque along the great vessels. Left IJ chest port in place with tip of the catheter extending to the central SVC above the right atrium. Mediastinum/Nodes: Slightly patulous thoracic esophagus. Preserved visualized portions of the thyroid  gland. No abnormal lymph node enlargement identified in the axillary region, hilum or mediastinum. On the previous examination there were several prominent mediastinal nodes. These have improved. Precarinal node which previously measured 14 mm in short axis on the prior, today measures 8 mm. On the study of February 2025 this measured 13 mm in short axis. Subcarinal node near the right hilum on the previous which measured 11 mm in February 2025, today measures 5 mm. Other peritoneal tracheal nodes are improving. Slight wall thickening along the trachea and central bronchi is improving. No new nodal enlargement identified. Lungs/Pleura: Advanced emphysematous lung changes are identified. Predominately centrilobular with some paraseptal changes. Large bulla formation identified along the lower lateral right hemithorax. There is scattered areas of mild ground-glass. And atelectatic change. Some ill-defined opacities in the right lower lobe are also noted. No pleural effusion or pneumothorax. Dominant lung masses are identified once again in the middle lobe. On the study of March 2025 this measured 6.5 x 6.4 cm. This had increased from the earlier study. Today this lesion on image 90 of series 3 measures 5.5 by 5.0 cm, smaller. The left lower lobe lesion which in March 2025 measured 5.5 x 3.8 cm, today measures 4.5 x 3.2 cm. There is a new small middle lobe nodule measuring 4 mm on series 3, image 73. Few punctate calcified nodules identified. Stable mild parenchymal opacity along the extreme inferior posterior right lower lobe. Musculoskeletal: Curvature and degenerative changes along the spine. Degenerative changes of the shoulders.  There is cement seen along vertebral bodies at T10 and T6 with compression deformities. Appearance is similar to previous. CT ABDOMEN PELVIS FINDINGS Hepatobiliary: Diffuse fatty liver infiltration. No space-occupying liver lesion. Patent portal vein. Gallbladder is distended. Pancreas: Unremarkable. No pancreatic ductal dilatation or surrounding inflammatory changes. Spleen: Normal in size without focal abnormality. Adrenals/Urinary Tract: Bilateral adrenal nodules are again identified. Again felt to be adenomas on previous examinations. Areas of atrophy along both kidneys. Tiny cystic foci are also again seen. No clear enhancing mass or collecting system dilatation. The ureters have normal course and caliber extending down  to the urinary bladder. Preserved contour to the urinary bladder. Stomach/Bowel: Stomach is underdistended. There is appearance of some fold thickening but unchanged from previous. Please correlate with any symptoms. Small bowel is nondilated. Large bowel has a normal course and caliber with scattered stool. Few colonic diverticula. Normal appendix. Vascular/Lymphatic: Extensive vascular calcifications along the aorta and branch vessels. Areas of stenosis suggested along the distal abdominal aorta and the common femoral vessels in particular. Please correlate for any symptoms. On the prior there is an enlarged hypermetabolic lymph node near the porta hepatis anterior to the course of the hepatic artery. On previous examination this measured 14 mm in short axis in February 2025 and today 12 mm on image 60 of series 2. No new areas of nodal enlargement. Reproductive: Prostate is unremarkable. Other: No free air or free fluid. Musculoskeletal: Scattered degenerative changes of the spine and pelvis. Curvature of the lumbar spine. Pars defects at L5. Trace listhesis. IMPRESSION: Overall slightly improved from previous examination. The large lung masses involving the middle lobe and left lower lobe  appears slightly smaller today. Lymph nodes in the mediastinum, right hilum and upper abdomen are also slightly smaller. No new mass lesion, lymph node enlargement identified. Extensive vascular calcifications with some areas of stenosis particularly the common femoral arteries bilaterally. Please correlate with symptoms. Fatty liver infiltration. Advanced emphysematous lung changes. One new small right-sided lung 4 mm nodule identified. Recommend attention on follow-up. Electronically Signed   By: Adrianna Horde M.D.   On: 03/17/2024 19:11

## 2024-03-29 ENCOUNTER — Ambulatory Visit: Admitting: Radiation Oncology

## 2024-03-31 ENCOUNTER — Encounter

## 2024-03-31 ENCOUNTER — Ambulatory Visit

## 2024-03-31 ENCOUNTER — Ambulatory Visit: Admitting: Oncology

## 2024-03-31 ENCOUNTER — Other Ambulatory Visit

## 2024-04-03 ENCOUNTER — Inpatient Hospital Stay (HOSPITAL_BASED_OUTPATIENT_CLINIC_OR_DEPARTMENT_OTHER): Admitting: Oncology

## 2024-04-03 ENCOUNTER — Encounter: Payer: Self-pay | Admitting: Oncology

## 2024-04-03 ENCOUNTER — Inpatient Hospital Stay

## 2024-04-03 VITALS — BP 174/81 | HR 87 | Temp 96.0°F | Resp 18 | Wt 185.1 lb

## 2024-04-03 DIAGNOSIS — Z5111 Encounter for antineoplastic chemotherapy: Secondary | ICD-10-CM | POA: Diagnosis not present

## 2024-04-03 DIAGNOSIS — F1090 Alcohol use, unspecified, uncomplicated: Secondary | ICD-10-CM | POA: Diagnosis not present

## 2024-04-03 DIAGNOSIS — C3491 Malignant neoplasm of unspecified part of right bronchus or lung: Secondary | ICD-10-CM

## 2024-04-03 DIAGNOSIS — D7589 Other specified diseases of blood and blood-forming organs: Secondary | ICD-10-CM

## 2024-04-03 DIAGNOSIS — D696 Thrombocytopenia, unspecified: Secondary | ICD-10-CM

## 2024-04-03 DIAGNOSIS — J4489 Other specified chronic obstructive pulmonary disease: Secondary | ICD-10-CM

## 2024-04-03 LAB — CBC WITH DIFFERENTIAL (CANCER CENTER ONLY)
Abs Immature Granulocytes: 0.02 10*3/uL (ref 0.00–0.07)
Basophils Absolute: 0 10*3/uL (ref 0.0–0.1)
Basophils Relative: 0 %
Eosinophils Absolute: 0 10*3/uL (ref 0.0–0.5)
Eosinophils Relative: 1 %
HCT: 27.9 % — ABNORMAL LOW (ref 39.0–52.0)
Hemoglobin: 9.4 g/dL — ABNORMAL LOW (ref 13.0–17.0)
Immature Granulocytes: 1 %
Lymphocytes Relative: 12 %
Lymphs Abs: 0.4 10*3/uL — ABNORMAL LOW (ref 0.7–4.0)
MCH: 34.3 pg — ABNORMAL HIGH (ref 26.0–34.0)
MCHC: 33.7 g/dL (ref 30.0–36.0)
MCV: 101.8 fL — ABNORMAL HIGH (ref 80.0–100.0)
Monocytes Absolute: 0.4 10*3/uL (ref 0.1–1.0)
Monocytes Relative: 12 %
Neutro Abs: 2.6 10*3/uL (ref 1.7–7.7)
Neutrophils Relative %: 74 %
Platelet Count: 79 10*3/uL — ABNORMAL LOW (ref 150–400)
RBC: 2.74 MIL/uL — ABNORMAL LOW (ref 4.22–5.81)
RDW: 17.2 % — ABNORMAL HIGH (ref 11.5–15.5)
WBC Count: 3.5 10*3/uL — ABNORMAL LOW (ref 4.0–10.5)
nRBC: 0 % (ref 0.0–0.2)

## 2024-04-03 LAB — CMP (CANCER CENTER ONLY)
ALT: 49 U/L — ABNORMAL HIGH (ref 0–44)
AST: 36 U/L (ref 15–41)
Albumin: 4.2 g/dL (ref 3.5–5.0)
Alkaline Phosphatase: 71 U/L (ref 38–126)
Anion gap: 11 (ref 5–15)
BUN: 15 mg/dL (ref 8–23)
CO2: 25 mmol/L (ref 22–32)
Calcium: 9.1 mg/dL (ref 8.9–10.3)
Chloride: 95 mmol/L — ABNORMAL LOW (ref 98–111)
Creatinine: 0.75 mg/dL (ref 0.61–1.24)
GFR, Estimated: >60 mL/min (ref >60)
Glucose, Bld: 119 mg/dL — ABNORMAL HIGH (ref 70–99)
Potassium: 4.1 mmol/L (ref 3.5–5.1)
Sodium: 131 mmol/L — ABNORMAL LOW (ref 135–145)
Total Bilirubin: 0.8 mg/dL (ref 0.0–1.2)
Total Protein: 6.8 g/dL (ref 6.5–8.1)

## 2024-04-03 LAB — VITAMIN B12: Vitamin B-12: 301 pg/mL (ref 180–914)

## 2024-04-03 MED ORDER — OXYCODONE HCL 5 MG PO TABS: 5.0000 mg | ORAL_TABLET | Freq: Three times a day (TID) | ORAL | 0 refills | Status: DC | PRN

## 2024-04-03 MED ORDER — HEPARIN SOD (PORK) LOCK FLUSH 100 UNIT/ML IV SOLN
500.0000 [IU] | Freq: Once | INTRAVENOUS | Status: AC
  Filled 2024-04-03: qty 5

## 2024-04-03 MED ORDER — LIDOCAINE-PRILOCAINE 2.5-2.5 % EX CREA: TOPICAL_CREAM | CUTANEOUS | 5 refills | Status: DC

## 2024-04-03 NOTE — Progress Notes (Signed)
 Hematology/Oncology Progress note [phone] [phone]        CHIEF COMPLAINTS/PURPOSE OF CONSULTATION:  Stage IV lung adenocarcinoma.   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  s/p dose reduced carboplatin , Taxol  and Keytruda  on 06/01/2023 x 4 cycles, poor PS/hospitalization--> Keytruda  maintenance until Feb 2025 --> PET progression--> 01/05/2024 concurrent chemo RT --> May 2025 CT showed slight decrease of lung mass--> June 2025 Gemcitabine .  Currently on Gemcitabine  D1 D8 Q21 days as systemic maintenance.  Labs are reviewed and discussed with patient. Hold off D8 Gemcitabine  due to thrombocytopenia    Alcohol use Alcohol cessation is discussed with patient.   Asthma-COPD overlap syndrome (HCC) Chronic respiratory failure on Oxygen .  Continue inhalers.    Hypocalcemia Continue calcium  1200mg  and Vitamin D  supplementation.  Calcium  has improved.   Macrocytosis He drinks alcohol.  Recommend empiric B12 1000mcg supplementation.   Thrombocytopenia (HCC) Check B12 and folate Hold chemotherapy.  Recommend alcohol cessation.   Orders Placed This Encounter  Procedures   Vitamin B12    Standing Status:   Future    Expected Date:   04/10/2024    Expiration Date:   04/10/2025   CBC with Differential (Cancer Center Only)    Standing Status:   Future    Expected Date:   04/10/2024    Expiration Date:   04/10/2025   CMP (Cancer Center only)    Standing Status:   Future    Expected Date:   04/10/2024    Expiration Date:   04/10/2025   Follow up in 1 week.  All questions were answered. The patient knows to call the clinic with any problems, questions or concerns.  Austin Forbes, MD, PhD [facility] Hematology  Oncology 04/03/2024    HISTORY OF PRESENTING ILLNESS:  Austin Patterson 68 y.o. male presents to establish care for Stage IV lung adenocarcinoma.  Patient previously followed up with Dr. Aris Bel who left practice. He establish care with on 03/02/2024  I have reviewed his chart and materials related to his cancer extensively and collaborated history with the patient. Summary of oncologic history is as follows: Oncology History  Stage IV adenocarcinoma of lung (HCC)  04/19/2023 Imaging   PET scan showed 1. 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 2. Right lower lobe hypermetabolic pulmonary nodule, favoring synchronous primary over pulmonary metastasis. 3. Incidental findings, including: Aortic atherosclerosis (ICD10-I70.0), coronary artery atherosclerosis and emphysema (ICD10-J43.9). Prostatomegaly. Sinus disease.   05/18/2023 Initial Diagnosis   Sarcomatoid carcinoma of lung (HCC)   05/20/2023 Cancer Staging   Staging form: Lung, AJCC 8th Edition - Clinical: Stage IV (cT4, cN3, cM1) - Signed by Agrawal, Kavita, MD on 05/20/2023 Stage prefix: Initial diagnosis   06/01/2023 - 11/08/2023 Chemotherapy   Patient is on Treatment Plan : LUNG NSCLC Carboplatin  (6) + Paclitaxel  (200) + Pembrolizumab  (200) D1 q21d x 4 cycles / Pembrolizumab  (200) Maintenance D1 q21d     08/30/2023 Imaging   CT chest with contrast improving right middle lobe mass, corresponding to the patient's known primary bronchogenic carcinoma. Small mediastinal and right perihilar nodal metastases are also improving.   Associated bilateral lower lobe pulmonary metastases, overall improved, although one right lower lobe nodular  opacity is mildly progressive. Attention on follow-up is suggested.   Stable bilateral adrenal adenomas.   Aortic Atherosclerosis (ICD10-I70.0) and Emphysema (ICD10-J43.9).    11/26/2023 Imaging   CT chest abdomen pelvis with contrast 1. Interval progression of disease. The dominant mass within the right middle lobe has increased in size in the interval. There is also been significant increase in size of left lower lobe metastasis. Stable to mild increase in size of metastasis to the posterior right lower lobe. 2. Stable appearance of mediastinal and right hilar lymph nodes. 3. No signs of metastatic disease within the abdomen or pelvis. 4. Aortic Atherosclerosis (ICD10-I70.0) and Emphysema (ICD10-J43.9).   12/14/2023 Imaging   PET scan showed 1. Disease progression as evidenced by hypermetabolic nodules and masses in the lungs bilaterally as well as hypermetabolic metastatic mediastinal, hilar and periportal lymph nodes. 2. Bilateral adrenal adenomas. 3. Gastric wall thickening. 4. Aortic atherosclerosis (ICD10-I70.0). Coronary artery calcification. 5.  Emphysema (ICD10-J43.9)   01/06/2024 - 03/02/2024 Chemotherapy   Patient is on Treatment Plan : LUNG Carboplatin  + Paclitaxel  + XRT q7d     03/17/2024 Imaging   CT chest abdomen pelvis w contrast showed Overall slightly improved from previous examination. The large lung masses involving the middle lobe and left lower lobe appears slightly smaller today. Lymph nodes in the mediastinum, right hilum and upper abdomen are also slightly smaller. No new mass lesion, lymph node enlargement identified. Extensive vascular calcifications with some areas of stenosis particularly the common femoral arteries bilaterally. Please correlate with symptoms. Fatty liver infiltration.   Advanced emphysematous lung changes.   One new small right-sided lung 4 mm nodule identified. Recommend attention on follow-up.     03/23/2024 -  Chemotherapy   Patient is on Treatment Plan : LUNG Gemcitabine  (1000) D1,8 q21d      He reports feeling ok today.  + increased SOB to his baseline level. Chronic respiratory failure No nausea  vomiting diarrhea.  He drinks 4-5 cans of beer on some days.   MEDICAL HISTORY:  Past Medical History:  Diagnosis Date   Abdominal pain 12/07/2022   Acute urinary retention 12/07/2022   Chest pain 01/24/2022   COPD (chronic obstructive pulmonary disease) (HCC)    GERD (gastroesophageal reflux disease)    Hemothorax on right 05/11/2023   Hyperlipidemia    Hypertension    Pleuritic pain 05/10/2023   Pneumothorax after biopsy 05/11/2023   Post procedure discomfort 05/13/2023   Pulmonary fibrosis (HCC) 11/2015    SURGICAL HISTORY: Past Surgical History:  Procedure Laterality Date   COLONOSCOPY     CORONARY STENT PLACEMENT     ESOPHAGOGASTRODUODENOSCOPY (EGD) WITH PROPOFOL  N/A 09/23/2016   Procedure: ESOPHAGOGASTRODUODENOSCOPY (EGD) WITH PROPOFOL ;  Surgeon: Luke Salaam, MD;  Location: [facility];  Service: Endoscopy;  Laterality: N/A;   IR IMAGING GUIDED PORT INSERTION  05/26/2023   IR IMAGING GUIDED PORT INSERTION  07/30/2023   IR RADIOLOGIST EVAL & MGMT  07/20/2023   KYPHOPLASTY N/A 03/14/2020   Procedure: T7 & T11 KYPHOPLASTY;  Surgeon: Molli Angelucci, MD;  Location: [facility] ORS;  Service: Orthopedics;  Laterality: N/A;   PORT-A-CATH REMOVAL N/A 06/14/2023   Procedure: REMOVAL PORT-A-CATH;  Surgeon: Conrado Delay, DO;  Location: [facility] ORS;  Service: General;  Laterality: N/A;   SHOULDER ACROMIOPLASTY      SOCIAL HISTORY: Social History   Socioeconomic History   Marital status: Divorced    Spouse name: Not on file   Number of children: Not on file   Years of education:  Not on file   Highest education level: Not on file  Occupational History   Not on file  Tobacco Use   Smoking status: Former    Current packs/day: 0.00    Average packs/day: 2.0 packs/day for 50.0 years (100.0 ttl pk-yrs)    Types: Cigarettes    Start date: 10/13/1965    Quit date: 10/14/2015    Years since quitting: 8.4   Smokeless tobacco: Former  Building services engineer status: Never Used  Substance and  Sexual Activity   Alcohol use: Yes    Alcohol/week: 56.0 standard drinks of alcohol    Types: 56 Cans of beer per week    Comment: I sit around and drink beer, that's all I got to do   Drug use: No   Sexual activity: Not Currently  Other Topics Concern   Not on file  Social History Narrative   Not on file   Social Drivers of Health   Financial Resource Strain: Not on file  Food Insecurity: No Food Insecurity (12/29/2023)   Hunger Vital Sign    Worried About Running Out of Food in the Last Year: Never true    Ran Out of Food in the Last Year: Never true  Transportation Needs: Unmet Transportation Needs (01/27/2024)   PRAPARE - Administrator, Civil Service (Medical): Yes    Lack of Transportation (Non-Medical): Yes  Physical Activity: Not on file  Stress: Not on file  Social Connections: Socially Isolated (12/29/2023)   Social Connection and Isolation Panel    Frequency of Communication with Friends and Family: Three times a week    Frequency of Social Gatherings with Friends and Family: Once a week    Attends Religious Services: Never    Database administrator or Organizations: No    Attends Banker Meetings: Never    Marital Status: Divorced  Catering manager Violence: Not At Risk (12/29/2023)   Humiliation, Afraid, Rape, and Kick questionnaire    Fear of Current or Ex-Partner: No    Emotionally Abused: No    Physically Abused: No    Sexually Abused: No    FAMILY HISTORY: Family History  Problem Relation Age of Onset   Heart disease Mother     ALLERGIES:  is allergic to amoxicillin  and tizanidine .  MEDICATIONS:  Current Outpatient Medications  Medication Sig Dispense Refill   acetaminophen  (TYLENOL ) 500 MG tablet Take 1,000 mg by mouth every 6 (six) hours as needed for moderate pain or headache.     albuterol  (VENTOLIN  HFA) 108 (90 Base) MCG/ACT inhaler Inhale 2 puffs into the lungs every 6 (six) hours as needed for wheezing or shortness of  breath. 8 g 11   amLODipine  (NORVASC ) 5 MG tablet Take 1 tablet (5 mg total) by mouth at bedtime. Skip the dose if systolic BP less than 130 mmHg 30 tablet 5   aspirin  EC 81 MG tablet Take 1 tablet (81 mg total) by mouth daily. 90 tablet 3   atorvastatin  (LIPITOR) 40 MG tablet Take 1 tablet (40 mg total) by mouth at bedtime. 90 tablet 3   CALCIUM  600/VITAMIN D  600-10 MG-MCG TABS Take 1 tablet by mouth 2 (two) times daily.     clopidogrel  (PLAVIX ) 75 MG tablet Take 1 tablet (75 mg total) by mouth daily. 90 tablet 3   Dupilumab  (DUPIXENT ) 300 MG/2ML SOPN Inject 300 mg into the skin every 14 (fourteen) days. 12 mL 1   EPINEPHrine  0.3 mg/0.3 mL IJ  SOAJ injection Inject 0.3 mg into the muscle as needed for anaphylaxis.     gabapentin  (NEURONTIN ) 300 MG capsule TAKE 1 CAPSULE BY MOUTH THREE TIMES A DAY 90 capsule 1   guaiFENesin -dextromethorphan  (ROBITUSSIN DM) 100-10 MG/5ML syrup Take 10 mLs by mouth every 6 (six) hours as needed for cough. 118 mL 0   ipratropium-albuterol  (DUONEB) 0.5-2.5 (3) MG/3ML SOLN INHALE 1 VIAL THROUGH NEBULIZER EVERY 6 HOURS (Patient taking differently: Take 3 mLs by nebulization every 6 (six) hours as needed.) 270 mL 2   magnesium  oxide (MAG-OX) 400 (241.3 Mg) MG tablet Take 1 tablet (400 mg total) by mouth daily. 30 tablet 0   metoprolol  succinate (TOPROL -XL) 50 MG 24 hr tablet Take 50 mg by mouth daily.     montelukast  (SINGULAIR ) 10 MG tablet Take 10 mg by mouth daily.     Multiple Vitamin (MULTIVITAMIN WITH MINERALS) TABS tablet Take 1 tablet by mouth daily. 30 tablet 0   omega-3 acid ethyl esters (LOVAZA) 1 g capsule Take 1 g by mouth daily.     pantoprazole  (PROTONIX ) 40 MG tablet Take 1 tablet (40 mg total) by mouth 2 (two) times daily. 90 tablet 3   potassium chloride  SA (KLOR-CON  M) 20 MEQ tablet Take 20 mEq by mouth daily.     predniSONE  (DELTASONE ) 10 MG tablet TAKE 1 TABLET (10 MG TOTAL) BY MOUTH DAILY AS NEEDED. TAKE AS DIRECTED. 30 tablet 1   Spacer/Aero-Holding  Chambers (AEROCHAMBER MV) inhaler Use as instructed 1 each 0   sucralfate  (CARAFATE ) 1 g tablet Take 1 tablet (1 g total) by mouth 3 (three) times daily before meals. Dissolve in 4tbs warm water , swish and swallow 90 tablet 4   ferrous sulfate  325 (65 FE) MG tablet Take 1 tablet (325 mg total) by mouth 2 (two) times daily with a meal. (Patient not taking: Reported on 04/03/2024) 60 tablet 2   lidocaine -prilocaine  (EMLA ) cream Apply to affected area once 30 g 5   oxyCODONE  (OXY IR/ROXICODONE ) 5 MG immediate release tablet Take 1 tablet (5 mg total) by mouth every 8 (eight) hours as needed for severe pain (pain score 7-10). 90 tablet 0   No current facility-administered medications for this visit.   Facility-Administered Medications Ordered in Other Visits  Medication Dose Route Frequency Provider Last Rate Last Admin   heparin  lock flush 100 unit/mL  500 Units Intravenous Once Yazmeen Woolf, MD        Review of Systems  Constitutional:  Positive for fatigue. Negative for appetite change, chills and fever.  HENT:   Negative for hearing loss and voice change.   Eyes:  Negative for eye problems and icterus.  Respiratory:  Positive for shortness of breath. Negative for chest tightness and cough.   Cardiovascular:  Negative for chest pain and leg swelling.  Gastrointestinal:  Negative for abdominal distention and abdominal pain.  Endocrine: Negative for hot flashes.  Genitourinary:  Negative for difficulty urinating, dysuria and frequency.   Musculoskeletal:  Negative for arthralgias.  Skin:  Negative for itching and rash.  Neurological:  Negative for light-headedness and numbness.  Hematological:  Negative for adenopathy. Does not bruise/bleed easily.  Psychiatric/Behavioral:  Negative for confusion.      PHYSICAL EXAMINATION: ECOG PERFORMANCE STATUS: 2 - Symptomatic, <50% confined to bed  Vitals:   04/03/24 0904  BP: (!) 174/81  Pulse: 87  Resp: 18  Temp: (!) 96 F (35.6 C)  SpO2: 100%    Filed Weights   04/03/24 0904  Weight:  185 lb 1.6 oz (84 kg)    Physical Exam Constitutional:      General: He is not in acute distress.    Appearance: He is not diaphoretic.  HENT:     Head: Normocephalic and atraumatic.     Mouth/Throat:     Pharynx: No oropharyngeal exudate.   Eyes:     General: No scleral icterus.    Pupils: Pupils are equal, round, and reactive to light.    Cardiovascular:     Rate and Rhythm: Normal rate and regular rhythm.  Pulmonary:     Effort: Pulmonary effort is normal. No respiratory distress.     Comments: Decreased breath sounds bilaterally. On nasal cannula oxygen  Abdominal:     General: Bowel sounds are normal. There is no distension.     Palpations: Abdomen is soft.   Musculoskeletal:        General: Normal range of motion.     Cervical back: Normal range of motion and neck supple.   Skin:    General: Skin is warm and dry.     Findings: No erythema.   Neurological:     Mental Status: He is alert and oriented to person, place, and time. Mental status is at baseline.     Motor: No abnormal muscle tone.   Psychiatric:        Mood and Affect: Affect normal.      LABORATORY DATA:  I have reviewed the data as listed    Latest Ref Rng & Units 04/03/2024    8:57 AM 03/23/2024    8:14 AM 03/02/2024    9:38 AM  CBC  WBC 4.0 - 10.5 K/uL 3.5  5.2  3.3   Hemoglobin 13.0 - 17.0 g/dL 9.4  16.1  9.8   Hematocrit 39.0 - 52.0 % 27.9  32.3  29.0   Platelets 150 - 400 K/uL 79  114  227       Latest Ref Rng & Units 04/03/2024    8:57 AM 03/23/2024    8:14 AM 03/02/2024    9:38 AM  CMP  Glucose 70 - 99 mg/dL 096  045  409   BUN 8 - 23 mg/dL 15  16  14    Creatinine 0.61 - 1.24 mg/dL 8.11  9.14  7.82   Sodium 135 - 145 mmol/L 131  134  133   Potassium 3.5 - 5.1 mmol/L 4.1  4.2  4.0   Chloride 98 - 111 mmol/L 95  100  99   CO2 22 - 32 mmol/L 25  25  24    Calcium  8.9 - 10.3 mg/dL 9.1  8.3  8.8   Total Protein 6.5 - 8.1 g/dL 6.8  6.6  6.7    Total Bilirubin 0.0 - 1.2 mg/dL 0.8  0.9  0.7   Alkaline Phos 38 - 126 U/L 71  55  54   AST 15 - 41 U/L 36  20  28   ALT 0 - 44 U/L 49  16  26      RADIOGRAPHIC STUDIES: I have personally reviewed the radiological images as listed and agreed with the findings in the report. CT CHEST ABDOMEN PELVIS W CONTRAST Result Date: 03/17/2024 CLINICAL DATA:  Restaging lung cancer. Chemotherapy. Shortness of breath on exertion with cough. * Tracking Code: BO * EXAM: CT CHEST, ABDOMEN, AND PELVIS WITH CONTRAST TECHNIQUE: Multidetector CT imaging of the chest, abdomen and pelvis was performed following the standard protocol during bolus administration of intravenous contrast.  RADIATION DOSE REDUCTION: This exam was performed according to the departmental dose-optimization program which includes automated exposure control, adjustment of the mA and/or kV according to patient size and/or use of iterative reconstruction technique. CONTRAST:  85mL OMNIPAQUE  IOHEXOL  300 MG/ML  SOLN COMPARISON:  CTA chest 12/29/2023 and chest x-ray. PET-CT scan 12/14/2023. CT chest abdomen pelvis 11/26/2023. Numerous older exams as well. FINDINGS: CT CHEST FINDINGS Cardiovascular: Heart is nonenlarged. No pericardial effusion. Coronary artery calcifications are seen. The thoracic aorta has a normal course and caliber with scattered calcified atherosclerotic plaque. There is also calcified plaque along the great vessels. Left IJ chest port in place with tip of the catheter extending to the central SVC above the right atrium. Mediastinum/Nodes: Slightly patulous thoracic esophagus. Preserved visualized portions of the thyroid  gland. No abnormal lymph node enlargement identified in the axillary region, hilum or mediastinum. On the previous examination there were several prominent mediastinal nodes. These have improved. Precarinal node which previously measured 14 mm in short axis on the prior, today measures 8 mm. On the study of February 2025  this measured 13 mm in short axis. Subcarinal node near the right hilum on the previous which measured 11 mm in February 2025, today measures 5 mm. Other peritoneal tracheal nodes are improving. Slight wall thickening along the trachea and central bronchi is improving. No new nodal enlargement identified. Lungs/Pleura: Advanced emphysematous lung changes are identified. Predominately centrilobular with some paraseptal changes. Large bulla formation identified along the lower lateral right hemithorax. There is scattered areas of mild ground-glass. And atelectatic change. Some ill-defined opacities in the right lower lobe are also noted. No pleural effusion or pneumothorax. Dominant lung masses are identified once again in the middle lobe. On the study of March 2025 this measured 6.5 x 6.4 cm. This had increased from the earlier study. Today this lesion on image 90 of series 3 measures 5.5 by 5.0 cm, smaller. The left lower lobe lesion which in March 2025 measured 5.5 x 3.8 cm, today measures 4.5 x 3.2 cm. There is a new small middle lobe nodule measuring 4 mm on series 3, image 73. Few punctate calcified nodules identified. Stable mild parenchymal opacity along the extreme inferior posterior right lower lobe. Musculoskeletal: Curvature and degenerative changes along the spine. Degenerative changes of the shoulders. There is cement seen along vertebral bodies at T10 and T6 with compression deformities. Appearance is similar to previous. CT ABDOMEN PELVIS FINDINGS Hepatobiliary: Diffuse fatty liver infiltration. No space-occupying liver lesion. Patent portal vein. Gallbladder is distended. Pancreas: Unremarkable. No pancreatic ductal dilatation or surrounding inflammatory changes. Spleen: Normal in size without focal abnormality. Adrenals/Urinary Tract: Bilateral adrenal nodules are again identified. Again felt to be adenomas on previous examinations. Areas of atrophy along both kidneys. Tiny cystic foci are also again  seen. No clear enhancing mass or collecting system dilatation. The ureters have normal course and caliber extending down to the urinary bladder. Preserved contour to the urinary bladder. Stomach/Bowel: Stomach is underdistended. There is appearance of some fold thickening but unchanged from previous. Please correlate with any symptoms. Small bowel is nondilated. Large bowel has a normal course and caliber with scattered stool. Few colonic diverticula. Normal appendix. Vascular/Lymphatic: Extensive vascular calcifications along the aorta and branch vessels. Areas of stenosis suggested along the distal abdominal aorta and the common femoral vessels in particular. Please correlate for any symptoms. On the prior there is an enlarged hypermetabolic lymph node near the porta hepatis anterior to the course of the hepatic artery. On  previous examination this measured 14 mm in short axis in February 2025 and today 12 mm on image 60 of series 2. No new areas of nodal enlargement. Reproductive: Prostate is unremarkable. Other: No free air or free fluid. Musculoskeletal: Scattered degenerative changes of the spine and pelvis. Curvature of the lumbar spine. Pars defects at L5. Trace listhesis. IMPRESSION: Overall slightly improved from previous examination. The large lung masses involving the middle lobe and left lower lobe appears slightly smaller today. Lymph nodes in the mediastinum, right hilum and upper abdomen are also slightly smaller. No new mass lesion, lymph node enlargement identified. Extensive vascular calcifications with some areas of stenosis particularly the common femoral arteries bilaterally. Please correlate with symptoms. Fatty liver infiltration. Advanced emphysematous lung changes. One new small right-sided lung 4 mm nodule identified. Recommend attention on follow-up. Electronically Signed   By: Adrianna Horde M.D.   On: 03/17/2024 19:11

## 2024-04-03 NOTE — Assessment & Plan Note (Signed)
 Alcohol cessation is discussed with patient.

## 2024-04-03 NOTE — Assessment & Plan Note (Signed)
 Check B12 and folate Hold chemotherapy.  Recommend alcohol cessation.

## 2024-04-03 NOTE — Assessment & Plan Note (Addendum)
 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  s/p dose reduced carboplatin , Taxol  and Keytruda  on 06/01/2023 x 4 cycles, poor PS/hospitalization--> Keytruda  maintenance until Feb 2025 --> PET progression--> 01/05/2024 concurrent chemo RT --> May 2025 CT showed slight decrease of lung mass--> June 2025 Gemcitabine .  Currently on Gemcitabine  D1 D8 Q21 days as systemic maintenance.  Labs are reviewed and discussed with patient. Hold off D8 Gemcitabine  due to thrombocytopenia

## 2024-04-03 NOTE — Assessment & Plan Note (Signed)
 Chronic respiratory failure on Oxygen .  Continue inhalers.

## 2024-04-03 NOTE — Assessment & Plan Note (Signed)
 Continue calcium  1200mg  and Vitamin D  supplementation.  Calcium  has improved.

## 2024-04-03 NOTE — Assessment & Plan Note (Signed)
 He drinks alcohol.  Recommend empiric B12 1000mcg supplementation.

## 2024-04-10 ENCOUNTER — Inpatient Hospital Stay

## 2024-04-10 ENCOUNTER — Inpatient Hospital Stay (HOSPITAL_BASED_OUTPATIENT_CLINIC_OR_DEPARTMENT_OTHER): Admitting: Oncology

## 2024-04-10 ENCOUNTER — Encounter: Payer: Self-pay | Admitting: Oncology

## 2024-04-10 VITALS — BP 133/79 | HR 79 | Resp 18 | Wt 182.2 lb

## 2024-04-10 VITALS — BP 136/81 | HR 79

## 2024-04-10 DIAGNOSIS — D696 Thrombocytopenia, unspecified: Secondary | ICD-10-CM | POA: Diagnosis not present

## 2024-04-10 DIAGNOSIS — J4489 Other specified chronic obstructive pulmonary disease: Secondary | ICD-10-CM

## 2024-04-10 DIAGNOSIS — D7589 Other specified diseases of blood and blood-forming organs: Secondary | ICD-10-CM

## 2024-04-10 DIAGNOSIS — Z5111 Encounter for antineoplastic chemotherapy: Secondary | ICD-10-CM

## 2024-04-10 DIAGNOSIS — C3491 Malignant neoplasm of unspecified part of right bronchus or lung: Secondary | ICD-10-CM

## 2024-04-10 DIAGNOSIS — F1090 Alcohol use, unspecified, uncomplicated: Secondary | ICD-10-CM

## 2024-04-10 LAB — CBC WITH DIFFERENTIAL (CANCER CENTER ONLY)
Abs Immature Granulocytes: 0.02 10*3/uL (ref 0.00–0.07)
Basophils Absolute: 0 10*3/uL (ref 0.0–0.1)
Basophils Relative: 0 %
Eosinophils Absolute: 0 10*3/uL (ref 0.0–0.5)
Eosinophils Relative: 1 %
HCT: 31.5 % — ABNORMAL LOW (ref 39.0–52.0)
Hemoglobin: 10.4 g/dL — ABNORMAL LOW (ref 13.0–17.0)
Immature Granulocytes: 1 %
Lymphocytes Relative: 15 %
Lymphs Abs: 0.6 10*3/uL — ABNORMAL LOW (ref 0.7–4.0)
MCH: 34.6 pg — ABNORMAL HIGH (ref 26.0–34.0)
MCHC: 33 g/dL (ref 30.0–36.0)
MCV: 104.7 fL — ABNORMAL HIGH (ref 80.0–100.0)
Monocytes Absolute: 0.7 10*3/uL (ref 0.1–1.0)
Monocytes Relative: 19 %
Neutro Abs: 2.5 10*3/uL (ref 1.7–7.7)
Neutrophils Relative %: 64 %
Platelet Count: 293 10*3/uL (ref 150–400)
RBC: 3.01 MIL/uL — ABNORMAL LOW (ref 4.22–5.81)
RDW: 17.2 % — ABNORMAL HIGH (ref 11.5–15.5)
WBC Count: 3.8 10*3/uL — ABNORMAL LOW (ref 4.0–10.5)
nRBC: 0 % (ref 0.0–0.2)

## 2024-04-10 LAB — CMP (CANCER CENTER ONLY)
ALT: 25 U/L (ref 0–44)
AST: 25 U/L (ref 15–41)
Albumin: 4.3 g/dL (ref 3.5–5.0)
Alkaline Phosphatase: 75 U/L (ref 38–126)
Anion gap: 10 (ref 5–15)
BUN: 18 mg/dL (ref 8–23)
CO2: 25 mmol/L (ref 22–32)
Calcium: 8.8 mg/dL — ABNORMAL LOW (ref 8.9–10.3)
Chloride: 96 mmol/L — ABNORMAL LOW (ref 98–111)
Creatinine: 0.71 mg/dL (ref 0.61–1.24)
GFR, Estimated: >60 mL/min (ref >60)
Glucose, Bld: 119 mg/dL — ABNORMAL HIGH (ref 70–99)
Potassium: 3.8 mmol/L (ref 3.5–5.1)
Sodium: 131 mmol/L — ABNORMAL LOW (ref 135–145)
Total Bilirubin: 0.8 mg/dL (ref 0.0–1.2)
Total Protein: 7 g/dL (ref 6.5–8.1)

## 2024-04-10 LAB — VITAMIN B12: Vitamin B-12: 249 pg/mL (ref 180–914)

## 2024-04-10 MED ORDER — SODIUM CHLORIDE 0.9 % IV SOLN
1000.0000 mg/m2 | Freq: Once | INTRAVENOUS | Status: AC
  Administered 2024-04-10: 1976 mg via INTRAVENOUS
  Filled 2024-04-10: qty 51.97

## 2024-04-10 MED ORDER — SODIUM CHLORIDE 0.9 % IV SOLN
INTRAVENOUS | Status: DC
  Filled 2024-04-10: qty 250

## 2024-04-10 MED ORDER — PROCHLORPERAZINE MALEATE 10 MG PO TABS
10.0000 mg | ORAL_TABLET | Freq: Once | ORAL | Status: AC
  Administered 2024-04-10: 10 mg via ORAL
  Filled 2024-04-10: qty 1

## 2024-04-10 MED ORDER — HEPARIN SOD (PORK) LOCK FLUSH 100 UNIT/ML IV SOLN
500.0000 [IU] | Freq: Once | INTRAVENOUS | Status: AC | PRN
  Administered 2024-04-10: 500 [IU]
  Filled 2024-04-10: qty 5

## 2024-04-10 NOTE — Assessment & Plan Note (Signed)
 He drinks alcohol.  Recommend empiric B12 1000mcg supplementation.

## 2024-04-10 NOTE — Assessment & Plan Note (Signed)
 Chemotherapy plan as listed above

## 2024-04-10 NOTE — Assessment & Plan Note (Signed)
 Continue calcium  1200mg  and Vitamin D  supplementation.  Calcium  has improved.

## 2024-04-10 NOTE — Assessment & Plan Note (Signed)
 Chronic respiratory failure on Oxygen .  Continue inhalers.

## 2024-04-10 NOTE — Assessment & Plan Note (Addendum)
 Alcohol cessation is discussed with patient. Encourage cessation effort

## 2024-04-10 NOTE — Assessment & Plan Note (Addendum)
 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  s/p dose reduced carboplatin , Taxol  and Keytruda  on 06/01/2023 x 4 cycles, poor PS/hospitalization--> Keytruda  maintenance until Feb 2025 --> PET progression--> 01/05/2024 concurrent chemo RT --> May 2025 CT showed slight decrease of lung mass--> June 2025 Gemcitabine .  Currently on Gemcitabine  D1 D8 Q21 days as systemic maintenance.  Labs are reviewed and discussed with patient. Proceed with D15 Gemcitabine  due to thrombocytopenia Will change his chemo schedule to D1, D15 Gemcitabine 

## 2024-04-10 NOTE — Patient Instructions (Signed)
 [facility] - A DEPT OF [facility]  Discharge Instructions: Thank you for choosing [facility] to provide your oncology and hematology care.  If you have a lab appointment with the Cancer Center, please go directly to the Cancer Center and check in at the registration area.  Wear comfortable clothing and clothing appropriate for easy access to any Portacath or PICC line.   We strive to give you quality time with your provider. You may need to reschedule your appointment if you arrive late (15 or more minutes).  Arriving late affects you and other patients whose appointments are after yours.  Also, if you miss three or more appointments without notifying the office, you may be dismissed from the clinic at the provider's discretion.      For prescription refill requests, have your pharmacy contact our office and allow 72 hours for refills to be completed.    Today you received the following chemotherapy and/or immunotherapy agents Gemzar      To help prevent nausea and vomiting after your treatment, we encourage you to take your nausea medication as directed.  BELOW ARE SYMPTOMS THAT SHOULD BE REPORTED IMMEDIATELY: *FEVER GREATER THAN 100.4 F (38 C) OR HIGHER *CHILLS OR SWEATING *NAUSEA AND VOMITING THAT IS NOT CONTROLLED WITH YOUR NAUSEA MEDICATION *UNUSUAL SHORTNESS OF BREATH *UNUSUAL BRUISING OR BLEEDING *URINARY PROBLEMS (pain or burning when urinating, or frequent urination) *BOWEL PROBLEMS (unusual diarrhea, constipation, pain near the anus) TENDERNESS IN MOUTH AND THROAT WITH OR WITHOUT PRESENCE OF ULCERS (sore throat, sores in mouth, or a toothache) UNUSUAL RASH, SWELLING OR PAIN  UNUSUAL VAGINAL DISCHARGE OR ITCHING   Items with * indicate a potential emergency and should be followed up as soon as possible or go to the Emergency Department if any problems should occur.  Please show the CHEMOTHERAPY ALERT CARD or IMMUNOTHERAPY ALERT  CARD at check-in to the Emergency Department and triage nurse.  Should you have questions after your visit or need to cancel or reschedule your appointment, please contact [facility] - A DEPT OF [facility]  [phone] and follow the prompts.  Office hours are 8:00 a.m. to 4:30 p.m. Monday - Friday. Please note that voicemails left after 4:00 p.m. may not be returned until the following business day.  We are closed weekends and major holidays. You have access to a nurse at all times for urgent questions. Please call the main number to the clinic [phone] and follow the prompts.  For any non-urgent questions, you may also contact your provider using MyChart. We now offer e-Visits for anyone 60 and older to request care online for non-urgent symptoms. For details visit [email or web address].   Also download the MyChart app! Go to the app store, search "MyChart", open the app, select [place], and log in with your MyChart username and password.

## 2024-04-10 NOTE — Progress Notes (Signed)
 Hematology/Oncology Progress note [phone] [phone]        CHIEF COMPLAINTS/PURPOSE OF CONSULTATION:  Stage IV lung adenocarcinoma.   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  s/p dose reduced carboplatin , Taxol  and Keytruda  on 06/01/2023 x 4 cycles, poor PS/hospitalization--> Keytruda  maintenance until Feb 2025 --> PET progression--> 01/05/2024 concurrent chemo RT --> May 2025 CT showed slight decrease of lung mass--> June 2025 Gemcitabine .  Currently on Gemcitabine  D1 D8 Q21 days as systemic maintenance.  Labs are reviewed and discussed with patient. Proceed with D15 Gemcitabine  due to thrombocytopenia Will change his chemo schedule to D1, D15 Gemcitabine     Thrombocytopenia (HCC) Resolved.  Recommend alcohol cessation.   Macrocytosis He drinks alcohol.  Recommend empiric B12 1000mcg supplementation.   Hypocalcemia Continue calcium  1200mg  and Vitamin D  supplementation.  Calcium  has improved.   Encounter for antineoplastic chemotherapy Chemotherapy plan as listed above.   Asthma-COPD overlap syndrome (HCC) Chronic respiratory failure on Oxygen .  Continue inhalers.    Alcohol use Alcohol cessation is discussed with patient. Encourage cessation effort  No orders of the defined types were placed in this encounter.  Follow up in 2 weeks  All questions were answered. The patient knows to call the clinic with any problems, questions or concerns.  Zelphia Cap, MD, PhD [facility] Hematology Oncology 04/10/2024    HISTORY OF PRESENTING ILLNESS:  Austin Patterson 68 y.o. male presents to establish care for Stage IV lung adenocarcinoma.  Patient previously followed up with Dr. Clista who left  practice. He establish care with on 03/02/2024  I have reviewed his chart and materials related to his cancer extensively and collaborated history with the patient. Summary of oncologic history is as follows: Oncology History  Stage IV adenocarcinoma of lung (HCC)  04/19/2023 Imaging   PET scan showed 1. 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 2. Right lower lobe hypermetabolic pulmonary nodule, favoring synchronous primary over pulmonary metastasis. 3. Incidental findings, including: Aortic atherosclerosis (ICD10-I70.0), coronary artery atherosclerosis and emphysema (ICD10-J43.9). Prostatomegaly. Sinus disease.   05/18/2023 Initial Diagnosis   Sarcomatoid carcinoma of lung (HCC)   05/20/2023 Cancer Staging   Staging form: Lung, AJCC 8th Edition - Clinical: Stage IV (cT4, cN3, cM1) - Signed by Agrawal, Kavita, MD on 05/20/2023 Stage prefix: Initial diagnosis   06/01/2023 - 11/08/2023 Chemotherapy   Patient is on Treatment Plan : LUNG NSCLC Carboplatin  (6) + Paclitaxel  (200) + Pembrolizumab  (200) D1 q21d x 4 cycles / Pembrolizumab  (200) Maintenance D1 q21d     08/30/2023 Imaging   CT chest with contrast improving right middle lobe mass, corresponding to the patient's known primary bronchogenic carcinoma. Small mediastinal and right perihilar nodal metastases are also improving.   Associated bilateral lower lobe pulmonary metastases, overall improved, although one right lower lobe nodular opacity is mildly progressive. Attention on follow-up is suggested.   Stable bilateral adrenal adenomas.   Aortic Atherosclerosis (ICD10-I70.0) and Emphysema (ICD10-J43.9).   11/26/2023 Imaging   CT chest abdomen pelvis with contrast 1. Interval progression of disease. The dominant mass within the right middle lobe has increased in size in the interval. There is also been significant  increase in size of left lower lobe metastasis.  Stable to mild increase in size of metastasis to the posterior right lower lobe. 2. Stable appearance of mediastinal and right hilar lymph nodes. 3. No signs of metastatic disease within the abdomen or pelvis. 4. Aortic Atherosclerosis (ICD10-I70.0) and Emphysema (ICD10-J43.9).   12/14/2023 Imaging   PET scan showed 1. Disease progression as evidenced by hypermetabolic nodules and masses in the lungs bilaterally as well as hypermetabolic metastatic mediastinal, hilar and periportal lymph nodes. 2. Bilateral adrenal adenomas. 3. Gastric wall thickening. 4. Aortic atherosclerosis (ICD10-I70.0). Coronary artery calcification. 5.  Emphysema (ICD10-J43.9)   01/06/2024 - 03/02/2024 Chemotherapy   Patient is on Treatment Plan : LUNG Carboplatin  + Paclitaxel  + XRT q7d     03/17/2024 Imaging   CT chest abdomen pelvis w contrast showed Overall slightly improved from previous examination. The large lung masses involving the middle lobe and left lower lobe appears slightly smaller today. Lymph nodes in the mediastinum, right hilum and upper abdomen are also slightly smaller. No new mass lesion, lymph node enlargement identified. Extensive vascular calcifications with some areas of stenosis particularly the common femoral arteries bilaterally. Please correlate with symptoms. Fatty liver infiltration.   Advanced emphysematous lung changes.   One new small right-sided lung 4 mm nodule identified. Recommend attention on follow-up.     03/23/2024 -  Chemotherapy   Patient is on Treatment Plan : LUNG Gemcitabine  (1000) D1,8 q21d      He reports feeling ok today.  + increased SOB to his baseline level. Chronic respiratory failure No nausea vomiting diarrhea.  He cuts back on alcohol use.   MEDICAL HISTORY:  Past Medical History:  Diagnosis Date   Abdominal pain 12/07/2022   Acute urinary retention 12/07/2022   Chest pain 01/24/2022   COPD  (chronic obstructive pulmonary disease) (HCC)    GERD (gastroesophageal reflux disease)    Hemothorax on right 05/11/2023   Hyperlipidemia    Hypertension    Pleuritic pain 05/10/2023   Pneumothorax after biopsy 05/11/2023   Post procedure discomfort 05/13/2023   Pulmonary fibrosis (HCC) 11/2015    SURGICAL HISTORY: Past Surgical History:  Procedure Laterality Date   COLONOSCOPY     CORONARY STENT PLACEMENT     ESOPHAGOGASTRODUODENOSCOPY (EGD) WITH PROPOFOL  N/A 09/23/2016   Procedure: ESOPHAGOGASTRODUODENOSCOPY (EGD) WITH PROPOFOL ;  Surgeon: Ruel Kung, MD;  Location: [facility];  Service: Endoscopy;  Laterality: N/A;   IR IMAGING GUIDED PORT INSERTION  05/26/2023   IR IMAGING GUIDED PORT INSERTION  07/30/2023   IR RADIOLOGIST EVAL & MGMT  07/20/2023   KYPHOPLASTY N/A 03/14/2020   Procedure: T7 & T11 KYPHOPLASTY;  Surgeon: Kathlynn Sharper, MD;  Location: [facility] ORS;  Service: Orthopedics;  Laterality: N/A;   PORT-A-CATH REMOVAL N/A 06/14/2023   Procedure: REMOVAL PORT-A-CATH;  Surgeon: Tye Millet, DO;  Location: [facility] ORS;  Service: General;  Laterality: N/A;   SHOULDER ACROMIOPLASTY      SOCIAL HISTORY: Social History   Socioeconomic History   Marital status: Divorced    Spouse name: Not on file   Number of children: Not on file   Years of education: Not on file   Highest education level: Not on file  Occupational History   Not on file  Tobacco Use   Smoking status: Former    Current packs/day: 0.00    Average packs/day: 2.0 packs/day for 50.0 years (100.0 ttl pk-yrs)    Types: Cigarettes    Start date: 10/13/1965    Quit date: 10/14/2015    Years since quitting: 8.4  Smokeless tobacco: Former  Building services engineer status: Never Used  Substance and Sexual Activity   Alcohol use: Yes    Alcohol/week: 56.0 standard drinks of alcohol    Types: 56 Cans of beer per week    Comment: I sit around and drink beer, that's all I got to do   Drug use: No   Sexual activity:  Not Currently  Other Topics Concern   Not on file  Social History Narrative   Not on file   Social Drivers of Health   Financial Resource Strain: Not on file  Food Insecurity: No Food Insecurity (12/29/2023)   Hunger Vital Sign    Worried About Running Out of Food in the Last Year: Never true    Ran Out of Food in the Last Year: Never true  Transportation Needs: Unmet Transportation Needs (01/27/2024)   PRAPARE - Administrator, Civil Service (Medical): Yes    Lack of Transportation (Non-Medical): Yes  Physical Activity: Not on file  Stress: Not on file  Social Connections: Socially Isolated (12/29/2023)   Social Connection and Isolation Panel    Frequency of Communication with Friends and Family: Three times a week    Frequency of Social Gatherings with Friends and Family: Once a week    Attends Religious Services: Never    Database administrator or Organizations: No    Attends Banker Meetings: Never    Marital Status: Divorced  Catering manager Violence: Not At Risk (12/29/2023)   Humiliation, Afraid, Rape, and Kick questionnaire    Fear of Current or Ex-Partner: No    Emotionally Abused: No    Physically Abused: No    Sexually Abused: No    FAMILY HISTORY: Family History  Problem Relation Age of Onset   Heart disease Mother     ALLERGIES:  is allergic to amoxicillin  and tizanidine .  MEDICATIONS:  Current Outpatient Medications  Medication Sig Dispense Refill   acetaminophen  (TYLENOL ) 500 MG tablet Take 1,000 mg by mouth every 6 (six) hours as needed for moderate pain or headache.     albuterol  (VENTOLIN  HFA) 108 (90 Base) MCG/ACT inhaler Inhale 2 puffs into the lungs every 6 (six) hours as needed for wheezing or shortness of breath. 8 g 11   amLODipine  (NORVASC ) 5 MG tablet Take 1 tablet (5 mg total) by mouth at bedtime. Skip the dose if systolic BP less than 130 mmHg 30 tablet 5   aspirin  EC 81 MG tablet Take 1 tablet (81 mg total) by mouth  daily. 90 tablet 3   atorvastatin  (LIPITOR) 40 MG tablet Take 1 tablet (40 mg total) by mouth at bedtime. 90 tablet 3   CALCIUM  600/VITAMIN D  600-10 MG-MCG TABS Take 1 tablet by mouth 2 (two) times daily.     clopidogrel  (PLAVIX ) 75 MG tablet Take 1 tablet (75 mg total) by mouth daily. 90 tablet 3   Dupilumab  (DUPIXENT ) 300 MG/2ML SOPN Inject 300 mg into the skin every 14 (fourteen) days. 12 mL 1   EPINEPHrine  0.3 mg/0.3 mL IJ SOAJ injection Inject 0.3 mg into the muscle as needed for anaphylaxis.     gabapentin  (NEURONTIN ) 300 MG capsule TAKE 1 CAPSULE BY MOUTH THREE TIMES A DAY 90 capsule 1   guaiFENesin -dextromethorphan  (ROBITUSSIN DM) 100-10 MG/5ML syrup Take 10 mLs by mouth every 6 (six) hours as needed for cough. 118 mL 0   ipratropium-albuterol  (DUONEB) 0.5-2.5 (3) MG/3ML SOLN INHALE 1 VIAL THROUGH NEBULIZER EVERY 6  HOURS (Patient taking differently: Take 3 mLs by nebulization every 6 (six) hours as needed.) 270 mL 2   lidocaine -prilocaine  (EMLA ) cream Apply to affected area once 30 g 5   magnesium  oxide (MAG-OX) 400 (241.3 Mg) MG tablet Take 1 tablet (400 mg total) by mouth daily. 30 tablet 0   metoprolol  succinate (TOPROL -XL) 50 MG 24 hr tablet Take 50 mg by mouth daily.     montelukast  (SINGULAIR ) 10 MG tablet Take 10 mg by mouth daily.     Multiple Vitamin (MULTIVITAMIN WITH MINERALS) TABS tablet Take 1 tablet by mouth daily. 30 tablet 0   omega-3 acid ethyl esters (LOVAZA) 1 g capsule Take 1 g by mouth daily.     oxyCODONE  (OXY IR/ROXICODONE ) 5 MG immediate release tablet Take 1 tablet (5 mg total) by mouth every 8 (eight) hours as needed for severe pain (pain score 7-10). 90 tablet 0   pantoprazole  (PROTONIX ) 40 MG tablet Take 1 tablet (40 mg total) by mouth 2 (two) times daily. 90 tablet 3   potassium chloride  SA (KLOR-CON  M) 20 MEQ tablet Take 20 mEq by mouth daily.     predniSONE  (DELTASONE ) 10 MG tablet TAKE 1 TABLET (10 MG TOTAL) BY MOUTH DAILY AS NEEDED. TAKE AS DIRECTED. 30  tablet 1   Spacer/Aero-Holding Chambers (AEROCHAMBER MV) inhaler Use as instructed 1 each 0   sucralfate  (CARAFATE ) 1 g tablet Take 1 tablet (1 g total) by mouth 3 (three) times daily before meals. Dissolve in 4tbs warm water , swish and swallow 90 tablet 4   ferrous sulfate  325 (65 FE) MG tablet Take 1 tablet (325 mg total) by mouth 2 (two) times daily with a meal. (Patient not taking: Reported on 04/10/2024) 60 tablet 2   No current facility-administered medications for this visit.   Facility-Administered Medications Ordered in Other Visits  Medication Dose Route Frequency Provider Last Rate Last Admin   0.9 %  sodium chloride  infusion   Intravenous Continuous Babara Call, MD   Stopped at 04/10/24 1148   heparin  lock flush 100 unit/mL  500 Units Intravenous Once Evaleigh Mccamy, MD        Review of Systems  Constitutional:  Positive for fatigue. Negative for appetite change, chills and fever.  HENT:   Negative for hearing loss and voice change.   Eyes:  Negative for eye problems and icterus.  Respiratory:  Positive for shortness of breath. Negative for chest tightness and cough.   Cardiovascular:  Negative for chest pain and leg swelling.  Gastrointestinal:  Negative for abdominal distention and abdominal pain.  Endocrine: Negative for hot flashes.  Genitourinary:  Negative for difficulty urinating, dysuria and frequency.   Musculoskeletal:  Negative for arthralgias.  Skin:  Negative for itching and rash.  Neurological:  Negative for light-headedness and numbness.  Hematological:  Negative for adenopathy. Does not bruise/bleed easily.  Psychiatric/Behavioral:  Negative for confusion.      PHYSICAL EXAMINATION: ECOG PERFORMANCE STATUS: 2 - Symptomatic, <50% confined to bed  Vitals:   04/10/24 1021  BP: 133/79  Pulse: 79  Resp: 18  SpO2: 100%   Filed Weights   04/10/24 1021  Weight: 182 lb 3.2 oz (82.6 kg)    Physical Exam Constitutional:      General: He is not in acute  distress.    Appearance: He is not diaphoretic.  HENT:     Head: Normocephalic and atraumatic.     Mouth/Throat:     Pharynx: No oropharyngeal exudate.   Eyes:  General: No scleral icterus.    Pupils: Pupils are equal, round, and reactive to light.    Cardiovascular:     Rate and Rhythm: Normal rate and regular rhythm.  Pulmonary:     Effort: Pulmonary effort is normal. No respiratory distress.     Comments: Decreased breath sounds bilaterally. On nasal cannula oxygen  Abdominal:     General: Bowel sounds are normal. There is no distension.     Palpations: Abdomen is soft.   Musculoskeletal:        General: Normal range of motion.     Cervical back: Normal range of motion and neck supple.   Skin:    General: Skin is warm and dry.     Findings: No erythema.   Neurological:     Mental Status: He is alert and oriented to person, place, and time. Mental status is at baseline.     Motor: No abnormal muscle tone.   Psychiatric:        Mood and Affect: Affect normal.      LABORATORY DATA:  I have reviewed the data as listed    Latest Ref Rng & Units 04/10/2024   10:14 AM 04/03/2024    8:57 AM 03/23/2024    8:14 AM  CBC  WBC 4.0 - 10.5 K/uL 3.8  3.5  5.2   Hemoglobin 13.0 - 17.0 g/dL 89.5  9.4  89.1   Hematocrit 39.0 - 52.0 % 31.5  27.9  32.3   Platelets 150 - 400 K/uL 293  79  114       Latest Ref Rng & Units 04/10/2024   10:14 AM 04/03/2024    8:57 AM 03/23/2024    8:14 AM  CMP  Glucose 70 - 99 mg/dL 880  880  898   BUN 8 - 23 mg/dL 18  15  16    Creatinine 0.61 - 1.24 mg/dL 9.28  9.24  9.26   Sodium 135 - 145 mmol/L 131  131  134   Potassium 3.5 - 5.1 mmol/L 3.8  4.1  4.2   Chloride 98 - 111 mmol/L 96  95  100   CO2 22 - 32 mmol/L 25  25  25    Calcium  8.9 - 10.3 mg/dL 8.8  9.1  8.3   Total Protein 6.5 - 8.1 g/dL 7.0  6.8  6.6   Total Bilirubin 0.0 - 1.2 mg/dL 0.8  0.8  0.9   Alkaline Phos 38 - 126 U/L 75  71  55   AST 15 - 41 U/L 25  36  20   ALT 0 - 44 U/L  25  49  16      RADIOGRAPHIC STUDIES: I have personally reviewed the radiological images as listed and agreed with the findings in the report. CT CHEST ABDOMEN PELVIS W CONTRAST Result Date: 03/17/2024 CLINICAL DATA:  Restaging lung cancer. Chemotherapy. Shortness of breath on exertion with cough. * Tracking Code: BO * EXAM: CT CHEST, ABDOMEN, AND PELVIS WITH CONTRAST TECHNIQUE: Multidetector CT imaging of the chest, abdomen and pelvis was performed following the standard protocol during bolus administration of intravenous contrast. RADIATION DOSE REDUCTION: This exam was performed according to the departmental dose-optimization program which includes automated exposure control, adjustment of the mA and/or kV according to patient size and/or use of iterative reconstruction technique. CONTRAST:  85mL OMNIPAQUE  IOHEXOL  300 MG/ML  SOLN COMPARISON:  CTA chest 12/29/2023 and chest x-ray. PET-CT scan 12/14/2023. CT chest abdomen pelvis 11/26/2023. Numerous older exams as well.  FINDINGS: CT CHEST FINDINGS Cardiovascular: Heart is nonenlarged. No pericardial effusion. Coronary artery calcifications are seen. The thoracic aorta has a normal course and caliber with scattered calcified atherosclerotic plaque. There is also calcified plaque along the great vessels. Left IJ chest port in place with tip of the catheter extending to the central SVC above the right atrium. Mediastinum/Nodes: Slightly patulous thoracic esophagus. Preserved visualized portions of the thyroid  gland. No abnormal lymph node enlargement identified in the axillary region, hilum or mediastinum. On the previous examination there were several prominent mediastinal nodes. These have improved. Precarinal node which previously measured 14 mm in short axis on the prior, today measures 8 mm. On the study of February 2025 this measured 13 mm in short axis. Subcarinal node near the right hilum on the previous which measured 11 mm in February 2025, today  measures 5 mm. Other peritoneal tracheal nodes are improving. Slight wall thickening along the trachea and central bronchi is improving. No new nodal enlargement identified. Lungs/Pleura: Advanced emphysematous lung changes are identified. Predominately centrilobular with some paraseptal changes. Large bulla formation identified along the lower lateral right hemithorax. There is scattered areas of mild ground-glass. And atelectatic change. Some ill-defined opacities in the right lower lobe are also noted. No pleural effusion or pneumothorax. Dominant lung masses are identified once again in the middle lobe. On the study of March 2025 this measured 6.5 x 6.4 cm. This had increased from the earlier study. Today this lesion on image 90 of series 3 measures 5.5 by 5.0 cm, smaller. The left lower lobe lesion which in March 2025 measured 5.5 x 3.8 cm, today measures 4.5 x 3.2 cm. There is a new small middle lobe nodule measuring 4 mm on series 3, image 73. Few punctate calcified nodules identified. Stable mild parenchymal opacity along the extreme inferior posterior right lower lobe. Musculoskeletal: Curvature and degenerative changes along the spine. Degenerative changes of the shoulders. There is cement seen along vertebral bodies at T10 and T6 with compression deformities. Appearance is similar to previous. CT ABDOMEN PELVIS FINDINGS Hepatobiliary: Diffuse fatty liver infiltration. No space-occupying liver lesion. Patent portal vein. Gallbladder is distended. Pancreas: Unremarkable. No pancreatic ductal dilatation or surrounding inflammatory changes. Spleen: Normal in size without focal abnormality. Adrenals/Urinary Tract: Bilateral adrenal nodules are again identified. Again felt to be adenomas on previous examinations. Areas of atrophy along both kidneys. Tiny cystic foci are also again seen. No clear enhancing mass or collecting system dilatation. The ureters have normal course and caliber extending down to the  urinary bladder. Preserved contour to the urinary bladder. Stomach/Bowel: Stomach is underdistended. There is appearance of some fold thickening but unchanged from previous. Please correlate with any symptoms. Small bowel is nondilated. Large bowel has a normal course and caliber with scattered stool. Few colonic diverticula. Normal appendix. Vascular/Lymphatic: Extensive vascular calcifications along the aorta and branch vessels. Areas of stenosis suggested along the distal abdominal aorta and the common femoral vessels in particular. Please correlate for any symptoms. On the prior there is an enlarged hypermetabolic lymph node near the porta hepatis anterior to the course of the hepatic artery. On previous examination this measured 14 mm in short axis in February 2025 and today 12 mm on image 60 of series 2. No new areas of nodal enlargement. Reproductive: Prostate is unremarkable. Other: No free air or free fluid. Musculoskeletal: Scattered degenerative changes of the spine and pelvis. Curvature of the lumbar spine. Pars defects at L5. Trace listhesis. IMPRESSION: Overall slightly improved from  previous examination. The large lung masses involving the middle lobe and left lower lobe appears slightly smaller today. Lymph nodes in the mediastinum, right hilum and upper abdomen are also slightly smaller. No new mass lesion, lymph node enlargement identified. Extensive vascular calcifications with some areas of stenosis particularly the common femoral arteries bilaterally. Please correlate with symptoms. Fatty liver infiltration. Advanced emphysematous lung changes. One new small right-sided lung 4 mm nodule identified. Recommend attention on follow-up. Electronically Signed   By: Ranell Bring M.D.   On: 03/17/2024 19:11

## 2024-04-10 NOTE — Assessment & Plan Note (Addendum)
 Resolved.  Recommend alcohol cessation.

## 2024-04-16 ENCOUNTER — Other Ambulatory Visit: Payer: Self-pay | Admitting: Pulmonary Disease

## 2024-04-16 DIAGNOSIS — J9611 Chronic respiratory failure with hypoxia: Secondary | ICD-10-CM

## 2024-04-16 DIAGNOSIS — J449 Chronic obstructive pulmonary disease, unspecified: Secondary | ICD-10-CM

## 2024-04-16 DIAGNOSIS — C3491 Malignant neoplasm of unspecified part of right bronchus or lung: Secondary | ICD-10-CM

## 2024-04-16 DIAGNOSIS — J4550 Severe persistent asthma, uncomplicated: Secondary | ICD-10-CM

## 2024-04-16 DIAGNOSIS — E8801 Alpha-1-antitrypsin deficiency: Secondary | ICD-10-CM

## 2024-04-17 ENCOUNTER — Ambulatory Visit

## 2024-04-17 ENCOUNTER — Other Ambulatory Visit: Payer: Self-pay | Admitting: Pulmonary Disease

## 2024-04-17 ENCOUNTER — Encounter

## 2024-04-17 ENCOUNTER — Ambulatory Visit: Admitting: Oncology

## 2024-04-17 ENCOUNTER — Ambulatory Visit
Admission: RE | Admit: 2024-04-17 | Discharge: 2024-04-17 | Disposition: A | Discharge status: Home / Self Care | Source: Ambulatory Visit | Attending: Radiation Oncology | Admitting: Radiation Oncology

## 2024-04-17 ENCOUNTER — Other Ambulatory Visit: Payer: Self-pay | Admitting: *Deleted

## 2024-04-17 ENCOUNTER — Other Ambulatory Visit

## 2024-04-17 ENCOUNTER — Inpatient Hospital Stay

## 2024-04-17 ENCOUNTER — Encounter: Payer: Self-pay | Admitting: Radiation Oncology

## 2024-04-17 VITALS — BP 129/79 | HR 92 | Temp 98.7°F | Resp 17 | Wt 188.2 lb

## 2024-04-17 DIAGNOSIS — D381 Neoplasm of uncertain behavior of trachea, bronchus and lung: Secondary | ICD-10-CM

## 2024-04-17 NOTE — Progress Notes (Signed)
 Radiation Oncology Follow up Note  Name: Austin Patterson   Date:   04/17/2024 MRN:  [id number] DOB: 08/12/1956    This 68 y.o. 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  REFERRING PROVIDER: Autry Grayce LABOR, PA  HPI: Patient is a 68 year old male now out 1 month having completed radiation therapy to his chest for stage IV adenocarcinoma with spindle cell features presenting with hemoptysis.  He is seen today in routine follow-up and is doing well.  His hemoptysis is completely resolved.  He is using nasal oxygen  around-the-clock.  He specifically [place] cough chest tightness or any change in his pulmonary status..  COMPLICATIONS OF TREATMENT: none  FOLLOW UP COMPLIANCE: keeps appointments   PHYSICAL EXAM:  BP 129/79   Pulse 92   Temp 98.7 F (37.1 C)   Resp 17   Wt 188 lb 3.2 oz (85.4 kg)   SpO2 95%   BMI 29.48 kg/m  Frail-appearing wheelchair-bound male on nasal oxygen  in NAD.  Well-developed well-nourished patient in NAD. HEENT reveals PERLA, EOMI, discs not visualized.  Oral cavity is clear. No oral mucosal lesions are identified. Neck is clear without evidence of cervical or supraclavicular adenopathy. Lungs are clear to A&P. Cardiac examination is essentially unremarkable with regular rate and rhythm without murmur rub or thrill. Abdomen is benign with no organomegaly or masses noted. Motor sensory and DTR levels are equal and symmetric in the upper and lower extremities. Cranial nerves II through XII are grossly intact. Proprioception is intact. No peripheral adenopathy or edema is identified. No motor or sensory levels are noted. Crude visual fields are within normal range.  RADIOLOGY RESULTS: CT scan ordered in 3 months  PLAN: Present time patient is doing well stable hemoptysis is completely  resolved.  Pleased with his overall status.  I asked to see him back in 3 months with a CT scan of his chest prior to that visit.  Patient comprehends my recommendations well.  I would like to take this opportunity to thank you for allowing me to participate in the care of your patient.[name], MD

## 2024-04-18 ENCOUNTER — Encounter: Payer: Self-pay | Admitting: Internal Medicine

## 2024-04-18 ENCOUNTER — Encounter: Payer: Self-pay | Admitting: Oncology

## 2024-04-20 ENCOUNTER — Ambulatory Visit: Admitting: Pulmonary Disease

## 2024-04-24 ENCOUNTER — Encounter: Payer: Self-pay | Admitting: Oncology

## 2024-04-24 ENCOUNTER — Inpatient Hospital Stay

## 2024-04-24 ENCOUNTER — Inpatient Hospital Stay: Attending: Oncology

## 2024-04-24 ENCOUNTER — Inpatient Hospital Stay (HOSPITAL_BASED_OUTPATIENT_CLINIC_OR_DEPARTMENT_OTHER): Admitting: Oncology

## 2024-04-24 ENCOUNTER — Encounter

## 2024-04-24 VITALS — BP 126/68 | HR 88

## 2024-04-24 VITALS — BP 137/81 | HR 85 | Temp 95.7°F | Resp 18 | Ht 67.0 in | Wt 189.9 lb

## 2024-04-24 DIAGNOSIS — Z5111 Encounter for antineoplastic chemotherapy: Secondary | ICD-10-CM

## 2024-04-24 DIAGNOSIS — C3491 Malignant neoplasm of unspecified part of right bronchus or lung: Secondary | ICD-10-CM

## 2024-04-24 DIAGNOSIS — J4489 Other specified chronic obstructive pulmonary disease: Secondary | ICD-10-CM

## 2024-04-24 DIAGNOSIS — D696 Thrombocytopenia, unspecified: Secondary | ICD-10-CM | POA: Insufficient documentation

## 2024-04-24 DIAGNOSIS — Z79899 Other long term (current) drug therapy: Secondary | ICD-10-CM | POA: Diagnosis not present

## 2024-04-24 DIAGNOSIS — F1090 Alcohol use, unspecified, uncomplicated: Secondary | ICD-10-CM

## 2024-04-24 DIAGNOSIS — D7589 Other specified diseases of blood and blood-forming organs: Secondary | ICD-10-CM

## 2024-04-24 DIAGNOSIS — Z7982 Long term (current) use of aspirin: Secondary | ICD-10-CM | POA: Diagnosis not present

## 2024-04-24 DIAGNOSIS — Z9981 Dependence on supplemental oxygen: Secondary | ICD-10-CM | POA: Diagnosis not present

## 2024-04-24 DIAGNOSIS — Z87891 Personal history of nicotine dependence: Secondary | ICD-10-CM | POA: Diagnosis not present

## 2024-04-24 LAB — CBC WITH DIFFERENTIAL (CANCER CENTER ONLY)
Abs Immature Granulocytes: 0.03 K/uL (ref 0.00–0.07)
Basophils Absolute: 0 K/uL (ref 0.0–0.1)
Basophils Relative: 0 %
Eosinophils Absolute: 0.1 K/uL (ref 0.0–0.5)
Eosinophils Relative: 1 %
HCT: 30.8 % — ABNORMAL LOW (ref 39.0–52.0)
Hemoglobin: 10 g/dL — ABNORMAL LOW (ref 13.0–17.0)
Immature Granulocytes: 1 %
Lymphocytes Relative: 10 %
Lymphs Abs: 0.6 K/uL — ABNORMAL LOW (ref 0.7–4.0)
MCH: 34.4 pg — ABNORMAL HIGH (ref 26.0–34.0)
MCHC: 32.5 g/dL (ref 30.0–36.0)
MCV: 105.8 fL — ABNORMAL HIGH (ref 80.0–100.0)
Monocytes Absolute: 0.7 K/uL (ref 0.1–1.0)
Monocytes Relative: 10 %
Neutro Abs: 5 K/uL (ref 1.7–7.7)
Neutrophils Relative %: 78 %
Platelet Count: 211 K/uL (ref 150–400)
RBC: 2.91 MIL/uL — ABNORMAL LOW (ref 4.22–5.81)
RDW: 17.4 % — ABNORMAL HIGH (ref 11.5–15.5)
WBC Count: 6.4 K/uL (ref 4.0–10.5)
nRBC: 0 % (ref 0.0–0.2)

## 2024-04-24 LAB — CMP (CANCER CENTER ONLY)
ALT: 23 U/L (ref 0–44)
AST: 20 U/L (ref 15–41)
Albumin: 4 g/dL (ref 3.5–5.0)
Alkaline Phosphatase: 88 U/L (ref 38–126)
Anion gap: 9 (ref 5–15)
BUN: 14 mg/dL (ref 8–23)
CO2: 27 mmol/L (ref 22–32)
Calcium: 8.7 mg/dL — ABNORMAL LOW (ref 8.9–10.3)
Chloride: 99 mmol/L (ref 98–111)
Creatinine: 0.72 mg/dL (ref 0.61–1.24)
GFR, Estimated: >60 mL/min (ref >60)
Glucose, Bld: 128 mg/dL — ABNORMAL HIGH (ref 70–99)
Potassium: 3.7 mmol/L (ref 3.5–5.1)
Sodium: 135 mmol/L (ref 135–145)
Total Bilirubin: 0.5 mg/dL (ref 0.0–1.2)
Total Protein: 6.6 g/dL (ref 6.5–8.1)

## 2024-04-24 MED ORDER — SODIUM CHLORIDE 0.9 % IV SOLN
1000.0000 mg/m2 | Freq: Once | INTRAVENOUS | Status: AC
  Administered 2024-04-24: 1976 mg via INTRAVENOUS
  Filled 2024-04-24: qty 51.97

## 2024-04-24 MED ORDER — SODIUM CHLORIDE 0.9 % IV SOLN
INTRAVENOUS | Status: DC
Start: 2024-04-24 — End: 2024-04-24
  Filled 2024-04-24: qty 250

## 2024-04-24 MED ORDER — HEPARIN SOD (PORK) LOCK FLUSH 100 UNIT/ML IV SOLN
500.0000 [IU] | Freq: Once | INTRAVENOUS | Status: AC | PRN
  Administered 2024-04-24: 500 [IU]
  Filled 2024-04-24: qty 5

## 2024-04-24 MED ORDER — PROCHLORPERAZINE MALEATE 10 MG PO TABS
10.0000 mg | ORAL_TABLET | Freq: Once | ORAL | Status: AC
  Administered 2024-04-24: 10 mg via ORAL
  Filled 2024-04-24: qty 1

## 2024-04-24 NOTE — Assessment & Plan Note (Signed)
 Chemotherapy plan as listed above

## 2024-04-24 NOTE — Assessment & Plan Note (Signed)
 Chronic respiratory failure on Oxygen .  Continue inhalers.

## 2024-04-24 NOTE — Patient Instructions (Signed)
 [facility] - A DEPT OF [facility]  Discharge Instructions: Thank you for choosing [facility] to provide your oncology and hematology care.  If you have a lab appointment with the Cancer Center, please go directly to the Cancer Center and check in at the registration area.  Wear comfortable clothing and clothing appropriate for easy access to any Portacath or PICC line.   We strive to give you quality time with your provider. You may need to reschedule your appointment if you arrive late (15 or more minutes).  Arriving late affects you and other patients whose appointments are after yours.  Also, if you miss three or more appointments without notifying the office, you may be dismissed from the clinic at the provider's discretion.      For prescription refill requests, have your pharmacy contact our office and allow 72 hours for refills to be completed.    Today you received the following chemotherapy and/or immunotherapy agents Gemzar      To help prevent nausea and vomiting after your treatment, we encourage you to take your nausea medication as directed.  BELOW ARE SYMPTOMS THAT SHOULD BE REPORTED IMMEDIATELY: *FEVER GREATER THAN 100.4 F (38 C) OR HIGHER *CHILLS OR SWEATING *NAUSEA AND VOMITING THAT IS NOT CONTROLLED WITH YOUR NAUSEA MEDICATION *UNUSUAL SHORTNESS OF BREATH *UNUSUAL BRUISING OR BLEEDING *URINARY PROBLEMS (pain or burning when urinating, or frequent urination) *BOWEL PROBLEMS (unusual diarrhea, constipation, pain near the anus) TENDERNESS IN MOUTH AND THROAT WITH OR WITHOUT PRESENCE OF ULCERS (sore throat, sores in mouth, or a toothache) UNUSUAL RASH, SWELLING OR PAIN  UNUSUAL VAGINAL DISCHARGE OR ITCHING   Items with * indicate a potential emergency and should be followed up as soon as possible or go to the Emergency Department if any problems should occur.  Please show the CHEMOTHERAPY ALERT CARD or IMMUNOTHERAPY ALERT  CARD at check-in to the Emergency Department and triage nurse.  Should you have questions after your visit or need to cancel or reschedule your appointment, please contact [facility] - A DEPT OF [facility]  [phone] and follow the prompts.  Office hours are 8:00 a.m. to 4:30 p.m. Monday - Friday. Please note that voicemails left after 4:00 p.m. may not be returned until the following business day.  We are closed weekends and major holidays. You have access to a nurse at all times for urgent questions. Please call the main number to the clinic [phone] and follow the prompts.  For any non-urgent questions, you may also contact your provider using MyChart. We now offer e-Visits for anyone 60 and older to request care online for non-urgent symptoms. For details visit [email or web address].   Also download the MyChart app! Go to the app store, search "MyChart", open the app, select [place], and log in with your MyChart username and password.

## 2024-04-24 NOTE — Assessment & Plan Note (Signed)
 Continue calcium  1200mg  and Vitamin D  supplementation.  Calcium  has improved.

## 2024-04-24 NOTE — Progress Notes (Signed)
 Hematology/Oncology Progress note [phone] [phone]        CHIEF COMPLAINTS/PURPOSE OF CONSULTATION:  Stage IV lung adenocarcinoma.   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  s/p dose reduced carboplatin , Taxol  and Keytruda  on 06/01/2023 x 4 cycles, poor PS/hospitalization--> Keytruda  maintenance until Feb 2025 --> PET progression--> 01/05/2024 concurrent chemo RT --> May 2025 CT showed slight decrease of lung mass--> June 2025 Gemcitabine .  Currently on Gemcitabine   as systemic maintenance.  Labs are reviewed and discussed with patient. He tolerates treatments well.  Proceed with Cycle 2 D1 Gemcitabine  due to thrombocytopenia     Thrombocytopenia (HCC) Resolved.  Recommend alcohol cessation.   Macrocytosis He drinks alcohol.  Recommend empiric B12 1000mcg supplementation.   Hypocalcemia Continue calcium  1200mg  and Vitamin D  supplementation.  Calcium  has improved.   Encounter for antineoplastic chemotherapy Chemotherapy plan as listed above.   Asthma-COPD overlap syndrome (HCC) Chronic respiratory failure on Oxygen .  Continue inhalers.    Alcohol use Alcohol cessation is discussed with patient. Encourage cessation effort  Orders Placed This Encounter  Procedures   CBC with Differential (Cancer Center Only)    Standing Status:   Future    Expected Date:   05/15/2024    Expiration Date:   05/15/2025   CMP (Cancer Center only)    Standing Status:   Future    Expected Date:   05/15/2024    Expiration Date:   05/15/2025   CBC with Differential (Cancer Center Only)    Standing Status:   Future    Expected Date:   05/29/2024    Expiration Date:   05/29/2025   CMP (Cancer Center only)    Standing  Status:   Future    Expected Date:   05/29/2024    Expiration Date:   05/29/2025   Follow up in 2 weeks  All questions were answered. The patient knows to call the clinic with any problems, questions or concerns.  Zelphia Cap, MD, PhD [facility] Hematology Oncology 04/24/2024    HISTORY OF PRESENTING ILLNESS:  Austin Patterson 68 y.o. male presents to establish care for Stage IV lung adenocarcinoma.  Patient previously followed up with Dr. Clista who left practice. He establish care with on 03/02/2024  I have reviewed his chart and materials related to his cancer extensively and collaborated history with the patient. Summary of oncologic history is as follows: Oncology History  Stage IV adenocarcinoma of lung (HCC)  04/19/2023 Imaging   PET scan showed 1. 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 2. Right lower lobe hypermetabolic pulmonary nodule, favoring synchronous primary over pulmonary metastasis. 3. Incidental findings, including: Aortic atherosclerosis (ICD10-I70.0), coronary artery atherosclerosis and emphysema (ICD10-J43.9). Prostatomegaly. Sinus disease.   05/18/2023 Initial Diagnosis   Sarcomatoid carcinoma of lung (HCC)   05/20/2023 Cancer Staging   Staging form: Lung, AJCC 8th Edition - Clinical: Stage IV (cT4, cN3, cM1) - Signed by Agrawal, Kavita, MD on 05/20/2023 Stage prefix: Initial diagnosis   06/01/2023 - 11/08/2023 Chemotherapy   Patient is on Treatment Plan : LUNG NSCLC Carboplatin  (6) + Paclitaxel  (200) + Pembrolizumab  (200) D1 q21d x 4 cycles / Pembrolizumab  (200) Maintenance D1 q21d  08/30/2023 Imaging   CT chest with contrast improving right middle lobe mass, corresponding to the patient's known primary bronchogenic carcinoma. Small mediastinal and right perihilar nodal metastases are also improving.   Associated bilateral lower lobe  pulmonary metastases, overall improved, although one right lower lobe nodular opacity is mildly progressive. Attention on follow-up is suggested.   Stable bilateral adrenal adenomas.   Aortic Atherosclerosis (ICD10-I70.0) and Emphysema (ICD10-J43.9).   11/26/2023 Imaging   CT chest abdomen pelvis with contrast 1. Interval progression of disease. The dominant mass within the right middle lobe has increased in size in the interval. There is also been significant increase in size of left lower lobe metastasis. Stable to mild increase in size of metastasis to the posterior right lower lobe. 2. Stable appearance of mediastinal and right hilar lymph nodes. 3. No signs of metastatic disease within the abdomen or pelvis. 4. Aortic Atherosclerosis (ICD10-I70.0) and Emphysema (ICD10-J43.9).   12/14/2023 Imaging   PET scan showed 1. Disease progression as evidenced by hypermetabolic nodules and masses in the lungs bilaterally as well as hypermetabolic metastatic mediastinal, hilar and periportal lymph nodes. 2. Bilateral adrenal adenomas. 3. Gastric wall thickening. 4. Aortic atherosclerosis (ICD10-I70.0). Coronary artery calcification. 5.  Emphysema (ICD10-J43.9)   01/06/2024 - 03/02/2024 Chemotherapy   Patient is on Treatment Plan : LUNG Carboplatin  + Paclitaxel  + XRT q7d     03/17/2024 Imaging   CT chest abdomen pelvis w contrast showed Overall slightly improved from previous examination. The large lung masses involving the middle lobe and left lower lobe appears slightly smaller today. Lymph nodes in the mediastinum, right hilum and upper abdomen are also slightly smaller. No new mass lesion, lymph node enlargement identified. Extensive vascular calcifications with some areas of stenosis particularly the common femoral arteries bilaterally. Please correlate with symptoms. Fatty liver infiltration.   Advanced emphysematous lung changes.   One new small right-sided lung 4 mm nodule  identified. Recommend attention on follow-up.     03/23/2024 -  Chemotherapy   Patient is on Treatment Plan : LUNG Gemcitabine  (1000) D1,8 q21d      He reports feeling ok today.  + increased SOB to his baseline level. Chronic respiratory failure No nausea vomiting diarrhea.  He cuts back on alcohol use.   MEDICAL HISTORY:  Past Medical History:  Diagnosis Date   Abdominal pain 12/07/2022   Acute urinary retention 12/07/2022   Chest pain 01/24/2022   COPD (chronic obstructive pulmonary disease) (HCC)    GERD (gastroesophageal reflux disease)    Hemothorax on right 05/11/2023   Hyperlipidemia    Hypertension    Pleuritic pain 05/10/2023   Pneumothorax after biopsy 05/11/2023   Post procedure discomfort 05/13/2023   Pulmonary fibrosis (HCC) 11/2015    SURGICAL HISTORY: Past Surgical History:  Procedure Laterality Date   COLONOSCOPY     CORONARY STENT PLACEMENT     ESOPHAGOGASTRODUODENOSCOPY (EGD) WITH PROPOFOL  N/A 09/23/2016   Procedure: ESOPHAGOGASTRODUODENOSCOPY (EGD) WITH PROPOFOL ;  Surgeon: Ruel Kung, MD;  Location: [facility];  Service: Endoscopy;  Laterality: N/A;   IR IMAGING GUIDED PORT INSERTION  05/26/2023   IR IMAGING GUIDED PORT INSERTION  07/30/2023   IR RADIOLOGIST EVAL & MGMT  07/20/2023   KYPHOPLASTY N/A 03/14/2020   Procedure: T7 & T11 KYPHOPLASTY;  Surgeon: Kathlynn Sharper, MD;  Location: [facility];  Service: Orthopedics;  Laterality: N/A;   PORT-A-CATH REMOVAL N/A 06/14/2023   Procedure: REMOVAL PORT-A-CATH;  Surgeon: Tye Millet, DO;  Location: [facility];  Service: General;  Laterality:  N/A;   SHOULDER ACROMIOPLASTY      SOCIAL HISTORY: Social History   Socioeconomic History   Marital status: Divorced    Spouse name: Not on file   Number of children: Not on file   Years of education: Not on file   Highest education level: Not on file  Occupational History   Not on file  Tobacco Use   Smoking status: Former    Current packs/day: 0.00    Average  packs/day: 2.0 packs/day for 50.0 years (100.0 ttl pk-yrs)    Types: Cigarettes    Start date: 10/13/1965    Quit date: 10/14/2015    Years since quitting: 8.5   Smokeless tobacco: Former  Building services engineer status: Never Used  Substance and Sexual Activity   Alcohol use: Yes    Alcohol/week: 56.0 standard drinks of alcohol    Types: 56 Cans of beer per week    Comment: I sit around and drink beer, that's all I got to do   Drug use: No   Sexual activity: Not Currently  Other Topics Concern   Not on file  Social History Narrative   Not on file   Social Drivers of Health   Financial Resource Strain: Not on file  Food Insecurity: No Food Insecurity (12/29/2023)   Hunger Vital Sign    Worried About Running Out of Food in the Last Year: Never true    Ran Out of Food in the Last Year: Never true  Transportation Needs: Unmet Transportation Needs (01/27/2024)   PRAPARE - Administrator, Civil Service (Medical): Yes    Lack of Transportation (Non-Medical): Yes  Physical Activity: Not on file  Stress: Not on file  Social Connections: Socially Isolated (12/29/2023)   Social Connection and Isolation Panel    Frequency of Communication with Friends and Family: Three times a week    Frequency of Social Gatherings with Friends and Family: Once a week    Attends Religious Services: Never    Database administrator or Organizations: No    Attends Banker Meetings: Never    Marital Status: Divorced  Catering manager Violence: Not At Risk (12/29/2023)   Humiliation, Afraid, Rape, and Kick questionnaire    Fear of Current or Ex-Partner: No    Emotionally Abused: No    Physically Abused: No    Sexually Abused: No    FAMILY HISTORY: Family History  Problem Relation Age of Onset   Heart disease Mother     ALLERGIES:  is allergic to amoxicillin  and tizanidine .  MEDICATIONS:  Current Outpatient Medications  Medication Sig Dispense Refill   acetaminophen   (TYLENOL ) 500 MG tablet Take 1,000 mg by mouth every 6 (six) hours as needed for moderate pain or headache.     albuterol  (VENTOLIN  HFA) 108 (90 Base) MCG/ACT inhaler Inhale 2 puffs into the lungs every 6 (six) hours as needed for wheezing or shortness of breath. 8 g 11   amLODipine  (NORVASC ) 5 MG tablet Take 1 tablet (5 mg total) by mouth at bedtime. Skip the dose if systolic BP less than 130 mmHg 30 tablet 5   aspirin  EC 81 MG tablet Take 1 tablet (81 mg total) by mouth daily. 90 tablet 3   atorvastatin  (LIPITOR) 40 MG tablet Take 1 tablet (40 mg total) by mouth at bedtime. 90 tablet 3   CALCIUM  600/VITAMIN D  600-10 MG-MCG TABS Take 1 tablet by mouth 2 (two) times daily.  clopidogrel  (PLAVIX ) 75 MG tablet Take 1 tablet (75 mg total) by mouth daily. 90 tablet 3   Dupilumab  (DUPIXENT ) 300 MG/2ML SOPN Inject 300 mg into the skin every 14 (fourteen) days. 12 mL 1   EPINEPHrine  0.3 mg/0.3 mL IJ SOAJ injection Inject 0.3 mg into the muscle as needed for anaphylaxis.     ferrous sulfate  325 (65 FE) MG tablet Take 1 tablet (325 mg total) by mouth 2 (two) times daily with a meal. (Patient taking differently: Take 325 mg by mouth daily with breakfast. prn) 60 tablet 2   gabapentin  (NEURONTIN ) 300 MG capsule TAKE 1 CAPSULE BY MOUTH THREE TIMES A DAY 90 capsule 1   ipratropium-albuterol  (DUONEB) 0.5-2.5 (3) MG/3ML SOLN INHALE 1 VIAL THROUGH NEBULIZER EVERY 6 HOURS 270 mL 2   lidocaine -prilocaine  (EMLA ) cream Apply to affected area once 30 g 5   magnesium  oxide (MAG-OX) 400 (241.3 Mg) MG tablet Take 1 tablet (400 mg total) by mouth daily. 30 tablet 0   metoprolol  succinate (TOPROL -XL) 50 MG 24 hr tablet Take 50 mg by mouth daily.     montelukast  (SINGULAIR ) 10 MG tablet Take 10 mg by mouth daily.     Multiple Vitamin (MULTIVITAMIN WITH MINERALS) TABS tablet Take 1 tablet by mouth daily. 30 tablet 0   omega-3 acid ethyl esters (LOVAZA) 1 g capsule Take 1 g by mouth daily.     oxyCODONE  (OXY IR/ROXICODONE ) 5  MG immediate release tablet Take 1 tablet (5 mg total) by mouth every 8 (eight) hours as needed for severe pain (pain score 7-10). 90 tablet 0   pantoprazole  (PROTONIX ) 40 MG tablet Take 1 tablet (40 mg total) by mouth 2 (two) times daily. 90 tablet 3   potassium chloride  SA (KLOR-CON  M) 20 MEQ tablet Take 20 mEq by mouth daily.     predniSONE  (DELTASONE ) 10 MG tablet TAKE 1 TABLET (10 MG TOTAL) BY MOUTH DAILY AS NEEDED. TAKE AS DIRECTED. 30 tablet 4   Spacer/Aero-Holding Chambers (AEROCHAMBER MV) inhaler Use as instructed 1 each 0   guaiFENesin -dextromethorphan  (ROBITUSSIN DM) 100-10 MG/5ML syrup Take 10 mLs by mouth every 6 (six) hours as needed for cough. (Patient not taking: Reported on 04/24/2024) 118 mL 0   sucralfate  (CARAFATE ) 1 g tablet Take 1 tablet (1 g total) by mouth 3 (three) times daily before meals. Dissolve in 4tbs warm water , swish and swallow (Patient not taking: Reported on 04/24/2024) 90 tablet 4   No current facility-administered medications for this visit.   Facility-Administered Medications Ordered in Other Visits  Medication Dose Route Frequency Provider Last Rate Last Admin   0.9 %  sodium chloride  infusion   Intravenous Continuous Babara Call, MD   Stopped at 04/24/24 1121   heparin  lock flush 100 unit/mL  500 Units Intravenous Once Nashiya Disbrow, MD        Review of Systems  Constitutional:  Positive for fatigue. Negative for appetite change, chills and fever.  HENT:   Negative for hearing loss and voice change.   Eyes:  Negative for eye problems and icterus.  Respiratory:  Positive for shortness of breath. Negative for chest tightness and cough.   Cardiovascular:  Negative for chest pain and leg swelling.  Gastrointestinal:  Negative for abdominal distention and abdominal pain.  Endocrine: Negative for hot flashes.  Genitourinary:  Negative for difficulty urinating, dysuria and frequency.   Musculoskeletal:  Negative for arthralgias.  Skin:  Negative for itching and rash.   Neurological:  Negative for light-headedness and numbness.  Hematological:  Negative for adenopathy. Does not bruise/bleed easily.  Psychiatric/Behavioral:  Negative for confusion.      PHYSICAL EXAMINATION: ECOG PERFORMANCE STATUS: 2 - Symptomatic, <50% confined to bed  Vitals:   04/24/24 0923  BP: 137/81  Pulse: 85  Resp: 18  Temp: (!) 95.7 F (35.4 C)  SpO2: 94%   Filed Weights   04/24/24 0923  Weight: 189 lb 14.4 oz (86.1 kg)    Physical Exam Constitutional:      General: He is not in acute distress.    Appearance: He is not diaphoretic.  HENT:     Head: Normocephalic and atraumatic.     Mouth/Throat:     Pharynx: No oropharyngeal exudate.  Eyes:     General: No scleral icterus.    Pupils: Pupils are equal, round, and reactive to light.  Cardiovascular:     Rate and Rhythm: Normal rate and regular rhythm.  Pulmonary:     Effort: Pulmonary effort is normal. No respiratory distress.     Comments: Decreased breath sounds bilaterally. On nasal cannula oxygen  Abdominal:     General: Bowel sounds are normal. There is no distension.     Palpations: Abdomen is soft.  Musculoskeletal:        General: Normal range of motion.     Cervical back: Normal range of motion and neck supple.  Skin:    General: Skin is warm and dry.     Findings: No erythema.  Neurological:     Mental Status: He is alert and oriented to person, place, and time. Mental status is at baseline.     Motor: No abnormal muscle tone.  Psychiatric:        Mood and Affect: Affect normal.      LABORATORY DATA:  I have reviewed the data as listed    Latest Ref Rng & Units 04/24/2024    8:43 AM 04/10/2024   10:14 AM 04/03/2024    8:57 AM  CBC  WBC 4.0 - 10.5 K/uL 6.4  3.8  3.5   Hemoglobin 13.0 - 17.0 g/dL 89.9  89.5  9.4   Hematocrit 39.0 - 52.0 % 30.8  31.5  27.9   Platelets 150 - 400 K/uL 211  293  79       Latest Ref Rng & Units 04/24/2024    8:43 AM 04/10/2024   10:14 AM 04/03/2024    8:57  AM  CMP  Glucose 70 - 99 mg/dL 871  880  880   BUN 8 - 23 mg/dL 14  18  15    Creatinine 0.61 - 1.24 mg/dL 9.27  9.28  9.24   Sodium 135 - 145 mmol/L 135  131  131   Potassium 3.5 - 5.1 mmol/L 3.7  3.8  4.1   Chloride 98 - 111 mmol/L 99  96  95   CO2 22 - 32 mmol/L 27  25  25    Calcium  8.9 - 10.3 mg/dL 8.7  8.8  9.1   Total Protein 6.5 - 8.1 g/dL 6.6  7.0  6.8   Total Bilirubin 0.0 - 1.2 mg/dL 0.5  0.8  0.8   Alkaline Phos 38 - 126 U/L 88  75  71   AST 15 - 41 U/L 20  25  36   ALT 0 - 44 U/L 23  25  49      RADIOGRAPHIC STUDIES: I have personally reviewed the radiological images as listed and agreed with the findings in the report.  No results found.

## 2024-04-24 NOTE — Assessment & Plan Note (Signed)
 Resolved.  Recommend alcohol cessation.

## 2024-04-24 NOTE — Progress Notes (Signed)
 Patient denies new or acute problems/concerns today.

## 2024-04-24 NOTE — Assessment & Plan Note (Signed)
 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  s/p dose reduced carboplatin , Taxol  and Keytruda  on 06/01/2023 x 4 cycles, poor PS/hospitalization--> Keytruda  maintenance until Feb 2025 --> PET progression--> 01/05/2024 concurrent chemo RT --> May 2025 CT showed slight decrease of lung mass--> June 2025 Gemcitabine .  Currently on Gemcitabine   as systemic maintenance.  Labs are reviewed and discussed with patient. He tolerates treatments well.  Proceed with Cycle 2 D1 Gemcitabine  due to thrombocytopenia

## 2024-04-24 NOTE — Assessment & Plan Note (Signed)
 He drinks alcohol.  Recommend empiric B12 1000mcg supplementation.

## 2024-04-24 NOTE — Assessment & Plan Note (Signed)
 Alcohol cessation is discussed with patient. Encourage cessation effort

## 2024-04-28 ENCOUNTER — Other Ambulatory Visit: Payer: Self-pay | Admitting: Pulmonary Disease

## 2024-05-01 ENCOUNTER — Encounter

## 2024-05-01 ENCOUNTER — Ambulatory Visit: Admitting: Oncology

## 2024-05-01 ENCOUNTER — Ambulatory Visit

## 2024-05-01 ENCOUNTER — Other Ambulatory Visit

## 2024-05-08 ENCOUNTER — Encounter: Payer: Self-pay | Admitting: Oncology

## 2024-05-08 ENCOUNTER — Inpatient Hospital Stay (HOSPITAL_BASED_OUTPATIENT_CLINIC_OR_DEPARTMENT_OTHER): Admitting: Oncology

## 2024-05-08 ENCOUNTER — Inpatient Hospital Stay

## 2024-05-08 VITALS — BP 133/70 | HR 88 | Resp 18

## 2024-05-08 VITALS — BP 129/66 | HR 88 | Temp 97.3°F | Resp 20 | Ht 67.0 in | Wt 190.7 lb

## 2024-05-08 DIAGNOSIS — C3491 Malignant neoplasm of unspecified part of right bronchus or lung: Secondary | ICD-10-CM

## 2024-05-08 DIAGNOSIS — Z5111 Encounter for antineoplastic chemotherapy: Secondary | ICD-10-CM | POA: Diagnosis not present

## 2024-05-08 DIAGNOSIS — J4489 Other specified chronic obstructive pulmonary disease: Secondary | ICD-10-CM | POA: Diagnosis not present

## 2024-05-08 LAB — CMP (CANCER CENTER ONLY)
ALT: 17 U/L (ref 0–44)
AST: 19 U/L (ref 15–41)
Albumin: 4.3 g/dL (ref 3.5–5.0)
Alkaline Phosphatase: 89 U/L (ref 38–126)
Anion gap: 7 (ref 5–15)
BUN: 19 mg/dL (ref 8–23)
CO2: 26 mmol/L (ref 22–32)
Calcium: 8.7 mg/dL — ABNORMAL LOW (ref 8.9–10.3)
Chloride: 100 mmol/L (ref 98–111)
Creatinine: 0.75 mg/dL (ref 0.61–1.24)
GFR, Estimated: >60 mL/min (ref >60)
Glucose, Bld: 114 mg/dL — ABNORMAL HIGH (ref 70–99)
Potassium: 4 mmol/L (ref 3.5–5.1)
Sodium: 133 mmol/L — ABNORMAL LOW (ref 135–145)
Total Bilirubin: 0.8 mg/dL (ref 0.0–1.2)
Total Protein: 6.9 g/dL (ref 6.5–8.1)

## 2024-05-08 LAB — CBC WITH DIFFERENTIAL (CANCER CENTER ONLY)
Abs Immature Granulocytes: 0.04 K/uL (ref 0.00–0.07)
Basophils Absolute: 0 K/uL (ref 0.0–0.1)
Basophils Relative: 0 %
Eosinophils Absolute: 0.1 K/uL (ref 0.0–0.5)
Eosinophils Relative: 1 %
HCT: 32.1 % — ABNORMAL LOW (ref 39.0–52.0)
Hemoglobin: 10.5 g/dL — ABNORMAL LOW (ref 13.0–17.0)
Immature Granulocytes: 0 %
Lymphocytes Relative: 7 %
Lymphs Abs: 0.7 K/uL (ref 0.7–4.0)
MCH: 34.2 pg — ABNORMAL HIGH (ref 26.0–34.0)
MCHC: 32.7 g/dL (ref 30.0–36.0)
MCV: 104.6 fL — ABNORMAL HIGH (ref 80.0–100.0)
Monocytes Absolute: 1.2 K/uL — ABNORMAL HIGH (ref 0.1–1.0)
Monocytes Relative: 13 %
Neutro Abs: 7.5 K/uL (ref 1.7–7.7)
Neutrophils Relative %: 79 %
Platelet Count: 306 K/uL (ref 150–400)
RBC: 3.07 MIL/uL — ABNORMAL LOW (ref 4.22–5.81)
RDW: 16.4 % — ABNORMAL HIGH (ref 11.5–15.5)
WBC Count: 9.5 K/uL (ref 4.0–10.5)
nRBC: 0 % (ref 0.0–0.2)

## 2024-05-08 MED ORDER — SODIUM CHLORIDE 0.9 % IV SOLN
1000.0000 mg/m2 | Freq: Once | INTRAVENOUS | Status: AC
  Administered 2024-05-08: 1976 mg via INTRAVENOUS
  Filled 2024-05-08: qty 51.97

## 2024-05-08 MED ORDER — GUAIFENESIN-DM 100-10 MG/5ML PO SYRP
10.0000 mL | ORAL_SOLUTION | Freq: Four times a day (QID) | ORAL | 1 refills | Status: DC | PRN
Start: 2024-05-08 — End: 2024-06-05

## 2024-05-08 MED ORDER — PROCHLORPERAZINE MALEATE 10 MG PO TABS
10.0000 mg | ORAL_TABLET | Freq: Once | ORAL | Status: AC
Start: 2024-05-08 — End: 2024-05-08
  Administered 2024-05-08: 10 mg via ORAL
  Filled 2024-05-08: qty 1

## 2024-05-08 MED ORDER — SODIUM CHLORIDE 0.9 % IV SOLN
INTRAVENOUS | Status: DC
  Filled 2024-05-08: qty 250

## 2024-05-08 MED ORDER — HEPARIN SOD (PORK) LOCK FLUSH 100 UNIT/ML IV SOLN
500.0000 [IU] | Freq: Once | INTRAVENOUS | Status: AC | PRN
  Administered 2024-05-08: 500 [IU]
  Filled 2024-05-08: qty 5

## 2024-05-08 NOTE — Assessment & Plan Note (Addendum)
 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  s/p dose reduced carboplatin , Taxol  and Keytruda  on 06/01/2023 x 4 cycles, poor PS/hospitalization--> Keytruda  maintenance until Feb 2025 --> PET progression--> 01/05/2024 concurrent chemo RT --> May 2025 CT showed slight decrease of lung mass--> June 2025 Gemcitabine .  Currently on Gemcitabine   as systemic maintenance.  Labs are reviewed and discussed with patient. He tolerates treatments well.  Proceed with Cycle 2 D15 Gemcitabine 

## 2024-05-08 NOTE — Patient Instructions (Signed)
 [facility] - A DEPT OF [facility]  Discharge Instructions: Thank you for choosing [facility] to provide your oncology and hematology care.  If you have a lab appointment with the Cancer Center, please go directly to the Cancer Center and check in at the registration area.  Wear comfortable clothing and clothing appropriate for easy access to any Portacath or PICC line.   We strive to give you quality time with your provider. You may need to reschedule your appointment if you arrive late (15 or more minutes).  Arriving late affects you and other patients whose appointments are after yours.  Also, if you miss three or more appointments without notifying the office, you may be dismissed from the clinic at the provider's discretion.      For prescription refill requests, have your pharmacy contact our office and allow 72 hours for refills to be completed.    Today you received the following chemotherapy and/or immunotherapy agents Gemzar      To help prevent nausea and vomiting after your treatment, we encourage you to take your nausea medication as directed.  BELOW ARE SYMPTOMS THAT SHOULD BE REPORTED IMMEDIATELY: *FEVER GREATER THAN 100.4 F (38 C) OR HIGHER *CHILLS OR SWEATING *NAUSEA AND VOMITING THAT IS NOT CONTROLLED WITH YOUR NAUSEA MEDICATION *UNUSUAL SHORTNESS OF BREATH *UNUSUAL BRUISING OR BLEEDING *URINARY PROBLEMS (pain or burning when urinating, or frequent urination) *BOWEL PROBLEMS (unusual diarrhea, constipation, pain near the anus) TENDERNESS IN MOUTH AND THROAT WITH OR WITHOUT PRESENCE OF ULCERS (sore throat, sores in mouth, or a toothache) UNUSUAL RASH, SWELLING OR PAIN  UNUSUAL VAGINAL DISCHARGE OR ITCHING   Items with * indicate a potential emergency and should be followed up as soon as possible or go to the Emergency Department if any problems should occur.  Please show the CHEMOTHERAPY ALERT CARD or IMMUNOTHERAPY ALERT  CARD at check-in to the Emergency Department and triage nurse.  Should you have questions after your visit or need to cancel or reschedule your appointment, please contact [facility] - A DEPT OF [facility]  [phone] and follow the prompts.  Office hours are 8:00 a.m. to 4:30 p.m. Monday - Friday. Please note that voicemails left after 4:00 p.m. may not be returned until the following business day.  We are closed weekends and major holidays. You have access to a nurse at all times for urgent questions. Please call the main number to the clinic [phone] and follow the prompts.  For any non-urgent questions, you may also contact your provider using MyChart. We now offer e-Visits for anyone 60 and older to request care online for non-urgent symptoms. For details visit [email or web address].   Also download the MyChart app! Go to the app store, search "MyChart", open the app, select [place], and log in with your MyChart username and password.

## 2024-05-08 NOTE — Assessment & Plan Note (Signed)
 Continue calcium  1200mg  and Vitamin D  supplementation.  Calcium  has improved.

## 2024-05-08 NOTE — Assessment & Plan Note (Signed)
 Chronic respiratory failure on Oxygen .  Continue inhalers.

## 2024-05-08 NOTE — Assessment & Plan Note (Signed)
 Chemotherapy plan as listed above

## 2024-05-08 NOTE — Progress Notes (Signed)
 Hematology/Oncology Progress note [phone] [phone]        CHIEF COMPLAINTS/PURPOSE OF CONSULTATION:  Stage IV lung adenocarcinoma.   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  s/p dose reduced carboplatin , Taxol  and Keytruda  on 06/01/2023 x 4 cycles, poor PS/hospitalization--> Keytruda  maintenance until Feb 2025 --> PET progression--> 01/05/2024 concurrent chemo RT --> May 2025 CT showed slight decrease of lung mass--> June 2025 Gemcitabine .  Currently on Gemcitabine   as systemic maintenance.  Labs are reviewed and discussed with patient. He tolerates treatments well.  Proceed with Cycle 2 D15 Gemcitabine      Asthma-COPD overlap syndrome (HCC) Chronic respiratory failure on Oxygen .  Continue inhalers.    Encounter for antineoplastic chemotherapy Chemotherapy plan as listed above.   Hypocalcemia Continue calcium  1200mg  and Vitamin D  supplementation.  Calcium  has improved.   Orders Placed This Encounter  Procedures   CBC with Differential (Cancer Center Only)    Standing Status:   Future    Expected Date:   05/22/2024    Expiration Date:   05/22/2025   CMP (Cancer Center only)    Standing Status:   Future    Expected Date:   05/22/2024    Expiration Date:   05/22/2025   CBC with Differential (Cancer Center Only)    Standing Status:   Future    Expected Date:   06/05/2024    Expiration Date:   06/05/2025   CMP (Cancer Center only)    Standing Status:   Future    Expected Date:   06/05/2024    Expiration Date:   06/05/2025   Follow up in 2 weeks  All questions were answered. The patient knows to call the clinic with any problems, questions or concerns.  Zelphia Cap, MD, PhD [facility] Hematology  Oncology 05/08/2024    HISTORY OF PRESENTING ILLNESS:  Austin Patterson 68 y.o. male presents to establish care for Stage IV lung adenocarcinoma.  Patient previously followed up with Dr. Clista who left practice. He establish care with on 03/02/2024  I have reviewed his chart and materials related to his cancer extensively and collaborated history with the patient. Summary of oncologic history is as follows: Oncology History  Stage IV adenocarcinoma of lung (HCC)  04/19/2023 Imaging   PET scan showed 1. 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 2. Right lower lobe hypermetabolic pulmonary nodule, favoring synchronous primary over pulmonary metastasis. 3. Incidental findings, including: Aortic atherosclerosis (ICD10-I70.0), coronary artery atherosclerosis and emphysema (ICD10-J43.9). Prostatomegaly. Sinus disease.   05/18/2023 Initial Diagnosis   Sarcomatoid carcinoma of lung (HCC)   05/20/2023 Cancer Staging   Staging form: Lung, AJCC 8th Edition - Clinical: Stage IV (cT4, cN3, cM1) - Signed by Agrawal, Kavita, MD on 05/20/2023 Stage prefix: Initial diagnosis   06/01/2023 - 11/08/2023 Chemotherapy   Patient is on Treatment Plan : LUNG NSCLC Carboplatin  (6) + Paclitaxel  (200) + Pembrolizumab  (200) D1 q21d x 4 cycles / Pembrolizumab  (200) Maintenance D1 q21d     08/30/2023 Imaging   CT chest with contrast improving right middle lobe mass, corresponding to the patient's known primary bronchogenic carcinoma. Small mediastinal and right perihilar nodal metastases are also improving.   Associated bilateral lower  lobe pulmonary metastases, overall improved, although one right lower lobe nodular opacity is mildly progressive. Attention on follow-up is suggested.   Stable bilateral adrenal adenomas.   Aortic Atherosclerosis (ICD10-I70.0) and Emphysema (ICD10-J43.9).    11/26/2023 Imaging   CT chest abdomen pelvis with contrast 1. Interval progression of disease. The dominant mass within the right middle lobe has increased in size in the interval. There is also been significant increase in size of left lower lobe metastasis. Stable to mild increase in size of metastasis to the posterior right lower lobe. 2. Stable appearance of mediastinal and right hilar lymph nodes. 3. No signs of metastatic disease within the abdomen or pelvis. 4. Aortic Atherosclerosis (ICD10-I70.0) and Emphysema (ICD10-J43.9).   12/14/2023 Imaging   PET scan showed 1. Disease progression as evidenced by hypermetabolic nodules and masses in the lungs bilaterally as well as hypermetabolic metastatic mediastinal, hilar and periportal lymph nodes. 2. Bilateral adrenal adenomas. 3. Gastric wall thickening. 4. Aortic atherosclerosis (ICD10-I70.0). Coronary artery calcification. 5.  Emphysema (ICD10-J43.9)   01/06/2024 - 03/02/2024 Chemotherapy   Patient is on Treatment Plan : LUNG Carboplatin  + Paclitaxel  + XRT q7d     03/17/2024 Imaging   CT chest abdomen pelvis w contrast showed Overall slightly improved from previous examination. The large lung masses involving the middle lobe and left lower lobe appears slightly smaller today. Lymph nodes in the mediastinum, right hilum and upper abdomen are also slightly smaller. No new mass lesion, lymph node enlargement identified. Extensive vascular calcifications with some areas of stenosis particularly the common femoral arteries bilaterally. Please correlate with symptoms. Fatty liver infiltration.   Advanced emphysematous lung changes.   One new small right-sided lung 4 mm nodule identified. Recommend attention on follow-up.     03/23/2024 -  Chemotherapy   Patient is on Treatment Plan : LUNG Gemcitabine  (1000) D1,8 q21d      He reports feeling ok today.  + increased SOB to his baseline level. Chronic respiratory failure No nausea  vomiting diarrhea.  He cuts back on alcohol use.   MEDICAL HISTORY:  Past Medical History:  Diagnosis Date   Abdominal pain 12/07/2022   Acute urinary retention 12/07/2022   Chest pain 01/24/2022   COPD (chronic obstructive pulmonary disease) (HCC)    GERD (gastroesophageal reflux disease)    Hemothorax on right 05/11/2023   Hyperlipidemia    Hypertension    Pleuritic pain 05/10/2023   Pneumothorax after biopsy 05/11/2023   Post procedure discomfort 05/13/2023   Pulmonary fibrosis (HCC) 11/2015    SURGICAL HISTORY: Past Surgical History:  Procedure Laterality Date   COLONOSCOPY     CORONARY STENT PLACEMENT     ESOPHAGOGASTRODUODENOSCOPY (EGD) WITH PROPOFOL  N/A 09/23/2016   Procedure: ESOPHAGOGASTRODUODENOSCOPY (EGD) WITH PROPOFOL ;  Surgeon: Ruel Kung, MD;  Location: [facility] END[facility]COPY;  Service: Endoscopy;  Laterality: N/A;   IR IMAGING GUIDED PORT INSERTION  05/26/2023   IR IMAGING GUIDED PORT INSERTION  07/30/2023   IR RADIOLOGIST EVAL & MGMT  07/20/2023   KYPHOPLASTY N/A 03/14/2020   Procedure: T7 & T11 KYPHOPLASTY;  Surgeon: Kathlynn Sharper, MD;  Location: [facility] ORS;  Service: Orthopedics;  Laterality: N/A;   PORT-A-CATH REMOVAL N/A 06/14/2023   Procedure: REMOVAL PORT-A-CATH;  Surgeon: Tye Millet, DO;  Location: [facility] ORS;  Service: General;  Laterality: N/A;   SHOULDER ACROMIOPLASTY      SOCIAL HISTORY: Social History   Socioeconomic History   Marital status: Divorced    Spouse name: Not on file   Number of children:  Not on file   Years of education: Not on file   Highest education level: Not on file  Occupational History   Not on file  Tobacco Use   Smoking status: Former    Current packs/day: 0.00    Average packs/day: 2.0 packs/day for 50.0 years (100.0 ttl pk-yrs)    Types: Cigarettes    Start date: 10/13/1965    Quit date: 10/14/2015    Years since quitting: 8.5   Smokeless tobacco: Former  Building services engineer status: Never Used  Substance and Sexual  Activity   Alcohol use: Yes    Alcohol/week: 56.0 standard drinks of alcohol    Types: 56 Cans of beer per week    Comment: I sit around and drink beer, that's all I got to do   Drug use: No   Sexual activity: Not Currently  Other Topics Concern   Not on file  Social History Narrative   Not on file   Social Drivers of Health   Financial Resource Strain: Not on file  Food Insecurity: No Food Insecurity (12/29/2023)   Hunger Vital Sign    Worried About Running Out of Food in the Last Year: Never true    Ran Out of Food in the Last Year: Never true  Transportation Needs: Unmet Transportation Needs (01/27/2024)   PRAPARE - Administrator, Civil Service (Medical): Yes    Lack of Transportation (Non-Medical): Yes  Physical Activity: Not on file  Stress: Not on file  Social Connections: Socially Isolated (12/29/2023)   Social Connection and Isolation Panel    Frequency of Communication with Friends and Family: Three times a week    Frequency of Social Gatherings with Friends and Family: Once a week    Attends Religious Services: Never    Database administrator or Organizations: No    Attends Banker Meetings: Never    Marital Status: Divorced  Catering manager Violence: Not At Risk (12/29/2023)   Humiliation, Afraid, Rape, and Kick questionnaire    Fear of Current or Ex-Partner: No    Emotionally Abused: No    Physically Abused: No    Sexually Abused: No    FAMILY HISTORY: Family History  Problem Relation Age of Onset   Heart disease Mother     ALLERGIES:  is allergic to amoxicillin  and tizanidine .  MEDICATIONS:  Current Outpatient Medications  Medication Sig Dispense Refill   acetaminophen  (TYLENOL ) 500 MG tablet Take 1,000 mg by mouth every 6 (six) hours as needed for moderate pain or headache.     albuterol  (VENTOLIN  HFA) 108 (90 Base) MCG/ACT inhaler Inhale 2 puffs into the lungs every 6 (six) hours as needed for wheezing or shortness of breath.  8 g 11   amLODipine  (NORVASC ) 5 MG tablet Take 1 tablet (5 mg total) by mouth at bedtime. Skip the dose if systolic BP less than 130 mmHg 30 tablet 5   aspirin  EC 81 MG tablet Take 1 tablet (81 mg total) by mouth daily. 90 tablet 3   atorvastatin  (LIPITOR) 40 MG tablet Take 1 tablet (40 mg total) by mouth at bedtime. 90 tablet 3   CALCIUM  600/VITAMIN D  600-10 MG-MCG TABS Take 1 tablet by mouth 2 (two) times daily.     clopidogrel  (PLAVIX ) 75 MG tablet Take 1 tablet (75 mg total) by mouth daily. 90 tablet 3   Dupilumab  (DUPIXENT ) 300 MG/2ML SOPN Inject 300 mg into the skin every 14 (fourteen) days. 12 mL  1   EPINEPHrine  0.3 mg/0.3 mL IJ SOAJ injection Inject 0.3 mg into the muscle as needed for anaphylaxis.     ferrous sulfate  325 (65 FE) MG tablet Take 1 tablet (325 mg total) by mouth 2 (two) times daily with a meal. (Patient taking differently: Take 325 mg by mouth daily with breakfast. prn) 60 tablet 2   gabapentin  (NEURONTIN ) 300 MG capsule TAKE 1 CAPSULE BY MOUTH THREE TIMES A DAY 90 capsule 1   guaiFENesin -dextromethorphan  (ROBITUSSIN DM) 100-10 MG/5ML syrup Take 10 mLs by mouth every 6 (six) hours as needed for cough. 118 mL 1   ipratropium-albuterol  (DUONEB) 0.5-2.5 (3) MG/3ML SOLN INHALE 1 VIAL THROUGH NEBULIZER EVERY 6 HOURS 270 mL 2   lidocaine -prilocaine  (EMLA ) cream Apply to affected area once 30 g 5   magnesium  oxide (MAG-OX) 400 (241.3 Mg) MG tablet Take 1 tablet (400 mg total) by mouth daily. 30 tablet 0   metoprolol  succinate (TOPROL -XL) 50 MG 24 hr tablet Take 50 mg by mouth daily.     montelukast  (SINGULAIR ) 10 MG tablet Take 10 mg by mouth daily.     Multiple Vitamin (MULTIVITAMIN WITH MINERALS) TABS tablet Take 1 tablet by mouth daily. 30 tablet 0   omega-3 acid ethyl esters (LOVAZA) 1 g capsule Take 1 g by mouth daily.     oxyCODONE  (OXY IR/ROXICODONE ) 5 MG immediate release tablet Take 1 tablet (5 mg total) by mouth every 8 (eight) hours as needed for severe pain (pain score  7-10). 90 tablet 0   pantoprazole  (PROTONIX ) 40 MG tablet Take 1 tablet (40 mg total) by mouth 2 (two) times daily. 90 tablet 3   potassium chloride  SA (KLOR-CON  M) 20 MEQ tablet Take 20 mEq by mouth daily.     predniSONE  (DELTASONE ) 10 MG tablet TAKE 1 TABLET (10 MG TOTAL) BY MOUTH DAILY AS NEEDED. TAKE AS DIRECTED. 30 tablet 4   Spacer/Aero-Holding Chambers (AEROCHAMBER MV) inhaler Use as instructed 1 each 0   sucralfate  (CARAFATE ) 1 g tablet Take 1 tablet (1 g total) by mouth 3 (three) times daily before meals. Dissolve in 4tbs warm water , swish and swallow (Patient not taking: Reported on 04/24/2024) 90 tablet 4   No current facility-administered medications for this visit.   Facility-Administered Medications Ordered in Other Visits  Medication Dose Route Frequency Provider Last Rate Last Admin   0.9 %  sodium chloride  infusion   Intravenous Continuous Babara Call, MD   Stopped at 05/08/24 1027   heparin  lock flush 100 unit/mL  500 Units Intravenous Once Quinteria Chisum, MD        Review of Systems  Constitutional:  Positive for fatigue. Negative for appetite change, chills and fever.  HENT:   Negative for hearing loss and voice change.   Eyes:  Negative for eye problems and icterus.  Respiratory:  Positive for shortness of breath. Negative for chest tightness and cough.   Cardiovascular:  Negative for chest pain and leg swelling.  Gastrointestinal:  Negative for abdominal distention and abdominal pain.  Endocrine: Negative for hot flashes.  Genitourinary:  Negative for difficulty urinating, dysuria and frequency.   Musculoskeletal:  Negative for arthralgias.  Skin:  Negative for itching and rash.  Neurological:  Negative for light-headedness and numbness.  Hematological:  Negative for adenopathy. Does not bruise/bleed easily.  Psychiatric/Behavioral:  Negative for confusion.      PHYSICAL EXAMINATION: ECOG PERFORMANCE STATUS: 2 - Symptomatic, <50% confined to bed  Vitals:   05/08/24 0840   BP: 129/66  Pulse: 88  Resp: 20  Temp: (!) 97.3 F (36.3 C)  SpO2: 97%   Filed Weights   05/08/24 0840  Weight: 190 lb 11.2 oz (86.5 kg)    Physical Exam Constitutional:      General: He is not in acute distress.    Appearance: He is not diaphoretic.  HENT:     Head: Normocephalic and atraumatic.     Mouth/Throat:     Pharynx: No oropharyngeal exudate.  Eyes:     General: No scleral icterus.    Pupils: Pupils are equal, round, and reactive to light.  Cardiovascular:     Rate and Rhythm: Normal rate and regular rhythm.  Pulmonary:     Effort: Pulmonary effort is normal. No respiratory distress.     Comments: Decreased breath sounds bilaterally. On nasal cannula oxygen  Abdominal:     General: Bowel sounds are normal. There is no distension.     Palpations: Abdomen is soft.  Musculoskeletal:        General: Normal range of motion.     Cervical back: Normal range of motion and neck supple.  Skin:    General: Skin is warm and dry.     Findings: No erythema.  Neurological:     Mental Status: He is alert and oriented to person, place, and time. Mental status is at baseline.     Motor: No abnormal muscle tone.  Psychiatric:        Mood and Affect: Affect normal.      LABORATORY DATA:  I have reviewed the data as listed    Latest Ref Rng & Units 05/08/2024    8:24 AM 04/24/2024    8:43 AM 04/10/2024   10:14 AM  CBC  WBC 4.0 - 10.5 K/uL 9.5  6.4  3.8   Hemoglobin 13.0 - 17.0 g/dL 89.4  89.9  89.5   Hematocrit 39.0 - 52.0 % 32.1  30.8  31.5   Platelets 150 - 400 K/uL 306  211  293       Latest Ref Rng & Units 05/08/2024    8:24 AM 04/24/2024    8:43 AM 04/10/2024   10:14 AM  CMP  Glucose 70 - 99 mg/dL 885  871  880   BUN 8 - 23 mg/dL 19  14  18    Creatinine 0.61 - 1.24 mg/dL 9.24  9.27  9.28   Sodium 135 - 145 mmol/L 133  135  131   Potassium 3.5 - 5.1 mmol/L 4.0  3.7  3.8   Chloride 98 - 111 mmol/L 100  99  96   CO2 22 - 32 mmol/L 26  27  25    Calcium  8.9 -  10.3 mg/dL 8.7  8.7  8.8   Total Protein 6.5 - 8.1 g/dL 6.9  6.6  7.0   Total Bilirubin 0.0 - 1.2 mg/dL 0.8  0.5  0.8   Alkaline Phos 38 - 126 U/L 89  88  75   AST 15 - 41 U/L 19  20  25    ALT 0 - 44 U/L 17  23  25       RADIOGRAPHIC STUDIES: I have personally reviewed the radiological images as listed and agreed with the findings in the report. No results found.

## 2024-05-15 ENCOUNTER — Encounter

## 2024-05-15 ENCOUNTER — Other Ambulatory Visit

## 2024-05-15 ENCOUNTER — Ambulatory Visit: Admitting: Oncology

## 2024-05-15 ENCOUNTER — Ambulatory Visit

## 2024-05-22 ENCOUNTER — Encounter: Payer: Self-pay | Admitting: Oncology

## 2024-05-22 ENCOUNTER — Inpatient Hospital Stay

## 2024-05-22 ENCOUNTER — Inpatient Hospital Stay: Attending: Oncology

## 2024-05-22 ENCOUNTER — Inpatient Hospital Stay (HOSPITAL_BASED_OUTPATIENT_CLINIC_OR_DEPARTMENT_OTHER): Admitting: Oncology

## 2024-05-22 VITALS — BP 132/85 | HR 88 | Temp 97.4°F | Ht 67.0 in | Wt 187.7 lb

## 2024-05-22 VITALS — BP 134/84 | HR 96

## 2024-05-22 DIAGNOSIS — Z5111 Encounter for antineoplastic chemotherapy: Secondary | ICD-10-CM

## 2024-05-22 DIAGNOSIS — Z7982 Long term (current) use of aspirin: Secondary | ICD-10-CM | POA: Diagnosis not present

## 2024-05-22 DIAGNOSIS — F1090 Alcohol use, unspecified, uncomplicated: Secondary | ICD-10-CM | POA: Diagnosis not present

## 2024-05-22 DIAGNOSIS — J4489 Other specified chronic obstructive pulmonary disease: Secondary | ICD-10-CM | POA: Diagnosis not present

## 2024-05-22 DIAGNOSIS — Z79899 Other long term (current) drug therapy: Secondary | ICD-10-CM | POA: Diagnosis not present

## 2024-05-22 DIAGNOSIS — Z7902 Long term (current) use of antithrombotics/antiplatelets: Secondary | ICD-10-CM | POA: Insufficient documentation

## 2024-05-22 DIAGNOSIS — D7589 Other specified diseases of blood and blood-forming organs: Secondary | ICD-10-CM | POA: Insufficient documentation

## 2024-05-22 DIAGNOSIS — Z9981 Dependence on supplemental oxygen: Secondary | ICD-10-CM | POA: Diagnosis not present

## 2024-05-22 DIAGNOSIS — Z87891 Personal history of nicotine dependence: Secondary | ICD-10-CM | POA: Diagnosis not present

## 2024-05-22 DIAGNOSIS — C3491 Malignant neoplasm of unspecified part of right bronchus or lung: Secondary | ICD-10-CM | POA: Diagnosis present

## 2024-05-22 LAB — CBC WITH DIFFERENTIAL (CANCER CENTER ONLY)
Abs Immature Granulocytes: 0.09 K/uL — ABNORMAL HIGH (ref 0.00–0.07)
Basophils Absolute: 0 K/uL (ref 0.0–0.1)
Basophils Relative: 0 %
Eosinophils Absolute: 0.1 K/uL (ref 0.0–0.5)
Eosinophils Relative: 1 %
HCT: 33.9 % — ABNORMAL LOW (ref 39.0–52.0)
Hemoglobin: 10.9 g/dL — ABNORMAL LOW (ref 13.0–17.0)
Immature Granulocytes: 1 %
Lymphocytes Relative: 7 %
Lymphs Abs: 0.7 K/uL (ref 0.7–4.0)
MCH: 32.9 pg (ref 26.0–34.0)
MCHC: 32.2 g/dL (ref 30.0–36.0)
MCV: 102.4 fL — ABNORMAL HIGH (ref 80.0–100.0)
Monocytes Absolute: 1.1 K/uL — ABNORMAL HIGH (ref 0.1–1.0)
Monocytes Relative: 11 %
Neutro Abs: 8.2 K/uL — ABNORMAL HIGH (ref 1.7–7.7)
Neutrophils Relative %: 80 %
Platelet Count: 312 K/uL (ref 150–400)
RBC: 3.31 MIL/uL — ABNORMAL LOW (ref 4.22–5.81)
RDW: 16.1 % — ABNORMAL HIGH (ref 11.5–15.5)
WBC Count: 10.2 K/uL (ref 4.0–10.5)
nRBC: 0 % (ref 0.0–0.2)

## 2024-05-22 LAB — CMP (CANCER CENTER ONLY)
ALT: 19 U/L (ref 0–44)
AST: 23 U/L (ref 15–41)
Albumin: 4.1 g/dL (ref 3.5–5.0)
Alkaline Phosphatase: 104 U/L (ref 38–126)
Anion gap: 9 (ref 5–15)
BUN: 18 mg/dL (ref 8–23)
CO2: 27 mmol/L (ref 22–32)
Calcium: 9.3 mg/dL (ref 8.9–10.3)
Chloride: 96 mmol/L — ABNORMAL LOW (ref 98–111)
Creatinine: 0.76 mg/dL (ref 0.61–1.24)
GFR, Estimated: >60 mL/min (ref >60)
Glucose, Bld: 110 mg/dL — ABNORMAL HIGH (ref 70–99)
Potassium: 4 mmol/L (ref 3.5–5.1)
Sodium: 132 mmol/L — ABNORMAL LOW (ref 135–145)
Total Bilirubin: 0.6 mg/dL (ref 0.0–1.2)
Total Protein: 6.9 g/dL (ref 6.5–8.1)

## 2024-05-22 MED ORDER — SODIUM CHLORIDE 0.9 % IV SOLN
1000.0000 mg/m2 | Freq: Once | INTRAVENOUS | Status: AC
  Administered 2024-05-22: 1976 mg via INTRAVENOUS
  Filled 2024-05-22: qty 51.97

## 2024-05-22 MED ORDER — SODIUM CHLORIDE 0.9 % IV SOLN
INTRAVENOUS | Status: DC
  Filled 2024-05-22: qty 250

## 2024-05-22 MED ORDER — HEPARIN SOD (PORK) LOCK FLUSH 100 UNIT/ML IV SOLN
500.0000 [IU] | Freq: Once | INTRAVENOUS | Status: DC | PRN
Start: 2024-05-22 — End: 2024-05-22
  Filled 2024-05-22: qty 5

## 2024-05-22 MED ORDER — OXYCODONE HCL 5 MG PO TABS: 5.0000 mg | ORAL_TABLET | Freq: Three times a day (TID) | ORAL | 0 refills | Status: DC | PRN

## 2024-05-22 MED ORDER — PROCHLORPERAZINE MALEATE 10 MG PO TABS
10.0000 mg | ORAL_TABLET | Freq: Once | ORAL | Status: AC
  Administered 2024-05-22: 10 mg via ORAL
  Filled 2024-05-22: qty 1

## 2024-05-22 NOTE — Assessment & Plan Note (Signed)
 Alcohol cessation is discussed with patient. Encourage cessation effort

## 2024-05-22 NOTE — Progress Notes (Signed)
 Pt here for follow up. NO new concerns. Pt requesting Oxycodone  refill

## 2024-05-22 NOTE — Assessment & Plan Note (Signed)
 Chemotherapy plan as listed above

## 2024-05-22 NOTE — Assessment & Plan Note (Signed)
 He drinks alcohol.  Recommend empiric B12 1000mcg supplementation.

## 2024-05-22 NOTE — Assessment & Plan Note (Signed)
 Chronic respiratory failure on Oxygen .  Continue inhalers.

## 2024-05-22 NOTE — Assessment & Plan Note (Signed)
 Continue calcium  1200mg  and Vitamin D  supplementation.  Calcium  has improved.

## 2024-05-22 NOTE — Progress Notes (Signed)
 Hematology/Oncology Progress note [phone] [phone]        CHIEF COMPLAINTS/PURPOSE OF CONSULTATION:  Stage IV lung adenocarcinoma.   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  s/p dose reduced carboplatin , Taxol  and Keytruda  on 06/01/2023 x 4 cycles, poor PS/hospitalization--> Keytruda  maintenance until Feb 2025 --> PET progression--> 01/05/2024 concurrent chemo RT --> May 2025 CT showed slight decrease of lung mass--> June 2025 Gemcitabine .  Currently on Gemcitabine   as systemic maintenance.  Labs are reviewed and discussed with patient. He tolerates treatments well.  Proceed with Cycle 3 D1 Gemcitabine   Repeat CT scan after cycle 3.    Alcohol use Alcohol cessation is discussed with patient. Encourage cessation effort  Asthma-COPD overlap syndrome (HCC) Chronic respiratory failure on Oxygen .  Continue inhalers.    Encounter for antineoplastic chemotherapy Chemotherapy plan as listed above.   Hypocalcemia Continue calcium  1200mg  and Vitamin D  supplementation.  Calcium  has improved.   Macrocytosis He drinks alcohol.  Recommend empiric B12 1000mcg supplementation.   Orders Placed This Encounter  Procedures   CT CHEST ABDOMEN PELVIS W CONTRAST    Standing Status:   Future    Expected Date:   06/12/2024    Expiration Date:   05/22/2025    If indicated for the ordered procedure, I authorize the administration of contrast media per Radiology protocol:   Yes    Does the patient have a contrast media/X-ray dye allergy?:   No    Preferred imaging location?:   [facility]    If indicated for the ordered procedure, I authorize the administration of oral contrast media per Radiology protocol:   Yes    CBC with Differential (Cancer Center Only)    Standing Status:   Future    Expected Date:   06/19/2024    Expiration Date:   06/19/2025   CMP (Cancer Center only)    Standing Status:   Future    Expected Date:   06/19/2024    Expiration Date:   06/19/2025   CBC with Differential (Cancer Center Only)    Standing Status:   Future    Expected Date:   07/03/2024    Expiration Date:   07/03/2025   CMP (Cancer Center only)    Standing Status:   Future    Expected Date:   07/03/2024    Expiration Date:   07/03/2025   Follow up in 2 weeks  All questions were answered. The patient knows to call the clinic with any problems, questions or concerns.  Zelphia Cap, MD, PhD [facility] Hematology Oncology 05/22/2024    HISTORY OF PRESENTING ILLNESS:  Austin Patterson 68 y.o. male presents to establish care for Stage IV lung adenocarcinoma.  Patient previously followed up with Dr. Clista who left practice. He establish care with on 03/02/2024  I have reviewed his chart and materials related to his cancer extensively and collaborated history with the patient. Summary of oncologic history is as follows: Oncology History  Stage IV adenocarcinoma of lung (HCC)  04/19/2023 Imaging   PET scan showed 1. 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 2. Right lower lobe hypermetabolic pulmonary nodule, favoring synchronous primary over pulmonary metastasis. 3. Incidental findings,  including: Aortic atherosclerosis (ICD10-I70.0), coronary artery atherosclerosis and emphysema (ICD10-J43.9). Prostatomegaly. Sinus disease.   05/18/2023 Initial Diagnosis   Sarcomatoid carcinoma of lung (HCC)   05/20/2023 Cancer Staging   Staging form: Lung, AJCC 8th Edition - Clinical: Stage IV (cT4, cN3, cM1) - Signed by Agrawal, Kavita, MD on 05/20/2023 Stage prefix: Initial diagnosis   06/01/2023 - 11/08/2023 Chemotherapy    Patient is on Treatment Plan : LUNG NSCLC Carboplatin  (6) + Paclitaxel  (200) + Pembrolizumab  (200) D1 q21d x 4 cycles / Pembrolizumab  (200) Maintenance D1 q21d     08/30/2023 Imaging   CT chest with contrast improving right middle lobe mass, corresponding to the patient's known primary bronchogenic carcinoma. Small mediastinal and right perihilar nodal metastases are also improving.   Associated bilateral lower lobe pulmonary metastases, overall improved, although one right lower lobe nodular opacity is mildly progressive. Attention on follow-up is suggested.   Stable bilateral adrenal adenomas.   Aortic Atherosclerosis (ICD10-I70.0) and Emphysema (ICD10-J43.9).   11/26/2023 Imaging   CT chest abdomen pelvis with contrast 1. Interval progression of disease. The dominant mass within the right middle lobe has increased in size in the interval. There is also been significant increase in size of left lower lobe metastasis. Stable to mild increase in size of metastasis to the posterior right lower lobe. 2. Stable appearance of mediastinal and right hilar lymph nodes. 3. No signs of metastatic disease within the abdomen or pelvis. 4. Aortic Atherosclerosis (ICD10-I70.0) and Emphysema (ICD10-J43.9).   12/14/2023 Imaging   PET scan showed 1. Disease progression as evidenced by hypermetabolic nodules and masses in the lungs bilaterally as well as hypermetabolic metastatic mediastinal, hilar and periportal lymph nodes. 2. Bilateral adrenal adenomas. 3. Gastric wall thickening. 4. Aortic atherosclerosis (ICD10-I70.0). Coronary artery calcification. 5.  Emphysema (ICD10-J43.9)   01/06/2024 - 03/02/2024 Chemotherapy   Patient is on Treatment Plan : LUNG Carboplatin  + Paclitaxel  + XRT q7d     03/17/2024 Imaging   CT chest abdomen pelvis w contrast showed Overall slightly improved from previous examination. The large lung masses involving the middle lobe and left lower lobe appears slightly  smaller today. Lymph nodes in the mediastinum, right hilum and upper abdomen are also slightly smaller. No new mass lesion, lymph node enlargement identified. Extensive vascular calcifications with some areas of stenosis particularly the common femoral arteries bilaterally. Please correlate with symptoms. Fatty liver infiltration.   Advanced emphysematous lung changes.   One new small right-sided lung 4 mm nodule identified. Recommend attention on follow-up.     03/23/2024 -  Chemotherapy   Patient is on Treatment Plan : LUNG Gemcitabine  (1000) D1,8 q21d      He reports feeling ok today.  + increased SOB to his baseline level. Chronic respiratory failure, slightly more SOB. On O2 2L  No nausea vomiting diarrhea.  He cuts back on alcohol use.   MEDICAL HISTORY:  Past Medical History:  Diagnosis Date   Abdominal pain 12/07/2022   Acute urinary retention 12/07/2022   Chest pain 01/24/2022   COPD (chronic obstructive pulmonary disease) (HCC)    GERD (gastroesophageal reflux disease)    Hemothorax on right 05/11/2023   Hyperlipidemia    Hypertension    Pleuritic pain 05/10/2023   Pneumothorax after biopsy 05/11/2023   Post procedure discomfort 05/13/2023   Pulmonary fibrosis (HCC) 11/2015    SURGICAL HISTORY: Past Surgical History:  Procedure Laterality Date   COLONOSCOPY     CORONARY STENT PLACEMENT     ESOPHAGOGASTRODUODENOSCOPY (EGD) WITH PROPOFOL   N/A 09/23/2016   Procedure: ESOPHAGOGASTRODUODENOSCOPY (EGD) WITH PROPOFOL ;  Surgeon: Ruel Kung, MD;  Location: [facility] END[facility];  Service: Endoscopy;  Laterality: N/A;   IR IMAGING GUIDED PORT INSERTION  05/26/2023   IR IMAGING GUIDED PORT INSERTION  07/30/2023   IR RADIOLOGIST EVAL & MGMT  07/20/2023   KYPHOPLASTY N/A 03/14/2020   Procedure: T7 & T11 KYPHOPLASTY;  Surgeon: Kathlynn Sharper, MD;  Location: [facility] ORS;  Service: Orthopedics;  Laterality: N/A;   PORT-A-CATH REMOVAL N/A 06/14/2023   Procedure: REMOVAL PORT-A-CATH;  Surgeon:  Tye Millet, DO;  Location: [facility] ORS;  Service: General;  Laterality: N/A;   SHOULDER ACROMIOPLASTY      SOCIAL HISTORY: Social History   Socioeconomic History   Marital status: Divorced    Spouse name: Not on file   Number of children: Not on file   Years of education: Not on file   Highest education level: Not on file  Occupational History   Not on file  Tobacco Use   Smoking status: Former    Current packs/day: 0.00    Average packs/day: 2.0 packs/day for 50.0 years (100.0 ttl pk-yrs)    Types: Cigarettes    Start date: 10/13/1965    Quit date: 10/14/2015    Years since quitting: 8.6   Smokeless tobacco: Former  Building services engineer status: Never Used  Substance and Sexual Activity   Alcohol use: Yes    Alcohol/week: 56.0 standard drinks of alcohol    Types: 56 Cans of beer per week    Comment: I sit around and drink beer, that's all I got to do   Drug use: No   Sexual activity: Not Currently  Other Topics Concern   Not on file  Social History Narrative   Not on file   Social Drivers of Health   Financial Resource Strain: Not on file  Food Insecurity: No Food Insecurity (12/29/2023)   Hunger Vital Sign    Worried About Running Out of Food in the Last Year: Never true    Ran Out of Food in the Last Year: Never true  Transportation Needs: Unmet Transportation Needs (01/27/2024)   PRAPARE - Administrator, Civil Service (Medical): Yes    Lack of Transportation (Non-Medical): Yes  Physical Activity: Not on file  Stress: Not on file  Social Connections: Socially Isolated (12/29/2023)   Social Connection and Isolation Panel    Frequency of Communication with Friends and Family: Three times a week    Frequency of Social Gatherings with Friends and Family: Once a week    Attends Religious Services: Never    Database administrator or Organizations: No    Attends Banker Meetings: Never    Marital Status: Divorced  Catering manager Violence:  Not At Risk (12/29/2023)   Humiliation, Afraid, Rape, and Kick questionnaire    Fear of Current or Ex-Partner: No    Emotionally Abused: No    Physically Abused: No    Sexually Abused: No    FAMILY HISTORY: Family History  Problem Relation Age of Onset   Heart disease Mother     ALLERGIES:  is allergic to amoxicillin  and tizanidine .  MEDICATIONS:  Current Outpatient Medications  Medication Sig Dispense Refill   acetaminophen  (TYLENOL ) 500 MG tablet Take 1,000 mg by mouth every 6 (six) hours as needed for moderate pain or headache.     albuterol  (VENTOLIN  HFA) 108 (90 Base) MCG/ACT inhaler Inhale 2 puffs into the lungs every 6 (  six) hours as needed for wheezing or shortness of breath. 8 g 11   amLODipine  (NORVASC ) 5 MG tablet Take 1 tablet (5 mg total) by mouth at bedtime. Skip the dose if systolic BP less than 130 mmHg 30 tablet 5   aspirin  EC 81 MG tablet Take 1 tablet (81 mg total) by mouth daily. 90 tablet 3   atorvastatin  (LIPITOR) 40 MG tablet Take 1 tablet (40 mg total) by mouth at bedtime. 90 tablet 3   CALCIUM  600/VITAMIN D  600-10 MG-MCG TABS Take 1 tablet by mouth 2 (two) times daily.     clopidogrel  (PLAVIX ) 75 MG tablet Take 1 tablet (75 mg total) by mouth daily. 90 tablet 3   Dupilumab  (DUPIXENT ) 300 MG/2ML SOPN Inject 300 mg into the skin every 14 (fourteen) days. 12 mL 1   EPINEPHrine  0.3 mg/0.3 mL IJ SOAJ injection Inject 0.3 mg into the muscle as needed for anaphylaxis.     ferrous sulfate  325 (65 FE) MG tablet Take 1 tablet (325 mg total) by mouth 2 (two) times daily with a meal. 60 tablet 2   gabapentin  (NEURONTIN ) 300 MG capsule TAKE 1 CAPSULE BY MOUTH THREE TIMES A DAY 90 capsule 1   guaiFENesin -dextromethorphan  (ROBITUSSIN DM) 100-10 MG/5ML syrup Take 10 mLs by mouth every 6 (six) hours as needed for cough. 118 mL 1   ipratropium-albuterol  (DUONEB) 0.5-2.5 (3) MG/3ML SOLN INHALE 1 VIAL THROUGH NEBULIZER EVERY 6 HOURS 270 mL 2   lidocaine -prilocaine  (EMLA ) cream  Apply to affected area once 30 g 5   magnesium  oxide (MAG-OX) 400 (241.3 Mg) MG tablet Take 1 tablet (400 mg total) by mouth daily. 30 tablet 0   metoprolol  succinate (TOPROL -XL) 50 MG 24 hr tablet Take 50 mg by mouth daily.     montelukast  (SINGULAIR ) 10 MG tablet Take 10 mg by mouth daily.     Multiple Vitamin (MULTIVITAMIN WITH MINERALS) TABS tablet Take 1 tablet by mouth daily. 30 tablet 0   omega-3 acid ethyl esters (LOVAZA) 1 g capsule Take 1 g by mouth daily.     pantoprazole  (PROTONIX ) 40 MG tablet Take 1 tablet (40 mg total) by mouth 2 (two) times daily. 90 tablet 3   potassium chloride  SA (KLOR-CON  M) 20 MEQ tablet Take 20 mEq by mouth daily.     predniSONE  (DELTASONE ) 10 MG tablet TAKE 1 TABLET (10 MG TOTAL) BY MOUTH DAILY AS NEEDED. TAKE AS DIRECTED. 30 tablet 4   Spacer/Aero-Holding Chambers (AEROCHAMBER MV) inhaler Use as instructed 1 each 0   oxyCODONE  (OXY IR/ROXICODONE ) 5 MG immediate release tablet Take 1 tablet (5 mg total) by mouth every 8 (eight) hours as needed for severe pain (pain score 7-10). 90 tablet 0   sucralfate  (CARAFATE ) 1 g tablet Take 1 tablet (1 g total) by mouth 3 (three) times daily before meals. Dissolve in 4tbs warm water , swish and swallow (Patient not taking: Reported on 05/22/2024) 90 tablet 4   No current facility-administered medications for this visit.   Facility-Administered Medications Ordered in Other Visits  Medication Dose Route Frequency Provider Last Rate Last Admin   heparin  lock flush 100 unit/mL  500 Units Intravenous Once Otoniel Myhand, MD        Review of Systems  Constitutional:  Positive for fatigue. Negative for appetite change, chills and fever.  HENT:   Negative for hearing loss and voice change.   Eyes:  Negative for eye problems and icterus.  Respiratory:  Positive for shortness of breath. Negative for chest  tightness and cough.   Cardiovascular:  Negative for chest pain and leg swelling.  Gastrointestinal:  Negative for abdominal  distention and abdominal pain.  Endocrine: Negative for hot flashes.  Genitourinary:  Negative for difficulty urinating, dysuria and frequency.   Musculoskeletal:  Negative for arthralgias.  Skin:  Negative for itching and rash.  Neurological:  Negative for light-headedness and numbness.  Hematological:  Negative for adenopathy. Does not bruise/bleed easily.  Psychiatric/Behavioral:  Negative for confusion.      PHYSICAL EXAMINATION: ECOG PERFORMANCE STATUS: 2 - Symptomatic, <50% confined to bed  Vitals:   05/22/24 0908  BP: 132/85  Pulse: 88  Temp: (!) 97.4 F (36.3 C)  SpO2: 96%   Filed Weights   05/22/24 0908  Weight: 187 lb 11.2 oz (85.1 kg)    Physical Exam Constitutional:      General: He is not in acute distress.    Appearance: He is not diaphoretic.  HENT:     Head: Normocephalic and atraumatic.     Mouth/Throat:     Pharynx: No oropharyngeal exudate.  Eyes:     General: No scleral icterus.    Pupils: Pupils are equal, round, and reactive to light.  Cardiovascular:     Rate and Rhythm: Normal rate and regular rhythm.  Pulmonary:     Effort: Pulmonary effort is normal. No respiratory distress.     Comments: Decreased breath sounds bilaterally. On nasal cannula oxygen  Abdominal:     General: Bowel sounds are normal. There is no distension.     Palpations: Abdomen is soft.  Musculoskeletal:        General: Normal range of motion.     Cervical back: Normal range of motion and neck supple.  Skin:    General: Skin is warm and dry.     Findings: No erythema.  Neurological:     Mental Status: He is alert and oriented to person, place, and time. Mental status is at baseline.     Motor: No abnormal muscle tone.  Psychiatric:        Mood and Affect: Affect normal.      LABORATORY DATA:  I have reviewed the data as listed    Latest Ref Rng & Units 05/22/2024    8:55 AM 05/08/2024    8:24 AM 04/24/2024    8:43 AM  CBC  WBC 4.0 - 10.5 K/uL 10.2  9.5  6.4    Hemoglobin 13.0 - 17.0 g/dL 89.0  89.4  89.9   Hematocrit 39.0 - 52.0 % 33.9  32.1  30.8   Platelets 150 - 400 K/uL 312  306  211       Latest Ref Rng & Units 05/22/2024    8:55 AM 05/08/2024    8:24 AM 04/24/2024    8:43 AM  CMP  Glucose 70 - 99 mg/dL 889  885  871   BUN 8 - 23 mg/dL 18  19  14    Creatinine 0.61 - 1.24 mg/dL 9.23  9.24  9.27   Sodium 135 - 145 mmol/L 132  133  135   Potassium 3.5 - 5.1 mmol/L 4.0  4.0  3.7   Chloride 98 - 111 mmol/L 96  100  99   CO2 22 - 32 mmol/L 27  26  27    Calcium  8.9 - 10.3 mg/dL 9.3  8.7  8.7   Total Protein 6.5 - 8.1 g/dL 6.9  6.9  6.6   Total Bilirubin 0.0 - 1.2 mg/dL 0.6  0.8  0.5   Alkaline Phos 38 - 126 U/L 104  89  88   AST 15 - 41 U/L 23  19  20    ALT 0 - 44 U/L 19  17  23       RADIOGRAPHIC STUDIES: I have personally reviewed the radiological images as listed and agreed with the findings in the report. No results found.

## 2024-05-22 NOTE — Assessment & Plan Note (Signed)
 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  s/p dose reduced carboplatin , Taxol  and Keytruda  on 06/01/2023 x 4 cycles, poor PS/hospitalization--> Keytruda  maintenance until Feb 2025 --> PET progression--> 01/05/2024 concurrent chemo RT --> May 2025 CT showed slight decrease of lung mass--> June 2025 Gemcitabine .  Currently on Gemcitabine   as systemic maintenance.  Labs are reviewed and discussed with patient. He tolerates treatments well.  Proceed with Cycle 3 D1 Gemcitabine   Repeat CT scan after cycle 3.

## 2024-05-29 ENCOUNTER — Other Ambulatory Visit

## 2024-05-29 ENCOUNTER — Encounter

## 2024-05-29 ENCOUNTER — Ambulatory Visit: Admitting: Oncology

## 2024-05-29 ENCOUNTER — Ambulatory Visit

## 2024-06-05 ENCOUNTER — Inpatient Hospital Stay

## 2024-06-05 ENCOUNTER — Other Ambulatory Visit: Payer: Self-pay

## 2024-06-05 ENCOUNTER — Encounter: Payer: Self-pay | Admitting: Pulmonary Disease

## 2024-06-05 ENCOUNTER — Inpatient Hospital Stay (HOSPITAL_BASED_OUTPATIENT_CLINIC_OR_DEPARTMENT_OTHER): Admitting: Oncology

## 2024-06-05 ENCOUNTER — Encounter: Payer: Self-pay | Admitting: Oncology

## 2024-06-05 ENCOUNTER — Ambulatory Visit (INDEPENDENT_AMBULATORY_CARE_PROVIDER_SITE_OTHER): Admitting: Pulmonary Disease

## 2024-06-05 VITALS — BP 138/78 | HR 84 | Temp 97.8°F | Ht 67.0 in | Wt 187.0 lb

## 2024-06-05 VITALS — BP 136/72 | HR 88 | Temp 96.0°F | Resp 18 | Wt 189.4 lb

## 2024-06-05 DIAGNOSIS — J9611 Chronic respiratory failure with hypoxia: Secondary | ICD-10-CM | POA: Diagnosis not present

## 2024-06-05 DIAGNOSIS — J440 Chronic obstructive pulmonary disease with acute lower respiratory infection: Secondary | ICD-10-CM | POA: Diagnosis not present

## 2024-06-05 DIAGNOSIS — J4550 Severe persistent asthma, uncomplicated: Secondary | ICD-10-CM | POA: Diagnosis not present

## 2024-06-05 DIAGNOSIS — J4489 Other specified chronic obstructive pulmonary disease: Secondary | ICD-10-CM

## 2024-06-05 DIAGNOSIS — C3491 Malignant neoplasm of unspecified part of right bronchus or lung: Secondary | ICD-10-CM | POA: Diagnosis not present

## 2024-06-05 DIAGNOSIS — Z87891 Personal history of nicotine dependence: Secondary | ICD-10-CM

## 2024-06-05 DIAGNOSIS — Z5111 Encounter for antineoplastic chemotherapy: Secondary | ICD-10-CM | POA: Diagnosis not present

## 2024-06-05 DIAGNOSIS — J449 Chronic obstructive pulmonary disease, unspecified: Secondary | ICD-10-CM

## 2024-06-05 DIAGNOSIS — D7589 Other specified diseases of blood and blood-forming organs: Secondary | ICD-10-CM

## 2024-06-05 DIAGNOSIS — C3492 Malignant neoplasm of unspecified part of left bronchus or lung: Secondary | ICD-10-CM

## 2024-06-05 DIAGNOSIS — F1010 Alcohol abuse, uncomplicated: Secondary | ICD-10-CM

## 2024-06-05 DIAGNOSIS — J209 Acute bronchitis, unspecified: Secondary | ICD-10-CM

## 2024-06-05 DIAGNOSIS — E8801 Alpha-1-antitrypsin deficiency: Secondary | ICD-10-CM

## 2024-06-05 DIAGNOSIS — F1090 Alcohol use, unspecified, uncomplicated: Secondary | ICD-10-CM

## 2024-06-05 LAB — CMP (CANCER CENTER ONLY)
ALT: 17 U/L (ref 0–44)
AST: 18 U/L (ref 15–41)
Albumin: 3.8 g/dL (ref 3.5–5.0)
Alkaline Phosphatase: 91 U/L (ref 38–126)
Anion gap: 8 (ref 5–15)
BUN: 17 mg/dL (ref 8–23)
CO2: 27 mmol/L (ref 22–32)
Calcium: 9 mg/dL (ref 8.9–10.3)
Chloride: 101 mmol/L (ref 98–111)
Creatinine: 0.7 mg/dL (ref 0.61–1.24)
GFR, Estimated: >60 mL/min (ref >60)
Glucose, Bld: 117 mg/dL — ABNORMAL HIGH (ref 70–99)
Potassium: 4 mmol/L (ref 3.5–5.1)
Sodium: 136 mmol/L (ref 135–145)
Total Bilirubin: 0.6 mg/dL (ref 0.0–1.2)
Total Protein: 6.6 g/dL (ref 6.5–8.1)

## 2024-06-05 LAB — CBC WITH DIFFERENTIAL (CANCER CENTER ONLY)
Abs Immature Granulocytes: 0.04 K/uL (ref 0.00–0.07)
Basophils Absolute: 0 K/uL (ref 0.0–0.1)
Basophils Relative: 0 %
Eosinophils Absolute: 0.1 K/uL (ref 0.0–0.5)
Eosinophils Relative: 1 %
HCT: 34.1 % — ABNORMAL LOW (ref 39.0–52.0)
Hemoglobin: 10.7 g/dL — ABNORMAL LOW (ref 13.0–17.0)
Immature Granulocytes: 0 %
Lymphocytes Relative: 7 %
Lymphs Abs: 0.6 K/uL — ABNORMAL LOW (ref 0.7–4.0)
MCH: 32.1 pg (ref 26.0–34.0)
MCHC: 31.4 g/dL (ref 30.0–36.0)
MCV: 102.4 fL — ABNORMAL HIGH (ref 80.0–100.0)
Monocytes Absolute: 1 K/uL (ref 0.1–1.0)
Monocytes Relative: 11 %
Neutro Abs: 7.3 K/uL (ref 1.7–7.7)
Neutrophils Relative %: 81 %
Platelet Count: 251 K/uL (ref 150–400)
RBC: 3.33 MIL/uL — ABNORMAL LOW (ref 4.22–5.81)
RDW: 16.4 % — ABNORMAL HIGH (ref 11.5–15.5)
WBC Count: 9 K/uL (ref 4.0–10.5)
nRBC: 0 % (ref 0.0–0.2)

## 2024-06-05 MED ORDER — GUAIFENESIN-CODEINE 100-10 MG/5ML PO SOLN: 10.0000 mL | Freq: Three times a day (TID) | ORAL | 0 refills | Status: DC | PRN

## 2024-06-05 MED ORDER — AZITHROMYCIN 500 MG PO TABS: 500.0000 mg | ORAL_TABLET | Freq: Every day | ORAL | 0 refills | Status: DC

## 2024-06-05 MED ORDER — SODIUM CHLORIDE 0.9 % IV SOLN
1000.0000 mg/m2 | Freq: Once | INTRAVENOUS | Status: AC
  Administered 2024-06-05: 1976 mg via INTRAVENOUS
  Filled 2024-06-05: qty 51.97

## 2024-06-05 MED ORDER — PROCHLORPERAZINE MALEATE 10 MG PO TABS
10.0000 mg | ORAL_TABLET | Freq: Once | ORAL | Status: AC
  Administered 2024-06-05: 10 mg via ORAL
  Filled 2024-06-05: qty 1

## 2024-06-05 MED ORDER — SODIUM CHLORIDE 0.9 % IV SOLN
INTRAVENOUS | Status: DC
  Filled 2024-06-05: qty 250

## 2024-06-05 MED ORDER — DUPIXENT 300 MG/2ML ~~LOC~~ SOAJ: 2.0000 mL | SUBCUTANEOUS | 1 refills | Status: DC

## 2024-06-05 NOTE — Assessment & Plan Note (Signed)
 He drinks alcohol.  Recommend empiric B12 1000mcg supplementation.

## 2024-06-05 NOTE — Assessment & Plan Note (Signed)
 Chronic respiratory failure on Oxygen .  Continue inhalers.  Follow up with Dr. Tamea

## 2024-06-05 NOTE — Assessment & Plan Note (Signed)
 Continue calcium  1200mg  and Vitamin D  supplementation.  Calcium  has improved.

## 2024-06-05 NOTE — Progress Notes (Signed)
 Hematology/Oncology Progress note [phone] [phone]        CHIEF COMPLAINTS/PURPOSE OF CONSULTATION:  Stage IV lung adenocarcinoma.   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  s/p dose reduced carboplatin , Taxol  and Keytruda  on 06/01/2023 x 4 cycles, poor PS/hospitalization--> Keytruda  maintenance until Feb 2025 --> PET progression--> 01/05/2024 concurrent chemo RT --> May 2025 CT showed slight decrease of lung mass--> June 2025 Gemcitabine .  Currently on Gemcitabine   as systemic maintenance.  Labs are reviewed and discussed with patient. He tolerates treatments well.  Proceed with Cycle 3 D15 Gemcitabine   Repeat CT scan     Alcohol use Alcohol cessation is discussed with patient. Encourage cessation effort  Asthma-COPD overlap syndrome (HCC) Chronic respiratory failure on Oxygen .  Continue inhalers.  Follow up with Dr. Tamea  Encounter for antineoplastic chemotherapy Chemotherapy plan as listed above.   Hypocalcemia Continue calcium  1200mg  and Vitamin D  supplementation.  Calcium  has improved.   Macrocytosis He drinks alcohol.  Recommend empiric B12 1000mcg supplementation.   No orders of the defined types were placed in this encounter.  Follow up in 2 weeks  All questions were answered. The patient knows to call the clinic with any problems, questions or concerns.  Zelphia Cap, MD, PhD [facility] Hematology Oncology 06/05/2024    HISTORY OF PRESENTING ILLNESS:  Austin Patterson 68 y.o. male presents to establish care for Stage IV lung adenocarcinoma.  Patient previously followed up with Dr. Clista who left practice. He establish care with on 03/02/2024  I have reviewed his chart and  materials related to his cancer extensively and collaborated history with the patient. Summary of oncologic history is as follows: Oncology History  Stage IV adenocarcinoma of lung (HCC)  04/19/2023 Imaging   PET scan showed 1. 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 2. Right lower lobe hypermetabolic pulmonary nodule, favoring synchronous primary over pulmonary metastasis. 3. Incidental findings, including: Aortic atherosclerosis (ICD10-I70.0), coronary artery atherosclerosis and emphysema (ICD10-J43.9). Prostatomegaly. Sinus disease.   05/18/2023 Initial Diagnosis   Sarcomatoid carcinoma of lung (HCC)   05/20/2023 Cancer Staging   Staging form: Lung, AJCC 8th Edition - Clinical: Stage IV (cT4, cN3, cM1) - Signed by Agrawal, Kavita, MD on 05/20/2023 Stage prefix: Initial diagnosis   06/01/2023 - 11/08/2023 Chemotherapy   Patient is on Treatment Plan : LUNG NSCLC Carboplatin  (6) + Paclitaxel  (200) + Pembrolizumab  (200) D1 q21d x 4 cycles / Pembrolizumab  (200) Maintenance D1 q21d     08/30/2023 Imaging   CT chest with contrast improving right middle lobe mass, corresponding to the patient's known primary bronchogenic carcinoma. Small mediastinal and right perihilar nodal metastases are also improving.   Associated bilateral lower lobe pulmonary metastases, overall improved, although one right lower lobe nodular opacity is mildly progressive. Attention on follow-up is suggested.   Stable bilateral adrenal adenomas.   Aortic Atherosclerosis (ICD10-I70.0) and Emphysema (ICD10-J43.9).   11/26/2023 Imaging   CT chest abdomen pelvis with contrast 1. Interval progression of disease. The dominant mass within the right middle lobe has increased in size in the interval. There is also been significant increase in size of left lower lobe metastasis. Stable to mild increase in size  of metastasis  to the posterior right lower lobe. 2. Stable appearance of mediastinal and right hilar lymph nodes. 3. No signs of metastatic disease within the abdomen or pelvis. 4. Aortic Atherosclerosis (ICD10-I70.0) and Emphysema (ICD10-J43.9).   12/14/2023 Imaging   PET scan showed 1. Disease progression as evidenced by hypermetabolic nodules and masses in the lungs bilaterally as well as hypermetabolic metastatic mediastinal, hilar and periportal lymph nodes. 2. Bilateral adrenal adenomas. 3. Gastric wall thickening. 4. Aortic atherosclerosis (ICD10-I70.0). Coronary artery calcification. 5.  Emphysema (ICD10-J43.9)   01/06/2024 - 03/02/2024 Chemotherapy   Patient is on Treatment Plan : LUNG Carboplatin  + Paclitaxel  + XRT q7d     03/17/2024 Imaging   CT chest abdomen pelvis w contrast showed Overall slightly improved from previous examination. The large lung masses involving the middle lobe and left lower lobe appears slightly smaller today. Lymph nodes in the mediastinum, right hilum and upper abdomen are also slightly smaller. No new mass lesion, lymph node enlargement identified. Extensive vascular calcifications with some areas of stenosis particularly the common femoral arteries bilaterally. Please correlate with symptoms. Fatty liver infiltration.   Advanced emphysematous lung changes.   One new small right-sided lung 4 mm nodule identified. Recommend attention on follow-up.     03/23/2024 -  Chemotherapy   Patient is on Treatment Plan : LUNG Gemcitabine  (1000) D1,8 q21d      He reports feeling ok today.  + increased SOB to his baseline level. Chronic respiratory failure, slightly more SOB with exertion.. On O2 2L  No nausea vomiting diarrhea.  He cuts back on alcohol use.   MEDICAL HISTORY:  Past Medical History:  Diagnosis Date   Abdominal pain 12/07/2022   Acute urinary retention 12/07/2022   Chest pain 01/24/2022   COPD (chronic obstructive pulmonary disease)  (HCC)    GERD (gastroesophageal reflux disease)    Hemothorax on right 05/11/2023   Hyperlipidemia    Hypertension    Pleuritic pain 05/10/2023   Pneumothorax after biopsy 05/11/2023   Post procedure discomfort 05/13/2023   Pulmonary fibrosis (HCC) 11/2015    SURGICAL HISTORY: Past Surgical History:  Procedure Laterality Date   COLONOSCOPY     CORONARY STENT PLACEMENT     ESOPHAGOGASTRODUODENOSCOPY (EGD) WITH PROPOFOL  N/A 09/23/2016   Procedure: ESOPHAGOGASTRODUODENOSCOPY (EGD) WITH PROPOFOL ;  Surgeon: Ruel Kung, MD;  Location: [facility] END[facility];  Service: Endoscopy;  Laterality: N/A;   IR IMAGING GUIDED PORT INSERTION  05/26/2023   IR IMAGING GUIDED PORT INSERTION  07/30/2023   IR RADIOLOGIST EVAL & MGMT  07/20/2023   KYPHOPLASTY N/A 03/14/2020   Procedure: T7 & T11 KYPHOPLASTY;  Surgeon: Kathlynn Sharper, MD;  Location: [facility] ORS;  Service: Orthopedics;  Laterality: N/A;   PORT-A-CATH REMOVAL N/A 06/14/2023   Procedure: REMOVAL PORT-A-CATH;  Surgeon: Tye Millet, DO;  Location: [facility] ORS;  Service: General;  Laterality: N/A;   SHOULDER ACROMIOPLASTY      SOCIAL HISTORY: Social History   Socioeconomic History   Marital status: Divorced    Spouse name: Not on file   Number of children: Not on file   Years of education: Not on file   Highest education level: Not on file  Occupational History   Not on file  Tobacco Use   Smoking status: Former    Current packs/day: 0.00    Average packs/day: 2.0 packs/day for 50.0 years (100.0 ttl pk-yrs)    Types: Cigarettes    Start date: 10/13/1965    Quit date: 10/14/2015    Years since quitting: 8.6  Smokeless tobacco: Former  Building services engineer status: Never Used  Substance and Sexual Activity   Alcohol use: Yes    Alcohol/week: 56.0 standard drinks of alcohol    Types: 56 Cans of beer per week    Comment: I sit around and drink beer, that's all I got to do   Drug use: No   Sexual activity: Not Currently  Other Topics Concern    Not on file  Social History Narrative   Not on file   Social Drivers of Health   Financial Resource Strain: Not on file  Food Insecurity: No Food Insecurity (12/29/2023)   Hunger Vital Sign    Worried About Running Out of Food in the Last Year: Never true    Ran Out of Food in the Last Year: Never true  Transportation Needs: Unmet Transportation Needs (01/27/2024)   PRAPARE - Administrator, Civil Service (Medical): Yes    Lack of Transportation (Non-Medical): Yes  Physical Activity: Not on file  Stress: Not on file  Social Connections: Socially Isolated (12/29/2023)   Social Connection and Isolation Panel    Frequency of Communication with Friends and Family: Three times a week    Frequency of Social Gatherings with Friends and Family: Once a week    Attends Religious Services: Never    Database administrator or Organizations: No    Attends Banker Meetings: Never    Marital Status: Divorced  Catering manager Violence: Not At Risk (12/29/2023)   Humiliation, Afraid, Rape, and Kick questionnaire    Fear of Current or Ex-Partner: No    Emotionally Abused: No    Physically Abused: No    Sexually Abused: No    FAMILY HISTORY: Family History  Problem Relation Age of Onset   Heart disease Mother     ALLERGIES:  is allergic to amoxicillin  and tizanidine .  MEDICATIONS:  Current Outpatient Medications  Medication Sig Dispense Refill   acetaminophen  (TYLENOL ) 500 MG tablet Take 1,000 mg by mouth every 6 (six) hours as needed for moderate pain or headache.     albuterol  (VENTOLIN  HFA) 108 (90 Base) MCG/ACT inhaler Inhale 2 puffs into the lungs every 6 (six) hours as needed for wheezing or shortness of breath. 8 g 11   amLODipine  (NORVASC ) 5 MG tablet Take 1 tablet (5 mg total) by mouth at bedtime. Skip the dose if systolic BP less than 130 mmHg 30 tablet 5   aspirin  EC 81 MG tablet Take 1 tablet (81 mg total) by mouth daily. 90 tablet 3   atorvastatin   (LIPITOR) 40 MG tablet Take 1 tablet (40 mg total) by mouth at bedtime. 90 tablet 3   CALCIUM  600/VITAMIN D  600-10 MG-MCG TABS Take 1 tablet by mouth 2 (two) times daily.     clopidogrel  (PLAVIX ) 75 MG tablet Take 1 tablet (75 mg total) by mouth daily. 90 tablet 3   EPINEPHrine  0.3 mg/0.3 mL IJ SOAJ injection Inject 0.3 mg into the muscle as needed for anaphylaxis.     ferrous sulfate  325 (65 FE) MG tablet Take 1 tablet (325 mg total) by mouth 2 (two) times daily with a meal. 60 tablet 2   gabapentin  (NEURONTIN ) 300 MG capsule TAKE 1 CAPSULE BY MOUTH THREE TIMES A DAY 90 capsule 1   ipratropium-albuterol  (DUONEB) 0.5-2.5 (3) MG/3ML SOLN INHALE 1 VIAL THROUGH NEBULIZER EVERY 6 HOURS 270 mL 2   lidocaine -prilocaine  (EMLA ) cream Apply to affected area once 30 g 5  magnesium  oxide (MAG-OX) 400 (241.3 Mg) MG tablet Take 1 tablet (400 mg total) by mouth daily. 30 tablet 0   metoprolol  succinate (TOPROL -XL) 50 MG 24 hr tablet Take 50 mg by mouth daily.     montelukast  (SINGULAIR ) 10 MG tablet Take 10 mg by mouth daily.     Multiple Vitamin (MULTIVITAMIN WITH MINERALS) TABS tablet Take 1 tablet by mouth daily. 30 tablet 0   omega-3 acid ethyl esters (LOVAZA) 1 g capsule Take 1 g by mouth daily.     oxyCODONE  (OXY IR/ROXICODONE ) 5 MG immediate release tablet Take 1 tablet (5 mg total) by mouth every 8 (eight) hours as needed for severe pain (pain score 7-10). 90 tablet 0   pantoprazole  (PROTONIX ) 40 MG tablet Take 1 tablet (40 mg total) by mouth 2 (two) times daily. 90 tablet 3   potassium chloride  SA (KLOR-CON  M) 20 MEQ tablet Take 20 mEq by mouth daily.     predniSONE  (DELTASONE ) 10 MG tablet TAKE 1 TABLET (10 MG TOTAL) BY MOUTH DAILY AS NEEDED. TAKE AS DIRECTED. 30 tablet 4   Spacer/Aero-Holding Chambers (AEROCHAMBER MV) inhaler Use as instructed 1 each 0   azithromycin  (ZITHROMAX ) 500 MG tablet Take 1 tablet (500 mg total) by mouth daily for 3 days. Take 1 tablet daily for 3 days. 3 tablet 0    Dupilumab  (DUPIXENT ) 300 MG/2ML SOAJ Inject 300 mg into the skin every 14 (fourteen) days. 12 mL 1   guaiFENesin -codeine  100-10 MG/5ML syrup Take 10 mLs by mouth 3 (three) times daily as needed for cough. 120 mL 0   sucralfate  (CARAFATE ) 1 g tablet Take 1 tablet (1 g total) by mouth 3 (three) times daily before meals. Dissolve in 4tbs warm water , swish and swallow (Patient not taking: Reported on 06/05/2024) 90 tablet 4   No current facility-administered medications for this visit.   Facility-Administered Medications Ordered in Other Visits  Medication Dose Route Frequency Provider Last Rate Last Admin   heparin  lock flush 100 unit/mL  500 Units Intravenous Once Kenyada Hy, MD        Review of Systems  Constitutional:  Positive for fatigue. Negative for appetite change, chills and fever.  HENT:   Negative for hearing loss and voice change.   Eyes:  Negative for eye problems and icterus.  Respiratory:  Positive for shortness of breath. Negative for chest tightness and cough.   Cardiovascular:  Negative for chest pain and leg swelling.  Gastrointestinal:  Negative for abdominal distention and abdominal pain.  Endocrine: Negative for hot flashes.  Genitourinary:  Negative for difficulty urinating, dysuria and frequency.   Musculoskeletal:  Negative for arthralgias.  Skin:  Negative for itching and rash.  Neurological:  Negative for light-headedness and numbness.  Hematological:  Negative for adenopathy. Does not bruise/bleed easily.  Psychiatric/Behavioral:  Negative for confusion.      PHYSICAL EXAMINATION: ECOG PERFORMANCE STATUS: 2 - Symptomatic, <50% confined to bed  Vitals:   06/05/24 0910  BP: 136/72  Pulse: 88  Resp: 18  Temp: (!) 96 F (35.6 C)  SpO2: 96%   Filed Weights   06/05/24 0910  Weight: 189 lb 6.4 oz (85.9 kg)    Physical Exam Constitutional:      General: He is not in acute distress.    Appearance: He is not diaphoretic.  HENT:     Head: Normocephalic and  atraumatic.     Mouth/Throat:     Pharynx: No oropharyngeal exudate.  Eyes:     General:  No scleral icterus.    Pupils: Pupils are equal, round, and reactive to light.  Cardiovascular:     Rate and Rhythm: Normal rate and regular rhythm.  Pulmonary:     Effort: Pulmonary effort is normal. No respiratory distress.     Comments: Decreased breath sounds bilaterally. On nasal cannula oxygen  Abdominal:     General: Bowel sounds are normal. There is no distension.     Palpations: Abdomen is soft.  Musculoskeletal:        General: Normal range of motion.     Cervical back: Normal range of motion and neck supple.  Skin:    General: Skin is warm and dry.     Findings: No erythema.  Neurological:     Mental Status: He is alert and oriented to person, place, and time. Mental status is at baseline.     Motor: No abnormal muscle tone.  Psychiatric:        Mood and Affect: Affect normal.      LABORATORY DATA:  I have reviewed the data as listed    Latest Ref Rng & Units 06/05/2024    8:55 AM 05/22/2024    8:55 AM 05/08/2024    8:24 AM  CBC  WBC 4.0 - 10.5 K/uL 9.0  10.2  9.5   Hemoglobin 13.0 - 17.0 g/dL 89.2  89.0  89.4   Hematocrit 39.0 - 52.0 % 34.1  33.9  32.1   Platelets 150 - 400 K/uL 251  312  306       Latest Ref Rng & Units 06/05/2024    8:55 AM 05/22/2024    8:55 AM 05/08/2024    8:24 AM  CMP  Glucose 70 - 99 mg/dL 882  889  885   BUN 8 - 23 mg/dL 17  18  19    Creatinine 0.61 - 1.24 mg/dL 9.29  9.23  9.24   Sodium 135 - 145 mmol/L 136  132  133   Potassium 3.5 - 5.1 mmol/L 4.0  4.0  4.0   Chloride 98 - 111 mmol/L 101  96  100   CO2 22 - 32 mmol/L 27  27  26    Calcium  8.9 - 10.3 mg/dL 9.0  9.3  8.7   Total Protein 6.5 - 8.1 g/dL 6.6  6.9  6.9   Total Bilirubin 0.0 - 1.2 mg/dL 0.6  0.6  0.8   Alkaline Phos 38 - 126 U/L 91  104  89   AST 15 - 41 U/L 18  23  19    ALT 0 - 44 U/L 17  19  17       RADIOGRAPHIC STUDIES: I have personally reviewed the radiological images as  listed and agreed with the findings in the report. No results found.

## 2024-06-05 NOTE — Patient Instructions (Signed)
 [facility] - A DEPT OF [facility]  Discharge Instructions: Thank you for choosing [facility] to provide your oncology and hematology care.  If you have a lab appointment with the Cancer Center, please go directly to the Cancer Center and check in at the registration area.  Wear comfortable clothing and clothing appropriate for easy access to any Portacath or PICC line.   We strive to give you quality time with your provider. You may need to reschedule your appointment if you arrive late (15 or more minutes).  Arriving late affects you and other patients whose appointments are after yours.  Also, if you miss three or more appointments without notifying the office, you may be dismissed from the clinic at the provider's discretion.      For prescription refill requests, have your pharmacy contact our office and allow 72 hours for refills to be completed.    Today you received the following chemotherapy and/or immunotherapy agents Gemzar      To help prevent nausea and vomiting after your treatment, we encourage you to take your nausea medication as directed.  BELOW ARE SYMPTOMS THAT SHOULD BE REPORTED IMMEDIATELY: *FEVER GREATER THAN 100.4 F (38 C) OR HIGHER *CHILLS OR SWEATING *NAUSEA AND VOMITING THAT IS NOT CONTROLLED WITH YOUR NAUSEA MEDICATION *UNUSUAL SHORTNESS OF BREATH *UNUSUAL BRUISING OR BLEEDING *URINARY PROBLEMS (pain or burning when urinating, or frequent urination) *BOWEL PROBLEMS (unusual diarrhea, constipation, pain near the anus) TENDERNESS IN MOUTH AND THROAT WITH OR WITHOUT PRESENCE OF ULCERS (sore throat, sores in mouth, or a toothache) UNUSUAL RASH, SWELLING OR PAIN  UNUSUAL VAGINAL DISCHARGE OR ITCHING   Items with * indicate a potential emergency and should be followed up as soon as possible or go to the Emergency Department if any problems should occur.  Please show the CHEMOTHERAPY ALERT CARD or IMMUNOTHERAPY ALERT  CARD at check-in to the Emergency Department and triage nurse.  Should you have questions after your visit or need to cancel or reschedule your appointment, please contact [facility] - A DEPT OF [facility]  [phone] and follow the prompts.  Office hours are 8:00 a.m. to 4:30 p.m. Monday - Friday. Please note that voicemails left after 4:00 p.m. may not be returned until the following business day.  We are closed weekends and major holidays. You have access to a nurse at all times for urgent questions. Please call the main number to the clinic [phone] and follow the prompts.  For any non-urgent questions, you may also contact your provider using MyChart. We now offer e-Visits for anyone 60 and older to request care online for non-urgent symptoms. For details visit [email or web address].   Also download the MyChart app! Go to the app store, search "MyChart", open the app, select [place], and log in with your MyChart username and password.

## 2024-06-05 NOTE — Progress Notes (Signed)
 Subjective:    Patient ID: Austin Patterson, male    DOB: 07-16-1956, 68 y.o.   MRN: [id number]  Patient Care Team: Autry Grayce LABOR, PA as PCP - General (Physician Assistant) Darliss Rogue, MD as PCP - Cardiology (Cardiology) Tamea Dedra CROME, MD as Consulting Physician (Pulmonary Disease) Verdene Gills, RN as Oncology Nurse Navigator Lenn Aran, MD as Consulting Physician (Radiation Oncology) Babara Call, MD as Consulting Physician (Oncology)  Chief Complaint  Patient presents with   Follow-up    Cough, shortness of breath and wheezing.     BACKGROUND/INTERVAL:Austin Patterson is a very complex 68 year old former smoker (100 PY) with stage IV very severe COPD, severe persistent asthma and chronic respiratory failure with hypoxia who presents for follow-up of these issues.  I last saw him on 09 February 2024 and at that time he was well compensated from his respiratory issues.  At baseline the patient is very sedentary.  This is due to debilitating dyspnea.  He is compliant with oxygen  at 2 L/min 24/7.  He has a complex respiratory history with very severe asthma with IgE initially recorded at 1,950 this was noted to be directed to Aspergillus fumigatus (ABPA).  He is also a heterozygous alpha-1 antitrypsin MZ phenotype with normal alpha-1 levels.  He has been on Dupixent  for management of his asthma component.  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  Patient is now on gemcitabine  systemic maintenance.  HPI Discussed the use of AI scribe software for clinical note transcription with the patient, who gave verbal consent to proceed.  History of Present Illness   Austin Patterson is a 68 year old male with end stage COPD, chronic respiratory failure, and stage four adenocarcinoma who presents for follow-up.  His breathing has worsened slightly,  which he attributes to a general decline rather than the initiation of new chemotherapy in June. He continues to use Dupixent  injections, which he feels help stabilize his breathing after chemotherapy infusions. He is also using inhalers regularly and states he 'can't go without them.'  He has been experiencing a productive cough, particularly in the mornings, with sputum that is clear and yellow.  His lung cancer is present in both lungs. He mentions a family history of a similar rare cancer in an aunt.        Review of Systems A 10 point review of systems was performed and it is as noted above otherwise negative.   Patient Active Problem List   Diagnosis Date Noted   Thrombocytopenia (HCC) 03/23/2024   Anemia due to antineoplastic chemotherapy 01/27/2024   Encounter for antineoplastic chemotherapy 01/06/2024   Hemoptysis 12/29/2023   Hypocalcemia 09/06/2023   Overweight (BMI 25.0-29.9) 08/08/2023   SIRS (systemic inflammatory response syndrome) (HCC) 08/06/2023   Cancer related pain 07/05/2023   Paroxysmal atrial fibrillation (HCC) 06/16/2023   Sepsis (HCC) 06/12/2023   Macrocytosis 06/08/2023   Stage IV adenocarcinoma of lung (HCC) 05/18/2023   Swelling of right foot 05/11/2023   Heterozygous alpha 1-antitrypsin deficiency (HCC) 04/27/2023   Cough 04/03/2023   Alcohol use 04/03/2023   Constipation 12/10/2022   COPD exacerbation (HCC) 02/23/2022   GERD without esophagitis 02/23/2022   Alpha-1-antitrypsin deficiency carrier 01/10/2021   History of nonmelanoma skin cancer 12/19/2020   Asthma 03/12/2020   Chronic respiratory failure with hypoxia (HCC) 03/12/2020   Hyperlipidemia    Asthma-COPD overlap syndrome (HCC)  Bleeding nose    Alcohol abuse    RUQ abdominal pain    Anaphylaxis 11/01/2019   Hypotension 11/01/2019   CAD (coronary artery disease) 11/01/2019   H/O heart artery stent 11/01/2019   Acute on chronic respiratory failure with hypoxia (HCC)    RUQ pain     Essential hypertension    Pulmonary fibrosis (HCC) 08/30/2019   Hypomagnesemia 03/31/2017   Hiatal hernia    Reflux esophagitis    Hematemesis without nausea    Hyponatremia 09/21/2016   Multifocal pneumonia 12/09/2015    Social History   Tobacco Use   Smoking status: Former    Current packs/day: 0.00    Average packs/day: 2.0 packs/day for 50.0 years (100.0 ttl pk-yrs)    Types: Cigarettes    Start date: 10/13/1965    Quit date: 10/14/2015    Years since quitting: 8.6   Smokeless tobacco: Former  Substance Use Topics   Alcohol use: Yes    Alcohol/week: 56.0 standard drinks of alcohol    Types: 56 Cans of beer per week    Comment: I sit around and drink beer, that's all I got to do    Allergies  Allergen Reactions   Amoxicillin  Anaphylaxis   Tizanidine      Feet and ankle swell     Current Meds  Medication Sig   acetaminophen  (TYLENOL ) 500 MG tablet Take 1,000 mg by mouth every 6 (six) hours as needed for moderate pain or headache.   albuterol  (VENTOLIN  HFA) 108 (90 Base) MCG/ACT inhaler Inhale 2 puffs into the lungs every 6 (six) hours as needed for wheezing or shortness of breath.   amLODipine  (NORVASC ) 5 MG tablet Take 1 tablet (5 mg total) by mouth at bedtime. Skip the dose if systolic BP less than 130 mmHg   aspirin  EC 81 MG tablet Take 1 tablet (81 mg total) by mouth daily.   atorvastatin  (LIPITOR) 40 MG tablet Take 1 tablet (40 mg total) by mouth at bedtime.   azithromycin  (ZITHROMAX ) 500 MG tablet Take 1 tablet (500 mg total) by mouth daily for 3 days. Take 1 tablet daily for 3 days.   CALCIUM  600/VITAMIN D  600-10 MG-MCG TABS Take 1 tablet by mouth 2 (two) times daily.   clopidogrel  (PLAVIX ) 75 MG tablet Take 1 tablet (75 mg total) by mouth daily.   EPINEPHrine  0.3 mg/0.3 mL IJ SOAJ injection Inject 0.3 mg into the muscle as needed for anaphylaxis.   ferrous sulfate  325 (65 FE) MG tablet Take 1 tablet (325 mg total) by mouth 2 (two) times daily with a meal.    gabapentin  (NEURONTIN ) 300 MG capsule TAKE 1 CAPSULE BY MOUTH THREE TIMES A DAY   guaiFENesin -dextromethorphan  (ROBITUSSIN DM) 100-10 MG/5ML syrup Take 10 mLs by mouth every 6 (six) hours as needed for cough.   ipratropium-albuterol  (DUONEB) 0.5-2.5 (3) MG/3ML SOLN INHALE 1 VIAL THROUGH NEBULIZER EVERY 6 HOURS   lidocaine -prilocaine  (EMLA ) cream Apply to affected area once   magnesium  oxide (MAG-OX) 400 (241.3 Mg) MG tablet Take 1 tablet (400 mg total) by mouth daily.   metoprolol  succinate (TOPROL -XL) 50 MG 24 hr tablet Take 50 mg by mouth daily.   montelukast  (SINGULAIR ) 10 MG tablet Take 10 mg by mouth daily.   Multiple Vitamin (MULTIVITAMIN WITH MINERALS) TABS tablet Take 1 tablet by mouth daily.   omega-3 acid ethyl esters (LOVAZA) 1 g capsule Take 1 g by mouth daily.   oxyCODONE  (OXY IR/ROXICODONE ) 5 MG immediate release tablet Take 1 tablet (5  mg total) by mouth every 8 (eight) hours as needed for severe pain (pain score 7-10).   pantoprazole  (PROTONIX ) 40 MG tablet Take 1 tablet (40 mg total) by mouth 2 (two) times daily.   potassium chloride  SA (KLOR-CON  M) 20 MEQ tablet Take 20 mEq by mouth daily.   predniSONE  (DELTASONE ) 10 MG tablet TAKE 1 TABLET (10 MG TOTAL) BY MOUTH DAILY AS NEEDED. TAKE AS DIRECTED.   Spacer/Aero-Holding Chambers (AEROCHAMBER MV) inhaler Use as instructed   [DISCONTINUED] Dupilumab  (DUPIXENT ) 300 MG/2ML SOPN Inject 300 mg into the skin every 14 (fourteen) days.    Immunization History  Administered Date(s) Administered   Fluad Quad(high Dose 65+) 10/03/2023   Influenza Inj Mdck Quad Pf 08/10/2019   Influenza Inj Mdck Quad With Preservative 07/18/2018   Influenza,inj,Quad PF,6+ Mos 10/15/2015, 08/31/2016   Influenza-Unspecified 08/10/2019, 07/19/2020, 07/19/2022   Moderna SARS-COV2 Booster Vaccination 10/23/2020   Moderna Sars-Covid-2 Vaccination 11/21/2019, 12/21/2019   Pneumococcal Polysaccharide-23 10/15/2015        Objective:   BP 138/78 (BP  Location: Left Arm, Patient Position: Sitting, Cuff Size: Normal)   Pulse 84   Temp 97.8 F (36.6 C) (Oral)   Ht 5' 7 (1.702 m)   Wt 187 lb (84.8 kg)   SpO2 95%   BMI 29.29 kg/m   SpO2: 95 % O2 Device: Nasal cannula O2 Flow Rate (L/min): 2 L/min O2 Type: Continuous O2  GENERAL: Chronically ill appearing gentleman, chronic use of accessories, presents in transport chair.  Wearing oxygen  via nasal cannula.  No acute respiratory distress.  No plethora.  HEAD: Normocephalic, atraumatic. EYES: Pupils equal, round, reactive to light.  No scleral icterus. MOUTH: Teeth in poor repair, oral mucosa moist, no thrush. NECK: Supple. No thyromegaly. Trachea midline. No JVD.  No adenopathy. PULMONARY: Increased AP diameter, significant kyphosis. Distant breath sounds.  Faint dry crackles bilaterally.   CARDIOVASCULAR: S1 and S2. Regular rate and rhythm.  Distant heart tones no murmur appreciated. GASTROINTESTINAL: Benign. MUSCULOSKELETAL: No joint deformity, no clubbing, no edema. NEUROLOGIC: No overt focal deficit.  Speech is fluent.  Awake, alert. SKIN: Intact,warm,dry. PSYCH: Mood and behavior normal.      Assessment & Plan:     ICD-10-CM   1. Stage 4 very severe COPD by GOLD classification (HCC)  J44.9     2. Acute bronchitis with COPD (HCC)  J44.0    J20.9     3. Severe persistent asthma without complication  J45.50     4. Chronic respiratory failure with hypoxia (HCC)  J96.11     5. Heterozygous alpha 1-antitrypsin deficiency (HCC)  E88.01     6. Adenocarcinoma of right lung, stage 4 (HCC)  C34.91     7. Alcohol abuse  F10.10      Meds ordered this encounter  Medications   azithromycin  (ZITHROMAX ) 500 MG tablet    Sig: Take 1 tablet (500 mg total) by mouth daily for 3 days. Take 1 tablet daily for 3 days.    Dispense:  3 tablet    Refill:  0   guaiFENesin -codeine  100-10 MG/5ML syrup    Sig: Take 10 mLs by mouth 3 (three) times daily as needed for cough.    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  Patient is under treatment, but it is a rare and aggressive form that does  not fit the standard adenocarcinoma pattern. Currently on chemotherapy, with gemcitabine , which started in June.  Increased shortness of breath may be due to gemcitabine  pneumonitis versus progression of end-stage COPD. - Await results of CT scan scheduled for August 25 to assess response to treatment and further management. - Will review CT when available.  End stage chronic obstructive pulmonary disease (COPD) with chronic respiratory failure End stage COPD with chronic respiratory failure. Reports worsening breathing, which has been gradually declining. Dupixent  injections provide some relief after chemotherapy infusions. - Continue Dupixent  injections. - Continue current inhaler regimen.  Acute bronchitis and COPD Acute bronchitis suspected due to symptoms of cough with mucopurulent sputum. - Prescribe empirical antibiotics for three days to address cough (azithromycin  500 mg).      Advised if symptoms do not improve or worsen, to please contact office for sooner follow up or seek emergency care.    I spent 35 minutes of dedicated to the care of this patient on the date of this encounter to include pre-visit review of records, face-to-face time with the patient discussing conditions above, post visit ordering of testing, clinical documentation with the electronic health record, making appropriate referrals as documented, and communicating necessary findings to members of the patients care team.     C. Leita Sanders, MD Advanced Bronchoscopy PCCM [facility]    *This note was generated using voice recognition software/Dragon and/or AI transcription program.  Despite best efforts to proofread, errors can occur which can  change the meaning. Any transcriptional errors that result from this process are unintentional and may not be fully corrected at the time of dictation.

## 2024-06-05 NOTE — Telephone Encounter (Signed)
 Refill sent for DUPIXENT  to [facility]: [phone]  Dose: 300mg  subcut every 14 days  Last OV: 06/05/2024 Provider: Dr. Tamea Sherry Pennant, PharmD, MPH, BCPS Clinical Pharmacist (Rheumatology and Pulmonology)

## 2024-06-05 NOTE — Assessment & Plan Note (Signed)
 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  s/p dose reduced carboplatin , Taxol  and Keytruda  on 06/01/2023 x 4 cycles, poor PS/hospitalization--> Keytruda  maintenance until Feb 2025 --> PET progression--> 01/05/2024 concurrent chemo RT --> May 2025 CT showed slight decrease of lung mass--> June 2025 Gemcitabine .  Currently on Gemcitabine   as systemic maintenance.  Labs are reviewed and discussed with patient. He tolerates treatments well.  Proceed with Cycle 3 D15 Gemcitabine   Repeat CT scan

## 2024-06-05 NOTE — Patient Instructions (Signed)
 VISIT SUMMARY:  You came in today for a follow-up appointment to discuss your ongoing health issues, including COPD, lung cancer, and a recent productive cough.  YOUR PLAN:  -STAGE 4 ADENOCARCINOMA OF BOTH LUNGS WITH SARCOMATOID FEATURES: This is a rare and aggressive form of lung cancer that is present in both of your lungs. You are currently undergoing chemotherapy, which started in June. We are awaiting the results of your CT scan scheduled for August 25 to assess how well the treatment is working and to plan further management.  -END STAGE CHRONIC OBSTRUCTIVE PULMONARY DISEASE (COPD) WITH CHRONIC RESPIRATORY FAILURE: COPD is a chronic lung disease that makes it hard to breathe. Your condition is severe and has been gradually worsening. You should continue using your Dupixent  injections and your current inhaler regimen as they are helping to manage your symptoms.  -POSSIBLE PNEUMONIA: Pneumonia is an infection that inflames the air sacs in one or both lungs. Due to your symptoms of a productive cough with clear and yellow sputum, we suspect you might have pneumonia. We have ordered a chest x-ray to check for pneumonia and prescribed antibiotics for three days to help with your cough.  INSTRUCTIONS:  Please follow up after your CT scan on August 25 to discuss the results and next steps. Continue using your Dupixent  injections and inhalers as prescribed. Take the antibiotics for three days as directed and get the chest x-ray done to check for pneumonia.

## 2024-06-05 NOTE — Assessment & Plan Note (Signed)
 Alcohol cessation is discussed with patient. Encourage cessation effort

## 2024-06-05 NOTE — Assessment & Plan Note (Signed)
 Chemotherapy plan as listed above

## 2024-06-12 ENCOUNTER — Encounter

## 2024-06-12 ENCOUNTER — Ambulatory Visit
Admission: RE | Admit: 2024-06-12 | Discharge: 2024-06-12 | Disposition: A | Discharge status: Home / Self Care | Source: Ambulatory Visit | Attending: Oncology | Admitting: Oncology

## 2024-06-12 DIAGNOSIS — C3491 Malignant neoplasm of unspecified part of right bronchus or lung: Secondary | ICD-10-CM | POA: Diagnosis present

## 2024-06-12 MED ORDER — IOHEXOL 300 MG/ML  SOLN
100.0000 mL | Freq: Once | INTRAMUSCULAR | Status: AC | PRN
  Administered 2024-06-12: 100 mL via INTRAVENOUS

## 2024-06-20 ENCOUNTER — Inpatient Hospital Stay (HOSPITAL_BASED_OUTPATIENT_CLINIC_OR_DEPARTMENT_OTHER): Admitting: Oncology

## 2024-06-20 ENCOUNTER — Encounter: Payer: Self-pay | Admitting: Oncology

## 2024-06-20 ENCOUNTER — Inpatient Hospital Stay

## 2024-06-20 ENCOUNTER — Inpatient Hospital Stay: Attending: Oncology

## 2024-06-20 ENCOUNTER — Other Ambulatory Visit: Payer: Self-pay

## 2024-06-20 VITALS — BP 133/62 | HR 87

## 2024-06-20 VITALS — BP 138/78 | HR 89 | Temp 96.2°F | Resp 18 | Wt 185.4 lb

## 2024-06-20 DIAGNOSIS — J4489 Other specified chronic obstructive pulmonary disease: Secondary | ICD-10-CM | POA: Insufficient documentation

## 2024-06-20 DIAGNOSIS — Z712 Person consulting for explanation of examination or test findings: Secondary | ICD-10-CM | POA: Insufficient documentation

## 2024-06-20 DIAGNOSIS — Z79899 Other long term (current) drug therapy: Secondary | ICD-10-CM | POA: Insufficient documentation

## 2024-06-20 DIAGNOSIS — N400 Enlarged prostate without lower urinary tract symptoms: Secondary | ICD-10-CM | POA: Insufficient documentation

## 2024-06-20 DIAGNOSIS — C3491 Malignant neoplasm of unspecified part of right bronchus or lung: Secondary | ICD-10-CM

## 2024-06-20 DIAGNOSIS — Z87891 Personal history of nicotine dependence: Secondary | ICD-10-CM | POA: Diagnosis not present

## 2024-06-20 DIAGNOSIS — J90 Pleural effusion, not elsewhere classified: Secondary | ICD-10-CM | POA: Insufficient documentation

## 2024-06-20 DIAGNOSIS — Z7982 Long term (current) use of aspirin: Secondary | ICD-10-CM | POA: Insufficient documentation

## 2024-06-20 DIAGNOSIS — Z5111 Encounter for antineoplastic chemotherapy: Secondary | ICD-10-CM

## 2024-06-20 LAB — CMP (CANCER CENTER ONLY)
ALT: 14 U/L (ref 0–44)
AST: 20 U/L (ref 15–41)
Albumin: 4 g/dL (ref 3.5–5.0)
Alkaline Phosphatase: 113 U/L (ref 38–126)
Anion gap: 9 (ref 5–15)
BUN: 13 mg/dL (ref 8–23)
CO2: 27 mmol/L (ref 22–32)
Calcium: 8.9 mg/dL (ref 8.9–10.3)
Chloride: 96 mmol/L — ABNORMAL LOW (ref 98–111)
Creatinine: 0.74 mg/dL (ref 0.61–1.24)
GFR, Estimated: >60 mL/min (ref >60)
Glucose, Bld: 105 mg/dL — ABNORMAL HIGH (ref 70–99)
Potassium: 3.8 mmol/L (ref 3.5–5.1)
Sodium: 132 mmol/L — ABNORMAL LOW (ref 135–145)
Total Bilirubin: 0.6 mg/dL (ref 0.0–1.2)
Total Protein: 7 g/dL (ref 6.5–8.1)

## 2024-06-20 LAB — CBC WITH DIFFERENTIAL (CANCER CENTER ONLY)
Abs Immature Granulocytes: 0.06 K/uL (ref 0.00–0.07)
Basophils Absolute: 0 K/uL (ref 0.0–0.1)
Basophils Relative: 0 %
Eosinophils Absolute: 0.1 K/uL (ref 0.0–0.5)
Eosinophils Relative: 1 %
HCT: 32.4 % — ABNORMAL LOW (ref 39.0–52.0)
Hemoglobin: 10.2 g/dL — ABNORMAL LOW (ref 13.0–17.0)
Immature Granulocytes: 1 %
Lymphocytes Relative: 6 %
Lymphs Abs: 0.6 K/uL — ABNORMAL LOW (ref 0.7–4.0)
MCH: 31.6 pg (ref 26.0–34.0)
MCHC: 31.5 g/dL (ref 30.0–36.0)
MCV: 100.3 fL — ABNORMAL HIGH (ref 80.0–100.0)
Monocytes Absolute: 0.8 K/uL (ref 0.1–1.0)
Monocytes Relative: 9 %
Neutro Abs: 7.4 K/uL (ref 1.7–7.7)
Neutrophils Relative %: 83 %
Platelet Count: 408 K/uL — ABNORMAL HIGH (ref 150–400)
RBC: 3.23 MIL/uL — ABNORMAL LOW (ref 4.22–5.81)
RDW: 16.7 % — ABNORMAL HIGH (ref 11.5–15.5)
WBC Count: 9 K/uL (ref 4.0–10.5)
nRBC: 0 % (ref 0.0–0.2)

## 2024-06-20 MED ORDER — SODIUM CHLORIDE 0.9 % IV SOLN
1000.0000 mg/m2 | Freq: Once | INTRAVENOUS | Status: AC
  Administered 2024-06-20: 1976 mg via INTRAVENOUS
  Filled 2024-06-20: qty 51.97

## 2024-06-20 MED ORDER — SODIUM CHLORIDE 0.9 % IV SOLN
INTRAVENOUS | Status: DC
  Filled 2024-06-20: qty 250

## 2024-06-20 MED ORDER — PROCHLORPERAZINE MALEATE 10 MG PO TABS
10.0000 mg | ORAL_TABLET | Freq: Once | ORAL | Status: AC
Start: 2024-06-20 — End: 2024-06-20
  Administered 2024-06-20: 10 mg via ORAL
  Filled 2024-06-20: qty 1

## 2024-06-20 NOTE — Assessment & Plan Note (Signed)
 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  s/p dose reduced carboplatin , Taxol  and Keytruda  on 06/01/2023 x 4 cycles, poor PS/hospitalization--> Keytruda  maintenance until Feb 2025 --> PET progression--> 01/05/2024 concurrent chemo RT --> May 2025 CT showed slight decrease of lung mass--> June 2025 Gemcitabine .  Currently on Gemcitabine   as systemic maintenance.  Labs are reviewed and discussed with patient. He tolerates treatments well.  Proceed with Cycle 4 D1 Gemcitabine   Repeat CT scan showed mixed response. Pleural effusion R>L, decreased right side lung mass, increased left lower lobe mass.   Discussed case with pulmonology and Radonc. He will see Dr. Lenn for evaluation of feasibility of left lower lobe RT.

## 2024-06-20 NOTE — Assessment & Plan Note (Signed)
 Chemotherapy plan as listed above

## 2024-06-20 NOTE — Assessment & Plan Note (Signed)
 Increased pleural effusion, likely contributing to worse of SOB. Discussed with pulm, right side effusion is loculated.  Recommend thoracentesis by IR.

## 2024-06-20 NOTE — Progress Notes (Signed)
 Hematology/Oncology Progress note [phone] [phone]        CHIEF COMPLAINTS/PURPOSE OF CONSULTATION:  Stage IV lung adenocarcinoma.   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  s/p dose reduced carboplatin , Taxol  and Keytruda  on 06/01/2023 x 4 cycles, poor PS/hospitalization--> Keytruda  maintenance until Feb 2025 --> PET progression--> 01/05/2024 concurrent chemo RT --> May 2025 CT showed slight decrease of lung mass--> June 2025 Gemcitabine .  Currently on Gemcitabine   as systemic maintenance.  Labs are reviewed and discussed with patient. He tolerates treatments well.  Proceed with Cycle 4 D1 Gemcitabine   Repeat CT scan showed mixed response. Pleural effusion R>L, decreased right side lung mass, increased left lower lobe mass.   Discussed case with pulmonology and Radonc. He will see Dr. Lenn for evaluation of feasibility of left lower lobe RT.      Asthma-COPD overlap syndrome (HCC) Chronic respiratory failure on Oxygen .  Continue inhalers.  Follow up with Dr. Tamea  Encounter for antineoplastic chemotherapy Chemotherapy plan as listed above.   Hypocalcemia Continue calcium  1200mg  and Vitamin D  supplementation.  Calcium  has improved.   Pleural effusion Increased pleural effusion, likely contributing to worse of SOB. Discussed with pulm, right side effusion is loculated.  Recommend thoracentesis by IR.   Orders Placed This Encounter  Procedures   CBC with Differential (Cancer Center Only)    Standing Status:   Future    Expected Date:   07/17/2024    Expiration Date:   07/17/2025   CMP (Cancer Center only)    Standing Status:   Future    Expected Date:   07/17/2024    Expiration Date:    07/17/2025   CBC with Differential (Cancer Center Only)    Standing Status:   Future    Expected Date:   07/31/2024    Expiration Date:   07/31/2025   CMP (Cancer Center only)    Standing Status:   Future    Expected Date:   07/31/2024    Expiration Date:   07/31/2025   Follow up in 2 weeks  All questions were answered. The patient knows to call the clinic with any problems, questions or concerns.  Zelphia Cap, MD, PhD [facility] Hematology Oncology 06/20/2024    HISTORY OF PRESENTING ILLNESS:  Austin Patterson 68 y.o. male presents to establish care for Stage IV lung adenocarcinoma.  Patient previously followed up with Dr. Clista who left practice. He establish care with on 03/02/2024  I have reviewed his chart and materials related to his cancer extensively and collaborated history with the patient. Summary of oncologic history is as follows: Oncology History  Stage IV adenocarcinoma of lung (HCC)  04/19/2023 Imaging   PET scan showed 1. 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 2. Right lower lobe hypermetabolic pulmonary nodule, favoring synchronous primary over pulmonary metastasis. 3. Incidental findings, including: Aortic atherosclerosis (ICD10-I70.0), coronary artery atherosclerosis and emphysema (ICD10-J43.9). Prostatomegaly. Sinus disease.   05/18/2023 Initial Diagnosis   Sarcomatoid carcinoma of lung (HCC)   05/20/2023 Cancer Staging   Staging form: Lung, AJCC 8th Edition - Clinical: Stage IV (cT4, cN3, cM1) - Signed by Agrawal, Kavita, MD on 05/20/2023 Stage prefix: Initial diagnosis  06/01/2023 - 11/08/2023 Chemotherapy   Patient is on Treatment Plan : LUNG NSCLC Carboplatin  (6) + Paclitaxel  (200) + Pembrolizumab  (200) D1 q21d x 4 cycles / Pembrolizumab  (200) Maintenance D1 q21d     08/30/2023 Imaging   CT chest with contrast improving right middle  lobe mass, corresponding to the patient's known primary bronchogenic carcinoma. Small mediastinal and right perihilar nodal metastases are also improving.   Associated bilateral lower lobe pulmonary metastases, overall improved, although one right lower lobe nodular opacity is mildly progressive. Attention on follow-up is suggested.   Stable bilateral adrenal adenomas.   Aortic Atherosclerosis (ICD10-I70.0) and Emphysema (ICD10-J43.9).   11/26/2023 Imaging   CT chest abdomen pelvis with contrast 1. Interval progression of disease. The dominant mass within the right middle lobe has increased in size in the interval. There is also been significant increase in size of left lower lobe metastasis. Stable to mild increase in size of metastasis to the posterior right lower lobe. 2. Stable appearance of mediastinal and right hilar lymph nodes. 3. No signs of metastatic disease within the abdomen or pelvis. 4. Aortic Atherosclerosis (ICD10-I70.0) and Emphysema (ICD10-J43.9).   12/14/2023 Imaging   PET scan showed 1. Disease progression as evidenced by hypermetabolic nodules and masses in the lungs bilaterally as well as hypermetabolic metastatic mediastinal, hilar and periportal lymph nodes. 2. Bilateral adrenal adenomas. 3. Gastric wall thickening. 4. Aortic atherosclerosis (ICD10-I70.0). Coronary artery calcification. 5.  Emphysema (ICD10-J43.9)   01/06/2024 - 03/02/2024 Chemotherapy   Patient is on Treatment Plan : LUNG Carboplatin  + Paclitaxel  + XRT q7d     03/17/2024 Imaging   CT chest abdomen pelvis w contrast showed Overall slightly improved from previous examination. The large lung masses involving the middle lobe and left lower lobe appears slightly smaller today. Lymph nodes in the mediastinum, right hilum and upper abdomen are also slightly smaller. No new mass lesion, lymph node enlargement identified. Extensive vascular calcifications with some areas of stenosis particularly  the common femoral arteries bilaterally. Please correlate with symptoms. Fatty liver infiltration.   Advanced emphysematous lung changes.   One new small right-sided lung 4 mm nodule identified. Recommend attention on follow-up.     03/23/2024 -  Chemotherapy   Patient is on Treatment Plan : LUNG Gemcitabine  (1000) D1,8 q21d      He reports feeling ok today.  + increased SOB to his baseline level. Chronic respiratory failure,  more SOB with exertion.. On O2 2L  No nausea vomiting diarrhea.  He cuts back on alcohol use.   MEDICAL HISTORY:  Past Medical History:  Diagnosis Date   Abdominal pain 12/07/2022   Acute urinary retention 12/07/2022   Chest pain 01/24/2022   COPD (chronic obstructive pulmonary disease) (HCC)    GERD (gastroesophageal reflux disease)    Hemothorax on right 05/11/2023   Hyperlipidemia    Hypertension    Pleuritic pain 05/10/2023   Pneumothorax after biopsy 05/11/2023   Post procedure discomfort 05/13/2023   Pulmonary fibrosis (HCC) 11/2015    SURGICAL HISTORY: Past Surgical History:  Procedure Laterality Date   COLONOSCOPY     CORONARY STENT PLACEMENT     ESOPHAGOGASTRODUODENOSCOPY (EGD) WITH PROPOFOL  N/A 09/23/2016   Procedure: ESOPHAGOGASTRODUODENOSCOPY (EGD) WITH PROPOFOL ;  Surgeon: Ruel Kung, MD;  Location: [facility];  Service: Endoscopy;  Laterality: N/A;   IR IMAGING GUIDED PORT INSERTION  05/26/2023   IR IMAGING GUIDED PORT INSERTION  07/30/2023   IR RADIOLOGIST EVAL & MGMT  07/20/2023   KYPHOPLASTY N/A 03/14/2020  Procedure: T7 & T11 KYPHOPLASTY;  Surgeon: Kathlynn Sharper, MD;  Location: [facility] OR[facility];  Service: Orthopedics;  Laterality: N/A;   PORT-A-CATH REMOVAL N/A 06/14/2023   Procedure: REMOVAL PORT-A-CATH;  Surgeon: Tye Millet, DO;  Location: [facility] ORS;  Service: General;  Laterality: N/A;   SHOULDER ACROMIOPLASTY      SOCIAL HISTORY: Social History   Socioeconomic History   Marital status: Divorced    Spouse name: Not on file    Number of children: Not on file   Years of education: Not on file   Highest education level: Not on file  Occupational History   Not on file  Tobacco Use   Smoking status: Former    Current packs/day: 0.00    Average packs/day: 2.0 packs/day for 50.0 years (100.0 ttl pk-yrs)    Types: Cigarettes    Start date: 10/13/1965    Quit date: 10/14/2015    Years since quitting: 8.6   Smokeless tobacco: Former  Building services engineer status: Never Used  Substance and Sexual Activity   Alcohol use: Yes    Alcohol/week: 56.0 standard drinks of alcohol    Types: 56 Cans of beer per week    Comment: I sit around and drink beer, that's all I got to do   Drug use: No   Sexual activity: Not Currently  Other Topics Concern   Not on file  Social History Narrative   Not on file   Social Drivers of Health   Financial Resource Strain: Not on file  Food Insecurity: No Food Insecurity (12/29/2023)   Hunger Vital Sign    Worried About Running Out of Food in the Last Year: Never true    Ran Out of Food in the Last Year: Never true  Transportation Needs: Unmet Transportation Needs (01/27/2024)   PRAPARE - Administrator, Civil Service (Medical): Yes    Lack of Transportation (Non-Medical): Yes  Physical Activity: Not on file  Stress: Not on file  Social Connections: Socially Isolated (12/29/2023)   Social Connection and Isolation Panel    Frequency of Communication with Friends and Family: Three times a week    Frequency of Social Gatherings with Friends and Family: Once a week    Attends Religious Services: Never    Database administrator or Organizations: No    Attends Banker Meetings: Never    Marital Status: Divorced  Catering manager Violence: Not At Risk (12/29/2023)   Humiliation, Afraid, Rape, and Kick questionnaire    Fear of Current or Ex-Partner: No    Emotionally Abused: No    Physically Abused: No    Sexually Abused: No    FAMILY HISTORY: Family  History  Problem Relation Age of Onset   Heart disease Mother     ALLERGIES:  is allergic to amoxicillin  and tizanidine .  MEDICATIONS:  Current Outpatient Medications  Medication Sig Dispense Refill   acetaminophen  (TYLENOL ) 500 MG tablet Take 1,000 mg by mouth every 6 (six) hours as needed for moderate pain or headache.     albuterol  (VENTOLIN  HFA) 108 (90 Base) MCG/ACT inhaler Inhale 2 puffs into the lungs every 6 (six) hours as needed for wheezing or shortness of breath. 8 g 11   amLODipine  (NORVASC ) 5 MG tablet Take 1 tablet (5 mg total) by mouth at bedtime. Skip the dose if systolic BP less than 130 mmHg 30 tablet 5   aspirin  EC 81 MG tablet Take 1 tablet (81 mg total) by mouth  daily. 90 tablet 3   atorvastatin  (LIPITOR) 40 MG tablet Take 1 tablet (40 mg total) by mouth at bedtime. 90 tablet 3   CALCIUM  600/VITAMIN D  600-10 MG-MCG TABS Take 1 tablet by mouth 2 (two) times daily.     clopidogrel  (PLAVIX ) 75 MG tablet Take 1 tablet (75 mg total) by mouth daily. 90 tablet 3   Dupilumab  (DUPIXENT ) 300 MG/2ML SOAJ Inject 300 mg into the skin every 14 (fourteen) days. 12 mL 1   EPINEPHrine  0.3 mg/0.3 mL IJ SOAJ injection Inject 0.3 mg into the muscle as needed for anaphylaxis.     ferrous sulfate  325 (65 FE) MG tablet Take 1 tablet (325 mg total) by mouth 2 (two) times daily with a meal. 60 tablet 2   gabapentin  (NEURONTIN ) 300 MG capsule TAKE 1 CAPSULE BY MOUTH THREE TIMES A DAY 90 capsule 1   guaiFENesin -codeine  100-10 MG/5ML syrup Take 10 mLs by mouth 3 (three) times daily as needed for cough. 120 mL 0   ipratropium-albuterol  (DUONEB) 0.5-2.5 (3) MG/3ML SOLN INHALE 1 VIAL THROUGH NEBULIZER EVERY 6 HOURS 270 mL 2   lidocaine -prilocaine  (EMLA ) cream Apply to affected area once 30 g 5   magnesium  oxide (MAG-OX) 400 (241.3 Mg) MG tablet Take 1 tablet (400 mg total) by mouth daily. 30 tablet 0   metoprolol  succinate (TOPROL -XL) 50 MG 24 hr tablet Take 50 mg by mouth daily.     montelukast   (SINGULAIR ) 10 MG tablet Take 10 mg by mouth daily.     Multiple Vitamin (MULTIVITAMIN WITH MINERALS) TABS tablet Take 1 tablet by mouth daily. 30 tablet 0   omega-3 acid ethyl esters (LOVAZA) 1 g capsule Take 1 g by mouth daily.     oxyCODONE  (OXY IR/ROXICODONE ) 5 MG immediate release tablet Take 1 tablet (5 mg total) by mouth every 8 (eight) hours as needed for severe pain (pain score 7-10). 90 tablet 0   pantoprazole  (PROTONIX ) 40 MG tablet Take 1 tablet (40 mg total) by mouth 2 (two) times daily. 90 tablet 3   potassium chloride  SA (KLOR-CON  M) 20 MEQ tablet Take 20 mEq by mouth daily.     predniSONE  (DELTASONE ) 10 MG tablet TAKE 1 TABLET (10 MG TOTAL) BY MOUTH DAILY AS NEEDED. TAKE AS DIRECTED. 30 tablet 4   Spacer/Aero-Holding Chambers (AEROCHAMBER MV) inhaler Use as instructed 1 each 0   sucralfate  (CARAFATE ) 1 g tablet Take 1 tablet (1 g total) by mouth 3 (three) times daily before meals. Dissolve in 4tbs warm water , swish and swallow (Patient not taking: Reported on 06/20/2024) 90 tablet 4   No current facility-administered medications for this visit.   Facility-Administered Medications Ordered in Other Visits  Medication Dose Route Frequency Provider Last Rate Last Admin   0.9 %  sodium chloride  infusion   Intravenous Continuous Babara Call, MD 10 mL/hr at 06/20/24 1016 New Bag at 06/20/24 1016   heparin  lock flush 100 unit/mL  500 Units Intravenous Once Babara Call, MD        Review of Systems  Constitutional:  Positive for fatigue. Negative for appetite change, chills and fever.  HENT:   Negative for hearing loss and voice change.   Eyes:  Negative for eye problems and icterus.  Respiratory:  Positive for shortness of breath. Negative for chest tightness and cough.   Cardiovascular:  Negative for chest pain and leg swelling.  Gastrointestinal:  Negative for abdominal distention and abdominal pain.  Endocrine: Negative for hot flashes.  Genitourinary:  Negative for  difficulty urinating,  dysuria and frequency.   Musculoskeletal:  Negative for arthralgias.  Skin:  Negative for itching and rash.  Neurological:  Negative for light-headedness and numbness.  Hematological:  Negative for adenopathy. Does not bruise/bleed easily.  Psychiatric/Behavioral:  Negative for confusion.      PHYSICAL EXAMINATION: ECOG PERFORMANCE STATUS: 2 - Symptomatic, <50% confined to bed  Vitals:   06/20/24 0910  BP: 138/78  Pulse: 89  Resp: 18  Temp: (!) 96.2 F (35.7 C)  SpO2: 95%   Filed Weights   06/20/24 0910  Weight: 185 lb 6.4 oz (84.1 kg)    Physical Exam Constitutional:      General: He is not in acute distress.    Appearance: He is not diaphoretic.  HENT:     Head: Normocephalic and atraumatic.     Mouth/Throat:     Pharynx: No oropharyngeal exudate.  Eyes:     General: No scleral icterus.    Pupils: Pupils are equal, round, and reactive to light.  Cardiovascular:     Rate and Rhythm: Normal rate and regular rhythm.  Pulmonary:     Effort: Pulmonary effort is normal. No respiratory distress.     Comments: Decreased breath sounds bilaterally. On nasal cannula oxygen  Abdominal:     General: Bowel sounds are normal. There is no distension.     Palpations: Abdomen is soft.  Musculoskeletal:        General: Normal range of motion.     Cervical back: Normal range of motion and neck supple.  Skin:    General: Skin is warm and dry.     Findings: No erythema.  Neurological:     Mental Status: He is alert and oriented to person, place, and time. Mental status is at baseline.     Motor: No abnormal muscle tone.  Psychiatric:        Mood and Affect: Affect normal.      LABORATORY DATA:  I have reviewed the data as listed    Latest Ref Rng & Units 06/20/2024    8:54 AM 06/05/2024    8:55 AM 05/22/2024    8:55 AM  CBC  WBC 4.0 - 10.5 K/uL 9.0  9.0  10.2   Hemoglobin 13.0 - 17.0 g/dL 89.7  89.2  89.0   Hematocrit 39.0 - 52.0 % 32.4  34.1  33.9   Platelets 150 - 400  K/uL 408  251  312       Latest Ref Rng & Units 06/20/2024    8:54 AM 06/05/2024    8:55 AM 05/22/2024    8:55 AM  CMP  Glucose 70 - 99 mg/dL 894  882  889   BUN 8 - 23 mg/dL 13  17  18    Creatinine 0.61 - 1.24 mg/dL 9.25  9.29  9.23   Sodium 135 - 145 mmol/L 132  136  132   Potassium 3.5 - 5.1 mmol/L 3.8  4.0  4.0   Chloride 98 - 111 mmol/L 96  101  96   CO2 22 - 32 mmol/L 27  27  27    Calcium  8.9 - 10.3 mg/dL 8.9  9.0  9.3   Total Protein 6.5 - 8.1 g/dL 7.0  6.6  6.9   Total Bilirubin 0.0 - 1.2 mg/dL 0.6  0.6  0.6   Alkaline Phos 38 - 126 U/L 113  91  104   AST 15 - 41 U/L 20  18  23    ALT 0 -  44 U/L 14  17  19       RADIOGRAPHIC STUDIES: I have personally reviewed the radiological images as listed and agreed with the findings in the report. CT CHEST ABDOMEN PELVIS W CONTRAST Addendum Date: 06/20/2024 ADDENDUM REPORT: 06/20/2024 10:16 ADDENDUM: Addendum to include severe changes of emphysema, omitted on the earlier same day report. Electronically Signed   By: Limin  Xu M.D.   On: 06/20/2024 10:16   Result Date: 06/20/2024 CLINICAL DATA:  Right lung adenocarcinoma. Restaging. * Tracking Code: BO * EXAM: CT CHEST, ABDOMEN, AND PELVIS WITH CONTRAST TECHNIQUE: Multidetector CT imaging of the chest, abdomen and pelvis was performed following the standard protocol during bolus administration of intravenous contrast. RADIATION DOSE REDUCTION: This exam was performed according to the departmental dose-optimization program which includes automated exposure control, adjustment of the mA and/or kV according to patient size and/or use of iterative reconstruction technique. CONTRAST:  OMNIPAQUE  IOHEXOL  300 MG/ML  SOLN COMPARISON:  CT chest, abdomen, and pelvis dated 05/3 FINDINGS: CT CHEST FINDINGS Cardiovascular: Left chest wall port terminates in the lower SVC. Normal heart size. No significant pericardial fluid/thickening. Great vessels are normal in course and caliber. No central pulmonary  emboli. Coronary artery calcifications. Mediastinum/Nodes: Imaged thyroid  gland without nodules meeting criteria for imaging follow-up by size. Normal esophagus. No pathologically enlarged axillary, supraclavicular, mediastinal, or hilar lymph nodes. Lungs/Pleura: The central airways are patent. Trace secretions layering within the trachea extending into bilateral bronchials. Mild peribronchial wall thickening. Diffuse interlobular septal thickening similar left apical scarring. There is new relaxation atelectasis of the right lower lobe. Interval decreased size of right middle lobe mass measuring 3.7 x 3.2 cm (2:43), previously 5.5 x 5.0 cm. The left lower lobe mass has increased in size, measuring 5.2 x 3.9 cm, previously 4.5 x 3.2 cm. Subpleural rounded consolidations in the right middle lobe (4:87) and right lower lobe (4:120) are new. Previously noted 4 mm right middle lobe nodule is not well seen on today's examination. No pneumothorax. New large right and trace left pleural effusions. Musculoskeletal: No acute or abnormal lytic or blastic osseous lesions. Unchanged augmentation of T6 and T10 vertebral bodies. Similar chronic fracture of the bilateral scapulae. CT ABDOMEN PELVIS FINDINGS Hepatobiliary: Ill-defined subcentimeter hypodensity in segment 7/8 (2:56), too small to characterize but unchanged. No intra or extrahepatic biliary ductal dilation. Normal gallbladder. Pancreas: No focal lesions or main ductal dilation. Spleen: Normal in size without focal abnormality. Adrenals/Urinary Tract: Similar fullness of the adrenal glands, previously characterized as adenomas. No specific follow-up imaging recommended. No suspicious renal mass, calculi, or hydronephrosis. Similar scattered areas of bilateral renal cortical scarring. No focal bladder wall thickening. Stomach/Bowel: Normal appearance of the stomach. No evidence of bowel wall thickening, distention, or inflammatory changes. Normal appendix.  Vascular/Lymphatic: Aortic atherosclerosis. Unchanged 12 mm periportal lymph node (2:63). Reproductive: Prostate is unremarkable. Other: No free fluid, fluid collection, or free air. Musculoskeletal: No acute or abnormal lytic or blastic osseous findings. Multilevel degenerative changes of the lumbar spine. 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 Small paraumbilical hernia contains a loop of nonobstructed small bowel. IMPRESSION: 1. Interval decreased size of right middle lobe mass and increased size of left lower lobe mass. 2. New pulmonary edema and large right and trace left pleural effusions with new subpleural rounded consolidations in the right middle and lower lobes, which may represent atelectasis, attention on follow-up. 3. Unchanged 12 mm periportal lymph node. 4. Small paraumbilical hernia contains a loop of nonobstructed small bowel. 5. Aortic  Atherosclerosis (ICD10-I70.0). Coronary artery calcifications. Assessment for potential risk factor modification, dietary therapy or pharmacologic therapy may be warranted, if clinically indicated. Electronically Signed: By: Limin  Xu M.D. On: 06/20/2024 09:07

## 2024-06-20 NOTE — Assessment & Plan Note (Signed)
 Chronic respiratory failure on Oxygen .  Continue inhalers.  Follow up with Dr. Tamea

## 2024-06-20 NOTE — Assessment & Plan Note (Signed)
 Continue calcium  1200mg  and Vitamin D  supplementation.  Calcium  has improved.

## 2024-06-21 ENCOUNTER — Ambulatory Visit
Admission: RE | Admit: 2024-06-21 | Discharge: 2024-06-21 | Disposition: A | Discharge status: Home / Self Care | Source: Ambulatory Visit | Attending: Urology

## 2024-06-21 ENCOUNTER — Ambulatory Visit
Admission: RE | Admit: 2024-06-21 | Discharge: 2024-06-21 | Disposition: A | Discharge status: Home / Self Care | Source: Ambulatory Visit | Attending: Oncology | Admitting: Oncology

## 2024-06-21 ENCOUNTER — Other Ambulatory Visit: Payer: Self-pay | Admitting: Urology

## 2024-06-21 DIAGNOSIS — Z9889 Other specified postprocedural states: Secondary | ICD-10-CM

## 2024-06-21 DIAGNOSIS — J90 Pleural effusion, not elsewhere classified: Secondary | ICD-10-CM | POA: Insufficient documentation

## 2024-06-21 LAB — BODY FLUID CELL COUNT WITH DIFFERENTIAL
Eos, Fluid: 0 %
Lymphs, Fluid: 6 %
Monocyte-Macrophage-Serous Fluid: 61 % (ref 50–90)
Neutrophil Count, Fluid: 33 % — ABNORMAL HIGH (ref 0–25)
Total Nucleated Cell Count, Fluid: 674 uL (ref 0–1000)

## 2024-06-21 LAB — PROTEIN, PLEURAL OR PERITONEAL FLUID: Total protein, fluid: 4 g/dL

## 2024-06-21 LAB — ALBUMIN, PLEURAL OR PERITONEAL FLUID: Albumin, Fluid: 2.8 g/dL

## 2024-06-21 LAB — LACTATE DEHYDROGENASE, PLEURAL OR PERITONEAL FLUID: LD, Fluid: 131 U/L — ABNORMAL HIGH (ref 3–23)

## 2024-06-21 MED ORDER — LIDOCAINE HCL (PF) 1 % IJ SOLN
10.0000 mL | Freq: Once | INTRAMUSCULAR | Status: AC
  Administered 2024-06-21: 10 mL via INTRADERMAL
  Filled 2024-06-21: qty 10

## 2024-06-21 NOTE — Procedures (Signed)
 PROCEDURE SUMMARY:  Successful image-guided right-sided diagnostic and therapeutic thoracentesis. Yielded 0.70 liters of clear, straw-colored pleural fluid. Patient tolerated procedure well. EBL: Zero No immediate complications.  Specimen was sent for labs. Post procedure CXR shows no pneumothorax.  Please see imaging section of Epic for full dictation.  [facility] LABOR Bethanne Mule PA-C 06/21/2024 2:43 PM

## 2024-06-25 LAB — BODY FLUID CULTURE W GRAM STAIN
Culture: NO GROWTH
Gram Stain: NONE SEEN

## 2024-06-26 LAB — CYTOLOGY - NON PAP

## 2024-07-03 ENCOUNTER — Other Ambulatory Visit: Payer: Self-pay

## 2024-07-03 ENCOUNTER — Inpatient Hospital Stay

## 2024-07-03 ENCOUNTER — Encounter: Payer: Self-pay | Admitting: Oncology

## 2024-07-03 ENCOUNTER — Inpatient Hospital Stay (HOSPITAL_BASED_OUTPATIENT_CLINIC_OR_DEPARTMENT_OTHER): Admitting: Oncology

## 2024-07-03 VITALS — BP 132/73 | HR 79 | Temp 95.5°F | Resp 18 | Wt 188.7 lb

## 2024-07-03 DIAGNOSIS — C3491 Malignant neoplasm of unspecified part of right bronchus or lung: Secondary | ICD-10-CM

## 2024-07-03 DIAGNOSIS — J90 Pleural effusion, not elsewhere classified: Secondary | ICD-10-CM | POA: Diagnosis not present

## 2024-07-03 DIAGNOSIS — F1090 Alcohol use, unspecified, uncomplicated: Secondary | ICD-10-CM | POA: Diagnosis not present

## 2024-07-03 DIAGNOSIS — Z5111 Encounter for antineoplastic chemotherapy: Secondary | ICD-10-CM

## 2024-07-03 LAB — CBC WITH DIFFERENTIAL (CANCER CENTER ONLY)
Abs Immature Granulocytes: 0.04 K/uL (ref 0.00–0.07)
Basophils Absolute: 0 K/uL (ref 0.0–0.1)
Basophils Relative: 0 %
Eosinophils Absolute: 0 K/uL (ref 0.0–0.5)
Eosinophils Relative: 1 %
HCT: 33.4 % — ABNORMAL LOW (ref 39.0–52.0)
Hemoglobin: 10.3 g/dL — ABNORMAL LOW (ref 13.0–17.0)
Immature Granulocytes: 1 %
Lymphocytes Relative: 6 %
Lymphs Abs: 0.5 K/uL — ABNORMAL LOW (ref 0.7–4.0)
MCH: 31.2 pg (ref 26.0–34.0)
MCHC: 30.8 g/dL (ref 30.0–36.0)
MCV: 101.2 fL — ABNORMAL HIGH (ref 80.0–100.0)
Monocytes Absolute: 0.9 K/uL (ref 0.1–1.0)
Monocytes Relative: 12 %
Neutro Abs: 6.3 K/uL (ref 1.7–7.7)
Neutrophils Relative %: 80 %
Platelet Count: 246 K/uL (ref 150–400)
RBC: 3.3 MIL/uL — ABNORMAL LOW (ref 4.22–5.81)
RDW: 18 % — ABNORMAL HIGH (ref 11.5–15.5)
WBC Count: 7.8 K/uL (ref 4.0–10.5)
nRBC: 0 % (ref 0.0–0.2)

## 2024-07-03 LAB — CMP (CANCER CENTER ONLY)
ALT: 20 U/L (ref 0–44)
AST: 22 U/L (ref 15–41)
Albumin: 3.8 g/dL (ref 3.5–5.0)
Alkaline Phosphatase: 107 U/L (ref 38–126)
Anion gap: 8 (ref 5–15)
BUN: 17 mg/dL (ref 8–23)
CO2: 29 mmol/L (ref 22–32)
Calcium: 8.7 mg/dL — ABNORMAL LOW (ref 8.9–10.3)
Chloride: 98 mmol/L (ref 98–111)
Creatinine: 0.67 mg/dL (ref 0.61–1.24)
GFR, Estimated: >60 mL/min (ref >60)
Glucose, Bld: 111 mg/dL — ABNORMAL HIGH (ref 70–99)
Potassium: 3.9 mmol/L (ref 3.5–5.1)
Sodium: 135 mmol/L (ref 135–145)
Total Bilirubin: 0.5 mg/dL (ref 0.0–1.2)
Total Protein: 6.6 g/dL (ref 6.5–8.1)

## 2024-07-03 LAB — LACTATE DEHYDROGENASE: LDH: 191 U/L (ref 98–192)

## 2024-07-03 MED ORDER — PROCHLORPERAZINE MALEATE 10 MG PO TABS
10.0000 mg | ORAL_TABLET | Freq: Once | ORAL | Status: AC
  Administered 2024-07-03: 10 mg via ORAL
  Filled 2024-07-03: qty 1

## 2024-07-03 MED ORDER — FUROSEMIDE 20 MG PO TABS: 20.0000 mg | ORAL_TABLET | Freq: Every day | ORAL | 0 refills | Status: DC

## 2024-07-03 MED ORDER — SODIUM CHLORIDE 0.9 % IV SOLN
1000.0000 mg/m2 | Freq: Once | INTRAVENOUS | Status: AC
  Administered 2024-07-03: 1976 mg via INTRAVENOUS
  Filled 2024-07-03: qty 51.97

## 2024-07-03 MED ORDER — SODIUM CHLORIDE 0.9 % IV SOLN
INTRAVENOUS | Status: DC
  Filled 2024-07-03: qty 250

## 2024-07-03 NOTE — Patient Instructions (Signed)
 [facility] - A DEPT OF [facility]  Discharge Instructions: Thank you for choosing [facility] to provide your oncology and hematology care.  If you have a lab appointment with the Cancer Center, please go directly to the Cancer Center and check in at the registration area.  Wear comfortable clothing and clothing appropriate for easy access to any Portacath or PICC line.   We strive to give you quality time with your provider. You may need to reschedule your appointment if you arrive late (15 or more minutes).  Arriving late affects you and other patients whose appointments are after yours.  Also, if you miss three or more appointments without notifying the office, you may be dismissed from the clinic at the provider's discretion.      For prescription refill requests, have your pharmacy contact our office and allow 72 hours for refills to be completed.    Today you received the following chemotherapy and/or immunotherapy agents Gemzar      To help prevent nausea and vomiting after your treatment, we encourage you to take your nausea medication as directed.  BELOW ARE SYMPTOMS THAT SHOULD BE REPORTED IMMEDIATELY: *FEVER GREATER THAN 100.4 F (38 C) OR HIGHER *CHILLS OR SWEATING *NAUSEA AND VOMITING THAT IS NOT CONTROLLED WITH YOUR NAUSEA MEDICATION *UNUSUAL SHORTNESS OF BREATH *UNUSUAL BRUISING OR BLEEDING *URINARY PROBLEMS (pain or burning when urinating, or frequent urination) *BOWEL PROBLEMS (unusual diarrhea, constipation, pain near the anus) TENDERNESS IN MOUTH AND THROAT WITH OR WITHOUT PRESENCE OF ULCERS (sore throat, sores in mouth, or a toothache) UNUSUAL RASH, SWELLING OR PAIN  UNUSUAL VAGINAL DISCHARGE OR ITCHING   Items with * indicate a potential emergency and should be followed up as soon as possible or go to the Emergency Department if any problems should occur.  Please show the CHEMOTHERAPY ALERT CARD or IMMUNOTHERAPY ALERT  CARD at check-in to the Emergency Department and triage nurse.  Should you have questions after your visit or need to cancel or reschedule your appointment, please contact [facility] - A DEPT OF [facility]  [phone] and follow the prompts.  Office hours are 8:00 a.m. to 4:30 p.m. Monday - Friday. Please note that voicemails left after 4:00 p.m. may not be returned until the following business day.  We are closed weekends and major holidays. You have access to a nurse at all times for urgent questions. Please call the main number to the clinic [phone] and follow the prompts.  For any non-urgent questions, you may also contact your provider using MyChart. We now offer e-Visits for anyone 60 and older to request care online for non-urgent symptoms. For details visit [email or web address].   Also download the MyChart app! Go to the app store, search "MyChart", open the app, select [place], and log in with your MyChart username and password.

## 2024-07-03 NOTE — Assessment & Plan Note (Addendum)
 Increased pleural effusion, likely contributing to worse of SOB. Discussed with pulm, right side effusion is loculated.  S/p thoracentesis by IR. Cytology is negative.  Pleural fluid LDH/serum LDH ratio > 0.6 - exudate.  CT also showed pulmonary edema, recommend trials of Lasix  20mg  daily x 3 days,

## 2024-07-03 NOTE — Assessment & Plan Note (Signed)
 Alcohol cessation is discussed with patient. Encourage cessation effort

## 2024-07-03 NOTE — Assessment & Plan Note (Signed)
 Continue calcium  1200mg  and Vitamin D  supplementation.  Calcium  has improved.

## 2024-07-03 NOTE — Assessment & Plan Note (Signed)
 Chemotherapy plan as listed above

## 2024-07-03 NOTE — Assessment & Plan Note (Signed)
 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  s/p dose reduced carboplatin , Taxol  and Keytruda  on 06/01/2023 x 4 cycles, poor PS/hospitalization--> Keytruda  maintenance until Feb 2025 --> PET progression--> 01/05/2024 concurrent chemo RT --> May 2025 CT showed slight decrease of lung mass--> June 2025 Gemcitabine .  Currently on Gemcitabine   as systemic maintenance.  Labs are reviewed and discussed with patient. He tolerates treatments well.  Proceed with Cycle 4 D15 Gemcitabine   Repeat CT scan showed mixed response. Pleural effusion R>L, decreased right side lung mass, increased left lower lobe mass.   Discussed case with pulmonology and Radonc. He will see Dr. Lenn for evaluation of feasibility of left lower lobe RT.

## 2024-07-03 NOTE — Progress Notes (Signed)
 Hematology/Oncology Progress note [phone] [phone]        CHIEF COMPLAINTS/PURPOSE OF CONSULTATION:  Stage IV lung adenocarcinoma.   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  s/p dose reduced carboplatin , Taxol  and Keytruda  on 06/01/2023 x 4 cycles, poor PS/hospitalization--> Keytruda  maintenance until Feb 2025 --> PET progression--> 01/05/2024 concurrent chemo RT --> May 2025 CT showed slight decrease of lung mass--> June 2025 Gemcitabine .  Currently on Gemcitabine   as systemic maintenance.  Labs are reviewed and discussed with patient. He tolerates treatments well.  Proceed with Cycle 4 D15 Gemcitabine   Repeat CT scan showed mixed response. Pleural effusion R>L, decreased right side lung mass, increased left lower lobe mass.   Discussed case with pulmonology and Radonc. He will see Dr. Lenn for evaluation of feasibility of left lower lobe RT.      Alcohol use Alcohol cessation is discussed with patient. Encourage cessation effort  Encounter for antineoplastic chemotherapy Chemotherapy plan as listed above.   Pleural effusion Increased pleural effusion, likely contributing to worse of SOB. Discussed with pulm, right side effusion is loculated.  S/p thoracentesis by IR. Cytology is negative.  Pleural fluid LDH/serum LDH ratio > 0.6 - exudate.  CT also showed pulmonary edema, recommend trials of Lasix  20mg  daily x 3 days,   Hypocalcemia Continue calcium  1200mg  and Vitamin D  supplementation.  Calcium  has improved.   Orders Placed This Encounter  Procedures   Lactate dehydrogenase    Standing Status:   Future    Number of Occurrences:   1    Expected Date:   07/03/2024    Expiration Date:    10/01/2024   Follow up in 2 weeks  All questions were answered. The patient knows to call the clinic with any problems, questions or concerns.  Zelphia Cap, MD, PhD [facility] Hematology Oncology 07/03/2024    HISTORY OF PRESENTING ILLNESS:  Austin Patterson 68 y.o. male presents to establish care for Stage IV lung adenocarcinoma.  Patient previously followed up with Dr. Clista who left practice. He establish care with on 03/02/2024  I have reviewed his chart and materials related to his cancer extensively and collaborated history with the patient. Summary of oncologic history is as follows: Oncology History  Stage IV adenocarcinoma of lung (HCC)  04/19/2023 Imaging   PET scan showed 1. 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 2. Right lower lobe hypermetabolic pulmonary nodule, favoring synchronous primary over pulmonary metastasis. 3. Incidental findings, including: Aortic atherosclerosis (ICD10-I70.0), coronary artery atherosclerosis and emphysema (ICD10-J43.9). Prostatomegaly. Sinus disease.   05/18/2023 Initial Diagnosis   Sarcomatoid carcinoma of lung (HCC)   05/20/2023 Cancer Staging   Staging form: Lung, AJCC 8th Edition - Clinical: Stage IV (cT4, cN3, cM1) - Signed by Agrawal, Kavita, MD on 05/20/2023 Stage prefix: Initial diagnosis   06/01/2023 - 11/08/2023 Chemotherapy   Patient is on Treatment Plan : LUNG NSCLC Carboplatin  (6) + Paclitaxel  (200) + Pembrolizumab  (200) D1 q21d x 4 cycles / Pembrolizumab  (200) Maintenance D1 q21d     08/30/2023 Imaging   CT chest with contrast improving right middle lobe mass, corresponding to the patient's known primary bronchogenic carcinoma. Small mediastinal and right perihilar nodal metastases are  also improving.   Associated bilateral lower lobe pulmonary metastases, overall improved, although one right lower lobe  nodular opacity is mildly progressive. Attention on follow-up is suggested.   Stable bilateral adrenal adenomas.   Aortic Atherosclerosis (ICD10-I70.0) and Emphysema (ICD10-J43.9).   11/26/2023 Imaging   CT chest abdomen pelvis with contrast 1. Interval progression of disease. The dominant mass within the right middle lobe has increased in size in the interval. There is also been significant increase in size of left lower lobe metastasis. Stable to mild increase in size of metastasis to the posterior right lower lobe. 2. Stable appearance of mediastinal and right hilar lymph nodes. 3. No signs of metastatic disease within the abdomen or pelvis. 4. Aortic Atherosclerosis (ICD10-I70.0) and Emphysema (ICD10-J43.9).   12/14/2023 Imaging   PET scan showed 1. Disease progression as evidenced by hypermetabolic nodules and masses in the lungs bilaterally as well as hypermetabolic metastatic mediastinal, hilar and periportal lymph nodes. 2. Bilateral adrenal adenomas. 3. Gastric wall thickening. 4. Aortic atherosclerosis (ICD10-I70.0). Coronary artery calcification. 5.  Emphysema (ICD10-J43.9)   01/06/2024 - 03/02/2024 Chemotherapy   Patient is on Treatment Plan : LUNG Carboplatin  + Paclitaxel  + XRT q7d     03/17/2024 Imaging   CT chest abdomen pelvis w contrast showed Overall slightly improved from previous examination. The large lung masses involving the middle lobe and left lower lobe appears slightly smaller today. Lymph nodes in the mediastinum, right hilum and upper abdomen are also slightly smaller. No new mass lesion, lymph node enlargement identified. Extensive vascular calcifications with some areas of stenosis particularly the common femoral arteries bilaterally. Please correlate with symptoms. Fatty liver infiltration.   Advanced emphysematous lung changes.   One new small right-sided lung 4 mm nodule identified. Recommend attention on follow-up.     03/23/2024 -  Chemotherapy    Patient is on Treatment Plan : LUNG Gemcitabine  (1000) D1,8 q21d     06/12/2024 Imaging   CT chest abdomen pelvis w contrast showed   1. Interval decreased size of right middle lobe mass and increased size of left lower lobe mass. 2. New pulmonary edema and large right and trace left pleural effusions with new subpleural rounded consolidations in the right middle and lower lobes, which may represent atelectasis, attention on follow-up. 3. Unchanged 12 mm periportal lymph node. 4. Small paraumbilical hernia contains a loop of nonobstructed small bowel. 5. Aortic Atherosclerosis (ICD10-I70.0). Coronary artery calcifications. Assessment for potential risk factor modification, dietary therapy or pharmacologic therapy may be warranted, if clinically indicated.    He reports feeling ok today.  + increased SOB to his baseline level. S/p thoracentesis, SOB remains same, no significant improvement.  Chronic respiratory failure,  more SOB with exertion.. On O2 2L  No nausea vomiting diarrhea.  He cuts back on alcohol use.   MEDICAL HISTORY:  Past Medical History:  Diagnosis Date   Abdominal pain 12/07/2022   Acute urinary retention 12/07/2022   Chest pain 01/24/2022   COPD (chronic obstructive pulmonary disease) (HCC)    GERD (gastroesophageal reflux disease)    Hemothorax on right 05/11/2023   Hyperlipidemia    Hypertension    Pleuritic pain 05/10/2023   Pneumothorax after biopsy 05/11/2023   Post procedure discomfort 05/13/2023   Pulmonary fibrosis (HCC) 11/2015    SURGICAL HISTORY: Past Surgical History:  Procedure Laterality Date   COLONOSCOPY     CORONARY STENT PLACEMENT     ESOPHAGOGASTRODUODENOSCOPY (EGD) WITH PROPOFOL  N/A 09/23/2016   Procedure: ESOPHAGOGASTRODUODENOSCOPY (EGD) WITH PROPOFOL ;  Surgeon: Ruel Kung, MD;  Location: [facility] ENDOSCOPY;  Service: Endoscopy;  Laterality: N/A;   IR IMAGING GUIDED PORT INSERTION  05/26/2023   IR IMAGING GUIDED PORT INSERTION   07/30/2023   IR RADIOLOGIST EVAL & MGMT  07/20/2023   KYPHOPLASTY N/A 03/14/2020   Procedure: T7 & T11 KYPHOPLASTY;  Surgeon: Kathlynn Sharper, MD;  Location: [facility] ORS;  Service: Orthopedics;  Laterality: N/A;   PORT-A-CATH REMOVAL N/A 06/14/2023   Procedure: REMOVAL PORT-A-CATH;  Surgeon: Tye Millet, DO;  Location: [facility] ORS;  Service: General;  Laterality: N/A;   SHOULDER ACROMIOPLASTY      SOCIAL HISTORY: Social History   Socioeconomic History   Marital status: Divorced    Spouse name: Not on file   Number of children: Not on file   Years of education: Not on file   Highest education level: Not on file  Occupational History   Not on file  Tobacco Use   Smoking status: Former    Current packs/day: 0.00    Average packs/day: 2.0 packs/day for 50.0 years (100.0 ttl pk-yrs)    Types: Cigarettes    Start date: 10/13/1965    Quit date: 10/14/2015    Years since quitting: 8.7   Smokeless tobacco: Former  Building services engineer status: Never Used  Substance and Sexual Activity   Alcohol use: Yes    Alcohol/week: 56.0 standard drinks of alcohol    Types: 56 Cans of beer per week    Comment: I sit around and drink beer, that's all I got to do   Drug use: No   Sexual activity: Not Currently  Other Topics Concern   Not on file  Social History Narrative   Not on file   Social Drivers of Health   Financial Resource Strain: Not on file  Food Insecurity: No Food Insecurity (12/29/2023)   Hunger Vital Sign    Worried About Running Out of Food in the Last Year: Never true    Ran Out of Food in the Last Year: Never true  Transportation Needs: Unmet Transportation Needs (01/27/2024)   PRAPARE - Administrator, Civil Service (Medical): Yes    Lack of Transportation (Non-Medical): Yes  Physical Activity: Not on file  Stress: Not on file  Social Connections: Socially Isolated (12/29/2023)   Social Connection and Isolation Panel    Frequency of Communication with Friends and  Family: Three times a week    Frequency of Social Gatherings with Friends and Family: Once a week    Attends Religious Services: Never    Database administrator or Organizations: No    Attends Banker Meetings: Never    Marital Status: Divorced  Catering manager Violence: Not At Risk (12/29/2023)   Humiliation, Afraid, Rape, and Kick questionnaire    Fear of Current or Ex-Partner: No    Emotionally Abused: No    Physically Abused: No    Sexually Abused: No    FAMILY HISTORY: Family History  Problem Relation Age of Onset   Heart disease Mother     ALLERGIES:  is allergic to amoxicillin  and tizanidine .  MEDICATIONS:  Current Outpatient Medications  Medication Sig Dispense Refill   acetaminophen  (TYLENOL ) 500 MG tablet Take 1,000 mg by mouth every 6 (six) hours as needed for moderate pain or headache.     albuterol  (PROVENTIL ) (2.5 MG/3ML) 0.083% nebulizer solution Take 2.5 mg by nebulization every 4 (four) hours.     albuterol  (VENTOLIN  HFA) 108 (90 Base)  MCG/ACT inhaler Inhale 2 puffs into the lungs every 6 (six) hours as needed for wheezing or shortness of breath. 8 g 11   amLODipine  (NORVASC ) 5 MG tablet Take 1 tablet (5 mg total) by mouth at bedtime. Skip the dose if systolic BP less than 130 mmHg 30 tablet 5   aspirin  EC 81 MG tablet Take 1 tablet (81 mg total) by mouth daily. 90 tablet 3   atorvastatin  (LIPITOR) 40 MG tablet Take 1 tablet (40 mg total) by mouth at bedtime. 90 tablet 3   CALCIUM  600/VITAMIN D  600-10 MG-MCG TABS Take 1 tablet by mouth 2 (two) times daily.     clopidogrel  (PLAVIX ) 75 MG tablet Take 1 tablet (75 mg total) by mouth daily. 90 tablet 3   Dupilumab  (DUPIXENT ) 300 MG/2ML SOAJ Inject 300 mg into the skin every 14 (fourteen) days. 12 mL 1   EPINEPHrine  0.3 mg/0.3 mL IJ SOAJ injection Inject 0.3 mg into the muscle as needed for anaphylaxis.     ferrous sulfate  325 (65 FE) MG tablet Take 1 tablet (325 mg total) by mouth 2 (two) times daily with  a meal. 60 tablet 2   furosemide  (LASIX ) 20 MG tablet Take 1 tablet (20 mg total) by mouth daily. 3 tablet 0   gabapentin  (NEURONTIN ) 300 MG capsule TAKE 1 CAPSULE BY MOUTH THREE TIMES A DAY 90 capsule 1   guaiFENesin -codeine  100-10 MG/5ML syrup Take 10 mLs by mouth 3 (three) times daily as needed for cough. 120 mL 0   ipratropium-albuterol  (DUONEB) 0.5-2.5 (3) MG/3ML SOLN INHALE 1 VIAL THROUGH NEBULIZER EVERY 6 HOURS 270 mL 2   lidocaine -prilocaine  (EMLA ) cream Apply to affected area once 30 g 5   magnesium  oxide (MAG-OX) 400 (241.3 Mg) MG tablet Take 1 tablet (400 mg total) by mouth daily. 30 tablet 0   metoprolol  succinate (TOPROL -XL) 50 MG 24 hr tablet Take 50 mg by mouth daily.     montelukast  (SINGULAIR ) 10 MG tablet Take 10 mg by mouth daily.     Multiple Vitamin (MULTIVITAMIN WITH MINERALS) TABS tablet Take 1 tablet by mouth daily. 30 tablet 0   omega-3 acid ethyl esters (LOVAZA) 1 g capsule Take 1 g by mouth daily.     oxyCODONE  (OXY IR/ROXICODONE ) 5 MG immediate release tablet Take 1 tablet (5 mg total) by mouth every 8 (eight) hours as needed for severe pain (pain score 7-10). 90 tablet 0   pantoprazole  (PROTONIX ) 40 MG tablet Take 1 tablet (40 mg total) by mouth 2 (two) times daily. 90 tablet 3   potassium chloride  SA (KLOR-CON  M) 20 MEQ tablet Take 20 mEq by mouth daily.     predniSONE  (DELTASONE ) 10 MG tablet TAKE 1 TABLET (10 MG TOTAL) BY MOUTH DAILY AS NEEDED. TAKE AS DIRECTED. 30 tablet 4   Spacer/Aero-Holding Chambers (AEROCHAMBER MV) inhaler Use as instructed 1 each 0   sucralfate  (CARAFATE ) 1 g tablet Take 1 tablet (1 g total) by mouth 3 (three) times daily before meals. Dissolve in 4tbs warm water , swish and swallow (Patient not taking: Reported on 07/03/2024) 90 tablet 4   No current facility-administered medications for this visit.   Facility-Administered Medications Ordered in Other Visits  Medication Dose Route Frequency Provider Last Rate Last Admin   0.9 %  sodium  chloride infusion   Intravenous Continuous Babara Call, MD   Stopped at 07/03/24 1125   heparin  lock flush 100 unit/mL  500 Units Intravenous Once Oretha Weismann, MD  Review of Systems  Constitutional:  Positive for fatigue. Negative for appetite change, chills and fever.  HENT:   Negative for hearing loss and voice change.   Eyes:  Negative for eye problems and icterus.  Respiratory:  Positive for shortness of breath. Negative for chest tightness and cough.   Cardiovascular:  Negative for chest pain and leg swelling.  Gastrointestinal:  Negative for abdominal distention and abdominal pain.  Endocrine: Negative for hot flashes.  Genitourinary:  Negative for difficulty urinating, dysuria and frequency.   Musculoskeletal:  Negative for arthralgias.  Skin:  Negative for itching and rash.  Neurological:  Negative for light-headedness and numbness.  Hematological:  Negative for adenopathy. Does not bruise/bleed easily.  Psychiatric/Behavioral:  Negative for confusion.      PHYSICAL EXAMINATION: ECOG PERFORMANCE STATUS: 2 - Symptomatic, <50% confined to bed  Vitals:   07/03/24 0913  BP: 132/73  Pulse: 79  Resp: 18  Temp: (!) 95.5 F (35.3 C)  SpO2: 91%   Filed Weights   07/03/24 0913  Weight: 188 lb 11.2 oz (85.6 kg)    Physical Exam Constitutional:      General: He is not in acute distress.    Appearance: He is not diaphoretic.  HENT:     Head: Normocephalic and atraumatic.     Mouth/Throat:     Pharynx: No oropharyngeal exudate.  Eyes:     General: No scleral icterus.    Pupils: Pupils are equal, round, and reactive to light.  Cardiovascular:     Rate and Rhythm: Normal rate and regular rhythm.  Pulmonary:     Effort: Pulmonary effort is normal. No respiratory distress.     Comments: Decreased breath sounds bilaterally. On nasal cannula oxygen  Abdominal:     General: Bowel sounds are normal. There is no distension.     Palpations: Abdomen is soft.  Musculoskeletal:         General: Normal range of motion.     Cervical back: Normal range of motion and neck supple.     Comments: Trace edema of lower extremities  bilaterally.    Skin:    General: Skin is warm and dry.     Findings: No erythema.  Neurological:     Mental Status: He is alert and oriented to person, place, and time. Mental status is at baseline.     Motor: No abnormal muscle tone.  Psychiatric:        Mood and Affect: Affect normal.      LABORATORY DATA:  I have reviewed the data as listed    Latest Ref Rng & Units 07/03/2024    9:01 AM 06/20/2024    8:54 AM 06/05/2024    8:55 AM  CBC  WBC 4.0 - 10.5 K/uL 7.8  9.0  9.0   Hemoglobin 13.0 - 17.0 g/dL 89.6  89.7  89.2   Hematocrit 39.0 - 52.0 % 33.4  32.4  34.1   Platelets 150 - 400 K/uL 246  408  251       Latest Ref Rng & Units 07/03/2024    9:01 AM 06/20/2024    8:54 AM 06/05/2024    8:55 AM  CMP  Glucose 70 - 99 mg/dL 888  894  882   BUN 8 - 23 mg/dL 17  13  17    Creatinine 0.61 - 1.24 mg/dL 9.32  9.25  9.29   Sodium 135 - 145 mmol/L 135  132  136   Potassium 3.5 - 5.1 mmol/L  3.9  3.8  4.0   Chloride 98 - 111 mmol/L 98  96  101   CO2 22 - 32 mmol/L 29  27  27    Calcium  8.9 - 10.3 mg/dL 8.7  8.9  9.0   Total Protein 6.5 - 8.1 g/dL 6.6  7.0  6.6   Total Bilirubin 0.0 - 1.2 mg/dL 0.5  0.6  0.6   Alkaline Phos 38 - 126 U/L 107  113  91   AST 15 - 41 U/L 22  20  18    ALT 0 - 44 U/L 20  14  17       RADIOGRAPHIC STUDIES: I have personally reviewed the radiological images as listed and agreed with the findings in the report. DG Chest Port 1 View Result Date: 06/21/2024 CLINICAL DATA:  Status post right thoracentesis. EXAM: PORTABLE CHEST 1 VIEW COMPARISON:  CT chest 06/12/2024 FINDINGS: Stable heart size and appearance of Port-A-Cath. No definite right pneumothorax following thoracentesis. Some relative lucency overlying the right lower chest corresponds to a large pulmonary bleb seen by CT. No significant residual right pleural  fluid. Underlying chronic lung disease. IMPRESSION: No definite right pneumothorax following thoracentesis. Large pulmonary bleb overlying the right lower chest. Electronically Signed   By: Marcey Moan M.D.   On: 06/21/2024 17:05   US  THORACENTESIS ASP PLEURAL SPACE W/IMG GUIDE Result Date: 06/21/2024 INDICATION: 68 year old male with history of bilateral lung cancer stage IV, now with bilateral pleural effusion, right side greater than left. IR was requested for diagnostic and therapeutic thoracentesis. EXAM: ULTRASOUND GUIDED RIGHT-SIDED DIAGNOSTIC AND THERAPEUTIC THORACENTESIS MEDICATIONS: 5 cc of 1% lidocaine  COMPLICATIONS: None immediate. PROCEDURE: An ultrasound guided thoracentesis was thoroughly discussed with the patient and questions answered. The benefits, risks, alternatives and complications were also discussed. The patient understands and wishes to proceed with the procedure. Written consent was obtained. Ultrasound was performed to localize and mark an adequate pocket of fluid in the right chest. The area was then prepped and draped in the normal sterile fashion. 1% Lidocaine  was used for local anesthesia. Under ultrasound guidance a 6 Fr Safe-T-Centesis catheter was introduced. Thoracentesis was performed. The catheter was removed and a dressing applied. FINDINGS: A total of approximately 700 mL of clear, straw-colored pleural fluid was removed. Samples were sent to the laboratory as requested by the clinical team. IMPRESSION: Successful ultrasound guided right thoracentesis yielding 700 mL of pleural fluid. Procedure performed by Carlin Griffon, PA-C Electronically Signed   By: Juliene Balder M.D.   On: 06/21/2024 14:52   CT CHEST ABDOMEN PELVIS W CONTRAST Addendum Date: 06/20/2024 ADDENDUM REPORT: 06/20/2024 10:16 ADDENDUM: Addendum to include severe changes of emphysema, omitted on the earlier same day report. Electronically Signed   By: Limin  Xu M.D.   On: 06/20/2024 10:16   Result Date:  06/20/2024 CLINICAL DATA:  Right lung adenocarcinoma. Restaging. * Tracking Code: BO * EXAM: CT CHEST, ABDOMEN, AND PELVIS WITH CONTRAST TECHNIQUE: Multidetector CT imaging of the chest, abdomen and pelvis was performed following the standard protocol during bolus administration of intravenous contrast. RADIATION DOSE REDUCTION: This exam was performed according to the departmental dose-optimization program which includes automated exposure control, adjustment of the mA and/or kV according to patient size and/or use of iterative reconstruction technique. CONTRAST:  OMNIPAQUE  IOHEXOL  300 MG/ML  SOLN COMPARISON:  CT chest, abdomen, and pelvis dated 05/3 FINDINGS: CT CHEST FINDINGS Cardiovascular: Left chest wall port terminates in the lower SVC. Normal heart size. No significant pericardial fluid/thickening. Great vessels  are normal in course and caliber. No central pulmonary emboli. Coronary artery calcifications. Mediastinum/Nodes: Imaged thyroid  gland without nodules meeting criteria for imaging follow-up by size. Normal esophagus. No pathologically enlarged axillary, supraclavicular, mediastinal, or hilar lymph nodes. Lungs/Pleura: The central airways are patent. Trace secretions layering within the trachea extending into bilateral bronchials. Mild peribronchial wall thickening. Diffuse interlobular septal thickening similar left apical scarring. There is new relaxation atelectasis of the right lower lobe. Interval decreased size of right middle lobe mass measuring 3.7 x 3.2 cm (2:43), previously 5.5 x 5.0 cm. The left lower lobe mass has increased in size, measuring 5.2 x 3.9 cm, previously 4.5 x 3.2 cm. Subpleural rounded consolidations in the right middle lobe (4:87) and right lower lobe (4:120) are new. Previously noted 4 mm right middle lobe nodule is not well seen on today's examination. No pneumothorax. New large right and trace left pleural effusions. Musculoskeletal: No acute or abnormal lytic or  blastic osseous lesions. Unchanged augmentation of T6 and T10 vertebral bodies. Similar chronic fracture of the bilateral scapulae. CT ABDOMEN PELVIS FINDINGS Hepatobiliary: Ill-defined subcentimeter hypodensity in segment 7/8 (2:56), too small to characterize but unchanged. No intra or extrahepatic biliary ductal dilation. Normal gallbladder. Pancreas: No focal lesions or main ductal dilation. Spleen: Normal in size without focal abnormality. Adrenals/Urinary Tract: Similar fullness of the adrenal glands, previously characterized as adenomas. No specific follow-up imaging recommended. No suspicious renal mass, calculi, or hydronephrosis. Similar scattered areas of bilateral renal cortical scarring. No focal bladder wall thickening. Stomach/Bowel: Normal appearance of the stomach. No evidence of bowel wall thickening, distention, or inflammatory changes. Normal appendix. Vascular/Lymphatic: Aortic atherosclerosis. Unchanged 12 mm periportal lymph node (2:63). Reproductive: Prostate is unremarkable. Other: No free fluid, fluid collection, or free air. Musculoskeletal: No acute or abnormal lytic or blastic osseous findings. Multilevel degenerative changes of the lumbar spine. 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 Small paraumbilical hernia contains a loop of nonobstructed small bowel. IMPRESSION: 1. Interval decreased size of right middle lobe mass and increased size of left lower lobe mass. 2. New pulmonary edema and large right and trace left pleural effusions with new subpleural rounded consolidations in the right middle and lower lobes, which may represent atelectasis, attention on follow-up. 3. Unchanged 12 mm periportal lymph node. 4. Small paraumbilical hernia contains a loop of nonobstructed small bowel. 5. Aortic Atherosclerosis (ICD10-I70.0). Coronary artery calcifications. Assessment for potential risk factor modification, dietary therapy or pharmacologic therapy may be warranted, if  clinically indicated. Electronically Signed: By: Limin  Xu M.D. On: 06/20/2024 09:07

## 2024-07-09 ENCOUNTER — Other Ambulatory Visit: Payer: Self-pay | Admitting: Pulmonary Disease

## 2024-07-10 ENCOUNTER — Ambulatory Visit
Admission: RE | Admit: 2024-07-10 | Discharge: 2024-07-10 | Disposition: A | Discharge status: Home / Self Care | Source: Ambulatory Visit | Attending: Radiation Oncology | Admitting: Radiation Oncology

## 2024-07-10 ENCOUNTER — Encounter: Payer: Self-pay | Admitting: Radiation Oncology

## 2024-07-10 VITALS — BP 129/79 | HR 90 | Temp 98.4°F | Resp 16

## 2024-07-10 DIAGNOSIS — C3491 Malignant neoplasm of unspecified part of right bronchus or lung: Secondary | ICD-10-CM | POA: Insufficient documentation

## 2024-07-10 DIAGNOSIS — Z510 Encounter for antineoplastic radiation therapy: Secondary | ICD-10-CM | POA: Diagnosis present

## 2024-07-10 DIAGNOSIS — D381 Neoplasm of uncertain behavior of trachea, bronchus and lung: Secondary | ICD-10-CM

## 2024-07-10 NOTE — Telephone Encounter (Signed)
 Pt with lung CA chronic cough. + Bronchitis

## 2024-07-10 NOTE — Progress Notes (Signed)
 Radiation Oncology Follow up Note   old patient new area left lower lobe progressive lung mass  Name: Austin Patterson   Date:   07/10/2024 MRN:  [id number] DOB: September 12, 1956    This 68 y.o. 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  He tolerated treatments well at that time..  Recent CT scan of the chest showed decrease in right middle lobe mass which we treated although progressive disease in the left lower lobe.  He has new pulmonary edema and a large right trace left pleural effusion.  He has been referred to radiology for consideration of palliative treatment to the progressive left lower lobe mass.  He is not having any cough hemoptysis or chest tightness.  COMPLICATIONS OF TREATMENT: none  FOLLOW UP COMPLIANCE: keeps appointments   PHYSICAL EXAM:  BP 129/79   Pulse 90   Temp 98.4 F (36.9 C) (Tympanic)   Resp 16 wheelchair-bound male on nasal oxygen  and in NAD. Well-developed well-nourished patient in NAD. HEENT reveals PERLA, EOMI, discs not visualized.  Oral cavity is clear. No oral mucosal lesions are identified. Neck is clear without evidence of cervical or supraclavicular adenopathy. Lungs are clear to A&P. Cardiac examination is essentially unremarkable with regular rate and rhythm without murmur rub or thrill. Abdomen is benign with no organomegaly or masses noted. Motor sensory and DTR levels are equal and symmetric in the upper and lower extremities. Cranial nerves II through XII are grossly  intact. Proprioception is intact. No peripheral adenopathy or edema is identified. No motor or sensory levels are noted. Crude visual fields are within normal range.  RADIOLOGY RESULTS: CT scan is reviewed compatible with above-stated findings  PLAN: At this time like to try SBRT 50 Gray in 5 fractions to the left lower lobe lung mass.  Risks and benefits of treatment including possible development of a cough fatigue slight skin reaction all were reviewed in detail with the patient.  We may have to adjust her doses depending on treatment planning parameters.  I have personally 7 ordered CT simulation for later this week.  I would like to take this opportunity to thank you for allowing me to participate in the care of your patient.[name], MD

## 2024-07-13 ENCOUNTER — Emergency Department

## 2024-07-13 ENCOUNTER — Inpatient Hospital Stay
Admission: EM | Admit: 2024-07-13 | Discharge: 2024-07-18 | DRG: 180 | Disposition: A | Discharge status: Home Health | Attending: Obstetrics and Gynecology | Admitting: Obstetrics and Gynecology

## 2024-07-13 ENCOUNTER — Other Ambulatory Visit: Payer: Self-pay

## 2024-07-13 ENCOUNTER — Ambulatory Visit
Admission: RE | Admit: 2024-07-13 | Discharge: 2024-07-13 | Disposition: A | Discharge status: Home / Self Care | Source: Ambulatory Visit | Attending: Radiation Oncology | Admitting: Radiation Oncology

## 2024-07-13 DIAGNOSIS — Z79899 Other long term (current) drug therapy: Secondary | ICD-10-CM

## 2024-07-13 DIAGNOSIS — Z87891 Personal history of nicotine dependence: Secondary | ICD-10-CM

## 2024-07-13 DIAGNOSIS — R5381 Other malaise: Secondary | ICD-10-CM | POA: Diagnosis present

## 2024-07-13 DIAGNOSIS — Z7982 Long term (current) use of aspirin: Secondary | ICD-10-CM

## 2024-07-13 DIAGNOSIS — Z9221 Personal history of antineoplastic chemotherapy: Secondary | ICD-10-CM

## 2024-07-13 DIAGNOSIS — J9621 Acute and chronic respiratory failure with hypoxia: Secondary | ICD-10-CM | POA: Diagnosis present

## 2024-07-13 DIAGNOSIS — Z8249 Family history of ischemic heart disease and other diseases of the circulatory system: Secondary | ICD-10-CM

## 2024-07-13 DIAGNOSIS — C3491 Malignant neoplasm of unspecified part of right bronchus or lung: Principal | ICD-10-CM

## 2024-07-13 DIAGNOSIS — F1010 Alcohol abuse, uncomplicated: Secondary | ICD-10-CM | POA: Diagnosis present

## 2024-07-13 DIAGNOSIS — Z1152 Encounter for screening for COVID-19: Secondary | ICD-10-CM

## 2024-07-13 DIAGNOSIS — Z955 Presence of coronary angioplasty implant and graft: Secondary | ICD-10-CM

## 2024-07-13 DIAGNOSIS — J918 Pleural effusion in other conditions classified elsewhere: Secondary | ICD-10-CM | POA: Diagnosis present

## 2024-07-13 DIAGNOSIS — J9601 Acute respiratory failure with hypoxia: Secondary | ICD-10-CM | POA: Diagnosis present

## 2024-07-13 DIAGNOSIS — I10 Essential (primary) hypertension: Secondary | ICD-10-CM | POA: Diagnosis present

## 2024-07-13 DIAGNOSIS — J9622 Acute and chronic respiratory failure with hypercapnia: Secondary | ICD-10-CM | POA: Diagnosis present

## 2024-07-13 DIAGNOSIS — C3490 Malignant neoplasm of unspecified part of unspecified bronchus or lung: Secondary | ICD-10-CM | POA: Diagnosis present

## 2024-07-13 DIAGNOSIS — J8410 Pulmonary fibrosis, unspecified: Secondary | ICD-10-CM | POA: Diagnosis present

## 2024-07-13 DIAGNOSIS — Z66 Do not resuscitate: Secondary | ICD-10-CM | POA: Diagnosis present

## 2024-07-13 DIAGNOSIS — Z7951 Long term (current) use of inhaled steroids: Secondary | ICD-10-CM

## 2024-07-13 DIAGNOSIS — E785 Hyperlipidemia, unspecified: Secondary | ICD-10-CM | POA: Diagnosis present

## 2024-07-13 DIAGNOSIS — J441 Chronic obstructive pulmonary disease with (acute) exacerbation: Secondary | ICD-10-CM | POA: Diagnosis present

## 2024-07-13 DIAGNOSIS — I3139 Other pericardial effusion (noninflammatory): Secondary | ICD-10-CM | POA: Diagnosis present

## 2024-07-13 DIAGNOSIS — I480 Paroxysmal atrial fibrillation: Secondary | ICD-10-CM | POA: Diagnosis present

## 2024-07-13 DIAGNOSIS — K219 Gastro-esophageal reflux disease without esophagitis: Secondary | ICD-10-CM | POA: Diagnosis present

## 2024-07-13 DIAGNOSIS — I2510 Atherosclerotic heart disease of native coronary artery without angina pectoris: Secondary | ICD-10-CM | POA: Diagnosis present

## 2024-07-13 DIAGNOSIS — J4489 Other specified chronic obstructive pulmonary disease: Secondary | ICD-10-CM | POA: Diagnosis present

## 2024-07-13 DIAGNOSIS — G8929 Other chronic pain: Secondary | ICD-10-CM | POA: Diagnosis present

## 2024-07-13 DIAGNOSIS — Z7902 Long term (current) use of antithrombotics/antiplatelets: Secondary | ICD-10-CM

## 2024-07-13 DIAGNOSIS — Z510 Encounter for antineoplastic radiation therapy: Secondary | ICD-10-CM | POA: Diagnosis not present

## 2024-07-13 DIAGNOSIS — J439 Emphysema, unspecified: Secondary | ICD-10-CM | POA: Diagnosis present

## 2024-07-13 DIAGNOSIS — R0600 Dyspnea, unspecified: Principal | ICD-10-CM

## 2024-07-13 DIAGNOSIS — R0602 Shortness of breath: Secondary | ICD-10-CM | POA: Diagnosis not present

## 2024-07-13 DIAGNOSIS — Z9981 Dependence on supplemental oxygen: Secondary | ICD-10-CM

## 2024-07-13 DIAGNOSIS — I959 Hypotension, unspecified: Secondary | ICD-10-CM | POA: Diagnosis present

## 2024-07-13 HISTORY — DX: Malignant (primary) neoplasm, unspecified: C80.1

## 2024-07-13 LAB — COMPREHENSIVE METABOLIC PANEL WITH GFR
ALT: 18 U/L (ref 0–44)
AST: 23 U/L (ref 15–41)
Albumin: 3.8 g/dL (ref 3.5–5.0)
Alkaline Phosphatase: 115 U/L (ref 38–126)
Anion gap: 11 (ref 5–15)
BUN: 13 mg/dL (ref 8–23)
CO2: 31 mmol/L (ref 22–32)
Calcium: 8.8 mg/dL — ABNORMAL LOW (ref 8.9–10.3)
Chloride: 95 mmol/L — ABNORMAL LOW (ref 98–111)
Creatinine, Ser: 0.86 mg/dL (ref 0.61–1.24)
GFR, Estimated: >60 mL/min (ref >60)
Glucose, Bld: 121 mg/dL — ABNORMAL HIGH (ref 70–99)
Potassium: 4 mmol/L (ref 3.5–5.1)
Sodium: 137 mmol/L (ref 135–145)
Total Bilirubin: 0.5 mg/dL (ref 0.0–1.2)
Total Protein: 6.9 g/dL (ref 6.5–8.1)

## 2024-07-13 LAB — CBC WITH DIFFERENTIAL/PLATELET
Abs Immature Granulocytes: 0.03 K/uL (ref 0.00–0.07)
Basophils Absolute: 0 K/uL (ref 0.0–0.1)
Basophils Relative: 0 %
Eosinophils Absolute: 0 K/uL (ref 0.0–0.5)
Eosinophils Relative: 0 %
HCT: 34.2 % — ABNORMAL LOW (ref 39.0–52.0)
Hemoglobin: 10.5 g/dL — ABNORMAL LOW (ref 13.0–17.0)
Immature Granulocytes: 1 %
Lymphocytes Relative: 8 %
Lymphs Abs: 0.4 K/uL — ABNORMAL LOW (ref 0.7–4.0)
MCH: 31.3 pg (ref 26.0–34.0)
MCHC: 30.7 g/dL (ref 30.0–36.0)
MCV: 101.8 fL — ABNORMAL HIGH (ref 80.0–100.0)
Monocytes Absolute: 0.9 K/uL (ref 0.1–1.0)
Monocytes Relative: 16 %
Neutro Abs: 3.9 K/uL (ref 1.7–7.7)
Neutrophils Relative %: 75 %
Platelets: 220 K/uL (ref 150–400)
RBC: 3.36 MIL/uL — ABNORMAL LOW (ref 4.22–5.81)
RDW: 18.2 % — ABNORMAL HIGH (ref 11.5–15.5)
WBC: 5.2 K/uL (ref 4.0–10.5)
nRBC: 0 % (ref 0.0–0.2)

## 2024-07-13 LAB — RESP PANEL BY RT-PCR (RSV, FLU A&B, COVID)  RVPGX2
Influenza A by PCR: NEGATIVE
Influenza B by PCR: NEGATIVE
Resp Syncytial Virus by PCR: NEGATIVE
SARS Coronavirus 2 by RT PCR: NEGATIVE

## 2024-07-13 LAB — PROTIME-INR
INR: 1 (ref 0.8–1.2)
Prothrombin Time: 14.3 s (ref 11.4–15.2)

## 2024-07-13 LAB — LACTIC ACID, PLASMA
Lactic Acid, Venous: 1 mmol/L (ref 0.5–1.9)
Lactic Acid, Venous: 1.4 mmol/L (ref 0.5–1.9)

## 2024-07-13 LAB — TROPONIN I (HIGH SENSITIVITY): Troponin I (High Sensitivity): 7 ng/L (ref <18)

## 2024-07-13 LAB — BRAIN NATRIURETIC PEPTIDE: B Natriuretic Peptide: 70.6 pg/mL (ref 0.0–100.0)

## 2024-07-13 MED ORDER — BUDESONIDE 0.5 MG/2ML IN SUSP
0.5000 mg | Freq: Two times a day (BID) | RESPIRATORY_TRACT | Status: AC
  Administered 2024-07-13 – 2024-07-15 (×4): 0.5 mg via RESPIRATORY_TRACT
  Filled 2024-07-13 (×4): qty 2

## 2024-07-13 MED ORDER — ATORVASTATIN CALCIUM 20 MG PO TABS
40.0000 mg | ORAL_TABLET | Freq: Every day | ORAL | Status: DC
  Administered 2024-07-13 – 2024-07-17 (×5): 40 mg via ORAL
  Filled 2024-07-13 (×5): qty 2

## 2024-07-13 MED ORDER — ENOXAPARIN SODIUM 40 MG/0.4ML IJ SOSY
40.0000 mg | PREFILLED_SYRINGE | INTRAMUSCULAR | Status: DC
  Administered 2024-07-13 – 2024-07-17 (×5): 40 mg via SUBCUTANEOUS
  Filled 2024-07-13 (×5): qty 0.4

## 2024-07-13 MED ORDER — IPRATROPIUM-ALBUTEROL 0.5-2.5 (3) MG/3ML IN SOLN
3.0000 mL | Freq: Once | RESPIRATORY_TRACT | Status: AC
Start: 2024-07-13 — End: 2024-07-13
  Administered 2024-07-13: 3 mL via RESPIRATORY_TRACT
  Filled 2024-07-13: qty 3

## 2024-07-13 MED ORDER — OXYCODONE HCL 5 MG PO TABS
5.0000 mg | ORAL_TABLET | Freq: Three times a day (TID) | ORAL | Status: DC | PRN
  Administered 2024-07-15 – 2024-07-17 (×4): 5 mg via ORAL
  Filled 2024-07-13 (×5): qty 1

## 2024-07-13 MED ORDER — AMLODIPINE BESYLATE 5 MG PO TABS
5.0000 mg | ORAL_TABLET | Freq: Every day | ORAL | Status: DC
Start: 2024-07-14 — End: 2024-07-14
  Filled 2024-07-13: qty 1

## 2024-07-13 MED ORDER — ACETAMINOPHEN 650 MG RE SUPP: 650.0000 mg | Freq: Four times a day (QID) | RECTAL | Status: DC | PRN

## 2024-07-13 MED ORDER — METOPROLOL SUCCINATE ER 50 MG PO TB24
50.0000 mg | ORAL_TABLET | Freq: Every day | ORAL | Status: DC
  Administered 2024-07-14 – 2024-07-18 (×5): 50 mg via ORAL
  Filled 2024-07-13 (×6): qty 1

## 2024-07-13 MED ORDER — ALBUTEROL SULFATE (2.5 MG/3ML) 0.083% IN NEBU: 2.5000 mg | INHALATION_SOLUTION | RESPIRATORY_TRACT | Status: DC | PRN

## 2024-07-13 MED ORDER — FUROSEMIDE 10 MG/ML IJ SOLN
40.0000 mg | Freq: Every day | INTRAMUSCULAR | Status: DC
  Administered 2024-07-13 – 2024-07-14 (×2): 40 mg via INTRAVENOUS
  Filled 2024-07-13 (×3): qty 4

## 2024-07-13 MED ORDER — IOHEXOL 350 MG/ML SOLN
75.0000 mL | Freq: Once | INTRAVENOUS | Status: AC | PRN
  Administered 2024-07-13: 75 mL via INTRAVENOUS

## 2024-07-13 MED ORDER — BISACODYL 5 MG PO TBEC: 5.0000 mg | DELAYED_RELEASE_TABLET | Freq: Every day | ORAL | Status: DC | PRN

## 2024-07-13 MED ORDER — NICOTINE 7 MG/24HR TD PT24: 7.0000 mg | MEDICATED_PATCH | Freq: Every day | TRANSDERMAL | Status: DC | PRN

## 2024-07-13 MED ORDER — METHYLPREDNISOLONE SODIUM SUCC 125 MG IJ SOLR
125.0000 mg | INTRAMUSCULAR | Status: AC
  Administered 2024-07-13: 125 mg via INTRAVENOUS
  Filled 2024-07-13: qty 2

## 2024-07-13 MED ORDER — ACETAMINOPHEN 325 MG PO TABS
650.0000 mg | ORAL_TABLET | Freq: Four times a day (QID) | ORAL | Status: DC | PRN
  Administered 2024-07-13 – 2024-07-18 (×7): 650 mg via ORAL
  Filled 2024-07-13 (×7): qty 2

## 2024-07-13 MED ORDER — MONTELUKAST SODIUM 10 MG PO TABS
10.0000 mg | ORAL_TABLET | Freq: Every day | ORAL | Status: DC
  Administered 2024-07-13 – 2024-07-17 (×5): 10 mg via ORAL
  Filled 2024-07-13 (×5): qty 1

## 2024-07-13 MED ORDER — ONDANSETRON HCL 4 MG/2ML IJ SOLN: 4.0000 mg | Freq: Four times a day (QID) | INTRAMUSCULAR | Status: DC | PRN

## 2024-07-13 MED ORDER — AZITHROMYCIN 500 MG PO TABS
500.0000 mg | ORAL_TABLET | Freq: Every day | ORAL | Status: DC
  Administered 2024-07-13 – 2024-07-14 (×2): 500 mg via ORAL
  Filled 2024-07-13 (×2): qty 1

## 2024-07-13 MED ORDER — ONDANSETRON HCL 4 MG PO TABS
4.0000 mg | ORAL_TABLET | Freq: Four times a day (QID) | ORAL | Status: DC | PRN
  Filled 2024-07-13: qty 1

## 2024-07-13 MED ORDER — ALUM & MAG HYDROXIDE-SIMETH 200-200-20 MG/5ML PO SUSP
30.0000 mL | ORAL | Status: DC | PRN
  Administered 2024-07-13: 30 mL via ORAL
  Filled 2024-07-13 (×2): qty 30

## 2024-07-13 MED ORDER — IPRATROPIUM-ALBUTEROL 0.5-2.5 (3) MG/3ML IN SOLN
3.0000 mL | RESPIRATORY_TRACT | Status: DC | PRN
  Administered 2024-07-14: 3 mL via RESPIRATORY_TRACT
  Filled 2024-07-13: qty 3

## 2024-07-13 MED ORDER — PREDNISONE 20 MG PO TABS
40.0000 mg | ORAL_TABLET | Freq: Every day | ORAL | Status: DC
  Administered 2024-07-14 – 2024-07-15 (×2): 40 mg via ORAL
  Filled 2024-07-13 (×2): qty 2

## 2024-07-13 MED ORDER — POLYETHYLENE GLYCOL 3350 17 G PO PACK
17.0000 g | PACK | Freq: Every day | ORAL | Status: DC
  Administered 2024-07-16 – 2024-07-17 (×2): 17 g via ORAL
  Filled 2024-07-13 (×5): qty 1

## 2024-07-13 NOTE — ED Notes (Signed)
 PT states he has been running around 88% 4L [place] at home for the past few weeks.

## 2024-07-13 NOTE — H&P (Signed)
 History and Physical  Austin Patterson FMW:[id number] DOB: 1955/12/27 DOA: 07/13/2024 PCP: Autry Grayce LABOR, PA  Chief Complaint: SOB Historian: patient  HPI:  Austin Patterson is a 68 y.o. male with a PMH significant for COPD, stage IV adenocarcinoma of lung, pleural effusion, pulmonary fibrosis, HTN, CAD, HLD, alcohol dependence, GERD, alpha 1 antitrypsin deficiency, PAF. At baseline, they live alone and are independent with ADLs.  They use a walker to ambulate in home and wheelchair for further distances due to SOB.   They presented from home to the ED on 07/13/2024 with SOB worsening x 2 days.  At baseline, they use 3 L nasal cannula of oxygen  continuously. He noticed that he was having increased dyspnea walking his usual distances to the bathroom and around the house. He says that his breathing treatments are the only thing keeping him going at this time and the results only last a short time. Denies CP, leg swelling, diaphoresis, fever. He admits to being non-adherent with his oxygen  at times when ambulating in his home due to the cumbersome nature of hauling an oxygen  tank with him.  He had a fitting today for radiation and is scheduled to start 5 days of palliative radiation on Oct 6.   In the ED, it was found that they had new O2 requirement increased to 5 L to maintain oxygen  saturations greater than 88% and otherwise stable vital signs. Significant findings included: Na+ 137, K+ 4, Cl- 95, creatinine 0.86.  WBC 5.2, hemoglobin 10.5, platelets 220.  BNP 70.6.  Troponin 7.  LA 1.0> 1.4.  Blood cultures collected.  VBG: pH 7.31, pCO2 70, bicarb 35.2.  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  Stable right lung mass middle lobe with surrounding consolidation.  Moderate right and small left pleural effusions. He denies tobacco use and states that he is not a regular EtOH  consumer. Last drink was about 2 weeks ago.   They were initially treated with DuoNebs, Solu-Medrol .   Patient was admitted to medicine service for further workup and management of acute on chronic hypercarbic respiratory failure as outlined in detail below.  Assessment/Plan Stage IV adenocarcinoma of the lung-undergoing palliative radiation therapy.  Last saw radiation oncology on 07/10/2024. Had fitting today to begin palliative radiation treatment on Oct 6 for 10 sessions (5 days).  Of note, patient underwent therapeutic thoracentesis on 9/3 with 0.7 L fluid removed. Acute on chronic hypercarbic respiratory failure-baseline is 3 L now requiring 5 L at rest and with minimal exertion needs further increase of oxygen  supply. At time of interview- on 7Lnc due to exertion of sitting up in bed to use urinal.  Acute COPD exacerbation-Home medications include albuterol , DuoNebs, Singulair , dupilumab .  S/p Solu-Medrol  and DuoNebs in the ED - Continue DuoNebs, budesonide  inhalers - Prednisone  p.o. x 4 days - Azithromycin  p.o. x 4 days - Wean oxygen  as tolerated. Ambulate with pulse ox.  - Follow-up with radiation oncology and palliative - Consider IR consult for thoracentesis versus PleurX placement for recurrent pleural effusions.  Effusion on chest x-ray today appears to be decreased from that of imaging on 9/3 when he underwent thoracentesis - Continue home Lasix  converted to IV. He requested a purewick since it causes him severe respiratory distress to sit up and use bedside urinal.   CAD  HLD- - Continue atorvastatin  - Holding home aspirin , Plavix  in case he will  go for therapeutic thoracentesis this admission  HTN-pressures elevated on presentation - Continue home amlodipine , metoprolol   Past Medical History:  Diagnosis Date   Abdominal pain 12/07/2022   Acute urinary retention 12/07/2022   Cancer (HCC)    Chest pain 01/24/2022   COPD (chronic obstructive pulmonary disease) (HCC)     GERD (gastroesophageal reflux disease)    Hemothorax on right 05/11/2023   Hyperlipidemia    Hypertension    Pleuritic pain 05/10/2023   Pneumothorax after biopsy 05/11/2023   Post procedure discomfort 05/13/2023   Pulmonary fibrosis (HCC) 11/2015    Past Surgical History:  Procedure Laterality Date   COLONOSCOPY     CORONARY STENT PLACEMENT     ESOPHAGOGASTRODUODENOSCOPY (EGD) WITH PROPOFOL  N/A 09/23/2016   Procedure: ESOPHAGOGASTRODUODENOSCOPY (EGD) WITH PROPOFOL ;  Surgeon: Ruel Kung, MD;  Location: [facility];  Service: Endoscopy;  Laterality: N/A;   IR IMAGING GUIDED PORT INSERTION  05/26/2023   IR IMAGING GUIDED PORT INSERTION  07/30/2023   IR RADIOLOGIST EVAL & MGMT  07/20/2023   KYPHOPLASTY N/A 03/14/2020   Procedure: T7 & T11 KYPHOPLASTY;  Surgeon: Kathlynn Sharper, MD;  Location: [facility] ORS;  Service: Orthopedics;  Laterality: N/A;   PORT-A-CATH REMOVAL N/A 06/14/2023   Procedure: REMOVAL PORT-A-CATH;  Surgeon: Tye Millet, DO;  Location: [facility] ORS;  Service: General;  Laterality: N/A;   SHOULDER ACROMIOPLASTY       reports that he quit smoking about 8 years ago. His smoking use included cigarettes. He started smoking about 58 years ago. He has a 100 pack-year smoking history. He has quit using smokeless tobacco. He reports current alcohol use of about 56.0 standard drinks of alcohol per week. He reports that he does not use drugs.  Allergies  Allergen Reactions   Amoxicillin  Anaphylaxis   Tizanidine      Feet and ankle swell     Family History  Problem Relation Age of Onset   Heart disease Mother     Prior to Admission medications   Medication Sig Start Date End Date Taking? Authorizing Provider  acetaminophen  (TYLENOL ) 500 MG tablet Take 1,000 mg by mouth every 6 (six) hours as needed for moderate pain or headache.    [provider]  albuterol  (PROVENTIL ) (2.5 MG/3ML) 0.083% nebulizer solution Take 2.5 mg by nebulization every 4 (four) hours. 05/23/24    [provider]  albuterol  (VENTOLIN  HFA) 108 (90 Base) MCG/ACT inhaler Inhale 2 puffs into the lungs every 6 (six) hours as needed for wheezing or shortness of breath. 01/29/20   Danford, Lonni SQUIBB, MD  amLODipine  (NORVASC ) 5 MG tablet Take 1 tablet (5 mg total) by mouth at bedtime. Skip the dose if systolic BP less than 130 mmHg 06/22/23 07/10/24  Von Bellis, MD  aspirin  EC 81 MG tablet Take 1 tablet (81 mg total) by mouth daily. 08/19/16   Chaplin, Don C, MD  atorvastatin  (LIPITOR) 40 MG tablet Take 1 tablet (40 mg total) by mouth at bedtime. 08/19/16   Berl Todd BROCKS, MD  CALCIUM  600/VITAMIN D  600-10 MG-MCG TABS Take 1 tablet by mouth 2 (two) times daily. 11/20/21   [provider]  clopidogrel  (PLAVIX ) 75 MG tablet Take 1 tablet (75 mg total) by mouth daily. 08/19/16   Chaplin, Don C, MD  Dupilumab  (DUPIXENT ) 300 MG/2ML SOAJ Inject 300 mg into the skin every 14 (fourteen) days. 06/05/24   Tamea Dedra CROME, MD  EPINEPHrine  0.3 mg/0.3 mL IJ SOAJ injection Inject 0.3 mg into the muscle as needed for anaphylaxis.  [provider]  ferrous sulfate  325 (65 FE) MG tablet Take 1 tablet (325 mg total) by mouth 2 (two) times daily with a meal. 06/22/23 07/10/24  Von Bellis, MD  furosemide  (LASIX ) 20 MG tablet Take 1 tablet (20 mg total) by mouth daily. 07/03/24   Babara Call, MD  gabapentin  (NEURONTIN ) 300 MG capsule TAKE 1 CAPSULE BY MOUTH THREE TIMES A DAY 04/18/24   Babara Call, MD  guaiFENesin -codeine  100-10 MG/5ML syrup TAKE 10 ML BY MOUTH 3 TIMES A DAY AS NEEDED FOR COUGH 07/10/24   Tamea Dedra CROME, MD  ipratropium-albuterol  (DUONEB) 0.5-2.5 (3) MG/3ML SOLN INHALE 1 VIAL THROUGH NEBULIZER EVERY 6 HOURS 05/01/24   Tamea Dedra CROME, MD  lidocaine -prilocaine  (EMLA ) cream Apply to affected area once 04/03/24   Babara Call, MD  magnesium  oxide (MAG-OX) 400 (241.3 Mg) MG tablet Take 1 tablet (400 mg total) by mouth daily. 06/18/17   Sudini, Srikar, MD  metoprolol  succinate (TOPROL -XL) 50  MG 24 hr tablet Take 50 mg by mouth daily. 01/24/24   [provider]  montelukast  (SINGULAIR ) 10 MG tablet Take 10 mg by mouth daily. 11/19/21   [provider]  Multiple Vitamin (MULTIVITAMIN WITH MINERALS) TABS tablet Take 1 tablet by mouth daily. 09/24/16   Patel, Sona, MD  omega-3 acid ethyl esters (LOVAZA) 1 g capsule Take 1 g by mouth daily.    [provider]  oxyCODONE  (OXY IR/ROXICODONE ) 5 MG immediate release tablet Take 1 tablet (5 mg total) by mouth every 8 (eight) hours as needed for severe pain (pain score 7-10). 05/22/24   Babara Call, MD  pantoprazole  (PROTONIX ) 40 MG tablet Take 1 tablet (40 mg total) by mouth 2 (two) times daily. 09/23/16   Patel, Sona, MD  potassium chloride  SA (KLOR-CON  M) 20 MEQ tablet Take 20 mEq by mouth daily. 10/05/23   [provider]  predniSONE  (DELTASONE ) 10 MG tablet TAKE 1 TABLET (10 MG TOTAL) BY MOUTH DAILY AS NEEDED. TAKE AS DIRECTED. 04/17/24   Tamea Dedra CROME, MD  Spacer/Aero-Holding Chambers (AEROCHAMBER MV) inhaler Use as instructed 10/05/19   Tamea Dedra CROME, MD  sucralfate  (CARAFATE ) 1 g tablet Take 1 tablet (1 g total) by mouth 3 (three) times daily before meals. Dissolve in 4tbs warm water , swish and swallow Patient not taking: Reported on 07/03/2024 01/17/24   Lenn Aran, MD   I have personally, briefly reviewed patient's prior medical records in [facility]  Objective: Blood pressure (!) 159/78, pulse 89, temperature (!) 97.5 F (36.4 C), temperature source Oral, resp. rate (!) 22, height 5' 7 (1.702 m), weight 85.3 kg, SpO2 97%.   Constitutional: NAD, calm, comfortable HEENT: lids and conjunctivae normal. MMM. Posterior pharynx clear of any exudate or lesions. Normal dentition.  Neck: normal, supple, no masses, no thyromegaly Respiratory: inspiratory diffuse wheezing, mild crackles on right and decreased lung sounds at bases bilaterally. Normal respiratory effort. No accessory muscle use.   Cardiovascular: RRR, no murmurs / rubs / gallops. No extremity edema. 2+ pedal pulses. no clubbing / cyanosis.  Abdomen: soft, NT, ND, no masses or HSM palpated. Musculoskeletal: No joint deformity upper and lower extremities. Normal muscle tone.  Skin: dry, intact, normal color, normal temperature on exposed skin Neurologic: Alert and oriented x 3. Normal speech. Grossly non-focal exam. PERRL Psychiatric: Normal mood. Congruent affect.  Labs on Admission: I have personally reviewed admission labs and imaging studies  CBC    Component Value Date/Time   WBC 5.2 07/13/2024 1335  RBC 3.36 (L) 07/13/2024 1335   HGB 10.5 (L) 07/13/2024 1335   HGB 10.3 (L) 07/03/2024 0901   HGB 12.2 (L) 06/27/2012 0834   HCT 34.2 (L) 07/13/2024 1335   HCT 36.2 (L) 06/27/2012 0834   PLT 220 07/13/2024 1335   PLT 246 07/03/2024 0901   PLT 396 06/27/2012 0834   MCV 101.8 (H) 07/13/2024 1335   MCV 79 (L) 06/27/2012 0834   MCH 31.3 07/13/2024 1335   MCHC 30.7 07/13/2024 1335   RDW 18.2 (H) 07/13/2024 1335   RDW 20.2 (H) 06/27/2012 0834   LYMPHSABS 0.4 (L) 07/13/2024 1335   MONOABS 0.9 07/13/2024 1335   EOSABS 0.0 07/13/2024 1335   BASOSABS 0.0 07/13/2024 1335   CMP     Component Value Date/Time   NA 137 07/13/2024 1335   NA 133 (L) 06/27/2012 0834   K 4.0 07/13/2024 1335   K 4.2 06/27/2012 0834   CL 95 (L) 07/13/2024 1335   CL 102 06/27/2012 0834   CO2 31 07/13/2024 1335   CO2 24 06/27/2012 0834   GLUCOSE 121 (H) 07/13/2024 1335   GLUCOSE 119 (H) 06/27/2012 0834   BUN 13 07/13/2024 1335   BUN 9 06/27/2012 0834   CREATININE 0.86 07/13/2024 1335   CREATININE 0.67 07/03/2024 0901   CREATININE 0.69 06/27/2012 0834   CALCIUM  8.8 (L) 07/13/2024 1335   CALCIUM  9.2 06/27/2012 0834   PROT 6.9 07/13/2024 1335   ALBUMIN 3.8 07/13/2024 1335   AST 23 07/13/2024 1335   AST 22 07/03/2024 0901   ALT 18 07/13/2024 1335   ALT 20 07/03/2024 0901   ALKPHOS 115 07/13/2024 1335   BILITOT 0.5 07/13/2024  1335   BILITOT 0.5 07/03/2024 0901   GFRNONAA >60 07/13/2024 1335   GFRNONAA >60 07/03/2024 0901   GFRNONAA >60 06/27/2012 0834   GFRAA >60 03/24/2020 0917   GFRAA >60 06/27/2012 0834    Radiological Exams on Admission: CT Angio Chest PE W and/or Wo Contrast Result Date: 07/13/2024 CLINICAL DATA:  Pulmonary embolism suspected with high probability. Decreased oxygen  saturation after supplemental oxygen  ran out. History of lung cancer. EXAM: CT ANGIOGRAPHY CHEST WITH CONTRAST TECHNIQUE: Multidetector CT imaging of the chest was performed using the standard protocol during bolus administration of intravenous contrast. Multiplanar CT image reconstructions and MIPs were obtained to evaluate the vascular anatomy. RADIATION DOSE REDUCTION: This exam was performed according to the departmental dose-optimization program which includes automated exposure control, adjustment of the mA and/or kV according to patient size and/or use of iterative reconstruction technique. CONTRAST:  75mL OMNIPAQUE  IOHEXOL  350 MG/ML SOLN COMPARISON:  Chest radiograph 07/13/2024. CT chest abdomen and pelvis 06/12/2024 FINDINGS: Cardiovascular: Technically adequate study with good opacification of the central and segmental pulmonary arteries. Mild to moderate motion artifact. No focal filling defects are demonstrated in the pulmonary arteries. No evidence of significant pulmonary embolus. Mild cardiac enlargement with small pericardial effusion. Normal caliber thoracic aorta. No aortic dissection. Calcification of the aorta and coronary arteries. Great vessel origins are patent. Mediastinum/Nodes: Central venous catheter with tip in the low SVC. Esophagus is decompressed. Mediastinal lymph nodes are not pathologically enlarged. Thyroid  gland is unremarkable. Lungs/Pleura: Moderate right and small left pleural effusions, similar to prior study. Diffuse emphysematous changes in the lungs. Large bulla in the right lung base. Right middle  lung collapse and consolidation with right middle lung mass measuring 4.4 cm diameter. This measures slightly larger than on the prior study but there may be increased atelectasis in the area.  Atelectasis in the lung bases. Mass suggested in the left medial costophrenic angle measuring about 4 cm diameter. This is similar to previous study. Nodular interstitial septal thickening throughout the lungs but more prominent on the right. This could represent edema, fibrosis, or interstitial tumor spread. No change since prior study. Upper Abdomen: No acute abnormalities. Musculoskeletal: Compression deformities post kyphoplasty at T6 and T10 levels. Degenerative changes in the spine. Old rib fractures. No acute bony abnormalities are appreciated. Review of the MIP images confirms the above findings. IMPRESSION: 1. No evidence of significant pulmonary embolus. 2. Right middle lung mass lesion and left costophrenic angle mass lesion appears grossly unchanged. Adjacent collapse and consolidation in the right middle lung demonstrates mild progression since prior study. 3. Diffuse interstitial pattern to the lungs of nonspecific etiology, similar to prior study. 4. Moderate right and small left pleural effusions are unchanged. Basilar atelectasis in the lungs. 5. No developing metastatic disease is appreciated. 6. Cardiac enlargement with small pericardial effusion. 7. Aortic atherosclerosis. 8. Emphysematous changes in the lungs with large bulla in the right base. Electronically Signed   By: Elsie Gravely M.D.   On: 07/13/2024 17:08   DG Chest Port 1 View Result Date: 07/13/2024 EXAM: 1 VIEW(S) XRAY OF THE CHEST 07/13/2024 02:08:37 PM COMPARISON: 06/21/2024 CLINICAL HISTORY: Questionable sepsis - evaluate for abnormality. Questionable sepsis FINDINGS: LINES, TUBES AND DEVICES: Left chest port with tip in SVC. LUNGS AND PLEURA: Small right pleural effusion. Right middle lobe lung mass is again noted with adjacent large  emphysematous bulla as noted on prior PET CT. There are diffuse nodular interstitial opacities throughout both lungs which appeared unchanged from 06/21/2024. Diffuse chronic interstitial markings are again noted which appear unchanged from 06/21/2024. No pulmonary edema. No pneumothorax. HEART AND MEDIASTINUM: No acute abnormality of the cardiac and mediastinal silhouettes. BONES AND SOFT TISSUES: No acute osseous abnormality. IMPRESSION: 1. Right middle lobe lung mass with adjacent large emphysematous bulla, as noted on prior PET CT. 2. Small right pleural effusion. increased. 3. Diffuse nodular interstitial opacities throughout both lungs, unchanged from 06/21/2024. Electronically signed by: Waddell Calk MD 07/13/2024 02:50 PM EDT RP Workstation: [id number]   EKG: Independently reviewed. NSR HR 87  DVT prophylaxis: heparin   Code Status: DNI. Patient states that he would want chest compressions in setting of cardiac arrest but would not want to be intubated if in respiratory arrest- just let me go  Family Communication: none at bedside   Disposition Plan: admit to obs  Consults called: none    Austin LITTIE Piety, DO [facility]  07/13/2024, 6:45 PM    To contact the appropriate [facility] Attending or Consulting provider: Check [email or web address] for coverage from 7pm-7am

## 2024-07-13 NOTE — ED Notes (Signed)
 Pt's oxygen  saturation decreased to 68% when moving from ED stretcher to CT table while wearing 5L [place]. After a moment the pt's oxygen  increased to 94% 5L [place].  Pt went to move himself from CT table to ED stretcher while wearing 5L [place] causing his oxygen  to decrease to 88%. Once pt back on ED stretcher his oxygen  increased back to 94%. EDP made aware.

## 2024-07-13 NOTE — ED Triage Notes (Signed)
 Pt BIB [facility] from home after returning home from CT scan. Pt was on his travel oxygen  tank. When EMS arrived oxygen  saturation was 77% on 4L [place].  Pt came to 98% 8L [place] & 1 duo neb. 158/66 Pt has lung cancer.

## 2024-07-13 NOTE — ED Notes (Signed)
 Pt states he doesn't want to take the Lasix  until he gets a bed upstairs. Educated pt on risks & benefits of medication.

## 2024-07-13 NOTE — ED Provider Notes (Signed)
 Care of this patient assumed from prior physician at 1530 pending CT of the chest, reevaluation, anticipate admission. Please see prior physician note for further details.  Briefly, this is a 68 year old male with history of COPD, lung cancer presenting for acute onset of shortness of breath.  Diminished breath sounds with wheezing and crackles on presentation.  X-[place] with small right pleural effusion and stable interstitial opacities.  CT of the chest ordered to further evaluate for pulmonary embolism.  Anticipate likely admission for COPD exacerbation versus pulmonary embolism.  CT of the chest resulted without significant PE.  Right lung mass lesion with adjacent collapse and consolidation with mild progression from prior study.  Bilateral pleural effusions noted.  On reassessment, patient continues to feel short of breath.  On 5 L O2 with mildly increased work of breathing.  Lung sounds are diminished, but no significant wheezing noted.  Updated on results of workup.  Do think he remains appropriate for admission.  He denies any new fever, cough, sputum change, so we will defer on antibiotics for now given lower clinical suspicion for pneumonia.  Will reach out to hospitalist team to discuss admission.  [id number] Case discussed with hospitalist team.  They will evaluate for anticipated admission.   Levander Slate, MD 07/13/24 [phone]

## 2024-07-13 NOTE — ED Provider Notes (Signed)
 [facility] Provider Note    Event Date/Time   First MD Initiated Contact with Patient 07/13/24 1332     (approximate)   History   Shortness of Breath   HPI  Austin Patterson is a 68 y.o. male history of COPD, fibrosis and lung cancer  Patient reports he had mapping for CT for further radiation today.  After leaving the clinic he suddenly felt extremely short of breath with trying to walk  He reports that he feels like he just cannot get off oxygen  his lungs this has been a recurring issue but seems to be steadily getting worse.  He has not had any fevers or chills or cough.  He uses oxygen  at home     Physical Exam   Triage Vital Signs: ED Triage Vitals  Encounter Vitals Group     BP 07/13/24 1330 135/73     Girls Systolic BP Percentile --      Girls Diastolic BP Percentile --      Boys Systolic BP Percentile --      Boys Diastolic BP Percentile --      Pulse Rate 07/13/24 1330 93     Resp 07/13/24 1330 (!) 22     Temp 07/13/24 1330 (!) 97.5 F (36.4 C)     Temp Source 07/13/24 1330 Oral     SpO2 07/13/24 1329 (!) 89 %     Weight 07/13/24 1329 188 lb (85.3 kg)     Height 07/13/24 1329 5' 7 (1.702 m)     Head Circumference --      Peak Flow --      Pain Score 07/13/24 1328 5     Pain Loc --      Pain Education --      Exclude from Growth Chart --     Most recent vital signs: Vitals:   07/13/24 1400 07/13/24 1430  BP: (!) 141/128 137/69  Pulse: 84 94  Resp: 19 (!) 21  Temp:    SpO2: 98% 99%     General: Awake, no distress.  Just slightly tachypneic but in no acute distress CV:  Good peripheral perfusion.  Normal tone Resp:  Diminished lung sounds in the bases bilateral slightly more so over the right lung.  Left lower lung crackles and wheezing, right lower lung slight wheezing or crackles as well.  Speaks in phrases, mild accessory muscle use no tripoding. Abd:  No distention.  Soft nontender Other:  Trace bilateral lower  extremity edema   ED Results / Procedures / Treatments   Labs (all labs ordered are listed, but only abnormal results are displayed) Labs Reviewed  COMPREHENSIVE METABOLIC PANEL WITH GFR - Abnormal; Notable for the following components:      Result Value   Chloride 95 (*)    Glucose, Bld 121 (*)    Calcium  8.8 (*)    All other components within normal limits  CBC WITH DIFFERENTIAL/PLATELET - Abnormal; Notable for the following components:   RBC 3.36 (*)    Hemoglobin 10.5 (*)    HCT 34.2 (*)    MCV 101.8 (*)    RDW 18.2 (*)    Lymphs Abs 0.4 (*)    All other components within normal limits  BLOOD GAS, VENOUS - Abnormal; Notable for the following components:   pCO2, Ven 70 (*)    Bicarbonate 35.2 (*)    Acid-Base Excess 6.4 (*)    All other components within normal limits  RESP PANEL BY RT-PCR (RSV, FLU A&B, COVID)  RVPGX2  CULTURE, BLOOD (ROUTINE X 2)  CULTURE, BLOOD (ROUTINE X 2)  LACTIC ACID, PLASMA  PROTIME-INR  BRAIN NATRIURETIC PEPTIDE  LACTIC ACID, PLASMA  TROPONIN I (HIGH SENSITIVITY)   Normal white count.  Normal lactic.  BNP normal arguing against CHF.  EKG  Interpreted by me at 1332 heart rate 90 QRS 130 QTc 480 Right bundle branch block.  Sinus rhythm.  No frank ischemia   RADIOLOGY  Chest x-ray interpreted by me as probable chronic scarring like findings and small pleural effusion on the right.  Also appears to be bleb  DG Chest [facility] 1 View Result Date: 07/13/2024 EXAM: 1 VIEW(S) XRAY OF THE CHEST 07/13/2024 02:08:37 PM COMPARISON: 06/21/2024 CLINICAL HISTORY: Questionable sepsis - evaluate for abnormality. Questionable sepsis FINDINGS: LINES, TUBES AND DEVICES: Left chest port with tip in SVC. LUNGS AND PLEURA: Small right pleural effusion. Right middle lobe lung mass is again noted with adjacent large emphysematous bulla as noted on prior PET CT. There are diffuse nodular interstitial opacities throughout both lungs which appeared unchanged from  06/21/2024. Diffuse chronic interstitial markings are again noted which appear unchanged from 06/21/2024. No pulmonary edema. No pneumothorax. HEART AND MEDIASTINUM: No acute abnormality of the cardiac and mediastinal silhouettes. BONES AND SOFT TISSUES: No acute osseous abnormality. IMPRESSION: 1. Right middle lobe lung mass with adjacent large emphysematous bulla, as noted on prior PET CT. 2. Small right pleural effusion. increased. 3. Diffuse nodular interstitial opacities throughout both lungs, unchanged from 06/21/2024. Electronically signed by: Waddell Calk MD 07/13/2024 02:50 PM EDT RP Workstation: [id number]      PROCEDURES:  Critical Care performed: No  Procedures   MEDICATIONS ORDERED IN ED: Medications  ipratropium-albuterol  (DUONEB) 0.5-2.5 (3) MG/3ML nebulizer solution 3 mL (3 mLs Nebulization Given 07/13/24 1407)  ipratropium-albuterol  (DUONEB) 0.5-2.5 (3) MG/3ML nebulizer solution 3 mL (3 mLs Nebulization Given 07/13/24 1400)  methylPREDNISolone  sodium succinate (SOLU-MEDROL ) 125 mg/2 mL injection 125 mg (125 mg Intravenous Given 07/13/24 1400)  iohexol  (OMNIPAQUE ) 350 MG/ML injection 75 mL (75 mLs Intravenous Contrast Given 07/13/24 1551)     IMPRESSION / MDM / ASSESSMENT AND PLAN / ED COURSE  I reviewed the triage vital signs and the nursing notes.                              Differential diagnosis includes, but is not limited to, COPD exacerbation, pneumothorax, symptomatic pleural effusion, thromboembolism ACS etc.  Will initiate nebulizers and steroids.  Lung sounds and clinical history make for complicated potential causes for diagnosis could be multifactorial as well.  Patient improving with rest oxygenation improving, on DuoNebs been helpful he is resting comfortably as of about 3:30 PM.  Patient's presentation is most consistent with acute complicated illness / injury requiring diagnostic workup.   Ongoing care assigned to Dr. Levander. CT PE study pending. Anticipate  admission, but further workup and CT scan will be helpful       FINAL CLINICAL IMPRESSION(S) / ED DIAGNOSES   Final diagnoses:  Dyspnea, unspecified type     Rx / DC Orders   ED Discharge Orders     None        Note:  This document was prepared using Dragon voice recognition software and may include unintentional dictation errors.   Dicky Anes, MD 07/13/24 [id number]

## 2024-07-14 ENCOUNTER — Observation Stay: Admit: 2024-07-14

## 2024-07-14 ENCOUNTER — Observation Stay
Admit: 2024-07-14 | Discharge: 2024-07-14 | Disposition: A | Discharge status: Home / Self Care | Attending: Obstetrics and Gynecology | Admitting: Obstetrics and Gynecology

## 2024-07-14 ENCOUNTER — Inpatient Hospital Stay

## 2024-07-14 DIAGNOSIS — E785 Hyperlipidemia, unspecified: Secondary | ICD-10-CM | POA: Diagnosis present

## 2024-07-14 DIAGNOSIS — Z87891 Personal history of nicotine dependence: Secondary | ICD-10-CM | POA: Diagnosis not present

## 2024-07-14 DIAGNOSIS — J441 Chronic obstructive pulmonary disease with (acute) exacerbation: Secondary | ICD-10-CM | POA: Diagnosis present

## 2024-07-14 DIAGNOSIS — R5381 Other malaise: Secondary | ICD-10-CM | POA: Diagnosis present

## 2024-07-14 DIAGNOSIS — I3139 Other pericardial effusion (noninflammatory): Secondary | ICD-10-CM | POA: Diagnosis present

## 2024-07-14 DIAGNOSIS — J9622 Acute and chronic respiratory failure with hypercapnia: Secondary | ICD-10-CM | POA: Diagnosis present

## 2024-07-14 DIAGNOSIS — I959 Hypotension, unspecified: Secondary | ICD-10-CM | POA: Diagnosis present

## 2024-07-14 DIAGNOSIS — R0609 Other forms of dyspnea: Secondary | ICD-10-CM

## 2024-07-14 DIAGNOSIS — Z66 Do not resuscitate: Secondary | ICD-10-CM | POA: Diagnosis present

## 2024-07-14 DIAGNOSIS — J9601 Acute respiratory failure with hypoxia: Secondary | ICD-10-CM | POA: Diagnosis not present

## 2024-07-14 DIAGNOSIS — J90 Pleural effusion, not elsewhere classified: Secondary | ICD-10-CM | POA: Diagnosis not present

## 2024-07-14 DIAGNOSIS — F1010 Alcohol abuse, uncomplicated: Secondary | ICD-10-CM | POA: Diagnosis present

## 2024-07-14 DIAGNOSIS — I10 Essential (primary) hypertension: Secondary | ICD-10-CM | POA: Diagnosis present

## 2024-07-14 DIAGNOSIS — K219 Gastro-esophageal reflux disease without esophagitis: Secondary | ICD-10-CM | POA: Diagnosis present

## 2024-07-14 DIAGNOSIS — I2510 Atherosclerotic heart disease of native coronary artery without angina pectoris: Secondary | ICD-10-CM | POA: Diagnosis present

## 2024-07-14 DIAGNOSIS — J918 Pleural effusion in other conditions classified elsewhere: Secondary | ICD-10-CM | POA: Diagnosis present

## 2024-07-14 DIAGNOSIS — Z955 Presence of coronary angioplasty implant and graft: Secondary | ICD-10-CM | POA: Diagnosis not present

## 2024-07-14 DIAGNOSIS — Z8249 Family history of ischemic heart disease and other diseases of the circulatory system: Secondary | ICD-10-CM | POA: Diagnosis not present

## 2024-07-14 DIAGNOSIS — J8410 Pulmonary fibrosis, unspecified: Secondary | ICD-10-CM | POA: Diagnosis present

## 2024-07-14 DIAGNOSIS — Z7902 Long term (current) use of antithrombotics/antiplatelets: Secondary | ICD-10-CM | POA: Diagnosis not present

## 2024-07-14 DIAGNOSIS — Z1152 Encounter for screening for COVID-19: Secondary | ICD-10-CM | POA: Diagnosis not present

## 2024-07-14 DIAGNOSIS — I480 Paroxysmal atrial fibrillation: Secondary | ICD-10-CM | POA: Diagnosis present

## 2024-07-14 DIAGNOSIS — G8929 Other chronic pain: Secondary | ICD-10-CM | POA: Diagnosis present

## 2024-07-14 DIAGNOSIS — J439 Emphysema, unspecified: Secondary | ICD-10-CM | POA: Diagnosis present

## 2024-07-14 DIAGNOSIS — J9621 Acute and chronic respiratory failure with hypoxia: Secondary | ICD-10-CM | POA: Diagnosis present

## 2024-07-14 DIAGNOSIS — C3491 Malignant neoplasm of unspecified part of right bronchus or lung: Secondary | ICD-10-CM | POA: Diagnosis present

## 2024-07-14 DIAGNOSIS — R0602 Shortness of breath: Secondary | ICD-10-CM | POA: Diagnosis present

## 2024-07-14 DIAGNOSIS — Z9981 Dependence on supplemental oxygen: Secondary | ICD-10-CM | POA: Diagnosis not present

## 2024-07-14 LAB — BASIC METABOLIC PANEL WITH GFR
Anion gap: 12 (ref 5–15)
BUN: 13 mg/dL (ref 8–23)
CO2: 32 mmol/L (ref 22–32)
Calcium: 8.5 mg/dL — ABNORMAL LOW (ref 8.9–10.3)
Chloride: 94 mmol/L — ABNORMAL LOW (ref 98–111)
Creatinine, Ser: 0.89 mg/dL (ref 0.61–1.24)
GFR, Estimated: >60 mL/min (ref >60)
Glucose, Bld: 143 mg/dL — ABNORMAL HIGH (ref 70–99)
Potassium: 4.2 mmol/L (ref 3.5–5.1)
Sodium: 138 mmol/L (ref 135–145)

## 2024-07-14 LAB — BODY FLUID CELL COUNT WITH DIFFERENTIAL
Eos, Fluid: 0 %
Lymphs, Fluid: 7 %
Monocyte-Macrophage-Serous Fluid: 84 %
Neutrophil Count, Fluid: 9 %
Total Nucleated Cell Count, Fluid: 131 uL

## 2024-07-14 LAB — ECHOCARDIOGRAM COMPLETE
AR max vel: 2.41 cm2
AV Area VTI: 2.63 cm2
AV Area mean vel: 2.32 cm2
AV Mean grad: 4 mmHg
AV Peak grad: 6.9 mmHg
Ao pk vel: 1.31 m/s
Area-P 1/2: 4.93 cm2
Height: 67 in
S' Lateral: 3.6 cm
Weight: 3008 [oz_av]

## 2024-07-14 LAB — CBC
HCT: 31.7 % — ABNORMAL LOW (ref 39.0–52.0)
Hemoglobin: 9.8 g/dL — ABNORMAL LOW (ref 13.0–17.0)
MCH: 30.6 pg (ref 26.0–34.0)
MCHC: 30.9 g/dL (ref 30.0–36.0)
MCV: 99.1 fL (ref 80.0–100.0)
Platelets: 214 K/uL (ref 150–400)
RBC: 3.2 MIL/uL — ABNORMAL LOW (ref 4.22–5.81)
RDW: 17.7 % — ABNORMAL HIGH (ref 11.5–15.5)
WBC: 4.3 K/uL (ref 4.0–10.5)
nRBC: 0 % (ref 0.0–0.2)

## 2024-07-14 LAB — BLOOD GAS, VENOUS

## 2024-07-14 LAB — PROTEIN, PLEURAL OR PERITONEAL FLUID: Total protein, fluid: 3.7 g/dL

## 2024-07-14 LAB — LACTATE DEHYDROGENASE, PLEURAL OR PERITONEAL FLUID: LD, Fluid: 126 U/L — ABNORMAL HIGH (ref 3–23)

## 2024-07-14 LAB — ALBUMIN, PLEURAL OR PERITONEAL FLUID: Albumin, Fluid: 2.6 g/dL

## 2024-07-14 MED ORDER — GABAPENTIN 300 MG PO CAPS
300.0000 mg | ORAL_CAPSULE | Freq: Three times a day (TID) | ORAL | Status: DC
  Administered 2024-07-14 – 2024-07-18 (×12): 300 mg via ORAL
  Filled 2024-07-14 (×13): qty 1

## 2024-07-14 MED ORDER — MELATONIN 5 MG PO TABS
5.0000 mg | ORAL_TABLET | Freq: Every evening | ORAL | Status: DC | PRN
  Administered 2024-07-14 – 2024-07-16 (×3): 5 mg via ORAL
  Filled 2024-07-14 (×3): qty 1

## 2024-07-14 MED ORDER — ARFORMOTEROL TARTRATE 15 MCG/2ML IN NEBU
15.0000 ug | INHALATION_SOLUTION | Freq: Two times a day (BID) | RESPIRATORY_TRACT | Status: DC
  Administered 2024-07-14 – 2024-07-18 (×7): 15 ug via RESPIRATORY_TRACT
  Filled 2024-07-14 (×9): qty 2

## 2024-07-14 MED ORDER — PANTOPRAZOLE SODIUM 40 MG PO TBEC
40.0000 mg | DELAYED_RELEASE_TABLET | Freq: Two times a day (BID) | ORAL | Status: DC
  Administered 2024-07-14 – 2024-07-18 (×9): 40 mg via ORAL
  Filled 2024-07-14 (×9): qty 1

## 2024-07-14 MED ORDER — BUDESONIDE 0.25 MG/2ML IN SUSP: 0.2500 mg | Freq: Two times a day (BID) | RESPIRATORY_TRACT | Status: DC

## 2024-07-14 NOTE — Progress Notes (Signed)
*  PRELIMINARY RESULTS* Echocardiogram 2D Echocardiogram has been performed.  Austin Patterson 07/14/2024, 2:40 PM

## 2024-07-14 NOTE — Evaluation (Signed)
 Physical Therapy Evaluation Patient Details Name: Austin Patterson MRN: [id number] DOB: 1956/06/21 Today's Date: 07/14/2024  History of Present Illness  Pt is a 68 y/o M presenting to ED with c/o SOB with activity, desatting to 88% on home O2. CT negative for PE. PMH significant for COPD on 3L at baseline, pulmonary fibrosis, stage IV adenocarcinoma, pleural effusion, HTN, CAD, HLD, alcohol dependence, afib.   Clinical Impression  Pt A&Ox4, agreeable to PT evaluation, denied pain throughout session. At baseline, pt reports being modI with rollator use for household amb, IND with most basic ADLs. Pt was met supine in bed, minA to assist BLE out of bed. Pt amb ~81ft in room with RW, no LOB, VC to maintain BOS within RW frame. Pt reported fatigue with short bout of amb. SpO2 monitored during session with pt on 4L, 90% at rest but desat to 80% after amb, improved to 88% at end of session. Pt left seated in recliner, all needs in reach. Pt is displaying deficits in activity tolerance and balance limiting his functional mobility and would benefit from skilled PT intervention to address listed deficits and improve independence with mobility/ADLs.        If plan is discharge home, recommend the following: A little help with walking and/or transfers;A little help with bathing/dressing/bathroom;Assistance with cooking/housework;Assist for transportation;Help with stairs or ramp for entrance   Can travel by private vehicle   Yes    Equipment Recommendations Other (comment) (TBD)  Recommendations for Other Services  OT consult    Functional Status Assessment Patient has had a recent decline in their functional status and demonstrates the ability to make significant improvements in function in a reasonable and predictable amount of time.     Precautions / Restrictions Precautions Precautions: Fall Recall of Precautions/Restrictions: Intact Restrictions Weight Bearing Restrictions Per Provider  Order: No      Mobility  Bed Mobility Overal bed mobility: Needs Assistance Bed Mobility: Supine to Sit     Supine to sit: Min assist, HOB elevated, Used rails     General bed mobility comments: minA to assist BLE out of bed    Transfers Overall transfer level: Needs assistance Equipment used: Rolling walker (2 wheels) Transfers: Sit to/from Stand Sit to Stand: Contact guard assist           General transfer comment: STS from EOB with no physical assistance, VC for hand placement on RW    Ambulation/Gait Ambulation/Gait assistance: Contact guard assist Gait Distance (Feet): 12 Feet Assistive device: Rolling walker (2 wheels) Gait Pattern/deviations: Step-through pattern, Trunk flexed, Wide base of support Gait velocity: decreased     General Gait Details: no LOB, VC to maintain BOS within RW frame, fatigued after short bout of ambulation  Stairs            Wheelchair Mobility     Tilt Bed    Modified Rankin (Stroke Patients Only)       Balance Overall balance assessment: Needs assistance Sitting-balance support: Feet supported Sitting balance-Leahy Scale: Fair Sitting balance - Comments: increased posterior lean when donning socks in sitting   Standing balance support: Bilateral upper extremity supported, Reliant on assistive device for balance Standing balance-Leahy Scale: Poor Standing balance comment: heavy BUE support on RW                             Pertinent Vitals/Pain Pain Assessment Pain Assessment: No/denies pain    Home Living Family/patient  expects to be discharged to:: Private residence Living Arrangements: Alone Available Help at Discharge: Family;Available PRN/intermittently Type of Home: House Home Access: Ramped entrance       Home Layout: One level Home Equipment: Cane - single point;Rollator (4 wheels);Rolling Walker (2 wheels);Shower seat      Prior Function Prior Level of Function :  Independent/Modified Independent             Mobility Comments: pt cited using rollator for mobility, requires 3L O2 at baseline. States he mainly walks household distances ADLs Comments: IND with basic ADLs, has some help for housework and cleaning when family stops by     Extremity/Trunk Assessment   Upper Extremity Assessment Upper Extremity Assessment: Overall WFL for tasks assessed    Lower Extremity Assessment Lower Extremity Assessment: Generalized weakness       Communication   Communication Communication: No apparent difficulties    Cognition Arousal: Alert Behavior During Therapy: WFL for tasks assessed/performed   PT - Cognitive impairments: No apparent impairments                       PT - Cognition Comments: A&Ox4 Following commands: Intact       Cueing Cueing Techniques: Verbal cues     General Comments      Exercises Other Exercises Other Exercises: SpO2 monitored during session with pt on 4L [place] throughout: 90% at rest, 88% sitting EOB, in low 80s after ambulation but improved to 88% by end of session with PLB   Assessment/Plan    PT Assessment Patient needs continued PT services  PT Problem List Decreased strength;Decreased activity tolerance;Decreased balance;Decreased mobility;Decreased knowledge of use of DME;Cardiopulmonary status limiting activity       PT Treatment Interventions DME instruction;Gait training;Functional mobility training;Therapeutic activities;Therapeutic exercise;Balance training;Neuromuscular re-education;Patient/family education    PT Goals (Current goals can be found in the Care Plan section)  Acute Rehab PT Goals Patient Stated Goal: to get better PT Goal Formulation: With patient Time For Goal Achievement: 07/28/24 Potential to Achieve Goals: Fair    Frequency Min 2X/week     Co-evaluation               AM-PAC PT 6 Clicks Mobility  Outcome Measure Help needed turning from your back to  your side while in a flat bed without using bedrails?: A Little Help needed moving from lying on your back to sitting on the side of a flat bed without using bedrails?: A Little Help needed moving to and from a bed to a chair (including a wheelchair)?: A Little Help needed standing up from a chair using your arms (e.g., wheelchair or bedside chair)?: A Little Help needed to walk in hospital room?: A Little Help needed climbing 3-5 steps with a railing? : A Lot 6 Click Score: 17    End of Session Equipment Utilized During Treatment: Gait belt;Oxygen  Activity Tolerance: Patient limited by fatigue Patient left: in chair;with call bell/phone within reach (echo in room) Nurse Communication: Mobility status PT Visit Diagnosis: Unsteadiness on feet (R26.81);Other abnormalities of gait and mobility (R26.89);Muscle weakness (generalized) (M62.81);Difficulty in walking, not elsewhere classified (R26.2)    Time: 1349-1411 PT Time Calculation (min) (ACUTE ONLY): 22 min   Charges:   PT Evaluation $PT Eval Low Complexity: 1 Low PT Treatments $Therapeutic Activity: 8-22 mins PT General Charges $$ ACUTE PT VISIT: 1 Visit         Janell Axe, SPT

## 2024-07-14 NOTE — Progress Notes (Signed)
 PROGRESS NOTE    Austin Patterson  FMW:[id number] DOB: 09-14-1956 DOA: 07/13/2024 PCP: Autry Grayce LABOR, PA  Outpatient Specialists: oncology, pulmonology    Brief Narrative:   From admission h and p  Austin Patterson is a 68 y.o. male with a PMH significant for COPD, stage IV adenocarcinoma of lung, pleural effusion, pulmonary fibrosis, HTN, CAD, HLD, alcohol dependence, GERD, alpha 1 antitrypsin deficiency, PAF. At baseline, they live alone and are independent with ADLs.  They use a walker to ambulate in home and wheelchair for further distances due to SOB.    They presented from home to the ED on 07/13/2024 with SOB worsening x 2 days.  At baseline, they use 3 L nasal cannula of oxygen  continuously. He noticed that he was having increased dyspnea walking his usual distances to the bathroom and around the house. He says that his breathing treatments are the only thing keeping him going at this time and the results only last a short time. Denies CP, leg swelling, diaphoresis, fever. He admits to being non-adherent with his oxygen  at times when ambulating in his home due to the cumbersome nature of hauling an oxygen  tank with him.  He had a fitting today for radiation and is scheduled to start 5 days of palliative radiation on Oct 6.    In the ED, it was found that they had new O2 requirement increased to 5 L to maintain oxygen  saturations greater than 88% and otherwise stable vital signs. Significant findings included: Na+ 137, K+ 4, Cl- 95, creatinine 0.86.  WBC 5.2, hemoglobin 10.5, platelets 220.  BNP 70.6.  Troponin 7.  LA 1.0> 1.4.  Blood cultures collected.  VBG: pH 7.31, pCO2 70, bicarb 35.2.  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  Stable right lung mass middle lobe with surrounding consolidation.  Moderate right and small left pleural effusions. He denies tobacco  use and states that he is not a regular EtOH consumer. Last drink was about 2 weeks ago.    They were initially treated with DuoNebs, Solu-Medrol .    Patient was admitted to medicine service for further workup and management of acute on chronic hypercarbic respiratory failure as outlined in detail below.   Assessment & Plan:   Principal Problem:   Acute hypoxemic respiratory failure (HCC) Active Problems:   Acute on chronic respiratory failure with hypoxia (HCC)   CAD (coronary artery disease)   Pulmonary fibrosis (HCC)   Essential hypertension   Asthma-COPD overlap syndrome (HCC)   Alcohol abuse   Stage IV adenocarcinoma of lung (HCC)   Paroxysmal atrial fibrillation (HCC)  # Acute on chronic hypoxic respiratory failure Baseline is 2 liters, here requiring 5. Multifactorial as below. - [place] O2, wean as able  # Pleural effusion 2/2 malignancy. Contributing to above subacute respiratory failure - will review repeat thoracentesis, pleurx catheter, etc. W/ pulm and oncology today  # COPD End-stage per last pulm note. Doesn't describe acute worsening such as wheeze, cough, etc. Nothing acute on CTA. - continue steroids for now - d/c abx  # Pericardial effusion On CT. Bnp low. Does have hx cad. - will update TTE  # Stage 4 lung cancer On maintenance gemcitabine  currently, with plans to start radiation therapy next month - will touch base with oncology  # Debility Lives alone, endorses worsening weakness, ambulates with a walker at baseline - PT consult  #  Alcohol abuse  # CAD S/p stent in 2009. No chest pain - tte as above - hold plavix  for now pending possible procedure - hold asa as well  # HTN Borderline hypotensive - hold home amlodipine , lasix   # Chronic pain - home gabapentin , oxycodone   # A-fib Rate controlled, not anticoagulated - home metop   DVT prophylaxis: lovenox  Code Status: dnr/dni Family Communication: none at bedside  Level of care:  Med-Surg Status is: Observation    Consultants:  None thus far  Procedures: None thus far  Antimicrobials:  S/p azithromycin     Subjective: Reports feeling fine lying in bed  Objective: Vitals:   07/13/24 2024 07/13/24 2036 07/14/24 0418 07/14/24 0838  BP:   (!) 95/53 137/74  Pulse:   87 79  Resp:   16 18  Temp:   98.5 F (36.9 C) 97.6 F (36.4 C)  TempSrc:      SpO2: (!) 86% 92% 97% 92%  Weight:      Height:        Intake/Output Summary (Last 24 hours) at 07/14/2024 0856 Last data filed at 07/13/2024 2345 Gross per 24 hour  Intake 480 ml  Output 1400 ml  Net -920 ml   Filed Weights   07/13/24 1329  Weight: 85.3 kg    Examination:  General exam: Appears calm and comfortable  Respiratory system: rales at bases, decreased right mid and base Cardiovascular system: S1 & S2 heard, RR Gastrointestinal system: Abdomen is nondistended, soft and nontender.  Central nervous system: Alert and oriented. No focal neurological deficits. Extremities: Symmetric 5 x 5 power. Skin: No rashes, lesions or ulcers Psychiatry: Judgement and insight appear normal. Mood & affect appropriate.     Data Reviewed: I have personally reviewed following labs and imaging studies  CBC: Recent Labs  Lab 07/13/24 1335 07/14/24 0430  WBC 5.2 4.3  NEUTROABS 3.9  --   HGB 10.5* 9.8*  HCT 34.2* 31.7*  MCV 101.8* 99.1  PLT 220 214   Basic Metabolic Panel: Recent Labs  Lab 07/13/24 1335 07/14/24 0430  NA 137 138  K 4.0 4.2  CL 95* 94*  CO2 31 32  GLUCOSE 121* 143*  BUN 13 13  CREATININE 0.86 0.89  CALCIUM  8.8* 8.5*   GFR: Estimated Creatinine Clearance: 82.9 mL/min (by C-G formula based on SCr of 0.89 mg/dL). Liver Function Tests: Recent Labs  Lab 07/13/24 1335  AST 23  ALT 18  ALKPHOS 115  BILITOT 0.5  PROT 6.9  ALBUMIN 3.8   No results for input(s): LIPASE, AMYLASE in the last 168 hours. No results for input(s): AMMONIA in the last 168  hours. Coagulation Profile: Recent Labs  Lab 07/13/24 1335  INR 1.0   Cardiac Enzymes: No results for input(s): CKTOTAL, CKMB, CKMBINDEX, TROPONINI in the last 168 hours. BNP (last 3 results) No results for input(s): PROBNP in the last 8760 hours. HbA1C: No results for input(s): HGBA1C in the last 72 hours. CBG: No results for input(s): GLUCAP in the last 168 hours. Lipid Profile: No results for input(s): CHOL, HDL, LDLCALC, TRIG, CHOLHDL, LDLDIRECT in the last 72 hours. Thyroid  Function Tests: No results for input(s): TSH, T4TOTAL, FREET4, T3FREE, THYROIDAB in the last 72 hours. Anemia Panel: No results for input(s): VITAMINB12, FOLATE, FERRITIN, TIBC, IRON , RETICCTPCT in the last 72 hours. Urine analysis:    Component Value Date/Time   COLORURINE YELLOW (A) 08/06/2023 1145   APPEARANCEUR CLEAR (A) 08/06/2023 1145   LABSPEC 1.019 08/06/2023 1145  PHURINE 6.0 08/06/2023 1145   GLUCOSEU NEGATIVE 08/06/2023 1145   HGBUR NEGATIVE 08/06/2023 1145   BILIRUBINUR NEGATIVE 08/06/2023 1145   KETONESUR NEGATIVE 08/06/2023 1145   PROTEINUR NEGATIVE 08/06/2023 1145   NITRITE NEGATIVE 08/06/2023 1145   LEUKOCYTESUR NEGATIVE 08/06/2023 1145   Sepsis Labs: @LABRCNTIP (procalcitonin:4,lacticidven:4)  ) Recent Results (from the past 240 hours)  Blood Culture (routine x 2)     Status: None (Preliminary result)   Collection Time: 07/13/24  2:26 PM   Specimen: BLOOD  Result Value Ref Range Status   Specimen Description BLOOD BLOOD RIGHT ARM  Final   Special Requests   Final    BOTTLES DRAWN AEROBIC AND ANAEROBIC Blood Culture adequate volume   Culture   Final    NO GROWTH < 24 HOURS Performed at [facility], [address], [place], [place] [address]    Report Status PENDING  Incomplete  Blood Culture (routine x 2)     Status: None (Preliminary result)   Collection Time: 07/13/24  2:26 PM   Specimen: BLOOD  Result Value  Ref Range Status   Specimen Description BLOOD BLOOD LEFT ARM  Final   Special Requests   Final    BOTTLES DRAWN AEROBIC AND ANAEROBIC Blood Culture adequate volume   Culture   Final    NO GROWTH < 24 HOURS Performed at [facility], [address], [place], [place] [address]    Report Status PENDING  Incomplete  Resp panel by RT-PCR (RSV, Flu A&B, Covid) Anterior Nasal Swab     Status: None   Collection Time: 07/13/24  2:26 PM   Specimen: Anterior Nasal Swab  Result Value Ref Range Status   SARS Coronavirus 2 by RT PCR NEGATIVE NEGATIVE Final    Comment: (NOTE) SARS-CoV-2 target nucleic acids are NOT DETECTED.  The SARS-CoV-2 RNA is generally detectable in upper respiratory specimens during the acute phase of infection. The lowest concentration of SARS-CoV-2 viral copies this assay can detect is 138 copies/mL. A negative result does not preclude SARS-Cov-2 infection and should not be used as the sole basis for treatment or other patient management decisions. A negative result may occur with  improper specimen collection/handling, submission of specimen other than nasopharyngeal swab, presence of viral mutation(s) within the areas targeted by this assay, and inadequate number of viral copies(<138 copies/mL). A negative result must be combined with clinical observations, patient history, and epidemiological information. The expected result is Negative.  Fact Sheet for Patients:  BloggerCourse.com  Fact Sheet for Healthcare Providers:  SeriousBroker.it  This test is no t yet approved or cleared by the [place]  FDA and  has been authorized for detection and/or diagnosis of SARS-CoV-2 by FDA under an Emergency Use Authorization (EUA). This EUA will remain  in effect (meaning this test can be used) for the duration of the COVID-19 declaration under Section 564(b)(1) of the Act, 21 U.S.C.section 360bbb-3(b)(1), unless  the authorization is terminated  or revoked sooner.       Influenza A by PCR NEGATIVE NEGATIVE Final   Influenza B by PCR NEGATIVE NEGATIVE Final    Comment: (NOTE) The Xpert Xpress SARS-CoV-2/FLU/RSV plus assay is intended as an aid in the diagnosis of influenza from Nasopharyngeal swab specimens and should not be used as a sole basis for treatment. Nasal washings and aspirates are unacceptable for Xpert Xpress SARS-CoV-2/FLU/RSV testing.  Fact Sheet for Patients: BloggerCourse.com  Fact Sheet for Healthcare Providers: SeriousBroker.it  This test is not yet approved or cleared  by the [place]  FDA and has been authorized for detection and/or diagnosis of SARS-CoV-2 by FDA under an Emergency Use Authorization (EUA). This EUA will remain in effect (meaning this test can be used) for the duration of the COVID-19 declaration under Section 564(b)(1) of the Act, 21 U.S.C. section 360bbb-3(b)(1), unless the authorization is terminated or revoked.     Resp Syncytial Virus by PCR NEGATIVE NEGATIVE Final    Comment: (NOTE) Fact Sheet for Patients: BloggerCourse.com  Fact Sheet for Healthcare Providers: SeriousBroker.it  This test is not yet approved or cleared by the [place]  FDA and has been authorized for detection and/or diagnosis of SARS-CoV-2 by FDA under an Emergency Use Authorization (EUA). This EUA will remain in effect (meaning this test can be used) for the duration of the COVID-19 declaration under Section 564(b)(1) of the Act, 21 U.S.C. section 360bbb-3(b)(1), unless the authorization is terminated or revoked.  Performed at [facility], [address], [place], [place] [address]          Radiology Studies: CT Angio Chest PE W and/or Wo Contrast Result Date: 07/13/2024 CLINICAL DATA:  Pulmonary embolism suspected with high probability.  Decreased oxygen  saturation after supplemental oxygen  ran out. History of lung cancer. EXAM: CT ANGIOGRAPHY CHEST WITH CONTRAST TECHNIQUE: Multidetector CT imaging of the chest was performed using the standard protocol during bolus administration of intravenous contrast. Multiplanar CT image reconstructions and MIPs were obtained to evaluate the vascular anatomy. RADIATION DOSE REDUCTION: This exam was performed according to the departmental dose-optimization program which includes automated exposure control, adjustment of the mA and/or kV according to patient size and/or use of iterative reconstruction technique. CONTRAST:  75mL OMNIPAQUE  IOHEXOL  350 MG/ML SOLN COMPARISON:  Chest radiograph 07/13/2024. CT chest abdomen and pelvis 06/12/2024 FINDINGS: Cardiovascular: Technically adequate study with good opacification of the central and segmental pulmonary arteries. Mild to moderate motion artifact. No focal filling defects are demonstrated in the pulmonary arteries. No evidence of significant pulmonary embolus. Mild cardiac enlargement with small pericardial effusion. Normal caliber thoracic aorta. No aortic dissection. Calcification of the aorta and coronary arteries. Great vessel origins are patent. Mediastinum/Nodes: Central venous catheter with tip in the low SVC. Esophagus is decompressed. Mediastinal lymph nodes are not pathologically enlarged. Thyroid  gland is unremarkable. Lungs/Pleura: Moderate right and small left pleural effusions, similar to prior study. Diffuse emphysematous changes in the lungs. Large bulla in the right lung base. Right middle lung collapse and consolidation with right middle lung mass measuring 4.4 cm diameter. This measures slightly larger than on the prior study but there may be increased atelectasis in the area. Atelectasis in the lung bases. Mass suggested in the left medial costophrenic angle measuring about 4 cm diameter. This is similar to previous study. Nodular interstitial  septal thickening throughout the lungs but more prominent on the right. This could represent edema, fibrosis, or interstitial tumor spread. No change since prior study. Upper Abdomen: No acute abnormalities. Musculoskeletal: Compression deformities post kyphoplasty at T6 and T10 levels. Degenerative changes in the spine. Old rib fractures. No acute bony abnormalities are appreciated. Review of the MIP images confirms the above findings. IMPRESSION: 1. No evidence of significant pulmonary embolus. 2. Right middle lung mass lesion and left costophrenic angle mass lesion appears grossly unchanged. Adjacent collapse and consolidation in the right middle lung demonstrates mild progression since prior study. 3. Diffuse interstitial pattern to the lungs of nonspecific etiology, similar to prior study. 4. Moderate right and small left pleural effusions are unchanged.  Basilar atelectasis in the lungs. 5. No developing metastatic disease is appreciated. 6. Cardiac enlargement with small pericardial effusion. 7. Aortic atherosclerosis. 8. Emphysematous changes in the lungs with large bulla in the right base. Electronically Signed   By: Elsie Gravely M.D.   On: 07/13/2024 17:08   DG Chest Port 1 View Result Date: 07/13/2024 EXAM: 1 VIEW(S) XRAY OF THE CHEST 07/13/2024 02:08:37 PM COMPARISON: 06/21/2024 CLINICAL HISTORY: Questionable sepsis - evaluate for abnormality. Questionable sepsis FINDINGS: LINES, TUBES AND DEVICES: Left chest port with tip in SVC. LUNGS AND PLEURA: Small right pleural effusion. Right middle lobe lung mass is again noted with adjacent large emphysematous bulla as noted on prior PET CT. There are diffuse nodular interstitial opacities throughout both lungs which appeared unchanged from 06/21/2024. Diffuse chronic interstitial markings are again noted which appear unchanged from 06/21/2024. No pulmonary edema. No pneumothorax. HEART AND MEDIASTINUM: No acute abnormality of the cardiac and mediastinal  silhouettes. BONES AND SOFT TISSUES: No acute osseous abnormality. IMPRESSION: 1. Right middle lobe lung mass with adjacent large emphysematous bulla, as noted on prior PET CT. 2. Small right pleural effusion. increased. 3. Diffuse nodular interstitial opacities throughout both lungs, unchanged from 06/21/2024. Electronically signed by: Waddell Calk MD 07/13/2024 02:50 PM EDT RP Workstation: [id number]        Scheduled Meds:  atorvastatin   40 mg Oral QHS   azithromycin   500 mg Oral Daily   budesonide  (PULMICORT ) nebulizer solution  0.5 mg Nebulization BID   enoxaparin  (LOVENOX ) injection  40 mg Subcutaneous Q24H   furosemide   40 mg Intravenous Daily   metoprolol  succinate  50 mg Oral Daily   montelukast   10 mg Oral Daily   polyethylene glycol  17 g Oral Daily   predniSONE   40 mg Oral Q breakfast   Continuous Infusions:   LOS: 0 days     Devaughn KATHEE Ban, MD [facility]   If 7PM-7AM, please contact night-coverage [email or web address] Password [email or web address] 07/14/2024, 8:56 AM

## 2024-07-14 NOTE — Procedures (Signed)
 Thoracentesis  Procedure Note  GLENDELL FOUSE  [id number]  Mar 16, 1956  Date:07/14/24  Time:4:31 PM   Provider Performing:Jean-Pierre Vaunda Gutterman   Procedure: Thoracentesis with imaging guidance (67444)  Indication(s) Pleural Effusion  Consent Risks of the procedure as well as the alternatives and risks of each were explained to the patient and/or caregiver.  Consent for the procedure was obtained and is signed in the bedside chart  Anesthesia Topical only with 1% lidocaine     Time Out Verified patient identification, verified procedure, site/side was marked, verified correct patient position, special equipment/implants available, medications/allergies/relevant history reviewed, required imaging and test results available.   Sterile Technique Maximal sterile technique including full sterile barrier drape, hand hygiene, sterile gown, sterile gloves, mask, hair covering, sterile ultrasound probe cover (if used).  Procedure Description Ultrasound was used to identify appropriate pleural anatomy for placement and overlying skin marked.  Area of drainage cleaned and draped in sterile fashion. Lidocaine  was used to anesthetize the skin and subcutaneous tissue.  1200 cc's of serous appearing fluid was drained from the right pleural space. Catheter then removed and bandaid applied to site.   Complications/Tolerance None; patient tolerated the procedure well.  Chest X-ray is ordered to confirm no post-procedural complication.  EBL Minimal  Specimen(s) Pleural fluid  Darrin Barn, MD [facility] 07/14/2024 4:32 PM

## 2024-07-14 NOTE — TOC Initial Note (Signed)
 Transition of Care [facility]) - Initial/Assessment Note    Patient Details  Name: Austin Patterson MRN: [id number] Date of Birth: 1956/02/22  [facility] CM/SW Contact:    Dalia GORMAN Fuse, RN Phone Number: 07/14/2024, 8:57 AM  Clinical Narrative:                 TOC completed review of the medical record. Patient is from home with his wife. No TOC needs at this time, please outreach to [facility] if needs are identified.        Patient Goals and CMS Choice            Expected Discharge Plan and Services                                              Prior Living Arrangements/Services                       Activities of Daily Living   ADL Screening (condition at time of admission) Independently performs ADLs?: Yes (appropriate for developmental age) Is the patient deaf or have difficulty hearing?: No Does the patient have difficulty seeing, even when wearing glasses/contacts?: No Does the patient have difficulty concentrating, remembering, or making decisions?: No  Permission Sought/Granted                  Emotional Assessment              Admission diagnosis:  Acute hypoxemic respiratory failure (HCC) [J96.01] Dyspnea, unspecified type [R06.00] Patient Active Problem List   Diagnosis Date Noted   Acute hypoxemic respiratory failure (HCC) 07/13/2024   Pleural effusion 06/20/2024   Thrombocytopenia 03/23/2024   Anemia due to antineoplastic chemotherapy 01/27/2024   Encounter for antineoplastic chemotherapy 01/06/2024   Hemoptysis 12/29/2023   Hypocalcemia 09/06/2023   Overweight (BMI 25.0-29.9) 08/08/2023   SIRS (systemic inflammatory response syndrome) (HCC) 08/06/2023   Cancer related pain 07/05/2023   Paroxysmal atrial fibrillation (HCC) 06/16/2023   Sepsis (HCC) 06/12/2023   Macrocytosis 06/08/2023   Stage IV adenocarcinoma of lung (HCC) 05/18/2023   Swelling of right foot 05/11/2023   Heterozygous alpha 1-antitrypsin  deficiency (HCC) 04/27/2023   Cough 04/03/2023   Alcohol use 04/03/2023   Constipation 12/10/2022   COPD exacerbation (HCC) 02/23/2022   GERD without esophagitis 02/23/2022   Alpha-1-antitrypsin deficiency carrier 01/10/2021   History of nonmelanoma skin cancer 12/19/2020   Asthma 03/12/2020   Chronic respiratory failure with hypoxia (HCC) 03/12/2020   Hyperlipidemia    Asthma-COPD overlap syndrome (HCC)    Bleeding nose    Alcohol abuse    RUQ abdominal pain    Anaphylaxis 11/01/2019   Hypotension 11/01/2019   CAD (coronary artery disease) 11/01/2019   H/O heart artery stent 11/01/2019   Acute on chronic respiratory failure with hypoxia (HCC)    RUQ pain    Essential hypertension    Pulmonary fibrosis (HCC) 08/30/2019   Hypomagnesemia 03/31/2017   Hiatal hernia    Reflux esophagitis    Hematemesis without nausea    Hyponatremia 09/21/2016   Multifocal pneumonia 12/09/2015   PCP:  Autry Grayce LABOR, PA Pharmacy:   [facility] [phone] [place], [place] - [address] AT [facility] [address] [place] [address] Phone: [phone] Fax: [phone]  [facility] - [place], [place] - [address] [place] [address] Phone: [phone] Fax: [phone]  [facility] [place], [place] - [address] [place] [address] Phone: [phone] Fax: [phone]  [facility] - [place] - [address] [place] [address] Phone: [phone] Fax: [phone]     Social Drivers of Health (SDOH) Social History: SDOH Screenings   Food Insecurity: No Food Insecurity (07/13/2024)  Housing: Low Risk  (07/13/2024)  Transportation Needs: No Transportation Needs (07/13/2024)  Utilities: Not At Risk (07/13/2024)  Depression (PHQ2-9): Low Risk  (07/10/2024)  Social Connections: Socially Isolated (07/13/2024)  Tobacco Use:  Medium Risk (07/13/2024)   SDOH Interventions:     Readmission Risk Interventions    06/14/2023   11:19 AM  Readmission Risk Prevention Plan  Transportation Screening Complete  Medication Review (RN Care Manager) Complete  PCP or Specialist appointment within 3-5 days of discharge Complete  SW Recovery Care/Counseling Consult Complete  Palliative Care Screening Not Applicable  Skilled Nursing Facility Not Applicable

## 2024-07-14 NOTE — Consult Note (Signed)
 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  Status post thoracentesis on 06/21/2024 with fluid studies consistent with an exudate.  Cytology negative for malignancy.  He presented to [facility] on 09/26 with worsening dyspnea and shortness of breath.  CTA of the chest ruled out PE.  It showed moderate right pleural effusion and small left pleural effusion.  He was found to have increased oxygen  requirement currently needing 5 L from a baseline of 3 L nasal cannula.  Admitted and being managed for COPD exacerbation.  Physical exam GEN alert and oriented chronically ill, comfortable on nasal cannula HEENT supple neck, reactive pupils CVS normal S1, normal S2, regular rate and rhythm Lungs diminished breath sounds over the right hemithorax.  Faint expiratory wheezing heard bilaterally. 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  Status post thoracentesis on 06/21/2024 with fluid studies consistent with an exudate.  Cytology negative for malignancy.  He presented to [facility] on 09/26 with worsening dyspnea and shortness of breath.  Found to have a moderate right pleural effusion.  # Recurrent right pleural effusion (status post thoracentesis on 09/3 with fluid studies consistent with an exudative  effusion.) # Stage IV adenocarcinoma with mixed response on systemic chemotherapy with plans to start radiation therapy on 10/06 # Severe COPD with FEV1 39% of predicted in 2020  Discussed with patient proceeding with thoracentesis versus PleurX placement however we have decided to wait until he gets on radiation therapy and see if this was correct.  However in the meantime we will proceed with diagnostic and therapeutic thoracentesis to help with symptomatology.  Pleural fluid should be sent for cytology, cell count and differential, LDH, total protein, albumin and Gram stain and culture.  Regarding his COPD I recommend starting him on Brovana  twice daily while inpatient as well as Pulmicort  twice daily.  Pulmonary will follow.  I spent 80 minutes caring for this patient today, including preparing to see the patient, obtaining a medical history , reviewing a separately obtained history, performing a medically appropriate examination and/or evaluation, counseling and educating the patient/family/caregiver, ordering medications, tests, or procedures, documenting clinical information in the electronic health record, and independently interpreting results (not separately reported/billed) and communicating results to the patient/family/caregiver  Darrin Barn, MD [facility] 07/14/2024 5:02 PM

## 2024-07-14 NOTE — Plan of Care (Signed)
  Problem: Education: Goal: Knowledge of disease or condition will improve Outcome: Progressing Goal: Knowledge of the prescribed therapeutic regimen will improve Outcome: Progressing   Problem: Activity: Goal: Ability to tolerate increased activity will improve Outcome: Progressing Goal: Will verbalize the importance of balancing activity with adequate rest periods Outcome: Progressing   Problem: Respiratory: Goal: Ability to maintain a clear airway will improve Outcome: Progressing Goal: Levels of oxygenation will improve Outcome: Progressing Goal: Ability to maintain adequate ventilation will improve Outcome: Progressing   

## 2024-07-15 DIAGNOSIS — J9601 Acute respiratory failure with hypoxia: Secondary | ICD-10-CM | POA: Diagnosis not present

## 2024-07-15 LAB — CBC
HCT: 33.7 % — ABNORMAL LOW (ref 39.0–52.0)
Hemoglobin: 10.2 g/dL — ABNORMAL LOW (ref 13.0–17.0)
MCH: 30.4 pg (ref 26.0–34.0)
MCHC: 30.3 g/dL (ref 30.0–36.0)
MCV: 100.6 fL — ABNORMAL HIGH (ref 80.0–100.0)
Platelets: 240 K/uL (ref 150–400)
RBC: 3.35 MIL/uL — ABNORMAL LOW (ref 4.22–5.81)
RDW: 17.8 % — ABNORMAL HIGH (ref 11.5–15.5)
WBC: 7.3 K/uL (ref 4.0–10.5)
nRBC: 0 % (ref 0.0–0.2)

## 2024-07-15 LAB — BASIC METABOLIC PANEL WITH GFR
Anion gap: 7 (ref 5–15)
BUN: 17 mg/dL (ref 8–23)
CO2: 35 mmol/L — ABNORMAL HIGH (ref 22–32)
Calcium: 8.7 mg/dL — ABNORMAL LOW (ref 8.9–10.3)
Chloride: 99 mmol/L (ref 98–111)
Creatinine, Ser: 0.6 mg/dL — ABNORMAL LOW (ref 0.61–1.24)
GFR, Estimated: >60 mL/min (ref >60)
Glucose, Bld: 96 mg/dL (ref 70–99)
Potassium: 4 mmol/L (ref 3.5–5.1)
Sodium: 141 mmol/L (ref 135–145)

## 2024-07-15 MED ORDER — ASPIRIN 81 MG PO TBEC
81.0000 mg | DELAYED_RELEASE_TABLET | Freq: Every day | ORAL | Status: DC
  Administered 2024-07-16 – 2024-07-18 (×3): 81 mg via ORAL
  Filled 2024-07-15 (×3): qty 1

## 2024-07-15 MED ORDER — PREDNISONE 20 MG PO TABS
30.0000 mg | ORAL_TABLET | Freq: Every day | ORAL | Status: DC
  Administered 2024-07-16: 30 mg via ORAL
  Filled 2024-07-15: qty 1
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
## 2024-07-15 NOTE — TOC Progression Note (Signed)
 Transition of Care [facility]) - Progression Note    Patient Details  Name: Austin Patterson MRN: [id number] Date of Birth: 1956/07/28  Transition of Care [facility]) CM/SW Contact  [place] L Suede [place], [place] Phone Number: 07/15/2024, 1:05 PM  Clinical Narrative:        CSW spoke with patient regarding discharge planning. Patient advised that he is not married; he is twice divorced. He reported living alone. Patient expressed some concerns regarding going to SNF. He had some questions and CSW responded the best way possible.   Patient advised that he agrees that he is not at baseline and SNF is the best possible solution. He stated that he would like bed searches in [facility]. He stated that he has no preference.   FL2 completed. Bed search will be initiated.                  Expected Discharge Plan and Services                                               Social Drivers of Health (SDOH) Interventions SDOH Screenings   Food Insecurity: No Food Insecurity (07/13/2024)  Housing: Low Risk  (07/13/2024)  Transportation Needs: No Transportation Needs (07/13/2024)  Utilities: Not At Risk (07/13/2024)  Depression (PHQ2-9): Low Risk  (07/10/2024)  Social Connections: Socially Isolated (07/13/2024)  Tobacco Use: Medium Risk (07/13/2024)    Readmission Risk Interventions    06/14/2023   11:19 AM  Readmission Risk Prevention Plan  Transportation Screening Complete  Medication Review (RN Care Manager) Complete  PCP or Specialist appointment within 3-5 days of discharge Complete  SW Recovery Care/Counseling Consult Complete  Palliative Care Screening Not Applicable  Skilled Nursing Facility Not Applicable

## 2024-07-15 NOTE — Plan of Care (Signed)
  Problem: Clinical Measurements: Goal: Ability to maintain clinical measurements within normal limits will improve Outcome: Progressing   Problem: Coping: Goal: Level of anxiety will decrease Outcome: Progressing   Problem: Elimination: Goal: Will not experience complications related to bowel motility Outcome: Progressing   Problem: Activity: Goal: Risk for activity intolerance will decrease Outcome: Progressing   Problem: Safety: Goal: Ability to remain free from injury will improve Outcome: Progressing   Problem: Pain Managment: Goal: General experience of comfort will improve and/or be controlled Outcome: Progressing

## 2024-07-15 NOTE — NC FL2 (Signed)
 [place]  MEDICAID FL2 LEVEL OF CARE FORM     IDENTIFICATION  Patient Name: Austin Patterson Birthdate: 13-Nov-1955 Sex: male Admission Date (Current Location): 07/13/2024  [place] and [place] Number:  Chiropodist and Address:  [facility], [address], [place], [place] [address]      Provider Number: [id number]  Attending Physician Name and Address:  Kandis Devaughn Sayres, MD  Relative Name and Phone Number:  Calica III,Aniket e [facility])  [phone]    Current Level of Care: Hospital Recommended Level of Care: Skilled Nursing Facility Prior Approval Number:    Date Approved/Denied: 07/15/24 PASRR Number: [id number]  Discharge Plan: SNF    Current Diagnoses: Patient Active Problem List   Diagnosis Date Noted   Acute hypoxemic respiratory failure (HCC) 07/13/2024   Pleural effusion 06/20/2024   Thrombocytopenia 03/23/2024   Anemia due to antineoplastic chemotherapy 01/27/2024   Encounter for antineoplastic chemotherapy 01/06/2024   Hemoptysis 12/29/2023   Hypocalcemia 09/06/2023   Overweight (BMI 25.0-29.9) 08/08/2023   SIRS (systemic inflammatory response syndrome) (HCC) 08/06/2023   Cancer related pain 07/05/2023   Paroxysmal atrial fibrillation (HCC) 06/16/2023   Sepsis (HCC) 06/12/2023   Macrocytosis 06/08/2023   Stage IV adenocarcinoma of lung (HCC) 05/18/2023   Swelling of right foot 05/11/2023   Heterozygous alpha 1-antitrypsin deficiency (HCC) 04/27/2023   Cough 04/03/2023   Alcohol use 04/03/2023   Constipation 12/10/2022   COPD exacerbation (HCC) 02/23/2022   GERD without esophagitis 02/23/2022   Alpha-1-antitrypsin deficiency carrier 01/10/2021   History of nonmelanoma skin cancer 12/19/2020   Asthma 03/12/2020   Chronic respiratory failure with hypoxia (HCC) 03/12/2020   Hyperlipidemia    Asthma-COPD overlap syndrome (HCC)    Bleeding nose    Alcohol abuse    RUQ abdominal pain    Anaphylaxis 11/01/2019    Hypotension 11/01/2019   CAD (coronary artery disease) 11/01/2019   H/O heart artery stent 11/01/2019   Acute on chronic respiratory failure with hypoxia (HCC)    RUQ pain    Essential hypertension    Pulmonary fibrosis (HCC) 08/30/2019   Hypomagnesemia 03/31/2017   Hiatal hernia    Reflux esophagitis    Hematemesis without nausea    Hyponatremia 09/21/2016   Multifocal pneumonia 12/09/2015    Orientation RESPIRATION BLADDER Height & Weight     Self, Time, Situation   (Diminished) Continent Weight: 188 lb (85.3 kg) Height:  5' 7 (170.2 cm)  BEHAVIORAL SYMPTOMS/MOOD NEUROLOGICAL BOWEL NUTRITION STATUS      Continent Diet (Diet regular Room service appropriate? Yes; Fluid consistency: Thin: General starting at 09/25 1819)  AMBULATORY STATUS COMMUNICATION OF NEEDS Skin   Limited Assist Verbally Normal (Dryness)                       Personal Care Assistance Level of Assistance  Bathing, Feeding, Dressing Bathing Assistance: Limited assistance Feeding assistance: Limited assistance Dressing Assistance: Limited assistance     Functional Limitations Info  Sight, Hearing, Speech Sight Info: Impaired (Wears eyeglasses)        SPECIAL CARE FACTORS FREQUENCY  PT (By licensed PT), OT (By licensed OT)     PT Frequency: 2x OT Frequency: 2x            Contractures Contractures Info: Not present    Additional Factors Info  Code Status, Allergies Code Status Info: DNR-Limited Allergies Info: Tizanidine ; Amoxicillin            Current Medications (07/15/2024):  This  is the current hospital active medication list Current Facility-Administered Medications  Medication Dose Route Frequency Provider Last Rate Last Admin   acetaminophen  (TYLENOL ) tablet 650 mg  650 mg Oral Q6H PRN Lenon Marien CROME, MD   650 mg at 07/14/24 1643   Or   acetaminophen  (TYLENOL ) suppository 650 mg  650 mg Rectal Q6H PRN Anderson, Chelsey L, MD       alum & mag hydroxide-simeth  (MAALOX/MYLANTA) 200-200-20 MG/5ML suspension 30 mL  30 mL Oral Q4H PRN Duncan, Hazel V, MD   30 mL at 07/13/24 2340   arformoterol  (BROVANA ) nebulizer solution 15 mcg  15 mcg Nebulization BID Malka Domino, MD   15 mcg at 07/15/24 0737   atorvastatin  (LIPITOR) tablet 40 mg  40 mg Oral QHS Lenon Marien CROME, MD   40 mg at 07/14/24 2138   bisacodyl  (DULCOLAX) EC tablet 5 mg  5 mg Oral Daily PRN Lenon Marien CROME, MD       enoxaparin  (LOVENOX ) injection 40 mg  40 mg Subcutaneous Q24H Lenon Marien CROME, MD   40 mg at 07/14/24 2138   gabapentin  (NEURONTIN ) capsule 300 mg  300 mg Oral TID Kandis Devaughn Sayres, MD   300 mg at 07/15/24 0845   ipratropium-albuterol  (DUONEB) 0.5-2.5 (3) MG/3ML nebulizer solution 3 mL  3 mL Nebulization Q4H PRN Lenon Marien CROME, MD   3 mL at 07/14/24 1434   melatonin tablet 5 mg  5 mg Oral QHS PRN Duncan, Hazel V, MD   5 mg at 07/14/24 0220   metoprolol  succinate (TOPROL -XL) 24 hr tablet 50 mg  50 mg Oral Daily Lenon Marien CROME, MD   50 mg at 07/15/24 0914   montelukast  (SINGULAIR ) tablet 10 mg  10 mg Oral Daily Lenon Marien CROME, MD   10 mg at 07/14/24 2138   nicotine  (NICODERM CQ  - dosed in mg/24 hr) patch 7 mg  7 mg Transdermal Daily PRN Lenon Marien CROME, MD       ondansetron  (ZOFRAN ) tablet 4 mg  4 mg Oral Q6H PRN Lenon Marien CROME, MD       Or   ondansetron  (ZOFRAN ) injection 4 mg  4 mg Intravenous Q6H PRN Lenon Marien CROME, MD       oxyCODONE  (Oxy IR/ROXICODONE ) immediate release tablet 5 mg  5 mg Oral Q8H PRN Anderson, Chelsey L, MD   5 mg at 07/15/24 0024   pantoprazole  (PROTONIX ) EC tablet 40 mg  40 mg Oral BID Kandis Devaughn Sayres, MD   40 mg at 07/15/24 0845   polyethylene glycol (MIRALAX  / GLYCOLAX ) packet 17 g  17 g Oral Daily Lenon Marien CROME, MD       predniSONE  (DELTASONE ) tablet 40 mg  40 mg Oral Q breakfast Lenon Marien CROME, MD   40 mg at 07/15/24 9155   Facility-Administered Medications Ordered in Other Encounters   Medication Dose Route Frequency Provider Last Rate Last Admin   heparin  lock flush 100 unit/mL  500 Units Intravenous Once Yu, Zhou, MD         Discharge Medications: Please see discharge summary for a list of discharge medications.  Relevant Imaging Results:  Relevant Lab Results:   Additional Information [id number]  [place] L Bentli Llorente, LCSW

## 2024-07-15 NOTE — Plan of Care (Signed)

## 2024-07-15 NOTE — Evaluation (Signed)
 Occupational Therapy Evaluation Patient Details Name: Austin Patterson MRN: [id number] DOB: Sep 09, 1956 Today's Date: 07/15/2024   History of Present Illness   Pt is a 68 y/o M presenting to ED with c/o SOB with activity, desatting to 88% on home O2. CT negative for PE. PMH significant for COPD on 3L at baseline, pulmonary fibrosis, stage IV adenocarcinoma, pleural effusion, HTN, CAD, HLD, alcohol dependence, afib.     Clinical Impressions Pt was seen for OT evaluation this date. Prior to hospital admission, pt was indep/MODI ambulating with a rollator with in the home, limited community mobility. Pt lives alone in a one level home with a ramped entrance. Family assist pt with IADLs such as housework and cleaning. Pt presents with deficits in decreased indep in self care, balance, functional mobility/transfers and activity tolerance affecting safe and optimal ADL completion. Pt completed bed mobility with no physical assistance, extra time to recover in sitting, as pt feels out of breathe with mobility. Pt donned bilateral socks in sitting with setupA. On Initial STS CGA for safety, progressed to supervision with good safety awareness throughout amb within room with use of RW. Oxygen  level monitored throughout session; pt on 2.5L on arrival to room sp02 level 79%. Bumped to 3L then 4L when pt was taking ~10 mins to recover to 90%. HR reached 125bpm during ambulation. Pt returned to bed with all needs in reach. Pt would benefit from skilled OT services to address noted impairments and functional limitations (see below for any additional details) in order to maximize safety and independence while minimizing future risk of falls, injury, and readmission. OT will follow acutely.      If plan is discharge home, recommend the following:   A little help with walking and/or transfers;A little help with bathing/dressing/bathroom;Assistance with cooking/housework;Assist for transportation;Help with stairs or  ramp for entrance     Functional Status Assessment   Patient has had a recent decline in their functional status and demonstrates the ability to make significant improvements in function in a reasonable and predictable amount of time.     Equipment Recommendations   None recommended by OT     Recommendations for Other Services         Precautions/Restrictions   Precautions Precautions: Fall Recall of Precautions/Restrictions: Intact Restrictions Weight Bearing Restrictions Per Provider Order: No     Mobility Bed Mobility Overal bed mobility: Modified Independent Bed Mobility: Supine to Sit, Sit to Supine           General bed mobility comments: No physical assistance for bed mobility from supine position, required extra time to recover in sitting    Transfers Overall transfer level: Needs assistance Equipment used: Rolling walker (2 wheels) Transfers: Sit to/from Stand Sit to Stand: Contact guard assist           General transfer comment: Initial STS CGA for safety, progressed to supervision with good safety awareness throughout amb within room with use of RW.      Balance Overall balance assessment: Needs assistance Sitting-balance support: Feet supported Sitting balance-Leahy Scale: Good Sitting balance - Comments: Steady reaching within BOS   Standing balance support: Bilateral upper extremity supported, Reliant on assistive device for balance Standing balance-Leahy Scale: Fair Standing balance comment: No noted LOB in standing                           ADL either performed or assessed with clinical judgement   ADL Overall  ADL's : Needs assistance/impaired Eating/Feeding: Set up;Sitting                   Lower Body Dressing: Supervision/safety;Sitting/lateral leans   Toilet Transfer: Ambulation;Rolling walker (2 wheels);Supervision/safety   Toileting- Architect and Hygiene: Set up;Sitting/lateral  lean Toileting - Clothing Manipulation Details (indicate cue type and reason): used urinal while seated on the EOB     Functional mobility during ADLs: Supervision/safety;Rolling walker (2 wheels) General ADL Comments: Donned socks on BLE with setupA     Vision Baseline Vision/History: 1 Wears glasses       Perception         Praxis         Pertinent Vitals/Pain Pain Assessment Pain Assessment: 0-10 Pain Score: 6  Pain Location: lung and R side flank from procdure Pain Descriptors / Indicators: Sore Pain Intervention(s): Limited activity within patient's tolerance, Monitored during session     Extremity/Trunk Assessment Upper Extremity Assessment Upper Extremity Assessment: Generalized weakness   Lower Extremity Assessment Lower Extremity Assessment: Defer to PT evaluation   Cervical / Trunk Assessment Cervical / Trunk Assessment: Normal   Communication Communication Communication: No apparent difficulties   Cognition Arousal: Alert Behavior During Therapy: WFL for tasks assessed/performed Cognition: No apparent impairments             OT - Cognition Comments: A/Ox4                 Following commands: Intact       Cueing  General Comments   Cueing Techniques: Verbal cues  Pt on 2.5L on arrival to room sp02 level 79%. Bumped to 3L then 4L when pt was taking ~10 mins to recover to 90%. HR reached 125bpm during ambulation.   Exercises Exercises: Other exercises Other Exercises Other Exercises: Edu: Role of OT eval, safe ADL completion, benefits of sitting in the chair for lung explansion   Shoulder Instructions      Home Living Family/patient expects to be discharged to:: Private residence Living Arrangements: Alone Available Help at Discharge: Family;Available PRN/intermittently Type of Home: House Home Access: Ramped entrance     Home Layout: One level     Bathroom Shower/Tub: Chief Strategy Officer: Standard Bathroom  Accessibility: No   Home Equipment: Cane - single point;Rollator (4 wheels);Rolling Walker (2 wheels);Shower seat          Prior Functioning/Environment Prior Level of Function : Independent/Modified Independent             Mobility Comments: pt cited using rollator for mobility within home, requires 3L O2 at baseline. States he mainly walks household distances ADLs Comments: IND with basic ADLs, has some help for housework and cleaning when family stops by    OT Problem List: Decreased strength;Impaired balance (sitting and/or standing);Decreased activity tolerance;Decreased knowledge of use of DME or AE;Decreased safety awareness   OT Treatment/Interventions: Self-care/ADL training;Therapeutic exercise;DME and/or AE instruction;Energy conservation;Balance training;Patient/family education      OT Goals(Current goals can be found in the care plan section)   Acute Rehab OT Goals Patient Stated Goal: Return home soon OT Goal Formulation: With patient Time For Goal Achievement: 07/29/24 Potential to Achieve Goals: Good ADL Goals Pt Will Perform Grooming: with modified independence;standing Pt Will Perform Lower Body Dressing: with modified independence;sit to/from stand Pt Will Transfer to Toilet: with modified independence;ambulating Pt Will Perform Toileting - Clothing Manipulation and hygiene: with modified independence;sitting/lateral leans   OT Frequency:  Min 2X/week  Co-evaluation              AM-PAC OT 6 Clicks Daily Activity     Outcome Measure Help from another person eating meals?: None Help from another person taking care of personal grooming?: None Help from another person toileting, which includes using toliet, bedpan, or urinal?: A Little Help from another person bathing (including washing, rinsing, drying)?: A Little Help from another person to put on and taking off regular upper body clothing?: None Help from another person to put on and  taking off regular lower body clothing?: None 6 Click Score: 22   End of Session Equipment Utilized During Treatment: Gait belt;Rolling walker (2 wheels);Oxygen  Nurse Communication: Mobility status  Activity Tolerance: Patient tolerated treatment well Patient left: in bed;with call bell/phone within reach;with bed alarm set;with SCD's reapplied  OT Visit Diagnosis: Unsteadiness on feet (R26.81);Other abnormalities of gait and mobility (R26.89);Repeated falls (R29.6)                Time: 8877-8844 OT Time Calculation (min): 33 min Charges:  OT General Charges $OT Visit: 1 Visit OT Evaluation $OT Eval Moderate Complexity: 1 Mod OT Treatments $Therapeutic Activity: 8-22 mins  Larraine Colas M.S. OTR/L  07/15/24, 1:35 PM

## 2024-07-16 DIAGNOSIS — J9601 Acute respiratory failure with hypoxia: Secondary | ICD-10-CM | POA: Diagnosis not present

## 2024-07-16 DIAGNOSIS — J90 Pleural effusion, not elsewhere classified: Secondary | ICD-10-CM | POA: Diagnosis not present

## 2024-07-16 LAB — BASIC METABOLIC PANEL WITH GFR
Anion gap: 8 (ref 5–15)
BUN: 22 mg/dL (ref 8–23)
CO2: 33 mmol/L — ABNORMAL HIGH (ref 22–32)
Calcium: 8.7 mg/dL — ABNORMAL LOW (ref 8.9–10.3)
Chloride: 95 mmol/L — ABNORMAL LOW (ref 98–111)
Creatinine, Ser: 0.6 mg/dL — ABNORMAL LOW (ref 0.61–1.24)
GFR, Estimated: >60 mL/min (ref >60)
Glucose, Bld: 102 mg/dL — ABNORMAL HIGH (ref 70–99)
Potassium: 4.5 mmol/L (ref 3.5–5.1)
Sodium: 136 mmol/L (ref 135–145)

## 2024-07-16 LAB — CBC
HCT: 33.5 % — ABNORMAL LOW (ref 39.0–52.0)
Hemoglobin: 10.5 g/dL — ABNORMAL LOW (ref 13.0–17.0)
MCH: 31.4 pg (ref 26.0–34.0)
MCHC: 31.3 g/dL (ref 30.0–36.0)
MCV: 100.3 fL — ABNORMAL HIGH (ref 80.0–100.0)
Platelets: 292 K/uL (ref 150–400)
RBC: 3.34 MIL/uL — ABNORMAL LOW (ref 4.22–5.81)
RDW: 17.9 % — ABNORMAL HIGH (ref 11.5–15.5)
WBC: 7.4 K/uL (ref 4.0–10.5)
nRBC: 0 % (ref 0.0–0.2)

## 2024-07-16 MED ORDER — BUDESONIDE 0.5 MG/2ML IN SUSP
0.5000 mg | Freq: Two times a day (BID) | RESPIRATORY_TRACT | Status: DC
  Administered 2024-07-16 – 2024-07-18 (×5): 0.5 mg via RESPIRATORY_TRACT
  Filled 2024-07-16 (×5): qty 2

## 2024-07-16 MED ORDER — PREDNISONE 20 MG PO TABS: 20.0000 mg | ORAL_TABLET | Freq: Every day | ORAL | Status: DC

## 2024-07-16 MED ORDER — CLOPIDOGREL BISULFATE 75 MG PO TABS
75.0000 mg | ORAL_TABLET | Freq: Every day | ORAL | Status: DC
  Administered 2024-07-17 – 2024-07-18 (×2): 75 mg via ORAL
  Filled 2024-07-16 (×2): qty 1

## 2024-07-16 NOTE — Progress Notes (Signed)
 Mobility Specialist - Progress Note   Pre-mobility: HR, BP, SpO2(90) During mobility: HR, BP, SpO2(88) on 3L pushed to 4 to stay above 88. (90) Post-mobility: HR, BP, SPO2(94)     07/16/24 1000  Mobility  Activity Ambulated with assistance;Stood at bedside  Level of Assistance Standby assist, set-up cues, supervision of patient - no hands on  Assistive Device Front wheel walker  Distance Ambulated (ft) 160 ft  Range of Motion/Exercises Active  Activity Response Tolerated well  Mobility Referral Yes  Mobility visit 1 Mobility  Mobility Specialist Start Time (ACUTE ONLY) 1012  Mobility Specialist Stop Time (ACUTE ONLY) 1026  Mobility Specialist Time Calculation (min) (ACUTE ONLY) 14 min   Pt resting in recliner on RA upon entry. Pt STS and ambulates to hallway around NS SBA on 3L. Pt returned to recliner and left with needs in reach.   Guido Rumble Mobility Specialist 07/16/24, 10:40 AM

## 2024-07-16 NOTE — Progress Notes (Signed)
 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  Status post thoracentesis on 06/21/2024 with fluid studies consistent with an exudate.  Cytology negative for malignancy.  He presented to [facility] on 09/26 with worsening dyspnea and shortness of breath.   CTA of the chest ruled out PE.  It showed moderate right pleural effusion and small left pleural effusion.   He was found to have increased oxygen  requirement currently needing 5 L from a baseline of 3 L nasal cannula.  Admitted and being managed for COPD exacerbation.   Subjective Patient feels at baseline today. He reports feeling some relief after his thoracentesis. He was able to walk around the unit with  compeletly losing his breath.   We rediscussed that if this is recurrent than an IPC my be helpful. I told him that he will need to discuss that further with his outpatient pulmonologist.   Physical exam GEN alert and oriented chronically ill, comfortable on nasal cannula HEENT supple neck, reactive pupils CVS normal S1, normal S2, regular rate and rhythm Lungs improved breath sounds over the right hemithorax.  Faint expiratory wheezing heard bilaterally. 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  Status post thoracentesis on  06/21/2024 with fluid studies consistent with an exudate.  Cytology negative for malignancy.  He presented to [facility] on 09/26 with worsening dyspnea and shortness of breath.  Found to have a moderate right pleural effusion.   # Recurrent right pleural effusion (status post thoracentesis on 09/3 with fluid studies consistent with an exudative effusion.) s/p thoracentesis 09/26 with 1.2L drained # Stage IV adenocarcinoma with mixed response on systemic chemotherapy with plans to start radiation therapy on 10/06 # Severe COPD with FEV1 39% of predicted in 2020   Discussed with patient proceeding with thoracentesis versus PleurX placement however we have decided to wait until he gets on radiation therapy and see if this was correct.  However in the meantime we will proceed with diagnostic and therapeutic thoracentesis to help with symptomatology.   S/p thoracentesis on 09/26 with 1.2L drained, improved symptomatically post thora. To consuder IPC placement but will await radiation and see if it will help with that. Fluid consistent with an exudate likely malignant effusion awaiting Cytology report   Regarding his COPD I recommend starting him on Brovana  twice daily while inpatient as well as Pulmicort  twice daily (Can be discharged on that and continue with that as outpatient as he already is established with a nebulizer at home).   I will arrange an outpatient follow up with is primary pulmonologist in the clinic.   Pulmonary team will sign off.   I spent 30 minutes caring for this patient today, including preparing to see the patient, obtaining a medical history , reviewing a separately obtained history, performing a medically appropriate examination and/or evaluation, counseling and educating  the patient/family/caregiver, documenting clinical information in the electronic health record, and independently interpreting results (not separately reported/billed) and communicating results to the  patient/family/caregiver  Austin Barn, MD [place] [facility] 07/16/2024 12:39 PM

## 2024-07-16 NOTE — Progress Notes (Signed)
 PROGRESS NOTE    Austin Patterson  FMW:[id number] DOB: 12-03-1955 DOA: 07/13/2024 PCP: Autry Grayce LABOR, PA  Outpatient Specialists: oncology, pulmonology    Brief Narrative:   From admission h and p  Austin Patterson is a 68 y.o. male with a PMH significant for COPD, stage IV adenocarcinoma of lung, pleural effusion, pulmonary fibrosis, HTN, CAD, HLD, alcohol dependence, GERD, alpha 1 antitrypsin deficiency, PAF. At baseline, they live alone and are independent with ADLs.  They use a walker to ambulate in home and wheelchair for further distances due to SOB.    They presented from home to the ED on 07/13/2024 with SOB worsening x 2 days.  At baseline, they use 3 L nasal cannula of oxygen  continuously. He noticed that he was having increased dyspnea walking his usual distances to the bathroom and around the house. He says that his breathing treatments are the only thing keeping him going at this time and the results only last a short time. Denies CP, leg swelling, diaphoresis, fever. He admits to being non-adherent with his oxygen  at times when ambulating in his home due to the cumbersome nature of hauling an oxygen  tank with him.  He had a fitting today for radiation and is scheduled to start 5 days of palliative radiation on Oct 6.    In the ED, it was found that they had new O2 requirement increased to 5 L to maintain oxygen  saturations greater than 88% and otherwise stable vital signs. Significant findings included: Na+ 137, K+ 4, Cl- 95, creatinine 0.86.  WBC 5.2, hemoglobin 10.5, platelets 220.  BNP 70.6.  Troponin 7.  LA 1.0> 1.4.  Blood cultures collected.  VBG: pH 7.31, pCO2 70, bicarb 35.2.  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  Stable right lung mass middle lobe with surrounding consolidation.  Moderate right and small left pleural effusions. He denies tobacco  use and states that he is not a regular EtOH consumer. Last drink was about 2 weeks ago.    They were initially treated with DuoNebs, Solu-Medrol .    Patient was admitted to medicine service for further workup and management of acute on chronic hypercarbic respiratory failure as outlined in detail below.   Assessment & Plan:   Principal Problem:   Acute hypoxemic respiratory failure (HCC) Active Problems:   Acute on chronic respiratory failure with hypoxia (HCC)   CAD (coronary artery disease)   Pulmonary fibrosis (HCC)   Essential hypertension   Asthma-COPD overlap syndrome (HCC)   Alcohol abuse   Stage IV adenocarcinoma of lung (HCC)   Paroxysmal atrial fibrillation (HCC)  # Acute on chronic hypoxic respiratory failure Baseline is 2 liters, here requiring 5 initially, 4 today. Multifactorial as below. - [place]rtle O2, wean as able  # Pleural effusion 2/2 malignancy. Contributing to above subacute respiratory failure. 1200 ml therapeutic thoracentesis performed on 9/28 - monitor for reaccumulation, hopeful this will be slow and that radiation next month will help prevent recurrence. Otherwise may need pleurx catheter - follow pleural fluid culture, ngtd  # COPD End-stage per last pulm note. Doesn't describe acute worsening such as wheeze, cough, etc. Nothing acute on CTA. - d/c steroids - continue brovana  and pulmicort  per pulm  # Pericardial effusion? On CT. Bnp low. Does have hx cad. TTE unremarkable however  # Stage 4 lung cancer On maintenance gemcitabine  currently, with plans to start  radiation therapy next month. Next dose of chemo scheduled for 9/29. Oncology is aware patient is here.  # Debility Lives alone, endorses worsening weakness, ambulates with a walker at baseline. PT advising SNF, patient consents for this, though may be difficult to find a SNF that will accept patient given active chemotherapy. - TOC bed search underway  # Alcohol abuse No recent alcohol use,  no s/s withdrawal  # CAD S/p stent in 2009. No chest pain. TTE unremarkable - resume plavix  - resume asa  # HTN Borderline hypotensive - hold home amlodipine , lasix   # Chronic pain - home gabapentin , oxycodone   # A-fib Rate controlled, not anticoagulated - home metop   DVT prophylaxis: lovenox  Code Status: dnr/dni Family Communication: son brian updated telephonically 9/27  Level of care: Med-Surg Status is: inpt    Consultants:  pulm  Procedures: Thoracentesis 9/26  Antimicrobials:  S/p azithromycin     Subjective: Reports breathing stable from yesterday, definitely improved from admission  Objective: Vitals:   07/15/24 1941 07/16/24 0520 07/16/24 0725 07/16/24 0756  BP: 127/71 131/74  124/66  Pulse: 87 80  80  Resp:    16  Temp: (!) 97.4 F (36.3 C) 98.2 F (36.8 C)  98.3 F (36.8 C)  TempSrc:      SpO2: 93% 100% 97% 99%  Weight:      Height:        Intake/Output Summary (Last 24 hours) at 07/16/2024 1301 Last data filed at 07/16/2024 0900 Gross per 24 hour  Intake 960 ml  Output 1915 ml  Net -955 ml   Filed Weights   07/13/24 1329  Weight: 85.3 kg    Examination:  General exam: Appears calm and comfortable  Respiratory system: rales at bases, decreased right mid and base Cardiovascular system: S1 & S2 heard, RR Gastrointestinal system: Abdomen is nondistended, soft and nontender.  Central nervous system: Alert and oriented. No focal neurological deficits. Extremities: Symmetric 5 x 5 power. Skin: No rashes, lesions or ulcers Psychiatry: Judgement and insight appear normal. Mood & affect appropriate.     Data Reviewed: I have personally reviewed following labs and imaging studies  CBC: Recent Labs  Lab 07/13/24 1335 07/14/24 0430 07/15/24 0536 07/16/24 0322  WBC 5.2 4.3 7.3 7.4  NEUTROABS 3.9  --   --   --   HGB 10.5* 9.8* 10.2* 10.5*  HCT 34.2* 31.7* 33.7* 33.5*  MCV 101.8* 99.1 100.6* 100.3*  PLT 220 214 240 292   Basic  Metabolic Panel: Recent Labs  Lab 07/13/24 1335 07/14/24 0430 07/15/24 0536 07/16/24 0322  NA 137 138 141 136  K 4.0 4.2 4.0 4.5  CL 95* 94* 99 95*  CO2 31 32 35* 33*  GLUCOSE 121* 143* 96 102*  BUN 13 13 17 22   CREATININE 0.86 0.89 0.60* 0.60*  CALCIUM  8.8* 8.5* 8.7* 8.7*   GFR: Estimated Creatinine Clearance: 92.3 mL/min (A) (by C-G formula based on SCr of 0.6 mg/dL (L)). Liver Function Tests: Recent Labs  Lab 07/13/24 1335  AST 23  ALT 18  ALKPHOS 115  BILITOT 0.5  PROT 6.9  ALBUMIN 3.8   No results for input(s): LIPASE, AMYLASE in the last 168 hours. No results for input(s): AMMONIA in the last 168 hours. Coagulation Profile: Recent Labs  Lab 07/13/24 1335  INR 1.0   Cardiac Enzymes: No results for input(s): CKTOTAL, CKMB, CKMBINDEX, TROPONINI in the last 168 hours. BNP (last 3 results) No results for input(s): PROBNP in the last 8760  hours. HbA1C: No results for input(s): HGBA1C in the last 72 hours. CBG: No results for input(s): GLUCAP in the last 168 hours. Lipid Profile: No results for input(s): CHOL, HDL, LDLCALC, TRIG, CHOLHDL, LDLDIRECT in the last 72 hours. Thyroid  Function Tests: No results for input(s): TSH, T4TOTAL, FREET4, T3FREE, THYROIDAB in the last 72 hours. Anemia Panel: No results for input(s): VITAMINB12, FOLATE, FERRITIN, TIBC, IRON , RETICCTPCT in the last 72 hours. Urine analysis:    Component Value Date/Time   COLORURINE YELLOW (A) 08/06/2023 1145   APPEARANCEUR CLEAR (A) 08/06/2023 1145   LABSPEC 1.019 08/06/2023 1145   PHURINE 6.0 08/06/2023 1145   GLUCOSEU NEGATIVE 08/06/2023 1145   HGBUR NEGATIVE 08/06/2023 1145   BILIRUBINUR NEGATIVE 08/06/2023 1145   KETONESUR NEGATIVE 08/06/2023 1145   PROTEINUR NEGATIVE 08/06/2023 1145   NITRITE NEGATIVE 08/06/2023 1145   LEUKOCYTESUR NEGATIVE 08/06/2023 1145   Sepsis Labs: @LABRCNTIP (procalcitonin:4,lacticidven:4)  ) Recent  Results (from the past 240 hours)  Blood Culture (routine x 2)     Status: None (Preliminary result)   Collection Time: 07/13/24  2:26 PM   Specimen: BLOOD  Result Value Ref Range Status   Specimen Description BLOOD BLOOD RIGHT ARM  Final   Special Requests   Final    BOTTLES DRAWN AEROBIC AND ANAEROBIC Blood Culture adequate volume   Culture   Final    NO GROWTH 3 DAYS Performed at [facility], [address] [place], [place] [address]    Report Status PENDING  Incomplete  Blood Culture (routine x 2)     Status: None (Preliminary result)   Collection Time: 07/13/24  2:26 PM   Specimen: BLOOD  Result Value Ref Range Status   Specimen Description BLOOD BLOOD LEFT ARM  Final   Special Requests   Final    BOTTLES DRAWN AEROBIC AND ANAEROBIC Blood Culture adequate volume   Culture   Final    NO GROWTH 3 DAYS Performed at [facility], [address], [place], [place] [address]    Report Status PENDING  Incomplete  Resp panel by RT-PCR (RSV, Flu A&B, Covid) Anterior Nasal Swab     Status: None   Collection Time: 07/13/24  2:26 PM   Specimen: Anterior Nasal Swab  Result Value Ref Range Status   SARS Coronavirus 2 by RT PCR NEGATIVE NEGATIVE Final    Comment: (NOTE) SARS-CoV-2 target nucleic acids are NOT DETECTED.  The SARS-CoV-2 RNA is generally detectable in upper respiratory specimens during the acute phase of infection. The lowest concentration of SARS-CoV-2 viral copies this assay can detect is 138 copies/mL. A negative result does not preclude SARS-Cov-2 infection and should not be used as the sole basis for treatment or other patient management decisions. A negative result may occur with  improper specimen collection/handling, submission of specimen other than nasopharyngeal swab, presence of viral mutation(s) within the areas targeted by this assay, and inadequate number of viral copies(<138 copies/mL). A negative result must be combined  with clinical observations, patient history, and epidemiological information. The expected result is Negative.  Fact Sheet for Patients:  BloggerCourse.com  Fact Sheet for Healthcare Providers:  SeriousBroker.it  This test is no t yet approved or cleared by the [place]  FDA and  has been authorized for detection and/or diagnosis of SARS-CoV-2 by FDA under an Emergency Use Authorization (EUA). This EUA will remain  in effect (meaning this test can be used) for the duration of the COVID-19 declaration under Section 564(b)(1) of the Act, 21  U.S.C.section 360bbb-3(b)(1), unless the authorization is terminated  or revoked sooner.       Influenza A by PCR NEGATIVE NEGATIVE Final   Influenza B by PCR NEGATIVE NEGATIVE Final    Comment: (NOTE) The Xpert Xpress SARS-CoV-2/FLU/RSV plus assay is intended as an aid in the diagnosis of influenza from Nasopharyngeal swab specimens and should not be used as a sole basis for treatment. Nasal washings and aspirates are unacceptable for Xpert Xpress SARS-CoV-2/FLU/RSV testing.  Fact Sheet for Patients: BloggerCourse.com  Fact Sheet for Healthcare Providers: SeriousBroker.it  This test is not yet approved or cleared by the [place]  FDA and has been authorized for detection and/or diagnosis of SARS-CoV-2 by FDA under an Emergency Use Authorization (EUA). This EUA will remain in effect (meaning this test can be used) for the duration of the COVID-19 declaration under Section 564(b)(1) of the Act, 21 U.S.C. section 360bbb-3(b)(1), unless the authorization is terminated or revoked.     Resp Syncytial Virus by PCR NEGATIVE NEGATIVE Final    Comment: (NOTE) Fact Sheet for Patients: BloggerCourse.com  Fact Sheet for Healthcare Providers: SeriousBroker.it  This test is not yet approved  or cleared by the [place]  FDA and has been authorized for detection and/or diagnosis of SARS-CoV-2 by FDA under an Emergency Use Authorization (EUA). This EUA will remain in effect (meaning this test can be used) for the duration of the COVID-19 declaration under Section 564(b)(1) of the Act, 21 U.S.C. section 360bbb-3(b)(1), unless the authorization is terminated or revoked.  Performed at [facility], [address] [place], [place] [address]   Body fluid culture w Gram Stain     Status: None (Preliminary result)   Collection Time: 07/14/24  4:51 PM   Specimen: Pleura; Body Fluid  Result Value Ref Range Status   Specimen Description   Final    PLEURAL Performed at [facility], [address] [place], [place] [address]    Special Requests   Final    NONE Performed at [facility], [address] [place], [place] [address]    Gram Stain   Final    WBC PRESENT, PREDOMINANTLY MONONUCLEAR NO ORGANISMS SEEN CYTOSPIN SMEAR    Culture   Final    NO GROWTH 2 DAYS Performed at [facility], [address] [place], [place] [address]    Report Status PENDING  Incomplete         Radiology Studies: DG Chest Port 1 View Result Date: 07/14/2024 CLINICAL DATA:  Status post thoracentesis, right lung cancer EXAM: PORTABLE CHEST 1 VIEW COMPARISON:  07/13/2024 FINDINGS: Single frontal view of the chest demonstrates stable left chest wall port. The cardiac silhouette is unremarkable. The right middle lobe and left lower lobe masses seen on recent CT are again identified and not appreciably changed. Near complete resolution of the right pleural effusion after interval thoracentesis. Bullous changes are again seen at the right lung base. No pneumothorax. No acute bony abnormality. IMPRESSION: 1. Near complete resolution of the right pleural effusion after interval thoracentesis. No evidence of pneumothorax. 2. Stable emphysematous changes, with large  bullet the right lung base again noted. 3. Stable right middle and left lower lobe masses compatible with history of lung cancer. Electronically Signed   By: Ozell Daring M.D.   On: 07/14/2024 18:42   ECHOCARDIOGRAM COMPLETE Result Date: 07/14/2024    ECHOCARDIOGRAM REPORT   Patient Name:   Austin Patterson Date of Exam: 07/14/2024 Medical Rec #:  [id number]  Height:       67.0 in Accession #:    [id number]         Weight:       188.0 lb Date of Birth:  07-Aug-1956           BSA:          1.970 m Patient Age:    68 years           BP:           137/74 mmHg Patient Gender: M                  HR:           79 bpm. Exam Location:  [facility] Procedure: 2D Echo, Cardiac Doppler and Color Doppler (Both Spectral and Color            Flow Doppler were utilized during procedure). Indications:     Dyspnea R06.00  History:         Patient has prior history of Echocardiogram examinations, most                  recent 04/04/2023. Signs/Symptoms:Chest Pain; Risk                  Factors:Hypertension and Dyslipidemia. Pulmonary fibrosis.  Sonographer:     Christopher Furnace Referring Phys:  [id number] [facility] Thelia Tanksley Diagnosing Phys: Redell Cave MD  Sonographer Comments: Apicals are off axis. IMPRESSIONS  1. Left ventricular ejection fraction, by estimation, is 55 to 60%. The left ventricle has normal function. The left ventricle has no regional wall motion abnormalities. Left ventricular diastolic parameters are consistent with Grade I diastolic dysfunction (impaired relaxation).  2. Right ventricular systolic function is low normal. The right ventricular size is moderately enlarged.  3. The mitral valve is normal in structure. No evidence of mitral valve regurgitation.  4. The aortic valve is calcified. Aortic valve regurgitation is not visualized. Aortic valve sclerosis/calcification is present, without any evidence of aortic stenosis.  5. The inferior vena cava is normal in size with greater than 50% respiratory variability,  suggesting right atrial pressure of 3 mmHg. FINDINGS  Left Ventricle: Left ventricular ejection fraction, by estimation, is 55 to 60%. The left ventricle has normal function. The left ventricle has no regional wall motion abnormalities. The left ventricular internal cavity size was normal in size. There is  no left ventricular hypertrophy. Left ventricular diastolic parameters are consistent with Grade I diastolic dysfunction (impaired relaxation). Right Ventricle: The right ventricular size is moderately enlarged. No increase in right ventricular wall thickness. Right ventricular systolic function is low normal. Left Atrium: Left atrial size was normal in size. Right Atrium: Right atrial size was normal in size. Pericardium: Trivial pericardial effusion is present. Mitral Valve: The mitral valve is normal in structure. No evidence of mitral valve regurgitation. Tricuspid Valve: The tricuspid valve is normal in structure. Tricuspid valve regurgitation is mild. Aortic Valve: The aortic valve is calcified. Aortic valve regurgitation is not visualized. Aortic valve sclerosis/calcification is present, without any evidence of aortic stenosis. Aortic valve mean gradient measures 4.0 mmHg. Aortic valve peak gradient measures 6.9 mmHg. Aortic valve area, by VTI measures 2.63 cm. Pulmonic Valve: The pulmonic valve was not well visualized. Pulmonic valve regurgitation is not visualized. Aorta: The aortic root is normal in size and structure. Venous: The inferior vena cava is normal in size with greater than 50% respiratory variability, suggesting right atrial pressure of 3 mmHg. IAS/Shunts:  No atrial level shunt detected by color flow Doppler.  LEFT VENTRICLE PLAX 2D LVIDd:         5.00 cm   Diastology LVIDs:         3.60 cm   LV e' medial:    7.51 cm/s LV PW:         1.10 cm   LV E/e' medial:  9.3 LV IVS:        1.00 cm   LV e' lateral:   17.50 cm/s LVOT diam:     2.10 cm   LV E/e' lateral: 4.0 LV SV:         58 LV SV  Index:   30 LVOT Area:     3.46 cm  RIGHT VENTRICLE RV Basal diam:  4.90 cm RV Mid diam:    4.70 cm RV S prime:     12.40 cm/s LEFT ATRIUM           Index        RIGHT ATRIUM           Index LA diam:      3.20 cm 1.62 cm/m   RA Area:     20.70 cm LA Vol (A4C): 27.4 ml 13.91 ml/m  RA Volume:   60.40 ml  30.66 ml/m  AORTIC VALVE AV Area (Vmax):    2.41 cm AV Area (Vmean):   2.32 cm AV Area (VTI):     2.63 cm AV Vmax:           131.00 cm/s AV Vmean:          86.500 cm/s AV VTI:            0.221 m AV Peak Grad:      6.9 mmHg AV Mean Grad:      4.0 mmHg LVOT Vmax:         91.00 cm/s LVOT Vmean:        57.900 cm/s LVOT VTI:          0.168 m LVOT/AV VTI ratio: 0.76  AORTA Ao Root diam: 3.70 cm MITRAL VALVE                TRICUSPID VALVE MV Area (PHT): 4.93 cm     TR Peak grad:   46.2 mmHg MV Decel Time: 154 msec     TR Vmax:        340.00 cm/s MV E velocity: 70.20 cm/s MV A velocity: 116.00 cm/s  SHUNTS MV E/A ratio:  0.61         Systemic VTI:  0.17 m                             Systemic Diam: 2.10 cm Redell Cave MD Electronically signed by Redell Cave MD Signature Date/Time: 07/14/2024/4:13:22 PM    Final         Scheduled Meds:  arformoterol   15 mcg Nebulization BID   aspirin  EC  81 mg Oral Daily   atorvastatin   40 mg Oral QHS   budesonide   0.5 mg Nebulization BID   enoxaparin  (LOVENOX ) injection  40 mg Subcutaneous Q24H   gabapentin   300 mg Oral TID   metoprolol  succinate  50 mg Oral Daily   montelukast   10 mg Oral Daily   pantoprazole   40 mg Oral BID   polyethylene glycol  17 g Oral Daily   Continuous Infusions:   LOS: 2 days  Devaughn KATHEE Ban, MD [facility]   If 7PM-7AM, please contact night-coverage [email or web address] Password [email or web address] 07/16/2024, 1:01 PM

## 2024-07-16 NOTE — Plan of Care (Signed)

## 2024-07-17 ENCOUNTER — Other Ambulatory Visit: Payer: Self-pay | Admitting: Oncology

## 2024-07-17 ENCOUNTER — Inpatient Hospital Stay

## 2024-07-17 ENCOUNTER — Inpatient Hospital Stay: Admitting: Oncology

## 2024-07-17 DIAGNOSIS — J9601 Acute respiratory failure with hypoxia: Secondary | ICD-10-CM | POA: Diagnosis not present

## 2024-07-17 NOTE — Plan of Care (Signed)

## 2024-07-17 NOTE — Progress Notes (Signed)
 Mobility Specialist Progress Note:   07/17/24 1643  Mobility  Activity Ambulated with assistance  Level of Assistance Standby assist, set-up cues, supervision of patient - no hands on  Assistive Device Front wheel walker  Distance Ambulated (ft) 40 ft  Range of Motion/Exercises Active;All extremities  Activity Response Tolerated well  Mobility visit 1 Mobility  Mobility Specialist Start Time (ACUTE ONLY) 1420  Mobility Specialist Stop Time (ACUTE ONLY) 1443  Mobility Specialist Time Calculation (min) (ACUTE ONLY) 23 min   Pt received in bed, agreeable to mobility. Required SBA to stand and ambulate with RW. During ambulation, pt quickly desat 75% on 3L. Nursing assisted with a wheelchair.This mobility specialist placed the pt on 6L via [place], SpO2 slowly improves up to 90%. Pt alert and responsive, c/o lightheadedness. Returned pt to room via wheelchair, RN at bedside for assistance. RN able to wean O2, SpO2 94% on 3L. Symptoms subsided, left pt supine with all needs met.  Sherrilee Ditty Mobility Specialist Please contact via Special educational needs teacher or  Rehab office at [phone]

## 2024-07-17 NOTE — Progress Notes (Signed)
 PROGRESS NOTE    Austin Patterson  FMW:[id number] DOB: 17-Nov-1955 DOA: 07/13/2024 PCP: Autry Grayce LABOR, PA  Outpatient Specialists: oncology, pulmonology    Brief Narrative:   From admission h and p  Austin Patterson is a 68 y.o. male with a PMH significant for COPD, stage IV adenocarcinoma of lung, pleural effusion, pulmonary fibrosis, HTN, CAD, HLD, alcohol dependence, GERD, alpha 1 antitrypsin deficiency, PAF. At baseline, they live alone and are independent with ADLs.  They use a walker to ambulate in home and wheelchair for further distances due to SOB.    They presented from home to the ED on 07/13/2024 with SOB worsening x 2 days.  At baseline, they use 3 L nasal cannula of oxygen  continuously. He noticed that he was having increased dyspnea walking his usual distances to the bathroom and around the house. He says that his breathing treatments are the only thing keeping him going at this time and the results only last a short time. Denies CP, leg swelling, diaphoresis, fever. He admits to being non-adherent with his oxygen  at times when ambulating in his home due to the cumbersome nature of hauling an oxygen  tank with him.  He had a fitting today for radiation and is scheduled to start 5 days of palliative radiation on Oct 6.    In the ED, it was found that they had new O2 requirement increased to 5 L to maintain oxygen  saturations greater than 88% and otherwise stable vital signs. Significant findings included: Na+ 137, K+ 4, Cl- 95, creatinine 0.86.  WBC 5.2, hemoglobin 10.5, platelets 220.  BNP 70.6.  Troponin 7.  LA 1.0> 1.4.  Blood cultures collected.  VBG: pH 7.31, pCO2 70, bicarb 35.2.  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  Stable right lung mass middle lobe with surrounding consolidation.  Moderate right and small left pleural effusions. He denies tobacco  use and states that he is not a regular EtOH consumer. Last drink was about 2 weeks ago.    They were initially treated with DuoNebs, Solu-Medrol .    Patient was admitted to medicine service for further workup and management of acute on chronic hypercarbic respiratory failure as outlined in detail below.   Assessment & Plan:   Principal Problem:   Acute hypoxemic respiratory failure (HCC) Active Problems:   Acute on chronic respiratory failure with hypoxia (HCC)   CAD (coronary artery disease)   Pulmonary fibrosis (HCC)   Essential hypertension   Asthma-COPD overlap syndrome (HCC)   Alcohol abuse   Stage IV adenocarcinoma of lung (HCC)   Paroxysmal atrial fibrillation (HCC)  # Acute on chronic hypoxic respiratory failure Baseline is 2 liters, here requiring 5 initially, 4 today. Multifactorial as below. - [place] O2, wean as able  # Pleural effusion 2/2 malignancy. Contributing to above subacute respiratory failure. 1200 ml therapeutic thoracentesis performed on 9/28 - monitor for reaccumulation, hopeful this will be slow and that radiation next month will help prevent recurrence. Otherwise may need pleurx catheter - follow pleural fluid culture, ngtd  # COPD End-stage per last pulm note. Doesn't describe acute worsening such as wheeze, cough, etc. Nothing acute on CTA. - continue brovana  and pulmicort  per pulm  # Pericardial effusion? On CT. Bnp low. Does have hx cad. TTE unremarkable however  # Stage 4 lung cancer On maintenance gemcitabine  currently, with plans to start radiation therapy next  month. Next dose of chemo scheduled for 9/29. Oncology is aware patient is here.  # Debility Lives alone, endorses worsening weakness, ambulates with a walker at baseline. PT advising SNF, patient consents for this, though may be difficult to find a SNF that will accept patient given active chemotherapy. - TOC bed search underway, apparently has bed offers  # Alcohol abuse No recent  alcohol use, no s/s withdrawal  # CAD S/p stent in 2009. No chest pain. TTE unremarkable - resume plavix  - resume asa  # HTN Borderline hypotensive - hold home amlodipine , lasix   # Chronic pain - home gabapentin , oxycodone   # A-fib Rate controlled, not anticoagulated - home metop   DVT prophylaxis: lovenox  Code Status: dnr/dni Family Communication: son diron updated telephonically 9/27  Level of care: Med-Surg Status is: inpt    Consultants:  pulm  Procedures: Thoracentesis 9/26  Antimicrobials:  S/p azithromycin     Subjective: Reports breathing stable, no complaints. BM today  Objective: Vitals:   07/16/24 2018 07/17/24 0400 07/17/24 0728 07/17/24 0826  BP:  (!) 122/59  112/69  Pulse:  74  95  Resp:    18  Temp:  (!) 97.5 F (36.4 C)  97.9 F (36.6 C)  TempSrc:      SpO2: 95% 94% 96% 96%  Weight:      Height:        Intake/Output Summary (Last 24 hours) at 07/17/2024 1358 Last data filed at 07/17/2024 0936 Gross per 24 hour  Intake 960 ml  Output 3825 ml  Net -2865 ml   Filed Weights   07/13/24 1329  Weight: 85.3 kg    Examination:  General exam: Appears calm and comfortable  Respiratory system: rales at bases, decreased right mid and base Cardiovascular system: S1 & S2 heard, RR Gastrointestinal system: Abdomen is nondistended, soft and nontender.  Central nervous system: Alert and oriented. No focal neurological deficits. Extremities: Symmetric 5 x 5 power. Skin: No rashes, lesions or ulcers Psychiatry: Judgement and insight appear normal. Mood & affect appropriate.     Data Reviewed: I have personally reviewed following labs and imaging studies  CBC: Recent Labs  Lab 07/13/24 1335 07/14/24 0430 07/15/24 0536 07/16/24 0322  WBC 5.2 4.3 7.3 7.4  NEUTROABS 3.9  --   --   --   HGB 10.5* 9.8* 10.2* 10.5*  HCT 34.2* 31.7* 33.7* 33.5*  MCV 101.8* 99.1 100.6* 100.3*  PLT 220 214 240 292   Basic Metabolic Panel: Recent Labs   Lab 07/13/24 1335 07/14/24 0430 07/15/24 0536 07/16/24 0322  NA 137 138 141 136  K 4.0 4.2 4.0 4.5  CL 95* 94* 99 95*  CO2 31 32 35* 33*  GLUCOSE 121* 143* 96 102*  BUN 13 13 17 22   CREATININE 0.86 0.89 0.60* 0.60*  CALCIUM  8.8* 8.5* 8.7* 8.7*   GFR: Estimated Creatinine Clearance: 92.3 mL/min (A) (by C-G formula based on SCr of 0.6 mg/dL (L)). Liver Function Tests: Recent Labs  Lab 07/13/24 1335  AST 23  ALT 18  ALKPHOS 115  BILITOT 0.5  PROT 6.9  ALBUMIN 3.8   No results for input(s): LIPASE, AMYLASE in the last 168 hours. No results for input(s): AMMONIA in the last 168 hours. Coagulation Profile: Recent Labs  Lab 07/13/24 1335  INR 1.0   Cardiac Enzymes: No results for input(s): CKTOTAL, CKMB, CKMBINDEX, TROPONINI in the last 168 hours. BNP (last 3 results) No results for input(s): PROBNP in the last 8760 hours. HbA1C: No  results for input(s): HGBA1C in the last 72 hours. CBG: No results for input(s): GLUCAP in the last 168 hours. Lipid Profile: No results for input(s): CHOL, HDL, LDLCALC, TRIG, CHOLHDL, LDLDIRECT in the last 72 hours. Thyroid  Function Tests: No results for input(s): TSH, T4TOTAL, FREET4, T3FREE, THYROIDAB in the last 72 hours. Anemia Panel: No results for input(s): VITAMINB12, FOLATE, FERRITIN, TIBC, IRON , RETICCTPCT in the last 72 hours. Urine analysis:    Component Value Date/Time   COLORURINE YELLOW (A) 08/06/2023 1145   APPEARANCEUR CLEAR (A) 08/06/2023 1145   LABSPEC 1.019 08/06/2023 1145   PHURINE 6.0 08/06/2023 1145   GLUCOSEU NEGATIVE 08/06/2023 1145   HGBUR NEGATIVE 08/06/2023 1145   BILIRUBINUR NEGATIVE 08/06/2023 1145   KETONESUR NEGATIVE 08/06/2023 1145   PROTEINUR NEGATIVE 08/06/2023 1145   NITRITE NEGATIVE 08/06/2023 1145   LEUKOCYTESUR NEGATIVE 08/06/2023 1145   Sepsis Labs: @LABRCNTIP (procalcitonin:4,lacticidven:4)  ) Recent Results (from the past 240  hours)  Blood Culture (routine x 2)     Status: None (Preliminary result)   Collection Time: 07/13/24  2:26 PM   Specimen: BLOOD  Result Value Ref Range Status   Specimen Description BLOOD BLOOD RIGHT ARM  Final   Special Requests   Final    BOTTLES DRAWN AEROBIC AND ANAEROBIC Blood Culture adequate volume   Culture   Final    NO GROWTH 4 DAYS Performed at [facility], [address] [place], [place] [address]    Report Status PENDING  Incomplete  Blood Culture (routine x 2)     Status: None (Preliminary result)   Collection Time: 07/13/24  2:26 PM   Specimen: BLOOD  Result Value Ref Range Status   Specimen Description BLOOD BLOOD LEFT ARM  Final   Special Requests   Final    BOTTLES DRAWN AEROBIC AND ANAEROBIC Blood Culture adequate volume   Culture   Final    NO GROWTH 4 DAYS Performed at [facility], [address], [place], [place] [address]    Report Status PENDING  Incomplete  Resp panel by RT-PCR (RSV, Flu A&B, Covid) Anterior Nasal Swab     Status: None   Collection Time: 07/13/24  2:26 PM   Specimen: Anterior Nasal Swab  Result Value Ref Range Status   SARS Coronavirus 2 by RT PCR NEGATIVE NEGATIVE Final    Comment: (NOTE) SARS-CoV-2 target nucleic acids are NOT DETECTED.  The SARS-CoV-2 RNA is generally detectable in upper respiratory specimens during the acute phase of infection. The lowest concentration of SARS-CoV-2 viral copies this assay can detect is 138 copies/mL. A negative result does not preclude SARS-Cov-2 infection and should not be used as the sole basis for treatment or other patient management decisions. A negative result may occur with  improper specimen collection/handling, submission of specimen other than nasopharyngeal swab, presence of viral mutation(s) within the areas targeted by this assay, and inadequate number of viral copies(<138 copies/mL). A negative result must be combined with clinical observations, patient  history, and epidemiological information. The expected result is Negative.  Fact Sheet for Patients:  BloggerCourse.com  Fact Sheet for Healthcare Providers:  SeriousBroker.it  This test is no t yet approved or cleared by the [place]  FDA and  has been authorized for detection and/or diagnosis of SARS-CoV-2 by FDA under an Emergency Use Authorization (EUA). This EUA will remain  in effect (meaning this test can be used) for the duration of the COVID-19 declaration under Section 564(b)(1) of the Act, 21 U.S.C.section 360bbb-3(b)(1), unless  the authorization is terminated  or revoked sooner.       Influenza A by PCR NEGATIVE NEGATIVE Final   Influenza B by PCR NEGATIVE NEGATIVE Final    Comment: (NOTE) The Xpert Xpress SARS-CoV-2/FLU/RSV plus assay is intended as an aid in the diagnosis of influenza from Nasopharyngeal swab specimens and should not be used as a sole basis for treatment. Nasal washings and aspirates are unacceptable for Xpert Xpress SARS-CoV-2/FLU/RSV testing.  Fact Sheet for Patients: BloggerCourse.com  Fact Sheet for Healthcare Providers: SeriousBroker.it  This test is not yet approved or cleared by the [place]  FDA and has been authorized for detection and/or diagnosis of SARS-CoV-2 by FDA under an Emergency Use Authorization (EUA). This EUA will remain in effect (meaning this test can be used) for the duration of the COVID-19 declaration under Section 564(b)(1) of the Act, 21 U.S.C. section 360bbb-3(b)(1), unless the authorization is terminated or revoked.     Resp Syncytial Virus by PCR NEGATIVE NEGATIVE Final    Comment: (NOTE) Fact Sheet for Patients: BloggerCourse.com  Fact Sheet for Healthcare Providers: SeriousBroker.it  This test is not yet approved or cleared by the [place]  FDA  and has been authorized for detection and/or diagnosis of SARS-CoV-2 by FDA under an Emergency Use Authorization (EUA). This EUA will remain in effect (meaning this test can be used) for the duration of the COVID-19 declaration under Section 564(b)(1) of the Act, 21 U.S.C. section 360bbb-3(b)(1), unless the authorization is terminated or revoked.  Performed at [facility], [address] [place], [place] [address]   Body fluid culture w Gram Stain     Status: None (Preliminary result)   Collection Time: 07/14/24  4:51 PM   Specimen: Pleura; Body Fluid  Result Value Ref Range Status   Specimen Description   Final    PLEURAL Performed at [facility], [address] [place], [place] [address]    Special Requests   Final    NONE Performed at [facility], [address] [place], [place] [address]    Gram Stain   Final    WBC PRESENT, PREDOMINANTLY MONONUCLEAR NO ORGANISMS SEEN CYTOSPIN SMEAR    Culture   Final    NO GROWTH 3 DAYS Performed at [facility], [address], [place], [place] [address]    Report Status PENDING  Incomplete         Radiology Studies: No results found.       Scheduled Meds:  arformoterol   15 mcg Nebulization BID   aspirin  EC  81 mg Oral Daily   atorvastatin   40 mg Oral QHS   budesonide   0.5 mg Nebulization BID   clopidogrel   75 mg Oral Daily   enoxaparin  (LOVENOX ) injection  40 mg Subcutaneous Q24H   gabapentin   300 mg Oral TID   metoprolol  succinate  50 mg Oral Daily   montelukast   10 mg Oral Daily   pantoprazole   40 mg Oral BID   polyethylene glycol  17 g Oral Daily   Continuous Infusions:   LOS: 3 days     Devaughn KATHEE Ban, MD [facility]   If 7PM-7AM, please contact night-coverage [email or web address] Password [email or web address] 07/17/2024, 1:58 PM

## 2024-07-17 NOTE — Care Management Important Message (Signed)
 Important Message  Patient Details  Name: PRYNCE JACOBER MRN: [id number] Date of Birth: [birth year]   Important Message Given:  Yes - Medicare IM     Charmayne Odell W, CMA 07/17/2024, 2:17 PM

## 2024-07-17 NOTE — TOC Progression Note (Signed)
 Transition of Care [facility]) - Progression Note    Patient Details  Name: Austin Patterson MRN: [id number] Date of Birth: 05/29/1956  Transition [facility] CM/SW Contact  Lauraine JAYSON Carpen, LCSW Phone Number: 07/17/2024, 2:37 PM  Clinical Narrative:  CSW left message for [facility] admissions coordinator asking her to review referral. All other [facility] SNF's have declined. Patient does have a bed offer from [facility] in [place]. If [facility] is unable to offer, will update patient.  Expected Discharge Plan and Services                                               Social Drivers of Health (SDOH) Interventions SDOH Screenings   Food Insecurity: No Food Insecurity (07/13/2024)  Housing: Low Risk  (07/13/2024)  Transportation Needs: No Transportation Needs (07/13/2024)  Utilities: Not At Risk (07/13/2024)  Depression (PHQ2-9): Low Risk  (07/10/2024)  Social Connections: Socially Isolated (07/13/2024)  Tobacco Use: Medium Risk (07/13/2024)    Readmission Risk Interventions    06/14/2023   11:19 AM  Readmission Risk Prevention Plan  Transportation Screening Complete  Medication Review (RN Care Manager) Complete  PCP or Specialist appointment within 3-5 days of discharge Complete  SW Recovery Care/Counseling Consult Complete  Palliative Care Screening Not Applicable  Skilled Nursing Facility Not Applicable

## 2024-07-18 ENCOUNTER — Encounter: Payer: Self-pay | Admitting: Internal Medicine

## 2024-07-18 ENCOUNTER — Encounter: Payer: Self-pay | Admitting: Oncology

## 2024-07-18 ENCOUNTER — Telehealth (HOSPITAL_COMMUNITY): Payer: Self-pay | Admitting: Pharmacy Technician

## 2024-07-18 ENCOUNTER — Other Ambulatory Visit: Payer: Self-pay

## 2024-07-18 ENCOUNTER — Ambulatory Visit

## 2024-07-18 ENCOUNTER — Telehealth: Payer: Self-pay | Admitting: Oncology

## 2024-07-18 ENCOUNTER — Other Ambulatory Visit (HOSPITAL_COMMUNITY): Payer: Self-pay

## 2024-07-18 DIAGNOSIS — J9601 Acute respiratory failure with hypoxia: Secondary | ICD-10-CM | POA: Diagnosis not present

## 2024-07-18 LAB — BODY FLUID CULTURE W GRAM STAIN: Culture: NO GROWTH

## 2024-07-18 LAB — BLOOD GAS, VENOUS
Acid-Base Excess: 6.4 mmol/L — ABNORMAL HIGH (ref 0.0–2.0)
Bicarbonate: 35.2 mmol/L — ABNORMAL HIGH (ref 20.0–28.0)
O2 Saturation: 26.3 %
Patient temperature: 37
pCO2, Ven: 70 mmHg — ABNORMAL HIGH (ref 44–60)
pH, Ven: 7.31 (ref 7.25–7.43)

## 2024-07-18 LAB — CULTURE, BLOOD (ROUTINE X 2)
Culture: NO GROWTH
Culture: NO GROWTH
Special Requests: ADEQUATE
Special Requests: ADEQUATE

## 2024-07-18 MED ORDER — FLUTICASONE-SALMETEROL 115-21 MCG/ACT IN AERO: 2.0000 | INHALATION_SPRAY | Freq: Two times a day (BID) | RESPIRATORY_TRACT | 12 refills | Status: DC

## 2024-07-18 MED ORDER — FLUTICASONE-SALMETEROL 115-21 MCG/ACT IN AERO
2.0000 | INHALATION_SPRAY | Freq: Two times a day (BID) | RESPIRATORY_TRACT | 12 refills | Status: DC
  Filled 2024-07-18: qty 12, 30d supply, fill #0

## 2024-07-18 NOTE — Plan of Care (Signed)

## 2024-07-18 NOTE — Telephone Encounter (Signed)
 Patient Product/process development scientist completed.    The patient is insured through [employer]. Patient has Medicare and is not eligible for a copay card, but may be able to apply for patient assistance or Medicare RX Payment Plan (Patient Must reach out to their plan, if eligible for payment plan), if available.    Ran test claim for Arnunity Ellipta  and the current 30 day co-pay is $51.21.  Ran test claim for Dulera   and the current 30 day co-pay is $117.89  Ran test claim for fluticasone -salmeterol 100-50 mcg  and the current 30 day co-pay is $12.00  This test claim was processed through [employer]- copay amounts may vary at other pharmacies due to [employer], or as the patient moves through the different stages of their insurance plan.     Reyes Sharps, CPHT Pharmacy Technician III Certified Patient Advocate [facility] Patient Advocate Team Direct Number: [phone]  Fax: [phone]

## 2024-07-18 NOTE — Progress Notes (Signed)
 PT Cancellation Note  Patient Details Name: Austin Patterson MRN: [id number] DOB: 07-14-1956   Cancelled Treatment:     PT attempt. Pt politely requested dino return at 10 am after breakfast. dino will return as requested. Acute PT will continue current POC.    Rankin KATHEE Essex 07/18/2024, 8:50 AM

## 2024-07-18 NOTE — Telephone Encounter (Signed)
 Per secure chat, Md stated see pt in 2 wks MD only.  Pt is scheduled for 10/20 for rad, I have added Dr.Yu after this appt.  I called pt and confirmed this appt date/time  Mailed AVS

## 2024-07-18 NOTE — Progress Notes (Addendum)
 Physical Therapy Treatment Patient Details Name: Austin Patterson MRN: [id number] DOB: 1955/11/12 Today's Date: 07/18/2024   History of Present Illness Pt is a 68 y/o M presenting to ED with c/o SOB with activity, desatting to 88% on home O2. CT negative for PE. PMH significant for COPD on 3L at baseline, pulmonary fibrosis, stage IV adenocarcinoma, pleural effusion, HTN, CAD, HLD, alcohol dependence, afib.    PT Comments  Pt was short sitting in recliner upon arrival. Agreeable to session and remains cooperative and pleasant. On 4 L o2 throughout most of session however did require increased to 6L to recover after desaturating to 74%. After pt recovers to > 88% was able to return to 4 L o2 and maintain when in sitting. Pt reports not ambulating community distances for awhile now and will benefit from continued skilled PT at DC to maximize independence and safety with all ADLs. Pt planning to DC home with HHPT to follow. He endorses having all DME needs met.    If plan is discharge home, recommend the following: A little help with walking and/or transfers;A little help with bathing/dressing/bathroom;Assistance with cooking/housework;Assist for transportation;Help with stairs or ramp for entrance     Equipment Recommendations  Other (comment) (pt endorses having all equipment needs met.)       Precautions / Restrictions Precautions Precautions: Fall Recall of Precautions/Restrictions: Intact Restrictions Weight Bearing Restrictions Per Provider Order: No     Mobility  Bed Mobility  General bed mobility comments: In recliner pre/post session    Transfers Overall transfer level: Needs assistance Equipment used: Rolling walker (2 wheels) Transfers: Sit to/from Stand Sit to Stand: Supervision   Ambulation/Gait Ambulation/Gait assistance: Contact guard assist Gait Distance (Feet): 65 Feet Assistive device: Rolling walker (2 wheels) Gait Pattern/deviations: Step-through pattern, Trunk  flexed, Wide base of support Gait velocity: decreased  General Gait Details: Pt ambulate 65 ft on rm air however does desaturate to 74% but quickly recovers with seated rest and increased o2 supply. I dont walk that far at home.   Balance Overall balance assessment: Needs assistance Sitting-balance support: Feet supported Sitting balance-Leahy Scale: Good     Standing balance support: Bilateral upper extremity supported, During functional activity Standing balance-Leahy Scale: Good     Communication Communication Communication: No apparent difficulties  Cognition Arousal: Alert Behavior During Therapy: WFL for tasks assessed/performed   PT - Cognitive impairments: No apparent impairments   PT - Cognition Comments: A&Ox4 Following commands: Intact      Cueing Cueing Techniques: Verbal cues     General Comments General comments (skin integrity, edema, etc.): pt on 4 L o2 however required increase to 6L to recover after ambulation. pt has home pulse oximeter      Pertinent Vitals/Pain Pain Assessment Pain Assessment: No/denies pain Pain Score: 0-No pain Pain Descriptors / Indicators: Sore Pain Intervention(s): Limited activity within patient's tolerance, Monitored during session, Premedicated before session, Repositioned     PT Goals (current goals can now be found in the care plan section) Acute Rehab PT Goals Patient Stated Goal: to get better Progress towards PT goals: Progressing toward goals    Frequency    Min 2X/week       AM-PAC PT 6 Clicks Mobility   Outcome Measure  Help needed turning from your back to your side while in a flat bed without using bedrails?: A Little Help needed moving from lying on your back to sitting on the side of a flat bed without using bedrails?: A Little Help  needed moving to and from a bed to a chair (including a wheelchair)?: A Little Help needed standing up from a chair using your arms (e.g., wheelchair or bedside  chair)?: A Little Help needed to walk in hospital room?: A Little Help needed climbing 3-5 steps with a railing? : A Little 6 Click Score: 18    End of Session Equipment Utilized During Treatment: Oxygen  (On 4 L pre/post session however did require increased) Activity Tolerance: Patient limited by fatigue Patient left: in chair;with call bell/phone within reach Nurse Communication: Mobility status PT Visit Diagnosis: Unsteadiness on feet (R26.81);Other abnormalities of gait and mobility (R26.89);Muscle weakness (generalized) (M62.81);Difficulty in walking, not elsewhere classified (R26.2)     Time: 8997-8985 PT Time Calculation (min) (ACUTE ONLY): 12 min  Charges:    $Gait Training: 8-22 mins PT General Charges $$ ACUTE PT VISIT: 1 Visit                     Rankin Essex PTA 07/18/24, 1:12 PM

## 2024-07-18 NOTE — Discharge Summary (Signed)
 Austin Patterson FMW:[id number] DOB: 1956-03-14 DOA: 07/13/2024  PCP: Autry Grayce LABOR, PA  Admit date: 07/13/2024 Discharge date: 07/18/2024  Time spent: 35 minutes  Recommendations for Outpatient Follow-up:  Close f/u oncology Pulmonology and PCP f/u as well     Discharge Diagnoses:  Principal Problem:   Acute hypoxemic respiratory failure (HCC) Active Problems:   Acute on chronic respiratory failure with hypoxia (HCC)   CAD (coronary artery disease)   Pulmonary fibrosis (HCC)   Essential hypertension   Asthma-COPD overlap syndrome (HCC)   Alcohol abuse   Stage IV adenocarcinoma of lung (HCC)   Paroxysmal atrial fibrillation (HCC)   Discharge Condition: stable  Diet recommendation: heart healthy  Filed Weights   07/13/24 1329  Weight: 85.3 kg    History of present illness:  From admission h and p Austin Patterson is a 68 y.o. male with a PMH significant for COPD, stage IV adenocarcinoma of lung, pleural effusion, pulmonary fibrosis, HTN, CAD, HLD, alcohol dependence, GERD, alpha 1 antitrypsin deficiency, PAF. At baseline, they live alone and are independent with ADLs.  They use a walker to ambulate in home and wheelchair for further distances due to SOB.    They presented from home to the ED on 07/13/2024 with SOB worsening x 2 days.  At baseline, they use 3 L nasal cannula of oxygen  continuously. He noticed that he was having increased dyspnea walking his usual distances to the bathroom and around the house. He says that his breathing treatments are the only thing keeping him going at this time and the results only last a short time. Denies CP, leg swelling, diaphoresis, fever. He admits to being non-adherent with his oxygen  at times when ambulating in his home due to the cumbersome nature of hauling an oxygen  tank with him.  He had a fitting today for radiation and is scheduled to start 5 days of palliative radiation on Oct 6.    In the ED, it was found that they had  new O2 requirement increased to 5 L to maintain oxygen  saturations greater than 88% and otherwise stable vital signs. Significant findings included: Na+ 137, K+ 4, Cl- 95, creatinine 0.86.  WBC 5.2, hemoglobin 10.5, platelets 220.  BNP 70.6.  Troponin 7.  LA 1.0> 1.4.  Blood cultures collected.  VBG: pH 7.31, pCO2 70, bicarb 35.2.  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  Stable right lung mass middle lobe with surrounding consolidation.  Moderate right and small left pleural effusions. He denies tobacco use and states that he is not a regular EtOH consumer. Last drink was about 2 weeks ago.    They were initially treated with DuoNebs, Solu-Medrol .    Patient was admitted to medicine service for further workup and management of acute on chronic hypercarbic respiratory failure as outlined in detail below.  Hospital Course:   # Acute on chronic hypoxic respiratory failure Baseline is 2 liters, here requiring 5 initially, now stable on 3 - Hardy O2    # Pleural effusion 2/2 malignancy. Contributing to above subacute respiratory failure. 1200 ml therapeutic thoracentesis performed on 9/28, this is second thora (first was earlier this month) - monitor for reaccumulation, hopeful this will be slow and that radiation next month will help prevent recurrence. Otherwise may need pleurx catheter - outpt f/u pulmonology   # COPD End-stage per last pulm note. Doesn't describe acute worsening  such as wheeze, cough, etc. Nothing acute on CTA. - on dupixent  outpt - pulm advised adding laba/ics, advair prescribed  # Pericardial effusion? On CT. Bnp low. Does have hx cad. TTE unremarkable however   # Stage 4 lung cancer On maintenance gemcitabine  currently, with plans to start radiation therapy next month. Next dose of chemo scheduled for 9/29, canceled as inpatient. Patient to contact onc  to re-schedule.    # Debility Lives alone, endorses worsening weakness, ambulates with a walker at baseline. PT advising SNF, patient initially consented to this but today requests d/c home, has dme, home health pt/ot ordered  # Alcohol abuse No recent alcohol use, no s/s withdrawal   # CAD S/p stent in 2009. No chest pain. TTE unremarkable - home plavix /asa   # HTN Borderline hypotensive - hold home amlodipine    # Chronic pain - home gabapentin , oxycodone    # A-fib Rate controlled, not anticoagulated - home metop  Procedures: thoracentesis   Consultations: pulmonology  Discharge Exam: Vitals:   07/18/24 0739 07/18/24 0820  BP:  121/71  Pulse:  98  Resp:  19  Temp:  98.9 F (37.2 C)  SpO2: 94% 97%    General: NAD Cardiovascular: RRR Respiratory: CTAB  Discharge Instructions   Discharge Instructions     Diet - low sodium heart healthy   Complete by: As directed    Increase activity slowly   Complete by: As directed       Allergies as of 07/18/2024       Reactions   Amoxicillin  Anaphylaxis   Tizanidine     Feet and ankle swell         Medication List     PAUSE taking these medications    amLODipine  5 MG tablet Wait to take this until your doctor or other care provider tells you to start again. Commonly known as: NORVASC  Take 1 tablet (5 mg total) by mouth at bedtime. Skip the dose if systolic BP less than 130 mmHg       TAKE these medications    acetaminophen  500 MG tablet Commonly known as: TYLENOL  Take 1,000 mg by mouth every 6 (six) hours as needed for moderate pain or headache.   AeroChamber MV inhaler Use as instructed   albuterol  108 (90 Base) MCG/ACT inhaler Commonly known as: VENTOLIN  HFA Inhale 2 puffs into the lungs every 6 (six) hours as needed for wheezing or shortness of breath.   albuterol  (2.5 MG/3ML) 0.083% nebulizer solution Commonly known as: PROVENTIL  Take 2.5 mg by nebulization every 4 (four) hours.    aspirin  EC 81 MG tablet Take 1 tablet (81 mg total) by mouth daily.   atorvastatin  40 MG tablet Commonly known as: LIPITOR Take 1 tablet (40 mg total) by mouth at bedtime.   Calcium  600/Vitamin D  600-10 MG-MCG Tabs Generic drug: Calcium  Carb-Cholecalciferol  Take 1 tablet by mouth 2 (two) times daily.   clopidogrel  75 MG tablet Commonly known as: PLAVIX  Take 1 tablet (75 mg total) by mouth daily.   Dupixent  300 MG/2ML Soaj Generic drug: Dupilumab  Inject 300 mg into the skin every 14 (fourteen) days.   EPINEPHrine  0.3 mg/0.3 mL Soaj injection Commonly known as: EPI-PEN Inject 0.3 mg into the muscle as needed for anaphylaxis.   ferrous sulfate  325 (65 FE) MG tablet Take 1 tablet (325 mg total) by mouth 2 (two) times daily with a meal.   fluticasone -salmeterol 115-21 MCG/ACT inhaler Commonly known as: Advair HFA Inhale 2 puffs into the lungs 2 (two)  times daily.   furosemide  20 MG tablet Commonly known as: LASIX  Take 1 tablet (20 mg total) by mouth daily.   gabapentin  300 MG capsule Commonly known as: NEURONTIN  TAKE 1 CAPSULE BY MOUTH THREE TIMES A DAY   guaiFENesin -codeine  100-10 MG/5ML syrup TAKE 10 ML BY MOUTH 3 TIMES A DAY AS NEEDED FOR COUGH   ipratropium-albuterol  0.5-2.5 (3) MG/3ML Soln Commonly known as: DUONEB INHALE 1 VIAL THROUGH NEBULIZER EVERY 6 HOURS   lidocaine -prilocaine  cream Commonly known as: EMLA  Apply to affected area once   magnesium  oxide 400 (241.3 Mg) MG tablet Commonly known as: MAG-OX Take 1 tablet (400 mg total) by mouth daily.   metoprolol  succinate 50 MG 24 hr tablet Commonly known as: TOPROL -XL Take 50 mg by mouth daily.   montelukast  10 MG tablet Commonly known as: SINGULAIR  Take 10 mg by mouth daily.   multivitamin with minerals Tabs tablet Take 1 tablet by mouth daily.   omega-3 acid ethyl esters 1 g capsule Commonly known as: LOVAZA Take 1 g by mouth daily.   oxyCODONE  5 MG immediate release tablet Commonly known  as: Oxy IR/ROXICODONE  Take 1 tablet (5 mg total) by mouth every 8 (eight) hours as needed for severe pain (pain score 7-10).   pantoprazole  40 MG tablet Commonly known as: PROTONIX  Take 1 tablet (40 mg total) by mouth 2 (two) times daily.   potassium chloride  SA 20 MEQ tablet Commonly known as: KLOR-CON  M Take 20 mEq by mouth daily.   predniSONE  10 MG tablet Commonly known as: DELTASONE  TAKE 1 TABLET (10 MG TOTAL) BY MOUTH DAILY AS NEEDED. TAKE AS DIRECTED.   sucralfate  1 g tablet Commonly known as: Carafate  Take 1 tablet (1 g total) by mouth 3 (three) times daily before meals. Dissolve in 4tbs warm water , swish and swallow       Allergies  Allergen Reactions   Amoxicillin  Anaphylaxis   Tizanidine      Feet and ankle swell     Contact information for follow-up providers     Autry, Robin A, PA Follow up.   Specialty: Physician Assistant Why: Office closed at this time Patient to make own follow up appt  hospital follow up Contact information: [address] [place] [address] [phone]         Tamea Dedra CROME, MD. Go on 08/09/2024.   Specialty: Pulmonary Disease Why: 11:30 Contact information: [address] [place] [address] [phone]         Babara Call, MD Follow up.   Specialty: Oncology Why: Office will call patient and set up appt Contact information: [address] [place] [address] [phone]              Contact information for after-discharge care     Destination     [facility] Preferred SNF .   Service: Skilled Nursing Contact information: [address] [place]  [address] [phone]                      The results of significant diagnostics from this hospitalization (including imaging, microbiology, ancillary and laboratory) are listed below for reference.    Significant Diagnostic Studies: DG Chest Port 1 View Result Date:  07/14/2024 CLINICAL DATA:  Status post thoracentesis, right lung cancer EXAM: PORTABLE CHEST 1 VIEW COMPARISON:  07/13/2024 FINDINGS: Single frontal view of the chest demonstrates stable left chest wall port. The cardiac silhouette is unremarkable. The right middle lobe  and left lower lobe masses seen on recent CT are again identified and not appreciably changed. Near complete resolution of the right pleural effusion after interval thoracentesis. Bullous changes are again seen at the right lung base. No pneumothorax. No acute bony abnormality. IMPRESSION: 1. Near complete resolution of the right pleural effusion after interval thoracentesis. No evidence of pneumothorax. 2. Stable emphysematous changes, with large bullet the right lung base again noted. 3. Stable right middle and left lower lobe masses compatible with history of lung cancer. Electronically Signed   By: Ozell Daring M.D.   On: 07/14/2024 18:42   ECHOCARDIOGRAM COMPLETE Result Date: 07/14/2024    ECHOCARDIOGRAM REPORT   Patient Name:   Austin Patterson Date of Exam: 07/14/2024 Medical Rec #:  [id number]          Height:       67.0 in Accession #:    [id number]         Weight:       188.0 lb Date of Birth:  1956/08/29           BSA:          1.970 m Patient Age:    68 years           BP:           137/74 mmHg Patient Gender: M                  HR:           79 bpm. Exam Location:  [facility] Procedure: 2D Echo, Cardiac Doppler and Color Doppler (Both Spectral and Color            Flow Doppler were utilized during procedure). Indications:     Dyspnea R06.00  History:         Patient has prior history of Echocardiogram examinations, most                  recent 04/04/2023. Signs/Symptoms:Chest Pain; Risk                  Factors:Hypertension and Dyslipidemia. Pulmonary fibrosis.  Sonographer:     Christopher Furnace Referring Phys:  [id number] [facility] Jamahl Lemmons Diagnosing Phys: Redell Cave MD  Sonographer Comments: Apicals are off axis. IMPRESSIONS  1. Left  ventricular ejection fraction, by estimation, is 55 to 60%. The left ventricle has normal function. The left ventricle has no regional wall motion abnormalities. Left ventricular diastolic parameters are consistent with Grade I diastolic dysfunction (impaired relaxation).  2. Right ventricular systolic function is low normal. The right ventricular size is moderately enlarged.  3. The mitral valve is normal in structure. No evidence of mitral valve regurgitation.  4. The aortic valve is calcified. Aortic valve regurgitation is not visualized. Aortic valve sclerosis/calcification is present, without any evidence of aortic stenosis.  5. The inferior vena cava is normal in size with greater than 50% respiratory variability, suggesting right atrial pressure of 3 mmHg. FINDINGS  Left Ventricle: Left ventricular ejection fraction, by estimation, is 55 to 60%. The left ventricle has normal function. The left ventricle has no regional wall motion abnormalities. The left ventricular internal cavity size was normal in size. There is  no left ventricular hypertrophy. Left ventricular diastolic parameters are consistent with Grade I diastolic dysfunction (impaired relaxation). Right Ventricle: The right ventricular size is moderately enlarged. No increase in right ventricular wall thickness. Right ventricular systolic function is low normal. Left Atrium: Left atrial size was  normal in size. Right Atrium: Right atrial size was normal in size. Pericardium: Trivial pericardial effusion is present. Mitral Valve: The mitral valve is normal in structure. No evidence of mitral valve regurgitation. Tricuspid Valve: The tricuspid valve is normal in structure. Tricuspid valve regurgitation is mild. Aortic Valve: The aortic valve is calcified. Aortic valve regurgitation is not visualized. Aortic valve sclerosis/calcification is present, without any evidence of aortic stenosis. Aortic valve mean gradient measures 4.0 mmHg. Aortic valve peak  gradient measures 6.9 mmHg. Aortic valve area, by VTI measures 2.63 cm. Pulmonic Valve: The pulmonic valve was not well visualized. Pulmonic valve regurgitation is not visualized. Aorta: The aortic root is normal in size and structure. Venous: The inferior vena cava is normal in size with greater than 50% respiratory variability, suggesting right atrial pressure of 3 mmHg. IAS/Shunts: No atrial level shunt detected by color flow Doppler.  LEFT VENTRICLE PLAX 2D LVIDd:         5.00 cm   Diastology LVIDs:         3.60 cm   LV e' medial:    7.51 cm/s LV PW:         1.10 cm   LV E/e' medial:  9.3 LV IVS:        1.00 cm   LV e' lateral:   17.50 cm/s LVOT diam:     2.10 cm   LV E/e' lateral: 4.0 LV SV:         58 LV SV Index:   30 LVOT Area:     3.46 cm  RIGHT VENTRICLE RV Basal diam:  4.90 cm RV Mid diam:    4.70 cm RV S prime:     12.40 cm/s LEFT ATRIUM           Index        RIGHT ATRIUM           Index LA diam:      3.20 cm 1.62 cm/m   RA Area:     20.70 cm LA Vol (A4C): 27.4 ml 13.91 ml/m  RA Volume:   60.40 ml  30.66 ml/m  AORTIC VALVE AV Area (Vmax):    2.41 cm AV Area (Vmean):   2.32 cm AV Area (VTI):     2.63 cm AV Vmax:           131.00 cm/s AV Vmean:          86.500 cm/s AV VTI:            0.221 m AV Peak Grad:      6.9 mmHg AV Mean Grad:      4.0 mmHg LVOT Vmax:         91.00 cm/s LVOT Vmean:        57.900 cm/s LVOT VTI:          0.168 m LVOT/AV VTI ratio: 0.76  AORTA Ao Root diam: 3.70 cm MITRAL VALVE                TRICUSPID VALVE MV Area (PHT): 4.93 cm     TR Peak grad:   46.2 mmHg MV Decel Time: 154 msec     TR Vmax:        340.00 cm/s MV E velocity: 70.20 cm/s MV A velocity: 116.00 cm/s  SHUNTS MV E/A ratio:  0.61         Systemic VTI:  0.17 m  Systemic Diam: 2.10 cm Redell Cave MD Electronically signed by Redell Cave MD Signature Date/Time: 07/14/2024/4:13:22 PM    Final    CT Angio Chest PE W and/or Wo Contrast Result Date: 07/13/2024 CLINICAL DATA:   Pulmonary embolism suspected with high probability. Decreased oxygen  saturation after supplemental oxygen  ran out. History of lung cancer. EXAM: CT ANGIOGRAPHY CHEST WITH CONTRAST TECHNIQUE: Multidetector CT imaging of the chest was performed using the standard protocol during bolus administration of intravenous contrast. Multiplanar CT image reconstructions and MIPs were obtained to evaluate the vascular anatomy. RADIATION DOSE REDUCTION: This exam was performed according to the departmental dose-optimization program which includes automated exposure control, adjustment of the mA and/or kV according to patient size and/or use of iterative reconstruction technique. CONTRAST:  75mL OMNIPAQUE  IOHEXOL  350 MG/ML SOLN COMPARISON:  Chest radiograph 07/13/2024. CT chest abdomen and pelvis 06/12/2024 FINDINGS: Cardiovascular: Technically adequate study with good opacification of the central and segmental pulmonary arteries. Mild to moderate motion artifact. No focal filling defects are demonstrated in the pulmonary arteries. No evidence of significant pulmonary embolus. Mild cardiac enlargement with small pericardial effusion. Normal caliber thoracic aorta. No aortic dissection. Calcification of the aorta and coronary arteries. Great vessel origins are patent. Mediastinum/Nodes: Central venous catheter with tip in the low SVC. Esophagus is decompressed. Mediastinal lymph nodes are not pathologically enlarged. Thyroid  gland is unremarkable. Lungs/Pleura: Moderate right and small left pleural effusions, similar to prior study. Diffuse emphysematous changes in the lungs. Large bulla in the right lung base. Right middle lung collapse and consolidation with right middle lung mass measuring 4.4 cm diameter. This measures slightly larger than on the prior study but there may be increased atelectasis in the area. Atelectasis in the lung bases. Mass suggested in the left medial costophrenic angle measuring about 4 cm diameter. This  is similar to previous study. Nodular interstitial septal thickening throughout the lungs but more prominent on the right. This could represent edema, fibrosis, or interstitial tumor spread. No change since prior study. Upper Abdomen: No acute abnormalities. Musculoskeletal: Compression deformities post kyphoplasty at T6 and T10 levels. Degenerative changes in the spine. Old rib fractures. No acute bony abnormalities are appreciated. Review of the MIP images confirms the above findings. IMPRESSION: 1. No evidence of significant pulmonary embolus. 2. Right middle lung mass lesion and left costophrenic angle mass lesion appears grossly unchanged. Adjacent collapse and consolidation in the right middle lung demonstrates mild progression since prior study. 3. Diffuse interstitial pattern to the lungs of nonspecific etiology, similar to prior study. 4. Moderate right and small left pleural effusions are unchanged. Basilar atelectasis in the lungs. 5. No developing metastatic disease is appreciated. 6. Cardiac enlargement with small pericardial effusion. 7. Aortic atherosclerosis. 8. Emphysematous changes in the lungs with large bulla in the right base. Electronically Signed   By: Elsie Gravely M.D.   On: 07/13/2024 17:08   DG Chest Port 1 View Result Date: 07/13/2024 EXAM: 1 VIEW(S) XRAY OF THE CHEST 07/13/2024 02:08:37 PM COMPARISON: 06/21/2024 CLINICAL HISTORY: Questionable sepsis - evaluate for abnormality. Questionable sepsis FINDINGS: LINES, TUBES AND DEVICES: Left chest port with tip in SVC. LUNGS AND PLEURA: Small right pleural effusion. Right middle lobe lung mass is again noted with adjacent large emphysematous bulla as noted on prior PET CT. There are diffuse nodular interstitial opacities throughout both lungs which appeared unchanged from 06/21/2024. Diffuse chronic interstitial markings are again noted which appear unchanged from 06/21/2024. No pulmonary edema. No pneumothorax. HEART AND MEDIASTINUM:  No acute  abnormality of the cardiac and mediastinal silhouettes. BONES AND SOFT TISSUES: No acute osseous abnormality. IMPRESSION: 1. Right middle lobe lung mass with adjacent large emphysematous bulla, as noted on prior PET CT. 2. Small right pleural effusion. increased. 3. Diffuse nodular interstitial opacities throughout both lungs, unchanged from 06/21/2024. Electronically signed by: Waddell Calk MD 07/13/2024 02:50 PM EDT RP Workstation: [id number]   DG Chest Port 1 View Result Date: 06/21/2024 CLINICAL DATA:  Status post right thoracentesis. EXAM: PORTABLE CHEST 1 VIEW COMPARISON:  CT chest 06/12/2024 FINDINGS: Stable heart size and appearance of Port-A-Cath. No definite right pneumothorax following thoracentesis. Some relative lucency overlying the right lower chest corresponds to a large pulmonary bleb seen by CT. No significant residual right pleural fluid. Underlying chronic lung disease. IMPRESSION: No definite right pneumothorax following thoracentesis. Large pulmonary bleb overlying the right lower chest. Electronically Signed   By: Marcey Moan M.D.   On: 06/21/2024 17:05   US  THORACENTESIS ASP PLEURAL SPACE W/IMG GUIDE Result Date: 06/21/2024 INDICATION: 68 year old male with history of bilateral lung cancer stage IV, now with bilateral pleural effusion, right side greater than left. IR was requested for diagnostic and therapeutic thoracentesis. EXAM: ULTRASOUND GUIDED RIGHT-SIDED DIAGNOSTIC AND THERAPEUTIC THORACENTESIS MEDICATIONS: 5 cc of 1% lidocaine  COMPLICATIONS: None immediate. PROCEDURE: An ultrasound guided thoracentesis was thoroughly discussed with the patient and questions answered. The benefits, risks, alternatives and complications were also discussed. The patient understands and wishes to proceed with the procedure. Written consent was obtained. Ultrasound was performed to localize and mark an adequate pocket of fluid in the right chest. The area was then prepped and draped in  the normal sterile fashion. 1% Lidocaine  was used for local anesthesia. Under ultrasound guidance a 6 Fr Safe-T-Centesis catheter was introduced. Thoracentesis was performed. The catheter was removed and a dressing applied. FINDINGS: A total of approximately 700 mL of clear, straw-colored pleural fluid was removed. Samples were sent to the laboratory as requested by the clinical team. IMPRESSION: Successful ultrasound guided right thoracentesis yielding 700 mL of pleural fluid. Procedure performed by Carlin Griffon, PA-C Electronically Signed   By: Juliene Balder M.D.   On: 06/21/2024 14:52    Microbiology: Recent Results (from the past 240 hours)  Blood Culture (routine x 2)     Status: None   Collection Time: 07/13/24  2:26 PM   Specimen: BLOOD  Result Value Ref Range Status   Specimen Description BLOOD BLOOD RIGHT ARM  Final   Special Requests   Final    BOTTLES DRAWN AEROBIC AND ANAEROBIC Blood Culture adequate volume   Culture   Final    NO GROWTH 5 DAYS Performed at [facility], [address], [place], [place] [address]    Report Status 07/18/2024 FINAL  Final  Blood Culture (routine x 2)     Status: None   Collection Time: 07/13/24  2:26 PM   Specimen: BLOOD  Result Value Ref Range Status   Specimen Description BLOOD BLOOD LEFT ARM  Final   Special Requests   Final    BOTTLES DRAWN AEROBIC AND ANAEROBIC Blood Culture adequate volume   Culture   Final    NO GROWTH 5 DAYS Performed at [facility], [address], [place], [place] [address]    Report Status 07/18/2024 FINAL  Final  Resp panel by RT-PCR (RSV, Flu A&B, Covid) Anterior Nasal Swab     Status: None   Collection Time: 07/13/24  2:26 PM   Specimen: Anterior Nasal Swab  Result Value Ref Range Status   SARS Coronavirus 2 by RT PCR NEGATIVE NEGATIVE Final    Comment: (NOTE) SARS-CoV-2 target nucleic acids are NOT DETECTED.  The SARS-CoV-2 RNA is generally detectable in upper respiratory specimens  during the acute phase of infection. The lowest concentration of SARS-CoV-2 viral copies this assay can detect is 138 copies/mL. A negative result does not preclude SARS-Cov-2 infection and should not be used as the sole basis for treatment or other patient management decisions. A negative result may occur with  improper specimen collection/handling, submission of specimen other than nasopharyngeal swab, presence of viral mutation(s) within the areas targeted by this assay, and inadequate number of viral copies(<138 copies/mL). A negative result must be combined with clinical observations, patient history, and epidemiological information. The expected result is Negative.  Fact Sheet for Patients:  BloggerCourse.com  Fact Sheet for Healthcare Providers:  SeriousBroker.it  This test is no t yet approved or cleared by the [place]  FDA and  has been authorized for detection and/or diagnosis of SARS-CoV-2 by FDA under an Emergency Use Authorization (EUA). This EUA will remain  in effect (meaning this test can be used) for the duration of the COVID-19 declaration under Section 564(b)(1) of the Act, 21 U.S.C.section 360bbb-3(b)(1), unless the authorization is terminated  or revoked sooner.       Influenza A by PCR NEGATIVE NEGATIVE Final   Influenza B by PCR NEGATIVE NEGATIVE Final    Comment: (NOTE) The Xpert Xpress SARS-CoV-2/FLU/RSV plus assay is intended as an aid in the diagnosis of influenza from Nasopharyngeal swab specimens and should not be used as a sole basis for treatment. Nasal washings and aspirates are unacceptable for Xpert Xpress SARS-CoV-2/FLU/RSV testing.  Fact Sheet for Patients: BloggerCourse.com  Fact Sheet for Healthcare Providers: SeriousBroker.it  This test is not yet approved or cleared by the [place]  FDA and has been authorized for detection  and/or diagnosis of SARS-CoV-2 by FDA under an Emergency Use Authorization (EUA). This EUA will remain in effect (meaning this test can be used) for the duration of the COVID-19 declaration under Section 564(b)(1) of the Act, 21 U.S.C. section 360bbb-3(b)(1), unless the authorization is terminated or revoked.     Resp Syncytial Virus by PCR NEGATIVE NEGATIVE Final    Comment: (NOTE) Fact Sheet for Patients: BloggerCourse.com  Fact Sheet for Healthcare Providers: SeriousBroker.it  This test is not yet approved or cleared by the [place]  FDA and has been authorized for detection and/or diagnosis of SARS-CoV-2 by FDA under an Emergency Use Authorization (EUA). This EUA will remain in effect (meaning this test can be used) for the duration of the COVID-19 declaration under Section 564(b)(1) of the Act, 21 U.S.C. section 360bbb-3(b)(1), unless the authorization is terminated or revoked.  Performed at [facility], [address] [place], [place] [address]   Body fluid culture w Gram Stain     Status: None   Collection Time: 07/14/24  4:51 PM   Specimen: Pleura; Body Fluid  Result Value Ref Range Status   Specimen Description   Final    PLEURAL Performed at [facility], [address] [place], [place] [address]    Special Requests   Final    NONE Performed at [facility], [address] [place], [place] [address]    Gram Stain   Final    WBC PRESENT, PREDOMINANTLY MONONUCLEAR NO ORGANISMS SEEN CYTOSPIN SMEAR    Culture   Final    NO GROWTH 3  DAYS Performed at [facility], [address] [place], [place] [address]    Report Status 07/18/2024 FINAL  Final     Labs: Basic Metabolic Panel: Recent Labs  Lab 07/13/24 1335 07/14/24 0430 07/15/24 0536 07/16/24 0322  NA 137 138 141 136  K 4.0 4.2 4.0 4.5  CL 95* 94* 99 95*  CO2 31 32 35* 33*  GLUCOSE 121* 143* 96 102*  BUN  13 13 17 22   CREATININE 0.86 0.89 0.60* 0.60*  CALCIUM  8.8* 8.5* 8.7* 8.7*   Liver Function Tests: Recent Labs  Lab 07/13/24 1335  AST 23  ALT 18  ALKPHOS 115  BILITOT 0.5  PROT 6.9  ALBUMIN 3.8   No results for input(s): LIPASE, AMYLASE in the last 168 hours. No results for input(s): AMMONIA in the last 168 hours. CBC: Recent Labs  Lab 07/13/24 1335 07/14/24 0430 07/15/24 0536 07/16/24 0322  WBC 5.2 4.3 7.3 7.4  NEUTROABS 3.9  --   --   --   HGB 10.5* 9.8* 10.2* 10.5*  HCT 34.2* 31.7* 33.7* 33.5*  MCV 101.8* 99.1 100.6* 100.3*  PLT 220 214 240 292   Cardiac Enzymes: No results for input(s): CKTOTAL, CKMB, CKMBINDEX, TROPONINI in the last 168 hours. BNP: BNP (last 3 results) Recent Labs    08/06/23 1038 07/13/24 1335  BNP 118.8* 70.6    ProBNP (last 3 results) No results for input(s): PROBNP in the last 8760 hours.  CBG: No results for input(s): GLUCAP in the last 168 hours.     Signed:  Devaughn KATHEE Ban MD.  [facility] 07/18/2024, 12:39 PM

## 2024-07-18 NOTE — Progress Notes (Signed)
 Occupational Therapy Treatment Patient Details Name: Austin Patterson MRN: [id number] DOB: [birth year] Today's Date: 07/18/2024   History of present illness Pt is a 68 y/o M presenting to ED with c/o SOB with activity, desatting to 88% on home O2. CT negative for PE. PMH significant for COPD on 3L at baseline, pulmonary fibrosis, stage IV adenocarcinoma, pleural effusion, HTN, CAD, HLD, alcohol dependence, afib.   OT comments  Pt seen for OT treatment on this date. Upon arrival to room pt seated in recliner, agreeable to tx. Pt dons/doffs bilateral socks via figure-4 method with MODI. Pt requires supervision for STS from recliner with verbal cues for review of technique to limited pulling walker towards self. Pt amb into BR, completed toilet transfers with CGA progressing to supervision with noted improvements regarding safety awareness. Pt on 3L oxygen  via [place], spo2 levels 91% at rest. Pt desat spo2 79%, bumped to 4L during mobility. Once returned to sitting pt recovered faster than previous sessions, returned to 3L spo2 90%. Pt making good progress toward goals, will continue to follow POC. Discharge recommendation remains appropriate.        If plan is discharge home, recommend the following:  A little help with walking and/or transfers;A little help with bathing/dressing/bathroom;Assistance with cooking/housework;Assist for transportation;Help with stairs or ramp for entrance   Equipment Recommendations  None recommended by OT          Precautions / Restrictions Precautions Precautions: Fall Recall of Precautions/Restrictions: Intact Restrictions Weight Bearing Restrictions Per Provider Order: No       Mobility Bed Mobility               General bed mobility comments: NT in recliner on arrival to room    Transfers Overall transfer level: Needs assistance Equipment used: Rolling walker (2 wheels) Transfers: Sit to/from Stand Sit to Stand: Supervision            General transfer comment: Verbal cues for hand placement, instead of pulling walker towards self     Balance Overall balance assessment: Needs assistance Sitting-balance support: Feet supported Sitting balance-Leahy Scale: Good Sitting balance - Comments: Steady reaching within BOS during LB dressing in sitting   Standing balance support: Bilateral upper extremity supported, Reliant on assistive device for balance Standing balance-Leahy Scale: Fair Standing balance comment: No noted LOB in standing                           ADL either performed or assessed with clinical judgement   ADL Overall ADL's : Needs assistance/impaired     Grooming: Wash/dry face;Set up;Sitting               Lower Body Dressing: Modified independent;Sitting/lateral leans Lower Body Dressing Details (indicate cue type and reason): Figure 4 method on donning/doffing Toilet Transfer: Ambulation;Rolling walker (2 wheels);Supervision/safety;Contact guard assist   Toileting- Clothing Manipulation and Hygiene: Supervision/safety;Sitting/lateral lean       Functional mobility during ADLs: Supervision/safety;Rolling walker (2 wheels) General ADL Comments: Donned socks via figure 4 method with MODI, toilet t/f CGA - progressing to supervision with use of RW (oxygen  tank management througout)     Communication Communication Communication: No apparent difficulties   Cognition Arousal: Alert Behavior During Therapy: WFL for tasks assessed/performed Cognition: No apparent impairments             OT - Cognition Comments: A/Ox4  Following commands: Intact        Cueing   Cueing Techniques: Verbal cues  Exercises Exercises: Other exercises Other Exercises Other Exercises: Edu: Role of OT session, safe ADL completion, self pacing in between tasks and transfers to prevent oxygen  desaturation, energy conservation techniques           General Comments Pt on 3L  oxygen  via , spo2 levels 91% at rest. Pt desat spo2 79%, bumped to 4L during mobility. Once returned to sitting pt recovered faster than previous sessions, returned to 3L spo2 90%.    Pertinent Vitals/ Pain       Pain Assessment Pain Assessment: 0-10 Pain Score: 6  Pain Location: Bilateral shoulders chronic pain Pain Descriptors / Indicators: Sore, Discomfort Pain Intervention(s): Limited activity within patient's tolerance, Monitored during session                                                          Frequency  Min 2X/week        Progress Toward Goals  OT Goals(current goals can now be found in the care plan section)  Progress towards OT goals: Progressing toward goals  Acute Rehab OT Goals OT Goal Formulation: With patient Time For Goal Achievement: 07/29/24 Potential to Achieve Goals: Good ADL Goals Pt Will Perform Grooming: with modified independence;standing Pt Will Perform Lower Body Dressing: with modified independence;sit to/from stand Pt Will Transfer to Toilet: with modified independence;ambulating Pt Will Perform Toileting - Clothing Manipulation and hygiene: with modified independence;sitting/lateral leans   AM-PAC OT 6 Clicks Daily Activity     Outcome Measure   Help from another person eating meals?: None Help from another person taking care of personal grooming?: None Help from another person toileting, which includes using toliet, bedpan, or urinal?: A Little Help from another person bathing (including washing, rinsing, drying)?: A Little Help from another person to put on and taking off regular upper body clothing?: None Help from another person to put on and taking off regular lower body clothing?: None 6 Click Score: 22    End of Session Equipment Utilized During Treatment: Gait belt;Rolling walker (2 wheels);Oxygen   OT Visit Diagnosis: Unsteadiness on feet (R26.81);Other abnormalities of gait and mobility  (R26.89);Repeated falls (R29.6)   Activity Tolerance Patient tolerated treatment well   Patient Left with call bell/phone within reach;in chair   Nurse Communication Mobility status        Time: 8885-8862 OT Time Calculation (min): 23 min  Charges: OT General Charges $OT Visit: 1 Visit OT Treatments $Self Care/Home Management : 23-37 mins  Larraine Colas M.S. OTR/L  07/18/24, 11:46 AM

## 2024-07-18 NOTE — TOC Progression Note (Addendum)
 Transition of Care [facility]) - Progression Note    Patient Details  Name: Austin Patterson MRN: [id number] Date of Birth: [birth year]  [facility] CM/SW Contact  Dalia GORMAN Fuse, RN Phone Number: 07/18/2024, 8:54 AM  Clinical Narrative:     Christiane to start insurance auth for [employer].   Approved Certification# [id number]   10:39 AM: TOC received message from PT advising recs have changed to Home w/ HH.  Patient was active with [facility] before.                 Expected Discharge Plan and Services                                               Social Drivers of Health (SDOH) Interventions SDOH Screenings   Food Insecurity: No Food Insecurity (07/13/2024)  Housing: Low Risk  (07/13/2024)  Transportation Needs: No Transportation Needs (07/13/2024)  Utilities: Not At Risk (07/13/2024)  Depression (PHQ2-9): Low Risk  (07/10/2024)  Social Connections: Socially Isolated (07/13/2024)  Tobacco Use: Medium Risk (07/13/2024)    Readmission Risk Interventions    06/14/2023   11:19 AM  Readmission Risk Prevention Plan  Transportation Screening Complete  Medication Review (RN Care Manager) Complete  PCP or Specialist appointment within 3-5 days of discharge Complete  SW Recovery Care/Counseling Consult Complete  Palliative Care Screening Not Applicable  Skilled Nursing Facility Not Applicable

## 2024-07-19 ENCOUNTER — Other Ambulatory Visit: Payer: Self-pay | Admitting: Oncology

## 2024-07-19 LAB — CYTOLOGY - NON PAP

## 2024-07-24 ENCOUNTER — Ambulatory Visit

## 2024-07-24 DIAGNOSIS — Z510 Encounter for antineoplastic radiation therapy: Secondary | ICD-10-CM | POA: Diagnosis present

## 2024-07-24 DIAGNOSIS — C3491 Malignant neoplasm of unspecified part of right bronchus or lung: Secondary | ICD-10-CM | POA: Diagnosis present

## 2024-07-25 ENCOUNTER — Ambulatory Visit
Admission: RE | Admit: 2024-07-25 | Discharge: 2024-07-25 | Disposition: A | Discharge status: Home / Self Care | Source: Ambulatory Visit | Attending: Radiation Oncology | Admitting: Radiation Oncology

## 2024-07-25 ENCOUNTER — Other Ambulatory Visit: Payer: Self-pay

## 2024-07-25 ENCOUNTER — Ambulatory Visit

## 2024-07-25 DIAGNOSIS — Z510 Encounter for antineoplastic radiation therapy: Secondary | ICD-10-CM | POA: Diagnosis not present

## 2024-07-25 LAB — RAD ONC ARIA SESSION SUMMARY
Course Elapsed Days: 0
Plan Fractions Treated to Date: 1
Plan Prescribed Dose Per Fraction: 5 Gy
Plan Total Fractions Prescribed: 10
Plan Total Prescribed Dose: 50 Gy
Reference Point Dosage Given to Date: 5 Gy
Reference Point Session Dosage Given: 5 Gy
Session Number: 1

## 2024-07-26 ENCOUNTER — Ambulatory Visit: Admitting: Radiation Oncology

## 2024-07-26 ENCOUNTER — Ambulatory Visit
Admission: RE | Admit: 2024-07-26 | Discharge: 2024-07-26 | Disposition: A | Discharge status: Home / Self Care | Source: Ambulatory Visit | Attending: Radiation Oncology | Admitting: Radiation Oncology

## 2024-07-26 ENCOUNTER — Ambulatory Visit

## 2024-07-26 ENCOUNTER — Other Ambulatory Visit: Payer: Self-pay

## 2024-07-26 DIAGNOSIS — Z510 Encounter for antineoplastic radiation therapy: Secondary | ICD-10-CM | POA: Diagnosis not present

## 2024-07-26 LAB — RAD ONC ARIA SESSION SUMMARY
Course Elapsed Days: 1
Plan Fractions Treated to Date: 2
Plan Prescribed Dose Per Fraction: 5 Gy
Plan Total Fractions Prescribed: 10
Plan Total Prescribed Dose: 50 Gy
Reference Point Dosage Given to Date: 10 Gy
Reference Point Session Dosage Given: 5 Gy
Session Number: 2

## 2024-07-27 ENCOUNTER — Other Ambulatory Visit: Payer: Self-pay | Admitting: *Deleted

## 2024-07-27 ENCOUNTER — Inpatient Hospital Stay: Attending: Oncology

## 2024-07-27 ENCOUNTER — Ambulatory Visit

## 2024-07-27 ENCOUNTER — Ambulatory Visit
Admission: RE | Admit: 2024-07-27 | Discharge: 2024-07-27 | Attending: Radiation Oncology | Admitting: Radiation Oncology

## 2024-07-27 ENCOUNTER — Other Ambulatory Visit: Payer: Self-pay

## 2024-07-27 DIAGNOSIS — Z510 Encounter for antineoplastic radiation therapy: Secondary | ICD-10-CM | POA: Diagnosis not present

## 2024-07-27 DIAGNOSIS — C779 Secondary and unspecified malignant neoplasm of lymph node, unspecified: Secondary | ICD-10-CM | POA: Insufficient documentation

## 2024-07-27 DIAGNOSIS — Z7982 Long term (current) use of aspirin: Secondary | ICD-10-CM | POA: Insufficient documentation

## 2024-07-27 DIAGNOSIS — Z87891 Personal history of nicotine dependence: Secondary | ICD-10-CM | POA: Insufficient documentation

## 2024-07-27 DIAGNOSIS — Z79899 Other long term (current) drug therapy: Secondary | ICD-10-CM | POA: Insufficient documentation

## 2024-07-27 DIAGNOSIS — Z7902 Long term (current) use of antithrombotics/antiplatelets: Secondary | ICD-10-CM | POA: Insufficient documentation

## 2024-07-27 DIAGNOSIS — J4489 Other specified chronic obstructive pulmonary disease: Secondary | ICD-10-CM | POA: Insufficient documentation

## 2024-07-27 DIAGNOSIS — Z9221 Personal history of antineoplastic chemotherapy: Secondary | ICD-10-CM | POA: Insufficient documentation

## 2024-07-27 DIAGNOSIS — C3491 Malignant neoplasm of unspecified part of right bronchus or lung: Secondary | ICD-10-CM

## 2024-07-27 DIAGNOSIS — Z23 Encounter for immunization: Secondary | ICD-10-CM | POA: Insufficient documentation

## 2024-07-27 DIAGNOSIS — Z7951 Long term (current) use of inhaled steroids: Secondary | ICD-10-CM | POA: Insufficient documentation

## 2024-07-27 LAB — RAD ONC ARIA SESSION SUMMARY
Course Elapsed Days: 2
Plan Fractions Treated to Date: 3
Plan Prescribed Dose Per Fraction: 5 Gy
Plan Total Fractions Prescribed: 10
Plan Total Prescribed Dose: 50 Gy
Reference Point Dosage Given to Date: 15 Gy
Reference Point Session Dosage Given: 5 Gy
Session Number: 3

## 2024-07-27 MED ORDER — OXYCODONE HCL 5 MG PO TABS: 5.0000 mg | ORAL_TABLET | Freq: Three times a day (TID) | ORAL | 0 refills | Status: DC | PRN

## 2024-07-28 ENCOUNTER — Encounter

## 2024-07-28 ENCOUNTER — Ambulatory Visit
Admission: RE | Admit: 2024-07-28 | Discharge: 2024-07-28 | Disposition: A | Discharge status: Home / Self Care | Source: Ambulatory Visit | Attending: Radiation Oncology | Admitting: Radiation Oncology

## 2024-07-28 ENCOUNTER — Other Ambulatory Visit: Payer: Self-pay

## 2024-07-28 ENCOUNTER — Inpatient Hospital Stay

## 2024-07-28 DIAGNOSIS — Z510 Encounter for antineoplastic radiation therapy: Secondary | ICD-10-CM | POA: Diagnosis not present

## 2024-07-28 LAB — RAD ONC ARIA SESSION SUMMARY
Course Elapsed Days: 3
Plan Fractions Treated to Date: 4
Plan Prescribed Dose Per Fraction: 5 Gy
Plan Total Fractions Prescribed: 10
Plan Total Prescribed Dose: 50 Gy
Reference Point Dosage Given to Date: 20 Gy
Reference Point Session Dosage Given: 5 Gy
Session Number: 4

## 2024-07-31 ENCOUNTER — Other Ambulatory Visit: Payer: Self-pay

## 2024-07-31 ENCOUNTER — Ambulatory Visit

## 2024-07-31 ENCOUNTER — Ambulatory Visit: Admitting: Oncology

## 2024-07-31 ENCOUNTER — Other Ambulatory Visit

## 2024-07-31 ENCOUNTER — Ambulatory Visit
Admission: RE | Admit: 2024-07-31 | Discharge: 2024-07-31 | Disposition: A | Source: Ambulatory Visit | Attending: Radiation Oncology | Admitting: Radiation Oncology

## 2024-07-31 ENCOUNTER — Encounter

## 2024-07-31 DIAGNOSIS — Z510 Encounter for antineoplastic radiation therapy: Secondary | ICD-10-CM | POA: Diagnosis not present

## 2024-07-31 LAB — RAD ONC ARIA SESSION SUMMARY
Course Elapsed Days: 6
Plan Fractions Treated to Date: 5
Plan Prescribed Dose Per Fraction: 5 Gy
Plan Total Fractions Prescribed: 10
Plan Total Prescribed Dose: 50 Gy
Reference Point Dosage Given to Date: 25 Gy
Reference Point Session Dosage Given: 5 Gy
Session Number: 5

## 2024-08-01 ENCOUNTER — Other Ambulatory Visit: Payer: Self-pay

## 2024-08-01 ENCOUNTER — Encounter: Payer: Self-pay | Admitting: Pulmonary Disease

## 2024-08-01 ENCOUNTER — Ambulatory Visit
Admission: RE | Admit: 2024-08-01 | Discharge: 2024-08-01 | Disposition: A | Discharge status: Home / Self Care | Source: Ambulatory Visit | Attending: Radiation Oncology | Admitting: Radiation Oncology

## 2024-08-01 ENCOUNTER — Ambulatory Visit: Admitting: Pulmonary Disease

## 2024-08-01 ENCOUNTER — Ambulatory Visit

## 2024-08-01 VITALS — BP 132/84 | HR 87 | Temp 97.1°F | Ht 67.0 in | Wt 188.0 lb

## 2024-08-01 DIAGNOSIS — J9611 Chronic respiratory failure with hypoxia: Secondary | ICD-10-CM | POA: Diagnosis not present

## 2024-08-01 DIAGNOSIS — J4550 Severe persistent asthma, uncomplicated: Secondary | ICD-10-CM

## 2024-08-01 DIAGNOSIS — J449 Chronic obstructive pulmonary disease, unspecified: Secondary | ICD-10-CM | POA: Diagnosis not present

## 2024-08-01 DIAGNOSIS — Z510 Encounter for antineoplastic radiation therapy: Secondary | ICD-10-CM | POA: Diagnosis not present

## 2024-08-01 DIAGNOSIS — C3491 Malignant neoplasm of unspecified part of right bronchus or lung: Secondary | ICD-10-CM

## 2024-08-01 DIAGNOSIS — E8801 Alpha-1-antitrypsin deficiency: Secondary | ICD-10-CM | POA: Diagnosis not present

## 2024-08-01 DIAGNOSIS — J90 Pleural effusion, not elsewhere classified: Secondary | ICD-10-CM

## 2024-08-01 DIAGNOSIS — Z87891 Personal history of nicotine dependence: Secondary | ICD-10-CM

## 2024-08-01 LAB — RAD ONC ARIA SESSION SUMMARY
Course Elapsed Days: 7
Plan Fractions Treated to Date: 6
Plan Prescribed Dose Per Fraction: 5 Gy
Plan Total Fractions Prescribed: 10
Plan Total Prescribed Dose: 50 Gy
Reference Point Dosage Given to Date: 30 Gy
Reference Point Session Dosage Given: 5 Gy
Session Number: 6

## 2024-08-01 NOTE — Progress Notes (Signed)
 Subjective:    Patient ID: Austin Patterson, male    DOB: [birth year], 68 y.o.   MRN: [id number]  Patient Care Team: Autry Grayce LABOR, PA as PCP - General (Physician Assistant) Darliss Rogue, MD as PCP - Cardiology (Cardiology) Tamea Dedra CROME, MD as Consulting Physician (Pulmonary Disease) Verdene Gills, RN as Oncology Nurse Navigator Lenn Aran, MD as Consulting Physician (Radiation Oncology) Babara Call, MD as Consulting Physician (Oncology) Lenn Aran, MD as Consulting Physician (Radiation Oncology)  Chief Complaint  Patient presents with   COPD    SOB. No wheezing. Cough, dry.     BACKGROUND/INTERVAL:Austin Patterson is a very complex 68 year old former smoker (100 PY) with stage IV very severe COPD, severe persistent asthma and chronic respiratory failure with hypoxia who presents for follow-up of these issues.  I last saw him on 09 February 2024 and at that time he was well compensated from his respiratory issues.  At baseline the patient is very sedentary.  This is due to debilitating dyspnea.  He is compliant with oxygen  at 2 L/min 24/7.  He has a complex respiratory history with very severe asthma with IgE initially recorded at 1,950 this was noted to be directed to Aspergillus fumigatus (ABPA).  He is also a heterozygous alpha-1 antitrypsin MZ phenotype with normal alpha-1 levels.  He has been on Dupixent  for management of his asthma component.  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  Patient is now getting radiation to the left lower lobe mass.  Admitted to [facility] between 25 September through thready September 2025 due to acute on chronic respiratory failure with hypoxia.  Required thoracentesis for right oral effusion.  HPI Discussed the use of AI scribe software for clinical note transcription with the patient, who gave verbal consent to  proceed.  History of Present Illness   Austin Patterson is a 68 year old male with severe COPD who presents for follow-up regarding his respiratory condition and recent treatments.  He has transitioned to using nebulizers and has discontinued Advair. He maintains an ample supply of nebulizer medication, receiving three boxes per pharmacy visit.  He recently completed his last radiation treatment on Monday. A liter of fluid was drained from his right lung during his recent admission, marking the second occurrence of this procedure.  He felt relief of his dyspnea.  He has not had any recurrent symptoms.  He continues to receive Dupixent  injections for his severe persistent asthma, which he finds beneficial, particularly during chemotherapy.     He does not endorse any other symptomatology today.  Review of Systems A 10 point review of systems was performed and it is as noted above otherwise negative.   Patient Active Problem List   Diagnosis Date Noted   Acute hypoxemic respiratory failure (HCC) 07/13/2024   Pleural effusion 06/20/2024   Thrombocytopenia 03/23/2024   Anemia due to antineoplastic chemotherapy 01/27/2024   Encounter for antineoplastic chemotherapy 01/06/2024   Hemoptysis 12/29/2023   Hypocalcemia 09/06/2023   Overweight (BMI 25.0-29.9) 08/08/2023   SIRS (systemic inflammatory response syndrome) (HCC) 08/06/2023   Cancer related pain 07/05/2023   Paroxysmal atrial fibrillation (HCC) 06/16/2023   Sepsis (HCC) 06/12/2023   Macrocytosis 06/08/2023   Stage IV adenocarcinoma of lung (HCC) 05/18/2023   Swelling of right foot 05/11/2023   Heterozygous alpha 1-antitrypsin deficiency (HCC) 04/27/2023   Cough 04/03/2023   Alcohol use 04/03/2023  Constipation 12/10/2022   COPD exacerbation (HCC) 02/23/2022   GERD without esophagitis 02/23/2022   Alpha-1-antitrypsin deficiency carrier 01/10/2021   History of nonmelanoma skin cancer 12/19/2020   Asthma 03/12/2020    Chronic respiratory failure with hypoxia (HCC) 03/12/2020   Hyperlipidemia    Asthma-COPD overlap syndrome (HCC)    Bleeding nose    Alcohol abuse    RUQ abdominal pain    Anaphylaxis 11/01/2019   Hypotension 11/01/2019   CAD (coronary artery disease) 11/01/2019   H/O heart artery stent 11/01/2019   Acute on chronic respiratory failure with hypoxia (HCC)    RUQ pain    Essential hypertension    Pulmonary fibrosis (HCC) 08/30/2019   Hypomagnesemia 03/31/2017   Hiatal hernia    Reflux esophagitis    Hematemesis without nausea    Hyponatremia 09/21/2016   Multifocal pneumonia 12/09/2015    Social History   Tobacco Use   Smoking status: Former    Current packs/day: 0.00    Average packs/day: 2.0 packs/day for 50.0 years (100.0 ttl pk-yrs)    Types: Cigarettes    Start date: 10/13/1965    Quit date: 10/14/2015    Years since quitting: 8.8   Smokeless tobacco: Former  Substance Use Topics   Alcohol use: Yes    Alcohol/week: 56.0 standard drinks of alcohol    Types: 56 Cans of beer per week    Comment: I sit around and drink beer, that's all I got to do    Allergies  Allergen Reactions   Amoxicillin  Anaphylaxis   Tizanidine      Feet and ankle swell     Current Meds  Medication Sig   acetaminophen  (TYLENOL ) 500 MG tablet Take 1,000 mg by mouth every 6 (six) hours as needed for moderate pain or headache.   albuterol  (PROVENTIL ) (2.5 MG/3ML) 0.083% nebulizer solution Take 2.5 mg by nebulization every 4 (four) hours.   albuterol  (VENTOLIN  HFA) 108 (90 Base) MCG/ACT inhaler Inhale 2 puffs into the lungs every 6 (six) hours as needed for wheezing or shortness of breath.   [Paused] amLODipine  (NORVASC ) 5 MG tablet Take 1 tablet (5 mg total) by mouth at bedtime. Skip the dose if systolic BP less than 130 mmHg   aspirin  EC 81 MG tablet Take 1 tablet (81 mg total) by mouth daily.   atorvastatin  (LIPITOR) 40 MG tablet Take 1 tablet (40 mg total) by mouth at bedtime.    azithromycin  (ZITHROMAX ) 500 MG tablet Take 500 mg by mouth daily.   CALCIUM  600/VITAMIN D  600-10 MG-MCG TABS Take 1 tablet by mouth 2 (two) times daily.   clopidogrel  (PLAVIX ) 75 MG tablet Take 1 tablet (75 mg total) by mouth daily.   Dupilumab  (DUPIXENT ) 300 MG/2ML SOAJ Inject 300 mg into the skin every 14 (fourteen) days.   EPINEPHrine  0.3 mg/0.3 mL IJ SOAJ injection Inject 0.3 mg into the muscle as needed for anaphylaxis.   ferrous sulfate  325 (65 FE) MG tablet Take 1 tablet (325 mg total) by mouth 2 (two) times daily with a meal.   furosemide  (LASIX ) 20 MG tablet Take 1 tablet (20 mg total) by mouth daily.   gabapentin  (NEURONTIN ) 300 MG capsule TAKE 1 CAPSULE BY MOUTH THREE TIMES A DAY   guaiFENesin -codeine  100-10 MG/5ML syrup TAKE 10 ML BY MOUTH 3 TIMES A DAY AS NEEDED FOR COUGH   lidocaine -prilocaine  (EMLA ) cream Apply to affected area once   magnesium  oxide (MAG-OX) 400 (241.3 Mg) MG tablet Take 1 tablet (400 mg total) by mouth  daily.   metoprolol  succinate (TOPROL -XL) 50 MG 24 hr tablet Take 50 mg by mouth daily.   montelukast  (SINGULAIR ) 10 MG tablet Take 10 mg by mouth daily.   Multiple Vitamin (MULTIVITAMIN WITH MINERALS) TABS tablet Take 1 tablet by mouth daily.   omega-3 acid ethyl esters (LOVAZA) 1 g capsule Take 1 g by mouth daily.   oxyCODONE  (OXY IR/ROXICODONE ) 5 MG immediate release tablet Take 1 tablet (5 mg total) by mouth every 8 (eight) hours as needed for severe pain (pain score 7-10).   pantoprazole  (PROTONIX ) 40 MG tablet Take 1 tablet (40 mg total) by mouth 2 (two) times daily.   potassium chloride  SA (KLOR-CON  M) 20 MEQ tablet Take 20 mEq by mouth daily.   Spacer/Aero-Holding Chambers (AEROCHAMBER MV) inhaler Use as instructed   sucralfate  (CARAFATE ) 1 g tablet Take 1 tablet (1 g total) by mouth 3 (three) times daily before meals. Dissolve in 4tbs warm water , swish and swallow    Immunization History  Administered Date(s) Administered   Fluad Quad(high Dose 65+)  10/03/2023   Influenza Inj Mdck Quad Pf 08/10/2019   Influenza Inj Mdck Quad With Preservative 07/18/2018   Influenza,inj,Quad PF,6+ Mos 10/15/2015, 08/31/2016   Influenza-Unspecified 08/10/2019, 07/19/2020, 07/19/2022   Moderna SARS-COV2 Booster Vaccination 10/23/2020   Moderna Sars-Covid-2 Vaccination 11/21/2019, 12/21/2019   Pneumococcal Polysaccharide-23 10/15/2015        Objective:     BP 132/84   Pulse 87   Temp (!) 97.1 F (36.2 C)   Ht 5' 7 (1.702 m)   Wt 188 lb (85.3 kg) Comment: per patient in a wheelchair today  SpO2 91% Comment: 2L Cont.  BMI 29.44 kg/m   SpO2: 91 % (2L Cont.)  GENERAL: Chronically ill appearing gentleman, chronic use of accessories, presents in transport chair.  Wearing oxygen  via nasal cannula.  No acute respiratory distress.  No plethora.  HEAD: Normocephalic, atraumatic. EYES: Pupils equal, round, reactive to light.  No scleral icterus. MOUTH: Teeth in poor repair, oral mucosa moist, no thrush. NECK: Supple. No thyromegaly. Trachea midline. No JVD.  No adenopathy. PULMONARY: Increased AP diameter, significant kyphosis. Distant breath sounds.  Faint dry crackles bilaterally.   CARDIOVASCULAR: S1 and S2. Regular rate and rhythm.  Distant heart tones no murmur appreciated. GASTROINTESTINAL: Benign. MUSCULOSKELETAL: No joint deformity, no clubbing, no edema. NEUROLOGIC: No overt focal deficit.  Speech is fluent.  Awake, alert. SKIN: Intact,warm,dry. PSYCH: Mood and behavior normal.      Assessment & Plan:     ICD-10-CM   1. Stage 4 very severe COPD by GOLD classification (HCC)  J44.9     2. Severe persistent asthma without complication (HCC)  J45.50     3. Chronic respiratory failure with hypoxia (HCC)  J96.11     4. Heterozygous alpha 1-antitrypsin deficiency (HCC)  E88.01     5. Adenocarcinoma of right lung, stage 4 (HCC)  C34.91     6. 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 Recently completed last radiation treatment. A liter of fluid was drained from the right lung. Radiation is expected to help prevent fluid reaccumulation by causing the pleural surfaces to adhere. No current signs of fluid reaccumulation on examination. - Order chest CT after completion of radiation to assess for fluid reaccumulation  Severe chronic obstructive  pulmonary disease (COPD) Severe COPD managed with nebulizers. He is not using Advair and reports having an adequate supply of nebulizer medication. Continues Dupixent  injections, which he reports help with symptoms, especially during chemotherapy. - Continue nebulizer treatment - Continue Dupixent  injections     Will see the patient in follow-up in 2 months time call sooner should any new problems arise.  Advised if symptoms do not improve or worsen, to please contact office for sooner follow up or seek emergency care.    I spent 31 minutes of dedicated to the care of this patient on the date of this encounter to include pre-visit review of records, face-to-face time with the patient discussing conditions above, post visit ordering of testing, clinical documentation with the electronic health record, making appropriate referrals as documented, and communicating necessary findings to members of the patients care team.     C. Leita Sanders, MD Advanced Bronchoscopy PCCM [facility]    *This note was generated using voice recognition software/Dragon and/or AI transcription program.  Despite best efforts to proofread, errors can occur which can change the meaning. Any transcriptional errors that result from this process are unintentional and may not be fully corrected at the time of dictation.

## 2024-08-01 NOTE — Patient Instructions (Signed)
 VISIT SUMMARY:  You had a follow-up appointment to discuss your severe COPD and recent treatments for your lung condition. You have transitioned to using nebulizers and have stopped using Advair. You recently completed your last radiation treatment and had fluid drained from your right lung. You are also continuing with Dupixent  shots, which you find helpful, especially during chemotherapy.  YOUR PLAN:  -MALIGNANT NEOPLASM OF LUNG WITH MALIGNANT RIGHT PLEURAL EFFUSION: You have lung cancer with fluid buildup in your right lung. You recently finished radiation therapy, which should help prevent the fluid from coming back. A liter of fluid was drained from your right lung, and there are no current signs of fluid reaccumulation. A chest CT will be ordered to check for any fluid reaccumulation after your radiation treatment.  -SEVERE CHRONIC OBSTRUCTIVE PULMONARY DISEASE (COPD): You have severe COPD, a chronic lung condition that makes it hard to breathe. You are managing it with nebulizers and have stopped using Advair. You have an adequate supply of nebulizer medication and are continuing with Dupixent  injections, which help with your symptoms, especially during chemotherapy.  INSTRUCTIONS:  A chest CT will be ordered after your radiation treatment to check for any fluid reaccumulation in your right lung.

## 2024-08-02 ENCOUNTER — Ambulatory Visit
Admission: RE | Admit: 2024-08-02 | Discharge: 2024-08-02 | Disposition: A | Discharge status: Home / Self Care | Source: Ambulatory Visit | Attending: Radiation Oncology | Admitting: Radiation Oncology

## 2024-08-02 ENCOUNTER — Ambulatory Visit

## 2024-08-02 ENCOUNTER — Other Ambulatory Visit: Payer: Self-pay

## 2024-08-02 DIAGNOSIS — Z510 Encounter for antineoplastic radiation therapy: Secondary | ICD-10-CM | POA: Diagnosis not present

## 2024-08-02 LAB — RAD ONC ARIA SESSION SUMMARY
Course Elapsed Days: 8
Plan Fractions Treated to Date: 7
Plan Prescribed Dose Per Fraction: 5 Gy
Plan Total Fractions Prescribed: 10
Plan Total Prescribed Dose: 50 Gy
Reference Point Dosage Given to Date: 35 Gy
Reference Point Session Dosage Given: 5 Gy
Session Number: 7

## 2024-08-03 ENCOUNTER — Ambulatory Visit

## 2024-08-03 ENCOUNTER — Ambulatory Visit
Admission: RE | Admit: 2024-08-03 | Discharge: 2024-08-03 | Disposition: A | Discharge status: Home / Self Care | Source: Ambulatory Visit | Attending: Radiation Oncology | Admitting: Radiation Oncology

## 2024-08-03 ENCOUNTER — Inpatient Hospital Stay

## 2024-08-03 ENCOUNTER — Other Ambulatory Visit: Payer: Self-pay

## 2024-08-03 DIAGNOSIS — Z510 Encounter for antineoplastic radiation therapy: Secondary | ICD-10-CM | POA: Diagnosis not present

## 2024-08-03 LAB — RAD ONC ARIA SESSION SUMMARY
Course Elapsed Days: 9
Plan Fractions Treated to Date: 8
Plan Prescribed Dose Per Fraction: 5 Gy
Plan Total Fractions Prescribed: 10
Plan Total Prescribed Dose: 50 Gy
Reference Point Dosage Given to Date: 40 Gy
Reference Point Session Dosage Given: 5 Gy
Session Number: 8

## 2024-08-04 ENCOUNTER — Ambulatory Visit
Admission: RE | Admit: 2024-08-04 | Discharge: 2024-08-04 | Disposition: A | Source: Ambulatory Visit | Attending: Radiation Oncology | Admitting: Radiation Oncology

## 2024-08-04 ENCOUNTER — Other Ambulatory Visit: Payer: Self-pay

## 2024-08-04 ENCOUNTER — Inpatient Hospital Stay

## 2024-08-04 DIAGNOSIS — Z510 Encounter for antineoplastic radiation therapy: Secondary | ICD-10-CM | POA: Diagnosis not present

## 2024-08-04 LAB — RAD ONC ARIA SESSION SUMMARY
Course Elapsed Days: 10
Plan Fractions Treated to Date: 9
Plan Prescribed Dose Per Fraction: 5 Gy
Plan Total Fractions Prescribed: 10
Plan Total Prescribed Dose: 50 Gy
Reference Point Dosage Given to Date: 45 Gy
Reference Point Session Dosage Given: 5 Gy
Session Number: 9

## 2024-08-07 ENCOUNTER — Other Ambulatory Visit: Payer: Self-pay

## 2024-08-07 ENCOUNTER — Encounter: Payer: Self-pay | Admitting: Oncology

## 2024-08-07 ENCOUNTER — Inpatient Hospital Stay: Admitting: Oncology

## 2024-08-07 ENCOUNTER — Ambulatory Visit
Admission: RE | Admit: 2024-08-07 | Discharge: 2024-08-07 | Disposition: A | Discharge status: Home / Self Care | Source: Ambulatory Visit | Attending: Radiation Oncology | Admitting: Radiation Oncology

## 2024-08-07 ENCOUNTER — Inpatient Hospital Stay

## 2024-08-07 ENCOUNTER — Ambulatory Visit

## 2024-08-07 VITALS — BP 137/74 | HR 99 | Temp 96.0°F | Resp 18 | Wt 180.3 lb

## 2024-08-07 DIAGNOSIS — Z87891 Personal history of nicotine dependence: Secondary | ICD-10-CM | POA: Diagnosis not present

## 2024-08-07 DIAGNOSIS — C3491 Malignant neoplasm of unspecified part of right bronchus or lung: Secondary | ICD-10-CM | POA: Diagnosis not present

## 2024-08-07 DIAGNOSIS — Z7902 Long term (current) use of antithrombotics/antiplatelets: Secondary | ICD-10-CM | POA: Diagnosis not present

## 2024-08-07 DIAGNOSIS — Z7982 Long term (current) use of aspirin: Secondary | ICD-10-CM | POA: Diagnosis not present

## 2024-08-07 DIAGNOSIS — J4489 Other specified chronic obstructive pulmonary disease: Secondary | ICD-10-CM

## 2024-08-07 DIAGNOSIS — Z9221 Personal history of antineoplastic chemotherapy: Secondary | ICD-10-CM | POA: Diagnosis not present

## 2024-08-07 DIAGNOSIS — Z23 Encounter for immunization: Secondary | ICD-10-CM | POA: Diagnosis not present

## 2024-08-07 DIAGNOSIS — C779 Secondary and unspecified malignant neoplasm of lymph node, unspecified: Secondary | ICD-10-CM | POA: Diagnosis not present

## 2024-08-07 DIAGNOSIS — Z7951 Long term (current) use of inhaled steroids: Secondary | ICD-10-CM | POA: Diagnosis not present

## 2024-08-07 DIAGNOSIS — Z79899 Other long term (current) drug therapy: Secondary | ICD-10-CM | POA: Diagnosis not present

## 2024-08-07 DIAGNOSIS — Z510 Encounter for antineoplastic radiation therapy: Secondary | ICD-10-CM | POA: Diagnosis not present

## 2024-08-07 LAB — RAD ONC ARIA SESSION SUMMARY
Course Elapsed Days: 13
Plan Fractions Treated to Date: 10
Plan Prescribed Dose Per Fraction: 5 Gy
Plan Total Fractions Prescribed: 10
Plan Total Prescribed Dose: 50 Gy
Reference Point Dosage Given to Date: 50 Gy
Reference Point Session Dosage Given: 5 Gy
Session Number: 10

## 2024-08-07 MED ORDER — INFLUENZA VAC SPLIT HIGH-DOSE 0.5 ML IM SUSY
0.5000 mL | PREFILLED_SYRINGE | INTRAMUSCULAR | Status: AC
  Administered 2024-08-07: 0.5 mL via INTRAMUSCULAR
  Filled 2024-08-07: qty 0.5

## 2024-08-07 NOTE — Assessment & Plan Note (Addendum)
 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  s/p dose reduced carboplatin , Taxol  and Keytruda  on 06/01/2023 x 4 cycles, poor PS/hospitalization--> Keytruda  maintenance until Feb 2025 --> PET progression--> 01/05/2024 concurrent chemo RT --> May 2025 CT showed slight decrease of lung mass--> June 2025 Gemcitabine .  Currently on Gemcitabine   as systemic maintenance.   Repeat CT scan showed mixed response. Pleural effusion R>L, decreased right side lung mass, increased left lower lobe mass. -Status post left lower lobe palliative radiation  Plan to resume chemotherapy in 2 weeks-patient's preference

## 2024-08-07 NOTE — Assessment & Plan Note (Signed)
 Chronic respiratory failure on Oxygen .  Continue inhalers.  Follow up with Dr. Tamea

## 2024-08-07 NOTE — Progress Notes (Signed)
 Hematology/Oncology Progress note [phone] [phone]        CHIEF COMPLAINTS/PURPOSE OF CONSULTATION:  Stage IV lung adenocarcinoma.   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  s/p dose reduced carboplatin , Taxol  and Keytruda  on 06/01/2023 x 4 cycles, poor PS/hospitalization--> Keytruda  maintenance until Feb 2025 --> PET progression--> 01/05/2024 concurrent chemo RT --> May 2025 CT showed slight decrease of lung mass--> June 2025 Gemcitabine .  Currently on Gemcitabine   as systemic maintenance.   Repeat CT scan showed mixed response. Pleural effusion R>L, decreased right side lung mass, increased left lower lobe mass. -Status post left lower lobe palliative radiation  Plan to resume chemotherapy in 2 weeks-patient's preference    Asthma-COPD overlap syndrome (HCC) Chronic respiratory failure on Oxygen .  Continue inhalers.  Follow up with Dr. Tamea  Orders Placed This Encounter  Procedures   CBC with Differential (Cancer Center Only)    Standing Status:   Future    Expected Date:   08/21/2024    Expiration Date:   08/21/2025   CMP (Cancer Center only)    Standing Status:   Future    Expected Date:   08/21/2024    Expiration Date:   08/21/2025   CBC with Differential (Cancer Center Only)    Standing Status:   Future    Expected Date:   09/04/2024    Expiration Date:   09/04/2025   CMP (Cancer Center only)    Standing Status:   Future    Expected Date:   09/04/2024    Expiration Date:   09/04/2025   Follow up in 2 weeks  All questions were answered. The patient knows to call the clinic with any problems, questions or concerns.  Zelphia Cap, MD, PhD [facility] Hematology Oncology 08/07/2024     HISTORY OF PRESENTING ILLNESS:  Austin Patterson 68 y.o. male presents to establish care for Stage IV lung adenocarcinoma.  Patient previously followed up with Dr. Clista who left practice. He establish care with on 03/02/2024  I have reviewed his chart and materials related to his cancer extensively and collaborated history with the patient. Summary of oncologic history is as follows: Oncology History  Stage IV adenocarcinoma of lung (HCC)  04/19/2023 Imaging   PET scan showed 1. 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 2. Right lower lobe hypermetabolic pulmonary nodule, favoring synchronous primary over pulmonary metastasis. 3. Incidental findings, including: Aortic atherosclerosis (ICD10-I70.0), coronary artery atherosclerosis and emphysema (ICD10-J43.9). Prostatomegaly. Sinus disease.   05/18/2023 Initial Diagnosis   Sarcomatoid carcinoma of lung (HCC)   05/20/2023 Cancer Staging   Staging form: Lung, AJCC 8th Edition - Clinical: Stage IV (cT4, cN3, cM1) - Signed by Agrawal, Kavita, MD on 05/20/2023 Stage prefix: Initial diagnosis   06/01/2023 - 11/08/2023 Chemotherapy   Patient is on Treatment Plan : LUNG NSCLC Carboplatin  (6) + Paclitaxel  (200) + Pembrolizumab  (200) D1 q21d x 4 cycles / Pembrolizumab  (200) Maintenance D1 q21d     08/30/2023 Imaging   CT chest with contrast improving right middle lobe mass, corresponding to the patient's known primary bronchogenic carcinoma. Small mediastinal and right perihilar nodal metastases are also improving.   Associated bilateral lower lobe pulmonary metastases, overall  improved, although one right lower lobe nodular opacity is mildly progressive. Attention on follow-up is suggested.   Stable bilateral adrenal adenomas.   Aortic Atherosclerosis (ICD10-I70.0) and Emphysema (ICD10-J43.9).   11/26/2023 Imaging   CT  chest abdomen pelvis with contrast 1. Interval progression of disease. The dominant mass within the right middle lobe has increased in size in the interval. There is also been significant increase in size of left lower lobe metastasis. Stable to mild increase in size of metastasis to the posterior right lower lobe. 2. Stable appearance of mediastinal and right hilar lymph nodes. 3. No signs of metastatic disease within the abdomen or pelvis. 4. Aortic Atherosclerosis (ICD10-I70.0) and Emphysema (ICD10-J43.9).   12/14/2023 Imaging   PET scan showed 1. Disease progression as evidenced by hypermetabolic nodules and masses in the lungs bilaterally as well as hypermetabolic metastatic mediastinal, hilar and periportal lymph nodes. 2. Bilateral adrenal adenomas. 3. Gastric wall thickening. 4. Aortic atherosclerosis (ICD10-I70.0). Coronary artery calcification. 5.  Emphysema (ICD10-J43.9)   01/06/2024 - 03/02/2024 Chemotherapy   Patient is on Treatment Plan : LUNG Carboplatin  + Paclitaxel  + XRT q7d     03/17/2024 Imaging   CT chest abdomen pelvis w contrast showed Overall slightly improved from previous examination. The large lung masses involving the middle lobe and left lower lobe appears slightly smaller today. Lymph nodes in the mediastinum, right hilum and upper abdomen are also slightly smaller. No new mass lesion, lymph node enlargement identified. Extensive vascular calcifications with some areas of stenosis particularly the common femoral arteries bilaterally. Please correlate with symptoms. Fatty liver infiltration.   Advanced emphysematous lung changes.   One new small right-sided lung 4 mm nodule identified. Recommend attention on follow-up.     03/23/2024 -  Chemotherapy   Patient is on Treatment Plan : LUNG Gemcitabine  (1000) D1,8 q21d     06/12/2024 Imaging   CT chest abdomen pelvis w contrast showed   1. Interval decreased size of right middle lobe mass and  increased size of left lower lobe mass. 2. New pulmonary edema and large right and trace left pleural effusions with new subpleural rounded consolidations in the right middle and lower lobes, which may represent atelectasis, attention on follow-up. 3. Unchanged 12 mm periportal lymph node. 4. Small paraumbilical hernia contains a loop of nonobstructed small bowel. 5. Aortic Atherosclerosis (ICD10-I70.0). Coronary artery calcifications. Assessment for potential risk factor modification, dietary therapy or pharmacologic therapy may be warranted, if clinically indicated.   07/13/2024 - 07/18/2024 Hospital Admission   Patient was hospitalized due to acute hypoxic respiratory failure, pleural effusion.  Patient had therapeutic thoracentesis done during the admission.  Patient's oxygen  requirement was 5 L initially and improved to 3 L    - 08/07/2024 Radiation Therapy   Patient received palliative radiation to left lower lung    He reports feeling ok today.  + increased SOB to his baseline level. Chronic respiratory failure, patient reports shortness of breath is at the baseline.  No nausea vomiting diarrhea.  He cuts back on alcohol use.   MEDICAL HISTORY:  Past Medical History:  Diagnosis Date   Abdominal pain 12/07/2022   Acute urinary retention 12/07/2022   Cancer (HCC)    Chest pain 01/24/2022   COPD (chronic obstructive pulmonary disease) (HCC)    GERD (gastroesophageal reflux disease)    Hemothorax on right 05/11/2023   Hyperlipidemia    Hypertension    Pleuritic pain 05/10/2023   Pneumothorax after biopsy 05/11/2023   Post procedure discomfort 05/13/2023  Pulmonary fibrosis (HCC) 11/2015    SURGICAL HISTORY: Past Surgical History:  Procedure Laterality Date   COLONOSCOPY     CORONARY STENT PLACEMENT     ESOPHAGOGASTRODUODENOSCOPY (EGD) WITH PROPOFOL  N/A 09/23/2016   Procedure: ESOPHAGOGASTRODUODENOSCOPY (EGD) WITH PROPOFOL ;  Surgeon: Ruel Kung, MD;  Location: [facility]  ENDOSCOPY;  Service: Endoscopy;  Laterality: N/A;   IR IMAGING GUIDED PORT INSERTION  05/26/2023   IR IMAGING GUIDED PORT INSERTION  07/30/2023   IR RADIOLOGIST EVAL & MGMT  07/20/2023   KYPHOPLASTY N/A 03/14/2020   Procedure: T7 & T11 KYPHOPLASTY;  Surgeon: Kathlynn Sharper, MD;  Location: [facility] ORS;  Service: Orthopedics;  Laterality: N/A;   PORT-A-CATH REMOVAL N/A 06/14/2023   Procedure: REMOVAL PORT-A-CATH;  Surgeon: Tye Millet, DO;  Location: [facility] ORS;  Service: General;  Laterality: N/A;   SHOULDER ACROMIOPLASTY      SOCIAL HISTORY: Social History   Socioeconomic History   Marital status: Divorced    Spouse name: Not on file   Number of children: Not on file   Years of education: Not on file   Highest education level: Not on file  Occupational History   Not on file  Tobacco Use   Smoking status: Former    Current packs/day: 0.00    Average packs/day: 2.0 packs/day for 50.0 years (100.0 ttl pk-yrs)    Types: Cigarettes    Start date: 10/13/1965    Quit date: 10/14/2015    Years since quitting: 8.8   Smokeless tobacco: Former  Building services engineer status: Never Used  Substance and Sexual Activity   Alcohol use: Yes    Alcohol/week: 56.0 standard drinks of alcohol    Types: 56 Cans of beer per week    Comment: I sit around and drink beer, that's all I got to do   Drug use: No   Sexual activity: Not Currently  Other Topics Concern   Not on file  Social History Narrative   Not on file   Social Drivers of Health   Financial Resource Strain: Not on file  Food Insecurity: No Food Insecurity (07/13/2024)   Hunger Vital Sign    Worried About Running Out of Food in the Last Year: Never true    Ran Out of Food in the Last Year: Never true  Transportation Needs: No Transportation Needs (07/13/2024)   PRAPARE - Administrator, Civil Service (Medical): No    Lack of Transportation (Non-Medical): No  Physical Activity: Not on file  Stress: Not on file  Social  Connections: Socially Isolated (07/13/2024)   Social Connection and Isolation Panel    Frequency of Communication with Friends and Family: Three times a week    Frequency of Social Gatherings with Friends and Family: Once a week    Attends Religious Services: Never    Database administrator or Organizations: No    Attends Banker Meetings: Never    Marital Status: Divorced  Catering manager Violence: Not At Risk (07/13/2024)   Humiliation, Afraid, Rape, and Kick questionnaire    Fear of Current or Ex-Partner: No    Emotionally Abused: No    Physically Abused: No    Sexually Abused: No    FAMILY HISTORY: Family History  Problem Relation Age of Onset   Heart disease Mother     ALLERGIES:  is allergic to amoxicillin  and tizanidine .  MEDICATIONS:  Current Outpatient Medications  Medication Sig Dispense Refill   acetaminophen  (TYLENOL ) 500 MG tablet Take 1,000  mg by mouth every 6 (six) hours as needed for moderate pain or headache.     albuterol  (PROVENTIL ) (2.5 MG/3ML) 0.083% nebulizer solution Take 2.5 mg by nebulization every 4 (four) hours.     albuterol  (VENTOLIN  HFA) 108 (90 Base) MCG/ACT inhaler Inhale 2 puffs into the lungs every 6 (six) hours as needed for wheezing or shortness of breath. 8 g 11   aspirin  EC 81 MG tablet Take 1 tablet (81 mg total) by mouth daily. 90 tablet 3   atorvastatin  (LIPITOR) 40 MG tablet Take 1 tablet (40 mg total) by mouth at bedtime. 90 tablet 3   CALCIUM  600/VITAMIN D  600-10 MG-MCG TABS Take 1 tablet by mouth 2 (two) times daily.     clopidogrel  (PLAVIX ) 75 MG tablet Take 1 tablet (75 mg total) by mouth daily. 90 tablet 3   Dupilumab  (DUPIXENT ) 300 MG/2ML SOAJ Inject 300 mg into the skin every 14 (fourteen) days. 12 mL 1   EPINEPHrine  0.3 mg/0.3 mL IJ SOAJ injection Inject 0.3 mg into the muscle as needed for anaphylaxis.     gabapentin  (NEURONTIN ) 300 MG capsule TAKE 1 CAPSULE BY MOUTH THREE TIMES A DAY 90 capsule 1    ipratropium-albuterol  (DUONEB) 0.5-2.5 (3) MG/3ML SOLN INHALE 1 VIAL THROUGH NEBULIZER EVERY 6 HOURS 270 mL 2   lidocaine -prilocaine  (EMLA ) cream Apply to affected area once 30 g 5   magnesium  oxide (MAG-OX) 400 (241.3 Mg) MG tablet Take 1 tablet (400 mg total) by mouth daily. 30 tablet 0   metoprolol  succinate (TOPROL -XL) 50 MG 24 hr tablet Take 50 mg by mouth daily.     montelukast  (SINGULAIR ) 10 MG tablet Take 10 mg by mouth daily.     Multiple Vitamin (MULTIVITAMIN WITH MINERALS) TABS tablet Take 1 tablet by mouth daily. 30 tablet 0   omega-3 acid ethyl esters (LOVAZA) 1 g capsule Take 1 g by mouth daily.     oxyCODONE  (OXY IR/ROXICODONE ) 5 MG immediate release tablet Take 1 tablet (5 mg total) by mouth every 8 (eight) hours as needed for severe pain (pain score 7-10). 90 tablet 0   pantoprazole  (PROTONIX ) 40 MG tablet Take 1 tablet (40 mg total) by mouth 2 (two) times daily. 90 tablet 3   potassium chloride  SA (KLOR-CON  M) 20 MEQ tablet Take 20 mEq by mouth daily.     sucralfate  (CARAFATE ) 1 g tablet Take 1 tablet (1 g total) by mouth 3 (three) times daily before meals. Dissolve in 4tbs warm water , swish and swallow 90 tablet 4   [Paused] amLODipine  (NORVASC ) 5 MG tablet Take 1 tablet (5 mg total) by mouth at bedtime. Skip the dose if systolic BP less than 130 mmHg (Patient not taking: Reported on 08/07/2024) 30 tablet 5   azithromycin  (ZITHROMAX ) 500 MG tablet Take 500 mg by mouth daily. (Patient not taking: Reported on 08/07/2024)     ferrous sulfate  325 (65 FE) MG tablet Take 1 tablet (325 mg total) by mouth 2 (two) times daily with a meal. (Patient not taking: Reported on 08/07/2024) 60 tablet 2   fluticasone -salmeterol (ADVAIR HFA) 115-21 MCG/ACT inhaler Inhale 2 puffs into the lungs 2 (two) times daily. (Patient not taking: Reported on 08/07/2024) 12 g 12   furosemide  (LASIX ) 20 MG tablet Take 1 tablet (20 mg total) by mouth daily. (Patient not taking: Reported on 08/07/2024) 3 tablet 0    guaiFENesin -codeine  100-10 MG/5ML syrup TAKE 10 ML BY MOUTH 3 TIMES A DAY AS NEEDED FOR COUGH (Patient not taking: Reported  on 08/07/2024) 120 mL 0   predniSONE  (DELTASONE ) 10 MG tablet TAKE 1 TABLET (10 MG TOTAL) BY MOUTH DAILY AS NEEDED. TAKE AS DIRECTED. (Patient not taking: Reported on 08/07/2024) 30 tablet 4   Spacer/Aero-Holding Chambers (AEROCHAMBER MV) inhaler Use as instructed (Patient not taking: Reported on 08/07/2024) 1 each 0   No current facility-administered medications for this visit.   Facility-Administered Medications Ordered in Other Visits  Medication Dose Route Frequency Provider Last Rate Last Admin   heparin  lock flush 100 unit/mL  500 Units Intravenous Once Nancy Arvin, MD        Review of Systems  Constitutional:  Positive for fatigue. Negative for appetite change, chills and fever.  HENT:   Negative for hearing loss and voice change.   Eyes:  Negative for eye problems and icterus.  Respiratory:  Positive for shortness of breath. Negative for chest tightness and cough.   Cardiovascular:  Negative for chest pain and leg swelling.  Gastrointestinal:  Negative for abdominal distention and abdominal pain.  Endocrine: Negative for hot flashes.  Genitourinary:  Negative for difficulty urinating, dysuria and frequency.   Musculoskeletal:  Negative for arthralgias.  Skin:  Negative for itching and rash.  Neurological:  Negative for light-headedness and numbness.  Hematological:  Negative for adenopathy. Does not bruise/bleed easily.  Psychiatric/Behavioral:  Negative for confusion.      PHYSICAL EXAMINATION: ECOG PERFORMANCE STATUS: 2 - Symptomatic, <50% confined to bed  Vitals:   08/07/24 1102  BP: 137/74  Pulse: 99  Resp: 18  Temp: (!) 96 F (35.6 C)  SpO2: 92%   Filed Weights   08/07/24 1102  Weight: 180 lb 4.8 oz (81.8 kg)    Physical Exam Constitutional:      General: He is not in acute distress.    Appearance: He is not diaphoretic.  HENT:      Head: Normocephalic and atraumatic.     Mouth/Throat:     Pharynx: No oropharyngeal exudate.  Eyes:     General: No scleral icterus.    Pupils: Pupils are equal, round, and reactive to light.  Cardiovascular:     Rate and Rhythm: Normal rate and regular rhythm.  Pulmonary:     Effort: Pulmonary effort is normal. No respiratory distress.     Comments: Decreased breath sounds bilaterally.   On nasal cannula oxygen  Abdominal:     General: Bowel sounds are normal. There is no distension.     Palpations: Abdomen is soft.  Musculoskeletal:        General: Normal range of motion.     Cervical back: Normal range of motion and neck supple.     Comments: Trace edema of lower extremities  bilaterally.    Skin:    General: Skin is warm and dry.     Findings: No erythema.  Neurological:     Mental Status: He is alert and oriented to person, place, and time. Mental status is at baseline.     Motor: No abnormal muscle tone.  Psychiatric:        Mood and Affect: Affect normal.      LABORATORY DATA:  I have reviewed the data as listed    Latest Ref Rng & Units 07/16/2024    3:22 AM 07/15/2024    5:36 AM 07/14/2024    4:30 AM  CBC  WBC 4.0 - 10.5 K/uL 7.4  7.3  4.3   Hemoglobin 13.0 - 17.0 g/dL 89.4  89.7  9.8   Hematocrit 39.0 -  52.0 % 33.5  33.7  31.7   Platelets 150 - 400 K/uL 292  240  214       Latest Ref Rng & Units 07/16/2024    3:22 AM 07/15/2024    5:36 AM 07/14/2024    4:30 AM  CMP  Glucose 70 - 99 mg/dL 897  96  856   BUN 8 - 23 mg/dL 22  17  13    Creatinine 0.61 - 1.24 mg/dL 9.39  9.39  9.10   Sodium 135 - 145 mmol/L 136  141  138   Potassium 3.5 - 5.1 mmol/L 4.5  4.0  4.2   Chloride 98 - 111 mmol/L 95  99  94   CO2 22 - 32 mmol/L 33  35  32   Calcium  8.9 - 10.3 mg/dL 8.7  8.7  8.5      RADIOGRAPHIC STUDIES: I have personally reviewed the radiological images as listed and agreed with the findings in the report. DG Chest Port 1 View Result Date: 07/14/2024 CLINICAL  DATA:  Status post thoracentesis, right lung cancer EXAM: PORTABLE CHEST 1 VIEW COMPARISON:  07/13/2024 FINDINGS: Single frontal view of the chest demonstrates stable left chest wall port. The cardiac silhouette is unremarkable. The right middle lobe and left lower lobe masses seen on recent CT are again identified and not appreciably changed. Near complete resolution of the right pleural effusion after interval thoracentesis. Bullous changes are again seen at the right lung base. No pneumothorax. No acute bony abnormality. IMPRESSION: 1. Near complete resolution of the right pleural effusion after interval thoracentesis. No evidence of pneumothorax. 2. Stable emphysematous changes, with large bullet the right lung base again noted. 3. Stable right middle and left lower lobe masses compatible with history of lung cancer. Electronically Signed   By: Ozell Daring M.D.   On: 07/14/2024 18:42   ECHOCARDIOGRAM COMPLETE Result Date: 07/14/2024    ECHOCARDIOGRAM REPORT   Patient Name:   Austin Patterson Date of Exam: 07/14/2024 Medical Rec #:  [id number]          Height:       67.0 in Accession #:    [id number]         Weight:       188.0 lb Date of Birth:  1955/11/25           BSA:          1.970 m Patient Age:    68 years           BP:           137/74 mmHg Patient Gender: M                  HR:           79 bpm. Exam Location:  [facility] Procedure: 2D Echo, Cardiac Doppler and Color Doppler (Both Spectral and Color            Flow Doppler were utilized during procedure). Indications:     Dyspnea R06.00  History:         Patient has prior history of Echocardiogram examinations, most                  recent 04/04/2023. Signs/Symptoms:Chest Pain; Risk                  Factors:Hypertension and Dyslipidemia. Pulmonary fibrosis.  Sonographer:     Christopher Furnace Referring Phys:  [id number] [facility] Diagnosing Phys: Redell  Agbor-Etang MD  Sonographer Comments: Apicals are off axis. IMPRESSIONS  1. Left ventricular ejection  fraction, by estimation, is 55 to 60%. The left ventricle has normal function. The left ventricle has no regional wall motion abnormalities. Left ventricular diastolic parameters are consistent with Grade I diastolic dysfunction (impaired relaxation).  2. Right ventricular systolic function is low normal. The right ventricular size is moderately enlarged.  3. The mitral valve is normal in structure. No evidence of mitral valve regurgitation.  4. The aortic valve is calcified. Aortic valve regurgitation is not visualized. Aortic valve sclerosis/calcification is present, without any evidence of aortic stenosis.  5. The inferior vena cava is normal in size with greater than 50% respiratory variability, suggesting right atrial pressure of 3 mmHg. FINDINGS  Left Ventricle: Left ventricular ejection fraction, by estimation, is 55 to 60%. The left ventricle has normal function. The left ventricle has no regional wall motion abnormalities. The left ventricular internal cavity size was normal in size. There is  no left ventricular hypertrophy. Left ventricular diastolic parameters are consistent with Grade I diastolic dysfunction (impaired relaxation). Right Ventricle: The right ventricular size is moderately enlarged. No increase in right ventricular wall thickness. Right ventricular systolic function is low normal. Left Atrium: Left atrial size was normal in size. Right Atrium: Right atrial size was normal in size. Pericardium: Trivial pericardial effusion is present. Mitral Valve: The mitral valve is normal in structure. No evidence of mitral valve regurgitation. Tricuspid Valve: The tricuspid valve is normal in structure. Tricuspid valve regurgitation is mild. Aortic Valve: The aortic valve is calcified. Aortic valve regurgitation is not visualized. Aortic valve sclerosis/calcification is present, without any evidence of aortic stenosis. Aortic valve mean gradient measures 4.0 mmHg. Aortic valve peak gradient measures  6.9 mmHg. Aortic valve area, by VTI measures 2.63 cm. Pulmonic Valve: The pulmonic valve was not well visualized. Pulmonic valve regurgitation is not visualized. Aorta: The aortic root is normal in size and structure. Venous: The inferior vena cava is normal in size with greater than 50% respiratory variability, suggesting right atrial pressure of 3 mmHg. IAS/Shunts: No atrial level shunt detected by color flow Doppler.  LEFT VENTRICLE PLAX 2D LVIDd:         5.00 cm   Diastology LVIDs:         3.60 cm   LV e' medial:    7.51 cm/s LV PW:         1.10 cm   LV E/e' medial:  9.3 LV IVS:        1.00 cm   LV e' lateral:   17.50 cm/s LVOT diam:     2.10 cm   LV E/e' lateral: 4.0 LV SV:         58 LV SV Index:   30 LVOT Area:     3.46 cm  RIGHT VENTRICLE RV Basal diam:  4.90 cm RV Mid diam:    4.70 cm RV S prime:     12.40 cm/s LEFT ATRIUM           Index        RIGHT ATRIUM           Index LA diam:      3.20 cm 1.62 cm/m   RA Area:     20.70 cm LA Vol (A4C): 27.4 ml 13.91 ml/m  RA Volume:   60.40 ml  30.66 ml/m  AORTIC VALVE AV Area (Vmax):    2.41 cm AV Area (Vmean):   2.32  cm AV Area (VTI):     2.63 cm AV Vmax:           131.00 cm/s AV Vmean:          86.500 cm/s AV VTI:            0.221 m AV Peak Grad:      6.9 mmHg AV Mean Grad:      4.0 mmHg LVOT Vmax:         91.00 cm/s LVOT Vmean:        57.900 cm/s LVOT VTI:          0.168 m LVOT/AV VTI ratio: 0.76  AORTA Ao Root diam: 3.70 cm MITRAL VALVE                TRICUSPID VALVE MV Area (PHT): 4.93 cm     TR Peak grad:   46.2 mmHg MV Decel Time: 154 msec     TR Vmax:        340.00 cm/s MV E velocity: 70.20 cm/s MV A velocity: 116.00 cm/s  SHUNTS MV E/A ratio:  0.61         Systemic VTI:  0.17 m                             Systemic Diam: 2.10 cm Redell Cave MD Electronically signed by Redell Cave MD Signature Date/Time: 07/14/2024/4:13:22 PM    Final    CT Angio Chest PE W and/or Wo Contrast Result Date: 07/13/2024 CLINICAL DATA:  Pulmonary embolism  suspected with high probability. Decreased oxygen  saturation after supplemental oxygen  ran out. History of lung cancer. EXAM: CT ANGIOGRAPHY CHEST WITH CONTRAST TECHNIQUE: Multidetector CT imaging of the chest was performed using the standard protocol during bolus administration of intravenous contrast. Multiplanar CT image reconstructions and MIPs were obtained to evaluate the vascular anatomy. RADIATION DOSE REDUCTION: This exam was performed according to the departmental dose-optimization program which includes automated exposure control, adjustment of the mA and/or kV according to patient size and/or use of iterative reconstruction technique. CONTRAST:  75mL OMNIPAQUE  IOHEXOL  350 MG/ML SOLN COMPARISON:  Chest radiograph 07/13/2024. CT chest abdomen and pelvis 06/12/2024 FINDINGS: Cardiovascular: Technically adequate study with good opacification of the central and segmental pulmonary arteries. Mild to moderate motion artifact. No focal filling defects are demonstrated in the pulmonary arteries. No evidence of significant pulmonary embolus. Mild cardiac enlargement with small pericardial effusion. Normal caliber thoracic aorta. No aortic dissection. Calcification of the aorta and coronary arteries. Great vessel origins are patent. Mediastinum/Nodes: Central venous catheter with tip in the low SVC. Esophagus is decompressed. Mediastinal lymph nodes are not pathologically enlarged. Thyroid  gland is unremarkable. Lungs/Pleura: Moderate right and small left pleural effusions, similar to prior study. Diffuse emphysematous changes in the lungs. Large bulla in the right lung base. Right middle lung collapse and consolidation with right middle lung mass measuring 4.4 cm diameter. This measures slightly larger than on the prior study but there may be increased atelectasis in the area. Atelectasis in the lung bases. Mass suggested in the left medial costophrenic angle measuring about 4 cm diameter. This is similar to  previous study. Nodular interstitial septal thickening throughout the lungs but more prominent on the right. This could represent edema, fibrosis, or interstitial tumor spread. No change since prior study. Upper Abdomen: No acute abnormalities. Musculoskeletal: Compression deformities post kyphoplasty at T6 and T10 levels. Degenerative changes in the spine. Old rib fractures. No  acute bony abnormalities are appreciated. Review of the MIP images confirms the above findings. IMPRESSION: 1. No evidence of significant pulmonary embolus. 2. Right middle lung mass lesion and left costophrenic angle mass lesion appears grossly unchanged. Adjacent collapse and consolidation in the right middle lung demonstrates mild progression since prior study. 3. Diffuse interstitial pattern to the lungs of nonspecific etiology, similar to prior study. 4. Moderate right and small left pleural effusions are unchanged. Basilar atelectasis in the lungs. 5. No developing metastatic disease is appreciated. 6. Cardiac enlargement with small pericardial effusion. 7. Aortic atherosclerosis. 8. Emphysematous changes in the lungs with large bulla in the right base. Electronically Signed   By: Elsie Gravely M.D.   On: 07/13/2024 17:08   DG Chest Port 1 View Result Date: 07/13/2024 EXAM: 1 VIEW(S) XRAY OF THE CHEST 07/13/2024 02:08:37 PM COMPARISON: 06/21/2024 CLINICAL HISTORY: Questionable sepsis - evaluate for abnormality. Questionable sepsis FINDINGS: LINES, TUBES AND DEVICES: Left chest port with tip in SVC. LUNGS AND PLEURA: Small right pleural effusion. Right middle lobe lung mass is again noted with adjacent large emphysematous bulla as noted on prior PET CT. There are diffuse nodular interstitial opacities throughout both lungs which appeared unchanged from 06/21/2024. Diffuse chronic interstitial markings are again noted which appear unchanged from 06/21/2024. No pulmonary edema. No pneumothorax. HEART AND MEDIASTINUM: No acute  abnormality of the cardiac and mediastinal silhouettes. BONES AND SOFT TISSUES: No acute osseous abnormality. IMPRESSION: 1. Right middle lobe lung mass with adjacent large emphysematous bulla, as noted on prior PET CT. 2. Small right pleural effusion. increased. 3. Diffuse nodular interstitial opacities throughout both lungs, unchanged from 06/21/2024. Electronically signed by: Waddell Calk MD 07/13/2024 02:50 PM EDT RP Workstation: [id number]

## 2024-08-08 ENCOUNTER — Ambulatory Visit: Admitting: Pulmonary Disease

## 2024-08-09 ENCOUNTER — Ambulatory Visit: Admitting: Pulmonary Disease

## 2024-08-09 NOTE — Radiation Completion Notes (Signed)
 Patient Name: Austin Patterson, Austin Patterson MRN: [id number] Date of Birth: 12/25/1955 Referring Physician: ZELPHIA CAP, M.D. Date of Service: 2024-08-09 Radiation Oncologist: Marcey Penton, M.D. 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  He tolerated treatments well at that time..  Recent CT scan of the chest showed decrease in right middle lobe mass which we treated although progressive disease in the left lower lobe.  He has new pulmonary edema and a large right trace left pleural effusion.  He has been referred to radiology for consideration of palliative treatment to the progressive left lower lobe mass.  He is not having any cough hemoptysis or chest tightness.      ==========DELIVERED PLANS==========  First Treatment Date: 2024-07-25 Last Treatment Date: 2024-08-07   Plan Name: Lung_L Site: Lung, Left Technique: IMRT Mode: Photon Dose Per Fraction: 5 Gy Prescribed Dose (Delivered / Prescribed): 50 Gy / 50 Gy Prescribed Fxs (Delivered / Prescribed): 10 / 10     ==========ON TREATMENT VISIT DATES========== 2024-07-25, 2024-08-01     ==========UPCOMING VISITS========== 10/03/2024 [facility] OFFICE VISIT - LINZIE Tamea Dedra CROME, MD  09/04/2024 [facility] INFUSION [facility]- [facility] INFUSION CHAIR 13  09/04/2024 [facility] EST PT CAP ZELPHIA, MD  09/04/2024 [facility] INF PORT FLUSH W/LAB [facility]-PORT FLUSH  09/04/2024 [facility] VAN PATIENT [facility]  VAN  09/04/2024 [facility] ONCOLOGY FOLLOW UP 30 Penton Marcey, MD  08/21/2024 [facility] VAN PATIENT [facility] VAN  08/21/2024 [facility] INFUSION [facility]- [facility] INFUSION CHAIR 17  08/21/2024 [facility] EST PT CAP ZELPHIA, MD  08/21/2024 [facility] INF PORT FLUSH W/LAB [facility]-PORT FLUSH        ==========APPENDIX - ON TREATMENT VISIT NOTES==========   See weekly On Treatment Notes in Epic for details in the Media tab (listed as Progress notes on the On Treatment Visit Dates listed above).

## 2024-08-21 ENCOUNTER — Inpatient Hospital Stay

## 2024-08-21 ENCOUNTER — Inpatient Hospital Stay: Attending: Oncology | Admitting: Oncology

## 2024-08-21 ENCOUNTER — Inpatient Hospital Stay: Attending: Oncology

## 2024-08-21 ENCOUNTER — Encounter: Payer: Self-pay | Admitting: Oncology

## 2024-08-21 VITALS — BP 120/72 | HR 90

## 2024-08-21 VITALS — BP 130/77 | HR 88 | Temp 96.9°F | Resp 18 | Wt 182.0 lb

## 2024-08-21 DIAGNOSIS — J4489 Other specified chronic obstructive pulmonary disease: Secondary | ICD-10-CM | POA: Diagnosis not present

## 2024-08-21 DIAGNOSIS — C779 Secondary and unspecified malignant neoplasm of lymph node, unspecified: Secondary | ICD-10-CM | POA: Insufficient documentation

## 2024-08-21 DIAGNOSIS — J90 Pleural effusion, not elsewhere classified: Secondary | ICD-10-CM | POA: Diagnosis not present

## 2024-08-21 DIAGNOSIS — C3491 Malignant neoplasm of unspecified part of right bronchus or lung: Secondary | ICD-10-CM | POA: Diagnosis not present

## 2024-08-21 DIAGNOSIS — Z5111 Encounter for antineoplastic chemotherapy: Secondary | ICD-10-CM | POA: Insufficient documentation

## 2024-08-21 DIAGNOSIS — Z87891 Personal history of nicotine dependence: Secondary | ICD-10-CM | POA: Diagnosis not present

## 2024-08-21 LAB — CBC WITH DIFFERENTIAL (CANCER CENTER ONLY)
Abs Immature Granulocytes: 0.03 K/uL (ref 0.00–0.07)
Basophils Absolute: 0 K/uL (ref 0.0–0.1)
Basophils Relative: 0 %
Eosinophils Absolute: 0 K/uL (ref 0.0–0.5)
Eosinophils Relative: 0 %
HCT: 34.1 % — ABNORMAL LOW (ref 39.0–52.0)
Hemoglobin: 10.6 g/dL — ABNORMAL LOW (ref 13.0–17.0)
Immature Granulocytes: 0 %
Lymphocytes Relative: 3 %
Lymphs Abs: 0.3 K/uL — ABNORMAL LOW (ref 0.7–4.0)
MCH: 28.9 pg (ref 26.0–34.0)
MCHC: 31.1 g/dL (ref 30.0–36.0)
MCV: 92.9 fL (ref 80.0–100.0)
Monocytes Absolute: 0.7 K/uL (ref 0.1–1.0)
Monocytes Relative: 9 %
Neutro Abs: 6.7 K/uL (ref 1.7–7.7)
Neutrophils Relative %: 88 %
Platelet Count: 247 K/uL (ref 150–400)
RBC: 3.67 MIL/uL — ABNORMAL LOW (ref 4.22–5.81)
RDW: 17 % — ABNORMAL HIGH (ref 11.5–15.5)
WBC Count: 7.7 K/uL (ref 4.0–10.5)
nRBC: 0 % (ref 0.0–0.2)

## 2024-08-21 LAB — CMP (CANCER CENTER ONLY)
ALT: 11 U/L (ref 0–44)
AST: 22 U/L (ref 15–41)
Albumin: 3.8 g/dL (ref 3.5–5.0)
Alkaline Phosphatase: 83 U/L (ref 38–126)
Anion gap: 8 (ref 5–15)
BUN: 14 mg/dL (ref 8–23)
CO2: 26 mmol/L (ref 22–32)
Calcium: 8.5 mg/dL — ABNORMAL LOW (ref 8.9–10.3)
Chloride: 100 mmol/L (ref 98–111)
Creatinine: 0.74 mg/dL (ref 0.61–1.24)
GFR, Estimated: >60 mL/min (ref >60)
Glucose, Bld: 110 mg/dL — ABNORMAL HIGH (ref 70–99)
Potassium: 3.6 mmol/L (ref 3.5–5.1)
Sodium: 134 mmol/L — ABNORMAL LOW (ref 135–145)
Total Bilirubin: 0.5 mg/dL (ref 0.0–1.2)
Total Protein: 6.9 g/dL (ref 6.5–8.1)

## 2024-08-21 MED ORDER — SODIUM CHLORIDE 0.9 % IV SOLN
1000.0000 mg/m2 | Freq: Once | INTRAVENOUS | Status: AC
  Administered 2024-08-21: 1976 mg via INTRAVENOUS
  Filled 2024-08-21: qty 51.97

## 2024-08-21 MED ORDER — SODIUM CHLORIDE 0.9 % IV SOLN
INTRAVENOUS | Status: DC
  Filled 2024-08-21: qty 250

## 2024-08-21 MED ORDER — PROCHLORPERAZINE MALEATE 10 MG PO TABS
10.0000 mg | ORAL_TABLET | Freq: Once | ORAL | Status: AC
  Administered 2024-08-21: 10 mg via ORAL
  Filled 2024-08-21: qty 1

## 2024-08-21 NOTE — Assessment & Plan Note (Signed)
 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  s/p dose reduced carboplatin , Taxol  and Keytruda  on 06/01/2023 x 4 cycles, poor PS/hospitalization--> Keytruda  maintenance until Feb 2025 --> PET progression--> 01/05/2024 concurrent chemo RT --> May 2025 CT showed slight decrease of lung mass--> June 2025 Gemcitabine .  Currently on Gemcitabine   as systemic maintenance.   Repeat CT scan showed mixed response. Pleural effusion R>L, decreased right side lung mass, increased left lower lobe mass. -Status post left lower lobe palliative radiation  Labs are reviewed and discussed with patient. Proceed with Gemcitabine .

## 2024-08-21 NOTE — Assessment & Plan Note (Signed)
 Increased pleural effusion, likely contributing to worse of SOB. Discussed with pulm, right side effusion is loculated.  S/p thoracentesis by IR. Cytology is negative.  Pleural fluid LDH/serum LDH ratio > 0.6 - exudate.

## 2024-08-21 NOTE — Assessment & Plan Note (Signed)
 Chronic respiratory failure on Oxygen .  Continue inhalers.  Follow up with Dr. Tamea

## 2024-08-21 NOTE — Progress Notes (Signed)
 Hematology/Oncology Progress note [phone] [phone]        CHIEF COMPLAINTS/PURPOSE OF CONSULTATION:  Stage IV lung adenocarcinoma.   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  s/p dose reduced carboplatin , Taxol  and Keytruda  on 06/01/2023 x 4 cycles, poor PS/hospitalization--> Keytruda  maintenance until Feb 2025 --> PET progression--> 01/05/2024 concurrent chemo RT --> May 2025 CT showed slight decrease of lung mass--> June 2025 Gemcitabine .  Currently on Gemcitabine   as systemic maintenance.   Repeat CT scan showed mixed response. Pleural effusion R>L, decreased right side lung mass, increased left lower lobe mass. -Status post left lower lobe palliative radiation  Labs are reviewed and discussed with patient. Proceed with Gemcitabine .     Asthma-COPD overlap syndrome (HCC) Chronic respiratory failure on Oxygen .  Continue inhalers.  Follow up with Dr. Tamea  Encounter for antineoplastic chemotherapy Chemotherapy plan as listed above.   Pleural effusion Increased pleural effusion, likely contributing to worse of SOB. Discussed with pulm, right side effusion is loculated.  S/p thoracentesis by IR. Cytology is negative.  Pleural fluid LDH/serum LDH ratio > 0.6 - exudate.    Orders Placed This Encounter  Procedures   DG Chest 2 View    Standing Status:   Future    Expected Date:   08/21/2024    Expiration Date:   08/21/2025    Reason for Exam (SYMPTOM  OR DIAGNOSIS REQUIRED):   lung cancer, pleural effusion follow up    Preferred imaging location?:   [facility]   CBC with Differential (Cancer Center Only)    Standing Status:   Future    Expected Date:   09/18/2024    Expiration Date:    09/18/2025   CMP (Cancer Center only)    Standing Status:   Future    Expected Date:   09/18/2024    Expiration Date:   09/18/2025   CBC with Differential (Cancer Center Only)    Standing Status:   Future    Expected Date:   10/02/2024    Expiration Date:   10/02/2025   CMP (Cancer Center only)    Standing Status:   Future    Expected Date:   10/02/2024    Expiration Date:   10/02/2025   Follow up in 2 weeks  All questions were answered. The patient knows to call the clinic with any problems, questions or concerns.  Zelphia Cap, MD, PhD [facility] Hematology Oncology 08/21/2024    HISTORY OF PRESENTING ILLNESS:  Austin Patterson 68 y.o. male presents to establish care for Stage IV lung adenocarcinoma.  Patient previously followed up with Dr. Clista who left practice. He establish care with on 03/02/2024  I have reviewed his chart and materials related to his cancer extensively and collaborated history with the patient. Summary of oncologic history is as follows: Oncology History  Stage IV adenocarcinoma of lung (HCC)  04/19/2023 Imaging   PET scan showed 1. 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 2. Right lower lobe hypermetabolic pulmonary nodule, favoring synchronous primary over pulmonary metastasis. 3. Incidental findings, including: Aortic atherosclerosis (ICD10-I70.0), coronary artery atherosclerosis and emphysema (ICD10-J43.9). Prostatomegaly. Sinus disease.   05/18/2023 Initial Diagnosis   Sarcomatoid carcinoma of  lung (HCC)   05/20/2023 Cancer Staging   Staging form: Lung, AJCC 8th Edition - Clinical: Stage IV (cT4, cN3, cM1) - Signed by Agrawal, Kavita, MD on 05/20/2023 Stage prefix: Initial diagnosis   06/01/2023 - 11/08/2023 Chemotherapy   Patient is on Treatment Plan : LUNG NSCLC Carboplatin  (6) + Paclitaxel  (200) + Pembrolizumab  (200) D1 q21d x 4  cycles / Pembrolizumab  (200) Maintenance D1 q21d     08/30/2023 Imaging   CT chest with contrast improving right middle lobe mass, corresponding to the patient's known primary bronchogenic carcinoma. Small mediastinal and right perihilar nodal metastases are also improving.   Associated bilateral lower lobe pulmonary metastases, overall improved, although one right lower lobe nodular opacity is mildly progressive. Attention on follow-up is suggested.   Stable bilateral adrenal adenomas.   Aortic Atherosclerosis (ICD10-I70.0) and Emphysema (ICD10-J43.9).   11/26/2023 Imaging   CT chest abdomen pelvis with contrast 1. Interval progression of disease. The dominant mass within the right middle lobe has increased in size in the interval. There is also been significant increase in size of left lower lobe metastasis. Stable to mild increase in size of metastasis to the posterior right lower lobe. 2. Stable appearance of mediastinal and right hilar lymph nodes. 3. No signs of metastatic disease within the abdomen or pelvis. 4. Aortic Atherosclerosis (ICD10-I70.0) and Emphysema (ICD10-J43.9).   12/14/2023 Imaging   PET scan showed 1. Disease progression as evidenced by hypermetabolic nodules and masses in the lungs bilaterally as well as hypermetabolic metastatic mediastinal, hilar and periportal lymph nodes. 2. Bilateral adrenal adenomas. 3. Gastric wall thickening. 4. Aortic atherosclerosis (ICD10-I70.0). Coronary artery calcification. 5.  Emphysema (ICD10-J43.9)   01/06/2024 - 03/02/2024 Chemotherapy   Patient is on Treatment Plan : LUNG Carboplatin  + Paclitaxel  + XRT q7d     03/17/2024 Imaging   CT chest abdomen pelvis w contrast showed Overall slightly improved from previous examination. The large lung masses involving the middle lobe and left lower lobe appears slightly smaller today. Lymph nodes in the mediastinum, right hilum and upper abdomen are also slightly smaller. No new  mass lesion, lymph node enlargement identified. Extensive vascular calcifications with some areas of stenosis particularly the common femoral arteries bilaterally. Please correlate with symptoms. Fatty liver infiltration.   Advanced emphysematous lung changes.   One new small right-sided lung 4 mm nodule identified. Recommend attention on follow-up.     03/23/2024 -  Chemotherapy   Patient is on Treatment Plan : LUNG Gemcitabine  (1000) D1,8 q21d     06/12/2024 Imaging   CT chest abdomen pelvis w contrast showed   1. Interval decreased size of right middle lobe mass and increased size of left lower lobe mass. 2. New pulmonary edema and large right and trace left pleural effusions with new subpleural rounded consolidations in the right middle and lower lobes, which may represent atelectasis, attention on follow-up. 3. Unchanged 12 mm periportal lymph node. 4. Small paraumbilical hernia contains a loop of nonobstructed small bowel. 5. Aortic Atherosclerosis (ICD10-I70.0). Coronary artery calcifications. Assessment for potential risk factor modification, dietary therapy or pharmacologic therapy may be warranted, if clinically indicated.   07/13/2024 - 07/18/2024 Hospital Admission   Patient was hospitalized due to acute hypoxic respiratory failure, pleural effusion.  Patient had therapeutic thoracentesis done during the admission.  Patient's oxygen  requirement was 5 L initially and improved to 3 L    - 08/07/2024 Radiation Therapy   Patient received palliative radiation to left lower lung    He reports feeling  ok today.  + chronic SOB to his baseline level. Chronic respiratory failure on nasal cannula oxygen .   No nausea vomiting diarrhea.  He cuts back on alcohol use.   MEDICAL HISTORY:  Past Medical History:  Diagnosis Date   Abdominal pain 12/07/2022   Acute urinary retention 12/07/2022   Cancer (HCC)    Chest pain 01/24/2022   COPD (chronic obstructive pulmonary disease)  (HCC)    GERD (gastroesophageal reflux disease)    Hemothorax on right 05/11/2023   Hyperlipidemia    Hypertension    Pleuritic pain 05/10/2023   Pneumothorax after biopsy 05/11/2023   Post procedure discomfort 05/13/2023   Pulmonary fibrosis (HCC) 11/2015    SURGICAL HISTORY: Past Surgical History:  Procedure Laterality Date   COLONOSCOPY     CORONARY STENT PLACEMENT     ESOPHAGOGASTRODUODENOSCOPY (EGD) WITH PROPOFOL  N/A 09/23/2016   Procedure: ESOPHAGOGASTRODUODENOSCOPY (EGD) WITH PROPOFOL ;  Surgeon: Ruel Kung, MD;  Location: [facility];  Service: Endoscopy;  Laterality: N/A;   IR IMAGING GUIDED PORT INSERTION  05/26/2023   IR IMAGING GUIDED PORT INSERTION  07/30/2023   IR RADIOLOGIST EVAL & MGMT  07/20/2023   KYPHOPLASTY N/A 03/14/2020   Procedure: T7 & T11 KYPHOPLASTY;  Surgeon: Kathlynn Sharper, MD;  Location: [facility] ORS;  Service: Orthopedics;  Laterality: N/A;   PORT-A-CATH REMOVAL N/A 06/14/2023   Procedure: REMOVAL PORT-A-CATH;  Surgeon: Tye Millet, DO;  Location: [facility] ORS;  Service: General;  Laterality: N/A;   SHOULDER ACROMIOPLASTY      SOCIAL HISTORY: Social History   Socioeconomic History   Marital status: Divorced    Spouse name: Not on file   Number of children: Not on file   Years of education: Not on file   Highest education level: Not on file  Occupational History   Not on file  Tobacco Use   Smoking status: Former    Current packs/day: 0.00    Average packs/day: 2.0 packs/day for 50.0 years (100.0 ttl pk-yrs)    Types: Cigarettes    Start date: 10/13/1965    Quit date: 10/14/2015    Years since quitting: 8.8   Smokeless tobacco: Former  Building Services Engineer status: Never Used  Substance and Sexual Activity   Alcohol use: Yes    Alcohol/week: 56.0 standard drinks of alcohol    Types: 56 Cans of beer per week    Comment: I sit around and drink beer, that's all I got to do   Drug use: No   Sexual activity: Not Currently  Other Topics Concern    Not on file  Social History Narrative   Not on file   Social Drivers of Health   Financial Resource Strain: Not on file  Food Insecurity: No Food Insecurity (07/13/2024)   Hunger Vital Sign    Worried About Running Out of Food in the Last Year: Never true    Ran Out of Food in the Last Year: Never true  Transportation Needs: No Transportation Needs (07/13/2024)   PRAPARE - Administrator, Civil Service (Medical): No    Lack of Transportation (Non-Medical): No  Physical Activity: Not on file  Stress: Not on file  Social Connections: Socially Isolated (07/13/2024)   Social Connection and Isolation Panel    Frequency of Communication with Friends and Family: Three times a week    Frequency of Social Gatherings with Friends and Family: Once a week    Attends Religious Services: Never    Active Member of [employer]  or Organizations: No    Attends Banker Meetings: Never    Marital Status: Divorced  Catering Manager Violence: Not At Risk (07/13/2024)   Humiliation, Afraid, Rape, and Kick questionnaire    Fear of Current or Ex-Partner: No    Emotionally Abused: No    Physically Abused: No    Sexually Abused: No    FAMILY HISTORY: Family History  Problem Relation Age of Onset   Heart disease Mother     ALLERGIES:  is allergic to amoxicillin  and tizanidine .  MEDICATIONS:  Current Outpatient Medications  Medication Sig Dispense Refill   acetaminophen  (TYLENOL ) 500 MG tablet Take 1,000 mg by mouth every 6 (six) hours as needed for moderate pain or headache.     albuterol  (PROVENTIL ) (2.5 MG/3ML) 0.083% nebulizer solution Take 2.5 mg by nebulization every 4 (four) hours.     albuterol  (VENTOLIN  HFA) 108 (90 Base) MCG/ACT inhaler Inhale 2 puffs into the lungs every 6 (six) hours as needed for wheezing or shortness of breath. 8 g 11   aspirin  EC 81 MG tablet Take 1 tablet (81 mg total) by mouth daily. 90 tablet 3   atorvastatin  (LIPITOR) 40 MG tablet Take 1 tablet (40  mg total) by mouth at bedtime. 90 tablet 3   CALCIUM  600/VITAMIN D  600-10 MG-MCG TABS Take 1 tablet by mouth 2 (two) times daily.     clopidogrel  (PLAVIX ) 75 MG tablet Take 1 tablet (75 mg total) by mouth daily. 90 tablet 3   Dupilumab  (DUPIXENT ) 300 MG/2ML SOAJ Inject 300 mg into the skin every 14 (fourteen) days. 12 mL 1   EPINEPHrine  0.3 mg/0.3 mL IJ SOAJ injection Inject 0.3 mg into the muscle as needed for anaphylaxis.     gabapentin  (NEURONTIN ) 300 MG capsule TAKE 1 CAPSULE BY MOUTH THREE TIMES A DAY 90 capsule 1   ipratropium-albuterol  (DUONEB) 0.5-2.5 (3) MG/3ML SOLN INHALE 1 VIAL THROUGH NEBULIZER EVERY 6 HOURS 270 mL 2   lidocaine -prilocaine  (EMLA ) cream Apply to affected area once 30 g 5   magnesium  oxide (MAG-OX) 400 (241.3 Mg) MG tablet Take 1 tablet (400 mg total) by mouth daily. 30 tablet 0   metoprolol  succinate (TOPROL -XL) 50 MG 24 hr tablet Take 50 mg by mouth daily.     montelukast  (SINGULAIR ) 10 MG tablet Take 10 mg by mouth daily.     Multiple Vitamin (MULTIVITAMIN WITH MINERALS) TABS tablet Take 1 tablet by mouth daily. 30 tablet 0   oxyCODONE  (OXY IR/ROXICODONE ) 5 MG immediate release tablet Take 1 tablet (5 mg total) by mouth every 8 (eight) hours as needed for severe pain (pain score 7-10). 90 tablet 0   pantoprazole  (PROTONIX ) 40 MG tablet Take 1 tablet (40 mg total) by mouth 2 (two) times daily. 90 tablet 3   potassium chloride  SA (KLOR-CON  M) 20 MEQ tablet Take 20 mEq by mouth daily.     [Paused] amLODipine  (NORVASC ) 5 MG tablet Take 1 tablet (5 mg total) by mouth at bedtime. Skip the dose if systolic BP less than 130 mmHg (Patient not taking: Reported on 08/21/2024) 30 tablet 5   azithromycin  (ZITHROMAX ) 500 MG tablet Take 500 mg by mouth daily. (Patient not taking: Reported on 08/21/2024)     ferrous sulfate  325 (65 FE) MG tablet Take 1 tablet (325 mg total) by mouth 2 (two) times daily with a meal. (Patient not taking: Reported on 08/21/2024) 60 tablet 2    fluticasone -salmeterol (ADVAIR HFA) 115-21 MCG/ACT inhaler Inhale 2 puffs into the lungs  2 (two) times daily. (Patient not taking: Reported on 08/21/2024) 12 g 12   furosemide  (LASIX ) 20 MG tablet Take 1 tablet (20 mg total) by mouth daily. (Patient not taking: Reported on 08/21/2024) 3 tablet 0   guaiFENesin -codeine  100-10 MG/5ML syrup TAKE 10 ML BY MOUTH 3 TIMES A DAY AS NEEDED FOR COUGH (Patient not taking: Reported on 08/21/2024) 120 mL 0   omega-3 acid ethyl esters (LOVAZA) 1 g capsule Take 1 g by mouth daily. (Patient not taking: Reported on 08/21/2024)     predniSONE  (DELTASONE ) 10 MG tablet TAKE 1 TABLET (10 MG TOTAL) BY MOUTH DAILY AS NEEDED. TAKE AS DIRECTED. (Patient not taking: Reported on 08/21/2024) 30 tablet 4   Spacer/Aero-Holding Chambers (AEROCHAMBER MV) inhaler Use as instructed (Patient not taking: Reported on 08/21/2024) 1 each 0   sucralfate  (CARAFATE ) 1 g tablet Take 1 tablet (1 g total) by mouth 3 (three) times daily before meals. Dissolve in 4tbs warm water , swish and swallow (Patient not taking: Reported on 08/21/2024) 90 tablet 4   No current facility-administered medications for this visit.   Facility-Administered Medications Ordered in Other Visits  Medication Dose Route Frequency Provider Last Rate Last Admin   heparin  lock flush 100 unit/mL  500 Units Intravenous Once Banyan Goodchild, MD        Review of Systems  Constitutional:  Positive for fatigue. Negative for appetite change, chills and fever.  HENT:   Negative for hearing loss and voice change.   Eyes:  Negative for eye problems and icterus.  Respiratory:  Positive for shortness of breath. Negative for chest tightness and cough.   Cardiovascular:  Negative for chest pain and leg swelling.  Gastrointestinal:  Negative for abdominal distention and abdominal pain.  Endocrine: Negative for hot flashes.  Genitourinary:  Negative for difficulty urinating, dysuria and frequency.   Musculoskeletal:  Negative for arthralgias.   Skin:  Negative for itching and rash.  Neurological:  Negative for light-headedness and numbness.  Hematological:  Negative for adenopathy. Does not bruise/bleed easily.  Psychiatric/Behavioral:  Negative for confusion.      PHYSICAL EXAMINATION: ECOG PERFORMANCE STATUS: 2 - Symptomatic, <50% confined to bed  Vitals:   08/21/24 1021  BP: 130/77  Pulse: 88  Resp: 18  Temp: (!) 96.9 F (36.1 C)  SpO2: 93%   Filed Weights   08/21/24 1021  Weight: 182 lb (82.6 kg)    Physical Exam Constitutional:      General: He is not in acute distress.    Appearance: He is not diaphoretic.  HENT:     Head: Normocephalic and atraumatic.     Mouth/Throat:     Pharynx: No oropharyngeal exudate.  Eyes:     General: No scleral icterus.    Pupils: Pupils are equal, round, and reactive to light.  Cardiovascular:     Rate and Rhythm: Normal rate and regular rhythm.  Pulmonary:     Effort: Pulmonary effort is normal. No respiratory distress.     Comments: Decreased breath sounds bilaterally.  R worse than L On nasal cannula oxygen  Abdominal:     General: Bowel sounds are normal. There is no distension.     Palpations: Abdomen is soft.  Musculoskeletal:        General: Normal range of motion.     Cervical back: Normal range of motion and neck supple.     Comments: Trace edema of lower extremities  bilaterally.    Skin:    General: Skin is warm and  dry.     Findings: No erythema.  Neurological:     Mental Status: He is alert and oriented to person, place, and time. Mental status is at baseline.     Motor: No abnormal muscle tone.  Psychiatric:        Mood and Affect: Affect normal.      LABORATORY DATA:  I have reviewed the data as listed    Latest Ref Rng & Units 08/21/2024    9:44 AM 07/16/2024    3:22 AM 07/15/2024    5:36 AM  CBC  WBC 4.0 - 10.5 K/uL 7.7  7.4  7.3   Hemoglobin 13.0 - 17.0 g/dL 89.3  89.4  89.7   Hematocrit 39.0 - 52.0 % 34.1  33.5  33.7   Platelets 150 -  400 K/uL 247  292  240       Latest Ref Rng & Units 08/21/2024    9:44 AM 07/16/2024    3:22 AM 07/15/2024    5:36 AM  CMP  Glucose 70 - 99 mg/dL 889  897  96   BUN 8 - 23 mg/dL 14  22  17    Creatinine 0.61 - 1.24 mg/dL 9.25  9.39  9.39   Sodium 135 - 145 mmol/L 134  136  141   Potassium 3.5 - 5.1 mmol/L 3.6  4.5  4.0   Chloride 98 - 111 mmol/L 100  95  99   CO2 22 - 32 mmol/L 26  33  35   Calcium  8.9 - 10.3 mg/dL 8.5  8.7  8.7   Total Protein 6.5 - 8.1 g/dL 6.9     Total Bilirubin 0.0 - 1.2 mg/dL 0.5     Alkaline Phos 38 - 126 U/L 83     AST 15 - 41 U/L 22     ALT 0 - 44 U/L 11        RADIOGRAPHIC STUDIES: I have personally reviewed the radiological images as listed and agreed with the findings in the report. No results found.

## 2024-08-21 NOTE — Assessment & Plan Note (Signed)
 Chemotherapy plan as listed above

## 2024-08-21 NOTE — Patient Instructions (Signed)

## 2024-08-23 ENCOUNTER — Ambulatory Visit
Admission: RE | Admit: 2024-08-23 | Discharge: 2024-08-23 | Disposition: A | Discharge status: Home / Self Care | Attending: Oncology | Admitting: Oncology

## 2024-08-23 ENCOUNTER — Ambulatory Visit
Admission: RE | Admit: 2024-08-23 | Discharge: 2024-08-23 | Disposition: A | Source: Ambulatory Visit | Attending: Oncology

## 2024-08-23 DIAGNOSIS — C3491 Malignant neoplasm of unspecified part of right bronchus or lung: Secondary | ICD-10-CM | POA: Insufficient documentation

## 2024-08-23 NOTE — Progress Notes (Signed)
 Reclast Infusion: Zoledronic Acid 5mg /178mL intravenous over 30 minutes.  Patient pre-medicated with Tylenol . Patient consumed extra water . Patient has had infusion before without complications. IV: 24G 3/4in.  Attempts: 1 Tolerated well. Blood return noted. Flushed without pain or signs of infiltration.  Patient's vital signs stable throughout infusion. IV site clean, dry, and intact. No signs of infiltration.  Patients IV removed. Catheter intact. Site is clean and dry. Patient stable to discharge. Will continue to hydrate at home. Will call the office if any problems.

## 2024-08-24 ENCOUNTER — Telehealth: Payer: Self-pay

## 2024-08-24 DIAGNOSIS — J449 Chronic obstructive pulmonary disease, unspecified: Secondary | ICD-10-CM

## 2024-08-24 DIAGNOSIS — J4550 Severe persistent asthma, uncomplicated: Secondary | ICD-10-CM

## 2024-08-24 NOTE — Telephone Encounter (Signed)
 Copied from CRM [id number]. Topic: Clinical - Prescription Issue >> Aug 24, 2024 10:37 AM Benton KIDD wrote: Reason for CRM: patient is saying he needs a dupixent  shot for December . Trying to get a hold of somebody through dupixent  is not working . Patient is requesting dr tamea to get in touch with the dupixent  people to get everything on the roll for December [phone]

## 2024-08-25 ENCOUNTER — Telehealth: Payer: Self-pay | Admitting: *Deleted

## 2024-08-25 MED ORDER — DUPIXENT 300 MG/2ML ~~LOC~~ SOAJ: 2.0000 mL | SUBCUTANEOUS | 1 refills | Status: DC

## 2024-08-25 NOTE — Telephone Encounter (Signed)
 Pt called in to get results from chest xray. Results reviewed with patient.

## 2024-08-25 NOTE — Telephone Encounter (Signed)
 Rx for Dupixent  sent to [facility] since the pharmacy will be changing from [facility].   ATC patient to alert him, but went straight to VM. Left HIPAA compliant VM and provided my office line as a call back number in case he has questions.

## 2024-09-04 ENCOUNTER — Encounter: Payer: Self-pay | Admitting: Oncology

## 2024-09-04 ENCOUNTER — Inpatient Hospital Stay

## 2024-09-04 ENCOUNTER — Ambulatory Visit: Payer: Self-pay | Admitting: Oncology

## 2024-09-04 ENCOUNTER — Ambulatory Visit
Admission: RE | Admit: 2024-09-04 | Discharge: 2024-09-04 | Disposition: A | Discharge status: Home / Self Care | Source: Ambulatory Visit | Attending: Radiation Oncology | Admitting: Radiation Oncology

## 2024-09-04 ENCOUNTER — Encounter: Payer: Self-pay | Admitting: Radiation Oncology

## 2024-09-04 ENCOUNTER — Inpatient Hospital Stay: Admitting: Oncology

## 2024-09-04 VITALS — BP 100/58 | HR 93

## 2024-09-04 VITALS — BP 152/84 | Wt 184.0 lb

## 2024-09-04 VITALS — BP 145/87 | HR 86 | Temp 98.3°F | Resp 12

## 2024-09-04 DIAGNOSIS — Z5111 Encounter for antineoplastic chemotherapy: Secondary | ICD-10-CM | POA: Diagnosis not present

## 2024-09-04 DIAGNOSIS — C3432 Malignant neoplasm of lower lobe, left bronchus or lung: Secondary | ICD-10-CM | POA: Diagnosis present

## 2024-09-04 DIAGNOSIS — C3491 Malignant neoplasm of unspecified part of right bronchus or lung: Secondary | ICD-10-CM | POA: Diagnosis not present

## 2024-09-04 DIAGNOSIS — J4489 Other specified chronic obstructive pulmonary disease: Secondary | ICD-10-CM

## 2024-09-04 DIAGNOSIS — D381 Neoplasm of uncertain behavior of trachea, bronchus and lung: Secondary | ICD-10-CM

## 2024-09-04 DIAGNOSIS — J90 Pleural effusion, not elsewhere classified: Secondary | ICD-10-CM

## 2024-09-04 DIAGNOSIS — Z923 Personal history of irradiation: Secondary | ICD-10-CM | POA: Diagnosis not present

## 2024-09-04 LAB — CBC WITH DIFFERENTIAL (CANCER CENTER ONLY)
Abs Immature Granulocytes: 0.02 K/uL (ref 0.00–0.07)
Basophils Absolute: 0 K/uL (ref 0.0–0.1)
Basophils Relative: 0 %
Eosinophils Absolute: 0.1 K/uL (ref 0.0–0.5)
Eosinophils Relative: 1 %
HCT: 33.1 % — ABNORMAL LOW (ref 39.0–52.0)
Hemoglobin: 10.1 g/dL — ABNORMAL LOW (ref 13.0–17.0)
Immature Granulocytes: 0 %
Lymphocytes Relative: 4 %
Lymphs Abs: 0.3 K/uL — ABNORMAL LOW (ref 0.7–4.0)
MCH: 28.4 pg (ref 26.0–34.0)
MCHC: 30.5 g/dL (ref 30.0–36.0)
MCV: 93 fL (ref 80.0–100.0)
Monocytes Absolute: 0.7 K/uL (ref 0.1–1.0)
Monocytes Relative: 11 %
Neutro Abs: 5.1 K/uL (ref 1.7–7.7)
Neutrophils Relative %: 84 %
Platelet Count: 240 K/uL (ref 150–400)
RBC: 3.56 MIL/uL — ABNORMAL LOW (ref 4.22–5.81)
RDW: 17.3 % — ABNORMAL HIGH (ref 11.5–15.5)
WBC Count: 6.2 K/uL (ref 4.0–10.5)
nRBC: 0 % (ref 0.0–0.2)

## 2024-09-04 LAB — CMP (CANCER CENTER ONLY)
ALT: 15 U/L (ref 0–44)
AST: 22 U/L (ref 15–41)
Albumin: 3.5 g/dL (ref 3.5–5.0)
Alkaline Phosphatase: 112 U/L (ref 38–126)
Anion gap: 9 (ref 5–15)
BUN: 19 mg/dL (ref 8–23)
CO2: 27 mmol/L (ref 22–32)
Calcium: 7.9 mg/dL — ABNORMAL LOW (ref 8.9–10.3)
Chloride: 101 mmol/L (ref 98–111)
Creatinine: 1.3 mg/dL — ABNORMAL HIGH (ref 0.61–1.24)
GFR, Estimated: 60 mL/min — ABNORMAL LOW (ref >60)
Glucose, Bld: 96 mg/dL (ref 70–99)
Potassium: 4.2 mmol/L (ref 3.5–5.1)
Sodium: 137 mmol/L (ref 135–145)
Total Bilirubin: 0.4 mg/dL (ref 0.0–1.2)
Total Protein: 6.7 g/dL (ref 6.5–8.1)

## 2024-09-04 MED ORDER — PROCHLORPERAZINE MALEATE 10 MG PO TABS
10.0000 mg | ORAL_TABLET | Freq: Once | ORAL | Status: AC
  Administered 2024-09-04: 10 mg via ORAL
  Filled 2024-09-04: qty 1

## 2024-09-04 MED ORDER — SODIUM CHLORIDE 0.9 % IV SOLN
1000.0000 mg/m2 | Freq: Once | INTRAVENOUS | Status: AC
  Administered 2024-09-04: 1976 mg via INTRAVENOUS
  Filled 2024-09-04: qty 51.97

## 2024-09-04 MED ORDER — SODIUM CHLORIDE 0.9 % IV SOLN
INTRAVENOUS | Status: DC
  Filled 2024-09-04: qty 250

## 2024-09-04 NOTE — Assessment & Plan Note (Signed)
 Increased pleural effusion, previously discussed with pulm, right side effusion is loculated.  S/p thoracentesis by IR. Cytology is negative.  Pleural fluid LDH/serum LDH ratio > 0.6 - exudate.  Repeat Xray showed small right pleural effusion. monitor.

## 2024-09-04 NOTE — Assessment & Plan Note (Signed)
 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  s/p dose reduced carboplatin , Taxol  and Keytruda  on 06/01/2023 x 4 cycles, poor PS/hospitalization--> Keytruda  maintenance until Feb 2025 --> PET progression--> 01/05/2024 concurrent chemo RT --> May 2025 CT showed slight decrease of lung mass--> June 2025 Gemcitabine .  Currently on Gemcitabine   as systemic maintenance.   Repeat CT scan showed mixed response. Pleural effusion R>L, decreased right side lung mass, increased left lower lobe mass. -Status post left lower lobe palliative radiation  Labs are reviewed and discussed with patient. Proceed with Gemcitabine .

## 2024-09-04 NOTE — Patient Instructions (Signed)
 [facility] - A DEPT OF [facility]  Discharge Instructions: Thank you for choosing [facility] to provide your oncology and hematology care.  If you have a lab appointment with the Cancer Center, please go directly to the Cancer Center and check in at the registration area.  Wear comfortable clothing and clothing appropriate for easy access to any Portacath or PICC line.   We strive to give you quality time with your provider. You may need to reschedule your appointment if you arrive late (15 or more minutes).  Arriving late affects you and other patients whose appointments are after yours.  Also, if you miss three or more appointments without notifying the office, you may be dismissed from the clinic at the provider's discretion.      For prescription refill requests, have your pharmacy contact our office and allow 72 hours for refills to be completed.    Today you received the following chemotherapy and/or immunotherapy agents- gemzar      To help prevent nausea and vomiting after your treatment, we encourage you to take your nausea medication as directed.  BELOW ARE SYMPTOMS THAT SHOULD BE REPORTED IMMEDIATELY: *FEVER GREATER THAN 100.4 F (38 C) OR HIGHER *CHILLS OR SWEATING *NAUSEA AND VOMITING THAT IS NOT CONTROLLED WITH YOUR NAUSEA MEDICATION *UNUSUAL SHORTNESS OF BREATH *UNUSUAL BRUISING OR BLEEDING *URINARY PROBLEMS (pain or burning when urinating, or frequent urination) *BOWEL PROBLEMS (unusual diarrhea, constipation, pain near the anus) TENDERNESS IN MOUTH AND THROAT WITH OR WITHOUT PRESENCE OF ULCERS (sore throat, sores in mouth, or a toothache) UNUSUAL RASH, SWELLING OR PAIN  UNUSUAL VAGINAL DISCHARGE OR ITCHING   Items with * indicate a potential emergency and should be followed up as soon as possible or go to the Emergency Department if any problems should occur.  Please show the CHEMOTHERAPY ALERT CARD or IMMUNOTHERAPY  ALERT CARD at check-in to the Emergency Department and triage nurse.  Should you have questions after your visit or need to cancel or reschedule your appointment, please contact [facility] - A DEPT OF [facility]  [phone] and follow the prompts.  Office hours are 8:00 a.m. to 4:30 p.m. Monday - Friday. Please note that voicemails left after 4:00 p.m. may not be returned until the following business day.  We are closed weekends and major holidays. You have access to a nurse at all times for urgent questions. Please call the main number to the clinic [phone] and follow the prompts.  For any non-urgent questions, you may also contact your provider using MyChart. We now offer e-Visits for anyone 59 and older to request care online for non-urgent symptoms. For details visit [email or web address].   Also download the MyChart app! Go to the app store, search "MyChart", open the app, select [place], and log in with your MyChart username and password.

## 2024-09-04 NOTE — Assessment & Plan Note (Addendum)
 Continue calcium  1200mg  and Vitamin D  supplementation.  Calcium  has further decreased. Will arrange patient to get IV calcium  gluconate

## 2024-09-04 NOTE — Progress Notes (Signed)
 Radiation Oncology Follow up Note  Name: Austin Patterson   Date:   09/04/2024 MRN:  [id number] DOB: September 27, 1956    This 68 y.o. male presents to the clinic today for 1 month follow-up status post.  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  REFERRING PROVIDER: Autry Grayce LABOR, PA  HPI: Patient is a 68 year old male now out 1 month having completed palliative radiation therapy to his left lower lobe short course of treatment.SABRA  He is seen today in routine follow-up states he is doing fairly well no specific dysphagia cough hemoptysis or chest tightness.  He is currently on gemcitabine  as systemic maintenance.  Recent chest x-ray which I reviewed shows a small right pleural effusion with associated right basilar atelectasis or infiltrate.  COMPLICATIONS OF TREATMENT: none  FOLLOW UP COMPLIANCE: keeps appointments   PHYSICAL EXAM:  BP (!) 145/87   Pulse 86   Temp 98.3 F (36.8 C) (Tympanic)   Resp 12  Fragile appearing wheelchair-bound male in NAD on nasal oxygen  and.  Well-developed well-nourished patient in NAD. HEENT reveals PERLA, EOMI, discs not visualized.  Oral cavity is clear. No oral mucosal lesions are identified. Neck is clear without evidence of cervical or supraclavicular adenopathy. Lungs are clear to A&P. Cardiac examination is essentially unremarkable with regular rate and rhythm without murmur rub or thrill. Abdomen is benign with no organomegaly or masses noted. Motor sensory and DTR levels are equal and symmetric in the upper and lower extremities. Cranial nerves II through XII are grossly intact. Proprioception is intact. No peripheral adenopathy or edema is identified. No motor or sensory levels are noted. Crude visual fields are within normal range.  RADIOLOGY RESULTS: Chest x-ray reviewed.  PLAN: At this time I will turn follow-up care over to medical oncology  continues on gemcitabine  treatment.  I be happy to reevaluate this patient in time the future for further palliative treatment.  Patient knows to call with any concerns.  I would like to take this opportunity to thank you for allowing me to participate in the care of your patient.[name], MD

## 2024-09-04 NOTE — Assessment & Plan Note (Signed)
 Chronic respiratory failure on Oxygen .  Continue inhalers.  Follow up with Dr. Tamea

## 2024-09-04 NOTE — Assessment & Plan Note (Signed)
 Chemotherapy plan as listed above

## 2024-09-04 NOTE — Progress Notes (Signed)
 Hematology/Oncology Progress note [phone] [phone]        CHIEF COMPLAINTS/PURPOSE OF CONSULTATION:  Stage IV lung adenocarcinoma.   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  s/p dose reduced carboplatin , Taxol  and Keytruda  on 06/01/2023 x 4 cycles, poor PS/hospitalization--> Keytruda  maintenance until Feb 2025 --> PET progression--> 01/05/2024 concurrent chemo RT --> May 2025 CT showed slight decrease of lung mass--> June 2025 Gemcitabine .  Currently on Gemcitabine   as systemic maintenance.   Repeat CT scan showed mixed response. Pleural effusion R>L, decreased right side lung mass, increased left lower lobe mass. -Status post left lower lobe palliative radiation  Labs are reviewed and discussed with patient. Proceed with Gemcitabine .     Asthma-COPD overlap syndrome (HCC) Chronic respiratory failure on Oxygen .  Continue inhalers.  Follow up with Dr. Tamea  Encounter for antineoplastic chemotherapy Chemotherapy plan as listed above.   Hypocalcemia Continue calcium  1200mg  and Vitamin D  supplementation.  Calcium  has further decreased. Will arrange patient to get IV calcium  gluconate   Pleural effusion Increased pleural effusion, previously discussed with pulm, right side effusion is loculated.  S/p thoracentesis by IR. Cytology is negative.  Pleural fluid LDH/serum LDH ratio > 0.6 - exudate.  Repeat Xray showed small right pleural effusion. monitor.   Orders Placed This Encounter  Procedures   DG Chest 2 View    Standing Status:   Future    Expected Date:   09/18/2024    Expiration Date:   09/04/2025    Reason for Exam (SYMPTOM  OR DIAGNOSIS REQUIRED):   pleural effusion follow up     Preferred imaging location?:   [facility]   Follow up in 2 weeks  All questions were answered. The patient knows to call the clinic with any problems, questions or concerns.  Zelphia Cap, MD, PhD [facility] Hematology Oncology 09/04/2024    HISTORY OF PRESENTING ILLNESS:  Austin Patterson 68 y.o. male presents to establish care for Stage IV lung adenocarcinoma.  Patient previously followed up with Dr. Clista who left practice. He establish care with on 03/02/2024  I have reviewed his chart and materials related to his cancer extensively and collaborated history with the patient. Summary of oncologic history is as follows: Oncology History  Stage IV adenocarcinoma of lung (HCC)  04/19/2023 Imaging   PET scan showed 1. 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 2. Right lower lobe hypermetabolic pulmonary nodule, favoring synchronous primary over pulmonary metastasis. 3. Incidental findings, including: Aortic atherosclerosis (ICD10-I70.0), coronary artery atherosclerosis and emphysema (ICD10-J43.9). Prostatomegaly. Sinus disease.   05/18/2023 Initial Diagnosis   Sarcomatoid carcinoma of lung (HCC)   05/20/2023 Cancer Staging   Staging form: Lung, AJCC 8th Edition - Clinical: Stage IV (cT4, cN3, cM1) - Signed by Agrawal, Kavita, MD on 05/20/2023 Stage prefix: Initial diagnosis   06/01/2023 - 11/08/2023 Chemotherapy   Patient is on Treatment Plan : LUNG NSCLC Carboplatin  (6) + Paclitaxel  (200) + Pembrolizumab  (200) D1 q21d x 4 cycles / Pembrolizumab  (200) Maintenance D1 q21d     08/30/2023 Imaging   CT chest with contrast improving right middle lobe mass, corresponding to the patient's known primary bronchogenic carcinoma. Small mediastinal and right perihilar nodal  metastases are also improving.   Associated bilateral lower lobe pulmonary metastases,  overall improved, although one right lower lobe nodular opacity is mildly progressive. Attention on follow-up is suggested.   Stable bilateral adrenal adenomas.   Aortic Atherosclerosis (ICD10-I70.0) and Emphysema (ICD10-J43.9).   11/26/2023 Imaging   CT chest abdomen pelvis with contrast 1. Interval progression of disease. The dominant mass within the right middle lobe has increased in size in the interval. There is also been significant increase in size of left lower lobe metastasis. Stable to mild increase in size of metastasis to the posterior right lower lobe. 2. Stable appearance of mediastinal and right hilar lymph nodes. 3. No signs of metastatic disease within the abdomen or pelvis. 4. Aortic Atherosclerosis (ICD10-I70.0) and Emphysema (ICD10-J43.9).   12/14/2023 Imaging   PET scan showed 1. Disease progression as evidenced by hypermetabolic nodules and masses in the lungs bilaterally as well as hypermetabolic metastatic mediastinal, hilar and periportal lymph nodes. 2. Bilateral adrenal adenomas. 3. Gastric wall thickening. 4. Aortic atherosclerosis (ICD10-I70.0). Coronary artery calcification. 5.  Emphysema (ICD10-J43.9)   01/06/2024 - 03/02/2024 Chemotherapy   Patient is on Treatment Plan : LUNG Carboplatin  + Paclitaxel  + XRT q7d     03/17/2024 Imaging   CT chest abdomen pelvis w contrast showed Overall slightly improved from previous examination. The large lung masses involving the middle lobe and left lower lobe appears slightly smaller today. Lymph nodes in the mediastinum, right hilum and upper abdomen are also slightly smaller. No new mass lesion, lymph node enlargement identified. Extensive vascular calcifications with some areas of stenosis particularly the common femoral arteries bilaterally. Please correlate with symptoms. Fatty liver infiltration.   Advanced emphysematous lung changes.   One new small right-sided lung 4 mm nodule identified. Recommend  attention on follow-up.     03/23/2024 -  Chemotherapy   Patient is on Treatment Plan : LUNG Gemcitabine  (1000) D1,8 q21d     06/12/2024 Imaging   CT chest abdomen pelvis w contrast showed   1. Interval decreased size of right middle lobe mass and increased size of left lower lobe mass. 2. New pulmonary edema and large right and trace left pleural effusions with new subpleural rounded consolidations in the right middle and lower lobes, which may represent atelectasis, attention on follow-up. 3. Unchanged 12 mm periportal lymph node. 4. Small paraumbilical hernia contains a loop of nonobstructed small bowel. 5. Aortic Atherosclerosis (ICD10-I70.0). Coronary artery calcifications. Assessment for potential risk factor modification, dietary therapy or pharmacologic therapy may be warranted, if clinically indicated.   07/13/2024 - 07/18/2024 Hospital Admission   Patient was hospitalized due to acute hypoxic respiratory failure, pleural effusion.  Patient had therapeutic thoracentesis done during the admission.  Patient's oxygen  requirement was 5 L initially and improved to 3 L    - 08/07/2024 Radiation Therapy   Patient received palliative radiation to left lower lung    He reports feeling ok today.  + chronic SOB to his baseline level. Chronic respiratory failure on nasal cannula oxygen .   No nausea vomiting diarrhea.  He cuts back on alcohol use.   MEDICAL HISTORY:  Past Medical History:  Diagnosis Date   Abdominal pain 12/07/2022   Acute urinary retention 12/07/2022   Cancer (HCC)    Chest pain 01/24/2022   COPD (chronic obstructive pulmonary disease) (HCC)    GERD (gastroesophageal reflux disease)    Hemothorax on right 05/11/2023   Hyperlipidemia    Hypertension    Pleuritic pain 05/10/2023   Pneumothorax  after biopsy 05/11/2023   Post procedure discomfort 05/13/2023   Pulmonary fibrosis (HCC) 11/2015    SURGICAL HISTORY: Past Surgical History:  Procedure  Laterality Date   COLONOSCOPY     CORONARY STENT PLACEMENT     ESOPHAGOGASTRODUODENOSCOPY (EGD) WITH PROPOFOL  N/A 09/23/2016   Procedure: ESOPHAGOGASTRODUODENOSCOPY (EGD) WITH PROPOFOL ;  Surgeon: Ruel Kung, MD;  Location: [facility] END[facility]COPY;  Service: Endoscopy;  Laterality: N/A;   IR IMAGING GUIDED PORT INSERTION  05/26/2023   IR IMAGING GUIDED PORT INSERTION  07/30/2023   IR RADIOLOGIST EVAL & MGMT  07/20/2023   KYPHOPLASTY N/A 03/14/2020   Procedure: T7 & T11 KYPHOPLASTY;  Surgeon: Kathlynn Sharper, MD;  Location: [facility] ORS;  Service: Orthopedics;  Laterality: N/A;   PORT-A-CATH REMOVAL N/A 06/14/2023   Procedure: REMOVAL PORT-A-CATH;  Surgeon: Tye Millet, DO;  Location: [facility] ORS;  Service: General;  Laterality: N/A;   SHOULDER ACROMIOPLASTY      SOCIAL HISTORY: Social History   Socioeconomic History   Marital status: Divorced    Spouse name: Not on file   Number of children: Not on file   Years of education: Not on file   Highest education level: Not on file  Occupational History   Not on file  Tobacco Use   Smoking status: Former    Current packs/day: 0.00    Average packs/day: 2.0 packs/day for 50.0 years (100.0 ttl pk-yrs)    Types: Cigarettes    Start date: 10/13/1965    Quit date: 10/14/2015    Years since quitting: 8.8   Smokeless tobacco: Former  Building Services Engineer status: Never Used  Substance and Sexual Activity   Alcohol use: Yes    Alcohol/week: 56.0 standard drinks of alcohol    Types: 56 Cans of beer per week    Comment: I sit around and drink beer, that's all I got to do   Drug use: No   Sexual activity: Not Currently  Other Topics Concern   Not on file  Social History Narrative   Not on file   Social Drivers of Health   Financial Resource Strain: Not on file  Food Insecurity: No Food Insecurity (07/13/2024)   Hunger Vital Sign    Worried About Running Out of Food in the Last Year: Never true    Ran Out of Food in the Last Year: Never true   Transportation Needs: No Transportation Needs (07/13/2024)   PRAPARE - Administrator, Civil Service (Medical): No    Lack of Transportation (Non-Medical): No  Physical Activity: Not on file  Stress: Not on file  Social Connections: Socially Isolated (07/13/2024)   Social Connection and Isolation Panel    Frequency of Communication with Friends and Family: Three times a week    Frequency of Social Gatherings with Friends and Family: Once a week    Attends Religious Services: Never    Database Administrator or Organizations: No    Attends Banker Meetings: Never    Marital Status: Divorced  Catering Manager Violence: Not At Risk (07/13/2024)   Humiliation, Afraid, Rape, and Kick questionnaire    Fear of Current or Ex-Partner: No    Emotionally Abused: No    Physically Abused: No    Sexually Abused: No    FAMILY HISTORY: Family History  Problem Relation Age of Onset   Heart disease Mother     ALLERGIES:  is allergic to amoxicillin  and tizanidine .  MEDICATIONS:  Current Outpatient Medications  Medication Sig  Dispense Refill   acetaminophen  (TYLENOL ) 500 MG tablet Take 1,000 mg by mouth every 6 (six) hours as needed for moderate pain or headache.     albuterol  (PROVENTIL ) (2.5 MG/3ML) 0.083% nebulizer solution Take 2.5 mg by nebulization every 4 (four) hours.     albuterol  (VENTOLIN  HFA) 108 (90 Base) MCG/ACT inhaler Inhale 2 puffs into the lungs every 6 (six) hours as needed for wheezing or shortness of breath. 8 g 11   [Paused] amLODipine  (NORVASC ) 5 MG tablet Take 1 tablet (5 mg total) by mouth at bedtime. Skip the dose if systolic BP less than 130 mmHg 30 tablet 5   aspirin  EC 81 MG tablet Take 1 tablet (81 mg total) by mouth daily. 90 tablet 3   atorvastatin  (LIPITOR) 40 MG tablet Take 1 tablet (40 mg total) by mouth at bedtime. 90 tablet 3   azithromycin  (ZITHROMAX ) 500 MG tablet Take 500 mg by mouth daily. (Patient not taking: Reported on 09/04/2024)      CALCIUM  600/VITAMIN D  600-10 MG-MCG TABS Take 1 tablet by mouth 2 (two) times daily.     clopidogrel  (PLAVIX ) 75 MG tablet Take 1 tablet (75 mg total) by mouth daily. 90 tablet 3   Dupilumab  (DUPIXENT ) 300 MG/2ML SOAJ Inject 300 mg into the skin every 14 (fourteen) days. 12 mL 1   EPINEPHrine  0.3 mg/0.3 mL IJ SOAJ injection Inject 0.3 mg into the muscle as needed for anaphylaxis.     ferrous sulfate  325 (65 FE) MG tablet Take 1 tablet (325 mg total) by mouth 2 (two) times daily with a meal. (Patient not taking: Reported on 09/04/2024) 60 tablet 2   fluticasone -salmeterol (ADVAIR HFA) 115-21 MCG/ACT inhaler Inhale 2 puffs into the lungs 2 (two) times daily. (Patient not taking: Reported on 09/04/2024) 12 g 12   furosemide  (LASIX ) 20 MG tablet Take 1 tablet (20 mg total) by mouth daily. (Patient not taking: Reported on 09/04/2024) 3 tablet 0   gabapentin  (NEURONTIN ) 300 MG capsule TAKE 1 CAPSULE BY MOUTH THREE TIMES A DAY 90 capsule 1   guaiFENesin -codeine  100-10 MG/5ML syrup TAKE 10 ML BY MOUTH 3 TIMES A DAY AS NEEDED FOR COUGH (Patient not taking: Reported on 09/04/2024) 120 mL 0   ipratropium-albuterol  (DUONEB) 0.5-2.5 (3) MG/3ML SOLN INHALE 1 VIAL THROUGH NEBULIZER EVERY 6 HOURS 270 mL 2   lidocaine -prilocaine  (EMLA ) cream Apply to affected area once 30 g 5   magnesium  oxide (MAG-OX) 400 (241.3 Mg) MG tablet Take 1 tablet (400 mg total) by mouth daily. 30 tablet 0   metoprolol  succinate (TOPROL -XL) 50 MG 24 hr tablet Take 50 mg by mouth daily.     montelukast  (SINGULAIR ) 10 MG tablet Take 10 mg by mouth daily.     Multiple Vitamin (MULTIVITAMIN WITH MINERALS) TABS tablet Take 1 tablet by mouth daily. 30 tablet 0   omega-3 acid ethyl esters (LOVAZA) 1 g capsule Take 1 g by mouth daily. (Patient not taking: Reported on 09/04/2024)     oxyCODONE  (OXY IR/ROXICODONE ) 5 MG immediate release tablet Take 1 tablet (5 mg total) by mouth every 8 (eight) hours as needed for severe pain (pain score 7-10). 90  tablet 0   pantoprazole  (PROTONIX ) 40 MG tablet Take 1 tablet (40 mg total) by mouth 2 (two) times daily. 90 tablet 3   potassium chloride  SA (KLOR-CON  M) 20 MEQ tablet Take 20 mEq by mouth daily.     predniSONE  (DELTASONE ) 10 MG tablet TAKE 1 TABLET (10 MG TOTAL) BY MOUTH DAILY  AS NEEDED. TAKE AS DIRECTED. (Patient not taking: Reported on 09/04/2024) 30 tablet 4   Spacer/Aero-Holding Chambers (AEROCHAMBER MV) inhaler Use as instructed (Patient not taking: Reported on 09/04/2024) 1 each 0   sucralfate  (CARAFATE ) 1 g tablet Take 1 tablet (1 g total) by mouth 3 (three) times daily before meals. Dissolve in 4tbs warm water , swish and swallow (Patient not taking: Reported on 09/04/2024) 90 tablet 4   No current facility-administered medications for this visit.   Facility-Administered Medications Ordered in Other Visits  Medication Dose Route Frequency Provider Last Rate Last Admin   heparin  lock flush 100 unit/mL  500 Units Intravenous Once Mohmed Farver, MD        Review of Systems  Constitutional:  Positive for fatigue. Negative for appetite change, chills and fever.  HENT:   Negative for hearing loss and voice change.   Eyes:  Negative for eye problems and icterus.  Respiratory:  Positive for shortness of breath. Negative for chest tightness and cough.   Cardiovascular:  Negative for chest pain and leg swelling.  Gastrointestinal:  Negative for abdominal distention and abdominal pain.  Endocrine: Negative for hot flashes.  Genitourinary:  Negative for difficulty urinating, dysuria and frequency.   Musculoskeletal:  Negative for arthralgias.  Skin:  Negative for itching and rash.  Neurological:  Negative for light-headedness and numbness.  Hematological:  Negative for adenopathy. Does not bruise/bleed easily.  Psychiatric/Behavioral:  Negative for confusion.      PHYSICAL EXAMINATION: ECOG PERFORMANCE STATUS: 2 - Symptomatic, <50% confined to bed  Vitals:   09/04/24 1152  BP: (!) 152/84    Filed Weights   09/04/24 1152  Weight: 184 lb (83.5 kg)    Physical Exam Constitutional:      General: He is not in acute distress.    Appearance: He is not diaphoretic.  HENT:     Head: Normocephalic and atraumatic.     Mouth/Throat:     Pharynx: No oropharyngeal exudate.  Eyes:     General: No scleral icterus.    Pupils: Pupils are equal, round, and reactive to light.  Cardiovascular:     Rate and Rhythm: Normal rate and regular rhythm.  Pulmonary:     Effort: Pulmonary effort is normal. No respiratory distress.     Comments: Decreased breath sounds bilaterally.  R worse than L On nasal cannula oxygen  Abdominal:     General: Bowel sounds are normal. There is no distension.     Palpations: Abdomen is soft.  Musculoskeletal:        General: Normal range of motion.     Cervical back: Normal range of motion and neck supple.     Comments: Trace edema of lower extremities  bilaterally.    Skin:    General: Skin is warm and dry.     Findings: No erythema.  Neurological:     Mental Status: He is alert and oriented to person, place, and time. Mental status is at baseline.     Motor: No abnormal muscle tone.  Psychiatric:        Mood and Affect: Affect normal.      LABORATORY DATA:  I have reviewed the data as listed    Latest Ref Rng & Units 09/04/2024   11:41 AM 08/21/2024    9:44 AM 07/16/2024    3:22 AM  CBC  WBC 4.0 - 10.5 K/uL 6.2  7.7  7.4   Hemoglobin 13.0 - 17.0 g/dL 89.8  89.3  89.4  Hematocrit 39.0 - 52.0 % 33.1  34.1  33.5   Platelets 150 - 400 K/uL 240  247  292       Latest Ref Rng & Units 09/04/2024   11:41 AM 08/21/2024    9:44 AM 07/16/2024    3:22 AM  CMP  Glucose 70 - 99 mg/dL 96  889  897   BUN 8 - 23 mg/dL 19  14  22    Creatinine 0.61 - 1.24 mg/dL 8.69  9.25  9.39   Sodium 135 - 145 mmol/L 137  134  136   Potassium 3.5 - 5.1 mmol/L 4.2  3.6  4.5   Chloride 98 - 111 mmol/L 101  100  95   CO2 22 - 32 mmol/L 27  26  33   Calcium  8.9 - 10.3  mg/dL 7.9  8.5  8.7   Total Protein 6.5 - 8.1 g/dL 6.7  6.9    Total Bilirubin 0.0 - 1.2 mg/dL 0.4  0.5    Alkaline Phos 38 - 126 U/L 112  83    AST 15 - 41 U/L 22  22    ALT 0 - 44 U/L 15  11       RADIOGRAPHIC STUDIES: I have personally reviewed the radiological images as listed and agreed with the findings in the report. DG Chest 2 View Result Date: 08/25/2024 EXAM: 2 VIEW(S) XRAY OF THE CHEST 08/23/2024 12:43:00 PM COMPARISON: 07/14/2024 CLINICAL HISTORY: lung cancer, pleural effusion follow up FINDINGS: LINES, TUBES AND DEVICES: Left internal jugular port-a-cath is unchanged. LUNGS AND PLEURA: Right pleural effusion is again noted with associated right basilar atelectasis or infiltrate. No pulmonary edema. No pneumothorax. HEART AND MEDIASTINUM: No acute abnormality of the cardiac and mediastinal silhouettes. BONES AND SOFT TISSUES: No acute osseous abnormality. IMPRESSION: 1. Small right pleural effusion with associated right basilar atelectasis or infiltrate. Electronically signed by: Lynwood Seip MD 08/25/2024 01:12 PM EST RP Workstation: [id number]

## 2024-09-05 NOTE — Telephone Encounter (Signed)
-----   Message from Zelphia Cap sent at 09/04/2024  8:38 PM EST ----- His calcium  level is significantly lower than previous.  Please ask if he is taking calcium  vitamin D  supplementation.  I recommend patient to get calcium  gluconate 2 g x 1.  Please arrange. ----- Message ----- From: Rebecka, [facility] [place] Sent: 09/04/2024  11:57 AM EST To: Zelphia Cap, MD

## 2024-09-05 NOTE — Telephone Encounter (Signed)
 Spoke to pt and he states that he ran out of calcium . He cannot be here today for IV Calcium  gluconate. He is scheduled tomorrow at 11:30am.

## 2024-09-06 ENCOUNTER — Other Ambulatory Visit: Payer: Self-pay

## 2024-09-06 ENCOUNTER — Inpatient Hospital Stay

## 2024-09-06 ENCOUNTER — Emergency Department

## 2024-09-06 ENCOUNTER — Inpatient Hospital Stay
Admission: EM | Admit: 2024-09-06 | Discharge: 2024-09-12 | DRG: 193 | Disposition: A | Discharge status: Skilled Nursing Facility | Attending: Internal Medicine | Admitting: Internal Medicine

## 2024-09-06 ENCOUNTER — Encounter: Payer: Self-pay | Admitting: Internal Medicine

## 2024-09-06 DIAGNOSIS — Z5111 Encounter for antineoplastic chemotherapy: Secondary | ICD-10-CM | POA: Diagnosis not present

## 2024-09-06 DIAGNOSIS — K5900 Constipation, unspecified: Secondary | ICD-10-CM | POA: Diagnosis not present

## 2024-09-06 DIAGNOSIS — C3490 Malignant neoplasm of unspecified part of unspecified bronchus or lung: Secondary | ICD-10-CM | POA: Diagnosis present

## 2024-09-06 DIAGNOSIS — J9621 Acute and chronic respiratory failure with hypoxia: Secondary | ICD-10-CM | POA: Diagnosis not present

## 2024-09-06 DIAGNOSIS — Z7982 Long term (current) use of aspirin: Secondary | ICD-10-CM

## 2024-09-06 DIAGNOSIS — Z1152 Encounter for screening for COVID-19: Secondary | ICD-10-CM

## 2024-09-06 DIAGNOSIS — Z888 Allergy status to other drugs, medicaments and biological substances status: Secondary | ICD-10-CM

## 2024-09-06 DIAGNOSIS — M25511 Pain in right shoulder: Secondary | ICD-10-CM | POA: Diagnosis present

## 2024-09-06 DIAGNOSIS — Z604 Social exclusion and rejection: Secondary | ICD-10-CM | POA: Diagnosis present

## 2024-09-06 DIAGNOSIS — A419 Sepsis, unspecified organism: Principal | ICD-10-CM

## 2024-09-06 DIAGNOSIS — Z79899 Other long term (current) drug therapy: Secondary | ICD-10-CM

## 2024-09-06 DIAGNOSIS — J8410 Pulmonary fibrosis, unspecified: Secondary | ICD-10-CM | POA: Diagnosis present

## 2024-09-06 DIAGNOSIS — Z7902 Long term (current) use of antithrombotics/antiplatelets: Secondary | ICD-10-CM

## 2024-09-06 DIAGNOSIS — I10 Essential (primary) hypertension: Secondary | ICD-10-CM | POA: Diagnosis present

## 2024-09-06 DIAGNOSIS — J4489 Other specified chronic obstructive pulmonary disease: Secondary | ICD-10-CM | POA: Diagnosis present

## 2024-09-06 DIAGNOSIS — K219 Gastro-esophageal reflux disease without esophagitis: Secondary | ICD-10-CM | POA: Diagnosis present

## 2024-09-06 DIAGNOSIS — J9601 Acute respiratory failure with hypoxia: Secondary | ICD-10-CM

## 2024-09-06 DIAGNOSIS — M25512 Pain in left shoulder: Secondary | ICD-10-CM | POA: Diagnosis present

## 2024-09-06 DIAGNOSIS — Z87891 Personal history of nicotine dependence: Secondary | ICD-10-CM

## 2024-09-06 DIAGNOSIS — J9611 Chronic respiratory failure with hypoxia: Secondary | ICD-10-CM

## 2024-09-06 DIAGNOSIS — E785 Hyperlipidemia, unspecified: Secondary | ICD-10-CM | POA: Diagnosis present

## 2024-09-06 DIAGNOSIS — Z66 Do not resuscitate: Secondary | ICD-10-CM | POA: Diagnosis present

## 2024-09-06 DIAGNOSIS — Z955 Presence of coronary angioplasty implant and graft: Secondary | ICD-10-CM

## 2024-09-06 DIAGNOSIS — J189 Pneumonia, unspecified organism: Secondary | ICD-10-CM | POA: Diagnosis present

## 2024-09-06 DIAGNOSIS — I2510 Atherosclerotic heart disease of native coronary artery without angina pectoris: Secondary | ICD-10-CM | POA: Diagnosis present

## 2024-09-06 DIAGNOSIS — I480 Paroxysmal atrial fibrillation: Secondary | ICD-10-CM | POA: Diagnosis present

## 2024-09-06 DIAGNOSIS — I110 Hypertensive heart disease with heart failure: Secondary | ICD-10-CM | POA: Diagnosis present

## 2024-09-06 DIAGNOSIS — Z8249 Family history of ischemic heart disease and other diseases of the circulatory system: Secondary | ICD-10-CM

## 2024-09-06 DIAGNOSIS — Z9981 Dependence on supplemental oxygen: Secondary | ICD-10-CM

## 2024-09-06 DIAGNOSIS — I5032 Chronic diastolic (congestive) heart failure: Secondary | ICD-10-CM | POA: Diagnosis present

## 2024-09-06 DIAGNOSIS — J440 Chronic obstructive pulmonary disease with acute lower respiratory infection: Secondary | ICD-10-CM | POA: Diagnosis present

## 2024-09-06 DIAGNOSIS — Z880 Allergy status to penicillin: Secondary | ICD-10-CM

## 2024-09-06 DIAGNOSIS — J441 Chronic obstructive pulmonary disease with (acute) exacerbation: Secondary | ICD-10-CM | POA: Diagnosis present

## 2024-09-06 LAB — RESP PANEL BY RT-PCR (RSV, FLU A&B, COVID)  RVPGX2
Influenza A by PCR: NEGATIVE
Influenza B by PCR: NEGATIVE
Resp Syncytial Virus by PCR: NEGATIVE
SARS Coronavirus 2 by RT PCR: NEGATIVE

## 2024-09-06 LAB — COMPREHENSIVE METABOLIC PANEL WITH GFR
ALT: 15 U/L (ref 0–44)
AST: 32 U/L (ref 15–41)
Albumin: 3.7 g/dL (ref 3.5–5.0)
Alkaline Phosphatase: 134 U/L — ABNORMAL HIGH (ref 38–126)
Anion gap: 13 (ref 5–15)
BUN: 25 mg/dL — ABNORMAL HIGH (ref 8–23)
CO2: 25 mmol/L (ref 22–32)
Calcium: 9.2 mg/dL (ref 8.9–10.3)
Chloride: 104 mmol/L (ref 98–111)
Creatinine, Ser: 1.29 mg/dL — ABNORMAL HIGH (ref 0.61–1.24)
GFR, Estimated: >60 mL/min (ref >60)
Glucose, Bld: 122 mg/dL — ABNORMAL HIGH (ref 70–99)
Potassium: 4.7 mmol/L (ref 3.5–5.1)
Sodium: 142 mmol/L (ref 135–145)
Total Bilirubin: 0.4 mg/dL (ref 0.0–1.2)
Total Protein: 6.6 g/dL (ref 6.5–8.1)

## 2024-09-06 LAB — URINALYSIS, W/ REFLEX TO CULTURE (INFECTION SUSPECTED)
Bilirubin Urine: NEGATIVE
Glucose, UA: 50 mg/dL — AB
Hgb urine dipstick: NEGATIVE
Ketones, ur: NEGATIVE mg/dL
Leukocytes,Ua: NEGATIVE
Nitrite: NEGATIVE
Protein, ur: 30 mg/dL — AB
Specific Gravity, Urine: 1.018 (ref 1.005–1.030)
pH: 5 (ref 5.0–8.0)

## 2024-09-06 LAB — CBC WITH DIFFERENTIAL/PLATELET
Abs Immature Granulocytes: 0.04 K/uL (ref 0.00–0.07)
Basophils Absolute: 0 K/uL (ref 0.0–0.1)
Basophils Relative: 0 %
Eosinophils Absolute: 0 K/uL (ref 0.0–0.5)
Eosinophils Relative: 0 %
HCT: 34.3 % — ABNORMAL LOW (ref 39.0–52.0)
Hemoglobin: 10.4 g/dL — ABNORMAL LOW (ref 13.0–17.0)
Immature Granulocytes: 0 %
Lymphocytes Relative: 1 %
Lymphs Abs: 0.1 K/uL — ABNORMAL LOW (ref 0.7–4.0)
MCH: 29.1 pg (ref 26.0–34.0)
MCHC: 30.3 g/dL (ref 30.0–36.0)
MCV: 95.8 fL (ref 80.0–100.0)
Monocytes Absolute: 0.2 K/uL (ref 0.1–1.0)
Monocytes Relative: 2 %
Neutro Abs: 8.7 K/uL — ABNORMAL HIGH (ref 1.7–7.7)
Neutrophils Relative %: 97 %
Platelets: 296 K/uL (ref 150–400)
RBC: 3.58 MIL/uL — ABNORMAL LOW (ref 4.22–5.81)
RDW: 17.9 % — ABNORMAL HIGH (ref 11.5–15.5)
WBC: 9.1 K/uL (ref 4.0–10.5)
nRBC: 0 % (ref 0.0–0.2)

## 2024-09-06 LAB — LACTIC ACID, PLASMA
Lactic Acid, Venous: 2.1 mmol/L (ref 0.5–1.9)
Lactic Acid, Venous: 1.1 mmol/L (ref 0.5–1.9)

## 2024-09-06 LAB — PROTIME-INR
INR: 1.1 (ref 0.8–1.2)
Prothrombin Time: 14.3 s (ref 11.4–15.2)

## 2024-09-06 LAB — MAGNESIUM: Magnesium: 1.7 mg/dL (ref 1.7–2.4)

## 2024-09-06 LAB — APTT: aPTT: 41 s — ABNORMAL HIGH (ref 24–36)

## 2024-09-06 MED ORDER — SODIUM CHLORIDE 0.9 % IV SOLN: 2.0000 g | Freq: Once | INTRAVENOUS | Status: DC

## 2024-09-06 MED ORDER — ACETAMINOPHEN 325 MG PO TABS
650.0000 mg | ORAL_TABLET | Freq: Four times a day (QID) | ORAL | Status: DC | PRN
  Administered 2024-09-06 – 2024-09-12 (×6): 650 mg via ORAL
  Filled 2024-09-06 (×6): qty 2

## 2024-09-06 MED ORDER — ACETAMINOPHEN 650 MG RE SUPP
650.0000 mg | Freq: Four times a day (QID) | RECTAL | Status: DC | PRN
Start: 2024-09-06 — End: 2024-09-12

## 2024-09-06 MED ORDER — ENOXAPARIN SODIUM 40 MG/0.4ML IJ SOSY
40.0000 mg | PREFILLED_SYRINGE | INTRAMUSCULAR | Status: DC
  Administered 2024-09-06 – 2024-09-11 (×6): 40 mg via SUBCUTANEOUS
  Filled 2024-09-06 (×6): qty 0.4

## 2024-09-06 MED ORDER — ATORVASTATIN CALCIUM 20 MG PO TABS
40.0000 mg | ORAL_TABLET | Freq: Every day | ORAL | Status: DC
  Administered 2024-09-07 – 2024-09-11 (×5): 40 mg via ORAL
  Filled 2024-09-06 (×5): qty 2

## 2024-09-06 MED ORDER — METOPROLOL SUCCINATE ER 50 MG PO TB24
50.0000 mg | ORAL_TABLET | Freq: Every day | ORAL | Status: DC
  Administered 2024-09-07 – 2024-09-12 (×6): 50 mg via ORAL
  Filled 2024-09-06 (×6): qty 1

## 2024-09-06 MED ORDER — SODIUM CHLORIDE 0.9 % IV SOLN
2.0000 g | INTRAVENOUS | Status: AC
  Administered 2024-09-06 – 2024-09-10 (×4): 2 g via INTRAVENOUS
  Filled 2024-09-06 (×4): qty 20

## 2024-09-06 MED ORDER — SODIUM CHLORIDE 0.9 % IV SOLN
2.0000 g | Freq: Once | INTRAVENOUS | Status: AC
  Administered 2024-09-06: 2 g via INTRAVENOUS
  Filled 2024-09-06: qty 10

## 2024-09-06 MED ORDER — GABAPENTIN 300 MG PO CAPS
300.0000 mg | ORAL_CAPSULE | Freq: Three times a day (TID) | ORAL | Status: DC
  Administered 2024-09-07 – 2024-09-12 (×16): 300 mg via ORAL
  Filled 2024-09-06 (×17): qty 1

## 2024-09-06 MED ORDER — IPRATROPIUM-ALBUTEROL 0.5-2.5 (3) MG/3ML IN SOLN: 3.0000 mL | Freq: Four times a day (QID) | RESPIRATORY_TRACT | Status: DC | PRN

## 2024-09-06 MED ORDER — CALCIUM GLUCONATE-NACL 2-0.675 GM/100ML-% IV SOLN
2.0000 g | Freq: Once | INTRAVENOUS | Status: AC
  Administered 2024-09-06: 2000 mg via INTRAVENOUS
  Filled 2024-09-06: qty 100

## 2024-09-06 MED ORDER — LACTATED RINGERS IV SOLN
150.0000 mL/h | INTRAVENOUS | Status: DC
  Administered 2024-09-06 (×2): 150 mL/h via INTRAVENOUS

## 2024-09-06 MED ORDER — MONTELUKAST SODIUM 10 MG PO TABS
10.0000 mg | ORAL_TABLET | Freq: Every day | ORAL | Status: DC
  Administered 2024-09-07 – 2024-09-12 (×6): 10 mg via ORAL
  Filled 2024-09-06 (×6): qty 1

## 2024-09-06 MED ORDER — SODIUM CHLORIDE 0.9% FLUSH
3.0000 mL | Freq: Two times a day (BID) | INTRAVENOUS | Status: DC
  Administered 2024-09-06 – 2024-09-12 (×12): 3 mL via INTRAVENOUS

## 2024-09-06 MED ORDER — MAGNESIUM OXIDE -MG SUPPLEMENT 400 (240 MG) MG PO TABS
400.0000 mg | ORAL_TABLET | Freq: Every day | ORAL | Status: DC
  Administered 2024-09-07 – 2024-09-12 (×6): 400 mg via ORAL
  Filled 2024-09-06 (×6): qty 1

## 2024-09-06 MED ORDER — ORAL CARE MOUTH RINSE: 15.0000 mL | OROMUCOSAL | Status: DC | PRN

## 2024-09-06 MED ORDER — ASPIRIN 81 MG PO TBEC
81.0000 mg | DELAYED_RELEASE_TABLET | Freq: Every day | ORAL | Status: DC
  Administered 2024-09-07 – 2024-09-12 (×6): 81 mg via ORAL
  Filled 2024-09-06 (×6): qty 1

## 2024-09-06 MED ORDER — ACETAMINOPHEN 500 MG PO TABS
1000.0000 mg | ORAL_TABLET | ORAL | Status: AC
  Administered 2024-09-06: 1000 mg via ORAL
  Filled 2024-09-06: qty 2

## 2024-09-06 MED ORDER — IPRATROPIUM-ALBUTEROL 0.5-2.5 (3) MG/3ML IN SOLN
3.0000 mL | Freq: Once | RESPIRATORY_TRACT | Status: AC
  Administered 2024-09-06: 3 mL via RESPIRATORY_TRACT
  Filled 2024-09-06: qty 3

## 2024-09-06 MED ORDER — CLOPIDOGREL BISULFATE 75 MG PO TABS
75.0000 mg | ORAL_TABLET | Freq: Every day | ORAL | Status: DC
  Administered 2024-09-07 – 2024-09-12 (×6): 75 mg via ORAL
  Filled 2024-09-06 (×6): qty 1

## 2024-09-06 MED ORDER — SODIUM CHLORIDE 0.9 % IV SOLN
500.0000 mg | INTRAVENOUS | Status: AC
  Administered 2024-09-07 – 2024-09-10 (×4): 500 mg via INTRAVENOUS
  Filled 2024-09-06 (×4): qty 5

## 2024-09-06 MED ORDER — OYSTER SHELL CALCIUM/D3 500-5 MG-MCG PO TABS
1.0000 | ORAL_TABLET | Freq: Two times a day (BID) | ORAL | Status: DC
  Administered 2024-09-07 – 2024-09-12 (×11): 1 via ORAL
  Filled 2024-09-06 (×11): qty 1

## 2024-09-06 MED ORDER — PANTOPRAZOLE SODIUM 40 MG PO TBEC
40.0000 mg | DELAYED_RELEASE_TABLET | Freq: Two times a day (BID) | ORAL | Status: DC
  Administered 2024-09-07 – 2024-09-12 (×12): 40 mg via ORAL
  Filled 2024-09-06 (×12): qty 1

## 2024-09-06 MED ORDER — VANCOMYCIN HCL IN DEXTROSE 1-5 GM/200ML-% IV SOLN
1000.0000 mg | Freq: Once | INTRAVENOUS | Status: AC
  Administered 2024-09-06: 1000 mg via INTRAVENOUS
  Filled 2024-09-06: qty 200

## 2024-09-06 MED ORDER — IPRATROPIUM-ALBUTEROL 0.5-2.5 (3) MG/3ML IN SOLN: 3.0000 mL | Freq: Four times a day (QID) | RESPIRATORY_TRACT | Status: DC

## 2024-09-06 MED ORDER — SENNOSIDES-DOCUSATE SODIUM 8.6-50 MG PO TABS
1.0000 | ORAL_TABLET | Freq: Every evening | ORAL | Status: DC | PRN
  Administered 2024-09-10: 1 via ORAL
  Filled 2024-09-06: qty 1

## 2024-09-06 MED ORDER — PREDNISONE 20 MG PO TABS
40.0000 mg | ORAL_TABLET | Freq: Every day | ORAL | Status: DC
  Administered 2024-09-07 – 2024-09-09 (×3): 40 mg via ORAL
  Filled 2024-09-06 (×3): qty 2

## 2024-09-06 NOTE — ED Provider Notes (Signed)
 [facility] Provider Note    Event Date/Time   First MD Initiated Contact with Patient 09/06/24 1547     (approximate)   History   Weakness   HPI  Austin Patterson is a 68 y.o. male with active lung cancer, reviewed recent oncology notes  2 days of bodyaches chills fevers.  Building slower stumps of breath over the last 2 days as well.  Patient reports that he feels like he is sick.  He has had cough congestion and shortness of breath along with fevers and bodyaches.  No chest pain no abdominal pain no nausea or vomiting.  No rash no swelling no skin breakdown no headache  Went and got his haircut prior to coming to the ER today but feels like his symptoms are really getting in with the bad body aches     Physical Exam   Triage Vital Signs: ED Triage Vitals  Encounter Vitals Group     BP 09/06/24 1409 (!) 146/99     Girls Systolic BP Percentile --      Girls Diastolic BP Percentile --      Boys Systolic BP Percentile --      Boys Diastolic BP Percentile --      Pulse Rate 09/06/24 1409 (!) 114     Resp 09/06/24 1409 17     Temp 09/06/24 1411 (!) 100.8 F (38.2 C)     Temp Source 09/06/24 1411 Axillary     SpO2 09/06/24 1403 (!) 76 %     Weight 09/06/24 1403 173 lb (78.5 kg)     Height --      Head Circumference --      Peak Flow --      Pain Score 09/06/24 1403 6     Pain Loc --      Pain Education --      Exclude from Growth Chart --     Most recent vital signs: Vitals:   09/06/24 1600 09/06/24 1614  BP: 122/76   Pulse: (!) 109   Resp: (!) 24   Temp:  99.2 F (37.3 C)  SpO2: 100%      General: Awake, no distress Slight tachypnea fully alert and oriented pleasant currently on oxygen  2 L at baseline but currently on 6 L CV:  Good peripheral perfusion.  Mild tachycardia no murmur Resp:  Diminished lung sounds, occasional congested cough and slight central rhonchi.  No wheezing.  Work of breathing with mild tachypnea Abd:  No  distention.  Soft nontender nondistended throughout Other:  No rash.  No photophobia.  Moves extremities well   ED Results / Procedures / Treatments   Labs (all labs ordered are listed, but only abnormal results are displayed) Labs Reviewed  LACTIC ACID, PLASMA - Abnormal; Notable for the following components:      Result Value   Lactic Acid, Venous 2.1 (*)    All other components within normal limits  COMPREHENSIVE METABOLIC PANEL WITH GFR - Abnormal; Notable for the following components:   Glucose, Bld 122 (*)    BUN 25 (*)    Creatinine, Ser 1.29 (*)    Alkaline Phosphatase 134 (*)    All other components within normal limits  CBC WITH DIFFERENTIAL/PLATELET - Abnormal; Notable for the following components:   RBC 3.58 (*)    Hemoglobin 10.4 (*)    HCT 34.3 (*)    RDW 17.9 (*)    Neutro Abs 8.7 (*)  Lymphs Abs 0.1 (*)    All other components within normal limits  URINALYSIS, W/ REFLEX TO CULTURE (INFECTION SUSPECTED) - Abnormal; Notable for the following components:   Color, Urine YELLOW (*)    APPearance CLEAR (*)    Glucose, UA 50 (*)    Protein, ur 30 (*)    Bacteria, UA RARE (*)    All other components within normal limits  RESP PANEL BY RT-PCR (RSV, FLU A&B, COVID)  RVPGX2  CULTURE, BLOOD (ROUTINE X 2)  CULTURE, BLOOD (ROUTINE X 2)  LACTIC ACID, PLASMA     RADIOLOGY  Chest x-ray interpreted by me as bilateral infiltrative pattern concerning for pneumonia     PROCEDURES:  Critical Care performed: Yes, see critical care procedure note(s)  Upon review of the patient's chest x-ray highly concerning for sepsis with pneumonia.  Will initiate code sepsis, broad-spectrum antibiotics with concern for potential underlying structural lung disease or at risk for healthcare associated pneumonia.  Penicillin allergy severe.  Procedures   MEDICATIONS ORDERED IN ED: Medications  vancomycin  (VANCOCIN ) IVPB 1000 mg/200 mL premix (1,000 mg Intravenous New Bag/Given  09/06/24 1616)  acetaminophen  (TYLENOL ) tablet 1,000 mg (1,000 mg Oral Given 09/06/24 1459)  aztreonam  (AZACTAM ) 2 g in sodium chloride  0.9 % 100 mL IVPB (0 g Intravenous Stopped 09/06/24 1614)  ipratropium-albuterol  (DUONEB) 0.5-2.5 (3) MG/3ML nebulizer solution 3 mL (3 mLs Nebulization Given 09/06/24 1538)   No frank wheezing and he reports no wheezing, but given his history will give 1 DuoNeb though I do not feel there is obvious evidence of exacerbation of underlying COPD at this time.  IMPRESSION / MDM / ASSESSMENT AND PLAN / ED COURSE  I reviewed the triage vital signs and the nursing notes.                              Differential diagnosis includes, but is not limited to, pneumonia viral illness pulmonary disease based on symptoms, less likely would entertain cause such as thromboembolism PE etc. if there were no clear findings of pneumonia on chest x-ray.  However, after reviewing his chest x-ray it does appear that he has evidence of bilateral infiltrative process with his body aches chills cough and examination with rhonchi consistent with pneumonia likely multifocal.  Sepsis.  Code sepsis initiated.  Patient understand agree with plan for admission  Patient's presentation is most consistent with acute presentation with potential threat to life or bodily function.   The patient is on the cardiac monitor to evaluate for evidence of arrhythmia and/or significant heart rate changes.  Ongoing care is assigned to Dr. Fernand, as patient has no lab results yet.  We have started him on antibiotic suspicion high for pneumonia.  Follow-up on pending testing patient will clearly require hospitalization and admission but multiple labs are still pending at this time      FINAL CLINICAL IMPRESSION(S) / ED DIAGNOSES   Final diagnoses:  Sepsis, due to unspecified organism, unspecified whether acute organ dysfunction present (HCC)  Pneumonia of both lungs due to infectious organism, unspecified  part of lung  Acute hypoxic respiratory failure (HCC)     Rx / DC Orders   ED Discharge Orders     None        Note:  This document was prepared using Dragon voice recognition software and may include unintentional dictation errors.   Dicky Anes, MD 09/06/24 [phone]

## 2024-09-06 NOTE — Sepsis Progress Note (Signed)
 eLink is following this Code Sepsis.

## 2024-09-06 NOTE — Patient Instructions (Signed)
Calcium Gluconate Injection What is this medication? CALCIUM GLUCONATE (KAL see um GLOO koe nate) increases calcium levels in your body. Calcium is a mineral that plays an important role in building strong bones and maintaining heart health. This medicine may be used for other purposes; ask your health care provider or pharmacist if you have questions. What should I tell my care team before I take this medication? They need to know if you have any of these conditions: High levels of calcium in the blood History of irregular heartbeat History of kidney stones Kidney disease Parathyroid disease An unusual or allergic reaction to calcium, other medications, foods, dyes, or preservatives Pregnant or trying to get pregnant Breast-feeding How should I use this medication? This medication is injected into a vein. It is given by your care team in a hospital or clinic setting. Talk to your care team about the use of this medication in children. While it may be given to children for selected conditions, precautions do apply. Overdosage: If you think you have taken too much of this medicine contact a poison control center or emergency room at once. NOTE: This medicine is only for you. Do not share this medicine with others. What if I miss a dose? This does not apply. What may interact with this medication? Ceftriaxone Certain diuretics Digoxin Other calcium products This list may not describe all possible interactions. Give your health care provider a list of all the medicines, herbs, non-prescription drugs, or dietary supplements you use. Also tell them if you smoke, drink alcohol, or use illegal drugs. Some items may interact with your medicine. What should I watch for while using this medication? Your condition will be monitored carefully while you are receiving this medication. You may need blood work while you are taking this medication. What side effects may I notice from receiving this  medication? Side effects that you should report to your care team as soon as possible: Allergic reactions--skin rash, itching, hives, swelling of the face, lips, tongue, or throat Fast or irregular heartbeat High calcium level--increased thirst or amount of urine, nausea, vomiting, confusion, unusual weakness or fatigue, bone pain Low blood pressure--dizziness, feeling faint or lightheaded, blurry vision Pain, redness, or irritation at injection site Side effects that usually do not require medical attention (report to your care team if they continue or are bothersome): Change in taste Flushing, mostly over the face, neck, and chest, during injection This list may not describe all possible side effects. Call your doctor for medical advice about side effects. You may report side effects to FDA at 1-800-FDA-1088. Where should I keep my medication? This medication is given in a hospital or clinic. It will not be stored at home. NOTE: This sheet is a summary. It may not cover all possible information. If you have questions about this medicine, talk to your doctor, pharmacist, or health care provider.  2024 [employer]/Gold Standard (2022-04-27 00:00:00)

## 2024-09-06 NOTE — Consult Note (Signed)
 CODE SEPSIS - PHARMACY COMMUNICATION  **Broad Spectrum Antibiotics should be administered within 1 hour of Sepsis diagnosis**  Time Code Sepsis Called/Page Received: 1527  Antibiotics Ordered: aztreonam, vancomycin   Time of 1st antibiotic administration: 1535  Additional action taken by pharmacy: n/a  If necessary, Name of Provider/Nurse Contacted: n/a    Austin Patterson A Deshaun Schou ,PharmD Clinical Pharmacist  09/06/2024  3:38 PM

## 2024-09-06 NOTE — Progress Notes (Signed)
 Unit RN able to establish PIV.

## 2024-09-06 NOTE — ED Provider Notes (Signed)
 1635 -patient signed out to me awaiting labs and imaging.  Concern of sepsis suspected underlying pneumonia.  He does have underlying history of lung cancer.  No leukocytosis, does have an elevated lactic acidosis, increasing suspicion for underlying pneumonia he is getting antibiotics for this.  Reach out to medicine for admission.  I spoke with Dr. Morene who has agreed to evaluate the patient to determine course of further medical management.   Fernand Rossie HERO, MD 09/06/24 [phone]

## 2024-09-06 NOTE — H&P (Addendum)
 History and Physical    Austin Patterson FMW:[id number] DOB: Apr 29, 1956 DOA: 09/06/2024  DOS: the patient was seen and examined on 09/06/2024  PCP: Autry Grayce LABOR, PA   Patient coming from: Home  I have personally briefly reviewed patient's old medical records in [facility] and CareEverywhere  HPI:   Austin Patterson is a 68 y.o. year old male with medical history of HTN, HLD, Stage IV Lung Cancer, Asthma, COPD and paroxymal atrial fibrillation presenting to the ED with weakness along with muscles aches, fevers and chills. He also reports coughing with some sputum production.    Pt states his symptoms started about 2 days ago. He is on 2 to 3 L at home but even with this he was having shortness of breath where he needed to increase his oxygen  level.  He denies any sick contacts.    On arrival to the ED patient was noted to be HDS stable.  Lab work and imaging obtained.  CBC without leukocytosis, baseline hemoglobin.  CMP with slight creatinine elevation but no electrolyte abnormalities, slight increase in ALP.  Lactic acid checked and slightly elevated but resolved with IVF.  Negative for RSV, flu, COVID.  UA without signs of infection.  Magnesium  low at 1.7.  Chest x-ray with concern for left lower lobe pneumonia and small to moderate right pleural effusion.  EDP started patient on aztreonam and vancomycin  given somewhat recent hospitalization.  Given this, TR[facility] contacted for admission.  Review of Systems: As mentioned in the history of present illness. All other systems reviewed and are negative.   Past Medical History:  Diagnosis Date   Abdominal pain 12/07/2022   Acute urinary retention 12/07/2022   Cancer (HCC)    Chest pain 01/24/2022   COPD (chronic obstructive pulmonary disease) (HCC)    GERD (gastroesophageal reflux disease)    Hemothorax on right 05/11/2023   Hyperlipidemia    Hypertension    Pleuritic pain 05/10/2023   Pneumothorax after biopsy 05/11/2023    Post procedure discomfort 05/13/2023   Pulmonary fibrosis (HCC) 11/2015    Past Surgical History:  Procedure Laterality Date   COLONOSCOPY     CORONARY STENT PLACEMENT     ESOPHAGOGASTRODUODENOSCOPY (EGD) WITH PROPOFOL  N/A 09/23/2016   Procedure: ESOPHAGOGASTRODUODENOSCOPY (EGD) WITH PROPOFOL ;  Surgeon: Ruel Kung, MD;  Location: [facility] ENDOSCOPY;  Service: Endoscopy;  Laterality: N/A;   IR IMAGING GUIDED PORT INSERTION  05/26/2023   IR IMAGING GUIDED PORT INSERTION  07/30/2023   IR RADIOLOGIST EVAL & MGMT  07/20/2023   KYPHOPLASTY N/A 03/14/2020   Procedure: T7 & T11 KYPHOPLASTY;  Surgeon: Kathlynn Sharper, MD;  Location: [facility] ORS;  Service: Orthopedics;  Laterality: N/A;   PORT-A-CATH REMOVAL N/A 06/14/2023   Procedure: REMOVAL PORT-A-CATH;  Surgeon: Tye Millet, DO;  Location: [facility] ORS;  Service: General;  Laterality: N/A;   SHOULDER ACROMIOPLASTY       Allergies  Allergen Reactions   Amoxicillin  Anaphylaxis   Tizanidine      Feet and ankle swell     Family History  Problem Relation Age of Onset   Heart disease Mother     Prior to Admission medications   Medication Sig Start Date End Date Taking? Authorizing Provider  acetaminophen  (TYLENOL ) 500 MG tablet Take 1,000 mg by mouth every 6 (six) hours as needed for moderate pain or headache.    [provider]  albuterol  (PROVENTIL ) (2.5 MG/3ML) 0.083% nebulizer solution Take 2.5 mg by nebulization every 4 (four) hours. 05/23/24  [provider]  albuterol  (VENTOLIN  HFA) 108 (90 Base) MCG/ACT inhaler Inhale 2 puffs into the lungs every 6 (six) hours as needed for wheezing or shortness of breath. 01/29/20   Danford, Lonni SQUIBB, MD  amLODipine  (NORVASC ) 5 MG tablet Take 1 tablet (5 mg total) by mouth at bedtime. Skip the dose if systolic BP less than 130 mmHg 06/22/23 09/04/24  Von Bellis, MD  aspirin  EC 81 MG tablet Take 1 tablet (81 mg total) by mouth daily. 08/19/16   Chaplin, Don C, MD  atorvastatin  (LIPITOR) 40 MG  tablet Take 1 tablet (40 mg total) by mouth at bedtime. 08/19/16   Chaplin, Don C, MD  azithromycin  (ZITHROMAX ) 500 MG tablet Take 500 mg by mouth daily. Patient not taking: Reported on 09/04/2024 07/10/24   [provider]  CALCIUM  600/VITAMIN D  600-10 MG-MCG TABS Take 1 tablet by mouth 2 (two) times daily. 11/20/21   [provider]  clopidogrel  (PLAVIX ) 75 MG tablet Take 1 tablet (75 mg total) by mouth daily. 08/19/16   Chaplin, Don C, MD  Dupilumab  (DUPIXENT ) 300 MG/2ML SOAJ Inject 300 mg into the skin every 14 (fourteen) days. 08/25/24   Tamea Dedra CROME, MD  EPINEPHrine  0.3 mg/0.3 mL IJ SOAJ injection Inject 0.3 mg into the muscle as needed for anaphylaxis.    [provider]  ferrous sulfate  325 (65 FE) MG tablet Take 1 tablet (325 mg total) by mouth 2 (two) times daily with a meal. Patient not taking: Reported on 09/04/2024 06/22/23 08/07/24  Von Bellis, MD  fluticasone -salmeterol (ADVAIR  HFA) 115-21 MCG/ACT inhaler Inhale 2 puffs into the lungs 2 (two) times daily. Patient not taking: Reported on 09/04/2024 07/18/24   Wouk, Devaughn Sayres, MD  furosemide  (LASIX ) 20 MG tablet Take 1 tablet (20 mg total) by mouth daily. Patient not taking: Reported on 09/04/2024 07/03/24   Babara Call, MD  gabapentin  (NEURONTIN ) 300 MG capsule TAKE 1 CAPSULE BY MOUTH THREE TIMES A DAY 07/19/24   Babara Call, MD  guaiFENesin -codeine  100-10 MG/5ML syrup TAKE 10 ML BY MOUTH 3 TIMES A DAY AS NEEDED FOR COUGH Patient not taking: Reported on 09/04/2024 07/10/24   Tamea Dedra CROME, MD  ipratropium-albuterol  (DUONEB) 0.5-2.5 (3) MG/3ML SOLN INHALE 1 VIAL THROUGH NEBULIZER EVERY 6 HOURS 05/01/24   Tamea Dedra CROME, MD  lidocaine -prilocaine  (EMLA ) cream Apply to affected area once 04/03/24   Babara Call, MD  magnesium  oxide (MAG-OX) 400 (241.3 Mg) MG tablet Take 1 tablet (400 mg total) by mouth daily. 06/18/17   Sudini, Srikar, MD  metoprolol  succinate (TOPROL -XL) 50 MG 24 hr tablet Take 50 mg by mouth  daily. 01/24/24   [provider]  montelukast  (SINGULAIR ) 10 MG tablet Take 10 mg by mouth daily. 11/19/21   [provider]  Multiple Vitamin (MULTIVITAMIN WITH MINERALS) TABS tablet Take 1 tablet by mouth daily. 09/24/16   Patel, Sona, MD  omega-3 acid ethyl esters (LOVAZA) 1 g capsule Take 1 g by mouth daily. Patient not taking: Reported on 09/04/2024    [provider]  oxyCODONE  (OXY IR/ROXICODONE ) 5 MG immediate release tablet Take 1 tablet (5 mg total) by mouth every 8 (eight) hours as needed for severe pain (pain score 7-10). 07/27/24   Babara Call, MD  pantoprazole  (PROTONIX ) 40 MG tablet Take 1 tablet (40 mg total) by mouth 2 (two) times daily. 09/23/16   Patel, Sona, MD  potassium chloride  SA (KLOR-CON  M) 20 MEQ tablet Take 20 mEq by mouth daily. 10/05/23   [provider]  predniSONE  (DELTASONE ) 10 MG tablet TAKE 1 TABLET (10 MG TOTAL) BY MOUTH DAILY AS NEEDED. TAKE AS DIRECTED. Patient not taking: Reported on 09/04/2024 04/17/24   Tamea Dedra CROME, MD  Spacer/Aero-Holding Chambers (AEROCHAMBER MV) inhaler Use as instructed Patient not taking: Reported on 09/04/2024 10/05/19   Tamea Dedra CROME, MD  sucralfate  (CARAFATE ) 1 g tablet Take 1 tablet (1 g total) by mouth 3 (three) times daily before meals. Dissolve in 4tbs warm water , swish and swallow Patient not taking: Reported on 09/04/2024 01/17/24   Lenn Aran, MD    Social History:  reports that he quit smoking about 8 years ago. His smoking use included cigarettes. He started smoking about 58 years ago. He has a 100 pack-year smoking history. He has quit using smokeless tobacco. He reports current alcohol use of about 56.0 standard drinks of alcohol per week. He reports that he does not use drugs. Lives by self  Tobacco- previous smoker quit 2016 EtOH- Denies use.  Illicit drug use- denies use.  IADLs/ADLs- can perform independently at baseline    Physical Exam: Vitals:   09/06/24 1900  09/06/24 1924 09/06/24 1937 09/06/24 1947  BP: (!) 145/78  120/83   Pulse: (!) 114 (!) 111 (!) 136 (!) 114  Resp: (!) 36 (!) 26 (!) 29 (!) 29  Temp:      TempSrc:      SpO2: 98% 100% 95% 99%  Weight:         Gen: NAD HENT: NCAT, nasal cannula in place CV: Sinus tachycardia, good radial pulse Resp: Bilateral wheeze present Abd: No TTP, normal bowel sounds MSK: No asymmetry Skin: No lesions noted on skin Neuro: Alert and oriented x 4 Psych: Pleasant mood   Labs on Admission: I have personally reviewed following labs and imaging studies  CBC: Recent Labs  Lab 09/04/24 1141 09/06/24 1405  WBC 6.2 9.1  NEUTROABS 5.1 8.7*  HGB 10.1* 10.4*  HCT 33.1* 34.3*  MCV 93.0 95.8  PLT 240 296   Basic Metabolic Panel: Recent Labs  Lab 09/04/24 1141 09/06/24 1405  NA 137 142  K 4.2 4.7  CL 101 104  CO2 27 25  GLUCOSE 96 122*  BUN 19 25*  CREATININE 1.30* 1.29*  CALCIUM  7.9* 9.2  MG  --  1.7   GFR: Estimated Creatinine Clearance: 51.2 mL/min (A) (by C-G formula based on SCr of 1.29 mg/dL (H)). Liver Function Tests: Recent Labs  Lab 09/04/24 1141 09/06/24 1405  AST 22 32  ALT 15 15  ALKPHOS 112 134*  BILITOT 0.4 0.4  PROT 6.7 6.6  ALBUMIN 3.5 3.7   No results for input(s): LIPASE, AMYLASE in the last 168 hours. No results for input(s): AMMONIA in the last 168 hours. Coagulation Profile: No results for input(s): INR, PROTIME in the last 168 hours. Cardiac Enzymes: No results for input(s): CKTOTAL, CKMB, CKMBINDEX, TROPONINI, TROPONINIHS in the last 168 hours. BNP (last 3 results) Recent Labs    07/13/24 1335  BNP 70.6   HbA1C: No results for input(s): HGBA1C in the last 72 hours. CBG: No results for input(s): GLUCAP in the last 168 hours. Lipid Profile: No results for input(s): CHOL, HDL, LDLCALC, TRIG, CHOLHDL, LDLDIRECT in the last 72 hours. Thyroid  Function Tests: No results for input(s): TSH, T4TOTAL, FREET4,  T3FREE, THYROIDAB in the last 72 hours. Anemia Panel: No results for input(s): VITAMINB12, FOLATE, FERRITIN, TIBC, IRON , RETICCTPCT in the last 72 hours. Urine analysis:    Component Value  Date/Time   COLORURINE YELLOW (A) 09/06/2024 1405   APPEARANCEUR CLEAR (A) 09/06/2024 1405   LABSPEC 1.018 09/06/2024 1405   PHURINE 5.0 09/06/2024 1405   GLUCOSEU 50 (A) 09/06/2024 1405   HGBUR NEGATIVE 09/06/2024 1405   BILIRUBINUR NEGATIVE 09/06/2024 1405   KETONESUR NEGATIVE 09/06/2024 1405   PROTEINUR 30 (A) 09/06/2024 1405   NITRITE NEGATIVE 09/06/2024 1405   LEUKOCYTESUR NEGATIVE 09/06/2024 1405    Radiological Exams on Admission: I have personally reviewed images CT CHEST WO CONTRAST Result Date: 09/06/2024 CLINICAL DATA:  Pneumonia.  History of lung cancer. EXAM: CT CHEST WITHOUT CONTRAST TECHNIQUE: Multidetector CT imaging of the chest was performed following the standard protocol without IV contrast. RADIATION DOSE REDUCTION: This exam was performed according to the departmental dose-optimization program which includes automated exposure control, adjustment of the mA and/or kV according to patient size and/or use of iterative reconstruction technique. COMPARISON:  Chest x-ray same day.  CT angiogram chest 07/13/2024. FINDINGS: Cardiovascular: There is a trace pericardial effusion, unchanged. Heart is mildly enlarged. Aorta is normal in size. There are atherosclerotic calcifications of the aorta and coronary arteries. Left chest port catheter tip ends in the SVC. Mediastinum/Nodes: No enlarged mediastinal or axillary lymph nodes. Thyroid  gland, trachea, and esophagus demonstrate no significant findings. Lungs/Pleura: Left pleural effusion has nearly completely resolved. Small to moderate-sized right pleural effusion is unchanged, partially loculated. Severe emphysema again seen with large bulla in the right lower hemithorax. There are numerous new bilateral pulmonary nodules, more  significant surrounding the lung lung measuring up to 14 mm. There is a new nodular mass in the right lung base measuring 2.6 cm image 3/110. Masslike density in the anterior right lower hemithorax extending to the hilum appears unchanged. There is no new focal lung infiltrate. Upper Abdomen: No acute abnormality. Musculoskeletal: There are vertebroplasty changes are similar to prior. No new fractures are identified. IMPRESSION: 1. New bilateral pulmonary nodules worrisome for metastatic disease. 2. Stable masslike density in the anterior right lower hemithorax extending to the hilum. 3. Stable small to moderate-sized right pleural effusion, partially loculated. 4. Near complete resolution of left pleural effusion. 5. Stable trace pericardial effusion. Aortic Atherosclerosis (ICD10-I70.0) and Emphysema (ICD10-J43.9). Electronically Signed   By: Greig Pique M.D.   On: 09/06/2024 19:46   DG Chest Port 1 View Result Date: 09/06/2024 EXAM: 1 VIEW(S) XRAY OF THE CHEST 09/06/2024 02:55:00 PM COMPARISON: 08/23/2024 CLINICAL HISTORY: Oxygen  dependent 808842 FINDINGS: LINES, TUBES AND DEVICES: Left chest port with tip at SVC. LUNGS AND PLEURA: The known right middle lobe and medial left lung base lesions are suboptimally visualized on the current radiograph. Again seen is diffuse interstitial prominence with progressive patchy nodular densities in the left base, which may reflect aspiration or pneumonia. Small to moderate right pleural effusion is slightly increased in the interval. No pneumothorax. HEART AND MEDIASTINUM: No acute abnormality of the cardiac and mediastinal silhouettes. BONES AND SOFT TISSUES: No acute osseous abnormality. IMPRESSION: 1. Diffuse interstitial prominence with progressive patchy nodular densities in the left lung base, possibly reflecting aspiration or pneumonia. 2. Small to moderate right pleural effusion, slightly increased in the interval. Electronically signed by: Waddell Calk MD  09/06/2024 03:48 PM EST RP Workstation: [id number]    EKG: My personal interpretation of EKG shows: Pending    Assessment/Plan Principal Problem:   Acute on chronic hypoxic respiratory failure (HCC) Active Problems:   CAD (coronary artery disease)   Pulmonary fibrosis (HCC)   Essential hypertension   Hyperlipidemia  Asthma-COPD overlap syndrome (HCC)   GERD without esophagitis   Stage IV adenocarcinoma of lung (HCC)   Paroxysmal atrial fibrillation (HCC)   Acute on chronic hypoxic respiratory failure likely multifactorial with community acquired pneumonia and COPD exacerbation.  X-ray along with fevers and chills are concerning for pneumonia.  This can be treated with CAP coverage.  Given he has history of lung cancer and pulmonary fibrosis will get CT for better imaging.  Will also start him on prednisone  for 5 days given COPD exacerbation.  The CT will also give further characterization of the pleural effusion.  Will follow-up on cultures, monitor fever curve, monitor leukocytosis and wean oxygen  as able.  Will follow-up on CT scan.  Hypertension: Holding home antihypertensives given active infection and normotensive blood pressures.  Hyperlipidemia: Continue home statin.  GERD: Continue home PPI  HFpEF: Continue home Lasix .  Patient appears euvolemic on exam.  Last EF 2 months ago was 55%.  Paroxysmal A-fib: In sinus rhythm.  Stage IV lung cancer: Patient follows with oncology.  Currently getting gemcitabine .  Goal is palliative in nature.  Patient with previous history of loculated right-sided effusion that was present and discussed on recent visit on 09/04/2024.   VTE prophylaxis:  Lovenox   Diet: Regular per patient Code Status:  Full Code Telemetry:  Admission status: Inpatient, Telemetry bed Patient is from: Home Anticipated d/c is to: Home Anticipated d/c is in: 2-3 days   Family Communication: Updated at bedside  Consults called: None   Severity of  Illness: The appropriate patient status for this patient is INPATIENT. Inpatient status is judged to be reasonable and necessary in order to provide the required intensity of service to ensure the patient's safety. The patient's presenting symptoms, physical exam findings, and initial radiographic and laboratory data in the context of their chronic comorbidities is felt to place them at high risk for further clinical deterioration. Furthermore, it is not anticipated that the patient will be medically stable for discharge from the hospital within 2 midnights of admission.   * I certify that at the point of admission it is my clinical judgment that the patient will require inpatient hospital care spanning beyond 2 midnights from the point of admission due to high intensity of service, high risk for further deterioration and high frequency of surveillance required.DEWAINE Morene Bathe, MD [facility]

## 2024-09-06 NOTE — ED Triage Notes (Signed)
 Pt sts that he had a calcium  infusion done yesterday with his chemo and today he is just so weak he can't walk.

## 2024-09-07 DIAGNOSIS — I2510 Atherosclerotic heart disease of native coronary artery without angina pectoris: Secondary | ICD-10-CM

## 2024-09-07 DIAGNOSIS — C3491 Malignant neoplasm of unspecified part of right bronchus or lung: Secondary | ICD-10-CM | POA: Diagnosis not present

## 2024-09-07 DIAGNOSIS — J9621 Acute and chronic respiratory failure with hypoxia: Secondary | ICD-10-CM | POA: Diagnosis not present

## 2024-09-07 DIAGNOSIS — I10 Essential (primary) hypertension: Secondary | ICD-10-CM

## 2024-09-07 LAB — BASIC METABOLIC PANEL WITH GFR
Anion gap: 11 (ref 5–15)
BUN: 23 mg/dL (ref 8–23)
CO2: 27 mmol/L (ref 22–32)
Calcium: 8.3 mg/dL — ABNORMAL LOW (ref 8.9–10.3)
Chloride: 104 mmol/L (ref 98–111)
Creatinine, Ser: 1.23 mg/dL (ref 0.61–1.24)
GFR, Estimated: >60 mL/min (ref >60)
Glucose, Bld: 141 mg/dL — ABNORMAL HIGH (ref 70–99)
Potassium: 4.4 mmol/L (ref 3.5–5.1)
Sodium: 142 mmol/L (ref 135–145)

## 2024-09-07 LAB — CBC
HCT: 30.1 % — ABNORMAL LOW (ref 39.0–52.0)
Hemoglobin: 9.2 g/dL — ABNORMAL LOW (ref 13.0–17.0)
MCH: 28.8 pg (ref 26.0–34.0)
MCHC: 30.6 g/dL (ref 30.0–36.0)
MCV: 94.1 fL (ref 80.0–100.0)
Platelets: 262 K/uL (ref 150–400)
RBC: 3.2 MIL/uL — ABNORMAL LOW (ref 4.22–5.81)
RDW: 17.6 % — ABNORMAL HIGH (ref 11.5–15.5)
WBC: 7.8 K/uL (ref 4.0–10.5)
nRBC: 0 % (ref 0.0–0.2)

## 2024-09-07 MED ORDER — IPRATROPIUM-ALBUTEROL 0.5-2.5 (3) MG/3ML IN SOLN
3.0000 mL | Freq: Four times a day (QID) | RESPIRATORY_TRACT | Status: DC
  Administered 2024-09-07 – 2024-09-08 (×7): 3 mL via RESPIRATORY_TRACT
  Filled 2024-09-07 (×7): qty 3

## 2024-09-07 MED ORDER — LACTATED RINGERS IV SOLN: INTRAVENOUS | Status: AC

## 2024-09-07 MED ORDER — OXYCODONE HCL 5 MG PO TABS
5.0000 mg | ORAL_TABLET | Freq: Four times a day (QID) | ORAL | Status: DC | PRN
  Administered 2024-09-07: 5 mg via ORAL
  Filled 2024-09-07: qty 1

## 2024-09-07 NOTE — Evaluation (Signed)
 Physical Therapy Evaluation Patient Details Name: Austin Patterson MRN: [id number] DOB: November 02, 1955 Today's Date: 09/07/2024  History of Present Illness  Austin Patterson is a 68 y.o. year old male with medical history of HTN, HLD, Stage IV Lung Cancer, Asthma, COPD and paroxymal atrial fibrillation presenting with weakness along with muscles aches, fevers and chills. He also reports coughing with some sputum production. MD dx include: Acute on chronic hypoxic respiratory failure, pulmonary fibrosis, HLD, GERD, and HFpEF.  Clinical Impression  Pt is pleasant 68 y.o. male admitted for acute on chronic hypoxic respiratory failure. Prior to hospitalization, pt reports mod I with use of rollator for short distances. Pt reports IND with basic ADLs. This evaluation, pt was limited by his respiratory status and able to amb 80ft x 2 with CGA. Pt performs STS with min A. Pt on 5L upon entry to room and desatted to 76% after standing. Supplemental O2 inc to 10L in order for pt to recover SpO2. While amb, pt desatted to 87% on 10L. Pt educated on continued monitoring of SpO2 at home using pulse ox, as well as continued use of incentive spirometer. Pt demosntrates deficits in activity tolerance/balance. Would benefit from skilled PT to address above deficits and promote optimal return to PLOF.       If plan is discharge home, recommend the following: A little help with walking and/or transfers;A little help with bathing/dressing/bathroom;Assistance with cooking/housework;Help with stairs or ramp for entrance   Can travel by private vehicle   Yes    Equipment Recommendations None recommended by PT (Pt has needed DME at home)  Recommendations for Other Services       Functional Status Assessment Patient has had a recent decline in their functional status and demonstrates the ability to make significant improvements in function in a reasonable and predictable amount of time.     Precautions /  Restrictions Precautions Precautions: Fall Recall of Precautions/Restrictions: Intact Restrictions Weight Bearing Restrictions Per Provider Order: No      Mobility  Bed Mobility               General bed mobility comments: NT. Pt sitting EOB at beginning of session and left in recliner post session.    Transfers Overall transfer level: Needs assistance Equipment used: Rolling walker (2 wheels) Transfers: Sit to/from Stand Sit to Stand: Contact guard assist, Min assist           General transfer comment: CGA for STS from bed and recliner, however pt neede min A for eccentric phase of sitting for control.    Ambulation/Gait Ambulation/Gait assistance: Contact guard assist Gait Distance (Feet):  (28ft x 2) Assistive device: None Gait Pattern/deviations: Step-through pattern, Wide base of support, Decreased dorsiflexion - right, Decreased dorsiflexion - left, Decreased stride length Gait velocity: dec     General Gait Details: Pt amb with very wide BOS. Pt declines use of RW d/t short distances ambulated.  Stairs            Wheelchair Mobility     Tilt Bed    Modified Rankin (Stroke Patients Only)       Balance Overall balance assessment: Needs assistance Sitting-balance support: No upper extremity supported, Feet supported Sitting balance-Leahy Scale: Good     Standing balance support: No upper extremity supported, During functional activity Standing balance-Leahy Scale: Fair Standing balance comment: Able to maintain standing balance while using urinal. minimal postural sway with satic stand and no UE support  Pertinent Vitals/Pain Pain Assessment Pain Assessment: No/denies pain    Home Living Family/patient expects to be discharged to:: Private residence Living Arrangements: Alone Available Help at Discharge: Family;Available PRN/intermittently Type of Home: House Home Access: Ramped entrance        Home Layout: One level Home Equipment: Cane - single point;Rollator (4 wheels);Rolling Walker (2 wheels);Shower seat Additional Comments: pt reports also having a walking stick    Prior Function Prior Level of Function : Independent/Modified Independent             Mobility Comments: Pt reports using rollator for mobility and no device around the home for short distances. Pt uses 2-3L of O2 at baseline ADLs Comments: IND with basic ADLs     Extremity/Trunk Assessment   Upper Extremity Assessment Upper Extremity Assessment: Generalized weakness    Lower Extremity Assessment Lower Extremity Assessment: Generalized weakness    Cervical / Trunk Assessment Cervical / Trunk Assessment: Kyphotic  Communication   Communication Communication: No apparent difficulties    Cognition Arousal: Alert Behavior During Therapy: WFL for tasks assessed/performed   PT - Cognitive impairments: No apparent impairments                       PT - Cognition Comments: pleasant and agreeable to PT Following commands: Intact       Cueing Cueing Techniques: Verbal cues     General Comments General comments (skin integrity, edema, etc.): Pt on 5L of O2 upon entry to room. After pt stands pt de satted to 76% and unable to recover SpO2 with PLBing. SPT inc supplemental O2 to 10L before pt able to recover SpO2 to WNL. Desat to 87% after amb on 10L.    Exercises Other Exercises Other Exercises: Edu on use of incentive spirometer   Assessment/Plan    PT Assessment Patient needs continued PT services  PT Problem List Decreased strength;Decreased activity tolerance;Decreased knowledge of use of DME;Cardiopulmonary status limiting activity       PT Treatment Interventions DME instruction;Gait training;Functional mobility training;Therapeutic activities;Therapeutic exercise;Balance training;Neuromuscular re-education;Patient/family education    PT Goals (Current goals can be found in  the Care Plan section)  Acute Rehab PT Goals Patient Stated Goal: to breathe better PT Goal Formulation: With patient Time For Goal Achievement: 09/21/24 Potential to Achieve Goals: Good    Frequency Min 2X/week     Co-evaluation               AM-PAC PT 6 Clicks Mobility  Outcome Measure Help needed turning from your back to your side while in a flat bed without using bedrails?: A Little Help needed moving from lying on your back to sitting on the side of a flat bed without using bedrails?: A Little Help needed moving to and from a bed to a chair (including a wheelchair)?: A Little Help needed standing up from a chair using your arms (e.g., wheelchair or bedside chair)?: A Little Help needed to walk in hospital room?: A Lot Help needed climbing 3-5 steps with a railing? : A Lot 6 Click Score: 16    End of Session Equipment Utilized During Treatment: Gait belt;Oxygen  Activity Tolerance: Patient tolerated treatment well Patient left: in chair;with call bell/phone within reach Nurse Communication: Mobility status PT Visit Diagnosis: Unsteadiness on feet (R26.81);Muscle weakness (generalized) (M62.81)    Time: 9067-9042 PT Time Calculation (min) (ACUTE ONLY): 25 min   Charges:  Wendelyn Kiesling, SPT   Pier Laux 09/07/2024, 10:34 AM

## 2024-09-07 NOTE — Plan of Care (Signed)

## 2024-09-07 NOTE — Progress Notes (Signed)
 Report given to [facility]. Schleduled medications given. Oxycodone  5 mg requested and given. Patient bedding and linens changed. Patient ready to transfer.

## 2024-09-07 NOTE — TOC CM/SW Note (Signed)
 CSW went by room for readmission prevention screen and to talk to patient about SNF. Patient was asleep and did not wake up to CSW calling his name. Will try again later.  Lauraine Carpen, CSW [phone]

## 2024-09-07 NOTE — Evaluation (Signed)
 Occupational Therapy Evaluation Patient Details Name: Austin Patterson MRN: [id number] DOB: July 23, 1956 Today's Date: 09/07/2024   History of Present Illness   Austin Patterson is a 68 y.o. year old male with medical history of HTN, HLD, Stage IV Lung Cancer, Asthma, COPD and paroxymal atrial fibrillation presenting with weakness along with muscles aches, fevers and chills. He also reports coughing with some sputum production. MD dx include: Acute on chronic hypoxic respiratory failure, pulmonary fibrosis, HLD, GERD, and HFpEF.     Clinical Impressions Pt was seen for OT evaluation this date. Prior to hospital admission, pt was mod indep with rollator, living alone with PRN assist from family, using 2-3L O2. Pt on 10L throughout, desats to 87% with ~5' with RW, requiring MIN-MOD A to stand from recliner, CGA-MIN A for steps with RW and for sit>supine. Pt edu in PLB to support breath recovery up to 93-94% on 10L. RN notified. Pt presents with deficits in activity tolerance, balance, strength, and cardiopulmonary status, affecting safe and optimal ADL completion. Pt would benefit from skilled OT services to address noted impairments and functional limitations (see below for any additional details) in order to maximize safety and independence while minimizing future risk of falls, injury, and readmission. Anticipate the need for follow up OT services upon acute hospital DC.     If plan is discharge home, recommend the following:   A little help with walking and/or transfers;A lot of help with bathing/dressing/bathroom;Assistance with cooking/housework;Assist for transportation;Help with stairs or ramp for entrance     Functional Status Assessment   Patient has had a recent decline in their functional status and demonstrates the ability to make significant improvements in function in a reasonable and predictable amount of time.     Equipment Recommendations   Other (comment) (defer)      Recommendations for Other Services         Precautions/Restrictions   Precautions Precautions: Fall Recall of Precautions/Restrictions: Intact Restrictions Weight Bearing Restrictions Per Provider Order: No     Mobility Bed Mobility Overal bed mobility: Needs Assistance Bed Mobility: Sit to Supine       Sit to supine: Contact guard assist   General bed mobility comments: BLE mgt    Transfers Overall transfer level: Needs assistance Equipment used: Rolling walker (2 wheels) Transfers: Sit to/from Stand Sit to Stand: Min assist, Mod assist           General transfer comment: from recliner      Balance Overall balance assessment: Needs assistance Sitting-balance support: No upper extremity supported, Feet supported Sitting balance-Leahy Scale: Good     Standing balance support: During functional activity, Reliant on assistive device for balance Standing balance-Leahy Scale: Fair                             ADL either performed or assessed with clinical judgement   ADL Overall ADL's : Needs assistance/impaired                     Lower Body Dressing: Sit to/from stand;Moderate assistance;Minimal assistance   Toilet Transfer: Ambulation;BSC/3in1;Rolling walker (2 wheels);Minimal assistance Toilet Transfer Details (indicate cue type and reason): simulated                 Vision         Perception         Praxis         Pertinent Vitals/Pain  Pain Assessment Pain Assessment: No/denies pain     Extremity/Trunk Assessment Upper Extremity Assessment Upper Extremity Assessment: Generalized weakness   Lower Extremity Assessment Lower Extremity Assessment: Generalized weakness   Cervical / Trunk Assessment Cervical / Trunk Assessment: Kyphotic   Communication Communication Communication: No apparent difficulties   Cognition Arousal: Alert Behavior During Therapy: WFL for tasks assessed/performed Cognition: No  apparent impairments, No family/caregiver present to determine baseline                               Following commands: Intact       Cueing  General Comments   Cueing Techniques: Verbal cues  On 10L throughout, SpO2 down to 87% improving to 93-94% at rest on 10L   Exercises Other Exercises Other Exercises: Edu in PLB   Shoulder Instructions      Home Living Family/patient expects to be discharged to:: Private residence Living Arrangements: Alone Available Help at Discharge: Family;Available PRN/intermittently Type of Home: House Home Access: Ramped entrance     Home Layout: One level     Bathroom Shower/Tub: Chief Strategy Officer: Standard     Home Equipment: Cane - single point;Rollator (4 wheels);Rolling Walker (2 wheels);Shower seat   Additional Comments: pt reports also having a walking stick      Prior Functioning/Environment Prior Level of Function : Independent/Modified Independent             Mobility Comments: Pt reports using rollator for mobility and no device around the home for short distances. Pt uses 2-3L of O2 at baseline ADLs Comments: IND with basic ADLs    OT Problem List: Decreased strength;Decreased activity tolerance;Impaired balance (sitting and/or standing);Decreased knowledge of use of DME or AE   OT Treatment/Interventions: Therapeutic exercise;Self-care/ADL training;Therapeutic activities;Energy conservation;DME and/or AE instruction;Patient/family education;Balance training      OT Goals(Current goals can be found in the care plan section)   Acute Rehab OT Goals Patient Stated Goal: get better OT Goal Formulation: With patient Time For Goal Achievement: 09/21/24 Potential to Achieve Goals: Good ADL Goals Pt Will Perform Lower Body Dressing: sit to/from stand;with modified independence;sitting/lateral leans;with adaptive equipment Pt Will Transfer to Toilet: ambulating;bedside commode;with  supervision (LRAD) Pt Will Perform Toileting - Clothing Manipulation and hygiene: with modified independence;sitting/lateral leans Additional ADL Goal #1: Pt will utitlize learned ECS to support breathing, 5/5 opportunties during ADL/mobility.   OT Frequency:  Min 2X/week    Co-evaluation              AM-PAC OT 6 Clicks Daily Activity     Outcome Measure Help from another person eating meals?: None Help from another person taking care of personal grooming?: A Little Help from another person toileting, which includes using toliet, bedpan, or urinal?: A Little Help from another person bathing (including washing, rinsing, drying)?: A Little Help from another person to put on and taking off regular upper body clothing?: None Help from another person to put on and taking off regular lower body clothing?: A Little 6 Click Score: 20   End of Session Equipment Utilized During Treatment: Oxygen ;Rolling walker (2 wheels) Nurse Communication: Mobility status  Activity Tolerance: Patient tolerated treatment well Patient left: in bed;with call bell/phone within reach;with bed alarm set  OT Visit Diagnosis: Unsteadiness on feet (R26.81);Muscle weakness (generalized) (M62.81)                Time: 8867-8858 OT Time Calculation (min): 9  min Charges:  OT General Charges $OT Visit: 1 Visit OT Evaluation $OT Eval Low Complexity: 1 Low  Warren SAUNDERS., MPH, MS, OTR/L ascom [phone] 09/07/24, 12:10 PM

## 2024-09-07 NOTE — Progress Notes (Signed)
 PROGRESS NOTE    Austin Patterson  FMW:[id number] DOB: 02/27/1956 DOA: 09/06/2024 PCP: Autry Grayce LABOR, PA   Brief Narrative:   68 y.o. year old male with medical history of HTN, HLD, Stage IV Lung Cancer, Asthma, COPD and paroxymal atrial fibrillation presenting to the ED with weakness along with muscles aches, fevers and chills   11/20: PT, OT eval, DC tele, transfer to med-surg   Assessment & Plan:   Principal Problem:   Acute on chronic hypoxic respiratory failure (HCC) Active Problems:   CAD (coronary artery disease)   Pulmonary fibrosis (HCC)   Essential hypertension   Hyperlipidemia   Asthma-COPD overlap syndrome (HCC)   GERD without esophagitis   Stage IV adenocarcinoma of lung (HCC)   Paroxysmal atrial fibrillation (HCC)   Acute on chronic hypoxic respiratory failure likely multifactorial with community acquired pneumonia and COPD exacerbation.   .continue Rocephin  & Zithromax    Hypertension: Continue Metoprolol    Hyperlipidemia: Continue home statin.   GERD: Continue home PPI   HFpEF: Continue home Lasix .  Patient appears euvolemic on exam.  Last EF 2 months ago was 55%.   Paroxysmal A-fib: In sinus rhythm.   Stage IV adenocarcinoma of lung   [name].  Currently getting gemcitabine .  Goal is palliative in nature.  Patient with previous history of loculated right-sided effusion that was present and discussed on recent visit on 09/04/2024.  GOC - Palliative care c/s   DVT prophylaxis: Lovenox  Place and maintain sequential compression device Start: 09/07/24 1530 enoxaparin  (LOVENOX ) injection 40 mg Start: 09/06/24 2000     Code Status: DNR Family Communication: None at bedside Disposition Plan: possible D/C in 2-3 days depending on clinical condition     Antimicrobials:  Rocephin  Azithromycin     Subjective:  Sitting in the chair, SOB & cough +  Objective: Vitals:   09/07/24 1120 09/07/24 1514 09/07/24 1938  09/07/24 1959  BP: (!) 128/93 116/74  111/62  Pulse: 96 88  (!) 102  Resp: 20 20  20   Temp: 97.6 F (36.4 C) 97.6 F (36.4 C)  97.6 F (36.4 C)  TempSrc:      SpO2: 100% 99% 97% 96%  Weight:      Height:        Intake/Output Summary (Last 24 hours) at 09/07/2024 2026 Last data filed at 09/07/2024 1914 Gross per 24 hour  Intake 1694.89 ml  Output 175 ml  Net 1519.89 ml   Filed Weights   09/06/24 1403  Weight: 78.5 kg    Examination:  General exam: Appears calm and comfortable  Respiratory system: Rhonchi at the LLL bases Cardiovascular system: S1 & S2 heard, RRR. No pedal edema. Gastrointestinal system: Abdomen is soft, benign Central nervous system: Alert and oriented. No focal neurological deficits. Extremities: Symmetric 5 x 5 power. Skin: No rashes, lesions or ulcers Psychiatry: Judgement and insight appear normal. Mood & affect appropriate.     Data Reviewed: I have personally reviewed following labs and imaging studies  CBC: Recent Labs  Lab 09/04/24 1141 09/06/24 1405 09/07/24 0332  WBC 6.2 9.1 7.8  NEUTROABS 5.1 8.7*  --   HGB 10.1* 10.4* 9.2*  HCT 33.1* 34.3* 30.1*  MCV 93.0 95.8 94.1  PLT 240 296 262   Basic Metabolic Panel: Recent Labs  Lab 09/04/24 1141 09/06/24 1405 09/07/24 0332  NA 137 142 142  K 4.2 4.7 4.4  CL 101 104 104  CO2 27 25 27   GLUCOSE 96 122* 141*  BUN 19 25* 23  CREATININE 1.30* 1.29* 1.23  CALCIUM  7.9* 9.2 8.3*  MG  --  1.7  --    GFR: Estimated Creatinine Clearance: 53.7 mL/min (by C-G formula based on SCr of 1.23 mg/dL). Liver Function Tests: Recent Labs  Lab 09/04/24 1141 09/06/24 1405  AST 22 32  ALT 15 15  ALKPHOS 112 134*  BILITOT 0.4 0.4  PROT 6.7 6.6  ALBUMIN 3.5 3.7   No results for input(s): LIPASE, AMYLASE in the last 168 hours. No results for input(s): AMMONIA in the last 168 hours. Coagulation Profile: Recent Labs  Lab 09/06/24 2008  INR 1.1    Sepsis Labs: Recent Labs  Lab  09/06/24 1405 09/06/24 1625  LATICACIDVEN 2.1* 1.1    Recent Results (from the past 240 hours)  Resp panel by RT-PCR (RSV, Flu A&B, Covid) Anterior Nasal Swab     Status: None   Collection Time: 09/06/24  2:37 PM   Specimen: Anterior Nasal Swab  Result Value Ref Range Status   SARS Coronavirus 2 by RT PCR NEGATIVE NEGATIVE Final    Comment: (NOTE) SARS-CoV-2 target nucleic acids are NOT DETECTED.  The SARS-CoV-2 RNA is generally detectable in upper respiratory specimens during the acute phase of infection. The lowest concentration of SARS-CoV-2 viral copies this assay can detect is 138 copies/mL. A negative result does not preclude SARS-Cov-2 infection and should not be used as the sole basis for treatment or other patient management decisions. A negative result may occur with  improper specimen collection/handling, submission of specimen other than nasopharyngeal swab, presence of viral mutation(s) within the areas targeted by this assay, and inadequate number of viral copies(<138 copies/mL). A negative result must be combined with clinical observations, patient history, and epidemiological information. The expected result is Negative.  Fact Sheet for Patients:  bloggercourse.com  Fact Sheet for Healthcare Providers:  seriousbroker.it  This test is no t yet approved or cleared by the [place]  FDA and  has been authorized for detection and/or diagnosis of SARS-CoV-2 by FDA under an Emergency Use Authorization (EUA). This EUA will remain  in effect (meaning this test can be used) for the duration of the COVID-19 declaration under Section 564(b)(1) of the Act, 21 U.S.C.section 360bbb-3(b)(1), unless the authorization is terminated  or revoked sooner.       Influenza A by PCR NEGATIVE NEGATIVE Final   Influenza B by PCR NEGATIVE NEGATIVE Final    Comment: (NOTE) The Xpert Xpress SARS-CoV-2/FLU/RSV plus assay is intended  as an aid in the diagnosis of influenza from Nasopharyngeal swab specimens and should not be used as a sole basis for treatment. Nasal washings and aspirates are unacceptable for Xpert Xpress SARS-CoV-2/FLU/RSV testing.  Fact Sheet for Patients: bloggercourse.com  Fact Sheet for Healthcare Providers: seriousbroker.it  This test is not yet approved or cleared by the [place]  FDA and has been authorized for detection and/or diagnosis of SARS-CoV-2 by FDA under an Emergency Use Authorization (EUA). This EUA will remain in effect (meaning this test can be used) for the duration of the COVID-19 declaration under Section 564(b)(1) of the Act, 21 U.S.C. section 360bbb-3(b)(1), unless the authorization is terminated or revoked.     Resp Syncytial Virus by PCR NEGATIVE NEGATIVE Final    Comment: (NOTE) Fact Sheet for Patients: bloggercourse.com  Fact Sheet for Healthcare Providers: seriousbroker.it  This test is not yet approved or cleared by the [place]  FDA and has been authorized for detection and/or diagnosis of SARS-CoV-2 by FDA  under an Emergency Use Authorization (EUA). This EUA will remain in effect (meaning this test can be used) for the duration of the COVID-19 declaration under Section 564(b)(1) of the Act, 21 U.S.C. section 360bbb-3(b)(1), unless the authorization is terminated or revoked.  Performed at [facility], [address] [place], [place] [address]   Blood Culture (routine x 2)     Status: None (Preliminary result)   Collection Time: 09/06/24  2:38 PM   Specimen: BLOOD  Result Value Ref Range Status   Specimen Description BLOOD BLOOD RIGHT WRIST  Final   Special Requests   Final    BOTTLES DRAWN AEROBIC AND ANAEROBIC Blood Culture results may not be optimal due to an inadequate volume of blood received in culture bottles   Culture   Final     NO GROWTH < 12 HOURS Performed at [facility], [address], [place], [place] [address]    Report Status PENDING  Incomplete  Blood Culture (routine x 2)     Status: None (Preliminary result)   Collection Time: 09/06/24  2:43 PM   Specimen: BLOOD  Result Value Ref Range Status   Specimen Description BLOOD LEFT HAND  Final   Special Requests   Final    BOTTLES DRAWN AEROBIC AND ANAEROBIC Blood Culture results may not be optimal due to an inadequate volume of blood received in culture bottles   Culture   Final    NO GROWTH < 12 HOURS Performed at [facility], [address], [place], [place] [address]    Report Status PENDING  Incomplete         Radiology Studies: CT CHEST WO CONTRAST Result Date: 09/06/2024 CLINICAL DATA:  Pneumonia.  History of lung cancer. EXAM: CT CHEST WITHOUT CONTRAST TECHNIQUE: Multidetector CT imaging of the chest was performed following the standard protocol without IV contrast. RADIATION DOSE REDUCTION: This exam was performed according to the departmental dose-optimization program which includes automated exposure control, adjustment of the mA and/or kV according to patient size and/or use of iterative reconstruction technique. COMPARISON:  Chest x-ray same day.  CT angiogram chest 07/13/2024. FINDINGS: Cardiovascular: There is a trace pericardial effusion, unchanged. Heart is mildly enlarged. Aorta is normal in size. There are atherosclerotic calcifications of the aorta and coronary arteries. Left chest port catheter tip ends in the SVC. Mediastinum/Nodes: No enlarged mediastinal or axillary lymph nodes. Thyroid  gland, trachea, and esophagus demonstrate no significant findings. Lungs/Pleura: Left pleural effusion has nearly completely resolved. Small to moderate-sized right pleural effusion is unchanged, partially loculated. Severe emphysema again seen with large bulla in the right lower hemithorax. There are numerous new bilateral  pulmonary nodules, more significant surrounding the lung lung measuring up to 14 mm. There is a new nodular mass in the right lung base measuring 2.6 cm image 3/110. Masslike density in the anterior right lower hemithorax extending to the hilum appears unchanged. There is no new focal lung infiltrate. Upper Abdomen: No acute abnormality. Musculoskeletal: There are vertebroplasty changes are similar to prior. No new fractures are identified. IMPRESSION: 1. New bilateral pulmonary nodules worrisome for metastatic disease. 2. Stable masslike density in the anterior right lower hemithorax extending to the hilum. 3. Stable small to moderate-sized right pleural effusion, partially loculated. 4. Near complete resolution of left pleural effusion. 5. Stable trace pericardial effusion. Aortic Atherosclerosis (ICD10-I70.0) and Emphysema (ICD10-J43.9). Electronically Signed   By: Greig Pique M.D.   On: 09/06/2024 19:46   DG Chest [facility] 1 View Result  Date: 09/06/2024 EXAM: 1 VIEW(S) XRAY OF THE CHEST 09/06/2024 02:55:00 PM COMPARISON: 08/23/2024 CLINICAL HISTORY: Oxygen  dependent [id number] FINDINGS: LINES, TUBES AND DEVICES: Left chest port with tip at SVC. LUNGS AND PLEURA: The known right middle lobe and medial left lung base lesions are suboptimally visualized on the current radiograph. Again seen is diffuse interstitial prominence with progressive patchy nodular densities in the left base, which may reflect aspiration or pneumonia. Small to moderate right pleural effusion is slightly increased in the interval. No pneumothorax. HEART AND MEDIASTINUM: No acute abnormality of the cardiac and mediastinal silhouettes. BONES AND SOFT TISSUES: No acute osseous abnormality. IMPRESSION: 1. Diffuse interstitial prominence with progressive patchy nodular densities in the left lung base, possibly reflecting aspiration or pneumonia. 2. Small to moderate right pleural effusion, slightly increased in the interval. Electronically signed  by: Waddell Calk MD 09/06/2024 03:48 PM EST RP Workstation: [id number]        Scheduled Meds:  aspirin  EC  81 mg Oral Daily   atorvastatin   40 mg Oral QHS   calcium -vitamin D   1 tablet Oral BID   clopidogrel   75 mg Oral Daily   enoxaparin  (LOVENOX ) injection  40 mg Subcutaneous Q24H   gabapentin   300 mg Oral TID   ipratropium-albuterol   3 mL Nebulization Q6H   magnesium  oxide  400 mg Oral Daily   metoprolol  succinate  50 mg Oral Daily   montelukast   10 mg Oral Daily   pantoprazole   40 mg Oral BID   predniSONE   40 mg Oral Q breakfast   sodium chloride  flush  3 mL Intravenous Q12H   Continuous Infusions:  azithromycin  Stopped (09/07/24 0125)   cefTRIAXone  (ROCEPHIN )  IV Stopped (09/07/24 0021)     LOS: 1 day    Time spent: 35 mins    Ninette Cotta Maree, MD [facility] Pager [phone]  If 7PM-7AM, please contact night-coverage [email or web address]  09/07/2024, 8:26 PM

## 2024-09-08 DIAGNOSIS — K219 Gastro-esophageal reflux disease without esophagitis: Secondary | ICD-10-CM | POA: Diagnosis not present

## 2024-09-08 DIAGNOSIS — I2510 Atherosclerotic heart disease of native coronary artery without angina pectoris: Secondary | ICD-10-CM | POA: Diagnosis not present

## 2024-09-08 DIAGNOSIS — J9621 Acute and chronic respiratory failure with hypoxia: Secondary | ICD-10-CM | POA: Diagnosis not present

## 2024-09-08 DIAGNOSIS — I10 Essential (primary) hypertension: Secondary | ICD-10-CM | POA: Diagnosis not present

## 2024-09-08 LAB — CBC
HCT: 29.4 % — ABNORMAL LOW (ref 39.0–52.0)
Hemoglobin: 8.5 g/dL — ABNORMAL LOW (ref 13.0–17.0)
MCH: 28.4 pg (ref 26.0–34.0)
MCHC: 28.9 g/dL — ABNORMAL LOW (ref 30.0–36.0)
MCV: 98.3 fL (ref 80.0–100.0)
Platelets: 259 K/uL (ref 150–400)
RBC: 2.99 MIL/uL — ABNORMAL LOW (ref 4.22–5.81)
RDW: 17.6 % — ABNORMAL HIGH (ref 11.5–15.5)
WBC: 7.6 K/uL (ref 4.0–10.5)
nRBC: 0 % (ref 0.0–0.2)

## 2024-09-08 LAB — GLUCOSE, CAPILLARY
Glucose-Capillary: 89 mg/dL (ref 70–99)
Glucose-Capillary: 168 mg/dL — ABNORMAL HIGH (ref 70–99)

## 2024-09-08 LAB — BASIC METABOLIC PANEL WITH GFR
Anion gap: 6 (ref 5–15)
BUN: 26 mg/dL — ABNORMAL HIGH (ref 8–23)
CO2: 31 mmol/L (ref 22–32)
Calcium: 8 mg/dL — ABNORMAL LOW (ref 8.9–10.3)
Chloride: 103 mmol/L (ref 98–111)
Creatinine, Ser: 1.08 mg/dL (ref 0.61–1.24)
GFR, Estimated: >60 mL/min (ref >60)
Glucose, Bld: 116 mg/dL — ABNORMAL HIGH (ref 70–99)
Potassium: 4.1 mmol/L (ref 3.5–5.1)
Sodium: 139 mmol/L (ref 135–145)

## 2024-09-08 MED ORDER — SALINE SPRAY 0.65 % NA SOLN
1.0000 | NASAL | Status: DC | PRN
  Administered 2024-09-08: 1 via NASAL
  Filled 2024-09-08: qty 44

## 2024-09-08 MED ORDER — FUROSEMIDE 10 MG/ML IJ SOLN
80.0000 mg | Freq: Once | INTRAMUSCULAR | Status: AC
  Administered 2024-09-08: 80 mg via INTRAVENOUS
  Filled 2024-09-08: qty 8

## 2024-09-08 MED ORDER — GUAIFENESIN-DM 100-10 MG/5ML PO SYRP
5.0000 mL | ORAL_SOLUTION | ORAL | Status: DC | PRN
  Administered 2024-09-08 – 2024-09-12 (×4): 5 mL via ORAL
  Filled 2024-09-08 (×4): qty 10

## 2024-09-08 NOTE — Progress Notes (Signed)
 Physical Therapy Treatment Patient Details Name: Austin Patterson MRN: [id number] DOB: Oct 11, 1956 Today's Date: 09/08/2024   History of Present Illness Austin Patterson is a 68 y.o. year old male with medical history of HTN, HLD, Stage IV Lung Cancer, Asthma, COPD and paroxymal atrial fibrillation presenting with weakness along with muscles aches, fevers and chills. He also reports coughing with some sputum production. MD dx include: Acute on chronic hypoxic respiratory failure, pulmonary fibrosis, HLD, GERD, and HFpEF.    PT Comments  PT focus this session on activity tolerance. Pt on 8L of O2 to begin session and desatted to 86% while sitting at EOB. With increased exertion, pt required inc of O2 to 11L to maintain SpO2 in 90s. Pt demosntrates dec balance when given HHA for side stepping at EOB requiring min A to maintain balance. Pt experienced 1 LOB requiring mod A to correct. Once seated and at rest, SPT weaned pt to 6L of O2 to for pt to maintain SpO2 to 90s. Pt would benefit from use of AD to decrease risk of falls. Will continue to follow pt acutely to promote optimal LOF and safety with performing functional tasks.     If plan is discharge home, recommend the following: A little help with walking and/or transfers;A little help with bathing/dressing/bathroom;Assistance with cooking/housework;Help with stairs or ramp for entrance   Can travel by private vehicle     No  Equipment Recommendations  None recommended by PT (Pt has needed DME at home)    Recommendations for Other Services       Precautions / Restrictions Precautions Precautions: Fall Recall of Precautions/Restrictions: Intact Restrictions Weight Bearing Restrictions Per Provider Order: No     Mobility  Bed Mobility               General bed mobility comments: NT. Pt received sitting at EOB.    Transfers Overall transfer level: Needs assistance Equipment used: Rolling walker (2 wheels) Transfers: Sit  to/from Stand, Bed to chair/wheelchair/BSC Sit to Stand: Min assist   Step pivot transfers: Min assist       General transfer comment: Min A for step pivot transfer to maintain balance. Continues to demonstrate posterior bias upon standing    Ambulation/Gait Ambulation/Gait assistance: Min assist, Mod assist Gait Distance (Feet): 6 Feet Assistive device: 1 person hand held assist         General Gait Details: Side steps at EOB. Pt limited by cardiopulmonary status and desatted to low 86% while on 10L of O2. Pt required mod A to correct LOB. Pt very unsteady without AD.   Stairs             Wheelchair Mobility     Tilt Bed    Modified Rankin (Stroke Patients Only)       Balance Overall balance assessment: Needs assistance Sitting-balance support: No upper extremity supported, Feet supported Sitting balance-Leahy Scale: Good Sitting balance - Comments: Able to maintain sitting balance at EOB. Fatigued at one point requiring him to lean back.   Standing balance support: Single extremity supported, During functional activity Standing balance-Leahy Scale: Poor Standing balance comment: Pt requires AD to inc BOS and support balance. Inc postural sway when given HHA.                            Communication Communication Communication: No apparent difficulties  Cognition Arousal: Alert Behavior During Therapy: WFL for tasks assessed/performed   PT -  Cognitive impairments: No apparent impairments                       PT - Cognition Comments: pleasant and agreeable to PT Following commands: Intact      Cueing Cueing Techniques: Verbal cues  Exercises Other Exercises Other Exercises: Once at rest, pt able to wean SpO2 down to 6L with SpO2 WNL.    General Comments General comments (skin integrity, edema, etc.): Pt on 8L of O2 at beginning of session with desat to 86% once standing. Pt unable to recover despite PLBing, so inc in O2 to 12L  required for inc of SpO2 back to 90s.      Pertinent Vitals/Pain Pain Assessment Pain Assessment: No/denies pain    Home Living                          Prior Function            PT Goals (current goals can now be found in the care plan section) Acute Rehab PT Goals PT Goal Formulation: With patient Time For Goal Achievement: 09/21/24 Potential to Achieve Goals: Good Progress towards PT goals: Progressing toward goals    Frequency    Min 2X/week      PT Plan      Co-evaluation              AM-PAC PT 6 Clicks Mobility   Outcome Measure  Help needed turning from your back to your side while in a flat bed without using bedrails?: A Little Help needed moving from lying on your back to sitting on the side of a flat bed without using bedrails?: A Little Help needed moving to and from a bed to a chair (including a wheelchair)?: A Little Help needed standing up from a chair using your arms (e.g., wheelchair or bedside chair)?: A Little Help needed to walk in hospital room?: A Lot Help needed climbing 3-5 steps with a railing? : A Lot 6 Click Score: 16    End of Session Equipment Utilized During Treatment: Oxygen  Activity Tolerance: Patient tolerated treatment well Patient left: in chair;with call bell/phone within reach;with chair alarm set Nurse Communication: Mobility status PT Visit Diagnosis: Unsteadiness on feet (R26.81);Muscle weakness (generalized) (M62.81)     Time: 8641-8577 PT Time Calculation (min) (ACUTE ONLY): 24 min  Charges:                            Leelynd Maldonado, SPT    Jyren Cerasoli 09/08/2024, 4:11 PM

## 2024-09-08 NOTE — Progress Notes (Signed)
 PROGRESS NOTE    Austin Patterson  FMW:[id number] DOB: May 30, 1956 DOA: 09/06/2024 PCP: Autry Grayce LABOR, PA   Brief Narrative:   68 y.o. year old male with medical history of HTN, HLD, Stage IV Lung Cancer, Asthma, COPD and paroxymal atrial fibrillation presenting to the ED with weakness along with muscles aches, fevers and chills   11/20: PT, OT eval, DC tele, transfer to med-surg 11/21: Lasix  80 mg IV once   Assessment & Plan:   Principal Problem:   Acute on chronic hypoxic respiratory failure (HCC) Active Problems:   CAD (coronary artery disease)   Pulmonary fibrosis (HCC)   Essential hypertension   Hyperlipidemia   Asthma-COPD overlap syndrome (HCC)   GERD without esophagitis   Stage IV adenocarcinoma of lung (HCC)   Paroxysmal atrial fibrillation (HCC)   Acute on chronic hypoxic respiratory failure likely multifactorial with community acquired pneumonia and COPD exacerbation.   .continue Rocephin  & Zithromax . Continue to wean O2 - down to 4 liters now. At baseline uses 2-3 liters   Hypertension: Continue Metoprolol    Hyperlipidemia: Continue home statin.   GERD: Continue home PPI   HFpEF: Lasix  80 mg IV once.  Patient appears euvolemic on exam.  Last EF 2 months ago was 55%.   Paroxysmal A-fib: In sinus rhythm.   Stage IV adenocarcinoma of lung   [name].  Currently getting gemcitabine .  Goal is palliative in nature.  Patient with previous history of loculated right-sided effusion that was present and discussed on recent visit on 09/04/2024.  GOC - Palliative care c/s pending   DVT prophylaxis: Lovenox  Place and maintain sequential compression device Start: 09/07/24 1530 enoxaparin  (LOVENOX ) injection 40 mg Start: 09/06/24 2000     Code Status: DNR Family Communication: None at bedside Disposition Plan: possible D/C in 1-2 days depending on clinical condition     Antimicrobials:  Rocephin  Azithromycin      Subjective:  SOB & cough improving. Needing less O2   Objective: Vitals:   09/08/24 0500 09/08/24 0805 09/08/24 1428 09/08/24 1802  BP:  (!) 142/82 121/68   Pulse:  (!) 106 (!) 110 (!) 102  Resp:  16 19   Temp:  98.6 F (37 C) 98.2 F (36.8 C)   TempSrc:      SpO2:  95% 96% 98%  Weight: 85.9 kg     Height:        Intake/Output Summary (Last 24 hours) at 09/08/2024 1804 Last data filed at 09/08/2024 1547 Gross per 24 hour  Intake 1200 ml  Output 500 ml  Net 700 ml   Filed Weights   09/06/24 1403 09/08/24 0500  Weight: 78.5 kg 85.9 kg    Examination:  General exam: Appears calm and comfortable  Respiratory system: Rhonchi at the LLL bases Cardiovascular system: S1 & S2 heard, RRR. No pedal edema. Gastrointestinal system: Abdomen is soft, benign Central nervous system: Alert and oriented. No focal neurological deficits. Extremities: Symmetric 5 x 5 power. Skin: No rashes, lesions or ulcers Psychiatry: Judgement and insight appear normal. Mood & affect appropriate.     Data Reviewed: I have personally reviewed following labs and imaging studies  CBC: Recent Labs  Lab 09/04/24 1141 09/06/24 1405 09/07/24 0332 09/08/24 0449  WBC 6.2 9.1 7.8 7.6  NEUTROABS 5.1 8.7*  --   --   HGB 10.1* 10.4* 9.2* 8.5*  HCT 33.1* 34.3* 30.1* 29.4*  MCV 93.0 95.8 94.1 98.3  PLT 240 296 262 259  Basic Metabolic Panel: Recent Labs  Lab 09/04/24 1141 09/06/24 1405 09/07/24 0332 09/08/24 0449  NA 137 142 142 139  K 4.2 4.7 4.4 4.1  CL 101 104 104 103  CO2 27 25 27 31   GLUCOSE 96 122* 141* 116*  BUN 19 25* 23 26*  CREATININE 1.30* 1.29* 1.23 1.08  CALCIUM  7.9* 9.2 8.3* 8.0*  MG  --  1.7  --   --    GFR: Estimated Creatinine Clearance: 68.5 mL/min (by C-G formula based on SCr of 1.08 mg/dL). Liver Function Tests: Recent Labs  Lab 09/04/24 1141 09/06/24 1405  AST 22 32  ALT 15 15  ALKPHOS 112 134*  BILITOT 0.4 0.4  PROT 6.7 6.6  ALBUMIN 3.5 3.7   No  results for input(s): LIPASE, AMYLASE in the last 168 hours. No results for input(s): AMMONIA in the last 168 hours. Coagulation Profile: Recent Labs  Lab 09/06/24 2008  INR 1.1    Sepsis Labs: Recent Labs  Lab 09/06/24 1405 09/06/24 1625  LATICACIDVEN 2.1* 1.1    Recent Results (from the past 240 hours)  Resp panel by RT-PCR (RSV, Flu A&B, Covid) Anterior Nasal Swab     Status: None   Collection Time: 09/06/24  2:37 PM   Specimen: Anterior Nasal Swab  Result Value Ref Range Status   SARS Coronavirus 2 by RT PCR NEGATIVE NEGATIVE Final    Comment: (NOTE) SARS-CoV-2 target nucleic acids are NOT DETECTED.  The SARS-CoV-2 RNA is generally detectable in upper respiratory specimens during the acute phase of infection. The lowest concentration of SARS-CoV-2 viral copies this assay can detect is 138 copies/mL. A negative result does not preclude SARS-Cov-2 infection and should not be used as the sole basis for treatment or other patient management decisions. A negative result may occur with  improper specimen collection/handling, submission of specimen other than nasopharyngeal swab, presence of viral mutation(s) within the areas targeted by this assay, and inadequate number of viral copies(<138 copies/mL). A negative result must be combined with clinical observations, patient history, and epidemiological information. The expected result is Negative.  Fact Sheet for Patients:  bloggercourse.com  Fact Sheet for Healthcare Providers:  seriousbroker.it  This test is no t yet approved or cleared by the [place]  FDA and  has been authorized for detection and/or diagnosis of SARS-CoV-2 by FDA under an Emergency Use Authorization (EUA). This EUA will remain  in effect (meaning this test can be used) for the duration of the COVID-19 declaration under Section 564(b)(1) of the Act, 21 U.S.C.section 360bbb-3(b)(1), unless the  authorization is terminated  or revoked sooner.       Influenza A by PCR NEGATIVE NEGATIVE Final   Influenza B by PCR NEGATIVE NEGATIVE Final    Comment: (NOTE) The Xpert Xpress SARS-CoV-2/FLU/RSV plus assay is intended as an aid in the diagnosis of influenza from Nasopharyngeal swab specimens and should not be used as a sole basis for treatment. Nasal washings and aspirates are unacceptable for Xpert Xpress SARS-CoV-2/FLU/RSV testing.  Fact Sheet for Patients: bloggercourse.com  Fact Sheet for Healthcare Providers: seriousbroker.it  This test is not yet approved or cleared by the [place]  FDA and has been authorized for detection and/or diagnosis of SARS-CoV-2 by FDA under an Emergency Use Authorization (EUA). This EUA will remain in effect (meaning this test can be used) for the duration of the COVID-19 declaration under Section 564(b)(1) of the Act, 21 U.S.C. section 360bbb-3(b)(1), unless the authorization is terminated or revoked.  Resp Syncytial Virus by PCR NEGATIVE NEGATIVE Final    Comment: (NOTE) Fact Sheet for Patients: bloggercourse.com  Fact Sheet for Healthcare Providers: seriousbroker.it  This test is not yet approved or cleared by the [place]  FDA and has been authorized for detection and/or diagnosis of SARS-CoV-2 by FDA under an Emergency Use Authorization (EUA). This EUA will remain in effect (meaning this test can be used) for the duration of the COVID-19 declaration under Section 564(b)(1) of the Act, 21 U.S.C. section 360bbb-3(b)(1), unless the authorization is terminated or revoked.  Performed at [facility], [address] [place], [place] [address]   Blood Culture (routine x 2)     Status: None (Preliminary result)   Collection Time: 09/06/24  2:38 PM   Specimen: BLOOD  Result Value Ref Range Status   Specimen  Description BLOOD BLOOD RIGHT WRIST  Final   Special Requests   Final    BOTTLES DRAWN AEROBIC AND ANAEROBIC Blood Culture results may not be optimal due to an inadequate volume of blood received in culture bottles   Culture   Final    NO GROWTH 2 DAYS Performed at [facility], [address] [place], [place] [address]    Report Status PENDING  Incomplete  Blood Culture (routine x 2)     Status: None (Preliminary result)   Collection Time: 09/06/24  2:43 PM   Specimen: BLOOD  Result Value Ref Range Status   Specimen Description BLOOD LEFT HAND  Final   Special Requests   Final    BOTTLES DRAWN AEROBIC AND ANAEROBIC Blood Culture results may not be optimal due to an inadequate volume of blood received in culture bottles   Culture   Final    NO GROWTH 2 DAYS Performed at [facility], [address], [place], [place] [address]    Report Status PENDING  Incomplete         Radiology Studies: CT CHEST WO CONTRAST Result Date: 09/06/2024 CLINICAL DATA:  Pneumonia.  History of lung cancer. EXAM: CT CHEST WITHOUT CONTRAST TECHNIQUE: Multidetector CT imaging of the chest was performed following the standard protocol without IV contrast. RADIATION DOSE REDUCTION: This exam was performed according to the departmental dose-optimization program which includes automated exposure control, adjustment of the mA and/or kV according to patient size and/or use of iterative reconstruction technique. COMPARISON:  Chest x-ray same day.  CT angiogram chest 07/13/2024. FINDINGS: Cardiovascular: There is a trace pericardial effusion, unchanged. Heart is mildly enlarged. Aorta is normal in size. There are atherosclerotic calcifications of the aorta and coronary arteries. Left chest port catheter tip ends in the SVC. Mediastinum/Nodes: No enlarged mediastinal or axillary lymph nodes. Thyroid  gland, trachea, and esophagus demonstrate no significant findings. Lungs/Pleura: Left pleural effusion  has nearly completely resolved. Small to moderate-sized right pleural effusion is unchanged, partially loculated. Severe emphysema again seen with large bulla in the right lower hemithorax. There are numerous new bilateral pulmonary nodules, more significant surrounding the lung lung measuring up to 14 mm. There is a new nodular mass in the right lung base measuring 2.6 cm image 3/110. Masslike density in the anterior right lower hemithorax extending to the hilum appears unchanged. There is no new focal lung infiltrate. Upper Abdomen: No acute abnormality. Musculoskeletal: There are vertebroplasty changes are similar to prior. No new fractures are identified. IMPRESSION: 1. New bilateral pulmonary nodules worrisome for metastatic disease. 2. Stable masslike density in the anterior right lower hemithorax extending to the hilum. 3. Stable  small to moderate-sized right pleural effusion, partially loculated. 4. Near complete resolution of left pleural effusion. 5. Stable trace pericardial effusion. Aortic Atherosclerosis (ICD10-I70.0) and Emphysema (ICD10-J43.9). Electronically Signed   By: Greig Pique M.D.   On: 09/06/2024 19:46        Scheduled Meds:  aspirin  EC  81 mg Oral Daily   atorvastatin   40 mg Oral QHS   calcium -vitamin D   1 tablet Oral BID   clopidogrel   75 mg Oral Daily   enoxaparin  (LOVENOX ) injection  40 mg Subcutaneous Q24H   gabapentin   300 mg Oral TID   ipratropium-albuterol   3 mL Nebulization Q6H   magnesium  oxide  400 mg Oral Daily   metoprolol  succinate  50 mg Oral Daily   montelukast   10 mg Oral Daily   pantoprazole   40 mg Oral BID   predniSONE   40 mg Oral Q breakfast   sodium chloride  flush  3 mL Intravenous Q12H   Continuous Infusions:  azithromycin  500 mg (09/08/24 0113)   cefTRIAXone  (ROCEPHIN )  IV 2 g (09/07/24 2353)     LOS: 2 days    Time spent: 35 mins    Achille Xiang Maree, MD [facility] Pager [phone]  If 7PM-7AM, please contact  night-coverage [email or web address]  09/08/2024, 6:04 PM

## 2024-09-08 NOTE — NC FL2 (Signed)
 [place]  MEDICAID FL2 LEVEL OF CARE FORM     IDENTIFICATION  Patient Name: Austin Patterson Birthdate: [birth year] Sex: male Admission Date (Current Location): 09/06/2024  [facility] and [place] Number:  Chiropodist and Address:  [facility], [address] [place], [place] [address]      Provider Number: [id number]  Attending Physician Name and Address:  Maree Hue, MD  Relative Name and Phone Number:       Current Level of Care: Hospital Recommended Level of Care: Skilled Nursing Facility Prior Approval Number:    Date Approved/Denied:   PASRR Number: [id number]  Discharge Plan: SNF    Current Diagnoses: Patient Active Problem List   Diagnosis Date Noted   Acute on chronic hypoxic respiratory failure (HCC) 09/06/2024   Acute hypoxemic respiratory failure (HCC) 07/13/2024   Pleural effusion 06/20/2024   Thrombocytopenia 03/23/2024   Anemia due to antineoplastic chemotherapy 01/27/2024   Encounter for antineoplastic chemotherapy 01/06/2024   Hemoptysis 12/29/2023   Hypocalcemia 09/06/2023   Overweight (BMI 25.0-29.9) 08/08/2023   SIRS (systemic inflammatory response syndrome) (HCC) 08/06/2023   Cancer related pain 07/05/2023   Paroxysmal atrial fibrillation (HCC) 06/16/2023   Sepsis (HCC) 06/12/2023   Macrocytosis 06/08/2023   Stage IV adenocarcinoma of lung (HCC) 05/18/2023   Swelling of right foot 05/11/2023   Heterozygous alpha 1-antitrypsin deficiency (HCC) 04/27/2023   Cough 04/03/2023   Alcohol use 04/03/2023   Constipation 12/10/2022   COPD exacerbation (HCC) 02/23/2022   GERD without esophagitis 02/23/2022   Alpha-1-antitrypsin deficiency carrier 01/10/2021   History of nonmelanoma skin cancer 12/19/2020   Asthma 03/12/2020   Chronic respiratory failure with hypoxia (HCC) 03/12/2020   Hyperlipidemia    Asthma-COPD overlap syndrome (HCC)    Bleeding nose    Alcohol abuse    RUQ abdominal pain    Anaphylaxis  11/01/2019   Hypotension 11/01/2019   CAD (coronary artery disease) 11/01/2019   H/O heart artery stent 11/01/2019   Acute on chronic respiratory failure with hypoxia (HCC)    RUQ pain    Essential hypertension    Pulmonary fibrosis (HCC) 08/30/2019   Hypomagnesemia 03/31/2017   Hiatal hernia    Reflux esophagitis    Hematemesis without nausea    Hyponatremia 09/21/2016   Multifocal pneumonia 12/09/2015    Orientation RESPIRATION BLADDER Height & Weight     Self, Time, Situation, Place  O2 (8 L) Continent Weight: 189 lb 4.8 oz (85.9 kg) Height:  5' 7 (170.2 cm)  BEHAVIORAL SYMPTOMS/MOOD NEUROLOGICAL BOWEL NUTRITION STATUS      Continent Diet  AMBULATORY STATUS COMMUNICATION OF NEEDS Skin   Limited Assist Verbally Normal                       Personal Care Assistance Level of Assistance  Bathing, Feeding, Dressing Bathing Assistance: Limited assistance Feeding assistance: Independent Dressing Assistance: Limited assistance     Functional Limitations Info  Sight, Hearing, Speech Sight Info: Impaired Hearing Info: Adequate Speech Info: Adequate    SPECIAL CARE FACTORS FREQUENCY  PT (By licensed PT), OT (By licensed OT)     PT Frequency: 5x/week OT Frequency: 5x/week            Contractures      Additional Factors Info  Code Status, Allergies Code Status Info: Full Allergies Info: Amoxicillin , Tizanidine            Current Medications (09/08/2024):  This is the current hospital active medication list Current  Facility-Administered Medications  Medication Dose Route Frequency Provider Last Rate Last Admin   acetaminophen  (TYLENOL ) tablet 650 mg  650 mg Oral Q6H PRN Khan, Ghalib, MD   650 mg at 09/08/24 0759   Or   acetaminophen  (TYLENOL ) suppository 650 mg  650 mg Rectal Q6H PRN Khan, Ghalib, MD       aspirin  EC tablet 81 mg  81 mg Oral Daily Khan, Ghalib, MD   81 mg at 09/08/24 1035   atorvastatin  (LIPITOR) tablet 40 mg  40 mg Oral QHS Khan,  Ghalib, MD   40 mg at 09/07/24 2124   azithromycin  (ZITHROMAX ) 500 mg in sodium chloride  0.9 % 250 mL IVPB  500 mg Intravenous Q24H Fernand Prost, MD 250 mL/hr at 09/08/24 0113 500 mg at 09/08/24 0113   calcium -vitamin D  (OSCAL WITH D) 500-5 MG-MCG per tablet 1 tablet  1 tablet Oral BID Fernand Prost, MD   1 tablet at 09/08/24 1035   cefTRIAXone  (ROCEPHIN ) 2 g in sodium chloride  0.9 % 100 mL IVPB  2 g Intravenous Q24H Khan, Ghalib, MD 200 mL/hr at 09/07/24 2353 2 g at 09/07/24 2353   clopidogrel  (PLAVIX ) tablet 75 mg  75 mg Oral Daily Khan, Ghalib, MD   75 mg at 09/08/24 1035   enoxaparin  (LOVENOX ) injection 40 mg  40 mg Subcutaneous Q24H Khan, Ghalib, MD   40 mg at 09/07/24 2123   gabapentin  (NEURONTIN ) capsule 300 mg  300 mg Oral TID Khan, Ghalib, MD   300 mg at 09/08/24 1035   guaiFENesin -dextromethorphan  (ROBITUSSIN DM) 100-10 MG/5ML syrup 5 mL  5 mL Oral Q4H PRN Maree Hue, MD   5 mL at 09/08/24 1239   ipratropium-albuterol  (DUONEB) 0.5-2.5 (3) MG/3ML nebulizer solution 3 mL  3 mL Nebulization Q6H Khan, Ghalib, MD   3 mL at 09/08/24 0800   magnesium  oxide (MAG-OX) tablet 400 mg  400 mg Oral Daily Khan, Ghalib, MD   400 mg at 09/08/24 1035   metoprolol  succinate (TOPROL -XL) 24 hr tablet 50 mg  50 mg Oral Daily Khan, Ghalib, MD   50 mg at 09/08/24 1035   montelukast  (SINGULAIR ) tablet 10 mg  10 mg Oral Daily Khan, Ghalib, MD   10 mg at 09/08/24 1035   Oral care mouth rinse  15 mL Mouth Rinse PRN Fernand Prost, MD       oxyCODONE  (Oxy IR/ROXICODONE ) immediate release tablet 5 mg  5 mg Oral Q6H PRN Shah, Vipul, MD   5 mg at 09/07/24 2127   pantoprazole  (PROTONIX ) EC tablet 40 mg  40 mg Oral BID Khan, Ghalib, MD   40 mg at 09/08/24 1035   predniSONE  (DELTASONE ) tablet 40 mg  40 mg Oral Q breakfast Khan, Ghalib, MD   40 mg at 09/08/24 0800   senna-docusate (Senokot-S) tablet 1 tablet  1 tablet Oral QHS PRN Fernand Prost, MD       sodium chloride  (OCEAN) 0.65 % nasal spray 1 spray  1 spray Each Nare PRN  Maree Hue, MD   1 spray at 09/08/24 0144   sodium chloride  flush (NS) 0.9 % injection 3 mL  3 mL Intravenous Q12H Khan, Ghalib, MD   3 mL at 09/08/24 1036   Facility-Administered Medications Ordered in Other Encounters  Medication Dose Route Frequency Provider Last Rate Last Admin   heparin  lock flush 100 unit/mL  500 Units Intravenous Once Yu, Zhou, MD         Discharge Medications: Please see discharge summary for a list of discharge  medications.  Relevant Imaging Results:  Relevant Lab Results:   Additional Information SSN: [id number]  Alvaro Louder, LCSW

## 2024-09-08 NOTE — Plan of Care (Signed)

## 2024-09-09 ENCOUNTER — Other Ambulatory Visit: Payer: Self-pay | Admitting: Pulmonary Disease

## 2024-09-09 DIAGNOSIS — J9601 Acute respiratory failure with hypoxia: Secondary | ICD-10-CM | POA: Diagnosis not present

## 2024-09-09 DIAGNOSIS — Z515 Encounter for palliative care: Secondary | ICD-10-CM

## 2024-09-09 DIAGNOSIS — C3490 Malignant neoplasm of unspecified part of unspecified bronchus or lung: Secondary | ICD-10-CM

## 2024-09-09 DIAGNOSIS — J9621 Acute and chronic respiratory failure with hypoxia: Secondary | ICD-10-CM | POA: Diagnosis not present

## 2024-09-09 DIAGNOSIS — A419 Sepsis, unspecified organism: Secondary | ICD-10-CM | POA: Diagnosis not present

## 2024-09-09 DIAGNOSIS — J9611 Chronic respiratory failure with hypoxia: Secondary | ICD-10-CM

## 2024-09-09 DIAGNOSIS — J4550 Severe persistent asthma, uncomplicated: Secondary | ICD-10-CM

## 2024-09-09 DIAGNOSIS — C3491 Malignant neoplasm of unspecified part of right bronchus or lung: Secondary | ICD-10-CM | POA: Diagnosis not present

## 2024-09-09 DIAGNOSIS — I10 Essential (primary) hypertension: Secondary | ICD-10-CM | POA: Diagnosis not present

## 2024-09-09 DIAGNOSIS — I2510 Atherosclerotic heart disease of native coronary artery without angina pectoris: Secondary | ICD-10-CM | POA: Diagnosis not present

## 2024-09-09 DIAGNOSIS — Z7189 Other specified counseling: Secondary | ICD-10-CM

## 2024-09-09 DIAGNOSIS — J449 Chronic obstructive pulmonary disease, unspecified: Secondary | ICD-10-CM

## 2024-09-09 DIAGNOSIS — E8801 Alpha-1-antitrypsin deficiency: Secondary | ICD-10-CM

## 2024-09-09 LAB — CBC
HCT: 28.2 % — ABNORMAL LOW (ref 39.0–52.0)
Hemoglobin: 8.5 g/dL — ABNORMAL LOW (ref 13.0–17.0)
MCH: 28.5 pg (ref 26.0–34.0)
MCHC: 30.1 g/dL (ref 30.0–36.0)
MCV: 94.6 fL (ref 80.0–100.0)
Platelets: 262 K/uL (ref 150–400)
RBC: 2.98 MIL/uL — ABNORMAL LOW (ref 4.22–5.81)
RDW: 17.5 % — ABNORMAL HIGH (ref 11.5–15.5)
WBC: 5.6 K/uL (ref 4.0–10.5)
nRBC: 0 % (ref 0.0–0.2)

## 2024-09-09 LAB — BASIC METABOLIC PANEL WITH GFR
Anion gap: 7 (ref 5–15)
BUN: 23 mg/dL (ref 8–23)
CO2: 36 mmol/L — ABNORMAL HIGH (ref 22–32)
Calcium: 8.2 mg/dL — ABNORMAL LOW (ref 8.9–10.3)
Chloride: 100 mmol/L (ref 98–111)
Creatinine, Ser: 1.1 mg/dL (ref 0.61–1.24)
GFR, Estimated: >60 mL/min (ref >60)
Glucose, Bld: 94 mg/dL (ref 70–99)
Potassium: 4 mmol/L (ref 3.5–5.1)
Sodium: 142 mmol/L (ref 135–145)

## 2024-09-09 LAB — GLUCOSE, CAPILLARY: Glucose-Capillary: 81 mg/dL (ref 70–99)

## 2024-09-09 MED ORDER — IPRATROPIUM-ALBUTEROL 0.5-2.5 (3) MG/3ML IN SOLN
3.0000 mL | Freq: Three times a day (TID) | RESPIRATORY_TRACT | Status: DC
  Administered 2024-09-09 – 2024-09-11 (×7): 3 mL via RESPIRATORY_TRACT
  Filled 2024-09-09 (×7): qty 3

## 2024-09-09 MED ORDER — PREDNISONE 20 MG PO TABS
30.0000 mg | ORAL_TABLET | Freq: Every day | ORAL | Status: DC
  Administered 2024-09-10: 30 mg via ORAL
  Filled 2024-09-09: qty 1

## 2024-09-09 MED ORDER — MUSCLE RUB 10-15 % EX CREA
TOPICAL_CREAM | Freq: Four times a day (QID) | CUTANEOUS | Status: DC | PRN
  Filled 2024-09-09: qty 85

## 2024-09-09 MED ORDER — OXYCODONE HCL 5 MG PO TABS: 5.0000 mg | ORAL_TABLET | Freq: Three times a day (TID) | ORAL | Status: DC | PRN

## 2024-09-09 NOTE — Plan of Care (Signed)
  Problem: Respiratory: Goal: Ability to maintain adequate ventilation will improve Outcome: Progressing   Problem: Clinical Measurements: Goal: Ability to maintain clinical measurements within normal limits will improve Outcome: Progressing Goal: Will remain free from infection Outcome: Progressing Goal: Diagnostic test results will improve Outcome: Progressing Goal: Respiratory complications will improve Outcome: Progressing Goal: Cardiovascular complication will be avoided Outcome: Progressing

## 2024-09-09 NOTE — Consult Note (Signed)
 Consultation Note Date: 09/09/2024   Patient Name: Austin Patterson  DOB: 09/21/1956  MRN: [id number]  Age / Sex: 68 y.o., male   PCP: Autry Grayce LABOR, PA Referring Physician: Maree Hue, MD  Reason for Consultation: Establishing goals of care     Chief Complaint/History of Present Illness:  [name] is a 68 year old male with medical history of hypertension, hyperlipidemia, stage IV lung cancer, asthma, COPD and paroxysmal atrial fibrillation who was admitted with pneumonia and COPD exacerbation after presenting with weakness, muscle aches, fever, and chills.  I met today with [name].  I introduced palliative care as specialized medical care for people living with serious illness. It focuses on providing relief from the symptoms and stress of a serious illness. The goal is to improve quality of life for both the patient and the family.  EMR reviewed including personal review of notes from hospitalist, social work, therapy, and recent outpatient oncology note.  Labs reviewed with sodium 142, potassium 4.0, creatinine 1 point WBC 5.6, hemoglobin 8.5.  He reports that he had most recent infusion around 4 days ago.  In addition to chemotherapy, he needed to receive calcium .  After the second infusion, he reports that he started to feel worse.  At baseline he is on 2 to 3 L at home, however, oxygen  needs greatly increased.  Chest x-ray had concern for left lower lobe pneumonia and follow-up CT revealed Right pleural effusion that is partially loculated and severe emphysema with large bulla.  Numerous new bilateral pulmonary nodules, stable masslike density right anterior hemithorax.  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  He has 2 children and HCPOA paperwork naming them as his surrogate decision makers.  He enjoys fishing, but he is not able to do so due to poor functional status and  oxygen  dependence.  He does still enjoy riding his lawnmower when the weather is nice.  We discussed clinical course as well as wishes moving forward in regard to advanced directives.  Concepts specific to code status and rehospitalization discussed.  We discussed difference between a aggressive medical intervention path and a palliative, comfort focused care path.  Values and goals of care important to patient and family were attempted to be elicited.  He understands that he has stage IV lung cancer that is incurable.  We discussed note of new pulmonary nodules on his CT scan on 11/19 and that this may be continued worsening of his disease.  He has a long standing relationship and trusts Dr. Babara and would like to discuss further with her regarding recommendations.  He is invested in continuing treatment for his cancer with understanding the intent is palliative not curative.   We discussed limitations of care.  He points to DNR bracelet on his wrist.  States he wants to pursue therapies if there is a potential they will add time and quality to his life, but also reports when it is over, its over and states he would not want to be put on life support or have CPR in the event of cardiac arrest or natural death.  Questions and concerns addressed.   PMT will continue to support holistically.    Primary Diagnoses  Present on Admission:  Acute on chronic hypoxic respiratory failure (HCC)  Hyperlipidemia  GERD without esophagitis  Paroxysmal atrial fibrillation (HCC)  Stage IV adenocarcinoma of lung (HCC)  Pulmonary fibrosis (HCC)  CAD (coronary artery disease)  Asthma-COPD overlap syndrome (HCC)  Essential hypertension   Palliative Review of Systems: Reports weak, dyspnea worse with exertion, pain/stiffness in right shoulder  Past Medical History:  Diagnosis Date   Abdominal pain 12/07/2022   Acute urinary retention 12/07/2022   Cancer (HCC)    Chest pain 01/24/2022   COPD (chronic  obstructive pulmonary disease) (HCC)    GERD (gastroesophageal reflux disease)    Hemothorax on right 05/11/2023   Hyperlipidemia    Hypertension    Pleuritic pain 05/10/2023   Pneumothorax after biopsy 05/11/2023   Post procedure discomfort 05/13/2023   Pulmonary fibrosis (HCC) 11/2015   Social History   Socioeconomic History   Marital status: Divorced    Spouse name: Not on file   Number of children: Not on file   Years of education: Not on file   Highest education level: Not on file  Occupational History   Not on file  Tobacco Use   Smoking status: Former    Current packs/day: 0.00    Average packs/day: 2.0 packs/day for 50.0 years (100.0 ttl pk-yrs)    Types: Cigarettes    Start date: 10/13/1965    Quit date: 10/14/2015    Years since quitting: 8.9   Smokeless tobacco: Former  Building Services Engineer status: Never Used  Substance and Sexual Activity   Alcohol use: Yes    Alcohol/week: 56.0 standard drinks of alcohol    Types: 56 Cans of beer per week    Comment: I sit around and drink beer, that's all I got to do   Drug use: No   Sexual activity: Not Currently  Other Topics Concern   Not on file  Social History Narrative   Not on file   Social Drivers of Health   Financial Resource Strain: Not on file  Food Insecurity: No Food Insecurity (09/06/2024)   Hunger Vital Sign    Worried About Running Out of Food in the Last Year: Never true    Ran Out of Food in the Last Year: Never true  Transportation Needs: No Transportation Needs (09/06/2024)   PRAPARE - Administrator, Civil Service (Medical): No    Lack of Transportation (Non-Medical): No  Physical Activity: Not on file  Stress: Not on file  Social Connections: Socially Isolated (09/06/2024)   Social Connection and Isolation Panel    Frequency of Communication with Friends and Family: Three times a week    Frequency of Social Gatherings with Friends and Family: Once a week    Attends Religious  Services: Never    Database Administrator or Organizations: No    Attends Engineer, Structural: Never    Marital Status: Divorced   Family History  Problem Relation Age of Onset   Heart disease Mother    Scheduled Meds:  aspirin  EC  81 mg Oral Daily   atorvastatin   40 mg Oral QHS   calcium -vitamin D   1 tablet Oral BID   clopidogrel   75 mg Oral Daily   enoxaparin  (LOVENOX ) injection  40 mg Subcutaneous Q24H   gabapentin   300 mg Oral TID   ipratropium-albuterol   3 mL Nebulization TID   magnesium  oxide  400 mg Oral Daily   metoprolol  succinate  50 mg Oral Daily   montelukast   10 mg Oral Daily   pantoprazole   40 mg Oral BID   predniSONE   40 mg Oral Q breakfast   sodium chloride  flush  3 mL Intravenous Q12H   Continuous Infusions:  azithromycin  Stopped (09/09/24 0226)   cefTRIAXone  (ROCEPHIN )  IV Stopped (09/09/24 0108)   PRN Meds:.acetaminophen  **OR** acetaminophen , guaiFENesin -dextromethorphan , mouth rinse, oxyCODONE , senna-docusate, sodium chloride  Allergies  Allergen Reactions   Amoxicillin  Anaphylaxis   Tizanidine      Feet and ankle swell    CBC:    Component Value Date/Time   WBC 5.6 09/09/2024 0400   HGB 8.5 (L) 09/09/2024 0400   HGB 10.1 (L) 09/04/2024 1141   HGB 12.2 (L) 06/27/2012 0834   HCT 28.2 (L) 09/09/2024 0400   HCT 36.2 (L) 06/27/2012 0834   PLT 262 09/09/2024 0400   PLT 240 09/04/2024 1141   PLT 396 06/27/2012 0834   MCV 94.6 09/09/2024 0400   MCV 79 (L) 06/27/2012 0834   NEUTROABS 8.7 (H) 09/06/2024 1405   LYMPHSABS 0.1 (L) 09/06/2024 1405   MONOABS 0.2 09/06/2024 1405   EOSABS 0.0 09/06/2024 1405   BASOSABS 0.0 09/06/2024 1405   Comprehensive Metabolic Panel:    Component Value Date/Time   NA 142 09/09/2024 0400   NA 133 (L) 06/27/2012 0834   K 4.0 09/09/2024 0400   K 4.2 06/27/2012 0834   CL 100 09/09/2024 0400   CL 102 06/27/2012 0834   CO2 36 (H) 09/09/2024 0400   CO2 24 06/27/2012 0834   BUN 23 09/09/2024 0400   BUN 9  06/27/2012 0834   CREATININE 1.10 09/09/2024 0400   CREATININE 1.30 (H) 09/04/2024 1141   CREATININE 0.69 06/27/2012 0834   GLUCOSE 94 09/09/2024 0400   GLUCOSE 119 (H) 06/27/2012 0834   CALCIUM  8.2 (L) 09/09/2024 0400   CALCIUM  9.2 06/27/2012 0834   AST 32 09/06/2024 1405   AST 22 09/04/2024 1141   ALT 15 09/06/2024 1405   ALT 15 09/04/2024 1141   ALKPHOS 134 (H) 09/06/2024 1405   BILITOT 0.4 09/06/2024 1405   BILITOT 0.4 09/04/2024 1141   PROT 6.6 09/06/2024 1405   ALBUMIN 3.7 09/06/2024 1405    Physical Exam: Vital Signs: BP 125/72 (BP Location: Left Arm)   Pulse 96   Temp 98.3 F (36.8 C)   Resp 16   Ht 5' 7 (1.702 m)   Wt 84.4 kg   SpO2 92%   BMI 29.13 kg/m  SpO2: SpO2: 92 % O2 Device: O2 Device: Nasal Cannula O2 Flow Rate: O2 Flow Rate (L/min): 4 L/min Intake/output summary:  Intake/Output Summary (Last 24 hours) at 09/09/2024 1012 Last data filed at 09/09/2024 0630 Gross per 24 hour  Intake 1660 ml  Output 1400 ml  Net 260 ml   LBM: Last BM Date : 09/08/24 Baseline Weight: Weight: 78.5 kg Most recent weight: Weight: 84.4 kg  General: NAD, alert, mild increase work of breathing Eyes: conjunctiva clear, anicteric sclera HENT: normocephalic, atraumatic, moist mucous membranes Cardiovascular: RRR Respiratory: Rhonchi Skin: no rashes or lesions on visible skin Neuro: A&O, following commands easily Psych: appropriately answers all questions          Palliative Performance Scale: 60              Additional Data Reviewed: Recent Labs    09/08/24 0449 09/09/24 0400  WBC 7.6 5.6  HGB 8.5* 8.5*  PLT 259 262  NA 139 142  BUN 26* 23  CREATININE 1.08 1.10    Imaging: CT CHEST WO CONTRAST CLINICAL DATA:  Pneumonia.  History of lung cancer.  EXAM: CT CHEST WITHOUT CONTRAST  TECHNIQUE: Multidetector CT imaging of the chest was performed following the standard protocol without IV contrast.  RADIATION  DOSE REDUCTION: This exam was performed  according to the departmental dose-optimization program which includes automated exposure control, adjustment of the mA and/or kV according to patient size and/or use of iterative reconstruction technique.  COMPARISON:  Chest x-ray same day.  CT angiogram chest 07/13/2024.  FINDINGS: Cardiovascular: There is a trace pericardial effusion, unchanged. Heart is mildly enlarged. Aorta is normal in size. There are atherosclerotic calcifications of the aorta and coronary arteries. Left chest port catheter tip ends in the SVC.  Mediastinum/Nodes: No enlarged mediastinal or axillary lymph nodes. Thyroid  gland, trachea, and esophagus demonstrate no significant findings.  Lungs/Pleura: Left pleural effusion has nearly completely resolved. Small to moderate-sized right pleural effusion is unchanged, partially loculated. Severe emphysema again seen with large bulla in the right lower hemithorax. There are numerous new bilateral pulmonary nodules, more significant surrounding the lung lung measuring up to 14 mm. There is a new nodular mass in the right lung base measuring 2.6 cm image 3/110. Masslike density in the anterior right lower hemithorax extending to the hilum appears unchanged. There is no new focal lung infiltrate.  Upper Abdomen: No acute abnormality.  Musculoskeletal: There are vertebroplasty changes are similar to prior. No new fractures are identified.  IMPRESSION: 1. New bilateral pulmonary nodules worrisome for metastatic disease. 2. Stable masslike density in the anterior right lower hemithorax extending to the hilum. 3. Stable small to moderate-sized right pleural effusion, partially loculated. 4. Near complete resolution of left pleural effusion. 5. Stable trace pericardial effusion.  Aortic Atherosclerosis (ICD10-I70.0) and Emphysema (ICD10-J43.9).  Electronically Signed   By: Greig Pique M.D.   On: 09/06/2024 19:46 DG Chest Port 1 View EXAM: 1 VIEW(S) XRAY OF  THE CHEST 09/06/2024 02:55:00 PM  COMPARISON: 08/23/2024  CLINICAL HISTORY: Oxygen  dependent 808842  FINDINGS:  LINES, TUBES AND DEVICES: Left chest port with tip at SVC.  LUNGS AND PLEURA: The known right middle lobe and medial left lung base lesions are suboptimally visualized on the current radiograph. Again seen is diffuse interstitial prominence with progressive patchy nodular densities in the left base, which may reflect aspiration or pneumonia. Small to moderate right pleural effusion is slightly increased in the interval. No pneumothorax.  HEART AND MEDIASTINUM: No acute abnormality of the cardiac and mediastinal silhouettes.  BONES AND SOFT TISSUES: No acute osseous abnormality.  IMPRESSION: 1. Diffuse interstitial prominence with progressive patchy nodular densities in the left lung base, possibly reflecting aspiration or pneumonia. 2. Small to moderate right pleural effusion, slightly increased in the interval.  Electronically signed by: Waddell Calk MD 09/06/2024 03:48 PM EST RP Workstation: [id number]    I personally reviewed recent imaging.   Palliative Care Assessment and Plan Summary of Established Goals of Care and Medical Treatment Preferences    # Complex medical decision making/goals of care  - Continue current interventions.  He understands that his cancer is incurable.  We also discussed note of new pulmonary nodules on his CT scan on 11/19.  He has a long standing relationship and trusts Dr. Babara and would like to discuss further with her.  He is invested in continuing treatment for his cancer with understanding the intent is palliative not curative.  - He is agreeable to rehab at the end of stay if necessary. He does not want to go to [facility].  - He has completed HCPOA paperwork naming son Blanton Cornwall III) and daughter (Sara Kingsford) as surrogates in that order.  Copy reviewed in ACP tab.  -  Code Status: Do  not attempt resuscitation  (DNR) PRE-ARREST INTERVENTIONS DESIRED  Prognosis: Unable to determine  # Symptom management Patient is receiving these palliative interventions for symptom management with an intent to improve quality of life.   - Reports shoulder pain and requesting cream he received in the past.  Reports smelled strong Lucienne Siad?). Will review chart to see if can determine what he had in the past.  # Psycho-social/Spiritual Support:  - Support System: 2 children - Desire for further Chaplain support:did not discuss today  # Discharge Planning:  To Be Determined- agreeable to rehab if recommended.  Otherwise plan to return home.   Thank you for allowing the palliative care team to participate in the care Deward FORBES Larri Amaryllis Oneita, MD Palliative Care Provider PMT # [phone]  If patient remains symptomatic despite maximum doses, please call PMT at [phone] between 0700 and 1900. Outside of these hours, please call attending, as PMT does not have night coverage.   I personally spent a total of 80 minutes in the care of the patient today including preparing to see the patient, getting/reviewing separately obtained history, performing a medically appropriate exam/evaluation, counseling and educating, referring and communicating with other health care professionals, documenting clinical information in the EHR, and independently interpreting results.

## 2024-09-09 NOTE — TOC Progression Note (Signed)
 Transition of Care [facility]) - Progression Note    Patient Details  Name: Austin Patterson MRN: [id number] Date of Birth: July 23, 1956  [facility] CM/SW Contact  [name], RN Phone Number: 09/09/2024, 5:18 PM  Clinical Narrative:  RNCM reviewed the chart. Spoke to the patient, introduced role and explained that discharge planning would be discussed. PT is recommending STR. Patient is in agreement. I told him that we submitted him for bed offers and once they come in we will review them with him and he can pick the one he wants to got to. Patient verbalized understanding. RNCM will continue to follow for discharge planning needs.                      Expected Discharge Plan and Services                                               Social Drivers of Health (SDOH) Interventions SDOH Screenings   Food Insecurity: No Food Insecurity (09/06/2024)  Housing: Low Risk  (09/06/2024)  Transportation Needs: No Transportation Needs (09/06/2024)  Utilities: Not At Risk (09/06/2024)  Depression (PHQ2-9): Low Risk  (09/06/2024)  Social Connections: Socially Isolated (09/06/2024)  Tobacco Use: Medium Risk (09/06/2024)    Readmission Risk Interventions    09/08/2024    2:15 PM 06/14/2023   11:19 AM  Readmission Risk Prevention Plan  Transportation Screening Complete Complete  PCP or Specialist Appt within 3-5 Days Complete   HRI or Home Care Consult Complete   Social Work Consult for Recovery Care Planning/Counseling Complete   Palliative Care Screening Not Applicable   Medication Review Oceanographer) Referral to Pharmacy Complete  PCP or Specialist appointment within 3-5 days of discharge  Complete  SW Recovery Care/Counseling Consult  Complete  Palliative Care Screening  Not Applicable  Skilled Nursing Facility  Not Applicable

## 2024-09-09 NOTE — Plan of Care (Signed)

## 2024-09-09 NOTE — Progress Notes (Signed)
 PROGRESS NOTE    Austin Patterson  FMW:[id number] DOB: Nov 11, 1955 DOA: 09/06/2024 PCP: Autry Grayce LABOR, PA   Brief Narrative:   68 y.o. year old male with medical history of HTN, HLD, Stage IV Lung Cancer, Asthma, COPD and paroxymal atrial fibrillation presenting to the ED with weakness along with muscles aches, fevers and chills   11/20: PT, OT eval, DC tele, transfer to med-surg 11/21: Lasix  80 mg IV once 11/22: Palliative care consult, tapering off prednisone  and cut back on oxycodone .  Added muscle rub cream   Assessment & Plan:   Principal Problem:   Acute on chronic hypoxic respiratory failure (HCC) Active Problems:   CAD (coronary artery disease)   Pulmonary fibrosis (HCC)   Essential hypertension   Hyperlipidemia   Asthma-COPD overlap syndrome (HCC)   GERD without esophagitis   Stage IV adenocarcinoma of lung (HCC)   Paroxysmal atrial fibrillation (HCC)   Acute on chronic hypoxic respiratory failure likely multifactorial with community acquired pneumonia and COPD exacerbation.   .continue Rocephin  & Zithromax . Continue to wean O2 - down to 4 liters now. At baseline uses 2-3 liters   Hypertension: Continue Metoprolol    Hyperlipidemia: Continue home statin.   GERD: Continue home PPI   HFpEF: euvolemic on exam.  Last EF 2 months ago was 55%.   Paroxysmal A-fib: In sinus rhythm.   Stage IV adenocarcinoma of lung   [name].  Currently getting gemcitabine .  Goal is palliative in nature.  Patient with previous history of loculated right-sided effusion that was present and discussed on recent visit on 09/04/2024.  Shoulder pain: Muscle rub cream added by palliative care team  GOC - Palliative care input appreciated   DVT prophylaxis: Lovenox  Place and maintain sequential compression device Start: 09/07/24 1530 enoxaparin  (LOVENOX ) injection 40 mg Start: 09/06/24 2000     Code Status: DNR Family Communication: None at  bedside Disposition Plan: possible D/C in 1-2 days depending on clinical condition     Antimicrobials:  Rocephin  Azithromycin     Subjective:  Feeling somewhat better.  Having some shoulder pain  Objective: Vitals:   09/08/24 2200 09/09/24 0437 09/09/24 0456 09/09/24 1440  BP:  125/72  128/69  Pulse:  96  (!) 101  Resp:  16  17  Temp:  98.3 F (36.8 C)  (!) 97.2 F (36.2 C)  TempSrc:      SpO2: 98% 92%  (!) 89%  Weight:   84.4 kg   Height:        Intake/Output Summary (Last 24 hours) at 09/09/2024 1445 Last data filed at 09/09/2024 1429 Gross per 24 hour  Intake 2140 ml  Output 1400 ml  Net 740 ml   Filed Weights   09/06/24 1403 09/08/24 0500 09/09/24 0456  Weight: 78.5 kg 85.9 kg 84.4 kg    Examination:  General exam: Appears calm and comfortable  Respiratory system: Rhonchi at the LLL bases Cardiovascular system: S1 & S2 heard, RRR. No pedal edema. Gastrointestinal system: Abdomen is soft, benign Central nervous system: Alert and oriented. No focal neurological deficits. Extremities: Symmetric 5 x 5 power. Skin: No rashes, lesions or ulcers Psychiatry: Judgement and insight appear normal. Mood & affect appropriate.     Data Reviewed: I have personally reviewed following labs and imaging studies  CBC: Recent Labs  Lab 09/04/24 1141 09/06/24 1405 09/07/24 0332 09/08/24 0449 09/09/24 0400  WBC 6.2 9.1 7.8 7.6 5.6  NEUTROABS 5.1 8.7*  --   --   --  HGB 10.1* 10.4* 9.2* 8.5* 8.5*  HCT 33.1* 34.3* 30.1* 29.4* 28.2*  MCV 93.0 95.8 94.1 98.3 94.6  PLT 240 296 262 259 262   Basic Metabolic Panel: Recent Labs  Lab 09/04/24 1141 09/06/24 1405 09/07/24 0332 09/08/24 0449 09/09/24 0400  NA 137 142 142 139 142  K 4.2 4.7 4.4 4.1 4.0  CL 101 104 104 103 100  CO2 27 25 27 31  36*  GLUCOSE 96 122* 141* 116* 94  BUN 19 25* 23 26* 23  CREATININE 1.30* 1.29* 1.23 1.08 1.10  CALCIUM  7.9* 9.2 8.3* 8.0* 8.2*  MG  --  1.7  --   --   --     GFR: Estimated Creatinine Clearance: 66.7 mL/min (by C-G formula based on SCr of 1.1 mg/dL). Liver Function Tests: Recent Labs  Lab 09/04/24 1141 09/06/24 1405  AST 22 32  ALT 15 15  ALKPHOS 112 134*  BILITOT 0.4 0.4  PROT 6.7 6.6  ALBUMIN 3.5 3.7   No results for input(s): LIPASE, AMYLASE in the last 168 hours. No results for input(s): AMMONIA in the last 168 hours. Coagulation Profile: Recent Labs  Lab 09/06/24 2008  INR 1.1    Sepsis Labs: Recent Labs  Lab 09/06/24 1405 09/06/24 1625  LATICACIDVEN 2.1* 1.1    Recent Results (from the past 240 hours)  Resp panel by RT-PCR (RSV, Flu A&B, Covid) Anterior Nasal Swab     Status: None   Collection Time: 09/06/24  2:37 PM   Specimen: Anterior Nasal Swab  Result Value Ref Range Status   SARS Coronavirus 2 by RT PCR NEGATIVE NEGATIVE Final    Comment: (NOTE) SARS-CoV-2 target nucleic acids are NOT DETECTED.  The SARS-CoV-2 RNA is generally detectable in upper respiratory specimens during the acute phase of infection. The lowest concentration of SARS-CoV-2 viral copies this assay can detect is 138 copies/mL. A negative result does not preclude SARS-Cov-2 infection and should not be used as the sole basis for treatment or other patient management decisions. A negative result may occur with  improper specimen collection/handling, submission of specimen other than nasopharyngeal swab, presence of viral mutation(s) within the areas targeted by this assay, and inadequate number of viral copies(<138 copies/mL). A negative result must be combined with clinical observations, patient history, and epidemiological information. The expected result is Negative.  Fact Sheet for Patients:  bloggercourse.com  Fact Sheet for Healthcare Providers:  seriousbroker.it  This test is no t yet approved or cleared by the [place]  FDA and  has been authorized for detection  and/or diagnosis of SARS-CoV-2 by FDA under an Emergency Use Authorization (EUA). This EUA will remain  in effect (meaning this test can be used) for the duration of the COVID-19 declaration under Section 564(b)(1) of the Act, 21 U.S.C.section 360bbb-3(b)(1), unless the authorization is terminated  or revoked sooner.       Influenza A by PCR NEGATIVE NEGATIVE Final   Influenza B by PCR NEGATIVE NEGATIVE Final    Comment: (NOTE) The Xpert Xpress SARS-CoV-2/FLU/RSV plus assay is intended as an aid in the diagnosis of influenza from Nasopharyngeal swab specimens and should not be used as a sole basis for treatment. Nasal washings and aspirates are unacceptable for Xpert Xpress SARS-CoV-2/FLU/RSV testing.  Fact Sheet for Patients: bloggercourse.com  Fact Sheet for Healthcare Providers: seriousbroker.it  This test is not yet approved or cleared by the [place]  FDA and has been authorized for detection and/or diagnosis of SARS-CoV-2 by FDA under an Emergency  Use Authorization (EUA). This EUA will remain in effect (meaning this test can be used) for the duration of the COVID-19 declaration under Section 564(b)(1) of the Act, 21 U.S.C. section 360bbb-3(b)(1), unless the authorization is terminated or revoked.     Resp Syncytial Virus by PCR NEGATIVE NEGATIVE Final    Comment: (NOTE) Fact Sheet for Patients: bloggercourse.com  Fact Sheet for Healthcare Providers: seriousbroker.it  This test is not yet approved or cleared by the [place]  FDA and has been authorized for detection and/or diagnosis of SARS-CoV-2 by FDA under an Emergency Use Authorization (EUA). This EUA will remain in effect (meaning this test can be used) for the duration of the COVID-19 declaration under Section 564(b)(1) of the Act, 21 U.S.C. section 360bbb-3(b)(1), unless the authorization is terminated  or revoked.  Performed at [facility], [address] [place], [place] [address]   Blood Culture (routine x 2)     Status: None (Preliminary result)   Collection Time: 09/06/24  2:38 PM   Specimen: BLOOD  Result Value Ref Range Status   Specimen Description BLOOD BLOOD RIGHT WRIST  Final   Special Requests   Final    BOTTLES DRAWN AEROBIC AND ANAEROBIC Blood Culture results may not be optimal due to an inadequate volume of blood received in culture bottles   Culture   Final    NO GROWTH 3 DAYS Performed at [facility], [address] [place], [place] [address]    Report Status PENDING  Incomplete  Blood Culture (routine x 2)     Status: None (Preliminary result)   Collection Time: 09/06/24  2:43 PM   Specimen: BLOOD  Result Value Ref Range Status   Specimen Description BLOOD LEFT HAND  Final   Special Requests   Final    BOTTLES DRAWN AEROBIC AND ANAEROBIC Blood Culture results may not be optimal due to an inadequate volume of blood received in culture bottles   Culture   Final    NO GROWTH 3 DAYS Performed at [facility], [address], [place], [place] [address]    Report Status PENDING  Incomplete         Radiology Studies: No results found.       Scheduled Meds:  aspirin  EC  81 mg Oral Daily   atorvastatin   40 mg Oral QHS   calcium -vitamin D   1 tablet Oral BID   clopidogrel   75 mg Oral Daily   enoxaparin  (LOVENOX ) injection  40 mg Subcutaneous Q24H   gabapentin   300 mg Oral TID   ipratropium-albuterol   3 mL Nebulization TID   magnesium  oxide  400 mg Oral Daily   metoprolol  succinate  50 mg Oral Daily   montelukast   10 mg Oral Daily   pantoprazole   40 mg Oral BID   predniSONE   40 mg Oral Q breakfast   sodium chloride  flush  3 mL Intravenous Q12H   Continuous Infusions:  azithromycin  Stopped (09/09/24 0226)   cefTRIAXone  (ROCEPHIN )  IV Stopped (09/09/24 0108)     LOS: 3 days    Time spent: 35 mins    Arsalan Brisbin  Maree, MD [facility] Pager [phone]  If 7PM-7AM, please contact night-coverage [email or web address]  09/09/2024, 2:45 PM

## 2024-09-10 DIAGNOSIS — I2510 Atherosclerotic heart disease of native coronary artery without angina pectoris: Secondary | ICD-10-CM | POA: Diagnosis not present

## 2024-09-10 DIAGNOSIS — Z515 Encounter for palliative care: Secondary | ICD-10-CM | POA: Diagnosis not present

## 2024-09-10 DIAGNOSIS — G8929 Other chronic pain: Secondary | ICD-10-CM

## 2024-09-10 DIAGNOSIS — J9621 Acute and chronic respiratory failure with hypoxia: Secondary | ICD-10-CM | POA: Diagnosis not present

## 2024-09-10 DIAGNOSIS — J9601 Acute respiratory failure with hypoxia: Secondary | ICD-10-CM | POA: Diagnosis not present

## 2024-09-10 DIAGNOSIS — K5900 Constipation, unspecified: Secondary | ICD-10-CM

## 2024-09-10 DIAGNOSIS — I10 Essential (primary) hypertension: Secondary | ICD-10-CM | POA: Diagnosis not present

## 2024-09-10 DIAGNOSIS — M25511 Pain in right shoulder: Secondary | ICD-10-CM

## 2024-09-10 DIAGNOSIS — C3490 Malignant neoplasm of unspecified part of unspecified bronchus or lung: Secondary | ICD-10-CM | POA: Diagnosis not present

## 2024-09-10 DIAGNOSIS — K219 Gastro-esophageal reflux disease without esophagitis: Secondary | ICD-10-CM | POA: Diagnosis not present

## 2024-09-10 LAB — CBC
HCT: 29.3 % — ABNORMAL LOW (ref 39.0–52.0)
Hemoglobin: 8.6 g/dL — ABNORMAL LOW (ref 13.0–17.0)
MCH: 28 pg (ref 26.0–34.0)
MCHC: 29.4 g/dL — ABNORMAL LOW (ref 30.0–36.0)
MCV: 95.4 fL (ref 80.0–100.0)
Platelets: 237 K/uL (ref 150–400)
RBC: 3.07 MIL/uL — ABNORMAL LOW (ref 4.22–5.81)
RDW: 17.2 % — ABNORMAL HIGH (ref 11.5–15.5)
WBC: 2.5 K/uL — ABNORMAL LOW (ref 4.0–10.5)
nRBC: 0.8 % — ABNORMAL HIGH (ref 0.0–0.2)

## 2024-09-10 LAB — BASIC METABOLIC PANEL WITH GFR
Anion gap: 7 (ref 5–15)
BUN: 22 mg/dL (ref 8–23)
CO2: 33 mmol/L — ABNORMAL HIGH (ref 22–32)
Calcium: 8 mg/dL — ABNORMAL LOW (ref 8.9–10.3)
Chloride: 100 mmol/L (ref 98–111)
Creatinine, Ser: 0.89 mg/dL (ref 0.61–1.24)
GFR, Estimated: >60 mL/min
Glucose, Bld: 90 mg/dL (ref 70–99)
Potassium: 3.8 mmol/L (ref 3.5–5.1)
Sodium: 140 mmol/L (ref 135–145)

## 2024-09-10 LAB — GLUCOSE, CAPILLARY: Glucose-Capillary: 86 mg/dL (ref 70–99)

## 2024-09-10 MED ORDER — FUROSEMIDE 10 MG/ML IJ SOLN
80.0000 mg | Freq: Once | INTRAMUSCULAR | Status: DC
  Filled 2024-09-10: qty 8

## 2024-09-10 MED ORDER — OXYCODONE HCL 5 MG PO TABS: 5.0000 mg | ORAL_TABLET | Freq: Two times a day (BID) | ORAL | Status: DC | PRN

## 2024-09-10 MED ORDER — PREDNISONE 20 MG PO TABS
20.0000 mg | ORAL_TABLET | Freq: Every day | ORAL | Status: AC
  Administered 2024-09-11: 20 mg via ORAL
  Filled 2024-09-10: qty 1

## 2024-09-10 MED ORDER — SENNOSIDES-DOCUSATE SODIUM 8.6-50 MG PO TABS
2.0000 | ORAL_TABLET | Freq: Two times a day (BID) | ORAL | Status: DC | PRN
  Administered 2024-09-11: 2 via ORAL
  Filled 2024-09-10: qty 2

## 2024-09-10 MED ORDER — FUROSEMIDE 10 MG/ML IJ SOLN
80.0000 mg | Freq: Once | INTRAMUSCULAR | Status: AC
  Administered 2024-09-11: 80 mg via INTRAVENOUS
  Filled 2024-09-10: qty 8

## 2024-09-10 NOTE — Plan of Care (Signed)
  Problem: Fluid Volume: Goal: Hemodynamic stability will improve 09/10/2024 1927 by Arlyss Tolbert BIRCH, RN Outcome: Progressing 09/10/2024 1927 by Arlyss Tolbert D, RN Outcome: Progressing   Problem: Clinical Measurements: Goal: Diagnostic test results will improve 09/10/2024 1927 by Arlyss Tolbert BIRCH, RN Outcome: Progressing 09/10/2024 1927 by Arlyss Tolbert D, RN Outcome: Progressing Goal: Signs and symptoms of infection will decrease 09/10/2024 1927 by Arlyss Tolbert BIRCH, RN Outcome: Progressing 09/10/2024 1927 by Arlyss Tolbert D, RN Outcome: Progressing   Problem: Respiratory: Goal: Ability to maintain adequate ventilation will improve 09/10/2024 1927 by Arlyss Tolbert BIRCH, RN Outcome: Progressing 09/10/2024 1927 by Arlyss Tolbert BIRCH, RN Outcome: Progressing   Problem: Education: Goal: Knowledge of General Education information will improve Description: Including pain rating scale, medication(s)/side effects and non-pharmacologic comfort measures 09/10/2024 1927 by Arlyss Tolbert BIRCH, RN Outcome: Progressing 09/10/2024 1927 by Arlyss Tolbert BIRCH, RN Outcome: Progressing   Problem: Health Behavior/Discharge Planning: Goal: Ability to manage health-related needs will improve 09/10/2024 1927 by Arlyss Tolbert BIRCH, RN Outcome: Progressing 09/10/2024 1927 by Arlyss Tolbert BIRCH, RN Outcome: Progressing   Problem: Clinical Measurements: Goal: Ability to maintain clinical measurements within normal limits will improve 09/10/2024 1927 by Arlyss Tolbert BIRCH, RN Outcome: Progressing 09/10/2024 1927 by Arlyss Tolbert D, RN Outcome: Progressing Goal: Will remain free from infection 09/10/2024 1927 by Arlyss Tolbert BIRCH, RN Outcome: Progressing 09/10/2024 1927 by Arlyss Tolbert D, RN Outcome: Progressing Goal: Diagnostic test results will improve 09/10/2024 1927 by Arlyss Tolbert BIRCH, RN Outcome: Progressing 09/10/2024 1927 by Arlyss Tolbert D, RN Outcome:  Progressing Goal: Respiratory complications will improve 09/10/2024 1927 by Arlyss Tolbert BIRCH, RN Outcome: Progressing 09/10/2024 1927 by Arlyss Tolbert D, RN Outcome: Progressing Goal: Cardiovascular complication will be avoided 09/10/2024 1927 by Arlyss Tolbert D, RN Outcome: Progressing 09/10/2024 1927 by Arlyss Tolbert BIRCH, RN Outcome: Progressing   Problem: Activity: Goal: Risk for activity intolerance will decrease 09/10/2024 1927 by Arlyss Tolbert BIRCH, RN Outcome: Progressing 09/10/2024 1927 by Arlyss Tolbert D, RN Outcome: Progressing   Problem: Nutrition: Goal: Adequate nutrition will be maintained 09/10/2024 1927 by Arlyss Tolbert BIRCH, RN Outcome: Progressing 09/10/2024 1927 by Arlyss Tolbert D, RN Outcome: Progressing   Problem: Coping: Goal: Level of anxiety will decrease 09/10/2024 1927 by Arlyss Tolbert BIRCH, RN Outcome: Progressing 09/10/2024 1927 by Arlyss Tolbert BIRCH, RN Outcome: Progressing   Problem: Elimination: Goal: Will not experience complications related to bowel motility 09/10/2024 1927 by Arlyss Tolbert BIRCH, RN Outcome: Progressing 09/10/2024 1927 by Arlyss Tolbert BIRCH, RN Outcome: Progressing Goal: Will not experience complications related to urinary retention 09/10/2024 1927 by Arlyss Tolbert BIRCH, RN Outcome: Progressing 09/10/2024 1927 by Arlyss Tolbert D, RN Outcome: Progressing   Problem: Pain Managment: Goal: General experience of comfort will improve and/or be controlled 09/10/2024 1927 by Arlyss Tolbert BIRCH, RN Outcome: Progressing 09/10/2024 1927 by Arlyss Tolbert BIRCH, RN Outcome: Progressing   Problem: Safety: Goal: Ability to remain free from injury will improve 09/10/2024 1927 by Arlyss Tolbert BIRCH, RN Outcome: Progressing 09/10/2024 1927 by Arlyss Tolbert D, RN Outcome: Progressing   Problem: Skin Integrity: Goal: Risk for impaired skin integrity will decrease 09/10/2024 1927 by Arlyss Tolbert BIRCH, RN Outcome:  Progressing 09/10/2024 1927 by Arlyss Tolbert BIRCH, RN Outcome: Progressing

## 2024-09-10 NOTE — Care Management Important Message (Signed)
 Important Message  Patient Details  Name: Austin Patterson MRN: [id number] Date of Birth: 1956-01-20   Important Message Given:  Yes - Medicare IM     Brenson Hartman W, CMA 09/10/2024, 1:31 PM

## 2024-09-10 NOTE — Plan of Care (Signed)

## 2024-09-10 NOTE — Plan of Care (Signed)

## 2024-09-10 NOTE — Progress Notes (Signed)
 PROGRESS NOTE    Austin Patterson  FMW:[id number] DOB: 10-28-1955 DOA: 09/06/2024 PCP: Autry Grayce LABOR, PA   Brief Narrative:   69 y.o. year old male with medical history of HTN, HLD, Stage IV Lung Cancer, Asthma, COPD and paroxymal atrial fibrillation presenting to the ED with weakness along with muscles aches, fevers and chills   11/20: PT, OT eval, DC tele, transfer to med-surg 11/21: Lasix  80 mg IV once 11/22: Palliative care consult, tapering off prednisone  and cut back on oxycodone .  Added muscle rub cream 11/23: Tapering oxycodone  and prednisone .  Giving 80 mg of Lasix  IV x 1   Assessment & Plan:   Principal Problem:   Acute on chronic hypoxic respiratory failure (HCC) Active Problems:   CAD (coronary artery disease)   Pulmonary fibrosis (HCC)   Essential hypertension   Hyperlipidemia   Asthma-COPD overlap syndrome (HCC)   GERD without esophagitis   Stage IV adenocarcinoma of lung (HCC)   Paroxysmal atrial fibrillation (HCC)   Acute on chronic hypoxic respiratory failure likely multifactorial with community acquired pneumonia and COPD exacerbation.   Completed course of Rocephin  & Zithromax . Continue to wean O2 - down to 4 liters now. At baseline uses 2-3 liters will give 80 mg of IV Lasix  once   Hypertension: Continue Metoprolol    Hyperlipidemia: Continue home statin.   GERD: Continue home PPI   HFpEF: euvolemic on exam.  Last EF 2 months ago was 55%.   Paroxysmal A-fib: In sinus rhythm.   Stage IV adenocarcinoma of lung   [name].  Currently getting gemcitabine .  Goal is palliative in nature.  Patient with previous history of loculated right-sided effusion that was present and discussed on recent visit on 09/04/2024.  Shoulder pain: Muscle rub cream added by palliative care team  GOC - Palliative care input appreciated   DVT prophylaxis: Lovenox  Place and maintain sequential compression device Start: 09/07/24 1530 enoxaparin   (LOVENOX ) injection 40 mg Start: 09/06/24 2000     Code Status: DNR Family Communication: None at bedside Disposition Plan: possible D/C in 1-2 days depending on clinical condition.  Waiting for SNF placement     Antimicrobials: Completed Rocephin  Azithromycin     Subjective:  Show troponin improved with local muscle cream.  Objective: Vitals:   09/09/24 1938 09/10/24 0340 09/10/24 0500 09/10/24 0839  BP: 125/67 134/80  (!) 139/90  Pulse: 97 98  (!) 102  Resp: 18 18  18   Temp: 97.8 F (36.6 C) 98.4 F (36.9 C)  98.3 F (36.8 C)  TempSrc:    Oral  SpO2: 96% 91%  91%  Weight:   81.3 kg   Height:        Intake/Output Summary (Last 24 hours) at 09/10/2024 1512 Last data filed at 09/10/2024 1445 Gross per 24 hour  Intake 1080 ml  Output 700 ml  Net 380 ml   Filed Weights   09/08/24 0500 09/09/24 0456 09/10/24 0500  Weight: 85.9 kg 84.4 kg 81.3 kg    Examination:  General exam: Appears calm and comfortable  Respiratory system: Rhonchi at the LLL bases Cardiovascular system: S1 & S2 heard, RRR. No pedal edema. Gastrointestinal system: Abdomen is soft, benign Central nervous system: Alert and oriented. No focal neurological deficits. Extremities: Symmetric 5 x 5 power. Skin: No rashes, lesions or ulcers Psychiatry: Judgement and insight appear normal. Mood & affect appropriate.     Data Reviewed: I have personally reviewed following labs and imaging studies  CBC: Recent  Labs  Lab 09/04/24 1141 09/06/24 1405 09/07/24 0332 09/08/24 0449 09/09/24 0400 09/10/24 0604  WBC 6.2 9.1 7.8 7.6 5.6 2.5*  NEUTROABS 5.1 8.7*  --   --   --   --   HGB 10.1* 10.4* 9.2* 8.5* 8.5* 8.6*  HCT 33.1* 34.3* 30.1* 29.4* 28.2* 29.3*  MCV 93.0 95.8 94.1 98.3 94.6 95.4  PLT 240 296 262 259 262 237   Basic Metabolic Panel: Recent Labs  Lab 09/06/24 1405 09/07/24 0332 09/08/24 0449 09/09/24 0400 09/10/24 0604  NA 142 142 139 142 140  K 4.7 4.4 4.1 4.0 3.8  CL 104 104  103 100 100  CO2 25 27 31  36* 33*  GLUCOSE 122* 141* 116* 94 90  BUN 25* 23 26* 23 22  CREATININE 1.29* 1.23 1.08 1.10 0.89  CALCIUM  9.2 8.3* 8.0* 8.2* 8.0*  MG 1.7  --   --   --   --    GFR: Estimated Creatinine Clearance: 81.1 mL/min (by C-G formula based on SCr of 0.89 mg/dL). Liver Function Tests: Recent Labs  Lab 09/04/24 1141 09/06/24 1405  AST 22 32  ALT 15 15  ALKPHOS 112 134*  BILITOT 0.4 0.4  PROT 6.7 6.6  ALBUMIN 3.5 3.7   No results for input(s): LIPASE, AMYLASE in the last 168 hours. No results for input(s): AMMONIA in the last 168 hours. Coagulation Profile: Recent Labs  Lab 09/06/24 2008  INR 1.1    Sepsis Labs: Recent Labs  Lab 09/06/24 1405 09/06/24 1625  LATICACIDVEN 2.1* 1.1    Recent Results (from the past 240 hours)  Resp panel by RT-PCR (RSV, Flu A&B, Covid) Anterior Nasal Swab     Status: None   Collection Time: 09/06/24  2:37 PM   Specimen: Anterior Nasal Swab  Result Value Ref Range Status   SARS Coronavirus 2 by RT PCR NEGATIVE NEGATIVE Final    Comment: (NOTE) SARS-CoV-2 target nucleic acids are NOT DETECTED.  The SARS-CoV-2 RNA is generally detectable in upper respiratory specimens during the acute phase of infection. The lowest concentration of SARS-CoV-2 viral copies this assay can detect is 138 copies/mL. A negative result does not preclude SARS-Cov-2 infection and should not be used as the sole basis for treatment or other patient management decisions. A negative result may occur with  improper specimen collection/handling, submission of specimen other than nasopharyngeal swab, presence of viral mutation(s) within the areas targeted by this assay, and inadequate number of viral copies(<138 copies/mL). A negative result must be combined with clinical observations, patient history, and epidemiological information. The expected result is Negative.  Fact Sheet for Patients:   bloggercourse.com  Fact Sheet for Healthcare Providers:  seriousbroker.it  This test is no t yet approved or cleared by the [place]  FDA and  has been authorized for detection and/or diagnosis of SARS-CoV-2 by FDA under an Emergency Use Authorization (EUA). This EUA will remain  in effect (meaning this test can be used) for the duration of the COVID-19 declaration under Section 564(b)(1) of the Act, 21 U.S.C.section 360bbb-3(b)(1), unless the authorization is terminated  or revoked sooner.       Influenza A by PCR NEGATIVE NEGATIVE Final   Influenza B by PCR NEGATIVE NEGATIVE Final    Comment: (NOTE) The Xpert Xpress SARS-CoV-2/FLU/RSV plus assay is intended as an aid in the diagnosis of influenza from Nasopharyngeal swab specimens and should not be used as a sole basis for treatment. Nasal washings and aspirates are unacceptable for Xpert Xpress  SARS-CoV-2/FLU/RSV testing.  Fact Sheet for Patients: bloggercourse.com  Fact Sheet for Healthcare Providers: seriousbroker.it  This test is not yet approved or cleared by the [place]  FDA and has been authorized for detection and/or diagnosis of SARS-CoV-2 by FDA under an Emergency Use Authorization (EUA). This EUA will remain in effect (meaning this test can be used) for the duration of the COVID-19 declaration under Section 564(b)(1) of the Act, 21 U.S.C. section 360bbb-3(b)(1), unless the authorization is terminated or revoked.     Resp Syncytial Virus by PCR NEGATIVE NEGATIVE Final    Comment: (NOTE) Fact Sheet for Patients: bloggercourse.com  Fact Sheet for Healthcare Providers: seriousbroker.it  This test is not yet approved or cleared by the [place]  FDA and has been authorized for detection and/or diagnosis of SARS-CoV-2 by FDA under an Emergency Use  Authorization (EUA). This EUA will remain in effect (meaning this test can be used) for the duration of the COVID-19 declaration under Section 564(b)(1) of the Act, 21 U.S.C. section 360bbb-3(b)(1), unless the authorization is terminated or revoked.  Performed at [facility], [address] [place], [place] [address]   Blood Culture (routine x 2)     Status: None (Preliminary result)   Collection Time: 09/06/24  2:38 PM   Specimen: BLOOD  Result Value Ref Range Status   Specimen Description BLOOD BLOOD RIGHT WRIST  Final   Special Requests   Final    BOTTLES DRAWN AEROBIC AND ANAEROBIC Blood Culture results may not be optimal due to an inadequate volume of blood received in culture bottles   Culture   Final    NO GROWTH 4 DAYS Performed at [facility], [address], [place], [place] [address]    Report Status PENDING  Incomplete  Blood Culture (routine x 2)     Status: None (Preliminary result)   Collection Time: 09/06/24  2:43 PM   Specimen: BLOOD  Result Value Ref Range Status   Specimen Description BLOOD LEFT HAND  Final   Special Requests   Final    BOTTLES DRAWN AEROBIC AND ANAEROBIC Blood Culture results may not be optimal due to an inadequate volume of blood received in culture bottles   Culture   Final    NO GROWTH 4 DAYS Performed at [facility], [address] [place], [place] [address]    Report Status PENDING  Incomplete         Radiology Studies: No results found.       Scheduled Meds:  aspirin  EC  81 mg Oral Daily   atorvastatin   40 mg Oral QHS   calcium -vitamin D   1 tablet Oral BID   clopidogrel   75 mg Oral Daily   enoxaparin  (LOVENOX ) injection  40 mg Subcutaneous Q24H   gabapentin   300 mg Oral TID   ipratropium-albuterol   3 mL Nebulization TID   magnesium  oxide  400 mg Oral Daily   metoprolol  succinate  50 mg Oral Daily   montelukast   10 mg Oral Daily   pantoprazole   40 mg Oral BID   predniSONE   30 mg  Oral Q breakfast   sodium chloride  flush  3 mL Intravenous Q12H   Continuous Infusions:     LOS: 4 days    Time spent: 35 mins    Lorenz Donley Maree, MD [facility] Pager [phone]  If 7PM-7AM, please contact night-coverage [email or web address]  09/10/2024, 3:12 PM

## 2024-09-10 NOTE — Progress Notes (Signed)
 Physical Therapy Treatment Patient Details Name: Austin Patterson MRN: [id number] DOB: [birth year] Today's Date: 09/10/2024   History of Present Illness Austin Patterson is a 68 y.o. year old male with medical history of HTN, HLD, Stage IV Lung Cancer, Asthma, COPD and paroxymal atrial fibrillation presenting with weakness along with muscles aches, fevers and chills. He also reports coughing with some sputum production. MD dx include: Acute on chronic hypoxic respiratory failure, pulmonary fibrosis, HLD, GERD, and HFpEF.    PT Comments  Pt initially declined session due to fatigue then called nursing a few minutes later wanting to participate.  Went back into room and pt asking to try to walk.  Pt was down to 5 lpm regular cannula sats 90% at rest.  Pt transitioned to sitting with supervision and sats dec to 84-87% with difficulty maintaining about 88% despite cues to deep breathe and not talk to recover.  RN enters room and pt transitioned to HFCC green and increased to 7 lpm where he increases to 96%.  He stands for some light marching in place and walks a few steps forward and back but does self select to sit due to fatigue and SOB.  Sats decreased to 87% but does recover. He asks to walk to door and back and when reviewed with pt response to just a few steps he agreed that would not be a wise choice and sats would decrease to an uncomfortable range.  He opts to return to supine due to fatigue.     If plan is discharge home, recommend the following: A little help with walking and/or transfers;A little help with bathing/dressing/bathroom;Assistance with cooking/housework;Help with stairs or ramp for entrance   Can travel by private vehicle        Equipment Recommendations  None recommended by PT    Recommendations for Other Services       Precautions / Restrictions Precautions Precautions: Fall Recall of Precautions/Restrictions: Intact Restrictions Weight Bearing Restrictions Per  Provider Order: No     Mobility  Bed Mobility Overal bed mobility: Needs Assistance Bed Mobility: Sit to Supine, Supine to Sit     Supine to sit: Supervision, Used rails Sit to supine: Supervision, Used rails     Patient Response: Cooperative  Transfers Overall transfer level: Needs assistance Equipment used: Rolling walker (2 wheels) Transfers: Sit to/from Stand Sit to Stand: Contact guard assist, Min assist           General transfer comment: reports getting back to bed on his own after recliner this am    Ambulation/Gait Ambulation/Gait assistance: Contact guard assist, Min assist Gait Distance (Feet): 6 Feet Assistive device: Rolling walker (2 wheels) Gait Pattern/deviations: Step-through pattern, Decreased step length - right, Decreased step length - left Gait velocity: dec     General Gait Details: forward and back at EOB x 2 trials but limited due to fatigue   Stairs             Wheelchair Mobility     Tilt Bed Tilt Bed Patient Response: Cooperative  Modified Rankin (Stroke Patients Only)       Balance Overall balance assessment: Needs assistance Sitting-balance support: No upper extremity supported, Feet supported Sitting balance-Leahy Scale: Good     Standing balance support: Bilateral upper extremity supported Standing balance-Leahy Scale: Fair Standing balance comment: does well today with RW  Communication Communication Communication: No apparent difficulties  Cognition Arousal: Alert Behavior During Therapy: WFL for tasks assessed/performed   PT - Cognitive impairments: Safety/Judgement                       PT - Cognition Comments: asking to walk to door sespite fatigue and O2 sats dropping after just a few steps forward and back Following commands: Intact      Cueing Cueing Techniques: Verbal cues  Exercises      General Comments        Pertinent Vitals/Pain Pain  Assessment Pain Assessment: Faces Faces Pain Scale: Hurts little more Pain Location: R shoulder and back Pain Descriptors / Indicators: Sore Pain Intervention(s): Limited activity within patient's tolerance, Monitored during session, Repositioned    Home Living                          Prior Function            PT Goals (current goals can now be found in the care plan section) Progress towards PT goals: Progressing toward goals    Frequency    Min 2X/week      PT Plan      Co-evaluation              AM-PAC PT 6 Clicks Mobility   Outcome Measure  Help needed turning from your back to your side while in a flat bed without using bedrails?: None Help needed moving from lying on your back to sitting on the side of a flat bed without using bedrails?: A Little Help needed moving to and from a bed to a chair (including a wheelchair)?: A Little Help needed standing up from a chair using your arms (e.g., wheelchair or bedside chair)?: A Little Help needed to walk in hospital room?: A Little Help needed climbing 3-5 steps with a railing? : A Lot 6 Click Score: 18    End of Session Equipment Utilized During Treatment: Gait belt;Oxygen  Activity Tolerance: Patient tolerated treatment well;Treatment limited secondary to medical complications (Comment) Patient left: in bed;with bed alarm set;with call bell/phone within reach Nurse Communication: Mobility status PT Visit Diagnosis: Unsteadiness on feet (R26.81);Muscle weakness (generalized) (M62.81)     Time: 1201-1228 PT Time Calculation (min) (ACUTE ONLY): 27 min  Charges:    $Therapeutic Activity: 23-37 mins PT General Charges $$ ACUTE PT VISIT: 1 Visit                   Lauraine Gills, PTA 09/10/24, 1:11 PM

## 2024-09-10 NOTE — Progress Notes (Signed)
 Daily Progress Note   Patient Name: Austin Patterson       Date: 09/10/2024 DOB: 18-Oct-1956  Age: 68 y.o. MRN#: [id number] Attending Physician: Maree Hue, MD Primary Care Physician: Autry Grayce LABOR, PA Admit Date: 09/06/2024 Length of Stay: 4 days  Reason for Consultation/Follow-up: Establishing goals of care  Subjective:  Subjective: EMR reviewed including review of notes from hospitalist, TOC, and therapy.  Labs reviewed and notable for sodium 140, potassium 3.8, creatinine 0.89, WBC 2.5, hemoglobin stable at 8.6.  I saw Austin Patterson this morning.  He was transferred from bedside chair back in the bed with assistance from his nurse.  He reports he is, doing okay today.  Reviewed plan for transfer to facility for rehab and eventual follow-up with Dr. Babara.  Discussed pain in his shoulder and he reports that Bengay was the medication he was looking for and he feels that it continues to be beneficial.  Discussed also taking Tylenol  as he reports that he needs something else right now for his pain.  He also reports that he has been difficulty with constipation last night.  He will take Dulcolax at home on occasion.  He is not sure when he had last night.  Discussed  adding additional medication to his bowel regimen today on an as needed basis.  Review of Systems Shoulder pain, constipation Objective:   Vital Signs:  BP (!) 139/90   Pulse (!) 102   Temp 98.3 F (36.8 C) (Oral)   Resp 18   Ht 5' 7 (1.702 m)   Wt 81.3 kg   SpO2 91%   BMI 28.07 kg/m   Physical Exam: General: NAD, alert, mild increase work of breathing Eyes: conjunctiva clear, anicteric sclera HENT: normocephalic, atraumatic, moist mucous membranes Cardiovascular: RRR Respiratory: Rhonchi Skin: no rashes or lesions on visible skin Neuro: A&O, following commands easily Psych: appropriately answers all questions  Imaging: @IMAGES @  I personally reviewed recent imaging.   Assessment & Plan:    Assessment: Austin Patterson is a 68 year old male with medical history of hypertension, hyperlipidemia, stage IV lung cancer, asthma, COPD and paroxysmal atrial fibrillation who was admitted with pneumonia and COPD exacerbation after presenting with weakness, muscle aches, fever, and chills.  Recommendations/Plan: # Complex medical decision making/goals of care                - Continue current interventions.  He understands that his cancer is incurable.  He has a longstanding relationship with Dr. Babara and prefers to discuss new pulmonary nodules with her.  He is interested in continuing with cancer treatment understanding the intent of treatment is palliative.                - He is agreeable to rehab at the end of stay if necessary. He does not want to go to [facility].                - He has completed HCPOA paperwork naming son Austin Patterson) and daughter (Austin Patterson) as surrogates in that order.  Copy reviewed in ACP tab.                -  Code Status: Do not attempt resuscitation (DNR) PRE-ARREST INTERVENTIONS DESIRED  Prognosis: Unable to determine   # Symptom management Patient is receiving these palliative interventions for symptom management with an intent to improve quality of life.                 -  Shoulder pain: Reports Bengay has been helpful.  Tylenol  as needed  -  constipation: Increase senna S to 2 tabs twice daily as needed   # Psycho-social/Spiritual Support:  - Support System: 2 children - Desire for further Chaplain support:did not discuss today   # Discharge Planning:  SNF for rehab   Discussed with: patient  Thank you for allowing the palliative care team to participate in the care Deward FORBES Larri Amaryllis Oneita, MD Palliative Care Provider PMT # [phone]  If patient remains symptomatic despite maximum doses, please call PMT at [phone] between 0700 and 1900. Outside of these hours, please call attending, as PMT does not have night  coverage.   I personally spent a total of 30 minutes in the care of the patient today including preparing to see the patient, getting/reviewing separately obtained history, performing a medically appropriate exam/evaluation, placing orders, and documenting clinical information in the EHR.

## 2024-09-11 DIAGNOSIS — K219 Gastro-esophageal reflux disease without esophagitis: Secondary | ICD-10-CM | POA: Diagnosis not present

## 2024-09-11 DIAGNOSIS — I10 Essential (primary) hypertension: Secondary | ICD-10-CM | POA: Diagnosis not present

## 2024-09-11 DIAGNOSIS — I2510 Atherosclerotic heart disease of native coronary artery without angina pectoris: Secondary | ICD-10-CM | POA: Diagnosis not present

## 2024-09-11 DIAGNOSIS — J9621 Acute and chronic respiratory failure with hypoxia: Secondary | ICD-10-CM | POA: Diagnosis not present

## 2024-09-11 LAB — CBC
HCT: 30.5 % — ABNORMAL LOW (ref 39.0–52.0)
Hemoglobin: 9.2 g/dL — ABNORMAL LOW (ref 13.0–17.0)
MCH: 28 pg (ref 26.0–34.0)
MCHC: 30.2 g/dL (ref 30.0–36.0)
MCV: 93 fL (ref 80.0–100.0)
Platelets: 187 K/uL (ref 150–400)
RBC: 3.28 MIL/uL — ABNORMAL LOW (ref 4.22–5.81)
RDW: 17.1 % — ABNORMAL HIGH (ref 11.5–15.5)
WBC: 3.5 K/uL — ABNORMAL LOW (ref 4.0–10.5)
nRBC: 0.6 % — ABNORMAL HIGH (ref 0.0–0.2)

## 2024-09-11 LAB — BASIC METABOLIC PANEL WITH GFR
Anion gap: 7 (ref 5–15)
BUN: 20 mg/dL (ref 8–23)
CO2: 34 mmol/L — ABNORMAL HIGH (ref 22–32)
Calcium: 8.3 mg/dL — ABNORMAL LOW (ref 8.9–10.3)
Chloride: 100 mmol/L (ref 98–111)
Creatinine, Ser: 0.91 mg/dL (ref 0.61–1.24)
GFR, Estimated: >60 mL/min (ref >60)
Glucose, Bld: 129 mg/dL — ABNORMAL HIGH (ref 70–99)
Potassium: 4.6 mmol/L (ref 3.5–5.1)
Sodium: 141 mmol/L (ref 135–145)

## 2024-09-11 LAB — CULTURE, BLOOD (ROUTINE X 2)
Culture: NO GROWTH
Culture: NO GROWTH

## 2024-09-11 MED ORDER — IPRATROPIUM-ALBUTEROL 0.5-2.5 (3) MG/3ML IN SOLN
3.0000 mL | Freq: Two times a day (BID) | RESPIRATORY_TRACT | Status: DC
  Administered 2024-09-11 – 2024-09-12 (×2): 3 mL via RESPIRATORY_TRACT
  Filled 2024-09-11 (×2): qty 3

## 2024-09-11 MED ORDER — LIDOCAINE 5 % EX PTCH
1.0000 | MEDICATED_PATCH | CUTANEOUS | Status: DC
  Administered 2024-09-11 – 2024-09-12 (×2): 1 via TRANSDERMAL
  Filled 2024-09-11 (×2): qty 1

## 2024-09-11 MED ORDER — OXYCODONE HCL 5 MG PO TABS
5.0000 mg | ORAL_TABLET | Freq: Four times a day (QID) | ORAL | Status: DC | PRN
  Administered 2024-09-11 – 2024-09-12 (×2): 5 mg via ORAL
  Filled 2024-09-11 (×2): qty 1

## 2024-09-11 NOTE — Progress Notes (Signed)
 1034 Pt O2 sat with OT in room 79% on 4L. Increased to 6L  highflow sat 92%. MD made aware.

## 2024-09-11 NOTE — TOC Progression Note (Addendum)
 Transition of Care [facility]) - Progression Note    Patient Details  Name: Austin Patterson MRN: [id number] Date of Birth: [birth year]  [facility]) CM/SW Contact  Daved JONETTA Hamilton, RN Phone Number: 09/11/2024, 3:08 PM  Clinical Narrative:     Met with patient at bedside, introduced self and presented bed offers for SNF-STR. Patient selected [facility].  Selected [facility] in [place] and prior auth requested to be started.   Received notification from Nitchia with TOC:  Certified in Total   Certification#:[id number]   Dates: 11/24-11/30/25   Next Review Date: 09/17/24   Spoke with Montie at [facility], she will confirm bed is available and will reach back out to me to confirm.  Spoke with Montie at [facility], they can accept patient tomorrow, she is requesting d/c summary to be complete by 11am. Patient will go to room 134.                    Expected Discharge Plan and Services                                               Social Drivers of Health (SDOH) Interventions SDOH Screenings   Food Insecurity: No Food Insecurity (09/06/2024)  Housing: Low Risk  (09/06/2024)  Transportation Needs: No Transportation Needs (09/06/2024)  Utilities: Not At Risk (09/06/2024)  Depression (PHQ2-9): Low Risk  (09/06/2024)  Social Connections: Socially Isolated (09/06/2024)  Tobacco Use: Medium Risk (09/06/2024)    Readmission Risk Interventions    09/08/2024    2:15 PM 06/14/2023   11:19 AM  Readmission Risk Prevention Plan  Transportation Screening Complete Complete  PCP or Specialist Appt within 3-5 Days Complete   HRI or Home Care Consult Complete   Social Work Consult for Recovery Care Planning/Counseling Complete   Palliative Care Screening Not Applicable   Medication Review Ocean[facility]) Referral to Pharmacy Complete  PCP or Specialist appointment within 3-5 days of discharge  Complete  SW Recovery Care/Counseling Consult   Complete  Palliative Care Screening  Not Applicable  Skilled Nursing Facility  Not Applicable

## 2024-09-11 NOTE — Plan of Care (Signed)

## 2024-09-11 NOTE — Progress Notes (Signed)
 PROGRESS NOTE    Austin Patterson  FMW:[id number] DOB: 1956/01/20 DOA: 09/06/2024 PCP: Autry Grayce LABOR, PA   Brief Narrative:   68 y.o. year old male with medical history of HTN, HLD, Stage IV Lung Cancer, Asthma, COPD and paroxymal atrial fibrillation presenting to the ED with weakness along with muscles aches, fevers and chills   11/20: PT, OT eval, DC tele, transfer to med-surg 11/21: Lasix  80 mg IV once 11/22: Palliative care consult, tapering off prednisone  and cut back on oxycodone .  Added muscle rub cream 11/23: Tapering oxycodone  and prednisone .  Giving 80 mg of Lasix  IV x 1 11/24: Lasix  80 mg once   Assessment & Plan:   Principal Problem:   Acute on chronic hypoxic respiratory failure (HCC) Active Problems:   CAD (coronary artery disease)   Pulmonary fibrosis (HCC)   Essential hypertension   Hyperlipidemia   Asthma-COPD overlap syndrome (HCC)   GERD without esophagitis   Stage IV adenocarcinoma of lung (HCC)   Paroxysmal atrial fibrillation (HCC)   Acute on chronic hypoxic respiratory failure likely multifactorial with community acquired pneumonia and COPD exacerbation.   Completed course of Rocephin  & Zithromax . Continue to wean O2 -oxygen  requirement up to 6 L again this morning. At baseline uses 2-3 liters will give 80 mg of IV Lasix  once today   Hypertension: Continue Metoprolol    Hyperlipidemia: Continue home statin.   GERD: Continue home PPI   HFpEF: euvolemic on exam.  Last EF 2 months ago was 55%.   Paroxysmal A-fib: In sinus rhythm.   Stage IV adenocarcinoma of lung   [name].  Currently getting gemcitabine .  Goal is palliative in nature.  Patient with previous history of loculated right-sided effusion that was present and discussed on recent visit on 09/04/2024.  Shoulder pain: Muscle rub cream added by palliative care team.  Continue as needed oxycodone   GOC - Palliative care input appreciated   DVT prophylaxis:  Lovenox  Place and maintain sequential compression device Start: 09/07/24 1530 enoxaparin  (LOVENOX ) injection 40 mg Start: 09/06/24 2000     Code Status: DNR Family Communication: None at bedside Disposition Plan: possible D/C in 1-2 days depending on clinical condition.  Waiting for SNF placement     Antimicrobials: Completed Rocephin  Azithromycin     Subjective:  Oxygen  requirement went up to 6 L.  No other issues.  Feeling fine  Objective: Vitals:   09/11/24 0500 09/11/24 0757 09/11/24 0832 09/11/24 1057  BP:   (!) 144/82   Pulse:   92   Resp:   17   Temp:   97.6 F (36.4 C)   TempSrc:      SpO2:  95% (!) 89% 92%  Weight: 81.2 kg     Height:        Intake/Output Summary (Last 24 hours) at 09/11/2024 1510 Last data filed at 09/11/2024 1057 Gross per 24 hour  Intake 240 ml  Output 2575 ml  Net -2335 ml   Filed Weights   09/09/24 0456 09/10/24 0500 09/11/24 0500  Weight: 84.4 kg 81.3 kg 81.2 kg    Examination:  General exam: Appears calm and comfortable  Respiratory system: Rhonchi at the LLL bases Cardiovascular system: S1 & S2 heard, RRR. No pedal edema. Gastrointestinal system: Abdomen is soft, benign Central nervous system: Alert and oriented. No focal neurological deficits. Extremities: Symmetric 5 x 5 power. Skin: No rashes, lesions or ulcers Psychiatry: Judgement and insight appear normal. Mood & affect appropriate.  Data Reviewed: I have personally reviewed following labs and imaging studies  CBC: Recent Labs  Lab 09/06/24 1405 09/07/24 0332 09/08/24 0449 09/09/24 0400 09/10/24 0604 09/11/24 0258  WBC 9.1 7.8 7.6 5.6 2.5* 3.5*  NEUTROABS 8.7*  --   --   --   --   --   HGB 10.4* 9.2* 8.5* 8.5* 8.6* 9.2*  HCT 34.3* 30.1* 29.4* 28.2* 29.3* 30.5*  MCV 95.8 94.1 98.3 94.6 95.4 93.0  PLT 296 262 259 262 237 187   Basic Metabolic Panel: Recent Labs  Lab 09/06/24 1405 09/07/24 0332 09/08/24 0449 09/09/24 0400 09/10/24 0604  09/11/24 0258  NA 142 142 139 142 140 141  K 4.7 4.4 4.1 4.0 3.8 4.6  CL 104 104 103 100 100 100  CO2 25 27 31  36* 33* 34*  GLUCOSE 122* 141* 116* 94 90 129*  BUN 25* 23 26* 23 22 20   CREATININE 1.29* 1.23 1.08 1.10 0.89 0.91  CALCIUM  9.2 8.3* 8.0* 8.2* 8.0* 8.3*  MG 1.7  --   --   --   --   --    GFR: Estimated Creatinine Clearance: 79.2 mL/min (by C-G formula based on SCr of 0.91 mg/dL). Liver Function Tests: Recent Labs  Lab 09/06/24 1405  AST 32  ALT 15  ALKPHOS 134*  BILITOT 0.4  PROT 6.6  ALBUMIN 3.7   No results for input(s): LIPASE, AMYLASE in the last 168 hours. No results for input(s): AMMONIA in the last 168 hours. Coagulation Profile: Recent Labs  Lab 09/06/24 2008  INR 1.1    Sepsis Labs: Recent Labs  Lab 09/06/24 1405 09/06/24 1625  LATICACIDVEN 2.1* 1.1    Recent Results (from the past 240 hours)  Resp panel by RT-PCR (RSV, Flu A&B, Covid) Anterior Nasal Swab     Status: None   Collection Time: 09/06/24  2:37 PM   Specimen: Anterior Nasal Swab  Result Value Ref Range Status   SARS Coronavirus 2 by RT PCR NEGATIVE NEGATIVE Final    Comment: (NOTE) SARS-CoV-2 target nucleic acids are NOT DETECTED.  The SARS-CoV-2 RNA is generally detectable in upper respiratory specimens during the acute phase of infection. The lowest concentration of SARS-CoV-2 viral copies this assay can detect is 138 copies/mL. A negative result does not preclude SARS-Cov-2 infection and should not be used as the sole basis for treatment or other patient management decisions. A negative result may occur with  improper specimen collection/handling, submission of specimen other than nasopharyngeal swab, presence of viral mutation(s) within the areas targeted by this assay, and inadequate number of viral copies(<138 copies/mL). A negative result must be combined with clinical observations, patient history, and epidemiological information. The expected result is  Negative.  Fact Sheet for Patients:  bloggercourse.com  Fact Sheet for Healthcare Providers:  seriousbroker.it  This test is no t yet approved or cleared by the [place]  FDA and  has been authorized for detection and/or diagnosis of SARS-CoV-2 by FDA under an Emergency Use Authorization (EUA). This EUA will remain  in effect (meaning this test can be used) for the duration of the COVID-19 declaration under Section 564(b)(1) of the Act, 21 U.S.C.section 360bbb-3(b)(1), unless the authorization is terminated  or revoked sooner.       Influenza A by PCR NEGATIVE NEGATIVE Final   Influenza B by PCR NEGATIVE NEGATIVE Final    Comment: (NOTE) The Xpert Xpress SARS-CoV-2/FLU/RSV plus assay is intended as an aid in the diagnosis of influenza from Nasopharyngeal swab  specimens and should not be used as a sole basis for treatment. Nasal washings and aspirates are unacceptable for Xpert Xpress SARS-CoV-2/FLU/RSV testing.  Fact Sheet for Patients: bloggercourse.com  Fact Sheet for Healthcare Providers: seriousbroker.it  This test is not yet approved or cleared by the [place]  FDA and has been authorized for detection and/or diagnosis of SARS-CoV-2 by FDA under an Emergency Use Authorization (EUA). This EUA will remain in effect (meaning this test can be used) for the duration of the COVID-19 declaration under Section 564(b)(1) of the Act, 21 U.S.C. section 360bbb-3(b)(1), unless the authorization is terminated or revoked.     Resp Syncytial Virus by PCR NEGATIVE NEGATIVE Final    Comment: (NOTE) Fact Sheet for Patients: bloggercourse.com  Fact Sheet for Healthcare Providers: seriousbroker.it  This test is not yet approved or cleared by the [place]  FDA and has been authorized for detection and/or diagnosis of  SARS-CoV-2 by FDA under an Emergency Use Authorization (EUA). This EUA will remain in effect (meaning this test can be used) for the duration of the COVID-19 declaration under Section 564(b)(1) of the Act, 21 U.S.C. section 360bbb-3(b)(1), unless the authorization is terminated or revoked.  Performed at [facility], [address] [place], [place] [address]   Blood Culture (routine x 2)     Status: None   Collection Time: 09/06/24  2:38 PM   Specimen: BLOOD  Result Value Ref Range Status   Specimen Description BLOOD BLOOD RIGHT WRIST  Final   Special Requests   Final    BOTTLES DRAWN AEROBIC AND ANAEROBIC Blood Culture results may not be optimal due to an inadequate volume of blood received in culture bottles   Culture   Final    NO GROWTH 5 DAYS Performed at [facility], [address], [place], [place] [address]    Report Status 09/11/2024 FINAL  Final  Blood Culture (routine x 2)     Status: None   Collection Time: 09/06/24  2:43 PM   Specimen: BLOOD  Result Value Ref Range Status   Specimen Description BLOOD LEFT HAND  Final   Special Requests   Final    BOTTLES DRAWN AEROBIC AND ANAEROBIC Blood Culture results may not be optimal due to an inadequate volume of blood received in culture bottles   Culture   Final    NO GROWTH 5 DAYS Performed at [facility], [address], [place], [place] [address]    Report Status 09/11/2024 FINAL  Final         Radiology Studies: No results found.       Scheduled Meds:  aspirin  EC  81 mg Oral Daily   atorvastatin   40 mg Oral QHS   calcium -vitamin D   1 tablet Oral BID   clopidogrel   75 mg Oral Daily   enoxaparin  (LOVENOX ) injection  40 mg Subcutaneous Q24H   gabapentin   300 mg Oral TID   ipratropium-albuterol   3 mL Nebulization BID   lidocaine   1 patch Transdermal Q24H   magnesium  oxide  400 mg Oral Daily   metoprolol  succinate  50 mg Oral Daily   montelukast   10 mg Oral Daily    pantoprazole   40 mg Oral BID   sodium chloride  flush  3 mL Intravenous Q12H   Continuous Infusions:     LOS: 5 days    Time spent: 35 mins    Kumari Sculley Maree, MD [facility] Pager [phone]  If 7PM-7AM, please contact night-coverage [email or web address]  09/11/2024,  3:10 PM

## 2024-09-11 NOTE — Progress Notes (Signed)
 Occupational Therapy Treatment Patient Details Name: Austin Patterson MRN: [id number] DOB: 07-Jan-1956 Today's Date: 09/11/2024   History of present illness Austin Patterson is a 67 y.o. year old male with medical history of HTN, HLD, Stage IV Lung Cancer, Asthma, COPD and paroxymal atrial fibrillation presenting with weakness along with muscles aches, fevers and chills. He also reports coughing with some sputum production. MD dx include: Acute on chronic hypoxic respiratory failure, pulmonary fibrosis, HLD, GERD, and HFpEF.   OT comments  Pt received seated at EOB. Appearing alert; willing to work with OT on functional mobility and activity tolerance. T/f with arm rests of chair CGA to standing at RW; on 6-8 L O2 throughout session. . See flowsheet below for further details of session. Left semi-reclined in bed with all needs in reach. RN made aware that bed alarm is off so that pt can sit on EOB for using urinal as needed.  Patient will benefit from continued OT while in acute care.       If plan is discharge home, recommend the following:  A little help with walking and/or transfers;A lot of help with bathing/dressing/bathroom;Assistance with cooking/housework;Assist for transportation;Help with stairs or ramp for entrance   Equipment Recommendations  Other (comment) (defer to next venue of care)    Recommendations for Other Services      Precautions / Restrictions Precautions Precautions: Fall Recall of Precautions/Restrictions: Intact Precaution/Restrictions Comments: watch O2 Restrictions Weight Bearing Restrictions Per Provider Order: No       Mobility Bed Mobility Overal bed mobility: Modified Independent Bed Mobility: Sit to Supine       Sit to supine: Modified independent (Device/Increase time)        Transfers Overall transfer level: Needs assistance Equipment used: Rolling walker (2 wheels) Transfers: Sit to/from Stand Sit to Stand: Contact guard assist            General transfer comment: using rails on chair. RW for mobility.     Balance Overall balance assessment: Needs assistance Sitting-balance support: No upper extremity supported, Feet supported Sitting balance-Leahy Scale: Good     Standing balance support: Bilateral upper extremity supported Standing balance-Leahy Scale: Fair Standing balance comment: using RW well.                           ADL either performed or assessed with clinical judgement   ADL Overall ADL's : Needs assistance/impaired                                       General ADL Comments: Pt used urinal independently twice during session. Significant ADL impairment 2/2 decreased activity tolerance and O2 dropping with minimal functional standing/mobility. UE functional during session. Pt requiring arm rests of chair to t/f sit to stand.    Extremity/Trunk Assessment Upper Extremity Assessment Upper Extremity Assessment: Generalized weakness   Lower Extremity Assessment Lower Extremity Assessment: Generalized weakness        Vision       Perception     Praxis     Communication Communication Communication: No apparent difficulties   Cognition Arousal: Alert Behavior During Therapy: WFL for tasks assessed/performed Cognition: No apparent impairments, No family/caregiver present to determine baseline             OT - Cognition Comments: Some decreased insight into how his O2 will drop significantly with  too much activity; wanted to push forward anyway.                 Following commands: Intact        Cueing   Cueing Techniques: Verbal cues  Exercises      Shoulder Instructions       General Comments Pt on 4L O2 when OT arrived ([facility]NC); 80% saturation; RN called to room. RN bumped O2 to 6L, which pt recovered to 9294%, HR 102. Pt agreeable to participate in functional mobility for increasing activity tolerance. O2 tank obtained. On 6L pt walked  12 ft and then sat in chair, O2 dropped to 84% on 6L. Took 4 minutes and still not recovering well; OT bumped O2 to 8L; recovered to 92% with rest. Pt wanting to walk to door; OT provided education on that distance being a little too far, but pt wishing to continue. Pt walked approx 25 feet on *L. O2 dropped to 82. Took 3 minutes to recover to 94% with seated rest; one additional minute to 96%. Pt then returned to bed, placed on 6L in semi-reclined, O2 92% when OT left the room. RN made aware of pt status. Pt used urinal while seated twice during session.    Pertinent Vitals/ Pain       Pain Assessment Pain Assessment: 0-10 Pain Score:  (unrated; mild) Pain Location: low back Pain Descriptors / Indicators: Sore Pain Intervention(s): Limited activity within patient's tolerance, Repositioned (RN gave pain patch)  Home Living                                          Prior Functioning/Environment              Frequency  Min 2X/week        Progress Toward Goals  OT Goals(current goals can now be found in the care plan section)  Progress towards OT goals: Progressing toward goals  Acute Rehab OT Goals Patient Stated Goal: get better OT Goal Formulation: With patient Time For Goal Achievement: 09/21/24 Potential to Achieve Goals: Good ADL Goals Pt Will Perform Lower Body Dressing: sit to/from stand;with modified independence;sitting/lateral leans;with adaptive equipment Pt Will Transfer to Toilet: ambulating;bedside commode;with supervision Pt Will Perform Toileting - Clothing Manipulation and hygiene: with modified independence;sitting/lateral leans Additional ADL Goal #1: Pt will utitlize learned ECS to support breathing, 5/5 opportunties during ADL/mobility.  Plan      Co-evaluation                 AM-PAC OT 6 Clicks Daily Activity     Outcome Measure   Help from another person eating meals?: None Help from another person taking care of  personal grooming?: A Little Help from another person toileting, which includes using toliet, bedpan, or urinal?: A Little Help from another person bathing (including washing, rinsing, drying)?: A Little Help from another person to put on and taking off regular upper body clothing?: None Help from another person to put on and taking off regular lower body clothing?: A Little 6 Click Score: 20    End of Session Equipment Utilized During Treatment: Oxygen ;Rolling walker (2 wheels)  OT Visit Diagnosis: Unsteadiness on feet (R26.81);Muscle weakness (generalized) (M62.81)   Activity Tolerance Patient tolerated treatment well   Patient Left in bed;with call bell/phone within reach   Nurse Communication Mobility status  Time: 8944-8860 OT Time Calculation (min): 44 min  Charges: OT General Charges $OT Visit: 1 Visit OT Treatments $Therapeutic Activity: 38-52 mins  Jasmine Arlean Shams, MS, OTR/L   Jasmine Shams 09/11/2024, 11:46 AM

## 2024-09-12 DIAGNOSIS — K219 Gastro-esophageal reflux disease without esophagitis: Secondary | ICD-10-CM | POA: Diagnosis not present

## 2024-09-12 DIAGNOSIS — I10 Essential (primary) hypertension: Secondary | ICD-10-CM | POA: Diagnosis not present

## 2024-09-12 DIAGNOSIS — I2510 Atherosclerotic heart disease of native coronary artery without angina pectoris: Secondary | ICD-10-CM | POA: Diagnosis not present

## 2024-09-12 DIAGNOSIS — J9621 Acute and chronic respiratory failure with hypoxia: Secondary | ICD-10-CM | POA: Diagnosis not present

## 2024-09-12 LAB — CBC
HCT: 32.4 % — ABNORMAL LOW (ref 39.0–52.0)
Hemoglobin: 9.8 g/dL — ABNORMAL LOW (ref 13.0–17.0)
MCH: 28.2 pg (ref 26.0–34.0)
MCHC: 30.2 g/dL (ref 30.0–36.0)
MCV: 93.4 fL (ref 80.0–100.0)
Platelets: 185 K/uL (ref 150–400)
RBC: 3.47 MIL/uL — ABNORMAL LOW (ref 4.22–5.81)
RDW: 16.9 % — ABNORMAL HIGH (ref 11.5–15.5)
WBC: 6.7 K/uL (ref 4.0–10.5)
nRBC: 0.7 % — ABNORMAL HIGH (ref 0.0–0.2)

## 2024-09-12 LAB — BASIC METABOLIC PANEL WITH GFR
Anion gap: 8 (ref 5–15)
BUN: 25 mg/dL — ABNORMAL HIGH (ref 8–23)
CO2: 35 mmol/L — ABNORMAL HIGH (ref 22–32)
Calcium: 8.5 mg/dL — ABNORMAL LOW (ref 8.9–10.3)
Chloride: 95 mmol/L — ABNORMAL LOW (ref 98–111)
Creatinine, Ser: 0.93 mg/dL (ref 0.61–1.24)
GFR, Estimated: >60 mL/min (ref >60)
Glucose, Bld: 101 mg/dL — ABNORMAL HIGH (ref 70–99)
Potassium: 3.8 mmol/L (ref 3.5–5.1)
Sodium: 138 mmol/L (ref 135–145)

## 2024-09-12 LAB — GLUCOSE, CAPILLARY: Glucose-Capillary: 95 mg/dL (ref 70–99)

## 2024-09-12 MED ORDER — FUROSEMIDE 10 MG/ML IJ SOLN
80.0000 mg | INTRAMUSCULAR | Status: AC
  Administered 2024-09-12: 80 mg via INTRAVENOUS
  Filled 2024-09-12: qty 8

## 2024-09-12 MED ORDER — MUSCLE RUB 10-15 % EX CREA: 1.0000 | TOPICAL_CREAM | Freq: Four times a day (QID) | CUTANEOUS | Status: AC | PRN

## 2024-09-12 MED ORDER — GUAIFENESIN-DM 100-10 MG/5ML PO SYRP: 5.0000 mL | ORAL_SOLUTION | ORAL | Status: DC | PRN

## 2024-09-12 MED ORDER — SENNOSIDES-DOCUSATE SODIUM 8.6-50 MG PO TABS: 2.0000 | ORAL_TABLET | Freq: Every evening | ORAL | Status: DC | PRN

## 2024-09-12 NOTE — TOC Transition Note (Signed)
 Transition of Care [facility]) - Discharge Note   Patient Details  Name: Austin Patterson MRN: [id number] Date of Birth: June 10, 1956  [facility] CM/SW Contact:  Alvaro Louder, LCSW Phone Number: 09/12/2024, 3:17 PM   Clinical Narrative:   LCSWA received insurance approval for patient to admit to SNF [facility]. LCSWA confirmed with MD that patient is stable for discharge. LCSWA notified the patient and they are in agreement with discharge. LCSWA confirmed bed is available at SNF. Transport arranged with [facility] for next available.  RM: [phone] Number to call report [phone]   [facility] to follow for discharge  Final next level of care: Skilled Nursing Facility Barriers to Discharge: No Barriers Identified   Patient Goals and CMS Choice            Discharge Placement              Patient chooses bed at: [facility] [place] Patient to be transferred to facility by: [facility] Name of family member notified: Self Patient and family notified of of transfer: 09/12/24  Discharge Plan and Services Additional resources added to the After Visit Summary for                                       Social Drivers of Health (SDOH) Interventions SDOH Screenings   Food Insecurity: No Food Insecurity (09/06/2024)  Housing: Low Risk  (09/06/2024)  Transportation Needs: No Transportation Needs (09/06/2024)  Utilities: Not At Risk (09/06/2024)  Depression (PHQ2-9): Low Risk  (09/06/2024)  Social Connections: Socially Isolated (09/06/2024)  Tobacco Use: Medium Risk (09/06/2024)     Readmission Risk Interventions    09/08/2024    2:15 PM 06/14/2023   11:19 AM  Readmission Risk Prevention Plan  Transportation Screening Complete Complete  PCP or Specialist Appt within 3-5 Days Complete   HRI or Home Care Consult Complete   Social Work Consult for Recovery Care Planning/Counseling Complete   Palliative Care Screening Not Applicable   Medication Review Special Educational Needs Teacher) Referral to Pharmacy Complete  PCP or Specialist appointment within 3-5 days of discharge  Complete  SW Recovery Care/Counseling Consult  Complete  Palliative Care Screening  Not Applicable  Skilled Nursing Facility  Not Applicable

## 2024-09-12 NOTE — Discharge Summary (Signed)
 Physician Discharge Summary   Patient: Austin Patterson MRN: [id number] DOB: 13-Sep-1956  Admit date:     09/06/2024  Discharge date: 09/12/24  Discharge Physician: Cresencio Fairly   PCP: Autry Grayce LABOR, PA   Recommendations at discharge:   Follow-up with outpatient providers as requested  Discharge Diagnoses: Principal Problem:   Acute on chronic hypoxic respiratory failure (HCC) Active Problems:   CAD (coronary artery disease)   Pulmonary fibrosis (HCC)   Essential hypertension   Hyperlipidemia   Asthma-COPD overlap syndrome (HCC)   GERD without esophagitis   Stage IV adenocarcinoma of lung (HCC)   Paroxysmal atrial fibrillation [facility])  Hospital Course: Assessment and Plan:   68 y.o. year old male with medical history of HTN, HLD, Stage IV Lung Cancer, Asthma, COPD and paroxymal atrial fibrillation presenting to the ED with weakness along with muscles aches, fevers and chills    11/20: PT, OT eval, DC tele, transfer to med-surg 11/21: Lasix  80 mg IV once 11/22: Palliative care consult, tapering off prednisone  and cut back on oxycodone .  Added muscle rub cream 11/23: Tapering oxycodone  and prednisone .  Giving 80 mg of Lasix  IV x 1 11/24: Lasix  80 mg once 11/25: Lasix  80 mg once and resume home dose     Acute on chronic hypoxic respiratory failure likely multifactorial with community acquired pneumonia and COPD exacerbation.   Completed course of Rocephin  & Zithromax .  Will need 3-4 L oxygen  upon discharge   Hypertension: Continue Metoprolol  and Norvasc    Hyperlipidemia: Continue home statin.   GERD: Continue home PPI   HFpEF: euvolemic on exam.  Last EF 2 months ago was 55%.   Paroxysmal A-fib: In sinus rhythm.  Continue metoprolol  for rate control   Stage IV adenocarcinoma of lung   [name].  Currently getting gemcitabine .  Goal is palliative in nature.  Patient with previous history of loculated right-sided effusion that was present and  discussed on recent visit on 09/04/2024. Overall poor prognosis   Shoulder pain: Muscle rub cream added by palliative care team.  Continue at discharge   GOC -overall poor prognosis       Consultants: Palliative care  Disposition: Skilled nursing facility with palliative care to follow Diet recommendation:  Discharge Diet Orders (From admission, onward)     Start     Ordered   09/12/24 0000  Diet - low sodium heart healthy        09/12/24 1156           Carb modified diet DISCHARGE MEDICATION: Allergies as of 09/12/2024       Reactions   Amoxicillin  Anaphylaxis   Tizanidine     Feet and ankle swell         Medication List     STOP taking these medications    AeroChamber MV inhaler   azithromycin  500 MG tablet Commonly known as: ZITHROMAX    EPINEPHrine  0.3 mg/0.3 mL Soaj injection Commonly known as: EPI-PEN   ferrous sulfate  325 (65 FE) MG tablet   fluticasone -salmeterol 115-21 MCG/ACT inhaler Commonly known as: Advair  HFA   guaiFENesin -codeine  100-10 MG/5ML syrup   omega-3 acid ethyl esters 1 g capsule Commonly known as: LOVAZA   oxyCODONE  5 MG immediate release tablet Commonly known as: Oxy IR/ROXICODONE    potassium chloride  SA 20 MEQ tablet Commonly known as: KLOR-CON  M   sucralfate  1 g tablet Commonly known as: Carafate        TAKE these medications    acetaminophen  500 MG tablet  Commonly known as: TYLENOL  Take 1,000 mg by mouth every 6 (six) hours as needed for moderate pain or headache.   albuterol  108 (90 Base) MCG/ACT inhaler Commonly known as: VENTOLIN  HFA Inhale 2 puffs into the lungs every 6 (six) hours as needed for wheezing or shortness of breath. What changed: Another medication with the same name was removed. Continue taking this medication, and follow the directions you see here.   amLODipine  5 MG tablet Commonly known as: NORVASC  Take 1 tablet (5 mg total) by mouth at bedtime. Skip the dose if systolic BP less than  130 mmHg   aspirin  EC 81 MG tablet Take 1 tablet (81 mg total) by mouth daily.   atorvastatin  40 MG tablet Commonly known as: LIPITOR Take 1 tablet (40 mg total) by mouth at bedtime. What changed: when to take this   Calcium  600/Vitamin D  600-10 MG-MCG Tabs Generic drug: Calcium  Carb-Cholecalciferol  Take 1 tablet by mouth 2 (two) times daily.   clopidogrel  75 MG tablet Commonly known as: PLAVIX  Take 1 tablet (75 mg total) by mouth daily.   Dupixent  300 MG/2ML Soaj Generic drug: Dupilumab  Inject 300 mg into the skin every 14 (fourteen) days.   furosemide  20 MG tablet Commonly known as: LASIX  Take 1 tablet (20 mg total) by mouth daily.   gabapentin  300 MG capsule Commonly known as: NEURONTIN  TAKE 1 CAPSULE BY MOUTH THREE TIMES A DAY   guaiFENesin -dextromethorphan  100-10 MG/5ML syrup Commonly known as: ROBITUSSIN DM Take 5 mLs by mouth every 4 (four) hours as needed for cough (chest congestion).   ipratropium-albuterol  0.5-2.5 (3) MG/3ML Soln Commonly known as: DUONEB INHALE 1 VIAL THROUGH NEBULIZER EVERY 6 HOURS   lidocaine -prilocaine  cream Commonly known as: EMLA  Apply to affected area once   magnesium  oxide 400 (241.3 Mg) MG tablet Commonly known as: MAG-OX Take 1 tablet (400 mg total) by mouth daily.   metoprolol  succinate 50 MG 24 hr tablet Commonly known as: TOPROL -XL Take 50 mg by mouth daily.   montelukast  10 MG tablet Commonly known as: SINGULAIR  Take 10 mg by mouth daily.   multivitamin with minerals Tabs tablet Take 1 tablet by mouth daily.   Muscle Rub 10-15 % Crea Apply 1 Application topically every 6 (six) hours as needed for muscle pain.   pantoprazole  40 MG tablet Commonly known as: PROTONIX  Take 1 tablet (40 mg total) by mouth 2 (two) times daily. What changed: when to take this   predniSONE  10 MG tablet Commonly known as: DELTASONE  TAKE 1 TABLET (10 MG TOTAL) BY MOUTH DAILY AS NEEDED. TAKE AS DIRECTED.   senna-docusate 8.6-50 MG  tablet Commonly known as: Senokot-S Take 2 tablets by mouth at bedtime as needed for mild constipation.        Contact information for follow-up providers     Autry, Robin A, PA Follow up.   Specialty: Physician Assistant Why: hospital follow up Contact information: [address] [place] [address] [phone]              Contact information for after-discharge care     Destination     [facility] [place], [place]. [place]   Service: Skilled Nursing Contact information: [address] [place]  [phone]                    Discharge Exam: Filed Weights   09/10/24 0500 09/11/24 0500 09/12/24 0500  Weight: 81.3 kg 81.2 kg 79.2 kg   General exam: Appears calm and comfortable  Respiratory system: Rhonchi at the LLL bases Cardiovascular system: S1 & S2 heard, RRR. No pedal edema. Gastrointestinal system: Abdomen is soft, benign Central nervous system: Alert and oriented. No focal neurological deficits. Extremities: Symmetric 5 x 5 power. Skin: No rashes, lesions or ulcers Psychiatry: Judgement and insight appear normal. Mood & affect appropriate.   Condition at discharge: fair  The results of significant diagnostics from this hospitalization (including imaging, microbiology, ancillary and laboratory) are listed below for reference.   Imaging Studies: CT CHEST WO CONTRAST Result Date: 09/06/2024 CLINICAL DATA:  Pneumonia.  History of lung cancer. EXAM: CT CHEST WITHOUT CONTRAST TECHNIQUE: Multidetector CT imaging of the chest was performed following the standard protocol without IV contrast. RADIATION DOSE REDUCTION: This exam was performed according to the departmental dose-optimization program which includes automated exposure control, adjustment of the mA and/or kV according to patient size and/or use of iterative reconstruction technique. COMPARISON:  Chest x-ray same day.  CT angiogram chest 07/13/2024. FINDINGS:  Cardiovascular: There is a trace pericardial effusion, unchanged. Heart is mildly enlarged. Aorta is normal in size. There are atherosclerotic calcifications of the aorta and coronary arteries. Left chest port catheter tip ends in the SVC. Mediastinum/Nodes: No enlarged mediastinal or axillary lymph nodes. Thyroid  gland, trachea, and esophagus demonstrate no significant findings. Lungs/Pleura: Left pleural effusion has nearly completely resolved. Small to moderate-sized right pleural effusion is unchanged, partially loculated. Severe emphysema again seen with large bulla in the right lower hemithorax. There are numerous new bilateral pulmonary nodules, more significant surrounding the lung lung measuring up to 14 mm. There is a new nodular mass in the right lung base measuring 2.6 cm image 3/110. Masslike density in the anterior right lower hemithorax extending to the hilum appears unchanged. There is no new focal lung infiltrate. Upper Abdomen: No acute abnormality. Musculoskeletal: There are vertebroplasty changes are similar to prior. No new fractures are identified. IMPRESSION: 1. New bilateral pulmonary nodules worrisome for metastatic disease. 2. Stable masslike density in the anterior right lower hemithorax extending to the hilum. 3. Stable small to moderate-sized right pleural effusion, partially loculated. 4. Near complete resolution of left pleural effusion. 5. Stable trace pericardial effusion. Aortic Atherosclerosis (ICD10-I70.0) and Emphysema (ICD10-J43.9). Electronically Signed   By: Greig Pique M.D.   On: 09/06/2024 19:46   DG Chest Port 1 View Result Date: 09/06/2024 EXAM: 1 VIEW(S) XRAY OF THE CHEST 09/06/2024 02:55:00 PM COMPARISON: 08/23/2024 CLINICAL HISTORY: Oxygen  dependent [id number] FINDINGS: LINES, TUBES AND DEVICES: Left chest port with tip at SVC. LUNGS AND PLEURA: The known right middle lobe and medial left lung base lesions are suboptimally visualized on the current radiograph. Again  seen is diffuse interstitial prominence with progressive patchy nodular densities in the left base, which may reflect aspiration or pneumonia. Small to moderate right pleural effusion is slightly increased in the interval. No pneumothorax. HEART AND MEDIASTINUM: No acute abnormality of the cardiac and mediastinal silhouettes. BONES AND SOFT TISSUES: No acute osseous abnormality. IMPRESSION: 1. Diffuse interstitial prominence with progressive patchy nodular densities in the left lung base, possibly reflecting aspiration or pneumonia. 2. Small to moderate right pleural effusion, slightly increased in the interval. Electronically signed by: Waddell Calk MD 09/06/2024 03:48 PM EST RP Workstation: [id number]   DG Chest 2 View Result Date: 08/25/2024 EXAM: 2 VIEW(S) XRAY OF THE CHEST 08/23/2024 12:43:00 PM COMPARISON: 07/14/2024 CLINICAL HISTORY: lung cancer, pleural effusion follow up FINDINGS: LINES, TUBES AND DEVICES: Left internal jugular port-a-cath is unchanged. LUNGS AND PLEURA: Right pleural effusion  is again noted with associated right basilar atelectasis or infiltrate. No pulmonary edema. No pneumothorax. HEART AND MEDIASTINUM: No acute abnormality of the cardiac and mediastinal silhouettes. BONES AND SOFT TISSUES: No acute osseous abnormality. IMPRESSION: 1. Small right pleural effusion with associated right basilar atelectasis or infiltrate. Electronically signed by: Lynwood Seip MD 08/25/2024 01:12 PM EST RP Workstation: [id number]    Microbiology: Results for orders placed or performed during the hospital encounter of 09/06/24  Resp panel by RT-PCR (RSV, Flu A&B, Covid) Anterior Nasal Swab     Status: None   Collection Time: 09/06/24  2:37 PM   Specimen: Anterior Nasal Swab  Result Value Ref Range Status   SARS Coronavirus 2 by RT PCR NEGATIVE NEGATIVE Final    Comment: (NOTE) SARS-CoV-2 target nucleic acids are NOT DETECTED.  The SARS-CoV-2 RNA is generally detectable in upper  respiratory specimens during the acute phase of infection. The lowest concentration of SARS-CoV-2 viral copies this assay can detect is 138 copies/mL. A negative result does not preclude SARS-Cov-2 infection and should not be used as the sole basis for treatment or other patient management decisions. A negative result may occur with  improper specimen collection/handling, submission of specimen other than nasopharyngeal swab, presence of viral mutation(s) within the areas targeted by this assay, and inadequate number of viral copies(<138 copies/mL). A negative result must be combined with clinical observations, patient history, and epidemiological information. The expected result is Negative.  Fact Sheet for Patients:  bloggercourse.com  Fact Sheet for Healthcare Providers:  seriousbroker.it  This test is no t yet approved or cleared by the [place]  FDA and  has been authorized for detection and/or diagnosis of SARS-CoV-2 by FDA under an Emergency Use Authorization (EUA). This EUA will remain  in effect (meaning this test can be used) for the duration of the COVID-19 declaration under Section 564(b)(1) of the Act, 21 U.S.C.section 360bbb-3(b)(1), unless the authorization is terminated  or revoked sooner.       Influenza A by PCR NEGATIVE NEGATIVE Final   Influenza B by PCR NEGATIVE NEGATIVE Final    Comment: (NOTE) The Xpert Xpress SARS-CoV-2/FLU/RSV plus assay is intended as an aid in the diagnosis of influenza from Nasopharyngeal swab specimens and should not be used as a sole basis for treatment. Nasal washings and aspirates are unacceptable for Xpert Xpress SARS-CoV-2/FLU/RSV testing.  Fact Sheet for Patients: bloggercourse.com  Fact Sheet for Healthcare Providers: seriousbroker.it  This test is not yet approved or cleared by the [place]  FDA and has been  authorized for detection and/or diagnosis of SARS-CoV-2 by FDA under an Emergency Use Authorization (EUA). This EUA will remain in effect (meaning this test can be used) for the duration of the COVID-19 declaration under Section 564(b)(1) of the Act, 21 U.S.C. section 360bbb-3(b)(1), unless the authorization is terminated or revoked.     Resp Syncytial Virus by PCR NEGATIVE NEGATIVE Final    Comment: (NOTE) Fact Sheet for Patients: bloggercourse.com  Fact Sheet for Healthcare Providers: seriousbroker.it  This test is not yet approved or cleared by the [place]  FDA and has been authorized for detection and/or diagnosis of SARS-CoV-2 by FDA under an Emergency Use Authorization (EUA). This EUA will remain in effect (meaning this test can be used) for the duration of the COVID-19 declaration under Section 564(b)(1) of the Act, 21 U.S.C. section 360bbb-3(b)(1), unless the authorization is terminated or revoked.  Performed at [facility], [address], [place], [place] [address]   Blood Culture (  routine x 2)     Status: None   Collection Time: 09/06/24  2:38 PM   Specimen: BLOOD  Result Value Ref Range Status   Specimen Description BLOOD BLOOD RIGHT WRIST  Final   Special Requests   Final    BOTTLES DRAWN AEROBIC AND ANAEROBIC Blood Culture results may not be optimal due to an inadequate volume of blood received in culture bottles   Culture   Final    NO GROWTH 5 DAYS Performed at [facility], [address] [place], [place] [address]    Report Status 09/11/2024 FINAL  Final  Blood Culture (routine x 2)     Status: None   Collection Time: 09/06/24  2:43 PM   Specimen: BLOOD  Result Value Ref Range Status   Specimen Description BLOOD LEFT HAND  Final   Special Requests   Final    BOTTLES DRAWN AEROBIC AND ANAEROBIC Blood Culture results may not be optimal due to an inadequate volume of blood received  in culture bottles   Culture   Final    NO GROWTH 5 DAYS Performed at [facility], [address] [place], [place] [address]    Report Status 09/11/2024 FINAL  Final    Labs: CBC: Recent Labs  Lab 09/06/24 1405 09/07/24 0332 09/08/24 0449 09/09/24 0400 09/10/24 0604 09/11/24 0258 09/12/24 0421  WBC 9.1   < > 7.6 5.6 2.5* 3.5* 6.7  NEUTROABS 8.7*  --   --   --   --   --   --   HGB 10.4*   < > 8.5* 8.5* 8.6* 9.2* 9.8*  HCT 34.3*   < > 29.4* 28.2* 29.3* 30.5* 32.4*  MCV 95.8   < > 98.3 94.6 95.4 93.0 93.4  PLT 296   < > 259 262 237 187 185   < > = values in this interval not displayed.   Basic Metabolic Panel: Recent Labs  Lab 09/06/24 1405 09/07/24 0332 09/08/24 0449 09/09/24 0400 09/10/24 0604 09/11/24 0258 09/12/24 0421  NA 142   < > 139 142 140 141 138  K 4.7   < > 4.1 4.0 3.8 4.6 3.8  CL 104   < > 103 100 100 100 95*  CO2 25   < > 31 36* 33* 34* 35*  GLUCOSE 122*   < > 116* 94 90 129* 101*  BUN 25*   < > 26* 23 22 20  25*  CREATININE 1.29*   < > 1.08 1.10 0.89 0.91 0.93  CALCIUM  9.2   < > 8.0* 8.2* 8.0* 8.3* 8.5*  MG 1.7  --   --   --   --   --   --    < > = values in this interval not displayed.   Liver Function Tests: Recent Labs  Lab 09/06/24 1405  AST 32  ALT 15  ALKPHOS 134*  BILITOT 0.4  PROT 6.6  ALBUMIN 3.7   CBG: Recent Labs  Lab 09/08/24 0810 09/08/24 1133 09/09/24 0724 09/10/24 0840 09/12/24 0821  GLUCAP 89 168* 81 86 95    Discharge time spent: greater than 30 minutes.  Signed: Cresencio Fairly, MD [facility] 09/12/2024

## 2024-09-12 NOTE — Progress Notes (Signed)
 [facility] Room 152 [facility] Liaison Note  Referral received from [facility], Antion Johnson, LCSW, for palliative care follow up at [facility].    Referral submitted today.   Please call with any palliative care questions or concerns.  Thank you for the opportunity to participate in this patient's care.  [facility] Liaison [phone]

## 2024-09-13 ENCOUNTER — Other Ambulatory Visit: Payer: Self-pay

## 2024-09-13 ENCOUNTER — Inpatient Hospital Stay

## 2024-09-13 ENCOUNTER — Emergency Department

## 2024-09-13 ENCOUNTER — Inpatient Hospital Stay
Admission: EM | Admit: 2024-09-13 | Discharge: 2024-09-20 | DRG: 871 | Disposition: A | Source: Skilled Nursing Facility | Attending: Internal Medicine | Admitting: Internal Medicine

## 2024-09-13 DIAGNOSIS — E878 Other disorders of electrolyte and fluid balance, not elsewhere classified: Secondary | ICD-10-CM | POA: Insufficient documentation

## 2024-09-13 DIAGNOSIS — J9601 Acute respiratory failure with hypoxia: Secondary | ICD-10-CM | POA: Diagnosis not present

## 2024-09-13 DIAGNOSIS — R4182 Altered mental status, unspecified: Secondary | ICD-10-CM

## 2024-09-13 DIAGNOSIS — J90 Pleural effusion, not elsewhere classified: Secondary | ICD-10-CM | POA: Diagnosis present

## 2024-09-13 DIAGNOSIS — J9621 Acute and chronic respiratory failure with hypoxia: Secondary | ICD-10-CM | POA: Diagnosis present

## 2024-09-13 DIAGNOSIS — J189 Pneumonia, unspecified organism: Secondary | ICD-10-CM | POA: Diagnosis not present

## 2024-09-13 DIAGNOSIS — Z515 Encounter for palliative care: Secondary | ICD-10-CM | POA: Diagnosis not present

## 2024-09-13 DIAGNOSIS — E663 Overweight: Secondary | ICD-10-CM | POA: Diagnosis present

## 2024-09-13 DIAGNOSIS — C3490 Malignant neoplasm of unspecified part of unspecified bronchus or lung: Secondary | ICD-10-CM | POA: Diagnosis not present

## 2024-09-13 DIAGNOSIS — A419 Sepsis, unspecified organism: Principal | ICD-10-CM | POA: Diagnosis present

## 2024-09-13 DIAGNOSIS — I480 Paroxysmal atrial fibrillation: Secondary | ICD-10-CM | POA: Diagnosis present

## 2024-09-13 DIAGNOSIS — I5033 Acute on chronic diastolic (congestive) heart failure: Secondary | ICD-10-CM | POA: Insufficient documentation

## 2024-09-13 LAB — COMPREHENSIVE METABOLIC PANEL WITH GFR
ALT: 92 U/L — ABNORMAL HIGH (ref 0–44)
AST: 68 U/L — ABNORMAL HIGH (ref 15–41)
Albumin: 3.8 g/dL (ref 3.5–5.0)
Alkaline Phosphatase: 208 U/L — ABNORMAL HIGH (ref 38–126)
Anion gap: 11 (ref 5–15)
BUN: 29 mg/dL — ABNORMAL HIGH (ref 8–23)
CO2: 33 mmol/L — ABNORMAL HIGH (ref 22–32)
Calcium: 9 mg/dL (ref 8.9–10.3)
Chloride: 94 mmol/L — ABNORMAL LOW (ref 98–111)
Creatinine, Ser: 0.96 mg/dL (ref 0.61–1.24)
GFR, Estimated: >60 mL/min (ref >60)
Glucose, Bld: 159 mg/dL — ABNORMAL HIGH (ref 70–99)
Potassium: 3.9 mmol/L (ref 3.5–5.1)
Sodium: 137 mmol/L (ref 135–145)
Total Bilirubin: 0.5 mg/dL (ref 0.0–1.2)
Total Protein: 7.3 g/dL (ref 6.5–8.1)

## 2024-09-13 LAB — CBC WITH DIFFERENTIAL/PLATELET
Abs Immature Granulocytes: 0.39 K/uL — ABNORMAL HIGH (ref 0.00–0.07)
Basophils Absolute: 0.1 K/uL (ref 0.0–0.1)
Basophils Relative: 1 %
Eosinophils Absolute: 0 K/uL (ref 0.0–0.5)
Eosinophils Relative: 0 %
HCT: 36.5 % — ABNORMAL LOW (ref 39.0–52.0)
Hemoglobin: 11.2 g/dL — ABNORMAL LOW (ref 13.0–17.0)
Immature Granulocytes: 3 %
Lymphocytes Relative: 2 %
Lymphs Abs: 0.3 K/uL — ABNORMAL LOW (ref 0.7–4.0)
MCH: 27.8 pg (ref 26.0–34.0)
MCHC: 30.7 g/dL (ref 30.0–36.0)
MCV: 90.6 fL (ref 80.0–100.0)
Monocytes Absolute: 2.3 K/uL — ABNORMAL HIGH (ref 0.1–1.0)
Monocytes Relative: 20 %
Neutro Abs: 8.4 K/uL — ABNORMAL HIGH (ref 1.7–7.7)
Neutrophils Relative %: 74 %
Platelets: 186 K/uL (ref 150–400)
RBC: 4.03 MIL/uL — ABNORMAL LOW (ref 4.22–5.81)
RDW: 17.2 % — ABNORMAL HIGH (ref 11.5–15.5)
WBC: 11.4 K/uL — ABNORMAL HIGH (ref 4.0–10.5)
nRBC: 0.9 % — ABNORMAL HIGH (ref 0.0–0.2)

## 2024-09-13 LAB — RESP PANEL BY RT-PCR (RSV, FLU A&B, COVID)  RVPGX2
Influenza A by PCR: NEGATIVE
Influenza B by PCR: NEGATIVE
Resp Syncytial Virus by PCR: NEGATIVE
SARS Coronavirus 2 by RT PCR: NEGATIVE

## 2024-09-13 LAB — LACTIC ACID, PLASMA: Lactic Acid, Venous: 1 mmol/L (ref 0.5–1.9)

## 2024-09-13 LAB — URINALYSIS, W/ REFLEX TO CULTURE (INFECTION SUSPECTED)
Bilirubin Urine: NEGATIVE
Glucose, UA: 50 mg/dL — AB
Hgb urine dipstick: NEGATIVE
Ketones, ur: NEGATIVE mg/dL
Leukocytes,Ua: NEGATIVE
Nitrite: NEGATIVE
Protein, ur: 100 mg/dL — AB
Specific Gravity, Urine: 1.019 (ref 1.005–1.030)
Squamous Epithelial / HPF: 0 /HPF (ref 0–5)
pH: 7 (ref 5.0–8.0)

## 2024-09-13 LAB — CBG MONITORING, ED: Glucose-Capillary: 152 mg/dL — ABNORMAL HIGH (ref 70–99)

## 2024-09-13 LAB — BLOOD GAS, VENOUS
Acid-Base Excess: 14.2 mmol/L — ABNORMAL HIGH (ref 0.0–2.0)
Bicarbonate: 40.2 mmol/L — ABNORMAL HIGH (ref 20.0–28.0)
O2 Saturation: 89.8 %
Patient temperature: 37
pCO2, Ven: 54 mmHg (ref 44–60)
pH, Ven: 7.48 — ABNORMAL HIGH (ref 7.25–7.43)
pO2, Ven: 56 mmHg — ABNORMAL HIGH (ref 32–45)

## 2024-09-13 LAB — PROTIME-INR
INR: 1 (ref 0.8–1.2)
Prothrombin Time: 13.8 s (ref 11.4–15.2)

## 2024-09-13 LAB — BODY FLUID CELL COUNT WITH DIFFERENTIAL
Eos, Fluid: 0 %
Lymphs, Fluid: 11 %
Monocyte-Macrophage-Serous Fluid: 62 %
Neutrophil Count, Fluid: 27 %
Total Nucleated Cell Count, Fluid: 396 uL

## 2024-09-13 LAB — PROCALCITONIN: Procalcitonin: 0.18 ng/mL

## 2024-09-13 LAB — PATHOLOGIST SMEAR REVIEW

## 2024-09-13 MED ORDER — METOPROLOL SUCCINATE ER 50 MG PO TB24
50.0000 mg | ORAL_TABLET | Freq: Every day | ORAL | Status: DC
  Administered 2024-09-13 – 2024-09-14 (×2): 50 mg via ORAL
  Filled 2024-09-13 (×3): qty 1

## 2024-09-13 MED ORDER — TRAZODONE HCL 50 MG PO TABS
25.0000 mg | ORAL_TABLET | Freq: Every evening | ORAL | Status: DC | PRN
  Administered 2024-09-17: 25 mg via ORAL
  Filled 2024-09-13: qty 1

## 2024-09-13 MED ORDER — LIDOCAINE HCL (PF) 1 % IJ SOLN
8.0000 mL | Freq: Once | INTRAMUSCULAR | Status: AC
  Administered 2024-09-13: 8 mL via INTRADERMAL

## 2024-09-13 MED ORDER — IPRATROPIUM-ALBUTEROL 0.5-2.5 (3) MG/3ML IN SOLN
3.0000 mL | Freq: Once | RESPIRATORY_TRACT | Status: AC
  Administered 2024-09-13: 3 mL via RESPIRATORY_TRACT
  Filled 2024-09-13: qty 3

## 2024-09-13 MED ORDER — LACTATED RINGERS IV SOLN: INTRAVENOUS | Status: AC

## 2024-09-13 MED ORDER — SODIUM CHLORIDE 0.9 % IV SOLN
2.0000 g | Freq: Once | INTRAVENOUS | Status: AC
  Administered 2024-09-13: 2 g via INTRAVENOUS
  Filled 2024-09-13: qty 10

## 2024-09-13 MED ORDER — ACETAMINOPHEN 325 MG PO TABS
650.0000 mg | ORAL_TABLET | Freq: Four times a day (QID) | ORAL | Status: DC | PRN
  Administered 2024-09-14 – 2024-09-16 (×5): 650 mg via ORAL
  Filled 2024-09-13 (×5): qty 2

## 2024-09-13 MED ORDER — LACTATED RINGERS IV BOLUS (SEPSIS)
500.0000 mL | Freq: Once | INTRAVENOUS | Status: AC
Start: 2024-09-13 — End: 2024-09-13
  Administered 2024-09-13: 500 mL via INTRAVENOUS

## 2024-09-13 MED ORDER — IPRATROPIUM-ALBUTEROL 0.5-2.5 (3) MG/3ML IN SOLN
3.0000 mL | Freq: Four times a day (QID) | RESPIRATORY_TRACT | Status: DC
  Administered 2024-09-13 – 2024-09-15 (×9): 3 mL via RESPIRATORY_TRACT
  Filled 2024-09-13 (×9): qty 3

## 2024-09-13 MED ORDER — ONDANSETRON HCL 4 MG PO TABS: 4.0000 mg | ORAL_TABLET | Freq: Four times a day (QID) | ORAL | Status: DC | PRN

## 2024-09-13 MED ORDER — LACTATED RINGERS IV BOLUS (SEPSIS)
1000.0000 mL | Freq: Once | INTRAVENOUS | Status: AC
Start: 2024-09-13 — End: 2024-09-13
  Administered 2024-09-13: 1000 mL via INTRAVENOUS

## 2024-09-13 MED ORDER — FUROSEMIDE 20 MG PO TABS
20.0000 mg | ORAL_TABLET | Freq: Every day | ORAL | Status: DC
  Administered 2024-09-14 – 2024-09-16 (×3): 20 mg via ORAL
  Filled 2024-09-13 (×5): qty 1

## 2024-09-13 MED ORDER — GABAPENTIN 300 MG PO CAPS
300.0000 mg | ORAL_CAPSULE | Freq: Three times a day (TID) | ORAL | Status: DC
  Administered 2024-09-13 – 2024-09-20 (×22): 300 mg via ORAL
  Filled 2024-09-13 (×24): qty 1

## 2024-09-13 MED ORDER — ACETAMINOPHEN 650 MG RE SUPP
650.0000 mg | Freq: Four times a day (QID) | RECTAL | Status: DC | PRN
  Filled 2024-09-13: qty 1

## 2024-09-13 MED ORDER — SODIUM CHLORIDE 0.9 % IV SOLN
2.0000 g | Freq: Three times a day (TID) | INTRAVENOUS | Status: DC
  Administered 2024-09-13 – 2024-09-17 (×13): 2 g via INTRAVENOUS
  Filled 2024-09-13 (×14): qty 12.5

## 2024-09-13 MED ORDER — VANCOMYCIN HCL 1750 MG/350ML IV SOLN
1750.0000 mg | Freq: Once | INTRAVENOUS | Status: AC
  Administered 2024-09-13: 1750 mg via INTRAVENOUS
  Filled 2024-09-13: qty 350

## 2024-09-13 MED ORDER — SENNOSIDES-DOCUSATE SODIUM 8.6-50 MG PO TABS
2.0000 | ORAL_TABLET | Freq: Every evening | ORAL | Status: DC | PRN
  Administered 2024-09-14 – 2024-09-18 (×2): 2 via ORAL
  Filled 2024-09-13 (×2): qty 2

## 2024-09-13 MED ORDER — MONTELUKAST SODIUM 10 MG PO TABS
10.0000 mg | ORAL_TABLET | Freq: Every day | ORAL | Status: DC
  Administered 2024-09-13 – 2024-09-20 (×8): 10 mg via ORAL
  Filled 2024-09-13 (×9): qty 1

## 2024-09-13 MED ORDER — ONDANSETRON HCL 4 MG/2ML IJ SOLN: 4.0000 mg | Freq: Four times a day (QID) | INTRAMUSCULAR | Status: DC | PRN

## 2024-09-13 MED ORDER — METRONIDAZOLE 500 MG/100ML IV SOLN
500.0000 mg | Freq: Two times a day (BID) | INTRAVENOUS | Status: DC
  Administered 2024-09-13 – 2024-09-17 (×9): 500 mg via INTRAVENOUS
  Filled 2024-09-13 (×11): qty 100

## 2024-09-13 MED ORDER — ALBUTEROL SULFATE (2.5 MG/3ML) 0.083% IN NEBU
2.5000 mg | INHALATION_SOLUTION | RESPIRATORY_TRACT | Status: DC | PRN
  Administered 2024-09-15 – 2024-09-18 (×2): 2.5 mg via RESPIRATORY_TRACT
  Filled 2024-09-13 (×2): qty 3

## 2024-09-13 MED ORDER — VANCOMYCIN HCL IN DEXTROSE 1-5 GM/200ML-% IV SOLN: 1000.0000 mg | Freq: Once | INTRAVENOUS | Status: DC

## 2024-09-13 MED ORDER — IOHEXOL 350 MG/ML SOLN
100.0000 mL | Freq: Once | INTRAVENOUS | Status: AC | PRN
  Administered 2024-09-13: 100 mL via INTRAVENOUS

## 2024-09-13 MED ORDER — METRONIDAZOLE 500 MG/100ML IV SOLN
500.0000 mg | Freq: Once | INTRAVENOUS | Status: AC
  Administered 2024-09-13: 500 mg via INTRAVENOUS
  Filled 2024-09-13: qty 100

## 2024-09-13 MED ORDER — PANTOPRAZOLE SODIUM 40 MG PO TBEC
40.0000 mg | DELAYED_RELEASE_TABLET | Freq: Every day | ORAL | Status: DC
  Administered 2024-09-13 – 2024-09-20 (×8): 40 mg via ORAL
  Filled 2024-09-13 (×9): qty 1

## 2024-09-13 MED ORDER — MAGNESIUM OXIDE 400 MG PO TABS
400.0000 mg | ORAL_TABLET | Freq: Every day | ORAL | Status: DC
  Administered 2024-09-13 – 2024-09-20 (×8): 400 mg via ORAL
  Filled 2024-09-13 (×17): qty 1

## 2024-09-13 MED ORDER — ACETAMINOPHEN 10 MG/ML IV SOLN
1000.0000 mg | Freq: Once | INTRAVENOUS | Status: AC
  Administered 2024-09-13: 1000 mg via INTRAVENOUS
  Filled 2024-09-13: qty 100

## 2024-09-13 MED ORDER — CLOPIDOGREL BISULFATE 75 MG PO TABS
75.0000 mg | ORAL_TABLET | Freq: Every day | ORAL | Status: DC
  Administered 2024-09-13 – 2024-09-17 (×5): 75 mg via ORAL
  Filled 2024-09-13 (×6): qty 1

## 2024-09-13 MED ORDER — ASPIRIN 81 MG PO TBEC
81.0000 mg | DELAYED_RELEASE_TABLET | Freq: Every day | ORAL | Status: DC
  Administered 2024-09-13 – 2024-09-17 (×5): 81 mg via ORAL
  Filled 2024-09-13 (×6): qty 1

## 2024-09-13 MED ORDER — ENOXAPARIN SODIUM 40 MG/0.4ML IJ SOSY
40.0000 mg | PREFILLED_SYRINGE | INTRAMUSCULAR | Status: DC
  Administered 2024-09-13 – 2024-09-19 (×7): 40 mg via SUBCUTANEOUS
  Filled 2024-09-13 (×7): qty 0.4

## 2024-09-13 MED ORDER — AMLODIPINE BESYLATE 5 MG PO TABS
5.0000 mg | ORAL_TABLET | Freq: Every day | ORAL | Status: DC
  Administered 2024-09-13 – 2024-09-19 (×4): 5 mg via ORAL
  Filled 2024-09-13 (×6): qty 1

## 2024-09-13 MED ORDER — ATORVASTATIN CALCIUM 20 MG PO TABS
40.0000 mg | ORAL_TABLET | Freq: Every day | ORAL | Status: DC
  Administered 2024-09-13 – 2024-09-16 (×4): 40 mg via ORAL
  Filled 2024-09-13 (×4): qty 2

## 2024-09-13 MED ORDER — VANCOMYCIN HCL 1750 MG/350ML IV SOLN
1750.0000 mg | INTRAVENOUS | Status: DC
  Administered 2024-09-14 – 2024-09-15 (×2): 1750 mg via INTRAVENOUS
  Filled 2024-09-13 (×2): qty 350

## 2024-09-13 NOTE — Progress Notes (Signed)
 Elink monitoring for the code sepsis protocol.

## 2024-09-13 NOTE — ED Provider Notes (Signed)
 [facility] Provider Note    Event Date/Time   First MD Initiated Contact with Patient 09/13/24 0302     (approximate)   History   Shortness of Breath   HPI  Austin Patterson is a 68 y.o. male with history of COPD, metastatic lung cancer on chemotherapy, pulmonary fibrosis chronically on 2 to 3 L of oxygen , hypertension, hyperlipidemia who presents to the emergency department with EMS for altered mental status, increased work of breathing, increasing oxygen  requirement, cough.  Patient was just discharged from the hospital yesterday evening to [facility].  [facility] state while he was there his oxygen  requirement continued to increase and they gave him breathing treatments multiple times without relief.  He is able to tell me his name at this time but not able to answer any other questions appropriately.  He has garbled speech, word salad.  Unknown last known well.  EMS did not obtain a blood glucose.   History provided by EMS, patient's facility.    Past Medical History:  Diagnosis Date   Abdominal pain 12/07/2022   Acute urinary retention 12/07/2022   Cancer (HCC)    Chest pain 01/24/2022   COPD (chronic obstructive pulmonary disease) (HCC)    GERD (gastroesophageal reflux disease)    Hemothorax on right 05/11/2023   Hyperlipidemia    Hypertension    Pleuritic pain 05/10/2023   Pneumothorax after biopsy 05/11/2023   Post procedure discomfort 05/13/2023   Pulmonary fibrosis (HCC) 11/2015    Past Surgical History:  Procedure Laterality Date   COLONOSCOPY     CORONARY STENT PLACEMENT     ESOPHAGOGASTRODUODENOSCOPY (EGD) WITH PROPOFOL  N/A 09/23/2016   Procedure: ESOPHAGOGASTRODUODENOSCOPY (EGD) WITH PROPOFOL ;  Surgeon: Ruel Kung, MD;  Location: [facility] ENDOSCOPY;  Service: Endoscopy;  Laterality: N/A;   IR IMAGING GUIDED PORT INSERTION  05/26/2023   IR IMAGING GUIDED PORT INSERTION  07/30/2023   IR RADIOLOGIST EVAL & MGMT  07/20/2023    KYPHOPLASTY N/A 03/14/2020   Procedure: T7 & T11 KYPHOPLASTY;  Surgeon: Kathlynn Sharper, MD;  Location: [facility] ORS;  Service: Orthopedics;  Laterality: N/A;   PORT-A-CATH REMOVAL N/A 06/14/2023   Procedure: REMOVAL PORT-A-CATH;  Surgeon: Tye Millet, DO;  Location: [facility] ORS;  Service: General;  Laterality: N/A;   SHOULDER ACROMIOPLASTY      MEDICATIONS:  Prior to Admission medications   Medication Sig Start Date End Date Taking? Authorizing Provider  acetaminophen  (TYLENOL ) 500 MG tablet Take 1,000 mg by mouth every 6 (six) hours as needed for moderate pain or headache.    [provider]  albuterol  (VENTOLIN  HFA) 108 (90 Base) MCG/ACT inhaler Inhale 2 puffs into the lungs every 6 (six) hours as needed for wheezing or shortness of breath. 01/29/20   Danford, Lonni SQUIBB, MD  amLODipine  (NORVASC ) 5 MG tablet Take 1 tablet (5 mg total) by mouth at bedtime. Skip the dose if systolic BP less than 130 mmHg 06/22/23 09/04/24  Von Bellis, MD  aspirin  EC 81 MG tablet Take 1 tablet (81 mg total) by mouth daily. 08/19/16   Chaplin, Don C, MD  atorvastatin  (LIPITOR) 40 MG tablet Take 1 tablet (40 mg total) by mouth at bedtime. Patient taking differently: Take 40 mg by mouth daily. 08/19/16   Berl Todd BROCKS, MD  CALCIUM  600/VITAMIN D  600-10 MG-MCG TABS Take 1 tablet by mouth 2 (two) times daily. 11/20/21   [provider]  clopidogrel  (PLAVIX ) 75 MG tablet Take 1 tablet (75 mg total) by mouth  daily. 08/19/16   Chaplin, Don C, MD  Dupilumab  (DUPIXENT ) 300 MG/2ML SOAJ Inject 300 mg into the skin every 14 (fourteen) days. 08/25/24   Tamea Dedra CROME, MD  furosemide  (LASIX ) 20 MG tablet Take 1 tablet (20 mg total) by mouth daily. Patient not taking: No sig reported 07/03/24   Babara Call, MD  gabapentin  (NEURONTIN ) 300 MG capsule TAKE 1 CAPSULE BY MOUTH THREE TIMES A DAY 07/19/24   Babara Call, MD  guaiFENesin -dextromethorphan  (ROBITUSSIN DM) 100-10 MG/5ML syrup Take 5 mLs by mouth every 4 (four) hours as  needed for cough (chest congestion). 09/12/24   Maree Hue, MD  ipratropium-albuterol  (DUONEB) 0.5-2.5 (3) MG/3ML SOLN INHALE 1 VIAL THROUGH NEBULIZER EVERY 6 HOURS 05/01/24   Tamea Dedra CROME, MD  lidocaine -prilocaine  (EMLA ) cream Apply to affected area once 04/03/24   Babara Call, MD  magnesium  oxide (MAG-OX) 400 (241.3 Mg) MG tablet Take 1 tablet (400 mg total) by mouth daily. 06/18/17   Sudini, Srikar, MD  Menthol -Methyl Salicylate  (MUSCLE RUB) 10-15 % CREA Apply 1 Application topically every 6 (six) hours as needed for muscle pain. 09/12/24   Maree Hue, MD  metoprolol  succinate (TOPROL -XL) 50 MG 24 hr tablet Take 50 mg by mouth daily. 01/24/24   [provider]  montelukast  (SINGULAIR ) 10 MG tablet Take 10 mg by mouth daily. 11/19/21   [provider]  Multiple Vitamin (MULTIVITAMIN WITH MINERALS) TABS tablet Take 1 tablet by mouth daily. 09/24/16   Patel, Sona, MD  pantoprazole  (PROTONIX ) 40 MG tablet Take 1 tablet (40 mg total) by mouth 2 (two) times daily. Patient taking differently: Take 40 mg by mouth daily. 09/23/16   Patel, Sona, MD  predniSONE  (DELTASONE ) 10 MG tablet TAKE 1 TABLET (10 MG TOTAL) BY MOUTH DAILY AS NEEDED. TAKE AS DIRECTED. 09/11/24   Tamea Dedra CROME, MD  senna-docusate (SENOKOT-S) 8.6-50 MG tablet Take 2 tablets by mouth at bedtime as needed for mild constipation. 09/12/24   Maree Hue, MD    Physical Exam   Triage Vital Signs: ED Triage Vitals  Encounter Vitals Group     BP      Girls Systolic BP Percentile      Girls Diastolic BP Percentile      Boys Systolic BP Percentile      Boys Diastolic BP Percentile      Pulse      Resp      Temp      Temp src      SpO2      Weight      Height      Head Circumference      [facility]      Pain Score      Pain Loc      Pain Education      Exclude from Growth Chart     Most recent vital signs: Vitals:   09/13/24 0600 09/13/24 0611  BP: 125/68   Pulse: (!) 106   Resp: (!) 22   Temp:  (!)  100.9 F (38.3 C)  SpO2: 95%     CONSTITUTIONAL: Alert, responds appropriately to questions. Well-appearing; well-nourished HEAD: Normocephalic, atraumatic EYES: Conjunctivae clear, pupils appear equal, sclera nonicteric ENT: normal nose; moist mucous membranes NECK: Supple, normal ROM CARD: RRR; S1 and S2 appreciated RESP: Normal chest excursion without splinting or tachypnea; breath sounds clear and equal bilaterally; no wheezes, no rhonchi, no rales, no hypoxia or respiratory distress, speaking full sentences ABD/GI: Non-distended; soft, non-tender, no rebound, no guarding,  no peritoneal signs BACK: The back appears normal EXT: Normal ROM in all joints; no deformity noted, no edema SKIN: Normal color for age and race; warm; no rash on exposed skin NEURO: Moves all extremities equally, normal speech PSYCH: The patient's mood and manner are appropriate.   ED Results / Procedures / Treatments   LABS: (all labs ordered are listed, but only abnormal results are displayed) Labs Reviewed  COMPREHENSIVE METABOLIC PANEL WITH GFR - Abnormal; Notable for the following components:      Result Value   Chloride 94 (*)    CO2 33 (*)    Glucose, Bld 159 (*)    BUN 29 (*)    AST 68 (*)    ALT 92 (*)    Alkaline Phosphatase 208 (*)    All other components within normal limits  CBC WITH DIFFERENTIAL/PLATELET - Abnormal; Notable for the following components:   WBC 11.4 (*)    RBC 4.03 (*)    Hemoglobin 11.2 (*)    HCT 36.5 (*)    RDW 17.2 (*)    nRBC 0.9 (*)    Neutro Abs 8.4 (*)    Lymphs Abs 0.3 (*)    Monocytes Absolute 2.3 (*)    Abs Immature Granulocytes 0.39 (*)    All other components within normal limits  URINALYSIS, W/ REFLEX TO CULTURE (INFECTION SUSPECTED) - Abnormal; Notable for the following components:   Color, Urine YELLOW (*)    APPearance CLEAR (*)    Glucose, UA 50 (*)    Protein, ur 100 (*)    Bacteria, UA RARE (*)    All other components within normal limits   BLOOD GAS, VENOUS - Abnormal; Notable for the following components:   pH, Ven 7.48 (*)    pO2, Ven 56 (*)    Bicarbonate 40.2 (*)    Acid-Base Excess 14.2 (*)    All other components within normal limits  CBG MONITORING, ED - Abnormal; Notable for the following components:   Glucose-Capillary 152 (*)    All other components within normal limits  RESP PANEL BY RT-PCR (RSV, FLU A&B, COVID)  RVPGX2  CULTURE, BLOOD (ROUTINE X 2)  CULTURE, BLOOD (ROUTINE X 2)  MRSA NEXT GEN BY PCR, NASAL  LACTIC ACID, PLASMA  PROTIME-INR  PROCALCITONIN     EKG:    Date: 09/13/2024  Rate: 135  Rhythm: Sinus tachycardia  QRS Axis: normal  Intervals: Bifascicular block  ST/T Wave abnormalities: normal  Conduction Disutrbances: none  Narrative Interpretation: Sinus tachycardia, bifascicular block, right related ST changes       RADIOLOGY: My personal review and interpretation of imaging: CT head shows no acute abnormality.  CTA chest redemonstrates metastatic lung cancer with mild to moderate right pleural effusion.  No PE or infiltrate.  CT abdomen pelvis unremarkable.  I have personally reviewed all radiology reports.   CT Angio Chest PE W and/or Wo Contrast Result Date: 09/13/2024 EXAM: CTA CHEST 09/13/2024 06:13:07 AM TECHNIQUE: CTA of the chest was performed after the administration of 100 mL of iohexol  (OMNIPAQUE ) 350 MG/ML injection. Multiplanar reformatted images are provided for review. MIP images are provided for review. Automated exposure control, iterative reconstruction, and/or weight based adjustment of the mA/kV was utilized to reduce the radiation dose to as low as reasonably achievable. COMPARISON: CT of the chest dated 09/06/2024. CLINICAL HISTORY: Pulmonary embolism (PE) suspected, high prob. FINDINGS: PULMONARY ARTERIES: Pulmonary arteries are adequately opacified for evaluation. There is no evidence of pulmonary embolic disease.  Main pulmonary artery is normal in caliber.  MEDIASTINUM: The heart is enlarged and there is mild-to-moderate calcific coronary artery disease. There is moderate calcific atheromatous disease within the thoracic aorta. A left internal jugular chest port is present. LYMPH NODES: There is mild mediastinal and bilateral hilar lymphadenopathy, as before. No axillary lymphadenopathy. LUNGS AND PLEURA: There are numerous metastatic nodules again demonstrated throughout the lungs, which have not changed appreciably in the interim. Large bullous again demonstrated anteriorly within the right lower lobe. There is a mild-to-moderate right-sided pleural effusion, similar to the prior exam. No pneumothorax. No focal consolidation or pulmonary edema. UPPER ABDOMEN: Limited images of the upper abdomen are unremarkable. SOFT TISSUES AND BONES: The patient is status post vertebral augmentation T10 and T6. There are posttraumatic deformities of the scapular spines bilaterally. No acute soft tissue abnormality. IMPRESSION: 1. No evidence of pulmonary embolic disease. 2. Numerous metastatic nodules throughout the lungs, stable compared to prior study. 3. Large bullous changes in the right lower lobe, stable compared to prior study. 4. Mild-to-moderate right-sided pleural effusion, similar to the prior exam. 5. Enlarged heart with mild-to-moderate calcific coronary artery disease. 6. Mild mediastinal and bilateral hilar lymphadenopathy, as before. Electronically signed by: Evalene Coho MD 09/13/2024 06:43 AM EST RP Workstation: [id number]   CT ABDOMEN PELVIS W CONTRAST Result Date: 09/13/2024 EXAM: CT ABDOMEN AND PELVIS WITH CONTRAST 09/13/2024 06:13:07 AM TECHNIQUE: CT of the abdomen and pelvis was performed with the administration of 100 mL of iohexol  (OMNIPAQUE ) 350 MG/ML injection. Multiplanar reformatted images are provided for review. Automated exposure control, iterative reconstruction, and/or weight-based adjustment of the mA/kV was utilized to reduce the  radiation dose to as low as reasonably achievable. COMPARISON: CT of the abdomen and pelvis dated 06/12/2024. CLINICAL HISTORY: Sepsis. FINDINGS: LOWER CHEST: Since the previous study, a pleural based mass present posteriorly along the left lung base has increased in size from approximately 5.2 x 3.9 cm to approximately 6.5 x 3.9 cm. An ovoid lesion previously noted anteriorly on the right has also increased in size in the interim from 3.7 x 3.2 cm to approximately 4.4 x 3.5 cm. The patient has also developed numerous metastatic nodules within the lung bases, primarily on the left. There are at least 15 new nodules with the largest measuring 15 mm in diameter, which is seen within the lingula on image 13 of series 4, measuring 15 mm in diameter. An irregular nodular lesion previously noted laterally within the right lower lobe has also enlarged. There is a mild-to-moderate right-sided pleural effusion. LIVER: There has been interval development of at least 3 new metastatic lesions within segment 7 of the liver, with the largest seen on image 14 of series 2, measuring approximately 19 x 18 mm. GALLBLADDER AND BILE DUCTS: Gallbladder is unremarkable. No biliary ductal dilatation. SPLEEN: No acute abnormality. PANCREAS: No acute abnormality. ADRENAL GLANDS: Bilateral adrenal nodules again demonstrated, which are unchanged in the interim. KIDNEYS, URETERS AND BLADDER: There is mild bilateral renal cortical atrophy. No stones in the kidneys or ureters. No hydronephrosis. No perinephric or periureteral stranding. Urinary bladder is unremarkable. GI AND BOWEL: Stomach demonstrates no acute abnormality. There is no bowel obstruction. PERITONEUM AND RETROPERITONEUM: No ascites. No free air. VASCULATURE: The abdominal aorta is normal in caliber, but densely calcified. LYMPH NODES: No lymphadenopathy. REPRODUCTIVE ORGANS: No acute abnormality. BONES AND SOFT TISSUES: There is mild-to-moderate levoscoliosis of the thoracolumbar  spine. The patient is status post vertebral augmentation at T10. There is no convincing evidence of  osseous metastatic disease. No focal soft tissue abnormality. IMPRESSION: 1. Interval increase in size of pleural-based left basilar mass and right anterior ovoid pleural lesion with development of numerous new pulmonary metastatic nodules (at least 15, largest 15 mm in the lingula) and enlargement of a previously noted irregular right lower lobe nodule, consistent with progressive thoracic metastatic disease; mild-to-moderate right pleural effusion. 2. Interval development of at least three new hepatic metastatic lesions in segment 7, largest 19 x 18 mm, consistent with progressive hepatic metastatic disease. 3. No convincing evidence of osseous metastatic disease. Electronically signed by: Evalene Coho MD 09/13/2024 06:36 AM EST RP Workstation: [id number]   CT HEAD WO CONTRAST ( ) Result Date: 09/13/2024 EXAM: CT HEAD WITHOUT 09/13/2024 06:13:07 AM TECHNIQUE: CT of the head was performed without the administration of intravenous contrast. Automated exposure control, iterative reconstruction, and/or weight based adjustment of the mA/kV was utilized to reduce the radiation dose to as low as reasonably achievable. COMPARISON: CT of the head dated 05/12/2023. CLINICAL HISTORY: Mental status change, unknown cause. FINDINGS: BRAIN AND VENTRICLES: No acute intracranial hemorrhage. No mass effect or midline shift. No extra-axial fluid collection. No evidence of acute infarct. No hydrocephalus. Patchy white matter hypodensities, compatible with chronic microvascular ischemic disease. Calcific atherosclerosis. ORBITS: No acute abnormality. SINUSES AND MASTOIDS: Mild polypoid mucosal disease within the floor of the left maxillary sinus. SOFT TISSUES AND SKULL: No acute skull fracture. No acute soft tissue abnormality. IMPRESSION: 1. No acute intracranial abnormality. 2. Patchy white matter hypodensities, compatible  with chronic microvascular ischemic disease. 3. Calcific atherosclerosis. 4. Mild polypoid mucosal disease within the floor of the left maxillary sinus. Electronically signed by: Evalene Coho MD 09/13/2024 06:16 AM EST RP Workstation: [id number]   DG Chest Port 1 View Result Date: 09/13/2024 EXAM: 1 VIEW(S) XRAY OF THE CHEST 09/13/2024 03:27:00 AM COMPARISON: 09/06/2024 CLINICAL HISTORY: Possible sepsis and shortness of breath FINDINGS: LINES, TUBES AND DEVICES: Left chest port stable in place. LUNGS AND PLEURA: Decreased right pleural effusion, now small. Diffuse interstitial prominence. Unchanged patchy nodular densities at the right middle lobe and medial left lung base. Stable nodular densities at the left lung base. No pneumothorax. HEART AND MEDIASTINUM: No acute abnormality of the cardiac and mediastinal silhouettes. BONES AND SOFT TISSUES: Skin fold is noted over the right chest. No acute osseous abnormality. IMPRESSION: 1. Decreased right pleural effusion, now small. 2. Diffuse interstitial prominence. 3. Unchanged patchy nodular densities at the right middle lobe and medial left lung base. Electronically signed by: Oneil Devonshire MD 09/13/2024 03:30 AM EST RP Workstation: [id number]     PROCEDURES:  Critical Care performed: Yes, see critical care procedure note(s)   CRITICAL CARE Performed by: Josette Sink   Total critical care time: 30 minutes  Critical care time was exclusive of separately billable procedures and treating other patients.  Critical care was necessary to treat or prevent imminent or life-threatening deterioration.  Critical care was time spent personally by me on the following activities: development of treatment plan with patient and/or surrogate as well as nursing, discussions with consultants, evaluation of patient's response to treatment, examination of patient, obtaining history from patient or surrogate, ordering and performing treatments and interventions,  ordering and review of laboratory studies, ordering and review of radiographic studies, pulse oximetry and re-evaluation of patient's condition.   SABRA1-3 Lead EKG Interpretation  Performed by: Paulino Cork, Josette SAILOR, DO Authorized by: Lliam Hoh, Josette SAILOR, DO     Interpretation: abnormal     ECG rate:  135  ECG rate assessment: tachycardic     Rhythm: sinus tachycardia     Ectopy: none     Conduction: normal       IMPRESSION / MDM / ASSESSMENT AND PLAN / ED COURSE  I reviewed the triage vital signs and the nursing notes.    Patient here with altered mental status, cough and shortness of breath with increasing oxygen  requirement.  Found to be febrile, tachycardic and tachypneic.  The patient is on the cardiac monitor to evaluate for evidence of arrhythmia and/or significant heart rate changes.   DIFFERENTIAL DIAGNOSIS (includes but not limited to):   Sepsis, pneumonia, viral URI, PE, meningitis, UTI, bacteremia, anemia, electrolyte derangement, ACS, CHF, COPD, worsening lung cancer, pneumothorax   Patient's presentation is most consistent with acute presentation with potential threat to life or bodily function.   PLAN: Will obtain labs, cultures, urine, chest x-ray, COVID and flu swab.  Will give IV fluids, broad-spectrum antibiotics.  He is altered here but awake and alert.  Will place him on BiPAP due to increased work of breathing.   MEDICATIONS GIVEN IN ED: Medications  lactated ringers  infusion ( Intravenous New Bag/Given 09/13/24 0338)  metroNIDAZOLE  (FLAGYL ) IVPB 500 mg (has no administration in time range)  lactated ringers  bolus 1,000 mL (0 mLs Intravenous Stopped 09/13/24 0538)    And  lactated ringers  bolus 1,000 mL (0 mLs Intravenous Stopped 09/13/24 0538)    And  lactated ringers  bolus 500 mL (500 mLs Intravenous New Bag/Given 09/13/24 0542)  aztreonam  (AZACTAM ) 2 g in sodium chloride  0.9 % 100 mL IVPB (0 g Intravenous Stopped 09/13/24 0405)  metroNIDAZOLE  (FLAGYL ) IVPB  500 mg (0 mg Intravenous Stopped 09/13/24 0458)  vancomycin  (VANCOREADY) IVPB 1750 mg/350 mL (1,750 mg Intravenous New Bag/Given 09/13/24 0405)  acetaminophen  (OFIRMEV ) IV 1,000 mg (0 mg Intravenous Stopped 09/13/24 0458)  iohexol  (OMNIPAQUE ) 350 MG/ML injection 100 mL (100 mLs Intravenous Contrast Given 09/13/24 0609)  ipratropium-albuterol  (DUONEB) 0.5-2.5 (3) MG/3ML nebulizer solution 3 mL (3 mLs Nebulization Given 09/13/24 0700)     ED COURSE: Patient's labs show leukocytosis of 11.4..  He does have elevation of his AST, ALT and alkaline phosphatase but benign abdominal exam.  VBG shows metabolic alkalosis.  Blood sugar normal.  Chest x-ray reviewed and interpreted by myself the radiologist and shows right sided pleural effusion.  Lactic acid level is normal.  COVID, flu and RSV negative.  Procalcitonin 0.18.  His mental status has improved significantly and he has been transitioned off of BiPAP per his request.  He is now alert and oriented x 4.  He denies any headache and has no neck pain or neck stiffness.  Suspect respiratory source of the cause of his fever and shortness of breath today.  His urine does not appear infected.  CT scans of his head, CTA of the chest and CT of the abdomen pelvis reviewed and interpreted by myself and the radiologist and show increased size of the left basilar mass and right anterior pleural lesion with numerous new pulmonary metastatic nodules, new hepatic metastatic lesions.  Decreased size of his right pleural effusion.  No infiltrate, edema seen.  Elevated liver function tests are likely due to metastatic disease to the liver.  CT head shows no acute abnormality.  At this time I do not feel he needs a lumbar puncture.  Will discuss with hospitalist for admission for sepsis, increased oxygen  requirement.  Sats 93% on 6 L currently.  He is tachypneic but declines going back on  BiPAP.  Does report feeling somewhat short of breath.  Will give him a breathing  treatment here.   CONSULTS:  Consulted and discussed patient's case with hospitalist, Dr. Cleatus.  I have recommended admission and consulting physician agrees and will place admission orders.  Patient (and family if present) agree with this plan.   I reviewed all nursing notes, vitals, pertinent previous records.  All labs, EKGs, imaging ordered have been independently reviewed and interpreted by myself.    OUTSIDE RECORDS REVIEWED: Reviewed recent hospital notes.       FINAL CLINICAL IMPRESSION(S) / ED DIAGNOSES   Final diagnoses:  Acute sepsis (HCC)  Altered mental status, unspecified altered mental status type  Acute on chronic respiratory failure with hypoxia (HCC)     Rx / DC Orders   ED Discharge Orders     None        Note:  This document was prepared using Dragon voice recognition software and may include unintentional dictation errors.   Jabree Rebert, Josette SAILOR, DO 09/13/24 [phone]

## 2024-09-13 NOTE — H&P (Signed)
 History and Physical  Austin Patterson FMW:[id number] DOB: 02-12-1956 DOA: 09/13/2024  PCP: Autry Grayce LABOR, PA   Chief Complaint: Fever, hypoxia  HPI: Austin Patterson is a 68 y.o. male with medical history significant for hypertension, hyperlipidemia, heart failure with preserved EF, stage IV lung cancer on palliative chemotherapy, asthma, COPD and paroxysmal A-fib just discharged on 11/25 being admitted to the hospital with recurrent acute on chronic hypoxic respiratory failure and sepsis likely due to continued pneumonia.  He was treated during this last hospitalization with empiric IV azithromycin  and Rocephin , completed a course.  He was diuresed due to his heart failure, and was noted to be euvolemic on his baseline 3 to 4 L nasal cannula oxygen  upon discharge.  Per the patient, he returned back to the hospital because he was not getting along with the staff, who he says had an attitude.  When asked, he denies any cough, shortness of breath or any significant changes.  However per ER documentation, EMS was called from his rehab facility due to patient being very short of breath without relief despite hourly breathing treatments, he was noted to be saturating 60% on 4 L nasal cannula oxygen  by EMS.  In the emergency department, patient received empiric IV antibiotics, was placed on BiPAP for work of breathing and is now significantly improved with normal saturations on 6 L nasal cannula.  Review of Systems: Please see HPI for pertinent positives and negatives. A complete 10 system review of systems are otherwise negative.  Past Medical History:  Diagnosis Date   Abdominal pain 12/07/2022   Acute urinary retention 12/07/2022   Cancer (HCC)    Chest pain 01/24/2022   COPD (chronic obstructive pulmonary disease) (HCC)    GERD (gastroesophageal reflux disease)    Hemothorax on right 05/11/2023   Hyperlipidemia    Hypertension    Pleuritic pain 05/10/2023   Pneumothorax after biopsy  05/11/2023   Post procedure discomfort 05/13/2023   Pulmonary fibrosis (HCC) 11/2015   Past Surgical History:  Procedure Laterality Date   COLONOSCOPY     CORONARY STENT PLACEMENT     ESOPHAGOGASTRODUODENOSCOPY (EGD) WITH PROPOFOL  N/A 09/23/2016   Procedure: ESOPHAGOGASTRODUODENOSCOPY (EGD) WITH PROPOFOL ;  Surgeon: Ruel Kung, MD;  Location: [facility] END[facility]COPY;  Service: Endoscopy;  Laterality: N/A;   IR IMAGING GUIDED PORT INSERTION  05/26/2023   IR IMAGING GUIDED PORT INSERTION  07/30/2023   IR RADIOLOGIST EVAL & MGMT  07/20/2023   KYPHOPLASTY N/A 03/14/2020   Procedure: T7 & T11 KYPHOPLASTY;  Surgeon: Kathlynn Sharper, MD;  Location: [facility] ORS;  Service: Orthopedics;  Laterality: N/A;   PORT-A-CATH REMOVAL N/A 06/14/2023   Procedure: REMOVAL PORT-A-CATH;  Surgeon: Tye Millet, DO;  Location: [facility] ORS;  Service: General;  Laterality: N/A;   SHOULDER ACROMIOPLASTY     Social History:  reports that he quit smoking about 8 years ago. His smoking use included cigarettes. He started smoking about 58 years ago. He has a 100 pack-year smoking history. He has quit using smokeless tobacco. He reports current alcohol use of about 56.0 standard drinks of alcohol per week. He reports that he does not use drugs.  Allergies  Allergen Reactions   Amoxicillin  Anaphylaxis   Tizanidine      Feet and ankle swell     Family History  Problem Relation Age of Onset   Heart disease Mother      Prior to Admission medications   Medication Sig Start Date End Date Taking? Authorizing Provider  acetaminophen  (TYLENOL ) 500  MG tablet Take 1,000 mg by mouth every 6 (six) hours as needed for moderate pain or headache.   Yes [provider]  albuterol  (VENTOLIN  HFA) 108 (90 Base) MCG/ACT inhaler Inhale 2 puffs into the lungs every 6 (six) hours as needed for wheezing or shortness of breath. 01/29/20  Yes Danford, Lonni SQUIBB, MD  amLODipine  (NORVASC ) 5 MG tablet Take 1 tablet (5 mg total) by mouth at bedtime. Skip  the dose if systolic BP less than 130 mmHg 06/22/23 09/13/24 Yes Von Bellis, MD  aspirin  EC 81 MG tablet Take 1 tablet (81 mg total) by mouth daily. 08/19/16  Yes Chaplin, Don C, MD  atorvastatin  (LIPITOR) 40 MG tablet Take 1 tablet (40 mg total) by mouth at bedtime. Patient taking differently: Take 40 mg by mouth daily. 08/19/16  Yes Berl Todd BROCKS, MD  CALCIUM  600/VITAMIN D  600-10 MG-MCG TABS Take 1 tablet by mouth 2 (two) times daily. 11/20/21  Yes [provider]  clopidogrel  (PLAVIX ) 75 MG tablet Take 1 tablet (75 mg total) by mouth daily. 08/19/16  Yes Chaplin, Don C, MD  Dupilumab  (DUPIXENT ) 300 MG/2ML SOAJ Inject 300 mg into the skin every 14 (fourteen) days. 08/25/24  Yes Tamea Dedra CROME, MD  furosemide  (LASIX ) 20 MG tablet Take 1 tablet (20 mg total) by mouth daily. 07/03/24  Yes Babara Call, MD  gabapentin  (NEURONTIN ) 300 MG capsule TAKE 1 CAPSULE BY MOUTH THREE TIMES A DAY 07/19/24  Yes Babara Call, MD  guaiFENesin -dextromethorphan  (ROBITUSSIN DM) 100-10 MG/5ML syrup Take 5 mLs by mouth every 4 (four) hours as needed for cough (chest congestion). 09/12/24  Yes Maree Hue, MD  ipratropium-albuterol  (DUONEB) 0.5-2.5 (3) MG/3ML SOLN INHALE 1 VIAL THROUGH NEBULIZER EVERY 6 HOURS 05/01/24  Yes Tamea Dedra CROME, MD  magnesium  oxide (MAG-OX) 400 (241.3 Mg) MG tablet Take 1 tablet (400 mg total) by mouth daily. 06/18/17  Yes Sudini, Philis, MD  Menthol -Methyl Salicylate  (MUSCLE RUB) 10-15 % CREA Apply 1 Application topically every 6 (six) hours as needed for muscle pain. 09/12/24  Yes Maree Hue, MD  metoprolol  succinate (TOPROL -XL) 50 MG 24 hr tablet Take 50 mg by mouth daily. 01/24/24  Yes [provider]  montelukast  (SINGULAIR ) 10 MG tablet Take 10 mg by mouth daily. 11/19/21  Yes [provider]  Multiple Vitamin (MULTIVITAMIN WITH MINERALS) TABS tablet Take 1 tablet by mouth daily. 09/24/16  Yes Patel, Sona, MD  pantoprazole  (PROTONIX ) 40 MG tablet Take 1 tablet (40 mg total)  by mouth 2 (two) times daily. Patient taking differently: Take 40 mg by mouth daily. 09/23/16  Yes Patel, Sona, MD  trolamine salicylate (ASPERCREME) 10 % cream Apply 1 Application topically as needed for muscle pain.   Yes [provider]  lidocaine -prilocaine  (EMLA ) cream Apply to affected area once Patient not taking: Reported on 09/13/2024 04/03/24   Babara Call, MD  predniSONE  (DELTASONE ) 10 MG tablet TAKE 1 TABLET (10 MG TOTAL) BY MOUTH DAILY AS NEEDED. TAKE AS DIRECTED. Patient not taking: Reported on 09/13/2024 09/11/24   Tamea Dedra CROME, MD  senna-docusate (SENOKOT-S) 8.6-50 MG tablet Take 2 tablets by mouth at bedtime as needed for mild constipation. Patient not taking: Reported on 09/13/2024 09/12/24   Maree Hue, MD    Physical Exam: BP 125/68   Pulse (!) 106   Temp (!) 100.9 F (38.3 C) (Oral)   Resp (!) 22   Ht 5' 7 (1.702 m)   Wt 80 kg   SpO2 95%   BMI 27.62  kg/m  General:  Alert, oriented, calm, in no acute distress, wearing 6 L nasal cannula oxygen , no cough, speaking in full sentences. Cardiovascular: RRR, no murmurs or rubs, no peripheral edema  Respiratory: Breath sounds are globally diminished especially the bilateral bases, currently no tachypnea, active wheezing or rhonchi Abdomen: soft, nontender, nondistended, normal bowel tones heard  Skin: dry, no rashes  Musculoskeletal: no joint effusions, normal range of motion  Psychiatric: appropriate affect, normal speech  Neurologic: extraocular muscles intact, clear speech, moving all extremities with intact sensorium         Labs on Admission:  Basic Metabolic Panel: Recent Labs  Lab 09/06/24 1405 09/07/24 0332 09/09/24 0400 09/10/24 0604 09/11/24 0258 09/12/24 0421 09/13/24 0320  NA 142   < > 142 140 141 138 137  K 4.7   < > 4.0 3.8 4.6 3.8 3.9  CL 104   < > 100 100 100 95* 94*  CO2 25   < > 36* 33* 34* 35* 33*  GLUCOSE 122*   < > 94 90 129* 101* 159*  BUN 25*   < > 23 22 20  25* 29*   CREATININE 1.29*   < > 1.10 0.89 0.91 0.93 0.96  CALCIUM  9.2   < > 8.2* 8.0* 8.3* 8.5* 9.0  MG 1.7  --   --   --   --   --   --    < > = values in this interval not displayed.   Liver Function Tests: Recent Labs  Lab 09/06/24 1405 09/13/24 0320  AST 32 68*  ALT 15 92*  ALKPHOS 134* 208*  BILITOT 0.4 0.5  PROT 6.6 7.3  ALBUMIN 3.7 3.8   No results for input(s): LIPASE, AMYLASE in the last 168 hours. No results for input(s): AMMONIA in the last 168 hours. CBC: Recent Labs  Lab 09/06/24 1405 09/07/24 0332 09/09/24 0400 09/10/24 0604 09/11/24 0258 09/12/24 0421 09/13/24 0320  WBC 9.1   < > 5.6 2.5* 3.5* 6.7 11.4*  NEUTROABS 8.7*  --   --   --   --   --  8.4*  HGB 10.4*   < > 8.5* 8.6* 9.2* 9.8* 11.2*  HCT 34.3*   < > 28.2* 29.3* 30.5* 32.4* 36.5*  MCV 95.8   < > 94.6 95.4 93.0 93.4 90.6  PLT 296   < > 262 237 187 185 186   < > = values in this interval not displayed.   Cardiac Enzymes: No results for input(s): CKTOTAL, CKMB, CKMBINDEX, TROPONINI in the last 168 hours. BNP (last 3 results) Recent Labs    07/13/24 1335  BNP 70.6    ProBNP (last 3 results) No results for input(s): PROBNP in the last 8760 hours.  CBG: Recent Labs  Lab 09/08/24 1133 09/09/24 0724 09/10/24 0840 09/12/24 0821 09/13/24 0311  GLUCAP 168* 81 86 95 152*    Radiological Exams on Admission: CT Angio Chest PE W and/or Wo Contrast Result Date: 09/13/2024 EXAM: CTA CHEST 09/13/2024 06:13:07 AM TECHNIQUE: CTA of the chest was performed after the administration of 100 mL of iohexol  (OMNIPAQUE ) 350 MG/ML injection. Multiplanar reformatted images are provided for review. MIP images are provided for review. Automated exposure control, iterative reconstruction, and/or weight based adjustment of the mA/kV was utilized to reduce the radiation dose to as low as reasonably achievable. COMPARISON: CT of the chest dated 09/06/2024. CLINICAL HISTORY: Pulmonary embolism (PE) suspected,  high prob. FINDINGS: PULMONARY ARTERIES: Pulmonary arteries are adequately opacified for evaluation.  There is no evidence of pulmonary embolic disease. Main pulmonary artery is normal in caliber. MEDIASTINUM: The heart is enlarged and there is mild-to-moderate calcific coronary artery disease. There is moderate calcific atheromatous disease within the thoracic aorta. A left internal jugular chest port is present. LYMPH NODES: There is mild mediastinal and bilateral hilar lymphadenopathy, as before. No axillary lymphadenopathy. LUNGS AND PLEURA: There are numerous metastatic nodules again demonstrated throughout the lungs, which have not changed appreciably in the interim. Large bullous again demonstrated anteriorly within the right lower lobe. There is a mild-to-moderate right-sided pleural effusion, similar to the prior exam. No pneumothorax. No focal consolidation or pulmonary edema. UPPER ABDOMEN: Limited images of the upper abdomen are unremarkable. SOFT TISSUES AND BONES: The patient is status post vertebral augmentation T10 and T6. There are posttraumatic deformities of the scapular spines bilaterally. No acute soft tissue abnormality. IMPRESSION: 1. No evidence of pulmonary embolic disease. 2. Numerous metastatic nodules throughout the lungs, stable compared to prior study. 3. Large bullous changes in the right lower lobe, stable compared to prior study. 4. Mild-to-moderate right-sided pleural effusion, similar to the prior exam. 5. Enlarged heart with mild-to-moderate calcific coronary artery disease. 6. Mild mediastinal and bilateral hilar lymphadenopathy, as before. Electronically signed by: Evalene Coho MD 09/13/2024 06:43 AM EST RP Workstation: [id number]   CT ABDOMEN PELVIS W CONTRAST Result Date: 09/13/2024 EXAM: CT ABDOMEN AND PELVIS WITH CONTRAST 09/13/2024 06:13:07 AM TECHNIQUE: CT of the abdomen and pelvis was performed with the administration of 100 mL of iohexol  (OMNIPAQUE ) 350 MG/ML  injection. Multiplanar reformatted images are provided for review. Automated exposure control, iterative reconstruction, and/or weight-based adjustment of the mA/kV was utilized to reduce the radiation dose to as low as reasonably achievable. COMPARISON: CT of the abdomen and pelvis dated 06/12/2024. CLINICAL HISTORY: Sepsis. FINDINGS: LOWER CHEST: Since the previous study, a pleural based mass present posteriorly along the left lung base has increased in size from approximately 5.2 x 3.9 cm to approximately 6.5 x 3.9 cm. An ovoid lesion previously noted anteriorly on the right has also increased in size in the interim from 3.7 x 3.2 cm to approximately 4.4 x 3.5 cm. The patient has also developed numerous metastatic nodules within the lung bases, primarily on the left. There are at least 15 new nodules with the largest measuring 15 mm in diameter, which is seen within the lingula on image 13 of series 4, measuring 15 mm in diameter. An irregular nodular lesion previously noted laterally within the right lower lobe has also enlarged. There is a mild-to-moderate right-sided pleural effusion. LIVER: There has been interval development of at least 3 new metastatic lesions within segment 7 of the liver, with the largest seen on image 14 of series 2, measuring approximately 19 x 18 mm. GALLBLADDER AND BILE DUCTS: Gallbladder is unremarkable. No biliary ductal dilatation. SPLEEN: No acute abnormality. PANCREAS: No acute abnormality. ADRENAL GLANDS: Bilateral adrenal nodules again demonstrated, which are unchanged in the interim. KIDNEYS, URETERS AND BLADDER: There is mild bilateral renal cortical atrophy. No stones in the kidneys or ureters. No hydronephrosis. No perinephric or periureteral stranding. Urinary bladder is unremarkable. GI AND BOWEL: Stomach demonstrates no acute abnormality. There is no bowel obstruction. PERITONEUM AND RETROPERITONEUM: No ascites. No free air. VASCULATURE: The abdominal aorta is normal in  caliber, but densely calcified. LYMPH NODES: No lymphadenopathy. REPRODUCTIVE ORGANS: No acute abnormality. BONES AND SOFT TISSUES: There is mild-to-moderate levoscoliosis of the thoracolumbar spine. The patient is status post vertebral augmentation at  T10. There is no convincing evidence of osseous metastatic disease. No focal soft tissue abnormality. IMPRESSION: 1. Interval increase in size of pleural-based left basilar mass and right anterior ovoid pleural lesion with development of numerous new pulmonary metastatic nodules (at least 15, largest 15 mm in the lingula) and enlargement of a previously noted irregular right lower lobe nodule, consistent with progressive thoracic metastatic disease; mild-to-moderate right pleural effusion. 2. Interval development of at least three new hepatic metastatic lesions in segment 7, largest 19 x 18 mm, consistent with progressive hepatic metastatic disease. 3. No convincing evidence of osseous metastatic disease. Electronically signed by: Evalene Coho MD 09/13/2024 06:36 AM EST RP Workstation: [id number]   CT HEAD WO CONTRAST ( ) Result Date: 09/13/2024 EXAM: CT HEAD WITHOUT 09/13/2024 06:13:07 AM TECHNIQUE: CT of the head was performed without the administration of intravenous contrast. Automated exposure control, iterative reconstruction, and/or weight based adjustment of the mA/kV was utilized to reduce the radiation dose to as low as reasonably achievable. COMPARISON: CT of the head dated 05/12/2023. CLINICAL HISTORY: Mental status change, unknown cause. FINDINGS: BRAIN AND VENTRICLES: No acute intracranial hemorrhage. No mass effect or midline shift. No extra-axial fluid collection. No evidence of acute infarct. No hydrocephalus. Patchy white matter hypodensities, compatible with chronic microvascular ischemic disease. Calcific atherosclerosis. ORBITS: No acute abnormality. SINUSES AND MASTOIDS: Mild polypoid mucosal disease within the floor of the left  maxillary sinus. SOFT TISSUES AND SKULL: No acute skull fracture. No acute soft tissue abnormality. IMPRESSION: 1. No acute intracranial abnormality. 2. Patchy white matter hypodensities, compatible with chronic microvascular ischemic disease. 3. Calcific atherosclerosis. 4. Mild polypoid mucosal disease within the floor of the left maxillary sinus. Electronically signed by: Evalene Coho MD 09/13/2024 06:16 AM EST RP Workstation: [id number]   DG Chest Port 1 View Result Date: 09/13/2024 EXAM: 1 VIEW(S) XRAY OF THE CHEST 09/13/2024 03:27:00 AM COMPARISON: 09/06/2024 CLINICAL HISTORY: Possible sepsis and shortness of breath FINDINGS: LINES, TUBES AND DEVICES: Left chest port stable in place. LUNGS AND PLEURA: Decreased right pleural effusion, now small. Diffuse interstitial prominence. Unchanged patchy nodular densities at the right middle lobe and medial left lung base. Stable nodular densities at the left lung base. No pneumothorax. HEART AND MEDIASTINUM: No acute abnormality of the cardiac and mediastinal silhouettes. BONES AND SOFT TISSUES: Skin fold is noted over the right chest. No acute osseous abnormality. IMPRESSION: 1. Decreased right pleural effusion, now small. 2. Diffuse interstitial prominence. 3. Unchanged patchy nodular densities at the right middle lobe and medial left lung base. Electronically signed by: Oneil Devonshire MD 09/13/2024 03:30 AM EST RP Workstation: [id number]   Assessment/Plan Austin Patterson is a 68 y.o. male with medical history significant for hypertension, hyperlipidemia, heart failure with preserved EF, stage IV lung cancer on palliative chemotherapy, asthma, COPD and paroxysmal A-fib just discharged on 11/25 being admitted to the hospital with recurrent acute on chronic hypoxic respiratory failure and sepsis likely due to continued pneumonia.   Sepsis due to pneumonia-meeting criteria with tachycardia, fever, leukocytosis, source is most likely his community-acquired  pneumonia, though he completed a course of empiric IV azithromycin  and IV Rocephin  during his last hospitalization. -Inpatient admission -Follow-up blood cultures -Continue broad-spectrum IV cefepime , IV Flagyl , IV vancomycin   Acute on chronic hypoxic respiratory failure-likely due to pneumonia as above, he does have a right-sided effusion that is now mild to moderate in size -Continue supplemental oxygen , wean as tolerated -Patient was discharged on 11/25 on 3 to 4 L nasal cannula  Right-sided pleural  effusion-related to his malignancy, now in the setting of fever and worsening effusion thoracentesis would be beneficial for palliation of hypoxia, as well as to obtain cultures.  He had 700 cc removed from his right sided effusion on 06/21/2024 -IR consult for ultrasound-guided thoracentesis  Chronic anemia-stable  GERD-oral PPI  Hyperlipidemia-statin  Hypertension-continue metoprolol  and Norvasc   Paroxysmal atrial fibrillation-continue metoprolol , not on anticoagulation  Stage IV adenocarcinoma of the lung-follows closely with oncology Dr. Babara, currently getting palliative gemcitabine   DVT prophylaxis: Lovenox      Code Status: Limited: Do not attempt resuscitation (DNR) -DNR-LIMITED -Do Not Intubate/DNI   Consults called: None  Admission status: The appropriate patient status for this patient is INPATIENT. Inpatient status is judged to be reasonable and necessary in order to provide the required intensity of service to ensure the patient's safety. The patient's presenting symptoms, physical exam findings, and initial radiographic and laboratory data in the context of their chronic comorbidities is felt to place them at high risk for further clinical deterioration. Furthermore, it is not anticipated that the patient will be medically stable for discharge from the hospital within 2 midnights of admission.    I certify that at the point of admission it is my clinical judgment that the  patient will require inpatient hospital care spanning beyond 2 midnights from the point of admission due to high intensity of service, high risk for further deterioration and high frequency of surveillance required  Time spent: 59 minutes  Lyrika Souders CHRISTELLA Gail MD [facility] Pager [phone]  If 7PM-7AM, please contact night-coverage [email or web address] Password [email or web address]  09/13/2024, 8:04 AM

## 2024-09-13 NOTE — Progress Notes (Signed)
 Pharmacy Antibiotic Note  Austin Patterson is a 68 y.o. male admitted on 09/13/2024 with sepsis. PMH significant for HTN, HLD, HFpEF, Stage IV Lung Cancer on palliative chemo, Asthma, COPD and paroxymal atrial fibrillation.  Pharmacy has been consulted for Vancomycin  dosing.  11/26: Patient's renal function is stable, with Scr 0.96 (BL 0.9-1.2). Patient is currently leukocytic (WBC 11.4) and afebrile. S/p thoracentesis with 600 mL fluid removal. Sent for cultures. Will continue to monitor.    Plan: - Received Vancomycin  1750 mg IV loading dose x 1  - Will start Vancomycin  maintenance dose at 1750 mg IV q24h (eAUC 493.6, Cmin 10.2, Scr 0.96, IBW used, Vd 0.72 L/kg) - Goal AUC 400-550 - Also receiving Cefepime  2 g IV q8h and Flagyl  500 mg IV q12h  - Will continue to monitor renal functions and signs of clinical improvement    Height: 5' 7 (170.2 cm) Weight: 80 kg (176 lb 5.9 oz) IBW/kg (Calculated) : 66.1  Temp (24hrs), Avg:100.1 F (37.8 C), Min:98.2 F (36.8 C), Max:102.8 F (39.3 C)  Recent Labs  Lab 09/06/24 1405 09/06/24 1625 09/07/24 0332 09/09/24 0400 09/10/24 0604 09/11/24 0258 09/12/24 0421 09/13/24 0320  WBC 9.1  --    < > 5.6 2.5* 3.5* 6.7 11.4*  CREATININE 1.29*  --    < > 1.10 0.89 0.91 0.93 0.96  LATICACIDVEN 2.1* 1.1  --   --   --   --   --  1.0   < > = values in this interval not displayed.    Estimated Creatinine Clearance: 74.7 mL/min (by C-G formula based on SCr of 0.96 mg/dL).    Allergies  Allergen Reactions   Amoxicillin  Anaphylaxis   Tizanidine      Feet and ankle swell     Antimicrobials this admission: - Cefepime  11/26 >> - Flagyl  11/26 >> - Vancomycin  11/26 >>   Dose adjustments this admission:   Microbiology results: 11/26 BCx: NGTD 11/26 MRSA PCR: ordered 11/26 Pleural Fluid: in process   Thank you for allowing pharmacy to be a part of this patient's care.   Ransom Blanch PGY-1 Pharmacy Resident  [place] -  [facility]  09/13/2024 12:27 PM

## 2024-09-13 NOTE — ED Notes (Signed)
CBG 152  

## 2024-09-13 NOTE — ED Notes (Signed)
 Per pt request, BiPAP paused and mask removed from pts face. Pt placed on 6L [place] as pt wears 4L [place] chronically. Pt alert and oriented to person, place, time, and situation at this time. Requesting not to be sent back to [facility].

## 2024-09-13 NOTE — Progress Notes (Signed)
 CODE SEPSIS - PHARMACY COMMUNICATION  **Broad Spectrum Antibiotics should be administered within 1 hour of Sepsis diagnosis**  Time Code Sepsis Called/Page Received: 0308  Antibiotics Ordered: Aztreonam , Flagyl , Vancomycin   Time of 1st antibiotic administration: 0327  Austin Patterson, PharmD, [facility] 09/13/2024 3:09 AM

## 2024-09-13 NOTE — Progress Notes (Signed)
 Requested new set of vitals as patient has a RED [place] and was already a sepsis protocol in ER. Ginny, Paramedic stated she would get them. Sarah CN notified of above.

## 2024-09-13 NOTE — ED Triage Notes (Signed)
 Patient to ED via [facility] from [facility]. Patient was just discharged from the hospital for shortness of breath. Staff at [facility] state that the patient had been receiving breathing treatments every hour with no relief. Patient was initially 60% on 4L [place] with EMS. EMS gave albuterol  and a duo neb. Patient's O2 sat increased to 93% after medications. Patient has labored breathing and is warm to the touch. Patient is A&O x1.  EMS Vitals: 130 HR 150/72 93% 4L [place] 98.2 oral

## 2024-09-13 NOTE — ED Notes (Signed)
 CCMD called to initiate cardiac monitoring.

## 2024-09-13 NOTE — Procedures (Signed)
 PROCEDURE SUMMARY:  Successful US  guided right thoracentesis. Yielded 600 mL of clear, yellow fluid. Pt tolerated procedure well. No immediate complications.  Specimen was sent for labs. CXR ordered.  EBL < 5 mL  Solmon Selmer Ku PA-C 09/13/2024 11:47 AM

## 2024-09-14 DIAGNOSIS — J9601 Acute respiratory failure with hypoxia: Secondary | ICD-10-CM

## 2024-09-14 LAB — CBC
HCT: 30.6 % — ABNORMAL LOW (ref 39.0–52.0)
Hemoglobin: 9.6 g/dL — ABNORMAL LOW (ref 13.0–17.0)
MCH: 28.5 pg (ref 26.0–34.0)
MCHC: 31.4 g/dL (ref 30.0–36.0)
MCV: 90.8 fL (ref 80.0–100.0)
Platelets: 137 K/uL — ABNORMAL LOW (ref 150–400)
RBC: 3.37 MIL/uL — ABNORMAL LOW (ref 4.22–5.81)
RDW: 17.6 % — ABNORMAL HIGH (ref 11.5–15.5)
WBC: 11.4 K/uL — ABNORMAL HIGH (ref 4.0–10.5)
nRBC: 0.4 % — ABNORMAL HIGH (ref 0.0–0.2)

## 2024-09-14 LAB — BASIC METABOLIC PANEL WITH GFR
Anion gap: 7 (ref 5–15)
BUN: 21 mg/dL (ref 8–23)
CO2: 32 mmol/L (ref 22–32)
Calcium: 7.8 mg/dL — ABNORMAL LOW (ref 8.9–10.3)
Chloride: 98 mmol/L (ref 98–111)
Creatinine, Ser: 0.88 mg/dL (ref 0.61–1.24)
GFR, Estimated: >60 mL/min (ref >60)
Glucose, Bld: 128 mg/dL — ABNORMAL HIGH (ref 70–99)
Potassium: 3.8 mmol/L (ref 3.5–5.1)
Sodium: 138 mmol/L (ref 135–145)

## 2024-09-14 LAB — HEMOGLOBIN AND HEMATOCRIT, BLOOD
HCT: 29.4 % — ABNORMAL LOW (ref 39.0–52.0)
HCT: 30.1 % — ABNORMAL LOW (ref 39.0–52.0)
Hemoglobin: 9.3 g/dL — ABNORMAL LOW (ref 13.0–17.0)
Hemoglobin: 9.3 g/dL — ABNORMAL LOW (ref 13.0–17.0)

## 2024-09-14 MED ORDER — METOPROLOL TARTRATE 25 MG PO TABS
25.0000 mg | ORAL_TABLET | Freq: Two times a day (BID) | ORAL | Status: DC
  Administered 2024-09-14 – 2024-09-15 (×3): 25 mg via ORAL
  Filled 2024-09-14 (×3): qty 1

## 2024-09-14 MED ORDER — METOPROLOL TARTRATE 25 MG PO TABS: 25.0000 mg | ORAL_TABLET | Freq: Two times a day (BID) | ORAL | Status: DC

## 2024-09-14 MED ORDER — METOPROLOL TARTRATE 5 MG/5ML IV SOLN
2.5000 mg | Freq: Once | INTRAVENOUS | Status: AC
  Administered 2024-09-14: 2.5 mg via INTRAVENOUS
  Filled 2024-09-14: qty 5

## 2024-09-14 NOTE — Progress Notes (Signed)
 PROGRESS NOTE    Austin Patterson  FMW:[id number] DOB: 1956/09/19 DOA: 09/13/2024 PCP: Autry Grayce LABOR, PA  Chief Complaint  Patient presents with   Shortness of Breath    Hospital Course:  Austin Patterson is a 68 year old male with hypertension, hyperlipidemia, heart failure with preserved EF, stage IV lung cancer on palliative chemotherapy, asthma, COPD, paroxysmal A-fib.  He was discharged in the hospital 11/25 after being admitted with acute on chronic hypoxic respiratory failure and sepsis due to pneumonia.  He was treated with azithromycin  and Rocephin  during that hospitalization and diuresed adequately to his baseline of 3 L [place]. Patient reports he returned to the hospital as he was not getting along with the staff at his rehab facility.  Per ER documentation EMS was called due to patient dyspnea and was satting 60% on 4 L by EMS. On arrival to the ED he met sepsis criteria with tachycardia, fever, leukocytosis. In the ED he received empiric antibiotics and BiPAP for work of breathing.  He quickly weaned to 6 L.  Subjective: This morning patient reports overall he feels unwell.  Endorsing pain all over.  Telemetry review he has been consistently 110s - 120s.  2.5 mg IVP metoprolol  ordered.   Objective: Vitals:   09/14/24 0400 09/14/24 0500 09/14/24 0600 09/14/24 0732  BP: (!) 119/51 117/62 106/63   Pulse: (!) 108 (!) 114    Resp: 20 20 20    Temp:  98.9 F (37.2 C)    TempSrc:  Oral    SpO2: 100% 97% 96% 98%  Weight:      Height:       No intake or output data in the 24 hours ending 09/14/24 0742 Filed Weights   09/13/24 0306  Weight: 80 kg    Examination: General exam: Appears calm and comfortable, NAD  Respiratory system: Shallow respirations, coarse airway sounds bilaterally, on 5 L nasal cannula Cardiovascular system: Rapid irregular rhythm Gastrointestinal system: Abdomen is nondistended, soft and nontender.  Neuro: Alert and oriented. No focal neurological  deficits.  Assessment & Plan:  Principal Problem:   Acute respiratory failure with hypoxia (HCC) Active Problems:   Sepsis due to pneumonia (HCC)    Sepsis due to pneumonia - Sepsis criteria on arrival tachycardia, fever, leukocytosis - Recently completed empiric course of azithromycin  and Rocephin  and was discharged to rehab facility - Continue with broad-spectrum cefepime , Flagyl , vancomycin  now - Follow-up blood cultures closely  Acute on chronic hypoxic respiratory failure - Multifactorial.  Acute worsening likely due to pneumonia as above complicated by right-sided effusion that is now larger in size - Initially required BiPAP, has now weaned to nasal cannula. - At baseline requires 3 to 4 L [place].  Will wean as tolerated  Stage IV adenocarcinoma of the lung Active chemotherapy - Follows closely with oncology Dr. Babara - Currently receiving outpatient palliative gemcitabine  - Protective precautions ordered - Palliative care consulted  Right-sided pleural effusion - In the setting of active malignancy and CHF. - 11/26 600 cc removed via thoracentesis. F/u CXR improved - 9/3 700 cc also removed - If effusion is recurring more quickly may need to consider pleurx catheter - Continue weaning O2  Chronic anemia - Stable  GERD - Continue PPI  Hyperlipidemia - Statin  Paroxysmal A-fib - Has been on metoprolol  succinate, not anticoagulation at home.  Rate elevated into the 110s-120s this a.m.  Was given metoprolol  2.5 mg IVP x 1 without significant improvement.  Have switched him to metoprolol  to  tartrate 25 twice daily first dose now.  Monitor for improvement.  Keep on telemetry.  Hypertension - Resume home meds gradually, titrate as needed  Body mass index is 27.62 kg/m. Overweight - Outpatient follow up for lifestyle modification and risk factor management   DVT prophylaxis: lovenox    Code Status: Limited: Do not attempt resuscitation (DNR) -DNR-LIMITED -Do Not  Intubate/DNI  Disposition:  Inpatient clinical resolution, eventually return to SNF with palliative care unless he decides to pursue hospice during this admission.  Consultants:    Procedures:    Antimicrobials:  Anti-infectives (From admission, onward)    Start     Dose/Rate Route Frequency Ordered Stop   09/14/24 0600  vancomycin  (VANCOREADY) IVPB 1750 mg/350 mL        1,750 mg 175 mL/hr over 120 Minutes Intravenous Every 24 hours 09/13/24 0745     09/13/24 0800  metroNIDAZOLE  (FLAGYL ) IVPB 500 mg        500 mg 100 mL/hr over 60 Minutes Intravenous Every 12 hours 09/13/24 0732     09/13/24 0800  ceFEPIme  (MAXIPIME ) 2 g in sodium chloride  0.9 % 100 mL IVPB        2 g 200 mL/hr over 30 Minutes Intravenous Every 8 hours 09/13/24 0745     09/13/24 0315  aztreonam  (AZACTAM ) 2 g in sodium chloride  0.9 % 100 mL IVPB        2 g 200 mL/hr over 30 Minutes Intravenous  Once 09/13/24 0305 09/13/24 0405   09/13/24 0315  metroNIDAZOLE  (FLAGYL ) IVPB 500 mg        500 mg 100 mL/hr over 60 Minutes Intravenous  Once 09/13/24 0305 09/13/24 0458   09/13/24 0315  vancomycin  (VANCOCIN ) IVPB 1000 mg/200 mL premix  Status:  Discontinued        1,000 mg 200 mL/hr over 60 Minutes Intravenous  Once 09/13/24 0305 09/13/24 0307   09/13/24 0315  vancomycin  (VANCOREADY) IVPB 1750 mg/350 mL        1,750 mg 175 mL/hr over 120 Minutes Intravenous  Once 09/13/24 0307 09/13/24 0816       Data Reviewed: I have personally reviewed following labs and imaging studies CBC: Recent Labs  Lab 09/10/24 0604 09/11/24 0258 09/12/24 0421 09/13/24 0320 09/14/24 0116 09/14/24 0640  WBC 2.5* 3.5* 6.7 11.4*  --  11.4*  NEUTROABS  --   --   --  8.4*  --   --   HGB 8.6* 9.2* 9.8* 11.2* 9.3* 9.6*  HCT 29.3* 30.5* 32.4* 36.5* 29.4* 30.6*  MCV 95.4 93.0 93.4 90.6  --  90.8  PLT 237 187 185 186  --  137*   Basic Metabolic Panel: Recent Labs  Lab 09/10/24 0604 09/11/24 0258 09/12/24 0421 09/13/24 0320  09/14/24 0640  NA 140 141 138 137 138  K 3.8 4.6 3.8 3.9 3.8  CL 100 100 95* 94* 98  CO2 33* 34* 35* 33* 32  GLUCOSE 90 129* 101* 159* 128*  BUN 22 20 25* 29* 21  CREATININE 0.89 0.91 0.93 0.96 0.88  CALCIUM  8.0* 8.3* 8.5* 9.0 7.8*   GFR: Estimated Creatinine Clearance: 81.5 mL/min (by C-G formula based on SCr of 0.88 mg/dL). Liver Function Tests: Recent Labs  Lab 09/13/24 0320  AST 68*  ALT 92*  ALKPHOS 208*  BILITOT 0.5  PROT 7.3  ALBUMIN 3.8   CBG: Recent Labs  Lab 09/08/24 1133 09/09/24 0724 09/10/24 0840 09/12/24 0821 09/13/24 0311  GLUCAP 168* 81 86 95 152*  Recent Results (from the past 240 hours)  Resp panel by RT-PCR (RSV, Flu A&B, Covid) Anterior Nasal Swab     Status: None   Collection Time: 09/06/24  2:37 PM   Specimen: Anterior Nasal Swab  Result Value Ref Range Status   SARS Coronavirus 2 by RT PCR NEGATIVE NEGATIVE Final    Comment: (NOTE) SARS-CoV-2 target nucleic acids are NOT DETECTED.  The SARS-CoV-2 RNA is generally detectable in upper respiratory specimens during the acute phase of infection. The lowest concentration of SARS-CoV-2 viral copies this assay can detect is 138 copies/mL. A negative result does not preclude SARS-Cov-2 infection and should not be used as the sole basis for treatment or other patient management decisions. A negative result may occur with  improper specimen collection/handling, submission of specimen other than nasopharyngeal swab, presence of viral mutation(s) within the areas targeted by this assay, and inadequate number of viral copies(<138 copies/mL). A negative result must be combined with clinical observations, patient history, and epidemiological information. The expected result is Negative.  Fact Sheet for Patients:  bloggercourse.com  Fact Sheet for Healthcare Providers:  seriousbroker.it  This test is no t yet approved or cleared by the [place]  [place] FDA and  has been authorized for detection and/or diagnosis of SARS-CoV-2 by FDA under an Emergency Use Authorization (EUA). This EUA will remain  in effect (meaning this test can be used) for the duration of the COVID-19 declaration under Section 564(b)(1) of the Act, 21 U.S.C.section 360bbb-3(b)(1), unless the authorization is terminated  or revoked sooner.       Influenza A by PCR NEGATIVE NEGATIVE Final   Influenza B by PCR NEGATIVE NEGATIVE Final    Comment: (NOTE) The Xpert Xpress SARS-CoV-2/FLU/RSV plus assay is intended as an aid in the diagnosis of influenza from Nasopharyngeal swab specimens and should not be used as a sole basis for treatment. Nasal washings and aspirates are unacceptable for Xpert Xpress SARS-CoV-2/FLU/RSV testing.  Fact Sheet for Patients: bloggercourse.com  Fact Sheet for Healthcare Providers: seriousbroker.it  This test is not yet approved or cleared by the [place]  FDA and has been authorized for detection and/or diagnosis of SARS-CoV-2 by FDA under an Emergency Use Authorization (EUA). This EUA will remain in effect (meaning this test can be used) for the duration of the COVID-19 declaration under Section 564(b)(1) of the Act, 21 U.S.C. section 360bbb-3(b)(1), unless the authorization is terminated or revoked.     Resp Syncytial Virus by PCR NEGATIVE NEGATIVE Final    Comment: (NOTE) Fact Sheet for Patients: bloggercourse.com  Fact Sheet for Healthcare Providers: seriousbroker.it  This test is not yet approved or cleared by the [place]  FDA and has been authorized for detection and/or diagnosis of SARS-CoV-2 by FDA under an Emergency Use Authorization (EUA). This EUA will remain in effect (meaning this test can be used) for the duration of the COVID-19 declaration under Section 564(b)(1) of the Act, 21 U.S.C. section  360bbb-3(b)(1), unless the authorization is terminated or revoked.  Performed at [facility], [address] [place], [place] [address]   Blood Culture (routine x 2)     Status: None   Collection Time: 09/06/24  2:38 PM   Specimen: BLOOD  Result Value Ref Range Status   Specimen Description BLOOD BLOOD RIGHT WRIST  Final   Special Requests   Final    BOTTLES DRAWN AEROBIC AND ANAEROBIC Blood Culture results may not be optimal due to an inadequate volume of blood received in culture bottles  Culture   Final    NO GROWTH 5 DAYS Performed at [facility], [address] [place], [place] [address]    Report Status 09/11/2024 FINAL  Final  Blood Culture (routine x 2)     Status: None   Collection Time: 09/06/24  2:43 PM   Specimen: BLOOD  Result Value Ref Range Status   Specimen Description BLOOD LEFT HAND  Final   Special Requests   Final    BOTTLES DRAWN AEROBIC AND ANAEROBIC Blood Culture results may not be optimal due to an inadequate volume of blood received in culture bottles   Culture   Final    NO GROWTH 5 DAYS Performed at [facility], [address] [place], [place] [address]    Report Status 09/11/2024 FINAL  Final  Resp panel by RT-PCR (RSV, Flu A&B, Covid) Anterior Nasal Swab     Status: None   Collection Time: 09/13/24  3:20 AM   Specimen: Anterior Nasal Swab  Result Value Ref Range Status   SARS Coronavirus 2 by RT PCR NEGATIVE NEGATIVE Final    Comment: (NOTE) SARS-CoV-2 target nucleic acids are NOT DETECTED.  The SARS-CoV-2 RNA is generally detectable in upper respiratory specimens during the acute phase of infection. The lowest concentration of SARS-CoV-2 viral copies this assay can detect is 138 copies/mL. A negative result does not preclude SARS-Cov-2 infection and should not be used as the sole basis for treatment or other patient management decisions. A negative result may occur with  improper specimen  collection/handling, submission of specimen other than nasopharyngeal swab, presence of viral mutation(s) within the areas targeted by this assay, and inadequate number of viral copies(<138 copies/mL). A negative result must be combined with clinical observations, patient history, and epidemiological information. The expected result is Negative.  Fact Sheet for Patients:  bloggercourse.com  Fact Sheet for Healthcare Providers:  seriousbroker.it  This test is no t yet approved or cleared by the [place]  FDA and  has been authorized for detection and/or diagnosis of SARS-CoV-2 by FDA under an Emergency Use Authorization (EUA). This EUA will remain  in effect (meaning this test can be used) for the duration of the COVID-19 declaration under Section 564(b)(1) of the Act, 21 U.S.C.section 360bbb-3(b)(1), unless the authorization is terminated  or revoked sooner.       Influenza A by PCR NEGATIVE NEGATIVE Final   Influenza B by PCR NEGATIVE NEGATIVE Final    Comment: (NOTE) The Xpert Xpress SARS-CoV-2/FLU/RSV plus assay is intended as an aid in the diagnosis of influenza from Nasopharyngeal swab specimens and should not be used as a sole basis for treatment. Nasal washings and aspirates are unacceptable for Xpert Xpress SARS-CoV-2/FLU/RSV testing.  Fact Sheet for Patients: bloggercourse.com  Fact Sheet for Healthcare Providers: seriousbroker.it  This test is not yet approved or cleared by the [place]  FDA and has been authorized for detection and/or diagnosis of SARS-CoV-2 by FDA under an Emergency Use Authorization (EUA). This EUA will remain in effect (meaning this test can be used) for the duration of the COVID-19 declaration under Section 564(b)(1) of the Act, 21 U.S.C. section 360bbb-3(b)(1), unless the authorization is terminated or revoked.     Resp Syncytial  Virus by PCR NEGATIVE NEGATIVE Final    Comment: (NOTE) Fact Sheet for Patients: bloggercourse.com  Fact Sheet for Healthcare Providers: seriousbroker.it  This test is not yet approved or cleared by the [place]  FDA and has been authorized for detection and/or diagnosis of SARS-CoV-2  by FDA under an Emergency Use Authorization (EUA). This EUA will remain in effect (meaning this test can be used) for the duration of the COVID-19 declaration under Section 564(b)(1) of the Act, 21 U.S.C. section 360bbb-3(b)(1), unless the authorization is terminated or revoked.  Performed at [facility], [address] [place], [place] [address]   Blood Culture (routine x 2)     Status: None (Preliminary result)   Collection Time: 09/13/24  3:20 AM   Specimen: BLOOD  Result Value Ref Range Status   Specimen Description BLOOD BLOOD RIGHT FOREARM  Final   Special Requests   Final    BOTTLES DRAWN AEROBIC AND ANAEROBIC Blood Culture adequate volume   Culture   Final    NO GROWTH < 12 HOURS Performed at [facility], [address], [place], [place] [address]    Report Status PENDING  Incomplete  Blood Culture (routine x 2)     Status: None (Preliminary result)   Collection Time: 09/13/24  3:20 AM   Specimen: BLOOD  Result Value Ref Range Status   Specimen Description BLOOD BLOOD LEFT FOREARM  Final   Special Requests   Final    BOTTLES DRAWN AEROBIC AND ANAEROBIC Blood Culture adequate volume   Culture   Final    NO GROWTH < 12 HOURS Performed at [facility], [address], [place], [place] [address]    Report Status PENDING  Incomplete  Body fluid culture w Gram Stain     Status: None (Preliminary result)   Collection Time: 09/13/24 10:46 AM   Specimen: PATH Cytology Pleural fluid  Result Value Ref Range Status   Specimen Description   Final    PLEURAL Performed at [facility], [address] [place], [place] [address]    Special Requests   Final    NONE Performed at [facility], [address] [place], [place] [address]    Gram Stain   Final    WBC PRESENT,BOTH PMN AND MONONUCLEAR NO ORGANISMS SEEN CYTOSPIN SMEAR Performed at [facility], [address], [place], [place] [address]    Culture PENDING  Incomplete   Report Status PENDING  Incomplete     Radiology Studies: US  THORACENTESIS ASP PLEURAL SPACE W/IMG GUIDE Result Date: 09/13/2024 INDICATION: 68 year old male with stage IV lung cancer, right-sided pleural effusion. Request for diagnostic and therapeutic thoracentesis. EXAM: ULTRASOUND GUIDED RIGHT THORACENTESIS MEDICATIONS: 10 mL 1% lidocaine  COMPLICATIONS: None immediate. PROCEDURE: An ultrasound guided thoracentesis was thoroughly discussed with the patient and questions answered. The benefits, risks, alternatives and complications were also discussed. The patient understands and wishes to proceed with the procedure. Written consent was obtained. Ultrasound was performed to localize and mark an adequate pocket of fluid in the right chest. The area was then prepped and draped in the normal sterile fashion. 1% Lidocaine  was used for local anesthesia. Under ultrasound guidance a 6 Fr Safe-T-Centesis catheter was introduced. Thoracentesis was performed. The catheter was removed and a dressing applied. FINDINGS: A total of approximately 600 mL of clear, yellow fluid was removed. Samples were sent to the laboratory as requested by the clinical team. IMPRESSION: Successful ultrasound guided right thoracentesis yielding 600 mL of pleural fluid. Performed by: Kacie Matthews PA-C Electronically Signed   By: Wilkie Lent M.D.   On: 09/13/2024 12:11   DG Chest Port 1 View Result Date: 09/13/2024 EXAM: 1 VIEW(S) XRAY OF THE CHEST 09/13/2024 11:15:00 AM COMPARISON: 09/13/2024 CLINICAL HISTORY:  Status post thoracentesis FINDINGS: LINES, TUBES AND DEVICES:  Stable left chest port. Multiple telemetry leads overlie the chest which limits evaluation. LUNGS AND PLEURA: Small right pleural effusion. Interstitial prominence likely related to COPD. Bilateral nodular opacities better appreciated on the same day CTA chest. No pneumothorax. HEART AND MEDIASTINUM: Prominent appearance of the cardiomediastinal silhouette. BONES AND SOFT TISSUES: No acute osseous abnormality. IMPRESSION: 1. Small right pleural effusion, slightly decreased. 2. Bilateral nodular opacities, better appreciated on the same day CTA chest. 3. Interstitial prominence, likely related to COPD. Electronically signed by: Donnice Mania MD 09/13/2024 11:49 AM EST RP Workstation: [id number]   CT Angio Chest PE W and/or Wo Contrast Result Date: 09/13/2024 EXAM: CTA CHEST 09/13/2024 06:13:07 AM TECHNIQUE: CTA of the chest was performed after the administration of 100 mL of iohexol  (OMNIPAQUE ) 350 MG/ML injection. Multiplanar reformatted images are provided for review. MIP images are provided for review. Automated exposure control, iterative reconstruction, and/or weight based adjustment of the mA/kV was utilized to reduce the radiation dose to as low as reasonably achievable. COMPARISON: CT of the chest dated 09/06/2024. CLINICAL HISTORY: Pulmonary embolism (PE) suspected, high prob. FINDINGS: PULMONARY ARTERIES: Pulmonary arteries are adequately opacified for evaluation. There is no evidence of pulmonary embolic disease. Main pulmonary artery is normal in caliber. MEDIASTINUM: The heart is enlarged and there is mild-to-moderate calcific coronary artery disease. There is moderate calcific atheromatous disease within the thoracic aorta. A left internal jugular chest port is present. LYMPH NODES: There is mild mediastinal and bilateral hilar lymphadenopathy, as before. No axillary lymphadenopathy. LUNGS AND PLEURA: There are numerous metastatic nodules again demonstrated throughout the lungs, which have not  changed appreciably in the interim. Large bullous again demonstrated anteriorly within the right lower lobe. There is a mild-to-moderate right-sided pleural effusion, similar to the prior exam. No pneumothorax. No focal consolidation or pulmonary edema. UPPER ABDOMEN: Limited images of the upper abdomen are unremarkable. SOFT TISSUES AND BONES: The patient is status post vertebral augmentation T10 and T6. There are posttraumatic deformities of the scapular spines bilaterally. No acute soft tissue abnormality. IMPRESSION: 1. No evidence of pulmonary embolic disease. 2. Numerous metastatic nodules throughout the lungs, stable compared to prior study. 3. Large bullous changes in the right lower lobe, stable compared to prior study. 4. Mild-to-moderate right-sided pleural effusion, similar to the prior exam. 5. Enlarged heart with mild-to-moderate calcific coronary artery disease. 6. Mild mediastinal and bilateral hilar lymphadenopathy, as before. Electronically signed by: Evalene Coho MD 09/13/2024 06:43 AM EST RP Workstation: [id number]   CT ABDOMEN PELVIS W CONTRAST Result Date: 09/13/2024 EXAM: CT ABDOMEN AND PELVIS WITH CONTRAST 09/13/2024 06:13:07 AM TECHNIQUE: CT of the abdomen and pelvis was performed with the administration of 100 mL of iohexol  (OMNIPAQUE ) 350 MG/ML injection. Multiplanar reformatted images are provided for review. Automated exposure control, iterative reconstruction, and/or weight-based adjustment of the mA/kV was utilized to reduce the radiation dose to as low as reasonably achievable. COMPARISON: CT of the abdomen and pelvis dated 06/12/2024. CLINICAL HISTORY: Sepsis. FINDINGS: LOWER CHEST: Since the previous study, a pleural based mass present posteriorly along the left lung base has increased in size from approximately 5.2 x 3.9 cm to approximately 6.5 x 3.9 cm. An ovoid lesion previously noted anteriorly on the right has also increased in size in the interim from 3.7 x 3.2 cm to  approximately 4.4 x 3.5 cm. The patient has also developed numerous metastatic nodules within the lung bases, primarily on the left. There are at least 15  new nodules with the largest measuring 15 mm in diameter, which is seen within the lingula on image 13 of series 4, measuring 15 mm in diameter. An irregular nodular lesion previously noted laterally within the right lower lobe has also enlarged. There is a mild-to-moderate right-sided pleural effusion. LIVER: There has been interval development of at least 3 new metastatic lesions within segment 7 of the liver, with the largest seen on image 14 of series 2, measuring approximately 19 x 18 mm. GALLBLADDER AND BILE DUCTS: Gallbladder is unremarkable. No biliary ductal dilatation. SPLEEN: No acute abnormality. PANCREAS: No acute abnormality. ADRENAL GLANDS: Bilateral adrenal nodules again demonstrated, which are unchanged in the interim. KIDNEYS, URETERS AND BLADDER: There is mild bilateral renal cortical atrophy. No stones in the kidneys or ureters. No hydronephrosis. No perinephric or periureteral stranding. Urinary bladder is unremarkable. GI AND BOWEL: Stomach demonstrates no acute abnormality. There is no bowel obstruction. PERITONEUM AND RETROPERITONEUM: No ascites. No free air. VASCULATURE: The abdominal aorta is normal in caliber, but densely calcified. LYMPH NODES: No lymphadenopathy. REPRODUCTIVE ORGANS: No acute abnormality. BONES AND SOFT TISSUES: There is mild-to-moderate levoscoliosis of the thoracolumbar spine. The patient is status post vertebral augmentation at T10. There is no convincing evidence of osseous metastatic disease. No focal soft tissue abnormality. IMPRESSION: 1. Interval increase in size of pleural-based left basilar mass and right anterior ovoid pleural lesion with development of numerous new pulmonary metastatic nodules (at least 15, largest 15 mm in the lingula) and enlargement of a previously noted irregular right lower lobe  nodule, consistent with progressive thoracic metastatic disease; mild-to-moderate right pleural effusion. 2. Interval development of at least three new hepatic metastatic lesions in segment 7, largest 19 x 18 mm, consistent with progressive hepatic metastatic disease. 3. No convincing evidence of osseous metastatic disease. Electronically signed by: Evalene Coho MD 09/13/2024 06:36 AM EST RP Workstation: [id number]   CT HEAD WO CONTRAST ( ) Result Date: 09/13/2024 EXAM: CT HEAD WITHOUT 09/13/2024 06:13:07 AM TECHNIQUE: CT of the head was performed without the administration of intravenous contrast. Automated exposure control, iterative reconstruction, and/or weight based adjustment of the mA/kV was utilized to reduce the radiation dose to as low as reasonably achievable. COMPARISON: CT of the head dated 05/12/2023. CLINICAL HISTORY: Mental status change, unknown cause. FINDINGS: BRAIN AND VENTRICLES: No acute intracranial hemorrhage. No mass effect or midline shift. No extra-axial fluid collection. No evidence of acute infarct. No hydrocephalus. Patchy white matter hypodensities, compatible with chronic microvascular ischemic disease. Calcific atherosclerosis. ORBITS: No acute abnormality. SINUSES AND MASTOIDS: Mild polypoid mucosal disease within the floor of the left maxillary sinus. SOFT TISSUES AND SKULL: No acute skull fracture. No acute soft tissue abnormality. IMPRESSION: 1. No acute intracranial abnormality. 2. Patchy white matter hypodensities, compatible with chronic microvascular ischemic disease. 3. Calcific atherosclerosis. 4. Mild polypoid mucosal disease within the floor of the left maxillary sinus. Electronically signed by: Evalene Coho MD 09/13/2024 06:16 AM EST RP Workstation: [id number]   DG Chest Port 1 View Result Date: 09/13/2024 EXAM: 1 VIEW(S) XRAY OF THE CHEST 09/13/2024 03:27:00 AM COMPARISON: 09/06/2024 CLINICAL HISTORY: Possible sepsis and shortness of breath FINDINGS:  LINES, TUBES AND DEVICES: Left chest port stable in place. LUNGS AND PLEURA: Decreased right pleural effusion, now small. Diffuse interstitial prominence. Unchanged patchy nodular densities at the right middle lobe and medial left lung base. Stable nodular densities at the left lung base. No pneumothorax. HEART AND MEDIASTINUM: No acute abnormality of the cardiac and mediastinal silhouettes. BONES AND SOFT  TISSUES: Skin fold is noted over the right chest. No acute osseous abnormality. IMPRESSION: 1. Decreased right pleural effusion, now small. 2. Diffuse interstitial prominence. 3. Unchanged patchy nodular densities at the right middle lobe and medial left lung base. Electronically signed by: Oneil Devonshire MD 09/13/2024 03:30 AM EST RP Workstation: [id number]    Scheduled Meds:  amLODipine   5 mg Oral QHS   aspirin  EC  81 mg Oral Daily   atorvastatin   40 mg Oral QHS   clopidogrel   75 mg Oral Daily   enoxaparin  (LOVENOX ) injection  40 mg Subcutaneous Q24H   furosemide   20 mg Oral Daily   gabapentin   300 mg Oral TID   ipratropium-albuterol   3 mL Nebulization QID   magnesium  oxide  400 mg Oral Daily   metoprolol  succinate  50 mg Oral Daily   montelukast   10 mg Oral Daily   pantoprazole   40 mg Oral Daily   Continuous Infusions:  ceFEPime  (MAXIPIME ) IV 2 g (09/14/24 0120)   metronidazole  500 mg (09/14/24 0016)   vancomycin  1,750 mg (09/14/24 0600)     LOS: 1 day  MDM: Patient is high risk for one or more organ failure.  They necessitate ongoing hospitalization for continued IV therapies and subsequent lab monitoring. Total time spent interpreting labs and vitals, reviewing the medical record, coordinating care amongst consultants and care team members, directly assessing and discussing care with the patient and/or family: 55 min  Richar Dunklee, DO [facility]  To contact the attending physician between 7A-7P please use Epic Chat. To contact the covering physician during after hours  7P-7A, please review Amion.  09/14/2024, 7:42 AM   *This document has been created with the assistance of dictation software. Please excuse typographical errors. *

## 2024-09-14 NOTE — Progress Notes (Signed)
 [facility] Room 238 [facility] Liaison Note  This patient has a pending palliative care referral for follow up at [facility].  [facility] will follow through discharge disposition.  Please call with any palliative care questions.  Thank you for the opportunity to participate in this patient's care  [facility]asion [phone]

## 2024-09-15 DIAGNOSIS — J9601 Acute respiratory failure with hypoxia: Secondary | ICD-10-CM | POA: Diagnosis not present

## 2024-09-15 LAB — COMPREHENSIVE METABOLIC PANEL WITH GFR
ALT: 39 U/L (ref 0–44)
AST: 33 U/L (ref 15–41)
Albumin: 2.8 g/dL — ABNORMAL LOW (ref 3.5–5.0)
Alkaline Phosphatase: 165 U/L — ABNORMAL HIGH (ref 38–126)
Anion gap: 9 (ref 5–15)
BUN: 21 mg/dL (ref 8–23)
CO2: 30 mmol/L (ref 22–32)
Calcium: 7.6 mg/dL — ABNORMAL LOW (ref 8.9–10.3)
Chloride: 97 mmol/L — ABNORMAL LOW (ref 98–111)
Creatinine, Ser: 0.82 mg/dL (ref 0.61–1.24)
GFR, Estimated: >60 mL/min (ref >60)
Glucose, Bld: 125 mg/dL — ABNORMAL HIGH (ref 70–99)
Potassium: 3.6 mmol/L (ref 3.5–5.1)
Sodium: 136 mmol/L (ref 135–145)
Total Bilirubin: 0.4 mg/dL (ref 0.0–1.2)
Total Protein: 5.9 g/dL — ABNORMAL LOW (ref 6.5–8.1)

## 2024-09-15 LAB — CBC WITH DIFFERENTIAL/PLATELET
Abs Immature Granulocytes: 0.18 K/uL — ABNORMAL HIGH (ref 0.00–0.07)
Basophils Absolute: 0 K/uL (ref 0.0–0.1)
Basophils Relative: 0 %
Eosinophils Absolute: 0 K/uL (ref 0.0–0.5)
Eosinophils Relative: 0 %
HCT: 28.5 % — ABNORMAL LOW (ref 39.0–52.0)
Hemoglobin: 8.9 g/dL — ABNORMAL LOW (ref 13.0–17.0)
Immature Granulocytes: 1 %
Lymphocytes Relative: 2 %
Lymphs Abs: 0.2 K/uL — ABNORMAL LOW (ref 0.7–4.0)
MCH: 28.3 pg (ref 26.0–34.0)
MCHC: 31.2 g/dL (ref 30.0–36.0)
MCV: 90.5 fL (ref 80.0–100.0)
Monocytes Absolute: 2.1 K/uL — ABNORMAL HIGH (ref 0.1–1.0)
Monocytes Relative: 15 %
Neutro Abs: 11.2 K/uL — ABNORMAL HIGH (ref 1.7–7.7)
Neutrophils Relative %: 82 %
Platelets: 149 K/uL — ABNORMAL LOW (ref 150–400)
RBC: 3.15 MIL/uL — ABNORMAL LOW (ref 4.22–5.81)
RDW: 17.8 % — ABNORMAL HIGH (ref 11.5–15.5)
WBC: 13.7 K/uL — ABNORMAL HIGH (ref 4.0–10.5)
nRBC: 0 % (ref 0.0–0.2)

## 2024-09-15 LAB — PHOSPHORUS: Phosphorus: 2.3 mg/dL — ABNORMAL LOW (ref 2.5–4.6)

## 2024-09-15 LAB — MRSA NEXT GEN BY PCR, NASAL: MRSA by PCR Next Gen: NOT DETECTED

## 2024-09-15 LAB — MAGNESIUM: Magnesium: 1.4 mg/dL — ABNORMAL LOW (ref 1.7–2.4)

## 2024-09-15 MED ORDER — LACTULOSE 10 GM/15ML PO SOLN
10.0000 g | Freq: Three times a day (TID) | ORAL | Status: DC
  Administered 2024-09-15 – 2024-09-20 (×8): 10 g via ORAL
  Filled 2024-09-15 (×12): qty 30

## 2024-09-15 MED ORDER — POTASSIUM PHOSPHATES 15 MMOLE/5ML IV SOLN
15.0000 mmol | Freq: Once | INTRAVENOUS | Status: AC
  Administered 2024-09-15: 15 mmol via INTRAVENOUS
  Filled 2024-09-15: qty 5

## 2024-09-15 MED ORDER — METOPROLOL TARTRATE 50 MG PO TABS
50.0000 mg | ORAL_TABLET | Freq: Two times a day (BID) | ORAL | Status: DC
  Administered 2024-09-15 – 2024-09-20 (×10): 50 mg via ORAL
  Filled 2024-09-15 (×10): qty 1

## 2024-09-15 MED ORDER — MAGNESIUM SULFATE 4 GM/100ML IV SOLN
4.0000 g | Freq: Once | INTRAVENOUS | Status: AC
  Administered 2024-09-15: 4 g via INTRAVENOUS
  Filled 2024-09-15: qty 100

## 2024-09-15 MED ORDER — OYSTER SHELL CALCIUM/D3 500-5 MG-MCG PO TABS
1.0000 | ORAL_TABLET | Freq: Two times a day (BID) | ORAL | Status: DC
  Administered 2024-09-15 – 2024-09-17 (×5): 1 via ORAL
  Filled 2024-09-15 (×5): qty 1

## 2024-09-15 MED ORDER — IPRATROPIUM-ALBUTEROL 0.5-2.5 (3) MG/3ML IN SOLN
3.0000 mL | Freq: Four times a day (QID) | RESPIRATORY_TRACT | Status: DC
  Administered 2024-09-15 – 2024-09-17 (×9): 3 mL via RESPIRATORY_TRACT
  Filled 2024-09-15 (×9): qty 3

## 2024-09-15 MED ORDER — METOPROLOL TARTRATE 25 MG PO TABS
25.0000 mg | ORAL_TABLET | Freq: Once | ORAL | Status: AC
  Administered 2024-09-15: 25 mg via ORAL
  Filled 2024-09-15: qty 1

## 2024-09-15 MED ORDER — MUSCLE RUB 10-15 % EX CREA
TOPICAL_CREAM | CUTANEOUS | Status: DC | PRN
  Administered 2024-09-15: 1 via TOPICAL
  Filled 2024-09-15: qty 85

## 2024-09-15 NOTE — Consult Note (Signed)
 Consultation Note Date: 09/15/2024 at 1200  Patient Name: Austin Patterson  DOB: Dec 21, 1955  MRN: [id number]  Age / Sex: 68 y.o., male  PCP: Autry Grayce LABOR, PA Referring Physician: Leesa Kast, DO  HPI/Patient Profile: From Admitting MD H&P: 68 y.o. male with medical history significant for hypertension, hyperlipidemia, heart failure with preserved EF, stage IV lung cancer on palliative chemotherapy, asthma, COPD and paroxysmal A-fib just discharged on 11/25 being admitted to the hospital with recurrent acute on chronic hypoxic respiratory failure and sepsis likely due to continued pneumonia.  He was treated during this last hospitalization with empiric IV azithromycin  and Rocephin , completed a course.  He was diuresed due to his heart failure, and was noted to be euvolemic on his baseline 3 to 4 L nasal cannula oxygen  upon discharge.  Per the patient, he returned back to the hospital because he was not getting along with the staff, who he says had an attitude.  When asked, he denies any cough, shortness of breath or any significant changes.  However per ER documentation, EMS was called from his rehab facility due to patient being very short of breath without relief despite hourly breathing treatments, he was noted to be saturating 60% on 4 L nasal cannula oxygen  by EMS.  In the emergency department, patient received empiric IV antibiotics, was placed on BiPAP for work of breathing and is now significantly improved with normal saturations on 6 L nasal cannula.   Clinical Assessment and Goals of Care: Extensive chart review completed prior to meeting patient including labs, vital signs, imaging, progress notes, orders, and available advanced directive documents from current and previous encounters. I then met with patient, his friend, friend's wife, and other close friend to whom patient refers to as his chosen  mother  to discuss diagnosis prognosis, GOC, EOL wishes, disposition and options.  I highlighted to patient that he has met with my colleague Dr. Oneita last week during a previous hospitalization.  He remembers meeting with Dr. Oneita and is aware of PMT's role on his care team.  We discussed boundaries and goals of care as outlined with Dr. Oneita.  Patient continues to endorse DNR with limited interventions.  The HCPOA paperwork remains valid as he wishes for his son to be his next of kin and surrogate decision maker.  After reviewing the patient's chart and assessing the patient at bedside, I spoke with patient in regards to symptom management and goals of care.   Symptoms assessed.  Patient endorses that he becomes short of breath easily and feels exhausted after minimal conversation.  He denies shortness of breath, chest pain, and other acute physical ailments at this time.  However, he endorses that he does not have long.  He feels like this might be it for me.  Space and opportunity provided for patient to share his thoughts and emotions regarding his current medical situation. Human mortality, limitations of earthly body discussed.   He speaks of how he does not feel better, and has not felt  better for several weeks now.  Discussed pneumonia as a significant attack on the immune system that is compounded by patient's ongoing stage IV lung cancer.  She shares understanding that pneumonia is difficult for an average healthy person to recover from.  Additionally, he shares that his lungs are shot and that is making it extremely difficult for him to recover.  Discussed being free from suffering, comfort focused care, and avoiding aggressive medical treatments with the understanding that patient's underlying comorbidities are contributing to his overall poor prognosis.  He shares that he would like to speak with his family before making any changes to his plan of care.  He wants to continue  with current plan of care to treat the treatable with IV antibiotics, electrolyte replacement, and other medical management.  However, he would like to speak with his family about next steps.  His children live out of state.  Specifically, his son Austin Patterson lives in [place]  and patient is hopeful to probable travel to visit with him soon.  With his permission, I attempted to speak with his son/HCPOA Austin Patterson over the phone.  No answer.  HIPAA compliant voicemail left.  Reviewed with patient that plan will not change given our discussions.  However, if patient continues to have further deterioration or feels like he is declining that is well within his right to choose comfort focused measures. He endorses understanding and appreciation.  PMT will continue to follow and support closely.  Primary Decision Maker PATIENT  Physical Exam Vitals reviewed.  Constitutional:      General: He is not in acute distress.    Appearance: He is normal weight.  HENT:     Head: Normocephalic.  Cardiovascular:     Rate and Rhythm: Tachycardia present.  Pulmonary:     Breath sounds: Examination of the right-lower field reveals decreased breath sounds. Examination of the left-lower field reveals decreased breath sounds. Decreased breath sounds present.  Musculoskeletal:     Comments: Generalized weakness  Skin:    General: Skin is warm and dry.  Neurological:     Mental Status: He is alert and oriented to person, place, and time.  Psychiatric:        Mood and Affect: Mood normal. Mood is not anxious.        Behavior: Behavior normal. Behavior is not agitated.     Palliative Assessment/Data: 30%     Thank you for this consult. Palliative medicine will continue to follow and assist holistically.   75 minute visit includes: Detailed review of medical records (labs, imaging, vital signs), medically appropriate exam (mental status, respiratory, cardiac, skin), discussed with treatment team, counseling and  educating patient, family and staff, documenting clinical information, medication management and coordination of care.  Signed by: Lamarr Gunner, DNP, FNP-BC Palliative Medicine   Please contact Palliative Medicine Team providers via [facility] for questions and concerns.

## 2024-09-15 NOTE — Progress Notes (Signed)
 PROGRESS NOTE    Austin Patterson  FMW:[id number] DOB: 05-19-1956 DOA: 09/13/2024 PCP: Autry Grayce LABOR, PA  Chief Complaint  Patient presents with   Shortness of Breath    Hospital Course:  Austin Patterson is a 68 year old male with hypertension, hyperlipidemia, heart failure with preserved EF, stage IV lung cancer on palliative chemotherapy, asthma, COPD, paroxysmal A-fib.  He was discharged in the hospital 11/25 after being admitted with acute on chronic hypoxic respiratory failure and sepsis due to pneumonia.  He was treated with azithromycin  and Rocephin  during that hospitalization and diuresed adequately to his baseline of 3 L [place]. Patient reports he returned to the hospital as he was not getting along with the staff at his rehab facility.  Per ER documentation EMS was called due to patient dyspnea and was satting 60% on 4 L by EMS. On arrival to the ED he met sepsis criteria with tachycardia, fever, leukocytosis. In the ED he received empiric antibiotics and BiPAP for work of breathing.  He quickly weaned to 6 L. He subsequently underwent thoracentesis on 11/26 which removed 600 cc from right lung.  Follow-up CXR is much improved.  Subjective: This morning patient is still very dyspneic and tachypneic when speaking.  We discussed my concerns about his advanced lung cancer and persistent oxygen  needs.  We discussed that moving forward he may have a higher oxygen  requirement and is typically compatible with a skilled nursing facility, or any outpatient care.  He reports at this time he is not interested in hospice but he will discuss this with his son.  Palliative care has also been consulted.  Objective: Vitals:   09/15/24 0600 09/15/24 0700 09/15/24 0746 09/15/24 0800  BP: 100/66 113/71  104/63  Pulse: 99 97  95  Resp: (!) 22 (!) 24  17  Temp: 98.6 F (37 C) 98 F (36.7 C)    TempSrc: Axillary Axillary    SpO2: 94% 95% 92% 95%  Weight:      Height:        Intake/Output  Summary (Last 24 hours) at 09/15/2024 0847 Last data filed at 09/15/2024 0511 Gross per 24 hour  Intake 1775.98 ml  Output 1500 ml  Net 275.98 ml   Filed Weights   09/13/24 0306  Weight: 80 kg    Examination: General exam: Appears calm and comfortable, NAD  Respiratory system: Shallow respirations, tachypnea, dyspneic when speaking, coarse airway sounds bilaterally, on 6 L [place]. Cardiovascular system: Rapid irregular rhythm Gastrointestinal system: Abdomen is nondistended, soft and nontender.  Neuro: Alert and oriented. No focal neurological deficits.  Assessment & Plan:  Principal Problem:   Acute respiratory failure with hypoxia (HCC) Active Problems:   Sepsis due to pneumonia (HCC)    Sepsis due to pneumonia - Sepsis criteria on arrival tachycardia, fever, leukocytosis.  Symptoms complicated by active malignancy. - Recently completed empiric course of azithromycin  and Rocephin  and was discharged to rehab facility - Given difficulty differentiating worsening pneumonia versus active malignancy we will continue with broad-spectrum cefepime , Flagyl  - Initially started on vancomycin  but MRSA PCR is negative.  Will discontinue. - Blood cultures remain negative - Persistent leukocytosis  Hypocalcemia Hypophosphatemia Hypomagnesemia - Electrolyte disturbances secondary to malignancy with infection - Calcium  corrects to 8.6 - Will replace all - Continue to monitor daily CMP/mag/Phos  Acute on chronic hypoxic respiratory failure - Multifactorial.  Acute worsening likely due to pneumonia as above complicated by right-sided effusion that is now larger in size.  Improving status post  thoracentesis - Initially required BiPAP, has now weaned to nasal cannula.  Still slightly higher than baseline around 6 L today - Continue to wean as tolerated towards goal of 3 L.  Unfortunately patient's respiratory status is gradually declining and he may be nearing a new baseline.  Stage IV  adenocarcinoma of the lung Active chemotherapy - Follows closely with oncology Dr. Babara - Currently receiving outpatient palliative gemcitabine  - Protective precautions ordered - Palliative care consulted, patient was followed by [facility] care palliative care outpatient.  May benefit from hospice services.  Per my discussion today patient is not ready for hospice but he will consider this and discuss it with his son.  Right-sided pleural effusion - In the setting of active malignancy and CHF. - 11/26 600 cc removed via thoracentesis. F/u CXR improved - 9/3 700 cc also removed - If effusion is recurring more quickly may need to consider pleurx catheter - Continue weaning O2  Chronic anemia - Stable  GERD - Continue PPI  Hyperlipidemia - Statin  Paroxysmal A-fib, with RVR - Has been on metoprolol  succinate, not on anticoagulation at home. - RVR into the 120s 11/27.  Minimal resolution with metoprolol  IVP.  Started on metoprolol  tartrate with improvement on 11/27.  Rate rising again today.  Have increased heart metoprolol .  Continue monitor closely on telemetry.  Hypertension - Resume home meds gradually, titrate as needed  Body mass index is 27.62 kg/m. Overweight - Outpatient follow up for lifestyle modification and risk factor management   DVT prophylaxis: lovenox    Code Status: Limited: Do not attempt resuscitation (DNR) -DNR-LIMITED -Do Not Intubate/DNI  Disposition:  Inpatient clinical resolution, eventually return to SNF with palliative care unless he decides to pursue hospice during this admission.  Consultants:    Procedures:    Antimicrobials:  Anti-infectives (From admission, onward)    Start     Dose/Rate Route Frequency Ordered Stop   09/14/24 0600  vancomycin  (VANCOREADY) IVPB 1750 mg/350 mL        1,750 mg 175 mL/hr over 120 Minutes Intravenous Every 24 hours 09/13/24 0745     09/13/24 0800  metroNIDAZOLE  (FLAGYL ) IVPB 500 mg        500 mg 100 mL/hr over  60 Minutes Intravenous Every 12 hours 09/13/24 0732     09/13/24 0800  ceFEPIme  (MAXIPIME ) 2 g in sodium chloride  0.9 % 100 mL IVPB        2 g 200 mL/hr over 30 Minutes Intravenous Every 8 hours 09/13/24 0745     09/13/24 0315  aztreonam  (AZACTAM ) 2 g in sodium chloride  0.9 % 100 mL IVPB        2 g 200 mL/hr over 30 Minutes Intravenous  Once 09/13/24 0305 09/13/24 0405   09/13/24 0315  metroNIDAZOLE  (FLAGYL ) IVPB 500 mg        500 mg 100 mL/hr over 60 Minutes Intravenous  Once 09/13/24 0305 09/13/24 0458   09/13/24 0315  vancomycin  (VANCOCIN ) IVPB 1000 mg/200 mL premix  Status:  Discontinued        1,000 mg 200 mL/hr over 60 Minutes Intravenous  Once 09/13/24 0305 09/13/24 0307   09/13/24 0315  vancomycin  (VANCOREADY) IVPB 1750 mg/350 mL        1,750 mg 175 mL/hr over 120 Minutes Intravenous  Once 09/13/24 0307 09/13/24 0816       Data Reviewed: I have personally reviewed following labs and imaging studies CBC: Recent Labs  Lab 09/11/24 0258 09/12/24 0421 09/13/24 0320 09/14/24 0116  09/14/24 0640 09/14/24 1310 09/15/24 0431  WBC 3.5* 6.7 11.4*  --  11.4*  --  13.7*  NEUTROABS  --   --  8.4*  --   --   --  11.2*  HGB 9.2* 9.8* 11.2* 9.3* 9.6* 9.3* 8.9*  HCT 30.5* 32.4* 36.5* 29.4* 30.6* 30.1* 28.5*  MCV 93.0 93.4 90.6  --  90.8  --  90.5  PLT 187 185 186  --  137*  --  149*   Basic Metabolic Panel: Recent Labs  Lab 09/11/24 0258 09/12/24 0421 09/13/24 0320 09/14/24 0640 09/15/24 0431  NA 141 138 137 138 136  K 4.6 3.8 3.9 3.8 3.6  CL 100 95* 94* 98 97*  CO2 34* 35* 33* 32 30  GLUCOSE 129* 101* 159* 128* 125*  BUN 20 25* 29* 21 21  CREATININE 0.91 0.93 0.96 0.88 0.82  CALCIUM  8.3* 8.5* 9.0 7.8* 7.6*  MG  --   --   --   --  1.4*  PHOS  --   --   --   --  2.3*   GFR: Estimated Creatinine Clearance: 87.4 mL/min (by C-G formula based on SCr of 0.82 mg/dL). Liver Function Tests: Recent Labs  Lab 09/13/24 0320 09/15/24 0431  AST 68* 33  ALT 92* 39  ALKPHOS  208* 165*  BILITOT 0.5 0.4  PROT 7.3 5.9*  ALBUMIN 3.8 2.8*   CBG: Recent Labs  Lab 09/08/24 1133 09/09/24 0724 09/10/24 0840 09/12/24 0821 09/13/24 0311  GLUCAP 168* 81 86 95 152*    Recent Results (from the past 240 hours)  Resp panel by RT-PCR (RSV, Flu A&B, Covid) Anterior Nasal Swab     Status: None   Collection Time: 09/06/24  2:37 PM   Specimen: Anterior Nasal Swab  Result Value Ref Range Status   SARS Coronavirus 2 by RT PCR NEGATIVE NEGATIVE Final    Comment: (NOTE) SARS-CoV-2 target nucleic acids are NOT DETECTED.  The SARS-CoV-2 RNA is generally detectable in upper respiratory specimens during the acute phase of infection. The lowest concentration of SARS-CoV-2 viral copies this assay can detect is 138 copies/mL. A negative result does not preclude SARS-Cov-2 infection and should not be used as the sole basis for treatment or other patient management decisions. A negative result may occur with  improper specimen collection/handling, submission of specimen other than nasopharyngeal swab, presence of viral mutation(s) within the areas targeted by this assay, and inadequate number of viral copies(<138 copies/mL). A negative result must be combined with clinical observations, patient history, and epidemiological information. The expected result is Negative.  Fact Sheet for Patients:  bloggercourse.com  Fact Sheet for Healthcare Providers:  seriousbroker.it  This test is no t yet approved or cleared by the [place]  FDA and  has been authorized for detection and/or diagnosis of SARS-CoV-2 by FDA under an Emergency Use Authorization (EUA). This EUA will remain  in effect (meaning this test can be used) for the duration of the COVID-19 declaration under Section 564(b)(1) of the Act, 21 U.S.C.section 360bbb-3(b)(1), unless the authorization is terminated  or revoked sooner.       Influenza A by PCR NEGATIVE  NEGATIVE Final   Influenza B by PCR NEGATIVE NEGATIVE Final    Comment: (NOTE) The Xpert Xpress SARS-CoV-2/FLU/RSV plus assay is intended as an aid in the diagnosis of influenza from Nasopharyngeal swab specimens and should not be used as a sole basis for treatment. Nasal washings and aspirates are unacceptable for Xpert Xpress SARS-CoV-2/FLU/RSV  testing.  Fact Sheet for Patients: bloggercourse.com  Fact Sheet for Healthcare Providers: seriousbroker.it  This test is not yet approved or cleared by the [place]  FDA and has been authorized for detection and/or diagnosis of SARS-CoV-2 by FDA under an Emergency Use Authorization (EUA). This EUA will remain in effect (meaning this test can be used) for the duration of the COVID-19 declaration under Section 564(b)(1) of the Act, 21 U.S.C. section 360bbb-3(b)(1), unless the authorization is terminated or revoked.     Resp Syncytial Virus by PCR NEGATIVE NEGATIVE Final    Comment: (NOTE) Fact Sheet for Patients: bloggercourse.com  Fact Sheet for Healthcare Providers: seriousbroker.it  This test is not yet approved or cleared by the [place]  FDA and has been authorized for detection and/or diagnosis of SARS-CoV-2 by FDA under an Emergency Use Authorization (EUA). This EUA will remain in effect (meaning this test can be used) for the duration of the COVID-19 declaration under Section 564(b)(1) of the Act, 21 U.S.C. section 360bbb-3(b)(1), unless the authorization is terminated or revoked.  Performed at [facility], [address] [place], [place] [address]   Blood Culture (routine x 2)     Status: None   Collection Time: 09/06/24  2:38 PM   Specimen: BLOOD  Result Value Ref Range Status   Specimen Description BLOOD BLOOD RIGHT WRIST  Final   Special Requests   Final    BOTTLES DRAWN AEROBIC AND ANAEROBIC Blood  Culture results may not be optimal due to an inadequate volume of blood received in culture bottles   Culture   Final    NO GROWTH 5 DAYS Performed at [facility], [address] [place], [place] [address]    Report Status 09/11/2024 FINAL  Final  Blood Culture (routine x 2)     Status: None   Collection Time: 09/06/24  2:43 PM   Specimen: BLOOD  Result Value Ref Range Status   Specimen Description BLOOD LEFT HAND  Final   Special Requests   Final    BOTTLES DRAWN AEROBIC AND ANAEROBIC Blood Culture results may not be optimal due to an inadequate volume of blood received in culture bottles   Culture   Final    NO GROWTH 5 DAYS Performed at [facility], [address] [place], [place] [address]    Report Status 09/11/2024 FINAL  Final  Resp panel by RT-PCR (RSV, Flu A&B, Covid) Anterior Nasal Swab     Status: None   Collection Time: 09/13/24  3:20 AM   Specimen: Anterior Nasal Swab  Result Value Ref Range Status   SARS Coronavirus 2 by RT PCR NEGATIVE NEGATIVE Final    Comment: (NOTE) SARS-CoV-2 target nucleic acids are NOT DETECTED.  The SARS-CoV-2 RNA is generally detectable in upper respiratory specimens during the acute phase of infection. The lowest concentration of SARS-CoV-2 viral copies this assay can detect is 138 copies/mL. A negative result does not preclude SARS-Cov-2 infection and should not be used as the sole basis for treatment or other patient management decisions. A negative result may occur with  improper specimen collection/handling, submission of specimen other than nasopharyngeal swab, presence of viral mutation(s) within the areas targeted by this assay, and inadequate number of viral copies(<138 copies/mL). A negative result must be combined with clinical observations, patient history, and epidemiological information. The expected result is Negative.  Fact Sheet for Patients:  bloggercourse.com  Fact  Sheet for Healthcare Providers:  seriousbroker.it  This test is no t yet  approved or cleared by the [place]  FDA and  has been authorized for detection and/or diagnosis of SARS-CoV-2 by FDA under an Emergency Use Authorization (EUA). This EUA will remain  in effect (meaning this test can be used) for the duration of the COVID-19 declaration under Section 564(b)(1) of the Act, 21 U.S.C.section 360bbb-3(b)(1), unless the authorization is terminated  or revoked sooner.       Influenza A by PCR NEGATIVE NEGATIVE Final   Influenza B by PCR NEGATIVE NEGATIVE Final    Comment: (NOTE) The Xpert Xpress SARS-CoV-2/FLU/RSV plus assay is intended as an aid in the diagnosis of influenza from Nasopharyngeal swab specimens and should not be used as a sole basis for treatment. Nasal washings and aspirates are unacceptable for Xpert Xpress SARS-CoV-2/FLU/RSV testing.  Fact Sheet for Patients: bloggercourse.com  Fact Sheet for Healthcare Providers: seriousbroker.it  This test is not yet approved or cleared by the [place]  FDA and has been authorized for detection and/or diagnosis of SARS-CoV-2 by FDA under an Emergency Use Authorization (EUA). This EUA will remain in effect (meaning this test can be used) for the duration of the COVID-19 declaration under Section 564(b)(1) of the Act, 21 U.S.C. section 360bbb-3(b)(1), unless the authorization is terminated or revoked.     Resp Syncytial Virus by PCR NEGATIVE NEGATIVE Final    Comment: (NOTE) Fact Sheet for Patients: bloggercourse.com  Fact Sheet for Healthcare Providers: seriousbroker.it  This test is not yet approved or cleared by the [place]  FDA and has been authorized for detection and/or diagnosis of SARS-CoV-2 by FDA under an Emergency Use Authorization (EUA). This EUA will remain in effect  (meaning this test can be used) for the duration of the COVID-19 declaration under Section 564(b)(1) of the Act, 21 U.S.C. section 360bbb-3(b)(1), unless the authorization is terminated or revoked.  Performed at [facility], [address] [place], [place] [address]   Blood Culture (routine x 2)     Status: None (Preliminary result)   Collection Time: 09/13/24  3:20 AM   Specimen: BLOOD  Result Value Ref Range Status   Specimen Description BLOOD BLOOD RIGHT FOREARM  Final   Special Requests   Final    BOTTLES DRAWN AEROBIC AND ANAEROBIC Blood Culture adequate volume   Culture   Final    NO GROWTH 2 DAYS Performed at [facility], [address] [place], [place] [address]    Report Status PENDING  Incomplete  Blood Culture (routine x 2)     Status: None (Preliminary result)   Collection Time: 09/13/24  3:20 AM   Specimen: BLOOD  Result Value Ref Range Status   Specimen Description BLOOD BLOOD LEFT FOREARM  Final   Special Requests   Final    BOTTLES DRAWN AEROBIC AND ANAEROBIC Blood Culture adequate volume   Culture   Final    NO GROWTH 2 DAYS Performed at [facility], [address] [place], [place] [address]    Report Status PENDING  Incomplete  Body fluid culture w Gram Stain     Status: None (Preliminary result)   Collection Time: 09/13/24 10:46 AM   Specimen: PATH Cytology Pleural fluid  Result Value Ref Range Status   Specimen Description   Final    PLEURAL Performed at [facility], [address], [place], [place] [address]    Special Requests   Final    NONE Performed at [facility], [address], [place], [place] [address]  Gram Stain   Final    WBC PRESENT,BOTH PMN AND MONONUCLEAR NO ORGANISMS SEEN CYTOSPIN SMEAR    Culture   Final    NO GROWTH < 24 HOURS Performed at [facility], [address], [place], [place] [address]    Report Status PENDING  Incomplete     Radiology Studies: US   THORACENTESIS ASP PLEURAL SPACE W/IMG GUIDE Result Date: 09/13/2024 INDICATION: 68 year old male with stage IV lung cancer, right-sided pleural effusion. Request for diagnostic and therapeutic thoracentesis. EXAM: ULTRASOUND GUIDED RIGHT THORACENTESIS MEDICATIONS: 10 mL 1% lidocaine  COMPLICATIONS: None immediate. PROCEDURE: An ultrasound guided thoracentesis was thoroughly discussed with the patient and questions answered. The benefits, risks, alternatives and complications were also discussed. The patient understands and wishes to proceed with the procedure. Written consent was obtained. Ultrasound was performed to localize and mark an adequate pocket of fluid in the right chest. The area was then prepped and draped in the normal sterile fashion. 1% Lidocaine  was used for local anesthesia. Under ultrasound guidance a 6 Fr Safe-T-Centesis catheter was introduced. Thoracentesis was performed. The catheter was removed and a dressing applied. FINDINGS: A total of approximately 600 mL of clear, yellow fluid was removed. Samples were sent to the laboratory as requested by the clinical team. IMPRESSION: Successful ultrasound guided right thoracentesis yielding 600 mL of pleural fluid. Performed by: Kacie Matthews PA-C Electronically Signed   By: Wilkie Lent M.D.   On: 09/13/2024 12:11   DG Chest Port 1 View Result Date: 09/13/2024 EXAM: 1 VIEW(S) XRAY OF THE CHEST 09/13/2024 11:15:00 AM COMPARISON: 09/13/2024 CLINICAL HISTORY: Status post thoracentesis FINDINGS: LINES, TUBES AND DEVICES: Stable left chest port. Multiple telemetry leads overlie the chest which limits evaluation. LUNGS AND PLEURA: Small right pleural effusion. Interstitial prominence likely related to COPD. Bilateral nodular opacities better appreciated on the same day CTA chest. No pneumothorax. HEART AND MEDIASTINUM: Prominent appearance of the cardiomediastinal silhouette. BONES AND SOFT TISSUES: No acute osseous abnormality. IMPRESSION: 1.  Small right pleural effusion, slightly decreased. 2. Bilateral nodular opacities, better appreciated on the same day CTA chest. 3. Interstitial prominence, likely related to COPD. Electronically signed by: Donnice Mania MD 09/13/2024 11:49 AM EST RP Workstation: [id number]    Scheduled Meds:  amLODipine   5 mg Oral QHS   aspirin  EC  81 mg Oral Daily   atorvastatin   40 mg Oral QHS   clopidogrel   75 mg Oral Daily   enoxaparin  (LOVENOX ) injection  40 mg Subcutaneous Q24H   furosemide   20 mg Oral Daily   gabapentin   300 mg Oral TID   ipratropium-albuterol   3 mL Nebulization QID   magnesium  oxide  400 mg Oral Daily   metoprolol  tartrate  25 mg Oral BID   montelukast   10 mg Oral Daily   pantoprazole   40 mg Oral Daily   Continuous Infusions:  ceFEPime  (MAXIPIME ) IV 2 g (09/15/24 0830)   metronidazole  500 mg (09/14/24 1946)   vancomycin  Stopped (09/15/24 0836)     LOS: 2 days  MDM: Patient is high risk for one or more organ failure.  They necessitate ongoing hospitalization for continued IV therapies and subsequent lab monitoring. Total time spent interpreting labs and vitals, reviewing the medical record, coordinating care amongst consultants and care team members, directly assessing and discussing care with the patient and/or family: 55 min  Retaj Hilbun, DO [facility]  To contact the attending physician between 7A-7P please use Epic Chat. To contact the covering physician during after hours  7P-7A, please review Amion.  09/15/2024, 8:47 AM   *This document has been created with the assistance of dictation software. Please excuse typographical errors. *

## 2024-09-15 NOTE — Progress Notes (Signed)
   09/15/24 1340  Spiritual Encounters  Type of Visit Initial  Care provided to: Pt and family  Conversation partners present during encounter Nurse  Referral source Other (comment) Training And Development Officer called)  Reason for visit Religious ritual (Pt and their family requested a priest.)  Interventions  Spiritual Care Interventions Made Established relationship of care and support (Chaplain called and let message with Catholic priest @13 :47 Pt agreed I could leave his name and room number)  Intervention Outcomes  Outcomes Connection to spiritual care;Awareness of support  Spiritual Care Plan  Spiritual Care Issues Still Outstanding No further spiritual care needs at this time (see row info)

## 2024-09-15 NOTE — Care Management Important Message (Signed)
 Important Message  Patient Details  Name: Austin Patterson MRN: [id number] Date of Birth: 1956-04-29   Important Message Given:  Yes - Medicare IM     [name] 09/15/2024, 5:12 PM

## 2024-09-15 NOTE — Plan of Care (Signed)
  Problem: Education: Goal: Knowledge of General Education information will improve Description: Including pain rating scale, medication(s)/side effects and non-pharmacologic comfort measures Outcome: Progressing   Problem: Nutrition: Goal: Adequate nutrition will be maintained Outcome: Progressing   Problem: Activity: Goal: Risk for activity intolerance will decrease Outcome: Progressing   Problem: Coping: Goal: Level of anxiety will decrease Outcome: Progressing   Problem: Elimination: Goal: Will not experience complications related to bowel motility Outcome: Progressing   Problem: Pain Managment: Goal: General experience of comfort will improve and/or be controlled Outcome: Progressing   Problem: Safety: Goal: Ability to remain free from injury will improve Outcome: Progressing

## 2024-09-16 ENCOUNTER — Inpatient Hospital Stay

## 2024-09-16 DIAGNOSIS — Z515 Encounter for palliative care: Secondary | ICD-10-CM | POA: Diagnosis not present

## 2024-09-16 DIAGNOSIS — J9601 Acute respiratory failure with hypoxia: Secondary | ICD-10-CM | POA: Diagnosis not present

## 2024-09-16 DIAGNOSIS — A419 Sepsis, unspecified organism: Secondary | ICD-10-CM | POA: Diagnosis not present

## 2024-09-16 DIAGNOSIS — Z7189 Other specified counseling: Secondary | ICD-10-CM | POA: Diagnosis not present

## 2024-09-16 DIAGNOSIS — Z66 Do not resuscitate: Secondary | ICD-10-CM | POA: Diagnosis not present

## 2024-09-16 LAB — MAGNESIUM: Magnesium: 2.1 mg/dL (ref 1.7–2.4)

## 2024-09-16 LAB — COMPREHENSIVE METABOLIC PANEL WITH GFR
ALT: 35 U/L (ref 0–44)
AST: 30 U/L (ref 15–41)
Albumin: 2.9 g/dL — ABNORMAL LOW (ref 3.5–5.0)
Alkaline Phosphatase: 163 U/L — ABNORMAL HIGH (ref 38–126)
Anion gap: 7 (ref 5–15)
BUN: 20 mg/dL (ref 8–23)
CO2: 33 mmol/L — ABNORMAL HIGH (ref 22–32)
Calcium: 7.3 mg/dL — ABNORMAL LOW (ref 8.9–10.3)
Chloride: 96 mmol/L — ABNORMAL LOW (ref 98–111)
Creatinine, Ser: 0.74 mg/dL (ref 0.61–1.24)
GFR, Estimated: >60 mL/min (ref >60)
Glucose, Bld: 122 mg/dL — ABNORMAL HIGH (ref 70–99)
Potassium: 3.7 mmol/L (ref 3.5–5.1)
Sodium: 135 mmol/L (ref 135–145)
Total Bilirubin: 0.3 mg/dL (ref 0.0–1.2)
Total Protein: 5.8 g/dL — ABNORMAL LOW (ref 6.5–8.1)

## 2024-09-16 LAB — CBC WITH DIFFERENTIAL/PLATELET
Abs Immature Granulocytes: 0.22 K/uL — ABNORMAL HIGH (ref 0.00–0.07)
Basophils Absolute: 0 K/uL (ref 0.0–0.1)
Basophils Relative: 0 %
Eosinophils Absolute: 0 K/uL (ref 0.0–0.5)
Eosinophils Relative: 0 %
HCT: 30.6 % — ABNORMAL LOW (ref 39.0–52.0)
Hemoglobin: 9.3 g/dL — ABNORMAL LOW (ref 13.0–17.0)
Immature Granulocytes: 2 %
Lymphocytes Relative: 2 %
Lymphs Abs: 0.2 K/uL — ABNORMAL LOW (ref 0.7–4.0)
MCH: 28.2 pg (ref 26.0–34.0)
MCHC: 30.4 g/dL (ref 30.0–36.0)
MCV: 92.7 fL (ref 80.0–100.0)
Monocytes Absolute: 1.9 K/uL — ABNORMAL HIGH (ref 0.1–1.0)
Monocytes Relative: 13 %
Neutro Abs: 12.2 K/uL — ABNORMAL HIGH (ref 1.7–7.7)
Neutrophils Relative %: 83 %
Platelets: 196 K/uL (ref 150–400)
RBC: 3.3 MIL/uL — ABNORMAL LOW (ref 4.22–5.81)
RDW: 17.6 % — ABNORMAL HIGH (ref 11.5–15.5)
WBC: 14.7 K/uL — ABNORMAL HIGH (ref 4.0–10.5)
nRBC: 0 % (ref 0.0–0.2)

## 2024-09-16 LAB — BODY FLUID CULTURE W GRAM STAIN: Culture: NO GROWTH

## 2024-09-16 LAB — PHOSPHORUS: Phosphorus: 2.1 mg/dL — ABNORMAL LOW (ref 2.5–4.6)

## 2024-09-16 MED ORDER — MORPHINE SULFATE (PF) 2 MG/ML IV SOLN
2.0000 mg | INTRAVENOUS | Status: DC | PRN
  Administered 2024-09-16 – 2024-09-17 (×4): 2 mg via INTRAVENOUS
  Filled 2024-09-16 (×4): qty 1

## 2024-09-16 MED ORDER — METHYLPREDNISOLONE SODIUM SUCC 40 MG IJ SOLR
40.0000 mg | Freq: Two times a day (BID) | INTRAMUSCULAR | Status: DC
  Administered 2024-09-16 – 2024-09-20 (×8): 40 mg via INTRAVENOUS
  Filled 2024-09-16 (×8): qty 1

## 2024-09-16 MED ORDER — SODIUM CHLORIDE 0.9% FLUSH
10.0000 mL | Freq: Two times a day (BID) | INTRAVENOUS | Status: DC
  Administered 2024-09-16 (×2): 10 mL
  Administered 2024-09-17 (×2): 20 mL
  Administered 2024-09-18 – 2024-09-20 (×5): 10 mL

## 2024-09-16 MED ORDER — FUROSEMIDE 10 MG/ML IJ SOLN
40.0000 mg | Freq: Two times a day (BID) | INTRAMUSCULAR | Status: DC
  Administered 2024-09-16 – 2024-09-20 (×8): 40 mg via INTRAVENOUS
  Filled 2024-09-16 (×8): qty 4

## 2024-09-16 MED ORDER — SODIUM CHLORIDE 0.9% FLUSH: 10.0000 mL | INTRAVENOUS | Status: DC | PRN

## 2024-09-16 MED ORDER — HYDROCODONE-ACETAMINOPHEN 5-325 MG PO TABS
1.0000 | ORAL_TABLET | Freq: Four times a day (QID) | ORAL | Status: DC | PRN
  Administered 2024-09-16 – 2024-09-18 (×3): 1 via ORAL
  Filled 2024-09-16 (×3): qty 1

## 2024-09-16 MED ORDER — CHLORHEXIDINE GLUCONATE CLOTH 2 % EX PADS
6.0000 | MEDICATED_PAD | Freq: Every day | CUTANEOUS | Status: DC
  Administered 2024-09-16 – 2024-09-19 (×4): 6 via TOPICAL

## 2024-09-16 NOTE — Progress Notes (Signed)
 PROGRESS NOTE    Austin Patterson  FMW:[id number] DOB: 10-31-1955 DOA: 09/13/2024 PCP: Autry Grayce LABOR, PA  Chief Complaint  Patient presents with   Shortness of Breath    Hospital Course:  Austin Patterson is a 68 year old male with hypertension, hyperlipidemia, heart failure with preserved EF, stage IV lung cancer on palliative chemotherapy, asthma, COPD, paroxysmal A-fib.  He was discharged in the hospital 11/25 after being admitted with acute on chronic hypoxic respiratory failure and sepsis due to pneumonia.  He was treated with azithromycin  and Rocephin  during that hospitalization and diuresed adequately to his baseline of 3 L [place]. Patient reports he returned to the hospital as he was not getting along with the staff at his rehab facility.  Per ER documentation EMS was called due to patient dyspnea and was satting 60% on 4 L by EMS. On arrival to the ED he met sepsis criteria with tachycardia, fever, leukocytosis. In the ED he received empiric antibiotics and BiPAP for work of breathing.  He quickly weaned to 6 L. He subsequently underwent thoracentesis on 11/26 which removed 600 cc from right lung.  Follow-up CXR is much improved. Worsening respiratory status and fever again overnight on 11/28.  CXR today with worsening bilateral infiltrates.  Some worsening right-sided effusion.  Subjective: Some issues with altered mentation overnight.  Increasing O2 requirement. On evaluation patient reports he continues to feel poorly.  He is currently being visited by his friend and requests that we speak in front of him.  I was also able to update his daughter, Camie, on the phone as well.  Everyone has been updated as to the patient's tenuous respiratory status and overall prognosis.  The patient is also considering hospice now.  Objective: Vitals:   09/16/24 0434 09/16/24 0802 09/16/24 0831 09/16/24 1350  BP: 106/61 105/60 (!) 121/58   Pulse: 92 (!) 101 (!) 104 (!) 102  Resp: 20 (!) 22  (!) 22 (!) 23  Temp: 98.1 F (36.7 C)  98.7 F (37.1 C)   TempSrc:   Oral   SpO2: 100% 97% (!) 88% (!) 87%  Weight:      Height:        Intake/Output Summary (Last 24 hours) at 09/16/2024 1520 Last data filed at 09/16/2024 1408 Gross per 24 hour  Intake 1390.37 ml  Output 1200 ml  Net 190.37 ml   Filed Weights   09/13/24 0306  Weight: 80 kg    Examination: General exam: Appears calm and comfortable, NAD  Respiratory system: Shallow respirations, tachypnea, dyspneic when speaking, coarse airway sounds bilaterally, on 6 L [place]. Cardiovascular system: Rapid irregular rhythm Gastrointestinal system: Abdomen is nondistended, soft and nontender.  Neuro: Alert and oriented. No focal neurological deficits.  Assessment & Plan:  Principal Problem:   Acute respiratory failure with hypoxia (HCC) Active Problems:   Sepsis due to pneumonia (HCC)    Sepsis due to pneumonia - Sepsis criteria on arrival tachycardia, fever, leukocytosis.  Symptoms complicated by active malignancy. - Recently completed empiric course of azithromycin  and Rocephin  and was discharged to rehab facility - Given difficulty differentiating worsening pneumonia versus active malignancy we will continue with broad-spectrum cefepime , Flagyl  - Initially started on vancomycin  but MRSA PCR is negative.  Mycin discontinued 11/28 - Blood cultures remain negative - Persistent leukocytosis  Acute on chronic hypoxic respiratory failure - Multifactorial.  Acute worsening on arrival complicated picture.  Thought to be worsening pneumonia despite recently completed antibiotic course.  He was initially started on  cefepime , Flagyl , vancomycin .  Vancomycin  was discontinued on 11/28 as MRSA PCR was negative.  Further complicated by right-sided effusion requiring thoracentesis.  All of the above difficult to differentiate on plain film given lung masses and multiple bilateral nodules - Patient's respiratory status is now declining  again.  Repeat CXR today concerning for ARDS - Will increase Lasix  and initiate steroid therapy - Continue with cefepime  and Flagyl . - Patient is currently on high flow nasal cannula, will continue with this for now - BiPAP as needed - He is a DNR/DNI  Hypocalcemia Hypophosphatemia Hypomagnesemia - Electrolyte disturbances secondary to malignancy with infection - Calcium  corrects to 8.6 - Will replace all - Continue to monitor daily CMP/mag/Phos  Stage IV adenocarcinoma of the lung Active chemotherapy - Follows closely with oncology Dr. Babara - Currently receiving outpatient palliative gemcitabine  - Protective precautions ordered - Palliative care consulted, patient was followed by [facility] palliative care outpatient.  May benefit from hospice services.  Patient appears to be coming to terms with this.  I was able to discuss this with the patient, his friend, and his daughter today.  The patient is directing this decisions to his son.  His son has been unavailable for phone calls   Right-sided pleural effusion - In the setting of active malignancy and CHF. - 11/26 600 cc removed via thoracentesis. F/u CXR improved - 9/3 700 cc also removed -Evidence of worsening effusion today on CXR. - Echocardiogram September: LVEF 55 to 60%, grade 1 diastolic dysfunction  Chronic anemia - Stable  GERD - Continue PPI  Hyperlipidemia - Statin  Paroxysmal A-fib, with RVR - Has been on metoprolol  succinate, not on anticoagulation at home. - RVR into the 120s - Continue daily medication titration.  Doing better on current dose of metoprolol  to tartrate.  RVR certainly triggered by persistent hypoxia and hypercapnia as well as active infection.  Hypertension - Resume home meds gradually, titrate as needed  Body mass index is 27.62 kg/m. Overweight - Outpatient follow up for lifestyle modification and risk factor management  Poor prognosis - Given patient's malignancy, chemotherapy,  COPD, and declining respiratory status, he has been unable to successfully discharge from the hospital and maintain outpatient therapies.  He is an excellent hospice candidate.  Palliative care is consulted to assist in GOC conversations.  At this time the patient, his friends, his children are discussing care plan.  DVT prophylaxis: lovenox    Code Status: Limited: Do not attempt resuscitation (DNR) -DNR-LIMITED -Do Not Intubate/DNI  Disposition:  Inpatient clinical resolution, eventually return to SNF with palliative care unless he decides to pursue hospice during this admission.  Consultants:    Procedures:    Antimicrobials:  Anti-infectives (From admission, onward)    Start     Dose/Rate Route Frequency Ordered Stop   09/14/24 0600  vancomycin  (VANCOREADY) IVPB 1750 mg/350 mL  Status:  Discontinued        1,750 mg 175 mL/hr over 120 Minutes Intravenous Every 24 hours 09/13/24 0745 09/15/24 1440   09/13/24 0800  metroNIDAZOLE  (FLAGYL ) IVPB 500 mg        500 mg 100 mL/hr over 60 Minutes Intravenous Every 12 hours 09/13/24 0732     09/13/24 0800  ceFEPIme  (MAXIPIME ) 2 g in sodium chloride  0.9 % 100 mL IVPB        2 g 200 mL/hr over 30 Minutes Intravenous Every 8 hours 09/13/24 0745     09/13/24 0315  aztreonam  (AZACTAM ) 2 g in sodium chloride  0.9 %  100 mL IVPB        2 g 200 mL/hr over 30 Minutes Intravenous  Once 09/13/24 0305 09/13/24 0405   09/13/24 0315  metroNIDAZOLE  (FLAGYL ) IVPB 500 mg        500 mg 100 mL/hr over 60 Minutes Intravenous  Once 09/13/24 0305 09/13/24 0458   09/13/24 0315  vancomycin  (VANCOCIN ) IVPB 1000 mg/200 mL premix  Status:  Discontinued        1,000 mg 200 mL/hr over 60 Minutes Intravenous  Once 09/13/24 0305 09/13/24 0307   09/13/24 0315  vancomycin  (VANCOREADY) IVPB 1750 mg/350 mL        1,750 mg 175 mL/hr over 120 Minutes Intravenous  Once 09/13/24 0307 09/13/24 0816       Data Reviewed: I have personally reviewed following labs and imaging  studies CBC: Recent Labs  Lab 09/12/24 0421 09/13/24 0320 09/14/24 0116 09/14/24 0640 09/14/24 1310 09/15/24 0431 09/16/24 0454  WBC 6.7 11.4*  --  11.4*  --  13.7* 14.7*  NEUTROABS  --  8.4*  --   --   --  11.2* 12.2*  HGB 9.8* 11.2* 9.3* 9.6* 9.3* 8.9* 9.3*  HCT 32.4* 36.5* 29.4* 30.6* 30.1* 28.5* 30.6*  MCV 93.4 90.6  --  90.8  --  90.5 92.7  PLT 185 186  --  137*  --  149* 196   Basic Metabolic Panel: Recent Labs  Lab 09/12/24 0421 09/13/24 0320 09/14/24 0640 09/15/24 0431 09/16/24 0454  NA 138 137 138 136 135  K 3.8 3.9 3.8 3.6 3.7  CL 95* 94* 98 97* 96*  CO2 35* 33* 32 30 33*  GLUCOSE 101* 159* 128* 125* 122*  BUN 25* 29* 21 21 20   CREATININE 0.93 0.96 0.88 0.82 0.74  CALCIUM  8.5* 9.0 7.8* 7.6* 7.3*  MG  --   --   --  1.4* 2.1  PHOS  --   --   --  2.3* 2.1*   GFR: Estimated Creatinine Clearance: 89.6 mL/min (by C-G formula based on SCr of 0.74 mg/dL). Liver Function Tests: Recent Labs  Lab 09/13/24 0320 09/15/24 0431 09/16/24 0454  AST 68* 33 30  ALT 92* 39 35  ALKPHOS 208* 165* 163*  BILITOT 0.5 0.4 0.3  PROT 7.3 5.9* 5.8*  ALBUMIN 3.8 2.8* 2.9*   CBG: Recent Labs  Lab 09/10/24 0840 09/12/24 0821 09/13/24 0311  GLUCAP 86 95 152*    Recent Results (from the past 240 hours)  Resp panel by RT-PCR (RSV, Flu A&B, Covid) Anterior Nasal Swab     Status: None   Collection Time: 09/13/24  3:20 AM   Specimen: Anterior Nasal Swab  Result Value Ref Range Status   SARS Coronavirus 2 by RT PCR NEGATIVE NEGATIVE Final    Comment: (NOTE) SARS-CoV-2 target nucleic acids are NOT DETECTED.  The SARS-CoV-2 RNA is generally detectable in upper respiratory specimens during the acute phase of infection. The lowest concentration of SARS-CoV-2 viral copies this assay can detect is 138 copies/mL. A negative result does not preclude SARS-Cov-2 infection and should not be used as the sole basis for treatment or other patient management decisions. A negative  result may occur with  improper specimen collection/handling, submission of specimen other than nasopharyngeal swab, presence of viral mutation(s) within the areas targeted by this assay, and inadequate number of viral copies(<138 copies/mL). A negative result must be combined with clinical observations, patient history, and epidemiological information. The expected result is Negative.  Fact Sheet for Patients:  bloggercourse.com  Fact Sheet for Healthcare Providers:  seriousbroker.it  This test is no t yet approved or cleared by the [place]  FDA and  has been authorized for detection and/or diagnosis of SARS-CoV-2 by FDA under an Emergency Use Authorization (EUA). This EUA will remain  in effect (meaning this test can be used) for the duration of the COVID-19 declaration under Section 564(b)(1) of the Act, 21 U.S.C.section 360bbb-3(b)(1), unless the authorization is terminated  or revoked sooner.       Influenza A by PCR NEGATIVE NEGATIVE Final   Influenza B by PCR NEGATIVE NEGATIVE Final    Comment: (NOTE) The Xpert Xpress SARS-CoV-2/FLU/RSV plus assay is intended as an aid in the diagnosis of influenza from Nasopharyngeal swab specimens and should not be used as a sole basis for treatment. Nasal washings and aspirates are unacceptable for Xpert Xpress SARS-CoV-2/FLU/RSV testing.  Fact Sheet for Patients: bloggercourse.com  Fact Sheet for Healthcare Providers: seriousbroker.it  This test is not yet approved or cleared by the [place]  FDA and has been authorized for detection and/or diagnosis of SARS-CoV-2 by FDA under an Emergency Use Authorization (EUA). This EUA will remain in effect (meaning this test can be used) for the duration of the COVID-19 declaration under Section 564(b)(1) of the Act, 21 U.S.C. section 360bbb-3(b)(1), unless the authorization is  terminated or revoked.     Resp Syncytial Virus by PCR NEGATIVE NEGATIVE Final    Comment: (NOTE) Fact Sheet for Patients: bloggercourse.com  Fact Sheet for Healthcare Providers: seriousbroker.it  This test is not yet approved or cleared by the [place]  FDA and has been authorized for detection and/or diagnosis of SARS-CoV-2 by FDA under an Emergency Use Authorization (EUA). This EUA will remain in effect (meaning this test can be used) for the duration of the COVID-19 declaration under Section 564(b)(1) of the Act, 21 U.S.C. section 360bbb-3(b)(1), unless the authorization is terminated or revoked.  Performed at [facility], [address] [place], [place] [address]   Blood Culture (routine x 2)     Status: None (Preliminary result)   Collection Time: 09/13/24  3:20 AM   Specimen: BLOOD  Result Value Ref Range Status   Specimen Description BLOOD BLOOD RIGHT FOREARM  Final   Special Requests   Final    BOTTLES DRAWN AEROBIC AND ANAEROBIC Blood Culture adequate volume   Culture   Final    NO GROWTH 3 DAYS Performed at [facility], [address], [place], [place] [address]    Report Status PENDING  Incomplete  Blood Culture (routine x 2)     Status: None (Preliminary result)   Collection Time: 09/13/24  3:20 AM   Specimen: BLOOD  Result Value Ref Range Status   Specimen Description BLOOD BLOOD LEFT FOREARM  Final   Special Requests   Final    BOTTLES DRAWN AEROBIC AND ANAEROBIC Blood Culture adequate volume   Culture   Final    NO GROWTH 3 DAYS Performed at [facility], [address] [place], [place] [address]    Report Status PENDING  Incomplete  Body fluid culture w Gram Stain     Status: None (Preliminary result)   Collection Time: 09/13/24 10:46 AM   Specimen: PATH Cytology Pleural fluid  Result Value Ref Range Status   Specimen Description   Final    PLEURAL Performed at  [facility], [address], [place], [place] [address]    Special Requests   Final  NONE Performed at [facility], [address] [place], [place] [address]    Gram Stain   Final    WBC PRESENT,BOTH PMN AND MONONUCLEAR NO ORGANISMS SEEN CYTOSPIN SMEAR    Culture   Final    NO GROWTH 3 DAYS Performed at [facility], [address] [place], [place] [address]    Report Status PENDING  Incomplete  MRSA Next Gen by PCR, Nasal     Status: None   Collection Time: 09/15/24 12:00 PM   Specimen: Nasal Mucosa; Nasal Swab  Result Value Ref Range Status   MRSA by PCR Next Gen NOT DETECTED NOT DETECTED Final    Comment: (NOTE) The GeneXpert MRSA Assay (FDA approved for NASAL specimens only), is one component of a comprehensive MRSA colonization surveillance program. It is not intended to diagnose MRSA infection nor to guide or monitor treatment for MRSA infections. Test performance is not FDA approved in patients less than 59 years old. Performed at [facility], [address] [place], [place] [address]      Radiology Studies: No results found.   Scheduled Meds:  amLODipine   5 mg Oral QHS   aspirin  EC  81 mg Oral Daily   atorvastatin   40 mg Oral QHS   calcium -vitamin D   1 tablet Oral BID   Chlorhexidine  Gluconate Cloth  6 each Topical Daily   clopidogrel   75 mg Oral Daily   enoxaparin  (LOVENOX ) injection  40 mg Subcutaneous Q24H   furosemide   20 mg Oral Daily   gabapentin   300 mg Oral TID   ipratropium-albuterol   3 mL Nebulization Q6H   lactulose   10 g Oral TID   magnesium  oxide  400 mg Oral Daily   metoprolol  tartrate  50 mg Oral BID   montelukast   10 mg Oral Daily   pantoprazole   40 mg Oral Daily   sodium chloride  flush  10-40 mL Intracatheter Q12H   Continuous Infusions:  ceFEPime  (MAXIPIME ) IV 2 g (09/16/24 1031)   metronidazole  500 mg (09/16/24 1249)     LOS: 3 days  MDM: Patient is high risk for one or more organ  failure.  They necessitate ongoing hospitalization for continued IV therapies and subsequent lab monitoring. Total time spent interpreting labs and vitals, reviewing the medical record, coordinating care amongst consultants and care team members, directly assessing and discussing care with the patient and/or family: 55 min  Raoul Ciano, DO [facility]  To contact the attending physician between 7A-7P please use Epic Chat. To contact the covering physician during after hours 7P-7A, please review Amion.  09/16/2024, 3:20 PM   *This document has been created with the assistance of dictation software. Please excuse typographical errors. *

## 2024-09-16 NOTE — Progress Notes (Signed)
 Condom cath applied medium-size d/t Primafit leaking.

## 2024-09-16 NOTE — Plan of Care (Signed)
 Pt is alert oriented x 4. Pt is on 10L high flow nasal cannula. Pt O2 demand increases with exertion but recovers once resting. Pt continues to be yellow mews for RR, HR. Q4 vitals continued per order. Pt repositioned q2. Purewick in place. Pt had full bath, face shaved per request, full linen change, and mouth care. Pt resting with eyes closed, no distress noted.    Problem: Education: Goal: Knowledge of General Education information will improve Description: Including pain rating scale, medication(s)/side effects and non-pharmacologic comfort measures Outcome: Progressing   Problem: Health Behavior/Discharge Planning: Goal: Ability to manage health-related needs will improve Outcome: Progressing   Problem: Clinical Measurements: Goal: Ability to maintain clinical measurements within normal limits will improve Outcome: Progressing Goal: Will remain free from infection Outcome: Progressing Goal: Diagnostic test results will improve Outcome: Progressing Goal: Respiratory complications will improve Outcome: Progressing Goal: Cardiovascular complication will be avoided Outcome: Progressing   Problem: Activity: Goal: Risk for activity intolerance will decrease Outcome: Progressing   Problem: Nutrition: Goal: Adequate nutrition will be maintained Outcome: Progressing   Problem: Coping: Goal: Level of anxiety will decrease Outcome: Progressing   Problem: Elimination: Goal: Will not experience complications related to bowel motility Outcome: Progressing Goal: Will not experience complications related to urinary retention Outcome: Progressing   Problem: Pain Managment: Goal: General experience of comfort will improve and/or be controlled Outcome: Progressing   Problem: Safety: Goal: Ability to remain free from injury will improve Outcome: Progressing   Problem: Skin Integrity: Goal: Risk for impaired skin integrity will decrease Outcome: Progressing

## 2024-09-16 NOTE — Progress Notes (Signed)
 Mitts off as the pt is more oriented now. His friend is bedside visiting with him.

## 2024-09-16 NOTE — Progress Notes (Signed)
 Palliative Care Progress Note, Assessment & Plan   Patient Name: Austin Patterson       Date: 09/16/2024 DOB: 11-21-1955  Age: 68 y.o. MRN#: [id number] Attending Physician: Leesa Kast, DO Primary Care Physician: Autry Grayce LABOR, PA Admit Date: 09/13/2024  Subjective: Complains of 9/10 chest pain. Endorses SOB. Eating some but stopping when he gets tired. Slept well.   HPI: Per previous HPI- 68 y.o. male with medical history significant for hypertension, hyperlipidemia, heart failure with preserved EF, stage IV lung cancer on palliative chemotherapy, asthma, COPD and paroxysmal A-fib just discharged on 11/25 being admitted to the hospital with recurrent acute on chronic hypoxic respiratory failure and sepsis likely due to continued pneumonia.  He was treated during this last hospitalization with empiric IV azithromycin  and Rocephin , completed a course.  He was diuresed due to his heart failure, and was noted to be euvolemic on his baseline 3 to 4 L nasal cannula oxygen  upon discharge.  Per the patient, he returned back to the hospital because he was not getting along with the staff, who he says had an attitude.  When asked, he denies any cough, shortness of breath or any significant changes.  However per ER documentation, EMS was called from his rehab facility due to patient being very short of breath without relief despite hourly breathing treatments, he was noted to be saturating 60% on 4 L nasal cannula oxygen  by EMS.  In the emergency department, patient received empiric IV antibiotics, was placed on BiPAP for work of breathing and is now significantly improved with normal saturations on 6 L nasal cannula.   Summary of counseling/coordination of care: Extensive chart review completed prior to meeting  patient including labs, vital signs, imaging, progress notes, orders, and available advanced directive documents from current and previous encounters.      Latest Ref Rng & Units 09/16/2024    4:54 AM 09/15/2024    4:31 AM 09/14/2024    1:10 PM  CBC  WBC 4.0 - 10.5 K/uL 14.7  13.7    Hemoglobin 13.0 - 17.0 g/dL 9.3  8.9  9.3   Hematocrit 39.0 - 52.0 % 30.6  28.5  30.1   Platelets 150 - 400 K/uL 196  149        Latest Ref Rng & Units 09/16/2024    4:54 AM 09/15/2024    4:31 AM 09/14/2024    6:40 AM  CMP  Glucose 70 - 99 mg/dL 877  874  871   BUN 8 - 23 mg/dL 20  21  21    Creatinine 0.61 - 1.24 mg/dL 9.25  9.17  9.11   Sodium 135 - 145 mmol/L 135  136  138   Potassium 3.5 - 5.1 mmol/L 3.7  3.6  3.8   Chloride 98 - 111 mmol/L 96  97  98   CO2 22 - 32 mmol/L 33  30  32   Calcium  8.9 - 10.3 mg/dL 7.3  7.6  7.8   Total Protein 6.5 - 8.1 g/dL 5.8  5.9    Total Bilirubin 0.0 - 1.2 mg/dL 0.3  0.4    Alkaline Phos 38 - 126 U/L 163  165  AST 15 - 41 U/L 30  33    ALT 0 - 44 U/L 35  39     After reviewing the patient's chart and assessing the patient at bedside, I spoke with patient in regards to symptom management and goals of care.   Ill-appearing, elderly male sitting upright with nursing staff and Austin Patterson (friend) at bedside. He is alert and able to participate in goals of care conversations. Noted increase WOB with accessory muscle use. He is alert to self, time and location but exhibits intermittent confusion stating he saw military planes was was sleeping on a bed roll.   Discussed with Mr. Carsten that he has been through a lot with his health. He understands that his cancer is not curable but does not respond when discussing comfort or hospice care. He ask that I call his son and tell him he is in the hospital.   Austin Patterson via phone per patient request. Patient son shares that he is aware that his father is hospitalized. He understands that his father's  condition is guarded due to poor respiratory status. Advised that his father may be nearing end of life with his increased WOB. Austin Patterson states that he was planning to visit during Christmas break but he may need to come sooner. Austin Patterson shares that all medical information should be shared with his father's friends at the bedside as he is in contact with them.   Therapeutic silence and active listening provided for patient's son to share his thoughts and emotions regarding current medical situation.  Emotional support provided.  Physical Exam Vitals reviewed.  Constitutional:      General: He is not in acute distress.    Appearance: He is ill-appearing.  HENT:     Mouth/Throat:     Mouth: Mucous membranes are moist.  Pulmonary:     Comments: Increased WOB. Unable to speak in complete sentences Air hunger with conversation Skin:    General: Skin is warm and dry.  Neurological:     Mental Status: He is alert.     Comments: Oriented with intermittent confusion    Recommendations/Plan: DNR/DNI Continue current supportive interventions Disposition TBD PMT will continue to follow         Total Time 65 minutes   Discussed plan of care and medication management with Dr. Dezii and Zada, RN.   Time spent includes: Detailed review of medical records (labs, imaging, vital signs), medically appropriate exam (mental status, respiratory, cardiac, skin), discussed with treatment team, counseling and educating patient, family and staff, documenting clinical information, medication management and coordination of care.     Devere Sacks, AMAN[facility], [place] Palliative Medicine Team  09/16/2024 9:55 AM  Office [phone]  Pager [phone]

## 2024-09-17 DIAGNOSIS — R4182 Altered mental status, unspecified: Secondary | ICD-10-CM

## 2024-09-17 DIAGNOSIS — Z515 Encounter for palliative care: Secondary | ICD-10-CM | POA: Diagnosis not present

## 2024-09-17 DIAGNOSIS — Z789 Other specified health status: Secondary | ICD-10-CM

## 2024-09-17 DIAGNOSIS — J9621 Acute and chronic respiratory failure with hypoxia: Secondary | ICD-10-CM

## 2024-09-17 DIAGNOSIS — Z66 Do not resuscitate: Secondary | ICD-10-CM | POA: Diagnosis not present

## 2024-09-17 DIAGNOSIS — Z7189 Other specified counseling: Secondary | ICD-10-CM | POA: Diagnosis not present

## 2024-09-17 DIAGNOSIS — A419 Sepsis, unspecified organism: Secondary | ICD-10-CM

## 2024-09-17 DIAGNOSIS — J9601 Acute respiratory failure with hypoxia: Secondary | ICD-10-CM | POA: Diagnosis not present

## 2024-09-17 DIAGNOSIS — J189 Pneumonia, unspecified organism: Secondary | ICD-10-CM

## 2024-09-17 LAB — COMPREHENSIVE METABOLIC PANEL WITH GFR
ALT: 32 U/L (ref 0–44)
AST: 43 U/L — ABNORMAL HIGH (ref 15–41)
Albumin: 3 g/dL — ABNORMAL LOW (ref 3.5–5.0)
Alkaline Phosphatase: 177 U/L — ABNORMAL HIGH (ref 38–126)
Anion gap: 10 (ref 5–15)
BUN: 22 mg/dL (ref 8–23)
CO2: 32 mmol/L (ref 22–32)
Calcium: 7.2 mg/dL — ABNORMAL LOW (ref 8.9–10.3)
Chloride: 95 mmol/L — ABNORMAL LOW (ref 98–111)
Creatinine, Ser: 0.8 mg/dL (ref 0.61–1.24)
GFR, Estimated: >60 mL/min (ref >60)
Glucose, Bld: 157 mg/dL — ABNORMAL HIGH (ref 70–99)
Potassium: 5 mmol/L (ref 3.5–5.1)
Sodium: 137 mmol/L (ref 135–145)
Total Bilirubin: 0.4 mg/dL (ref 0.0–1.2)
Total Protein: 6.4 g/dL — ABNORMAL LOW (ref 6.5–8.1)

## 2024-09-17 LAB — CBC WITH DIFFERENTIAL/PLATELET
Abs Immature Granulocytes: 0.13 K/uL — ABNORMAL HIGH (ref 0.00–0.07)
Basophils Absolute: 0 K/uL (ref 0.0–0.1)
Basophils Relative: 0 %
Eosinophils Absolute: 0 K/uL (ref 0.0–0.5)
Eosinophils Relative: 0 %
HCT: 32 % — ABNORMAL LOW (ref 39.0–52.0)
Hemoglobin: 9.5 g/dL — ABNORMAL LOW (ref 13.0–17.0)
Immature Granulocytes: 1 %
Lymphocytes Relative: 1 %
Lymphs Abs: 0.1 K/uL — ABNORMAL LOW (ref 0.7–4.0)
MCH: 28.1 pg (ref 26.0–34.0)
MCHC: 29.7 g/dL — ABNORMAL LOW (ref 30.0–36.0)
MCV: 94.7 fL (ref 80.0–100.0)
Monocytes Absolute: 0.8 K/uL (ref 0.1–1.0)
Monocytes Relative: 7 %
Neutro Abs: 10.8 K/uL — ABNORMAL HIGH (ref 1.7–7.7)
Neutrophils Relative %: 91 %
Platelets: 284 K/uL (ref 150–400)
RBC: 3.38 MIL/uL — ABNORMAL LOW (ref 4.22–5.81)
RDW: 17.4 % — ABNORMAL HIGH (ref 11.5–15.5)
WBC: 11.9 K/uL — ABNORMAL HIGH (ref 4.0–10.5)
nRBC: 0 % (ref 0.0–0.2)

## 2024-09-17 LAB — MAGNESIUM: Magnesium: 1.9 mg/dL (ref 1.7–2.4)

## 2024-09-17 LAB — PHOSPHORUS: Phosphorus: 2.3 mg/dL — ABNORMAL LOW (ref 2.5–4.6)

## 2024-09-17 MED ORDER — LORAZEPAM 2 MG/ML IJ SOLN
1.0000 mg | INTRAMUSCULAR | Status: DC | PRN
  Administered 2024-09-17 (×2): 1 mg via INTRAVENOUS
  Filled 2024-09-17 (×2): qty 1

## 2024-09-17 MED ORDER — HALOPERIDOL LACTATE 2 MG/ML PO CONC: 0.5000 mg | ORAL | Status: DC | PRN

## 2024-09-17 MED ORDER — HALOPERIDOL 0.5 MG PO TABS: 0.5000 mg | ORAL_TABLET | ORAL | Status: DC | PRN

## 2024-09-17 MED ORDER — HALOPERIDOL LACTATE 5 MG/ML IJ SOLN
0.5000 mg | INTRAMUSCULAR | Status: DC | PRN
  Administered 2024-09-17: 0.5 mg via INTRAVENOUS
  Filled 2024-09-17: qty 1

## 2024-09-17 MED ORDER — LORAZEPAM 1 MG PO TABS
1.0000 mg | ORAL_TABLET | ORAL | Status: DC | PRN
  Administered 2024-09-17 – 2024-09-18 (×2): 1 mg via ORAL
  Filled 2024-09-17 (×2): qty 1

## 2024-09-17 MED ORDER — MORPHINE SULFATE (PF) 2 MG/ML IV SOLN
2.0000 mg | INTRAVENOUS | Status: DC
  Administered 2024-09-17 – 2024-09-20 (×18): 2 mg via INTRAVENOUS
  Filled 2024-09-17 (×20): qty 1

## 2024-09-17 MED ORDER — LORAZEPAM 2 MG/ML PO CONC: 1.0000 mg | ORAL | Status: DC | PRN

## 2024-09-17 NOTE — Progress Notes (Signed)
 Palliative Care Progress Note, Assessment & Plan   Patient Name: Austin Patterson       Date: 09/17/2024 DOB: Mar 23, 1956  Age: 68 y.o. MRN#: [id number] Attending Physician: Dezii, Alexandra, DO Primary Care Physician: Autry Grayce LABOR, PA Admit Date: 09/13/2024  Subjective: Not feeling well. Endorses continuing chest pain and SOB that is relieved with morphine . Eating small amounts.   HPI: Per previous HPI- 68 y.o. male with medical history significant for hypertension, hyperlipidemia, heart failure with preserved EF, stage IV lung cancer on palliative chemotherapy, asthma, COPD and paroxysmal A-fib just discharged on 11/25 being admitted to the hospital with recurrent acute on chronic hypoxic respiratory failure and sepsis likely due to continued pneumonia.  He was treated during this last hospitalization with empiric IV azithromycin  and Rocephin , completed a course.  He was diuresed due to his heart failure, and was noted to be euvolemic on his baseline 3 to 4 L nasal cannula oxygen  upon discharge.  Per the patient, he returned back to the hospital because he was not getting along with the staff, who he says had an attitude.  When asked, he denies any cough, shortness of breath or any significant changes.  However per ER documentation, EMS was called from his rehab facility due to patient being very short of breath without relief despite hourly breathing treatments, he was noted to be saturating 60% on 4 L nasal cannula oxygen  by EMS.  In the emergency department, patient received empiric IV antibiotics, was placed on BiPAP for work of breathing and is now significantly improved with normal saturations on 6 L nasal cannula.    Palliative care team consulted to assist with goals of care conversations.   Summary  of counseling/coordination of care: Extensive chart review completed prior to meeting patient including labs, vital signs, imaging, progress notes, orders, and available advanced directive documents from current and previous encounters.   After reviewing the patient's chart and assessing the patient at bedside, I spoke with patient in regards to symptom management and goals of care.   Ill-appearing, elderly male resting in bed. Friend at bedside. He is A&O x 4 today. He is able to participate in goals of care conversations. Vickye shares that he is getting tired and leaning toward hospice care.   Dr. Leesa arrives at bedside and continues hospice conversation. He verbalizes understanding that he has not responded to aggressive treatment. Vickye shares that he wants to see his children. Pt advised that staff will contact family and notify that he is transitioning to comfort care. Ubaldo Banter, friend at bedside, shares that he will attempt to contact patients children as well.   Therapeutic silence and active listening provided for patient to share his thoughts and emotions regarding current medical situation.  Emotional support provided.  Dr. Leesa notifies later that patient's children are planning visit this week from [place] .   Physical Exam Vitals reviewed.  Constitutional:      General: He is not in acute distress.    Appearance: He is ill-appearing.  HENT:     Head: Normocephalic and atraumatic.  Pulmonary:     Effort: No respiratory distress.     Comments: Conversational dyspnea  Musculoskeletal:  Right lower leg: No edema.     Left lower leg: No edema.  Skin:    General: Skin is warm and dry.  Neurological:     Mental Status: He is alert and oriented to person, place, and time.  Psychiatric:        Mood and Affect: Mood normal.        Behavior: Behavior normal.        Thought Content: Thought content normal.        Judgment: Judgment normal.   Recommendations/Plan:          Focus of care is comfort and dignity allowing for natural death Utilize ordered medications to maintain comfort Anticipate hospital death         Total Time 50 minutes   Time spent includes: Detailed review of medical records (labs, imaging, vital signs), medically appropriate exam (mental status, respiratory, cardiac, skin), discussed with treatment team, counseling and educating patient, family and staff, documenting clinical information, medication management and coordination of care.     Devere Sacks, AMANDA [facility] Palliative Medicine Team  09/17/2024 9:34 AM  Office [phone]  Pager [phone]

## 2024-09-17 NOTE — Progress Notes (Signed)
 PROGRESS NOTE    Austin Patterson  FMW:[id number] DOB: [birth year] DOA: 09/13/2024 PCP: Autry Grayce LABOR, PA  Chief Complaint  Patient presents with   Shortness of Breath    Hospital Course:  Austin Patterson is a 68 year old male with hypertension, hyperlipidemia, heart failure with preserved EF, stage IV lung cancer on palliative chemotherapy, asthma, COPD, paroxysmal A-fib.  He was discharged in the hospital 11/25 after being admitted with acute on chronic hypoxic respiratory failure and sepsis due to pneumonia.  He was treated with azithromycin  and Rocephin  during that hospitalization and diuresed adequately to his baseline of 3 L [place]. Patient reports he returned to the hospital as he was not getting along with the staff at his rehab facility.  Per ER documentation EMS was called due to patient dyspnea and was satting 60% on 4 L by EMS. On arrival to the ED he met sepsis criteria with tachycardia, fever, leukocytosis. In the ED he received empiric antibiotics and BiPAP for work of breathing.  He quickly weaned to 6 L. He subsequently underwent thoracentesis on 11/26 which removed 600 cc from right lung.  Follow-up CXR is much improved. Worsening respiratory status and fever again overnight on 11/28.  CXR 11/28 with worsening infiltrates with worsening right-sided effusion. After extensive discussions between myself, palliative care team, the patient and his family and friends he ultimately decided to proceed with comfort measures only and pursue hospice care on 11/30  Subjective: This morning the patient is very tired, he reports he has recently received some morphine  and that has helped significantly.  We had frank discussion regarding his prognosis and poor improvement despite aggressive therapies including steroids, diuretics, antibiotics.  Palliative care NP, Devere, at bedside for discussion. We discussed directly with the patient that at this time he is in charge of his care.  We  discussed that he may soon be unable to make decisions for himself and we would like to know his wishes at this time.  Today reports that he is ready to make the transition into hospice care.  He would like to focus on being out of pain.  His friend, Austin Patterson, at bedside for our discussion is in agreement.  The patient asked that I call his son, Austin Patterson.  I talked to Austin Patterson on the phone and let him know about the transition to hospice.  Austin Patterson is going to try and come to town as soon as they are able.  Austin Patterson also reports he will update his sister Austin Patterson.  Objective: Vitals:   09/17/24 0715 09/17/24 0759 09/17/24 0839 09/17/24 1200  BP:  126/72 124/80 120/71  Pulse:   99 87  Resp:  18 17 19   Temp:   97.9 F (36.6 C)   TempSrc:   Axillary   SpO2: 98%  (!) 79% 96%  Weight:      Height:        Intake/Output Summary (Last 24 hours) at 09/17/2024 1307 Last data filed at 09/17/2024 1033 Gross per 24 hour  Intake 240 ml  Output 1200 ml  Net -960 ml   Filed Weights   09/13/24 0306  Weight: 80 kg    Examination: General exam: Appears calm and comfortable, NAD  Respiratory system: Shallow respirations, tachypnea, dyspneic when speaking, coarse airway sounds bilaterally, on 6 L [place]. Cardiovascular system: Rapid irregular rhythm Gastrointestinal system: Abdomen is nondistended, soft and nontender.  Neuro: Alert and oriented. No focal neurological deficits.  Assessment & Plan:  Principal Problem:   Acute  respiratory failure with hypoxia (HCC) Active Problems:   Sepsis due to pneumonia (HCC)    Comfort measures only - We are making the transition to comfort measures only this morning.  Patient has advanced stage IV lung cancer on palliative chemotherapy as well as COPD, A-fib RVR, acute on chronic hypoxic respiratory failure and presented on this admission septic. - Palliative care has been involved in our discussions - Patient has requested that we continue to update his children and his friends  Austin Patterson and Marcey. - We will continue with IV diuresis and steroid therapy as I do believe they are providing symptomatic support.  Will discontinue DAPT, statin, and antibiotics as these are providing little utility at this time. - Turn off all alarms - qshift vitals - Unlimited visitor status - Discontinue all labs.  Proceed only with medications that provide immediate comfort. - As needed Haldol, Ativan , morphine  remain available.  Morphine  was scheduled today due to consistent patient need.  May require transition to drip at a later time. - For now we will keep patient in house.  I believe his respiratory status is too tenuous to attempt transfer to hospice home.  If patient does well over the next 48 hours can consider consultation with inpatient hospice.  Sepsis due to pneumonia - Sepsis criteria on arrival tachycardia, fever, leukocytosis.  Symptoms complicated by active malignancy. - Recently completed empiric course of azithromycin  and Rocephin  and was discharged to rehab facility - Given difficulty differentiating worsening pneumonia versus active malignancy he was started on broad-spectrum antibiotics on arrival.  Fortunately this had minimal impact on his respiratory status.  They were discontinued 11/30  - Blood cultures remain negative - Persistent leukocytosis  Acute on chronic hypoxic respiratory failure - Multifactorial.  Acute worsening on arrival complicated picture.  Thought to be worsening pneumonia despite recently completed antibiotic course.  He was initially started on cefepime , Flagyl , vancomycin .  Vancomycin  was discontinued on 11/28 as MRSA PCR was negative.  Further complicated by right-sided effusion requiring thoracentesis.  All of the above difficult to differentiate on plain film given lung masses and multiple bilateral nodules - Patient's respiratory status is now declining again.  Repeat CXR 11/29 concerning for ARDS - Continue with Lasix  and steroids as they do  appear to be providing at least mild symptomatic relief - Patient is currently on high flow nasal cannula, will continue with this for now.  Likely transition to 2 L [place] soon - He is a DNR/DNI  Hypocalcemia Hypophosphatemia Hypomagnesemia -Have replaced.  Discontinue lab trending  Stage IV adenocarcinoma of the lung Active chemotherapy - Follows closely with oncology Dr. Babara - Currently receiving outpatient palliative gemcitabine  - Protective precautions ordered - Proceed with comfort measures only as above.  Right-sided pleural effusion - In the setting of active malignancy and CHF. - 11/26 600 cc removed via thoracentesis. F/u CXR improved - 9/3 700 cc also removed -Evidence of worsening effusion on CXR 11/29 - Echocardiogram September: LVEF 55 to 60%, grade 1 diastolic dysfunction  Chronic anemia - Stable  GERD - Continue PPI  Hyperlipidemia - Statin  Paroxysmal A-fib, with RVR - Has been on metoprolol  succinate, not on anticoagulation at home. - RVR into the 120s - Continue metoprolol  tartrate now  Hypertension - Resume home meds gradually, titrate as needed  Body mass index is 27.62 kg/m. Overweight - Outpatient follow up for lifestyle modification and risk factor management  DVT prophylaxis: lovenox    Code Status: Do not attempt resuscitation (DNR) -  Comfort care Disposition: Comfort measures only.  Anticipate in-hospital death.  If patient stabilizes may consider inpatient hospice Consultants:    Procedures:    Antimicrobials:  Anti-infectives (From admission, onward)    Start     Dose/Rate Route Frequency Ordered Stop   09/14/24 0600  vancomycin  (VANCOREADY) IVPB 1750 mg/350 mL  Status:  Discontinued        1,750 mg 175 mL/hr over 120 Minutes Intravenous Every 24 hours 09/13/24 0745 09/15/24 1440   09/13/24 0800  metroNIDAZOLE  (FLAGYL ) IVPB 500 mg  Status:  Discontinued        500 mg 100 mL/hr over 60 Minutes Intravenous Every 12 hours 09/13/24  0732 09/17/24 1306   09/13/24 0800  ceFEPIme  (MAXIPIME ) 2 g in sodium chloride  0.9 % 100 mL IVPB  Status:  Discontinued        2 g 200 mL/hr over 30 Minutes Intravenous Every 8 hours 09/13/24 0745 09/17/24 1306   09/13/24 0315  aztreonam  (AZACTAM ) 2 g in sodium chloride  0.9 % 100 mL IVPB        2 g 200 mL/hr over 30 Minutes Intravenous  Once 09/13/24 0305 09/13/24 0405   09/13/24 0315  metroNIDAZOLE  (FLAGYL ) IVPB 500 mg        500 mg 100 mL/hr over 60 Minutes Intravenous  Once 09/13/24 0305 09/13/24 0458   09/13/24 0315  vancomycin  (VANCOCIN ) IVPB 1000 mg/200 mL premix  Status:  Discontinued        1,000 mg 200 mL/hr over 60 Minutes Intravenous  Once 09/13/24 0305 09/13/24 0307   09/13/24 0315  vancomycin  (VANCOREADY) IVPB 1750 mg/350 mL        1,750 mg 175 mL/hr over 120 Minutes Intravenous  Once 09/13/24 0307 09/13/24 0816       Data Reviewed: I have personally reviewed following labs and imaging studies CBC: Recent Labs  Lab 09/13/24 0320 09/14/24 0116 09/14/24 0640 09/14/24 1310 09/15/24 0431 09/16/24 0454 09/17/24 0445  WBC 11.4*  --  11.4*  --  13.7* 14.7* 11.9*  NEUTROABS 8.4*  --   --   --  11.2* 12.2* 10.8*  HGB 11.2*   < > 9.6* 9.3* 8.9* 9.3* 9.5*  HCT 36.5*   < > 30.6* 30.1* 28.5* 30.6* 32.0*  MCV 90.6  --  90.8  --  90.5 92.7 94.7  PLT 186  --  137*  --  149* 196 284   < > = values in this interval not displayed.   Basic Metabolic Panel: Recent Labs  Lab 09/13/24 0320 09/14/24 0640 09/15/24 0431 09/16/24 0454 09/17/24 0445  NA 137 138 136 135 137  K 3.9 3.8 3.6 3.7 5.0  CL 94* 98 97* 96* 95*  CO2 33* 32 30 33* 32  GLUCOSE 159* 128* 125* 122* 157*  BUN 29* 21 21 20 22   CREATININE 0.96 0.88 0.82 0.74 0.80  CALCIUM  9.0 7.8* 7.6* 7.3* 7.2*  MG  --   --  1.4* 2.1 1.9  PHOS  --   --  2.3* 2.1* 2.3*   GFR: Estimated Creatinine Clearance: 89.6 mL/min (by C-G formula based on SCr of 0.8 mg/dL). Liver Function Tests: Recent Labs  Lab 09/13/24 0320  09/15/24 0431 09/16/24 0454 09/17/24 0445  AST 68* 33 30 43*  ALT 92* 39 35 32  ALKPHOS 208* 165* 163* 177*  BILITOT 0.5 0.4 0.3 0.4  PROT 7.3 5.9* 5.8* 6.4*  ALBUMIN 3.8 2.8* 2.9* 3.0*   CBG: Recent Labs  Lab 09/12/24 [id number]  09/13/24 0311  GLUCAP 95 152*    Recent Results (from the past 240 hours)  Resp panel by RT-PCR (RSV, Flu A&B, Covid) Anterior Nasal Swab     Status: None   Collection Time: 09/13/24  3:20 AM   Specimen: Anterior Nasal Swab  Result Value Ref Range Status   SARS Coronavirus 2 by RT PCR NEGATIVE NEGATIVE Final    Comment: (NOTE) SARS-CoV-2 target nucleic acids are NOT DETECTED.  The SARS-CoV-2 RNA is generally detectable in upper respiratory specimens during the acute phase of infection. The lowest concentration of SARS-CoV-2 viral copies this assay can detect is 138 copies/mL. A negative result does not preclude SARS-Cov-2 infection and should not be used as the sole basis for treatment or other patient management decisions. A negative result may occur with  improper specimen collection/handling, submission of specimen other than nasopharyngeal swab, presence of viral mutation(s) within the areas targeted by this assay, and inadequate number of viral copies(<138 copies/mL). A negative result must be combined with clinical observations, patient history, and epidemiological information. The expected result is Negative.  Fact Sheet for Patients:  bloggercourse.com  Fact Sheet for Healthcare Providers:  seriousbroker.it  This test is no t yet approved or cleared by the [place]  FDA and  has been authorized for detection and/or diagnosis of SARS-CoV-2 by FDA under an Emergency Use Authorization (EUA). This EUA will remain  in effect (meaning this test can be used) for the duration of the COVID-19 declaration under Section 564(b)(1) of the Act, 21 U.S.C.section 360bbb-3(b)(1), unless the  authorization is terminated  or revoked sooner.       Influenza A by PCR NEGATIVE NEGATIVE Final   Influenza B by PCR NEGATIVE NEGATIVE Final    Comment: (NOTE) The Xpert Xpress SARS-CoV-2/FLU/RSV plus assay is intended as an aid in the diagnosis of influenza from Nasopharyngeal swab specimens and should not be used as a sole basis for treatment. Nasal washings and aspirates are unacceptable for Xpert Xpress SARS-CoV-2/FLU/RSV testing.  Fact Sheet for Patients: bloggercourse.com  Fact Sheet for Healthcare Providers: seriousbroker.it  This test is not yet approved or cleared by the [place]  FDA and has been authorized for detection and/or diagnosis of SARS-CoV-2 by FDA under an Emergency Use Authorization (EUA). This EUA will remain in effect (meaning this test can be used) for the duration of the COVID-19 declaration under Section 564(b)(1) of the Act, 21 U.S.C. section 360bbb-3(b)(1), unless the authorization is terminated or revoked.     Resp Syncytial Virus by PCR NEGATIVE NEGATIVE Final    Comment: (NOTE) Fact Sheet for Patients: bloggercourse.com  Fact Sheet for Healthcare Providers: seriousbroker.it  This test is not yet approved or cleared by the [place]  FDA and has been authorized for detection and/or diagnosis of SARS-CoV-2 by FDA under an Emergency Use Authorization (EUA). This EUA will remain in effect (meaning this test can be used) for the duration of the COVID-19 declaration under Section 564(b)(1) of the Act, 21 U.S.C. section 360bbb-3(b)(1), unless the authorization is terminated or revoked.  Performed at [facility], [address] [place], [place] [address]   Blood Culture (routine x 2)     Status: None (Preliminary result)   Collection Time: 09/13/24  3:20 AM   Specimen: BLOOD  Result Value Ref Range Status   Specimen  Description BLOOD BLOOD RIGHT FOREARM  Final   Special Requests   Final    BOTTLES DRAWN AEROBIC AND ANAEROBIC Blood Culture adequate volume   Culture  Final    NO GROWTH 4 DAYS Performed at [facility], [address] [place], [place] [address]    Report Status PENDING  Incomplete  Blood Culture (routine x 2)     Status: None (Preliminary result)   Collection Time: 09/13/24  3:20 AM   Specimen: BLOOD  Result Value Ref Range Status   Specimen Description BLOOD BLOOD LEFT FOREARM  Final   Special Requests   Final    BOTTLES DRAWN AEROBIC AND ANAEROBIC Blood Culture adequate volume   Culture   Final    NO GROWTH 4 DAYS Performed at [facility], [address] [place], [place] [address]    Report Status PENDING  Incomplete  Body fluid culture w Gram Stain     Status: None   Collection Time: 09/13/24 10:46 AM   Specimen: PATH Cytology Pleural fluid  Result Value Ref Range Status   Specimen Description   Final    PLEURAL Performed at [facility], [address], [place], [place] [address]    Special Requests   Final    NONE Performed at [facility], [address] [place], [place] [address]    Gram Stain   Final    WBC PRESENT,BOTH PMN AND MONONUCLEAR NO ORGANISMS SEEN CYTOSPIN SMEAR    Culture   Final    NO GROWTH 3 DAYS Performed at [facility], [address], [place], [place] [address]    Report Status 09/16/2024 FINAL  Final  MRSA Next Gen by PCR, Nasal     Status: None   Collection Time: 09/15/24 12:00 PM   Specimen: Nasal Mucosa; Nasal Swab  Result Value Ref Range Status   MRSA by PCR Next Gen NOT DETECTED NOT DETECTED Final    Comment: (NOTE) The GeneXpert MRSA Assay (FDA approved for NASAL specimens only), is one component of a comprehensive MRSA colonization surveillance program. It is not intended to diagnose MRSA infection nor to guide or monitor treatment for MRSA infections. Test performance is not FDA  approved in patients less than 3 years old. Performed at [facility], [address] [place], [place] [address]      Radiology Studies: [facility] Chest [place] 1 View Result Date: 09/16/2024 CLINICAL DATA:  Hypoxia EXAM: PORTABLE CHEST 1 VIEW COMPARISON:  Chest x-ray 09/13/2024.  CT of the chest 09/13/2024. FINDINGS: Left chest port catheter tip ends in the SVC. Large bulla in the right lower hemithorax is again seen with stable small pleural effusion. Bilateral nodular densities have not significantly changed. Nodular density in the right middle lobe appears unchanged. No pneumothorax or mediastinal shift. Cardiomediastinal silhouette is stable. No acute osseous abnormality. IMPRESSION: 1. No significant change in the appearance of the chest. 2. Large bulla in the right lower hemithorax with stable small pleural effusion. 3. Bilateral nodular densities have not significantly changed. Electronically Signed   By: Greig Pique M.D.   On: 09/16/2024 18:48     Scheduled Meds:  amLODipine   5 mg Oral QHS   aspirin  EC  81 mg Oral Daily   atorvastatin   40 mg Oral QHS   calcium -vitamin D   1 tablet Oral BID   Chlorhexidine  Gluconate Cloth  6 each Topical Daily   clopidogrel   75 mg Oral Daily   enoxaparin  (LOVENOX ) injection  40 mg Subcutaneous Q24H   furosemide   40 mg Intravenous Q12H   gabapentin   300 mg Oral TID   ipratropium-albuterol   3 mL Nebulization Q6H  lactulose   10 g Oral TID   magnesium  oxide  400 mg Oral Daily   methylPREDNISolone  (SOLU-MEDROL ) injection  40 mg Intravenous Q12H   metoprolol  tartrate  50 mg Oral BID   montelukast   10 mg Oral Daily    morphine  injection  2 mg Intravenous Q4H   pantoprazole   40 mg Oral Daily   sodium chloride  flush  10-40 mL Intracatheter Q12H   Continuous Infusions:     LOS: 4 days  MDM: Patient is high risk for one or more organ failure.  They necessitate ongoing hospitalization for continued IV therapies and subsequent lab monitoring.  Total time spent interpreting labs and vitals, reviewing the medical record, coordinating care amongst consultants and care team members, directly assessing and discussing care with the patient and/or family: 55 min  Alexsus Papadopoulos, DO [facility]  To contact the attending physician between 7A-7P please use Epic Chat. To contact the covering physician during after hours 7P-7A, please review Amion.  09/17/2024, 1:07 PM   *This document has been created with the assistance of dictation software. Please excuse typographical errors. *

## 2024-09-18 ENCOUNTER — Inpatient Hospital Stay

## 2024-09-18 ENCOUNTER — Inpatient Hospital Stay: Admitting: Oncology

## 2024-09-18 DIAGNOSIS — Z789 Other specified health status: Secondary | ICD-10-CM | POA: Diagnosis not present

## 2024-09-18 DIAGNOSIS — J9601 Acute respiratory failure with hypoxia: Secondary | ICD-10-CM | POA: Diagnosis not present

## 2024-09-18 DIAGNOSIS — J189 Pneumonia, unspecified organism: Secondary | ICD-10-CM | POA: Diagnosis not present

## 2024-09-18 DIAGNOSIS — A419 Sepsis, unspecified organism: Secondary | ICD-10-CM | POA: Diagnosis not present

## 2024-09-18 DIAGNOSIS — Z515 Encounter for palliative care: Secondary | ICD-10-CM | POA: Diagnosis not present

## 2024-09-18 LAB — CULTURE, BLOOD (ROUTINE X 2)
Culture: NO GROWTH
Culture: NO GROWTH
Special Requests: ADEQUATE
Special Requests: ADEQUATE

## 2024-09-18 MED ORDER — MORPHINE SULFATE (PF) 2 MG/ML IV SOLN
1.0000 mg | Freq: Once | INTRAVENOUS | Status: AC
  Administered 2024-09-18: 1 mg via INTRAVENOUS
  Filled 2024-09-18: qty 1

## 2024-09-18 NOTE — Plan of Care (Signed)

## 2024-09-18 NOTE — Progress Notes (Signed)
 PROGRESS NOTE    Austin Patterson  FMW:[id number] DOB: 1956-10-15 DOA: 09/13/2024 PCP: Autry Grayce LABOR, PA  Chief Complaint  Patient presents with   Shortness of Breath    Hospital Course:  Austin Patterson is a 68 year old male with hypertension, hyperlipidemia, heart failure with preserved EF, stage IV lung cancer on palliative chemotherapy, asthma, COPD, paroxysmal A-fib.  He was discharged in the hospital 11/25 after being admitted with acute on chronic hypoxic respiratory failure and sepsis due to pneumonia.  He was treated with azithromycin  and Rocephin  during that hospitalization and diuresed adequately to his baseline of 3 L [place]. Patient reports he returned to the hospital as he was not getting along with the staff at his rehab facility.  Per ER documentation EMS was called due to patient dyspnea and was satting 60% on 4 L by EMS. On arrival to the ED he met sepsis criteria with tachycardia, fever, leukocytosis. In the ED he received empiric antibiotics and BiPAP for work of breathing.  He quickly weaned to 6 L. He subsequently underwent thoracentesis on 11/26 which removed 600 cc from right lung.  Follow-up CXR is much improved. Worsening respiratory status and fever again overnight on 11/28.  CXR 11/28 with worsening infiltrates with worsening right-sided effusion. After extensive discussions between myself, palliative care team, the patient and his family and friends he ultimately decided to proceed with comfort measures only and pursue hospice care on 11/30  Subjective: No acute events overnight.  On evaluation today patient does appear to have some increased work of breathing, mouth is open, some tracheal tugging appreciated.  He denies any acute needs at this time.  Appears fatigued  The patient's son, Austin Patterson is on his way to visit.  Austin Patterson's girlfriend is currently present at bedside, Austin Patterson.  She was updated on the current care plan   Objective: Vitals:   09/18/24 0301  09/18/24 0630 09/18/24 0654 09/18/24 0837  BP:    (!) 142/89  Pulse:    (!) 108  Resp:    14  Temp:    97.7 F (36.5 C)  TempSrc:    Oral  SpO2: 96% 97% 92% 91%  Weight:      Height:        Intake/Output Summary (Last 24 hours) at 09/18/2024 1240 Last data filed at 09/18/2024 0900 Gross per 24 hour  Intake 1264.26 ml  Output 1800 ml  Net -535.74 ml   Filed Weights   09/13/24 0306 09/18/24 0208  Weight: 80 kg 79.9 kg    Examination: General exam: Appears calm and comfortable, NAD  Respiratory system: Shallow respirations, tachypnea, dyspneic when speaking, coarse airway sounds bilaterally, on 6 L [place]. Cardiovascular system: Rapid irregular rhythm Gastrointestinal system: Abdomen is nondistended, soft and nontender.  Neuro: Alert and oriented. No focal neurological deficits.  Assessment & Plan:  Principal Problem:   Acute respiratory failure with hypoxia (HCC) Active Problems:   Sepsis due to pneumonia (HCC)    Comfort measures only - We are making the transition to comfort measures only this morning.  Patient has advanced stage IV lung cancer on palliative chemotherapy as well as COPD, A-fib RVR, acute on chronic hypoxic respiratory failure and presented on this admission septic. - Palliative care has been involved in our discussions - Patient has requested that we continue to update his children and his friends Austin Patterson and Austin Patterson. - We will continue with IV diuresis and steroid therapy as I do believe they are providing symptomatic support.  Will discontinue DAPT, statin, and antibiotics as these are providing little utility at this time. - Turn off all alarms - qshift vitals - Unlimited visitor status - Discontinue all labs.  Proceed only with medications that provide immediate comfort. - As needed Haldol, Ativan , morphine  remain available. - Continue with scheduled morphine .  Did need an additional dose due to increased work of breathing.  May require drip in the future.    - Pending to monitor in house.  Have consulted hospice liaison for inpatient hospice evaluation.  Have also consulted TOC for transition to long-term care with hospice services.  For now we will keep the patient in house under comfort measures.  Given his tenuous respiratory status today he may experience an in hospital death.  Sepsis due to pneumonia - Sepsis criteria on arrival tachycardia, fever, leukocytosis.  Symptoms complicated by active malignancy. - Recently completed empiric course of azithromycin  and Rocephin  and was discharged to rehab facility - Given difficulty differentiating worsening pneumonia versus active malignancy he was started on broad-spectrum antibiotics on arrival.  Unfortunately this had minimal impact on his respiratory status.  They were discontinued 11/30  - Blood cultures remain negative - Persistent leukocytosis  Acute on chronic hypoxic respiratory failure - Multifactorial.  Acute worsening on arrival complicated picture.  Thought to be worsening pneumonia despite recently completed antibiotic course.  He was initially started on cefepime , Flagyl , vancomycin .  Vancomycin  was discontinued on 11/28 as MRSA PCR was negative.  Further complicated by right-sided effusion requiring thoracentesis.  All of the above difficult to differentiate on plain film given lung masses and multiple bilateral nodules - Patient's respiratory status is now declining again.  Repeat CXR 11/29 concerning for ARDS - Continue with Lasix  and steroids as they do appear to be providing at least mild symptomatic relief - Patient is currently on high flow nasal cannula, will continue with this for now.  Likely transition to 2 L [place] soon - He is a DNR/DNI  Hypocalcemia Hypophosphatemia Hypomagnesemia -Have replaced.  Discontinue lab trending  Stage IV adenocarcinoma of the lung Active chemotherapy - Follows closely with oncology Dr. Babara - Currently receiving outpatient palliative  gemcitabine  - Protective precautions ordered - Proceed with comfort measures only as above.  Right-sided pleural effusion - In the setting of active malignancy and CHF. - 11/26 600 cc removed via thoracentesis. F/u CXR improved - 9/3 700 cc also removed -Evidence of worsening effusion on CXR 11/29 - Echocardiogram September: LVEF 55 to 60%, grade 1 diastolic dysfunction  Chronic anemia - Stable  GERD - Continue PPI  Hyperlipidemia - Statin  Paroxysmal A-fib, with RVR - Has been on metoprolol  succinate, not on anticoagulation at home. - RVR into the 120s - Continue metoprolol  tartrate now  Hypertension - Resume home meds gradually, titrate as needed  Body mass index is 27.59 kg/m. Overweight - Outpatient follow up for lifestyle modification and risk factor management  DVT prophylaxis: lovenox    Code Status: Do not attempt resuscitation (DNR) - Comfort care Disposition: Comfort measures only.  Anticipate in-hospital death.  If patient stabilizes may consider inpatient hospice Consultants:    Procedures:    Antimicrobials:  Anti-infectives (From admission, onward)    Start     Dose/Rate Route Frequency Ordered Stop   09/14/24 0600  vancomycin  (VANCOREADY) IVPB 1750 mg/350 mL  Status:  Discontinued        1,750 mg 175 mL/hr over 120 Minutes Intravenous Every 24 hours 09/13/24 0745 09/15/24 1440   09/13/24 0800  metroNIDAZOLE  (FLAGYL ) IVPB 500 mg  Status:  Discontinued        500 mg 100 mL/hr over 60 Minutes Intravenous Every 12 hours 09/13/24 0732 09/17/24 1306   09/13/24 0800  ceFEPIme  (MAXIPIME ) 2 g in sodium chloride  0.9 % 100 mL IVPB  Status:  Discontinued        2 g 200 mL/hr over 30 Minutes Intravenous Every 8 hours 09/13/24 0745 09/17/24 1306   09/13/24 0315  aztreonam  (AZACTAM ) 2 g in sodium chloride  0.9 % 100 mL IVPB        2 g 200 mL/hr over 30 Minutes Intravenous  Once 09/13/24 0305 09/13/24 0405   09/13/24 0315  metroNIDAZOLE  (FLAGYL ) IVPB 500 mg         500 mg 100 mL/hr over 60 Minutes Intravenous  Once 09/13/24 0305 09/13/24 0458   09/13/24 0315  vancomycin  (VANCOCIN ) IVPB 1000 mg/200 mL premix  Status:  Discontinued        1,000 mg 200 mL/hr over 60 Minutes Intravenous  Once 09/13/24 0305 09/13/24 0307   09/13/24 0315  vancomycin  (VANCOREADY) IVPB 1750 mg/350 mL        1,750 mg 175 mL/hr over 120 Minutes Intravenous  Once 09/13/24 0307 09/13/24 0816       Data Reviewed: I have personally reviewed following labs and imaging studies CBC: Recent Labs  Lab 09/13/24 0320 09/14/24 0116 09/14/24 0640 09/14/24 1310 09/15/24 0431 09/16/24 0454 09/17/24 0445  WBC 11.4*  --  11.4*  --  13.7* 14.7* 11.9*  NEUTROABS 8.4*  --   --   --  11.2* 12.2* 10.8*  HGB 11.2*   < > 9.6* 9.3* 8.9* 9.3* 9.5*  HCT 36.5*   < > 30.6* 30.1* 28.5* 30.6* 32.0*  MCV 90.6  --  90.8  --  90.5 92.7 94.7  PLT 186  --  137*  --  149* 196 284   < > = values in this interval not displayed.   Basic Metabolic Panel: Recent Labs  Lab 09/13/24 0320 09/14/24 0640 09/15/24 0431 09/16/24 0454 09/17/24 0445  NA 137 138 136 135 137  K 3.9 3.8 3.6 3.7 5.0  CL 94* 98 97* 96* 95*  CO2 33* 32 30 33* 32  GLUCOSE 159* 128* 125* 122* 157*  BUN 29* 21 21 20 22   CREATININE 0.96 0.88 0.82 0.74 0.80  CALCIUM  9.0 7.8* 7.6* 7.3* 7.2*  MG  --   --  1.4* 2.1 1.9  PHOS  --   --  2.3* 2.1* 2.3*   GFR: Estimated Creatinine Clearance: 89.5 mL/min (by C-G formula based on SCr of 0.8 mg/dL). Liver Function Tests: Recent Labs  Lab 09/13/24 0320 09/15/24 0431 09/16/24 0454 09/17/24 0445  AST 68* 33 30 43*  ALT 92* 39 35 32  ALKPHOS 208* 165* 163* 177*  BILITOT 0.5 0.4 0.3 0.4  PROT 7.3 5.9* 5.8* 6.4*  ALBUMIN 3.8 2.8* 2.9* 3.0*   CBG: Recent Labs  Lab 09/12/24 0821 09/13/24 0311  GLUCAP 95 152*    Recent Results (from the past 240 hours)  Resp panel by RT-PCR (RSV, Flu A&B, Covid) Anterior Nasal Swab     Status: None   Collection Time: 09/13/24  3:20 AM    Specimen: Anterior Nasal Swab  Result Value Ref Range Status   SARS Coronavirus 2 by RT PCR NEGATIVE NEGATIVE Final    Comment: (NOTE) SARS-CoV-2 target nucleic acids are NOT DETECTED.  The SARS-CoV-2 RNA is generally detectable in upper respiratory  specimens during the acute phase of infection. The lowest concentration of SARS-CoV-2 viral copies this assay can detect is 138 copies/mL. A negative result does not preclude SARS-Cov-2 infection and should not be used as the sole basis for treatment or other patient management decisions. A negative result may occur with  improper specimen collection/handling, submission of specimen other than nasopharyngeal swab, presence of viral mutation(s) within the areas targeted by this assay, and inadequate number of viral copies(<138 copies/mL). A negative result must be combined with clinical observations, patient history, and epidemiological information. The expected result is Negative.  Fact Sheet for Patients:  bloggercourse.com  Fact Sheet for Healthcare Providers:  seriousbroker.it  This test is no t yet approved or cleared by the [place]  FDA and  has been authorized for detection and/or diagnosis of SARS-CoV-2 by FDA under an Emergency Use Authorization (EUA). This EUA will remain  in effect (meaning this test can be used) for the duration of the COVID-19 declaration under Section 564(b)(1) of the Act, 21 U.S.C.section 360bbb-3(b)(1), unless the authorization is terminated  or revoked sooner.       Influenza A by PCR NEGATIVE NEGATIVE Final   Influenza B by PCR NEGATIVE NEGATIVE Final    Comment: (NOTE) The Xpert Xpress SARS-CoV-2/FLU/RSV plus assay is intended as an aid in the diagnosis of influenza from Nasopharyngeal swab specimens and should not be used as a sole basis for treatment. Nasal washings and aspirates are unacceptable for Xpert Xpress  SARS-CoV-2/FLU/RSV testing.  Fact Sheet for Patients: bloggercourse.com  Fact Sheet for Healthcare Providers: seriousbroker.it  This test is not yet approved or cleared by the [place]  FDA and has been authorized for detection and/or diagnosis of SARS-CoV-2 by FDA under an Emergency Use Authorization (EUA). This EUA will remain in effect (meaning this test can be used) for the duration of the COVID-19 declaration under Section 564(b)(1) of the Act, 21 U.S.C. section 360bbb-3(b)(1), unless the authorization is terminated or revoked.     Resp Syncytial Virus by PCR NEGATIVE NEGATIVE Final    Comment: (NOTE) Fact Sheet for Patients: bloggercourse.com  Fact Sheet for Healthcare Providers: seriousbroker.it  This test is not yet approved or cleared by the [place]  FDA and has been authorized for detection and/or diagnosis of SARS-CoV-2 by FDA under an Emergency Use Authorization (EUA). This EUA will remain in effect (meaning this test can be used) for the duration of the COVID-19 declaration under Section 564(b)(1) of the Act, 21 U.S.C. section 360bbb-3(b)(1), unless the authorization is terminated or revoked.  Performed at [facility], [address] [place], [place] [address]   Blood Culture (routine x 2)     Status: None   Collection Time: 09/13/24  3:20 AM   Specimen: BLOOD  Result Value Ref Range Status   Specimen Description BLOOD BLOOD RIGHT FOREARM  Final   Special Requests   Final    BOTTLES DRAWN AEROBIC AND ANAEROBIC Blood Culture adequate volume   Culture   Final    NO GROWTH 5 DAYS Performed at [facility], [address], [place], [place] [address]    Report Status 09/18/2024 FINAL  Final  Blood Culture (routine x 2)     Status: None   Collection Time: 09/13/24  3:20 AM   Specimen: BLOOD  Result Value Ref Range Status    Specimen Description BLOOD BLOOD LEFT FOREARM  Final   Special Requests   Final    BOTTLES DRAWN AEROBIC AND ANAEROBIC Blood Culture adequate  volume   Culture   Final    NO GROWTH 5 DAYS Performed at [facility], [address] [place] [place], [place] [address]    Report Status 09/18/2024 FINAL  Final  Body fluid culture w Gram Stain     Status: None   Collection Time: 09/13/24 10:46 AM   Specimen: PATH Cytology Pleural fluid  Result Value Ref Range Status   Specimen Description   Final    PLEURAL Performed at [facility], [address], [place], [place] [address]    Special Requests   Final    NONE Performed at [facility], [address] [place], [place] [address]    Gram Stain   Final    WBC PRESENT,BOTH PMN AND MONONUCLEAR NO ORGANISMS SEEN CYTOSPIN SMEAR    Culture   Final    NO GROWTH 3 DAYS Performed at [facility], [address] [place], [place] [address]    Report Status 09/16/2024 FINAL  Final  MRSA Next Gen by PCR, Nasal     Status: None   Collection Time: 09/15/24 12:00 PM   Specimen: Nasal Mucosa; Nasal Swab  Result Value Ref Range Status   MRSA by PCR Next Gen NOT DETECTED NOT DETECTED Final    Comment: (NOTE) The GeneXpert MRSA Assay (FDA approved for NASAL specimens only), is one component of a comprehensive MRSA colonization surveillance program. It is not intended to diagnose MRSA infection nor to guide or monitor treatment for MRSA infections. Test performance is not FDA approved in patients less than 85 years old. Performed at [facility], [address] [place], [place] [address]      Radiology Studies: No results found.    Scheduled Meds:  amLODipine   5 mg Oral QHS   Chlorhexidine  Gluconate Cloth  6 each Topical Daily   enoxaparin  (LOVENOX ) injection  40 mg Subcutaneous Q24H   furosemide   40 mg Intravenous Q12H   gabapentin   300 mg Oral TID   lactulose  10 g Oral TID   magnesium  oxide   400 mg Oral Daily   methylPREDNISolone  (SOLU-MEDROL ) injection  40 mg Intravenous Q12H   metoprolol  tartrate  50 mg Oral BID   montelukast   10 mg Oral Daily    morphine  injection  2 mg Intravenous Q4H   pantoprazole   40 mg Oral Daily   sodium chloride  flush  10-40 mL Intracatheter Q12H   Continuous Infusions:     LOS: 5 days  MDM: Patient is high risk for one or more organ failure.  They necessitate ongoing hospitalization for continued IV therapies and subsequent lab monitoring. Total time spent interpreting labs and vitals, reviewing the medical record, coordinating care amongst consultants and care team members, directly assessing and discussing care with the patient and/or family: 55 min  Eldean Klatt, DO [facility]  To contact the attending physician between 7A-7P please use Epic Chat. To contact the covering physician during after hours 7P-7A, please review Amion.  09/18/2024, 12:40 PM   *This document has been created with the assistance of dictation software. Please excuse typographical errors. *

## 2024-09-18 NOTE — Progress Notes (Signed)
 Palliative Care Progress Note, Assessment & Plan   Patient Name: Austin Patterson       Date: 09/18/2024 DOB: [birth year]  Age: 68 y.o. MRN#: [id number] Attending Physician: Dezii, Alexandra, DO Primary Care Physician: Autry Grayce LABOR, PA Admit Date: 09/13/2024  Subjective: Endorses continuing SOB and chest pain relieved with pain medication. Ate a small amount of breakfast. Slept okay last night. Request room phone to be placed on table, which was provided.   HPI: Per previous HPI- 68 y.o. male with medical history significant for hypertension, hyperlipidemia, heart failure with preserved EF, stage IV lung cancer on palliative chemotherapy, asthma, COPD and paroxysmal A-fib just discharged on 11/25 being admitted to the hospital with recurrent acute on chronic hypoxic respiratory failure and sepsis likely due to continued pneumonia.  He was treated during this last hospitalization with empiric IV azithromycin  and Rocephin , completed a course.  He was diuresed due to his heart failure, and was noted to be euvolemic on his baseline 3 to 4 L nasal cannula oxygen  upon discharge.  Per the patient, he returned back to the hospital because he was not getting along with the staff, who he says had an attitude.  When asked, he denies any cough, shortness of breath or any significant changes.  However per ER documentation, EMS was called from his rehab facility due to patient being very short of breath without relief despite hourly breathing treatments, he was noted to be saturating 60% on 4 L nasal cannula oxygen  by EMS.  In the emergency department, patient received empiric IV antibiotics, was placed on BiPAP for work of breathing and is now significantly improved with normal saturations on 6 L nasal cannula.    Palliative  care team consulted to assist with goals of care conversations.  Summary of counseling/coordination of care: Extensive chart review completed prior to meeting patient including labs, vital signs, imaging, progress notes, orders, and available advanced directive documents from current and previous encounters.   After reviewing the patient's chart and assessing the patient at bedside, I spoke with patient in regards to symptom management and goals of care.   Ill-appearing male resting in bed. He is alert to self. He is disinterested in answering questions today. He shares his son is supposed to arrive today from [place]  for visit. Patient more lethargic today noted to be nodding off during conversation. He is in no distress.   Therapeutic silence and active listening provided for patient to share his thoughts and emotions regarding current medical situation.  Emotional support provided.  Physical Exam Vitals reviewed.  Constitutional:      General: He is not in acute distress.    Appearance: He is ill-appearing.  HENT:     Head: Normocephalic and atraumatic.     Nose:     Comments: O2 via [place] Pulmonary:     Effort: No respiratory distress.     Comments: Conversational dyspnea Musculoskeletal:     Right lower leg: No edema.     Left lower leg: No edema.  Skin:    General: Skin is warm and dry.  Neurological:     Mental Status: He is alert.    Recommendations/Plan:  Focus of care is comfort and dignity allowing for natural death Utilize ordered medications to maintain comfort Anticipate hospital death                Total Time 50 minutes   Discussed palliative plan of care with Dr. Dezii   Time spent includes: Detailed review of medical records (labs, imaging, vital signs), medically appropriate exam (mental status, respiratory, cardiac, skin), discussed with treatment team, counseling and educating patient, family and staff, documenting clinical information, medication  management and coordination of care.     Devere Sacks, AMANDA [facility] Palliative Medicine Team  09/18/2024 8:50 AM  Office [phone]  Pager [phone]

## 2024-09-18 NOTE — Progress Notes (Signed)
 Comfort care rounds completed with Austin Patterson, BMT. Patient repositioned, per request, and provided with water . Family member/friend at bedside. Patient does appear to have some labored breathing/shortness of breath. Bedside RN made aware.

## 2024-09-18 NOTE — Plan of Care (Signed)
 Problem: Education: Goal: Knowledge of General Education information will improve Description: Including pain rating scale, medication(s)/side effects and non-pharmacologic comfort measures 09/18/2024 1639 by Joshua Maurilio SQUIBB, RN Outcome: Progressing 09/18/2024 1639 by Joshua Maurilio SQUIBB, RN Outcome: Not Progressing   Problem: Health Behavior/Discharge Planning: Goal: Ability to manage health-related needs will improve 09/18/2024 1639 by Joshua Maurilio SQUIBB, RN Outcome: Progressing 09/18/2024 1639 by Joshua Maurilio SQUIBB, RN Outcome: Not Progressing   Problem: Clinical Measurements: Goal: Ability to maintain clinical measurements within normal limits will improve 09/18/2024 1639 by Joshua Maurilio SQUIBB, RN Outcome: Progressing 09/18/2024 1639 by Joshua Maurilio SQUIBB, RN Outcome: Not Progressing Goal: Will remain free from infection 09/18/2024 1639 by Joshua Maurilio SQUIBB, RN Outcome: Progressing 09/18/2024 1639 by Joshua Maurilio SQUIBB, RN Outcome: Not Progressing Goal: Diagnostic test results will improve 09/18/2024 1639 by Joshua Maurilio SQUIBB, RN Outcome: Progressing 09/18/2024 1639 by Joshua Maurilio SQUIBB, RN Outcome: Not Progressing Goal: Respiratory complications will improve Outcome: Not Progressing Goal: Cardiovascular complication will be avoided 09/18/2024 1639 by Joshua Maurilio SQUIBB, RN Outcome: Progressing 09/18/2024 1639 by Joshua Maurilio SQUIBB, RN Outcome: Not Progressing   Problem: Activity: Goal: Risk for activity intolerance will decrease 09/18/2024 1639 by Joshua Maurilio SQUIBB, RN Outcome: Progressing 09/18/2024 1639 by Joshua Maurilio SQUIBB, RN Outcome: Not Progressing   Problem: Nutrition: Goal: Adequate nutrition will be maintained 09/18/2024 1639 by Joshua Maurilio SQUIBB, RN Outcome: Progressing 09/18/2024 1639 by Joshua Maurilio SQUIBB, RN Outcome: Not Progressing   Problem: Coping: Goal: Level of anxiety will decrease 09/18/2024 1639 by Joshua Maurilio SQUIBB, RN Outcome: Progressing 09/18/2024 1639 by Joshua Maurilio SQUIBB, RN Outcome: Not Progressing   Problem:  Elimination: Goal: Will not experience complications related to bowel motility 09/18/2024 1639 by Joshua Maurilio SQUIBB, RN Outcome: Progressing 09/18/2024 1639 by Joshua Maurilio SQUIBB, RN Outcome: Not Progressing Goal: Will not experience complications related to urinary retention 09/18/2024 1639 by Joshua Maurilio SQUIBB, RN Outcome: Progressing 09/18/2024 1639 by Joshua Maurilio SQUIBB, RN Outcome: Not Progressing   Problem: Pain Managment: Goal: General experience of comfort will improve and/or be controlled 09/18/2024 1639 by Joshua Maurilio SQUIBB, RN Outcome: Progressing 09/18/2024 1639 by Joshua Maurilio SQUIBB, RN Outcome: Not Progressing   Problem: Safety: Goal: Ability to remain free from injury will improve 09/18/2024 1639 by Joshua Maurilio SQUIBB, RN Outcome: Progressing 09/18/2024 1639 by Joshua Maurilio SQUIBB, RN Outcome: Not Progressing   Problem: Skin Integrity: Goal: Risk for impaired skin integrity will decrease 09/18/2024 1639 by Joshua Maurilio SQUIBB, RN Outcome: Progressing 09/18/2024 1639 by Joshua Maurilio SQUIBB, RN Outcome: Not Progressing   Problem: Education: Goal: Knowledge of the prescribed therapeutic regimen will improve 09/18/2024 1639 by Joshua Maurilio SQUIBB, RN Outcome: Progressing 09/18/2024 1639 by Joshua Maurilio SQUIBB, RN Outcome: Not Progressing   Problem: Coping: Goal: Ability to identify and develop effective coping behavior will improve 09/18/2024 1639 by Joshua Maurilio SQUIBB, RN Outcome: Progressing 09/18/2024 1639 by Joshua Maurilio SQUIBB, RN Outcome: Not Progressing   Problem: Clinical Measurements: Goal: Quality of life will improve 09/18/2024 1639 by Joshua Maurilio SQUIBB, RN Outcome: Progressing 09/18/2024 1639 by Joshua Maurilio SQUIBB, RN Outcome: Not Progressing   Problem: Respiratory: Goal: Verbalizations of increased ease of respirations will increase 09/18/2024 1639 by Joshua Maurilio SQUIBB, RN Outcome: Progressing 09/18/2024 1639 by Joshua Maurilio SQUIBB, RN Outcome: Not Progressing   Problem: Role Relationship: Goal: Family's ability to cope with current  situation will improve 09/18/2024 1639 by Joshua Maurilio SQUIBB, RN Outcome: Progressing 09/18/2024 1639 by Joshua Maurilio SQUIBB, RN Outcome: Not  Progressing Goal: Ability to verbalize concerns, feelings, and thoughts to partner or family member will improve 09/18/2024 1639 by Joshua Maurilio SQUIBB, RN Outcome: Progressing 09/18/2024 1639 by Joshua Maurilio SQUIBB, RN Outcome: Not Progressing   Problem: Pain Management: Goal: Satisfaction with pain management regimen will improve 09/18/2024 1639 by Joshua Maurilio SQUIBB, RN Outcome: Progressing 09/18/2024 1639 by Joshua Maurilio SQUIBB, RN Outcome: Not Progressing

## 2024-09-19 DIAGNOSIS — J9601 Acute respiratory failure with hypoxia: Secondary | ICD-10-CM | POA: Diagnosis not present

## 2024-09-19 NOTE — Progress Notes (Signed)
 Daily Progress Note   Patient Name: Austin Patterson       Date: 09/19/2024 DOB: 1955/11/02  Age: 68 y.o. MRN#: [id number] Attending Physician: Dezii, Alexandra, DO Primary Care Physician: Autry Grayce LABOR, PA Admit Date: 09/13/2024  Reason for Consultation/Follow-up: Establishing goals of care  Subjective: Notes reviewed.  Hospice liaison has met with patient and family to introduce hospice care.  PMT following for potential symptom management needs.  Currently patient is sitting in bedside chair relaxed, with his ankles crossed and arms folded behind his head, laughing and conversing with visitors at bedside.  No symptom management needs at this time.  PMT will shadow peripherally.  Length of Stay: 6  Current Medications: Scheduled Meds:   amLODipine   5 mg Oral QHS   Chlorhexidine  Gluconate Cloth  6 each Topical Daily   enoxaparin  (LOVENOX ) injection  40 mg Subcutaneous Q24H   furosemide   40 mg Intravenous Q12H   gabapentin   300 mg Oral TID   lactulose   10 g Oral TID   magnesium  oxide  400 mg Oral Daily   methylPREDNISolone  (SOLU-MEDROL ) injection  40 mg Intravenous Q12H   metoprolol  tartrate  50 mg Oral BID   montelukast   10 mg Oral Daily    morphine  injection  2 mg Intravenous Q4H   pantoprazole   40 mg Oral Daily   sodium chloride  flush  10-40 mL Intracatheter Q12H    Continuous Infusions:   PRN Meds: acetaminophen  **OR** acetaminophen , albuterol , haloperidol  **OR** haloperidol  **OR** haloperidol  lactate, HYDROcodone -acetaminophen , LORazepam  **OR** LORazepam  **OR** LORazepam , Muscle Rub, ondansetron  **OR** ondansetron  (ZOFRAN ) IV, senna-docusate, sodium chloride  flush, traZODone   Physical Exam Pulmonary:     Effort: Pulmonary effort is normal.  Neurological:     Mental  Status: He is alert.             Vital Signs: BP (!) 140/79 (BP Location: Right Arm)   Pulse 84   Temp 97.7 F (36.5 C)   Resp 17   Ht 5' 7 (1.702 m)   Wt 79.9 kg   SpO2 98%   BMI 27.59 kg/m  SpO2: SpO2: 98 % O2 Device: O2 Device: Nasal Cannula O2 Flow Rate: O2 Flow Rate (L/min): 3 L/min  Intake/output summary:  Intake/Output Summary (Last 24 hours) at 09/19/2024 1626 Last data filed at 09/19/2024 1013 Gross per 24  hour  Intake 300 ml  Output 2700 ml  Net -2400 ml   LBM: Last BM Date : 09/16/24 Baseline Weight: Weight: 80 kg Most recent weight: Weight: 79.9 kg She says  Patient Active Problem List   Diagnosis Date Noted   Acute respiratory failure with hypoxia (HCC) 09/13/2024   Sepsis due to pneumonia (HCC) 09/13/2024   Acute on chronic hypoxic respiratory failure (HCC) 09/06/2024   Acute hypoxemic respiratory failure (HCC) 07/13/2024   Pleural effusion 06/20/2024   Thrombocytopenia 03/23/2024   Anemia due to antineoplastic chemotherapy 01/27/2024   Encounter for antineoplastic chemotherapy 01/06/2024   Hemoptysis 12/29/2023   Hypocalcemia 09/06/2023   Overweight (BMI 25.0-29.9) 08/08/2023   SIRS (systemic inflammatory response syndrome) (HCC) 08/06/2023   Cancer related pain 07/05/2023   Paroxysmal atrial fibrillation (HCC) 06/16/2023   Sepsis (HCC) 06/12/2023   Macrocytosis 06/08/2023   Stage IV adenocarcinoma of lung (HCC) 05/18/2023   Swelling of right foot 05/11/2023   Heterozygous alpha 1-antitrypsin deficiency (HCC) 04/27/2023   Cough 04/03/2023   Alcohol use 04/03/2023   Constipation 12/10/2022   COPD exacerbation (HCC) 02/23/2022   GERD without esophagitis 02/23/2022   Alpha-1-antitrypsin deficiency carrier 01/10/2021   History of nonmelanoma skin cancer 12/19/2020   Asthma 03/12/2020   Chronic respiratory failure with hypoxia (HCC) 03/12/2020   Hyperlipidemia    Asthma-COPD overlap syndrome (HCC)    Bleeding nose    Alcohol abuse    RUQ  abdominal pain    Anaphylaxis 11/01/2019   Hypotension 11/01/2019   CAD (coronary artery disease) 11/01/2019   H/O heart artery stent 11/01/2019   Acute on chronic respiratory failure with hypoxia (HCC)    RUQ pain    Essential hypertension    Pulmonary fibrosis (HCC) 08/30/2019   Hypomagnesemia 03/31/2017   Hiatal hernia    Reflux esophagitis    Hematemesis without nausea    Hyponatremia 09/21/2016   Multifocal pneumonia 12/09/2015    Palliative Care Assessment & Plan   Recommendations/Plan: No symptom management needs at home. PMT will shadow peripherally.  Code Status:    Code Status Orders  (From admission, onward)           Start     Ordered   09/17/24 1306  Do not attempt resuscitation (DNR) - Comfort care  Continuous       Question Answer Comment  If patient has no pulse and is not breathing Do Not Attempt Resuscitation   In Pre-Arrest Conditions (Patient Is Breathing and Has a Pulse) Provide comfort measures. Relieve any mechanical airway obstruction. Avoid transfer unless required for comfort.   Consent: Discussion documented in EHR or advanced directives reviewed      09/17/24 1306           Code Status History     Date Active Date Inactive Code Status Order ID Comments User Context   09/13/2024 0804 09/17/2024 1306 Limited: Do not attempt resuscitation (DNR) -DNR-LIMITED -Do Not Intubate/DNI  [id number]  Zella Katha HERO, MD ED   09/06/2024 1724 09/12/2024 2329 Do not attempt resuscitation (DNR) PRE-ARREST INTERVENTIONS DESIRED [id number]  Fernand Prost, MD ED   07/13/2024 1846 07/18/2024 1827 Limited: Do not attempt resuscitation (DNR) -DNR-LIMITED -Do Not Intubate/DNI  [id number]  Lenon Marien CROME, MD ED   12/30/2023 1407 12/31/2023 1812 Limited: Do not attempt resuscitation (DNR) -DNR-LIMITED -Do Not Intubate/DNI  [id number]  Von Bellis, MD Inpatient   12/29/2023 0842 12/30/2023 1407 Full Code [id number]  Eldonna Elspeth PARAS, MD ED   08/06/2023 [phone]  08/08/2023 1725 Full Code [id number]  Awanda City, MD ED   07/30/2023 1335 07/31/2023 0512 Full Code [id number]  Gar Felton PARAS, MD HOV   06/15/2023 1254 06/22/2023 1803 DNR [id number]  Marsa Edelman, DO Inpatient   06/12/2023 1202 06/15/2023 1254 Full Code [id number]  Laurita Cort DASEN, MD ED   05/10/2023 1714 05/14/2023 1752 Full Code [id number]  Eldonna Elspeth PARAS, MD ED   05/10/2023 1140 05/10/2023 1218 Full Code [id number]  Vanice Sharper, MD HOV   04/03/2023 1523 04/05/2023 1736 Full Code [id number]  Cox, Amy N, DO ED   12/07/2022 1544 12/09/2022 1616 Full Code [id number]  Barbarann Nest, MD ED   02/23/2022 0444 02/25/2022 1929 Full Code [id number]  Mansy, Madison LABOR, MD ED   01/24/2022 1453 01/25/2022 1642 Full Code [id number]  Hilma Rankins, MD ED   02/16/2020 1729 02/17/2020 1632 Full Code [id number]  Awanda City, MD Inpatient   01/24/2020 1528 01/29/2020 2105 Full Code [id number]  Hilma Rankins, MD Inpatient   11/01/2019 2128 11/04/2019 0010 Full Code [id number]  Ricky Alfrieda DASEN, DO ED   08/30/2019 1428 09/02/2019 1539 Full Code [id number]  Sonjia Held, MD ED   06/17/2017 1814 06/18/2017 1901 Full Code [id number]  Andreas Nissen, MD Inpatient   03/31/2017 1924 04/01/2017 1426 Full Code [id number]  Tobie Calix, MD ED   09/21/2016 0914 09/23/2016 1902 Full Code [id number]  Tobie Calix, MD Inpatient   12/09/2015 1705 12/16/2015 1803 Full Code [id number]  Barbette Cea, MD Inpatient   10/14/2015 1342 10/16/2015 1629 Full Code [id number]  Andreas Nissen, MD Inpatient      Advance Directive Documentation    Flowsheet Row Most Recent Value  Type of Advance Directive Healthcare Power of Attorney, Living will  Pre-existing out of facility DNR order (yellow form or pink MOST form) --  MOST Form in Place? --    Camelia Lewis, NP  Please contact Palliative Medicine Team phone at [phone] for questions and concerns.

## 2024-09-19 NOTE — Progress Notes (Addendum)
 [facility] Liaison Note   Received request from ICM, Daved Glendia PEAK,  to meet with patient at bedside to explain hospice services at the  [facility]. Met  with patient/family at bedside and provided extensive education for hospice services.  Patent/family requested additional time to consider options before proceeding. All questions answered and no concerns voiced.   [facility] information and contact numbers given to family & above information shared with ICM.     Please call with any questions/concerns.    Thank you for the opportunity to participate in this patient's care.  [facility] Liaison [phone]

## 2024-09-19 NOTE — Progress Notes (Addendum)
 PROGRESS NOTE    Austin Patterson  FMW:[id number] DOB: Apr 06, 1956 DOA: 09/13/2024 PCP: Autry Grayce LABOR, PA  Chief Complaint  Patient presents with   Shortness of Breath    Hospital Course:  Austin Patterson is a 68 year old male with hypertension, hyperlipidemia, heart failure with preserved EF, stage IV lung cancer on palliative chemotherapy, asthma, COPD, paroxysmal A-fib.  He was discharged in the hospital 11/25 after being admitted with acute on chronic hypoxic respiratory failure and sepsis due to pneumonia.  He was treated with azithromycin  and Rocephin  during that hospitalization and diuresed adequately to his baseline of 3 L [place]. Patient reports he returned to the hospital as he was not getting along with the staff at his rehab facility.  Per ER documentation EMS was called due to patient dyspnea and was satting 60% on 4 L by EMS. On arrival to the ED he met sepsis criteria with tachycardia, fever, leukocytosis. In the ED he received empiric antibiotics and BiPAP for work of breathing.  He quickly weaned to 6 L. He subsequently underwent thoracentesis on 11/26 which removed 600 cc from right lung.  Follow-up CXR is much improved. Worsening respiratory status and fever again overnight on 11/28.  CXR 11/28 with worsening infiltrates with worsening right-sided effusion. After extensive discussions between myself, palliative care team, the patient and his family and friends he ultimately decided to proceed with comfort measures only and pursue hospice care on 11/30  Subjective: No acute events overnight.  Today patient's son Austin Patterson, and daughter Austin Patterson are both at bedside.  We discussed the expected course of this disease process.  Objective: Vitals:   09/18/24 0837 09/18/24 2021 09/19/24 0805 09/19/24 1647  BP: (!) 142/89 136/79 (!) 140/79 126/80  Pulse: (!) 108 95 84 84  Resp: 14 19 17 16   Temp: 97.7 F (36.5 C) 97.6 F (36.4 C) 97.7 F (36.5 C) 97.9 F (36.6 C)  TempSrc: Oral  Oral  Oral  SpO2: 91% 93% 98% (!) 87%  Weight:      Height:        Intake/Output Summary (Last 24 hours) at 09/19/2024 1711 Last data filed at 09/19/2024 1013 Gross per 24 hour  Intake 300 ml  Output 2700 ml  Net -2400 ml   Filed Weights   09/13/24 0306 09/18/24 0208  Weight: 80 kg 79.9 kg    Examination: General exam: Appears calm and comfortable, NAD  Respiratory system: Shallow respirations, tachypnea, dyspneic when speaking, coarse airway sounds bilaterally, on 6 L [place]. Cardiovascular system: Rapid irregular rhythm Gastrointestinal system: Abdomen is nondistended, soft and nontender.  Neuro: Alert and oriented. No focal neurological deficits.  Assessment & Plan:  Principal Problem:   Acute respiratory failure with hypoxia (HCC) Active Problems:   Sepsis due to pneumonia (HCC)    Comfort measures only -Advanced stage IV lung cancer on palliative chemotherapy as well as COPD, A-fib RVR, acute on chronic hypoxic respiratory failure and presented on this admission septic. - Palliative care has been involved in our discussions - Patient has requested that we continue to update his children and his friends Ubaldo and Marcey. - We will continue with IV diuresis and steroid therapy as I do believe they are providing symptomatic support.  Will discontinue DAPT, statin, and antibiotics as these are providing little utility at this time. - Turn off all alarms - qshift vitals - Unlimited visitor status - Discontinue all labs.  Proceed only with medications that provide immediate comfort. - As needed Haldol, Ativan ,  morphine  remain available. - Continue with scheduled morphine .  May require additional doses for work of breathing. - Evaluated by [facility] liaison today for inpatient hospice.  Family would like more time to consider their options  Sepsis due to pneumonia - Sepsis criteria on arrival tachycardia, fever, leukocytosis.  Symptoms complicated by active  malignancy. - Recently completed empiric course of azithromycin  and Rocephin  and was discharged to rehab facility - Given difficulty differentiating worsening pneumonia versus active malignancy he was started on broad-spectrum antibiotics on arrival.  Unfortunately this had minimal impact on his respiratory status.  They were discontinued 11/30  - Blood cultures remain negative - Persistent leukocytosis  Acute on chronic hypoxic respiratory failure - Multifactorial.  Acute worsening on arrival complicated picture.  Thought to be worsening pneumonia despite recently completed antibiotic course.  He was initially started on cefepime , Flagyl , vancomycin .  Vancomycin  was discontinued on 11/28 as MRSA PCR was negative.  Further complicated by right-sided effusion requiring thoracentesis.  All of the above difficult to differentiate on plain film given lung masses and multiple bilateral nodules - Patient's respiratory status is now declining again.  Repeat CXR 11/29 concerning for ARDS - Continue with Lasix  and steroids as they do appear to be providing at least mild symptomatic relief - On 2L [place] now. PRN Morphine  for respiratory distress  - He is a DNR/DNI  Hypocalcemia Hypophosphatemia Hypomagnesemia -Have replaced.  Discontinue lab trending  Stage IV adenocarcinoma of the lung Active chemotherapy - Follows closely with oncology Dr. Babara - Currently receiving outpatient palliative gemcitabine  - Protective precautions ordered - Proceed with comfort measures only as above.  Right-sided pleural effusion - In the setting of active malignancy and CHF. - 11/26 600 cc removed via thoracentesis. F/u CXR improved - 9/3 700 cc also removed -Evidence of worsening effusion on CXR 11/29 - Echocardiogram September: LVEF 55 to 60%, grade 1 diastolic dysfunction  Chronic anemia - Stable  GERD - Continue PPI  Hyperlipidemia - Statin  Paroxysmal A-fib, with RVR - Has been on metoprolol  succinate,  not on anticoagulation at home. - RVR into the 120s - Continue metoprolol  tartrate now  Hypertension - Resume home meds gradually, titrate as needed  Body mass index is 27.59 kg/m. Overweight - Outpatient follow up for lifestyle modification and risk factor management  DVT prophylaxis: DCd   Code Status: Do not attempt resuscitation (DNR) - Comfort care Disposition: Comfort measures only.  Pending family's final care plan decisions  Consultants:    Procedures:    Antimicrobials:  Anti-infectives (From admission, onward)    Start     Dose/Rate Route Frequency Ordered Stop   09/14/24 0600  vancomycin  (VANCOREADY) IVPB 1750 mg/350 mL  Status:  Discontinued        1,750 mg 175 mL/hr over 120 Minutes Intravenous Every 24 hours 09/13/24 0745 09/15/24 1440   09/13/24 0800  metroNIDAZOLE  (FLAGYL ) IVPB 500 mg  Status:  Discontinued        500 mg 100 mL/hr over 60 Minutes Intravenous Every 12 hours 09/13/24 0732 09/17/24 1306   09/13/24 0800  ceFEPIme  (MAXIPIME ) 2 g in sodium chloride  0.9 % 100 mL IVPB  Status:  Discontinued        2 g 200 mL/hr over 30 Minutes Intravenous Every 8 hours 09/13/24 0745 09/17/24 1306   09/13/24 0315  aztreonam  (AZACTAM ) 2 g in sodium chloride  0.9 % 100 mL IVPB        2 g 200 mL/hr over 30 Minutes Intravenous  Once 09/13/24 0305 09/13/24 0405   09/13/24 0315  metroNIDAZOLE  (FLAGYL ) IVPB 500 mg        500 mg 100 mL/hr over 60 Minutes Intravenous  Once 09/13/24 0305 09/13/24 0458   09/13/24 0315  vancomycin  (VANCOCIN ) IVPB 1000 mg/200 mL premix  Status:  Discontinued        1,000 mg 200 mL/hr over 60 Minutes Intravenous  Once 09/13/24 0305 09/13/24 0307   09/13/24 0315  vancomycin  (VANCOREADY) IVPB 1750 mg/350 mL        1,750 mg 175 mL/hr over 120 Minutes Intravenous  Once 09/13/24 0307 09/13/24 0816       Data Reviewed: I have personally reviewed following labs and imaging studies CBC: Recent Labs  Lab 09/13/24 0320 09/14/24 0116  09/14/24 0640 09/14/24 1310 09/15/24 0431 09/16/24 0454 09/17/24 0445  WBC 11.4*  --  11.4*  --  13.7* 14.7* 11.9*  NEUTROABS 8.4*  --   --   --  11.2* 12.2* 10.8*  HGB 11.2*   < > 9.6* 9.3* 8.9* 9.3* 9.5*  HCT 36.5*   < > 30.6* 30.1* 28.5* 30.6* 32.0*  MCV 90.6  --  90.8  --  90.5 92.7 94.7  PLT 186  --  137*  --  149* 196 284   < > = values in this interval not displayed.   Basic Metabolic Panel: Recent Labs  Lab 09/13/24 0320 09/14/24 0640 09/15/24 0431 09/16/24 0454 09/17/24 0445  NA 137 138 136 135 137  K 3.9 3.8 3.6 3.7 5.0  CL 94* 98 97* 96* 95*  CO2 33* 32 30 33* 32  GLUCOSE 159* 128* 125* 122* 157*  BUN 29* 21 21 20 22   CREATININE 0.96 0.88 0.82 0.74 0.80  CALCIUM  9.0 7.8* 7.6* 7.3* 7.2*  MG  --   --  1.4* 2.1 1.9  PHOS  --   --  2.3* 2.1* 2.3*   GFR: Estimated Creatinine Clearance: 89.5 mL/min (by C-G formula based on SCr of 0.8 mg/dL). Liver Function Tests: Recent Labs  Lab 09/13/24 0320 09/15/24 0431 09/16/24 0454 09/17/24 0445  AST 68* 33 30 43*  ALT 92* 39 35 32  ALKPHOS 208* 165* 163* 177*  BILITOT 0.5 0.4 0.3 0.4  PROT 7.3 5.9* 5.8* 6.4*  ALBUMIN 3.8 2.8* 2.9* 3.0*   CBG: Recent Labs  Lab 09/13/24 0311  GLUCAP 152*    Recent Results (from the past 240 hours)  Resp panel by RT-PCR (RSV, Flu A&B, Covid) Anterior Nasal Swab     Status: None   Collection Time: 09/13/24  3:20 AM   Specimen: Anterior Nasal Swab  Result Value Ref Range Status   SARS Coronavirus 2 by RT PCR NEGATIVE NEGATIVE Final    Comment: (NOTE) SARS-CoV-2 target nucleic acids are NOT DETECTED.  The SARS-CoV-2 RNA is generally detectable in upper respiratory specimens during the acute phase of infection. The lowest concentration of SARS-CoV-2 viral copies this assay can detect is 138 copies/mL. A negative result does not preclude SARS-Cov-2 infection and should not be used as the sole basis for treatment or other patient management decisions. A negative result may  occur with  improper specimen collection/handling, submission of specimen other than nasopharyngeal swab, presence of viral mutation(s) within the areas targeted by this assay, and inadequate number of viral copies(<138 copies/mL). A negative result must be combined with clinical observations, patient history, and epidemiological information. The expected result is Negative.  Fact Sheet for Patients:  bloggercourse.com  Fact Sheet for  Healthcare Providers:  seriousbroker.it  This test is no t yet approved or cleared by the [place]  FDA and  has been authorized for detection and/or diagnosis of SARS-CoV-2 by FDA under an Emergency Use Authorization (EUA). This EUA will remain  in effect (meaning this test can be used) for the duration of the COVID-19 declaration under Section 564(b)(1) of the Act, 21 U.S.C.section 360bbb-3(b)(1), unless the authorization is terminated  or revoked sooner.       Influenza A by PCR NEGATIVE NEGATIVE Final   Influenza B by PCR NEGATIVE NEGATIVE Final    Comment: (NOTE) The Xpert Xpress SARS-CoV-2/FLU/RSV plus assay is intended as an aid in the diagnosis of influenza from Nasopharyngeal swab specimens and should not be used as a sole basis for treatment. Nasal washings and aspirates are unacceptable for Xpert Xpress SARS-CoV-2/FLU/RSV testing.  Fact Sheet for Patients: bloggercourse.com  Fact Sheet for Healthcare Providers: seriousbroker.it  This test is not yet approved or cleared by the [place]  FDA and has been authorized for detection and/or diagnosis of SARS-CoV-2 by FDA under an Emergency Use Authorization (EUA). This EUA will remain in effect (meaning this test can be used) for the duration of the COVID-19 declaration under Section 564(b)(1) of the Act, 21 U.S.C. section 360bbb-3(b)(1), unless the authorization is terminated  or revoked.     Resp Syncytial Virus by PCR NEGATIVE NEGATIVE Final    Comment: (NOTE) Fact Sheet for Patients: bloggercourse.com  Fact Sheet for Healthcare Providers: seriousbroker.it  This test is not yet approved or cleared by the [place]  FDA and has been authorized for detection and/or diagnosis of SARS-CoV-2 by FDA under an Emergency Use Authorization (EUA). This EUA will remain in effect (meaning this test can be used) for the duration of the COVID-19 declaration under Section 564(b)(1) of the Act, 21 U.S.C. section 360bbb-3(b)(1), unless the authorization is terminated or revoked.  Performed at [facility], [address] [place], [place] [address]   Blood Culture (routine x 2)     Status: None   Collection Time: 09/13/24  3:20 AM   Specimen: BLOOD  Result Value Ref Range Status   Specimen Description BLOOD BLOOD RIGHT FOREARM  Final   Special Requests   Final    BOTTLES DRAWN AEROBIC AND ANAEROBIC Blood Culture adequate volume   Culture   Final    NO GROWTH 5 DAYS Performed at [facility], [address] [place], [place] [address]    Report Status 09/18/2024 FINAL  Final  Blood Culture (routine x 2)     Status: None   Collection Time: 09/13/24  3:20 AM   Specimen: BLOOD  Result Value Ref Range Status   Specimen Description BLOOD BLOOD LEFT FOREARM  Final   Special Requests   Final    BOTTLES DRAWN AEROBIC AND ANAEROBIC Blood Culture adequate volume   Culture   Final    NO GROWTH 5 DAYS Performed at [facility], [address], [place], [place] [address]    Report Status 09/18/2024 FINAL  Final  Body fluid culture w Gram Stain     Status: None   Collection Time: 09/13/24 10:46 AM   Specimen: PATH Cytology Pleural fluid  Result Value Ref Range Status   Specimen Description   Final    PLEURAL Performed at [facility], [address], [place], [place]  [address]    Special Requests   Final    NONE Performed at [facility], [address] [place], [place] [address]    Gram Stain   Final    WBC PRESENT,BOTH PMN AND MONONUCLEAR NO ORGANISMS SEEN CYTOSPIN SMEAR    Culture   Final    NO GROWTH 3 DAYS Performed at [facility], [address] [place], [place] [address]    Report Status 09/16/2024 FINAL  Final  MRSA Next Gen by PCR, Nasal     Status: None   Collection Time: 09/15/24 12:00 PM   Specimen: Nasal Mucosa; Nasal Swab  Result Value Ref Range Status   MRSA by PCR Next Gen NOT DETECTED NOT DETECTED Final    Comment: (NOTE) The GeneXpert MRSA Assay (FDA approved for NASAL specimens only), is one component of a comprehensive MRSA colonization surveillance program. It is not intended to diagnose MRSA infection nor to guide or monitor treatment for MRSA infections. Test performance is not FDA approved in patients less than 25 years old. Performed at [facility], [address], [place], [place] [address]      Radiology Studies: No results found.    Scheduled Meds:  amLODipine   5 mg Oral QHS   Chlorhexidine  Gluconate Cloth  6 each Topical Daily   enoxaparin  (LOVENOX ) injection  40 mg Subcutaneous Q24H   furosemide   40 mg Intravenous Q12H   gabapentin   300 mg Oral TID   lactulose  10 g Oral TID   magnesium  oxide  400 mg Oral Daily   methylPREDNISolone  (SOLU-MEDROL ) injection  40 mg Intravenous Q12H   metoprolol  tartrate  50 mg Oral BID   montelukast   10 mg Oral Daily    morphine  injection  2 mg Intravenous Q4H   pantoprazole   40 mg Oral Daily   sodium chloride  flush  10-40 mL Intracatheter Q12H   Continuous Infusions:     LOS: 6 days  MDM: Patient is high risk for one or more organ failure.  They necessitate ongoing hospitalization for continued IV therapies and subsequent lab monitoring. Total time spent interpreting labs and vitals, reviewing the medical record, coordinating care  amongst consultants and care team members, directly assessing and discussing care with the patient and/or family: 35 min  Kavir Savoca, DO [facility]  To contact the attending physician between 7A-7P please use Epic Chat. To contact the covering physician during after hours 7P-7A, please review Amion.  09/19/2024, 5:11 PM   *This document has been created with the assistance of dictation software. Please excuse typographical errors. *

## 2024-09-19 NOTE — Plan of Care (Signed)

## 2024-09-19 NOTE — TOC Progression Note (Addendum)
 Transition of Care [facility]) - Progression Note    Patient Details  Name: Austin Patterson MRN: [id number] Date of Birth: 19-Nov-1955  [facility] CM/SW Contact  Austin JONETTA Hamilton, RN Phone Number: 09/19/2024, 11:52 AM  Clinical Narrative:     Spoke with patient's son Austin Patterson via telephone, introduced self and explained role. Patient's daughter Austin Patterson present and contributed to conversation as well. Austin Patterson and Austin Patterson verbalize they would like to discuss possibility of inpatient hospice options as they are aware that if patient were to discharge to SNF with Hospice, patient's insurance will only cover the Hospice portion and they verbalize they can not pay out of pocket for patient's care.  Discussed that [name] is the facility in [place] and this CM requested permission to reach out to hospital liaison [facility] and provide him with their contact information to discuss process. [name] and Austin Patterson verbalized permission with Austin Patterson's contact number being the best [phone].   Contact [facility] and advised of the above. [place] will plan to review today, may be this evening.                     Expected Discharge Plan and Services                                               Social Drivers of Health (SDOH) Interventions SDOH Screenings   Food Insecurity: No Food Insecurity (09/15/2024)  Housing: Low Risk  (09/15/2024)  Transportation Needs: No Transportation Needs (09/15/2024)  Utilities: Not At Risk (09/15/2024)  Depression (PHQ2-9): Low Risk  (09/06/2024)  Social Connections: Socially Isolated (09/15/2024)  Tobacco Use: Medium Risk (09/06/2024)    Readmission Risk Interventions    09/08/2024    2:15 PM 06/14/2023   11:19 AM  Readmission Risk Prevention Plan  Transportation Screening Complete Complete  PCP or Specialist Appt within 3-5 Days Complete   HRI or Home Care Consult Complete   Social Work Consult for Recovery Care Planning/Counseling  Complete   Palliative Care Screening Not Applicable   Medication Review [facility]) Referral to Pharmacy Complete  PCP or Specialist appointment within 3-5 days of discharge  Complete  SW Recovery Care/Counseling Consult  Complete  Palliative Care Screening  Not Applicable  Skilled Nursing Facility  Not Applicable

## 2024-09-20 DIAGNOSIS — E878 Other disorders of electrolyte and fluid balance, not elsewhere classified: Secondary | ICD-10-CM | POA: Insufficient documentation

## 2024-09-20 DIAGNOSIS — J90 Pleural effusion, not elsewhere classified: Secondary | ICD-10-CM

## 2024-09-20 DIAGNOSIS — I480 Paroxysmal atrial fibrillation: Secondary | ICD-10-CM

## 2024-09-20 DIAGNOSIS — I5033 Acute on chronic diastolic (congestive) heart failure: Secondary | ICD-10-CM | POA: Insufficient documentation

## 2024-09-20 DIAGNOSIS — C3490 Malignant neoplasm of unspecified part of unspecified bronchus or lung: Secondary | ICD-10-CM

## 2024-09-20 DIAGNOSIS — E663 Overweight: Secondary | ICD-10-CM

## 2024-09-20 MED ORDER — ONDANSETRON HCL 4 MG/2ML IJ SOLN: 4.0000 mg | Freq: Four times a day (QID) | INTRAMUSCULAR | Status: AC | PRN

## 2024-09-20 MED ORDER — LORAZEPAM 2 MG/ML IJ SOLN: 1.0000 mg | INTRAMUSCULAR | Status: AC | PRN

## 2024-09-20 MED ORDER — ACETAMINOPHEN 325 MG PO TABS: 650.0000 mg | ORAL_TABLET | Freq: Four times a day (QID) | ORAL | Status: AC | PRN

## 2024-09-20 MED ORDER — MORPHINE SULFATE (PF) 2 MG/ML IV SOLN: 2.0000 mg | INTRAVENOUS | Status: AC | PRN

## 2024-09-20 MED ORDER — PANTOPRAZOLE SODIUM 40 MG PO TBEC: 40.0000 mg | DELAYED_RELEASE_TABLET | Freq: Every day | ORAL | Status: AC

## 2024-09-20 MED ORDER — FUROSEMIDE 10 MG/ML IJ SOLN: 40.0000 mg | Freq: Two times a day (BID) | INTRAMUSCULAR | Status: AC

## 2024-09-20 MED ORDER — ALBUTEROL SULFATE (2.5 MG/3ML) 0.083% IN NEBU: 2.5000 mg | INHALATION_SOLUTION | RESPIRATORY_TRACT | Status: AC | PRN

## 2024-09-20 MED ORDER — TRAZODONE HCL 50 MG PO TABS: 25.0000 mg | ORAL_TABLET | Freq: Every evening | ORAL | Status: AC | PRN

## 2024-09-20 MED ORDER — METOPROLOL TARTRATE 50 MG PO TABS: 50.0000 mg | ORAL_TABLET | Freq: Two times a day (BID) | ORAL | Status: AC

## 2024-09-20 MED ORDER — HALOPERIDOL LACTATE 5 MG/ML IJ SOLN: 0.5000 mg | INTRAMUSCULAR | Status: AC | PRN

## 2024-09-20 NOTE — Progress Notes (Addendum)
 Report called to [facility] [phone]. RN spoke with Clinical Biochemist.  Patient will be discharged with left chest port a cath accessed [facility] request)

## 2024-09-20 NOTE — Assessment & Plan Note (Addendum)
 Will use metoprolol  to control heart rate

## 2024-09-20 NOTE — Plan of Care (Signed)

## 2024-09-20 NOTE — Assessment & Plan Note (Signed)
 Patient made comfort care measures by palliative care and patient accepted to hospice home.  Will discharge to hospice home today.  Patient with a history of advanced stage IV lung cancer on palliative chemotherapy.  As needed Haldol, Ativan , morphine .

## 2024-09-20 NOTE — Assessment & Plan Note (Signed)
 Hypocalcemia, hypophosphatemia and hypomagnesemia replaced during hospital course.

## 2024-09-20 NOTE — Assessment & Plan Note (Signed)
 Patient made comfort care measures and will go to hospice home.

## 2024-09-20 NOTE — Assessment & Plan Note (Signed)
 Patient on 5 L high flow nasal cannula.  Chest x-ray on 11/29 concerning for ARDS.  Patient was given Lasix  and had a thoracentesis during the hospital course.

## 2024-09-20 NOTE — Assessment & Plan Note (Addendum)
 Likely worsened with atrial fibrillation, pneumonia and lung cancer and pleural effusion.  Patient was diuresed with IV Lasix  while here.  Can use Lasix  at facility.

## 2024-09-20 NOTE — Hospital Course (Signed)
 68 year old male with hypertension, hyperlipidemia, heart failure with preserved EF, stage IV lung cancer on palliative chemotherapy, asthma, COPD, paroxysmal A-fib.  He was discharged in the hospital 11/25 after being admitted with acute on chronic hypoxic respiratory failure and sepsis due to pneumonia.  He was treated with azithromycin  and Rocephin  during that hospitalization and diuresed adequately to his baseline of 3 L [place]. Patient reports he returned to the hospital as he was not getting along with the staff at his rehab facility.  Per ER documentation EMS was called due to patient dyspnea and was satting 60% on 4 L by EMS. On arrival to the ED he met sepsis criteria with tachycardia, fever, leukocytosis. In the ED he received empiric antibiotics and BiPAP for work of breathing.  He quickly weaned to 6 L. He subsequently underwent thoracentesis on 11/26 which removed 600 cc from right lung.  Follow-up CXR is much improved. Worsening respiratory status and fever again overnight on 11/28.  CXR 11/28 with worsening infiltrates with worsening right-sided effusion. After extensive discussions between myself, palliative care team, the patient and his family and friends he ultimately decided to proceed with comfort measures only and pursue hospice care on 11/30  12/3.  Patient and family interested in hospice facility.  Patient does not want to do any further Lasix .  Should be able to go to hospice home this evening.

## 2024-09-20 NOTE — Assessment & Plan Note (Signed)
 Present on admission with tachycardia, fever and leukocytosis.  Patient completed Rocephin  and Zithromax .

## 2024-09-20 NOTE — Assessment & Plan Note (Signed)
 BMI 27.59

## 2024-09-20 NOTE — Plan of Care (Signed)
  Problem: Education: Goal: Knowledge of General Education information will improve Description: Including pain rating scale, medication(s)/side effects and non-pharmacologic comfort measures Outcome: Adequate for Discharge   Problem: Health Behavior/Discharge Planning: Goal: Ability to manage health-related needs will improve Outcome: Adequate for Discharge   Problem: Clinical Measurements: Goal: Ability to maintain clinical measurements within normal limits will improve Outcome: Adequate for Discharge Goal: Will remain free from infection Outcome: Adequate for Discharge Goal: Diagnostic test results will improve Outcome: Adequate for Discharge Goal: Respiratory complications will improve Outcome: Adequate for Discharge Goal: Cardiovascular complication will be avoided Outcome: Adequate for Discharge   Problem: Activity: Goal: Risk for activity intolerance will decrease Outcome: Adequate for Discharge   Problem: Nutrition: Goal: Adequate nutrition will be maintained Outcome: Adequate for Discharge   Problem: Coping: Goal: Level of anxiety will decrease Outcome: Adequate for Discharge   Problem: Elimination: Goal: Will not experience complications related to bowel motility Outcome: Adequate for Discharge Goal: Will not experience complications related to urinary retention Outcome: Adequate for Discharge   Problem: Pain Managment: Goal: General experience of comfort will improve and/or be controlled Outcome: Adequate for Discharge   Problem: Safety: Goal: Ability to remain free from injury will improve Outcome: Adequate for Discharge   Problem: Skin Integrity: Goal: Risk for impaired skin integrity will decrease Outcome: Adequate for Discharge   Problem: Education: Goal: Knowledge of the prescribed therapeutic regimen will improve Outcome: Adequate for Discharge   Problem: Coping: Goal: Ability to identify and develop effective coping behavior will  improve Outcome: Adequate for Discharge   Problem: Clinical Measurements: Goal: Quality of life will improve Outcome: Adequate for Discharge   Problem: Respiratory: Goal: Verbalizations of increased ease of respirations will increase Outcome: Adequate for Discharge   Problem: Role Relationship: Goal: Family's ability to cope with current situation will improve Outcome: Adequate for Discharge Goal: Ability to verbalize concerns, feelings, and thoughts to partner or family member will improve Outcome: Adequate for Discharge   Problem: Pain Management: Goal: Satisfaction with pain management regimen will improve Outcome: Adequate for Discharge

## 2024-09-20 NOTE — Discharge Summary (Signed)
 Physician Discharge Summary   Patient: Austin Patterson MRN: [id number] DOB: Feb 28, 1956  Admit date:     09/13/2024  Discharge date: 09/20/24  Discharge Physician: Charlie Patterson   PCP: Autry Grayce LABOR, PA   Recommendations at discharge:   Discharge to hospice facility when bed available  Discharge Diagnoses: Principal Problem:   End of life care Active Problems:   Acute on chronic respiratory failure with hypoxia (HCC)   Sepsis due to pneumonia (HCC)   Stage IV adenocarcinoma of lung (HCC)   Paroxysmal atrial fibrillation (HCC)   Overweight (BMI 25.0-29.9)   Pleural effusion on right   Electrolyte abnormality   Acute on chronic diastolic CHF (congestive heart failure) [facility])    Hospital Course: 68 year old male with hypertension, hyperlipidemia, heart failure with preserved EF, stage IV lung cancer on palliative chemotherapy, asthma, COPD, paroxysmal A-fib.  He was discharged in the hospital 11/25 after being admitted with acute on chronic hypoxic respiratory failure and sepsis due to pneumonia.  He was treated with azithromycin  and Rocephin  during that hospitalization and diuresed adequately to his baseline of 3 L [place]. Patient reports he returned to the hospital as he was not getting along with the staff at his rehab facility.  Per ER documentation EMS was called due to patient dyspnea and was satting 60% on 4 L by EMS. On arrival to the ED he met sepsis criteria with tachycardia, fever, leukocytosis. In the ED he received empiric antibiotics and BiPAP for work of breathing.  He quickly weaned to 6 L. He subsequently underwent thoracentesis on 11/26 which removed 600 cc from right lung.  Follow-up CXR is much improved. Worsening respiratory status and fever again overnight on 11/28.  CXR 11/28 with worsening infiltrates with worsening right-sided effusion. After extensive discussions between myself, palliative care team, the patient and his family and friends he ultimately  decided to proceed with comfort measures only and pursue hospice care on 11/30  12/3.  Patient and family interested in hospice facility.  Patient does not want to do any further Lasix .  Should be able to go to hospice home this evening.  Assessment and Plan: * End of life care Patient made comfort care measures by palliative care and patient accepted to hospice home.  Will discharge to hospice home today.  Patient with a history of advanced stage IV lung cancer on palliative chemotherapy.  As needed Haldol , Ativan , morphine .  Sepsis due to pneumonia [facility]) Present on admission with tachycardia, fever and leukocytosis.  Patient completed Rocephin  and Zithromax .  Acute on chronic respiratory failure with hypoxia (HCC) Patient on 5 L high flow nasal cannula.  Chest x-ray on 11/29 concerning for ARDS.  Patient was given Lasix  and had a thoracentesis during the hospital course.  Acute on chronic diastolic CHF (congestive heart failure) (HCC) Likely worsened with atrial fibrillation, pneumonia and lung cancer and pleural effusion.  Patient was diuresed with IV Lasix  while here.  Can use Lasix  at facility.  Electrolyte abnormality Hypocalcemia, hypophosphatemia and hypomagnesemia replaced during hospital course.  Pleural effusion on right Status post thoracentesis removing 600 mL on 11/26  Overweight (BMI 25.0-29.9) BMI 27.59  Paroxysmal atrial fibrillation (HCC) Will use metoprolol  to control heart rate  Stage IV adenocarcinoma of lung [facility]) Patient made comfort care measures and will go to hospice home.         Consultants: Interventional radiology, hospice, palliative care Procedures performed: Thoracentesis Disposition: Hospice facility Diet recommendation:  Dysphagia to diet DISCHARGE MEDICATION: Allergies as of  09/20/2024       Reactions   Amoxicillin  Anaphylaxis   Tizanidine     Feet and ankle swell         Medication List     STOP taking these medications     albuterol  108 (90 Base) MCG/ACT inhaler Commonly known as: VENTOLIN  HFA Replaced by: albuterol  (2.5 MG/3ML) 0.083% nebulizer solution   amLODipine  5 MG tablet Commonly known as: NORVASC    aspirin  EC 81 MG tablet   atorvastatin  40 MG tablet Commonly known as: LIPITOR   Calcium  600/Vitamin D  600-10 MG-MCG Tabs Generic drug: Calcium  Carb-Cholecalciferol    clopidogrel  75 MG tablet Commonly known as: PLAVIX    Dupixent  300 MG/2ML Soaj Generic drug: Dupilumab    furosemide  20 MG tablet Commonly known as: LASIX  Replaced by: furosemide  10 MG/ML injection   guaiFENesin -dextromethorphan  100-10 MG/5ML syrup Commonly known as: ROBITUSSIN DM   ipratropium-albuterol  0.5-2.5 (3) MG/3ML Soln Commonly known as: DUONEB   magnesium  oxide 400 (241.3 Mg) MG tablet Commonly known as: MAG-OX   metoprolol  succinate 50 MG 24 hr tablet Commonly known as: TOPROL -XL   montelukast  10 MG tablet Commonly known as: SINGULAIR    multivitamin with minerals Tabs tablet   senna-docusate 8.6-50 MG tablet Commonly known as: Senokot-S   trolamine salicylate 10 % cream Commonly known as: ASPERCREME       TAKE these medications    acetaminophen  325 MG tablet Commonly known as: TYLENOL  Take 2 tablets (650 mg total) by mouth every 6 (six) hours as needed for mild pain (pain score 1-3) or fever (or Fever >/= 101). What changed:  medication strength how much to take reasons to take this   albuterol  (2.5 MG/3ML) 0.083% nebulizer solution Commonly known as: PROVENTIL  Take 3 mLs (2.5 mg total) by nebulization every 2 (two) hours as needed for wheezing. Replaces: albuterol  108 (90 Base) MCG/ACT inhaler   furosemide  10 MG/ML injection Commonly known as: LASIX  Inject 4 mLs (40 mg total) into the vein every 12 (twelve) hours. Replaces: furosemide  20 MG tablet   gabapentin  300 MG capsule Commonly known as: NEURONTIN  TAKE 1 CAPSULE BY MOUTH THREE TIMES A DAY   haloperidol  lactate 5 MG/ML  injection Commonly known as: HALDOL  Inject 0.1 mLs (0.5 mg total) into the vein every 4 (four) hours as needed (or delirium).   LORazepam  2 MG/ML injection Commonly known as: ATIVAN  Inject 0.5 mLs (1 mg total) into the vein every 4 (four) hours as needed for anxiety.   metoprolol  tartrate 50 MG tablet Commonly known as: LOPRESSOR  Take 1 tablet (50 mg total) by mouth 2 (two) times daily.   morphine  (PF) 2 MG/ML injection Inject 1 mL (2 mg total) into the vein every 2 (two) hours as needed (pain or shortness of breath).   Muscle Rub 10-15 % Crea Apply 1 Application topically every 6 (six) hours as needed for muscle pain.   ondansetron  4 MG/2ML Soln injection Commonly known as: ZOFRAN  Inject 2 mLs (4 mg total) into the vein every 6 (six) hours as needed for nausea.   pantoprazole  40 MG tablet Commonly known as: PROTONIX  Take 1 tablet (40 mg total) by mouth daily.   traZODone  50 MG tablet Commonly known as: DESYREL  Take 0.5 tablets (25 mg total) by mouth at bedtime as needed for sleep.        Discharge Exam: Filed Weights   09/13/24 0306 09/18/24 0208  Weight: 80 kg 79.9 kg   Physical Exam HENT:     Head: Normocephalic.  Eyes:  General: Lids are normal.     Conjunctiva/sclera: Conjunctivae normal.  Cardiovascular:     Rate and Rhythm: Normal rate and regular rhythm.     Heart sounds: Normal heart sounds, S1 normal and S2 normal.  Pulmonary:     Breath sounds: Examination of the right-middle field reveals decreased breath sounds. Examination of the left-middle field reveals decreased breath sounds. Examination of the right-lower field reveals decreased breath sounds. Examination of the left-lower field reveals decreased breath sounds. Decreased breath sounds present.  Abdominal:     Palpations: Abdomen is soft.     Tenderness: There is no abdominal tenderness.  Musculoskeletal:     Right lower leg: No swelling.     Left lower leg: No swelling.  Skin:    General:  Skin is warm.     Findings: No rash.  Neurological:     Mental Status: He is alert.      Condition at discharge: fair  The results of significant diagnostics from this hospitalization (including imaging, microbiology, ancillary and laboratory) are listed below for reference.   Imaging Studies: DG Chest Port 1 View Result Date: 09/16/2024 CLINICAL DATA:  Hypoxia EXAM: PORTABLE CHEST 1 VIEW COMPARISON:  Chest x-ray 09/13/2024.  CT of the chest 09/13/2024. FINDINGS: Left chest port catheter tip ends in the SVC. Large bulla in the right lower hemithorax is again seen with stable small pleural effusion. Bilateral nodular densities have not significantly changed. Nodular density in the right middle lobe appears unchanged. No pneumothorax or mediastinal shift. Cardiomediastinal silhouette is stable. No acute osseous abnormality. IMPRESSION: 1. No significant change in the appearance of the chest. 2. Large bulla in the right lower hemithorax with stable small pleural effusion. 3. Bilateral nodular densities have not significantly changed. Electronically Signed   By: Greig Pique M.D.   On: 09/16/2024 18:48   US  THORACENTESIS ASP PLEURAL SPACE W/IMG GUIDE Result Date: 09/13/2024 INDICATION: 68 year old male with stage IV lung cancer, right-sided pleural effusion. Request for diagnostic and therapeutic thoracentesis. EXAM: ULTRASOUND GUIDED RIGHT THORACENTESIS MEDICATIONS: 10 mL 1% lidocaine  COMPLICATIONS: None immediate. PROCEDURE: An ultrasound guided thoracentesis was thoroughly discussed with the patient and questions answered. The benefits, risks, alternatives and complications were also discussed. The patient understands and wishes to proceed with the procedure. Written consent was obtained. Ultrasound was performed to localize and mark an adequate pocket of fluid in the right chest. The area was then prepped and draped in the normal sterile fashion. 1% Lidocaine  was used for local anesthesia. Under  ultrasound guidance a 6 Fr Safe-T-Centesis catheter was introduced. Thoracentesis was performed. The catheter was removed and a dressing applied. FINDINGS: A total of approximately 600 mL of clear, yellow fluid was removed. Samples were sent to the laboratory as requested by the clinical team. IMPRESSION: Successful ultrasound guided right thoracentesis yielding 600 mL of pleural fluid. Performed by: Kacie Matthews PA-C Electronically Signed   By: Wilkie Lent M.D.   On: 09/13/2024 12:11   DG Chest Port 1 View Result Date: 09/13/2024 EXAM: 1 VIEW(S) XRAY OF THE CHEST 09/13/2024 11:15:00 AM COMPARISON: 09/13/2024 CLINICAL HISTORY: Status post thoracentesis FINDINGS: LINES, TUBES AND DEVICES: Stable left chest port. Multiple telemetry leads overlie the chest which limits evaluation. LUNGS AND PLEURA: Small right pleural effusion. Interstitial prominence likely related to COPD. Bilateral nodular opacities better appreciated on the same day CTA chest. No pneumothorax. HEART AND MEDIASTINUM: Prominent appearance of the cardiomediastinal silhouette. BONES AND SOFT TISSUES: No acute osseous abnormality. IMPRESSION: 1.  Small right pleural effusion, slightly decreased. 2. Bilateral nodular opacities, better appreciated on the same day CTA chest. 3. Interstitial prominence, likely related to COPD. Electronically signed by: Donnice Mania MD 09/13/2024 11:49 AM EST RP Workstation: [id number]   CT Angio Chest PE W and/or Wo Contrast Result Date: 09/13/2024 EXAM: CTA CHEST 09/13/2024 06:13:07 AM TECHNIQUE: CTA of the chest was performed after the administration of 100 mL of iohexol  (OMNIPAQUE ) 350 MG/ML injection. Multiplanar reformatted images are provided for review. MIP images are provided for review. Automated exposure control, iterative reconstruction, and/or weight based adjustment of the mA/kV was utilized to reduce the radiation dose to as low as reasonably achievable. COMPARISON: CT of the chest dated  09/06/2024. CLINICAL HISTORY: Pulmonary embolism (PE) suspected, high prob. FINDINGS: PULMONARY ARTERIES: Pulmonary arteries are adequately opacified for evaluation. There is no evidence of pulmonary embolic disease. Main pulmonary artery is normal in caliber. MEDIASTINUM: The heart is enlarged and there is mild-to-moderate calcific coronary artery disease. There is moderate calcific atheromatous disease within the thoracic aorta. A left internal jugular chest port is present. LYMPH NODES: There is mild mediastinal and bilateral hilar lymphadenopathy, as before. No axillary lymphadenopathy. LUNGS AND PLEURA: There are numerous metastatic nodules again demonstrated throughout the lungs, which have not changed appreciably in the interim. Large bullous again demonstrated anteriorly within the right lower lobe. There is a mild-to-moderate right-sided pleural effusion, similar to the prior exam. No pneumothorax. No focal consolidation or pulmonary edema. UPPER ABDOMEN: Limited images of the upper abdomen are unremarkable. SOFT TISSUES AND BONES: The patient is status post vertebral augmentation T10 and T6. There are posttraumatic deformities of the scapular spines bilaterally. No acute soft tissue abnormality. IMPRESSION: 1. No evidence of pulmonary embolic disease. 2. Numerous metastatic nodules throughout the lungs, stable compared to prior study. 3. Large bullous changes in the right lower lobe, stable compared to prior study. 4. Mild-to-moderate right-sided pleural effusion, similar to the prior exam. 5. Enlarged heart with mild-to-moderate calcific coronary artery disease. 6. Mild mediastinal and bilateral hilar lymphadenopathy, as before. Electronically signed by: Evalene Coho MD 09/13/2024 06:43 AM EST RP Workstation: [id number]   CT ABDOMEN PELVIS W CONTRAST Result Date: 09/13/2024 EXAM: CT ABDOMEN AND PELVIS WITH CONTRAST 09/13/2024 06:13:07 AM TECHNIQUE: CT of the abdomen and pelvis was performed with  the administration of 100 mL of iohexol  (OMNIPAQUE ) 350 MG/ML injection. Multiplanar reformatted images are provided for review. Automated exposure control, iterative reconstruction, and/or weight-based adjustment of the mA/kV was utilized to reduce the radiation dose to as low as reasonably achievable. COMPARISON: CT of the abdomen and pelvis dated 06/12/2024. CLINICAL HISTORY: Sepsis. FINDINGS: LOWER CHEST: Since the previous study, a pleural based mass present posteriorly along the left lung base has increased in size from approximately 5.2 x 3.9 cm to approximately 6.5 x 3.9 cm. An ovoid lesion previously noted anteriorly on the right has also increased in size in the interim from 3.7 x 3.2 cm to approximately 4.4 x 3.5 cm. The patient has also developed numerous metastatic nodules within the lung bases, primarily on the left. There are at least 15 new nodules with the largest measuring 15 mm in diameter, which is seen within the lingula on image 13 of series 4, measuring 15 mm in diameter. An irregular nodular lesion previously noted laterally within the right lower lobe has also enlarged. There is a mild-to-moderate right-sided pleural effusion. LIVER: There has been interval development of at least 3 new metastatic lesions within segment 7 of the  liver, with the largest seen on image 14 of series 2, measuring approximately 19 x 18 mm. GALLBLADDER AND BILE DUCTS: Gallbladder is unremarkable. No biliary ductal dilatation. SPLEEN: No acute abnormality. PANCREAS: No acute abnormality. ADRENAL GLANDS: Bilateral adrenal nodules again demonstrated, which are unchanged in the interim. KIDNEYS, URETERS AND BLADDER: There is mild bilateral renal cortical atrophy. No stones in the kidneys or ureters. No hydronephrosis. No perinephric or periureteral stranding. Urinary bladder is unremarkable. GI AND BOWEL: Stomach demonstrates no acute abnormality. There is no bowel obstruction. PERITONEUM AND RETROPERITONEUM: No  ascites. No free air. VASCULATURE: The abdominal aorta is normal in caliber, but densely calcified. LYMPH NODES: No lymphadenopathy. REPRODUCTIVE ORGANS: No acute abnormality. BONES AND SOFT TISSUES: There is mild-to-moderate levoscoliosis of the thoracolumbar spine. The patient is status post vertebral augmentation at T10. There is no convincing evidence of osseous metastatic disease. No focal soft tissue abnormality. IMPRESSION: 1. Interval increase in size of pleural-based left basilar mass and right anterior ovoid pleural lesion with development of numerous new pulmonary metastatic nodules (at least 15, largest 15 mm in the lingula) and enlargement of a previously noted irregular right lower lobe nodule, consistent with progressive thoracic metastatic disease; mild-to-moderate right pleural effusion. 2. Interval development of at least three new hepatic metastatic lesions in segment 7, largest 19 x 18 mm, consistent with progressive hepatic metastatic disease. 3. No convincing evidence of osseous metastatic disease. Electronically signed by: Evalene Coho MD 09/13/2024 06:36 AM EST RP Workstation: [id number]   CT HEAD WO CONTRAST ( ) Result Date: 09/13/2024 EXAM: CT HEAD WITHOUT 09/13/2024 06:13:07 AM TECHNIQUE: CT of the head was performed without the administration of intravenous contrast. Automated exposure control, iterative reconstruction, and/or weight based adjustment of the mA/kV was utilized to reduce the radiation dose to as low as reasonably achievable. COMPARISON: CT of the head dated 05/12/2023. CLINICAL HISTORY: Mental status change, unknown cause. FINDINGS: BRAIN AND VENTRICLES: No acute intracranial hemorrhage. No mass effect or midline shift. No extra-axial fluid collection. No evidence of acute infarct. No hydrocephalus. Patchy white matter hypodensities, compatible with chronic microvascular ischemic disease. Calcific atherosclerosis. ORBITS: No acute abnormality. SINUSES AND  MASTOIDS: Mild polypoid mucosal disease within the floor of the left maxillary sinus. SOFT TISSUES AND SKULL: No acute skull fracture. No acute soft tissue abnormality. IMPRESSION: 1. No acute intracranial abnormality. 2. Patchy white matter hypodensities, compatible with chronic microvascular ischemic disease. 3. Calcific atherosclerosis. 4. Mild polypoid mucosal disease within the floor of the left maxillary sinus. Electronically signed by: Evalene Coho MD 09/13/2024 06:16 AM EST RP Workstation: [id number]   DG Chest Port 1 View Result Date: 09/13/2024 EXAM: 1 VIEW(S) XRAY OF THE CHEST 09/13/2024 03:27:00 AM COMPARISON: 09/06/2024 CLINICAL HISTORY: Possible sepsis and shortness of breath FINDINGS: LINES, TUBES AND DEVICES: Left chest port stable in place. LUNGS AND PLEURA: Decreased right pleural effusion, now small. Diffuse interstitial prominence. Unchanged patchy nodular densities at the right middle lobe and medial left lung base. Stable nodular densities at the left lung base. No pneumothorax. HEART AND MEDIASTINUM: No acute abnormality of the cardiac and mediastinal silhouettes. BONES AND SOFT TISSUES: Skin fold is noted over the right chest. No acute osseous abnormality. IMPRESSION: 1. Decreased right pleural effusion, now small. 2. Diffuse interstitial prominence. 3. Unchanged patchy nodular densities at the right middle lobe and medial left lung base. Electronically signed by: Oneil Devonshire MD 09/13/2024 03:30 AM EST RP Workstation: [id number]   CT CHEST WO CONTRAST Result Date: 09/06/2024 CLINICAL DATA:  Pneumonia.  History of lung cancer. EXAM: CT CHEST WITHOUT CONTRAST TECHNIQUE: Multidetector CT imaging of the chest was performed following the standard protocol without IV contrast. RADIATION DOSE REDUCTION: This exam was performed according to the departmental dose-optimization program which includes automated exposure control, adjustment of the mA and/or kV according to patient size and/or  use of iterative reconstruction technique. COMPARISON:  Chest x-ray same day.  CT angiogram chest 07/13/2024. FINDINGS: Cardiovascular: There is a trace pericardial effusion, unchanged. Heart is mildly enlarged. Aorta is normal in size. There are atherosclerotic calcifications of the aorta and coronary arteries. Left chest port catheter tip ends in the SVC. Mediastinum/Nodes: No enlarged mediastinal or axillary lymph nodes. Thyroid  gland, trachea, and esophagus demonstrate no significant findings. Lungs/Pleura: Left pleural effusion has nearly completely resolved. Small to moderate-sized right pleural effusion is unchanged, partially loculated. Severe emphysema again seen with large bulla in the right lower hemithorax. There are numerous new bilateral pulmonary nodules, more significant surrounding the lung lung measuring up to 14 mm. There is a new nodular mass in the right lung base measuring 2.6 cm image 3/110. Masslike density in the anterior right lower hemithorax extending to the hilum appears unchanged. There is no new focal lung infiltrate. Upper Abdomen: No acute abnormality. Musculoskeletal: There are vertebroplasty changes are similar to prior. No new fractures are identified. IMPRESSION: 1. New bilateral pulmonary nodules worrisome for metastatic disease. 2. Stable masslike density in the anterior right lower hemithorax extending to the hilum. 3. Stable small to moderate-sized right pleural effusion, partially loculated. 4. Near complete resolution of left pleural effusion. 5. Stable trace pericardial effusion. Aortic Atherosclerosis (ICD10-I70.0) and Emphysema (ICD10-J43.9). Electronically Signed   By: Greig Pique M.D.   On: 09/06/2024 19:46   DG Chest Port 1 View Result Date: 09/06/2024 EXAM: 1 VIEW(S) XRAY OF THE CHEST 09/06/2024 02:55:00 PM COMPARISON: 08/23/2024 CLINICAL HISTORY: Oxygen  dependent [id number] FINDINGS: LINES, TUBES AND DEVICES: Left chest port with tip at SVC. LUNGS AND PLEURA: The  known right middle lobe and medial left lung base lesions are suboptimally visualized on the current radiograph. Again seen is diffuse interstitial prominence with progressive patchy nodular densities in the left base, which may reflect aspiration or pneumonia. Small to moderate right pleural effusion is slightly increased in the interval. No pneumothorax. HEART AND MEDIASTINUM: No acute abnormality of the cardiac and mediastinal silhouettes. BONES AND SOFT TISSUES: No acute osseous abnormality. IMPRESSION: 1. Diffuse interstitial prominence with progressive patchy nodular densities in the left lung base, possibly reflecting aspiration or pneumonia. 2. Small to moderate right pleural effusion, slightly increased in the interval. Electronically signed by: Waddell Calk MD 09/06/2024 03:48 PM EST RP Workstation: [id number]   DG Chest 2 View Result Date: 08/25/2024 EXAM: 2 VIEW(S) XRAY OF THE CHEST 08/23/2024 12:43:00 PM COMPARISON: 07/14/2024 CLINICAL HISTORY: lung cancer, pleural effusion follow up FINDINGS: LINES, TUBES AND DEVICES: Left internal jugular port-a-cath is unchanged. LUNGS AND PLEURA: Right pleural effusion is again noted with associated right basilar atelectasis or infiltrate. No pulmonary edema. No pneumothorax. HEART AND MEDIASTINUM: No acute abnormality of the cardiac and mediastinal silhouettes. BONES AND SOFT TISSUES: No acute osseous abnormality. IMPRESSION: 1. Small right pleural effusion with associated right basilar atelectasis or infiltrate. Electronically signed by: Lynwood Seip MD 08/25/2024 01:12 PM EST RP Workstation: [id number]    Microbiology: Results for orders placed or performed during the hospital encounter of 09/13/24  Resp panel by RT-PCR (RSV, Flu A&B, Covid) Anterior Nasal Swab     Status: None  Collection Time: 09/13/24  3:20 AM   Specimen: Anterior Nasal Swab  Result Value Ref Range Status   SARS Coronavirus 2 by RT PCR NEGATIVE NEGATIVE Final    Comment:  (NOTE) SARS-CoV-2 target nucleic acids are NOT DETECTED.  The SARS-CoV-2 RNA is generally detectable in upper respiratory specimens during the acute phase of infection. The lowest concentration of SARS-CoV-2 viral copies this assay can detect is 138 copies/mL. A negative result does not preclude SARS-Cov-2 infection and should not be used as the sole basis for treatment or other patient management decisions. A negative result may occur with  improper specimen collection/handling, submission of specimen other than nasopharyngeal swab, presence of viral mutation(s) within the areas targeted by this assay, and inadequate number of viral copies(<138 copies/mL). A negative result must be combined with clinical observations, patient history, and epidemiological information. The expected result is Negative.  Fact Sheet for Patients:  bloggercourse.com  Fact Sheet for Healthcare Providers:  seriousbroker.it  This test is no t yet approved or cleared by the [place]  FDA and  has been authorized for detection and/or diagnosis of SARS-CoV-2 by FDA under an Emergency Use Authorization (EUA). This EUA will remain  in effect (meaning this test can be used) for the duration of the COVID-19 declaration under Section 564(b)(1) of the Act, 21 U.S.C.section 360bbb-3(b)(1), unless the authorization is terminated  or revoked sooner.       Influenza A by PCR NEGATIVE NEGATIVE Final   Influenza B by PCR NEGATIVE NEGATIVE Final    Comment: (NOTE) The Xpert Xpress SARS-CoV-2/FLU/RSV plus assay is intended as an aid in the diagnosis of influenza from Nasopharyngeal swab specimens and should not be used as a sole basis for treatment. Nasal washings and aspirates are unacceptable for Xpert Xpress SARS-CoV-2/FLU/RSV testing.  Fact Sheet for Patients: bloggercourse.com  Fact Sheet for Healthcare  Providers: seriousbroker.it  This test is not yet approved or cleared by the [place]  FDA and has been authorized for detection and/or diagnosis of SARS-CoV-2 by FDA under an Emergency Use Authorization (EUA). This EUA will remain in effect (meaning this test can be used) for the duration of the COVID-19 declaration under Section 564(b)(1) of the Act, 21 U.S.C. section 360bbb-3(b)(1), unless the authorization is terminated or revoked.     Resp Syncytial Virus by PCR NEGATIVE NEGATIVE Final    Comment: (NOTE) Fact Sheet for Patients: bloggercourse.com  Fact Sheet for Healthcare Providers: seriousbroker.it  This test is not yet approved or cleared by the [place]  FDA and has been authorized for detection and/or diagnosis of SARS-CoV-2 by FDA under an Emergency Use Authorization (EUA). This EUA will remain in effect (meaning this test can be used) for the duration of the COVID-19 declaration under Section 564(b)(1) of the Act, 21 U.S.C. section 360bbb-3(b)(1), unless the authorization is terminated or revoked.  Performed at [facility], [address] [place], [place] [address]   Blood Culture (routine x 2)     Status: None   Collection Time: 09/13/24  3:20 AM   Specimen: BLOOD  Result Value Ref Range Status   Specimen Description BLOOD BLOOD RIGHT FOREARM  Final   Special Requests   Final    BOTTLES DRAWN AEROBIC AND ANAEROBIC Blood Culture adequate volume   Culture   Final    NO GROWTH 5 DAYS Performed at [facility], [address], [place], [place] [address]    Report Status 09/18/2024 FINAL  Final  Blood Culture (routine x 2)  Status: None   Collection Time: 09/13/24  3:20 AM   Specimen: BLOOD  Result Value Ref Range Status   Specimen Description BLOOD BLOOD LEFT FOREARM  Final   Special Requests   Final    BOTTLES DRAWN AEROBIC AND ANAEROBIC Blood Culture  adequate volume   Culture   Final    NO GROWTH 5 DAYS Performed at [facility], [address], [place], [place] [address]    Report Status 09/18/2024 FINAL  Final  Body fluid culture w Gram Stain     Status: None   Collection Time: 09/13/24 10:46 AM   Specimen: PATH Cytology Pleural fluid  Result Value Ref Range Status   Specimen Description   Final    PLEURAL Performed at [facility], [address], [place], [place] [address]    Special Requests   Final    NONE Performed at [facility], [address] [place], [place] [address]    Gram Stain   Final    WBC PRESENT,BOTH PMN AND MONONUCLEAR NO ORGANISMS SEEN CYTOSPIN SMEAR    Culture   Final    NO GROWTH 3 DAYS Performed at [facility], [address] [place], [place] [address]    Report Status 09/16/2024 FINAL  Final  MRSA Next Gen by PCR, Nasal     Status: None   Collection Time: 09/15/24 12:00 PM   Specimen: Nasal Mucosa; Nasal Swab  Result Value Ref Range Status   MRSA by PCR Next Gen NOT DETECTED NOT DETECTED Final    Comment: (NOTE) The GeneXpert MRSA Assay (FDA approved for NASAL specimens only), is one component of a comprehensive MRSA colonization surveillance program. It is not intended to diagnose MRSA infection nor to guide or monitor treatment for MRSA infections. Test performance is not FDA approved in patients less than 24 years old. Performed at [facility], [address] [place], [place] [address]     Labs: CBC: Recent Labs  Lab 09/14/24 [phone] 09/14/24 1310 09/15/24 0431 09/16/24 0454 09/17/24 0445  WBC 11.4*  --  13.7* 14.7* 11.9*  NEUTROABS  --   --  11.2* 12.2* 10.8*  HGB 9.6* 9.3* 8.9* 9.3* 9.5*  HCT 30.6* 30.1* 28.5* 30.6* 32.0*  MCV 90.8  --  90.5 92.7 94.7  PLT 137*  --  149* 196 284   Basic Metabolic Panel: Recent Labs  Lab 09/14/24 0640 09/15/24 0431 09/16/24 0454 09/17/24 0445  NA 138 136 135 137  K 3.8 3.6 3.7 5.0   CL 98 97* 96* 95*  CO2 32 30 33* 32  GLUCOSE 128* 125* 122* 157*  BUN 21 21 20 22   CREATININE 0.88 0.82 0.74 0.80  CALCIUM  7.8* 7.6* 7.3* 7.2*  MG  --  1.4* 2.1 1.9  PHOS  --  2.3* 2.1* 2.3*   Liver Function Tests: Recent Labs  Lab 09/15/24 0431 09/16/24 0454 09/17/24 0445  AST 33 30 43*  ALT 39 35 32  ALKPHOS 165* 163* 177*  BILITOT 0.4 0.3 0.4  PROT 5.9* 5.8* 6.4*  ALBUMIN 2.8* 2.9* 3.0*   CBG: No results for input(s): GLUCAP in the last 168 hours.  Discharge time spent: greater than 30 minutes.  Signed: Charlie Patterson, MD [facility] 09/20/2024

## 2024-09-20 NOTE — Assessment & Plan Note (Signed)
 Status post thoracentesis removing 600 mL on 11/26

## 2024-09-20 NOTE — Progress Notes (Addendum)
 Daily Progress Note   Patient Name: Austin Patterson       Date: 09/20/2024 DOB: 10/11/1948  Age: 68 y.o. MRN#: [id number] Attending Physician: Al-Sultani, Anmar, MD Primary Care Physician: Tobie Domino, MD Admit Date: 09/08/2024  Reason for Consultation/Follow-up: Establishing goals of care  Subjective: Notes reviewed.  In to see patient.  He is currently sitting in bed at this time with a friend at bedside.  His friend discusses that a few days ago, he thought that the patient would be dying in the hospital imminently.  He states he has improved since that time.  Patient's son and daughter arrived at bedside.  Friend stepped out to room.  We discussed his status.  Son discusses that they just arrived yesterday from [place]  as they were under the impression that the patient would be dying within a matter of days.   Questions answered regarding hospice at home versus facility.  Patient discusses not wanting to use Lasix  as this makes him have to pee more.  Discussed the purpose of Lasix .  Discussed symptom management.  No changes to symptom management indicated at this time.  Attending in to bedside towards end of my conversation.  PMT stepped out.  Case reviewed with hospice liaison.  Patient pending hospice facility placement, and later approved.  Length of Stay: 11  Current Medications: Scheduled Meds:   vitamin C  500 mg Per Tube BID   aspirin   81 mg Per NG tube Daily   atorvastatin   80 mg Per NG tube Daily   carvedilol  12.5 mg Per NG tube BID WC   collagenase   Topical Daily   cyanocobalamin   1,000 mcg Intramuscular Q0600   feeding supplement (PROSource TF20)  60 mL Per Tube Daily   fiber supplement (BANATROL TF)  60 mL Per Tube BID   free water   200 mL Per Tube Q4H   insulin aspart   0-15 Units Subcutaneous Q4H   latanoprost  1 drop Both Eyes QHS   losartan   25 mg Per Tube Daily    morphine  injection  2 mg Intravenous Once   multivitamin with minerals  1 tablet Per Tube Daily   mouth rinse  15 mL Mouth Rinse 4 times per day   zinc sulfate (50mg  elemental zinc)  220 mg Per Tube Daily  Continuous Infusions:  feeding supplement (OSMOLITE 1.5 CAL) Stopped (09/20/24 0000)   meropenem (MERREM) IV 1 g (09/20/24 0547)   vancomycin  1,500 mg (09/20/24 1047)    PRN Meds: acetaminophen  **OR** acetaminophen  (TYLENOL ) oral liquid 160 mg/5 mL **OR** acetaminophen , ibuprofen , ipratropium-albuterol , lip balm, morphine  injection, mouth rinse  Physical Exam          Vital Signs: BP (!) 130/52 (BP Location: Right Arm)   Pulse 91   Temp 99.9 F (37.7 C) (Axillary)   Resp (!) 29   Ht 5' 5 (1.651 m)   Wt 85.8 kg   SpO2 98%   BMI 31.48 kg/m  SpO2: SpO2: 98 % O2 Device: O2 Device: Nasal Cannula O2 Flow Rate: O2 Flow Rate (L/min): 1 L/min  Intake/output summary:  Intake/Output Summary (Last 24 hours) at 09/20/2024 1255 Last data filed at 09/20/2024 0800 Gross per 24 hour  Intake 660 ml  Output 1550 ml  Net -890 ml   LBM: Last BM Date : 09/19/24 Baseline Weight: Weight: 83.5 kg Most recent weight: Weight:  (cant get weight on bed)  Patient Active Problem List   Diagnosis Date Noted   Fever 09/19/2024   Severe aortic stenosis 09/10/2024   Pneumonitis 09/10/2024   AKI (acute kidney injury) 09/09/2024   Hypernatremia 09/09/2024   Abnormal EKG 09/09/2024   SIRS (systemic inflammatory response syndrome) (HCC) 09/09/2024   Acute CVA (cerebrovascular accident) (HCC) 09/08/2024   BP (high blood pressure) 03/26/2015   Asthma, chronic 03/26/2015   Essential hypertension 03/26/2015   Obesity 03/26/2015   Sleep apnea 03/26/2015    Palliative Care Assessment & Plan    Recommendations/Plan: Patient discharging to hospice facility  Code Status:    Code Status  Orders  (From admission, onward)           Start     Ordered   09/10/24 1605  Do not attempt resuscitation (DNR) Pre-Arrest Interventions Desired  (Code Status)  Continuous       Question Answer Comment  If pulseless and not breathing No CPR or chest compressions.   In Pre-Arrest Conditions (Patient Has Pulse and Is Breathing) May intubate, use advanced airway interventions and cardioversion/ACLS medications if appropriate or indicated. May transfer to ICU.   Consent: Discussion documented in EHR or advanced directives reviewed      09/10/24 1604           Code Status History     Date Active Date Inactive Code Status Order ID Comments User Context   09/10/2024 1250 09/10/2024 1604 Full Code [id number]  Jens Durand, MD Inpatient   09/09/2024 1558 09/10/2024 1250 Limited: Do not attempt resuscitation (DNR) -DNR-LIMITED -Do Not Intubate/DNI  [id number]  Jens Durand, MD Inpatient   09/09/2024 0059 09/09/2024 1558 Full Code [id number]  Cleatus Delayne GAILS, MD Inpatient      Camelia Lewis, NP  Please contact Palliative Medicine Team phone at [phone] for questions and concerns.

## 2024-09-21 ENCOUNTER — Other Ambulatory Visit: Payer: Self-pay | Admitting: Oncology

## 2024-09-21 DIAGNOSIS — C3491 Malignant neoplasm of unspecified part of right bronchus or lung: Secondary | ICD-10-CM

## 2024-10-02 ENCOUNTER — Inpatient Hospital Stay: Admitting: Oncology

## 2024-10-02 ENCOUNTER — Inpatient Hospital Stay

## 2024-10-03 ENCOUNTER — Ambulatory Visit: Admitting: Pulmonary Disease
